# Patient Record
Sex: Female | Born: 1950
Health system: Southern US, Community
[De-identification: ages and names within clinical notes are randomized; demographics above are authoritative.]

## PROBLEM LIST (undated history)

## (undated) DIAGNOSIS — I5042 Chronic combined systolic (congestive) and diastolic (congestive) heart failure: Secondary | ICD-10-CM

## (undated) DIAGNOSIS — E785 Hyperlipidemia, unspecified: Secondary | ICD-10-CM

## (undated) DIAGNOSIS — I454 Nonspecific intraventricular block: Secondary | ICD-10-CM

## (undated) DIAGNOSIS — Z951 Presence of aortocoronary bypass graft: Secondary | ICD-10-CM

## (undated) DIAGNOSIS — F329 Major depressive disorder, single episode, unspecified: Secondary | ICD-10-CM

## (undated) DIAGNOSIS — Z95810 Presence of automatic (implantable) cardiac defibrillator: Secondary | ICD-10-CM

## (undated) DIAGNOSIS — M549 Dorsalgia, unspecified: Secondary | ICD-10-CM

## (undated) DIAGNOSIS — I10 Essential (primary) hypertension: Secondary | ICD-10-CM

## (undated) DIAGNOSIS — I255 Ischemic cardiomyopathy: Secondary | ICD-10-CM

## (undated) DIAGNOSIS — Z9981 Dependence on supplemental oxygen: Secondary | ICD-10-CM

## (undated) DIAGNOSIS — I739 Peripheral vascular disease, unspecified: Secondary | ICD-10-CM

## (undated) DIAGNOSIS — I208 Other forms of angina pectoris: Secondary | ICD-10-CM

## (undated) DIAGNOSIS — G629 Polyneuropathy, unspecified: Secondary | ICD-10-CM

## (undated) DIAGNOSIS — R7989 Other specified abnormal findings of blood chemistry: Secondary | ICD-10-CM

## (undated) DIAGNOSIS — G4730 Sleep apnea, unspecified: Secondary | ICD-10-CM

## (undated) DIAGNOSIS — I509 Heart failure, unspecified: Secondary | ICD-10-CM

## (undated) DIAGNOSIS — G4700 Insomnia, unspecified: Secondary | ICD-10-CM

## (undated) DIAGNOSIS — M1990 Unspecified osteoarthritis, unspecified site: Secondary | ICD-10-CM

## (undated) DIAGNOSIS — R945 Abnormal results of liver function studies: Secondary | ICD-10-CM

## (undated) DIAGNOSIS — M25562 Pain in left knee: Secondary | ICD-10-CM

## (undated) DIAGNOSIS — F419 Anxiety disorder, unspecified: Secondary | ICD-10-CM

## (undated) DIAGNOSIS — F32A Depression, unspecified: Secondary | ICD-10-CM

## (undated) DIAGNOSIS — I209 Angina pectoris, unspecified: Secondary | ICD-10-CM

## (undated) DIAGNOSIS — B369 Superficial mycosis, unspecified: Secondary | ICD-10-CM

## (undated) DIAGNOSIS — I472 Ventricular tachycardia: Secondary | ICD-10-CM

## (undated) DIAGNOSIS — M2550 Pain in unspecified joint: Secondary | ICD-10-CM

## (undated) DIAGNOSIS — I82409 Acute embolism and thrombosis of unspecified deep veins of unspecified lower extremity: Secondary | ICD-10-CM

## (undated) DIAGNOSIS — R0602 Shortness of breath: Secondary | ICD-10-CM

## (undated) DIAGNOSIS — B359 Dermatophytosis, unspecified: Secondary | ICD-10-CM

## (undated) DIAGNOSIS — E669 Obesity, unspecified: Secondary | ICD-10-CM

## (undated) DIAGNOSIS — H409 Unspecified glaucoma: Secondary | ICD-10-CM

## (undated) DIAGNOSIS — R600 Localized edema: Secondary | ICD-10-CM

## (undated) DIAGNOSIS — I2510 Atherosclerotic heart disease of native coronary artery without angina pectoris: Secondary | ICD-10-CM

## (undated) HISTORY — DX: Chronic combined systolic (congestive) and diastolic (congestive) heart failure: I50.42

## (undated) HISTORY — DX: Pain in left knee: M25.562

## (undated) HISTORY — DX: Localized edema: R60.0

## (undated) HISTORY — DX: Dorsalgia, unspecified: M54.9

## (undated) HISTORY — DX: Superficial mycosis, unspecified: B36.9

## (undated) HISTORY — DX: Heart failure, unspecified: I50.9

## (undated) HISTORY — DX: Obesity, unspecified: E66.9

## (undated) HISTORY — DX: Abnormal results of liver function studies: R94.5

## (undated) HISTORY — DX: Other specified abnormal findings of blood chemistry: R79.89

## (undated) HISTORY — DX: Depression, unspecified: F32.A

## (undated) HISTORY — DX: Peripheral vascular disease, unspecified: I73.9

## (undated) HISTORY — DX: Angina pectoris, unspecified: I20.9

## (undated) HISTORY — DX: Unspecified glaucoma: H40.9

## (undated) HISTORY — DX: Polyneuropathy, unspecified: G62.9

## (undated) HISTORY — DX: Ischemic cardiomyopathy: I25.5

## (undated) HISTORY — DX: Essential (primary) hypertension: I10

## (undated) HISTORY — DX: Insomnia, unspecified: G47.00

## (undated) HISTORY — DX: Anxiety disorder, unspecified: F41.9

## (undated) HISTORY — DX: Presence of aortocoronary bypass graft: Z95.1

## (undated) HISTORY — DX: Sleep apnea, unspecified: G47.30

## (undated) HISTORY — DX: Major depressive disorder, single episode, unspecified: F32.9

## (undated) HISTORY — DX: Dermatophytosis, unspecified: B35.9

## (undated) HISTORY — DX: Pain in unspecified joint: M25.50

## (undated) HISTORY — DX: Ventricular tachycardia: I47.2

## (undated) HISTORY — PX: CARDIAC CATHETERIZATION: SHX172

## (undated) HISTORY — DX: Nonspecific intraventricular block: I45.4

## (undated) HISTORY — DX: Atherosclerotic heart disease of native coronary artery without angina pectoris: I25.10

## (undated) HISTORY — DX: Other forms of angina pectoris: I20.8

## (undated) HISTORY — DX: Morbid (severe) obesity due to excess calories: E66.01

## (undated) HISTORY — DX: Hyperlipidemia, unspecified: E78.5

---
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
## 1997-04-20 ENCOUNTER — Ambulatory Visit (HOSPITAL_COMMUNITY): Admission: RE | Admit: 1997-04-20 | Discharge: 1997-04-20 | Payer: Self-pay | Admitting: Cardiology

## 1998-10-24 ENCOUNTER — Other Ambulatory Visit: Admission: RE | Admit: 1998-10-24 | Discharge: 1998-10-24 | Payer: Self-pay | Admitting: Gynecology

## 1998-10-24 ENCOUNTER — Encounter (INDEPENDENT_AMBULATORY_CARE_PROVIDER_SITE_OTHER): Payer: Self-pay | Admitting: Specialist

## 1999-04-19 ENCOUNTER — Ambulatory Visit (HOSPITAL_COMMUNITY): Admission: RE | Admit: 1999-04-19 | Discharge: 1999-04-19 | Payer: Self-pay | Admitting: Cardiology

## 1999-05-23 ENCOUNTER — Encounter (HOSPITAL_COMMUNITY): Admission: RE | Admit: 1999-05-23 | Discharge: 1999-06-04 | Payer: Self-pay | Admitting: Cardiology

## 1999-06-05 ENCOUNTER — Encounter (HOSPITAL_COMMUNITY): Admission: RE | Admit: 1999-06-05 | Discharge: 1999-07-06 | Payer: Self-pay | Admitting: Cardiology

## 2001-02-03 ENCOUNTER — Other Ambulatory Visit: Admission: RE | Admit: 2001-02-03 | Discharge: 2001-02-03 | Payer: Self-pay | Admitting: Gynecology

## 2001-10-18 ENCOUNTER — Emergency Department (HOSPITAL_COMMUNITY): Admission: EM | Admit: 2001-10-18 | Discharge: 2001-10-18 | Payer: Self-pay | Admitting: *Deleted

## 2001-10-18 ENCOUNTER — Encounter: Payer: Self-pay | Admitting: *Deleted

## 2002-02-06 ENCOUNTER — Emergency Department (HOSPITAL_COMMUNITY): Admission: EM | Admit: 2002-02-06 | Discharge: 2002-02-06 | Payer: Self-pay

## 2003-11-29 ENCOUNTER — Emergency Department (HOSPITAL_COMMUNITY): Admission: EM | Admit: 2003-11-29 | Discharge: 2003-11-30 | Payer: Self-pay | Admitting: Emergency Medicine

## 2004-02-08 ENCOUNTER — Emergency Department (HOSPITAL_COMMUNITY): Admission: EM | Admit: 2004-02-08 | Discharge: 2004-02-08 | Payer: Self-pay | Admitting: Family Medicine

## 2005-08-20 ENCOUNTER — Emergency Department (HOSPITAL_COMMUNITY): Admission: EM | Admit: 2005-08-20 | Discharge: 2005-08-20 | Payer: Self-pay | Admitting: Family Medicine

## 2005-10-03 ENCOUNTER — Inpatient Hospital Stay (HOSPITAL_COMMUNITY): Admission: EM | Admit: 2005-10-03 | Discharge: 2005-10-11 | Payer: Self-pay | Admitting: Emergency Medicine

## 2005-10-03 ENCOUNTER — Encounter (INDEPENDENT_AMBULATORY_CARE_PROVIDER_SITE_OTHER): Payer: Self-pay | Admitting: Cardiology

## 2005-10-04 ENCOUNTER — Encounter (INDEPENDENT_AMBULATORY_CARE_PROVIDER_SITE_OTHER): Payer: Self-pay | Admitting: *Deleted

## 2005-10-05 DIAGNOSIS — Z951 Presence of aortocoronary bypass graft: Secondary | ICD-10-CM | POA: Insufficient documentation

## 2005-10-05 HISTORY — DX: Presence of aortocoronary bypass graft: Z95.1

## 2005-10-05 HISTORY — PX: CORONARY ARTERY BYPASS GRAFT: SHX141

## 2005-10-25 ENCOUNTER — Encounter (HOSPITAL_COMMUNITY): Admission: RE | Admit: 2005-10-25 | Discharge: 2006-01-23 | Payer: Self-pay | Admitting: Cardiology

## 2006-01-24 ENCOUNTER — Encounter (HOSPITAL_COMMUNITY): Admission: RE | Admit: 2006-01-24 | Discharge: 2006-04-23 | Payer: Self-pay | Admitting: Cardiology

## 2006-04-09 HISTORY — PX: CARDIAC DEFIBRILLATOR PLACEMENT: SHX171

## 2006-04-26 ENCOUNTER — Encounter: Admission: RE | Admit: 2006-04-26 | Discharge: 2006-04-26 | Payer: Self-pay | Admitting: Cardiology

## 2006-05-01 ENCOUNTER — Inpatient Hospital Stay (HOSPITAL_COMMUNITY): Admission: RE | Admit: 2006-05-01 | Discharge: 2006-05-02 | Payer: Self-pay | Admitting: Cardiology

## 2008-02-03 ENCOUNTER — Emergency Department (HOSPITAL_COMMUNITY): Admission: EM | Admit: 2008-02-03 | Discharge: 2008-02-03 | Payer: Self-pay | Admitting: Emergency Medicine

## 2008-07-08 ENCOUNTER — Emergency Department (HOSPITAL_COMMUNITY): Admission: EM | Admit: 2008-07-08 | Discharge: 2008-07-08 | Payer: Self-pay | Admitting: Emergency Medicine

## 2009-03-07 ENCOUNTER — Encounter: Admission: RE | Admit: 2009-03-07 | Discharge: 2009-03-07 | Payer: Self-pay | Admitting: Internal Medicine

## 2009-03-24 ENCOUNTER — Encounter: Admission: RE | Admit: 2009-03-24 | Discharge: 2009-03-24 | Payer: Self-pay | Admitting: Internal Medicine

## 2009-03-24 ENCOUNTER — Other Ambulatory Visit: Admission: RE | Admit: 2009-03-24 | Discharge: 2009-03-24 | Payer: Self-pay | Admitting: Interventional Radiology

## 2009-10-24 ENCOUNTER — Emergency Department (HOSPITAL_COMMUNITY): Admission: EM | Admit: 2009-10-24 | Discharge: 2009-10-24 | Payer: Self-pay | Admitting: Emergency Medicine

## 2009-12-18 ENCOUNTER — Inpatient Hospital Stay (HOSPITAL_COMMUNITY)
Admission: EM | Admit: 2009-12-18 | Discharge: 2009-12-22 | Payer: Self-pay | Source: Home / Self Care | Attending: Internal Medicine | Admitting: Internal Medicine

## 2009-12-19 ENCOUNTER — Encounter (INDEPENDENT_AMBULATORY_CARE_PROVIDER_SITE_OTHER): Payer: Self-pay | Admitting: Internal Medicine

## 2009-12-20 ENCOUNTER — Ambulatory Visit (HOSPITAL_COMMUNITY)
Admission: RE | Admit: 2009-12-20 | Discharge: 2009-12-20 | Payer: Self-pay | Source: Home / Self Care | Attending: Cardiology | Admitting: Cardiology

## 2010-01-15 ENCOUNTER — Emergency Department (HOSPITAL_COMMUNITY)
Admission: EM | Admit: 2010-01-15 | Discharge: 2010-01-15 | Payer: Self-pay | Source: Home / Self Care | Admitting: Family Medicine

## 2010-01-24 ENCOUNTER — Ambulatory Visit (HOSPITAL_BASED_OUTPATIENT_CLINIC_OR_DEPARTMENT_OTHER)
Admission: RE | Admit: 2010-01-24 | Discharge: 2010-01-24 | Payer: Self-pay | Source: Home / Self Care | Attending: Internal Medicine | Admitting: Internal Medicine

## 2010-02-17 ENCOUNTER — Other Ambulatory Visit: Payer: Self-pay | Admitting: Internal Medicine

## 2010-02-17 DIAGNOSIS — E049 Nontoxic goiter, unspecified: Secondary | ICD-10-CM

## 2010-02-24 ENCOUNTER — Other Ambulatory Visit: Payer: Self-pay

## 2010-02-24 ENCOUNTER — Ambulatory Visit
Admission: RE | Admit: 2010-02-24 | Discharge: 2010-02-24 | Disposition: A | Discharge status: Home / Self Care | Payer: Medicaid Other | Source: Ambulatory Visit | Attending: Internal Medicine | Admitting: Internal Medicine

## 2010-02-24 DIAGNOSIS — E049 Nontoxic goiter, unspecified: Secondary | ICD-10-CM

## 2010-03-20 LAB — BASIC METABOLIC PANEL
BUN: 21 mg/dL (ref 6–23)
BUN: 11 mg/dL (ref 6–23)
BUN: 15 mg/dL (ref 6–23)
BUN: 9 mg/dL (ref 6–23)
CO2: 28 mEq/L (ref 19–32)
CO2: 24 mEq/L (ref 19–32)
CO2: 28 mEq/L (ref 19–32)
CO2: 28 mEq/L (ref 19–32)
Calcium: 9.5 mg/dL (ref 8.4–10.5)
Calcium: 9 mg/dL (ref 8.4–10.5)
Calcium: 9.1 mg/dL (ref 8.4–10.5)
Calcium: 9.1 mg/dL (ref 8.4–10.5)
Calcium: 9.3 mg/dL (ref 8.4–10.5)
Chloride: 104 mEq/L (ref 96–112)
Creatinine, Ser: 1.12 mg/dL (ref 0.4–1.2)
Creatinine, Ser: 0.82 mg/dL (ref 0.4–1.2)
GFR calc Af Amer: >60 mL/min (ref >60)
GFR calc non Af Amer: 50 mL/min — ABNORMAL LOW (ref >60)
GFR calc non Af Amer: 57 mL/min — ABNORMAL LOW (ref >60)
GFR calc non Af Amer: >60 mL/min (ref >60)
GFR calc non Af Amer: >60 mL/min (ref >60)
Glucose, Bld: 108 mg/dL — ABNORMAL HIGH (ref 70–99)
Glucose, Bld: 112 mg/dL — ABNORMAL HIGH (ref 70–99)
Glucose, Bld: 173 mg/dL — ABNORMAL HIGH (ref 70–99)
Glucose, Bld: 232 mg/dL — ABNORMAL HIGH (ref 70–99)
Potassium: 3.7 mEq/L (ref 3.5–5.1)
Potassium: 4.4 mEq/L (ref 3.5–5.1)
Sodium: 133 mEq/L — ABNORMAL LOW (ref 135–145)
Sodium: 139 mEq/L (ref 135–145)

## 2010-03-20 LAB — POCT CARDIAC MARKERS
CKMB, poc: 1.5 ng/mL (ref 1.0–8.0)
Myoglobin, poc: 83.7 ng/mL (ref 12–200)
Troponin i, poc: <0.05 ng/mL (ref 0.00–0.09)

## 2010-03-20 LAB — GLUCOSE, CAPILLARY
Glucose-Capillary: 129 mg/dL — ABNORMAL HIGH (ref 70–99)
Glucose-Capillary: 164 mg/dL — ABNORMAL HIGH (ref 70–99)
Glucose-Capillary: 180 mg/dL — ABNORMAL HIGH (ref 70–99)
Glucose-Capillary: 208 mg/dL — ABNORMAL HIGH (ref 70–99)
Glucose-Capillary: 248 mg/dL — ABNORMAL HIGH (ref 70–99)
Glucose-Capillary: 250 mg/dL — ABNORMAL HIGH (ref 70–99)
Glucose-Capillary: 305 mg/dL — ABNORMAL HIGH (ref 70–99)
Glucose-Capillary: 91 mg/dL (ref 70–99)

## 2010-03-20 LAB — CARDIAC PANEL(CRET KIN+CKTOT+MB+TROPI)
CK, MB: 1.6 ng/mL (ref 0.3–4.0)
Relative Index: 1.5 (ref 0.0–2.5)
Total CK: 108 U/L (ref 7–177)
Troponin I: 0.02 ng/mL (ref 0.00–0.06)

## 2010-03-20 LAB — URINALYSIS, ROUTINE W REFLEX MICROSCOPIC
Hgb urine dipstick: NEGATIVE
Protein, ur: NEGATIVE mg/dL
Protein, ur: NEGATIVE mg/dL
Specific Gravity, Urine: 1.016 (ref 1.005–1.030)
Urobilinogen, UA: 0.2 mg/dL (ref 0.0–1.0)
Urobilinogen, UA: 0.2 mg/dL (ref 0.0–1.0)

## 2010-03-20 LAB — TROPONIN I: Troponin I: 0.03 ng/mL (ref 0.00–0.06)

## 2010-03-20 LAB — URINE CULTURE: Special Requests: NEGATIVE

## 2010-03-20 LAB — DIFFERENTIAL
Basophils Relative: 0 % (ref 0–1)
Eosinophils Absolute: 0.1 10*3/uL (ref 0.0–0.7)
Eosinophils Relative: 2 % (ref 0–5)
Lymphs Abs: 3 10*3/uL (ref 0.7–4.0)
Monocytes Relative: 8 % (ref 3–12)
Neutro Abs: 3.9 10*3/uL (ref 1.7–7.7)
Neutrophils Relative %: 51 % (ref 43–77)
Neutrophils Relative %: 51 % (ref 43–77)

## 2010-03-20 LAB — URINE MICROSCOPIC-ADD ON

## 2010-03-20 LAB — LIPID PANEL
HDL: 28 mg/dL — ABNORMAL LOW (ref >39)
Total CHOL/HDL Ratio: 4.6 RATIO
VLDL: 19 mg/dL (ref 0–40)

## 2010-03-20 LAB — CBC
HCT: 39 % (ref 36.0–46.0)
HCT: 40.2 % (ref 36.0–46.0)
Hemoglobin: 13.4 g/dL (ref 12.0–15.0)
MCH: 30.6 pg (ref 26.0–34.0)
MCH: 30.9 pg (ref 26.0–34.0)
MCH: 31 pg (ref 26.0–34.0)
MCHC: 32.8 g/dL (ref 30.0–36.0)
MCHC: 33.1 g/dL (ref 30.0–36.0)
MCHC: 33.3 g/dL (ref 30.0–36.0)
MCV: 93.1 fL (ref 78.0–100.0)
Platelets: 181 10*3/uL (ref 150–400)
Platelets: 211 10*3/uL (ref 150–400)
RBC: 4.38 MIL/uL (ref 3.87–5.11)
RDW: 14.2 % (ref 11.5–15.5)
RDW: 14.2 % (ref 11.5–15.5)
WBC: 6.9 10*3/uL (ref 4.0–10.5)

## 2010-03-20 LAB — BRAIN NATRIURETIC PEPTIDE
Pro B Natriuretic peptide (BNP): 266 pg/mL — ABNORMAL HIGH (ref 0.0–100.0)
Pro B Natriuretic peptide (BNP): <30 pg/mL (ref 0.0–100.0)

## 2010-03-20 LAB — CK TOTAL AND CKMB (NOT AT ARMC): Relative Index: 1.7 (ref 0.0–2.5)

## 2010-03-20 LAB — MAGNESIUM: Magnesium: 2.2 mg/dL (ref 1.5–2.5)

## 2010-03-20 LAB — HEMOGLOBIN A1C: Mean Plasma Glucose: 206 mg/dL — ABNORMAL HIGH (ref <117)

## 2010-03-20 LAB — PROTIME-INR
INR: 1.09 (ref 0.00–1.49)
Prothrombin Time: 14.3 seconds (ref 11.6–15.2)

## 2010-03-22 LAB — POCT CARDIAC MARKERS
CKMB, poc: <1 ng/mL — ABNORMAL LOW (ref 1.0–8.0)
Myoglobin, poc: 88.5 ng/mL (ref 12–200)

## 2010-03-22 LAB — POCT I-STAT, CHEM 8
Creatinine, Ser: 0.8 mg/dL (ref 0.4–1.2)
HCT: 45 % (ref 36.0–46.0)
Hemoglobin: 15.3 g/dL — ABNORMAL HIGH (ref 12.0–15.0)
Potassium: 3.5 mEq/L (ref 3.5–5.1)
Sodium: 141 mEq/L (ref 135–145)
TCO2: 26 mmol/L (ref 0–100)

## 2010-03-22 LAB — GLUCOSE, CAPILLARY: Glucose-Capillary: 208 mg/dL — ABNORMAL HIGH (ref 70–99)

## 2010-03-22 LAB — BRAIN NATRIURETIC PEPTIDE: Pro B Natriuretic peptide (BNP): 49 pg/mL (ref 0.0–100.0)

## 2010-03-24 ENCOUNTER — Encounter (INDEPENDENT_AMBULATORY_CARE_PROVIDER_SITE_OTHER): Payer: Self-pay | Admitting: *Deleted

## 2010-03-28 NOTE — Letter (Signed)
Summary: Appointment - Reminder 2  [employer], Main Office  [address]   [place], [place] [address]   Phone: [phone]  Fax: [phone]     March 24, 2010 MRN: [id number]   [name] [address] [place], [place]  [address]   Dear [name],  Our records indicate that it is time to schedule a follow-up appointment.  Dr.Allred recommended that you follow up with [name] in May. It is very important that we reach you to schedule this appointment. We look forward to participating in your health care needs. Please contact us at the number listed above at your earliest convenience to schedule your appointment.  If you are unable to make an appointment at this time, give [name] a call so we can update our records.     Sincerely,   Glass blower/designer

## 2010-04-24 LAB — CBC
MCHC: 32.8 g/dL (ref 30.0–36.0)
MCV: 90.8 fL (ref 78.0–100.0)
RBC: 4.63 MIL/uL (ref 3.87–5.11)
RDW: 13.8 % (ref 11.5–15.5)

## 2010-04-24 LAB — DIFFERENTIAL
Eosinophils Relative: 2 % (ref 0–5)
Lymphocytes Relative: 19 % (ref 12–46)
Lymphs Abs: 1.1 10*3/uL (ref 0.7–4.0)
Monocytes Relative: 8 % (ref 3–12)
Neutrophils Relative %: 71 % (ref 43–77)

## 2010-04-24 LAB — COMPREHENSIVE METABOLIC PANEL
AST: 20 U/L (ref 0–37)
CO2: 27 mEq/L (ref 19–32)
Calcium: 9 mg/dL (ref 8.4–10.5)
Creatinine, Ser: 1.13 mg/dL (ref 0.4–1.2)
GFR calc Af Amer: >60 mL/min (ref >60)
GFR calc non Af Amer: 50 mL/min — ABNORMAL LOW (ref >60)
Total Protein: 7.5 g/dL (ref 6.0–8.3)

## 2010-04-24 LAB — URINALYSIS, ROUTINE W REFLEX MICROSCOPIC
Ketones, ur: 15 mg/dL — AB
Nitrite: NEGATIVE
Protein, ur: 30 mg/dL — AB

## 2010-04-24 LAB — POCT CARDIAC MARKERS
Myoglobin, poc: 187 ng/mL (ref 12–200)
Troponin i, poc: <0.05 ng/mL (ref 0.00–0.09)

## 2010-05-26 NOTE — Cardiovascular Report (Signed)
[facility]  Patient:    [name], [name]                         MRN: [id number] Proc. Date: 04/30/99 Attending:  Aram Candela. Aleen Campi, M.D. CC:         Aram Candela. Tysinger, M.D. (office)             Cath. Lab. @ [facility]                        Cardiac Catheterization  REFERRING PHYSICIAN:  Dr. Aggie Cosier.  SURGEON:  Dr. Charolette Child.  PROCEDURES: 1. Left heart catheterization. 2. Coronary cineangiography. 3. Left ventricular cineangiography. 4. Abdominal aortic angiogram. 5. Perclose of the right femoral artery.  INDICATIONS FOR PROCEDURES:  This 60 year old diabetic female has a history of single-vessel coronary artery disease documented with prior cardiac catheterization in 1995 and 1999.  At both occasions, she had diffuse severe disease of her mid- and distal LAD and was continued on medical therapy.  Her last catheterization in 1999 showed a 50% lesion in her distal right coronary artery.  She now returns after continuation of her angina and a stress test showed evidence for a new area of reversible ischemia in her inferior myocardium.  She also has a history of hypertension.  DESCRIPTION OF THE PROCEDURE:  After signing an informed consent, the patient was premedicated with 50 mg of Benadryl intravenously and brought to the cardiac catheterization lab.  Her right groin was prepped and draped in a sterile fashion and anesthetized locally with 1% lidocaine.  A #6 French introducer sheath was inserted percutaneously into the right femoral artery. A #6 [place] #4 Judkins coronary catheters were used to make injections in to the coronary arteries.  A #6 French pigtail catheter was used to measure pressures in the left ventricle and aorta and to make midstream injections in to the left ventricle and abdominal aorta.  The patient tolerated the procedure well and no complications were noted.  At the end of the procedure, the catheter and  sheath were removed from the right femoral artery and hemostasis was easily obtained with a Perclose closure system.  MEDICATIONS GIVEN:  None.  HEMODYNAMIC DATA:  Left ventricular pressure 164/0-18, aortic pressure 100/64-88 with a mean of 118.  Left ventricular ejection fraction was estimated at between 50 and 60%.  CINE FINDINGS:  CINEANGIOGRAPHY:  LEFT CORONARY ARTERY:  The ostium and left main appear normal.  LEFT ANTERIOR DESCENDING:  There is diffuse disease throughout the mid- and distal LAD extending almost to the apex.  There is a focal 80% stenosis in the middle segment after the second anterolateral branch.  This is followed by a segmental severe 90-95% stenosis throughout the remainder of the middle segment and with several areas of 80-90% in the distal segment.  There appears to be a short area of relative disease-free LAD in the mid- to distal segment. There also appears to be collateral flow from a diagonal to an apical region.  CIRCUMFLEX CORONARY ARTERY:  The circumflex appears normal.  The first obtuse marginal branch has mild lesion of approximately 20%.  RIGHT CORONARY ARTERY:  The ostium appears normal.  There is minor irregularities in the middle segment.  The distal segment between the acute angle and posterior descending has a focal 50-60% stenosis.  The distal segment near the crux between the first and second anterolateral branches  has a focal 80-90% stenosis.  LEFT VENTRICULAR CINEANGIOGRAPHY:  The left ventricular chamber size and contractility appear normal.  The ejection fraction was estimated at between 50 and 60%.  The mitral and aortic valves appear normal.  ABDOMINAL AORTIC ANGIOGRAM:  The abdominal and aorta and renal arteries appear normal.  FINAL DIAGNOSES: 1. Two-vessel coronary artery disease with severe and critical diffuse    stenosis of the mid- and distal left anterior descending and severe    stenosis, 80%, of the distal right  coronary artery. 2. Good left ventricular function. 3. Normal abdominal aorta and renal arteries. 4. Successful Perclose of the right femoral artery.  DISPOSITION:  Will ask CVTS to review and await their recommendation concerning possible coronary artery bypass graft surgery.  Otherwise will continue medical treatment at this time. DD:  04/19/99 TD:  04/20/99 Job: [id number]

## 2010-05-26 NOTE — Discharge Summary (Signed)
[name], [name] NO.:  [id number]   MEDICAL RECORD NO.:  [id number]          PATIENT TYPE:  INP   LOCATION:  4710                         FACILITY:  [facility]   PHYSICIAN:  Francisca December, M.D.  DATE OF BIRTH:  22-Mar-1950   DATE OF ADMISSION:  05/01/2006  DATE OF DISCHARGE:  05/02/2006                               DISCHARGE SUMMARY   DISCHARGE DIAGNOSES:  1. Ischemic cardiomyopathy, MADIT-2 criteria, status post AICD      implantation on May 01, 2006.  2. Known coronary artery disease with history of coronary artery      bypass grafting in 2007 with the following grafts:  LIMA to the      second diagonal, saphenous vein graft to the RCA, saphenous vein      graft to the right coronary, sequential saphenous vein graft to OM1      and OM2.  3. History of anterior wall myocardial infarction.  4. Hypertension.  5. Diabetes mellitus.  6. Hyperlipidemia.  7. Long-term medication use.   Ms. Wolfer is a well-known patient to our practice with significant  coronary artery disease.  She has undergone bypass surgery and indeed  has an ischemic cardiomyopathy.  She was seen in consultation by Dr.  Amil Amen who found that she would need an AICD under the MADIT-2  criteria.   The patient was admitted on April 30, 2006 and underwent dual chamber  ICD implantation, [employer]tronic type, tolerating it well.  The patient  remained in the hospital overnight.  The ICD was evaluated and was  normal.  The patient had an x-ray done that showed no pneumothorax and  was discharged to home in stable condition.   DISCHARGE MEDICATIONS:  1. Enteric coated aspirin 325 mg a day.  2. Norvasc 5 mg a day.  3. Carvedilol 25 mg b.i.d.  4. Vytorin 10/40 one daily.  5. Fish oil daily.  6. Iron daily.  7. Narcan p.r.n.  8. Lasix 40 mg a day.  9. Benazepril 40 mg a day.  10.Zofran 4 mg every 6 hours p.r.n. nausea.   Activity care and wound care sheets were given to the patient.  She  will  remain on a low-sodium, heart-healthy diet and follow up with Dr.  Clydene Laming on May 10, 2006 at 10:30 a.m.  The patient is discharged to  home in stable but improved condition.      Guy Franco, P.A.      Francisca December, M.D.  Electronically Signed    LB/MEDQ  D:  06/24/2006  T:  06/24/2006  Job:  [id number]   cc:   Francisca December, M.D.  Georgann Housekeeper, MD

## 2010-05-26 NOTE — Op Note (Signed)
NAMETAMURA, LASKY NO.:  [id number]   MEDICAL RECORD NO.:  [id number]          PATIENT TYPE:  INP   LOCATION:  2313                         FACILITY:  [facility]   PHYSICIAN:  Evelene Croon, M.D.     DATE OF BIRTH:  12-17-1950   DATE OF PROCEDURE:  10/05/2005  DATE OF DISCHARGE:                                 OPERATIVE REPORT   PREOPERATIVE DIAGNOSIS:  Severe three-vessel coronary artery disease, status  post acute anterior myocardial infarction.   POSTOPERATIVE DIAGNOSIS:  Severe three-vessel coronary artery disease,  status post acute anterior myocardial infarction.   OPERATIVE PROCEDURE:  Median sternotomy, extracorporeal circulation,  coronary bypass graft surgery x5, using a left internal mammary artery graft  to the second diagonal branch of the LAD, a saphenous vein graft to the  distal right coronary artery, a saphenous vein graft to the intermediate  coronary artery, and a sequential saphenous vein graft to the first and  second obtuse marginal branches of the left circumflex coronary artery.  Endoscopic vein harvesting from both legs.   SURGEON:  Evelene Croon, M.D.   ASSISTANT:  Salvatore Decent. Cornelius Moras, M.D.   SECOND ASSISTANT:  Jerold Coombe, P.A.-C.   ANESTHESIA:  General endotracheal.   CLINICAL HISTORY:  This patient is a 60 year old woman with diabetes and  hypertension that are poorly controlled, who has a history of coronary  artery disease, status post multiple prior catheterizations which have shown  severe LAD and right coronary disease.  She has been treated medically.  She  was admitted after developing acute onset of chest pain and dyspnea  awakening her about 3:00 in the morning.  The chest pain continued about 4  hours before she came to the emergency room, where the pain was relieved  with sublingual nitroglycerin.  She ruled in for acute anterior MI, with CPK  of 213, with MB of 16.9 and troponin of 1.63.  The glucose on  presentation  was 397.  She had new Q waves on her EKG anteriorly compared to 2005.  She  underwent cardiac catheterization which showed severe diffuse three-vessel  coronary artery disease.  The LAD was severely diseased throughout.  There  was a small first diagonal branch and then a larger branching second  diagonal which had an 80% ostial stenosis.  Both sub-branches appeared to  communicate with each other.  This was a main supply to the anterolateral  wall.  The distal LAD had a small third diagonal and then was completely  occluded, with faint filling of the apical portion by collaterals.  The  entire LAD did not appear graftable.  There was also a moderate-sized  intermediate vessel that had about 95% proximal stenosis.  There were two  other marginal branches that had significant stenoses.  The right coronary  artery was occluded, with filling of the distal vessel by collaterals from  the left.  Left ventricular ejection fraction was about 30%.  End-diastolic  pressure was elevated to about 38.  After review of the angiogram and  examination of the patient, it was felt that coronary  artery bypass graft  surgery was the best treatment to prevent further ischemia and infarction.  I discussed the operative procedure with the patient and her daughter,  including alternatives, benefits, and risks, including bleeding, blood  transfusion, infection, stroke, myocardial infarction, graft failure, organ  failure, and death.  I also discussed the increased risks due to her  hemoglobin A1c, which was over 14.  I discussed the importance of cardiac  risk factor reduction, including good control of her diabetes and  hypertension, as well as control of hyperlipidemia and weight loss.  She  understood all of this and agreed to proceed with surgery.   OPERATIVE PROCEDURE:  The patient was taken to the operating room and placed  on the table in the supine position.  After induction of general   endotracheal anesthesia, a Foley catheter was placed in the bladder in  sterile technique.  Then, the chest, abdomen, and both lower extremities  were prepped and draped in the usual sterile manner.  Transesophageal  echocardiogram was performed by anesthesiology.  This showed trace mitral  regurgitation.  There was severe left ventricular dysfunction, with global  hypokinesis.  There was left ventricular dilatation.  The ejection fraction  was estimated at about 25%.  The patient had moderate pulmonary hypertension  prior to anesthesia, which gradually improved after being put to sleep.  The  aortic valve appeared to have no stenosis or regurgitation.  The right heart  was functioning normally, with no tricuspid regurgitation.   Then, the chest was opened through a median sternotomy incision in the  pericardium at the midline.  Examination of the heart showed a large heart  that filled the pericardial cavity.  The right ventricle appeared to  contract well.  The left ventricle was not visible.  The ascending aorta had  no palpable plaques in it.   Then, the left internal mammary artery was harvested from the chest wall as  a pedicle graft.  This was a medium-caliber vessel, with excellent blood  flow through it.  At the same time, a segment of greater saphenous vein was  harvested from the right leg using endoscopic vein harvest technique.  Part  of this vein was suitable, but most of it was thick-walled and would not  dilate, and it was not felt to be suitable.  Therefore, another section of  saphenous vein was harvested from the left leg using endoscopic vein harvest  technique, and this vein was of better quality and good size.   Then, the patient was heparinized. When an adequate activated clotting time  was achieved, the distal ascending aorta was cannulated using a 20-French  aortic cannula for arterial inflow.  Venous outflow was achieved using a two- stage venous cannula at the  right atrial appendage.  An antegrade  cardioplegia and vent cannula was inserted in the aortic root.  A retrograde  cardioplegia cannula was inserted through the right atrium into the coronary  sinus.   The patient was placed on cardiopulmonary bypass, and the distal coronaries  were identified.  The LAD was severely and diffusely diseased and not  graftable.  The first diagonal was a small, non-graftable vessel.  The  second diagonal had two sub-branches.  The more lateral branch was actually  a little larger.  There was mild distal disease in both sub-branches.  The  intermediate vessel was heavily diseased proximally, and a moderate-size  vessel distally where it was graftable before it came intramyocardial.  The  first  and second marginal were moderate-size graftable vessels.  The right  coronary was diffusely diseased.  There was a small posterior descending,  and then a larger posterolateral branch which was suitable for grafting.  The posterior descending itself was severely and diffusely diseased.   Then, the aorta was cross-clamped, and 500 cc of cold blood antegrade  cardioplegia was administered in the aortic root, with quick arrest of the  heart.  This was followed by 300 cc of cold blood retrograde cardioplegia.  Additional doses of retrograde cardioplegia were given at about 20-minute  intervals to maintain myocardial temperature at around 10 degrees  centigrade.  Systemic hypothermia to 28 degrees centigrade and topical  hypothermic iced saline was used.  A temperature probe was placed in the  septum and an insulating pad in the pericardium.   The first distal anastomosis was performed to the posterolateral branch of  the right coronary artery.  The internal diameter of this vessel was about  1.6 mm.  the conduit used was a segment of greater saphenous vein.  The  anastomosis was performed in an end-to-side manner using a continuous 7-0  Prolene suture.  Flow was noted  through the graft and was excellent.   The second distal anastomosis was performed to the inner intermediate  coronary.  The internal diameter was about 1.5 mm.  The conduit used was a  second segment of greater saphenous vein.  The anastomosis was performed in  an end-to-side manner using continuous 7-0 Prolene suture.  Flow was noted  through this graft and was excellent.   A third distal anastomosis was performed to the first marginal branch.  The  internal diameter was 1.5 mm.  The conduit used was a third segment of  greater saphenous vein.  The anastomosis was performed in a sequential side-  to-side manner continuous 7-0 Prolene suture.  Flow was noted through the  graft and was excellent.   The fourth distal anastomosis was performed to the second marginal branch.  The internal diameter was also 1.5 mm.  The conduit used was the same  segment of greater saphenous vein, and the anastomosis was performed in a sequential end-to-side manner continuous 7-0 Prolene suture.  Flow was noted  through the graft and was excellent.  Then, another dose of cardioplegia was  given down vein grafts and in a retrograde manner.   The fifth distal anastomosis was performed to the second diagonal branch.  The internal diameter was 1.75 mm.  The conduit used was the left internal  mammary graft, and this was brought through an opening in the left  pericardium anterior to the phrenic nerve.  It was anastomosed to the LAD in  an end-to-side manner using continuous 8-0 Prolene suture.  The pedicle was  sutured to the epicardium with 6-0 Prolene sutures.  The patient was  rewarmed to 37 degrees centigrade.  With the cross-clamp in place, the three  proximal vein graft anastomoses were performed to the aortic root in an end-  to-side manner using continuous 6-0 Prolene suture.  Then, the clamp was  removed from the mammary pedicle.  The cross-clamp was removed at a time of  101 minutes.  There was  spontaneous return of ventricular fibrillation.  The  patient was defibrillated into sinus rhythm.   The proximal and distal anastomoses appeared hemostatic and __________  satisfactory.  Graft markers were placed around the proximal anastomoses.  Two temporary right ventricular and atrial pacing wires were placed and  brought out through the skin.   When the patient had rewarmed to 37 degrees centigrade, she was weaned from  cardiopulmonary bypass on low-dose dopamine and milrinone.  Total bypass  time was 131 minutes.  A transesophageal echocardiogram showed the left  ventricular function slightly improved.  There was trace mitral  regurgitation.  Cardiac output was 5 liters per minute.  Protamine was  given, and the venous and aortic cannulas were removed without difficulty.  Hemostasis was achieved.  Three chest tubes were placed, with a tube in the  post pericardium, one in the left pleural space, and one in the anterior  mediastinum.  The sternum was then closed with double #6 stainless steel  wires.  Fascia was closed with continuous #1 Vicryl suture.  Subcutaneous  tissue was closed with continuous 2-0 Vicryl, and the skin with a 3-0 Vicryl  subcuticular closure.  The lower extremity vein harvest sites were closed in  layers in a similar manner.  The sponge, needle, and instrument counts were  correct according to the scrub nurse.  Dry sterile dressings were applied  over the incisions around the chest tubes which were hooked to Pleur-Evac  suction.  The patient remained hemodynamically stable and was transported to  the SICU in guarded but stable condition.      Evelene Croon, M.D.  Electronically Signed     BB/MEDQ  D:  10/05/2005  T:  10/08/2005  Job:  [id number]   cc:   Francisca December, M.D.  CVTS Office  Cardiac Catheterization Lab

## 2010-05-26 NOTE — Op Note (Signed)
NAMEKAITRIN, Jamie NO.:  [id number]   MEDICAL RECORD NO.:  [id number]          PATIENT TYPE:  INP   LOCATION:  2313                         FACILITY:  [facility]   PHYSICIAN:  Burna Forts, M.D.DATE OF BIRTH:  [birth year]   DATE OF PROCEDURE:  10/05/2005  DATE OF DISCHARGE:                                 OPERATIVE REPORT   PROCEDURE:  Intraoperative transesophageal echocardiography.   INDICATIONS FOR PROCEDURE:  [name] is a 60 year old female who presents  today for coronary artery bypass grafting.  She is known to have  significantly diminished cardiac function and we have been asked to place a  TEE probe for evaluation of cardiac structures and function.  On arrival to  the OR and after induction of general anesthesia, the TEE probe was  lubricated and protected and passed oropharyngeally into stomach, and  prepared for imaging of the cardiac structures.   PRE CARDIOPULMONARY BYPASS TEE EXAMINATION:  Left ventricle:  This is a  remarkably diminished left ventricular chamber seen in a short-axis view.  There is a dilated left ventricular end-diastolic volume area noted.  Estimated ejection fraction is only about 28% to 30%.  There are hypokinetic  areas and nearly akinetic areas anteriorly, hypokinetic areas intraseptally,  inferior and septally.  Long-axis views from the short-axis area also  reveals significantly diminished to nearly akinetic anterior wall, severely  hypokinetic posterior wall, and hypokinetic apical area of this heart.  Papillary muscles are well outlined.  There are no other masses noted  within.   Mitral valve:  This is thickened mitral valve apparatus, degenerative in  appearance slightly.  The leaflets are, both the anterior and posterior,  pretty coapt appropriately during systolic ejection and move appropriately.  Pulsed wave Doppler reveals severe E to A reversal, indicating nearly  restrictive end-flow and diminished LV  end-diastolic function.  Doppler  examination across this valve reveals trace mitral regurgitant flow seen in  several views.   Left atrium:  Left atrial chamber is somewhat enlarged.  There is a  significantly bulging aspect of the interatrial septum bulging left to  right.  Some slight smoke appears in the left atrial chamber itself  and  the left atrial appendage is interrogated and is clear of any masses or  structures.   Aortic valve:  Three-cusp, normal aortic valve.   Right ventricle:  Tricuspid valve and right atrium were essentially within  normal limits, with just a mild amount of tricuspid regurgitant flow noted.   Patient is placed on cardiopulmonary bypass.  Hypothermia was begun.  Coronary artery bypass grafting is carried out by Evelene Croon.  The patient  is re-warmed.  Inotropes are begun and patient separated from  cardiopulmonary bypass with the initial attempt.   POST CARDIOPULMONARY TEE EXAMINATION:  Left ventricle:  The left ventricular  chamber is seen in both short-axis and long-axis views.  It is perhaps  mildly improved over the pre bypass period.  There still remains significant  cardiac dysfunction in this heart.  However, some areas in the anterior and  anterolateral wall appear slightly  more contractile and the septal wall  which was nearly akinetic before also appears more contractile in this post  bypass.   Mitral valve:  The area of the mitral valve is seen.  There may have been a  trace bit more mitral regurgitant flow, but minimally so.   The other structures that were interrogated remained as previously  described, and except for the left ventricular dysfunction noted pre and  somewhat post bypass, the rest of the cardiac examination was as previously  described, and patient returned to the cardiac intensive care unit in stable  condition.           ______________________________  Burna Forts, M.D.     JTM/MEDQ  D:  10/05/2005  T:   10/05/2005  Job:  [id number]

## 2010-05-26 NOTE — Consult Note (Signed)
[name], EBLE NO.:  [id number]   MEDICAL RECORD NO.:  [id number]          PATIENT TYPE:  INP   LOCATION:  3308                         FACILITY:  [facility]   PHYSICIAN:  Francisca December, M.D.  DATE OF BIRTH:  Aug 19, 1950   DATE OF CONSULTATION:  10/03/2005  DATE OF DISCHARGE:                                   CONSULTATION   REASON FOR CONSULTATION:  Chest pain.   HISTORY OF PRESENT ILLNESS:  [name] is a 60 year old woman with known  ASCVD.  A cardiac catheterization in 2001 showed severe diffuse LAD and  distal RCA disease.  She was medically managed, although a CVTS consult was  obtained.  She apparently declined surgery.  She was doing well until  approximately 0300 when she woke with severe dyspnea and substernal chest  pain that was sharp, aching and radiated to both armpits.  No associated  nausea, diaphoresis or vomiting.  Denied any palpitation, dizziness or  syncope.  She took Tums without any relief.  The chest pain continued and  then finally she came to the emergency room arriving here at 0330.  In the  emergency room, her pain was relieved with sublingual and IV nitroglycerin  as well as oxygen.  She drove herself to emergency room.   At the time of my evaluation at 1100 in the morning, she is painfree.   PAST MEDICAL HISTORY:  1. Diabetes mellitus.  2. Postmenopausal state.  3. Atherosclerotic cardiovascular disease as noted above.   SOCIAL HISTORY:  Accompanied in the emergency room this afternoon by her  daughters.  No tobacco.  No ethanol use.   CURRENT MEDICATIONS:  1. Insulin regular 5 units twice daily with meals.  2. Lantus 10 units a.m. and p.m. subcu.  3. Aspirin 325 mg p.o. daily.  4. Iron 325 mg p.o. daily.  5. Recently stopped Avandia/.   DRUG ALLERGIES:  None known.   FAMILY HISTORY:  Both parents are deceased.  There health history is  unknown.   REVIEW OF SYSTEMS:  Negative except as mentioned above.   PHYSICAL  EXAMINATION:  The blood pressure is 168/98, pulse is 100 and  regular, respiratory rate is 20, temperature 97.9.  In general, this is a  mildly obese 60 year old African American woman in no distress.  HEENT is  unremarkable.  The head is atraumatic and normocephalic.  Pupils are equal  and react to light accommodation.  Extraocular movements are intact.  Sclerae are anicteric.  Oral mucosa is pink and moist.  Tongue is not  coated.  The neck is supple without thyromegaly or masses.  The carotid  upstrokes are normal.  There is no bruit.  There is no JVD.  Her chest is  clear with adequate excursion.  Normal breath sounds are heard bilaterally.  The heart has regular rhythm.  Normal S1-S2.  No murmur, click or rub.  No  gallop.  The abdomen is soft, obese, nontender.  No hepatosplenomegaly.  Bowel sounds present throughout.  Genitalia and rectal not performed.  Extremities show full range of motion.  No  edema and distal pulses are  intact.  Neurologically, cranial nerves II-XII are intact.  Motor and  sensory are grossly intact.  Gait not tested.  Skin is warm and dry.   ACCESSORY CLINICAL DATA:  Admission hemogram is normal.  Serum electrolytes  are normal.  Glucose is 397 and she has received insulin.  Her initial CK-  MB, troponin and myoglobin are all normal.  Subsequent CK rose to 213 with a  MB of 19.9 and troponin rose to 1.63 and these were obtained at 0836.   Electrocardiogram initially showed decreased R-wave voltage in V2 but no  acute change of a diagnostic nature.  There were nonspecific ST  abnormalities.  This was at 0402.  At 1036, her electrocardiogram shows a Q-  wave in V2 and perhaps 1-mm ST-segment elevation in that lead.  Other leads  show no Q-waves or ST elevation changes.   Chest x-ray shows cardiomegaly, no acute this disorder.   ASSESSMENT:  1. Septal myocardial infarction now eight hours post initiation of chest      pain.  Currently chest painfree on IV  nitroglycerin and a single dose      of Lovenox.  2. Known atherosclerotic cardiovascular disease with severe diffuse left      anterior descending artery disease.  I have reviewed the angiograms      from 2001 and there is significant stenosis in the mid-portion just at      the first diagonal branch and then diffuse disease which extends from      the mid-portion into the distal portion.  There is a collateral from      the diagonal to the distal left anterior descending artery.  There was      no significant left circumflex disease.  There was a significant      obstruction in the 80% range in the posterolateral branch which was      large.  3. Diabetes mellitus insulin requiring with significantly elevated blood      sugar today.  4. Postmenopausal state.  5. Hypertension.   PLAN:  1. Since the patient is chest painfree and now eight hours after the      initiation of her chest discomfort and given the severe disease in the      LAD which would not be very amenable to percutaneous intervention, I am      electing to proceed with medical therapy at this point which should      include Lovenox and IV nitroglycerin per your plan.  I will add IV      Integrilin.  2. Beta-blocker began with a total of 15 mg IV over approximately one and      a half hours and then 25 mg p.o. twice daily.  3. Will need to begin a ACE inhibitor.  4. Will plan for cardiac catheterization, coronary angiography tomorrow.      Goals, risks and alternatives were discussed.  The patient states her      understanding and wishes to proceed.  5. Diabetic control will be important and will be per your plan.   Thank you very much for allowing me to assist in the care of [facility].  It has been a pleasure to do so.  I will discuss her further care with you.      Francisca December, M.D.  Electronically Signed    JHE/MEDQ  D:  10/03/2005  T:  10/05/2005  Job:  [id number]  cc:   Georgann Housekeeper, MD

## 2010-05-26 NOTE — Discharge Summary (Signed)
NAMEDESIREY, KEAHEY NO.:  [id number]   MEDICAL RECORD NO.:  [id number]          PATIENT TYPE:  INP   LOCATION:  2007                         FACILITY:  [facility]   PHYSICIAN:  Evelene Croon, M.D.     DATE OF BIRTH:  02-May-1950   DATE OF ADMISSION:  10/03/2005  DATE OF DISCHARGE:                                 DISCHARGE SUMMARY   ADMIT DIAGNOSIS:  Chest pain.   PAST MEDICAL HISTORY/DISCHARGE DIAGNOSES:  1. Pulmonary edema.  2. Malignant hypertension.  3. Diabetes mellitus.  4. Coronary artery disease, status post acute anterior myocardial      infarction, status post coronary artery bypass grafting x5.   ALLERGIES:  NO KNOWN DRUG ALLERGIES.   BRIEF HISTORY:  The patient is a 60 year old female with a history of  uncontrolled diabetes and hypertension, as well as coronary artery disease  status post multiple prior cardiac caths, which have shown severe LAD and  right coronary disease.  She had been treated medically.  The patient  presented to the emergency room on October 03, 2005 with complaints of  acute onset of chest pain and dyspnea.  In the emergency room, the pain was  relieved with sublingual nitroglycerin and she was ruled in for an acute  anterior myocardial infarction.  She was stabilized and set up for elective  cardiac cath.   HOSPITAL COURSE:  The patient was admitted via the emergency room and ruled  in for an acute internal myocardial infarction as previously stated.  She  was stabilized and taken for cardiac cath on October 04, 2005, which  revealed severe 3-vessel coronary artery disease.  Secondary to these  findings Dr. Laneta Simmers, of the CVTS service, was consulted and it was his  opinion that the patient should proceed with coronary artery bypass  grafting.  Left ventricular ejection fraction was approximately 30%.   The patient was taken to the OR on October 05, 2005 for coronary artery  bypass grafting x5.  Endoscopic vessel  harvesting was performed on the  bilateral lower extremities.  The internal mammary artery was grafted to the  second diagonal branch to the LAD, saphenous vein was grafted to the distal  right coronary, saphenous vein was grafted to the intermediate, and a  sequential saphenous vein was grafted to the first and second obtuse  marginal branches.  The patient tolerated the procedure well and was  hemodynamically stable immediately postoperatively.  The patient was  transferred from the OR to the [facility] in stable condition.  The patient was  extubated without complications and woke up from anesthesia neurologically  intact.   The patient's postoperative course has progressed as expected.  On  postoperative day 1, she was afebrile with stable vital signs and  fluctuating between normal sinus and sinus tachycardic rhythm.  Her chest x-  ray was stable.  All invasive lines and chest tubes were discontinued on a  routine manner and she tolerated this well.  All drips were weaned  according.  The patient began cardiac rehab in early postoperative course  and has continued to increase  her tolerance to a satisfactory level at this  time.   The patient's diabetes mellitus has been monitored and controlled throughout  the postoperative course by the internal medicine team.  This was poorly  controlled preoperatively, but has been well maintained throughout the  postoperative course.  She will be discharged home on continued insulin  therapy and will be followed by her primary care physician as an outpatient.   The patient has been volume overloaded postoperatively and has been diuresed  accordingly.  Of note, on the postoperative day 2, she was noted to be  somewhat __________ and lethargic with morphine and oxycodone.  These were  subsequently discontinued and she was given Tylenol with Ultram with  adequate pain control.   On postoperative day 5, the patient is afebrile with stable vital signs  and  maintaining a normal sinus rhythm.  Her diabetes mellitus is well controlled  and her bowel function has returned.  She is ambulating well.   On physical exam, cardiac is regular rate and rhythm.  The lungs revealed  decreased breath sounds in the bases.  The abdomen is benign.  There is no  significant edema present in the bilateral lower extremities and the  incisions are clean, dry and intact.  The patient is stable condition at  this time and as long as she continues to progress in the current manner,  she will be ready for discharge home within the next 1 to 2 days pending  morning round reevaluation.   LABS:  BMP on October 09, 2005:  Sodium 139, potassium 3.9, BUN 12,  creatinine 0.7, glucose 110.  CBC on October 08, 2005:  White count 12.5,  hemoglobin 11.2, hematocrit 32.9, platelets 123.   CONDITION ON DISCHARGE:  Improved.   INSTRUCTIONS:  1. Medications:      a.     Aspirin 325 mg daily.      b.     Lopressor 25 mg b.i.d.      c.     Lisinopril 10 mg b.i.d.      d.     Zocor 20 mg q.h.s.      e.     Lasix 80 mg daily x5 days.      f.     K-Dur 40 mEq  daily x5 days.      g.     NovoLog insulin 70/30 20 units b.i.d.      h.     Ultram 50 mg one to two q.4 to 6 hours p.r.n. pain.  2. The patient received specific written instructions regarding activity,      diet and wound care.   FOLLOWUP APPOINTMENT:  1. The patient will be instructed to contact Dr. Amil Amen' office for an      appointment 2 weeks after discharge, at which time a PA and lateral      chest x-ray will be taken.  2. Dr. Laneta Simmers on November 13, 2005 at 1 p.m.  3. The patient will be instructed to contact Dr. Venita Sheffield office an      appointment in 1 to 2 weeks after discharge for diabetes mellitus      followup      Pecola Leisure, [place]      Evelene Croon, M.D.  Electronically Signed    AY/MEDQ  D:  10/10/2005  T:  10/10/2005  Job:  [id number]   cc:   Francisca December, M.D.

## 2010-05-26 NOTE — Op Note (Signed)
[name], [name] NO.:  [id number]   MEDICAL RECORD NO.:  [id number]          PATIENT TYPE:  INP   LOCATION:  2807                         FACILITY:  [facility]   PHYSICIAN:  Francisca December, M.D.  DATE OF BIRTH:  05/15/1950   DATE OF PROCEDURE:  05/01/2006  DATE OF DISCHARGE:                               OPERATIVE REPORT   PROCEDURES PERFORMED:  1. Left subclavian venogram.  2. Insertion of dual-chamber implantable cardiac defibrillator.  3. Left subclavian venogram.   INDICATIONS:  The patient is a 60 year old woman who is now 6 months SP  acute anterior wall myocardial infarction, treated with urgent coronary  bypass surgery.  She had and ejection fraction of approximately 30% at  the time.  Despite adequate medical therapy and revascularization, her  LVEF has not improved.  It is now 28%.  She is brought to the  catheterization laboratory for insertion of an implantable cardiac  defibrillator under the MADIT II criteria.  This is for prevention of  sudden death.   PROCEDURE NOTE:  The patient was brought to the cardiac catheterization  laboratory in fasting state.  The left prepectoral region was prepped  and draped in the usual sterile fashion.  Local anesthesia was obtained  with infiltration of 1% lidocaine with epinephrine throughout the left  prepectoral region.  A 7-8 cm incision was made in the deltopectoral  groove and this was carried down by sharp dissection and electrocautery  to the prepectoral fascia.  There, a plane was lifted and a pocket  formed inferiorly and medially, using blunt dissection and  electrocautery.  The pocket was then packed with 1% kanamycin soaked  gauze.   Two separate left subclavian punctures were performed with a  micropuncture needle set.  Two 0.038-inch tight J guidewires were  placed.  Over the initial guidewire, a 9-French tear-away sheath and  dilator were advanced.  The dilator and wire were removed and the  ventricular lead was advanced to the level of the right atrium.  The  sheath was torn away.  Using standard technique and fluoroscopic  landmarks, the lead was manipulated into the right ventricular apex.  There, excellent pacing parameters were obtained, as noted below.  It  was tested for diaphragmatic pacing at 10 volts and none was found.  The  lead was then sutured into place using three separate 0 silk ligatures.   Over the remaining guidewire, a 7-French tear-away sheath and dilator  were advanced.  The dilator and wire were removed and the atrial lead  was advanced to the level of the right atrium.  The sheath was torn  away.  Again, using standard technique and fluoroscopic landmarks, the  lead was manipulated onto the lateral wall of the right atrium.  This  was an active fixation device and the screw was advanced as appropriate,  as was the right ventricular pace shock lead.  The lead was tested for  diaphragmatic pacing at 10 volts.  Adequate pacing parameters were  obtained, as will be noted below.  The lead was then sutured into place,  using three separate 0 silk ligatures.  The kanamycin soaked gauze was  removed from the pocket.  The pocket was copiously irrigated using 1%  kanamycin solution.   The leads were then attached to the pace shock generator, carefully  identifying each by its serial number and placing each into the  appropriate receptacle.  This was done under the supervision of the  [employer] representative.  Each lead was tightened into place and tested  for security.  The leads were wound beneath the pacing generator and the  generator was placed in the pocket.  An 0 silk anchoring suture was  applied.   We then began adequate moderate sedation for defibrillation threshold  testing.  The patient received a total of 12 mg of midazolam, 100 mcg of  fentanyl, 2 mg of hydromorphone.  After establishing adequate moderate  sedation, the patient had ventricular  fibrillation induced by the shock  on T technique.  The arrhythmia was promptly detected.  The device  charged over 7 seconds and delivered a 20-joule shock at 38 ohm  impedance.  There was prompt return of sinus rhythm without dropouts.   The wound was then closed using 2-0 Vicryl in a running fashion.  Two  layers were applied.  The skin was approximated using 4-0 Vicryl in a  running subcuticular fashion.  Steri-Strips and sterile dressing were  applied.  The patient was  transported to the recovery area in stable  condition.   EQUIPMENT DATA:  The pace shock generator is a [employer] Virtuoso, model  number D [id number], serial number [id number].  The ventricular lead is a  Child psychotherapist, model number [id number], serial number  [id number].  The atrial lead is a [employer] model number [id number], serial  number [id number].   PACING DATA:  The atrial lead detected a 2.3 millivolt P-wave.  The  pacing threshold was 0.5 volts at 0.5 millisecond pulse width.  The  impedance was 515 ohms, resulting in a current at capture threshold of  2.4 MA.  The ventricular lead detected at 14.9 millivolt R wave.  The  pacing threshold was 0.7 volts at 0.5 milliseconds pulse width.  The  impedance was 825 ohms, resulting in a current at capture threshold of  1.1 MA.      Francisca December, M.D.  Electronically Signed     JHE/MEDQ  D:  05/01/2006  T:  05/01/2006  Job:  [id number]

## 2010-05-26 NOTE — Cardiovascular Report (Signed)
NAMEREYNALDA, CANNY NO.:  [id number]   MEDICAL RECORD NO.:  [id number]          PATIENT TYPE:  INP   LOCATION:  2313                         FACILITY:  [facility]   PHYSICIAN:  Francisca December, M.D.  DATE OF BIRTH:  05-Feb-1950   DATE OF PROCEDURE:  10/04/2005  DATE OF DISCHARGE:                              CARDIAC CATHETERIZATION   PROCEDURES PERFORMED:  1. Left heart catheterization.  2. Coronary angiography.  3. Left ventriculogram.   INDICATIONS:  [name] is a 60 year old woman with known ASCVD, two-  vessel dating to 2001.  She had severe diffuse disease in the LAD not felt  to be treatable by catheter-based methods.  She has been on medical therapy.  She presented yesterday with prolonged chest pain and ruled in for an  anterior wall myocardial infarction.  Peak CK of 281, MB of 16, troponin of  5.  When she initially presented,  she was mild to moderately short of  breath as well and a chest x-ray showed pulmonary edema, predominantly  resolved after 80 mg of furosemide administered in the emergency room.   She was brought to catheterization laboratory at this time to identify the  extent of disease and provide for further therapeutic options.   PROCEDURE NOTE:  The patient was brought to cardiac catheterization  laboratory in a fasting state.  The right groin was prepped and draped in  the usual sterile fashion.  Local anesthesia was obtained with infiltration  of 1% lidocaine.  A 6-French catheter sheath was inserted percutaneously  into the right femoral artery, utilizing an anterior approach over a guiding  J-wire.  Left heart catheterization and coronary angiography then proceeded  in the standard fashion using a 6-French #4 left and right Judkins catheter  and a 110-cm pigtail catheter.  A 30 degree RAO cine left ventriculogram was  performed utilizing a power injector, 39 mL were injected at 13 mL per  second.  At the completion of the  procedure, a right femoral arteriogram in  the 45 degree RAO angulation via the catheter sheath by hand injection  verified adequate anatomy for placement of the percutaneous closure device,  AngioSeal.  This was subsequently deployed with good hemostasis and intact  distal pulse.   HEMODYNAMIC RESULTS:  1. Systemic arterial pressure was 134/86 with a mean of 106-mmHg.  2. There was no systolic gradient across the aortic valve.  3. The left ventricular end-diastolic pressure was 38-mmHg pre-      ventriculogram and unchanged post ventriculogram.   ANGIOGRAPHY:  The left ventriculogram demonstrated mild chamber dilatation  and global hypokinesis and there was clear akinesis of the anterolateral  wall.  There was hypokinesis of the entire inferior wall and the apical  anterior wall.  The apex itself was focally akinetic.  The basal anterior  wall moved adequately.  There is 2+ mitral regurgitation.  There is a  trileaflet aortic valve that opens normally during systole.   1. There is a right dominant coronary system present.  2. The main left coronary has a 10% distal narrowing.  3.  The left anterior descending artery and its branches are highly      diseased; the vessel has a normal caliber until it reaches the first      diagonal branch which is relatively small.  This diagonal branch is a      95% stenosis at its origin.  The second diagonal branch is very large      and has a 70-80% stenosis at its origin.  It subdivides into lateral      and medial sub-branches.  There is a 50% stenosis in the proximal      portion of this large diagonal.  The distal portion of the large      diagonal medial sub-branch gives a large collateral to the distal LAD.      The ongoing anterior descending artery, after the origin of the third      septal perforator and the second diagonal branch, is subtotally      occluded and diffusely diseased throughout its entire length, down to      and including  the apical segments.  This vessel would not be a      candidate for bypass.  4. The left circumflex coronary artery and its branches are highly      diseased; the vessel has a large ramus intermedius that is a subtotal      stenosis at its proximal segment.  The ongoing circumflex has a 50%      stenosis prior the origin of a moderate-sized first marginal branch and      then a 70% stenosis prior to the origin of a moderate-sized obtuse      marginal branch.  The ongoing circumflex and the AV groove does not      give any other additional substantial branches.  5. The right coronary artery and its branches are highly diseased; there      is a proximal 30% narrowing and then diffuse disease into the mid      portion, where just before a right ventricular branch there is a 70%      stenosis at approximately 15-mm after the right ventricular branch.      There is a subtotal stenosis in the right coronary with trivial      antegrade flow.  The acute marginal branch has a 70-80% stenosis in its      origin and it does provide collateral flow to the distal PDA.  The PDA      is not seen on right coronary injections.  It is seen on collateral      flow from the left during left coronary injections.  It is under      filled.  6. Collateral vessels are seen as mentioned from the distal diagonal      branch to the distal LAD and from the septal perforator arcade the      distal left circumflex into the distal right coronary segment.   FINAL IMPRESSION:  1. Atherosclerotic coronary vascular disease, severe three-vessel.  2. Moderate to severe reduction in left ventricular systolic function.  3. Severe elevation of left ventricular end-diastolic pressure concordant      with her presentation of pulmonary edema.   PLAN/RECOMMENDATIONS:  I will administer IV furosemide 80 mg at this time,  discontinue Lovenox and Integrilin, and obtain a cardiovascular and thoracic surgery consult.  Also, will need  continued medical therapy in the form of  beta blocker and ACE inhibitor.  Fortunately, the patient does  not display  any evidence of significant renal insufficiency.      Francisca December, M.D.  Electronically Signed     JHE/MEDQ  D:  10/04/2005  T:  10/06/2005  Job:  [id number]   cc:   [facility]  Georgann Housekeeper, MD

## 2010-05-26 NOTE — H&P (Signed)
[name], [name]                ACCOUNT NO.:  [id number]   MEDICAL RECORD NO.:  [id number]          PATIENT TYPE:  INP   LOCATION:  3308                         FACILITY:  [facility]   PHYSICIAN:  [name], M.D.DATE OF BIRTH:  November 07, 1950   DATE OF ADMISSION:  10/03/2005  DATE OF DISCHARGE:                                HISTORY & PHYSICAL   PRIMARY CARE PHYSICIAN:  Dr. Georgann Tran.   CHIEF COMPLAINT:  Shortness of breath.   HISTORY OF PRESENT ILLNESS:  The patient is a 60 year old black female with  past medical history significant for hypertension, diabetes mellitus and two-  vessel coronary artery disease, who presents with complaints of acute onset  of shortness of breath x2-3 hours.  She denies cough, fevers, leg swelling,  PND, orthopnea and also denies chest pain.  She states that she has been out  of her blood pressure medications for 2-3 months.  The patient also denies  nausea and vomiting, dysuria, diarrhea, melena and no hematochezia.   The patient was seen in the ER and her initial blood pressure was found to  be markedly elevated at 225/143 and her initial chest x-ray showed  cardiomegaly with asymmetric edema, probable CHF.  In the ER, she was  started on IV nitroglycerin as well as IV Lasix with symptomatic improvement  and a followup chest x-ray showed improvement of airspace disease consistent  with edema.  The patient is admitted for further evaluation and management.   PAST MEDICAL HISTORY:  As stated above.   MEDICATIONS:  1. Lantus 10 units b.i.d.  2. Regular insulin as directed.  3. She states that she has been out of her blood pressure pills for about      2-3 months.   ALLERGIES:  No known drug allergies.   SOCIAL HISTORY:  She denies alcohol.  She also denies tobacco.   FAMILY HISTORY:  Her mom is deceased; she had diabetes mellitus.   REVIEW OF SYSTEMS:  As per HPI, other review of systems negative.   PHYSICAL EXAM:  GENERAL:  The  patient is an obese, older black female.  She  is alert and oriented, in no apparent distress with nasal cannula oxygen.  VITAL SIGNS:  Her blood pressure is 191/95, initially 225/143.  Her pulse is  110, initially 117, and her O2 SAT is 94%, initially 88%.  Her respiratory  rate is 28.  HEENT:  PERRL, EOMI, sclerae anicteric.  Moist mucous membranes, no oral  exudates.  NECK:  Supple.  No adenopathy and no JVD, no thyromegaly.  LUNGS:  She has crackles in the lower lobes bilaterally.  Respirations  nonlabored.  CARDIOVASCULAR:  Tachycardic, regular rhythm, , normal S1 and S2, no S3  appreciated.  ABDOMEN:  Soft, bowel sounds present, nontender and non-distended.  No  organomegaly and no masses palpable.  EXTREMITIES:  No cyanosis and no edema.  NEUROLOGIC:  She is alert and oriented x3.  Cranial nerves II-XII are  grossly intact.  Nonfocal exam.   LABORATORY DATA:  Her pH is 7.40 with a PCO2 of 38.6, PO2 of  82 and O2 SAT  of 96%.  The white cell count is 6.7 with a hemoglobin of 16.5, hematocrit  of 49.4%, platelet count of 212,000, neutrophil count of 50%.  Point-of-care  markers negative x1.  Her sodium is 136, potassium 3.6, chloride of 101, CO2  23, glucose of 397 with a BUN of 8, creatinine of 0.8, calcium 9.6.  Brain  natriuretic peptide  185.   RADIOLOGIC FINDINGS:  Initial chest x-ray:  Probable CHF with asymmetric  edema.  Followup x-ray:  Significant improvement in bilateral airspace  disease compatible with improving edema.  Minimal residual interstitial  edema.   ASSESSMENT AND PLAN:  1. Pulmonary edema, likely flash pulmonary edema, secondary to malignant      hypertension, but as discussed above, patient with history of two-      vessel coronary artery disease and diabetes.  We will obtain serial      cardiac enzymes to rule out myocardial infarction, obtain a 2-D      echocardiogram, also obtain a TSH.  We will continue nitroglycerin drip      and intravenous  Lasix, strict intakes and outputs, daily weights,      follow.  2. Malignant hypertension, as above.  We will also add beta blockers and      follow.  3. History of coronary artery disease, two-vessel disease per April 2001      cardiac catheterization.  We will add beta blockers, aspirin, follow      and consult Cardiology pending above studies.  4. Diabetes mellitus, uncontrolled.  Accu-Chek monitoring, sliding-scale      coverage, continue Lantus.      [name], M.D.  Electronically Signed     ACV/MEDQ  D:  10/04/2005  T:  10/04/2005  Job:  [id number]   cc:   Jamie Housekeeper, MD  Fax: [phone]

## 2010-06-14 ENCOUNTER — Encounter: Payer: Self-pay | Admitting: Internal Medicine

## 2010-06-15 ENCOUNTER — Ambulatory Visit (INDEPENDENT_AMBULATORY_CARE_PROVIDER_SITE_OTHER): Payer: Medicaid Other | Admitting: Internal Medicine

## 2010-06-15 ENCOUNTER — Encounter: Payer: Self-pay | Admitting: Internal Medicine

## 2010-06-15 DIAGNOSIS — I5022 Chronic systolic (congestive) heart failure: Secondary | ICD-10-CM

## 2010-06-15 DIAGNOSIS — I255 Ischemic cardiomyopathy: Secondary | ICD-10-CM

## 2010-06-15 DIAGNOSIS — I10 Essential (primary) hypertension: Secondary | ICD-10-CM

## 2010-06-15 DIAGNOSIS — I2510 Atherosclerotic heart disease of native coronary artery without angina pectoris: Secondary | ICD-10-CM

## 2010-06-15 DIAGNOSIS — I428 Other cardiomyopathies: Secondary | ICD-10-CM

## 2010-06-15 DIAGNOSIS — I509 Heart failure, unspecified: Secondary | ICD-10-CM

## 2010-06-15 DIAGNOSIS — I2589 Other forms of chronic ischemic heart disease: Secondary | ICD-10-CM

## 2010-06-15 NOTE — Patient Instructions (Signed)
Your physician recommends that you schedule a follow-up appointment in: 3 months with device clinic and 12 months with Dr Johney Frame  Your physician recommends that you continue on your current medications as directed. Please refer to the Current Medication list given to you today.

## 2010-06-18 ENCOUNTER — Encounter: Payer: Self-pay | Admitting: Internal Medicine

## 2010-06-18 DIAGNOSIS — I10 Essential (primary) hypertension: Secondary | ICD-10-CM | POA: Insufficient documentation

## 2010-06-18 DIAGNOSIS — I255 Ischemic cardiomyopathy: Secondary | ICD-10-CM | POA: Insufficient documentation

## 2010-06-18 DIAGNOSIS — I2510 Atherosclerotic heart disease of native coronary artery without angina pectoris: Secondary | ICD-10-CM | POA: Insufficient documentation

## 2010-06-18 HISTORY — DX: Atherosclerotic heart disease of native coronary artery without angina pectoris: I25.10

## 2010-06-18 NOTE — Progress Notes (Signed)
[name] is a pleasant 60 y.o. F patient with a h/o CAD s/p CABG 2007, Ischemic CM (EF 30-35%) and chronic NYHA Class III CHF sp ICD implantation (MDT) by Dr Amil Amen for primary prevention of sudde death who presents today to establish care in the Electrophysiology device clinic.   The patient reports doing reasonably well since having a defibrillator implanted and remains reasonably active despite her age.  She reports that she is primarily limited by DJD.  She uses O2 at night due to OSA but does not use CPAP.  She reports dypsnea with moderate activity.  Today, she  denies symptoms of palpitations, chest pain, orthopnea, PND, lower extremity edema, dizziness, presyncope, syncope, or neurologic sequela.  The patientis tolerating medications without difficulties and is otherwise without complaint today.   Past Medical History  Diagnosis Date  . Coronary artery disease     s/p CABG 2007  . Sleep apnea     uses O2 at night  . Ischemic cardiomyopathy     EF 30-35%, s/p ICD 4/08 by Dr Amil Amen  . Diabetes mellitus     type II  . Hypertension   . Obesity     Past Surgical History  Procedure Date  . Cardiac catheterization   . Cardiac defibrillator placement 4/08    by Dr Amil Amen (MDT)  . Coronary artery bypass graft 2007    History   Social History  . Marital Status: Married    Spouse Name: N/A    Number of Children: 3  . Years of Education: N/A   Occupational History  . HAIRDRESSER    Social History Main Topics  . Smoking status: Never Smoker   . Smokeless tobacco: Not on file  . Alcohol Use: No  . Drug Use: No  . Sexually Active: Not on file   Other Topics Concern  . Not on file   Social History Narrative   Disabled Producer, television/film/video.  Currently taking sociology classes at [employer].    Family History  Problem Relation Age of Onset  . Diabetes Mother 60    died - HTN  . Other      No early family hx of CAD    Allergies no known allergies  Current Outpatient  Prescriptions  Medication Sig Dispense Refill  . aspirin 325 MG tablet Take 325 mg by mouth daily.        Marland Kitchen atorvastatin (LIPITOR) 20 MG tablet Take 20 mg by mouth daily.        . carvedilol (COREG) 12.5 MG tablet Take 12.5 mg by mouth 2 (two) times daily with a meal.        . Cyanocobalamin (VITAMIN B-12 IJ) Inject as directed every 30 (thirty) days.        Marland Kitchen ezetimibe (ZETIA) 10 MG tablet Take 10 mg by mouth daily.        . furosemide (LASIX) 80 MG tablet Take 80 mg by mouth 2 (two) times daily.        . insulin glargine (LANTUS) 100 UNIT/ML injection Inject into the skin at bedtime.        . insulin lispro (HUMALOG) 100 UNIT/ML injection Inject into the skin as directed.        . isosorbide mononitrate (IMDUR) 60 MG 24 hr tablet Take 60 mg by mouth daily.        Marland Kitchen lisinopril (PRINIVIL,ZESTRIL) 5 MG tablet Take 5 mg by mouth daily.        . potassium chloride  SA (K-DUR,KLOR-CON) 20 MEQ tablet Take 20 mEq by mouth daily.        Marland Kitchen spironolactone (ALDACTONE) 25 MG tablet Take 25 mg by mouth daily.          ROS- all systems are reviewed and negative except as per HPI  Physical Exam: Filed Vitals:   06/15/10 1227  BP: 142/64  Pulse: 64  Height: 5\' 6"  (1.676 m)  Weight: 258 lb (117.028 kg)    GEN- The patient is overweight appearing, alert and oriented x 3 today.   Head- normocephalic, atraumatic Eyes-  Sclera clear, conjunctiva pink Ears- hearing intact Oropharynx- clear Neck- supple, no JVP Lymph- no cervical lymphadenopathy Lungs- Clear to ausculation bilaterally, normal work of breathing Chest- ICD pocket is well healed Heart- Regular rate and rhythm, no murmurs, rubs or gallops,   GI- soft, NT, ND, + BS Extremities- no clubbing, cyanosis, 1+ edema MS- no significant deformity or atrophy Skin- no rash or lesion Psych- euthymic mood, full affect Neuro- strength and sensation are intact  ICD interrogation- reviewed in detail today,  See [facility] report  Assessment and  Plan:

## 2010-06-18 NOTE — Assessment & Plan Note (Signed)
Salt restriction  

## 2010-06-18 NOTE — Assessment & Plan Note (Signed)
As above.

## 2010-06-18 NOTE — Assessment & Plan Note (Signed)
No symptoms of ischemic No changes to medicine today

## 2010-06-18 NOTE — Assessment & Plan Note (Signed)
[name] has an ICD previously implanted for primary prevention of sudden death.  She has stable NYHA Class III CHF.  She appears euvolemic today. No medicine changes. Normal ICD function See Arita Miss Art report NID increased today to prevent inappropriate ICD shocks.

## 2010-09-18 ENCOUNTER — Encounter: Payer: Medicaid Other | Admitting: *Deleted

## 2010-09-21 ENCOUNTER — Ambulatory Visit (INDEPENDENT_AMBULATORY_CARE_PROVIDER_SITE_OTHER): Payer: Medicaid Other | Admitting: *Deleted

## 2010-09-21 ENCOUNTER — Encounter: Payer: Self-pay | Admitting: Internal Medicine

## 2010-09-21 DIAGNOSIS — I509 Heart failure, unspecified: Secondary | ICD-10-CM

## 2010-09-21 DIAGNOSIS — I255 Ischemic cardiomyopathy: Secondary | ICD-10-CM

## 2010-09-21 DIAGNOSIS — I5022 Chronic systolic (congestive) heart failure: Secondary | ICD-10-CM

## 2010-09-21 DIAGNOSIS — I2589 Other forms of chronic ischemic heart disease: Secondary | ICD-10-CM

## 2010-09-21 LAB — ICD DEVICE OBSERVATION
AL AMPLITUDE: 1.5 mv
AL IMPEDENCE ICD: 608 Ohm
ATRIAL PACING ICD: 0 pct
BATTERY VOLTAGE: 3.04 V
RV LEAD AMPLITUDE: 11.7 mv
RV LEAD IMPEDENCE ICD: 440 Ohm
RV LEAD THRESHOLD: 1 V
TOT-0006: 20080423000000
TZAT-0001ATACH: 1
TZAT-0001ATACH: 2
TZAT-0001SLOWVT: 2
TZAT-0002ATACH: NEGATIVE
TZAT-0002ATACH: NEGATIVE
TZAT-0012ATACH: 150 ms
TZAT-0012ATACH: 150 ms
TZAT-0012FASTVT: 200 ms
TZAT-0018ATACH: NEGATIVE
TZAT-0018ATACH: NEGATIVE
TZAT-0019ATACH: 6 V
TZAT-0019ATACH: 6 V
TZAT-0019SLOWVT: 8 V
TZAT-0019SLOWVT: 8 V
TZAT-0020FASTVT: 1.5 ms
TZAT-0020SLOWVT: 1.5 ms
TZAT-0020SLOWVT: 1.5 ms
TZON-0003ATACH: 350 ms
TZON-0004VSLOWVT: 20
TZST-0001ATACH: 4
TZST-0001ATACH: 5
TZST-0001FASTVT: 4
TZST-0001FASTVT: 5
TZST-0001SLOWVT: 5
TZST-0001SLOWVT: 6
TZST-0002ATACH: NEGATIVE
TZST-0002FASTVT: NEGATIVE
TZST-0002FASTVT: NEGATIVE
TZST-0003SLOWVT: 35 J
VF: 0

## 2010-09-21 NOTE — Progress Notes (Signed)
icd check in clinic  

## 2010-10-03 ENCOUNTER — Encounter: Payer: Self-pay | Admitting: *Deleted

## 2010-12-21 ENCOUNTER — Ambulatory Visit (INDEPENDENT_AMBULATORY_CARE_PROVIDER_SITE_OTHER): Payer: Medicaid Other | Admitting: *Deleted

## 2010-12-21 ENCOUNTER — Encounter: Payer: Self-pay | Admitting: Internal Medicine

## 2010-12-21 DIAGNOSIS — I5022 Chronic systolic (congestive) heart failure: Secondary | ICD-10-CM

## 2010-12-21 DIAGNOSIS — I255 Ischemic cardiomyopathy: Secondary | ICD-10-CM

## 2010-12-21 DIAGNOSIS — I2589 Other forms of chronic ischemic heart disease: Secondary | ICD-10-CM

## 2010-12-21 DIAGNOSIS — I509 Heart failure, unspecified: Secondary | ICD-10-CM

## 2010-12-21 LAB — ICD DEVICE OBSERVATION
AL THRESHOLD: 0.5 V
ATRIAL PACING ICD: 0.01 pct
CHARGE TIME: 9.289 s
DEV-0020ICD: NEGATIVE
PACEART VT: 0
TOT-0001: 1
TOT-0002: 0
TZAT-0001ATACH: 1
TZAT-0001SLOWVT: 1
TZAT-0002ATACH: NEGATIVE
TZAT-0002ATACH: NEGATIVE
TZAT-0002ATACH: NEGATIVE
TZAT-0002FASTVT: NEGATIVE
TZAT-0005SLOWVT: 88 pct
TZAT-0005SLOWVT: 91 pct
TZAT-0011SLOWVT: 10 ms
TZAT-0011SLOWVT: 10 ms
TZAT-0012ATACH: 150 ms
TZAT-0012ATACH: 150 ms
TZAT-0012SLOWVT: 200 ms
TZAT-0013SLOWVT: 1
TZAT-0013SLOWVT: 1
TZAT-0018ATACH: NEGATIVE
TZAT-0018ATACH: NEGATIVE
TZAT-0018SLOWVT: NEGATIVE
TZAT-0018SLOWVT: NEGATIVE
TZAT-0019ATACH: 6 V
TZAT-0019ATACH: 6 V
TZAT-0019FASTVT: 8 V
TZAT-0020ATACH: 1.5 ms
TZON-0004SLOWVT: 20
TZON-0005SLOWVT: 12
TZST-0001ATACH: 4
TZST-0001ATACH: 5
TZST-0001FASTVT: 3
TZST-0001SLOWVT: 4
TZST-0001SLOWVT: 6
TZST-0002ATACH: NEGATIVE
TZST-0002ATACH: NEGATIVE
TZST-0002FASTVT: NEGATIVE
TZST-0002FASTVT: NEGATIVE
TZST-0002FASTVT: NEGATIVE
TZST-0003SLOWVT: 35 J
VENTRICULAR PACING ICD: 0 pct

## 2010-12-21 NOTE — Progress Notes (Signed)
ICD check with ICM 

## 2011-02-19 ENCOUNTER — Other Ambulatory Visit: Payer: Self-pay | Admitting: Internal Medicine

## 2011-02-19 DIAGNOSIS — E042 Nontoxic multinodular goiter: Secondary | ICD-10-CM

## 2011-02-23 ENCOUNTER — Other Ambulatory Visit: Payer: Self-pay

## 2011-03-15 ENCOUNTER — Ambulatory Visit
Admission: RE | Admit: 2011-03-15 | Discharge: 2011-03-15 | Disposition: A | Discharge status: Home / Self Care | Payer: Medicaid Other | Source: Ambulatory Visit | Attending: Internal Medicine | Admitting: Internal Medicine

## 2011-03-15 DIAGNOSIS — E042 Nontoxic multinodular goiter: Secondary | ICD-10-CM

## 2011-03-23 ENCOUNTER — Emergency Department (HOSPITAL_COMMUNITY): Payer: Medicaid Other

## 2011-03-23 ENCOUNTER — Inpatient Hospital Stay (HOSPITAL_COMMUNITY)
Admission: EM | Admit: 2011-03-23 | Discharge: 2011-03-26 | DRG: 293 | Disposition: A | Discharge status: Home / Self Care | Payer: Medicaid Other | Attending: Cardiology | Admitting: Cardiology

## 2011-03-23 ENCOUNTER — Other Ambulatory Visit: Payer: Self-pay

## 2011-03-23 ENCOUNTER — Encounter (HOSPITAL_COMMUNITY): Payer: Self-pay | Admitting: Emergency Medicine

## 2011-03-23 DIAGNOSIS — I5022 Chronic systolic (congestive) heart failure: Secondary | ICD-10-CM

## 2011-03-23 DIAGNOSIS — R0602 Shortness of breath: Secondary | ICD-10-CM | POA: Diagnosis present

## 2011-03-23 DIAGNOSIS — I209 Angina pectoris, unspecified: Secondary | ICD-10-CM | POA: Diagnosis present

## 2011-03-23 DIAGNOSIS — Z95810 Presence of automatic (implantable) cardiac defibrillator: Secondary | ICD-10-CM

## 2011-03-23 DIAGNOSIS — I509 Heart failure, unspecified: Secondary | ICD-10-CM | POA: Diagnosis present

## 2011-03-23 DIAGNOSIS — IMO0001 Reserved for inherently not codable concepts without codable children: Secondary | ICD-10-CM | POA: Diagnosis present

## 2011-03-23 DIAGNOSIS — I2510 Atherosclerotic heart disease of native coronary artery without angina pectoris: Secondary | ICD-10-CM | POA: Diagnosis present

## 2011-03-23 DIAGNOSIS — Z794 Long term (current) use of insulin: Secondary | ICD-10-CM

## 2011-03-23 DIAGNOSIS — I2589 Other forms of chronic ischemic heart disease: Secondary | ICD-10-CM | POA: Diagnosis present

## 2011-03-23 DIAGNOSIS — R079 Chest pain, unspecified: Secondary | ICD-10-CM | POA: Diagnosis present

## 2011-03-23 DIAGNOSIS — I200 Unstable angina: Secondary | ICD-10-CM

## 2011-03-23 DIAGNOSIS — G4730 Sleep apnea, unspecified: Secondary | ICD-10-CM | POA: Diagnosis present

## 2011-03-23 DIAGNOSIS — I10 Essential (primary) hypertension: Secondary | ICD-10-CM | POA: Diagnosis present

## 2011-03-23 DIAGNOSIS — E669 Obesity, unspecified: Secondary | ICD-10-CM | POA: Diagnosis present

## 2011-03-23 DIAGNOSIS — Z951 Presence of aortocoronary bypass graft: Secondary | ICD-10-CM

## 2011-03-23 DIAGNOSIS — I5023 Acute on chronic systolic (congestive) heart failure: Principal | ICD-10-CM | POA: Diagnosis present

## 2011-03-23 LAB — BASIC METABOLIC PANEL
Calcium: 9.6 mg/dL (ref 8.4–10.5)
Creatinine, Ser: 0.86 mg/dL (ref 0.50–1.10)
GFR calc non Af Amer: 71 mL/min — ABNORMAL LOW (ref >90)
Sodium: 139 mEq/L (ref 135–145)

## 2011-03-23 LAB — CBC
MCH: 29.6 pg (ref 26.0–34.0)
MCHC: 32.6 g/dL (ref 30.0–36.0)
MCV: 90.8 fL (ref 78.0–100.0)
Platelets: 195 10*3/uL (ref 150–400)

## 2011-03-23 LAB — TROPONIN I: Troponin I: <0.3 ng/mL (ref <0.30)

## 2011-03-23 LAB — PRO B NATRIURETIC PEPTIDE: Pro B Natriuretic peptide (BNP): 803.9 pg/mL — ABNORMAL HIGH (ref 0–125)

## 2011-03-23 MED ORDER — FUROSEMIDE 10 MG/ML IJ SOLN
40.0000 mg | Freq: Once | INTRAMUSCULAR | Status: AC
  Administered 2011-03-23: 40 mg via INTRAVENOUS
  Filled 2011-03-23: qty 4

## 2011-03-23 NOTE — ED Notes (Signed)
Patient c/o intermittent mid-sternal, non-radiating, chest pain/pressure x 1 week accompanied with minimal nausea; DOE/ShOB. Patient reports pain worsens with activity.

## 2011-03-23 NOTE — ED Provider Notes (Signed)
History     CSN: [id number]  Arrival date & time 03/23/11  2044   First MD Initiated Contact with Patient 03/23/11 2132      Chief Complaint  Patient presents with  . Shortness of Breath     HPI  History provided by the patient. Patient is a 61 year old female with history of hypertension, diabetes, CAD status post CABG in 2008 and ICD placement who presents with complaints of intermittent chest pain and pressure for the past one week. Pain is associated with symptoms of shortness of breath and is made worse with exertion. Patient reports taking 3 nitroglycerin intermittently over the course of the week for symptoms with quick improvement. Symptoms have also been improved with rest.  Patient currently denies chest pain while resting at this time.  Patient denies having any significant increased swelling in extremities. Patient denies having any associated fever, chills, sweats, nausea, vomiting, cough. Symptoms are described as moderate to severe. She denies any other aggravating or alleviating factors. Patient had last catheterization one year ago.    Past Medical History  Diagnosis Date  . Coronary artery disease     s/p CABG 2007  . Sleep apnea     uses O2 at night  . Ischemic cardiomyopathy     EF 30-35%, s/p ICD 4/08 by Dr Amil Amen  . Diabetes mellitus     type II  . Hypertension   . Obesity     Past Surgical History  Procedure Date  . Cardiac catheterization   . Cardiac defibrillator placement 4/08    by Dr Amil Amen (MDT)  . Coronary artery bypass graft 2007    Family History  Problem Relation Age of Onset  . Diabetes Mother 33    died - HTN  . Other      No early family hx of CAD    History  Substance Use Topics  . Smoking status: Never Smoker   . Smokeless tobacco: Not on file  . Alcohol Use: No    OB History    Grav Para Term Preterm Abortions TAB SAB Ect Mult Living                  Review of Systems  Constitutional: Negative for fever and  chills.  Respiratory: Positive for shortness of breath. Negative for cough.   Cardiovascular: Positive for chest pain. Negative for palpitations.  Gastrointestinal: Negative for abdominal pain.  All other systems reviewed and are negative.    Allergies  Review of patient's allergies indicates no known allergies.  Home Medications   Current Outpatient Rx  Name Route Sig Dispense Refill  . ASPIRIN 325 MG PO TABS Oral Take 325 mg by mouth daily.      . ATORVASTATIN CALCIUM 20 MG PO TABS Oral Take 80 mg by mouth daily.     Marland Kitchen CARVEDILOL 12.5 MG PO TABS Oral Take 25 mg by mouth 2 (two) times daily with a meal.     . EZETIMIBE 10 MG PO TABS Oral Take 10 mg by mouth daily.      . FUROSEMIDE 80 MG PO TABS Oral Take 80 mg by mouth 2 (two) times daily.      Marland Kitchen HYDROCORTISONE 1 % EX CREA Topical Apply 1 application topically 2 (two) times daily as needed. For rash    . INSULIN GLARGINE 100 UNIT/ML [place] SOLN Subcutaneous Inject 100 Units into the skin at bedtime.     . INSULIN LISPRO (HUMAN) 100 UNIT/ML [place] SOLN Subcutaneous  Inject 40 Units into the skin 3 (three) times daily after meals.     . ISOSORBIDE PO Oral Take 1 tablet by mouth daily.    Marland Kitchen LISINOPRIL 5 MG PO TABS Oral Take 20 mg by mouth daily.     Marland Kitchen POTASSIUM CHLORIDE CRYS ER 20 MEQ PO TBCR Oral Take 20 mEq by mouth daily.      Marland Kitchen SPIRONOLACTONE 25 MG PO TABS Oral Take 25 mg by mouth daily.        BP 165/90  Pulse 85  Resp 21  SpO2 95%  Physical Exam  Nursing note and vitals reviewed. Constitutional: She is oriented to person, place, and time. She appears well-developed and well-nourished. No distress.  HENT:  Head: Normocephalic.  Neck: Normal range of motion. Neck supple.  Cardiovascular: Normal rate and regular rhythm.   Pulmonary/Chest: Effort normal and breath sounds normal. No respiratory distress. She has no wheezes. She has no rales.       Sternal scar consistent with surgery. Left ICD in place under skin.  Abdominal: Soft.  She exhibits no distension. There is no tenderness. There is no rebound and no guarding.       Obese  Musculoskeletal:       Very mild lower extremity edema.  Neurological: She is alert and oriented to person, place, and time.  Skin: Skin is warm and dry. No rash noted.  Psychiatric: She has a normal mood and affect. Her behavior is normal.    ED Course  Procedures  Results for orders placed during the hospital encounter of 03/23/11  CBC      Component Value Range   WBC 6.8  4.0 - 10.5 (K/uL)   RBC 4.77  3.87 - 5.11 (MIL/uL)   Hemoglobin 14.1  12.0 - 15.0 (g/dL)   HCT 16.1  09.6 - 04.5 (%)   MCV 90.8  78.0 - 100.0 (fL)   MCH 29.6  26.0 - 34.0 (pg)   MCHC 32.6  30.0 - 36.0 (g/dL)   RDW 40.9  81.1 - 91.4 (%)   Platelets 195  150 - 400 (K/uL)  BASIC METABOLIC PANEL      Component Value Range   Sodium 139  135 - 145 (mEq/L)   Potassium 4.9  3.5 - 5.1 (mEq/L)   Chloride 101  96 - 112 (mEq/L)   CO2 30  19 - 32 (mEq/L)   Glucose, Bld 302 (*) 70 - 99 (mg/dL)   BUN 10  6 - 23 (mg/dL)   Creatinine, Ser 7.82  0.50 - 1.10 (mg/dL)   Calcium 9.6  8.4 - 95.6 (mg/dL)   GFR calc non Af Amer 71 (*) >90 (mL/min)   GFR calc Af Amer 83 (*) >90 (mL/min)  PRO B NATRIURETIC PEPTIDE      Component Value Range   Pro B Natriuretic peptide (BNP) 803.9 (*) 0 - 125 (pg/mL)  TROPONIN I      Component Value Range   Troponin I <0.30  <0.30 (ng/mL)  PROTIME-INR      Component Value Range   Prothrombin Time 13.8  11.6 - 15.2 (seconds)   INR 1.04  0.00 - 1.49   CARDIAC PANEL(CRET KIN+CKTOT+MB+TROPI)      Component Value Range   Total CK 85  7 - 177 (U/L)   CK, MB 2.8  0.3 - 4.0 (ng/mL)   Troponin I <0.30  <0.30 (ng/mL)   Relative Index RELATIVE INDEX IS INVALID  0.0 - 2.5   CARDIAC PANEL(CRET  KIN+CKTOT+MB+TROPI)      Component Value Range   Total CK 87  7 - 177 (U/L)   CK, MB 3.2  0.3 - 4.0 (ng/mL)   Troponin I <0.30  <0.30 (ng/mL)   Relative Index RELATIVE INDEX IS INVALID  0.0 - 2.5   CARDIAC  PANEL(CRET KIN+CKTOT+MB+TROPI)      Component Value Range   Total CK 72  7 - 177 (U/L)   CK, MB 2.5  0.3 - 4.0 (ng/mL)   Troponin I <0.30  <0.30 (ng/mL)   Relative Index RELATIVE INDEX IS INVALID  0.0 - 2.5   TSH      Component Value Range   TSH 1.944  0.350 - 4.500 (uIU/mL)  HEMOGLOBIN A1C      Component Value Range   Hemoglobin A1C 9.5 (*) <5.7 (%)   Mean Plasma Glucose 226 (*) <117 (mg/dL)  LIPID PANEL      Component Value Range   Cholesterol 192  0 - 200 (mg/dL)   Triglycerides 92  <161 (mg/dL)   HDL 34 (*) >09 (mg/dL)   Total CHOL/HDL Ratio 5.6     VLDL 18  0 - 40 (mg/dL)   LDL Cholesterol 604 (*) 0 - 99 (mg/dL)  CBC      Component Value Range   WBC 6.2  4.0 - 10.5 (K/uL)   RBC 4.55  3.87 - 5.11 (MIL/uL)   Hemoglobin 13.5  12.0 - 15.0 (g/dL)   HCT 54.0  98.1 - 19.1 (%)   MCV 90.8  78.0 - 100.0 (fL)   MCH 29.7  26.0 - 34.0 (pg)   MCHC 32.7  30.0 - 36.0 (g/dL)   RDW 47.8  29.5 - 62.1 (%)   Platelets 193  150 - 400 (K/uL)  BASIC METABOLIC PANEL      Component Value Range   Sodium 141  135 - 145 (mEq/L)   Potassium 3.7  3.5 - 5.1 (mEq/L)   Chloride 103  96 - 112 (mEq/L)   CO2 32  19 - 32 (mEq/L)   Glucose, Bld 217 (*) 70 - 99 (mg/dL)   BUN 10  6 - 23 (mg/dL)   Creatinine, Ser 3.08  0.50 - 1.10 (mg/dL)   Calcium 9.3  8.4 - 65.7 (mg/dL)   GFR calc non Af Amer 90 (*) >90 (mL/min)   GFR calc Af Amer >90  >90 (mL/min)  GLUCOSE, CAPILLARY      Component Value Range   Glucose-Capillary 263 (*) 70 - 99 (mg/dL)  BASIC METABOLIC PANEL      Component Value Range   Sodium 137  135 - 145 (mEq/L)   Potassium 4.3  3.5 - 5.1 (mEq/L)   Chloride 99  96 - 112 (mEq/L)   CO2 32  19 - 32 (mEq/L)   Glucose, Bld 193 (*) 70 - 99 (mg/dL)   BUN 19  6 - 23 (mg/dL)   Creatinine, Ser 8.46  0.50 - 1.10 (mg/dL)   Calcium 9.1  8.4 - 96.2 (mg/dL)   GFR calc non Af Amer 62 (*) >90 (mL/min)   GFR calc Af Amer 72 (*) >90 (mL/min)  GLUCOSE, CAPILLARY      Component Value Range    Glucose-Capillary 258 (*) 70 - 99 (mg/dL)  GLUCOSE, CAPILLARY      Component Value Range   Glucose-Capillary 195 (*) 70 - 99 (mg/dL)  GLUCOSE, CAPILLARY      Component Value Range   Glucose-Capillary 124 (*) 70 - 99 (mg/dL)  GLUCOSE,  CAPILLARY      Component Value Range   Glucose-Capillary 178 (*) 70 - 99 (mg/dL)  GLUCOSE, CAPILLARY      Component Value Range   Glucose-Capillary 104 (*) 70 - 99 (mg/dL)      Dg Chest Port 1 View  03/23/2011  *RADIOLOGY REPORT*  Clinical Data: Weakness and increasing shortness of breath today.  PORTABLE CHEST - 1 VIEW  Comparison: Chest radiograph 12/14/2011and 12/18/2009  Findings: Cardiac silhouette is enlarged and similar to prior portable chest radiograph. Pulmonary vascularity is increased. There is bilateral interstitial prominence.  No definite airspace disease.  Slight atelectasis at both lung bases.  No visible pleural effusion.  There are changes of median sternotomy for CABG.  Left subclavian AICD appears stable on this single view.  IMPRESSION:  1.  Cardiomegaly with pulmonary vascular congestion and interstitial pulmonary edema. 2.  AICD and prior CABG.  Original Report Authenticated By: Britta Mccreedy, M.D.     1. Unstable angina   2. Acute on chronic systolic heart failure       MDM  9:35 PM patient seen and evaluated. Patient in no acute distress.  Patient sees Dr. Anne Fu with [facility]ang cards  10:00 PM Patient discussed with attending physician.  Plan to consult cards.  Spoke with cardiology  They will see pt and admit.     Date: 03/23/2011  Rate: 86  Rhythm: normal sinus rhythm  QRS Axis: left  Intervals: normal  ST/T Wave abnormalities: normal  Conduction Disutrbances:left bundle branch block  Narrative Interpretation:   Old EKG Reviewed: unchanged from 12/18/2009    Angus Seller, PA 03/25/11 1555

## 2011-03-23 NOTE — ED Notes (Signed)
Per pt: intermittent chest pain this week, some immediately before arrival with feeling of shortness of breath. Pt has past hx of heart attacks and is having intermittent chest discomfort at this time. Pt A&O, on 2L O2 at home at night, and is currently feeling shortness of breath.

## 2011-03-23 NOTE — ED Notes (Signed)
Portable CXR to bedside at this time.

## 2011-03-23 NOTE — ED Notes (Signed)
Pt. On monitor. 

## 2011-03-24 ENCOUNTER — Other Ambulatory Visit: Payer: Self-pay

## 2011-03-24 DIAGNOSIS — I5023 Acute on chronic systolic (congestive) heart failure: Principal | ICD-10-CM

## 2011-03-24 LAB — CBC
HCT: 41.3 % (ref 36.0–46.0)
Hemoglobin: 13.5 g/dL (ref 12.0–15.0)
MCV: 90.8 fL (ref 78.0–100.0)
RBC: 4.55 MIL/uL (ref 3.87–5.11)
RDW: 13.9 % (ref 11.5–15.5)
WBC: 6.2 10*3/uL (ref 4.0–10.5)

## 2011-03-24 LAB — BASIC METABOLIC PANEL
BUN: 10 mg/dL (ref 6–23)
CO2: 32 mEq/L (ref 19–32)
Chloride: 103 mEq/L (ref 96–112)
Creatinine, Ser: 0.75 mg/dL (ref 0.50–1.10)
Glucose, Bld: 217 mg/dL — ABNORMAL HIGH (ref 70–99)

## 2011-03-24 LAB — LIPID PANEL: HDL: 34 mg/dL — ABNORMAL LOW (ref >39)

## 2011-03-24 LAB — CARDIAC PANEL(CRET KIN+CKTOT+MB+TROPI)
Relative Index: INVALID (ref 0.0–2.5)
Troponin I: <0.3 ng/mL (ref <0.30)
Troponin I: <0.3 ng/mL (ref <0.30)
Troponin I: <0.3 ng/mL (ref <0.30)

## 2011-03-24 LAB — GLUCOSE, CAPILLARY
Glucose-Capillary: 263 mg/dL — ABNORMAL HIGH (ref 70–99)
Glucose-Capillary: 258 mg/dL — ABNORMAL HIGH (ref 70–99)

## 2011-03-24 LAB — HEMOGLOBIN A1C
Hgb A1c MFr Bld: 9.5 % — ABNORMAL HIGH (ref <5.7)
Mean Plasma Glucose: 226 mg/dL — ABNORMAL HIGH (ref <117)

## 2011-03-24 MED ORDER — ONDANSETRON HCL 4 MG/2ML IJ SOLN: 4.0000 mg | Freq: Four times a day (QID) | INTRAMUSCULAR | Status: DC | PRN

## 2011-03-24 MED ORDER — POTASSIUM CHLORIDE CRYS ER 20 MEQ PO TBCR
20.0000 meq | EXTENDED_RELEASE_TABLET | Freq: Every day | ORAL | Status: DC
  Administered 2011-03-24 – 2011-03-26 (×3): 20 meq via ORAL
  Filled 2011-03-24 (×4): qty 1

## 2011-03-24 MED ORDER — LISINOPRIL 40 MG PO TABS
40.0000 mg | ORAL_TABLET | Freq: Every day | ORAL | Status: DC
  Administered 2011-03-24 – 2011-03-26 (×3): 40 mg via ORAL
  Filled 2011-03-24 (×4): qty 1

## 2011-03-24 MED ORDER — NITROGLYCERIN 0.4 MG SL SUBL: 0.4000 mg | SUBLINGUAL_TABLET | SUBLINGUAL | Status: DC | PRN

## 2011-03-24 MED ORDER — ASPIRIN 81 MG PO CHEW
324.0000 mg | CHEWABLE_TABLET | ORAL | Status: AC
  Administered 2011-03-24: 324 mg via ORAL
  Filled 2011-03-24: qty 4

## 2011-03-24 MED ORDER — INSULIN GLARGINE 100 UNIT/ML ~~LOC~~ SOLN
100.0000 [IU] | Freq: Every day | SUBCUTANEOUS | Status: DC
  Administered 2011-03-24: 100 [IU] via SUBCUTANEOUS

## 2011-03-24 MED ORDER — ASPIRIN 325 MG PO TABS
325.0000 mg | ORAL_TABLET | Freq: Every day | ORAL | Status: DC
  Administered 2011-03-25 – 2011-03-26 (×2): 325 mg via ORAL
  Filled 2011-03-24 (×3): qty 1

## 2011-03-24 MED ORDER — ISOSORBIDE MONONITRATE ER 60 MG PO TB24
60.0000 mg | ORAL_TABLET | Freq: Every day | ORAL | Status: DC
  Administered 2011-03-24: 60 mg via ORAL
  Filled 2011-03-24 (×2): qty 1

## 2011-03-24 MED ORDER — CARVEDILOL 25 MG PO TABS
25.0000 mg | ORAL_TABLET | Freq: Two times a day (BID) | ORAL | Status: DC
  Administered 2011-03-24 – 2011-03-26 (×6): 25 mg via ORAL
  Filled 2011-03-24 (×8): qty 1

## 2011-03-24 MED ORDER — FUROSEMIDE 10 MG/ML IJ SOLN
80.0000 mg | Freq: Two times a day (BID) | INTRAMUSCULAR | Status: DC
  Administered 2011-03-24 (×2): 80 mg via INTRAVENOUS
  Filled 2011-03-24 (×4): qty 8

## 2011-03-24 MED ORDER — ENOXAPARIN SODIUM 40 MG/0.4ML ~~LOC~~ SOLN
40.0000 mg | SUBCUTANEOUS | Status: DC
  Administered 2011-03-24 – 2011-03-26 (×3): 40 mg via SUBCUTANEOUS
  Filled 2011-03-24 (×5): qty 0.4

## 2011-03-24 MED ORDER — INSULIN GLARGINE 100 UNIT/ML ~~LOC~~ SOLN: 100.0000 [IU] | Freq: Every day | SUBCUTANEOUS | Status: DC

## 2011-03-24 MED ORDER — ROSUVASTATIN CALCIUM 20 MG PO TABS
20.0000 mg | ORAL_TABLET | Freq: Every day | ORAL | Status: DC
  Administered 2011-03-24 – 2011-03-26 (×3): 20 mg via ORAL
  Filled 2011-03-24 (×5): qty 1

## 2011-03-24 MED ORDER — INSULIN ASPART 100 UNIT/ML ~~LOC~~ SOLN
40.0000 [IU] | Freq: Three times a day (TID) | SUBCUTANEOUS | Status: DC
  Administered 2011-03-24 – 2011-03-25 (×3): 40 [IU] via SUBCUTANEOUS

## 2011-03-24 MED ORDER — EZETIMIBE 10 MG PO TABS
10.0000 mg | ORAL_TABLET | Freq: Every day | ORAL | Status: DC
  Administered 2011-03-24 – 2011-03-26 (×3): 10 mg via ORAL
  Filled 2011-03-24 (×4): qty 1

## 2011-03-24 MED ORDER — INSULIN ASPART 100 UNIT/ML ~~LOC~~ SOLN
0.0000 [IU] | Freq: Three times a day (TID) | SUBCUTANEOUS | Status: DC
  Administered 2011-03-24: 5 [IU] via SUBCUTANEOUS
  Administered 2011-03-25 – 2011-03-26 (×3): 2 [IU] via SUBCUTANEOUS
  Administered 2011-03-26: 3 [IU] via SUBCUTANEOUS

## 2011-03-24 MED ORDER — ACETAMINOPHEN 325 MG PO TABS: 650.0000 mg | ORAL_TABLET | ORAL | Status: DC | PRN

## 2011-03-24 MED ORDER — LISINOPRIL 20 MG PO TABS
20.0000 mg | ORAL_TABLET | Freq: Every day | ORAL | Status: DC
  Filled 2011-03-24: qty 1

## 2011-03-24 MED ORDER — ASPIRIN 300 MG RE SUPP
300.0000 mg | RECTAL | Status: AC
  Filled 2011-03-24: qty 1

## 2011-03-24 MED ORDER — SPIRONOLACTONE 25 MG PO TABS
25.0000 mg | ORAL_TABLET | Freq: Every day | ORAL | Status: DC
  Administered 2011-03-24 – 2011-03-26 (×3): 25 mg via ORAL
  Filled 2011-03-24 (×4): qty 1

## 2011-03-24 NOTE — ED Notes (Signed)
1st attempt made to call report to 4W. Patsy Lager, RN unavailable at this time. Plan to call back in 10-15 minutes.

## 2011-03-24 NOTE — H&P (Signed)
[name] is an 61 y.o. female.    Chief Complaint: Exertional chest pain and shortness of breath for 1 month  HPI: 61 y/o female with a PMH of IDDM, CAD s/p CABG x 5 in 2007, ischemic cardiomyopathy with NYHA class III, LVEF 30-35 by echo from 12/19/2009 presenting for evaluation of 1 month history of exertional chest pain and dyspnea on exertion.  Her chest pain occurs intermittently.  It is described as a sharp pain, 8/10 in severity, it lasts for a few minutes before spontaneous relief.  She has noted that she can only sleep with several pillows before she feels comfortable breathing. She states that she has been using home oxygen since 2011. She does have sleep apnea, but has not gotten her CPAP machine at this point in time.  In ED, her first set of cardiac markers are negative, and her ECG shows sinus rhythm with LBBB which is old when compared to prior ECG from 12/18/2009. Clinically, she has JVD and appears to be short of breath during history taking.  Past Medical History  Diagnosis Date  . Coronary artery disease     s/p CABG 2007  . Sleep apnea     uses O2 at night  . Ischemic cardiomyopathy     EF 30-35%, s/p ICD 4/08 by Dr Amil Amen  . Diabetes mellitus     type II  . Hypertension   . Obesity     Past Surgical History  Procedure Date  . Cardiac catheterization   . Cardiac defibrillator placement 4/08    by Dr Amil Amen (MDT)  . Coronary artery bypass graft 2007    Family History  Problem Relation Age of Onset  . Diabetes Mother 53    died - HTN  . Other      No early family hx of CAD   Social History:  reports that she has never smoked. She does not have any smokeless tobacco history on file. She reports that she does not drink alcohol or use illicit drugs.  Allergies: No Known Allergies  Medications Prior to Admission  Medication Dose Route Frequency Provider Last Rate Last Dose  . furosemide (LASIX) injection 40 mg  40 mg Intravenous Once Angus Seller, PA    40 mg at 03/23/11 2318   Medications Prior to Admission  Medication Sig Dispense Refill  . aspirin 325 MG tablet Take 325 mg by mouth daily.        Marland Kitchen atorvastatin (LIPITOR) 20 MG tablet Take 80 mg by mouth daily.       . carvedilol (COREG) 12.5 MG tablet Take 25 mg by mouth 2 (two) times daily with a meal.       . ezetimibe (ZETIA) 10 MG tablet Take 10 mg by mouth daily.        . furosemide (LASIX) 80 MG tablet Take 80 mg by mouth 2 (two) times daily.        . insulin glargine (LANTUS) 100 UNIT/ML injection Inject 100 Units into the skin at bedtime.       . insulin lispro (HUMALOG) 100 UNIT/ML injection Inject 40 Units into the skin 3 (three) times daily after meals.       Marland Kitchen lisinopril (PRINIVIL,ZESTRIL) 5 MG tablet Take 20 mg by mouth daily.       . potassium chloride SA (K-DUR,KLOR-CON) 20 MEQ tablet Take 20 mEq by mouth daily.        Marland Kitchen spironolactone (ALDACTONE) 25 MG tablet Take 25 mg  by mouth daily.          Results for orders placed during the hospital encounter of 03/23/11 (from the past 48 hour(s))  CBC     Status: Normal   Collection Time   03/23/11  9:42 PM      Component Value Range Comment   WBC 6.8  4.0 - 10.5 (K/uL)    RBC 4.77  3.87 - 5.11 (MIL/uL)    Hemoglobin 14.1  12.0 - 15.0 (g/dL)    HCT 87.5  64.3 - 32.9 (%)    MCV 90.8  78.0 - 100.0 (fL)    MCH 29.6  26.0 - 34.0 (pg)    MCHC 32.6  30.0 - 36.0 (g/dL)    RDW 51.8  84.1 - 66.0 (%)    Platelets 195  150 - 400 (K/uL)   BASIC METABOLIC PANEL     Status: Abnormal   Collection Time   03/23/11  9:42 PM      Component Value Range Comment   Sodium 139  135 - 145 (mEq/L)    Potassium 4.9  3.5 - 5.1 (mEq/L)    Chloride 101  96 - 112 (mEq/L)    CO2 30  19 - 32 (mEq/L)    Glucose, Bld 302 (*) 70 - 99 (mg/dL)    BUN 10  6 - 23 (mg/dL)    Creatinine, Ser 6.30  0.50 - 1.10 (mg/dL)    Calcium 9.6  8.4 - 10.5 (mg/dL)    GFR calc non Af Amer 71 (*) >90 (mL/min)    GFR calc Af Amer 83 (*) >90 (mL/min)   PRO B NATRIURETIC  PEPTIDE     Status: Abnormal   Collection Time   03/23/11  9:42 PM      Component Value Range Comment   Pro B Natriuretic peptide (BNP) 803.9 (*) 0 - 125 (pg/mL)   TROPONIN I     Status: Normal   Collection Time   03/23/11  9:42 PM      Component Value Range Comment   Troponin I <0.30  <0.30 (ng/mL)   PROTIME-INR     Status: Normal   Collection Time   03/23/11  9:42 PM      Component Value Range Comment   Prothrombin Time 13.8  11.6 - 15.2 (seconds)    INR 1.04  0.00 - 1.49     Dg Chest Port 1 View  03/23/2011  *RADIOLOGY REPORT*  Clinical Data: Weakness and increasing shortness of breath today.  PORTABLE CHEST - 1 VIEW  Comparison: Chest radiograph 12/14/2011and 12/18/2009  Findings: Cardiac silhouette is enlarged and similar to prior portable chest radiograph. Pulmonary vascularity is increased. There is bilateral interstitial prominence.  No definite airspace disease.  Slight atelectasis at both lung bases.  No visible pleural effusion.  There are changes of median sternotomy for CABG.  Left subclavian AICD appears stable on this single view.  IMPRESSION:  1.  Cardiomegaly with pulmonary vascular congestion and interstitial pulmonary edema. 2.  AICD and prior CABG.  Original Report Authenticated By: Britta Mccreedy, M.D.    Review of Systems  Constitutional: Negative for fever, chills, weight loss, malaise/fatigue and diaphoresis.  HENT: Negative for hearing loss, ear pain, nosebleeds, congestion, neck pain, tinnitus and ear discharge.   Eyes: Negative for blurred vision, double vision, photophobia, pain, discharge and redness.  Respiratory: Positive for shortness of breath. Negative for cough, hemoptysis, sputum production, wheezing and stridor.   Cardiovascular: Positive for chest pain. Negative for  palpitations, orthopnea, claudication, leg swelling and PND.  Gastrointestinal: Negative for heartburn, nausea, vomiting, abdominal pain, diarrhea, constipation and blood in stool.    Genitourinary: Negative for dysuria, urgency, frequency and hematuria.  Musculoskeletal: Negative for myalgias and back pain.  Skin: Positive for rash.  Neurological: Negative for dizziness, tingling, tremors, weakness and headaches.  Psychiatric/Behavioral: Negative for depression and hallucinations.    Blood pressure 158/81, pulse 101, temperature 98.6 F (37 C), temperature source Oral, resp. rate 22, SpO2 98.00%. Physical Exam  Constitutional: She is oriented to person, place, and time. She appears well-developed and well-nourished. She appears distressed.  HENT:  Head: Normocephalic and atraumatic.  Eyes: EOM are normal. Pupils are equal, round, and reactive to light. Right eye exhibits no discharge. Left eye exhibits no discharge.  Neck: Normal range of motion. JVD present. No tracheal deviation present. No thyromegaly present.  Cardiovascular: Normal rate, regular rhythm and normal heart sounds.  Exam reveals no gallop and no friction rub.   No murmur heard. Respiratory: No stridor. No respiratory distress. She has no wheezes. She has no rales. She exhibits no tenderness.  GI: Soft. Bowel sounds are normal. She exhibits distension. There is no tenderness. There is no rebound.  Musculoskeletal: She exhibits no edema.  Neurological: She is alert and oriented to person, place, and time. No cranial nerve deficit. Coordination normal.  Skin: Skin is dry. Rash (macular rash in chest wall and underneath her breath bilaterally (intertriginous areas)) noted. She is not diaphoretic.  Psychiatric: She has a normal mood and affect.    ECG: sinus rhythm with LBBB (old when compared to prior ECG from 12/18/2009).  Assessment/Plan   1. Acute on chronic CHF exacerbation 2. CAD s/p CABG x 5 in 2007 3. IDDM 4. Sleep apna (needs CPAP) 5. Obesity 6. Intertriginous rash  Will admit to the hospital for intravenous diuresis.  I will re-start her CHF medication and obtain a 2-D echocardiogram to  re-evaluate her left and right ventricular systolic function.  I will obtain serial cardiac markers to determine if she rules in for myocardial infarction.  I will not start heparin because I think her symptoms are mostly related to CHF exacerbation.  She will need to be evaluated for a CPAP since she has sleep apnea.  If she rules in for MI, will start the patient on heparin and begin ischemia workup.  She may benefit from dermatology consult to evaluate her intertriginous rash.   Syrah Daughtrey E 03/24/2011, 1:37 AM

## 2011-03-24 NOTE — ED Notes (Signed)
Cardiology MD at bedside.

## 2011-03-24 NOTE — Progress Notes (Signed)
  Echocardiogram 2D Echocardiogram has been performed.  Jorje Guild [facility] 03/24/2011, 8:05 AM

## 2011-03-24 NOTE — Progress Notes (Signed)
@   Subjective:  Denies CP; dyspnea improving   Objective:  Filed Vitals:   03/24/11 0313 03/24/11 0359 03/24/11 0415 03/24/11 0523  BP: 145/73 159/89 159/89 164/97  Pulse: 86 86 86 86  Temp: 98.6 F (37 C) 98.9 F (37.2 C) 98.9 F (37.2 C) 98.2 F (36.8 C)  TempSrc: Oral Oral Oral Oral  Resp: 22 20 20 20   Height:  5\' 4"  (1.626 m)    Weight:  266 lb 15.6 oz (121.1 kg)  266 lb 15.6 oz (121.1 kg)  SpO2: 100% 96% 96% 96%    Intake/Output from previous day:  Intake/Output Summary (Last 24 hours) at 03/24/11 0829 Last data filed at 03/24/11 0200  Gross per 24 hour  Intake      0 ml  Output    275 ml  Net   -275 ml    Physical Exam: Physical exam: Well-developed obese in no acute distress.  Skin is warm and dry.  HEENT is normal.  Neck is supple. Chest with minimal decreased BS bases Cardiovascular exam is regular rate and rhythm.  Abdominal exam nontender or distended. No masses palpated. Extremities show trace edema. neuro grossly intact    Lab Results: Basic Metabolic Panel:  Basename 03/24/11 0528 03/23/11 2142  NA 141 139  K 3.7 4.9  CL 103 101  CO2 32 30  GLUCOSE 217* 302*  BUN 10 10  CREATININE 0.75 0.86  CALCIUM 9.3 9.6  MG -- --  PHOS -- --   CBC:  Basename 03/24/11 0528 03/23/11 2142  WBC 6.2 6.8  NEUTROABS -- --  HGB 13.5 14.1  HCT 41.3 43.3  MCV 90.8 90.8  PLT 193 195   Cardiac Enzymes:  Basename 03/24/11 0528 03/23/11 2142  CKTOTAL 85 --  CKMB 2.8 --  CKMBINDEX -- --  TROPONINI <0.30 <0.30     Assessment/Plan:  1) Acute on chronic systolic CHF - continue diuresis and follow renal function; check echocardiogram 2) CP - symptoms concerning for angina; may need cath; enzymes neg; will discuss with Dr Anne Fu 3) ICM- continue beta blocker and change lisinopril to 40 mg po daily 4) HTN - BP elevated; increase lisinopril 5) DM 6) OSA  Olga Millers 03/24/2011, 8:29 AM

## 2011-03-25 ENCOUNTER — Other Ambulatory Visit: Payer: Self-pay

## 2011-03-25 LAB — GLUCOSE, CAPILLARY: Glucose-Capillary: 204 mg/dL — ABNORMAL HIGH (ref 70–99)

## 2011-03-25 LAB — BASIC METABOLIC PANEL
Calcium: 9.1 mg/dL (ref 8.4–10.5)
Chloride: 99 mEq/L (ref 96–112)
Creatinine, Ser: 0.97 mg/dL (ref 0.50–1.10)
GFR calc Af Amer: 72 mL/min — ABNORMAL LOW (ref >90)
GFR calc non Af Amer: 62 mL/min — ABNORMAL LOW (ref >90)

## 2011-03-25 MED ORDER — INSULIN ASPART 100 UNIT/ML ~~LOC~~ SOLN: 20.0000 [IU] | Freq: Once | SUBCUTANEOUS | Status: DC

## 2011-03-25 MED ORDER — INSULIN GLARGINE 100 UNIT/ML ~~LOC~~ SOLN
50.0000 [IU] | Freq: Once | SUBCUTANEOUS | Status: AC
  Administered 2011-03-25: 50 [IU] via SUBCUTANEOUS

## 2011-03-25 MED ORDER — FUROSEMIDE 80 MG PO TABS
80.0000 mg | ORAL_TABLET | Freq: Two times a day (BID) | ORAL | Status: DC
  Administered 2011-03-25 – 2011-03-26 (×3): 80 mg via ORAL
  Filled 2011-03-25 (×6): qty 1

## 2011-03-25 NOTE — Progress Notes (Signed)
@   Subjective:  Denies CP; dyspnea resolved   Objective:  Filed Vitals:   03/24/11 0523 03/24/11 1401 03/24/11 2143 03/25/11 0517  BP: 164/97 96/59 94/59  101/62  Pulse: 86 64 63 64  Temp: 98.2 F (36.8 C) 97.7 F (36.5 C) 98.4 F (36.9 C) 98.4 F (36.9 C)  TempSrc: Oral Oral Oral Oral  Resp: 20 18 18 18   Height:      Weight: 266 lb 15.6 oz (121.1 kg)   272 lb 11.3 oz (123.7 kg)  SpO2: 96% 94% 96% 97%    Intake/Output from previous day:  Intake/Output Summary (Last 24 hours) at 03/25/11 0709 Last data filed at 03/24/11 1739  Gross per 24 hour  Intake    376 ml  Output    600 ml  Net   -224 ml    Physical Exam: Physical exam: Well-developed obese in no acute distress.  Skin is warm and dry.  HEENT is normal.  Neck is supple. Chest CTA Cardiovascular exam is regular rate and rhythm.  Abdominal exam nontender or distended. No masses palpated. Extremities show no edema. neuro grossly intact    Lab Results: Basic Metabolic Panel:  Basename 03/24/11 0528 03/23/11 2142  NA 141 139  K 3.7 4.9  CL 103 101  CO2 32 30  GLUCOSE 217* 302*  BUN 10 10  CREATININE 0.75 0.86  CALCIUM 9.3 9.6  MG -- --  PHOS -- --   CBC:  Basename 03/24/11 0528 03/23/11 2142  WBC 6.2 6.8  NEUTROABS -- --  HGB 13.5 14.1  HCT 41.3 43.3  MCV 90.8 90.8  PLT 193 195   Cardiac Enzymes:  Basename 03/24/11 1605 03/24/11 1025 03/24/11 0528  CKTOTAL 72 87 85  CKMB 2.5 3.2 2.8  CKMBINDEX -- -- --  TROPONINI <0.30 <0.30 <0.30     Assessment/Plan:  1) Acute on chronic systolic CHF - Symptoms improved; change lasix to 80 mg po BID; await AM BMET; echo shows EF 25-30; continue ACEI and beta blocker 2) CP - symptoms on admission concerning for angina; may need cath (or at least functional study); enzymes neg; will discuss with Dr Anne Fu; Keep NPO after MN. 3) ICM- continue beta blocker and change lisinopril to 40 mg po daily 4) HTN - BP improved and now borderline; DC imdur 5) DM 6)  OSA  Olga Millers 03/25/2011, 7:09 AM

## 2011-03-25 NOTE — Progress Notes (Signed)
Spoke with Dr. Donnie Aho regarding patient's cbg's and after meal coverage. CBG was 103 before dinner, patient had scheduled novolog 40 units after dinner. New order for one time dose of Novolog 20 units after dinner and not to give 40 units of novolog. Will continue to monitor patient.

## 2011-03-25 NOTE — ED Provider Notes (Signed)
Medical screening examination/treatment/procedure(s) were performed by non-physician practitioner and as supervising physician I was immediately available for consultation/collaboration.   Cydnee Fuquay A. Kerstie Agent, MD 03/25/11 2349 

## 2011-03-26 LAB — GLUCOSE, CAPILLARY
Glucose-Capillary: 202 mg/dL — ABNORMAL HIGH (ref 70–99)
Glucose-Capillary: 157 mg/dL — ABNORMAL HIGH (ref 70–99)

## 2011-03-26 LAB — BASIC METABOLIC PANEL
BUN: 18 mg/dL (ref 6–23)
CO2: 32 mEq/L (ref 19–32)
Chloride: 97 mEq/L (ref 96–112)
Creatinine, Ser: 0.89 mg/dL (ref 0.50–1.10)
Glucose, Bld: 195 mg/dL — ABNORMAL HIGH (ref 70–99)

## 2011-03-26 MED ORDER — LIVING WELL WITH DIABETES BOOK: 1.0000 | Freq: Once | Status: DC

## 2011-03-26 MED ORDER — NITROGLYCERIN 0.4 MG SL SUBL: 0.4000 mg | SUBLINGUAL_TABLET | SUBLINGUAL | Status: DC | PRN

## 2011-03-26 MED ORDER — LIVING WELL WITH DIABETES BOOK
Freq: Once | Status: DC
  Filled 2011-03-26 (×2): qty 1

## 2011-03-26 NOTE — Progress Notes (Signed)
61 year old BF admitted with unstable angina.  Hx of IDDM - sees Dr. Sharl Ma.  On Lantus 100 units QHS and Humalog 40 units tidwc.  Pt states she occasionally has hypoglycemia during the night.  Lantus was increased from 80 units to 100 units at last office visit.  Has appt. With Dr. Sharl Ma in the next week.  May need to decrease lantus to 90 units QHS.  Pt checks blood sugars twice a day and when she feel "funny." Treats low blood sugar with juice.  Interested in attending OP Diabetes Ed class.  She went years ago and would like to go back.  Orders received for same.  Answered questions and will order Living Well with Diabetes book from pharmacy.  CBGs today - 202, 157.  Thank you.

## 2011-03-26 NOTE — Discharge Summary (Signed)
Patient ID: [name] MRN: [id number] DOB/AGE: 1950/07/01 61 y.o.  Admit date: 03/23/2011 Discharge date: 03/26/2011  Primary Discharge Diagnosis: Acute on chronic systolic heart failure  Secondary Discharge Diagnosis: Angina, coronary artery disease, morbid obesity, diabetes, hypertension  Significant Diagnostic Studies:  Echocardiogram: - Left ventricle: The cavity size was mildly dilated. Systolic function was severely reduced. The estimated ejection fraction was in the range of 25% to 30%. Akinesis of the anteroseptal, anterior, and apical myocardium. - Left atrium: The atrium was mildly to moderately dilated. - Pulmonary arteries: Systolic pressure was mildly increased. PA peak pressure: 32mm Hg (S).  Clinical Data: Nontoxic multinodular goiter. Thyroid nodules, post  FNA biopsy of two dominant lesions.  THYROID ULTRASOUND  Technique: Ultrasound examination of the thyroid gland and adjacent  soft tissues was performed.  Comparison: 02/24/2010 and earlier studies  Findings:  Right thyroid lobe: 11 x 13 x 44 mm, inhomogeneous  Left thyroid lobe: 25 x 31 x 45 mm  Isthmus: 2.7 mm in thickness  Focal nodules: 7 x 15 x 16 mm complex mostly solid, left isthmus  (previously 5 x 13 x 15)  9 x 12 x 13 mm complex mostly cystic, medial left (previously 12 x  17 x 19)  21 x 29 x 40 mm solid, mid-left (previously 24 x 29 x 39)  There are additional smaller nodules bilaterally, all less than 8  mm maximal diameter.  Lymphadenopathy: None visualized.  IMPRESSION:  1. No significant increase in size of bilateral thyroid nodules.  Correlate with previous biopsy results.  CXR: 1. Cardiomegaly with pulmonary vascular congestion and  interstitial pulmonary edema.  2. AICD and prior CABG.   Telemetry-bundle branch block, sinus rhythm, no other abnormalities  Hospital Course: On ADMIT HPI: 61 y/o female with a PMH of IDDM, CAD s/p CABG x 5 in 2007, ischemic cardiomyopathy with  NYHA class III, LVEF 30-35 by echo from 12/19/2009 presenting for evaluation of 1 month history of exertional chest pain and dyspnea on exertion. Her chest pain occurs intermittently. It is described as a sharp pain, 8/10 in severity, it lasts for a few minutes before spontaneous relief. She has noted that she can only sleep with several pillows before she feels comfortable breathing. She states that she has been using home oxygen since 2011. She does have sleep apnea, but has not gotten her CPAP machine at this point in time. In ED, her first set of cardiac markers are negative, and her ECG shows sinus rhythm with LBBB which is old when compared to prior ECG from 12/18/2009. Clinically, she has JVD and appears to be short of breath during history taking.  She was placed on IV diuresis. He did well. Currently she is on by mouth Lasix at her home dose of 80 mg twice a day and feels back at her baseline. She is very eager to go home. She has not had any return of her chest discomfort. Her isosorbide was discontinued over the weekend however I will reinitiate on discharge. I do believe that this may be helpful in the setting of her angina/underlying CAD.  In regards to her diabetes, her hemoglobin A1c was 9.6-uncontrolled diabetes. She is also morbidly obese. She has sleep apnea. On CPAP.  Lisinopril was increased to 40 mg a day during hospitalization.  Cardiac markers were all negative. No evidence of myocardial infarction. Because of her symptoms previous to admission of possible angina, I am setting her up for a 2 day pharmacologic stress test. If  her symptoms become more worrisome until then, we will proceed with cardiac catheterization.  Her isosorbide was stopped over the weekend because of borderline low blood pressures. This has now increased. Restart.  Both she and her daughter are comfortable with her being discharge. I have establish close followup in less than 7 days.   Discharge Exam: Blood  pressure 154/79, pulse 74, temperature 99.3 F (37.4 C), temperature source Oral, resp. rate 18, height 5\' 4"  (1.626 m), weight 121.5 kg (267 lb 13.7 oz), SpO2 98.00%.    General: Alert and oriented x3 in no acute distress, morbidly obese, laying comfortably in bed, sleeping when I arrived in the room but easily arousable. Daughter present. Cardiovascular: Regular rate and rhythm no gallop, no appreciable murmurs. Lungs: Clear to auscultation bilaterally Abdomen: Morbidly obese soft, nontender Extremities: Chronic edema bilaterally, trace to 1+ edema bilaterally.  Labs:   Lab Results  Component Value Date   WBC 6.2 03/24/2011   HGB 13.5 03/24/2011   HCT 41.3 03/24/2011   MCV 90.8 03/24/2011   PLT 193 03/24/2011    Lab 03/26/11 0440  NA 136  K 4.3  CL 97  CO2 32  BUN 18  CREATININE 0.89  CALCIUM 9.5  PROT --  BILITOT --  ALKPHOS --  ALT --  AST --  GLUCOSE 195*   Lab Results  Component Value Date   CKTOTAL 72 03/24/2011   CKMB 2.5 03/24/2011   TROPONINI <0.30 03/24/2011    Lab Results  Component Value Date   CHOL 192 03/24/2011   CHOL  Value: 130        ATP III CLASSIFICATION:  <200     mg/dL   Desirable  191-478  mg/dL   Borderline High  >=295    mg/dL   High        62/13/0865   Lab Results  Component Value Date   HDL 34* 03/24/2011   HDL 28* 12/19/2009   Lab Results  Component Value Date   LDLCALC 140* 03/24/2011   LDLCALC  Value: 83        Total Cholesterol/HDL:CHD Risk Coronary Heart Disease Risk Table                     Men   Women  1/2 Average Risk   3.4   3.3  Average Risk       5.0   4.4  2 X Average Risk   9.6   7.1  3 X Average Risk  23.4   11.0        Use the calculated Patient Ratio above and the CHD Risk Table to determine the patient's CHD Risk.        ATP III CLASSIFICATION (LDL):  <100     mg/dL   Optimal  784-696  mg/dL   Near or Above                    Optimal  130-159  mg/dL   Borderline  295-284  mg/dL   High  >132     mg/dL   Very High 44/01/270    Lab Results  Component Value Date   TRIG 92 03/24/2011   TRIG 97 12/19/2009   Lab Results  Component Value Date   CHOLHDL 5.6 03/24/2011   CHOLHDL 4.6 12/19/2009   No results found for this basename: LDLDIRECT     Her LDL is elevated, her goal is less than 70.  She may need  further titration of her medications.  I also want her to followup with her primary physician. FOLLOW UP PLANS AND APPOINTMENTS Discharge Orders    Future Orders Please Complete By Expires   Diet - low sodium heart healthy      Increase activity slowly        Medication List  As of 03/26/2011 12:35 PM   TAKE these medications         aspirin 325 MG tablet   Take 325 mg by mouth daily.      atorvastatin 20 MG tablet   Commonly known as: LIPITOR   Take 80 mg by mouth daily.      carvedilol 12.5 MG tablet   Commonly known as: COREG   Take 25 mg by mouth 2 (two) times daily with a meal.      ezetimibe 10 MG tablet   Commonly known as: ZETIA   Take 10 mg by mouth daily.      furosemide 80 MG tablet   Commonly known as: LASIX   Take 80 mg by mouth 2 (two) times daily.      HUMALOG 100 UNIT/ML injection   Generic drug: insulin lispro   Inject 40 Units into the skin 3 (three) times daily after meals.      hydrocortisone cream 1 %   Apply 1 application topically 2 (two) times daily as needed. For rash      isosorbide mononitrate 60 MG 24 hr tablet   Commonly known as: IMDUR   Take 60 mg by mouth daily.      LANTUS 100 UNIT/ML injection   Generic drug: insulin glargine   Inject 100 Units into the skin at bedtime.      lisinopril 5 MG tablet   Commonly known as: PRINIVIL,ZESTRIL   Take 20 mg by mouth daily.      nitroGLYCERIN 0.4 MG SL tablet   Commonly known as: NITROSTAT   Place 1 tablet (0.4 mg total) under the tongue every 5 (five) minutes x 3 doses as needed for chest pain.      potassium chloride SA 20 MEQ tablet   Commonly known as: K-DUR,KLOR-CON   Take 20 mEq by mouth daily.       spironolactone 25 MG tablet   Commonly known as: ALDACTONE   Take 25 mg by mouth daily.           Follow-up Information    Follow up with FERGUSON,CYNTHIA A, NP on 04/03/2011. (9:00am)    Contact information:   [facility] [address], [address] [place] [address] [phone]         She will also have a nuclear stress test set up. My office will be contacting her. Please contact us if your shortness of breath worsens.  Diabetes needs tighter control. Continue to strive for weight loss.  BRING ALL MEDICATIONS WITH YOU TO FOLLOW UP APPOINTMENTS  Time spent with patient to include physician time: 40 minutes spent with patient, daughter, Comptroller, education, examination, review of medical records, dictation.   SignedDonato Schultz 03/26/2011, 12:35 PM

## 2011-03-28 LAB — GLUCOSE, CAPILLARY: Glucose-Capillary: 155 mg/dL — ABNORMAL HIGH (ref 70–99)

## 2011-06-13 ENCOUNTER — Ambulatory Visit (INDEPENDENT_AMBULATORY_CARE_PROVIDER_SITE_OTHER): Payer: Medicaid Other | Admitting: *Deleted

## 2011-06-13 ENCOUNTER — Encounter: Payer: Self-pay | Admitting: Internal Medicine

## 2011-06-13 DIAGNOSIS — I255 Ischemic cardiomyopathy: Secondary | ICD-10-CM

## 2011-06-13 DIAGNOSIS — I509 Heart failure, unspecified: Secondary | ICD-10-CM

## 2011-06-13 DIAGNOSIS — I2589 Other forms of chronic ischemic heart disease: Secondary | ICD-10-CM

## 2011-06-13 DIAGNOSIS — I5022 Chronic systolic (congestive) heart failure: Secondary | ICD-10-CM

## 2011-06-13 LAB — ICD DEVICE OBSERVATION
AL IMPEDENCE ICD: 632 Ohm
AL THRESHOLD: 0.5 V
BATTERY VOLTAGE: 2.99 V
CHARGE TIME: 9.419 s
DEV-0020ICD: NEGATIVE
PACEART VT: 0
TOT-0001: 1
TOT-0002: 0
TOT-0006: 20080423000000
TZAT-0001ATACH: 1
TZAT-0001ATACH: 3
TZAT-0001FASTVT: 1
TZAT-0001SLOWVT: 1
TZAT-0001SLOWVT: 2
TZAT-0002ATACH: NEGATIVE
TZAT-0002ATACH: NEGATIVE
TZAT-0002FASTVT: NEGATIVE
TZAT-0005SLOWVT: 88 pct
TZAT-0005SLOWVT: 91 pct
TZAT-0012ATACH: 150 ms
TZAT-0013SLOWVT: 1
TZAT-0013SLOWVT: 1
TZAT-0018ATACH: NEGATIVE
TZAT-0018ATACH: NEGATIVE
TZAT-0018SLOWVT: NEGATIVE
TZAT-0018SLOWVT: NEGATIVE
TZAT-0019ATACH: 6 V
TZAT-0019ATACH: 6 V
TZAT-0019FASTVT: 8 V
TZAT-0019SLOWVT: 8 V
TZAT-0019SLOWVT: 8 V
TZAT-0020ATACH: 1.5 ms
TZON-0004SLOWVT: 20
TZON-0005SLOWVT: 12
TZST-0001ATACH: 5
TZST-0001ATACH: 6
TZST-0001FASTVT: 3
TZST-0001FASTVT: 4
TZST-0001FASTVT: 5
TZST-0001SLOWVT: 3
TZST-0001SLOWVT: 4
TZST-0001SLOWVT: 6
TZST-0002ATACH: NEGATIVE
TZST-0002ATACH: NEGATIVE
TZST-0002ATACH: NEGATIVE
TZST-0002FASTVT: NEGATIVE
TZST-0002FASTVT: NEGATIVE
TZST-0002FASTVT: NEGATIVE
TZST-0002FASTVT: NEGATIVE
TZST-0003SLOWVT: 35 J
VENTRICULAR PACING ICD: 0 pct

## 2011-06-13 NOTE — Progress Notes (Signed)
ICD check with ICM 

## 2011-09-28 ENCOUNTER — Encounter: Payer: Medicaid Other | Admitting: Internal Medicine

## 2011-10-09 ENCOUNTER — Encounter: Payer: Medicaid Other | Admitting: Cardiology

## 2011-10-10 ENCOUNTER — Ambulatory Visit (INDEPENDENT_AMBULATORY_CARE_PROVIDER_SITE_OTHER): Payer: Medicaid Other | Admitting: Internal Medicine

## 2011-10-10 ENCOUNTER — Encounter: Payer: Self-pay | Admitting: Internal Medicine

## 2011-10-10 ENCOUNTER — Encounter: Payer: Medicaid Other | Admitting: Cardiology

## 2011-10-10 VITALS — BP 114/80 | HR 76 | Ht 65.0 in | Wt 265.4 lb

## 2011-10-10 DIAGNOSIS — I10 Essential (primary) hypertension: Secondary | ICD-10-CM

## 2011-10-10 DIAGNOSIS — I255 Ischemic cardiomyopathy: Secondary | ICD-10-CM

## 2011-10-10 DIAGNOSIS — I2510 Atherosclerotic heart disease of native coronary artery without angina pectoris: Secondary | ICD-10-CM

## 2011-10-10 DIAGNOSIS — I2589 Other forms of chronic ischemic heart disease: Secondary | ICD-10-CM

## 2011-10-10 DIAGNOSIS — I5022 Chronic systolic (congestive) heart failure: Secondary | ICD-10-CM

## 2011-10-10 DIAGNOSIS — I509 Heart failure, unspecified: Secondary | ICD-10-CM

## 2011-10-10 LAB — ICD DEVICE OBSERVATION
AL IMPEDENCE ICD: 600 Ohm
AL THRESHOLD: 1 V
DEV-0020ICD: NEGATIVE
RV LEAD IMPEDENCE ICD: 472 Ohm
TZAT-0001ATACH: 1
TZAT-0002ATACH: NEGATIVE
TZAT-0002ATACH: NEGATIVE
TZAT-0002ATACH: NEGATIVE
TZAT-0002FASTVT: NEGATIVE
TZAT-0005SLOWVT: 88 pct
TZAT-0005SLOWVT: 91 pct
TZAT-0011SLOWVT: 10 ms
TZAT-0011SLOWVT: 10 ms
TZAT-0012ATACH: 150 ms
TZAT-0012SLOWVT: 200 ms
TZAT-0013SLOWVT: 1
TZAT-0013SLOWVT: 1
TZAT-0018ATACH: NEGATIVE
TZAT-0018ATACH: NEGATIVE
TZAT-0018ATACH: NEGATIVE
TZAT-0018SLOWVT: NEGATIVE
TZAT-0018SLOWVT: NEGATIVE
TZAT-0019ATACH: 6 V
TZAT-0019ATACH: 6 V
TZAT-0019FASTVT: 8 V
TZAT-0020ATACH: 1.5 ms
TZAT-0020FASTVT: 1.5 ms
TZON-0003ATACH: 350 ms
TZON-0003SLOWVT: 350 ms
TZON-0004SLOWVT: 20
TZON-0005SLOWVT: 12
TZST-0001ATACH: 4
TZST-0001ATACH: 5
TZST-0001FASTVT: 3
TZST-0001SLOWVT: 4
TZST-0001SLOWVT: 6
TZST-0002ATACH: NEGATIVE
TZST-0002FASTVT: NEGATIVE
TZST-0002FASTVT: NEGATIVE
TZST-0002FASTVT: NEGATIVE
TZST-0003SLOWVT: 35 J
TZST-0003SLOWVT: 35 J
VENTRICULAR PACING ICD: 0.1 pct

## 2011-10-10 NOTE — Patient Instructions (Addendum)
Your physician recommends that you schedule a follow-up appointment in: 3 months with device clinic.  Your physician wants you to follow-up in: 1 year with Dr Allred.  You will receive a reminder letter in the mail two months in advance. If you don't receive a letter, please call our office to schedule the follow-up appointment.   

## 2011-10-10 NOTE — Progress Notes (Signed)
Primary Cardiologist:  Dr Johnsie Kindred is a 61 y.o. female who presents today for routine electrophysiology followup.  Since last being seen in our clinic, the patient reports doing reasonably well.  She reports stable dyspnea with moderate activity.  Today, she denies symptoms of palpitations, chest pain, shortness of breath at rest,  lower extremity edema, dizziness, presyncope, syncope, or ICD shocks.  The patient is otherwise without complaint today.   Past Medical History  Diagnosis Date  . Coronary artery disease     s/p CABG 2007  . Sleep apnea     uses O2 at night  . Ischemic cardiomyopathy     EF 30-35%, s/p ICD 4/08 by Dr Amil Amen  . Diabetes mellitus     type II  . Hypertension   . Obesity    Past Surgical History  Procedure Date  . Cardiac catheterization   . Cardiac defibrillator placement 4/08    by Dr Amil Amen (MDT)  . Coronary artery bypass graft 2007    Current Outpatient Prescriptions  Medication Sig Dispense Refill  . aspirin 325 MG tablet Take 325 mg by mouth daily.        Marland Kitchen atorvastatin (LIPITOR) 20 MG tablet Take 80 mg by mouth daily.       . carvedilol (COREG) 12.5 MG tablet Take 25 mg by mouth 2 (two) times daily with a meal.       . furosemide (LASIX) 80 MG tablet Take 80 mg by mouth 2 (two) times daily.        . hydrocortisone cream 1 % Apply 1 application topically 2 (two) times daily as needed. For rash      . insulin glargine (LANTUS) 100 UNIT/ML injection Inject 90 Units into the skin at bedtime.       . insulin lispro (HUMALOG) 100 UNIT/ML injection Inject 54 Units into the skin 3 (three) times daily after meals.       . isosorbide mononitrate (IMDUR) 60 MG 24 hr tablet Take 60 mg by mouth daily.      Marland Kitchen lisinopril (PRINIVIL,ZESTRIL) 5 MG tablet Take 20 mg by mouth daily.       Marland Kitchen living well with diabetes book MISC 1 each by Does not apply route once.  1 each  0  . nitroGLYCERIN (NITROSTAT) 0.4 MG SL tablet Place 1 tablet (0.4 mg total) under  the tongue every 5 (five) minutes x 3 doses as needed for chest pain.  30 tablet  12  . potassium chloride SA (K-DUR,KLOR-CON) 20 MEQ tablet Take 20 mEq by mouth daily.        Marland Kitchen spironolactone (ALDACTONE) 25 MG tablet Take 25 mg by mouth daily.          Physical Exam: Filed Vitals:   10/10/11 1351  BP: 114/80  Pulse: 76  Height: 5\' 5"  (1.651 m)  Weight: 265 lb 6.4 oz (120.385 kg)    GEN- The patient is well appearing, alert and oriented x 3 today.   Head- normocephalic, atraumatic Eyes-  Sclera clear, conjunctiva pink Ears- hearing intact Oropharynx- clear Lungs- Clear to ausculation bilaterally, normal work of breathing Chest- ICD pocket is well healed Heart- Regular rate and rhythm, no murmurs, rubs or gallops, PMI not laterally displaced GI- soft, NT, ND, + BS Extremities- no clubbing, cyanosis, trace edema  ICD interrogation- reviewed in detail today,  See PACEART report  Assessment and Plan:  Chronic systolic congestive heart failure  Ms Philipps has an ICD previously  implanted for primary prevention of sudden death. She has stable NYHA Class III CHF. She appears euvolemic today.  Optivol is not elevated. No medicine changes.  Normal ICD function  See [facility] report   CAD (coronary artery disease)  No symptoms of ischemic  No changes to medicine today  Ischemic cardiomyopathy   As above  Hypertension  Salt restriction

## 2011-10-11 ENCOUNTER — Encounter: Payer: Medicaid Other | Admitting: Internal Medicine

## 2011-10-24 ENCOUNTER — Encounter: Payer: Self-pay | Admitting: Internal Medicine

## 2011-10-25 ENCOUNTER — Emergency Department (HOSPITAL_COMMUNITY)
Admission: EM | Admit: 2011-10-25 | Discharge: 2011-10-25 | Disposition: A | Discharge status: Home / Self Care | Payer: Medicaid Other | Attending: Emergency Medicine | Admitting: Emergency Medicine

## 2011-10-25 ENCOUNTER — Emergency Department (HOSPITAL_COMMUNITY): Payer: Medicaid Other

## 2011-10-25 ENCOUNTER — Encounter (HOSPITAL_COMMUNITY): Payer: Self-pay

## 2011-10-25 DIAGNOSIS — Z7982 Long term (current) use of aspirin: Secondary | ICD-10-CM | POA: Insufficient documentation

## 2011-10-25 DIAGNOSIS — K5289 Other specified noninfective gastroenteritis and colitis: Secondary | ICD-10-CM | POA: Insufficient documentation

## 2011-10-25 DIAGNOSIS — E119 Type 2 diabetes mellitus without complications: Secondary | ICD-10-CM | POA: Insufficient documentation

## 2011-10-25 DIAGNOSIS — K529 Noninfective gastroenteritis and colitis, unspecified: Secondary | ICD-10-CM

## 2011-10-25 DIAGNOSIS — I10 Essential (primary) hypertension: Secondary | ICD-10-CM | POA: Insufficient documentation

## 2011-10-25 DIAGNOSIS — I2589 Other forms of chronic ischemic heart disease: Secondary | ICD-10-CM | POA: Insufficient documentation

## 2011-10-25 DIAGNOSIS — Z951 Presence of aortocoronary bypass graft: Secondary | ICD-10-CM | POA: Insufficient documentation

## 2011-10-25 DIAGNOSIS — Z79899 Other long term (current) drug therapy: Secondary | ICD-10-CM | POA: Insufficient documentation

## 2011-10-25 DIAGNOSIS — E669 Obesity, unspecified: Secondary | ICD-10-CM | POA: Insufficient documentation

## 2011-10-25 DIAGNOSIS — G4730 Sleep apnea, unspecified: Secondary | ICD-10-CM | POA: Insufficient documentation

## 2011-10-25 DIAGNOSIS — R42 Dizziness and giddiness: Secondary | ICD-10-CM | POA: Insufficient documentation

## 2011-10-25 DIAGNOSIS — I2510 Atherosclerotic heart disease of native coronary artery without angina pectoris: Secondary | ICD-10-CM | POA: Insufficient documentation

## 2011-10-25 LAB — BASIC METABOLIC PANEL
BUN: 16 mg/dL (ref 6–23)
CO2: 31 mEq/L (ref 19–32)
Calcium: 10.1 mg/dL (ref 8.4–10.5)
Chloride: 100 mEq/L (ref 96–112)
Creatinine, Ser: 0.96 mg/dL (ref 0.50–1.10)
GFR calc Af Amer: 72 mL/min — ABNORMAL LOW (ref >90)

## 2011-10-25 LAB — CBC WITH DIFFERENTIAL/PLATELET
Basophils Absolute: 0 10*3/uL (ref 0.0–0.1)
Basophils Relative: 0 % (ref 0–1)
Eosinophils Relative: 1 % (ref 0–5)
HCT: 40.4 % (ref 36.0–46.0)
Hemoglobin: 13.3 g/dL (ref 12.0–15.0)
MCH: 30.4 pg (ref 26.0–34.0)
MCHC: 32.9 g/dL (ref 30.0–36.0)
MCV: 92.4 fL (ref 78.0–100.0)
Monocytes Absolute: 0.6 10*3/uL (ref 0.1–1.0)
Monocytes Relative: 9 % (ref 3–12)
RDW: 14.2 % (ref 11.5–15.5)

## 2011-10-25 LAB — GLUCOSE, CAPILLARY: Glucose-Capillary: 83 mg/dL (ref 70–99)

## 2011-10-25 LAB — TROPONIN I: Troponin I: <0.3 ng/mL (ref <0.30)

## 2011-10-25 LAB — PRO B NATRIURETIC PEPTIDE: Pro B Natriuretic peptide (BNP): 374.4 pg/mL — ABNORMAL HIGH (ref 0–125)

## 2011-10-25 MED ORDER — DIPHENOXYLATE-ATROPINE 2.5-0.025 MG PO TABS: 1.0000 | ORAL_TABLET | Freq: Four times a day (QID) | ORAL | Status: DC | PRN

## 2011-10-25 MED ORDER — METOCLOPRAMIDE HCL 5 MG/ML IJ SOLN
10.0000 mg | Freq: Once | INTRAMUSCULAR | Status: AC
  Administered 2011-10-25: 10 mg via INTRAVENOUS
  Filled 2011-10-25: qty 2

## 2011-10-25 MED ORDER — SODIUM CHLORIDE 0.9 % IV SOLN
Freq: Once | INTRAVENOUS | Status: AC
  Administered 2011-10-25: 07:00:00 via INTRAVENOUS

## 2011-10-25 MED ORDER — METOCLOPRAMIDE HCL 10 MG PO TABS: 10.0000 mg | ORAL_TABLET | Freq: Four times a day (QID) | ORAL | Status: DC

## 2011-10-25 NOTE — ED Notes (Signed)
Pt taken back to Room.  RN notified of concerns regarding pt behavior.  Pt weak and not responding well to questioning.

## 2011-10-25 NOTE — ED Provider Notes (Signed)
History     CSN: [id number]  Arrival date & time 10/25/11  0537   None     Chief Complaint  Patient presents with  . Diarrhea  . Weakness    (Consider location/radiation/quality/duration/timing/severity/associated sxs/prior treatment) Patient is a 61 y.o. female presenting with diarrhea and weakness. The history is provided by the patient and a relative. No language interpreter was used.  Diarrhea The primary symptoms include fatigue, nausea, vomiting and diarrhea. Primary symptoms do not include fever. The illness began today. The onset was sudden. The problem has not changed since onset. The illness does not include chills, bloating, constipation, back pain or itching. Associated medical issues do not include liver disease, gastric bypass, bowel resection, irritable bowel syndrome or diverticulitis.  Weakness The primary symptoms include nausea and vomiting. Primary symptoms do not include headaches or fever.  Additional symptoms include weakness.   61 year old female coming in with incontinence of stool in her sleep.  When she woke up she vomited x1. Patient says she is a little more short of breath than normal. She is on 2 L nasal cannula with sleep only. She has an extensive cardiac history. Patient denies any antibiotics in the last 3 months,abdomional pain, dysuria or fever. Patient was dizzy at the house but feels better now.  Patient is a Consulting civil engineer at [employer].  pmh listed below.    Past Medical History  Diagnosis Date  . Coronary artery disease     s/p CABG 2007  . Sleep apnea     uses O2 at night  . Ischemic cardiomyopathy     EF 30-35%, s/p ICD 4/08 by Dr Amil Amen  . Diabetes mellitus     type II  . Hypertension   . Obesity     Past Surgical History  Procedure Date  . Cardiac catheterization   . Cardiac defibrillator placement 4/08    by Dr Amil Amen (MDT)  . Coronary artery bypass graft 2007    Family History  Problem Relation Age of Onset  . Diabetes  Mother 47    died - HTN  . Other      No early family hx of CAD    History  Substance Use Topics  . Smoking status: Never Smoker   . Smokeless tobacco: Not on file  . Alcohol Use: No    OB History    Grav Para Term Preterm Abortions TAB SAB Ect Mult Living                  Review of Systems  Constitutional: Positive for fatigue. Negative for fever and chills.  HENT: Negative.   Eyes: Negative.   Respiratory: Positive for shortness of breath. Negative for cough.   Cardiovascular: Negative.  Negative for chest pain.  Gastrointestinal: Positive for nausea, vomiting and diarrhea. Negative for constipation and bloating.  Musculoskeletal: Negative for back pain.  Skin: Negative for itching.  Neurological: Positive for weakness and light-headedness. Negative for syncope, facial asymmetry, speech difficulty, numbness and headaches.  Psychiatric/Behavioral: Negative.   All other systems reviewed and are negative.    Allergies  Review of patient's allergies indicates no known allergies.  Home Medications   Current Outpatient Rx  Name Route Sig Dispense Refill  . ASPIRIN 325 MG PO TABS Oral Take 325 mg by mouth daily.      . ATORVASTATIN CALCIUM 20 MG PO TABS Oral Take 80 mg by mouth daily.     Marland Kitchen CARVEDILOL 12.5 MG PO TABS Oral  Take 25 mg by mouth 2 (two) times daily with a meal.     . FUROSEMIDE 80 MG PO TABS Oral Take 80 mg by mouth 2 (two) times daily.      Marland Kitchen HYDROCORTISONE 1 % EX CREA Topical Apply 1 application topically 2 (two) times daily as needed. For rash    . INSULIN GLARGINE 100 UNIT/ML [place] SOLN Subcutaneous Inject 90 Units into the skin at bedtime.     . INSULIN LISPRO (HUMAN) 100 UNIT/ML [place] SOLN Subcutaneous Inject 54 Units into the skin 3 (three) times daily after meals.     . ISOSORBIDE MONONITRATE ER 60 MG PO TB24 Oral Take 60 mg by mouth daily.    Marland Kitchen LISINOPRIL 5 MG PO TABS Oral Take 20 mg by mouth daily.     Marland Kitchen LIVING WELL WITH DIABETES BOOK Does not apply 1  each by Does not apply route once. 1 each 0  . NITROGLYCERIN 0.4 MG SL SUBL Sublingual Place 1 tablet (0.4 mg total) under the tongue every 5 (five) minutes x 3 doses as needed for chest pain. 30 tablet 12  . POTASSIUM CHLORIDE CRYS ER 20 MEQ PO TBCR Oral Take 20 mEq by mouth daily.      Marland Kitchen SPIRONOLACTONE 25 MG PO TABS Oral Take 25 mg by mouth daily.        BP 114/56  Pulse 63  Temp 98.9 F (37.2 C) (Oral)  Resp 24  SpO2 97%  Physical Exam  Nursing note and vitals reviewed. Constitutional: She is oriented to person, place, and time. She appears well-developed and well-nourished.  HENT:  Head: Normocephalic and atraumatic.  Eyes: Conjunctivae normal and EOM are normal. Pupils are equal, round, and reactive to light.  Neck: Normal range of motion. Neck supple.  Cardiovascular: Normal rate.   Pulmonary/Chest: Effort normal. She has decreased breath sounds in the left upper field and the left middle field.  Abdominal: Soft. She exhibits no distension. There is no tenderness.  Musculoskeletal: Normal range of motion. She exhibits no edema and no tenderness.  Neurological: She is alert and oriented to person, place, and time. She has normal reflexes.  Skin: Skin is warm and dry.  Psychiatric: She has a normal mood and affect.    ED Course  Procedures (including critical care time)   Labs Reviewed  GLUCOSE, CAPILLARY  CBC WITH DIFFERENTIAL  BASIC METABOLIC PANEL  TROPONIN I  PRO B NATRIURETIC PEPTIDE   No results found.   No diagnosis found.    MDM  61yo morbildy obese female with gastroenteritis today. Better after reglan and 150cc IV fluids in the ER today.  Rx for reglan and imodium.  Patient will follow up with pcp tomorrow as needed.  No fever, or dysuria. Tolerating po's prior to discharge.  Ambulating with no problems as well.  Work note provided.  Patient and family understand to return if worsening symptoms or high fever.     Labs Reviewed  BASIC METABOLIC PANEL -  Abnormal; Notable for the following:    Glucose, Bld 65 (*)     GFR calc non Af Amer 63 (*)     GFR calc Af Amer 72 (*)     All other components within normal limits  PRO B NATRIURETIC PEPTIDE - Abnormal; Notable for the following:    Pro B Natriuretic peptide (BNP) 374.4 (*)     All other components within normal limits  GLUCOSE, CAPILLARY - Abnormal; Notable for the following:  Glucose-Capillary 110 (*)     All other components within normal limits  CBC WITH DIFFERENTIAL  GLUCOSE, CAPILLARY  TROPONIN I     Date: 10/25/2011  Rate: 64  Rhythm: normal sinus rhythm  QRS Axis: normal  Intervals: normal  ST/T Wave abnormalities: normal  Conduction Disutrbances:left bundle branch block  Narrative Interpretation:   Old EKG Reviewed: unchanged        Remi Haggard, NP 10/25/11 [phone]

## 2011-10-25 NOTE — ED Notes (Signed)
Per pt, felt good yesterday. Woke up this am at around 3am with diarrhea x 2.Vomited x 1.  Nausea.  No abdominal pain.  Normal bm yesterday.  Pt is weak and light headed.

## 2011-10-26 NOTE — ED Provider Notes (Signed)
Medical screening examination/treatment/procedure(s) were performed by non-physician practitioner and as supervising physician I was immediately available for consultation/collaboration.   Jasmynn Pfalzgraf R Neriyah Cercone, MD 10/26/11 0445 

## 2011-12-27 ENCOUNTER — Emergency Department (HOSPITAL_COMMUNITY): Payer: Medicaid Other

## 2011-12-27 ENCOUNTER — Emergency Department (HOSPITAL_COMMUNITY)
Admission: EM | Admit: 2011-12-27 | Discharge: 2011-12-28 | Disposition: A | Discharge status: Home / Self Care | Payer: Medicaid Other | Attending: Emergency Medicine | Admitting: Emergency Medicine

## 2011-12-27 ENCOUNTER — Encounter (HOSPITAL_COMMUNITY): Payer: Self-pay | Admitting: *Deleted

## 2011-12-27 DIAGNOSIS — Z8679 Personal history of other diseases of the circulatory system: Secondary | ICD-10-CM | POA: Insufficient documentation

## 2011-12-27 DIAGNOSIS — I509 Heart failure, unspecified: Secondary | ICD-10-CM | POA: Insufficient documentation

## 2011-12-27 DIAGNOSIS — Z9981 Dependence on supplemental oxygen: Secondary | ICD-10-CM | POA: Insufficient documentation

## 2011-12-27 DIAGNOSIS — Z794 Long term (current) use of insulin: Secondary | ICD-10-CM | POA: Insufficient documentation

## 2011-12-27 DIAGNOSIS — Z79899 Other long term (current) drug therapy: Secondary | ICD-10-CM | POA: Insufficient documentation

## 2011-12-27 DIAGNOSIS — R55 Syncope and collapse: Secondary | ICD-10-CM | POA: Insufficient documentation

## 2011-12-27 DIAGNOSIS — I110 Hypertensive heart disease with heart failure: Secondary | ICD-10-CM | POA: Insufficient documentation

## 2011-12-27 DIAGNOSIS — Z7982 Long term (current) use of aspirin: Secondary | ICD-10-CM | POA: Insufficient documentation

## 2011-12-27 DIAGNOSIS — E119 Type 2 diabetes mellitus without complications: Secondary | ICD-10-CM | POA: Insufficient documentation

## 2011-12-27 DIAGNOSIS — I2510 Atherosclerotic heart disease of native coronary artery without angina pectoris: Secondary | ICD-10-CM | POA: Insufficient documentation

## 2011-12-27 HISTORY — DX: Dependence on supplemental oxygen: Z99.81

## 2011-12-27 LAB — CBC WITH DIFFERENTIAL/PLATELET
Basophils Absolute: 0 10*3/uL (ref 0.0–0.1)
Basophils Relative: 0 % (ref 0–1)
Eosinophils Absolute: 0.1 10*3/uL (ref 0.0–0.7)
Eosinophils Relative: 1 % (ref 0–5)
HCT: 43.6 % (ref 36.0–46.0)
Hemoglobin: 14.3 g/dL (ref 12.0–15.0)
Lymphocytes Relative: 20 % (ref 12–46)
Lymphs Abs: 1.5 10*3/uL (ref 0.7–4.0)
MCH: 30 pg (ref 26.0–34.0)
MCHC: 32.8 g/dL (ref 30.0–36.0)
MCV: 91.6 fL (ref 78.0–100.0)
Monocytes Absolute: 0.6 10*3/uL (ref 0.1–1.0)
Monocytes Relative: 8 % (ref 3–12)
Neutro Abs: 5.4 10*3/uL (ref 1.7–7.7)
Neutrophils Relative %: 70 % (ref 43–77)
Platelets: 189 10*3/uL (ref 150–400)
RBC: 4.76 MIL/uL (ref 3.87–5.11)
RDW: 13.8 % (ref 11.5–15.5)
WBC: 7.6 10*3/uL (ref 4.0–10.5)

## 2011-12-27 LAB — COMPREHENSIVE METABOLIC PANEL
ALT: 15 U/L (ref 0–35)
AST: 18 U/L (ref 0–37)
Albumin: 3.4 g/dL — ABNORMAL LOW (ref 3.5–5.2)
Alkaline Phosphatase: 98 U/L (ref 39–117)
BUN: 15 mg/dL (ref 6–23)
CO2: 30 mEq/L (ref 19–32)
Calcium: 9.6 mg/dL (ref 8.4–10.5)
Chloride: 100 mEq/L (ref 96–112)
Creatinine, Ser: 1.28 mg/dL — ABNORMAL HIGH (ref 0.50–1.10)
GFR calc Af Amer: 51 mL/min — ABNORMAL LOW (ref >90)
GFR calc non Af Amer: 44 mL/min — ABNORMAL LOW (ref >90)
Glucose, Bld: 284 mg/dL — ABNORMAL HIGH (ref 70–99)
Potassium: 4.4 mEq/L (ref 3.5–5.1)
Sodium: 139 mEq/L (ref 135–145)
Total Bilirubin: 0.4 mg/dL (ref 0.3–1.2)
Total Protein: 7.5 g/dL (ref 6.0–8.3)

## 2011-12-27 MED ORDER — SODIUM CHLORIDE 0.9 % IV BOLUS (SEPSIS)
500.0000 mL | INTRAVENOUS | Status: AC
  Administered 2011-12-28: 500 mL via INTRAVENOUS

## 2011-12-27 NOTE — ED Notes (Signed)
Pt stated that she didn't feel comfortable standing, stated she felt very weak.

## 2011-12-27 NOTE — ED Notes (Signed)
Patient still has not urinated. Discussed with her if she is unable to urinate that we will do an I/O cath. Patient stated that she will not consent to I/O. She said she will urinate at 2300 because she always uses the bathroom at that time. MD informed.

## 2011-12-27 NOTE — ED Notes (Signed)
Patient was on the toilet at a salon when she became nauseated and had a near syncopal episode while still on the toilet. Patient states she never passed out.

## 2011-12-28 LAB — URINE MICROSCOPIC-ADD ON

## 2011-12-28 LAB — URINALYSIS, ROUTINE W REFLEX MICROSCOPIC
Glucose, UA: NEGATIVE mg/dL
Hgb urine dipstick: NEGATIVE
Ketones, ur: 15 mg/dL — AB
Nitrite: NEGATIVE
Protein, ur: 30 mg/dL — AB
Specific Gravity, Urine: 1.026 (ref 1.005–1.030)
Urobilinogen, UA: 1 mg/dL (ref 0.0–1.0)
pH: 5 (ref 5.0–8.0)

## 2011-12-28 LAB — GLUCOSE, CAPILLARY

## 2011-12-28 NOTE — ED Notes (Signed)
Advised patient we need a urine sample. 

## 2011-12-28 NOTE — ED Provider Notes (Signed)
History     CSN: [id number]  Arrival date & time 12/27/11  1801   First MD Initiated Contact with Patient 12/27/11 1811      Chief Complaint  Patient presents with  . Near Syncope    (Consider location/radiation/quality/duration/timing/severity/associated sxs/prior treatment) HPI Patient presents emergency department with a two-day history of intermittent lightheadedness, and dizziness.  Patient, states that she thought she may have close to passing out on a few occasions.  Patient denies chest pain, shortness of breath,weakness, headache, blurred vision, abdominal pain, cough, fever, or syncope.  Patient, states that she did not take anything prior to arrival, for her symptoms.  Patient, states that standing seems to make her symptoms worse.  Patient said she notes 4 episodes of vomiting. Past Medical History  Diagnosis Date  . Coronary artery disease     s/p CABG 2007  . Sleep apnea     uses O2 at night  . Ischemic cardiomyopathy     EF 30-35%, s/p ICD 4/08 by Dr Amil Amen  . Diabetes mellitus     type II  . Hypertension   . Obesity   . CHF (congestive heart failure)   . Oxygen dependent 2 L    Past Surgical History  Procedure Date  . Cardiac catheterization   . Cardiac defibrillator placement 4/08    by Dr Amil Amen (MDT)  . Coronary artery bypass graft 2007    Family History  Problem Relation Age of Onset  . Diabetes Mother 28    died - HTN  . Other      No early family hx of CAD    History  Substance Use Topics  . Smoking status: Never Smoker   . Smokeless tobacco: Not on file  . Alcohol Use: No    OB History    Grav Para Term Preterm Abortions TAB SAB Ect Mult Living                  Review of Systems All other systems negative except as documented in the HPI. All pertinent positives and negatives as reviewed in the HPI.  Allergies  Metformin and related  Home Medications   Current Outpatient Rx  Name  Route  Sig  Dispense  Refill  . ASPIRIN  325 MG PO TABS   Oral   Take 325 mg by mouth daily.           . ATORVASTATIN CALCIUM 20 MG PO TABS   Oral   Take 80 mg by mouth daily.          Marland Kitchen CARVEDILOL 12.5 MG PO TABS   Oral   Take 25 mg by mouth 2 (two) times daily with a meal.          . DIPHENOXYLATE-ATROPINE 2.5-0.025 MG PO TABS   Oral   Take 1 tablet by mouth 4 (four) times daily as needed for diarrhea or loose stools.   30 tablet   0   . FERROUS SULFATE 325 (65 FE) MG PO TABS   Oral   Take 325 mg by mouth daily with breakfast.         . FUROSEMIDE 80 MG PO TABS   Oral   Take 80 mg by mouth 2 (two) times daily.           Marland Kitchen HYDROCORTISONE 1 % EX CREA   Topical   Apply 1 application topically 2 (two) times daily as needed. For rash         .  INSULIN GLARGINE 100 UNIT/ML [place] SOLN   Subcutaneous   Inject 90 Units into the skin at bedtime.          . INSULIN LISPRO (HUMAN) 100 UNIT/ML [place] SOLN   Subcutaneous   Inject 54 Units into the skin 3 (three) times daily after meals.          . ISOSORBIDE MONONITRATE ER 60 MG PO TB24   Oral   Take 60 mg by mouth daily.         Marland Kitchen LISINOPRIL 5 MG PO TABS   Oral   Take 20 mg by mouth daily.          Marland Kitchen METOCLOPRAMIDE HCL 10 MG PO TABS   Oral   Take 1 tablet (10 mg total) by mouth every 6 (six) hours.   30 tablet   0   . NITROGLYCERIN 0.4 MG SL SUBL   Sublingual   Place 1 tablet (0.4 mg total) under the tongue every 5 (five) minutes x 3 doses as needed for chest pain.   30 tablet   12   . POTASSIUM CHLORIDE CRYS ER 20 MEQ PO TBCR   Oral   Take 20 mEq by mouth daily.           Marland Kitchen SPIRONOLACTONE 25 MG PO TABS   Oral   Take 25 mg by mouth daily.           Marland Kitchen LIVING WELL WITH DIABETES BOOK   Does not apply   1 each by Does not apply route once.   1 each   0     BP 105/53  Pulse 62  Temp 97.9 F (36.6 C) (Oral)  Resp 24  SpO2 97%  Physical Exam  Constitutional: She is oriented to person, place, and time. She appears  well-developed and well-nourished. No distress.  HENT:  Head: Normocephalic and atraumatic.  Mouth/Throat: Oropharynx is clear and moist.  Eyes: EOM are normal. Pupils are equal, round, and reactive to light.  Neck: Normal range of motion. Neck supple.  Cardiovascular: Normal rate, regular rhythm and normal heart sounds.   Pulmonary/Chest: Effort normal and breath sounds normal.  Abdominal: Soft. Bowel sounds are normal. She exhibits no distension. There is no tenderness.  Neurological: She is alert and oriented to person, place, and time. She has normal strength. No sensory deficit. She displays a negative Romberg sign. Coordination and gait normal. GCS eye subscore is 4. GCS verbal subscore is 5. GCS motor subscore is 6.    ED Course  Procedures (including critical care time)  Labs Reviewed  GLUCOSE, CAPILLARY - Abnormal; Notable for the following:    Glucose-Capillary 273 (*)     All other components within normal limits  COMPREHENSIVE METABOLIC PANEL - Abnormal; Notable for the following:    Glucose, Bld 284 (*)     Creatinine, Ser 1.28 (*)     Albumin 3.4 (*)     GFR calc non Af Amer 44 (*)     GFR calc Af Amer 51 (*)     All other components within normal limits  CBC WITH DIFFERENTIAL  URINALYSIS, ROUTINE W REFLEX MICROSCOPIC   Dg Chest 2 View  12/27/2011  *RADIOLOGY REPORT*  Clinical Data: Cough, chest pain  CHEST - 2 VIEW  Comparison: 10/25/2011  Findings: Cardiomegaly with pulmonary vascular congestion.  No frank interstitial edema.  No pleural effusion or pneumothorax.  Postsurgical changes related to prior CABG.  Left subclavian ICD.  Mild degenerative changes  of the visualized thoracolumbar spine.  IMPRESSION: Cardiomegaly with pulmonary vascular congestion.  No frank interstitial edema.   Original Report Authenticated By: Charline Bills, M.D.       Patient has been unable to produce a urine for [name] and that has been a significant delay in her care she refuses in and  out catheterization.  Feel like the patient's elevated blood sugar, along with some dehydration has been contributing to her feelings of lightheadedness Patient, is awaiting urinalysis at this time.  Patient was undergoing to home with followup with her primary care Dr. MDM  MDM Reviewed: nursing note, vitals and previous chart Reviewed previous: labs Interpretation: labs, ECG and x-ray     Date: 12/28/2011  Rate: 64  Rhythm: normal sinus rhythm  QRS Axis: normal  Intervals: normal  ST/T Wave abnormalities: normal  Conduction Disutrbances:left bundle branch block  Narrative Interpretation:   Old EKG Reviewed: unchanged             Carlyle Dolly, PA-C 12/28/11 0058  Carlyle Dolly, PA-C 12/28/11 0230

## 2012-01-05 NOTE — ED Provider Notes (Signed)
History/physical exam/procedure(s) were performed by non-physician practitioner and as supervising physician I was immediately available for consultation/collaboration. I have reviewed all notes and am in agreement with care and plan.   Hilario Quarry, MD 01/05/12 [phone]

## 2012-01-15 ENCOUNTER — Encounter: Payer: Self-pay | Admitting: Cardiology

## 2012-01-15 ENCOUNTER — Encounter: Payer: Self-pay | Admitting: Internal Medicine

## 2012-01-15 ENCOUNTER — Ambulatory Visit (INDEPENDENT_AMBULATORY_CARE_PROVIDER_SITE_OTHER): Payer: Medicaid Other | Admitting: Cardiology

## 2012-01-15 VITALS — BP 124/72 | HR 56 | Ht 65.0 in | Wt 257.0 lb

## 2012-01-15 DIAGNOSIS — I509 Heart failure, unspecified: Secondary | ICD-10-CM

## 2012-01-15 DIAGNOSIS — I2589 Other forms of chronic ischemic heart disease: Secondary | ICD-10-CM

## 2012-01-15 DIAGNOSIS — I5022 Chronic systolic (congestive) heart failure: Secondary | ICD-10-CM

## 2012-01-15 DIAGNOSIS — Z95810 Presence of automatic (implantable) cardiac defibrillator: Secondary | ICD-10-CM

## 2012-01-15 DIAGNOSIS — I255 Ischemic cardiomyopathy: Secondary | ICD-10-CM

## 2012-01-15 LAB — ICD DEVICE OBSERVATION
AL AMPLITUDE: 1.5 mv
ATRIAL PACING ICD: 0.09 pct
BAMS-0001: 170 {beats}/min
DEV-0020ICD: NEGATIVE
PACEART VT: 0
RV LEAD AMPLITUDE: 11.4 mv
RV LEAD IMPEDENCE ICD: 456 Ohm
RV LEAD THRESHOLD: 1 V
TZAT-0001ATACH: 2
TZAT-0001ATACH: 3
TZAT-0001FASTVT: 1
TZAT-0001SLOWVT: 1
TZAT-0001SLOWVT: 2
TZAT-0004SLOWVT: 8
TZAT-0004SLOWVT: 8
TZAT-0012ATACH: 150 ms
TZAT-0012ATACH: 150 ms
TZAT-0012FASTVT: 200 ms
TZAT-0012SLOWVT: 200 ms
TZAT-0018FASTVT: NEGATIVE
TZAT-0019ATACH: 6 V
TZAT-0019SLOWVT: 8 V
TZAT-0019SLOWVT: 8 V
TZAT-0020ATACH: 1.5 ms
TZAT-0020ATACH: 1.5 ms
TZAT-0020SLOWVT: 1.5 ms
TZAT-0020SLOWVT: 1.5 ms
TZON-0003SLOWVT: 350 ms
TZON-0003VSLOWVT: 400 ms
TZON-0004VSLOWVT: 20
TZST-0001ATACH: 6
TZST-0001FASTVT: 2
TZST-0001FASTVT: 4
TZST-0001FASTVT: 5
TZST-0001FASTVT: 6
TZST-0001SLOWVT: 3
TZST-0001SLOWVT: 5
TZST-0002ATACH: NEGATIVE
TZST-0002ATACH: NEGATIVE
TZST-0002FASTVT: NEGATIVE
TZST-0002FASTVT: NEGATIVE
TZST-0003SLOWVT: 35 J
TZST-0003SLOWVT: 35 J

## 2012-01-15 NOTE — Patient Instructions (Signed)
Your physician recommends that you schedule a follow-up appointment in: 3 months with [facility]     

## 2012-01-15 NOTE — Progress Notes (Signed)
ELECTROPHYSIOLOGY OFFICE NOTE  Patient ID: IVAH Tran MRN: [id number], DOB/AGE: 62-Jul-1952   Date of Visit: 01/15/2012  Primary Physician: Talmage Coin, MD Primary Cardiologist: Donato Schultz, MD Primary EP: Hillis Range, MD Reason for Visit: EP/device follow-up  History of Present Illness  [name] is a 62 year old woman with an ischemic CM, EF 30-35% s/p dual chamber ICD implant in 2008 for primary prevention who presents today for routine electrophysiology followup. Since last being seen in our clinic, she reports she is stable from a cardiac standpoint. She reports she has had a recent cold but feels her sinus congestion and cough are improving. She denies fever. Today, she denies symptoms of palpitations, chest pain, orthopnea, PND, lower extremity swelling, dizziness, near syncope or syncope. She has chronic DOE but reports this has been stable. However, her daughter expresses concern that she may have asthma as she has noticed intermittent wheezing in addition to her mother's cold symptoms. [name] has used her daughter's inhaler at times which helps. She has been evaluated by her PCP in the last 1-2 weeks for these symptoms. Her daughter reports they will schedule follow-up to discuss with her PCP further. She is otherwise without complaint today.   Past Medical History Past Medical History  Diagnosis Date  . Coronary artery disease     s/p CABG 2007  . Sleep apnea     uses O2 at night  . Ischemic cardiomyopathy     EF 30-35%, s/p ICD 4/08 by Dr Amil Amen  . Diabetes mellitus     type II  . Hypertension   . Obesity   . CHF (congestive heart failure)   . Oxygen dependent 2 L    Past Surgial History Past Surgical History  Procedure Date  . Cardiac catheterization   . Cardiac defibrillator placement 4/08    by Dr Amil Amen (MDT)  . Coronary artery bypass graft 2007     Allergies/Intolerances Allergies  Allergen Reactions  . Metformin And Related Other (See Comments)      Leg pain    Current Home Medications Current Outpatient Prescriptions  Medication Sig Dispense Refill  . aspirin 325 MG tablet Take 325 mg by mouth daily.        Marland Kitchen atorvastatin (LIPITOR) 20 MG tablet Take 80 mg by mouth daily.       . carvedilol (COREG) 12.5 MG tablet Take 25 mg by mouth 2 (two) times daily with a meal.       . diphenoxylate-atropine (LOMOTIL) 2.5-0.025 MG per tablet Take 1 tablet by mouth 4 (four) times daily as needed for diarrhea or loose stools.  30 tablet  0  . ferrous sulfate 325 (65 FE) MG tablet Take 325 mg by mouth daily with breakfast.      . furosemide (LASIX) 80 MG tablet Take 80 mg by mouth 2 (two) times daily.        . hydrocortisone cream 1 % Apply 1 application topically 2 (two) times daily as needed. For rash      . insulin glargine (LANTUS) 100 UNIT/ML injection Inject 90 Units into the skin at bedtime.       . insulin lispro (HUMALOG) 100 UNIT/ML injection Inject 54 Units into the skin 3 (three) times daily after meals.       . isosorbide mononitrate (IMDUR) 60 MG 24 hr tablet Take 60 mg by mouth daily.      Marland Kitchen lisinopril (PRINIVIL,ZESTRIL) 5 MG tablet Take 20 mg by mouth daily.       Marland Kitchen  living well with diabetes book MISC 1 each by Does not apply route once.  1 each  0  . nitroGLYCERIN (NITROSTAT) 0.4 MG SL tablet Place 1 tablet (0.4 mg total) under the tongue every 5 (five) minutes x 3 doses as needed for chest pain.  30 tablet  12  . potassium chloride SA (K-DUR,KLOR-CON) 20 MEQ tablet Take 20 mEq by mouth daily.        Marland Kitchen spironolactone (ALDACTONE) 25 MG tablet Take 25 mg by mouth daily.          Social History Social History  . Marital Status: Married   Social History Main Topics  . Smoking status: Never Smoker   . Smokeless tobacco: Not on file  . Alcohol Use: No  . Drug Use: No   Social History Narrative   Disabled Producer, television/film/video.  Currently taking sociology classes at [employer].    Review of Systems General: No chills, fever, night sweats or  weight changes Cardiovascular: No chest pain, edema, worsening DOE, orthopnea, palpitations, paroxysmal nocturnal dyspnea Dermatological: No rash, lesions or masses Respiratory: Recent cough and nasal congestion Urologic: No hematuria, dysuria Abdominal: No nausea, vomiting, diarrhea, bright red blood per rectum, melena, or hematemesis Neurologic: No visual changes, weakness, changes in mental status All other systems reviewed and are otherwise negative except as noted above.  Physical Exam Blood pressure 124/72, pulse 56, height 5\' 5"  (1.651 m), weight 257 lb (116.574 kg).  General: Well developed, well appearing 62 year old female in no acute distress. HEENT: Normocephalic, atraumatic. EOMs intact. Sclera nonicteric. Oropharynx clear.  Neck: Supple. No JVD. Lungs: Respirations regular and unlabored, CTA bilaterally. No wheezes, rales or rhonchi. Heart: Soft/distant heart tones. RRR. S1, S2 present. No murmurs, rub, S3 or S4. Abdomen: Soft, non-distended.  Extremities: No clubbing, cyanosis or edema. DP/PT/Radials 2+ and equal bilaterally. Psych: Normal affect. Neuro: Alert and oriented X 3. Moves all extremities spontaneously.   Diagnostics Device interrogation performed by me in clinic shows normal dual chamber ICD function with stable lead measurements/parameters; 1 NSVT episode - 6 beats in duration otherwise no VT/VF episodes; histograms appropriate; no programming changes made; see PaceArt report  Assessment and Plan 1. Ischemic CM, EF 30-35%, s/p ICD implant Normal device function No programming changes made Return for follow-up in device clinic in 3 months 2. Chronic systolic HF Stable; euvolemic by exam OptiVol is not elevated Continue medical therapy  3. CAD Stable without anginal symptoms Continue medical therapy and routine follow-up with Dr. Anne Fu   Signed, Toribio Seiber, PA-C 01/15/2012, 12:26 PM

## 2012-01-30 ENCOUNTER — Inpatient Hospital Stay (HOSPITAL_COMMUNITY)
Admission: EM | Admit: 2012-01-30 | Discharge: 2012-02-01 | DRG: 683 | Disposition: A | Discharge status: Home / Self Care | Payer: Medicaid Other | Attending: Internal Medicine | Admitting: Internal Medicine

## 2012-01-30 ENCOUNTER — Encounter (HOSPITAL_COMMUNITY): Payer: Self-pay | Admitting: Emergency Medicine

## 2012-01-30 ENCOUNTER — Emergency Department (HOSPITAL_COMMUNITY): Payer: Medicaid Other

## 2012-01-30 DIAGNOSIS — E860 Dehydration: Secondary | ICD-10-CM | POA: Diagnosis present

## 2012-01-30 DIAGNOSIS — Z9981 Dependence on supplemental oxygen: Secondary | ICD-10-CM

## 2012-01-30 DIAGNOSIS — E669 Obesity, unspecified: Secondary | ICD-10-CM | POA: Diagnosis present

## 2012-01-30 DIAGNOSIS — I2589 Other forms of chronic ischemic heart disease: Secondary | ICD-10-CM | POA: Diagnosis present

## 2012-01-30 DIAGNOSIS — N179 Acute kidney failure, unspecified: Secondary | ICD-10-CM

## 2012-01-30 DIAGNOSIS — I509 Heart failure, unspecified: Secondary | ICD-10-CM | POA: Diagnosis present

## 2012-01-30 DIAGNOSIS — I9589 Other hypotension: Secondary | ICD-10-CM | POA: Diagnosis present

## 2012-01-30 DIAGNOSIS — Z6841 Body Mass Index (BMI) 40.0 and over, adult: Secondary | ICD-10-CM

## 2012-01-30 DIAGNOSIS — I959 Hypotension, unspecified: Secondary | ICD-10-CM | POA: Diagnosis present

## 2012-01-30 DIAGNOSIS — Z79899 Other long term (current) drug therapy: Secondary | ICD-10-CM

## 2012-01-30 DIAGNOSIS — Z951 Presence of aortocoronary bypass graft: Secondary | ICD-10-CM

## 2012-01-30 DIAGNOSIS — I10 Essential (primary) hypertension: Secondary | ICD-10-CM | POA: Diagnosis present

## 2012-01-30 DIAGNOSIS — I5022 Chronic systolic (congestive) heart failure: Secondary | ICD-10-CM | POA: Diagnosis present

## 2012-01-30 DIAGNOSIS — I2510 Atherosclerotic heart disease of native coronary artery without angina pectoris: Secondary | ICD-10-CM | POA: Diagnosis present

## 2012-01-30 DIAGNOSIS — E785 Hyperlipidemia, unspecified: Secondary | ICD-10-CM | POA: Diagnosis present

## 2012-01-30 DIAGNOSIS — Z888 Allergy status to other drugs, medicaments and biological substances status: Secondary | ICD-10-CM

## 2012-01-30 DIAGNOSIS — E119 Type 2 diabetes mellitus without complications: Secondary | ICD-10-CM | POA: Diagnosis present

## 2012-01-30 DIAGNOSIS — Z95810 Presence of automatic (implantable) cardiac defibrillator: Secondary | ICD-10-CM

## 2012-01-30 DIAGNOSIS — R531 Weakness: Secondary | ICD-10-CM | POA: Diagnosis present

## 2012-01-30 DIAGNOSIS — Z23 Encounter for immunization: Secondary | ICD-10-CM

## 2012-01-30 DIAGNOSIS — G4733 Obstructive sleep apnea (adult) (pediatric): Secondary | ICD-10-CM | POA: Diagnosis present

## 2012-01-30 LAB — COMPREHENSIVE METABOLIC PANEL
ALT: 15 U/L (ref 0–35)
Alkaline Phosphatase: 95 U/L (ref 39–117)
CO2: 27 mEq/L (ref 19–32)
GFR calc Af Amer: 36 mL/min — ABNORMAL LOW (ref >90)
Glucose, Bld: 77 mg/dL (ref 70–99)
Potassium: 4.3 mEq/L (ref 3.5–5.1)
Sodium: 138 mEq/L (ref 135–145)
Total Protein: 7.5 g/dL (ref 6.0–8.3)

## 2012-01-30 LAB — CBC WITH DIFFERENTIAL/PLATELET
Eosinophils Absolute: 0.2 10*3/uL (ref 0.0–0.7)
Lymphocytes Relative: 41 % (ref 12–46)
Lymphs Abs: 2.6 10*3/uL (ref 0.7–4.0)
Neutro Abs: 3 10*3/uL (ref 1.7–7.7)
Neutrophils Relative %: 47 % (ref 43–77)
Platelets: 187 10*3/uL (ref 150–400)
RBC: 4.78 MIL/uL (ref 3.87–5.11)
WBC: 6.3 10*3/uL (ref 4.0–10.5)

## 2012-01-30 LAB — POCT I-STAT TROPONIN I: Troponin i, poc: 0.02 ng/mL (ref 0.00–0.08)

## 2012-01-30 NOTE — ED Notes (Signed)
Patient reports hyperglycemia and intermittent light-headedness since this morning.  Patient reports last CBG reading 551 around 2230.  Reports taking diabetic medication as prescribed.  CBG reading in triage 78.

## 2012-01-30 NOTE — ED Provider Notes (Signed)
62 year old female who has a history of diabetes, hypertension, congestive heart failure, last echocardiogram showed an ejection fraction of 25-30% who presents with a complaint of lightheadedness and hyperglycemia. The patient states that she woke up lightheaded this morning feeling like she was going to pass out. Feeling like she was going to fall over when she stood up. This has been persistent throughout the day, seems to get worse when she stands, is not associated with increased shortness of breath, chest pain, swelling in the legs, dysuria or diarrhea. She admits to having hyperglycemia on her home glucose monitor as high as 500 just prior to arrival however on arrival her hospital measured blood glucose is less than 100.  Physical exam shows a lady who is mildly tachypneic, morbidly obese with a generally soft abdomen with no guarding or peritoneal signs, clear lung sounds without any rales, mild tachypnea, distant heart sounds with strong palpable pulses at the radial arteries and no peripheral edema. Neurologically the patient has been able to move all extremities x4 with normal strength, normal coordination, no pronator drift, clear speech, normal pupillary exam, cranial nerves III through XII intact grossly. The patient is alert and oriented.  Assessment the patient is generally lightheaded but has a vague history per her report. Would need to evaluate for cardiac etiology, diabetic obligations, infectious etiology, congestive heart failure. EKG, labs, chest x-ray.  ED ECG REPORT  I personally interpreted this EKG   Date: 01/31/2012   Rate: 62  Rhythm: normal sinus rhythm  QRS Axis: normal  Intervals: normal  ST/T Wave abnormalities: nonspecific ST/T changes  Conduction Disutrbances:nonspecific intraventricular conduction delay  Narrative Interpretation:   Old EKG Reviewed: unchanged and similar to 12/27/11  The patient became hypotensive with standing, has had an increase in her BUN and  her creatinine consistent with acute renal failure, dehydration. Urinalysis does not show any signs of infection. IV fluids have been given, there has been a mild to moderate increase in her blood pressure and her symptoms. She has been given D50 half amp and has done well. I discussed her care with the hospitalist who will admit for further evaluation and gentle hydration.   Medical screening examination/treatment/procedure(s) were conducted as a shared visit with non-physician practitioner(s) and myself.  I personally evaluated the patient during the encounter    Vida Roller, MD 01/31/12 [phone]

## 2012-01-30 NOTE — ED Provider Notes (Signed)
History     CSN: [id number]  Arrival date & time 01/30/12  2246   First MD Initiated Contact with Patient 01/30/12 2303      Chief Complaint  Patient presents with  . Hyperglycemia  . Weakness    (Consider location/radiation/quality/duration/timing/severity/associated sxs/prior treatment) Patient is a 62 y.o. female presenting with weakness. The history is provided by the patient. No language interpreter was used.  Weakness The primary symptoms include visual change. Primary symptoms do not include headaches, syncope, loss of consciousness, dizziness, speech change, fever, nausea or vomiting. The symptoms began 12 to 24 hours ago. Episode duration: since this morning. Progression since onset: gradually worsening.  Additional symptoms include weakness. Additional symptoms do not include pain. Medical issues also include diabetes and hypertension.    Past Medical History  Diagnosis Date  . Coronary artery disease     s/p CABG 2007  . Sleep apnea     uses O2 at night  . Ischemic cardiomyopathy     EF 30-35%, s/p ICD 4/08 by Dr Amil Amen  . Diabetes mellitus     type II  . Hypertension   . Obesity   . CHF (congestive heart failure)   . Oxygen dependent 2 L    Past Surgical History  Procedure Date  . Cardiac catheterization   . Cardiac defibrillator placement 4/08    by Dr Amil Amen (MDT)  . Coronary artery bypass graft 2007    Family History  Problem Relation Age of Onset  . Diabetes Mother 52    died - HTN  . Other      No early family hx of CAD    History  Substance Use Topics  . Smoking status: Never Smoker   . Smokeless tobacco: Not on file  . Alcohol Use: No    OB History    Grav Para Term Preterm Abortions TAB SAB Ect Mult Living                  Review of Systems  Constitutional: Negative for fever, chills and diaphoresis.  Eyes: Positive for visual disturbance.       Patient admits to an isolated 5 min episode of "spots in her vision" this morning   Respiratory: Negative for chest tightness and shortness of breath.   Cardiovascular: Negative for chest pain, leg swelling and syncope.  Gastrointestinal: Negative for nausea, vomiting and abdominal pain.  Genitourinary: Negative for urgency and frequency.  Neurological: Positive for weakness and light-headedness. Negative for dizziness, speech change, loss of consciousness, syncope and headaches.    Allergies  Metformin and related  Home Medications   Current Outpatient Rx  Name  Route  Sig  Dispense  Refill  . ASPIRIN 325 MG PO TABS   Oral   Take 325 mg by mouth daily.           . ATORVASTATIN CALCIUM 20 MG PO TABS   Oral   Take 80 mg by mouth daily.          Marland Kitchen CARVEDILOL 12.5 MG PO TABS   Oral   Take 25 mg by mouth 2 (two) times daily with a meal.          . FERROUS SULFATE 325 (65 FE) MG PO TABS   Oral   Take 325 mg by mouth daily with breakfast.         . FUROSEMIDE 80 MG PO TABS   Oral   Take 80 mg by mouth 2 (two) times daily.           Marland Kitchen  INSULIN GLARGINE 100 UNIT/ML [place] SOLN   Subcutaneous   Inject 90 Units into the skin at bedtime.          . INSULIN LISPRO (HUMAN) 100 UNIT/ML  SOLN   Subcutaneous   Inject 54 Units into the skin 3 (three) times daily after meals.          . ISOSORBIDE MONONITRATE ER 60 MG PO TB24   Oral   Take 60 mg by mouth daily.         Marland Kitchen LISINOPRIL 5 MG PO TABS   Oral   Take 20 mg by mouth daily.          Marland Kitchen POTASSIUM CHLORIDE CRYS ER 20 MEQ PO TBCR   Oral   Take 20 mEq by mouth daily.           Marland Kitchen SPIRONOLACTONE 25 MG PO TABS   Oral   Take 25 mg by mouth daily.           Marland Kitchen NITROGLYCERIN 0.4 MG SL SUBL   Sublingual   Place 1 tablet (0.4 mg total) under the tongue every 5 (five) minutes x 3 doses as needed for chest pain.   30 tablet   12     BP 102/47  Pulse 56  Temp 97.4 F (36.3 C) (Oral)  Resp 14  SpO2 97%  Physical Exam  Constitutional:       Morbidly obese  HENT:  Head: Normocephalic  and atraumatic.  Eyes: Conjunctivae normal are normal. Pupils are equal, round, and reactive to light.  Cardiovascular: Normal rate and regular rhythm.        Very distant heart sounds; palpable radial pulses b/l  Pulmonary/Chest: Breath sounds normal.       Increased work of breathing  Abdominal: Bowel sounds are normal. She exhibits no distension. There is no tenderness. There is no rebound and no guarding.  Musculoskeletal: Normal range of motion. She exhibits no edema.  Neurological: She is alert.  Skin: Skin is warm and dry.  Psychiatric: She has a normal mood and affect. Her behavior is normal.    ED Course  Procedures (including critical care time)  Labs Reviewed  COMPREHENSIVE METABOLIC PANEL - Abnormal; Notable for the following:    Creatinine, Ser 1.71 (*)     Albumin 3.2 (*)     GFR calc non Af Amer 31 (*)     GFR calc Af Amer 36 (*)     All other components within normal limits  URINALYSIS, ROUTINE W REFLEX MICROSCOPIC - Abnormal; Notable for the following:    Color, Urine AMBER (*)  BIOCHEMICALS MAY BE AFFECTED BY COLOR   APPearance CLOUDY (*)     Bilirubin Urine MODERATE (*)     Ketones, ur 15 (*)     Protein, ur 30 (*)     Leukocytes, UA TRACE (*)     All other components within normal limits  PRO B NATRIURETIC PEPTIDE - Abnormal; Notable for the following:    Pro B Natriuretic peptide (BNP) 893.8 (*)     All other components within normal limits  URINE MICROSCOPIC-ADD ON - Abnormal; Notable for the following:    Squamous Epithelial / LPF FEW (*)     Bacteria, UA FEW (*)     Casts HYALINE CASTS (*)     Crystals CA OXALATE CRYSTALS (*)     All other components within normal limits  CBC WITH DIFFERENTIAL  GLUCOSE, CAPILLARY  GLUCOSE, CAPILLARY  POCT  I-STAT TROPONIN I   Dg Chest 2 View  01/31/2012  *RADIOLOGY REPORT*  Clinical Data: Shortness of breath and weakness.  CHEST - 2 VIEW  Comparison: 12/27/2011  Findings: Two views of the chest demonstrates a left  cardiac ICD. Again noted is enlargement of the cardiac silhouette and status post median sternotomy. Prominent interstitial lung markings are concerning for mild edema.  No definite pleural effusions.  IMPRESSION: Cardiomegaly and concern for mild edema.   Original Report Authenticated By: Richarda Overlie, M.D.      1. Renal failure, acute   2. Dehydration      Date: 01/31/2012  Rate: 62  Rhythm: normal sinus rhythm  QRS Axis: normal  Intervals: normal  ST/T Wave abnormalities: Nonspecific ST; T wave inversions seen in I and aVL as in previous EKG  Conduction Disutrbances:left bundle branch block  Narrative Interpretation:   Old EKG Reviewed: unchanged    MDM  Patient present c/o lightheadedness and generalized weakness since this morning that has been gradually worsening. Patient admits to taking blood sugar multiple times today; patient took CBG level at home which read in the 500's which prompted her visit to the ED. CBG on arrival was ~80. Patient denies fever, N/V/D, chest pain, SOB, urinary frequency or urgency, and lower extremity edema. She admits to being fully compliant with her diabetes medication. Patient is visibly tachypneic on exam. Overall physical exam benign.  Patient does have hx of CAD, HTN, and ischemic cardiomyopathy. Since glucose levels are stable in ED and symptoms persist, can not rule out cardiac etiology. Will work up to see if lightheadedness and weakness is cardiac in origin. Will also continue to monitor glucose levels.  Patient found to have profound orthostatic hypotension. Administering fluids as well as dextrose bolus in an effort to combat her hypotension.  Patient's BP is stabilizing, although she remains hypotensive. Dr. Hyacinth Meeker has consulted with internist who suspects that her hypotension could be due to acute renal failure along with dehydration. Patient's glucose levels remain stable. In light of profound hypotension on initial presentation, will admit  patient to telemetry with diagnosis of ARF and dehydration for further work up.         Antony Madura, PA-C 01/31/12 [phone]

## 2012-01-31 ENCOUNTER — Encounter (HOSPITAL_COMMUNITY): Payer: Self-pay | Admitting: Internal Medicine

## 2012-01-31 DIAGNOSIS — I509 Heart failure, unspecified: Secondary | ICD-10-CM

## 2012-01-31 DIAGNOSIS — R5383 Other fatigue: Secondary | ICD-10-CM

## 2012-01-31 DIAGNOSIS — I959 Hypotension, unspecified: Secondary | ICD-10-CM | POA: Diagnosis present

## 2012-01-31 DIAGNOSIS — N179 Acute kidney failure, unspecified: Principal | ICD-10-CM

## 2012-01-31 DIAGNOSIS — R531 Weakness: Secondary | ICD-10-CM | POA: Diagnosis present

## 2012-01-31 DIAGNOSIS — I5022 Chronic systolic (congestive) heart failure: Secondary | ICD-10-CM

## 2012-01-31 LAB — URINALYSIS, ROUTINE W REFLEX MICROSCOPIC
Glucose, UA: NEGATIVE mg/dL
Hgb urine dipstick: NEGATIVE
Specific Gravity, Urine: 1.022 (ref 1.005–1.030)
pH: 5 (ref 5.0–8.0)

## 2012-01-31 LAB — CBC
MCV: 91.2 fL (ref 78.0–100.0)
Platelets: 169 10*3/uL (ref 150–400)
RDW: 14.2 % (ref 11.5–15.5)
WBC: 8.5 10*3/uL (ref 4.0–10.5)

## 2012-01-31 LAB — BASIC METABOLIC PANEL
CO2: 26 mEq/L (ref 19–32)
Calcium: 9.4 mg/dL (ref 8.4–10.5)
Creatinine, Ser: 1.68 mg/dL — ABNORMAL HIGH (ref 0.50–1.10)

## 2012-01-31 LAB — PRO B NATRIURETIC PEPTIDE: Pro B Natriuretic peptide (BNP): 893.8 pg/mL — ABNORMAL HIGH (ref 0–125)

## 2012-01-31 LAB — URINE MICROSCOPIC-ADD ON

## 2012-01-31 MED ORDER — ENOXAPARIN SODIUM 40 MG/0.4ML ~~LOC~~ SOLN
40.0000 mg | SUBCUTANEOUS | Status: DC
  Administered 2012-01-31 – 2012-02-01 (×2): 40 mg via SUBCUTANEOUS
  Filled 2012-01-31 (×2): qty 0.4

## 2012-01-31 MED ORDER — INSULIN ASPART 100 UNIT/ML ~~LOC~~ SOLN: 0.0000 [IU] | Freq: Three times a day (TID) | SUBCUTANEOUS | Status: DC

## 2012-01-31 MED ORDER — ONDANSETRON HCL 4 MG/2ML IJ SOLN: 4.0000 mg | Freq: Four times a day (QID) | INTRAMUSCULAR | Status: DC | PRN

## 2012-01-31 MED ORDER — ACETAMINOPHEN 650 MG RE SUPP: 650.0000 mg | Freq: Four times a day (QID) | RECTAL | Status: DC | PRN

## 2012-01-31 MED ORDER — DEXTROSE 50 % IV SOLN
0.5000 | Freq: Once | INTRAVENOUS | Status: AC
  Administered 2012-01-31: 25 mL via INTRAVENOUS
  Filled 2012-01-31: qty 50

## 2012-01-31 MED ORDER — HYDROCORTISONE 1 % EX CREA
1.0000 "application " | TOPICAL_CREAM | Freq: Two times a day (BID) | CUTANEOUS | Status: DC
  Administered 2012-02-01: 1 via TOPICAL
  Filled 2012-01-31 (×2): qty 28

## 2012-01-31 MED ORDER — FERROUS SULFATE 325 (65 FE) MG PO TABS
325.0000 mg | ORAL_TABLET | Freq: Every day | ORAL | Status: DC
  Administered 2012-01-31 – 2012-02-01 (×2): 325 mg via ORAL
  Filled 2012-01-31 (×3): qty 1

## 2012-01-31 MED ORDER — INSULIN ASPART 100 UNIT/ML ~~LOC~~ SOLN
0.0000 [IU] | Freq: Three times a day (TID) | SUBCUTANEOUS | Status: DC
  Administered 2012-01-31 (×2): 2 [IU] via SUBCUTANEOUS
  Administered 2012-01-31: 3 [IU] via SUBCUTANEOUS
  Administered 2012-02-01: 2 [IU] via SUBCUTANEOUS
  Administered 2012-02-01: 1 [IU] via SUBCUTANEOUS
  Administered 2012-02-01: 2 [IU] via SUBCUTANEOUS

## 2012-01-31 MED ORDER — SODIUM CHLORIDE 0.9 % IJ SOLN
3.0000 mL | Freq: Two times a day (BID) | INTRAMUSCULAR | Status: DC
  Administered 2012-01-31 – 2012-02-01 (×2): 3 mL via INTRAVENOUS

## 2012-01-31 MED ORDER — ONDANSETRON HCL 4 MG/2ML IJ SOLN
4.0000 mg | Freq: Once | INTRAMUSCULAR | Status: AC
  Administered 2012-01-31: 4 mg via INTRAVENOUS

## 2012-01-31 MED ORDER — INFLUENZA VIRUS VACC SPLIT PF IM SUSP
0.5000 mL | INTRAMUSCULAR | Status: AC
  Administered 2012-02-01: 0.5 mL via INTRAMUSCULAR
  Filled 2012-01-31: qty 0.5

## 2012-01-31 MED ORDER — ASPIRIN 325 MG PO TABS
325.0000 mg | ORAL_TABLET | Freq: Every day | ORAL | Status: DC
  Administered 2012-01-31 – 2012-02-01 (×2): 325 mg via ORAL
  Filled 2012-01-31 (×2): qty 1

## 2012-01-31 MED ORDER — SODIUM CHLORIDE 0.9 % IV BOLUS (SEPSIS)
1000.0000 mL | Freq: Once | INTRAVENOUS | Status: AC
  Administered 2012-01-31: 1000 mL via INTRAVENOUS

## 2012-01-31 MED ORDER — ATORVASTATIN CALCIUM 80 MG PO TABS
80.0000 mg | ORAL_TABLET | Freq: Every day | ORAL | Status: DC
  Administered 2012-01-31: 80 mg via ORAL
  Filled 2012-01-31 (×2): qty 1

## 2012-01-31 MED ORDER — SODIUM CHLORIDE 0.9 % IV SOLN
INTRAVENOUS | Status: AC
  Administered 2012-01-31 (×2): via INTRAVENOUS

## 2012-01-31 MED ORDER — NITROGLYCERIN 0.4 MG SL SUBL: 0.4000 mg | SUBLINGUAL_TABLET | SUBLINGUAL | Status: DC | PRN

## 2012-01-31 MED ORDER — ONDANSETRON HCL 4 MG/2ML IJ SOLN
INTRAMUSCULAR | Status: AC
  Administered 2012-01-31: 4 mg via INTRAVENOUS
  Filled 2012-01-31: qty 2

## 2012-01-31 MED ORDER — ONDANSETRON HCL 4 MG PO TABS: 4.0000 mg | ORAL_TABLET | Freq: Four times a day (QID) | ORAL | Status: DC | PRN

## 2012-01-31 MED ORDER — ACETAMINOPHEN 325 MG PO TABS: 650.0000 mg | ORAL_TABLET | Freq: Four times a day (QID) | ORAL | Status: DC | PRN

## 2012-01-31 NOTE — H&P (Signed)
[name] is an 62 y.o. female.          Patient was seen and examined on January 31, 2012. PCP - [facility]. Cardiologist - [facility]. Chief Complaint: Weakness. HPI: 62 year-old female with history of CAD status post CABG, OSA, ischemic cardiomyopathy last EF measured was 30-35%, diabetes mellitus type 2 presented with complaints of feeling weak since yesterday morning. Denies any chest pain shortness of breath nausea vomiting diarrhea abdominal pain fever chills. Patient feels weak to walk and feels dizzy in doing so. Patient thought her blood sugars were running high but in the ER was found to be hyperglycemic. In the ER in addition patient was found to be hypotensive and was given 1 L fluid bolus when her blood pressure improved from 70 systolics to high 90s. Otherwise patient's chest x-ray EKG(did show mild bradycardia) cardiac enzymes and labs were unremarkable except for creatinine being increased from baseline 1.2-1.7. Patient has been admitted for further management.  Past Medical History  Diagnosis Date  . Coronary artery disease     s/p CABG 2007  . Sleep apnea     uses O2 at night  . Ischemic cardiomyopathy     EF 30-35%, s/p ICD 4/08 by Dr Amil Amen  . Diabetes mellitus     type II  . Hypertension   . Obesity   . CHF (congestive heart failure)   . Oxygen dependent 2 L    Past Surgical History  Procedure Date  . Cardiac catheterization   . Cardiac defibrillator placement 4/08    by Dr Amil Amen (MDT)  . Coronary artery bypass graft 2007    Family History  Problem Relation Age of Onset  . Diabetes Mother 31    died - HTN  . Other      No early family hx of CAD   Social History:  reports that she has never smoked. She does not have any smokeless tobacco history on file. She reports that she does not drink alcohol or use illicit drugs.  Allergies:  Allergies  Allergen Reactions  . Metformin And Related Other (See Comments)    Leg pain      (Not in a hospital admission)  Results for orders placed during the hospital encounter of 01/30/12 (from the past 48 hour(s))  GLUCOSE, CAPILLARY     Status: Normal   Collection Time   01/30/12 10:51 PM      Component Value Range Comment   Glucose-Capillary 78  70 - 99 mg/dL   GLUCOSE, CAPILLARY     Status: Normal   Collection Time   01/30/12 10:58 PM      Component Value Range Comment   Glucose-Capillary 84  70 - 99 mg/dL   CBC WITH DIFFERENTIAL     Status: Normal   Collection Time   01/30/12 11:00 PM      Component Value Range Comment   WBC 6.3  4.0 - 10.5 K/uL    RBC 4.78  3.87 - 5.11 MIL/uL    Hemoglobin 14.7  12.0 - 15.0 g/dL    HCT 40.9  81.1 - 91.4 %    MCV 90.2  78.0 - 100.0 fL    MCH 30.8  26.0 - 34.0 pg    MCHC 34.1  30.0 - 36.0 g/dL    RDW 78.2  95.6 - 21.3 %    Platelets 187  150 - 400 K/uL    Neutrophils Relative 47  43 - 77 %  Neutro Abs 3.0  1.7 - 7.7 K/uL    Lymphocytes Relative 41  12 - 46 %    Lymphs Abs 2.6  0.7 - 4.0 K/uL    Monocytes Relative 9  3 - 12 %    Monocytes Absolute 0.6  0.1 - 1.0 K/uL    Eosinophils Relative 3  0 - 5 %    Eosinophils Absolute 0.2  0.0 - 0.7 K/uL    Basophils Relative 0  0 - 1 %    Basophils Absolute 0.0  0.0 - 0.1 K/uL   COMPREHENSIVE METABOLIC PANEL     Status: Abnormal   Collection Time   01/30/12 11:00 PM      Component Value Range Comment   Sodium 138  135 - 145 mEq/L    Potassium 4.3  3.5 - 5.1 mEq/L    Chloride 101  96 - 112 mEq/L    CO2 27  19 - 32 mEq/L    Glucose, Bld 77  70 - 99 mg/dL    BUN 21  6 - 23 mg/dL    Creatinine, Ser 1.61 (*) 0.50 - 1.10 mg/dL    Calcium 09.6  8.4 - 10.5 mg/dL    Total Protein 7.5  6.0 - 8.3 g/dL    Albumin 3.2 (*) 3.5 - 5.2 g/dL    AST 15  0 - 37 U/L    ALT 15  0 - 35 U/L    Alkaline Phosphatase 95  39 - 117 U/L    Total Bilirubin 0.4  0.3 - 1.2 mg/dL    GFR calc non Af Amer 31 (*) >90 mL/min    GFR calc Af Amer 36 (*) >90 mL/min   PRO B NATRIURETIC PEPTIDE     Status:  Abnormal   Collection Time   01/30/12 11:36 PM      Component Value Range Comment   Pro B Natriuretic peptide (BNP) 893.8 (*) 0 - 125 pg/mL   POCT I-STAT TROPONIN I     Status: Normal   Collection Time   01/30/12 11:43 PM      Component Value Range Comment   Troponin i, poc 0.02  0.00 - 0.08 ng/mL    Comment 3            URINALYSIS, ROUTINE W REFLEX MICROSCOPIC     Status: Abnormal   Collection Time   01/31/12  1:40 AM      Component Value Range Comment   Color, Urine AMBER (*) YELLOW BIOCHEMICALS MAY BE AFFECTED BY COLOR   APPearance CLOUDY (*) CLEAR    Specific Gravity, Urine 1.022  1.005 - 1.030    pH 5.0  5.0 - 8.0    Glucose, UA NEGATIVE  NEGATIVE mg/dL    Hgb urine dipstick NEGATIVE  NEGATIVE    Bilirubin Urine MODERATE (*) NEGATIVE    Ketones, ur 15 (*) NEGATIVE mg/dL    Protein, ur 30 (*) NEGATIVE mg/dL    Urobilinogen, UA 1.0  0.0 - 1.0 mg/dL    Nitrite NEGATIVE  NEGATIVE    Leukocytes, UA TRACE (*) NEGATIVE   URINE MICROSCOPIC-ADD ON     Status: Abnormal   Collection Time   01/31/12  1:40 AM      Component Value Range Comment   Squamous Epithelial / LPF FEW (*) RARE    WBC, UA 0-2  <3 WBC/hpf    RBC / HPF 0-2  <3 RBC/hpf    Bacteria, UA FEW (*) RARE  Casts HYALINE CASTS (*) NEGATIVE    Crystals CA OXALATE CRYSTALS (*) NEGATIVE    Urine-Other MUCOUS PRESENT      Dg Chest 2 View  01/31/2012  *RADIOLOGY REPORT*  Clinical Data: Shortness of breath and weakness.  CHEST - 2 VIEW  Comparison: 12/27/2011  Findings: Two views of the chest demonstrates a left cardiac ICD. Again noted is enlargement of the cardiac silhouette and status post median sternotomy. Prominent interstitial lung markings are concerning for mild edema.  No definite pleural effusions.  IMPRESSION: Cardiomegaly and concern for mild edema.   Original Report Authenticated By: Richarda Overlie, M.D.     Review of Systems  Constitutional: Positive for malaise/fatigue.  HENT: Negative.   Eyes: Negative.     Respiratory: Negative.   Cardiovascular: Negative.   Gastrointestinal: Negative.   Genitourinary: Negative.   Musculoskeletal: Negative.   Skin: Negative.   Neurological: Positive for dizziness and weakness.  Endo/Heme/Allergies: Negative.   Psychiatric/Behavioral: Negative.     Blood pressure 102/47, pulse 56, temperature 97.4 F (36.3 C), temperature source Oral, resp. rate 14, SpO2 97.00%. Physical Exam  Constitutional: She is oriented to person, place, and time. She appears well-developed and well-nourished. No distress.  HENT:  Head: Normocephalic and atraumatic.  Right Ear: External ear normal.  Left Ear: External ear normal.  Nose: Nose normal.  Mouth/Throat: Oropharynx is clear and moist. No oropharyngeal exudate.  Eyes: Conjunctivae normal are normal. Pupils are equal, round, and reactive to light. Right eye exhibits no discharge. Left eye exhibits no discharge. No scleral icterus.  Neck: Normal range of motion. Neck supple.  Cardiovascular: Regular rhythm.        Mild bradycardia.  Respiratory: Effort normal and breath sounds normal. No respiratory distress. She has no wheezes. She has no rales.  GI: Soft. Bowel sounds are normal. She exhibits no distension. There is no tenderness. There is no rebound.  Musculoskeletal: She exhibits no edema and no tenderness.  Neurological: She is alert and oriented to person, place, and time.  Skin: Skin is warm and dry. She is not diaphoretic.  Psychiatric: Her behavior is normal.     Assessment/Plan #1. Weakness and hypotension - probably from dehydration. Continue with hydration. Patient is to received a second liter now. I have written for one only.which we will stop since patient has history of ischemic cardiomyopathy and cautioned not to overload. Hold antihypertensives. #2. Acute renal failure - probably from dehydration and hypotension. Hold antihypertensives continue to hydrate as suggested #1. Closely follow intake output  and metabolic panel. #3. Diabetes mellitus2 - present mildly hypoglycemic we'll hold off insulin now. Closely follow CBGs with sliding-scale coverage. #4. History of ischemic cardiomyopathy last EF measured was 35-40% - see #1. #5. Hyperlipidemia - continue present medications.  CODE STATUS - full code.   KAKRAKANDY,ARSHAD N. 01/31/2012, 4:12 AM

## 2012-01-31 NOTE — ED Notes (Signed)
I completed an In and Out on the patient.

## 2012-01-31 NOTE — Progress Notes (Signed)
[facility] PROGRESS NOTE  [name] OZH:[id number] DOB: 04-11-1950 DOA: 01/30/2012 PCP: Talmage Coin, MD  Assessment/Plan: Principal Problem:  *Weakness Active Problems:  Chronic systolic congestive heart failure  CAD (coronary artery disease)  Hypotension     #1. Weakness and hypotension - probably from dehydration. Patient has received liter and a half overnight currently at 100 cc per hour. Decreased to [facility] given history of ischemic cardiomyopathy and cautioned not to overload. Hold antihypertensives.  #2. Acute renal failure - probably from dehydration and hypotension. Hold antihypertensives continue to hydrate as suggested #1. Closely follow intake output and metabolic panel.  #3. Diabetes mellitus2 - we'll start the patient on sliding scale insulin. Closely follow CBGs with sliding-scale coverage.  #4. History of ischemic cardiomyopathy last EF measured was 35-40% - see #1.  #5. Hyperlipidemia - continue present medications   Code Status: full Family Communication: family updated about patient's clinical progress Disposition Plan:  PT OT eval ordered   Brief narrative: 62 year-old female with history of CAD status post CABG, OSA, ischemic cardiomyopathy last EF measured was 30-35%, diabetes mellitus type 2 presented with complaints of feeling weak since yesterday morning. Denies any chest pain shortness of breath nausea vomiting diarrhea abdominal pain fever chills. Patient feels weak to walk and feels dizzy in doing so. Patient thought her blood sugars were running high but in the ER was found to be hyperglycemic. In the ER in addition patient was found to be hypotensive and was given 1 L fluid bolus when her blood pressure improved from 70 systolics to high 90s. Otherwise patient's chest x-ray EKG(did show mild bradycardia) cardiac enzymes and labs were unremarkable except for creatinine being increased from baseline 1.2-1.7. Patient has been admitted for further  management   Consultants:  none  Procedures: none  Antibiotics: none  HPI/Subjective: *Hemodynamically stable overnight   Objective: Filed Vitals:   01/31/12 0449 01/31/12 0450 01/31/12 0451 01/31/12 0915  BP: 108/68 110/62 131/77 104/54  Pulse: 60 60 62 58  Temp: 97.5 F (36.4 C)   97.4 F (36.3 C)  TempSrc: Oral   Oral  Resp: 18 17 17 16   Weight: 119.8 kg (264 lb 1.8 oz)     SpO2: 92% 97% 96% 99%    Intake/Output Summary (Last 24 hours) at 01/31/12 1247 Last data filed at 01/31/12 0600  Gross per 24 hour  Intake   1005 ml  Output      0 ml  Net   1005 ml    Exam:  HENT:  Head: Atraumatic.  Nose: Nose normal.  Mouth/Throat: Oropharynx is clear and moist.  Eyes: Conjunctivae are normal. Pupils are equal, round, and reactive to light. No scleral icterus.  Neck: Neck supple. No tracheal deviation present.  Cardiovascular: Normal rate, regular rhythm, normal heart sounds and intact distal pulses.  Pulmonary/Chest: Effort normal and breath sounds normal. No respiratory distress.  Abdominal: Soft. Normal appearance and bowel sounds are normal. She exhibits no distension. There is no tenderness.  Musculoskeletal: She exhibits no edema and no tenderness.  Neurological: She is alert. No cranial nerve deficit.    Data Reviewed: Basic Metabolic Panel:  Lab 01/31/12 6295 01/30/12 2300  NA 139 138  K 5.1 4.3  CL 102 101  CO2 26 27  GLUCOSE 180* 77  BUN 23 21  CREATININE 1.68* 1.71*  CALCIUM 9.4 10.0  MG -- --  PHOS -- --    Liver Function Tests:  Lab 01/30/12 2300  AST 15  ALT  15  ALKPHOS 95  BILITOT 0.4  PROT 7.5  ALBUMIN 3.2*   No results found for this basename: LIPASE:5,AMYLASE:5 in the last 168 hours No results found for this basename: AMMONIA:5 in the last 168 hours  CBC:  Lab 01/31/12 0620 01/30/12 2300  WBC 8.5 6.3  NEUTROABS -- 3.0  HGB 13.0 14.7  HCT 39.5 43.1  MCV 91.2 90.2  PLT 169 187    Cardiac Enzymes: No results found  for this basename: CKTOTAL:5,CKMB:5,CKMBINDEX:5,TROPONINI:5 in the last 168 hours BNP (last 3 results)  Basename 01/30/12 2336 10/25/11 0651 03/23/11 2142  PROBNP 893.8* 374.4* 803.9*     CBG:  Lab 01/31/12 1138 01/31/12 0754 01/31/12 0440 01/30/12 2258 01/30/12 2251  GLUCAP 186* 157* 135* 84 78    No results found for this or any previous visit (from the past 240 hour(s)).   Studies: Dg Chest 2 View  01/31/2012  *RADIOLOGY REPORT*  Clinical Data: Shortness of breath and weakness.  CHEST - 2 VIEW  Comparison: 12/27/2011  Findings: Two views of the chest demonstrates a left cardiac ICD. Again noted is enlargement of the cardiac silhouette and status post median sternotomy. Prominent interstitial lung markings are concerning for mild edema.  No definite pleural effusions.  IMPRESSION: Cardiomegaly and concern for mild edema.   Original Report Authenticated By: Richarda Overlie, M.D.     Scheduled Meds:   . aspirin  325 mg Oral Daily  . atorvastatin  80 mg Oral q1800  . enoxaparin (LOVENOX) injection  40 mg Subcutaneous Q24H  . ferrous sulfate  325 mg Oral Q breakfast  . influenza  inactive virus vaccine  0.5 mL Intramuscular Tomorrow-1000  . insulin aspart  0-9 Units Subcutaneous TID WC  . sodium chloride  3 mL Intravenous Q12H   Continuous Infusions:   . sodium chloride 100 mL/hr at 01/31/12 1030    Principal Problem:  *Weakness Active Problems:  Chronic systolic congestive heart failure  CAD (coronary artery disease)  Hypotension    Time spent: 40 minutes   [facility]  [facility] Pager [phone]. If 8PM-8AM, please contact night-coverage at [email or web address], password [facility] 01/31/2012, 12:47 PM  LOS: 1 day

## 2012-01-31 NOTE — Progress Notes (Signed)
Pt complaining of itching between breasts. States that she has a rash from sweating, picked scab off. Pt has a small red, open area of skin between breasts. States she uses hydrocortisone cream to tx this at home. MD notified, will continue to monitor. Non medicated powder placed under breasts and skin folds at this time.

## 2012-02-01 LAB — GLUCOSE, CAPILLARY
Glucose-Capillary: 141 mg/dL — ABNORMAL HIGH (ref 70–99)
Glucose-Capillary: 196 mg/dL — ABNORMAL HIGH (ref 70–99)

## 2012-02-01 LAB — BASIC METABOLIC PANEL
BUN: 19 mg/dL (ref 6–23)
CO2: 32 mEq/L (ref 19–32)
Chloride: 103 mEq/L (ref 96–112)
Glucose, Bld: 162 mg/dL — ABNORMAL HIGH (ref 70–99)
Potassium: 4.5 mEq/L (ref 3.5–5.1)

## 2012-02-01 MED ORDER — LISINOPRIL 5 MG PO TABS: 10.0000 mg | ORAL_TABLET | Freq: Every day | ORAL | Status: DC

## 2012-02-01 MED ORDER — FUROSEMIDE 80 MG PO TABS: 40.0000 mg | ORAL_TABLET | Freq: Two times a day (BID) | ORAL | Status: DC

## 2012-02-01 NOTE — Progress Notes (Signed)
Pt escorted via wheelchair to exit of facility. Son here to take her home. [place], Rosanna Randy

## 2012-02-01 NOTE — Progress Notes (Signed)
Occupational Therapy Treatment Patient Details Name: JALEI SHIBLEY MRN: [id number] DOB: 11-May-1950 Today's Date: 02/01/2012 Time: 4098-1191 OT Time Calculation (min): 10 min  OT Assessment / Plan / Recommendation Comments on Treatment Session This 62 yo making progress, AE will make ADLs less taxing on her.    Follow Up Recommendations  No OT follow up    Barriers to Discharge  None    Equipment Recommendations  Tub/shower seat (with a back)       Frequency Min 2X/week   Plan Discharge plan remains appropriate    Precautions / Restrictions Precautions Precautions: Fall Precaution Comments: 2L O2 at home at night Restrictions Weight Bearing Restrictions: No       ADL  Eating/Feeding: Simulated;Independent Lower Body Dressing: Performed;Set up (with AE) Where Assessed - Lower Body Dressing: Unsupported sitting ADL Comments: Issued pt AE (reacher, sock aide, and long handled shoe horn). Gave pt handout on energy conservation and went over some of the most common strategies---pt verbalized understanding      OT Goals ADL Goals Pt Will Perform Lower Body Dressing: with modified independence;Unsupported;with adaptive equipment;Sit to stand from bed;Sit to stand from chair ADL Goal: Lower Body Dressing - Progress: Progressing toward goals Miscellaneous OT Goals Miscellaneous OT Goal #1: Pt will be able to verbalize understanding of energy conservation stratgies from handout OT Goal: Miscellaneous Goal #1 - Progress: Met  Visit Information  Last OT Received On: 02/01/12 Assistance Needed: +1           Cognition  Overall Cognitive Status: Appears within functional limits for tasks assessed/performed Arousal/Alertness: Awake/alert Orientation Level: Appears intact for tasks assessed Behavior During Session: [facility] for tasks performed             End of Session OT - End of Session Equipment Utilized During Treatment:  (AE) Activity Tolerance: Patient tolerated  treatment well Patient left: in chair;with call bell/phone within reach       Evette Georges [phone] 02/01/2012, 1:25 PM

## 2012-02-01 NOTE — Evaluation (Signed)
Occupational Therapy Evaluation Patient Details Name: [name] MRN: [id number] DOB: [birth year] Today's Date: 02/01/2012 Time: 1000-1026 OT Time Calculation (min): 26 min  OT Assessment / Plan / Recommendation Clinical Impression  This 62 yo female admitted with weakness presents to acute OT with problems below. Will benefit from acute OT without need for follow up.    OT Assessment  Patient needs continued OT Services    Follow Up Recommendations  No OT follow up    Barriers to Discharge None    Equipment Recommendations  Tub/shower seat (with a back)       Frequency  Min 2X/week    Precautions / Restrictions Precautions Precautions: Fall Precaution Comments: 2L O2 at home Restrictions Weight Bearing Restrictions: No   Pertinent Vitals/Pain Wears O2 night. During our session today she dropped to 92 on RA    ADL  Eating/Feeding: Simulated;Independent Where Assessed - Eating/Feeding: Edge of bed Grooming: Performed;Wash/dry hands;Supervision/safety Where Assessed - Grooming: Unsupported standing Upper Body Bathing: Simulated;Supervision/safety;Set up Where Assessed - Upper Body Bathing: Unsupported sitting Lower Body Bathing: Simulated;Supervision/safety;Set up Where Assessed - Lower Body Bathing: Unsupported sit to stand Upper Body Dressing: Simulated;Set up Where Assessed - Upper Body Dressing: Unsupported sitting Lower Body Dressing: Simulated;Supervision/safety;Set up Where Assessed - Lower Body Dressing: Unsupported sit to stand Toilet Transfer: Performed;Min guard Toilet Transfer Method: Sit to Barista: Comfort height toilet;Grab bars Toileting - Clothing Manipulation and Hygiene: Performed;Supervision/safety Where Assessed - Engineer, mining and Hygiene: Sit to stand from 3-in-1 or toilet Equipment Used: Gait belt Transfers/Ambulation Related to ADLs: Supervision for sit to stand and stand to sit, Min guard A for  ambulation ADL Comments: Educated pt on purse lipped breathing    OT Diagnosis: Generalized weakness  OT Problem List: Decreased strength;Decreased activity tolerance;Impaired balance (sitting and/or standing);Decreased knowledge of use of DME or AE;Cardiopulmonary status limiting activity OT Treatment Interventions: Self-care/ADL training;DME and/or AE instruction;Patient/family education;Balance training;Energy conservation   OT Goals Acute Rehab OT Goals OT Goal Formulation: With patient Time For Goal Achievement: 02/15/12 Potential to Achieve Goals: Good ADL Goals Pt Will Perform Grooming: Independently;Standing at sink;Unsupported ADL Goal: Grooming - Progress: Goal set today Pt Will Perform Lower Body Bathing: with modified independence;Unsupported;with adaptive equipment;Sit to stand from chair;Sit to stand from bed ADL Goal: Lower Body Bathing - Progress: Goal set today Pt Will Perform Lower Body Dressing: with modified independence;Unsupported;with adaptive equipment;Sit to stand from bed;Sit to stand from chair ADL Goal: Lower Body Dressing - Progress: Goal set today Miscellaneous OT Goals Miscellaneous OT Goal #1: Pt will be able to verbalize understanding of energy conservation stratgies from handout OT Goal: Miscellaneous Goal #1 - Progress: Goal set today  Visit Information  Last OT Received On: 02/01/12 Assistance Needed: +1 PT/OT Co-Evaluation/Treatment: Yes    Subjective Data  Patient Stated Goal: Be normal   Prior Functioning     Home Living Lives With: Son Available Help at Discharge: Family;Available PRN/intermittently Type of Home: House Home Access: Stairs to enter [employer] of Steps: 2 Entrance Stairs-Rails: None Home Layout: One level Bathroom Shower/Tub: Engineer, manufacturing systems: Standard Bathroom Accessibility: Yes How Accessible: Accessible via walker Home Adaptive Equipment: None Prior Function Level of Independence:  Independent Able to Take Stairs?: Yes Driving: Yes Vocation: On disability Communication Communication: No difficulties Dominant Hand: Right         Vision/Perception  Wears bifocals   Cognition  Overall Cognitive Status: Appears within functional limits for tasks assessed/performed Arousal/Alertness: Awake/alert Orientation  Level: Appears intact for tasks assessed Behavior During Session: [facility] for tasks performed    Extremity/Trunk Assessment Right Upper Extremity Assessment RUE ROM/Strength/Tone: [facility] for tasks assessed Left Upper Extremity Assessment LUE ROM/Strength/Tone: WFL for tasks assessed Right Lower Extremity Assessment RLE ROM/Strength/Tone: Within functional levels RLE Sensation: WFL - Light Touch Left Lower Extremity Assessment LLE ROM/Strength/Tone: Within functional levels LLE Sensation: WFL - Light Touch Trunk Assessment Trunk Assessment: Normal     Mobility Bed Mobility Bed Mobility: Supine to Sit;Sitting - Scoot to Edge of Bed Supine to Sit: 6: Modified independent (Device/Increase time);With rails;HOB elevated Sitting - Scoot to Edge of Bed: 6: Modified independent (Device/Increase time) Details for Bed Mobility Assistance: managed well with use of rail Transfers Sit to Stand: 5: Supervision;From bed;From chair/3-in-1 Stand to Sit: 5: Supervision;To chair/3-in-1;To toilet Details for Transfer Assistance: needed a little extra time to rise to stand due to body habitus, safe technique              End of Session OT - End of Session Equipment Utilized During Treatment: Gait belt Activity Tolerance: Patient tolerated treatment well Patient left: in chair;with call bell/phone within reach       Evette Georges [phone] 02/01/2012, 10:46 AM

## 2012-02-01 NOTE — Evaluation (Signed)
Physical Therapy Evaluation Patient Details Name: [name] MRN: [id number] DOB: [birth year] Today's Date: 02/01/2012 Time: 1000-1031 PT Time Calculation (min): 31 min  PT Assessment / Plan / Recommendation Clinical Impression  Pt. was admitted with weakness and dizziness and has h/o CAD s/p CABG, OSA, ischemic cardiomyopathy EF 30-35%, and diabetes.  She presents to PT at a lower level of  functional mobility and gait than her usual baseline level.  She will benefit from PT on acute to improve her functional level for return to her home setting where she is alone during the day.    PT Assessment  Patient needs continued PT services    Follow Up Recommendations  Home health PT;Supervision - Intermittent    Does the patient have the potential to tolerate intense rehabilitation      Barriers to Discharge None      Equipment Recommendations  None recommended by PT;Other (comment) (depends on her progress)    Recommendations for Other Services     Frequency Min 3X/week    Precautions / Restrictions Precautions Precautions: Fall Precaution Comments: 2L O2 at home Restrictions Weight Bearing Restrictions: No   Pertinent Vitals/Pain O2 sats stable with ambulation on room air (at lowest point 92%, HR 88 at max).  Pt. Was returned to 2L O2 at end of session, but it appears that she can return to her use of O2 at night only once she Dc's home.      Mobility  Bed Mobility Bed Mobility: Supine to Sit;Sitting - Scoot to Edge of Bed Supine to Sit: 6: Modified independent (Device/Increase time);With rails;HOB elevated Sitting - Scoot to Edge of Bed: 6: Modified independent (Device/Increase time) Details for Bed Mobility Assistance: managed well with use of rail Transfers Transfers: Sit to Stand;Stand to Sit Sit to Stand: 5: Supervision;From bed;From chair/3-in-1 Stand to Sit: 5: Supervision;To chair/3-in-1;To toilet Details for Transfer Assistance: needed a little extra time to  rise to stand due to body habitus, safe technique Ambulation/Gait Ambulation/Gait Assistance: 4: Min guard Ambulation Distance (Feet): 100 Feet Assistive device: None Ambulation/Gait Assistance Details: Pt. needed increased time to ambulate and required one brief standing rest break along the way.  She did not have any overt LOB but had increased lateral shifting likely due to body habitus.  Min guard assis for safety Gait Pattern: Step-through pattern Gait velocity: slowed Stairs: No Wheelchair Mobility Wheelchair Mobility: No    Shoulder Instructions     Exercises     PT Diagnosis: Difficulty walking;Generalized weakness  PT Problem List:   PT Treatment Interventions: DME instruction;Gait training;Functional mobility training;Therapeutic activities;Therapeutic exercise;Patient/family education   PT Goals Acute Rehab PT Goals PT Goal Formulation: With patient Time For Goal Achievement: 02/08/12 Potential to Achieve Goals: Good Pt will go Supine/Side to Sit: Independently;with HOB 0 degrees PT Goal: Supine/Side to Sit - Progress: Goal set today Pt will go Sit to Supine/Side: Independently;with HOB 0 degrees PT Goal: Sit to Supine/Side - Progress: Goal set today Pt will go Sit to Stand: Independently PT Goal: Sit to Stand - Progress: Goal set today Pt will go Stand to Sit: Independently PT Goal: Stand to Sit - Progress: Goal set today Pt will Transfer Bed to Chair/Chair to Bed: Independently PT Transfer Goal: Bed to Chair/Chair to Bed - Progress: Goal set today Pt will Ambulate: >150 feet;with least restrictive assistive device PT Goal: Ambulate - Progress: Goal set today  Visit Information  Last PT Received On: 02/01/12 Assistance Needed: +1    Subjective Data  Subjective: "I feel like about 25% of myself" Patient Stated Goal: return to independent level   Prior Functioning  Home Living Lives With: Son Available Help at Discharge: Family;Available  PRN/intermittently Type of Home: House Home Access: Stairs to enter [employer] of Steps: 2 Entrance Stairs-Rails: None Home Layout: One level Bathroom Shower/Tub: Engineer, manufacturing systems: Standard Bathroom Accessibility: Yes How Accessible: Accessible via walker Home Adaptive Equipment: None Prior Function Level of Independence: Independent Able to Take Stairs?: Yes Driving: Yes Vocation: On disability Communication Communication: No difficulties Dominant Hand: Right    Cognition  Overall Cognitive Status: Appears within functional limits for tasks assessed/performed Arousal/Alertness: Awake/alert Orientation Level: Appears intact for tasks assessed Behavior During Session: [facility] for tasks performed    Extremity/Trunk Assessment Right Upper Extremity Assessment RUE ROM/Strength/Tone: [facility] for tasks assessed Left Upper Extremity Assessment LUE ROM/Strength/Tone: WFL for tasks assessed Right Lower Extremity Assessment RLE ROM/Strength/Tone: Within functional levels RLE Sensation: WFL - Light Touch Left Lower Extremity Assessment LLE ROM/Strength/Tone: Within functional levels LLE Sensation: WFL - Light Touch Trunk Assessment Trunk Assessment: Normal   Balance    End of Session PT - End of Session Equipment Utilized During Treatment: Gait belt;Oxygen Activity Tolerance: Patient tolerated treatment well Patient left: in chair;with call bell/phone within reach Nurse Communication: Mobility status  GP     Ferman Hamming 02/01/2012, 10:48 AM Weldon Picking PT Acute Rehab Services [phone] Bee[place] [phone]

## 2012-02-01 NOTE — Progress Notes (Signed)
AVS reviewed with pt; teach back method used. Pt verbalized understanding of AVS and questions were answered. RX's given. IV and tele removed. Pt remains stable. Pt awaiting ride home. [place], Rosanna Randy

## 2012-02-01 NOTE — Care Management Note (Signed)
   CARE MANAGEMENT NOTE 02/01/2012  Patient:  ARNOLD, DEPINTO   Account Number:  [id number]  Date Initiated:  02/01/2012  Documentation initiated by:  Johny Shock  Subjective/Objective Assessment:   Order for HHPT     Action/Plan:   Met with pt who is insured by [facility] [place] [facility]. HHPT would not be covered however will arrange for [facility] to visit for safety evaluation and medication management.   Anticipated DC Date:  02/01/2012   Anticipated DC Plan:  HOME W HOME HEALTH SERVICES         [facility] Choice  HOME HEALTH   Choice offered to / List presented to:  C-1 Patient        HH arranged  HH-1 RN      [facility] agency  [facility]   Status of service:  Completed, signed off Medicare Important Message given?   (If response is "NO", the following Medicare IM given date fields will be blank) Date Medicare IM given:   Date Additional Medicare IM given:    Discharge Disposition:  HOME W HOME HEALTH SERVICES  Per UR Regulation:    If discussed at Long Length of Stay Meetings, dates discussed:    Comments:

## 2012-02-01 NOTE — Discharge Summary (Addendum)
Physician Discharge Summary  [name] MRN: [id number] DOB/AGE: 62-18-1952 62 y.o.  PCP: Talmage Coin, MD   Admit date: 01/30/2012 Discharge date: 02/01/2012  Discharge Diagnoses:     *Weakness Active Problems:  Chronic systolic congestive heart failure  CAD (coronary artery disease)  Hypotension     Medication List     As of 02/01/2012 12:14 PM    TAKE these medications         aspirin 325 MG tablet   Take 325 mg by mouth daily.      atorvastatin 20 MG tablet   Commonly known as: LIPITOR   Take 80 mg by mouth daily.      carvedilol 12.5 MG tablet   Commonly known as: COREG   Take 25 mg by mouth 2 (two) times daily with a meal.      ferrous sulfate 325 (65 FE) MG tablet   Take 325 mg by mouth daily with breakfast.      furosemide 80 MG tablet   Commonly known as: LASIX   Take 0.5 tablets (40 mg total) by mouth 2 (two) times daily.      HUMALOG 100 UNIT/ML injection   Generic drug: insulin lispro   Inject 54 Units into the skin 3 (three) times daily after meals.      isosorbide mononitrate 60 MG 24 hr tablet   Commonly known as: IMDUR   Take 60 mg by mouth daily.      LANTUS 100 UNIT/ML injection   Generic drug: insulin glargine   Inject 90 Units into the skin at bedtime.      lisinopril 5 MG tablet   Commonly known as: PRINIVIL,ZESTRIL   Take 2 tablets (10 mg total) by mouth daily.      nitroGLYCERIN 0.4 MG SL tablet   Commonly known as: NITROSTAT   Place 1 tablet (0.4 mg total) under the tongue every 5 (five) minutes x 3 doses as needed for chest pain.      potassium chloride SA 20 MEQ tablet   Commonly known as: K-DUR,KLOR-CON   Take 20 mEq by mouth daily.      spironolactone 25 MG tablet   Commonly known as: ALDACTONE   Take 25 mg by mouth daily.        Discharge Condition: Stable  Disposition: 01-Home or Self Care   Consults: None*   Significant Diagnostic Studies: Dg Chest 2 View  01/31/2012  *RADIOLOGY REPORT*   Clinical Data: Shortness of breath and weakness.  CHEST - 2 VIEW  Comparison: 12/27/2011  Findings: Two views of the chest demonstrates a left cardiac ICD. Again noted is enlargement of the cardiac silhouette and status post median sternotomy. Prominent interstitial lung markings are concerning for mild edema.  No definite pleural effusions.  IMPRESSION: Cardiomegaly and concern for mild edema.   Original Report Authenticated By: Richarda Overlie, M.D.        Microbiology: No results found for this or any previous visit (from the past 240 hour(s)).   Labs: Results for orders placed during the hospital encounter of 01/30/12 (from the past 48 hour(s))  GLUCOSE, CAPILLARY     Status: Normal   Collection Time   01/30/12 10:51 PM      Component Value Range Comment   Glucose-Capillary 78  70 - 99 mg/dL   GLUCOSE, CAPILLARY     Status: Normal   Collection Time   01/30/12 10:58 PM      Component Value Range Comment  Glucose-Capillary 84  70 - 99 mg/dL   CBC WITH DIFFERENTIAL     Status: Normal   Collection Time   01/30/12 11:00 PM      Component Value Range Comment   WBC 6.3  4.0 - 10.5 K/uL    RBC 4.78  3.87 - 5.11 MIL/uL    Hemoglobin 14.7  12.0 - 15.0 g/dL    HCT 40.9  81.1 - 91.4 %    MCV 90.2  78.0 - 100.0 fL    MCH 30.8  26.0 - 34.0 pg    MCHC 34.1  30.0 - 36.0 g/dL    RDW 78.2  95.6 - 21.3 %    Platelets 187  150 - 400 K/uL    Neutrophils Relative 47  43 - 77 %    Neutro Abs 3.0  1.7 - 7.7 K/uL    Lymphocytes Relative 41  12 - 46 %    Lymphs Abs 2.6  0.7 - 4.0 K/uL    Monocytes Relative 9  3 - 12 %    Monocytes Absolute 0.6  0.1 - 1.0 K/uL    Eosinophils Relative 3  0 - 5 %    Eosinophils Absolute 0.2  0.0 - 0.7 K/uL    Basophils Relative 0  0 - 1 %    Basophils Absolute 0.0  0.0 - 0.1 K/uL   COMPREHENSIVE METABOLIC PANEL     Status: Abnormal   Collection Time   01/30/12 11:00 PM      Component Value Range Comment   Sodium 138  135 - 145 mEq/L    Potassium 4.3  3.5 - 5.1 mEq/L      Chloride 101  96 - 112 mEq/L    CO2 27  19 - 32 mEq/L    Glucose, Bld 77  70 - 99 mg/dL    BUN 21  6 - 23 mg/dL    Creatinine, Ser 0.86 (*) 0.50 - 1.10 mg/dL    Calcium 57.8  8.4 - 10.5 mg/dL    Total Protein 7.5  6.0 - 8.3 g/dL    Albumin 3.2 (*) 3.5 - 5.2 g/dL    AST 15  0 - 37 U/L    ALT 15  0 - 35 U/L    Alkaline Phosphatase 95  39 - 117 U/L    Total Bilirubin 0.4  0.3 - 1.2 mg/dL    GFR calc non Af Amer 31 (*) >90 mL/min    GFR calc Af Amer 36 (*) >90 mL/min   PRO B NATRIURETIC PEPTIDE     Status: Abnormal   Collection Time   01/30/12 11:36 PM      Component Value Range Comment   Pro B Natriuretic peptide (BNP) 893.8 (*) 0 - 125 pg/mL   POCT I-STAT TROPONIN I     Status: Normal   Collection Time   01/30/12 11:43 PM      Component Value Range Comment   Troponin i, poc 0.02  0.00 - 0.08 ng/mL    Comment 3            URINALYSIS, ROUTINE W REFLEX MICROSCOPIC     Status: Abnormal   Collection Time   01/31/12  1:40 AM      Component Value Range Comment   Color, Urine AMBER (*) YELLOW BIOCHEMICALS MAY BE AFFECTED BY COLOR   APPearance CLOUDY (*) CLEAR    Specific Gravity, Urine 1.022  1.005 - 1.030    pH 5.0  5.0 -  8.0    Glucose, UA NEGATIVE  NEGATIVE mg/dL    Hgb urine dipstick NEGATIVE  NEGATIVE    Bilirubin Urine MODERATE (*) NEGATIVE    Ketones, ur 15 (*) NEGATIVE mg/dL    Protein, ur 30 (*) NEGATIVE mg/dL    Urobilinogen, UA 1.0  0.0 - 1.0 mg/dL    Nitrite NEGATIVE  NEGATIVE    Leukocytes, UA TRACE (*) NEGATIVE   URINE MICROSCOPIC-ADD ON     Status: Abnormal   Collection Time   01/31/12  1:40 AM      Component Value Range Comment   Squamous Epithelial / LPF FEW (*) RARE    WBC, UA 0-2  <3 WBC/hpf    RBC / HPF 0-2  <3 RBC/hpf    Bacteria, UA FEW (*) RARE    Casts HYALINE CASTS (*) NEGATIVE    Crystals CA OXALATE CRYSTALS (*) NEGATIVE    Urine-Other MUCOUS PRESENT     GLUCOSE, CAPILLARY     Status: Abnormal   Collection Time   01/31/12  4:40 AM      Component  Value Range Comment   Glucose-Capillary 135 (*) 70 - 99 mg/dL   BASIC METABOLIC PANEL     Status: Abnormal   Collection Time   01/31/12  6:20 AM      Component Value Range Comment   Sodium 139  135 - 145 mEq/L    Potassium 5.1  3.5 - 5.1 mEq/L    Chloride 102  96 - 112 mEq/L    CO2 26  19 - 32 mEq/L    Glucose, Bld 180 (*) 70 - 99 mg/dL    BUN 23  6 - 23 mg/dL    Creatinine, Ser 1.30 (*) 0.50 - 1.10 mg/dL    Calcium 9.4  8.4 - 86.5 mg/dL    GFR calc non Af Amer 32 (*) >90 mL/min    GFR calc Af Amer 37 (*) >90 mL/min   CBC     Status: Normal   Collection Time   01/31/12  6:20 AM      Component Value Range Comment   WBC 8.5  4.0 - 10.5 K/uL    RBC 4.33  3.87 - 5.11 MIL/uL    Hemoglobin 13.0  12.0 - 15.0 g/dL    HCT 78.4  69.6 - 29.5 %    MCV 91.2  78.0 - 100.0 fL    MCH 30.0  26.0 - 34.0 pg    MCHC 32.9  30.0 - 36.0 g/dL    RDW 28.4  13.2 - 44.0 %    Platelets 169  150 - 400 K/uL   GLUCOSE, CAPILLARY     Status: Abnormal   Collection Time   01/31/12  7:54 AM      Component Value Range Comment   Glucose-Capillary 157 (*) 70 - 99 mg/dL   GLUCOSE, CAPILLARY     Status: Abnormal   Collection Time   01/31/12 11:38 AM      Component Value Range Comment   Glucose-Capillary 186 (*) 70 - 99 mg/dL   HEMOGLOBIN N0U     Status: Abnormal   Collection Time   01/31/12  4:58 PM      Component Value Range Comment   Hemoglobin A1C 9.5 (*) <5.7 %    Mean Plasma Glucose 226 (*) <117 mg/dL   GLUCOSE, CAPILLARY     Status: Abnormal   Collection Time   01/31/12  5:09 PM      Component  Value Range Comment   Glucose-Capillary 236 (*) 70 - 99 mg/dL   GLUCOSE, CAPILLARY     Status: Abnormal   Collection Time   01/31/12  9:06 PM      Component Value Range Comment   Glucose-Capillary 293 (*) 70 - 99 mg/dL   BASIC METABOLIC PANEL     Status: Abnormal   Collection Time   02/01/12  5:00 AM      Component Value Range Comment   Sodium 139  135 - 145 mEq/L    Potassium 4.5  3.5 - 5.1 mEq/L     Chloride 103  96 - 112 mEq/L    CO2 32  19 - 32 mEq/L    Glucose, Bld 162 (*) 70 - 99 mg/dL    BUN 19  6 - 23 mg/dL    Creatinine, Ser 9.14  0.50 - 1.10 mg/dL    Calcium 9.5  8.4 - 78.2 mg/dL    GFR calc non Af Amer 63 (*) >90 mL/min    GFR calc Af Amer 72 (*) >90 mL/min   GLUCOSE, CAPILLARY     Status: Abnormal   Collection Time   02/01/12  7:31 AM      Component Value Range Comment   Glucose-Capillary 141 (*) 70 - 99 mg/dL   GLUCOSE, CAPILLARY     Status: Abnormal   Collection Time   02/01/12 11:44 AM      Component Value Range Comment   Glucose-Capillary 196 (*) 70 - 99 mg/dL      HPI   62 year-old female with history of CAD status post CABG, OSA, ischemic cardiomyopathy last EF measured was 30-35%, diabetes mellitus type 2 presented with complaints of feeling weak since yesterday morning. Denies any chest pain shortness of breath nausea vomiting diarrhea abdominal pain fever chills. Patient feels weak to walk and feels dizzy in doing so. Patient thought her blood sugars were running high but in the ER was found to be hyperglycemic. In the ER in addition patient was found to be hypotensive and was given 1 L fluid bolus when her blood pressure improved from 70 systolics to high 90s. Otherwise patient's chest x-ray EKG(did show mild bradycardia) cardiac enzymes and labs were unremarkable except for creatinine being increased from baseline 1.2-1.7. Patient has been admitted for further management.  HOSPITAL COURSE:   1. Weakness and hypotension - probably from dehydration. Patient has received liter and a half overnight currently at creatinine has come down from 1.68-0.96. Patient's Lasix and lisinopril dose has been decreased #2. Acute renal failure - probably from dehydration and hypotension. We have decreased the dose of lisinopril and Lasix to half. Will need repeat BMP in a week #3. Diabetes mellitus2 - has been stable. Continue Lantus and Humalog at home.   #4. History of ischemic  cardiomyopathy last EF measured was 35-40% - without exacerbation #5. Hyperlipidemia - continue present medications    Discharge Exam:  Blood pressure 142/67, pulse 65, temperature 98 F (36.7 C), temperature source Oral, resp. rate 18, height 5\' 5"  (1.651 m), weight 121.9 kg (268 lb 11.9 oz), SpO2 99.00%.  Head: Normocephalic and atraumatic.  Right Ear: External ear normal.  Left Ear: External ear normal.  Nose: Nose normal.  Mouth/Throat: Oropharynx is clear and moist. No oropharyngeal exudate.  Eyes: Conjunctivae normal are normal. Pupils are equal, round, and reactive to light. Right eye exhibits no discharge. Left eye exhibits no discharge. No scleral icterus.  Neck: Normal range of motion. Neck  supple.  Cardiovascular: Regular rhythm.  Mild bradycardia.  Respiratory: Effort normal and breath sounds normal. No respiratory distress. She has no wheezes. She has no rales.  GI: Soft. Bowel sounds are normal. She exhibits no distension. There is no tenderness. There is no rebound.  Musculoskeletal: She exhibits no edema and no tenderness.  Neurological: She is alert and oriented to person, place, and time.  Skin: Skin is warm and dry. She is not diaphoretic.  Psychiatric: Her behavior is normal.         Discharge Orders    Future Appointments: Provider: Department: Dept Phone: Center:   04/14/2012 11:30 AM [facility] [employer] [facility] [place]) [phone] [id number]        Signed: Richarda Overlie 02/01/2012, 12:14 PM

## 2012-02-08 NOTE — ED Provider Notes (Signed)
Medical screening examination/treatment/procedure(s) were performed by non-physician practitioner and as supervising physician I was immediately available for consultation/collaboration.    Corney Knighton L Aalaya Yadao, MD 02/08/12 0005 

## 2012-02-21 ENCOUNTER — Encounter: Payer: Medicaid Other | Admitting: Internal Medicine

## 2012-02-25 ENCOUNTER — Other Ambulatory Visit: Payer: Self-pay | Admitting: *Deleted

## 2012-02-25 DIAGNOSIS — Z1231 Encounter for screening mammogram for malignant neoplasm of breast: Secondary | ICD-10-CM

## 2012-03-10 ENCOUNTER — Encounter: Payer: Self-pay | Admitting: Internal Medicine

## 2012-03-10 ENCOUNTER — Ambulatory Visit (INDEPENDENT_AMBULATORY_CARE_PROVIDER_SITE_OTHER): Payer: Medicaid Other | Admitting: Internal Medicine

## 2012-03-10 VITALS — BP 118/80 | Ht 65.0 in | Wt 266.1 lb

## 2012-03-10 DIAGNOSIS — N179 Acute kidney failure, unspecified: Secondary | ICD-10-CM

## 2012-03-10 DIAGNOSIS — I5022 Chronic systolic (congestive) heart failure: Secondary | ICD-10-CM

## 2012-03-10 DIAGNOSIS — I2589 Other forms of chronic ischemic heart disease: Secondary | ICD-10-CM

## 2012-03-10 DIAGNOSIS — I5023 Acute on chronic systolic (congestive) heart failure: Secondary | ICD-10-CM

## 2012-03-10 DIAGNOSIS — I10 Essential (primary) hypertension: Secondary | ICD-10-CM

## 2012-03-10 DIAGNOSIS — I255 Ischemic cardiomyopathy: Secondary | ICD-10-CM

## 2012-03-10 DIAGNOSIS — I509 Heart failure, unspecified: Secondary | ICD-10-CM

## 2012-03-10 DIAGNOSIS — N189 Chronic kidney disease, unspecified: Secondary | ICD-10-CM | POA: Insufficient documentation

## 2012-03-10 LAB — ICD DEVICE OBSERVATION
AL IMPEDENCE ICD: 584 Ohm
BAMS-0001: 170 {beats}/min
BATTERY VOLTAGE: 2.92 V
RV LEAD AMPLITUDE: 9.7 mv
RV LEAD IMPEDENCE ICD: 416 Ohm
TZAT-0001ATACH: 1
TZAT-0001ATACH: 2
TZAT-0001ATACH: 3
TZAT-0001FASTVT: 1
TZAT-0004SLOWVT: 8
TZAT-0004SLOWVT: 8
TZAT-0005SLOWVT: 88 pct
TZAT-0005SLOWVT: 91 pct
TZAT-0011SLOWVT: 10 ms
TZAT-0011SLOWVT: 10 ms
TZAT-0012ATACH: 150 ms
TZAT-0012ATACH: 150 ms
TZAT-0012ATACH: 150 ms
TZAT-0012SLOWVT: 200 ms
TZAT-0012SLOWVT: 200 ms
TZAT-0013SLOWVT: 1
TZAT-0013SLOWVT: 1
TZAT-0018ATACH: NEGATIVE
TZAT-0018ATACH: NEGATIVE
TZAT-0018FASTVT: NEGATIVE
TZAT-0019ATACH: 6 V
TZAT-0020ATACH: 1.5 ms
TZON-0003ATACH: 350 ms
TZON-0003SLOWVT: 350 ms
TZON-0004SLOWVT: 20
TZON-0004VSLOWVT: 20
TZST-0001ATACH: 4
TZST-0001ATACH: 6
TZST-0001FASTVT: 2
TZST-0001FASTVT: 4
TZST-0001FASTVT: 6
TZST-0001SLOWVT: 6
TZST-0002ATACH: NEGATIVE
TZST-0002FASTVT: NEGATIVE
TZST-0002FASTVT: NEGATIVE
TZST-0003SLOWVT: 35 J
TZST-0003SLOWVT: 35 J

## 2012-03-10 MED ORDER — FUROSEMIDE 40 MG PO TABS: ORAL_TABLET | ORAL | Status: DC

## 2012-03-10 NOTE — Progress Notes (Signed)
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 female who presents today for electrophysiology followup.  She was recently hospitalized for renal failure.  Her lasix was decreased.  Since that time, her weight has increased 9 lbs.  She reports worsening edema and SOB.   Today, she denies symptoms of palpitations, chest pain,  dizziness, presyncope, syncope, or ICD shocks.  The patient is otherwise without complaint today.   Past Medical History  Diagnosis Date  . Coronary artery disease     s/p CABG 2007  . Sleep apnea     uses O2 at night  . Ischemic cardiomyopathy     EF 30-35%, s/p ICD 4/08 by Dr Amil Amen  . Diabetes mellitus     type II  . Hypertension   . Obesity   . CHF (congestive heart failure)   . Oxygen dependent 2 L   Past Surgical History  Procedure Laterality Date  . Cardiac catheterization    . Cardiac defibrillator placement  4/08    by Dr Amil Amen ([facility])  . Coronary artery bypass graft  2007    Current Outpatient Prescriptions  Medication Sig Dispense Refill  . aspirin 325 MG tablet Take 325 mg by mouth daily.        Marland Kitchen atorvastatin (LIPITOR) 20 MG tablet Take 80 mg by mouth daily.       . carvedilol (COREG) 12.5 MG tablet Take 25 mg by mouth 2 (two) times daily with a meal.       . ferrous sulfate 325 (65 FE) MG tablet Take 325 mg by mouth daily with breakfast.      . furosemide (LASIX) 40 MG tablet Take 2 tablets in the morning and 1 in the evening  90 tablet  3  . insulin glargine (LANTUS) 100 UNIT/ML injection Inject 90 Units into the skin at bedtime.       . insulin lispro (HUMALOG) 100 UNIT/ML injection Inject 54 Units into the skin 3 (three) times daily after meals.       . isosorbide mononitrate (IMDUR) 60 MG 24 hr tablet Take 60 mg by mouth daily.      Marland Kitchen lisinopril (PRINIVIL,ZESTRIL) 5 MG tablet Take 2 tablets (10 mg total) by mouth daily.  30 tablet  0  . nitroGLYCERIN (NITROSTAT) 0.4 MG SL tablet Place 1  tablet (0.4 mg total) under the tongue every 5 (five) minutes x 3 doses as needed for chest pain.  30 tablet  12  . potassium chloride SA (K-DUR,KLOR-CON) 20 MEQ tablet Take 20 mEq by mouth daily.        Marland Kitchen spironolactone (ALDACTONE) 25 MG tablet Take 25 mg by mouth daily.         No current facility-administered medications for this visit.    Physical Exam: Filed Vitals:   03/10/12 1440  BP: 118/80  Height: 5\' 5"  (1.651 m)  Weight: 266 lb 1.9 oz (120.711 kg)    GEN- The patient is chronically ill and obese appearing, alert and oriented x 3 today.   Head- normocephalic, atraumatic Eyes-  Sclera clear, conjunctiva pink Ears- hearing intact Oropharynx- clear Lungs- Clear to ausculation bilaterally, normal work of breathing Chest- ICD pocket is well healed Heart- Regular rate and rhythm, no murmurs, rubs or gallops, PMI not laterally displaced GI- soft, NT, ND, + BS Extremities- no clubbing, cyanosis, +2 edema Neuro- strength/ sensation are intact  ICD interrogation- reviewed in detail today,  See PACE[facility]  report  Assessment and Plan:  1.  Acute on Chronic systolic dysfunction volume overloaded today Increase lasix to 80mg  BID x 3 days, then 80mg  qam and 40mg  qpm bmet today 2 gram sodium restriction Follow-up with Dr Anne Fu in 1 week  Normal ICD function.  Optivol is very elevated and consistent with acute on chronic systolic CHF See Pace Art report No changes today  2. Acute renal failure Recently hospitalized with renal failure Will obtain a bmet Cautious with lasix Sodium restriction advised  3. CAD/ ischemic CM No ischemic symptoms Continue medical therapy  Return to the device clinic in 3 months for device follow-up

## 2012-03-10 NOTE — Patient Instructions (Addendum)
Your physician recommends that you schedule a follow-up appointment in: 3 months with device clinic.    Dr Anne Fu will call you to come in and see him  Will need to come back for lab work this week   Your physician has recommended you make the following change in your medication:  1) Take Furosemide 80mg  in the morning and 40mg  at night

## 2012-03-17 ENCOUNTER — Other Ambulatory Visit: Payer: Medicaid Other

## 2012-03-20 ENCOUNTER — Ambulatory Visit
Admission: RE | Admit: 2012-03-20 | Discharge: 2012-03-20 | Disposition: A | Discharge status: Home / Self Care | Payer: Medicaid Other | Source: Ambulatory Visit | Attending: *Deleted | Admitting: *Deleted

## 2012-04-14 ENCOUNTER — Encounter: Payer: Self-pay | Admitting: Internal Medicine

## 2012-04-14 ENCOUNTER — Ambulatory Visit (INDEPENDENT_AMBULATORY_CARE_PROVIDER_SITE_OTHER): Payer: Medicaid Other | Admitting: *Deleted

## 2012-04-14 ENCOUNTER — Other Ambulatory Visit: Payer: Self-pay | Admitting: Internal Medicine

## 2012-04-14 DIAGNOSIS — I2589 Other forms of chronic ischemic heart disease: Secondary | ICD-10-CM

## 2012-04-14 DIAGNOSIS — I255 Ischemic cardiomyopathy: Secondary | ICD-10-CM

## 2012-04-14 DIAGNOSIS — I5023 Acute on chronic systolic (congestive) heart failure: Secondary | ICD-10-CM

## 2012-04-14 LAB — ICD DEVICE OBSERVATION
ATRIAL PACING ICD: 0 pct
BAMS-0001: 170 {beats}/min
CHARGE TIME: 9.669 s
DEV-0020ICD: NEGATIVE
FVT: 0
PACEART VT: 0
RV LEAD AMPLITUDE: 10.7 mv
TOT-0001: 1
TOT-0002: 0
TZAT-0001ATACH: 1
TZAT-0001ATACH: 2
TZAT-0001ATACH: 3
TZAT-0001FASTVT: 1
TZAT-0002ATACH: NEGATIVE
TZAT-0002FASTVT: NEGATIVE
TZAT-0004SLOWVT: 8
TZAT-0004SLOWVT: 8
TZAT-0005SLOWVT: 88 pct
TZAT-0005SLOWVT: 91 pct
TZAT-0011SLOWVT: 10 ms
TZAT-0012ATACH: 150 ms
TZAT-0012ATACH: 150 ms
TZAT-0012SLOWVT: 200 ms
TZAT-0012SLOWVT: 200 ms
TZAT-0013SLOWVT: 1
TZAT-0013SLOWVT: 1
TZAT-0018ATACH: NEGATIVE
TZAT-0018ATACH: NEGATIVE
TZAT-0018FASTVT: NEGATIVE
TZAT-0018SLOWVT: NEGATIVE
TZAT-0018SLOWVT: NEGATIVE
TZAT-0019ATACH: 6 V
TZAT-0019ATACH: 6 V
TZAT-0019FASTVT: 8 V
TZON-0003ATACH: 350 ms
TZON-0003SLOWVT: 350 ms
TZON-0004SLOWVT: 32
TZON-0004VSLOWVT: 20
TZON-0005SLOWVT: 12
TZST-0001ATACH: 4
TZST-0001FASTVT: 2
TZST-0001FASTVT: 3
TZST-0001FASTVT: 4
TZST-0001FASTVT: 6
TZST-0001SLOWVT: 4
TZST-0001SLOWVT: 6
TZST-0002ATACH: NEGATIVE
TZST-0002FASTVT: NEGATIVE
TZST-0002FASTVT: NEGATIVE
TZST-0002FASTVT: NEGATIVE
TZST-0003SLOWVT: 35 J
TZST-0003SLOWVT: 35 J
VENTRICULAR PACING ICD: 0 pct

## 2012-04-14 NOTE — Progress Notes (Signed)
ICD check with ICM 

## 2012-04-23 ENCOUNTER — Encounter (HOSPITAL_COMMUNITY): Payer: Self-pay

## 2012-04-23 ENCOUNTER — Emergency Department (HOSPITAL_COMMUNITY): Payer: Medicaid Other

## 2012-04-23 ENCOUNTER — Inpatient Hospital Stay (HOSPITAL_COMMUNITY)
Admission: EM | Admit: 2012-04-23 | Discharge: 2012-04-25 | DRG: 871 | Disposition: A | Discharge status: Home Health | Payer: Medicaid Other | Attending: Family Medicine | Admitting: Family Medicine

## 2012-04-23 DIAGNOSIS — I959 Hypotension, unspecified: Secondary | ICD-10-CM | POA: Diagnosis present

## 2012-04-23 DIAGNOSIS — E119 Type 2 diabetes mellitus without complications: Secondary | ICD-10-CM

## 2012-04-23 DIAGNOSIS — I255 Ischemic cardiomyopathy: Secondary | ICD-10-CM | POA: Diagnosis present

## 2012-04-23 DIAGNOSIS — I6509 Occlusion and stenosis of unspecified vertebral artery: Secondary | ICD-10-CM | POA: Diagnosis present

## 2012-04-23 DIAGNOSIS — IMO0001 Reserved for inherently not codable concepts without codable children: Secondary | ICD-10-CM | POA: Diagnosis present

## 2012-04-23 DIAGNOSIS — G4733 Obstructive sleep apnea (adult) (pediatric): Secondary | ICD-10-CM | POA: Diagnosis present

## 2012-04-23 DIAGNOSIS — G459 Transient cerebral ischemic attack, unspecified: Secondary | ICD-10-CM | POA: Diagnosis present

## 2012-04-23 DIAGNOSIS — I2510 Atherosclerotic heart disease of native coronary artery without angina pectoris: Secondary | ICD-10-CM | POA: Diagnosis present

## 2012-04-23 DIAGNOSIS — I6529 Occlusion and stenosis of unspecified carotid artery: Secondary | ICD-10-CM | POA: Diagnosis present

## 2012-04-23 DIAGNOSIS — I10 Essential (primary) hypertension: Secondary | ICD-10-CM | POA: Diagnosis present

## 2012-04-23 DIAGNOSIS — I5022 Chronic systolic (congestive) heart failure: Secondary | ICD-10-CM | POA: Diagnosis present

## 2012-04-23 DIAGNOSIS — R109 Unspecified abdominal pain: Secondary | ICD-10-CM | POA: Diagnosis present

## 2012-04-23 DIAGNOSIS — E860 Dehydration: Secondary | ICD-10-CM | POA: Diagnosis present

## 2012-04-23 DIAGNOSIS — R29898 Other symptoms and signs involving the musculoskeletal system: Secondary | ICD-10-CM | POA: Diagnosis present

## 2012-04-23 DIAGNOSIS — R0602 Shortness of breath: Secondary | ICD-10-CM | POA: Diagnosis present

## 2012-04-23 DIAGNOSIS — R4182 Altered mental status, unspecified: Secondary | ICD-10-CM

## 2012-04-23 DIAGNOSIS — G92 Toxic encephalopathy: Secondary | ICD-10-CM | POA: Diagnosis present

## 2012-04-23 DIAGNOSIS — N179 Acute kidney failure, unspecified: Secondary | ICD-10-CM

## 2012-04-23 DIAGNOSIS — I2589 Other forms of chronic ischemic heart disease: Secondary | ICD-10-CM | POA: Diagnosis present

## 2012-04-23 DIAGNOSIS — I6350 Cerebral infarction due to unspecified occlusion or stenosis of unspecified cerebral artery: Secondary | ICD-10-CM | POA: Diagnosis present

## 2012-04-23 DIAGNOSIS — E785 Hyperlipidemia, unspecified: Secondary | ICD-10-CM | POA: Diagnosis present

## 2012-04-23 DIAGNOSIS — I672 Cerebral atherosclerosis: Secondary | ICD-10-CM | POA: Diagnosis present

## 2012-04-23 DIAGNOSIS — G9340 Encephalopathy, unspecified: Secondary | ICD-10-CM | POA: Diagnosis present

## 2012-04-23 DIAGNOSIS — N390 Urinary tract infection, site not specified: Secondary | ICD-10-CM | POA: Diagnosis present

## 2012-04-23 DIAGNOSIS — Z951 Presence of aortocoronary bypass graft: Secondary | ICD-10-CM

## 2012-04-23 DIAGNOSIS — I639 Cerebral infarction, unspecified: Secondary | ICD-10-CM

## 2012-04-23 DIAGNOSIS — I5023 Acute on chronic systolic (congestive) heart failure: Secondary | ICD-10-CM

## 2012-04-23 DIAGNOSIS — Z9981 Dependence on supplemental oxygen: Secondary | ICD-10-CM

## 2012-04-23 DIAGNOSIS — I509 Heart failure, unspecified: Secondary | ICD-10-CM | POA: Diagnosis present

## 2012-04-23 DIAGNOSIS — A419 Sepsis, unspecified organism: Principal | ICD-10-CM | POA: Diagnosis present

## 2012-04-23 DIAGNOSIS — G929 Unspecified toxic encephalopathy: Secondary | ICD-10-CM | POA: Diagnosis present

## 2012-04-23 DIAGNOSIS — R6510 Systemic inflammatory response syndrome (SIRS) of non-infectious origin without acute organ dysfunction: Secondary | ICD-10-CM | POA: Diagnosis present

## 2012-04-23 DIAGNOSIS — Z794 Long term (current) use of insulin: Secondary | ICD-10-CM

## 2012-04-23 DIAGNOSIS — N189 Chronic kidney disease, unspecified: Secondary | ICD-10-CM | POA: Diagnosis present

## 2012-04-23 DIAGNOSIS — Z95810 Presence of automatic (implantable) cardiac defibrillator: Secondary | ICD-10-CM

## 2012-04-23 DIAGNOSIS — I209 Angina pectoris, unspecified: Secondary | ICD-10-CM | POA: Diagnosis present

## 2012-04-23 DIAGNOSIS — Z6841 Body Mass Index (BMI) 40.0 and over, adult: Secondary | ICD-10-CM

## 2012-04-23 HISTORY — DX: Reserved for inherently not codable concepts without codable children: IMO0001

## 2012-04-23 LAB — URINALYSIS, ROUTINE W REFLEX MICROSCOPIC
Glucose, UA: NEGATIVE mg/dL
pH: 6 (ref 5.0–8.0)

## 2012-04-23 LAB — CK TOTAL AND CKMB (NOT AT ARMC): CK, MB: 1.6 ng/mL (ref 0.3–4.0)

## 2012-04-23 LAB — POCT I-STAT TROPONIN I: Troponin i, poc: 0 ng/mL (ref 0.00–0.08)

## 2012-04-23 LAB — COMPREHENSIVE METABOLIC PANEL
AST: 15 U/L (ref 0–37)
Albumin: 3.4 g/dL — ABNORMAL LOW (ref 3.5–5.2)
BUN: 19 mg/dL (ref 6–23)
Creatinine, Ser: 1.47 mg/dL — ABNORMAL HIGH (ref 0.50–1.10)
Potassium: 4.9 mEq/L (ref 3.5–5.1)
Total Protein: 7.8 g/dL (ref 6.0–8.3)

## 2012-04-23 LAB — CBC WITH DIFFERENTIAL/PLATELET
Basophils Absolute: 0 10*3/uL (ref 0.0–0.1)
Basophils Relative: 1 % (ref 0–1)
Eosinophils Absolute: 0.1 10*3/uL (ref 0.0–0.7)
Hemoglobin: 13.7 g/dL (ref 12.0–15.0)
MCH: 30.2 pg (ref 26.0–34.0)
MCHC: 34.1 g/dL (ref 30.0–36.0)
Monocytes Absolute: 0.5 10*3/uL (ref 0.1–1.0)
Monocytes Relative: 9 % (ref 3–12)
Neutrophils Relative %: 51 % (ref 43–77)
RDW: 14.1 % (ref 11.5–15.5)

## 2012-04-23 LAB — GLUCOSE, CAPILLARY: Glucose-Capillary: 252 mg/dL — ABNORMAL HIGH (ref 70–99)

## 2012-04-23 LAB — CREATININE, SERUM
Creatinine, Ser: 1.48 mg/dL — ABNORMAL HIGH (ref 0.50–1.10)
GFR calc Af Amer: 43 mL/min — ABNORMAL LOW (ref >90)
GFR calc non Af Amer: 37 mL/min — ABNORMAL LOW (ref >90)

## 2012-04-23 LAB — POCT I-STAT 3, ART BLOOD GAS (G3+)
Acid-Base Excess: 2 mmol/L (ref 0.0–2.0)
pCO2 arterial: 44 mmHg (ref 35.0–45.0)
pO2, Arterial: 62 mmHg — ABNORMAL LOW (ref 80.0–100.0)

## 2012-04-23 LAB — HEPATIC FUNCTION PANEL
Albumin: 3 g/dL — ABNORMAL LOW (ref 3.5–5.2)
Bilirubin, Direct: 0.1 mg/dL (ref 0.0–0.3)
Total Bilirubin: 0.6 mg/dL (ref 0.3–1.2)

## 2012-04-23 LAB — PROTIME-INR: INR: 1.05 (ref 0.00–1.49)

## 2012-04-23 LAB — APTT: aPTT: 27 seconds (ref 24–37)

## 2012-04-23 LAB — CBC
Hemoglobin: 12.2 g/dL (ref 12.0–15.0)
MCHC: 33.5 g/dL (ref 30.0–36.0)
RDW: 14.4 % (ref 11.5–15.5)
WBC: 7.7 10*3/uL (ref 4.0–10.5)

## 2012-04-23 LAB — MAGNESIUM: Magnesium: 1.9 mg/dL (ref 1.5–2.5)

## 2012-04-23 LAB — MRSA PCR SCREENING: MRSA by PCR: NEGATIVE

## 2012-04-23 LAB — PHOSPHORUS: Phosphorus: 3.7 mg/dL (ref 2.3–4.6)

## 2012-04-23 LAB — URINE MICROSCOPIC-ADD ON

## 2012-04-23 MED ORDER — VANCOMYCIN HCL 10 G IV SOLR
2000.0000 mg | Freq: Once | INTRAVENOUS | Status: AC
  Administered 2012-04-23: 2000 mg via INTRAVENOUS
  Filled 2012-04-23: qty 2000

## 2012-04-23 MED ORDER — ONDANSETRON HCL 4 MG PO TABS: 4.0000 mg | ORAL_TABLET | Freq: Four times a day (QID) | ORAL | Status: DC | PRN

## 2012-04-23 MED ORDER — ACETAMINOPHEN 325 MG PO TABS: 650.0000 mg | ORAL_TABLET | Freq: Four times a day (QID) | ORAL | Status: DC | PRN

## 2012-04-23 MED ORDER — PIPERACILLIN-TAZOBACTAM 3.375 G IVPB
3.3750 g | Freq: Three times a day (TID) | INTRAVENOUS | Status: DC
  Administered 2012-04-23 – 2012-04-24 (×2): 3.375 g via INTRAVENOUS
  Filled 2012-04-23 (×5): qty 50

## 2012-04-23 MED ORDER — INSULIN ASPART 100 UNIT/ML ~~LOC~~ SOLN
0.0000 [IU] | Freq: Three times a day (TID) | SUBCUTANEOUS | Status: DC
  Administered 2012-04-24: 3 [IU] via SUBCUTANEOUS
  Administered 2012-04-24: 5 [IU] via SUBCUTANEOUS
  Administered 2012-04-24 – 2012-04-25 (×2): 3 [IU] via SUBCUTANEOUS
  Administered 2012-04-25: 8 [IU] via SUBCUTANEOUS

## 2012-04-23 MED ORDER — ONDANSETRON HCL 4 MG/2ML IJ SOLN: 4.0000 mg | Freq: Four times a day (QID) | INTRAMUSCULAR | Status: DC | PRN

## 2012-04-23 MED ORDER — SODIUM CHLORIDE 0.9 % IV BOLUS (SEPSIS)
1250.0000 mL | Freq: Once | INTRAVENOUS | Status: AC
  Administered 2012-04-23: 1250 mL via INTRAVENOUS

## 2012-04-23 MED ORDER — ASPIRIN 325 MG PO TABS
325.0000 mg | ORAL_TABLET | Freq: Every day | ORAL | Status: DC
  Administered 2012-04-23 – 2012-04-25 (×3): 325 mg via ORAL
  Filled 2012-04-23 (×3): qty 1

## 2012-04-23 MED ORDER — ACETAMINOPHEN 650 MG RE SUPP: 650.0000 mg | Freq: Four times a day (QID) | RECTAL | Status: DC | PRN

## 2012-04-23 MED ORDER — INSULIN ASPART 100 UNIT/ML ~~LOC~~ SOLN
4.0000 [IU] | Freq: Three times a day (TID) | SUBCUTANEOUS | Status: DC
  Administered 2012-04-24 – 2012-04-25 (×5): 4 [IU] via SUBCUTANEOUS

## 2012-04-23 MED ORDER — INSULIN GLARGINE 100 UNIT/ML ~~LOC~~ SOLN
60.0000 [IU] | Freq: Every day | SUBCUTANEOUS | Status: DC
  Administered 2012-04-23 – 2012-04-24 (×2): 60 [IU] via SUBCUTANEOUS
  Filled 2012-04-23 (×4): qty 0.6

## 2012-04-23 MED ORDER — FERROUS SULFATE 325 (65 FE) MG PO TABS
325.0000 mg | ORAL_TABLET | Freq: Every day | ORAL | Status: DC
  Administered 2012-04-24 – 2012-04-25 (×2): 325 mg via ORAL
  Filled 2012-04-23 (×3): qty 1

## 2012-04-23 MED ORDER — HEPARIN SODIUM (PORCINE) 5000 UNIT/ML IJ SOLN
5000.0000 [IU] | Freq: Three times a day (TID) | INTRAMUSCULAR | Status: DC
  Administered 2012-04-23 – 2012-04-25 (×6): 5000 [IU] via SUBCUTANEOUS
  Filled 2012-04-23 (×8): qty 1

## 2012-04-23 MED ORDER — SODIUM CHLORIDE 0.9 % IV SOLN
INTRAVENOUS | Status: AC
  Administered 2012-04-24: 03:00:00 via INTRAVENOUS

## 2012-04-23 MED ORDER — ATORVASTATIN CALCIUM 80 MG PO TABS
80.0000 mg | ORAL_TABLET | Freq: Every day | ORAL | Status: DC
  Administered 2012-04-24: 80 mg via ORAL
  Filled 2012-04-23 (×2): qty 1

## 2012-04-23 MED ORDER — PIPERACILLIN-TAZOBACTAM 3.375 G IVPB 30 MIN
3.3750 g | Freq: Once | INTRAVENOUS | Status: AC
  Administered 2012-04-23: 3.375 g via INTRAVENOUS
  Filled 2012-04-23: qty 50

## 2012-04-23 MED ORDER — VANCOMYCIN HCL IN DEXTROSE 1-5 GM/200ML-% IV SOLN
1000.0000 mg | Freq: Two times a day (BID) | INTRAVENOUS | Status: DC
  Filled 2012-04-23 (×2): qty 200

## 2012-04-23 NOTE — ED Notes (Signed)
Patient transported to CT 

## 2012-04-23 NOTE — ED Notes (Signed)
Neurology in room at this time.

## 2012-04-23 NOTE — Consult Note (Signed)
Referring Physician: Powers    Chief Complaint: Stroke  HPI:                                                                                                                                         [name] is an 62 y.o. female with multiple stroke risk factors. Patient was picked up by her daughter for her cardiology office visit.  When they arrived at the cardiology office the nurse could not find a pulse. After a little while a systolic BP was noted to be 70. He cardiologist cut some of her medications in half and they left the office. After leaving the office she was noted to be altered and not moving her left arm and leg well. After arriving in the ER and blood pressure was elevated from 68/38 to asystolically in the 90's her symptoms resolved. She is now back to her baseline.   Date last known well: 4.16.14 Time last known well: 0915 tPA Given: No: out of window NIHSS: 0  Past Medical History  Diagnosis Date  . Coronary artery disease     s/p CABG 2007  . Sleep apnea     uses O2 at night  . Ischemic cardiomyopathy     EF 30-35%, s/p ICD 4/08 by Dr Amil Amen  . Diabetes mellitus     type II  . Hypertension   . Obesity   . CHF (congestive heart failure)   . Oxygen dependent 2 L    Past Surgical History  Procedure Laterality Date  . Cardiac catheterization    . Cardiac defibrillator placement  4/08    by Dr Amil Amen ([facility]T)  . Coronary artery bypass graft  2007  . Implantable cardioverter defibrillator implant      Family History  Problem Relation Age of Onset  . Diabetes Mother 37    died - HTN  . Other      No early family hx of CAD   Social History:  reports that she has never smoked. She does not have any smokeless tobacco history on file. She reports that she does not drink alcohol or use illicit drugs.  Allergies:  Allergies  Allergen Reactions  . Metformin And Related Other (See Comments)    Leg pain    Medications:  Current Facility-Administered Medications  Medication Dose Route Frequency Provider Last Rate Last Dose  . piperacillin-tazobactam (ZOSYN) IVPB 3.375 g  3.375 g Intravenous Q8H Marwan T Powers, MD      . vancomycin (VANCOCIN) 2,000 mg in sodium chloride 0.9 % 500 mL IVPB  2,000 mg Intravenous Once Tobin Chad, MD 250 mL/hr at 04/23/12 1455 2,000 mg at 04/23/12 1455  . vancomycin (VANCOCIN) IVPB 1000 mg/200 mL premix  1,000 mg Intravenous Q12H Tobin Chad, MD       Current Outpatient Prescriptions  Medication Sig Dispense Refill  . aspirin 325 MG tablet Take 325 mg by mouth daily.        Marland Kitchen atorvastatin (LIPITOR) 20 MG tablet Take 80 mg by mouth daily.       . carvedilol (COREG) 12.5 MG tablet Take 25 mg by mouth 2 (two) times daily with a meal.       . ferrous sulfate 325 (65 FE) MG tablet Take 325 mg by mouth daily with breakfast.      . furosemide (LASIX) 40 MG tablet Take 80 mg by mouth 2 (two) times daily.       . insulin glargine (LANTUS) 100 UNIT/ML injection Inject 90 Units into the skin at bedtime.       . insulin lispro (HUMALOG) 100 UNIT/ML injection Inject 60 Units into the skin 3 (three) times daily after meals.       . isosorbide mononitrate (IMDUR) 60 MG 24 hr tablet Take 60 mg by mouth daily.      Marland Kitchen lisinopril (PRINIVIL,ZESTRIL) 5 MG tablet Take 2 tablets (10 mg total) by mouth daily.  30 tablet  0  . spironolactone (ALDACTONE) 25 MG tablet Take 25 mg by mouth daily.           ROS:                                                                                                                                       History obtained from the patient  General ROS: negative for - chills, fatigue, fever, night sweats, weight gain or weight loss Psychological ROS: negative for - behavioral disorder, hallucinations, memory difficulties, mood swings or suicidal ideation Ophthalmic ROS: negative  for - blurry vision, double vision, eye pain or loss of vision ENT ROS: negative for - epistaxis, nasal discharge, oral lesions, sore throat, tinnitus or vertigo Allergy and Immunology ROS: negative for - hives or itchy/watery eyes Hematological and Lymphatic ROS: negative for - bleeding problems, bruising or swollen lymph nodes Endocrine ROS: negative for - galactorrhea, hair pattern changes, polydipsia/polyuria or temperature intolerance Respiratory ROS: negative for - cough, hemoptysis, shortness of breath or wheezing Cardiovascular ROS: negative for - chest pain, dyspnea on exertion, edema or irregular heartbeat Gastrointestinal ROS: negative for - abdominal pain, diarrhea, hematemesis, nausea/vomiting or stool incontinence Genito-Urinary ROS: negative for - dysuria, hematuria, incontinence or urinary frequency/urgency  Musculoskeletal ROS: negative for - joint swelling or muscular weakness Neurological ROS: as noted in HPI Dermatological ROS: negative for rash and skin lesion changes  Neurologic Examination:                                                                                                      Blood pressure 88/48, pulse 57, temperature 97.9 F (36.6 C), temperature source Oral, resp. rate 24, SpO2 99.00%.  Mental Status: Alert, oriented, thought content appropriate.  Speech fluent without evidence of aphasia.  Able to follow 3 step commands without difficulty. Cranial Nerves: II: Discs flat bilaterally; Visual fields grossly normal, pupils equal, round, reactive to light and accommodation III,IV, VI: ptosis not present, extra-ocular motions intact bilaterally V,VII: smile symmetric, facial light touch sensation normal bilaterally VIII: hearing normal bilaterally IX,X: gag reflex present XI: bilateral shoulder shrug XII: midline tongue extension Motor: Right : Upper extremity   5/5    Left:     Upper extremity   5/5  Lower extremity   5/5     Lower extremity   5/5 Tone  and bulk:normal tone throughout; no atrophy noted Sensory: Pinprick and light touch intact throughout, bilaterally with decreased temperature and vibration from ankle to foot.  Deep Tendon Reflexes: 2+ and symmetric throughout UE with 1+ KJ and no AJ Plantars: Right: downgoing   Left: downgoing Cerebellar: normal finger-to-nose,  normal heel-to-shin test CV: pulses palpable throughout    Results for orders placed during the hospital encounter of 04/23/12 (from the past 48 hour(s))  GLUCOSE, CAPILLARY     Status: Abnormal   Collection Time    04/23/12 12:30 PM      Result Value Range   Glucose-Capillary 252 (*) 70 - 99 mg/dL   Comment 1 Documented in Chart     Comment 2 Notify RN    CBC WITH DIFFERENTIAL     Status: None   Collection Time    04/23/12 12:33 PM      Result Value Range   WBC 6.3  4.0 - 10.5 K/uL   RBC 4.53  3.87 - 5.11 MIL/uL   Hemoglobin 13.7  12.0 - 15.0 g/dL   HCT 96.0  45.4 - 09.8 %   MCV 88.7  78.0 - 100.0 fL   MCH 30.2  26.0 - 34.0 pg   MCHC 34.1  30.0 - 36.0 g/dL   RDW 11.9  14.7 - 82.9 %   Platelets 176  150 - 400 K/uL   Neutrophils Relative 51  43 - 77 %   Neutro Abs 3.2  1.7 - 7.7 K/uL   Lymphocytes Relative 38  12 - 46 %   Lymphs Abs 2.4  0.7 - 4.0 K/uL   Monocytes Relative 9  3 - 12 %   Monocytes Absolute 0.5  0.1 - 1.0 K/uL   Eosinophils Relative 2  0 - 5 %   Eosinophils Absolute 0.1  0.0 - 0.7 K/uL   Basophils Relative 1  0 - 1 %   Basophils Absolute 0.0  0.0 - 0.1 K/uL  COMPREHENSIVE  METABOLIC PANEL     Status: Abnormal   Collection Time    04/23/12 12:33 PM      Result Value Range   Sodium 136  135 - 145 mEq/L   Potassium 4.9  3.5 - 5.1 mEq/L   Chloride 99  96 - 112 mEq/L   CO2 28  19 - 32 mEq/L   Glucose, Bld 263 (*) 70 - 99 mg/dL   BUN 19  6 - 23 mg/dL   Creatinine, Ser 6.04 (*) 0.50 - 1.10 mg/dL   Calcium 9.9  8.4 - 54.0 mg/dL   Total Protein 7.8  6.0 - 8.3 g/dL   Albumin 3.4 (*) 3.5 - 5.2 g/dL   AST 15  0 - 37 U/L   ALT 15  0 -  35 U/L   Alkaline Phosphatase 94  39 - 117 U/L   Total Bilirubin 0.6  0.3 - 1.2 mg/dL   GFR calc non Af Amer 37 (*) >90 mL/min   GFR calc Af Amer 43 (*) >90 mL/min   Comment:            The eGFR has been calculated     using the CKD EPI equation.     This calculation has not been     validated in all clinical     situations.     eGFR's persistently     <90 mL/min signify     possible Chronic Kidney Disease.  PROTIME-INR     Status: None   Collection Time    04/23/12 12:33 PM      Result Value Range   Prothrombin Time 13.6  11.6 - 15.2 seconds   INR 1.05  0.00 - 1.49  APTT     Status: None   Collection Time    04/23/12 12:33 PM      Result Value Range   aPTT 27  24 - 37 seconds  POCT I-STAT 3, BLOOD GAS (G3+)     Status: Abnormal   Collection Time    04/23/12 12:51 PM      Result Value Range   pH, Arterial 7.405  7.350 - 7.450   pCO2 arterial 44.0  35.0 - 45.0 mmHg   pO2, Arterial 62.0 (*) 80.0 - 100.0 mmHg   Bicarbonate 27.6 (*) 20.0 - 24.0 mEq/L   TCO2 29  0 - 100 mmol/L   O2 Saturation 91.0     Acid-Base Excess 2.0  0.0 - 2.0 mmol/L   Collection site RADIAL, ALLEN'S TEST ACCEPTABLE     Drawn by Operator     Sample type ARTERIAL    POCT I-STAT TROPONIN I     Status: None   Collection Time    04/23/12 12:53 PM      Result Value Range   Troponin i, poc 0.00  0.00 - 0.08 ng/mL   Comment 3            Comment: Due to the release kinetics of cTnI,     a negative result within the first hours     of the onset of symptoms does not rule out     myocardial infarction with certainty.     If myocardial infarction is still suspected,     repeat the test at appropriate intervals.  LACTIC ACID, PLASMA     Status: Abnormal   Collection Time    04/23/12  1:37 PM      Result Value Range   Lactic Acid, Venous 2.3 (*) 0.5 -  2.2 mmol/L  CK TOTAL AND CKMB     Status: None   Collection Time    04/23/12  1:37 PM      Result Value Range   Total CK 59  7 - 177 U/L   CK, MB 1.6   0.3 - 4.0 ng/mL   Relative Index RELATIVE INDEX IS INVALID  0.0 - 2.5   Comment: WHEN CK < 100 U/L             URINALYSIS, ROUTINE W REFLEX MICROSCOPIC     Status: Abnormal   Collection Time    04/23/12  1:55 PM      Result Value Range   Color, Urine YELLOW  YELLOW   APPearance CLOUDY (*) CLEAR   Specific Gravity, Urine 1.020  1.005 - 1.030   pH 6.0  5.0 - 8.0   Glucose, UA NEGATIVE  NEGATIVE mg/dL   Hgb urine dipstick NEGATIVE  NEGATIVE   Bilirubin Urine NEGATIVE  NEGATIVE   Ketones, ur NEGATIVE  NEGATIVE mg/dL   Protein, ur NEGATIVE  NEGATIVE mg/dL   Urobilinogen, UA 0.2  0.0 - 1.0 mg/dL   Nitrite NEGATIVE  NEGATIVE   Leukocytes, UA MODERATE (*) NEGATIVE  URINE MICROSCOPIC-ADD ON     Status: Abnormal   Collection Time    04/23/12  1:55 PM      Result Value Range   Squamous Epithelial / LPF FEW (*) RARE   WBC, UA 3-6  <3 WBC/hpf   Bacteria, UA FEW (*) RARE   Ct Head Wo Contrast  04/23/2012  *RADIOLOGY REPORT*  Clinical Data: Altered mental status.  CT HEAD WITHOUT CONTRAST  Technique:  Contiguous axial images were obtained from the base of the skull through the vertex without contrast.  Comparison: None.  Findings: There is no evidence for acute hemorrhage, hydrocephalus, mass lesion, or abnormal extra-axial fluid collection.  No definite CT evidence for acute infarction.  Lacunar infarcts are seen in the basal ganglia bilaterally.  Hypo attenuation in the deep white matter of the right frontal lobe is nonspecific. Diffuse loss of parenchymal volume is consistent with atrophy.  The visualized paranasal sinuses and mastoid air cells are clear.  IMPRESSION: No acute intracranial abnormality.  Bilateral lacunar infarctions within the basal ganglia.  Hypo attenuation in the deep right frontal white matter is probably related to remote ischemia or asymmetric chronic small vessel ischemic disease.  Acute to subacute ischemia cannot be completely excluded.  MRI could be performed to further  evaluate as clinically warranted.   Original Report Authenticated By: Kennith Center, M.D.    Dg Chest Port 1 View  04/23/2012  *RADIOLOGY REPORT*  Clinical Data: Altered mental status  PORTABLE CHEST - 1 VIEW  Comparison: 01/30/2012  Findings: Cardiomegaly again noted.  Status post CABG.  Dual lead cardiac pacemaker is unchanged in position.  Mild interstitial prominence bilaterally without convincing pulmonary edema.  Stable linear atelectasis or scarring in the lingula and right lower lobe. No segmental infiltrate or pleural effusion.  IMPRESSION: Cardiomegaly.  Status post CABG.  Mild interstitial prominence bilaterally without pulmonary edema.  No segmental infiltrate.   Original Report Authenticated By: Natasha Mead, M.D.     Assessment and plan discussed with with attending physician and they are in agreement.    Felicie Morn PA-C [facility] [phone]  04/23/2012, 3:28 PM  I have seen and evaluated the patient. I have reviewed the above note and made appropriate changes.     Assessment: 62 y.o. female  with multiple stroke risk factors who suffered a prolonged period of hypotension while in the Cardiology office and subsequently had associated left arm and leg weakness. CT head showed hypodensity in the watershed distribution on the right.  Patient likely has suffered right frontal infarct as result of hypoperfusion in the past. Given patients  CT findings and worsening deficits with hypotension, I am concerned for a stenosis causing her to be more affected on the left side than right. Would obtain CT angiogram of head and neck when renal function allowed.  Stroke Risk Factors - carotid stenosis, diabetes mellitus, hyperlipidemia, hypertension and smoking  Plan: 1. HgbA1c, fasting lipid panel 2. CT angiogram head and neck when creatinine allows.  3. PT consult, OT consult, Speech consult 4. Echocardiogram 5. Carotid dopplers 6. Prophylactic therapy-Antiplatelet med: Aspirin -  dose 325 mg daily 7. Risk factor modification 8. Telemetry monitoring 9. Frequent neuro checks  Ritta Slot, MD [facility] [phone]  If 7pm- 7am, please page neurology on call at [phone].

## 2012-04-23 NOTE — Progress Notes (Signed)
ANTIBIOTIC CONSULT NOTE - INITIAL  Pharmacy Consult for Vancomycin, Zosyn Indication: sepsis  Allergies  Allergen Reactions  . Metformin And Related Other (See Comments)    Leg pain   Weight = 121 kg   Medical History: Past Medical History  Diagnosis Date  . Coronary artery disease     s/p CABG 2007  . Sleep apnea     uses O2 at night  . Ischemic cardiomyopathy     EF 30-35%, s/p ICD 4/08 by Dr Amil Amen  . Diabetes mellitus     type II  . Hypertension   . Obesity   . CHF (congestive heart failure)   . Oxygen dependent 2 L   Assessment: 62 year old admitted with low BP and AMS.  Starting broad spectrum antibiotics for possible sepsis.  Baseline labs still pending  Goal of Therapy:  Vancomycin trough level 15-20 mcg/ml Appropriate Zosyn dosing  Plan:  1) Zosyn 3.375 grams iv Q 8 hours (4 hr infusion) 2) Vancomycin 2000 mg iv x 1 dose now, then 1 Gram iv Q 12 hours 3) Follow up renal function, cultures, progress  Thank you. Okey Regal, PharmD [phone]  Elwin Sleight 04/23/2012,12:48 PM

## 2012-04-23 NOTE — ED Notes (Signed)
[facility] at the bedside.

## 2012-04-23 NOTE — ED Notes (Signed)
RN on 3300 will return phone call

## 2012-04-23 NOTE — ED Provider Notes (Signed)
History     CSN: [id number]  Arrival date & time 04/23/12  1213   First MD Initiated Contact with Patient 04/23/12 1218      Chief Complaint  Patient presents with  . Hypotension  . Altered Mental Status    (Consider location/radiation/quality/duration/timing/severity/associated sxs/prior treatment) HPI Comments: Ms. Brammer had a routine visit with her PMD today but was noted to be altered on arrival to the office.  She has been intermittently somnolent.  She is noted to stare blankly at times but shortly after she will be able to provide confused or disorganized answers to questions.  She denies trauma, pain, or shortness of breath.  Her daughter states she was nauseated yesterday and may have vomited several times.  Patient is a 62 y.o. female presenting with altered mental status. The history is provided by the patient. The history is limited by the condition of the patient (pt is altered and provides confused responses to all questions).  Altered Mental Status This is a new problem. The current episode started 1 to 2 hours ago. The problem occurs constantly. The problem has been gradually worsening. Pertinent negatives include no chest pain, no abdominal pain, no headaches and no shortness of breath. Nothing aggravates the symptoms. Nothing relieves the symptoms. She has tried nothing for the symptoms.    Past Medical History  Diagnosis Date  . Coronary artery disease     s/p CABG 2007  . Sleep apnea     uses O2 at night  . Ischemic cardiomyopathy     EF 30-35%, s/p ICD 4/08 by Dr Amil Amen  . Diabetes mellitus     type II  . Hypertension   . Obesity   . CHF (congestive heart failure)   . Oxygen dependent 2 L    Past Surgical History  Procedure Laterality Date  . Cardiac catheterization    . Cardiac defibrillator placement  4/08    by Dr Amil Amen (MDT)  . Coronary artery bypass graft  2007  . Implantable cardioverter defibrillator implant      Family History  Problem  Relation Age of Onset  . Diabetes Mother 43    died - HTN  . Other      No early family hx of CAD    History  Substance Use Topics  . Smoking status: Never Smoker   . Smokeless tobacco: Not on file  . Alcohol Use: No    OB History   Grav Para Term Preterm Abortions TAB SAB Ect Mult Living                  Review of Systems  Unable to perform ROS: Mental status change  Respiratory: Negative for shortness of breath.   Cardiovascular: Negative for chest pain.  Gastrointestinal: Negative for abdominal pain.  Neurological: Negative for headaches.  Psychiatric/Behavioral: Positive for altered mental status.    Allergies  Metformin and related  Home Medications   Current Outpatient Rx  Name  Route  Sig  Dispense  Refill  . aspirin 325 MG tablet   Oral   Take 325 mg by mouth daily.           Marland Kitchen atorvastatin (LIPITOR) 20 MG tablet   Oral   Take 80 mg by mouth daily.          . carvedilol (COREG) 12.5 MG tablet   Oral   Take 25 mg by mouth 2 (two) times daily with a meal.          .  ferrous sulfate 325 (65 FE) MG tablet   Oral   Take 325 mg by mouth daily with breakfast.         . furosemide (LASIX) 40 MG tablet      80 mg 2 (two) times daily.         . insulin glargine (LANTUS) 100 UNIT/ML injection   Subcutaneous   Inject 90 Units into the skin at bedtime.          . insulin lispro (HUMALOG) 100 UNIT/ML injection   Subcutaneous   Inject 60 Units into the skin 3 (three) times daily after meals.          . isosorbide mononitrate (IMDUR) 60 MG 24 hr tablet   Oral   Take 60 mg by mouth daily.         Marland Kitchen lisinopril (PRINIVIL,ZESTRIL) 5 MG tablet   Oral   Take 2 tablets (10 mg total) by mouth daily.   30 tablet   0   . potassium chloride SA (K-DUR,KLOR-CON) 20 MEQ tablet   Oral   Take 20 mEq by mouth daily.           Marland Kitchen spironolactone (ALDACTONE) 25 MG tablet   Oral   Take 25 mg by mouth daily.             BP 68/38  Temp(Src)  98.2 F (36.8 C) (Oral)  Resp 26  SpO2 94%  Physical Exam  Nursing note and vitals reviewed. Constitutional: She appears well-nourished. She appears lethargic. She appears toxic. She appears ill. No distress. She is not intubated.  Pt is morbidly obese.  HENT:  Head: Normocephalic and atraumatic. Head is without raccoon's eyes, without Battle's sign, without contusion, without laceration, without right periorbital erythema and without left periorbital erythema. No trismus in the jaw.  Right Ear: Hearing, tympanic membrane, external ear and ear canal normal. No mastoid tenderness. No hemotympanum.  Left Ear: Hearing, tympanic membrane, external ear and ear canal normal. No mastoid tenderness. No hemotympanum.  Nose: Nose normal. No mucosal edema, rhinorrhea or sinus tenderness. No epistaxis.  No foreign bodies. Right sinus exhibits no maxillary sinus tenderness and no frontal sinus tenderness. Left sinus exhibits no maxillary sinus tenderness and no frontal sinus tenderness.  Mouth/Throat: Uvula is midline. Mucous membranes are not pale, dry and not cyanotic. No oropharyngeal exudate, posterior oropharyngeal edema or posterior oropharyngeal erythema.  Eyes: Conjunctivae, EOM and lids are normal. Pupils are equal, round, and reactive to light. Right eye exhibits no discharge and no exudate. Left eye exhibits no discharge and no exudate. Right conjunctiva is not injected. Right conjunctiva has no hemorrhage. Left conjunctiva is not injected. Left conjunctiva has no hemorrhage. No scleral icterus. Right eye exhibits normal extraocular motion and no nystagmus. Left eye exhibits normal extraocular motion and no nystagmus. Right pupil is round and reactive. Left pupil is round and reactive. Pupils are equal.  Neck: Trachea normal, normal range of motion, full passive range of motion without pain and phonation normal. Neck supple. Normal carotid pulses and no JVD present. No tracheal tenderness, no spinous  process tenderness and no muscular tenderness present. Carotid bruit is not present. No rigidity. No tracheal deviation, no edema, no erythema and normal range of motion present.  Cardiovascular: Regular rhythm, intact distal pulses and normal pulses.  Bradycardia present.  PMI is not displaced.  Exam reveals distant heart sounds. Exam reveals no decreased pulses.   Murmur heard. Pulmonary/Chest: Effort normal. No accessory muscle usage or  stridor. No apnea, not tachypneic and not bradypneic. She is not intubated. No respiratory distress. She has decreased breath sounds (diffusely). She has no wheezes. She has no rhonchi. She has no rales. She exhibits no tenderness, no bony tenderness, no laceration, no crepitus and no retraction.  Abdominal: Soft. Normal appearance and bowel sounds are normal. She exhibits no mass. There is no tenderness. There is no rigidity, no rebound, no guarding, no CVA tenderness, no tenderness at McBurney's point and negative Murphy's sign. No hernia.  Obese, protuberant.   Musculoskeletal: Normal range of motion. She exhibits no tenderness. Edema: trace.  Neurological: She has normal strength. She appears lethargic. She displays no atrophy and no tremor. No cranial nerve deficit. She exhibits normal muscle tone. She displays no seizure activity. GCS eye subscore is 4. GCS verbal subscore is 4. GCS motor subscore is 5.  Skin: She is not diaphoretic.    ED Course  Procedures (including critical care time)  Labs Reviewed  GLUCOSE, CAPILLARY - Abnormal; Notable for the following:    Glucose-Capillary 252 (*)    All other components within normal limits  URINE CULTURE  CULTURE, BLOOD (ROUTINE X 2)  CULTURE, BLOOD (ROUTINE X 2)  BLOOD GAS, ARTERIAL  CBC WITH DIFFERENTIAL  COMPREHENSIVE METABOLIC PANEL  URINALYSIS, ROUTINE W REFLEX MICROSCOPIC  LACTIC ACID, PLASMA  CK TOTAL AND CKMB  PROTIME-INR  APTT   No results found.   No diagnosis found.   Date: 04/23/2012  @ 1225  Rate: 58 bpm  Rhythm: sinus  QRS Axis: left  Intervals: widened qrs  ST/T Wave abnormalities: nonspecific ST changes  Conduction Disutrbances:left bundle branch block  Narrative Interpretation:   Old EKG Reviewed: LBBB not acute      MDM  Pt presents for evaluation of altered mental status and hypotension.  She is currently altered, note sinus bradycardia with hypotension, nl respiratory rate and effort with depressed O2 sat, NAD.  She denies having any pain.  She has known cardiovascular disease and DM.  Will initiate a work-up including sepsis protocol labs, ABG, coags, cardiac biomarkers, CXR, and CT of the head.  Will bolus IVF and administer broad abx while awaiting initial results.  Although she has a hx of cardiomyopathy and CHF, she is demonstrating evidence of poor cerebral perfusion so IVF bolus has been ordered.  Will continue to follow closely.    1540.  Mental status has improved.  She is now alert and oriented.  Blood pressure has improved however she still has borderline hypotension.  Abx have been completed.  The CBC, CMP, and ABG are relatively unremarkable.  She has no significant anion gap.  Renal insufficiency is not increased above baseline.  She has no leukocytosis or anemia.  Lactic acid is mildly elevated at 2.3.  She has no clinical evidence of peritonitis.  CXR demonstrates no infiltrate, effusion, ptx, or edema.  TheCT scan demonstrates possible subacute lacunar infarctions.  Bedside evaluation performed with the intensivist provider.  Pt will be admitted to a step-down bed by the Team 6 hospitalist.  CRITICAL CARE Performed by: Dana Allan T   Total critical care time: 45  Critical care time was exclusive of separately billable procedures and treating other patients.  Critical care was necessary to treat or prevent imminent or life-threatening deterioration.  Critical care was time spent personally by me on the following activities: development of  treatment plan with patient and/or surrogate as well as nursing, discussions with consultants, evaluation of patient's response to  treatment, examination of patient, obtaining history from patient or surrogate, ordering and performing treatments and interventions, ordering and review of laboratory studies, ordering and review of radiographic studies, pulse oximetry and re-evaluation of patient's condition.       Tobin Chad, MD 04/23/12 [id number]

## 2012-04-23 NOTE — ED Notes (Signed)
Daughter states they were at her doctor earlier today for a check up and her BP was low 72 systolic and she has been having problems where she will respond and then at times she won't but her eyes would stay open. Pt states she checked her blood sugar this morning and it was okay. Denies any problems yesterday.

## 2012-04-23 NOTE — H&P (Signed)
[facility] History and Physical  KENOSHA DOSTER ZOX:[id number] DOB: [birth year] DOA: 04/23/2012  Referring physician: Dr. Lorenso Courier PCP: Egbert Garibaldi, NP   Chief Complaint: AMS, hypotension  HPI: [name] is a 62 y.o. female with pmh as mentioned below who came to ED complaining of AMS/spacing out and hypotension. Patient yesterday with some intermittent abd discomfort and N/V; none today. Patient denies CP, SOB, fever, nausea, abdominal pain or any complaints today. Earlier she was altered, unable to communicate or answer questions appropriately. Symptoms improved/resolved at the moment BP improved. On Arrival to ED she was hypothermic, hypotensive and with elevated lactic acid and UA suggesting UTI. [facility] called to admit patient for further evaluation and treatment.   Review of Systems:  Negative except as mentioned on HPI.   Past Medical History  Diagnosis Date  . Coronary artery disease     s/p CABG 2007  . Sleep apnea     uses O2 at night  . Ischemic cardiomyopathy     EF 30-35%, s/p ICD 4/08 by Dr Amil Amen  . Diabetes mellitus     type II  . Hypertension   . Obesity   . CHF (congestive heart failure)   . Oxygen dependent 2 L   Past Surgical History  Procedure Laterality Date  . Cardiac catheterization    . Cardiac defibrillator placement  4/08    by Dr Amil Amen (MDT)  . Coronary artery bypass graft  2007  . Implantable cardioverter defibrillator implant     Social History:  reports that she has never smoked. She does not have any smokeless tobacco history on file. She reports that she does not drink alcohol or use illicit drugs. Lives with daughter at home.  Allergies  Allergen Reactions  . Metformin And Related Other (See Comments)    Leg pain    Family History  Problem Relation Age of Onset  . Diabetes Mother 22    died - HTN  . Other      No early family hx of CAD     Prior to Admission medications   Medication Sig Start Date End Date  Taking? Authorizing Provider  aspirin 325 MG tablet Take 325 mg by mouth daily.     Yes Historical Provider, MD  atorvastatin (LIPITOR) 20 MG tablet Take 80 mg by mouth daily.    Yes Historical Provider, MD  carvedilol (COREG) 12.5 MG tablet Take 25 mg by mouth 2 (two) times daily with a meal.    Yes Historical Provider, MD  ferrous sulfate 325 (65 FE) MG tablet Take 325 mg by mouth daily with breakfast.   Yes Historical Provider, MD  furosemide (LASIX) 40 MG tablet Take 80 mg by mouth 2 (two) times daily.  03/10/12  Yes Hillis Range, MD  insulin glargine (LANTUS) 100 UNIT/ML injection Inject 90 Units into the skin at bedtime.    Yes Historical Provider, MD  insulin lispro (HUMALOG) 100 UNIT/ML injection Inject 60 Units into the skin 3 (three) times daily after meals.    Yes Historical Provider, MD  isosorbide mononitrate (IMDUR) 60 MG 24 hr tablet Take 60 mg by mouth daily.   Yes Historical Provider, MD  lisinopril (PRINIVIL,ZESTRIL) 5 MG tablet Take 2 tablets (10 mg total) by mouth daily. 02/01/12  Yes Richarda Overlie, MD  spironolactone (ALDACTONE) 25 MG tablet Take 25 mg by mouth daily.     Yes Historical Provider, MD   Physical Exam: Filed Vitals:   04/23/12 1615 04/23/12 1630  04/23/12 1645 04/23/12 1649  BP: 94/43 90/49 94/82    Pulse: 55 54 52   Temp: 97.9 F (36.6 C) 97.7 F (36.5 C) 97.7 F (36.5 C) 97.7 F (36.5 C)  TempSrc:      Resp: 25 27 16    SpO2: 99% 100% 99%      General:  AAOX3, cooperative to examination, afebrile and in no acute distress  Eyes: PERRL, EOMI, no icterus, no nystagmus  ENT: no erythema, no exudates, mild dryness on her MM, no teeth, denture in place. No ears or nostrils drainage  Neck: supple, no thyromegaly, no bruits  Cardiovascular: bradycardia, no rubs or gallops, positye SEM  Respiratory: no wheezing, no rhonchi, no crackles; good air movement.  Abdomen: soft, NT, ND, positive BS  Skin: no rash or petechiae  Musculoskeletal: no joint  swelling or erythema; no edema or cyanosis  Psychiatric: appropriate mood, no hallucinations  Neurologic: AA)X3, CN intact, no focal motro or sensory deficit; MS 4/5 bilaterally and simetrically; normal finger to nose.  Labs on Admission:  Basic Metabolic Panel:  Recent Labs Lab 04/23/12 1233  NA 136  K 4.9  CL 99  CO2 28  GLUCOSE 263*  BUN 19  CREATININE 1.47*  CALCIUM 9.9   Liver Function Tests:  Recent Labs Lab 04/23/12 1233  AST 15  ALT 15  ALKPHOS 94  BILITOT 0.6  PROT 7.8  ALBUMIN 3.4*   CBC:  Recent Labs Lab 04/23/12 1233  WBC 6.3  NEUTROABS 3.2  HGB 13.7  HCT 40.2  MCV 88.7  PLT 176   Cardiac Enzymes:  Recent Labs Lab 04/23/12 1337  CKTOTAL 59  CKMB 1.6    BNP (last 3 results)  Recent Labs  10/25/11 0651 01/30/12 2336  PROBNP 374.4* 893.8*   CBG:  Recent Labs Lab 04/23/12 1230  GLUCAP 252*    Radiological Exams on Admission: Ct Head Wo Contrast  04/23/2012  *RADIOLOGY REPORT*  Clinical Data: Altered mental status.  CT HEAD WITHOUT CONTRAST  Technique:  Contiguous axial images were obtained from the base of the skull through the vertex without contrast.  Comparison: None.  Findings: There is no evidence for acute hemorrhage, hydrocephalus, mass lesion, or abnormal extra-axial fluid collection.  No definite CT evidence for acute infarction.  Lacunar infarcts are seen in the basal ganglia bilaterally.  Hypo attenuation in the deep white matter of the right frontal lobe is nonspecific. Diffuse loss of parenchymal volume is consistent with atrophy.  The visualized paranasal sinuses and mastoid air cells are clear.  IMPRESSION: No acute intracranial abnormality.  Bilateral lacunar infarctions within the basal ganglia.  Hypo attenuation in the deep right frontal white matter is probably related to remote ischemia or asymmetric chronic small vessel ischemic disease.  Acute to subacute ischemia cannot be completely excluded.  MRI could be  performed to further evaluate as clinically warranted.   Original Report Authenticated By: Kennith Center, M.D.    Dg Chest Port 1 View  04/23/2012  *RADIOLOGY REPORT*  Clinical Data: Altered mental status  PORTABLE CHEST - 1 VIEW  Comparison: 01/30/2012  Findings: Cardiomegaly again noted.  Status post CABG.  Dual lead cardiac pacemaker is unchanged in position.  Mild interstitial prominence bilaterally without convincing pulmonary edema.  Stable linear atelectasis or scarring in the lingula and right lower lobe. No segmental infiltrate or pleural effusion.  IMPRESSION: Cardiomegaly.  Status post CABG.  Mild interstitial prominence bilaterally without pulmonary edema.  No segmental infiltrate.   Original Report Authenticated  By: Natasha Mead, M.D.     EKG: Rate: 58 bpm  Rhythm: sinus  QRS Axis: left  Intervals: widened qrs  ST/T Wave abnormalities: nonspecific ST changes  Conduction Disutrbances:left bundle branch block  Narrative Interpretation:  Old EKG Reviewed: LBBB not acute   Assessment/Plan 1-SIRS (systemic inflammatory response syndrome): most likely secondary to UTI as suggested by UA. -will admit to stepdown -start empirically tx with zosyn and follow urine and blood cx's -patient has not been in the hospital recently and is not complaining of cough; also CXR is not demonstrating frank infiltrates. Will hold on vac or ant coverage for lungs infection. -IVF resuscitation -follow lactic acid in am  2-Ischemic cardiomyopathy: with EF25-30%; will be careful with IVF's. -follow close I's and O's -cycle cardiac enxymes (as patient complaining of abdominal pain and vomiting and she is diabetic) -check 2-D echo -continue asa and statins -once BP stable will resume b-blocker, nitrates and aldactone and ACE/ARB (which are also on hold now due to renal failure).  3-CAD (coronary artery disease): no CP, but patient reprots some SOB and abdominal pain. ?? Angina equivalent in patient with  multiple risk factor. -cycle CE'z, check 2-D echo -continue ASA; once BP stable resume b-blocker -continue statins  4-HTN with Hypotension: due to dehydration and continue use of antihypertensive drugs. -as mentioned above will hydrate, slowly restart meds when BP stable.  5-Acute renal failure:most likely due to continue use on nephrotoxic agents in presence of dehydration; also decrease perfusion with hypotension and presumed UTI. -empiric abx's -follow urine cx -hold nephrotoxic agents and antihypertensive drugs for now -IVF's  6-UTI (lower urinary tract infection): follow cx's and continue zosyn empirically  7-Toxic/metabolic Encephalopathy: due to UTI and hypoperfusion with hypotension. Also TIA due to hypotension is in the differential. -CT showing old strokes -will treat infection as mentioned above -continue ASA and statins -IVF's -no MRI due to pacemaker implantation -CT-angio head an neck after renal function improved?? -neurology has been consulted and will follow rec's.  8-HLD (hyperlipidemia): continue statins. Will check lipid panel  9-Diabetes: continue lantus and SSI; low carb diet. Will check A1C.  DVT: heparin.   Neurology: Dr. Petra Kuba  Code Status: Full Family Communication: daughter at bedside Disposition Plan: Admit to inpatient, stepdown and LOS >2 midnights  Time spent: >30 minutes  Nycole Kawahara [facility] Pager [phone]  If 7PM-7AM, please contact night-coverage [email or web address] Password [facility] 04/23/2012, 4:55 PM

## 2012-04-23 NOTE — ED Notes (Signed)
Portable chest xray being completed.  

## 2012-04-23 NOTE — Progress Notes (Signed)
Pt admitted from ED, VSS, oriented to unit and routines, pain management and safety discussed, family at bedside, both verbalize understanding

## 2012-04-24 ENCOUNTER — Inpatient Hospital Stay (HOSPITAL_COMMUNITY): Payer: Medicaid Other

## 2012-04-24 ENCOUNTER — Encounter (HOSPITAL_COMMUNITY): Payer: Self-pay | Admitting: Radiology

## 2012-04-24 DIAGNOSIS — N390 Urinary tract infection, site not specified: Secondary | ICD-10-CM

## 2012-04-24 DIAGNOSIS — I6350 Cerebral infarction due to unspecified occlusion or stenosis of unspecified cerebral artery: Secondary | ICD-10-CM

## 2012-04-24 LAB — CBC
HCT: 36.9 % (ref 36.0–46.0)
Hemoglobin: 12.3 g/dL (ref 12.0–15.0)
RBC: 4.13 MIL/uL (ref 3.87–5.11)
RDW: 14.5 % (ref 11.5–15.5)
WBC: 7.6 10*3/uL (ref 4.0–10.5)

## 2012-04-24 LAB — GLUCOSE, CAPILLARY
Glucose-Capillary: 164 mg/dL — ABNORMAL HIGH (ref 70–99)
Glucose-Capillary: 236 mg/dL — ABNORMAL HIGH (ref 70–99)
Glucose-Capillary: 245 mg/dL — ABNORMAL HIGH (ref 70–99)

## 2012-04-24 LAB — BASIC METABOLIC PANEL
CO2: 30 mEq/L (ref 19–32)
Chloride: 103 mEq/L (ref 96–112)
GFR calc Af Amer: 55 mL/min — ABNORMAL LOW (ref >90)
Potassium: 4.6 mEq/L (ref 3.5–5.1)

## 2012-04-24 LAB — HEMOGLOBIN A1C: Mean Plasma Glucose: 189 mg/dL — ABNORMAL HIGH (ref <117)

## 2012-04-24 LAB — URINE CULTURE: Colony Count: NO GROWTH

## 2012-04-24 LAB — TSH: TSH: 1.07 u[IU]/mL (ref 0.350–4.500)

## 2012-04-24 MED ORDER — IOHEXOL 350 MG/ML SOLN
100.0000 mL | Freq: Once | INTRAVENOUS | Status: AC | PRN
  Administered 2012-04-24: 50 mL via INTRAVENOUS

## 2012-04-24 MED ORDER — DEXTROSE 5 % IV SOLN
1.0000 g | INTRAVENOUS | Status: DC
  Administered 2012-04-24 – 2012-04-25 (×2): 1 g via INTRAVENOUS
  Filled 2012-04-24 (×2): qty 10

## 2012-04-24 NOTE — Progress Notes (Signed)
Report called to Okey Regal, receiving RN on  6 north. VSS. Transferred to 6 north bed 16. Called patient's family member with new room number.  [facility]

## 2012-04-24 NOTE — Progress Notes (Signed)
Stroke Team Progress Note  HISTORY [name] is an 62 y.o. female with multiple stroke risk factors. Patient was picked up by her daughter for her cardiology office visit 04/23/2012. When they arrived at the cardiology office the nurse could not find a pulse. After a little while a systolic BP was noted to be 70. He cardiologist cut some of her medications in half and they left the office. After leaving the office she was noted to be altered and not moving her left arm and leg well. After arriving in the ER and blood pressure was elevated from 68/38 to asystolically in the 90's her symptoms resolved. She is now back to her baseline. Patient was not a TPA candidate secondary to delay in arrival, NIHSS 0. She was admitted to the neuro ICU for further evaluation and treatment.  SUBJECTIVE No family is at the bedside.  Overall she feels her condition is completely resolved.   OBJECTIVE Most recent Vital Signs: Filed Vitals:   04/23/12 2000 04/24/12 0033 04/24/12 0443 04/24/12 0750  BP: 91/42 110/40 109/42   Pulse: 56 56 62   Temp: 98.5 F (36.9 C) 97.6 F (36.4 C) 98 F (36.7 C) 97.9 F (36.6 C)  TempSrc: Oral Oral Oral Oral  Resp: 30 28 27    Height:      Weight:   123 kg (271 lb 2.7 oz)   SpO2: 96% 99% 97%    CBG (last 3)   Recent Labs  04/23/12 1230 04/23/12 2155  GLUCAP 252* 301*    IV Fluid Intake:     MEDICATIONS  . aspirin  325 mg Oral Daily  . atorvastatin  80 mg Oral Daily  . ferrous sulfate  325 mg Oral Q breakfast  . heparin  5,000 Units Subcutaneous Q8H  . insulin aspart  0-15 Units Subcutaneous TID WC  . insulin aspart  4 Units Subcutaneous TID WC  . insulin glargine  60 Units Subcutaneous QHS  . piperacillin-tazobactam (ZOSYN)  IV  3.375 g Intravenous Q8H   PRN:  acetaminophen, acetaminophen, ondansetron (ZOFRAN) IV, ondansetron  Diet:  Cardiac thin liquids Activity:  Up with assistance DVT Prophylaxis:  Heparin 5000 units sq tid  CLINICALLY SIGNIFICANT  STUDIES Basic Metabolic Panel:  Recent Labs Lab 04/23/12 1233 04/23/12 1924 04/24/12 0700  NA 136  --  139  K 4.9  --  4.6  CL 99  --  103  CO2 28  --  30  GLUCOSE 263*  --  167*  BUN 19  --  22  CREATININE 1.47* 1.48* 1.20*  CALCIUM 9.9  --  9.2  MG  --  1.9  --   PHOS  --  3.7  --    Liver Function Tests:  Recent Labs Lab 04/23/12 1233 04/23/12 1924  AST 15 14  ALT 15 14  ALKPHOS 94 81  BILITOT 0.6 0.6  PROT 7.8 6.9  ALBUMIN 3.4* 3.0*   CBC:  Recent Labs Lab 04/23/12 1233 04/23/12 1924 04/24/12 0700  WBC 6.3 7.7 7.6  NEUTROABS 3.2  --   --   HGB 13.7 12.2 12.3  HCT 40.2 36.4 36.9  MCV 88.7 89.4 89.3  PLT 176 157 151   Coagulation:  Recent Labs Lab 04/23/12 1233  LABPROT 13.6  INR 1.05   Cardiac Enzymes:  Recent Labs Lab 04/23/12 1337 04/23/12 1924 04/24/12 0100 04/24/12 0700  CKTOTAL 59  --   --   --   CKMB 1.6  --   --   --  TROPONINI  --  <0.30 <0.30 <0.30   Urinalysis:  Recent Labs Lab 04/23/12 1355  COLORURINE YELLOW  LABSPEC 1.020  PHURINE 6.0  GLUCOSEU NEGATIVE  HGBUR NEGATIVE  BILIRUBINUR NEGATIVE  KETONESUR NEGATIVE  PROTEINUR NEGATIVE  UROBILINOGEN 0.2  NITRITE NEGATIVE  LEUKOCYTESUR MODERATE*   Lipid Panel    Component Value Date/Time   CHOL 192 03/24/2011 0528   TRIG 92 03/24/2011 0528   HDL 34* 03/24/2011 0528   CHOLHDL 5.6 03/24/2011 0528   VLDL 18 03/24/2011 0528   LDLCALC 140* 03/24/2011 0528   HgbA1C  Lab Results  Component Value Date   HGBA1C 8.2* 04/23/2012    Urine Drug Screen:   No results found for this basename: labopia, cocainscrnur, labbenz, amphetmu, thcu, labbarb    Alcohol Level: No results found for this basename: ETH,  in the last 168 hours  CT of the brain  04/23/2012   No acute intracranial abnormality.  Bilateral lacunar infarctions within the basal ganglia.  Hypo attenuation in the deep right frontal white matter is probably related to remote ischemia or asymmetric chronic small vessel  ischemic disease.  Acute to subacute ischemia cannot be completely excluded.   CT angio of the head    MRI/A of the brain  pacer  2D Echocardiogram    Carotid Doppler    CXR  04/24/2012  Mild CHF.    04/23/2012  Cardiomegaly.  Status post CABG.  Mild interstitial prominence bilaterally without pulmonary edema.  No segmental infiltrate.    EKG  normal sinus rhythm.   Therapy Recommendations   Physical Exam  General: The patient is alert and cooperative at the time of the examination. The patient is markedly obese.  Skin: No significant peripheral edema is noted.   Neurologic Exam  Cranial nerves: Facial symmetry is present. Speech is normal, no aphasia or dysarthria is noted. Extraocular movements are full. Visual fields are full.  Motor: The patient has good strength in all 4 extremities.  Coordination: The patient has good finger-nose-finger and heel-to-shin bilaterally.  Gait and station: The gait was not tested. No drift is seen.  Reflexes: Deep tendon reflexes are symmetric.    ASSESSMENT [name] is a 62 y.o. female presenting with left arm and leg with decrease movement in setting of hypotension. Initial imaging shows a possible old stroke. Repeat imaging pending. Suspect right brain watershed infarct due to hypotension. Concern for intra/extracranial atherosclerosis.  On aspirin 325 mg orally every day prior to admission. Now on aspirin 325 mg orally every day for secondary stroke prevention. Patient with no resultant neuro symptoms. Work up underway.  SIRS, thought to be secondary to UTI based on UA results, on empiric vanc and zosyn Hx hypertension, though hypotensive prior to admission. Medication adjustment under way Acute renal failure, Cr 1.2 (improved from 1.47)  Diabetes, HgbA1c 8.2, goal < 7.0 Morbid obesity, Body mass index is 45.12 kg/(m^2). Hyperlipidemia, LDL not ordered, on statin PTA, on statin now, goal LDL < 100 Ischemic cardiomyopathy  w/ EF 30-35% CAD, s/p CABG 2007 OSA ICD 2007  Hospital day # 1  TREATMENT/PLAN  Continue aspirin 325 mg orally every day for secondary stroke prevention.  Complete full stroke workup  Check lipids  CT angio of head  Ok to be OOB from neuro standpoint, therapy evals  Annie Main, MSN, RN, ANVP-BC, ANP-BC, GNP-BC [facility] Pager: [phone] 04/24/2012 8:29 AM  I have personally obtained a history, examined the patient, evaluated imaging results, and  formulated the assessment and plan of care. I agree with the above. Lesly Dukes

## 2012-04-24 NOTE — Progress Notes (Signed)
  Echocardiogram 2D Echocardiogram has been performed.  [name] 04/24/2012, 11:00 AM 

## 2012-04-24 NOTE — Progress Notes (Signed)
[facility]             Progress Note   Subjective: No complaints. Feels well.  Objective: Vital signs in last 24 hours: Temp:  [97.6 F (36.4 C)-98.5 F (36.9 C)] 97.9 F (36.6 C) (04/17 1152) Pulse Rate:  [51-65] 65 (04/17 0750) Resp:  [16-30] 26 (04/17 0750) BP: (85-132)/(40-82) 132/55 mmHg (04/17 1152) SpO2:  [96 %-100 %] 98 % (04/17 0750) Weight:  [123 kg (271 lb 2.7 oz)-123.1 kg (271 lb 6.2 oz)] 123 kg (271 lb 2.7 oz) (04/17 0443) Weight change:  Last BM Date: 04/23/12  Intake/Output from previous day: 04/16 0701 - 04/17 0700 In: 1750 [P.O.:600; I.V.:1050; IV Piggyback:100] Out: 876 [Urine:875; Stool:1] Total I/O In: -  Out: 651 [Urine:650; Stool:1]   Physical Exam: General: Alert, awake, oriented x3, in no acute distress. HEENT: No bruits, no goiter. Heart: Regular rate and rhythm, without murmurs, rubs, gallops. Lungs: Clear to auscultation bilaterally. Abdomen: Soft, nontender, nondistended, positive bowel sounds. Extremities: No clubbing cyanosis or edema with positive pedal pulses. Neuro: Grossly intact, nonfocal.    Lab Results: Basic Metabolic Panel:  Recent Labs  16/10/96 1233 04/23/12 1924 04/24/12 0700  NA 136  --  139  K 4.9  --  4.6  CL 99  --  103  CO2 28  --  30  GLUCOSE 263*  --  167*  BUN 19  --  22  CREATININE 1.47* 1.48* 1.20*  CALCIUM 9.9  --  9.2  MG  --  1.9  --   PHOS  --  3.7  --    Liver Function Tests:  Recent Labs  04/23/12 1233 04/23/12 1924  AST 15 14  ALT 15 14  ALKPHOS 94 81  BILITOT 0.6 0.6  PROT 7.8 6.9  ALBUMIN 3.4* 3.0*   CBC:  Recent Labs  04/23/12 1233 04/23/12 1924 04/24/12 0700  WBC 6.3 7.7 7.6  NEUTROABS 3.2  --   --   HGB 13.7 12.2 12.3  HCT 40.2 36.4 36.9  MCV 88.7 89.4 89.3  PLT 176 157 151   Cardiac Enzymes:  Recent Labs  04/23/12 1337 04/23/12 1924 04/24/12 0100 04/24/12 0700  CKTOTAL 59  --   --   --   CKMB 1.6  --   --   --   TROPONINI  --  <0.30 <0.30  <0.30   CBG:  Recent Labs  04/23/12 1230 04/23/12 2155 04/24/12 0747 04/24/12 1148  GLUCAP 252* 301* 164* 236*   Hemoglobin A1C:  Recent Labs  04/23/12 1924  HGBA1C 8.2*   Thyroid Function Tests:  Recent Labs  04/23/12 1924  TSH 1.070   Coagulation:  Recent Labs  04/23/12 1233  LABPROT 13.6  INR 1.05   Urinalysis:  Recent Labs  04/23/12 1355  COLORURINE YELLOW  LABSPEC 1.020  PHURINE 6.0  GLUCOSEU NEGATIVE  HGBUR NEGATIVE  BILIRUBINUR NEGATIVE  KETONESUR NEGATIVE  PROTEINUR NEGATIVE  UROBILINOGEN 0.2  NITRITE NEGATIVE  LEUKOCYTESUR MODERATE*    Recent Results (from the past 240 hour(s))  CULTURE, BLOOD (ROUTINE X 2)     Status: None   Collection Time    04/23/12  1:40 PM      Result Value Range Status   Specimen Description BLOOD ARM LEFT   Final   Special Requests BOTTLES DRAWN AEROBIC AND ANAEROBIC 10CC   Final   Culture  Setup Time 04/23/2012 23:01   Final   Culture     Final   Value:  BLOOD CULTURE RECEIVED NO GROWTH TO DATE CULTURE WILL BE HELD FOR 5 DAYS BEFORE ISSUING A FINAL NEGATIVE REPORT   Report Status PENDING   Incomplete  CULTURE, BLOOD (ROUTINE X 2)     Status: None   Collection Time    04/23/12  1:45 PM      Result Value Range Status   Specimen Description BLOOD HAND LEFT   Final   Special Requests BOTTLES DRAWN AEROBIC AND ANAEROBIC 10CC   Final   Culture  Setup Time 04/23/2012 23:01   Final   Culture     Final   Value:        BLOOD CULTURE RECEIVED NO GROWTH TO DATE CULTURE WILL BE HELD FOR 5 DAYS BEFORE ISSUING A FINAL NEGATIVE REPORT   Report Status PENDING   Incomplete  URINE CULTURE     Status: None   Collection Time    04/23/12  1:55 PM      Result Value Range Status   Specimen Description URINE, CATHETERIZED   Final   Special Requests NONE   Final   Culture  Setup Time 04/23/2012 14:45   Final   Colony Count NO GROWTH   Final   Culture NO GROWTH   Final   Report Status 04/24/2012 FINAL   Final  MRSA PCR  SCREENING     Status: None   Collection Time    04/23/12  5:31 PM      Result Value Range Status   MRSA by PCR NEGATIVE  NEGATIVE Final   Comment:            The GeneXpert MRSA Assay (FDA     approved for NASAL specimens     only), is one component of a     comprehensive MRSA colonization     surveillance program. It is not     intended to diagnose MRSA     infection nor to guide or     monitor treatment for     MRSA infections.    Studies/Results: Ct Head Wo Contrast  04/23/2012  *RADIOLOGY REPORT*  Clinical Data: Altered mental status.  CT HEAD WITHOUT CONTRAST  Technique:  Contiguous axial images were obtained from the base of the skull through the vertex without contrast.  Comparison: None.  Findings: There is no evidence for acute hemorrhage, hydrocephalus, mass lesion, or abnormal extra-axial fluid collection.  No definite CT evidence for acute infarction.  Lacunar infarcts are seen in the basal ganglia bilaterally.  Hypo attenuation in the deep white matter of the right frontal lobe is nonspecific. Diffuse loss of parenchymal volume is consistent with atrophy.  The visualized paranasal sinuses and mastoid air cells are clear.  IMPRESSION: No acute intracranial abnormality.  Bilateral lacunar infarctions within the basal ganglia.  Hypo attenuation in the deep right frontal white matter is probably related to remote ischemia or asymmetric chronic small vessel ischemic disease.  Acute to subacute ischemia cannot be completely excluded.  MRI could be performed to further evaluate as clinically warranted.   Original Report Authenticated By: Kennith Center, M.D.    Dg Chest Port 1 View  04/24/2012  *RADIOLOGY REPORT*  Clinical Data: Shortness of breath  PORTABLE CHEST - 1 VIEW  Comparison: Portable exam 0606 hours compared to 04/23/2012  Findings: Left subclavian AICD leads project over right atrium and right ventricle. Enlargement of cardiac silhouette post CABG. Pulmonary vascular congestion.  Perihilar infiltrates likely pulmonary edema and CHF. No gross pleural effusion or pneumothorax.  IMPRESSION:  Mild CHF.   Original Report Authenticated By: Ulyses Southward, M.D.    Dg Chest Port 1 View  04/23/2012  *RADIOLOGY REPORT*  Clinical Data: Altered mental status  PORTABLE CHEST - 1 VIEW  Comparison: 01/30/2012  Findings: Cardiomegaly again noted.  Status post CABG.  Dual lead cardiac pacemaker is unchanged in position.  Mild interstitial prominence bilaterally without convincing pulmonary edema.  Stable linear atelectasis or scarring in the lingula and right lower lobe. No segmental infiltrate or pleural effusion.  IMPRESSION: Cardiomegaly.  Status post CABG.  Mild interstitial prominence bilaterally without pulmonary edema.  No segmental infiltrate.   Original Report Authenticated By: Natasha Mead, M.D.     Medications: Scheduled Meds: . aspirin  325 mg Oral Daily  . atorvastatin  80 mg Oral Daily  . cefTRIAXone (ROCEPHIN)  IV  1 g Intravenous Q24H  . ferrous sulfate  325 mg Oral Q breakfast  . heparin  5,000 Units Subcutaneous Q8H  . insulin aspart  0-15 Units Subcutaneous TID WC  . insulin aspart  4 Units Subcutaneous TID WC  . insulin glargine  60 Units Subcutaneous QHS   Continuous Infusions:  PRN Meds:.acetaminophen, acetaminophen, ondansetron (ZOFRAN) IV, ondansetron  Assessment/Plan:  Principal Problem:   SIRS (systemic inflammatory response syndrome) Active Problems:   Ischemic cardiomyopathy   CAD (coronary artery disease)   Hypotension   Acute renal failure   UTI (lower urinary tract infection)   Encephalopathy   HLD (hyperlipidemia)   Diabetes   SIRS -Parameters improving. -Probably related to UTI.  Urinary tract infection -We'll narrow antibiotic from Zosyn to Rocephin pending culture data, she does not have any high risk factors for resistant bacteria.  Chronic systolic congestive heart failure -Known ejection fraction of 25-30%. -Appears compensated at this  time.  Hypotension -Related to systemic inflammatory response syndrome. -Improved. -The blood pressure remained stable in the morning, slowly start to add her home blood pressure medications.  Acute renal failure -Improving.  Disposition -We'll transfer to med surge bed today. -Anticipate discharge home in the next 24-48 hours.   Time spent coordinating care: 35 minutes.   LOS: 1 day   [facility] Pager: [phone] 04/24/2012, 2:27 PM

## 2012-04-24 NOTE — Evaluation (Signed)
Physical Therapy Evaluation Patient Details Name: [name] MRN: [id number] DOB: [birth year] Today's Date: 04/24/2012 Time: 4098-1191 PT Time Calculation (min): 22 min  PT Assessment / Plan / Recommendation Clinical Impression  Patient is a 62 y/o female admitted with SIRS and metabolic encephalopathy due to UTI with h/o ischemic cardiomyopathy and ARF.  She presents with decreased mobility due to generalized weakness and decreased balance and limited activity tolerance and will benefit from skilled PT in the acute setting to maximize independence and allow return home with initial 24 hour assist and HHPT.    PT Assessment  Patient needs continued PT services    Follow Up Recommendations  Home health PT;Supervision/Assistance - 24 hour          Equipment Recommendations  Rolling walker with 5" wheels       Frequency Min 3X/week    Precautions / Restrictions Precautions Precautions: Fall Precaution Comments: O2 dep   Pertinent Vitals/Pain Denies pain      Mobility  Bed Mobility Bed Mobility: Supine to Sit;Sitting - Scoot to Edge of Bed Supine to Sit: 4: Min assist;HOB elevated Sitting - Scoot to [place] of Bed: 4: Min guard Details for Bed Mobility Assistance: assist to lift trunk Transfers Transfers: Sit to Stand;Stand to Sit Sit to Stand: 4: Min guard;From bed;With upper extremity assist Stand to Sit: 4: Min guard;To chair/3-in-1;With armrests;With upper extremity assist Details for Transfer Assistance: cues for hand placement Ambulation/Gait Ambulation/Gait Assistance: 4: Min guard;5: Supervision Ambulation Distance (Feet): 90 Feet Assistive device: Rolling walker Ambulation/Gait Assistance Details: ambulated with O2, slow with turns Gait Pattern: Step-through pattern;Decreased stride length;Wide base of support        PT Diagnosis: Abnormality of gait  PT Problem List: Decreased strength;Decreased activity tolerance;Decreased balance;Decreased  mobility;Decreased knowledge of use of DME PT Treatment Interventions: DME instruction;Gait training;Stair training;Balance training;Functional mobility training;Patient/family education;Therapeutic activities;Therapeutic exercise   PT Goals Acute Rehab PT Goals PT Goal Formulation: With patient/family Time For Goal Achievement: 05/02/12 Potential to Achieve Goals: Good Pt will go Supine/Side to Sit: with supervision PT Goal: Supine/Side to Sit - Progress: Goal set today Pt will go Sit to Supine/Side: with supervision PT Goal: Sit to Supine/Side - Progress: Goal set today Pt will go Sit to Stand: with modified independence PT Goal: Sit to Stand - Progress: Goal set today Pt will go Stand to Sit: with modified independence PT Goal: Stand to Sit - Progress: Goal set today Pt will Stand: with modified independence;with unilateral upper extremity support;3 - 5 min PT Goal: Stand - Progress: Goal set today Pt will Ambulate: 51 - 150 feet;with least restrictive assistive device;with modified independence PT Goal: Ambulate - Progress: Goal set today Pt will Go Up / Down Stairs: 1-2 stairs;with min assist PT Goal: Up/Down Stairs - Progress: Goal set today  Visit Information  Last PT Received On: 04/24/12 Assistance Needed: +1    Subjective Data  Subjective: I don't do much housework. Patient Stated Goal: To return home and be independent as possible   Prior Functioning  Home Living Lives With: Son Available Help at Discharge: Family;Available PRN/intermittently Type of Home: House Home Access: Stairs to enter [employer] of Steps: 2 Entrance Stairs-Rails: None Home Layout: One level Bathroom Shower/Tub: Midwife with back Prior Function Level of Independence: Needs assistance Needs Assistance: Light Housekeeping Light Housekeeping: Maximal Able to Take Stairs?: Yes Driving: Yes Comments:  daughter in room and states can have mom stay with  her few days after released from hospital Communication Communication: No difficulties Dominant Hand: Right    Cognition  Cognition Arousal/Alertness: Awake/alert Behavior During Therapy: WFL for tasks assessed/performed Overall Cognitive Status: Within Functional Limits for tasks assessed    Extremity/Trunk Assessment Right Lower Extremity Assessment RLE ROM/Strength/Tone: WFL for tasks assessed RLE Sensation: Deficits RLE Sensation Deficits: reports tingling in feet Left Lower Extremity Assessment LLE ROM/Strength/Tone: WFL for tasks assessed LLE Sensation: Deficits LLE Sensation Deficits: reports tingling in feet   Balance Balance Balance Assessed: Yes Dynamic Sitting Balance Dynamic Sitting - Balance Support: Bilateral upper extremity supported Dynamic Sitting - Level of Assistance: 5: Stand by assistance Dynamic Sitting - Comments: scooting out to edge of bed  End of Session PT - End of Session Equipment Utilized During Treatment: Gait belt;Oxygen Activity Tolerance: Patient tolerated treatment well Patient left: in chair;with call bell/phone within reach;with family/visitor present  GP     [facility] 04/24/2012, 5:09 PM Sheran Lawless, PT [phone] 04/24/2012

## 2012-04-24 NOTE — Progress Notes (Signed)
Utilization review completed.  

## 2012-04-25 ENCOUNTER — Inpatient Hospital Stay (HOSPITAL_COMMUNITY): Payer: Medicaid Other

## 2012-04-25 LAB — BASIC METABOLIC PANEL
CO2: 31 mEq/L (ref 19–32)
Calcium: 9.7 mg/dL (ref 8.4–10.5)
Creatinine, Ser: 0.87 mg/dL (ref 0.50–1.10)
GFR calc non Af Amer: 70 mL/min — ABNORMAL LOW (ref >90)
Glucose, Bld: 204 mg/dL — ABNORMAL HIGH (ref 70–99)

## 2012-04-25 LAB — CBC
MCH: 30.3 pg (ref 26.0–34.0)
MCV: 89.6 fL (ref 78.0–100.0)
Platelets: 144 10*3/uL — ABNORMAL LOW (ref 150–400)
RDW: 14.3 % (ref 11.5–15.5)

## 2012-04-25 LAB — LIPID PANEL
Total CHOL/HDL Ratio: 4.2 RATIO
VLDL: 23 mg/dL (ref 0–40)

## 2012-04-25 MED ORDER — IOHEXOL 350 MG/ML SOLN
50.0000 mL | Freq: Once | INTRAVENOUS | Status: AC | PRN
  Administered 2012-04-25: 50 mL via INTRAVENOUS

## 2012-04-25 MED ORDER — CARVEDILOL 12.5 MG PO TABS
12.5000 mg | ORAL_TABLET | Freq: Once | ORAL | Status: AC
  Administered 2012-04-25: 12.5 mg via ORAL
  Filled 2012-04-25: qty 1

## 2012-04-25 NOTE — Progress Notes (Signed)
Inpatient Diabetes Program Recommendations  AACE/ADA: New Consensus Statement on Inpatient Glycemic Control (2013)  Target Ranges:  Prepandial:   less than 140 mg/dL      Peak postprandial:   less than 180 mg/dL (1-2 hours)      Critically ill patients:  140 - 180 mg/dL   Results for [name], [name] (MRN [id number]) as of 04/25/2012 09:42  Ref. Range 04/24/2012 07:47 04/24/2012 11:48 04/24/2012 17:01 04/24/2012 21:01 04/25/2012 07:42  Glucose-Capillary Latest Range: 70-99 mg/dL 865 (H) 784 (H) 696 (H) 245 (H) 180 (H)    Inpatient Diabetes Program Recommendations Insulin - Basal: Please consider increasing Lantus to 65 units QHS.  Note: Blood glucose over the past 24 hours has ranged from 164-245 mg/dl.  Please consider increasing Lantus to 65 units QHS to improve glycemic control.  Will continue to follow.  Thanks, Orlando Penner, RN, BSN, CCRN Diabetes Coordinator Inpatient Diabetes Program [phone]

## 2012-04-25 NOTE — Progress Notes (Signed)
[facility]  Patient Status: Active (receiving services up to time of hospitalization)  [facility] is providing the following services: RN  If patient discharges after hours, please call [phone].   [name] 04/25/2012, 4:16 PM

## 2012-04-25 NOTE — Care Management Note (Signed)
  Page 1 of 1   04/25/2012     3:09:18 PM   CARE MANAGEMENT NOTE 04/25/2012  Patient:  [name], [name]   Account Number:  [id number]  Date Initiated:  04/24/2012  Documentation initiated by:  Donn Pierini  Subjective/Objective Assessment:   Pt admitted with SIRS     Action/Plan:   PTA pt lived at home with family- has home 02 and hx of HH with [facility]   Anticipated DC Date:  04/25/2012   Anticipated DC Plan:  HOME W HOME HEALTH SERVICES      DC Planning Services  CM consult      Choice offered to / List presented to:          [facility] arranged  HH-1 RN  HH-2 PT      [facility] agency  [facility]   Status of service:  In process, will continue to follow Medicare Important Message given?   (If response is "NO", the following Medicare IM given date fields will be blank) Date Medicare IM given:   Date Additional Medicare IM given:    Discharge Disposition:    Per UR Regulation:  Reviewed for med. necessity/level of care/duration of stay  If discussed at Long Length of Stay Meetings, dates discussed:    Comments:

## 2012-04-25 NOTE — Progress Notes (Signed)
Discharge instructions reviewed. Medications reviewed. No Rx's given, some home medications continued others discontinued. Pt reports understanding. Pt asked appropriate questions which were answered. Pt ready for discharge.

## 2012-04-25 NOTE — Discharge Summary (Signed)
Physician Discharge Summary  [name] ZOX:[id number] DOB: 30-Jun-1950 DOA: 04/23/2012  PCP: Egbert Garibaldi, NP  Admit date: 04/23/2012 Discharge date: 04/25/2012  Time spent: 35 minutes  Recommendations for Outpatient Follow-up:  1. Recommend outpatient carotid Doppler and 46 months 2. Recommend careful reimplementation of blood pressure medication 3. Recommend A1c in about 3 months 4. Recommend weight management  5. Physical therapy has recommended outpatient home health PT  Discharge Diagnoses:  Principal Problem:   SIRS (systemic inflammatory response syndrome) Active Problems:   Ischemic cardiomyopathy   CAD (coronary artery disease)   Hypotension   Acute renal failure   UTI (lower urinary tract infection)   Encephalopathy   HLD (hyperlipidemia)   Diabetes   Discharge Condition: good  Diet recommendation: heart healthy and diabetic  Filed Weights   04/23/12 1730 04/24/12 0443 04/25/12 0900  Weight: 123.1 kg (271 lb 6.2 oz) 123 kg (271 lb 2.7 oz) 122.925 kg (271 lb)    History of present illness:  62 year old his African American female admitted 04/23/12 with strokelike symptoms. At her cardiologist's office  was t significantly hypotensive and then she was noted not to have a pulse. medication cut in half and she was noted to have altered mental state not moving her left arm or leg well. She was thought that she had some she was admitted to step for work program. Please see below  Hospital Course:  1-SIRS (systemic inflammatory response syndrome): most likely secondary to UTI as suggested by UA.  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 As not thought to be amenable to surgery. She  will need to continue aspirin 325 mg for secondary stroke prevention. She also had 68% stenosis on CTA with posterior circulation stenosis as well  3-Ischemic cardiomyopathy: with EF25-30   4-CAD (coronary artery disease): no CP, but patient reprots some SOB and abdominal pain. ?? Angina equivalent in patient with multiple risk factor.  -cycle CE'z is negative,  2-D echo 4/17 showed reduced EF 25-30% grade 2 diastolic  -continue ASA -continue statins   4-HTN with Hypotension: due to dehydration and continue use of antihypertensive drugs.  -She was volume repleted and her medications were adjusted-she'll be discharged home on the on Coreg 3.5 mg twice a day her Aldactone, lisinopril, Lasix,  have beenheld    5-Acute renal failure:most likely due to continue use on nephrotoxic agents in presence of dehydration; also decrease perfusion with hypotension and presumed UTI.   6-UTI (lower urinary tract infection): follow cx's and continue zosyn empirically   7-Toxic/metabolic Encephalopathy: due to UTI and hypoperfusion with hypotension. Also TIA due to hypotension is in the differential.  Neurology saw patient and determined patient had no further intervention   8-HLD (hyperlipidemia): continue statins. Will check lipid panel   9-Diabetes: A1c 8.2 continue lantus 90 units at home and scheduled and 60 units 3 times a day. She's aware control prescription and SSI; low carb diet. Will check A1C   Procedures:  Echocardiogram 4/17  Consultations:  neurogy  Discharge Exam: Filed Vitals:   04/24/12 2102 04/25/12 0544 04/25/12 0900 04/25/12 1015  BP: 134/59 153/74 145/72   Pulse: 68 74 70   Temp: 98.4 F (36.9 C) 98.1 F (36.7 C) 98.4 F (  36.9 C)   TempSrc: Oral Oral Oral   Resp: 18 18 22    Height:      Weight:   122.925 kg (271 lb)   SpO2: 98% 98% 100% 96%   Well nio acute issues.  Sitting in chair  General: alert pleasant Cardiovascular: s1 s2 no m/r/g Respiratory: clear    Discharge Instructions  Discharge Orders   Future Appointments Provider Department Dept Phone   06/16/2012 11:30 AM [facility] Main Office [place]) [phone]   Future Orders Complete By Expires     Diet - low sodium heart healthy  As directed     Increase activity slowly  As directed         Medication List    STOP taking these medications       furosemide 40 MG tablet  Commonly known as:  LASIX     lisinopril 5 MG tablet  Commonly known as:  PRINIVIL,ZESTRIL     spironolactone 25 MG tablet  Commonly known as:  ALDACTONE      TAKE these medications       aspirin 325 MG tablet  Take 325 mg by mouth daily.     atorvastatin 20 MG tablet  Commonly known as:  LIPITOR  Take 80 mg by mouth daily.     carvedilol 12.5 MG tablet  Commonly known as:  COREG  Take 25 mg by mouth 2 (two) times daily with a meal.     ferrous sulfate 325 (65 FE) MG tablet  Take 325 mg by mouth daily with breakfast.     HUMALOG 100 UNIT/ML injection  Generic drug:  insulin lispro  Inject 60 Units into the skin 3 (three) times daily after meals.     isosorbide mononitrate 60 MG 24 hr tablet  Commonly known as:  IMDUR  Take 60 mg by mouth daily.     LANTUS 100 UNIT/ML injection  Generic drug:  insulin glargine  Inject 90 Units into the skin at bedtime.           Follow-up Information   Follow up with Millsaps, Joelene Millin, NP.   Contact information:   [facility] [address] [place] [address] [phone]       Follow up with Lesly Dukes, MD. Call in 6 weeks.   Contact information:   [address] [place] [address] [phone]        The results of significant diagnostics from this hospitalization (including imaging, microbiology, ancillary and laboratory) are listed below for reference.    Significant Diagnostic Studies: Ct Angio Head W/cm &/or Wo Cm  04/25/2012  *RADIOLOGY REPORT*   Clinical Data:  62 year old female with hypotensive episode.  Left side weakness and altered mental status.  Blood pressure now improved.  CT ANGIOGRAPHY HEAD  Technique:  Multidetector CT imaging of the head was performed using the standard protocol during bolus administration of intravenous contrast.  Multiplanar CT image reconstructions including MIPs were obtained to evaluate the vascular anatomy.  Contrast: 50mL OMNIPAQUE IOHEXOL 350 MG/ML SOLN  Comparison:  Neck CTA 04/24/2012. Head CT without contrast 04/23/2012.  Findings:  No ventriculomegaly. No midline shift, mass effect, or evidence of mass lesion.  No acute intracranial hemorrhage identified.  Chronic bilateral basal ganglia lacunar infarcts. Patchy white matter hypodensity in the right frontal lobe is stable. Stable gray-white matter differentiation throughout the brain.  No evidence of cortically based acute infarction identified.  No abnormal enhancement identified.  Visualized paranasal sinuses and mastoids are clear.  No acute osseous abnormality identified.  Visualized orbits and scalp soft tissues are within normal limits.  Vascular Findings: Major intracranial venous structures are enhancing.  Codominant distal vertebral arteries at the C1 level.  Severe distal left vertebral artery atherosclerosis with high-grade stenosis re-identified.  The left V4 segment remains patent, may be partially reconstituted distally.  Left PICA origin is patent.  Moderate atherosclerosis and stenosis of the distal right vertebral artery.  Patent right PICA.  Vertebrobasilar junction is patent.  The basilar is diminutive.  No focal hemodynamically significant basilar stenosis is identified. AICA and SCA origins are patent.  PCA origins are patent. Posterior communicating arteries are diminutive or absent. Bilateral PCA branches are irregular but remain patent.  Stable distal cervical ICA.  Heavily calcified cavernous ICA segments.  High-grade left ICA anterior genu  stenosis.  Left supraclinoid ICA remains patent.  Moderate to severe right ICA proximal supraclinoid stenosis.  Ophthalmic artery origins are within normal limits.  Carotid termini are patent.  MCA and ACA origins are patent. Mildly dominant left ACA A1 segment.  Anterior communicating artery within normal limits.  Visualized ACA branches are within normal limits.  MCA M1 segments are patent with irregularity but no hemodynamically significant stenosis.  Bilateral MCA bifurcations are patent. Bilateral MCA branches are mildly irregular.  No major branch occlusion is identified.   Review of the MIP images confirms the above findings.  IMPRESSION: 1.  High-grade distal left vertebral artery and bilateral ICA siphon (distal cavernous/supraclinoid level) atherosclerotic stenoses. 2.  No circle Willis vessel occlusion.  Fairly widespread intracranial atherosclerosis elsewhere but no other hemodynamically significant intracranial artery stenosis identified. 3.  Stable CT appearance of the brain.  No evidence of acute or subacute cortically based infarct.   Original Report Authenticated By: Erskine Speed, M.D.    Ct Head Wo Contrast  04/23/2012  *RADIOLOGY REPORT*  Clinical Data: Altered mental status.  CT HEAD WITHOUT CONTRAST  Technique:  Contiguous axial images were obtained from the base of the skull through the vertex without contrast.  Comparison: None.  Findings: There is no evidence for acute hemorrhage, hydrocephalus, mass lesion, or abnormal extra-axial fluid collection.  No definite CT evidence for acute infarction.  Lacunar infarcts are seen in the basal ganglia bilaterally.  Hypo attenuation in the deep white matter of the right frontal lobe is nonspecific. Diffuse loss of parenchymal volume is consistent with atrophy.  The visualized paranasal sinuses and mastoid air cells are clear.  IMPRESSION: No acute intracranial abnormality.  Bilateral lacunar infarctions within the basal ganglia.  Hypo attenuation in  the deep right frontal white matter is probably related to remote ischemia or asymmetric chronic small vessel ischemic disease.  Acute to subacute ischemia cannot be completely excluded.  MRI could be performed to further evaluate as clinically warranted.   Original Report Authenticated By: Kennith Center, M.D.    Ct Angio Neck W/cm &/or Wo/cm  04/24/2012  *RADIOLOGY REPORT*  Clinical Data:  Diabetic hypertensive patient not moving the left arm and leg well.  Defibrillator.  CT ANGIOGRAPHY NECK  Technique:  Multidetector CT imaging of the neck was performed using the standard protocol during bolus administration of intravenous contrast.  Multiplanar CT image reconstructions including MIPs were obtained to evaluate the vascular anatomy. Carotid stenosis measurements (when applicable) are obtained utilizing NASCET criteria, using the distal internal carotid diameter as the denominator.  Contrast: 50mL OMNIPAQUE IOHEXOL 350 MG/ML SOLN  Comparison:  04/23/2012  CT.  Findings:  Large complex left thyroid lesion.  This can be assessed with thyroid ultrasound.  Exophthalmos.  Common origin innominate artery and left common carotid artery.  Plaque at the right carotid bifurcation extending into the proximal right internal carotid artery. Long segment ( spanning over 3 cm) narrowing of proximal right internal carotid artery with maximal 62 % diameter stenosis.  Right internal carotid artery cavernous segment calcification mild to moderate narrowing.  Ectatic left common carotid artery.  Calcification left carotid bifurcation without hemodynamically significant stenosis.  Left internal carotid artery cavernous segment plaque with mild to moderate narrowing.  Evaluation of the proximal right vertebral artery is limited by streak artifact from the venous contrast. Vertical cervical segment right vertebral artery mild narrowing.  Mild to moderate narrowing of the intracranial aspect of the right vertebral artery with tandem  stenoses.  Mild narrowing left subclavian artery.  Mild narrowing proximal left vertebral artery.  Slight irregularity along the vertical segment of the left vertebral artery.  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  Moderate narrowing and irregularity of the basilar artery.  Limited evaluation of intracranial structures without abnormal enhancement or large infarct.  Hemispheres not evaluated present exam.   Review of the MIP images confirms the above findings.  IMPRESSION: Plaque at the right carotid bifurcation extending into the proximal right internal carotid artery. Long segment (spanning over 3 cm) narrowing of proximal right internal carotid artery with maximal 62 % diameter stenosis.  Right internal carotid artery cavernous segment calcification with mild to moderate narrowing.  Calcification left carotid bifurcation without hemodynamically significant stenosis.  Left internal carotid artery cavernous segment plaque with mild to moderate narrowing.  Mild narrowing proximal left vertebral artery.  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  Evaluation of the proximal right vertebral artery is limited by streak artifact.  Mild to moderate narrowing of the intracranial aspect of the right vertebral artery with tandem stenoses.  Moderate narrowing and irregularity of the basilar artery.   Original Report Authenticated By: Lacy Duverney, M.D.    Dg Chest Port 1 View  04/24/2012  *RADIOLOGY REPORT*  Clinical Data: Shortness of breath  PORTABLE CHEST - 1 VIEW  Comparison: Portable exam 0606 hours compared to 04/23/2012  Findings: Left subclavian AICD leads project over right atrium and right ventricle. Enlargement of cardiac silhouette post CABG. Pulmonary vascular congestion. Perihilar infiltrates likely pulmonary edema and CHF. No gross pleural effusion or pneumothorax.  IMPRESSION: Mild CHF.    Original Report Authenticated By: Ulyses Southward, M.D.    Dg Chest Port 1 View  04/23/2012  *RADIOLOGY REPORT*  Clinical Data: Altered mental status  PORTABLE CHEST - 1 VIEW  Comparison: 01/30/2012  Findings: Cardiomegaly again noted.  Status post CABG.  Dual lead cardiac pacemaker is unchanged in position.  Mild interstitial prominence bilaterally without convincing pulmonary edema.  Stable linear atelectasis or scarring in the lingula and right lower lobe. No segmental infiltrate or pleural effusion.  IMPRESSION: Cardiomegaly.  Status post CABG.  Mild interstitial prominence bilaterally without pulmonary edema.  No segmental infiltrate.   Original Report Authenticated By: Natasha Mead, M.D.     Microbiology: Recent Results (from the past 240 hour(s))  CULTURE, BLOOD (ROUTINE X 2)     Status: None   Collection Time    04/23/12  1:40 PM      Result Value Range Status   Specimen Description BLOOD ARM LEFT  Final   Special Requests BOTTLES DRAWN AEROBIC AND ANAEROBIC 10CC   Final   Culture  Setup Time 04/23/2012 23:01   Final   Culture     Final   Value:        BLOOD CULTURE RECEIVED NO GROWTH TO DATE CULTURE WILL BE HELD FOR 5 DAYS BEFORE ISSUING A FINAL NEGATIVE REPORT   Report Status PENDING   Incomplete  CULTURE, BLOOD (ROUTINE X 2)     Status: None   Collection Time    04/23/12  1:45 PM      Result Value Range Status   Specimen Description BLOOD HAND LEFT   Final   Special Requests BOTTLES DRAWN AEROBIC AND ANAEROBIC 10CC   Final   Culture  Setup Time 04/23/2012 23:01   Final   Culture     Final   Value:        BLOOD CULTURE RECEIVED NO GROWTH TO DATE CULTURE WILL BE HELD FOR 5 DAYS BEFORE ISSUING A FINAL NEGATIVE REPORT   Report Status PENDING   Incomplete  URINE CULTURE     Status: None   Collection Time    04/23/12  1:55 PM      Result Value Range Status   Specimen Description URINE, CATHETERIZED   Final   Special Requests NONE   Final   Culture  Setup Time 04/23/2012 14:45   Final    Colony Count NO GROWTH   Final   Culture NO GROWTH   Final   Report Status 04/24/2012 FINAL   Final  MRSA PCR SCREENING     Status: None   Collection Time    04/23/12  5:31 PM      Result Value Range Status   MRSA by PCR NEGATIVE  NEGATIVE Final   Comment:            The GeneXpert MRSA Assay (FDA     approved for NASAL specimens     only), is one component of a     comprehensive MRSA colonization     surveillance program. It is not     intended to diagnose MRSA     infection nor to guide or     monitor treatment for     MRSA infections.     Labs: Basic Metabolic Panel:  Recent Labs Lab 04/23/12 1233 04/23/12 1924 04/24/12 0700 04/25/12 0520  NA 136  --  139 139  K 4.9  --  4.6 4.2  CL 99  --  103 103  CO2 28  --  30 31  GLUCOSE 263*  --  167* 204*  BUN 19  --  22 13  CREATININE 1.47* 1.48* 1.20* 0.87  CALCIUM 9.9  --  9.2 9.7  MG  --  1.9  --   --   PHOS  --  3.7  --   --    Liver Function Tests:  Recent Labs Lab 04/23/12 1233 04/23/12 1924  AST 15 14  ALT 15 14  ALKPHOS 94 81  BILITOT 0.6 0.6  PROT 7.8 6.9  ALBUMIN 3.4* 3.0*   No results found for this basename: LIPASE, AMYLASE,  in the last 168 hours No results found for this basename: AMMONIA,  in the last 168 hours CBC:  Recent Labs Lab 04/23/12 1233 04/23/12 1924 04/24/12 0700 04/25/12 0520  WBC 6.3 7.7 7.6 6.8  NEUTROABS 3.2  --   --   --   HGB 13.7 12.2 12.3 12.5  HCT  40.2 36.4 36.9 36.9  MCV 88.7 89.4 89.3 89.6  PLT 176 157 151 144*   Cardiac Enzymes:  Recent Labs Lab 04/23/12 1337 04/23/12 1924 04/24/12 0100 04/24/12 0700  CKTOTAL 59  --   --   --   CKMB 1.6  --   --   --   TROPONINI  --  <0.30 <0.30 <0.30   BNP: BNP (last 3 results)  Recent Labs  10/25/11 0651 01/30/12 2336  PROBNP 374.4* 893.8*   CBG:  Recent Labs Lab 04/24/12 1148 04/24/12 1701 04/24/12 2101 04/25/12 0742 04/25/12 1148  GLUCAP 236* 180* 245* 180* 260*       Signed:  Rhetta Mura  [facility] 04/25/2012, 1:13 PM

## 2012-04-25 NOTE — Progress Notes (Signed)
Stroke Team Progress Note  HISTORY [name] is an 62 y.o. female with multiple stroke risk factors. Patient was picked up by her daughter for her cardiology office visit 04/23/2012. When they arrived at the cardiology office the nurse could not find a pulse. After a little while a systolic BP was noted to be 70. He cardiologist cut some of her medications in half and they left the office. After leaving the office she was noted to be altered and not moving her left arm and leg well. After arriving in the ER and blood pressure was elevated from 68/38 to asystolically in the 90's her symptoms resolved. She is now back to her baseline. Patient was not a TPA candidate secondary to delay in arrival, NIHSS 0. She was admitted to the neuro ICU for further evaluation and treatment.  SUBJECTIVE Family is at the bedside.  Overall she feels her condition is completely resolved.   OBJECTIVE Most recent Vital Signs: Filed Vitals:   04/24/12 1845 04/24/12 2102 04/25/12 0544 04/25/12 0900  BP: 135/63 134/59 153/74 145/72  Pulse: 69 68 74 70  Temp: 98.2 F (36.8 C) 98.4 F (36.9 C) 98.1 F (36.7 C) 98.4 F (36.9 C)  TempSrc: Oral Oral Oral Oral  Resp: 22 18 18 26   Height:      Weight:    271 lb (122.925 kg)  SpO2: 99% 98% 98% 100%   CBG (last 3)   Recent Labs  04/24/12 1701 04/24/12 2101 04/25/12 0742  GLUCAP 180* 245* 180*    IV Fluid Intake:     MEDICATIONS  . aspirin  325 mg Oral Daily  . atorvastatin  80 mg Oral Daily  . cefTRIAXone (ROCEPHIN)  IV  1 g Intravenous Q24H  . ferrous sulfate  325 mg Oral Q breakfast  . heparin  5,000 Units Subcutaneous Q8H  . insulin aspart  0-15 Units Subcutaneous TID WC  . insulin aspart  4 Units Subcutaneous TID WC  . insulin glargine  60 Units Subcutaneous QHS   PRN:  acetaminophen, acetaminophen, ondansetron (ZOFRAN) IV, ondansetron  Diet:  Cardiac thin liquids Activity:  Up with assistance DVT Prophylaxis:  Heparin 5000 units sq  tid  CLINICALLY SIGNIFICANT STUDIES Basic Metabolic Panel:   Recent Labs Lab 04/23/12 1233 04/23/12 1924 04/24/12 0700 04/25/12 0520  NA 136  --  139 139  K 4.9  --  4.6 4.2  CL 99  --  103 103  CO2 28  --  30 31  GLUCOSE 263*  --  167* 204*  BUN 19  --  22 13  CREATININE 1.47* 1.48* 1.20* 0.87  CALCIUM 9.9  --  9.2 9.7  MG  --  1.9  --   --   PHOS  --  3.7  --   --    Liver Function Tests:   Recent Labs Lab 04/23/12 1233 04/23/12 1924  AST 15 14  ALT 15 14  ALKPHOS 94 81  BILITOT 0.6 0.6  PROT 7.8 6.9  ALBUMIN 3.4* 3.0*   CBC:  Recent Labs Lab 04/23/12 1233  04/24/12 0700 04/25/12 0520  WBC 6.3  < > 7.6 6.8  NEUTROABS 3.2  --   --   --   HGB 13.7  < > 12.3 12.5  HCT 40.2  < > 36.9 36.9  MCV 88.7  < > 89.3 89.6  PLT 176  < > 151 144*  < > = values in this interval not displayed. Coagulation:  Recent Labs Lab 04/23/12 1233  LABPROT 13.6  INR 1.05   Cardiac Enzymes:   Recent Labs Lab 04/23/12 1337 04/23/12 1924 04/24/12 0100 04/24/12 0700  CKTOTAL 59  --   --   --   CKMB 1.6  --   --   --   TROPONINI  --  <0.30 <0.30 <0.30   Urinalysis:   Recent Labs Lab 04/23/12 1355  COLORURINE YELLOW  LABSPEC 1.020  PHURINE 6.0  GLUCOSEU NEGATIVE  HGBUR NEGATIVE  BILIRUBINUR NEGATIVE  KETONESUR NEGATIVE  PROTEINUR NEGATIVE  UROBILINOGEN 0.2  NITRITE NEGATIVE  LEUKOCYTESUR MODERATE*   Lipid Panel    Component Value Date/Time   CHOL 138 04/25/2012 0520   TRIG 113 04/25/2012 0520   HDL 33* 04/25/2012 0520   CHOLHDL 4.2 04/25/2012 0520   VLDL 23 04/25/2012 0520   LDLCALC 82 04/25/2012 0520   HgbA1C  Lab Results  Component Value Date   HGBA1C 8.2* 04/23/2012    Urine Drug Screen:   No results found for this basename: labopia,  cocainscrnur,  labbenz,  amphetmu,  thcu,  labbarb    Alcohol Level: No results found for this basename: ETH,  in the last 168 hours  CT of the brain  04/23/2012   No acute intracranial abnormality.  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  Hypo attenuation in the deep right frontal white matter is probably related to remote ischemia or asymmetric chronic small vessel ischemic disease.  Acute to subacute ischemia cannot be completely excluded.   CT angio of the head   IMPRESSION: Plaque at the right carotid bifurcation extending into the proximal right internal carotid artery. Long segment (spanning over 3 cm) narrowing of proximal right internal carotid artery with maximal 62 % diameter stenosis.  Right internal carotid artery cavernous segment calcification with mild to moderate narrowing.  Calcification left carotid bifurcation without hemodynamically significant stenosis.  Left internal carotid artery cavernous segment plaque with mild to moderate narrowing.  Mild narrowing proximal left vertebral artery. 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  Evaluation of the proximal right vertebral artery is limited by streak artifact. Mild to moderate narrowing of the intracranial aspect of the right vertebral artery with tandem stenoses.  Moderate narrowing and irregularity of the basilar artery.  MRI/A of the brain  pacer  2D Echocardiogram    Carotid Doppler    CXR  04/24/2012  Mild CHF.    04/23/2012  Cardiomegaly.  Status post CABG.  Mild interstitial prominence bilaterally without pulmonary edema.  No segmental infiltrate.    EKG  normal sinus rhythm.   Therapy Recommendations   Physical Exam  General: The patient is alert and cooperative at the time of the examination. The patient is markedly obese.  Skin: No significant peripheral edema is noted.   Neurologic Exam  Cranial nerves: Facial symmetry is present. Speech is normal, no aphasia or dysarthria is noted. Extraocular movements are full. Visual fields are full.  Motor: The patient has good strength in all 4 extremities.  Coordination: The  patient has good finger-nose-finger and heel-to-shin bilaterally.  Gait and station: The gait was not tested. No drift is seen.  Reflexes: Deep tendon reflexes are symmetric.    ASSESSMENT [name] is a 62 y.o. female presenting with left arm and leg with decrease movement in setting of hypotension. Initial imaging shows a possible old stroke. Repeat imaging pending. Suspect right brain watershed infarct due to hypotension.  Concern for intra/extracranial atherosclerosis.  On aspirin 325 mg orally every day prior to admission. Now on aspirin 325 mg orally every day for secondary stroke prevention. Patient with no resultant neuro symptoms. Work up underway.  SIRS, thought to be secondary to UTI based on UA results, on empiric vanc and zosyn Hx hypertension, though hypotensive prior to admission. Medication adjustment under way Acute renal failure, Cr 1.2 (improved from 1.47)  Diabetes, HgbA1c 8.2, goal < 7.0 Morbid obesity, Body mass index is 45.1 kg/(m^2). 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 Either of these issues may have been symptomatic with the hypotensive episode.   TREATMENT/PLAN  Continue aspirin 325 mg orally every day for secondary stroke prevention.  Check lipids LDL 82  Ok to be OOB from neuro standpoint, therapy evals  No surgical lesions found, the carotid doppler should be followed over time, recheck in 6 months  Stroke team to sign off.   04/25/2012 10:05 AM  I have personally obtained a history, examined the patient, evaluated imaging results, and formulated the assessment and plan of care. I agree with the above. Lesly Dukes

## 2012-04-25 NOTE — Progress Notes (Signed)
Physical Therapy Treatment Patient Details Name: [name] MRN: [id number] DOB: [birth year] Today's Date: 04/25/2012 Time: 7829-5621 PT Time Calculation (min): 33 min  PT Assessment / Plan / Recommendation Comments on Treatment Session  Patient is a 62 y/o female admitted with SIRS and metabolic encephalopathy due to UTI with h/o ischemic cardiomyopathy and ARF. Progressing nicely with ambulation today using RW. She will need one for d/c. She is planning to stay with daughter who can provide 24 hour assist right now. She is very motivated to return to living independently.     Follow Up Recommendations  Home health PT;Supervision/Assistance - 24 hour     Does the patient have the potential to tolerate intense rehabilitation     Barriers to Discharge        Equipment Recommendations  Rolling walker with 5" wheels    Recommendations for Other Services    Frequency Min 3X/week   Plan Discharge plan remains appropriate;Frequency remains appropriate    Precautions / Restrictions Precautions Precautions: Fall Precaution Comments: O2 dep Restrictions Weight Bearing Restrictions: No   Pertinent Vitals/Pain Denies pain, SpO2 remained at or near 96% on 2 liters during ambulation    Mobility  Transfers Transfers: Sit to Stand;Stand to Sit Sit to Stand: 4: Min guard;With upper extremity assist Stand to Sit: 4: Min guard;With upper extremity assist Details for Transfer Assistance: cues for safe hand placement Ambulation/Gait Ambulation/Gait Assistance: 5: Supervision Ambulation Distance (Feet): 190 Feet Ambulation/Gait Assistance Details: 90 + 100 ft, 1 seated rest after 90 ft Gait Pattern: Step-through pattern;Decreased stride length;Wide base of support Gait velocity: decreased Stairs: Yes Stairs Assistance: 4: Min assist Stairs Assistance Details (indicate cue type and reason): simulated 1 step with weight shift to the right and lifting LLE up to take a step, needing minA for  stability and RUE HHA, this was performed x2 (reports daughter's house has 1 step and no railings) Stair Management Technique: No rails;Forwards    Exercises General Exercises - Lower Extremity Long Arc Quad: AROM;Both;10 reps;Seated Toe Raises: AROM;Both;10 reps;Seated Heel Raises: AROM;Both;10 reps;Seated    PT Goals Acute Rehab PT Goals PT Goal: Sit to Stand - Progress: Progressing toward goal PT Goal: Stand to Sit - Progress: Progressing toward goal PT Goal: Stand - Progress: Progressing toward goal PT Goal: Ambulate - Progress: Progressing toward goal PT Goal: Up/Down Stairs - Progress: Progressing toward goal  Visit Information  Last PT Received On: 04/25/12 Assistance Needed: +1    Subjective Data  Subjective: I guess I'll be staying with my daughter.  Patient Stated Goal: home   Cognition  Cognition Arousal/Alertness: Awake/alert Behavior During Therapy: WFL for tasks assessed/performed Overall Cognitive Status: Within Functional Limits for tasks assessed    Balance     End of Session PT - End of Session Equipment Utilized During Treatment: Gait belt;Oxygen Activity Tolerance: Patient tolerated treatment well Patient left: in chair;with call bell/phone within reach Nurse Communication: Mobility status   GP     [facility] HELEN 04/25/2012, 11:03 AM

## 2012-04-29 LAB — CULTURE, BLOOD (ROUTINE X 2): Culture: NO GROWTH

## 2012-05-30 ENCOUNTER — Emergency Department (HOSPITAL_COMMUNITY): Payer: Medicaid Other

## 2012-05-30 ENCOUNTER — Encounter (HOSPITAL_COMMUNITY): Payer: Self-pay | Admitting: Emergency Medicine

## 2012-05-30 ENCOUNTER — Emergency Department (HOSPITAL_COMMUNITY)
Admission: EM | Admit: 2012-05-30 | Discharge: 2012-05-30 | Disposition: A | Discharge status: Home / Self Care | Payer: Medicaid Other | Attending: Emergency Medicine | Admitting: Emergency Medicine

## 2012-05-30 DIAGNOSIS — Z9989 Dependence on other enabling machines and devices: Secondary | ICD-10-CM | POA: Insufficient documentation

## 2012-05-30 DIAGNOSIS — I2510 Atherosclerotic heart disease of native coronary artery without angina pectoris: Secondary | ICD-10-CM | POA: Insufficient documentation

## 2012-05-30 DIAGNOSIS — I509 Heart failure, unspecified: Secondary | ICD-10-CM | POA: Insufficient documentation

## 2012-05-30 DIAGNOSIS — Z8679 Personal history of other diseases of the circulatory system: Secondary | ICD-10-CM | POA: Insufficient documentation

## 2012-05-30 DIAGNOSIS — E119 Type 2 diabetes mellitus without complications: Secondary | ICD-10-CM | POA: Insufficient documentation

## 2012-05-30 DIAGNOSIS — I10 Essential (primary) hypertension: Secondary | ICD-10-CM | POA: Insufficient documentation

## 2012-05-30 DIAGNOSIS — Z794 Long term (current) use of insulin: Secondary | ICD-10-CM | POA: Insufficient documentation

## 2012-05-30 DIAGNOSIS — E669 Obesity, unspecified: Secondary | ICD-10-CM | POA: Insufficient documentation

## 2012-05-30 DIAGNOSIS — Z951 Presence of aortocoronary bypass graft: Secondary | ICD-10-CM | POA: Insufficient documentation

## 2012-05-30 DIAGNOSIS — G4700 Insomnia, unspecified: Secondary | ICD-10-CM | POA: Insufficient documentation

## 2012-05-30 DIAGNOSIS — Z7982 Long term (current) use of aspirin: Secondary | ICD-10-CM | POA: Insufficient documentation

## 2012-05-30 DIAGNOSIS — Z79899 Other long term (current) drug therapy: Secondary | ICD-10-CM | POA: Insufficient documentation

## 2012-05-30 DIAGNOSIS — R42 Dizziness and giddiness: Secondary | ICD-10-CM

## 2012-05-30 LAB — PRO B NATRIURETIC PEPTIDE: Pro B Natriuretic peptide (BNP): 472.3 pg/mL — ABNORMAL HIGH (ref 0–125)

## 2012-05-30 LAB — BASIC METABOLIC PANEL
CO2: 27 mEq/L (ref 19–32)
Glucose, Bld: 238 mg/dL — ABNORMAL HIGH (ref 70–99)
Potassium: 4.8 mEq/L (ref 3.5–5.1)
Sodium: 137 mEq/L (ref 135–145)

## 2012-05-30 LAB — POCT I-STAT TROPONIN I: Troponin i, poc: 0.01 ng/mL (ref 0.00–0.08)

## 2012-05-30 LAB — CBC
Hemoglobin: 12.8 g/dL (ref 12.0–15.0)
RBC: 4.28 MIL/uL (ref 3.87–5.11)

## 2012-05-30 MED ORDER — CARVEDILOL 25 MG PO TABS: 12.5000 mg | ORAL_TABLET | Freq: Two times a day (BID) | ORAL | Status: DC

## 2012-05-30 NOTE — ED Notes (Addendum)
Pt c/o dizziness, States she feels like her head is swimming. Denies cp/sob.no loc. Pt has hx of same, in no signs of distress

## 2012-05-30 NOTE — ED Provider Notes (Signed)
History     CSN: [id number]  Arrival date & time 05/30/12  1036   First MD Initiated Contact with Patient 05/30/12 1102      Chief Complaint  Patient presents with  . Dizziness    (Consider location/radiation/quality/duration/timing/severity/associated sxs/prior treatment) HPI  Patient presents with concern of lightheadedness. She woke with this sensation, and it is worse with upright positioning and walking. There are no other new focal complaints, such as new dyspnea, new chest pain, new syncope, nausea, vomiting, diarrhea. No confusion, no disorientation. The patient is here with family members state that the patient is speaking clearly, and is in her typical appearance. Since onset no clear alleviating factors beyond some improvement with rest.   Past Medical History  Diagnosis Date  . Coronary artery disease     s/p CABG 2007  . Sleep apnea     uses O2 at night  . Ischemic cardiomyopathy     EF 30-35%, s/p ICD 4/08 by Dr Amil Amen  . Diabetes mellitus     type II  . Hypertension   . Obesity   . CHF (congestive heart failure)   . Oxygen dependent 2 L    Past Surgical History  Procedure Laterality Date  . Cardiac catheterization    . Cardiac defibrillator placement  4/08    by Dr Amil Amen ([facility])  . Coronary artery bypass graft  2007  . Implantable cardioverter defibrillator implant      Family History  Problem Relation Age of Onset  . Diabetes Mother 22    died - HTN  . Other      No early family hx of CAD    History  Substance Use Topics  . Smoking status: Never Smoker   . Smokeless tobacco: Not on file  . Alcohol Use: No    OB History   Grav Para Term Preterm Abortions TAB SAB Ect Mult Living                  Review of Systems  Constitutional:       Per HPI, otherwise negative  HENT:       Per HPI, otherwise negative  Respiratory:       Per HPI, otherwise negative  Cardiovascular:       Per HPI, otherwise negative  Gastrointestinal:  Negative for vomiting.  Endocrine:       Negative aside from HPI  Genitourinary:       Neg aside from HPI   Musculoskeletal:       Per HPI, otherwise negative  Skin: Negative.   Neurological: Positive for light-headedness. Negative for dizziness, seizures, syncope, facial asymmetry, speech difficulty, numbness and headaches.    Allergies  Metformin and related  Home Medications   Current Outpatient Rx  Name  Route  Sig  Dispense  Refill  . aspirin 325 MG tablet   Oral   Take 325 mg by mouth daily.           Marland Kitchen atorvastatin (LIPITOR) 20 MG tablet   Oral   Take 80 mg by mouth daily.          . carvedilol (COREG) 25 MG tablet   Oral   Take 25 mg by mouth 2 (two) times daily with a meal.         . Colesevelam HCl (WELCHOL) 3.75 G PACK   Oral   Take 1 packet by mouth daily. Mix packet with water or juice         .  ferrous sulfate 325 (65 FE) MG tablet   Oral   Take 325 mg by mouth daily with breakfast.         . insulin glargine (LANTUS) 100 UNIT/ML injection   Subcutaneous   Inject 90 Units into the skin at bedtime.          . insulin lispro (HUMALOG) 100 UNIT/ML injection   Subcutaneous   Inject 60 Units into the skin 3 (three) times daily after meals.          . isosorbide mononitrate (IMDUR) 60 MG 24 hr tablet   Oral   Take 60 mg by mouth daily.         Marland Kitchen ketoconazole (NIZORAL) 2 % cream   Topical   Apply 1 application topically daily as needed (for dryness under breast).         Marland Kitchen OVER THE COUNTER MEDICATION   Topical   Apply 1 application topically daily as needed (hydrocortisone cream:apply to breast area for redness and itching).           BP 100/49  Pulse 60  Temp(Src) 97.5 F (36.4 C) (Oral)  Resp 14  SpO2 94%  Physical Exam  Nursing note and vitals reviewed. Constitutional: She is oriented to person, place, and time. She appears well-developed and well-nourished. No distress.  HENT:  Head: Normocephalic and atraumatic.   Eyes: Conjunctivae and EOM are normal.  Cardiovascular: Normal rate and regular rhythm.   Pulmonary/Chest: Effort normal and breath sounds normal. No stridor. No respiratory distress.  Abdominal: She exhibits no distension.  Musculoskeletal: She exhibits no edema.  Neurological: She is alert and oriented to person, place, and time. She displays no atrophy and no tremor. No cranial nerve deficit or sensory deficit. She exhibits normal muscle tone. She displays no seizure activity. Coordination normal.  Skin: Skin is warm and dry.  Psychiatric: She has a normal mood and affect.    ED Course  Procedures (including critical care time)  Labs Reviewed  BASIC METABOLIC PANEL - Abnormal; Notable for the following:    Glucose, Bld 238 (*)    GFR calc non Af Amer 57 (*)    GFR calc Af Amer 66 (*)    All other components within normal limits  GLUCOSE, CAPILLARY - Abnormal; Notable for the following:    Glucose-Capillary 203 (*)    All other components within normal limits  CBC  PRO B NATRIURETIC PEPTIDE   No results found.   No diagnosis found.  O2- 99%ra, normal  Cardiac: 60 sr, normal   Date: 05/30/2012  Rate: 61  Rhythm: normal sinus rhythm  QRS Axis: left  Intervals: QT prolonged  ST/T Wave abnormalities: nonspecific T wave changes  Conduction Disutrbances:nonspecific intraventricular conduction delay  Narrative Interpretation:   Old EKG Reviewed: unchanged ABNORMAL   Update: On repeat exam the patient appears calm, vital signs remained consistent, with borderline hypotension.  Heart rate is in the 60s. No new complaints.  Update: Labs are largely unremarkable for her. MDM  This patient presents with concern of mild lightheadedness, no syncope, no neurologic deficits, no chest pain, dyspnea.  She has multiple comorbidities, but they seem well managed with her medication.  She has required recent titration of her antihypertensive 2/2 somewhat similar events, and this  seems most likely given this absence of focal abnormalities on exam or via lab eval.  We discussed return precautions at length, and the patient has a previously scheduled cardiology follow up appointment in 4  days.  She was discharged in stable condition.    Gerhard Munch, MD 05/30/12 [phone]

## 2012-05-30 NOTE — ED Notes (Signed)
Patient transported to X-ray 

## 2012-05-30 NOTE — ED Notes (Signed)
Pt given spirte zero and ice chips

## 2012-06-16 ENCOUNTER — Ambulatory Visit (INDEPENDENT_AMBULATORY_CARE_PROVIDER_SITE_OTHER): Payer: Medicaid Other | Admitting: *Deleted

## 2012-06-16 DIAGNOSIS — I5023 Acute on chronic systolic (congestive) heart failure: Secondary | ICD-10-CM

## 2012-06-16 DIAGNOSIS — I2589 Other forms of chronic ischemic heart disease: Secondary | ICD-10-CM

## 2012-06-16 DIAGNOSIS — I255 Ischemic cardiomyopathy: Secondary | ICD-10-CM

## 2012-06-16 LAB — ICD DEVICE OBSERVATION
AL AMPLITUDE: 1.1 mv
BAMS-0001: 170 {beats}/min
BATTERY VOLTAGE: 2.83 V
FVT: 0
RV LEAD AMPLITUDE: 9 mv
RV LEAD IMPEDENCE ICD: 440 Ohm
RV LEAD THRESHOLD: 0.5 V
TOT-0001: 1
TZAT-0001ATACH: 2
TZAT-0001ATACH: 3
TZAT-0001FASTVT: 1
TZAT-0001SLOWVT: 1
TZAT-0001SLOWVT: 2
TZAT-0004SLOWVT: 8
TZAT-0004SLOWVT: 8
TZAT-0012ATACH: 150 ms
TZAT-0012ATACH: 150 ms
TZAT-0012ATACH: 150 ms
TZAT-0012FASTVT: 200 ms
TZAT-0012SLOWVT: 200 ms
TZAT-0018ATACH: NEGATIVE
TZAT-0018FASTVT: NEGATIVE
TZAT-0019ATACH: 6 V
TZAT-0020ATACH: 1.5 ms
TZAT-0020ATACH: 1.5 ms
TZAT-0020SLOWVT: 1.5 ms
TZAT-0020SLOWVT: 1.5 ms
TZON-0003ATACH: 350 ms
TZON-0003SLOWVT: 350 ms
TZON-0003VSLOWVT: 400 ms
TZON-0004VSLOWVT: 20
TZST-0001ATACH: 4
TZST-0001ATACH: 6
TZST-0001FASTVT: 2
TZST-0001FASTVT: 4
TZST-0001FASTVT: 6
TZST-0001SLOWVT: 3
TZST-0001SLOWVT: 5
TZST-0002ATACH: NEGATIVE
TZST-0002ATACH: NEGATIVE
TZST-0002FASTVT: NEGATIVE
TZST-0003SLOWVT: 35 J
VF: 0

## 2012-06-16 NOTE — Progress Notes (Signed)
ICD check with ICM in office. 

## 2012-07-10 ENCOUNTER — Encounter: Payer: Self-pay | Admitting: Internal Medicine

## 2012-09-07 ENCOUNTER — Emergency Department (HOSPITAL_COMMUNITY): Payer: Medicaid Other

## 2012-09-07 ENCOUNTER — Encounter (HOSPITAL_COMMUNITY): Payer: Self-pay | Admitting: Emergency Medicine

## 2012-09-07 ENCOUNTER — Inpatient Hospital Stay (HOSPITAL_COMMUNITY)
Admission: EM | Admit: 2012-09-07 | Discharge: 2012-09-11 | DRG: 292 | Disposition: A | Discharge status: Home / Self Care | Payer: Medicaid Other | Attending: Cardiology | Admitting: Cardiology

## 2012-09-07 DIAGNOSIS — J4489 Other specified chronic obstructive pulmonary disease: Secondary | ICD-10-CM | POA: Diagnosis present

## 2012-09-07 DIAGNOSIS — I10 Essential (primary) hypertension: Secondary | ICD-10-CM | POA: Diagnosis present

## 2012-09-07 DIAGNOSIS — I255 Ischemic cardiomyopathy: Secondary | ICD-10-CM | POA: Diagnosis present

## 2012-09-07 DIAGNOSIS — I2510 Atherosclerotic heart disease of native coronary artery without angina pectoris: Secondary | ICD-10-CM

## 2012-09-07 DIAGNOSIS — E162 Hypoglycemia, unspecified: Secondary | ICD-10-CM | POA: Diagnosis present

## 2012-09-07 DIAGNOSIS — Z888 Allergy status to other drugs, medicaments and biological substances status: Secondary | ICD-10-CM

## 2012-09-07 DIAGNOSIS — E785 Hyperlipidemia, unspecified: Secondary | ICD-10-CM | POA: Diagnosis present

## 2012-09-07 DIAGNOSIS — J449 Chronic obstructive pulmonary disease, unspecified: Secondary | ICD-10-CM | POA: Diagnosis present

## 2012-09-07 DIAGNOSIS — Z6841 Body Mass Index (BMI) 40.0 and over, adult: Secondary | ICD-10-CM

## 2012-09-07 DIAGNOSIS — G4730 Sleep apnea, unspecified: Secondary | ICD-10-CM | POA: Diagnosis present

## 2012-09-07 DIAGNOSIS — I509 Heart failure, unspecified: Secondary | ICD-10-CM | POA: Diagnosis present

## 2012-09-07 DIAGNOSIS — I959 Hypotension, unspecified: Secondary | ICD-10-CM

## 2012-09-07 DIAGNOSIS — Z794 Long term (current) use of insulin: Secondary | ICD-10-CM

## 2012-09-07 DIAGNOSIS — E119 Type 2 diabetes mellitus without complications: Secondary | ICD-10-CM

## 2012-09-07 DIAGNOSIS — R0601 Orthopnea: Secondary | ICD-10-CM | POA: Diagnosis not present

## 2012-09-07 DIAGNOSIS — I447 Left bundle-branch block, unspecified: Secondary | ICD-10-CM | POA: Diagnosis present

## 2012-09-07 DIAGNOSIS — Z951 Presence of aortocoronary bypass graft: Secondary | ICD-10-CM

## 2012-09-07 DIAGNOSIS — Z9981 Dependence on supplemental oxygen: Secondary | ICD-10-CM

## 2012-09-07 DIAGNOSIS — IMO0001 Reserved for inherently not codable concepts without codable children: Secondary | ICD-10-CM | POA: Diagnosis present

## 2012-09-07 DIAGNOSIS — I5023 Acute on chronic systolic (congestive) heart failure: Secondary | ICD-10-CM

## 2012-09-07 DIAGNOSIS — Z7982 Long term (current) use of aspirin: Secondary | ICD-10-CM

## 2012-09-07 DIAGNOSIS — E1169 Type 2 diabetes mellitus with other specified complication: Secondary | ICD-10-CM | POA: Diagnosis present

## 2012-09-07 DIAGNOSIS — I2589 Other forms of chronic ischemic heart disease: Secondary | ICD-10-CM | POA: Diagnosis present

## 2012-09-07 DIAGNOSIS — Z95810 Presence of automatic (implantable) cardiac defibrillator: Secondary | ICD-10-CM

## 2012-09-07 DIAGNOSIS — IMO0002 Reserved for concepts with insufficient information to code with codable children: Secondary | ICD-10-CM

## 2012-09-07 DIAGNOSIS — Z833 Family history of diabetes mellitus: Secondary | ICD-10-CM

## 2012-09-07 DIAGNOSIS — I422 Other hypertrophic cardiomyopathy: Secondary | ICD-10-CM | POA: Diagnosis present

## 2012-09-07 LAB — POCT I-STAT TROPONIN I: Troponin i, poc: 0.02 ng/mL (ref 0.00–0.08)

## 2012-09-07 LAB — CBC
Hemoglobin: 14.1 g/dL (ref 12.0–15.0)
MCH: 30.6 pg (ref 26.0–34.0)
Platelets: 176 10*3/uL (ref 150–400)
RBC: 4.61 MIL/uL (ref 3.87–5.11)
WBC: 5.9 10*3/uL (ref 4.0–10.5)

## 2012-09-07 LAB — BASIC METABOLIC PANEL
BUN: 9 mg/dL (ref 6–23)
Chloride: 101 mEq/L (ref 96–112)
GFR calc Af Amer: >90 mL/min (ref >90)
GFR calc non Af Amer: >90 mL/min (ref >90)
Potassium: 4.8 mEq/L (ref 3.5–5.1)
Sodium: 140 mEq/L (ref 135–145)

## 2012-09-07 LAB — PRO B NATRIURETIC PEPTIDE: Pro B Natriuretic peptide (BNP): 1025 pg/mL — ABNORMAL HIGH (ref 0–125)

## 2012-09-07 MED ORDER — FUROSEMIDE 10 MG/ML IJ SOLN
80.0000 mg | INTRAMUSCULAR | Status: AC
  Administered 2012-09-07: 80 mg via INTRAVENOUS
  Filled 2012-09-07: qty 8

## 2012-09-07 NOTE — ED Provider Notes (Signed)
CSN: [id number]     Arrival date & time 09/07/12  2106 History   First MD Initiated Contact with Patient 09/07/12 2122     Chief Complaint  Patient presents with  . Shortness of Breath    The history is provided by the patient.   [name] is a 62 year old female with a PMH of CAD s/p CABG 2007, ischemic cardiomyopathy (EF 25-30% 04/2012), ICD 2008, DM, HTN, CHF, and Oxygen dependent (home O2 2L) who presents to the ED for SOB.  Patient states that she developed worsening SOB sometime this afternoon, which has been getting progressively worse throughout the week.  At home she tried her albuterol inhaler with no relief.  She also increased her home O2 from 2 L to 3 L, which did provide her some relief. She also describes chest pain, which is described as a pressure and tightness on the mid-sternal region without radiation of her chest.  Other associated symptoms include diaphoresis.  She denies any recent fevers, chills, rhinorrhea, congestion, cough, abdominal pain, nausea, emesis, headache, dizziness, lightheadedness, or leg edema.  She denies any history of tobacco abuse.  She states that she was on lasix a few months ago but was taken off of it because her blood pressure was dropping too low.  Cardiologist ist Dr. Anne Fu.     Past Medical History  Diagnosis Date  . Coronary artery disease     s/p CABG 2007  . Sleep apnea     uses O2 at night  . Ischemic cardiomyopathy     EF 30-35%, s/p ICD 4/08 by Dr Amil Amen  . Diabetes mellitus     type II  . Hypertension   . Obesity   . CHF (congestive heart failure)   . Oxygen dependent 2 L   Past Surgical History  Procedure Laterality Date  . Cardiac catheterization    . Cardiac defibrillator placement  4/08    by Dr Amil Amen (MDT)  . Coronary artery bypass graft  2007  . Implantable cardioverter defibrillator implant     Family History  Problem Relation Age of Onset  . Diabetes Mother 71    died - HTN  . Other      No early family  hx of CAD   History  Substance Use Topics  . Smoking status: Never Smoker   . Smokeless tobacco: Not on file  . Alcohol Use: No   OB History   Grav Para Term Preterm Abortions TAB SAB Ect Mult Living                 Review of Systems  Constitutional: Positive for diaphoresis. Negative for fever, chills, activity change, appetite change and fatigue.  HENT: Negative for congestion, sore throat, rhinorrhea, neck pain and neck stiffness.   Eyes: Negative for visual disturbance.  Respiratory: Positive for chest tightness and shortness of breath. Negative for cough, wheezing and stridor.   Cardiovascular: Positive for chest pain. Negative for palpitations and leg swelling.  Gastrointestinal: Negative for nausea, vomiting, abdominal pain, diarrhea and constipation.  Musculoskeletal: Negative for back pain.  Skin: Negative for wound.    Allergies  Metformin and related  Home Medications   Current Outpatient Rx  Name  Route  Sig  Dispense  Refill  . aspirin 325 MG tablet   Oral   Take 325 mg by mouth daily.           Marland Kitchen atorvastatin (LIPITOR) 20 MG tablet   Oral  Take 80 mg by mouth daily.          . carvedilol (COREG) 25 MG tablet   Oral   Take 0.5 tablets (12.5 mg total) by mouth 2 (two) times daily with a meal.   30 tablet   0   . Colesevelam HCl (WELCHOL) 3.75 G PACK   Oral   Take 1 packet by mouth daily. Mix packet with water or juice         . ferrous sulfate 325 (65 FE) MG tablet   Oral   Take 325 mg by mouth daily with breakfast.         . insulin glargine (LANTUS) 100 UNIT/ML injection   Subcutaneous   Inject 90 Units into the skin at bedtime.          . insulin lispro (HUMALOG) 100 UNIT/ML injection   Subcutaneous   Inject 60 Units into the skin 3 (three) times daily after meals.          . isosorbide mononitrate (IMDUR) 60 MG 24 hr tablet   Oral   Take 60 mg by mouth daily.         Marland Kitchen ketoconazole (NIZORAL) 2 % cream   Topical   Apply  1 application topically daily as needed (for dryness under breast).         Marland Kitchen OVER THE COUNTER MEDICATION   Topical   Apply 1 application topically daily as needed (hydrocortisone cream:apply to breast area for redness and itching).          BP 183/95  Temp(Src) 98 F (36.7 C) (Oral)  Filed Vitals:   09/07/12 2130 09/07/12 2145 09/07/12 2200 09/07/12 2230  BP: 168/84 165/82 166/69 158/83  Pulse: 71 67 67 69  Temp:      TempSrc:      Resp: 33 32 35 29  SpO2: 99% 100% 98% 96%    Physical Exam  Nursing note and vitals reviewed. Constitutional: She is oriented to person, place, and time. She appears well-developed and well-nourished. No distress.  HENT:  Head: Normocephalic and atraumatic.  Right Ear: External ear normal.  Left Ear: External ear normal.  Nose: Nose normal.  Mouth/Throat: Oropharynx is clear and moist. No oropharyngeal exudate.  Eyes: Conjunctivae are normal. Pupils are equal, round, and reactive to light. Right eye exhibits no discharge. Left eye exhibits no discharge.  Neck: Normal range of motion. Neck supple.  Cardiovascular: Normal rate, regular rhythm, normal heart sounds and intact distal pulses.  Exam reveals no gallop and no friction rub.   No murmur heard. Pulmonary/Chest: Effort normal and breath sounds normal. No respiratory distress. She has no wheezes. She has no rales. She exhibits no tenderness.  Decreased breath sounds throughout  Abdominal: Soft. Bowel sounds are normal. She exhibits no distension. There is no tenderness. There is no rebound.  Musculoskeletal: Normal range of motion. She exhibits edema. She exhibits no tenderness.  Trace pitting leg edema bilaterally  Neurological: She is alert and oriented to person, place, and time.  Skin: Skin is warm and dry. She is not diaphoretic.    ED Course  Procedures (including critical care time) Labs Review Labs Reviewed  CBC  BASIC METABOLIC PANEL  PRO B NATRIURETIC PEPTIDE   Imaging  Review No results found.   Results for orders placed during the hospital encounter of 09/07/12  CBC      Result Value Range   WBC 5.9  4.0 - 10.5 K/uL   RBC 4.61  3.87 - 5.11 MIL/uL   Hemoglobin 14.1  12.0 - 15.0 g/dL   HCT 16.1  09.6 - 04.5 %   MCV 92.0  78.0 - 100.0 fL   MCH 30.6  26.0 - 34.0 pg   MCHC 33.3  30.0 - 36.0 g/dL   RDW 40.9  81.1 - 91.4 %   Platelets 176  150 - 400 K/uL  BASIC METABOLIC PANEL      Result Value Range   Sodium 140  135 - 145 mEq/L   Potassium 4.8  3.5 - 5.1 mEq/L   Chloride 101  96 - 112 mEq/L   CO2 30  19 - 32 mEq/L   Glucose, Bld 238 (*) 70 - 99 mg/dL   BUN 9  6 - 23 mg/dL   Creatinine, Ser 7.82  0.50 - 1.10 mg/dL   Calcium 9.3  8.4 - 95.6 mg/dL   GFR calc non Af Amer >90  >90 mL/min   GFR calc Af Amer >90  >90 mL/min  PRO B NATRIURETIC PEPTIDE      Result Value Range   Pro B Natriuretic peptide (BNP) 1025.0 (*) 0 - 125 pg/mL  POCT I-STAT TROPONIN I      Result Value Range   Troponin i, poc 0.02  0.00 - 0.08 ng/mL   Comment 3              Date: 09/07/2012  Rate: 78  Rhythm: normal sinus rhythm  QRS Axis: normal  Intervals: PR prolonged (240)   ST/T Wave abnormalities: LBBB - indeterminate    Conduction Disutrbances:left bundle branch block  Narrative Interpretation:   Old EKG Reviewed: 05/30/12 - NSR    DG Chest Port 1 View (Final result)  Result time: 09/07/12 22:20:42    Final result by Rad Results In Interface (09/07/12 22:20:42)    Narrative:   *RADIOLOGY REPORT*  Clinical Data: Chest pain, shortness of breath  PORTABLE CHEST - 1 VIEW  Comparison: Prior radiograph from 05/30/2012  Findings: Left-sided dual lead pacemaker / AICD is stable in position. Median sternotomy wires remain intact. Cardiomegaly is grossly stable.  The lungs remain hypoinflated. There is diffuse pulmonary vascular congestion with interstitial edema. Small right pleural effusion is present. No definite focal airspace consolidation  identified. There is no pneumothorax.  No acute osseous abnormality.  IMPRESSION: Cardiomegaly with diffuse pulmonary edema.   Original Report Authenticated By: Rise Mu, M.D.         MDM   1. CHF (congestive heart failure)      Renu L Storr is a 62 year old female with a PMH of CAD s/p CABG 2007, ischemic cardiomyopathy (EF 30-35% 2008), ICD 2008, DM, HTN, CHF, and Oxygen dependent who presents to the ED for SOB.  CBC, BMP, BNP, EKG, chest x-ray, and troponin ordered to further evaluate.  80 mg IV lasix ordered   04/2012 (ECHO)  Study Conclusions  - Procedure narrative: Transthoracic echocardiography. Image quality was suboptimal. The study was technically difficult, as a result of poor acoustic windows, poor sound wave transmission, restricted patient mobility, and body habitus. - Left ventricle: Wall thickness was increased in a pattern of moderate LVH. Systolic function was severely reduced. The estimated ejection fraction was in the range of 25% to 30%. Diffuse hypokinesis. Probable akinesis of the basal-midanteroseptal myocardium. Features are consistent with a pseudonormal left ventricular filling pattern, with concomitant abnormal relaxation and increased filling pressure (grade 2 diastolic dysfunction). Doppler parameters are consistent with high ventricular filling pressure. -  Left atrium: The atrium was moderately dilated. - Right ventricle: Possible pacer wire or catheter noted in right ventricle. Transthoracic echocardiography. M-mode, complete 2D, spectral Doppler, and color Doppler. Height: Height: 165.1cm. Height: 65in. Weight: Weight: 122.9kg. Weight: 270.4lb. Body mass index: BMI: 45.1kg/m^2. Body surface area: BSA: 2.1m^2. Blood pressure: 109/42. Patient status: Inpatient. Location: ICU/CCU   Rechecks  10:58 PM = Patient states that she is feeling much better.  Sitting on the commode due to increased frequency of urination from  lasix. States chest pain/pressure has resolved.   Consults  10:53 PM = Spoke with cardiologist Dr. Katha Cabal who is coming to the ED to evaluate the patient    Patient will be admitted as an inpatient for further evaluation and management of her shortness of breath and chest pain. Etiology of symptoms are likely due to a CHF exacerbation.  Patient's BNP was elevated at 1025. Patient's chest x-ray was also suggestive of congestive heart failure with diffuse pulmonary vascular congestion and interstitial edema. She was given 80 mg IV Lasix in the emergency department. Her symptoms improved throughout her emergency department visit.  Her troponin was negative and her EKG showed a left bundle branch block.  Patient was in agreement with admission and plan.    Final impressions: 1. CHF exacerbation  2. Chest pain       Greer Ee Britten Parady PA-C    Jillyn Ledger, PA-C 09/07/12 [phone]

## 2012-09-07 NOTE — ED Notes (Signed)
Pt. reports progressing SOB with chest tightness onset today , pt.stated history of CAD and CHF .

## 2012-09-07 NOTE — H&P (Signed)
[name] is an 62 y.o. female.    PCP: Marva Panda, NP  Primary Cardiologist: Dr. Donato Schultz  Chief Complaint: Chest pain and shortness of breath  HPI: 62 y/o female with a PMH s/p CADG x 5 10/02/2005 with LIMA-->D2, SVG-->RCA, SVG-->intermediate artery with sequential-->OM1 and sequential-->OM2, ICM (LVEF 25-30% by TTE 04/2012) presenting for evaluation of chest pain and shortness of breath.  She is normally on chronic 2 L oxygen continuously at home and is normally only able to walk around the house before she gets shortness of breath.  She reports eating a large can of Chicken Noodle soup about 4 pm on the day of presentation.  About 3 hours after eating the large can of Chicken Noodle soup, she began to experience increasing shortness of breath to the point where she had to increase her oxygen level from 2 L to 3 L to feel comfortable breathing.  In addition, she started to experience chest discomfort which she described as tightness 4/10 in severity associated with diaphoresis and shortness of breath.  She denies nausea/vomiting, radiation, dizziness or syncope. She has baseline 2 pillow orthopnea which has not changed.  On presentation, her EKG obtained 09/07/2012 at 21:18 showed sinus rhythm (heart rate 72 bpm) with LBBB.  Her chest X-ray is consistent with pulmonary edema.  Her first set of cardiac marker is unremarkable.  She has been given Lasix 80 mg i.v.  Although she does feel better, she is not quite back to her normal baseline yet.  She is hemodynamically stable.  She denies weight changes.  Last self-reported weight last week is 268 pounds. She is yet to be weighed today.   Past Medical History  Diagnosis Date  . Coronary artery disease     s/p CABG 2007  . Sleep apnea     uses O2 at night  . Ischemic cardiomyopathy     EF 30-35%, s/p ICD 4/08 by Dr Amil Amen  . Diabetes mellitus     type II  . Hypertension   . Obesity   . CHF (congestive heart failure)   . Oxygen  dependent 2 L    Past Surgical History  Procedure Laterality Date  . Cardiac catheterization    . Cardiac defibrillator placement  4/08    by Dr Amil Amen (MDT)  . Coronary artery bypass graft  2007  . Implantable cardioverter defibrillator implant      Family History  Problem Relation Age of Onset  . Diabetes Mother 26    died - HTN  . Other      No early family hx of CAD   Social History:  reports that she has never smoked. She does not have any smokeless tobacco history on file. She reports that she does not drink alcohol or use illicit drugs.  Allergies:  Allergies  Allergen Reactions  . Metformin And Related Other (See Comments)    Leg pain and itching      (Not in a hospital admission)  Results for orders placed during the hospital encounter of 09/07/12 (from the past 48 hour(s))  PRO B NATRIURETIC PEPTIDE     Status: Abnormal   Collection Time    09/07/12  9:10 PM      Result Value Range   Pro B Natriuretic peptide (BNP) 1025.0 (*) 0 - 125 pg/mL  CBC     Status: None   Collection Time    09/07/12  9:26 PM      Result Value Range  WBC 5.9  4.0 - 10.5 K/uL   RBC 4.61  3.87 - 5.11 MIL/uL   Hemoglobin 14.1  12.0 - 15.0 g/dL   HCT 40.9  81.1 - 91.4 %   MCV 92.0  78.0 - 100.0 fL   MCH 30.6  26.0 - 34.0 pg   MCHC 33.3  30.0 - 36.0 g/dL   RDW 78.2  95.6 - 21.3 %   Platelets 176  150 - 400 K/uL  BASIC METABOLIC PANEL     Status: Abnormal   Collection Time    09/07/12  9:26 PM      Result Value Range   Sodium 140  135 - 145 mEq/L   Potassium 4.8  3.5 - 5.1 mEq/L   Comment: HEMOLYSIS AT THIS LEVEL MAY AFFECT RESULT   Chloride 101  96 - 112 mEq/L   CO2 30  19 - 32 mEq/L   Glucose, Bld 238 (*) 70 - 99 mg/dL   BUN 9  6 - 23 mg/dL   Creatinine, Ser 0.86  0.50 - 1.10 mg/dL   Calcium 9.3  8.4 - 57.8 mg/dL   GFR calc non Af Amer >90  >90 mL/min   GFR calc Af Amer >90  >90 mL/min   Comment: (NOTE)     The eGFR has been calculated using the CKD EPI equation.      This calculation has not been validated in all clinical situations.     eGFR's persistently <90 mL/min signify possible Chronic Kidney     Disease.  POCT I-STAT TROPONIN I     Status: None   Collection Time    09/07/12  9:37 PM      Result Value Range   Troponin i, poc 0.02  0.00 - 0.08 ng/mL   Comment 3            Comment: Due to the release kinetics of cTnI,     a negative result within the first hours     of the onset of symptoms does not rule out     myocardial infarction with certainty.     If myocardial infarction is still suspected,     repeat the test at appropriate intervals.   Dg Chest Port 1 View  09/07/2012   *RADIOLOGY REPORT*  Clinical Data: Chest pain, shortness of breath  PORTABLE CHEST - 1 VIEW  Comparison: Prior radiograph from 05/30/2012  Findings: Left-sided dual lead pacemaker / AICD is stable in position.  Median sternotomy wires remain intact.  Cardiomegaly is grossly stable.  The lungs remain hypoinflated.  There is diffuse pulmonary vascular congestion with interstitial edema.  Small right pleural effusion is present.  No definite focal airspace consolidation identified. There is no pneumothorax.  No acute osseous abnormality.  IMPRESSION: Cardiomegaly with diffuse pulmonary edema.   Original Report Authenticated By: Rise Mu, M.D.    Review of Systems  Constitutional: Positive for diaphoresis. Negative for fever, chills, weight loss and malaise/fatigue.  HENT: Negative for hearing loss, ear pain, nosebleeds, congestion, sore throat, neck pain, tinnitus and ear discharge.   Eyes: Negative for blurred vision, double vision, photophobia, pain and discharge.  Respiratory: Positive for shortness of breath. Negative for cough, hemoptysis, sputum production, wheezing and stridor.   Cardiovascular: Positive for chest pain, orthopnea and leg swelling. Negative for palpitations, claudication and PND.  Gastrointestinal: Negative for heartburn, nausea, vomiting,  abdominal pain, diarrhea, constipation, blood in stool and melena.  Genitourinary: Negative for dysuria, urgency, frequency and hematuria.  Musculoskeletal:  Negative for myalgias, back pain, joint pain and falls.  Skin: Negative for itching and rash.  Neurological: Negative for dizziness, tingling, tremors, sensory change, speech change, focal weakness, seizures, weakness and headaches.  Psychiatric/Behavioral: Negative for depression and hallucinations.    Blood pressure 158/83, pulse 69, temperature 98 F (36.7 C), temperature source Oral, resp. rate 29, SpO2 96.00%. Physical Exam  Constitutional: She is oriented to person, place, and time. She appears well-developed and well-nourished. No distress.  HENT:  Head: Normocephalic and atraumatic.  Eyes: EOM are normal. Pupils are equal, round, and reactive to light. Right eye exhibits no discharge. Left eye exhibits no discharge. No scleral icterus.  Neck: Normal range of motion. Neck supple. JVD present. No tracheal deviation present. No thyromegaly present.  Cardiovascular: Normal rate and regular rhythm.  Exam reveals gallop. Exam reveals no friction rub.   No murmur heard. S3 present  Respiratory: Effort normal and breath sounds normal. No stridor. No respiratory distress. She has no wheezes. She has no rales. She exhibits no tenderness.  GI: Soft. Bowel sounds are normal. She exhibits no distension. There is no tenderness. There is no rebound and no guarding.  Musculoskeletal: Normal range of motion. She exhibits edema. She exhibits no tenderness.  Neurological: She is alert and oriented to person, place, and time.  Skin: No rash noted. She is not diaphoretic. No erythema.  Psychiatric: She has a normal mood and affect.     Assessment/Plan  1.  Acute on Chronic Systolic Heart Failure, LVEF 25-30% (TTE 04/2012), NYHA class 3-4: etiology is probably secondary to dietary indiscretion from eating larger sodium load.  She has received Lasix  80 mg i.v and currently feels better although not quite to her baseline at this time.  We will admit to cardiology service for intravenous diuresis with Lasix 80 mg i.v bid with potassium supplementation.  We will obtain serial cardiac markers to rule out MI, and consider obtaining TTE when she is well-diuresis. We will re-start her home beta-blockers and ACE-I.  2. IDDM: We will restart the patient on her home insulin regimen.  3. CAD s/p CABG x 5 in 2007.  Patient is on Aspirin, beta-blockers, and statin which will be continued  4.  COPD: patient has no active wheezing, but will re-start home nebulizer treatment .    Oseas Detty E 09/07/2012, 11:51 PM

## 2012-09-07 NOTE — ED Provider Notes (Signed)
Pt is a 62 y/o female with hx of CHF - severe by history, HCM with low EF - def and has had worsening sob this week, severe orthopnea today.  On exam she has very decreased air movement, speaks in shortened sentences, distant heart sounds but no tachycardia.  She is on chronic O2 at home and is not hypoxic at this time.  No peripheral edema.  ECG with LBBB, labs, CXR, admit for likley CHF - no signs of acute ischemia on ECG.  Medical screening examination/treatment/procedure(s) were conducted as a shared visit with non-physician practitioner(s) and myself.  I personally evaluated the patient during the encounter.  Clinical Impression: CHF      Vida Roller, MD 09/08/12 1724

## 2012-09-08 LAB — MRSA PCR SCREENING: MRSA by PCR: NEGATIVE

## 2012-09-08 LAB — GLUCOSE, CAPILLARY
Glucose-Capillary: 134 mg/dL — ABNORMAL HIGH (ref 70–99)
Glucose-Capillary: 261 mg/dL — ABNORMAL HIGH (ref 70–99)
Glucose-Capillary: 40 mg/dL — CL (ref 70–99)
Glucose-Capillary: 53 mg/dL — ABNORMAL LOW (ref 70–99)
Glucose-Capillary: 89 mg/dL (ref 70–99)

## 2012-09-08 LAB — BASIC METABOLIC PANEL
CO2: 37 mEq/L — ABNORMAL HIGH (ref 19–32)
Chloride: 100 mEq/L (ref 96–112)
Glucose, Bld: 181 mg/dL — ABNORMAL HIGH (ref 70–99)
Sodium: 143 mEq/L (ref 135–145)

## 2012-09-08 MED ORDER — INSULIN ASPART 100 UNIT/ML ~~LOC~~ SOLN
60.0000 [IU] | Freq: Three times a day (TID) | SUBCUTANEOUS | Status: DC
  Administered 2012-09-08: 40 [IU] via SUBCUTANEOUS
  Administered 2012-09-08: 60 [IU] via SUBCUTANEOUS
  Administered 2012-09-09: 30 [IU] via SUBCUTANEOUS
  Administered 2012-09-09: 50 [IU] via SUBCUTANEOUS

## 2012-09-08 MED ORDER — SODIUM CHLORIDE 0.9 % IJ SOLN
3.0000 mL | Freq: Two times a day (BID) | INTRAMUSCULAR | Status: DC
  Administered 2012-09-08 – 2012-09-10 (×6): 3 mL via INTRAVENOUS

## 2012-09-08 MED ORDER — ONDANSETRON HCL 4 MG/2ML IJ SOLN
4.0000 mg | Freq: Four times a day (QID) | INTRAMUSCULAR | Status: DC | PRN
  Filled 2012-09-08: qty 2

## 2012-09-08 MED ORDER — CARVEDILOL 25 MG PO TABS
25.0000 mg | ORAL_TABLET | Freq: Two times a day (BID) | ORAL | Status: DC
  Administered 2012-09-08 – 2012-09-11 (×7): 25 mg via ORAL
  Filled 2012-09-08 (×9): qty 1

## 2012-09-08 MED ORDER — INSULIN GLARGINE 100 UNIT/ML ~~LOC~~ SOLN
90.0000 [IU] | Freq: Every day | SUBCUTANEOUS | Status: DC
  Administered 2012-09-08 – 2012-09-10 (×3): 90 [IU] via SUBCUTANEOUS
  Filled 2012-09-08 (×5): qty 0.9

## 2012-09-08 MED ORDER — ACETAMINOPHEN 325 MG PO TABS
650.0000 mg | ORAL_TABLET | ORAL | Status: DC | PRN
  Filled 2012-09-08: qty 2

## 2012-09-08 MED ORDER — ENOXAPARIN SODIUM 60 MG/0.6ML ~~LOC~~ SOLN
60.0000 mg | SUBCUTANEOUS | Status: DC
  Administered 2012-09-08 – 2012-09-10 (×3): 60 mg via SUBCUTANEOUS
  Filled 2012-09-08 (×4): qty 0.6

## 2012-09-08 MED ORDER — FERROUS SULFATE 325 (65 FE) MG PO TABS
325.0000 mg | ORAL_TABLET | Freq: Every day | ORAL | Status: DC
  Administered 2012-09-08 – 2012-09-11 (×4): 325 mg via ORAL
  Filled 2012-09-08 (×5): qty 1

## 2012-09-08 MED ORDER — ASPIRIN EC 81 MG PO TBEC
81.0000 mg | DELAYED_RELEASE_TABLET | Freq: Every day | ORAL | Status: DC
  Administered 2012-09-08 – 2012-09-11 (×4): 81 mg via ORAL
  Filled 2012-09-08 (×4): qty 1

## 2012-09-08 MED ORDER — FUROSEMIDE 10 MG/ML IJ SOLN
80.0000 mg | Freq: Two times a day (BID) | INTRAMUSCULAR | Status: DC
  Administered 2012-09-08 – 2012-09-10 (×6): 80 mg via INTRAVENOUS
  Filled 2012-09-08 (×9): qty 8

## 2012-09-08 MED ORDER — ATORVASTATIN CALCIUM 20 MG PO TABS
20.0000 mg | ORAL_TABLET | Freq: Every day | ORAL | Status: DC
  Administered 2012-09-08 – 2012-09-11 (×4): 20 mg via ORAL
  Filled 2012-09-08 (×4): qty 1

## 2012-09-08 MED ORDER — ALBUTEROL SULFATE HFA 108 (90 BASE) MCG/ACT IN AERS: 2.0000 | INHALATION_SPRAY | Freq: Four times a day (QID) | RESPIRATORY_TRACT | Status: DC | PRN

## 2012-09-08 MED ORDER — SODIUM CHLORIDE 0.9 % IJ SOLN: 3.0000 mL | INTRAMUSCULAR | Status: DC | PRN

## 2012-09-08 MED ORDER — SODIUM CHLORIDE 0.9 % IV SOLN: 250.0000 mL | INTRAVENOUS | Status: DC | PRN

## 2012-09-08 MED ORDER — LISINOPRIL 10 MG PO TABS
10.0000 mg | ORAL_TABLET | Freq: Every day | ORAL | Status: DC
  Administered 2012-09-08 – 2012-09-11 (×5): 10 mg via ORAL
  Filled 2012-09-08 (×5): qty 1

## 2012-09-08 MED ORDER — DEXTROSE 50 % IV SOLN
INTRAVENOUS | Status: AC
  Administered 2012-09-08: 25 mL
  Filled 2012-09-08: qty 50

## 2012-09-08 MED ORDER — DEXTROSE 50 % IV SOLN: 25.0000 mL | Freq: Once | INTRAVENOUS | Status: AC

## 2012-09-08 MED ORDER — POTASSIUM CHLORIDE CRYS ER 20 MEQ PO TBCR
20.0000 meq | EXTENDED_RELEASE_TABLET | Freq: Two times a day (BID) | ORAL | Status: DC
  Administered 2012-09-08 – 2012-09-11 (×7): 20 meq via ORAL
  Filled 2012-09-08 (×9): qty 1

## 2012-09-08 NOTE — Progress Notes (Addendum)
CBG 42. Checked CBG per patient request, stating dizziness. Gave orange juice. Hypoglycemic Event  CBG: 42   Treatment: orange juice, graham crackers and PB   Symptoms: Shaky  Follow-up CBG: Time:1550 CBG Result: 56  Possible Reasons for Event: Medication regimen: Pt. taking home dose of novolog at 60 units, modified diet while in hospital setting.   Comments/MD notified: Paged Dr. Katrinka Blazing. Current CBG 53 (third check).     Ozella Rocks P  Remember to initiate Hypoglycemia Order Set & complete

## 2012-09-08 NOTE — Progress Notes (Addendum)
Physician notification: Card Master re CBG 134 this AM. Scheduled 60 units novolog, pt. Requesting 40 units due to lower CBG.  Awaiting return response.   1610 Paged [place] at [phone]. Awaiting return response.  Not with [facility].  0940 Attempted to call [facility] office to page Dr. Anne Fu about patient insulin.  Office paging Dr. Katrinka Blazing. Awaiting return response.   1008 No return response. Gave 40 units insulin as patient refused full dose of 60 units novolog. Will continue to monitor.   1035 Dr. Katrinka Blazing returned call. OK to give 40 units insulin.

## 2012-09-08 NOTE — Progress Notes (Signed)
Paged and spoke with Dr. Katha Cabal of [facility] regarding pt's HTN (SBP 170s-180s). AM regular Coreg and Lisinopril ordered for now. Will administer and reassess BP 1 hour later.

## 2012-09-08 NOTE — Progress Notes (Signed)
Patient Name: [name] Date of Encounter: 09/08/2012    SUBJECTIVE: Feels better. Breathing improved. Gradual increasing shortness of breath with accelerated symptomatology after eating a large can of chicken noodles soup on the day of admission. There was also associated chest discomfort. She has a history of coronary artery disease with prior bypass grafting, ischemic cardiomyopathy with low EF, and AICD. No pain at this time. Respiratory status is back to baseline. She is on chronic O2 at home.  TELEMETRY:  Paced rhythm at 63 beats per minute: Filed Vitals:   09/08/12 0446 09/08/12 0700 09/08/12 0745 09/08/12 1141  BP:   126/54 144/61  Pulse:      Temp:  98 F (36.7 C)  97.8 F (36.6 C)  TempSrc:  Oral  Oral  Resp:      Height:      Weight: 121.3 kg (267 lb 6.7 oz)     SpO2:        Intake/Output Summary (Last 24 hours) at 09/08/12 1319 Last data filed at 09/08/12 1235  Gross per 24 hour  Intake    480 ml  Output   2025 ml  Net  -1545 ml    LABS: Basic Metabolic Panel:  Recent Labs  25/95/63 2126 09/08/12 0525  NA 140 143  K 4.8 3.9  CL 101 100  CO2 30 37*  GLUCOSE 238* 181*  BUN 9 9  CREATININE 0.66 0.73  CALCIUM 9.3 9.3   CBC:  Recent Labs  09/07/12 2126  WBC 5.9  HGB 14.1  HCT 42.4  MCV 92.0  PLT 176   BNP (last 3 results)  Recent Labs  01/30/12 2336 05/30/12 1239 09/07/12 2110  PROBNP 893.8* 472.3* 1025.0*    ECHOCARDIOGRAM:  04/24/12 ------------------------------------------------------------ LV EF: 25% - 30%  ------------------------------------------------------------ Indications: CHF - 428.0.  ------------------------------------------------------------ History: PMH: Coronary artery disease. Hypertrophic cardiomyopathy. Risk factors: Hypotension. Acute renal failure. UTI. Encephalopathy. Weakness. Hypertension. Diabetes mellitus. Dyslipidemia.  ------------------------------------------------------------ Study  Conclusions  - Procedure narrative: Transthoracic echocardiography. Image quality was suboptimal. The study was technically difficult, as a result of poor acoustic windows, poor sound wave transmission, restricted patient mobility, and body habitus. - Left ventricle: Wall thickness was increased in a pattern of moderate LVH. Systolic function was severely reduced. The estimated ejection fraction was in the range of 25% to 30%. Diffuse hypokinesis. Probable akinesis of the basal-midanteroseptal myocardium. Features are consistent with a pseudonormal left ventricular filling pattern, with concomitant abnormal relaxation and increased filling pressure (grade 2 diastolic dysfunction). Doppler parameters are consistent with high ventricular filling pressure. - Left atrium: The atrium was moderately dilated. - Right ventricle: Possible pacer wire or catheter noted in right ventricle.  Radiology/Studies:   admitting chest x-ray consistent with pulmonary edema   Physical Exam: Blood pressure 144/61, pulse 63, temperature 97.8 F (36.6 C), temperature source Oral, resp. rate 22, height 5\' 4"  (1.626 m), weight 121.3 kg (267 lb 6.7 oz), SpO2 97.00%. Weight change:    Morbidly obese.  Unable to assess neck veins.  Decreased breath sounds at both bases. No rales or wheezing heard  Cardiac exam reveals distant sounds. No obvious murmur or gallop.  Trace ankle edema   ASSESSMENT:  1. Acute on chronic systolic heart failure ,  2. Coronary atherosclerotic heart disease without enzyme evidence of infarction.    Plan:  1. Continue IV diuresis.  2. Discussed sodium restriction  Selinda Eon 09/08/2012, 1:19 PM

## 2012-09-08 NOTE — ED Provider Notes (Signed)
Medical screening examination/treatment/procedure(s) were conducted as a shared visit with non-physician practitioner(s) and myself.  I personally evaluated the patient during the encounter  Please see my separate respective documentation pertaining to this patient encounter   Alexys Lobello D Adonis Yim, MD 09/08/12 1724 

## 2012-09-09 DIAGNOSIS — E162 Hypoglycemia, unspecified: Secondary | ICD-10-CM | POA: Diagnosis present

## 2012-09-09 HISTORY — DX: Hypoglycemia, unspecified: E16.2

## 2012-09-09 LAB — GLUCOSE, CAPILLARY
Glucose-Capillary: 173 mg/dL — ABNORMAL HIGH (ref 70–99)
Glucose-Capillary: 244 mg/dL — ABNORMAL HIGH (ref 70–99)

## 2012-09-09 LAB — BASIC METABOLIC PANEL
CO2: 37 mEq/L — ABNORMAL HIGH (ref 19–32)
Calcium: 9.6 mg/dL (ref 8.4–10.5)
Chloride: 97 mEq/L (ref 96–112)
Glucose, Bld: 210 mg/dL — ABNORMAL HIGH (ref 70–99)
Potassium: 4.1 mEq/L (ref 3.5–5.1)
Sodium: 139 mEq/L (ref 135–145)

## 2012-09-09 NOTE — Progress Notes (Signed)
  Echocardiogram 2D Echocardiogram has been performed.  Arvil Chaco 09/09/2012, 1:15 PM

## 2012-09-09 NOTE — Progress Notes (Signed)
Utilization review completed.  

## 2012-09-09 NOTE — Care Management Note (Signed)
    Page 1 of 1   09/09/2012     11:25:27 AM   CARE MANAGEMENT NOTE 09/09/2012  Patient:  [name], [name]   Account Number:  [id number]  Date Initiated:  09/09/2012  Documentation initiated by:  Donn Pierini  Subjective/Objective Assessment:   Pt admitted with CHF     Action/Plan:   PTA pt lived at home with son- has home 02 with [facility]   Anticipated DC Date:  09/11/2012   Anticipated DC Plan:  HOME/SELF CARE      DC Planning Services  CM consult      Choice offered to / List presented to:             Status of service:  In process, will continue to follow Medicare Important Message given?   (If response is "NO", the following Medicare IM given date fields will be blank) Date Medicare IM given:   Date Additional Medicare IM given:    Discharge Disposition:    Per UR Regulation:  Reviewed for med. necessity/level of care/duration of stay  If discussed at Long Length of Stay Meetings, dates discussed:    Comments:  09/09/12- 1115- Donn Pierini RN, BSN [phone] Referral for HH-HF screen received- in to speak with pt and family at bedside- per conversation pt states that she receives home 02 through [facility] and uses 2-3 L baseline. She also has a tub bench at home- she reports that she has had HH-RN in past for medication management and HF education- she states that she does not feel like she needs it again at this time. Family interested in [facility] services- explained to them process through DSS and PCP. NCM to cont. to follow - at this time pt declines any HH services.

## 2012-09-09 NOTE — Progress Notes (Signed)
Inpatient Diabetes Program Recommendations  AACE/ADA: New Consensus Statement on Inpatient Glycemic Control (2013)  Target Ranges:  Prepandial:   less than 140 mg/dL      Peak postprandial:   less than 180 mg/dL (1-2 hours)      Critically ill patients:  140 - 180 mg/dL   Reason for Visit: Results for [name], [name] (MRN [id number]) as of 09/09/2012 11:20  Ref. Range 09/08/2012 16:26 09/08/2012 16:48 09/08/2012 18:13 09/08/2012 21:46 09/09/2012 07:29  Glucose-Capillary Latest Range: 70-99 mg/dL 53 (L) 40 (LL) 89 409 (H) 167 (H)   Note hypoglycemic event on 09/08/12.  Patient is on very large doses of Novolog with meals- 60 units tid with meals.  Consider decreasing Meal time Novolog to 25 units tid with meals and add Novolog moderate correction tid with meals.

## 2012-09-09 NOTE — Progress Notes (Signed)
Subjective:  Breathing is better but not perfect she states. Had heart failure exacerbation after eating salty soup.  Echocardiogram currently being done in room. Ejection fraction appears 25-30%  Objective:  Vital Signs in the last 24 hours: Temp:  [97.8 F (36.6 C)-99.1 F (37.3 C)] 98 F (36.7 C) (09/02 0700) Pulse Rate:  [64-66] 66 (09/02 0447) Resp:  [28] 28 (09/01 2021) BP: (105-144)/(45-75) 118/53 mmHg (09/02 0728) SpO2:  [97 %] 97 % (09/01 2021) Weight:  [120.5 kg (265 lb 10.5 oz)] 120.5 kg (265 lb 10.5 oz) (09/02 0447)  Intake/Output from previous day: 09/01 0701 - 09/02 0700 In: 240 [P.O.:240] Out: 2100 [Urine:2100]   Physical Exam: General: Well developed, well nourished, in no acute distress. Overweight Head:  Normocephalic and atraumatic. Lungs: Clear to auscultation and percussion. Distant breath sounds Heart: Normal S1 and S2.  No murmur, rubs or gallops.  Abdomen: soft, non-tender, positive bowel sounds. Obese Extremities: No clubbing or cyanosis. Trace edema. Neurologic: Alert and oriented x 3.    Lab Results:  Recent Labs  09/07/12 2126  WBC 5.9  HGB 14.1  PLT 176    Recent Labs  09/08/12 0525 09/09/12 0433  NA 143 139  K 3.9 4.1  CL 100 97  CO2 37* 37*  GLUCOSE 181* 210*  BUN 9 15  CREATININE 0.73 0.78    Imaging: Dg Chest Port 1 View  09/07/2012   *RADIOLOGY REPORT*  Clinical Data: Chest pain, shortness of breath  PORTABLE CHEST - 1 VIEW  Comparison: Prior radiograph from 05/30/2012  Findings: Left-sided dual lead pacemaker / AICD is stable in position.  Median sternotomy wires remain intact.  Cardiomegaly is grossly stable.  The lungs remain hypoinflated.  There is diffuse pulmonary vascular congestion with interstitial edema.  Small right pleural effusion is present.  No definite focal airspace consolidation identified. There is no pneumothorax.  No acute osseous abnormality.  IMPRESSION: Cardiomegaly with diffuse pulmonary edema.    Original Report Authenticated By: Rise Mu, M.D.   Personally viewed.   Telemetry: No adverse arrhythmias Personally viewed.   EKG:  No ST segment changes  Cardiac Studies:  Echo from 4/14 EF 25%. Bedside echo today await formal read, appears to be 25% once again.  Assessment/Plan:   62 year old with the following issues:  1. Acute on chronic systolic heart failure  -Appears improved this morning. BNP slightly elevated to 1025 from prior admission for 172.  - Continue with IV Lasix today. BUN up from 9-15.  - Continue with lisinopril 10 mg, carvedilol 25 mg twice a day.  2. Ischemic cardiomyopathy  - ICD in place  - Beta blocker and ACE inhibitor in place  - IV furosemide.  - Hopeful transition to by mouth tomorrow and potential discharge.  3. Diabetes  - Hypoglycemia noted. Decreased ins.  4. Obesity   - Encourage weight loss  5. Coronary artery disease  - Bypass 2007.  We will transfer to telemetry.    [name] 09/09/2012, 10:39 AM

## 2012-09-09 NOTE — Progress Notes (Addendum)
Paged physician on call through office. Dr. Mayford Knife re: FYI pt. Refusal of ordered dose insulin, pt. Only wanting to take 30 units novolog due to hypoglyemic event yesterday.  Awaiting return response.   4540: spoke with Dr. Mayford Knife in person. OK with patient request of insulin dosage.

## 2012-09-09 NOTE — Progress Notes (Signed)
Pt. Transferred by wheelchair by NT and 2 liters oxygen. Family member at bedside. All belongings sent with patient. Monitored in route, will be on tele. Vital signs stable.

## 2012-09-10 LAB — BASIC METABOLIC PANEL
BUN: 17 mg/dL (ref 6–23)
CO2: 37 mEq/L — ABNORMAL HIGH (ref 19–32)
Chloride: 100 mEq/L (ref 96–112)
GFR calc non Af Amer: 69 mL/min — ABNORMAL LOW (ref >90)
Glucose, Bld: 195 mg/dL — ABNORMAL HIGH (ref 70–99)
Potassium: 4.7 mEq/L (ref 3.5–5.1)
Sodium: 144 mEq/L (ref 135–145)

## 2012-09-10 LAB — GLUCOSE, CAPILLARY: Glucose-Capillary: 343 mg/dL — ABNORMAL HIGH (ref 70–99)

## 2012-09-10 MED ORDER — INSULIN ASPART 100 UNIT/ML ~~LOC~~ SOLN
0.0000 [IU] | Freq: Three times a day (TID) | SUBCUTANEOUS | Status: DC
  Administered 2012-09-10: 11 [IU] via SUBCUTANEOUS
  Administered 2012-09-10: 2 [IU] via SUBCUTANEOUS
  Administered 2012-09-10: 3 [IU] via SUBCUTANEOUS

## 2012-09-10 MED ORDER — INSULIN ASPART 100 UNIT/ML ~~LOC~~ SOLN
25.0000 [IU] | Freq: Three times a day (TID) | SUBCUTANEOUS | Status: DC
  Administered 2012-09-10 (×3): 25 [IU] via SUBCUTANEOUS

## 2012-09-10 NOTE — Progress Notes (Signed)
Subjective:  Breathing better.   Objective:  Vital Signs in the last 24 hours: Temp:  [98 F (36.7 C)-98.4 F (36.9 C)] 98 F (36.7 C) (09/03 0501) Pulse Rate:  [60-64] 64 (09/03 0501) Resp:  [18] 18 (09/03 0501) BP: (123-135)/(54-69) 123/56 mmHg (09/03 0501) SpO2:  [96 %-98 %] 98 % (09/03 0501) Weight:  [119.9 kg (264 lb 5.3 oz)] 119.9 kg (264 lb 5.3 oz) (09/03 0501)  Intake/Output from previous day: 09/02 0701 - 09/03 0700 In: 3 [I.V.:3] Out: 200 [Urine:200]   Physical Exam: General: Well developed, well nourished, in no acute distress. Head:  Normocephalic and atraumatic. Lungs: Clear to auscultation and percussion.Distant BS Heart: Normal S1 and S2.  No murmur, rubs or gallops.  Abdomen: soft, non-tender, positive bowel sounds. Extremities: No clubbing or cyanosis. No edema. Neurologic: Alert and oriented x 3.    Lab Results:  Recent Labs  09/07/12 2126  WBC 5.9  HGB 14.1  PLT 176    Recent Labs  09/09/12 0433 09/10/12 0540  NA 139 144  K 4.1 4.7  CL 97 100  CO2 37* 37*  GLUCOSE 210* 195*  BUN 15 17  CREATININE 0.78 0.88   .   Telemetry: no adverse rhythm Personally viewed.    Cardiac Studies:  EF 25% no changes from prior  Assessment/Plan:  Active Problems:   Ischemic cardiomyopathy   CAD (coronary artery disease)   Hypertension   Acute on chronic systolic heart failure   HLD (hyperlipidemia)   Diabetes   Hypoglycemia   - Continue IV lasix today  - hopeful dc tomorrow  - breathing improved  - decreased novolog  - no angina  - Creat stable  - ICD stable    [name] 09/10/2012, 8:20 AM

## 2012-09-10 NOTE — Progress Notes (Addendum)
Giving IV lasix and IV infiltrated. Pt received 20mg  prior to infiltrate. Zanetta Dehaan RN IV team restarted IV, Jacklyn Shell RN gave the remaining 60mg  lasix. Emelda Brothers RN

## 2012-09-11 LAB — GLUCOSE, CAPILLARY: Glucose-Capillary: 104 mg/dL — ABNORMAL HIGH (ref 70–99)

## 2012-09-11 MED ORDER — LISINOPRIL 10 MG PO TABS: 10.0000 mg | ORAL_TABLET | Freq: Every day | ORAL | Status: DC

## 2012-09-11 MED ORDER — ASPIRIN 81 MG PO TBEC: 81.0000 mg | DELAYED_RELEASE_TABLET | Freq: Every day | ORAL | Status: DC

## 2012-09-11 MED ORDER — FUROSEMIDE 80 MG PO TABS: 80.0000 mg | ORAL_TABLET | Freq: Two times a day (BID) | ORAL | Status: DC

## 2012-09-11 MED ORDER — FUROSEMIDE 80 MG PO TABS
80.0000 mg | ORAL_TABLET | Freq: Two times a day (BID) | ORAL | Status: DC
  Administered 2012-09-11: 80 mg via ORAL
  Filled 2012-09-11 (×3): qty 1

## 2012-09-11 MED ORDER — POTASSIUM CHLORIDE CRYS ER 20 MEQ PO TBCR: 20.0000 meq | EXTENDED_RELEASE_TABLET | Freq: Two times a day (BID) | ORAL | Status: DC

## 2012-09-11 NOTE — Discharge Summary (Signed)
Patient ID: [name] MRN: [id number] DOB/AGE: February 15, 1950 62 y.o.  Admit date: 09/07/2012 Discharge date: 09/11/2012  Primary Discharge Diagnosis: Acute on chronic systolic heart failure Secondary Discharge Diagnosis: Ischemic cardiomyopathy, morbid obesity, sleep apnea, ICD, hypertension  Significant Diagnostic Studies: Echocardiogram-ejection fraction remains 25-30%. No change.  Consults: None  Hospital Course: History of present illness: 09/07/12- 62 y/o female with a PMH s/p CABG x 5 10/02/2005 with LIMA-->D2, SVG-->RCA, SVG-->intermediate artery with sequential-->OM1 and sequential-->OM2, ICM (LVEF 25-30% by TTE 04/2012) presenting for evaluation of chest pain and shortness of breath. She is normally on chronic 2 L oxygen continuously at home and is normally only able to walk around the house before she gets shortness of breath. She reports eating a large can of Chicken Noodle soup about 4 pm on the day of presentation. About 3 hours after eating the large can of Chicken Noodle soup, she began to experience increasing shortness of breath to the point where she had to increase her oxygen level from 2 L to 3 L to feel comfortable breathing. In addition, she started to experience chest discomfort which she described as tightness 4/10 in severity associated with diaphoresis and shortness of breath. She denies nausea/vomiting, radiation, dizziness or syncope. She has baseline 2 pillow orthopnea which has not changed. On presentation, her EKG obtained 09/07/2012 at 21:18 showed sinus rhythm (heart rate 72 bpm) with LBBB. Her chest X-ray is consistent with pulmonary edema. Her first set of cardiac marker is unremarkable. She has been given Lasix 80 mg i.v. Although she does feel better, she is not quite back to her normal baseline yet. She is hemodynamically stable. She denies weight changes. Last self-reported weight last week is 268 pounds. She is yet to be weighed today.  During hospitalization, she  was diuresed with 80 mg of Lasix IV twice a day. Overall successful. Decrease shortness of breath. Able to ambulate without difficulty.  She had an episode of hypoglycemia which was corrected after decreasing her NovoLog from 60 down to 25 units with meals. She may need to adjust this back at home as well.  Net urine output approximately 2.5 L. Weight approximately 121 kg. Creatinine 0.88 BNP on admission 1025, troponin normal.  Discharge Exam: Blood pressure 115/50, pulse 64, temperature 98.4 F (36.9 C), temperature source Axillary, resp. rate 20, height 5\' 4"  (1.626 m), weight 121.383 kg (267 lb 9.6 oz), SpO2 96.00%.    General: Alert and oriented x3 in no acute distress Cardio vascular: Regular rate and rhythm no appreciable JVD Lungs: Clear to auscultation, distant breath sounds, no wheezes Abdomen: Obese, positive bowel sounds Extremities: No edema present Neurologic: Moves all extremities. Labs:   Lab Results  Component Value Date   WBC 5.9 09/07/2012   HGB 14.1 09/07/2012   HCT 42.4 09/07/2012   MCV 92.0 09/07/2012   PLT 176 09/07/2012    Recent Labs Lab 09/10/12 0540  NA 144  K 4.7  CL 100  CO2 37*  BUN 17  CREATININE 0.88  CALCIUM 9.9  GLUCOSE 195*   Lab Results  Component Value Date   CKTOTAL 59 04/23/2012   CKMB 1.6 04/23/2012   TROPONINI <0.30 04/24/2012    Lab Results  Component Value Date   CHOL 138 04/25/2012   CHOL 192 03/24/2011   CHOL  Value: 130        ATP III CLASSIFICATION:  <200     mg/dL   Desirable  846-962  mg/dL   Borderline High  >=952  mg/dL   High        16/10/9602   Lab Results  Component Value Date   HDL 33* 04/25/2012   HDL 34* 03/24/2011   HDL 28* 12/19/2009   Lab Results  Component Value Date   LDLCALC 82 04/25/2012   LDLCALC 140* 03/24/2011   LDLCALC  Value: 83        Total Cholesterol/HDL:CHD Risk Coronary Heart Disease Risk Table                     Men   Women  1/2 Average Risk   3.4   3.3  Average Risk       5.0   4.4  2 X  Average Risk   9.6   7.1  3 X Average Risk  23.4   11.0        Use the calculated Patient Ratio above and the CHD Risk Table to determine the patient's CHD Risk.        ATP III CLASSIFICATION (LDL):  <100     mg/dL   Optimal  540-981  mg/dL   Near or Above                    Optimal  130-159  mg/dL   Borderline  191-478  mg/dL   High  >295     mg/dL   Very High 62/13/0865   Lab Results  Component Value Date   TRIG 113 04/25/2012   TRIG 92 03/24/2011   TRIG 97 12/19/2009   Lab Results  Component Value Date   CHOLHDL 4.2 04/25/2012   CHOLHDL 5.6 03/24/2011   CHOLHDL 4.6 12/19/2009   No results found for this basename: LDLDIRECT       FOLLOW UP PLANS AND APPOINTMENTS Discharge Orders   Future Appointments Provider Department Dept Phone   09/24/2012 10:45 AM Hillis Range, MD [facility] Main [facility] [place]) [phone]   Future Orders Complete By Expires   Diet - low sodium heart healthy  As directed    Increase activity slowly  As directed        Medication List    STOP taking these medications       aspirin 325 MG tablet  Replaced by:  aspirin 81 MG EC tablet      TAKE these medications       albuterol 108 (90 BASE) MCG/ACT inhaler  Commonly known as:  PROVENTIL HFA;VENTOLIN HFA  Inhale 2 puffs into the lungs every 6 (six) hours as needed for wheezing.     aspirin 81 MG EC tablet  Take 1 tablet (81 mg total) by mouth daily.     atorvastatin 20 MG tablet  Commonly known as:  LIPITOR  Take 20 mg by mouth daily.     carvedilol 25 MG tablet  Commonly known as:  COREG  Take 0.5 tablets (12.5 mg total) by mouth 2 (two) times daily with a meal.     ferrous sulfate 325 (65 FE) MG tablet  Take 325 mg by mouth daily with breakfast.     furosemide 80 MG tablet  Commonly known as:  LASIX  Take 1 tablet (80 mg total) by mouth 2 (two) times daily.     HUMALOG 100 UNIT/ML injection  Generic drug:  insulin lispro  Inject 60 Units into the skin 3 (three) times daily after  meals.     LANTUS 100 UNIT/ML injection  Generic drug:  insulin glargine  Inject 90  Units into the skin at bedtime.     lisinopril 10 MG tablet  Commonly known as:  PRINIVIL,ZESTRIL  Take 1 tablet (10 mg total) by mouth daily.     OVER THE COUNTER MEDICATION  Apply 1 application topically daily as needed (hydrocortisone cream:apply to breast area for redness and itching).     potassium chloride SA 20 MEQ tablet  Commonly known as:  K-DUR,KLOR-CON  Take 1 tablet (20 mEq total) by mouth 2 (two) times daily.     WELCHOL 3.75 G Pack  Generic drug:  Colesevelam HCl  Take 1 packet by mouth daily. Mix packet with water or juice           Follow-up Information   Follow up with Donato Schultz, MD On 09/23/2012. (11am)    Specialty:  Cardiology   Contact information:   [address] [place] [address] [phone]       BRING ALL MEDICATIONS WITH YOU TO FOLLOW UP APPOINTMENTS  Time spent with patient to include physician time: 35 minutes spent with patient, instructions, clear instructions to avoid salt. Prior medical noncompliance. Review of medical records, appointment.  At clinic followup, we'll check basic metabolic profile. May need to adjust Lasix. Also, she may need to adjust her insulin and regimen. Primary care physician.  SignedDonato Schultz 09/11/2012, 8:59 AM

## 2012-09-24 ENCOUNTER — Encounter: Payer: Self-pay | Admitting: *Deleted

## 2012-09-24 ENCOUNTER — Encounter: Payer: Self-pay | Admitting: Internal Medicine

## 2012-09-24 ENCOUNTER — Ambulatory Visit (INDEPENDENT_AMBULATORY_CARE_PROVIDER_SITE_OTHER): Payer: Medicaid Other | Admitting: Internal Medicine

## 2012-09-24 ENCOUNTER — Telehealth: Payer: Self-pay | Admitting: Internal Medicine

## 2012-09-24 ENCOUNTER — Encounter (HOSPITAL_COMMUNITY): Payer: Self-pay | Admitting: Pharmacy Technician

## 2012-09-24 VITALS — BP 135/71 | HR 64 | Ht 64.0 in | Wt 267.8 lb

## 2012-09-24 DIAGNOSIS — I255 Ischemic cardiomyopathy: Secondary | ICD-10-CM

## 2012-09-24 DIAGNOSIS — I447 Left bundle-branch block, unspecified: Secondary | ICD-10-CM

## 2012-09-24 DIAGNOSIS — I519 Heart disease, unspecified: Secondary | ICD-10-CM

## 2012-09-24 DIAGNOSIS — I2589 Other forms of chronic ischemic heart disease: Secondary | ICD-10-CM

## 2012-09-24 DIAGNOSIS — I5023 Acute on chronic systolic (congestive) heart failure: Secondary | ICD-10-CM

## 2012-09-24 DIAGNOSIS — I2510 Atherosclerotic heart disease of native coronary artery without angina pectoris: Secondary | ICD-10-CM

## 2012-09-24 HISTORY — DX: Heart disease, unspecified: I51.9

## 2012-09-24 HISTORY — DX: Left bundle-branch block, unspecified: I44.7

## 2012-09-24 LAB — ICD DEVICE OBSERVATION
AL IMPEDENCE ICD: 584 Ohm
CHARGE TIME: 10.069 s
PACEART VT: 0
RV LEAD AMPLITUDE: 9.0374 mv
RV LEAD IMPEDENCE ICD: 416 Ohm
TOT-0001: 1
TOT-0002: 0
TZAT-0001ATACH: 1
TZAT-0001ATACH: 2
TZAT-0001ATACH: 3
TZAT-0001FASTVT: 1
TZAT-0001SLOWVT: 1
TZAT-0001SLOWVT: 2
TZAT-0002ATACH: NEGATIVE
TZAT-0002FASTVT: NEGATIVE
TZAT-0012ATACH: 150 ms
TZAT-0012ATACH: 150 ms
TZAT-0013SLOWVT: 1
TZAT-0013SLOWVT: 1
TZAT-0018ATACH: NEGATIVE
TZAT-0018ATACH: NEGATIVE
TZAT-0018ATACH: NEGATIVE
TZAT-0018FASTVT: NEGATIVE
TZAT-0018SLOWVT: NEGATIVE
TZAT-0018SLOWVT: NEGATIVE
TZAT-0019ATACH: 6 V
TZAT-0019SLOWVT: 8 V
TZAT-0020ATACH: 1.5 ms
TZAT-0020SLOWVT: 1.5 ms
TZAT-0020SLOWVT: 1.5 ms
TZON-0004SLOWVT: 32
TZON-0004VSLOWVT: 20
TZON-0005SLOWVT: 12
TZST-0001ATACH: 4
TZST-0001ATACH: 5
TZST-0001ATACH: 6
TZST-0001FASTVT: 3
TZST-0001FASTVT: 4
TZST-0001FASTVT: 6
TZST-0001SLOWVT: 3
TZST-0001SLOWVT: 5
TZST-0001SLOWVT: 6
TZST-0002ATACH: NEGATIVE
TZST-0002ATACH: NEGATIVE
TZST-0002ATACH: NEGATIVE
TZST-0002FASTVT: NEGATIVE
TZST-0002FASTVT: NEGATIVE
TZST-0003SLOWVT: 35 J
TZST-0003SLOWVT: 35 J

## 2012-09-24 LAB — CBC WITH DIFFERENTIAL/PLATELET
Basophils Absolute: 0 10*3/uL (ref 0.0–0.1)
HCT: 39.8 % (ref 36.0–46.0)
Hemoglobin: 13 g/dL (ref 12.0–15.0)
Lymphs Abs: 1.9 10*3/uL (ref 0.7–4.0)
MCHC: 32.7 g/dL (ref 30.0–36.0)
Monocytes Relative: 9.4 % (ref 3.0–12.0)
Neutro Abs: 3.5 10*3/uL (ref 1.4–7.7)
RDW: 14.8 % — ABNORMAL HIGH (ref 11.5–14.6)

## 2012-09-24 LAB — BASIC METABOLIC PANEL
CO2: 35 mEq/L — ABNORMAL HIGH (ref 19–32)
Chloride: 96 mEq/L (ref 96–112)
GFR: 94.67 mL/min (ref >60.00)
Glucose, Bld: 180 mg/dL — ABNORMAL HIGH (ref 70–99)
Potassium: 4.7 mEq/L (ref 3.5–5.1)
Sodium: 140 mEq/L (ref 135–145)

## 2012-09-24 MED ORDER — GENTAMICIN SULFATE 40 MG/ML IJ SOLN
80.0000 mg | INTRAMUSCULAR | Status: DC
  Filled 2012-09-24: qty 2

## 2012-09-24 MED ORDER — DEXTROSE 5 % IV SOLN
3.0000 g | INTRAVENOUS | Status: DC
  Filled 2012-09-24: qty 3000

## 2012-09-24 NOTE — Progress Notes (Signed)
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 female who presents today for electrophysiology followup.  She was recently hospitalized for congestive heart failure.   She reports chronic SOB.   Today, she denies symptoms of palpitations, chest pain,  dizziness, presyncope, syncope, or ICD shocks.  The patient is otherwise without complaint today.   Past Medical History  Diagnosis Date  . Coronary artery disease     s/p CABG 2007  . Sleep apnea     uses O2 at night  . Ischemic cardiomyopathy     EF 30-35%, s/p ICD 4/08 by Dr Amil Amen  . Diabetes mellitus     type II  . Hypertension   . Obesity   . CHF (congestive heart failure)   . Oxygen dependent 2 L   Past Surgical History  Procedure Laterality Date  . Cardiac catheterization    . Cardiac defibrillator placement  4/08    by Dr Amil Amen ([facility])  . Coronary artery bypass graft  2007  . Implantable cardioverter defibrillator implant      Current Outpatient Prescriptions  Medication Sig Dispense Refill  . albuterol (PROVENTIL HFA;VENTOLIN HFA) 108 (90 BASE) MCG/ACT inhaler Inhale 2 puffs into the lungs every 6 (six) hours as needed for wheezing.      Marland Kitchen aspirin EC 81 MG EC tablet Take 1 tablet (81 mg total) by mouth daily.  30 tablet  12  . atorvastatin (LIPITOR) 20 MG tablet Take 20 mg by mouth daily.       . carvedilol (COREG) 25 MG tablet Take 0.5 tablets (12.5 mg total) by mouth 2 (two) times daily with a meal.  30 tablet  0  . Colesevelam HCl [facility]) 3.75 G PACK Take 1 packet by mouth daily. Mix packet with water or juice      . ferrous sulfate 325 (65 FE) MG tablet Take 325 mg by mouth daily with breakfast.      . furosemide (LASIX) 80 MG tablet Take 1 tablet (80 mg total) by mouth 2 (two) times daily.  60 tablet  12  . insulin glargine (LANTUS) 100 UNIT/ML injection Inject 90 Units into the skin at bedtime.       . insulin lispro (HUMALOG) 100 UNIT/ML injection Inject 60 Units  into the skin 3 (three) times daily after meals.       Marland Kitchen lisinopril (PRINIVIL,ZESTRIL) 10 MG tablet Take 1 tablet (10 mg total) by mouth daily.  30 tablet  12  . OVER THE COUNTER MEDICATION Apply 1 application topically daily as needed (hydrocortisone cream:apply to breast area for redness and itching).      . potassium chloride SA (K-DUR,KLOR-CON) 20 MEQ tablet Take 1 tablet (20 mEq total) by mouth 2 (two) times daily.  60 tablet  12   No current facility-administered medications for this visit.    Physical Exam: Filed Vitals:   09/24/12 1058  BP: 135/71  Pulse: 64  Height: 5\' 4"  (1.626 m)  Weight: 267 lb 12.8 oz (121.473 kg)    GEN- The patient is chronically ill and obese appearing, alert and oriented x 3 today.   Head- normocephalic, atraumatic Eyes-  Sclera clear, conjunctiva pink Ears- hearing intact Oropharynx- clear Lungs- Clear to ausculation bilaterally, normal work of breathing Chest- ICD pocket is well healed Heart- Regular rate and rhythm, no murmurs, rubs or gallops, PMI not laterally displaced GI- soft, NT, ND, + BS Extremities- no clubbing, cyanosis, +2 edema  Neuro- strength/ sensation are intact  ICD interrogation- reviewed in detail today,  See PACEART report ekg today reveals sinus rhythm 63 bpm, PR 190, LBBB (QRS )  Assessment and Plan:  1.  Chronic systolic dysfunction The patient has chronic systolic dysfunction, NYHA Class III CHF, and LBBB.  I had a long discussion with the patient today about potential upgrade of her device to a Biventricular ICD.  Risks, benefits, alternatives to BiV ICD upgrade were discussed in detail with the patient today. The patient  understands that the risks include but are not limited to bleeding, infection, pneumothorax, perforation, tamponade, vascular damage, renal failure, MI, stroke, death, inappropriate shocks, and lead dislodgement and wishes to proceed.  We will therefore schedule device upgrade at the next  available time. Normal ICD function.  See Pace Art report No changes today  2 CAD/ ischemic CM No ischemic symptoms Continue medical therapy  3. LBBB As above

## 2012-09-24 NOTE — Patient Instructions (Addendum)
Will upgrade your device on 10/07/12  See instruction sheet

## 2012-09-24 NOTE — Telephone Encounter (Signed)
Spoke with patient and we are going to reschedule to 10/09/12  5:30am/7:30 case  Patient aware of time.

## 2012-09-24 NOTE — Telephone Encounter (Signed)
Patient called back and wants to go tomorrow.  I have arranged for her to do so.  She will arrive at 11am

## 2012-09-24 NOTE — Telephone Encounter (Signed)
Patient wants to r/s her procedures for 10-07-12

## 2012-09-25 ENCOUNTER — Encounter (HOSPITAL_COMMUNITY)
Admission: RE | Disposition: A | Discharge status: Home / Self Care | Payer: Medicaid Other | Source: Ambulatory Visit | Attending: Internal Medicine

## 2012-09-25 ENCOUNTER — Ambulatory Visit (HOSPITAL_COMMUNITY)
Admission: RE | Admit: 2012-09-25 | Discharge: 2012-09-26 | Disposition: A | Discharge status: Home / Self Care | Payer: Medicaid Other | Source: Ambulatory Visit | Attending: Internal Medicine | Admitting: Internal Medicine

## 2012-09-25 DIAGNOSIS — I10 Essential (primary) hypertension: Secondary | ICD-10-CM | POA: Insufficient documentation

## 2012-09-25 DIAGNOSIS — I255 Ischemic cardiomyopathy: Secondary | ICD-10-CM

## 2012-09-25 DIAGNOSIS — I447 Left bundle-branch block, unspecified: Secondary | ICD-10-CM | POA: Diagnosis present

## 2012-09-25 DIAGNOSIS — I2589 Other forms of chronic ischemic heart disease: Secondary | ICD-10-CM | POA: Insufficient documentation

## 2012-09-25 DIAGNOSIS — I5023 Acute on chronic systolic (congestive) heart failure: Secondary | ICD-10-CM

## 2012-09-25 DIAGNOSIS — E119 Type 2 diabetes mellitus without complications: Secondary | ICD-10-CM | POA: Insufficient documentation

## 2012-09-25 DIAGNOSIS — I5022 Chronic systolic (congestive) heart failure: Secondary | ICD-10-CM | POA: Insufficient documentation

## 2012-09-25 DIAGNOSIS — E669 Obesity, unspecified: Secondary | ICD-10-CM | POA: Insufficient documentation

## 2012-09-25 DIAGNOSIS — Z951 Presence of aortocoronary bypass graft: Secondary | ICD-10-CM | POA: Insufficient documentation

## 2012-09-25 DIAGNOSIS — I509 Heart failure, unspecified: Secondary | ICD-10-CM | POA: Insufficient documentation

## 2012-09-25 DIAGNOSIS — G4730 Sleep apnea, unspecified: Secondary | ICD-10-CM | POA: Insufficient documentation

## 2012-09-25 DIAGNOSIS — I2510 Atherosclerotic heart disease of native coronary artery without angina pectoris: Secondary | ICD-10-CM | POA: Diagnosis present

## 2012-09-25 DIAGNOSIS — I519 Heart disease, unspecified: Secondary | ICD-10-CM | POA: Diagnosis present

## 2012-09-25 DIAGNOSIS — Z79899 Other long term (current) drug therapy: Secondary | ICD-10-CM | POA: Insufficient documentation

## 2012-09-25 HISTORY — PX: BI-VENTRICULAR IMPLANTABLE CARDIOVERTER DEFIBRILLATOR UPGRADE: SHX5461

## 2012-09-25 HISTORY — PX: IMPLANTABLE CARDIOVERTER DEFIBRILLATOR IMPLANT: SHX5860

## 2012-09-25 LAB — GLUCOSE, CAPILLARY: Glucose-Capillary: 238 mg/dL — ABNORMAL HIGH (ref 70–99)

## 2012-09-25 SURGERY — BI-VENTRICULAR IMPLANTABLE CARDIOVERTER DEFIBRILLATOR UPGRADE
Anesthesia: LOCAL

## 2012-09-25 MED ORDER — LISINOPRIL 10 MG PO TABS
10.0000 mg | ORAL_TABLET | Freq: Every day | ORAL | Status: DC
  Administered 2012-09-25 – 2012-09-26 (×2): 10 mg via ORAL
  Filled 2012-09-25 (×2): qty 1

## 2012-09-25 MED ORDER — HYDROCODONE-ACETAMINOPHEN 5-325 MG PO TABS
1.0000 | ORAL_TABLET | ORAL | Status: DC | PRN
  Administered 2012-09-26: 1 via ORAL
  Filled 2012-09-25: qty 1

## 2012-09-25 MED ORDER — SODIUM CHLORIDE 0.9 % IJ SOLN: 3.0000 mL | Freq: Two times a day (BID) | INTRAMUSCULAR | Status: DC

## 2012-09-25 MED ORDER — FENTANYL CITRATE 0.05 MG/ML IJ SOLN
INTRAMUSCULAR | Status: AC
  Filled 2012-09-25: qty 2

## 2012-09-25 MED ORDER — CEFAZOLIN SODIUM 1-5 GM-% IV SOLN
1.0000 g | Freq: Four times a day (QID) | INTRAVENOUS | Status: AC
  Administered 2012-09-25 – 2012-09-26 (×3): 1 g via INTRAVENOUS
  Filled 2012-09-25 (×3): qty 50

## 2012-09-25 MED ORDER — HEPARIN (PORCINE) IN NACL 2-0.9 UNIT/ML-% IJ SOLN
INTRAMUSCULAR | Status: AC
  Filled 2012-09-25: qty 500

## 2012-09-25 MED ORDER — MUPIROCIN 2 % EX OINT
TOPICAL_OINTMENT | CUTANEOUS | Status: AC
  Filled 2012-09-25: qty 22

## 2012-09-25 MED ORDER — ALBUTEROL SULFATE HFA 108 (90 BASE) MCG/ACT IN AERS
2.0000 | INHALATION_SPRAY | Freq: Four times a day (QID) | RESPIRATORY_TRACT | Status: DC | PRN
  Filled 2012-09-25: qty 6.7

## 2012-09-25 MED ORDER — FERROUS SULFATE 325 (65 FE) MG PO TABS
325.0000 mg | ORAL_TABLET | Freq: Every day | ORAL | Status: DC
  Administered 2012-09-26: 325 mg via ORAL
  Filled 2012-09-25 (×2): qty 1

## 2012-09-25 MED ORDER — SODIUM CHLORIDE 0.9 % IV SOLN
INTRAVENOUS | Status: DC
  Administered 2012-09-25: 12:00:00 via INTRAVENOUS

## 2012-09-25 MED ORDER — ONDANSETRON HCL 4 MG/2ML IJ SOLN: 4.0000 mg | Freq: Four times a day (QID) | INTRAMUSCULAR | Status: DC | PRN

## 2012-09-25 MED ORDER — ACETAMINOPHEN 325 MG PO TABS
325.0000 mg | ORAL_TABLET | ORAL | Status: DC | PRN
  Administered 2012-09-26 (×2): 650 mg via ORAL
  Filled 2012-09-25 (×2): qty 2

## 2012-09-25 MED ORDER — POTASSIUM CHLORIDE CRYS ER 20 MEQ PO TBCR
20.0000 meq | EXTENDED_RELEASE_TABLET | Freq: Every day | ORAL | Status: DC
  Administered 2012-09-25: 20 meq via ORAL
  Filled 2012-09-25 (×2): qty 1

## 2012-09-25 MED ORDER — CHLORHEXIDINE GLUCONATE 4 % EX LIQD
60.0000 mL | Freq: Once | CUTANEOUS | Status: DC
  Filled 2012-09-25: qty 60

## 2012-09-25 MED ORDER — SODIUM CHLORIDE 0.9 % IV SOLN: 250.0000 mL | INTRAVENOUS | Status: DC | PRN

## 2012-09-25 MED ORDER — MIDAZOLAM HCL 5 MG/5ML IJ SOLN
INTRAMUSCULAR | Status: AC
  Filled 2012-09-25: qty 5

## 2012-09-25 MED ORDER — LIDOCAINE HCL (PF) 1 % IJ SOLN
INTRAMUSCULAR | Status: AC
  Filled 2012-09-25: qty 60

## 2012-09-25 MED ORDER — MUPIROCIN 2 % EX OINT
TOPICAL_OINTMENT | Freq: Once | CUTANEOUS | Status: DC
  Filled 2012-09-25: qty 22

## 2012-09-25 MED ORDER — INSULIN ASPART 100 UNIT/ML ~~LOC~~ SOLN
2.0000 [IU] | Freq: Three times a day (TID) | SUBCUTANEOUS | Status: DC
  Administered 2012-09-25 – 2012-09-26 (×2): 2 [IU] via SUBCUTANEOUS

## 2012-09-25 MED ORDER — INSULIN ASPART 100 UNIT/ML ~~LOC~~ SOLN: 2.0000 [IU] | Freq: Three times a day (TID) | SUBCUTANEOUS | Status: DC

## 2012-09-25 MED ORDER — ASPIRIN EC 81 MG PO TBEC
81.0000 mg | DELAYED_RELEASE_TABLET | Freq: Every day | ORAL | Status: DC
  Administered 2012-09-25 – 2012-09-26 (×2): 81 mg via ORAL
  Filled 2012-09-25 (×2): qty 1

## 2012-09-25 MED ORDER — ATORVASTATIN CALCIUM 80 MG PO TABS
80.0000 mg | ORAL_TABLET | Freq: Every day | ORAL | Status: DC
  Filled 2012-09-25: qty 1

## 2012-09-25 MED ORDER — INSULIN GLARGINE 100 UNIT/ML ~~LOC~~ SOLN
90.0000 [IU] | Freq: Every day | SUBCUTANEOUS | Status: DC
  Administered 2012-09-25: 21:00:00 90 [IU] via SUBCUTANEOUS
  Filled 2012-09-25 (×2): qty 0.9

## 2012-09-25 MED ORDER — FUROSEMIDE 80 MG PO TABS
80.0000 mg | ORAL_TABLET | Freq: Two times a day (BID) | ORAL | Status: DC
  Administered 2012-09-25 – 2012-09-26 (×2): 80 mg via ORAL
  Filled 2012-09-25 (×4): qty 1

## 2012-09-25 MED ORDER — CARVEDILOL 12.5 MG PO TABS
12.5000 mg | ORAL_TABLET | Freq: Two times a day (BID) | ORAL | Status: DC
  Administered 2012-09-26: 09:00:00 12.5 mg via ORAL
  Filled 2012-09-25 (×2): qty 1

## 2012-09-25 MED ORDER — SODIUM CHLORIDE 0.9 % IJ SOLN: 3.0000 mL | INTRAMUSCULAR | Status: DC | PRN

## 2012-09-25 NOTE — Interval H&P Note (Signed)
History and Physical Interval Note:  ICD Criteria  Current LVEF (within 6 months):25%  NYHA Functional Classification: Class III  Heart Failure History:  Yes, Duration of heart failure since onset is > 9 months  Non-Ischemic Dilated Cardiomyopathy History:  No.  Atrial Fibrillation/Atrial Flutter:  No.  Ventricular Tachycardia History:  No.  Cardiac Arrest History:  No  History of Syndromes with Risk of Sudden Death:  No.  Previous ICD:  Yes, ICD Type:  Dual, Reason for ICD:  Primary prevention.  30%  Electrophysiology Study: No.  Anticoagulation Therapy:  Patient is NOT on anticoagulation therapy.   Beta Blocker Therapy:  Yes.   Ace Inhibitor/ARB Therapy:  Yes.    09/25/2012 1:08 PM  [name]  has presented today for surgery, with the diagnosis of hf  The various methods of treatment have been discussed with the patient and family. After consideration of risks, benefits and other options for treatment, the patient has consented to  Procedure(s): BI-VENTRICULAR IMPLANTABLE CARDIOVERTER DEFIBRILLATOR UPGRADE (N/A) as a surgical intervention .  The patient's history has been reviewed, patient examined, no change in status, stable for surgery.  I have reviewed the patient's chart and labs.  Questions were answered to the patient's satisfaction.     Hillis Range

## 2012-09-25 NOTE — Progress Notes (Signed)
Chaplain responded to a request to visit the patient and/or patient's family. Chaplain consulted with nursing in the Cath Lab. The patient and the patient's family have no pastoral needs at this time per nursing. Chaplain asked nursing to follow if pastoral services was needed or requested later.   09/25/12 1600  Clinical Encounter Type  Visited With Patient not available;Health care provider  Visit Type Initial  Referral From Chaplain

## 2012-09-25 NOTE — H&P (View-Only) (Signed)
PCP: Millsaps, KIMBERLY M, NP Primary Cardiologist:  Dr Skains  [name] is a 62 y.o. female who presents today for electrophysiology followup.  She was recently hospitalized for congestive heart failure.   She reports chronic SOB.   Today, she denies symptoms of palpitations, chest pain,  dizziness, presyncope, syncope, or ICD shocks.  The patient is otherwise without complaint today.   Past Medical History  Diagnosis Date  . Coronary artery disease     s/p CABG 2007  . Sleep apnea     uses O2 at night  . Ischemic cardiomyopathy     EF 30-35%, s/p ICD 4/08 by Dr Edmunds  . Diabetes mellitus     type II  . Hypertension   . Obesity   . CHF (congestive heart failure)   . Oxygen dependent 2 L   Past Surgical History  Procedure Laterality Date  . Cardiac catheterization    . Cardiac defibrillator placement  4/08    by Dr Edmunds ([facility])  . Coronary artery bypass graft  2007  . Implantable cardioverter defibrillator implant      Current Outpatient Prescriptions  Medication Sig Dispense Refill  . albuterol (PROVENTIL HFA;VENTOLIN HFA) 108 (90 BASE) MCG/ACT inhaler Inhale 2 puffs into the lungs every 6 (six) hours as needed for wheezing.      . aspirin EC 81 MG EC tablet Take 1 tablet (81 mg total) by mouth daily.  30 tablet  12  . atorvastatin (LIPITOR) 20 MG tablet Take 20 mg by mouth daily.       . carvedilol (COREG) 25 MG tablet Take 0.5 tablets (12.5 mg total) by mouth 2 (two) times daily with a meal.  30 tablet  0  . Colesevelam HCl (WELCHOL) 3.75 G PACK Take 1 packet by mouth daily. Mix packet with water or juice      . ferrous sulfate 325 (65 FE) MG tablet Take 325 mg by mouth daily with breakfast.      . furosemide (LASIX) 80 MG tablet Take 1 tablet (80 mg total) by mouth 2 (two) times daily.  60 tablet  12  . insulin glargine (LANTUS) 100 UNIT/ML injection Inject 90 Units into the skin at bedtime.       . insulin lispro (HUMALOG) 100 UNIT/ML injection Inject 60 Units  into the skin 3 (three) times daily after meals.       . lisinopril (PRINIVIL,ZESTRIL) 10 MG tablet Take 1 tablet (10 mg total) by mouth daily.  30 tablet  12  . OVER THE COUNTER MEDICATION Apply 1 application topically daily as needed (hydrocortisone cream:apply to breast area for redness and itching).      . potassium chloride SA (K-DUR,KLOR-CON) 20 MEQ tablet Take 1 tablet (20 mEq total) by mouth 2 (two) times daily.  60 tablet  12   No current facility-administered medications for this visit.    Physical Exam: Filed Vitals:   09/24/12 1058  BP: 135/71  Pulse: 64  Height: 5' 4" (1.626 m)  Weight: 267 lb 12.8 oz (121.473 kg)    GEN- The patient is chronically ill and obese appearing, alert and oriented x 3 today.   Head- normocephalic, atraumatic Eyes-  Sclera clear, conjunctiva pink Ears- hearing intact Oropharynx- clear Lungs- Clear to ausculation bilaterally, normal work of breathing Chest- ICD pocket is well healed Heart- Regular rate and rhythm, no murmurs, rubs or gallops, PMI not laterally displaced GI- soft, NT, ND, + BS Extremities- no clubbing, cyanosis, +2 edema   Neuro- strength/ sensation are intact  ICD interrogation- reviewed in detail today,  See PACEART report ekg today reveals sinus rhythm 63 bpm, PR 190, LBBB (QRS 1444msec)  Assessment and Plan:  1.  Chronic systolic dysfunction The patient has chronic systolic dysfunction, NYHA Class III CHF, and LBBB.  I had a long discussion with the patient today about potential upgrade of her device to a Biventricular ICD.  Risks, benefits, alternatives to BiV ICD upgrade were discussed in detail with the patient today. The patient  understands that the risks include but are not limited to bleeding, infection, pneumothorax, perforation, tamponade, vascular damage, renal failure, MI, stroke, death, inappropriate shocks, and lead dislodgement and wishes to proceed.  We will therefore schedule device upgrade at the next  available time. Normal ICD function.  See Pace Art report No changes today  2 CAD/ ischemic CM No ischemic symptoms Continue medical therapy  3. LBBB As above  

## 2012-09-25 NOTE — Brief Op Note (Signed)
Attempted biventricular upgrade of her ICD was unsuccessful today See dictation for full report

## 2012-09-26 ENCOUNTER — Ambulatory Visit (HOSPITAL_COMMUNITY): Payer: Medicaid Other

## 2012-09-26 ENCOUNTER — Encounter (HOSPITAL_COMMUNITY): Payer: Self-pay | Admitting: General Practice

## 2012-09-26 DIAGNOSIS — I5023 Acute on chronic systolic (congestive) heart failure: Secondary | ICD-10-CM

## 2012-09-26 LAB — BASIC METABOLIC PANEL
BUN: 14 mg/dL (ref 6–23)
Chloride: 98 mEq/L (ref 96–112)
Creatinine, Ser: 0.76 mg/dL (ref 0.50–1.10)
GFR calc Af Amer: >90 mL/min (ref >90)
Glucose, Bld: 185 mg/dL — ABNORMAL HIGH (ref 70–99)

## 2012-09-26 MED ORDER — SPIRONOLACTONE 25 MG PO TABS
25.0000 mg | ORAL_TABLET | Freq: Every day | ORAL | Status: DC
  Administered 2012-09-26: 25 mg via ORAL
  Filled 2012-09-26: qty 1

## 2012-09-26 MED ORDER — SPIRONOLACTONE 25 MG PO TABS: 25.0000 mg | ORAL_TABLET | Freq: Every day | ORAL | Status: DC

## 2012-09-26 NOTE — Op Note (Signed)
NAMEKAILAH, [name] NO.:  [id number]  MEDICAL RECORD NO.:  [id number]  LOCATION:  6C05C                        FACILITY:  [facility]  PHYSICIAN:  Hillis Range, MD       DATE OF BIRTH:  08-Mar-1950  DATE OF PROCEDURE:  09/25/2012 DATE OF DISCHARGE:                              OPERATIVE REPORT   SURGEON:  Hillis Range, MD  PREPROCEDURE DIAGNOSES: 1. Chronic systolic dysfunction. 2. Ischemic cardiomyopathy. 3. Coronary artery disease. 4. Left bundle-branch block. 5. New York Heart Association class III congestive heart failure.  POSTPROCEDURE DIAGNOSES: 1. Chronic systolic dysfunction. 2. Ischemic cardiomyopathy. 3. Coronary artery disease. 4. Left bundle-branch block. 5. New York Heart Association class III congestive heart failure.  PROCEDURES: 1. Attempted biventricular implantable cardioverter-defibrillator     upgrade. 2. Generator replacement with skin pocket revision. 3. Left upper extremity venography.  INTRODUCTION:  [name] is a pleasant 62 year old female with a history of an ischemic cardiomyopathy (ejection fraction 25%), New York Heart Association class III congestive heart failure, and left bundle- branch block.  She presents today for biventricular ICD upgrade.  DESCRIPTION OF PROCEDURE:  Informed written consent was obtained and the patient was brought to the electrophysiology lab in the fasting state. She was adequately sedated with intravenous Versed and fentanyl as outlined in the nursing report.  The patient's ICD was interrogated and confirmed to have less than 12 months battery remaining with a voltage of 2.78 V.  Tachy therapies were programed off.  The patient's left chest was prepped and draped in usual sterile fashion by the EP lab staff.  The skin overlying her existing defibrillator was infiltrated with lidocaine for local analgesia.  A 4-cm incision was made over the pocket.  The pocket was exposed using a combination of  sharp and blunt dissection.  Electrocautery was required to assure hemostasis.  The patient's existing Medtronic ICD was removed from the pocket.  A single #2 silk suture was identified and removed.  There was no other foreign matter or debris within the pocket.  The atrial and ventricular leads were carefully freed within the pocket.  The atrial lead was confirmed to be a Medtronic model [id number] (serial [id number]) lead implanted on May 01, 2006.  The right ventricular defibrillator lead was confirmed to be a Medtronic model [id number] (serial [id number]) lead also implanted on May 01, 2006.  Both leads were examined and their integrity was confirmed to be intact.  Atrial lead P-waves measured 1.5 mV with impedance of 475 ohms and a threshold of 0.7 V at 0.5 milliseconds. Right ventricular lead R-waves measured 17 mV with impedance of 400 ohms and a threshold of 0.5 V at 0.5 milliseconds.  The shock impedance was 56 ohms proximally and 57 ohms distally with a combined impedance of 36 ohms.  A venogram of the left upper extremity was performed, which revealed a large and patent left axillary and subclavian veins.  The left axillary vein was therefore cannulated.  Through the left axillary vein, a Medtronic MB2 guide was advanced into the low right atrium.  A 6- French curved hexapolar Damato catheter was introduced through the MB2 guide and advanced into the coronary  sinus.  A selective balloon angiogram was performed by hand-injection of nonionic contrast.  This demonstrated 3 separate branches to the coronary sinus.  There is a distal lateral branch.  This appeared to be rather standard.  There was a more proximal very tortuous middle branch which had a corkscrew ostium.  There was a very small proximal branch of the coronary sinus. The selective balloon was removed.  The distal lateral branch of the coronary sinus was accessed using a whisper CSJ wire.  A [facility] [place], [place]  [address] lead was advanced over the whisper CSJ wire into this distal branch.  The lead was easily advanced into the lateral branch.  Mapping was extensively performed within this branch along the lateral wall of the left ventricle.  Unfortunately, there was not a single threshold, but low 5 V with multiple vectors attempted. Therefore, this vein was felt to not be suitable for pacing due to very poor pacing thresholds.  I then cannulated the middle branch with the wire.  Unfortunately, due to the corkscrew ostium, the Quartet lead could not be advanced distally into this branch.  Finally, the proximal coronary sinus branch was engaged with both whisper and Mailman wires. Unfortunately, the Quartet lead could not be advanced into this branch because it was too small and would not accommodate the lead.  After 3 hours of attempting to obtain adequate left ventricular lead placement, it was felt that the procedure should be discontinued.  The MB2 guide was therefore removed and the Quartet lead was also removed.  Gentle manual pressure was held and a single silk stitch was placed to assure hemostasis.  As the existing device has less than 12 months of estimated longevity, I elected to replace the device.  I elected to replace it with a [facility], model [id number] (serial [id number]) defibrillator.  This is a biventricular defibrillator which will allow [name] to either come back and attempt the procedure again in the future or to consider epicardial lead access placement.  The existing Medtronic [id number] and [id number] leads were therefore attached to this device and the LV port was plugged.  The pocket was irrigated with copious gentamicin solution.  The defibrillator was then placed into the pocket. The pocket was then closed in 2 layers with 2-0 Vicryl suture for the subcutaneous and subcuticular layers.  Steri-Strips and a sterile dressing were then applied.  There were no early  apparent complications.  CONCLUSIONS: 1. Biventricular upgrade attempted with a [employer]     Assura device placed.  Unfortunately, adequate left ventricular     lead placement could not be obtained today. 2. No early apparent complications. 3. DFT testing was not performed today.     Hillis Range, MD     JA/MEDQ  D:  09/25/2012  T:  09/26/2012  Job:  [id number]  cc:   Jake Bathe, MD Fax: [phone]

## 2012-09-26 NOTE — Discharge Summary (Signed)
ELECTROPHYSIOLOGY PROCEDURE DISCHARGE SUMMARY    Patient ID: [name],  MRN: [id number], DOB/AGE: [birth year] 62 y.o.  Admit date: 09/25/2012 Discharge date: 09/26/2012  Primary Care Physician: Marva Panda, NP Primary Cardiologist: Donato Schultz, MD Electrophysiologist: Hillis Range, MD  Primary Discharge Diagnosis:  Ischemic cardiomyopathy and congestive heart failure status post defibrillator generator change this admission with attempted CRT upgrade  Secondary Discharge Diagnosis:  1.  CAD- s/p CABG 2007 2.  Sleep apnea 3.  Type II diabetes 4.  Hypertension 5.  Obesity  Procedures This Admission:  1.  Defibrillator generator change with attempted placement of LV lead on 09-25-2012 by Dr Johney Frame.  The patient received a STJ Unify Assura CRTD with the LV port capped.  Due to poor venous targets, a LV lead was not able to be placed. There were no early apparent complications.   Brief HPI: [name] is a 62 year old female with a history of an ischemic cardiomyopathy (ejection fraction 25%), New York Heart Association class III congestive heart failure, and left bundle- branch block. She has had recurrent heart failure admissions and was considered appropriate for CRT upgrade.  Risks, benefits, and alternatives were reviewed with the patient who wished to proceed.   Hospital Course:  The patient was admitted and underwent attempted upgrade of her previously implanted ICD to a CRTD with details as outlined above.  Due to poor venous targets, a LV lead was not able to be placed.  She underwent generator change with a STJ Unify Assura CRTD with the LV port capped.  She was monitored on telemetry overnight which demonstrated sinus rhythm.  Left chest was without hematoma or ecchymosis.  Wound care, arm mobility, and restrictions were reviewed with the patient.  Dr Johney Frame examined the patient and considered them stable for discharge to home.    Physical Exam: Filed Vitals:   09/25/12 2200 09/26/12 0000 09/26/12 0400 09/26/12 0751  BP: 129/53 111/52 131/54 132/51  Pulse: 67 70 68 65  Temp:  97.5 F (36.4 C) 98.6 F (37 C) 98 F (36.7 C)  TempSrc:  Oral Oral Oral  Resp:  22 20 20   Height:      Weight:  269 lb 10 oz (122.3 kg)    SpO2: 99% 98% 93% 96%    GEN- The patient is overweight appearing, alert and oriented x 3 today.  Wearing O2 Head- normocephalic, atraumatic Eyes-  Sclera clear, conjunctiva pink Ears- hearing intact Oropharynx- clear Neck- supple  Lungs- Clear to ausculation bilaterally, normal work of breathing Chest- ICD pocket is without hematoma Heart- Regular rate and rhythm  GI- soft, NT, ND, + BS Extremities- no clubbing, cyanosis, or edema   Labs:   Lab Results  Component Value Date   WBC 6.2 09/24/2012   HGB 13.0 09/24/2012   HCT 39.8 09/24/2012   MCV 89.9 09/24/2012   PLT 192.0 09/24/2012     Recent Labs Lab 09/26/12 0615  NA 138  K 4.8  CL 98  CO2 30  BUN 14  CREATININE 0.76  CALCIUM 9.2  GLUCOSE 185*     Discharge Medications:    Medication List    ASK your doctor about these medications       albuterol 108 (90 BASE) MCG/ACT inhaler  Commonly known as:  PROVENTIL HFA;VENTOLIN HFA  Inhale 2 puffs into the lungs every 6 (six) hours as needed for wheezing.     aspirin 81 MG EC tablet  Take 1 tablet (81 mg total) by  mouth daily.     atorvastatin 20 MG tablet  Commonly known as:  LIPITOR  Take 80 mg by mouth daily.     carvedilol 25 MG tablet  Commonly known as:  COREG  Take 0.5 tablets (12.5 mg total) by mouth 2 (two) times daily with a meal.     ferrous sulfate 325 (65 FE) MG tablet  Take 325 mg by mouth daily with breakfast.     furosemide 80 MG tablet  Commonly known as:  LASIX  Take 1 tablet (80 mg total) by mouth 2 (two) times daily.     HUMALOG 100 UNIT/ML injection  Generic drug:  insulin lispro  Inject 60 Units into the skin 3 (three) times daily before meals.     LANTUS 100 UNIT/ML  injection  Generic drug:  insulin glargine  Inject 90 Units into the skin at bedtime.     lisinopril 10 MG tablet  Commonly known as:  PRINIVIL,ZESTRIL  Take 1 tablet (10 mg total) by mouth daily.     OVER THE COUNTER MEDICATION  - Apply 1 application topically daily as needed (apply to breast area for redness and itching). Medication:Hydrocortisone Cream  - apply to breast area for redness and itching     potassium chloride SA 20 MEQ tablet  Commonly known as:  K-DUR,KLOR-CON  Take 20 mEq by mouth daily.     WELCHOL 3.75 G Pack  Generic drug:  Colesevelam HCl  Take 1 packet by mouth daily. Mix packet with water or juice        Disposition:   Future Appointments Provider Department Dept Phone   10/09/2012 2:00 PM [facility] [employer] Main Office [place]) [phone]   12/24/2012 10:45 AM Donato Schultz, MD [facility] [phone]   01/07/2013 10:15 AM Hillis Range, MD [facility] Main Office [place]) [phone]     Follow-up Information   Follow up with [facility] On 10/09/2012. (At 2:00 PM for wound check)    Specialty:  Electrophysiology   Contact information:   [address], [address] [place] [address] [phone]      Follow up with Hillis Range, MD On 01/07/2013. (At 10:15 AM)    Specialty:  Cardiology   Contact information:   [address] [place] [address] [phone]       Duration of Discharge Encounter: Greater than 30 minutes including physician time.  Signed, Hillis Range MD

## 2012-09-26 NOTE — Progress Notes (Signed)
Subjective:  Laying comfortably.   Objective:  Vital Signs in the last 24 hours: Temp:  [97.5 F (36.4 C)-98.6 F (37 C)] 98 F (36.7 C) (09/19 0751) Pulse Rate:  [60-73] 65 (09/19 0751) Resp:  [18-22] 20 (09/19 0751) BP: (111-149)/(40-76) 132/51 mmHg (09/19 0751) SpO2:  [93 %-100 %] 96 % (09/19 0751) Weight:  [121.11 kg (267 lb)-122.3 kg (269 lb 10 oz)] 122.3 kg (269 lb 10 oz) (09/19 0000)  Intake/Output from previous day: 09/18 0701 - 09/19 0700 In: 700 [P.O.:600; IV Piggyback:100] Out: 350 [Urine:350]   Physical Exam: General: Well developed, well nourished, in no acute distress. Head:  Normocephalic and atraumatic. Lungs: Clear to auscultation and percussion. Heart: Normal S1 and S2.  No murmur, rubs or gallops.  Abdomen: soft, non-tender, positive bowel sounds. Obese Extremities: No clubbing or cyanosis. No edema. Neurologic: Alert and oriented x 3.    Lab Results:  Recent Labs  09/24/12 1155  WBC 6.2  HGB 13.0  PLT 192.0    Recent Labs  09/24/12 1155 09/26/12 0615  NA 140 138  K 4.7 4.8  CL 96 98  CO2 35* 30  GLUCOSE 180* 185*  BUN 11 14  CREATININE 0.8 0.76   Assessment/Plan:  Active Problems:   CAD (coronary artery disease)   LBBB (left bundle branch block)   Chronic systolic dysfunction of left ventricle  1) Chronic systolic heart failure  - Reviewed medications  - Will add spironolactone 25mg  PO QD  - Stop KCL supplement (K 4.7, 4.8 on 20 KCL QD with lasix 80 bid)  - Continue coreg 12.5 BID, lisinopril 10 QD  Will f/u 10/29.   2) LBBB  - unable to place Bi-V lead  3) CAD  - no angina  [name] 09/26/2012, 8:11 AM

## 2012-10-09 ENCOUNTER — Ambulatory Visit (INDEPENDENT_AMBULATORY_CARE_PROVIDER_SITE_OTHER): Payer: Medicaid Other | Admitting: *Deleted

## 2012-10-09 DIAGNOSIS — I255 Ischemic cardiomyopathy: Secondary | ICD-10-CM

## 2012-10-09 DIAGNOSIS — I519 Heart disease, unspecified: Secondary | ICD-10-CM

## 2012-10-09 DIAGNOSIS — I2589 Other forms of chronic ischemic heart disease: Secondary | ICD-10-CM

## 2012-10-09 LAB — ICD DEVICE OBSERVATION
AL IMPEDENCE ICD: 437.5 Ohm
ATRIAL PACING ICD: 0.03 pct
BAMS-0001: 150 {beats}/min
BAMS-0003: 60 {beats}/min
DEV-0020ICD: NEGATIVE
TOT-0007: 0
TOT-0008: 0
TZAT-0001FASTVT: 1
TZAT-0001SLOWVT: 1
TZAT-0004FASTVT: 8
TZAT-0004SLOWVT: 8
TZAT-0012FASTVT: 200 ms
TZAT-0018FASTVT: NEGATIVE
TZAT-0019FASTVT: 7.5 V
TZON-0003FASTVT: 300 ms
TZON-0003SLOWVT: 350 ms
TZON-0004FASTVT: 30
TZON-0010SLOWVT: 40 ms
TZST-0001FASTVT: 3
TZST-0001FASTVT: 4
TZST-0001SLOWVT: 2
TZST-0001SLOWVT: 3
TZST-0003FASTVT: 36 J
TZST-0003FASTVT: 40 J
TZST-0003SLOWVT: 36 J
VENTRICULAR PACING ICD: 0 pct

## 2012-10-09 NOTE — Progress Notes (Signed)

## 2012-10-30 ENCOUNTER — Encounter: Payer: Self-pay | Admitting: Internal Medicine

## 2012-10-30 ENCOUNTER — Other Ambulatory Visit: Payer: Self-pay | Admitting: Pharmacist

## 2012-11-06 ENCOUNTER — Ambulatory Visit (INDEPENDENT_AMBULATORY_CARE_PROVIDER_SITE_OTHER): Payer: Medicaid Other | Admitting: Cardiology

## 2012-11-06 ENCOUNTER — Encounter: Payer: Self-pay | Admitting: Cardiology

## 2012-11-06 VITALS — BP 134/76 | HR 68 | Ht 64.0 in | Wt 263.0 lb

## 2012-11-06 DIAGNOSIS — I208 Other forms of angina pectoris: Secondary | ICD-10-CM

## 2012-11-06 DIAGNOSIS — I2510 Atherosclerotic heart disease of native coronary artery without angina pectoris: Secondary | ICD-10-CM

## 2012-11-06 DIAGNOSIS — I519 Heart disease, unspecified: Secondary | ICD-10-CM

## 2012-11-06 DIAGNOSIS — I255 Ischemic cardiomyopathy: Secondary | ICD-10-CM

## 2012-11-06 DIAGNOSIS — I447 Left bundle-branch block, unspecified: Secondary | ICD-10-CM

## 2012-11-06 DIAGNOSIS — I2089 Other forms of angina pectoris: Secondary | ICD-10-CM

## 2012-11-06 DIAGNOSIS — E785 Hyperlipidemia, unspecified: Secondary | ICD-10-CM

## 2012-11-06 DIAGNOSIS — I2589 Other forms of chronic ischemic heart disease: Secondary | ICD-10-CM

## 2012-11-06 DIAGNOSIS — I10 Essential (primary) hypertension: Secondary | ICD-10-CM

## 2012-11-06 DIAGNOSIS — E119 Type 2 diabetes mellitus without complications: Secondary | ICD-10-CM

## 2012-11-06 HISTORY — DX: Morbid (severe) obesity due to excess calories: E66.01

## 2012-11-06 HISTORY — DX: Other forms of angina pectoris: I20.89

## 2012-11-06 HISTORY — DX: Other forms of angina pectoris: I20.8

## 2012-11-06 MED ORDER — ISOSORBIDE MONONITRATE ER 30 MG PO TB24: 30.0000 mg | ORAL_TABLET | Freq: Every day | ORAL | Status: DC

## 2012-11-06 NOTE — Patient Instructions (Addendum)
Your physician has recommended you make the following change in your medication:   1. Start Isosorbide 30 mg once daily  Your physician recommends that you schedule a follow-up appointment in: 1 month with Dr. Anne Fu

## 2012-11-06 NOTE — Progress Notes (Signed)
[address], [address] [place], [place]  [address] Phone: [phone] Fax:  [phone]  Date:  11/06/2012   ID:  [name], DOB 1950-05-05, MRN [id number]  PCP:  Egbert Garibaldi, NP   History of Present Illness: [name] is a 62 y.o. female schematic cardiomyopathy EF 25-30%, ICD, CABG 2007, cardiac catheterization December of 2011 with widely patent grafts, stress test 2013 large defect prior infarct pattern, EF 31%.  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  11/06/12-does complain about angina with exertion. Overall though she is feeling quite well. She still wishes she could breathe better.   Wt Readings from Last 3 Encounters:  11/06/12 263 lb (119.296 kg)  09/26/12 269 lb 10 oz (122.3 kg)  09/26/12 269 lb 10 oz (122.3 kg)     Past Medical History  Diagnosis Date  . Coronary artery disease     s/p CABG 2007  . Sleep apnea     uses O2 at night  . Ischemic cardiomyopathy     EF 30-35%, s/p ICD 4/08 by Dr Amil Amen  . Diabetes mellitus     type II  . Hypertension   . Obesity   . CHF (congestive heart failure)   . Oxygen dependent 2 L    Past Surgical History  Procedure Laterality Date  . Cardiac catheterization    . Cardiac defibrillator placement  4/08    by Dr Amil Amen (MDT)  . Coronary artery bypass graft  2007  . Implantable cardioverter defibrillator implant      Current Outpatient Prescriptions  Medication Sig Dispense Refill  . albuterol (PROVENTIL HFA;VENTOLIN HFA) 108 (90 BASE) MCG/ACT inhaler Inhale 2 puffs into the lungs every 6 (six) hours as needed for wheezing.      Marland Kitchen aspirin EC 81 MG EC tablet Take 1 tablet (81 mg total) by mouth daily.  30 tablet  12  . atorvastatin (LIPITOR) 20 MG tablet Take 80 mg by mouth daily.       . carvedilol (COREG) 25 MG tablet Take 0.5 tablets (12.5 mg total) by mouth 2 (two) times daily with a meal.  30 tablet  0  . Colesevelam HCl [facility]) 3.75 G PACK Take 1 packet by mouth daily.  Mix packet with water or juice      . ferrous sulfate 325 (65 FE) MG tablet Take 325 mg by mouth daily with breakfast.      . furosemide (LASIX) 80 MG tablet Take 1 tablet (80 mg total) by mouth 2 (two) times daily.  60 tablet  12  . insulin glargine (LANTUS) 100 UNIT/ML injection Inject 90 Units into the skin at bedtime.       . insulin lispro (HUMALOG) 100 UNIT/ML injection Inject 60 Units into the skin 3 (three) times daily before meals.       Marland Kitchen lisinopril (PRINIVIL,ZESTRIL) 10 MG tablet Take 1 tablet (10 mg total) by mouth daily.  30 tablet  12  . OVER THE COUNTER MEDICATION Apply 1 application topically daily as needed (apply to breast area for redness and itching). Medication:Hydrocortisone Cream apply to breast area for redness and itching      . spironolactone (ALDACTONE) 25 MG tablet Take 1 tablet (25 mg total) by mouth daily.  30 tablet  12   No current facility-administered medications for this visit.    Allergies:    Allergies  Allergen Reactions  . Metformin And Related Itching and Swelling  Leg pain & swelling in legs    Social History:  The patient  reports that she has never smoked. She does not have any smokeless tobacco history on file. She reports that she does not drink alcohol or use illicit drugs.   ROS:  Please see the history of present illness.   Denies any syncope, bleeding, orthopnea, PND  PHYSICAL EXAM: VS:  BP 134/76  Pulse 68  Ht 5\' 4"  (1.626 m)  Wt 263 lb (119.296 kg)  BMI 45.12 kg/m2 Well nourished, well developed, in no acute distress HEENT: normal Neck: no JVD Cardiac:  normal S1, S2; RRR; no murmur Lungs:  clear to auscultation bilaterally, no wheezing, rhonchi or rales Abd: soft, nontender, no hepatomegalyObese Ext: no edema Skin: warm and dry Neuro: no focal abnormalities noted  EKG: 11/06/12: Left bundle branch block, 68, sinus rhythm, no change from prior Cardiac angiogram-2011-widely patent grafts Nuclear stress test-2013-no  ischemia, old infarct, EF 31%  ASSESSMENT AND PLAN:  1. Chronic systolic heart failure-fairly well compensated. NYHA class 2-3. Continue to advocate dietary compliance, low-salt diet, fluid restrictions. 2. Left bundle branch block-unable to place biventricular lead due to vein sclerosis. 3. Morbid obesity-encourage weight loss. Low carbohydrate diet. 4. Angina-chest pain with exertion. Prior catheterization reassuring. Nuclear stress test reassuring. I will have isosorbide 30 mg added. 5. Hypertension-currently well controlled 6. Diabetes-continue to work hard with your primary physician. 7. We will see her back in one month  Signed, Donato Schultz, MD [facility]  11/06/2012 10:47 AM

## 2012-12-01 ENCOUNTER — Other Ambulatory Visit: Payer: Self-pay | Admitting: *Deleted

## 2012-12-01 DIAGNOSIS — E7800 Pure hypercholesterolemia, unspecified: Secondary | ICD-10-CM

## 2012-12-08 ENCOUNTER — Ambulatory Visit (INDEPENDENT_AMBULATORY_CARE_PROVIDER_SITE_OTHER): Payer: Medicaid Other | Admitting: Cardiology

## 2012-12-08 ENCOUNTER — Encounter: Payer: Self-pay | Admitting: Cardiology

## 2012-12-08 VITALS — BP 108/66 | HR 71 | Ht 66.0 in | Wt 265.0 lb

## 2012-12-08 DIAGNOSIS — I447 Left bundle-branch block, unspecified: Secondary | ICD-10-CM

## 2012-12-08 DIAGNOSIS — I208 Other forms of angina pectoris: Secondary | ICD-10-CM

## 2012-12-08 DIAGNOSIS — I2510 Atherosclerotic heart disease of native coronary artery without angina pectoris: Secondary | ICD-10-CM

## 2012-12-08 DIAGNOSIS — I2589 Other forms of chronic ischemic heart disease: Secondary | ICD-10-CM

## 2012-12-08 DIAGNOSIS — I519 Heart disease, unspecified: Secondary | ICD-10-CM

## 2012-12-08 DIAGNOSIS — I255 Ischemic cardiomyopathy: Secondary | ICD-10-CM

## 2012-12-08 NOTE — Patient Instructions (Addendum)
Your physician recommends that you schedule a follow-up appointment in: 3 MONTHS WITH  DR Anne Fu Your physician recommends that you continue on your current medications as directed. Please refer to the Current Medication list given to you today.

## 2012-12-08 NOTE — Progress Notes (Signed)
[address] [place], [place]  [address] Phone: [phone] Fax:  [phone]  Date:  12/08/2012   ID:  [name], DOB Sep 07, 1950, MRN [id number]  PCP:  Egbert Garibaldi, NP   History of Present Illness: [name] is a 62 y.o. female schematic cardiomyopathy EF 25-30%, ICD, CABG 2007, cardiac catheterization December of 2011 with widely patent grafts, stress test 2013 large defect prior infarct pattern, EF 31%.  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  11/06/12-does complain about angina with exertion. Overall though she is feeling quite well. She still wishes she could breathe better.  12/08/12 - Imdur has helped she says. No CP. No SOB. She still feels somewhat exhausted after prolonged exertional activity. Overall improved however.  Wt Readings from Last 3 Encounters:  12/08/12 265 lb (120.203 kg)  11/06/12 263 lb (119.296 kg)  09/26/12 269 lb 10 oz (122.3 kg)     Past Medical History  Diagnosis Date  . Coronary artery disease     s/p CABG 2007  . Sleep apnea     uses O2 at night  . Ischemic cardiomyopathy     EF 30-35%, s/p ICD 4/08 by Dr Amil Amen  . Diabetes mellitus     type II  . Hypertension   . Obesity   . CHF (congestive heart failure)   . Oxygen dependent 2 L  . Morbid obesity 11/06/2012  . Angina decubitus 11/06/2012    Past Surgical History  Procedure Laterality Date  . Cardiac catheterization    . Cardiac defibrillator placement  4/08    by Dr Amil Amen (MDT)  . Coronary artery bypass graft  2007  . Implantable cardioverter defibrillator implant      Current Outpatient Prescriptions  Medication Sig Dispense Refill  . albuterol (PROVENTIL HFA;VENTOLIN HFA) 108 (90 BASE) MCG/ACT inhaler Inhale 2 puffs into the lungs every 6 (six) hours as needed for wheezing.      Marland Kitchen aspirin EC 81 MG EC tablet Take 1 tablet (81 mg total) by mouth daily.  30 tablet  12  . atorvastatin (LIPITOR) 20 MG tablet Take 80 mg by mouth  daily.       . carvedilol (COREG) 25 MG tablet Take 0.5 tablets (12.5 mg total) by mouth 2 (two) times daily with a meal.  30 tablet  0  . Colesevelam HCl [facility]) 3.75 G PACK Take 1 packet by mouth daily. Mix packet with water or juice      . ferrous sulfate 325 (65 FE) MG tablet Take 325 mg by mouth daily with breakfast.      . furosemide (LASIX) 80 MG tablet Take 1 tablet (80 mg total) by mouth 2 (two) times daily.  60 tablet  12  . insulin lispro protamine-lispro (HUMALOG MIX 50/50) (50-50) 100 UNIT/ML SUSP injection INJECT 60 UNITS INTO THE SKIN IN THE MORNING AND AFTERNOON AFTER MEAL, THEN 80 UNITS AT NIGHT      . isosorbide mononitrate (IMDUR) 30 MG 24 hr tablet Take 1 tablet (30 mg total) by mouth daily.  30 tablet  6  . lisinopril (PRINIVIL,ZESTRIL) 10 MG tablet Take 1 tablet (10 mg total) by mouth daily.  30 tablet  12  . OVER THE COUNTER MEDICATION Apply 1 application topically daily as needed (apply to breast area for redness and itching). Medication:Hydrocortisone Cream apply to breast area for redness and itching      . spironolactone (ALDACTONE) 25 MG tablet Take  1 tablet (25 mg total) by mouth daily.  30 tablet  12   No current facility-administered medications for this visit.    Allergies:    Allergies  Allergen Reactions  . Metformin And Related Itching and Swelling    Leg pain & swelling in legs    Social History:  The patient  reports that she has never smoked. She does not have any smokeless tobacco history on file. She reports that she does not drink alcohol or use illicit drugs.   ROS:  Please see the history of present illness.   Denies any syncope, bleeding, orthopnea, PND  PHYSICAL EXAM: VS:  BP 108/66  Pulse 71  Ht 5\' 6"  (1.676 m)  Wt 265 lb (120.203 kg)  BMI 42.79 kg/m2  SpO2 93% Well nourished, well developed, in no acute distress HEENT: normal Neck: no JVD Cardiac:  normal S1, S2; RRR; no murmur Lungs:  clear to auscultation bilaterally, no  wheezing, rhonchi or rales Abd: soft, nontender, no hepatomegalyObese Ext: no edema Skin: warm and dry Neuro: no focal abnormalities noted  EKG: 11/06/12: Left bundle branch block, 68, sinus rhythm, no change from prior Cardiac angiogram-2011-widely patent grafts Nuclear stress test-2013-no ischemia, old infarct, EF 31%  ASSESSMENT AND PLAN:  1. Chronic systolic heart failure-fairly well compensated. NYHA class 2-3. Continue to advocate dietary compliance, low-salt diet, fluid restrictions. 2. Left bundle branch block-unable to place biventricular lead due to vein sclerosis. 3. Morbid obesity-encourage weight loss. Low carbohydrate diet. 4. Angina-chest pain with exertion has improved greatly with addition of isosorbide 30 mg.. Prior catheterization reassuring. Nuclear stress test reassuring. 5. Hypertension-currently well controlled 6. Diabetes-continue to work hard with your primary physician. 7. We will see her back in three months  Signed, Donato Schultz, MD [facility]  12/08/2012 2:18 PM

## 2012-12-22 ENCOUNTER — Other Ambulatory Visit (INDEPENDENT_AMBULATORY_CARE_PROVIDER_SITE_OTHER): Payer: Medicaid Other

## 2012-12-22 DIAGNOSIS — I509 Heart failure, unspecified: Secondary | ICD-10-CM

## 2012-12-22 DIAGNOSIS — I2589 Other forms of chronic ischemic heart disease: Secondary | ICD-10-CM

## 2012-12-22 DIAGNOSIS — I255 Ischemic cardiomyopathy: Secondary | ICD-10-CM

## 2012-12-22 DIAGNOSIS — E7800 Pure hypercholesterolemia, unspecified: Secondary | ICD-10-CM

## 2012-12-22 DIAGNOSIS — I5022 Chronic systolic (congestive) heart failure: Secondary | ICD-10-CM

## 2012-12-22 LAB — ALT: ALT: 20 U/L (ref 0–35)

## 2012-12-22 LAB — BASIC METABOLIC PANEL
Chloride: 102 mEq/L (ref 96–112)
GFR: 74.64 mL/min (ref >60.00)
Potassium: 4.2 mEq/L (ref 3.5–5.1)

## 2012-12-23 LAB — NMR LIPOPROFILE WITH LIPIDS
HDL Size: 8.9 nm — ABNORMAL LOW (ref >=9.2)
HDL-C: 33 mg/dL — ABNORMAL LOW (ref >=40)
LDL Particle Number: 567 nmol/L (ref <1000)
LDL Size: 20.2 nm — ABNORMAL LOW (ref >20.5)
Large HDL-P: 3.6 umol/L — ABNORMAL LOW (ref >=4.8)
Large VLDL-P: 2.6 nmol/L (ref <=2.7)
Triglycerides: 85 mg/dL (ref <150)
VLDL Size: 52.6 nm — ABNORMAL HIGH (ref <=46.6)

## 2012-12-24 ENCOUNTER — Ambulatory Visit: Payer: Medicaid Other | Admitting: Cardiology

## 2012-12-26 ENCOUNTER — Encounter: Payer: Self-pay | Admitting: Pharmacist

## 2013-01-07 ENCOUNTER — Ambulatory Visit (INDEPENDENT_AMBULATORY_CARE_PROVIDER_SITE_OTHER): Payer: Medicaid Other | Admitting: Internal Medicine

## 2013-01-07 ENCOUNTER — Encounter: Payer: Self-pay | Admitting: Internal Medicine

## 2013-01-07 VITALS — BP 133/70 | HR 68 | Ht 66.0 in | Wt 270.0 lb

## 2013-01-07 DIAGNOSIS — I447 Left bundle-branch block, unspecified: Secondary | ICD-10-CM

## 2013-01-07 DIAGNOSIS — I255 Ischemic cardiomyopathy: Secondary | ICD-10-CM

## 2013-01-07 DIAGNOSIS — I2589 Other forms of chronic ischemic heart disease: Secondary | ICD-10-CM

## 2013-01-07 DIAGNOSIS — I519 Heart disease, unspecified: Secondary | ICD-10-CM

## 2013-01-07 LAB — MDC_IDC_ENUM_SESS_TYPE_INCLINIC
Battery Remaining Longevity: 104.4 mo
Brady Statistic RA Percent Paced: 0.07 %
Date Time Interrogation Session: 20141231141647
Implantable Pulse Generator Serial Number: 7070007
Lead Channel Impedance Value: 425 Ohm
Lead Channel Impedance Value: 437.5 Ohm
Lead Channel Pacing Threshold Pulse Width: 0.5 ms
Lead Channel Sensing Intrinsic Amplitude: 1.5 mV
Lead Channel Sensing Intrinsic Amplitude: 12 mV
Lead Channel Setting Pacing Amplitude: 2.5 V
Lead Channel Setting Pacing Pulse Width: 0.5 ms
Lead Channel Setting Sensing Sensitivity: 0.5 mV
Zone Setting Detection Interval: 300 ms

## 2013-01-07 NOTE — Progress Notes (Signed)
PCP: Egbert Garibaldi, NP Primary Cardiologist:  [name] is a 62 y.o. female who presents today for electrophysiology followup.  Since her device replacement 9/14, she has done well.  Her SOB is stable.  She has made little effort at weight reduction.  Today, she denies symptoms of palpitations, chest pain,  dizziness, presyncope, syncope, or ICD shocks.  The patient is otherwise without complaint today.   Past Medical History  Diagnosis Date  . Coronary artery disease     s/p CABG 2007  . Sleep apnea     uses O2 at night  . Ischemic cardiomyopathy     EF 30-35%, s/p ICD 4/08 by Jamie Amil Amen  . Diabetes mellitus     type II  . Hypertension   . Obesity   . CHF (congestive heart failure)   . Oxygen dependent 2 L  . Morbid obesity 11/06/2012  . Angina decubitus 11/06/2012   Past Surgical History  Procedure Laterality Date  . Cardiac catheterization    . Cardiac defibrillator placement  4/08    by Jamie Amil Amen (MDT)  . Coronary artery bypass graft  2007  . Implantable cardioverter defibrillator implant  09/25/12    attempt of upgrade to CRT-D unsuccessful due to CS anatomy, SJM Unify Asaura device placed with LV port capped by Jamie Johney Frame    Current Outpatient Prescriptions  Medication Sig Dispense Refill  . albuterol (PROVENTIL HFA;VENTOLIN HFA) 108 (90 BASE) MCG/ACT inhaler Inhale 2 puffs into the lungs every 6 (six) hours as needed for wheezing.      Marland Kitchen aspirin EC 81 MG EC tablet Take 1 tablet (81 mg total) by mouth daily.  30 tablet  12  . atorvastatin (LIPITOR) 20 MG tablet Take 80 mg by mouth daily.       . carvedilol (COREG) 25 MG tablet Take 0.5 tablets (12.5 mg total) by mouth 2 (two) times daily with a meal.  30 tablet  0  . Colesevelam HCl [facility]) 3.75 G PACK Take 1 packet by mouth daily. Mix packet with water or juice      . ferrous sulfate 325 (65 FE) MG tablet Take 325 mg by mouth daily with breakfast.      . furosemide (LASIX) 80 MG tablet Take 1  tablet (80 mg total) by mouth 2 (two) times daily.  60 tablet  12  . insulin lispro protamine-lispro (HUMALOG MIX 50/50) (50-50) 100 UNIT/ML SUSP injection INJECT 60 UNITS INTO THE SKIN IN THE MORNING AND AFTERNOON AFTER MEAL, THEN 80 UNITS AT NIGHT      . isosorbide mononitrate (IMDUR) 30 MG 24 hr tablet Take 1 tablet (30 mg total) by mouth daily.  30 tablet  6  . lisinopril (PRINIVIL,ZESTRIL) 10 MG tablet Take 1 tablet (10 mg total) by mouth daily.  30 tablet  12  . OVER THE COUNTER MEDICATION Apply 1 application topically daily as needed. Medication:Hydrocortisone Cream apply to breast area for redness and itching      . spironolactone (ALDACTONE) 25 MG tablet Take 1 tablet (25 mg total) by mouth daily.  30 tablet  12  . STUDY MEDICATION Bococizumab (PCSK-9 inhibitor) lipid lowering study with [employer]       No current facility-administered medications for this visit.    Physical Exam: Filed Vitals:   01/07/13 1006  BP: 133/70  Pulse: 68  Height: 5\' 6"  (1.676 m)  Weight: 270 lb (122.471 kg)    GEN- The patient is chronically ill  and obese appearing, alert and oriented x 3 today.   Head- normocephalic, atraumatic Eyes-  Sclera clear, conjunctiva pink Ears- hearing intact Oropharynx- clear Lungs- Clear to ausculation bilaterally, normal work of breathing Chest- ICD pocket is well healed Heart- Regular rate and rhythm, no murmurs, rubs or gallops, PMI not laterally displaced GI- soft, NT, ND, + BS Extremities- no clubbing, cyanosis, +2 edema Neuro- strength/ sensation are intact  ICD interrogation- reviewed in detail today,  See PACEART report  Assessment and Plan:  1.  Chronic systolic dysfunction The patient has chronic systolic dysfunction, NYHA Class III CHF, and LBBB.  LV lead was unsuccessful due to scaring along the lateral was of the LV and few venous targets.  She has a BiV Device with the LV port plugged.  Given her obesity, she is a poor candidate for epicardial LV  lead placement, though this is an option that we have if needed down the road Normal ICD function.  See [name] Art report [name] to follow coreview in the ICM device clinic No changes today  2 CAD/ ischemic CM No ischemic symptoms Continue medical therapy  3. LBBB As above  Merlin Return to see me in 9 months

## 2013-01-07 NOTE — Patient Instructions (Signed)
Your physician wants you to follow-up in: 09/2013 with Dr Johney Frame Bonita Quin will receive a reminder letter in the mail two months in advance. If you don't receive a letter, please call our office to schedule the follow-up appointment.  Remote monitoring is used to monitor your Pacemaker or ICD from home. This monitoring reduces the number of office visits required to check your device to one time per year. It allows [name] to keep an eye on the functioning of your device to ensure it is working properly. You are scheduled for a device check from home on 04/10/13. You may send your transmission at any time that day. If you have a wireless device, the transmission will be sent automatically. After your physician reviews your transmission, you will receive a postcard with your next transmission date.  Sherri Rad, RN will call you in regards to your monthly check on your device

## 2013-01-09 ENCOUNTER — Telehealth: Payer: Self-pay | Admitting: Cardiology

## 2013-01-09 NOTE — Telephone Encounter (Signed)
New message     Pt says transmitter is not working

## 2013-01-09 NOTE — Telephone Encounter (Signed)
Pt had transmitter for previous [employer] device--does not have monitor for SJM device but does have SJM cell adapter. I will order her a new Merlin. She knows it will take up to 2 weeks to receive.

## 2013-01-19 ENCOUNTER — Telehealth: Payer: Self-pay | Admitting: Cardiology

## 2013-01-19 NOTE — Telephone Encounter (Signed)
New message        Pt would like to know if her transmit was received.

## 2013-01-21 NOTE — Telephone Encounter (Signed)
Follow up    Pt returned office call about her device please give her a call back.

## 2013-01-21 NOTE — Telephone Encounter (Signed)
LMOVM to call back 

## 2013-01-22 NOTE — Telephone Encounter (Signed)
Gave pt instructions to transmit w/ her Merlin + cell adapter. Transmission successfully received.

## 2013-02-09 ENCOUNTER — Ambulatory Visit (INDEPENDENT_AMBULATORY_CARE_PROVIDER_SITE_OTHER): Payer: Medicaid Other | Admitting: *Deleted

## 2013-02-09 DIAGNOSIS — I519 Heart disease, unspecified: Secondary | ICD-10-CM

## 2013-02-09 DIAGNOSIS — Z95810 Presence of automatic (implantable) cardiac defibrillator: Secondary | ICD-10-CM

## 2013-02-12 DIAGNOSIS — I519 Heart disease, unspecified: Secondary | ICD-10-CM

## 2013-02-12 DIAGNOSIS — Z95810 Presence of automatic (implantable) cardiac defibrillator: Secondary | ICD-10-CM

## 2013-02-12 NOTE — Progress Notes (Signed)
EPIC Encounter for ICM Monitoring  Patient Name: [name] is a 63 y.o. female Date: 02/12/2013 Primary Care Physican: Imelda Pillow, NP Primary Cardiologist: Marlou Porch Electrophysiologist: Allred Dry Weight: 265 lbs      In the past month, have you:  1. Gained more than 2 pounds in a day or more than 5 pounds in a week? No. The patient has not been weighing daily, but has been encouraged to do so.  2. Had changes in your medications (with verification of current medications)? no  3. Had more shortness of breath than is usual for you? no  4. Limited your activity because of shortness of breath? no  5. Not been able to sleep because of shortness of breath? no  6. Had increased swelling in your feet or ankles? no  7. Had symptoms of dehydration (dizziness, dry mouth, increased thirst, decreased urine output) no  8. Had changes in sodium restriction? no  9. Been compliant with medication? Yes   ICM trend:   Follow-up plan: ICM clinic phone appointment: 03/16/13  Copy of note sent to patient's primary care physician, primary cardiologist, and device following physician.  Alvis Lemmings, RN, BSN 02/12/2013 12:18 PM

## 2013-02-17 ENCOUNTER — Other Ambulatory Visit: Payer: Self-pay

## 2013-02-17 DIAGNOSIS — Z1231 Encounter for screening mammogram for malignant neoplasm of breast: Secondary | ICD-10-CM

## 2013-03-10 ENCOUNTER — Encounter: Payer: Self-pay | Admitting: Cardiology

## 2013-03-10 ENCOUNTER — Ambulatory Visit (INDEPENDENT_AMBULATORY_CARE_PROVIDER_SITE_OTHER): Payer: Medicaid Other | Admitting: Cardiology

## 2013-03-10 VITALS — Ht 66.0 in | Wt 273.0 lb

## 2013-03-10 DIAGNOSIS — I447 Left bundle-branch block, unspecified: Secondary | ICD-10-CM

## 2013-03-10 DIAGNOSIS — E119 Type 2 diabetes mellitus without complications: Secondary | ICD-10-CM

## 2013-03-10 DIAGNOSIS — I2510 Atherosclerotic heart disease of native coronary artery without angina pectoris: Secondary | ICD-10-CM

## 2013-03-10 DIAGNOSIS — I519 Heart disease, unspecified: Secondary | ICD-10-CM

## 2013-03-10 NOTE — Progress Notes (Signed)
[place]. [address] [place], [place]  [address] Phone: [phone] Fax:  [phone]  Date:  03/10/2013   ID:  [name], DOB 1950/11/03, MRN [id number]  PCP:  Imelda Pillow, NP   History of Present Illness: [name] is a 63 y.o. female schematic cardiomyopathy EF 25-30%, ICD, CABG 2007, cardiac catheterization December of 2011 with widely patent grafts, stress test 2013 large defect prior infarct pattern, EF 31%.  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  11/06/12-does complain about angina with exertion. Overall though she is feeling quite well. She still wishes she could breathe better.  12/08/12 - Imdur has helped she says. No CP. No SOB. She still feels somewhat exhausted after prolonged exertional activity. Overall improved however.  03/10/13-reviewed Dr. Rayann Heman. The lead was unsuccessful due to scarring along the lateral wall of the left ventricle and a few venous targets. Given her obesity, she is a poor candidate for epicardial LV lead placement though this is an option down the Road.  Wt Readings from Last 3 Encounters:  03/10/13 273 lb (123.832 kg)  01/07/13 270 lb (122.471 kg)  12/08/12 265 lb (120.203 kg)     Past Medical History  Diagnosis Date  . Coronary artery disease     s/p CABG 2007  . Sleep apnea     uses O2 at night  . Ischemic cardiomyopathy     EF 30-35%, s/p ICD 4/08 by Dr Leonia Reeves  . Diabetes mellitus     type II  . Hypertension   . Obesity   . CHF (congestive heart failure)   . Oxygen dependent 2 L  . Morbid obesity 11/06/2012  . Angina decubitus 11/06/2012    Past Surgical History  Procedure Laterality Date  . Cardiac catheterization    . Cardiac defibrillator placement  4/08    by Dr Leonia Reeves (MDT)  . Coronary artery bypass graft  2007  . Implantable cardioverter defibrillator implant  09/25/12    attempt of upgrade to CRT-D unsuccessful due to CS anatomy, SJM Unify Asaura device placed with LV port  capped by Dr Rayann Heman    Current Outpatient Prescriptions  Medication Sig Dispense Refill  . aspirin EC 81 MG EC tablet Take 1 tablet (81 mg total) by mouth daily.  30 tablet  12  . atorvastatin (LIPITOR) 10 MG tablet Take 10 mg by mouth daily.      . furosemide (LASIX) 80 MG tablet Take 1 tablet (80 mg total) by mouth 2 (two) times daily.  60 tablet  12  . insulin lispro protamine-lispro (HUMALOG MIX 50/50) (50-50) 100 UNIT/ML SUSP injection INJECT 60 UNITS INTO THE SKIN IN THE MORNING AND AFTERNOON AFTER MEAL, THEN 80 UNITS AT NIGHT      . Iron-Vitamin C (IRON 100/C PO) Take by mouth.      Marland Kitchen lisinopril (PRINIVIL,ZESTRIL) 10 MG tablet Take 1 tablet (10 mg total) by mouth daily.  30 tablet  12  . spironolactone (ALDACTONE) 25 MG tablet Take 25 mg by mouth daily.       No current facility-administered medications for this visit.    Allergies:    Allergies  Allergen Reactions  . Metformin And Related Itching, Swelling and Other (See Comments)    Leg pain & swelling in legs    Social History:  The patient  reports that she has never smoked. She does not have any smokeless tobacco history on file. She reports that she  does not drink alcohol or use illicit drugs.   ROS:  Please see the history of present illness.   Denies any syncope, bleeding, orthopnea, PND  PHYSICAL EXAM: VS:  Ht 5\' 6"  (1.676 m)  Wt 273 lb (123.832 kg)  BMI 44.08 kg/m2 Well nourished, well developed, in no acute distress HEENT: normal Neck: no JVD Cardiac:  normal S1, S2; RRR; no murmur Lungs:  clear to auscultation bilaterally, no wheezing, rhonchi or rales Abd: soft, nontender, no hepatomegalyObese Ext: no edema Skin: warm and dry Neuro: no focal abnormalities noted  EKG: 11/06/12: Left bundle branch block, 68, sinus rhythm, no change from prior Cardiac angiogram-2011-widely patent grafts Nuclear stress test-2013-no ischemia, old infarct, EF 31%  ASSESSMENT AND PLAN:  1. Chronic systolic heart  failure-fairly well compensated. NYHA class 2-3. Continue to advocate dietary compliance, low-salt diet, fluid restrictions. She has gained some weight however. I would like for her to take Lasix 80 mg twice a day for the next 3 days then cut back to once a day. I will check a basic metabolic profile. 2. Left bundle branch block-unable to place biventricular lead due to vein sclerosis. 3. Morbid obesity-encourage weight loss. Low carbohydrate diet. 4. Angina-chest pain with exertion has improved greatly with addition of isosorbide 30 mg.. Prior catheterization reassuring. Nuclear stress test reassuring. 5. Hypertension-currently well controlled 6. Diabetes-continue to work hard with your primary physician. 7. We will see her back in three months  Signed, Candee Furbish, MD [facility]  03/10/2013 3:55 PM

## 2013-03-10 NOTE — Patient Instructions (Addendum)
Your physician has recommended you make the following change in your medication:   1. Increase Lasix to 80 mg twice daily for three days the resume to 80 mg once daily.  Your physician recommends that you have labs today: BMET  Your physician recommends that you schedule a follow-up appointment in: 3 months with Dr. Marlou Porch.

## 2013-03-11 ENCOUNTER — Telehealth: Payer: Self-pay | Admitting: Cardiology

## 2013-03-11 LAB — BASIC METABOLIC PANEL
BUN: 11 mg/dL (ref 6–23)
CHLORIDE: 101 meq/L (ref 96–112)
CO2: 34 meq/L — AB (ref 19–32)
Calcium: 9.6 mg/dL (ref 8.4–10.5)
Creatinine, Ser: 0.8 mg/dL (ref 0.4–1.2)
GFR: 88.06 mL/min (ref >60.00)
Glucose, Bld: 144 mg/dL — ABNORMAL HIGH (ref 70–99)
POTASSIUM: 3.8 meq/L (ref 3.5–5.1)
SODIUM: 141 meq/L (ref 135–145)

## 2013-03-11 NOTE — Progress Notes (Signed)
Quick Note:  Preliminary report reviewed by triage nurse and sent to MD desk. ______ 

## 2013-03-11 NOTE — Telephone Encounter (Signed)
New message     Patient has a breathing machine from [facility] . please has some questions.

## 2013-03-11 NOTE — Telephone Encounter (Signed)
New message          Pt needs to be retested for oxygen. [name] Patient will fax over papers with medicaid guidelines. Please call Charlene with [facility]

## 2013-03-16 ENCOUNTER — Telehealth: Payer: Self-pay | Admitting: Cardiology

## 2013-03-16 ENCOUNTER — Ambulatory Visit (INDEPENDENT_AMBULATORY_CARE_PROVIDER_SITE_OTHER): Payer: Medicaid Other | Admitting: *Deleted

## 2013-03-16 DIAGNOSIS — Z95810 Presence of automatic (implantable) cardiac defibrillator: Secondary | ICD-10-CM

## 2013-03-16 DIAGNOSIS — I519 Heart disease, unspecified: Secondary | ICD-10-CM

## 2013-03-16 NOTE — Telephone Encounter (Signed)
Oxygen Re certification faxed to PCP.

## 2013-03-16 NOTE — Progress Notes (Addendum)
EPIC Encounter for ICM Monitoring  Patient Name: [name] is a 63 y.o. female Date: 03/16/2013 Primary Care Physican: Imelda Pillow, NP Primary Cardiologist: Marlou Porch Electrophysiologist: Allred Dry Weight: 268 lbs       In the past month, have you:  1. Gained more than 2 pounds in a day or more than 5 pounds in a week? Unsure. The patient has not been checking her weights at home. She does have a scale. We discussed the importance of daily weights and how to do this. She has been encouraged to start this and record the readings. Patient states she will start tomorrow.   2. Had changes in your medications (with verification of current medications)? No. The patient did take her lasix 80 mg BID x 3 days last week as instructed by Dr. Marlou Porch. She did notice that her urination may have increased somewhat during that time.  3. Had more shortness of breath than is usual for you? Yes. The patient states this has been going on for a little while now. She is audibly SOB just talking on the phone with me today. She has not taken her lasix today though because her bathroom is being worked on. However, the patient states she has been getting SOB with talking.   4. Limited your activity because of shortness of breath? no  5. Not been able to sleep because of shortness of breath? Yes. The patient does feel this is related to her breathing. She only sleeps on one pillow at night currently.  6. Had increased swelling in your feet or ankles? no  7. Had symptoms of dehydration (dizziness, dry mouth, increased thirst, decreased urine output) no  8. Had changes in sodium restriction? no  9. Been compliant with medication? Yes   ICM trend:   Follow-up plan: ICM clinic phone appointment: 04/16/13 ( full transmission). I advised the patient I would forward her information to Dr. Marlou Porch. We will call back if he makes any recommendations for medication changes due to her continued SOB. She  Voices  understanding.   Copy of note sent to patient's primary care physician, primary cardiologist, and device following physician.  Alvis Lemmings, RN, BSN 03/16/2013 3:20 PM  ** 03/19/13- I spoke with the patient this morning about Dr. Marlou Porch recommendations to take her lasix 80 mg BID for the next three days. I have encouraged her again today to please weigh herself daily. She has also been encouraged to call the office if she notices that her weights are going up and her SOB is getting worse. She verbalizes understanding. **

## 2013-03-16 NOTE — Progress Notes (Signed)
Please have her take Lasix 80 mg twice a day for a another 3 days then go back to normal dosing.

## 2013-03-16 NOTE — Telephone Encounter (Signed)
Spoke with patient advised that we haven't received a letter from [facility] regarding testing of oxygen. Referred patient to PCP to have PCP monitor Oxygen.

## 2013-03-19 NOTE — Addendum Note (Signed)
Addended by: Alvis Lemmings C on: 03/19/2013 09:23 AM   Modules accepted: Orders

## 2013-03-23 ENCOUNTER — Ambulatory Visit
Admission: RE | Admit: 2013-03-23 | Discharge: 2013-03-23 | Disposition: A | Discharge status: Home / Self Care | Payer: Medicaid Other | Source: Ambulatory Visit

## 2013-03-23 DIAGNOSIS — Z1231 Encounter for screening mammogram for malignant neoplasm of breast: Secondary | ICD-10-CM

## 2013-04-06 ENCOUNTER — Telehealth: Payer: Self-pay | Admitting: *Deleted

## 2013-04-06 MED ORDER — ATORVASTATIN CALCIUM 80 MG PO TABS: 80.0000 mg | ORAL_TABLET | Freq: Every day | ORAL | Status: DC

## 2013-04-06 NOTE — Telephone Encounter (Signed)
Called pt & verified Atorvastatin dose. She states her bottle reads Atorvastatin 80mg  daily Corrected in chart & refill sent in [place]

## 2013-04-16 ENCOUNTER — Encounter: Payer: Self-pay | Admitting: Internal Medicine

## 2013-04-16 ENCOUNTER — Encounter: Payer: Self-pay | Admitting: *Deleted

## 2013-04-16 ENCOUNTER — Ambulatory Visit (INDEPENDENT_AMBULATORY_CARE_PROVIDER_SITE_OTHER): Payer: Medicaid Other | Admitting: *Deleted

## 2013-04-16 DIAGNOSIS — I519 Heart disease, unspecified: Secondary | ICD-10-CM

## 2013-04-16 DIAGNOSIS — I2589 Other forms of chronic ischemic heart disease: Secondary | ICD-10-CM

## 2013-04-16 DIAGNOSIS — Z95810 Presence of automatic (implantable) cardiac defibrillator: Secondary | ICD-10-CM

## 2013-04-16 DIAGNOSIS — I255 Ischemic cardiomyopathy: Secondary | ICD-10-CM

## 2013-04-16 LAB — MDC_IDC_ENUM_SESS_TYPE_REMOTE
Brady Statistic RV Percent Paced: 1 % — CL
HighPow Impedance: 34 Ohm
Lead Channel Impedance Value: 430 Ohm
Lead Channel Sensing Intrinsic Amplitude: 12 mV
Lead Channel Setting Pacing Amplitude: 2.5 V
Lead Channel Setting Pacing Pulse Width: 0.5 ms
MDC IDC MSMT LEADCHNL RA SENSING INTR AMPL: 1.7 mV
MDC IDC MSMT LEADCHNL RV IMPEDANCE VALUE: 390 Ohm
MDC IDC PG SERIAL: 7070007
MDC IDC SET LEADCHNL RA PACING AMPLITUDE: 2.5 V
MDC IDC SET LEADCHNL RV SENSING SENSITIVITY: 0.5 mV
MDC IDC SET ZONE DETECTION INTERVAL: 300 ms
MDC IDC SET ZONE DETECTION INTERVAL: 350 ms
MDC IDC STAT BRADY RA PERCENT PACED: 1 % — AB
Zone Setting Detection Interval: 250 ms

## 2013-04-16 NOTE — Progress Notes (Signed)
EPIC Encounter for ICM Monitoring  Patient Name: [name] is a 63 y.o. female Date: 04/16/2013 Primary Care Physican: Imelda Pillow, NP Primary Cardiologist: Marlou Porch Electrophysiologist: Allred Dry Weight: 268 lbs       In the past month, have you:  1. Gained more than 2 pounds in a day or more than 5 pounds in a week? Yes. The patient weighed 270 lbs today. She does not weight daily, even though this has been encouraged multiple times.   2. Had changes in your medications (with verification of current medications)? No. (She has been out of her spironolactone for several days, but just needs to go to the pharmacy and pick this up).  3. Had more shortness of breath than is usual for you? No (She is usually SOB).  4. Limited your activity because of shortness of breath? no  5. Not been able to sleep because of shortness of breath? no  6. Had increased swelling in your feet or ankles? Yes. The patient has noticed swelling to her feet over the last week.   7. Had symptoms of dehydration (dizziness, dry mouth, increased thirst, decreased urine output) no  8. Had changes in sodium restriction? no  9. Been compliant with medication? Yes   ICM trend:   Follow-up plan: ICM clinic phone appointment: 05/18/13. Since the patient's weight is up slightly and she is having swelling that correlates with elevated corvue trends, I have advised her to take lasix 80 mg BID x 3 days, then resume her lasix at 80 mg daily. On 03/10/13 the patient's BUN/ Creatinine were 11/0.8. I have advised the patient to please call back should her weights continue to rise or her swelling or SOB worsen. She voices understanding.   Copy of note sent to patient's primary care physician, primary cardiologist, and device following physician.  Emily Filbert, RN, BSN 04/16/2013 3:41 PM

## 2013-04-21 ENCOUNTER — Other Ambulatory Visit: Payer: Self-pay | Admitting: Internal Medicine

## 2013-05-01 ENCOUNTER — Encounter: Payer: Self-pay | Admitting: *Deleted

## 2013-05-14 ENCOUNTER — Ambulatory Visit (HOSPITAL_BASED_OUTPATIENT_CLINIC_OR_DEPARTMENT_OTHER): Payer: Medicaid Other | Attending: Physician Assistant | Admitting: Radiology

## 2013-05-14 VITALS — Ht 64.0 in | Wt 267.0 lb

## 2013-05-14 DIAGNOSIS — G4700 Insomnia, unspecified: Secondary | ICD-10-CM | POA: Diagnosis present

## 2013-05-14 DIAGNOSIS — G4733 Obstructive sleep apnea (adult) (pediatric): Secondary | ICD-10-CM | POA: Diagnosis not present

## 2013-05-16 DIAGNOSIS — G4700 Insomnia, unspecified: Secondary | ICD-10-CM

## 2013-05-16 DIAGNOSIS — G4730 Sleep apnea, unspecified: Secondary | ICD-10-CM

## 2013-05-16 DIAGNOSIS — G4733 Obstructive sleep apnea (adult) (pediatric): Secondary | ICD-10-CM

## 2013-05-16 NOTE — Sleep Study (Signed)
   NAME: [name] DATE OF BIRTH:  [birth year] MEDICAL RECORD NUMBER [id number]  LOCATION: [facility]  PHYSICIAN: Jerry Haugen D Kaarin Pardy  DATE OF STUDY: 05/14/2013  SLEEP STUDY TYPE: Nocturnal Polysomnogram               REFERRING PHYSICIAN: [place], O'Laf, PA-C  INDICATION FOR STUDY: Insomnia with sleep apnea  EPWORTH SLEEPINESS SCORE:   8/24 HEIGHT: 5\' 4"  (162.6 cm)  WEIGHT: 267 lb (121.11 kg)    Body mass index is 45.81 kg/(m^2).  NECK SIZE: 16 in.  MEDICATIONS: Charted for review  SLEEP ARCHITECTURE: Total sleep time 114.5 minutes with sleep efficiency 30.1%. Stage I was 12.7%, stage II 69%, stage III absent, REM 18.3% of total sleep time. Sleep latency 15 minutes, REM latency 64.5 minutes, awake after sleep onset 250.5 minutes, arousal index 35.1, bedtime medication: None. She fell asleep initially around 11 PM but woke by 12:30 and was awake again until 4 AM, similar to what she describes as her home pattern.  RESPIRATORY DATA: Apnea hypopnea index (AHI) 37.7 per hour. 72 total events scored including 7 obstructive apneas and 65 hypopneas. All events were while nonsupine. REM AHI of 5.7 per hour. She had insufficient early sleep and events to meet protocol. requirements for split CPAP titration.  OXYGEN DATA:  mild to moderate snoring with oxygen desaturation to a nadir of 81% and mean oxygen saturation through the study of 89.9% on room air.   CARDIAC DATA:  sinus rhythm with PVCs and a 6 beat run of V. tach  MOVEMENT/PARASOMNIA: No significant movement disturbance, bathroom x2  IMPRESSION/ RECOMMENDATION:   1) Severe obstructive sleep apnea/hypopnea syndrome, AHI 37.7 per hour nonsupine events. REM AHI of 5.7 per hour. Mild to moderate snoring with oxygen desaturation to a nadir of 81% and mean oxygen saturation through the study of 89.9% on room air.  2) Significant difficulty maintaining sleep. Without bedtime medication, she was awake from  approximately 12:30 until 4 AM. This matches the pattern she described on her intake information. It will likely help to address sleep habits and probably to manage insomnia as a separate sleep disorder. 3) Her apnea events developed late, when sleep was resumed after 4 AM. Split protocol CPAP titration could not be applied. It may be helpful to have her return for a dedicated CPAP titration study, bringing a sleep medication. She needs to avoid daytime naps and late-night stimulants.  Aldona Bar and M.D. [place], [employer] [facility]  ELECTRONICALLY SIGNED ON:  05/16/2013, 1:23 PM [place] PH: [phone]   FX: [phone] [place]

## 2013-05-18 ENCOUNTER — Ambulatory Visit (INDEPENDENT_AMBULATORY_CARE_PROVIDER_SITE_OTHER): Payer: Medicaid Other | Admitting: *Deleted

## 2013-05-18 DIAGNOSIS — I519 Heart disease, unspecified: Secondary | ICD-10-CM

## 2013-05-18 DIAGNOSIS — Z95810 Presence of automatic (implantable) cardiac defibrillator: Secondary | ICD-10-CM

## 2013-05-21 ENCOUNTER — Encounter: Payer: Self-pay | Admitting: *Deleted

## 2013-05-21 NOTE — Progress Notes (Signed)
EPIC Encounter for ICM Monitoring  Patient Name: [name] is a 63 y.o. female Date: 05/21/2013 Primary Care Physican: Imelda Pillow, NP Primary Cardiologist: Marlou Porch Electrophysiologist: Allred Dry Weight: 268 lbs      In the past month, have you:  1. Gained more than 2 pounds in a day or more than 5 pounds in a week? no  2. Had changes in your medications (with verification of current medications)? no  3. Had more shortness of breath than is usual for you? no  4. Limited your activity because of shortness of breath? no  5. Not been able to sleep because of shortness of breath? no  6. Had increased swelling in your feet or ankles? no  7. Had symptoms of dehydration (dizziness, dry mouth, increased thirst, decreased urine output) no  8. Had changes in sodium restriction? no  9. Been compliant with medication? Yes  ** The patient has a sleep study done last week that was ordered by her PCP. Results are pending. **  ICM trend:   Follow-up plan: ICM clinic phone appointment: 06/22/13  Copy of note sent to patient's primary care physician, primary cardiologist, and device following physician.  Emily Filbert, RN, BSN 05/21/2013 3:03 PM

## 2013-06-10 ENCOUNTER — Encounter: Payer: Self-pay | Admitting: Cardiology

## 2013-06-10 ENCOUNTER — Ambulatory Visit (INDEPENDENT_AMBULATORY_CARE_PROVIDER_SITE_OTHER): Payer: Medicaid Other | Admitting: Cardiology

## 2013-06-10 VITALS — BP 126/80 | HR 63 | Ht 64.0 in | Wt 268.0 lb

## 2013-06-10 DIAGNOSIS — I2510 Atherosclerotic heart disease of native coronary artery without angina pectoris: Secondary | ICD-10-CM

## 2013-06-10 DIAGNOSIS — I2589 Other forms of chronic ischemic heart disease: Secondary | ICD-10-CM

## 2013-06-10 DIAGNOSIS — I255 Ischemic cardiomyopathy: Secondary | ICD-10-CM

## 2013-06-10 DIAGNOSIS — I519 Heart disease, unspecified: Secondary | ICD-10-CM

## 2013-06-10 DIAGNOSIS — I10 Essential (primary) hypertension: Secondary | ICD-10-CM

## 2013-06-10 DIAGNOSIS — I447 Left bundle-branch block, unspecified: Secondary | ICD-10-CM

## 2013-06-10 NOTE — Progress Notes (Signed)
[facility]. [address], [place]  [address] Phone: [phone] Fax:  [phone]  Date:  06/10/2013   ID:  [name], DOB February 16, 1950, MRN [id number]  PCP:  Imelda Pillow, NP   History of Present Illness: [name] is a 63 y.o. female schematic cardiomyopathy EF 25-30%, ICD, CABG 2007, cardiac catheterization December of 2011 with widely patent grafts, stress test 2013 large defect prior infarct pattern, EF 31%.  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  11/06/12-does complain about angina with exertion. Overall though she is feeling quite well. She still wishes she could breathe better.  12/08/12 - Imdur has helped she says. No CP. No SOB. She still feels somewhat exhausted after prolonged exertional activity. Overall improved however.  03/10/13-reviewed Dr. Rayann Heman. The lead was unsuccessful due to scarring along the lateral wall of the left ventricle and a few venous targets. Given her obesity, she is a poor candidate for epicardial LV lead placement though this is an option down the Road.  06/10/13-overall doing quite well currently. At one point between her visits, she did have to increase her Lasix but now she is back on steady state and doing well. She does have mild shortness of breath with moderate activity. No change. No orthopnea.  Wt Readings from Last 3 Encounters:  06/10/13 268 lb (121.564 kg)  05/14/13 267 lb (121.11 kg)  03/10/13 273 lb (123.832 kg)     Past Medical History  Diagnosis Date  . Coronary artery disease     s/p CABG 2007  . Sleep apnea     uses O2 at night  . Ischemic cardiomyopathy     EF 30-35%, s/p ICD 4/08 by Dr Leonia Reeves  . Diabetes mellitus     type II  . Hypertension   . Obesity   . CHF (congestive heart failure)   . Oxygen dependent 2 L  . Morbid obesity 11/06/2012  . Angina decubitus 11/06/2012    Past Surgical History  Procedure Laterality Date  . Cardiac catheterization    . Cardiac defibrillator  placement  4/08    by Dr Leonia Reeves ([facility])  . Coronary artery bypass graft  2007  . Implantable cardioverter defibrillator implant  09/25/12    attempt of upgrade to CRT-D unsuccessful due to CS anatomy, SJM Unify Asaura device placed with LV port capped by Dr Rayann Heman    Current Outpatient Prescriptions  Medication Sig Dispense Refill  . aspirin EC 81 MG EC tablet Take 1 tablet (81 mg total) by mouth daily.  30 tablet  12  . atorvastatin (LIPITOR) 80 MG tablet Take 1 tablet (80 mg total) by mouth daily.  30 tablet  6  . carvedilol (COREG) 25 MG tablet Take 25 mg by mouth 2 (two) times daily with a meal.      . furosemide (LASIX) 80 MG tablet Take 80 mg by mouth daily.      . insulin lispro protamine-lispro (HUMALOG MIX 50/50) (50-50) 100 UNIT/ML SUSP injection INJECT 60 UNITS INTO THE SKIN IN THE MORNING AND AFTERNOON AFTER MEAL, THEN 80 UNITS AT NIGHT      . Iron-Vitamin C (IRON 100/C PO) Take by mouth.      . isosorbide dinitrate (ISORDIL) 5 MG tablet Take 5 mg by mouth 3 (three) times daily.      Marland Kitchen lisinopril (PRINIVIL,ZESTRIL) 10 MG tablet Take 1 tablet (10 mg total) by mouth daily.  30 tablet  12  .  spironolactone (ALDACTONE) 25 MG tablet Take 25 mg by mouth daily.       No current facility-administered medications for this visit.    Allergies:    Allergies  Allergen Reactions  . Metformin And Related Itching, Swelling and Other (See Comments)    Leg pain & swelling in legs    Social History:  The patient  reports that she has never smoked. She does not have any smokeless tobacco history on file. She reports that she does not drink alcohol or use illicit drugs.   ROS:  Please see the history of present illness.   Denies any syncope, bleeding, orthopnea, PND  PHYSICAL EXAM: VS:  BP 126/80  Pulse 63  Ht 5\' 4"  (1.626 m)  Wt 268 lb (121.564 kg)  BMI 45.98 kg/m2 Well nourished, well developed, in no acute distress HEENT: normal Neck: no JVD Cardiac:  normal S1, S2; RRR; no  murmur Lungs:  clear to auscultation bilaterally, no wheezing, rhonchi or rales Abd: soft, nontender, no hepatomegalyObese Ext: no edema Skin: warm and dry Neuro: no focal abnormalities noted  EKG: 11/06/12: Left bundle branch block, 68, sinus rhythm, no change from prior 06/10/13-sinus rhythm, 63, left bundle branch block Cardiac angiogram-2011-widely patent grafts Nuclear stress test-2013-no ischemia, old infarct, EF 31%  ASSESSMENT AND PLAN:  1. Chronic systolic heart failure-fairly well compensated. NYHA class 2-3. Continue to advocate dietary compliance, low-salt diet, fluid restrictions.  2. Left bundle branch block-unable to place biventricular lead due to vein sclerosis. 3. Morbid obesity-encourage weight loss. Low carbohydrate diet. 4. Angina-chest pain with exertion has improved greatly with addition of isosorbide 30 mg.. Prior catheterization reassuring. Nuclear stress test reassuring. 5. Hypertension-currently well controlled 6. Diabetes-continue to work hard with your primary physician. 7. We will see her back in four months  Signed, Candee Furbish, MD [facility]  06/10/2013 3:52 PM

## 2013-06-10 NOTE — Patient Instructions (Addendum)
Your physician wants you to follow-up in: 4 MONTHS DR. Dawna Part will receive a reminder letter in the mail two months in advance. If you don't receive a letter, please call our office to schedule the follow-up appointment.  Your physician recommends that you continue on your current medications as directed. Please refer to the Current Medication list given to you today.

## 2013-06-22 ENCOUNTER — Ambulatory Visit (INDEPENDENT_AMBULATORY_CARE_PROVIDER_SITE_OTHER): Payer: Medicaid Other | Admitting: *Deleted

## 2013-06-22 ENCOUNTER — Encounter: Payer: Self-pay | Admitting: *Deleted

## 2013-06-22 DIAGNOSIS — Z95810 Presence of automatic (implantable) cardiac defibrillator: Secondary | ICD-10-CM

## 2013-06-22 DIAGNOSIS — I519 Heart disease, unspecified: Secondary | ICD-10-CM

## 2013-06-22 NOTE — Progress Notes (Signed)
EPIC Encounter for ICM Monitoring  Patient Name: [name] is a 63 y.o. female Date: 06/22/2013 Primary Care Physican: Imelda Pillow, NP Primary Cardiologist: Marlou Porch Electrophysiologist: Allred Dry Weight: 268 lbs       In the past month, have you:  1. Gained more than 2 pounds in a day or more than 5 pounds in a week? Uncertain. Scale is broken currently. She was at her baseline weight on 06/10/13 when she saw Dr. Marlou Porch.  2. Had changes in your medications (with verification of current medications)? no  3. Had more shortness of breath than is usual for you? no  4. Limited your activity because of shortness of breath? no  5. Not been able to sleep because of shortness of breath? no  6. Had increased swelling in your feet or ankles? No. She still does notice a small amount of swelling at times.  7. Had symptoms of dehydration (dizziness, dry mouth, increased thirst, decreased urine output) no  8. Had changes in sodium restriction? no  9. Been compliant with medication? Yes   ICM trend:   Follow-up plan: ICM clinic phone appointment: 07/23/13  Copy of note sent to patient's primary care physician, primary cardiologist, and device following physician.  Alvis Lemmings, RN, BSN 06/22/2013 12:28 PM

## 2013-06-24 ENCOUNTER — Encounter: Payer: Self-pay | Admitting: Pharmacist

## 2013-07-23 ENCOUNTER — Ambulatory Visit (INDEPENDENT_AMBULATORY_CARE_PROVIDER_SITE_OTHER): Payer: Medicaid Other | Admitting: *Deleted

## 2013-07-23 ENCOUNTER — Encounter: Payer: Self-pay | Admitting: *Deleted

## 2013-07-23 DIAGNOSIS — I2589 Other forms of chronic ischemic heart disease: Secondary | ICD-10-CM

## 2013-07-23 DIAGNOSIS — Z95810 Presence of automatic (implantable) cardiac defibrillator: Secondary | ICD-10-CM

## 2013-07-23 DIAGNOSIS — I255 Ischemic cardiomyopathy: Secondary | ICD-10-CM

## 2013-07-23 DIAGNOSIS — I519 Heart disease, unspecified: Secondary | ICD-10-CM

## 2013-07-23 NOTE — Progress Notes (Signed)
Remote ICD transmission.   

## 2013-07-23 NOTE — Progress Notes (Signed)
EPIC Encounter for ICM Monitoring  Patient Name: [name] is a 63 y.o. female Date: 07/23/2013 Primary Care Physican: Imelda Pillow, NP Primary Cardiologist: Marlou Porch Electrophysiologist: Allred Dry Weight: 268 lbs       In the past month, have you:  1. Gained more than 2 pounds in a day or more than 5 pounds in a week? Uncertain. Her scale has not been working at home.  2. Had changes in your medications (with verification of current medications)? no  3. Had more shortness of breath than is usual for you? no  4. Limited your activity because of shortness of breath? no  5. Not been able to sleep because of shortness of breath? no  6. Had increased swelling in your feet or ankles? Yes. The patient's Corvue readings are up from 7/3-7/15. The patient states she has been traveling.  7. Had symptoms of dehydration (dizziness, dry mouth, increased thirst, decreased urine output) no  8. Had changes in sodium restriction? no  9. Been compliant with medication? Yes   ICM trend:   Follow-up plan: ICM clinic phone appointment: 08/24/13. The patient's corvue is trending back to baseline, but the patient is still having swelling in her lower extremities. She currently takes lasix 80 mg daily. I have advised her to take and extra 40 mg of lasix in the afternoon for the next 3 days. Previous labwork shows the patient to have a normal renal function (most recent was 03/10/13- BUN/ creatinine- 11/0.8). I have also encouraged her to get up and move around as much as possible, even if she cannot be up long, she needs to get up often. She voices understanding. She will call back if her symptoms persist or worsen.  Copy of note sent to patient's primary care physician, primary cardiologist, and device following physician.  Alvis Lemmings, RN, BSN 07/23/2013 10:41 AM

## 2013-07-31 LAB — MDC_IDC_ENUM_SESS_TYPE_REMOTE
Brady Statistic RA Percent Paced: 1 % — CL
HighPow Impedance: 41 Ohm
Implantable Pulse Generator Serial Number: 7070007
Lead Channel Impedance Value: 510 Ohm
Lead Channel Impedance Value: 510 Ohm
Lead Channel Sensing Intrinsic Amplitude: 12 mV
Lead Channel Setting Pacing Amplitude: 2.5 V
Lead Channel Setting Pacing Amplitude: 2.5 V
Lead Channel Setting Pacing Pulse Width: 0.5 ms
MDC IDC MSMT BATTERY REMAINING PERCENTAGE: 86 %
MDC IDC MSMT LEADCHNL RA SENSING INTR AMPL: 1.9 mV
MDC IDC SET LEADCHNL RV SENSING SENSITIVITY: 0.5 mV
MDC IDC SET ZONE DETECTION INTERVAL: 250 ms
MDC IDC SET ZONE DETECTION INTERVAL: 300 ms
MDC IDC SET ZONE DETECTION INTERVAL: 350 ms
MDC IDC STAT BRADY RV PERCENT PACED: 1 % — AB

## 2013-08-13 ENCOUNTER — Encounter: Payer: Self-pay | Admitting: Cardiology

## 2013-08-21 ENCOUNTER — Encounter: Payer: Self-pay | Admitting: Internal Medicine

## 2013-08-24 ENCOUNTER — Encounter: Payer: Self-pay | Admitting: *Deleted

## 2013-08-24 ENCOUNTER — Ambulatory Visit (INDEPENDENT_AMBULATORY_CARE_PROVIDER_SITE_OTHER): Payer: Medicaid Other | Admitting: *Deleted

## 2013-08-24 DIAGNOSIS — Z95810 Presence of automatic (implantable) cardiac defibrillator: Secondary | ICD-10-CM

## 2013-08-24 DIAGNOSIS — I519 Heart disease, unspecified: Secondary | ICD-10-CM

## 2013-08-24 NOTE — Progress Notes (Signed)
EPIC Encounter for ICM Monitoring  Patient Name: [name] is a 63 y.o. female Date: 08/24/2013 Primary Care Physican: Imelda Pillow, NP Primary Cardiologist: Marlou Porch Electrophysiologist: Allred Dry Weight: 268 lbs      In the past month, have you:  1. Gained more than 2 pounds in a day or more than 5 pounds in a week? Uncertain. Her scale is broken. Corvue readings were slightly elevated from ~7/28-8/6. They have returned to normal. The patient does feel like her rings are tighter than usual and her breathing is a little more labored.  2. Had changes in your medications (with verification of current medications)? We did have the patient take an additional 40 mg of lasix in the afternoon x 3 days last month.  This did help her some.  3. Had more shortness of breath than is usual for you? Yes  4. Limited your activity because of shortness of breath? Yes  5. Not been able to sleep because of shortness of breath? no  6. Had increased swelling in your feet or ankles? no  7. Had symptoms of dehydration (dizziness, dry mouth, increased thirst, decreased urine output) no  8. Had changes in sodium restriction? no  9. Been compliant with medication? Yes   ICM trend:   Follow-up plan: ICM clinic phone appointment: 09/24/13. The patient's corvue readings have returned to normal. Due to her swelling in her hands and slightly more labored breathing, I have instructed her to take and additional lasix 40 mg in the afternoon x 3 days. I have advised her to call if this does not help her. I will call her back in 1 week to check on her.  Copy of note sent to patient's primary care physician, primary cardiologist, and device following physician.  Alvis Lemmings, RN, BSN 08/24/2013 2:45 PM

## 2013-08-31 ENCOUNTER — Telehealth: Payer: Self-pay | Admitting: *Deleted

## 2013-08-31 NOTE — Telephone Encounter (Signed)
1 week follow up call- ICM transmission done on 8/17. The patient was complaining of swelling to her hands and had recent elevation of her corvue readings. She took an additional 40 mg of lasix x 3 days. She states this did help and she is feeling better right now. I have advised her to call back should her swelling resume. She voices understanding.

## 2013-09-24 ENCOUNTER — Ambulatory Visit (INDEPENDENT_AMBULATORY_CARE_PROVIDER_SITE_OTHER): Payer: Medicaid Other | Admitting: *Deleted

## 2013-09-24 DIAGNOSIS — Z95810 Presence of automatic (implantable) cardiac defibrillator: Secondary | ICD-10-CM

## 2013-09-24 DIAGNOSIS — I519 Heart disease, unspecified: Secondary | ICD-10-CM

## 2013-09-24 NOTE — Progress Notes (Signed)
EPIC Encounter for ICM Monitoring  Patient Name: [name] is a 63 y.o. female Date: 09/24/2013 Primary Care Physican: Imelda Pillow, NP Primary Cardiologist: Marlou Porch Electrophysiologist: Allred Dry Weight: 268 lbs       In the past month, have you:  1. Gained more than 2 pounds in a day or more than 5 pounds in a week? no  2. Had changes in your medications (with verification of current medications)? Yes. The patient is off of lipitor due to a rash. The rash started to resolve once she stopped lipitor. She is on Welchol once daily but she is not sure of the dose.  3. Had more shortness of breath than is usual for you? no  4. Limited your activity because of shortness of breath? no  5. Not been able to sleep because of shortness of breath? no  6. Had increased swelling in your feet or ankles? no  7. Had symptoms of dehydration (dizziness, dry mouth, increased thirst, decreased urine output) no  8. Had changes in sodium restriction? no  9. Been compliant with medication? Yes   ICM trend:   Follow-up plan: ICM clinic phone appointment: 10/26/13- full transmission  Copy of note sent to patient's primary care physician, primary cardiologist, and device following physician.  Alvis Lemmings, RN, BSN 09/24/2013 4:28 PM

## 2013-09-28 ENCOUNTER — Encounter: Payer: Self-pay | Admitting: *Deleted

## 2013-10-08 ENCOUNTER — Telehealth: Payer: Self-pay | Admitting: Cardiology

## 2013-10-08 NOTE — Telephone Encounter (Signed)
Spoke with patient who states she developed a rash back in July, on her abdomen, groin and legs. She attributed it to the Lipitor 80 mg, which she then stopped. The rash is obviously gone, but she does not want to resume Lipitor. Is taking Welchol only. Has appointment with you in November.

## 2013-10-08 NOTE — Telephone Encounter (Signed)
New problem:   Pt has questions about her Cholesterol medication she stopped taking it, caused rash.  Stopped June 2015 to see if that was it.

## 2013-10-11 NOTE — Telephone Encounter (Signed)
Will discuss in clinic. I would like for her to be on statin if possible.

## 2013-10-12 NOTE — Telephone Encounter (Signed)
Left message for pt that Dr Marlou Porch will discuss at office visit in November.  Requested she call back if questions or concerns.

## 2013-10-15 ENCOUNTER — Ambulatory Visit: Payer: Medicaid Other | Admitting: Cardiology

## 2013-10-26 ENCOUNTER — Telehealth: Payer: Self-pay | Admitting: Cardiology

## 2013-10-26 ENCOUNTER — Ambulatory Visit (INDEPENDENT_AMBULATORY_CARE_PROVIDER_SITE_OTHER): Payer: Medicaid Other | Admitting: *Deleted

## 2013-10-26 ENCOUNTER — Encounter: Payer: Self-pay | Admitting: Internal Medicine

## 2013-10-26 DIAGNOSIS — Z95810 Presence of automatic (implantable) cardiac defibrillator: Secondary | ICD-10-CM

## 2013-10-26 DIAGNOSIS — I255 Ischemic cardiomyopathy: Secondary | ICD-10-CM

## 2013-10-26 DIAGNOSIS — I519 Heart disease, unspecified: Secondary | ICD-10-CM

## 2013-10-26 LAB — MDC_IDC_ENUM_SESS_TYPE_REMOTE
Battery Remaining Longevity: 88 mo
Battery Remaining Percentage: 84 %
Brady Statistic AP VS Percent: 1 %
Brady Statistic AS VP Percent: 1 %
Brady Statistic AS VS Percent: 99 %
Brady Statistic RA Percent Paced: 1 %
Date Time Interrogation Session: 20151019163534
HighPow Impedance: 37 Ohm
HighPow Impedance: 37 Ohm
Lead Channel Impedance Value: 480 Ohm
Lead Channel Pacing Threshold Amplitude: 0.75 V
Lead Channel Pacing Threshold Pulse Width: 0.5 ms
Lead Channel Sensing Intrinsic Amplitude: 12 mV
Lead Channel Sensing Intrinsic Amplitude: 2.5 mV
Lead Channel Setting Pacing Amplitude: 2.5 V
Lead Channel Setting Pacing Amplitude: 2.5 V
Lead Channel Setting Pacing Pulse Width: 0.5 ms
Lead Channel Setting Sensing Sensitivity: 0.5 mV
MDC IDC MSMT BATTERY VOLTAGE: 3.02 V
MDC IDC MSMT LEADCHNL RV IMPEDANCE VALUE: 480 Ohm
MDC IDC MSMT LEADCHNL RV PACING THRESHOLD AMPLITUDE: 0.5 V
MDC IDC MSMT LEADCHNL RV PACING THRESHOLD PULSEWIDTH: 0.5 ms
MDC IDC PG SERIAL: 7070007
MDC IDC SET ZONE DETECTION INTERVAL: 250 ms
MDC IDC SET ZONE DETECTION INTERVAL: 300 ms
MDC IDC SET ZONE DETECTION INTERVAL: 350 ms
MDC IDC STAT BRADY AP VP PERCENT: 1 %
MDC IDC STAT BRADY RV PERCENT PACED: 1 %

## 2013-10-26 NOTE — Progress Notes (Signed)
Remote ICD transmission.   

## 2013-10-26 NOTE — Telephone Encounter (Signed)
Spoke with pt and reminded pt of remote transmission that is due today. Pt verbalized understanding.   

## 2013-10-27 NOTE — Addendum Note (Signed)
Addended by: Alvis Lemmings C on: 10/27/2013 12:16 PM   Modules accepted: Level of Service

## 2013-10-27 NOTE — Progress Notes (Signed)
EPIC Encounter for ICM Monitoring  Patient Name: [name] is a 63 y.o. female Date: 10/27/2013 Primary Care Physican: Imelda Pillow, NP Primary Cardiologist: Marlou Porch Electrophysiologist: Allred Dry Weight: 268 lbs       In the past month, have you:  1. Gained more than 2 pounds in a day or more than 5 pounds in a week? Uncertain. The patient does not currently have a working scale at home. She has been encourage to get one and states that she will. She reports that the last time she was weighed was at her PCP's office about a month ago and she was 269 lbs.  2. Had changes in your medications (with verification of current medications)? no  3. Had more shortness of breath than is usual for you? Somewhat. She has had a productive cough that developed about a month ago. She states she has been on inhalers and nebulizers for this. They have helped her symptoms somewhat, but she still has the cough.   4. Limited your activity because of shortness of breath? no  5. Not been able to sleep because of shortness of breath? no  6. Had increased swelling in your feet or ankles? Yes. She has noticed swelling to her feet over the "last few days."   7. Had symptoms of dehydration (dizziness, dry mouth, increased thirst, decreased urine output) no. Her urine output may be down a little per her report, but she is still going fairly regularly. She does not notice that her urine is dark or concentrated in appearance.   8. Had changes in sodium restriction? No. She states she had a pack of "popcorn chips" once this month.  9. Been compliant with medication? Yes   ICM trend:   Follow-up plan: ICM clinic phone appointment: 11/03/13. The patient's corvue readings have been up since ~ 10/1- present. She reports no real change in her diet. Symptoms are fairly well controlled. She takes lasix 80 mg daily (she usually takes this at 4 pm), and spironolactone 25 mg daily. She has not taken her dose of  lasix today. I have advised her that starting tomorrow morning, she needs to take lasix 80 mg BID x 3 days, then return to her present dosing of 80 mg once daily. I will recheck her Corvue reading in 1 week to follow up. She is agreeable. I have advised her to call prior to next Tuesday with any worsening symptoms. She voices understanding.   Copy of note sent to patient's primary care physician, primary cardiologist, and device following physician.  Alvis Lemmings, RN, BSN 10/27/2013 11:54 AM

## 2013-11-03 ENCOUNTER — Telehealth: Payer: Self-pay | Admitting: *Deleted

## 2013-11-03 ENCOUNTER — Encounter: Payer: Self-pay | Admitting: *Deleted

## 2013-11-03 ENCOUNTER — Ambulatory Visit (INDEPENDENT_AMBULATORY_CARE_PROVIDER_SITE_OTHER): Payer: Medicaid Other | Admitting: *Deleted

## 2013-11-03 ENCOUNTER — Encounter: Payer: Self-pay | Admitting: Cardiology

## 2013-11-03 DIAGNOSIS — I519 Heart disease, unspecified: Secondary | ICD-10-CM

## 2013-11-03 DIAGNOSIS — Z95810 Presence of automatic (implantable) cardiac defibrillator: Secondary | ICD-10-CM

## 2013-11-03 NOTE — Telephone Encounter (Signed)
ICM transmission received. I left a message for the patient to call. Alvis Lemmings, RN, BSN  I spoke with the patient. Alvis Lemmings, RN, BSN

## 2013-11-03 NOTE — Progress Notes (Signed)
EPIC Encounter for ICM Monitoring  Patient Name: [name] is a 63 y.o. female Date: 11/03/2013 Primary Care Physican: Imelda Pillow, NP Primary Cardiologist: Marlou Porch Electrophysiologist: Allred Dry Weight: 268 lbs       In the past month, have you:  1. Gained more than 2 pounds in a day or more than 5 pounds in a week? Uncertain- no working scale.  2. Had changes in your medications (with verification of current medications)? Yes. The patient's impedence was below baseline on her transmission last week starting ~ 10/1. She was instructed to take lasix 80 mg BID x 3 days then resume current dosing on her lasix of 80 mg daily. Her impedence has returned to baseline.  3. Had more shortness of breath than is usual for you? No. She has had a persistent cough that is somewhat better this week.  4. Limited your activity because of shortness of breath? No.   5. Not been able to sleep because of shortness of breath? no  6. Had increased swelling in your feet or ankles? no  7. Had symptoms of dehydration (dizziness, dry mouth, increased thirst, decreased urine output) no  8. Had changes in sodium restriction? no  9. Been compliant with medication? Yes   ICM trend:   Follow-up plan: ICM clinic phone appointment: 12/07/13  Copy of note sent to patient's primary care physician, primary cardiologist, and device following physician.  Alvis Lemmings, RN, BSN 11/03/2013 2:17 PM

## 2013-11-05 ENCOUNTER — Encounter: Payer: Self-pay | Admitting: Cardiology

## 2013-11-19 ENCOUNTER — Ambulatory Visit (INDEPENDENT_AMBULATORY_CARE_PROVIDER_SITE_OTHER): Payer: Medicaid Other | Admitting: Cardiology

## 2013-11-19 ENCOUNTER — Encounter: Payer: Self-pay | Admitting: Cardiology

## 2013-11-19 ENCOUNTER — Telehealth: Payer: Self-pay | Admitting: Cardiology

## 2013-11-19 ENCOUNTER — Ambulatory Visit (INDEPENDENT_AMBULATORY_CARE_PROVIDER_SITE_OTHER): Payer: Medicaid Other

## 2013-11-19 VITALS — BP 132/84 | HR 64 | Ht 64.0 in | Wt 263.0 lb

## 2013-11-19 DIAGNOSIS — E785 Hyperlipidemia, unspecified: Secondary | ICD-10-CM

## 2013-11-19 DIAGNOSIS — I255 Ischemic cardiomyopathy: Secondary | ICD-10-CM

## 2013-11-19 DIAGNOSIS — I519 Heart disease, unspecified: Secondary | ICD-10-CM

## 2013-11-19 DIAGNOSIS — I2583 Coronary atherosclerosis due to lipid rich plaque: Secondary | ICD-10-CM

## 2013-11-19 DIAGNOSIS — Z23 Encounter for immunization: Secondary | ICD-10-CM

## 2013-11-19 DIAGNOSIS — I10 Essential (primary) hypertension: Secondary | ICD-10-CM

## 2013-11-19 DIAGNOSIS — I2510 Atherosclerotic heart disease of native coronary artery without angina pectoris: Secondary | ICD-10-CM

## 2013-11-19 NOTE — Telephone Encounter (Signed)
Pharmacy in computer systerm

## 2013-11-19 NOTE — Patient Instructions (Signed)
Your physician recommends that you schedule a follow-up appointment in: 4 months with Dr Marlou Porch

## 2013-11-19 NOTE — Telephone Encounter (Signed)
New problem   Pt pharmacy is [facility] phone [phone].

## 2013-11-19 NOTE — Progress Notes (Signed)
[facility] [address], [place]  [address] Phone: [phone] Fax:  [phone]  Date:  11/19/2013   ID:  [name], DOB 03-19-1950, MRN [id number]  PCP:  Imelda Pillow, NP   History of Present Illness: [name] is a 63 y.o. female with cardiomyopathy EF 25-30%, ICD, CABG 2007, cardiac catheterization December of 2011 with widely patent grafts, stress test 2013 large defect prior infarct pattern, EF 31%.  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  11/06/12-does complain about angina with exertion. Overall though she is feeling quite well. She still wishes she could breathe better.  12/08/12 - Imdur has helped she says. No CP. No SOB. She still feels somewhat exhausted after prolonged exertional activity. Overall improved however.  03/10/13-reviewed Dr. Rayann Heman. The lead was unsuccessful due to scarring along the lateral wall of the left ventricle and a few venous targets. Given her obesity, she is a poor candidate for epicardial LV lead placement though this is an option down the Road.  06/10/13-overall doing quite well currently. At one point between her visits, she did have to increase her Lasix but now she is back on steady state and doing well. She does have mild shortness of breath with moderate activity. No change. No orthopnea.  11/19/13 - Doing well. No SOB. Drivers lis pass. No syncope, no angina.   Wt Readings from Last 3 Encounters:  11/19/13 263 lb (119.296 kg)  06/10/13 268 lb (121.564 kg)  05/14/13 267 lb (121.11 kg)     Past Medical History  Diagnosis Date  . Coronary artery disease     s/p CABG 2007  . Sleep apnea     uses O2 at night  . Ischemic cardiomyopathy     EF 30-35%, s/p ICD 4/08 by Dr Leonia Reeves  . Diabetes mellitus     type II  . Hypertension   . Obesity   . CHF (congestive heart failure)   . Oxygen dependent 2 L  . Morbid obesity 11/06/2012  . Angina decubitus 11/06/2012    Past Surgical History  Procedure  Laterality Date  . Cardiac catheterization    . Cardiac defibrillator placement  4/08    by Dr Leonia Reeves (MDT)  . Coronary artery bypass graft  2007  . Implantable cardioverter defibrillator implant  09/25/12    attempt of upgrade to CRT-D unsuccessful due to CS anatomy, SJM Unify Asaura device placed with LV port capped by Dr Rayann Heman    Current Outpatient Prescriptions  Medication Sig Dispense Refill  . albuterol (PROVENTIL HFA;VENTOLIN HFA) 108 (90 BASE) MCG/ACT inhaler Inhale 2 puffs into the lungs every 6 (six) hours as needed for wheezing or shortness of breath.    Marland Kitchen aspirin EC 81 MG EC tablet Take 1 tablet (81 mg total) by mouth daily. 30 tablet 12  . carvedilol (COREG) 25 MG tablet Take 25 mg by mouth 2 (two) times daily with a meal.    . furosemide (LASIX) 80 MG tablet Take 80 mg by mouth daily.    . insulin lispro protamine-lispro (HUMALOG MIX 50/50) (50-50) 100 UNIT/ML SUSP injection Inject 100 Units into the skin. 100 units in the morning, 85 units at lunch, 85 units at night    . Iron-Vitamin C (IRON 100/C PO) Take by mouth.    . isosorbide dinitrate (ISORDIL) 5 MG tablet Take 5 mg by mouth 3 (three) times daily.    Marland Kitchen lisinopril (PRINIVIL,ZESTRIL) 10 MG tablet Take 1  tablet (10 mg total) by mouth daily. 30 tablet 12  . spironolactone (ALDACTONE) 25 MG tablet Take 25 mg by mouth daily.    . STUDY MEDICATION Bococizumab or placebo SQ q 2 weeks (SPIRE-LDL with [employer]) lipid lowering study     No current facility-administered medications for this visit.    Allergies:    Allergies  Allergen Reactions  . Metformin And Related Itching, Swelling and Other (See Comments)    Leg pain & swelling in legs    Social History:  The patient  reports that she has never smoked. She does not have any smokeless tobacco history on file. She reports that she does not drink alcohol or use illicit drugs.   ROS:  Please see the history of present illness.   Denies any syncope, bleeding,  orthopnea, PND  PHYSICAL EXAM: VS:  BP 132/84 mmHg  Pulse 64  Ht 5\' 4"  (1.626 m)  Wt 263 lb (119.296 kg)  BMI 45.12 kg/m2 Well nourished, well developed, in no acute distress HEENT: normal Neck: no JVD Cardiac:  normal S1, S2; RRR; no murmur Lungs:  clear to auscultation bilaterally, no wheezing, rhonchi or rales Abd: soft, nontender, no hepatomegalyObese Ext: no edema Skin: warm and dry Neuro: no focal abnormalities noted  EKG: 11/06/12: Left bundle branch block, 68, sinus rhythm, no change from prior 06/10/13-sinus rhythm, 63, left bundle branch block Cardiac angiogram-2011-widely patent grafts Nuclear stress test-2013-no ischemia, old infarct, EF 31%  ASSESSMENT AND PLAN:  1. Chronic systolic heart failure-fairly well compensated. NYHA class 2-3. Continue to advocate dietary compliance, low-salt diet, fluid restrictions.  2. Left bundle branch block-unable to place biventricular lead due to vein sclerosis. 3. Morbid obesity-encourage weight loss. Low carbohydrate diet.Discussed at length today. 4. Angina-chest pain with exertion has improved greatly with addition of isosorbide. Prior catheterization reassuring. Nuclear stress test reassuring. 5. Hypertension-currently well controlled 6. Diabetes-continue to work hard with your primary physician. 7. We will see her back in four months.  Signed, Candee Furbish, MD [facility]  11/19/2013 8:44 AM

## 2013-11-26 ENCOUNTER — Emergency Department (HOSPITAL_COMMUNITY)
Admission: EM | Admit: 2013-11-26 | Discharge: 2013-11-26 | Disposition: A | Discharge status: Home / Self Care | Payer: Medicaid Other | Attending: Emergency Medicine | Admitting: Emergency Medicine

## 2013-11-26 ENCOUNTER — Encounter (HOSPITAL_COMMUNITY): Payer: Self-pay | Admitting: Emergency Medicine

## 2013-11-26 ENCOUNTER — Emergency Department (HOSPITAL_COMMUNITY): Payer: Medicaid Other

## 2013-11-26 DIAGNOSIS — Z79899 Other long term (current) drug therapy: Secondary | ICD-10-CM | POA: Insufficient documentation

## 2013-11-26 DIAGNOSIS — R059 Cough, unspecified: Secondary | ICD-10-CM

## 2013-11-26 DIAGNOSIS — I509 Heart failure, unspecified: Secondary | ICD-10-CM | POA: Insufficient documentation

## 2013-11-26 DIAGNOSIS — I10 Essential (primary) hypertension: Secondary | ICD-10-CM | POA: Diagnosis not present

## 2013-11-26 DIAGNOSIS — I2510 Atherosclerotic heart disease of native coronary artery without angina pectoris: Secondary | ICD-10-CM | POA: Insufficient documentation

## 2013-11-26 DIAGNOSIS — Z7982 Long term (current) use of aspirin: Secondary | ICD-10-CM | POA: Diagnosis not present

## 2013-11-26 DIAGNOSIS — J209 Acute bronchitis, unspecified: Secondary | ICD-10-CM | POA: Diagnosis not present

## 2013-11-26 DIAGNOSIS — E119 Type 2 diabetes mellitus without complications: Secondary | ICD-10-CM | POA: Diagnosis not present

## 2013-11-26 DIAGNOSIS — Z95810 Presence of automatic (implantable) cardiac defibrillator: Secondary | ICD-10-CM | POA: Insufficient documentation

## 2013-11-26 DIAGNOSIS — E669 Obesity, unspecified: Secondary | ICD-10-CM | POA: Insufficient documentation

## 2013-11-26 DIAGNOSIS — R05 Cough: Secondary | ICD-10-CM | POA: Diagnosis present

## 2013-11-26 DIAGNOSIS — Z951 Presence of aortocoronary bypass graft: Secondary | ICD-10-CM | POA: Diagnosis not present

## 2013-11-26 DIAGNOSIS — R062 Wheezing: Secondary | ICD-10-CM

## 2013-11-26 LAB — I-STAT CHEM 8, ED
BUN: 18 mg/dL (ref 6–23)
CREATININE: 1.3 mg/dL — AB (ref 0.50–1.10)
Calcium, Ion: 1.18 mmol/L (ref 1.13–1.30)
Chloride: 99 mEq/L (ref 96–112)
GLUCOSE: 218 mg/dL — AB (ref 70–99)
HEMATOCRIT: 45 % (ref 36.0–46.0)
Hemoglobin: 15.3 g/dL — ABNORMAL HIGH (ref 12.0–15.0)
POTASSIUM: 5.1 meq/L (ref 3.7–5.3)
SODIUM: 137 meq/L (ref 137–147)
TCO2: 29 mmol/L (ref 0–100)

## 2013-11-26 LAB — CBC WITH DIFFERENTIAL/PLATELET
BASOS PCT: 1 % (ref 0–1)
Basophils Absolute: 0.1 10*3/uL (ref 0.0–0.1)
EOS ABS: 0.4 10*3/uL (ref 0.0–0.7)
EOS PCT: 5 % (ref 0–5)
HCT: 41.2 % (ref 36.0–46.0)
Hemoglobin: 13.2 g/dL (ref 12.0–15.0)
Lymphocytes Relative: 37 % (ref 12–46)
Lymphs Abs: 2.9 10*3/uL (ref 0.7–4.0)
MCH: 28.9 pg (ref 26.0–34.0)
MCHC: 32 g/dL (ref 30.0–36.0)
MCV: 90.4 fL (ref 78.0–100.0)
Monocytes Absolute: 0.6 10*3/uL (ref 0.1–1.0)
Monocytes Relative: 8 % (ref 3–12)
Neutro Abs: 3.7 10*3/uL (ref 1.7–7.7)
Neutrophils Relative %: 49 % (ref 43–77)
Platelets: 170 10*3/uL (ref 150–400)
RBC: 4.56 MIL/uL (ref 3.87–5.11)
RDW: 15 % (ref 11.5–15.5)
WBC: 7.7 10*3/uL (ref 4.0–10.5)

## 2013-11-26 LAB — BASIC METABOLIC PANEL
Anion gap: 13 (ref 5–15)
BUN: 17 mg/dL (ref 6–23)
CO2: 27 mEq/L (ref 19–32)
CREATININE: 1.18 mg/dL — AB (ref 0.50–1.10)
Calcium: 9.6 mg/dL (ref 8.4–10.5)
Chloride: 98 mEq/L (ref 96–112)
GFR, EST AFRICAN AMERICAN: 56 mL/min — AB (ref >90)
GFR, EST NON AFRICAN AMERICAN: 48 mL/min — AB (ref >90)
GLUCOSE: 213 mg/dL — AB (ref 70–99)
Potassium: 5.5 mEq/L — ABNORMAL HIGH (ref 3.7–5.3)
Sodium: 138 mEq/L (ref 137–147)

## 2013-11-26 LAB — PRO B NATRIURETIC PEPTIDE: Pro B Natriuretic peptide (BNP): 969.8 pg/mL — ABNORMAL HIGH (ref 0–125)

## 2013-11-26 MED ORDER — ALBUTEROL SULFATE (2.5 MG/3ML) 0.083% IN NEBU
5.0000 mg | INHALATION_SOLUTION | Freq: Once | RESPIRATORY_TRACT | Status: AC
  Administered 2013-11-26: 5 mg via RESPIRATORY_TRACT
  Filled 2013-11-26: qty 6

## 2013-11-26 MED ORDER — IPRATROPIUM BROMIDE 0.02 % IN SOLN
0.5000 mg | Freq: Once | RESPIRATORY_TRACT | Status: AC
  Administered 2013-11-26: 0.5 mg via RESPIRATORY_TRACT
  Filled 2013-11-26: qty 2.5

## 2013-11-26 MED ORDER — ALBUTEROL SULFATE HFA 108 (90 BASE) MCG/ACT IN AERS
2.0000 | INHALATION_SPRAY | RESPIRATORY_TRACT | Status: DC | PRN
  Filled 2013-11-26: qty 6.7

## 2013-11-26 MED ORDER — AZITHROMYCIN 250 MG PO TABS: ORAL_TABLET | ORAL | Status: DC

## 2013-11-26 MED ORDER — AEROCHAMBER PLUS W/MASK MISC: 1.0000 | Freq: Once | Status: DC

## 2013-11-26 NOTE — ED Notes (Signed)
Patient transported to X-ray 

## 2013-11-26 NOTE — ED Notes (Signed)
Patient is alert and orientedx4.  Patient was explained discharge instructions and they understood them with no questions.  The patient's son, Beila Purdie is taking the patient home.

## 2013-11-26 NOTE — ED Notes (Signed)
Dr. Milagros Evener at the bedside.

## 2013-11-26 NOTE — Discharge Instructions (Signed)
Acute Bronchitis  Use your inhaler 2 puffs every 4 hours as needed for cough or shortness of breath. Return or see your tremor care physician if needed more than every 4 hours. Take the antibiotic as prescribed starting today Bronchitis is when the airways that extend from the windpipe into the lungs get red, puffy, and painful (inflamed). Bronchitis often causes thick spit (mucus) to develop. This leads to a cough. A cough is the most common symptom of bronchitis. In acute bronchitis, the condition usually begins suddenly and goes away over time (usually in 2 weeks). Smoking, allergies, and asthma can make bronchitis worse. Repeated episodes of bronchitis may cause more lung problems. HOME CARE  Rest.  Drink enough fluids to keep your pee (urine) clear or pale yellow (unless you need to limit fluids as told by your doctor).  Only take over-the-counter or prescription medicines as told by your doctor.  Avoid smoking and secondhand smoke. These can make bronchitis worse. If you are a smoker, think about using nicotine gum or skin patches. Quitting smoking will help your lungs heal faster.  Reduce the chance of getting bronchitis again by:  Washing your hands often.  Avoiding people with cold symptoms.  Trying not to touch your hands to your mouth, nose, or eyes.  Follow up with your doctor as told. GET HELP IF: Your symptoms do not improve after 1 week of treatment. Symptoms include:  Cough.  Fever.  Coughing up thick spit.  Body aches.  Chest congestion.  Chills.  Shortness of breath.  Sore throat. GET HELP RIGHT AWAY IF:   You have an increased fever.  You have chills.  You have severe shortness of breath.  You have bloody thick spit (sputum).  You throw up (vomit) often.  You lose too much body fluid (dehydration).  You have a severe headache.  You faint. MAKE SURE YOU:   Understand these instructions.  Will watch your condition.  Will get help right  away if you are not doing well or get worse. Document Released: 06/13/2007 Document Revised: 08/27/2012 Document Reviewed: 06/17/2012 [facility] Patient Information 2015 [place], [place]. This information is not intended to replace advice given to you by your health care provider. Make sure you discuss any questions you have with your health care provider.

## 2013-11-26 NOTE — ED Provider Notes (Signed)
CSN: [id number]     Arrival date & time 11/26/13  0111 History   None    Chief Complaint  Patient presents with  . Cough  . Wheezing     (Consider location/radiation/quality/duration/timing/severity/associated sxs/prior Treatment) HPI Complains of cough productive of yellow sputum and shortness of breath for one month. She presents today she became mildly lightheaded tonight lightheadedness lasted for 30 minutes, resolve spontaneously. Her shortness of breath is mild at present. She denies other symptoms. She is treated herself withalbuterol with partial relief. She denies fever , denies orthopnea. Sleeps on one pillowdenies other associated symptoms.  Past Medical History  Diagnosis Date  . Coronary artery disease     s/p CABG 2007  . Sleep apnea     uses O2 at night  . Ischemic cardiomyopathy     EF 30-35%, s/p ICD 4/08 by Dr Leonia Reeves  . Diabetes mellitus     type II  . Hypertension   . Obesity   . CHF (congestive heart failure)   . Oxygen dependent 2 L  . Morbid obesity 11/06/2012  . Angina decubitus 11/06/2012   Past Surgical History  Procedure Laterality Date  . Cardiac catheterization    . Cardiac defibrillator placement  4/08    by Dr Leonia Reeves ([facility]T)  . Coronary artery bypass graft  2007  . Implantable cardioverter defibrillator implant  09/25/12    attempt of upgrade to CRT-D unsuccessful due to CS anatomy, SJM Unify Asaura device placed with LV port capped by Dr Rayann Heman   Family History  Problem Relation Age of Onset  . Diabetes Mother 60    died - HTN  . Other      No early family hx of CAD   History  Substance Use Topics  . Smoking status: Never Smoker   . Smokeless tobacco: Not on file  . Alcohol Use: No   OB History    No data available     Review of Systems  Constitutional: Negative.   HENT: Negative.   Respiratory: Positive for cough and shortness of breath.   Cardiovascular: Negative.   Gastrointestinal: Negative.   Musculoskeletal: Negative.    Skin: Negative.   Neurological: Positive for light-headedness.  Psychiatric/Behavioral: Negative.   All other systems reviewed and are negative.     Allergies  Metformin and related  Home Medications   Prior to Admission medications   Medication Sig Start Date End Date Taking? Authorizing Provider  albuterol (PROVENTIL HFA;VENTOLIN HFA) 108 (90 BASE) MCG/ACT inhaler Inhale 2 puffs into the lungs every 6 (six) hours as needed for wheezing or shortness of breath.    Historical Provider, MD  aspirin EC 81 MG EC tablet Take 1 tablet (81 mg total) by mouth daily. 09/11/12   Candee Furbish, MD  carvedilol (COREG) 25 MG tablet Take 25 mg by mouth 2 (two) times daily with a meal.    Historical Provider, MD  furosemide (LASIX) 80 MG tablet Take 80 mg by mouth daily. 09/11/12   Candee Furbish, MD  insulin lispro protamine-lispro (HUMALOG MIX 50/50) (50-50) 100 UNIT/ML SUSP injection Inject 100 Units into the skin. 100 units in the morning, 85 units at lunch, 85 units at night    Historical Provider, MD  Iron-Vitamin C (IRON 100/C PO) Take by mouth.    Historical Provider, MD  isosorbide dinitrate (ISORDIL) 5 MG tablet Take 5 mg by mouth 3 (three) times daily.    Historical Provider, MD  lisinopril (PRINIVIL,ZESTRIL) 10 MG tablet Take 1  tablet (10 mg total) by mouth daily. 09/11/12   Candee Furbish, MD  spironolactone (ALDACTONE) 25 MG tablet Take 25 mg by mouth daily.    Historical Provider, MD  STUDY MEDICATION Bococizumab or placebo SQ q 2 weeks (SPIRE-LDL with [employer]) lipid lowering study    Historical Provider, MD   BP 113/68 mmHg  Pulse 73  Temp(Src) 97.5 F (36.4 C) (Oral)  Resp 26  SpO2 93% Physical Exam  Constitutional: She appears well-developed and well-nourished.  HENT:  Head: Normocephalic and atraumatic.  Eyes: Conjunctivae are normal. Pupils are equal, round, and reactive to light.  Neck: Neck supple. No tracheal deviation present. No thyromegaly present.  Cardiovascular: Normal rate  and regular rhythm.   No murmur heard. Pulmonary/Chest: Effort normal. She has no wheezes.  Mild end expiratory wheezes . speaks in paragraphs  Abdominal: Soft. Bowel sounds are normal. She exhibits no distension. There is no tenderness.  Musculoskeletal: Normal range of motion. She exhibits no edema or tenderness.  Neurological: She is alert. Coordination normal.  Skin: Skin is warm and dry. No rash noted.  Psychiatric: She has a normal mood and affect.  Nursing note and vitals reviewed.   ED Course  Procedures (including critical care time) Labs Review Labs Reviewed  BASIC METABOLIC PANEL  CBC WITH DIFFERENTIAL  PRO B NATRIURETIC PEPTIDE  I-STAT CHEM 8, ED    Imaging Review Dg Chest 2 View  11/26/2013   CLINICAL DATA:  Cough for 1 week  EXAM: CHEST  2 VIEW  COMPARISON:  09/26/2012  FINDINGS: Defibrillator is again noted. Cardiomegaly is again seen. Postoperative changes are stable. Linear scarring is noted in the right mid lung stable from the previous exam. No focal infiltrate or sizable effusion is seen. Increased peribronchial markings are noted which may be related to bronchitis. No other focal abnormality is seen.  IMPRESSION: Increased central peribronchial markings.   Electronically Signed   By: Inez Catalina M.D.   On: 11/26/2013 01:57     EKG Interpretation None      Date: 11/26/2013  Rate: 80  Rhythm: normal sinus rhythm  QRS Axis: left  Intervals: normal  ST/T Wave abnormalities: nonspecific ST/T changes  Conduction Disutrbances:left bundle branch block  Narrative Interpretation:   Old EKG Reviewed: no significant change from 09/26/2012 interpreted by me  Chest x-ray viewed by me Patient's breathing is at baseline after one nebulized treatment. She states she is breathing normally. Results for orders placed or performed during the hospital encounter of 41/28/78  Basic metabolic panel  Result Value Ref Range   Sodium 138 137 - 147 mEq/L   Potassium 5.5 (H)  3.7 - 5.3 mEq/L   Chloride 98 96 - 112 mEq/L   CO2 27 19 - 32 mEq/L   Glucose, Bld 213 (H) 70 - 99 mg/dL   BUN 17 6 - 23 mg/dL   Creatinine, Ser 1.18 (H) 0.50 - 1.10 mg/dL   Calcium 9.6 8.4 - 10.5 mg/dL   GFR calc non Af Amer 48 (L) >90 mL/min   GFR calc Af Amer 56 (L) >90 mL/min   Anion gap 13 5 - 15  CBC with Differential  Result Value Ref Range   WBC 7.7 4.0 - 10.5 K/uL   RBC 4.56 3.87 - 5.11 MIL/uL   Hemoglobin 13.2 12.0 - 15.0 g/dL   HCT 41.2 36.0 - 46.0 %   MCV 90.4 78.0 - 100.0 fL   MCH 28.9 26.0 - 34.0 pg   MCHC 32.0 30.0 -  36.0 g/dL   RDW 15.0 11.5 - 15.5 %   Platelets 170 150 - 400 K/uL   Neutrophils Relative % 49 43 - 77 %   Neutro Abs 3.7 1.7 - 7.7 K/uL   Lymphocytes Relative 37 12 - 46 %   Lymphs Abs 2.9 0.7 - 4.0 K/uL   Monocytes Relative 8 3 - 12 %   Monocytes Absolute 0.6 0.1 - 1.0 K/uL   Eosinophils Relative 5 0 - 5 %   Eosinophils Absolute 0.4 0.0 - 0.7 K/uL   Basophils Relative 1 0 - 1 %   Basophils Absolute 0.1 0.0 - 0.1 K/uL  Pro b natriuretic peptide (BNP)  Result Value Ref Range   Pro B Natriuretic peptide (BNP) 969.8 (H) 0 - 125 pg/mL  I-stat chem 8, ed  Result Value Ref Range   Sodium 137 137 - 147 mEq/L   Potassium 5.1 3.7 - 5.3 mEq/L   Chloride 99 96 - 112 mEq/L   BUN 18 6 - 23 mg/dL   Creatinine, Ser 1.30 (H) 0.50 - 1.10 mg/dL   Glucose, Bld 218 (H) 70 - 99 mg/dL   Calcium, Ion 1.18 1.13 - 1.30 mmol/L   TCO2 29 0 - 100 mmol/L   Hemoglobin 15.3 (H) 12.0 - 15.0 g/dL   HCT 45.0 36.0 - 46.0 %   Dg Chest 2 View  11/26/2013   CLINICAL DATA:  Cough for 1 week  EXAM: CHEST  2 VIEW  COMPARISON:  09/26/2012  FINDINGS: Defibrillator is again noted. Cardiomegaly is again seen. Postoperative changes are stable. Linear scarring is noted in the right mid lung stable from the previous exam. No focal infiltrate or sizable effusion is seen. Increased peribronchial markings are noted which may be related to bronchitis. No other focal abnormality is seen.   IMPRESSION: Increased central peribronchial markings.   Electronically Signed   By: Inez Catalina M.D.   On: 11/26/2013 01:57    MDM  Patient's family member says she does not use her albuterol inhaler properly. I've asked respiratory therapy to teach patient to use albuterol HFA with spacer. She'll also be prescribed azithromycin. Her BNP is about at baseline.don't think she is in acute congestive heart failure. She lies flat with out difficulty. Final diagnoses:  Cough  Wheezing   Symptoms most consistent with bronchitis Diagnoses #1 bronchitis #2 hyperglycemia    Orlie Dakin, MD 11/26/13 [phone]

## 2013-11-26 NOTE — ED Notes (Signed)
Pt. reports productive cough and wheezing  unrelieved by rescue MDI , denies fever or chills.

## 2013-11-26 NOTE — ED Notes (Signed)
Family at bedside. 

## 2013-11-26 NOTE — ED Notes (Signed)
Contacted Respiratory and advised them MD wants her to teach the patient how to use the spacer with the inhaler.

## 2013-12-07 ENCOUNTER — Ambulatory Visit (INDEPENDENT_AMBULATORY_CARE_PROVIDER_SITE_OTHER): Payer: Medicaid Other | Admitting: *Deleted

## 2013-12-07 DIAGNOSIS — I519 Heart disease, unspecified: Secondary | ICD-10-CM

## 2013-12-07 DIAGNOSIS — Z95810 Presence of automatic (implantable) cardiac defibrillator: Secondary | ICD-10-CM

## 2013-12-10 ENCOUNTER — Encounter: Payer: Self-pay | Admitting: *Deleted

## 2013-12-10 NOTE — Progress Notes (Signed)
EPIC Encounter for ICM Monitoring  Patient Name: [name] is a 63 y.o. female Date: 12/10/2013 Primary Care Physican: Imelda Pillow, NP Primary Cardiologist: Marlou Porch Electrophysiologist: Allred Dry Weight: 268 lbs       In the past month, have you:  1. Gained more than 2 pounds in a day or more than 5 pounds in a week? no  2. Had changes in your medications (with verification of current medications)? no  3. Had more shortness of breath than is usual for you? no  4. Limited your activity because of shortness of breath? no  5. Not been able to sleep because of shortness of breath? no  6. Had increased swelling in your feet or ankles? no  7. Had symptoms of dehydration (dizziness, dry mouth, increased thirst, decreased urine output) no  8. Had changes in sodium restriction? no  9. Been compliant with medication? Yes   ICM trend:   Follow-up plan: ICM clinic phone appointment: 02/01/14. The patient has a follow up appointment on 12/30/13 with Dr. Rayann Heman.  Copy of note sent to patient's primary care physician, primary cardiologist, and device following physician.  Alvis Lemmings, RN, BSN 12/10/2013 1:31 PM

## 2013-12-17 ENCOUNTER — Encounter (HOSPITAL_COMMUNITY): Payer: Self-pay | Admitting: Internal Medicine

## 2013-12-30 ENCOUNTER — Encounter: Payer: Self-pay | Admitting: Internal Medicine

## 2013-12-30 ENCOUNTER — Ambulatory Visit (INDEPENDENT_AMBULATORY_CARE_PROVIDER_SITE_OTHER): Payer: Medicaid Other | Admitting: Internal Medicine

## 2013-12-30 VITALS — BP 110/64 | HR 69 | Ht 65.0 in | Wt 262.8 lb

## 2013-12-30 DIAGNOSIS — I519 Heart disease, unspecified: Secondary | ICD-10-CM

## 2013-12-30 DIAGNOSIS — I255 Ischemic cardiomyopathy: Secondary | ICD-10-CM

## 2013-12-30 DIAGNOSIS — I447 Left bundle-branch block, unspecified: Secondary | ICD-10-CM

## 2013-12-30 LAB — MDC_IDC_ENUM_SESS_TYPE_INCLINIC
Battery Remaining Longevity: 93.6 mo
HighPow Impedance: 39.5927
HighPow Impedance: 40 Ohm
Implantable Pulse Generator Serial Number: 7070007
Lead Channel Impedance Value: 475 Ohm
Lead Channel Pacing Threshold Amplitude: 0.5 V
Lead Channel Pacing Threshold Pulse Width: 0.5 ms
Lead Channel Pacing Threshold Pulse Width: 0.5 ms
Lead Channel Sensing Intrinsic Amplitude: 2.1 mV
Lead Channel Setting Pacing Amplitude: 2 V
Lead Channel Setting Pacing Pulse Width: 0.5 ms
Lead Channel Setting Sensing Sensitivity: 0.5 mV
MDC IDC MSMT LEADCHNL RA PACING THRESHOLD AMPLITUDE: 0.75 V
MDC IDC MSMT LEADCHNL RV IMPEDANCE VALUE: 487.5 Ohm
MDC IDC MSMT LEADCHNL RV SENSING INTR AMPL: 12 mV
MDC IDC SESS DTM: 20151223150540
MDC IDC SET LEADCHNL RV PACING AMPLITUDE: 2.5 V
MDC IDC SET ZONE DETECTION INTERVAL: 300 ms
MDC IDC STAT BRADY RA PERCENT PACED: 0.17 %
MDC IDC STAT BRADY RV PERCENT PACED: 0 %
Zone Setting Detection Interval: 250 ms
Zone Setting Detection Interval: 350 ms

## 2013-12-30 NOTE — Patient Instructions (Addendum)
Your physician wants you to follow-up in: 12 Months with Dr. Rayann Heman. You will receive a reminder letter in the mail two months in advance. If you don't receive a letter, please call our office to schedule the follow-up appointment.  Remote monitoring is used to monitor your Pacemaker or ICD from home. This monitoring reduces the number of office visits required to check your device to one time per year. It allows [name] to keep an eye on the functioning of your device to ensure it is working properly. You are scheduled for a device check from home on 03/31/2014. You may send your transmission at any time that day. If you have a wireless device, the transmission will be sent automatically. After your physician reviews your transmission, you will receive a postcard with your next transmission date.   Alvis Lemmings, RN will contact you regarding you on 02/01/2014.

## 2013-12-31 NOTE — Progress Notes (Signed)
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 female who presents today for electrophysiology followup.  Since her last visit, she has done well.  Her SOB is stable.  She has made little effort at weight reduction.  Today, she denies symptoms of palpitations, chest pain,  dizziness, presyncope, syncope, or ICD shocks.  The patient is otherwise without complaint today.   Past Medical History  Diagnosis Date  . Coronary artery disease     s/p CABG 2007  . Sleep apnea     uses O2 at night  . Ischemic cardiomyopathy     EF 30-35%, s/p ICD 4/08 by Dr Leonia Reeves  . Diabetes mellitus     type II  . Hypertension   . Obesity   . CHF (congestive heart failure)   . Oxygen dependent 2 L  . Morbid obesity 11/06/2012  . Angina decubitus 11/06/2012   Past Surgical History  Procedure Laterality Date  . Cardiac catheterization    . Cardiac defibrillator placement  4/08    by Dr Leonia Reeves ([facility])  . Coronary artery bypass graft  2007  . Implantable cardioverter defibrillator implant  09/25/12    attempt of upgrade to CRT-D unsuccessful due to CS anatomy, SJM Unify Asaura device placed with LV port capped by Dr Rayann Heman  . Bi-ventricular implantable cardioverter defibrillator upgrade N/A 09/25/2012    Procedure: BI-VENTRICULAR IMPLANTABLE CARDIOVERTER DEFIBRILLATOR UPGRADE;  Surgeon: Coralyn Mark, MD;  Location: [facility] CATH LAB;  Service: Cardiovascular;  Laterality: N/A;    Current Outpatient Prescriptions  Medication Sig Dispense Refill  . albuterol (PROVENTIL HFA;VENTOLIN HFA) 108 (90 BASE) MCG/ACT inhaler Inhale 2 puffs into the lungs every 6 (six) hours as needed for wheezing or shortness of breath.    Marland Kitchen aspirin EC 81 MG EC tablet Take 1 tablet (81 mg total) by mouth daily. 30 tablet 12  . Atorvastatin Calcium (LIPITOR PO) Take 80 mg by mouth daily.     . carvedilol (COREG) 25 MG tablet Take 25 mg by mouth 2 (two) times daily with a meal.    . furosemide  (LASIX) 80 MG tablet Take 80 mg by mouth daily.    . insulin lispro (HUMALOG) 100 UNIT/ML injection Inject 85-100 Units into the skin 3 (three) times daily before meals. Use 100 mg in the morning and 85 units with lunch and supper    . Iron-Vitamin C (IRON 100/C PO) Take by mouth.    . isosorbide dinitrate (ISORDIL) 5 MG tablet Take 5 mg by mouth daily.     Marland Kitchen lisinopril (PRINIVIL,ZESTRIL) 10 MG tablet Take 1 tablet (10 mg total) by mouth daily. 30 tablet 12  . spironolactone (ALDACTONE) 25 MG tablet Take 25 mg by mouth daily.     No current facility-administered medications for this visit.    Physical Exam: Filed Vitals:   12/30/13 1413  BP: 110/64  Pulse: 69  Height: 5\' 5"  (1.651 m)  Weight: 262 lb 12.8 oz (119.205 kg)    GEN- The patient is chronically ill and obese appearing, alert and oriented x 3 today.   Head- normocephalic, atraumatic Eyes-  Sclera clear, conjunctiva pink Ears- hearing intact Oropharynx- clear Lungs- Clear to ausculation bilaterally, normal work of breathing Chest- ICD pocket is well healed Heart- Regular rate and rhythm, no murmurs, rubs or gallops, PMI not laterally displaced GI- soft, NT, ND, + BS Extremities- no clubbing, cyanosis, +2 edema Neuro- strength/ sensation are intact  ICD  interrogation- reviewed in detail today,  See PACEART report  Assessment and Plan:  1.  Chronic systolic dysfunction The patient has chronic systolic dysfunction, NYHA Class III CHF, and LBBB.  LV lead was unsuccessful due to scaring along the lateral was of the LV and few venous targets.  She has a BiV Device with the LV port plugged.   Today, I again discussed epicardial (surgical) lead placement.  SHe is not interested at this time. Normal ICD function.  See Claudia Desanctis Art report Alvis Lemmings to follow coreview in the ICM device clinic No changes today  2 CAD/ ischemic CM No ischemic symptoms Continue medical therapy  3. LBBB As above  4. morbid obesity She  would do much better with aggressive lifestyle modification/ weight reduction Unfortunately, she is not interested at this time  Merlin Return to see me in 12 months

## 2014-01-13 ENCOUNTER — Telehealth: Payer: Self-pay | Admitting: Internal Medicine

## 2014-01-13 NOTE — Telephone Encounter (Signed)
Spoke with patient and let her know we are looking in Feb to schedule but I need to coordinate with [facility].  I have left a message with Levonne Spiller, RN at the office to call me back to schedule

## 2014-01-13 NOTE — Telephone Encounter (Signed)
New message      Pt is ready to proceed with procedure Dr Rayann Heman want her to have.  Please call

## 2014-01-14 NOTE — Telephone Encounter (Signed)
Dr Roxy Manns wants to see the patient in consultation first.  Thurmond Butts is going to set this up and we will coordinate the lead placement

## 2014-02-01 ENCOUNTER — Ambulatory Visit (INDEPENDENT_AMBULATORY_CARE_PROVIDER_SITE_OTHER): Payer: Medicaid Other | Admitting: *Deleted

## 2014-02-01 DIAGNOSIS — Z95810 Presence of automatic (implantable) cardiac defibrillator: Secondary | ICD-10-CM | POA: Diagnosis not present

## 2014-02-01 DIAGNOSIS — I519 Heart disease, unspecified: Secondary | ICD-10-CM | POA: Diagnosis not present

## 2014-02-02 ENCOUNTER — Encounter: Payer: Self-pay | Admitting: *Deleted

## 2014-02-02 NOTE — Progress Notes (Signed)
EPIC Encounter for ICM Monitoring  Patient Name: [name] is a 64 y.o. female Date: 02/02/2014 Primary Care Physican: Imelda Pillow, NP Primary Cardiologist: Marlou Porch Electrophysiologist: Allred Dry Weight: 262 lbs       In the past month, have you:  1. Gained more than 2 pounds in a day or more than 5 pounds in a week? Uncertain. She does not weigh at home, but went to Urgent Care about the middle of January with a bad cough. At that time her weight was 267 lbs. She felt like she may have gained a few pounds prior to that. She feels that her weights have improved.   2. Had changes in your medications (with verification of current medications)? Yes. The patient has finished a course of antibiotics for a cough.  3. Had more shortness of breath than is usual for you? no  4. Limited your activity because of shortness of breath? no  5. Not been able to sleep because of shortness of breath? no  6. Had increased swelling in your feet or ankles? no  7. Had symptoms of dehydration (dizziness, dry mouth, increased thirst, decreased urine output) no  8. Had changes in sodium restriction? no  9. Been compliant with medication? Yes   ICM trend:   Follow-up plan: ICM clinic phone appointment : 03/08/14. Corvue readings elevated from ~ 1/8-1/16. This is most likely correlating with her respiratory issues she had earlier this month. No changes made today. The patient is pending an appointment with Dr. Roxy Manns for discussion of possible epicardial lead placement. I advised the patient I would follow up and see what the plan is after 02/08/14 and will change the date of next transmission if needed. She voices understanding.   Copy of note sent to patient's primary care physician, primary cardiologist, and device following physician.  Alvis Lemmings, RN, BSN 02/02/2014 3:25 PM

## 2014-02-08 ENCOUNTER — Institutional Professional Consult (permissible substitution) (INDEPENDENT_AMBULATORY_CARE_PROVIDER_SITE_OTHER): Payer: Medicaid Other | Admitting: Thoracic Surgery (Cardiothoracic Vascular Surgery)

## 2014-02-08 ENCOUNTER — Encounter: Payer: Self-pay | Admitting: Thoracic Surgery (Cardiothoracic Vascular Surgery)

## 2014-02-08 VITALS — BP 97/58 | HR 67 | Resp 20 | Ht 63.0 in | Wt 267.0 lb

## 2014-02-08 DIAGNOSIS — I519 Heart disease, unspecified: Secondary | ICD-10-CM

## 2014-02-08 DIAGNOSIS — I209 Angina pectoris, unspecified: Secondary | ICD-10-CM

## 2014-02-08 DIAGNOSIS — I5042 Chronic combined systolic (congestive) and diastolic (congestive) heart failure: Secondary | ICD-10-CM

## 2014-02-08 DIAGNOSIS — Z951 Presence of aortocoronary bypass graft: Secondary | ICD-10-CM

## 2014-02-08 NOTE — Progress Notes (Addendum)
[address]       [place] [address]             [phone]     CARDIOTHORACIC SURGERY CONSULTATION REPORT  Referring Provider is Thompson Grayer, MD  Primary Cardiologist is Candee Furbish, MD PCP is Imelda Pillow, NP  Chief Complaint  Patient presents with  . Shortness of Breath    surgical eval for possible LV Epicardial Lead Placement    HPI:  Patient is a 64 year old morbidly obese African-American female with long-standing history of coronary artery disease status post coronary artery bypass grafting 5 by Dr. Cyndia Bent in 2007, ischemic cardiomyopathy with chronic combined systolic and diastolic congestive heart failure, chronic LBBB, hypertension, type 2 diabetes mellitus, and hyperlipidemia who has been referred for possible left ventricular epicardial pacemaker lead placement to facilitate conversion of her pacing defibrillator to biventricular pacing for cardiac resynchronization therapy. The patient's cardiac history dates back to 2007 when she presented with an acute non-ST segment elevation myocardial infarction complicated by class IV congestive heart failure. She was found have severe three-vessel coronary artery disease with severe left ventricular systolic dysfunction. She underwent coronary artery bypass grafting 5 by Dr. Cyndia Bent. Her postoperative recovery was relatively uncomplicated, but left ventricular systolic function did not improve significantly on late follow-up. She ultimately underwent placement of a defibrillator for primary prevention. Her defibrillator has never fired.  She has had chronic symptoms of congestive heart failure ever since.  For the last several years she has been followed by both Dr. Marlou Porch and Dr. Rayann Heman.  Her most recent cardiac catheterization was performed in December 2011 at which time all of her bypass grafts remained widely patent. Stress test performed in 2013 reportedly demonstrated large inferior wall defect  consistent with remote myocardial infarction. Ejection fraction was calculated 31%. Most recent transthoracic echocardiogram was performed 09/09/2012. Left ventricular ejection fraction was estimated 25-30% at that time. There was remarkable of akinesis of the anteroseptal myocardium. No other significant abnormalities were noted.  She subsequently underwent attempted biventricular ICD upgrade to facilitate cardiac resynchronization therapy by Dr. Rayann Heman on 09/25/2012. However, attempts to place a left ventricular lead through the coronary sinus were unsuccessful.  The patient has now been referred to consider surgical placement of left ventricular epicardial pacemaker lead.  The patient is married but separated from her husband. She lives locally in [place] with her adult son. She has been morbidly obese for most of her adult life. Her physical activity is limited primarily because of dyspnea. She describes chronic symptoms of exertional shortness of breath with mild activity. Symptoms may have been slowly getting worse over the last few years. For the most part she denies symptoms resting shortness of breath, although she states that occasionally she wakes up at night short of breath and has to sit up. She has chronic bilateral lower extremity edema that is reported to be mild. She takes a diuretic on a daily basis but does not monitor her weight or adjust her dose. The patient also experiences intermittent episodes of substernal chest tightness radiating across her chest that are consistent with angina pectoris. Symptoms for the most part occur with activity but occasionally occur at rest. Symptoms are usually transient and resolved within a few minutes. The patient denies any prolonged episodes of substernal chest tightness or chest pressure.  Past Medical History  Diagnosis Date  . Coronary artery disease     s/p CABG 2007  . Sleep apnea  uses O2 at night  . Ischemic cardiomyopathy     EF  30-35%, s/p ICD 4/08 by Dr Leonia Reeves  . Diabetes mellitus     type II  . Hypertension   . Obesity   . CHF (congestive heart failure)   . Oxygen dependent 2 L  . Morbid obesity 11/06/2012  . Angina decubitus 11/06/2012  . 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 Chronic combined systolic and diastolic CHF, NYHA class 3   . Angina pectoris     Past Surgical History  Procedure Laterality Date  . Cardiac catheterization    . Cardiac defibrillator placement  4/08    by Dr Leonia Reeves ([facility])  . Coronary artery bypass graft  10/05/2005    by Dr Cyndia Bent  . Implantable cardioverter defibrillator implant  09/25/12    attempt of upgrade to CRT-D unsuccessful due to CS anatomy, SJM Unify Asaura device placed with LV port capped by Dr Rayann Heman  . Bi-ventricular implantable cardioverter defibrillator upgrade N/A 09/25/2012    Procedure: BI-VENTRICULAR IMPLANTABLE CARDIOVERTER DEFIBRILLATOR UPGRADE;  Surgeon: Coralyn Mark, MD;  Location: [facility] CATH LAB;  Service: Cardiovascular;  Laterality: N/A;    Family History  Problem Relation Age of Onset  . Diabetes Mother 71    died - HTN  . Other      No early family hx of CAD    History   Social History  . Marital Status: Married    Spouse Name: N/A    Number of Children: 3  . Years of Education: N/A   Occupational History  . HAIRDRESSER    Social History Main Topics  . Smoking status: Never Smoker   . Smokeless tobacco: Not on file  . Alcohol Use: No  . Drug Use: No  . Sexual Activity: Not on file   Other Topics Concern  . Not on file   Social History Narrative   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    Current Outpatient Prescriptions  Medication Sig Dispense Refill  . albuterol (PROVENTIL HFA;VENTOLIN HFA) 108 (90 BASE) MCG/ACT inhaler Inhale 2 puffs into the lungs every 6 (six) hours as needed for wheezing or shortness  of breath.    Marland Kitchen aspirin EC 81 MG EC tablet Take 1 tablet (81 mg total) by mouth daily. 30 tablet 12  . Atorvastatin Calcium (LIPITOR PO) Take 80 mg by mouth daily.     . carvedilol (COREG) 25 MG tablet Take 25 mg by mouth 2 (two) times daily with a meal.    . furosemide (LASIX) 80 MG tablet Take 80 mg by mouth daily.    . insulin lispro (HUMALOG) 100 UNIT/ML injection Inject 85-100 Units into the skin 3 (three) times daily before meals. Use 100 mg in the morning and 85 units with lunch and supper    . Iron-Vitamin C (IRON 100/C PO) Take by mouth.    . isosorbide dinitrate (ISORDIL) 5 MG tablet Take 5 mg by mouth daily.     Marland Kitchen lisinopril (PRINIVIL,ZESTRIL) 10 MG tablet Take 1 tablet (10 mg total) by mouth daily. 30 tablet 12  . spironolactone (ALDACTONE) 25 MG tablet Take 25 mg by mouth daily.     No current facility-administered medications for this visit.    Allergies  Allergen Reactions  . Metformin And Related Itching, Swelling and Other (See Comments)  Leg pain & swelling in legs      Review of Systems:   General:  normal appetite, decreased energy, + weight gain, no weight loss, no fever  Cardiac:  + chest pain with exertion, + chest pain at rest, + SOB with exertion, occasional resting SOB, + PND, + orthopnea, no palpitations, no arrhythmia, no atrial fibrillation, + LE edema, no dizzy spells, no syncope  Respiratory:  + shortness of breath, no home oxygen, no productive cough, no dry cough, no bronchitis, no wheezing, no hemoptysis, no asthma, no pain with inspiration or cough, + sleep apnea, no CPAP at night  GI:   no difficulty swallowing, no reflux, no frequent heartburn, + hiatal hernia, no abdominal pain, no constipation, no diarrhea, no hematochezia, no hematemesis, no melena  GU:   no dysuria,  + frequency, no urinary tract infection, no hematuria, no kidney stones, no kidney disease  Vascular:  + pain suggestive of claudication, + pain in feet, no leg cramps, no varicose  veins, no DVT, no non-healing foot ulcer  Neuro:   no stroke, no TIA's, no seizures, no headaches, no temporary blindness one eye,  no slurred speech, no peripheral neuropathy, no chronic pain, no instability of gait, no memory/cognitive dysfunction  Musculoskeletal: no arthritis, no joint swelling, no myalgias, mild difficulty walking, decreased mobility   Skin:   + rash, no itching, no skin infections, no pressure sores or ulcerations  Psych:   + anxiety, no depression, no nervousness, no unusual recent stress  Eyes:   + blurry vision, no floaters, no recent vision changes, + wears glasses or contacts  ENT:   no hearing loss, no loose or painful teeth, + dentures, last saw dentist several years ago  Hematologic:  no easy bruising, no abnormal bleeding, no clotting disorder, no frequent epistaxis  Endocrine:  + diabetes, does not check CBG's at home     Physical Exam:   BP 97/58 mmHg  Pulse 67  Resp 20  Ht 5\' 3"  (1.6 m)  Wt 267 lb (121.11 kg)  BMI 47.31 kg/m2  SpO2 98%  General:  Morbidly obese,  well-appearing  HEENT:  Unremarkable   Neck:   no JVD, no bruits, no adenopathy   Chest:   clear to auscultation, symmetrical breath sounds, no wheezes, no rhonchi, extremely large breasts  CV:   RRR, no murmur   Abdomen:  Extremely obese, soft, non-tender, no masses   Extremities:  warm, well-perfused, pulses not palpable, + mild bilateral LE edema  Rectal/GU  Deferred  Neuro:   Grossly non-focal and symmetrical throughout  Skin:   Clean and dry, no rashes, no breakdown   Diagnostic Tests:  Transthoracic Echocardiography  Patient:  [name], [name] MR #:    [id number] Study Date: 09/09/2012 Gender:   F Age:    46 Height:   162.6cm Weight:   73.5kg BSA:    1.73m^2 Pt. Status: Room:    [place]  [facility] ORDERING   Maryruth Eve SONOGRAPHER Donata Clay cc:  ------------------------------------------------------------ LV EF: 25% -  30%  ------------------------------------------------------------ History:  PMH: Acute Heart Failure  ------------------------------------------------------------ Study Conclusions  - Left ventricle: The cavity size was mildly dilated. Systolic function was severely reduced. The estimated ejection fraction was in the range of 25% to 30%. There is akinesis of the anteroseptal myocardium. Doppler parameters are consistent with abnormal left ventricular relaxation (grade 1 diastolic dysfunction). - Right ventricle: The cavity size was mildly dilated. Wall thickness was  normal. Impressions:  - Compared to the prior study, there has been no significant interval change. Transthoracic echocardiography. M-mode, complete 2D, spectral Doppler, and color Doppler. Height: Height: 162.6cm. Height: 64in. Weight: Weight: 73.5kg. Weight: 161.7lb. Body mass index: BMI: 27.8kg/m^2. Body surface area:  BSA: 1.53m^2. Patient status: Inpatient.  ------------------------------------------------------------  ------------------------------------------------------------ Left ventricle: The cavity size was mildly dilated. Systolic function was severely reduced. The estimated ejection fraction was in the range of 25% to 30%. Regional wall motion abnormalities:  There is akinesis of the anteroseptal myocardium. Doppler parameters are consistent with abnormal left ventricular relaxation (grade 1 diastolic dysfunction).  ------------------------------------------------------------ Aortic valve: Poorly visualized. Mildly calcified leaflets. Cusp separation was normal. Doppler: Transvalvular velocity was within the normal range. There was no stenosis. No regurgitation.  ------------------------------------------------------------ Aorta: The aorta was normal, not dilated, and  non-diseased.  ------------------------------------------------------------ Mitral valve:  Structurally normal valve.  Leaflet separation was normal. Doppler: Transvalvular velocity was within the normal range. There was no evidence for stenosis. Trivial regurgitation.  Peak gradient: 27mm Hg (D).  ------------------------------------------------------------ Right ventricle: The cavity size was mildly dilated. Wall thickness was normal. Pacer wire or catheter noted in right ventricle. Systolic function was normal.  ------------------------------------------------------------ Pulmonic valve:  Structurally normal valve.  Cusp separation was normal. Doppler: Transvalvular velocity was within the normal range. No regurgitation.  ------------------------------------------------------------ Tricuspid valve:  Structurally normal valve.  Leaflet separation was normal. Doppler: Transvalvular velocity was within the normal range. Trivial regurgitation.  ------------------------------------------------------------ Pulmonary artery:  The main pulmonary artery was normal-sized.  ------------------------------------------------------------ Right atrium: The atrium was normal in size.  ------------------------------------------------------------ Pericardium: The pericardium was normal in appearance. There was no pericardial effusion.  ------------------------------------------------------------ Systemic veins: Inferior vena cava: The vessel was normal in size; the respirophasic diameter changes were in the normal range (= 50%); findings are consistent with normal central venous pressure.  ------------------------------------------------------------ Post procedure conclusions Ascending Aorta:  - The aorta was normal, not dilated, and non-diseased.  ------------------------------------------------------------  2D measurements    Normal Doppler measurements   Normal Left ventricle         Mitral valve LVID ED,  60.3 mm   43-52  Peak E vel  93. cm/s ------ chord,                     3 PLAX              Peak A vel   79 cm/s ------ LVID ES,  44.1 mm   23-38  Deceleration 246 ms  150-23 chord,             time          0 PLAX              Peak      3 mm  ------ FS, chord,  27 %   >29   gradient, D    Hg PLAX              Peak E/A   1.2   ------ LVPW, ED  10.8 mm   ------ ratio IVS/LVPW  1.02    <1.3 ratio, ED Ventricular septum IVS, ED   11 mm   ------ Aorta Root diam,  33 mm   ------ ED Left atrium AP dim    41 mm   ------ AP dim   2.29 cm/m^2 <2.2 index  ------------------------------------------------------------ Prepared and Electronically Authenticated by  Candee Furbish 2014-09-02T12:34:55.750   CHEST 2 VIEW  COMPARISON: 09/26/2012  FINDINGS:  Defibrillator is again noted. Cardiomegaly is again seen. Postoperative changes are stable. Linear scarring is noted in the right mid lung stable from the previous exam. No focal infiltrate or sizable effusion is seen. Increased peribronchial markings are noted which may be related to bronchitis. No other focal abnormality is seen.  IMPRESSION: Increased central peribronchial markings.   Electronically Signed  By: Inez Catalina M.D.  On: 11/26/2013 01:57   Impression:  I have personally reviewed the patient's most recent transthoracic echocardiogram, diagnostic cardiac catheterization, and chest radiograph.  The patient has severe three-vessel coronary artery disease with ischemic cardiomyopathy and severe left ventricular dysfunction. She underwent coronary artery bypass grafting 5 by Dr. Cyndia Bent in 2007 including LIMA to D2, SVG to ramus intermediate, sequential SVG to OM1-OM2 and SVG to RCA.  Her most recent diagnostic  cardiac catheterization was performed in 2011, at which time she was noted to have very diffuse distal vessel disease but all of her bypass grafts remained widely patent.  She has chronic combined systolic and diastolic congestive heart failure, New York Heart Association functional class III. She also reports symptoms consistent with angina pectoris with mild activity and occasionally at rest. She has chronic left bundle branch block but has maintained sinus rhythm. Under the circumstances she might experience some degree of symptomatic improvement with cardiac resynchronization therapy.  However, a previous attempt at left ventricular lead placement through the coronary sinus was unsuccessful.  Unfortunately, risks associated with redo left thoracotomy for left ventricular epicardial lead placement would be very high because of the patient's underlying cardiac disease, previous cardiac surgery with patent grafts (including LIMA and 3 saphenous grafts to the LCx territory), and extreme morbid obesity. I do not feel that the potential benefits of cardiac resynchronization therapy justify the risks associated with surgery in this particular case.   Plan:  I discussed matters at length with the patient and her daughter in the office today. If anything were to be attempted I would favor a second attempt at transvenous placement of left ventricular lead placement through the coronary sinus. I cannot recommend surgery at this time. All of their questions have been addressed. In the future they will call and return to see [name] as needed.   I spent in excess of 45 minutes during the conduct of this office consultation and >50% of this time involved direct face-to-face encounter with the patient for counseling and/or coordination of their care.   Valentina Gu. Roxy Manns, MD 02/08/2014 10:31 AM

## 2014-02-12 ENCOUNTER — Telehealth: Payer: Self-pay | Admitting: Internal Medicine

## 2014-02-12 NOTE — Telephone Encounter (Signed)
02-12-14 called pt to set up consult to discuss lead replacement, pt doesn't want to make this appt at this time/mt

## 2014-02-23 ENCOUNTER — Other Ambulatory Visit: Payer: Self-pay

## 2014-02-23 DIAGNOSIS — Z1231 Encounter for screening mammogram for malignant neoplasm of breast: Secondary | ICD-10-CM

## 2014-03-08 ENCOUNTER — Ambulatory Visit (INDEPENDENT_AMBULATORY_CARE_PROVIDER_SITE_OTHER): Payer: Medicaid Other | Admitting: *Deleted

## 2014-03-08 ENCOUNTER — Encounter: Payer: Self-pay | Admitting: *Deleted

## 2014-03-08 DIAGNOSIS — Z95810 Presence of automatic (implantable) cardiac defibrillator: Secondary | ICD-10-CM

## 2014-03-08 DIAGNOSIS — I5042 Chronic combined systolic (congestive) and diastolic (congestive) heart failure: Secondary | ICD-10-CM

## 2014-03-08 NOTE — Progress Notes (Signed)
EPIC Encounter for ICM Monitoring  Patient Name: [name] is a 64 y.o. female Date: 03/08/2014 Primary Care Physican: Imelda Pillow, NP Primary Cardiologist: Marlou Porch Electrophysiologist: Allred Dry Weight: 262 lbs       In the past month, have you:  1. Gained more than 2 pounds in a day or more than 5 pounds in a week? Uncertain. She does not have a scale at home, but the last time she weighed, she was 264 lbs.  2. Had changes in your medications (with verification of current medications)? no  3. Had more shortness of breath than is usual for you? no  4. Limited your activity because of shortness of breath? no  5. Not been able to sleep because of shortness of breath? no  6. Had increased swelling in your feet or ankles? Minimal.  7. Had symptoms of dehydration (dizziness, dry mouth, increased thirst, decreased urine output) no  8. Had changes in sodium restriction? no  9. Been compliant with medication? Yes   ICM trend:   Follow-up plan: ICM clinic phone appointment: 04/08/14.  Corvue readings were elevated from ~1/27-2/3. The patient states she is doing well today. No change in her breathing. She did have a consult with Dr. Roxy Manns on 02/08/14 to discuss possible epicardial lead placement. She was not felt to be a candidate for this at this time. No changes made today.   Copy of note sent to patient's primary care physician, primary cardiologist, and device following physician.  Alvis Lemmings, RN, BSN 03/08/2014 12:34 PM

## 2014-03-12 ENCOUNTER — Ambulatory Visit (INDEPENDENT_AMBULATORY_CARE_PROVIDER_SITE_OTHER): Payer: Medicaid Other | Admitting: Cardiology

## 2014-03-12 ENCOUNTER — Encounter: Payer: Self-pay | Admitting: Cardiology

## 2014-03-12 VITALS — BP 114/78 | HR 76 | Ht 63.0 in | Wt 268.0 lb

## 2014-03-12 DIAGNOSIS — Z79899 Other long term (current) drug therapy: Secondary | ICD-10-CM

## 2014-03-12 DIAGNOSIS — Z951 Presence of aortocoronary bypass graft: Secondary | ICD-10-CM

## 2014-03-12 DIAGNOSIS — I255 Ischemic cardiomyopathy: Secondary | ICD-10-CM

## 2014-03-12 DIAGNOSIS — R609 Edema, unspecified: Secondary | ICD-10-CM

## 2014-03-12 DIAGNOSIS — I447 Left bundle-branch block, unspecified: Secondary | ICD-10-CM

## 2014-03-12 DIAGNOSIS — I5042 Chronic combined systolic (congestive) and diastolic (congestive) heart failure: Secondary | ICD-10-CM

## 2014-03-12 DIAGNOSIS — I519 Heart disease, unspecified: Secondary | ICD-10-CM

## 2014-03-12 DIAGNOSIS — I10 Essential (primary) hypertension: Secondary | ICD-10-CM

## 2014-03-12 LAB — BASIC METABOLIC PANEL
BUN: 15 mg/dL (ref 6–23)
CO2: 32 mEq/L (ref 19–32)
Calcium: 9.6 mg/dL (ref 8.4–10.5)
Chloride: 101 mEq/L (ref 96–112)
Creatinine, Ser: 1.03 mg/dL (ref 0.40–1.20)
GFR: 69.38 mL/min (ref >60.00)
GLUCOSE: 172 mg/dL — AB (ref 70–99)
POTASSIUM: 4.6 meq/L (ref 3.5–5.1)
Sodium: 137 mEq/L (ref 135–145)

## 2014-03-12 NOTE — Progress Notes (Signed)
[place]. [address], [address], [place]  [address] Phone: [phone] Fax:  [phone]  Date:  03/12/2014   ID:  [name], DOB 1950/08/18, MRN [id number]  PCP:  Imelda Pillow, NP   History of Present Illness: [name] is a 64 y.o. female with cardiomyopathy EF 25-30%, ICD, CABG 2007, cardiac catheterization December of 2011 with widely patent grafts, stress test 2013 large defect prior infarct pattern, EF 31%.  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 She also saw Dr. Roxy Manns, not a candidate for epicardial lead.  Slightly increased fluid, increase Lasix. Weight is up slightly. No change in shortness of breath. No chest pain.  Wt Readings from Last 3 Encounters:  03/12/14 268 lb (121.564 kg)  02/08/14 267 lb (121.11 kg)  12/30/13 262 lb 12.8 oz (119.205 kg)     Past Medical History  Diagnosis Date  . Coronary artery disease     s/p CABG 2007  . Sleep apnea     uses O2 at night  . Ischemic cardiomyopathy     EF 30-35%, s/p ICD 4/08 by Dr Leonia Reeves  . Diabetes mellitus     type II  . Hypertension   . Obesity   . CHF (congestive heart failure)   . Oxygen dependent 2 L  . Morbid obesity 11/06/2012  . Angina decubitus 11/06/2012  . 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 Chronic combined systolic and diastolic CHF, NYHA class 3   . Angina pectoris     Past Surgical History  Procedure Laterality Date  . Cardiac catheterization    . Cardiac defibrillator placement  4/08    by Dr Leonia Reeves (MDT)  . Coronary artery bypass graft  10/05/2005    by Dr Cyndia Bent  . Implantable cardioverter defibrillator implant  09/25/12    attempt of upgrade to CRT-D unsuccessful due to CS anatomy, SJM Unify Asaura device placed with LV port capped by Dr Rayann Heman  . Bi-ventricular implantable cardioverter defibrillator upgrade N/A 09/25/2012    Procedure: BI-VENTRICULAR  IMPLANTABLE CARDIOVERTER DEFIBRILLATOR UPGRADE;  Surgeon: Coralyn , MD;  Location: [facility] CATH LAB;  Service: Cardiovascular;  Laterality: N/A;    Current Outpatient Prescriptions  Medication Sig Dispense Refill  . albuterol (PROVENTIL HFA;VENTOLIN HFA) 108 (90 BASE) MCG/ACT inhaler Inhale 2 puffs into the lungs every 6 (six) hours as needed for wheezing or shortness of breath.    Marland Kitchen aspirin EC 81 MG EC tablet Take 1 tablet (81 mg total) by mouth daily. 30 tablet 12  . Atorvastatin Calcium (LIPITOR PO) Take 80 mg by mouth daily.     . carvedilol (COREG) 25 MG tablet Take 25 mg by mouth 2 (two) times daily with a meal.    . furosemide (LASIX) 80 MG tablet Take 80 mg by mouth daily.    . insulin lispro (HUMALOG) 100 UNIT/ML injection Inject 85-100 Units into the skin 3 (three) times daily before meals. Use 100 mg in the morning and 85 units with lunch and supper    . Iron-Vitamin C (IRON 100/C PO) Take by mouth.    . isosorbide dinitrate (ISORDIL) 5 MG tablet Take 5 mg by mouth daily.     Marland Kitchen latanoprost (XALATAN) 0.005 % ophthalmic solution Place 1 drop into both eyes at bedtime.    Marland Kitchen lisinopril (PRINIVIL,ZESTRIL) 10 MG  tablet Take 1 tablet (10 mg total) by mouth daily. 30 tablet 12  . potassium phosphate, monobasic, (K-PHOS ORIGINAL) 500 MG tablet Take 500 mg by mouth 4 (four) times daily -  with meals and at bedtime.    Marland Kitchen spironolactone (ALDACTONE) 25 MG tablet Take 25 mg by mouth daily.     No current facility-administered medications for this visit.    Allergies:    Allergies  Allergen Reactions  . Metformin And Related Itching, Swelling and Other (See Comments)    Leg pain & swelling in legs    Social History:  The patient  reports that she has never smoked. She does not have any smokeless tobacco history on file. She reports that she does not drink alcohol or use illicit drugs.   ROS:  Please see the history of present illness.   Denies any syncope, bleeding, orthopnea,  PND  PHYSICAL EXAM: VS:  BP 114/78 mmHg  Pulse 76  Ht 5\' 3"  (1.6 m)  Wt 268 lb (121.564 kg)  BMI 47.49 kg/m2 Well nourished, well developed, in no acute distress HEENT: normal Neck: no JVD Cardiac:  normal S1, S2; RRR; no murmur Lungs:  clear to auscultation bilaterally, no wheezing, rhonchi or rales Abd: soft, nontender, no hepatomegalyObese Ext: no edema Skin: warm and dry Neuro: no focal abnormalities noted  EKG: 11/06/12: Left bundle branch block, 68, sinus rhythm, no change from prior 06/10/13-sinus rhythm, 63, left bundle branch block Cardiac angiogram-2011-widely patent grafts Nuclear stress test-2013-no ischemia, old infarct, EF 31%  ASSESSMENT AND PLAN:  1. Chronic systolic heart failure-fairly well compensated. NYHA class 2-3. Continue to advocate dietary compliance, low-salt diet, fluid restrictions. I will give her Lasix 80 mg twice a day for the next 2 days. Go back to 80 mg once a day after that. 2. Left bundle branch block-unable to place biventricular lead due to vein sclerosis. Not a candidate for epicardial lead. 3. Morbid obesity-encourage weight loss. Low carbohydrate diet.Discussed at length today. 4. Angina-chest pain with exertion has improved greatly with addition of isosorbide. Prior catheterization reassuring. Nuclear stress test reassuring. 5. Hypertension-currently well controlled check a basic metabolic profile, on spironolactone. Prior creatinine 1.3. Prior potassium 5.1. 6. Diabetes-continue to work hard with your primary physician. 7. We will see her back in four months.   Signed, Candee Furbish, MD [facility]  03/12/2014 9:40 AM

## 2014-03-12 NOTE — Patient Instructions (Addendum)
Please take Lasix (Furosemide) 80 mg twice a day for 2 days, then resume 80 mg once a day. Continue all other medications as listed.  Please have blood work today (BMP)  Follow up in 4 months with Dr. Marlou Porch.  You will receive a letter in the mail 2 months before you are due.  Please call us when you receive this letter to schedule your follow up appointment.  Thank you for choosing [facility]!!

## 2014-03-19 ENCOUNTER — Ambulatory Visit: Payer: Medicaid Other | Admitting: Cardiology

## 2014-03-25 ENCOUNTER — Ambulatory Visit
Admission: RE | Admit: 2014-03-25 | Discharge: 2014-03-25 | Disposition: A | Discharge status: Home / Self Care | Payer: Medicaid Other | Source: Ambulatory Visit

## 2014-03-25 DIAGNOSIS — Z1231 Encounter for screening mammogram for malignant neoplasm of breast: Secondary | ICD-10-CM

## 2014-03-26 ENCOUNTER — Other Ambulatory Visit: Payer: Self-pay | Admitting: *Deleted

## 2014-03-26 DIAGNOSIS — R928 Other abnormal and inconclusive findings on diagnostic imaging of breast: Secondary | ICD-10-CM

## 2014-03-31 ENCOUNTER — Ambulatory Visit
Admission: RE | Admit: 2014-03-31 | Discharge: 2014-03-31 | Disposition: A | Discharge status: Home / Self Care | Payer: Medicaid Other | Source: Ambulatory Visit | Attending: *Deleted | Admitting: *Deleted

## 2014-03-31 ENCOUNTER — Other Ambulatory Visit: Payer: Self-pay | Admitting: *Deleted

## 2014-03-31 DIAGNOSIS — R928 Other abnormal and inconclusive findings on diagnostic imaging of breast: Secondary | ICD-10-CM

## 2014-04-05 ENCOUNTER — Ambulatory Visit
Admission: RE | Admit: 2014-04-05 | Discharge: 2014-04-05 | Disposition: A | Discharge status: Home / Self Care | Payer: Medicaid Other | Source: Ambulatory Visit | Attending: *Deleted | Admitting: *Deleted

## 2014-04-05 ENCOUNTER — Other Ambulatory Visit: Payer: Self-pay | Admitting: *Deleted

## 2014-04-05 DIAGNOSIS — R928 Other abnormal and inconclusive findings on diagnostic imaging of breast: Secondary | ICD-10-CM

## 2014-04-05 HISTORY — PX: BREAST BIOPSY: SHX20

## 2014-04-08 ENCOUNTER — Encounter: Payer: Self-pay | Admitting: Internal Medicine

## 2014-04-08 ENCOUNTER — Ambulatory Visit (INDEPENDENT_AMBULATORY_CARE_PROVIDER_SITE_OTHER): Payer: Medicaid Other | Admitting: *Deleted

## 2014-04-08 DIAGNOSIS — I255 Ischemic cardiomyopathy: Secondary | ICD-10-CM

## 2014-04-08 DIAGNOSIS — Z95810 Presence of automatic (implantable) cardiac defibrillator: Secondary | ICD-10-CM

## 2014-04-08 DIAGNOSIS — I5042 Chronic combined systolic (congestive) and diastolic (congestive) heart failure: Secondary | ICD-10-CM | POA: Diagnosis not present

## 2014-04-08 LAB — MDC_IDC_ENUM_SESS_TYPE_REMOTE
Brady Statistic RA Percent Paced: 1 % — CL
Brady Statistic RV Percent Paced: 1 % — CL
HIGH POWER IMPEDANCE MEASURED VALUE: 38 Ohm
Implantable Pulse Generator Serial Number: 7070007
Lead Channel Impedance Value: 440 Ohm
Lead Channel Sensing Intrinsic Amplitude: 2.1 mV
MDC IDC MSMT LEADCHNL RA IMPEDANCE VALUE: 480 Ohm
MDC IDC MSMT LEADCHNL RV SENSING INTR AMPL: 12 mV — AB

## 2014-04-08 NOTE — Progress Notes (Signed)
Remote ICD transmission.   

## 2014-04-09 ENCOUNTER — Encounter: Payer: Self-pay | Admitting: *Deleted

## 2014-04-09 NOTE — Progress Notes (Signed)
EPIC Encounter for ICM Monitoring  Patient Name: [name] is a 64 y.o. female Date: 04/09/2014 Primary Care Physican: Imelda Pillow, NP Primary Cardiologist: Marlou Porch Electrophysiologist: Allred Dry Weight: 267 lbs       In the past month, have you:  1. Gained more than 2 pounds in a day or more than 5 pounds in a week? no  2. Had changes in your medications (with verification of current medications)? Yes. The patient saw Dr. Marlou Porch on 3/4 and was instructed to increase lasix to 80 mg BID x 2 days then resume her normal dosing. She did have to do this again sometime within the last week.   3. Had more shortness of breath than is usual for you? no  4. Limited your activity because of shortness of breath? no  5. Not been able to sleep because of shortness of breath? no  6. Had increased swelling in your feet or ankles? Yes. She took an extra lasix 80 mg BID x 2 days then resumed normal dosing.   7. Had symptoms of dehydration (dizziness, dry mouth, increased thirst, decreased urine output) no  8. Had changes in sodium restriction? no  9. Been compliant with medication? Yes   ICM trend:   Follow-up plan: ICM clinic phone appointment: 05/13/14. The patient's corvue readings were elevated from ~ 3/18-3/28. She did have some lower extremity swelling during this time and increased her lasix to 80 mg BID x 2 days then resumed normal dosing. No further change made today.   Copy of note sent to patient's primary care physician, primary cardiologist, and device following physician.  Alvis Lemmings, RN, BSN 04/09/2014 11:34 AM

## 2014-04-09 NOTE — Addendum Note (Signed)
Addended by: Alvis Lemmings C on: 04/09/2014 11:39 AM   Modules accepted: Level of Service

## 2014-04-15 ENCOUNTER — Encounter: Payer: Self-pay | Admitting: Cardiology

## 2014-05-11 ENCOUNTER — Telehealth: Payer: Self-pay | Admitting: *Deleted

## 2014-05-11 ENCOUNTER — Other Ambulatory Visit: Payer: Self-pay | Admitting: *Deleted

## 2014-05-11 MED ORDER — ISOSORBIDE DINITRATE 5 MG PO TABS: 5.0000 mg | ORAL_TABLET | Freq: Every day | ORAL | Status: DC

## 2014-05-11 MED ORDER — LISINOPRIL 10 MG PO TABS: 10.0000 mg | ORAL_TABLET | Freq: Every day | ORAL | Status: DC

## 2014-05-11 MED ORDER — CARVEDILOL 25 MG PO TABS: 25.0000 mg | ORAL_TABLET | Freq: Two times a day (BID) | ORAL | Status: DC

## 2014-05-11 MED ORDER — SPIRONOLACTONE 25 MG PO TABS: 25.0000 mg | ORAL_TABLET | Freq: Every day | ORAL | Status: DC

## 2014-05-11 NOTE — Telephone Encounter (Signed)
rx called into pharmacy

## 2014-05-11 NOTE — Telephone Encounter (Signed)
Patient called and requested refills on carvedilol, lisinopril, isosorbide, and spironolactone. It does not look like Dr Marlou Porch has ever filled these for the patient. Ok to refill? Please advise. Thanks, MI

## 2014-05-11 NOTE — Telephone Encounter (Signed)
Pt was just seen by Dr Marlou Porch.  OK to refill.  You may have to call them in because I have not been able to E-scribe in EPIC.

## 2014-05-12 ENCOUNTER — Other Ambulatory Visit: Payer: Self-pay | Admitting: *Deleted

## 2014-05-12 MED ORDER — FUROSEMIDE 80 MG PO TABS: 80.0000 mg | ORAL_TABLET | Freq: Every day | ORAL | Status: DC

## 2014-05-13 ENCOUNTER — Ambulatory Visit (INDEPENDENT_AMBULATORY_CARE_PROVIDER_SITE_OTHER): Payer: Medicaid Other | Admitting: *Deleted

## 2014-05-13 ENCOUNTER — Encounter: Payer: Self-pay | Admitting: *Deleted

## 2014-05-13 DIAGNOSIS — Z95810 Presence of automatic (implantable) cardiac defibrillator: Secondary | ICD-10-CM | POA: Diagnosis not present

## 2014-05-13 DIAGNOSIS — I5042 Chronic combined systolic (congestive) and diastolic (congestive) heart failure: Secondary | ICD-10-CM

## 2014-05-13 NOTE — Progress Notes (Signed)
EPIC Encounter for ICM Monitoring  Patient Name: [name] is a 64 y.o. female Date: 05/13/2014 Primary Care Physican: Imelda Pillow, NP Primary Cardiologist: Marlou Porch Electrophysiologist: Allred Dry Weight: 267 lbs       In the past month, have you:  1. Gained more than 2 pounds in a day or more than 5 pounds in a week? No. Most recent weight was 264 lbs.  2. Had changes in your medications (with verification of current medications)? no  3. Had more shortness of breath than is usual for you? No. This is stable. She does use an inhaler and this helps.  4. Limited your activity because of shortness of breath? no  5. Not been able to sleep because of shortness of breath? no  6. Had increased swelling in your feet or ankles? no  7. Had symptoms of dehydration (dizziness, dry mouth, increased thirst, decreased urine output) no  8. Had changes in sodium restriction? no  9. Been compliant with medication? Yes   ICM trend:   Follow-up plan: ICM clinic phone appointment: 06/15/14. The patient's impedence was below baseline from ~ 4/3-4/11. She has done well since that time. She has not required any extra lasix this month. No changes made today.   Copy of note sent to patient's primary care physician, primary cardiologist, and device following physician.  Alvis Lemmings, RN, BSN 05/13/2014 1:09 PM

## 2014-05-25 ENCOUNTER — Inpatient Hospital Stay (HOSPITAL_COMMUNITY)
Admission: EM | Admit: 2014-05-25 | Discharge: 2014-05-30 | DRG: 313 | Disposition: A | Discharge status: Home / Self Care | Payer: Medicaid Other | Attending: Internal Medicine | Admitting: Internal Medicine

## 2014-05-25 ENCOUNTER — Emergency Department (HOSPITAL_COMMUNITY)
Admission: EM | Admit: 2014-05-25 | Discharge: 2014-05-25 | Discharge status: Absent without leave | Payer: Medicaid Other | Source: Home / Self Care

## 2014-05-25 ENCOUNTER — Emergency Department (HOSPITAL_COMMUNITY): Payer: Medicaid Other

## 2014-05-25 ENCOUNTER — Encounter (HOSPITAL_COMMUNITY): Payer: Self-pay | Admitting: Emergency Medicine

## 2014-05-25 DIAGNOSIS — N644 Mastodynia: Secondary | ICD-10-CM | POA: Diagnosis present

## 2014-05-25 DIAGNOSIS — Z95810 Presence of automatic (implantable) cardiac defibrillator: Secondary | ICD-10-CM

## 2014-05-25 DIAGNOSIS — Z888 Allergy status to other drugs, medicaments and biological substances status: Secondary | ICD-10-CM

## 2014-05-25 DIAGNOSIS — I2510 Atherosclerotic heart disease of native coronary artery without angina pectoris: Secondary | ICD-10-CM | POA: Diagnosis present

## 2014-05-25 DIAGNOSIS — R062 Wheezing: Secondary | ICD-10-CM | POA: Diagnosis present

## 2014-05-25 DIAGNOSIS — I10 Essential (primary) hypertension: Secondary | ICD-10-CM | POA: Diagnosis present

## 2014-05-25 DIAGNOSIS — E11649 Type 2 diabetes mellitus with hypoglycemia without coma: Secondary | ICD-10-CM | POA: Diagnosis present

## 2014-05-25 DIAGNOSIS — R0602 Shortness of breath: Secondary | ICD-10-CM | POA: Insufficient documentation

## 2014-05-25 DIAGNOSIS — Z79899 Other long term (current) drug therapy: Secondary | ICD-10-CM

## 2014-05-25 DIAGNOSIS — R109 Unspecified abdominal pain: Secondary | ICD-10-CM | POA: Diagnosis present

## 2014-05-25 DIAGNOSIS — Z951 Presence of aortocoronary bypass graft: Secondary | ICD-10-CM

## 2014-05-25 DIAGNOSIS — E041 Nontoxic single thyroid nodule: Secondary | ICD-10-CM | POA: Insufficient documentation

## 2014-05-25 DIAGNOSIS — I255 Ischemic cardiomyopathy: Secondary | ICD-10-CM | POA: Diagnosis present

## 2014-05-25 DIAGNOSIS — E662 Morbid (severe) obesity with alveolar hypoventilation: Secondary | ICD-10-CM | POA: Diagnosis present

## 2014-05-25 DIAGNOSIS — N200 Calculus of kidney: Secondary | ICD-10-CM | POA: Diagnosis present

## 2014-05-25 DIAGNOSIS — Z794 Long term (current) use of insulin: Secondary | ICD-10-CM

## 2014-05-25 DIAGNOSIS — R079 Chest pain, unspecified: Principal | ICD-10-CM | POA: Diagnosis present

## 2014-05-25 DIAGNOSIS — I252 Old myocardial infarction: Secondary | ICD-10-CM

## 2014-05-25 DIAGNOSIS — J9620 Acute and chronic respiratory failure, unspecified whether with hypoxia or hypercapnia: Secondary | ICD-10-CM | POA: Diagnosis present

## 2014-05-25 DIAGNOSIS — I5042 Chronic combined systolic (congestive) and diastolic (congestive) heart failure: Secondary | ICD-10-CM | POA: Diagnosis present

## 2014-05-25 DIAGNOSIS — M549 Dorsalgia, unspecified: Secondary | ICD-10-CM

## 2014-05-25 DIAGNOSIS — R10A1 Flank pain, right side: Secondary | ICD-10-CM | POA: Diagnosis present

## 2014-05-25 DIAGNOSIS — Z7982 Long term (current) use of aspirin: Secondary | ICD-10-CM

## 2014-05-25 DIAGNOSIS — G4733 Obstructive sleep apnea (adult) (pediatric): Secondary | ICD-10-CM | POA: Diagnosis present

## 2014-05-25 DIAGNOSIS — Z6841 Body Mass Index (BMI) 40.0 and over, adult: Secondary | ICD-10-CM

## 2014-05-25 DIAGNOSIS — Z9981 Dependence on supplemental oxygen: Secondary | ICD-10-CM

## 2014-05-25 DIAGNOSIS — I447 Left bundle-branch block, unspecified: Secondary | ICD-10-CM | POA: Diagnosis present

## 2014-05-25 DIAGNOSIS — J189 Pneumonia, unspecified organism: Secondary | ICD-10-CM | POA: Insufficient documentation

## 2014-05-25 DIAGNOSIS — I25118 Atherosclerotic heart disease of native coronary artery with other forms of angina pectoris: Secondary | ICD-10-CM | POA: Diagnosis present

## 2014-05-25 DIAGNOSIS — E1165 Type 2 diabetes mellitus with hyperglycemia: Secondary | ICD-10-CM | POA: Diagnosis present

## 2014-05-25 LAB — BASIC METABOLIC PANEL
ANION GAP: 12 (ref 5–15)
BUN: 10 mg/dL (ref 6–20)
CALCIUM: 9.5 mg/dL (ref 8.9–10.3)
CO2: 28 mmol/L (ref 22–32)
Chloride: 96 mmol/L — ABNORMAL LOW (ref 101–111)
Creatinine, Ser: 0.83 mg/dL (ref 0.44–1.00)
GFR calc Af Amer: >60 mL/min (ref >60)
GFR calc non Af Amer: >60 mL/min (ref >60)
GLUCOSE: 215 mg/dL — AB (ref 65–99)
POTASSIUM: 3.7 mmol/L (ref 3.5–5.1)
Sodium: 136 mmol/L (ref 135–145)

## 2014-05-25 LAB — URINALYSIS, ROUTINE W REFLEX MICROSCOPIC
Bilirubin Urine: NEGATIVE
Glucose, UA: NEGATIVE mg/dL
Hgb urine dipstick: NEGATIVE
KETONES UR: NEGATIVE mg/dL
NITRITE: NEGATIVE
PH: 5 (ref 5.0–8.0)
Protein, ur: NEGATIVE mg/dL
Specific Gravity, Urine: 1.011 (ref 1.005–1.030)
Urobilinogen, UA: 1 mg/dL (ref 0.0–1.0)

## 2014-05-25 LAB — I-STAT TROPONIN, ED: TROPONIN I, POC: 0.01 ng/mL (ref 0.00–0.08)

## 2014-05-25 LAB — CBC
HCT: 45.4 % (ref 36.0–46.0)
HEMOGLOBIN: 15.1 g/dL — AB (ref 12.0–15.0)
MCH: 30.8 pg (ref 26.0–34.0)
MCHC: 33.3 g/dL (ref 30.0–36.0)
MCV: 92.7 fL (ref 78.0–100.0)
Platelets: 169 10*3/uL (ref 150–400)
RBC: 4.9 MIL/uL (ref 3.87–5.11)
RDW: 14.4 % (ref 11.5–15.5)
WBC: 6.7 10*3/uL (ref 4.0–10.5)

## 2014-05-25 LAB — URINE MICROSCOPIC-ADD ON

## 2014-05-25 LAB — BRAIN NATRIURETIC PEPTIDE: B Natriuretic Peptide: 362.5 pg/mL — ABNORMAL HIGH (ref 0.0–100.0)

## 2014-05-25 MED ORDER — ONDANSETRON HCL 4 MG/2ML IJ SOLN
4.0000 mg | Freq: Once | INTRAMUSCULAR | Status: AC
  Administered 2014-05-25: 4 mg via INTRAVENOUS
  Filled 2014-05-25: qty 2

## 2014-05-25 MED ORDER — MORPHINE SULFATE 4 MG/ML IJ SOLN
4.0000 mg | Freq: Once | INTRAMUSCULAR | Status: AC
Start: 2014-05-25 — End: 2014-05-25
  Administered 2014-05-25: 4 mg via INTRAVENOUS
  Filled 2014-05-25: qty 1

## 2014-05-25 NOTE — ED Provider Notes (Signed)
  Face-to-face evaluation   History: She presents from her PCP office where she was evaluated for right-sided back pain, and left-sided chest pain below her breast. The chest pain has resolved but the back pain has persisted. She has been wheezing some, and had shortness of breath with exertional dyspnea for several months. Her PCP put her on albuterol.  Physical exam: Obese, alert, cooperative. She appears comfortable. Chest nontender to palpation. Heart regular rate and rhythm, no murmur. Lungs decreased air movement bilaterally with scattered wheezes. Bilateral lower extremity edema, 1-2+ left greater than right.  Assessment - nonspecific chest pain and shortness of breath. History ischemic cardiomyopathy, and  congestive heart failure. She will need admission for serial troponins and consideration for stress test after consultation with cardiology.    Medical screening examination/treatment/procedure(s) were conducted as a shared visit with non-physician practitioner(s) and myself.  I personally evaluated the patient during the encounter  Daleen Bo, MD 05/27/14 [phone]

## 2014-05-25 NOTE — ED Provider Notes (Signed)
CSN: [id number]     Arrival date & time 05/25/14  1950 History   First MD Initiated Contact with Patient 05/25/14 2006     Chief Complaint  Patient presents with  . Shortness of Breath  . Chest Pain     (Consider location/radiation/quality/duration/timing/severity/associated sxs/prior Treatment) HPI Comments: Patient is a 64 year old female with a past medical history of CAD, diabetes, previous CABG, CHF, and obesity who presents with left chest pain that started yesterday. Symptoms started gradually and remained constant since the onset. The pain is sharp and severe and does not radiate. She reports associated SOB that is worse with exertion. No alleviating factors. Patient was sent to the ED via EMS from [facility]. She was given aspirin and nitro with minimal relief. No other associated symptoms.    Past Medical History  Diagnosis Date  . Coronary artery disease     s/p CABG 2007  . Sleep apnea     uses O2 at night  . Ischemic cardiomyopathy     EF 30-35%, s/p ICD 4/08 by Dr Leonia Reeves  . Diabetes mellitus     type II  . Hypertension   . Obesity   . CHF (congestive heart failure)   . Oxygen dependent 2 L  . Morbid obesity 11/06/2012  . Angina decubitus 11/06/2012  . 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 Chronic combined systolic and diastolic CHF, NYHA class 3   . Angina pectoris    Past Surgical History  Procedure Laterality Date  . Cardiac catheterization    . Cardiac defibrillator placement  4/08    by Dr Leonia Reeves (MDT)  . Coronary artery bypass graft  10/05/2005    by Dr Cyndia Bent  . Implantable cardioverter defibrillator implant  09/25/12    attempt of upgrade to CRT-D unsuccessful due to CS anatomy, SJM Unify Asaura device placed with LV port capped by Dr Rayann Heman  . Bi-ventricular implantable cardioverter defibrillator upgrade N/A 09/25/2012    Procedure:  BI-VENTRICULAR IMPLANTABLE CARDIOVERTER DEFIBRILLATOR UPGRADE;  Surgeon: Coralyn Mark, MD;  Location[facility] C[facility] LAB;  Service: Cardiovascular;  Laterality: N/A;   Family History  Problem Relation Age of Onset  . Diabetes Mother 73    died - HTN  . Other      No early family hx of CAD   History  Substance Use Topics  . Smoking status: Never Smoker   . Smokeless tobacco: Not on file  . Alcohol Use: No   OB History    No data available     Review of Systems  Constitutional: Negative for fever, chills and fatigue.  HENT: Negative for trouble swallowing.   Eyes: Negative for visual disturbance.  Respiratory: Negative for shortness of breath.   Cardiovascular: Positive for chest pain. Negative for palpitations.  Gastrointestinal: Negative for nausea, vomiting, abdominal pain and diarrhea.  Genitourinary: Positive for flank pain. Negative for dysuria and difficulty urinating.  Musculoskeletal: Positive for back pain. Negative for arthralgias and neck pain.  Skin: Negative for color change.  Neurological: Negative for dizziness and weakness.  Psychiatric/Behavioral: Negative for dysphoric mood.      Allergies  Metformin and related  Home Medications   Prior to Admission medications   Medication Sig Start Date End Date Taking? Authorizing Provider  albuterol (PROVENTIL HFA;VENTOLIN HFA) 108 (90 BASE) MCG/ACT inhaler Inhale 2 puffs  into the lungs every 6 (six) hours as needed for wheezing or shortness of breath.    Historical Provider, MD  aspirin EC 81 MG EC tablet Take 1 tablet (81 mg total) by mouth daily. 09/11/12   Jerline Pain, MD  Atorvastatin Calcium (LIPITOR PO) Take 80 mg by mouth daily.     Historical Provider, MD  carvedilol (COREG) 25 MG tablet Take 1 tablet (25 mg total) by mouth 2 (two) times daily with a meal. 05/11/14   Jerline Pain, MD  furosemide (LASIX) 80 MG tablet Take 1 tablet (80 mg total) by mouth daily. 05/12/14   Jerline Pain, MD  insulin lispro (HUMALOG)  100 UNIT/ML injection Inject 85-100 Units into the skin 3 (three) times daily before meals. Use 100 mg in the morning and 85 units with lunch and supper    Historical Provider, MD  Iron-Vitamin C (IRON 100/C PO) Take by mouth.    Historical Provider, MD  isosorbide dinitrate (ISORDIL) 5 MG tablet Take 1 tablet (5 mg total) by mouth daily. 05/11/14   Jerline Pain, MD  latanoprost (XALATAN) 0.005 % ophthalmic solution Place 1 drop into both eyes at bedtime.    Historical Provider, MD  lisinopril (PRINIVIL,ZESTRIL) 10 MG tablet Take 1 tablet (10 mg total) by mouth daily. 05/11/14   Jerline Pain, MD  potassium phosphate, monobasic, (K-PHOS ORIGINAL) 500 MG tablet Take 500 mg by mouth 4 (four) times daily -  with meals and at bedtime.    Historical Provider, MD  spironolactone (ALDACTONE) 25 MG tablet Take 1 tablet (25 mg total) by mouth daily. 05/11/14   Jerline Pain, MD   BP 180/88 mmHg  Pulse 78  Temp(Src) 98.5 F (36.9 C)  Resp 22  Ht 5\' 3"  (1.6 m)  Wt 265 lb (120.203 kg)  BMI 46.95 kg/m2  SpO2 97% Physical Exam  Constitutional: She is oriented to person, place, and time. She appears well-developed and well-nourished. No distress.  obese  HENT:  Head: Normocephalic and atraumatic.  Eyes: Conjunctivae and EOM are normal.  Neck: Normal range of motion.  Cardiovascular: Normal rate and regular rhythm.  Exam reveals no gallop and no friction rub.   No murmur heard. Pulmonary/Chest: Effort normal and breath sounds normal. She has no wheezes. She has no rales. She exhibits no tenderness.  Abdominal: Soft. She exhibits no distension. There is no tenderness. There is no rebound.  Musculoskeletal: Normal range of motion.  Neurological: She is alert and oriented to person, place, and time. Coordination normal.  Speech is goal-oriented. Moves limbs without ataxia.   Skin: Skin is warm and dry.  Psychiatric: She has a normal mood and affect. Her behavior is normal.  Nursing note and vitals  reviewed.   ED Course  Procedures (including critical care time) Labs Review Labs Reviewed  CBC - Abnormal; Notable for the following:    Hemoglobin 15.1 (*)    All other components within normal limits  BASIC METABOLIC PANEL - Abnormal; Notable for the following:    Chloride 96 (*)    Glucose, Bld 215 (*)    All other components within normal limits  BRAIN NATRIURETIC PEPTIDE - Abnormal; Notable for the following:    B Natriuretic Peptide 362.5 (*)    All other components within normal limits  URINALYSIS, ROUTINE W REFLEX MICROSCOPIC - Abnormal; Notable for the following:    APPearance CLOUDY (*)    Leukocytes, UA TRACE (*)    All other components  within normal limits  URINE MICROSCOPIC-ADD ON - Abnormal; Notable for the following:    Squamous Epithelial / LPF FEW (*)    Bacteria, UA FEW (*)    Casts HYALINE CASTS (*)    All other components within normal limits  Randolm Idol, ED    Imaging Review Dg Chest 2 View  05/25/2014   ADDENDUM REPORT: 05/25/2014 23:19  ADDENDUM: This addendum is for clarification of the impression.  1. Low lung volumes with increased linear opacities at the right lung base, may reflect atelectasis and hypoventilatory change. Minimal pneumonia could have a similar appearance. The linear opacity in the left mid lung zone has the appearance atelectasis or scarring. 2. Grossly stable cardiomegaly.   Electronically Signed   By: Jeb Levering M.D.   On: 05/25/2014 23:19   05/25/2014   CLINICAL DATA:  Shortness of breath, posterior right and left lower chest pain. Symptoms for 1 week.  EXAM: CHEST  2 VIEW  COMPARISON:  11/26/2013  FINDINGS: Patient is post median sternotomy. Dual lead left-sided pacemaker remains in place. Lower lung volumes from prior exam leading to accentuation of cardiac size and crowding of bronchovascular structures. Cardiomegaly is grossly stable allowing for differences in technique. There are increasing linear opacities at the right  lung base. Linear opacity in the left midlung zone, atelectasis or scarring. Mild central vascular congestion without gross pulmonary edema. No pleural effusion or pneumothorax. Degenerative change in the spine without acute osseous abnormality.  IMPRESSION: 1. Low lung volumes with increased linear opacities at the right lung base, may reflect atelectasis and hypoventilatory change. Minimal pneumonia could have a similar appearance. 2. Grossly stable cardiomegaly.  Mild vascular congestion.  Electronically Signed: By: Jeb Levering M.D. On: 05/25/2014 22:59     EKG Interpretation   Date/Time:  Tuesday May 25 2014 20:03:11 EDT Ventricular Rate:  76 PR Interval:  181 QRS Duration: 165 QT Interval:  470 QTC Calculation: 528 R Axis:   119 Text Interpretation:  Sinus rhythm IVCD, consider atypical LBBB since last  tracing no significant change Confirmed by Eulis Foster  MD, ELLIOTT [id number]) on  05/25/2014 8:14:55 PM      MDM   Final diagnoses:  SOB (shortness of breath)  Chest pain  CAP (community acquired pneumonia)    9:07 PM Labs and chest xray pending. Vitals stable and patient afebrile.   11:45 PM Labs unremarkable for acute changes. Patient's chest xray shows atelectasis vs pneumonia. Patient has no elevated WBC or fever. I'm concerned about a possible PE. She is not tachycardic but takes a beta blocker. Patient wears supplemental oxygen at home.   Alvina Chou, PA-C 05/26/14 [place], MD 05/27/14 [phone]

## 2014-05-25 NOTE — ED Notes (Signed)
Per EMS: pt from [facility] for eval of right sided back pain and left sided breast pain x1 day. Pt also reports increased sob with exertion and movement. Pt given 324 of aspirin and 0.4 nitro with minimal relief. EKG unremarkable and LBBB noted. Pt has pursed lip breathing upon arrival which is pt baseline, pt arrives on 2L oxygen due to sob, ems noted pt 94% on RA. Pt in nad at this time.

## 2014-05-25 NOTE — ED Notes (Signed)
Patient transported to X-ray 

## 2014-05-26 ENCOUNTER — Encounter (HOSPITAL_COMMUNITY): Payer: Self-pay | Admitting: Internal Medicine

## 2014-05-26 ENCOUNTER — Other Ambulatory Visit (HOSPITAL_COMMUNITY): Payer: Self-pay

## 2014-05-26 ENCOUNTER — Inpatient Hospital Stay (HOSPITAL_COMMUNITY): Payer: Medicaid Other

## 2014-05-26 ENCOUNTER — Emergency Department (HOSPITAL_COMMUNITY): Payer: Medicaid Other

## 2014-05-26 DIAGNOSIS — E1165 Type 2 diabetes mellitus with hyperglycemia: Secondary | ICD-10-CM | POA: Diagnosis present

## 2014-05-26 DIAGNOSIS — Z794 Long term (current) use of insulin: Secondary | ICD-10-CM | POA: Diagnosis not present

## 2014-05-26 DIAGNOSIS — I255 Ischemic cardiomyopathy: Secondary | ICD-10-CM

## 2014-05-26 DIAGNOSIS — R109 Unspecified abdominal pain: Secondary | ICD-10-CM | POA: Diagnosis not present

## 2014-05-26 DIAGNOSIS — I2510 Atherosclerotic heart disease of native coronary artery without angina pectoris: Secondary | ICD-10-CM | POA: Diagnosis present

## 2014-05-26 DIAGNOSIS — I25118 Atherosclerotic heart disease of native coronary artery with other forms of angina pectoris: Secondary | ICD-10-CM | POA: Diagnosis present

## 2014-05-26 DIAGNOSIS — I5042 Chronic combined systolic (congestive) and diastolic (congestive) heart failure: Secondary | ICD-10-CM | POA: Diagnosis present

## 2014-05-26 DIAGNOSIS — G4733 Obstructive sleep apnea (adult) (pediatric): Secondary | ICD-10-CM | POA: Diagnosis present

## 2014-05-26 DIAGNOSIS — E662 Morbid (severe) obesity with alveolar hypoventilation: Secondary | ICD-10-CM | POA: Diagnosis present

## 2014-05-26 DIAGNOSIS — E118 Type 2 diabetes mellitus with unspecified complications: Secondary | ICD-10-CM | POA: Diagnosis not present

## 2014-05-26 DIAGNOSIS — I252 Old myocardial infarction: Secondary | ICD-10-CM | POA: Diagnosis not present

## 2014-05-26 DIAGNOSIS — R079 Chest pain, unspecified: Secondary | ICD-10-CM | POA: Diagnosis present

## 2014-05-26 DIAGNOSIS — I10 Essential (primary) hypertension: Secondary | ICD-10-CM | POA: Diagnosis not present

## 2014-05-26 DIAGNOSIS — N644 Mastodynia: Secondary | ICD-10-CM | POA: Diagnosis present

## 2014-05-26 DIAGNOSIS — Z7982 Long term (current) use of aspirin: Secondary | ICD-10-CM | POA: Diagnosis not present

## 2014-05-26 DIAGNOSIS — Z6841 Body Mass Index (BMI) 40.0 and over, adult: Secondary | ICD-10-CM | POA: Diagnosis not present

## 2014-05-26 DIAGNOSIS — R062 Wheezing: Secondary | ICD-10-CM | POA: Diagnosis present

## 2014-05-26 DIAGNOSIS — Z95810 Presence of automatic (implantable) cardiac defibrillator: Secondary | ICD-10-CM | POA: Diagnosis not present

## 2014-05-26 DIAGNOSIS — N200 Calculus of kidney: Secondary | ICD-10-CM | POA: Diagnosis present

## 2014-05-26 DIAGNOSIS — E041 Nontoxic single thyroid nodule: Secondary | ICD-10-CM | POA: Diagnosis present

## 2014-05-26 DIAGNOSIS — Z888 Allergy status to other drugs, medicaments and biological substances status: Secondary | ICD-10-CM | POA: Diagnosis not present

## 2014-05-26 DIAGNOSIS — E11649 Type 2 diabetes mellitus with hypoglycemia without coma: Secondary | ICD-10-CM | POA: Diagnosis present

## 2014-05-26 DIAGNOSIS — J189 Pneumonia, unspecified organism: Secondary | ICD-10-CM | POA: Diagnosis not present

## 2014-05-26 DIAGNOSIS — R0782 Intercostal pain: Secondary | ICD-10-CM | POA: Diagnosis not present

## 2014-05-26 DIAGNOSIS — Z951 Presence of aortocoronary bypass graft: Secondary | ICD-10-CM | POA: Diagnosis not present

## 2014-05-26 DIAGNOSIS — R0602 Shortness of breath: Secondary | ICD-10-CM | POA: Diagnosis present

## 2014-05-26 DIAGNOSIS — Z79899 Other long term (current) drug therapy: Secondary | ICD-10-CM | POA: Diagnosis not present

## 2014-05-26 DIAGNOSIS — Z9981 Dependence on supplemental oxygen: Secondary | ICD-10-CM | POA: Diagnosis not present

## 2014-05-26 DIAGNOSIS — I447 Left bundle-branch block, unspecified: Secondary | ICD-10-CM | POA: Diagnosis present

## 2014-05-26 DIAGNOSIS — J9620 Acute and chronic respiratory failure, unspecified whether with hypoxia or hypercapnia: Secondary | ICD-10-CM | POA: Diagnosis present

## 2014-05-26 DIAGNOSIS — R072 Precordial pain: Secondary | ICD-10-CM | POA: Diagnosis not present

## 2014-05-26 LAB — COMPREHENSIVE METABOLIC PANEL
ALK PHOS: 103 U/L (ref 38–126)
ALT: 17 U/L (ref 14–54)
AST: 18 U/L (ref 15–41)
Albumin: 3.4 g/dL — ABNORMAL LOW (ref 3.5–5.0)
Anion gap: 9 (ref 5–15)
BILIRUBIN TOTAL: 0.9 mg/dL (ref 0.3–1.2)
BUN: 11 mg/dL (ref 6–20)
CHLORIDE: 96 mmol/L — AB (ref 101–111)
CO2: 33 mmol/L — ABNORMAL HIGH (ref 22–32)
Calcium: 9.2 mg/dL (ref 8.9–10.3)
Creatinine, Ser: 0.89 mg/dL (ref 0.44–1.00)
Glucose, Bld: 207 mg/dL — ABNORMAL HIGH (ref 65–99)
POTASSIUM: 4 mmol/L (ref 3.5–5.1)
Sodium: 138 mmol/L (ref 135–145)
TOTAL PROTEIN: 7.4 g/dL (ref 6.5–8.1)

## 2014-05-26 LAB — GLUCOSE, CAPILLARY
GLUCOSE-CAPILLARY: 257 mg/dL — AB (ref 65–99)
GLUCOSE-CAPILLARY: 272 mg/dL — AB (ref 65–99)
Glucose-Capillary: 257 mg/dL — ABNORMAL HIGH (ref 65–99)

## 2014-05-26 LAB — I-STAT TROPONIN, ED: Troponin i, poc: 0.01 ng/mL (ref 0.00–0.08)

## 2014-05-26 LAB — CBC WITH DIFFERENTIAL/PLATELET
Basophils Absolute: 0 10*3/uL (ref 0.0–0.1)
Basophils Relative: 1 % (ref 0–1)
Eosinophils Absolute: 0.2 10*3/uL (ref 0.0–0.7)
Eosinophils Relative: 4 % (ref 0–5)
HEMATOCRIT: 43.2 % (ref 36.0–46.0)
HEMOGLOBIN: 14.1 g/dL (ref 12.0–15.0)
LYMPHS ABS: 1.9 10*3/uL (ref 0.7–4.0)
Lymphocytes Relative: 32 % (ref 12–46)
MCH: 30.7 pg (ref 26.0–34.0)
MCHC: 32.6 g/dL (ref 30.0–36.0)
MCV: 93.9 fL (ref 78.0–100.0)
MONO ABS: 0.7 10*3/uL (ref 0.1–1.0)
MONOS PCT: 12 % (ref 3–12)
NEUTROS ABS: 3.1 10*3/uL (ref 1.7–7.7)
Neutrophils Relative %: 51 % (ref 43–77)
Platelets: 177 10*3/uL (ref 150–400)
RBC: 4.6 MIL/uL (ref 3.87–5.11)
RDW: 14.7 % (ref 11.5–15.5)
WBC: 5.9 10*3/uL (ref 4.0–10.5)

## 2014-05-26 LAB — CBG MONITORING, ED: GLUCOSE-CAPILLARY: 210 mg/dL — AB (ref 65–99)

## 2014-05-26 LAB — TROPONIN I: Troponin I: <0.03 ng/mL (ref <0.031)

## 2014-05-26 LAB — MRSA PCR SCREENING: MRSA BY PCR: NEGATIVE

## 2014-05-26 LAB — PROCALCITONIN: Procalcitonin: <0.1 ng/mL

## 2014-05-26 MED ORDER — ASPIRIN EC 325 MG PO TBEC
325.0000 mg | DELAYED_RELEASE_TABLET | Freq: Every day | ORAL | Status: DC
  Administered 2014-05-26 – 2014-05-30 (×5): 325 mg via ORAL
  Filled 2014-05-26 (×5): qty 1

## 2014-05-26 MED ORDER — ACETAMINOPHEN 325 MG PO TABS: 650.0000 mg | ORAL_TABLET | ORAL | Status: DC | PRN

## 2014-05-26 MED ORDER — POTASSIUM PHOSPHATE MONOBASIC 500 MG PO TABS
500.0000 mg | ORAL_TABLET | Freq: Every day | ORAL | Status: DC
  Filled 2014-05-26: qty 1

## 2014-05-26 MED ORDER — DEXTROSE 5 % IV SOLN
1.0000 g | Freq: Every day | INTRAVENOUS | Status: DC
  Administered 2014-05-26 – 2014-05-27 (×2): 1 g via INTRAVENOUS
  Filled 2014-05-26 (×4): qty 10

## 2014-05-26 MED ORDER — HYDROMORPHONE HCL 1 MG/ML IJ SOLN
1.0000 mg | Freq: Once | INTRAMUSCULAR | Status: AC
  Administered 2014-05-26: 1 mg via INTRAVENOUS
  Filled 2014-05-26: qty 1

## 2014-05-26 MED ORDER — NITROGLYCERIN IN D5W 200-5 MCG/ML-% IV SOLN
2.0000 ug/min | INTRAVENOUS | Status: DC
  Administered 2014-05-26: 2 ug/min via INTRAVENOUS
  Filled 2014-05-26: qty 250

## 2014-05-26 MED ORDER — DIPHENHYDRAMINE HCL 50 MG/ML IJ SOLN
25.0000 mg | Freq: Once | INTRAMUSCULAR | Status: AC
  Administered 2014-05-26: 25 mg via INTRAVENOUS
  Filled 2014-05-26: qty 1

## 2014-05-26 MED ORDER — LATANOPROST 0.005 % OP SOLN
1.0000 [drp] | Freq: Every day | OPHTHALMIC | Status: DC
  Administered 2014-05-26 – 2014-05-29 (×4): 1 [drp] via OPHTHALMIC
  Filled 2014-05-26: qty 2.5

## 2014-05-26 MED ORDER — ENOXAPARIN SODIUM 40 MG/0.4ML ~~LOC~~ SOLN
40.0000 mg | Freq: Every day | SUBCUTANEOUS | Status: DC
  Administered 2014-05-26 – 2014-05-30 (×5): 40 mg via SUBCUTANEOUS
  Filled 2014-05-26 (×5): qty 0.4

## 2014-05-26 MED ORDER — LISINOPRIL 10 MG PO TABS
10.0000 mg | ORAL_TABLET | Freq: Every day | ORAL | Status: DC
  Administered 2014-05-27 – 2014-05-30 (×4): 10 mg via ORAL
  Filled 2014-05-26 (×5): qty 1

## 2014-05-26 MED ORDER — FUROSEMIDE 10 MG/ML IJ SOLN
40.0000 mg | Freq: Two times a day (BID) | INTRAMUSCULAR | Status: DC
  Administered 2014-05-26 – 2014-05-27 (×4): 40 mg via INTRAVENOUS
  Filled 2014-05-26 (×6): qty 4

## 2014-05-26 MED ORDER — DEXTROSE 5 % IV SOLN
500.0000 mg | Freq: Every day | INTRAVENOUS | Status: DC
  Administered 2014-05-26: 500 mg via INTRAVENOUS
  Filled 2014-05-26 (×3): qty 500

## 2014-05-26 MED ORDER — INSULIN GLARGINE 100 UNIT/ML ~~LOC~~ SOLN
10.0000 [IU] | Freq: Every day | SUBCUTANEOUS | Status: DC
  Administered 2014-05-26: 10 [IU] via SUBCUTANEOUS
  Filled 2014-05-26 (×2): qty 0.1

## 2014-05-26 MED ORDER — ISOSORBIDE DINITRATE 5 MG PO TABS
5.0000 mg | ORAL_TABLET | Freq: Every day | ORAL | Status: DC
  Administered 2014-05-26: 5 mg via ORAL
  Filled 2014-05-26: qty 1

## 2014-05-26 MED ORDER — ALBUTEROL SULFATE HFA 108 (90 BASE) MCG/ACT IN AERS: 2.0000 | INHALATION_SPRAY | Freq: Four times a day (QID) | RESPIRATORY_TRACT | Status: DC | PRN

## 2014-05-26 MED ORDER — AMITRIPTYLINE HCL 10 MG PO TABS
10.0000 mg | ORAL_TABLET | Freq: Every day | ORAL | Status: DC
  Administered 2014-05-26 – 2014-05-29 (×4): 10 mg via ORAL
  Filled 2014-05-26 (×5): qty 1

## 2014-05-26 MED ORDER — IOHEXOL 350 MG/ML SOLN
100.0000 mL | Freq: Once | INTRAVENOUS | Status: AC | PRN
  Administered 2014-05-26: 100 mL via INTRAVENOUS

## 2014-05-26 MED ORDER — SPIRONOLACTONE 25 MG PO TABS
25.0000 mg | ORAL_TABLET | Freq: Every day | ORAL | Status: DC
  Administered 2014-05-26 – 2014-05-30 (×5): 25 mg via ORAL
  Filled 2014-05-26 (×5): qty 1

## 2014-05-26 MED ORDER — INSULIN ASPART 100 UNIT/ML ~~LOC~~ SOLN
0.0000 [IU] | Freq: Three times a day (TID) | SUBCUTANEOUS | Status: DC
  Administered 2014-05-26 – 2014-05-27 (×3): 5 [IU] via SUBCUTANEOUS
  Administered 2014-05-27: 2 [IU] via SUBCUTANEOUS
  Administered 2014-05-27: 5 [IU] via SUBCUTANEOUS
  Filled 2014-05-26: qty 1

## 2014-05-26 MED ORDER — NITROGLYCERIN 0.4 MG SL SUBL
SUBLINGUAL_TABLET | SUBLINGUAL | Status: AC
  Filled 2014-05-26: qty 1

## 2014-05-26 MED ORDER — NITROGLYCERIN 0.4 MG SL SUBL
0.4000 mg | SUBLINGUAL_TABLET | SUBLINGUAL | Status: DC | PRN
  Administered 2014-05-26: 0.4 mg via SUBLINGUAL
  Filled 2014-05-26: qty 1

## 2014-05-26 MED ORDER — LEVOFLOXACIN IN D5W 750 MG/150ML IV SOLN
750.0000 mg | Freq: Once | INTRAVENOUS | Status: AC
  Administered 2014-05-26: 750 mg via INTRAVENOUS
  Filled 2014-05-26: qty 150

## 2014-05-26 MED ORDER — CARVEDILOL 25 MG PO TABS
25.0000 mg | ORAL_TABLET | Freq: Two times a day (BID) | ORAL | Status: DC
  Administered 2014-05-26 – 2014-05-30 (×9): 25 mg via ORAL
  Filled 2014-05-26 (×10): qty 1

## 2014-05-26 MED ORDER — FUROSEMIDE 20 MG PO TABS
80.0000 mg | ORAL_TABLET | Freq: Every day | ORAL | Status: DC
  Administered 2014-05-26: 80 mg via ORAL
  Filled 2014-05-26: qty 4

## 2014-05-26 MED ORDER — MORPHINE SULFATE 2 MG/ML IJ SOLN
2.0000 mg | INTRAMUSCULAR | Status: DC | PRN
  Administered 2014-05-26: 2 mg via INTRAVENOUS
  Filled 2014-05-26: qty 1

## 2014-05-26 MED ORDER — CETYLPYRIDINIUM CHLORIDE 0.05 % MT LIQD
7.0000 mL | Freq: Two times a day (BID) | OROMUCOSAL | Status: DC
  Administered 2014-05-26 – 2014-05-30 (×7): 7 mL via OROMUCOSAL

## 2014-05-26 MED ORDER — ATORVASTATIN CALCIUM 80 MG PO TABS
80.0000 mg | ORAL_TABLET | Freq: Every day | ORAL | Status: DC
  Administered 2014-05-27 – 2014-05-29 (×3): 80 mg via ORAL
  Filled 2014-05-26 (×4): qty 1

## 2014-05-26 MED ORDER — K PHOS MONO-SOD PHOS DI & MONO 155-852-130 MG PO TABS
500.0000 mg | ORAL_TABLET | Freq: Every day | ORAL | Status: DC
  Administered 2014-05-26 – 2014-05-29 (×4): 500 mg via ORAL
  Filled 2014-05-26 (×5): qty 2

## 2014-05-26 MED ORDER — ONDANSETRON HCL 4 MG/2ML IJ SOLN: 4.0000 mg | Freq: Four times a day (QID) | INTRAMUSCULAR | Status: DC | PRN

## 2014-05-26 NOTE — Consult Note (Signed)
CONSULT NOTE  Date: 05/26/2014               Patient Name:  [name] MRN: [id number]  DOB: 1950-11-12 Age / Sex: 64 y.o., female        PCP: Imelda Pillow Primary Cardiologist: Marlou Porch            Referring Physician: Daleen Bo              Reason for Consult: Flank pain, back pain            History of Present Illness: Patient is a 64 y.o. female with a PMHx of CAD / CABG 0240 , chronic systolic CHF - EF 97-35%,  HTN, morbid obesity, DM2, OSA , who was admitted to [facility] on 05/25/2014 for evaluation of chest pain and right sided flank pain.    CT angiogram is negative for PE, shows pulmonary infiltrates,  Productive cough, vomitted x 3 yesterday , denies any fever or chills ,  *.  3-5 days of constant back pain and pain under left breast. NTG by ems seemed to help Not similar to CP in 2007.  Does  Not have cp routinely. Does not exercise - has shortness of breath   Getting up and Moving around seems to make this pain worse.  Not tender to palpitation Associated with shortness of breath .  Still having left breast pain.   Non smoker      Medications: Outpatient medications:  (Not in a hospital admission)  Current medications: Current Facility-Administered Medications  Medication Dose Route Frequency Provider Last Rate Last Dose  . acetaminophen (TYLENOL) tablet 650 mg  650 mg Oral Q4H PRN Rise Patience, MD      . albuterol (PROVENTIL HFA;VENTOLIN HFA) 108 (90 BASE) MCG/ACT inhaler 2 puff  2 puff Inhalation Q6H PRN Rise Patience, MD      . amitriptyline (ELAVIL) tablet 10 mg  10 mg Oral QHS Rise Patience, MD      . aspirin EC tablet 325 mg  325 mg Oral Daily Rise Patience, MD      . atorvastatin (LIPITOR) tablet 80 mg  80 mg Oral q1800 Rise Patience, MD      . azithromycin (ZITHROMAX) 500 mg in dextrose 5 % 250 mL IVPB  500 mg Intravenous QHS Kinnie Feil, MD      . carvedilol (COREG) tablet 25 mg  25 mg Oral BID WC  Rise Patience, MD   25 mg at 05/26/14 3299  . cefTRIAXone (ROCEPHIN) 1 g in dextrose 5 % 50 mL IVPB  1 g Intravenous Daily Kinnie Feil, MD      . enoxaparin (LOVENOX) injection 40 mg  40 mg Subcutaneous Daily Rise Patience, MD      . furosemide (LASIX) tablet 80 mg  80 mg Oral Daily Rise Patience, MD      . insulin aspart (novoLOG) injection 0-9 Units  0-9 Units Subcutaneous TID WC Rise Patience, MD   0 Units at 05/26/14 0825  . isosorbide dinitrate (ISORDIL) tablet 5 mg  5 mg Oral Daily Rise Patience, MD      . latanoprost (XALATAN) 0.005 % ophthalmic solution 1 drop  1 drop Both Eyes QHS Rise Patience, MD      . lisinopril (PRINIVIL,ZESTRIL) tablet 10 mg  10 mg Oral Daily Rise Patience, MD      . morphine 2 MG/ML injection  2 mg  2 mg Intravenous Q2H PRN Rise Patience, MD      . ondansetron [facility]) injection 4 mg  4 mg Intravenous Q6H PRN Rise Patience, MD      . potassium phosphate (monobasic) (K-PHOS ORIGINAL) tablet 500 mg  500 mg Oral QHS Rise Patience, MD      . spironolactone (ALDACTONE) tablet 25 mg  25 mg Oral Daily Rise Patience, MD       Current Outpatient Prescriptions  Medication Sig Dispense Refill  . albuterol (PROVENTIL HFA;VENTOLIN HFA) 108 (90 BASE) MCG/ACT inhaler Inhale 2 puffs into the lungs every 6 (six) hours as needed for wheezing or shortness of breath.    Marland Kitchen amitriptyline (ELAVIL) 10 MG tablet Take 10 mg by mouth at bedtime.    Marland Kitchen aspirin EC 81 MG EC tablet Take 1 tablet (81 mg total) by mouth daily. 30 tablet 12  . Atorvastatin Calcium (LIPITOR PO) Take 80 mg by mouth 2 (two) times a week.     . carvedilol (COREG) 25 MG tablet Take 1 tablet (25 mg total) by mouth 2 (two) times daily with a meal. 60 tablet 3  . furosemide (LASIX) 80 MG tablet Take 1 tablet (80 mg total) by mouth daily. 30 tablet 3  . insulin lispro (HUMALOG) 100 UNIT/ML injection Inject 85-100 Units into the skin 2 (two) times daily.  Use 50 units in the morning and 85 units at lunch    . Iron-Vitamin C (IRON 100/C PO) Take 1 tablet by mouth every other day.     . isosorbide dinitrate (ISORDIL) 5 MG tablet Take 1 tablet (5 mg total) by mouth daily. 30 tablet 3  . latanoprost (XALATAN) 0.005 % ophthalmic solution Place 1 drop into both eyes at bedtime.    Marland Kitchen lisinopril (PRINIVIL,ZESTRIL) 10 MG tablet Take 1 tablet (10 mg total) by mouth daily. 30 tablet 3  . potassium phosphate, monobasic, (K-PHOS ORIGINAL) 500 MG tablet Take 500 mg by mouth at bedtime.     Marland Kitchen spironolactone (ALDACTONE) 25 MG tablet Take 1 tablet (25 mg total) by mouth daily. 30 tablet 3     Allergies  Allergen Reactions  . Metformin And Related Itching, Swelling and Other (See Comments)    Leg pain & swelling in legs  . Levofloxacin Itching     Past Medical History  Diagnosis Date  . Coronary artery disease     s/p CABG 2007  . Sleep apnea     uses O2 at night  . Ischemic cardiomyopathy     EF 30-35%, s/p ICD 4/08 by Dr Leonia Reeves  . Diabetes mellitus     type II  . Hypertension   . Obesity   . CHF (congestive heart failure)   . Oxygen dependent 2 L  . Morbid obesity 11/06/2012  . Angina decubitus 11/06/2012  . 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 Chronic combined systolic and diastolic CHF, NYHA class 3   . Angina pectoris     Past Surgical History  Procedure Laterality Date  . Cardiac catheterization    . Cardiac defibrillator placement  4/08    by Dr Leonia Reeves ([facility])  . Coronary artery bypass graft  10/05/2005    by Dr Cyndia Bent  . Implantable cardioverter defibrillator implant  09/25/12    attempt of upgrade to CRT-D unsuccessful due to CS  anatomy, SJM Unify Asaura device placed with LV port capped by Dr Rayann Heman  . Bi-ventricular implantable cardioverter defibrillator upgrade N/A 09/25/2012    Procedure: BI-VENTRICULAR IMPLANTABLE CARDIOVERTER  DEFIBRILLATOR UPGRADE;  Surgeon: Coralyn Mark, MD;  Location: [facility] CATH LAB;  Service: Cardiovascular;  Laterality: N/A;    Family History  Problem Relation Age of Onset  . Diabetes Mother 5    died - HTN  . Other      No early family hx of CAD    Social History:  reports that she has never smoked. She does not have any smokeless tobacco history on file. She reports that she does not drink alcohol or use illicit drugs.   Review of Systems: Constitutional:  denies fever, chills, diaphoresis, appetite change and fatigue.  HEENT: denies photophobia, eye pain, redness, hearing loss, ear pain, congestion, sore throat, rhinorrhea, sneezing, neck pain, neck stiffness and tinnitus.  Respiratory: denies SOB, DOE, cough, chest tightness, and wheezing.  Cardiovascular: admits to chest pain for the past 3 days    Gastrointestinal: denies nausea, vomiting, abdominal pain, diarrhea, constipation, blood in stool.  Genitourinary: denies dysuria, urgency, frequency, hematuria, flank pain and difficulty urinating.  Musculoskeletal: denies  myalgias, back pain, joint swelling, arthralgias and gait problem.   Skin: denies pallor, rash and wound.  Neurological: denies dizziness, seizures, syncope, weakness, light-headedness, numbness and headaches.   Hematological: denies adenopathy, easy bruising, personal or family bleeding history.  Psychiatric/ Behavioral: denies suicidal ideation, mood changes, confusion, nervousness, sleep disturbance and agitation.    Physical Exam: BP 148/99 mmHg  Pulse 74  Temp(Src) 98.5 F (36.9 C)  Resp 20  Ht 5\' 3"  (1.6 m)  Wt 120.203 kg (265 lb)  BMI 46.95 kg/m2  SpO2 95%  Wt Readings from Last 3 Encounters:  05/25/14 120.203 kg (265 lb)  03/12/14 121.564 kg (268 lb)  02/08/14 121.11 kg (267 lb)    General: Vital signs reviewed and noted. Morbidly obese female,   Head: Normocephalic, atraumatic, sclera anicteric,   Neck: Supple. Negative for carotid bruits.  No JVD   Lungs:  Bilateral tight wheezes,   Bilateral rales.   Heart: RRR with S1 S2. No murmurs, rubs, or gallops   Abdomen/ GI :  Soft, non-tender, non-distended with normoactive bowel sounds. No hepatomegaly. No rebound/guarding. No obvious abdominal masses   MSK: Strength and the appear normal for age.   Extremities: No clubbing or cyanosis. No edema.  Distal pedal pulses are 2+ and equal   Neurologic:  CN are grossly intact,  No obvious motor or sensory defect.  Alert and oriented X 3. Moves all extremities spontaneously.  Psych: Responds to questions appropriately with a normal affect.     Lab results: Basic Metabolic Panel:  Recent Labs Lab 05/25/14 2056 05/26/14 0723  NA 136 138  K 3.7 4.0  CL 96* 96*  CO2 28 33*  GLUCOSE 215* 207*  BUN 10 11  CREATININE 0.83 0.89  CALCIUM 9.5 9.2    Liver Function Tests:  Recent Labs Lab 05/26/14 0723  AST 18  ALT 17  ALKPHOS 103  BILITOT 0.9  PROT 7.4  ALBUMIN 3.4*   No results for input(s): LIPASE, AMYLASE in the last 168 hours. No results for input(s): AMMONIA in the last 168 hours.  CBC:  Recent Labs Lab 05/25/14 2056 05/26/14 0723  WBC 6.7 5.9  NEUTROABS  --  3.1  HGB 15.1* 14.1  HCT 45.4 43.2  MCV 92.7 93.9  PLT 169 177  Cardiac Enzymes:  Recent Labs Lab 05/26/14 0723  TROPONINI <0.03    BNP: Invalid input(s): POCBNP  CBG:  Recent Labs Lab 05/26/14 0715  GLUCAP 210*    Coagulation Studies: No results for input(s): LABPROT, INR in the last 72 hours.   Other results:  Personal review of EKG shows :   NSR , LBBB  , no changes from previous tracing     Imaging: Dg Chest 2 View  05/25/2014   ADDENDUM REPORT: 05/25/2014 23:19  ADDENDUM: This addendum is for clarification of the impression.  1. Low lung volumes with increased linear opacities at the right lung base, may reflect atelectasis and hypoventilatory change. Minimal pneumonia could have a similar appearance. The linear opacity  in the left mid lung zone has the appearance atelectasis or scarring. 2. Grossly stable cardiomegaly.   Electronically Signed   By: Jeb Levering M.D.   On: 05/25/2014 23:19   05/25/2014   CLINICAL DATA:  Shortness of breath, posterior right and left lower chest pain. Symptoms for 1 week.  EXAM: CHEST  2 VIEW  COMPARISON:  11/26/2013  FINDINGS: Patient is post median sternotomy. Dual lead left-sided pacemaker remains in place. Lower lung volumes from prior exam leading to accentuation of cardiac size and crowding of bronchovascular structures. Cardiomegaly is grossly stable allowing for differences in technique. There are increasing linear opacities at the right lung base. Linear opacity in the left midlung zone, atelectasis or scarring. Mild central vascular congestion without gross pulmonary edema. No pleural effusion or pneumothorax. Degenerative change in the spine without acute osseous abnormality.  IMPRESSION: 1. Low lung volumes with increased linear opacities at the right lung base, may reflect atelectasis and hypoventilatory change. Minimal pneumonia could have a similar appearance. 2. Grossly stable cardiomegaly.  Mild vascular congestion.  Electronically Signed: By: Jeb Levering M.D. On: 05/25/2014 22:59   Ct Angio Chest Pe W/cm &/or Wo Cm  05/26/2014   CLINICAL DATA:  Acute onset of left-sided chest pain and worsening shortness of breath. Initial encounter.  EXAM: CT ANGIOGRAPHY CHEST WITH CONTRAST  TECHNIQUE: Multidetector CT imaging of the chest was performed using the standard protocol during bolus administration of intravenous contrast. Multiplanar CT image reconstructions and MIPs were obtained to evaluate the vascular anatomy.  CONTRAST:  161mL OMNIPAQUE IOHEXOL 350 MG/ML SOLN  COMPARISON:  Chest radiograph performed 05/25/2014  FINDINGS: There is no evidence of significant pulmonary embolus.  Mild bilateral patchy airspace opacities may reflect atelectasis or possibly mild infection.  There is no evidence of pleural effusion or pneumothorax. No masses are identified; no abnormal focal contrast enhancement is seen.  The patient is status post median sternotomy. The patient's pacemaker/AICD is noted at the left chest wall, with associated leads. Diffuse coronary artery calcifications are seen. Scattered calcification is noted along the thoracic aorta. No pericardial effusion is identified. No axillary lymphadenopathy is seen. There is suggestion of a peripherally calcified 2.5 cm hypodensity at the left thyroid lobe.  The visualized portions of the liver and spleen are unremarkable.  No acute osseous abnormalities are seen. Vacuum phenomenon is noted along the lower thoracic spine.  Review of the MIP images confirms the above findings.  IMPRESSION: 1. No evidence of significant pulmonary embolus. 2. Mild patchy bilateral airspace opacities may reflect atelectasis or possibly mild infection. 3. Diffuse coronary artery calcifications seen. 4. Suggestion of peripherally calcified 2.5 cm hypodense nodule at the left thyroid lobe. Consider further evaluation with thyroid ultrasound. If patient is clinically hyperthyroid, consider  nuclear medicine thyroid uptake and scan.   Electronically Signed   By: Garald Balding M.D.   On: 05/26/2014 01:37   US Abdomen Complete  05/26/2014   CLINICAL DATA:  Initial evaluation for acute right flank discomfort.  EXAM: ULTRASOUND ABDOMEN COMPLETE  COMPARISON:  Prior CT from 11/30/2003  FINDINGS: Gallbladder: 8.5 mm echogenic non shadowing focus within the gallbladder lumen may reflect stones and/or sludge. No gallbladder wall thickening. No free pericholecystic fluid. No sonographic Murphy sign elicited on exam.  Common bile duct: Diameter: 3.4 mm  Liver: No focal lesion identified. Diffusely increased echogenicity, suggestive of steatosis.  IVC: No abnormality visualized.  Pancreas: Visualized portion unremarkable.  Spleen: Size and appearance within normal limits.   Right Kidney: Length: 10.1 cm. Echogenicity within normal limits. 7.9 mm echogenic focus within the lower pole noted, which may reflect a small stone.  Left Kidney: Length: 11.7 cm. Echogenicity within normal limits. Lower pole not well visualized due to overlying bowel gas. No mass or hydronephrosis visualized.  Abdominal aorta: No aneurysm visualized.  Other findings: None.  IMPRESSION: 1. 7.9 mm echogenic focus within the lower pole the right kidney, suspicious for a possible right renal calculus. No hydronephrosis. 2. Stones and/or sludge within the gallbladder lumen without sonographic evidence for acute cholecystitis or biliary dilatation. 3. Hepatic steatosis.   Electronically Signed   By: Jeannine Boga M.D.   On: 05/26/2014 04:02      Assessment & Plan: 1. Chest pain :  Pain is somewhat atypical.    Troponins are negative despite having this discomfort for the past 3-5 days.   Pains are not similar to her previous episodes of angina but did respond to SL NTG.  Agree with a stress test but she needs to be treated for her pneumonia before the stress test. .   Will see if IV ntg helps   Hold ISDN for now   2, chronic systolic CHF:    Will give her some IV lasix,   Will interrogate her ICD to evaluate her volume status.  She has a LBBB  But was not able to be upgraded to an Bi-V pacer due to coronary sinus anatomy.   3. Pneumonia :   She is having lots of wheezing.  CXR suggestive of pneumonia vs. CHF.  Plans per int. Medicine team .   4. DM - plans per int. Med.   5. Morbid obesity   6. Essential HTN :  Cont. Meds.    Thayer Headings, Brooke Bonito., MD, [facility] 05/26/2014, 8:51 AM Office - [phone] Pager [phone]

## 2014-05-26 NOTE — Progress Notes (Signed)
[facility] PROGRESS NOTE  [name] GGY:[id number] DOB: 1950/01/14 DOA: 05/25/2014 PCP: Imelda Pillow, NP  Assessment/Plan: 64 y/o female with PMH of CAD h/o CABG, CHF s/p ICD, HTN, OSA, DM presented with L sided chest, associated with SOB, DOE. She also reported R sided flank pain  -admitted with PNA, CHF, Nephrolithiasis   1. Possible BL pneumonia. CT chest: Mild patchy bilateral airspace opacities. +productive cough. Started on IV atx.  -afebrile, no significant leukocytosis. No significant hypoxia. Will cont atx, deescalate in 24-48 hrs  2. CAD h/o CABG.  -Chest pains, atypical. Trop neg. Chest tnderness on palpation. Cont home regimen.  3. CHF mild volume overloaded. Systolic HF. LVEF 03-50%. s/p ICD -started IV lasix, cont spironolactone, ACE, BB 4. IDDM. No recent ha1c. Patient reports taking novolog [phone] with each meal. But reports hypoglycemia at home   -Check a1c.start lantus 10U+ISS. Adjust as needed  5. HTN. Cont diuretics, BB. ACE. Adjust meds as needed  6. Calcified 2.5 cm hypodense nodule at the left thyroid lobe.  -will obtain thyroid Ultrasound 7. R flank pain. Nephrolithiasis. [name]: 7.9 mm echogenic focus within the lower pole the right kidney, suspicious for a possible right renal calculus -UA: no significant RBC, no Hg. Will cont pain control. Needs urology eval outpatient    Code Status: full Family Communication: d/w patient  (indicate person spoken with, relationship, and if by phone, the number) Disposition Plan: home pend clinical improvement    Consultants:  Cardiology   Procedures:  None   Antibiotics:  Levofloxacin 5/18  Ceftriaxone/aziothromycin 5/18>>>   (indicate start date, and stop date if known)  HPI/Subjective: alert  Objective: Filed Vitals:   05/26/14 1415  BP: 121/66  Pulse: 62  Temp:   Resp: 21    Intake/Output Summary (Last 24 hours) at 05/26/14 1432 Last data filed at 05/26/14 1400  Gross per 24  hour  Intake  71.36 ml  Output    300 ml  Net -228.64 ml   Filed Weights   05/25/14 2018  Weight: 120.203 kg (265 lb)    Exam:   General:  No distress, no nausea  Cardiovascular: s1,s2 rrr  Respiratory: BL: LL crackles   Abdomen: soft, nt,nd   Musculoskeletal: mild leg edema   Data Reviewed: Basic Metabolic Panel:  Recent Labs Lab 05/25/14 2056 05/26/14 0723  NA 136 138  K 3.7 4.0  CL 96* 96*  CO2 28 33*  GLUCOSE 215* 207*  BUN 10 11  CREATININE 0.83 0.89  CALCIUM 9.5 9.2   Liver Function Tests:  Recent Labs Lab 05/26/14 0723  AST 18  ALT 17  ALKPHOS 103  BILITOT 0.9  PROT 7.4  ALBUMIN 3.4*   No results for input(s): LIPASE, AMYLASE in the last 168 hours. No results for input(s): AMMONIA in the last 168 hours. CBC:  Recent Labs Lab 05/25/14 2056 05/26/14 0723  WBC 6.7 5.9  NEUTROABS  --  3.1  HGB 15.1* 14.1  HCT 45.4 43.2  MCV 92.7 93.9  PLT 169 177   Cardiac Enzymes:  Recent Labs Lab 05/26/14 0723  TROPONINI <0.03   BNP (last 3 results)  Recent Labs  05/25/14 2056  BNP 362.5*    ProBNP (last 3 results)  Recent Labs  11/26/13 0441  PROBNP 969.8*    CBG:  Recent Labs Lab 05/26/14 0715  GLUCAP 210*    No results found for this or any previous visit (from the past 240 hour(s)).   Studies: Dg Chest  2 View  05/25/2014   ADDENDUM REPORT: 05/25/2014 23:19  ADDENDUM: This addendum is for clarification of the impression.  1. Low lung volumes with increased linear opacities at the right lung base, may reflect atelectasis and hypoventilatory change. Minimal pneumonia could have a similar appearance. The linear opacity in the left mid lung zone has the appearance atelectasis or scarring. 2. Grossly stable cardiomegaly.   Electronically Signed   By: Jeb Levering M.D.   On: 05/25/2014 23:19   05/25/2014   CLINICAL DATA:  Shortness of breath, posterior right and left lower chest pain. Symptoms for 1 week.  EXAM: CHEST  2 VIEW   COMPARISON:  11/26/2013  FINDINGS: Patient is post median sternotomy. Dual lead left-sided pacemaker remains in place. Lower lung volumes from prior exam leading to accentuation of cardiac size and crowding of bronchovascular structures. Cardiomegaly is grossly stable allowing for differences in technique. There are increasing linear opacities at the right lung base. Linear opacity in the left midlung zone, atelectasis or scarring. Mild central vascular congestion without gross pulmonary edema. No pleural effusion or pneumothorax. Degenerative change in the spine without acute osseous abnormality.  IMPRESSION: 1. Low lung volumes with increased linear opacities at the right lung base, may reflect atelectasis and hypoventilatory change. Minimal pneumonia could have a similar appearance. 2. Grossly stable cardiomegaly.  Mild vascular congestion.  Electronically Signed: By: Jeb Levering M.D. On: 05/25/2014 22:59   Ct Angio Chest Pe W/cm &/or Wo Cm  05/26/2014   CLINICAL DATA:  Acute onset of left-sided chest pain and worsening shortness of breath. Initial encounter.  EXAM: CT ANGIOGRAPHY CHEST WITH CONTRAST  TECHNIQUE: Multidetector CT imaging of the chest was performed using the standard protocol during bolus administration of intravenous contrast. Multiplanar CT image reconstructions and MIPs were obtained to evaluate the vascular anatomy.  CONTRAST:  151mL OMNIPAQUE IOHEXOL 350 MG/ML SOLN  COMPARISON:  Chest radiograph performed 05/25/2014  FINDINGS: There is no evidence of significant pulmonary embolus.  Mild bilateral patchy airspace opacities may reflect atelectasis or possibly mild infection. There is no evidence of pleural effusion or pneumothorax. No masses are identified; no abnormal focal contrast enhancement is seen.  The patient is status post median sternotomy. The patient's pacemaker/AICD is noted at the left chest wall, with associated leads. Diffuse coronary artery calcifications are seen.  Scattered calcification is noted along the thoracic aorta. No pericardial effusion is identified. No axillary lymphadenopathy is seen. There is suggestion of a peripherally calcified 2.5 cm hypodensity at the left thyroid lobe.  The visualized portions of the liver and spleen are unremarkable.  No acute osseous abnormalities are seen. Vacuum phenomenon is noted along the lower thoracic spine.  Review of the MIP images confirms the above findings.  IMPRESSION: 1. No evidence of significant pulmonary embolus. 2. Mild patchy bilateral airspace opacities may reflect atelectasis or possibly mild infection. 3. Diffuse coronary artery calcifications seen. 4. Suggestion of peripherally calcified 2.5 cm hypodense nodule at the left thyroid lobe. Consider further evaluation with thyroid ultrasound. If patient is clinically hyperthyroid, consider nuclear medicine thyroid uptake and scan.   Electronically Signed   By: Garald Balding M.D.   On: 05/26/2014 01:37   US Abdomen Complete  05/26/2014   CLINICAL DATA:  Initial evaluation for acute right flank discomfort.  EXAM: ULTRASOUND ABDOMEN COMPLETE  COMPARISON:  Prior CT from 11/30/2003  FINDINGS: Gallbladder: 8.5 mm echogenic non shadowing focus within the gallbladder lumen may reflect stones and/or sludge. No gallbladder wall thickening.  No free pericholecystic fluid. No sonographic Murphy sign elicited on exam.  Common bile duct: Diameter: 3.4 mm  Liver: No focal lesion identified. Diffusely increased echogenicity, suggestive of steatosis.  IVC: No abnormality visualized.  Pancreas: Visualized portion unremarkable.  Spleen: Size and appearance within normal limits.  Right Kidney: Length: 10.1 cm. Echogenicity within normal limits. 7.9 mm echogenic focus within the lower pole noted, which may reflect a small stone.  Left Kidney: Length: 11.7 cm. Echogenicity within normal limits. Lower pole not well visualized due to overlying bowel gas. No mass or hydronephrosis  visualized.  Abdominal aorta: No aneurysm visualized.  Other findings: None.  IMPRESSION: 1. 7.9 mm echogenic focus within the lower pole the right kidney, suspicious for a possible right renal calculus. No hydronephrosis. 2. Stones and/or sludge within the gallbladder lumen without sonographic evidence for acute cholecystitis or biliary dilatation. 3. Hepatic steatosis.   Electronically Signed   By: Jeannine Boga M.D.   On: 05/26/2014 04:02    Scheduled Meds: . amitriptyline  10 mg Oral QHS  . aspirin EC  325 mg Oral Daily  . atorvastatin  80 mg Oral q1800  . azithromycin  500 mg Intravenous QHS  . carvedilol  25 mg Oral BID WC  . cefTRIAXone (ROCEPHIN)  IV  1 g Intravenous Daily  . enoxaparin (LOVENOX) injection  40 mg Subcutaneous Daily  . furosemide  40 mg Intravenous Q12H  . insulin aspart  0-9 Units Subcutaneous TID WC  . latanoprost  1 drop Both Eyes QHS  . lisinopril  10 mg Oral Daily  . nitroGLYCERIN      . potassium phosphate (monobasic)  500 mg Oral QHS  . spironolactone  25 mg Oral Daily   Continuous Infusions: . nitroGLYCERIN 30 mcg/min (05/26/14 1400)    Active Problems:   Ischemic cardiomyopathy   Hypertension   S/P CABG x 5   Chest pain   Right flank pain    Time spent: >35 minutes     Kinnie Feil  [facility] Pager [phone]. If 7PM-7AM, please contact night-coverage at [email or web address], password [email or web address] 05/26/2014, 2:32 PM  LOS: 0 days

## 2014-05-26 NOTE — Progress Notes (Signed)
Pt refusing lipitor. States that lipitor is the cause of skin patches and patches clear up when she doesn't take it. Educated on why she is taking medicine and continues to refuse.

## 2014-05-26 NOTE — ED Notes (Signed)
Patient voided in bedside commode, effort of standing and pivoting caused patient increased ShOB & worsened CP. Pt tachypneic- 30, & SpO2 88%/2L- Pt returned to sitting on side of bed and O2 increased to 4L for 1 min. Paitient respirations decreased to 24 and SpO2 99%/2L.  Patient still endorses chest pain 10/10 nitro increased to 54mcg/min/kg

## 2014-05-26 NOTE — ED Notes (Signed)
AICD being interrogated

## 2014-05-26 NOTE — H&P (Signed)
[facility] History and Physical  [name] DPO:[id number] DOB: 24-Aug-1950 DOA: 05/25/2014  Referring physician: Ms.Kaitlyn. PCP: Imelda Pillow, NP  Specialists: Dr.Skains.  Chief Complaint: Chest pain and right flank pain.  HPI: [name] is a 64 y.o. female with history of CAD status post CABG last cardiac cath in 2011 and stress test in 2013, cardiomyopathy status post AICD placement last EF measured was 30%, OSA, diabetes mellitus type 2, morbid obesity presents to the ER because of chest pain and right flank pain. Patient states she has been having right flank pain over the last 4 days which has been constant and increases on movement. Denies any trauma or fall. Since yesterday patient started developing left-sided chest pain which increases on morning and on deep palpation. CT angiogram of the chest shows bilateral infiltrates. Patient has been having some productive cough to the point she had thrown up 3 times yesterday. Denies any fever or chills. Patient was started on empiric antibiotics and given the patient's cardiac history has been admitted for further management of her chest pain. Patient's right flank is tender on palpation and sonogram is pending. UA is unremarkable. Patient is afebrile.   Review of Systems: As presented in the history of presenting illness, rest negative.  Past Medical History  Diagnosis Date  . Coronary artery disease     s/p CABG 2007  . Sleep apnea     uses O2 at night  . Ischemic cardiomyopathy     EF 30-35%, s/p ICD 4/08 by Dr Leonia Reeves  . Diabetes mellitus     type II  . Hypertension   . Obesity   . CHF (congestive heart failure)   . Oxygen dependent 2 L  . Morbid obesity 11/06/2012  . Angina decubitus 11/06/2012  . 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 Chronic combined systolic and diastolic CHF, NYHA class 3   . Angina pectoris     Past Surgical History  Procedure Laterality Date  . Cardiac catheterization    . Cardiac defibrillator placement  4/08    by Dr Leonia Reeves ([facility])  . Coronary artery bypass graft  10/05/2005    by Dr Cyndia Bent  . Implantable cardioverter defibrillator implant  09/25/12    attempt of upgrade to CRT-D unsuccessful due to CS anatomy, SJM Unify Asaura device placed with LV port capped by Dr Rayann Heman  . Bi-ventricular implantable cardioverter defibrillator upgrade N/A 09/25/2012    Procedure: BI-VENTRICULAR IMPLANTABLE CARDIOVERTER DEFIBRILLATOR UPGRADE;  Surgeon: Coralyn Mark, MD;  Location: [facility] CATH LAB;  Service: Cardiovascular;  Laterality: N/A;   Social History:  reports that she has never smoked. She does not have any smokeless tobacco history on file. She reports that she does not drink alcohol or use illicit drugs. Where does patient live home. Can patient participate in ADLs? Yes.  Allergies  Allergen Reactions  . Metformin And Related Itching, Swelling and Other (See Comments)    Leg pain & swelling in legs    Family History:  Family History  Problem Relation Age of Onset  . Diabetes Mother 45    died - HTN  . Other      No early family hx of CAD      Prior to Admission medications   Medication Sig Start Date End Date Taking? Authorizing Provider  albuterol (PROVENTIL HFA;VENTOLIN HFA) 108 (90  BASE) MCG/ACT inhaler Inhale 2 puffs into the lungs every 6 (six) hours as needed for wheezing or shortness of breath.   Yes Historical Provider, MD  amitriptyline (ELAVIL) 10 MG tablet Take 10 mg by mouth at bedtime.   Yes Historical Provider, MD  aspirin EC 81 MG EC tablet Take 1 tablet (81 mg total) by mouth daily. 09/11/12  Yes Jerline Pain, MD  Atorvastatin Calcium (LIPITOR PO) Take 80 mg by mouth 2 (two) times a week.    Yes Historical Provider, MD  carvedilol (COREG) 25 MG tablet Take 1 tablet (25 mg total) by mouth 2 (two) times daily with a meal. 05/11/14  Yes Jerline Pain, MD   furosemide (LASIX) 80 MG tablet Take 1 tablet (80 mg total) by mouth daily. 05/12/14  Yes Jerline Pain, MD  insulin lispro (HUMALOG) 100 UNIT/ML injection Inject 85-100 Units into the skin 2 (two) times daily. Use 50 units in the morning and 85 units at lunch   Yes Historical Provider, MD  Iron-Vitamin C (IRON 100/C PO) Take 1 tablet by mouth every other day.    Yes Historical Provider, MD  isosorbide dinitrate (ISORDIL) 5 MG tablet Take 1 tablet (5 mg total) by mouth daily. 05/11/14  Yes Jerline Pain, MD  latanoprost (XALATAN) 0.005 % ophthalmic solution Place 1 drop into both eyes at bedtime.   Yes Historical Provider, MD  lisinopril (PRINIVIL,ZESTRIL) 10 MG tablet Take 1 tablet (10 mg total) by mouth daily. 05/11/14  Yes Jerline Pain, MD  potassium phosphate, monobasic, (K-PHOS ORIGINAL) 500 MG tablet Take 500 mg by mouth at bedtime.    Yes Historical Provider, MD  spironolactone (ALDACTONE) 25 MG tablet Take 1 tablet (25 mg total) by mouth daily. 05/11/14  Yes Jerline Pain, MD    Physical Exam: Filed Vitals:   05/26/14 0158 05/26/14 0200 05/26/14 0215 05/26/14 0230  BP: 151/64 144/69 128/56 131/66  Pulse: 74 77 72 76  Temp:      Resp: 27 26 21 22   Height:      Weight:      SpO2: 97% 98% 95% 95%     General:  Obese not in distress.  Eyes: Anicteric no pallor.  ENT: No discharge from the ears eyes nose or mouth.  Neck: No mass felt.  Cardiovascular: S1-S2 heard.  Respiratory: No rhonchi or crepitations.  Abdomen: Mild right flank tenderness. No guarding or rigidity.  Skin: Rash on the abdomen. Chronic.  Musculoskeletal: No edema.  Psychiatric: Appears normal.  Neurologic: Alert awake oriented to time place and person. Moves all extremities.  Labs on Admission:  Basic Metabolic Panel:  Recent Labs Lab 05/25/14 2056  NA 136  K 3.7  CL 96*  CO2 28  GLUCOSE 215*  BUN 10  CREATININE 0.83  CALCIUM 9.5   Liver Function Tests: No results for input(s): AST, ALT,  ALKPHOS, BILITOT, PROT, ALBUMIN in the last 168 hours. No results for input(s): LIPASE, AMYLASE in the last 168 hours. No results for input(s): AMMONIA in the last 168 hours. CBC:  Recent Labs Lab 05/25/14 2056  WBC 6.7  HGB 15.1*  HCT 45.4  MCV 92.7  PLT 169   Cardiac Enzymes: No results for input(s): CKTOTAL, CKMB, CKMBINDEX, TROPONINI in the last 168 hours.  BNP (last 3 results)  Recent Labs  05/25/14 2056  BNP 362.5*    ProBNP (last 3 results)  Recent Labs  11/26/13 0441  PROBNP 969.8*    CBG: No  results for input(s): GLUCAP in the last 168 hours.  Radiological Exams on Admission: Dg Chest 2 View  05/25/2014   ADDENDUM REPORT: 05/25/2014 23:19  ADDENDUM: This addendum is for clarification of the impression.  1. Low lung volumes with increased linear opacities at the right lung base, may reflect atelectasis and hypoventilatory change. Minimal pneumonia could have a similar appearance. The linear opacity in the left mid lung zone has the appearance atelectasis or scarring. 2. Grossly stable cardiomegaly.   Electronically Signed   By: Jeb Levering M.D.   On: 05/25/2014 23:19   05/25/2014   CLINICAL DATA:  Shortness of breath, posterior right and left lower chest pain. Symptoms for 1 week.  EXAM: CHEST  2 VIEW  COMPARISON:  11/26/2013  FINDINGS: Patient is post median sternotomy. Dual lead left-sided pacemaker remains in place. Lower lung volumes from prior exam leading to accentuation of cardiac size and crowding of bronchovascular structures. Cardiomegaly is grossly stable allowing for differences in technique. There are increasing linear opacities at the right lung base. Linear opacity in the left midlung zone, atelectasis or scarring. Mild central vascular congestion without gross pulmonary edema. No pleural effusion or pneumothorax. Degenerative change in the spine without acute osseous abnormality.  IMPRESSION: 1. Low lung volumes with increased linear opacities at  the right lung base, may reflect atelectasis and hypoventilatory change. Minimal pneumonia could have a similar appearance. 2. Grossly stable cardiomegaly.  Mild vascular congestion.  Electronically Signed: By: Jeb Levering M.D. On: 05/25/2014 22:59   Ct Angio Chest Pe W/cm &/or Wo Cm  05/26/2014   CLINICAL DATA:  Acute onset of left-sided chest pain and worsening shortness of breath. Initial encounter.  EXAM: CT ANGIOGRAPHY CHEST WITH CONTRAST  TECHNIQUE: Multidetector CT imaging of the chest was performed using the standard protocol during bolus administration of intravenous contrast. Multiplanar CT image reconstructions and MIPs were obtained to evaluate the vascular anatomy.  CONTRAST:  145mL OMNIPAQUE IOHEXOL 350 MG/ML SOLN  COMPARISON:  Chest radiograph performed 05/25/2014  FINDINGS: There is no evidence of significant pulmonary embolus.  Mild bilateral patchy airspace opacities may reflect atelectasis or possibly mild infection. There is no evidence of pleural effusion or pneumothorax. No masses are identified; no abnormal focal contrast enhancement is seen.  The patient is status post median sternotomy. The patient's pacemaker/AICD is noted at the left chest wall, with associated leads. Diffuse coronary artery calcifications are seen. Scattered calcification is noted along the thoracic aorta. No pericardial effusion is identified. No axillary lymphadenopathy is seen. There is suggestion of a peripherally calcified 2.5 cm hypodensity at the left thyroid lobe.  The visualized portions of the liver and spleen are unremarkable.  No acute osseous abnormalities are seen. Vacuum phenomenon is noted along the lower thoracic spine.  Review of the MIP images confirms the above findings.  IMPRESSION: 1. No evidence of significant pulmonary embolus. 2. Mild patchy bilateral airspace opacities may reflect atelectasis or possibly mild infection. 3. Diffuse coronary artery calcifications seen. 4. Suggestion of  peripherally calcified 2.5 cm hypodense nodule at the left thyroid lobe. Consider further evaluation with thyroid ultrasound. If patient is clinically hyperthyroid, consider nuclear medicine thyroid uptake and scan.   Electronically Signed   By: Garald Balding M.D.   On: 05/26/2014 01:37    EKG: Independently reviewed. Sinus rhythm with LBBB. Patient has history of LBBB.  Assessment/Plan Active Problems:   Ischemic cardiomyopathy   Hypertension   S/P CABG x 5   Chest pain  Right flank pain   1. Chest pain - appears atypical which is reproducible. But given history of CAD we will cycle cardiac markers keep nothing by mouth in a.m. in anticipation of possible procedures. Continue patient's aspirin Imdur statins and beta blockers. 2. Right flank pain - I have ordered a sonogram of the abdomen to check for any intra-abdominal cause for the pain. UA is unremarkable and patient is afebrile. 3. Diabetes mellitus type 2 uncontrolled - we'll need to consultations home dose of insulin. Patient has been flatus and skin coverage. Closely follow CBGs. 4. Ischemic cardiomyopathy with last EF measured was 25-30% status post AICD placement appears compensated - continue diuretics with close follow-up of intake output and daily weights and metabolic panel. 5. Possible pneumonia - patient has been placed on Levaquin. Check pro-calcitonin levels if elevated continue antibiotics. On the hand patient could have aspirated from vomiting. 6. OSA patient uses oxygen at bedtime.   DVT Prophylaxis Lovenox.  Code Status: Full code.  Family Communication: Patient's daughter at the bedside.  Disposition Plan: Admit for observation.    KAKRAKANDY,ARSHAD N. [facility] Pager [phone].  If 7PM-7AM, please contact night-coverage [email or web address] Password [email or web address] 05/26/2014, 3:03 AM

## 2014-05-26 NOTE — ED Notes (Signed)
Notified CT that pt ready with 20G PIV.

## 2014-05-26 NOTE — ED Notes (Signed)
Attempted report 

## 2014-05-27 DIAGNOSIS — E118 Type 2 diabetes mellitus with unspecified complications: Secondary | ICD-10-CM

## 2014-05-27 DIAGNOSIS — J189 Pneumonia, unspecified organism: Secondary | ICD-10-CM | POA: Insufficient documentation

## 2014-05-27 LAB — BASIC METABOLIC PANEL
Anion gap: 10 (ref 5–15)
BUN: 18 mg/dL (ref 6–20)
CO2: 31 mmol/L (ref 22–32)
Calcium: 9 mg/dL (ref 8.9–10.3)
Chloride: 93 mmol/L — ABNORMAL LOW (ref 101–111)
Creatinine, Ser: 1.12 mg/dL — ABNORMAL HIGH (ref 0.44–1.00)
GFR calc Af Amer: 59 mL/min — ABNORMAL LOW (ref >60)
GFR, EST NON AFRICAN AMERICAN: 51 mL/min — AB (ref >60)
Glucose, Bld: 203 mg/dL — ABNORMAL HIGH (ref 65–99)
POTASSIUM: 4.1 mmol/L (ref 3.5–5.1)
SODIUM: 134 mmol/L — AB (ref 135–145)

## 2014-05-27 LAB — HEMOGLOBIN A1C
Hgb A1c MFr Bld: 8.6 % — ABNORMAL HIGH (ref 4.8–5.6)
Mean Plasma Glucose: 200 mg/dL

## 2014-05-27 LAB — GLUCOSE, CAPILLARY
GLUCOSE-CAPILLARY: 186 mg/dL — AB (ref 65–99)
GLUCOSE-CAPILLARY: 266 mg/dL — AB (ref 65–99)
GLUCOSE-CAPILLARY: 185 mg/dL — AB (ref 65–99)

## 2014-05-27 LAB — TSH: TSH: 1.071 u[IU]/mL (ref 0.350–4.500)

## 2014-05-27 MED ORDER — DOXYCYCLINE HYCLATE 100 MG IV SOLR
100.0000 mg | Freq: Two times a day (BID) | INTRAVENOUS | Status: DC
  Administered 2014-05-27: 100 mg via INTRAVENOUS
  Filled 2014-05-27 (×2): qty 100

## 2014-05-27 MED ORDER — ISOSORBIDE MONONITRATE 15 MG HALF TABLET
15.0000 mg | ORAL_TABLET | Freq: Every day | ORAL | Status: DC
  Administered 2014-05-27: 15 mg via ORAL
  Filled 2014-05-27 (×3): qty 1

## 2014-05-27 MED ORDER — INSULIN GLARGINE 100 UNIT/ML ~~LOC~~ SOLN
20.0000 [IU] | Freq: Every day | SUBCUTANEOUS | Status: DC
  Administered 2014-05-27: 20 [IU] via SUBCUTANEOUS
  Filled 2014-05-27: qty 0.2

## 2014-05-27 NOTE — Progress Notes (Signed)
Subjective: Complaints of chest pain, over the past three days, intermiitent, improved last night, now present, off nitro drip. Says nitroglycerin helped. Rates pain as a 7 out of 10. She denies radiation to jaw of arm, says its just painful and underneath her left breast and not similar to prior pain that required CABG in 2007. She has been complaint with all her meds she says, including Aspirin, Coreg, Spironolactone and Statin. Her flank pain has improved.  Objective: Vital signs in last 24 hours: Filed Vitals:   05/26/14 2337 05/27/14 0000 05/27/14 0300 05/27/14 0400  BP: 112/67  128/57 143/63  Pulse: 59     Temp: 98.1 F (36.7 C)  98.4 F (36.9 C)   TempSrc: Oral  Oral   Resp: 22 19 18 21   Height:      Weight:   266 lb 6.4 oz (120.838 kg)   SpO2: 96%  97%    Weight change: 1 lb 6.4 oz (0.635 kg)  Intake/Output Summary (Last 24 hours) at 05/27/14 3546 Last data filed at 05/26/14 2330  Gross per 24 hour  Intake 342.36 ml  Output    600 ml  Net -257.64 ml   General appearance: alert, cooperative, appears stated age and no distress Lungs: wheezes Diffuse expiratory wheezes heard bilat. Heart: regular rate and rhythm, S1, S2 normal, no murmur, click, rub or gallop Abdomen: soft, non-tender; bowel sounds normal; no masses,  no organomegaly Extremities: extremities normal, atraumatic, no cyanosis or edema Pulses: 2+ and symmetric Lab Results: Basic Metabolic Panel:  Recent Labs Lab 05/26/14 0723 05/27/14 0313  NA 138 134*  K 4.0 4.1  CL 96* 93*  CO2 33* 31  GLUCOSE 207* 203*  BUN 11 18  CREATININE 0.89 1.12*  CALCIUM 9.2 9.0   Liver Function Tests:  Recent Labs Lab 05/26/14 0723  AST 18  ALT 17  ALKPHOS 103  BILITOT 0.9  PROT 7.4  ALBUMIN 3.4*     CBC:  Recent Labs Lab 05/25/14 2056 05/26/14 0723  WBC 6.7 5.9  NEUTROABS  --  3.1  HGB 15.1* 14.1  HCT 45.4 43.2  MCV 92.7 93.9  PLT 169 177   Cardiac Enzymes:  Recent Labs Lab 05/26/14 0723  05/26/14 1500 05/26/14 1825  TROPONINI <0.03 <0.03 <0.03   BNP: No results for input(s): PROBNP in the last 168 hours. D-Dimer: No results for input(s): DDIMER in the last 168 hours. CBG:  Recent Labs Lab 05/26/14 0715 05/26/14 1256 05/26/14 1815 05/26/14 2124  GLUCAP 210* 257* 272* 257*   Urinalysis:  Recent Labs Lab 05/25/14 2027  COLORURINE YELLOW  LABSPEC 1.011  PHURINE 5.0  GLUCOSEU NEGATIVE  HGBUR NEGATIVE  BILIRUBINUR NEGATIVE  KETONESUR NEGATIVE  PROTEINUR NEGATIVE  UROBILINOGEN 1.0  NITRITE NEGATIVE  LEUKOCYTESUR TRACE*   Micro Results: Recent Results (from the past 240 hour(s))  MRSA PCR Screening     Status: None   Collection Time: 05/26/14 12:54 PM  Result Value Ref Range Status   MRSA by PCR NEGATIVE NEGATIVE Final    Comment:        The GeneXpert MRSA Assay (FDA approved for NASAL specimens only), is one component of a comprehensive MRSA colonization surveillance program. It is not intended to diagnose MRSA infection nor to guide or monitor treatment for MRSA infections.    Studies/Results: Dg Chest 2 View  05/25/2014   ADDENDUM REPORT: 05/25/2014 23:19  ADDENDUM: This addendum is for clarification of the impression.  1. Low lung volumes with increased  linear opacities at the right lung base, may reflect atelectasis and hypoventilatory change. Minimal pneumonia could have a similar appearance. The linear opacity in the left mid lung zone has the appearance atelectasis or scarring. 2. Grossly stable cardiomegaly.   Electronically Signed   By: Jeb Levering M.D.   On: 05/25/2014 23:19   05/25/2014   CLINICAL DATA:  Shortness of breath, posterior right and left lower chest pain. Symptoms for 1 week.  EXAM: CHEST  2 VIEW  COMPARISON:  11/26/2013  FINDINGS: Patient is post median sternotomy. Dual lead left-sided pacemaker remains in place. Lower lung volumes from prior exam leading to accentuation of cardiac size and crowding of bronchovascular  structures. Cardiomegaly is grossly stable allowing for differences in technique. There are increasing linear opacities at the right lung base. Linear opacity in the left midlung zone, atelectasis or scarring. Mild central vascular congestion without gross pulmonary edema. No pleural effusion or pneumothorax. Degenerative change in the spine without acute osseous abnormality.  IMPRESSION: 1. Low lung volumes with increased linear opacities at the right lung base, may reflect atelectasis and hypoventilatory change. Minimal pneumonia could have a similar appearance. 2. Grossly stable cardiomegaly.  Mild vascular congestion.  Electronically Signed: By: Jeb Levering M.D. On: 05/25/2014 22:59   Ct Angio Chest Pe W/cm &/or Wo Cm  05/26/2014   CLINICAL DATA:  Acute onset of left-sided chest pain and worsening shortness of breath. Initial encounter.  EXAM: CT ANGIOGRAPHY CHEST WITH CONTRAST  TECHNIQUE: Multidetector CT imaging of the chest was performed using the standard protocol during bolus administration of intravenous contrast. Multiplanar CT image reconstructions and MIPs were obtained to evaluate the vascular anatomy.  CONTRAST:  120mL OMNIPAQUE IOHEXOL 350 MG/ML SOLN  COMPARISON:  Chest radiograph performed 05/25/2014  FINDINGS: There is no evidence of significant pulmonary embolus.  Mild bilateral patchy airspace opacities may reflect atelectasis or possibly mild infection. There is no evidence of pleural effusion or pneumothorax. No masses are identified; no abnormal focal contrast enhancement is seen.  The patient is status post median sternotomy. The patient's pacemaker/AICD is noted at the left chest wall, with associated leads. Diffuse coronary artery calcifications are seen. Scattered calcification is noted along the thoracic aorta. No pericardial effusion is identified. No axillary lymphadenopathy is seen. There is suggestion of a peripherally calcified 2.5 cm hypodensity at the left thyroid lobe.   The visualized portions of the liver and spleen are unremarkable.  No acute osseous abnormalities are seen. Vacuum phenomenon is noted along the lower thoracic spine.  Review of the MIP images confirms the above findings.  IMPRESSION: 1. No evidence of significant pulmonary embolus. 2. Mild patchy bilateral airspace opacities may reflect atelectasis or possibly mild infection. 3. Diffuse coronary artery calcifications seen. 4. Suggestion of peripherally calcified 2.5 cm hypodense nodule at the left thyroid lobe. Consider further evaluation with thyroid ultrasound. If patient is clinically hyperthyroid, consider nuclear medicine thyroid uptake and scan.   Electronically Signed   By: Garald Balding M.D.   On: 05/26/2014 01:37   US Abdomen Complete  05/26/2014   CLINICAL DATA:  Initial evaluation for acute right flank discomfort.  EXAM: ULTRASOUND ABDOMEN COMPLETE  COMPARISON:  Prior CT from 11/30/2003  FINDINGS: Gallbladder: 8.5 mm echogenic non shadowing focus within the gallbladder lumen may reflect stones and/or sludge. No gallbladder wall thickening. No free pericholecystic fluid. No sonographic Murphy sign elicited on exam.  Common bile duct: Diameter: 3.4 mm  Liver: No focal lesion identified. Diffusely increased echogenicity,  suggestive of steatosis.  IVC: No abnormality visualized.  Pancreas: Visualized portion unremarkable.  Spleen: Size and appearance within normal limits.  Right Kidney: Length: 10.1 cm. Echogenicity within normal limits. 7.9 mm echogenic focus within the lower pole noted, which may reflect a small stone.  Left Kidney: Length: 11.7 cm. Echogenicity within normal limits. Lower pole not well visualized due to overlying bowel gas. No mass or hydronephrosis visualized.  Abdominal aorta: No aneurysm visualized.  Other findings: None.  IMPRESSION: 1. 7.9 mm echogenic focus within the lower pole the right kidney, suspicious for a possible right renal calculus. No hydronephrosis. 2. Stones and/or  sludge within the gallbladder lumen without sonographic evidence for acute cholecystitis or biliary dilatation. 3. Hepatic steatosis.   Electronically Signed   By: Jeannine Boga M.D.   On: 05/26/2014 04:02   Ct Renal Stone Study  05/26/2014   CLINICAL DATA:  RIGHT flank and back pain, history coronary artery disease post CABG, CHF, diabetes mellitus, ischemic cardiomyopathy  EXAM: CT ABDOMEN AND PELVIS WITHOUT CONTRAST  TECHNIQUE: Multidetector CT imaging of the abdomen and pelvis was performed following the standard protocol without IV contrast. Sagittal and coronal MPR images reconstructed from axial data set. Oral contrast not administered.  COMPARISON:  11/30/2003  FINDINGS: Mild bibasilar atelectasis.  Pacemaker leads RIGHT atrium and RIGHT ventricle.  Enlargement of heart.  Extensive atherosclerotic calcifications without aneurysm.  Excreted contrast material from preceding CT chest of same date opacifies renal collecting systems, ureters and bladder, all limiting assessment for small calculi.  No gross hydronephrosis or ureteral dilatation seen.  Vicarious excretion of contrast material into gallbladder.  Within limits of technique, no focal abnormalities of the liver, spleen, pancreas, kidneys, or adrenal glands.  Stomach and bowel loops grossly normal.  Normal appendix.  Unremarkable bladder, ureters, uterus and LEFT adnexa with small cysts in RIGHT ovary.  No mass, adenopathy, free air, free fluid, or hernia.  Foci subcutaneous gas anterior abdominal wall and LEFT flank question sites of medication injection  Bones demineralized.  IMPRESSION: Scattered atherosclerotic calcification.  No acute intra-abdominal or intrapelvic abnormalities identified.   Electronically Signed   By: Lavonia Dana M.D.   On: 05/26/2014 17:50   Medications: I have reviewed the patient's current medications. Scheduled Meds: . amitriptyline  10 mg Oral QHS  . antiseptic oral rinse  7 mL Mouth Rinse BID  . aspirin EC   325 mg Oral Daily  . atorvastatin  80 mg Oral q1800  . azithromycin  500 mg Intravenous QHS  . carvedilol  25 mg Oral BID WC  . cefTRIAXone (ROCEPHIN)  IV  1 g Intravenous Daily  . enoxaparin (LOVENOX) injection  40 mg Subcutaneous Daily  . furosemide  40 mg Intravenous Q12H  . insulin aspart  0-9 Units Subcutaneous TID WC  . insulin glargine  10 Units Subcutaneous QHS  . latanoprost  1 drop Both Eyes QHS  . lisinopril  10 mg Oral Daily  . phosphorus  500 mg Oral QHS  . spironolactone  25 mg Oral Daily   Continuous Infusions: . nitroGLYCERIN Stopped (05/26/14 1800)   PRN Meds:.acetaminophen, morphine injection, nitroGLYCERIN, ondansetron (ZOFRAN) IV Assessment/Plan: Active Problems:   Ischemic cardiomyopathy   Hypertension   S/P CABG x 5   Chest pain   Right flank pain  1. Chest pain :Persistent Chest pain, not consistent with cardiac Etiology.Troponins are negative despite having this discomfort for the past 3-5 days.But response to NTG. EKG with new T wave inversions in inferior  lateral leads, with prlonged QTc. - Pt actively wheezing, so no stress test till acute issues resolve - Planned Lexiscan Myoview. - Hold Qtc prolonging meds- Like Quinolones and Macrolides- Azithro - Cont BB-Coreg, consider switch to Metop considering bronspasms - Cont Lisinopril Daily - Consider low dose Imdur- for chest pain and CMP- 30mg  daily  2, Ischemic Cardiomyopathy- chronic systolic ZOX:WRUEAV Euvolemic. Will interrogate her ICD to evaluate her volume status. EF- 25-30%- 2014. She has a LBBB But was not able to be upgraded to an Bi-V pacer due to coronary sinus anatomy.  - Consider switch to home Po diuretics. - Cont BB - Can Increase ACE dose, Bp can tolerate- to 20mg  daily  3. Pneumonia : She is having lots of wheezing. CXR suggestive of pneumonia vs. CHF. Plans per int. Medicine team .  - Consider ceftriazone and Doxy instead of Azithro with prolonged QTc.  4. DM - plans  per int. Med.   5. Morbid obesity and OSA likely OHS too.  6. Essential HTN : Cont. Meds.     LOS: 1 day   Bethena Roys, MD 05/27/2014, 7:27 AM

## 2014-05-27 NOTE — Progress Notes (Addendum)
Patient ID: [name], female   DOB: [birth year], 64 y.o.   MRN: [id number] Pt tent scheduled for US guided left thyroid nodule biopsy on 5/20. Imaging was reviewed by Dr. Vernard Gambles. 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 Pt received lovenox injection today therefore will plan case for 5/20 (will hold lovenox until after bx). Details/risks of procedure, including but not limited to internal bleeding, infection, injury to adjacent structures were d/w pt with her consent.

## 2014-05-27 NOTE — Care Management Note (Signed)
Case Management Note  Patient Details  Name: ADELEINE PASK MRN: [id number] Date of Birth: [birth year]  Subjective/Objective:     Adm w ch pain, pneumonia               Action/Plan: lives w husband  Expected Discharge Date:                  Expected Discharge Plan:  [place]  In-House Referral:     Discharge planning Services     Post Acute Care Choice:    Choice offered to:     DME Arranged:    DME Agency:     HH Arranged:    [place] Agency:     Status of Service:     Medicare Important Message Given:    Date Medicare IM Given:    Medicare IM give by:    Date Additional Medicare IM Given:    Additional Medicare Important Message give by:     If discussed at [place] of Stay Meetings, dates discussed:    Additional Comments: ur review done  Lacretia Leigh, RN 05/27/2014, 8:47 AM

## 2014-05-27 NOTE — Progress Notes (Signed)
Patient Name: [name] Date of Encounter: 05/27/2014  Active Problems:   Ischemic cardiomyopathy   Hypertension   S/P CABG x 5   Chest pain   Right flank pain   Length of Stay: 1  SUBJECTIVE  The patient complains of ongoing chest pain.   CURRENT MEDS . amitriptyline  10 mg Oral QHS  . antiseptic oral rinse  7 mL Mouth Rinse BID  . aspirin EC  325 mg Oral Daily  . atorvastatin  80 mg Oral q1800  . carvedilol  25 mg Oral BID WC  . cefTRIAXone (ROCEPHIN)  IV  1 g Intravenous Daily  . doxycycline (VIBRAMYCIN) IV  100 mg Intravenous Q12H  . enoxaparin (LOVENOX) injection  40 mg Subcutaneous Daily  . furosemide  40 mg Intravenous Q12H  . insulin aspart  0-9 Units Subcutaneous TID WC  . insulin glargine  20 Units Subcutaneous QHS  . latanoprost  1 drop Both Eyes QHS  . lisinopril  10 mg Oral Daily  . phosphorus  500 mg Oral QHS  . spironolactone  25 mg Oral Daily   . nitroGLYCERIN Stopped (05/26/14 1800)   OBJECTIVE  Filed Vitals:   05/27/14 0300 05/27/14 0400 05/27/14 0817 05/27/14 1240  BP: 128/57 143/63 155/70 107/56  Pulse:   66 75  Temp: 98.4 F (36.9 C)  99.4 F (37.4 C) 98.1 F (36.7 C)  TempSrc: Oral  Oral Oral  Resp: 18 21 16 16   Height:      Weight: 266 lb 6.4 oz (120.838 kg)     SpO2: 97%  99% 99%    Intake/Output Summary (Last 24 hours) at 05/27/14 1442 Last data filed at 05/27/14 1000  Gross per 24 hour  Intake    321 ml  Output    725 ml  Net   -404 ml   Filed Weights   05/25/14 2018 05/27/14 0300  Weight: 265 lb (120.203 kg) 266 lb 6.4 oz (120.838 kg)    PHYSICAL EXAM  General: Pleasant, NAD. Neuro: Alert and oriented X 3. Moves all extremities spontaneously. Psych: Normal affect. HEENT:  Normal  Neck: Supple without bruits or JVD. Lungs:  Resp regular and unlabored, CTA. Heart: RRR no s3, s4, or murmurs. Abdomen: Soft, non-tender, non-distended, BS + x 4.  Extremities: No clubbing, cyanosis or edema. DP/PT/Radials 2+ and  equal bilaterally.  Accessory Clinical Findings  CBC  Recent Labs  05/25/14 2056 05/26/14 0723  WBC 6.7 5.9  NEUTROABS  --  3.1  HGB 15.1* 14.1  HCT 45.4 43.2  MCV 92.7 93.9  PLT 169 952   Basic Metabolic Panel  Recent Labs  05/26/14 0723 05/27/14 0313  NA 138 134*  K 4.0 4.1  CL 96* 93*  CO2 33* 31  GLUCOSE 207* 203*  BUN 11 18  CREATININE 0.89 1.12*  CALCIUM 9.2 9.0   Liver Function Tests  Recent Labs  05/26/14 0723  AST 18  ALT 17  ALKPHOS 103  BILITOT 0.9  PROT 7.4  ALBUMIN 3.4*   No results for input(s): LIPASE, AMYLASE in the last 72 hours. Cardiac Enzymes  Recent Labs  05/26/14 0723 05/26/14 1500 05/26/14 1825  TROPONINI <0.03 <0.03 <0.03    Recent Labs  05/27/14 1058  TSH 1.071    Radiology/Studies  Ct Angio Chest Pe W/cm &/or Wo Cm  05/26/2014   CLINICAL DATA:  Acute onset of left-sided chest pain and worsening shortness of breath. IMPRESSION: 1. No evidence of significant pulmonary  embolus. 2. Mild patchy bilateral airspace opacities may reflect atelectasis or possibly mild infection. 3. Diffuse coronary artery calcifications seen. 4. Suggestion of peripherally calcified 2.5 cm hypodense nodule at the left thyroid lobe. Consider further evaluation with thyroid ultrasound. If patient is clinically hyperthyroid, consider nuclear medicine thyroid uptake and scan.     US Soft Tissue Head/neck  05/27/2014   CLINICAL DATA:  Thyroid mass.  EXAM: THYROID ULTRASOUND  TECHNIQUE: Ultrasound examination of the thyroid gland and adjacent soft tissues was performed.  COMPARISON:  CT 04/24/2012.  FINDINGS: Right thyroid lobe  Measurements: 4.9 x 1.6 x 1.4 cm. Three tiny sub cm cysts noted in the right thyroid lobe.  Left thyroid lobe  Measurements: 4.5 x 2.5 x 3.0 cm. 3.7 x 2.8 x 2.7 cm solid mass with calcifications in the left thyroid lobe. Thyroid malignancy cannot be excluded.  Isthmus  Thickness: 0.6 cm.  No nodules visualized.  Lymphadenopathy  None  visualized.  IMPRESSION: 3.7 cm solid mass with calcifications in the left thyroid lobe. Findings meet consensus criteria for biopsy. Ultrasound-guided fine needle aspiration should be considered, as per the consensus statement: Management of Thyroid Nodules Detected at [name]: Society of Radiologists in [place]. Radiology 2005; [id number].   Electronically Signed   By: [place]   On: 05/27/2014 07:25    TELE: SR     ASSESSMENT AND PLAN  1. Chest pain : Pain is somewhat atypical. Troponins are negative despite having this discomfort for the past 3-5 days. Pains are not similar to her previous episodes of angina but did respond to SL NTG.  We will start low dose po imdur. Agree with a stress test but she needs to be treated for her pneumonia before the stress test. .   2. Chronic systolic CHF: Will give her IV lasix for 1 more day,She has a LBBB But was not able to be upgraded to an Bi-V pacer due to coronary sinus anatomy.   3. Pneumonia : She is having lots of wheezing. CXR suggestive of pneumonia vs. CHF. Plans per int. Medicine team .   4. DM - plans per int. Med.   5. Morbid obesity   6. Essential HTN : Cont. Meds.    Signed, Dorothy Spark MD, [facility] 05/27/2014

## 2014-05-27 NOTE — Progress Notes (Signed)
[facility] PROGRESS NOTE  [name] GGY:[id number] DOB: 02-17-1950 DOA: 05/25/2014 PCP: Imelda Pillow, NP  Assessment/Plan: 64 y/o female with PMH of CAD h/o CABG, CHF s/p ICD, HTN, OSA, DM presented with L sided chest, associated with SOB, DOE. She also reported R sided flank pain  -admitted with PNA, CHF, chest pains  1. Possible BL pneumonia. CT chest: Mild patchy bilateral airspace opacities. +productive cough. Started on IV atx.  -afebrile, no significant leukocytosis. No significant hypoxia. Will cont atx, deescalate in 24-48 hrs on 5/20 2. CAD h/o CABG.  -Chest pains, atypical. Trop neg. On IV NTG->need to change to PO. Pt still reports mild L sided chest pain. Defer to cardiology further evaluation  3. CHF mild volume overloaded. Systolic HF. LVEF 03-50%. s/p ICD -Pt is still with mild fluid overload on exam. on IV lasix 40 BID per cardiology, cont spironolactone, ACE, BB 4. IDDM. No recent ha1c. Patient reports taking novolog 100-85-85 with each meal PRN?. reports hypoglycemia at home   -Start lantus increased to 20U+ISS. Adjust as needed, pend ha1c  5. HTN. Cont diuretics, BB. ACE. Adjust meds as needed  6. Calcified 2.5 cm hypodense nodule at the left thyroid lobe. Thyroid Ultrasound: 3.7 cm solid mass with calcifications in the left thyroid lobe -will ask for US biopsy. Check TSH  7. R flank pain. Nephrolithiasis. [name]: 7.9 mm echogenic focus within the lower pole the right kidney, suspicious for a possible right renal calculus -But UA: no significant RBC, no Hg. CT renal stone: neg    Deconditioning: will obtain PT/OT eval  -TF SDU to Tele 5/19 if okay with cardiology    Code Status: full Family Communication: d/w patient  (indicate person spoken with, relationship, and if by phone, the number) Disposition Plan: home pend clinical improvement. PT   Consultants:  Cardiology   Procedures:  None   Antibiotics:  Levofloxacin  5/18  Ceftriaxone/aziothromycin 5/18>>>   (indicate start date, and stop date if known)  HPI/Subjective: alert  Objective: Filed Vitals:   05/27/14 0817  BP: 155/70  Pulse: 66  Temp: 99.4 F (37.4 C)  Resp: 16    Intake/Output Summary (Last 24 hours) at 05/27/14 0836 Last data filed at 05/26/14 2330  Gross per 24 hour  Intake 342.36 ml  Output    600 ml  Net -257.64 ml   Filed Weights   05/25/14 2018 05/27/14 0300  Weight: 120.203 kg (265 lb) 120.838 kg (266 lb 6.4 oz)    Exam:   General:  No distress, no nausea  Cardiovascular: s1,s2 rrr  Respiratory: BL: LL crackles   Abdomen: soft, nt,nd   Musculoskeletal: mild leg edema   Data Reviewed: Basic Metabolic Panel:  Recent Labs Lab 05/25/14 2056 05/26/14 0723 05/27/14 0313  NA 136 138 134*  K 3.7 4.0 4.1  CL 96* 96* 93*  CO2 28 33* 31  GLUCOSE 215* 207* 203*  BUN 10 11 18   CREATININE 0.83 0.89 1.12*  CALCIUM 9.5 9.2 9.0   Liver Function Tests:  Recent Labs Lab 05/26/14 0723  AST 18  ALT 17  ALKPHOS 103  BILITOT 0.9  PROT 7.4  ALBUMIN 3.4*   No results for input(s): LIPASE, AMYLASE in the last 168 hours. No results for input(s): AMMONIA in the last 168 hours. CBC:  Recent Labs Lab 05/25/14 2056 05/26/14 0723  WBC 6.7 5.9  NEUTROABS  --  3.1  HGB 15.1* 14.1  HCT 45.4 43.2  MCV 92.7 93.9  PLT  169 177   Cardiac Enzymes:  Recent Labs Lab 05/26/14 0723 05/26/14 1500 05/26/14 1825  TROPONINI <0.03 <0.03 <0.03   BNP (last 3 results)  Recent Labs  05/25/14 2056  BNP 362.5*    ProBNP (last 3 results)  Recent Labs  11/26/13 0441  PROBNP 969.8*    CBG:  Recent Labs Lab 05/26/14 0715 05/26/14 1256 05/26/14 1815 05/26/14 2124  GLUCAP 210* 257* 272* 257*    Recent Results (from the past 240 hour(s))  MRSA PCR Screening     Status: None   Collection Time: 05/26/14 12:54 PM  Result Value Ref Range Status   MRSA by PCR NEGATIVE NEGATIVE Final    Comment:         The GeneXpert MRSA Assay (FDA approved for NASAL specimens only), is one component of a comprehensive MRSA colonization surveillance program. It is not intended to diagnose MRSA infection nor to guide or monitor treatment for MRSA infections.      Studies: Dg Chest 2 View  05/25/2014   ADDENDUM REPORT: 05/25/2014 23:19  ADDENDUM: This addendum is for clarification of the impression.  1. Low lung volumes with increased linear opacities at the right lung base, may reflect atelectasis and hypoventilatory change. Minimal pneumonia could have a similar appearance. The linear opacity in the left mid lung zone has the appearance atelectasis or scarring. 2. Grossly stable cardiomegaly.   Electronically Signed   By: Jeb Levering M.D.   On: 05/25/2014 23:19   05/25/2014   CLINICAL DATA:  Shortness of breath, posterior right and left lower chest pain. Symptoms for 1 week.  EXAM: CHEST  2 VIEW  COMPARISON:  11/26/2013  FINDINGS: Patient is post median sternotomy. Dual lead left-sided pacemaker remains in place. Lower lung volumes from prior exam leading to accentuation of cardiac size and crowding of bronchovascular structures. Cardiomegaly is grossly stable allowing for differences in technique. There are increasing linear opacities at the right lung base. Linear opacity in the left midlung zone, atelectasis or scarring. Mild central vascular congestion without gross pulmonary edema. No pleural effusion or pneumothorax. Degenerative change in the spine without acute osseous abnormality.  IMPRESSION: 1. Low lung volumes with increased linear opacities at the right lung base, may reflect atelectasis and hypoventilatory change. Minimal pneumonia could have a similar appearance. 2. Grossly stable cardiomegaly.  Mild vascular congestion.  Electronically Signed: By: Jeb Levering M.D. On: 05/25/2014 22:59   Ct Angio Chest Pe W/cm &/or Wo Cm  05/26/2014   CLINICAL DATA:  Acute onset of left-sided chest  pain and worsening shortness of breath. Initial encounter.  EXAM: CT ANGIOGRAPHY CHEST WITH CONTRAST  TECHNIQUE: Multidetector CT imaging of the chest was performed using the standard protocol during bolus administration of intravenous contrast. Multiplanar CT image reconstructions and MIPs were obtained to evaluate the vascular anatomy.  CONTRAST:  117mL OMNIPAQUE IOHEXOL 350 MG/ML SOLN  COMPARISON:  Chest radiograph performed 05/25/2014  FINDINGS: There is no evidence of significant pulmonary embolus.  Mild bilateral patchy airspace opacities may reflect atelectasis or possibly mild infection. There is no evidence of pleural effusion or pneumothorax. No masses are identified; no abnormal focal contrast enhancement is seen.  The patient is status post median sternotomy. The patient's pacemaker/AICD is noted at the left chest wall, with associated leads. Diffuse coronary artery calcifications are seen. Scattered calcification is noted along the thoracic aorta. No pericardial effusion is identified. No axillary lymphadenopathy is seen. There is suggestion of a peripherally calcified 2.5 cm hypodensity  at the left thyroid lobe.  The visualized portions of the liver and spleen are unremarkable.  No acute osseous abnormalities are seen. Vacuum phenomenon is noted along the lower thoracic spine.  Review of the MIP images confirms the above findings.  IMPRESSION: 1. No evidence of significant pulmonary embolus. 2. Mild patchy bilateral airspace opacities may reflect atelectasis or possibly mild infection. 3. Diffuse coronary artery calcifications seen. 4. Suggestion of peripherally calcified 2.5 cm hypodense nodule at the left thyroid lobe. Consider further evaluation with thyroid ultrasound. If patient is clinically hyperthyroid, consider nuclear medicine thyroid uptake and scan.   Electronically Signed   By: Garald Balding M.D.   On: 05/26/2014 01:37   US Soft Tissue Head/neck  05/27/2014   CLINICAL DATA:  Thyroid  mass.  EXAM: THYROID ULTRASOUND  TECHNIQUE: Ultrasound examination of the thyroid gland and adjacent soft tissues was performed.  COMPARISON:  CT 04/24/2012.  FINDINGS: Right thyroid lobe  Measurements: 4.9 x 1.6 x 1.4 cm. Three tiny sub cm cysts noted in the right thyroid lobe.  Left thyroid lobe  Measurements: 4.5 x 2.5 x 3.0 cm. 3.7 x 2.8 x 2.7 cm solid mass with calcifications in the left thyroid lobe. Thyroid malignancy cannot be excluded.  Isthmus  Thickness: 0.6 cm.  No nodules visualized.  Lymphadenopathy  None visualized.  IMPRESSION: 3.7 cm solid mass with calcifications in the left thyroid lobe. Findings meet consensus criteria for biopsy. Ultrasound-guided fine needle aspiration should be considered, as per the consensus statement: Management of Thyroid Nodules Detected at [name]: Society of Radiologists in [place]. Radiology 2005; [id number].   Electronically Signed   By: Marcello Moores  Register   On: 05/27/2014 07:25   US Abdomen Complete  05/26/2014   CLINICAL DATA:  Initial evaluation for acute right flank discomfort.  EXAM: ULTRASOUND ABDOMEN COMPLETE  COMPARISON:  Prior CT from 11/30/2003  FINDINGS: Gallbladder: 8.5 mm echogenic non shadowing focus within the gallbladder lumen may reflect stones and/or sludge. No gallbladder wall thickening. No free pericholecystic fluid. No sonographic Murphy sign elicited on exam.  Common bile duct: Diameter: 3.4 mm  Liver: No focal lesion identified. Diffusely increased echogenicity, suggestive of steatosis.  IVC: No abnormality visualized.  Pancreas: Visualized portion unremarkable.  Spleen: Size and appearance within normal limits.  Right Kidney: Length: 10.1 cm. Echogenicity within normal limits. 7.9 mm echogenic focus within the lower pole noted, which may reflect a small stone.  Left Kidney: Length: 11.7 cm. Echogenicity within normal limits. Lower pole not well visualized due to overlying bowel gas. No mass or hydronephrosis  visualized.  Abdominal aorta: No aneurysm visualized.  Other findings: None.  IMPRESSION: 1. 7.9 mm echogenic focus within the lower pole the right kidney, suspicious for a possible right renal calculus. No hydronephrosis. 2. Stones and/or sludge within the gallbladder lumen without sonographic evidence for acute cholecystitis or biliary dilatation. 3. Hepatic steatosis.   Electronically Signed   By: Jeannine Boga M.D.   On: 05/26/2014 04:02   Ct Renal Stone Study  05/26/2014   CLINICAL DATA:  RIGHT flank and back pain, history coronary artery disease post CABG, CHF, diabetes mellitus, ischemic cardiomyopathy  EXAM: CT ABDOMEN AND PELVIS WITHOUT CONTRAST  TECHNIQUE: Multidetector CT imaging of the abdomen and pelvis was performed following the standard protocol without IV contrast. Sagittal and coronal MPR images reconstructed from axial data set. Oral contrast not administered.  COMPARISON:  11/30/2003  FINDINGS: Mild bibasilar atelectasis.  Pacemaker leads RIGHT atrium and RIGHT ventricle.  Enlargement of heart.  Extensive atherosclerotic calcifications without aneurysm.  Excreted contrast material from preceding CT chest of same date opacifies renal collecting systems, ureters and bladder, all limiting assessment for small calculi.  No gross hydronephrosis or ureteral dilatation seen.  Vicarious excretion of contrast material into gallbladder.  Within limits of technique, no focal abnormalities of the liver, spleen, pancreas, kidneys, or adrenal glands.  Stomach and bowel loops grossly normal.  Normal appendix.  Unremarkable bladder, ureters, uterus and LEFT adnexa with small cysts in RIGHT ovary.  No mass, adenopathy, free air, free fluid, or hernia.  Foci subcutaneous gas anterior abdominal wall and LEFT flank question sites of medication injection  Bones demineralized.  IMPRESSION: Scattered atherosclerotic calcification.  No acute intra-abdominal or intrapelvic abnormalities identified.    Electronically Signed   By: Lavonia Dana M.D.   On: 05/26/2014 17:50    Scheduled Meds: . amitriptyline  10 mg Oral QHS  . antiseptic oral rinse  7 mL Mouth Rinse BID  . aspirin EC  325 mg Oral Daily  . atorvastatin  80 mg Oral q1800  . azithromycin  500 mg Intravenous QHS  . carvedilol  25 mg Oral BID WC  . cefTRIAXone (ROCEPHIN)  IV  1 g Intravenous Daily  . enoxaparin (LOVENOX) injection  40 mg Subcutaneous Daily  . furosemide  40 mg Intravenous Q12H  . insulin aspart  0-9 Units Subcutaneous TID WC  . insulin glargine  10 Units Subcutaneous QHS  . latanoprost  1 drop Both Eyes QHS  . lisinopril  10 mg Oral Daily  . phosphorus  500 mg Oral QHS  . spironolactone  25 mg Oral Daily   Continuous Infusions: . nitroGLYCERIN Stopped (05/26/14 1800)    Active Problems:   Ischemic cardiomyopathy   Hypertension   S/P CABG x 5   Chest pain   Right flank pain    Time spent: >35 minutes     Kinnie Feil  [facility] Pager [phone]. If 7PM-7AM, please contact night-coverage at [email or web address], password [facility] 05/27/2014, 8:36 AM  LOS: 1 day

## 2014-05-28 ENCOUNTER — Inpatient Hospital Stay (HOSPITAL_COMMUNITY): Payer: Medicaid Other

## 2014-05-28 DIAGNOSIS — R0782 Intercostal pain: Secondary | ICD-10-CM

## 2014-05-28 DIAGNOSIS — E041 Nontoxic single thyroid nodule: Secondary | ICD-10-CM | POA: Insufficient documentation

## 2014-05-28 DIAGNOSIS — R0602 Shortness of breath: Secondary | ICD-10-CM | POA: Insufficient documentation

## 2014-05-28 LAB — BASIC METABOLIC PANEL
ANION GAP: 10 (ref 5–15)
BUN: 18 mg/dL (ref 6–20)
CALCIUM: 9.2 mg/dL (ref 8.9–10.3)
CHLORIDE: 94 mmol/L — AB (ref 101–111)
CO2: 32 mmol/L (ref 22–32)
Creatinine, Ser: 1.14 mg/dL — ABNORMAL HIGH (ref 0.44–1.00)
GFR calc non Af Amer: 50 mL/min — ABNORMAL LOW (ref >60)
GFR, EST AFRICAN AMERICAN: 58 mL/min — AB (ref >60)
Glucose, Bld: 191 mg/dL — ABNORMAL HIGH (ref 65–99)
Potassium: 4 mmol/L (ref 3.5–5.1)
SODIUM: 136 mmol/L (ref 135–145)

## 2014-05-28 LAB — GLUCOSE, CAPILLARY
GLUCOSE-CAPILLARY: 173 mg/dL — AB (ref 65–99)
GLUCOSE-CAPILLARY: 273 mg/dL — AB (ref 65–99)
Glucose-Capillary: 254 mg/dL — ABNORMAL HIGH (ref 65–99)
Glucose-Capillary: 240 mg/dL — ABNORMAL HIGH (ref 65–99)

## 2014-05-28 LAB — PROCALCITONIN: Procalcitonin: <0.1 ng/mL

## 2014-05-28 MED ORDER — DOXYCYCLINE HYCLATE 100 MG PO TABS
100.0000 mg | ORAL_TABLET | Freq: Once | ORAL | Status: AC
  Administered 2014-05-28: 100 mg via ORAL
  Filled 2014-05-28: qty 1

## 2014-05-28 MED ORDER — INSULIN ASPART 100 UNIT/ML ~~LOC~~ SOLN
0.0000 [IU] | Freq: Three times a day (TID) | SUBCUTANEOUS | Status: DC
  Administered 2014-05-28 (×2): 5 [IU] via SUBCUTANEOUS
  Administered 2014-05-28: 3 [IU] via SUBCUTANEOUS
  Administered 2014-05-29: 8 [IU] via SUBCUTANEOUS
  Administered 2014-05-29: 3 [IU] via SUBCUTANEOUS
  Administered 2014-05-30: 5 [IU] via SUBCUTANEOUS

## 2014-05-28 MED ORDER — INSULIN GLARGINE 100 UNIT/ML ~~LOC~~ SOLN
30.0000 [IU] | Freq: Every day | SUBCUTANEOUS | Status: DC
  Administered 2014-05-29: 30 [IU] via SUBCUTANEOUS
  Filled 2014-05-28 (×3): qty 0.3

## 2014-05-28 MED ORDER — BUDESONIDE 0.25 MG/2ML IN SUSP
0.2500 mg | Freq: Two times a day (BID) | RESPIRATORY_TRACT | Status: DC
  Administered 2014-05-28 – 2014-05-29 (×3): 0.25 mg via RESPIRATORY_TRACT
  Filled 2014-05-28 (×5): qty 2

## 2014-05-28 MED ORDER — LIDOCAINE HCL (PF) 1 % IJ SOLN
INTRAMUSCULAR | Status: AC
  Filled 2014-05-28: qty 10

## 2014-05-28 MED ORDER — DOXYCYCLINE HYCLATE 100 MG PO TABS
100.0000 mg | ORAL_TABLET | Freq: Two times a day (BID) | ORAL | Status: DC
  Administered 2014-05-28 – 2014-05-30 (×5): 100 mg via ORAL
  Filled 2014-05-28 (×6): qty 1

## 2014-05-28 MED ORDER — FUROSEMIDE 80 MG PO TABS
80.0000 mg | ORAL_TABLET | Freq: Two times a day (BID) | ORAL | Status: DC
  Administered 2014-05-28 – 2014-05-29 (×4): 80 mg via ORAL
  Filled 2014-05-28 (×5): qty 1

## 2014-05-28 MED ORDER — ISOSORBIDE MONONITRATE ER 30 MG PO TB24
30.0000 mg | ORAL_TABLET | Freq: Every day | ORAL | Status: DC
  Administered 2014-05-28 – 2014-05-30 (×3): 30 mg via ORAL
  Filled 2014-05-28 (×3): qty 1

## 2014-05-28 NOTE — Progress Notes (Signed)
Patient Name: [name] Date of Encounter: 05/28/2014  Active Problems:   Ischemic cardiomyopathy   Hypertension   S/P CABG x 5   Chest pain   Right flank pain   CAP (community acquired pneumonia)   Length of Stay: 2  SUBJECTIVE  The patient complains of ongoing left breast pain, improved SOB.    CURRENT MEDS . amitriptyline  10 mg Oral QHS  . antiseptic oral rinse  7 mL Mouth Rinse BID  . aspirin EC  325 mg Oral Daily  . atorvastatin  80 mg Oral q1800  . budesonide (PULMICORT) nebulizer solution  0.25 mg Nebulization BID  . carvedilol  25 mg Oral BID WC  . doxycycline  100 mg Oral Q12H  . enoxaparin (LOVENOX) injection  40 mg Subcutaneous Daily  . furosemide  40 mg Intravenous Q12H  . insulin aspart  0-15 Units Subcutaneous TID WC  . insulin glargine  30 Units Subcutaneous QHS  . isosorbide mononitrate  15 mg Oral Daily  . latanoprost  1 drop Both Eyes QHS  . lisinopril  10 mg Oral Daily  . phosphorus  500 mg Oral QHS  . spironolactone  25 mg Oral Daily   . nitroGLYCERIN Stopped (05/26/14 1800)   OBJECTIVE  Filed Vitals:   05/28/14 0336 05/28/14 0400 05/28/14 0500 05/28/14 0743  BP: 123/49   159/65  Pulse:  56 62 67  Temp: 97.8 F (36.6 C)   98.7 F (37.1 C)  TempSrc: Oral   Oral  Resp:  21 20 25   Height:      Weight: 267 lb 4.8 oz (121.246 kg)  267 lb 8 oz (121.337 kg)   SpO2: 95% 96% 95% 95%    Intake/Output Summary (Last 24 hours) at 05/28/14 0752 Last data filed at 05/28/14 0400  Gross per 24 hour  Intake    300 ml  Output   1225 ml  Net   -925 ml   Filed Weights   05/27/14 0300 05/28/14 0336 05/28/14 0500  Weight: 266 lb 6.4 oz (120.838 kg) 267 lb 4.8 oz (121.246 kg) 267 lb 8 oz (121.337 kg)    PHYSICAL EXAM  General: Pleasant, NAD. Neuro: Alert and oriented X 3. Moves all extremities spontaneously. Psych: Normal affect. HEENT:  Normal  Neck: Supple without bruits or JVD. Lungs:  Resp regular and unlabored, CTA. Heart: RRR no  s3, s4, or murmurs. Abdomen: Soft, non-tender, non-distended, BS + x 4.  Extremities: No clubbing, cyanosis or edema. DP/PT/Radials 2+ and equal bilaterally.  Accessory Clinical Findings  CBC  Recent Labs  05/25/14 2056 05/26/14 0723  WBC 6.7 5.9  NEUTROABS  --  3.1  HGB 15.1* 14.1  HCT 45.4 43.2  MCV 92.7 93.9  PLT 169 544   Basic Metabolic Panel  Recent Labs  05/27/14 0313 05/28/14 0300  NA 134* 136  K 4.1 4.0  CL 93* 94*  CO2 31 32  GLUCOSE 203* 191*  BUN 18 18  CREATININE 1.12* 1.14*  CALCIUM 9.0 9.2   Liver Function Tests  Recent Labs  05/26/14 0723  AST 18  ALT 17  ALKPHOS 103  BILITOT 0.9  PROT 7.4  ALBUMIN 3.4*    Recent Labs  05/26/14 0723 05/26/14 1500 05/26/14 1825  TROPONINI <0.03 <0.03 <0.03    Recent Labs  05/27/14 1058  TSH 1.071    Radiology/Studies  Ct Angio Chest Pe W/cm &/or Wo Cm  05/26/2014   CLINICAL DATA:  Acute onset of  left-sided chest pain and worsening shortness of breath. IMPRESSION: 1. No evidence of significant pulmonary embolus. 2. Mild patchy bilateral airspace opacities may reflect atelectasis or possibly mild infection. 3. Diffuse coronary artery calcifications seen. 4. Suggestion of peripherally calcified 2.5 cm hypodense nodule at the left thyroid lobe. Consider further evaluation with thyroid ultrasound. If patient is clinically hyperthyroid, consider nuclear medicine thyroid uptake and scan.     US Soft Tissue Head/neck  05/27/2014   CLINICAL DATA:  Thyroid mass.  EXAM: THYROID ULTRASOUND  TECHNIQUE: Ultrasound examination of the thyroid gland and adjacent soft tissues was performed.  COMPARISON:  CT 04/24/2012.  FINDINGS: Right thyroid lobe  Measurements: 4.9 x 1.6 x 1.4 cm. Three tiny sub cm cysts noted in the right thyroid lobe.  Left thyroid lobe  Measurements: 4.5 x 2.5 x 3.0 cm. 3.7 x 2.8 x 2.7 cm solid mass with calcifications in the left thyroid lobe. Thyroid malignancy cannot be excluded.  Isthmus   Thickness: 0.6 cm.  No nodules visualized.  Lymphadenopathy  None visualized.  IMPRESSION: 3.7 cm solid mass with calcifications in the left thyroid lobe. Findings meet consensus criteria for biopsy. Ultrasound-guided fine needle aspiration should be considered, as per the consensus statement: Management of Thyroid Nodules Detected at [name]: Society of Radiologists in [place]. Radiology 2005; [id number].   Electronically Signed   By: [place]   On: 05/27/2014 07:25   TELE: SR    ASSESSMENT AND PLAN  1. Chest pain : atypical. Troponins are negative despite having this discomfort for the past 3-5 days. Pains are not similar to her previous episodes of angina but did respond to SL NTG. The pain is muskuloskeletal.  Agree with a stress test but she needs to be treated for her pneumonia before the stress test. .   2. Chronic systolic CHF:We will continue iv lasix for 1 moreday.  Chronic LBBB. But was not able to be upgraded to an Bi-V pacer due to coronary sinus anatomy.   3. Pneumonia : She is having lots of wheezing. CXR suggestive of pneumonia vs. CHF. Plans per int. Medicine team .   4. DM - plans per int. Med.   5. Morbid obesity   6. Essential HTN : Cont. Meds.    Signed, Dorothy Spark MD, [facility] 05/28/2014

## 2014-05-28 NOTE — Evaluation (Signed)
Physical Therapy Evaluation Patient Details Name: [name] MRN: [id number] DOB: [birth year] Today's Date: 05/28/2014   History of Present Illness  [name] is a 64 y.o. female with history of CAD status post CABG last cardiac cath in 2011 and stress test in 2013, cardiomyopathy status post AICD placement last EF measured was 30%, OSA, diabetes mellitus type 2, morbid obesity presents to the ER because of chest pain and right flank pain.  Clinical Impression  Pt admitted with above diagnosis. Pt currently with functional limitations due to the deficits listed below (see PT Problem List). Pt ambulated 150' with hand held assist of 1. HR 66, SaO2 95% on 2L O2 [place] with walking. It's expected pt will be able to return home without f/u PT nor DME needs.  Pt will benefit from skilled PT to increase their independence and safety with mobility to allow discharge to the venue listed below.       Follow Up Recommendations No PT follow up    Equipment Recommendations  None recommended by PT    Recommendations for Other Services       Precautions / Restrictions Precautions Precautions: Fall Precaution Comments: pt reports one fall 1 year ago Restrictions Weight Bearing Restrictions: No      Mobility  Bed Mobility Overal bed mobility: Needs Assistance Bed Mobility: Supine to Sit     Supine to sit: Mod assist     General bed mobility comments: assist to raise trunk  Transfers Overall transfer level: Needs assistance Equipment used: 1 person hand held assist Transfers: Sit to/from Stand Sit to Stand: Min assist         General transfer comment: min A to power up  Ambulation/Gait Ambulation/Gait assistance: Min assist Ambulation Distance (Feet): 150 Feet Assistive device: 1 person hand held assist Gait Pattern/deviations: Step-through pattern;Decreased stride length   Gait velocity interpretation: Below normal speed for age/gender General Gait Details: steady with  HHA of 1, SaO2 95% on 2L O2 walking, HR 66  Stairs            Wheelchair Mobility    Modified Rankin (Stroke Patients Only)       Balance Overall balance assessment: Needs assistance   Sitting balance-Leahy Scale: Good       Standing balance-Leahy Scale: Fair Standing balance comment: HHA of 1 for dynamic standing balance                             Pertinent Vitals/Pain Pain Assessment: No/denies pain    Home Living Family/patient expects to be discharged to:: Private residence Living Arrangements: Children Available Help at Discharge: Family;Available PRN/intermittently   Home Access: Stairs to enter   Entrance Stairs-Number of Steps: 3 Home Layout: One level Home Equipment: Clinical cytogeneticist - 2 wheels;Other (comment) (home O2, 2L)      Prior Function Level of Independence: Independent               Hand Dominance        Extremity/Trunk Assessment   Upper Extremity Assessment: Overall WFL for tasks assessed           Lower Extremity Assessment: Overall WFL for tasks assessed      Cervical / Trunk Assessment: Normal  Communication   Communication: No difficulties  Cognition Arousal/Alertness: Lethargic Behavior During Therapy: WFL for tasks assessed/performed Overall Cognitive Status: Within Functional Limits for tasks assessed  General Comments      Exercises        Assessment/Plan    PT Assessment Patient needs continued PT services  PT Diagnosis Generalized weakness   PT Problem List Decreased balance;Decreased activity tolerance;Decreased mobility  PT Treatment Interventions Gait training;Functional mobility training;Therapeutic activities;Patient/family education   PT Goals (Current goals can be found in the Care Plan section) Acute Rehab PT Goals Patient Stated Goal: return home PT Goal Formulation: With patient Time For Goal Achievement: 06/11/14 Potential to Achieve Goals:  Good    Frequency Min 3X/week   Barriers to discharge        Co-evaluation               End of Session Equipment Utilized During Treatment: Gait belt;Oxygen Activity Tolerance: Patient tolerated treatment well Patient left: in chair;with call bell/phone within reach Nurse Communication: Mobility status         Time: 1134-1150 PT Time Calculation (min) (ACUTE ONLY): 16 min   Charges:   PT Evaluation $Initial PT Evaluation Tier I: 1 Procedure     PT G CodesPhilomena Doheny 05/28/2014, 12:47 PM [phone]

## 2014-05-28 NOTE — Progress Notes (Signed)
Iv team was unable to establish iv access using ultrasound. MD informed and order given for picc line which will be inserted in the morning. Will continue to monitor.

## 2014-05-28 NOTE — Procedures (Signed)
Successful (L)thyroid nodule FNA x 4 No complications.  Ascencion Dike PA-C Interventional Radiology 05/28/2014 9:45 AM

## 2014-05-28 NOTE — Progress Notes (Signed)
PT Cancellation Note  Patient Details Name: [name] MRN: [id number] DOB: 1950/05/30   Cancelled Treatment:    Reason Eval/Treat Not Completed: Patient at procedure or test/unavailable (pt is off floor for an ultrasound. Will follow. )   Philomena Doheny 05/28/2014, 9:22 AM [phone]

## 2014-05-28 NOTE — Progress Notes (Signed)
[facility] PROGRESS NOTE  SAPPHIRE TYGART HAL:[id number] DOB: 1950-09-16 DOA: 05/25/2014 PCP: Imelda Pillow, NP  Assessment/Plan: 1. SOB Presumed to be due to BL pneumonia. Seen on CT chest: Mild patchy bilateral airspace opacities. +productive cough.  -will switch to po doxycycline -afebrile, no significant leukocytosis. No significant hypoxia.  -will start pulmicort (as patient having some wheezing) -start flutter valve  2. CP: with hx of CAD and h/o CABG.  -Chest pains, atypical; but with some typical features and risk factors.  -Trop neg X3.  -Defer to cardiology for further evaluation; plan is for myoview stress test on 5/21 3. Acute on chronic CHF mild volume overloaded. Systolic HF. LVEF 73-53%. s/p ICD -Pt fluid overload on exam difficult to assess; but less SOB and with decrease in weight. -will switch lasix to PO -cont spironolactone, ACE, BB 4. IDDM. No recent ha1c. Patient reports taking novolog 100-85-85 with each meal PRN?. reports hypoglycemia at home   -lantus increased to 30U; continue SSI. Adjust as needed -A1C 8.6 5. HTN. Cont diuretics, BB and ACE. BP stable and well controlled 6. Calcified 2.5 cm hypodense nodule at the left thyroid lobe. Thyroid Ultrasound: 3.7 cm solid mass with calcifications in the left thyroid lobe -s/p thyroid biopsy; will follow results -TSH WNL 7. R flank pain. Nephrolithiasis. [name]: 7.9 mm echogenic focus within the lower pole the right kidney, suspicious for a possible right renal calculus -But UA: no significant RBC, no Hg. CT renal stone: neg -will monitor and continue PRN analgesics and supportive care    Deconditioning: will obtain PT/OT eval  -TF to Tele 5/20; myoview in am (5/21)    Code Status: full Family Communication: d/w patient  (indicate person spoken with, relationship, and if by phone, the number) Disposition Plan: home pend clinical improvement. PT  Consultants:  Cardiology   Procedures:  None    Antibiotics:  Levofloxacin 5/18  Ceftriaxone/aziothromycin 5/18>>>   (indicate start date, and stop date if known)  HPI/Subjective: Alert, awake, oriented X3; no CP no fever and reporting improvement in her breathing   Objective: Filed Vitals:   05/28/14 1242  BP:   Pulse: 66  Temp:   Resp:     Intake/Output Summary (Last 24 hours) at 05/28/14 1300 Last data filed at 05/28/14 0900  Gross per 24 hour  Intake    610 ml  Output   1200 ml  Net   -590 ml   Filed Weights   05/27/14 0300 05/28/14 0336 05/28/14 0500  Weight: 120.838 kg (266 lb 6.4 oz) 121.246 kg (267 lb 4.8 oz) 121.337 kg (267 lb 8 oz)    Exam:   General:  No distress, no nausea; reports improvement in her breathing and no CP  Cardiovascular: s1,s2 rrr, no rubs or gallops  Respiratory: decrease breath sounds at bases, no Franck crackles; scattered wheezing  Abdomen: soft, nt,nd   Musculoskeletal: trace LE edema   Data Reviewed: Basic Metabolic Panel:  Recent Labs Lab 05/25/14 2056 05/26/14 0723 05/27/14 0313 05/28/14 0300  NA 136 138 134* 136  K 3.7 4.0 4.1 4.0  CL 96* 96* 93* 94*  CO2 28 33* 31 32  GLUCOSE 215* 207* 203* 191*  BUN 10 11 18 18   CREATININE 0.83 0.89 1.12* 1.14*  CALCIUM 9.5 9.2 9.0 9.2   Liver Function Tests:  Recent Labs Lab 05/26/14 0723  AST 18  ALT 17  ALKPHOS 103  BILITOT 0.9  PROT 7.4  ALBUMIN 3.4*   CBC:  Recent  Labs Lab 05/25/14 2056 05/26/14 0723  WBC 6.7 5.9  NEUTROABS  --  3.1  HGB 15.1* 14.1  HCT 45.4 43.2  MCV 92.7 93.9  PLT 169 177   Cardiac Enzymes:  Recent Labs Lab 05/26/14 0723 05/26/14 1500 05/26/14 1825  TROPONINI <0.03 <0.03 <0.03   BNP (last 3 results)  Recent Labs  05/25/14 2056  BNP 362.5*    ProBNP (last 3 results)  Recent Labs  11/26/13 0441  PROBNP 969.8*    CBG:  Recent Labs Lab 05/26/14 1815 05/26/14 2124 05/27/14 0814 05/27/14 1647 05/27/14 2135  GLUCAP 272* 257* 186* 266* 185*     Recent Results (from the past 240 hour(s))  MRSA PCR Screening     Status: None   Collection Time: 05/26/14 12:54 PM  Result Value Ref Range Status   MRSA by PCR NEGATIVE NEGATIVE Final    Comment:        The GeneXpert MRSA Assay (FDA approved for NASAL specimens only), is one component of a comprehensive MRSA colonization surveillance program. It is not intended to diagnose MRSA infection nor to guide or monitor treatment for MRSA infections.      Studies: US Soft Tissue Head/neck  05/27/2014   CLINICAL DATA:  Thyroid mass.  EXAM: THYROID ULTRASOUND  TECHNIQUE: Ultrasound examination of the thyroid gland and adjacent soft tissues was performed.  COMPARISON:  CT 04/24/2012.  FINDINGS: Right thyroid lobe  Measurements: 4.9 x 1.6 x 1.4 cm. Three tiny sub cm cysts noted in the right thyroid lobe.  Left thyroid lobe  Measurements: 4.5 x 2.5 x 3.0 cm. 3.7 x 2.8 x 2.7 cm solid mass with calcifications in the left thyroid lobe. Thyroid malignancy cannot be excluded.  Isthmus  Thickness: 0.6 cm.  No nodules visualized.  Lymphadenopathy  None visualized.  IMPRESSION: 3.7 cm solid mass with calcifications in the left thyroid lobe. Findings meet consensus criteria for biopsy. Ultrasound-guided fine needle aspiration should be considered, as per the consensus statement: Management of Thyroid Nodules Detected at [name]: Society of Radiologists in [place]. Radiology 2005; [id number].   Electronically Signed   By: Marcello Moores  Register   On: 05/27/2014 07:25   Ct Renal Stone Study  05/26/2014   CLINICAL DATA:  RIGHT flank and back pain, history coronary artery disease post CABG, CHF, diabetes mellitus, ischemic cardiomyopathy  EXAM: CT ABDOMEN AND PELVIS WITHOUT CONTRAST  TECHNIQUE: Multidetector CT imaging of the abdomen and pelvis was performed following the standard protocol without IV contrast. Sagittal and coronal MPR images reconstructed from axial data set. Oral  contrast not administered.  COMPARISON:  11/30/2003  FINDINGS: Mild bibasilar atelectasis.  Pacemaker leads RIGHT atrium and RIGHT ventricle.  Enlargement of heart.  Extensive atherosclerotic calcifications without aneurysm.  Excreted contrast material from preceding CT chest of same date opacifies renal collecting systems, ureters and bladder, all limiting assessment for small calculi.  No gross hydronephrosis or ureteral dilatation seen.  Vicarious excretion of contrast material into gallbladder.  Within limits of technique, no focal abnormalities of the liver, spleen, pancreas, kidneys, or adrenal glands.  Stomach and bowel loops grossly normal.  Normal appendix.  Unremarkable bladder, ureters, uterus and LEFT adnexa with small cysts in RIGHT ovary.  No mass, adenopathy, free air, free fluid, or hernia.  Foci subcutaneous gas anterior abdominal wall and LEFT flank question sites of medication injection  Bones demineralized.  IMPRESSION: Scattered atherosclerotic calcification.  No acute intra-abdominal or intrapelvic abnormalities identified.   Electronically Signed  By: Lavonia Dana M.D.   On: 05/26/2014 17:50    Scheduled Meds: . amitriptyline  10 mg Oral QHS  . antiseptic oral rinse  7 mL Mouth Rinse BID  . aspirin EC  325 mg Oral Daily  . atorvastatin  80 mg Oral q1800  . budesonide (PULMICORT) nebulizer solution  0.25 mg Nebulization BID  . carvedilol  25 mg Oral BID WC  . doxycycline  100 mg Oral Q12H  . enoxaparin (LOVENOX) injection  40 mg Subcutaneous Daily  . furosemide  80 mg Oral BID  . insulin aspart  0-15 Units Subcutaneous TID WC  . insulin glargine  30 Units Subcutaneous QHS  . isosorbide mononitrate  30 mg Oral Daily  . latanoprost  1 drop Both Eyes QHS  . lidocaine (PF)      . lisinopril  10 mg Oral Daily  . phosphorus  500 mg Oral QHS  . spironolactone  25 mg Oral Daily   Continuous Infusions: . nitroGLYCERIN Stopped (05/26/14 1800)    Active Problems:   Ischemic  cardiomyopathy   Hypertension   S/P CABG x 5   Chest pain   Right flank pain   CAP (community acquired pneumonia)    Time spent: 30 minutes     Barton Dubois  [facility] Pager [phone]. If 7PM-7AM, please contact night-coverage at [email or web address], password [facility] 05/28/2014, 1:00 PM  LOS: 2 days

## 2014-05-28 NOTE — Progress Notes (Signed)
Inpatient Diabetes Program Recommendations  AACE/ADA: New Consensus Statement on Inpatient Glycemic Control (2013)  Target Ranges:  Prepandial:   less than 140 mg/dL      Peak postprandial:   less than 180 mg/dL (1-2 hours)      Critically ill patients:  140 - 180 mg/dL   Inpatient Diabetes Program Recommendations Insulin - Meal Coverage: add Novolog 4 units TID for elevated post prandials  Thank you  Raoul Pitch BSN, RN,CDE Inpatient Diabetes Coordinator [phone] (team pager)

## 2014-05-29 ENCOUNTER — Inpatient Hospital Stay (HOSPITAL_COMMUNITY): Payer: Medicaid Other

## 2014-05-29 DIAGNOSIS — E1169 Type 2 diabetes mellitus with other specified complication: Secondary | ICD-10-CM

## 2014-05-29 DIAGNOSIS — R079 Chest pain, unspecified: Secondary | ICD-10-CM

## 2014-05-29 LAB — GLUCOSE, CAPILLARY
GLUCOSE-CAPILLARY: 273 mg/dL — AB (ref 65–99)
Glucose-Capillary: 258 mg/dL — ABNORMAL HIGH (ref 65–99)
Glucose-Capillary: 223 mg/dL — ABNORMAL HIGH (ref 65–99)
Glucose-Capillary: 198 mg/dL — ABNORMAL HIGH (ref 65–99)
Glucose-Capillary: 240 mg/dL — ABNORMAL HIGH (ref 65–99)

## 2014-05-29 MED ORDER — METHOCARBAMOL 500 MG PO TABS: 500.0000 mg | ORAL_TABLET | Freq: Three times a day (TID) | ORAL | Status: DC | PRN

## 2014-05-29 MED ORDER — TECHNETIUM TC 99M SESTAMIBI - CARDIOLITE
30.0000 | Freq: Once | INTRAVENOUS | Status: AC | PRN
  Administered 2014-05-30: 10:00:00 30 via INTRAVENOUS

## 2014-05-29 MED ORDER — REGADENOSON 0.4 MG/5ML IV SOLN
INTRAVENOUS | Status: AC
  Filled 2014-05-29: qty 5

## 2014-05-29 MED ORDER — TECHNETIUM TC 99M SESTAMIBI GENERIC - CARDIOLITE: 30.0000 | Freq: Once | INTRAVENOUS | Status: AC | PRN

## 2014-05-29 NOTE — Progress Notes (Signed)
[facility] PROGRESS NOTE  BRONNIE VASSEUR LEX:[id number] DOB: 08-08-1950 DOA: 05/25/2014 PCP: Imelda Pillow, NP  Assessment/Plan: 1. Acute on chronic resp failure due to BL pneumonia. Seen on CT chest: Mild patchy bilateral airspace opacities. +productive cough.  -will switch to po doxycycline -afebrile, no significant leukocytosis. No significant hypoxia.  -will continue pulmicort (as patient having some wheezing) -continue flutter valve -continue oxygen supplementation (chronically on 2L)  2. CP: with hx of CAD and h/o CABG.  -Chest pains, atypical; but with some typical features and risk factors.  -Trop neg X3.  -following cardiology rec's plan is for myoview stress test today (5/21); is a two days test -will follow results -reports no chest pain today  3. Acute on chronic CHF mild volume overloaded. Systolic HF. LVEF 44-96%. s/p ICD -Pt fluid overload on exam difficult to assess; breathing continue improving and with decrease weight. -will continue current dose of lasix PO -cont spironolactone, ACE, BB  4. IDDM. No recent ha1c. Patient reports taking novolog 100-85-85 with each meal PRN?. reports hypoglycemia at home   -lantus increased to 30 U; continue SSI. Adjust as needed -A1C 8.6 -advise to follow low carbohydrates diet; but to avoid skipping meals  5. HTN. Cont diuretics, BB and ACE. BP stable and well controlled  6. Calcified 2.5 cm hypodense nodule at the left thyroid lobe. Thyroid Ultrasound: 3.7 cm solid mass with calcifications in the left thyroid lobe -s/p thyroid biopsy; will follow results -TSH WNL  7. R flank pain. Nephrolithiasis. [name]: 7.9 mm echogenic focus within the lower pole the right kidney, suspicious for a possible right renal calculus -But UA: no significant RBC, no Hg. CT renal stone: neg -will monitor and continue PRN analgesics and supportive care   -will add robaxin for potential muscle spasm  Deconditioning: will obtain PT/OT eval   -myoview today and tomorrow -will follow results, if neg home on 5/22    Code Status: full Family Communication: d/w patient  (indicate person spoken with, relationship, and if by phone, the number) Disposition Plan: home pend clinical improvement. PT  Consultants:  Cardiology   Procedures:  None   Antibiotics:  Levofloxacin 5/18  Ceftriaxone/aziothromycin 5/18>>>   (indicate start date, and stop date if known)  HPI/Subjective: No fever; reports breathing is improving; no CP today and just mild to moderate flank pain. denies dysuria  Objective: Filed Vitals:   05/29/14 0500  BP: 132/58  Pulse: 62  Temp: 98.3 F (36.8 C)  Resp:     Intake/Output Summary (Last 24 hours) at 05/29/14 1218 Last data filed at 05/28/14 1638  Gross per 24 hour  Intake    240 ml  Output    500 ml  Net   -260 ml   Filed Weights   05/27/14 0300 05/28/14 0336 05/28/14 0500  Weight: 120.838 kg (266 lb 6.4 oz) 121.246 kg (267 lb 4.8 oz) 121.337 kg (267 lb 8 oz)    Exam:   General:  No fever; reports breathing is improving; no CP and just mild to moderate flank pain. denies dysuria  Cardiovascular: s1,s2 rrr, no rubs or gallops  Respiratory: improved air movement, no significant wheezing, no Franck crackles; scattered rhonchi  Abdomen: soft, nt,nd   Musculoskeletal: trace LE edema   Data Reviewed: Basic Metabolic Panel:  Recent Labs Lab 05/25/14 2056 05/26/14 0723 05/27/14 0313 05/28/14 0300  NA 136 138 134* 136  K 3.7 4.0 4.1 4.0  CL 96* 96* 93* 94*  CO2 28 33* 31  32  GLUCOSE 215* 207* 203* 191*  BUN 10 11 18 18   CREATININE 0.83 0.89 1.12* 1.14*  CALCIUM 9.5 9.2 9.0 9.2   Liver Function Tests:  Recent Labs Lab 05/26/14 0723  AST 18  ALT 17  ALKPHOS 103  BILITOT 0.9  PROT 7.4  ALBUMIN 3.4*   CBC:  Recent Labs Lab 05/25/14 2056 05/26/14 0723  WBC 6.7 5.9  NEUTROABS  --  3.1  HGB 15.1* 14.1  HCT 45.4 43.2  MCV 92.7 93.9  PLT 169 177   Cardiac  Enzymes:  Recent Labs Lab 05/26/14 0723 05/26/14 1500 05/26/14 1825  TROPONINI <0.03 <0.03 <0.03   BNP (last 3 results)  Recent Labs  05/25/14 2056  BNP 362.5*    ProBNP (last 3 results)  Recent Labs  11/26/13 0441  PROBNP 969.8*    CBG:  Recent Labs Lab 05/28/14 1120 05/28/14 1634 05/28/14 2114 05/29/14 0740 05/29/14 1112  GLUCAP 254* 240* 273* 223* 198*    Recent Results (from the past 240 hour(s))  MRSA PCR Screening     Status: None   Collection Time: 05/26/14 12:54 PM  Result Value Ref Range Status   MRSA by PCR NEGATIVE NEGATIVE Final    Comment:        The GeneXpert MRSA Assay (FDA approved for NASAL specimens only), is one component of a comprehensive MRSA colonization surveillance program. It is not intended to diagnose MRSA infection nor to guide or monitor treatment for MRSA infections.      Studies: US Thyroid Biopsy  05/28/2014   CLINICAL DATA:  Left thyroid nodule. Request for ultrasound-guided fine-needle aspiration.  EXAM: ULTRASOUND GUIDED NEEDLE ASPIRATE BIOPSY OF THE LEFT THYROID GLAND  COMPARISON:  Thyroid ultrasound performed 05/27/2014  PROCEDURE: Thyroid biopsy was thoroughly discussed with the patient and questions were answered. The benefits, risks, alternatives, and complications were also discussed. The patient understands and wishes to proceed with the procedure. Written consent was obtained.  Ultrasound was performed to localize and mark an adequate site for the biopsy. Left thyroid nodule measures approximately 3.7 x 2.8 x 2.7 cm. Nodule as solid-appearing with calcifications. the patient was then prepped and draped in a normal sterile fashion. Local anesthesia was provided with 1% lidocaine. Using direct ultrasound guidance, 4 passes were made using needles into the nodule within the left lobe of the thyroid. Ultrasound was used to confirm needle placements on all occasions. Specimens were sent to Pathology for analysis.   COMPLICATIONS: None.  FINDINGS: Solid left thyroid nodule as described above  IMPRESSION: Ultrasound guided needle aspirate biopsy performed of the left thyroid nodule.  Read by: Ascencion Dike PA-C   Electronically Signed   By: Marybelle Killings M.D.   On: 05/28/2014 11:21    Scheduled Meds: . amitriptyline  10 mg Oral QHS  . antiseptic oral rinse  7 mL Mouth Rinse BID  . aspirin EC  325 mg Oral Daily  . atorvastatin  80 mg Oral q1800  . budesonide (PULMICORT) nebulizer solution  0.25 mg Nebulization BID  . carvedilol  25 mg Oral BID WC  . doxycycline  100 mg Oral Q12H  . enoxaparin (LOVENOX) injection  40 mg Subcutaneous Daily  . furosemide  80 mg Oral BID  . insulin aspart  0-15 Units Subcutaneous TID WC  . insulin glargine  30 Units Subcutaneous QHS  . isosorbide mononitrate  30 mg Oral Daily  . latanoprost  1 drop Both Eyes QHS  . lisinopril  10 mg Oral  Daily  . phosphorus  500 mg Oral QHS  . spironolactone  25 mg Oral Daily   Continuous Infusions: . nitroGLYCERIN Stopped (05/26/14 1800)    Active Problems:   Ischemic cardiomyopathy   Hypertension   S/P CABG x 5   Chest pain   Right flank pain   CAP (community acquired pneumonia)   SOB (shortness of breath)   Thyroid nodule    Time spent: 30 minutes     Barton Dubois  [facility] Pager [phone]. If 7PM-7AM, please contact night-coverage at [email or web address], password [facility] 05/29/2014, 12:18 PM  LOS: 3 days

## 2014-05-29 NOTE — Progress Notes (Signed)
Today is day 1 of 2 of stress test. Will get resting images only today. Jalaysia Lobb PA-C

## 2014-05-30 ENCOUNTER — Inpatient Hospital Stay (HOSPITAL_COMMUNITY): Payer: Medicaid Other

## 2014-05-30 DIAGNOSIS — R079 Chest pain, unspecified: Secondary | ICD-10-CM | POA: Insufficient documentation

## 2014-05-30 DIAGNOSIS — Z951 Presence of aortocoronary bypass graft: Secondary | ICD-10-CM

## 2014-05-30 DIAGNOSIS — R072 Precordial pain: Secondary | ICD-10-CM

## 2014-05-30 DIAGNOSIS — E785 Hyperlipidemia, unspecified: Secondary | ICD-10-CM

## 2014-05-30 LAB — GLUCOSE, CAPILLARY
GLUCOSE-CAPILLARY: 234 mg/dL — AB (ref 65–99)
Glucose-Capillary: 187 mg/dL — ABNORMAL HIGH (ref 65–99)

## 2014-05-30 LAB — NM MYOCAR MULTI W/SPECT W/WALL MOTION / EF
CHL CUP NUCLEAR SDS: 2
LV dias vol: 268 mL
LV sys vol: 214 mL
Nuc Stress EF: 20 %
RATE: 0.34
SRS: 14
SSS: 16
TID: 1.19

## 2014-05-30 LAB — BASIC METABOLIC PANEL
Anion gap: 12 (ref 5–15)
BUN: 16 mg/dL (ref 6–20)
CO2: 31 mmol/L (ref 22–32)
Calcium: 9.6 mg/dL (ref 8.9–10.3)
Chloride: 93 mmol/L — ABNORMAL LOW (ref 101–111)
Creatinine, Ser: 0.92 mg/dL (ref 0.44–1.00)
GFR calc Af Amer: >60 mL/min (ref >60)
Glucose, Bld: 201 mg/dL — ABNORMAL HIGH (ref 65–99)
POTASSIUM: 4 mmol/L (ref 3.5–5.1)
SODIUM: 136 mmol/L (ref 135–145)

## 2014-05-30 LAB — PROCALCITONIN: Procalcitonin: <0.1 ng/mL

## 2014-05-30 MED ORDER — REGADENOSON 0.4 MG/5ML IV SOLN
INTRAVENOUS | Status: AC
  Administered 2014-05-30: 0.4 mg via INTRAVENOUS
  Filled 2014-05-30: qty 5

## 2014-05-30 MED ORDER — ATORVASTATIN CALCIUM 80 MG PO TABS: 80.0000 mg | ORAL_TABLET | Freq: Every day | ORAL | Status: DC

## 2014-05-30 MED ORDER — REGADENOSON 0.4 MG/5ML IV SOLN
0.4000 mg | Freq: Once | INTRAVENOUS | Status: AC
Start: 2014-05-30 — End: 2014-05-30
  Administered 2014-05-30: 0.4 mg via INTRAVENOUS
  Filled 2014-05-30: qty 5

## 2014-05-30 MED ORDER — ISOSORBIDE DINITRATE 30 MG PO TABS: 30.0000 mg | ORAL_TABLET | Freq: Every day | ORAL | Status: DC

## 2014-05-30 MED ORDER — FUROSEMIDE 80 MG PO TABS: 80.0000 mg | ORAL_TABLET | Freq: Every day | ORAL | Status: DC

## 2014-05-30 MED ORDER — DOXYCYCLINE HYCLATE 100 MG PO TABS: 100.0000 mg | ORAL_TABLET | Freq: Two times a day (BID) | ORAL | Status: DC

## 2014-05-30 MED ORDER — FUROSEMIDE 40 MG PO TABS: 60.0000 mg | ORAL_TABLET | Freq: Two times a day (BID) | ORAL | Status: DC

## 2014-05-30 MED ORDER — TECHNETIUM TC 99M SESTAMIBI GENERIC - CARDIOLITE: 30.0000 | Freq: Once | INTRAVENOUS | Status: DC | PRN

## 2014-05-30 MED ORDER — METHOCARBAMOL 500 MG PO TABS: 500.0000 mg | ORAL_TABLET | Freq: Three times a day (TID) | ORAL | Status: DC | PRN

## 2014-05-30 NOTE — Progress Notes (Signed)
Nuc reviewed by Dr. Stanford Breed. Scar noted, EF <30%, all consistent with prior knowledge of patient's history. No evidence of reversible ischemia. Continue medical therapy per Dr. Stanford Breed.  Keep f/u as planned in 6 weeks with Dr. Marlou Porch. Primary team and patient made aware. Dayna Dunn PA-C

## 2014-05-30 NOTE — Progress Notes (Signed)
Day 2 of 2 nuc completed. Await images. [place] [facility]ology reads on the weekends. Zaylin Runco PA-C

## 2014-05-30 NOTE — Progress Notes (Signed)
Pts assessment unchanged from this am. D/c'd via wheelchair to private vehicle with daughter. I reviewed d/c instructions and pt understood all instructions

## 2014-05-30 NOTE — Progress Notes (Signed)
Patient Name: [name] Date of Encounter: 05/30/2014  Active Problems:   Ischemic cardiomyopathy   Hypertension   S/P CABG x 5   Chest pain   Right flank pain   CAP (community acquired pneumonia)   SOB (shortness of breath)   Thyroid nodule   Length of Stay: 4  SUBJECTIVE  Patient denies dyspnea or chest pain  CURRENT MEDS . amitriptyline  10 mg Oral QHS  . antiseptic oral rinse  7 mL Mouth Rinse BID  . aspirin EC  325 mg Oral Daily  . atorvastatin  80 mg Oral q1800  . budesonide (PULMICORT) nebulizer solution  0.25 mg Nebulization BID  . carvedilol  25 mg Oral BID WC  . doxycycline  100 mg Oral Q12H  . enoxaparin (LOVENOX) injection  40 mg Subcutaneous Daily  . furosemide  80 mg Oral BID  . insulin aspart  0-15 Units Subcutaneous TID WC  . insulin glargine  30 Units Subcutaneous QHS  . isosorbide mononitrate  30 mg Oral Daily  . latanoprost  1 drop Both Eyes QHS  . lisinopril  10 mg Oral Daily  . phosphorus  500 mg Oral QHS  . spironolactone  25 mg Oral Daily   . nitroGLYCERIN Stopped (05/26/14 1800)   OBJECTIVE  Filed Vitals:   05/29/14 1326 05/29/14 2039 05/29/14 2057 05/30/14 0540  BP: 112/46  112/49 133/53  Pulse: 64 60 60 59  Temp: 98.7 F (37.1 C)  98.7 F (37.1 C) 98.4 F (36.9 C)  TempSrc: Oral  Oral Oral  Resp: 21 18 20 20   Height:      Weight:    250 lb 6.4 oz (113.581 kg)  SpO2: 99%  93% 95%    Intake/Output Summary (Last 24 hours) at 05/30/14 0755 Last data filed at 05/29/14 1328  Gross per 24 hour  Intake    240 ml  Output      0 ml  Net    240 ml   Filed Weights   05/28/14 0336 05/28/14 0500 05/30/14 0540  Weight: 267 lb 4.8 oz (121.246 kg) 267 lb 8 oz (121.337 kg) 250 lb 6.4 oz (113.581 kg)    PHYSICAL EXAM  General: Pleasant, obese, NAD. Neuro: Alert and oriented X 3. Moves all extremities spontaneously. HEENT:  Normal  Neck: Supple Lungs:  Mild basilar crackles Heart: RRR Abdomen: Soft, non-tender,  non-distended Extremities: No edema.   Accessory Clinical Findings  Basic Metabolic Panel  Recent Labs  05/28/14 0300 05/30/14 0448  NA 136 136  K 4.0 4.0  CL 94* 93*  CO2 32 31  GLUCOSE 191* 201*  BUN 18 16  CREATININE 1.14* 0.92  CALCIUM 9.2 9.6    Recent Labs  05/27/14 1058  TSH 1.071    Radiology/Studies  Ct Angio Chest Pe W/cm &/or Wo Cm  05/26/2014   CLINICAL DATA:  Acute onset of left-sided chest pain and worsening shortness of breath. IMPRESSION: 1. No evidence of significant pulmonary embolus. 2. Mild patchy bilateral airspace opacities may reflect atelectasis or possibly mild infection. 3. Diffuse coronary artery calcifications seen. 4. Suggestion of peripherally calcified 2.5 cm hypodense nodule at the left thyroid lobe. Consider further evaluation with thyroid ultrasound. If patient is clinically hyperthyroid, consider nuclear medicine thyroid uptake and scan.     US Soft Tissue Head/neck  05/27/2014   CLINICAL DATA:  Thyroid mass.  EXAM: THYROID ULTRASOUND  TECHNIQUE: Ultrasound examination of the thyroid gland and adjacent soft tissues was performed.  COMPARISON:  CT 04/24/2012.  FINDINGS: Right thyroid lobe  Measurements: 4.9 x 1.6 x 1.4 cm. Three tiny sub cm cysts noted in the right thyroid lobe.  Left thyroid lobe  Measurements: 4.5 x 2.5 x 3.0 cm. 3.7 x 2.8 x 2.7 cm solid mass with calcifications in the left thyroid lobe. Thyroid malignancy cannot be excluded.  Isthmus  Thickness: 0.6 cm.  No nodules visualized.  Lymphadenopathy  None visualized.  IMPRESSION: 3.7 cm solid mass with calcifications in the left thyroid lobe. Findings meet consensus criteria for biopsy. Ultrasound-guided fine needle aspiration should be considered, as per the consensus statement: Management of Thyroid Nodules Detected at [name]: Society of Radiologists in [place]. Radiology 2005; [id number].   Electronically Signed   By: [place]   On: 05/27/2014  07:25      ASSESSMENT AND PLAN  1. Chest pain : atypical. Troponins are negative despite having this discomfort for 3-5 days.For day 2 of stress test today. If negative patient can be discharged and FU with Dr Marlou Porch.  2. Chronic systolic FMM:CRFVOHKG ACEI and coreg; change lasix to 80 mg po dialy. Chronic LBBB. But was not able to be upgraded to an Bi-V pacer due to coronary sinus anatomy.   3. Pneumonia : Management per primary care.  4. DM - plans per int. Med.   5. Morbid obesity   6. Essential HTN : Cont. Meds.    Signed, Kirk Ruths MD, [facility] 05/30/2014

## 2014-05-30 NOTE — Discharge Summary (Signed)
Physician Discharge Summary  [name] MAU:[id number] DOB: May 06, 1950 DOA: 05/25/2014  PCP: Imelda Pillow, NP  Admit date: 05/25/2014 Discharge date: 05/30/2014  Time spent: >30 minutes  Recommendations for Outpatient Follow-up:  1. Reassess BP and adjust medications as needed 2. Repeat BMET to follow electrolytes and renal function 3. Please follow closely patient diabetes, A1C 8.6 and patient using just SSI (will benefit of long acting insulin into his regimen)  Discharge Diagnoses:  Active Problems:   Ischemic cardiomyopathy   Hypertension   S/P CABG x 5   Chest pain   Right flank pain   CAP (community acquired pneumonia)   SOB (shortness of breath)   Thyroid nodule   Discharge Condition: stable and improved. Will follow with cardiology on 7/11 and with PCP in 2 weeks.  Diet recommendation: heart healthy and low carbohydrates diet  Filed Weights   05/28/14 0336 05/28/14 0500 05/30/14 0540  Weight: 121.246 kg (267 lb 4.8 oz) 121.337 kg (267 lb 8 oz) 113.581 kg (250 lb 6.4 oz)    History of present illness:  64 y.o. female with history of CAD status post CABG last cardiac cath in 2011 and stress test in 2013, cardiomyopathy status post AICD placement last EF measured was 30%, OSA, diabetes mellitus type 2, morbid obesity; who presented to the ER because of chest pain and right flank pain. Patient states she has been having right flank pain over the last 4 days which has been constant and increases on movement. Denies any trauma or fall. Since yesterday patient started developing left-sided chest pain which increases on morning and on deep palpation. CT angiogram of the chest shows bilateral infiltrates. Patient has been having some productive cough to the point she had thrown up 3 times yesterday, due to post tussive emesis. Denies any fever or chills. Patient was started on empiric antibiotics and given the patient's cardiac history has been admitted for further  management of her chest pain.   Hospital Course: 1. Acute on chronic resp failure due to BL pneumonia. Seen on CT chest: Mild patchy bilateral airspace opacities. +productive cough.  -will discharge on PO doxycycline to complete antibiotic therapy -afebrile, no significant leukocytosis. No significant hypoxia.  -will continue PRN albuterol -continue flutter valve -continue oxygen supplementation (chronically on 2L) -follow up with PCP in 2 weeks  2. CP: with hx of CAD and h/o CABG.  -Chest pains, atypical; but with some typical features and risk factors.  -Trop neg X3.  -following cardiology rec's plan is for medical therapy -had Myoview stress test on (5/21): and was unchanged from prior study -reports no chest pain today -will follow with Dr. Marlou Porch as instructed -patient imdur and lipitor adjusted  3. Acute on chronic CHF mild volume overloaded. Systolic HF. LVEF 56-38%. s/p ICD -Pt fluid overload on exam difficult to assess; but breathing continue improving and her weight is down. -no JVD on exam -will discharge on lasix 60mg  BID -cont spironolactone, ACE, BB  4. IDDM. Uncontrolled.  -will continue home hypoglycemic regimen and have patient follow with PCP for further medication adjustments -A1C 8.6 -advise to follow low carbohydrates diet; and to avoid skipping meals  5. HTN. Cont diuretics, BB and ACE. BP stable and well controlled  6. Calcified 2.5 cm hypodense nodule at the left thyroid lobe. Thyroid Ultrasound: 3.7 cm solid mass with calcifications in the left thyroid lobe -s/p thyroid biopsy; will follow results -TSH WNL  7. R flank pain. Nephrolithiasis. [name]: 7.9 mm echogenic focus  within the lower pole the right kidney, suspicious for a possible right renal calculus -But UA: no significant RBC, no Hg. CT renal stone: neg -will monitor and continue PRN analgesics and supportive care  -will add robaxin for potential muscle spasm  8. Deconditioning: no needs  per PT/OT evaluation -will discharge home.   Procedures:  Myoview:  1. Findings consistent with prior myocardial infarction. 2. Scar noted, EF <30%, all consistent with prior knowledge of patient's history.  3. No evidence of reversible ischemia.   Consultations:  Cardiology   Discharge Exam: Filed Vitals:   05/30/14 1349  BP: 106/42  Pulse: 62  Temp: 98.6 F (37 C)  Resp: 22    General: No fever; reports breathing much improved/back to her baseline; no CP and just mild to moderate flank pain. Denies dysuria, nausea, vomiting and abd pain.  Cardiovascular: s1,s2 rrr, no rubs or gallops  Respiratory: improved air movement, no significant wheezing, no Franck crackles; scattered rhonchi  Abdomen: soft, nt,nd   Musculoskeletal: trace LE edema  Discharge Instructions   Discharge Instructions    Diet - low sodium heart healthy    Complete by:  As directed      Discharge instructions    Complete by:  As directed   Arrange follow up with PCP in 2 weeks Take medications as prescribed Follow a low sodium diet Check weight on daily basis Noticed that your imdur, lipitor and lasix dose has been adjusted          Current Discharge Medication List    START taking these medications   Details  doxycycline (VIBRA-TABS) 100 MG tablet Take 1 tablet (100 mg total) by mouth every 12 (twelve) hours. Qty: 6 tablet, Refills: 0    methocarbamol (ROBAXIN) 500 MG tablet Take 1 tablet (500 mg total) by mouth every 8 (eight) hours as needed for muscle spasms. Qty: 45 tablet, Refills: 0      CONTINUE these medications which have CHANGED   Details  atorvastatin (LIPITOR) 80 MG tablet Take 1 tablet (80 mg total) by mouth daily at 6 PM. Qty: 30 tablet, Refills: 1    furosemide (LASIX) 40 MG tablet Take 1.5 tablets (60 mg total) by mouth 2 (two) times daily. Qty: 90 tablet, Refills: 1    isosorbide dinitrate (ISORDIL) 30 MG tablet Take 1 tablet (30 mg total) by mouth  daily. Qty: 30 tablet, Refills: 1      CONTINUE these medications which have NOT CHANGED   Details  albuterol (PROVENTIL HFA;VENTOLIN HFA) 108 (90 BASE) MCG/ACT inhaler Inhale 2 puffs into the lungs every 6 (six) hours as needed for wheezing or shortness of breath.    amitriptyline (ELAVIL) 10 MG tablet Take 10 mg by mouth at bedtime.    aspirin EC 81 MG EC tablet Take 1 tablet (81 mg total) by mouth daily. Qty: 30 tablet, Refills: 12    carvedilol (COREG) 25 MG tablet Take 1 tablet (25 mg total) by mouth 2 (two) times daily with a meal. Qty: 60 tablet, Refills: 3    insulin lispro (HUMALOG) 100 UNIT/ML injection Inject 85-100 Units into the skin 2 (two) times daily. Use 50 units in the morning and 85 units at lunch    Iron-Vitamin C (IRON 100/C PO) Take 1 tablet by mouth every other day.     latanoprost (XALATAN) 0.005 % ophthalmic solution Place 1 drop into both eyes at bedtime.    lisinopril (PRINIVIL,ZESTRIL) 10 MG tablet Take 1 tablet (10 mg total)  by mouth daily. Qty: 30 tablet, Refills: 3    potassium phosphate, monobasic, (K-PHOS ORIGINAL) 500 MG tablet Take 500 mg by mouth at bedtime.     spironolactone (ALDACTONE) 25 MG tablet Take 1 tablet (25 mg total) by mouth daily. Qty: 30 tablet, Refills: 3       Allergies  Allergen Reactions  . Metformin And Related Itching, Swelling and Other (See Comments)    Leg pain & swelling in legs  . Levofloxacin Itching   Follow-up Information    Follow up with Candee Furbish, MD.   Specialty:  Cardiology   Why:  07/19/14 at 10:45am   Contact information:   [address] [place] [address] [phone]       Follow up with Imelda Pillow, NP. Schedule an appointment as soon as possible for a visit in 2 weeks.   Contact information:   [facility] [place] [address] [phone]       The results of significant diagnostics from this hospitalization (including  imaging, microbiology, ancillary and laboratory) are listed below for reference.    Significant Diagnostic Studies: Dg Chest 2 View  05/25/2014   ADDENDUM REPORT: 05/25/2014 23:19  ADDENDUM: This addendum is for clarification of the impression.  1. Low lung volumes with increased linear opacities at the right lung base, may reflect atelectasis and hypoventilatory change. Minimal pneumonia could have a similar appearance. The linear opacity in the left mid lung zone has the appearance atelectasis or scarring. 2. Grossly stable cardiomegaly.   Electronically Signed   By: Jeb Levering M.D.   On: 05/25/2014 23:19   05/25/2014   CLINICAL DATA:  Shortness of breath, posterior right and left lower chest pain. Symptoms for 1 week.  EXAM: CHEST  2 VIEW  COMPARISON:  11/26/2013  FINDINGS: Patient is post median sternotomy. Dual lead left-sided pacemaker remains in place. Lower lung volumes from prior exam leading to accentuation of cardiac size and crowding of bronchovascular structures. Cardiomegaly is grossly stable allowing for differences in technique. There are increasing linear opacities at the right lung base. Linear opacity in the left midlung zone, atelectasis or scarring. Mild central vascular congestion without gross pulmonary edema. No pleural effusion or pneumothorax. Degenerative change in the spine without acute osseous abnormality.  IMPRESSION: 1. Low lung volumes with increased linear opacities at the right lung base, may reflect atelectasis and hypoventilatory change. Minimal pneumonia could have a similar appearance. 2. Grossly stable cardiomegaly.  Mild vascular congestion.  Electronically Signed: By: Jeb Levering M.D. On: 05/25/2014 22:59   Ct Angio Chest Pe W/cm &/or Wo Cm  05/26/2014   CLINICAL DATA:  Acute onset of left-sided chest pain and worsening shortness of breath. Initial encounter.  EXAM: CT ANGIOGRAPHY CHEST WITH CONTRAST  TECHNIQUE: Multidetector CT imaging of the chest was  performed using the standard protocol during bolus administration of intravenous contrast. Multiplanar CT image reconstructions and MIPs were obtained to evaluate the vascular anatomy.  CONTRAST:  122mL OMNIPAQUE IOHEXOL 350 MG/ML SOLN  COMPARISON:  Chest radiograph performed 05/25/2014  FINDINGS: There is no evidence of significant pulmonary embolus.  Mild bilateral patchy airspace opacities may reflect atelectasis or possibly mild infection. There is no evidence of pleural effusion or pneumothorax. No masses are identified; no abnormal focal contrast enhancement is seen.  The patient is status post median sternotomy. The patient's pacemaker/AICD is noted at the left chest wall, with associated leads. Diffuse coronary artery calcifications  are seen. Scattered calcification is noted along the thoracic aorta. No pericardial effusion is identified. No axillary lymphadenopathy is seen. There is suggestion of a peripherally calcified 2.5 cm hypodensity at the left thyroid lobe.  The visualized portions of the liver and spleen are unremarkable.  No acute osseous abnormalities are seen. Vacuum phenomenon is noted along the lower thoracic spine.  Review of the MIP images confirms the above findings.  IMPRESSION: 1. No evidence of significant pulmonary embolus. 2. Mild patchy bilateral airspace opacities may reflect atelectasis or possibly mild infection. 3. Diffuse coronary artery calcifications seen. 4. Suggestion of peripherally calcified 2.5 cm hypodense nodule at the left thyroid lobe. Consider further evaluation with thyroid ultrasound. If patient is clinically hyperthyroid, consider nuclear medicine thyroid uptake and scan.   Electronically Signed   By: Garald Balding M.D.   On: 05/26/2014 01:37   US Soft Tissue Head/neck  05/27/2014   CLINICAL DATA:  Thyroid mass.  EXAM: THYROID ULTRASOUND  TECHNIQUE: Ultrasound examination of the thyroid gland and adjacent soft tissues was performed.  COMPARISON:  CT 04/24/2012.   FINDINGS: Right thyroid lobe  Measurements: 4.9 x 1.6 x 1.4 cm. Three tiny sub cm cysts noted in the right thyroid lobe.  Left thyroid lobe  Measurements: 4.5 x 2.5 x 3.0 cm. 3.7 x 2.8 x 2.7 cm solid mass with calcifications in the left thyroid lobe. Thyroid malignancy cannot be excluded.  Isthmus  Thickness: 0.6 cm.  No nodules visualized.  Lymphadenopathy  None visualized.  IMPRESSION: 3.7 cm solid mass with calcifications in the left thyroid lobe. Findings meet consensus criteria for biopsy. Ultrasound-guided fine needle aspiration should be considered, as per the consensus statement: Management of Thyroid Nodules Detected at [name]: Society of Radiologists in [place]. Radiology 2005; [id number].   Electronically Signed   By: Marcello Moores  Register   On: 05/27/2014 07:25   US Abdomen Complete  05/26/2014   CLINICAL DATA:  Initial evaluation for acute right flank discomfort.  EXAM: ULTRASOUND ABDOMEN COMPLETE  COMPARISON:  Prior CT from 11/30/2003  FINDINGS: Gallbladder: 8.5 mm echogenic non shadowing focus within the gallbladder lumen may reflect stones and/or sludge. No gallbladder wall thickening. No free pericholecystic fluid. No sonographic Murphy sign elicited on exam.  Common bile duct: Diameter: 3.4 mm  Liver: No focal lesion identified. Diffusely increased echogenicity, suggestive of steatosis.  IVC: No abnormality visualized.  Pancreas: Visualized portion unremarkable.  Spleen: Size and appearance within normal limits.  Right Kidney: Length: 10.1 cm. Echogenicity within normal limits. 7.9 mm echogenic focus within the lower pole noted, which may reflect a small stone.  Left Kidney: Length: 11.7 cm. Echogenicity within normal limits. Lower pole not well visualized due to overlying bowel gas. No mass or hydronephrosis visualized.  Abdominal aorta: No aneurysm visualized.  Other findings: None.  IMPRESSION: 1. 7.9 mm echogenic focus within the lower pole the right kidney,  suspicious for a possible right renal calculus. No hydronephrosis. 2. Stones and/or sludge within the gallbladder lumen without sonographic evidence for acute cholecystitis or biliary dilatation. 3. Hepatic steatosis.   Electronically Signed   By: Jeannine Boga M.D.   On: 05/26/2014 04:02   Nm Myocar Multi W/spect W/wall Motion / Ef  05/30/2014    Findings consistent with prior myocardial infarction.  This is a high risk study.  The left ventricular ejection fraction is severely decreased (<30%).  Defect 1: There is a large defect of severe severity present in the mid  inferoseptal and apex  location.    US Thyroid Biopsy  05/28/2014   CLINICAL DATA:  Left thyroid nodule. Request for ultrasound-guided fine-needle aspiration.  EXAM: ULTRASOUND GUIDED NEEDLE ASPIRATE BIOPSY OF THE LEFT THYROID GLAND  COMPARISON:  Thyroid ultrasound performed 05/27/2014  PROCEDURE: Thyroid biopsy was thoroughly discussed with the patient and questions were answered. The benefits, risks, alternatives, and complications were also discussed. The patient understands and wishes to proceed with the procedure. Written consent was obtained.  Ultrasound was performed to localize and mark an adequate site for the biopsy. Left thyroid nodule measures approximately 3.7 x 2.8 x 2.7 cm. Nodule as solid-appearing with calcifications. the patient was then prepped and draped in a normal sterile fashion. Local anesthesia was provided with 1% lidocaine. Using direct ultrasound guidance, 4 passes were made using needles into the nodule within the left lobe of the thyroid. Ultrasound was used to confirm needle placements on all occasions. Specimens were sent to Pathology for analysis.  COMPLICATIONS: None.  FINDINGS: Solid left thyroid nodule as described above  IMPRESSION: Ultrasound guided needle aspirate biopsy performed of the left thyroid nodule.  Read by: Ascencion Dike PA-C   Electronically Signed   By: Marybelle Killings M.D.   On:  05/28/2014 11:21   Ct Renal Stone Study  05/26/2014   CLINICAL DATA:  RIGHT flank and back pain, history coronary artery disease post CABG, CHF, diabetes mellitus, ischemic cardiomyopathy  EXAM: CT ABDOMEN AND PELVIS WITHOUT CONTRAST  TECHNIQUE: Multidetector CT imaging of the abdomen and pelvis was performed following the standard protocol without IV contrast. Sagittal and coronal MPR images reconstructed from axial data set. Oral contrast not administered.  COMPARISON:  11/30/2003  FINDINGS: Mild bibasilar atelectasis.  Pacemaker leads RIGHT atrium and RIGHT ventricle.  Enlargement of heart.  Extensive atherosclerotic calcifications without aneurysm.  Excreted contrast material from preceding CT chest of same date opacifies renal collecting systems, ureters and bladder, all limiting assessment for small calculi.  No gross hydronephrosis or ureteral dilatation seen.  Vicarious excretion of contrast material into gallbladder.  Within limits of technique, no focal abnormalities of the liver, spleen, pancreas, kidneys, or adrenal glands.  Stomach and bowel loops grossly normal.  Normal appendix.  Unremarkable bladder, ureters, uterus and LEFT adnexa with small cysts in RIGHT ovary.  No mass, adenopathy, free air, free fluid, or hernia.  Foci subcutaneous gas anterior abdominal wall and LEFT flank question sites of medication injection  Bones demineralized.  IMPRESSION: Scattered atherosclerotic calcification.  No acute intra-abdominal or intrapelvic abnormalities identified.   Electronically Signed   By: Lavonia Dana M.D.   On: 05/26/2014 17:50    Microbiology: Recent Results (from the past 240 hour(s))  MRSA PCR Screening     Status: None   Collection Time: 05/26/14 12:54 PM  Result Value Ref Range Status   MRSA by PCR NEGATIVE NEGATIVE Final    Comment:        The GeneXpert MRSA Assay (FDA approved for NASAL specimens only), is one component of a comprehensive MRSA colonization surveillance program.  It is not intended to diagnose MRSA infection nor to guide or monitor treatment for MRSA infections.      Labs: Basic Metabolic Panel:  Recent Labs Lab 05/25/14 2056 05/26/14 0723 05/27/14 0313 05/28/14 0300 05/30/14 0448  NA 136 138 134* 136 136  K 3.7 4.0 4.1 4.0 4.0  CL 96* 96* 93* 94* 93*  CO2 28 33* 31 32 31  GLUCOSE 215* 207* 203* 191* 201*  BUN 10 11  18 18 16   CREATININE 0.83 0.89 1.12* 1.14* 0.92  CALCIUM 9.5 9.2 9.0 9.2 9.6   Liver Function Tests:  Recent Labs Lab 05/26/14 0723  AST 18  ALT 17  ALKPHOS 103  BILITOT 0.9  PROT 7.4  ALBUMIN 3.4*   CBC:  Recent Labs Lab 05/25/14 2056 05/26/14 0723  WBC 6.7 5.9  NEUTROABS  --  3.1  HGB 15.1* 14.1  HCT 45.4 43.2  MCV 92.7 93.9  PLT 169 177   Cardiac Enzymes:  Recent Labs Lab 05/26/14 0723 05/26/14 1500 05/26/14 1825  TROPONINI <0.03 <0.03 <0.03   BNP: BNP (last 3 results)  Recent Labs  05/25/14 2056  BNP 362.5*    ProBNP (last 3 results)  Recent Labs  11/26/13 0441  PROBNP 969.8*    CBG:  Recent Labs Lab 05/29/14 1112 05/29/14 1623 05/29/14 2114 05/30/14 0718 05/30/14 1120  GLUCAP 198* 273* 240* 187* 234*    Signed:  Barton Dubois  [facility] 05/30/2014, 3:22 PM

## 2014-06-04 ENCOUNTER — Encounter (HOSPITAL_COMMUNITY): Payer: Self-pay

## 2014-06-04 ENCOUNTER — Emergency Department (HOSPITAL_COMMUNITY): Payer: Medicaid Other

## 2014-06-04 ENCOUNTER — Emergency Department (HOSPITAL_COMMUNITY)
Admission: EM | Admit: 2014-06-04 | Discharge: 2014-06-04 | Disposition: A | Discharge status: Home / Self Care | Payer: Medicaid Other | Attending: Emergency Medicine | Admitting: Emergency Medicine

## 2014-06-04 DIAGNOSIS — Z79899 Other long term (current) drug therapy: Secondary | ICD-10-CM | POA: Diagnosis not present

## 2014-06-04 DIAGNOSIS — Z95810 Presence of automatic (implantable) cardiac defibrillator: Secondary | ICD-10-CM | POA: Insufficient documentation

## 2014-06-04 DIAGNOSIS — E119 Type 2 diabetes mellitus without complications: Secondary | ICD-10-CM | POA: Diagnosis not present

## 2014-06-04 DIAGNOSIS — M7989 Other specified soft tissue disorders: Secondary | ICD-10-CM | POA: Diagnosis not present

## 2014-06-04 DIAGNOSIS — Z951 Presence of aortocoronary bypass graft: Secondary | ICD-10-CM | POA: Diagnosis not present

## 2014-06-04 DIAGNOSIS — G4730 Sleep apnea, unspecified: Secondary | ICD-10-CM | POA: Diagnosis not present

## 2014-06-04 DIAGNOSIS — Z794 Long term (current) use of insulin: Secondary | ICD-10-CM | POA: Insufficient documentation

## 2014-06-04 DIAGNOSIS — I10 Essential (primary) hypertension: Secondary | ICD-10-CM | POA: Diagnosis not present

## 2014-06-04 DIAGNOSIS — R5381 Other malaise: Secondary | ICD-10-CM | POA: Insufficient documentation

## 2014-06-04 DIAGNOSIS — Z9981 Dependence on supplemental oxygen: Secondary | ICD-10-CM | POA: Diagnosis not present

## 2014-06-04 DIAGNOSIS — I5042 Chronic combined systolic (congestive) and diastolic (congestive) heart failure: Secondary | ICD-10-CM | POA: Diagnosis not present

## 2014-06-04 DIAGNOSIS — I25119 Atherosclerotic heart disease of native coronary artery with unspecified angina pectoris: Secondary | ICD-10-CM | POA: Insufficient documentation

## 2014-06-04 DIAGNOSIS — R0600 Dyspnea, unspecified: Secondary | ICD-10-CM | POA: Insufficient documentation

## 2014-06-04 DIAGNOSIS — M545 Low back pain, unspecified: Secondary | ICD-10-CM

## 2014-06-04 DIAGNOSIS — R0609 Other forms of dyspnea: Secondary | ICD-10-CM

## 2014-06-04 DIAGNOSIS — Z9889 Other specified postprocedural states: Secondary | ICD-10-CM | POA: Insufficient documentation

## 2014-06-04 DIAGNOSIS — Z7982 Long term (current) use of aspirin: Secondary | ICD-10-CM | POA: Insufficient documentation

## 2014-06-04 LAB — CBC
HCT: 41.3 % (ref 36.0–46.0)
HEMOGLOBIN: 13.5 g/dL (ref 12.0–15.0)
MCH: 30.3 pg (ref 26.0–34.0)
MCHC: 32.7 g/dL (ref 30.0–36.0)
MCV: 92.6 fL (ref 78.0–100.0)
PLATELETS: 196 10*3/uL (ref 150–400)
RBC: 4.46 MIL/uL (ref 3.87–5.11)
RDW: 14.6 % (ref 11.5–15.5)
WBC: 7 10*3/uL (ref 4.0–10.5)

## 2014-06-04 LAB — BRAIN NATRIURETIC PEPTIDE: B NATRIURETIC PEPTIDE 5: 141.6 pg/mL — AB (ref 0.0–100.0)

## 2014-06-04 LAB — BASIC METABOLIC PANEL
ANION GAP: 9 (ref 5–15)
BUN: 17 mg/dL (ref 6–20)
CALCIUM: 9 mg/dL (ref 8.9–10.3)
CHLORIDE: 95 mmol/L — AB (ref 101–111)
CO2: 28 mmol/L (ref 22–32)
Creatinine, Ser: 0.99 mg/dL (ref 0.44–1.00)
GFR calc Af Amer: >60 mL/min (ref >60)
GFR calc non Af Amer: 59 mL/min — ABNORMAL LOW (ref >60)
GLUCOSE: 262 mg/dL — AB (ref 65–99)
Potassium: 4 mmol/L (ref 3.5–5.1)
SODIUM: 132 mmol/L — AB (ref 135–145)

## 2014-06-04 LAB — I-STAT TROPONIN, ED: Troponin i, poc: 0.03 ng/mL (ref 0.00–0.08)

## 2014-06-04 MED ORDER — FENTANYL CITRATE (PF) 100 MCG/2ML IJ SOLN
50.0000 ug | Freq: Once | INTRAMUSCULAR | Status: AC
  Administered 2014-06-04: 50 ug via INTRAVENOUS
  Filled 2014-06-04: qty 2

## 2014-06-04 MED ORDER — HYDROCODONE-ACETAMINOPHEN 5-325 MG PO TABS
1.0000 | ORAL_TABLET | Freq: Four times a day (QID) | ORAL | Status: DC | PRN
Start: 2014-06-04 — End: 2015-09-30

## 2014-06-04 MED ORDER — FUROSEMIDE 10 MG/ML IJ SOLN
80.0000 mg | Freq: Once | INTRAMUSCULAR | Status: AC
  Administered 2014-06-04: 80 mg via INTRAVENOUS
  Filled 2014-06-04: qty 8

## 2014-06-04 NOTE — ED Notes (Signed)
Pt complains of back pain and shortness of breath, pt recently admitted for chf and pneumonia.

## 2014-06-04 NOTE — ED Notes (Signed)
Assisted pt to bedside commode.

## 2014-06-04 NOTE — Progress Notes (Signed)
LCSW was notified patient is in need of home health services. Patient does not qualify for [facility] at this time due to payor only being Medicaid. Patient will also not qualify for PT services at home due to [facility] being payor. She will qualify for RN and SW.  Patient was referred to [facility] as they are the ones who manage her O2 tank. Patient will need tank to transport home and daughter in room to provide transportation. Patient not agreeable for placement. She will return home with her son.  RN called to have [facility] order for Home health and face to face.  Information given to daughter about private duty sitters.  Lane Hacker, MSW Clinical Social Work: Emergency Room [phone]

## 2014-06-04 NOTE — ED Notes (Signed)
Pt ambulated down the hall and back and o2 sat remained above 90% on RA. HR steady at 78bpm.

## 2014-06-04 NOTE — Discharge Instructions (Signed)
Call and make an appointment to follow-up with your primary physician. You may need to have home health or physical therapy arranged as an outpatient. Take pain medication as prescribed. Return immediately for worsening shortness of breath, chest pain, fever or for any concerns.  Shortness of Breath Shortness of breath means you have trouble breathing. It could also mean that you have a medical problem. You should get immediate medical care for shortness of breath. CAUSES  Not enough oxygen in the air such as with high altitudes or a smoke-filled room. Certain lung diseases, infections, or problems. Heart disease or conditions, such as angina or heart failure. Low red blood cells (anemia). Poor physical fitness, which can cause shortness of breath when you exercise. Chest or back injuries or stiffness. Being overweight. Smoking. Anxiety, which can make you feel like you are not getting enough air. DIAGNOSIS  Serious medical problems can often be found during your physical exam. Tests may also be done to determine why you are having shortness of breath. Tests may include: Chest X-rays. Lung function tests. Blood tests. An electrocardiogram (ECG). An ambulatory electrocardiogram. An ambulatory ECG records your heartbeat patterns over a 24-hour period. Exercise testing. A transthoracic echocardiogram (TTE). During echocardiography, sound waves are used to evaluate how blood flows through your heart. A transesophageal echocardiogram (TEE). Imaging scans. Your health care provider may not be able to find a cause for your shortness of breath after your exam. In this case, it is important to have a follow-up exam with your health care provider as directed.  TREATMENT  Treatment for shortness of breath depends on the cause of your symptoms and can vary greatly. HOME CARE INSTRUCTIONS  Do not smoke. Smoking is a common cause of shortness of breath. If you smoke, ask for help to quit. Avoid being  around chemicals or things that may bother your breathing, such as paint fumes and dust. Rest as needed. Slowly resume your usual activities. If medicines were prescribed, take them as directed for the full length of time directed. This includes oxygen and any inhaled medicines. Keep all follow-up appointments as directed by your health care provider. SEEK MEDICAL CARE IF:  Your condition does not improve in the time expected. You have a hard time doing your normal activities even with rest. You have any new symptoms. SEEK IMMEDIATE MEDICAL CARE IF:  Your shortness of breath gets worse. You feel light-headed, faint, or develop a cough not controlled with medicines. You start coughing up blood. You have pain with breathing. You have chest pain or pain in your arms, shoulders, or abdomen. You have a fever. You are unable to walk up stairs or exercise the way you normally do. MAKE SURE YOU: Understand these instructions. Will watch your condition. Will get help right away if you are not doing well or get worse. Document Released: 09/19/2000 Document Revised: 12/30/2012 Document Reviewed: 03/12/2011 [facility] Patient Information 2015 [place], [place]. This information is not intended to replace advice given to you by your health care provider. Make sure you discuss any questions you have with your health care provider. Back Pain, Adult Low back pain is very common. About 1 in 5 people have back pain.The cause of low back pain is rarely dangerous. The pain often gets better over time.About half of people with a sudden onset of back pain feel better in just 2 weeks. About 8 in 10 people feel better by 6 weeks.  CAUSES Some common causes of back pain include:  Strain  of the muscles or ligaments supporting the spine.  Wear and tear (degeneration) of the spinal discs.  Arthritis.  Direct injury to the back. DIAGNOSIS Most of the time, the direct cause of low back pain is not known.However,  back pain can be treated effectively even when the exact cause of the pain is unknown.Answering your caregiver's questions about your overall health and symptoms is one of the most accurate ways to make sure the cause of your pain is not dangerous. If your caregiver needs more information, he or she may order lab work or imaging tests (X-rays or MRIs).However, even if imaging tests show changes in your back, this usually does not require surgery. HOME CARE INSTRUCTIONS For many people, back pain returns.Since low back pain is rarely dangerous, it is often a condition that people can learn to [facility] their own.   Remain active. It is stressful on the back to sit or stand in one place. Do not sit, drive, or stand in one place for more than 30 minutes at a time. Take short walks on level surfaces as soon as pain allows.Try to increase the length of time you walk each day.  Do not stay in bed.Resting more than 1 or 2 days can delay your recovery.  Do not avoid exercise or work.Your body is made to move.It is not dangerous to be active, even though your back may hurt.Your back will likely heal faster if you return to being active before your pain is gone.  Pay attention to your body when you bend and lift. Many people have less discomfortwhen lifting if they bend their knees, keep the load close to their bodies,and avoid twisting. Often, the most comfortable positions are those that put less stress on your recovering back.  Find a comfortable position to sleep. Use a firm mattress and lie on your side with your knees slightly bent. If you lie on your back, put a pillow under your knees.  Only take over-the-counter or prescription medicines as directed by your caregiver. Over-the-counter medicines to reduce pain and inflammation are often the most helpful.Your caregiver may prescribe muscle relaxant drugs.These medicines help dull your pain so you can more quickly return to your normal  activities and healthy exercise.  Put ice on the injured area.  Put ice in a plastic bag.  Place a towel between your skin and the bag.  Leave the ice on for 15-20 minutes, 03-04 times a day for the first 2 to 3 days. After that, ice and heat may be alternated to reduce pain and spasms.  Ask your caregiver about trying back exercises and gentle massage. This may be of some benefit.  Avoid feeling anxious or stressed.Stress increases muscle tension and can worsen back pain.It is important to recognize when you are anxious or stressed and learn ways to manage it.Exercise is a great option. SEEK MEDICAL CARE IF:  You have pain that is not relieved with rest or medicine.  You have pain that does not improve in 1 week.  You have new symptoms.  You are generally not feeling well. SEEK IMMEDIATE MEDICAL CARE IF:   You have pain that radiates from your back into your legs.  You develop new bowel or bladder control problems.  You have unusual weakness or numbness in your arms or legs.  You develop nausea or vomiting.  You develop abdominal pain.  You feel faint. Document Released: 12/25/2004 Document Revised: 06/26/2011 Document Reviewed: 04/28/2013 [facility] Patient Information 2015 [place], [place].  This information is not intended to replace advice given to you by your health care provider. Make sure you discuss any questions you have with your health care provider. ° °

## 2014-06-04 NOTE — ED Notes (Signed)
Family requesting discharge at this time. Home oxygen tank set-up.

## 2014-06-04 NOTE — ED Provider Notes (Signed)
CSN: [id number]     Arrival date & time 06/04/14  0320 History   None    This chart was scribed for Julianne Rice, MD by Forrestine Him, ED Scribe. This patient was seen in room A02C/A02C and the patient's care was started 4:00 AM.   Chief Complaint  Patient presents with  . Back Pain  . Shortness of Breath   The history is provided by the patient. No language interpreter was used.    HPI Comments: MORISSA OBEIRNE is a 64 y.o. female with a PMHx of CAD, DM, HTN, and CHF who presents to the Emergency Department complaining of progressively worsening lower back pain x few weeks. No recent injury or trauma. She has tried prescribed muscle relaxant without any improvement for symptoms. No fever or chills. No lower extremity weakness or numbness. No incontinence. Pt also reports shortness of breath and bilateral leg swelling that has worsened since recent hospitalization. SOB is exacerbated with exertion/when laying flat and mildly alleviated when sitting up. Ms. Montas is on 2 liters of Oxygen at home. No recent fever, chills, or chest pain. Currently pt takes 80 mg of Lasix daily. Pt with known allergies to Metformin and Levofloxacin.  Past Medical History  Diagnosis Date  . Coronary artery disease     s/p CABG 2007  . Sleep apnea     uses O2 at night  . Ischemic cardiomyopathy     EF 30-35%, s/p ICD 4/08 by Dr Leonia Reeves  . Diabetes mellitus     type II  . Hypertension   . Obesity   . CHF (congestive heart failure)   . Oxygen dependent 2 L  . Morbid obesity 11/06/2012  . Angina decubitus 11/06/2012  . 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 Chronic combined systolic and diastolic CHF, NYHA class 3   . Angina pectoris    Past Surgical History  Procedure Laterality Date  . Cardiac catheterization    . Cardiac defibrillator placement  4/08    by Dr Leonia Reeves (MDT)  . Coronary artery bypass graft   10/05/2005    by Dr Cyndia Bent  . Implantable cardioverter defibrillator implant  09/25/12    attempt of upgrade to CRT-D unsuccessful due to CS anatomy, SJM Unify Asaura device placed with LV port capped by Dr Rayann Heman  . Bi-ventricular implantable cardioverter defibrillator upgrade N/A 09/25/2012    Procedure: BI-VENTRICULAR IMPLANTABLE CARDIOVERTER DEFIBRILLATOR UPGRADE;  Surgeon: Coralyn Mark, MD;  Location: [facility] CATH LAB;  Service: Cardiovascular;  Laterality: N/A;   Family History  Problem Relation Age of Onset  . Diabetes Mother 52    died - HTN  . Other      No early family hx of CAD   History  Substance Use Topics  . Smoking status: Never Smoker   . Smokeless tobacco: Not on file  . Alcohol Use: No   OB History    No data available     Review of Systems  Constitutional: Negative for fever and chills.  Respiratory: Positive for shortness of breath. Negative for cough.   Cardiovascular: Positive for leg swelling. Negative for chest pain.  Gastrointestinal: Negative for nausea, vomiting and abdominal pain.  Genitourinary: Negative for dysuria, flank pain and difficulty urinating.  Musculoskeletal: Positive for myalgias and back pain. Negative for neck pain and neck stiffness.  Skin: Negative for rash and  wound.  Neurological: Negative for dizziness, weakness, light-headedness, numbness and headaches.  Psychiatric/Behavioral: Negative for confusion.  All other systems reviewed and are negative.     Allergies  Metformin and related and Levofloxacin  Home Medications   Prior to Admission medications   Medication Sig Start Date End Date Taking? Authorizing Provider  albuterol (PROVENTIL HFA;VENTOLIN HFA) 108 (90 BASE) MCG/ACT inhaler Inhale 2 puffs into the lungs every 6 (six) hours as needed for wheezing or shortness of breath.   Yes Historical Provider, MD  aspirin EC 81 MG EC tablet Take 1 tablet (81 mg total) by mouth daily. 09/11/12  Yes Jerline Pain, MD  atorvastatin  (LIPITOR) 80 MG tablet Take 1 tablet (80 mg total) by mouth daily at 6 PM. 05/30/14  Yes Barton Dubois, MD  carvedilol (COREG) 25 MG tablet Take 1 tablet (25 mg total) by mouth 2 (two) times daily with a meal. 05/11/14  Yes Jerline Pain, MD  doxycycline (VIBRA-TABS) 100 MG tablet Take 1 tablet (100 mg total) by mouth every 12 (twelve) hours. 05/30/14  Yes Barton Dubois, MD  furosemide (LASIX) 80 MG tablet Take 80 mg by mouth 2 (two) times daily.   Yes Historical Provider, MD  insulin lispro (HUMALOG) 100 UNIT/ML injection Inject 85-100 Units into the skin 2 (two) times daily. Use 50 units in the morning and 85 units at lunch   Yes Historical Provider, MD  Iron-Vitamin C (IRON 100/C PO) Take 1 tablet by mouth every other day.    Yes Historical Provider, MD  isosorbide dinitrate (ISORDIL) 30 MG tablet Take 1 tablet (30 mg total) by mouth daily. 05/30/14  Yes Barton Dubois, MD  latanoprost (XALATAN) 0.005 % ophthalmic solution Place 1 drop into both eyes at bedtime.   Yes Historical Provider, MD  lisinopril (PRINIVIL,ZESTRIL) 10 MG tablet Take 1 tablet (10 mg total) by mouth daily. 05/11/14  Yes Jerline Pain, MD  methocarbamol (ROBAXIN) 500 MG tablet Take 1 tablet (500 mg total) by mouth every 8 (eight) hours as needed for muscle spasms. 05/30/14  Yes Barton Dubois, MD  potassium chloride SA (K-DUR,KLOR-CON) 20 MEQ tablet Take 20 mEq by mouth 2 (two) times daily.   Yes Historical Provider, MD  spironolactone (ALDACTONE) 25 MG tablet Take 1 tablet (25 mg total) by mouth daily. 05/11/14  Yes Jerline Pain, MD  furosemide (LASIX) 40 MG tablet Take 1.5 tablets (60 mg total) by mouth 2 (two) times daily. 05/30/14   Barton Dubois, MD  HYDROcodone-acetaminophen (NORCO) 5-325 MG per tablet Take 1 tablet by mouth every 6 (six) hours as needed for severe pain. 06/04/14   Julianne Rice, MD  potassium phosphate, monobasic, (K-PHOS ORIGINAL) 500 MG tablet Take 500 mg by mouth at bedtime.     Historical Provider, MD   Triage  Vitals: BP 135/54 mmHg  Pulse 76  Temp(Src) 98.5 F (36.9 C) (Oral)  Resp 20  SpO2 95%   Physical Exam  Constitutional: She is oriented to person, place, and time. She appears well-developed and well-nourished. No distress.  Obese  HENT:  Head: Normocephalic and atraumatic.  Mouth/Throat: Oropharynx is clear and moist.  Eyes: EOM are normal. Pupils are equal, round, and reactive to light.  Neck: Normal range of motion. Neck supple. JVD present.  Cardiovascular: Normal rate and regular rhythm.   Pulmonary/Chest: No respiratory distress. She has no wheezes. She has no rales.  Increased respiratory effort. Decreased breath sounds bilateral bases.  Abdominal: Soft. Bowel sounds are normal. She  exhibits no distension and no mass. There is no tenderness. There is no rebound and no guarding.  Musculoskeletal: Normal range of motion. She exhibits tenderness. She exhibits no edema.  Patient with midline lumbar tenderness to palpation. 2+ bilateral lower extremity edema. No calf tenderness.  Neurological: She is alert and oriented to person, place, and time.  5/5 motor in all extremities. Sensation is intact. No saddle anesthesia  Skin: Skin is warm and dry. No rash noted. No erythema.  Psychiatric: She has a normal mood and affect. Her behavior is normal.  Nursing note and vitals reviewed.   ED Course  Procedures (including critical care time)  DIAGNOSTIC STUDIES: Oxygen Saturation is 91% on RA, low by my interpretation.    COORDINATION OF CARE: 4:04 AM- Will give Lasix and Sublimaze. Will order CXR, BNP, i-stat troponin, CBC, and BMP. Discussed treatment plan with pt at bedside and pt agreed to plan.     Labs Review Labs Reviewed  BASIC METABOLIC PANEL - Abnormal; Notable for the following:    Sodium 132 (*)    Chloride 95 (*)    Glucose, Bld 262 (*)    GFR calc non Af Amer 59 (*)    All other components within normal limits  BRAIN NATRIURETIC PEPTIDE - Abnormal; Notable for the  following:    B Natriuretic Peptide 141.6 (*)    All other components within normal limits  CBC  I-STAT TROPOININ, ED    Imaging Review Dg Chest Port 1 View  06/04/2014   CLINICAL DATA:  Back pain and shortness of breath  EXAM: PORTABLE CHEST - 1 VIEW  COMPARISON:  05/25/2014  FINDINGS: Chronic marked cardiomegaly. Dual-chamber ICD/pacer leads from the left have a stable appearance. The patient is status post CABG. There is chronic interstitial coarsening, bronchitic based on chest CT 05/26/2014. No evidence of superimposed pneumonia or edema, as permitted by portable under penetrated technique.  IMPRESSION: Cardiomegaly and chronic bronchitic changes.  No acute findings.   Electronically Signed   By: Monte Fantasia M.D.   On: 06/04/2014 04:33     EKG Interpretation   Date/Time:  Friday Jun 04 2014 03:37:56 EDT Ventricular Rate:  74 PR Interval:  196 QRS Duration: 165 QT Interval:  469 QTC Calculation: 520 R Axis:   -50 Text Interpretation:  Sinus rhythm Left bundle branch block Confirmed by  Lorrie Strauch  MD, Witt Plitt ([id number]) on 06/04/2014 4:45:40 AM      MDM   Final diagnoses:  Midline low back pain without sciatica  Dyspnea on exertion  Physical deconditioning      I personally performed the services described in this documentation, which was scribed in my presence. The recorded information has been reviewed and is accurate.  Patient states she's feels better after pain medication. She has urinated several times after IV Lasix. She ambulates without desaturation.  Patient appears at her baseline. She has physical deconditioning. Concern by family there is not enough help at home. We'll place social work consult for evaluation in the emergency department, however believe the patient is safe to be discharged home.  Julianne Rice, MD 06/05/14 [phone]

## 2014-07-08 ENCOUNTER — Telehealth: Payer: Self-pay | Admitting: Cardiology

## 2014-07-08 ENCOUNTER — Ambulatory Visit (INDEPENDENT_AMBULATORY_CARE_PROVIDER_SITE_OTHER): Payer: Medicaid Other | Admitting: *Deleted

## 2014-07-08 ENCOUNTER — Encounter: Payer: Self-pay | Admitting: Internal Medicine

## 2014-07-08 DIAGNOSIS — I255 Ischemic cardiomyopathy: Secondary | ICD-10-CM

## 2014-07-08 NOTE — Telephone Encounter (Signed)
Spoke with pt and reminded pt of remote transmission that is due today. Pt verbalized understanding.   

## 2014-07-14 NOTE — Progress Notes (Signed)
Remote ICD transmission.   

## 2014-07-18 LAB — CUP PACEART REMOTE DEVICE CHECK
Battery Remaining Percentage: 76 %
Date Time Interrogation Session: 20160710134224
HighPow Impedance: 38 Ohm
Lead Channel Impedance Value: 490 Ohm
Lead Channel Setting Pacing Amplitude: 2.5 V
MDC IDC MSMT LEADCHNL RA SENSING INTR AMPL: 2.3 mV
MDC IDC MSMT LEADCHNL RV IMPEDANCE VALUE: 480 Ohm
MDC IDC MSMT LEADCHNL RV SENSING INTR AMPL: 12 mV
MDC IDC PG SERIAL: 7070007
MDC IDC SET LEADCHNL RA PACING AMPLITUDE: 2 V
MDC IDC SET LEADCHNL RV PACING PULSEWIDTH: 0.5 ms
MDC IDC SET LEADCHNL RV SENSING SENSITIVITY: 0.5 mV
MDC IDC SET ZONE DETECTION INTERVAL: 350 ms
MDC IDC STAT BRADY RA PERCENT PACED: 1 % — AB
MDC IDC STAT BRADY RV PERCENT PACED: 1 % — AB
Zone Setting Detection Interval: 250 ms
Zone Setting Detection Interval: 300 ms

## 2014-07-19 ENCOUNTER — Telehealth: Payer: Self-pay | Admitting: *Deleted

## 2014-07-19 ENCOUNTER — Ambulatory Visit (INDEPENDENT_AMBULATORY_CARE_PROVIDER_SITE_OTHER): Payer: Medicaid Other | Admitting: Cardiology

## 2014-07-19 ENCOUNTER — Encounter: Payer: Self-pay | Admitting: Cardiology

## 2014-07-19 VITALS — BP 138/90 | HR 91 | Ht 63.0 in | Wt 249.8 lb

## 2014-07-19 DIAGNOSIS — I447 Left bundle-branch block, unspecified: Secondary | ICD-10-CM | POA: Diagnosis not present

## 2014-07-19 DIAGNOSIS — Z951 Presence of aortocoronary bypass graft: Secondary | ICD-10-CM

## 2014-07-19 DIAGNOSIS — I519 Heart disease, unspecified: Secondary | ICD-10-CM

## 2014-07-19 DIAGNOSIS — I255 Ischemic cardiomyopathy: Secondary | ICD-10-CM | POA: Diagnosis not present

## 2014-07-19 DIAGNOSIS — E785 Hyperlipidemia, unspecified: Secondary | ICD-10-CM

## 2014-07-19 NOTE — Telephone Encounter (Signed)
Spoke to patient about episode of polymorphic VT from 06/28/14 @ 0330 lasting for 14 sec @ 260 bpm. Patient denied any sx's such as CP, dizziness, or syncope. Informed patient of 6 months driving restriction since ICD shock was aborted when patient spontaneously converted during charge. Patient voiced understanding of all information given. Report placed in red folder. Will contact patient w/ any further recommendations. Note forwarded to Dr.Skains and Dr.Allred.

## 2014-07-19 NOTE — Patient Instructions (Signed)
Medication Instructions:  Please take Furosemide 80 mg for 3 days then decrease to 60 mg a day. Continue all other medications as listed.  Follow-Up: Follow up in 4 months with Dr. Marlou Porch.  You will receive a letter in the mail 2 months before you are due.  Please call us when you receive this letter to schedule your follow up appointment.  Thank you for choosing [facility]!!

## 2014-07-19 NOTE — Progress Notes (Signed)
[place]. [address], [address] [place], [place]  [address] Phone: [phone] Fax:  [phone]  Date:  07/19/2014   ID:  [name], DOB August 28, 1950, MRN [id number]  PCP:  Imelda Pillow, NP   History of Present Illness: [name] is a 64 y.o. female with cardiomyopathy EF 25-30%, ICD, CABG 2007, cardiac catheterization December of 2011 with widely patent grafts, stress test 2013 large defect prior infarct pattern, EF 31%.  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 She also saw Dr. Roxy Manns, not a candidate for epicardial lead. No change in shortness of breath. No chest pain.  Overall I'm pleased with her progress.  Wt Readings from Last 3 Encounters:  07/19/14 249 lb 12.8 oz (113.309 kg)  05/30/14 250 lb 6.4 oz (113.581 kg)  03/12/14 268 lb (121.564 kg)     Past Medical History  Diagnosis Date  . Coronary artery disease     s/p CABG 2007  . Sleep apnea     uses O2 at night  . Ischemic cardiomyopathy     EF 30-35%, s/p ICD 4/08 by Dr Leonia Reeves  . Diabetes mellitus     type II  . Hypertension   . Obesity   . CHF (congestive heart failure)   . Oxygen dependent 2 L  . Morbid obesity 11/06/2012  . Angina decubitus 11/06/2012  . 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 Chronic combined systolic and diastolic CHF, NYHA class 3   . Angina pectoris     Past Surgical History  Procedure Laterality Date  . Cardiac catheterization    . Cardiac defibrillator placement  4/08    by Dr Leonia Reeves ([facility])  . Coronary artery bypass graft  10/05/2005    by Dr Cyndia Bent  . Implantable cardioverter defibrillator implant  09/25/12    attempt of upgrade to CRT-D unsuccessful due to CS anatomy, SJM Unify Asaura device placed with LV port capped by Dr Rayann Heman  . Bi-ventricular implantable cardioverter defibrillator upgrade N/A 09/25/2012    Procedure: BI-VENTRICULAR IMPLANTABLE CARDIOVERTER  DEFIBRILLATOR UPGRADE;  Surgeon: Coralyn Mark, MD;  Location: [facility] CATH LAB;  Service: Cardiovascular;  Laterality: N/A;    Current Outpatient Prescriptions  Medication Sig Dispense Refill  . albuterol (PROVENTIL HFA;VENTOLIN HFA) 108 (90 BASE) MCG/ACT inhaler Inhale 2 puffs into the lungs every 6 (six) hours as needed for wheezing or shortness of breath.    Marland Kitchen aspirin EC 81 MG EC tablet Take 1 tablet (81 mg total) by mouth daily. 30 tablet 12  . atorvastatin (LIPITOR) 80 MG tablet Take 1 tablet (80 mg total) by mouth daily at 6 PM. 30 tablet 1  . carvedilol (COREG) 25 MG tablet Take 1 tablet (25 mg total) by mouth 2 (two) times daily with a meal. 60 tablet 3  . doxycycline (VIBRA-TABS) 100 MG tablet Take 1 tablet (100 mg total) by mouth every 12 (twelve) hours. 6 tablet 0  . furosemide (LASIX) 40 MG tablet Take 1.5 tablets (60 mg total) by mouth 2 (two) times daily. 90 tablet 1  . HYDROcodone-acetaminophen (NORCO) 5-325 MG per tablet Take 1 tablet by mouth every 6 (six) hours as needed for severe pain. 20 tablet 0  . insulin lispro (HUMALOG) 100 UNIT/ML injection Inject 85-100 Units into the skin 2 (two) times daily. Use 50 units in the morning  and 85 units at lunch    . Iron-Vitamin C (IRON 100/C PO) Take 1 tablet by mouth every other day.     . isosorbide dinitrate (ISORDIL) 30 MG tablet Take 1 tablet (30 mg total) by mouth daily. 30 tablet 1  . latanoprost (XALATAN) 0.005 % ophthalmic solution Place 1 drop into both eyes at bedtime.    Marland Kitchen lisinopril (PRINIVIL,ZESTRIL) 10 MG tablet Take 1 tablet (10 mg total) by mouth daily. 30 tablet 3  . methocarbamol (ROBAXIN) 500 MG tablet Take 1 tablet (500 mg total) by mouth every 8 (eight) hours as needed for muscle spasms. 45 tablet 0  . potassium chloride SA (K-DUR,KLOR-CON) 20 MEQ tablet Take 20 mEq by mouth 2 (two) times daily.    . potassium phosphate, monobasic, (K-PHOS ORIGINAL) 500 MG tablet Take 500 mg by mouth at bedtime.     Marland Kitchen spironolactone  (ALDACTONE) 25 MG tablet Take 1 tablet (25 mg total) by mouth daily. 30 tablet 3   No current facility-administered medications for this visit.    Allergies:    Allergies  Allergen Reactions  . Metformin And Related Itching, Swelling and Other (See Comments)    Leg pain & swelling in legs  . Levofloxacin Itching    Social History:  The patient  reports that she has never smoked. She does not have any smokeless tobacco history on file. She reports that she does not drink alcohol or use illicit drugs.   ROS:  Please see the history of present illness.   Denies any syncope, bleeding, orthopnea, PND  PHYSICAL EXAM: VS:  BP 138/90 mmHg  Pulse 91  Ht 5\' 3"  (1.6 m)  Wt 249 lb 12.8 oz (113.309 kg)  BMI 44.26 kg/m2  SpO2 94% Well nourished, well developed, in no acute distress HEENT: normal Neck: no JVD Cardiac:  normal S1, S2; RRR; no murmur Lungs:  clear to auscultation bilaterally, no wheezing, rhonchi or rales Abd: soft, nontender, no hepatomegalyObese Ext: no edema Skin: warm and dry Neuro: no focal abnormalities noted  EKG: 11/06/12: Left bundle branch block, 68, sinus rhythm, no change from prior 06/10/13-sinus rhythm, 63, left bundle branch block Cardiac angiogram-2011-widely patent grafts Nuclear stress test-2013-no ischemia, old infarct, EF 31%  ASSESSMENT AND PLAN:  1. Chronic systolic heart failure-fairly well compensated. NYHA class 2-3. Has continued shortness of breath. Good job on weight loss over the last year. Continue to advocate dietary compliance, low-salt diet, fluid restrictions. I will give her Lasix 80 mg twice a day for the next 3 days. Go back to 60 mg BID after that. 2. Left bundle branch block-unable to place biventricular lead due to vein sclerosis. Not a candidate for epicardial lead. 3. Morbid obesity-encourage weight loss. Low carbohydrate diet.Discussed at length today. 4. Angina-chest pain with exertion has improved greatly with addition of  isosorbide. Prior catheterization reassuring. Nuclear stress test reassuring. 5. Hypertension-currently well controlled check a basic metabolic profile, on spironolactone. Prior creatinine 1.3. Prior potassium 5.1. 6. Diabetes-continue to work hard with your primary physician. 7. We will see her back in four months.   Signed, Candee Furbish, MD [facility]  07/19/2014 11:02 AM

## 2014-07-20 ENCOUNTER — Encounter: Payer: Self-pay | Admitting: *Deleted

## 2014-07-22 NOTE — Telephone Encounter (Signed)
VT noted.  Jamie Furbish, MD

## 2014-08-12 ENCOUNTER — Telehealth: Payer: Self-pay | Admitting: Cardiology

## 2014-08-12 ENCOUNTER — Ambulatory Visit (INDEPENDENT_AMBULATORY_CARE_PROVIDER_SITE_OTHER): Payer: Medicaid Other | Admitting: *Deleted

## 2014-08-12 DIAGNOSIS — Z95810 Presence of automatic (implantable) cardiac defibrillator: Secondary | ICD-10-CM | POA: Diagnosis not present

## 2014-08-12 DIAGNOSIS — I5042 Chronic combined systolic (congestive) and diastolic (congestive) heart failure: Secondary | ICD-10-CM | POA: Diagnosis not present

## 2014-08-12 NOTE — Telephone Encounter (Signed)
Spoke with pt and reminded pt of remote transmission that is due today. Pt verbalized understanding.   

## 2014-08-16 NOTE — Progress Notes (Signed)
EPIC Encounter for ICM Monitoring  Patient Name: [name] is a 64 y.o. female Date: 08/16/2014 Primary Care Physican: Imelda Pillow, NP Primary Cardiologist: Marlou Porch Electrophysiologist: Allred Dry Weight: 267 lbs       In the past month, have you:  1. Gained more than 2 pounds in a day or more than 5 pounds in a week? Uncertain, patient does not weigh regularly.   2. Had changes in your medications (with verification of current medications)? Patient saw Dr Marlou Porch 07/19/2014 and increased Lasix to 80mg  bid x 3 days.  She resumed normal dosage of 60mg  bid.  3. Had more shortness of breath than is usual for you? no  4. Limited your activity because of shortness of breath? no  5. Not been able to sleep because of shortness of breath? no  6. Had increased swelling in your feet or ankles? no  7. Had symptoms of dehydration (dizziness, dry mouth, increased thirst, decreased urine output) no  8. Had changes in sodium restriction? no  9. Been compliant with medication? Yes   ICM trend:    Follow-up plan: ICM clinic phone appointment in 09/20/2014.  Corvue trends slightly elevated from 07/26/2014 to 08/04/2014.  Patient denies symptoms during this time.  No changes made today.   Copy of note sent to patient's primary care physician, primary cardiologist, and device following physician.  [name], RN, BSN 08/16/2014 4:34 PM

## 2014-09-20 ENCOUNTER — Ambulatory Visit (INDEPENDENT_AMBULATORY_CARE_PROVIDER_SITE_OTHER): Payer: Medicaid Other

## 2014-09-20 DIAGNOSIS — Z95810 Presence of automatic (implantable) cardiac defibrillator: Secondary | ICD-10-CM | POA: Diagnosis not present

## 2014-09-20 DIAGNOSIS — I5042 Chronic combined systolic (congestive) and diastolic (congestive) heart failure: Secondary | ICD-10-CM

## 2014-09-22 NOTE — Progress Notes (Signed)
EPIC Encounter for ICM Monitoring  Patient Name: [name] is a 63 y.o. female Date: 09/22/2014 Primary Care Physican: Imelda Pillow, NP Primary Cardiologist: Marlou Porch Electrophysiologist: Allred Dry Weight: 249 lbs (does not weigh at home)       In the past month, have you:  1. Gained more than 2 pounds in a day or more than 5 pounds in a week? no  2. Had changes in your medications (with verification of current medications)? no  3. Had more shortness of breath than is usual for you? no  4. Limited your activity because of shortness of breath? no  5. Not been able to sleep because of shortness of breath? no  6. Had increased swelling in your feet or ankles? no  7. Had symptoms of dehydration (dizziness, dry mouth, increased thirst, decreased urine output) no  8. Had changes in sodium restriction? no  9. Been compliant with medication? Yes   ICM trend:   Follow-up plan: ICM clinic phone appointment 10/25/2014.  CorVue transmission revealed below baseline 08/20/2014 to 08/26/2014 and 09/15/2014 to 09/17/2014.  Patient denied any HF symptoms during this time.  She reported no changes.  Unable to weigh at home due to no scale.  Encouraged her to obtain scale if possible for daily weights and fluid monitoring.  She restrictions her salt intake.   Copy of note sent to patient's primary care physician, primary cardiologist, and device following physician.  [name], RN, CCM 09/22/2014 8:51 AM

## 2014-10-14 ENCOUNTER — Ambulatory Visit (INDEPENDENT_AMBULATORY_CARE_PROVIDER_SITE_OTHER): Payer: Medicaid Other | Admitting: *Deleted

## 2014-10-14 ENCOUNTER — Encounter: Payer: Self-pay | Admitting: Internal Medicine

## 2014-10-14 DIAGNOSIS — I255 Ischemic cardiomyopathy: Secondary | ICD-10-CM | POA: Diagnosis not present

## 2014-10-14 NOTE — Progress Notes (Signed)
Remote ICD transmission.   

## 2014-11-01 LAB — CUP PACEART REMOTE DEVICE CHECK
Battery Remaining Percentage: 74 %
Brady Statistic RA Percent Paced: 1 % — CL
Date Time Interrogation Session: 20161024121253
HighPow Impedance: 37 Ohm
Implantable Lead Implant Date: 20080423
Implantable Lead Implant Date: 20140918
Implantable Lead Location: 753859
Implantable Lead Model: 5076
Lead Channel Impedance Value: 440 Ohm
Lead Channel Impedance Value: 480 Ohm
Lead Channel Sensing Intrinsic Amplitude: 12 mV
Lead Channel Setting Pacing Amplitude: 2 V
Lead Channel Setting Pacing Amplitude: 2.5 V
Lead Channel Setting Sensing Sensitivity: 0.5 mV
MDC IDC LEAD IMPLANT DT: 20080423
MDC IDC LEAD LOCATION: 753858
MDC IDC LEAD LOCATION: 753860
MDC IDC MSMT BATTERY REMAINING LONGEVITY: 72 mo
MDC IDC MSMT LEADCHNL RA SENSING INTR AMPL: 1.9 mV
MDC IDC SET LEADCHNL RV PACING PULSEWIDTH: 0.5 ms
MDC IDC STAT BRADY RV PERCENT PACED: 1 % — AB
Pulse Gen Serial Number: 7070007

## 2014-11-12 ENCOUNTER — Ambulatory Visit (INDEPENDENT_AMBULATORY_CARE_PROVIDER_SITE_OTHER): Payer: Medicaid Other | Admitting: *Deleted

## 2014-11-12 DIAGNOSIS — Z95810 Presence of automatic (implantable) cardiac defibrillator: Secondary | ICD-10-CM | POA: Diagnosis not present

## 2014-11-12 DIAGNOSIS — I5042 Chronic combined systolic (congestive) and diastolic (congestive) heart failure: Secondary | ICD-10-CM

## 2014-11-12 NOTE — Progress Notes (Signed)
EPIC Encounter for ICM Monitoring  Patient Name: [name] is a 64 y.o. female Date: 11/12/2014 Primary Care Physican: Imelda Pillow, NP Primary Cardiologist: Marlou Porch Electrophysiologist: Allred Dry Weight: 248 lb       In the past month, have you:  1. Gained more than 2 pounds in a day or more than 5 pounds in a week? no  2. Had changes in your medications (with verification of current medications)? no  3. Had more shortness of breath than is usual for you? no  4. Limited your activity because of shortness of breath? no  5. Not been able to sleep because of shortness of breath? no  6. Had increased swelling in your feet or ankles? no  7. Had symptoms of dehydration (dizziness, dry mouth, increased thirst, decreased urine output) no  8. Had changes in sodium restriction? no  9. Been compliant with medication? Yes   ICM trend: 11/12/2014    Follow-up plan: ICM clinic phone appointment on 12/13/2014.  Corvue impedance below baseline ~10/23/2014 to 11/01/2014 suggesting fluid retention and above the baseline 11/06/2014 to 11/10/2014 suggesting dryness.  Reviewed what may cause fluid retention such as eating in restaurants or home foods high in sodium, increase in fluid intake missing meds and she denied any of these.  She stated she has a fluid restriction and follows it.  Advised her continue to follow low sodium foods and fluid restrictions.  She denied any symptoms of dehydration.  He stated she is feeling fine at this time.  No changes today.    Copy of note sent to patient's primary care physician, primary cardiologist, and device following physician.  [name], RN, CCM 11/12/2014 1:44 PM

## 2014-11-16 ENCOUNTER — Ambulatory Visit (INDEPENDENT_AMBULATORY_CARE_PROVIDER_SITE_OTHER): Payer: Medicaid Other | Admitting: Cardiology

## 2014-11-16 ENCOUNTER — Encounter: Payer: Self-pay | Admitting: Cardiology

## 2014-11-16 VITALS — BP 118/62 | HR 60 | Ht 63.0 in | Wt 248.0 lb

## 2014-11-16 DIAGNOSIS — I10 Essential (primary) hypertension: Secondary | ICD-10-CM | POA: Diagnosis not present

## 2014-11-16 DIAGNOSIS — I447 Left bundle-branch block, unspecified: Secondary | ICD-10-CM | POA: Diagnosis not present

## 2014-11-16 DIAGNOSIS — Z79899 Other long term (current) drug therapy: Secondary | ICD-10-CM | POA: Diagnosis not present

## 2014-11-16 DIAGNOSIS — Z951 Presence of aortocoronary bypass graft: Secondary | ICD-10-CM

## 2014-11-16 DIAGNOSIS — I519 Heart disease, unspecified: Secondary | ICD-10-CM

## 2014-11-16 LAB — BASIC METABOLIC PANEL
BUN: 19 mg/dL (ref 7–25)
CHLORIDE: 102 mmol/L (ref 98–110)
CO2: 24 mmol/L (ref 20–31)
Calcium: 9.7 mg/dL (ref 8.6–10.4)
Creat: 1.23 mg/dL — ABNORMAL HIGH (ref 0.50–0.99)
Glucose, Bld: 198 mg/dL — ABNORMAL HIGH (ref 65–99)
Potassium: 4.1 mmol/L (ref 3.5–5.3)
Sodium: 138 mmol/L (ref 135–146)

## 2014-11-16 NOTE — Progress Notes (Signed)
[place]. [address], [address] [place], [place]  [address] Phone: [phone] Fax:  [phone]  Date:  11/16/2014   ID:  [name], DOB Dec 30, 1950, MRN [id number]  PCP:  Imelda Pillow, NP   History of Present Illness: [name] is a 64 y.o. female with cardiomyopathy EF 25-30%, ICD, CABG 2007, cardiac catheterization December of 2011 with widely patent grafts, stress test 2013 large defect prior infarct pattern, EF 31%.  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 She also saw Dr. Roxy Manns, not a candidate for epicardial lead. No change in shortness of breath. No chest pain.  Overall I'm pleased with her progress. She is maintaining her weight. No change in shortness of breath, no chest pain. She is not taking a statin, retry but she developed a rash on her stomach and back. Her LDL has been 40 in the past.  Wt Readings from Last 3 Encounters:  11/16/14 248 lb (112.492 kg)  07/19/14 249 lb 12.8 oz (113.309 kg)  05/30/14 250 lb 6.4 oz (113.581 kg)     Past Medical History  Diagnosis Date  . Coronary artery disease     s/p CABG 2007  . Sleep apnea     uses O2 at night  . Ischemic cardiomyopathy     EF 30-35%, s/p ICD 4/08 by Dr Leonia Reeves  . Diabetes mellitus     type II  . Hypertension   . Obesity   . CHF (congestive heart failure) ([place])   . Oxygen dependent 2 L  . Morbid obesity ([place]) 11/06/2012  . Angina decubitus ([place]) 11/06/2012  . 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 Chronic combined systolic and diastolic CHF, NYHA class 3 ([place])   . Angina pectoris [facility])     Past Surgical History  Procedure Laterality Date  . Cardiac catheterization    . Cardiac defibrillator placement  4/08    by Dr Leonia Reeves ([facility])  . Coronary artery bypass graft  10/05/2005    by Dr Cyndia Bent  . Implantable cardioverter defibrillator implant  09/25/12    attempt of upgrade to CRT-D  unsuccessful due to CS anatomy, SJM Unify Asaura device placed with LV port capped by Dr Rayann Heman  . Bi-ventricular implantable cardioverter defibrillator upgrade N/A 09/25/2012    Procedure: BI-VENTRICULAR IMPLANTABLE CARDIOVERTER DEFIBRILLATOR UPGRADE;  Surgeon: Coralyn , MD;  Location: [facility] CATH LAB;  Service: Cardiovascular;  Laterality: N/A;    Current Outpatient Prescriptions  Medication Sig Dispense Refill  . albuterol (PROVENTIL HFA;VENTOLIN HFA) 108 (90 BASE) MCG/ACT inhaler Inhale 2 puffs into the lungs every 6 (six) hours as needed for wheezing or shortness of breath.    Marland Kitchen aspirin EC 81 MG EC tablet Take 1 tablet (81 mg total) by mouth daily. 30 tablet 12  . carvedilol (COREG) 25 MG tablet Take 1 tablet (25 mg total) by mouth 2 (two) times daily with a meal. 60 tablet 3  . furosemide (LASIX) 40 MG tablet Take 1.5 tablets (60 mg total) by mouth 2 (two) times daily. 90 tablet 1  . HYDROcodone-acetaminophen (NORCO) 5-325 MG per tablet Take 1 tablet by mouth every 6 (six) hours as needed for severe pain. 20 tablet 0  . insulin lispro (HUMALOG) 100 UNIT/ML injection Inject 85-100 Units into the skin 2 (two) times daily. Use 50 units in the morning  and 85 units at lunch    . Iron-Vitamin C (IRON 100/C PO) Take 1 tablet by mouth every other day.     . isosorbide dinitrate (ISORDIL) 30 MG tablet Take 1 tablet (30 mg total) by mouth daily. 30 tablet 1  . latanoprost (XALATAN) 0.005 % ophthalmic solution Place 1 drop into both eyes at bedtime.    Marland Kitchen lisinopril (PRINIVIL,ZESTRIL) 10 MG tablet Take 20 mg by mouth daily.    . methocarbamol (ROBAXIN) 500 MG tablet Take 1 tablet (500 mg total) by mouth every 8 (eight) hours as needed for muscle spasms. 45 tablet 0  . potassium phosphate, monobasic, (K-PHOS ORIGINAL) 500 MG tablet Take 500 mg by mouth at bedtime.     Marland Kitchen spironolactone (ALDACTONE) 25 MG tablet Take 1 tablet (25 mg total) by mouth daily. 30 tablet 3   No current facility-administered  medications for this visit.    Allergies:    Allergies  Allergen Reactions  . Metformin And Related Itching, Swelling and Other (See Comments)    Leg pain & swelling in legs  . Levofloxacin Itching    Social History:  The patient  reports that she has never smoked. She does not have any smokeless tobacco history on file. She reports that she does not drink alcohol or use illicit drugs.   ROS:  Please see the history of present illness.   Denies any syncope, bleeding, orthopnea, PND  PHYSICAL EXAM: VS:  BP 118/62 mmHg  Pulse 60  Ht 5\' 3"  (1.6 m)  Wt 248 lb (112.492 kg)  BMI 43.94 kg/m2 Well nourished, well developed, in no acute distress HEENT: normal Neck: no JVD Cardiac:  normal S1, S2; RRR; no murmur Lungs:  clear to auscultation bilaterally, no wheezing, rhonchi or rales Abd: soft, nontender, no hepatomegalyObese Ext: no edema Skin: warm and dry Neuro: no focal abnormalities noted  EKG: 11/06/12: Left bundle branch block, 68, sinus rhythm, no change from prior 06/10/13-sinus rhythm, 63, left bundle branch block  Cardiac angiogram-2011-widely patent grafts  Nuclear stress test-2013-no ischemia, old infarct, EF 31%  ASSESSMENT AND PLAN:  1. Chronic systolic heart failure-fairly well compensated. NYHA class 2-3. Has continued shortness of breath, but no significant change. Continue to advocate dietary compliance, low-salt diet, fluid restrictions. Continue with currently 6 dose 2. Left bundle branch block-unable to place biventricular lead due to vein sclerosis. Not a candidate for epicardial lead. ICD functioning well. 3. Not on statin because of "breaking out" rash. Stomach and back.  4. Morbid obesity-encourage weight loss. Low carbohydrate diet.Discussed at length today. 5. Angina-chest pain with exertion has improved greatly with addition of isosorbide. Prior catheterization reassuring. Nuclear stress test reassuring. 6. Hypertension-currently well controlled check a  basic metabolic profile, on spironolactone. Prior creatinine 0.9 Prior potassium 4.0 7. Diabetes-continue to work hard with your primary physician. 8. We will see her back in four months, [name].   Signed, Candee Furbish, MD [facility]  11/16/2014 8:51 AM

## 2014-11-16 NOTE — Patient Instructions (Signed)
Medication Instructions:  The current medical regimen is effective;  continue present plan and medications.  Labwork: Please have blood work today (BMP)  Follow-Up: Follow up in 4 months with [name], PA.  If you need a refill on your cardiac medications before your next appointment, please call your pharmacy.

## 2014-11-18 ENCOUNTER — Encounter: Payer: Self-pay | Admitting: *Deleted

## 2014-12-13 ENCOUNTER — Ambulatory Visit (INDEPENDENT_AMBULATORY_CARE_PROVIDER_SITE_OTHER): Payer: Medicaid Other

## 2014-12-13 DIAGNOSIS — I5042 Chronic combined systolic (congestive) and diastolic (congestive) heart failure: Secondary | ICD-10-CM

## 2014-12-13 DIAGNOSIS — Z95810 Presence of automatic (implantable) cardiac defibrillator: Secondary | ICD-10-CM

## 2014-12-13 NOTE — Progress Notes (Signed)
EPIC Encounter for ICM Monitoring  Patient Name: [name] is a 64 y.o. female Date: 12/13/2014 Primary Care Physican: Imelda Pillow, NP Primary Cardiologist: Marlou Porch Electrophysiologist: Allred Dry Weight: No home scale       In the past month, have you:  1. Gained more than 2 pounds in a day or more than 5 pounds in a week? Not known  2. Had changes in your medications (with verification of current medications)? no  3. Had more shortness of breath than is usual for you? no  4. Limited your activity because of shortness of breath? no  5. Not been able to sleep because of shortness of breath? no  6. Had increased swelling in your feet or ankles? no  7. Had symptoms of dehydration (dizziness, dry mouth, increased thirst, decreased urine output) no  8. Had changes in sodium restriction? no  9. Been compliant with medication? Yes   ICM trend: 12/13/2014   Follow-up plan: ICM clinic phone appointment on 12/22/2014. Corvue daily impedance below baseline ~12/02/2014 to 12/07/2014 and 12/08/2014 to 12/13/2014 suggesting fluid retention.  She reported swelling of legs and feet.  No SOB and does not have a scale to weigh.  Lab results on 11/16/2014 - Creatinine 1.23 and BUN 19.     Reviewed Corvue with Dr Rayann Heman and order to increase lasix to 80 mg twice a day x 3 days, repeat transmission in a week.   Patient notified of orders to take Lasix 80 mg twice a day x 3 days and then resume previous dosage of 120 mg daily.  Advised to call if she has any problems such as weakness, nausea, vomiting, muscle cramps and change in heartbeat. She verbalized understanding.   Copy of note sent to patient's primary care physician, primary cardiologist, and device following physician.  [name], RN, CCM 12/13/2014 1:59 PM

## 2014-12-22 ENCOUNTER — Ambulatory Visit (INDEPENDENT_AMBULATORY_CARE_PROVIDER_SITE_OTHER): Payer: Medicaid Other

## 2014-12-22 DIAGNOSIS — I5042 Chronic combined systolic (congestive) and diastolic (congestive) heart failure: Secondary | ICD-10-CM

## 2014-12-22 DIAGNOSIS — Z95810 Presence of automatic (implantable) cardiac defibrillator: Secondary | ICD-10-CM

## 2014-12-23 NOTE — Progress Notes (Signed)
EPIC Encounter for ICM Monitoring  Patient Name: [name] is a 64 y.o. female Date: 12/23/2014 Primary Care Physican: Imelda Pillow, NP Primary Cardiologist: Marlou Porch Electrophysiologist: Allred Dry Weight: No home scale       In the past month, have you:  1. Gained more than 2 pounds in a day or more than 5 pounds in a week? Unknown  2. Had changes in your medications (with verification of current medications)? no  3. Had more shortness of breath than is usual for you? no  4. Limited your activity because of shortness of breath? no  5. Not been able to sleep because of shortness of breath? no  6. Had increased swelling in your feet or ankles? no  7. Had symptoms of dehydration (dizziness, dry mouth, increased thirst, decreased urine output) no  8. Had changes in sodium restriction? no  9. Been compliant with medication? Yes   ICM trend: 12/22/2014   Follow-up plan: ICM clinic phone appointment on 02/21/2015, appointment with Dr Rayann Heman 01/17/2015.  Corvue daily impedance trending along baseline after taking extra Lasix as ordered on 12/13/2014.  She stated the leg and feet swelling have resolved.  She denied any HF symptoms today and stated she is feeling better.  Encouraged to call should she experience any HF symptoms.   No changes today.  Copy of note sent to patient's primary care physician, primary cardiologist, and device following physician.  [name], RN, CCM 12/23/2014 11:55 AM

## 2015-01-16 DIAGNOSIS — I472 Ventricular tachycardia, unspecified: Secondary | ICD-10-CM

## 2015-01-16 HISTORY — DX: Ventricular tachycardia, unspecified: I47.20

## 2015-01-16 HISTORY — DX: Ventricular tachycardia: I47.2

## 2015-01-17 ENCOUNTER — Encounter: Payer: Self-pay | Admitting: Internal Medicine

## 2015-01-17 ENCOUNTER — Ambulatory Visit (INDEPENDENT_AMBULATORY_CARE_PROVIDER_SITE_OTHER): Payer: Medicaid Other | Admitting: Internal Medicine

## 2015-01-17 VITALS — BP 136/82 | HR 83 | Ht 63.0 in | Wt 253.6 lb

## 2015-01-17 DIAGNOSIS — I472 Ventricular tachycardia, unspecified: Secondary | ICD-10-CM

## 2015-01-17 DIAGNOSIS — I519 Heart disease, unspecified: Secondary | ICD-10-CM

## 2015-01-17 DIAGNOSIS — I255 Ischemic cardiomyopathy: Secondary | ICD-10-CM

## 2015-01-17 DIAGNOSIS — I447 Left bundle-branch block, unspecified: Secondary | ICD-10-CM

## 2015-01-17 NOTE — Progress Notes (Signed)
PCP: Imelda Pillow, NP Primary Cardiologist:  Dr Nolen Mu is a 65 y.o. female who presents today for electrophysiology followup.  Since her last visit, she has done well.  Her SOB is stable.   She has stable angina followed by Dr Marlou Porch.She had sustained VT yesterday (CL 340 msec) successfully treated with ATP.  She was asymptomatic with this.  Today, she denies symptoms of palpitations, chest pain,  dizziness, presyncope, syncope, or ICD shocks.  The patient is otherwise without complaint today.   Past Medical History  Diagnosis Date  . Coronary artery disease     s/p CABG 2007  . Sleep apnea     uses O2 at night  . Ischemic cardiomyopathy     EF 30-35%, s/p ICD 4/08 by Dr Leonia Reeves  . Diabetes mellitus     type II  . Hypertension   . Obesity   . CHF (congestive heart failure) ([place])   . Oxygen dependent 2 L  . Morbid obesity ([place]) 11/06/2012  . Angina decubitus ([place]) 11/06/2012  . 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 Chronic combined systolic and diastolic CHF, NYHA class 3 ([place])   . Angina pectoris [facility])    Past Surgical History  Procedure Laterality Date  . Cardiac catheterization    . Cardiac defibrillator placement  4/08    by Dr Leonia Reeves ([facility])  . Coronary artery bypass graft  10/05/2005    by Dr Cyndia Bent  . Implantable cardioverter defibrillator implant  09/25/12    attempt of upgrade to CRT-D unsuccessful due to CS anatomy, SJM Unify Asaura device placed with LV port capped by Dr Rayann Heman  . Bi-ventricular implantable cardioverter defibrillator upgrade N/A 09/25/2012    Procedure: BI-VENTRICULAR IMPLANTABLE CARDIOVERTER DEFIBRILLATOR UPGRADE;  Surgeon: Coralyn Mark, MD;  Location: [facility] CATH LAB;  Service: Cardiovascular;  Laterality: N/A;    Current Outpatient Prescriptions  Medication Sig Dispense Refill  . albuterol (PROVENTIL HFA;VENTOLIN HFA) 108 (90 BASE)  MCG/ACT inhaler Inhale 2 puffs into the lungs every 6 (six) hours as needed for wheezing or shortness of breath.    Marland Kitchen aspirin EC 81 MG EC tablet Take 1 tablet (81 mg total) by mouth daily. 30 tablet 12  . carvedilol (COREG) 25 MG tablet Take 1 tablet (25 mg total) by mouth 2 (two) times daily with a meal. 60 tablet 3  . furosemide (LASIX) 40 MG tablet Take 1.5 tablets (60 mg total) by mouth 2 (two) times daily. 90 tablet 1  . GLIPIZIDE PO Take 1 tablet by mouth daily.    Marland Kitchen HYDROcodone-acetaminophen (NORCO) 5-325 MG per tablet Take 1 tablet by mouth every 6 (six) hours as needed for severe pain. 20 tablet 0  . insulin lispro (HUMALOG) 100 UNIT/ML injection Inject 85-100 Units into the skin 2 (two) times daily. Use 50 units in the morning and 85 units at lunch    . Iron-Vitamin C (IRON 100/C PO) Take 1 tablet by mouth every other day.     . isosorbide dinitrate (ISORDIL) 30 MG tablet Take 1 tablet (30 mg total) by mouth daily. 30 tablet 1  . latanoprost (XALATAN) 0.005 % ophthalmic solution Place 1 drop into both eyes at bedtime.    Marland Kitchen lisinopril (PRINIVIL,ZESTRIL) 10 MG tablet Take 20 mg by mouth daily.    . methocarbamol (ROBAXIN) 500 MG tablet Take 1  tablet (500 mg total) by mouth every 8 (eight) hours as needed for muscle spasms. 45 tablet 0  . potassium phosphate, monobasic, (K-PHOS ORIGINAL) 500 MG tablet Take 500 mg by mouth at bedtime.     Marland Kitchen spironolactone (ALDACTONE) 25 MG tablet Take 1 tablet (25 mg total) by mouth daily. 30 tablet 3   No current facility-administered medications for this visit.    Physical Exam: Filed Vitals:   01/17/15 1531  BP: 136/82  Pulse: 83  Height: 5\' 3"  (1.6 m)  Weight: 253 lb 9.6 oz (115.032 kg)    GEN- The patient is chronically ill and obese appearing, alert and oriented x 3 today.   Head- normocephalic, atraumatic Eyes-  Sclera clear, conjunctiva pink Ears- hearing intact Oropharynx- clear Lungs- Clear to ausculation bilaterally, normal work of  breathing Chest- ICD pocket is well healed Heart- Regular rate and rhythm, no murmurs, rubs or gallops, PMI not laterally displaced GI- soft, NT, ND, + BS Extremities- no clubbing, cyanosis, +1 edema Neuro- strength/ sensation are intact  ICD interrogation- reviewed in detail today,  See PACEART report  Assessment and Plan:  1.  Chronic systolic dysfunction The patient has chronic systolic dysfunction, NYHA Class III CHF, and LBBB.  LV lead was unsuccessful due to scaring along the lateral was of the LV and few venous targets.  She has a BiV Device with the LV port plugged.   She has discussed with CV surgery and is not interested in epicardial LV lead Normal ICD function.  See Claudia Desanctis Art report Sharman Cheek to follow coreview in the ICM device clinic No changes today  2 CAD/ ischemic CM No ischemic symptoms Continue medical therapy  3. LBBB As above  4. morbid obesity Weight reduction encouraged Body mass index is 44.93 kg/(m^2).   5. VT Continue current medicines I have discussed SJM Fortify Assura advisary with the patient today. She understands that recommendation from SJM is to not replace the device at this time. The patient is not device dependant.  The patient has had appropriate device therapy in the past or implanted for secondary prevention.  Vibratory alert demonstrated today.  She is actively remotely monitored and understands the importance of compliance today.   We have decided together to monitor conservatively rather than pursue gen change at this time   Merlin Return to see EP NP in 12 months  Thompson Grayer MD, [facility] 01/17/2015 4:04 PM

## 2015-01-17 NOTE — Patient Instructions (Signed)
Medication Instructions:  Your physician recommends that you continue on your current medications as directed. Please refer to the Current Medication list given to you today.   Labwork: None ordered   Testing/Procedures: None ordered   Follow-Up: Remote monitoring is used to monitor your  ICD from home. This monitoring reduces the number of office visits required to check your device to one time per year. It allows [name] to keep an eye on the functioning of your device to ensure it is working properly. You are scheduled for a device check from home on 04/18/15. You may send your transmission at any time that day. If you have a wireless device, the transmission will be sent automatically. After your physician reviews your transmission, you will receive a postcard with your next transmission date.   Your physician wants you to follow-up in: 12 months with Chanetta Marshall, NP You will receive a reminder letter in the mail two months in advance. If you don't receive a letter, please call our office to schedule the follow-up appointment.   Any Other Special Instructions Will Be Listed Below (If Applicable).     If you need a refill on your cardiac medications before your next appointment, please call your pharmacy.

## 2015-02-21 ENCOUNTER — Telehealth: Payer: Self-pay | Admitting: Cardiology

## 2015-02-21 ENCOUNTER — Ambulatory Visit (INDEPENDENT_AMBULATORY_CARE_PROVIDER_SITE_OTHER): Payer: Medicare HMO

## 2015-02-21 DIAGNOSIS — I5042 Chronic combined systolic (congestive) and diastolic (congestive) heart failure: Secondary | ICD-10-CM | POA: Diagnosis not present

## 2015-02-21 DIAGNOSIS — Z95810 Presence of automatic (implantable) cardiac defibrillator: Secondary | ICD-10-CM

## 2015-02-21 NOTE — Progress Notes (Signed)
EPIC Encounter for ICM Monitoring  Patient Name: [name] is a 65 y.o. female Date: 02/21/2015 Primary Care Physican: Imelda Pillow, NP Primary Cardiologist: Marlou Porch  Electrophysiologist: Allred Dry Weight: No home scale       In the past month, have you:  1. Gained more than 2 pounds in a day or more than 5 pounds in a week? no  2. Had changes in your medications (with verification of current medications)? Yes, Furosemide increased to 80 mg tid by PCP office in response to her swollen legs.  She continues that dosage until she returns to see the PCP this week.    3. Had more shortness of breath than is usual for you? no  4. Limited your activity because of shortness of breath? no  5. Not been able to sleep because of shortness of breath? no  6. Had increased swelling in your feet or ankles? Yes, legs swollen starting around 02/07/2015 but has resolved since PCP increased Lasix.   7. Had symptoms of dehydration (dizziness, dry mouth, increased thirst, decreased urine output) no  8. Had changes in sodium restriction? no  9. Been compliant with medication? Yes   ICM trend: 3 month view for 02/21/2015   ICM trend: 1 year view for 02/21/2015   Follow-up plan: ICM clinic phone appointment 03/28/2015 and has appointment with [name], PA on 03/17/2015.   [place] thoracic impedance below reference line 02/06/2015 to 02/11/2015 suggesting fluid retention.  She reported leg swelling during that time and visited her PCP for other reasons but he addressed the leg swelling.  PCP increased Furosemide and she has return appointment this week.  Thoracic impedance above reference line starting 02/12/2015 which correlates to the temporary increase of Furosemide.  Recommended she drink enough fluids, up to 64 oz day, to stay hydrated.   Encouraged her to call if fluid symptoms return in future.  No changes today.    Copy of note sent to patient's primary care physician, primary cardiologist,  and device following physician.  [name], RN, CCM 02/21/2015 11:14 AM

## 2015-02-21 NOTE — Telephone Encounter (Signed)
Spoke with pt and reminded pt of remote transmission that is due today. Pt verbalized understanding.   

## 2015-03-03 ENCOUNTER — Other Ambulatory Visit: Payer: Self-pay

## 2015-03-03 DIAGNOSIS — Z1231 Encounter for screening mammogram for malignant neoplasm of breast: Secondary | ICD-10-CM

## 2015-03-11 ENCOUNTER — Observation Stay (HOSPITAL_COMMUNITY)
Admission: EM | Admit: 2015-03-11 | Discharge: 2015-03-13 | Disposition: A | Discharge status: Home / Self Care | Payer: Medicare HMO | Attending: Internal Medicine | Admitting: Internal Medicine

## 2015-03-11 ENCOUNTER — Encounter (HOSPITAL_COMMUNITY): Payer: Self-pay | Admitting: Emergency Medicine

## 2015-03-11 DIAGNOSIS — Z794 Long term (current) use of insulin: Secondary | ICD-10-CM | POA: Insufficient documentation

## 2015-03-11 DIAGNOSIS — W19XXXA Unspecified fall, initial encounter: Secondary | ICD-10-CM | POA: Insufficient documentation

## 2015-03-11 DIAGNOSIS — Y92000 Kitchen of unspecified non-institutional (private) residence as  the place of occurrence of the external cause: Secondary | ICD-10-CM | POA: Insufficient documentation

## 2015-03-11 DIAGNOSIS — R55 Syncope and collapse: Secondary | ICD-10-CM

## 2015-03-11 DIAGNOSIS — Z791 Long term (current) use of non-steroidal anti-inflammatories (NSAID): Secondary | ICD-10-CM | POA: Diagnosis not present

## 2015-03-11 DIAGNOSIS — Z951 Presence of aortocoronary bypass graft: Secondary | ICD-10-CM | POA: Diagnosis not present

## 2015-03-11 DIAGNOSIS — E162 Hypoglycemia, unspecified: Secondary | ICD-10-CM | POA: Diagnosis not present

## 2015-03-11 DIAGNOSIS — Z6841 Body Mass Index (BMI) 40.0 and over, adult: Secondary | ICD-10-CM | POA: Diagnosis not present

## 2015-03-11 DIAGNOSIS — Z7982 Long term (current) use of aspirin: Secondary | ICD-10-CM | POA: Insufficient documentation

## 2015-03-11 DIAGNOSIS — Z9981 Dependence on supplemental oxygen: Secondary | ICD-10-CM | POA: Diagnosis not present

## 2015-03-11 DIAGNOSIS — I255 Ischemic cardiomyopathy: Secondary | ICD-10-CM | POA: Diagnosis not present

## 2015-03-11 DIAGNOSIS — I2510 Atherosclerotic heart disease of native coronary artery without angina pectoris: Secondary | ICD-10-CM | POA: Diagnosis not present

## 2015-03-11 DIAGNOSIS — I959 Hypotension, unspecified: Secondary | ICD-10-CM

## 2015-03-11 DIAGNOSIS — I5042 Chronic combined systolic (congestive) and diastolic (congestive) heart failure: Secondary | ICD-10-CM | POA: Insufficient documentation

## 2015-03-11 DIAGNOSIS — Z79899 Other long term (current) drug therapy: Secondary | ICD-10-CM | POA: Diagnosis not present

## 2015-03-11 DIAGNOSIS — Z95810 Presence of automatic (implantable) cardiac defibrillator: Secondary | ICD-10-CM | POA: Diagnosis not present

## 2015-03-11 DIAGNOSIS — G4730 Sleep apnea, unspecified: Secondary | ICD-10-CM | POA: Diagnosis not present

## 2015-03-11 DIAGNOSIS — E11649 Type 2 diabetes mellitus with hypoglycemia without coma: Secondary | ICD-10-CM | POA: Diagnosis not present

## 2015-03-11 DIAGNOSIS — N179 Acute kidney failure, unspecified: Secondary | ICD-10-CM

## 2015-03-11 DIAGNOSIS — I110 Hypertensive heart disease with heart failure: Secondary | ICD-10-CM | POA: Insufficient documentation

## 2015-03-11 DIAGNOSIS — I447 Left bundle-branch block, unspecified: Secondary | ICD-10-CM | POA: Diagnosis not present

## 2015-03-11 LAB — CBC
HEMATOCRIT: 43.1 % (ref 36.0–46.0)
HEMOGLOBIN: 14.7 g/dL (ref 12.0–15.0)
MCH: 32.4 pg (ref 26.0–34.0)
MCHC: 34.1 g/dL (ref 30.0–36.0)
MCV: 94.9 fL (ref 78.0–100.0)
Platelets: 217 10*3/uL (ref 150–400)
RBC: 4.54 MIL/uL (ref 3.87–5.11)
RDW: 14.5 % (ref 11.5–15.5)
WBC: 7.4 10*3/uL (ref 4.0–10.5)

## 2015-03-11 LAB — BASIC METABOLIC PANEL WITH GFR
Anion gap: 11 (ref 5–15)
BUN: 33 mg/dL — ABNORMAL HIGH (ref 6–20)
CO2: 28 mmol/L (ref 22–32)
Calcium: 10.7 mg/dL — ABNORMAL HIGH (ref 8.9–10.3)
Chloride: 99 mmol/L — ABNORMAL LOW (ref 101–111)
Creatinine, Ser: 1.84 mg/dL — ABNORMAL HIGH (ref 0.44–1.00)
GFR calc Af Amer: 32 mL/min — ABNORMAL LOW
GFR calc non Af Amer: 28 mL/min — ABNORMAL LOW
Glucose, Bld: 101 mg/dL — ABNORMAL HIGH (ref 65–99)
Potassium: 5 mmol/L (ref 3.5–5.1)
Sodium: 138 mmol/L (ref 135–145)

## 2015-03-11 LAB — I-STAT TROPONIN, ED: Troponin i, poc: 0.01 ng/mL (ref 0.00–0.08)

## 2015-03-11 LAB — CBG MONITORING, ED
GLUCOSE-CAPILLARY: 84 mg/dL (ref 65–99)
GLUCOSE-CAPILLARY: 151 mg/dL — AB (ref 65–99)

## 2015-03-11 MED ORDER — SODIUM CHLORIDE 0.9 % IV BOLUS (SEPSIS)
500.0000 mL | Freq: Once | INTRAVENOUS | Status: AC
  Administered 2015-03-11: 500 mL via INTRAVENOUS

## 2015-03-11 NOTE — ED Notes (Signed)
Admitting MD

## 2015-03-11 NOTE — H&P (Signed)
[facility] History and Physical  [name] [id number] DOB: Nov 12, 1950 DOA: 03/11/2015  PCP: Imelda Pillow, NP  Specialists: Dr. Marlou Porch, Cardiology  Chief Complaint: Found down after having hypoglycemic episode  HPI: [name] is a 65 y.o. woman with a history of CAD S/P CABG, ischemic cardiomyopathy S/P ICD placement, DM with hypoglycemic awareness, HTN, and chronic O2 dependence 2L [place] at baseline who presents to the ED accompanied by two of her children after being found down in her home.  The patient recalls feeling light-headed, which she attributed to having a low blood sugar.  She treated her blood sugar by eating a piece of banana and a cookie.  She recalls being in her kitchen with a cookie in her hand and ultimately waking up on the floor of her kitchen.   She was not disoriented, but she didn't remember falling.  Her family estimates that she may have down over an hour.  No bowel or bladder incontinence noted.  She adamantly denies chest pain, pressure, or tightness.  No palpitations or defibrillator shocks.  Denies preceding headache.  No head pain or headache since the fall.  No blurred or double vision.  No fever.  No congestion.  She had an episode of vomiting one time today.  No diarrhea.  No abdominal pain.  No history of syncope.    Her family reports that her oral intake has been poor.  There have also been recent adjustments to her diuretics because there was concern that she was "too dry".  She has intermittent ankle edema.  She does not recall the last time she had an echo.  The last one in this system was in 2014.  ED evaluation concerning for AKI, relative hypotension (responsive to IV fluids).  Device interrogation is pending.  Hospitalist asked to admit to telemetry for observation.  Review of Systems: 12 systems reviewed and negative except as stated in HPI.  Past Medical History  Diagnosis Date  . Coronary artery disease     s/p CABG 2007  .  Sleep apnea     uses O2 at night  . Ischemic cardiomyopathy     EF 30-35%, s/p ICD 4/08 by Dr Leonia Reeves  . Diabetes mellitus     type II  . Hypertension   . Obesity   . CHF (congestive heart failure) ([place])   . Oxygen dependent 2 L  . Morbid obesity ([place]) 11/06/2012  . Angina decubitus ([employer]) 11/06/2012  . 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 Chronic combined systolic and diastolic CHF, NYHA class 3 ([place])   . Angina pectoris ([place])   . Ventricular tachycardia ([place]) 01/16/15    sustained VT terminated with ATP, CL 340 msec   Past Surgical History  Procedure Laterality Date  . Cardiac catheterization    . Cardiac defibrillator placement  4/08    by Dr Leonia Reeves ([facility])  . Coronary artery bypass graft  10/05/2005    by Dr Cyndia Bent  . Implantable cardioverter defibrillator implant  09/25/12    attempt of upgrade to CRT-D unsuccessful due to CS anatomy, SJM Unify Asaura device placed with LV port capped by Dr Rayann Heman  . Bi-ventricular implantable cardioverter defibrillator upgrade N/A 09/25/2012    Procedure: BI-VENTRICULAR IMPLANTABLE CARDIOVERTER DEFIBRILLATOR UPGRADE;  Surgeon: Coralyn Mark, MD;  Location: [facility] CATH LAB;  Service: Cardiovascular;  Laterality:  N/A;   Social History:  Social History   Social History Narrative   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  No tobacco, EtOH, or illicit drug use.  She is separated.  She has three living children; one child is deceased.  Allergies  Allergen Reactions  . Metformin And Related Itching, Swelling and Other (See Comments)    Leg pain & swelling in legs  . Atorvastatin Itching and Rash  . Levofloxacin Itching    Family History  Problem Relation Age of Onset  . Diabetes Mother 71    died - HTN  . Other      No early family hx of CAD   Prior to Admission medications   Medication Sig Start Date End Date Taking?  Authorizing Provider  albuterol (PROVENTIL HFA;VENTOLIN HFA) 108 (90 BASE) MCG/ACT inhaler Inhale 2 puffs into the lungs every 6 (six) hours as needed for wheezing or shortness of breath.   Yes Historical Provider, MD  amitriptyline (ELAVIL) 10 MG tablet Take 10 mg by mouth at bedtime as needed for sleep.  02/10/15  Yes Historical Provider, MD  aspirin EC 81 MG EC tablet Take 1 tablet (81 mg total) by mouth daily. 09/11/12  Yes Jerline Pain, MD  carvedilol (COREG) 25 MG tablet Take 1 tablet (25 mg total) by mouth 2 (two) times daily with a meal. 05/11/14  Yes Jerline Pain, MD  Choline Fenofibrate (FENOFIBRIC ACID) 135 MG CPDR Take 135 mg by mouth daily. 02/09/15  Yes Historical Provider, MD  furosemide (LASIX) 40 MG tablet Take 1.5 tablets (60 mg total) by mouth 2 (two) times daily. 05/30/14  Yes Barton Dubois, MD  glipiZIDE (GLUCOTROL) 5 MG tablet Take 2.5 mg by mouth daily before breakfast.   Yes Historical Provider, MD  HYDROcodone-acetaminophen (NORCO) 5-325 MG per tablet Take 1 tablet by mouth every 6 (six) hours as needed for severe pain. 06/04/14  Yes Julianne Rice, MD  insulin lispro (HUMALOG) 100 UNIT/ML injection Inject 50-100 Units into the skin 3 (three) times daily with meals. Use 100units in am, 50 units for lunch and 50 units in pm   Yes Historical Provider, MD  Iron-Vitamin C (IRON 100/C PO) Take 1 tablet by mouth every other day.    Yes Historical Provider, MD  isosorbide dinitrate (ISORDIL) 30 MG tablet Take 1 tablet (30 mg total) by mouth daily. 05/30/14  Yes Barton Dubois, MD  latanoprost (XALATAN) 0.005 % ophthalmic solution Place 1 drop into both eyes at bedtime.   Yes Historical Provider, MD  lisinopril (PRINIVIL,ZESTRIL) 10 MG tablet Take 20 mg by mouth daily.   Yes Historical Provider, MD  meloxicam (MOBIC) 7.5 MG tablet Take 7.5 mg by mouth daily. 02/10/15  Yes Historical Provider, MD  methocarbamol (ROBAXIN) 500 MG tablet Take 1 tablet (500 mg total) by mouth every 8 (eight) hours as  needed for muscle spasms. 05/30/14  Yes Barton Dubois, MD  potassium phosphate, monobasic, (K-PHOS ORIGINAL) 500 MG tablet Take 500 mg by mouth daily as needed (takes when needed for spasms).    Yes Historical Provider, MD  spironolactone (ALDACTONE) 25 MG tablet Take 1 tablet (25 mg total) by mouth daily. 05/11/14  Yes Jerline Pain, MD  fluconazole (DIFLUCAN) 150 MG tablet Take 150 mg by mouth once a week. Reported on 03/11/2015 03/01/15   Historical Provider, MD  metroNIDAZOLE (FLAGYL) 500 MG tablet Take 500 mg by mouth 2 (two) times daily. Reported on 03/11/2015 03/01/15   Historical  Provider, MD   Physical Exam: Filed Vitals:   03/11/15 2042 03/11/15 2130 03/11/15 2133 03/11/15 2242  BP: 101/46 102/61  98/62  Pulse: 67 61    Temp:      TempSrc:      Resp: 16 24    SpO2: 98% 92% 90%     General:  Awake and alert.  Oriented to person, place, time and situation.  NAD.  Eyes: PERRL bilaterally, conjunctiva are pink.  EOMI.  ENT: Moist mucous membranes.  No nasal drainage.  Neck: Supple.    Cardiovascular: NR/RR.  No LE edema.  Respiratory: CTA bilaterally.  Abdomen: Soft/NT/ND.  Bowel sounds are present.  No guarding.  Skin: Very dry.  Musculoskeletal: Moves all four extremities spontaneously.  Psychiatric: Normal affect.  Neurologic: No focal deficits.   Labs on Admission:  Basic Metabolic Panel:  Recent Labs Lab 03/11/15 2045  NA 138  K 5.0  CL 99*  CO2 28  GLUCOSE 101*  BUN 33*  CREATININE 1.84*  CALCIUM 10.7*   CBC:  Recent Labs Lab 03/11/15 2045  WBC 7.4  HGB 14.7  HCT 43.1  MCV 94.9  PLT 217   Cardiac Enzymes: Troponin negative.  BNP (last 3 results)  Recent Labs  05/25/14 2056 06/04/14 0427  BNP 362.5* 141.6*    CBG:  Recent Labs Lab 03/11/15 2011  GLUCAP 84    EKG: Independently reviewed. LBBB block is not new.  Assessment/Plan Principal Problem:   Syncope Active Problems:   Ischemic cardiomyopathy   Hypoglycemia   LBBB  (left bundle branch block)   AKI (acute kidney injury) (HCC)   Hypotension   Syncope and collapse  Admit to telemetry  Syncope and collapse with AKI and relative hypotension --ICD interrogation pending --Check orthostatics upon arrival to the floor --Agree with cautious IV fluid resuscitation.  A total of 1L given in the ED.  Will continue NS at 50cc/hr for an additional 10 hours for a total of 1500cc. --Holding all anti-hypertensives and diuretics until blood pressure recovers. --Does not appear septic but will check U/A and chest xray to look for occult infection --Serial troponin --Will check a CPK level to make sure rhabdo is not contributing --Complete echo in the morning --Repeat BMP in the morning.  DM with hypoglycemia --Hold glipizide for now --Low dose SSI coverage AC/HS  Code Status: FULL Family Communication: Two of her children at bedside at time of admission Disposition Plan: Discharge depends on response to initial therapy and results of cardiac tests.  Time spent: 50 minutes  [employer] [facility]  03/11/2015, 11:29 PM

## 2015-03-11 NOTE — ED Notes (Signed)
Per tech from [facility], no abnormal activity noted to defibrillator

## 2015-03-11 NOTE — ED Notes (Signed)
Pt's daughter found pt lying in the floor this afternoon. Pt denies pain, c/o nausea and emesis x 1, denies chills or fever. Pt reports feeling dizzy and lightheaded. Pt is diabetic and reports fluctuating CBGs at home

## 2015-03-11 NOTE — ED Provider Notes (Signed)
CSN: [id number]     Arrival date & time 03/11/15  2006 History   First MD Initiated Contact with Patient 03/11/15 2110     Chief Complaint  Patient presents with  . Hypoglycemia  . Fall     (Consider location/radiation/quality/duration/timing/severity/associated sxs/prior Treatment) HPI Comments: Patient with history of diabetes reports occasional episodes of hypoglycemia that she is able to recognize and treat.  During today's episode, patient also had syncope resulting in a fall in the kitchen. She does not recall the fall.  No apparent injury from the fall--no head trauma, neck or new back pain, no hip or extremity pain. She denies recent illness or decreased po intake. No chest pain, shortness of breath, nausea, or vomiting.  Patient is a 65 y.o. female presenting with hypoglycemia and fall. The history is provided by the patient. No language interpreter was used.  Hypoglycemia Severity:  Mild Onset quality:  Sudden Timing:  Sporadic Chronicity:  Recurrent Diabetic status:  Controlled with oral medications and controlled with insulin Current diabetic therapy:  Glipizide, humalog Context: not decreased oral intake, not diet changes, not recent illness and not treatment noncompliance   Associated symptoms: no shortness of breath and no vomiting   Fall Associated symptoms include fatigue. Pertinent negatives include no abdominal pain, coughing, nausea, neck pain or vomiting.    Past Medical History  Diagnosis Date  . Coronary artery disease     s/p CABG 2007  . Sleep apnea     uses O2 at night  . Ischemic cardiomyopathy     EF 30-35%, s/p ICD 4/08 by Dr Leonia Reeves  . Diabetes mellitus     type II  . Hypertension   . Obesity   . CHF (congestive heart failure) ([place])   . Oxygen dependent 2 L  . Morbid obesity ([place]) 11/06/2012  . Angina decubitus ([place]) 11/06/2012  . 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    LIMA to D2, SVG to ramus intermediate, sequential SVG to OM1-OM2, SVG to RCA with EVH via  both legs   . Chronic combined systolic and diastolic CHF, NYHA class 3 ([place])   . Angina pectoris ([place])   . Ventricular tachycardia ([place]) 01/16/15    sustained VT terminated with ATP, CL 340 msec   Past Surgical History  Procedure Laterality Date  . Cardiac catheterization    . Cardiac defibrillator placement  4/08    by Dr Leonia Reeves ([facility])  . Coronary artery bypass graft  10/05/2005    by Dr Cyndia Bent  . Implantable cardioverter defibrillator implant  09/25/12    attempt of upgrade to CRT-D unsuccessful due to CS anatomy, SJM Unify [place] device placed with LV port capped by Dr Rayann Heman  . Bi-ventricular implantable cardioverter defibrillator upgrade N/A 09/25/2012    Procedure: BI-VENTRICULAR IMPLANTABLE CARDIOVERTER DEFIBRILLATOR UPGRADE;  Surgeon: Coralyn Mark, MD;  Location: [facility] CATH LAB;  Service: Cardiovascular;  Laterality: N/A;   Family History  Problem Relation Age of Onset  . Diabetes Mother 65    died - HTN  . Other      No early family hx of CAD   Social History  Substance Use Topics  . Smoking status: Never Smoker   . Smokeless tobacco: None  . Alcohol Use: No   OB History    No data available     Review of Systems  Constitutional: Positive for fatigue.  Respiratory: Negative for cough and shortness of breath.   Gastrointestinal: Negative for nausea, vomiting and abdominal pain.  Genitourinary:  Negative for dysuria.  Musculoskeletal: Positive for back pain. Negative for neck pain.  All other systems reviewed and are negative.     Allergies  Metformin and related; Atorvastatin; and Levofloxacin  Home Medications   Prior to Admission medications   Medication Sig Start Date End Date Taking? Authorizing Provider  albuterol (PROVENTIL HFA;VENTOLIN HFA) 108 (90 BASE) MCG/ACT inhaler Inhale 2 puffs into the lungs every 6 (six) hours as needed for wheezing or shortness of breath.   Yes Historical Provider, MD  amitriptyline (ELAVIL) 10 MG tablet Take 10 mg by mouth at  bedtime as needed. 02/10/15  Yes Historical Provider, MD  aspirin EC 81 MG EC tablet Take 1 tablet (81 mg total) by mouth daily. 09/11/12  Yes Jerline Pain, MD  carvedilol (COREG) 25 MG tablet Take 1 tablet (25 mg total) by mouth 2 (two) times daily with a meal. 05/11/14  Yes Jerline Pain, MD  Choline Fenofibrate (FENOFIBRIC ACID) 135 MG CPDR Take 135 mg by mouth daily. 02/09/15  Yes Historical Provider, MD  furosemide (LASIX) 40 MG tablet Take 1.5 tablets (60 mg total) by mouth 2 (two) times daily. 05/30/14  Yes Barton Dubois, MD  glipiZIDE (GLUCOTROL) 5 MG tablet Take 2.5 mg by mouth daily before breakfast.   Yes Historical Provider, MD  GLIPIZIDE PO Take 1 tablet by mouth daily.   Yes Historical Provider, MD  HYDROcodone-acetaminophen (NORCO) 5-325 MG per tablet Take 1 tablet by mouth every 6 (six) hours as needed for severe pain. 06/04/14  Yes Julianne Rice, MD  insulin lispro (HUMALOG) 100 UNIT/ML injection Inject 50-100 Units into the skin 3 (three) times daily with meals. Use 100units in am, 50 units for lunch and 50 untis in pm   Yes Historical Provider, MD  Iron-Vitamin C (IRON 100/C PO) Take 1 tablet by mouth every other day.    Yes Historical Provider, MD  isosorbide dinitrate (ISORDIL) 30 MG tablet Take 1 tablet (30 mg total) by mouth daily. 05/30/14  Yes Barton Dubois, MD  latanoprost (XALATAN) 0.005 % ophthalmic solution Place 1 drop into both eyes at bedtime.   Yes Historical Provider, MD  lisinopril (PRINIVIL,ZESTRIL) 10 MG tablet Take 20 mg by mouth daily.   Yes Historical Provider, MD  meloxicam (MOBIC) 7.5 MG tablet Take 7.5 mg by mouth daily. 02/10/15  Yes Historical Provider, MD  methocarbamol (ROBAXIN) 500 MG tablet Take 1 tablet (500 mg total) by mouth every 8 (eight) hours as needed for muscle spasms. 05/30/14  Yes Barton Dubois, MD  potassium phosphate, monobasic, (K-PHOS ORIGINAL) 500 MG tablet Take 500 mg by mouth daily as needed (takes when needed for spasms).    Yes Historical  Provider, MD  spironolactone (ALDACTONE) 25 MG tablet Take 1 tablet (25 mg total) by mouth daily. 05/11/14  Yes Jerline Pain, MD  fluconazole (DIFLUCAN) 150 MG tablet Take 150 mg by mouth once a week. 03/01/15   Historical Provider, MD  metroNIDAZOLE (FLAGYL) 500 MG tablet Take 500 mg by mouth 2 (two) times daily. 03/01/15   Historical Provider, MD   BP 101/46 mmHg  Pulse 67  Temp(Src) 97.7 F (36.5 C) (Oral)  Resp 16  SpO2 98% Physical Exam  Constitutional: She is oriented to person, place, and time. She appears well-developed and well-nourished.  HENT:  Head: Normocephalic and atraumatic.  Eyes: Pupils are equal, round, and reactive to light.  Neck: Normal range of motion. Neck supple.  Cardiovascular: Normal rate, regular rhythm and intact distal pulses.  Pulmonary/Chest: Effort normal and breath sounds normal.  Abdominal: Soft. Bowel sounds are normal. She exhibits no distension. There is no tenderness.  Musculoskeletal: Normal range of motion. She exhibits no edema.  Neurological: She is alert and oriented to person, place, and time. She has normal strength. No cranial nerve deficit or sensory deficit.  Skin: Skin is warm and dry.  Psychiatric: She has a normal mood and affect.  Nursing note and vitals reviewed.   ED Course  Procedures (including critical care time) Labs Review Labs Reviewed  BASIC METABOLIC PANEL - Abnormal; Notable for the following:    Chloride 99 (*)    Glucose, Bld 101 (*)    BUN 33 (*)    Creatinine, Ser 1.84 (*)    Calcium 10.7 (*)    GFR calc non Af Amer 28 (*)    GFR calc Af Amer 32 (*)    All other components within normal limits  CBC  URINALYSIS, ROUTINE W REFLEX MICROSCOPIC (NOT AT [facility])  CBG MONITORING, ED  CBG MONITORING, ED  I-STAT TROPOININ, ED    Imaging Review No results found. I have personally reviewed and evaluated these images and lab results as part of my medical decision-making.   EKG Interpretation   Date/Time:  Friday  March 11 2015 20:32:35 EST Ventricular Rate:  66 PR Interval:  199 QRS Duration: 165 QT Interval:  471 QTC Calculation: 493 R Axis:   -45 Text Interpretation:  Sinus rhythm Left bundle branch block agree. no  change from old Confirmed by Johnney Killian, MD, Jeannie Done [phone]) on 03/11/2015  10:14:11 PM     Patient discussed with Dr. Johnney Killian. Syncope with no prodromal symptoms.  Mild renal insufficiency. Given IV fluids in ED. Will request admission.   ICD interrogated. No abnormal activity MDM   Final diagnoses:  None    Syncope. Renal insufficiency. Admitted to hospitalist.    Etta Quill, NP 03/12/15 0010  Charlesetta Shanks, MD 03/20/15 2244

## 2015-03-11 NOTE — ED Notes (Signed)
Per patients family member, pt was found at home on the floor. Pt states "Im a diabetic and my blood sugar got low. I remember sitting up and then all of a sudden i was out." Pt was down between 1730 and 1900. Non witnessed fall. Pt AAOX4, denies pain, CBG was 141 at 7pm. CBG 84 now. Pt hypotensive, BP 74/47.

## 2015-03-11 NOTE — ED Notes (Signed)
Pt has sleep apnea. Sleeps with O2.

## 2015-03-12 ENCOUNTER — Encounter (HOSPITAL_COMMUNITY): Payer: Self-pay | Admitting: General Practice

## 2015-03-12 ENCOUNTER — Observation Stay (HOSPITAL_COMMUNITY): Payer: Medicare HMO

## 2015-03-12 DIAGNOSIS — R55 Syncope and collapse: Secondary | ICD-10-CM | POA: Diagnosis not present

## 2015-03-12 DIAGNOSIS — I447 Left bundle-branch block, unspecified: Secondary | ICD-10-CM

## 2015-03-12 DIAGNOSIS — E162 Hypoglycemia, unspecified: Secondary | ICD-10-CM | POA: Diagnosis not present

## 2015-03-12 DIAGNOSIS — E11649 Type 2 diabetes mellitus with hypoglycemia without coma: Secondary | ICD-10-CM | POA: Diagnosis not present

## 2015-03-12 DIAGNOSIS — I255 Ischemic cardiomyopathy: Secondary | ICD-10-CM | POA: Diagnosis not present

## 2015-03-12 DIAGNOSIS — N179 Acute kidney failure, unspecified: Secondary | ICD-10-CM | POA: Diagnosis not present

## 2015-03-12 LAB — URINALYSIS, ROUTINE W REFLEX MICROSCOPIC
BILIRUBIN URINE: NEGATIVE
Glucose, UA: 250 mg/dL — AB
HGB URINE DIPSTICK: NEGATIVE
Ketones, ur: NEGATIVE mg/dL
NITRITE: NEGATIVE
PH: 6 (ref 5.0–8.0)
Protein, ur: NEGATIVE mg/dL
SPECIFIC GRAVITY, URINE: 1.013 (ref 1.005–1.030)

## 2015-03-12 LAB — URINE MICROSCOPIC-ADD ON

## 2015-03-12 LAB — BASIC METABOLIC PANEL
Anion gap: 13 (ref 5–15)
BUN: 38 mg/dL — ABNORMAL HIGH (ref 6–20)
CHLORIDE: 100 mmol/L — AB (ref 101–111)
CO2: 24 mmol/L (ref 22–32)
CREATININE: 2.05 mg/dL — AB (ref 0.44–1.00)
Calcium: 9.8 mg/dL (ref 8.9–10.3)
GFR calc non Af Amer: 24 mL/min — ABNORMAL LOW (ref >60)
GFR, EST AFRICAN AMERICAN: 28 mL/min — AB (ref >60)
Glucose, Bld: 214 mg/dL — ABNORMAL HIGH (ref 65–99)
POTASSIUM: 4.9 mmol/L (ref 3.5–5.1)
SODIUM: 137 mmol/L (ref 135–145)

## 2015-03-12 LAB — GLUCOSE, CAPILLARY
GLUCOSE-CAPILLARY: 108 mg/dL — AB (ref 65–99)
GLUCOSE-CAPILLARY: 272 mg/dL — AB (ref 65–99)
GLUCOSE-CAPILLARY: 309 mg/dL — AB (ref 65–99)
GLUCOSE-CAPILLARY: 297 mg/dL — AB (ref 65–99)

## 2015-03-12 LAB — TSH: TSH: 1.786 u[IU]/mL (ref 0.350–4.500)

## 2015-03-12 LAB — CK TOTAL AND CKMB (NOT AT ARMC)
CK TOTAL: 71 U/L (ref 38–234)
CK, MB: 1 ng/mL (ref 0.5–5.0)
Relative Index: INVALID (ref 0.0–2.5)

## 2015-03-12 LAB — TROPONIN I
Troponin I: 0.03 ng/mL (ref <0.031)
Troponin I: <0.03 ng/mL (ref <0.031)

## 2015-03-12 MED ORDER — ACETAMINOPHEN 650 MG RE SUPP: 650.0000 mg | Freq: Four times a day (QID) | RECTAL | Status: DC | PRN

## 2015-03-12 MED ORDER — METHOCARBAMOL 500 MG PO TABS: 500.0000 mg | ORAL_TABLET | Freq: Three times a day (TID) | ORAL | Status: DC | PRN

## 2015-03-12 MED ORDER — SODIUM CHLORIDE 0.9% FLUSH
3.0000 mL | Freq: Two times a day (BID) | INTRAVENOUS | Status: DC
  Administered 2015-03-12 – 2015-03-13 (×3): 3 mL via INTRAVENOUS

## 2015-03-12 MED ORDER — LATANOPROST 0.005 % OP SOLN
1.0000 [drp] | Freq: Every day | OPHTHALMIC | Status: DC
  Administered 2015-03-12: 1 [drp] via OPHTHALMIC
  Filled 2015-03-12 (×2): qty 2.5

## 2015-03-12 MED ORDER — ASPIRIN EC 81 MG PO TBEC
81.0000 mg | DELAYED_RELEASE_TABLET | Freq: Every day | ORAL | Status: DC
  Administered 2015-03-12 – 2015-03-13 (×2): 81 mg via ORAL
  Filled 2015-03-12 (×2): qty 1

## 2015-03-12 MED ORDER — SODIUM CHLORIDE 0.9 % IV SOLN
INTRAVENOUS | Status: AC
Start: 2015-03-12 — End: 2015-03-12

## 2015-03-12 MED ORDER — ACETAMINOPHEN 325 MG PO TABS: 650.0000 mg | ORAL_TABLET | Freq: Four times a day (QID) | ORAL | Status: DC | PRN

## 2015-03-12 MED ORDER — INSULIN ASPART 100 UNIT/ML ~~LOC~~ SOLN
0.0000 [IU] | Freq: Three times a day (TID) | SUBCUTANEOUS | Status: DC
  Administered 2015-03-12: 7 [IU] via SUBCUTANEOUS
  Administered 2015-03-12: 5 [IU] via SUBCUTANEOUS
  Administered 2015-03-13 (×2): 3 [IU] via SUBCUTANEOUS

## 2015-03-12 MED ORDER — AMITRIPTYLINE HCL 10 MG PO TABS: 10.0000 mg | ORAL_TABLET | Freq: Every evening | ORAL | Status: DC | PRN

## 2015-03-12 MED ORDER — HYDROCODONE-ACETAMINOPHEN 5-325 MG PO TABS: 1.0000 | ORAL_TABLET | Freq: Four times a day (QID) | ORAL | Status: DC | PRN

## 2015-03-12 MED ORDER — ALBUTEROL SULFATE (2.5 MG/3ML) 0.083% IN NEBU: 2.5000 mg | INHALATION_SOLUTION | Freq: Four times a day (QID) | RESPIRATORY_TRACT | Status: DC | PRN

## 2015-03-12 MED ORDER — SODIUM CHLORIDE 0.9 % IV SOLN
INTRAVENOUS | Status: DC
  Administered 2015-03-12: 17:00:00 via INTRAVENOUS

## 2015-03-12 NOTE — Progress Notes (Signed)
1643 Received a call to Dr. Wyline Copas to  Restart iv R/t Bun / Creatinne results today Order done as instructed

## 2015-03-12 NOTE — ED Notes (Signed)
Attempted report x1. 

## 2015-03-12 NOTE — Progress Notes (Signed)
[facility] PROGRESS NOTE  [name] [id number] DOB: 1951-01-03 DOA: 03/11/2015 PCP: Imelda Pillow, NP  HPI/Brief narrative 65 y.o. woman with a history of CAD S/P CABG, ischemic cardiomyopathy S/P ICD placement, DM with hypoglycemic awareness, HTN, and chronic O2 dependence 2L [place] at baseline who presents to the ED accompanied by two of her children after being found down in her home. The patient recalls feeling light-headed, which she attributed to having a low blood sugar.   Assessment/Plan: Syncope and collapse with AKI and relative hypotension --ICD interrogation pending --Orthostatic vitals pending --Continued on cautious IV fluid resuscitation. A total of 1L given in the ED. Will continue NS at 50cc/hr for an additional 10 hours for a total of 1500cc. --Holding all anti-hypertensives and diuretics until blood pressure recovers. --Serial troponin thus far neg --Complete echo ordered, pending --Follow renal panel.  DM with hypoglycemia --Holding glipizide for now --Low dose SSI coverage AC/HS while admitted  ARF --Cr up to 2.05 this AM --Continue gentle IVF hydration --Cont to follow renal function closely  Code Status: Full Family Communication: Pt in room Disposition Plan: Possible in 24-48hrs   Consultants:    Procedures:    Antibiotics: Anti-infectives    None      HPI/Subjective: Eager to go home  Objective: Filed Vitals:   03/12/15 1600 03/12/15 1605 03/12/15 1610 03/12/15 1620  BP: 117/39 132/56 132/55 124/66  Pulse: 56 57 61 61  Temp:      TempSrc:      Resp: 18 18 18 18   Height:      Weight:      SpO2: 100%       Intake/Output Summary (Last 24 hours) at 03/12/15 1626 Last data filed at 03/12/15 0956  Gross per 24 hour  Intake    740 ml  Output    100 ml  Net    640 ml   Filed Weights   03/12/15 0124  Weight: 113.944 kg (251 lb 3.2 oz)    Exam:   General:  Awake, in nad  Cardiovascular: regular, s1,  s2  Respiratory: normal resp effort, no wheezing  Abdomen: soft,nondistended  Musculoskeletal: perfused, no clubbing   Data Reviewed: Basic Metabolic Panel:  Recent Labs Lab 03/11/15 2045 03/12/15 0908  NA 138 137  K 5.0 4.9  CL 99* 100*  CO2 28 24  GLUCOSE 101* 214*  BUN 33* 38*  CREATININE 1.84* 2.05*  CALCIUM 10.7* 9.8   Liver Function Tests: No results for input(s): AST, ALT, ALKPHOS, BILITOT, PROT, ALBUMIN in the last 168 hours. No results for input(s): LIPASE, AMYLASE in the last 168 hours. No results for input(s): AMMONIA in the last 168 hours. CBC:  Recent Labs Lab 03/11/15 2045  WBC 7.4  HGB 14.7  HCT 43.1  MCV 94.9  PLT 217   Cardiac Enzymes:  Recent Labs Lab 03/12/15 0217 03/12/15 0908  CKTOTAL 71  --   CKMB 1.0  --   TROPONINI <0.03 0.03   BNP (last 3 results)  Recent Labs  05/25/14 2056 06/04/14 0427  BNP 362.5* 141.6*    ProBNP (last 3 results) No results for input(s): PROBNP in the last 8760 hours.  CBG:  Recent Labs Lab 03/11/15 2011 03/11/15 2351 03/12/15 0132 03/12/15 1336  GLUCAP 84 151* 108* 272*    No results found for this or any previous visit (from the past 240 hour(s)).   Studies: Portable Chest 1 View  03/12/2015  CLINICAL DATA:  Syncope and collapse  EXAM: PORTABLE CHEST 1 VIEW COMPARISON:  Jun 04, 2014 FINDINGS: Cardiomegaly remains. No pneumothorax. A left AICD device is again seen. No acute abnormalities otherwise noted. IMPRESSION: No active disease. Electronically Signed   By: Dorise Bullion III M.D   On: 03/12/2015 09:37    Scheduled Meds: . aspirin EC  81 mg Oral Daily  . insulin aspart  0-9 Units Subcutaneous TID WC  . latanoprost  1 drop Both Eyes QHS  . sodium chloride flush  3 mL Intravenous Q12H   Continuous Infusions: . sodium chloride      Principal Problem:   Syncope Active Problems:   Ischemic cardiomyopathy   Hypoglycemia   LBBB (left bundle branch block)   AKI (acute kidney  injury) ([place])   Hypotension   Syncope and collapse   CHIU, STEPHEN K  [facility] Pager [phone]. If 7PM-7AM, please contact night-coverage at [email or web address], password [facility] 03/12/2015, 4:26 PM

## 2015-03-13 ENCOUNTER — Observation Stay (HOSPITAL_BASED_OUTPATIENT_CLINIC_OR_DEPARTMENT_OTHER): Payer: Medicare HMO

## 2015-03-13 ENCOUNTER — Encounter (HOSPITAL_COMMUNITY): Payer: Self-pay | Admitting: *Deleted

## 2015-03-13 DIAGNOSIS — N179 Acute kidney failure, unspecified: Secondary | ICD-10-CM | POA: Diagnosis not present

## 2015-03-13 DIAGNOSIS — I255 Ischemic cardiomyopathy: Secondary | ICD-10-CM | POA: Diagnosis not present

## 2015-03-13 DIAGNOSIS — E11649 Type 2 diabetes mellitus with hypoglycemia without coma: Secondary | ICD-10-CM | POA: Diagnosis not present

## 2015-03-13 DIAGNOSIS — R55 Syncope and collapse: Secondary | ICD-10-CM

## 2015-03-13 LAB — CBC
HCT: 39.1 % (ref 36.0–46.0)
Hemoglobin: 12.8 g/dL (ref 12.0–15.0)
MCH: 30.8 pg (ref 26.0–34.0)
MCHC: 32.7 g/dL (ref 30.0–36.0)
MCV: 94.2 fL (ref 78.0–100.0)
PLATELETS: 173 10*3/uL (ref 150–400)
RBC: 4.15 MIL/uL (ref 3.87–5.11)
RDW: 14.2 % (ref 11.5–15.5)
WBC: 5.7 10*3/uL (ref 4.0–10.5)

## 2015-03-13 LAB — BASIC METABOLIC PANEL
Anion gap: 10 (ref 5–15)
BUN: 24 mg/dL — AB (ref 6–20)
CO2: 28 mmol/L (ref 22–32)
CREATININE: 1.44 mg/dL — AB (ref 0.44–1.00)
Calcium: 9.8 mg/dL (ref 8.9–10.3)
Chloride: 100 mmol/L — ABNORMAL LOW (ref 101–111)
GFR calc Af Amer: 43 mL/min — ABNORMAL LOW (ref >60)
GFR, EST NON AFRICAN AMERICAN: 37 mL/min — AB (ref >60)
GLUCOSE: 261 mg/dL — AB (ref 65–99)
POTASSIUM: 4.9 mmol/L (ref 3.5–5.1)
SODIUM: 138 mmol/L (ref 135–145)

## 2015-03-13 LAB — GLUCOSE, CAPILLARY
GLUCOSE-CAPILLARY: 221 mg/dL — AB (ref 65–99)
Glucose-Capillary: 243 mg/dL — ABNORMAL HIGH (ref 65–99)

## 2015-03-13 MED ORDER — PERFLUTREN LIPID MICROSPHERE
1.0000 mL | INTRAVENOUS | Status: AC | PRN
  Administered 2015-03-13: 2 mL via INTRAVENOUS
  Filled 2015-03-13: qty 10

## 2015-03-13 MED ORDER — PERFLUTREN LIPID MICROSPHERE
INTRAVENOUS | Status: AC
  Administered 2015-03-13: 10:00:00
  Filled 2015-03-13: qty 10

## 2015-03-13 NOTE — Progress Notes (Signed)
Pt fluids stopped due to loss of IV access and machine continuously beeping , new IV access by IV team and fluids restarted.

## 2015-03-13 NOTE — Progress Notes (Signed)
Discharged instructions given to pt  .Verbalixed understanding

## 2015-03-13 NOTE — Discharge Summary (Signed)
Physician Discharge Summary  [name] [id number] DOB: 03-03-1950 DOA: 03/11/2015  PCP: Imelda Pillow, NP  Admit date: 03/11/2015 Discharge date: 03/13/2015  Time spent: 20 minutes  Recommendations for Outpatient Follow-up:  1. Follow up with PCP in 2-3 weeks 2. Follow up with Cardiology as scheduled   Discharge Diagnoses:  Principal Problem:   Syncope Active Problems:   Ischemic cardiomyopathy   Hypoglycemia   LBBB (left bundle branch block)   AKI (acute kidney injury) ([place])   Hypotension   Syncope and collapse   Discharge Condition: Improved  Diet recommendation: Heart healthy, diabetic  Filed Weights   03/12/15 0124 03/13/15 0436  Weight: 113.944 kg (251 lb 3.2 oz) 115.758 kg (255 lb 3.2 oz)    History of present illness:  Please review dictated H and P from 3/3 for details. Briefly, 65 y.o. woman with a history of CAD S/P CABG, ischemic cardiomyopathy S/P ICD placement, DM with hypoglycemic awareness, HTN, and chronic O2 dependence 2L [place] at baseline who presents to the ED accompanied by two of her children after being found down in her home. The patient recalls feeling light-headed, which she attributed to having a low blood sugar.   Hospital Course:  Syncope and collapse with AKI and relative hypotension --Of note, patient reported having very limited PO intake prior to admission secondary to fasting for church --Continued on cautious IV fluid resuscitation --Heldl anti-hypertensives and diuretics given soft bp --Serial troponin neg --Complete echo done, notable findings of EF 40-45% (was 25-30% in 2014) --Please note, recommended to resume lasix only on discharge and to hold coreg and spironolactone given soft BP  DM with hypoglycemia --Held glipizide while admitted --Low dose SSI coverage AC/HS while admitted  ARF --Renal function improved with IVF hydration  Discharge Exam: Filed Vitals:   03/12/15 1936 03/13/15 0436 03/13/15 0940 03/13/15 0944   BP: 161/53 147/58 111/60 116/38  Pulse: 61 64 64   Temp: 98.6 F (37 C) 98.7 F (37.1 C)    TempSrc: Oral Oral    Resp: 18 18    Height:      Weight:  115.758 kg (255 lb 3.2 oz)    SpO2: 100% 100% 100% 100%    General: Awake, in nad Cardiovascular: regular, s1, s2 Respiratory: normal resp effort, no wheezing  Discharge Instructions     Medication List    STOP taking these medications        carvedilol 25 MG tablet  Commonly known as:  COREG     metroNIDAZOLE 500 MG tablet  Commonly known as:  FLAGYL     spironolactone 25 MG tablet  Commonly known as:  ALDACTONE      TAKE these medications        albuterol 108 (90 Base) MCG/ACT inhaler  Commonly known as:  PROVENTIL HFA;VENTOLIN HFA  Inhale 2 puffs into the lungs every 6 (six) hours as needed for wheezing or shortness of breath.     amitriptyline 10 MG tablet  Commonly known as:  ELAVIL  Take 10 mg by mouth at bedtime as needed for sleep.     aspirin 81 MG EC tablet  Take 1 tablet (81 mg total) by mouth daily.     Fenofibric Acid 135 MG Cpdr  Take 135 mg by mouth daily.     fluconazole 150 MG tablet  Commonly known as:  DIFLUCAN  Take 150 mg by mouth once a week. Reported on 03/11/2015     furosemide 40 MG tablet  Commonly known as:  LASIX  Take 1.5 tablets (60 mg total) by mouth 2 (two) times daily.     glipiZIDE 5 MG tablet  Commonly known as:  GLUCOTROL  Take 2.5 mg by mouth daily before breakfast.     HYDROcodone-acetaminophen 5-325 MG tablet  Commonly known as:  NORCO  Take 1 tablet by mouth every 6 (six) hours as needed for severe pain.     insulin lispro 100 UNIT/ML injection  Commonly known as:  HUMALOG  Inject 50-100 Units into the skin 3 (three) times daily with meals. Use 100units in am, 50 units for lunch and 50 units in pm     IRON 100/C PO  Take 1 tablet by mouth every other day.     isosorbide dinitrate 30 MG tablet  Commonly known as:  ISORDIL  Take 1 tablet (30 mg total) by  mouth daily.     latanoprost 0.005 % ophthalmic solution  Commonly known as:  XALATAN  Place 1 drop into both eyes at bedtime.     lisinopril 10 MG tablet  Commonly known as:  PRINIVIL,ZESTRIL  Take 20 mg by mouth daily.     meloxicam 7.5 MG tablet  Commonly known as:  MOBIC  Take 7.5 mg by mouth daily.     methocarbamol 500 MG tablet  Commonly known as:  ROBAXIN  Take 1 tablet (500 mg total) by mouth every 8 (eight) hours as needed for muscle spasms.     potassium phosphate (monobasic) 500 MG tablet  Commonly known as:  K-PHOS ORIGINAL  Take 500 mg by mouth daily as needed (takes when needed for spasms).       Allergies  Allergen Reactions  . Metformin And Related Itching, Swelling and Other (See Comments)    Leg pain & swelling in legs  . Atorvastatin Itching and Rash  . Levofloxacin Itching   Follow-up Information    Follow up with Millsaps, Luane School, NP. Schedule an appointment as soon as possible for a visit in 2 weeks.   Why:  Hospital follow up   Contact information:   [facility] [place] [address] [phone]       Schedule an appointment as soon as possible for a visit with Candee Furbish, MD.   Specialty:  Cardiology   Why:  Hospital follow up   Contact information:   [address] [place] [address] [phone]        The results of significant diagnostics from this hospitalization (including imaging, microbiology, ancillary and laboratory) are listed below for reference.    Significant Diagnostic Studies: Portable Chest 1 View  03/12/2015  CLINICAL DATA:  Syncope and collapse EXAM: PORTABLE CHEST 1 VIEW COMPARISON:  Jun 04, 2014 FINDINGS: Cardiomegaly remains. No pneumothorax. A left AICD device is again seen. No acute abnormalities otherwise noted. IMPRESSION: No active disease. Electronically Signed   By: Dorise Bullion III M.D   On: 03/12/2015 09:37    Microbiology: No results found  for this or any previous visit (from the past 240 hour(s)).   Labs: Basic Metabolic Panel:  Recent Labs Lab 03/11/15 2045 03/12/15 0908 03/13/15 0412  NA 138 137 138  K 5.0 4.9 4.9  CL 99* 100* 100*  CO2 28 24 28   GLUCOSE 101* 214* 261*  BUN 33* 38* 24*  CREATININE 1.84* 2.05* 1.44*  CALCIUM 10.7* 9.8 9.8   Liver Function Tests: No results for input(s): AST, ALT, ALKPHOS,  BILITOT, PROT, ALBUMIN in the last 168 hours. No results for input(s): LIPASE, AMYLASE in the last 168 hours. No results for input(s): AMMONIA in the last 168 hours. CBC:  Recent Labs Lab 03/11/15 2045 03/13/15 0412  WBC 7.4 5.7  HGB 14.7 12.8  HCT 43.1 39.1  MCV 94.9 94.2  PLT 217 173   Cardiac Enzymes:  Recent Labs Lab 03/12/15 0217 03/12/15 0908 03/12/15 1603  CKTOTAL 71  --   --   CKMB 1.0  --   --   TROPONINI <0.03 0.03 <0.03   BNP: BNP (last 3 results)  Recent Labs  05/25/14 2056 06/04/14 0427  BNP 362.5* 141.6*    ProBNP (last 3 results) No results for input(s): PROBNP in the last 8760 hours.  CBG:  Recent Labs Lab 03/12/15 0132 03/12/15 1336 03/12/15 1637 03/12/15 2045 03/13/15 0607  GLUCAP 108* 272* 309* 297* 243*    Signed:  CHIU, STEPHEN K  [facility] 03/13/2015, 12:23 PM

## 2015-03-13 NOTE — Progress Notes (Signed)
  Echocardiogram 2D Echocardiogram has been performed.  Jennette Dubin 03/13/2015, 9:57 AM

## 2015-03-14 LAB — HEMOGLOBIN A1C
HEMOGLOBIN A1C: 8.7 % — AB (ref 4.8–5.6)
MEAN PLASMA GLUCOSE: 203 mg/dL

## 2015-03-17 ENCOUNTER — Ambulatory Visit: Payer: Medicaid Other | Admitting: Physician Assistant

## 2015-03-20 NOTE — Progress Notes (Signed)
Cardiology Office Note:    Date:  03/21/2015   ID:  [name], DOB Dec 31, 1950, MRN [id number]  PCP:  Imelda Pillow, NP  Cardiologist:  Dr. Candee Furbish   Electrophysiologist:  Dr. Thompson Grayer   Chief Complaint  Patient presents with  . Hospitalization Follow-up    ECG - Admx with Syncope  . Congestive Heart Failure    Follow up    History of Present Illness:     [name] is a 65 y.o. female with a hx of CAD s/p CABG [id number], ICM, systolic HF, VTach, s/p ICD (CRT was unsuccessful due to vein slcerosis), OSA, DM2, HTN. She is followed in the [facility].  Last seen by Dr. Candee Furbish in 11/16  Admitted 3/3-3/5 with syncope.  She was found down by her children.  Notes indicate she recalled feeling light-headed. Creatinine was elevated and she reported poor PO intake.  She was given IVFs.  Echo demonstrated improved LVF with EF 40-45%.  BP was soft and medications were held.  Notes indicate Lasix was resumed at DC.  Coreg and Spironolactone were held at DC.  Creatinine 2.05 >> 1.44 at DC.  EDP notes from presentation indicate that ICD was interrogated and no abnormal activity was demonstrated.    Returns for FU.  She has been doing well since discharge from the hospital. She denies chest pain or syncope. She has chronic dyspnea with exertion. She is NYHA 3. She denies any changes. Sleeps on 2 pillows chronically. Denies PND or LE edema. Denies any ICD therapies. She denies coughing, wheezing, bleeding, fevers, chills.   Past Medical History  Diagnosis Date  . Coronary artery disease     s/p CABG 2007  . Sleep apnea     uses O2 at night  . Ischemic cardiomyopathy     EF 30-35%, s/p ICD 4/08 by Dr Leonia Reeves  . Diabetes mellitus     type II  . Hypertension   . Obesity   . CHF (congestive heart failure) ([place])   . Oxygen dependent 2 L  . Morbid obesity ([place]) 11/06/2012  . Angina decubitus ([place]) 11/06/2012  . 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    LIMA to D2, SVG to ramus  intermediate, sequential SVG to OM1-OM2, SVG to RCA with EVH via both legs   . Chronic combined systolic and diastolic CHF, NYHA class 3 ([place])   . Angina pectoris ([place])   . Ventricular tachycardia ([place]) 01/16/15    sustained VT terminated with ATP, CL 340 msec    Past Surgical History  Procedure Laterality Date  . Cardiac catheterization    . Cardiac defibrillator placement  4/08    by Dr Leonia Reeves ([facility])  . Coronary artery bypass graft  10/05/2005    by Dr Cyndia Bent  . Implantable cardioverter defibrillator implant  09/25/12    attempt of upgrade to CRT-D unsuccessful due to CS anatomy, SJM Unify [place] device placed with LV port capped by Dr Rayann Heman  . Bi-ventricular implantable cardioverter defibrillator upgrade N/A 09/25/2012    Procedure: BI-VENTRICULAR IMPLANTABLE CARDIOVERTER DEFIBRILLATOR UPGRADE;  Surgeon: Coralyn Mark, MD;  Location: [facility] CATH LAB;  Service: Cardiovascular;  Laterality: N/A;    Current Medications: Outpatient Prescriptions Prior to Visit  Medication Sig Dispense Refill  . albuterol (PROVENTIL HFA;VENTOLIN HFA) 108 (90 BASE) MCG/ACT inhaler Inhale 2 puffs into the lungs every 6 (six) hours as needed for wheezing or shortness of breath.    Marland Kitchen amitriptyline (ELAVIL) 10 MG tablet  Take 10 mg by mouth at bedtime as needed for sleep.     Marland Kitchen aspirin EC 81 MG EC tablet Take 1 tablet (81 mg total) by mouth daily. 30 tablet 12  . Choline Fenofibrate (FENOFIBRIC ACID) 135 MG CPDR Take 135 mg by mouth daily.    . furosemide (LASIX) 40 MG tablet Take 1.5 tablets (60 mg total) by mouth 2 (two) times daily. 90 tablet 1  . glipiZIDE (GLUCOTROL) 5 MG tablet Take 5 mg by mouth daily before breakfast.     . HYDROcodone-acetaminophen (NORCO) 5-325 MG per tablet Take 1 tablet by mouth every 6 (six) hours as needed for severe pain. 20 tablet 0  . insulin lispro (HUMALOG) 100 UNIT/ML injection Inject 50-100 Units into the skin 3 (three) times daily with meals. Use 100units in am, 50 units for  lunch and 50 units in pm    . Iron-Vitamin C (IRON 100/C PO) Take 1 tablet by mouth every other day.     . isosorbide dinitrate (ISORDIL) 30 MG tablet Take 1 tablet (30 mg total) by mouth daily. 30 tablet 1  . latanoprost (XALATAN) 0.005 % ophthalmic solution Place 1 drop into both eyes at bedtime.    Marland Kitchen lisinopril (PRINIVIL,ZESTRIL) 10 MG tablet Take 20 mg by mouth daily.    . meloxicam (MOBIC) 7.5 MG tablet Take 7.5 mg by mouth daily.    . methocarbamol (ROBAXIN) 500 MG tablet Take 1 tablet (500 mg total) by mouth every 8 (eight) hours as needed for muscle spasms. 45 tablet 0  . potassium phosphate, monobasic, (K-PHOS ORIGINAL) 500 MG tablet Take 500 mg by mouth daily as needed (takes when needed for spasms).     . fluconazole (DIFLUCAN) 150 MG tablet Take 150 mg by mouth once a week. Reported on 03/11/2015     No facility-administered medications prior to visit.     Allergies:   Metformin and related; Atorvastatin; and Levofloxacin   Social History   Social History  . Marital Status: Married    Spouse Name: N/A  . Number of Children: 3  . Years of Education: N/A   Occupational History  . HAIRDRESSER    Social History Main Topics  . Smoking status: Never Smoker   . Smokeless tobacco: None  . Alcohol Use: No  . Drug Use: No  . Sexual Activity: Not Currently   Other Topics Concern  . None   Social History Narrative   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     Family History:  The patient's family history includes Diabetes (age of onset: 24) in her mother.   ROS:   Please see the history of present illness.    Review of Systems  Cardiovascular: Positive for dyspnea on exertion and syncope.  Musculoskeletal: Positive for back pain.  All other systems reviewed and are negative.   Physical Exam:    VS:  BP 150/88 mmHg  Pulse 74  Ht 5\' 3"  (1.6 m)  Wt 257 lb 1.9 oz (116.629 kg)  BMI 45.56 kg/m2   GEN: Well nourished, well developed, in no  acute distress HEENT: normal Neck: no JVD, no masses Cardiac: Normal S1/S2, RRR; no murmurs, rubs, or gallops, trace bilateral LE edema  Respiratory:  clear to auscultation bilaterally; no wheezing, rhonchi or rales GI: soft, nontender, nondistended MS: no deformity or atrophy Skin: warm and dry Neuro: No focal deficits  Psych: Alert and oriented x 3, normal affect  Wt Readings from Last 3  Encounters:  03/21/15 257 lb 1.9 oz (116.629 kg)  03/13/15 255 lb 3.2 oz (115.758 kg)  01/17/15 253 lb 9.6 oz (115.032 kg)      Studies/Labs Reviewed:     EKG:  EKG is  ordered today.  The ekg ordered today demonstrates NSR, HR 78, IVCD, no change from prior tracing  Recent Labs: 05/26/2014: ALT 17 06/04/2014: B Natriuretic Peptide 141.6* 03/12/2015: TSH 1.786 03/13/2015: BUN 24*; Creatinine, Ser 1.44*; Hemoglobin 12.8; Platelets 173; Potassium 4.9; Sodium 138   Recent Lipid Panel    Component Value Date/Time   CHOL 90 12/22/2012 1042   CHOL 138 04/25/2012 0520   TRIG 85 12/22/2012 1042   TRIG 113 04/25/2012 0520   HDL 33* 12/22/2012 1042   HDL 33* 04/25/2012 0520   CHOLHDL 4.2 04/25/2012 0520   VLDL 23 04/25/2012 0520   LDLCALC 40 12/22/2012 1042   LDLCALC 82 04/25/2012 0520    Additional studies/ records that were reviewed today include:   Echo 03/13/15 Mild to mod LVH, EF 40-45%, no RWMA, Gr 1 DD  Myoview 5/16  Findings consistent with prior myocardial infarction.  This is a high risk study.  The left ventricular ejection fraction is severely decreased (<30%).  Defect 1: There is a large defect of severe severity present in the mid inferoseptal and apex location.  [place] 12/11 EF 30-35% L-D2 patent S-RI patent S-OM1/OM2 patent S-PDA patent  ASSESSMENT:     1. Syncope, unspecified syncope type   2. Chronic systolic CHF (congestive heart failure) ([place])   3. Ischemic cardiomyopathy   4. Coronary artery disease involving native coronary artery of native heart without  angina pectoris   5. Essential hypertension   6. ICD (implantable cardioverter-defibrillator) in place     PLAN:     In order of problems listed above:  1. Syncope - She was treated for dehydration in the hospital. Notes indicate ICD was interrogated without abnormal findings. Her echo showed improved LVF.  She had AKI upon admit which improved with IVFs.  It appears that dehydration was the most likely cause.  No further cardiac workup needed at this time.   2. Chronic systolic CHF - NYHA 3.  Volume appears stable.  She is off Fish farm manager.  BP is higher now.  Continue Lisinopril, Isordil, current dose of Lasix.  -  BMET, BNP today  -  Resume Coreg 12.5 mg bid  -  If SCr and K+ ok, consider adding Spiro 12.5 QD back to regimen.    3. Ischemic CM - EF improved to 40-45% by recent Echo.   4. CAD - s/p CABG.  LHC in 2/011 with patent grafts. Myoview in 2016 with inf-septal and apical scar.  Med Rx continued.  No angina.  Continue ASA.  Statin intol.   5. HTN - BP elevated.  Resume Coreg as noted.   6. S/p ICD - FU with EP as planned.    Medication Adjustments/Labs and Tests Ordered: Current medicines are reviewed at length with the patient today.  Concerns regarding medicines are outlined above.  Medication changes, Labs and Tests ordered today are outlined in the Patient Instructions noted below. Patient Instructions  Medication Instructions:  1. RESUME CARVEDILOL 12.5 MG TWICE DAILY (THIS WILL 1/2 TAB TWICE DAILY OF THE 25 MG TABLET)   Labwork: 1. TODAY BMET, BNP  Testing/Procedures: NONE  Follow-Up: 04/21/15 @ 11:30 WITH Samyukta Cura, PAC  Any Other Special Instructions Will Be Listed Below (If Applicable).  If you  need a refill on your cardiac medications before your next appointment, please call your pharmacy.    Signed, [name], PA-C  03/21/2015 2:52 PM    [facility] [address], [place], [place]  [address] Phone: [phone];  Fax: [phone]

## 2015-03-21 ENCOUNTER — Ambulatory Visit (INDEPENDENT_AMBULATORY_CARE_PROVIDER_SITE_OTHER): Payer: Medicare HMO | Admitting: Physician Assistant

## 2015-03-21 ENCOUNTER — Encounter: Payer: Self-pay | Admitting: Physician Assistant

## 2015-03-21 VITALS — BP 150/88 | HR 74 | Ht 63.0 in | Wt 257.1 lb

## 2015-03-21 DIAGNOSIS — I5022 Chronic systolic (congestive) heart failure: Secondary | ICD-10-CM

## 2015-03-21 DIAGNOSIS — I255 Ischemic cardiomyopathy: Secondary | ICD-10-CM | POA: Diagnosis not present

## 2015-03-21 DIAGNOSIS — I2510 Atherosclerotic heart disease of native coronary artery without angina pectoris: Secondary | ICD-10-CM

## 2015-03-21 DIAGNOSIS — R55 Syncope and collapse: Secondary | ICD-10-CM

## 2015-03-21 DIAGNOSIS — Z95810 Presence of automatic (implantable) cardiac defibrillator: Secondary | ICD-10-CM

## 2015-03-21 DIAGNOSIS — I10 Essential (primary) hypertension: Secondary | ICD-10-CM

## 2015-03-21 LAB — BASIC METABOLIC PANEL
BUN: 19 mg/dL (ref 7–25)
CALCIUM: 9.5 mg/dL (ref 8.6–10.4)
CHLORIDE: 102 mmol/L (ref 98–110)
CO2: 27 mmol/L (ref 20–31)
Creat: 0.89 mg/dL (ref 0.50–0.99)
GLUCOSE: 254 mg/dL — AB (ref 65–99)
POTASSIUM: 4.2 mmol/L (ref 3.5–5.3)
SODIUM: 138 mmol/L (ref 135–146)

## 2015-03-21 NOTE — Patient Instructions (Addendum)
Medication Instructions:  1. RESUME CARVEDILOL 12.5 MG TWICE DAILY (THIS WILL 1/2 TAB TWICE DAILY OF THE 25 MG TABLET)   Labwork: 1. TODAY BMET, BNP  Testing/Procedures: NONE  Follow-Up: 04/21/15 @ 11:30 WITH SCOTT WEAVER, PAC  Any Other Special Instructions Will Be Listed Below (If Applicable).  If you need a refill on your cardiac medications before your next appointment, please call your pharmacy.

## 2015-03-22 LAB — BRAIN NATRIURETIC PEPTIDE: Brain Natriuretic Peptide: 73.5 pg/mL (ref <100)

## 2015-03-24 ENCOUNTER — Telehealth: Payer: Self-pay | Admitting: *Deleted

## 2015-03-24 DIAGNOSIS — I519 Heart disease, unspecified: Secondary | ICD-10-CM

## 2015-03-24 DIAGNOSIS — I5042 Chronic combined systolic (congestive) and diastolic (congestive) heart failure: Secondary | ICD-10-CM

## 2015-03-24 DIAGNOSIS — N179 Acute kidney failure, unspecified: Secondary | ICD-10-CM

## 2015-03-24 MED ORDER — SPIRONOLACTONE 25 MG PO TABS: 12.5000 mg | ORAL_TABLET | Freq: Every day | ORAL | Status: DC

## 2015-03-24 NOTE — Telephone Encounter (Signed)
Pt notified to resume Spironolactone 12.5 mg daily; new Rx sent in. BMET 3/27, 4/3. Pt said takes a few days for Pharm to mail Rx. I advised pt to start on 03/28/15. Pt agreeable to plan of care.

## 2015-03-28 ENCOUNTER — Telehealth: Payer: Self-pay

## 2015-03-28 ENCOUNTER — Ambulatory Visit (INDEPENDENT_AMBULATORY_CARE_PROVIDER_SITE_OTHER): Payer: Medicare HMO

## 2015-03-28 ENCOUNTER — Telehealth: Payer: Self-pay | Admitting: Internal Medicine

## 2015-03-28 DIAGNOSIS — I5042 Chronic combined systolic (congestive) and diastolic (congestive) heart failure: Secondary | ICD-10-CM | POA: Diagnosis not present

## 2015-03-28 DIAGNOSIS — Z95810 Presence of automatic (implantable) cardiac defibrillator: Secondary | ICD-10-CM | POA: Diagnosis not present

## 2015-03-28 NOTE — Progress Notes (Signed)
EPIC Encounter for ICM Monitoring  Patient Name: [name] is a 65 y.o. female Date: 03/28/2015 Primary Care Physican: Imelda Pillow, NP Primary Cardiologist: Marlou Porch Electrophysiologist: Allred Dry Weight: unknown   In the past month, have you:  1. Gained more than 2 pounds in a day or more than 5 pounds in a week? N/A  2. Had changes in your medications (with verification of current medications)? N/A  3. Had more shortness of breath than is usual for you? N/A  4. Limited your activity because of shortness of breath? N/A  5. Not been able to sleep because of shortness of breath? N/A  6. Had increased swelling in your feet or ankles? N/A  7. Had symptoms of dehydration (dizziness, dry mouth, increased thirst, decreased urine output) N/A  8. Had changes in sodium restriction? N/A  9. Been compliant with medication? N/A   ICM trend: 3 month view for 03/28/2015   ICM trend: 1 year view for 03/28/2015   Direct Trend Viewer 03/28/2015   Follow-up plan: ICM clinic phone appointment on 04/20/2015.  Attempted call to patient x 3 and unable to reach.  Transmission reviewed. Thoracic impedance below reference line from 03/18/2015 to 03/26/2015 suggesting fluid accumulation. Thoracic impedance returned to reference line 03/26/2015.   Repeat transmission on 04/20/2015 - day before office visit with [name], [place], RN, CCM 03/28/2015 3:05 PM

## 2015-03-28 NOTE — Telephone Encounter (Signed)
Spoke with patient and reminded her to send ICM remote transmission today.

## 2015-03-28 NOTE — Telephone Encounter (Signed)
New Mess   Pt returning rn phone call. Please call back and discuss.

## 2015-03-29 ENCOUNTER — Telehealth: Payer: Self-pay | Admitting: Internal Medicine

## 2015-03-29 ENCOUNTER — Ambulatory Visit
Admission: RE | Admit: 2015-03-29 | Discharge: 2015-03-29 | Disposition: A | Discharge status: Home / Self Care | Payer: Medicare HMO | Source: Ambulatory Visit

## 2015-03-29 DIAGNOSIS — Z1231 Encounter for screening mammogram for malignant neoplasm of breast: Secondary | ICD-10-CM

## 2015-03-29 NOTE — Telephone Encounter (Signed)
Attempted call back and left message for return call.  Call back number provided.

## 2015-03-29 NOTE — Telephone Encounter (Signed)
Attempted ICM call to patient and unable to reach.

## 2015-03-29 NOTE — Telephone Encounter (Signed)
Follow Up     4. Are you calling to see if we received your device transmission? YES   Pt is returning the call

## 2015-03-29 NOTE — Telephone Encounter (Signed)
F/u   Pt returning [place] phone call. Please call back and discuss.

## 2015-03-29 NOTE — Telephone Encounter (Signed)
Laurie aware.

## 2015-03-30 ENCOUNTER — Other Ambulatory Visit: Payer: Self-pay | Admitting: *Deleted

## 2015-03-30 DIAGNOSIS — R928 Other abnormal and inconclusive findings on diagnostic imaging of breast: Secondary | ICD-10-CM

## 2015-04-04 ENCOUNTER — Other Ambulatory Visit (INDEPENDENT_AMBULATORY_CARE_PROVIDER_SITE_OTHER): Payer: Medicare HMO | Admitting: *Deleted

## 2015-04-04 DIAGNOSIS — N179 Acute kidney failure, unspecified: Secondary | ICD-10-CM

## 2015-04-04 DIAGNOSIS — I5042 Chronic combined systolic (congestive) and diastolic (congestive) heart failure: Secondary | ICD-10-CM

## 2015-04-04 DIAGNOSIS — I519 Heart disease, unspecified: Secondary | ICD-10-CM

## 2015-04-04 LAB — BASIC METABOLIC PANEL
BUN: 15 mg/dL (ref 7–25)
CALCIUM: 10.1 mg/dL (ref 8.6–10.4)
CO2: 29 mmol/L (ref 20–31)
CREATININE: 1.13 mg/dL — AB (ref 0.50–0.99)
Chloride: 101 mmol/L (ref 98–110)
GLUCOSE: 219 mg/dL — AB (ref 65–99)
Potassium: 4.3 mmol/L (ref 3.5–5.3)
SODIUM: 138 mmol/L (ref 135–146)

## 2015-04-05 ENCOUNTER — Telehealth: Payer: Self-pay | Admitting: *Deleted

## 2015-04-05 MED ORDER — FUROSEMIDE 40 MG PO TABS: 60.0000 mg | ORAL_TABLET | ORAL | Status: DC

## 2015-04-05 NOTE — Telephone Encounter (Signed)
Pt notified of lab results and to decrease lasix to 60 mg in the AM and 40 mg PM, BMET 4/3. Pt agreeable to plan of care and all instructions.

## 2015-04-06 ENCOUNTER — Ambulatory Visit
Admission: RE | Admit: 2015-04-06 | Discharge: 2015-04-06 | Disposition: A | Discharge status: Home / Self Care | Payer: Medicare HMO | Source: Ambulatory Visit | Attending: *Deleted | Admitting: *Deleted

## 2015-04-06 DIAGNOSIS — R928 Other abnormal and inconclusive findings on diagnostic imaging of breast: Secondary | ICD-10-CM

## 2015-04-11 ENCOUNTER — Other Ambulatory Visit (INDEPENDENT_AMBULATORY_CARE_PROVIDER_SITE_OTHER): Payer: Medicare HMO | Admitting: *Deleted

## 2015-04-11 DIAGNOSIS — I519 Heart disease, unspecified: Secondary | ICD-10-CM

## 2015-04-11 DIAGNOSIS — N179 Acute kidney failure, unspecified: Secondary | ICD-10-CM

## 2015-04-11 DIAGNOSIS — I5042 Chronic combined systolic (congestive) and diastolic (congestive) heart failure: Secondary | ICD-10-CM

## 2015-04-11 LAB — BASIC METABOLIC PANEL
BUN: 20 mg/dL (ref 7–25)
CALCIUM: 9 mg/dL (ref 8.6–10.4)
CO2: 23 mmol/L (ref 20–31)
CREATININE: 1.12 mg/dL — AB (ref 0.50–0.99)
Chloride: 103 mmol/L (ref 98–110)
Glucose, Bld: 212 mg/dL — ABNORMAL HIGH (ref 65–99)
Potassium: 4.3 mmol/L (ref 3.5–5.3)
Sodium: 136 mmol/L (ref 135–146)

## 2015-04-12 ENCOUNTER — Other Ambulatory Visit: Payer: Self-pay | Admitting: *Deleted

## 2015-04-12 ENCOUNTER — Telehealth: Payer: Self-pay | Admitting: *Deleted

## 2015-04-12 DIAGNOSIS — R921 Mammographic calcification found on diagnostic imaging of breast: Secondary | ICD-10-CM

## 2015-04-12 NOTE — Telephone Encounter (Signed)
No answer. Will try again later to go over lab results.

## 2015-04-12 NOTE — Telephone Encounter (Signed)
Follow up    Patient returning call back to nurse - lab results

## 2015-04-12 NOTE — Telephone Encounter (Signed)
Pt notified of lab results by phone with verbal understanding.  

## 2015-04-20 ENCOUNTER — Telehealth: Payer: Self-pay | Admitting: Cardiology

## 2015-04-20 ENCOUNTER — Ambulatory Visit (INDEPENDENT_AMBULATORY_CARE_PROVIDER_SITE_OTHER): Payer: Medicare HMO

## 2015-04-20 DIAGNOSIS — Z95810 Presence of automatic (implantable) cardiac defibrillator: Secondary | ICD-10-CM

## 2015-04-20 DIAGNOSIS — I5042 Chronic combined systolic (congestive) and diastolic (congestive) heart failure: Secondary | ICD-10-CM

## 2015-04-20 NOTE — Telephone Encounter (Signed)
Spoke with pt and reminded pt of remote transmission that is due today. Pt verbalized understanding.   

## 2015-04-20 NOTE — Progress Notes (Signed)
EPIC Encounter for ICM Monitoring  Patient Name: [name] is a 65 y.o. female Date: 04/20/2015 Primary Care Physican: Imelda Pillow, NP Primary Cardiologist: Marlou Porch  Electrophysiologist: Allred Dry Weight: No scales    In the past month, have you:  1. Gained more than 2 pounds in a day or more than 5 pounds in a week? Unknown  2. Had changes in your medications (with verification of current medications)? Decrease in Furosemide 4/3  3. Had more shortness of breath than is usual for you? no  4. Limited your activity because of shortness of breath? no  5. Not been able to sleep because of shortness of breath? no  6. Had increased swelling in your feet or ankles? no  7. Had symptoms of dehydration (dizziness, dry mouth, increased thirst, decreased urine output) no  8. Had changes in sodium restriction? no  9. Been compliant with medication? Yes   ICM trend: 3 month view for 04/20/2015   ICM trend: 1 year view for 04/20/2015   Follow-up plan: ICM clinic phone appointment on 05/23/2015.  Thoracic impedance above reference line from 04/14/2015 to 04/20/2015 suggesting dryness.  Patient denied fluid or dehydration symptoms.  Lasix was decreased to 60 mg in am and 40 mg pm on 04/11/2015 after lab results were reviewed.   Encouraged to call for any fluid symptoms.  No changes today.    Patient has appointment with [name], PA on 04/21/2015 at 11:30AM.    Copy of note sent to patient's primary care physician, primary cardiologist, PA and device following physician.  [name], RN, CCM 04/20/2015 2:08 PM

## 2015-04-20 NOTE — Progress Notes (Signed)
Cardiology Office Note:    Date:  04/21/2015   ID:  [name], DOB 26-Oct-1950, MRN [id number]  PCP:  Jamie Pillow, NP  Cardiologist:  Dr. Candee Tran   Electrophysiologist:  Dr. Thompson Tran   Chief Complaint  Patient presents with  . Congestive Heart Failure    follow up    History of Present Illness:     [name] is a 65 y.o. female with a hx of CAD s/p CABG [id number], ICM, systolic HF, VTach, s/p ICD (CRT was unsuccessful due to vein slcerosis), OSA, DM2, HTN. She is followed in the [facility].  Last seen by Dr. Candee Tran in 11/16  Admitted 3/17 with syncope.  She was found down by her children.  Notes indicate she recalled feeling light-headed. Creatinine was elevated and she reported poor PO intake.  She was given IVFs.  Echo demonstrated improved LVF with EF 40-45%.  BP was soft and medications were held.  Notes indicate Lasix was resumed at DC.  Coreg and Spironolactone were held at DC.  Creatinine 2.05 >> 1.44 at DC.  EDP notes from presentation indicate that ICD was interrogated and no abnormal activity was demonstrated.  Overall, it was felt her picture was c/w dehydration.    I saw her 03/21/15.  I placed her back on Coreg and Spiro.   FU Creatinine was high and I reduced her lasix.  Recent ICM device monitoring demonstrates her thoracic impedance is increased suggesting dryness.  Returns for FU.  Overall, stable.  Breathing is stable.  Denies chest pain, syncope, PND.  Sleeps on 2 pillows chronically.  No bleeding issues.   Past Medical History  Diagnosis Date  . Coronary artery disease     s/p CABG 2007  . Sleep apnea     uses O2 at night  . Ischemic cardiomyopathy     EF 30-35%, s/p ICD 4/08 by Dr [name]  . Diabetes mellitus     type II  . Hypertension   . Obesity   . CHF (congestive heart failure) ([place])   . Oxygen dependent 2 L  . Morbid obesity ([place]) 11/06/2012  . Angina decubitus ([place]) 11/06/2012  . 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    LIMA to D2, SVG to  ramus intermediate, sequential SVG to OM1-OM2, SVG to RCA with EVH via both legs   . Chronic combined systolic and diastolic CHF, NYHA class 3 ([place])   . Angina pectoris ([place])   . Ventricular tachycardia ([place]) 01/16/15    sustained VT terminated with ATP, CL 340 msec    Past Surgical History  Procedure Laterality Date  . Cardiac catheterization    . Cardiac defibrillator placement  4/08    by Dr [name] (MDT)  . Coronary artery bypass graft  10/05/2005    by Dr [name]  . Implantable cardioverter defibrillator implant  09/25/12    attempt of upgrade to CRT-D unsuccessful due to CS anatomy, SJM Unify [place] device placed with LV port capped by Dr [name]  . Bi-ventricular implantable cardioverter defibrillator upgrade N/A 09/25/2012    Procedure: BI-VENTRICULAR IMPLANTABLE CARDIOVERTER DEFIBRILLATOR UPGRADE;  Surgeon: Jamie Mark, MD;  Location: [facility] CATH LAB;  Service: Cardiovascular;  Laterality: N/A;    Current Medications: Outpatient Prescriptions Prior to Visit  Medication Sig Dispense Refill  . albuterol (PROVENTIL HFA;VENTOLIN HFA) 108 (90 BASE) MCG/ACT inhaler Inhale 2 puffs into the lungs every 6 (six) hours as needed for wheezing or shortness of breath.    Marland Kitchen  aspirin EC 81 MG EC tablet Take 1 tablet (81 mg total) by mouth daily. 30 tablet 12  . Choline Fenofibrate (FENOFIBRIC ACID) 135 MG CPDR Take 135 mg by mouth daily.    Marland Kitchen glipiZIDE (GLUCOTROL) 5 MG tablet Take 5 mg by mouth daily before breakfast.     . HYDROcodone-acetaminophen (NORCO) 5-325 MG per tablet Take 1 tablet by mouth every 6 (six) hours as needed for severe pain. 20 tablet 0  . insulin lispro (HUMALOG) 100 UNIT/ML injection Inject 50-100 Units into the skin 3 (three) times daily with meals. Use 100units in am, 50 units for lunch and 50 units in pm    . Iron-Vitamin C (IRON 100/C PO) Take 1 tablet by mouth every other day.     . isosorbide dinitrate (ISORDIL) 30 MG tablet Take 1 tablet (30 mg total) by mouth daily. 30  tablet 1  . latanoprost (XALATAN) 0.005 % ophthalmic solution Place 1 drop into both eyes at bedtime.    Marland Kitchen lisinopril (PRINIVIL,ZESTRIL) 10 MG tablet Take 10 mg by mouth daily.     . meloxicam (MOBIC) 7.5 MG tablet Take 7.5 mg by mouth daily.    . methocarbamol (ROBAXIN) 500 MG tablet Take 1 tablet (500 mg total) by mouth every 8 (eight) hours as needed for muscle spasms. 45 tablet 0  . potassium phosphate, monobasic, (K-PHOS ORIGINAL) 500 MG tablet Take 500 mg by mouth daily as needed (takes when needed for spasms).     Marland Kitchen spironolactone (ALDACTONE) 25 MG tablet Take 0.5 tablets (12.5 mg total) by mouth daily. 30 tablet 11  . amitriptyline (ELAVIL) 10 MG tablet Take 10 mg by mouth at bedtime as needed for sleep. Reported on 04/21/2015    . furosemide (LASIX) 40 MG tablet Take 1.5 tablets (60 mg total) by mouth as directed. 2 tabs AM and 1 tab PM     No facility-administered medications prior to visit.     Allergies:   Metformin and related; Atorvastatin; and Levofloxacin   Social History   Social History  . Marital Status: Married    Spouse Name: N/A  . Number of Children: 3  . Years of Education: N/A   Occupational History  . HAIRDRESSER    Social History Main Topics  . Smoking status: Never Smoker   . Smokeless tobacco: None  . Alcohol Use: No  . Drug Use: No  . Sexual Activity: Not Currently   Other Topics Concern  . None   Social History Narrative   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     Family History:  The patient's family history includes Diabetes (age of onset: 76) in her mother.   ROS:   Please see the history of present illness.    ROSAll other systems reviewed and are negative.   Physical Exam:    VS:  BP 132/80 mmHg  Pulse 60  Ht 5\' 3"  (1.6 m)  Wt 253 lb 12.8 oz (115.123 kg)  BMI 44.97 kg/m2   GEN: Well nourished, well developed, in no acute distress HEENT: normal Neck: no JVD, no masses Cardiac: Normal S1/S2, RRR; no  murmurs, rubs, or gallops, no LE edema  Respiratory:  clear to auscultation bilaterally; no wheezing, rhonchi or rales GI: soft, nontender, nondistended MS: no deformity or atrophy Skin: warm and dry Neuro: No focal deficits  Psych: Alert and oriented x 3, normal affect  Wt Readings from Last 3 Encounters:  04/21/15 253 lb 12.8 oz (115.123 kg)  03/21/15 257 lb 1.9 oz (116.629 kg)  03/13/15 255 lb 3.2 oz (115.758 kg)      Studies/Labs Reviewed:     EKG:  EKG is  ordered today.  The ekg ordered today demonstrates NSR, HR 60, LBBB, no change  Recent Labs: 05/26/2014: ALT 17 06/04/2014: B Natriuretic Peptide 141.6* 03/12/2015: TSH 1.786 03/13/2015: Hemoglobin 12.8; Platelets 173 04/11/2015: BUN 20; Creat 1.12*; Potassium 4.3; Sodium 136   Recent Lipid Panel    Component Value Date/Time   CHOL 90 12/22/2012 1042   CHOL 138 04/25/2012 0520   TRIG 85 12/22/2012 1042   TRIG 113 04/25/2012 0520   HDL 33* 12/22/2012 1042   HDL 33* 04/25/2012 0520   CHOLHDL 4.2 04/25/2012 0520   VLDL 23 04/25/2012 0520   LDLCALC 40 12/22/2012 1042   LDLCALC 82 04/25/2012 0520    Additional studies/ records that were reviewed today include:   Echo 03/13/15 Mild to mod LVH, EF 40-45%, no RWMA, Gr 1 DD  Myoview 5/16  Findings consistent with prior myocardial infarction.  This is a high risk study.  The left ventricular ejection fraction is severely decreased (<30%).  Defect 1: There is a large defect of severe severity present in the mid inferoseptal and apex location.  [place] 12/11 EF 30-35% L-D2 patent S-RI patent S-OM1/OM2 patent S-PDA patent  ASSESSMENT:     1. Chronic systolic CHF (congestive heart failure) ([place])   2. Ischemic cardiomyopathy   3. Coronary artery disease involving native coronary artery of native heart without angina pectoris   4. Essential hypertension   5. ICD (implantable cardioverter-defibrillator) in place     PLAN:     In order of problems listed above:  1.  Chronic systolic CHF - NYHA 3.  Volume appears stable.  She reduced Lasix to 40 bid on her own.  Volume status has remained stable.   -  Continue Coreg, Lisinopril, Spiro, nitrates.  -  EF > 40% so no indication for Entresto.  -  Thoracic impedance suggests dryness.  Will repeat BMET again in 1 week.  If BUN/SCr higher, will reduce Lasix further.   2. Ischemic CM - EF improved to 40-45% by recent Echo.   3. CAD - s/p CABG.  LHC in 2/011 with patent grafts. Myoview in 2016 with inf-septal and apical scar.  Med Rx continued.  No angina.  Continue ASA.  Statin intol.   4. HTN - BP controlled.   5. S/p ICD - FU with EP as planned.    Medication Adjustments/Labs and Tests Ordered: Current medicines are reviewed at length with the patient today.  Concerns regarding medicines are outlined above.  Medication changes, Labs and Tests ordered today are outlined in the Patient Instructions noted below. Patient Instructions  Medication Instructions:  Your physician recommends that you continue on your current medications as directed. Please refer to the Current Medication list given to you today. Labwork: 1 WEEK FOR BMET Testing/Procedures: NONE Follow-Up: DR. Marlou Porch 4 MONTHS; WE WILL SEND A REMINDER LETTER A COUPLE OF MONTHS EARLY TO CALL AND MAKE AN APPT Any Other Special Instructions Will Be Listed Below (If Applicable). If you need a refill on your cardiac medications before your next appointment, please call your pharmacy.   Signed, [name], PA-C  04/21/2015 1:28 PM    [facility] [address], [place], [place]  [address] Phone: [phone]; Fax: [phone]

## 2015-04-21 ENCOUNTER — Encounter: Payer: Self-pay | Admitting: Physician Assistant

## 2015-04-21 ENCOUNTER — Ambulatory Visit (INDEPENDENT_AMBULATORY_CARE_PROVIDER_SITE_OTHER): Payer: Medicare HMO | Admitting: Physician Assistant

## 2015-04-21 VITALS — BP 132/80 | HR 60 | Ht 63.0 in | Wt 253.8 lb

## 2015-04-21 DIAGNOSIS — I255 Ischemic cardiomyopathy: Secondary | ICD-10-CM | POA: Diagnosis not present

## 2015-04-21 DIAGNOSIS — I10 Essential (primary) hypertension: Secondary | ICD-10-CM

## 2015-04-21 DIAGNOSIS — I5022 Chronic systolic (congestive) heart failure: Secondary | ICD-10-CM

## 2015-04-21 DIAGNOSIS — Z95810 Presence of automatic (implantable) cardiac defibrillator: Secondary | ICD-10-CM

## 2015-04-21 DIAGNOSIS — I2510 Atherosclerotic heart disease of native coronary artery without angina pectoris: Secondary | ICD-10-CM | POA: Diagnosis not present

## 2015-04-21 NOTE — Patient Instructions (Addendum)
Medication Instructions:  Your physician recommends that you continue on your current medications as directed. Please refer to the Current Medication list given to you today. Labwork: 1 WEEK FOR BMET Testing/Procedures: NONE Follow-Up: DR. Marlou Porch 4 MONTHS; WE WILL SEND A REMINDER LETTER A COUPLE OF MONTHS EARLY TO CALL AND MAKE AN APPT Any Other Special Instructions Will Be Listed Below (If Applicable). If you need a refill on your cardiac medications before your next appointment, please call your pharmacy.

## 2015-04-21 NOTE — Progress Notes (Signed)
Thank you [name], PA-C   04/21/2015 5:03 PM

## 2015-04-29 ENCOUNTER — Other Ambulatory Visit (INDEPENDENT_AMBULATORY_CARE_PROVIDER_SITE_OTHER): Payer: Medicare HMO | Admitting: *Deleted

## 2015-04-29 ENCOUNTER — Telehealth: Payer: Self-pay | Admitting: *Deleted

## 2015-04-29 DIAGNOSIS — I5022 Chronic systolic (congestive) heart failure: Secondary | ICD-10-CM | POA: Diagnosis not present

## 2015-04-29 DIAGNOSIS — I5042 Chronic combined systolic (congestive) and diastolic (congestive) heart failure: Secondary | ICD-10-CM

## 2015-04-29 LAB — BASIC METABOLIC PANEL
BUN: 21 mg/dL (ref 7–25)
CO2: 26 mmol/L (ref 20–31)
Calcium: 9.4 mg/dL (ref 8.6–10.4)
Chloride: 94 mmol/L — ABNORMAL LOW (ref 98–110)
Creat: 1.11 mg/dL — ABNORMAL HIGH (ref 0.50–0.99)
Glucose, Bld: 231 mg/dL — ABNORMAL HIGH (ref 65–99)
POTASSIUM: 4.4 mmol/L (ref 3.5–5.3)
SODIUM: 128 mmol/L — AB (ref 135–146)

## 2015-04-29 NOTE — Telephone Encounter (Signed)
Pt notified of lab results per DOD Dr. Meda Coffee who advised to decrease lasix from 40 BID to 40 mg daily with BMET 5/5. Pt agreeble to plan of care.

## 2015-05-02 ENCOUNTER — Telehealth: Payer: Self-pay

## 2015-05-02 ENCOUNTER — Ambulatory Visit (INDEPENDENT_AMBULATORY_CARE_PROVIDER_SITE_OTHER): Payer: Medicare HMO

## 2015-05-02 ENCOUNTER — Telehealth: Payer: Self-pay | Admitting: *Deleted

## 2015-05-02 DIAGNOSIS — I5022 Chronic systolic (congestive) heart failure: Secondary | ICD-10-CM

## 2015-05-02 DIAGNOSIS — Z95810 Presence of automatic (implantable) cardiac defibrillator: Secondary | ICD-10-CM

## 2015-05-02 NOTE — Telephone Encounter (Signed)
No answer

## 2015-05-02 NOTE — Telephone Encounter (Signed)
Pt notified of lab results. I asked pt how her breathing was, pt states ok when sitting though sob when walking. I asked how her weight was. Pt states up and down, today was 259. Henderson Cloud, RN from our device area will call to set up remote check.

## 2015-05-02 NOTE — Progress Notes (Signed)
As noted. Have her take Lasix 80 mg in A and 40 mg in P x 3 days. Then decrease Lasix to 40 mg bid L. Short, RN will repeat ICM device check later this week. Repeat BMET later this week as noted. Please make sure patient gets FU with me in 1 week. [name], PA-C   05/02/2015 5:37 PM

## 2015-05-02 NOTE — Telephone Encounter (Signed)
Call to patient and advised [name], PA wanted to check a remote transmission to see if she has fluid since the Sodium level is lower than normal.  She stated she will send a transmission now.

## 2015-05-02 NOTE — Progress Notes (Signed)
EPIC Encounter for ICM Monitoring  Patient Name: [name] is a 65 y.o. female Date: 05/02/2015 Primary Care Physican: Imelda Pillow, NP Primary Cardiologist: Marlou Porch Electrophysiologist: Allred Dry Weight: No scales    In the past month, have you:  1. Gained more than 2 pounds in a day or more than 5 pounds in a week? unknown  2. Had changes in your medications (with verification of current medications)? Yes, Furosemide was decreased on 04/05/2015 to 60 mg am and 40 mg pm.   At office visit with [name] on 04/21/2015 patient had decreased Furosemide on her own to 40 mg bid.   04/29/2015 lab results were discussed with DOD and Furosemide was decreased to 40 mg daily.     3. Had more shortness of breath than is usual for you? no  4. Limited your activity because of shortness of breath? no  5. Not been able to sleep because of shortness of breath? no  6. Had increased swelling in your feet or ankles? no  7. Had symptoms of dehydration (dizziness, dry mouth, increased thirst, decreased urine output) no  8. Had changes in sodium restriction? no  9. Been compliant with medication? Yes  ICM trend: 3 month view for 05/02/2015  ICM trend: 1 year view for 05/02/2015  Follow-up plan: ICM clinic phone appointment 05/05/2015.  Received request from [name], [place] to recheck fluid levels.  Furosemide was decreased on 04/29/2015.    FLUID LEVELS: Since last ICM transmission on 04/20/2015, Corvue thoracic impedance decreased 04/26/2015 to 05/02/2015 suggesting fluid accumulation.     SYMPTOMS: None but has SOB with activities but is no worse than usual.    EDUCATION: Limit sodium intake to < 2000 mg and fluid intake to 64 oz daily.           Reviewed today's transmission with [name], PA in office and advised thoracic impedance suggests fluid accumulation.  He ordered Furosemide 80 mg in am and 40 mg pm x 3 days, BMET on 05/05/2015 and then        Resume Furosemide 40 mg daily  until lab results are obtained.  Will recheck fluid levels on 05/05/2015.  Make an appointment with [name] in a week.    Call to patient and advised of [name], PA, orders and she verbalized understanding.   Advised will send copy of note to Dr. Marlou Porch and Dr. Rayann Heman,   [name], RN, CCM 05/02/2015 4:05 PM

## 2015-05-05 ENCOUNTER — Other Ambulatory Visit: Payer: Medicare HMO

## 2015-05-05 ENCOUNTER — Ambulatory Visit (INDEPENDENT_AMBULATORY_CARE_PROVIDER_SITE_OTHER): Payer: Medicare HMO

## 2015-05-05 ENCOUNTER — Telehealth: Payer: Self-pay

## 2015-05-05 DIAGNOSIS — Z95810 Presence of automatic (implantable) cardiac defibrillator: Secondary | ICD-10-CM | POA: Diagnosis not present

## 2015-05-05 DIAGNOSIS — I5022 Chronic systolic (congestive) heart failure: Secondary | ICD-10-CM

## 2015-05-05 NOTE — Telephone Encounter (Signed)
Remote ICM transmission received.  Attempted patient call and left message for return call.   

## 2015-05-05 NOTE — Progress Notes (Signed)
EPIC Encounter for ICM Monitoring  Patient Name: [name] is a 65 y.o. female Date: 05/05/2015 Primary Care Physican: Imelda Pillow, NP Primary Cardiologist: Marlou Porch Electrophysiologist: Allred Dry Weight: No scales       In the past month, have you:  1. Gained more than 2 pounds in a day or more than 5 pounds in a week? no  2. Had changes in your medications (with verification of current medications)? no  3. Had more shortness of breath than is usual for you? no  4. Limited your activity because of shortness of breath? no  5. Not been able to sleep because of shortness of breath? no  6. Had increased swelling in your feet or ankles? no  7. Had symptoms of dehydration (dizziness, dry mouth, increased thirst, decreased urine output) no  8. Had changes in sodium restriction? no  9. Been compliant with medication? Yes  ICM trend: 3 month view for 05/05/2015  ICM trend: 1 year view for 05/05/2015  Follow-up plan: ICM clinic phone appointment 05/18/2015, day before office visit with [name], PA on 05/19/2015.    FLUID LEVELS: Since last ICM transmission on 05/02/2015, Corvue thoracic impedance returned to baseline 05/05/2015 which correlates with increased in Furosemide dosage.  Patient is due to have BMET drawn 05/06/2015.  She reported she will be at the lab at 8:30am.      SYMPTOMS: None. Encouraged to call for any fluid symptoms, dizziness, lightheadedness or any new symptoms.    Reviewed today's transmission in the office with [name], PA.Marland Kitchen He ordered to take Furosemide 40 bid.  Patient having BMET 05/06/2015. Advised patient to take Furosemide 40 bid as ordered by [name], PA.    [name], RN, CCM 05/05/2015 11:39 AM

## 2015-05-06 ENCOUNTER — Emergency Department (HOSPITAL_COMMUNITY): Payer: Medicare HMO

## 2015-05-06 ENCOUNTER — Other Ambulatory Visit (INDEPENDENT_AMBULATORY_CARE_PROVIDER_SITE_OTHER): Payer: Medicare HMO | Admitting: *Deleted

## 2015-05-06 ENCOUNTER — Encounter (HOSPITAL_COMMUNITY): Payer: Self-pay | Admitting: *Deleted

## 2015-05-06 ENCOUNTER — Emergency Department (HOSPITAL_COMMUNITY)
Admission: EM | Admit: 2015-05-06 | Discharge: 2015-05-06 | Disposition: A | Discharge status: Home / Self Care | Payer: Medicare HMO | Attending: Emergency Medicine | Admitting: Emergency Medicine

## 2015-05-06 DIAGNOSIS — G4730 Sleep apnea, unspecified: Secondary | ICD-10-CM | POA: Diagnosis not present

## 2015-05-06 DIAGNOSIS — Z7984 Long term (current) use of oral hypoglycemic drugs: Secondary | ICD-10-CM | POA: Diagnosis not present

## 2015-05-06 DIAGNOSIS — Z95810 Presence of automatic (implantable) cardiac defibrillator: Secondary | ICD-10-CM | POA: Diagnosis not present

## 2015-05-06 DIAGNOSIS — Z9981 Dependence on supplemental oxygen: Secondary | ICD-10-CM | POA: Diagnosis not present

## 2015-05-06 DIAGNOSIS — I5042 Chronic combined systolic (congestive) and diastolic (congestive) heart failure: Secondary | ICD-10-CM | POA: Insufficient documentation

## 2015-05-06 DIAGNOSIS — I5022 Chronic systolic (congestive) heart failure: Secondary | ICD-10-CM | POA: Diagnosis not present

## 2015-05-06 DIAGNOSIS — J159 Unspecified bacterial pneumonia: Secondary | ICD-10-CM | POA: Diagnosis not present

## 2015-05-06 DIAGNOSIS — Z9889 Other specified postprocedural states: Secondary | ICD-10-CM | POA: Diagnosis not present

## 2015-05-06 DIAGNOSIS — I25119 Atherosclerotic heart disease of native coronary artery with unspecified angina pectoris: Secondary | ICD-10-CM | POA: Diagnosis not present

## 2015-05-06 DIAGNOSIS — Z794 Long term (current) use of insulin: Secondary | ICD-10-CM | POA: Insufficient documentation

## 2015-05-06 DIAGNOSIS — R55 Syncope and collapse: Secondary | ICD-10-CM | POA: Diagnosis not present

## 2015-05-06 DIAGNOSIS — Z951 Presence of aortocoronary bypass graft: Secondary | ICD-10-CM | POA: Insufficient documentation

## 2015-05-06 DIAGNOSIS — Z7982 Long term (current) use of aspirin: Secondary | ICD-10-CM | POA: Diagnosis not present

## 2015-05-06 DIAGNOSIS — R42 Dizziness and giddiness: Secondary | ICD-10-CM | POA: Diagnosis present

## 2015-05-06 DIAGNOSIS — Z79899 Other long term (current) drug therapy: Secondary | ICD-10-CM | POA: Insufficient documentation

## 2015-05-06 DIAGNOSIS — J189 Pneumonia, unspecified organism: Secondary | ICD-10-CM

## 2015-05-06 DIAGNOSIS — Z791 Long term (current) use of non-steroidal anti-inflammatories (NSAID): Secondary | ICD-10-CM | POA: Insufficient documentation

## 2015-05-06 DIAGNOSIS — I10 Essential (primary) hypertension: Secondary | ICD-10-CM | POA: Diagnosis not present

## 2015-05-06 DIAGNOSIS — E119 Type 2 diabetes mellitus without complications: Secondary | ICD-10-CM | POA: Diagnosis not present

## 2015-05-06 LAB — URINALYSIS, ROUTINE W REFLEX MICROSCOPIC
Bilirubin Urine: NEGATIVE
GLUCOSE, UA: 500 mg/dL — AB
KETONES UR: 15 mg/dL — AB
LEUKOCYTES UA: NEGATIVE
Nitrite: NEGATIVE
PH: 5 (ref 5.0–8.0)
Protein, ur: NEGATIVE mg/dL
Specific Gravity, Urine: 1.024 (ref 1.005–1.030)

## 2015-05-06 LAB — COMPREHENSIVE METABOLIC PANEL
ALK PHOS: 55 U/L (ref 38–126)
ALT: 24 U/L (ref 14–54)
ANION GAP: 13 (ref 5–15)
AST: 25 U/L (ref 15–41)
Albumin: 3.7 g/dL (ref 3.5–5.0)
BILIRUBIN TOTAL: 0.7 mg/dL (ref 0.3–1.2)
BUN: 15 mg/dL (ref 6–20)
CALCIUM: 10 mg/dL (ref 8.9–10.3)
CO2: 22 mmol/L (ref 22–32)
CREATININE: 1.27 mg/dL — AB (ref 0.44–1.00)
Chloride: 102 mmol/L (ref 101–111)
GFR, EST AFRICAN AMERICAN: 50 mL/min — AB (ref >60)
GFR, EST NON AFRICAN AMERICAN: 43 mL/min — AB (ref >60)
Glucose, Bld: 324 mg/dL — ABNORMAL HIGH (ref 65–99)
Potassium: 4.4 mmol/L (ref 3.5–5.1)
SODIUM: 137 mmol/L (ref 135–145)
TOTAL PROTEIN: 7.7 g/dL (ref 6.5–8.1)

## 2015-05-06 LAB — CBC
HEMATOCRIT: 39.9 % (ref 36.0–46.0)
Hemoglobin: 13.2 g/dL (ref 12.0–15.0)
MCH: 31.1 pg (ref 26.0–34.0)
MCHC: 33.1 g/dL (ref 30.0–36.0)
MCV: 94.1 fL (ref 78.0–100.0)
Platelets: 192 10*3/uL (ref 150–400)
RBC: 4.24 MIL/uL (ref 3.87–5.11)
RDW: 13.7 % (ref 11.5–15.5)
WBC: 11.9 10*3/uL — AB (ref 4.0–10.5)

## 2015-05-06 LAB — BASIC METABOLIC PANEL
BUN: 18 mg/dL (ref 7–25)
CALCIUM: 9.8 mg/dL (ref 8.6–10.4)
CO2: 25 mmol/L (ref 20–31)
Chloride: 103 mmol/L (ref 98–110)
Creat: 1.1 mg/dL — ABNORMAL HIGH (ref 0.50–0.99)
GLUCOSE: 211 mg/dL — AB (ref 65–99)
Potassium: 4.5 mmol/L (ref 3.5–5.3)
SODIUM: 137 mmol/L (ref 135–146)

## 2015-05-06 LAB — URINE MICROSCOPIC-ADD ON: WBC, UA: NONE SEEN WBC/hpf (ref 0–5)

## 2015-05-06 LAB — CBG MONITORING, ED
GLUCOSE-CAPILLARY: 319 mg/dL — AB (ref 65–99)
Glucose-Capillary: 324 mg/dL — ABNORMAL HIGH (ref 65–99)

## 2015-05-06 LAB — I-STAT CG4 LACTIC ACID, ED
LACTIC ACID, VENOUS: 2.1 mmol/L — AB (ref 0.5–2.0)
Lactic Acid, Venous: 2.5 mmol/L (ref 0.5–2.0)

## 2015-05-06 LAB — I-STAT TROPONIN, ED: TROPONIN I, POC: 0.02 ng/mL (ref 0.00–0.08)

## 2015-05-06 MED ORDER — AZITHROMYCIN 250 MG PO TABS: 250.0000 mg | ORAL_TABLET | Freq: Every day | ORAL | Status: DC

## 2015-05-06 MED ORDER — AZITHROMYCIN 250 MG PO TABS
500.0000 mg | ORAL_TABLET | Freq: Once | ORAL | Status: AC
  Administered 2015-05-06: 500 mg via ORAL
  Filled 2015-05-06: qty 2

## 2015-05-06 MED ORDER — ACETAMINOPHEN 325 MG PO TABS
ORAL_TABLET | ORAL | Status: AC
  Filled 2015-05-06: qty 2

## 2015-05-06 MED ORDER — SODIUM CHLORIDE 0.9 % IV BOLUS (SEPSIS)
1000.0000 mL | Freq: Once | INTRAVENOUS | Status: AC
  Administered 2015-05-06: 1000 mL via INTRAVENOUS

## 2015-05-06 MED ORDER — ACETAMINOPHEN 325 MG PO TABS
650.0000 mg | ORAL_TABLET | Freq: Once | ORAL | Status: AC | PRN
  Administered 2015-05-06: 650 mg via ORAL

## 2015-05-06 NOTE — Discharge Instructions (Signed)

## 2015-05-06 NOTE — ED Provider Notes (Signed)
CSN: [id number]     Arrival date & time 05/06/15  1816 History   First MD Initiated Contact with Patient 05/06/15 1932     Chief Complaint  Patient presents with  . Near Syncope   PT FELT LIGHTHEADED THIS AFTERNOON AND FELT LIKE SHE WAS GOING TO PASS OUT.  SHE SAID THAT SHE FEELS TIRED.  SHE DID HAVE A FEVER UPON ARRIVAL HERE AND WAS GIVEN TYLENOL.  PT C/O COLD SX.    (Consider location/radiation/quality/duration/timing/severity/associated sxs/prior Treatment) The history is provided by the patient.    Past Medical History  Diagnosis Date  . Coronary artery disease     s/p CABG 2007  . Sleep apnea     uses O2 at night  . Ischemic cardiomyopathy     EF 30-35%, s/p ICD 4/08 by Dr Leonia Reeves  . Diabetes mellitus     type II  . Hypertension   . Obesity   . CHF (congestive heart failure) ([place])   . Oxygen dependent 2 L  . Morbid obesity ([place]) 11/06/2012  . Angina decubitus ([place]) 11/06/2012  . 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 Chronic combined systolic and diastolic CHF, NYHA class 3 ([place])   . Angina pectoris ([place])   . Ventricular tachycardia ([place]) 01/16/15    sustained VT terminated with ATP, CL 340 msec   Past Surgical History  Procedure Laterality Date  . Cardiac catheterization    . Cardiac defibrillator placement  4/08    by Dr Leonia Reeves (MDT)  . Coronary artery bypass graft  10/05/2005    by Dr Cyndia Bent  . Implantable cardioverter defibrillator implant  09/25/12    attempt of upgrade to CRT-D unsuccessful due to CS anatomy, SJM Unify Asaura device placed with LV port capped by Dr Rayann Heman  . Bi-ventricular implantable cardioverter defibrillator upgrade N/A 09/25/2012    Procedure: BI-VENTRICULAR IMPLANTABLE CARDIOVERTER DEFIBRILLATOR UPGRADE;  Surgeon: Coralyn Mark, MD;  Location: [facility] CATH LAB;  Service: Cardiovascular;  Laterality: N/A;   Family History  Problem Relation Age of Onset   . Diabetes Mother 4    died - HTN  . Other      No early family hx of CAD   Social History  Substance Use Topics  . Smoking status: Never Smoker   . Smokeless tobacco: None  . Alcohol Use: No   OB History    No data available     Review of Systems  Constitutional: Positive for fever and fatigue.  All other systems reviewed and are negative.     Allergies  Metformin and related; Atorvastatin; and Levofloxacin  Home Medications   Prior to Admission medications   Medication Sig Start Date End Date Taking? Authorizing Provider  albuterol (PROVENTIL HFA;VENTOLIN HFA) 108 (90 BASE) MCG/ACT inhaler Inhale 2 puffs into the lungs every 6 (six) hours as needed for wheezing or shortness of breath.   Yes Historical Provider, MD  aspirin EC 81 MG EC tablet Take 1 tablet (81 mg total) by mouth daily. 09/11/12  Yes Jerline Pain, MD  carvedilol (COREG) 12.5 MG tablet Take 12.5 mg by mouth 2 (two) times daily with a meal.  03/30/15  Yes Historical Provider, MD  Choline Fenofibrate (FENOFIBRIC ACID) 135 MG CPDR Take 135 mg by mouth daily. 02/09/15  Yes Historical Provider, MD  furosemide (LASIX) 40 MG tablet Take 40 mg by  mouth 2 (two) times daily.    Yes Historical Provider, MD  glipiZIDE (GLUCOTROL) 5 MG tablet Take 5 mg by mouth daily before breakfast.    Yes Historical Provider, MD  HYDROcodone-acetaminophen (NORCO) 5-325 MG per tablet Take 1 tablet by mouth every 6 (six) hours as needed for severe pain. 06/04/14  Yes Julianne Rice, MD  insulin lispro (HUMALOG) 100 UNIT/ML injection Inject 50-100 Units into the skin 3 (three) times daily with meals. Use 100units in am, 50 units for lunch and 50 units in pm   Yes Historical Provider, MD  IRON PO Take 1 tablet by mouth daily.   Yes Historical Provider, MD  isosorbide dinitrate (ISORDIL) 30 MG tablet Take 1 tablet (30 mg total) by mouth daily. 05/30/14  Yes Barton Dubois, MD  ketoconazole (NIZORAL) 2 % cream Apply 1 application topically 2 (two)  times daily.  03/30/15  Yes Historical Provider, MD  latanoprost (XALATAN) 0.005 % ophthalmic solution Place 1 drop into both eyes at bedtime.   Yes Historical Provider, MD  lisinopril (PRINIVIL,ZESTRIL) 10 MG tablet Take 10 mg by mouth daily.    Yes Historical Provider, MD  meloxicam (MOBIC) 7.5 MG tablet Take 7.5 mg by mouth daily. 02/10/15  Yes Historical Provider, MD  methocarbamol (ROBAXIN) 500 MG tablet Take 1 tablet (500 mg total) by mouth every 8 (eight) hours as needed for muscle spasms. 05/30/14  Yes Barton Dubois, MD  potassium phosphate, monobasic, (K-PHOS ORIGINAL) 500 MG tablet Take 500 mg by mouth daily as needed (takes when needed for spasms).    Yes Historical Provider, MD  spironolactone (ALDACTONE) 25 MG tablet Take 0.5 tablets (12.5 mg total) by mouth daily. 03/28/15  Yes Scott T Kathlen Mody, PA-C  azithromycin (ZITHROMAX Z-PAK) 250 MG tablet Take 1 tablet (250 mg total) by mouth daily. 05/06/15   Isla Pence, MD   BP 133/73 mmHg  Pulse 76  Temp(Src) 98.5 F (36.9 C) (Oral)  Resp 22  SpO2 99% Physical Exam  Constitutional: She is oriented to person, place, and time. She appears well-developed and well-nourished.  HENT:  Head: Normocephalic and atraumatic.  Right Ear: External ear normal.  Left Ear: External ear normal.  Nose: Nose normal.  Mouth/Throat: Oropharynx is clear and moist.  Eyes: Conjunctivae and EOM are normal. Pupils are equal, round, and reactive to light.  Neck: Normal range of motion. Neck supple.  Cardiovascular: Normal rate, regular rhythm, normal heart sounds and intact distal pulses.   Pulmonary/Chest: Effort normal and breath sounds normal.  Abdominal: Soft. Bowel sounds are normal.  Musculoskeletal: Normal range of motion.  Neurological: She is alert and oriented to person, place, and time.  Skin: Skin is warm and dry.  Psychiatric: She has a normal mood and affect. Her behavior is normal. Judgment and thought content normal.  Nursing note and vitals  reviewed.   ED Course  Procedures (including critical care time) Labs Review Labs Reviewed  CBC - Abnormal; Notable for the following:    WBC 11.9 (*)    All other components within normal limits  URINALYSIS, ROUTINE W REFLEX MICROSCOPIC (NOT AT [facility]) - Abnormal; Notable for the following:    APPearance CLOUDY (*)    Glucose, UA 500 (*)    Hgb urine dipstick SMALL (*)    Ketones, ur 15 (*)    All other components within normal limits  COMPREHENSIVE METABOLIC PANEL - Abnormal; Notable for the following:    Glucose, Bld 324 (*)    Creatinine, Ser 1.27 (*)  GFR calc non Af Amer 43 (*)    GFR calc Af Amer 50 (*)    All other components within normal limits  URINE MICROSCOPIC-ADD ON - Abnormal; Notable for the following:    Squamous Epithelial / LPF 0-5 (*)    Bacteria, UA RARE (*)    Casts HYALINE CASTS (*)    All other components within normal limits  CBG MONITORING, ED - Abnormal; Notable for the following:    Glucose-Capillary 324 (*)    All other components within normal limits  I-STAT CG4 LACTIC ACID, ED - Abnormal; Notable for the following:    Lactic Acid, Venous 2.50 (*)    All other components within normal limits  CBG MONITORING, ED - Abnormal; Notable for the following:    Glucose-Capillary 319 (*)    All other components within normal limits  I-STAT CG4 LACTIC ACID, ED - Abnormal; Notable for the following:    Lactic Acid, Venous 2.10 (*)    All other components within normal limits  CULTURE, BLOOD (ROUTINE X 2)  CULTURE, BLOOD (ROUTINE X 2)  URINE CULTURE  I-STAT TROPOININ, ED    Imaging Review Dg Chest 2 View  05/06/2015  CLINICAL DATA:  Cough, shortness of breath and fever for 1 day. EXAM: CHEST  2 VIEW COMPARISON:  03/12/2015 FINDINGS: Dual lead left-sided pacemaker remains in place. Patient is post median sternotomy. Cardiomegaly is unchanged. Lung volumes are low. Ill-defined opacity in the right mid and lwoer lung zone. No pleural effusion or  pneumothorax. No evidence of acute osseous abnormality. IMPRESSION: Ill-defined opacity in the right mid and lower lung zone, atelectasis versus early pneumonia. Cardiomegaly is stable. Low lung volumes and body habitus limit assessment. Electronically Signed   By: Jeb Levering M.D.   On: 05/06/2015 19:31   I have personally reviewed and evaluated these images and lab results as part of my medical decision-making.   EKG Interpretation None      MDM   PT IS FEELING MUCH BETTER. HER LACTIC ACID IS DECREASING.  SHE IS EATING AND LOOKING COMFORTABLE.  SHE IS NOT HYPOXIC OR HYPOTENSIVE, SO NO NEED FOR ADMISSION. Final diagnoses:  Community acquired pneumonia      Isla Pence, MD 05/06/15 2259

## 2015-05-06 NOTE — ED Notes (Signed)
Pt CBG is 319 nurse notified

## 2015-05-06 NOTE — ED Notes (Signed)
Pt reports onset this afternoon of feeling lightheaded and had near syncopal episode. Feeling very fatigued but denies any pain. Reports sob. Grips are equal and no neuro deficits noted at triage. Report sob, spo2 96%. ekg done.

## 2015-05-06 NOTE — ED Notes (Signed)
Pt CBG, 324. Nurse was notified. 

## 2015-05-06 NOTE — ED Notes (Signed)
Pt. Verbalizes understanding of d/c instructions, prescriptions, and follow-up care.  Pt. displays no s/s of distress at this time. Pt. Verbalizes no questions or concerns at this time. VS stable. Pt. out of the unit by wheelchair with daughter at the side.

## 2015-05-06 NOTE — ED Notes (Signed)
Pt. States "Having a cold coming on" that has caused her to be weak and light-headed. Pt. States that she fell earlier today with no injury associated with fall. Pt. Denies LOC.

## 2015-05-08 LAB — URINE CULTURE
CULTURE: NO GROWTH
Special Requests: NORMAL

## 2015-05-11 LAB — CULTURE, BLOOD (ROUTINE X 2)
CULTURE: NO GROWTH
Culture: NO GROWTH

## 2015-05-13 ENCOUNTER — Other Ambulatory Visit: Payer: Medicare HMO

## 2015-05-18 ENCOUNTER — Ambulatory Visit (INDEPENDENT_AMBULATORY_CARE_PROVIDER_SITE_OTHER): Payer: Medicare HMO

## 2015-05-18 DIAGNOSIS — Z95810 Presence of automatic (implantable) cardiac defibrillator: Secondary | ICD-10-CM

## 2015-05-18 DIAGNOSIS — I5022 Chronic systolic (congestive) heart failure: Secondary | ICD-10-CM

## 2015-05-18 NOTE — Progress Notes (Signed)
EPIC Encounter for ICM Monitoring  Patient Name: [name] is a 65 y.o. female Date: 05/18/2015 Primary Care Physican: Imelda Pillow, NP Primary Cardiologist: Marlou Porch Electrophysiologist: Allred Dry Weight: Dose not weigh     In the past month, have you:  1. Gained more than 2 pounds in a day or more than 5 pounds in a week? unknown  2. Had changes in your medications (with verification of current medications)? no  3. Had more shortness of breath than is usual for you? no  4. Limited your activity because of shortness of breath? no  5. Not been able to sleep because of shortness of breath? no  6. Had increased swelling in your feet, ankles, legs or stomach area? no  7. Had symptoms of dehydration (dizziness, dry mouth, increased thirst, decreased urine output) no  8. Had changes in sodium restriction? no  9. Been compliant with medication? Yes  ICM trend: 3 month view for 05/18/2015   ICM trend: 1 year view for 05/28/2015   Follow-up plan: ICM clinic phone appointment 06/09/2015.    FLUID LEVELS:  Since last ICM transmission 05/05/2015, [place] thoracic impedance decreased 05/06/2015 to 05/11/2015 suggesting fluid accumulation and correlates with ED visit for pneumonia.  Impedance returned to baseline 05/15/2015 suggesting fluids have stabilized.   SYMPTOMS:   None today.  She is feeling much better and pneumonia has resolved. She has an appointment with [name], PA on 05/19/2015.   Encouraged to call for any fluid symptoms.  No changes today.    Patient had a question about when she could resume driving.  Per Dr Jackalyn Lombard note 01/17/2015, patient had sustained VT on 01/16/2015 that was successfully treated with ATP and she was hospitalized for syncope episode on 03/11/2015.  Advised would answer her question at the office appointment on 05/19/2015.  Per DMV, she cannot drive for 6 months after the syncope episode on 03/11/2015 which would be 09/11/2015.    Advised will send to  PA, [name], primary cardiologist and device following physician for review. Patient has appointment with [name], PA on 05/19/2015.      [name], RN, CCM 05/18/2015 12:25 PM

## 2015-05-19 ENCOUNTER — Encounter: Payer: Self-pay | Admitting: Physician Assistant

## 2015-05-19 ENCOUNTER — Ambulatory Visit (INDEPENDENT_AMBULATORY_CARE_PROVIDER_SITE_OTHER): Payer: Medicare HMO | Admitting: Physician Assistant

## 2015-05-19 VITALS — BP 110/62 | HR 82 | Ht 63.0 in | Wt 256.4 lb

## 2015-05-19 DIAGNOSIS — I255 Ischemic cardiomyopathy: Secondary | ICD-10-CM

## 2015-05-19 DIAGNOSIS — I5022 Chronic systolic (congestive) heart failure: Secondary | ICD-10-CM | POA: Diagnosis not present

## 2015-05-19 DIAGNOSIS — I2510 Atherosclerotic heart disease of native coronary artery without angina pectoris: Secondary | ICD-10-CM | POA: Diagnosis not present

## 2015-05-19 DIAGNOSIS — I10 Essential (primary) hypertension: Secondary | ICD-10-CM

## 2015-05-19 DIAGNOSIS — Z95810 Presence of automatic (implantable) cardiac defibrillator: Secondary | ICD-10-CM

## 2015-05-19 MED ORDER — NITROGLYCERIN 0.4 MG SL SUBL: 0.4000 mg | SUBLINGUAL_TABLET | SUBLINGUAL | Status: DC | PRN

## 2015-05-19 NOTE — Patient Instructions (Addendum)
Medication Instructions:  Your physician recommends that you continue on your current medications as directed. Please refer to the Current Medication list given to you today. Your Nitro-glycerin has been refilled today Labwork: Bmet today Testing/Procedures: None ordered Follow-Up: Follow up as planned in July 2017 with Dr.Skains Any Other Special Instructions Will Be Listed Below (If Applicable). If you need a refill on your cardiac medications before your next appointment, please call your pharmacy.

## 2015-05-19 NOTE — Progress Notes (Signed)
Thank you [name], PA-C   05/19/2015 5:29 PM

## 2015-05-19 NOTE — Progress Notes (Signed)
Cardiology Office Note:    Date:  05/19/2015   ID:  [name], DOB 10/26/1950, MRN [id number]  PCP:  Jamie Pillow, NP  Cardiologist: Dr. Candee Tran  Electrophysiologist: Dr. Thompson Tran   Referring Tran: Jamie Beals, NP   Chief Complaint  Patient presents with  . Congestive Heart Failure    Follow up    History of Present Illness:     [name] is a 65 y.o. female with a hx of CAD s/p CABG [id number], ICM, systolic HF, VTach, s/p ICD (CRT was unsuccessful due to vein slcerosis), OSA, DM2, HTN. She is followed in the [facility]. Last seen by Dr. Candee Tran in 11/16  Admitted 3/17 with syncope. She was found down by her children. Notes indicate she recalled feeling light-headed. Creatinine was elevated and she reported poor PO intake. She was given IVFs. Echo demonstrated improved LVF with EF 40-45%. BP was soft and medications were held. Notes indicate Lasix was resumed at DC. Coreg and Spironolactone were held at DC. Creatinine 2.05 >> 1.44 at DC. EDP notes from presentation indicate that ICD was interrogated and no abnormal activity was demonstrated. Overall, it was felt her picture was c/w dehydration.   I saw her last in 4/17. Follow-up ICM monitoring demonstrated volume excess and we adjusted her Lasix. She then presented to the emergency room 05/06/15 with community-acquired pneumonia. She was sent home on Zithromax. She returns for follow-up. Of note, last ICM monitoring 05/18/15 demonstrated thoracic impedance back to baseline demonstrating normal fluid status.    Here for follow-up. Overall, she is doing well. She has chronic dyspnea with exertion. She is NYHA 2b-3. She denies chest discomfort. She sleeps on 2 pillows. Denies PND. Denies significant edema. She has a chronic cough with clear to yellowish sputum production. This is unchanged. She denies hemoptysis, fevers.   Past Medical History  Diagnosis Date  . Coronary artery disease     s/p  CABG 2007  . Sleep apnea     uses O2 at night  . Ischemic cardiomyopathy     EF 30-35%, s/p ICD 4/08 by Dr [name]  . Diabetes mellitus     type II  . Hypertension   . Obesity   . CHF (congestive heart failure) ([place])   . Oxygen dependent 2 L  . Morbid obesity ([place]) 11/06/2012  . Angina decubitus ([place]) 11/06/2012  . 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 Chronic combined systolic and diastolic CHF, NYHA class 3 ([place])   . Angina pectoris ([place])   . Ventricular tachycardia ([place]) 01/16/15    sustained VT terminated with ATP, CL 340 msec    Past Surgical History  Procedure Laterality Date  . Cardiac catheterization    . Cardiac defibrillator placement  4/08    by Dr [name] (MDT)  . Coronary artery bypass graft  10/05/2005    by Dr [name]  . Implantable cardioverter defibrillator implant  09/25/12    attempt of upgrade to CRT-D unsuccessful due to CS anatomy, SJM Unify Asaura device placed with LV port capped by Dr [name]  . Bi-ventricular implantable cardioverter defibrillator upgrade N/A 09/25/2012    Procedure: BI-VENTRICULAR IMPLANTABLE CARDIOVERTER DEFIBRILLATOR UPGRADE;  Surgeon: Jamie Mark, Tran;  Location: [facility] CATH LAB;  Service: Cardiovascular;  Laterality: N/A;    Current Medications: Outpatient Prescriptions Prior to Visit  Medication Sig Dispense Refill  . albuterol (PROVENTIL HFA;VENTOLIN HFA) 108 (90 BASE) MCG/ACT inhaler Inhale 2 puffs into the lungs every 6 (six) hours as needed for wheezing or shortness of breath.    Jamie Kitchen aspirin EC 81 MG EC tablet Take 1 tablet (81 mg total) by mouth daily. 30 tablet 12  . azithromycin (ZITHROMAX Z-PAK) 250 MG tablet Take 1 tablet (250 mg total) by mouth daily. 4 tablet 0  . carvedilol (COREG) 12.5 MG tablet Take 12.5 mg by mouth 2 (two) times daily with a meal.     . Choline Fenofibrate (FENOFIBRIC ACID) 135 MG CPDR Take 135 mg by mouth daily.      . furosemide (LASIX) 40 MG tablet Take 40 mg by mouth 2 (two) times daily.     Jamie Kitchen glipiZIDE (GLUCOTROL) 5 MG tablet Take 5 mg by mouth daily before breakfast.     . HYDROcodone-acetaminophen (NORCO) 5-325 MG per tablet Take 1 tablet by mouth every 6 (six) hours as needed for severe pain. 20 tablet 0  . insulin lispro (HUMALOG) 100 UNIT/ML injection Inject 50-100 Units into the skin 3 (three) times daily with meals. Use 100units in am, 50 units for lunch and 50 units in pm    . IRON PO Take 1 tablet by mouth daily.    . isosorbide dinitrate (ISORDIL) 30 MG tablet Take 1 tablet (30 mg total) by mouth daily. 30 tablet 1  . ketoconazole (NIZORAL) 2 % cream Apply 1 application topically 2 (two) times daily.     Jamie Kitchen latanoprost (XALATAN) 0.005 % ophthalmic solution Place 1 drop into both eyes at bedtime.    Jamie Kitchen lisinopril (PRINIVIL,ZESTRIL) 10 MG tablet Take 10 mg by mouth daily.     . meloxicam (MOBIC) 7.5 MG tablet Take 7.5 mg by mouth daily.    . methocarbamol (ROBAXIN) 500 MG tablet Take 1 tablet (500 mg total) by mouth every 8 (eight) hours as needed for muscle spasms. 45 tablet 0  . potassium phosphate, monobasic, (K-PHOS ORIGINAL) 500 MG tablet Take 500 mg by mouth daily as needed (takes when needed for spasms).     Jamie Kitchen spironolactone (ALDACTONE) 25 MG tablet Take 0.5 tablets (12.5 mg total) by mouth daily. 30 tablet 11   No facility-administered medications prior to visit.      Allergies:   Metformin and related; Atorvastatin; and Levofloxacin   Social History   Social History  . Marital Status: Married    Spouse Name: N/A  . Number of Children: 3  . Years of Education: N/A   Occupational History  . HAIRDRESSER    Social History Main Topics  . Smoking status: Never Smoker   . Smokeless tobacco: None  . Alcohol Use: No  . Drug Use: No  . Sexual Activity: Not Currently   Other Topics Concern  . None   Social History Narrative   Disabled Emergency planning/management officer.  Currently taking sociology  classes at [employer].     Family History:  The patient's family history includes Diabetes (age of onset: 87) in her mother.   ROS:   Please see the history of present illness.    Review of Systems  Respiratory: Positive for cough.   Musculoskeletal: Positive for back pain.  Psychiatric/Behavioral: The patient is nervous/anxious.    All other systems reviewed and are negative.   Physical Exam:    VS:  BP 110/62 mmHg  Pulse 82  Ht 5\' 3"  (1.6 m)  Wt 256 lb 6.4 oz (116.302 kg)  BMI 45.43 kg/m2  SpO2 94%   GEN: Well nourished, well developed, in no acute distress HEENT: normal Neck: no JVD at 90, no masses Cardiac: Normal S1/S2, RRR; no murmurs, rubs, or gallops, no edema;     Respiratory:  clear to auscultation bilaterally; no wheezing, rhonchi or rales GI: soft, nontender, nondistended MS: no deformity or atrophy Skin: warm and dry Neuro: No focal deficits  Psych: Alert and oriented x 3, normal affect  Wt Readings from Last 3 Encounters:  05/19/15 256 lb 6.4 oz (116.302 kg)  04/21/15 253 lb 12.8 oz (115.123 kg)  03/21/15 257 lb 1.9 oz (116.629 kg)      Studies/Labs Reviewed:     EKG:  EKG is  ordered today.  The ekg ordered today demonstrates NSR, HR 83, LBBB  Recent Labs: 06/04/2014: B Natriuretic Peptide 141.6* 03/12/2015: TSH 1.786 05/06/2015: ALT 24; BUN 15; Creatinine, Ser 1.27*; Hemoglobin 13.2; Platelets 192; Potassium 4.4; Sodium 137   Recent Lipid Panel    Component Value Date/Time   CHOL 90 12/22/2012 1042   CHOL 138 04/25/2012 0520   TRIG 85 12/22/2012 1042   TRIG 113 04/25/2012 0520   HDL 33* 12/22/2012 1042   HDL 33* 04/25/2012 0520   CHOLHDL 4.2 04/25/2012 0520   VLDL 23 04/25/2012 0520   LDLCALC 40 12/22/2012 1042   LDLCALC 82 04/25/2012 0520    Additional studies/ records that were reviewed today include:   Echo 03/13/15 Mild to mod LVH, EF 40-45%, no RWMA, Gr 1 DD  Myoview 5/16  Findings consistent with prior myocardial infarction.  This is  a high risk study.  The left ventricular ejection fraction is severely decreased (<30%).  Defect 1: There is a large defect of severe severity present in the mid inferoseptal and apex location.  [place] 12/11 EF 30-35% L-D2 patent S-RI patent S-OM1/OM2 patent S-PDA patent   ASSESSMENT:     1. Chronic systolic CHF (congestive heart failure) ([place])   2. Ischemic cardiomyopathy   3. Coronary artery disease involving native coronary artery of native heart without angina pectoris   4. Essential hypertension   5. ICD (implantable cardioverter-defibrillator), dual, in situ     PLAN:     In order of problems listed above:  1. Chronic systolic CHF - NYHA 2b-3. Volume appears stable. Recent ICM monitoring with baseline fluid status.  Continue current dose of Lasix and Coreg, Lisinopril, Spiro, nitrates. - EF > 40% so no indication for Entresto. - HR elevated today. No indication for Ivabridine with EF > 35%.  -  BMET today.    2. Ischemic CM - EF improved to 40-45% by recent Echo.   3. CAD - s/p CABG. LHC in 2/011 with patent grafts. Myoview in 2016 with inf-septal and apical scar. Med Rx continued. No angina. Continue ASA. She is statin intol.   4. HTN - BP controlled.   5. S/p ICD - FU with EP as planned.    Medication Adjustments/Labs and Tests Ordered: Current medicines are reviewed at length with the patient today.  Concerns regarding medicines are outlined above.  Medication changes, Labs and Tests ordered today are outlined in the Patient Instructions noted below. Patient Instructions  Medication Instructions:  Your physician recommends that you continue on your current medications as directed. Please refer to the Current Medication list given to you today. Your Nitro-glycerin has been refilled today Labwork: Bmet today Testing/Procedures: None ordered Follow-Up: Follow up as planned in July 2017 with Dr.Skains Any Other Special  Instructions Will Be Listed Below (If Applicable). If you need a refill on your cardiac medications before your next appointment, please call your pharmacy.    Signed, [name], PA-C  05/19/2015 3:03 PM    [facility] [place], [place],   [address] Phone: [phone]; Fax: [phone]

## 2015-05-20 ENCOUNTER — Telehealth: Payer: Self-pay | Admitting: *Deleted

## 2015-05-20 LAB — BASIC METABOLIC PANEL
BUN: 16 mg/dL (ref 7–25)
CO2: 25 mmol/L (ref 20–31)
Calcium: 9.2 mg/dL (ref 8.6–10.4)
Chloride: 101 mmol/L (ref 98–110)
Creat: 1.11 mg/dL — ABNORMAL HIGH (ref 0.50–0.99)
GLUCOSE: 291 mg/dL — AB (ref 65–99)
POTASSIUM: 4.1 mmol/L (ref 3.5–5.3)
SODIUM: 138 mmol/L (ref 135–146)

## 2015-05-20 NOTE — Telephone Encounter (Signed)
Pt has been notified of lab results by phone with verbal understanding. 

## 2015-05-27 ENCOUNTER — Telehealth: Payer: Self-pay | Admitting: Physician Assistant

## 2015-05-27 NOTE — Telephone Encounter (Signed)
Please tell patient I reviewed with Dr. Thompson Grayer. She can drive 6 mos after her last treated episode (ventricular arrhythmia). If nothing has occurred since January, she can resume driving in July [id number]. Have her get a device check in June to see if there have been any episodes since last episode recorded. If no episodes recorded, she can resume driving in July. [name], PA-C   05/27/2015 2:05 PM

## 2015-05-30 ENCOUNTER — Telehealth: Payer: Self-pay | Admitting: Internal Medicine

## 2015-05-30 NOTE — Telephone Encounter (Signed)
Liliane Shi, PA-C at 05/27/2015 2:03 PM     Status: Signed       Expand All Collapse All   Please tell patient I reviewed with Dr. Thompson Grayer. She can drive 6 mos after her last treated episode (ventricular arrhythmia). If nothing has occurred since January, she can resume driving in July [id number]. Have her get a device check in June to see if there have been any episodes since last episode recorded. If no episodes recorded, she can resume driving in July. [name], PA-C  05/27/2015 2:05 PM

## 2015-05-30 NOTE — Telephone Encounter (Signed)
New problem   Pt want to know when is she able to drive.please advise

## 2015-05-31 NOTE — Telephone Encounter (Signed)
Spoke with patient and let her know recommendations.  Will have Melissa call and schedule her for divice clinic per Allred in June to assess for ventricular arrhythmias

## 2015-06-02 ENCOUNTER — Other Ambulatory Visit: Payer: Self-pay | Admitting: Cardiology

## 2015-06-09 ENCOUNTER — Ambulatory Visit (INDEPENDENT_AMBULATORY_CARE_PROVIDER_SITE_OTHER): Payer: Medicare HMO

## 2015-06-09 ENCOUNTER — Telehealth: Payer: Self-pay | Admitting: Cardiology

## 2015-06-09 DIAGNOSIS — I5022 Chronic systolic (congestive) heart failure: Secondary | ICD-10-CM | POA: Diagnosis not present

## 2015-06-09 DIAGNOSIS — Z95810 Presence of automatic (implantable) cardiac defibrillator: Secondary | ICD-10-CM | POA: Diagnosis not present

## 2015-06-09 NOTE — Telephone Encounter (Signed)
Spoke with pt and reminded pt of remote transmission that is due today. Pt verbalized understanding.   

## 2015-06-10 NOTE — Progress Notes (Signed)
EPIC Encounter for ICM Monitoring  Patient Name: [name] is a 65 y.o. female Date: 06/10/2015 Primary Care Physican: Imelda Pillow, NP Primary Cardiologist: Marlou Porch Electrophysiologist: Allred Dry Weight: unknown      In the past month, have you:  1. Gained more than 2 pounds in a day or more than 5 pounds in a week? unknown  2. Had changes in your medications (with verification of current medications)? no  3. Had more shortness of breath than is usual for you? no  4. Limited your activity because of shortness of breath? no  5. Not been able to sleep because of shortness of breath? no  6. Had increased swelling in your feet, ankles, legs or stomach area? no  7. Had symptoms of dehydration (dizziness, dry mouth, increased thirst, decreased urine output) no  8. Had changes in sodium restriction? no  9. Been compliant with medication? Yes  ICM trend: 3 month view for 06/09/2015   ICM trend: 1 year view for 06/09/2015   Follow-up plan: ICM clinic phone appointment 07/18/2015.  Office appointment with Dr Rayann Heman 06/13/2015 and Dr Marlou Porch 07/19/2015.     FLUID LEVELS:  [place] thoracic impedance increased above reference line 05/28/2015 to 06/09/2015 suggesting dryness.    SYMPTOMS:  None.  Denied any symptoms such as weight gain of 3 pounds overnight or 5 pounds within a week, SOB and/or lower extremity swelling. Encouraged to call for any fluid symptoms.   EDUCATION: Encouraged her to drink enough fluids, up 64 oz daily.     RECOMMENDATIONS: No changes today.    Advised will send to PCP, Dr. Marlou Porch and Dr. Rayann Heman for review since patient has office visits 06/13/2015 and 07/19/2015. Marland Kitchen    [name], RN, CCM 06/10/2015 1:05 PM

## 2015-06-13 NOTE — Telephone Encounter (Signed)
Unable to clear to drive at this time ([id number]) due to last treated ventricular arrhythmia being 01/16/15. I will call [name] to schedule a remote transmission on 07/16/15 and if no arrhythmias have occurred/been treated since 01/16/15 she will be cleared to drive at that time.

## 2015-06-21 ENCOUNTER — Telehealth: Payer: Self-pay | Admitting: Internal Medicine

## 2015-06-21 MED ORDER — FUROSEMIDE 40 MG PO TABS: 40.0000 mg | ORAL_TABLET | Freq: Two times a day (BID) | ORAL | Status: DC

## 2015-06-21 NOTE — Telephone Encounter (Signed)
New message      Pt c/o swelling: STAT is pt has developed SOB within 24 hours  1. How long have you been experiencing swelling?  About 1 week 2. Where is the swelling located?  Rt leg 3.  Are you currently taking a "fluid pill"? yes 4.  Are you currently SOB?  no  5.  Have you traveled recently?

## 2015-06-21 NOTE — Telephone Encounter (Signed)
Spoke with pt who reports she has had swelling in her right foot and leg for several days.  She denies any other s/s - no SOB or cough, no wt gain.  She states she has been keeping her leg elevated as much as possible but during the day her swelling returns.  It almost completely resolves overnight.  She has not been weighing herself daily but feels it is stable. Usually around 158 lbs.  She increased her Furosemide from 40 mg BID as ordered to 80 MG TID and has done that for 2 days but doesn't see any lasting improvement.  Advised pt I will review with Dr Marlou Porch however for now she should return to 40 mg BID as ordered.  She should continue to elevate it as much as possible during the day and should obtain knee high compression hose to wear daily.  Advised I will call her back with any new orders.

## 2015-06-22 NOTE — Telephone Encounter (Signed)
I would be fine with her trying Lasix 80 mg 3 times a day for 3 days straight. Once again counsel on fluids and salt. After these 3 days, go back to 40 mg twice a day.  Candee Furbish, MD

## 2015-06-23 MED ORDER — FUROSEMIDE 80 MG PO TABS: 80.0000 mg | ORAL_TABLET | Freq: Two times a day (BID) | ORAL | Status: DC

## 2015-06-23 NOTE — Telephone Encounter (Signed)
Reviewed again with Dr Marlou Porch who gave orders for pt to take 80 mg twice a day.  New RX sent into pharmacy.  She will f/u as scheduled but call back before if further needs.

## 2015-06-24 ENCOUNTER — Telehealth: Payer: Self-pay | Admitting: Cardiology

## 2015-06-24 NOTE — Telephone Encounter (Signed)
[name] is calling to get clarification on the furosemide they a 40mg  and 80mg  and is wanting to know the correct mg's. Please call   Thanks

## 2015-06-24 NOTE — Telephone Encounter (Signed)
Reviewed with pharmacy Furosemide is to be 80 mg twice a day.

## 2015-07-15 ENCOUNTER — Ambulatory Visit (INDEPENDENT_AMBULATORY_CARE_PROVIDER_SITE_OTHER): Payer: Medicare HMO

## 2015-07-15 ENCOUNTER — Telehealth: Payer: Self-pay | Admitting: Cardiology

## 2015-07-15 ENCOUNTER — Telehealth: Payer: Self-pay | Admitting: *Deleted

## 2015-07-15 DIAGNOSIS — Z95810 Presence of automatic (implantable) cardiac defibrillator: Secondary | ICD-10-CM | POA: Diagnosis not present

## 2015-07-15 DIAGNOSIS — I5022 Chronic systolic (congestive) heart failure: Secondary | ICD-10-CM

## 2015-07-15 NOTE — Telephone Encounter (Signed)
Informed patient that she has not had anymore treated ventricular arrhythmias since her episode in January, so at this point she is able to drive. Patient voiced understanding.

## 2015-07-15 NOTE — Telephone Encounter (Signed)
Spoke with pt and reminded pt of remote transmission that is due today. Pt verbalized understanding.   

## 2015-07-15 NOTE — Progress Notes (Signed)
EPIC Encounter for ICM Monitoring  Patient Name: [name] is a 65 y.o. female Date: 07/15/2015 Primary Care Physican: Imelda Pillow, NP Primary Cardiologist: Marlou Porch Electrophysiologist: Allred Dry Weight: unknown       Heart Failure questions reviewed, pt asymptomatic.   Thoracic impedence has been along reference line since 07/10/2015.  She had leg swelling on 06/21/2015 and impedance was below reference line 06/10/2015 to 06/19/2015, 07/06/2015 to 07/10/2015. Low sodium diet education provided Recommendations: None.  Office appointment with Dr Marlou Porch on 07/19/2015.    ICM trend: 07/15/2015      Follow-up plan: ICM clinic phone appointment on 08/15/2015.  Copy of ICM check sent to primary cardiologist and device physician.   [name], RN 07/15/2015 1:34 PM

## 2015-07-19 ENCOUNTER — Encounter: Payer: Medicare HMO | Admitting: Cardiology

## 2015-07-19 NOTE — Progress Notes (Signed)
[place]. [address], [address], [place]  [address] Phone: [phone] Fax:  [phone]  Date:  07/19/2015   ID:  [name], DOB [birth year], MRN [id number]  PCP:  Jamie Pillow, NP   History of Present Illness: [name] is a 65 y.o. female with cardiomyopathy EF 25-30%, ICD, CABG 2007, cardiac catheterization December of 2011 with widely patent grafts, stress test 2013 large defect prior infarct pattern, EF 40-45%, prior 30% LHC 12/2009  L-D2 patent S-RI patent S-OM1/OM2 patent S-PDA patent.  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 She also saw Dr. Roxy Tran, not a candidate for epicardial lead.  Admitted 03/2015 with syncope, found down by children. Creat 2 to 1.44 at DC. ICD normal interrogation. She was dehydrated.   No change in shortness of breath. No chest pain.  Overall I'm pleased with her progress. She is maintaining her weight. No change in shortness of breath, no chest pain. She is not taking a statin, retry but she developed a rash on her stomach and back. Her LDL has been 40 in the past.  Wt Readings from Last 3 Encounters:  05/19/15 256 lb 6.4 oz (116.302 kg)  04/21/15 253 lb 12.8 oz (115.123 kg)  03/21/15 257 lb 1.9 oz (116.629 kg)     Past Medical History  Diagnosis Date  . Coronary artery disease     s/p CABG 2007  . Sleep apnea     uses O2 at night  . Ischemic cardiomyopathy     EF 30-35%, s/p ICD 4/08 by Dr [name]  . Diabetes mellitus     type II  . Hypertension   . Obesity   . CHF (congestive heart failure) ([place])   . Oxygen dependent 2 L  . Morbid obesity ([place]) 11/06/2012  . Angina decubitus ([place]) 11/06/2012  . 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 Chronic combined systolic and diastolic CHF, NYHA class 3 ([place])   . Angina pectoris ([place])   . Ventricular tachycardia ([place]) 01/16/15    sustained VT terminated with ATP, CL 340 msec     Past Surgical History  Procedure Laterality Date  . Cardiac catheterization    . Cardiac defibrillator placement  4/08    by Dr [name] (MDT)  . Coronary artery bypass graft  10/05/2005    by Dr [name]  . Implantable cardioverter defibrillator implant  09/25/12    attempt of upgrade to CRT-D unsuccessful due to CS anatomy, SJM Unify Asaura device placed with LV port capped by Dr [name]  . Bi-ventricular implantable cardioverter defibrillator upgrade N/A 09/25/2012    Procedure: BI-VENTRICULAR IMPLANTABLE CARDIOVERTER DEFIBRILLATOR UPGRADE;  Surgeon: Jamie Mark, MD;  Location: [facility] CATH LAB;  Service: Cardiovascular;  Laterality: N/A;    Current Outpatient Prescriptions  Medication Sig Dispense Refill  . albuterol (PROVENTIL HFA;VENTOLIN HFA) 108 (90 BASE) MCG/ACT inhaler Inhale 2 puffs into the lungs every 6 (six) hours as needed for wheezing or shortness of breath.    Marland Kitchen aspirin EC 81 MG EC tablet Take 1 tablet (81 mg total) by mouth daily. 30 tablet 12  . azithromycin (ZITHROMAX Z-PAK) 250 MG tablet Take 1 tablet (250 mg total) by mouth daily. 4 tablet 0  . carvedilol (COREG) 12.5 MG tablet Take 12.5 mg by mouth 2 (two) times daily with a meal.     .  Choline Fenofibrate (FENOFIBRIC ACID) 135 MG CPDR Take 135 mg by mouth daily.    . fluconazole (DIFLUCAN) 150 MG tablet Take 150 mg by mouth daily.     . furosemide (LASIX) 80 MG tablet Take 1 tablet (80 mg total) by mouth 2 (two) times daily. 60 tablet 6  . glipiZIDE (GLUCOTROL) 5 MG tablet Take 5 mg by mouth daily before breakfast.     . HYDROcodone-acetaminophen (NORCO) 5-325 MG per tablet Take 1 tablet by mouth every 6 (six) hours as needed for severe pain. 20 tablet 0  . insulin lispro (HUMALOG) 100 UNIT/ML injection Inject 50-100 Units into the skin 3 (three) times daily with meals. Use 100units in am, 50 units for lunch and 50 units in pm    . IRON PO Take 1 tablet by mouth daily.    . isosorbide dinitrate (ISORDIL) 30 MG tablet  Take 1 tablet (30 mg total) by mouth daily. 30 tablet 1  . ketoconazole (NIZORAL) 2 % cream Apply 1 application topically 2 (two) times daily.     Marland Kitchen latanoprost (XALATAN) 0.005 % ophthalmic solution Place 1 drop into both eyes at bedtime.    Marland Kitchen lisinopril (PRINIVIL,ZESTRIL) 10 MG tablet Take 10 mg by mouth daily.     . meloxicam (MOBIC) 7.5 MG tablet Take 7.5 mg by mouth daily.    . methocarbamol (ROBAXIN) 500 MG tablet Take 1 tablet (500 mg total) by mouth every 8 (eight) hours as needed for muscle spasms. 45 tablet 0  . nitroGLYCERIN (NITROSTAT) 0.4 MG SL tablet Place 1 tablet (0.4 mg total) under the tongue every 5 (five) minutes as needed. 25 tablet 3  . potassium phosphate, monobasic, (K-PHOS ORIGINAL) 500 MG tablet Take 500 mg by mouth daily as needed (takes when needed for spasms).     Marland Kitchen spironolactone (ALDACTONE) 25 MG tablet Take 0.5 tablets (12.5 mg total) by mouth daily. 30 tablet 11   No current facility-administered medications for this visit.    Allergies:    Allergies  Allergen Reactions  . Metformin And Related Itching, Swelling and Other (See Comments)    Leg pain & swelling in legs  . Atorvastatin Itching and Rash  . Levofloxacin Itching    Social History:  The patient  reports that she has never smoked. She does not have any smokeless tobacco history on file. She reports that she does not drink alcohol or use illicit drugs.   ROS:  Please see the history of present illness.   Denies any syncope, bleeding, orthopnea, PND  PHYSICAL EXAM: VS:  There were no vitals taken for this visit. Well nourished, well developed, in no acute distress HEENT: normal Neck: no JVD Cardiac:  normal S1, S2; RRR; no murmur Lungs:  clear to auscultation bilaterally, no wheezing, rhonchi or rales Abd: soft, nontender, no hepatomegalyObese Ext: no edema Skin: warm and dry Neuro: no focal abnormalities noted  EKG: 11/06/12: Left bundle branch block, 68, sinus rhythm, no change from  prior 06/10/13-sinus rhythm, 63, left bundle branch block  Cardiac angiogram-2011-widely patent grafts LHC 12/11 EF 30-35% L-D2 patent S-RI patent S-OM1/OM2 patent S-PDA patent  Nuclear stress test-2013-no ischemia, old infarct, EF 31%  Echo 03/13/15 Mild to mod LVH, EF 40-45%, no RWMA, Gr 1 DD  Myoview 5/16  Findings consistent with prior myocardial infarction.  This is a high risk study.  The left ventricular ejection fraction is severely decreased (<30%).  Defect 1: There is a large defect of severe severity present in the  mid inferoseptal and apex location.  ASSESSMENT AND PLAN:  1. Chronic systolic heart failure-fairly well compensated. NYHA class 2-3. Has continued shortness of breath, but no significant change. Continue to advocate dietary compliance, low-salt diet, fluid restrictions. Continue with currently 6 dose 2. Left bundle branch block-unable to place biventricular lead due to vein sclerosis. Not a candidate for epicardial lead. ICD functioning well. 3. Not on statin because of "breaking out" rash. Stomach and back.  4. Morbid obesity-encourage weight loss. Low carbohydrate diet.Discussed at length today. 5. Angina-chest pain with exertion has improved greatly with addition of isosorbide. Prior catheterization reassuring. Nuclear stress test reassuring. 6. Hypertension-currently well controlled check a basic metabolic profile, on spironolactone. Prior creatinine 0.9 Prior potassium 4.0 7. Diabetes-continue to work hard with your primary physician. 8. We will see her back in four months, [name].   Signed, Candee Furbish, MD [facility]  07/19/2015 7:14 AM    This encounter was created in error - please disregard.

## 2015-07-20 ENCOUNTER — Encounter (HOSPITAL_COMMUNITY): Payer: Self-pay

## 2015-07-20 ENCOUNTER — Emergency Department (HOSPITAL_COMMUNITY)
Admission: EM | Admit: 2015-07-20 | Discharge: 2015-07-20 | Disposition: A | Discharge status: Home / Self Care | Payer: Medicare HMO | Attending: Emergency Medicine | Admitting: Emergency Medicine

## 2015-07-20 DIAGNOSIS — M25462 Effusion, left knee: Secondary | ICD-10-CM | POA: Insufficient documentation

## 2015-07-20 DIAGNOSIS — I5042 Chronic combined systolic (congestive) and diastolic (congestive) heart failure: Secondary | ICD-10-CM | POA: Insufficient documentation

## 2015-07-20 DIAGNOSIS — M25562 Pain in left knee: Secondary | ICD-10-CM | POA: Diagnosis not present

## 2015-07-20 DIAGNOSIS — I2510 Atherosclerotic heart disease of native coronary artery without angina pectoris: Secondary | ICD-10-CM | POA: Diagnosis not present

## 2015-07-20 DIAGNOSIS — E119 Type 2 diabetes mellitus without complications: Secondary | ICD-10-CM | POA: Diagnosis not present

## 2015-07-20 DIAGNOSIS — Z794 Long term (current) use of insulin: Secondary | ICD-10-CM | POA: Diagnosis not present

## 2015-07-20 DIAGNOSIS — Z95810 Presence of automatic (implantable) cardiac defibrillator: Secondary | ICD-10-CM | POA: Insufficient documentation

## 2015-07-20 DIAGNOSIS — Z7984 Long term (current) use of oral hypoglycemic drugs: Secondary | ICD-10-CM | POA: Insufficient documentation

## 2015-07-20 DIAGNOSIS — Z951 Presence of aortocoronary bypass graft: Secondary | ICD-10-CM | POA: Diagnosis not present

## 2015-07-20 DIAGNOSIS — I110 Hypertensive heart disease with heart failure: Secondary | ICD-10-CM | POA: Diagnosis not present

## 2015-07-20 DIAGNOSIS — Z7982 Long term (current) use of aspirin: Secondary | ICD-10-CM | POA: Diagnosis not present

## 2015-07-20 NOTE — ED Provider Notes (Signed)
CSN: [id number]     Arrival date & time 07/20/15  1527 History  By signing my name below, I, [name], attest that this documentation has been prepared under the direction and in the presence of Shary Decamp PA-C.  Electronically Signed: Meriel Tran, ED Scribe. 07/20/2015. 4:26 PM     Chief Complaint  Patient presents with  . Knee Pain   The history is provided by the patient. No language interpreter was used.   HPI Comments: [name] is a 65 y.o. female with a PMHx of CAD, HTN, DM, CHF, who presents to the Emergency Department complaining of gradually worsening 9/10, left medial knee pain onset two days ago. Pt reports she has had pain in both of her knees for one month. Pt states she was seen at [facility] two days ago and was told she had arthritis in both knees; pt was given a Cortisone injection to her left knee. Pt states her pain is exacerbated by bearing weight on the extremity and reports difficulty ambulating. Pt reports she has had no relief since the Cortisone injection as well as no relief from taking a previously prescribed pain medication that she was unable to recall the name of. Pt denies any recent fall or injury after the appointment. Pt denies any application of heat but states she has been keeping the leg elevated. Pt states that her PCP is [facility] but has no scheduled appointment. Pt denies fever.  Past Medical History  Diagnosis Date  . Coronary artery disease     s/p CABG 2007  . Sleep apnea     uses O2 at night  . Ischemic cardiomyopathy     EF 30-35%, s/p ICD 4/08 by Dr Leonia Reeves  . Diabetes mellitus     type II  . Hypertension   . Obesity   . CHF (congestive heart failure) ([place])   . Oxygen dependent 2 L  . Morbid obesity ([place]) 11/06/2012  . Angina decubitus ([place]) 11/06/2012  . 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 Chronic combined systolic  and diastolic CHF, NYHA class 3 ([place])   . Angina pectoris ([place])   . Ventricular tachycardia ([place]) 01/16/15    sustained VT terminated with ATP, CL 340 msec   Past Surgical History  Procedure Laterality Date  . Cardiac catheterization    . Cardiac defibrillator placement  4/08    by Dr Leonia Reeves (MDT)  . Coronary artery bypass graft  10/05/2005    by Dr Cyndia Bent  . Implantable cardioverter defibrillator implant  09/25/12    attempt of upgrade to CRT-D unsuccessful due to CS anatomy, SJM Unify Asaura device placed with LV port capped by Dr Rayann Heman  . Bi-ventricular implantable cardioverter defibrillator upgrade N/A 09/25/2012    Procedure: BI-VENTRICULAR IMPLANTABLE CARDIOVERTER DEFIBRILLATOR UPGRADE;  Surgeon: Coralyn Mark, MD;  Location: [facility] CATH LAB;  Service: Cardiovascular;  Laterality: N/A;   Family History  Problem Relation Age of Onset  . Diabetes Mother 20    died - HTN  . Other      No early family hx of CAD   Social History  Substance Use Topics  . Smoking status: Never Smoker   . Smokeless tobacco: None  . Alcohol Use: No   OB History    No data available     Review of Systems  Constitutional: Negative for fever.  Musculoskeletal: Positive for arthralgias (bilateral knees, left worse than right).  All other systems reviewed and are negative.  Allergies  Metformin and related; Atorvastatin; and Levofloxacin  Home Medications   Prior to Admission medications   Medication Sig Start Date End Date Taking? Authorizing Provider  albuterol (PROVENTIL HFA;VENTOLIN HFA) 108 (90 BASE) MCG/ACT inhaler Inhale 2 puffs into the lungs every 6 (six) hours as needed for wheezing or shortness of breath.    Historical Provider, MD  aspirin EC 81 MG EC tablet Take 1 tablet (81 mg total) by mouth daily. 09/11/12   Jerline Pain, MD  azithromycin (ZITHROMAX Z-PAK) 250 MG tablet Take 1 tablet (250 mg total) by mouth daily. 05/06/15   Isla Pence, MD  carvedilol (COREG) 12.5 MG tablet Take  12.5 mg by mouth 2 (two) times daily with a meal.  03/30/15   Historical Provider, MD  Choline Fenofibrate (FENOFIBRIC ACID) 135 MG CPDR Take 135 mg by mouth daily. 02/09/15   Historical Provider, MD  fluconazole (DIFLUCAN) 150 MG tablet Take 150 mg by mouth daily.  05/15/15   Historical Provider, MD  furosemide (LASIX) 80 MG tablet Take 1 tablet (80 mg total) by mouth 2 (two) times daily. 06/23/15   Jerline Pain, MD  glipiZIDE (GLUCOTROL) 5 MG tablet Take 5 mg by mouth daily before breakfast.     Historical Provider, MD  HYDROcodone-acetaminophen (NORCO) 5-325 MG per tablet Take 1 tablet by mouth every 6 (six) hours as needed for severe pain. 06/04/14   Julianne Rice, MD  insulin lispro (HUMALOG) 100 UNIT/ML injection Inject 50-100 Units into the skin 3 (three) times daily with meals. Use 100units in am, 50 units for lunch and 50 units in pm    Historical Provider, MD  IRON PO Take 1 tablet by mouth daily.    Historical Provider, MD  isosorbide dinitrate (ISORDIL) 30 MG tablet Take 1 tablet (30 mg total) by mouth daily. 05/30/14   Barton Dubois, MD  ketoconazole (NIZORAL) 2 % cream Apply 1 application topically 2 (two) times daily.  03/30/15   Historical Provider, MD  latanoprost (XALATAN) 0.005 % ophthalmic solution Place 1 drop into both eyes at bedtime.    Historical Provider, MD  lisinopril (PRINIVIL,ZESTRIL) 10 MG tablet Take 10 mg by mouth daily.     Historical Provider, MD  meloxicam (MOBIC) 7.5 MG tablet Take 7.5 mg by mouth daily. 02/10/15   Historical Provider, MD  methocarbamol (ROBAXIN) 500 MG tablet Take 1 tablet (500 mg total) by mouth every 8 (eight) hours as needed for muscle spasms. 05/30/14   Barton Dubois, MD  nitroGLYCERIN (NITROSTAT) 0.4 MG SL tablet Place 1 tablet (0.4 mg total) under the tongue every 5 (five) minutes as needed. 05/19/15   Liliane Shi, PA-C  potassium phosphate, monobasic, (K-PHOS ORIGINAL) 500 MG tablet Take 500 mg by mouth daily as needed (takes when needed for  spasms).     Historical Provider, MD  spironolactone (ALDACTONE) 25 MG tablet Take 0.5 tablets (12.5 mg total) by mouth daily. 03/28/15   Scott T Weaver, PA-C   BP 123/66 mmHg  Pulse 75  Temp(Src) 98.6 F (37 C) (Oral)  Resp 18  SpO2 97%   Physical Exam  Constitutional: She is oriented to person, place, and time. She appears well-developed and well-nourished. No distress.  HENT:  Head: Normocephalic and atraumatic.  Eyes: EOM are normal.  Neck: Normal range of motion.  Cardiovascular: Normal rate and regular rhythm.  Pulmonary/Chest: Effort normal.  Abdominal: Soft.  Musculoskeletal: Normal range of motion. She exhibits edema and tenderness.  Mild swelling to left knee no erythema, no signs of infection. Compartments soft. TTP on medial aspect but has full range of motion. Neurovascularly intact.   Neurological: She is alert and oriented to person, place, and time.  Skin: Skin is warm and dry. She is not diaphoretic.  Psychiatric: She has a normal mood and affect. Her behavior is normal. Judgment and thought content normal.  Nursing note and vitals reviewed.  ED Course  Procedures  DIAGNOSTIC STUDIES: Oxygen Saturation is 97% on RA, normal by my interpretation.  COORDINATION OF CARE: 4:22 PM-Will order follow up with ortho doc. Apply ice. Discussed treatment plan with pt at bedside and pt agreed to plan.   Labs Review Labs Reviewed - No data to display  Imaging Review No results found. I have personally reviewed and evaluated these images and lab results as part of my medical decision-making.   EKG Interpretation None      MDM  I have reviewed the relevant previous healthcare records. I obtained HPI from historian.  ED Course:  Assessment: Pt is a 65yF who presents with Left knee pain x 1 month. Seen by ortho on Monday and received Cortisone injection. Here due to continued pain. No recent trauma/fall. On exam, pt in NAD. Nontoxic/nonseptic appearing. VSS. Afebrile.  Left knee with no evidence of cellulitis. Mild swelling. No erythema. Non infectious looking. Full ROM without difficulty. Counseled on ice and elevation. Limited weight bearing at home. Plan is to DC home with follow up to PCP for pain management. At time of discharge, Patient is in no acute distress. Vital Signs are stable. Patient is able to ambulate. Patient able to tolerate PO.    Disposition/Plan:  DC Home Additional Verbal discharge instructions given and discussed with patient.  Pt Instructed to f/u with PCP in the next week for evaluation and treatment of symptoms. Return precautions given Pt acknowledges and agrees with plan  Supervising Physician Leo Grosser, MD   Final diagnoses:  Left knee pain   I personally performed the services described in this documentation, which was scribed in my presence. The recorded information has been reviewed and is accurate.   Shary Decamp, PA-C 07/20/15 1638  Leo Grosser, MD 07/21/15 [phone]

## 2015-07-20 NOTE — Discharge Instructions (Signed)
Please read and follow all provided instructions.  Your diagnoses today include:  1. Left knee pain    Tests performed today include:  Vital signs. See below for your results today.   Medications prescribed:   Take as prescribed   Home care instructions:  Follow any educational materials contained in this packet.  Follow-up instructions: Please follow-up with your primary care provider for further evaluation of symptoms and treatment   Return instructions:   Please return to the Emergency Department if you do not get better, if you get worse, or new symptoms OR  - Fever (temperature greater than 101.48F)  - Bleeding that does not stop with holding pressure to the area    -Severe pain (please note that you may be more sore the day after your accident)  - Chest Pain  - Difficulty breathing  - Severe nausea or vomiting  - Inability to tolerate food and liquids  - Passing out  - Skin becoming red around your wounds  - Change in mental status (confusion or lethargy)  - New numbness or weakness     Please return if you have any other emergent concerns.  Additional Information:  Your vital signs today were: BP 123/66 mmHg   Pulse 75   Temp(Src) 98.6 F (37 C) (Oral)   Resp 18   SpO2 97% If your blood pressure (BP) was elevated above 135/85 this visit, please have this repeated by your doctor within one month. ---------------

## 2015-07-20 NOTE — ED Notes (Signed)
Patient able to ambulate independently  

## 2015-07-20 NOTE — ED Notes (Signed)
Patient here with ongoing left knee for several days. Seen at ortho Monday and they told her arthritis and she received cortisone shot, patient here for ongoing pain

## 2015-08-01 ENCOUNTER — Encounter: Payer: Self-pay | Admitting: Cardiology

## 2015-08-15 ENCOUNTER — Ambulatory Visit (INDEPENDENT_AMBULATORY_CARE_PROVIDER_SITE_OTHER): Payer: Medicare HMO

## 2015-08-15 ENCOUNTER — Telehealth: Payer: Self-pay | Admitting: Cardiology

## 2015-08-15 DIAGNOSIS — Z95810 Presence of automatic (implantable) cardiac defibrillator: Secondary | ICD-10-CM | POA: Diagnosis not present

## 2015-08-15 DIAGNOSIS — I5022 Chronic systolic (congestive) heart failure: Secondary | ICD-10-CM

## 2015-08-15 DIAGNOSIS — I5042 Chronic combined systolic (congestive) and diastolic (congestive) heart failure: Secondary | ICD-10-CM | POA: Diagnosis not present

## 2015-08-15 NOTE — Telephone Encounter (Signed)
LMOVM reminding pt to send remote transmission.   

## 2015-08-16 NOTE — Progress Notes (Signed)
EPIC Encounter for ICM Monitoring  Patient Name: [name] is a 65 y.o. female Date: 08/16/2015 Primary Care Physican: Imelda Pillow, NP Primary Cardiologist: Marlou Porch Electrophysiologist: Allred Dry Weight: unknown       Heart Failure questions reviewed, pt asymptomatic   Thoracic impedance normal.  Recommendations: No changes.  Low sodium diet education provided   ICM trend: 08/15/2015     Follow-up plan: ICM clinic phone appointment on 09/16/2015.  Copy of ICM check sent to device physician.   [name], RN 08/16/2015 8:49 AM

## 2015-08-19 ENCOUNTER — Ambulatory Visit (INDEPENDENT_AMBULATORY_CARE_PROVIDER_SITE_OTHER): Payer: Medicare HMO | Admitting: Cardiology

## 2015-08-19 ENCOUNTER — Encounter (INDEPENDENT_AMBULATORY_CARE_PROVIDER_SITE_OTHER): Payer: Self-pay

## 2015-08-19 VITALS — BP 110/90 | HR 58 | Ht 63.0 in | Wt 253.8 lb

## 2015-08-19 DIAGNOSIS — I5022 Chronic systolic (congestive) heart failure: Secondary | ICD-10-CM

## 2015-08-19 DIAGNOSIS — Z95810 Presence of automatic (implantable) cardiac defibrillator: Secondary | ICD-10-CM

## 2015-08-19 DIAGNOSIS — I10 Essential (primary) hypertension: Secondary | ICD-10-CM | POA: Diagnosis not present

## 2015-08-19 DIAGNOSIS — I255 Ischemic cardiomyopathy: Secondary | ICD-10-CM | POA: Diagnosis not present

## 2015-08-19 DIAGNOSIS — I447 Left bundle-branch block, unspecified: Secondary | ICD-10-CM

## 2015-08-19 MED ORDER — FUROSEMIDE 80 MG PO TABS
80.0000 mg | ORAL_TABLET | Freq: Two times a day (BID) | ORAL | 3 refills | Status: DC
Start: 2015-08-19 — End: 2016-05-17

## 2015-08-19 MED ORDER — ISOSORBIDE MONONITRATE ER 30 MG PO TB24: 30.0000 mg | ORAL_TABLET | Freq: Every day | ORAL | 3 refills | Status: DC

## 2015-08-19 NOTE — Progress Notes (Signed)
[name]. [address], [address] [place], [place]  [address] Phone: [phone] Fax:  [phone]  Date:  08/19/2015   ID:  [name], DOB [birth year], MRN [id number]  PCP:  Imelda Pillow, NP   History of Present Illness: [name] is a 65 y.o. female with cardiomyopathy EF 25-30%, ICD, CABG 2007, cardiac catheterization December of 2011 with widely patent grafts, stress test 2013 large defect prior infarct pattern, EF 31%.  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 She also saw Dr. Roxy Manns, not a candidate for epicardial lead. No change in shortness of breath. No chest pain.  Overall I'm pleased with her progress. She is maintaining her weight. No change in shortness of breath, no chest pain. She is not taking a statin, retry but she developed a rash on her stomach and back. Her LDL has been 40 in the past.  Wt Readings from Last 3 Encounters:  08/19/15 253 lb 12.8 oz (115.1 kg)  05/19/15 256 lb 6.4 oz (116.3 kg)  04/21/15 253 lb 12.8 oz (115.1 kg)     Past Medical History:  Diagnosis Date  . Angina decubitus ([place]) 11/06/2012  . Angina pectoris ([place])   . CHF (congestive heart failure) ([place])   . Chronic combined systolic and diastolic CHF, NYHA class 3 ([place])   . Coronary artery disease    s/p CABG 2007  . Diabetes mellitus    type II  . Hypertension   . Ischemic cardiomyopathy    EF 30-35%, s/p ICD 4/08 by Dr Leonia Reeves  . Morbid obesity ([place]) 11/06/2012  . Obesity   . Oxygen dependent 2 L  . 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 Sleep apnea    uses O2 at night  . Ventricular tachycardia ([place]) 01/16/15   sustained VT terminated with ATP, CL 340 msec    Past Surgical History:  Procedure Laterality Date  . BI-VENTRICULAR IMPLANTABLE CARDIOVERTER DEFIBRILLATOR UPGRADE N/A 09/25/2012   Procedure: BI-VENTRICULAR IMPLANTABLE CARDIOVERTER DEFIBRILLATOR UPGRADE;  Surgeon:  Coralyn Mark, MD;  Location: [facility] CATH LAB;  Service: Cardiovascular;  Laterality: N/A;  . CARDIAC CATHETERIZATION    . CARDIAC DEFIBRILLATOR PLACEMENT  4/08   by Dr Leonia Reeves (MDT)  . CORONARY ARTERY BYPASS GRAFT  10/05/2005   by Dr Cyndia Bent  . IMPLANTABLE CARDIOVERTER DEFIBRILLATOR IMPLANT  09/25/12   attempt of upgrade to CRT-D unsuccessful due to CS anatomy, SJM Unify Asaura device placed with LV port capped by Dr Rayann Heman    Current Outpatient Prescriptions  Medication Sig Dispense Refill  . albuterol (PROVENTIL HFA;VENTOLIN HFA) 108 (90 BASE) MCG/ACT inhaler Inhale 2 puffs into the lungs every 6 (six) hours as needed for wheezing or shortness of breath.    Marland Kitchen aspirin EC 81 MG EC tablet Take 1 tablet (81 mg total) by mouth daily. 30 tablet 12  . azithromycin (ZITHROMAX Z-PAK) 250 MG tablet Take 1 tablet (250 mg total) by mouth daily. 4 tablet 0  . carvedilol (COREG) 12.5 MG tablet Take 12.5 mg by mouth 2 (two) times daily with a meal.     . Choline Fenofibrate (FENOFIBRIC ACID) 135 MG CPDR Take 135 mg by mouth daily.    . fluconazole (DIFLUCAN) 150 MG tablet Take 150 mg by mouth daily.     . furosemide (LASIX) 80 MG tablet Take 1 tablet (80 mg  total) by mouth 2 (two) times daily. 180 tablet 3  . glipiZIDE (GLUCOTROL) 5 MG tablet Take 5 mg by mouth daily before breakfast.     . HYDROcodone-acetaminophen (NORCO) 5-325 MG per tablet Take 1 tablet by mouth every 6 (six) hours as needed for severe pain. 20 tablet 0  . insulin lispro (HUMALOG) 100 UNIT/ML injection Inject 50-100 Units into the skin 3 (three) times daily with meals. Use 100units in am, 50 units for lunch and 50 units in pm    . IRON PO Take 1 tablet by mouth daily.    Marland Kitchen ketoconazole (NIZORAL) 2 % cream Apply 1 application topically 2 (two) times daily.     Marland Kitchen latanoprost (XALATAN) 0.005 % ophthalmic solution Place 1 drop into both eyes at bedtime.    Marland Kitchen lisinopril (PRINIVIL,ZESTRIL) 10 MG tablet Take 10 mg by mouth daily.     .  meloxicam (MOBIC) 7.5 MG tablet Take 7.5 mg by mouth daily.    . methocarbamol (ROBAXIN) 500 MG tablet Take 1 tablet (500 mg total) by mouth every 8 (eight) hours as needed for muscle spasms. 45 tablet 0  . nitroGLYCERIN (NITROSTAT) 0.4 MG SL tablet Place 1 tablet (0.4 mg total) under the tongue every 5 (five) minutes as needed. 25 tablet 3  . potassium phosphate, monobasic, (K-PHOS ORIGINAL) 500 MG tablet Take 500 mg by mouth daily as needed (takes when needed for spasms).     Marland Kitchen spironolactone (ALDACTONE) 25 MG tablet Take 0.5 tablets (12.5 mg total) by mouth daily. 30 tablet 11  . isosorbide mononitrate (IMDUR) 30 MG 24 hr tablet Take 1 tablet (30 mg total) by mouth daily. 90 tablet 3   No current facility-administered medications for this visit.     Allergies:    Allergies  Allergen Reactions  . Metformin And Related Itching, Swelling and Other (See Comments)    Leg pain & swelling in legs  . Atorvastatin Itching and Rash  . Levofloxacin Itching    Social History:  The patient  reports that she has never smoked. She has never used smokeless tobacco. She reports that she does not drink alcohol or use drugs.   ROS:  Please see the history of present illness.   Denies any syncope, bleeding, orthopnea, PND  PHYSICAL EXAM: VS:  BP 110/90   Pulse (!) 58   Ht 5\' 3"  (1.6 m)   Wt 253 lb 12.8 oz (115.1 kg)   BMI 44.96 kg/m  Well nourished, well developed, in no acute distress HEENT: normal Neck: no JVD Cardiac:  normal S1, S2; RRR; no murmur Lungs:  clear to auscultation bilaterally, no wheezing, rhonchi or rales Abd: soft, nontender, no hepatomegalyObese Ext: no edema Skin: warm and dry Neuro: no focal abnormalities noted  EKG: 11/06/12: Left bundle branch block, 68, sinus rhythm, no change from prior 06/10/13-sinus rhythm, 63, left bundle branch block  Cardiac angiogram-2011-widely patent grafts  Nuclear stress test-2013-no ischemia, old infarct, EF 31%  ASSESSMENT AND  PLAN:  1. Chronic systolic heart failure-fairly well compensated. NYHA class 2-3. Has continued shortness of breath, but no significant change. Continue to advocate dietary compliance, low-salt diet, fluid restrictions. Continue with currently 6 dose 2. Left bundle branch block-unable to place biventricular lead due to vein sclerosis. Not a candidate for epicardial lead. ICD functioning well. 3. Not on statin because of "breaking out" rash. Stomach and back.  4. Morbid obesity-encourage weight loss. Low carbohydrate diet.Discussed at length today. 5. Angina-chest pain with exertion has improved  greatly with addition of isosorbide. Prior catheterization reassuring. Nuclear stress test reassuring. 6. Hypertension-currently well controlled check a basic metabolic profile, on spironolactone. Prior creatinine 0.9 Prior potassium 4.0. Furosemide 80 mg twice a day. Stable. Weight has been stable. 7. Diabetes-continue to work hard with your primary physician. 8. We will see her back in four months, [name].   Signed, Candee Furbish, MD [facility]  08/19/2015 2:53 PM

## 2015-08-19 NOTE — Patient Instructions (Signed)
Medication Instructions:  Change Isosorbide from dinitrate to mononitrate Continue all other medications as listed.  Follow-Up: Follow up in 4 months with [name], PA  If you need a refill on your cardiac medications before your next appointment, please call your pharmacy.  Thank you for choosing [facility]!!

## 2015-09-09 DIAGNOSIS — I82409 Acute embolism and thrombosis of unspecified deep veins of unspecified lower extremity: Secondary | ICD-10-CM

## 2015-09-09 HISTORY — DX: Acute embolism and thrombosis of unspecified deep veins of unspecified lower extremity: I82.409

## 2015-09-16 ENCOUNTER — Telehealth: Payer: Self-pay | Admitting: Cardiology

## 2015-09-16 ENCOUNTER — Ambulatory Visit (INDEPENDENT_AMBULATORY_CARE_PROVIDER_SITE_OTHER): Payer: Medicare HMO

## 2015-09-16 DIAGNOSIS — I5022 Chronic systolic (congestive) heart failure: Secondary | ICD-10-CM

## 2015-09-16 DIAGNOSIS — Z95810 Presence of automatic (implantable) cardiac defibrillator: Secondary | ICD-10-CM | POA: Diagnosis not present

## 2015-09-16 NOTE — Progress Notes (Signed)
EPIC Encounter for ICM Monitoring  Patient Name: [name] is a 65 y.o. female Date: 09/16/2015 Primary Care Physican: Imelda Pillow, NP Primary Cardiologist: Marlou Porch Electrophysiologist: Allred Dry Weight: unknown       Heart Failure questions reviewed, pt symptomatic with leg swelling  Thoracic impedance abnormal suggesting fluid accumulation.  LABS: 05/19/2015 Creatinine 1.11, BUN 16, Potassium 4.1, Sodium 138 05/06/2015 Creatinine 1.27, BUN 15, Potassium 4.4, Sodium 137 04/29/2015 Creatinine 1.11, BUN 21, Potassium 4.4, Sodium 128 04/11/2015 Creatinine 1.12, BUN 20, Potassium 4.3, Sodium 136 04/04/2015 Creatinine 1.13, BUN 15, Potassium, 4.3, Sodium 138  Recommendations:  Reviewed with Jamie Standard, PA in office.  Increase Furosemide to 120 mg in am and 80 pm x 3 days and after 3rd day return to 80 mg bid.  BMET on 09/19/2015.    Follow-up plan: ICM clinic phone appointment on 09/22/2015.  Copy of ICM check sent to primary cardiologist and device physician.   ICM trend: 09/16/2015       [name], RN 09/16/2015 11:06 AM

## 2015-09-16 NOTE — Telephone Encounter (Signed)
Spoke with pt and reminded pt of remote transmission that is due today. Pt verbalized understanding.   

## 2015-09-19 ENCOUNTER — Other Ambulatory Visit: Payer: Medicare HMO

## 2015-09-20 ENCOUNTER — Ambulatory Visit
Admission: RE | Admit: 2015-09-20 | Discharge: 2015-09-20 | Disposition: A | Discharge status: Home / Self Care | Payer: Medicare HMO | Source: Ambulatory Visit | Attending: *Deleted | Admitting: *Deleted

## 2015-09-20 ENCOUNTER — Other Ambulatory Visit (INDEPENDENT_AMBULATORY_CARE_PROVIDER_SITE_OTHER): Payer: Medicare HMO

## 2015-09-20 DIAGNOSIS — R921 Mammographic calcification found on diagnostic imaging of breast: Secondary | ICD-10-CM

## 2015-09-20 DIAGNOSIS — I5022 Chronic systolic (congestive) heart failure: Secondary | ICD-10-CM | POA: Diagnosis not present

## 2015-09-20 LAB — BASIC METABOLIC PANEL
BUN: 13 mg/dL (ref 7–25)
CO2: 29 mmol/L (ref 20–31)
Calcium: 9.6 mg/dL (ref 8.6–10.4)
Chloride: 95 mmol/L — ABNORMAL LOW (ref 98–110)
Creat: 0.88 mg/dL (ref 0.50–0.99)
Glucose, Bld: 232 mg/dL — ABNORMAL HIGH (ref 65–99)
Potassium: 3.9 mmol/L (ref 3.5–5.3)
Sodium: 136 mmol/L (ref 135–146)

## 2015-09-22 ENCOUNTER — Ambulatory Visit (INDEPENDENT_AMBULATORY_CARE_PROVIDER_SITE_OTHER): Payer: Medicare HMO

## 2015-09-22 ENCOUNTER — Telehealth: Payer: Self-pay

## 2015-09-22 DIAGNOSIS — Z95810 Presence of automatic (implantable) cardiac defibrillator: Secondary | ICD-10-CM

## 2015-09-22 DIAGNOSIS — I5022 Chronic systolic (congestive) heart failure: Secondary | ICD-10-CM

## 2015-09-22 NOTE — Telephone Encounter (Signed)
Call to patient and provided lab results.  Requested she send ICM remote transmission today to recheck fluid levels.

## 2015-09-22 NOTE — Progress Notes (Signed)
EPIC Encounter for ICM Monitoring  Patient Name: [name] is a 65 y.o. female Date: 09/22/2015 Primary Care Physican: Imelda Pillow, NP Primary Cardiologist: Marlou Porch Electrophysiologist: Allred Dry Weight:  unknown       Heart Failure questions reviewed, pt reported even after taking extra Furosemide x 3 days, patient continues to have swelling in legs from thigh to feet.  She stated she thought it was related to arthritis of the knees has follow up with orthopedic doctors but no change.  She is calling PCP today regarding her legs and she needs assistance in the home.   Thoracic impedance normal since taking increased Furosemide x 3 days on 09/16/2015.  Recommendations: No changes.      Follow-up plan: ICM clinic phone appointment on 10/18/2015.  Copy of ICM check sent to primary cardiologist and device physician.   ICM trend: 09/22/2015       [name], RN 09/22/2015 9:59 AM

## 2015-09-30 ENCOUNTER — Telehealth: Payer: Self-pay | Admitting: Cardiology

## 2015-09-30 ENCOUNTER — Encounter (HOSPITAL_COMMUNITY): Payer: Self-pay | Admitting: *Deleted

## 2015-09-30 ENCOUNTER — Inpatient Hospital Stay (HOSPITAL_COMMUNITY)
Admission: EM | Admit: 2015-09-30 | Discharge: 2015-10-07 | DRG: 252 | Disposition: A | Discharge status: Home Health | Payer: Medicare HMO | Attending: Oncology | Admitting: Oncology

## 2015-09-30 DIAGNOSIS — G4733 Obstructive sleep apnea (adult) (pediatric): Secondary | ICD-10-CM | POA: Diagnosis present

## 2015-09-30 DIAGNOSIS — K5900 Constipation, unspecified: Secondary | ICD-10-CM | POA: Diagnosis not present

## 2015-09-30 DIAGNOSIS — I82403 Acute embolism and thrombosis of unspecified deep veins of lower extremity, bilateral: Secondary | ICD-10-CM

## 2015-09-30 DIAGNOSIS — I82413 Acute embolism and thrombosis of femoral vein, bilateral: Principal | ICD-10-CM | POA: Diagnosis present

## 2015-09-30 DIAGNOSIS — I10 Essential (primary) hypertension: Secondary | ICD-10-CM | POA: Diagnosis present

## 2015-09-30 DIAGNOSIS — Z7984 Long term (current) use of oral hypoglycemic drugs: Secondary | ICD-10-CM

## 2015-09-30 DIAGNOSIS — Z79899 Other long term (current) drug therapy: Secondary | ICD-10-CM

## 2015-09-30 DIAGNOSIS — Z95810 Presence of automatic (implantable) cardiac defibrillator: Secondary | ICD-10-CM

## 2015-09-30 DIAGNOSIS — I5042 Chronic combined systolic (congestive) and diastolic (congestive) heart failure: Secondary | ICD-10-CM | POA: Diagnosis present

## 2015-09-30 DIAGNOSIS — N189 Chronic kidney disease, unspecified: Secondary | ICD-10-CM | POA: Diagnosis present

## 2015-09-30 DIAGNOSIS — I130 Hypertensive heart and chronic kidney disease with heart failure and stage 1 through stage 4 chronic kidney disease, or unspecified chronic kidney disease: Secondary | ICD-10-CM | POA: Diagnosis present

## 2015-09-30 DIAGNOSIS — IMO0001 Reserved for inherently not codable concepts without codable children: Secondary | ICD-10-CM

## 2015-09-30 DIAGNOSIS — Z794 Long term (current) use of insulin: Secondary | ICD-10-CM

## 2015-09-30 DIAGNOSIS — D649 Anemia, unspecified: Secondary | ICD-10-CM | POA: Diagnosis not present

## 2015-09-30 DIAGNOSIS — Z9981 Dependence on supplemental oxygen: Secondary | ICD-10-CM

## 2015-09-30 DIAGNOSIS — M79604 Pain in right leg: Secondary | ICD-10-CM

## 2015-09-30 DIAGNOSIS — I82409 Acute embolism and thrombosis of unspecified deep veins of unspecified lower extremity: Secondary | ICD-10-CM | POA: Diagnosis present

## 2015-09-30 DIAGNOSIS — M79605 Pain in left leg: Secondary | ICD-10-CM | POA: Diagnosis not present

## 2015-09-30 DIAGNOSIS — Z6841 Body Mass Index (BMI) 40.0 and over, adult: Secondary | ICD-10-CM

## 2015-09-30 DIAGNOSIS — E119 Type 2 diabetes mellitus without complications: Secondary | ICD-10-CM

## 2015-09-30 DIAGNOSIS — J9601 Acute respiratory failure with hypoxia: Secondary | ICD-10-CM | POA: Diagnosis not present

## 2015-09-30 DIAGNOSIS — I2510 Atherosclerotic heart disease of native coronary artery without angina pectoris: Secondary | ICD-10-CM | POA: Diagnosis present

## 2015-09-30 DIAGNOSIS — I82443 Acute embolism and thrombosis of tibial vein, bilateral: Secondary | ICD-10-CM | POA: Diagnosis present

## 2015-09-30 DIAGNOSIS — E1122 Type 2 diabetes mellitus with diabetic chronic kidney disease: Secondary | ICD-10-CM | POA: Diagnosis present

## 2015-09-30 DIAGNOSIS — I82433 Acute embolism and thrombosis of popliteal vein, bilateral: Secondary | ICD-10-CM | POA: Diagnosis present

## 2015-09-30 DIAGNOSIS — E1165 Type 2 diabetes mellitus with hyperglycemia: Secondary | ICD-10-CM | POA: Diagnosis present

## 2015-09-30 DIAGNOSIS — I82422 Acute embolism and thrombosis of left iliac vein: Secondary | ICD-10-CM | POA: Diagnosis present

## 2015-09-30 DIAGNOSIS — I82402 Acute embolism and thrombosis of unspecified deep veins of left lower extremity: Secondary | ICD-10-CM

## 2015-09-30 DIAGNOSIS — Z951 Presence of aortocoronary bypass graft: Secondary | ICD-10-CM

## 2015-09-30 DIAGNOSIS — I255 Ischemic cardiomyopathy: Secondary | ICD-10-CM | POA: Diagnosis present

## 2015-09-30 DIAGNOSIS — I447 Left bundle-branch block, unspecified: Secondary | ICD-10-CM | POA: Diagnosis present

## 2015-09-30 DIAGNOSIS — E785 Hyperlipidemia, unspecified: Secondary | ICD-10-CM | POA: Diagnosis present

## 2015-09-30 DIAGNOSIS — Z7982 Long term (current) use of aspirin: Secondary | ICD-10-CM

## 2015-09-30 HISTORY — DX: Presence of automatic (implantable) cardiac defibrillator: Z95.810

## 2015-09-30 HISTORY — DX: Acute embolism and thrombosis of unspecified deep veins of unspecified lower extremity: I82.409

## 2015-09-30 LAB — COMPREHENSIVE METABOLIC PANEL
ALBUMIN: 3.7 g/dL (ref 3.5–5.0)
ALT: 15 U/L (ref 14–54)
AST: 19 U/L (ref 15–41)
Alkaline Phosphatase: 78 U/L (ref 38–126)
Anion gap: 9 (ref 5–15)
BUN: 14 mg/dL (ref 6–20)
CHLORIDE: 103 mmol/L (ref 101–111)
CO2: 26 mmol/L (ref 22–32)
CREATININE: 0.98 mg/dL (ref 0.44–1.00)
Calcium: 10 mg/dL (ref 8.9–10.3)
GFR calc Af Amer: >60 mL/min (ref >60)
GFR, EST NON AFRICAN AMERICAN: 59 mL/min — AB (ref >60)
GLUCOSE: 289 mg/dL — AB (ref 65–99)
Potassium: 4 mmol/L (ref 3.5–5.1)
Sodium: 138 mmol/L (ref 135–145)
Total Bilirubin: 0.6 mg/dL (ref 0.3–1.2)
Total Protein: 8.5 g/dL — ABNORMAL HIGH (ref 6.5–8.1)

## 2015-09-30 LAB — GLUCOSE, CAPILLARY: GLUCOSE-CAPILLARY: 293 mg/dL — AB (ref 65–99)

## 2015-09-30 LAB — CBC
HEMATOCRIT: 39.6 % (ref 36.0–46.0)
Hemoglobin: 12.9 g/dL (ref 12.0–15.0)
MCH: 30.3 pg (ref 26.0–34.0)
MCHC: 32.6 g/dL (ref 30.0–36.0)
MCV: 93 fL (ref 78.0–100.0)
PLATELETS: 221 10*3/uL (ref 150–400)
RBC: 4.26 MIL/uL (ref 3.87–5.11)
RDW: 15.3 % (ref 11.5–15.5)
WBC: 7.7 10*3/uL (ref 4.0–10.5)

## 2015-09-30 LAB — PROTIME-INR
INR: 1.1
Prothrombin Time: 14.2 seconds (ref 11.4–15.2)

## 2015-09-30 MED ORDER — FENOFIBRIC ACID 135 MG PO CPDR: 135.0000 mg | DELAYED_RELEASE_CAPSULE | Freq: Every day | ORAL | Status: DC

## 2015-09-30 MED ORDER — HEPARIN BOLUS VIA INFUSION
5000.0000 [IU] | Freq: Once | INTRAVENOUS | Status: AC
  Administered 2015-09-30: 5000 [IU] via INTRAVENOUS
  Filled 2015-09-30: qty 5000

## 2015-09-30 MED ORDER — LATANOPROST 0.005 % OP SOLN
1.0000 [drp] | Freq: Every day | OPHTHALMIC | Status: DC
  Administered 2015-09-30 – 2015-10-06 (×7): 1 [drp] via OPHTHALMIC
  Filled 2015-09-30 (×2): qty 2.5

## 2015-09-30 MED ORDER — CARVEDILOL 12.5 MG PO TABS
12.5000 mg | ORAL_TABLET | Freq: Two times a day (BID) | ORAL | Status: DC
  Administered 2015-10-01 – 2015-10-07 (×10): 12.5 mg via ORAL
  Filled 2015-09-30 (×14): qty 1

## 2015-09-30 MED ORDER — POTASSIUM CHLORIDE CRYS ER 20 MEQ PO TBCR
20.0000 meq | EXTENDED_RELEASE_TABLET | Freq: Once | ORAL | Status: AC
  Administered 2015-09-30: 20 meq via ORAL
  Filled 2015-09-30: qty 1

## 2015-09-30 MED ORDER — SPIRONOLACTONE 12.5 MG HALF TABLET
12.5000 mg | ORAL_TABLET | Freq: Every day | ORAL | Status: DC
  Administered 2015-09-30 – 2015-10-07 (×8): 12.5 mg via ORAL
  Filled 2015-09-30 (×8): qty 1

## 2015-09-30 MED ORDER — ALBUTEROL SULFATE (2.5 MG/3ML) 0.083% IN NEBU: 2.5000 mg | INHALATION_SOLUTION | Freq: Four times a day (QID) | RESPIRATORY_TRACT | Status: DC | PRN

## 2015-09-30 MED ORDER — LISINOPRIL 10 MG PO TABS
10.0000 mg | ORAL_TABLET | Freq: Every day | ORAL | Status: DC
  Administered 2015-09-30 – 2015-10-07 (×8): 10 mg via ORAL
  Filled 2015-09-30 (×9): qty 1

## 2015-09-30 MED ORDER — FUROSEMIDE 80 MG PO TABS
80.0000 mg | ORAL_TABLET | Freq: Two times a day (BID) | ORAL | Status: DC
Start: 2015-09-30 — End: 2015-10-07
  Administered 2015-09-30 – 2015-10-07 (×14): 80 mg via ORAL
  Filled 2015-09-30 (×16): qty 1

## 2015-09-30 MED ORDER — INSULIN ASPART 100 UNIT/ML ~~LOC~~ SOLN
0.0000 [IU] | Freq: Three times a day (TID) | SUBCUTANEOUS | Status: DC
  Administered 2015-10-01: 2 [IU] via SUBCUTANEOUS
  Administered 2015-10-01 – 2015-10-02 (×3): 3 [IU] via SUBCUTANEOUS
  Administered 2015-10-02 – 2015-10-04 (×5): 2 [IU] via SUBCUTANEOUS
  Administered 2015-10-04 (×2): 3 [IU] via SUBCUTANEOUS

## 2015-09-30 MED ORDER — ASPIRIN EC 81 MG PO TBEC
81.0000 mg | DELAYED_RELEASE_TABLET | Freq: Every day | ORAL | Status: DC
  Administered 2015-09-30 – 2015-10-07 (×7): 81 mg via ORAL
  Filled 2015-09-30 (×7): qty 1

## 2015-09-30 MED ORDER — ISOSORBIDE MONONITRATE ER 30 MG PO TB24
30.0000 mg | ORAL_TABLET | Freq: Every day | ORAL | Status: DC
  Administered 2015-09-30 – 2015-10-07 (×8): 30 mg via ORAL
  Filled 2015-09-30 (×8): qty 1

## 2015-09-30 MED ORDER — ALBUTEROL SULFATE HFA 108 (90 BASE) MCG/ACT IN AERS: 2.0000 | INHALATION_SPRAY | Freq: Four times a day (QID) | RESPIRATORY_TRACT | Status: DC | PRN

## 2015-09-30 MED ORDER — METHOCARBAMOL 500 MG PO TABS
500.0000 mg | ORAL_TABLET | Freq: Three times a day (TID) | ORAL | Status: DC | PRN
  Administered 2015-10-01 – 2015-10-03 (×6): 500 mg via ORAL
  Filled 2015-09-30 (×7): qty 1

## 2015-09-30 MED ORDER — ACETAMINOPHEN 650 MG RE SUPP: 650.0000 mg | Freq: Four times a day (QID) | RECTAL | Status: DC | PRN

## 2015-09-30 MED ORDER — ACETAMINOPHEN 325 MG PO TABS
650.0000 mg | ORAL_TABLET | Freq: Four times a day (QID) | ORAL | Status: DC | PRN
  Administered 2015-10-01 – 2015-10-02 (×6): 650 mg via ORAL
  Filled 2015-09-30 (×6): qty 2

## 2015-09-30 MED ORDER — HEPARIN (PORCINE) IN NACL 100-0.45 UNIT/ML-% IJ SOLN
1300.0000 [IU]/h | INTRAMUSCULAR | Status: DC
  Administered 2015-09-30: 1300 [IU]/h via INTRAVENOUS
  Filled 2015-09-30 (×3): qty 250

## 2015-09-30 MED ORDER — FENOFIBRATE 160 MG PO TABS
160.0000 mg | ORAL_TABLET | Freq: Every day | ORAL | Status: DC
  Administered 2015-10-01 – 2015-10-07 (×6): 160 mg via ORAL
  Filled 2015-09-30 (×7): qty 1

## 2015-09-30 MED ORDER — ASPIRIN 81 MG PO TBEC: 81.0000 mg | DELAYED_RELEASE_TABLET | Freq: Every day | ORAL | Status: DC

## 2015-09-30 MED ORDER — INSULIN ASPART 100 UNIT/ML ~~LOC~~ SOLN
6.0000 [IU] | Freq: Three times a day (TID) | SUBCUTANEOUS | Status: DC
  Administered 2015-10-01: 6 [IU] via SUBCUTANEOUS

## 2015-09-30 MED ORDER — INSULIN ASPART 100 UNIT/ML ~~LOC~~ SOLN
0.0000 [IU] | Freq: Every day | SUBCUTANEOUS | Status: DC
Start: 2015-09-30 — End: 2015-10-05
  Administered 2015-09-30: 3 [IU] via SUBCUTANEOUS
  Administered 2015-10-04: 2 [IU] via SUBCUTANEOUS

## 2015-09-30 NOTE — ED Provider Notes (Signed)
[facility] DEPT Provider Note   CSN: [id number] Arrival date & time: 09/30/15  [id number]     History   Chief Complaint Chief Complaint  Patient presents with  . DVT    HPI Lisvet L Marois is a 65 y.o. female.  HPI Nicol L Osias is a 65 y.o. female with PMH significant for CHF, CAD, ischemic cardiomyopathy, oxygen dependent who presents with gradual onset, constant, worsening left leg pain x 3 months.  Associated symptoms include swelling.  No fever, cough, chest pain, shortness of breath.  She is having difficulty ambulating due to pain.  Nothing makes it better.  PCP scheduled LLE doppler and it did show thrombus in femoral, profunda, popliteal, and greater saphenous veins.   Past Medical History:  Diagnosis Date  . Angina decubitus ([place]) 11/06/2012  . Angina pectoris ([place])   . CHF (congestive heart failure) ([place])   . Chronic combined systolic and diastolic CHF, NYHA class 3 ([place])   . Coronary artery disease    s/p CABG 2007  . Diabetes mellitus    type II  . Hypertension   . Ischemic cardiomyopathy    EF 30-35%, s/p ICD 4/08 by Dr Leonia Reeves  . Morbid obesity ([place]) 11/06/2012  . Obesity   . Oxygen dependent 2 L  . 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 Sleep apnea    uses O2 at night  . Ventricular tachycardia ([place]) 01/16/15   sustained VT terminated with ATP, CL 340 msec    Patient Active Problem List   Diagnosis Date Noted  . DVT (deep venous thrombosis), left 09/30/2015  . Syncope 03/11/2015  . AKI (acute kidney injury) ([place]) 03/11/2015  . Hypotension 03/11/2015  . Syncope and collapse 03/11/2015  . Ventricular tachycardia ([place]) 01/17/2015  . Pain in the chest   . SOB (shortness of breath)   . Thyroid nodule   . CAP (community acquired pneumonia)   . Chest pain 05/26/2014  . Right flank pain 05/26/2014  . Right flank discomfort   . Chronic combined systolic and  diastolic CHF, NYHA class 3 ([place])   . Angina pectoris ([place])   . Morbid obesity ([place]) 11/06/2012  . Angina decubitus ([place]) 11/06/2012  . LBBB (left bundle branch block) 09/24/2012  . Chronic systolic dysfunction of left ventricle 09/24/2012  . Hypoglycemia 09/09/2012  . HLD (hyperlipidemia) 04/23/2012  . Diabetes ([place]) 04/23/2012  . Acute renal failure ([place]) 03/10/2012  . Ischemic cardiomyopathy 06/18/2010  . CAD (coronary artery disease) 06/18/2010  . Hypertension 06/18/2010  . 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    Past Surgical History:  Procedure Laterality Date  . BI-VENTRICULAR IMPLANTABLE CARDIOVERTER DEFIBRILLATOR UPGRADE N/A 09/25/2012   Procedure: BI-VENTRICULAR IMPLANTABLE CARDIOVERTER DEFIBRILLATOR UPGRADE;  Surgeon: Coralyn Mark, MD;  Location: [facility] CATH LAB;  Service: Cardiovascular;  Laterality: N/A;  . CARDIAC CATHETERIZATION    . CARDIAC DEFIBRILLATOR PLACEMENT  4/08   by Dr Leonia Reeves (MDT)  . CORONARY ARTERY BYPASS GRAFT  10/05/2005   by Dr Cyndia Bent  . IMPLANTABLE CARDIOVERTER DEFIBRILLATOR IMPLANT  09/25/12   attempt of upgrade to CRT-D unsuccessful due to CS anatomy, SJM Unify Asaura device placed with LV port capped by Dr Rayann Heman    OB History    No data available       Home Medications    Prior to Admission medications   Medication Sig Start Date  End Date Taking? Authorizing Provider  albuterol (PROVENTIL HFA;VENTOLIN HFA) 108 (90 BASE) MCG/ACT inhaler Inhale 2 puffs into the lungs every 6 (six) hours as needed for wheezing or shortness of breath.   Yes Historical Provider, MD  albuterol (PROVENTIL) (2.5 MG/3ML) 0.083% nebulizer solution Take 2.5 mg by nebulization every 6 (six) hours as needed for wheezing or shortness of breath.  09/30/15  Yes Historical Provider, MD  aspirin EC 81 MG EC tablet Take 1 tablet (81 mg total) by mouth daily. 09/11/12  Yes Jerline Pain, MD  carvedilol (COREG) 12.5 MG tablet Take 12.5 mg by mouth 2 (two) times daily with a meal.  03/30/15  Yes  Historical Provider, MD  Choline Fenofibrate (FENOFIBRIC ACID) 135 MG CPDR Take 135 mg by mouth daily. 02/09/15  Yes Historical Provider, MD  fluconazole (DIFLUCAN) 150 MG tablet Take 150 mg by mouth daily.  05/15/15  Yes Historical Provider, MD  furosemide (LASIX) 80 MG tablet Take 1 tablet (80 mg total) by mouth 2 (two) times daily. 08/19/15  Yes Jerline Pain, MD  glipiZIDE (GLUCOTROL) 5 MG tablet Take 5 mg by mouth daily before breakfast.    Yes Historical Provider, MD  IRON PO Take 1 tablet by mouth daily.   Yes Historical Provider, MD  isosorbide mononitrate (IMDUR) 30 MG 24 hr tablet Take 1 tablet (30 mg total) by mouth daily. 08/19/15 11/17/15 Yes Jerline Pain, MD  ketoconazole (NIZORAL) 2 % cream Apply 1 application topically 2 (two) times daily.  03/30/15  Yes Historical Provider, MD  latanoprost (XALATAN) 0.005 % ophthalmic solution Place 1 drop into both eyes at bedtime.   Yes Historical Provider, MD  lisinopril (PRINIVIL,ZESTRIL) 10 MG tablet Take 10 mg by mouth daily.    Yes Historical Provider, MD  meloxicam (MOBIC) 7.5 MG tablet Take 7.5 mg by mouth daily. 02/10/15  Yes Historical Provider, MD  methocarbamol (ROBAXIN) 500 MG tablet Take 1 tablet (500 mg total) by mouth every 8 (eight) hours as needed for muscle spasms. 05/30/14  Yes Barton Dubois, MD  nitroGLYCERIN (NITROSTAT) 0.4 MG SL tablet Place 1 tablet (0.4 mg total) under the tongue every 5 (five) minutes as needed. Patient taking differently: Place 0.4 mg under the tongue every 5 (five) minutes as needed for chest pain.  05/19/15  Yes Scott T Weaver, PA-C  NOVOLOG FLEXPEN 100 UNIT/ML FlexPen Inject 30-50 Units into the skin See admin instructions. 50 units every morning, 30 units at lunch and 50 units at suppre 09/12/15  Yes Historical Provider, MD  potassium chloride SA (K-DUR,KLOR-CON) 20 MEQ tablet Take 20 mEq by mouth. 09/12/15  Yes Historical Provider, MD  potassium phosphate, monobasic, (K-PHOS ORIGINAL) 500 MG tablet Take 500 mg by  mouth daily as needed (takes when needed for spasms).    Yes Historical Provider, MD  spironolactone (ALDACTONE) 25 MG tablet Take 0.5 tablets (12.5 mg total) by mouth daily. 03/28/15  Yes Liliane Shi, PA-C    Family History Family History  Problem Relation Age of Onset  . Diabetes Mother 73    died - HTN  . Other      No early family hx of CAD    Social History Social History  Substance Use Topics  . Smoking status: Never Smoker  . Smokeless tobacco: Never Used  . Alcohol use No     Allergies   Metformin and related; Atorvastatin; and Levofloxacin   Review of Systems Review of Systems All other systems negative unless otherwise stated  in HPI   Physical Exam Updated Vital Signs BP 146/65   Pulse 71   Temp 98.7 F (37.1 C) (Oral)   Resp 17   Ht 5\' 4"  (1.626 m)   Wt 112 kg   SpO2 100%   BMI 42.40 kg/m   Physical Exam  Constitutional: She is oriented to person, place, and time. She appears well-developed and well-nourished.  Non-toxic appearance. She does not have a sickly appearance. She does not appear ill.  HENT:  Head: Normocephalic and atraumatic.  Mouth/Throat: Oropharynx is clear and moist.  Eyes: Conjunctivae are normal. Pupils are equal, round, and reactive to light.  Neck: Normal range of motion. Neck supple.  Cardiovascular: Normal rate and regular rhythm.   Pulses:      Dorsalis pedis pulses are 2+ on the right side, and 2+ on the left side.  Unilateral left edema with color change.  Pulmonary/Chest: Effort normal and breath sounds normal. No accessory muscle usage or stridor. No respiratory distress. She has no wheezes. She has no rhonchi. She has no rales.  Abdominal: Soft. Bowel sounds are normal. She exhibits no distension. There is no tenderness.  Musculoskeletal: Normal range of motion.  Lymphadenopathy:    She has no cervical adenopathy.  Neurological: She is alert and oriented to person, place, and time.  Speech clear without dysarthria.   Skin: Skin is warm and dry.  Psychiatric: She has a normal mood and affect. Her behavior is normal.    ED Treatments / Results  Labs (all labs ordered are listed, but only abnormal results are displayed) Labs Reviewed  COMPREHENSIVE METABOLIC PANEL - Abnormal; Notable for the following:       Result Value   Glucose, Bld 289 (*)    Total Protein 8.5 (*)    GFR calc non Af Amer 59 (*)    All other components within normal limits  CBC  PROTIME-INR  HEPARIN LEVEL (UNFRACTIONATED)  CBC  BASIC METABOLIC PANEL  HEMOGLOBIN A1C    EKG  EKG Interpretation None       Radiology No results found.  Procedures Procedures (including critical care time)  Medications Ordered in ED Medications  heparin ADULT infusion 100 units/mL (25000 units/269mL sodium chloride 0.45%) (1,300 Units/hr Intravenous Transfusing/Transfer 09/30/15 1942)  albuterol (PROVENTIL) (2.5 MG/3ML) 0.083% nebulizer solution 2.5 mg (not administered)  potassium chloride SA (K-DUR,KLOR-CON) CR tablet 20 mEq (not administered)  furosemide (LASIX) tablet 80 mg (not administered)  isosorbide mononitrate (IMDUR) 24 hr tablet 30 mg (not administered)  carvedilol (COREG) tablet 12.5 mg (not administered)  spironolactone (ALDACTONE) tablet 12.5 mg (not administered)  Fenofibric Acid CPDR 135 mg (not administered)  lisinopril (PRINIVIL,ZESTRIL) tablet 10 mg (not administered)  methocarbamol (ROBAXIN) tablet 500 mg (not administered)  latanoprost (XALATAN) 0.005 % ophthalmic solution 1 drop (not administered)  aspirin EC tablet 81 mg (not administered)  acetaminophen (TYLENOL) tablet 650 mg (not administered)    Or  acetaminophen (TYLENOL) suppository 650 mg (not administered)  heparin bolus via infusion 5,000 Units (5,000 Units Intravenous Bolus from Bag 09/30/15 1939)     Initial Impression / Assessment and Plan / ED Course  I have reviewed the triage vital signs and the nursing notes.  Pertinent labs & imaging  results that were available during my care of the patient were reviewed by me and considered in my medical decision making (see chart for details).  Clinical Course   Patient presents with 3 months of left leg pain and swelling along with difficulty  ambulating. History of CHF, CAD, ischemic cardiomyopathy, and oxygen dependence. Left lower extremity Doppler performed at about earlier today. This was significant for multiple thrombi in the femoral, profundus, popliteal, and greater saphenous veins. On exam, she does have unilateral lower extremity edema with color changes, dusky. Good pulses. She has no chest pain or shortness of breath to suggest PE. Labs without acute abnormalities. Patient started on heparin. Given multiple DVTs and comorbidities, patient will benefit from admission. Appreciate IMTS.  Case has been discussed with and seen by Dr. Laverta Baltimore who agrees with the above plan for admission.   Final Clinical Impressions(s) / ED Diagnoses   Final diagnoses:  DVT (deep venous thrombosis), left    New Prescriptions New Prescriptions   No medications on file     Gloriann Loan, PA-C 09/30/15 1942    Margette Fast, MD 10/02/15 [phone]

## 2015-09-30 NOTE — ED Notes (Signed)
Pt brought in by daughter, sent from Urgent care for multiple confirmed DVTs. Pts daughter reports the doctor at urgent care told her the patient would be admitted and for the nurses here to call Dr. Lavonda Jumbo who is on call for cardiology. This RN called Dr. Lavonda Jumbo and was told patient is not a direct admit and would need to be checked into the ER to be seen by EDP. This process was explained to patients daughter. She was not very happy that patient would not get to go directly upstairs but verbalized willingness to go through the process and stated "my mother will be admitted, not being admitted is not an option."

## 2015-09-30 NOTE — Telephone Encounter (Signed)
Spoke with Maudie Mercury from [facility] who is reporting pt has multiple DVT's in her left leg.  She is asking if the pt should be admitted or not.  Advised she should speak with hospital DOD - Dr Johnsie Cancel.  Maudie Mercury will contact him to discuss.

## 2015-09-30 NOTE — Progress Notes (Signed)
ANTICOAGULATION CONSULT NOTE - Initial Consult  Jamie Consult for heparin  Indication: DVT  Allergies  Allergen Reactions  . Metformin And Related Itching, Swelling and Other (See Comments)    Leg pain & swelling in legs  . Atorvastatin Itching and Rash  . Levofloxacin Itching   Patient Measurements: Height: 5\' 4"  (162.6 cm) Weight: 247 lb (112 kg) IBW/kg (Calculated) : 54.7 Heparin Dosing Weight: 81 kg   Vital Signs: Temp: 98.7 F (37.1 C) (09/22 1641) Temp Source: Oral (09/22 1641) Pulse Rate: 75 (09/22 1641)  Labs:  Recent Labs  09/30/15 1659  HGB 12.9  HCT 39.6  PLT 221  CREATININE 0.98    Estimated Creatinine Clearance: 70.1 mL/min (by C-G formula based on SCr of 0.98 mg/dL).   Medical History: Past Medical History:  Diagnosis Date  . Angina decubitus ([place]) 11/06/2012  . Angina pectoris ([place])   . CHF (congestive heart failure) ([place])   . Chronic combined systolic and diastolic CHF, NYHA class 3 ([place])   . Coronary artery disease    s/p CABG 2007  . Diabetes mellitus    type II  . Hypertension   . Ischemic cardiomyopathy    EF 30-35%, s/p ICD 4/08 by Dr Leonia Reeves  . Morbid obesity ([place]) 11/06/2012  . Obesity   . Oxygen dependent 2 L  . 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 Sleep apnea    uses O2 at night  . Ventricular tachycardia ([place]) 01/16/15   sustained VT terminated with ATP, CL 340 msec   Assessment: 65 yo female admitted from urgent care with reported multiple DVTs. No oral anticoagulation prior to admission. CBC stable and platelets normal. No overt signs and symptoms of bleeding.   Goal of Therapy:  Heparin level 0.3-0.7 units/ml Monitor platelets by anticoagulation protocol: Yes   Plan:  Give 5000 units bolus x 1 Start heparin infusion at 1300 units/hr Check anti-Xa level in 6 hours and daily while on heparin Continue to monitor H&H and  platelets  [name], PharmD [name]  [name] 09/30/15 6:45 PM

## 2015-09-30 NOTE — ED Triage Notes (Signed)
thge pt was ent here from [facility] urgent care for a dvt.  She has an [name] today.  She has had lt leg pain for 3 months.

## 2015-10-01 ENCOUNTER — Encounter (HOSPITAL_COMMUNITY): Payer: Self-pay | Admitting: Internal Medicine

## 2015-10-01 DIAGNOSIS — I110 Hypertensive heart disease with heart failure: Secondary | ICD-10-CM | POA: Diagnosis not present

## 2015-10-01 DIAGNOSIS — M79605 Pain in left leg: Secondary | ICD-10-CM

## 2015-10-01 DIAGNOSIS — I82402 Acute embolism and thrombosis of unspecified deep veins of left lower extremity: Secondary | ICD-10-CM | POA: Diagnosis not present

## 2015-10-01 DIAGNOSIS — Z7982 Long term (current) use of aspirin: Secondary | ICD-10-CM

## 2015-10-01 DIAGNOSIS — Z95810 Presence of automatic (implantable) cardiac defibrillator: Secondary | ICD-10-CM

## 2015-10-01 DIAGNOSIS — Z794 Long term (current) use of insulin: Secondary | ICD-10-CM

## 2015-10-01 DIAGNOSIS — I255 Ischemic cardiomyopathy: Secondary | ICD-10-CM

## 2015-10-01 DIAGNOSIS — Z951 Presence of aortocoronary bypass graft: Secondary | ICD-10-CM

## 2015-10-01 DIAGNOSIS — I2510 Atherosclerotic heart disease of native coronary artery without angina pectoris: Secondary | ICD-10-CM | POA: Diagnosis not present

## 2015-10-01 DIAGNOSIS — I5042 Chronic combined systolic (congestive) and diastolic (congestive) heart failure: Secondary | ICD-10-CM | POA: Diagnosis not present

## 2015-10-01 DIAGNOSIS — Z79899 Other long term (current) drug therapy: Secondary | ICD-10-CM

## 2015-10-01 DIAGNOSIS — E1165 Type 2 diabetes mellitus with hyperglycemia: Secondary | ICD-10-CM

## 2015-10-01 LAB — TROPONIN I: Troponin I: <0.03 ng/mL (ref <0.03)

## 2015-10-01 LAB — CBC
HEMATOCRIT: 34.6 % — AB (ref 36.0–46.0)
HEMOGLOBIN: 11.4 g/dL — AB (ref 12.0–15.0)
MCH: 30.6 pg (ref 26.0–34.0)
MCHC: 32.9 g/dL (ref 30.0–36.0)
MCV: 92.8 fL (ref 78.0–100.0)
Platelets: 208 10*3/uL (ref 150–400)
RBC: 3.73 MIL/uL — ABNORMAL LOW (ref 3.87–5.11)
RDW: 15.3 % (ref 11.5–15.5)
WBC: 7.1 10*3/uL (ref 4.0–10.5)

## 2015-10-01 LAB — BASIC METABOLIC PANEL
ANION GAP: 7 (ref 5–15)
BUN: 13 mg/dL (ref 6–20)
CHLORIDE: 104 mmol/L (ref 101–111)
CO2: 26 mmol/L (ref 22–32)
Calcium: 9.3 mg/dL (ref 8.9–10.3)
Creatinine, Ser: 1.07 mg/dL — ABNORMAL HIGH (ref 0.44–1.00)
GFR calc non Af Amer: 53 mL/min — ABNORMAL LOW (ref >60)
GLUCOSE: 265 mg/dL — AB (ref 65–99)
POTASSIUM: 3.5 mmol/L (ref 3.5–5.1)
Sodium: 137 mmol/L (ref 135–145)

## 2015-10-01 LAB — GLUCOSE, CAPILLARY
GLUCOSE-CAPILLARY: 223 mg/dL — AB (ref 65–99)
GLUCOSE-CAPILLARY: 242 mg/dL — AB (ref 65–99)
GLUCOSE-CAPILLARY: 164 mg/dL — AB (ref 65–99)
Glucose-Capillary: 141 mg/dL — ABNORMAL HIGH (ref 65–99)

## 2015-10-01 LAB — RAPID URINE DRUG SCREEN, HOSP PERFORMED
AMPHETAMINES: NOT DETECTED
BENZODIAZEPINES: NOT DETECTED
Barbiturates: NOT DETECTED
COCAINE: NOT DETECTED
OPIATES: NOT DETECTED
Tetrahydrocannabinol: NOT DETECTED

## 2015-10-01 LAB — HEPARIN LEVEL (UNFRACTIONATED)
HEPARIN UNFRACTIONATED: 0.51 [IU]/mL (ref 0.30–0.70)
HEPARIN UNFRACTIONATED: 0.48 [IU]/mL (ref 0.30–0.70)

## 2015-10-01 LAB — HIV ANTIBODY (ROUTINE TESTING W REFLEX): HIV SCREEN 4TH GENERATION: NONREACTIVE

## 2015-10-01 MED ORDER — RIVAROXABAN 15 MG PO TABS
15.0000 mg | ORAL_TABLET | Freq: Two times a day (BID) | ORAL | Status: DC
  Administered 2015-10-01 – 2015-10-02 (×3): 15 mg via ORAL
  Filled 2015-10-01 (×3): qty 1

## 2015-10-01 MED ORDER — INSULIN ASPART 100 UNIT/ML ~~LOC~~ SOLN
10.0000 [IU] | Freq: Three times a day (TID) | SUBCUTANEOUS | Status: DC
  Administered 2015-10-01 – 2015-10-02 (×4): 10 [IU] via SUBCUTANEOUS

## 2015-10-01 MED ORDER — OXYCODONE HCL 5 MG PO TABS
5.0000 mg | ORAL_TABLET | Freq: Once | ORAL | Status: AC
  Administered 2015-10-01: 5 mg via ORAL
  Filled 2015-10-01: qty 1

## 2015-10-01 MED ORDER — RIVAROXABAN (XARELTO) EDUCATION KIT FOR DVT/PE PATIENTS
PACK | Freq: Once | Status: AC
  Administered 2015-10-01: 13:00:00
  Filled 2015-10-01: qty 1

## 2015-10-01 MED ORDER — INSULIN GLARGINE 100 UNIT/ML ~~LOC~~ SOLN
20.0000 [IU] | Freq: Every day | SUBCUTANEOUS | Status: DC
  Administered 2015-10-01 – 2015-10-02 (×2): 20 [IU] via SUBCUTANEOUS
  Filled 2015-10-01 (×3): qty 0.2

## 2015-10-01 MED ORDER — OXYCODONE HCL 5 MG PO TABS
5.0000 mg | ORAL_TABLET | Freq: Four times a day (QID) | ORAL | Status: DC | PRN
  Administered 2015-10-01 – 2015-10-04 (×10): 5 mg via ORAL
  Filled 2015-10-01 (×10): qty 1

## 2015-10-01 MED ORDER — INSULIN GLARGINE 100 UNIT/ML ~~LOC~~ SOLN
10.0000 [IU] | Freq: Every day | SUBCUTANEOUS | Status: DC
  Filled 2015-10-01: qty 0.1

## 2015-10-01 MED ORDER — ALUM & MAG HYDROXIDE-SIMETH 200-200-20 MG/5ML PO SUSP
30.0000 mL | ORAL | Status: DC | PRN
  Administered 2015-10-01: 30 mL via ORAL
  Filled 2015-10-01 (×2): qty 30

## 2015-10-01 NOTE — Progress Notes (Signed)
   Subjective: Her left leg continues to be extremely painful, though she is hesitant to express her discomfort.  No change is baseline dyspnea, no chest pain.  Objective:  Vital signs in last 24 hours: Vitals:   09/30/15 1930 09/30/15 2016 09/30/15 2142 10/01/15 0504  BP: 146/65 (!) 150/66 (!) 156/64 (!) 165/84  Pulse: 71 74 79 84  Resp:  16 17 16   Temp:  98.7 F (37.1 C) 98.5 F (36.9 C) 98.1 F (36.7 C)  TempSrc:  Oral Oral Oral  SpO2: 100% 99% 97% 97%  Weight:  245 lb 6.4 oz (111.3 kg)    Height:  5\' 4"  (1.626 m)     Physical Exam  Constitutional: She is oriented to person, place, and time. She appears well-developed and well-nourished. No distress.  Cardiovascular: Normal rate and regular rhythm.   Pulmonary/Chest: Effort normal and breath sounds normal.  Musculoskeletal:  Grossly edematous L leg with 2+ pitting edema to above the knee.  Tender to palpation R leg with trace edema  Neurological: She is alert and oriented to person, place, and time.  Skin: Skin is warm and dry.  Hyperpigmentation of LLE  Psychiatric: She has a normal mood and affect. Her behavior is normal.   CBC Latest Ref Rng & Units 10/01/2015 09/30/2015 05/06/2015  WBC 4.0 - 10.5 K/uL 7.1 7.7 11.9(H)  Hemoglobin 12.0 - 15.0 g/dL 11.4(L) 12.9 13.2  Hematocrit 36.0 - 46.0 % 34.6(L) 39.6 39.9  Platelets 150 - 400 K/uL 208 221 192   BMP Latest Ref Rng & Units 10/01/2015 09/30/2015 09/20/2015  Glucose 65 - 99 mg/dL 265(H) 289(H) 232(H)  BUN 6 - 20 mg/dL 13 14 13   Creatinine 0.44 - 1.00 mg/dL 1.07(H) 0.98 0.88  Sodium 135 - 145 mmol/L 137 138 136  Potassium 3.5 - 5.1 mmol/L 3.5 4.0 3.9  Chloride 101 - 111 mmol/L 104 103 95(L)  CO2 22 - 32 mmol/L 26 26 29   Calcium 8.9 - 10.3 mg/dL 9.3 10.0 9.6     Assessment/Plan:  Principal Problem:   DVT (deep venous thrombosis), left Active Problems:   Ischemic cardiomyopathy   CAD (coronary artery disease)   Hypertension   HLD (hyperlipidemia)   Diabetes  (HCC)   S/P CABG x 5   Chronic combined systolic and diastolic CHF, NYHA class 3 (HCC)  #DVT Proximal DVT of left leg with pain that significantly affects ambulation.  Likely unprovoked, chronic with clot extension over past several months.  Started on therapeutic heparin.  No change in dyspnea or O2 requirement to suggest PE. -Start rivaroxaban -D/c heparin -VIR consult, will likely see her in clinic  #DM Taking glipizide 5mg  daily and Novolog 50U/30U/50U with meals at home.  CBGs 200s-300s here.  Would benefit from switching to basal/bolus insulin regimen.  Will start lantus/novolog and assess her insulin requirements. -Lantus 20U QHS -Novolog 10U TID with SS correction -Diabetes education for pen insulin  #CHF #HTN Stable, euvolemic. -Continue home carvedilol, lasix, imdur, spironolactone, lisinopril  #Deconditioning Has been sedentary due to pain and swelling.  May need rehab. -PT/OT  Dispo: Anticipated discharge in approximately 2 day(s).   Minus Liberty, MD 10/01/2015, 11:55 AM Pager: [phone]

## 2015-10-01 NOTE — Progress Notes (Signed)
ANTICOAGULATION CONSULT NOTE - Follow-up Consult  Pharmacy Consult for heparin  Indication: DVT  Allergies  Allergen Reactions  . Metformin And Related Itching, Swelling and Other (See Comments)    Leg pain & swelling in legs  . Atorvastatin Itching and Rash  . Levofloxacin Itching   Patient Measurements: Height: 5\' 4"  (162.6 cm) Weight: 245 lb 6.4 oz (111.3 kg) IBW/kg (Calculated) : 54.7 Heparin Dosing Weight: 81 kg   Vital Signs: Temp: 98.5 F (36.9 C) (09/22 2142) Temp Source: Oral (09/22 2142) BP: 156/64 (09/22 2142) Pulse Rate: 79 (09/22 2142)  Labs:  Recent Labs  09/30/15 1659 09/30/15 1912 10/01/15 0100  HGB 12.9  --  11.4*  HCT 39.6  --  34.6*  PLT 221  --  208  LABPROT  --  14.2  --   INR  --  1.10  --   HEPARINUNFRC  --   --  0.51  CREATININE 0.98  --  1.07*    Estimated Creatinine Clearance: 64 mL/min (by C-G formula based on SCr of 1.07 mg/dL (H)).   Assessment: 65 yo female on heparin for multiple DVTs. Heparin level 0.51 (therapeutic) on 1300 units/hr. Hgb down a bit. No bleeding noted.  Goal of Therapy:  Heparin level 0.3-0.7 units/ml Monitor platelets by anticoagulation protocol: Yes   Plan:  Continue heparin at 1300 units/hr Will f/u 6hr confirmatory heparin level F/u plans for oral [facility]  Sherlon Handing, PharmD, BCPS Clinical pharmacist, pager [phone] 10/01/15 1:56 AM

## 2015-10-01 NOTE — Progress Notes (Signed)
   10/01/15 1834  Vitals  Temp 99 F (37.2 C)  Temp Source Oral  BP (!) 102/43  BP Location Left Arm  BP Method Automatic  Patient Position (if appropriate) Lying  Pulse Rate (!) 58  Pulse Rate Source Dinamap  Resp 20  Oxygen Therapy  SpO2 98 %  O2 Device Nasal Cannula  O2 Flow Rate (L/min) 2 L/min  pt c/o centralized chest discomfort scoring pain 6/10 with no reported difficulty or shortness of breath. MD paged and waiting for response

## 2015-10-01 NOTE — Consult Note (Signed)
Chief Complaint: Patient was seen in consultation today for possible left lower extremity venography with endovascular intervention/thrombolytic therapy Chief Complaint  Patient presents with  . DVT    Referring Physician(s): Jenetta Downer 'Sullivan,M  Supervising Physician: Arne Cleveland  Patient Status: Inpatient  History of Present Illness: [name] is a 65 y.o. female with history of obesity, CHF/ICM on home oxygen therapy, coronary artery disease with prior CABG/ICD, diabetes, hypertension, obstructive sleep apnea, hyperlipidemia who was recently admitted to the hospital with three-month history of left leg pain and swelling. Initially patient felt symptoms were related to arthritis. She was seen by her primary care physician yesterday and subsequently ordered a left lower extremity venous Doppler study which revealed thrombus in the femoral, profunda, popliteal and greater saphenous veins. She was then referred to the hospital for admission. She has no prior history of blood clots, malignancy, or recent surgery. She has a very sedentary lifestyle secondary to her cardiac status. She is currently on IV heparin therapy and plans are for transition to xarelto. Request now received for consideration of left lower extremity venography with possible endovascular/lytic therapy.  Past Medical History:  Diagnosis Date  . Angina decubitus ([place]) 11/06/2012  . Angina pectoris ([place])   . CHF (congestive heart failure) ([place])   . Chronic combined systolic and diastolic CHF, NYHA class 3 ([place])   . Coronary artery disease    s/p CABG 2007  . Diabetes mellitus    type II  . Hypertension   . Ischemic cardiomyopathy    EF 30-35%, s/p ICD 4/08 by Dr Leonia Reeves  . Morbid obesity ([place]) 11/06/2012  . Obesity   . Oxygen dependent 2 L  . 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 Sleep apnea    uses O2 at night  .  Ventricular tachycardia ([place]) 01/16/15   sustained VT terminated with ATP, CL 340 msec    Past Surgical History:  Procedure Laterality Date  . BI-VENTRICULAR IMPLANTABLE CARDIOVERTER DEFIBRILLATOR UPGRADE N/A 09/25/2012   Procedure: BI-VENTRICULAR IMPLANTABLE CARDIOVERTER DEFIBRILLATOR UPGRADE;  Surgeon: Coralyn Mark, MD;  Location: [facility] CATH LAB;  Service: Cardiovascular;  Laterality: N/A;  . CARDIAC CATHETERIZATION    . CARDIAC DEFIBRILLATOR PLACEMENT  4/08   by Dr Leonia Reeves (MDT)  . CORONARY ARTERY BYPASS GRAFT  10/05/2005   by Dr Cyndia Bent  . IMPLANTABLE CARDIOVERTER DEFIBRILLATOR IMPLANT  09/25/12   attempt of upgrade to CRT-D unsuccessful due to CS anatomy, SJM Unify [place]aura device placed with LV port capped by Dr Rayann Heman    Allergies: Metformin and related; Atorvastatin; and Levofloxacin  Medications: Prior to Admission medications   Medication Sig Start Date End Date Taking? Authorizing Provider  albuterol (PROVENTIL HFA;VENTOLIN HFA) 108 (90 BASE) MCG/ACT inhaler Inhale 2 puffs into the lungs every 6 (six) hours as needed for wheezing or shortness of breath.   Yes Historical Provider, MD  albuterol (PROVENTIL) (2.5 MG/3ML) 0.083% nebulizer solution Take 2.5 mg by nebulization every 6 (six) hours as needed for wheezing or shortness of breath.  09/30/15  Yes Historical Provider, MD  aspirin EC 81 MG EC tablet Take 1 tablet (81 mg total) by mouth daily. 09/11/12  Yes Jerline Pain, MD  carvedilol (COREG) 12.5 MG tablet Take 12.5 mg by mouth 2 (two) times daily with a meal.  03/30/15  Yes Historical Provider, MD  Choline Fenofibrate (FENOFIBRIC ACID)  135 MG CPDR Take 135 mg by mouth daily. 02/09/15  Yes Historical Provider, MD  fluconazole (DIFLUCAN) 150 MG tablet Take 150 mg by mouth daily.  05/15/15  Yes Historical Provider, MD  furosemide (LASIX) 80 MG tablet Take 1 tablet (80 mg total) by mouth 2 (two) times daily. 08/19/15  Yes Jerline Pain, MD  glipiZIDE (GLUCOTROL) 5 MG tablet Take 5 mg by  mouth daily before breakfast.    Yes Historical Provider, MD  IRON PO Take 1 tablet by mouth daily.   Yes Historical Provider, MD  isosorbide mononitrate (IMDUR) 30 MG 24 hr tablet Take 1 tablet (30 mg total) by mouth daily. 08/19/15 11/17/15 Yes Jerline Pain, MD  ketoconazole (NIZORAL) 2 % cream Apply 1 application topically 2 (two) times daily.  03/30/15  Yes Historical Provider, MD  latanoprost (XALATAN) 0.005 % ophthalmic solution Place 1 drop into both eyes at bedtime.   Yes Historical Provider, MD  lisinopril (PRINIVIL,ZESTRIL) 10 MG tablet Take 10 mg by mouth daily.    Yes Historical Provider, MD  meloxicam (MOBIC) 7.5 MG tablet Take 7.5 mg by mouth daily. 02/10/15  Yes Historical Provider, MD  methocarbamol (ROBAXIN) 500 MG tablet Take 1 tablet (500 mg total) by mouth every 8 (eight) hours as needed for muscle spasms. 05/30/14  Yes Barton Dubois, MD  nitroGLYCERIN (NITROSTAT) 0.4 MG SL tablet Place 1 tablet (0.4 mg total) under the tongue every 5 (five) minutes as needed. Patient taking differently: Place 0.4 mg under the tongue every 5 (five) minutes as needed for chest pain.  05/19/15  Yes Scott T Weaver, PA-C  NOVOLOG FLEXPEN 100 UNIT/ML FlexPen Inject 30-50 Units into the skin See admin instructions. 50 units every morning, 30 units at lunch and 50 units at suppre 09/12/15  Yes Historical Provider, MD  potassium chloride SA (K-DUR,KLOR-CON) 20 MEQ tablet Take 20 mEq by mouth. 09/12/15  Yes Historical Provider, MD  potassium phosphate, monobasic, (K-PHOS ORIGINAL) 500 MG tablet Take 500 mg by mouth daily as needed (takes when needed for spasms).    Yes Historical Provider, MD  spironolactone (ALDACTONE) 25 MG tablet Take 0.5 tablets (12.5 mg total) by mouth daily. 03/28/15  Yes Liliane Shi, PA-C     Family History  Problem Relation Age of Onset  . Diabetes Mother 81    died - HTN  . Other      No early family hx of CAD    Social History   Social History  . Marital status: Married     Spouse name: N/A  . Number of children: 3  . Years of education: N/A   Occupational History  . HAIRDRESSER Unemployed   Social History Main Topics  . Smoking status: Never Smoker  . Smokeless tobacco: Never Used  . Alcohol use No  . Drug use: No  . Sexual activity: Not Currently   Other Topics Concern  . None   Social History Narrative   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      Review of Systems A she currently denies headache, fever, chest pain, abdominal/back pain, nausea, vomiting or abnormal bleeding. She does have some dyspnea with exertion and above-mentioned left lower extremity pain and swelling but no paresthesias.  Vital Signs: BP (!) 165/84 (BP Location: Right Arm)   Pulse 84   Temp 98.1 F (36.7 C) (Oral)   Resp 16   Ht 5\' 4"  (1.626 m)   Wt 245 lb 6.4 oz (111.3  kg)   SpO2 97%   BMI 42.12 kg/m   Physical Exam patient awake, alert. Chest clear to auscultation bilaterally anteriorly. Left chest wall ICD; abdomen obese, soft, positive bowel sounds, nontender. Right lower extremity with trace edema; extensive 2-3+ edema of left lower extremity from thigh to foot with some mild hyperpigmentation; no skin breakdown ;intact sensory and motor function.  Mallampati Score:     Imaging: Mm Diag Breast Tomo Uni Right  Result Date: 09/20/2015 CLINICAL DATA:  Six month followup of probably benign calcifications outer right breast. EXAM: 2D DIGITAL DIAGNOSTIC UNILATERAL RIGHT MAMMOGRAM WITH CAD AND ADJUNCT TOMO COMPARISON:  04/06/2015 and 03/29/2015 ACR Breast Density Category a: The breast tissue is almost entirely fatty. FINDINGS: A group of coarse calcifications in the outer third of the right breast posteriorly are stable. They span approximately 6 mm x 2 mm x 2 mm. No new or suspicious microcalcifications are identified in the right breast. There is no mass or distortion. Atherosclerotic vascular calcifications are noted. Mammographic  images were processed with CAD. IMPRESSION: Stable probably benign calcifications in the outer right breast. RECOMMENDATION: Bilateral diagnostic mammogram is recommended in March 2018. I have discussed the findings and recommendations with the patient. Results were also provided in writing at the conclusion of the visit. If applicable, a reminder letter will be sent to the patient regarding the next appointment. BI-RADS CATEGORY  3: Probably benign. Electronically Signed   By: Curlene Dolphin M.D.   On: 09/20/2015 11:15    Labs:  CBC:  Recent Labs  03/13/15 0412 05/06/15 1843 09/30/15 1659 10/01/15 0100  WBC 5.7 11.9* 7.7 7.1  HGB 12.8 13.2 12.9 11.4*  HCT 39.1 39.9 39.6 34.6*  PLT 173 192 221 208    COAGS:  Recent Labs  09/30/15 1912  INR 1.10    BMP:  Recent Labs  03/13/15 0412  05/06/15 1843 05/19/15 1503 09/20/15 1153 09/30/15 1659 10/01/15 0100  NA 138  < > 137 138 136 138 137  K 4.9  < > 4.4 4.1 3.9 4.0 3.5  CL 100*  < > 102 101 95* 103 104  CO2 28  < > 22 25 29 26 26   GLUCOSE 261*  < > 324* 291* 232* 289* 265*  BUN 24*  < > 15 16 13 14 13   CALCIUM 9.8  < > 10.0 9.2 9.6 10.0 9.3  CREATININE 1.44*  < > 1.27* 1.11* 0.88 0.98 1.07*  GFRNONAA 37*  --  43*  --   --  59* 53*  GFRAA 43*  --  50*  --   --  >60 >60  < > = values in this interval not displayed.  LIVER FUNCTION TESTS:  Recent Labs  05/06/15 1843 09/30/15 1659  BILITOT 0.7 0.6  AST 25 19  ALT 24 15  ALKPHOS 55 78  PROT 7.7 8.5*  ALBUMIN 3.7 3.7    TUMOR MARKERS: No results for input(s): AFPTM, CEA, CA199, CHROMGRNA in the last 8760 hours.  Assessment and Plan: 65 y.o. female with history of obesity, CHF/ICM on home oxygen therapy, coronary artery disease with prior CABG/ICD, diabetes, hypertension, obstructive sleep apnea, hyperlipidemia who was recently admitted to the hospital with three-month history of left leg pain and swelling. Initially patient felt symptoms were related to  arthritis. She was seen by her primary care physician yesterday and subsequently ordered a left lower extremity venous Doppler which revealed thrombus in the femoral, profunda, popliteal and greater saphenous veins. She was then  referred to the hospital for admission. Patient has no prior history of blood clots, malignancy, or recent surgery. She has a very sedentary lifestyle secondary to her cardiac status. She is currently on IV heparin therapy and plans are for transition to xarelto. Request now received for consideration of left lower extremity venography with possible endovascular/thrombolytic therapy. Case and prior Doppler report have been reviewed by Dr. Vernard Gambles. Details/risks of venous thrombolytic therapy, including but not limited to, internal bleeding, infection, contrast nephropathy, inability to completely lyse clot burden and need for ICU monitoring were discussed with patient and family. Options for initiation of lytic therapy while in-house versus referral to IR clinic for further evaluation were offered to patient. With apparent chronicity of clot success rate for complete lysis of clot burden would be much less than with acute change. Patient will discuss issue with family further and let us know of her decision. In the interim follow-up lower extremity venous Doppler study would be advised while in house.    Thank you for this interesting consult.  I greatly enjoyed meeting Lorree L Gaultney and look forward to participating in their care.  A copy of this report was sent to the requesting provider on this date.  Electronically Signed: D. Rowe Robert 10/01/2015, 1:48 PM   I spent a total of 40 minutes    in face to face in clinical consultation, greater than 50% of which was counseling/coordinating care for possible left lower extremity venography with endovascular/thrombolytic therapy

## 2015-10-01 NOTE — Plan of Care (Signed)
Problem: Education: Goal: Knowledge of [facility] Education information/materials will improve Outcome: Progressing Commenced xarelto education with pt, education kit given to pt

## 2015-10-01 NOTE — H&P (Signed)
Date: 10/01/2015               Patient Name:  [name] MRN: [id number]  DOB: [birth year] Age / Sex: 66 y.o., female   PCP: Everardo Beals, NP         Medical Service: Internal Medicine Teaching Service         Attending Physician: Dr. Carlyle Basques, MD    First Contact: Dr. Inda Castle Pager: [phone]  Second Contact: Dr. Quay Burow Pager: [phone]       After Hours (After 5p/  First Contact Pager: [phone]  weekends / holidays): Second Contact Pager: [phone]   Chief Complaint: Leg pain and swelling  History of Present Illness: [name] is a 65yo female with past medical history of CHF related to ischemic cardiomyopathy on intermittent O2 of 2L at home, CAD s/p CABGx5 and ICD placement, T2DM, HTN, OSA, and HLD. She has been having increasing left leg pain and swelling for 3 months, which was initially thought to be due to arthritis in knee and then CHF. She was re-evaluated on day of admission by her PCP who obtained a venous doppler of LLE which showed thrombus in the femoral, profunda, popliteal and greater saphenous veins. She was then told to go to the ED for further evaluation and management. Patient has no personal or family history of blood clots. She states that she has been pretty limited in her activity due to shortness of breath and more recently limited by pain so her lifestyle is sedentary. She remains at home mostly due to embarrassment of wearing oxygen in public. Her O2 requirement at home include about 3 hours during the day and 4-5 hours during the night as she sleeps in her recliner. She endorses orthopnea, shortness of breath on exertion as discussed, but no shortness of breath at rest. She denies chest pain, palpitations, headaches, vision changes, weakness, paraesthesias, abdominal pain, N/V/D/C, or urinary symptoms. She denies hematuria, hematochezia, or melena.   In the ED, labs were unremarkable except for hyperglycemia at 289, INR 1.10, PT 14.2 and her VS were  stable. She was placed on IV heparin.  Meds:  Current Meds  Medication Sig  . albuterol (PROVENTIL HFA;VENTOLIN HFA) 108 (90 BASE) MCG/ACT inhaler Inhale 2 puffs into the lungs every 6 (six) hours as needed for wheezing or shortness of breath.  Marland Kitchen albuterol (PROVENTIL) (2.5 MG/3ML) 0.083% nebulizer solution Take 2.5 mg by nebulization every 6 (six) hours as needed for wheezing or shortness of breath.   Marland Kitchen aspirin EC 81 MG EC tablet Take 1 tablet (81 mg total) by mouth daily.  . carvedilol (COREG) 12.5 MG tablet Take 12.5 mg by mouth 2 (two) times daily with a meal.   . Choline Fenofibrate (FENOFIBRIC ACID) 135 MG CPDR Take 135 mg by mouth daily.  . fluconazole (DIFLUCAN) 150 MG tablet Take 150 mg by mouth daily.   . furosemide (LASIX) 80 MG tablet Take 1 tablet (80 mg total) by mouth 2 (two) times daily.  Marland Kitchen glipiZIDE (GLUCOTROL) 5 MG tablet Take 5 mg by mouth daily before breakfast.   . IRON PO Take 1 tablet by mouth daily.  . isosorbide mononitrate (IMDUR) 30 MG 24 hr tablet Take 1 tablet (30 mg total) by mouth daily.  Marland Kitchen ketoconazole (NIZORAL) 2 % cream Apply 1 application topically 2 (two) times daily.   Marland Kitchen latanoprost (XALATAN) 0.005 % ophthalmic solution Place 1 drop into both eyes at bedtime.  Marland Kitchen lisinopril (PRINIVIL,ZESTRIL) 10 MG  tablet Take 10 mg by mouth daily.   . meloxicam (MOBIC) 7.5 MG tablet Take 7.5 mg by mouth daily.  . methocarbamol (ROBAXIN) 500 MG tablet Take 1 tablet (500 mg total) by mouth every 8 (eight) hours as needed for muscle spasms.  . nitroGLYCERIN (NITROSTAT) 0.4 MG SL tablet Place 1 tablet (0.4 mg total) under the tongue every 5 (five) minutes as needed. (Patient taking differently: Place 0.4 mg under the tongue every 5 (five) minutes as needed for chest pain. )  . NOVOLOG FLEXPEN 100 UNIT/ML FlexPen Inject 30-50 Units into the skin See admin instructions. 50 units every morning, 30 units at lunch and 50 units at suppre  . potassium chloride SA (K-DUR,KLOR-CON) 20 MEQ  tablet Take 20 mEq by mouth.  . potassium phosphate, monobasic, (K-PHOS ORIGINAL) 500 MG tablet Take 500 mg by mouth daily as needed (takes when needed for spasms).   Marland Kitchen spironolactone (ALDACTONE) 25 MG tablet Take 0.5 tablets (12.5 mg total) by mouth daily.     Allergies: Allergies as of 09/30/2015 - Review Complete 09/30/2015  Allergen Reaction Noted  . Metformin and related Itching, Swelling, and Other (See Comments) 12/27/2011  . Atorvastatin Itching and Rash 03/11/2015  . Levofloxacin Itching 05/26/2014   Past Medical History:  Diagnosis Date  . Angina decubitus ([place]) 11/06/2012  . Angina pectoris ([place])   . CHF (congestive heart failure) ([place])   . Chronic combined systolic and diastolic CHF, NYHA class 3 ([place])   . Coronary artery disease    s/p CABG 2007  . Diabetes mellitus    type II  . Hypertension   . Ischemic cardiomyopathy    EF 30-35%, s/p ICD 4/08 by Dr Leonia Reeves  . Morbid obesity ([place]) 11/06/2012  . Obesity   . Oxygen dependent 2 L  . 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 Sleep apnea    uses O2 at night  . Ventricular tachycardia ([place]) 01/16/15   sustained VT terminated with ATP, CL 340 msec    Family History: Mother with DM and HTN  Social History: Patient denies current or past tobacco, alcohol or drug uses  Review of Systems: A complete ROS was negative except as per HPI.   Physical Exam: Blood pressure (!) 156/64, pulse 79, temperature 98.5 F (36.9 C), temperature source Oral, resp. rate 17, height 5\' 4"  (1.626 m), weight 111.3 kg (245 lb 6.4 oz), SpO2 97 %.  Constitutional: NAD, sitting up in bed comfortably, eating dinner CV: distant heart sounds, RRR, mild systolic ejection murmur, no rubs or gallops appreciated Resp: CTAB, no increased work of breathing, no wheezing or crackles appreciated Abd: obese, +BS, nondistended, nontender Ext: 2+ pulses in all 4  extremities; LLE swollen in entirety with increased warmth compared to RLE. Tender to palpation in medial-posterior L thigh.  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 Her activity level is limited by dyspnea on exertion. Her risk factors include obesity, CHF, diabetes, and prolonged sitting. There is no family history of blood clots so evaluation into inherited thrombotic disorders is likely not warranted at this point.  --IV heparin --plan to transition to oral anticoagulant with discussion with patient on desired and affordable options  CHF: Patient with history of ischemic cardiomyopathy, last ECHO in 03/2015 showing EF 40-45%. She is s/p CABGx5 and ICD placement. She is managed medically with spironolactone 12.5 daily, lisinopril 10mg  daily, Imdur 30mg  daily, and lasix 80mg  BID. On exam, she appears euvolemic. --Continue home regimen --monitor need for adjusting regimen --Provide O2 supplementation as needed for patient comfort and maintaining O2 sats --daily weights  CAD s/p CABGx5: Patient supposed to be on atorvastatin but stopped taking after lipid panel this year was wnl. She is also on asa 81mg  daily, and nitro sublingual 0.4mg  PRN. --Continue atorvastatin --Stop ASA 81mg   HTN: Patient on medication regimen of spironolactone 12.5 daily, lisinopril 10mg  daily, and Imdur 30mg  daily.  --continue current regimen --monitor need for adjusting regimen  Type 2 DM: Patient with history of  Type 2 DM managed with glipizide 5mg  daily and Novolog 50U with breakfast, 30U with lunch, and 50U with dinner. She states her sugars are usually around 200-250.  --hold glipizide --Novolog 6U with meals; SSI with meals and qhs --Check HgbA1C  Dispo: Admit patient to Observation with expected length of stay less than 2 midnights.  Signed: Alphonzo Grieve, MD 10/01/2015, 1:02 AM  Pager: [phone]

## 2015-10-01 NOTE — Discharge Instructions (Addendum)
You were admitted to the hospital for a blood clot in your leg (deep vein thrombosis), which was causing your leg to be swollen and painful.  You were treated with blood thinners and also a treatment through the veins to dissolve the clot.  You will need to continue on blood thinners for at least the next 6 months to prevent another clot from forming in your legs.  You will need to given yourself injections of Lovenox/Enoxaparin, which is a blood thinner, twice daily for the next 6 weeks (until 11/18/2015).  Other changes have been made to your medicines, so please review your discharge medication list and take them as prescribed.  Please wear your compression stockings all the time except when bathing for next 6 months.  Please follow-up with your PCP and Dr Pascal Lux, the Interventional Radiologist who dissolved the clot.  If you have chest pain, shortness of breath, or bleeding that will not stop, come the the ED.  Enoxaparin injection What is this medicine? ENOXAPARIN (ee nox a PA rin) is used after knee, hip, or abdominal surgeries to prevent blood clotting. It is also used to treat existing blood clots in the lungs or in the veins. This medicine may be used for other purposes; ask your health care provider or pharmacist if you have questions. What should I tell my health care provider before I take this medicine? They need to know if you have any of these conditions: -bleeding disorders, hemorrhage, or hemophilia -infection of the heart or heart valves -kidney or liver disease -previous stroke -prosthetic heart valve -recent surgery or delivery of a baby -ulcer in the stomach or intestine, diverticulitis, or other bowel disease -an unusual or allergic reaction to enoxaparin, heparin, pork or pork products, other medicines, foods, dyes, or preservatives -pregnant or trying to get pregnant -breast-feeding How should I use this medicine? This medicine is for injection under the skin. It is  usually given by a health-care professional. You or a family member may be trained on how to give the injections. If you are to give yourself injections, make sure you understand how to use the syringe, measure the dose if necessary, and give the injection. To avoid bruising, do not rub the site where this medicine has been injected. Do not take your medicine more often than directed. Do not stop taking except on the advice of your doctor or health care professional. Make sure you receive a puncture-resistant container to dispose of the needles and syringes once you have finished with them. Do not reuse these items. Return the container to your doctor or health care professional for proper disposal. Talk to your pediatrician regarding the use of this medicine in children. Special care may be needed. Overdosage: If you think you have taken too much of this medicine contact a poison control center or emergency room at once. NOTE: This medicine is only for you. Do not share this medicine with others. What if I miss a dose? If you miss a dose, take it as soon as you can. If it is almost time for your next dose, take only that dose. Do not take double or extra doses. What may interact with this medicine? -aspirin and aspirin-like medicines -certain medicines that treat or prevent blood clots -dipyridamole -NSAIDs, medicines for pain and inflammation, like ibuprofen or naproxen This list may not describe all possible interactions. Give your health care provider a list of all the medicines, herbs, non-prescription drugs, or dietary supplements you use. Also tell  them if you smoke, drink alcohol, or use illegal drugs. Some items may interact with your medicine. What should I watch for while using this medicine? Visit your doctor or health care professional for regular checks on your progress. Your condition will be monitored carefully while you are receiving this medicine. Notify your doctor or health care  professional and seek emergency treatment if you develop breathing problems; changes in vision; chest pain; severe, sudden headache; pain, swelling, warmth in the leg; trouble speaking; sudden numbness or weakness of the face, arm, or leg. These can be signs that your condition has gotten worse. If you are going to have surgery, tell your doctor or health care professional that you are taking this medicine. Do not stop taking this medicine without first talking to your doctor. Be sure to refill your prescription before you run out of medicine. Avoid sports and activities that might cause injury while you are using this medicine. Severe falls or injuries can cause unseen bleeding. Be careful when using sharp tools or knives. Consider using an Copy. Take special care brushing or flossing your teeth. Report any injuries, bruising, or red spots on the skin to your doctor or health care professional. What side effects may I notice from receiving this medicine? Side effects that you should report to your doctor or health care professional as soon as possible: -allergic reactions like skin rash, itching or hives, swelling of the face, lips, or tongue -feeling faint or lightheaded, falls -signs and symptoms of bleeding such as bloody or black, tarry stools; red or dark-brown urine; spitting up blood or brown material that looks like coffee grounds; red spots on the skin; unusual bruising or bleeding from the eye, gums, or nose Side effects that usually do not require medical attention (report to your doctor or health care professional if they continue or are bothersome): -pain, redness, or irritation at site where injected This list may not describe all possible side effects. Call your doctor for medical advice about side effects. You may report side effects to FDA at 1-800-FDA-1088. Where should I keep my medicine? Keep out of the reach of children. Store at room temperature between 15 and 30  degrees C (59 and 86 degrees F). Do not freeze. If your injections have been specially prepared, you may need to store them in the refrigerator. Ask your pharmacist. Throw away any unused medicine after the expiration date. NOTE: This sheet is a summary. It may not cover all possible information. If you have questions about this medicine, talk to your doctor, pharmacist, or health care provider.    2016, Elsevier/Gold Standard. (2013-04-28 16:06:21)    Deep Vein Thrombosis A deep vein thrombosis (DVT) is a blood clot (thrombus) that usually occurs in a deep, larger vein of the lower leg or the pelvis, or in an upper extremity such as the arm. These are dangerous and can lead to serious and even life-threatening complications if the clot travels to the lungs. A DVT can damage the valves in your leg veins so that instead of flowing upward, the blood pools in the lower leg. This is called post-thrombotic syndrome, and it can result in pain, swelling, discoloration, and sores on the leg. CAUSES A DVT is caused by the formation of a blood clot in your leg, pelvis, or arm. Usually, several things contribute to the formation of blood clots. A clot may develop when:  Your blood flow slows down.  Your vein becomes damaged in some way.  You have a condition that makes your blood clot more easily. RISK FACTORS A DVT is more likely to develop in:  People who are older, especially over 22 years of age.  People who are overweight (obese).  People who sit or lie still for a long time, such as during long-distance travel (over 4 hours), bed rest, hospitalization, or during recovery from certain medical conditions like a stroke.  People who do not engage in much physical activity (sedentary lifestyle).  People who have chronic breathing disorders.  People who have a personal or family history of blood clots or blood clotting disease.  People who have peripheral vascular disease (PVD), diabetes, or  some types of cancer.  People who have heart disease, especially if the person had a recent heart attack or has congestive heart failure.  People who have neurological diseases that affect the legs (leg paresis).  People who have had a traumatic injury, such as breaking a hip or leg.  People who have recently had major or lengthy surgery, especially on the hip, knee, or abdomen.  People who have had a central line placed inside a large vein.  People who take medicines that contain the hormone estrogen. These include birth control pills and hormone replacement therapy.  Pregnancy or during childbirth or the postpartum period.  Long plane flights (over 8 hours). SIGNS AND SYMPTOMS Symptoms of a DVT can include:   Swelling of your leg or arm, especially if one side is much worse.  Warmth and redness of your leg or arm, especially if one side is much worse.  Pain in your arm or leg. If the clot is in your leg, symptoms may be more noticeable or worse when you stand or walk.  A feeling of pins and needles, if the clot is in the arm. The symptoms of a DVT that has traveled to the lungs (pulmonary embolism, PE) usually start suddenly and include:  Shortness of breath while active or at rest.  Coughing or coughing up blood or blood-tinged mucus.  Chest pain that is often worse with deep breaths.  Rapid or irregular heartbeat.  Feeling light-headed or dizzy.  Fainting.  Feeling anxious.  Sweating. There may also be pain and swelling in a leg if that is where the blood clot started. These symptoms may represent a serious problem that is an emergency. Do not wait to see if the symptoms will go away. Get medical help right away. Call your local emergency services (911 in the [place] Do not drive yourself to the hospital. DIAGNOSIS Your health care provider will take a medical history and perform a physical exam. You may also have other tests, including:  Blood tests to assess the  clotting properties of your blood.  Imaging tests, such as CT, ultrasound, MRI, X-ray, and other tests to see if you have clots anywhere in your body. TREATMENT After a DVT is identified, it can be treated. The type of treatment that you receive depends on many factors, such as the cause of your DVT, your risk for bleeding or developing more clots, and other medical conditions that you have. Sometimes, a combination of treatments is necessary. Treatment options may be combined and include:  Monitoring the blood clot with ultrasound.  Taking medicines by mouth, such as newer blood thinners (anticoagulants), thrombolytics, or warfarin.  Taking anticoagulant medicine by injection or through an IV tube.  Wearing compression stockings or using different types ofdevices.  Surgery (rare) to remove the blood clot or  to place a filter in your abdomen to stop the blood clot from traveling to your lungs. Treatments for a DVT are often divided into immediate treatment and long-term treatment (up to 3 months after DVT). You can work with your health care provider to choose the treatment program that is best for you. HOME CARE INSTRUCTIONS If you are taking a newer oral anticoagulant:  Take the medicine every single day at the same time each day.  Understand what foods and drugs interact with this medicine.  Understand that there are no regular blood tests required when using this medicine.  Understand the side effects of this medicine, including excessive bruising or bleeding. Ask your health care provider or pharmacist about other possible side effects. If you are taking warfarin:  Understand how to take warfarin and know which foods can affect how warfarin works in Veterinary surgeon.  Understand that it is dangerous to take too much or too little warfarin. Too much warfarin increases the risk of bleeding. Too little warfarin continues to allow the risk for blood clots.  Follow your PT and INR blood  testing schedule. The PT and INR results allow your health care provider to adjust your dose of warfarin. It is very important that you have your PT and INR tested as often as told by your health care provider.  Avoid major changes in your diet, or tell your health care provider before you change your diet. Arrange a visit with a registered dietitian to answer your questions. Many foods, especially foods that are high in vitamin K, can interfere with warfarin and affect the PT and INR results. Eat a consistent amount of foods that are high in vitamin K, such as:  Spinach, kale, broccoli, cabbage, collard greens, turnip greens, Brussels sprouts, peas, cauliflower, seaweed, and parsley.  Beef liver and pork liver.  Green tea.  Soybean oil.  Tell your health care provider about any and all medicines, vitamins, and supplements that you take, including aspirin and other over-the-counter anti-inflammatory medicines. Be especially cautious with aspirin and anti-inflammatory medicines. Do not take those before you ask your health care provider if it is safe to do so. This is important because many medicines can interfere with warfarin and affect the PT and INR results.  Do not start or stop taking any over-the-counter or prescription medicine unless your health care provider or pharmacist tells you to do so. If you take warfarin, you will also need to do these things:  Hold pressure over cuts for longer than usual.  Tell your dentist and other health care providers that you are taking warfarin before you have any procedures in which bleeding may occur.  Avoid alcohol or drink very small amounts. Tell your health care provider if you change your alcohol intake.  Do not use tobacco products, including cigarettes, chewing tobacco, and e-cigarettes. If you need help quitting, ask your health care provider.  Avoid contact sports. General Instructions  Take over-the-counter and prescription medicines  only as told by your health care provider. Anticoagulant medicines can have side effects, including easy bruising and difficulty stopping bleeding. If you are prescribed an anticoagulant, you will also need to do these things:  Hold pressure over cuts for longer than usual.  Tell your dentist and other health care providers that you are taking anticoagulants before you have any procedures in which bleeding may occur.  Avoid contact sports.  Wear a medical alert bracelet or carry a medical alert card that says you have  had a PE.  Ask your health care provider how soon you can go back to your normal activities. Stay active to prevent new blood clots from forming.  Make sure to exercise while traveling or when you have been sitting or standing for a long period of time. It is very important to exercise. Exercise your legs by walking or by tightening and relaxing your leg muscles often. Take frequent walks.  Wear compression stockings as told by your health care provider to help prevent more blood clots from forming.  Do not use tobacco products, including cigarettes, chewing tobacco, and e-cigarettes. If you need help quitting, ask your health care provider.  Keep all follow-up appointments with your health care provider. This is important. PREVENTION Take these actions to decrease your risk of developing another DVT:  Exercise regularly. For at least 30 minutes every day, engage in:  Activity that involves moving your arms and legs.  Activity that encourages good blood flow through your body by increasing your heart rate.  Exercise your arms and legs every hour during long-distance travel (over 4 hours). Drink plenty of water and avoid drinking alcohol while traveling.  Avoid sitting or lying in bed for long periods of time without moving your legs.  Maintain a weight that is appropriate for your height. Ask your health care provider what weight is healthy for you.  If you are a woman  who is over 2 years of age, avoid unnecessary use of medicines that contain estrogen. These include birth control pills.  Do not smoke, especially if you take estrogen medicines. If you need help quitting, ask your health care provider. If you are hospitalized, prevention measures may include:  Early walking after surgery, as soon as your health care provider says that it is safe.  Receiving anticoagulants to prevent blood clots.If you cannot take anticoagulants, other options may be available, such as wearing compression stockings or using different types of devices. SEEK IMMEDIATE MEDICAL CARE IF:  You have new or increased pain, swelling, or redness in an arm or leg.  You have numbness or tingling in an arm or leg.  You have shortness of breath while active or at rest.  You have chest pain.  You have a rapid or irregular heartbeat.  You feel light-headed or dizzy.  You cough up blood.  You notice blood in your vomit, bowel movement, or urine. These symptoms may represent a serious problem that is an emergency. Do not wait to see if the symptoms will go away. Get medical help right away. Call your local emergency services (911 in the [place] Do not drive yourself to the hospital.   This information is not intended to replace advice given to you by your health care provider. Make sure you discuss any questions you have with your health care provider.   Document Released: 12/25/2004 Document Revised: 09/15/2014 Document Reviewed: 04/21/2014 Elsevier Interactive Patient Education [employer].

## 2015-10-02 ENCOUNTER — Observation Stay (HOSPITAL_COMMUNITY): Payer: Medicare HMO

## 2015-10-02 DIAGNOSIS — N189 Chronic kidney disease, unspecified: Secondary | ICD-10-CM | POA: Diagnosis present

## 2015-10-02 DIAGNOSIS — J441 Chronic obstructive pulmonary disease with (acute) exacerbation: Secondary | ICD-10-CM | POA: Diagnosis not present

## 2015-10-02 DIAGNOSIS — D649 Anemia, unspecified: Secondary | ICD-10-CM | POA: Diagnosis not present

## 2015-10-02 DIAGNOSIS — M79609 Pain in unspecified limb: Secondary | ICD-10-CM | POA: Diagnosis not present

## 2015-10-02 DIAGNOSIS — E785 Hyperlipidemia, unspecified: Secondary | ICD-10-CM | POA: Diagnosis present

## 2015-10-02 DIAGNOSIS — I82413 Acute embolism and thrombosis of femoral vein, bilateral: Secondary | ICD-10-CM | POA: Diagnosis present

## 2015-10-02 DIAGNOSIS — R0902 Hypoxemia: Secondary | ICD-10-CM | POA: Diagnosis not present

## 2015-10-02 DIAGNOSIS — I82422 Acute embolism and thrombosis of left iliac vein: Secondary | ICD-10-CM | POA: Diagnosis present

## 2015-10-02 DIAGNOSIS — Z9981 Dependence on supplemental oxygen: Secondary | ICD-10-CM | POA: Diagnosis not present

## 2015-10-02 DIAGNOSIS — M79605 Pain in left leg: Secondary | ICD-10-CM | POA: Diagnosis present

## 2015-10-02 DIAGNOSIS — I82443 Acute embolism and thrombosis of tibial vein, bilateral: Secondary | ICD-10-CM | POA: Diagnosis present

## 2015-10-02 DIAGNOSIS — R609 Edema, unspecified: Secondary | ICD-10-CM | POA: Diagnosis not present

## 2015-10-02 DIAGNOSIS — I447 Left bundle-branch block, unspecified: Secondary | ICD-10-CM | POA: Diagnosis present

## 2015-10-02 DIAGNOSIS — I2510 Atherosclerotic heart disease of native coronary artery without angina pectoris: Secondary | ICD-10-CM | POA: Diagnosis present

## 2015-10-02 DIAGNOSIS — E1122 Type 2 diabetes mellitus with diabetic chronic kidney disease: Secondary | ICD-10-CM | POA: Diagnosis present

## 2015-10-02 DIAGNOSIS — Z794 Long term (current) use of insulin: Secondary | ICD-10-CM | POA: Diagnosis not present

## 2015-10-02 DIAGNOSIS — I130 Hypertensive heart and chronic kidney disease with heart failure and stage 1 through stage 4 chronic kidney disease, or unspecified chronic kidney disease: Secondary | ICD-10-CM | POA: Diagnosis present

## 2015-10-02 DIAGNOSIS — Z7984 Long term (current) use of oral hypoglycemic drugs: Secondary | ICD-10-CM | POA: Diagnosis not present

## 2015-10-02 DIAGNOSIS — Z79899 Other long term (current) drug therapy: Secondary | ICD-10-CM | POA: Diagnosis not present

## 2015-10-02 DIAGNOSIS — J9601 Acute respiratory failure with hypoxia: Secondary | ICD-10-CM | POA: Diagnosis not present

## 2015-10-02 DIAGNOSIS — I5042 Chronic combined systolic (congestive) and diastolic (congestive) heart failure: Secondary | ICD-10-CM | POA: Diagnosis present

## 2015-10-02 DIAGNOSIS — I82402 Acute embolism and thrombosis of unspecified deep veins of left lower extremity: Secondary | ICD-10-CM | POA: Diagnosis not present

## 2015-10-02 DIAGNOSIS — I10 Essential (primary) hypertension: Secondary | ICD-10-CM | POA: Diagnosis not present

## 2015-10-02 DIAGNOSIS — Z951 Presence of aortocoronary bypass graft: Secondary | ICD-10-CM | POA: Diagnosis not present

## 2015-10-02 DIAGNOSIS — I82433 Acute embolism and thrombosis of popliteal vein, bilateral: Secondary | ICD-10-CM | POA: Diagnosis present

## 2015-10-02 DIAGNOSIS — Z7982 Long term (current) use of aspirin: Secondary | ICD-10-CM | POA: Diagnosis not present

## 2015-10-02 DIAGNOSIS — Z6841 Body Mass Index (BMI) 40.0 and over, adult: Secondary | ICD-10-CM | POA: Diagnosis not present

## 2015-10-02 DIAGNOSIS — I82493 Acute embolism and thrombosis of other specified deep vein of lower extremity, bilateral: Secondary | ICD-10-CM | POA: Diagnosis not present

## 2015-10-02 DIAGNOSIS — E1165 Type 2 diabetes mellitus with hyperglycemia: Secondary | ICD-10-CM | POA: Diagnosis present

## 2015-10-02 DIAGNOSIS — G4733 Obstructive sleep apnea (adult) (pediatric): Secondary | ICD-10-CM | POA: Diagnosis present

## 2015-10-02 DIAGNOSIS — I255 Ischemic cardiomyopathy: Secondary | ICD-10-CM | POA: Diagnosis present

## 2015-10-02 LAB — HEMOGLOBIN A1C
Hgb A1c MFr Bld: 8.3 % — ABNORMAL HIGH (ref 4.8–5.6)
MEAN PLASMA GLUCOSE: 192 mg/dL

## 2015-10-02 LAB — CBC
HCT: 33.4 % — ABNORMAL LOW (ref 36.0–46.0)
Hemoglobin: 10.4 g/dL — ABNORMAL LOW (ref 12.0–15.0)
MCH: 29.2 pg (ref 26.0–34.0)
MCHC: 31.1 g/dL (ref 30.0–36.0)
MCV: 93.8 fL (ref 78.0–100.0)
PLATELETS: 209 10*3/uL (ref 150–400)
RBC: 3.56 MIL/uL — ABNORMAL LOW (ref 3.87–5.11)
RDW: 15.5 % (ref 11.5–15.5)
WBC: 5.6 10*3/uL (ref 4.0–10.5)

## 2015-10-02 LAB — BASIC METABOLIC PANEL
Anion gap: 9 (ref 5–15)
BUN: 15 mg/dL (ref 6–20)
CO2: 26 mmol/L (ref 22–32)
CREATININE: 1.27 mg/dL — AB (ref 0.44–1.00)
Calcium: 9.2 mg/dL (ref 8.9–10.3)
Chloride: 102 mmol/L (ref 101–111)
GFR calc Af Amer: 50 mL/min — ABNORMAL LOW (ref >60)
GFR, EST NON AFRICAN AMERICAN: 43 mL/min — AB (ref >60)
GLUCOSE: 177 mg/dL — AB (ref 65–99)
POTASSIUM: 3.9 mmol/L (ref 3.5–5.1)
SODIUM: 137 mmol/L (ref 135–145)

## 2015-10-02 LAB — HEPARIN LEVEL (UNFRACTIONATED): Heparin Unfractionated: >2.2 IU/mL — ABNORMAL HIGH (ref 0.30–0.70)

## 2015-10-02 LAB — GLUCOSE, CAPILLARY
GLUCOSE-CAPILLARY: 180 mg/dL — AB (ref 65–99)
Glucose-Capillary: 203 mg/dL — ABNORMAL HIGH (ref 65–99)
Glucose-Capillary: 180 mg/dL — ABNORMAL HIGH (ref 65–99)
Glucose-Capillary: 171 mg/dL — ABNORMAL HIGH (ref 65–99)

## 2015-10-02 MED ORDER — HEPARIN (PORCINE) IN NACL 100-0.45 UNIT/ML-% IJ SOLN
1150.0000 [IU]/h | INTRAMUSCULAR | Status: DC
  Administered 2015-10-02: 1250 [IU]/h via INTRAVENOUS
  Administered 2015-10-03: 1150 [IU]/h via INTRAVENOUS
  Filled 2015-10-02 (×3): qty 250

## 2015-10-02 MED ORDER — LIVING WELL WITH DIABETES BOOK
Freq: Once | Status: AC
  Administered 2015-10-02: 09:00:00
  Filled 2015-10-02: qty 1

## 2015-10-02 MED ORDER — HYDROMORPHONE HCL 1 MG/ML IJ SOLN
0.5000 mg | Freq: Once | INTRAMUSCULAR | Status: AC
  Administered 2015-10-02: 0.5 mg via INTRAVENOUS
  Filled 2015-10-02: qty 1

## 2015-10-02 MED ORDER — POLYETHYLENE GLYCOL 3350 17 G PO PACK
17.0000 g | PACK | Freq: Every day | ORAL | Status: DC | PRN
  Filled 2015-10-02: qty 1

## 2015-10-02 MED ORDER — INSULIN STARTER KIT- PEN NEEDLES (ENGLISH)
1.0000 | Freq: Once | Status: AC
  Administered 2015-10-02: 1
  Filled 2015-10-02: qty 1

## 2015-10-02 MED ORDER — INSULIN ASPART 100 UNIT/ML ~~LOC~~ SOLN
12.0000 [IU] | Freq: Three times a day (TID) | SUBCUTANEOUS | Status: DC
  Administered 2015-10-02: 12 [IU] via SUBCUTANEOUS

## 2015-10-02 NOTE — Progress Notes (Signed)
Inpatient Diabetes Program Recommendations  AACE/ADA: New Consensus Statement on Inpatient Glycemic Control (2015)  Target Ranges:  Prepandial:   less than 140 mg/dL      Peak postprandial:   less than 180 mg/dL (1-2 hours)      Critically ill patients:  140 - 180 mg/dL    Review of Glycemic Control  Diabetes history: DM2 Outpatient Diabetes medications: Novolog insulin 50 units ac breakfast, 30 units ac lunch, & 50 units ac supper Current orders for Inpatient glycemic control: Lantus 20 units + Novolog 10 units tid meal coverage+ Novolog correction 0-9 units tid + 0-5 units hs  Inpatient Diabetes Program Recommendations:   Received consult regarding insulin teaching. Nurses, please use insulin pen station on units to teach and  review with pen. Will order insulin starter kit and videos for education along with Living Well With Diabetes Book. Please order A1c to determine prehospital glycemic control.  Thank you, Nani Gasser. Dathan Attia, RN, MSN, CDE Inpatient Glycemic Control Team Team Pager [phone] (8am-5pm) 10/02/2015 8:28 AM

## 2015-10-02 NOTE — Progress Notes (Signed)
Subjective: Patient was seen and examined this morning. Patient continues to have left lower extremity pain and swelling. She denies pain or swelling in her right leg. She had an episode of chest pain due to indigestion last night that was not associated with shortness of breath and was relieved with Maalox. Troponin and EKG were unchanged.   Objective: Vital signs in last 24 hours: Vitals:   10/01/15 1834 10/01/15 1945 10/01/15 2238 10/02/15 0641  BP: (!) 102/43 (!) 112/48 (!) 101/44 (!) 127/56  Pulse: (!) 58 (!) 56 (!) 55 61  Resp: 20 18 17 17   Temp: 99 F (37.2 C)  98.6 F (37 C) 98.2 F (36.8 C)  TempSrc: Oral  Oral   SpO2: 98% 99% 99% 98%  Weight:    251 lb 4.8 oz (114 kg)  Height:       Physical Exam General: Vital signs reviewed.  Patient is obese, in no acute distress and cooperative with exam.  Cardiovascular: Bradycardic, regular rhythm Pulmonary/Chest: Clear to auscultation bilaterally, no wheezes, rales, or rhonchi. Abdominal: Soft, non-tender, non-distended, BS + Extremities: 2-3+ pitting edema of LLE with increased warmth and tenderness along deep venous system, Trace pitting edema of right lower extremity without increased warmth or tenderness.  Psychiatric: Normal mood and affect. speech and behavior is normal. Cognition and memory are normal.   Assessment/Plan: Principal Problem:   DVT (deep venous thrombosis), left Active Problems:   Ischemic cardiomyopathy   CAD (coronary artery disease)   Hypertension   HLD (hyperlipidemia)   Insulin dependent diabetes mellitus (HCC)   S/P CABG x 5   Chronic combined systolic and diastolic CHF, NYHA class 3 (HCC)   Left leg pain  Ms. Placzek is a 65yo female with past medical history of CHF related to ischemic cardiomyopathy on intermittent O2 of 2L at home, CAD s/p CABGx5 and ICD placement, T2DM, HTN, OSA, and HLD. She has been having increasing left leg pain and swelling for 3 months.  Chronic LLE DVT and Acute RLE  DVT: Repeat bilateral lower extremity duplex revealed significant, acute, occlusive DVT noted in the left common femoral, femoral, profunda, popliteal, peroneal, and posterior tibial veins, as well as at the saphenofemoral junction. There is also propagation of DVT noted to the right common femoral, femoral, popliteal, and posterior tibial veins. Patient has been without signs of PE. Vital signs are stable. She was on heparin gtt initially and then transitioned to Xarelto yesterday prior to the repeat studies. Patient was seen by IR yesterday and patient was prevented with the options of IR thrombolysis. Given these new findings, IR re-discussed options with patient and family and plans are tentatively for LE venography with poss lytic therapy/endovascular intervention tomorrow, 9/25. If intervention is planned, patient will be managed in the ICU. -IR following, appreciate recommendations -Heparin gtt -Stop Xarelto -NPO midnight  -CBC/CMET tomorrow am  -PT/OT for possible SNF placement  -Oxycodone 5 mg Q6H prn pain  T2DM: Patient is on glipizide 5 mg daily and Novolog 50U/30U/50U with meals at home. We have added long acting insulin to her regimen with controlled of her CBGs in the 140-200s. -CBG ACHS -Novolog 12 units TID WC -Lantus 20 units QHS -SSI-S -A1c pending  HTN: Normotensive. Continue home medications. -Coreg 12.5 mg BiD -Lasix 80 mg BID -Imdur 30 mg QD -Lisinopril 10 mg daily -Sprinolactone 12.5 mg QD  DVT/PE ppx: Heparin gtt CODE: FULL FEN: CM, NPO midnight  Dispo: Anticipated discharge in approximately 3 day(s).   LOS:  0 days   Martyn Malay, DO PGY-3 Internal Medicine Resident Pager # [phone] 10/02/2015 1:37 PM

## 2015-10-02 NOTE — Progress Notes (Signed)
VASCULAR LAB PRELIMINARY  PRELIMINARY  PRELIMINARY  PRELIMINARY  Bilateral lower extremity venous duplex completed.    Preliminary report:  There is significant, acute, occlusive DVT noted in the left common femoral, femoral, profunda, popliteal, peroneal, and posterior tibial veins, as well as at the saphenofemoral junction.  There is also propagation of DVT noted to the right common femoral, femoral, popliteal, and posterior tibial veins.   Called report to [place], Therapist, sports.  Dyke Weible, RVT 10/02/2015, 10:48 AM

## 2015-10-02 NOTE — Progress Notes (Signed)
[facility] called me back, will get pt down for study.

## 2015-10-02 NOTE — Progress Notes (Signed)
S/W Dr. Quay Burow, [name] for PT to work with pt to evaluate for SNF.

## 2015-10-02 NOTE — Progress Notes (Signed)
ANTICOAGULATION CONSULT NOTE - Initial Consult  Pharmacy Consult for Heparin Indication: DVT  Allergies  Allergen Reactions  . Metformin And Related Itching, Swelling and Other (See Comments)    Leg pain & swelling in legs  . Atorvastatin Itching and Rash  . Levofloxacin Itching    Patient Measurements: Height: 5\' 4"  (162.6 cm) Weight: 251 lb 4.8 oz (114 kg) IBW/kg (Calculated) : 54.7 Heparin Dosing Weight: 82  Vital Signs: Temp: 98.2 F (36.8 C) (09/24 0641) BP: 127/56 (09/24 0641) Pulse Rate: 61 (09/24 0641)  Labs:  Recent Labs  09/30/15 1659 09/30/15 1912 10/01/15 0100 10/01/15 0811 10/01/15 2000 10/02/15 0212  HGB 12.9  --  11.4*  --   --  10.4*  HCT 39.6  --  34.6*  --   --  33.4*  PLT 221  --  208  --   --  209  LABPROT  --  14.2  --   --   --   --   INR  --  1.10  --   --   --   --   HEPARINUNFRC  --   --  0.51 0.48  --  >2.20*  CREATININE 0.98  --  1.07*  --   --  1.27*  TROPONINI  --   --   --   --  <0.03  --     Estimated Creatinine Clearance: 54.7 mL/min (by C-G formula based on SCr of 1.27 mg/dL (H)).   Medical History: Past Medical History:  Diagnosis Date  . Angina decubitus ([place]) 11/06/2012  . Angina pectoris ([place])   . CHF (congestive heart failure) ([place])   . Chronic combined systolic and diastolic CHF, NYHA class 3 ([place])   . Coronary artery disease    s/p CABG 2007  . Diabetes mellitus    type II  . Hypertension   . Ischemic cardiomyopathy    EF 30-35%, s/p ICD 4/08 by Dr Leonia Reeves  . Morbid obesity ([place]) 11/06/2012  . Obesity   . Oxygen dependent 2 L  . 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 Sleep apnea    uses O2 at night  . Ventricular tachycardia ([place]) 01/16/15   sustained VT terminated with ATP, CL 340 msec    Medications:  Scheduled:  . aspirin EC  81 mg Oral Daily  . carvedilol  12.5 mg Oral BID WC  . fenofibrate  160 mg Oral Daily   . furosemide  80 mg Oral BID  . insulin aspart  0-5 Units Subcutaneous QHS  . insulin aspart  0-9 Units Subcutaneous TID WC  . insulin aspart  10 Units Subcutaneous TID WC  . insulin glargine  20 Units Subcutaneous QHS  . isosorbide mononitrate  30 mg Oral Daily  . latanoprost  1 drop Both Eyes QHS  . lisinopril  10 mg Oral Daily  . spironolactone  12.5 mg Oral Daily    Assessment: 65yo female with extensive DVT on acute dosing of Xarelto, with last dose at 0830 today.  Now to change to Heparin with plans for lytic therapy to start 9/25.  Heparin level this AM > 2.2, reflecting Xarelto effect.  Hg and pltc stable, no bleeding noted.  Will manage heparin via PTTs until heparin levels correlate.  Goal of Therapy:  Heparin level 0.3-0.7 units/ml aPTT 66-102 seconds Monitor platelets by anticoagulation protocol: Yes  Plan:  Heparin 1250 units/hr, no bolus Check PTT 6hr after initiated Daily HL, PTT, CBC Watch for s/s of bleeding F/U plans for lytic therapy  Gracy Bruins, [facility]

## 2015-10-02 NOTE — Progress Notes (Signed)
Paged on call vascular person to call me concerning order.

## 2015-10-02 NOTE — Progress Notes (Signed)
Talked to Bethena Roys, Diabetes Educator, on the phone about recommendations.

## 2015-10-02 NOTE — Progress Notes (Signed)
Referring Physician(s): [place]:[id number]  Supervising Physician: Arne Cleveland  Patient Status:  Inpatient  Chief Complaint:  Left leg swelling/pain  Subjective:  Pt currently stable; denies CP, HA, abd/back pain, worsening dyspnea/cough, N/V or abnormal bleeding; cont to have LLE pain/swelling; denies RLE pain or swelling  Allergies: Metformin and related; Atorvastatin; and Levofloxacin  Medications: Prior to Admission medications   Medication Sig Start Date End Date Taking? Authorizing Provider  albuterol (PROVENTIL HFA;VENTOLIN HFA) 108 (90 BASE) MCG/ACT inhaler Inhale 2 puffs into the lungs every 6 (six) hours as needed for wheezing or shortness of breath.   Yes Historical Provider, MD  albuterol (PROVENTIL) (2.5 MG/3ML) 0.083% nebulizer solution Take 2.5 mg by nebulization every 6 (six) hours as needed for wheezing or shortness of breath.  09/30/15  Yes Historical Provider, MD  aspirin EC 81 MG EC tablet Take 1 tablet (81 mg total) by mouth daily. 09/11/12  Yes Jerline Pain, MD  carvedilol (COREG) 12.5 MG tablet Take 12.5 mg by mouth 2 (two) times daily with a meal.  03/30/15  Yes Historical Provider, MD  Choline Fenofibrate (FENOFIBRIC ACID) 135 MG CPDR Take 135 mg by mouth daily. 02/09/15  Yes Historical Provider, MD  fluconazole (DIFLUCAN) 150 MG tablet Take 150 mg by mouth daily.  05/15/15  Yes Historical Provider, MD  furosemide (LASIX) 80 MG tablet Take 1 tablet (80 mg total) by mouth 2 (two) times daily. 08/19/15  Yes Jerline Pain, MD  glipiZIDE (GLUCOTROL) 5 MG tablet Take 5 mg by mouth daily before breakfast.    Yes Historical Provider, MD  IRON PO Take 1 tablet by mouth daily.   Yes Historical Provider, MD  isosorbide mononitrate (IMDUR) 30 MG 24 hr tablet Take 1 tablet (30 mg total) by mouth daily. 08/19/15 11/17/15 Yes Jerline Pain, MD  ketoconazole (NIZORAL) 2 % cream Apply 1 application topically 2 (two) times daily.  03/30/15  Yes Historical Provider, MD  latanoprost  (XALATAN) 0.005 % ophthalmic solution Place 1 drop into both eyes at bedtime.   Yes Historical Provider, MD  lisinopril (PRINIVIL,ZESTRIL) 10 MG tablet Take 10 mg by mouth daily.    Yes Historical Provider, MD  meloxicam (MOBIC) 7.5 MG tablet Take 7.5 mg by mouth daily. 02/10/15  Yes Historical Provider, MD  methocarbamol (ROBAXIN) 500 MG tablet Take 1 tablet (500 mg total) by mouth every 8 (eight) hours as needed for muscle spasms. 05/30/14  Yes Barton Dubois, MD  nitroGLYCERIN (NITROSTAT) 0.4 MG SL tablet Place 1 tablet (0.4 mg total) under the tongue every 5 (five) minutes as needed. Patient taking differently: Place 0.4 mg under the tongue every 5 (five) minutes as needed for chest pain.  05/19/15  Yes Scott T Weaver, PA-C  NOVOLOG FLEXPEN 100 UNIT/ML FlexPen Inject 30-50 Units into the skin See admin instructions. 50 units every morning, 30 units at lunch and 50 units at suppre 09/12/15  Yes Historical Provider, MD  potassium chloride SA (K-DUR,KLOR-CON) 20 MEQ tablet Take 20 mEq by mouth. 09/12/15  Yes Historical Provider, MD  potassium phosphate, monobasic, (K-PHOS ORIGINAL) 500 MG tablet Take 500 mg by mouth daily as needed (takes when needed for spasms).    Yes Historical Provider, MD  spironolactone (ALDACTONE) 25 MG tablet Take 0.5 tablets (12.5 mg total) by mouth daily. 03/28/15  Yes Liliane Shi, PA-C     Vital Signs: BP (!) 127/56   Pulse 61   Temp 98.2 F (36.8 C)   Resp 17  Ht 5\' 4"  (1.626 m)   Wt 251 lb 4.8 oz (114 kg)   SpO2 98%   BMI 43.14 kg/m   Physical Exam awake/alert; persistent sig LLE edema, no sig RLE edema; exam otherwise unchanged from yesterday  Imaging: No results found.  Labs:  CBC:  Recent Labs  05/06/15 1843 09/30/15 1659 10/01/15 0100 10/02/15 0212  WBC 11.9* 7.7 7.1 5.6  HGB 13.2 12.9 11.4* 10.4*  HCT 39.9 39.6 34.6* 33.4*  PLT 192 221 208 209    COAGS:  Recent Labs  09/30/15 1912  INR 1.10    BMP:  Recent Labs  05/06/15 1843   09/20/15 1153 09/30/15 1659 10/01/15 0100 10/02/15 0212  NA 137  < > 136 138 137 137  K 4.4  < > 3.9 4.0 3.5 3.9  CL 102  < > 95* 103 104 102  CO2 22  < > 29 26 26 26   GLUCOSE 324*  < > 232* 289* 265* 177*  BUN 15  < > 13 14 13 15   CALCIUM 10.0  < > 9.6 10.0 9.3 9.2  CREATININE 1.27*  < > 0.88 0.98 1.07* 1.27*  GFRNONAA 43*  --   --  59* 53* 43*  GFRAA 50*  --   --  >60 >60 50*  < > = values in this interval not displayed.  LIVER FUNCTION TESTS:  Recent Labs  05/06/15 1843 09/30/15 1659  BILITOT 0.7 0.6  AST 25 19  ALT 24 15  ALKPHOS 29 78  PROT 7.7 8.5*  ALBUMIN 3.7 3.7    Assessment and Plan: 65 y.o. female with history of obesity, CHF/ICM on home oxygen therapy, coronary artery disease with prior CABG/ICD, diabetes, hypertension, obstructive sleep apnea, hyperlipidemia who was recently admitted to the hospital with three-month history of left leg pain and swelling. Initially patient felt symptoms were related to arthritis. She was seen by her primary care physician yesterday and subsequently ordered a left lower extremity venous Doppler which revealed thrombus in the femoral, profunda, popliteal and greater saphenous veins. She was then referred to the hospital for admission. Patient has no prior history of blood clots, malignancy, or recent surgery. She has a very sedentary lifestyle secondary to her cardiac status. She was initially  on IV heparin therapy with transition to xarelto but now plans are to resume IV heparin . Request now received for consideration of  lower extremity venography with possible endovascular/thrombolytic therapy. Follow up bilat LE venous doppler study done today reveals significant, acute, occlusive DVT noted in the left common femoral, femoral, profunda, popliteal, peroneal, and posterior tibial veins, as well as at the saphenofemoral junction. There was also propagation of DVT noted to the right common femoral, femoral, popliteal, and posterior tibial  veins. Above findings d/w Dr. Con Memos team and pt. Details/risks of LE venography with poss lytic therapy/endovascular intervention, including but not limited to, internal bleeding, infection, inability to completely lyse clot, contrast nephropathy, PE, need for ICU monitoring d/w pt /family extensively with their understanding and consent.  Tent plans are for LE/caval venography with  initiation of lytic therapy to symptomatic LLE on 9/25. Further plans/intervention will depend upon venogram findings.    Electronically Signed: D. Rowe Robert 10/02/2015, 11:44 AM   I spent a total of 30 minutes at the the patient's bedside AND on the patient's hospital floor or unit, greater than 50% of which was counseling/coordinating care for lower extremity venogram with possible thrombolysis/endovascular intervention    Patient ID:  [name], female   DOB: 1950-01-15, 65 y.o.   MRN: [id number]

## 2015-10-02 NOTE — Evaluation (Signed)
Physical Therapy Evaluation Patient Details Name: [name] MRN: [id number] DOB: 10/23/1950 Today's Date: 10/02/2015   History of Present Illness  65 y.o. female with history of obesity, CHF/ICM on home oxygen therapy, coronary artery disease with prior CABG/ICD, diabetes, hypertension, obstructive sleep apnea, hyperlipidemia who was recently admitted to the hospital with three-month history of left leg pain and swelling. Initially patient felt symptoms were related to arthritis. She was seen by her primary care physician yesterday and subsequently ordered a left lower extremity venous Doppler which revealed thrombus in the femoral, profunda, popliteal and greater saphenous veins. For possible venogram and lytic therapy 9/25  Clinical Impression   Pt admitted with above diagnosis. Pt currently with functional limitations due to the deficits listed below (see PT Problem List). Overall moving well; will likely be able to dc home with prn assist and HHPT follow up; will also assess pt for mobility post venogram planned for tomorrow;  Pt will benefit from skilled PT to increase their independence and safety with mobility to allow discharge to the venue listed below.       Follow Up Recommendations Home health PT;Supervision - Intermittent (Can pt havea personal care attemdant through her insurance?)    Equipment Recommendations  3in1 (PT)    Recommendations for Other Services OT consult     Precautions / Restrictions Precautions Precautions: Fall Precaution Comments: Fall risk greatly reduced with use of RW      Mobility  Bed Mobility Overal bed mobility: Needs Assistance Bed Mobility: Supine to Sit     Supine to sit: Supervision     General bed mobility comments: Used rails  Transfers Overall transfer level: Needs assistance Equipment used: 1 person hand held assist;Rolling walker (2 wheeled) Transfers: Sit to/from [facility] Sit to Stand: Min guard Stand  pivot transfers: Min guard       General transfer comment: Close guard for safety, but pt not needing physical assist; cues for hand palcement and safety  Ambulation/Gait Ambulation/Gait assistance: Min guard Ambulation Distance (Feet): 10 Feet Assistive device: Rolling walker (2 wheeled) Gait Pattern/deviations: Step-through pattern;Decreased step length - right;Decreased step length - left;Decreased stride length     General Gait Details: Cues to self-monitor for activity tolerance  Stairs            Wheelchair Mobility    Modified Rankin (Stroke Patients Only)       Balance                                             Pertinent Vitals/Pain Pain Assessment: 0-10 Pain Score: 8  Pain Location: LLE Pain Descriptors / Indicators: Aching Pain Intervention(s): Premedicated before session    Home Living Family/patient expects to be discharged to:: Private residence Living Arrangements: Children Available Help at Discharge: Family;Available PRN/intermittently Type of Home: House Home Access: Stairs to enter Entrance Stairs-Rails: None Entrance Stairs-Number of Steps: 3 Home Layout: One level Home Equipment: Walker - 2 wheels;Walker - 4 wheels;Shower seat;Other (comment) (Home O2)      Prior Function Level of Independence: Needs assistance   Gait / Transfers Assistance Needed: with Rollator RW; relatively sedentary  ADL's / Homemaking Assistance Needed: Son helps; has a meals on wheels type service        Hand Dominance        Extremity/Trunk Assessment   Upper Extremity Assessment: Overall [facility]  for tasks assessed           Lower Extremity Assessment: Generalized weakness (noted incr edema LLE compared to R)         Communication   Communication: No difficulties  Cognition Arousal/Alertness: Awake/alert Behavior During Therapy: WFL for tasks assessed/performed Overall Cognitive Status: Within Functional Limits for tasks  assessed                      General Comments General comments (skin integrity, edema, etc.): Session conducted on 2 L O2; sats remained near 100%    Exercises     Assessment/Plan    PT Assessment Patient needs continued PT services  PT Problem List Decreased activity tolerance;Decreased strength;Decreased balance;Decreased mobility;Decreased knowledge of use of DME;Decreased knowledge of precautions;Cardiopulmonary status limiting activity;Pain;Obesity          PT Treatment Interventions DME instruction;Gait training;Stair training;Functional mobility training;Therapeutic activities;Therapeutic exercise;Patient/family education    PT Goals (Current goals can be found in the Care Plan section)  Acute Rehab PT Goals Patient Stated Goal: did not state PT Goal Formulation: With patient Time For Goal Achievement: 10/16/15 Potential to Achieve Goals: Good    Frequency Min 3X/week   Barriers to discharge Decreased caregiver support Pt will be home alone for a few hours at a time; will need to be modified independent to go home    Co-evaluation               End of Session Equipment Utilized During Treatment: Oxygen Activity Tolerance: Patient tolerated treatment well Patient left: in chair;with call bell/phone within reach;with family/visitor present Nurse Communication: Mobility status         Time: 1725-1745 PT Time Calculation (min) (ACUTE ONLY): 20 min   Charges:   PT Evaluation $PT Eval Moderate Complexity: 1 Procedure     PT G CodesQuin Hoop 10/02/2015, 6:24 PM  Roney Marion, PT  Acute Rehabilitation Services Pager [phone] Office [phone]

## 2015-10-02 NOTE — Progress Notes (Signed)
[name], [facility], that pt has clotting right and left (per called report from Vasc lab).

## 2015-10-02 NOTE — Progress Notes (Signed)
Called pharmacy regarding heparin protocol.

## 2015-10-03 ENCOUNTER — Encounter (HOSPITAL_COMMUNITY): Payer: Self-pay | Admitting: General Practice

## 2015-10-03 ENCOUNTER — Inpatient Hospital Stay (HOSPITAL_COMMUNITY): Payer: Medicare HMO

## 2015-10-03 DIAGNOSIS — I82443 Acute embolism and thrombosis of tibial vein, bilateral: Secondary | ICD-10-CM

## 2015-10-03 DIAGNOSIS — I82433 Acute embolism and thrombosis of popliteal vein, bilateral: Secondary | ICD-10-CM

## 2015-10-03 DIAGNOSIS — I82493 Acute embolism and thrombosis of other specified deep vein of lower extremity, bilateral: Secondary | ICD-10-CM

## 2015-10-03 DIAGNOSIS — R5381 Other malaise: Secondary | ICD-10-CM

## 2015-10-03 DIAGNOSIS — Z950 Presence of cardiac pacemaker: Secondary | ICD-10-CM

## 2015-10-03 DIAGNOSIS — I82413 Acute embolism and thrombosis of femoral vein, bilateral: Principal | ICD-10-CM

## 2015-10-03 DIAGNOSIS — Z6841 Body Mass Index (BMI) 40.0 and over, adult: Secondary | ICD-10-CM

## 2015-10-03 HISTORY — PX: IR GENERIC HISTORICAL: IMG1180011

## 2015-10-03 LAB — CBC
HCT: 34.9 % — ABNORMAL LOW (ref 36.0–46.0)
HEMATOCRIT: 37.1 % (ref 36.0–46.0)
Hemoglobin: 11.9 g/dL — ABNORMAL LOW (ref 12.0–15.0)
Hemoglobin: 11.1 g/dL — ABNORMAL LOW (ref 12.0–15.0)
MCH: 29.9 pg (ref 26.0–34.0)
MCH: 29.5 pg (ref 26.0–34.0)
MCHC: 32.1 g/dL (ref 30.0–36.0)
MCHC: 31.8 g/dL (ref 30.0–36.0)
MCV: 93.2 fL (ref 78.0–100.0)
MCV: 92.8 fL (ref 78.0–100.0)
PLATELETS: 232 10*3/uL (ref 150–400)
PLATELETS: 229 10*3/uL (ref 150–400)
RBC: 3.98 MIL/uL (ref 3.87–5.11)
RBC: 3.76 MIL/uL — ABNORMAL LOW (ref 3.87–5.11)
RDW: 15.2 % (ref 11.5–15.5)
RDW: 15.2 % (ref 11.5–15.5)
WBC: 6.3 10*3/uL (ref 4.0–10.5)
WBC: 5.6 10*3/uL (ref 4.0–10.5)

## 2015-10-03 LAB — COMPREHENSIVE METABOLIC PANEL
ALBUMIN: 3.4 g/dL — AB (ref 3.5–5.0)
ALT: 16 U/L (ref 14–54)
AST: 20 U/L (ref 15–41)
Alkaline Phosphatase: 62 U/L (ref 38–126)
Anion gap: 8 (ref 5–15)
BUN: 15 mg/dL (ref 6–20)
CHLORIDE: 99 mmol/L — AB (ref 101–111)
CO2: 26 mmol/L (ref 22–32)
CREATININE: 1.16 mg/dL — AB (ref 0.44–1.00)
Calcium: 9.7 mg/dL (ref 8.9–10.3)
GFR calc Af Amer: 56 mL/min — ABNORMAL LOW (ref >60)
GFR, EST NON AFRICAN AMERICAN: 48 mL/min — AB (ref >60)
GLUCOSE: 205 mg/dL — AB (ref 65–99)
POTASSIUM: 4.4 mmol/L (ref 3.5–5.1)
Sodium: 133 mmol/L — ABNORMAL LOW (ref 135–145)
Total Bilirubin: 0.5 mg/dL (ref 0.3–1.2)
Total Protein: 7.8 g/dL (ref 6.5–8.1)

## 2015-10-03 LAB — HEPARIN LEVEL (UNFRACTIONATED)

## 2015-10-03 LAB — GLUCOSE, CAPILLARY
GLUCOSE-CAPILLARY: 132 mg/dL — AB (ref 65–99)
GLUCOSE-CAPILLARY: 177 mg/dL — AB (ref 65–99)
Glucose-Capillary: 205 mg/dL — ABNORMAL HIGH (ref 65–99)
Glucose-Capillary: 153 mg/dL — ABNORMAL HIGH (ref 65–99)

## 2015-10-03 LAB — PROTIME-INR
INR: 1.86
PROTHROMBIN TIME: 21.7 s — AB (ref 11.4–15.2)

## 2015-10-03 LAB — APTT
APTT: 88 s — AB (ref 24–36)
APTT: 45 s — AB (ref 24–36)
aPTT: 103 seconds — ABNORMAL HIGH (ref 24–36)
aPTT: >200 seconds (ref 24–36)

## 2015-10-03 LAB — TYPE AND SCREEN
ABO/RH(D): A POS
Antibody Screen: NEGATIVE

## 2015-10-03 LAB — MRSA PCR SCREENING: MRSA by PCR: NEGATIVE

## 2015-10-03 LAB — FIBRINOGEN
FIBRINOGEN: 445 mg/dL (ref 210–475)
Fibrinogen: 447 mg/dL (ref 210–475)

## 2015-10-03 LAB — HEMOGLOBIN A1C
HEMOGLOBIN A1C: 7.8 % — AB (ref 4.8–5.6)
Mean Plasma Glucose: 177 mg/dL

## 2015-10-03 MED ORDER — SODIUM CHLORIDE 0.9% FLUSH
3.0000 mL | Freq: Two times a day (BID) | INTRAVENOUS | Status: DC
  Administered 2015-10-03 – 2015-10-06 (×6): 3 mL via INTRAVENOUS

## 2015-10-03 MED ORDER — HYDRALAZINE HCL 20 MG/ML IJ SOLN: 5.0000 mg | INTRAMUSCULAR | Status: DC | PRN

## 2015-10-03 MED ORDER — IOPAMIDOL (ISOVUE-300) INJECTION 61%
INTRAVENOUS | Status: AC
  Filled 2015-10-03: qty 50

## 2015-10-03 MED ORDER — LIDOCAINE HCL (PF) 1 % IJ SOLN
INTRAMUSCULAR | Status: AC | PRN
  Administered 2015-10-03: 12 mL
  Administered 2015-10-04: 5 mL

## 2015-10-03 MED ORDER — MIDAZOLAM HCL 2 MG/2ML IJ SOLN
INTRAMUSCULAR | Status: AC | PRN
  Administered 2015-10-03: 1 mg via INTRAVENOUS

## 2015-10-03 MED ORDER — ONDANSETRON HCL 4 MG/2ML IJ SOLN: 4.0000 mg | Freq: Four times a day (QID) | INTRAMUSCULAR | Status: DC | PRN

## 2015-10-03 MED ORDER — MIDAZOLAM HCL 2 MG/2ML IJ SOLN
1.0000 mg | INTRAMUSCULAR | Status: DC | PRN
  Administered 2015-10-03: 1 mg via INTRAVENOUS
  Filled 2015-10-03: qty 2

## 2015-10-03 MED ORDER — MIDAZOLAM HCL 2 MG/2ML IJ SOLN
INTRAMUSCULAR | Status: AC
  Filled 2015-10-03: qty 4

## 2015-10-03 MED ORDER — INSULIN GLARGINE 100 UNIT/ML ~~LOC~~ SOLN
25.0000 [IU] | Freq: Every day | SUBCUTANEOUS | Status: DC
  Administered 2015-10-03 – 2015-10-04 (×2): 25 [IU] via SUBCUTANEOUS
  Filled 2015-10-03 (×5): qty 0.25

## 2015-10-03 MED ORDER — IOPAMIDOL (ISOVUE-300) INJECTION 61%
INTRAVENOUS | Status: AC
  Administered 2015-10-03: 55 mL
  Filled 2015-10-03: qty 100

## 2015-10-03 MED ORDER — HEPARIN (PORCINE) IN NACL 100-0.45 UNIT/ML-% IJ SOLN
1000.0000 [IU]/h | INTRAMUSCULAR | Status: DC
  Administered 2015-10-04 – 2015-10-05 (×2): 1000 [IU]/h via INTRAVENOUS
  Filled 2015-10-03 (×6): qty 250

## 2015-10-03 MED ORDER — SODIUM CHLORIDE 0.9% FLUSH: 3.0000 mL | INTRAVENOUS | Status: DC | PRN

## 2015-10-03 MED ORDER — FENTANYL CITRATE (PF) 100 MCG/2ML IJ SOLN
INTRAMUSCULAR | Status: AC
  Filled 2015-10-03: qty 4

## 2015-10-03 MED ORDER — SODIUM CHLORIDE 0.9 % IV SOLN: 250.0000 mL | INTRAVENOUS | Status: DC | PRN

## 2015-10-03 MED ORDER — FENTANYL CITRATE (PF) 100 MCG/2ML IJ SOLN
INTRAMUSCULAR | Status: AC | PRN
  Administered 2015-10-03: 25 ug via INTRAVENOUS
  Administered 2015-10-03: 50 ug via INTRAVENOUS

## 2015-10-03 MED ORDER — LABETALOL HCL 5 MG/ML IV SOLN
10.0000 mg | INTRAVENOUS | Status: DC | PRN
Start: 2015-10-03 — End: 2015-10-06

## 2015-10-03 MED ORDER — MORPHINE SULFATE (PF) 4 MG/ML IV SOLN
5.0000 mg | INTRAVENOUS | Status: DC | PRN
  Administered 2015-10-03: 5 mg via INTRAVENOUS
  Filled 2015-10-03: qty 2

## 2015-10-03 MED ORDER — LIDOCAINE HCL 1 % IJ SOLN
INTRAMUSCULAR | Status: AC
  Filled 2015-10-03: qty 20

## 2015-10-03 MED ORDER — SODIUM CHLORIDE 0.9 % IV SOLN
1.0000 mg/h | INTRAVENOUS | Status: DC
  Administered 2015-10-04: 1 mg/h via INTRAVENOUS
  Filled 2015-10-03 (×10): qty 10

## 2015-10-03 NOTE — Progress Notes (Signed)
OT Cancellation Note  Patient Details Name: [name] MRN: [id number] DOB: [birth year]   Cancelled Treatment:    Reason Eval/Treat Not Completed: Patient at procedure or test/ unavailable. Pt having blood work done at this time, will check back later  Britt Bottom 10/03/2015, 10:46 AM

## 2015-10-03 NOTE — Progress Notes (Signed)
Physical Therapy Treatment Patient Details Name: [name] MRN: [id number] DOB: Nov 27, 1950 Today's Date: 10/03/2015    History of Present Illness 65 y.o. female with history of obesity, CHF/ICM on home oxygen therapy, coronary artery disease with prior CABG/ICD, diabetes, hypertension, obstructive sleep apnea, hyperlipidemia who was recently admitted to the hospital with three-month history of left leg pain and swelling. Initially patient felt symptoms were related to arthritis. She was seen by her primary care physician yesterday and subsequently ordered a left lower extremity venous Doppler which revealed thrombus in the femoral, profunda, popliteal and greater saphenous veins. For possible venogram and lytic therapy 9/25    PT Comments    Pt performed increased gait and requiring decreased assist for functional mobility.  Pt educated to start walking into the bathroom to increase activity through out the day.  Pt for procedure this pm.    Follow Up Recommendations        Equipment Recommendations  3in1 (PT)    Recommendations for Other Services       Precautions / Restrictions Precautions Precautions: Fall Precaution Comments: Fall risk greatly reduced with use of RW Restrictions Weight Bearing Restrictions: No    Mobility  Bed Mobility Overal bed mobility: Needs Assistance Bed Mobility: Supine to Sit     Supine to sit: Supervision     General bed mobility comments: Used rails  Transfers Overall transfer level: Needs assistance Equipment used: Rolling walker (2 wheeled) Transfers: Sit to/from Stand   Stand pivot transfers: Supervision       General transfer comment: Cues for hand placement  Ambulation/Gait Ambulation/Gait assistance: Min guard Ambulation Distance (Feet): 60 Feet Assistive device: Rolling walker (2 wheeled) Gait Pattern/deviations: Step-to pattern;Step-through pattern;Antalgic;Decreased stance time - left   Gait velocity interpretation:  Below normal speed for age/gender General Gait Details: Cues for gait symmetry and upper trunk control.  Pt required cues for pacing/activity tolerance.     Stairs            Wheelchair Mobility    Modified Rankin (Stroke Patients Only)       Balance                                    Cognition Arousal/Alertness: Awake/alert Behavior During Therapy: WFL for tasks assessed/performed Overall Cognitive Status: Within Functional Limits for tasks assessed                      Exercises      General Comments        Pertinent Vitals/Pain Pain Assessment: 0-10 Pain Score: 7  Pain Location: LLE particularly L knee Pain Descriptors / Indicators: Aching;Grimacing;Guarding Pain Intervention(s): Limited activity within patient's tolerance;Repositioned    Home Living Family/patient expects to be discharged to:: Private residence Living Arrangements: Children Available Help at Discharge: Family;Available PRN/intermittently Type of Home: House Home Access: Stairs to enter Entrance Stairs-Rails: None Home Layout: One level Home Equipment: Environmental consultant - 2 wheels;Walker - 4 wheels;Shower seat      Prior Function Level of Independence: Needs assistance  Gait / Transfers Assistance Needed: with Rollator RW; relatively sedentary ADL's / Homemaking Assistance Needed: Son helps; has a meals on wheels type service     PT Goals (current goals can now be found in the care plan section) Acute Rehab PT Goals Patient Stated Goal: did not state Potential to Achieve Goals: Good Progress towards PT goals: Progressing toward  goals    Frequency    Min 3X/week      PT Plan Current plan remains appropriate    Co-evaluation              End of Session Equipment Utilized During Treatment: Gait belt Activity Tolerance: Patient tolerated treatment well Patient left: in bed;with call bell/phone within reach (refused to sit in recliner chair.  )     Time:  [id number] PT Time Calculation (min) (ACUTE ONLY): 20 min  Charges:  $Therapeutic Activity: 8-22 mins                    G Codes:      Cristela Blue 10/16/2015, 1:55 PM  Governor Rooks, PTA pager [phone]

## 2015-10-03 NOTE — Sedation Documentation (Signed)
Patient is resting comfortably. 

## 2015-10-03 NOTE — Procedures (Signed)
Interventional Radiology Procedure Note  Procedure: LLE venogram and initiation of thrombolysis.  Vascular Access:  1.) Right ATV - 89F sheath 2.) Popliteal vein - 89F sheath  Complications: None  Estimated Blood Loss: None  Recommendations:  - Lyse overnight at 1 mg/hr tPA - Clears only - Labs Q6 hrs per protocol - Follow fibrinogen - Recheck tomorrow afternoon  Signed,  Criselda Peaches, MD

## 2015-10-03 NOTE — Sedation Documentation (Signed)
Patient is resting. Complains of L leg pain

## 2015-10-03 NOTE — Progress Notes (Signed)
Inpatient Diabetes Program Recommendations  AACE/ADA: New Consensus Statement on Inpatient Glycemic Control (2015)  Target Ranges:  Prepandial:   less than 140 mg/dL      Peak postprandial:   less than 180 mg/dL (1-2 hours)      Critically ill patients:  140 - 180 mg/dL   Lab Results  Component Value Date   GLUCAP 205 (H) 10/03/2015   HGBA1C 8.3 (H) 10/01/2015    Review of Glycemic Control  Diabetes history: DM 2 Outpatient Diabetes medications: Glipizide 5 mg Daily, Novolog 50 units Breakfast, 30 units Lunch, 50 units Supper Current orders for Inpatient glycemic control: Lantus 20 units QHS, Novolog Sensitive  TID + HS scale + Novolog 12 units TID meal coverage  Inpatient Diabetes Program Recommendations: Insulin - Basal: Fasting glucose readings over the past 2 days have been over 200 mg/dl. Please consider increasing basal insulin to Lantus 25 units.  Thanks,  Tama Headings RN, MSN, [facility] Inpatient Diabetes Coordinator Team Pager [phone] (8a-5p)

## 2015-10-03 NOTE — Progress Notes (Signed)
[place] for Heparin Indication: DVT  Allergies  Allergen Reactions  . Metformin And Related Itching, Swelling and Other (See Comments)    Leg pain & swelling in legs  . Atorvastatin Itching and Rash  . Levofloxacin Itching    Patient Measurements: Height: 5\' 4"  (162.6 cm) Weight: 251 lb 5.2 oz (114 kg) IBW/kg (Calculated) : 54.7 Heparin Dosing Weight: 82  Vital Signs: Temp: 98.3 F (36.8 C) (09/25 1940) Temp Source: Oral (09/25 1940) BP: 128/46 (09/25 2000) Pulse Rate: 63 (09/25 2000)  Labs:  Recent Labs  10/01/15 0100  10/01/15 2000 10/02/15 0212  10/03/15 0329 10/03/15 1046 10/03/15 1827 10/03/15 1851 10/03/15 1941 10/03/15 2040  HGB 11.4*  --   --  10.4*  --  11.9*  --  11.1*  --   --   --   HCT 34.6*  --   --  33.4*  --  37.1  --  34.9*  --   --   --   PLT 208  --   --  209  --  232  --  229  --   --   --   APTT  --   --   --   --   < > 88* 103*  --  >200*  --  45*  LABPROT  --   --   --   --   --  21.7*  --   --   --   --   --   INR  --   --   --   --   --  1.86  --   --   --   --   --   HEPARINUNFRC 0.51  < >  --  >2.20*  --  >2.20*  --   --   --  >2.20*  --   CREATININE 1.07*  --   --  1.27*  --  1.16*  --   --   --   --   --   TROPONINI  --   --  <0.03  --   --   --   --   --   --   --   --   < > = values in this interval not displayed.  Estimated Creatinine Clearance: 59.8 mL/min (by C-G formula based on SCr of 1.16 mg/dL (H)).   Assessment: 65yo female with extensive DVT on acute treatment dosing of Xarelto over the weekend with last dose 9/24 0830. Now transitioned to heparin gtt and started Ekos this evening. Xarelto affecting heparin level so using PTTs to monitor heparin. Noted heparin rate has been decreased to 800 units/hr per Dr. Laurence Ferrari, and requested to keep anti-Xa level ~ 0.2-0.5    A previous aPTT drawn at 1851 > 200, per RN the blood sample was drawn in the line with hepairn infusion. A repeat  level was dawn after holding heparin for 5 minutes then flush the line, resulted as 45, slightly subtherapeutic. No bleeding noted per RN   Goal of Therapy:  aPTT 66-102 seconds Monitor platelets by anticoagulation protocol: Yes    Plan:  -Increase heparin rate slightly to 950 units/hr -Recheck 6 hr aPTT at 0400   Maryanna Shape, PharmD, BCPS  Clinical Pharmacist  Pager: [phone]   10/03/2015 9:20 PM

## 2015-10-03 NOTE — Care Management Note (Addendum)
Case Management Note  Patient Details  Name: [name] MRN: [id number] Date of Birth: [birth year]  Subjective/Objective:                    Action/Plan:  Patient is active with [facility] and wants to remain with same agency . Referral given to Tim with [facility].  Patient has home oxygen through [place] . Asked patient if she has a portable tank for day of discharge , patient stated yes but " I don't need it , I stay close to here " . Offered NCM can arrange a portable tank to be delivered to hospital room for transportation home. Patient declined stating she won't need it.  Instructed patient if she changed her mind to ask nurse. Will check back with patient.    Patient states she is active with a home health agency but is not sure of name. Her PCP Everardo Beals arranged same. Called Dr Benna Dunks office and left message , awaiting call back.   PT recommending 3 in 1 , patient aware but does not want 3 in 1 at this time. Expected Discharge Date:                  Expected Discharge Plan:  [place]  In-House Referral:     Discharge planning Services  CM Consult  Post Acute Care Choice:  Home Health, Durable Medical Equipment Choice offered to:  Patient  DME Arranged:    DME Agency:     HH Arranged:  RN, PT, OT, Nurse's Aide [place] Agency:     Status of Service:  In process, will continue to follow  If discussed at Long Length of Stay Meetings, dates discussed:    Additional Comments:  Marilu Favre, RN 10/03/2015, 11:15 AM

## 2015-10-03 NOTE — Progress Notes (Signed)
[facility] for Heparin Indication: DVT  Allergies  Allergen Reactions  . Metformin And Related Itching, Swelling and Other (See Comments)    Leg pain & swelling in legs  . Atorvastatin Itching and Rash  . Levofloxacin Itching    Patient Measurements: Height: 5\' 4"  (162.6 cm) Weight: 251 lb 5.2 oz (114 kg) IBW/kg (Calculated) : 54.7 Heparin Dosing Weight: 82  Vital Signs: Temp: 98.3 F (36.8 C) (09/25 0327) Temp Source: Oral (09/25 0327) BP: 121/48 (09/25 0327) Pulse Rate: 63 (09/25 0327)  Labs:  Recent Labs  09/30/15 1912  10/01/15 0100 10/01/15 0811 10/01/15 2000 10/02/15 0212 10/03/15 0329 10/03/15 1046  HGB  --   --  11.4*  --   --  10.4* 11.9*  --   HCT  --   --  34.6*  --   --  33.4* 37.1  --   PLT  --   --  208  --   --  209 232  --   APTT  --   --   --   --   --   --  88* 103*  LABPROT 14.2  --   --   --   --   --  21.7*  --   INR 1.10  --   --   --   --   --  1.86  --   HEPARINUNFRC  --   < > 0.51 0.48  --  >2.20* >2.20*  --   CREATININE  --   --  1.07*  --   --  1.27* 1.16*  --   TROPONINI  --   --   --   --  <0.03  --   --   --   < > = values in this interval not displayed.  Estimated Creatinine Clearance: 59.8 mL/min (by C-G formula based on SCr of 1.16 mg/dL (H)).   Assessment: 65yo female with extensive DVT on acute treatment dosing of Xarelto prior to admission with last dose 9/24 0830. Now transitioned to heparin gtt. Xarelto affecting heparin level so using PTTs to monitor heparin. aptt is slightly supratherapeutic. Pt is expected to undergo vengoraphy this afternoon and possible catheter-directed thrombolysis. CBC stable.   Goal of Therapy:  Heparin level 0.3-0.7 units/ml aPTT 66-102 seconds Monitor platelets by anticoagulation protocol: Yes    Plan:  -Reduce heparin gtt to 1150 units/hr -Daily HL, CBC -Check confirmatory level    Hughes Better, PharmD, BCPS Clinical Pharmacist 10/03/2015 1:16  PM

## 2015-10-03 NOTE — Sedation Documentation (Signed)
Vital signs stable. 

## 2015-10-03 NOTE — Sedation Documentation (Signed)
Family at bedside. 

## 2015-10-03 NOTE — Progress Notes (Signed)
Subjective: Her left leg continues to be extremely painful, though she is hesitant to express her discomfort.  No dyspnea or no chest pain.  Understanding plan for venogram and possible intervention today.  Objective:  Vital signs in last 24 hours: Vitals:   10/02/15 0641 10/02/15 1400 10/02/15 2105 10/03/15 0327  BP: (!) 127/56 (!) 126/59 139/64 (!) 121/48  Pulse: 61 (!) 55 (!) 59 63  Resp: 17 18 18 20   Temp: 98.2 F (36.8 C) 98.4 F (36.9 C) 98.3 F (36.8 C) 98.3 F (36.8 C)  TempSrc:  Oral Oral Oral  SpO2: 98% 100% 100% 98%  Weight: 251 lb 4.8 oz (114 kg)   251 lb 5.2 oz (114 kg)  Height:       Physical Exam  Constitutional: She is oriented to person, place, and time. She appears well-developed and well-nourished. No distress.  Cardiovascular: Normal rate and regular rhythm.   Pulmonary/Chest: Effort normal and breath sounds normal.  Musculoskeletal:  Grossly edematous L leg with 2+ pitting edema to above the knee.  Tender to palpation R leg with trace edema  Neurological: She is alert and oriented to person, place, and time.  Skin: Skin is warm and dry.  Hyperpigmentation of LLE  Psychiatric: She has a normal mood and affect. Her behavior is normal.   CBC Latest Ref Rng & Units 10/03/2015 10/02/2015 10/01/2015  WBC 4.0 - 10.5 K/uL 6.3 5.6 7.1  Hemoglobin 12.0 - 15.0 g/dL 11.9(L) 10.4(L) 11.4(L)  Hematocrit 36.0 - 46.0 % 37.1 33.4(L) 34.6(L)  Platelets 150 - 400 K/uL 232 209 208   BMP Latest Ref Rng & Units 10/03/2015 10/02/2015 10/01/2015  Glucose 65 - 99 mg/dL 205(H) 177(H) 265(H)  BUN 6 - 20 mg/dL 15 15 13   Creatinine 0.44 - 1.00 mg/dL 1.16(H) 1.27(H) 1.07(H)  Sodium 135 - 145 mmol/L 133(L) 137 137  Potassium 3.5 - 5.1 mmol/L 4.4 3.9 3.5  Chloride 101 - 111 mmol/L 99(L) 102 104  CO2 22 - 32 mmol/L 26 26 26   Calcium 8.9 - 10.3 mg/dL 9.7 9.2 9.3   US DVT Bilateral LE (10/02/2015): - Findings consistent with acute deep vein thrombosis involving the   common femoral,  femoral, popliteal, and posterior tibial veins of   the right lower extremity. - Findings consistent with acute deep vein thrombosis involving the   common femoral, femoral, profunda, popliteal, posterior tibial,   and peroneal veins and at the saphenofemoral junction of the left   lower extremity.  Assessment/Plan:  Principal Problem:   DVT (deep venous thrombosis), left Active Problems:   Ischemic cardiomyopathy   CAD (coronary artery disease)   Hypertension   HLD (hyperlipidemia)   Insulin dependent diabetes mellitus (HCC)   S/P CABG x 5   Chronic combined systolic and diastolic CHF, NYHA class 3 (HCC)   Left leg pain  #DVT Proximal DVT of left leg with pain that significantly affects ambulation.  Likely unprovoked, chronic with clot extension over past several months.  No signs or symptoms orf PE.  Started on therapeutic heparin, switched to DOAC, then returned to heparin in expectation of VIR intervention.Marland Kitchen  No change in dyspnea or O2 requirement to suggest PE. -Held heparin -Venography today by VIR -F/u VIR findings, interventions, and recommendations  #DM Taking glipizide 5mg  daily and Novolog 50U/30U/50U with meals at home.  CBGs 200s-300s here.  Would benefit from switching to basal/bolus insulin regimen.  Will start lantus/novolog and assess her insulin requirements. -Lantus 20U QHS -Novolog 10U TID  with SS correction -Diabetes education for pen insulin  #CHF #HTN Stable, euvolemic. -Continue home carvedilol, lasix, imdur, spironolactone, lisinopril  #Deconditioning Has been sedentary due to pain and swelling.  May need rehab. -PT/OT  Dispo: Anticipated discharge in approximately 2 day(s).   Minus Liberty, MD 10/03/2015, 6:55 AM Pager: [phone]

## 2015-10-03 NOTE — Evaluation (Signed)
Occupational Therapy Evaluation Patient Details Name: [name] MRN: [id number] DOB: [birth year] Today's Date: 10/03/2015    History of Present Illness 65 y.o. female with history of obesity, CHF/ICM on home oxygen therapy, coronary artery disease with prior CABG/ICD, diabetes, hypertension, obstructive sleep apnea, hyperlipidemia who was recently admitted to the hospital with three-month history of left leg pain and swelling. Initially patient felt symptoms were related to arthritis. She was seen by her primary care physician yesterday and subsequently ordered a left lower extremity venous Doppler which revealed thrombus in the femoral, profunda, popliteal and greater saphenous veins. For possible venogram and lytic therapy 9/25   Clinical Impression   Pt with decline in function and safety with ADLs and ADL mobility with decreased endurance and balance. Pt is limited by pain in LEs. Pt would benefit from acute OT services to address impairments to increase level of function and safety    Follow Up Recommendations  Supervision - Intermittent    Equipment Recommendations  Tub/shower bench    Recommendations for Other Services       Precautions / Restrictions Precautions Precautions: Fall Precaution Comments: Fall risk greatly reduced with use of RW Restrictions Weight Bearing Restrictions: No      Mobility Bed Mobility Overal bed mobility: Needs Assistance Bed Mobility: Supine to Sit;Sit to Supine     Supine to sit: Supervision Sit to supine: Supervision   General bed mobility comments: Used rails  Transfers Overall transfer level: Needs assistance Equipment used: Rolling walker (2 wheeled) Transfers: Sit to/from [employer] Sit to Stand: Min guard Stand pivot transfers: Min guard       General transfer comment: Cues for hand placement    Balance Overall balance assessment: No apparent balance deficits (not formally assessed)                                           ADL Overall ADL's : Needs assistance/impaired     Grooming: Wash/dry hands;Wash/dry face;Sitting;Set up   Upper Body Bathing: Set up;Sitting   Lower Body Bathing: Minimal assistance   Upper Body Dressing : Set up;Sitting   Lower Body Dressing: Minimal assistance       Toileting- Clothing Manipulation and Hygiene: Min guard;Cueing for safety       Functional mobility during ADLs: Min guard;Cueing for safety General ADL Comments: pt guarded due to LE pain     Vision  wears glasses, no change from baseline              Pertinent Vitals/Pain Pain Assessment: 0-10 Pain Score: 8  Pain Location: LEs Pain Descriptors / Indicators: Aching;Grimacing;Guarding Pain Intervention(s): Limited activity within patient's tolerance;Monitored during session;Repositioned     Hand Dominance Right   Extremity/Trunk Assessment Upper Extremity Assessment Upper Extremity Assessment: Overall WFL for tasks assessed   Lower Extremity Assessment Lower Extremity Assessment: Defer to PT evaluation   Cervical / Trunk Assessment Cervical / Trunk Assessment: Normal   Communication Communication Communication: No difficulties   Cognition Arousal/Alertness: Awake/alert Behavior During Therapy: WFL for tasks assessed/performed Overall Cognitive Status: Within Functional Limits for tasks assessed                     General Comments   pt pleasant and cooperative                 Home Living Family/patient expects  to be discharged to:: Private residence Living Arrangements: Children Available Help at Discharge: Family;Available PRN/intermittently Type of Home: House Home Access: Stairs to enter [employer] of Steps: 3 Entrance Stairs-Rails: None Home Layout: One level     Bathroom Shower/Tub: Teacher, early years/pre: Standard     Home Equipment: Environmental consultant - 2 wheels;Walker - 4 wheels;Shower seat           Prior Functioning/Environment Level of Independence: Needs assistance  Gait / Transfers Assistance Needed: with Rollator RW; relatively sedentary ADL's / Homemaking Assistance Needed: Son helps; has a meals on wheels type service            OT Problem List: Decreased activity tolerance;Decreased knowledge of use of DME or AE;Decreased safety awareness;Pain   OT Treatment/Interventions: Self-care/ADL training;DME and/or AE instruction;Therapeutic activities;Patient/family education    OT Goals(Current goals can be found in the care plan section) Acute Rehab OT Goals Patient Stated Goal: go home OT Goal Formulation: With patient/family Time For Goal Achievement: 10/10/15 Potential to Achieve Goals: Good ADL Goals Pt Will Perform Grooming: with set-up;with supervision;standing Pt Will Perform Upper Body Bathing: with set-up;with modified independence Pt Will Perform Lower Body Bathing: with min guard assist;with supervision;with set-up Pt Will Perform Upper Body Dressing: with set-up;with modified independence Pt Will Perform Lower Body Dressing: with min guard assist;with supervision;with set-up Pt Will Transfer to Toilet: with supervision;with modified independence;regular height toilet;ambulating Pt Will Perform Toileting - Clothing Manipulation and hygiene: with supervision;sitting/lateral leans;sit to/from stand Pt Will Perform Tub/Shower Transfer: tub bench;rolling walker;ambulating;with supervision;with modified independence  OT Frequency: Min 2X/week   Barriers to D/C:    no barriers                     End of Session Equipment Utilized During Treatment: Other (comment);Rolling walker [facility])  Activity Tolerance: Patient limited by pain Patient left: in bed;with call bell/phone within reach;with family/visitor present   Time: [id number] OT Time Calculation (min): 24 min Charges:  OT General Charges $OT Visit: 1 Procedure OT Evaluation $OT Eval Moderate  Complexity: 1 Procedure OT Treatments $Therapeutic Activity: 8-22 mins G-Codes:    Britt Bottom 10/03/2015, 2:32 PM

## 2015-10-03 NOTE — Progress Notes (Signed)
ANTICOAGULATION CONSULT NOTE - Follow-up Consult  Pharmacy Consult for Heparin Indication: DVT  Allergies  Allergen Reactions  . Metformin And Related Itching, Swelling and Other (See Comments)    Leg pain & swelling in legs  . Atorvastatin Itching and Rash  . Levofloxacin Itching    Patient Measurements: Height: 5\' 4"  (162.6 cm) Weight: 251 lb 4.8 oz (114 kg) IBW/kg (Calculated) : 54.7 Heparin Dosing Weight: 82  Vital Signs: Temp: 98.3 F (36.8 C) (09/25 0327) Temp Source: Oral (09/25 0327) BP: 121/48 (09/25 0327) Pulse Rate: 63 (09/25 0327)  Labs:  Recent Labs  09/30/15 1659 09/30/15 1912 10/01/15 0100 10/01/15 0811 10/01/15 2000 10/02/15 0212 10/03/15 0329  HGB 12.9  --  11.4*  --   --  10.4* 11.9*  HCT 39.6  --  34.6*  --   --  33.4* 37.1  PLT 221  --  208  --   --  209 232  APTT  --   --   --   --   --   --  88*  LABPROT  --  14.2  --   --   --   --  21.7*  INR  --  1.10  --   --   --   --  1.86  HEPARINUNFRC  --   --  0.51 0.48  --  >2.20*  --   CREATININE 0.98  --  1.07*  --   --  1.27*  --   TROPONINI  --   --   --   --  <0.03  --   --     Estimated Creatinine Clearance: 54.7 mL/min (by C-G formula based on SCr of 1.27 mg/dL (H)).   Assessment: 65yo female with extensive DVT on acute dosing of Xarelto (last dose 9/24 0830). Now on heparin with plans for lytic therapy to start 9/25. Xarelto affecting heparin level so using PTTs to monitor heparin. PTT 88 sec (therapeutic) on 1250 units/hr. CBC stable.  Goal of Therapy:  Heparin level 0.3-0.7 units/ml aPTT 66-102 seconds Monitor platelets by anticoagulation protocol: Yes   Plan:  Continue heparin 1250 units/hr Check PTT 6hr to confirm therapeutic F/U plans for lytic therapy  Sherlon Handing, PharmD, BCPS Clinical pharmacist, pager [phone] 10/03/2015 3:56 AM

## 2015-10-03 NOTE — Progress Notes (Signed)
Medicine attending: I examined this patient this morning together with resident physician Dr. Minus Liberty and I concur with his management plan which we discussed together. 250 pound woman, BMI 42, hypertensive, insulin-dependent diabetic, coronary artery disease status post bypass surgery, ischemic cardiomyopathy with estimated ejection fraction 40-45 percent and grade 1 diastolic dysfunction,, status post pacemaker implant,  admitted on September 22 with a three-month history of progressive left lower extremity pain and swelling. No chest pain. Initial venous Doppler study done as an outpatient showed left leg proximal DVT. Follow-up Venous Doppler study done here September 24 in addition to confirming extensive DVT involving the common femoral, femoral profunda, popliteal, posterior tibial and peroneal veins on the left also shows acute DVT involving the right common femoral, femoral, popliteal, and posterior tibial veins. No history of prior hysterectomy but no known uterine fibroids. She denies any current vaginal bleeding. A right breast biopsy done March 2016 showed a benign fibroadenoma. Recent mammograms done 09/20/2015 show benign calcifications in the right breast. No suspicious masses. Fine-needle aspirate of a thyroid nodule on 05/28/2014 also benign. Most recent chest x-ray done 05/06/2015 shows presence of a biventricular pacemaker, cardiomegaly, ill-defined opacity right mid and lower lung atelectasis versus early pneumonia. No gross mass or adenopathy. Her baseline hemoglobin was 13 g before she started on anticoagulation with MCV 94 recorded on April 28. Chemistry profile shows mild renal dysfunction with creatinine 1.1. Liver functions are normal. In view of the extensive bilateral nature of the blood clots, she was evaluated by our interventional radiology team. Venography and possible thrombolytic therapy planned for today. She is currently on unfractionated heparin pending this  evaluation. Estimated creatinine clearance in the 50-60 mL/m range so she would be a candidate for a dose adjusted NOAC. Risk factors for her blood clot appeared to be her age, sex, and obesity.

## 2015-10-03 NOTE — Progress Notes (Signed)
eLink Physician-Brief Progress Note Patient Name: [name] DOB: 1950-07-25 MRN: [id number]   Date of Service  10/03/2015  HPI/Events of Note  35 with LE DVT, undergoing directed lysis. To ICU for monitoring,. No Interventions at this time.   eICU Interventions       Intervention Category Evaluation Type: New Patient Evaluation  Saeed Toren S. 10/03/2015, 7:14 PM

## 2015-10-03 NOTE — Progress Notes (Signed)
Inpatient Diabetes Program Recommendations  AACE/ADA: New Consensus Statement on Inpatient Glycemic Control (2015)  Target Ranges:  Prepandial:   less than 140 mg/dL      Peak postprandial:   less than 180 mg/dL (1-2 hours)      Critically ill patients:  140 - 180 mg/dL   Lab Results  Component Value Date   GLUCAP 153 (H) 10/03/2015   HGBA1C 7.8 (H) 10/02/2015   Spoke with patient about her A1c level and the MD's plan for d/cing on insulin. Patient currently uses the vial and syringe method. Educated patient and spouse on insulin pen use at home. Reviewed contents of insulin flexpen starter kit. Reviewed all steps if insulin pen including attachment of needle, 2-unit air shot, dialing up dose, giving injection, removing needle, disposal of sharps, storage of unused insulin, disposal of insulin etc. Patient able to provide successful return demonstration. Also reviewed troubleshooting with insulin pen. MD to give patient Rxs for insulin pens and insulin pen needles.  Insulin pen needle order # [id number]  Thanks,  Tama Headings RN, MSN, [facility] Inpatient Diabetes Coordinator Team Pager [phone] (8a-5p)

## 2015-10-04 ENCOUNTER — Inpatient Hospital Stay (HOSPITAL_COMMUNITY): Payer: Medicare HMO

## 2015-10-04 ENCOUNTER — Encounter (HOSPITAL_COMMUNITY): Payer: Self-pay | Admitting: Interventional Radiology

## 2015-10-04 DIAGNOSIS — I82402 Acute embolism and thrombosis of unspecified deep veins of left lower extremity: Secondary | ICD-10-CM

## 2015-10-04 DIAGNOSIS — R0902 Hypoxemia: Secondary | ICD-10-CM

## 2015-10-04 DIAGNOSIS — I10 Essential (primary) hypertension: Secondary | ICD-10-CM

## 2015-10-04 DIAGNOSIS — J441 Chronic obstructive pulmonary disease with (acute) exacerbation: Secondary | ICD-10-CM

## 2015-10-04 HISTORY — PX: IR GENERIC HISTORICAL: IMG1180011

## 2015-10-04 LAB — CBC
HCT: 33.6 % — ABNORMAL LOW (ref 36.0–46.0)
HEMATOCRIT: 35.3 % — AB (ref 36.0–46.0)
HEMOGLOBIN: 11.3 g/dL — AB (ref 12.0–15.0)
Hemoglobin: 10.8 g/dL — ABNORMAL LOW (ref 12.0–15.0)
MCH: 29.6 pg (ref 26.0–34.0)
MCH: 29.7 pg (ref 26.0–34.0)
MCHC: 32 g/dL (ref 30.0–36.0)
MCHC: 32.1 g/dL (ref 30.0–36.0)
MCV: 92.1 fL (ref 78.0–100.0)
MCV: 92.7 fL (ref 78.0–100.0)
PLATELETS: 189 10*3/uL (ref 150–400)
Platelets: 217 10*3/uL (ref 150–400)
RBC: 3.65 MIL/uL — ABNORMAL LOW (ref 3.87–5.11)
RBC: 3.81 MIL/uL — AB (ref 3.87–5.11)
RDW: 15.2 % (ref 11.5–15.5)
RDW: 15.3 % (ref 11.5–15.5)
WBC: 5.7 10*3/uL (ref 4.0–10.5)
WBC: 5.7 10*3/uL (ref 4.0–10.5)

## 2015-10-04 LAB — BASIC METABOLIC PANEL
Anion gap: 8 (ref 5–15)
BUN: 12 mg/dL (ref 6–20)
CALCIUM: 9.4 mg/dL (ref 8.9–10.3)
CO2: 30 mmol/L (ref 22–32)
CREATININE: 0.91 mg/dL (ref 0.44–1.00)
Chloride: 99 mmol/L — ABNORMAL LOW (ref 101–111)
GFR calc Af Amer: >60 mL/min (ref >60)
GLUCOSE: 157 mg/dL — AB (ref 65–99)
Potassium: 4 mmol/L (ref 3.5–5.1)
SODIUM: 137 mmol/L (ref 135–145)

## 2015-10-04 LAB — GLUCOSE, CAPILLARY
GLUCOSE-CAPILLARY: 240 mg/dL — AB (ref 65–99)
Glucose-Capillary: 156 mg/dL — ABNORMAL HIGH (ref 65–99)
Glucose-Capillary: 232 mg/dL — ABNORMAL HIGH (ref 65–99)
Glucose-Capillary: 259 mg/dL — ABNORMAL HIGH (ref 65–99)

## 2015-10-04 LAB — FIBRINOGEN
FIBRINOGEN: 437 mg/dL (ref 210–475)
FIBRINOGEN: 412 mg/dL (ref 210–475)
FIBRINOGEN: 357 mg/dL (ref 210–475)

## 2015-10-04 LAB — HEPARIN LEVEL (UNFRACTIONATED)
HEPARIN UNFRACTIONATED: 0.45 [IU]/mL (ref 0.30–0.70)
HEPARIN UNFRACTIONATED: 0.62 [IU]/mL (ref 0.30–0.70)
Heparin Unfractionated: 0.55 IU/mL (ref 0.30–0.70)

## 2015-10-04 LAB — APTT: APTT: 36 s (ref 24–36)

## 2015-10-04 LAB — TROPONIN I
Troponin I: <0.03 ng/mL (ref <0.03)
Troponin I: <0.03 ng/mL (ref <0.03)

## 2015-10-04 MED ORDER — IOPAMIDOL (ISOVUE-300) INJECTION 61%
INTRAVENOUS | Status: AC
Start: 2015-10-04 — End: 2015-10-04
  Administered 2015-10-04: 30 mL
  Filled 2015-10-04: qty 100

## 2015-10-04 MED ORDER — PANTOPRAZOLE SODIUM 40 MG PO TBEC
40.0000 mg | DELAYED_RELEASE_TABLET | Freq: Every day | ORAL | Status: DC
  Administered 2015-10-04: 40 mg via ORAL
  Filled 2015-10-04: qty 1

## 2015-10-04 MED ORDER — FENTANYL CITRATE (PF) 100 MCG/2ML IJ SOLN
INTRAMUSCULAR | Status: AC | PRN
  Administered 2015-10-04 (×2): 50 ug via INTRAVENOUS

## 2015-10-04 MED ORDER — LIDOCAINE HCL 1 % IJ SOLN
INTRAMUSCULAR | Status: AC
  Filled 2015-10-04: qty 20

## 2015-10-04 MED ORDER — FENTANYL CITRATE (PF) 100 MCG/2ML IJ SOLN
INTRAMUSCULAR | Status: AC
  Filled 2015-10-04: qty 4

## 2015-10-04 MED ORDER — IOPAMIDOL (ISOVUE-300) INJECTION 61%
INTRAVENOUS | Status: AC
  Administered 2015-10-04: 100 mL
  Filled 2015-10-04: qty 100

## 2015-10-04 NOTE — Consult Note (Signed)
PULMONARY / CRITICAL CARE MEDICINE   Name: [name] MRN: [id number] DOB: Aug 20, 1950    ADMISSION DATE:  09/30/2015 CONSULTATION DATE:  9/26  REFERRING MD:  Graciella Freer  CHIEF COMPLAINT: left leg pain + SOB x 3 months  HISTORY OF PRESENT ILLNESS:   65 yo MO(256 lbs) never smoker, 3/28 breast bx with non malignant HYALINIZED FIBROADENOMA, IDDM > 5years, CAD/CHF with G1DD ef 45%, who noted 3 months of left leg pain and SOB. She was seen by PCP and given steroid injection to left knee. PCP preformed LEDS and LLE dvt was noted. She was admitted 9/22 and treated with heparin and Xarelto. Her left lower ext was increasing in size and pain and the decision was made to start catheter directed lysis of left lower ext via tibial/popliteal veins at 1745. She was moved to ICU and PCCM asked to assist in her care.  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  PAST SURGICAL HISTORY: She  has a past surgical history that includes Cardiac catheterization; Cardiac defibrillator placement (4/08); Coronary artery bypass graft (10/05/2005); Implantable cardioverter defibrillator implant (09/25/12); bi-ventricular implantable cardioverter defibrillator upgrade (N/A, 09/25/2012); ir generic historical (10/03/2015); ir generic historical (10/03/2015); ir generic historical (10/03/2015); ir generic historical (10/03/2015); and ir generic historical (10/03/2015).  Allergies  Allergen Reactions  . Metformin And Related Itching,  Swelling and Other (See Comments)    Leg pain & swelling in legs  . Atorvastatin Itching and Rash  . Levofloxacin Itching    No current facility-administered medications on file prior to encounter.    Current Outpatient Prescriptions on File Prior to Encounter  Medication Sig  . albuterol (PROVENTIL HFA;VENTOLIN HFA) 108 (90 BASE) MCG/ACT inhaler Inhale 2 puffs into the lungs every 6 (six) hours as needed for wheezing or shortness of breath.  Marland Kitchen aspirin EC 81 MG EC tablet Take 1 tablet (81 mg total) by mouth daily.  . carvedilol (COREG) 12.5 MG tablet Take 12.5 mg by mouth 2 (two) times daily with a meal.   . Choline Fenofibrate (FENOFIBRIC ACID) 135 MG CPDR Take 135 mg by mouth daily.  . fluconazole (DIFLUCAN) 150 MG tablet Take 150 mg by mouth daily.   . furosemide (LASIX) 80 MG tablet Take 1 tablet (80 mg total) by mouth 2 (two) times daily.  Marland Kitchen glipiZIDE (GLUCOTROL) 5 MG tablet Take 5 mg by mouth daily before breakfast.   . IRON PO Take 1 tablet by mouth daily.  . isosorbide mononitrate (IMDUR) 30 MG 24 hr tablet Take 1 tablet (30 mg total) by mouth daily.  Marland Kitchen ketoconazole (NIZORAL) 2 % cream Apply 1 application topically 2 (two) times daily.   Marland Kitchen latanoprost (XALATAN) 0.005 % ophthalmic solution Place 1 drop into both eyes at bedtime.  Marland Kitchen lisinopril (PRINIVIL,ZESTRIL) 10 MG tablet Take 10 mg by mouth daily.   . meloxicam (MOBIC) 7.5 MG tablet Take 7.5 mg by mouth daily.  . methocarbamol (ROBAXIN) 500 MG tablet Take 1 tablet (500 mg total) by mouth every 8 (eight) hours as needed for muscle spasms.  . nitroGLYCERIN (NITROSTAT) 0.4 MG SL tablet Place 1 tablet (  0.4 mg total) under the tongue every 5 (five) minutes as needed. (Patient taking differently: Place 0.4 mg under the tongue every 5 (five) minutes as needed for chest pain. )  . potassium phosphate, monobasic, (K-PHOS ORIGINAL) 500 MG tablet Take 500 mg by mouth daily as needed (takes when needed for spasms).   Marland Kitchen spironolactone  (ALDACTONE) 25 MG tablet Take 0.5 tablets (12.5 mg total) by mouth daily.    FAMILY HISTORY:  Her @FAMSTP (<SUBSCRIPT> error)@  SOCIAL HISTORY: She  reports that she has never smoked. She has never used smokeless tobacco. She reports that she does not drink alcohol or use drugs.  REVIEW OF SYSTEMS:   10 point review of system taken, please see HPI for positives and negatives.   SUBJECTIVE:  MOF  VITAL SIGNS: BP (!) 129/49   Pulse 62   Temp 99 F (37.2 C) (Oral)   Resp 20   Ht 5\' 4"  (1.626 m)   Wt 256 lb 9.9 oz (116.4 kg)   SpO2 100%   BMI 44.05 kg/m   HEMODYNAMICS:    VENTILATOR SETTINGS:    INTAKE / OUTPUT: I/O last 3 completed shifts: In: 930.5 [I.V.:930.5] Out: 1675 [Urine:1675]  PHYSICAL EXAMINATION: General:  MOAAFNAD Neuro: Intact HEENT:  No neck Cardiovascular:  HSR RRR Lungs:  CTA, decreased in bases Abdomen:  Obese +bs Musculoskeletal: Left le >right edema, LLE tibial cath noted Skin:  Warm and dry  LABS:  BMET  Recent Labs Lab 10/02/15 0212 10/03/15 0329 10/04/15 0500  NA 137 133* 137  K 3.9 4.4 4.0  CL 102 99* 99*  CO2 26 26 30   BUN 15 15 12   CREATININE 1.27* 1.16* 0.91  GLUCOSE 177* 205* 157*    Electrolytes  Recent Labs Lab 10/02/15 0212 10/03/15 0329 10/04/15 0500  CALCIUM 9.2 9.7 9.4    CBC  Recent Labs Lab 10/03/15 0329 10/03/15 1827 10/04/15 0030  WBC 6.3 5.6 5.7  HGB 11.9* 11.1* 11.3*  HCT 37.1 34.9* 35.3*  PLT 232 229 217    Coag's  Recent Labs Lab 09/30/15 1912 10/03/15 0329  10/03/15 1851 10/03/15 2040 10/04/15 0500  APTT  --  88*  < > >200* 45* 36  INR 1.10 1.86  --   --   --   --   < > = values in this interval not displayed.  Sepsis Markers No results for input(s): LATICACIDVEN, PROCALCITON, O2SATVEN in the last 168 hours.  ABG No results for input(s): PHART, PCO2ART, PO2ART in the last 168 hours.  Liver Enzymes  Recent Labs Lab 09/30/15 1659 10/03/15 0329  AST 19 20  ALT 15 16   ALKPHOS 78 62  BILITOT 0.6 0.5  ALBUMIN 3.7 3.4*    Cardiac Enzymes  Recent Labs Lab 10/01/15 2000  TROPONINI <0.03    Glucose  Recent Labs Lab 10/02/15 1659 10/02/15 2201 10/03/15 0834 10/03/15 1259 10/03/15 1821 10/03/15 2230  GLUCAP 180* 171* 205* 153* 132* 177*    Imaging Ir Veno/ext/uni Left  Result Date: 10/03/2015 INDICATION: 65 year old female with a 3 month history of left lower extremity pain and swelling. She was recently diagnosed with extensive bilateral lower extremity DVT. However, only the left lower extremity is symptomatic. She has not experienced relief despite several days of heparinization. She presents for catheter directed thrombolysis. EXAM: IR INFUSION THROMBOL VENOUS INITIAL (MS); IR ULTRASOUND GUIDANCE VASC ACCESS LEFT; LEFT EXTREMITY VENOGRAPHY; INFERIOR VENA CAVOGRAM 1. Ultrasound-guided access anterior tibial vein 2. Left lower extremity venogram 3. Ultrasound-guided access  popliteal vein 4. Inferior vena cavagram 5. Initiation of venous thrombolysis COMPARISON:  None. MEDICATIONS: None. ANESTHESIA/SEDATION: Versed 1.5 mg IV; Fentanyl 75 mcg IV Moderate Sedation Time:  42 minutes The patient was continuously monitored during the procedure by the interventional radiology nurse under my direct supervision. FLUOROSCOPY TIME:  Fluoroscopy Time: 8 minutes 42 seconds (269 mGy). COMPLICATIONS: None immediate. TECHNIQUE: Informed written consent was obtained from the patient after a thorough discussion of the procedural risks, benefits and alternatives. All questions were addressed. Maximal Sterile Barrier Technique was utilized including caps, mask, sterile gowns, sterile gloves, sterile drape, hand hygiene and skin antiseptic. A timeout was performed prior to the initiation of the procedure. Ultrasound was used to interrogate the popliteal fossa. The popliteal vein appears completely thrombosed by ultrasound and is noncompressible. Ultrasound was used to  interrogate the posterior tibial veins. The posterior tibial veins could not be identified and may be chronically occluded. Ultrasound was used to interrogate the anterior tibial vein. At least 1 of the paired anterior tibial veins is widely patent. Local anesthesia was attained by infiltration with 1% lidocaine. Under real-time sonographic guidance, the anterior tibial vein was punctured with a 21 gauge micropuncture needle. Using standard technique, the needle was exchanged over a micro wire for a 5 [place] transitional micro sheath which was then exchanged over a short Amplatz wire for a short 6 [place] vascular sheath. Venography was performed through the 6 French sheath. The anterior tibial vein is patent. There is either a patent channel through the popliteal vein, or a venous collateral running parallel with the popliteal vein which is patent. A wire was advanced through the popliteal vein. Attention was again directed to the popliteal fossa. Local anesthesia was attained by infiltration with 1% lidocaine. Under real-time sonographic guidance, the popliteal vein was punctured with a 21 gauge micropuncture needle. Using standard technique, the needle was exchanged over a micro wire for a 5 [place] transitional micro sheath. The nerve she was performed. There appears to be a patent channel in the popliteal vein with chronic thrombus. Additional venography was performed of the right lower extremity. There are numerous collaterals throughout the right lower extremity. 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 Using a Glidewire and angled catheter the catheter was successfully advanced to the level of the lesser trochanter. Additional venography was performed. There is acute appearing thrombus in the femoral vein in the proximal thigh and common femoral vein at the femoral head. Thrombus extends into the profunda femoral vein. The catheter was successfully advanced into the iliac  vein. 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 With some difficulty, the wire was eventually advanced beyond this occlusion and into the inferior vena cava. An inferior vena cavagram was performed. The inferior vena cava is patent. Inflow from the left iliac vein is not visualized. The catheter was brought back across the stenosis and venography performed confirming iliac vein compression with likely some chronic thrombus. A Rosen wire was advanced into the inferior vena cava. The 5 French angled catheter was removed. A 135 cm total length, 50 cm infusion length ultrasound assisted infusion catheter was advanced over the Rosen wire and positioned from the region of iliac vein compression into the femoral vein in the distal thigh. Thrombolysis was then initiated with tPA at 1 milligram/hour. FINDINGS: 1. 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 Acute thrombus within the femoral vein in the proximal thigh and the common femoral vein 4. 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 Lysis check tomorrow. Patient will likely require stent placement for iliac vein compression. Signed, Criselda Peaches, MD [facility] Radiology Electronically Signed   By: Jacqulynn Cadet M.D.   On: 10/03/2015 18:08   Ir Mariana Arn Ivc  Result Date: 10/03/2015 INDICATION: 65 year old female with a 3 month history of left lower extremity pain and swelling. She was recently  diagnosed with extensive bilateral lower extremity DVT. However, only the left lower extremity is symptomatic. She has not experienced relief despite several days of heparinization. She presents for catheter directed thrombolysis. EXAM: IR INFUSION THROMBOL VENOUS INITIAL (MS); IR ULTRASOUND GUIDANCE VASC ACCESS LEFT; LEFT EXTREMITY VENOGRAPHY; INFERIOR VENA CAVOGRAM 1. Ultrasound-guided access anterior tibial vein 2. Left lower extremity venogram 3. Ultrasound-guided access popliteal vein 4. Inferior vena cavagram 5. Initiation of venous thrombolysis COMPARISON:  None. MEDICATIONS: None. ANESTHESIA/SEDATION: Versed 1.5 mg IV; Fentanyl 75 mcg IV Moderate Sedation Time:  42 minutes The patient was continuously monitored during the procedure by the interventional radiology nurse under my direct supervision. FLUOROSCOPY TIME:  Fluoroscopy Time: 8 minutes 42 seconds (269 mGy). COMPLICATIONS: None immediate. TECHNIQUE: Informed written consent was obtained from the patient after a thorough discussion of the procedural risks, benefits and alternatives. All questions were addressed. Maximal Sterile Barrier Technique was utilized including caps, mask, sterile gowns, sterile gloves, sterile drape, hand hygiene and skin antiseptic. A timeout was performed prior to the initiation of the procedure. Ultrasound was used to interrogate the popliteal fossa. The popliteal vein appears completely thrombosed by ultrasound and is noncompressible. Ultrasound was used to interrogate the posterior tibial veins. The posterior tibial veins could not be identified and may be chronically occluded. Ultrasound was used to interrogate the anterior tibial vein. At least 1 of the paired anterior tibial veins is widely patent. Local anesthesia was attained by infiltration with 1% lidocaine. Under real-time sonographic guidance, the anterior tibial vein was punctured with a 21 gauge micropuncture needle. Using standard technique, the needle was  exchanged over a micro wire for a 5 [place] transitional micro sheath which was then exchanged over a short Amplatz wire for a short 6 [place] vascular sheath. Venography was performed through the 6 French sheath. The anterior tibial vein is patent. There is either a patent channel through the popliteal vein, or a venous collateral running parallel with the popliteal vein which is patent. A wire was advanced through the popliteal vein. Attention was again directed to the popliteal fossa. Local anesthesia was attained by infiltration with 1% lidocaine. Under real-time sonographic guidance, the popliteal vein was punctured with a 21 gauge micropuncture needle. Using standard technique, the needle was exchanged over a micro wire for a 5 [place] transitional micro sheath. The nerve she was performed. There appears to be a patent channel in the popliteal vein with chronic thrombus. Additional venography was performed of the right lower extremity. There are numerous collaterals throughout the right lower extremity. 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 Using a Glidewire and  angled catheter the catheter was successfully advanced to the level of the lesser trochanter. Additional venography was performed. There is acute appearing thrombus in the femoral vein in the proximal thigh and common femoral vein at the femoral head. Thrombus extends into the profunda femoral vein. The catheter was successfully advanced into the iliac vein. 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 With some difficulty, the wire was eventually advanced beyond this occlusion and into the inferior vena cava. An inferior vena cavagram was performed. The inferior vena cava is patent. Inflow from the left iliac vein is not visualized. The catheter was  brought back across the stenosis and venography performed confirming iliac vein compression with likely some chronic thrombus. A Rosen wire was advanced into the inferior vena cava. The 5 French angled catheter was removed. A 135 cm total length, 50 cm infusion length ultrasound assisted infusion catheter was advanced over the Rosen wire and positioned from the region of iliac vein compression into the femoral vein in the distal thigh. Thrombolysis was then initiated with tPA at 1 milligram/hour. FINDINGS: 1. 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 Acute thrombus within the femoral vein in the proximal thigh and the common femoral vein 4. 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 Lysis check tomorrow. Patient will likely require stent placement for iliac vein compression. Signed, Criselda Peaches, MD [facility] Radiology Electronically Signed   By: Jacqulynn Cadet M.D.   On: 10/03/2015 18:08   Ir US Guide Vasc Access Left  Result Date: 10/03/2015 INDICATION: 65 year old female with a 3 month history of left lower extremity pain and swelling. She was recently diagnosed with extensive bilateral lower extremity DVT. However, only the left lower extremity is symptomatic. She has not experienced relief despite several days of heparinization. She presents for catheter directed thrombolysis. EXAM: IR INFUSION THROMBOL VENOUS INITIAL (MS); IR ULTRASOUND GUIDANCE VASC ACCESS LEFT; LEFT EXTREMITY VENOGRAPHY; INFERIOR VENA CAVOGRAM 1. Ultrasound-guided access anterior tibial vein 2. Left lower extremity venogram 3.  Ultrasound-guided access popliteal vein 4. Inferior vena cavagram 5. Initiation of venous thrombolysis COMPARISON:  None. MEDICATIONS: None. ANESTHESIA/SEDATION: Versed 1.5 mg IV; Fentanyl 75 mcg IV Moderate Sedation Time:  42 minutes The patient was continuously monitored during the procedure by the interventional radiology nurse under my direct supervision. FLUOROSCOPY TIME:  Fluoroscopy Time: 8 minutes 42 seconds (269 mGy). COMPLICATIONS: None immediate. TECHNIQUE: Informed written consent was obtained from the patient after a thorough discussion of the procedural risks, benefits and alternatives. All questions were addressed. Maximal Sterile Barrier Technique was utilized including caps, mask, sterile gowns, sterile gloves, sterile drape, hand hygiene and skin antiseptic. A timeout was performed prior to the initiation of the procedure. Ultrasound was used to interrogate the popliteal fossa. The popliteal vein appears completely thrombosed by ultrasound and is noncompressible. Ultrasound was used to interrogate the posterior tibial veins. The posterior tibial veins could not be identified and may be chronically occluded. Ultrasound was used to interrogate the anterior tibial vein. At least 1 of the paired anterior tibial  veins is widely patent. Local anesthesia was attained by infiltration with 1% lidocaine. Under real-time sonographic guidance, the anterior tibial vein was punctured with a 21 gauge micropuncture needle. Using standard technique, the needle was exchanged over a micro wire for a 5 [place] transitional micro sheath which was then exchanged over a short Amplatz wire for a short 6 [place] vascular sheath. Venography was performed through the 6 French sheath. The anterior tibial vein is patent. There is either a patent channel through the popliteal vein, or a venous collateral running parallel with the popliteal vein which is patent. A wire was advanced through the popliteal vein. Attention was again  directed to the popliteal fossa. Local anesthesia was attained by infiltration with 1% lidocaine. Under real-time sonographic guidance, the popliteal vein was punctured with a 21 gauge micropuncture needle. Using standard technique, the needle was exchanged over a micro wire for a 5 [place] transitional micro sheath. The nerve she was performed. There appears to be a patent channel in the popliteal vein with chronic thrombus. Additional venography was performed of the right lower extremity. There are numerous collaterals throughout the right lower extremity. 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 Using a Glidewire and angled catheter the catheter was successfully advanced to the level of the lesser trochanter. Additional venography was performed. There is acute appearing thrombus in the femoral vein in the proximal thigh and common femoral vein at the femoral head. Thrombus extends into the profunda femoral vein. The catheter was successfully advanced into the iliac vein. 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 With some difficulty, the wire was eventually advanced beyond this occlusion and into the inferior vena cava. An inferior vena cavagram was performed. The inferior vena cava is patent. Inflow from the left iliac vein is not visualized. The catheter was brought back across the stenosis and venography performed confirming iliac vein compression with likely some chronic thrombus. A Rosen wire was advanced into the inferior vena cava. The 5 French angled catheter was removed. A 135 cm total length, 50 cm infusion length ultrasound assisted infusion catheter was advanced over the Rosen wire and positioned from the region of iliac vein compression into the femoral vein in the distal thigh. Thrombolysis was then  initiated with tPA at 1 milligram/hour. FINDINGS: 1. 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 Acute thrombus within the femoral vein in the proximal thigh and the common femoral vein 4. 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 Lysis check tomorrow. Patient will likely require stent placement for iliac vein compression. Signed, Criselda Peaches, MD [facility] Radiology Electronically Signed   By: Jacqulynn Cadet M.D.   On: 10/03/2015 18:08   Ir US Guide Vasc Access Left  Result Date: 10/03/2015 INDICATION: 65 year old female with a 3 month history of left lower extremity pain and swelling. She was recently diagnosed with extensive bilateral lower extremity DVT. However, only the left lower extremity is symptomatic. She has not experienced relief despite several days of heparinization. She  presents for catheter directed thrombolysis. EXAM: IR INFUSION THROMBOL VENOUS INITIAL (MS); IR ULTRASOUND GUIDANCE VASC ACCESS LEFT; LEFT EXTREMITY VENOGRAPHY; INFERIOR VENA CAVOGRAM 1. Ultrasound-guided access anterior tibial vein 2. Left lower extremity venogram 3. Ultrasound-guided access popliteal vein 4. Inferior vena cavagram 5. Initiation of venous thrombolysis COMPARISON:  None. MEDICATIONS: None. ANESTHESIA/SEDATION: Versed 1.5 mg IV; Fentanyl 75 mcg IV Moderate Sedation Time:  42 minutes The patient was continuously monitored during the procedure by the interventional radiology nurse under my direct supervision. FLUOROSCOPY TIME:  Fluoroscopy Time: 8 minutes 42 seconds (269 mGy). COMPLICATIONS: None immediate.  TECHNIQUE: Informed written consent was obtained from the patient after a thorough discussion of the procedural risks, benefits and alternatives. All questions were addressed. Maximal Sterile Barrier Technique was utilized including caps, mask, sterile gowns, sterile gloves, sterile drape, hand hygiene and skin antiseptic. A timeout was performed prior to the initiation of the procedure. Ultrasound was used to interrogate the popliteal fossa. The popliteal vein appears completely thrombosed by ultrasound and is noncompressible. Ultrasound was used to interrogate the posterior tibial veins. The posterior tibial veins could not be identified and may be chronically occluded. Ultrasound was used to interrogate the anterior tibial vein. At least 1 of the paired anterior tibial veins is widely patent. Local anesthesia was attained by infiltration with 1% lidocaine. Under real-time sonographic guidance, the anterior tibial vein was punctured with a 21 gauge micropuncture needle. Using standard technique, the needle was exchanged over a micro wire for a 5 [place] transitional micro sheath which was then exchanged over a short Amplatz wire for a short 6 [place] vascular sheath. Venography was performed through the 6 French sheath. The anterior tibial vein is patent. There is either a patent channel through the popliteal vein, or a venous collateral running parallel with the popliteal vein which is patent. A wire was advanced through the popliteal vein. Attention was again directed to the popliteal fossa. Local anesthesia was attained by infiltration with 1% lidocaine. Under real-time sonographic guidance, the popliteal vein was punctured with a 21 gauge micropuncture needle. Using standard technique, the needle was exchanged over a micro wire for a 5 [place] transitional micro sheath. The nerve she was performed. There appears to be a patent channel in the popliteal vein with chronic thrombus. Additional venography was  performed of the right lower extremity. There are numerous collaterals throughout the right lower extremity. 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 Using a Glidewire and angled catheter the catheter was successfully advanced to the level of the lesser trochanter. Additional venography was performed. There is acute appearing thrombus in the femoral vein in the proximal thigh and common femoral vein at the femoral head. Thrombus extends into the profunda femoral vein. The catheter was successfully advanced into the iliac vein. 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 With some difficulty, the wire was eventually advanced beyond this occlusion and into the inferior vena cava. An inferior vena cavagram was performed. The inferior vena cava is patent. Inflow from the left iliac vein is not visualized. The catheter was brought back across the stenosis and venography performed confirming iliac vein compression with likely some chronic thrombus. A Rosen wire was advanced into the inferior vena cava. The 5 French angled catheter was removed. A 135 cm total length, 50 cm infusion length ultrasound assisted infusion catheter  was advanced over the [facility] wire and positioned from the region of iliac vein compression into the femoral vein in the distal thigh. Thrombolysis was then initiated with tPA at 1 milligram/hour. FINDINGS: 1. 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 Acute thrombus within the femoral vein in the proximal thigh and the common femoral vein 4. 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 Lysis check tomorrow. Patient will likely require stent placement for iliac vein compression. Signed, Criselda Peaches, MD [facility] Radiology Electronically Signed   By: Jacqulynn Cadet M.D.   On: 10/03/2015 18:08   Ir Infusion Thrombol Venous Initial (ms)  Result Date: 10/03/2015 INDICATION: 65 year old female with a 3 month history of left lower extremity pain and swelling. She was recently diagnosed with extensive bilateral lower extremity DVT. However, only the left lower extremity is symptomatic. She has not experienced relief despite several days of heparinization. She presents for catheter directed thrombolysis. EXAM: IR INFUSION THROMBOL VENOUS INITIAL (MS); IR ULTRASOUND GUIDANCE VASC ACCESS LEFT; LEFT EXTREMITY VENOGRAPHY; INFERIOR VENA CAVOGRAM 1. Ultrasound-guided access anterior tibial vein 2. Left lower extremity venogram 3. Ultrasound-guided access popliteal vein 4. Inferior vena cavagram 5. Initiation of venous thrombolysis COMPARISON:  None. MEDICATIONS: None. ANESTHESIA/SEDATION: Versed 1.5 mg IV; Fentanyl 75 mcg IV Moderate Sedation Time:  42 minutes The patient was continuously monitored during the procedure by the interventional radiology nurse under my direct supervision. FLUOROSCOPY TIME:  Fluoroscopy Time: 8 minutes 42 seconds (269 mGy). COMPLICATIONS: None immediate. TECHNIQUE: Informed written consent was obtained from the patient after a thorough discussion of the procedural risks, benefits and alternatives. All questions were addressed. Maximal Sterile Barrier Technique was utilized including caps, mask, sterile gowns, sterile gloves, sterile drape, hand hygiene and skin antiseptic. A timeout was performed prior to the initiation of the procedure. Ultrasound was used to interrogate the popliteal  fossa. The popliteal vein appears completely thrombosed by ultrasound and is noncompressible. Ultrasound was used to interrogate the posterior tibial veins. The posterior tibial veins could not be identified and may be chronically occluded. Ultrasound was used to interrogate the anterior tibial vein. At least 1 of the paired anterior tibial veins is widely patent. Local anesthesia was attained by infiltration with 1% lidocaine. Under real-time sonographic guidance, the anterior tibial vein was punctured with a 21 gauge micropuncture needle. Using standard technique, the needle was exchanged over a micro wire for a 5 [place] transitional micro sheath which was then exchanged over a short Amplatz wire for a short 6 [place] vascular sheath. Venography was performed through the 6 French sheath. The anterior tibial vein is patent. There is either a patent channel through the popliteal vein, or a venous collateral running parallel with the popliteal vein which is patent. A wire was advanced through the popliteal vein. Attention was again directed to the popliteal fossa. Local anesthesia was attained by infiltration with 1% lidocaine. Under real-time sonographic guidance, the popliteal vein was punctured with a 21 gauge micropuncture needle. Using standard technique, the needle was exchanged over a micro wire for a 5 [place] transitional micro sheath. The nerve she was performed. There appears to be a patent channel in the  popliteal vein with chronic thrombus. Additional venography was performed of the right lower extremity. There are numerous collaterals throughout the right lower extremity. 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 Using a Glidewire and angled catheter the catheter was successfully advanced to the level of the lesser trochanter. Additional venography was performed. There is acute appearing thrombus in the femoral vein in the proximal thigh and common femoral vein at the  femoral head. Thrombus extends into the profunda femoral vein. The catheter was successfully advanced into the iliac vein. 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 With some difficulty, the wire was eventually advanced beyond this occlusion and into the inferior vena cava. An inferior vena cavagram was performed. The inferior vena cava is patent. Inflow from the left iliac vein is not visualized. The catheter was brought back across the stenosis and venography performed confirming iliac vein compression with likely some chronic thrombus. A Rosen wire was advanced into the inferior vena cava. The 5 French angled catheter was removed. A 135 cm total length, 50 cm infusion length ultrasound assisted infusion catheter was advanced over the Rosen wire and positioned from the region of iliac vein compression into the femoral vein in the distal thigh. Thrombolysis was then initiated with tPA at 1 milligram/hour. FINDINGS: 1. 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 Acute thrombus within the femoral vein in the proximal thigh and the common femoral vein 4. 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 Lysis check tomorrow. Patient will likely require stent placement for iliac vein compression. Signed, Criselda Peaches, MD [facility] Radiology Electronically Signed   By: Jacqulynn Cadet M.D.   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 She was seen by PCP and given steroid injection to left knee. PCP preformed LEDS and LLE dvt was noted. She was admitted 9/22 and treated with heparin and Xarelto. Her left lower ext was increasing in size and pain and the decision was made to start catheter directed lysis of left lower ext via tibial/popliteal veins at 1745. She was moved to ICU and PCCM asked to assist in her care.  ASSESSMENT / PLAN:  PULMONARY A: MO non smoker P:   Follow sats and pulmonary function Note bilateral LE DVT. NO CT/or VQ chest noted.   CARDIOVASCULAR A:  CHF with G1DD EF 45% CAD post CABG Hyperlipidemia  LBBB Extensive left> rt lower ext DVT with EKOS started 9/25 P:  Careful with diuretics LBBB may mask acute cardiac event, Trop 1 negative 9/23. Repeat trop, she is already on TPA and heparin  RENAL Lab Results  Component Value Date   CREATININE 0.91 10/04/2015   CREATININE 1.16 (H) 10/03/2015  CREATININE 1.27 (H) 10/02/2015   CREATININE 0.88 09/20/2015   CREATININE 1.11 (H) 05/19/2015   CREATININE 1.10 (H) 05/06/2015    Recent Labs Lab 10/02/15 0212 10/03/15 0329 10/04/15 0500  K 3.9 4.4 4.0     A:   Renal Insuff and on diuretics On aldactone P:   Follow creatine Monitor K+ for hyperkalemia  GASTROINTESTINAL A:   Gi protection P:   Oral PPI will be started 9/26  HEMATOLOGIC A:   Anticogulation P:  Follow CBC/INR/Fibrogen   INFECTIOUS A:   No overt infections P:   Left lower ext with poor blood supply and now with catheter in place. Monitor for infection  ENDOCRINE A:   IDDM  H 1c 7.8 P:   SSI  NEUROLOGIC A:   Cognitive functions intact.  Chronic left lower  ext pain(see cards for DVT info) P:   RASS goal: 1 Pain meds as needed   FAMILY  - Updates:   - Inter-disciplinary family meet or Palliative Care meeting due by:  10/3    Richardson Landry Minor ACNP Maryanna Shape PCCM Pager [phone] till 3 pm If no answer page [phone] 10/04/2015, 8:07 AM  Attending Note:  65 year old female with PMH of extensive cardiac disease who presents to the hospital with lower ext DVT that appeared chronic originally but then decision was made to proceed with EKOS by IR.  Patient was transferred to the ICU for EKOS.  PCCM took over care.  Hx of COPD as well.  On exam, lungs with bibasilar crackles and L>R pedal edema.  I reviewed lower ext doppler myself, lack of compression noted.  Discussed with PCCM-NP.  Will keep patient in the ICU for observation with EKOS given high bleeding risk so needs ICU monitoring.  DVT:  - EKOS  - Heparin  - Will need chronic anti-coagulation.  COPD:  - BD as ordered.  - IS per RT protocol.  - No indication for steroids at this time.  Hypoxemia:  - Titrate O2 for sat of 88-92%.  - Will need an ambulatory desaturation study prior to discharge to determine level of home O2.  DM:  - Lantus.  - ISS  - CBG  HTN:  - Lisinopril  - Carvedilol.  Patient seen and examined, agree with above note.  I dictated the care and orders written for this patient under my direction.  Rush Farmer, MD [phone]

## 2015-10-04 NOTE — Progress Notes (Signed)
Upon assessment, RN noticed MASD underneath left breast that was beginning to cause skin break down and some bleeding.  RN placed pillow case underneath breasts and put in for a WOC consult for further follow up and treatment.

## 2015-10-04 NOTE — Procedures (Signed)
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 No immediate post procedural complications. Access: L popliteal vein and L DP vein -> hemostasis achieved with manual compression. Keep L leg flat for 2 hrs.  Ronny Bacon, MD Pager #: [phone]

## 2015-10-04 NOTE — Consult Note (Addendum)
WOC assistance requested for use of Interdry. Skin folds beneath breasts are red, macerated, and moist with partial thickness skin loss in fissure areas, which have small amt bleeding.  Appearance is consistent with intertrigo.  Interdry silver-impregnated fabric ordered for use by bedside nurses and instructions provided.  This product should remain in place for 5 days for optimal plan of care to provide antimicrobial benefits and wick moisture away from skin.  Please re-consult if further assistance is needed.  Thank-you,  Julien Girt MSN, [place], [place], [place], [place]

## 2015-10-04 NOTE — Progress Notes (Signed)
ANTICOAGULATION CONSULT NOTE - Follow Up Consult  Pharmacy Consult for heparin Indication: DVT  Allergies  Allergen Reactions  . Metformin And Related Itching, Swelling and Other (See Comments)    Leg pain & swelling in legs  . Atorvastatin Itching and Rash  . Levofloxacin Itching    Patient Measurements: Height: 5\' 4"  (162.6 cm) Weight: 256 lb 9.9 oz (116.4 kg) IBW/kg (Calculated) : 54.7 Heparin Dosing Weight: 81.3 kg  Vital Signs: Temp: 98 F (36.7 C) (09/26 1945) Temp Source: Oral (09/26 1945) BP: 145/68 (09/26 2000) Pulse Rate: 59 (09/26 2000)  Labs:  Recent Labs  10/02/15 0212 10/03/15 0329  10/03/15 1827 10/03/15 1851  10/03/15 2040 10/04/15 0030 10/04/15 0500 10/04/15 0818 10/04/15 0830 10/04/15 1200 10/04/15 1753 10/04/15 1930  HGB 10.4* 11.9*  --  11.1*  --   --   --  11.3*  --   --   --  10.8*  --   --   HCT 33.4* 37.1  --  34.9*  --   --   --  35.3*  --   --   --  33.6*  --   --   PLT 209 232  --  229  --   --   --  217  --   --   --  189  --   --   APTT  --  88*  < >  --  >200*  --  45*  --  36  --   --   --   --   --   LABPROT  --  21.7*  --   --   --   --   --   --   --   --   --   --   --   --   INR  --  1.86  --   --   --   --   --   --   --   --   --   --   --   --   HEPARINUNFRC >2.20* >2.20*  --   --   --   < >  --   --   --  0.55  --  0.45  --  0.62  CREATININE 1.27* 1.16*  --   --   --   --   --   --  0.91  --   --   --   --   --   TROPONINI  --   --   --   --   --   --   --   --   --   --  <0.03  --  <0.03  --   < > = values in this interval not displayed.  Estimated Creatinine Clearance: 77.3 mL/min (by C-G formula based on SCr of 0.91 mg/dL).  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 She is now s/p EKOS yesterday, still on heparin. Goal heparin level is slightly lower per MD post TPA. Heparin level and aPTT now correlate.   HL therapeutic this am (0.45> has drifted up this evening 0.62). Hgb and pltc stable.    Goal of Therapy:  Heparin level 0.2-0.5 Monitor platelets by anticoagulation protocol: Yes   Plan:  Decrease heparin at 1000 units/hr Daily CBC and HL F/u conversion back to [place].D. CPP, BCPS Clinical Pharmacist [phone] 10/04/2015 8:43 PM

## 2015-10-04 NOTE — Progress Notes (Signed)
ANTICOAGULATION CONSULT NOTE - Follow Up Consult  Pharmacy Consult for heparin Indication: DVT  Allergies  Allergen Reactions  . Metformin And Related Itching, Swelling and Other (See Comments)    Leg pain & swelling in legs  . Atorvastatin Itching and Rash  . Levofloxacin Itching    Patient Measurements: Height: 5\' 4"  (162.6 cm) Weight: 256 lb 9.9 oz (116.4 kg) IBW/kg (Calculated) : 54.7 Heparin Dosing Weight: 81.3 kg  Vital Signs: Temp: 98.8 F (37.1 C) (09/26 1145) Temp Source: Oral (09/26 1145) BP: 109/75 (09/26 1200) Pulse Rate: 64 (09/26 1200)  Labs:  Recent Labs  10/01/15 2000  10/02/15 0212 10/03/15 0329  10/03/15 1827 10/03/15 1851 10/03/15 1941 10/03/15 2040 10/04/15 0030 10/04/15 0500 10/04/15 0818 10/04/15 0830 10/04/15 1200  HGB  --   < > 10.4* 11.9*  --  11.1*  --   --   --  11.3*  --   --   --  10.8*  HCT  --   < > 33.4* 37.1  --  34.9*  --   --   --  35.3*  --   --   --  33.6*  PLT  --   < > 209 232  --  229  --   --   --  217  --   --   --  189  APTT  --   --   --  88*  < >  --  >200*  --  45*  --  36  --   --   --   LABPROT  --   --   --  21.7*  --   --   --   --   --   --   --   --   --   --   INR  --   --   --  1.86  --   --   --   --   --   --   --   --   --   --   HEPARINUNFRC  --   < > >2.20* >2.20*  --   --   --  >2.20*  --   --   --  0.55  --  0.45  CREATININE  --   --  1.27* 1.16*  --   --   --   --   --   --  0.91  --   --   --   TROPONINI <0.03  --   --   --   --   --   --   --   --   --   --   --  <0.03  --   < > = values in this interval not displayed.  Estimated Creatinine Clearance: 77.3 mL/min (by C-G formula based on SCr of 0.91 mg/dL).  Assessment: [name] is a 65 yo female with extensive DVT on xarelto PTA. She is now s/p EKOS yesterday, still on heparin. Goal heparin level is slightly lower per MD. Heparin level and aPTT now correlate, and HL therapeutic (0.45). Hgb and pltc stable. Continue current rate.   Goal of Therapy:   Heparin level 0.2-0.5 Monitor platelets by anticoagulation protocol: Yes   Plan:  Continue heparin at 1100 units/hr Daily CBC and HL F/u conversion back to [place], BS, PharmD Clinical Pharmacy Resident [phone] (Pager) 10/04/2015 12:58 PM

## 2015-10-04 NOTE — Sedation Documentation (Signed)
Pt. on clear liquid diet. Has NOT been NPO. Fentanyl only for pain medication during procedure.

## 2015-10-04 NOTE — Progress Notes (Signed)
Notified IR to relate message to MD Watts in regards to patient having increased pain and now having to doppler left popliteal and posterior tibialis. Patient is scheduled to be taking back to IR within the next hour. Will continue to monitor and assess.

## 2015-10-04 NOTE — Progress Notes (Signed)
ANTICOAGULATION CONSULT NOTE - Follow Up Consult  Pharmacy Consult for heparin Indication: DVT  Labs:  Recent Labs  10/01/15 2000  10/02/15 0212 10/03/15 0329  10/03/15 1827 10/03/15 1851 10/03/15 1941 10/03/15 2040 10/04/15 0030 10/04/15 0500  HGB  --   < > 10.4* 11.9*  --  11.1*  --   --   --  11.3*  --   HCT  --   < > 33.4* 37.1  --  34.9*  --   --   --  35.3*  --   PLT  --   < > 209 232  --  229  --   --   --  217  --   APTT  --   --   --  88*  < >  --  >200*  --  45*  --  36  LABPROT  --   --   --  21.7*  --   --   --   --   --   --   --   INR  --   --   --  1.86  --   --   --   --   --   --   --   HEPARINUNFRC  --   --  >2.20* >2.20*  --   --   --  >2.20*  --   --   --   CREATININE  --   --  1.27* 1.16*  --   --   --   --   --   --  0.91  TROPONINI <0.03  --   --   --   --   --   --   --   --   --   --   < > = values in this interval not displayed.   Assessment: 65yo female now w/ lower PTT after rate increase; of note RNs haven't been able to pull back blood from hand IV site so have been having to pause heparin and flush prior to drawing labs from line where heparin is running, which may be causing inaccurate labs; on this draw heparin was held ~58min which shouldn't cause this much of a drop in PTT.  Goal of Therapy:  aPTT 60-84 seconds   Plan:  Will increase heparin gtt to 1100 units/hr and check PTT in 6hr.  Wynona Neat, PharmD, BCPS  10/04/2015,6:33 AM

## 2015-10-05 ENCOUNTER — Encounter (HOSPITAL_COMMUNITY): Payer: Self-pay

## 2015-10-05 ENCOUNTER — Inpatient Hospital Stay (HOSPITAL_COMMUNITY): Payer: Medicare HMO

## 2015-10-05 DIAGNOSIS — J9601 Acute respiratory failure with hypoxia: Secondary | ICD-10-CM

## 2015-10-05 DIAGNOSIS — I5042 Chronic combined systolic (congestive) and diastolic (congestive) heart failure: Secondary | ICD-10-CM

## 2015-10-05 DIAGNOSIS — M79605 Pain in left leg: Secondary | ICD-10-CM

## 2015-10-05 LAB — CBC
HCT: 36.9 % (ref 36.0–46.0)
Hemoglobin: 11.9 g/dL — ABNORMAL LOW (ref 12.0–15.0)
MCH: 29.6 pg (ref 26.0–34.0)
MCHC: 32.2 g/dL (ref 30.0–36.0)
MCV: 91.8 fL (ref 78.0–100.0)
PLATELETS: 180 10*3/uL (ref 150–400)
RBC: 4.02 MIL/uL (ref 3.87–5.11)
RDW: 15.3 % (ref 11.5–15.5)
WBC: 7.8 10*3/uL (ref 4.0–10.5)

## 2015-10-05 LAB — GLUCOSE, CAPILLARY
GLUCOSE-CAPILLARY: 229 mg/dL — AB (ref 65–99)
GLUCOSE-CAPILLARY: 241 mg/dL — AB (ref 65–99)
Glucose-Capillary: 182 mg/dL — ABNORMAL HIGH (ref 65–99)
Glucose-Capillary: 195 mg/dL — ABNORMAL HIGH (ref 65–99)
Glucose-Capillary: 244 mg/dL — ABNORMAL HIGH (ref 65–99)

## 2015-10-05 LAB — HEPARIN LEVEL (UNFRACTIONATED)
HEPARIN UNFRACTIONATED: 0.5 [IU]/mL (ref 0.30–0.70)
HEPARIN UNFRACTIONATED: 0.45 [IU]/mL (ref 0.30–0.70)

## 2015-10-05 MED ORDER — INSULIN ASPART 100 UNIT/ML ~~LOC~~ SOLN
0.0000 [IU] | Freq: Every day | SUBCUTANEOUS | Status: DC
  Administered 2015-10-05 – 2015-10-06 (×2): 2 [IU] via SUBCUTANEOUS

## 2015-10-05 MED ORDER — INSULIN NPH (HUMAN) (ISOPHANE) 100 UNIT/ML ~~LOC~~ SUSP
5.0000 [IU] | Freq: Once | SUBCUTANEOUS | Status: AC
  Administered 2015-10-05: 5 [IU] via SUBCUTANEOUS
  Filled 2015-10-05: qty 10

## 2015-10-05 MED ORDER — INSULIN ASPART 100 UNIT/ML ~~LOC~~ SOLN: 0.0000 [IU] | Freq: Three times a day (TID) | SUBCUTANEOUS | Status: DC

## 2015-10-05 MED ORDER — INSULIN GLARGINE 100 UNIT/ML ~~LOC~~ SOLN
40.0000 [IU] | Freq: Every day | SUBCUTANEOUS | Status: DC
  Administered 2015-10-05 – 2015-10-06 (×2): 40 [IU] via SUBCUTANEOUS
  Filled 2015-10-05 (×3): qty 0.4

## 2015-10-05 MED ORDER — INSULIN ASPART 100 UNIT/ML ~~LOC~~ SOLN
0.0000 [IU] | Freq: Three times a day (TID) | SUBCUTANEOUS | Status: DC
  Administered 2015-10-05: 3 [IU] via SUBCUTANEOUS
  Administered 2015-10-05 (×2): 5 [IU] via SUBCUTANEOUS
  Administered 2015-10-06: 2 [IU] via SUBCUTANEOUS
  Administered 2015-10-06 – 2015-10-07 (×3): 5 [IU] via SUBCUTANEOUS
  Administered 2015-10-07: 2 [IU] via SUBCUTANEOUS
  Administered 2015-10-07: 8 [IU] via SUBCUTANEOUS

## 2015-10-05 NOTE — Progress Notes (Signed)
ANTICOAGULATION CONSULT NOTE - Follow Up Consult  Pharmacy Consult for heparin Indication: DVT  Allergies  Allergen Reactions  . Metformin And Related Itching, Swelling and Other (See Comments)    Leg pain & swelling in legs  . Atorvastatin Itching and Rash  . Levofloxacin Itching    Patient Measurements: Height: 5\' 4"  (162.6 cm) Weight: 253 lb 8.5 oz (115 kg) IBW/kg (Calculated) : 54.7 Heparin Dosing Weight: 81.3 kg  Vital Signs: Temp: 99 F (37.2 C) (09/27 0721) Temp Source: Oral (09/27 0721) BP: 130/55 (09/27 0700) Pulse Rate: 56 (09/27 0700)  Labs:  Recent Labs  10/03/15 0329  10/03/15 1851  10/03/15 2040 10/04/15 0030 10/04/15 0500  10/04/15 0830 10/04/15 1200 10/04/15 1753 10/04/15 1930 10/05/15 0627  HGB 11.9*  < >  --   --   --  11.3*  --   --   --  10.8*  --   --  11.9*  HCT 37.1  < >  --   --   --  35.3*  --   --   --  33.6*  --   --  36.9  PLT 232  < >  --   --   --  217  --   --   --  189  --   --  180  APTT 88*  < > >200*  --  45*  --  36  --   --   --   --   --   --   LABPROT 21.7*  --   --   --   --   --   --   --   --   --   --   --   --   INR 1.86  --   --   --   --   --   --   --   --   --   --   --   --   HEPARINUNFRC >2.20*  --   --   < >  --   --   --   < >  --  0.45  --  0.62 0.50  CREATININE 1.16*  --   --   --   --   --  0.91  --   --   --   --   --   --   TROPONINI  --   --   --   --   --   --   --   --  <0.03  --  <0.03  --   --   < > = values in this interval not displayed.  Estimated Creatinine Clearance: 76.7 mL/min (by C-G formula based on SCr of 0.91 mg/dL).  Assessment: [name] is a 65 yo female with extensive DVT on xarelto PTA. She is now s/p EKOS yesterday, still on heparin. Goal heparin level is slightly lower per MD post TPA. Heparin level and aPTT now correlate.   Heparin level was high last evening and dose was ordered to be decreased. AM RN discovered this change was never made on pump. AM level of 0.5 was on 1100 units/hr  (since goal is 0.2 - 0.5; told RN to change as ordered   Goal of Therapy:  Heparin level 0.2-0.5 Monitor platelets by anticoagulation protocol: Yes   Plan:  Continue heparin at 1000 units/hr Repeat HL at 1200 Daily CBC and HL F/u conversion back to [employer], PharmD, BCPS, BCCCP Clinical Pharmacist  Pager [phone] 10/05/2015 7:56 AM

## 2015-10-05 NOTE — Progress Notes (Signed)
Notified Pharmacist Ronalee Belts in regards to heparin level not changed from 1100 units/hr to 1000 units per hour previous shift. Changed per order. Patient having no signs of bleeding, heparin level did decrease from 0.62 to .50 at [place] and [place] respectfully. Will continue to monitor and assess.

## 2015-10-05 NOTE — Progress Notes (Signed)
ANTICOAGULATION CONSULT NOTE - Follow Up Consult  Pharmacy Consult for heparin Indication: DVT  Allergies  Allergen Reactions  . Metformin And Related Itching, Swelling and Other (See Comments)    Leg pain & swelling in legs  . Atorvastatin Itching and Rash  . Levofloxacin Itching    Patient Measurements: Height: 5\' 4"  (162.6 cm) Weight: 253 lb 8.5 oz (115 kg) IBW/kg (Calculated) : 54.7 Heparin Dosing Weight: 81.3 kg  Vital Signs: Temp: 98.6 F (37 C) (09/27 1116) Temp Source: Oral (09/27 1116) BP: 116/59 (09/27 1100) Pulse Rate: 60 (09/27 1100)  Labs:  Recent Labs  10/03/15 0329  10/03/15 1851  10/03/15 2040 10/04/15 0030 10/04/15 0500  10/04/15 0830 10/04/15 1200 10/04/15 1753 10/04/15 1930 10/05/15 0627 10/05/15 1134  HGB 11.9*  < >  --   --   --  11.3*  --   --   --  10.8*  --   --  11.9*  --   HCT 37.1  < >  --   --   --  35.3*  --   --   --  33.6*  --   --  36.9  --   PLT 232  < >  --   --   --  217  --   --   --  189  --   --  180  --   APTT 88*  < > >200*  --  45*  --  36  --   --   --   --   --   --   --   LABPROT 21.7*  --   --   --   --   --   --   --   --   --   --   --   --   --   INR 1.86  --   --   --   --   --   --   --   --   --   --   --   --   --   HEPARINUNFRC >2.20*  --   --   < >  --   --   --   < >  --  0.45  --  0.62 0.50 0.45  CREATININE 1.16*  --   --   --   --   --  0.91  --   --   --   --   --   --   --   TROPONINI  --   --   --   --   --   --   --   --  <0.03  --  <0.03  --   --   --   < > = values in this interval not displayed.  Estimated Creatinine Clearance: 76.7 mL/min (by C-G formula based on SCr of 0.91 mg/dL).  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 She is now s/p EKOS, still on heparin. Goal heparin level is 0.2-0.5 post tPA, per MD. Heparin level and aPTT now correlate. Heparin rate was ordered to be decreased last night, however change was never made on the pump. AM level of 0.5 was on 1100  units/hr (since goal is 0.2 - 0.5). RN changed to appropriate rate at 0745 this am and level is now therapeutic at 0.45.   Goal of Therapy:  Heparin level 0.2-0.5 Monitor platelets by anticoagulation protocol: Yes   Plan:  Continue heparin at 1000 units/hr Daily CBC and HL F/u conversion back to [place], BS, PharmD Clinical Pharmacy Resident [phone] (Pager) 10/05/2015 12:44 PM

## 2015-10-05 NOTE — Progress Notes (Signed)
Physical Therapy Treatment Patient Details Name: [name] MRN: [id number] DOB: [birth year] Today's Date: 10/05/2015    History of Present Illness 65 y.o. female with history of obesity, CHF/ICM on home oxygen therapy, coronary artery disease with prior CABG/ICD, diabetes, hypertension, obstructive sleep apnea, hyperlipidemia who was recently admitted to the hospital with three-month history of left leg pain and swelling. Initially patient felt symptoms were related to arthritis. She was seen by her primary care physician yesterday and subsequently ordered a left lower extremity venous Doppler which revealed thrombus in the femoral, profunda, popliteal and greater saphenous veins. For possible venogram and lytic therapy 9/25  Now s/p S/p DVT lysis of LLE with angioplasty and stenting of the L common/external iliac vein. on 9/25    PT Comments    Patient just now allowed EOB activity since stents and lytic therapy.  RN stated to hold off on OOB until tomorrow.  Feel continued skilled PT in the acute setting indicated prior to d/c home with HHPT.   Follow Up Recommendations  Home health PT;Supervision - Intermittent     Equipment Recommendations  3in1 (PT)    Recommendations for Other Services       Precautions / Restrictions Precautions Precautions: Fall Restrictions Weight Bearing Restrictions: Yes LLE Weight Bearing: Non weight bearing (per RN until cleared by MD after stent placement)    Mobility  Bed Mobility Overal bed mobility: Needs Assistance Bed Mobility: Supine to Sit     Supine to sit: Min assist Sit to supine: Min assist   General bed mobility comments: use of rails, A for LLE  Transfers                 General transfer comment: up to EOB only per RN  Ambulation/Gait                 Stairs            Wheelchair Mobility    Modified Rankin (Stroke Patients Only)       Balance Overall balance assessment: Needs  assistance Sitting-balance support: Feet unsupported;No upper extremity supported Sitting balance-Leahy Scale: Good Sitting balance - Comments: sat EOB x 10 min for UE/LE therex                            Cognition Arousal/Alertness: Awake/alert Behavior During Therapy: Flat affect Overall Cognitive Status: Within Functional Limits for tasks assessed                      Exercises General Exercises - Upper Extremity Shoulder Flexion: AROM;10 reps;Both;Seated Elbow Flexion: Strengthening;Both;10 reps;Seated;Bar weights/barbell Bar Weights/Barbell (Elbow Flexion): Other (comment) (soda can and water bottle) General Exercises - Lower Extremity Ankle Circles/Pumps: AROM;Both;10 reps;Supine Long Arc Quad: AROM;Both;10 reps;Seated Other Exercises Other Exercises: ceervical AROM 3 reps rotation and flex/ext Other Exercises: shoulder rolls x 10    General Comments        Pertinent Vitals/Pain Pain Assessment: Faces Faces Pain Scale: Hurts little more Pain Location: L LE with dependency Pain Descriptors / Indicators: Heaviness Pain Intervention(s): Monitored during session;Repositioned;Limited activity within patient's tolerance    Home Living                      Prior Function            PT Goals (current goals can now be found in the care plan section) Progress towards PT goals: Not  progressing toward goals - comment (due to activity restrictions)    Frequency    Min 3X/week      PT Plan Current plan remains appropriate    Co-evaluation             End of Session   Activity Tolerance: Patient tolerated treatment well Patient left: in bed;with call bell/phone within reach     Time: 1258-1317 PT Time Calculation (min) (ACUTE ONLY): 19 min  Charges:  $Therapeutic Exercise: 8-22 mins                    G Codes:      [name] 2015/10/13, 2:03 PM  [name], [name] 10-13-15

## 2015-10-05 NOTE — Progress Notes (Signed)
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 She was seen by PCP and given steroid injection to left knee. PCP preformed LEDS and LLE dvt was noted. She was admitted 9/22 and treated with heparin and Xarelto. Her left lower ext was increasing in size and pain and the decision was made to start catheter directed lysis of left lower ext via tibial/popliteal veins at 1745. She was moved to ICU and PCCM asked to assist in her care.  SUBJECTIVE:  No acute events overnight. Patient transitioned off catheter lytics yesterday in PM. Patient denies any chest pain or pressure. She denies any dyspnea or coughing. She is hungry and wishes to eat. She reports she has no pain in her left lower extremity except with lifting it off the bed.   REVIEW OF SYSTEMS:  No near syncope or syncope. No palpitations. No fever or chills. No nausea, emesis, or abdominal pain.  VITAL SIGNS: BP 110/84   Pulse 61   Temp 99 F (37.2 C) (Oral)   Resp (!) 22   Ht 5\' 4"  (1.626 m)   Wt 253 lb 8.5 oz (115 kg)   SpO2 100%   BMI 43.52 kg/m   HEMODYNAMICS:    VENTILATOR SETTINGS:    INTAKE / OUTPUT: I/O last 3 completed shifts: In: 1882.1 [P.O.:360; I.V.:1522.1] Out: -   PHYSICAL EXAMINATION: General:  Awake. TV on. No distress. Nurse at bedside. Neuro: Moving all 4 extremities equally. CN grossly in tact. Following commands.  HEENT:  MMM. No scleral icterus. No scleral injection.  Cardiovascular:  Regular rate & rhythm. Edema in extremities noted. Extremities warm.  Lungs:  Clear bilaterally to auscultation. Diminished breath sounds  bilateral lung bases. Normal work of breathing on oxygen.  Abdomen:  Soft. Protuberant. Nontender. Normal bowel sounds. Skin:  Warm and dry. No rash on exposed skin. Dressing in place in LLE is clean & dry.   LABS:  BMET  Recent Labs Lab 10/02/15 0212 10/03/15 0329 10/04/15 0500  NA 137 133* 137  K 3.9 4.4 4.0  CL 102 99* 99*  CO2 26 26 30   BUN 15 15 12   CREATININE 1.27* 1.16* 0.91  GLUCOSE 177* 205* 157*    Electrolytes  Recent Labs Lab 10/02/15 0212 10/03/15 0329 10/04/15 0500  CALCIUM 9.2 9.7 9.4    CBC  Recent Labs Lab 10/04/15 0030 10/04/15 1200 10/05/15 0627  WBC 5.7 5.7 7.8  HGB 11.3* 10.8* 11.9*  HCT 35.3* 33.6* 36.9  PLT 217 189 180    Coag's  Recent Labs Lab 09/30/15 1912 10/03/15 0329  10/03/15 1851 10/03/15 2040 10/04/15 0500  APTT  --  88*  < > >200* 45* 36  INR 1.10 1.86  --   --   --   --   < > = values in this interval not displayed.  Sepsis Markers No results for input(s): LATICACIDVEN, PROCALCITON, O2SATVEN in the last 168 hours.  ABG No results for input(s): PHART, PCO2ART, PO2ART in the last 168 hours.  Liver Enzymes  Recent Labs Lab 09/30/15 1659 10/03/15 0329  AST  19 20  ALT 15 16  ALKPHOS 78 62  BILITOT 0.6 0.5  ALBUMIN 3.7 3.4*    Cardiac Enzymes  Recent Labs Lab 10/01/15 2000 10/04/15 0830 10/04/15 1753  TROPONINI <0.03 <0.03 <0.03    Glucose  Recent Labs Lab 10/03/15 1821 10/03/15 2230 10/04/15 0817 10/04/15 1144 10/04/15 1659 10/04/15 2200  GLUCAP 132* 177* 156* 240* 232* 259*    Imaging Ir Thrombect Veno Mech Mod Sed  Result Date: 10/04/2015 INDICATION: 65 year old female with 3 months history of left lower extremity pain and swelling in, recently diagnosed with extensive bilateral lower extremity DVT, however only her left lower extremity is symptomatic. Patient failed conservative management with hyper does a shin and as such weekend catheter directed thrombolysis on 10/03/2015.  Patient presents today for repeat lower extremity venogram and potential intervention EXAM: 1. IR THROMBO FOLLOW-UP EVAL VENOUS SUBSEQUENT DAY 2. LEFT LOWER EXTREMITY VENOUS ANGIOPLASTY 3. FLUOROSCOPIC GUIDED DEPLOYMENT OF A VENOUS STENT. COMPARISON:  Initiation of catheter directed thrombolysis - 10/03/2015 MEDICATIONS: None CONTRAST:  130 cc Isovue 300 ANESTHESIA/SEDATION: None FLUOROSCOPY TIME:  11 minutes 24 seconds (123XX123 mGy) COMPLICATIONS: None immediate. TECHNIQUE: Informed written consent was obtained from the patient after a discussion of the risks, benefits and alternatives to treatment. Questions regarding the procedure were encouraged and answered. A timeout was performed prior to the initiation of the procedure. The patient was placed prone on the fluoroscopy table and the external portion of the left popliteal vein approach vascular sheath as well as the surrounding skin were prepped and draped in the usual sterile fashion, and a sterile drape was applied covering the operative field. Maximum barrier sterile technique with sterile gowns and gloves were used for the procedure. Initial left lower extremity venogram was performed through the side arm of the vascular sheath. The infusion wire was removed and the infusion catheter was a cannulated with an exchange length Rosen wire which was advanced to the level of the cranial aspect of the IVC. A vertebral catheter was advanced into the caudal aspect of the IVC and pullback venograms were performed through the level of the left femoral head. Next, the 6 French popliteal vein approach vascular sheath was exchanged for a 10 [place] vascular sheath which was advanced to the level of the mid femoral aspect of the left superficial femoral vein. Mechanical thrombectomy was performed with the use of an Angiojet device from the level of the left common and external iliac veins extending to the left common femoral vein. Prolonged balloon angioplasty was performed at  multiple stations beginning at the level of the mid left superficial femoral vein extending to the left common iliac veins confluence with the IVC. Post angioplasty venograms were performed. Next, an 16 x 90 mm Wall stent was deployed extending from the caudal aspect of the IVC to the mid aspect of the left external iliac vein. The stent was angioplastied at multiple stations with a 14 mm x 4 cm Atlas angioplasty balloon. Post stent deployment venogram images were performed through the vascular sheath. Mechanical thrombectomy was again performed with the use of an Angiojet device at location of a minimal amount of residual nonocclusive thrombus at the confluence of the left deep and superficial femoral veins. Prolonged balloon angioplasty was again performed at multiple stations within 8 mm x 8 cm Mustang Balloon extending from the level of the mid femoral vein to the peripheral aspect of the venous stent. Completion left lower extremity central and pelvic venogram images were obtained. Images were  reviewed and the procedure was terminated. All wires, catheters and sheaths were removed from the patient. Hemostasis was achieved at both the left popliteal and calf venous access site with manual compression. Dressings were placed. The patient tolerated the procedure well without immediate postprocedural complication. FINDINGS: Initial left lower extremity venogram demonstrates wide patency of the cranial aspect of the left popliteal vein as well as the distal aspect of the left superficial femoral vein. 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 Pelvic venogram demonstrates improved patency of both the left common and external iliac veins with a minimal amount of residual nonocclusive thrombus within the peripheral aspect of the left external iliac vein.  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 Successful prolonged balloon angioplasty of the mid / proximal aspects of the left femoral vein as well as the left common femoral vein with post angioplasty venogram images demonstrating restored antegrade flow from the level of the mid left femoral vein to the IVC. Severe narrowing of the origin of the left common iliac vein was successfully treated with deployment of 16 mm x 9 cm Wall stent which was angioplastied to 14 mm diameter. Completion images demonstrate restored patency and antegrade flow from the level of the left mid femoral vein to the IVC. IMPRESSION: 1. 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 2. 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 PLAN: - Bed rest for 2 hours with left lower extremity maintain flat. Then may ambulate as tolerated. - Would continue IV heparin until patient is therapeutically anticoagulated with Lovenox. - Would recommend Lovenox anti coagulation for at least the next 4-6 weeks prior to converting to an oral anticoagulant. - Would recommend bilateral thigh-high compression stockings can at least 6 months of anti coagulation. - The patient will be given an appointment to be evaluated at the interventional radiology clinic [phone]) in approximately 4 weeks at which time a bilateral lower extremity venous Doppler ultrasound will be obtained. Electronically Signed   By: Sandi Mariscal M.D.   On: 10/04/2015 17:37   Ir [employer] Stent 1st Art Not Therese Sarah Car Vert Car  Result Date: 10/04/2015 INDICATION: 65 year old female with 3 months history of left lower extremity pain and swelling in, recently diagnosed with  extensive bilateral lower extremity DVT, however only her left lower extremity is symptomatic. Patient failed conservative management with hyper does a shin and as such weekend catheter directed thrombolysis on 10/03/2015. Patient presents today for repeat lower extremity venogram and potential intervention EXAM: 1. IR THROMBO FOLLOW-UP EVAL VENOUS SUBSEQUENT DAY 2. LEFT LOWER EXTREMITY VENOUS ANGIOPLASTY 3. FLUOROSCOPIC GUIDED DEPLOYMENT OF A VENOUS STENT. COMPARISON:  Initiation of catheter directed thrombolysis - 10/03/2015 MEDICATIONS: None CONTRAST:  130 cc Isovue 300 ANESTHESIA/SEDATION: None FLUOROSCOPY TIME:  11 minutes 24 seconds (123XX123 mGy) COMPLICATIONS: None immediate. TECHNIQUE: Informed written consent was obtained from the patient after a discussion of the risks, benefits and alternatives to treatment. Questions regarding the procedure were encouraged and answered. A timeout was performed prior to the initiation of the procedure. The patient was placed  prone on the fluoroscopy table and the external portion of the left popliteal vein approach vascular sheath as well as the surrounding skin were prepped and draped in the usual sterile fashion, and a sterile drape was applied covering the operative field. Maximum barrier sterile technique with sterile gowns and gloves were used for the procedure. Initial left lower extremity venogram was performed through the side arm of the vascular sheath. The infusion wire was removed and the infusion catheter was a cannulated with an exchange length Rosen wire which was advanced to the level of the cranial aspect of the IVC. A vertebral catheter was advanced into the caudal aspect of the IVC and pullback venograms were performed through the level of the left femoral head. Next, the 6 French popliteal vein approach vascular sheath was exchanged for a 10 [place] vascular sheath which was advanced to the level of the mid femoral aspect of the left superficial femoral  vein. Mechanical thrombectomy was performed with the use of an Angiojet device from the level of the left common and external iliac veins extending to the left common femoral vein. Prolonged balloon angioplasty was performed at multiple stations beginning at the level of the mid left superficial femoral vein extending to the left common iliac veins confluence with the IVC. Post angioplasty venograms were performed. Next, an 16 x 90 mm Wall stent was deployed extending from the caudal aspect of the IVC to the mid aspect of the left external iliac vein. The stent was angioplastied at multiple stations with a 14 mm x 4 cm Atlas angioplasty balloon. Post stent deployment venogram images were performed through the vascular sheath. Mechanical thrombectomy was again performed with the use of an Angiojet device at location of a minimal amount of residual nonocclusive thrombus at the confluence of the left deep and superficial femoral veins. Prolonged balloon angioplasty was again performed at multiple stations within 8 mm x 8 cm Mustang Balloon extending from the level of the mid femoral vein to the peripheral aspect of the venous stent. Completion left lower extremity central and pelvic venogram images were obtained. Images were reviewed and the procedure was terminated. All wires, catheters and sheaths were removed from the patient. Hemostasis was achieved at both the left popliteal and calf venous access site with manual compression. Dressings were placed. The patient tolerated the procedure well without immediate postprocedural complication. FINDINGS: Initial left lower extremity venogram demonstrates wide patency of the cranial aspect of the left popliteal vein as well as the distal aspect of the left superficial femoral vein. 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 Pelvic venogram demonstrates improved patency of both the left common and external iliac veins with a minimal amount of residual nonocclusive thrombus within the peripheral aspect of the left external iliac vein. 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 Successful prolonged balloon angioplasty of the mid / proximal aspects of the left femoral vein as well as the left common femoral vein with post angioplasty venogram images demonstrating restored antegrade flow from the level of the mid left femoral vein to the IVC. Severe narrowing of the origin of the left common iliac vein was successfully treated with deployment of 16 mm x 9 cm Wall stent which was angioplastied to 14 mm diameter. Completion images  demonstrate restored patency and antegrade flow from the level of the left mid femoral vein to the IVC. IMPRESSION: 1. 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 2. 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 PLAN: - Bed rest for 2 hours with left lower extremity maintain flat. Then may ambulate as tolerated. - Would continue IV heparin until patient is therapeutically anticoagulated with Lovenox. - Would recommend Lovenox anti coagulation for at least the next 4-6 weeks prior to converting to an oral anticoagulant. - Would recommend bilateral thigh-high compression stockings can at least 6 months of anti coagulation. - The patient will be given an appointment to be evaluated at the interventional radiology clinic [phone]) in approximately 4 weeks at which time a bilateral lower extremity venous Doppler ultrasound will be obtained. Electronically Signed   By: Sandi Mariscal M.D.    On: 10/04/2015 17:37   Ir Jacolyn Reedy F/u Eval Art/ven Alwyn Ren Day (ms)  Result Date: 10/04/2015 INDICATION: 65 year old female with 3 months history of left lower extremity pain and swelling in, recently diagnosed with extensive bilateral lower extremity DVT, however only her left lower extremity is symptomatic. Patient failed conservative management with hyper does a shin and as such weekend catheter directed thrombolysis on 10/03/2015. Patient presents today for repeat lower extremity venogram and potential intervention EXAM: 1. IR THROMBO FOLLOW-UP EVAL VENOUS SUBSEQUENT DAY 2. LEFT LOWER EXTREMITY VENOUS ANGIOPLASTY 3. FLUOROSCOPIC GUIDED DEPLOYMENT OF A VENOUS STENT. COMPARISON:  Initiation of catheter directed thrombolysis - 10/03/2015 MEDICATIONS: None CONTRAST:  130 cc Isovue 300 ANESTHESIA/SEDATION: None FLUOROSCOPY TIME:  11 minutes 24 seconds (123XX123 mGy) COMPLICATIONS: None immediate. TECHNIQUE: Informed written consent was obtained from the patient after a discussion of the risks, benefits and alternatives to treatment. Questions regarding the procedure were encouraged and answered. A timeout was performed prior to the initiation of the procedure. The patient was placed prone on the fluoroscopy table and the external portion of the left popliteal vein approach vascular sheath as well as the surrounding skin were prepped and draped in the usual sterile fashion, and a sterile drape was applied covering the operative field. Maximum barrier sterile technique with sterile gowns and gloves were used for the procedure. Initial left lower extremity venogram was performed through the side arm of the vascular sheath. The infusion wire was removed and the infusion catheter was a cannulated with an exchange length Rosen wire which was advanced to the level of the cranial aspect of the IVC. A vertebral catheter was advanced into the caudal aspect of the IVC and pullback venograms were performed through the level of the  left femoral head. Next, the 6 French popliteal vein approach vascular sheath was exchanged for a 10 [place] vascular sheath which was advanced to the level of the mid femoral aspect of the left superficial femoral vein. Mechanical thrombectomy was performed with the use of an Angiojet device from the level of the left common and external iliac veins extending to the left common femoral vein. Prolonged balloon angioplasty was performed at multiple stations beginning at the level of the mid left superficial femoral vein extending to the left common iliac veins confluence with the IVC. Post angioplasty venograms were performed. Next, an 16 x 90 mm Wall stent was deployed extending from the caudal aspect of  the IVC to the mid aspect of the left external iliac vein. The stent was angioplastied at multiple stations with a 14 mm x 4 cm Atlas angioplasty balloon. Post stent deployment venogram images were performed through the vascular sheath. Mechanical thrombectomy was again performed with the use of an Angiojet device at location of a minimal amount of residual nonocclusive thrombus at the confluence of the left deep and superficial femoral veins. Prolonged balloon angioplasty was again performed at multiple stations within 8 mm x 8 cm Mustang Balloon extending from the level of the mid femoral vein to the peripheral aspect of the venous stent. Completion left lower extremity central and pelvic venogram images were obtained. Images were reviewed and the procedure was terminated. All wires, catheters and sheaths were removed from the patient. Hemostasis was achieved at both the left popliteal and calf venous access site with manual compression. Dressings were placed. The patient tolerated the procedure well without immediate postprocedural complication. FINDINGS: Initial left lower extremity venogram demonstrates wide patency of the cranial aspect of the left popliteal vein as well as the distal aspect of the left  superficial femoral vein. 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 Pelvic venogram demonstrates improved patency of both the left common and external iliac veins with a minimal amount of residual nonocclusive thrombus within the peripheral aspect of the left external iliac vein. 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 Successful prolonged balloon angioplasty of the mid / proximal aspects of the left femoral vein as well as the left common femoral vein with post angioplasty venogram images demonstrating restored antegrade flow from the level of the mid left femoral vein to the IVC. Severe narrowing of the origin of the left common iliac vein was successfully treated with deployment of 16 mm x 9 cm Wall stent which was angioplastied to 14 mm diameter. Completion images demonstrate restored patency and antegrade flow from the level of the left mid femoral vein to the IVC. IMPRESSION: 1. 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 2. 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 PLAN: - Bed rest for 2 hours with left lower extremity maintain flat. Then may ambulate as tolerated. - Would continue IV heparin until patient is therapeutically anticoagulated with Lovenox. - Would recommend Lovenox anti coagulation for at least the next 4-6 weeks prior to converting to an oral anticoagulant. - Would recommend bilateral thigh-high compression stockings can at least 6 months of anti coagulation. - The patient will be given an appointment to  be evaluated at the interventional radiology clinic [phone]) in approximately 4 weeks at which time a bilateral lower extremity venous Doppler ultrasound will be obtained. Electronically Signed   By: Sandi Mariscal M.D.   On: 10/04/2015 17:37     STUDIES:  Venous Duplex Bilat LE 10-03-2022:  Acute DVT R femoral, popliteal & posterior tibial veins. Acute DVT L femoral, profunda, popliteal, posterior tibial & peroneal veins.   MICROBIOLOGY: MRSA PCR 9/25:  Negative   ANTIBIOTICS: None.  SIGNIFICANT EVENTS: 9/22 - Admit 9/25 - Transferred to ICU  on lytic therapy for extremity 9/26 - Completed extremity lytic therapy & stent placed  LINES/TUBES: L Leg Sheat 9/25 - 9/26 PIV x2  ASSESSMENT / PLAN:  PULMONARY A: Acute Hypoxic Respiratory Failure - Mild.  H/O OSA - Uses oxygen at night but not CPAP.  P:   Weaning FiO2 for Sat >90% Holding off on CTA of the chest Checking CXR PA/LAT   CARDIOVASCULAR A:  Extensive Bilateral LE DVT - S/P lytic therapy LLE finishing 9/26 w/ stent placement. H/O Systolic CHF - [id number] EF [id number] H/O CAD S/P CABG H/O Hyperlipidemia  H/O LBBB  P:  Continuous telemetry monitoring Vitals per unit protocol Heparin gtt per pharmacy protocol Continue Lasix bid, Imdur, Aldacone, Coreg, & Lisinopril.  RENAL A:   CKD  P:   Trending UOP Monitoring renal function & electrolytes daily Replacing electrolytes as indicated  GASTROINTESTINAL A:   No acute issues.  P:   Clear Liquid Diet D/C Protonix  HEMATOLOGIC A:   Extensive Bilat LE DVT Anemia - Mild. No signs of active bleeding. Hgb stable.  P:  Trending cell counts w/ CBC daily Heparin gtt per pharmacy protocol  INFECTIOUS A:   No evidence of infection.  P:   Monitor for signs/symptoms of infection.  ENDOCRINE A:   H/O DM Type 2 -  A1c 7.8  P:   Increase Lantus to 40u Martin Continue Accu-checks qAC & HS Increase to Moderate SSI per algorithm  NEUROLOGIC A:   Chronic LLE Pain  P:    Tylenol PRN Oxycodone PRN Holding home Robaxin & Mobic  FAMILY  - Updates: Patient updated at bedside 9/27 by Dr. Ashok Cordia.   - Inter-disciplinary family meet or Palliative Care meeting due by:  10/3  TODAY'S SUMMARY:  65 y.o. female with morbid obesity (256 lbs) never smoker, 3/28 breast bx with non malignant HYALINIZED FIBROADENOMA, IDDM > 5years, CAD/CHF with G1DD ef 45%, who noted 3 months of left leg pain and SOB. She was seen by PCP and given steroid injection to left knee. PCP preformed LEDS and LLE dvt was noted. She was admitted 9/22 and treated with heparin and Xarelto. Her left lower ext was increasing in size and pain and the decision was made to start catheter directed lysis of left lower ext via tibial/popliteal veins at 1745. Has completed lytic therapy. Continuing patient on systemic anticoagulation with heparin drip. Monitoring renal function with home diuretic regimen. Increasing basal insulin for better control of her DM. Checking CXR PA/LAT given mild hypoxia. No other symptoms at this time to suggest PE but would have low threshold for CTA of the chest. Transferring to Telemetry.   IMTS to assume care & PCCM off as of 9/28.   Sonia Baller Ashok Cordia, M.D. [facility] Pager:  [phone] After 3pm or if no response, call [phone] 10/05/2015, 7:52 AM

## 2015-10-05 NOTE — Progress Notes (Signed)
CSW consult for SNF placement acknowledged re: "SNF Placement". PT recommending home health PT; Supervision- Intermittent with personal care attendant if covered through Patient's insurance. RN Case Manager aware and Patient already established with [facility] and wants to remain with same agency. CSW signing off. Please contact if new need(s) arise.          Emiliano Dyer, LCSW [facility] ED/93M Clinical Social Worker [phone]

## 2015-10-05 NOTE — Progress Notes (Signed)
Notified by Dr. Ashok Cordia that patient is ready to be transferred out of ICU. IMTS will resume care on 9/28 at [facility].

## 2015-10-05 NOTE — Progress Notes (Signed)
Patient ID: [name], female   DOB: [birth year], 65 y.o.   MRN: [id number]    Referring Physician(s): Dr. Carlyle Basques  Supervising Physician: Sandi Mariscal  Patient Status: inpt  Chief Complaint: LLE DVT  Subjective: Patient feeling well this morning.  No real pain in her LLE.  Allergies: Metformin and related; Atorvastatin; and Levofloxacin  Medications: Prior to Admission medications   Medication Sig Start Date End Date Taking? Authorizing Provider  albuterol (PROVENTIL HFA;VENTOLIN HFA) 108 (90 BASE) MCG/ACT inhaler Inhale 2 puffs into the lungs every 6 (six) hours as needed for wheezing or shortness of breath.   Yes Historical Provider, MD  albuterol (PROVENTIL) (2.5 MG/3ML) 0.083% nebulizer solution Take 2.5 mg by nebulization every 6 (six) hours as needed for wheezing or shortness of breath.  09/30/15  Yes Historical Provider, MD  aspirin EC 81 MG EC tablet Take 1 tablet (81 mg total) by mouth daily. 09/11/12  Yes Jerline Pain, MD  carvedilol (COREG) 12.5 MG tablet Take 12.5 mg by mouth 2 (two) times daily with a meal.  03/30/15  Yes Historical Provider, MD  Choline Fenofibrate (FENOFIBRIC ACID) 135 MG CPDR Take 135 mg by mouth daily. 02/09/15  Yes Historical Provider, MD  fluconazole (DIFLUCAN) 150 MG tablet Take 150 mg by mouth daily.  05/15/15  Yes Historical Provider, MD  furosemide (LASIX) 80 MG tablet Take 1 tablet (80 mg total) by mouth 2 (two) times daily. 08/19/15  Yes Jerline Pain, MD  glipiZIDE (GLUCOTROL) 5 MG tablet Take 5 mg by mouth daily before breakfast.    Yes Historical Provider, MD  IRON PO Take 1 tablet by mouth daily.   Yes Historical Provider, MD  isosorbide mononitrate (IMDUR) 30 MG 24 hr tablet Take 1 tablet (30 mg total) by mouth daily. 08/19/15 11/17/15 Yes Jerline Pain, MD  ketoconazole (NIZORAL) 2 % cream Apply 1 application topically 2 (two) times daily.  03/30/15  Yes Historical Provider, MD  latanoprost (XALATAN) 0.005 % ophthalmic solution Place 1  drop into both eyes at bedtime.   Yes Historical Provider, MD  lisinopril (PRINIVIL,ZESTRIL) 10 MG tablet Take 10 mg by mouth daily.    Yes Historical Provider, MD  meloxicam (MOBIC) 7.5 MG tablet Take 7.5 mg by mouth daily. 02/10/15  Yes Historical Provider, MD  methocarbamol (ROBAXIN) 500 MG tablet Take 1 tablet (500 mg total) by mouth every 8 (eight) hours as needed for muscle spasms. 05/30/14  Yes Barton Dubois, MD  nitroGLYCERIN (NITROSTAT) 0.4 MG SL tablet Place 1 tablet (0.4 mg total) under the tongue every 5 (five) minutes as needed. Patient taking differently: Place 0.4 mg under the tongue every 5 (five) minutes as needed for chest pain.  05/19/15  Yes Scott T Weaver, PA-C  NOVOLOG FLEXPEN 100 UNIT/ML FlexPen Inject 30-50 Units into the skin See admin instructions. 50 units every morning, 30 units at lunch and 50 units at suppre 09/12/15  Yes Historical Provider, MD  potassium chloride SA (K-DUR,KLOR-CON) 20 MEQ tablet Take 20 mEq by mouth. 09/12/15  Yes Historical Provider, MD  potassium phosphate, monobasic, (K-PHOS ORIGINAL) 500 MG tablet Take 500 mg by mouth daily as needed (takes when needed for spasms).    Yes Historical Provider, MD  spironolactone (ALDACTONE) 25 MG tablet Take 0.5 tablets (12.5 mg total) by mouth daily. 03/28/15  Yes Liliane Shi, PA-C    Vital Signs: BP (!) 117/52   Pulse (!) 59   Temp 99 F (37.2 C) (Oral)  Resp (!) 23   Ht 5\' 4"  (1.626 m)   Wt 253 lb 8.5 oz (115 kg)   SpO2 99%   BMI 43.52 kg/m   Physical Exam: Ext: LLE with mild edema around her ankle, but otherwise normal.  Palpable pedal pulse.  Popliteal site is c/d/i as well as L DP site.  Imaging: Ir Veno/ext/uni Left  Result Date: 10/03/2015 INDICATION: 65 year old female with a 3 month history of left lower extremity pain and swelling. She was recently diagnosed with extensive bilateral lower extremity DVT. However, only the left lower extremity is symptomatic. She has not experienced relief despite  several days of heparinization. She presents for catheter directed thrombolysis. EXAM: IR INFUSION THROMBOL VENOUS INITIAL (MS); IR ULTRASOUND GUIDANCE VASC ACCESS LEFT; LEFT EXTREMITY VENOGRAPHY; INFERIOR VENA CAVOGRAM 1. Ultrasound-guided access anterior tibial vein 2. Left lower extremity venogram 3. Ultrasound-guided access popliteal vein 4. Inferior vena cavagram 5. Initiation of venous thrombolysis COMPARISON:  None. MEDICATIONS: None. ANESTHESIA/SEDATION: Versed 1.5 mg IV; Fentanyl 75 mcg IV Moderate Sedation Time:  42 minutes The patient was continuously monitored during the procedure by the interventional radiology nurse under my direct supervision. FLUOROSCOPY TIME:  Fluoroscopy Time: 8 minutes 42 seconds (269 mGy). COMPLICATIONS: None immediate. TECHNIQUE: Informed written consent was obtained from the patient after a thorough discussion of the procedural risks, benefits and alternatives. All questions were addressed. Maximal Sterile Barrier Technique was utilized including caps, mask, sterile gowns, sterile gloves, sterile drape, hand hygiene and skin antiseptic. A timeout was performed prior to the initiation of the procedure. Ultrasound was used to interrogate the popliteal fossa. The popliteal vein appears completely thrombosed by ultrasound and is noncompressible. Ultrasound was used to interrogate the posterior tibial veins. The posterior tibial veins could not be identified and may be chronically occluded. Ultrasound was used to interrogate the anterior tibial vein. At least 1 of the paired anterior tibial veins is widely patent. Local anesthesia was attained by infiltration with 1% lidocaine. Under real-time sonographic guidance, the anterior tibial vein was punctured with a 21 gauge micropuncture needle. Using standard technique, the needle was exchanged over a micro wire for a 5 [place] transitional micro sheath which was then exchanged over a short Amplatz wire for a short 6 [place] vascular  sheath. Venography was performed through the 6 French sheath. The anterior tibial vein is patent. There is either a patent channel through the popliteal vein, or a venous collateral running parallel with the popliteal vein which is patent. A wire was advanced through the popliteal vein. Attention was again directed to the popliteal fossa. Local anesthesia was attained by infiltration with 1% lidocaine. Under real-time sonographic guidance, the popliteal vein was punctured with a 21 gauge micropuncture needle. Using standard technique, the needle was exchanged over a micro wire for a 5 [place] transitional micro sheath. The nerve she was performed. There appears to be a patent channel in the popliteal vein with chronic thrombus. Additional venography was performed of the right lower extremity. There are numerous collaterals throughout the right lower extremity. 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 Using a Glidewire and angled catheter the catheter was successfully advanced to the level of the lesser trochanter. Additional venography was performed. There is acute appearing thrombus in the femoral vein in the proximal thigh and common femoral vein at the femoral head. Thrombus extends into the profunda femoral vein. The catheter was successfully advanced into the iliac vein. 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 With some difficulty, the wire was eventually advanced beyond this occlusion and into the inferior vena cava. An inferior vena cavagram was performed. The inferior vena cava is patent. Inflow from the left iliac vein is not visualized. The catheter was brought back across the stenosis and venography performed confirming iliac vein compression with likely some chronic thrombus. A Rosen wire was advanced  into the inferior vena cava. The 5 French angled catheter was removed. A 135 cm total length, 50 cm infusion length ultrasound assisted infusion catheter was advanced over the Rosen wire and positioned from the region of iliac vein compression into the femoral vein in the distal thigh. Thrombolysis was then initiated with tPA at 1 milligram/hour. FINDINGS: 1. 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 Acute thrombus within the femoral vein in the proximal thigh and the common femoral vein 4. 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 Lysis check tomorrow. Patient will likely require stent placement for iliac vein compression. Signed, Criselda Peaches, MD [facility] Radiology Electronically Signed   By: Jacqulynn Cadet M.D.   On: 10/03/2015 18:08   Ir Mariana Arn Ivc  Result Date: 10/03/2015 INDICATION: 65 year old female with a 3 month history of left lower extremity pain and swelling. She was recently diagnosed with extensive bilateral lower extremity DVT. However, only the left lower extremity is symptomatic. She has not experienced relief despite several days of heparinization. She presents for catheter directed thrombolysis. EXAM: IR INFUSION THROMBOL VENOUS INITIAL (MS); IR ULTRASOUND GUIDANCE VASC ACCESS LEFT; LEFT EXTREMITY VENOGRAPHY; INFERIOR VENA CAVOGRAM 1. Ultrasound-guided access anterior tibial vein 2. Left lower extremity venogram 3. Ultrasound-guided access popliteal vein 4. Inferior vena cavagram 5. Initiation of venous thrombolysis COMPARISON:  None. MEDICATIONS: None. ANESTHESIA/SEDATION:  Versed 1.5 mg IV; Fentanyl 75 mcg IV Moderate Sedation Time:  42 minutes The patient was continuously monitored during the procedure by the interventional radiology nurse under my direct supervision. FLUOROSCOPY TIME:  Fluoroscopy Time: 8 minutes 42 seconds (269 mGy). COMPLICATIONS: None immediate. TECHNIQUE: Informed written consent was obtained from the patient after a thorough discussion of the procedural risks, benefits and alternatives. All questions were addressed. Maximal Sterile Barrier Technique was utilized including caps, mask, sterile gowns, sterile gloves, sterile drape, hand hygiene and skin antiseptic. A timeout was performed prior to the initiation of the procedure. Ultrasound was used to interrogate the popliteal fossa. The popliteal vein appears completely thrombosed by ultrasound and is noncompressible. Ultrasound was used to interrogate the posterior tibial veins. The posterior tibial veins could not be identified and may be chronically occluded. Ultrasound was used to interrogate the anterior tibial vein. At least 1 of the paired anterior tibial veins is widely patent. Local anesthesia was attained by infiltration with 1% lidocaine. Under real-time sonographic guidance, the anterior tibial vein was punctured with a 21 gauge micropuncture needle. Using standard technique, the needle was exchanged over a micro wire for a 5 [place] transitional micro sheath which was then exchanged over a short Amplatz wire for a short 6 [place] vascular sheath. Venography  was performed through the 6 [place] sheath. The anterior tibial vein is patent. There is either a patent channel through the popliteal vein, or a venous collateral running parallel with the popliteal vein which is patent. A wire was advanced through the popliteal vein. Attention was again directed to the popliteal fossa. Local anesthesia was attained by infiltration with 1% lidocaine. Under real-time sonographic guidance, the popliteal vein was  punctured with a 21 gauge micropuncture needle. Using standard technique, the needle was exchanged over a micro wire for a 5 [place] transitional micro sheath. The nerve she was performed. There appears to be a patent channel in the popliteal vein with chronic thrombus. Additional venography was performed of the right lower extremity. There are numerous collaterals throughout the right lower extremity. 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 Using a Glidewire and angled catheter the catheter was successfully advanced to the level of the lesser trochanter. Additional venography was performed. There is acute appearing thrombus in the femoral vein in the proximal thigh and common femoral vein at the femoral head. Thrombus extends into the profunda femoral vein. The catheter was successfully advanced into the iliac vein. 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 With some difficulty, the wire was eventually advanced beyond this occlusion and into the inferior vena cava. An inferior vena cavagram was performed. The inferior vena cava is patent. Inflow from the left iliac vein is not visualized. The catheter was brought back across the stenosis and venography performed confirming iliac vein compression with likely some chronic thrombus. A Rosen wire was advanced into the inferior vena cava. The 5 French angled catheter was removed. A 135 cm total length, 50 cm infusion length ultrasound assisted infusion catheter was advanced over the Rosen wire and positioned from the region of iliac vein compression into the femoral vein in the distal thigh. Thrombolysis was then initiated with tPA at 1 milligram/hour. FINDINGS: 1. 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 Acute thrombus within the femoral vein in the proximal thigh and the common femoral vein 4. 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 Lysis check tomorrow. Patient will likely require stent placement for iliac vein compression. Signed, Criselda Peaches, MD [facility] Radiology Electronically Signed   By: Jacqulynn Cadet M.D.   On: 10/03/2015 18:08   Ir Thrombect Veno Mech Mod Sed  Result Date: 10/04/2015 INDICATION: 65 year old female with 3 months history of left lower extremity pain and swelling in, recently diagnosed with extensive bilateral lower extremity DVT, however only her left lower extremity is symptomatic. Patient failed conservative management with hyper does a shin and as such weekend catheter directed thrombolysis on 10/03/2015. Patient presents today for repeat lower extremity venogram and potential intervention EXAM: 1. IR THROMBO FOLLOW-UP EVAL VENOUS SUBSEQUENT DAY 2. LEFT LOWER EXTREMITY VENOUS ANGIOPLASTY 3. FLUOROSCOPIC GUIDED DEPLOYMENT OF A VENOUS STENT. COMPARISON:  Initiation of catheter directed thrombolysis - 10/03/2015 MEDICATIONS: None CONTRAST:  130 cc Isovue 300 ANESTHESIA/SEDATION: None FLUOROSCOPY TIME:  11 minutes 24  seconds (123XX123 mGy) COMPLICATIONS: None immediate. TECHNIQUE: Informed written consent was obtained from the patient after a discussion of the risks, benefits and alternatives to treatment. Questions regarding the procedure were encouraged and answered. A timeout was performed prior to the initiation of the procedure. The patient was placed prone on the fluoroscopy table and the external portion of the left popliteal vein  approach vascular sheath as well as the surrounding skin were prepped and draped in the usual sterile fashion, and a sterile drape was applied covering the operative field. Maximum barrier sterile technique with sterile gowns and gloves were used for the procedure. Initial left lower extremity venogram was performed through the side arm of the vascular sheath. The infusion wire was removed and the infusion catheter was a cannulated with an exchange length Rosen wire which was advanced to the level of the cranial aspect of the IVC. A vertebral catheter was advanced into the caudal aspect of the IVC and pullback venograms were performed through the level of the left femoral head. Next, the 6 French popliteal vein approach vascular sheath was exchanged for a 10 [place] vascular sheath which was advanced to the level of the mid femoral aspect of the left superficial femoral vein. Mechanical thrombectomy was performed with the use of an Angiojet device from the level of the left common and external iliac veins extending to the left common femoral vein. Prolonged balloon angioplasty was performed at multiple stations beginning at the level of the mid left superficial femoral vein extending to the left common iliac veins confluence with the IVC. Post angioplasty venograms were performed. Next, an 16 x 90 mm Wall stent was deployed extending from the caudal aspect of the IVC to the mid aspect of the left external iliac vein. The stent was angioplastied at multiple stations with a 14 mm x 4 cm Atlas angioplasty balloon. Post stent deployment venogram images were performed through the vascular sheath. Mechanical thrombectomy was again performed with the use of an Angiojet device at location of a minimal amount of residual nonocclusive thrombus at the confluence of the left deep and superficial femoral veins. Prolonged balloon angioplasty was again performed at multiple stations within 8 mm x 8 cm Mustang Balloon extending from  the level of the mid femoral vein to the peripheral aspect of the venous stent. Completion left lower extremity central and pelvic venogram images were obtained. Images were reviewed and the procedure was terminated. All wires, catheters and sheaths were removed from the patient. Hemostasis was achieved at both the left popliteal and calf venous access site with manual compression. Dressings were placed. The patient tolerated the procedure well without immediate postprocedural complication. FINDINGS: Initial left lower extremity venogram demonstrates wide patency of the cranial aspect of the left popliteal vein as well as the distal aspect of the left superficial femoral vein. 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 Pelvic venogram demonstrates improved patency of both the left common and external iliac veins with a minimal amount of residual nonocclusive thrombus within the peripheral aspect of the left external iliac vein. 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 Successful prolonged balloon angioplasty of the mid / proximal aspects of the left femoral vein as well as the left common femoral vein with post angioplasty venogram images  demonstrating restored antegrade flow from the level of the mid left femoral vein to the IVC. Severe narrowing of the origin of the left common iliac vein was successfully treated with deployment of 16 mm x 9 cm Wall stent which was angioplastied to 14 mm diameter. Completion images demonstrate restored patency and antegrade flow from the level of the left mid femoral vein to the IVC. IMPRESSION: 1. Technically successful catheter directed thrombolysis for suspected acute on chronic left lower extremity DVT with restored antegrade flow  from the level of the mid aspect of the left femoral vein to the IVC. 2. 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 PLAN: - Bed rest for 2 hours with left lower extremity maintain flat. Then may ambulate as tolerated. - Would continue IV heparin until patient is therapeutically anticoagulated with Lovenox. - Would recommend Lovenox anti coagulation for at least the next 4-6 weeks prior to converting to an oral anticoagulant. - Would recommend bilateral thigh-high compression stockings can at least 6 months of anti coagulation. - The patient will be given an appointment to be evaluated at the interventional radiology clinic [phone]) in approximately 4 weeks at which time a bilateral lower extremity venous Doppler ultrasound will be obtained. Electronically Signed   By: Sandi Mariscal M.D.   On: 10/04/2015 17:37   Ir US Guide Vasc Access Left  Result Date: 10/03/2015 INDICATION: 65 year old female with a 3 month history of left lower extremity pain and swelling. She was recently diagnosed with extensive bilateral lower extremity DVT. However, only the left lower extremity is symptomatic. She has not experienced relief despite several days of heparinization. She presents for catheter directed thrombolysis. EXAM: IR INFUSION THROMBOL VENOUS INITIAL (MS); IR ULTRASOUND GUIDANCE VASC ACCESS LEFT; LEFT EXTREMITY VENOGRAPHY; INFERIOR VENA CAVOGRAM 1. Ultrasound-guided access anterior tibial vein 2. Left lower extremity venogram 3. Ultrasound-guided access popliteal vein 4. Inferior vena cavagram 5. Initiation of venous thrombolysis COMPARISON:  None. MEDICATIONS: None. ANESTHESIA/SEDATION: Versed 1.5 mg IV; Fentanyl 75 mcg IV Moderate Sedation Time:  42 minutes The patient was continuously monitored during the procedure by the interventional radiology nurse under my direct supervision. FLUOROSCOPY TIME:  Fluoroscopy Time: 8 minutes 42  seconds (269 mGy). COMPLICATIONS: None immediate. TECHNIQUE: Informed written consent was obtained from the patient after a thorough discussion of the procedural risks, benefits and alternatives. All questions were addressed. Maximal Sterile Barrier Technique was utilized including caps, mask, sterile gowns, sterile gloves, sterile drape, hand hygiene and skin antiseptic. A timeout was performed prior to the initiation of the procedure. Ultrasound was used to interrogate the popliteal fossa. The popliteal vein appears completely thrombosed by ultrasound and is noncompressible. Ultrasound was used to interrogate the posterior tibial veins. The posterior tibial veins could not be identified and may be chronically occluded. Ultrasound was used to interrogate the anterior tibial vein. At least 1 of the paired anterior tibial veins is widely patent. Local anesthesia was attained by infiltration with 1% lidocaine. Under real-time sonographic guidance, the anterior tibial vein was punctured with a 21 gauge micropuncture needle. Using standard technique, the needle was exchanged over a micro wire for a 5 [place] transitional micro sheath which was then exchanged over a short Amplatz wire for a short 6 [place] vascular sheath. Venography was performed through the 6 French sheath. The anterior tibial vein is patent. There is either a patent channel through the popliteal vein, or a venous collateral running parallel with  the popliteal vein which is patent. A wire was advanced through the popliteal vein. Attention was again directed to the popliteal fossa. Local anesthesia was attained by infiltration with 1% lidocaine. Under real-time sonographic guidance, the popliteal vein was punctured with a 21 gauge micropuncture needle. Using standard technique, the needle was exchanged over a micro wire for a 5 [place] transitional micro sheath. The nerve she was performed. There appears to be a patent channel in the popliteal vein with  chronic thrombus. Additional venography was performed of the right lower extremity. There are numerous collaterals throughout the right lower extremity. 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 Using a Glidewire and angled catheter the catheter was successfully advanced to the level of the lesser trochanter. Additional venography was performed. There is acute appearing thrombus in the femoral vein in the proximal thigh and common femoral vein at the femoral head. Thrombus extends into the profunda femoral vein. The catheter was successfully advanced into the iliac vein. 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 With some difficulty, the wire was eventually advanced beyond this occlusion and into the inferior vena cava. An inferior vena cavagram was performed. The inferior vena cava is patent. Inflow from the left iliac vein is not visualized. The catheter was brought back across the stenosis and venography performed confirming iliac vein compression with likely some chronic thrombus. A Rosen wire was advanced into the inferior vena cava. The 5 French angled catheter was removed. A 135 cm total length, 50 cm infusion length ultrasound assisted infusion catheter was advanced over the Rosen wire and positioned from the region of iliac vein compression into the femoral vein in the distal thigh. Thrombolysis was then initiated with tPA at 1 milligram/hour. FINDINGS: 1. 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 Acute thrombus within the femoral vein in the proximal thigh and the common femoral vein 4. 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 Lysis check tomorrow. Patient will likely require stent placement for iliac vein compression. Signed, Criselda Peaches, MD [facility] Radiology Electronically Signed   By: Jacqulynn Cadet M.D.   On: 10/03/2015 18:08   Ir US Guide Vasc Access Left  Result Date: 10/03/2015 INDICATION: 65 year old female with a 3 month history of left lower extremity pain and swelling. She was recently diagnosed with extensive bilateral lower extremity DVT. However, only the left lower extremity is symptomatic. She has not experienced relief despite several days of heparinization. She presents for catheter directed thrombolysis. EXAM: IR INFUSION THROMBOL VENOUS INITIAL (MS); IR ULTRASOUND GUIDANCE VASC ACCESS LEFT; LEFT EXTREMITY VENOGRAPHY; INFERIOR VENA CAVOGRAM 1. Ultrasound-guided access anterior tibial vein 2. Left lower extremity venogram 3. Ultrasound-guided access popliteal vein 4. Inferior vena cavagram 5. Initiation of venous thrombolysis COMPARISON:  None. MEDICATIONS: None. ANESTHESIA/SEDATION: Versed 1.5 mg IV; Fentanyl 75 mcg IV Moderate Sedation Time:  42 minutes The patient was continuously monitored during the procedure by the interventional radiology nurse under my direct supervision. FLUOROSCOPY TIME:  Fluoroscopy Time: 8 minutes 42  seconds (269 mGy). COMPLICATIONS: None immediate. TECHNIQUE: Informed written consent was obtained from the patient after a thorough discussion of the procedural risks, benefits and alternatives. All questions were addressed. Maximal Sterile Barrier Technique was utilized including caps, mask, sterile gowns, sterile gloves, sterile drape, hand hygiene and skin antiseptic. A timeout was performed prior to the initiation of the procedure. Ultrasound was  used to interrogate the popliteal fossa. The popliteal vein appears completely thrombosed by ultrasound and is noncompressible. Ultrasound was used to interrogate the posterior tibial veins. The posterior tibial veins could not be identified and may be chronically occluded. Ultrasound was used to interrogate the anterior tibial vein. At least 1 of the paired anterior tibial veins is widely patent. Local anesthesia was attained by infiltration with 1% lidocaine. Under real-time sonographic guidance, the anterior tibial vein was punctured with a 21 gauge micropuncture needle. Using standard technique, the needle was exchanged over a micro wire for a 5 [place] transitional micro sheath which was then exchanged over a short Amplatz wire for a short 6 [place] vascular sheath. Venography was performed through the 6 French sheath. The anterior tibial vein is patent. There is either a patent channel through the popliteal vein, or a venous collateral running parallel with the popliteal vein which is patent. A wire was advanced through the popliteal vein. Attention was again directed to the popliteal fossa. Local anesthesia was attained by infiltration with 1% lidocaine. Under real-time sonographic guidance, the popliteal vein was punctured with a 21 gauge micropuncture needle. Using standard technique, the needle was exchanged over a micro wire for a 5 [place] transitional micro sheath. The nerve she was performed. There appears to be a patent channel in the popliteal vein with chronic thrombus. Additional venography was performed of the right lower extremity. There are numerous collaterals throughout the right lower extremity. 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 Using a Glidewire and angled catheter the catheter was successfully advanced to the level of the lesser trochanter. Additional venography was performed. There is acute appearing thrombus in the femoral vein in the proximal  thigh and common femoral vein at the femoral head. Thrombus extends into the profunda femoral vein. The catheter was successfully advanced into the iliac vein. 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 With some difficulty, the wire was eventually advanced beyond this occlusion and into the inferior vena cava. An inferior vena cavagram was performed. The inferior vena cava is patent. Inflow from the left iliac vein is not visualized. The catheter was brought back across the stenosis and venography performed confirming iliac vein compression with likely some chronic thrombus. A Rosen wire was advanced into the inferior vena cava. The 5 French angled catheter was removed. A 135 cm total length, 50 cm infusion length ultrasound assisted infusion catheter was advanced over the Rosen wire and positioned from the region of iliac vein compression into the femoral vein in the distal thigh. Thrombolysis was then initiated with tPA at 1 milligram/hour. FINDINGS: 1. 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 Acute thrombus within the femoral vein in the proximal thigh and the common femoral vein 4. 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 Lysis check tomorrow. Patient will likely require stent placement for iliac vein compression. Signed, Criselda Peaches, MD [facility] Radiology Electronically Signed   By: Jacqulynn Cadet M.D.   On: 10/03/2015 18:08   Ir  [employer] Stent 1st Art Not Therese Sarah Car Vert Car  Result Date: 10/04/2015 INDICATION: 65 year old female with 3 months history of left lower extremity pain and swelling in, recently diagnosed with extensive bilateral lower extremity DVT, however only her left lower extremity is symptomatic. Patient failed conservative management with hyper does a shin and as such weekend catheter directed thrombolysis on 10/03/2015. Patient presents today for repeat lower extremity venogram and potential intervention EXAM: 1. IR THROMBO FOLLOW-UP EVAL VENOUS SUBSEQUENT DAY 2. LEFT LOWER EXTREMITY VENOUS ANGIOPLASTY 3. FLUOROSCOPIC GUIDED DEPLOYMENT OF A VENOUS STENT. COMPARISON:  Initiation of catheter directed thrombolysis - 10/03/2015 MEDICATIONS: None CONTRAST:  130 cc Isovue 300 ANESTHESIA/SEDATION: None FLUOROSCOPY TIME:  11 minutes 24 seconds (123XX123 mGy) COMPLICATIONS: None immediate. TECHNIQUE: Informed written consent was obtained from the patient after a discussion of the risks, benefits and alternatives to treatment. Questions regarding the procedure were encouraged and answered. A timeout was performed prior to the initiation of the procedure. The patient was placed prone on the fluoroscopy table and the external portion of the left popliteal vein approach vascular sheath as well as the surrounding skin were prepped and draped in the usual sterile fashion, and a sterile drape was applied covering the operative field. Maximum barrier sterile technique with sterile gowns and gloves were used for the procedure. Initial left lower extremity venogram was performed through the side arm of the vascular sheath. The infusion wire was removed and the infusion catheter was a cannulated with an exchange length Rosen wire which was advanced to the level of the cranial aspect of the IVC. A vertebral catheter was advanced into the caudal aspect of the IVC and pullback venograms were performed through the level of the left femoral  head. Next, the 6 French popliteal vein approach vascular sheath was exchanged for a 10 [place] vascular sheath which was advanced to the level of the mid femoral aspect of the left superficial femoral vein. Mechanical thrombectomy was performed with the use of an Angiojet device from the level of the left common and external iliac veins extending to the left common femoral vein. Prolonged balloon angioplasty was performed at multiple stations beginning at the level of the mid left superficial femoral vein extending to the left common iliac veins confluence with the IVC. Post angioplasty venograms were performed. Next, an 16 x 90 mm Wall stent was deployed extending from the caudal aspect of the IVC to the mid aspect of the left external iliac vein. The stent was angioplastied at multiple stations with a 14 mm x 4 cm Atlas angioplasty balloon. Post stent deployment venogram images were performed through the vascular sheath. Mechanical thrombectomy was again performed with the use of an Angiojet device at location of a minimal amount of residual nonocclusive thrombus at the confluence of the left deep and superficial femoral veins. Prolonged balloon angioplasty was again performed at multiple stations within 8 mm x 8 cm Mustang Balloon extending from the level of the mid femoral vein to the peripheral aspect of the venous stent. Completion left lower extremity central and pelvic venogram images were obtained. Images  were reviewed and the procedure was terminated. All wires, catheters and sheaths were removed from the patient. Hemostasis was achieved at both the left popliteal and calf venous access site with manual compression. Dressings were placed. The patient tolerated the procedure well without immediate postprocedural complication. FINDINGS: Initial left lower extremity venogram demonstrates wide patency of the cranial aspect of the left popliteal vein as well as the distal aspect of the left superficial femoral  vein. 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 Pelvic venogram demonstrates improved patency of both the left common and external iliac veins with a minimal amount of residual nonocclusive thrombus within the peripheral aspect of the left external iliac vein. 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 Successful prolonged balloon angioplasty of the mid / proximal aspects of the left femoral vein as well as the left common femoral vein with post angioplasty venogram images demonstrating restored antegrade flow from the level of the mid left femoral vein to the IVC. Severe narrowing of the origin of the left common iliac vein was successfully treated with deployment of 16 mm x 9 cm Wall stent which was angioplastied to 14 mm diameter. Completion images demonstrate restored patency and antegrade flow from the level of the left mid femoral vein to the IVC. IMPRESSION: 1. 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 2. 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 PLAN: - Bed rest for 2 hours with left lower extremity maintain flat. Then may ambulate as tolerated. - Would continue IV heparin until patient is therapeutically anticoagulated with Lovenox. - Would recommend Lovenox anti coagulation for at least the next 4-6 weeks prior to converting to an oral anticoagulant. - Would recommend bilateral thigh-high compression stockings can at least 6 months of anti coagulation. - The patient will be given an appointment to be evaluated at the  interventional radiology clinic [phone]) in approximately 4 weeks at which time a bilateral lower extremity venous Doppler ultrasound will be obtained. Electronically Signed   By: Sandi Mariscal M.D.   On: 10/04/2015 17:37   Ir Infusion Thrombol Venous Initial (ms)  Result Date: 10/03/2015 INDICATION: 65 year old female with a 3 month history of left lower extremity pain and swelling. She was recently diagnosed with extensive bilateral lower extremity DVT. However, only the left lower extremity is symptomatic. She has not experienced relief despite several days of heparinization. She presents for catheter directed thrombolysis. EXAM: IR INFUSION THROMBOL VENOUS INITIAL (MS); IR ULTRASOUND GUIDANCE VASC ACCESS LEFT; LEFT EXTREMITY VENOGRAPHY; INFERIOR VENA CAVOGRAM 1. Ultrasound-guided access anterior tibial vein 2. Left lower extremity venogram 3. Ultrasound-guided access popliteal vein 4. Inferior vena cavagram 5. Initiation of venous thrombolysis COMPARISON:  None. MEDICATIONS: None. ANESTHESIA/SEDATION: Versed 1.5 mg IV; Fentanyl 75 mcg IV Moderate Sedation Time:  42 minutes The patient was continuously monitored during the procedure by the interventional radiology nurse under my direct supervision. FLUOROSCOPY TIME:  Fluoroscopy Time: 8 minutes 42 seconds (269 mGy). COMPLICATIONS: None immediate. TECHNIQUE: Informed written consent was obtained from the patient after a thorough discussion of the procedural risks, benefits  and alternatives. All questions were addressed. Maximal Sterile Barrier Technique was utilized including caps, mask, sterile gowns, sterile gloves, sterile drape, hand hygiene and skin antiseptic. A timeout was performed prior to the initiation of the procedure. Ultrasound was used to interrogate the popliteal fossa. The popliteal vein appears completely thrombosed by ultrasound and is noncompressible. Ultrasound was used to interrogate the posterior tibial veins. The posterior tibial veins  could not be identified and may be chronically occluded. Ultrasound was used to interrogate the anterior tibial vein. At least 1 of the paired anterior tibial veins is widely patent. Local anesthesia was attained by infiltration with 1% lidocaine. Under real-time sonographic guidance, the anterior tibial vein was punctured with a 21 gauge micropuncture needle. Using standard technique, the needle was exchanged over a micro wire for a 5 [place] transitional micro sheath which was then exchanged over a short Amplatz wire for a short 6 [place] vascular sheath. Venography was performed through the 6 French sheath. The anterior tibial vein is patent. There is either a patent channel through the popliteal vein, or a venous collateral running parallel with the popliteal vein which is patent. A wire was advanced through the popliteal vein. Attention was again directed to the popliteal fossa. Local anesthesia was attained by infiltration with 1% lidocaine. Under real-time sonographic guidance, the popliteal vein was punctured with a 21 gauge micropuncture needle. Using standard technique, the needle was exchanged over a micro wire for a 5 [place] transitional micro sheath. The nerve she was performed. There appears to be a patent channel in the popliteal vein with chronic thrombus. Additional venography was performed of the right lower extremity. There are numerous collaterals throughout the right lower extremity. 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 Using a Glidewire and angled catheter the catheter was successfully advanced to the level of the lesser trochanter. Additional venography was performed. There is acute appearing thrombus in the femoral vein in the proximal thigh and common femoral vein at the femoral head. Thrombus extends into the profunda femoral vein. The catheter was successfully advanced into the iliac vein. 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 With some difficulty, the wire was eventually advanced beyond this occlusion and into the inferior vena cava. An inferior vena cavagram was performed. The inferior vena cava is patent. Inflow from the left iliac vein is not visualized. The catheter was brought back across the stenosis and venography performed confirming iliac vein compression with likely some chronic thrombus. A Rosen wire was advanced into the inferior vena cava. The 5 French angled catheter was removed. A 135 cm total length, 50 cm infusion length ultrasound assisted infusion catheter was advanced over the Rosen wire and positioned from the region of iliac vein compression into the femoral vein in the distal thigh. Thrombolysis was then initiated with tPA at 1 milligram/hour. FINDINGS: 1. 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 Acute thrombus within the femoral vein in the proximal thigh and the common femoral vein 4. 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  Lysis check tomorrow. Patient will likely require stent placement for iliac vein compression. Signed, Criselda Peaches, MD [facility] Radiology Electronically Signed   By: Jacqulynn Cadet M.D.   On: 10/03/2015 18:08   Ir Jacolyn Reedy F/u Eval Art/ven Final Day (ms)  Result Date: 10/04/2015 INDICATION: 65 year old female with 3 months history of left lower extremity pain and swelling in, recently diagnosed with extensive bilateral lower extremity  DVT, however only her left lower extremity is symptomatic. Patient failed conservative management with hyper does a shin and as such weekend catheter directed thrombolysis on 10/03/2015. Patient presents today for repeat lower extremity venogram and potential intervention EXAM: 1. IR THROMBO FOLLOW-UP EVAL VENOUS SUBSEQUENT DAY 2. LEFT LOWER EXTREMITY VENOUS ANGIOPLASTY 3. FLUOROSCOPIC GUIDED DEPLOYMENT OF A VENOUS STENT. COMPARISON:  Initiation of catheter directed thrombolysis - 10/03/2015 MEDICATIONS: None CONTRAST:  130 cc Isovue 300 ANESTHESIA/SEDATION: None FLUOROSCOPY TIME:  11 minutes 24 seconds (123XX123 mGy) COMPLICATIONS: None immediate. TECHNIQUE: Informed written consent was obtained from the patient after a discussion of the risks, benefits and alternatives to treatment. Questions regarding the procedure were encouraged and answered. A timeout was performed prior to the initiation of the procedure. The patient was placed prone on the fluoroscopy table and the external portion of the left popliteal vein approach vascular sheath as well as the surrounding skin were prepped and draped in the usual sterile fashion, and a sterile drape was applied covering the operative field. Maximum barrier sterile technique with sterile gowns and gloves were used for the procedure. Initial left lower extremity venogram was performed through the side arm of the vascular sheath. The infusion wire was removed and the infusion catheter was a cannulated with an exchange length Rosen wire which was advanced to the level of the cranial aspect of the IVC. A vertebral catheter was advanced into the caudal aspect of the IVC and pullback venograms were performed through the level of the left femoral head. Next, the 6 French popliteal vein approach vascular sheath was exchanged for a 10 [place] vascular sheath which was advanced to the level of the mid femoral aspect of the left superficial femoral vein. Mechanical thrombectomy was  performed with the use of an Angiojet device from the level of the left common and external iliac veins extending to the left common femoral vein. Prolonged balloon angioplasty was performed at multiple stations beginning at the level of the mid left superficial femoral vein extending to the left common iliac veins confluence with the IVC. Post angioplasty venograms were performed. Next, an 16 x 90 mm Wall stent was deployed extending from the caudal aspect of the IVC to the mid aspect of the left external iliac vein. The stent was angioplastied at multiple stations with a 14 mm x 4 cm Atlas angioplasty balloon. Post stent deployment venogram images were performed through the vascular sheath. Mechanical thrombectomy was again performed with the use of an Angiojet device at location of a minimal amount of residual nonocclusive thrombus at the confluence of the left deep and superficial femoral veins. Prolonged balloon angioplasty was again performed at multiple stations within 8 mm x 8 cm Mustang Balloon extending from the level of the mid femoral vein to the peripheral aspect of the venous stent. Completion left lower extremity central and pelvic venogram images were obtained. Images were reviewed and the procedure was terminated. All wires, catheters and sheaths were removed from the patient. Hemostasis was achieved at both the left popliteal and calf venous  access site with manual compression. Dressings were placed. The patient tolerated the procedure well without immediate postprocedural complication. FINDINGS: Initial left lower extremity venogram demonstrates wide patency of the cranial aspect of the left popliteal vein as well as the distal aspect of the left superficial femoral vein. 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 Pelvic venogram  demonstrates improved patency of both the left common and external iliac veins with a minimal amount of residual nonocclusive thrombus within the peripheral aspect of the left external iliac vein. 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 Successful prolonged balloon angioplasty of the mid / proximal aspects of the left femoral vein as well as the left common femoral vein with post angioplasty venogram images demonstrating restored antegrade flow from the level of the mid left femoral vein to the IVC. Severe narrowing of the origin of the left common iliac vein was successfully treated with deployment of 16 mm x 9 cm Wall stent which was angioplastied to 14 mm diameter. Completion images demonstrate restored patency and antegrade flow from the level of the left mid femoral vein to the IVC. IMPRESSION: 1. 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 2. 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 PLAN: - Bed rest for 2 hours with left lower extremity maintain flat. Then may ambulate as tolerated. - Would continue IV heparin until patient is therapeutically anticoagulated with Lovenox. - Would recommend Lovenox anti coagulation for at least the next 4-6 weeks prior to converting to an oral anticoagulant. - Would recommend bilateral thigh-high compression stockings can at least 6 months of anti coagulation. - The patient will be given an appointment to be evaluated at the interventional radiology clinic [phone]) in approximately 4 weeks at which time a bilateral lower extremity venous Doppler ultrasound will be obtained. Electronically Signed   By: Sandi Mariscal M.D.   On: 10/04/2015 17:37     Labs:  CBC:  Recent Labs  10/03/15 1827 10/04/15 0030 10/04/15 1200 10/05/15 0627  WBC 5.6 5.7 5.7 7.8  HGB 11.1* 11.3* 10.8* 11.9*  HCT 34.9* 35.3* 33.6* 36.9  PLT 229 217 189 180    COAGS:  Recent Labs  09/30/15 1912  10/03/15 0329 10/03/15 1046 10/03/15 1851 10/03/15 2040 10/04/15 0500  INR 1.10  --  1.86  --   --   --   --   APTT  --   < > 88* 103* >200* 45* 36  < > = values in this interval not displayed.  BMP:  Recent Labs  10/01/15 0100 10/02/15 0212 10/03/15 0329 10/04/15 0500  NA 137 137 133* 137  K 3.5 3.9 4.4 4.0  CL 104 102 99* 99*  CO2 26 26 26 30   GLUCOSE 265* 177* 205* 157*  BUN 13 15 15 12   CALCIUM 9.3 9.2 9.7 9.4  CREATININE 1.07* 1.27* 1.16* 0.91  GFRNONAA 53* 43* 48* >60  GFRAA >60 50* 56* >60    LIVER FUNCTION TESTS:  Recent Labs  05/06/15 1843 09/30/15 1659 10/03/15 0329  BILITOT 0.7 0.6 0.5  AST  25 19 20   ALT 24 15 16   ALKPHOS 55 78 62  PROT 7.7 8.5* 7.8  ALBUMIN 3.7 3.7 3.4*    Assessment and Plan: 1. S/p DVT lysis of LLE with angioplasty and stenting of the L common/external iliac vein. -per Dr. Pascal Lux, recommend Lovenox for the next 4-6 weeks prior to switching to an oral anticoagulant. -bilateral thigh-high compression stockings for at least 6 months -she will follow up with Dr. Pascal Lux in the clinic in 4 weeks with BLE venous doppler US. -please call with further questions.  Electronically Signed: Henreitta Cea 10/05/2015, 10:06 AM   I spent a total of 15 Minutes at the the patient's bedside AND on the patient's hospital floor or unit, greater than 50% of which was counseling/coordinating care for LLE DVT

## 2015-10-05 NOTE — Care Management Important Message (Signed)
Important Message  Patient Details  Name: [name] MRN: [id number] Date of Birth: 11-07-1950   Medicare Important Message Given:  Yes    Nathen May 10/05/2015, 10:33 AM

## 2015-10-06 DIAGNOSIS — Z95828 Presence of other vascular implants and grafts: Secondary | ICD-10-CM

## 2015-10-06 LAB — GLUCOSE, CAPILLARY
GLUCOSE-CAPILLARY: 134 mg/dL — AB (ref 65–99)
GLUCOSE-CAPILLARY: 224 mg/dL — AB (ref 65–99)
GLUCOSE-CAPILLARY: 231 mg/dL — AB (ref 65–99)
Glucose-Capillary: 225 mg/dL — ABNORMAL HIGH (ref 65–99)

## 2015-10-06 LAB — CBC
HEMATOCRIT: 34.9 % — AB (ref 36.0–46.0)
Hemoglobin: 11.2 g/dL — ABNORMAL LOW (ref 12.0–15.0)
MCH: 29.7 pg (ref 26.0–34.0)
MCHC: 32.1 g/dL (ref 30.0–36.0)
MCV: 92.6 fL (ref 78.0–100.0)
PLATELETS: 197 10*3/uL (ref 150–400)
RBC: 3.77 MIL/uL — ABNORMAL LOW (ref 3.87–5.11)
RDW: 15.5 % (ref 11.5–15.5)
WBC: 7.4 10*3/uL (ref 4.0–10.5)

## 2015-10-06 LAB — RENAL FUNCTION PANEL
ALBUMIN: 3.2 g/dL — AB (ref 3.5–5.0)
Anion gap: 6 (ref 5–15)
BUN: 16 mg/dL (ref 6–20)
CHLORIDE: 98 mmol/L — AB (ref 101–111)
CO2: 31 mmol/L (ref 22–32)
CREATININE: 1.08 mg/dL — AB (ref 0.44–1.00)
Calcium: 9.5 mg/dL (ref 8.9–10.3)
GFR calc Af Amer: >60 mL/min (ref >60)
GFR calc non Af Amer: 53 mL/min — ABNORMAL LOW (ref >60)
Glucose, Bld: 159 mg/dL — ABNORMAL HIGH (ref 65–99)
POTASSIUM: 3.9 mmol/L (ref 3.5–5.1)
Phosphorus: 3.2 mg/dL (ref 2.5–4.6)
Sodium: 135 mmol/L (ref 135–145)

## 2015-10-06 LAB — HEPARIN LEVEL (UNFRACTIONATED): Heparin Unfractionated: 0.3 IU/mL (ref 0.30–0.70)

## 2015-10-06 LAB — MAGNESIUM: MAGNESIUM: 2.1 mg/dL (ref 1.7–2.4)

## 2015-10-06 MED ORDER — ENOXAPARIN SODIUM 120 MG/0.8ML ~~LOC~~ SOLN
1.0000 mg/kg | Freq: Two times a day (BID) | SUBCUTANEOUS | Status: DC
  Administered 2015-10-06 – 2015-10-07 (×3): 115 mg via SUBCUTANEOUS
  Filled 2015-10-06 (×4): qty 0.76

## 2015-10-06 MED ORDER — POLYETHYLENE GLYCOL 3350 17 G PO PACK: 17.0000 g | PACK | Freq: Every day | ORAL | Status: DC | PRN

## 2015-10-06 MED ORDER — ATORVASTATIN CALCIUM 40 MG PO TABS
40.0000 mg | ORAL_TABLET | Freq: Every day | ORAL | Status: DC
  Administered 2015-10-06: 40 mg via ORAL
  Filled 2015-10-06 (×2): qty 1

## 2015-10-06 NOTE — Progress Notes (Signed)
ANTICOAGULATION CONSULT NOTE - Follow Up Consult  Pharmacy Consult for lovenox Indication: DVT  Allergies  Allergen Reactions  . Metformin And Related Itching, Swelling and Other (See Comments)    Leg pain & swelling in legs  . Atorvastatin Itching and Rash  . Levofloxacin Itching    Patient Measurements: Height: 5\' 4"  (162.6 cm) Weight: 250 lb (113.4 kg) IBW/kg (Calculated) : 54.7  Vital Signs: Temp: 98.6 F (37 C) (09/28 0725) Temp Source: Oral (09/28 0725) BP: 112/52 (09/28 0800) Pulse Rate: 58 (09/28 0800)  Labs:  Recent Labs  10/03/15 1851  10/03/15 2040  10/04/15 0500  10/04/15 0830 10/04/15 1200 10/04/15 1753  10/05/15 0627 10/05/15 1134 10/06/15 0217  HGB  --   --   --   < >  --   --   --  10.8*  --   --  11.9*  --  11.2*  HCT  --   --   --   < >  --   --   --  33.6*  --   --  36.9  --  34.9*  PLT  --   --   --   < >  --   --   --  189  --   --  180  --  197  APTT >200*  --  45*  --  36  --   --   --   --   --   --   --   --   HEPARINUNFRC  --   < >  --   --   --   < >  --  0.45  --   < > 0.50 0.45 0.30  CREATININE  --   --   --   --  0.91  --   --   --   --   --   --   --  1.08*  TROPONINI  --   --   --   --   --   --  <0.03  --  <0.03  --   --   --   --   < > = values in this interval not displayed.  Estimated Creatinine Clearance: 64.1 mL/min (by C-G formula based on SCr of 1.08 mg/dL (H)).  Assessment: VW is a 65 yo female with extensive DVT on xarelto PTA who was transitioned to heparin at admission. She is s/p DVT lysis with angioplasty and stenting. Cardiology recommends lovenox for the next 4-6 weeks before switching to oral anticoagulation. Pharmacy consulted to transition to lovenox. Hgb and pltc stable. No bleeding noted.   Goal of Therapy:  Monitor platelets by anticoagulation protocol: Yes   Plan:  Lovenox 115mg  every 12 hours Wait one hour after heparin gtt is off to start lovenox. Monitor CBC q72h  Dierdre Harness, BS,  PharmD Clinical Pharmacy Resident [phone] (Pager) 10/06/2015 8:26 AM

## 2015-10-06 NOTE — Progress Notes (Signed)
Occupational Therapy Treatment Patient Details Name: BULAR HIRAOKA MRN: [id number] DOB: Oct 22, 1950 Today's Date: 10/06/2015    History of present illness 65 y.o. female with history of obesity, CHF/ICM on home oxygen therapy, CAD, CABG/ICD, diabetes, hypertension, obstructive sleep apnea, hyperlipidemia. Pt with LLE pain, Doppler which revealed thrombus in the femoral, profunda, popliteal and greater saphenous veins. S/p DVT lysis of LLE with angioplasty and stenting of the L common/external iliac vein. on 9/25   OT comments  Pt making good progress with adls. Pt donned and doffed ted hose (required for 8 weeks) at EOB with use of step stool and VCs only. Pt min guard for most adl mobility.  Will continue to follow.   Follow Up Recommendations  Supervision - Intermittent    Equipment Recommendations  Tub/shower bench    Recommendations for Other Services      Precautions / Restrictions Precautions Precautions: Fall Restrictions Weight Bearing Restrictions: No       Mobility Bed Mobility Overal bed mobility: Needs Assistance Bed Mobility: Supine to Sit     Supine to sit: Min assist Sit to supine: Supervision   General bed mobility comments: Pt required a hand to get fully into sitting.  Transfers Overall transfer level: Needs assistance Equipment used: 1 person hand held assist Transfers: Sit to/from [facility] Sit to Stand: Min guard Stand pivot transfers: Min guard       General transfer comment: cues for hand placement and safety    Balance Overall balance assessment: Needs assistance Sitting-balance support: Feet supported Sitting balance-Leahy Scale: Good     Standing balance support: Bilateral upper extremity supported;During functional activity Standing balance-Leahy Scale: Fair Standing balance comment: Pt could let go at sink for short amounts of time.                   ADL Overall ADL's : Needs  assistance/impaired Eating/Feeding: Independent;Sitting   Grooming: Wash/dry hands;Wash/dry face;Oral care;Supervision/safety;Standing Grooming Details (indicate cue type and reason): Pt took a few steps to sink with HHA and min guard.             Lower Body Dressing: Minimal assistance Lower Body Dressing Details (indicate cue type and reason): Pt instructed on how to donn long ted hose which she is required to wear for 8 weeks.  Pt was able to donn and doff ted hose with VCs only by propping legs up on the bed and with the use of a small step stool. Pt states she does not know if someone will be around at all times to help her so she was relieved to find out she could do it; although time consuming. Toilet Transfer: Designer, fashion/clothing and Hygiene: Engineer, site Details (indicate cue type and reason): Spoke at length about showering. Pt has been sponge bathing b/c of fear of stepping over the side of the tub.  Rec tub bench to pt for safety and she agreed this would take the fear out of stepping over the side of the tub. Functional mobility during ADLs: Min guard General ADL Comments: Pain better in LEs and pt doing better with LE adls.        Vision                     Perception     Praxis      Cognition   Behavior During Therapy: Flat affect Overall Cognitive Status: Within Functional Limits  for tasks assessed                       Extremity/Trunk Assessment               Exercises General Exercises - Lower Extremity Long Arc Quad: AROM;Both;Seated;15 reps Hip ABduction/ADduction: AROM;Both;Seated;15 reps Hip Flexion/Marching: AROM;15 reps;Both;Seated Toe Raises: AROM;15 reps;Both;Seated   Shoulder Instructions       General Comments      Pertinent Vitals/ Pain       Pain Assessment: 0-10 Pain Score: 0-No pain Pain Location: left knee Pain Descriptors / Indicators: Aching Pain  Intervention(s): Limited activity within patient's tolerance;Repositioned;Monitored during session  Home Living                                          Prior Functioning/Environment              Frequency  Min 2X/week        Progress Toward Goals  OT Goals(current goals can now be found in the care plan section)  Progress towards OT goals: Progressing toward goals  Acute Rehab OT Goals Patient Stated Goal: go home OT Goal Formulation: With patient/family Time For Goal Achievement: 10/10/15 Potential to Achieve Goals: Good ADL Goals Pt Will Perform Grooming: with set-up;with supervision;standing Pt Will Perform Upper Body Bathing: with set-up;with modified independence Pt Will Perform Lower Body Bathing: with min guard assist;with supervision;with set-up Pt Will Perform Upper Body Dressing: with set-up;with modified independence Pt Will Perform Lower Body Dressing: with min guard assist;with supervision;with set-up Pt Will Transfer to Toilet: with supervision;with modified independence;regular height toilet;ambulating Pt Will Perform Toileting - Clothing Manipulation and hygiene: with supervision;sitting/lateral leans;sit to/from stand Pt Will Perform Tub/Shower Transfer: tub bench;rolling walker;ambulating;with supervision;with modified independence  Plan Discharge plan remains appropriate    Co-evaluation                 End of Session     Activity Tolerance Patient tolerated treatment well   Patient Left in bed;with call bell/phone within reach;with family/visitor present   Nurse Communication Mobility status        Time: [id number] OT Time Calculation (min): 25 min  Charges: OT Treatments $Self Care/Home Management : 23-37 mins  Glenford Peers 10/06/2015, 1:00 PM  [phone]

## 2015-10-06 NOTE — Progress Notes (Signed)
Physical Therapy Treatment Patient Details Name: [name] MRN: [id number] DOB: 1950-11-12 Today's Date: 10/06/2015    History of Present Illness 65 y.o. female with history of obesity, CHF/ICM on home oxygen therapy, CAD, CABG/ICD, diabetes, hypertension, obstructive sleep apnea, hyperlipidemia. Pt with LLE pain, Doppler which revealed thrombus in the femoral, profunda, popliteal and greater saphenous veins. S/p DVT lysis of LLE with angioplasty and stenting of the L common/external iliac vein. on 9/25    PT Comments    Pt with increased ability with transfers, gait and HEP. Pt educated for transfers and mobility. Pt reports she performs limited ambulation at home and quite frequently not even 100'. Will continue to follow to progress mobility and independence.   Sat 97% on 2L HR 74  Follow Up Recommendations  Home health PT;Supervision - Intermittent     Equipment Recommendations       Recommendations for Other Services       Precautions / Restrictions Precautions Precautions: Fall Restrictions Weight Bearing Restrictions: No    Mobility  Bed Mobility Overal bed mobility: Needs Assistance Bed Mobility: Supine to Sit     Supine to sit: Min assist     General bed mobility comments: use of rails, assist to fully elevate trunk with HOB 20degrees  Transfers Overall transfer level: Needs assistance   Transfers: Sit to/from Stand Sit to Stand: Min guard         General transfer comment: cues for hand placement and safety  Ambulation/Gait Ambulation/Gait assistance: Min assist Ambulation Distance (Feet): 65 Feet Assistive device: Rolling walker (2 wheeled) Gait Pattern/deviations: Step-through pattern;Decreased stride length;Decreased stance time - left   Gait velocity interpretation: Below normal speed for age/gender General Gait Details: cues for posture, position in RW and self-regulation   Stairs            Wheelchair Mobility    Modified  [place] (Stroke Patients Only)       Balance                                    Cognition Arousal/Alertness: Awake/alert Behavior During Therapy: Flat affect Overall Cognitive Status: Within Functional Limits for tasks assessed                      Exercises General Exercises - Lower Extremity Long Arc Quad: AROM;Both;Seated;15 reps Hip ABduction/ADduction: AROM;Both;Seated;15 reps Hip Flexion/Marching: AROM;15 reps;Both;Seated Toe Raises: AROM;15 reps;Both;Seated    General Comments        Pertinent Vitals/Pain Pain Score: 5  Pain Location: left knee Pain Descriptors / Indicators: Aching Pain Intervention(s): Limited activity within patient's tolerance;Repositioned;Monitored during session    Home Living                      Prior Function            PT Goals (current goals can now be found in the care plan section) Progress towards PT goals: Progressing toward goals    Frequency           PT Plan Current plan remains appropriate    Co-evaluation             End of Session Equipment Utilized During Treatment: Gait belt;Oxygen Activity Tolerance: Patient tolerated treatment well Patient left: in chair;with call bell/phone within reach;with chair alarm set     Time: [id number] PT Time Calculation (min) (ACUTE ONLY): 25  min  Charges:  $Gait Training: 8-22 mins $Therapeutic Exercise: 8-22 mins                    G Codes:      Melford Aase Oct 17, 2015, 9:23 AM Elwyn Reach, [place]

## 2015-10-06 NOTE — Progress Notes (Signed)
ICU Transfer Note   Subjective:  [name] is a 65yo woman with PMHx of systolic CHF (EF [id number]), CAD s/p CABG x 5 vessels, ischemic cardiomyopathy s/p ICD placement, type 2 DM, and HTN who presented on 9/22 with a 3 month history of increasing left leg pain and swelling. She was started on a heparin gtt. Venous doppler showed acute DVT involving the common femoral, femoral, popliteal, and posterior tibial veins of the RIGHT lower extremity and acute DVT involving the common femoral, femoral, profunda, popliteal, posterior tibial, and peroneal veins of the LEFT lower extremity. Interventional radiology was consulted on 9/23 for consideration of thrombolysis. IR performed LLE venogram and initiated thrombolysis on 9/25. She was transferred to the ICU for further monitoring. 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   Today, she reports the pain and swelling in her left leg are improving. When we discussed that she would have to be on Lovenox injections for several weeks she seemed uncomfortable with this idea, but states she will ask her son if he would be able to help her. She denies any chest pain and does not feel short of breath on 2 L oxygen (her baseline). Reports some constipation but otherwise is feeling well.   Objective:  Vital signs in last 24 hours: Vitals:   10/06/15 0320 10/06/15 0400 10/06/15 0500 10/06/15 0600  BP:  (!) 114/44 (!) 113/92 (!) 123/50  Pulse:  (!) 55 60 (!) 52  Resp:  (!) 21 15 20   Temp: 98.2 F (36.8 C)     TempSrc: Oral     SpO2:  100% 99% 100%  Weight:   250 lb (113.4 kg)   Height:       General: alert, resting in bed, NAD HEENT: [place]/AT, EOMI, sclera anicteric, mucus membranes moist CV: RRR, no m/g/r Pulm: CTA bilaterally, diminished breath sounds at bases, breathing comfortably on 2 L oxygen via [place] Abd:  BS+, soft, obese, non-tender Ext: warm. Left lower extremity is much more swollen compared to right. No tenderness to palpation. Moves all extremities.  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 She tolerated the procedure well with no complications. IR is recommending 4-6 weeks of Lovenox before switching to DOAC. She is currently on heparin gtt still so will have her start Lovenox today.  - Lovenox per pharmacy for DVT tx - Stop heparin gtt - Bilateral thigh-high compression stockings for at least 6 months - f/u with Dr. Pascal Lux in 4 weeks with BLE venous dopplers  OSA: Sleep study in Jamie 2015. Does not use CPAP machine at home but is on 2 L oxygen. Likely will need repeat sleep study as outpatient so she can get CPAP titration study and CPAP machine at home.  - Continue oxygen as needed for now - Ok to keep sats in low 90s  Hx CAD s/p CABG: CABG was in 2007. No anginal symptoms currently. Appears she was on Atorvastatin previously but stopped when her lipid panel was normal. Has Atorvastatin listed as allergy but only had associated itching. Her ASCVD risk score is 12.5% indicating she needs high intensity statin therapy. - Continue home ASA, Coreg, Lisinopril - Restart Atorvastatin. If she does not tolerate then can  consider Crestor  Chronic Systolic CHF/Ischemic CMO s/p ICD: Last echo in March this year showed EF 40-45%, no regional wall motion abnormalities, grade 1 diastolic dysfunction. CXR yesterday showed some vascular congestion. Her weight is 5 lbs up from admission but overall trending down. UOP not being recorded accurately. She is at her baseline oxygen needs.  - Continue home Coreg, Lasix, Lisinopril, Spironolactone   Type 2 DM: Last A1c 7.8. Blood sugars have been in the 150-250 range. Her Lantus was  increased from 25 to 40 units yesterday and blood sugar much improved this morning to 134. Will continue current regimen as she likely needed increased basal insulin. - Continue Lantus 40 units QHS - Continue moderate ISS - Continue CBGs with meals and bedtime   HTN: BP stable in 0000000 systolic.  - Continue home Coreg, Lasix, Lisinopril, Spironolactone   Chronic LLE Pain: She reports she is on Oxycodone for her left leg pain. We discussed that she Jamie no longer need this medication since her pain has improved after the thrombolysis and stenting. She did not require any Oxycodone yesterday.  - Continue Oxycodone 5 mg Q6H PRN - Can discuss cutting back on this with patient further  Constipation: According to patient her last BM was 5-6 days ago. Likely related to her decreased activity. - Start Miralax - PT to work with her    Diet: Carb modified  DVT PPx: Heparin gtt, will be switching to Lovenox  Dispo: Anticipated discharge in approximately 1-2 day(s).   Juliet Rude, MD 10/06/2015, 7:05 AM Pager: [phone]

## 2015-10-06 NOTE — Progress Notes (Signed)
Medicine attending: Patient transfer back to Gen. medical service. I personally examined this patient today and reviewed pertinent clinical, laboratory, and x-ray data, and I concur with the detailed evaluation and management plan as recorded by resident physician Dr. Albin Felling. 65 year old woman admitted on September 22 with a three-month history of progressive left leg pain and swelling. She was found to have extensive bilateral proximal DVTs.  Risk factors for clotting include her weight, obesity sleep apnea syndrome, age, and female sex. 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 Vascular stent was placed on the 26th. Recommendation is full dose low molecular weight heparin for 4-6 weeks then transition to an oral anticoagulant. In review of the literature, there is no specific recommendation as to the total duration of anticoagulation but most experts are recommending 6 months. The patient has had significant relief of the pain in her leg that she was experiencing prior to the thrombolysis.  Current exam: Blood pressure (!) 114/52, pulse (!) 57, temperature 98.8 F (37.1 C), temperature source Oral, resp. rate 20, height 5\' 4"  (1.626 m), weight 250 lb (113.4 kg), SpO2 100 %. She is alert awake and oriented. Lungs are clear and resonant to percussion throughout. Regular cardiac rhythm with distant cardiac sounds. No obvious murmur or gallop. No JVD. Abdomen is soft and obese. Nontender. Extremities with some persistent asymmetric edema left greater than right. Calf and thigh no longer tender on palpation.  Hemoglobin stable at 11 g. Platelets 197,000 Renal function stable with BUN 16, creatinine 1.1.  Plan as outlined above.

## 2015-10-06 NOTE — Progress Notes (Signed)
Met with pt at bedside. She plans to return home with the support of her son who lives with her. She is currently active with [facility] [place]. [place] agency is aware that she is inpt. She has a RW. Will continue to f/u to assist with the d/c plan. Pt needs an order to resume HHPT and for a 3-in-1 BSC if she agrees.

## 2015-10-07 LAB — CBC
HEMATOCRIT: 34.3 % — AB (ref 36.0–46.0)
Hemoglobin: 10.8 g/dL — ABNORMAL LOW (ref 12.0–15.0)
MCH: 29.1 pg (ref 26.0–34.0)
MCHC: 31.5 g/dL (ref 30.0–36.0)
MCV: 92.5 fL (ref 78.0–100.0)
PLATELETS: 197 10*3/uL (ref 150–400)
RBC: 3.71 MIL/uL — ABNORMAL LOW (ref 3.87–5.11)
RDW: 15.7 % — AB (ref 11.5–15.5)
WBC: 5.6 10*3/uL (ref 4.0–10.5)

## 2015-10-07 LAB — BASIC METABOLIC PANEL
Anion gap: 7 (ref 5–15)
BUN: 17 mg/dL (ref 6–20)
CHLORIDE: 98 mmol/L — AB (ref 101–111)
CO2: 30 mmol/L (ref 22–32)
CREATININE: 1.2 mg/dL — AB (ref 0.44–1.00)
Calcium: 9.7 mg/dL (ref 8.9–10.3)
GFR calc Af Amer: 54 mL/min — ABNORMAL LOW (ref >60)
GFR calc non Af Amer: 46 mL/min — ABNORMAL LOW (ref >60)
Glucose, Bld: 178 mg/dL — ABNORMAL HIGH (ref 65–99)
POTASSIUM: 3.7 mmol/L (ref 3.5–5.1)
SODIUM: 135 mmol/L (ref 135–145)

## 2015-10-07 LAB — GLUCOSE, CAPILLARY
GLUCOSE-CAPILLARY: 203 mg/dL — AB (ref 65–99)
Glucose-Capillary: 148 mg/dL — ABNORMAL HIGH (ref 65–99)
Glucose-Capillary: 262 mg/dL — ABNORMAL HIGH (ref 65–99)

## 2015-10-07 MED ORDER — ENOXAPARIN SODIUM 120 MG/0.8ML ~~LOC~~ SOLN: 120.0000 mg | Freq: Two times a day (BID) | SUBCUTANEOUS | 1 refills | Status: DC

## 2015-10-07 MED ORDER — ROSUVASTATIN CALCIUM 20 MG PO TABS: 20.0000 mg | ORAL_TABLET | Freq: Every day | ORAL | 2 refills | Status: AC

## 2015-10-07 MED ORDER — ATORVASTATIN CALCIUM 40 MG PO TABS: 40.0000 mg | ORAL_TABLET | Freq: Every day | ORAL | 2 refills | Status: DC

## 2015-10-07 MED ORDER — ROSUVASTATIN CALCIUM 20 MG PO TABS
20.0000 mg | ORAL_TABLET | Freq: Every day | ORAL | Status: DC
  Administered 2015-10-07: 20 mg via ORAL
  Filled 2015-10-07: qty 1

## 2015-10-07 MED FILL — ENOXAPARIN 120 MG/0.8 ML SY: 120 | 22 days supply | Qty: 38 | Fill #0

## 2015-10-07 NOTE — Progress Notes (Signed)
Patient and Daughter of patient [name]) went over discharge plan and summary together with myself in full extent. I educated patient on signs/symptoms of when to contact PCP in regards to recent diagnoses. Also, educated on when patient should follow up with PCP and when next appointment will take place. Educated patient on new medications (Enoxaparin and crestor). Patient and daughter understood how to administer and take new administrations and side effects of especially with signs of bleeding with Enoxaparin.

## 2015-10-07 NOTE — Discharge Summary (Signed)
Name: [name] MRN: [id number] DOB: January 11, 1950 65 y.o. PCP: Everardo Beals, NP  Date of Admission: 09/30/2015  5:50 PM Date of Discharge: 10/07/2015 Attending Physician: Annia Belt, MD  Discharge Diagnosis: 1. 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)  Principal Problem:   DVT (deep venous thrombosis) (HCC) Active Problems:   Ischemic cardiomyopathy   CAD (coronary artery disease)   Hypertension   HLD (hyperlipidemia)   Insulin dependent diabetes mellitus (HCC)   S/P CABG x 5   Chronic combined systolic and diastolic CHF, NYHA class 3 (HCC)   Left leg pain   Discharge Medications:   Medication List    STOP taking these medications   Fenofibric Acid 135 MG Cpdr     TAKE these medications   albuterol 108 (90 Base) MCG/ACT inhaler Commonly known as:  PROVENTIL HFA;VENTOLIN HFA Inhale 2 puffs into the lungs every 6 (six) hours as needed for wheezing or shortness of breath.   albuterol (2.5 MG/3ML) 0.083% nebulizer solution Commonly known as:  PROVENTIL Take 2.5 mg by nebulization every 6 (six) hours as needed for wheezing or shortness of breath.   aspirin 81 MG EC tablet Take 1 tablet (81 mg total) by mouth daily.   carvedilol 12.5 MG tablet Commonly known as:  COREG Take 12.5 mg by mouth 2 (two) times daily with a meal.   enoxaparin 120 MG/0.8ML injection Commonly known as:  LOVENOX Inject 0.8 mLs (120 mg total) into the skin every 12 (twelve) hours.   fluconazole 150 MG tablet Commonly known as:  DIFLUCAN Take 150 mg by mouth daily.   furosemide 80 MG tablet Commonly known as:  LASIX Take 1 tablet (80 mg total) by mouth 2 (two) times daily.   glipiZIDE 5 MG tablet Commonly known as:  GLUCOTROL Take 5 mg by mouth daily before breakfast.   IRON PO Take 1 tablet by mouth daily.   isosorbide mononitrate 30 MG 24 hr tablet Commonly known as:  IMDUR Take 1 tablet (30 mg total) by mouth daily.   ketoconazole 2 % cream Commonly known as:   NIZORAL Apply 1 application topically 2 (two) times daily.   latanoprost 0.005 % ophthalmic solution Commonly known as:  XALATAN Place 1 drop into both eyes at bedtime.   lisinopril 10 MG tablet Commonly known as:  PRINIVIL,ZESTRIL Take 10 mg by mouth daily.   meloxicam 7.5 MG tablet Commonly known as:  MOBIC Take 7.5 mg by mouth daily.   methocarbamol 500 MG tablet Commonly known as:  ROBAXIN Take 1 tablet (500 mg total) by mouth every 8 (eight) hours as needed for muscle spasms.   nitroGLYCERIN 0.4 MG SL tablet Commonly known as:  NITROSTAT Place 1 tablet (0.4 mg total) under the tongue every 5 (five) minutes as needed. What changed:  reasons to take this   NOVOLOG FLEXPEN 100 UNIT/ML FlexPen Generic drug:  insulin aspart Inject 30-50 Units into the skin See admin instructions. 50 units every morning, 30 units at lunch and 50 units at suppre   potassium chloride SA 20 MEQ tablet Commonly known as:  K-DUR,KLOR-CON Take 20 mEq by mouth.   potassium phosphate (monobasic) 500 MG tablet Commonly known as:  K-PHOS ORIGINAL Take 500 mg by mouth daily as needed (takes when needed for spasms).   rosuvastatin 20 MG tablet Commonly known as:  CRESTOR Take 1 tablet (20 mg total) by mouth daily at 6 PM.   spironolactone 25 MG tablet Commonly known as:  ALDACTONE Take 0.5 tablets (  12.5 mg total) by mouth daily.       Disposition and follow-up:   [name] was discharged from [facility] in Good condition.  At the hospital follow up visit please address:  1.  DM2.  Please adjust insulin regimen.  She will require a basal insulin in addition to her short acting.  2.  Anticoagulation.  Prescribed lovenox for 6 weeks.  Will need DOAC afterwards for further anticoagulation for 6+ months.  3.  CAD.  Starting high-intensity statin therapy with rosuvastatin and discontinued fenofibrate.  Assess tolerability of statin.  2.  Labs / imaging needed at time of  follow-up: none  3.  Pending labs/ test needing follow-up: none  Follow-up Appointments: Follow-up Information    Sandi Mariscal, MD. Schedule an appointment as soon as possible for a visit in 4 week(s).   Specialty:  Interventional Radiology Why:  Please call ASAP to make appointment Contact information: [address] [place] [address] [phone]        Imelda Pillow, NP. Go in 7 day(s).   Why:  at 11am Contact information: [facility] [place] [id number] [phone]           Hospital Course by problem list: Principal Problem:   DVT (deep venous thrombosis) ([place]) Active Problems:   Ischemic cardiomyopathy   CAD (coronary artery disease)   Hypertension   HLD (hyperlipidemia)   Insulin dependent diabetes mellitus (HCC)   S/P CABG x 5   Chronic combined systolic and diastolic CHF, NYHA class 3 (HCC)   Left leg pain   1. DVT She presented with a 3 month history of increasing left leg swelling and pain which recently had prevented her from walking.  She presented after her PCP ordered a vascular ultrasound which showed extensive DVT of the left leg.  No dyspnea, chest pain, or hypoxia to suggest PE.  She was started on therapeutic heparin and Interventional Radiology was consulted due to her severe symptoms and large clot burden.  Since her prior ultrasound was not available for review, repeat bilateral vascular ultrasound was performed to assist in interventional planning.  This study revealed extensive clot in her left leg involving common femoral, femoral, profunda, popliteal, posterior tibial, and peroneal veins on the left and common femoral, femoral, popliteal, and posterior tibial veins on the right.  IR performed venography which confirmed extensive lower extremitis thrombus and revealed left iliac vein compression consistent with [name].  The IVC was patent.  Thrombolysis was then performed  with tPA through indwelling venous catheters 9/25-9/26 while she was monitored in the ICU, and a stent placed in the left common/external iliac vein at the conclusion of lysis.  There were no procedural complications or bleeding.  She was then transferred back the care of Internal Medicine Teaching Service on 9/28 and began anticoagulation with lovenox.  She was fit with compression stockings which she will wear for the next 6 months.  She will be anticoagulated with lovenox for the next 6 weeks, and was taught to give injections.  She will follow-up with PCP next week and IR in 4 weeks, and will need to start a DOAC to complete a minimum of 6 months anticoagulation at the conclusion of her 6 weeks of lovenox.  2. CAD Discharged on high-intensity rosuvastatin since she reports previous itching with atorvastatin.  Discontinued fenofibrate.  3. CHF Continued home carvedilol, furosemide, lisinopril, and spironolactone.  4. DM2 Held home glipizide.  Inpatient insulin regimen consisted of 40 U Lantus daily and sliding scale insulin at mealtimes and bedtime (receiving on average 5U), for total daily insulin requirment of ~60U with CBGs 150-200s.   Discharge Vitals:   BP (!) 102/57 (BP Location: Right Arm)   Pulse (!) 54   Temp 98.6 F (37 C) (Oral)   Resp 19   Ht 5\' 4"  (1.626 m)   Wt 249 lb 9 oz (113.2 kg)   SpO2 99%   BMI 42.84 kg/m   Pertinent Labs, Studies, and Procedures:   CBC Latest Ref Rng & Units 10/07/2015 10/06/2015 10/05/2015  WBC 4.0 - 10.5 K/uL 5.6 7.4 7.8  Hemoglobin 12.0 - 15.0 g/dL 10.8(L) 11.2(L) 11.9(L)  Hematocrit 36.0 - 46.0 % 34.3(L) 34.9(L) 36.9  Platelets 150 - 400 K/uL 197 197 180   BMP Latest Ref Rng & Units 10/07/2015 10/06/2015 10/04/2015  Glucose 65 - 99 mg/dL 178(H) 159(H) 157(H)  BUN 6 - 20 mg/dL 17 16 12   Creatinine 0.44 - 1.00 mg/dL 1.20(H) 1.08(H) 0.91  Sodium 135 - 145 mmol/L 135 135 137  Potassium 3.5 - 5.1 mmol/L 3.7 3.9 4.0  Chloride 101 - 111 mmol/L  98(L) 98(L) 99(L)  CO2 22 - 32 mmol/L 30 31 30   Calcium 8.9 - 10.3 mg/dL 9.7 9.5 9.4   Vascular Ultrasound Bilateral Lower Extremities (10/02/15): - Findings consistent with acute deep vein thrombosis involving the   common femoral, femoral, popliteal, and posterior tibial veins of   the right lower extremity. - Findings consistent with acute deep vein thrombosis involving the   common femoral, femoral, profunda, popliteal, posterior tibial,   and peroneal veins and at the saphenofemoral junction of the left   lower extremity.  IR Venography (10/03/2015): FINDINGS: 1. 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 Acute thrombus within the femoral vein in the proximal thigh and the common femoral vein 4. 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  Lysis check tomorrow. Patient will likely require stent placement for iliac vein compression.  IR Thrombolysis Final Day (10/04/2015): IMPRESSION: 1. 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 2. 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  PLAN: - Bed rest for 2 hours with left lower extremity maintain flat. Then may ambulate as tolerated.  - Would continue IV heparin until patient is therapeutically anticoagulated with Lovenox.  - Would recommend Lovenox anti coagulation for at least the next 4-6 weeks prior to converting to an oral  anticoagulant.  - Would recommend bilateral thigh-high compression stockings can at least 6 months of anti coagulation.  - The patient will be given an appointment to be evaluated at the interventional radiology clinic [phone]) in approximately 4 weeks at which time a bilateral lower extremity venous Doppler ultrasound will be obtained.  Discharge Instructions: Discharge Instructions    Diet - low sodium heart healthy    Complete by:  As directed    Increase activity slowly  Complete by:  As directed       You were admitted to the hospital for a blood clot in your leg (deep vein thrombosis), which was causing your leg to be swollen and painful.  You were treated with blood thinners and also a treatment through the veins to dissolve the clot.  You will need to continue on blood thinners for at least the next 6 months to prevent another clot from forming in your legs.  You will need to given yourself injections of Lovenox/Enoxaparin, which is a blood thinner, twice daily for the next 6 weeks (until 11/18/2015).  Other changes have been made to your medicines, so please review your discharge medication list and take them as prescribed.  Please wear your compression stockings all the time except when bathing for next 6 months.  Please follow-up with your PCP and Dr Pascal Lux, the Interventional Radiologist who dissolved the clot.  If you have chest pain, shortness of breath, or bleeding that will not stop, come the the ED.  Signed: Minus Liberty, MD 10/07/2015, 1:53 PM   Pager: [phone]

## 2015-10-07 NOTE — Care Management Note (Addendum)
Case Management Note Original Note written by Magdalen Spatz 10/03/15  Patient Details  Name: [name] MRN: [id number] Date of Birth: 1950/06/28  Subjective/Objective:                    Action/Plan:  Patient is active with [facility] and wants to remain with same agency . [facility] with [facility].  Patient has home oxygen through [place] . Asked patient if she has a portable tank for day of discharge , patient stated yes but " I don't need it , I stay close to here " . Offered NCM can arrange a portable tank to be delivered to hospital room for transportation home. Patient declined stating she won't need it.  Instructed patient if she changed her mind to ask nurse. Will check back with patient.    Patient states she is active with a home health agency but is not sure of name. Her PCP Everardo Beals arranged same. Called Dr Benna Dunks office and left message , awaiting call back.   PT recommending 3 in 1 , patient aware but does not want 3 in 1 at this time. Expected Discharge Date:                  Expected Discharge Plan:  [place]  In-House Referral:     Discharge planning Services  CM Consult  Post Acute Care Choice:  Home Health, Durable Medical Equipment Choice offered to:  Patient  DME Arranged:  3-N-1 DME Agency:  [place] Arranged:  PT, OT, Nurse's Aide, RN [facility] Agency:     Status of Service:  In process, will continue to follow  If discussed at Long Length of Stay Meetings, dates discussed:    Additional Comments: 10/07/2015 [name], RN, BSN [phone] Benefit Check results;  LOVENOX 120 MG SYRINGES BID  * NOT COVER *   ENOXAPARIN 120 MG SYRINGES BID  COVER- YES  CO-PAY- ZERO DOLLARS  TIER- 4 DRUG  PRIOR APPROVAL - NO  PHARMACY : [facility]   MAIL-ORDER FOR 76 DAY SUPPLY ZERO DOLLARS   CM went with pts daughter to outpt pharmacy and medication was filled.  Per  pharmacy prior approval required - pharmacy working on prior approval. Financial controller paged attending and provided prior auth information, CM also paged attending directly.  CM also provided prior auth info to daughter [name] and informed her that she needs to provide info to primary doctor if unable to get another fill.  Pharmacy will order the remainder of the monthly requirement and inform pt when medication is available for pick up  Benefit check information shared directly with pt.  [facility] [address] does not have inventory for fill.  Cm contacted outpt [facility] and was told that the dose is too high for filling with current insurance parameters - pharmacy requested that prescription be faxed directly to them so they can contact insurance company and request waver.  [facility] outpt pharmacy has agreed to provide the pt enough syringes to cover the weekend and order the rest to be available to pt on Monday at no cost to the pt  Pt states that her son will be with her post discharge.  Pt wears home oxygen supplied by [facility] (2 liters) - pt is now on 2L.  Pt does not have portable tank and refused for CM to provide tank for transport home, pt stated "  I don't stay far from here, I don't normally take it with me when I go out cause I wont be out that long".  CM contacted [facility] and verified referral has been given for the above [facility] services.  Pt will accept 3:1, pt will purchase tub bench separately.  CM offered choice for DME - pt chose [facility] - agency contacted and referral accepted.  CM submitted benefit check for Lovenox.  Daughter will pick pt up from home - bedside nurse planning on teaching both pt and daughter about administration for Lovenox  Maryclare Labrador, RN 10/07/2015, 8:55 AM

## 2015-10-07 NOTE — Care Management Important Message (Signed)
Important Message  Patient Details  Name: [name] MRN: [id number] Date of Birth: 08/10/1950   Medicare Important Message Given:  Yes    Keaunna Skipper 10/07/2015, 11:03 AM

## 2015-10-07 NOTE — Progress Notes (Signed)
Enoxaparin 120mg /0.80ml prescription filled by outpatient pharmacy. This was witnessed with [employer] Education officer, museum. Verified that dose was correct upon discharge.

## 2015-10-07 NOTE — Progress Notes (Signed)
Subjective:  [name] is feeling well, with improving left leg swelling and minimal pain.  She walked short distances with PT yesterday without trouble.  Objective:  Vital signs in last 24 hours: Vitals:   10/07/15 0500 10/07/15 0715 10/07/15 0800 10/07/15 1127  BP:   (!) 119/54   Pulse:   (!) 52   Resp:   14   Temp:  98.7 F (37.1 C)  98.6 F (37 C)  TempSrc:  Oral  Oral  SpO2:   100%   Weight: 249 lb 9 oz (113.2 kg)     Height:       Physical Exam  Constitutional: She is oriented to person, place, and time. She appears well-developed and well-nourished. No distress.  Cardiovascular:  Distant heart sounds, normal S1S2  Pulmonary/Chest: Effort normal and breath sounds normal.  Musculoskeletal:  Bilateral thigh high compression stockings in place L > R LE edema, soft, non tender  Neurological: She is alert and oriented to person, place, and time.  Skin: Skin is warm and dry.  Psychiatric: Her behavior is normal.   CBC Latest Ref Rng & Units 10/07/2015 10/06/2015 10/05/2015  WBC 4.0 - 10.5 K/uL 5.6 7.4 7.8  Hemoglobin 12.0 - 15.0 g/dL 10.8(L) 11.2(L) 11.9(L)  Hematocrit 36.0 - 46.0 % 34.3(L) 34.9(L) 36.9  Platelets 150 - 400 K/uL 197 197 180   BMP Latest Ref Rng & Units 10/07/2015 10/06/2015 10/04/2015  Glucose 65 - 99 mg/dL 178(H) 159(H) 157(H)  BUN 6 - 20 mg/dL 17 16 12   Creatinine 0.44 - 1.00 mg/dL 1.20(H) 1.08(H) 0.91  Sodium 135 - 145 mmol/L 135 135 137  Potassium 3.5 - 5.1 mmol/L 3.7 3.9 4.0  Chloride 101 - 111 mmol/L 98(L) 98(L) 99(L)  CO2 22 - 32 mmol/L 30 31 30   Calcium 8.9 - 10.3 mg/dL 9.7 9.5 9.4     Assessment/Plan:  Extensive Bilateral LE DVT ([name] on left): s/p thrombolysis of LLE with angioplasty and stenting of the left common/external iliac vein.  - Lovenox per pharmacy for DVT tx - Stop heparin gtt - Bilateral thigh-high compression stockings for at least 6 months - f/u with Dr. Pascal Tran in 4 weeks with BLE venous dopplers  OSA:  Sleep study in May 2015. Does not use CPAP machine at home but is on 2 L oxygen. Likely will need repeat sleep study as outpatient so she can get CPAP titration study and CPAP machine at home.  - Continue oxygen as needed for now - Ok to keep sats in low 90s  Hx CAD s/p CABG: CABG was in 2007. No anginal symptoms currently. Appears she was on Atorvastatin previously but stopped when her lipid panel was normal. Has Atorvastatin listed as allergy but only had associated itching. Her ASCVD risk score is 12.5% indicating she needs high intensity statin therapy. - Continue home ASA, Coreg, Lisinopril - Atorvastatin 40 mg  Chronic Systolic CHF/Ischemic CMO s/p ICD: Last echo in March this year showed EF 40-45%, no regional wall motion abnormalities, grade 1 diastolic dysfunction. CXR yesterday showed some vascular congestion. Her weight is 5 lbs up from admission but overall trending down. UOP not being recorded accurately. She is at her baseline oxygen needs.  - Continue home Coreg, Lasix, Lisinopril, Spironolactone   Type 2 DM: Last A1c 7.8. Blood sugars have been in the 150-250 range. Her Lantus was increased from 25 to 40 units yesterday and blood sugar much improved this morning to 134. Will continue current regimen as  she likely needed increased basal insulin. - Continue Lantus 40 units QHS - Continue moderate ISS - Continue CBGs with meals and bedtime   HTN: BP stable in 0000000 systolic.  - Continue home Coreg, Lasix, Lisinopril, Spironolactone   Dispo: Anticipated discharge in approximately 1 day(s).  Home with home health.  Minus Liberty, MD 10/07/2015, 11:32 AM Pager: [phone]

## 2015-10-10 ENCOUNTER — Other Ambulatory Visit (HOSPITAL_COMMUNITY): Payer: Self-pay | Admitting: General Surgery

## 2015-10-10 ENCOUNTER — Other Ambulatory Visit (HOSPITAL_COMMUNITY): Payer: Self-pay | Admitting: Interventional Radiology

## 2015-10-10 DIAGNOSIS — I82402 Acute embolism and thrombosis of unspecified deep veins of left lower extremity: Secondary | ICD-10-CM

## 2015-10-11 NOTE — Care Management (Signed)
10/11/2015 CM contacted pharmacy to ensure that prior auth had been completed - per pharmacy this is still incomplete.  Pharmacy has agreed to provide pt today with the remainder of the months fill of enoxaparin.  Pharmacy will continue to work with IM towards completion of prior auth.  CM called pt and daughter [name], left voicemails informing that the remainder of the months dose is now ready at pharmacy - encouraged pt and daughter to follow up with PCP ASAP to ensure prior Josem Kaufmann is complete before this months inventory of medication is depleted.  10/10/15 CM contacted pharmacy to ensure that the remainder of pts prescription can be filled.  CM informed that prior auth hasn't yet been obtained.  Pharmacy contacted IM clinic and was told that clinic would complete the prior auth.

## 2015-10-17 ENCOUNTER — Telehealth: Payer: Self-pay | Admitting: Pharmacist

## 2015-10-17 NOTE — Progress Notes (Signed)
Contacted patient for help with access to enoxaparin (6-week duration of therapy), currently on week 2 of therapy. Patient states she has 4-week supply of enoxaparin. Advised patient to contact me if further access help needed. Patient verbalized understanding.

## 2015-10-18 ENCOUNTER — Ambulatory Visit (INDEPENDENT_AMBULATORY_CARE_PROVIDER_SITE_OTHER): Payer: Medicare HMO

## 2015-10-18 ENCOUNTER — Telehealth: Payer: Self-pay | Admitting: Cardiology

## 2015-10-18 DIAGNOSIS — Z95810 Presence of automatic (implantable) cardiac defibrillator: Secondary | ICD-10-CM | POA: Diagnosis not present

## 2015-10-18 DIAGNOSIS — I5042 Chronic combined systolic (congestive) and diastolic (congestive) heart failure: Secondary | ICD-10-CM

## 2015-10-18 DIAGNOSIS — I5022 Chronic systolic (congestive) heart failure: Secondary | ICD-10-CM

## 2015-10-18 NOTE — Telephone Encounter (Signed)
Spoke with pt and reminded pt of remote transmission that is due today. Pt verbalized understanding.   

## 2015-10-19 ENCOUNTER — Ambulatory Visit: Payer: Medicare HMO | Admitting: Diagnostic Neuroimaging

## 2015-10-19 NOTE — Progress Notes (Signed)
EPIC Encounter for ICM Monitoring  Patient Name: [name] is a 65 y.o. female Date: 10/19/2015 Primary Care Physican: Imelda Pillow, NP Primary Cardiologist: Marlou Porch Electrophysiologist: Allred Dry Weight:  unknown       Heart Failure questions reviewed, pt asymptomatic.  Patient was hospitalized 09/30/2015 to 10/07/2015 for DVT.   Thoracic impedance abnormal suggesting fluid accumulation.  Labs: 10/07/2015 Creatinine 1.20, BUN 17, Potassium 3.7, Sodium 135 10/06/2015 Creatinine 1.08, BUN 16, Potassium 3.9, Sodium 135 10/04/2015 Creatinine 0.91, BUN 12, Potassium 4.0, Sodium 137 10/03/2015 Creatinine 1.16, BUN 15, Potassium 4.4, Sodium 133 10/02/2015 Creatinine 1.27, BUN 15, Potassium 3.9, Sodium 137 10/01/2015 Creatinine 1.07, BUN 13, Potassium 3.5, Sodium 137 09/30/2015 Creatinine 0.98, BUN 13, Potassium 4.0, Sodium 138 09/20/2015 Creatinine 0.88, BUN 13, Potassium 3.9, Sodium 136 05/19/2015 Creatinine 1.11, BUN 16, Potassium 4.1, Sodium 138   Recommendations: Increase Furosemide 80 mg to 1 1/2 tablets am and 1 tablet pm x 2 days and then return to prescribed dosage of 1 tablet bid.  Advised to limit salt intake to 2000 mg daily.     Patient has not been taking Potassium 20 mEq as prescribed.  Advised to take Potassium 20 mEq 1 tablet as prescribed.    Follow-up plan: ICM clinic phone appointment on 10/24/2015.  Copy of ICM check sent to primary cardiologist and device physician.   ICM trend: 10/18/2015       [name], RN 10/19/2015 12:25 PM

## 2015-10-24 ENCOUNTER — Telehealth: Payer: Self-pay

## 2015-10-24 ENCOUNTER — Ambulatory Visit (INDEPENDENT_AMBULATORY_CARE_PROVIDER_SITE_OTHER): Payer: Medicare HMO

## 2015-10-24 DIAGNOSIS — I5022 Chronic systolic (congestive) heart failure: Secondary | ICD-10-CM

## 2015-10-24 DIAGNOSIS — Z95810 Presence of automatic (implantable) cardiac defibrillator: Secondary | ICD-10-CM

## 2015-10-24 NOTE — Telephone Encounter (Signed)
ICM call to patient and advised to send scheduled ICM remote transmission today and she agreed.

## 2015-10-24 NOTE — Progress Notes (Signed)
EPIC Encounter for ICM Monitoring  Patient Name: [name] is a 65 y.o. female Date: 10/24/2015 Primary Care Physican: Imelda Pillow, NP Primary Cardiologist:[place] Electrophysiologist: Allred Dry Weight:     unknown                                                   Heart Failure questions reviewed, pt asymptomatic.  Patient was hospitalized 09/30/2015 to 10/07/2015 for DVT.   Thoracic impedance returned to normal after taking increase in Furosemide x 2 days.   Labs: 10/07/2015 Creatinine 1.20, BUN 17, Potassium 3.7, Sodium 135 10/06/2015 Creatinine 1.08, BUN 16, Potassium 3.9, Sodium 135 10/04/2015 Creatinine 0.91, BUN 12, Potassium 4.0, Sodium 137 10/03/2015 Creatinine 1.16, BUN 15, Potassium 4.4, Sodium 133 10/02/2015 Creatinine 1.27, BUN 15, Potassium 3.9, Sodium 137 10/01/2015 Creatinine 1.07, BUN 13, Potassium 3.5, Sodium 137 09/30/2015 Creatinine 0.98, BUN 13, Potassium 4.0, Sodium 138 09/20/2015 Creatinine 0.88, BUN 13, Potassium 3.9, Sodium 136 05/19/2015 Creatinine 1.11, BUN 16, Potassium 4.1, Sodium 138  Recommendations: No changes.  Advised to limit salt intake to 2000 mg daily.  Encouraged to call for fluid symptoms.    Follow-up plan: ICM clinic phone appointment on 11/24/2015.  Copy of ICM check sent to primary cardiologist and device physician.   ICM trend: 10/24/2015       [name], RN 10/24/2015 12:44 PM

## 2015-11-02 ENCOUNTER — Ambulatory Visit
Admission: RE | Admit: 2015-11-02 | Discharge: 2015-11-02 | Disposition: A | Discharge status: Home / Self Care | Payer: Medicare HMO | Source: Ambulatory Visit | Attending: Interventional Radiology | Admitting: Interventional Radiology

## 2015-11-02 ENCOUNTER — Ambulatory Visit
Admission: RE | Admit: 2015-11-02 | Discharge: 2015-11-02 | Disposition: A | Discharge status: Home / Self Care | Payer: Medicare HMO | Source: Ambulatory Visit | Attending: General Surgery | Admitting: General Surgery

## 2015-11-02 DIAGNOSIS — I82402 Acute embolism and thrombosis of unspecified deep veins of left lower extremity: Secondary | ICD-10-CM

## 2015-11-02 HISTORY — PX: IR GENERIC HISTORICAL: IMG1180011

## 2015-11-02 NOTE — Progress Notes (Signed)
Patient ID: [name], female   DOB: [birth year], 65 y.o.   MRN: [id number]        Chief Complaint: Left lower extremity DVT, post catheter directed thrombolysis  Referring Physician(s): Granfrotuna (heme-onc)   History of Present Illness: [name] is a 65 y.o. female with past medical history significant for CAD (post CABG), CHF, obesity, oxygen dependent (uses O2 at night) diabetes and hypertension who underwent tentatively successful left lower extremity catheter directed thrombolysis and venous stent placement with procedure completed on 10/04/2015. The patient returns today for post procedural evaluation and management. She is accompanied by her daughter though serves as her own historian.  In brief, prior to admission in late September, the patient reported a 3 month history of left lower extremity pain and edema.  Venous doppler ultrasound confirmed the presence of occlusive extensive left lower extremity DVT. Patient was initially treated conservatively, however ultimately pursued catheter directed thrombolysis.  Patient states that her left lower extremity pain and edema is improved following the procedure though she continues to experience persistent asymmetric left lower extremity swelling and left knee pain.   She is otherwise without complaint. No chest pain or shortness of breath.   The patient is tolerating her Lovenox injections without incident. No bloody or melanotic stools.  Past Medical History:  Diagnosis Date  . AICD (automatic cardioverter/defibrillator) present   . Angina decubitus ([place]) 11/06/2012  . Angina pectoris ([place])   . CHF (congestive heart failure) ([place])   . Chronic combined systolic and diastolic CHF, NYHA class 3 ([place])   . Coronary artery disease    s/p CABG 2007  . Diabetes mellitus    type II  . DVT (deep venous thrombosis) ([place]) 09/2015   bilateral  . Hypertension   . Ischemic cardiomyopathy    EF 30-35%, s/p ICD 4/08 by Dr Leonia Reeves  .  Morbid obesity ([place]) 11/06/2012  . Obesity   . Oxygen dependent 2 L  . 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 Sleep apnea    uses O2 at night  . Ventricular tachycardia ([place]) 01/16/15   sustained VT terminated with ATP, CL 340 msec    Past Surgical History:  Procedure Laterality Date  . BI-VENTRICULAR IMPLANTABLE CARDIOVERTER DEFIBRILLATOR UPGRADE N/A 09/25/2012   Procedure: BI-VENTRICULAR IMPLANTABLE CARDIOVERTER DEFIBRILLATOR UPGRADE;  Surgeon: Coralyn Mark, MD;  Location[facility] CATH LAB;  Service: Cardiovascular;  Laterality: N/A;  . CARDIAC CATHETERIZATION    . CARDIAC DEFIBRILLATOR PLACEMENT  4/08   by Dr Leonia Reeves ([facility]T)  . CORONARY ARTERY BYPASS GRAFT  10/05/2005   by Dr Cyndia Bent  . IMPLANTABLE CARDIOVERTER DEFIBRILLATOR IMPLANT  09/25/12   attempt of upgrade to CRT-D unsuccessful due to CS anatomy, SJM Unify Asaura device placed with LV port capped by Dr Rayann Heman  . IR GENERIC HISTORICAL  10/03/2015   IR US GUIDE VASC ACCESS LEFT 10/03/2015 Jacqulynn Cadet, MD [facility]  . IR GENERIC HISTORICAL  10/03/2015   IR VENO/EXT/UNI LEFT 10/03/2015 Jacqulynn Cadet, MD [facility]  . IR GENERIC HISTORICAL  10/03/2015   IR VENOCAVAGRAM IVC 10/03/2015 Jacqulynn Cadet, MD [facility]  . IR GENERIC HISTORICAL  10/03/2015   IR INFUSION THROMBOL VENOUS INITIAL (MS) 10/03/2015 Jacqulynn Cadet, MD [facility]  . IR GENERIC HISTORICAL  10/03/2015   IR US GUIDE VASC ACCESS LEFT 10/03/2015 Jacqulynn Cadet, MD [facility]  .  IR GENERIC HISTORICAL  10/04/2015   IR THROMBECT VENO MECH MOD SED 10/04/2015 Sandi Mariscal, MD [facility]  . IR GENERIC HISTORICAL  10/04/2015   IR TRANSCATH PLC STENT 1ST ART NOT LE CV CAR VERT CAR 10/04/2015 Sandi Mariscal, MD [facility]  . IR GENERIC HISTORICAL  10/04/2015   IR THROMB F/U EVAL ART/VEN FINAL DAY (MS) 10/04/2015 Sandi Mariscal, MD [facility]    Allergies: Metformin and  related; Atorvastatin; and Levofloxacin  Medications: Prior to Admission medications   Medication Sig Start Date End Date Taking? Authorizing Provider  albuterol (PROVENTIL HFA;VENTOLIN HFA) 108 (90 BASE) MCG/ACT inhaler Inhale 2 puffs into the lungs every 6 (six) hours as needed for wheezing or shortness of breath.   Yes Historical Provider, MD  albuterol (PROVENTIL) (2.5 MG/3ML) 0.083% nebulizer solution Take 2.5 mg by nebulization every 6 (six) hours as needed for wheezing or shortness of breath.  09/30/15  Yes Historical Provider, MD  carvedilol (COREG) 12.5 MG tablet Take 12.5 mg by mouth 2 (two) times daily with a meal.  03/30/15  Yes Historical Provider, MD  enoxaparin (LOVENOX) 120 MG/0.8ML injection Inject 0.8 mLs (120 mg total) into the skin every 12 (twelve) hours. 10/07/15  Yes Minus Liberty, MD  fluconazole (DIFLUCAN) 150 MG tablet Take 150 mg by mouth daily.  05/15/15  Yes Historical Provider, MD  furosemide (LASIX) 80 MG tablet Take 1 tablet (80 mg total) by mouth 2 (two) times daily. 08/19/15  Yes Jerline Pain, MD  glipiZIDE (GLUCOTROL) 5 MG tablet Take 5 mg by mouth daily before breakfast.    Yes Historical Provider, MD  IRON PO Take 1 tablet by mouth daily.   Yes Historical Provider, MD  isosorbide mononitrate (IMDUR) 30 MG 24 hr tablet Take 1 tablet (30 mg total) by mouth daily. 08/19/15 11/17/15 Yes Jerline Pain, MD  ketoconazole (NIZORAL) 2 % cream Apply 1 application topically 2 (two) times daily.  03/30/15  Yes Historical Provider, MD  latanoprost (XALATAN) 0.005 % ophthalmic solution Place 1 drop into both eyes at bedtime.   Yes Historical Provider, MD  lisinopril (PRINIVIL,ZESTRIL) 10 MG tablet Take 10 mg by mouth daily.    Yes Historical Provider, MD  meloxicam (MOBIC) 7.5 MG tablet Take 7.5 mg by mouth daily. 02/10/15  Yes Historical Provider, MD  methocarbamol (ROBAXIN) 500 MG tablet Take 1 tablet (500 mg total) by mouth every 8 (eight) hours as needed for muscle spasms.  05/30/14  Yes Barton Dubois, MD  nitroGLYCERIN (NITROSTAT) 0.4 MG SL tablet Place 1 tablet (0.4 mg total) under the tongue every 5 (five) minutes as needed. Patient taking differently: Place 0.4 mg under the tongue every 5 (five) minutes as needed for chest pain.  05/19/15  Yes Scott T Weaver, PA-C  NOVOLOG FLEXPEN 100 UNIT/ML FlexPen Inject 30-50 Units into the skin See admin instructions. 50 units every morning, 30 units at lunch and 50 units at suppre 09/12/15  Yes Historical Provider, MD  potassium chloride SA (K-DUR,KLOR-CON) 20 MEQ tablet Take 20 mEq by mouth. 09/12/15  Yes Historical Provider, MD  rosuvastatin (CRESTOR) 20 MG tablet Take 1 tablet (20 mg total) by mouth daily at 6 PM. 10/07/15  Yes Minus Liberty, MD  spironolactone (ALDACTONE) 25 MG tablet Take 0.5 tablets (12.5 mg total) by mouth daily. 03/28/15  Yes Liliane Shi, PA-C  aspirin EC 81 MG EC tablet Take 1 tablet (81 mg total) by mouth daily. Patient not taking: Reported on 11/02/2015 09/11/12   Elta Guadeloupe  Lurline Del, MD  potassium phosphate, monobasic, (K-PHOS ORIGINAL) 500 MG tablet Take 500 mg by mouth daily as needed (takes when needed for spasms).     Historical Provider, MD     Family History  Problem Relation Age of Onset  . Diabetes Mother 61    died - HTN  . Other      No early family hx of CAD    Social History   Social History  . Marital status: Married    Spouse name: N/A  . Number of children: 3  . Years of education: N/A   Occupational History  . HAIRDRESSER Unemployed   Social History Main Topics  . Smoking status: Never Smoker  . Smokeless tobacco: Never Used  . Alcohol use No  . Drug use: No  . Sexual activity: Not Currently   Other Topics Concern  . Not on file   Social History Narrative   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    ECOG Status: 2 - Symptomatic, <50% confined to bed  Review of Systems: A 12 point ROS discussed and pertinent positives are indicated  in the HPI above.  All other systems are negative.  Review of Systems  Constitutional: Negative for activity change and appetite change.  Respiratory: Negative for shortness of breath.   Cardiovascular: Negative for chest pain.  Gastrointestinal: Negative for blood in stool.  Musculoskeletal: Negative for back pain.       Patient admits to asymmetric left lower extremity swelling and left knee pain.    Vital Signs: BP (!) 131/55 (BP Location: Right Arm, Patient Position: Sitting, Cuff Size: Large)   Pulse (!) 56   Temp 97.9 F (36.6 C) (Oral)   Resp 15   Ht 5\' 4"  (1.626 m)   Wt 245 lb (111.1 kg)   SpO2 98%   BMI 42.05 kg/m   Physical Exam  Constitutional: She appears well-developed and well-nourished.  HENT:  Head: Atraumatic.  Musculoskeletal:       Left knee: Tenderness found.  Asymmetric left lower extremity edema. Left lower extremity is estimated to be at least 1/3 greater than the contralateral right lower extremity.  Nursing note and vitals reviewed.   Imaging:  Completion images from left lower extremity thrombolysis, angioplasty and stent placement performed 10/04/2015 were reviewed in detail with the patient.  Dg Chest 2 View  Result Date: 10/05/2015 CLINICAL DATA:  Hypoxia with respiratory failure EXAM: CHEST  2 VIEW COMPARISON:  May 06, 2015 FINDINGS: There is no edema or consolidation. There is cardiomegaly with pulmonary venous hypertension. Pacemaker leads are attached to the right atrium and right ventricle. There is atherosclerotic calcification in the aorta. Patient is status post coronary artery bypass grafting. No adenopathy. No bone lesions. IMPRESSION: Pulmonary vascular congestion. No frank edema or consolidation. Pacemaker lead tips attached to right atrium and right ventricle. There is aortic atherosclerosis. Electronically Signed   By: Lowella Grip III M.D.   On: 10/05/2015 12:39   Ir Veno/ext/uni Left  Result Date: 10/03/2015 INDICATION:  65 year old female with a 3 month history of left lower extremity pain and swelling. She was recently diagnosed with extensive bilateral lower extremity DVT. However, only the left lower extremity is symptomatic. She has not experienced relief despite several days of heparinization. She presents for catheter directed thrombolysis. EXAM: IR INFUSION THROMBOL VENOUS INITIAL (MS); IR ULTRASOUND GUIDANCE VASC ACCESS LEFT; LEFT EXTREMITY VENOGRAPHY; INFERIOR VENA CAVOGRAM 1. Ultrasound-guided access anterior tibial vein 2. Left lower  extremity venogram 3. Ultrasound-guided access popliteal vein 4. Inferior vena cavagram 5. Initiation of venous thrombolysis COMPARISON:  None. MEDICATIONS: None. ANESTHESIA/SEDATION: Versed 1.5 mg IV; Fentanyl 75 mcg IV Moderate Sedation Time:  42 minutes The patient was continuously monitored during the procedure by the interventional radiology nurse under my direct supervision. FLUOROSCOPY TIME:  Fluoroscopy Time: 8 minutes 42 seconds (269 mGy). COMPLICATIONS: None immediate. TECHNIQUE: Informed written consent was obtained from the patient after a thorough discussion of the procedural risks, benefits and alternatives. All questions were addressed. Maximal Sterile Barrier Technique was utilized including caps, mask, sterile gowns, sterile gloves, sterile drape, hand hygiene and skin antiseptic. A timeout was performed prior to the initiation of the procedure. Ultrasound was used to interrogate the popliteal fossa. The popliteal vein appears completely thrombosed by ultrasound and is noncompressible. Ultrasound was used to interrogate the posterior tibial veins. The posterior tibial veins could not be identified and may be chronically occluded. Ultrasound was used to interrogate the anterior tibial vein. At least 1 of the paired anterior tibial veins is widely patent. Local anesthesia was attained by infiltration with 1% lidocaine. Under real-time sonographic guidance, the anterior tibial  vein was punctured with a 21 gauge micropuncture needle. Using standard technique, the needle was exchanged over a micro wire for a 5 [place] transitional micro sheath which was then exchanged over a short Amplatz wire for a short 6 [place] vascular sheath. Venography was performed through the 6 French sheath. The anterior tibial vein is patent. There is either a patent channel through the popliteal vein, or a venous collateral running parallel with the popliteal vein which is patent. A wire was advanced through the popliteal vein. Attention was again directed to the popliteal fossa. Local anesthesia was attained by infiltration with 1% lidocaine. Under real-time sonographic guidance, the popliteal vein was punctured with a 21 gauge micropuncture needle. Using standard technique, the needle was exchanged over a micro wire for a 5 [place] transitional micro sheath. The nerve she was performed. There appears to be a patent channel in the popliteal vein with chronic thrombus. Additional venography was performed of the right lower extremity. There are numerous collaterals throughout the right lower extremity. 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 Using a Glidewire and angled catheter the catheter was successfully advanced to the level of the lesser trochanter. Additional venography was performed. There is acute appearing thrombus in the femoral vein in the proximal thigh and common femoral vein at the femoral head. Thrombus extends into the profunda femoral vein. The catheter was successfully advanced into the iliac vein. 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 With some difficulty, the wire was eventually advanced beyond this occlusion and into the inferior vena cava. An inferior vena cavagram was performed. The  inferior vena cava is patent. Inflow from the left iliac vein is not visualized. The catheter was brought back across the stenosis and venography performed confirming iliac vein compression with likely some chronic thrombus. A Rosen wire was advanced into the inferior vena cava. The 5 French angled catheter was removed. A 135 cm total length, 50 cm infusion length ultrasound assisted infusion catheter was advanced over the Rosen wire and positioned from the region of iliac vein compression into the femoral vein in the distal thigh. Thrombolysis was then initiated with tPA at 1 milligram/hour. FINDINGS:  1. 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 Acute thrombus within the femoral vein in the proximal thigh and the common femoral vein 4. 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 Lysis check tomorrow. Patient will likely require stent placement for iliac vein compression. Signed, Criselda Peaches, MD [facility] Electronically Signed   By: Jacqulynn Cadet M.D.   On: 10/03/2015 18:08   Ir Mariana Arn Ivc  Result Date: 10/03/2015 INDICATION: 65 year old female with a 3 month history of left lower extremity pain and swelling. She was recently diagnosed with extensive bilateral lower extremity DVT. However, only the left lower extremity is symptomatic. She has not experienced relief despite several days of heparinization. She presents for catheter directed thrombolysis. EXAM: IR INFUSION THROMBOL VENOUS INITIAL (MS); IR ULTRASOUND GUIDANCE VASC ACCESS LEFT; LEFT EXTREMITY VENOGRAPHY; INFERIOR VENA  CAVOGRAM 1. Ultrasound-guided access anterior tibial vein 2. Left lower extremity venogram 3. Ultrasound-guided access popliteal vein 4. Inferior vena cavagram 5. Initiation of venous thrombolysis COMPARISON:  None. MEDICATIONS: None. ANESTHESIA/SEDATION: Versed 1.5 mg IV; Fentanyl 75 mcg IV Moderate Sedation Time:  42 minutes The patient was continuously monitored during the procedure by the interventional radiology nurse under my direct supervision. FLUOROSCOPY TIME:  Fluoroscopy Time: 8 minutes 42 seconds (269 mGy). COMPLICATIONS: None immediate. TECHNIQUE: Informed written consent was obtained from the patient after a thorough discussion of the procedural risks, benefits and alternatives. All questions were addressed. Maximal Sterile Barrier Technique was utilized including caps, mask, sterile gowns, sterile gloves, sterile drape, hand hygiene and skin antiseptic. A timeout was performed prior to the initiation of the procedure. Ultrasound was used to interrogate the popliteal fossa. The popliteal vein appears completely thrombosed by ultrasound and is noncompressible. Ultrasound was used to interrogate the posterior tibial veins. The posterior tibial veins could not be identified and may be chronically occluded. Ultrasound was used to interrogate the anterior tibial vein. At least 1 of the paired anterior tibial veins is widely patent. Local anesthesia was attained by infiltration with 1% lidocaine. Under real-time sonographic guidance, the anterior tibial vein was punctured with a 21 gauge micropuncture needle. Using standard technique, the needle was exchanged over a micro wire for a 5 [place] transitional micro sheath which was then exchanged over a short Amplatz wire for a short 6 [place] vascular sheath. Venography was performed through the 6 French sheath. The anterior tibial vein is patent. There is either a patent channel through the popliteal vein, or a venous collateral running parallel with the  popliteal vein which is patent. A wire was advanced through the popliteal vein. Attention was again directed to the popliteal fossa. Local anesthesia was attained by infiltration with 1% lidocaine. Under real-time sonographic guidance, the popliteal vein was punctured with a 21 gauge micropuncture needle. Using standard technique, the needle was exchanged over a micro wire for a 5 [place] transitional micro sheath. The nerve she was performed. There appears to be a patent channel in the popliteal vein with chronic thrombus. Additional venography was performed of the right lower extremity. There are numerous collaterals throughout the right lower extremity. There is chronic occlusion of the femoral vein in the proximal and mid  thigh. Using a Glidewire and angled catheter the catheter was successfully advanced to the level of the lesser trochanter. Additional venography was performed. There is acute appearing thrombus in the femoral vein in the proximal thigh and common femoral vein at the femoral head. Thrombus extends into the profunda femoral vein. The catheter was successfully advanced into the iliac vein. 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 With some difficulty, the wire was eventually advanced beyond this occlusion and into the inferior vena cava. An inferior vena cavagram was performed. The inferior vena cava is patent. Inflow from the left iliac vein is not visualized. The catheter was brought back across the stenosis and venography performed confirming iliac vein compression with likely some chronic thrombus. A Rosen wire was advanced into the inferior vena cava. The 5 French angled catheter was removed. A 135 cm total length, 50 cm infusion length ultrasound assisted infusion catheter was advanced over the Rosen wire and positioned from the  region of iliac vein compression into the femoral vein in the distal thigh. Thrombolysis was then initiated with tPA at 1 milligram/hour. FINDINGS: 1. 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 Acute thrombus within the femoral vein in the proximal thigh and the common femoral vein 4. 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 Lysis check tomorrow. Patient will likely require stent placement for iliac vein compression. Signed, Criselda Peaches, MD [facility] Electronically Signed   By: Jacqulynn Cadet M.D.   On: 10/03/2015 18:08   Ir Thrombect Veno Mech Mod Sed  Result Date: 10/04/2015 INDICATION: 65 year old female with 3 months history of left lower extremity pain and swelling in, recently diagnosed with extensive bilateral lower extremity DVT, however only her left lower extremity is symptomatic. Patient failed conservative management with hyper does a shin and as such weekend catheter directed thrombolysis on 10/03/2015. Patient presents today for repeat lower extremity venogram and potential intervention EXAM: 1. IR THROMBO FOLLOW-UP EVAL VENOUS SUBSEQUENT DAY 2. LEFT LOWER EXTREMITY VENOUS ANGIOPLASTY 3. FLUOROSCOPIC GUIDED DEPLOYMENT OF A VENOUS STENT. COMPARISON:  Initiation of catheter directed thrombolysis - 10/03/2015 MEDICATIONS: None CONTRAST:  130 cc Isovue 300 ANESTHESIA/SEDATION: None FLUOROSCOPY TIME:  11 minutes 24 seconds (123XX123 mGy) COMPLICATIONS: None immediate. TECHNIQUE: Informed written consent was obtained from the patient after a discussion of the risks, benefits and  alternatives to treatment. Questions regarding the procedure were encouraged and answered. A timeout was performed prior to the initiation of the procedure. The patient was placed prone on the fluoroscopy table and the external portion of the left popliteal vein approach vascular sheath as well as the surrounding skin were prepped and draped in the usual sterile fashion, and a sterile drape was applied covering the operative field. Maximum barrier sterile technique with sterile gowns and gloves were used for the procedure. Initial left lower extremity venogram was performed through the side arm of the vascular sheath. The infusion wire was removed and the infusion catheter was a cannulated with an exchange length Rosen wire which was advanced to the level  of the cranial aspect of the IVC. A vertebral catheter was advanced into the caudal aspect of the IVC and pullback venograms were performed through the level of the left femoral head. Next, the 6 French popliteal vein approach vascular sheath was exchanged for a 10 [place] vascular sheath which was advanced to the level of the mid femoral aspect of the left superficial femoral vein. Mechanical thrombectomy was performed with the use of an Angiojet device from the level of the left common and external iliac veins extending to the left common femoral vein. Prolonged balloon angioplasty was performed at multiple stations beginning at the level of the mid left superficial femoral vein extending to the left common iliac veins confluence with the IVC. Post angioplasty venograms were performed. Next, an 16 x 90 mm Wall stent was deployed extending from the caudal aspect of the IVC to the mid aspect of the left external iliac vein. The stent was angioplastied at multiple stations with a 14 mm x 4 cm Atlas angioplasty balloon. Post stent deployment venogram images were performed through the vascular sheath. Mechanical thrombectomy was again performed with the use of an  Angiojet device at location of a minimal amount of residual nonocclusive thrombus at the confluence of the left deep and superficial femoral veins. Prolonged balloon angioplasty was again performed at multiple stations within 8 mm x 8 cm Mustang Balloon extending from the level of the mid femoral vein to the peripheral aspect of the venous stent. Completion left lower extremity central and pelvic venogram images were obtained. Images were reviewed and the procedure was terminated. All wires, catheters and sheaths were removed from the patient. Hemostasis was achieved at both the left popliteal and calf venous access site with manual compression. Dressings were placed. The patient tolerated the procedure well without immediate postprocedural complication. FINDINGS: Initial left lower extremity venogram demonstrates wide patency of the cranial aspect of the left popliteal vein as well as the distal aspect of the left superficial femoral vein. 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 Pelvic venogram demonstrates improved patency of both the left common and external iliac veins with a minimal amount of residual nonocclusive thrombus within the peripheral aspect of the left external iliac vein. 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 Successful prolonged balloon angioplasty of the mid / proximal aspects of the left femoral vein as well as the left common femoral vein with post angioplasty venogram images demonstrating restored antegrade flow from the level of the mid left femoral vein to the IVC. Severe narrowing of the origin of the left common iliac vein was successfully treated with deployment of 16 mm x 9 cm Wall stent which was angioplastied to 14 mm diameter.  Completion images demonstrate restored patency and antegrade flow from the level of the left mid femoral vein to the IVC. IMPRESSION: 1. 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 2. 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 PLAN: - Bed rest for 2 hours with left lower extremity maintain flat. Then may ambulate as tolerated. - Would continue IV  heparin until patient is therapeutically anticoagulated with Lovenox. - Would recommend Lovenox anti coagulation for at least the next 4-6 weeks prior to converting to an oral anticoagulant. - Would recommend bilateral thigh-high compression stockings can at least 6 months of anti coagulation. - The patient will be given an appointment to be evaluated at the interventional radiology clinic [phone]) in approximately 4 weeks at which time a bilateral lower extremity venous Doppler ultrasound will be obtained. Electronically Signed   By: Sandi Mariscal M.D.   On: 10/04/2015 17:37   Ir US Guide Vasc Access Left  Result Date: 10/03/2015 INDICATION: 65 year old female with a 3 month history of left lower extremity pain and swelling. She was recently diagnosed with extensive bilateral lower extremity DVT. However, only the left lower extremity is symptomatic. She has not experienced relief despite several days of heparinization. She presents for catheter directed thrombolysis. EXAM: IR INFUSION THROMBOL VENOUS INITIAL (MS); IR ULTRASOUND GUIDANCE VASC ACCESS LEFT; LEFT EXTREMITY VENOGRAPHY; INFERIOR VENA CAVOGRAM 1. Ultrasound-guided access anterior tibial vein 2. Left lower extremity venogram 3. Ultrasound-guided access popliteal vein 4. Inferior vena cavagram 5. Initiation of venous thrombolysis COMPARISON:  None. MEDICATIONS: None.  ANESTHESIA/SEDATION: Versed 1.5 mg IV; Fentanyl 75 mcg IV Moderate Sedation Time:  42 minutes The patient was continuously monitored during the procedure by the interventional radiology nurse under my direct supervision. FLUOROSCOPY TIME:  Fluoroscopy Time: 8 minutes 42 seconds (269 mGy). COMPLICATIONS: None immediate. TECHNIQUE: Informed written consent was obtained from the patient after a thorough discussion of the procedural risks, benefits and alternatives. All questions were addressed. Maximal Sterile Barrier Technique was utilized including caps, mask, sterile gowns, sterile gloves, sterile drape, hand hygiene and skin antiseptic. A timeout was performed prior to the initiation of the procedure. Ultrasound was used to interrogate the popliteal fossa. The popliteal vein appears completely thrombosed by ultrasound and is noncompressible. Ultrasound was used to interrogate the posterior tibial veins. The posterior tibial veins could not be identified and may be chronically occluded. Ultrasound was used to interrogate the anterior tibial vein. At least 1 of the paired anterior tibial veins is widely patent. Local anesthesia was attained by infiltration with 1% lidocaine. Under real-time sonographic guidance, the anterior tibial vein was punctured with a 21 gauge micropuncture needle. Using standard technique, the needle was exchanged over a micro wire for a 5 [place] transitional micro sheath which was then exchanged over a short Amplatz wire for a short 6 [place] vascular sheath. Venography was performed through the 6 French sheath. The anterior tibial vein is patent. There is either a patent channel through the popliteal vein, or a venous collateral running parallel with the popliteal vein which is patent. A wire was advanced through the popliteal vein. Attention was again directed to the popliteal fossa. Local anesthesia was attained by infiltration with 1% lidocaine. Under real-time sonographic guidance, the  popliteal vein was punctured with a 21 gauge micropuncture needle. Using standard technique, the needle was exchanged over a micro wire for a 5 [place] transitional micro sheath. The nerve she was performed. There appears to be a patent channel in the popliteal vein with chronic thrombus. Additional venography was performed of the right lower extremity. There are numerous collaterals throughout the right lower extremity. 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 Using a Glidewire and angled catheter the catheter was successfully advanced to the level of the lesser trochanter. Additional venography was performed. There is acute appearing thrombus in the  femoral vein in the proximal thigh and common femoral vein at the femoral head. Thrombus extends into the profunda femoral vein. The catheter was successfully advanced into the iliac vein. 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 With some difficulty, the wire was eventually advanced beyond this occlusion and into the inferior vena cava. An inferior vena cavagram was performed. The inferior vena cava is patent. Inflow from the left iliac vein is not visualized. The catheter was brought back across the stenosis and venography performed confirming iliac vein compression with likely some chronic thrombus. A Rosen wire was advanced into the inferior vena cava. The 5 French angled catheter was removed. A 135 cm total length, 50 cm infusion length ultrasound assisted infusion catheter was advanced over the Rosen wire and positioned from the region of iliac vein compression into the femoral vein in the distal thigh. Thrombolysis was then initiated with tPA at 1 milligram/hour. FINDINGS: 1. 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 Acute thrombus within the femoral vein in the proximal thigh and the common femoral vein 4. 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 Lysis check tomorrow. Patient will likely require stent placement for iliac vein compression. Signed, Criselda Peaches, MD [facility] Radiology Electronically Signed   By: Jacqulynn Cadet M.D.   On: 10/03/2015 18:08   Ir US Guide Vasc Access Left  Result Date: 10/03/2015 INDICATION: 65 year old female with a 3 month history of left lower extremity pain and swelling. She was recently diagnosed with extensive bilateral lower extremity DVT. However, only the left lower extremity is symptomatic. She has not experienced relief despite several days of heparinization. She presents for catheter directed thrombolysis. EXAM: IR INFUSION THROMBOL VENOUS INITIAL (MS); IR ULTRASOUND GUIDANCE VASC ACCESS LEFT; LEFT EXTREMITY VENOGRAPHY; INFERIOR VENA CAVOGRAM 1. Ultrasound-guided access anterior tibial vein 2. Left lower extremity venogram 3. Ultrasound-guided access popliteal vein 4. Inferior vena cavagram 5. Initiation of venous thrombolysis COMPARISON:  None. MEDICATIONS: None. ANESTHESIA/SEDATION: Versed 1.5 mg IV; Fentanyl 75 mcg IV Moderate Sedation Time:  42 minutes The patient was continuously monitored during the procedure by the interventional radiology nurse under my direct supervision. FLUOROSCOPY TIME:  Fluoroscopy Time: 8 minutes 42 seconds (269 mGy). COMPLICATIONS: None immediate. TECHNIQUE: Informed written consent was obtained from the patient after a thorough discussion of the procedural risks, benefits and  alternatives. All questions were addressed. Maximal Sterile Barrier Technique was utilized including caps, mask, sterile gowns, sterile gloves, sterile drape, hand hygiene and skin antiseptic. A timeout was performed prior to the initiation of the procedure. Ultrasound was used to interrogate the popliteal fossa. The popliteal vein appears completely thrombosed by ultrasound and is noncompressible. Ultrasound was used to interrogate the posterior tibial veins. The posterior tibial veins could not be identified and may be chronically occluded. Ultrasound was used to interrogate the anterior tibial vein. At least 1 of the paired anterior tibial veins is widely patent. Local anesthesia was attained by infiltration with 1% lidocaine. Under real-time sonographic guidance, the anterior tibial vein was punctured with a  21 gauge micropuncture needle. Using standard technique, the needle was exchanged over a micro wire for a 5 [place] transitional micro sheath which was then exchanged over a short Amplatz wire for a short 6 [place] vascular sheath. Venography was performed through the 6 French sheath. The anterior tibial vein is patent. There is either a patent channel through the popliteal vein, or a venous collateral running parallel with the popliteal vein which is patent. A wire was advanced through the popliteal vein. Attention was again directed to the popliteal fossa. Local anesthesia was attained by infiltration with 1% lidocaine. Under real-time sonographic guidance, the popliteal vein was punctured with a 21 gauge micropuncture needle. Using standard technique, the needle was exchanged over a micro wire for a 5 [place] transitional micro sheath. The nerve she was performed. There appears to be a patent channel in the popliteal vein with chronic thrombus. Additional venography was performed of the right lower extremity. There are numerous collaterals throughout the right lower extremity. There is chronic occlusion of  the femoral vein in the proximal and mid thigh. Using a Glidewire and angled catheter the catheter was successfully advanced to the level of the lesser trochanter. Additional venography was performed. There is acute appearing thrombus in the femoral vein in the proximal thigh and common femoral vein at the femoral head. Thrombus extends into the profunda femoral vein. The catheter was successfully advanced into the iliac vein. 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 With some difficulty, the wire was eventually advanced beyond this occlusion and into the inferior vena cava. An inferior vena cavagram was performed. The inferior vena cava is patent. Inflow from the left iliac vein is not visualized. The catheter was brought back across the stenosis and venography performed confirming iliac vein compression with likely some chronic thrombus. A Rosen wire was advanced into the inferior vena cava. The 5 French angled catheter was removed. A 135 cm total length, 50 cm infusion length ultrasound assisted infusion catheter was advanced over the Rosen wire and positioned from the region of iliac vein compression into the femoral vein in the distal thigh. Thrombolysis was then initiated with tPA at 1 milligram/hour. FINDINGS: 1. 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 Acute thrombus within the femoral vein in the proximal thigh and the common femoral vein 4. 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 Lysis  check tomorrow. Patient will likely require stent placement for iliac vein compression. Signed, Criselda Peaches, MD [facility] Radiology Electronically Signed   By: Jacqulynn Cadet M.D.   On: 10/03/2015 18:08   Ir [employer] Stent 1st Art Not Therese Sarah Car Vert Car  Result Date: 10/04/2015 INDICATION: 65 year old female with 3 months history of left lower extremity pain and swelling in, recently diagnosed with extensive bilateral lower extremity DVT, however only her left lower extremity is symptomatic. Patient failed conservative management with hyper does a shin and as such weekend catheter directed thrombolysis on 10/03/2015. Patient presents today for repeat lower extremity venogram and potential intervention EXAM: 1. IR  THROMBO FOLLOW-UP EVAL VENOUS SUBSEQUENT DAY 2. LEFT LOWER EXTREMITY VENOUS ANGIOPLASTY 3. FLUOROSCOPIC GUIDED DEPLOYMENT OF A VENOUS STENT. COMPARISON:  Initiation of catheter directed thrombolysis - 10/03/2015 MEDICATIONS: None CONTRAST:  130 cc Isovue 300 ANESTHESIA/SEDATION: None FLUOROSCOPY TIME:  11 minutes 24 seconds (123XX123 mGy) COMPLICATIONS: None immediate. TECHNIQUE: Informed written consent was obtained from the patient after a discussion of the risks, benefits and alternatives to treatment. Questions regarding the procedure were encouraged and answered. A timeout was performed prior to the initiation of the procedure. The patient was placed prone on the fluoroscopy table and the external portion of the left popliteal vein approach vascular sheath as well as the surrounding skin were prepped and draped in the usual sterile fashion, and a sterile drape was applied covering the operative field. Maximum barrier sterile technique with sterile gowns and gloves were used for the procedure. Initial left lower extremity venogram was performed through the side arm of the vascular sheath. The infusion wire was removed and the infusion  catheter was a cannulated with an exchange length Rosen wire which was advanced to the level of the cranial aspect of the IVC. A vertebral catheter was advanced into the caudal aspect of the IVC and pullback venograms were performed through the level of the left femoral head. Next, the 6 French popliteal vein approach vascular sheath was exchanged for a 10 [place] vascular sheath which was advanced to the level of the mid femoral aspect of the left superficial femoral vein. Mechanical thrombectomy was performed with the use of an Angiojet device from the level of the left common and external iliac veins extending to the left common femoral vein. Prolonged balloon angioplasty was performed at multiple stations beginning at the level of the mid left superficial femoral vein extending to the left common iliac veins confluence with the IVC. Post angioplasty venograms were performed. Next, an 16 x 90 mm Wall stent was deployed extending from the caudal aspect of the IVC to the mid aspect of the left external iliac vein. The stent was angioplastied at multiple stations with a 14 mm x 4 cm Atlas angioplasty balloon. Post stent deployment venogram images were performed through the vascular sheath. Mechanical thrombectomy was again performed with the use of an Angiojet device at location of a minimal amount of residual nonocclusive thrombus at the confluence of the left deep and superficial femoral veins. Prolonged balloon angioplasty was again performed at multiple stations within 8 mm x 8 cm Mustang Balloon extending from the level of the mid femoral vein to the peripheral aspect of the venous stent. Completion left lower extremity central and pelvic venogram images were obtained. Images were reviewed and the procedure was terminated. All wires, catheters and sheaths were removed from the patient. Hemostasis was achieved at both the left popliteal and calf venous access site with manual compression. Dressings were placed.  The patient tolerated the procedure well without immediate postprocedural complication. FINDINGS: Initial left lower extremity venogram demonstrates wide patency of the cranial aspect of the left popliteal vein as well as the distal aspect of the left superficial femoral vein. 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 Pelvic venogram demonstrates improved patency of both the left common and external iliac veins with a minimal amount of residual nonocclusive thrombus within the peripheral aspect of the left external iliac vein. 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 Successful prolonged balloon angioplasty of the mid / proximal aspects of the left femoral vein as well as the left common femoral vein with post angioplasty venogram images demonstrating restored antegrade flow from the level of the mid left femoral vein to the IVC. Severe narrowing of the origin of the left common iliac vein was successfully treated with deployment of 16 mm x 9 cm Wall stent which was angioplastied to 14 mm diameter. Completion images demonstrate restored patency and antegrade flow from the level of the left mid femoral vein to the IVC. IMPRESSION: 1. 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 2. 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 PLAN: - Bed rest for 2 hours with left lower extremity maintain flat. Then may ambulate as tolerated. - Would continue IV heparin until patient is therapeutically anticoagulated with Lovenox. - Would recommend  Lovenox anti coagulation for at least the next 4-6 weeks prior to converting to an oral anticoagulant. - Would recommend bilateral thigh-high compression stockings can at least 6 months of anti coagulation. - The patient will be given an appointment to be evaluated at the interventional radiology clinic [phone]) in approximately 4 weeks at which time a bilateral lower extremity venous Doppler ultrasound will be obtained. Electronically Signed   By: Sandi Mariscal M.D.   On: 10/04/2015 17:37   Ir Infusion Thrombol Venous Initial (ms)  Result Date: 10/03/2015 INDICATION: 65 year old female with a 3 month history of left lower extremity pain and swelling. She was recently diagnosed with extensive bilateral lower extremity DVT. However, only the left lower extremity is symptomatic. She has not experienced relief despite several days of heparinization. She presents for catheter directed thrombolysis. EXAM: IR INFUSION THROMBOL VENOUS INITIAL (MS); IR ULTRASOUND GUIDANCE VASC ACCESS LEFT; LEFT EXTREMITY VENOGRAPHY; INFERIOR VENA CAVOGRAM 1. Ultrasound-guided access anterior tibial vein 2. Left lower extremity venogram 3. Ultrasound-guided access popliteal vein 4. Inferior vena cavagram 5. Initiation of venous thrombolysis COMPARISON:  None. MEDICATIONS: None. ANESTHESIA/SEDATION: Versed 1.5 mg IV; Fentanyl 75 mcg IV Moderate Sedation Time:  42 minutes The patient was continuously monitored during the procedure by the interventional radiology nurse under my direct supervision. FLUOROSCOPY TIME:  Fluoroscopy Time: 8 minutes 42 seconds (269 mGy). COMPLICATIONS: None immediate. TECHNIQUE: Informed written consent was obtained from the patient after a thorough discussion of the procedural risks, benefits and alternatives. All questions were addressed. Maximal Sterile Barrier Technique was utilized including caps, mask, sterile gowns, sterile gloves, sterile drape, hand hygiene and skin antiseptic. A timeout was performed  prior to the initiation of the procedure. Ultrasound was used to interrogate the popliteal fossa. The popliteal vein appears completely thrombosed by ultrasound and is noncompressible. Ultrasound was used to interrogate the posterior tibial veins. The posterior tibial veins could not be identified and may be chronically occluded. Ultrasound was used to interrogate the anterior tibial vein. At least 1 of the paired anterior tibial veins is widely patent. Local anesthesia was attained by infiltration with 1% lidocaine. Under real-time sonographic guidance, the anterior tibial vein was punctured with a 21 gauge micropuncture needle. Using standard technique, the needle was exchanged over a micro wire for a 5 [place] transitional micro sheath which was  then exchanged over a short Amplatz wire for a short 6 [place] vascular sheath. Venography was performed through the 6 French sheath. The anterior tibial vein is patent. There is either a patent channel through the popliteal vein, or a venous collateral running parallel with the popliteal vein which is patent. A wire was advanced through the popliteal vein. Attention was again directed to the popliteal fossa. Local anesthesia was attained by infiltration with 1% lidocaine. Under real-time sonographic guidance, the popliteal vein was punctured with a 21 gauge micropuncture needle. Using standard technique, the needle was exchanged over a micro wire for a 5 [place] transitional micro sheath. The nerve she was performed. There appears to be a patent channel in the popliteal vein with chronic thrombus. Additional venography was performed of the right lower extremity. There are numerous collaterals throughout the right lower extremity. 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 Using a Glidewire and angled catheter the catheter was successfully advanced to the level of the lesser trochanter. Additional venography was performed. There is acute  appearing thrombus in the femoral vein in the proximal thigh and common femoral vein at the femoral head. Thrombus extends into the profunda femoral vein. The catheter was successfully advanced into the iliac vein. 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 With some difficulty, the wire was eventually advanced beyond this occlusion and into the inferior vena cava. An inferior vena cavagram was performed. The inferior vena cava is patent. Inflow from the left iliac vein is not visualized. The catheter was brought back across the stenosis and venography performed confirming iliac vein compression with likely some chronic thrombus. A Rosen wire was advanced into the inferior vena cava. The 5 French angled catheter was removed. A 135 cm total length, 50 cm infusion length ultrasound assisted infusion catheter was advanced over the Rosen wire and positioned from the region of iliac vein compression into the femoral vein in the distal thigh. Thrombolysis was then initiated with tPA at 1 milligram/hour. FINDINGS: 1. 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 Acute thrombus within the femoral vein in the proximal thigh and the common femoral vein 4. 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 Lysis check tomorrow. Patient will likely require stent placement for iliac vein compression. Signed, Criselda Peaches, MD [facility] Radiology Electronically Signed   By:  Jacqulynn Cadet M.D.   On: 10/03/2015 18:08   Ir Jacolyn Reedy F/u Eval Art/ven Final Day (ms)  Result Date: 10/04/2015 INDICATION: 65 year old female with 3 months history of left lower extremity pain and swelling in, recently diagnosed with extensive bilateral lower extremity DVT, however only her left lower extremity is symptomatic. Patient failed conservative management with hyper does a shin and as such weekend catheter directed thrombolysis on 10/03/2015. Patient presents today for repeat lower extremity venogram and potential intervention EXAM: 1. IR THROMBO FOLLOW-UP EVAL VENOUS SUBSEQUENT DAY 2. LEFT LOWER EXTREMITY VENOUS ANGIOPLASTY 3. FLUOROSCOPIC GUIDED DEPLOYMENT OF A VENOUS STENT. COMPARISON:  Initiation of catheter directed thrombolysis -  10/03/2015 MEDICATIONS: None CONTRAST:  130 cc Isovue 300 ANESTHESIA/SEDATION: None FLUOROSCOPY TIME:  11 minutes 24 seconds (123XX123 mGy) COMPLICATIONS: None immediate. TECHNIQUE: Informed written consent was obtained from the patient after a discussion of the risks, benefits and alternatives to treatment. Questions regarding the procedure were encouraged and answered. A timeout was performed prior to the initiation of the procedure. The patient was placed prone on the fluoroscopy table and the external portion of the left popliteal vein approach vascular sheath as well as the surrounding skin were prepped and draped in the usual sterile fashion, and a sterile drape was applied covering the operative field. Maximum barrier sterile technique with sterile gowns and gloves were used for the procedure. Initial left lower extremity venogram was performed through the side arm of the vascular sheath. The infusion wire was removed and the infusion catheter was a cannulated with an exchange length Rosen wire which was advanced to the level of the cranial aspect of the IVC. A vertebral catheter was advanced into the caudal aspect of the IVC and pullback venograms were performed  through the level of the left femoral head. Next, the 6 French popliteal vein approach vascular sheath was exchanged for a 10 [place] vascular sheath which was advanced to the level of the mid femoral aspect of the left superficial femoral vein. Mechanical thrombectomy was performed with the use of an Angiojet device from the level of the left common and external iliac veins extending to the left common femoral vein. Prolonged balloon angioplasty was performed at multiple stations beginning at the level of the mid left superficial femoral vein extending to the left common iliac veins confluence with the IVC. Post angioplasty venograms were performed. Next, an 16 x 90 mm Wall stent was deployed extending from the caudal aspect of the IVC to the mid aspect of the left external iliac vein. The stent was angioplastied at multiple stations with a 14 mm x 4 cm Atlas angioplasty balloon. Post stent deployment venogram images were performed through the vascular sheath. Mechanical thrombectomy was again performed with the use of an Angiojet device at location of a minimal amount of residual nonocclusive thrombus at the confluence of the left deep and superficial femoral veins. Prolonged balloon angioplasty was again performed at multiple stations within 8 mm x 8 cm Mustang Balloon extending from the level of the mid femoral vein to the peripheral aspect of the venous stent. Completion left lower extremity central and pelvic venogram images were obtained. Images were reviewed and the procedure was terminated. All wires, catheters and sheaths were removed from the patient. Hemostasis was achieved at both the left popliteal and calf venous access site with manual compression. Dressings were placed. The patient tolerated the procedure well without immediate postprocedural complication. FINDINGS: Initial left lower extremity venogram demonstrates wide patency of the cranial aspect of the left popliteal vein as well as the distal  aspect of the left superficial femoral vein. 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 Pelvic venogram demonstrates improved patency of both the left common and external iliac veins with a minimal amount of residual nonocclusive thrombus within the peripheral aspect of the left external iliac vein. 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 Successful prolonged balloon angioplasty of the mid / proximal aspects of  the left femoral vein as well as the left common femoral vein with post angioplasty venogram images demonstrating restored antegrade flow from the level of the mid left femoral vein to the IVC. Severe narrowing of the origin of the left common iliac vein was successfully treated with deployment of 16 mm x 9 cm Wall stent which was angioplastied to 14 mm diameter. Completion images demonstrate restored patency and antegrade flow from the level of the left mid femoral vein to the IVC. IMPRESSION: 1. 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 2. 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 PLAN: - Bed rest for 2 hours with left lower extremity maintain flat. Then may ambulate as tolerated. - Would continue IV heparin until patient is therapeutically anticoagulated with Lovenox. - Would recommend Lovenox anti coagulation for at least the next 4-6 weeks prior to converting to an oral anticoagulant. - Would recommend bilateral thigh-high compression stockings can at least 6 months of anti coagulation. - The patient will be given  an appointment to be evaluated at the interventional radiology clinic [phone]) in approximately 4 weeks at which time a bilateral lower extremity venous Doppler ultrasound will be obtained. Electronically Signed   By: Sandi Mariscal M.D.   On: 10/04/2015 17:37    Labs:  CBC:  Recent Labs  10/04/15 1200 10/05/15 0627 10/06/15 0217 10/07/15 0715  WBC 5.7 7.8 7.4 5.6  HGB 10.8* 11.9* 11.2* 10.8*  HCT 33.6* 36.9 34.9* 34.3*  PLT 189 180 197 197    COAGS:  Recent Labs  09/30/15 1912  10/03/15 0329 10/03/15 1046 10/03/15 1851 10/03/15 2040 10/04/15 0500  INR 1.10  --  1.86  --   --   --   --   APTT  --   < > 88* 103* >200* 45* 36  < > = values in this interval not displayed.  BMP:  Recent Labs  10/03/15 0329 10/04/15 0500 10/06/15 0217 10/07/15 0134  NA 133* 137 135 135  K 4.4 4.0 3.9 3.7  CL 99* 99* 98* 98*  CO2 26 30 31 30   GLUCOSE 205* 157* 159* 178*  BUN 15 12 16 17   CALCIUM 9.7 9.4 9.5 9.7  CREATININE 1.16* 0.91 1.08* 1.20*  GFRNONAA 48* >60 53* 46*  GFRAA 56* >60 >60 54*    LIVER FUNCTION TESTS:  Recent Labs  05/06/15 1843 09/30/15 1659 10/03/15 0329 10/06/15 0217  BILITOT 0.7 0.6 0.5  --   AST 25 19 20   --   ALT 24 15 16   --   ALKPHOS 55 78 62  --   PROT 7.7 8.5* 7.8  --   ALBUMIN 3.7 3.7 3.4* 3.2*    TUMOR MARKERS: No results for input(s): AFPTM, CEA, CA199, CHROMGRNA in the last 8760 hours.  Assessment and Plan:  [name] is a 65 y.o. 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   The patient is subjectively improved following catheter directed thrombolysis and stent placement however continues to experience asymmetric left lower extremity edema and left knee pain/tightness.    The patient has been  compliant  with her Lovenox injections. She is to transition to Xarelto next week and has filled a prescription for this medication.  Bilateral lower extremity venous ultrasound performed today demonstrates extensive chronic DVT throughout the left lower extremity as well as presumably chronic DVT within the right superficial femoral and popliteal veins (note, the patient remains asymptomatic in regards to her right lower extremity).  Selected images from left lower extremity thrombolysis, angioplasty and stent placement performed 10/04/2015 were reviewed in detail with the patient and the patient's daughter.  I explained that there is a great likelihood that she will experience chronic left lower extremity edema and have encouraged her to main compliance with thigh-high compression garments. In addition, I have prescribed the patient moderate strength panty hose style compression stockings.   Additionally, I have encouraged the patient to increase her activity level (especially walking) as much as possible and to avoid prolonged episodes of sitting.  The patient will be continued on Xarelto for 6 months.  At that time, the patient will be seen in follow-up consultation at the interventional radiology clinic at that time with repeat bilateral lower extremity venous doppler ultrasound.   Given the patient's multiple medical comorbidities as well as sedentary lifestyle, if the patient tolerates oral anticoagulation, consideration for chronic/indefinite anticoagulation may be considered at that time.  The patient and the patient's daughter demonstrated excellent understanding of this discussion.  The patient was encouraged to call the interventional radiology clinic with any interval questions or concerns.  A copy of this report was sent to the requesting provider on this date.  Electronically Signed: Sandi Mariscal 11/02/2015, 12:48 PM   I spent a total of 15 Minutes in face to face in clinical  consultation, greater than 50% of which was counseling/coordinating care for left dorsum DVT, post catheter directed thrombolysis

## 2015-11-04 ENCOUNTER — Encounter: Payer: Self-pay | Admitting: Diagnostic Neuroimaging

## 2015-11-04 ENCOUNTER — Ambulatory Visit (INDEPENDENT_AMBULATORY_CARE_PROVIDER_SITE_OTHER): Payer: Medicare HMO | Admitting: Diagnostic Neuroimaging

## 2015-11-04 VITALS — BP 116/63 | HR 64

## 2015-11-04 DIAGNOSIS — R413 Other amnesia: Secondary | ICD-10-CM | POA: Diagnosis not present

## 2015-11-04 DIAGNOSIS — G3184 Mild cognitive impairment, so stated: Secondary | ICD-10-CM | POA: Diagnosis not present

## 2015-11-04 MED FILL — ENOXAPARIN 120 MG/0.8 ML SY: 120 | 12 days supply | Qty: 22 | Fill #1

## 2015-11-04 NOTE — Patient Instructions (Signed)
Thank you for coming to see [name] at [facility]. I hope we have been able to provide you high quality care today.  You may receive a patient satisfaction survey over the next few weeks. We would appreciate your feedback and comments so that we may continue to improve ourselves and the health of our patients.  - I will check CT head   - increase safety and supervision at home  - work with your family on medication organization   ~~~~~~~~~~~~~~~~~~~~~~~~~~~~~~~~~~~~~~~~~~~~~~~~~~~~~~~~~~~~~~~~~  DR. PENUMALLI'S GUIDE TO HAPPY AND HEALTHY LIVING These are some of my general health and wellness recommendations. Some of them may apply to you better than others. Please use common sense as you try these suggestions and feel free to ask me any questions.   ACTIVITY/FITNESS Mental, social, emotional and physical stimulation are very important for brain and body health. Try learning a new activity (arts, music, language, sports, games).  Keep moving your body to the best of your abilities.   NUTRITION Eat more plants: colorful vegetables, nuts, seeds and berries.  Eat less sugar, salt, preservatives and processed foods.  Avoid toxins such as cigarettes and alcohol.  Drink water when you are thirsty. Warm water with a slice of lemon is an excellent morning drink to start the day.  Consider these websites for more information [employer] (https://www.henry-hernandez.biz/) [employer] (WindowBlog.ch)   RELAXATION Consider practicing mindfulness meditation or other relaxation techniques such as deep breathing, prayer, yoga, tai chi, massage. See website mindful.org or the apps Headspace or Calm to help get started.   SLEEP Try to get at least 7-8+ hours sleep per day. Regular exercise and reduced caffeine will help you sleep better. Practice good sleep hygeine techniques. See website sleep.org for more  information.   PLANNING Prepare estate planning, living will, healthcare POA documents. Sometimes this is best planned with the help of an attorney. Theconversationproject.org and agingwithdignity.org are excellent resources.

## 2015-11-04 NOTE — Progress Notes (Signed)
[facility]  PATIENT: [name] DOB: 1950-04-15  REFERRING CLINICIAN: Everardo Beals HISTORY FROM: patient and daughter  REASON FOR VISIT: new consult    HISTORICAL  CHIEF COMPLAINT:  Chief Complaint  Patient presents with  . Memory Loss    rm 7, dgtr- [name] , family has noticed memory issues for a few months"    HISTORY OF PRESENT ILLNESS:   65 year old right-handed female here for evaluation of memory loss. Patient denies any significant memory loss problems and seems annoyed that her daughter and family have suggested that she has a problem. According to the daughter patient has had at least 2-3 months of short-term memory problems, mixing up her medications, forgetting about conversations, confabulating phone calls that did not happen, having difficulty handling money and other short-term issues.  Patient has had decline in her activities of daily living over the past 1 year due to her other general medical issues including hypertension, diabetes, heart disease, hyperkalemia, atrial fibrillation, arthritis and general decline in physical abilities.  Patient lives with her son. Her son has also noticed these memory problems according to patient's daughter.  Patient states that she does not do to much these days. She wakes up in the morning, can fix herself something small to eat and mainly "sits around".   REVIEW OF SYSTEMS: Full 14 system review of systems performed and negative with exception of: Swelling in legs joint pain joint swelling cramps aching muscles.   ALLERGIES: Allergies  Allergen Reactions  . Metformin And Related Itching, Swelling and Other (See Comments)    Leg pain & swelling in legs  . Atorvastatin Itching and Rash  . Levofloxacin Itching    HOME MEDICATIONS: Outpatient Medications Prior to Visit  Medication Sig Dispense Refill  . albuterol (PROVENTIL HFA;VENTOLIN HFA) 108 (90 BASE) MCG/ACT inhaler Inhale 2 puffs into  the lungs every 6 (six) hours as needed for wheezing or shortness of breath.    Marland Kitchen albuterol (PROVENTIL) (2.5 MG/3ML) 0.083% nebulizer solution Take 2.5 mg by nebulization every 6 (six) hours as needed for wheezing or shortness of breath.     Marland Kitchen aspirin EC 81 MG EC tablet Take 1 tablet (81 mg total) by mouth daily. 30 tablet 12  . carvedilol (COREG) 12.5 MG tablet Take 12.5 mg by mouth 2 (two) times daily with a meal.     . enoxaparin (LOVENOX) 120 MG/0.8ML injection Inject 0.8 mLs (120 mg total) into the skin every 12 (twelve) hours. 42 Syringe 1  . fluconazole (DIFLUCAN) 150 MG tablet Take 150 mg by mouth daily.     . furosemide (LASIX) 80 MG tablet Take 1 tablet (80 mg total) by mouth 2 (two) times daily. 180 tablet 3  . glipiZIDE (GLUCOTROL) 5 MG tablet Take 5 mg by mouth daily before breakfast.     . IRON PO Take 1 tablet by mouth daily.    . isosorbide mononitrate (IMDUR) 30 MG 24 hr tablet Take 1 tablet (30 mg total) by mouth daily. 90 tablet 3  . ketoconazole (NIZORAL) 2 % cream Apply 1 application topically 2 (two) times daily.     Marland Kitchen latanoprost (XALATAN) 0.005 % ophthalmic solution Place 1 drop into both eyes at bedtime.    Marland Kitchen lisinopril (PRINIVIL,ZESTRIL) 10 MG tablet Take 10 mg by mouth daily.     . meloxicam (MOBIC) 7.5 MG tablet Take 7.5 mg by mouth daily.    . methocarbamol (ROBAXIN) 500 MG tablet Take 1 tablet (500 mg total) by  mouth every 8 (eight) hours as needed for muscle spasms. 45 tablet 0  . nitroGLYCERIN (NITROSTAT) 0.4 MG SL tablet Place 1 tablet (0.4 mg total) under the tongue every 5 (five) minutes as needed. (Patient taking differently: Place 0.4 mg under the tongue every 5 (five) minutes as needed for chest pain. ) 25 tablet 3  . NOVOLOG FLEXPEN 100 UNIT/ML FlexPen Inject 30-50 Units into the skin See admin instructions. 50 units every morning, 30 units at lunch and 50 units at suppre    . potassium chloride SA (K-DUR,KLOR-CON) 20 MEQ tablet Take 20 mEq by mouth.    .  potassium phosphate, monobasic, (K-PHOS ORIGINAL) 500 MG tablet Take 500 mg by mouth daily as needed (takes when needed for spasms).     . rosuvastatin (CRESTOR) 20 MG tablet Take 1 tablet (20 mg total) by mouth daily at 6 PM. 30 tablet 2  . spironolactone (ALDACTONE) 25 MG tablet Take 0.5 tablets (12.5 mg total) by mouth daily. 30 tablet 11   No facility-administered medications prior to visit.     PAST MEDICAL HISTORY: Past Medical History:  Diagnosis Date  . AICD (automatic cardioverter/defibrillator) present   . Angina decubitus ([place]) 11/06/2012  . Angina pectoris ([place])   . CHF (congestive heart failure) ([place])   . Chronic combined systolic and diastolic CHF, NYHA class 3 ([place])   . Coronary artery disease    s/p CABG 2007  . Diabetes mellitus    type II  . DVT (deep venous thrombosis) ([place]) 09/2015   bilateral  . Hypertension   . Ischemic cardiomyopathy    EF 30-35%, s/p ICD 4/08 by Dr Leonia Reeves  . Morbid obesity ([place]) 11/06/2012  . Obesity   . Oxygen dependent 2 L  . 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 Sleep apnea    uses O2 at night  . Ventricular tachycardia ([place]) 01/16/15   sustained VT terminated with ATP, CL 340 msec    PAST SURGICAL HISTORY: Past Surgical History:  Procedure Laterality Date  . BI-VENTRICULAR IMPLANTABLE CARDIOVERTER DEFIBRILLATOR UPGRADE N/A 09/25/2012   Procedure: BI-VENTRICULAR IMPLANTABLE CARDIOVERTER DEFIBRILLATOR UPGRADE;  Surgeon: Coralyn Mark, MD;  Location: [facility] CATH LAB;  Service: Cardiovascular;  Laterality: N/A;  . CARDIAC CATHETERIZATION    . CARDIAC DEFIBRILLATOR PLACEMENT  4/08   by Dr Leonia Reeves ([facility])  . CORONARY ARTERY BYPASS GRAFT  10/05/2005   by Dr Cyndia Bent  . IMPLANTABLE CARDIOVERTER DEFIBRILLATOR IMPLANT  09/25/12   attempt of upgrade to CRT-D unsuccessful due to CS anatomy, SJM Unify Asaura device placed with LV port capped by Dr Rayann Heman  .  IR GENERIC HISTORICAL  10/03/2015   IR US GUIDE VASC ACCESS LEFT 10/03/2015 Jacqulynn Cadet, MD [facility]  . IR GENERIC HISTORICAL  10/03/2015   IR VENO/EXT/UNI LEFT 10/03/2015 Jacqulynn Cadet, MD [facility]  . IR GENERIC HISTORICAL  10/03/2015   IR VENOCAVAGRAM IVC 10/03/2015 Jacqulynn Cadet, MD [facility]  . IR GENERIC HISTORICAL  10/03/2015   IR INFUSION THROMBOL VENOUS INITIAL (MS) 10/03/2015 Jacqulynn Cadet, MD [facility]  . IR GENERIC HISTORICAL  10/03/2015   IR US GUIDE VASC ACCESS LEFT 10/03/2015 Jacqulynn Cadet, MD [facility]  . IR GENERIC HISTORICAL  10/04/2015   IR THROMBECT VENO MECH MOD SED 10/04/2015 Sandi Mariscal, MD [facility]  . IR GENERIC HISTORICAL  10/04/2015   IR TRANSCATH PLC  STENT 1ST ART NOT LE CV CAR VERT CAR 10/04/2015 Sandi Mariscal, MD [facility]  . IR GENERIC HISTORICAL  10/04/2015   IR THROMB F/U EVAL ART/VEN FINAL DAY (MS) 10/04/2015 Sandi Mariscal, MD [facility]    FAMILY HISTORY: Family History  Problem Relation Age of Onset  . Diabetes Mother 34    died - HTN  . Stroke Mother   . Other      No early family hx of CAD    SOCIAL HISTORY:  Social History   Social History  . Marital status: Legally Separated    Spouse name: N/A  . Number of children: 3  . Years of education: 62   Occupational History  . HAIRDRESSER Unemployed   Social History Main Topics  . Smoking status: Never Smoker  . Smokeless tobacco: Never Used  . Alcohol use No  . Drug use: No  . Sexual activity: Not Currently   Other Topics Concern  . Not on file   Social History Narrative   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   11/04/15 Lives with son    caffeine - coffee, 1-2 cups daily     PHYSICAL EXAM  GENERAL EXAM/CONSTITUTIONAL: Vitals:  Vitals:   11/04/15 1118  BP: 116/63  Pulse: 64     There is no height or weight on file to calculate BMI.  Vision Screening Comments: 11/04/15 could not stand up for exam  Patient is  in no distress; well developed, nourished and groomed; neck is supple  CARDIOVASCULAR:  Examination of carotid arteries is normal; no carotid bruits  Regular rate and rhythm, no murmurs  Examination of peripheral vascular system by observation and palpation is normal  EYES:  Ophthalmoscopic exam of optic discs and posterior segments is normal; no papilledema or hemorrhages  MUSCULOSKELETAL:  Gait, strength, tone, movements noted in Neurologic exam below  NEUROLOGIC: MENTAL STATUS:  MMSE - Mini Mental State Exam 11/04/2015  Orientation to time 5  Orientation to Place 5  Registration 3  Attention/ Calculation 5  Recall 1  Language- name 2 objects 2  Language- repeat 0  Language- follow 3 step command 3  Language- read & follow direction 1  Write a sentence 1  Copy design 1  Total score 27    awake, alert, oriented to person, place and time  recent and remote memory intact  normal attention and concentration  language fluent, comprehension intact, naming intact,   fund of knowledge appropriate  FLAT AFFECT  CRANIAL NERVE:   2nd - no papilledema on fundoscopic exam  2nd, 3rd, 4th, 6th - pupils equal and reactive to light, visual fields full to confrontation, extraocular muscles intact, no nystagmus  5th - facial sensation symmetric  7th - facial strength symmetric  8th - hearing intact  9th - palate elevates symmetrically, uvula midline  11th - shoulder shrug symmetric  12th - tongue protrusion midline  MOTOR:   normal bulk and tone, full strength in the BUE, BLE  EXCEPT SLIGHTLY DECREASED IN LEFT LEG DUE TO KNEE PAIN  SENSORY:   normal and symmetric to light touch, temperature, vibration; EXCEPT ABSENT VIBRATION AT TOES AND ANKLES  COORDINATION:   finger-nose-finger, fine finger movements normal  REFLEXES:   deep tendon reflexes TRACE and symmetric; ABSENT AT ANKLES  GAIT/STATION:   IN WHEEL CHAIR; NEEDS ASSISTANCE TO STAND; UNSTEADY  GAIT    DIAGNOSTIC DATA (LABS, IMAGING, TESTING) - I reviewed patient records, labs, notes, testing and imaging  myself where available.  Lab Results  Component Value Date   WBC 5.6 10/07/2015   HGB 10.8 (L) 10/07/2015   HCT 34.3 (L) 10/07/2015   MCV 92.5 10/07/2015   PLT 197 10/07/2015      Component Value Date/Time   NA 135 10/07/2015 0134   K 3.7 10/07/2015 0134   CL 98 (L) 10/07/2015 0134   CO2 30 10/07/2015 0134   GLUCOSE 178 (H) 10/07/2015 0134   BUN 17 10/07/2015 0134   CREATININE 1.20 (H) 10/07/2015 0134   CREATININE 0.88 09/20/2015 1153   CALCIUM 9.7 10/07/2015 0134   PROT 7.8 10/03/2015 0329   ALBUMIN 3.2 (L) 10/06/2015 0217   AST 20 10/03/2015 0329   ALT 16 10/03/2015 0329   ALKPHOS 62 10/03/2015 0329   BILITOT 0.5 10/03/2015 0329   GFRNONAA 46 (L) 10/07/2015 0134   GFRAA 54 (L) 10/07/2015 0134   Lab Results  Component Value Date   CHOL 90 12/22/2012   HDL 33 (L) 12/22/2012   LDLCALC 40 12/22/2012   TRIG 85 12/22/2012   CHOLHDL 4.2 04/25/2012   Lab Results  Component Value Date   HGBA1C 7.8 (H) 10/02/2015   No results found for: [id number] Lab Results  Component Value Date   TSH 1.786 03/12/2015    04/25/12 CTA head  1.  High-grade distal left vertebral artery and bilateral ICA siphon (distal cavernous/supraclinoid level) atherosclerotic stenoses. 2.  No circle Willis vessel occlusion.  Fairly widespread intracranial atherosclerosis elsewhere but no other hemodynamically significant intracranial artery stenosis identified. 3.  Stable CT appearance of the brain.  No evidence of acute or subacute cortically based infarct.     ASSESSMENT AND PLAN  65 y.o. year old female here with 2-3 months of intermittent and progressive short-term memory loss problems according to patient's family. Neurologic examination and MMSE testing today are unremarkable. Could represent onset of mild cognitive impairment, depression or other underlying neurodegenerative  process. Will check CT scan of the head to rule out structural etiologies. Patient has defibrillator and cannot have MRI.   Dx:  1. Memory loss   2. MCI (mild cognitive impairment)      PLAN: - CT head - monitor symptoms - may consider memantine, donepezil, clinical research study options at next visit - increase safety and supervision at home  Orders Placed This Encounter  Procedures  . CT HEAD WO CONTRAST    Return in about 2 months (around 01/04/2016).    Penni Bombard, MD [id number], [id number] PM Certified in Neurology, Neurophysiology and Neuroimaging  [facility] [address] [place],  [place] [address] [phone]

## 2015-11-17 ENCOUNTER — Telehealth: Payer: Self-pay | Admitting: Cardiology

## 2015-11-17 NOTE — Telephone Encounter (Signed)
Spoke w/ pt and requested that she send a manual transmission b/c her home monitor has not updated in at least 7 days.   

## 2015-11-21 ENCOUNTER — Ambulatory Visit
Admission: RE | Admit: 2015-11-21 | Discharge: 2015-11-21 | Disposition: A | Discharge status: Home / Self Care | Payer: Medicare HMO | Source: Ambulatory Visit | Attending: Diagnostic Neuroimaging | Admitting: Diagnostic Neuroimaging

## 2015-11-21 DIAGNOSIS — R413 Other amnesia: Secondary | ICD-10-CM

## 2015-11-21 DIAGNOSIS — G3184 Mild cognitive impairment, so stated: Secondary | ICD-10-CM

## 2015-11-22 ENCOUNTER — Telehealth: Payer: Self-pay | Admitting: Internal Medicine

## 2015-11-22 NOTE — Telephone Encounter (Signed)
Did not need this encounter °

## 2015-11-24 ENCOUNTER — Telehealth: Payer: Self-pay | Admitting: Cardiology

## 2015-11-24 ENCOUNTER — Ambulatory Visit (INDEPENDENT_AMBULATORY_CARE_PROVIDER_SITE_OTHER): Payer: Medicare HMO

## 2015-11-24 DIAGNOSIS — I5022 Chronic systolic (congestive) heart failure: Secondary | ICD-10-CM

## 2015-11-24 DIAGNOSIS — Z95810 Presence of automatic (implantable) cardiac defibrillator: Secondary | ICD-10-CM

## 2015-11-24 NOTE — Telephone Encounter (Signed)
LMOVM reminding pt to send remote transmission.   

## 2015-11-25 NOTE — Progress Notes (Signed)
EPIC Encounter for ICM Monitoring  Patient Name: [name] is a 65 y.o. female Date: 11/25/2015 Primary Care Physican: Imelda Pillow, NP Primary Cardiologist:Skains Electrophysiologist: Allred Dry Weight:unknown      Heart Failure questions reviewed, pt symptomatic with leg/ankle swelling.  Thoracic impedance abnormal suggesting fluid accumulation since 11/14/2015.  Labs: 10/07/2015 Creatinine 1.20, BUN 17, Potassium 3.7, Sodium 135 10/06/2015 Creatinine 1.08, BUN 16, Potassium 3.9, Sodium 135 10/04/2015 Creatinine 0.91, BUN 12, Potassium 4.0, Sodium 137 10/03/2015 Creatinine 1.16, BUN 15, Potassium 4.4, Sodium 133 10/02/2015 Creatinine 1.27, BUN 15, Potassium 3.9, Sodium 137 10/01/2015 Creatinine 1.07, BUN 13, Potassium 3.5, Sodium 137 09/30/2015 Creatinine 0.98, BUN 13, Potassium 4.0, Sodium 138 09/20/2015 Creatinine 0.88, BUN 13, Potassium 3.9, Sodium 136 05/19/2015 Creatinine 1.11, BUN 16, Potassium 4.1, Sodium 138  Recommendations:  Increase Furosemide to 120 mg in am and 80 mg in pm x 3 days and returned to prescribed dosage of 80 mg bid.  Increase Potassium 20 mEq to 1 tablet bid x 3 days and then return to prescribed dosage of 1 tablet daily.  She verbalized understanding and repeated back instructions.      Follow-up plan: ICM clinic phone appointment on 11/29/2015 to recheck fluid levels.  Copy of ICM check sent to primary cardiologist and device physician.   ICM trend: 11/24/2015       [name], RN 11/25/2015 10:09 AM

## 2015-11-28 ENCOUNTER — Telehealth: Payer: Self-pay | Admitting: *Deleted

## 2015-11-28 NOTE — Telephone Encounter (Signed)
Per Dr Leta Baptist, spoke with patient and informed her that her CT head showed stable imaging results. There are no acute or major findings.  Dr Leta Baptist will continue her current plan.  She verbalized understanding, appreciation.

## 2015-11-29 ENCOUNTER — Encounter: Payer: Self-pay | Admitting: Interventional Radiology

## 2015-11-29 ENCOUNTER — Telehealth: Payer: Self-pay | Admitting: Cardiology

## 2015-11-29 ENCOUNTER — Ambulatory Visit (INDEPENDENT_AMBULATORY_CARE_PROVIDER_SITE_OTHER): Payer: Medicare HMO

## 2015-11-29 DIAGNOSIS — I5022 Chronic systolic (congestive) heart failure: Secondary | ICD-10-CM

## 2015-11-29 DIAGNOSIS — Z95810 Presence of automatic (implantable) cardiac defibrillator: Secondary | ICD-10-CM

## 2015-11-29 NOTE — Telephone Encounter (Signed)
Spoke with pt and reminded pt of remote transmission that is due today. Pt verbalized understanding.   

## 2015-12-08 ENCOUNTER — Telehealth: Payer: Self-pay | Admitting: Cardiology

## 2015-12-08 NOTE — Telephone Encounter (Signed)
Spoke w/ pt and requested that she send a manual transmission b/c her home monitor has not updated in at least 7 days.   

## 2015-12-12 NOTE — Progress Notes (Signed)
No ICM remote transmission received for 11/29/2015 and next scheduled for 12/16/2015.

## 2015-12-14 ENCOUNTER — Telehealth: Payer: Self-pay | Admitting: *Deleted

## 2015-12-14 NOTE — Telephone Encounter (Signed)
Spoke with patient and informed her that her FU needs to be rescheduled due to provider being out of the office. Rescheduled her for 01/25/16. She verbalized understanding.

## 2015-12-16 ENCOUNTER — Ambulatory Visit (INDEPENDENT_AMBULATORY_CARE_PROVIDER_SITE_OTHER): Payer: Medicare HMO

## 2015-12-16 ENCOUNTER — Telehealth: Payer: Self-pay

## 2015-12-16 DIAGNOSIS — Z95810 Presence of automatic (implantable) cardiac defibrillator: Secondary | ICD-10-CM | POA: Diagnosis not present

## 2015-12-16 DIAGNOSIS — I5022 Chronic systolic (congestive) heart failure: Secondary | ICD-10-CM

## 2015-12-16 NOTE — Telephone Encounter (Signed)
Spoke with patient and requested to send ICM remote transmission today by 3 pm to check fluid levels before her office appointment on 12/11 with [name], PA.

## 2015-12-16 NOTE — Progress Notes (Signed)
EPIC Encounter for ICM Monitoring  Patient Name: [name] is a 65 y.o. female Date: 12/16/2015 Primary Care Physican: Imelda Pillow, NP Primary Cardiologist:Skains/Weaver PA Electrophysiologist: Allred Dry Weight:unknown                                              Patient returned call and denied any fluid symptoms.  She stated she does have one leg that stays swollen  Thoracic impedance normal   Labs: 10/07/2015 Creatinine 1.20, BUN 17, Potassium 3.7, Sodium 135 10/06/2015 Creatinine 1.08, BUN 16, Potassium 3.9, Sodium 135 10/04/2015 Creatinine 0.91, BUN 12, Potassium 4.0, Sodium 137 10/03/2015 Creatinine 1.16, BUN 15, Potassium 4.4, Sodium 133 10/02/2015 Creatinine 1.27, BUN 15, Potassium 3.9, Sodium 137 10/01/2015 Creatinine 1.07, BUN 13, Potassium 3.5, Sodium 137 09/30/2015 Creatinine 0.98, BUN 13, Potassium 4.0, Sodium 138 09/20/2015 Creatinine 0.88, BUN 13, Potassium 3.9, Sodium 136 05/19/2015 Creatinine 1.11, BUN 16, Potassium 4.1, Sodium 138  Recommendations: No changes.  Encouraged to call for fluid symptoms and provided direct ICM number.   Follow-up plan: ICM clinic phone appointment on 01/16/2016.  Office appointment with [name], PA on 12/19/2015.   Copy of ICM check sent to primary cardiologist and device physician.   ICM trend: 12/15/2016       Jamie Billings, RN 12/16/2015 12:13 PM

## 2015-12-16 NOTE — Telephone Encounter (Signed)
Remote ICM transmission received.  Attempted patient call and left detailed message regarding transmission and next ICM scheduled for 01/16/2016.  Advised to return call for any fluid symptoms or questions.

## 2015-12-19 ENCOUNTER — Encounter: Payer: Self-pay | Admitting: Physician Assistant

## 2015-12-19 ENCOUNTER — Ambulatory Visit (INDEPENDENT_AMBULATORY_CARE_PROVIDER_SITE_OTHER): Payer: Medicare HMO | Admitting: Physician Assistant

## 2015-12-19 VITALS — BP 110/58 | HR 72 | Ht 63.0 in | Wt 258.0 lb

## 2015-12-19 DIAGNOSIS — I87002 Postthrombotic syndrome without complications of left lower extremity: Secondary | ICD-10-CM | POA: Diagnosis not present

## 2015-12-19 DIAGNOSIS — I2510 Atherosclerotic heart disease of native coronary artery without angina pectoris: Secondary | ICD-10-CM | POA: Diagnosis not present

## 2015-12-19 DIAGNOSIS — I10 Essential (primary) hypertension: Secondary | ICD-10-CM | POA: Diagnosis not present

## 2015-12-19 DIAGNOSIS — I519 Heart disease, unspecified: Secondary | ICD-10-CM | POA: Diagnosis not present

## 2015-12-19 DIAGNOSIS — Z95810 Presence of automatic (implantable) cardiac defibrillator: Secondary | ICD-10-CM

## 2015-12-19 NOTE — Progress Notes (Addendum)
Cardiology Office Note:    Date:  12/19/2015   ID:  [name], DOB 17-Mar-1950, MRN [id number]  PCP:  Imelda Pillow, NP  Cardiologist:  Dr. Candee Furbish   Electrophysiologist:  Dr. Thompson Grayer   Referring MD: Everardo Beals, NP   Chief Complaint  Patient presents with  . Follow-up    CHF, CAD    History of Present Illness:    [name] Felt is a 65 y.o. female with a hx of CAD s/p CABG [id number], ICM, systolic HF, VTach, s/p ICD (CRT was unsuccessful due to vein slcerosis), OSA, DM2, HTN. She is followed in the [facility].  Admitted 3/17 with syncope. She was found down by her children. Notes indicate she recalled feeling light-headed. Creatinine was elevated and she reported poor PO intake. She was given IVFs. Echo demonstrated improved LVF with EF 40-45%. BP was soft and medications were held. Notes indicate Lasix was resumed at DC. Coreg and Spironolactone were held at DC. Creatinine 2.05 >> 1.44 at DC. EDP notes from presentation indicate that ICD was interrogated and no abnormal activity was demonstrated. Overall, it was felt her picture was c/w dehydration.   Admitted in 9/17 with extensive bilateral LE DVT.  She underwent thrombolysis with intravascular tPA and stenting to the L common/exernal iliac vein.  She was DC on Lovenox for 6 weeks with plans to transition to [place].    Last ICM transmission 12/16/15.  Thoracic impedance was normal. ICM trend: 12/15/2016    She returns for Cardiology follow up.  She is here with her son, [name].  She denies chest pain, significant shortness of breath, orthopnea, PND.  She notes continued LE edema.  The L leg is worse.  She has compression stockings.  She has a wound over her L ankle that has been oozing.  She had her PCP put a dressing on this 2 weeks ago.  She has not changed it since that time.  She denies fevers or chills.      Prior CV studies that were reviewed today include:    Echo 03/13/15 Mild to mod LVH,  EF 40-45%, no RWMA, Gr 1 DD  Myoview 5/16 Findings consistent with prior myocardial infarction. This is a high risk study. The left ventricular ejection fraction is severely decreased (<30%). Defect 1: There is a large defect of severe severity present in the mid inferoseptal and apex location.  [place] 12/11 EF 30-35% L-D2 patent S-RI patent S-OM1/OM2 patent S-PDA patent  Past Medical History:  Diagnosis Date  . AICD (automatic cardioverter/defibrillator) present   . Angina decubitus ([place]) 11/06/2012  . Angina pectoris ([place])   . CHF (congestive heart failure) ([place])   . Chronic combined systolic and diastolic CHF, NYHA class 3 ([place])   . Coronary artery disease    s/p CABG 2007  . Diabetes mellitus    type II  . DVT (deep venous thrombosis) ([place]) 09/2015   bilateral  . Hypertension   . Ischemic cardiomyopathy    EF 30-35%, s/p ICD 4/08 by Dr Leonia Reeves  . Morbid obesity ([place]) 11/06/2012  . Obesity   . Oxygen dependent 2 L  . 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 Sleep apnea    uses O2 at night  . Ventricular tachycardia ([place]) 01/16/15   sustained VT terminated with ATP, CL 340 msec    Past  Surgical History:  Procedure Laterality Date  . BI-VENTRICULAR IMPLANTABLE CARDIOVERTER DEFIBRILLATOR UPGRADE N/A 09/25/2012   Procedure: BI-VENTRICULAR IMPLANTABLE CARDIOVERTER DEFIBRILLATOR UPGRADE;  Surgeon: Coralyn Mark, MD;  Location: [facility] CATH LAB;  Service: Cardiovascular;  Laterality: N/A;  . CARDIAC CATHETERIZATION    . CARDIAC DEFIBRILLATOR PLACEMENT  4/08   by Dr Leonia Reeves (MDT)  . CORONARY ARTERY BYPASS GRAFT  10/05/2005   by Dr Cyndia Bent  . IMPLANTABLE CARDIOVERTER DEFIBRILLATOR IMPLANT  09/25/12   attempt of upgrade to CRT-D unsuccessful due to CS anatomy, SJM Unify Asaura device placed with LV port capped by Dr Rayann Heman  . IR GENERIC HISTORICAL  10/03/2015   IR US GUIDE VASC ACCESS LEFT  10/03/2015 Jacqulynn Cadet, MD [facility]  . IR GENERIC HISTORICAL  10/03/2015   IR VENO/EXT/UNI LEFT 10/03/2015 Jacqulynn Cadet, MD [facility]  . IR GENERIC HISTORICAL  10/03/2015   IR VENOCAVAGRAM IVC 10/03/2015 Jacqulynn Cadet, MD [facility]  . IR GENERIC HISTORICAL  10/03/2015   IR INFUSION THROMBOL VENOUS INITIAL (MS) 10/03/2015 Jacqulynn Cadet, MD [facility]  . IR GENERIC HISTORICAL  10/03/2015   IR US GUIDE VASC ACCESS LEFT 10/03/2015 Jacqulynn Cadet, MD [facility]  . IR GENERIC HISTORICAL  10/04/2015   IR THROMBECT VENO MECH MOD SED 10/04/2015 Sandi Mariscal, MD [facility]  . IR GENERIC HISTORICAL  10/04/2015   IR TRANSCATH PLC STENT 1ST ART NOT LE CV CAR VERT CAR 10/04/2015 Sandi Mariscal, MD [facility]  . IR GENERIC HISTORICAL  10/04/2015   IR THROMB F/U EVAL ART/VEN FINAL DAY (MS) 10/04/2015 Sandi Mariscal, MD [facility]  . IR GENERIC HISTORICAL  11/02/2015   IR RADIOLOGIST EVAL & MGMT 11/02/2015 Sandi Mariscal, MD [facility]    Current Medications: Current Meds  Medication Sig  . albuterol (PROVENTIL HFA;VENTOLIN HFA) 108 (90 BASE) MCG/ACT inhaler Inhale 2 puffs into the lungs every 6 (six) hours as needed for wheezing or shortness of breath.  Marland Kitchen albuterol (PROVENTIL) (2.5 MG/3ML) 0.083% nebulizer solution Take 2.5 mg by nebulization every 6 (six) hours as needed for wheezing or shortness of breath.   Marland Kitchen aspirin EC 81 MG EC tablet Take 1 tablet (81 mg total) by mouth daily.  . carvedilol (COREG) 12.5 MG tablet Take 12.5 mg by mouth 2 (two) times daily with a meal.   . Choline Fenofibrate (FENOFIBRIC ACID) 135 MG CPDR 135 mg.  . fluconazole (DIFLUCAN) 150 MG tablet Take 150 mg by mouth daily.   . furosemide (LASIX) 80 MG tablet Take 1 tablet (80 mg total) by mouth 2 (two) times daily.  Marland Kitchen glipiZIDE (GLUCOTROL) 5 MG tablet Take 5 mg by mouth daily before breakfast.   . hydrOXYzine (VISTARIL) 50 MG capsule as needed.  . IRON PO Take 1 tablet by mouth daily.  . isosorbide  mononitrate (IMDUR) 30 MG 24 hr tablet Take 30 mg by mouth daily.  Marland Kitchen ketoconazole (NIZORAL) 2 % cream Apply 1 application topically 2 (two) times daily.   Marland Kitchen latanoprost (XALATAN) 0.005 % ophthalmic solution Place 1 drop into both eyes at bedtime.  Marland Kitchen LEVEMIR FLEXTOUCH 100 UNIT/ML Pen   . lisinopril (PRINIVIL,ZESTRIL) 10 MG tablet Take 10 mg by mouth daily.   . methocarbamol (ROBAXIN) 500 MG tablet Take 1 tablet (500 mg total) by mouth every 8 (eight) hours as needed for muscle spasms.  . nitroGLYCERIN (NITROSTAT) 0.4 MG SL tablet Place 0.4 mg under the tongue every 5 (five) minutes as needed for chest pain.  Marland Kitchen NOVOLOG FLEXPEN  100 UNIT/ML FlexPen Inject 30-50 Units into the skin See admin instructions. 50 units every morning, 30 units at lunch and 50 units at suppre  . potassium chloride SA (K-DUR,KLOR-CON) 20 MEQ tablet Take 20 mEq by mouth.  . potassium phosphate, monobasic, (K-PHOS ORIGINAL) 500 MG tablet Take 500 mg by mouth daily as needed (takes when needed for spasms).   . rosuvastatin (CRESTOR) 20 MG tablet Take 1 tablet (20 mg total) by mouth daily at 6 PM.  . spironolactone (ALDACTONE) 25 MG tablet Take 0.5 tablets (12.5 mg total) by mouth daily.  . traMADol (ULTRAM) 50 MG tablet Take 50 mg by mouth 2 (two) times daily.  Alveda Reasons 20 MG TABS tablet Take 20 mg by mouth daily.     Allergies:   Metformin and related; Atorvastatin; and Levofloxacin   Social History   Social History  . Marital status: Legally Separated    Spouse name: N/A  . Number of children: 3  . Years of education: 68   Occupational History  . HAIRDRESSER Unemployed   Social History Main Topics  . Smoking status: Never Smoker  . Smokeless tobacco: Never Used  . Alcohol use No  . Drug use: No  . Sexual activity: Not Currently   Other Topics Concern  . None   Social History Narrative   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   11/04/15 Lives with son    caffeine - coffee, 1-2  cups daily     Family History:  The patient's family history includes Diabetes (age of onset: 74) in her mother; Stroke in her mother.   ROS:   Please see the history of present illness.    Review of Systems  Cardiovascular: Positive for leg swelling.  Musculoskeletal: Positive for back pain, joint pain and joint swelling.  Neurological: Positive for loss of balance.  Psychiatric/Behavioral: Positive for depression.   All other systems reviewed and are negative.   EKGs/Labs/Other Test Reviewed:    EKG:  EKG is  ordered today.  The ekg ordered today demonstrates NSR, HR 72, LBBB  Recent Labs: 03/12/2015: TSH 1.786 03/21/2015: Brain Natriuretic Peptide 73.5 10/03/2015: ALT 16 10/06/2015: Magnesium 2.1 10/07/2015: BUN 17; Creatinine, Ser 1.20; Hemoglobin 10.8; Platelets 197; Potassium 3.7; Sodium 135   Recent Lipid Panel    Component Value Date/Time   CHOL 90 12/22/2012 1042   TRIG 85 12/22/2012 1042   HDL 33 (L) 12/22/2012 1042   CHOLHDL 4.2 04/25/2012 0520   VLDL 23 04/25/2012 0520   LDLCALC 40 12/22/2012 1042     Physical Exam:    VS:  BP (!) 110/58   Pulse 72   Ht 5\' 3"  (1.6 m)   Wt 258 lb (117 kg)   BMI 45.70 kg/m     Wt Readings from Last 3 Encounters:  12/19/15 258 lb (117 kg)  11/02/15 245 lb (111.1 kg)  10/07/15 249 lb 9 oz (113.2 kg)     Physical Exam  Constitutional: She is oriented to person, place, and time. She appears well-developed and well-nourished. No distress.  HENT:  Head: Normocephalic and atraumatic.  Eyes: No scleral icterus.  Neck:  I cannot appreciate JVD at 90 degrees  Cardiovascular: Normal rate, regular rhythm and normal heart sounds.   No murmur heard. Pulmonary/Chest: Effort normal. She has no wheezes. She has no rales.  Abdominal: Soft. There is no tenderness.  Musculoskeletal: She exhibits edema.  1-2+ bilateral LE edema (L > R)  Neurological: She  is alert and oriented to person, place, and time.  Skin:  Compression stocking  on L pulled off.  There are several abrasion like lesions surrounding the ankle. The skin is macerated and fragile. There is a foul odor. New dressing with several 4 x 4 gauze applied loosely.    ASSESSMENT:    1. Chronic systolic dysfunction of left ventricle   2. Post-thrombotic syndrome of left lower extremity   3. Coronary artery disease involving native coronary artery of native heart without angina pectoris   4. Essential hypertension   5. ICD (implantable cardioverter-defibrillator) in place    PLAN:    In order of problems listed above:  1. Chronic systolic CHF - Ischemic CM.  NYHA 2b-3. Volume appears stable. Recent ICM monitoring with normal thoracic impedance. Continue Coreg, Lisinopril, Spiro, nitrates. - EF > 40% so no indication for Entresto. - HR elevated today. No indication for Ivabridine with EF > 35%.  2. Post-thrombotic syndrome - She is s/p thrombolysis with tPA and venous stenting 2/2 extensive bilateral LE DVT in 9/17.  She has significant edema in her L leg with wound formation around her ankle.  This is mainly due to poor hygiene.  She is unable to change the compression stocking herself.  She would benefit from being followed in the [facility].  Hopefully, her legs can be wrapped to better help with her edema. I removed the old dressing today and did put a loose dressing on her L ankle.  I asked her to get a new pair of compression stockings and to launder the current pair she is wearing.  I have asked her to keep her wound clean and dry and keep her legs elevated.  -  Refer to Wound Clinic.  3. CAD - s/p CABG. LHC in 2/011 with patent grafts. Myoview in 2016 with inf-septal and apical scar. Med Rx continued. Continue ASA, beta-blocker, statin.  4. HTN - BP is controlled.    5. S/p ICD - FU with EP as planned.   Total time spent with patient today 45 minutes. This includes reviewing records, evaluating the patient and  coordinating care. Face-to-face time >50%.   Medication Adjustments/Labs and Tests Ordered: Current medicines are reviewed at length with the patient today.  Concerns regarding medicines are outlined above.  Medication changes, Labs and Tests ordered today are outlined in the Patient Instructions noted below. Patient Instructions  Medication Instructions:  No changes.  See your medication list.  Labwork: None today.  Testing/Procedures: None   Follow-Up: Dr. Candee Furbish in 4 months.  Follow up with Primary Care Physician in the next few days for your wound on left leg  Any Other Special Instructions Will Be Listed Below (If Applicable). Try to keep the wound on your left ankle clean and dry. Try to get a new pair of compression stockings and clean the ones you are currently using. I have submitted a referral to the Wound Clinic.  If you need a refill on your cardiac medications before your next appointment, please call your pharmacy.   Signed, [name], PA-C  12/19/2015 3:00 PM    [facility] [place], [place], [place]  [address] Phone: [phone]; Fax: [phone]

## 2015-12-19 NOTE — Patient Instructions (Addendum)
Medication Instructions:  No changes.  See your medication list.  Labwork: None today.  Testing/Procedures: None   Follow-Up: Dr. Candee Furbish in 4 months.  Follow up with Primary Care Physician in the next few days for your wound on left leg  Any Other Special Instructions Will Be Listed Below (If Applicable). Try to keep the wound on your left ankle clean and dry. Try to get a new pair of compression stockings and clean the ones you are currently using. I have submitted a referral to the Wound Clinic.  If you need a refill on your cardiac medications before your next appointment, please call your pharmacy.

## 2015-12-27 ENCOUNTER — Ambulatory Visit (HOSPITAL_BASED_OUTPATIENT_CLINIC_OR_DEPARTMENT_OTHER): Payer: Medicare HMO

## 2015-12-27 ENCOUNTER — Encounter (HOSPITAL_BASED_OUTPATIENT_CLINIC_OR_DEPARTMENT_OTHER): Payer: Medicare HMO | Attending: Surgery

## 2015-12-27 DIAGNOSIS — I255 Ischemic cardiomyopathy: Secondary | ICD-10-CM | POA: Diagnosis not present

## 2015-12-27 DIAGNOSIS — Z7901 Long term (current) use of anticoagulants: Secondary | ICD-10-CM | POA: Diagnosis not present

## 2015-12-27 DIAGNOSIS — E11622 Type 2 diabetes mellitus with other skin ulcer: Secondary | ICD-10-CM | POA: Insufficient documentation

## 2015-12-27 DIAGNOSIS — I110 Hypertensive heart disease with heart failure: Secondary | ICD-10-CM | POA: Diagnosis not present

## 2015-12-27 DIAGNOSIS — Z95820 Peripheral vascular angioplasty status with implants and grafts: Secondary | ICD-10-CM | POA: Insufficient documentation

## 2015-12-27 DIAGNOSIS — L97521 Non-pressure chronic ulcer of other part of left foot limited to breakdown of skin: Secondary | ICD-10-CM | POA: Insufficient documentation

## 2015-12-27 DIAGNOSIS — Z951 Presence of aortocoronary bypass graft: Secondary | ICD-10-CM | POA: Insufficient documentation

## 2015-12-27 DIAGNOSIS — L97322 Non-pressure chronic ulcer of left ankle with fat layer exposed: Secondary | ICD-10-CM | POA: Diagnosis not present

## 2015-12-27 DIAGNOSIS — G4733 Obstructive sleep apnea (adult) (pediatric): Secondary | ICD-10-CM | POA: Diagnosis not present

## 2015-12-27 DIAGNOSIS — I5022 Chronic systolic (congestive) heart failure: Secondary | ICD-10-CM | POA: Diagnosis not present

## 2015-12-27 DIAGNOSIS — Z95810 Presence of automatic (implantable) cardiac defibrillator: Secondary | ICD-10-CM | POA: Diagnosis not present

## 2015-12-27 DIAGNOSIS — Z794 Long term (current) use of insulin: Secondary | ICD-10-CM | POA: Insufficient documentation

## 2015-12-27 DIAGNOSIS — Z7982 Long term (current) use of aspirin: Secondary | ICD-10-CM | POA: Diagnosis not present

## 2015-12-27 DIAGNOSIS — Z79899 Other long term (current) drug therapy: Secondary | ICD-10-CM | POA: Insufficient documentation

## 2015-12-27 DIAGNOSIS — I2510 Atherosclerotic heart disease of native coronary artery without angina pectoris: Secondary | ICD-10-CM | POA: Insufficient documentation

## 2015-12-27 DIAGNOSIS — I87032 Postthrombotic syndrome with ulcer and inflammation of left lower extremity: Secondary | ICD-10-CM | POA: Diagnosis not present

## 2015-12-27 DIAGNOSIS — L97811 Non-pressure chronic ulcer of other part of right lower leg limited to breakdown of skin: Secondary | ICD-10-CM | POA: Insufficient documentation

## 2015-12-28 ENCOUNTER — Telehealth (HOSPITAL_BASED_OUTPATIENT_CLINIC_OR_DEPARTMENT_OTHER): Payer: Self-pay | Admitting: *Deleted

## 2016-01-03 ENCOUNTER — Telehealth: Payer: Self-pay | Admitting: Cardiology

## 2016-01-03 DIAGNOSIS — E11622 Type 2 diabetes mellitus with other skin ulcer: Secondary | ICD-10-CM | POA: Diagnosis not present

## 2016-01-03 NOTE — Telephone Encounter (Signed)
Spoke w/ pt and requested that she send a manual transmission b/c her home monitor has not updated in at least 7 days.   

## 2016-01-06 ENCOUNTER — Encounter (HOSPITAL_BASED_OUTPATIENT_CLINIC_OR_DEPARTMENT_OTHER): Payer: Medicare HMO

## 2016-01-10 ENCOUNTER — Encounter (HOSPITAL_BASED_OUTPATIENT_CLINIC_OR_DEPARTMENT_OTHER): Payer: Medicare HMO | Attending: Surgery

## 2016-01-10 DIAGNOSIS — E11621 Type 2 diabetes mellitus with foot ulcer: Secondary | ICD-10-CM | POA: Insufficient documentation

## 2016-01-10 DIAGNOSIS — Z951 Presence of aortocoronary bypass graft: Secondary | ICD-10-CM | POA: Diagnosis not present

## 2016-01-10 DIAGNOSIS — L97821 Non-pressure chronic ulcer of other part of left lower leg limited to breakdown of skin: Secondary | ICD-10-CM | POA: Diagnosis not present

## 2016-01-10 DIAGNOSIS — Z95820 Peripheral vascular angioplasty status with implants and grafts: Secondary | ICD-10-CM | POA: Insufficient documentation

## 2016-01-10 DIAGNOSIS — L97221 Non-pressure chronic ulcer of left calf limited to breakdown of skin: Secondary | ICD-10-CM | POA: Diagnosis not present

## 2016-01-10 DIAGNOSIS — I10 Essential (primary) hypertension: Secondary | ICD-10-CM | POA: Diagnosis not present

## 2016-01-10 DIAGNOSIS — L97521 Non-pressure chronic ulcer of other part of left foot limited to breakdown of skin: Secondary | ICD-10-CM | POA: Insufficient documentation

## 2016-01-10 DIAGNOSIS — G4733 Obstructive sleep apnea (adult) (pediatric): Secondary | ICD-10-CM | POA: Diagnosis not present

## 2016-01-10 DIAGNOSIS — Z95810 Presence of automatic (implantable) cardiac defibrillator: Secondary | ICD-10-CM | POA: Insufficient documentation

## 2016-01-10 DIAGNOSIS — I2510 Atherosclerotic heart disease of native coronary artery without angina pectoris: Secondary | ICD-10-CM | POA: Insufficient documentation

## 2016-01-10 DIAGNOSIS — Z86718 Personal history of other venous thrombosis and embolism: Secondary | ICD-10-CM | POA: Insufficient documentation

## 2016-01-10 DIAGNOSIS — E11622 Type 2 diabetes mellitus with other skin ulcer: Secondary | ICD-10-CM | POA: Diagnosis present

## 2016-01-16 ENCOUNTER — Ambulatory Visit (INDEPENDENT_AMBULATORY_CARE_PROVIDER_SITE_OTHER): Payer: Medicare HMO

## 2016-01-16 ENCOUNTER — Telehealth: Payer: Self-pay | Admitting: Cardiology

## 2016-01-16 DIAGNOSIS — Z95810 Presence of automatic (implantable) cardiac defibrillator: Secondary | ICD-10-CM

## 2016-01-16 DIAGNOSIS — I5042 Chronic combined systolic (congestive) and diastolic (congestive) heart failure: Secondary | ICD-10-CM

## 2016-01-16 DIAGNOSIS — I5022 Chronic systolic (congestive) heart failure: Secondary | ICD-10-CM

## 2016-01-16 NOTE — Progress Notes (Signed)
EPIC Encounter for ICM Monitoring  Patient Name: [name] is a 66 y.o. female Date: 01/16/2016 Primary Care Physican: Imelda Pillow, NP Primary Cardiologist:Skains/Weaver PA Electrophysiologist: Allred Dry Weight:unknown       Heart Failure questions reviewed, pt symptomatic with swollen legs.  She has not been following low salt diet and eating foods such as cheetos.  She has cut back on fluid pill so she does not have to be on her feet as much because her legs hurt and it is difficult to walk.  In the last week, some days she has been taking Furosemide 80 mg 1 tablet and other days she has been taking 1/2 tablet (40 mg total) a day.         Thoracic impedance abnormal suggesting fluid accumulation since 12/18/2015 to 01/01/16, 01/04/16 to 01/07/16, and 01/15/16 to today, starting to trend back toward baseline.   Reference line and impedance have been progressively trending down since 11/15/2015.    Labs: 10/07/2015 Creatinine 1.20, BUN 17, Potassium 3.7, Sodium 135 10/06/2015 Creatinine 1.08, BUN 16, Potassium 3.9, Sodium 135 10/04/2015 Creatinine 0.91, BUN 12, Potassium 4.0, Sodium 137 10/03/2015 Creatinine 1.16, BUN 15, Potassium 4.4, Sodium 133 10/02/2015 Creatinine 1.27, BUN 15, Potassium 3.9, Sodium 137 10/01/2015 Creatinine 1.07, BUN 13, Potassium 3.5, Sodium 137 09/30/2015 Creatinine 0.98, BUN 13, Potassium 4.0, Sodium 138 09/20/2015 Creatinine 0.88, BUN 13, Potassium 3.9, Sodium 136 05/19/2015 Creatinine 1.11, BUN 16, Potassium 4.1, Sodium 138  Recommendations:  Discussed food choices and explained to limit salt intake to 2000 mg daily and to read food labels to help identify foods high in salt.      Follow-up plan: ICM clinic phone appointment on 01/24/2016 to recheck fluid levels.  Copy of ICM check sent to [name], [place], Dr Marlou Porch and Dr Rayann Heman for review and recommendations.    3 month ICM trend : 01/16/2016   1 Year ICM trend:      [name],  RN 01/16/2016 3:38 PM

## 2016-01-16 NOTE — Telephone Encounter (Signed)
LMOVM reminding pt to send remote transmission.   

## 2016-01-16 NOTE — Progress Notes (Signed)
Her chart lists her Lasix at 80 mg bid.  It looks like this was the recommended dose at DC from the hospital in 9/17 and at her last visit with me. I would recommend that she resume Lasix 80 mg bid. FU with ICM clinic as planned. Get follow up BMET 1 week. [name], PA-C   01/16/2016 4:10 PM

## 2016-01-16 NOTE — Progress Notes (Signed)
Call to patient.  Explained [name], [place] advised she should be on her prescribed dosage of Furosemide which is 80 mg 1 tablet bid and have a BMET drawn in a week.  She stated she would resume the dosage as prescribed and will have labs drawn on 01/24/2016.

## 2016-01-17 DIAGNOSIS — E11622 Type 2 diabetes mellitus with other skin ulcer: Secondary | ICD-10-CM | POA: Diagnosis not present

## 2016-01-20 ENCOUNTER — Ambulatory Visit: Payer: Medicare HMO | Admitting: Diagnostic Neuroimaging

## 2016-01-24 ENCOUNTER — Other Ambulatory Visit: Payer: Medicare HMO | Admitting: *Deleted

## 2016-01-24 ENCOUNTER — Telehealth: Payer: Self-pay | Admitting: *Deleted

## 2016-01-24 ENCOUNTER — Ambulatory Visit (INDEPENDENT_AMBULATORY_CARE_PROVIDER_SITE_OTHER): Payer: Medicare HMO

## 2016-01-24 ENCOUNTER — Telehealth: Payer: Self-pay | Admitting: Cardiology

## 2016-01-24 DIAGNOSIS — Z95810 Presence of automatic (implantable) cardiac defibrillator: Secondary | ICD-10-CM

## 2016-01-24 DIAGNOSIS — I5022 Chronic systolic (congestive) heart failure: Secondary | ICD-10-CM

## 2016-01-24 DIAGNOSIS — E11622 Type 2 diabetes mellitus with other skin ulcer: Secondary | ICD-10-CM | POA: Diagnosis not present

## 2016-01-24 NOTE — Progress Notes (Signed)
Great! Thanks! [name], PA-C   01/24/2016 5:15 PM

## 2016-01-24 NOTE — Progress Notes (Signed)
EPIC Encounter for ICM Monitoring  Patient Name: [name] is a 66 y.o. female Date: 01/24/2016 Primary Care Physican: Imelda Pillow, NP Primary Cardiologist:Skains/Weaver PA Electrophysiologist: Allred Dry Weight:unknown   Heart Failure questions reviewed, pt reported leg swelling has improved and the ulcers on her feet are improving as well.    Thoracic impedance returned to normal after patient resumed her normal prescribed Furosemide dosage of 80 mg bid and confirmed she is continuing that dose.  Labs:  BMET drawn today 01/24/2016  10/07/2015 Creatinine 1.20, BUN 17, Potassium 3.7, Sodium 135 10/06/2015 Creatinine 1.08, BUN 16, Potassium 3.9, Sodium 135 10/04/2015 Creatinine 0.91, BUN 12, Potassium 4.0, Sodium 137 10/03/2015 Creatinine 1.16, BUN 15, Potassium 4.4, Sodium 133 10/02/2015 Creatinine 1.27, BUN 15, Potassium 3.9, Sodium 137 10/01/2015 Creatinine 1.07, BUN 13, Potassium 3.5, Sodium 137 09/30/2015 Creatinine 0.98, BUN 13, Potassium 4.0, Sodium 138 09/20/2015 Creatinine 0.88, BUN 13, Potassium 3.9, Sodium 136 05/19/2015 Creatinine 1.11, BUN 16, Potassium 4.1, Sodium 138  Recommendations:    Confirmed she had labs drawn today.  No changes. Discussed limiting dietary salt intake to 2000 mg/day and fluid intake to < 2 liters per day. Encouraged to call for fluid symptoms.  Follow-up plan: ICM clinic phone appointment on 02/16/2016.  Copy of ICM check sent to PA, primary cardiologist and device physician.   3 month ICM trend: 01/24/2016   1 Year ICM trend:      [name], RN 01/24/2016 2:02 PM

## 2016-01-24 NOTE — Telephone Encounter (Signed)
Spoke with patient and rescheduled her follow up tomorrow due to impending bad weather. Explained to her our office will not open until noon. Advised she will get a reminder call prior to her follow up. She verbalized understanding.

## 2016-01-24 NOTE — Telephone Encounter (Signed)
LMOVM reminding pt to send remote transmission.   

## 2016-01-25 ENCOUNTER — Ambulatory Visit: Payer: Self-pay | Admitting: Diagnostic Neuroimaging

## 2016-01-25 LAB — BASIC METABOLIC PANEL
BUN/Creatinine Ratio: 11 — ABNORMAL LOW (ref 12–28)
BUN: 15 mg/dL (ref 8–27)
CO2: 28 mmol/L (ref 18–29)
Calcium: 10 mg/dL (ref 8.7–10.3)
Chloride: 96 mmol/L (ref 96–106)
Creatinine, Ser: 1.35 mg/dL — ABNORMAL HIGH (ref 0.57–1.00)
GFR calc Af Amer: 48 mL/min/{1.73_m2} — ABNORMAL LOW (ref >59)
GFR calc non Af Amer: 41 mL/min/{1.73_m2} — ABNORMAL LOW (ref >59)
GLUCOSE: 153 mg/dL — AB (ref 65–99)
POTASSIUM: 4.3 mmol/L (ref 3.5–5.2)
SODIUM: 140 mmol/L (ref 134–144)

## 2016-01-27 ENCOUNTER — Other Ambulatory Visit: Payer: Self-pay | Admitting: *Deleted

## 2016-01-27 DIAGNOSIS — R748 Abnormal levels of other serum enzymes: Secondary | ICD-10-CM

## 2016-01-31 ENCOUNTER — Other Ambulatory Visit: Payer: Medicare HMO | Admitting: *Deleted

## 2016-01-31 DIAGNOSIS — E11622 Type 2 diabetes mellitus with other skin ulcer: Secondary | ICD-10-CM | POA: Diagnosis not present

## 2016-01-31 DIAGNOSIS — R748 Abnormal levels of other serum enzymes: Secondary | ICD-10-CM

## 2016-02-01 LAB — BASIC METABOLIC PANEL
BUN/Creatinine Ratio: 16 (ref 12–28)
BUN: 17 mg/dL (ref 8–27)
CHLORIDE: 101 mmol/L (ref 96–106)
CO2: 23 mmol/L (ref 18–29)
CREATININE: 1.09 mg/dL — AB (ref 0.57–1.00)
Calcium: 9.8 mg/dL (ref 8.7–10.3)
GFR calc Af Amer: 62 mL/min/{1.73_m2} (ref >59)
GFR calc non Af Amer: 53 mL/min/{1.73_m2} — ABNORMAL LOW (ref >59)
GLUCOSE: 187 mg/dL — AB (ref 65–99)
Potassium: 4.7 mmol/L (ref 3.5–5.2)
SODIUM: 138 mmol/L (ref 134–144)

## 2016-02-02 ENCOUNTER — Telehealth: Payer: Self-pay | Admitting: *Deleted

## 2016-02-02 NOTE — Telephone Encounter (Signed)
Pt notified of lab results by phone with verbal understanding.  

## 2016-02-02 NOTE — Telephone Encounter (Signed)
DPR ok to lmom. Lab work ok, kidney function stable/improved. Continue on current Tx plan. If any questions feel free to call [phone].

## 2016-02-02 NOTE — Telephone Encounter (Signed)
F/u message ° °Pt returning RN call. Please call back to discuss  °

## 2016-02-07 ENCOUNTER — Telehealth: Payer: Self-pay | Admitting: Cardiology

## 2016-02-07 NOTE — Telephone Encounter (Signed)
Spoke w/ pt and requested that she send a manual transmission b/c her home monitor has not updated in at least 7 days.   

## 2016-02-13 ENCOUNTER — Other Ambulatory Visit: Payer: Self-pay | Admitting: *Deleted

## 2016-02-13 DIAGNOSIS — R921 Mammographic calcification found on diagnostic imaging of breast: Secondary | ICD-10-CM

## 2016-02-16 ENCOUNTER — Telehealth: Payer: Self-pay | Admitting: Cardiology

## 2016-02-16 ENCOUNTER — Ambulatory Visit (INDEPENDENT_AMBULATORY_CARE_PROVIDER_SITE_OTHER): Payer: Medicare HMO

## 2016-02-16 DIAGNOSIS — I5022 Chronic systolic (congestive) heart failure: Secondary | ICD-10-CM | POA: Diagnosis not present

## 2016-02-16 DIAGNOSIS — Z95810 Presence of automatic (implantable) cardiac defibrillator: Secondary | ICD-10-CM

## 2016-02-16 NOTE — Progress Notes (Signed)
EPIC Encounter for ICM Monitoring  Patient Name: [name] is a 66 y.o. female Date: 02/16/2016 Primary Care Physican: Imelda Pillow, NP Primary Cardiologist:Skains/Weaver PA Electrophysiologist: Allred Dry Weight:unknown       Heart Failure questions reviewed, pt asymptomatic.  She stated her legs look good today and very little swelling.   Thoracic impedance improving and normal. Reference line improved since last ICM check on 01/24/2016.  Labs:  BMET drawn today 01/24/2016  01/31/2016 Creatinine 1.09, BUN 17, Potassium 4.7, Sodium 138, EGFR 53-62 01/24/2016 Creatinine 1.35, BUN 15, Potassium 4.3, Sodium 140, EGFR 41-48 10/07/2015 Creatinine 1.20, BUN 17, Potassium 3.7, Sodium 135 10/06/2015 Creatinine 1.08, BUN 16, Potassium 3.9, Sodium 135 10/04/2015 Creatinine 0.91, BUN 12, Potassium 4.0, Sodium 137 10/03/2015 Creatinine 1.16, BUN 15, Potassium 4.4, Sodium 133 10/02/2015 Creatinine 1.27, BUN 15, Potassium 3.9, Sodium 137 10/01/2015 Creatinine 1.07, BUN 13, Potassium 3.5, Sodium 137 09/30/2015 Creatinine 0.98, BUN 13, Potassium 4.0, Sodium 138 09/20/2015 Creatinine 0.88, BUN 13, Potassium 3.9, Sodium 136 05/19/2015 Creatinine 1.11, BUN 16, Potassium 4.1, Sodium 138  Recommendations: No changes. Reminded to limit dietary salt intake to 2000 mg/day and fluid intake to < 2 liters/day. Encouraged to call for fluid symptoms.  Follow-up plan: ICM clinic phone appointment on 03/22/2016.  Copy of ICM check sent to primary cardiologist and device physician.   3 month ICM trend: 02/16/2016   1 Year ICM trend:      [name], RN 02/16/2016 5:40 PM

## 2016-02-16 NOTE — Telephone Encounter (Signed)
Spoke with pt and reminded pt of remote transmission that is due today. Pt verbalized understanding.   

## 2016-02-19 NOTE — Progress Notes (Signed)
Great!.   Continue with current treatment plan. [name], PA-C   02/19/2016 10:07 PM

## 2016-03-22 ENCOUNTER — Ambulatory Visit (INDEPENDENT_AMBULATORY_CARE_PROVIDER_SITE_OTHER): Payer: Medicare HMO

## 2016-03-22 ENCOUNTER — Telehealth: Payer: Self-pay | Admitting: Cardiology

## 2016-03-22 DIAGNOSIS — Z95810 Presence of automatic (implantable) cardiac defibrillator: Secondary | ICD-10-CM | POA: Diagnosis not present

## 2016-03-22 DIAGNOSIS — I5022 Chronic systolic (congestive) heart failure: Secondary | ICD-10-CM | POA: Diagnosis not present

## 2016-03-22 NOTE — Telephone Encounter (Signed)
Spoke with pt and reminded pt of remote transmission that is due today. Pt verbalized understanding.   

## 2016-03-23 NOTE — Progress Notes (Signed)
EPIC Encounter for ICM Monitoring  Patient Name: [name] is a 66 y.o. female Date: 03/23/2016 Primary Care Physican: Imelda Pillow, NP Primary Cardiologist:Skains/Weaver PA Electrophysiologist: Allred Dry Weight:unknown      Heart Failure questions reviewed, pt reported legs are chronically swollen but no worse than a month ago.  She denied any breathing difficulties.    Thoracic impedance abnormal suggesting fluid accumulation x 17 days since 03/08/2016 but is starting to trend back toward baseline 3/15.  Prescribed and confirmed dosage: Furosemide 80 mg 1 tablet twice a day.  Potassium 20 mEq 1 tablet daily.   She has missed total of 3 dosages in the last 2 weeks.  Labs:  01/31/2016 Creatinine 1.09, BUN 17, Potassium 4.7, Sodium 138, EGFR 53-62 01/24/2016 Creatinine 1.35, BUN 15, Potassium 4.3, Sodium 140, EGFR 41-48 10/07/2015 Creatinine 1.20, BUN 17, Potassium 3.7, Sodium 135 10/06/2015 Creatinine 1.08, BUN 16, Potassium 3.9, Sodium 135 10/04/2015 Creatinine 0.91, BUN 12, Potassium 4.0, Sodium 137 10/03/2015 Creatinine 1.16, BUN 15, Potassium 4.4, Sodium 133 10/02/2015 Creatinine 1.27, BUN 15, Potassium 3.9, Sodium 137 10/01/2015 Creatinine 1.07, BUN 13, Potassium 3.5, Sodium 137 09/30/2015 Creatinine 0.98, BUN 13, Potassium 4.0, Sodium 138 09/20/2015 Creatinine 0.88, BUN 13, Potassium 3.9, Sodium 136 05/19/2015 Creatinine 1.11, BUN 16, Potassium 4.1, Sodium 138  Recommendations: Advised would send copy of ICM to [name], PA, Dr Marlou Porch and Dr Rayann Heman for review and if any recommendations will call her back.   Follow-up plan: ICM clinic phone appointment on 04/10/2016 to recheck fluid levels.   3 month ICM trend: 03/22/2016   1 Year ICM trend:      [name], RN 03/23/2016 1:05 PM

## 2016-03-25 NOTE — Progress Notes (Signed)
Increase Lasix to 120 bid x 1 day. Increase K+ to 20 bid x 1 day Then resume usual dose of Lasix and K+ Recheck ICM in 1 week. [name], PA-C   03/25/2016 9:11 PM

## 2016-03-26 NOTE — Progress Notes (Addendum)
Call to patient.  Advised [name], [place] recommeneded to increase Lasix to 120 bid x 1 day. Increase K+ to 20 bid x 1 day and then resume usual dose of Lasix and K+.  ICM recheck 04/03/2016.  She verbalized understanding

## 2016-03-28 ENCOUNTER — Ambulatory Visit: Payer: Self-pay | Admitting: Diagnostic Neuroimaging

## 2016-03-29 ENCOUNTER — Encounter: Payer: Self-pay | Admitting: Diagnostic Neuroimaging

## 2016-04-02 ENCOUNTER — Ambulatory Visit
Admission: RE | Admit: 2016-04-02 | Discharge: 2016-04-02 | Disposition: A | Discharge status: Home / Self Care | Payer: Medicare HMO | Source: Ambulatory Visit | Attending: *Deleted | Admitting: *Deleted

## 2016-04-02 DIAGNOSIS — R921 Mammographic calcification found on diagnostic imaging of breast: Secondary | ICD-10-CM

## 2016-04-03 ENCOUNTER — Encounter (HOSPITAL_BASED_OUTPATIENT_CLINIC_OR_DEPARTMENT_OTHER): Payer: Medicare HMO

## 2016-04-03 ENCOUNTER — Telehealth: Payer: Self-pay | Admitting: Cardiology

## 2016-04-03 ENCOUNTER — Ambulatory Visit (INDEPENDENT_AMBULATORY_CARE_PROVIDER_SITE_OTHER): Payer: Medicare HMO

## 2016-04-03 DIAGNOSIS — Z95810 Presence of automatic (implantable) cardiac defibrillator: Secondary | ICD-10-CM

## 2016-04-03 DIAGNOSIS — I5022 Chronic systolic (congestive) heart failure: Secondary | ICD-10-CM

## 2016-04-03 NOTE — Telephone Encounter (Signed)
Spoke with pt and reminded pt of remote transmission that is due today. Pt verbalized understanding.   

## 2016-04-03 NOTE — Progress Notes (Signed)
EPIC Encounter for ICM Monitoring  Patient Name: [name] is a 66 y.o. female Date: 04/03/2016 Primary Care Physican: Imelda Pillow, NP Primary Cardiologist:Skains/Weaver PA Electrophysiologist: Allred Dry Weight:unknown            Heart Failure questions reviewed, pt continues to have chronic leg swelling.   Thoracic impedance returned to normal after extra dose of Furosemide as ordered.   Prescribed and confirmed dosage: Furosemide 80 mg 1 tablet twice a day.  Potassium 20 mEq 1 tablet daily.     Labs:  01/31/2016 Creatinine 1.09, BUN 17, Potassium 4.7, Sodium 138, EGFR 53-62 01/24/2016 Creatinine 1.35, BUN 15, Potassium 4.3, Sodium 140, EGFR 41-48 10/07/2015 Creatinine 1.20, BUN 17, Potassium 3.7, Sodium 135 10/06/2015 Creatinine 1.08, BUN 16, Potassium 3.9, Sodium 135 10/04/2015 Creatinine 0.91, BUN 12, Potassium 4.0, Sodium 137 10/03/2015 Creatinine 1.16, BUN 15, Potassium 4.4, Sodium 133 10/02/2015 Creatinine 1.27, BUN 15, Potassium 3.9, Sodium 137 10/01/2015 Creatinine 1.07, BUN 13, Potassium 3.5, Sodium 137 09/30/2015 Creatinine 0.98, BUN 13, Potassium 4.0, Sodium 138 09/20/2015 Creatinine 0.88, BUN 13, Potassium 3.9, Sodium 136 05/19/2015 Creatinine 1.11, BUN 16, Potassium 4.1, Sodium 138  Recommendations: No changes. Reminded to limit dietary salt intake to 2000 mg/day and fluid intake to < 2 liters/day. Encouraged to call for fluid symptoms.  Follow-up plan: ICM clinic phone appointment on 04/30/2016.  Copy of ICM check sent to primary cardiologist and device physician.   3 month ICM trend: 04/03/2016   1 Year ICM trend:      [name], RN 04/03/2016 4:03 PM

## 2016-04-03 NOTE — Progress Notes (Signed)
Agree. [name], PA-C   04/03/2016 5:34 PM

## 2016-04-11 ENCOUNTER — Other Ambulatory Visit (HOSPITAL_COMMUNITY): Payer: Self-pay | Admitting: Interventional Radiology

## 2016-04-11 DIAGNOSIS — Z86718 Personal history of other venous thrombosis and embolism: Secondary | ICD-10-CM

## 2016-04-17 ENCOUNTER — Encounter (HOSPITAL_BASED_OUTPATIENT_CLINIC_OR_DEPARTMENT_OTHER): Payer: Medicare HMO | Attending: Surgery

## 2016-04-17 DIAGNOSIS — Z7982 Long term (current) use of aspirin: Secondary | ICD-10-CM | POA: Diagnosis not present

## 2016-04-17 DIAGNOSIS — I2510 Atherosclerotic heart disease of native coronary artery without angina pectoris: Secondary | ICD-10-CM | POA: Insufficient documentation

## 2016-04-17 DIAGNOSIS — Z951 Presence of aortocoronary bypass graft: Secondary | ICD-10-CM | POA: Insufficient documentation

## 2016-04-17 DIAGNOSIS — I110 Hypertensive heart disease with heart failure: Secondary | ICD-10-CM | POA: Diagnosis not present

## 2016-04-17 DIAGNOSIS — E119 Type 2 diabetes mellitus without complications: Secondary | ICD-10-CM | POA: Diagnosis not present

## 2016-04-17 DIAGNOSIS — I890 Lymphedema, not elsewhere classified: Secondary | ICD-10-CM | POA: Insufficient documentation

## 2016-04-17 DIAGNOSIS — Z7901 Long term (current) use of anticoagulants: Secondary | ICD-10-CM | POA: Diagnosis not present

## 2016-04-17 DIAGNOSIS — Z86718 Personal history of other venous thrombosis and embolism: Secondary | ICD-10-CM | POA: Insufficient documentation

## 2016-04-17 DIAGNOSIS — Z79899 Other long term (current) drug therapy: Secondary | ICD-10-CM | POA: Insufficient documentation

## 2016-04-17 DIAGNOSIS — Z95820 Peripheral vascular angioplasty status with implants and grafts: Secondary | ICD-10-CM | POA: Diagnosis not present

## 2016-04-17 DIAGNOSIS — I5022 Chronic systolic (congestive) heart failure: Secondary | ICD-10-CM | POA: Diagnosis not present

## 2016-04-17 DIAGNOSIS — Z794 Long term (current) use of insulin: Secondary | ICD-10-CM | POA: Insufficient documentation

## 2016-04-18 ENCOUNTER — Telehealth: Payer: Self-pay | Admitting: Cardiology

## 2016-04-18 NOTE — Telephone Encounter (Signed)
Spoke w/ pt and requested that she send a manual transmission b/c her home monitor has not updated in at least 7 days.   

## 2016-04-23 ENCOUNTER — Other Ambulatory Visit: Payer: Self-pay | Admitting: *Deleted

## 2016-04-23 DIAGNOSIS — M8580 Other specified disorders of bone density and structure, unspecified site: Secondary | ICD-10-CM

## 2016-04-23 DIAGNOSIS — R5381 Other malaise: Secondary | ICD-10-CM

## 2016-04-26 ENCOUNTER — Ambulatory Visit
Admission: RE | Admit: 2016-04-26 | Discharge: 2016-04-26 | Disposition: A | Discharge status: Home / Self Care | Payer: Medicare HMO | Source: Ambulatory Visit | Attending: Interventional Radiology | Admitting: Interventional Radiology

## 2016-04-26 DIAGNOSIS — Z86718 Personal history of other venous thrombosis and embolism: Secondary | ICD-10-CM

## 2016-04-26 HISTORY — PX: IR RADIOLOGIST EVAL & MGMT: IMG5224

## 2016-04-26 NOTE — Progress Notes (Signed)
Patient ID: [name], female   DOB: [birth year], 66 y.o.   MRN: [id number]        Chief Complaint: Lower extremity DVT  Referring Physician(s): [place]  History of Present Illness: [name] is a 66 y.o. female with past medical history significant for CAD, CHF, obesity, option dependence, diabetes and hypertension who underwent a technically successful left lower extremity catheter direct thrombolysis and venous stent placement, completed on 10/04/2015. 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 As such, patient has been maintained on anticoagulation (Xarelto) since November of last year.  Patient returns to the interventional radiology clinic today in her wheelchair accompanied by her daughter.  Patient states that she is tolerating the anticoagulation without incident. No excessive bleeding or bruising. No hematuria or bloody or melanotic stools.  Patient reports no significant change in her chronic bilateral lower extremity pain and edema. No chest pain or shortness of breath.  Past Medical History:  Diagnosis Date  . AICD (automatic cardioverter/defibrillator) present   . Angina decubitus ([place]) 11/06/2012  . Angina pectoris ([place])   . CHF (congestive heart failure) ([place])   . Chronic combined systolic and diastolic CHF, NYHA class 3 ([place])   . Coronary artery disease    s/p CABG 2007  . Diabetes mellitus    type II  . DVT (deep venous thrombosis) ([place]) 09/2015   bilateral  . Hypertension   . Ischemic cardiomyopathy    EF 30-35%, s/p ICD 4/08 by Dr Leonia Reeves  . Morbid obesity ([place]) 11/06/2012  . Obesity   . Oxygen dependent 2 L  . 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   LIMA to D2, SVG to ramus intermediate, sequential SVG to OM1-OM2, SVG to RCA with EVH via both  legs   . Sleep apnea    uses O2 at night  . Ventricular tachycardia ([place]) 01/16/15   sustained VT terminated with ATP, CL 340 msec    Past Surgical History:  Procedure Laterality Date  . BI-VENTRICULAR IMPLANTABLE CARDIOVERTER DEFIBRILLATOR UPGRADE N/A 09/25/2012   Procedure: BI-VENTRICULAR IMPLANTABLE CARDIOVERTER DEFIBRILLATOR UPGRADE;  Surgeon: Coralyn Mark, MD;  Location: [facility] CATH LAB;  Service: Cardiovascular;  Laterality: N/A;  . BREAST BIOPSY Right 04/05/2014  . CARDIAC CATHETERIZATION    . CARDIAC DEFIBRILLATOR PLACEMENT  4/08   by Dr Leonia Reeves ([facility])  . CORONARY ARTERY BYPASS GRAFT  10/05/2005   by Dr Cyndia Bent  . IMPLANTABLE CARDIOVERTER DEFIBRILLATOR IMPLANT  09/25/12   attempt of upgrade to CRT-D unsuccessful due to CS anatomy, SJM Unify [place] device placed with LV port capped by Dr Rayann Heman  . IR GENERIC HISTORICAL  10/03/2015   IR US GUIDE VASC ACCESS LEFT 10/03/2015 Jacqulynn Cadet, MD [facility]  . IR GENERIC HISTORICAL  10/03/2015   IR VENO/EXT/UNI LEFT 10/03/2015 Jacqulynn Cadet, MD [facility]  . IR GENERIC HISTORICAL  10/03/2015   IR VENOCAVAGRAM IVC 10/03/2015 Jacqulynn Cadet, MD [facility]  . IR GENERIC HISTORICAL  10/03/2015   IR INFUSION THROMBOL VENOUS INITIAL (MS) 10/03/2015 Jacqulynn Cadet, MD [facility]  . IR GENERIC HISTORICAL  10/03/2015   IR US GUIDE VASC ACCESS LEFT 10/03/2015 Jacqulynn Cadet, MD [facility]  . IR GENERIC HISTORICAL  10/04/2015   IR THROMBECT VENO MECH MOD SED 10/04/2015 Sandi Mariscal, MD [facility]  . IR GENERIC HISTORICAL  10/04/2015   IR  TRANSCATH PLC STENT 1ST ART NOT LE CV CAR VERT CAR 10/04/2015 Sandi Mariscal, MD [facility]  . IR GENERIC HISTORICAL  10/04/2015   IR THROMB F/U EVAL ART/VEN FINAL DAY (MS) 10/04/2015 Sandi Mariscal, MD [facility]  . IR GENERIC HISTORICAL  11/02/2015   IR RADIOLOGIST EVAL & MGMT 11/02/2015 Sandi Mariscal, MD [facility]    Allergies: Metformin and related; Atorvastatin; and  Levofloxacin  Medications: Prior to Admission medications   Medication Sig Start Date End Date Taking? Authorizing Provider  albuterol (PROVENTIL HFA;VENTOLIN HFA) 108 (90 BASE) MCG/ACT inhaler Inhale 2 puffs into the lungs every 6 (six) hours as needed for wheezing or shortness of breath.   Yes Historical Provider, MD  albuterol (PROVENTIL) (2.5 MG/3ML) 0.083% nebulizer solution Take 2.5 mg by nebulization every 6 (six) hours as needed for wheezing or shortness of breath.  09/30/15  Yes Historical Provider, MD  carvedilol (COREG) 12.5 MG tablet Take 12.5 mg by mouth 2 (two) times daily with a meal.  03/30/15  Yes Historical Provider, MD  fluconazole (DIFLUCAN) 150 MG tablet Take 150 mg by mouth daily.  05/15/15  Yes Historical Provider, MD  furosemide (LASIX) 80 MG tablet Take 1 tablet (80 mg total) by mouth 2 (two) times daily. 08/19/15  Yes Jerline Pain, MD  glipiZIDE (GLUCOTROL) 5 MG tablet Take 5 mg by mouth daily before breakfast.    Yes Historical Provider, MD  hydrOXYzine (VISTARIL) 50 MG capsule as needed. 10/27/15  Yes Historical Provider, MD  IRON PO Take 1 tablet by mouth daily.   Yes Historical Provider, MD  isosorbide mononitrate (IMDUR) 30 MG 24 hr tablet Take 30 mg by mouth daily.   Yes Historical Provider, MD  latanoprost (XALATAN) 0.005 % ophthalmic solution Place 1 drop into both eyes at bedtime.   Yes Historical Provider, MD  LEVEMIR FLEXTOUCH 100 UNIT/ML Pen  10/19/15  Yes Historical Provider, MD  lisinopril (PRINIVIL,ZESTRIL) 10 MG tablet Take 10 mg by mouth daily.    Yes Historical Provider, MD  methocarbamol (ROBAXIN) 500 MG tablet Take 1 tablet (500 mg total) by mouth every 8 (eight) hours as needed for muscle spasms. 05/30/14  Yes Barton Dubois, MD  nitroGLYCERIN (NITROSTAT) 0.4 MG SL tablet Place 0.4 mg under the tongue every 5 (five) minutes as needed for chest pain.   Yes Historical Provider, MD  NOVOLOG FLEXPEN 100 UNIT/ML FlexPen Inject 30-50 Units into the skin See admin  instructions. 50 units every morning, 30 units at lunch and 50 units at suppre 09/12/15  Yes Historical Provider, MD  potassium chloride SA (K-DUR,KLOR-CON) 20 MEQ tablet Take 20 mEq by mouth. 09/12/15  Yes Historical Provider, MD  potassium phosphate, monobasic, (K-PHOS ORIGINAL) 500 MG tablet Take 500 mg by mouth daily as needed (takes when needed for spasms).    Yes Historical Provider, MD  rosuvastatin (CRESTOR) 20 MG tablet Take 1 tablet (20 mg total) by mouth daily at 6 PM. 10/07/15  Yes Minus Liberty, MD  spironolactone (ALDACTONE) 25 MG tablet Take 0.5 tablets (12.5 mg total) by mouth daily. 03/28/15  Yes Scott T Kathlen Mody, PA-C  traMADol (ULTRAM) 50 MG tablet Take 50 mg by mouth 2 (two) times daily. 11/28/15  Yes Historical Provider, MD  XARELTO 20 MG TABS tablet Take 20 mg by mouth daily. 12/14/15  Yes Historical Provider, MD  aspirin EC 81 MG EC tablet Take 1 tablet (81 mg total) by mouth daily. Patient not taking: Reported on 04/26/2016 09/11/12   Thana Farr  Marlou Porch, MD  Choline Fenofibrate (FENOFIBRIC ACID) 135 MG CPDR 135 mg. 09/12/15   Historical Provider, MD  ketoconazole (NIZORAL) 2 % cream Apply 1 application topically 2 (two) times daily.  03/30/15   Historical Provider, MD     Family History  Problem Relation Age of Onset  . Diabetes Mother 57    died - HTN  . Stroke Mother   . Other      No early family hx of CAD    Social History   Social History  . Marital status: Legally Separated    Spouse name: N/A  . Number of children: 3  . Years of education: 78   Occupational History  . HAIRDRESSER Unemployed   Social History Main Topics  . Smoking status: Never Smoker  . Smokeless tobacco: Never Used  . Alcohol use No  . Drug use: No  . Sexual activity: Not Currently   Other Topics Concern  . Not on file   Social History Narrative   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   11/04/15 Lives with son    caffeine - coffee, 1-2 cups daily    ECOG  Status: 2 - Symptomatic, <50% confined to bed  Review of Systems: A 12 point ROS discussed and pertinent positives are indicated in the HPI above.  All other systems are negative.  Review of Systems  Vital Signs: BP (!) 145/71 (BP Location: Right Arm, Patient Position: Sitting, Cuff Size: Large)   Pulse 70   Temp 98.1 F (36.7 C) (Oral)   Resp 17   Ht 5\' 4"  (1.626 m)   Wt 254 lb (115.2 kg)   SpO2 98%   BMI 43.60 kg/m   Physical Exam  Mallampati Score:     Imaging: US Venous Img Lower Bilateral  Result Date: 04/26/2016 CLINICAL DATA:  History of bilateral lower extremity DVT, post left-sided lower extremity catheter directed thrombolysis and venous stent placement (procedure completed on 10/04/2015). EXAM: BILATERAL LOWER EXTREMITY VENOUS DOPPLER ULTRASOUND TECHNIQUE: Gray-scale sonography with graded compression, as well as color Doppler and duplex ultrasound were performed to evaluate the lower extremity deep venous systems from the level of the common femoral vein and including the common femoral, femoral, profunda femoral, popliteal and calf veins including the posterior tibial, peroneal and gastrocnemius veins when visible. The superficial great saphenous vein was also interrogated. Spectral Doppler was utilized to evaluate flow at rest and with distal augmentation maneuvers in the common femoral, femoral and popliteal veins. COMPARISON:  Bilateral lower extremity venous Doppler ultrasound - 11/02/2015 FINDINGS: RIGHT LOWER EXTREMITY Common Femoral Vein: No evidence of acute or chronic thrombus. Normal compressibility, respiratory phasicity and response to augmentation. Saphenofemoral Junction: No evidence of thrombus. Normal compressibility and flow on color Doppler imaging. Profunda Femoral Vein: No evidence of acute or chronic thrombus. Normal compressibility and flow on color Doppler imaging. Grossly unchanged mixed echogenic chronic DVT/wall thickening throughout the interrogated  course of the right femoral vein with associated hypertrophied adjacent venous collaterals. Popliteal Vein: Suboptimally visualized. Calf Veins: Suboptimally visualized. Superficial Great Saphenous Vein: No evidence of thrombus. Normal compressibility and flow on color Doppler imaging. Other Findings: There is a moderate to large amount of subcutaneous edema at the level of the right calf. LEFT LOWER EXTREMITY There is grossly unchanged mixed echogenic nonocclusive DVT throughout the left common and deep femoral veins extending to the imaged portions of the atretic appearing popliteal vein, similar to the 10/2015 examination. Calf veins are not well  visualized. Other Findings: Moderate amount of subcutaneous edema is noted at the level of the calf. IMPRESSION: Grossly exchanged extensive bilateral lower extremity DVT without definitive superimposed acute on chronic process. Electronically Signed   By: Sandi Mariscal M.D.   On: 04/26/2016 15:33   Mm Diag Breast Tomo Bilateral  Result Date: 04/02/2016 CLINICAL DATA:  One year follow-up of probably benign calcifications in the outer right breast. EXAM: 2D DIGITAL DIAGNOSTIC BILATERAL MAMMOGRAM WITH CAD AND ADJUNCT TOMO COMPARISON:  Previous exam(s). ACR Breast Density Category a: The breast tissue is almost entirely fatty. FINDINGS: Coarse calcifications in the outer third of the right breast, posterior third, are stable. They span 6 mm, unchanged. No new or suspicious microcalcifications are identified in either breast. Heavy vascular calcifications are noted bilaterally. No mass or architectural distortion is seen within either breast. Mammographic images were processed with CAD. IMPRESSION: Stable benign-appearing calcifications in the outer right breast. No evidence of malignancy in either breast. RECOMMENDATION: Screening mammogram in one year.(Code:SM-B-01Y) I have discussed the findings and recommendations with the patient. Results were also provided in writing  at the conclusion of the visit. If applicable, a reminder letter will be sent to the patient regarding the next appointment. BI-RADS CATEGORY  2: Benign. Electronically Signed   By: Curlene Dolphin M.D.   On: 04/02/2016 15:20    Labs:  CBC:  Recent Labs  10/04/15 1200 10/05/15 0627 10/06/15 0217 10/07/15 0715  WBC 5.7 7.8 7.4 5.6  HGB 10.8* 11.9* 11.2* 10.8*  HCT 33.6* 36.9 34.9* 34.3*  PLT 189 180 197 197    COAGS:  Recent Labs  09/30/15 1912  10/03/15 0329 10/03/15 1046 10/03/15 1851 10/03/15 2040 10/04/15 0500  INR 1.10  --  1.86  --   --   --   --   APTT  --   < > 88* 103* >200* 45* 36  < > = values in this interval not displayed.  BMP:  Recent Labs  10/06/15 0217 10/07/15 0134 01/24/16 1124 01/31/16 1232  NA 135 135 140 138  K 3.9 3.7 4.3 4.7  CL 98* 98* 96 101  CO2 31 30 28 23   GLUCOSE 159* 178* 153* 187*  BUN 16 17 15 17   CALCIUM 9.5 9.7 10.0 9.8  CREATININE 1.08* 1.20* 1.35* 1.09*  GFRNONAA 53* 46* 41* 53*  GFRAA >60 54* 48* 62    LIVER FUNCTION TESTS:  Recent Labs  05/06/15 1843 09/30/15 1659 10/03/15 0329 10/06/15 0217  BILITOT 0.7 0.6 0.5  --   AST 25 19 20   --   ALT 24 15 16   --   ALKPHOS 55 78 62  --   PROT 7.7 8.5* 7.8  --   ALBUMIN 3.7 3.7 3.4* 3.2*    TUMOR MARKERS: No results for input(s): AFPTM, CEA, CA199, CHROMGRNA in the last 8760 hours.  Assessment and Plan:  MEKIA DIPINTO is a 66 y.o. female with past medical history significant for CAD, CHF, obesity, option dependence, diabetes and hypertension who underwent a technically successful left lower extremity catheter direct thrombolysis and venous stent placement, completed on 10/04/2015.   Patient has been maintained on anticoagulation (Xarelto) since November of last year.  Fortunately, the patient states that she is tolerating the anticoagulation without incident.   Bilateral lower extremity venous doppler ultrasound performed today chemistries grossly unchanged  extensive chronic bilateral lower extremity DVT.  As such given the patient's multiple medical comorbidities, sedentary lifestyle and extensive chronic bilateral lower extremity DVT  seen on today's venous Doppler ultrasound, I would recommend the patient continues on anticoagulation indefinitely.  I have again encouraged the patient to increase her activity level (especially walking) as much as possible and to avoid prolonged episodes of sitting.  I also encouraged her to maintain compliance with compression hoses and have given her a prescription for bilateral thigh-high compression hoses.  The patient and the patient's daughter demonstrate excellent understanding of this discussion.   They were encouraged to call the interventional radiology clinic with any future questions or concerns but may otherwise follow up on a when necessary basis.  A copy of this report was sent to the requesting provider on this date.  Electronically Signed: Sandi Mariscal 04/26/2016, 5:19 PM   I spent a total of 15 Minutes in face to face in clinical consultation, greater than 50% of which was counseling/coordinating care for lower extremity DVT.

## 2016-04-30 ENCOUNTER — Ambulatory Visit (INDEPENDENT_AMBULATORY_CARE_PROVIDER_SITE_OTHER): Payer: Medicare HMO | Admitting: *Deleted

## 2016-04-30 ENCOUNTER — Telehealth: Payer: Self-pay | Admitting: Cardiology

## 2016-04-30 DIAGNOSIS — I5022 Chronic systolic (congestive) heart failure: Secondary | ICD-10-CM

## 2016-04-30 DIAGNOSIS — I509 Heart failure, unspecified: Secondary | ICD-10-CM | POA: Diagnosis not present

## 2016-04-30 DIAGNOSIS — Z95810 Presence of automatic (implantable) cardiac defibrillator: Secondary | ICD-10-CM | POA: Diagnosis not present

## 2016-04-30 NOTE — Progress Notes (Signed)
EPIC Encounter for ICM Monitoring  Patient Name: [name] is a 66 y.o. female Date: 04/30/2016 Primary Care Physican: Millsaps, KIMBERLY M, NP Primary Cardiologist:Skains/Weaver PA Electrophysiologist: Allred Dry Weight:unknown       Heart Failure questions reviewed, pt has chronic leg swelling but no worse.   Thoracic impedance is normal but was abnormal suggesting fluid accumulation 04/13/2016 to 04/20/2016.  Prescribed and confirmed dosage: Furosemide 80 mg 1 tablet twice a day. Potassium 20 mEq 1 tablet daily.   Labs:  01/31/2016 Creatinine 1.09, BUN 17, Potassium 4.7, Sodium 138, EGFR 53-62 01/24/2016 Creatinine 1.35, BUN 15, Potassium 4.3, Sodium 140, EGFR 41-48 10/07/2015 Creatinine 1.20, BUN 17, Potassium 3.7, Sodium 135 10/06/2015 Creatinine 1.08, BUN 16, Potassium 3.9, Sodium 135 10/04/2015 Creatinine 0.91, BUN 12, Potassium 4.0, Sodium 137 10/03/2015 Creatinine 1.16, BUN 15, Potassium 4.4, Sodium 133 10/02/2015 Creatinine 1.27, BUN 15, Potassium 3.9, Sodium 137 10/01/2015 Creatinine 1.07, BUN 13, Potassium 3.5, Sodium 137 09/30/2015 Creatinine 0.98, BUN 13, Potassium 4.0, Sodium 138 09/20/2015 Creatinine 0.88, BUN 13, Potassium 3.9, Sodium 136 05/19/2015 Creatinine 1.11, BUN 16, Potassium 4.1, Sodium 138  Recommendations: No changes. Encouraged to call for fluid symptoms.  Follow-up plan: ICM clinic phone appointment on 05/31/2016.     Copy of ICM check sent to device physician.   3 month ICM trend: 04/30/2016   1 Year ICM trend:      Laurie S Short, RN 04/30/2016 4:55 PM    

## 2016-04-30 NOTE — Telephone Encounter (Signed)
Spoke with pt and reminded pt of remote transmission that is due today. Pt verbalized understanding.   

## 2016-04-30 NOTE — Progress Notes (Signed)
Agree. [name], PA-C    04/30/2016 5:26 PM

## 2016-05-07 ENCOUNTER — Encounter: Payer: Self-pay | Admitting: Cardiology

## 2016-05-08 DIAGNOSIS — I454 Nonspecific intraventricular block: Secondary | ICD-10-CM

## 2016-05-08 HISTORY — DX: Nonspecific intraventricular block: I45.4

## 2016-05-08 LAB — CUP PACEART REMOTE DEVICE CHECK
Battery Remaining Longevity: 56 mo
HIGH POWER IMPEDANCE MEASURED VALUE: 35 Ohm
Implantable Lead Implant Date: 20080423
Implantable Lead Implant Date: 20140918
Implantable Lead Location: 753858
Implantable Lead Location: 753859
Implantable Lead Location: 753860
Implantable Lead Model: 6947
Implantable Pulse Generator Implant Date: 20140918
Lead Channel Impedance Value: 400 Ohm
Lead Channel Sensing Intrinsic Amplitude: 1.4 mV
Lead Channel Sensing Intrinsic Amplitude: 12 mV
Lead Channel Setting Pacing Amplitude: 2.5 V
Lead Channel Setting Sensing Sensitivity: 0.5 mV
MDC IDC LEAD IMPLANT DT: 20080423
MDC IDC MSMT BATTERY REMAINING PERCENTAGE: 60 %
MDC IDC MSMT LEADCHNL RA IMPEDANCE VALUE: 390 Ohm
MDC IDC PG SERIAL: 7070007
MDC IDC SESS DTM: 20180501102837
MDC IDC SET LEADCHNL RA PACING AMPLITUDE: 2 V
MDC IDC SET LEADCHNL RV PACING PULSEWIDTH: 0.5 ms
MDC IDC STAT BRADY RA PERCENT PACED: 1 % — AB
MDC IDC STAT BRADY RV PERCENT PACED: 1 % — AB

## 2016-05-17 ENCOUNTER — Other Ambulatory Visit: Payer: Self-pay | Admitting: Cardiology

## 2016-05-18 ENCOUNTER — Telehealth: Payer: Self-pay | Admitting: Cardiology

## 2016-05-18 NOTE — Telephone Encounter (Signed)
Spoke w/ pt and requested that she send a manual transmission b/c her home monitor has not updated in at least 7 days.   

## 2016-05-31 ENCOUNTER — Ambulatory Visit (INDEPENDENT_AMBULATORY_CARE_PROVIDER_SITE_OTHER): Payer: Medicare HMO

## 2016-05-31 ENCOUNTER — Telehealth: Payer: Self-pay | Admitting: Cardiology

## 2016-05-31 DIAGNOSIS — Z95810 Presence of automatic (implantable) cardiac defibrillator: Secondary | ICD-10-CM | POA: Diagnosis not present

## 2016-05-31 DIAGNOSIS — I5022 Chronic systolic (congestive) heart failure: Secondary | ICD-10-CM | POA: Diagnosis not present

## 2016-05-31 NOTE — Telephone Encounter (Signed)
I called pt to confirm her remote transmission that is scheduled for today w/ ICM Clinic Nurse. Pt then informed me that she went to her PCP and they informed her that she had pulled a muscle in her chest. Her PCP told her to call [facility] and inform her Cardiologist. I informed pt that I would send her primary and EP cardiologist a note. Pt verbalized understanding.

## 2016-05-31 NOTE — Telephone Encounter (Signed)
Thank you for the update Candee Furbish, MD

## 2016-06-01 NOTE — Progress Notes (Signed)
EPIC Encounter for ICM Monitoring  Patient Name: [name] is a 66 y.o. female Date: 06/01/2016 Primary Care Physican: Everardo Beals, NP Primary Cardiologist:Skains/Weaver PA Electrophysiologist: Allred Dry Weight:unknown           Heart Failure questions reviewed, pt asymptomatic    Thoracic impedance normal but was abnormal suggesting fluid accumulation from 05/17/2016 to 05/24/2016 and 04/30/2016 to 05/09/2016.  Increase in days that impedance is below baseline suggesting episodes of fluid accumulation.  Prescribed dosage: Furosemide 80 mg 1 tablet twice a day. Potassium 20 mEq 1 tablet daily.   Labs:  01/31/2016 Creatinine 1.09, BUN 17, Potassium 4.7, Sodium 138, EGFR 53-62 01/24/2016 Creatinine 1.35, BUN 15, Potassium 4.3, Sodium 140, EGFR 41-48 10/07/2015 Creatinine 1.20, BUN 17, Potassium 3.7, Sodium 135 10/06/2015 Creatinine 1.08, BUN 16, Potassium 3.9, Sodium 135 10/04/2015 Creatinine 0.91, BUN 12, Potassium 4.0, Sodium 137 10/03/2015 Creatinine 1.16, BUN 15, Potassium 4.4, Sodium 133 10/02/2015 Creatinine 1.27, BUN 15, Potassium 3.9, Sodium 137 10/01/2015 Creatinine 1.07, BUN 13, Potassium 3.5, Sodium 137 09/30/2015 Creatinine 0.98, BUN 13, Potassium 4.0, Sodium 138 09/20/2015 Creatinine 0.88, BUN 13, Potassium 3.9, Sodium 136 05/19/2015 Creatinine 1.11, BUN 16, Potassium 4.1, Sodium 138  Recommendations: Advised to limit salt intake to 2000 mg/day and fluid intake to < 2 liters/day.  Unsure if she is following low salt diet.  Encouraged to call for fluid symptoms or use local ER for any urgent symptoms.  Follow-up plan: ICM clinic phone appointment on 07/02/2016.    Copy of ICM check sent to primary cardiologist and device physician.   3 month ICM trend: 05/31/2016   1 Year ICM trend:      [name], RN 06/01/2016 7:47 AM

## 2016-06-22 ENCOUNTER — Encounter: Payer: Self-pay | Admitting: Interventional Radiology

## 2016-07-02 ENCOUNTER — Telehealth: Payer: Self-pay

## 2016-07-02 ENCOUNTER — Ambulatory Visit (INDEPENDENT_AMBULATORY_CARE_PROVIDER_SITE_OTHER): Payer: Medicare HMO

## 2016-07-02 ENCOUNTER — Telehealth: Payer: Self-pay | Admitting: Cardiology

## 2016-07-02 DIAGNOSIS — I5042 Chronic combined systolic (congestive) and diastolic (congestive) heart failure: Secondary | ICD-10-CM

## 2016-07-02 DIAGNOSIS — Z95810 Presence of automatic (implantable) cardiac defibrillator: Secondary | ICD-10-CM | POA: Diagnosis not present

## 2016-07-02 DIAGNOSIS — I5022 Chronic systolic (congestive) heart failure: Secondary | ICD-10-CM

## 2016-07-02 NOTE — Progress Notes (Signed)
EPIC Encounter for ICM Monitoring  Patient Name: [name] is a 66 y.o. female Date: 07/02/2016 Primary Care Physican: Everardo Beals, NP Primary Cardiologist:Skains/Weaver PA Electrophysiologist: Allred Dry Weight:unknown  Attempted call to patient and unable to reach.  Left detailed message regarding transmission.  Transmission reviewed.    Thoracic impedance normal but was abnormal suggesting fluid accumulation from 06/22/2016 to 06/28/2016.  Prescribed dosage: Furosemide 80 mg 1 tablet twice a day. Potassium 20 mEq 1 tablet daily.   Labs:  01/31/2016 Creatinine 1.09, BUN 17, Potassium 4.7, Sodium 138, EGFR 53-62 01/24/2016 Creatinine 1.35, BUN 15, Potassium 4.3, Sodium 140, EGFR 41-48 10/07/2015 Creatinine 1.20, BUN 17, Potassium 3.7, Sodium 135 10/06/2015 Creatinine 1.08, BUN 16, Potassium 3.9, Sodium 135 10/04/2015 Creatinine 0.91, BUN 12, Potassium 4.0, Sodium 137 10/03/2015 Creatinine 1.16, BUN 15, Potassium 4.4, Sodium 133 10/02/2015 Creatinine 1.27, BUN 15, Potassium 3.9, Sodium 137 10/01/2015 Creatinine 1.07, BUN 13, Potassium 3.5, Sodium 137 09/30/2015 Creatinine 0.98, BUN 13, Potassium 4.0, Sodium 138 09/20/2015 Creatinine 0.88, BUN 13, Potassium 3.9, Sodium 136 05/19/2015 Creatinine 1.11, BUN 16, Potassium 4.1, Sodium 138  Recommendations: Left voice mail with ICM number and encouraged to call for fluid symptoms.  Follow-up plan: ICM clinic phone appointment on 08/06/2016.   Copy of ICM check sent to device physician.   3 month ICM trend: 07/02/2016   1 Year ICM trend:      [name], RN 07/02/2016 1:19 PM

## 2016-07-02 NOTE — Telephone Encounter (Signed)
Remote ICM transmission received.  Attempted patient call and left detailed message regarding transmission and next ICM scheduled for 08/06/2016.  Advised to return call for any fluid symptoms or questions.

## 2016-07-02 NOTE — Telephone Encounter (Signed)
Spoke with pt and reminded pt of remote transmission that is due today. Pt verbalized understanding.   

## 2016-07-12 ENCOUNTER — Telehealth: Payer: Self-pay | Admitting: Cardiology

## 2016-07-12 NOTE — Telephone Encounter (Signed)
Spoke w/ pt and requested that she send a manual transmission b/c her home monitor has not updated in at least 7 days.   

## 2016-07-17 ENCOUNTER — Encounter: Payer: Self-pay | Admitting: Physician Assistant

## 2016-07-25 ENCOUNTER — Telehealth: Payer: Self-pay | Admitting: Cardiology

## 2016-07-25 ENCOUNTER — Telehealth: Payer: Self-pay | Admitting: Internal Medicine

## 2016-07-25 NOTE — Telephone Encounter (Signed)
New message ° ° ° °Pt is returning call to Barbara. °

## 2016-07-25 NOTE — Telephone Encounter (Signed)
Informed patient that remote was received. Patient verbalized understanding. 

## 2016-07-25 NOTE — Telephone Encounter (Signed)
Spoke w/ pt and informed her I was calling to request a manual transmission. Patient was already sending transmission.

## 2016-07-25 NOTE — Telephone Encounter (Signed)
LMOVM requesting that pt send manual transmission b/c home monitor has not updated in at least 7 days.    

## 2016-07-25 NOTE — Telephone Encounter (Signed)
Folow Up:   She wants to know if you received her transmission?

## 2016-07-26 ENCOUNTER — Encounter: Payer: Self-pay | Admitting: Gastroenterology

## 2016-08-06 ENCOUNTER — Telehealth: Payer: Self-pay | Admitting: Cardiology

## 2016-08-06 ENCOUNTER — Ambulatory Visit (INDEPENDENT_AMBULATORY_CARE_PROVIDER_SITE_OTHER): Payer: Medicare HMO | Admitting: *Deleted

## 2016-08-06 DIAGNOSIS — I255 Ischemic cardiomyopathy: Secondary | ICD-10-CM | POA: Diagnosis not present

## 2016-08-06 DIAGNOSIS — I5022 Chronic systolic (congestive) heart failure: Secondary | ICD-10-CM

## 2016-08-06 DIAGNOSIS — Z95810 Presence of automatic (implantable) cardiac defibrillator: Secondary | ICD-10-CM

## 2016-08-06 NOTE — Telephone Encounter (Signed)
Spoke with pt and reminded pt of remote transmission that is due today. Pt verbalized understanding.   

## 2016-08-07 NOTE — Progress Notes (Signed)
Remote ICD transmission.   

## 2016-08-07 NOTE — Progress Notes (Signed)
EPIC Encounter for ICM Monitoring  Patient Name: [name] is a 66 y.o. female Date: 08/07/2016 Primary Care Physican: Millsaps, Kimberly, NP Primary Cardiologist:Skains/Weaver PA Electrophysiologist: Allred Dry Weight:unknown       Heart Failure questions reviewed, pt asymptomatic    Thoracic impedance normal.  Prescribed dosage: Furosemide 80 mg 1 tablet twice a day. Potassium 20 mEq 1 tablet daily.   Labs:  01/31/2016 Creatinine 1.09, BUN 17, Potassium 4.7, Sodium 138, EGFR 53-62 01/24/2016 Creatinine 1.35, BUN 15, Potassium 4.3, Sodium 140, EGFR 41-48 10/07/2015 Creatinine 1.20, BUN 17, Potassium 3.7, Sodium 135 10/06/2015 Creatinine 1.08, BUN 16, Potassium 3.9, Sodium 135 10/04/2015 Creatinine 0.91, BUN 12, Potassium 4.0, Sodium 137 10/03/2015 Creatinine 1.16, BUN 15, Potassium 4.4, Sodium 133 10/02/2015 Creatinine 1.27, BUN 15, Potassium 3.9, Sodium 137 10/01/2015 Creatinine 1.07, BUN 13, Potassium 3.5, Sodium 137 09/30/2015 Creatinine 0.98, BUN 13, Potassium 4.0, Sodium 138 09/20/2015 Creatinine 0.88, BUN 13, Potassium 3.9, Sodium 136 05/19/2015 Creatinine 1.11, BUN 16, Potassium 4.1, Sodium 138  Recommendations: No changes.   Encouraged to call for fluid symptoms.  Follow-up plan: ICM clinic phone appointment on 09/07/2016.    Copy of ICM check sent to device physician.   3 month ICM trend: 08/07/2016   1 Year ICM trend:       S , RN 08/07/2016 3:27 PM    

## 2016-08-10 ENCOUNTER — Encounter: Payer: Self-pay | Admitting: Cardiology

## 2016-09-07 ENCOUNTER — Ambulatory Visit (INDEPENDENT_AMBULATORY_CARE_PROVIDER_SITE_OTHER): Payer: Medicare HMO

## 2016-09-07 DIAGNOSIS — I5022 Chronic systolic (congestive) heart failure: Secondary | ICD-10-CM

## 2016-09-07 DIAGNOSIS — Z95810 Presence of automatic (implantable) cardiac defibrillator: Secondary | ICD-10-CM

## 2016-09-07 LAB — CUP PACEART REMOTE DEVICE CHECK
Battery Remaining Longevity: 54 mo
Brady Statistic RV Percent Paced: 1 % — CL
Date Time Interrogation Session: 20180831124055
HIGH POWER IMPEDANCE MEASURED VALUE: 34 Ohm
Implantable Lead Implant Date: 20080423
Implantable Lead Implant Date: 20140918
Implantable Lead Location: 753859
Implantable Lead Model: 6947
Lead Channel Impedance Value: 340 Ohm
Lead Channel Impedance Value: 390 Ohm
Lead Channel Sensing Intrinsic Amplitude: 1.1 mV
Lead Channel Sensing Intrinsic Amplitude: 12 mV
Lead Channel Setting Pacing Amplitude: 2.5 V
Lead Channel Setting Pacing Pulse Width: 0.5 ms
MDC IDC LEAD IMPLANT DT: 20080423
MDC IDC LEAD LOCATION: 753858
MDC IDC LEAD LOCATION: 753860
MDC IDC MSMT BATTERY REMAINING PERCENTAGE: 58 %
MDC IDC PG IMPLANT DT: 20140918
MDC IDC SET LEADCHNL RA PACING AMPLITUDE: 2 V
MDC IDC SET LEADCHNL RV SENSING SENSITIVITY: 0.5 mV
MDC IDC STAT BRADY RA PERCENT PACED: 1 % — AB
Pulse Gen Serial Number: 7070007

## 2016-09-07 NOTE — Progress Notes (Signed)
EPIC Encounter for ICM Monitoring  Patient Name: [name] is a 66 y.o. female Date: 09/07/2016 Primary Care Physican: Everardo Beals, NP Primary Cardiologist:Skains/Weaver PA Electrophysiologist: Allred Dry Weight:248 lbs       Heart Failure questions reviewed, pt has chronic ankle swelling but it better than it has been   Thoracic impedance normal.  Prescribed dosage: Furosemide 80 mg 1 tablet twice a day. Potassium 20 mEq 1 tablet daily.   Labs:  01/31/2016 Creatinine 1.09, BUN 17, Potassium 4.7, Sodium 138, EGFR 53-62 01/24/2016 Creatinine 1.35, BUN 15, Potassium 4.3, Sodium 140, EGFR 41-48 10/07/2015 Creatinine 1.20, BUN 17, Potassium 3.7, Sodium 135 10/06/2015 Creatinine 1.08, BUN 16, Potassium 3.9, Sodium 135 10/04/2015 Creatinine 0.91, BUN 12, Potassium 4.0, Sodium 137 10/03/2015 Creatinine 1.16, BUN 15, Potassium 4.4, Sodium 133 10/02/2015 Creatinine 1.27, BUN 15, Potassium 3.9, Sodium 137 10/01/2015 Creatinine 1.07, BUN 13, Potassium 3.5, Sodium 137 09/30/2015 Creatinine 0.98, BUN 13, Potassium 4.0, Sodium 138 09/20/2015 Creatinine 0.88, BUN 13, Potassium 3.9, Sodium 136 05/19/2015 Creatinine 1.11, BUN 16, Potassium 4.1, Sodium 138  Recommendations: No changes.   Encouraged to call for fluid symptoms.  Follow-up plan: ICM clinic phone appointment on 10/15/2016.    Copy of ICM check sent to Dr. Rayann Heman.   3 month ICM trend: 09/07/2016   1 Year ICM trend:      [name], RN 09/07/2016 12:08 PM

## 2016-09-13 ENCOUNTER — Ambulatory Visit (INDEPENDENT_AMBULATORY_CARE_PROVIDER_SITE_OTHER): Payer: Medicare HMO | Admitting: Gastroenterology

## 2016-09-13 ENCOUNTER — Telehealth: Payer: Self-pay

## 2016-09-13 ENCOUNTER — Encounter: Payer: Self-pay | Admitting: Gastroenterology

## 2016-09-13 ENCOUNTER — Encounter (INDEPENDENT_AMBULATORY_CARE_PROVIDER_SITE_OTHER): Payer: Self-pay

## 2016-09-13 VITALS — BP 130/72 | HR 72 | Ht 64.0 in | Wt 256.0 lb

## 2016-09-13 DIAGNOSIS — R195 Other fecal abnormalities: Secondary | ICD-10-CM | POA: Diagnosis not present

## 2016-09-13 DIAGNOSIS — I5042 Chronic combined systolic (congestive) and diastolic (congestive) heart failure: Secondary | ICD-10-CM | POA: Diagnosis not present

## 2016-09-13 DIAGNOSIS — Z794 Long term (current) use of insulin: Secondary | ICD-10-CM | POA: Diagnosis not present

## 2016-09-13 DIAGNOSIS — Z7901 Long term (current) use of anticoagulants: Secondary | ICD-10-CM | POA: Diagnosis not present

## 2016-09-13 DIAGNOSIS — Z951 Presence of aortocoronary bypass graft: Secondary | ICD-10-CM

## 2016-09-13 DIAGNOSIS — IMO0001 Reserved for inherently not codable concepts without codable children: Secondary | ICD-10-CM

## 2016-09-13 DIAGNOSIS — E119 Type 2 diabetes mellitus without complications: Secondary | ICD-10-CM

## 2016-09-13 MED ORDER — PEG-KCL-NACL-NASULF-NA ASC-C 100 G PO SOLR: 1.0000 | Freq: Once | ORAL | 0 refills | Status: AC

## 2016-09-13 NOTE — Progress Notes (Addendum)
[name] Consult Note:  History: [name] 09/13/2016  Referring physician: Everardo Beals, NP  Reason for consult/chief complaint: Blood In Stools (Pt has not seen. States that PCP ordered Cologard and it came back positive)   Subjective  HPI:  This is a 66 year old woman referred by Dr. Benna Tran through [place] long, [place] at [facility] urgent care for heme positive stool. The patient is a very limited historian. She recalls doing some kind of the stool kit at home and was told it was "positive". The office note we have available from May 23 indicates heme positive stool. The patient is seen no visible rectal bleeding, denies abdominal pain or altered bowel habits. She denies dysphagia, odynophagia, nausea, vomiting, early satiety or weight loss. She believes she had a colonoscopy by the [facility] about 10 years ago. That report is not currently available. I reviewed her last cardiology office note from December 2017. She has a history of coronary artery bypass, ICD placement, combined systolic and diastolic heart failure with a most recent left ventricular ejection fraction of 40-45% in March 2017. [name] had an extensive left lower extremity DVT in late September 2017. It sounds like she required interventional radiologic procedure and thereafter has been on oral anticoagulation managed by her primary care physician.  ROS:  Review of Systems   Past Medical History: Past Medical History:  Diagnosis Date  . AICD (automatic cardioverter/defibrillator) present   . Angina decubitus ([name]) 11/06/2012  . Angina pectoris ([name])   . Anxiety   . Bundle branch block 05/2016  . CHF (congestive heart failure) ([place])   . Chronic combined systolic and diastolic CHF, NYHA class 3 ([name])   . Coronary artery disease    s/p CABG 2007  . Depression   . Dermal mycosis   . Diabetes mellitus    type II  . DVT (deep venous thrombosis) ([name]) 09/2015   bilateral  .  Hyperlipidemia   . Hypertension   . Insomnia   . Ischemic cardiomyopathy    EF 30-35%, s/p ICD 4/08 by Dr Leonia Reeves  . Morbid obesity ([name]) 11/06/2012  . Obesity   . Oxygen dependent 2 L  . Peripheral neuropathy   . 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 Sleep apnea    uses O2 at night  . Tinea   . Ventricular tachycardia ([name]) 01/16/15   sustained VT terminated with ATP, CL 340 msec     Past Surgical History: Past Surgical History:  Procedure Laterality Date  . BI-VENTRICULAR IMPLANTABLE CARDIOVERTER DEFIBRILLATOR UPGRADE N/A 09/25/2012   Procedure: BI-VENTRICULAR IMPLANTABLE CARDIOVERTER DEFIBRILLATOR UPGRADE;  Surgeon: Coralyn Mark, MD;  Location: [facility] CATH LAB;  Service: Cardiovascular;  Laterality: N/A;  . BREAST BIOPSY Right 04/05/2014  . CARDIAC CATHETERIZATION    . CARDIAC DEFIBRILLATOR PLACEMENT  4/08   by Dr Leonia Reeves (MDT)  . CORONARY ARTERY BYPASS GRAFT  10/05/2005   by Dr Cyndia Bent  . IMPLANTABLE CARDIOVERTER DEFIBRILLATOR IMPLANT  09/25/12   attempt of upgrade to CRT-D unsuccessful due to CS anatomy, SJM Unify Asaura device placed with LV port capped by Dr Rayann Heman  . IR GENERIC HISTORICAL  10/03/2015   IR US GUIDE VASC ACCESS LEFT 10/03/2015 Jacqulynn Cadet, MD [facility]  . IR GENERIC HISTORICAL  10/03/2015   IR VENO/EXT/UNI LEFT 10/03/2015 Jacqulynn Cadet, MD [facility]  . IR  GENERIC HISTORICAL  10/03/2015   IR VENOCAVAGRAM IVC 10/03/2015 Jacqulynn Cadet, MD [facility]  . IR GENERIC HISTORICAL  10/03/2015   IR INFUSION THROMBOL VENOUS INITIAL (MS) 10/03/2015 Jacqulynn Cadet, MD [facility]  . IR GENERIC HISTORICAL  10/03/2015   IR US GUIDE VASC ACCESS LEFT 10/03/2015 Jacqulynn Cadet, MD [facility]  . IR GENERIC HISTORICAL  10/04/2015   IR THROMBECT VENO MECH MOD SED 10/04/2015 Sandi Mariscal, MD [facility]  . IR GENERIC HISTORICAL  10/04/2015   IR TRANSCATH PLC STENT 1ST ART  NOT LE CV CAR VERT CAR 10/04/2015 Sandi Mariscal, MD [facility]  . IR GENERIC HISTORICAL  10/04/2015   IR THROMB F/U EVAL ART/VEN FINAL DAY (MS) 10/04/2015 Sandi Mariscal, MD [facility]  . IR GENERIC HISTORICAL  11/02/2015   IR RADIOLOGIST EVAL & MGMT 11/02/2015 Sandi Mariscal, MD [facility]  . IR RADIOLOGIST EVAL & MGMT  04/26/2016     Family History: Family History  Problem Relation Age of Onset  . Diabetes Mother 27       died - HTN  . Stroke Mother   . Other Unknown        No early family hx of CAD    Social History: Social History   Social History  . Marital status: Legally Separated    Spouse name: N/A  . Number of children: 3  . Years of education: 45   Occupational History  . HAIRDRESSER Unemployed   Social History Main Topics  . Smoking status: Never Smoker  . Smokeless tobacco: Never Used  . Alcohol use No  . Drug use: No  . Sexual activity: Not Currently   Other Topics Concern  . None   Social History Narrative   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   11/04/15 Lives with son    caffeine - coffee, 1-2 cups daily    Allergies: Allergies  Allergen Reactions  . Metformin And Related Itching, Swelling and Other (See Comments)    Leg pain & swelling in legs  . Atorvastatin Itching and Rash  . Levofloxacin Itching    Outpatient Meds: Current Outpatient Prescriptions  Medication Sig Dispense Refill  . albuterol (PROVENTIL HFA;VENTOLIN HFA) 108 (90 BASE) MCG/ACT inhaler Inhale 2 puffs into the lungs every 6 (six) hours as needed for wheezing or shortness of breath.    Marland Kitchen albuterol (PROVENTIL) (2.5 MG/3ML) 0.083% nebulizer solution Take 2.5 mg by nebulization every 6 (six) hours as needed for wheezing or shortness of breath.     Marland Kitchen aspirin EC 81 MG EC tablet Take 1 tablet (81 mg total) by mouth daily. 30 tablet 12  . carvedilol (COREG) 12.5 MG tablet Take 12.5 mg by mouth 2 (two) times daily with a meal.     . Choline  Fenofibrate (FENOFIBRIC ACID) 135 MG CPDR 135 mg.    . furosemide (LASIX) 80 MG tablet TAKE 1 TABLET TWICE DAILY 180 tablet 1  . glipiZIDE (GLUCOTROL) 5 MG tablet Take 5 mg by mouth daily before breakfast.     . hydrOXYzine (VISTARIL) 50 MG capsule as needed.    . IRON PO Take 1 tablet by mouth daily.    . isosorbide mononitrate (IMDUR) 30 MG 24 hr tablet TAKE 1 TABLET EVERY DAY 90 tablet 1  . ketoconazole (NIZORAL) 2 % cream Apply 1 application topically 2 (two) times daily.     Marland Kitchen latanoprost (XALATAN) 0.005 % ophthalmic solution Place 1 drop into both eyes at  bedtime.    Marland Kitchen LEVEMIR FLEXTOUCH 100 UNIT/ML Pen     . lisinopril (PRINIVIL,ZESTRIL) 10 MG tablet Take 10 mg by mouth daily.     . methocarbamol (ROBAXIN) 500 MG tablet Take 1 tablet (500 mg total) by mouth every 8 (eight) hours as needed for muscle spasms. 45 tablet 0  . nitroGLYCERIN (NITROSTAT) 0.4 MG SL tablet Place 0.4 mg under the tongue every 5 (five) minutes as needed for chest pain.    Marland Kitchen NOVOLOG FLEXPEN 100 UNIT/ML FlexPen Inject 30-50 Units into the skin See admin instructions. 50 units every morning, 30 units at lunch and 50 units at suppre    . potassium chloride SA (K-DUR,KLOR-CON) 20 MEQ tablet Take 20 mEq by mouth.    . potassium phosphate, monobasic, (K-PHOS ORIGINAL) 500 MG tablet Take 500 mg by mouth daily as needed (takes when needed for spasms).     . rosuvastatin (CRESTOR) 20 MG tablet Take 1 tablet (20 mg total) by mouth daily at 6 PM. 30 tablet 2  . spironolactone (ALDACTONE) 25 MG tablet Take 0.5 tablets (12.5 mg total) by mouth daily. 30 tablet 11  . traMADol (ULTRAM) 50 MG tablet Take 50 mg by mouth 2 (two) times daily.    Alveda Reasons 20 MG TABS tablet Take 20 mg by mouth daily.    . peg 3350 powder (MOVIPREP) 100 g SOLR Take 1 kit (200 g total) by mouth once. 1 kit 0   No current facility-administered medications for this visit.        ___________________________________________________________________ Objective   Exam:  BP 130/72   Pulse 72   Ht 5' 4"  (1.626 m)   Wt 256 lb (116.1 kg)   BMI 43.94 kg/m    General: this is a(n) Chronically ill-appearing, morbidly obese woman. Her son is present for the entire exam. She has decreased mobility and require significant assistance on the exam table.   Eyes: sclera anicteric, no redness  ENT: oral mucosa moist without lesions, no cervical or supraclavicular lymphadenopathy, good dentition  CV: RRR without murmur, S1/S2, no JVD, 3+ peripheral edema bilaterally.  ICD left upper chest wall, sternotomy scar  Resp: clear to auscultation bilaterally, normal RR and effort noted  GI: soft, no tenderness, with active bowel sounds. No guarding or palpable organomegaly noted. Orderly obese  Skin; warm and dry, no rash or jaundice noted  Neuro: awake, alert and oriented x 3. Normal gross motor function and fluent speech  Labs:  No recent labs   Assessment: Encounter Diagnoses  Name Primary?  . Heme positive stool Yes  . Chronic combined systolic and diastolic CHF, NYHA class 3 ([place])   . Current use of long term anticoagulation   . Insulin dependent diabetes mellitus ([place])   . Morbid obesity ([place])   . S/P CABG x 5     Heme positive stool, many years since last colonoscopy. Complicated cardiac history and prior DVT on long-term anticoagulant. She needs a colonoscopy to rule out neoplasia, she must be off herOAC 2 days prior, so we will coordinate with primary care. Due to her medical complexity, her procedure will be done in the outpatient hospital endoscopy lab. She understands that she is at increased risk for cardiopulmonary complications related to sedation and post-polypectomy bleeding. She and her son are agreeable after a thorough discussion of the procedure and risks.  The benefits and risks of the planned procedure were described in detail with the  patient or (when appropriate) their health care proxy.  Risks were outlined as including, but not limited to, bleeding, infection, perforation, adverse medication reaction leading to cardiac or pulmonary decompensation, or pancreatitis (if ERCP).  The limitation of incomplete mucosal visualization was also discussed.  No guarantees or warranties were given.  Check CBC today  Thank you for the courtesy of this consult.  Please call me with any questions or concerns.  Nelida Meuse III  CC: Jamie Beals, NP    Addendum:   Colonoscopy report rec'd from 08/02/08 by Dr. Howell Rucks. Normal colonoscopy.  - HD

## 2016-09-13 NOTE — Patient Instructions (Signed)
If you are age 66 or older, your body mass index should be between 23-30. Your Body mass index is 43.94 kg/m. If this is out of the aforementioned range listed, please consider follow up with your Primary Care Provider.  If you are age 75 or younger, your body mass index should be between 19-25. Your Body mass index is 43.94 kg/m. If this is out of the aformentioned range listed, please consider follow up with your Primary Care Provider.   You have been scheduled for a colonoscopy. Please follow written instructions given to you at your visit today.  Please pick up your prep supplies at the pharmacy within the next 1-3 days. If you use inhalers (even only as needed), please bring them with you on the day of your procedure.  Your physician has requested that you go to the basement for the following lab work before leaving today: CBC  Thank you for choosing [facility]  Dr Wilfrid Lund III

## 2016-09-13 NOTE — Telephone Encounter (Signed)
RE: ZYAIR RHEIN DOB: Jun 07, 1950 MRN: [id number]   Cyril Mourning,    We have scheduled the above patient for an endoscopic procedure (colonoscopy) . Our records show that she is on anticoagulation therapy.   Please advise as to how long the patient may come off her therapy of Xarelto prior to the procedure, which is scheduled for 10-12-2016.  Sincerely,  Elias Else

## 2016-10-02 ENCOUNTER — Other Ambulatory Visit: Payer: Self-pay | Admitting: Physician Assistant

## 2016-10-03 NOTE — Telephone Encounter (Signed)
Please call office and schedule appointment for future refills

## 2016-10-08 ENCOUNTER — Telehealth: Payer: Self-pay | Admitting: Gastroenterology

## 2016-10-08 NOTE — Telephone Encounter (Signed)
Routed to [employer].

## 2016-10-09 NOTE — Telephone Encounter (Signed)
Pt has been notified and aware.  

## 2016-10-09 NOTE — Telephone Encounter (Signed)
See documentation from [facility] the pharmacist. OK to hold Xarelto 2 days prior. Pt notified and aware

## 2016-10-09 NOTE — Telephone Encounter (Signed)
rerouted communication to coumadin pharmacist Marcene Brawn. Pt aware I will inform her of final result today.

## 2016-10-09 NOTE — Telephone Encounter (Signed)
Patient with diagnosis of DVT on Xarelto for anticoagulation.    Procedure: colonoscopy Date of procedure: 10/12/16  CrCl 43.8 (with IBW calculation) Platelet count 197  Per office protocol, patient can hold Xarelto for 2 days prior to procedure.    Patient should restart Xarelto on the evening of procedure or day after, at discretion of procedure MD

## 2016-10-10 ENCOUNTER — Encounter (HOSPITAL_COMMUNITY): Payer: Self-pay | Admitting: *Deleted

## 2016-10-12 ENCOUNTER — Ambulatory Visit (HOSPITAL_COMMUNITY)
Admission: RE | Admit: 2016-10-12 | Discharge: 2016-10-12 | Disposition: A | Discharge status: Home / Self Care | Payer: Medicare HMO | Source: Ambulatory Visit | Attending: Gastroenterology | Admitting: Gastroenterology

## 2016-10-12 ENCOUNTER — Encounter (HOSPITAL_COMMUNITY)
Admission: RE | Disposition: A | Discharge status: Home / Self Care | Payer: Self-pay | Source: Ambulatory Visit | Attending: Gastroenterology

## 2016-10-12 ENCOUNTER — Encounter (HOSPITAL_COMMUNITY): Payer: Self-pay | Admitting: Certified Registered Nurse Anesthetist

## 2016-10-12 ENCOUNTER — Ambulatory Visit (HOSPITAL_COMMUNITY): Payer: Medicare HMO | Admitting: Certified Registered Nurse Anesthetist

## 2016-10-12 DIAGNOSIS — Z888 Allergy status to other drugs, medicaments and biological substances status: Secondary | ICD-10-CM | POA: Insufficient documentation

## 2016-10-12 DIAGNOSIS — F329 Major depressive disorder, single episode, unspecified: Secondary | ICD-10-CM | POA: Diagnosis not present

## 2016-10-12 DIAGNOSIS — E1140 Type 2 diabetes mellitus with diabetic neuropathy, unspecified: Secondary | ICD-10-CM | POA: Insufficient documentation

## 2016-10-12 DIAGNOSIS — M1990 Unspecified osteoarthritis, unspecified site: Secondary | ICD-10-CM | POA: Insufficient documentation

## 2016-10-12 DIAGNOSIS — Z7901 Long term (current) use of anticoagulants: Secondary | ICD-10-CM

## 2016-10-12 DIAGNOSIS — E785 Hyperlipidemia, unspecified: Secondary | ICD-10-CM | POA: Diagnosis not present

## 2016-10-12 DIAGNOSIS — I2510 Atherosclerotic heart disease of native coronary artery without angina pectoris: Secondary | ICD-10-CM | POA: Diagnosis not present

## 2016-10-12 DIAGNOSIS — Z951 Presence of aortocoronary bypass graft: Secondary | ICD-10-CM | POA: Insufficient documentation

## 2016-10-12 DIAGNOSIS — Z9981 Dependence on supplemental oxygen: Secondary | ICD-10-CM | POA: Insufficient documentation

## 2016-10-12 DIAGNOSIS — F419 Anxiety disorder, unspecified: Secondary | ICD-10-CM | POA: Insufficient documentation

## 2016-10-12 DIAGNOSIS — Z881 Allergy status to other antibiotic agents status: Secondary | ICD-10-CM | POA: Insufficient documentation

## 2016-10-12 DIAGNOSIS — G4700 Insomnia, unspecified: Secondary | ICD-10-CM | POA: Diagnosis not present

## 2016-10-12 DIAGNOSIS — Q438 Other specified congenital malformations of intestine: Secondary | ICD-10-CM | POA: Diagnosis not present

## 2016-10-12 DIAGNOSIS — G4730 Sleep apnea, unspecified: Secondary | ICD-10-CM | POA: Insufficient documentation

## 2016-10-12 DIAGNOSIS — D123 Benign neoplasm of transverse colon: Secondary | ICD-10-CM | POA: Diagnosis not present

## 2016-10-12 DIAGNOSIS — IMO0001 Reserved for inherently not codable concepts without codable children: Secondary | ICD-10-CM

## 2016-10-12 DIAGNOSIS — Z95810 Presence of automatic (implantable) cardiac defibrillator: Secondary | ICD-10-CM | POA: Insufficient documentation

## 2016-10-12 DIAGNOSIS — Z7984 Long term (current) use of oral hypoglycemic drugs: Secondary | ICD-10-CM | POA: Diagnosis not present

## 2016-10-12 DIAGNOSIS — I110 Hypertensive heart disease with heart failure: Secondary | ICD-10-CM | POA: Diagnosis not present

## 2016-10-12 DIAGNOSIS — I5042 Chronic combined systolic (congestive) and diastolic (congestive) heart failure: Secondary | ICD-10-CM

## 2016-10-12 DIAGNOSIS — K6289 Other specified diseases of anus and rectum: Secondary | ICD-10-CM | POA: Diagnosis not present

## 2016-10-12 DIAGNOSIS — R195 Other fecal abnormalities: Secondary | ICD-10-CM

## 2016-10-12 DIAGNOSIS — E119 Type 2 diabetes mellitus without complications: Secondary | ICD-10-CM

## 2016-10-12 DIAGNOSIS — I255 Ischemic cardiomyopathy: Secondary | ICD-10-CM | POA: Diagnosis not present

## 2016-10-12 DIAGNOSIS — Z86718 Personal history of other venous thrombosis and embolism: Secondary | ICD-10-CM | POA: Diagnosis not present

## 2016-10-12 DIAGNOSIS — Z794 Long term (current) use of insulin: Secondary | ICD-10-CM

## 2016-10-12 DIAGNOSIS — Z6841 Body Mass Index (BMI) 40.0 and over, adult: Secondary | ICD-10-CM | POA: Diagnosis not present

## 2016-10-12 HISTORY — DX: Unspecified osteoarthritis, unspecified site: M19.90

## 2016-10-12 HISTORY — PX: COLONOSCOPY WITH PROPOFOL: SHX5780

## 2016-10-12 LAB — GLUCOSE, CAPILLARY
Glucose-Capillary: 164 mg/dL — ABNORMAL HIGH (ref 65–99)
Glucose-Capillary: 161 mg/dL — ABNORMAL HIGH (ref 65–99)

## 2016-10-12 SURGERY — COLONOSCOPY WITH PROPOFOL
Anesthesia: Monitor Anesthesia Care

## 2016-10-12 MED ORDER — LACTATED RINGERS IV SOLN
INTRAVENOUS | Status: DC
  Administered 2016-10-12: 11:00:00 via INTRAVENOUS

## 2016-10-12 MED ORDER — PROPOFOL 10 MG/ML IV BOLUS
INTRAVENOUS | Status: AC
  Filled 2016-10-12: qty 60

## 2016-10-12 MED ORDER — PROPOFOL 500 MG/50ML IV EMUL
INTRAVENOUS | Status: DC | PRN
  Administered 2016-10-12: 125 ug/kg/min via INTRAVENOUS

## 2016-10-12 MED ORDER — SODIUM CHLORIDE 0.9 % IV SOLN: INTRAVENOUS | Status: DC

## 2016-10-12 MED ORDER — PROPOFOL 10 MG/ML IV BOLUS
INTRAVENOUS | Status: DC | PRN
  Administered 2016-10-12: 50 mg via INTRAVENOUS

## 2016-10-12 MED ORDER — LIDOCAINE 2% (20 MG/ML) 5 ML SYRINGE
INTRAMUSCULAR | Status: DC | PRN
  Administered 2016-10-12: 100 mg via INTRAVENOUS

## 2016-10-12 MED ORDER — LIDOCAINE 2% (20 MG/ML) 5 ML SYRINGE
INTRAMUSCULAR | Status: AC
  Filled 2016-10-12: qty 5

## 2016-10-12 SURGICAL SUPPLY — 22 items

## 2016-10-12 NOTE — H&P (Signed)
History:  This patient presents for endoscopic testing for heme positive stool.  [name] Referring physician: Everardo Beals, NP  Past Medical History: Past Medical History:  Diagnosis Date  . AICD (automatic cardioverter/defibrillator) present   . Angina decubitus ([place]) 11/06/2012  . Angina pectoris ([place])   . Anxiety   . Arthritis   . Bundle branch block 05/2016  . CHF (congestive heart failure) ([place])   . Chronic combined systolic and diastolic CHF, NYHA class 3 ([place])   . Coronary artery disease    s/p CABG 2007  . Depression   . Dermal mycosis   . Diabetes mellitus    type II  . DVT (deep venous thrombosis) ([place]) 09/2015   bilateral  . Hyperlipidemia   . Hypertension   . Insomnia   . Ischemic cardiomyopathy    EF 30-35%, s/p ICD 4/08 by Dr Leonia Reeves  . Morbid obesity ([place]) 11/06/2012  . Obesity   . Oxygen dependent 2 L  . Peripheral neuropathy   . 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 Sleep apnea    uses O2 at night  . Tinea   . Ventricular tachycardia ([place]) 01/16/15   sustained VT terminated with ATP, CL 340 msec     Past Surgical History: Past Surgical History:  Procedure Laterality Date  . BI-VENTRICULAR IMPLANTABLE CARDIOVERTER DEFIBRILLATOR UPGRADE N/A 09/25/2012   Procedure: BI-VENTRICULAR IMPLANTABLE CARDIOVERTER DEFIBRILLATOR UPGRADE;  Surgeon: Coralyn Mark, MD;  Location: [facility] CATH LAB;  Service: Cardiovascular;  Laterality: N/A;  . BREAST BIOPSY Right 04/05/2014  . CARDIAC CATHETERIZATION    . CARDIAC DEFIBRILLATOR PLACEMENT  4/08   by Dr Leonia Reeves (MDT)  . CORONARY ARTERY BYPASS GRAFT  10/05/2005   by Dr Cyndia Bent  . IMPLANTABLE CARDIOVERTER DEFIBRILLATOR IMPLANT  09/25/12   attempt of upgrade to CRT-D unsuccessful due to CS anatomy, SJM Unify Asaura device placed with LV port capped by Dr Rayann Heman  . IR GENERIC HISTORICAL  10/03/2015   IR US GUIDE VASC ACCESS  LEFT 10/03/2015 Jacqulynn Cadet, MD [facility]  . IR GENERIC HISTORICAL  10/03/2015   IR VENO/EXT/UNI LEFT 10/03/2015 Jacqulynn Cadet, MD [facility]  . IR GENERIC HISTORICAL  10/03/2015   IR VENOCAVAGRAM IVC 10/03/2015 Jacqulynn Cadet, MD [facility]  . IR GENERIC HISTORICAL  10/03/2015   IR INFUSION THROMBOL VENOUS INITIAL (MS) 10/03/2015 Jacqulynn Cadet, MD [facility]  . IR GENERIC HISTORICAL  10/03/2015   IR US GUIDE VASC ACCESS LEFT 10/03/2015 Jacqulynn Cadet, MD [facility]  . IR GENERIC HISTORICAL  10/04/2015   IR THROMBECT VENO MECH MOD SED 10/04/2015 Sandi Mariscal, MD [facility]  . IR GENERIC HISTORICAL  10/04/2015   IR TRANSCATH PLC STENT 1ST ART NOT LE CV CAR VERT CAR 10/04/2015 Sandi Mariscal, MD [facility]  . IR GENERIC HISTORICAL  10/04/2015   IR THROMB F/U EVAL ART/VEN FINAL DAY (MS) 10/04/2015 Sandi Mariscal, MD [facility]  . IR GENERIC HISTORICAL  11/02/2015   IR RADIOLOGIST EVAL & MGMT 11/02/2015 Sandi Mariscal, MD [facility]  . IR RADIOLOGIST EVAL & MGMT  04/26/2016    Allergies: Allergies  Allergen Reactions  . Metformin And Related Itching, Swelling and Other (See Comments)    Leg pain & swelling in legs  . Atorvastatin Itching and Rash  . Levofloxacin Itching    Outpatient Meds: Current Facility-Administered Medications  Medication  Dose Route Frequency Provider Last Rate Last Dose  . 0.9 %  sodium chloride infusion   Intravenous Continuous Danis, Estill Cotta III, MD          ___________________________________________________________________ Objective   Exam:  BP (!) 153/59   Pulse 60   Temp 98.4 F (36.9 C) (Oral)   Resp (!) 22   Ht 5\' 4"  (1.626 m)   Wt 256 lb (116.1 kg)   SpO2 98%   BMI 43.94 kg/m    CV: RRR without murmur, S1/S2, no JVD, no peripheral edema  Resp: clear to auscultation bilaterally, normal RR and effort noted  GI: morbidly obese, soft, no tenderness, with active bowel sounds. No guarding or palpable organomegaly  noted.  Neuro: awake, alert and oriented x 3. Normal gross motor function and fluent speech   Assessment:  Heme positive stool  Plan:  colonoscopy   [name]

## 2016-10-12 NOTE — Anesthesia Postprocedure Evaluation (Signed)
Anesthesia Post Note  Patient: [name]  Procedure(s) Performed: COLONOSCOPY WITH PROPOFOL (N/A )     Patient location during evaluation: Endoscopy Anesthesia Type: MAC Level of consciousness: awake and alert, patient cooperative and oriented Pain management: pain level controlled Vital Signs Assessment: post-procedure vital signs reviewed and stable Respiratory status: spontaneous breathing, nonlabored ventilation and respiratory function stable Cardiovascular status: blood pressure returned to baseline and stable Postop Assessment: no apparent nausea or vomiting Anesthetic complications: no    Last Vitals:  Vitals:   10/12/16 1150 10/12/16 1200  BP: 133/61 (!) 151/62  Pulse: 66 66  Resp: (!) 27 (!) 33  Temp:    SpO2: 94% 97%    Last Pain:  Vitals:   10/12/16 1146  TempSrc: Oral                 Jovin Fester,E. Vincenzo Stave

## 2016-10-12 NOTE — Discharge Instructions (Signed)
Colonoscopy, Adult A colonoscopy is an exam to look at the entire large intestine. During the exam, a lubricated, bendable tube is inserted into the anus and then passed into the rectum, colon, and other parts of the large intestine. A colonoscopy is often done as a part of normal colorectal screening or in response to certain symptoms, such as anemia, persistent diarrhea, abdominal pain, and blood in the stool. The exam can help screen for and diagnose medical problems, including:  Tumors.  Polyps.  Inflammation.  Areas of bleeding.  Tell a health care provider about:  Any allergies you have.  All medicines you are taking, including vitamins, herbs, eye drops, creams, and over-the-counter medicines.  Any problems you or family members have had with anesthetic medicines.  Any blood disorders you have.  Any surgeries you have had.  Any medical conditions you have.  Any problems you have had passing stool. What are the risks? Generally, this is a safe procedure. However, problems may occur, including:  Bleeding.  A tear in the intestine.  A reaction to medicines given during the exam.  Infection (rare).  What happens before the procedure? Eating and drinking restrictions Follow instructions from your health care provider about eating and drinking, which may include:  A few days before the procedure - follow a low-fiber diet. Avoid nuts, seeds, dried fruit, raw fruits, and vegetables.  1-3 days before the procedure - follow a clear liquid diet. Drink only clear liquids, such as clear broth or bouillon, black coffee or tea, clear juice, clear soft drinks or sports drinks, gelatin dessert, and popsicles. Avoid any liquids that contain red or purple dye.  On the day of the procedure - do not eat or drink anything during the 2 hours before the procedure, or within the time period that your health care provider recommends.  Bowel prep If you were prescribed an oral bowel prep  to clean out your colon:  Take it as told by your health care provider. Starting the day before your procedure, you will need to drink a large amount of medicated liquid. The liquid will cause you to have multiple loose stools until your stool is almost clear or light green.  If your skin or anus gets irritated from diarrhea, you may use these to relieve the irritation: ? Medicated wipes, such as adult wet wipes with aloe and vitamin E. ? A skin soothing-product like petroleum jelly.  If you vomit while drinking the bowel prep, take a break for up to 60 minutes and then begin the bowel prep again. If vomiting continues and you cannot take the bowel prep without vomiting, call your health care provider.  General instructions  Ask your health care provider about changing or stopping your regular medicines. This is especially important if you are taking diabetes medicines or blood thinners.  Plan to have someone take you home from the hospital or clinic. What happens during the procedure?  An IV tube may be inserted into one of your veins.  You will be given medicine to help you relax (sedative).  To reduce your risk of infection: ? Your health care team will wash or sanitize their hands. ? Your anal area will be washed with soap.  You will be asked to lie on your side with your knees bent.  Your health care provider will lubricate a long, thin, flexible tube. The tube will have a camera and a light on the end.  The tube will be inserted into your  anus.  The tube will be gently eased through your rectum and colon.  Air will be delivered into your colon to keep it open. You may feel some pressure or cramping.  The camera will be used to take images during the procedure.  A small tissue sample may be removed from your body to be examined under a microscope (biopsy). If any potential problems are found, the tissue will be sent to a lab for testing.  If small polyps are found, your  health care provider may remove them and have them checked for cancer cells.  The tube that was inserted into your anus will be slowly removed. The procedure may vary among health care providers and hospitals. What happens after the procedure?  Your blood pressure, heart rate, breathing rate, and blood oxygen level will be monitored until the medicines you were given have worn off.  Do not drive for 24 hours after the exam.  You may have a small amount of blood in your stool.  You may pass gas and have mild abdominal cramping or bloating due to the air that was used to inflate your colon during the exam.  It is up to you to get the results of your procedure. Ask your health care provider, or the department performing the procedure, when your results will be ready. This information is not intended to replace advice given to you by your health care provider. Make sure you discuss any questions you have with your health care provider. Document Released: 12/23/1999 Document Revised: 10/26/2015 Document Reviewed: 03/08/2015 Elsevier Interactive Patient Education  2018 [employer].   Colon Polyps Polyps are tissue growths inside the body. Polyps can grow in many places, including the large intestine (colon). A polyp may be a round bump or a mushroom-shaped growth. You could have one polyp or several. Most colon polyps are noncancerous (benign). However, some colon polyps can become cancerous over time. What are the causes? The exact cause of colon polyps is not known. What increases the risk? This condition is more likely to develop in people who:  Have a family history of colon cancer or colon polyps.  Are older than 7 or older than 45 if they are African American.  Have inflammatory bowel disease, such as ulcerative colitis or Crohn disease.  Are overweight.  Smoke cigarettes.  Do not get enough exercise.  Drink too much alcohol.  Eat a diet that is: ? High in fat and red  meat. ? Low in fiber.  Had childhood cancer that was treated with abdominal radiation.  What are the signs or symptoms? Most polyps do not cause symptoms. If you have symptoms, they may include:  Blood coming from your rectum when having a bowel movement.  Blood in your stool.The stool may look dark red or black.  A change in bowel habits, such as constipation or diarrhea.  How is this diagnosed? This condition is diagnosed with a colonoscopy. This is a procedure that uses a lighted, flexible scope to look at the inside of your colon. How is this treated? Treatment for this condition involves removing any polyps that are found. Those polyps will then be tested for cancer. If cancer is found, your health care provider will talk to you about options for colon cancer treatment. Follow these instructions at home: Diet  Eat plenty of fiber, such as fruits, vegetables, and whole grains.  Eat foods that are high in calcium and vitamin D, such as milk, cheese, yogurt, eggs, liver,  fish, and broccoli.  Limit foods high in fat, red meats, and processed meats, such as hot dogs, sausage, bacon, and lunch meats.  Maintain a healthy weight, or lose weight if recommended by your health care provider. General instructions  Do not smoke cigarettes.  Do not drink alcohol excessively.  Keep all follow-up visits as told by your health care provider. This is important. This includes keeping regularly scheduled colonoscopies. Talk to your health care provider about when you need a colonoscopy.  Exercise every day or as told by your health care provider. Contact a health care provider if:  You have new or worsening bleeding during a bowel movement.  You have new or increased blood in your stool.  You have a change in bowel habits.  You unexpectedly lose weight. This information is not intended to replace advice given to you by your health care provider. Make sure you discuss any questions you  have with your health care provider. Document Released: 09/21/2003 Document Revised: 06/02/2015 Document Reviewed: 11/15/2014 Elsevier Interactive Patient Education  [employer].

## 2016-10-12 NOTE — Interval H&P Note (Signed)
History and Physical Interval Note:  10/12/2016 10:41 AM  [name]  has presented today for surgery, with the diagnosis of Heme positive stool   The various methods of treatment have been discussed with the patient and family. After consideration of risks, benefits and other options for treatment, the patient has consented to  Procedure(s): COLONOSCOPY WITH PROPOFOL (N/A) as a surgical intervention .  The patient's history has been reviewed, patient examined, no change in status, stable for surgery.  I have reviewed the patient's chart and labs.  Questions were answered to the patient's satisfaction.     Nelida Meuse III

## 2016-10-12 NOTE — Transfer of Care (Signed)
Immediate Anesthesia Transfer of Care Note  Patient: [name]  Procedure(s) Performed: COLONOSCOPY WITH PROPOFOL (N/A )  Patient Location: PACU and Endoscopy Unit  Anesthesia Type:MAC  Level of Consciousness: awake and alert   Airway & Oxygen Therapy: Patient Spontanous Breathing  Post-op Assessment: Report given to RN and Post -op Vital signs reviewed and stable  Post vital signs: Reviewed and stable  Last Vitals:  Vitals:   10/12/16 1028  BP: (!) 153/59  Pulse: 60  Resp: (!) 22  Temp: 36.9 C  SpO2: 98%    Last Pain:  Vitals:   10/12/16 1028  TempSrc: Oral         Complications: No apparent anesthesia complications

## 2016-10-12 NOTE — Op Note (Signed)
[facility] Patient Name: [name] Procedure Date: 10/12/2016 MRN: [id number] Attending MD: Estill Cotta. Loletha Carrow , MD Date of Birth: 03-15-1950 CSN: [id number] Age: 66 Admit Type: Outpatient Procedure:                Colonoscopy Indications:              Heme positive stool Providers:                Mallie Mussel L. Loletha Carrow, MD, Laverta Baltimore RN, RN,                            Alan Mulder, Technician Referring MD:              Medicines:                Monitored Anesthesia Care Complications:            No immediate complications. Estimated Blood Loss:     Estimated blood loss was minimal. Procedure:                Pre-Anesthesia Assessment:                           - Prior to the procedure, a History and Physical                            was performed, and patient medications and                            allergies were reviewed. The patient's tolerance of                            previous anesthesia was also reviewed. The risks                            and benefits of the procedure and the sedation                            options and risks were discussed with the patient.                            All questions were answered, and informed consent                            was obtained. Prior Anticoagulants: The patient has                            taken Xarelto (rivaroxaban), last dose was 2 days                            prior to procedure. ASA Grade Assessment: III - A                            patient with severe systemic disease. After  reviewing the risks and benefits, the patient was                            deemed in satisfactory condition to undergo the                            procedure.                           After obtaining informed consent, the colonoscope                            was passed under direct vision. Throughout the                            procedure, the patient's blood pressure, pulse, and                         oxygen saturations were monitored continuously. The                            EC-[id number] ([id number]) scope was introduced through                            the anus and advanced to the the cecum, identified                            by appendiceal orifice and ileocecal valve. The                            colonoscopy was performed with difficulty due to                            significant looping and the patient's body habitus.                            Successful completion of the procedure was aided by                            using manual pressure. The patient tolerated the                            procedure well. The quality of the bowel                            preparation was excellent. The ileocecal valve,                            appendiceal orifice, and rectum were photographed.                            The quality of the bowel preparation was evaluated  using the BBPS [facility] Bowel Preparation Scale)                            with scores of: Right Colon = 3, Transverse Colon =                            3 and Left Colon = 3 (entire mucosa seen well with                            no residual staining, small fragments of stool or                            opaque liquid). The total BBPS score equals 9. The                            bowel preparation used was SUPREP. Scope In: 11:18:05 AM Scope Out: 11:37:22 AM Scope Withdrawal Time: 0 hours 9 minutes 5 seconds  Total Procedure Duration: 0 hours 19 minutes 17 seconds  Findings:      The digital rectal exam findings include decreased sphincter tone.      The colon (entire examined portion) was moderately redundant.      A 2 mm polyp was found in the distal transverse colon. The polyp was       sessile. The polyp was removed with a cold biopsy forceps. Resection and       retrieval were complete.      Retroflexion in the rectum was not performed due to anatomy.       The exam was otherwise without abnormality. Impression:               - Decreased sphincter tone found on digital rectal                            exam.                           - Redundant colon.                           - One 2 mm polyp in the distal transverse colon,                            removed with a cold biopsy forceps. Resected and                            retrieved.                           - The examination was otherwise normal. Moderate Sedation:      MAC sedation used Recommendation:           - Patient has a contact number available for                            emergencies. The signs and symptoms of potential  delayed complications were discussed with the                            patient. Return to normal activities tomorrow.                            Written discharge instructions were provided to the                            patient.                           - Resume previous diet.                           - Continue present medications.                           - Resume Xarelto (rivaroxaban) at prior dose today.                           - Await pathology results.                           - Repeat colonoscopy is recommended for                            surveillance. The colonoscopy date will be                            determined after pathology results from today's                            exam become available for review. Procedure Code(s):        --- Professional ---                           [phone], Colonoscopy, flexible; with biopsy, single                            or multiple Diagnosis Code(s):        --- Professional ---                           K62.89, Other specified diseases of anus and rectum                           D12.3, Benign neoplasm of transverse colon (hepatic                            flexure or splenic flexure)                           R19.5, Other fecal abnormalities                            Q43.8, Other specified congenital malformations  of                            intestine CPT copyright 2016 American Medical Association. All rights reserved. The codes documented in this report are preliminary and upon coder review may  be revised to meet current compliance requirements. Shanel Prazak L. Loletha Carrow, MD 10/12/2016 11:45:20 AM This report has been signed electronically. Number of Addenda: 0

## 2016-10-12 NOTE — Anesthesia Preprocedure Evaluation (Addendum)
Anesthesia Evaluation  Patient identified by MRN, date of birth, ID band Patient awake    Reviewed: Allergy & Precautions, NPO status , Patient's Chart, lab work & pertinent test results, reviewed documented beta blocker date and time   History of Anesthesia Complications Negative for: history of anesthetic complications  Airway Mallampati: II  TM Distance: >3 FB Neck ROM: Full    Dental  (+) Upper Dentures, Edentulous Lower   Pulmonary sleep apnea and Oxygen sleep apnea , COPD,  oxygen dependent,    breath sounds clear to auscultation       Cardiovascular hypertension, Pt. on medications and Pt. on home beta blockers (-) angina+ CABG and + DVT  + dysrhythmias Ventricular Tachycardia + Cardiac Defibrillator  Rhythm:Regular Rate:Normal  '17 ECHO: EF 40-45%, valves OK   Neuro/Psych    GI/Hepatic negative GI ROS, Neg liver ROS,   Endo/Other  diabetes, Oral Hypoglycemic Agents, Insulin DependentMorbid obesity  Renal/GU Renal InsufficiencyRenal disease     Musculoskeletal   Abdominal (+) + obese,   Peds  Hematology xarelto   Anesthesia Other Findings   Reproductive/Obstetrics                            Anesthesia Physical Anesthesia Plan  ASA: III  Anesthesia Plan: MAC   Post-op Pain Management:    Induction:   PONV Risk Score and Plan: 2 and Treatment may vary due to age or medical condition  Airway Management Planned: Natural Airway and Nasal Cannula  Additional Equipment:   Intra-op Plan:   Post-operative Plan:   Informed Consent: I have reviewed the patients History and Physical, chart, labs and discussed the procedure including the risks, benefits and alternatives for the proposed anesthesia with the patient or authorized representative who has indicated his/her understanding and acceptance.   Dental advisory given  Plan Discussed with: CRNA and Surgeon  Anesthesia Plan  Comments: (Plan routine monitors, MAC)        Anesthesia Quick Evaluation

## 2016-10-15 ENCOUNTER — Encounter (HOSPITAL_COMMUNITY): Payer: Self-pay | Admitting: Gastroenterology

## 2016-10-15 ENCOUNTER — Ambulatory Visit (INDEPENDENT_AMBULATORY_CARE_PROVIDER_SITE_OTHER): Payer: Medicare HMO

## 2016-10-15 DIAGNOSIS — I5022 Chronic systolic (congestive) heart failure: Secondary | ICD-10-CM | POA: Diagnosis not present

## 2016-10-15 DIAGNOSIS — Z95810 Presence of automatic (implantable) cardiac defibrillator: Secondary | ICD-10-CM

## 2016-10-15 NOTE — Progress Notes (Signed)
EPIC Encounter for ICM Monitoring  Patient Name: [name] is a 66 y.o. female Date: 10/15/2016 Primary Care Physican: Everardo Beals, NP Primary Cardiologist:Skains/Weaver PA Electrophysiologist: Allred Dry Weight:247 lbs       Heart Failure questions reviewed, pt asymptomatic.   Thoracic impedance normal.  Prescribed dosage: Furosemide 80 mg 1 tablet twice a day. Potassium 20 mEq 1 tablet daily.   Labs:  01/31/2016 Creatinine 1.09, BUN 17, Potassium 4.7, Sodium 138, EGFR 53-62 01/24/2016 Creatinine 1.35, BUN 15, Potassium 4.3, Sodium 140, EGFR 41-48 10/07/2015 Creatinine 1.20, BUN 17, Potassium 3.7, Sodium 135 10/06/2015 Creatinine 1.08, BUN 16, Potassium 3.9, Sodium 135 10/04/2015 Creatinine 0.91, BUN 12, Potassium 4.0, Sodium 137 10/03/2015 Creatinine 1.16, BUN 15, Potassium 4.4, Sodium 133 10/02/2015 Creatinine 1.27, BUN 15, Potassium 3.9, Sodium 137 10/01/2015 Creatinine 1.07, BUN 13, Potassium 3.5, Sodium 137 09/30/2015 Creatinine 0.98, BUN 13, Potassium 4.0, Sodium 138 09/20/2015 Creatinine 0.88, BUN 13, Potassium 3.9, Sodium 136 05/19/2015 Creatinine 1.11, BUN 16, Potassium 4.1, Sodium 138  Recommendations: No changes.   Encouraged to call for fluid symptoms.  Follow-up plan: ICM clinic phone appointment on 11/15/2016.  Advised her to make appointments with Dr Marlou Porch (last visit with [name] 12/19/2015)) and Dr Rayann Heman (last visit 01/17/2015)  Copy of ICM check sent to Dr. Rayann Heman.   3 month ICM trend: 10/15/2016   1 Year ICM trend:      [name], RN 10/15/2016 8:12 AM

## 2016-10-16 ENCOUNTER — Encounter: Payer: Self-pay | Admitting: Gastroenterology

## 2016-11-15 ENCOUNTER — Telehealth: Payer: Self-pay | Admitting: Cardiology

## 2016-11-15 ENCOUNTER — Ambulatory Visit (INDEPENDENT_AMBULATORY_CARE_PROVIDER_SITE_OTHER): Payer: Medicare HMO | Admitting: *Deleted

## 2016-11-15 DIAGNOSIS — Z95810 Presence of automatic (implantable) cardiac defibrillator: Secondary | ICD-10-CM | POA: Diagnosis not present

## 2016-11-15 DIAGNOSIS — I255 Ischemic cardiomyopathy: Secondary | ICD-10-CM

## 2016-11-15 DIAGNOSIS — I5022 Chronic systolic (congestive) heart failure: Secondary | ICD-10-CM | POA: Diagnosis not present

## 2016-11-15 NOTE — Telephone Encounter (Signed)
Spoke with pt and reminded pt of remote transmission that is due today. Pt verbalized understanding.   

## 2016-11-16 ENCOUNTER — Encounter: Payer: Self-pay | Admitting: Cardiology

## 2016-11-16 ENCOUNTER — Telehealth: Payer: Self-pay

## 2016-11-16 NOTE — Progress Notes (Signed)
EPIC Encounter for ICM Monitoring  Patient Name: [name] is a 66 y.o. female Date: 11/16/2016 Primary Care Physican: Everardo Beals, NP Primary Cardiologist:Skains/Weaver PA Electrophysiologist: Allred Dry Weight: previous weight 247 lbs        Attempted call to patient and unable to reach.  Left detailed message regarding transmission.  Transmission reviewed.    [place]: Thoracic impedance normal.  Prescribed dosage: Furosemide 80 mg 1 tablet twice a day. Potassium 20 mEq 1 tablet daily.   Labs:  01/31/2016 Creatinine 1.09, BUN 17, Potassium 4.7, Sodium 138, EGFR 53-62 01/24/2016 Creatinine 1.35, BUN 15, Potassium 4.3, Sodium 140, EGFR 41-48 10/07/2015 Creatinine 1.20, BUN 17, Potassium 3.7, Sodium 135 10/06/2015 Creatinine 1.08, BUN 16, Potassium 3.9, Sodium 135 10/04/2015 Creatinine 0.91, BUN 12, Potassium 4.0, Sodium 137 10/03/2015 Creatinine 1.16, BUN 15, Potassium 4.4, Sodium 133 10/02/2015 Creatinine 1.27, BUN 15, Potassium 3.9, Sodium 137 10/01/2015 Creatinine 1.07, BUN 13, Potassium 3.5, Sodium 137 09/30/2015 Creatinine 0.98, BUN 13, Potassium 4.0, Sodium 138 09/20/2015 Creatinine 0.88, BUN 13, Potassium 3.9, Sodium 136 05/19/2015 Creatinine 1.11, BUN 16, Potassium 4.1, Sodium 138  Recommendations: Left voice mail with ICM number and encouraged to call if experiencing any fluid symptoms.  Follow-up plan: ICM clinic phone appointment on 12/18/2016.  Office appointment scheduled 01/18/2017 with Dr. Rayann Heman and 01/29/2016 with Dr Marlou Porch.  Copy of ICM check sent to Dr. Rayann Heman.   3 month ICM trend: 11/15/2016    1 Year ICM trend:       [name], RN 11/16/2016 10:43 AM

## 2016-11-16 NOTE — Progress Notes (Signed)
Remote ICD transmission.   

## 2016-11-16 NOTE — Telephone Encounter (Signed)
Remote ICM transmission received.  Attempted call to patient and left detailed message per DPR regarding transmission and next ICM scheduled for 12/18/2016.  Advised to return call for any fluid symptoms or questions.    

## 2016-12-03 LAB — CUP PACEART REMOTE DEVICE CHECK
Date Time Interrogation Session: 20181126195027
HighPow Impedance: 38 Ohm
Implantable Lead Implant Date: 20140918
Implantable Lead Location: 753859
Implantable Lead Location: 753860
Implantable Lead Model: 6947
Implantable Pulse Generator Implant Date: 20140918
Lead Channel Impedance Value: 380 Ohm
MDC IDC LEAD IMPLANT DT: 20080423
MDC IDC LEAD IMPLANT DT: 20080423
MDC IDC LEAD LOCATION: 753858
MDC IDC MSMT LEADCHNL RA SENSING INTR AMPL: 1.7 mV
MDC IDC MSMT LEADCHNL RV IMPEDANCE VALUE: 440 Ohm
MDC IDC MSMT LEADCHNL RV SENSING INTR AMPL: 12 mV — AB
Pulse Gen Serial Number: 7070007

## 2016-12-05 ENCOUNTER — Other Ambulatory Visit: Payer: Self-pay | Admitting: Physician Assistant

## 2016-12-18 ENCOUNTER — Ambulatory Visit (INDEPENDENT_AMBULATORY_CARE_PROVIDER_SITE_OTHER): Payer: Medicare HMO

## 2016-12-18 DIAGNOSIS — Z95810 Presence of automatic (implantable) cardiac defibrillator: Secondary | ICD-10-CM

## 2016-12-18 DIAGNOSIS — I5022 Chronic systolic (congestive) heart failure: Secondary | ICD-10-CM | POA: Diagnosis not present

## 2016-12-24 NOTE — Progress Notes (Signed)
EPIC Encounter for ICM Monitoring  Patient Name: [name] is a 66 y.o. female Date: 12/24/2016 Primary Care Physican: Everardo Beals, NP Primary Cardiologist:Skains/Weaver PA Electrophysiologist: Allred Dry Weight: previous weight 247lbs       Transmission received.   Thoracic impedance normal.  Prescribed dosage: Furosemide 80 mg 1 tablet twice a day. Potassium 20 mEq 1 tablet daily.   Labs:  01/31/2016 Creatinine 1.09, BUN 17, Potassium 4.7, Sodium 138, EGFR 53-62 01/24/2016 Creatinine 1.35, BUN 15, Potassium 4.3, Sodium 140, EGFR 41-48 10/07/2015 Creatinine 1.20, BUN 17, Potassium 3.7, Sodium 135 10/06/2015 Creatinine 1.08, BUN 16, Potassium 3.9, Sodium 135 10/04/2015 Creatinine 0.91, BUN 12, Potassium 4.0, Sodium 137 10/03/2015 Creatinine 1.16, BUN 15, Potassium 4.4, Sodium 133 10/02/2015 Creatinine 1.27, BUN 15, Potassium 3.9, Sodium 137 10/01/2015 Creatinine 1.07, BUN 13, Potassium 3.5, Sodium 137 09/30/2015 Creatinine 0.98, BUN 13, Potassium 4.0, Sodium 138 09/20/2015 Creatinine 0.88, BUN 13, Potassium 3.9, Sodium 136 05/19/2015 Creatinine 1.11, BUN 16, Potassium 4.1, Sodium 138  Recommendations: None.  Follow-up plan: ICM clinic phone appointment on 01/18/2017.    Copy of ICM check sent to Dr. Rayann Heman.   3 month ICM trend: 10/20/2016    1 Year ICM trend:       [name], RN 12/24/2016 11:05 AM

## 2017-01-02 DIAGNOSIS — F329 Major depressive disorder, single episode, unspecified: Secondary | ICD-10-CM | POA: Insufficient documentation

## 2017-01-02 DIAGNOSIS — G4700 Insomnia, unspecified: Secondary | ICD-10-CM | POA: Insufficient documentation

## 2017-01-02 DIAGNOSIS — G4730 Sleep apnea, unspecified: Secondary | ICD-10-CM | POA: Insufficient documentation

## 2017-01-02 DIAGNOSIS — M1990 Unspecified osteoarthritis, unspecified site: Secondary | ICD-10-CM | POA: Insufficient documentation

## 2017-01-02 DIAGNOSIS — B369 Superficial mycosis, unspecified: Secondary | ICD-10-CM | POA: Insufficient documentation

## 2017-01-02 DIAGNOSIS — E785 Hyperlipidemia, unspecified: Secondary | ICD-10-CM | POA: Insufficient documentation

## 2017-01-02 DIAGNOSIS — F419 Anxiety disorder, unspecified: Secondary | ICD-10-CM | POA: Insufficient documentation

## 2017-01-02 DIAGNOSIS — E669 Obesity, unspecified: Secondary | ICD-10-CM | POA: Insufficient documentation

## 2017-01-02 DIAGNOSIS — I509 Heart failure, unspecified: Secondary | ICD-10-CM | POA: Insufficient documentation

## 2017-01-02 DIAGNOSIS — G629 Polyneuropathy, unspecified: Secondary | ICD-10-CM | POA: Insufficient documentation

## 2017-01-02 DIAGNOSIS — Z9981 Dependence on supplemental oxygen: Secondary | ICD-10-CM | POA: Insufficient documentation

## 2017-01-02 DIAGNOSIS — B359 Dermatophytosis, unspecified: Secondary | ICD-10-CM | POA: Insufficient documentation

## 2017-01-02 DIAGNOSIS — Z95810 Presence of automatic (implantable) cardiac defibrillator: Secondary | ICD-10-CM | POA: Insufficient documentation

## 2017-01-02 DIAGNOSIS — F32A Depression, unspecified: Secondary | ICD-10-CM | POA: Insufficient documentation

## 2017-01-02 DIAGNOSIS — I2510 Atherosclerotic heart disease of native coronary artery without angina pectoris: Secondary | ICD-10-CM | POA: Insufficient documentation

## 2017-01-18 ENCOUNTER — Ambulatory Visit (INDEPENDENT_AMBULATORY_CARE_PROVIDER_SITE_OTHER): Payer: Medicare HMO | Admitting: Internal Medicine

## 2017-01-18 ENCOUNTER — Encounter: Payer: Self-pay | Admitting: Internal Medicine

## 2017-01-18 VITALS — BP 134/72 | HR 56 | Ht 64.0 in | Wt 256.0 lb

## 2017-01-18 DIAGNOSIS — I255 Ischemic cardiomyopathy: Secondary | ICD-10-CM

## 2017-01-18 DIAGNOSIS — I519 Heart disease, unspecified: Secondary | ICD-10-CM | POA: Diagnosis not present

## 2017-01-18 DIAGNOSIS — Z95810 Presence of automatic (implantable) cardiac defibrillator: Secondary | ICD-10-CM | POA: Diagnosis not present

## 2017-01-18 NOTE — Patient Instructions (Signed)
Medication Instructions:  Your physician recommends that you continue on your current medications as directed. Please refer to the Current Medication list given to you today.   Labwork: Your physician recommends that you return for lab work today: BMP/BNP   Testing/Procedures: None ordered   Follow-Up: Remote monitoring is used to monitor your  ICD from home. This monitoring reduces the number of office visits required to check your device to one time per year. It allows [name] to keep an eye on the functioning of your device to ensure it is working properly. You are scheduled for a device check from home on 02/09/17. You may send your transmission at any time that day. If you have a wireless device, the transmission will be sent automatically. After your physician reviews your transmission, you will receive a postcard with your next transmission date.  Your physician wants you to follow-up in: 12 months with Chanetta Marshall, NP You will receive a reminder letter in the mail two months in advance. If you don't receive a letter, please call our office to schedule the follow-up appointment.     Any Other Special Instructions Will Be Listed Below (If Applicable).     If you need a refill on your cardiac medications before your next appointment, please call your pharmacy.

## 2017-01-18 NOTE — Progress Notes (Signed)
PCP: Everardo Beals, NP Primary Cardiologist:  Dr Marlou Porch Primary EP: Dr Rayann Heman  [name] is a 67 y.o. female who presents today for routine electrophysiology followup.  Since last being seen in our clinic, the patient reports doing reasonably well.  She has chronic leg pain and is not very active. She feels that SOB is controlled.  + stable edema. Today, she denies symptoms of palpitations, chest pain, dizziness, presyncope, syncope, or ICD shocks.  The patient is otherwise without complaint today.   Past Medical History:  Diagnosis Date  . AICD (automatic cardioverter/defibrillator) present   . Angina decubitus ([place]) 11/06/2012  . Angina pectoris ([place])   . Anxiety   . Arthritis   . Bundle branch block 05/2016  . CHF (congestive heart failure) ([place])   . Chronic combined systolic and diastolic CHF, NYHA class 3 ([place])   . Coronary artery disease    s/p CABG 2007  . Depression   . Dermal mycosis   . Diabetes mellitus    type II  . DVT (deep venous thrombosis) ([place]) 09/2015   bilateral  . Hyperlipidemia   . Hypertension   . Insomnia   . Ischemic cardiomyopathy    EF 30-35%, s/p ICD 4/08 by Dr Leonia Reeves  . Morbid obesity ([place]) 11/06/2012  . Obesity   . Oxygen dependent 2 L  . Peripheral neuropathy   . 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 Sleep apnea    uses O2 at night  . Tinea   . Ventricular tachycardia ([place]) 01/16/15   sustained VT terminated with ATP, CL 340 msec   Past Surgical History:  Procedure Laterality Date  . BI-VENTRICULAR IMPLANTABLE CARDIOVERTER DEFIBRILLATOR UPGRADE N/A 09/25/2012   Procedure: BI-VENTRICULAR IMPLANTABLE CARDIOVERTER DEFIBRILLATOR UPGRADE;  Surgeon: Coralyn Mark, MD;  Location: [facility] CATH LAB;  Service: Cardiovascular;  Laterality: N/A;  . BREAST BIOPSY Right 04/05/2014  . CARDIAC CATHETERIZATION    . CARDIAC DEFIBRILLATOR PLACEMENT  4/08   by  Dr Leonia Reeves (MDT)  . COLONOSCOPY WITH PROPOFOL N/A 10/12/2016   Procedure: COLONOSCOPY WITH PROPOFOL;  Surgeon: Doran Stabler, MD;  Location: [facility];  Service: Gastroenterology;  Laterality: N/A;  . CORONARY ARTERY BYPASS GRAFT  10/05/2005   by Dr Cyndia Bent  . IMPLANTABLE CARDIOVERTER DEFIBRILLATOR IMPLANT  09/25/12   attempt of upgrade to CRT-D unsuccessful due to CS anatomy, SJM Unify Asaura device placed with LV port capped by Dr Rayann Heman  . IR GENERIC HISTORICAL  10/03/2015   IR US GUIDE VASC ACCESS LEFT 10/03/2015 Jacqulynn Cadet, MD [facility]  . IR GENERIC HISTORICAL  10/03/2015   IR VENO/EXT/UNI LEFT 10/03/2015 Jacqulynn Cadet, MD [facility]  . IR GENERIC HISTORICAL  10/03/2015   IR VENOCAVAGRAM IVC 10/03/2015 Jacqulynn Cadet, MD [facility]  . IR GENERIC HISTORICAL  10/03/2015   IR INFUSION THROMBOL VENOUS INITIAL (MS) 10/03/2015 Jacqulynn Cadet, MD [facility]  . IR GENERIC HISTORICAL  10/03/2015   IR US GUIDE VASC ACCESS LEFT 10/03/2015 Jacqulynn Cadet, MD [facility]  . IR GENERIC HISTORICAL  10/04/2015   IR THROMBECT VENO MECH MOD SED 10/04/2015 Sandi Mariscal, MD [facility]  . IR GENERIC HISTORICAL  10/04/2015   IR TRANSCATH PLC STENT 1ST ART NOT LE CV CAR VERT CAR 10/04/2015 Sandi Mariscal, MD [facility]  . IR GENERIC HISTORICAL  10/04/2015   IR THROMB F/U  EVAL ART/VEN FINAL DAY (MS) 10/04/2015 Sandi Mariscal, MD [facility]  . IR GENERIC HISTORICAL  11/02/2015   IR RADIOLOGIST EVAL & MGMT 11/02/2015 Sandi Mariscal, MD [facility]  . IR RADIOLOGIST EVAL & MGMT  04/26/2016    ROS- all systems are reviewed and negative except as per HPI above  Current Outpatient Medications  Medication Sig Dispense Refill  . albuterol (PROVENTIL HFA;VENTOLIN HFA) 108 (90 BASE) MCG/ACT inhaler Inhale 2 puffs into the lungs every 6 (six) hours as needed for wheezing or shortness of breath.    Marland Kitchen albuterol (PROVENTIL) (2.5 MG/3ML) 0.083% nebulizer solution Take 2.5 mg by nebulization every  6 (six) hours as needed for wheezing or shortness of breath.     Marland Kitchen amitriptyline (ELAVIL) 10 MG tablet Take 10 mg by mouth daily.    . Capsaicin (MUSCLE RELIEF EX) Apply 1 application topically 3 (three) times daily as needed. For pain    . carvedilol (COREG) 12.5 MG tablet Take 12.5 mg by mouth 2 (two) times daily with a meal.     . ferrous sulfate 300 (60 Fe) MG/5ML syrup Take 300 mg by mouth daily with lunch.    . furosemide (LASIX) 80 MG tablet TAKE 1 TABLET TWICE DAILY 180 tablet 1  . glipiZIDE (GLUCOTROL) 5 MG tablet Take 5 mg by mouth daily before breakfast.     . ibuprofen (ADVIL,MOTRIN) 200 MG tablet Take 200 mg by mouth every 8 (eight) hours as needed (for pain).    . isosorbide mononitrate (IMDUR) 30 MG 24 hr tablet Take 1 tablet (30 mg total) by mouth daily. 90 tablet 0  . ketoconazole (NIZORAL) 2 % cream Apply 1 application topically 2 (two) times daily as needed (for skin irritation).     Marland Kitchen latanoprost (XALATAN) 0.005 % ophthalmic solution Place 1 drop into both eyes at bedtime.    Marland Kitchen LEVEMIR FLEXTOUCH 100 UNIT/ML Pen Inject 15-20 Units into the skin at bedtime. Patient states she takes every other day in the pm.  Patient takes 15-20 units if glucose is greater than 140 per patient    . lisinopril (PRINIVIL,ZESTRIL) 10 MG tablet Take 10 mg by mouth daily.     . methocarbamol (ROBAXIN) 500 MG tablet Take 1 tablet (500 mg total) by mouth every 8 (eight) hours as needed for muscle spasms. 45 tablet 0  . nitroGLYCERIN (NITROSTAT) 0.4 MG SL tablet Place 0.4 mg under the tongue every 5 (five) minutes as needed for chest pain.    Marland Kitchen NOVOLOG FLEXPEN 100 UNIT/ML FlexPen Inject 30-50 Units into the skin See admin instructions. 50 units every morning, 25 units at lunch and 50 units at supper *Hold for low blood sugar*    . Potassium Chloride ER 20 MEQ TBCR Take 20 mEq by mouth daily.    . rosuvastatin (CRESTOR) 20 MG tablet Take 1 tablet (20 mg total) by mouth daily at 6 PM. (Patient taking  differently: Take 20 mg by mouth daily. ) 30 tablet 2  . spironolactone (ALDACTONE) 25 MG tablet Take 0.5 tablets (12.5 mg total) by mouth daily. (Patient taking differently: Take 25 mg by mouth daily. ) 30 tablet 11  . traMADol (ULTRAM) 50 MG tablet Take 50 mg by mouth 2 (two) times daily.    Alveda Reasons 20 MG TABS tablet Take 20 mg by mouth daily.      No current facility-administered medications for this visit.     Physical Exam: Vitals:   01/18/17 1211  BP: 134/72  Pulse: (!) 56  SpO2: 98%  Weight: 256 lb (116.1 kg)  Height: 5\' 4"  (1.626 m)    GEN- The patient is obese appearing, alert and oriented x 3 today.   Head- normocephalic, atraumatic Eyes-  Sclera clear, conjunctiva pink Ears- hearing intact Oropharynx- clear Lungs- Clear to ausculation bilaterally, normal work of breathing Chest- ICD pocket is well healed Heart- Regular rate and rhythm, no murmurs, rubs or gallops, PMI not laterally displaced GI- soft, NT, ND, + BS Extremities- no clubbing, cyanosis, + dependant edema, SCDs in place  ICD interrogation- reviewed in detail today,  See PACEART report  ekg tracing ordered today is personally reviewed and shows sinus bradycardia 56 bpm, LBBB  Assessment and Plan:  1.  Chronic systolic dysfunction/ ischemic CM/ LBBB euvolemic today Stable on an appropriate medical regimen Normal ICD function See Pace Art report No changes today She has a BiV Device with the LV port plugged.   She has discussed with CV surgery and is not interested in epicardial LV lead previously. Bmet, bnp today Followed in ICM device clinic by Sharman Cheek  2. Morbid obesity Body mass index is 43.94 kg/m. Weight reduction encouraged Body mass index is 44.93 kg/(m^2).    Overdue to see Dr Mliss Fritz Return to see EP NP in a year  Thompson Grayer MD, [facility] 01/18/2017 12:48 PM

## 2017-01-19 LAB — BASIC METABOLIC PANEL
BUN/Creatinine Ratio: 7 — ABNORMAL LOW (ref 12–28)
BUN: 6 mg/dL — AB (ref 8–27)
CALCIUM: 9.6 mg/dL (ref 8.7–10.3)
CHLORIDE: 99 mmol/L (ref 96–106)
CO2: 25 mmol/L (ref 20–29)
Creatinine, Ser: 0.91 mg/dL (ref 0.57–1.00)
GFR calc non Af Amer: 66 mL/min/{1.73_m2} (ref >59)
GFR, EST AFRICAN AMERICAN: 76 mL/min/{1.73_m2} (ref >59)
Glucose: 254 mg/dL — ABNORMAL HIGH (ref 65–99)
POTASSIUM: 4.6 mmol/L (ref 3.5–5.2)
Sodium: 140 mmol/L (ref 134–144)

## 2017-01-19 LAB — PRO B NATRIURETIC PEPTIDE: NT-PRO BNP: 3174 pg/mL — AB (ref 0–301)

## 2017-01-31 ENCOUNTER — Ambulatory Visit (INDEPENDENT_AMBULATORY_CARE_PROVIDER_SITE_OTHER): Payer: Medicare HMO | Admitting: Cardiology

## 2017-01-31 ENCOUNTER — Encounter: Payer: Self-pay | Admitting: Cardiology

## 2017-01-31 VITALS — BP 124/78 | HR 91 | Ht 64.0 in | Wt 248.8 lb

## 2017-01-31 DIAGNOSIS — I255 Ischemic cardiomyopathy: Secondary | ICD-10-CM | POA: Diagnosis not present

## 2017-01-31 DIAGNOSIS — I519 Heart disease, unspecified: Secondary | ICD-10-CM

## 2017-01-31 DIAGNOSIS — I2510 Atherosclerotic heart disease of native coronary artery without angina pectoris: Secondary | ICD-10-CM | POA: Diagnosis not present

## 2017-01-31 DIAGNOSIS — E119 Type 2 diabetes mellitus without complications: Secondary | ICD-10-CM

## 2017-01-31 DIAGNOSIS — I10 Essential (primary) hypertension: Secondary | ICD-10-CM

## 2017-01-31 DIAGNOSIS — Z95810 Presence of automatic (implantable) cardiac defibrillator: Secondary | ICD-10-CM | POA: Diagnosis not present

## 2017-01-31 NOTE — Progress Notes (Signed)
Cardiology Office Note:    Date:  01/31/2017   ID:  [name], DOB 02-Dec-1950, MRN [id number]  PCP:  Everardo Beals, NP  Cardiologist:  No primary care provider on file.   Referring MD: Everardo Beals, NP     History of Present Illness:    [name] is a 67 y.o. female with coronary disease status post CABG 3295, systolic heart failure with excellent improvement of EF 40-45%, last check with ICD in place, morbid obesity followed as well by Dr. Rayann Heman here for follow-up.  Continue to use wheelchair, not very active.  Leg pain.  Shortness of breath has been fairly well controlled.  Edema no significant changes. Her ICD is functioning well.  She is not interested in epicardial LV lead.  Her LV port is plugged.  She really has no new complaints.  She has had DVT in the past, chronic-continuing with Xarelto.  No bleeding episodes.  Past Medical History:  Diagnosis Date  . AICD (automatic cardioverter/defibrillator) present   . Angina decubitus ([place]) 11/06/2012  . Angina pectoris ([place])   . Anxiety   . Arthritis   . Bundle branch block 05/2016  . CHF (congestive heart failure) ([place])   . Chronic combined systolic and diastolic CHF, NYHA class 3 ([place])   . Coronary artery disease    s/p CABG 2007  . Depression   . Dermal mycosis   . Diabetes mellitus    type II  . DVT (deep venous thrombosis) ([place]) 09/2015   bilateral  . Hyperlipidemia   . Hypertension   . Insomnia   . Ischemic cardiomyopathy    EF 30-35%, s/p ICD 4/08 by Dr Leonia Reeves  . Morbid obesity ([place]) 11/06/2012  . Obesity   . Oxygen dependent 2 L  . Peripheral neuropathy   . 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 Sleep apnea    uses O2 at night  . Tinea   . Ventricular tachycardia ([place]) 01/16/15   sustained VT terminated with ATP, CL 340 msec    Past Surgical History:  Procedure Laterality Date  .  BI-VENTRICULAR IMPLANTABLE CARDIOVERTER DEFIBRILLATOR UPGRADE N/A 09/25/2012   Procedure: BI-VENTRICULAR IMPLANTABLE CARDIOVERTER DEFIBRILLATOR UPGRADE;  Surgeon: Coralyn Mark, MD;  Location: [facility] CATH LAB;  Service: Cardiovascular;  Laterality: N/A;  . BREAST BIOPSY Right 04/05/2014  . CARDIAC CATHETERIZATION    . CARDIAC DEFIBRILLATOR PLACEMENT  4/08   by Dr Leonia Reeves (MDT)  . COLONOSCOPY WITH PROPOFOL N/A 10/12/2016   Procedure: COLONOSCOPY WITH PROPOFOL;  Surgeon: Doran Stabler, MD;  Location: [facility];  Service: Gastroenterology;  Laterality: N/A;  . CORONARY ARTERY BYPASS GRAFT  10/05/2005   by Dr Cyndia Bent  . IMPLANTABLE CARDIOVERTER DEFIBRILLATOR IMPLANT  09/25/12   attempt of upgrade to CRT-D unsuccessful due to CS anatomy, SJM Unify Asaura device placed with LV port capped by Dr Rayann Heman  . IR GENERIC HISTORICAL  10/03/2015   IR US GUIDE VASC ACCESS LEFT 10/03/2015 Jacqulynn Cadet, MD [facility]  . IR GENERIC HISTORICAL  10/03/2015   IR VENO/EXT/UNI LEFT 10/03/2015 Jacqulynn Cadet, MD [facility]  . IR GENERIC HISTORICAL  10/03/2015   IR VENOCAVAGRAM IVC 10/03/2015 Jacqulynn Cadet, MD [facility]  . IR GENERIC HISTORICAL  10/03/2015   IR INFUSION THROMBOL VENOUS INITIAL (MS) 10/03/2015 Jacqulynn Cadet, MD [facility]  . IR GENERIC HISTORICAL  10/03/2015  IR US GUIDE VASC ACCESS LEFT 10/03/2015 Jacqulynn Cadet, MD [facility]  . IR GENERIC HISTORICAL  10/04/2015   IR THROMBECT VENO MECH MOD SED 10/04/2015 Sandi Mariscal, MD [facility]  . IR GENERIC HISTORICAL  10/04/2015   IR TRANSCATH PLC STENT 1ST ART NOT LE CV CAR VERT CAR 10/04/2015 Sandi Mariscal, MD [facility]  . IR GENERIC HISTORICAL  10/04/2015   IR THROMB F/U EVAL ART/VEN FINAL DAY (MS) 10/04/2015 Sandi Mariscal, MD [facility]  . IR GENERIC HISTORICAL  11/02/2015   IR RADIOLOGIST EVAL & MGMT 11/02/2015 Sandi Mariscal, MD [facility]  . IR RADIOLOGIST EVAL & MGMT  04/26/2016    Current Medications: Current Meds    Medication Sig  . albuterol (PROVENTIL HFA;VENTOLIN HFA) 108 (90 BASE) MCG/ACT inhaler Inhale 2 puffs into the lungs every 6 (six) hours as needed for wheezing or shortness of breath.  Marland Kitchen albuterol (PROVENTIL) (2.5 MG/3ML) 0.083% nebulizer solution Take 2.5 mg by nebulization every 6 (six) hours as needed for wheezing or shortness of breath.   Marland Kitchen amitriptyline (ELAVIL) 10 MG tablet Take 10 mg by mouth daily.  . Capsaicin (MUSCLE RELIEF EX) Apply 1 application topically 3 (three) times daily as needed. For pain  . carvedilol (COREG) 12.5 MG tablet Take 12.5 mg by mouth 2 (two) times daily with a meal.   . ferrous sulfate 300 (60 Fe) MG/5ML syrup Take 300 mg by mouth daily with lunch.  . furosemide (LASIX) 80 MG tablet TAKE 1 TABLET TWICE DAILY  . glipiZIDE (GLUCOTROL) 5 MG tablet Take 5 mg by mouth daily before breakfast.   . ibuprofen (ADVIL,MOTRIN) 200 MG tablet Take 200 mg by mouth every 8 (eight) hours as needed (for pain).  . isosorbide mononitrate (IMDUR) 30 MG 24 hr tablet Take 1 tablet (30 mg total) by mouth daily.  Marland Kitchen ketoconazole (NIZORAL) 2 % cream Apply 1 application topically 2 (two) times daily as needed (for skin irritation).   Marland Kitchen latanoprost (XALATAN) 0.005 % ophthalmic solution Place 1 drop into both eyes at bedtime.  Marland Kitchen LEVEMIR FLEXTOUCH 100 UNIT/ML Pen Inject 15-20 Units into the skin at bedtime. Patient states she takes every other day in the pm.  Patient takes 15-20 units if glucose is greater than 140 per patient  . lisinopril (PRINIVIL,ZESTRIL) 10 MG tablet Take 10 mg by mouth daily.   . methocarbamol (ROBAXIN) 500 MG tablet Take 1 tablet (500 mg total) by mouth every 8 (eight) hours as needed for muscle spasms.  . nitroGLYCERIN (NITROSTAT) 0.4 MG SL tablet Place 0.4 mg under the tongue every 5 (five) minutes as needed for chest pain.  Marland Kitchen NOVOLOG FLEXPEN 100 UNIT/ML FlexPen Inject 30-50 Units into the skin See admin instructions. 50 units every morning, 25 units at lunch and 50  units at supper *Hold for low blood sugar*  . Potassium Chloride ER 20 MEQ TBCR Take 20 mEq by mouth daily.  . rosuvastatin (CRESTOR) 20 MG tablet Take 1 tablet (20 mg total) by mouth daily at 6 PM. (Patient taking differently: Take 20 mg by mouth daily. )  . spironolactone (ALDACTONE) 25 MG tablet Take 25 mg by mouth daily.  . traMADol (ULTRAM) 50 MG tablet Take 50 mg by mouth 2 (two) times daily.  Alveda Reasons 20 MG TABS tablet Take 20 mg by mouth daily.      Allergies:   Lipitor  [atorvastatin calcium]; Metformin and related; Atorvastatin; and Levofloxacin   Social History   Socioeconomic History  .  Marital status: Legally Separated    Spouse name: None  . Number of children: 3  . Years of education: 88  . Highest education level: None  Social Needs  . Financial resource strain: None  . Food insecurity - worry: None  . Food insecurity - inability: None  . Transportation needs - medical: None  . Transportation needs - non-medical: None  Occupational History  . Occupation: Geophysical data processor: UNEMPLOYED  Tobacco Use  . Smoking status: Never Smoker  . Smokeless tobacco: Never Used  Substance and Sexual Activity  . Alcohol use: No  . Drug use: No  . Sexual activity: Not Currently  Other Topics Concern  . None  Social History Narrative   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   11/04/15 Lives with son    caffeine - coffee, 1-2 cups daily     Family History: The patient's family history includes Diabetes (age of onset: 37) in her mother; Other in her unknown relative; Stroke in her mother.  ROS:   Please see the history of present illness.     All other systems reviewed and are negative.  EKGs/Labs/Other Studies Reviewed:    The following studies were reviewed today: Hospital records, lab work, echocardiogram personally reviewed  EKG:  No change  Recent Labs: 01/18/2017: BUN 6; Creatinine, Ser 0.91; NT-Pro BNP 3,174; Potassium 4.6;  Sodium 140  Recent Lipid Panel    Component Value Date/Time   CHOL 90 12/22/2012 1042   TRIG 85 12/22/2012 1042   HDL 33 (L) 12/22/2012 1042   CHOLHDL 4.2 04/25/2012 0520   VLDL 23 04/25/2012 0520   LDLCALC 40 12/22/2012 1042    Physical Exam:    VS:  BP 124/78   Pulse 91   Ht 5\' 4"  (1.626 m)   Wt 248 lb 12.8 oz (112.9 kg)   SpO2 96%   BMI 42.71 kg/m     Wt Readings from Last 3 Encounters:  01/31/17 248 lb 12.8 oz (112.9 kg)  01/18/17 256 lb (116.1 kg)  10/12/16 256 lb (116.1 kg)     GEN: Obese in wheel chair Well nourished, well developed in no acute distress HEENT: Normal NECK: No JVD; No carotid bruits LYMPHATICS: No lymphadenopathy CARDIAC: RRR, no murmurs, rubs, gallops RESPIRATORY:  Clear to auscultation without rales, wheezing or rhonchi  ABDOMEN: Soft, non-tender, non-distended MUSCULOSKELETAL: Chronic lower extremity bilateral edema; No deformity  SKIN: Warm and dry NEUROLOGIC:  Alert and oriented x 3 PSYCHIATRIC:  Normal affect   ASSESSMENT:    1. Chronic systolic dysfunction of left ventricle   2. Ischemic cardiomyopathy   3. ICD (implantable cardioverter-defibrillator) in place   4. Coronary artery disease involving native coronary artery of native heart without angina pectoris   5. Diabetes mellitus with coincident hypertension ([place])   6. Morbid obesity ([place])    PLAN:    In order of problems listed above:  Chronic systolic heart failure -EF 40%-continuing with carvedilol 12.5 mg twice a day, Lasix 80 mg once a day, isosorbide 30 mg a day, spironolactone 12.5 mg a day, lisinopril 10 mg a day.  No changes made in medication.  Blood pressure is excellent.  Currently stable NYHA class II symptoms.  Coronary artery disease status post CABG -Subsequent cardiac catheterization showed patent grafts in 2011.  No angina.  Chronic DVT - Continuing with Xarelto 20 mg a day.  No bleeding.  Diabetes with hypertension - Per Dr. Pablo Lawrence  ICD -Per  Dr.  Rayann Heman.  LV lead nonfunctional.  Does not wish to go forward with epicardial lead placement.  6 month with [name], PA, one year with me  Medication Adjustments/Labs and Tests Ordered: Current medicines are reviewed at length with the patient today.  Concerns regarding medicines are outlined above.  No orders of the defined types were placed in this encounter.  No orders of the defined types were placed in this encounter.   Signed, Candee Furbish, MD  01/31/2017 11:20 AM    [place]

## 2017-01-31 NOTE — Patient Instructions (Signed)
Medication Instructions:  The current medical regimen is effective;  continue present plan and medications.  Follow-Up: Follow up in 6 months  with Scott Weaver, PA.  You will receive a letter in the mail 2 months before you are due.  Please call us when you receive this letter to schedule your follow up appointment.  Follow up in 1 year with Dr. Skains.  You will receive a letter in the mail 2 months before you are due.  Please call us when you receive this letter to schedule your follow up appointment.  If you need a refill on your cardiac medications before your next appointment, please call your pharmacy.  Thank you for choosing [facility]!!     

## 2017-02-06 ENCOUNTER — Other Ambulatory Visit: Payer: Self-pay | Admitting: Physician Assistant

## 2017-02-19 ENCOUNTER — Telehealth: Payer: Self-pay | Admitting: Cardiology

## 2017-02-19 ENCOUNTER — Ambulatory Visit (INDEPENDENT_AMBULATORY_CARE_PROVIDER_SITE_OTHER): Payer: Medicare HMO | Admitting: *Deleted

## 2017-02-19 DIAGNOSIS — Z95810 Presence of automatic (implantable) cardiac defibrillator: Secondary | ICD-10-CM

## 2017-02-19 DIAGNOSIS — I5022 Chronic systolic (congestive) heart failure: Secondary | ICD-10-CM | POA: Diagnosis not present

## 2017-02-19 DIAGNOSIS — I255 Ischemic cardiomyopathy: Secondary | ICD-10-CM

## 2017-02-19 NOTE — Telephone Encounter (Signed)
Spoke with pt and reminded pt of remote transmission that is due today. Pt verbalized understanding.   

## 2017-02-19 NOTE — Progress Notes (Signed)
ICD remote transmission received 

## 2017-02-19 NOTE — Progress Notes (Signed)
EPIC Encounter for ICM Monitoring  Patient Name: [name] is a 67 y.o. female Date: 02/19/2017 Primary Care Physican: Everardo Beals, NP Primary Cardiologist:Skains/Weaver PA Electrophysiologist: Allred Dry Weight: 247lbs           Heart Failure questions reviewed, pt asymptomatic.   Thoracic impedance normal.  Prescribed dosage: Furosemide 80 mg 1 tablet twice a day. Potassium 20 mEq 1 tablet daily.   Labs:  01/31/2016 Creatinine 1.09, BUN 17, Potassium 4.7, Sodium 138, EGFR 53-62 01/24/2016 Creatinine 1.35, BUN 15, Potassium 4.3, Sodium 140, EGFR 41-48 10/07/2015 Creatinine 1.20, BUN 17, Potassium 3.7, Sodium 135 10/06/2015 Creatinine 1.08, BUN 16, Potassium 3.9, Sodium 135 10/04/2015 Creatinine 0.91, BUN 12, Potassium 4.0, Sodium 137 10/03/2015 Creatinine 1.16, BUN 15, Potassium 4.4, Sodium 133 10/02/2015 Creatinine 1.27, BUN 15, Potassium 3.9, Sodium 137 10/01/2015 Creatinine 1.07, BUN 13, Potassium 3.5, Sodium 137 09/30/2015 Creatinine 0.98, BUN 13, Potassium 4.0, Sodium 138 09/20/2015 Creatinine 0.88, BUN 13, Potassium 3.9, Sodium 136 05/19/2015 Creatinine 1.11, BUN 16, Potassium 4.1, Sodium 138  Recommendations: No changes.    Encouraged to call for fluid symptoms.  Follow-up plan: ICM clinic phone appointment on 03/22/2017.    Copy of ICM check sent to Dr. Rayann Heman.   3 month ICM trend: 02/19/2017    1 Year ICM trend:       [name], RN 02/19/2017 5:02 PM

## 2017-02-20 ENCOUNTER — Encounter: Payer: Self-pay | Admitting: Cardiology

## 2017-02-22 LAB — CUP PACEART INCLINIC DEVICE CHECK
Battery Remaining Longevity: 62 mo
Brady Statistic RA Percent Paced: 0.39 %
HIGH POWER IMPEDANCE MEASURED VALUE: 39 Ohm
Implantable Lead Implant Date: 20080423
Implantable Lead Implant Date: 20080423
Implantable Lead Implant Date: 20140918
Implantable Lead Location: 753858
Implantable Lead Location: 753860
Implantable Lead Model: 6947
Lead Channel Impedance Value: 390 Ohm
Lead Channel Impedance Value: 440 Ohm
Lead Channel Pacing Threshold Amplitude: 0.5 V
Lead Channel Pacing Threshold Amplitude: 0.5 V
Lead Channel Pacing Threshold Pulse Width: 0.5 ms
Lead Channel Pacing Threshold Pulse Width: 0.5 ms
Lead Channel Sensing Intrinsic Amplitude: 12 mV
Lead Channel Setting Pacing Amplitude: 2 V
Lead Channel Setting Sensing Sensitivity: 0.5 mV
MDC IDC LEAD LOCATION: 753859
MDC IDC MSMT LEADCHNL RA SENSING INTR AMPL: 1.7 mV
MDC IDC PG IMPLANT DT: 20140918
MDC IDC PG SERIAL: 7070007
MDC IDC SESS DTM: 20190111183528
MDC IDC SET LEADCHNL RV PACING AMPLITUDE: 2.5 V
MDC IDC SET LEADCHNL RV PACING PULSEWIDTH: 0.5 ms
MDC IDC STAT BRADY RV PERCENT PACED: 0 %

## 2017-02-25 ENCOUNTER — Other Ambulatory Visit: Payer: Self-pay | Admitting: Physician Assistant

## 2017-02-26 ENCOUNTER — Other Ambulatory Visit: Payer: Self-pay | Admitting: *Deleted

## 2017-02-26 DIAGNOSIS — Z139 Encounter for screening, unspecified: Secondary | ICD-10-CM

## 2017-03-01 LAB — CUP PACEART REMOTE DEVICE CHECK
Date Time Interrogation Session: 20190222201210
Implantable Lead Implant Date: 20080423
Implantable Lead Implant Date: 20080423
Implantable Lead Location: 753858
Implantable Lead Location: 753859
Implantable Lead Location: 753860
Implantable Lead Model: 6947
Implantable Pulse Generator Implant Date: 20140918
MDC IDC LEAD IMPLANT DT: 20140918
MDC IDC PG SERIAL: 7070007

## 2017-03-22 ENCOUNTER — Ambulatory Visit (INDEPENDENT_AMBULATORY_CARE_PROVIDER_SITE_OTHER): Payer: Medicare HMO

## 2017-03-22 DIAGNOSIS — I5022 Chronic systolic (congestive) heart failure: Secondary | ICD-10-CM | POA: Diagnosis not present

## 2017-03-22 DIAGNOSIS — Z95810 Presence of automatic (implantable) cardiac defibrillator: Secondary | ICD-10-CM | POA: Diagnosis not present

## 2017-03-22 NOTE — Progress Notes (Signed)
EPIC Encounter for ICM Monitoring  Patient Name: [name] is a 67 y.o. female Date: 03/22/2017 Primary Care Physican: Everardo Beals, NP Primary Cardiologist:Skains/Weaver PA Electrophysiologist: Allred Dry Weight:248lbs       Heart Failure questions reviewed, pt chronic swelling of one leg.   Thoracic impedance normal.  Prescribed dosage: Furosemide 80 mg 1 tablet twice a day. Potassium 20 mEq 1 tablet daily.   Labs:  01/31/2016 Creatinine 1.09, BUN 17, Potassium 4.7, Sodium 138, EGFR 53-62 01/24/2016 Creatinine 1.35, BUN 15, Potassium 4.3, Sodium 140, EGFR 41-48 10/07/2015 Creatinine 1.20, BUN 17, Potassium 3.7, Sodium 135 10/06/2015 Creatinine 1.08, BUN 16, Potassium 3.9, Sodium 135 10/04/2015 Creatinine 0.91, BUN 12, Potassium 4.0, Sodium 137 10/03/2015 Creatinine 1.16, BUN 15, Potassium 4.4, Sodium 133 10/02/2015 Creatinine 1.27, BUN 15, Potassium 3.9, Sodium 137 10/01/2015 Creatinine 1.07, BUN 13, Potassium 3.5, Sodium 137 09/30/2015 Creatinine 0.98, BUN 13, Potassium 4.0, Sodium 138 09/20/2015 Creatinine 0.88, BUN 13, Potassium 3.9, Sodium 136 05/19/2015 Creatinine 1.11, BUN 16, Potassium 4.1, Sodium 138  Recommendations: No changes.   Encouraged to call for fluid symptoms.  Follow-up plan: ICM clinic phone appointment on 04/22/2017.   Copy of ICM check sent to Dr. Rayann Heman.   3 month ICM trend: 03/22/2017    1 Year ICM trend:       [name], RN 03/22/2017 9:20 AM

## 2017-03-25 IMAGING — DX DG CHEST 1V PORT
1 series · 1 of 1 positions shown · non-contrast
Comparison: June 04, 2014

CLINICAL DATA: Syncope and collapse

EXAM:
PORTABLE CHEST 1 VIEW

[chest ap]
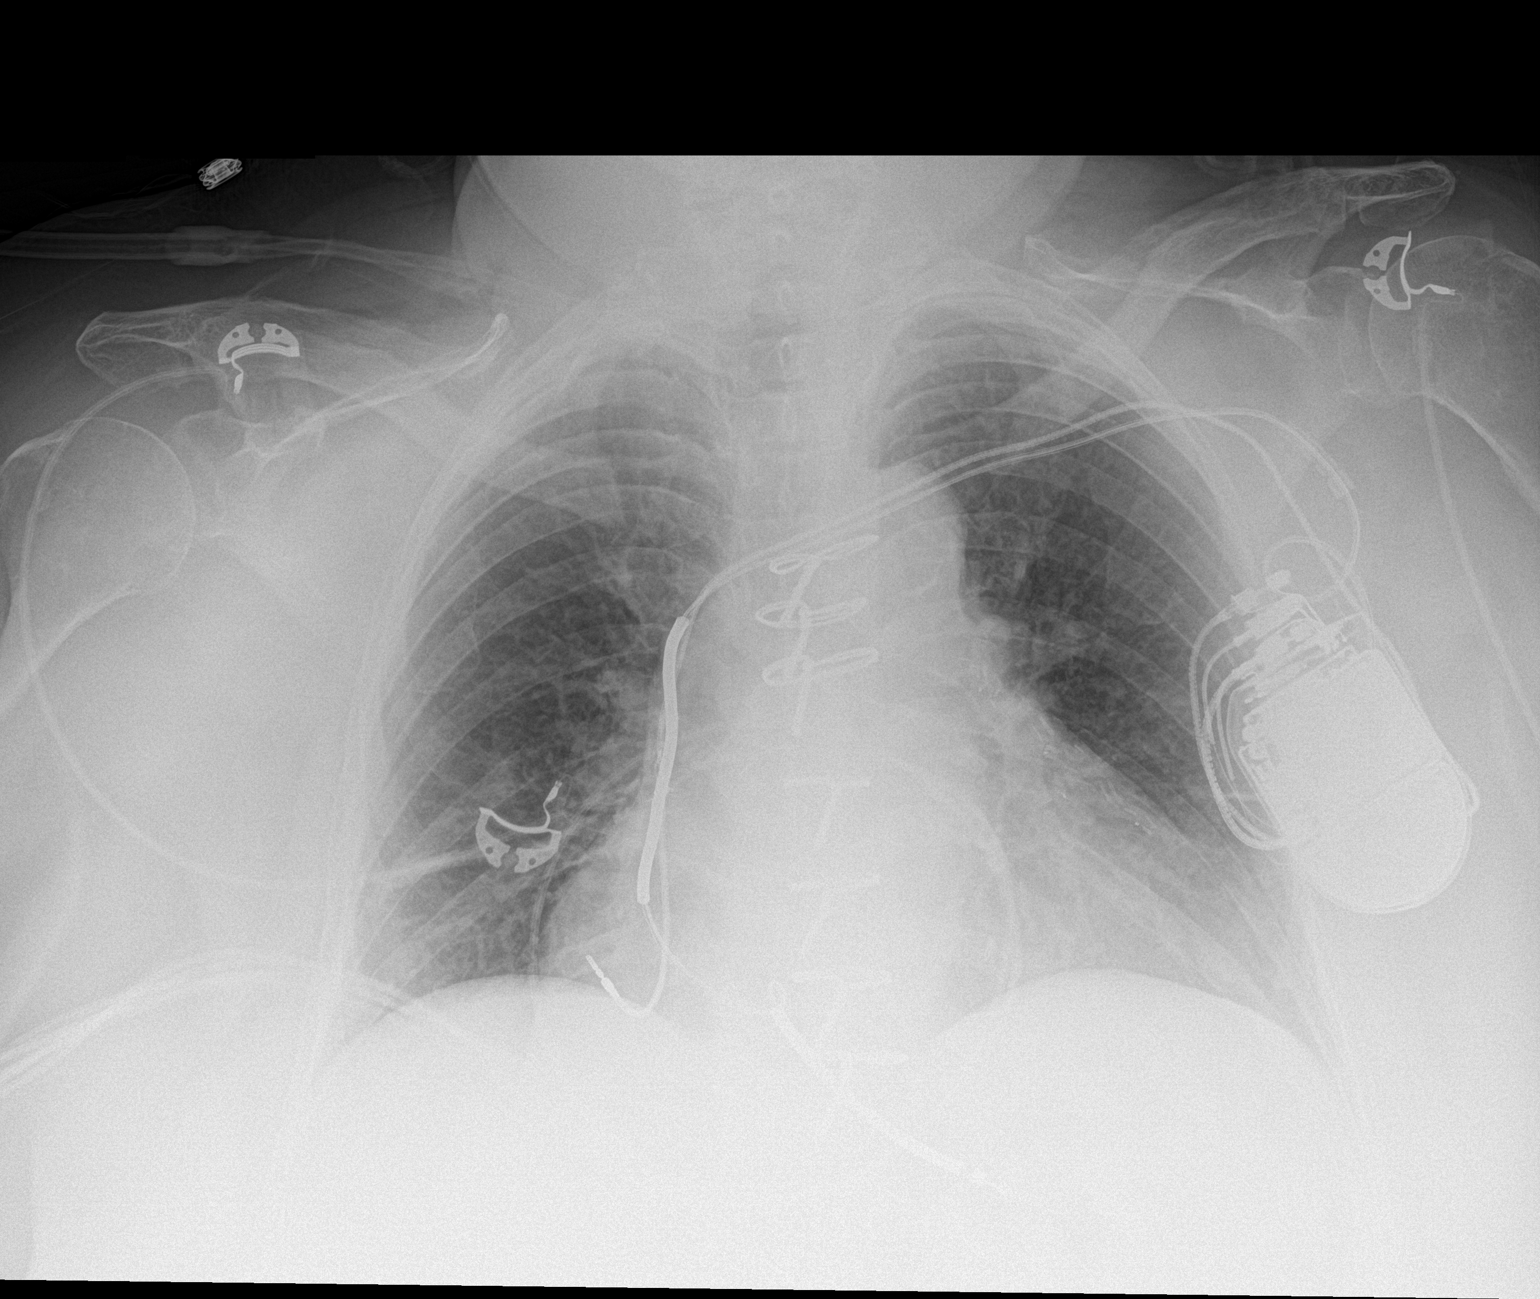

[1 of 1 positions shown; findings below may reference images not displayed]

FINDINGS: Cardiomegaly remains. No pneumothorax. A left AICD device is again
seen. No acute abnormalities otherwise noted.
IMPRESSION: No active disease.

## 2017-04-03 ENCOUNTER — Ambulatory Visit
Admission: RE | Admit: 2017-04-03 | Discharge: 2017-04-03 | Disposition: A | Discharge status: Home / Self Care | Payer: Medicare HMO | Source: Ambulatory Visit | Attending: *Deleted | Admitting: *Deleted

## 2017-04-03 DIAGNOSIS — Z139 Encounter for screening, unspecified: Secondary | ICD-10-CM

## 2017-04-22 ENCOUNTER — Ambulatory Visit (INDEPENDENT_AMBULATORY_CARE_PROVIDER_SITE_OTHER): Payer: Medicare HMO

## 2017-04-22 ENCOUNTER — Telehealth: Payer: Self-pay | Admitting: Cardiology

## 2017-04-22 DIAGNOSIS — Z95810 Presence of automatic (implantable) cardiac defibrillator: Secondary | ICD-10-CM

## 2017-04-22 DIAGNOSIS — I5022 Chronic systolic (congestive) heart failure: Secondary | ICD-10-CM | POA: Diagnosis not present

## 2017-04-22 NOTE — Telephone Encounter (Signed)
LMOVM reminding pt to send remote transmission.   

## 2017-04-23 NOTE — Progress Notes (Signed)
EPIC Encounter for ICM Monitoring  Patient Name: [name] is a 67 y.o. female Date: 04/23/2017 Primary Care Physican: Everardo Beals, NP Primary Cardiologist:Skains/Weaver PA Electrophysiologist: Allred Dry Weight:248 lbs          Heart Failure questions reviewed, pt asymptomatic.   Thoracic impedance normal but was abnormal suggesting fluid accumulation from 04/14/2017 to 04/20/2017.  Prescribed dosage: Furosemide 80 mg 1 tablet twice a day. Potassium 20 mEq 1 tablet daily.   Labs:  01/18/2017 Creatinine 0.91, BUN 6,   Potassium 4.6, Sodium 140, EGFR 66-76 01/31/2016 Creatinine 1.09, BUN 17, Potassium 4.7, Sodium 138, EGFR 53-62 01/24/2016 Creatinine 1.35, BUN 15, Potassium 4.3, Sodium 140, EGFR 41-48 10/07/2015 Creatinine 1.20, BUN 17, Potassium 3.7, Sodium 135 10/06/2015 Creatinine 1.08, BUN 16, Potassium 3.9, Sodium 135 10/04/2015 Creatinine 0.91, BUN 12, Potassium 4.0, Sodium 137 10/03/2015 Creatinine 1.16, BUN 15, Potassium 4.4, Sodium 133 10/02/2015 Creatinine 1.27, BUN 15, Potassium 3.9, Sodium 137 10/01/2015 Creatinine 1.07, BUN 13, Potassium 3.5, Sodium 137 09/30/2015 Creatinine 0.98, BUN 13, Potassium 4.0, Sodium 138 09/20/2015 Creatinine 0.88, BUN 13, Potassium 3.9, Sodium 136 05/19/2015 Creatinine 1.11, BUN 16, Potassium 4.1, Sodium 138   Recommendations: No changes.  Reinforced fluid restriction to < 2 L daily and sodium restriction to less than 2000 mg daily.  Encouraged to call for fluid symptoms.  Follow-up plan: ICM clinic phone appointment on 05/27/2017.    Copy of ICM check sent to Dr. Rayann Heman.   3 month ICM trend: 04/22/2017    1 Year ICM trend:       [name], RN 04/23/2017 1:20 PM

## 2017-05-27 ENCOUNTER — Telehealth: Payer: Self-pay | Admitting: Cardiology

## 2017-05-27 ENCOUNTER — Ambulatory Visit (INDEPENDENT_AMBULATORY_CARE_PROVIDER_SITE_OTHER): Payer: Medicare HMO | Admitting: *Deleted

## 2017-05-27 DIAGNOSIS — I255 Ischemic cardiomyopathy: Secondary | ICD-10-CM | POA: Diagnosis not present

## 2017-05-27 DIAGNOSIS — I5022 Chronic systolic (congestive) heart failure: Secondary | ICD-10-CM

## 2017-05-27 DIAGNOSIS — Z95810 Presence of automatic (implantable) cardiac defibrillator: Secondary | ICD-10-CM | POA: Diagnosis not present

## 2017-05-27 NOTE — Telephone Encounter (Signed)
Spoke with pt and reminded pt of remote transmission that is due today. Pt verbalized understanding.   

## 2017-05-28 ENCOUNTER — Encounter: Payer: Self-pay | Admitting: Cardiology

## 2017-05-28 LAB — CUP PACEART REMOTE DEVICE CHECK
Date Time Interrogation Session: 20190521155737
Implantable Lead Implant Date: 20080423
Implantable Lead Location: 753858
Implantable Lead Location: 753859
Implantable Lead Location: 753860
Implantable Lead Model: 6947
Implantable Pulse Generator Implant Date: 20140918
MDC IDC LEAD IMPLANT DT: 20080423
MDC IDC LEAD IMPLANT DT: 20140918
Pulse Gen Serial Number: 7070007

## 2017-05-28 NOTE — Progress Notes (Signed)
Call to patient. Advised [name], [place] ordered to increase Lasix to 120 mg in AM and 80 mg in PM x 3 days. Then resume Lasix 80 mg Twice daily.  She verbalized understanding.

## 2017-05-28 NOTE — Progress Notes (Signed)
EPIC Encounter for ICM Monitoring  Patient Name: [name] is a 66 y.o. female Date: 05/28/2017 Primary Care Physican: Everardo Beals, NP Primary Cardiologist:Skains/Weaver PA Electrophysiologist: Allred Dry Weight:249 lbs      Heart Failure questions reviewed, pt asymptomatic.   Thoracic impedance abnormal suggesting fluid accumulation since 05/25/2017.  Prescribed dosage:  Furosemide 80 mg 1 tablet twice a day. Potassium 20 mEq 1 tablet daily.   Labs:  01/18/2017 Creatinine 0.91, BUN 6,   Potassium 4.6, Sodium 140, EGFR 66-76 01/31/2016 Creatinine 1.09, BUN 17, Potassium 4.7, Sodium 138, EGFR 53-62 01/24/2016 Creatinine 1.35, BUN 15, Potassium 4.3, Sodium 140, EGFR 41-48 10/07/2015 Creatinine 1.20, BUN 17, Potassium 3.7, Sodium 135 10/06/2015 Creatinine 1.08, BUN 16, Potassium 3.9, Sodium 135 10/04/2015 Creatinine 0.91, BUN 12, Potassium 4.0, Sodium 137 10/03/2015 Creatinine 1.16, BUN 15, Potassium 4.4, Sodium 133 10/02/2015 Creatinine 1.27, BUN 15, Potassium 3.9, Sodium 137 10/01/2015 Creatinine 1.07, BUN 13, Potassium 3.5, Sodium 137 09/30/2015 Creatinine 0.98, BUN 13, Potassium 4.0, Sodium 138 09/20/2015 Creatinine 0.88, BUN 13, Potassium 3.9, Sodium 136 05/19/2015 Creatinine 1.11, BUN 16, Potassium 4.1, Sodium 138  Recommendations:   Reinforced fluid restriction to < 2 L daily and sodium restriction to less than 2000 mg daily. Advised will call back if any recommendations are made.   Follow-up plan: ICM clinic phone appointment on 06/06/2017 to recheck fluid levels.    Copy of ICM check sent to Dr. Marlou Porch, [name], PA and Dr. Rayann Heman.   3 month ICM trend: 05/27/2017    1 Year ICM trend:       [name], RN 05/28/2017 3:09 PM

## 2017-05-28 NOTE — Progress Notes (Signed)
Attempted call to patient to advise of [name], PA recommendations.  Left message for return call.

## 2017-05-28 NOTE — Progress Notes (Signed)
Remote ICD transmission.   

## 2017-05-28 NOTE — Progress Notes (Signed)
Increase Lasix to 120 mg in AM and 80 mg in PM x 3 days. Then resume Lasix 80 mg Twice daily  ICM follow up as planned 06/06/17. [name], PA-C    05/28/2017 4:53 PM

## 2017-06-13 NOTE — Progress Notes (Signed)
No ICM remote transmission received for 06/06/2017 and next ICM transmission scheduled for 06/27/2017.

## 2017-06-27 ENCOUNTER — Telehealth: Payer: Self-pay | Admitting: Cardiology

## 2017-06-27 ENCOUNTER — Ambulatory Visit (INDEPENDENT_AMBULATORY_CARE_PROVIDER_SITE_OTHER): Payer: Medicare HMO

## 2017-06-27 DIAGNOSIS — I5022 Chronic systolic (congestive) heart failure: Secondary | ICD-10-CM

## 2017-06-27 DIAGNOSIS — Z95810 Presence of automatic (implantable) cardiac defibrillator: Secondary | ICD-10-CM | POA: Diagnosis not present

## 2017-06-27 NOTE — Telephone Encounter (Signed)
Spoke with pt and reminded pt of remote transmission that is due today. Pt verbalized understanding.   

## 2017-06-28 NOTE — Progress Notes (Signed)
EPIC Encounter for ICM Monitoring  Patient Name: KENNIYAH SASAKI is a 67 y.o. female Date: 06/28/2017 Primary Care Physican: Everardo Beals, NP Primary Cardiologist:Skains/Weaver PA Electrophysiologist: Allred Dry Weight: 250 lbs  Heart Failure questions reviewed, pt asymptomatic.   Thoracic impedance normal.  Prescribed dosage: Furosemide 80 mg 1 tablet twice a day. Potassium 20 mEq 1 tablet daily.   Labs:  01/18/2017 Creatinine 0.91, BUN 6, Potassium 4.6, Sodium 140, EGFR 66-76 01/31/2016 Creatinine 1.09, BUN 17, Potassium 4.7, Sodium 138, EGFR 53-62 01/24/2016 Creatinine 1.35, BUN 15, Potassium 4.3, Sodium 140, EGFR 41-48  Recommendations: No changes.   Encouraged to call for fluid symptoms.  Follow-up plan: ICM clinic phone appointment on 07/29/2017.  Office appointment scheduled 07/30/2017 with [name], PA.   Copy of ICM check sent to Dr. Rayann Heman.   3 month ICM trend: 06/27/2017   1 Year ICM trend:       [name], RN 06/28/2017 8:41 AM

## 2017-07-15 ENCOUNTER — Encounter: Payer: Self-pay | Admitting: Physician Assistant

## 2017-07-26 ENCOUNTER — Telehealth: Payer: Self-pay

## 2017-07-26 NOTE — Telephone Encounter (Signed)
Spoke with pt and reminded pt of remote transmission that is due today. Pt verbalized understanding.   

## 2017-07-29 ENCOUNTER — Telehealth: Payer: Self-pay

## 2017-07-29 ENCOUNTER — Ambulatory Visit (INDEPENDENT_AMBULATORY_CARE_PROVIDER_SITE_OTHER): Payer: Medicare HMO

## 2017-07-29 DIAGNOSIS — Z95810 Presence of automatic (implantable) cardiac defibrillator: Secondary | ICD-10-CM | POA: Diagnosis not present

## 2017-07-29 DIAGNOSIS — I5022 Chronic systolic (congestive) heart failure: Secondary | ICD-10-CM

## 2017-07-29 NOTE — Telephone Encounter (Signed)
Spoke with pt and reminded pt of remote transmission that is due today. Pt verbalized understanding.   

## 2017-07-29 NOTE — Progress Notes (Signed)
[address] [place], [place]    07/29/2017 5:41 PM

## 2017-07-29 NOTE — Progress Notes (Signed)
EPIC Encounter for ICM Monitoring  Patient Name: [name] is a 67 y.o. female Date: 07/29/2017 Primary Care Physican: Everardo Beals, NP Primary Cardiologist:Skains/Weaver PA Electrophysiologist: Allred Dry Weight: 250lbs      Heart Failure questions reviewed, pt asymptomatic.   Thoracic impedance normal but was abnormal suggesting fluid accumulation from 06/27/2017 - 07/07/2017 and 07/19/2017 and 07/25/2017.  Prescribed dosage: Furosemide 80 mg 1 tablet twice a day. Potassium 20 mEq 1 tablet daily.   Labs:  01/18/2017 Creatinine 0.91, BUN 6, Potassium 4.6, Sodium 140, EGFR 66-76 01/31/2016 Creatinine 1.09, BUN 17, Potassium 4.7, Sodium 138, EGFR 53-62 01/24/2016 Creatinine 1.35, BUN 15, Potassium 4.3, Sodium 140, EGFR 41-48  Recommendations: No changes.    Encouraged to call for fluid symptoms.  Follow-up plan: ICM clinic phone appointment on 08/29/2017.   Office appointment scheduled 07/30/2017 with [name], PA.    Copy of ICM check sent to Dr. Rayann Heman and [name], PA (patient has office appt 7/23).   3 month ICM trend: 07/29/2017    1 Year ICM trend:       [name], RN 07/29/2017 1:37 PM

## 2017-07-30 ENCOUNTER — Ambulatory Visit (INDEPENDENT_AMBULATORY_CARE_PROVIDER_SITE_OTHER): Payer: Medicare HMO | Admitting: Physician Assistant

## 2017-07-30 ENCOUNTER — Ambulatory Visit: Payer: Medicare HMO | Admitting: Physician Assistant

## 2017-07-30 ENCOUNTER — Encounter: Payer: Self-pay | Admitting: Physician Assistant

## 2017-07-30 VITALS — BP 136/62 | HR 64 | Ht 64.0 in | Wt 259.1 lb

## 2017-07-30 DIAGNOSIS — I10 Essential (primary) hypertension: Secondary | ICD-10-CM

## 2017-07-30 DIAGNOSIS — Z95810 Presence of automatic (implantable) cardiac defibrillator: Secondary | ICD-10-CM

## 2017-07-30 DIAGNOSIS — I5042 Chronic combined systolic (congestive) and diastolic (congestive) heart failure: Secondary | ICD-10-CM

## 2017-07-30 DIAGNOSIS — E785 Hyperlipidemia, unspecified: Secondary | ICD-10-CM

## 2017-07-30 DIAGNOSIS — I2510 Atherosclerotic heart disease of native coronary artery without angina pectoris: Secondary | ICD-10-CM | POA: Diagnosis not present

## 2017-07-30 DIAGNOSIS — Z86718 Personal history of other venous thrombosis and embolism: Secondary | ICD-10-CM | POA: Diagnosis not present

## 2017-07-30 NOTE — Patient Instructions (Signed)
Medication Instructions:  1. Your physician recommends that you continue on your current medications as directed. Please refer to the Current Medication list given to you today.   Labwork: FASTING LAB WORK TO BE DONE; BMET, LIPIDS AND LIVER PANEL  Testing/Procedures: NONE ORDERED TODAY  Follow-Up: DR. Marlou Porch IN 6 MONTHS   Any Other Special Instructions Will Be Listed Below (If Applicable).     If you need a refill on your cardiac medications before your next appointment, please call your pharmacy.

## 2017-07-30 NOTE — Progress Notes (Signed)
Cardiology Office Note:    Date:  07/30/2017   ID:  [name], DOB August 18, 1950, MRN [id number]  PCP:  Everardo Beals, NP  Cardiologist:  Candee Furbish, MD  Electrophysiologist:  Thompson Grayer, MD    Referring MD: Everardo Beals, NP   Chief Complaint  Patient presents with  . Follow-up    CHF, CAD    History of Present Illness:    [name] is a 67 y.o. female with CAD s/p CABG 3419, ICM, systolic HF, VTach, s/p ICD (CRT was unsuccessful due to vein slcerosis), OSA, DM2, HTN. She is followed in the [facility]. Admitted in 9/17 with extensive bilateral LE DVT.  She underwent thrombolysis with intravascular tPA and stenting to the L common/exernal iliac vein.    She is on Rivaroxaban for anticoagulation.  She was last seen by Dr. Marlou Porch in January 2019.  Interrogation of her device yesterday demonstrated an episode of fluid accumulation in June as well as mid July.  Her thoracic impedance was back to normal yesterday.    [name] returns for follow-up.  She is here alone.  She denies significant changes in her shortness of breath.  She wears oxygen as needed.  She denies chest discomfort, PND.  She has chronic lower extremity swelling that is controlled with lower extremity compression stockings.  She denies syncope.  She denies any bleeding issues.  Prior CV studies:   The following studies were reviewed today:  Echo 03/13/15 Mild to mod LVH, EF 40-45%, no RWMA, Gr 1 DD   Myoview 5/16 Findings consistent with prior myocardial infarction. This is a high risk study. The left ventricular ejection fraction is severely decreased (<30%). Defect 1: There is a large defect of severe severity present in the mid inferoseptal and apex location.   [place] 12/11 EF 30-35% L-D2 patent S-RI patent S-OM1/OM2 patent S-PDA patent  Past Medical History:  Diagnosis Date  . AICD (automatic cardioverter/defibrillator) present   . Angina decubitus ([place]) 11/06/2012  . Angina pectoris  ([place])   . Anxiety   . Arthritis   . Bundle branch block 05/2016  . CHF (congestive heart failure) ([place])   . Chronic combined systolic and diastolic CHF, NYHA class 3 ([place])   . Coronary artery disease    s/p CABG 2007  . Depression   . Dermal mycosis   . Diabetes mellitus    type II  . DVT (deep venous thrombosis) ([place]) 09/2015   bilateral  . Hyperlipidemia   . Hypertension   . Insomnia   . Ischemic cardiomyopathy    EF 30-35%, s/p ICD 4/08 by Dr Leonia Reeves  . Morbid obesity ([place]) 11/06/2012  . Obesity   . Oxygen dependent 2 L  . Peripheral neuropathy   . 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 Sleep apnea    uses O2 at night  . Tinea   . Ventricular tachycardia ([place]) 01/16/15   sustained VT terminated with ATP, CL 340 msec   Surgical Hx: The patient  has a past surgical history that includes Cardiac catheterization; Cardiac defibrillator placement (4/08); Coronary artery bypass graft (10/05/2005); Implantable cardioverter defibrillator implant (09/25/12); bi-ventricular implantable cardioverter defibrillator upgrade (N/A, 09/25/2012); ir generic historical (10/03/2015); ir generic historical (10/03/2015); ir generic historical (10/03/2015); ir generic historical (10/03/2015); ir generic historical (10/03/2015); ir generic historical (10/04/2015); ir generic historical (10/04/2015); ir generic historical (  10/04/2015); ir generic historical (11/02/2015); Breast biopsy (Right, 04/05/2014); IR Radiologist Eval & Mgmt (04/26/2016); and Colonoscopy with propofol (N/A, 10/12/2016).   Current Medications: Current Meds  Medication Sig  . albuterol (PROVENTIL HFA;VENTOLIN HFA) 108 (90 BASE) MCG/ACT inhaler Inhale 2 puffs into the lungs every 6 (six) hours as needed for wheezing or shortness of breath.  [place] Kitchen albuterol (PROVENTIL) (2.5 MG/3ML) 0.083% nebulizer solution Take 2.5 mg by nebulization every 6 (six) hours as  needed for wheezing or shortness of breath.   [place] Kitchen amitriptyline (ELAVIL) 10 MG tablet Take 10 mg by mouth daily.  . carvedilol (COREG) 12.5 MG tablet Take 12.5 mg by mouth 2 (two) times daily with a meal.   . ferrous sulfate 300 (60 Fe) MG/5ML syrup Take 300 mg by mouth daily with lunch.  . furosemide (LASIX) 80 MG tablet TAKE 1 TABLET TWICE DAILY  . glipiZIDE (GLUCOTROL) 5 MG tablet Take 5 mg by mouth daily before breakfast.   . ibuprofen (ADVIL,MOTRIN) 200 MG tablet Take 200 mg by mouth every 8 (eight) hours as needed (for pain).  . isosorbide mononitrate (IMDUR) 30 MG 24 hr tablet TAKE 1 TABLET EVERY DAY  . ketoconazole (NIZORAL) 2 % cream Apply 1 application topically 2 (two) times daily as needed (for skin irritation).   [place] Kitchen latanoprost (XALATAN) 0.005 % ophthalmic solution Place 1 drop into both eyes at bedtime.  Mar[place] Kitchen LEVEMIR FLEXTOUCH 100 UNIT/ML Pen Inject 15-20 Units into the skin at bedtime. Patient states she takes every other day in the pm.  Patient takes 15-20 units if glucose is greater than 140 per patient  . lisinopril (PRINIVIL,ZESTRIL) 10 MG tablet Take 10 mg by mouth daily.   . methocarbamol (ROBAXIN) 500 MG tablet Take 1 tablet (500 mg total) by mouth every 8 (eight) hours as needed for muscle spasms.  . nitroGLYCERIN (NITROSTAT) 0.4 MG SL tablet Place 0.4 mg under the tongue every 5 (five) minutes as needed for chest pain.  [place] Kitchen NOVOLOG FLEXPEN 100 UNIT/ML FlexPen Inject 30-50 Units into the skin See admin instructions. 50 units every morning, 25 units at lunch and 50 units at supper *Hold for low blood sugar*  . Potassium Chloride ER 20 MEQ TBCR Take 20 mEq by mouth daily.  . rosuvastatin (CRESTOR) 20 MG tablet Take 1 tablet (20 mg total) by mouth daily at 6 PM. (Patient taking differently: Take 20 mg by mouth daily. )  . spironolactone (ALDACTONE) 25 MG tablet Take 25 mg by mouth daily.  . traMADol (ULTRAM) 50 MG tablet Take 50 mg by mouth 2 (two) times daily.  Alveda Reasons 20 MG TABS  tablet Take 20 mg by mouth daily.      Allergies:   Lipitor  [atorvastatin calcium]; Metformin and related; Atorvastatin; and Levofloxacin   Social History   Tobacco Use  . Smoking status: Never Smoker  . Smokeless tobacco: Never Used  Substance Use Topics  . Alcohol use: No  . Drug use: No     Family Hx: The patient's family history includes Diabetes (age of onset: 72) in her mother; Other in her unknown relative; Stroke in her mother.  ROS:   Please see the history of present illness.    ROS All other systems reviewed and are negative.   EKGs/Labs/Other Test Reviewed:    EKG:  EKG is not ordered today.    Recent Labs: 01/18/2017: BUN 6; Creatinine, Ser 0.91; NT-Pro BNP 3,174; Potassium 4.6; Sodium 140   Recent Lipid Panel Lab Results  Component  Value Date/Time   CHOL 90 12/22/2012 10:42 AM   TRIG 85 12/22/2012 10:42 AM   HDL 33 (L) 12/22/2012 10:42 AM   CHOLHDL 4.2 04/25/2012 05:20 AM   LDLCALC 40 12/22/2012 10:42 AM    Physical Exam:    VS:  BP 136/62   Pulse 64   Ht 5\' 4"  (1.626 m)   Wt 259 lb 1.9 oz (117.5 kg)   SpO2 96%   BMI 44.48 kg/m     Wt Readings from Last 3 Encounters:  07/30/17 259 lb 1.9 oz (117.5 kg)  01/31/17 248 lb 12.8 oz (112.9 kg)  01/18/17 256 lb (116.1 kg)     Physical Exam  Constitutional: She is oriented to person, place, and time. She appears well-developed and well-nourished. No distress.  HENT:  Head: Normocephalic and atraumatic.  Neck: Neck supple.  Cardiovascular: Normal rate, regular rhythm, S1 normal, S2 normal and normal heart sounds.  No murmur heard. Pulmonary/Chest: Effort normal. She has no rales.  Abdominal: Soft.  Musculoskeletal: She exhibits edema (trace-1+ bilat LE edema; compression stockings in place).  Neurological: She is alert and oriented to person, place, and time.  Skin: Skin is warm and dry.  Psychiatric: She has a normal mood and affect.    ASSESSMENT & PLAN:    Chronic combined systolic and  diastolic heart failure (HCC) EF 40-45 by echocardiogram in 2017.  She is NYHA 3a.  She recently had noted a change in thoracic impedance indicating volume excess.  However, most recent thoracic impedance was back to normal.  Her volume appears normal today.  I have encouraged her to continue to weigh daily and take extra Lasix 40 mg if her weight increases 3 pounds in 1 day or 5 pounds 1 week.  Continue current dose of carvedilol, isosorbide, lisinopril, spironolactone.  Arrange follow-up BMET.  Coronary artery disease involving native coronary artery of native heart without angina pectoris  Prior history of CABG.  Nuclear stress test in 2016 with inferoseptal and apical scar.  She denies anginal symptoms.  She is not on aspirin as she is on long-term anticoagulation with Rivaroxaban.  Continue beta-blocker, nitrates, statin.  Essential hypertension  The patient's blood pressure is controlled on her current regimen.  Continue current therapy.   History of DVT (deep vein thrombosis) She is on long-term anticoagulation with Rivaroxaban.  This is managed by her primary care provider.  ICD (implantable cardioverter-defibrillator) in place Continue follow-up with EP as planned.  Hyperlipidemia, unspecified hyperlipidemia type Arrange follow-up fasting lipids and LFTs.   Dispo:  Return in about 6 months (around 01/30/2018) for Routine Follow Up with Dr. Marlou Porch.   Medication Adjustments/Labs and Tests Ordered: Current medicines are reviewed at length with the patient today.  Concerns regarding medicines are outlined above.  Tests Ordered: Orders Placed This Encounter  Procedures  . Lipid Profile  . Hepatic function panel  . Basic metabolic panel   Medication Changes: No orders of the defined types were placed in this encounter.   Signed, [name], PA-C  07/30/2017 1:07 PM    [facility] [address], [place], [place]  [address] Phone: [phone]; Fax:  [phone]

## 2017-08-05 ENCOUNTER — Other Ambulatory Visit: Payer: Medicare HMO | Admitting: *Deleted

## 2017-08-05 DIAGNOSIS — I2510 Atherosclerotic heart disease of native coronary artery without angina pectoris: Secondary | ICD-10-CM

## 2017-08-05 DIAGNOSIS — I10 Essential (primary) hypertension: Secondary | ICD-10-CM

## 2017-08-05 LAB — LIPID PANEL
Chol/HDL Ratio: 3.6 ratio (ref 0.0–4.4)
Cholesterol, Total: 116 mg/dL (ref 100–199)
HDL: 32 mg/dL — AB (ref >39)
LDL Calculated: 65 mg/dL (ref 0–99)
TRIGLYCERIDES: 94 mg/dL (ref 0–149)
VLDL CHOLESTEROL CAL: 19 mg/dL (ref 5–40)

## 2017-08-05 LAB — HEPATIC FUNCTION PANEL
ALBUMIN: 4.1 g/dL (ref 3.6–4.8)
ALT: 21 IU/L (ref 0–32)
AST: 19 IU/L (ref 0–40)
Alkaline Phosphatase: 91 IU/L (ref 39–117)
BILIRUBIN TOTAL: 0.7 mg/dL (ref 0.0–1.2)
BILIRUBIN, DIRECT: 0.19 mg/dL (ref 0.00–0.40)
Total Protein: 7.5 g/dL (ref 6.0–8.5)

## 2017-08-05 LAB — BASIC METABOLIC PANEL
BUN/Creatinine Ratio: 20 (ref 12–28)
BUN: 22 mg/dL (ref 8–27)
CALCIUM: 9.6 mg/dL (ref 8.7–10.3)
CHLORIDE: 102 mmol/L (ref 96–106)
CO2: 23 mmol/L (ref 20–29)
Creatinine, Ser: 1.12 mg/dL — ABNORMAL HIGH (ref 0.57–1.00)
GFR calc non Af Amer: 51 mL/min/{1.73_m2} — ABNORMAL LOW (ref >59)
GFR, EST AFRICAN AMERICAN: 59 mL/min/{1.73_m2} — AB (ref >59)
Glucose: 152 mg/dL — ABNORMAL HIGH (ref 65–99)
Potassium: 4.2 mmol/L (ref 3.5–5.2)
Sodium: 140 mmol/L (ref 134–144)

## 2017-08-08 ENCOUNTER — Telehealth: Payer: Self-pay | Admitting: Physician Assistant

## 2017-08-08 NOTE — Telephone Encounter (Signed)
I returned pt's call who asked if she could have a copy of her recent lab results mailed to her. I have verified her address by phone and will place lab results in the mail to the pt.

## 2017-08-08 NOTE — Telephone Encounter (Signed)
Follow Up:    Returning your call from 08-06-17, concerning her lab results.

## 2017-08-29 ENCOUNTER — Telehealth: Payer: Self-pay

## 2017-08-29 ENCOUNTER — Ambulatory Visit (INDEPENDENT_AMBULATORY_CARE_PROVIDER_SITE_OTHER): Payer: Medicare HMO

## 2017-08-29 ENCOUNTER — Ambulatory Visit (INDEPENDENT_AMBULATORY_CARE_PROVIDER_SITE_OTHER): Payer: Medicare HMO | Admitting: *Deleted

## 2017-08-29 DIAGNOSIS — Z95810 Presence of automatic (implantable) cardiac defibrillator: Secondary | ICD-10-CM | POA: Diagnosis not present

## 2017-08-29 DIAGNOSIS — I255 Ischemic cardiomyopathy: Secondary | ICD-10-CM | POA: Diagnosis not present

## 2017-08-29 DIAGNOSIS — I5042 Chronic combined systolic (congestive) and diastolic (congestive) heart failure: Secondary | ICD-10-CM | POA: Diagnosis not present

## 2017-08-29 NOTE — Telephone Encounter (Signed)
Spoke with pt and reminded pt of remote transmission that is due today. Pt verbalized understanding.   

## 2017-08-30 ENCOUNTER — Encounter: Payer: Self-pay | Admitting: Cardiology

## 2017-08-30 NOTE — Progress Notes (Signed)
EPIC Encounter for ICM Monitoring  Patient Name: [name] is a 67 y.o. female Date: 08/30/2017 Primary Care Physican: Everardo Beals, NP Primary Cardiologist:Skains/Weaver PA Electrophysiologist: Allred Dry Weight: 254lbs       Heart Failure questions reviewed, pt weight increased by couple of pounds.   Thoracic impedance starting downward trend on 08/28/2017 suggesting fluid accumulation.  Prescribed dosage: Furosemide 80 mg 1 tablet twice a day. Potassium 20 mEq 1 tablet daily. Per 07/30/17 office note from [place], [place], she can take extra Lasix 40 mg if her weight increases 3 pounds in 1 day or 5 pounds 1 week.   Labs:  08/05/2017 Creatinine 1.12, BUN 22, Potassium 4.2, Sodium 140, EGFR 51-59 01/18/2017 Creatinine 0.91, BUN 6, Potassium 4.6, Sodium 140, EGFR 66-76 01/31/2016 Creatinine 1.09, BUN 17, Potassium 4.7, Sodium 138, EGFR 53-62 01/24/2016 Creatinine 1.35, BUN 15, Potassium 4.3, Sodium 140, EGFR 41-48  Recommendations: Advised to take extra Lasix 40 mg x 1 day.  Follow-up plan: ICM clinic phone appointment on 09/02/2017 (manual send) to recheck fluid levels.       Copy of ICM check sent to Dr. Rayann Heman.   3 month ICM trend: 08/29/2017    1 Year ICM trend:       [name], RN 08/30/2017 12:22 PM

## 2017-08-30 NOTE — Progress Notes (Signed)
Remote ICD transmission.   

## 2017-09-04 NOTE — Progress Notes (Signed)
No ICM remote transmission received for 09/02/2017 and next ICM transmission scheduled for 10/03/2017.    

## 2017-10-03 ENCOUNTER — Telehealth: Payer: Self-pay

## 2017-10-03 ENCOUNTER — Ambulatory Visit (INDEPENDENT_AMBULATORY_CARE_PROVIDER_SITE_OTHER): Payer: Medicare HMO

## 2017-10-03 DIAGNOSIS — Z95810 Presence of automatic (implantable) cardiac defibrillator: Secondary | ICD-10-CM | POA: Diagnosis not present

## 2017-10-03 DIAGNOSIS — I5042 Chronic combined systolic (congestive) and diastolic (congestive) heart failure: Secondary | ICD-10-CM

## 2017-10-03 NOTE — Telephone Encounter (Signed)
Spoke with pt and reminded pt of remote transmission that is due today. Pt verbalized understanding.   

## 2017-10-04 LAB — CUP PACEART REMOTE DEVICE CHECK
Battery Remaining Percentage: 48 %
Date Time Interrogation Session: 20190927183851
HIGH POWER IMPEDANCE MEASURED VALUE: 37 Ohm
Implantable Lead Implant Date: 20140918
Implantable Lead Location: 753859
Implantable Lead Model: 6947
Implantable Pulse Generator Implant Date: 20140918
Lead Channel Impedance Value: 390 Ohm
Lead Channel Impedance Value: 480 Ohm
Lead Channel Sensing Intrinsic Amplitude: 1.5 mV
Lead Channel Setting Pacing Amplitude: 2.5 V
MDC IDC LEAD IMPLANT DT: 20080423
MDC IDC LEAD IMPLANT DT: 20080423
MDC IDC LEAD LOCATION: 753858
MDC IDC LEAD LOCATION: 753860
MDC IDC MSMT BATTERY REMAINING LONGEVITY: 45 mo
MDC IDC MSMT LEADCHNL RV SENSING INTR AMPL: 12 mV — AB
MDC IDC SET LEADCHNL RA PACING AMPLITUDE: 2 V
MDC IDC SET LEADCHNL RV PACING PULSEWIDTH: 0.5 ms
MDC IDC SET LEADCHNL RV SENSING SENSITIVITY: 0.5 mV
MDC IDC STAT BRADY RA PERCENT PACED: 1 % — AB
MDC IDC STAT BRADY RV PERCENT PACED: 1 % — AB
Pulse Gen Serial Number: 7070007

## 2017-10-04 NOTE — Progress Notes (Signed)
EPIC Encounter for ICM Monitoring  Patient Name: [name] is a 67 y.o. female Date: 10/04/2017 Primary Care Physican: [place], Kimberly, NP Primary Cardiologist:Skains/Weaver PA Electrophysiologist: Allred Dry Weight: 254lbs          Heart Failure questions reviewed, pt asymptomatic.   Thoracic impedance normal but was abnormal suggesting fluid accumulation from 08/28/2017 - 09/16/2017.  Prescribed: Furosemide 80 mg 1 tablet twice a day. Potassium 20 mEq 1 tablet daily. Per 07/30/17 office note from Scott Weaver, [place], she can take extra Lasix 40 mg if her weight increases 3 pounds in 1 day or 5 pounds 1 week.   Labs:  08/05/2017 Creatinine 1.12, BUN 22, Potassium 4.2, Sodium 140, EGFR 51-59 01/18/2017 Creatinine 0.91, BUN 6, Potassium 4.6, Sodium 140, EGFR 66-76 01/31/2016 Creatinine 1.09, BUN 17, Potassium 4.7, Sodium 138, EGFR 53-62 01/24/2016 Creatinine 1.35, BUN 15, Potassium 4.3, Sodium 140, EGFR 41-48  Recommendations: No changes.    Encouraged to call for fluid symptoms.  Follow-up plan: ICM clinic phone appointment on 11/04/2017.       Copy of ICM check sent to Dr. Allred.   3 month ICM trend: 10/03/2017    1 Year ICM trend:        S , RN 10/04/2017 12:14 PM   

## 2017-10-16 IMAGING — US IR US GUIDE VASC ACCESS LEFT
1 series · 4 of 4 positions shown · non-contrast
Comparison: None.

INDICATION: 65-year-old female with a 3 month history of left lower extremity
pain and swelling. She was recently diagnosed with extensive
bilateral lower extremity DVT. However, only the left lower
extremity is symptomatic. She has not experienced relief despite
several days of heparinization. She presents for catheter directed
thrombolysis.
TECHNIQUE: Informed written consent was obtained from the patient after a
thorough discussion of the procedural risks, benefits and
alternatives. All questions were addressed. Maximal Sterile Barrier
Technique was utilized including caps, mask, sterile gowns, sterile
gloves, sterile drape, hand hygiene and skin antiseptic. A timeout
was performed prior to the initiation of the procedure.

[Series 1: ir (id) (id)/(id)/(id) ir · 4 of 4 slices shown]
[im 1/4]
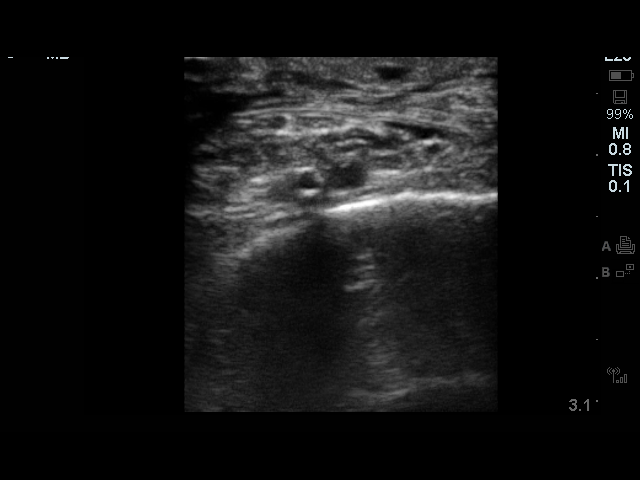
[im 2/4]
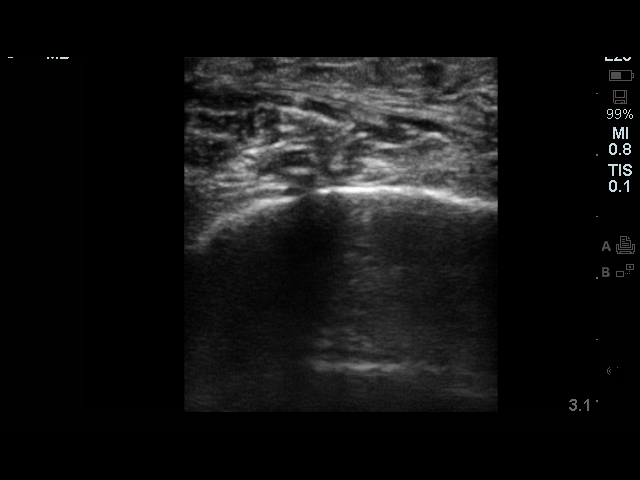
[im 3/4]
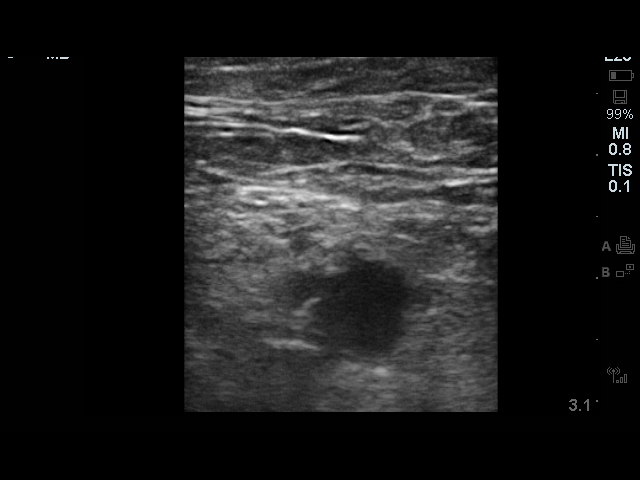
[im 4/4]
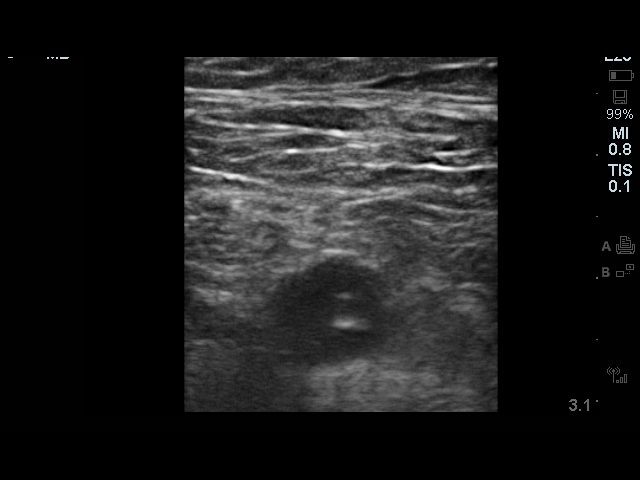

[4 of 4 positions shown; findings below may reference images not displayed]

EXAM:
IR INFUSION THROMBOL VENOUS INITIAL (MS); IR ULTRASOUND GUIDANCE
VASC ACCESS LEFT; LEFT EXTREMITY VENOGRAPHY; INFERIOR VENA CAVOGRAM

1. Ultrasound-guided access anterior tibial vein
2. Left lower extremity venogram
3. Ultrasound-guided access popliteal vein
4. Inferior vena cavagram
5. Initiation of venous thrombolysis
MEDICATIONS:
None.

ANESTHESIA/SEDATION:
Versed 1.5 mg IV; Fentanyl 75 mcg IV

Moderate Sedation Time:  42 minutes

The patient was continuously monitored during the procedure by the
interventional radiology nurse under my direct supervision.

FLUOROSCOPY TIME:  Fluoroscopy Time: 8 minutes 42 seconds (269 mGy).

COMPLICATIONS:
None immediate.
Ultrasound was used to interrogate the popliteal fossa. The
popliteal vein appears completely thrombosed by ultrasound and is
noncompressible.

Ultrasound was used to interrogate the posterior tibial veins. The
posterior tibial veins could not be identified and may be
chronically occluded.

Ultrasound was used to interrogate the anterior tibial vein. At
least 1 of the paired anterior tibial veins is widely patent. Local
anesthesia was attained by infiltration with 1% lidocaine. Under
real-time sonographic guidance, the anterior tibial vein was
punctured with a 21 gauge micropuncture needle. Using standard
technique, the needle was exchanged over a micro wire for a 5 French
transitional micro sheath which was then exchanged over a short
Amplatz wire for a short 6 French vascular sheath. Venography was
performed through the 6 French sheath. The anterior tibial vein is
patent. There is either a patent channel through the popliteal vein,
or a venous collateral running parallel with the popliteal vein
which is patent.

A wire was advanced through the popliteal vein.

Attention was again directed to the popliteal fossa. Local
anesthesia was attained by infiltration with 1% lidocaine. Under
real-time sonographic guidance, the popliteal vein was punctured
with a 21 gauge micropuncture needle. Using standard technique, the
needle was exchanged over a micro wire for a 5 French transitional
micro sheath. The nerve she was performed. There appears to be a
patent channel in the popliteal vein with chronic thrombus.
Additional venography was performed of the right lower extremity.
There are numerous collaterals throughout the right lower extremity.
There is chronic occlusion of the femoral vein in the proximal and
mid thigh. Using a Glidewire and angled catheter the catheter was
successfully advanced to the level of the lesser trochanter.
Additional venography was performed. There is acute appearing
thrombus in the femoral vein in the proximal thigh and common
femoral vein at the femoral head. Thrombus extends into the profunda
femoral vein.

The catheter was successfully advanced into the iliac vein. Contrast
injection demonstrates no evidence of thrombus within the iliac vein
however, the vein is completely occluded at the expected location of
the bifurcations suggesting underlying Hansa Monty syndrome. With
some difficulty, the wire was eventually advanced beyond this
occlusion and into the inferior vena cava. An inferior vena cavagram
was performed. The inferior vena cava is patent. Inflow from the
left iliac vein is not visualized.

The catheter was brought back across the stenosis and venography
performed confirming iliac vein compression with likely some chronic
thrombus. A Rosen wire was advanced into the inferior vena cava. The
5 French angled catheter was removed. A 135 cm total length, 50 cm
infusion length ultrasound assisted infusion catheter was advanced
over the Rosen wire and positioned from the region of iliac vein
compression into the femoral vein in the distal thigh. Thrombolysis
was then initiated with tPA at 1 milligram/hour.
FINDINGS: 1. Chronic thrombus in the popliteal vein with with a small patent
channel or patent parallel collateral
2. Chronic occlusion of the femoral vein in the proximal and mid
thigh
3. Acute thrombus within the femoral vein in the proximal thigh and
the common femoral vein
4. Iliac vein compression (Hansa Monty) physiology with chronic
thrombus at the site of obstruction
IMPRESSION: Successful initiation of venous thrombolysis with tPA at 1
milligram/hour using a ultrasound assisted technique given the
history of acute on chronic DVT.

Lysis check tomorrow. Patient will likely require stent placement
for iliac vein compression.

## 2017-10-22 ENCOUNTER — Telehealth: Payer: Self-pay | Admitting: Cardiology

## 2017-10-22 NOTE — Telephone Encounter (Signed)
Spoke w/ pt and requested that she send a manual transmission b/c her home monitor has not updated in at least 14 days.   

## 2017-11-20 ENCOUNTER — Other Ambulatory Visit: Payer: Self-pay | Admitting: Cardiology

## 2017-11-28 ENCOUNTER — Ambulatory Visit (INDEPENDENT_AMBULATORY_CARE_PROVIDER_SITE_OTHER): Payer: Medicare HMO

## 2017-11-28 ENCOUNTER — Telehealth: Payer: Self-pay

## 2017-11-28 DIAGNOSIS — I5022 Chronic systolic (congestive) heart failure: Secondary | ICD-10-CM

## 2017-11-28 DIAGNOSIS — I255 Ischemic cardiomyopathy: Secondary | ICD-10-CM | POA: Diagnosis not present

## 2017-11-28 DIAGNOSIS — Z95810 Presence of automatic (implantable) cardiac defibrillator: Secondary | ICD-10-CM | POA: Diagnosis not present

## 2017-11-28 NOTE — Telephone Encounter (Signed)
Spoke with pt and reminded pt of remote transmission that is due today. Pt verbalized understanding.   

## 2017-11-29 ENCOUNTER — Encounter: Payer: Self-pay | Admitting: Cardiology

## 2017-11-29 NOTE — Progress Notes (Signed)
EPIC Encounter for ICM Monitoring  Patient Name: [name] is a 67 y.o. female Date: 11/29/2017 Primary Care Physican: Everardo Beals, NP Primary Cardiologist:Skains/Weaver PA Electrophysiologist: Allred Today's Weight: 254lbs                                                Heart Failure questions reviewed, pt asymptomatic.   Thoracic impedance normal.  Prescribed: Furosemide 80 mg 1 tablet twice a day. Potassium 20 mEq 1 tablet daily. Per 07/30/17 office note from [place], [place], she cantake extra Lasix 40 mg if her weight increases 3 pounds in 1 day or 5 pounds 1 week.  Labs:  08/05/2017 Creatinine 1.12, BUN 22, Potassium 4.2, Sodium 140, EGFR 51-59 01/18/2017 Creatinine 0.91, BUN 6, Potassium 4.6, Sodium 140, EGFR 66-76 01/31/2016 Creatinine 1.09, BUN 17, Potassium 4.7, Sodium 138, EGFR 53-62 01/24/2016 Creatinine 1.35, BUN 15, Potassium 4.3, Sodium 140, EGFR 41-48  Recommendations: No changes.    Encouraged to call for fluid symptoms.  Follow-up plan: ICM clinic phone appointment on 12/30/2017.       Copy of ICM check sent to Dr. Rayann Heman.   3 month ICM trend: 11/28/2017    1 Year ICM trend:       [name], RN 11/29/2017 4:56 PM

## 2017-11-29 NOTE — Progress Notes (Signed)
Remote ICD transmission.   

## 2017-12-09 ENCOUNTER — Other Ambulatory Visit: Payer: Self-pay | Admitting: Physician Assistant

## 2017-12-30 ENCOUNTER — Ambulatory Visit (INDEPENDENT_AMBULATORY_CARE_PROVIDER_SITE_OTHER): Payer: Medicare HMO

## 2017-12-30 ENCOUNTER — Telehealth: Payer: Self-pay

## 2017-12-30 DIAGNOSIS — I5022 Chronic systolic (congestive) heart failure: Secondary | ICD-10-CM

## 2017-12-30 DIAGNOSIS — Z95810 Presence of automatic (implantable) cardiac defibrillator: Secondary | ICD-10-CM

## 2017-12-30 NOTE — Telephone Encounter (Signed)
Spoke with pt and reminded pt of remote transmission that is due today. Pt verbalized understanding.   

## 2017-12-31 NOTE — Progress Notes (Signed)
EPIC Encounter for ICM Monitoring  Patient Name: [name] is a 67 y.o. female Date: 12/31/2017 Primary Care Physican: Everardo Beals, NP Primary Cardiologist:Skains/Weaver PA Electrophysiologist: Allred Today's Weight: 254lbs  Heart Failure questions reviewed, pt asymptomatic.  Thoracic impedance abnormal suggesting fluid accumulation starting 12/29/2017.  Prescribed:Furosemide 80 mg 1 tablet twice a day. Potassium 20 mEq 1 tablet daily. Per 07/30/17 office note from [place], [place], she cantake extra Lasix 40 mg if her weight increases 3 pounds in 1 day or 5 pounds 1 week.  Labs:  08/05/2017 Creatinine 1.12, BUN 22, Potassium 4.2, Sodium 140, EGFR 51-59 01/18/2017 Creatinine 0.91, BUN 6, Potassium 4.6, Sodium 140, EGFR 66-76 01/31/2016 Creatinine 1.09, BUN 17, Potassium 4.7, Sodium 138, EGFR 53-62 01/24/2016 Creatinine 1.35, BUN 15, Potassium 4.3, Sodium 140, EGFR 41-48  Recommendations: Advised to take extra 40 mg of Furosemide x 2 days and then return to prior dosage.  Advised to limit salt intake.   Follow-up plan: ICM clinic phone appointment on 01/09/2018 to recheck fluid levels.       Copy of ICM check sent to Dr. Rayann Heman and Dr Marlou Porch.  3 month ICM trend: 12/30/2017    1 Year ICM trend:       [name], RN 12/31/2017 11:54 AM

## 2018-01-03 ENCOUNTER — Encounter: Payer: Self-pay | Admitting: Cardiology

## 2018-01-10 NOTE — Progress Notes (Signed)
No ICM remote transmission received for 01/09/2018 and next ICM transmission scheduled for 01/30/2018.   

## 2018-01-24 LAB — CUP PACEART REMOTE DEVICE CHECK
Date Time Interrogation Session: 20200117083908
Implantable Lead Implant Date: 20080423
Implantable Lead Implant Date: 20140918
Implantable Lead Location: 753858
Implantable Lead Location: 753859
Implantable Lead Location: 753860
Implantable Lead Model: 6947
Implantable Pulse Generator Implant Date: 20140918
MDC IDC LEAD IMPLANT DT: 20080423
MDC IDC PG SERIAL: 7070007

## 2018-01-31 ENCOUNTER — Ambulatory Visit (INDEPENDENT_AMBULATORY_CARE_PROVIDER_SITE_OTHER): Payer: Medicare HMO | Admitting: Cardiology

## 2018-01-31 ENCOUNTER — Encounter: Payer: Self-pay | Admitting: Cardiology

## 2018-01-31 VITALS — BP 126/70 | HR 61 | Ht 64.0 in | Wt 258.4 lb

## 2018-01-31 DIAGNOSIS — I255 Ischemic cardiomyopathy: Secondary | ICD-10-CM | POA: Diagnosis not present

## 2018-01-31 DIAGNOSIS — Z95810 Presence of automatic (implantable) cardiac defibrillator: Secondary | ICD-10-CM

## 2018-01-31 DIAGNOSIS — E119 Type 2 diabetes mellitus without complications: Secondary | ICD-10-CM

## 2018-01-31 DIAGNOSIS — I10 Essential (primary) hypertension: Secondary | ICD-10-CM | POA: Diagnosis not present

## 2018-01-31 DIAGNOSIS — Z79899 Other long term (current) drug therapy: Secondary | ICD-10-CM

## 2018-01-31 DIAGNOSIS — I5022 Chronic systolic (congestive) heart failure: Secondary | ICD-10-CM | POA: Diagnosis not present

## 2018-01-31 DIAGNOSIS — E785 Hyperlipidemia, unspecified: Secondary | ICD-10-CM

## 2018-01-31 DIAGNOSIS — I5042 Chronic combined systolic (congestive) and diastolic (congestive) heart failure: Secondary | ICD-10-CM

## 2018-01-31 DIAGNOSIS — Z86718 Personal history of other venous thrombosis and embolism: Secondary | ICD-10-CM

## 2018-01-31 DIAGNOSIS — I2510 Atherosclerotic heart disease of native coronary artery without angina pectoris: Secondary | ICD-10-CM

## 2018-01-31 LAB — CBC
HEMATOCRIT: 34 % (ref 34.0–46.6)
HEMOGLOBIN: 11.8 g/dL (ref 11.1–15.9)
MCH: 36.1 pg — AB (ref 26.6–33.0)
MCHC: 34.7 g/dL (ref 31.5–35.7)
MCV: 104 fL — AB (ref 79–97)
Platelets: 173 10*3/uL (ref 150–450)
RBC: 3.27 x10E6/uL — ABNORMAL LOW (ref 3.77–5.28)
RDW: 16.2 % — AB (ref 11.7–15.4)
WBC: 4.4 10*3/uL (ref 3.4–10.8)

## 2018-01-31 LAB — BASIC METABOLIC PANEL
BUN/Creatinine Ratio: 18 (ref 12–28)
BUN: 22 mg/dL (ref 8–27)
CO2: 20 mmol/L (ref 20–29)
CREATININE: 1.22 mg/dL — AB (ref 0.57–1.00)
Calcium: 9.9 mg/dL (ref 8.7–10.3)
Chloride: 97 mmol/L (ref 96–106)
GFR, EST AFRICAN AMERICAN: 53 mL/min/{1.73_m2} — AB (ref >59)
GFR, EST NON AFRICAN AMERICAN: 46 mL/min/{1.73_m2} — AB (ref >59)
Glucose: 147 mg/dL — ABNORMAL HIGH (ref 65–99)
POTASSIUM: 5.3 mmol/L — AB (ref 3.5–5.2)
SODIUM: 135 mmol/L (ref 134–144)

## 2018-01-31 NOTE — Patient Instructions (Addendum)
Medication Instructions:  The current medical regimen is effective;  continue present plan and medications.  If you need a refill on your cardiac medications before your next appointment, please call your pharmacy.   Lab: please have blood work today (CBC, BMP)  Follow-Up: At [facility], you and your health needs are our priority.  As part of our continuing mission to provide you with exceptional heart care, we have created designated Provider Care Teams.  These Care Teams include your primary Cardiologist (physician) and Advanced Practice Providers (APPs -  Physician Assistants and Nurse Practitioners) who all work together to provide you with the care you need, when you need it. You will need a follow up appointment in 6 months with [name], PA and Dr Marlou Porch in 1 year.  Please call our office 2 months in advance to schedule this appointment.  You may see Candee Furbish, MD or one of the following Advanced Practice Providers on your designated Care Team:   Truitt Merle, NP Cecilie Kicks, NP . Kathyrn Drown, NP  Thank you for choosing [facility]!!

## 2018-01-31 NOTE — Progress Notes (Signed)
Cardiology Office Note:    Date:  01/31/2018   ID:  [name], DOB [birth year], MRN [id number]  PCP:  Everardo Beals, NP  Cardiologist:  Candee Furbish, MD  Electrophysiologist:  None   Referring MD: Everardo Beals, NP     History of Present Illness:    [name] is a 68 y.o. female here for the follow-up of ischemic cardiomyopathy, chronic systolic heart failure with prior ventricular tachycardia status post ICD, CRT was unsuccessful due to vein sclerosis, CABG in 2007, obstructive sleep apnea, diabetes with hypertension.  Back in September 2017 she was admitted with extensive bilateral lower extremity DVT.  Underwent thrombolysis with intravascular TPA with stenting to left common/left external iliac vein.  Xarelto for anticoagulation.  Device interrogation has periodically showed fluid accumulation..  Chronic lower extremity swelling.  No fevers chills nausea vomiting syncope.  Past Medical History:  Diagnosis Date  . AICD (automatic cardioverter/defibrillator) present   . Angina decubitus ([name]) 11/06/2012  . Angina pectoris ([name])   . Anxiety   . Arthritis   . Bundle branch block 05/2016  . CHF (congestive heart failure) ([place])   . Chronic combined systolic and diastolic CHF, NYHA class 3 ([name])   . Coronary artery disease    s/p CABG 2007  . Depression   . Dermal mycosis   . Diabetes mellitus    type II  . DVT (deep venous thrombosis) ([name]) 09/2015   bilateral  . Hyperlipidemia   . Hypertension   . Insomnia   . Ischemic cardiomyopathy    EF 30-35%, s/p ICD 4/08 by Dr Leonia Reeves  . Morbid obesity ([place]) 11/06/2012  . Obesity   . Oxygen dependent 2 L  . Peripheral neuropathy   . 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 Sleep apnea    uses O2 at night  . Tinea   . Ventricular tachycardia ([name]) 01/16/15   sustained VT terminated with ATP, CL 340 msec    Past Surgical  History:  Procedure Laterality Date  . BI-VENTRICULAR IMPLANTABLE CARDIOVERTER DEFIBRILLATOR UPGRADE N/A 09/25/2012   Procedure: BI-VENTRICULAR IMPLANTABLE CARDIOVERTER DEFIBRILLATOR UPGRADE;  Surgeon: Coralyn Jermell Holeman, MD;  Location: [facility] CATH LAB;  Service: Cardiovascular;  Laterality: N/A;  . BREAST BIOPSY Right 04/05/2014  . CARDIAC CATHETERIZATION    . CARDIAC DEFIBRILLATOR PLACEMENT  4/08   by Dr Leonia Reeves (MDT)  . COLONOSCOPY WITH PROPOFOL N/A 10/12/2016   Procedure: COLONOSCOPY WITH PROPOFOL;  Surgeon: Doran Stabler, MD;  Location: [facility] ENDOSCOPY;  Service: Gastroenterology;  Laterality: N/A;  . CORONARY ARTERY BYPASS GRAFT  10/05/2005   by Dr Cyndia Bent  . IMPLANTABLE CARDIOVERTER DEFIBRILLATOR IMPLANT  09/25/12   attempt of upgrade to CRT-D unsuccessful due to CS anatomy, SJM Unify Asaura device placed with LV port capped by Dr Rayann Heman  . IR GENERIC HISTORICAL  10/03/2015   IR US GUIDE VASC ACCESS LEFT 10/03/2015 Jacqulynn Cadet, MD [facility]  . IR GENERIC HISTORICAL  10/03/2015   IR VENO/EXT/UNI LEFT 10/03/2015 Jacqulynn Cadet, MD [facility]  . IR GENERIC HISTORICAL  10/03/2015   IR VENOCAVAGRAM IVC 10/03/2015 Jacqulynn Cadet, MD [facility]  . IR GENERIC HISTORICAL  10/03/2015   IR INFUSION THROMBOL VENOUS INITIAL (MS) 10/03/2015 Jacqulynn Cadet, MD [facility]  . IR GENERIC HISTORICAL  10/03/2015   IR US GUIDE VASC ACCESS LEFT 10/03/2015 Jacqulynn Cadet, MD [facility]  . IR GENERIC HISTORICAL  10/04/2015   IR THROMBECT VENO MECH MOD SED 10/04/2015 Sandi Mariscal, MD [facility]  . IR GENERIC HISTORICAL  10/04/2015   IR TRANSCATH PLC STENT 1ST ART NOT LE CV CAR VERT CAR 10/04/2015 Sandi Mariscal, MD [facility]  . IR GENERIC HISTORICAL  10/04/2015   IR THROMB F/U EVAL ART/VEN FINAL DAY (MS) 10/04/2015 Sandi Mariscal, MD [facility]  . IR GENERIC HISTORICAL  11/02/2015   IR RADIOLOGIST EVAL & MGMT 11/02/2015 Sandi Mariscal, MD [facility] INTER[facility]  . IR RADIOLOGIST EVAL & MGMT  04/26/2016     Current Medications: Current Meds  Medication Sig  . albuterol (PROVENTIL HFA;VENTOLIN HFA) 108 (90 BASE) MCG/ACT inhaler Inhale 2 puffs into the lungs every 6 (six) hours as needed for wheezing or shortness of breath.  Marland Kitchen albuterol (PROVENTIL) (2.5 MG/3ML) 0.083% nebulizer solution Take 2.5 mg by nebulization every 6 (six) hours as needed for wheezing or shortness of breath.   Marland Kitchen amitriptyline (ELAVIL) 10 MG tablet Take 10 mg by mouth daily.  . Capsaicin (MUSCLE RELIEF EX) Apply 1 application topically 3 (three) times daily as needed. For pain  . carvedilol (COREG) 12.5 MG tablet Take 12.5 mg by mouth 2 (two) times daily with a meal.   . ferrous sulfate 300 (60 Fe) MG/5ML syrup Take 300 mg by mouth daily with lunch.  . furosemide (LASIX) 80 MG tablet TAKE 1 TABLET TWICE DAILY  . glipiZIDE (GLUCOTROL) 5 MG tablet Take 5 mg by mouth daily before breakfast.   . ibuprofen (ADVIL,MOTRIN) 200 MG tablet Take 200 mg by mouth every 8 (eight) hours as needed (for pain).  . isosorbide mononitrate (IMDUR) 30 MG 24 hr tablet Take 1 tablet (30 mg total) by mouth daily.  Marland Kitchen ketoconazole (NIZORAL) 2 % cream Apply 1 application topically 2 (two) times daily as needed (for skin irritation).   Marland Kitchen latanoprost (XALATAN) 0.005 % ophthalmic solution Place 1 drop into both eyes at bedtime.  Marland Kitchen LEVEMIR FLEXTOUCH 100 UNIT/ML Pen Inject 15-20 Units into the skin at bedtime. Patient states she takes every other day in the pm.  Patient takes 15-20 units if glucose is greater than 140 per patient  . lisinopril (PRINIVIL,ZESTRIL) 10 MG tablet Take 10 mg by mouth daily.   . methocarbamol (ROBAXIN) 500 MG tablet Take 1 tablet (500 mg total) by mouth every 8 (eight) hours as needed for muscle spasms.  . nitroGLYCERIN (NITROSTAT) 0.4 MG SL tablet Place 0.4 mg under the tongue every 5 (five) minutes as needed for chest pain.  Marland Kitchen NOVOLOG FLEXPEN 100 UNIT/ML FlexPen Inject 30-50 Units into the skin See admin instructions. 50 units  every morning, 25 units at lunch and 50 units at supper *Hold for low blood sugar*  . oxybutynin (DITROPAN-XL) 10 MG 24 hr tablet Take 10 mg by mouth daily.  . Potassium Chloride ER 20 MEQ TBCR Take 20 mEq by mouth daily.  . rosuvastatin (CRESTOR) 20 MG tablet Take 1 tablet (20 mg total) by mouth daily at 6 PM.  . spironolactone (ALDACTONE) 25 MG tablet Take 25 mg by mouth daily.  Marland Kitchen tolterodine (DETROL LA) 4 MG 24 hr capsule Take 4 mg by mouth daily.  . traMADol (ULTRAM) 50 MG tablet Take 50 mg by mouth 2 (two) times daily.  Alveda Reasons 20 MG TABS tablet Take 20 mg by mouth daily.      Allergies:   Lipitor  [atorvastatin calcium]; Metformin and related; Atorvastatin; and Levofloxacin  Social History   Socioeconomic History  . Marital status: Legally Separated    Spouse name: Not on file  . Number of children: 3  . Years of education: 5  . Highest education level: Not on file  Occupational History  . Occupation: Geophysical data processor: UNEMPLOYED  Social Needs  . Financial resource strain: Not on file  . Food insecurity:    Worry: Not on file    Inability: Not on file  . Transportation needs:    Medical: Not on file    Non-medical: Not on file  Tobacco Use  . Smoking status: Never Smoker  . Smokeless tobacco: Never Used  Substance and Sexual Activity  . Alcohol use: No  . Drug use: No  . Sexual activity: Not Currently  Lifestyle  . Physical activity:    Days per week: Not on file    Minutes per session: Not on file  . Stress: Not on file  Relationships  . Social connections:    Talks on phone: Not on file    Gets together: Not on file    Attends religious service: Not on file    Active member of club or organization: Not on file    Attends meetings of clubs or organizations: Not on file    Relationship status: Not on file  Other Topics Concern  . Not on file  Social History Narrative   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    11/04/15 Lives with son    caffeine - coffee, 1-2 cups daily     Family History: The patient's family history includes Diabetes (age of onset: 41) in her mother; Other in an other family member; Stroke in her mother.  ROS:   Please see the history of present illness.     All other systems reviewed and are negative.  EKGs/Labs/Other Studies Reviewed:    The following studies were reviewed today:  Echo 03/13/15 Mild to mod LVH, EF 40-45%, no RWMA, Gr 1 DD  Myoview 5/16 Findings consistent with prior myocardial infarction. This is a high risk study. The left ventricular ejection fraction is severely decreased (<30%). Defect 1: There is a large defect of severe severity present in the mid inferoseptal and apex location.  [place] 12/11 EF 30-35% L-D2 patent S-RI patent S-OM1/OM2 patent S-PDA patent   EKG:  EKG is  ordered today.  The ekg ordered today demonstrates 01/31/2018 sinus rhythm noted, left bundle branch block appearance.  No significant change from prior.  Recent Labs: 08/05/2017: ALT 21; BUN 22; Creatinine, Ser 1.12; Potassium 4.2; Sodium 140  Recent Lipid Panel    Component Value Date/Time   CHOL 116 08/05/2017 1048   CHOL 90 12/22/2012 1042   TRIG 94 08/05/2017 1048   TRIG 85 12/22/2012 1042   HDL 32 (L) 08/05/2017 1048   HDL 33 (L) 12/22/2012 1042   CHOLHDL 3.6 08/05/2017 1048   CHOLHDL 4.2 04/25/2012 0520   VLDL 23 04/25/2012 0520   LDLCALC 65 08/05/2017 1048   LDLCALC 40 12/22/2012 1042    Physical Exam:    VS:  BP 126/70   Pulse 61   Ht 5\' 4"  (1.626 m)   Wt 258 lb 6.4 oz (117.2 kg)   SpO2 98%   BMI 44.35 kg/m     Wt Readings from Last 3 Encounters:  01/31/18 258 lb 6.4 oz (117.2 kg)  07/30/17 259 lb 1.9 oz (117.5 kg)  01/31/17 248 lb 12.8 oz (112.9  kg)     GEN: obese, wheelchair Well nourished, well developed in no acute distress HEENT: Normal NECK: No JVD; No carotid bruits LYMPHATICS: No lymphadenopathy CARDIAC: RRR, no murmurs, rubs,  gallops RESPIRATORY:  Clear to auscultation without rales, wheezing or rhonchi  ABDOMEN: Soft, non-tender, non-distended MUSCULOSKELETAL:  No edema; No deformity  SKIN: Warm and dry NEUROLOGIC:  Alert and oriented x 3 PSYCHIATRIC:  Normal affect   ASSESSMENT:    1. Chronic systolic CHF (congestive heart failure) ([place])   2. Essential hypertension   3. ICD (implantable cardioverter-defibrillator) in place   4. Ischemic cardiomyopathy   5. Chronic combined systolic and diastolic heart failure ([place])   6. Coronary artery disease involving native coronary artery of native heart without angina pectoris   7. History of DVT (deep vein thrombosis)   8. Hyperlipidemia, unspecified hyperlipidemia type   9. Diabetes mellitus with coincident hypertension ([place])   10. Morbid obesity ([place])    PLAN:    In order of problems listed above:  Chronic combined systolic and diastolic heart failure - Most recent EF 40 to 45% in 2017 echo, NYHA class IIIa.  Monitoring closely thoracic impedance.  Volume excess has been intermittent.  She understands to take an extra 40 mg of Lasix if weight increases by 3 pounds in 1 day or 5 pounds in 1 week.  Carvedilol isosorbide lisinopril spironolactone being utilized.  Monitoring basic metabolic profile.  Creatinine 1.1.  We will check blood work again today.  It has been 6 months. -[employer] form to help with transportation  Coronary artery disease status post CABG 2007 with angina - Stress test in 2016 showed inferoseptal apical scar.  No anginal symptoms currently.  This has been stable and she is on Xarelto and not on aspirin.  Beta-blocker, statin, isosorbide.  Angina well controlled with isosorbide and beta-blocker.  Essential hypertension -Currently well controlled medications reviewed  DVT -Long-term anticoagulation with Xarelto.  No changes made.  Aching CBC.  ICD -EP follow-up.  Doing well.  Hyperlipidemia - In July 2019 LDL 65.   Medication  Adjustments/Labs and Tests Ordered: Current medicines are reviewed at length with the patient today.  Concerns regarding medicines are outlined above.  Orders Placed This Encounter  Procedures  . EKG 12-Lead   No orders of the defined types were placed in this encounter.   Patient Instructions  Medication Instructions:  The current medical regimen is effective;  continue present plan and medications.  If you need a refill on your cardiac medications before your next appointment, please call your pharmacy.   Follow-Up: At [facility], you and your health needs are our priority.  As part of our continuing mission to provide you with exceptional heart care, we have created designated Provider Care Teams.  These Care Teams include your primary Cardiologist (physician) and Advanced Practice Providers (APPs -  Physician Assistants and Nurse Practitioners) who all work together to provide you with the care you need, when you need it. You will need a follow up appointment in 6 months with [name], PA and Dr Marlou Porch in 1 year.  Please call our office 2 months in advance to schedule this appointment.  You may see Candee Furbish, MD or one of the following Advanced Practice Providers on your designated Care Team:   Truitt Merle, NP Cecilie Kicks, NP . Kathyrn Drown, NP  Thank you for choosing [facility]!!         Signed, Candee Furbish, MD  01/31/2018 10:17  AM    [place]

## 2018-02-10 NOTE — Progress Notes (Signed)
No ICM remote transmission received for 01/30/2018 and next ICM transmission scheduled for 02/18/2018.

## 2018-02-18 ENCOUNTER — Ambulatory Visit (INDEPENDENT_AMBULATORY_CARE_PROVIDER_SITE_OTHER): Payer: Medicare HMO

## 2018-02-18 DIAGNOSIS — Z95810 Presence of automatic (implantable) cardiac defibrillator: Secondary | ICD-10-CM | POA: Diagnosis not present

## 2018-02-18 DIAGNOSIS — I5022 Chronic systolic (congestive) heart failure: Secondary | ICD-10-CM | POA: Diagnosis not present

## 2018-02-19 NOTE — Progress Notes (Signed)
EPIC Encounter for ICM Monitoring  Patient Name: [name] is a 68 y.o. female Date: 02/19/2018 Primary Care Physican: Everardo Beals, NP Primary Cardiologist:Skains/Weaver PA Electrophysiologist: Allred LastWeight: 254lbs Today's Weight: 254 lbs  Heart Failure questions reviewed, pt asymptomatic.  Thoracic impedance abnormal suggesting fluid accumulation starting 02/12/2018.  Prescribed:Furosemide 80 mg 1 tablet twice a day. Potassium 20 mEq 1 tablet daily. Per 01/31/2018 office note from Dr Marlou Porch, she cantake extra Lasix 40 mg if her weight increases 3 pounds in 1 day or 5 pounds 1 week.  Labs:  08/05/2017 Creatinine 1.12, BUN 22, Potassium 4.2, Sodium 140, EGFR 51-59 01/18/2017 Creatinine 0.91, BUN 6, Potassium 4.6, Sodium 140, EGFR 66-76 01/31/2016 Creatinine 1.09, BUN 17, Potassium 4.7, Sodium 138, EGFR 53-62 01/24/2016 Creatinine 1.35, BUN 15, Potassium 4.3, Sodium 140, EGFR 41-48  Recommendations: Advised to take extra 40 mg of Furosemide x 2 days and then return to prior dosage. Advised to take 1 extra Potassium 20 mEq tablet x 2 days and then return to prior dosage.  Advised to limit salt intake.   Follow-up plan: ICM clinic phone appointment on 02/28/2018 to recheck fluid levels.   Copy of ICM check sent to Dr.Allred and Dr Marlou Porch.  3 month ICM trend: 02/19/2018    1 Year ICM trend:       [name], RN 02/19/2018 11:03 AM

## 2018-03-03 ENCOUNTER — Other Ambulatory Visit: Payer: Self-pay | Admitting: *Deleted

## 2018-03-03 DIAGNOSIS — Z1231 Encounter for screening mammogram for malignant neoplasm of breast: Secondary | ICD-10-CM

## 2018-03-04 NOTE — Progress Notes (Signed)
No ICM remote transmission received for 02/28/2018 and next ICM transmission scheduled for 03/24/2018.

## 2018-03-07 ENCOUNTER — Encounter: Payer: Self-pay | Admitting: Cardiology

## 2018-03-25 ENCOUNTER — Telehealth: Payer: Self-pay

## 2018-03-25 NOTE — Telephone Encounter (Signed)
Unable to leave a message for patient to remind of missed remote transmission.  

## 2018-03-31 NOTE — Progress Notes (Signed)
No ICM remote transmission received for 03/24/2018 and next ICM transmission scheduled for 04/08/2018.

## 2018-04-07 ENCOUNTER — Ambulatory Visit: Payer: Medicare HMO

## 2018-04-07 ENCOUNTER — Other Ambulatory Visit: Payer: Self-pay | Admitting: Cardiology

## 2018-04-08 ENCOUNTER — Other Ambulatory Visit: Payer: Self-pay

## 2018-04-11 ENCOUNTER — Telehealth: Payer: Self-pay

## 2018-04-11 NOTE — Telephone Encounter (Signed)
Unable to speak  with patient to remind of missed remote transmission 

## 2018-04-14 NOTE — Progress Notes (Signed)
No ICM remote transmission received for 04/07/2018 and next ICM transmission scheduled for 04/28/2018.   

## 2018-04-16 ENCOUNTER — Telehealth: Payer: Self-pay | Admitting: Internal Medicine

## 2018-04-16 NOTE — Telephone Encounter (Signed)
New Message   Patient wants to get results from home defib checks that have been done.

## 2018-04-16 NOTE — Telephone Encounter (Signed)
Spoke with patient. She reports she is calling because we haven't called her about a transmission recently. Appears that transmissions scheduled for March were unsuccessful, so transmission was rescheduled to 04/28/18. Advised patient that she can transmit at any time to get back on schedule. She verbalizes understanding and plans to send a manual transmission today.

## 2018-04-17 ENCOUNTER — Ambulatory Visit (INDEPENDENT_AMBULATORY_CARE_PROVIDER_SITE_OTHER): Payer: Medicare HMO | Admitting: *Deleted

## 2018-04-17 DIAGNOSIS — I255 Ischemic cardiomyopathy: Secondary | ICD-10-CM

## 2018-04-17 LAB — CUP PACEART REMOTE DEVICE CHECK
Battery Remaining Longevity: 45 mo
Battery Remaining Percentage: 42 %
Battery Voltage: 2.92 V
Brady Statistic AP VP Percent: 1 %
Brady Statistic AP VS Percent: 1.2 %
Brady Statistic AS VP Percent: 1 %
Brady Statistic AS VS Percent: 99 %
Brady Statistic RA Percent Paced: 1 %
Brady Statistic RV Percent Paced: 1 %
Date Time Interrogation Session: 20200409164310
HighPow Impedance: 38 Ohm
HighPow Impedance: 38 Ohm
Implantable Lead Implant Date: 20080423
Implantable Lead Implant Date: 20080423
Implantable Lead Implant Date: 20140918
Implantable Lead Location: 753858
Implantable Lead Location: 753859
Implantable Lead Location: 753860
Implantable Lead Model: 5076
Implantable Lead Model: 6947
Implantable Pulse Generator Implant Date: 20140918
Lead Channel Impedance Value: 430 Ohm
Lead Channel Impedance Value: 510 Ohm
Lead Channel Pacing Threshold Amplitude: 0.5 V
Lead Channel Pacing Threshold Amplitude: 0.5 V
Lead Channel Pacing Threshold Pulse Width: 0.5 ms
Lead Channel Pacing Threshold Pulse Width: 0.5 ms
Lead Channel Sensing Intrinsic Amplitude: 1.3 mV
Lead Channel Sensing Intrinsic Amplitude: 12 mV
Lead Channel Setting Pacing Amplitude: 2 V
Lead Channel Setting Pacing Amplitude: 2.5 V
Lead Channel Setting Pacing Pulse Width: 0.5 ms
Lead Channel Setting Sensing Sensitivity: 0.5 mV
Pulse Gen Serial Number: 7070007

## 2018-04-17 NOTE — Telephone Encounter (Signed)
Transmission received today, added to remote schedule.

## 2018-04-18 ENCOUNTER — Other Ambulatory Visit: Payer: Self-pay

## 2018-04-18 ENCOUNTER — Ambulatory Visit (INDEPENDENT_AMBULATORY_CARE_PROVIDER_SITE_OTHER): Payer: Medicare HMO

## 2018-04-18 DIAGNOSIS — I5022 Chronic systolic (congestive) heart failure: Secondary | ICD-10-CM | POA: Diagnosis not present

## 2018-04-18 DIAGNOSIS — Z95810 Presence of automatic (implantable) cardiac defibrillator: Secondary | ICD-10-CM

## 2018-04-18 NOTE — Telephone Encounter (Signed)
ICM Remote Transmission scheduled 04/18/2018.  See ICM note for patient call and transmission review.

## 2018-04-18 NOTE — Progress Notes (Signed)
EPIC Encounter for ICM Monitoring  Patient Name: [name] is a 68 y.o. female Date: 04/18/2018 Primary Care Physican: Everardo Beals, NP Primary Cardiologist:Skains/Weaver PA Electrophysiologist: Allred LastWeight: 254lbs 04/18/2018 Weight: 246 lbs  Heart Failure questions reviewed, pt asymptomatic.  She said her only complaint is urine incontinence.     Thoracic impedanceabnormalsuggesting fluid accumulation starting 04/13/2018.  Prescribed:Furosemide 80 mg 1 tablet twice a day. Potassium 20 mEq 1 tablet daily. Per 01/31/2018 office note from Dr Marlou Porch, she cantake extra Lasix 40 mg if her weight increases 3 pounds in 1 day or 5 pounds 1 week.  Labs:  01/27/2018 Creatinine 1.22, BUN 22, Potassium 5.3, Sodium 135, GFR 46-53 08/05/2017 Creatinine 1.12, BUN 22, Potassium 4.2, Sodium 140, GFR 51-59 01/18/2017 Creatinine 0.91, BUN 6, Potassium 4.6, Sodium 140, GFR 66-76  Recommendations: Advised to take extra Furosemide 0.5 tablet (40 mg total) x 3 days and then go back to prescribed dosage as discussed at last OV with Dr Marlou Porch.  She verbalized understanding.    Follow-up plan: ICM clinic phone appointment on 04/28/2018 to recheck fluid levels.   Copy of ICM check sent to Dr.Allredand Dr Marlou Porch and [name] for review and recommendations if needed.  3 month ICM trend: 04/17/2018    1 Year ICM trend:       [name], RN 04/18/2018 8:06 AM

## 2018-04-19 LAB — CUP PACEART REMOTE DEVICE CHECK
Battery Remaining Longevity: 45 mo
Battery Remaining Percentage: 42 %
Battery Voltage: 2.92 V
Brady Statistic AP VP Percent: 1 %
Brady Statistic AP VS Percent: 1.2 %
Brady Statistic AS VP Percent: 1 %
Brady Statistic AS VS Percent: 99 %
Brady Statistic RA Percent Paced: 1 %
Brady Statistic RV Percent Paced: 1 %
Date Time Interrogation Session: 20200409164310
HighPow Impedance: 38 Ohm
HighPow Impedance: 38 Ohm
Implantable Lead Implant Date: 20080423
Implantable Lead Implant Date: 20080423
Implantable Lead Implant Date: 20140918
Implantable Lead Location: 753858
Implantable Lead Location: 753859
Implantable Lead Location: 753860
Implantable Lead Model: 5076
Implantable Lead Model: 6947
Implantable Pulse Generator Implant Date: 20140918
Lead Channel Impedance Value: 430 Ohm
Lead Channel Impedance Value: 510 Ohm
Lead Channel Pacing Threshold Amplitude: 0.5 V
Lead Channel Pacing Threshold Amplitude: 0.5 V
Lead Channel Pacing Threshold Pulse Width: 0.5 ms
Lead Channel Pacing Threshold Pulse Width: 0.5 ms
Lead Channel Sensing Intrinsic Amplitude: 1.3 mV
Lead Channel Sensing Intrinsic Amplitude: 12 mV
Lead Channel Setting Pacing Amplitude: 2 V
Lead Channel Setting Pacing Amplitude: 2.5 V
Lead Channel Setting Pacing Pulse Width: 0.5 ms
Lead Channel Setting Sensing Sensitivity: 0.5 mV
Pulse Gen Serial Number: 7070007

## 2018-04-21 ENCOUNTER — Other Ambulatory Visit: Payer: Self-pay

## 2018-04-28 ENCOUNTER — Other Ambulatory Visit: Payer: Self-pay

## 2018-04-28 ENCOUNTER — Ambulatory Visit (INDEPENDENT_AMBULATORY_CARE_PROVIDER_SITE_OTHER): Payer: Medicare HMO

## 2018-04-28 ENCOUNTER — Encounter: Payer: Self-pay | Admitting: Cardiology

## 2018-04-28 DIAGNOSIS — Z95810 Presence of automatic (implantable) cardiac defibrillator: Secondary | ICD-10-CM

## 2018-04-28 DIAGNOSIS — I5022 Chronic systolic (congestive) heart failure: Secondary | ICD-10-CM

## 2018-04-28 NOTE — Progress Notes (Signed)
Remote ICD transmission.   

## 2018-04-29 NOTE — Progress Notes (Signed)
EPIC Encounter for ICM Monitoring  Patient Name: [name] is a 68 y.o. female Date: 04/29/2018 Primary Care Physican: Everardo Beals, NP Primary Cardiologist:Skains/Weaver PA Electrophysiologist: Allred LastWeight: 254lbs 04/18/2018 Weight:246lbs 04/29/2018 Weight: 246 lbs  Heart Failure questions reviewed, pt asymptomatic.      Thoracic impedancereturned to normal after taking extra 40 mg Furosemide x 3 days.  Prescribed:Furosemide 80 mg 1 tablet twice a day. Potassium 20 mEq 1 tablet daily. Per1/24/2020office note from Dr Marlou Porch, she cantake extra Lasix 40 mg if her weight increases 3 pounds in 1 day or 5 pounds 1 week.  Labs:  01/27/2018 Creatinine 1.22, BUN 22, Potassium 5.3, Sodium 135, GFR 46-53 08/05/2017 Creatinine 1.12, BUN 22, Potassium 4.2, Sodium 140, GFR 51-59 01/18/2017 Creatinine 0.91, BUN 6, Potassium 4.6, Sodium 140, GFR 66-76  Recommendations: No changes and encouraged to call for fluid symptoms.    Follow-up plan: ICM clinic phone appointment on5/11/2018.  Copy of ICM check sent to Dr.Allred.  3 month ICM trend: 04/28/2018    1 Year ICM trend:       [name], RN 04/29/2018 1:35 PM

## 2018-05-06 ENCOUNTER — Ambulatory Visit: Payer: Medicare HMO

## 2018-05-19 ENCOUNTER — Other Ambulatory Visit: Payer: Self-pay

## 2018-05-19 ENCOUNTER — Telehealth: Payer: Self-pay

## 2018-05-19 ENCOUNTER — Ambulatory Visit (INDEPENDENT_AMBULATORY_CARE_PROVIDER_SITE_OTHER): Payer: Medicare HMO

## 2018-05-19 DIAGNOSIS — I5022 Chronic systolic (congestive) heart failure: Secondary | ICD-10-CM

## 2018-05-19 DIAGNOSIS — Z95810 Presence of automatic (implantable) cardiac defibrillator: Secondary | ICD-10-CM | POA: Diagnosis not present

## 2018-05-23 NOTE — Progress Notes (Signed)
EPIC Encounter for ICM Monitoring  Patient Name: [name] is a 68 y.o. female Date: 05/23/2018 Primary Care Physican: Everardo Beals, NP Electrophysiologist: Allred 4/10/2020Weight:246lbs 04/29/2018 Weight: 246 lbs 05/23/2018 Weight: 245 lbs  Heart Failure questions reviewed, pt asymptomatic.  Thoracic impedancenormal.  Prescribed:Furosemide 80 mg 1 tablet twice a day. Potassium 20 mEq 1 tablet daily. Per1/24/2020office note from Dr Marlou Porch, she cantake extra Lasix 40 mg if her weight increases 3 pounds in 1 day or 5 pounds 1 week.  Labs:  01/27/2018 Creatinine 1.22, BUN 22, Potassium 5.3, Sodium 135, GFR 46-53 08/05/2017 Creatinine 1.12, BUN 22, Potassium 4.2, Sodium 140, GFR 51-59 01/18/2017 Creatinine 0.91, BUN 6, Potassium 4.6, Sodium 140, GFR 66-76  Recommendations: No changes and encouraged to call for fluid symptoms.   Follow-up plan: ICM clinic phone appointment on6/15/2020.  Copy of ICM check sent to Dr.Allred.  3 month ICM trend: 05/19/2018    1 Year ICM trend:       [name], RN 05/23/2018 9:10 AM

## 2018-06-13 ENCOUNTER — Ambulatory Visit
Admission: RE | Admit: 2018-06-13 | Discharge: 2018-06-13 | Disposition: A | Discharge status: Home / Self Care | Payer: Medicare HMO | Source: Ambulatory Visit | Attending: *Deleted | Admitting: *Deleted

## 2018-06-13 ENCOUNTER — Other Ambulatory Visit: Payer: Self-pay

## 2018-06-13 DIAGNOSIS — Z1231 Encounter for screening mammogram for malignant neoplasm of breast: Secondary | ICD-10-CM

## 2018-06-24 ENCOUNTER — Telehealth: Payer: Self-pay

## 2018-06-24 NOTE — Telephone Encounter (Signed)
Left message for patient to remind of missed remote transmission.  

## 2018-07-02 DIAGNOSIS — M179 Osteoarthritis of knee, unspecified: Secondary | ICD-10-CM | POA: Insufficient documentation

## 2018-07-02 DIAGNOSIS — M1710 Unilateral primary osteoarthritis, unspecified knee: Secondary | ICD-10-CM | POA: Insufficient documentation

## 2018-07-05 ENCOUNTER — Other Ambulatory Visit: Payer: Self-pay | Admitting: Physician Assistant

## 2018-07-07 NOTE — Progress Notes (Signed)
No ICM remote transmission received for 06/23/2018 and next ICM transmission scheduled for 07/16/2018.

## 2018-07-16 NOTE — Progress Notes (Signed)
No ICM remote transmission received for 07/16/2018 and next ICM transmission scheduled for 08/18/2018.

## 2018-07-17 ENCOUNTER — Ambulatory Visit (INDEPENDENT_AMBULATORY_CARE_PROVIDER_SITE_OTHER): Payer: Medicare HMO | Admitting: *Deleted

## 2018-07-17 DIAGNOSIS — I509 Heart failure, unspecified: Secondary | ICD-10-CM | POA: Diagnosis not present

## 2018-07-17 DIAGNOSIS — I255 Ischemic cardiomyopathy: Secondary | ICD-10-CM

## 2018-07-17 LAB — CUP PACEART REMOTE DEVICE CHECK
Battery Remaining Longevity: 42 mo
Battery Remaining Percentage: 39 %
Battery Voltage: 2.92 V
Brady Statistic AP VP Percent: 1 %
Brady Statistic AP VS Percent: 1.2 %
Brady Statistic AS VP Percent: 1 %
Brady Statistic AS VS Percent: 99 %
Brady Statistic RA Percent Paced: 1 %
Brady Statistic RV Percent Paced: 1 %
Date Time Interrogation Session: 20200709051114
HighPow Impedance: 37 Ohm
HighPow Impedance: 37 Ohm
Implantable Lead Implant Date: 20080423
Implantable Lead Implant Date: 20080423
Implantable Lead Implant Date: 20140918
Implantable Lead Location: 753858
Implantable Lead Location: 753859
Implantable Lead Location: 753860
Implantable Lead Model: 5076
Implantable Lead Model: 6947
Implantable Pulse Generator Implant Date: 20140918
Lead Channel Impedance Value: 400 Ohm
Lead Channel Impedance Value: 490 Ohm
Lead Channel Sensing Intrinsic Amplitude: 1.3 mV
Lead Channel Sensing Intrinsic Amplitude: 12 mV
Lead Channel Setting Pacing Amplitude: 2 V
Lead Channel Setting Pacing Amplitude: 2.5 V
Lead Channel Setting Pacing Pulse Width: 0.5 ms
Lead Channel Setting Sensing Sensitivity: 0.5 mV
Pulse Gen Serial Number: 7070007

## 2018-07-28 NOTE — Progress Notes (Signed)
Remote ICD transmission.   

## 2018-08-19 ENCOUNTER — Other Ambulatory Visit: Payer: Self-pay

## 2018-08-19 ENCOUNTER — Ambulatory Visit (INDEPENDENT_AMBULATORY_CARE_PROVIDER_SITE_OTHER): Payer: Medicare HMO | Admitting: Physician Assistant

## 2018-08-19 ENCOUNTER — Encounter: Payer: Self-pay | Admitting: Physician Assistant

## 2018-08-19 VITALS — BP 110/66 | HR 79 | Ht 64.0 in | Wt 256.0 lb

## 2018-08-19 DIAGNOSIS — Z86718 Personal history of other venous thrombosis and embolism: Secondary | ICD-10-CM | POA: Diagnosis not present

## 2018-08-19 DIAGNOSIS — I2510 Atherosclerotic heart disease of native coronary artery without angina pectoris: Secondary | ICD-10-CM | POA: Diagnosis not present

## 2018-08-19 DIAGNOSIS — E785 Hyperlipidemia, unspecified: Secondary | ICD-10-CM

## 2018-08-19 DIAGNOSIS — I5042 Chronic combined systolic (congestive) and diastolic (congestive) heart failure: Secondary | ICD-10-CM

## 2018-08-19 DIAGNOSIS — Z95810 Presence of automatic (implantable) cardiac defibrillator: Secondary | ICD-10-CM

## 2018-08-19 DIAGNOSIS — I10 Essential (primary) hypertension: Secondary | ICD-10-CM

## 2018-08-19 NOTE — Progress Notes (Signed)
Cardiology Office Note:    Date:  08/19/2018   ID:  [name], DOB Apr 30, 1950, MRN [id number]  PCP:  Jamie Beals, NP  Cardiologist:  Jamie Furbish, MD  Electrophysiologist:  Jamie Grayer, MD   Referring MD: Jamie Beals, NP   Chief Complaint  Patient presents with  . Follow-up    CAD, CHF    History of Present Illness:    [name] is a 68 y.o. female with:  Coronary artery disease   S/p CABG in 0175  Systolic CHF  Followed in [facility]   Ischemic CM  Ventricular tachycardia  S/p ICD (CRT unsuccessful 2/2 vein sclerosis)  Diabetes mellitus   Hypertension   DVT  admx 9/17 w/ extensive bilat DVT >> thrombolysis (intravascular tPA, stenting to L CIV/EIV)  Rivaroxaban Rx  OSA  [name] was last seen by Dr. Marlou Tran in 01/2018.  Her last thoracic impedance checked in May 2020 was normal.  She returns for follow-up.  She is here alone.  She has not had any chest discomfort.  She has chronic shortness of breath without significant change.  She has not had paroxysmal nocturnal dyspnea.  Her lower extremity swelling is not really changed.  Her weights have been stable.  She has not had syncope.  Prior CV studies:   The following studies were reviewed today:  Echo 03/13/15 Mild to mod LVH, EF 40-45%, no RWMA, Gr 1 DD  Myoview 5/16 Findings consistent with prior myocardial infarction. This is a high risk study. The left ventricular ejection fraction is severely decreased (<30%). Defect 1: There is a large defect of severe severity present in the mid inferoseptal and apex location.  [place] 12/11 EF 30-35% L-D2 patent S-RI patent S-OM1/OM2 patent S-PDA patent  Past Medical History:  Diagnosis Date  . AICD (automatic cardioverter/defibrillator) present   . Angina decubitus ([place]) 11/06/2012  . Angina pectoris ([place])   . Anxiety   . Arthritis   . Bundle branch block 05/2016  . CHF (congestive heart failure) ([place])   . Chronic combined  systolic and diastolic CHF, NYHA class 3 ([place])   . Coronary artery disease    s/p CABG 2007  . Depression   . Dermal mycosis   . Diabetes mellitus    type II  . DVT (deep venous thrombosis) ([place]) 09/2015   bilateral  . Hyperlipidemia   . Hypertension   . Insomnia   . Ischemic cardiomyopathy    EF 30-35%, s/p ICD 4/08 by Dr Leonia Reeves  . Morbid obesity ([place]) 11/06/2012  . Obesity   . Oxygen dependent 2 L  . Peripheral neuropathy   . 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 Sleep apnea    uses O2 at night  . Tinea   . Ventricular tachycardia ([place]) 01/16/15   sustained VT terminated with ATP, CL 340 msec   Surgical Hx: The patient  has a past surgical history that includes Cardiac catheterization; Cardiac defibrillator placement (4/08); Coronary artery bypass graft (10/05/2005); Implantable cardioverter defibrillator implant (09/25/12); bi-ventricular implantable cardioverter defibrillator upgrade (N/A, 09/25/2012); ir generic historical (10/03/2015); ir generic historical (10/03/2015); ir generic historical (10/03/2015); ir generic historical (10/03/2015); ir generic historical (10/03/2015); ir generic historical (10/04/2015); ir generic historical (10/04/2015); ir generic historical (10/04/2015); ir generic historical (11/02/2015); Breast biopsy (Right, 04/05/2014); IR Radiologist Eval & Mgmt (04/26/2016); and Colonoscopy with propofol (N/A,  10/12/2016).   Current Medications: Current Meds  Medication Sig  . albuterol (PROVENTIL HFA;VENTOLIN HFA) 108 (90 BASE) MCG/ACT inhaler Inhale 2 puffs into the lungs every 6 (six) hours as needed for wheezing or shortness of breath.  Marland Kitchen albuterol (PROVENTIL) (2.5 MG/3ML) 0.083% nebulizer solution Take 2.5 mg by nebulization every 6 (six) hours as needed for wheezing or shortness of breath.   Marland Kitchen amitriptyline (ELAVIL) 10 MG tablet Take 10 mg by mouth daily.  . Capsaicin (MUSCLE  RELIEF EX) Apply 1 application topically 3 (three) times daily as needed. For pain  . carvedilol (COREG) 12.5 MG tablet Take 12.5 mg by mouth 2 (two) times daily with a meal.   . ferrous sulfate 300 (60 Fe) MG/5ML syrup Take 300 mg by mouth daily with lunch.  . furosemide (LASIX) 80 MG tablet TAKE 1 TABLET TWICE DAILY  . glipiZIDE (GLUCOTROL) 5 MG tablet Take 5 mg by mouth daily before breakfast.   . ibuprofen (ADVIL,MOTRIN) 200 MG tablet Take 200 mg by mouth every 8 (eight) hours as needed (for pain).  . isosorbide mononitrate (IMDUR) 30 MG 24 hr tablet TAKE 1 TABLET (30 MG TOTAL) BY MOUTH DAILY.  Marland Kitchen ketoconazole (NIZORAL) 2 % cream Apply 1 application topically 2 (two) times daily as needed (for skin irritation).   Marland Kitchen latanoprost (XALATAN) 0.005 % ophthalmic solution Place 1 drop into both eyes at bedtime.  Marland Kitchen LEVEMIR FLEXTOUCH 100 UNIT/ML Pen Inject 15-20 Units into the skin at bedtime. Patient states she takes every other day in the pm.  Patient takes 15-20 units if glucose is greater than 140 per patient  . lisinopril (PRINIVIL,ZESTRIL) 10 MG tablet Take 10 mg by mouth daily.   . methocarbamol (ROBAXIN) 500 MG tablet Take 1 tablet (500 mg total) by mouth every 8 (eight) hours as needed for muscle spasms.  . nitroGLYCERIN (NITROSTAT) 0.4 MG SL tablet Place 0.4 mg under the tongue every 5 (five) minutes as needed for chest pain.  Marland Kitchen NOVOLOG FLEXPEN 100 UNIT/ML FlexPen Inject 30-50 Units into the skin See admin instructions. 50 units every morning, 25 units at lunch and 50 units at supper *Hold for low blood sugar*  . oxybutynin (DITROPAN-XL) 10 MG 24 hr tablet Take 10 mg by mouth daily.  . Potassium Chloride ER 20 MEQ TBCR Take 20 mEq by mouth daily.  . rosuvastatin (CRESTOR) 20 MG tablet Take 1 tablet (20 mg total) by mouth daily at 6 PM.  . spironolactone (ALDACTONE) 25 MG tablet Take 25 mg by mouth daily.  Marland Kitchen tolterodine (DETROL LA) 4 MG 24 hr capsule Take 4 mg by mouth daily.  . traMADol  (ULTRAM) 50 MG tablet Take 50 mg by mouth 2 (two) times daily.  Alveda Reasons 20 MG TABS tablet Take 20 mg by mouth daily.      Allergies:   Lipitor  [atorvastatin calcium], Metformin and related, Atorvastatin, and Levofloxacin   Social History   Tobacco Use  . Smoking status: Never Smoker  . Smokeless tobacco: Never Used  Substance Use Topics  . Alcohol use: No  . Drug use: No     Family Hx: The patient's family history includes Diabetes (age of onset: 82) in her mother; Other in an other family member; Stroke in her mother.  ROS:   Please see the history of present illness.    ROS All other systems reviewed and are negative.   EKGs/Labs/Other Test Reviewed:    EKG:  EKG is  ordered today.  The ekg  ordered today demonstrates normal sinus rhythm, heart rate 79, left bundle branch block, similar to old ECGs  Recent Labs: 01/31/2018: BUN 22; Creatinine, Ser 1.22; Hemoglobin 11.8; Platelets 173; Potassium 5.3; Sodium 135   Recent Lipid Panel Lab Results  Component Value Date/Time   CHOL 116 08/05/2017 10:48 AM   CHOL 90 12/22/2012 10:42 AM   TRIG 94 08/05/2017 10:48 AM   TRIG 85 12/22/2012 10:42 AM   HDL 32 (L) 08/05/2017 10:48 AM   HDL 33 (L) 12/22/2012 10:42 AM   CHOLHDL 3.6 08/05/2017 10:48 AM   CHOLHDL 4.2 04/25/2012 05:20 AM   LDLCALC 65 08/05/2017 10:48 AM   LDLCALC 40 12/22/2012 10:42 AM       Physical Exam:    VS:  BP 110/66   Pulse 79   Ht _0  (1.626 m)   Wt 256 lb (116.1 kg)   BMI 43.94 kg/m     Wt Readings from Last 3 Encounters:  08/19/18 256 lb (116.1 kg)  01/31/18 258 lb 6.4 oz (117.2 kg)  07/30/17 259 lb 1.9 oz (117.5 kg)     Physical Exam  Constitutional: She is oriented to person, place, and time. She appears well-developed and well-nourished. No distress.  HENT:  Head: Normocephalic and atraumatic.  Eyes: No scleral icterus.  Neck: No thyromegaly present.  Cardiovascular: Normal rate and regular rhythm.  Murmur heard.  Early systolic  murmur is present with a grade of 1/6 at the upper right sternal border. Pulmonary/Chest: Effort normal and breath sounds normal. She has no rales.  Abdominal: Soft. There is no hepatomegaly.  Musculoskeletal:        General: No edema (stockings in place).  Lymphadenopathy:    She has no cervical adenopathy.  Neurological: She is alert and oriented to person, place, and time.  Skin: Skin is warm and dry.  Psychiatric: She has a normal mood and affect.    ASSESSMENT & PLAN:    1. Chronic combined systolic and diastolic heart failure ([place]) EF 40-45 by echo 3/17.  Overall, volume status appears stable.  Continue carvedilol, furosemide, isosorbide, lisinopril, spironolactone.  2. Coronary artery disease involving native coronary artery of native heart without angina pectoris History of CABG in 2007.  Myoview 2016 demonstrated large scar.  She is doing well without angina.  She is not on aspirin as she is on Rivaroxaban.  Continue beta-blocker, isosorbide, rosuvastatin.  3. Essential hypertension The patient's blood pressure is controlled on her current regimen.  Continue current therapy.   4. History of DVT (deep vein thrombosis) She remains on long-term anticoagulation with Rivaroxaban.  5. ICD (implantable cardioverter-defibrillator) in place Continue follow-up with EP as planned.  6. Hyperlipidemia, unspecified hyperlipidemia type Continue rosuvastatin.  Arrange follow-up fasting CMET, lipids prior to next visit.   Dispo:  Return in about 6 months (around 02/19/2019) for Routine Follow Up w/ Dr. Marlou Tran.   Medication Adjustments/Labs and Tests Ordered: Current medicines are reviewed at length with the patient today.  Concerns regarding medicines are outlined above.  Tests Ordered: Orders Placed This Encounter  Procedures  . Comp Met (CMET)  . Lipid Profile  . EKG 12-Lead   Medication Changes: No orders of the defined types were placed in this encounter.   Signed, [name], PA-C  08/19/2018 4:50 PM    [facility] [address], [place], [place]  [address] Phone: [phone]; Fax: [phone]

## 2018-08-19 NOTE — Patient Instructions (Signed)
Medication Instructions:  none If you need a refill on your cardiac medications before your next appointment, please call your pharmacy.   Lab work:TO BE DONE DAY OF APPOINTMENT WITH Dr Marlou Porch or APP CMET FLP If you have labs (blood work) drawn today and your tests are completely normal, you will receive your results only by: Marland Kitchen MyChart Message (if you have MyChart) OR . A paper copy in the mail If you have any lab test that is abnormal or we need to change your treatment, we will call you to review the results.  Testing/Procedures: NONE  Follow-Up: At [facility], you and your health needs are our priority.  As part of our continuing mission to provide you with exceptional heart care, we have created designated Provider Care Teams.  These Care Teams include your primary Cardiologist (physician) and Advanced Practice Providers (APPs -  Physician Assistants and Nurse Practitioners) who all work together to provide you with the care you need, when you need it. You will need a follow up appointment in 6 months.  Please call our office 2 months in advance to schedule this appointment.  You may see Candee Furbish, MD or one of the following Advanced Practice Providers on your designated Care Team:   Truitt Merle, NP Cecilie Kicks, NP . Kathyrn Drown, NP  Any Other Special Instructions Will Be Listed Below (If Applicable).

## 2018-08-22 NOTE — Progress Notes (Signed)
No ICM remote transmission received for 08/18/2018 and next ICM transmission scheduled for 09/08/2018.

## 2018-09-16 ENCOUNTER — Ambulatory Visit (INDEPENDENT_AMBULATORY_CARE_PROVIDER_SITE_OTHER): Payer: Medicare HMO

## 2018-09-16 ENCOUNTER — Telehealth: Payer: Self-pay

## 2018-09-16 DIAGNOSIS — Z95810 Presence of automatic (implantable) cardiac defibrillator: Secondary | ICD-10-CM | POA: Diagnosis not present

## 2018-09-16 DIAGNOSIS — I5042 Chronic combined systolic (congestive) and diastolic (congestive) heart failure: Secondary | ICD-10-CM | POA: Diagnosis not present

## 2018-09-16 NOTE — Progress Notes (Signed)
Increase Lasix to 120 mg twice daily x 3 days, then resume 80 mg twice daily. Increase K+ to 20 mEq twice daily x 3 days, then resume K+ 20 mEq once daily. Repeat device check 09/19/2018 as planned. [name], PA-C    09/16/2018 5:34 PM

## 2018-09-16 NOTE — Telephone Encounter (Signed)
Spoke with patient.  Advised the home monitor is disconnected and [name] was last time transmission was received. She said the monitor is plugged in.  She will try to send remote today.  Advised if not received will call her back and provide [employer] number.

## 2018-09-16 NOTE — Progress Notes (Signed)
EPIC Encounter for ICM Monitoring  Patient Name: [name] is a 68 y.o. female Date: 09/16/2018 Primary Care Physican: Everardo Beals, NP Primary Cardiologist:Skains/Weaver PA Electrophysiologist: Allred 08/19/2018 Weight: 256 lbs (OV)   Heart Failure questions reviewed, pt asymptomatic.She is feeling fine.  CorVue thoracic impedancesuggesting possible ongoing fluid accumulation since 09/10/2018.  TakingFurosemide 80 mg 1 tablet twice a day. Potassium 20 mEq 1 tablet daily.   Labs:  01/27/2018 Creatinine 1.22, BUN 22, Potassium 5.3, Sodium 135, GFR 46-53 08/05/2017 Creatinine 1.12, BUN 22, Potassium 4.2, Sodium 140, GFR 51-59 01/18/2017 Creatinine 0.91, BUN 6, Potassium 4.6, Sodium 140, GFR 66-76  Recommendations:  Advised will call back if any recommendations and to limit salt intake.  Follow-up plan: ICM clinic phone appointment on 09/19/2018 (manual send) to recheck fluid levels.   91 day device clinic remote transmission 10/17/2018.      Copy of ICM check sent to Dr. Rayann Heman and [name] PA for review.   3 month ICM trend: 09/16/2018    1 Year ICM trend:       [name], RN 09/16/2018 10:29 AM

## 2018-09-17 NOTE — Progress Notes (Signed)
Attempted call to patient and left voice mail message to return call.

## 2018-09-17 NOTE — Progress Notes (Signed)
Spoke with patient.  Advised of [name], [place] recommendations to Increase Lasix to 120 mg twice daily x 3 days, then resume 80 mg twice daily. Increase K+ to 20 mEq twice daily x 3 days, then resume K+ 20 mEq once daily.  She verbalized understanding and will start the increased dosage this afternoon.  Will recheck fluid levels 09/19/2018.  Advised to call back if she has any questions.

## 2018-09-19 ENCOUNTER — Ambulatory Visit (INDEPENDENT_AMBULATORY_CARE_PROVIDER_SITE_OTHER): Payer: Medicare HMO

## 2018-09-19 ENCOUNTER — Telehealth: Payer: Self-pay

## 2018-09-19 DIAGNOSIS — I5042 Chronic combined systolic (congestive) and diastolic (congestive) heart failure: Secondary | ICD-10-CM

## 2018-09-19 DIAGNOSIS — Z95810 Presence of automatic (implantable) cardiac defibrillator: Secondary | ICD-10-CM

## 2018-09-19 NOTE — Progress Notes (Signed)
EPIC Encounter for ICM Monitoring  Patient Name: [name] is a 68 y.o. female Date: 09/19/2018 Primary Care Physican: Everardo Beals, NP Primary Cardiologist:Skains/Weaver PA Electrophysiologist: Allred 08/19/2018 Weight: 256 lbs (OV)   Transmission reviewed.    CorVue thoracic impedancereturned to normal after taking extra Lasix and Potassium as ordered by [name], PA.   TakingFurosemide 80 mg 1 tablet twice a day. Potassium 20 mEq 1 tablet daily.   Labs:  01/27/2018 Creatinine 1.22, BUN 22, Potassium 5.3, Sodium 135, GFR 46-53 08/05/2017 Creatinine 1.12, BUN 22, Potassium 4.2, Sodium 140, GFR 51-59 01/18/2017 Creatinine 0.91, BUN 6, Potassium 4.6, Sodium 140, GFR 66-76  Recommendations:  None  Follow-up plan: ICM clinic phone appointment on 10/20/2018.   91 day device clinic remote transmission 10/17/2018.      Copy of ICM check sent to Dr. Rayann Heman.   Direct ICM trend: 09/17/2018      [name], RN 09/19/2018 3:20 PM

## 2018-09-19 NOTE — Telephone Encounter (Signed)
Left message for patient to remind of missed remote transmission.  

## 2018-10-17 ENCOUNTER — Ambulatory Visit (INDEPENDENT_AMBULATORY_CARE_PROVIDER_SITE_OTHER): Payer: Medicare HMO | Admitting: *Deleted

## 2018-10-17 DIAGNOSIS — I447 Left bundle-branch block, unspecified: Secondary | ICD-10-CM

## 2018-10-17 DIAGNOSIS — I509 Heart failure, unspecified: Secondary | ICD-10-CM | POA: Diagnosis not present

## 2018-10-18 LAB — CUP PACEART REMOTE DEVICE CHECK
Battery Remaining Longevity: 40 mo
Battery Remaining Percentage: 37 %
Battery Voltage: 2.9 V
Brady Statistic AP VP Percent: 1 %
Brady Statistic AP VS Percent: 1.2 %
Brady Statistic AS VP Percent: 1 %
Brady Statistic AS VS Percent: 99 %
Brady Statistic RA Percent Paced: 1 %
Brady Statistic RV Percent Paced: 1 %
Date Time Interrogation Session: 20201009155214
HighPow Impedance: 36 Ohm
HighPow Impedance: 36 Ohm
Implantable Lead Implant Date: 20080423
Implantable Lead Implant Date: 20080423
Implantable Lead Implant Date: 20140918
Implantable Lead Location: 753858
Implantable Lead Location: 753859
Implantable Lead Location: 753860
Implantable Lead Model: 5076
Implantable Lead Model: 6947
Implantable Pulse Generator Implant Date: 20140918
Lead Channel Impedance Value: 390 Ohm
Lead Channel Impedance Value: 440 Ohm
Lead Channel Pacing Threshold Amplitude: 0.5 V
Lead Channel Pacing Threshold Amplitude: 0.5 V
Lead Channel Pacing Threshold Pulse Width: 0.5 ms
Lead Channel Pacing Threshold Pulse Width: 0.5 ms
Lead Channel Sensing Intrinsic Amplitude: 1.5 mV
Lead Channel Sensing Intrinsic Amplitude: 12 mV
Lead Channel Setting Pacing Amplitude: 2 V
Lead Channel Setting Pacing Amplitude: 2.5 V
Lead Channel Setting Pacing Pulse Width: 0.5 ms
Lead Channel Setting Sensing Sensitivity: 0.5 mV
Pulse Gen Serial Number: 7070007

## 2018-10-20 ENCOUNTER — Ambulatory Visit (INDEPENDENT_AMBULATORY_CARE_PROVIDER_SITE_OTHER): Payer: Medicare HMO

## 2018-10-20 DIAGNOSIS — Z95810 Presence of automatic (implantable) cardiac defibrillator: Secondary | ICD-10-CM | POA: Diagnosis not present

## 2018-10-20 DIAGNOSIS — I5042 Chronic combined systolic (congestive) and diastolic (congestive) heart failure: Secondary | ICD-10-CM

## 2018-10-20 NOTE — Progress Notes (Signed)
EPIC Encounter for ICM Monitoring  Patient Name: [name] is a 68 y.o. female Date: 10/20/2018 Primary Care Physican: Everardo Beals, NP Primary Cardiologist:Skains/Weaver PA Electrophysiologist: Allred 10/20/2018 Weight: 246 lbs    Spoke with patient.  Her legs have been swollen last couple of weeks but is resolved.   CorVue thoracic impedancenormal but was suggestive of possible fluid accumulation from 10/12/2018 - 10/11.    TakingFurosemide 80 mg 1 tablet twice a day. Potassium 20 mEq 1 tablet daily.   Labs:  01/27/2018 Creatinine 1.22, BUN 22, Potassium 5.3, Sodium 135, GFR 46-53 08/05/2017 Creatinine 1.12, BUN 22, Potassium 4.2, Sodium 140, GFR 51-59 01/18/2017 Creatinine 0.91, BUN 6, Potassium 4.6, Sodium 140, GFR 66-76  Recommendations:  No changes and encouraged to call if experiencing any fluid symptoms.  Follow-up plan: ICM clinic phone appointment on 11/24/2018.   91 day device clinic remote transmission 01/16/2019.    Copy of ICM check sent to Dr. Rayann Heman.   3 month ICM trend: 10/20/2018    1 Year ICM trend:       [name], RN 10/20/2018 1:52 PM

## 2018-10-29 NOTE — Progress Notes (Signed)
Remote ICD transmission.   

## 2018-11-03 ENCOUNTER — Other Ambulatory Visit: Payer: Self-pay

## 2018-11-03 ENCOUNTER — Ambulatory Visit (INDEPENDENT_AMBULATORY_CARE_PROVIDER_SITE_OTHER): Payer: Medicare HMO | Admitting: Bariatrics

## 2018-11-03 ENCOUNTER — Encounter (INDEPENDENT_AMBULATORY_CARE_PROVIDER_SITE_OTHER): Payer: Self-pay | Admitting: Bariatrics

## 2018-11-03 ENCOUNTER — Encounter: Payer: Self-pay | Admitting: Bariatrics

## 2018-11-03 VITALS — BP 113/60 | HR 64 | Temp 98.6°F | Ht 63.0 in | Wt 235.0 lb

## 2018-11-03 DIAGNOSIS — E7849 Other hyperlipidemia: Secondary | ICD-10-CM

## 2018-11-03 DIAGNOSIS — Z1331 Encounter for screening for depression: Secondary | ICD-10-CM

## 2018-11-03 DIAGNOSIS — R0602 Shortness of breath: Secondary | ICD-10-CM

## 2018-11-03 DIAGNOSIS — I10 Essential (primary) hypertension: Secondary | ICD-10-CM | POA: Diagnosis not present

## 2018-11-03 DIAGNOSIS — Z6841 Body Mass Index (BMI) 40.0 and over, adult: Secondary | ICD-10-CM

## 2018-11-03 DIAGNOSIS — E559 Vitamin D deficiency, unspecified: Secondary | ICD-10-CM

## 2018-11-03 DIAGNOSIS — Z0289 Encounter for other administrative examinations: Secondary | ICD-10-CM

## 2018-11-03 DIAGNOSIS — E66813 Obesity, class 3: Secondary | ICD-10-CM

## 2018-11-03 DIAGNOSIS — R5383 Other fatigue: Secondary | ICD-10-CM

## 2018-11-03 DIAGNOSIS — I255 Ischemic cardiomyopathy: Secondary | ICD-10-CM

## 2018-11-03 DIAGNOSIS — E538 Deficiency of other specified B group vitamins: Secondary | ICD-10-CM

## 2018-11-03 DIAGNOSIS — E119 Type 2 diabetes mellitus without complications: Secondary | ICD-10-CM

## 2018-11-03 NOTE — Progress Notes (Signed)
.  Office: [phone]  /  Fax: [phone]   HPI:   Chief Complaint: OBESITY  [name] (MR# [id number]) is a 68 y.o. female who presents on 11/03/2018 for obesity evaluation and treatment. Current BMI is Body mass index is 41.63 kg/m.Marland Kitchen [name] has struggled with obesity for years and has been unsuccessful in either losing weight or maintaining long term weight loss. [name] attended our information session and states she is currently in the action stage of change and ready to dedicate time achieving and maintaining a healthier weight.   [name] sometimes has problems with portion control. She does crave sweets.  [name] states her desired weight loss is 55 lbs she started gaining weight after the birth of her 4th child her heaviest weight ever was 264 lbs. she sometimes is a picky eater and doesn't like to eat healthier foods  she has significant food cravings issues  she skips dinner frequently she is frequently drinking liquids with calories she frequently makes poor food choices she sometimes eats larger portions than normal  she struggles with emotional eating    Fatigue [name] feels her energy is lower than it should be. This has worsened with weight gain and has not worsened recently. Holiday denies daytime somnolence and  admits to waking up still tired. Patient generally gets 5 or 6 hours of sleep per night, and states they generally do not sleep well most nights. Apneic episodes are not present. Epworth Sleepiness Score is 4.  Dyspnea on exertion [name] notes increasing shortness of breath with exercising and seems to be worsening over time with weight gain. She notes getting out of breath sooner with activity than she used to. This has not gotten worse recently. [name] denies orthopnea.  Ischemic Cardiomyopathy/CAD Lilith has a diagnosis of ischemic cardiomyopathy/CAD. She has a longstanding history of left bundle branch block and has a defibrillator. Her EKG today showed left  bundle branch block.   Hypertension Lashawnta L Guenette is a 68 y.o. female with hypertension.  Quanita L Pitre denies chest pain or shortness of breath on exertion. She is working weight loss to help control her blood pressure with the goal of decreasing her risk of heart attack and stroke. [name]'s blood pressure is well controlled.  Diabetes II Brithany has a diagnosis of diabetes type II and is taking glipizide and NovoLog 25 in the a.m. and 25 in the p.m. [name] reports lows of 35 at night. Last A1c was 7.8 on 10/02/2015. She has been working on intensive lifestyle modifications including diet, exercise, and weight loss to help control her blood glucose levels.  Hyperlipidemia with Diabetes Mellitus II [name] has hyperlipidemia with diabetes II and has been trying to improve her cholesterol levels with intensive lifestyle modification including a low saturated fat diet, exercise and weight loss. She is taking Crestor and denies any chest pain, claudication or myalgias.  Vitamin D deficiency [name] has a diagnosis of Vitamin D deficiency. She is currently not taking Vit D and denies nausea, vomiting or muscle weakness.  Vitamin B12 deficiency [name] has a diagnosis of B12 insufficiency and reports paresthesias in her feet. She states she took injections in the past for several years.  Depression Screen [name]'s Food and Mood (modified PHQ-9) score was 14. Depression screen [facility] 2/9 11/03/2018  Decreased Interest 3  Down, Depressed, Hopeless 1  PHQ - 2 Score 4  Altered sleeping 0  Tired, decreased energy 2  Change in appetite 1  Feeling bad or failure about yourself  2  Trouble concentrating 3  Moving slowly or fidgety/restless 2  Suicidal thoughts 0  PHQ-9 Score 14  Difficult doing work/chores Not difficult at all  Some recent data might be hidden    ASSESSMENT AND PLAN:  Other fatigue - Plan: EKG 12-Lead  Shortness of breath on exertion  Essential hypertension  Vitamin D deficiency -  Plan: VITAMIN D 25 Hydroxy (Vit-D Deficiency, Fractures)  B12 nutritional deficiency - Plan: Vitamin B12  Type 2 diabetes mellitus without complication, without long-term current use of insulin (HCC) - Plan: Hemoglobin A1c, Comprehensive metabolic panel  Ischemic cardiomyopathy  Other hyperlipidemia - Plan: Lipid Panel With LDL/HDL Ratio  Depression screening  Class 3 severe obesity with serious comorbidity and body mass index (BMI) of 40.0 to 44.9 in adult, unspecified obesity type (HCC)  PLAN:  Fatigue Kindal was informed that her fatigue may be related to obesity, depression or many other causes. Labs will be ordered, and in the meanwhile [name] has agreed to work on diet, exercise and weight loss to help with fatigue. Proper sleep hygiene was discussed including the need for 7-8 hours of quality sleep each night. A sleep study was not ordered based on symptoms and Epworth score. Laycie will gradually increase activity/exercise. IC test performed today.  Dyspnea on exertion Nyeemah's shortness of breath appears to be obesity related and exercise induced. She has agreed to work on weight loss and gradually increase exercise to treat her exercise induced shortness of breath. If Wanna follows our instructions and loses weight without improvement of her shortness of breath, we will plan to refer to pulmonology. We will monitor this condition regularly. Jerri agrees to this plan.  Ischemic Cardiomyopathy/CAD [name] will continue her medications and follow-up as directed. She sees her cardiologist every 6 months.   Hypertension We discussed sodium restriction, working on healthy weight loss, and a regular exercise program as the means to achieve improved blood pressure control. [name] agreed with this plan and agreed to follow up as directed. We will continue to monitor her blood pressure as well as her progress with the above lifestyle modifications. She will continue her medications as prescribed  and will watch for signs of hypotension as she continues her lifestyle modifications.  Diabetes II [name] has been given extensive diabetes education by myself today including ideal fasting and post-prandial blood glucose readings, individual ideal HgA1c goals  and hypoglycemia prevention. We discussed the importance of good blood sugar control to decrease the likelihood of diabetic complications such as nephropathy, neuropathy, limb loss, blindness, coronary artery disease, and death. We discussed the importance of intensive lifestyle modification including diet, exercise and weight loss as the first line treatment for diabetes. [name] will have A1c checked today. She will lower NovoLog to 15 units in the a.m. and 15 units in the p.m. and will continue Levemir 15 units as directed.  Hyperlipidemia with Diabetes Mellitus II [name] was informed of the American Heart Association Guidelines emphasizing intensive lifestyle modifications as the first line treatment for hyperlipidemia. We discussed many lifestyle modifications today in depth, and [name] will continue to work on decreasing saturated fats such as fatty red meat, butter and many fried foods. [name] will continue her medications. She will also increase vegetables and lean protein in her diet and continue to work on exercise and weight loss efforts.  Vitamin D Deficiency [name] was informed that low Vitamin D levels contributes to fatigue and are associated with obesity, breast, and colon cancer. She will have routine testing  of Vitamin D today and follow-up with our clinic in 2 weeks.  Vitamin B12 deficiency [name] will have B12 level checked today and will follow-up as directed to review lab results.  Depression Screen [name] had a moderately positive depression screening. Depression is commonly associated with obesity and often results in emotional eating behaviors. We will monitor this closely and work on CBT to help improve the non-hunger eating  patterns. Referral to Psychology may be required if no improvement is seen as she continues in our clinic.  Obesity [name] is currently in the action stage of change and her goal is to continue with weight loss efforts. She has agreed to follow the Category 2 plan with additional Category 1 and Category 2 options. [name] will stop all sugary drinks, work on meal planning and intentional eating. [name] has been instructed to work up to a goal of 150 minutes of combined cardio and strengthening exercise per week for weight loss and overall health benefits. We discussed the following Behavioral Modification Strategies today: increasing lean protein intake, decreasing simple carbohydrates, increasing vegetables, increase H20 intake, decrease eating out, no skipping meals, work on meal planning and easy cooking plans, keeping healthy foods in the home, and planning for success.  [name] has agreed to follow-up with our clinic in 2 weeks. She was informed of the importance of frequent follow-up visits to maximize her success with intensive lifestyle modifications for her multiple health conditions. She was informed we would discuss her lab results at her next visit unless there is a critical issue that needs to be addressed sooner. Jalayah agreed to keep her next visit at the agreed upon time to discuss these results.  ALLERGIES: Allergies  Allergen Reactions  . Lipitor  [Atorvastatin Calcium] Rash  . Metformin And Related Itching, Swelling and Other (See Comments)    Leg pain & swelling in legs  . Atorvastatin Itching and Rash  . Levofloxacin Itching    MEDICATIONS: Current Outpatient Medications on File Prior to Visit  Medication Sig Dispense Refill  . albuterol (PROVENTIL HFA;VENTOLIN HFA) 108 (90 BASE) MCG/ACT inhaler Inhale 2 puffs into the lungs every 6 (six) hours as needed for wheezing or shortness of breath.    Marland Kitchen albuterol (PROVENTIL) (2.5 MG/3ML) 0.083% nebulizer solution Take 2.5 mg by  nebulization every 6 (six) hours as needed for wheezing or shortness of breath.     Marland Kitchen amitriptyline (ELAVIL) 10 MG tablet Take 10 mg by mouth daily.    . Capsaicin (MUSCLE RELIEF EX) Apply 1 application topically 3 (three) times daily as needed. For pain    . carvedilol (COREG) 12.5 MG tablet Take 12.5 mg by mouth 2 (two) times daily with a meal.     . ferrous sulfate 300 (60 Fe) MG/5ML syrup Take 300 mg by mouth daily with lunch.    . furosemide (LASIX) 80 MG tablet TAKE 1 TABLET TWICE DAILY 180 tablet 1  . glipiZIDE (GLUCOTROL) 5 MG tablet Take 5 mg by mouth daily before breakfast.     . ibuprofen (ADVIL,MOTRIN) 200 MG tablet Take 200 mg by mouth every 8 (eight) hours as needed (for pain).    . isosorbide mononitrate (IMDUR) 30 MG 24 hr tablet TAKE 1 TABLET (30 MG TOTAL) BY MOUTH DAILY. 90 tablet 2  . ketoconazole (NIZORAL) 2 % cream Apply 1 application topically 2 (two) times daily as needed (for skin irritation).     Marland Kitchen latanoprost (XALATAN) 0.005 % ophthalmic solution Place 1 drop into both eyes at  bedtime.    Marland Kitchen LEVEMIR FLEXTOUCH 100 UNIT/ML Pen Inject 15-20 Units into the skin at bedtime. Patient states she takes every other day in the pm.  Patient takes 15-20 units if glucose is greater than 140 per patient    . lisinopril (PRINIVIL,ZESTRIL) 10 MG tablet Take 10 mg by mouth daily.     . methocarbamol (ROBAXIN) 500 MG tablet Take 1 tablet (500 mg total) by mouth every 8 (eight) hours as needed for muscle spasms. 45 tablet 0  . nitroGLYCERIN (NITROSTAT) 0.4 MG SL tablet Place 0.4 mg under the tongue every 5 (five) minutes as needed for chest pain.    Marland Kitchen NOVOLOG FLEXPEN 100 UNIT/ML FlexPen Inject 30-50 Units into the skin See admin instructions. 50 units every morning, 25 units at lunch and 50 units at supper *Hold for low blood sugar*    . oxybutynin (DITROPAN-XL) 10 MG 24 hr tablet Take 10 mg by mouth daily.    . Potassium Chloride ER 20 MEQ TBCR Take 20 mEq by mouth daily.    . rosuvastatin  (CRESTOR) 20 MG tablet Take 1 tablet (20 mg total) by mouth daily at 6 PM. 30 tablet 2  . spironolactone (ALDACTONE) 25 MG tablet Take 25 mg by mouth daily.    Marland Kitchen tolterodine (DETROL LA) 4 MG 24 hr capsule Take 4 mg by mouth daily.    . traMADol (ULTRAM) 50 MG tablet Take 50 mg by mouth 2 (two) times daily.    Alveda Reasons 20 MG TABS tablet Take 20 mg by mouth daily.      No current facility-administered medications on file prior to visit.     PAST MEDICAL HISTORY: Past Medical History:  Diagnosis Date  . AICD (automatic cardioverter/defibrillator) present   . Angina decubitus ([place]) 11/06/2012  . Angina pectoris ([place])   . Anxiety   . Arthritis   . Back pain   . Bundle branch block 05/2016  . CHF (congestive heart failure) ([place])   . Chronic combined systolic and diastolic CHF, NYHA class 3 ([place])   . Coronary artery disease    s/p CABG 2007  . Depression   . Dermal mycosis   . Diabetes mellitus    type II  . DVT (deep venous thrombosis) ([place]) 09/2015   bilateral  . Edema, lower extremity   . Glaucoma   . Hyperlipidemia   . Hypertension   . Insomnia   . Ischemic cardiomyopathy    EF 30-35%, s/p ICD 4/08 by Dr Leonia Reeves  . Joint pain   . Morbid obesity ([place]) 11/06/2012  . Obesity   . Oxygen dependent 2 L  . Pain in left knee   . Peripheral neuropathy   . 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 Sleep apnea    uses O2 at night  . SOB (shortness of breath)   . Tinea   . Ventricular tachycardia ([place]) 01/16/15   sustained VT terminated with ATP, CL 340 msec    PAST SURGICAL HISTORY: Past Surgical History:  Procedure Laterality Date  . BI-VENTRICULAR IMPLANTABLE CARDIOVERTER DEFIBRILLATOR UPGRADE N/A 09/25/2012   Procedure: BI-VENTRICULAR IMPLANTABLE CARDIOVERTER DEFIBRILLATOR UPGRADE;  Surgeon: Coralyn Mark, MD;  Location: [facility] CATH LAB;  Service: Cardiovascular;  Laterality: N/A;  . BREAST  BIOPSY Right 04/05/2014  . CARDIAC CATHETERIZATION    . CARDIAC DEFIBRILLATOR PLACEMENT  4/08   by  Dr Leonia Reeves (MDT)  . COLONOSCOPY WITH PROPOFOL N/A 10/12/2016   Procedure: COLONOSCOPY WITH PROPOFOL;  Surgeon: Doran Stabler, MD;  Location: [facility] ENDOSCOPY;  Service: Gastroenterology;  Laterality: N/A;  . CORONARY ARTERY BYPASS GRAFT  10/05/2005   by Dr Cyndia Bent  . IMPLANTABLE CARDIOVERTER DEFIBRILLATOR IMPLANT  09/25/12   attempt of upgrade to CRT-D unsuccessful due to CS anatomy, SJM Unify Asaura device placed with LV port capped by Dr Rayann Heman  . IR GENERIC HISTORICAL  10/03/2015   IR US GUIDE VASC ACCESS LEFT 10/03/2015 Jacqulynn Cadet, MD [facility]  . IR GENERIC HISTORICAL  10/03/2015   IR VENO/EXT/UNI LEFT 10/03/2015 Jacqulynn Cadet, MD [facility]  . IR GENERIC HISTORICAL  10/03/2015   IR VENOCAVAGRAM IVC 10/03/2015 Jacqulynn Cadet, MD [facility]  . IR GENERIC HISTORICAL  10/03/2015   IR INFUSION THROMBOL VENOUS INITIAL (MS) 10/03/2015 Jacqulynn Cadet, MD [facility]  . IR GENERIC HISTORICAL  10/03/2015   IR US GUIDE VASC ACCESS LEFT 10/03/2015 Jacqulynn Cadet, MD [facility]  . IR GENERIC HISTORICAL  10/04/2015   IR THROMBECT VENO MECH MOD SED 10/04/2015 Sandi Mariscal, MD [facility]  . IR GENERIC HISTORICAL  10/04/2015   IR TRANSCATH PLC STENT 1ST ART NOT LE CV CAR VERT CAR 10/04/2015 Sandi Mariscal, MD [facility]  . IR GENERIC HISTORICAL  10/04/2015   IR THROMB F/U EVAL ART/VEN FINAL DAY (MS) 10/04/2015 Sandi Mariscal, MD [facility]  . IR GENERIC HISTORICAL  11/02/2015   IR RADIOLOGIST EVAL & MGMT 11/02/2015 Sandi Mariscal, MD [facility]  . IR RADIOLOGIST EVAL & MGMT  04/26/2016    SOCIAL HISTORY: Social History   Tobacco Use  . Smoking status: Never Smoker  . Smokeless tobacco: Never Used  Substance Use Topics  . Alcohol use: No  . Drug use: No    FAMILY HISTORY: Family History  Problem Relation Age of Onset  . Diabetes Mother 41       died - HTN  . Stroke Mother    . High blood pressure Mother   . Sudden death Mother   . Obesity Mother   . Other Other        No early family hx of CAD   ROS: Review of Systems  Constitutional: Positive for malaise/fatigue.  HENT:       Positive for dentures.  Eyes:       Positive for vision changes. Positive for wearing glasses or contacts.  Respiratory: Positive for shortness of breath (with activity) and wheezing.   Cardiovascular: Negative for chest pain, orthopnea and claudication.       Positive for ischemic cardiomyopathy/CAD. Positive for left bundle branch block. Positive for chest tightness.  Gastrointestinal: Negative for nausea and vomiting.  Genitourinary:       Positive for frequent urination.  Musculoskeletal: Positive for back pain and joint pain. Negative for myalgias.       Negative for muscle weakness. Positive for calf/leg pain with walking. Positive for leg cramping.  Skin: Positive for itching.       Positive for hair or nail changes.  Psychiatric/Behavioral: The patient has insomnia.    PHYSICAL EXAM: Blood pressure 113/60, pulse 64, temperature 98.6 F (37 C), height 5\' 3"  (1.6 m), weight 235 lb (106.6 kg), SpO2 100 %. Body mass index is 41.63 kg/m. Physical Exam Vitals signs reviewed.  Constitutional:      Appearance: Normal appearance. She is well-developed. She is obese.  HENT:     Head:  Normocephalic and atraumatic.     Nose: Nose normal.  Eyes:     General: No scleral icterus. Neck:     Musculoskeletal: Normal range of motion.  Cardiovascular:     Rate and Rhythm: Normal rate and regular rhythm.  Pulmonary:     Effort: Pulmonary effort is normal. No respiratory distress.  Abdominal:     Palpations: Abdomen is soft.     Tenderness: There is no abdominal tenderness.  Musculoskeletal: Normal range of motion.     Comments: Range of motion normal in all four extremities.  Skin:    General: Skin is warm and dry.  Neurological:     Mental Status: She is alert and  oriented to person, place, and time.     Coordination: Coordination normal.     Comments: Using walker for ambulation.  Psychiatric:        Mood and Affect: Mood and affect normal.        Behavior: Behavior normal.   RECENT LABS AND TESTS: BMET    Component Value Date/Time   NA 135 01/31/2018 1029   K 5.3 (H) 01/31/2018 1029   CL 97 01/31/2018 1029   CO2 20 01/31/2018 1029   GLUCOSE 147 (H) 01/31/2018 1029   GLUCOSE 178 (H) 10/07/2015 0134   BUN 22 01/31/2018 1029   CREATININE 1.22 (H) 01/31/2018 1029   CREATININE 0.88 09/20/2015 1153   CALCIUM 9.9 01/31/2018 1029   GFRNONAA 46 (L) 01/31/2018 1029   GFRAA 53 (L) 01/31/2018 1029   Lab Results  Component Value Date   HGBA1C 7.8 (H) 10/02/2015   No results found for: INSULIN CBC    Component Value Date/Time   WBC 4.4 01/31/2018 1029   WBC 5.6 10/07/2015 0715   RBC 3.27 (L) 01/31/2018 1029   RBC 3.71 (L) 10/07/2015 0715   HGB 11.8 01/31/2018 1029   HCT 34.0 01/31/2018 1029   PLT 173 01/31/2018 1029   MCV 104 (H) 01/31/2018 1029   MCH 36.1 (H) 01/31/2018 1029   MCH 29.1 10/07/2015 0715   MCHC 34.7 01/31/2018 1029   MCHC 31.5 10/07/2015 0715   RDW 16.2 (H) 01/31/2018 1029   LYMPHSABS 1.9 05/26/2014 0723   MONOABS 0.7 05/26/2014 0723   EOSABS 0.2 05/26/2014 0723   BASOSABS 0.0 05/26/2014 0723   Iron/TIBC/Ferritin/ %Sat No results found for: IRON, TIBC, FERRITIN, IRONPCTSAT Lipid Panel     Component Value Date/Time   CHOL 116 08/05/2017 1048   CHOL 90 12/22/2012 1042   TRIG 94 08/05/2017 1048   TRIG 85 12/22/2012 1042   HDL 32 (L) 08/05/2017 1048   HDL 33 (L) 12/22/2012 1042   CHOLHDL 3.6 08/05/2017 1048   CHOLHDL 4.2 04/25/2012 0520   VLDL 23 04/25/2012 0520   LDLCALC 65 08/05/2017 1048   LDLCALC 40 12/22/2012 1042   Hepatic Function Panel     Component Value Date/Time   PROT 7.5 08/05/2017 1048   ALBUMIN 4.1 08/05/2017 1048   AST 19 08/05/2017 1048   ALT 21 08/05/2017 1048   ALKPHOS 91 08/05/2017  1048   BILITOT 0.7 08/05/2017 1048   BILIDIR 0.19 08/05/2017 1048   IBILI 0.5 04/23/2012 1924      Component Value Date/Time   TSH 1.786 03/12/2015 0217   TSH 1.071 05/27/2014 1058   No results found for: Vitamin D, 25-Hydroxy  ECG  shows sinus rhythm with a rate of 62 BPM. Left bundle branch block. Abnormal.   INDIRECT CALORIMETER done today shows a VO2  of 209 and a REE of 1456. Her calculated basal metabolic rate is [id number] thus her basal metabolic rate is worse than expected.  OBESITY BEHAVIORAL INTERVENTION VISIT  Today's visit was #1  Starting weight: 235 lbs Starting date: 11/03/2018 Today's weight: 235 lbs  Today's date: 11/03/2018 Total lbs lost to date: 0 At least 15 minutes were spent on discussing the following behavioral intervention visit.    11/03/2018  Height 5\' 3"  (1.6 m)  Weight 235 lb (106.6 kg)  BMI (Calculated) 41.64  BLOOD PRESSURE - SYSTOLIC [id number]  BLOOD PRESSURE - DIASTOLIC 60  Waist Measurement  57 inches   Body Fat % 56 %  RMR 1456   ASK: We discussed the diagnosis of obesity with Laneka L Damman today and [place] agreed to give [name] permission to discuss obesity behavioral modification therapy today.  ASSESS: Licet has the diagnosis of obesity and her BMI today is 41.7. Josy is in the action stage of change.   ADVISE: Maggy was educated on the multiple health risks of obesity as well as the benefit of weight loss to improve her health. She was advised of the need for long term treatment and the importance of lifestyle modifications to improve her current health and to decrease her risk of future health problems.  AGREE: Multiple dietary modification options and treatment options were discussed and  Fariha agreed to follow the recommendations documented in the above note.  ARRANGE: Almedia was educated on the importance of frequent visits to treat obesity as outlined per CMS and USPSTF guidelines and agreed to schedule her next follow up appointment  today.   Migdalia Dk, am acting as Location manager for [employer], DO   I have reviewed the above documentation for accuracy and completeness, and I agree with the above. -Jearld Lesch, DO

## 2018-11-04 LAB — COMPREHENSIVE METABOLIC PANEL
ALT: 16 IU/L (ref 0–32)
AST: 20 IU/L (ref 0–40)
Albumin/Globulin Ratio: 1.2 (ref 1.2–2.2)
Albumin: 4.2 g/dL (ref 3.8–4.8)
Alkaline Phosphatase: 88 IU/L (ref 39–117)
BUN/Creatinine Ratio: 18 (ref 12–28)
BUN: 32 mg/dL — ABNORMAL HIGH (ref 8–27)
Bilirubin Total: 0.5 mg/dL (ref 0.0–1.2)
CO2: 23 mmol/L (ref 20–29)
Calcium: 9.7 mg/dL (ref 8.7–10.3)
Chloride: 97 mmol/L (ref 96–106)
Creatinine, Ser: 1.79 mg/dL — ABNORMAL HIGH (ref 0.57–1.00)
GFR calc Af Amer: 33 mL/min/{1.73_m2} — ABNORMAL LOW (ref >59)
GFR calc non Af Amer: 29 mL/min/{1.73_m2} — ABNORMAL LOW (ref >59)
Globulin, Total: 3.4 g/dL (ref 1.5–4.5)
Glucose: 44 mg/dL — ABNORMAL LOW (ref 65–99)
Potassium: 4.7 mmol/L (ref 3.5–5.2)
Sodium: 135 mmol/L (ref 134–144)
Total Protein: 7.6 g/dL (ref 6.0–8.5)

## 2018-11-04 LAB — LIPID PANEL WITH LDL/HDL RATIO
Cholesterol, Total: 111 mg/dL (ref 100–199)
HDL: 30 mg/dL — ABNORMAL LOW (ref >39)
LDL Chol Calc (NIH): 64 mg/dL (ref 0–99)
LDL/HDL Ratio: 2.1 ratio (ref 0.0–3.2)
Triglycerides: 87 mg/dL (ref 0–149)
VLDL Cholesterol Cal: 17 mg/dL (ref 5–40)

## 2018-11-04 LAB — HEMOGLOBIN A1C
Est. average glucose Bld gHb Est-mCnc: 189 mg/dL
Hgb A1c MFr Bld: 8.2 % — ABNORMAL HIGH (ref 4.8–5.6)

## 2018-11-04 LAB — VITAMIN B12: Vitamin B-12: 102 pg/mL — ABNORMAL LOW (ref 232–1245)

## 2018-11-04 LAB — VITAMIN D 25 HYDROXY (VIT D DEFICIENCY, FRACTURES): Vit D, 25-Hydroxy: 18.3 ng/mL — ABNORMAL LOW (ref 30.0–100.0)

## 2018-11-05 ENCOUNTER — Encounter (INDEPENDENT_AMBULATORY_CARE_PROVIDER_SITE_OTHER): Payer: Self-pay | Admitting: Bariatrics

## 2018-11-05 DIAGNOSIS — E538 Deficiency of other specified B group vitamins: Secondary | ICD-10-CM

## 2018-11-05 DIAGNOSIS — E559 Vitamin D deficiency, unspecified: Secondary | ICD-10-CM

## 2018-11-05 HISTORY — DX: Deficiency of other specified B group vitamins: E53.8

## 2018-11-05 HISTORY — DX: Vitamin D deficiency, unspecified: E55.9

## 2018-11-05 NOTE — Progress Notes (Signed)
Patient was informed and  she repeated instructions back to me.

## 2018-11-17 ENCOUNTER — Ambulatory Visit (INDEPENDENT_AMBULATORY_CARE_PROVIDER_SITE_OTHER): Payer: Medicare HMO | Admitting: Bariatrics

## 2018-11-17 ENCOUNTER — Other Ambulatory Visit: Payer: Self-pay

## 2018-11-17 VITALS — BP 125/63 | HR 81 | Temp 98.8°F | Ht 63.0 in | Wt 245.0 lb

## 2018-11-17 DIAGNOSIS — N1832 Chronic kidney disease, stage 3b: Secondary | ICD-10-CM | POA: Diagnosis not present

## 2018-11-17 DIAGNOSIS — E1121 Type 2 diabetes mellitus with diabetic nephropathy: Secondary | ICD-10-CM

## 2018-11-17 DIAGNOSIS — E559 Vitamin D deficiency, unspecified: Secondary | ICD-10-CM

## 2018-11-17 DIAGNOSIS — E538 Deficiency of other specified B group vitamins: Secondary | ICD-10-CM | POA: Diagnosis not present

## 2018-11-17 DIAGNOSIS — Z6841 Body Mass Index (BMI) 40.0 and over, adult: Secondary | ICD-10-CM

## 2018-11-17 DIAGNOSIS — Z794 Long term (current) use of insulin: Secondary | ICD-10-CM

## 2018-11-17 MED ORDER — VITAMIN D (ERGOCALCIFEROL) 1.25 MG (50000 UNIT) PO CAPS: 50000.0000 [IU] | ORAL_CAPSULE | ORAL | 0 refills | Status: DC

## 2018-11-17 NOTE — Progress Notes (Signed)
Office: [phone]  /  Fax: [phone]   HPI:   Chief Complaint: OBESITY [name] is here to discuss her progress with her obesity treatment plan. She is on the Category 2 plan with additional Category 1 and Category 2 options and is following her eating plan approximately 80 % of the time. She states she is exercising 0 minutes 0 times per week. [name] is up 10 lbs and denies extra fluid. She notes shortness of breath but no more than normal. She is taking Lasix.  Her weight is 245 lb (111.1 kg) today and has gained 10 lbs since her last visit. She has lost 0 lbs since starting treatment with [name].  Diabetes II [name] has a diagnosis of diabetes type II. [name] states her fasting BGs varies widely between 116 and 200's. Her 2 hour post prandials range in 200's, and her GFR is 33.  She states her lowest BGs range in 90's. She denies hypoglycemia. Last A1c was 8.2. She has been working on intensive lifestyle modifications including diet, exercise, and weight loss to help control her blood glucose levels.  Vitamin D Deficiency [name] has a diagnosis of vitamin D deficiency. She is not currently taking Vit D. Last Vit D level was 18.2. She denies nausea, vomiting or muscle weakness.  Vitamin B12 Deficiency [name] has a diagnosis of B12 insufficiency. A phone call was made to [facility] to start B12. Last B12 level was 102.   Chronic Kidney Disease, Stage 3B [name] has a diagnosis of chronic kidney disease, stage 3b. She occasionally take ibuprofen, and she take Tramadol.  ASSESSMENT AND PLAN:  Type 2 diabetes mellitus with stage 3b chronic kidney disease, with long-term current use of insulin ([place]) - Plan: Brain natriuretic peptide  Vitamin D deficiency - Plan: Vitamin D, Ergocalciferol, (DRISDOL) 1.25 MG (50000 UT) CAPS capsule  B12 nutritional deficiency  Stage 3b chronic kidney disease - Plan: Brain natriuretic peptide  Class 3 severe obesity with serious comorbidity and body mass index (BMI)  of 40.0 to 44.9 in adult, unspecified obesity type ([name])  PLAN:  Diabetes II [name] has been given extensive diabetes education by myself today including ideal fasting and post-prandial blood glucose readings, individual ideal Hgb A1c goals and hypoglycemia prevention. We discussed the importance of good blood sugar control to decrease the likelihood of diabetic complications such as nephropathy, neuropathy, limb loss, blindness, coronary artery disease, and death. We discussed the importance of intensive lifestyle modification including diet, exercise and weight loss as the first line treatment for diabetes. [name] agrees to continue her diabetes medications and she is to get glucose pills. [name] agrees to follow up with our clinic in 2 to 3 weeks.  Vitamin D Deficiency [name] was informed that low vitamin D levels contributes to fatigue and are associated with obesity, breast, and colon cancer. [name] agrees to start prescription Vit D 50,000 IU every week #4 with no refills. She will follow up for routine testing of vitamin D, at least 2-3 times per year. She was informed of the risk of over-replacement of vitamin D and agrees to not increase her dose unless she discusses this with [name] first. [name] agrees to follow up with our clinic in 2 to 3 weeks.  Vitamin B12 Deficiency [name] will work on increasing B12 rich foods in her diet. She has started B12 OTC at 1,000 mcg daily, and she agrees to continue. We will recheck labs in about 6 weeks. [name] agrees to follow up with our clinic in  2 to 3 weeks.  Chronic Kidney Disease, Stage 3B [name] is to discontinue ibuprofen, and increase water to about 64 oz daily but to to exceed over. We will recheck in the future.   Obesity [name] is currently in the action stage of change. As such, her goal is to continue with weight loss efforts She has agreed to follow the Category 2 plan [name] has been instructed to work up to a goal of 150 minutes of combined cardio  and strengthening exercise per week for weight loss and overall health benefits. We discussed the following Behavioral Modification Strategies today: increasing lean protein intake, decreasing simple carbohydrates, increasing vegetables, decrease eating out, increase H20 intake, no skipping meals, work on meal planning and easy cooking plans, and keeping healthy foods in the home We will check BNP. [name] is to try to get "Meals on Wheels" (no white, no potatoes, no extra bread, or dessert).  [name] has agreed to follow up with our clinic in 2 to 3 weeks. She was informed of the importance of frequent follow up visits to maximize her success with intensive lifestyle modifications for her multiple health conditions.  ALLERGIES: Allergies  Allergen Reactions  . Lipitor  [Atorvastatin Calcium] Rash  . Metformin And Related Itching, Swelling and Other (See Comments)    Leg pain & swelling in legs  . Atorvastatin Itching and Rash  . Levofloxacin Itching    MEDICATIONS: Current Outpatient Medications on File Prior to Visit  Medication Sig Dispense Refill  . albuterol (PROVENTIL HFA;VENTOLIN HFA) 108 (90 BASE) MCG/ACT inhaler Inhale 2 puffs into the lungs every 6 (six) hours as needed for wheezing or shortness of breath.    Marland Kitchen albuterol (PROVENTIL) (2.5 MG/3ML) 0.083% nebulizer solution Take 2.5 mg by nebulization every 6 (six) hours as needed for wheezing or shortness of breath.     Marland Kitchen amitriptyline (ELAVIL) 10 MG tablet Take 10 mg by mouth daily.    . Capsaicin (MUSCLE RELIEF EX) Apply 1 application topically 3 (three) times daily as needed. For pain    . carvedilol (COREG) 12.5 MG tablet Take 12.5 mg by mouth 2 (two) times daily with a meal.     . ferrous sulfate 300 (60 Fe) MG/5ML syrup Take 300 mg by mouth daily with lunch.    . furosemide (LASIX) 80 MG tablet TAKE 1 TABLET TWICE DAILY 180 tablet 1  . glipiZIDE (GLUCOTROL) 5 MG tablet Take 5 mg by mouth daily before breakfast.     . ibuprofen  (ADVIL,MOTRIN) 200 MG tablet Take 200 mg by mouth every 8 (eight) hours as needed (for pain).    . isosorbide mononitrate (IMDUR) 30 MG 24 hr tablet TAKE 1 TABLET (30 MG TOTAL) BY MOUTH DAILY. 90 tablet 2  . ketoconazole (NIZORAL) 2 % cream Apply 1 application topically 2 (two) times daily as needed (for skin irritation).     Marland Kitchen latanoprost (XALATAN) 0.005 % ophthalmic solution Place 1 drop into both eyes at bedtime.    Marland Kitchen LEVEMIR FLEXTOUCH 100 UNIT/ML Pen Inject 15-20 Units into the skin at bedtime. Patient states she takes every other day in the pm.  Patient takes 15-20 units if glucose is greater than 140 per patient    . lisinopril (PRINIVIL,ZESTRIL) 10 MG tablet Take 10 mg by mouth daily.     . methocarbamol (ROBAXIN) 500 MG tablet Take 1 tablet (500 mg total) by mouth every 8 (eight) hours as needed for muscle spasms. 45 tablet 0  . nitroGLYCERIN (NITROSTAT) 0.4  MG SL tablet Place 0.4 mg under the tongue every 5 (five) minutes as needed for chest pain.    Marland Kitchen NOVOLOG FLEXPEN 100 UNIT/ML FlexPen Inject 30-50 Units into the skin See admin instructions. 50 units every morning, 25 units at lunch and 50 units at supper *Hold for low blood sugar*    . oxybutynin (DITROPAN-XL) 10 MG 24 hr tablet Take 10 mg by mouth daily.    . Potassium Chloride ER 20 MEQ TBCR Take 20 mEq by mouth daily.    . rosuvastatin (CRESTOR) 20 MG tablet Take 1 tablet (20 mg total) by mouth daily at 6 PM. 30 tablet 2  . spironolactone (ALDACTONE) 25 MG tablet Take 25 mg by mouth daily.    Marland Kitchen tolterodine (DETROL LA) 4 MG 24 hr capsule Take 4 mg by mouth daily.    . traMADol (ULTRAM) 50 MG tablet Take 50 mg by mouth 2 (two) times daily.    [name] Reasons 20 MG TABS tablet Take 20 mg by mouth daily.      No current facility-administered medications on file prior to visit.     PAST MEDICAL HISTORY: Past Medical History:  Diagnosis Date  . AICD (automatic cardioverter/defibrillator) present   . Angina decubitus ([place]) 11/06/2012  .  Angina pectoris ([place])   . Anxiety   . Arthritis   . Back pain   . Bundle branch block 05/2016  . CHF (congestive heart failure) ([place])   . Chronic combined systolic and diastolic CHF, NYHA class 3 ([place])   . Coronary artery disease    s/p CABG 2007  . Depression   . Dermal mycosis   . Diabetes mellitus    type II  . DVT (deep venous thrombosis) ([place]) 09/2015   bilateral  . Edema, lower extremity   . Glaucoma   . Hyperlipidemia   . Hypertension   . Insomnia   . Ischemic cardiomyopathy    EF 30-35%, s/p ICD 4/08 by Dr Leonia Reeves  . Joint pain   . Morbid obesity ([place]) 11/06/2012  . Obesity   . Oxygen dependent 2 L  . Pain in left knee   . Peripheral neuropathy   . 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 Sleep apnea    uses O2 at night  . SOB (shortness of breath)   . Tinea   . Ventricular tachycardia ([place]) 01/16/15   sustained VT terminated with ATP, CL 340 msec    PAST SURGICAL HISTORY: Past Surgical History:  Procedure Laterality Date  . BI-VENTRICULAR IMPLANTABLE CARDIOVERTER DEFIBRILLATOR UPGRADE N/A 09/25/2012   Procedure: BI-VENTRICULAR IMPLANTABLE CARDIOVERTER DEFIBRILLATOR UPGRADE;  Surgeon: Coralyn Mark, MD;  Location: [facility] CATH LAB;  Service: Cardiovascular;  Laterality: N/A;  . BREAST BIOPSY Right 04/05/2014  . CARDIAC CATHETERIZATION    . CARDIAC DEFIBRILLATOR PLACEMENT  4/08   by Dr Leonia Reeves (MDT)  . COLONOSCOPY WITH PROPOFOL N/A 10/12/2016   Procedure: COLONOSCOPY WITH PROPOFOL;  Surgeon: Doran Stabler, MD;  Location: [facility] ENDOSCOPY;  Service: Gastroenterology;  Laterality: N/A;  . CORONARY ARTERY BYPASS GRAFT  10/05/2005   by Dr Cyndia Bent  . IMPLANTABLE CARDIOVERTER DEFIBRILLATOR IMPLANT  09/25/12   attempt of upgrade to CRT-D unsuccessful due to CS anatomy, SJM Unify Asaura device placed with LV port capped by Dr Rayann Heman  . IR GENERIC HISTORICAL  10/03/2015   IR US GUIDE VASC  ACCESS LEFT 10/03/2015 Jacqulynn Cadet, MD [facility]  . IR GENERIC HISTORICAL  10/03/2015   IR VENO/EXT/UNI LEFT 10/03/2015 Jacqulynn Cadet, MD [facility]  . IR GENERIC HISTORICAL  10/03/2015   IR VENOCAVAGRAM IVC 10/03/2015 Jacqulynn Cadet, MD [facility]  . IR GENERIC HISTORICAL  10/03/2015   IR INFUSION THROMBOL VENOUS INITIAL (MS) 10/03/2015 Jacqulynn Cadet, MD [facility]  . IR GENERIC HISTORICAL  10/03/2015   IR US GUIDE VASC ACCESS LEFT 10/03/2015 Jacqulynn Cadet, MD [facility]  . IR GENERIC HISTORICAL  10/04/2015   IR THROMBECT VENO MECH MOD SED 10/04/2015 Sandi Mariscal, MD [facility]  . IR GENERIC HISTORICAL  10/04/2015   IR TRANSCATH PLC STENT 1ST ART NOT LE CV CAR VERT CAR 10/04/2015 Sandi Mariscal, MD [facility]  . IR GENERIC HISTORICAL  10/04/2015   IR THROMB F/U EVAL ART/VEN FINAL DAY (MS) 10/04/2015 Sandi Mariscal, MD [facility]  . IR GENERIC HISTORICAL  11/02/2015   IR RADIOLOGIST EVAL & MGMT 11/02/2015 Sandi Mariscal, MD [facility]  . IR RADIOLOGIST EVAL & MGMT  04/26/2016    SOCIAL HISTORY: Social History   Tobacco Use  . Smoking status: Never Smoker  . Smokeless tobacco: Never Used  Substance Use Topics  . Alcohol use: No  . Drug use: No    FAMILY HISTORY: Family History  Problem Relation Age of Onset  . Diabetes Mother 63       died - HTN  . Stroke Mother   . High blood pressure Mother   . Sudden death Mother   . Obesity Mother   . Other Other        No early family hx of CAD    ROS: Review of Systems  Constitutional: Negative for weight loss.  Gastrointestinal: Negative for nausea and vomiting.  Musculoskeletal:       Negative muscle weakness  Endo/Heme/Allergies:       Negative hypoglycemia    PHYSICAL EXAM: Blood pressure 125/63, pulse 81, temperature 98.8 F (37.1 C), height 5\' 3"  (1.6 m), weight 245 lb (111.1 kg), SpO2 98 %. Body mass index is 43.4 kg/m. Physical Exam Vitals signs reviewed.  Constitutional:      Appearance:  Normal appearance. She is obese.  Cardiovascular:     Rate and Rhythm: Normal rate.     Pulses: Normal pulses.  Pulmonary:     Effort: Pulmonary effort is normal.     Breath sounds: Normal breath sounds.  Musculoskeletal: Normal range of motion.  Skin:    General: Skin is warm and dry.  Neurological:     Mental Status: She is alert and oriented to person, place, and time.  Psychiatric:        Mood and Affect: Mood normal.        Behavior: Behavior normal.     RECENT LABS AND TESTS: BMET    Component Value Date/Time   NA 135 11/03/2018 1205   K 4.7 11/03/2018 1205   CL 97 11/03/2018 1205   CO2 23 11/03/2018 1205   GLUCOSE 44 (L) 11/03/2018 1205   GLUCOSE 178 (H) 10/07/2015 0134   BUN 32 (H) 11/03/2018 1205   CREATININE 1.79 (H) 11/03/2018 1205   CREATININE 0.88 09/20/2015 1153   CALCIUM 9.7 11/03/2018 1205   GFRNONAA 29 (L) 11/03/2018 1205   GFRAA 33 (L) 11/03/2018 1205   Lab Results  Component Value Date   HGBA1C 8.2 (H) 11/03/2018   HGBA1C 7.8 (H) 10/02/2015   HGBA1C 8.3 (H)  10/01/2015   HGBA1C 8.7 (H) 03/12/2015   HGBA1C 8.6 (H) 05/26/2014   No results found for: INSULIN CBC    Component Value Date/Time   WBC 4.4 01/31/2018 1029   WBC 5.6 10/07/2015 0715   RBC 3.27 (L) 01/31/2018 1029   RBC 3.71 (L) 10/07/2015 0715   HGB 11.8 01/31/2018 1029   HCT 34.0 01/31/2018 1029   PLT 173 01/31/2018 1029   MCV 104 (H) 01/31/2018 1029   MCH 36.1 (H) 01/31/2018 1029   MCH 29.1 10/07/2015 0715   MCHC 34.7 01/31/2018 1029   MCHC 31.5 10/07/2015 0715   RDW 16.2 (H) 01/31/2018 1029   LYMPHSABS 1.9 05/26/2014 0723   MONOABS 0.7 05/26/2014 0723   EOSABS 0.2 05/26/2014 0723   BASOSABS 0.0 05/26/2014 0723   Iron/TIBC/Ferritin/ %Sat No results found for: IRON, TIBC, FERRITIN, IRONPCTSAT Lipid Panel     Component Value Date/Time   CHOL 111 11/03/2018 1205   CHOL 90 12/22/2012 1042   TRIG 87 11/03/2018 1205   TRIG 85 12/22/2012 1042   HDL 30 (L) 11/03/2018 1205    HDL 33 (L) 12/22/2012 1042   CHOLHDL 3.6 08/05/2017 1048   CHOLHDL 4.2 04/25/2012 0520   VLDL 23 04/25/2012 0520   LDLCALC 64 11/03/2018 1205   LDLCALC 40 12/22/2012 1042   Hepatic Function Panel     Component Value Date/Time   PROT 7.6 11/03/2018 1205   ALBUMIN 4.2 11/03/2018 1205   AST 20 11/03/2018 1205   ALT 16 11/03/2018 1205   ALKPHOS 88 11/03/2018 1205   BILITOT 0.5 11/03/2018 1205   BILIDIR 0.19 08/05/2017 1048   IBILI 0.5 04/23/2012 1924      Component Value Date/Time   TSH 1.786 03/12/2015 0217   TSH 1.071 05/27/2014 1058   TSH 1.070 04/23/2012 1924   TSH 1.944 03/24/2011 0528      OBESITY BEHAVIORAL INTERVENTION VISIT  Today's visit was # 2   Starting weight: 235 lbs Starting date: 11/03/2018 Today's weight : 245 lbs Today's date: 11/17/2018 Total lbs lost to date: 0 At least 15 minutes were spent on discussing the following behavioral intervention visit.   ASK: We discussed the diagnosis of obesity with [name] today and [name] agreed to give [name] permission to discuss obesity behavioral modification therapy today.  ASSESS: [name] has the diagnosis of obesity and her BMI today is 43.41 [name] is in the action stage of change   ADVISE: [name] was educated on the multiple health risks of obesity as well as the benefit of weight loss to improve her health. She was advised of the need for long term treatment and the importance of lifestyle modifications to improve her current health and to decrease her risk of future health problems.  AGREE: Multiple dietary modification options and treatment options were discussed and  Jobeth agreed to follow the recommendations documented in the above note.  ARRANGE: Kryslyn was educated on the importance of frequent visits to treat obesity as outlined per CMS and USPSTF guidelines and agreed to schedule her next follow up appointment today.  Wilhemena Durie, am acting as transcriptionist for [employer], DO  I have  reviewed the above documentation for accuracy and completeness, and I agree with the above. -Jearld Lesch, DO

## 2018-11-18 LAB — BRAIN NATRIURETIC PEPTIDE: BNP: 30.6 pg/mL (ref 0.0–100.0)

## 2018-11-24 ENCOUNTER — Ambulatory Visit (INDEPENDENT_AMBULATORY_CARE_PROVIDER_SITE_OTHER): Payer: Medicare HMO

## 2018-11-24 DIAGNOSIS — I5042 Chronic combined systolic (congestive) and diastolic (congestive) heart failure: Secondary | ICD-10-CM | POA: Diagnosis not present

## 2018-11-24 DIAGNOSIS — Z95810 Presence of automatic (implantable) cardiac defibrillator: Secondary | ICD-10-CM

## 2018-11-26 NOTE — Progress Notes (Signed)
EPIC Encounter for ICM Monitoring  Patient Name: [name] is a 68 y.o. female Date: 11/26/2018 Primary Care Physican: Everardo Beals, NP Primary Care Physican: Everardo Beals, NP Primary Cardiologist:Skains/Weaver PA Electrophysiologist: Allred 10/20/2018 Weight: 246 lbs    Transmission reviewed  CorVue thoracic impedancenormal.   TakingFurosemide 80 mg 1 tablet twice a day. Potassium 20 mEq 1 tablet daily.   Labs:  01/27/2018 Creatinine 1.22, BUN 22, Potassium 5.3, Sodium 135, GFR 46-53  Recommendations: None.  Follow-up plan: ICM clinic phone appointment on 01/05/2019.   91 day device clinic remote transmission 01/16/2019.    Copy of ICM check sent to Dr. Carolann Littler.   3 month ICM trend: 11/24/2018    1 Year ICM trend:       [name], RN 11/26/2018 4:13 PM

## 2018-12-09 ENCOUNTER — Ambulatory Visit (INDEPENDENT_AMBULATORY_CARE_PROVIDER_SITE_OTHER): Payer: Medicare HMO | Admitting: Bariatrics

## 2018-12-09 ENCOUNTER — Other Ambulatory Visit: Payer: Self-pay

## 2018-12-09 ENCOUNTER — Encounter (INDEPENDENT_AMBULATORY_CARE_PROVIDER_SITE_OTHER): Payer: Self-pay | Admitting: Bariatrics

## 2018-12-09 VITALS — BP 101/53 | HR 67 | Temp 98.2°F | Ht 63.0 in | Wt 234.0 lb

## 2018-12-09 DIAGNOSIS — Z6841 Body Mass Index (BMI) 40.0 and over, adult: Secondary | ICD-10-CM

## 2018-12-09 DIAGNOSIS — N1832 Chronic kidney disease, stage 3b: Secondary | ICD-10-CM | POA: Diagnosis not present

## 2018-12-09 DIAGNOSIS — Z794 Long term (current) use of insulin: Secondary | ICD-10-CM

## 2018-12-09 DIAGNOSIS — E1121 Type 2 diabetes mellitus with diabetic nephropathy: Secondary | ICD-10-CM | POA: Diagnosis not present

## 2018-12-09 DIAGNOSIS — E7849 Other hyperlipidemia: Secondary | ICD-10-CM

## 2018-12-09 NOTE — Progress Notes (Signed)
Office: [phone]  /  Fax: [phone]   HPI:   Chief Complaint: OBESITY [name] is here to discuss her progress with her obesity treatment plan. She is on the Category 2 plan and is following her eating plan approximately 100% of the time. She states she is exercising 0 minutes 0 times per week. [name] is down 11 lbs. She is going by the plan and reports getting in more water.  Her weight is 234 lb (106.1 kg) today and has had a weight loss of 11 pounds over a period of 3 weeks since her last visit. She has lost 1 lb since starting treatment with [name].  Diabetes II with Stage 3-B CKD [name] has a diagnosis of diabetes type II and is taking Glipizide, Levemir, and NovoLog. [name] states fasting blood sugars are in the 180's with an occasional low blood sugar at 51. She reports 2-hour postprandials are in the 200's. Last A1c was 8.2 on 11/03/2018. She has been working on intensive lifestyle modifications including diet, exercise, and weight loss to help control her blood glucose levels.  Hyperlipidemia [name] has hyperlipidemia and has been trying to improve her cholesterol levels with intensive lifestyle modification including a low saturated fat diet, exercise and weight loss. She is taking Crestor and denies any chest pain, claudication or myalgias.  Chronic Kidney Disease, Stage 3-B [name] has a diagnosis of CKD, stage 3-B. She has been told to stop Ibuprofen.  ASSESSMENT AND PLAN:  Type 2 diabetes mellitus with stage 3b chronic kidney disease, with long-term current use of insulin (HCC)  Other hyperlipidemia  Stage 3b chronic kidney disease  Class 3 severe obesity with serious comorbidity and body mass index (BMI) of 40.0 to 44.9 in adult, unspecified obesity type ([place])  PLAN:  Diabetes II with Stage 3-B CKD [name] has been given diabetes education by myself including ideal fasting and post-prandial blood glucose readings, individual ideal HgA1c goals  and hypoglycemia prevention.  We discussed the importance of good blood sugar control to decrease the likelihood of diabetic complications such as nephropathy, neuropathy, limb loss, blindness, coronary artery disease, and death. We discussed the importance of intensive lifestyle modification including diet, exercise and weight loss as the first line treatment for diabetes. [name] will go by the sliding scale. She will continue checking fasting blood sugars and 2-hour postprandials.  Hyperlipidemia [name] was informed of the American Heart Association Guidelines emphasizing intensive lifestyle modifications as the first line treatment for hyperlipidemia. We discussed many lifestyle modifications today in depth, and [name] will continue to work on decreasing saturated fats such as fatty red meat, butter and many fried foods. [name] will continue her medications. She will also increase vegetables and lean protein in her diet and continue to work on exercise and weight loss efforts.  Chronic Kidney Disease, Stage 3-B [name] will not take Ibuprofen. She reports getting adequate water intake.  Obesity [name] is currently in the action stage of change. As such, her goal is to continue with weight loss efforts. She has agreed to follow the Category 2 plan. [name] will work on meal planning, increasing her water intake, and continue decreasing her carbohydrates. [name] has been instructed to work up to a goal of 150 minutes of combined cardio and strengthening exercise per week for weight loss and overall health benefits. We discussed the following Behavioral Modification Strategies today: increasing lean protein intake, decreasing simple carbohydrates, increasing vegetables, increase H20 intake, decrease liquid calories, decrease eating out, no skipping meals, work on meal  planning and easy cooking plans, keeping healthy foods in the home, better snacking choices, and emotional eating strategies.  [name] has agreed to follow-up with our clinic  in 2-3 weeks. She was informed of the importance of frequent follow-up visits to maximize her success with intensive lifestyle modifications for her multiple health conditions.  ALLERGIES: Allergies  Allergen Reactions  . Lipitor  [Atorvastatin Calcium] Rash  . Metformin And Related Itching, Swelling and Other (See Comments)    Leg pain & swelling in legs  . Atorvastatin Itching and Rash  . Levofloxacin Itching    MEDICATIONS: Current Outpatient Medications on File Prior to Visit  Medication Sig Dispense Refill  . albuterol (PROVENTIL HFA;VENTOLIN HFA) 108 (90 BASE) MCG/ACT inhaler Inhale 2 puffs into the lungs every 6 (six) hours as needed for wheezing or shortness of breath.    Marland Kitchen albuterol (PROVENTIL) (2.5 MG/3ML) 0.083% nebulizer solution Take 2.5 mg by nebulization every 6 (six) hours as needed for wheezing or shortness of breath.     Marland Kitchen amitriptyline (ELAVIL) 10 MG tablet Take 10 mg by mouth daily.    . Capsaicin (MUSCLE RELIEF EX) Apply 1 application topically 3 (three) times daily as needed. For pain    . carvedilol (COREG) 12.5 MG tablet Take 12.5 mg by mouth 2 (two) times daily with a meal.     . ferrous sulfate 300 (60 Fe) MG/5ML syrup Take 300 mg by mouth daily with lunch.    . furosemide (LASIX) 80 MG tablet TAKE 1 TABLET TWICE DAILY 180 tablet 1  . glipiZIDE (GLUCOTROL) 5 MG tablet Take 5 mg by mouth daily before breakfast.     . ibuprofen (ADVIL,MOTRIN) 200 MG tablet Take 200 mg by mouth every 8 (eight) hours as needed (for pain).    . isosorbide mononitrate (IMDUR) 30 MG 24 hr tablet TAKE 1 TABLET (30 MG TOTAL) BY MOUTH DAILY. 90 tablet 2  . ketoconazole (NIZORAL) 2 % cream Apply 1 application topically 2 (two) times daily as needed (for skin irritation).     Marland Kitchen latanoprost (XALATAN) 0.005 % ophthalmic solution Place 1 drop into both eyes at bedtime.    Marland Kitchen LEVEMIR FLEXTOUCH 100 UNIT/ML Pen Inject 15-20 Units into the skin at bedtime. Patient states she takes every other day in  the pm.  Patient takes 15-20 units if glucose is greater than 140 per patient    . lisinopril (PRINIVIL,ZESTRIL) 10 MG tablet Take 10 mg by mouth daily.     . methocarbamol (ROBAXIN) 500 MG tablet Take 1 tablet (500 mg total) by mouth every 8 (eight) hours as needed for muscle spasms. 45 tablet 0  . nitroGLYCERIN (NITROSTAT) 0.4 MG SL tablet Place 0.4 mg under the tongue every 5 (five) minutes as needed for chest pain.    Marland Kitchen NOVOLOG FLEXPEN 100 UNIT/ML FlexPen Inject 30-50 Units into the skin See admin instructions. 50 units every morning, 25 units at lunch and 50 units at supper *Hold for low blood sugar*    . oxybutynin (DITROPAN-XL) 10 MG 24 hr tablet Take 10 mg by mouth daily.    . Potassium Chloride ER 20 MEQ TBCR Take 20 mEq by mouth daily.    . rosuvastatin (CRESTOR) 20 MG tablet Take 1 tablet (20 mg total) by mouth daily at 6 PM. 30 tablet 2  . spironolactone (ALDACTONE) 25 MG tablet Take 25 mg by mouth daily.    Marland Kitchen tolterodine (DETROL LA) 4 MG 24 hr capsule Take 4 mg by mouth daily.    Marland Kitchen  traMADol (ULTRAM) 50 MG tablet Take 50 mg by mouth 2 (two) times daily.    . Vitamin D, Ergocalciferol, (DRISDOL) 1.25 MG (50000 UT) CAPS capsule Take 1 capsule (50,000 Units total) by mouth every 7 (seven) days. 4 capsule 0  . XARELTO 20 MG TABS tablet Take 20 mg by mouth daily.      No current facility-administered medications on file prior to visit.     PAST MEDICAL HISTORY: Past Medical History:  Diagnosis Date  . AICD (automatic cardioverter/defibrillator) present   . Angina decubitus ([place]) 11/06/2012  . Angina pectoris ([place])   . Anxiety   . Arthritis   . Back pain   . Bundle branch block 05/2016  . CHF (congestive heart failure) ([place])   . Chronic combined systolic and diastolic CHF, NYHA class 3 ([place])   . Coronary artery disease    s/p CABG 2007  . Depression   . Dermal mycosis   . Diabetes mellitus    type II  . DVT (deep venous thrombosis) ([place]) 09/2015   bilateral  . Edema, lower  extremity   . Glaucoma   . Hyperlipidemia   . Hypertension   . Insomnia   . Ischemic cardiomyopathy    EF 30-35%, s/p ICD 4/08 by Dr Leonia Reeves  . Joint pain   . Morbid obesity ([place]) 11/06/2012  . Obesity   . Oxygen dependent 2 L  . Pain in left knee   . Peripheral neuropathy   . 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 Sleep apnea    uses O2 at night  . SOB (shortness of breath)   . Tinea   . Ventricular tachycardia ([place]) 01/16/15   sustained VT terminated with ATP, CL 340 msec    PAST SURGICAL HISTORY: Past Surgical History:  Procedure Laterality Date  . BI-VENTRICULAR IMPLANTABLE CARDIOVERTER DEFIBRILLATOR UPGRADE N/A 09/25/2012   Procedure: BI-VENTRICULAR IMPLANTABLE CARDIOVERTER DEFIBRILLATOR UPGRADE;  Surgeon: Coralyn Mark, MD;  Location: [facility] CATH LAB;  Service: Cardiovascular;  Laterality: N/A;  . BREAST BIOPSY Right 04/05/2014  . CARDIAC CATHETERIZATION    . CARDIAC DEFIBRILLATOR PLACEMENT  4/08   by Dr Leonia Reeves (MDT)  . COLONOSCOPY WITH PROPOFOL N/A 10/12/2016   Procedure: COLONOSCOPY WITH PROPOFOL;  Surgeon: Doran Stabler, MD;  Location: [facility];  Service: Gastroenterology;  Laterality: N/A;  . CORONARY ARTERY BYPASS GRAFT  10/05/2005   by Dr Cyndia Bent  . IMPLANTABLE CARDIOVERTER DEFIBRILLATOR IMPLANT  09/25/12   attempt of upgrade to CRT-D unsuccessful due to CS anatomy, SJM Unify Asaura device placed with LV port capped by Dr Rayann Heman  . IR GENERIC HISTORICAL  10/03/2015   IR US GUIDE VASC ACCESS LEFT 10/03/2015 Jacqulynn Cadet, MD [facility]  . IR GENERIC HISTORICAL  10/03/2015   IR VENO/EXT/UNI LEFT 10/03/2015 Jacqulynn Cadet, MD [facility]  . IR GENERIC HISTORICAL  10/03/2015   IR VENOCAVAGRAM IVC 10/03/2015 Jacqulynn Cadet, MD [facility]  . IR GENERIC HISTORICAL  10/03/2015   IR INFUSION THROMBOL VENOUS INITIAL (MS) 10/03/2015 Jacqulynn Cadet, MD [facility]  . IR  GENERIC HISTORICAL  10/03/2015   IR US GUIDE VASC ACCESS LEFT 10/03/2015 Jacqulynn Cadet, MD [facility]  . IR GENERIC HISTORICAL  10/04/2015   IR THROMBECT VENO MECH MOD SED 10/04/2015 Sandi Mariscal, MD [facility]  . IR GENERIC HISTORICAL  10/04/2015   IR TRANSCATH PLC  STENT 1ST ART NOT LE CV CAR VERT CAR 10/04/2015 Sandi Mariscal, MD [facility]  . IR GENERIC HISTORICAL  10/04/2015   IR THROMB F/U EVAL ART/VEN FINAL DAY (MS) 10/04/2015 Sandi Mariscal, MD [facility]  . IR GENERIC HISTORICAL  11/02/2015   IR RADIOLOGIST EVAL & MGMT 11/02/2015 Sandi Mariscal, MD [facility]  . IR RADIOLOGIST EVAL & MGMT  04/26/2016    SOCIAL HISTORY: Social History   Tobacco Use  . Smoking status: Never Smoker  . Smokeless tobacco: Never Used  Substance Use Topics  . Alcohol use: No  . Drug use: No    FAMILY HISTORY: Family History  Problem Relation Age of Onset  . Diabetes Mother 52       died - HTN  . Stroke Mother   . High blood pressure Mother   . Sudden death Mother   . Obesity Mother   . Other Other        No early family hx of CAD   ROS: Review of Systems  Cardiovascular: Negative for chest pain and claudication.  Musculoskeletal: Negative for myalgias.  Endo/Heme/Allergies:       Positive for occasional hypoglycemia.   PHYSICAL EXAM: Blood pressure (!) 101/53, pulse 67, temperature 98.2 F (36.8 C), height 5\' 3"  (1.6 m), weight 234 lb (106.1 kg), SpO2 99 %. Body mass index is 41.45 kg/m. Physical Exam Vitals signs reviewed.  Constitutional:      Appearance: Normal appearance. She is obese.  Cardiovascular:     Rate and Rhythm: Normal rate.     Pulses: Normal pulses.  Pulmonary:     Effort: Pulmonary effort is normal.     Breath sounds: Normal breath sounds.  Musculoskeletal: Normal range of motion.  Skin:    General: Skin is warm and dry.  Neurological:     Mental Status: She is alert and oriented to person, place, and time.     Gait: Gait abnormal (antalgic gait).      Comments: Uses a walker for ambulation  Psychiatric:        Behavior: Behavior normal.   RECENT LABS AND TESTS: BMET    Component Value Date/Time   NA 135 11/03/2018 1205   K 4.7 11/03/2018 1205   CL 97 11/03/2018 1205   CO2 23 11/03/2018 1205   GLUCOSE 44 (L) 11/03/2018 1205   GLUCOSE 178 (H) 10/07/2015 0134   BUN 32 (H) 11/03/2018 1205   CREATININE 1.79 (H) 11/03/2018 1205   CREATININE 0.88 09/20/2015 1153   CALCIUM 9.7 11/03/2018 1205   GFRNONAA 29 (L) 11/03/2018 1205   GFRAA 33 (L) 11/03/2018 1205   Lab Results  Component Value Date   HGBA1C 8.2 (H) 11/03/2018   HGBA1C 7.8 (H) 10/02/2015   HGBA1C 8.3 (H) 10/01/2015   HGBA1C 8.7 (H) 03/12/2015   HGBA1C 8.6 (H) 05/26/2014   No results found for: INSULIN CBC    Component Value Date/Time   WBC 4.4 01/31/2018 1029   WBC 5.6 10/07/2015 0715   RBC 3.27 (L) 01/31/2018 1029   RBC 3.71 (L) 10/07/2015 0715   HGB 11.8 01/31/2018 1029   HCT 34.0 01/31/2018 1029   PLT 173 01/31/2018 1029   MCV 104 (H) 01/31/2018 1029   MCH 36.1 (H) 01/31/2018 1029   MCH 29.1 10/07/2015 0715   MCHC 34.7 01/31/2018 1029   MCHC 31.5 10/07/2015 0715   RDW 16.2 (H) 01/31/2018 1029   LYMPHSABS 1.9 05/26/2014 0723   MONOABS 0.7 05/26/2014 [id number]  EOSABS 0.2 05/26/2014 0723   BASOSABS 0.0 05/26/2014 0723   Iron/TIBC/Ferritin/ %Sat No results found for: IRON, TIBC, FERRITIN, IRONPCTSAT Lipid Panel     Component Value Date/Time   CHOL 111 11/03/2018 1205   CHOL 90 12/22/2012 1042   TRIG 87 11/03/2018 1205   TRIG 85 12/22/2012 1042   HDL 30 (L) 11/03/2018 1205   HDL 33 (L) 12/22/2012 1042   CHOLHDL 3.6 08/05/2017 1048   CHOLHDL 4.2 04/25/2012 0520   VLDL 23 04/25/2012 0520   LDLCALC 64 11/03/2018 1205   LDLCALC 40 12/22/2012 1042   Hepatic Function Panel     Component Value Date/Time   PROT 7.6 11/03/2018 1205   ALBUMIN 4.2 11/03/2018 1205   AST 20 11/03/2018 1205   ALT 16 11/03/2018 1205   ALKPHOS 88 11/03/2018 1205   BILITOT  0.5 11/03/2018 1205   BILIDIR 0.19 08/05/2017 1048   IBILI 0.5 04/23/2012 1924      Component Value Date/Time   TSH 1.786 03/12/2015 0217   TSH 1.071 05/27/2014 1058   TSH 1.070 04/23/2012 1924   TSH 1.944 03/24/2011 0528   Results for SHELI, DINGESS (MRN [id number]) as of 12/09/2018 14:11  Ref. Range 11/03/2018 12:05  Vitamin D, 25-Hydroxy Latest Ref Range: 30.0 - 100.0 ng/mL 18.3 (L)   OBESITY BEHAVIORAL INTERVENTION VISIT  Today's visit was #3  Starting weight: 235 lbs Starting date: 11/03/2018 Today's weight: 234 lbs  Today's date: 12/09/2018 Total lbs lost to date: 1  At least 15 minutes were spent on discussing the following behavioral intervention visit.    12/09/2018  Height 5\' 3"  (1.6 m)  Weight 234 lb (106.1 kg)  BMI (Calculated) 41.46  BLOOD PRESSURE - SYSTOLIC [id number]  BLOOD PRESSURE - DIASTOLIC 53   Body Fat % 99991111 %   ASK: We discussed the diagnosis of obesity with Signa L Klonowski today and Vivica agreed to give [name] permission to discuss obesity behavioral modification therapy today.  ASSESS: Ashwika has the diagnosis of obesity and her BMI today is 41.5. Al is in the action stage of change.   ADVISE: Labrenda was educated on the multiple health risks of obesity as well as the benefit of weight loss to improve her health. She was advised of the need for long term treatment and the importance of lifestyle modifications to improve her current health and to decrease her risk of future health problems.  AGREE: Multiple dietary modification options and treatment options were discussed and  Saanvika agreed to follow the recommendations documented in the above note.  ARRANGE: Marnette was educated on the importance of frequent visits to treat obesity as outlined per CMS and USPSTF guidelines and agreed to schedule her next follow up appointment today.  Migdalia Dk, am acting as Location manager for [employer], DO   I have reviewed the above documentation for accuracy and  completeness, and I agree with the above. -Jearld Lesch, DO

## 2018-12-10 ENCOUNTER — Encounter (INDEPENDENT_AMBULATORY_CARE_PROVIDER_SITE_OTHER): Payer: Self-pay | Admitting: Bariatrics

## 2018-12-30 ENCOUNTER — Other Ambulatory Visit: Payer: Self-pay

## 2018-12-30 ENCOUNTER — Ambulatory Visit (INDEPENDENT_AMBULATORY_CARE_PROVIDER_SITE_OTHER): Payer: Medicare HMO | Admitting: Bariatrics

## 2018-12-30 ENCOUNTER — Encounter (INDEPENDENT_AMBULATORY_CARE_PROVIDER_SITE_OTHER): Payer: Self-pay | Admitting: Bariatrics

## 2018-12-30 VITALS — BP 121/78 | HR 76 | Temp 98.5°F | Ht 63.0 in | Wt 229.0 lb

## 2018-12-30 DIAGNOSIS — Z6841 Body Mass Index (BMI) 40.0 and over, adult: Secondary | ICD-10-CM | POA: Diagnosis not present

## 2018-12-30 DIAGNOSIS — E559 Vitamin D deficiency, unspecified: Secondary | ICD-10-CM

## 2018-12-30 DIAGNOSIS — E119 Type 2 diabetes mellitus without complications: Secondary | ICD-10-CM

## 2018-12-30 MED ORDER — VITAMIN D (ERGOCALCIFEROL) 1.25 MG (50000 UNIT) PO CAPS: 50000.0000 [IU] | ORAL_CAPSULE | ORAL | 0 refills | Status: AC

## 2018-12-30 NOTE — Progress Notes (Signed)
Office: [phone]  /  Fax: [phone]   HPI:  Chief Complaint: OBESITY [name] is here to discuss her progress with her obesity treatment plan. She is on the Category 2 plan and states she is following her eating plan approximately 100% of the time. She states she is exercising 0 minutes 0 times per week.  [name] is down 5 lbs. She has been following the plan and drinking adequate water.  Today's visit was #4 Starting weight: 235 lbs Starting date: 11/03/2018 Today's weight: 229 lbs Today's date: 12/30/2018 Total lbs lost to date: 6 Total lbs lost since last in-office visit: 5  Type II Diabetes Mellitus [name] has a diagnosis of type II diabetes mellitus and is taking glipizide, Levemir, NovoLog, and Ozempic with 1 low of 75. She states fasting blood sugars range between 130 and 160's with 2-hour postprandials around 200. Last A1c 8.2 on 11/03/2018.  Vitamin D deficiency [name] has a diagnosis of Vitamin D deficiency. Last Vitamin D 18.3 on 11/03/2018. No nausea, vomiting, or muscle weakness.  ASSESSMENT AND PLAN:  Type 2 diabetes mellitus without complication, without long-term current use of insulin (HCC)  Vitamin D deficiency - Plan: Vitamin D, Ergocalciferol, (DRISDOL) 1.25 MG (50000 UT) CAPS capsule  Class 3 severe obesity with serious comorbidity and body mass index (BMI) of 40.0 to 44.9 in adult, unspecified obesity type (HCC)  PLAN:  Diabetes II [name] has been given diabetes education by myself today. Good blood sugar control is important to decrease the likelihood of diabetic complications such as nephropathy, neuropathy, limb loss, blindness, coronary artery disease, and death. Intensive lifestyle modification including diet, exercise and weight loss were discussed as the first line treatment for diabetes. [name] will get some glucose tablets. She will slowly decrease NovoLog and glipizide if low blood sugar.  Vitamin D Deficiency [name] was informed that low Vitamin  D levels contributes to fatigue and are associated with obesity, breast, and colon cancer. She will start prescription Vit D @ 50,000 IU every week #5 with 0 refills and will follow-up for routine testing of Vitamin D, at least 2-3 times per year. She was informed of the risk of over-replacement of Vitamin D and agrees to not increase her dose unless she discusses this with [name] first. [name] agrees to follow-up with our clinic in 2-3 weeks.  Obesity [name] is currently in the action stage of change. As such, her goal is to continue with weight loss efforts. She has agreed to follow the Category 2 plan. [name] will work on meal planning and intentional eating. [name] has been instructed to work up to a goal of 150 minutes of combined cardio and strengthening exercise per week for weight loss and overall health benefits. We discussed the following Behavioral Modification Strategies today: increasing lean protein intake, decreasing simple carbohydrates, increasing vegetables, increase H20 intake, decrease eating out, no skipping meals, work on meal planning and easy cooking plans, and keeping healthy foods in the home.  [name] has agreed to follow-up with our clinic in 2-3 weeks. She was informed of the importance of frequent follow-up visits to maximize her success with intensive lifestyle modifications for her multiple health conditions.  ALLERGIES: Allergies  Allergen Reactions  . Lipitor  [Atorvastatin Calcium] Rash  . Metformin And Related Itching, Swelling and Other (See Comments)    Leg pain & swelling in legs  . Atorvastatin Itching and Rash  . Levofloxacin Itching    MEDICATIONS: Current Outpatient Medications on File Prior to Visit  Medication  Sig Dispense Refill  . albuterol (PROVENTIL HFA;VENTOLIN HFA) 108 (90 BASE) MCG/ACT inhaler Inhale 2 puffs into the lungs every 6 (six) hours as needed for wheezing or shortness of breath.    Marland Kitchen albuterol (PROVENTIL) (2.5 MG/3ML) 0.083% nebulizer  solution Take 2.5 mg by nebulization every 6 (six) hours as needed for wheezing or shortness of breath.     Marland Kitchen amitriptyline (ELAVIL) 10 MG tablet Take 10 mg by mouth daily.    . Capsaicin (MUSCLE RELIEF EX) Apply 1 application topically 3 (three) times daily as needed. For pain    . carvedilol (COREG) 12.5 MG tablet Take 12.5 mg by mouth 2 (two) times daily with a meal.     . ferrous sulfate 300 (60 Fe) MG/5ML syrup Take 300 mg by mouth daily with lunch.    . furosemide (LASIX) 80 MG tablet TAKE 1 TABLET TWICE DAILY 180 tablet 1  . glipiZIDE (GLUCOTROL) 5 MG tablet Take 5 mg by mouth daily before breakfast.     . ibuprofen (ADVIL,MOTRIN) 200 MG tablet Take 200 mg by mouth every 8 (eight) hours as needed (for pain).    . isosorbide mononitrate (IMDUR) 30 MG 24 hr tablet TAKE 1 TABLET (30 MG TOTAL) BY MOUTH DAILY. 90 tablet 2  . ketoconazole (NIZORAL) 2 % cream Apply 1 application topically 2 (two) times daily as needed (for skin irritation).     Marland Kitchen latanoprost (XALATAN) 0.005 % ophthalmic solution Place 1 drop into both eyes at bedtime.    Marland Kitchen LEVEMIR FLEXTOUCH 100 UNIT/ML Pen Inject 15-20 Units into the skin at bedtime. Patient states she takes every other day in the pm.  Patient takes 15-20 units if glucose is greater than 140 per patient    . lisinopril (PRINIVIL,ZESTRIL) 10 MG tablet Take 10 mg by mouth daily.     . methocarbamol (ROBAXIN) 500 MG tablet Take 1 tablet (500 mg total) by mouth every 8 (eight) hours as needed for muscle spasms. 45 tablet 0  . nitroGLYCERIN (NITROSTAT) 0.4 MG SL tablet Place 0.4 mg under the tongue every 5 (five) minutes as needed for chest pain.    Marland Kitchen NOVOLOG FLEXPEN 100 UNIT/ML FlexPen Inject 30-50 Units into the skin See admin instructions. 50 units every morning, 25 units at lunch and 50 units at supper *Hold for low blood sugar*    . oxybutynin (DITROPAN-XL) 10 MG 24 hr tablet Take 10 mg by mouth daily.    . Potassium Chloride ER 20 MEQ TBCR Take 20 mEq by mouth  daily.    . rosuvastatin (CRESTOR) 20 MG tablet Take 1 tablet (20 mg total) by mouth daily at 6 PM. 30 tablet 2  . Semaglutide (OZEMPIC, 0.25 OR 0.5 MG/DOSE, [place]) Inject into the skin.    Marland Kitchen spironolactone (ALDACTONE) 25 MG tablet Take 25 mg by mouth daily.    Marland Kitchen tolterodine (DETROL LA) 4 MG 24 hr capsule Take 4 mg by mouth daily.    . traMADol (ULTRAM) 50 MG tablet Take 50 mg by mouth 2 (two) times daily.    Alveda Reasons 20 MG TABS tablet Take 20 mg by mouth daily.      No current facility-administered medications on file prior to visit.    PAST MEDICAL HISTORY: Past Medical History:  Diagnosis Date  . AICD (automatic cardioverter/defibrillator) present   . Angina decubitus ([place]) 11/06/2012  . Angina pectoris ([place])   . Anxiety   . Arthritis   . Back pain   . Bundle branch block  05/2016  . CHF (congestive heart failure) ([place])   . Chronic combined systolic and diastolic CHF, NYHA class 3 ([place])   . Coronary artery disease    s/p CABG 2007  . Depression   . Dermal mycosis   . Diabetes mellitus    type II  . DVT (deep venous thrombosis) ([place]) 09/2015   bilateral  . Edema, lower extremity   . Glaucoma   . Hyperlipidemia   . Hypertension   . Insomnia   . Ischemic cardiomyopathy    EF 30-35%, s/p ICD 4/08 by Dr Leonia Reeves  . Joint pain   . Morbid obesity ([place]) 11/06/2012  . Obesity   . Oxygen dependent 2 L  . Pain in left knee   . Peripheral neuropathy   . 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 Sleep apnea    uses O2 at night  . SOB (shortness of breath)   . Tinea   . Ventricular tachycardia ([place]) 01/16/15   sustained VT terminated with ATP, CL 340 msec    PAST SURGICAL HISTORY: Past Surgical History:  Procedure Laterality Date  . BI-VENTRICULAR IMPLANTABLE CARDIOVERTER DEFIBRILLATOR UPGRADE N/A 09/25/2012   Procedure: BI-VENTRICULAR IMPLANTABLE CARDIOVERTER DEFIBRILLATOR UPGRADE;   Surgeon: Coralyn Mark, MD;  Location: [facility] CATH LAB;  Service: Cardiovascular;  Laterality: N/A;  . BREAST BIOPSY Right 04/05/2014  . CARDIAC CATHETERIZATION    . CARDIAC DEFIBRILLATOR PLACEMENT  4/08   by Dr Leonia Reeves ([facility]T)  . COLONOSCOPY WITH PROPOFOL N/A 10/12/2016   Procedure: COLONOSCOPY WITH PROPOFOL;  Surgeon: Doran Stabler, MD;  Location: [facility] ENDOSCOPY;  Service: Gastroenterology;  Laterality: N/A;  . CORONARY ARTERY BYPASS GRAFT  10/05/2005   by Dr Cyndia Bent  . IMPLANTABLE CARDIOVERTER DEFIBRILLATOR IMPLANT  09/25/12   attempt of upgrade to CRT-D unsuccessful due to CS anatomy, SJM Unify [place] device placed with LV port capped by Dr Rayann Heman  . IR GENERIC HISTORICAL  10/03/2015   IR US GUIDE VASC ACCESS LEFT 10/03/2015 Jacqulynn Cadet, MD [facility]  . IR GENERIC HISTORICAL  10/03/2015   IR VENO/EXT/UNI LEFT 10/03/2015 Jacqulynn Cadet, MD [facility]  . IR GENERIC HISTORICAL  10/03/2015   IR VENOCAVAGRAM IVC 10/03/2015 Jacqulynn Cadet, MD [facility]  . IR GENERIC HISTORICAL  10/03/2015   IR INFUSION THROMBOL VENOUS INITIAL (MS) 10/03/2015 Jacqulynn Cadet, MD [facility]  . IR GENERIC HISTORICAL  10/03/2015   IR US GUIDE VASC ACCESS LEFT 10/03/2015 Jacqulynn Cadet, MD [facility]  . IR GENERIC HISTORICAL  10/04/2015   IR THROMBECT VENO MECH MOD SED 10/04/2015 Sandi Mariscal, MD [facility]  . IR GENERIC HISTORICAL  10/04/2015   IR TRANSCATH PLC STENT 1ST ART NOT LE CV CAR VERT CAR 10/04/2015 Sandi Mariscal, MD [facility]  . IR GENERIC HISTORICAL  10/04/2015   IR THROMB F/U EVAL ART/VEN FINAL DAY (MS) 10/04/2015 Sandi Mariscal, MD [facility]  . IR GENERIC HISTORICAL  11/02/2015   IR RADIOLOGIST EVAL & MGMT 11/02/2015 Sandi Mariscal, MD [facility]  . IR RADIOLOGIST EVAL & MGMT  04/26/2016    SOCIAL HISTORY: Social History   Tobacco Use  . Smoking status: Never Smoker  . Smokeless tobacco: Never Used  Substance Use Topics  . Alcohol use: No  . Drug use: No    FAMILY  HISTORY: Family History  Problem Relation Age of Onset  .  Diabetes Mother 71       died - HTN  . Stroke Mother   . High blood pressure Mother   . Sudden death Mother   . Obesity Mother   . Other Other        No early family hx of CAD   ROS: Review of Systems  Constitutional: Positive for weight loss.  Gastrointestinal: Negative for nausea and vomiting.  Musculoskeletal:       Negative for muscle weakness.   PHYSICAL EXAM: Blood pressure 121/78, pulse 76, temperature 98.5 F (36.9 C), height 5\' 3"  (1.6 m), weight 229 lb (103.9 kg), SpO2 98 %. Body mass index is 40.57 kg/m. Physical Exam Vitals reviewed.  Constitutional:      Appearance: Normal appearance. She is obese.  Cardiovascular:     Rate and Rhythm: Normal rate.     Pulses: Normal pulses.  Pulmonary:     Effort: Pulmonary effort is normal.     Breath sounds: Normal breath sounds.  Musculoskeletal:        General: Normal range of motion.  Skin:    General: Skin is warm and dry.  Neurological:     Mental Status: She is alert and oriented to person, place, and time.     Comments: Uses a walker for ambulation.  Psychiatric:        Behavior: Behavior normal.   RECENT LABS AND TESTS: BMET    Component Value Date/Time   NA 135 11/03/2018 1205   K 4.7 11/03/2018 1205   CL 97 11/03/2018 1205   CO2 23 11/03/2018 1205   GLUCOSE 44 (L) 11/03/2018 1205   GLUCOSE 178 (H) 10/07/2015 0134   BUN 32 (H) 11/03/2018 1205   CREATININE 1.79 (H) 11/03/2018 1205   CREATININE 0.88 09/20/2015 1153   CALCIUM 9.7 11/03/2018 1205   GFRNONAA 29 (L) 11/03/2018 1205   GFRAA 33 (L) 11/03/2018 1205   Lab Results  Component Value Date   HGBA1C 8.2 (H) 11/03/2018   HGBA1C 7.8 (H) 10/02/2015   HGBA1C 8.3 (H) 10/01/2015   HGBA1C 8.7 (H) 03/12/2015   HGBA1C 8.6 (H) 05/26/2014   No results found for: INSULIN CBC    Component Value Date/Time   WBC 4.4 01/31/2018 1029   WBC 5.6 10/07/2015 0715   RBC 3.27 (L) 01/31/2018 1029    RBC 3.71 (L) 10/07/2015 0715   HGB 11.8 01/31/2018 1029   HCT 34.0 01/31/2018 1029   PLT 173 01/31/2018 1029   MCV 104 (H) 01/31/2018 1029   MCH 36.1 (H) 01/31/2018 1029   MCH 29.1 10/07/2015 0715   MCHC 34.7 01/31/2018 1029   MCHC 31.5 10/07/2015 0715   RDW 16.2 (H) 01/31/2018 1029   LYMPHSABS 1.9 05/26/2014 0723   MONOABS 0.7 05/26/2014 0723   EOSABS 0.2 05/26/2014 0723   BASOSABS 0.0 05/26/2014 0723   Iron/TIBC/Ferritin/ %Sat No results found for: IRON, TIBC, FERRITIN, IRONPCTSAT Lipid Panel     Component Value Date/Time   CHOL 111 11/03/2018 1205   CHOL 90 12/22/2012 1042   TRIG 87 11/03/2018 1205   TRIG 85 12/22/2012 1042   HDL 30 (L) 11/03/2018 1205   HDL 33 (L) 12/22/2012 1042   CHOLHDL 3.6 08/05/2017 1048   CHOLHDL 4.2 04/25/2012 0520   VLDL 23 04/25/2012 0520   LDLCALC 64 11/03/2018 1205   LDLCALC 40 12/22/2012 1042   Hepatic Function Panel     Component Value Date/Time   PROT 7.6 11/03/2018 1205   ALBUMIN 4.2 11/03/2018 1205  AST 20 11/03/2018 1205   ALT 16 11/03/2018 1205   ALKPHOS 88 11/03/2018 1205   BILITOT 0.5 11/03/2018 1205   BILIDIR 0.19 08/05/2017 1048   IBILI 0.5 04/23/2012 1924      Component Value Date/Time   TSH 1.786 03/12/2015 0217   TSH 1.071 05/27/2014 1058   TSH 1.070 04/23/2012 1924   TSH 1.944 03/24/2011 0528    OBESITY BEHAVIORAL INTERVENTION VISIT DOCUMENTATION FOR INSURANCE (~15 minutes)  ASK: We discussed the diagnosis of obesity with [name] today and [name] agreed to give [name] permission to discuss obesity behavioral modification therapy today.  ASSESS: [name] has the diagnosis of obesity and her BMI today is 40.7. [name] is in the action stage of change.   ADVISE: [name] was educated on the multiple health risks of obesity as well as the benefit of weight loss to improve her health. She was advised of the need for long term treatment and the importance of lifestyle modifications to improve her current health and to  decrease her risk of future health problems.  AGREE: Multiple dietary modification options and treatment options were discussed and  [name] agreed to follow the recommendations documented in the above note.  ARRANGE: [name] was educated on the importance of frequent visits to treat obesity as outlined per CMS and USPSTF guidelines and agreed to schedule her next follow up appointment today.  Migdalia Dk, am acting as Location manager for [employer], DO  I have reviewed the above documentation for accuracy and completeness, and I agree with the above. -Jearld Lesch, DO

## 2018-12-31 ENCOUNTER — Encounter (INDEPENDENT_AMBULATORY_CARE_PROVIDER_SITE_OTHER): Payer: Self-pay | Admitting: Bariatrics

## 2018-12-31 ENCOUNTER — Other Ambulatory Visit: Payer: Self-pay | Admitting: Physician Assistant

## 2018-12-31 ENCOUNTER — Other Ambulatory Visit: Payer: Self-pay | Admitting: Cardiology

## 2019-01-05 ENCOUNTER — Ambulatory Visit (INDEPENDENT_AMBULATORY_CARE_PROVIDER_SITE_OTHER): Payer: Medicare HMO

## 2019-01-05 DIAGNOSIS — I5042 Chronic combined systolic (congestive) and diastolic (congestive) heart failure: Secondary | ICD-10-CM

## 2019-01-05 DIAGNOSIS — Z95810 Presence of automatic (implantable) cardiac defibrillator: Secondary | ICD-10-CM | POA: Diagnosis not present

## 2019-01-07 NOTE — Progress Notes (Signed)
EPIC Encounter for ICM Monitoring  Patient Name: [name] is a 68 y.o. female Date: 01/07/2019 Primary Care Physican: Everardo Beals, NP Primary Cardiologist:Skains/Weaver PA Electrophysiologist: Allred 01/07/2019 Weight: 239lbs    Spoke with patient.  She has joined a weight program and losing weight.  She denies any fluid symptoms.   CorVue thoracic impedancenormal.  TakingFurosemide 80 mg 1 tablet twice a day. Potassium 20 mEq 1 tablet daily.   Labs:  01/27/2018 Creatinine 1.22, BUN 22, Potassium 5.3, Sodium 135, GFR 46-53  Recommendations:  No changes and encouraged to call if experiencing any fluid symptoms.  Follow-up plan: ICM clinic phone appointment on 02/09/2019.   91 day device clinic remote transmission 01/16/2019.  Office visit with Dr Marlou Porch on 02/16/2019.  Copy of ICM check sent to Dr. Rayann Heman.   3 month ICM trend: 01/07/2019    1 Year ICM trend:       [name], RN 01/07/2019 9:40 AM

## 2019-01-12 ENCOUNTER — Inpatient Hospital Stay (HOSPITAL_COMMUNITY)
Admission: EM | Admit: 2019-01-12 | Discharge: 2019-01-21 | DRG: 438 | Disposition: A | Discharge status: Home Health | Payer: Medicare HMO | Attending: Internal Medicine | Admitting: Internal Medicine

## 2019-01-12 ENCOUNTER — Emergency Department (HOSPITAL_COMMUNITY): Payer: Medicare HMO

## 2019-01-12 ENCOUNTER — Other Ambulatory Visit: Payer: Self-pay

## 2019-01-12 ENCOUNTER — Encounter (HOSPITAL_COMMUNITY): Payer: Self-pay | Admitting: Emergency Medicine

## 2019-01-12 DIAGNOSIS — Z79899 Other long term (current) drug therapy: Secondary | ICD-10-CM

## 2019-01-12 DIAGNOSIS — Z951 Presence of aortocoronary bypass graft: Secondary | ICD-10-CM

## 2019-01-12 DIAGNOSIS — E1022 Type 1 diabetes mellitus with diabetic chronic kidney disease: Secondary | ICD-10-CM | POA: Diagnosis present

## 2019-01-12 DIAGNOSIS — I5042 Chronic combined systolic (congestive) and diastolic (congestive) heart failure: Secondary | ICD-10-CM | POA: Diagnosis present

## 2019-01-12 DIAGNOSIS — E1110 Type 2 diabetes mellitus with ketoacidosis without coma: Secondary | ICD-10-CM | POA: Diagnosis present

## 2019-01-12 DIAGNOSIS — Z794 Long term (current) use of insulin: Secondary | ICD-10-CM

## 2019-01-12 DIAGNOSIS — R7401 Elevation of levels of liver transaminase levels: Secondary | ICD-10-CM

## 2019-01-12 DIAGNOSIS — G4733 Obstructive sleep apnea (adult) (pediatric): Secondary | ICD-10-CM | POA: Diagnosis present

## 2019-01-12 DIAGNOSIS — Z7901 Long term (current) use of anticoagulants: Secondary | ICD-10-CM | POA: Diagnosis not present

## 2019-01-12 DIAGNOSIS — N179 Acute kidney failure, unspecified: Secondary | ICD-10-CM | POA: Diagnosis present

## 2019-01-12 DIAGNOSIS — I248 Other forms of acute ischemic heart disease: Secondary | ICD-10-CM | POA: Diagnosis present

## 2019-01-12 DIAGNOSIS — E785 Hyperlipidemia, unspecified: Secondary | ICD-10-CM | POA: Diagnosis present

## 2019-01-12 DIAGNOSIS — Z9981 Dependence on supplemental oxygen: Secondary | ICD-10-CM

## 2019-01-12 DIAGNOSIS — K859 Acute pancreatitis without necrosis or infection, unspecified: Secondary | ICD-10-CM | POA: Diagnosis present

## 2019-01-12 DIAGNOSIS — F419 Anxiety disorder, unspecified: Secondary | ICD-10-CM | POA: Diagnosis present

## 2019-01-12 DIAGNOSIS — Z20822 Contact with and (suspected) exposure to covid-19: Secondary | ICD-10-CM | POA: Diagnosis present

## 2019-01-12 DIAGNOSIS — Z8249 Family history of ischemic heart disease and other diseases of the circulatory system: Secondary | ICD-10-CM

## 2019-01-12 DIAGNOSIS — E1042 Type 1 diabetes mellitus with diabetic polyneuropathy: Secondary | ICD-10-CM | POA: Diagnosis present

## 2019-01-12 DIAGNOSIS — B964 Proteus (mirabilis) (morganii) as the cause of diseases classified elsewhere: Secondary | ICD-10-CM | POA: Diagnosis present

## 2019-01-12 DIAGNOSIS — H409 Unspecified glaucoma: Secondary | ICD-10-CM | POA: Diagnosis present

## 2019-01-12 DIAGNOSIS — K851 Biliary acute pancreatitis without necrosis or infection: Secondary | ICD-10-CM | POA: Diagnosis present

## 2019-01-12 DIAGNOSIS — G8929 Other chronic pain: Secondary | ICD-10-CM | POA: Diagnosis present

## 2019-01-12 DIAGNOSIS — K8010 Calculus of gallbladder with chronic cholecystitis without obstruction: Secondary | ICD-10-CM | POA: Diagnosis present

## 2019-01-12 DIAGNOSIS — Z95810 Presence of automatic (implantable) cardiac defibrillator: Secondary | ICD-10-CM | POA: Diagnosis not present

## 2019-01-12 DIAGNOSIS — Z86718 Personal history of other venous thrombosis and embolism: Secondary | ICD-10-CM

## 2019-01-12 DIAGNOSIS — F329 Major depressive disorder, single episode, unspecified: Secondary | ICD-10-CM | POA: Diagnosis present

## 2019-01-12 DIAGNOSIS — R945 Abnormal results of liver function studies: Secondary | ICD-10-CM | POA: Diagnosis not present

## 2019-01-12 DIAGNOSIS — I82402 Acute embolism and thrombosis of unspecified deep veins of left lower extremity: Secondary | ICD-10-CM | POA: Diagnosis not present

## 2019-01-12 DIAGNOSIS — I361 Nonrheumatic tricuspid (valve) insufficiency: Secondary | ICD-10-CM | POA: Diagnosis not present

## 2019-01-12 DIAGNOSIS — I472 Ventricular tachycardia: Secondary | ICD-10-CM | POA: Diagnosis not present

## 2019-01-12 DIAGNOSIS — R7989 Other specified abnormal findings of blood chemistry: Secondary | ICD-10-CM | POA: Diagnosis not present

## 2019-01-12 DIAGNOSIS — N390 Urinary tract infection, site not specified: Secondary | ICD-10-CM | POA: Diagnosis present

## 2019-01-12 DIAGNOSIS — I130 Hypertensive heart and chronic kidney disease with heart failure and stage 1 through stage 4 chronic kidney disease, or unspecified chronic kidney disease: Secondary | ICD-10-CM | POA: Diagnosis present

## 2019-01-12 DIAGNOSIS — I82409 Acute embolism and thrombosis of unspecified deep veins of unspecified lower extremity: Secondary | ICD-10-CM | POA: Diagnosis present

## 2019-01-12 DIAGNOSIS — E860 Dehydration: Secondary | ICD-10-CM | POA: Diagnosis present

## 2019-01-12 DIAGNOSIS — E1010 Type 1 diabetes mellitus with ketoacidosis without coma: Secondary | ICD-10-CM | POA: Diagnosis present

## 2019-01-12 DIAGNOSIS — E1310 Other specified diabetes mellitus with ketoacidosis without coma: Secondary | ICD-10-CM | POA: Diagnosis not present

## 2019-01-12 DIAGNOSIS — I255 Ischemic cardiomyopathy: Secondary | ICD-10-CM | POA: Diagnosis present

## 2019-01-12 DIAGNOSIS — Z6841 Body Mass Index (BMI) 40.0 and over, adult: Secondary | ICD-10-CM | POA: Diagnosis not present

## 2019-01-12 DIAGNOSIS — R112 Nausea with vomiting, unspecified: Secondary | ICD-10-CM | POA: Diagnosis present

## 2019-01-12 DIAGNOSIS — I2510 Atherosclerotic heart disease of native coronary artery without angina pectoris: Secondary | ICD-10-CM | POA: Diagnosis present

## 2019-01-12 DIAGNOSIS — N1831 Chronic kidney disease, stage 3a: Secondary | ICD-10-CM | POA: Diagnosis present

## 2019-01-12 DIAGNOSIS — I2582 Chronic total occlusion of coronary artery: Secondary | ICD-10-CM | POA: Diagnosis present

## 2019-01-12 DIAGNOSIS — I4891 Unspecified atrial fibrillation: Secondary | ICD-10-CM | POA: Diagnosis present

## 2019-01-12 DIAGNOSIS — I2489 Other forms of acute ischemic heart disease: Secondary | ICD-10-CM

## 2019-01-12 HISTORY — DX: Type 2 diabetes mellitus with ketoacidosis without coma: E11.10

## 2019-01-12 LAB — CBC WITH DIFFERENTIAL/PLATELET
Abs Immature Granulocytes: 0 10*3/uL (ref 0.00–0.07)
Band Neutrophils: 13 %
Basophils Absolute: 0 10*3/uL (ref 0.0–0.1)
Basophils Relative: 0 %
Eosinophils Absolute: 0 10*3/uL (ref 0.0–0.5)
Eosinophils Relative: 0 %
HCT: 45.2 % (ref 36.0–46.0)
Hemoglobin: 14.6 g/dL (ref 12.0–15.0)
Lymphocytes Relative: 5 %
Lymphs Abs: 0.7 10*3/uL (ref 0.7–4.0)
MCH: 32.7 pg (ref 26.0–34.0)
MCHC: 32.3 g/dL (ref 30.0–36.0)
MCV: 101.3 fL — ABNORMAL HIGH (ref 80.0–100.0)
Monocytes Absolute: 0.4 10*3/uL (ref 0.1–1.0)
Monocytes Relative: 3 %
Neutro Abs: 12.3 10*3/uL — ABNORMAL HIGH (ref 1.7–7.7)
Neutrophils Relative %: 79 %
Platelets: 164 10*3/uL (ref 150–400)
RBC: 4.46 MIL/uL (ref 3.87–5.11)
RDW: 13.5 % (ref 11.5–15.5)
WBC: 13.4 10*3/uL — ABNORMAL HIGH (ref 4.0–10.5)
nRBC: 0 % (ref 0.0–0.2)

## 2019-01-12 LAB — TROPONIN I (HIGH SENSITIVITY): Troponin I (High Sensitivity): 86 ng/L — ABNORMAL HIGH (ref <18)

## 2019-01-12 LAB — COMPREHENSIVE METABOLIC PANEL
ALT: 589 U/L — ABNORMAL HIGH (ref 0–44)
AST: 819 U/L — ABNORMAL HIGH (ref 15–41)
Albumin: 3.5 g/dL (ref 3.5–5.0)
Alkaline Phosphatase: 170 U/L — ABNORMAL HIGH (ref 38–126)
Anion gap: 16 — ABNORMAL HIGH (ref 5–15)
BUN: 33 mg/dL — ABNORMAL HIGH (ref 8–23)
CO2: 19 mmol/L — ABNORMAL LOW (ref 22–32)
Calcium: 9.3 mg/dL (ref 8.9–10.3)
Chloride: 100 mmol/L (ref 98–111)
Creatinine, Ser: 1.7 mg/dL — ABNORMAL HIGH (ref 0.44–1.00)
GFR calc Af Amer: 35 mL/min — ABNORMAL LOW (ref >60)
GFR calc non Af Amer: 30 mL/min — ABNORMAL LOW (ref >60)
Glucose, Bld: 490 mg/dL — ABNORMAL HIGH (ref 70–99)
Potassium: 5.5 mmol/L — ABNORMAL HIGH (ref 3.5–5.1)
Sodium: 135 mmol/L (ref 135–145)
Total Bilirubin: 2.1 mg/dL — ABNORMAL HIGH (ref 0.3–1.2)
Total Protein: 7.8 g/dL (ref 6.5–8.1)

## 2019-01-12 LAB — URINALYSIS, ROUTINE W REFLEX MICROSCOPIC
Bilirubin Urine: NEGATIVE
Glucose, UA: >=500 mg/dL — AB
Hgb urine dipstick: NEGATIVE
Ketones, ur: 5 mg/dL — AB
Nitrite: NEGATIVE
Protein, ur: 100 mg/dL — AB
Specific Gravity, Urine: 1.017 (ref 1.005–1.030)
pH: 8 (ref 5.0–8.0)

## 2019-01-12 LAB — BLOOD GAS, VENOUS
Acid-base deficit: 6.8 mmol/L — ABNORMAL HIGH (ref 0.0–2.0)
Bicarbonate: 19.9 mmol/L — ABNORMAL LOW (ref 20.0–28.0)
O2 Saturation: 73.6 %
Patient temperature: 98.6
pCO2, Ven: 45.6 mmHg (ref 44.0–60.0)
pH, Ven: 7.262 (ref 7.250–7.430)
pO2, Ven: 46.2 mmHg — ABNORMAL HIGH (ref 32.0–45.0)

## 2019-01-12 LAB — CBG MONITORING, ED
Glucose-Capillary: 468 mg/dL — ABNORMAL HIGH (ref 70–99)
Glucose-Capillary: 489 mg/dL — ABNORMAL HIGH (ref 70–99)
Glucose-Capillary: 392 mg/dL — ABNORMAL HIGH (ref 70–99)

## 2019-01-12 LAB — LACTIC ACID, PLASMA: Lactic Acid, Venous: 6.1 mmol/L (ref 0.5–1.9)

## 2019-01-12 LAB — BRAIN NATRIURETIC PEPTIDE: B Natriuretic Peptide: 88.3 pg/mL (ref 0.0–100.0)

## 2019-01-12 LAB — LIPASE, BLOOD: Lipase: 4497 U/L — ABNORMAL HIGH (ref 11–51)

## 2019-01-12 LAB — POC SARS CORONAVIRUS 2 AG -  ED: SARS Coronavirus 2 Ag: NEGATIVE

## 2019-01-12 MED ORDER — SODIUM CHLORIDE 0.9 % IV BOLUS
500.0000 mL | Freq: Once | INTRAVENOUS | Status: AC
  Administered 2019-01-12: 500 mL via INTRAVENOUS

## 2019-01-12 MED ORDER — ONDANSETRON HCL 4 MG/2ML IJ SOLN
4.0000 mg | Freq: Once | INTRAMUSCULAR | Status: AC
  Administered 2019-01-12: 18:00:00 4 mg via INTRAVENOUS
  Filled 2019-01-12: qty 2

## 2019-01-12 MED ORDER — METRONIDAZOLE IN NACL 5-0.79 MG/ML-% IV SOLN
500.0000 mg | Freq: Three times a day (TID) | INTRAVENOUS | Status: DC
  Administered 2019-01-13 (×3): 500 mg via INTRAVENOUS
  Filled 2019-01-12 (×3): qty 100

## 2019-01-12 MED ORDER — SODIUM CHLORIDE 0.9 % IV SOLN: 1.0000 g | Freq: Once | INTRAVENOUS | Status: DC

## 2019-01-12 MED ORDER — SODIUM CHLORIDE 0.9 % IV BOLUS
500.0000 mL | Freq: Once | INTRAVENOUS | Status: AC
  Administered 2019-01-12: 18:00:00 500 mL via INTRAVENOUS

## 2019-01-12 MED ORDER — SODIUM CHLORIDE 0.9 % IV BOLUS
1000.0000 mL | Freq: Once | INTRAVENOUS | Status: AC
  Administered 2019-01-12: 1000 mL via INTRAVENOUS

## 2019-01-12 MED ORDER — SODIUM CHLORIDE 0.9 % IV SOLN: INTRAVENOUS | Status: DC

## 2019-01-12 MED ORDER — DEXTROSE 50 % IV SOLN: 0.0000 mL | INTRAVENOUS | Status: DC | PRN

## 2019-01-12 MED ORDER — SODIUM CHLORIDE 0.9 % IV SOLN
2.0000 g | Freq: Once | INTRAVENOUS | Status: AC
  Administered 2019-01-12: 2 g via INTRAVENOUS
  Filled 2019-01-12: qty 20

## 2019-01-12 MED ORDER — DEXTROSE-NACL 5-0.45 % IV SOLN: INTRAVENOUS | Status: DC

## 2019-01-12 MED ORDER — LATANOPROST 0.005 % OP SOLN
1.0000 [drp] | Freq: Every day | OPHTHALMIC | Status: DC
  Administered 2019-01-13 – 2019-01-20 (×9): 1 [drp] via OPHTHALMIC
  Filled 2019-01-12 (×3): qty 2.5

## 2019-01-12 MED ORDER — INSULIN REGULAR(HUMAN) IN NACL 100-0.9 UT/100ML-% IV SOLN
INTRAVENOUS | Status: DC
  Administered 2019-01-12: 13 [IU]/h via INTRAVENOUS
  Administered 2019-01-13: 2.2 [IU]/h via INTRAVENOUS
  Administered 2019-01-13: 1.3 [IU]/h via INTRAVENOUS
  Filled 2019-01-12 (×2): qty 100

## 2019-01-12 MED ORDER — INSULIN REGULAR(HUMAN) IN NACL 100-0.9 UT/100ML-% IV SOLN
INTRAVENOUS | Status: DC
  Administered 2019-01-12: 13 [IU]/h via INTRAVENOUS
  Filled 2019-01-12: qty 100

## 2019-01-12 MED ORDER — SODIUM CHLORIDE 0.9 % IV SOLN: 2.0000 g | INTRAVENOUS | Status: DC

## 2019-01-12 MED ORDER — ALBUTEROL SULFATE HFA 108 (90 BASE) MCG/ACT IN AERS: 2.0000 | INHALATION_SPRAY | Freq: Four times a day (QID) | RESPIRATORY_TRACT | Status: DC | PRN

## 2019-01-12 NOTE — ED Notes (Signed)
Patient transported to CT 

## 2019-01-12 NOTE — H&P (Signed)
History and Physical    [name] [id number] DOB: Jul 27, 1950 DOA: 01/12/2019  PCP: Everardo Beals, NP  Patient coming from: Home.  Chief Complaint: Abdominal pain.  HPI: [name] is a 69 y.o. female with history of CAD status post CABG, chronic systolic heart failure status post AICD with history of V. tach, diabetes mellitus type 2, chronic kidney disease stage III, history of DVT on anticoagulation presents to the ER because of worsening abdominal pain.  Patient states she has been having generalized abdominal pain mostly in the epigastric area with nausea vomiting over the last 3 weeks.  Was able to take her medications.  Pain acutely worsened over the last 24 to 48 hours.  Denies any blood in the vomitus.  Denies fever chills chest pain or shortness of breath.  ED Course: In the ER patient was afebrile.  Creatinine was 1.7 potassium was 5.5 lipase was 4400 AST 819 ALT 589 total bilirubin 2.1 high-sensitivity troponin was 86 EKG shows sinus rhythm with nonspecific ST-T changes WBC count was 13.4 lactic acid of 6.1 which improved to 0.9.  CT of the abdomen pelvis shows features concerning for acute pancreatitis the differentials include stomach or colon pathologies.  Since patient also had elevated blood sugar with elevated anion gap patient was started on insulin drip for possible DKA.  Empiric antibiotics were started IV fluids were started admitted for acute pancreatitis with gallstones.  Review of Systems: As per HPI, rest all negative.   Past Medical History:  Diagnosis Date  . AICD (automatic cardioverter/defibrillator) present   . Angina decubitus ([place]) 11/06/2012  . Angina pectoris ([place])   . Anxiety   . Arthritis   . Back pain   . Bundle branch block 05/2016  . CHF (congestive heart failure) ([place])   . Chronic combined systolic and diastolic CHF, NYHA class 3 ([employer])   . Coronary artery disease    s/p CABG 2007  . Depression   . Dermal mycosis   . Diabetes  mellitus    type II  . DVT (deep venous thrombosis) ([place]) 09/2015   bilateral  . Edema, lower extremity   . Glaucoma   . Hyperlipidemia   . Hypertension   . Insomnia   . Ischemic cardiomyopathy    EF 30-35%, s/p ICD 4/08 by Dr Leonia Reeves  . Joint pain   . Morbid obesity ([place]) 11/06/2012  . Obesity   . Oxygen dependent 2 L  . Pain in left knee   . Peripheral neuropathy   . 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 Sleep apnea    uses O2 at night  . SOB (shortness of breath)   . Tinea   . Ventricular tachycardia ([place]) 01/16/15   sustained VT terminated with ATP, CL 340 msec    Past Surgical History:  Procedure Laterality Date  . BI-VENTRICULAR IMPLANTABLE CARDIOVERTER DEFIBRILLATOR UPGRADE N/A 09/25/2012   Procedure: BI-VENTRICULAR IMPLANTABLE CARDIOVERTER DEFIBRILLATOR UPGRADE;  Surgeon: Coralyn Mark, MD;  Location: [facility] CATH LAB;  Service: Cardiovascular;  Laterality: N/A;  . BREAST BIOPSY Right 04/05/2014  . CARDIAC CATHETERIZATION    . CARDIAC DEFIBRILLATOR PLACEMENT  4/08   by Dr Leonia Reeves (MDT)  . COLONOSCOPY WITH PROPOFOL N/A 10/12/2016   Procedure: COLONOSCOPY WITH PROPOFOL;  Surgeon: Doran Stabler, MD;  Location: [facility] ENDOSCOPY;  Service: Gastroenterology;  Laterality: N/A;  .  CORONARY ARTERY BYPASS GRAFT  10/05/2005   by Dr Cyndia Bent  . IMPLANTABLE CARDIOVERTER DEFIBRILLATOR IMPLANT  09/25/12   attempt of upgrade to CRT-D unsuccessful due to CS anatomy, SJM Unify Asaura device placed with LV port capped by Dr Rayann Heman  . IR GENERIC HISTORICAL  10/03/2015   IR US GUIDE VASC ACCESS LEFT 10/03/2015 Jacqulynn Cadet, MD [facility]  . IR GENERIC HISTORICAL  10/03/2015   IR VENO/EXT/UNI LEFT 10/03/2015 Jacqulynn Cadet, MD [facility]  . IR GENERIC HISTORICAL  10/03/2015   IR VENOCAVAGRAM IVC 10/03/2015 Jacqulynn Cadet, MD [facility]  . IR GENERIC HISTORICAL  10/03/2015   IR INFUSION  THROMBOL VENOUS INITIAL (MS) 10/03/2015 Jacqulynn Cadet, MD [facility]  . IR GENERIC HISTORICAL  10/03/2015   IR US GUIDE VASC ACCESS LEFT 10/03/2015 Jacqulynn Cadet, MD [facility]  . IR GENERIC HISTORICAL  10/04/2015   IR THROMBECT VENO MECH MOD SED 10/04/2015 Sandi Mariscal, MD [facility]  . IR GENERIC HISTORICAL  10/04/2015   IR TRANSCATH PLC STENT 1ST ART NOT LE CV CAR VERT CAR 10/04/2015 Sandi Mariscal, MD [facility]  . IR GENERIC HISTORICAL  10/04/2015   IR THROMB F/U EVAL ART/VEN FINAL DAY (MS) 10/04/2015 Sandi Mariscal, MD [facility]  . IR GENERIC HISTORICAL  11/02/2015   IR RADIOLOGIST EVAL & MGMT 11/02/2015 Sandi Mariscal, MD [facility]  . IR RADIOLOGIST EVAL & MGMT  04/26/2016     reports that she has never smoked. She has never used smokeless tobacco. She reports that she does not drink alcohol or use drugs.  Allergies  Allergen Reactions  . Lipitor  [Atorvastatin Calcium] Rash  . Metformin And Related Itching, Swelling and Other (See Comments)    Leg pain & swelling in legs  . Atorvastatin Itching and Rash  . Levofloxacin Itching    Family History  Problem Relation Age of Onset  . Diabetes Mother 70       died - HTN  . Stroke Mother   . High blood pressure Mother   . Sudden death Mother   . Obesity Mother   . Other Other        No early family hx of CAD    Prior to Admission medications   Medication Sig Start Date End Date Taking? Authorizing Provider  albuterol (PROVENTIL HFA;VENTOLIN HFA) 108 (90 BASE) MCG/ACT inhaler Inhale 2 puffs into the lungs every 6 (six) hours as needed for wheezing or shortness of breath.   Yes [provider]  albuterol (PROVENTIL) (2.5 MG/3ML) 0.083% nebulizer solution Take 2.5 mg by nebulization every 6 (six) hours as needed for wheezing or shortness of breath.  09/30/15  Yes [provider]  amitriptyline (ELAVIL) 10 MG tablet Take 10 mg by mouth daily. 07/25/16  Yes [provider]  Capsaicin (MUSCLE RELIEF  EX) Apply 1 application topically 3 (three) times daily as needed. For pain 08/09/16  Yes [provider]  carvedilol (COREG) 12.5 MG tablet Take 12.5 mg by mouth 2 (two) times daily with a meal.  03/30/15  Yes [provider]  ferrous sulfate 300 (60 Fe) MG/5ML syrup Take 300 mg by mouth daily with lunch.   Yes [provider]  furosemide (LASIX) 80 MG tablet TAKE 1 TABLET TWICE DAILY Patient taking differently: Take 80 mg by mouth 2 (two) times daily.  12/31/18  Yes Weaver, Scott T, PA-C  glipiZIDE (GLUCOTROL) 5 MG tablet Take 5 mg by mouth daily before breakfast.    Yes  [provider]  isosorbide mononitrate (IMDUR) 30 MG 24 hr tablet TAKE 1 TABLET (30 MG TOTAL) BY MOUTH DAILY. 12/31/18  Yes Weaver, Scott T, PA-C  ketoconazole (NIZORAL) 2 % cream Apply 1 application topically 2 (two) times daily as needed (for skin irritation).  03/30/15  Yes [provider]  latanoprost (XALATAN) 0.005 % ophthalmic solution Place 1 drop into both eyes at bedtime.   Yes [provider]  LEVEMIR FLEXTOUCH 100 UNIT/ML Pen Inject 15-20 Units into the skin at bedtime. Patient states she takes every other day in the pm.  Patient takes 15-20 units if glucose is greater than 140 per patient 10/19/15  Yes [provider]  lisinopril (PRINIVIL,ZESTRIL) 10 MG tablet Take 10 mg by mouth daily.    Yes [provider]  methocarbamol (ROBAXIN) 500 MG tablet Take 1 tablet (500 mg total) by mouth every 8 (eight) hours as needed for muscle spasms. 05/30/14  Yes Barton Dubois, MD  nitroGLYCERIN (NITROSTAT) 0.4 MG SL tablet Place 0.4 mg under the tongue every 5 (five) minutes as needed for chest pain.   Yes [provider]  NOVOLOG FLEXPEN 100 UNIT/ML FlexPen Inject 30-50 Units into the skin See admin instructions. 50 units every morning, 25 units at lunch and 50 units at supper *Hold for low blood sugar* 09/12/15  Yes [provider]  oxybutynin  (DITROPAN-XL) 10 MG 24 hr tablet Take 10 mg by mouth daily. 01/12/18  Yes [provider]  Potassium Chloride ER 20 MEQ TBCR Take 20 mEq by mouth daily. 07/04/16  Yes [provider]  rosuvastatin (CRESTOR) 20 MG tablet Take 1 tablet (20 mg total) by mouth daily at 6 PM. 10/07/15  Yes Minus Liberty, MD  Semaglutide (OZEMPIC, 0.25 OR 0.5 MG/DOSE, [place]) Inject 0.5 mg into the skin once a week.    Yes [provider]  spironolactone (ALDACTONE) 25 MG tablet Take 25 mg by mouth daily.   Yes [provider]  tolterodine (DETROL LA) 4 MG 24 hr capsule Take 4 mg by mouth daily. 01/12/18  Yes [provider]  traMADol (ULTRAM) 50 MG tablet Take 50 mg by mouth 2 (two) times daily. 11/28/15  Yes [provider]  Vitamin D, Ergocalciferol, (DRISDOL) 1.25 MG (50000 UT) CAPS capsule Take 1 capsule (50,000 Units total) by mouth every 7 (seven) days. 12/30/18  Yes Jearld Lesch A, DO  XARELTO 20 MG TABS tablet Take 20 mg by mouth daily.  12/14/15  Yes [provider]    Physical Exam: Constitutional: Moderately built and nourished. Vitals:   01/12/19 2200 01/12/19 2230 01/12/19 2300 01/12/19 2330  BP: (!) 160/80 (!) 157/72 (!) 156/69 (!) 162/85  Pulse: 99 100 99 (!) 101  Resp: (!) 35 (!) 36 (!) 26 (!) 35  Temp:      TempSrc:      SpO2: 98% 93% 93% 94%  Weight:      Height:       Eyes: Anicteric no pallor. ENMT: No discharge from the ears eyes nose or mouth. Neck: No mass felt.  No neck rigidity. Respiratory: No rhonchi or crepitations. Cardiovascular: S1-S2 heard. Abdomen: Soft nontender bowel sounds present. Musculoskeletal: No edema.  No joint effusion. Skin: No rash. Neurologic: Alert awake oriented to time place and person.  Moves all extremities. Psychiatric: Appears normal per normal affect.   Labs on Admission: I have personally reviewed following labs and imaging studies  CBC: Recent Labs  Lab 01/12/19 1857  WBC 13.4*  NEUTROABS 12.3*  HGB 14.6  HCT 45.2  MCV 101.3*  PLT 123456   Basic Metabolic Panel: Recent Labs  Lab 01/12/19 1857  NA 135  K 5.5*  CL 100  CO2 19*  GLUCOSE 490*  BUN 33*  CREATININE 1.70*  CALCIUM 9.3   GFR: Estimated Creatinine Clearance: 36.5 mL/min (A) (by C-G formula based on SCr of 1.7 mg/dL (H)). Liver Function Tests: Recent Labs  Lab 01/12/19 1857  AST 819*  ALT 589*  ALKPHOS 170*  BILITOT 2.1*  PROT 7.8  ALBUMIN 3.5   Recent Labs  Lab 01/12/19 1857  LIPASE 4,497*   No results for input(s): AMMONIA in the last 168 hours. Coagulation Profile: No results for input(s): INR, PROTIME in the last 168 hours. Cardiac Enzymes: No results for input(s): CKTOTAL, CKMB, CKMBINDEX, TROPONINI in the last 168 hours. BNP (last 3 results) No results for input(s): PROBNP in the last 8760 hours. HbA1C: No results for input(s): HGBA1C in the last 72 hours. CBG: Recent Labs  Lab 01/12/19 1738 01/12/19 2017 01/12/19 2301  GLUCAP 468* 489* 392*   Lipid Profile: No results for input(s): CHOL, HDL, LDLCALC, TRIG, CHOLHDL, LDLDIRECT in the last 72 hours. Thyroid Function Tests: No results for input(s): TSH, T4TOTAL, FREET4, T3FREE, THYROIDAB in the last 72 hours. Anemia Panel: No results for input(s): VITAMINB12, FOLATE, FERRITIN, TIBC, IRON, RETICCTPCT in the last 72 hours. Urine analysis:    Component Value Date/Time   COLORURINE YELLOW 01/12/2019 2045   APPEARANCEUR HAZY (A) 01/12/2019 2045   LABSPEC 1.017 01/12/2019 2045   PHURINE 8.0 01/12/2019 2045   GLUCOSEU >=500 (A) 01/12/2019 2045   HGBUR NEGATIVE 01/12/2019 2045   BILIRUBINUR NEGATIVE 01/12/2019 2045   KETONESUR 5 (A) 01/12/2019 2045   PROTEINUR 100 (A) 01/12/2019 2045   UROBILINOGEN 1.0 05/25/2014 2027   NITRITE NEGATIVE 01/12/2019 2045   LEUKOCYTESUR LARGE (A) 01/12/2019 2045   Sepsis Labs: @LABRCNTIP (procalcitonin:4,lacticidven:4) )No results found for this or any previous visit (from the past  240 hour(s)).   Radiological Exams on Admission: CT ABDOMEN PELVIS WO CONTRAST  Result Date: 01/12/2019 CLINICAL DATA:  Nausea and vomiting. EXAM: CT ABDOMEN AND PELVIS WITHOUT CONTRAST TECHNIQUE: Multidetector CT imaging of the abdomen and pelvis was performed following the standard protocol without IV contrast. COMPARISON:  May 26, 2014 FINDINGS: Lower chest: Multiple sternal wires are noted. Mild areas of atelectasis and/or infiltrate are seen within the bilateral lung bases. Hepatobiliary: No focal liver abnormality is seen. Numerous subcentimeter gallstones are seen within a mildly distended gallbladder. Pancreas: There is a moderate to marked amount of mesenteric inflammatory fat stranding within the upper abdomen. This involves the peripancreatic region and extends superiorly along the perigastric region. Inferior extension is seen surrounding the descending colon. Spleen: Normal in size without focal abnormality. Adrenals/Urinary Tract: Adrenal glands are unremarkable. Kidneys are normal in size, without focal lesion or hydronephrosis. A 1.1 cm cluster of subcentimeter non-obstructing renal stones are seen within the mid right kidney. Bladder is unremarkable. Stomach/Bowel: There is a small hiatal hernia. Appendix appears normal. No evidence of bowel wall thickening or distention. Vascular/Lymphatic: There is marked severity aortic atherosclerosis. A stent is seen within the left common iliac vein. No enlarged abdominal or pelvic lymph nodes. Reproductive: The uterus is unremarkable. A 4.9 cm x 3.8 cm cystic appearing areas seen within the right adnexa. Other: A 3.9 cm x 2.9 cm fat containing periumbilical hernia is seen. Musculoskeletal: No acute or significant osseous findings. IMPRESSION: 1. Moderate to marked severity  inflammatory process within the upper abdomen. This may be consistent acute pancreatitis, however, given the pattern of distribution of inflammatory fat stranding, associated  inflammatory processes involving the stomach and/or descending colon cannot be excluded. 2. Cholelithiasis. 3. Right adnexal cyst, likely ovarian in origin. Electronically Signed   By: Virgina Norfolk M.D.   On: 01/12/2019 21:19   DG Chest Portable 1 View  Result Date: 01/12/2019 CLINICAL DATA:  Shortness of breath. Persistent vomiting. EXAM: PORTABLE CHEST 1 VIEW COMPARISON:  10/05/2015 FINDINGS: Heart size and pulmonary vascularity are within normal limits considering the AP portable technique. Aortic atherosclerosis. AICD. New slight bibasilar atelectasis. No consolidative infiltrates or effusions. No acute bone abnormality. CABG. IMPRESSION: New slight bibasilar atelectasis. Aortic Atherosclerosis (ICD10-I70.0). Electronically Signed   By: Lorriane Shire M.D.   On: 01/12/2019 18:34    EKG: Independently reviewed.  Normal sinus rhythm.  Assessment/Plan Principal Problem:   Acute pancreatitis Active Problems:   S/P CABG x 5   Chronic combined systolic and diastolic heart failure (H[facility])   AKI (acute kidney injury) ([place])   DVT (deep venous thrombosis) ([facility])   DKA (diabetic ketoacidoses) ([place])    1. Acute pancreatitis likely related to gallstones.  We will keep patient n.p.o. empiric antibiotics consult GI in the morning.  Follow LFTs.  Since patient has AICD will not be able to do MRI.  Follow intake output metabolic panel.  Patient is receiving IV fluids now that patient does have history of systolic heart failure for which closely observe respiratory status.  In the CT scan other differentials include stomach and colon pathologies.  Will need GI input.  But given the elevated LFTs and lipase likely pancreatitis. 2. Possible DKA on IV insulin infusion follow metabolic panel closely.  Patient is on DKA protocol. 3. Acute on chronic kidney disease stage III likely from vomiting and poor oral intake.  Follow metabolic panel after hydration. 4. Possible UTI on empiric antibiotics. 5. History of  CAD denies any chest pain.  Will check cardiac markers. 6. History of DVT we will keep patient on IV heparin until patient can take orally. 7. History of chronic combined systolic and diastolic CHF presently receiving fluids.  Closely follow respiratory status. 8. Lactic acidosis likely from dehydration from receiving fluids follow lactate levels. 9. Markedly elevated LFTs due to possible gallstone pancreatitis.  Follow LFTs.  Given the acute pancreatitis with other comorbidities will need close monitoring for further deterioration and will need inpatient status.  COVID-19 test is pending.   DVT prophylaxis: IV heparin. Code Status: Full code. Family Communication: Discussed with patient. Disposition Plan: Home. Consults called: None. Admission status: Inpatient.   Rise Patience MD [facility] Pager [phone].  If 7PM-7AM, please contact night-coverage [email or web address] Password [facility]  01/12/2019, 11:38 PM

## 2019-01-12 NOTE — ED Triage Notes (Signed)
Arrives via EMS from home, C/C vomiting since 3 am yesterday, assumed it was food poisoning bc she had leftovers from New Years. Has not vomited in a few hours, but feeling unwell, CBG 549. Last took Novalog and glipizide yesterday, was not able to take it today.

## 2019-01-12 NOTE — ED Notes (Signed)
CBG 489, Leafy Kindle, RN notified

## 2019-01-12 NOTE — ED Notes (Signed)
Phlebotomy called to stick for blood, unable to get second IV, attempted x2 and EMS IV will not draw back.

## 2019-01-12 NOTE — ED Notes (Signed)
Date and time results received: 01/12/19 7:49 PM (use smartphrase ".now" to insert current time)  Test: Lactic Acid Critical Value: 6.1  Name of Provider Notified: Rancour, MD

## 2019-01-12 NOTE — ED Provider Notes (Signed)
[facility] DEPT Provider Note   CSN: [id number] Arrival date & time: 01/12/19  1722     History Chief Complaint  Patient presents with  . Nausea  . Hyperglycemia    [name] is a 69 y.o. female.  Patient with multiple medical problems including CAD, ischemic cardiomyopathy with EF of 35% AICD in place, diabetes, DVT on Xarelto, morbid obesity, hypertension hyperlipidemia presenting with nausea and vomiting.  States she been vomiting nonstop since about 3 AM.  She estimates she has vomited about 6 or 7 times that has been nonbloody and nonbilious.  She is concerned for food poisoning as she had but had some leftover food.  Did have one loose stool.  But no significant diarrhea.  No fever.  No other sick contacts.  States her CBG was 549.  Did not take her insulin today.  Complains of diffuse abdominal pain that started before the vomiting.  Denies any chest pain, cough or shortness of breath.  No leg pain or leg swelling.  The history is provided by the patient and the EMS personnel.  Hyperglycemia Associated symptoms: abdominal pain, fatigue, nausea, shortness of breath, vomiting and weakness   Associated symptoms: no chest pain, no dizziness, no dysuria and no fever        Past Medical History:  Diagnosis Date  . AICD (automatic cardioverter/defibrillator) present   . Angina decubitus ([place]) 11/06/2012  . Angina pectoris ([place])   . Anxiety   . Arthritis   . Back pain   . Bundle branch block 05/2016  . CHF (congestive heart failure) ([place])   . Chronic combined systolic and diastolic CHF, NYHA class 3 ([place])   . Coronary artery disease    s/p CABG 2007  . Depression   . Dermal mycosis   . Diabetes mellitus    type II  . DVT (deep venous thrombosis) ([place]) 09/2015   bilateral  . Edema, lower extremity   . Glaucoma   . Hyperlipidemia   . Hypertension   . Insomnia   . Ischemic cardiomyopathy    EF 30-35%, s/p ICD 4/08 by Dr Leonia Reeves  .  Joint pain   . Morbid obesity ([place]) 11/06/2012  . Obesity   . Oxygen dependent 2 L  . Pain in left knee   . Peripheral neuropathy   . 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 Sleep apnea    uses O2 at night  . SOB (shortness of breath)   . Tinea   . Ventricular tachycardia ([place]) 01/16/15   sustained VT terminated with ATP, CL 340 msec    Patient Active Problem List   Diagnosis Date Noted  . Vitamin D deficiency 11/05/2018  . Vitamin B 12 deficiency 11/05/2018  . Tinea   . Sleep apnea   . Peripheral neuropathy   . Oxygen dependent   . Obesity   . Insomnia   . Hyperlipidemia   . Dermal mycosis   . Depression   . Coronary artery disease   . CHF (congestive heart failure) ([place])   . Arthritis   . Anxiety   . AICD (automatic cardioverter/defibrillator) present   . Heme positive stool   . Benign neoplasm of transverse colon   . Bundle branch block 05/08/2016  . Left leg pain   . DVT (deep venous thrombosis) ([place]) 09/30/2015  . Syncope 03/11/2015  .  AKI (acute kidney injury) ([place]) 03/11/2015  . Hypotension 03/11/2015  . Syncope and collapse 03/11/2015  . Ventricular tachycardia ([place]) 01/17/2015  . Pain in the chest   . SOB (shortness of breath)   . Thyroid nodule   . CAP (community acquired pneumonia)   . Chest pain 05/26/2014  . Right flank pain 05/26/2014  . Right flank discomfort   . Chronic combined systolic and diastolic heart failure ([place])   . Angina pectoris ([place])   . Morbid obesity ([place]) 11/06/2012  . Angina decubitus ([place]) 11/06/2012  . LBBB (left bundle branch block) 09/24/2012  . Chronic systolic dysfunction of left ventricle 09/24/2012  . Hypoglycemia 09/09/2012  . HLD (hyperlipidemia) 04/23/2012  . Insulin dependent diabetes mellitus ([place]) 04/23/2012  . Acute renal failure ([place]) 03/10/2012  . Ischemic cardiomyopathy 06/18/2010  . CAD (coronary artery disease)  06/18/2010  . Hypertension 06/18/2010  . 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    Past Surgical History:  Procedure Laterality Date  . BI-VENTRICULAR IMPLANTABLE CARDIOVERTER DEFIBRILLATOR UPGRADE N/A 09/25/2012   Procedure: BI-VENTRICULAR IMPLANTABLE CARDIOVERTER DEFIBRILLATOR UPGRADE;  Surgeon: Coralyn Mark, MD;  Location: [facility] CATH LAB;  Service: Cardiovascular;  Laterality: N/A;  . BREAST BIOPSY Right 04/05/2014  . CARDIAC CATHETERIZATION    . CARDIAC DEFIBRILLATOR PLACEMENT  4/08   by Dr Leonia Reeves (MDT)  . COLONOSCOPY WITH PROPOFOL N/A 10/12/2016   Procedure: COLONOSCOPY WITH PROPOFOL;  Surgeon: Doran Stabler, MD;  Location: [facility] ENDOSCOPY;  Service: Gastroenterology;  Laterality: N/A;  . CORONARY ARTERY BYPASS GRAFT  10/05/2005   by Dr Cyndia Bent  . IMPLANTABLE CARDIOVERTER DEFIBRILLATOR IMPLANT  09/25/12   attempt of upgrade to CRT-D unsuccessful due to CS anatomy, SJM Unify Asaura device placed with LV port capped by Dr Rayann Heman  . IR GENERIC HISTORICAL  10/03/2015   IR US GUIDE VASC ACCESS LEFT 10/03/2015 Jacqulynn Cadet, MD [facility]  . IR GENERIC HISTORICAL  10/03/2015   IR VENO/EXT/UNI LEFT 10/03/2015 Jacqulynn Cadet, MD [facility]  . IR GENERIC HISTORICAL  10/03/2015   IR VENOCAVAGRAM IVC 10/03/2015 Jacqulynn Cadet, MD [facility]  . IR GENERIC HISTORICAL  10/03/2015   IR INFUSION THROMBOL VENOUS INITIAL (MS) 10/03/2015 Jacqulynn Cadet, MD [facility]  . IR GENERIC HISTORICAL  10/03/2015   IR US GUIDE VASC ACCESS LEFT 10/03/2015 Jacqulynn Cadet, MD [facility]  . IR GENERIC HISTORICAL  10/04/2015   IR THROMBECT VENO MECH MOD SED 10/04/2015 Sandi Mariscal, MD [facility]  . IR GENERIC HISTORICAL  10/04/2015   IR TRANSCATH PLC STENT 1ST ART NOT LE CV CAR VERT CAR 10/04/2015 Sandi Mariscal, MD [facility]  . IR GENERIC HISTORICAL  10/04/2015   IR THROMB F/U EVAL ART/VEN FINAL DAY (MS) 10/04/2015 Sandi Mariscal, MD [facility]  . IR GENERIC HISTORICAL  11/02/2015   IR RADIOLOGIST EVAL & MGMT  11/02/2015 Sandi Mariscal, MD [facility]  . IR RADIOLOGIST EVAL & MGMT  04/26/2016     OB History    Gravida  4   Para  4   Term      Preterm      AB      Living        SAB      TAB      Ectopic      Multiple      Live Births              Family History  Problem Relation Age of Onset  . Diabetes Mother  44       died - HTN  . Stroke Mother   . High blood pressure Mother   . Sudden death Mother   . Obesity Mother   . Other Other        No early family hx of CAD    Social History   Tobacco Use  . Smoking status: Never Smoker  . Smokeless tobacco: Never Used  Substance Use Topics  . Alcohol use: No  . Drug use: No    Home Medications Prior to Admission medications   Medication Sig Start Date End Date Taking? Authorizing Provider  albuterol (PROVENTIL HFA;VENTOLIN HFA) 108 (90 BASE) MCG/ACT inhaler Inhale 2 puffs into the lungs every 6 (six) hours as needed for wheezing or shortness of breath.    [provider]  albuterol (PROVENTIL) (2.5 MG/3ML) 0.083% nebulizer solution Take 2.5 mg by nebulization every 6 (six) hours as needed for wheezing or shortness of breath.  09/30/15   [provider]  amitriptyline (ELAVIL) 10 MG tablet Take 10 mg by mouth daily. 07/25/16   [provider]  Capsaicin (MUSCLE RELIEF EX) Apply 1 application topically 3 (three) times daily as needed. For pain 08/09/16   [provider]  carvedilol (COREG) 12.5 MG tablet Take 12.5 mg by mouth 2 (two) times daily with a meal.  03/30/15   [provider]  ferrous sulfate 300 (60 Fe) MG/5ML syrup Take 300 mg by mouth daily with lunch.    [provider]  furosemide (LASIX) 80 MG tablet TAKE 1 TABLET TWICE DAILY 12/31/18   [name] T, PA-C  glipiZIDE (GLUCOTROL) 5 MG tablet Take 5 mg by mouth daily before breakfast.     [provider]  ibuprofen (ADVIL,MOTRIN) 200 MG tablet Take 200 mg by mouth every 8 (eight) hours as  needed (for pain).    [provider]  isosorbide mononitrate (IMDUR) 30 MG 24 hr tablet TAKE 1 TABLET (30 MG TOTAL) BY MOUTH DAILY. 12/31/18   [name] T, PA-C  ketoconazole (NIZORAL) 2 % cream Apply 1 application topically 2 (two) times daily as needed (for skin irritation).  03/30/15   [provider]  latanoprost (XALATAN) 0.005 % ophthalmic solution Place 1 drop into both eyes at bedtime.    [provider]  LEVEMIR FLEXTOUCH 100 UNIT/ML Pen Inject 15-20 Units into the skin at bedtime. Patient states she takes every other day in the pm.  Patient takes 15-20 units if glucose is greater than 140 per patient 10/19/15   [provider]  lisinopril (PRINIVIL,ZESTRIL) 10 MG tablet Take 10 mg by mouth daily.     [provider]  methocarbamol (ROBAXIN) 500 MG tablet Take 1 tablet (500 mg total) by mouth every 8 (eight) hours as needed for muscle spasms. 05/30/14   Barton Dubois, MD  nitroGLYCERIN (NITROSTAT) 0.4 MG SL tablet Place 0.4 mg under the tongue every 5 (five) minutes as needed for chest pain.    [provider]  NOVOLOG FLEXPEN 100 UNIT/ML FlexPen Inject 30-50 Units into the skin See admin instructions. 50 units every morning, 25 units at lunch and 50 units at supper *Hold for low blood sugar* 09/12/15   [provider]  oxybutynin (DITROPAN-XL) 10 MG 24 hr tablet Take 10 mg by mouth daily. 01/12/18   [provider]  Potassium Chloride ER 20 MEQ TBCR Take 20 mEq by mouth daily. 07/04/16   [provider]  rosuvastatin (CRESTOR) 20 MG tablet Take  1 tablet (20 mg total) by mouth daily at 6 PM. 10/07/15   Minus Liberty, MD  Semaglutide (OZEMPIC, 0.25 OR 0.5 MG/DOSE, [place]) Inject into the skin.    [provider]  spironolactone (ALDACTONE) 25 MG tablet Take 25 mg by mouth daily.    [provider]  tolterodine (DETROL LA) 4 MG 24 hr capsule Take 4 mg by mouth daily. 01/12/18   [provider]  traMADol (ULTRAM) 50 MG tablet Take 50 mg by mouth 2 (two) times daily. 11/28/15   [provider]  Vitamin D, Ergocalciferol, (DRISDOL) 1.25 MG (50000 UT) CAPS capsule Take 1 capsule (50,000 Units total) by mouth every 7 (seven) days. 12/30/18   Jearld Lesch A, DO  XARELTO 20 MG TABS tablet Take 20 mg by mouth daily.  12/14/15   [provider]    Allergies    Lipitor  [atorvastatin calcium], Metformin and related, Atorvastatin, and Levofloxacin  Review of Systems   Review of Systems  Constitutional: Positive for activity change, appetite change and fatigue. Negative for fever.  HENT: Negative for congestion and rhinorrhea.   Eyes: Negative for visual disturbance.  Respiratory: Positive for shortness of breath.   Cardiovascular: Negative for chest pain.  Gastrointestinal: Positive for abdominal pain, nausea and vomiting.  Genitourinary: Negative for dysuria and hematuria.  Skin: Negative for rash.  Neurological: Positive for weakness. Negative for dizziness and headaches.   all other systems are negative except as noted in the HPI and PMH.    Physical Exam Updated Vital Signs BP (!) 159/85   Pulse 99   Temp (!) 97.4 F (36.3 C) (Oral)   Resp 18   Ht 5\' 3"  (1.6 m)   Wt 103.9 kg   SpO2 96%   BMI 40.57 kg/m   Physical Exam Vitals and nursing note reviewed.  Constitutional:      General: She is not in acute distress.    Appearance: She is well-developed. She is obese. She is ill-appearing.     Comments: Tachypneic to the 30s  HENT:     Head: Normocephalic and atraumatic.     Mouth/Throat:     Pharynx: No oropharyngeal exudate.  Eyes:     Conjunctiva/sclera: Conjunctivae normal.     Pupils: Pupils are equal, round, and reactive to light.  Neck:     Comments: No meningismus. Cardiovascular:     Rate and Rhythm: Normal rate and regular rhythm.     Heart sounds: Normal heart sounds. No murmur.  Pulmonary:     Effort: Pulmonary effort is normal. No  respiratory distress.     Breath sounds: Normal breath sounds.     Comments: Diminished breath sounds on anterior exam Abdominal:     Palpations: Abdomen is soft.     Tenderness: There is abdominal tenderness. There is no guarding or rebound.     Comments: Obese abdomen, soft, diffusely tender, no guarding or rebound  Musculoskeletal:        General: No tenderness. Normal range of motion.     Cervical back: Normal range of motion and neck supple.  Skin:    General: Skin is warm.  Neurological:     Mental Status: She is alert and oriented to person, place, and time.     Cranial Nerves: No cranial nerve deficit.     Motor: No abnormal muscle tone.     Coordination: Coordination normal.     Comments:  5/5 strength throughout. CN 2-12 intact.Equal grip strength.  Psychiatric:        Behavior: Behavior normal.     ED Results / Procedures / Treatments   Labs (all labs ordered are listed, but only abnormal results are displayed) Labs Reviewed  CBC WITH DIFFERENTIAL/PLATELET - Abnormal; Notable for the following components:      Result Value   WBC 13.4 (*)    MCV 101.3 (*)    Neutro Abs 12.3 (*)    All other components within normal limits  COMPREHENSIVE METABOLIC PANEL - Abnormal; Notable for the following components:   Potassium 5.5 (*)    CO2 19 (*)    Glucose, Bld 490 (*)    BUN 33 (*)    Creatinine, Ser 1.70 (*)    AST 819 (*)    ALT 589 (*)    Alkaline Phosphatase 170 (*)    Total Bilirubin 2.1 (*)    GFR calc non Af Amer 30 (*)    GFR calc Af Amer 35 (*)    Anion gap 16 (*)    All other components within normal limits  LACTIC ACID, PLASMA - Abnormal; Notable for the following components:   Lactic Acid, Venous 6.1 (*)    All other components within normal limits  BLOOD GAS, VENOUS - Abnormal; Notable for the following components:   pO2, Ven 46.2 (*)    Bicarbonate 19.9 (*)    Acid-base deficit 6.8 (*)    All other components within normal limits  LIPASE, BLOOD -  Abnormal; Notable for the following components:   Lipase 4,497 (*)    All other components within normal limits  URINALYSIS, ROUTINE W REFLEX MICROSCOPIC - Abnormal; Notable for the following components:   APPearance HAZY (*)    Glucose, UA >=500 (*)    Ketones, ur 5 (*)    Protein, ur 100 (*)    Leukocytes,Ua LARGE (*)    Bacteria, UA RARE (*)    All other components within normal limits  CBG MONITORING, ED - Abnormal; Notable for the following components:   Glucose-Capillary 468 (*)    All other components within normal limits  CBG MONITORING, ED - Abnormal; Notable for the following components:   Glucose-Capillary 489 (*)    All other components within normal limits  TROPONIN I (HIGH SENSITIVITY) - Abnormal; Notable for the following components:   Troponin I (High Sensitivity) 86 (*)    All other components within normal limits  SARS CORONAVIRUS 2 (TAT 6-24 HRS)  URINE CULTURE  BRAIN NATRIURETIC PEPTIDE  BETA-HYDROXYBUTYRIC ACID  BETA-HYDROXYBUTYRIC ACID  POC SARS CORONAVIRUS 2 AG -  ED  TROPONIN I (HIGH SENSITIVITY)    EKG EKG Interpretation  Date/Time:  Monday January 12 2019 17:42:49 EST Ventricular Rate:  99 PR Interval:    QRS Duration: 153 QT Interval:  464 QTC Calculation: 596 R Axis:   -9 Text Interpretation: Sinus rhythm RAE, consider biatrial enlargement Nonspecific intraventricular conduction delay Baseline wander in lead(s) V5 No significant change was found Confirmed by Ezequiel Essex [phone]) on 01/12/2019 5:54:45 PM   Radiology CT ABDOMEN PELVIS WO CONTRAST  Result Date: 01/12/2019 CLINICAL DATA:  Nausea and vomiting. EXAM: CT ABDOMEN AND PELVIS WITHOUT CONTRAST TECHNIQUE: Multidetector CT imaging of the abdomen and pelvis was performed following the standard protocol without IV contrast. COMPARISON:  May 26, 2014 FINDINGS: Lower chest: Multiple sternal wires are noted. Mild areas of atelectasis and/or infiltrate are seen within the bilateral lung bases.  Hepatobiliary: No focal liver abnormality is seen. Numerous subcentimeter gallstones  are seen within a mildly distended gallbladder. Pancreas: There is a moderate to marked amount of mesenteric inflammatory fat stranding within the upper abdomen. This involves the peripancreatic region and extends superiorly along the perigastric region. Inferior extension is seen surrounding the descending colon. Spleen: Normal in size without focal abnormality. Adrenals/Urinary Tract: Adrenal glands are unremarkable. Kidneys are normal in size, without focal lesion or hydronephrosis. A 1.1 cm cluster of subcentimeter non-obstructing renal stones are seen within the mid right kidney. Bladder is unremarkable. Stomach/Bowel: There is a small hiatal hernia. Appendix appears normal. No evidence of bowel wall thickening or distention. Vascular/Lymphatic: There is marked severity aortic atherosclerosis. A stent is seen within the left common iliac vein. No enlarged abdominal or pelvic lymph nodes. Reproductive: The uterus is unremarkable. A 4.9 cm x 3.8 cm cystic appearing areas seen within the right adnexa. Other: A 3.9 cm x 2.9 cm fat containing periumbilical hernia is seen. Musculoskeletal: No acute or significant osseous findings. IMPRESSION: 1. Moderate to marked severity inflammatory process within the upper abdomen. This may be consistent acute pancreatitis, however, given the pattern of distribution of inflammatory fat stranding, associated inflammatory processes involving the stomach and/or descending colon cannot be excluded. 2. Cholelithiasis. 3. Right adnexal cyst, likely ovarian in origin. Electronically Signed   By: Virgina Norfolk M.D.   On: 01/12/2019 21:19   DG Chest Portable 1 View  Result Date: 01/12/2019 CLINICAL DATA:  Shortness of breath. Persistent vomiting. EXAM: PORTABLE CHEST 1 VIEW COMPARISON:  10/05/2015 FINDINGS: Heart size and pulmonary vascularity are within normal limits considering the AP portable  technique. Aortic atherosclerosis. AICD. New slight bibasilar atelectasis. No consolidative infiltrates or effusions. No acute bone abnormality. CABG. IMPRESSION: New slight bibasilar atelectasis. Aortic Atherosclerosis (ICD10-I70.0). Electronically Signed   By: Lorriane Shire M.D.   On: 01/12/2019 18:34    Procedures .Critical Care Performed by: Ezequiel Essex, MD Authorized by: Ezequiel Essex, MD   Critical care provider statement:    Critical care time (minutes):  45   Critical care was necessary to treat or prevent imminent or life-threatening deterioration of the following conditions:  Endocrine crisis and dehydration   Critical care was time spent personally by me on the following activities:  Discussions with consultants, evaluation of patient's response to treatment, examination of patient, ordering and performing treatments and interventions, ordering and review of laboratory studies, ordering and review of radiographic studies, pulse oximetry, re-evaluation of patient's condition, obtaining history from patient or surrogate and review of old charts   (including critical care time)  Medications Ordered in ED Medications  sodium chloride 0.9 % bolus 500 mL (has no administration in time range)  ondansetron (ZOFRAN) injection 4 mg (has no administration in time range)    ED Course  I have reviewed the triage vital signs and the nursing notes.  Pertinent labs & imaging results that were available during my care of the patient were reviewed by me and considered in my medical decision making (see chart for details).    MDM Rules/Calculators/A&P                      Medically complex patient here with nausea and vomiting since 3 AM.  Associated with hyperglycemia.  On exam she is tachypneic but not hypoxic.  Diffuse abdominal tenderness without guarding or rebound.  We will hydrate gently, assess for DKA with labs, chest x-ray  Labs concerning for DKA.  Lactate is 6, bicarb  is 19, anion gap is  16.  Transaminitis noted.  Patient started on IV insulin and IV fluids.  Also obtain abdominal imaging.  She will need admission.  Labs show transaminitis and lipase greater than 4000.  CT scan confirms pancreatitis without evidence of abscess.  Does have cholelithiasis without evidence of cholecystitis.  Judicious fluid resuscitation given patient's poor ejection fraction.  She is given IV fluids to treat both her hyperglycemia as well as her pancreatitis.  Insulin infusion for DKA. Rocephin for possible UTI. SUspect lactate elevation due to DKA and dehydration rather than sepsis.   She is tachypneic with stable on room air with stable blood pressure mental status. Admission to stepdown unit discussed with Dr. Hal Hope   Final Clinical Impression(s) / ED Diagnoses Final diagnoses:  Diabetic ketoacidosis without coma associated with type 2 diabetes mellitus ([place])  Transaminitis  Acute pancreatitis, unspecified complication status, unspecified pancreatitis type    Rx / DC Orders ED Discharge Orders    None       Ezequiel Essex, MD 01/12/19 2244

## 2019-01-12 NOTE — ED Notes (Signed)
CBG 392. Checked at 2300

## 2019-01-13 ENCOUNTER — Inpatient Hospital Stay (HOSPITAL_COMMUNITY): Payer: Medicare HMO

## 2019-01-13 DIAGNOSIS — E1310 Other specified diabetes mellitus with ketoacidosis without coma: Secondary | ICD-10-CM

## 2019-01-13 DIAGNOSIS — I361 Nonrheumatic tricuspid (valve) insufficiency: Secondary | ICD-10-CM

## 2019-01-13 DIAGNOSIS — K851 Biliary acute pancreatitis without necrosis or infection: Principal | ICD-10-CM

## 2019-01-13 DIAGNOSIS — I82402 Acute embolism and thrombosis of unspecified deep veins of left lower extremity: Secondary | ICD-10-CM

## 2019-01-13 LAB — LACTIC ACID, PLASMA: Lactic Acid, Venous: 2.9 mmol/L (ref 0.5–1.9)

## 2019-01-13 LAB — GLUCOSE, CAPILLARY
Glucose-Capillary: 359 mg/dL — ABNORMAL HIGH (ref 70–99)
Glucose-Capillary: 374 mg/dL — ABNORMAL HIGH (ref 70–99)
Glucose-Capillary: 337 mg/dL — ABNORMAL HIGH (ref 70–99)
Glucose-Capillary: 268 mg/dL — ABNORMAL HIGH (ref 70–99)
Glucose-Capillary: 254 mg/dL — ABNORMAL HIGH (ref 70–99)
Glucose-Capillary: 261 mg/dL — ABNORMAL HIGH (ref 70–99)

## 2019-01-13 LAB — BASIC METABOLIC PANEL
Anion gap: 12 (ref 5–15)
Anion gap: 14 (ref 5–15)
Anion gap: 13 (ref 5–15)
Anion gap: 14 (ref 5–15)
BUN: 30 mg/dL — ABNORMAL HIGH (ref 8–23)
BUN: 31 mg/dL — ABNORMAL HIGH (ref 8–23)
BUN: 31 mg/dL — ABNORMAL HIGH (ref 8–23)
BUN: 32 mg/dL — ABNORMAL HIGH (ref 8–23)
CO2: 19 mmol/L — ABNORMAL LOW (ref 22–32)
CO2: 17 mmol/L — ABNORMAL LOW (ref 22–32)
CO2: 18 mmol/L — ABNORMAL LOW (ref 22–32)
CO2: 18 mmol/L — ABNORMAL LOW (ref 22–32)
Calcium: 8.9 mg/dL (ref 8.9–10.3)
Calcium: 8.8 mg/dL — ABNORMAL LOW (ref 8.9–10.3)
Calcium: 8.8 mg/dL — ABNORMAL LOW (ref 8.9–10.3)
Calcium: 8.6 mg/dL — ABNORMAL LOW (ref 8.9–10.3)
Chloride: 107 mmol/L (ref 98–111)
Chloride: 106 mmol/L (ref 98–111)
Chloride: 107 mmol/L (ref 98–111)
Chloride: 106 mmol/L (ref 98–111)
Creatinine, Ser: 1.54 mg/dL — ABNORMAL HIGH (ref 0.44–1.00)
Creatinine, Ser: 1.54 mg/dL — ABNORMAL HIGH (ref 0.44–1.00)
Creatinine, Ser: 1.5 mg/dL — ABNORMAL HIGH (ref 0.44–1.00)
Creatinine, Ser: 1.68 mg/dL — ABNORMAL HIGH (ref 0.44–1.00)
GFR calc Af Amer: 40 mL/min — ABNORMAL LOW (ref >60)
GFR calc Af Amer: 40 mL/min — ABNORMAL LOW (ref >60)
GFR calc Af Amer: 41 mL/min — ABNORMAL LOW (ref >60)
GFR calc Af Amer: 36 mL/min — ABNORMAL LOW (ref >60)
GFR calc non Af Amer: 34 mL/min — ABNORMAL LOW (ref >60)
GFR calc non Af Amer: 34 mL/min — ABNORMAL LOW (ref >60)
GFR calc non Af Amer: 35 mL/min — ABNORMAL LOW (ref >60)
GFR calc non Af Amer: 31 mL/min — ABNORMAL LOW (ref >60)
Glucose, Bld: 262 mg/dL — ABNORMAL HIGH (ref 70–99)
Glucose, Bld: 294 mg/dL — ABNORMAL HIGH (ref 70–99)
Glucose, Bld: 331 mg/dL — ABNORMAL HIGH (ref 70–99)
Glucose, Bld: 340 mg/dL — ABNORMAL HIGH (ref 70–99)
Potassium: 4.5 mmol/L (ref 3.5–5.1)
Potassium: 5.2 mmol/L — ABNORMAL HIGH (ref 3.5–5.1)
Potassium: 5.4 mmol/L — ABNORMAL HIGH (ref 3.5–5.1)
Potassium: 5 mmol/L (ref 3.5–5.1)
Sodium: 138 mmol/L (ref 135–145)
Sodium: 137 mmol/L (ref 135–145)
Sodium: 138 mmol/L (ref 135–145)
Sodium: 138 mmol/L (ref 135–145)

## 2019-01-13 LAB — ECHOCARDIOGRAM COMPLETE
Height: 63 in
Weight: 3664.01 oz

## 2019-01-13 LAB — BETA-HYDROXYBUTYRIC ACID
Beta-Hydroxybutyric Acid: 0.07 mmol/L (ref 0.05–0.27)
Beta-Hydroxybutyric Acid: 0.93 mmol/L — ABNORMAL HIGH (ref 0.05–0.27)
Beta-Hydroxybutyric Acid: 1.44 mmol/L — ABNORMAL HIGH (ref 0.05–0.27)
Beta-Hydroxybutyric Acid: 1.67 mmol/L — ABNORMAL HIGH (ref 0.05–0.27)

## 2019-01-13 LAB — TROPONIN I (HIGH SENSITIVITY)
Troponin I (High Sensitivity): 688 ng/L (ref <18)
Troponin I (High Sensitivity): 843 ng/L (ref <18)
Troponin I (High Sensitivity): 1023 ng/L (ref <18)
Troponin I (High Sensitivity): 1023 ng/L (ref <18)

## 2019-01-13 LAB — CBC
HCT: 46.9 % — ABNORMAL HIGH (ref 36.0–46.0)
Hemoglobin: 15.2 g/dL — ABNORMAL HIGH (ref 12.0–15.0)
MCH: 32.3 pg (ref 26.0–34.0)
MCHC: 32.4 g/dL (ref 30.0–36.0)
MCV: 99.8 fL (ref 80.0–100.0)
Platelets: 162 10*3/uL (ref 150–400)
RBC: 4.7 MIL/uL (ref 3.87–5.11)
RDW: 13.5 % (ref 11.5–15.5)
WBC: 13 10*3/uL — ABNORMAL HIGH (ref 4.0–10.5)
nRBC: 0 % (ref 0.0–0.2)

## 2019-01-13 LAB — HEPATIC FUNCTION PANEL
ALT: 439 U/L — ABNORMAL HIGH (ref 0–44)
AST: 358 U/L — ABNORMAL HIGH (ref 15–41)
Albumin: 3.1 g/dL — ABNORMAL LOW (ref 3.5–5.0)
Alkaline Phosphatase: 156 U/L — ABNORMAL HIGH (ref 38–126)
Bilirubin, Direct: 1.9 mg/dL — ABNORMAL HIGH (ref 0.0–0.2)
Indirect Bilirubin: 1.1 mg/dL — ABNORMAL HIGH (ref 0.3–0.9)
Total Bilirubin: 3 mg/dL — ABNORMAL HIGH (ref 0.3–1.2)
Total Protein: 7.3 g/dL (ref 6.5–8.1)

## 2019-01-13 LAB — CBG MONITORING, ED
Glucose-Capillary: 233 mg/dL — ABNORMAL HIGH (ref 70–99)
Glucose-Capillary: 241 mg/dL — ABNORMAL HIGH (ref 70–99)
Glucose-Capillary: 300 mg/dL — ABNORMAL HIGH (ref 70–99)
Glucose-Capillary: 282 mg/dL — ABNORMAL HIGH (ref 70–99)
Glucose-Capillary: 321 mg/dL — ABNORMAL HIGH (ref 70–99)

## 2019-01-13 LAB — SARS CORONAVIRUS 2 (TAT 6-24 HRS): SARS Coronavirus 2: NEGATIVE

## 2019-01-13 LAB — PROTIME-INR
INR: 1.7 — ABNORMAL HIGH (ref 0.8–1.2)
Prothrombin Time: 19.7 seconds — ABNORMAL HIGH (ref 11.4–15.2)

## 2019-01-13 LAB — APTT
aPTT: 165 seconds (ref 24–36)
aPTT: 131 seconds — ABNORMAL HIGH (ref 24–36)
aPTT: 100 seconds — ABNORMAL HIGH (ref 24–36)

## 2019-01-13 LAB — HIV ANTIBODY (ROUTINE TESTING W REFLEX): HIV Screen 4th Generation wRfx: NONREACTIVE

## 2019-01-13 LAB — MRSA PCR SCREENING: MRSA by PCR: NEGATIVE

## 2019-01-13 LAB — HEPARIN LEVEL (UNFRACTIONATED): Heparin Unfractionated: 1.09 IU/mL — ABNORMAL HIGH (ref 0.30–0.70)

## 2019-01-13 MED ORDER — HEPARIN BOLUS VIA INFUSION
4000.0000 [IU] | Freq: Once | INTRAVENOUS | Status: AC
  Administered 2019-01-13: 4000 [IU] via INTRAVENOUS
  Filled 2019-01-13: qty 4000

## 2019-01-13 MED ORDER — ORAL CARE MOUTH RINSE
15.0000 mL | Freq: Two times a day (BID) | OROMUCOSAL | Status: DC
  Administered 2019-01-13 – 2019-01-21 (×15): 15 mL via OROMUCOSAL

## 2019-01-13 MED ORDER — ONDANSETRON HCL 4 MG/2ML IJ SOLN: 4.0000 mg | Freq: Four times a day (QID) | INTRAMUSCULAR | Status: DC | PRN

## 2019-01-13 MED ORDER — METOPROLOL TARTRATE 5 MG/5ML IV SOLN
5.0000 mg | Freq: Three times a day (TID) | INTRAVENOUS | Status: DC
  Administered 2019-01-13 – 2019-01-15 (×6): 5 mg via INTRAVENOUS
  Filled 2019-01-13 (×6): qty 5

## 2019-01-13 MED ORDER — CHLORHEXIDINE GLUCONATE CLOTH 2 % EX PADS
6.0000 | MEDICATED_PAD | Freq: Every day | CUTANEOUS | Status: DC
  Administered 2019-01-13 – 2019-01-20 (×8): 6 via TOPICAL

## 2019-01-13 MED ORDER — HEPARIN (PORCINE) 25000 UT/250ML-% IV SOLN
1100.0000 [IU]/h | INTRAVENOUS | Status: DC
  Administered 2019-01-13: 1100 [IU]/h via INTRAVENOUS
  Filled 2019-01-13: qty 250

## 2019-01-13 MED ORDER — PERFLUTREN LIPID MICROSPHERE
1.0000 mL | INTRAVENOUS | Status: AC | PRN
  Administered 2019-01-13: 2 mL via INTRAVENOUS
  Filled 2019-01-13: qty 10

## 2019-01-13 MED ORDER — HEPARIN (PORCINE) 25000 UT/250ML-% IV SOLN
1050.0000 [IU]/h | INTRAVENOUS | Status: DC
  Administered 2019-01-14 – 2019-01-15 (×2): 1050 [IU]/h via INTRAVENOUS
  Filled 2019-01-13 (×2): qty 250

## 2019-01-13 MED ORDER — MORPHINE SULFATE (PF) 2 MG/ML IV SOLN
2.0000 mg | INTRAVENOUS | Status: DC | PRN
  Administered 2019-01-13 – 2019-01-19 (×22): 2 mg via INTRAVENOUS
  Filled 2019-01-13 (×22): qty 1

## 2019-01-13 NOTE — ED Notes (Signed)
Date and time results received: 01/13/19 9:55 AM  (use smartphrase ".now" to insert current time)  Test: Trop Critical Value: 843  Name of Provider Notified: Adhikari  Orders Received? Or Actions Taken?: Orders Received - See Orders for details

## 2019-01-13 NOTE — ED Notes (Signed)
Patient reports she wears oxygen at home. In the notes, patient wears oxygen at home. However, her oxygen saturation is 92-93%, mild labored breathing, and pt agrees she is mildly short of breath. Applied oxygen 2L via [place]. Oxygen saturation has increased to 95-96%. NP with [facility] is aware since she was at bedside.

## 2019-01-13 NOTE — Progress Notes (Signed)
Echocardiogram 2D Echocardiogram has been performed.  [place] Deputy Danahi Reddish 01/13/2019, 1:45 PM

## 2019-01-13 NOTE — Progress Notes (Signed)
[place] for IV heparin Indication: Hx DVT  Allergies  Allergen Reactions  . Lipitor  [Atorvastatin Calcium] Rash  . Metformin And Related Itching, Swelling and Other (See Comments)    Leg pain & swelling in legs  . Atorvastatin Itching and Rash  . Levofloxacin Itching    Patient Measurements: Height: 5\' 3"  (160 cm) Weight: 229 lb (103.9 kg) IBW/kg (Calculated) : 52.4 Heparin Dosing Weight: 77 kg  Vital Signs: Temp Source: Oral (01/05 1244) BP: 154/85 (01/05 1244) Pulse Rate: 118 (01/05 1244)  Labs: Recent Labs    01/12/19 1857 01/13/19 0639 01/13/19 0651 01/13/19 0849 01/13/19 1044 01/13/19 1356  HGB 14.6  --   --  15.2*  --   --   HCT 45.2  --   --  46.9*  --   --   PLT 164  --   --  162  --   --   APTT  --   --  165* 131*  --  100*  LABPROT  --   --  19.7*  --   --   --   INR  --   --  1.7*  --   --   --   HEPARINUNFRC  --  1.09*  --   --   --   --   CREATININE 1.70*  --  1.54* 1.50* 1.68*  --   TROPONINIHS 86*  --  688* 843* 1,023* 1,023*    Estimated Creatinine Clearance: 36.9 mL/min (A) (by C-G formula based on SCr of 1.68 mg/dL (H)).   Medical History: Past Medical History:  Diagnosis Date  . AICD (automatic cardioverter/defibrillator) present   . Angina decubitus ([place]) 11/06/2012  . Angina pectoris ([place])   . Anxiety   . Arthritis   . Back pain   . Bundle branch block 05/2016  . CHF (congestive heart failure) ([place])   . Chronic combined systolic and diastolic CHF, NYHA class 3 ([place])   . Coronary artery disease    s/p CABG 2007  . Depression   . Dermal mycosis   . Diabetes mellitus    type II  . DVT (deep venous thrombosis) ([place]) 09/2015   bilateral  . Edema, lower extremity   . Glaucoma   . Hyperlipidemia   . Hypertension   . Insomnia   . Ischemic cardiomyopathy    EF 30-35%, s/p ICD 4/08 by Dr Leonia Reeves  . Joint pain   . Morbid obesity ([place]) 11/06/2012  . Obesity   . Oxygen dependent 2 L  . Pain  in left knee   . Peripheral neuropathy   . 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 Sleep apnea    uses O2 at night  . SOB (shortness of breath)   . Tinea   . Ventricular tachycardia ([place]) 01/16/15   sustained VT terminated with ATP, CL 340 msec    Medications:  PTA Xarelto 20mg  daily - Last dose 01/11/19 @ 22:00  Scheduled:  . latanoprost  1 drop Both Eyes QHS  . metoprolol tartrate  5 mg Intravenous Q8H   Infusions:  . sodium chloride 75 mL/hr at 01/13/19 0912  . dextrose 5 % and 0.45% NaCl Stopped (01/13/19 0912)  . heparin    . insulin 1.3 Units/hr (01/13/19 1400)    Assessment: 56 yoF with extensive cardiac history including DVT  on Xarelto, CAD s/p CABG 2007, HFrEF with AICD, HTN, HLD, DM, presents with n/v d/t pancreatitis. Pharmacy consulted to dose IV heparin while Xarelto on hold   Prior anticoagulation: Xarelto 20 mg daily, last dose 01/11/19  Significant events:  Today, 01/13/2019:  CBC: WNL  HS troponin elevated at 3x ULN  SCr slightly above baseline  No bleeding or infusion issues noted  13:56 aPTT = 100 sec (at upper end of therapeutic range)  Goal of Therapy: Heparin level 0.3-0.7 units/ml APTT level 66-102 sec Monitor platelets by anticoagulation protocol: Yes  Plan:  Reduce Heparin slightly to 1050 units/hr IV infusion  Recheck heparin level 8 hrs after rate reduction  Daily CBC and heparin level; aPTT as needed while heparin level elevated from recent DOAC  Monitor for signs of bleeding or thrombosis  Leone Haven, PharmD 01/13/2019, 4:20 PM

## 2019-01-13 NOTE — Consult Note (Signed)
Cardiology Consultation:   Patient ID: [name] MRN: [id number]; DOB: September 15, 1950  Admit date: 01/12/2019 Date of Consult: 01/13/2019  Primary Care Provider: Everardo Beals, NP Primary Cardiologist: Candee Furbish, MD  Primary Electrophysiologist:  Thompson Grayer, MD    Patient Profile:   [name] is a 69 y.o. female with a hx of CAD s/p CABG [id number], chronic systolic CHF/ischemic cardiomyopathy, history of ventricular tachycardia s/p ICD, diabetes mellitus, hypertension, history of DVT 09/2015 now on anticoagulation, OSA who is being seen today for the evaluation of elevated troponin at the request of Dr. Tawanna Solo.  History of Present Illness:   [name] has an extensive cardiac history as above.  She is followed in our office by Dr. Marlou Porch and was last seen on 08/19/2018 by [name], PA.  Cath in 2011 showed patent grafts.  She has ischemic cardiomyopathy with chronic combined systolic and diastolic heart failure.  Her last echocardiogram in 2017 showed EF 40-45% with grade 1 diastolic dysfunction.  She had a Myoview in 2016 that showed large defect of severe severity but reversible defect.  She is on guideline directed medical therapy for heart failure and CAD.  Today the patient tells me that until 2 days ago she feels like she was at her baseline.  She was compliant with all of her cardiac medications.  She has chronic two-pillow orthopnea which has not been worse.  She denies any lower extremity edema.  She has had no chest pain/pressure or discomfort.  She has her baseline level of mild chronic DOE with no recent worsening.  She has had no palpitations or lightheadedness.  2 days ago she developed abdominal pain, nausea and vomiting.  She continues to have had no chest pain or discomfort or difficulty breathing.  Heart Pathway Score:     Past Medical History:  Diagnosis Date  . AICD (automatic cardioverter/defibrillator) present   . Angina decubitus ([place]) 11/06/2012  .  Angina pectoris ([place])   . Anxiety   . Arthritis   . Back pain   . Bundle branch block 05/2016  . CHF (congestive heart failure) ([place])   . Chronic combined systolic and diastolic CHF, NYHA class 3 ([place])   . Coronary artery disease    s/p CABG 2007  . Depression   . Dermal mycosis   . Diabetes mellitus    type II  . DVT (deep venous thrombosis) ([place]) 09/2015   bilateral  . Edema, lower extremity   . Glaucoma   . Hyperlipidemia   . Hypertension   . Insomnia   . Ischemic cardiomyopathy    EF 30-35%, s/p ICD 4/08 by Dr Leonia Reeves  . Joint pain   . Morbid obesity ([place]) 11/06/2012  . Obesity   . Oxygen dependent 2 L  . Pain in left knee   . Peripheral neuropathy   . 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 Sleep apnea    uses O2 at night  . SOB (shortness of breath)   . Tinea   . Ventricular tachycardia ([place]) 01/16/15   sustained VT terminated with ATP, CL 340 msec    Past Surgical History:  Procedure Laterality Date  . BI-VENTRICULAR IMPLANTABLE CARDIOVERTER DEFIBRILLATOR UPGRADE N/A 09/25/2012   Procedure: BI-VENTRICULAR IMPLANTABLE CARDIOVERTER DEFIBRILLATOR UPGRADE;  Surgeon: Coralyn Mark, MD;  Location: [facility] CATH LAB;  Service: Cardiovascular;  Laterality: N/A;  .  BREAST BIOPSY Right 04/05/2014  . CARDIAC CATHETERIZATION    . CARDIAC DEFIBRILLATOR PLACEMENT  4/08   by Dr Leonia Reeves (MDT)  . COLONOSCOPY WITH PROPOFOL N/A 10/12/2016   Procedure: COLONOSCOPY WITH PROPOFOL;  Surgeon: Doran Stabler, MD;  Location: [facility] ENDOSCOPY;  Service: Gastroenterology;  Laterality: N/A;  . CORONARY ARTERY BYPASS GRAFT  10/05/2005   by Dr Cyndia Bent  . IMPLANTABLE CARDIOVERTER DEFIBRILLATOR IMPLANT  09/25/12   attempt of upgrade to CRT-D unsuccessful due to CS anatomy, SJM Unify [place] device placed with LV port capped by Dr Rayann Heman  . IR GENERIC HISTORICAL  10/03/2015   IR US GUIDE VASC ACCESS LEFT 10/03/2015  Jacqulynn Cadet, MD [facility]  . IR GENERIC HISTORICAL  10/03/2015   IR VENO/EXT/UNI LEFT 10/03/2015 Jacqulynn Cadet, MD [facility]  . IR GENERIC HISTORICAL  10/03/2015   IR VENOCAVAGRAM IVC 10/03/2015 Jacqulynn Cadet, MD [facility]  . IR GENERIC HISTORICAL  10/03/2015   IR INFUSION THROMBOL VENOUS INITIAL (MS) 10/03/2015 Jacqulynn Cadet, MD [facility]  . IR GENERIC HISTORICAL  10/03/2015   IR US GUIDE VASC ACCESS LEFT 10/03/2015 Jacqulynn Cadet, MD [facility]  . IR GENERIC HISTORICAL  10/04/2015   IR THROMBECT VENO MECH MOD SED 10/04/2015 Sandi Mariscal, MD [facility]  . IR GENERIC HISTORICAL  10/04/2015   IR TRANSCATH PLC STENT 1ST ART NOT LE CV CAR VERT CAR 10/04/2015 Sandi Mariscal, MD [facility]  . IR GENERIC HISTORICAL  10/04/2015   IR THROMB F/U EVAL ART/VEN FINAL DAY (MS) 10/04/2015 Sandi Mariscal, MD [facility]  . IR GENERIC HISTORICAL  11/02/2015   IR RADIOLOGIST EVAL & MGMT 11/02/2015 Sandi Mariscal, MD [facility]  . IR RADIOLOGIST EVAL & MGMT  04/26/2016     Home Medications:  Prior to Admission medications   Medication Sig Start Date End Date Taking? Authorizing Provider  albuterol (PROVENTIL HFA;VENTOLIN HFA) 108 (90 BASE) MCG/ACT inhaler Inhale 2 puffs into the lungs every 6 (six) hours as needed for wheezing or shortness of breath.   Yes [provider]  albuterol (PROVENTIL) (2.5 MG/3ML) 0.083% nebulizer solution Take 2.5 mg by nebulization every 6 (six) hours as needed for wheezing or shortness of breath.  09/30/15  Yes [provider]  amitriptyline (ELAVIL) 10 MG tablet Take 10 mg by mouth daily. 07/25/16  Yes [provider]  Capsaicin (MUSCLE RELIEF EX) Apply 1 application topically 3 (three) times daily as needed. For pain 08/09/16  Yes [provider]  carvedilol (COREG) 12.5 MG tablet Take 12.5 mg by mouth 2 (two) times daily with a meal.  03/30/15  Yes [provider]  ferrous sulfate 300 (60 Fe) MG/5ML syrup Take 300  mg by mouth daily with lunch.   Yes [provider]  furosemide (LASIX) 80 MG tablet TAKE 1 TABLET TWICE DAILY Patient taking differently: Take 80 mg by mouth 2 (two) times daily.  12/31/18  Yes Weaver, Scott T, PA-C  glipiZIDE (GLUCOTROL) 5 MG tablet Take 5 mg by mouth daily before breakfast.    Yes [provider]  isosorbide mononitrate (IMDUR) 30 MG 24 hr tablet TAKE 1 TABLET (30 MG TOTAL) BY MOUTH DAILY. 12/31/18  Yes Weaver, Scott T, PA-C  ketoconazole (NIZORAL) 2 % cream Apply 1 application topically 2 (two) times daily as needed (for skin irritation).  03/30/15  Yes [provider]  latanoprost (XALATAN) 0.005 % ophthalmic solution Place 1 drop into both eyes at bedtime.   Yes [provider]  LEVEMIR FLEXTOUCH 100 UNIT/ML Pen Inject 15-20 Units into the skin at bedtime. Patient states she takes every other day in the pm.  Patient takes 15-20 units if glucose is greater than 140 per patient 10/19/15  Yes [provider]  lisinopril (PRINIVIL,ZESTRIL) 10 MG tablet Take 10 mg by mouth daily.    Yes [provider]  methocarbamol (ROBAXIN) 500 MG tablet Take 1 tablet (500 mg total) by mouth every 8 (eight) hours as needed for muscle spasms. 05/30/14  Yes Barton Dubois, MD  nitroGLYCERIN (NITROSTAT) 0.4 MG SL tablet Place 0.4 mg under the tongue every 5 (five) minutes as needed for chest pain.   Yes [provider]  NOVOLOG FLEXPEN 100 UNIT/ML FlexPen Inject 30-50 Units into the skin See admin instructions. 50 units every morning, 25 units at lunch and 50 units at supper *Hold for low blood sugar* 09/12/15  Yes [provider]  oxybutynin (DITROPAN-XL) 10 MG 24 hr tablet Take 10 mg by mouth daily. 01/12/18  Yes [provider]  Potassium Chloride ER 20 MEQ TBCR Take 20 mEq by mouth daily. 07/04/16  Yes [provider]  rosuvastatin (CRESTOR) 20 MG tablet Take 1 tablet (20 mg total) by mouth daily at 6 PM. 10/07/15   Yes Minus Liberty, MD  Semaglutide (OZEMPIC, 0.25 OR 0.5 MG/DOSE, [place]) Inject 0.5 mg into the skin once a week.    Yes [provider]  spironolactone (ALDACTONE) 25 MG tablet Take 25 mg by mouth daily.   Yes [provider]  tolterodine (DETROL LA) 4 MG 24 hr capsule Take 4 mg by mouth daily. 01/12/18  Yes [provider]  traMADol (ULTRAM) 50 MG tablet Take 50 mg by mouth 2 (two) times daily. 11/28/15  Yes [provider]  Vitamin D, Ergocalciferol, (DRISDOL) 1.25 MG (50000 UT) CAPS capsule Take 1 capsule (50,000 Units total) by mouth every 7 (seven) days. 12/30/18  Yes Jearld Lesch A, DO  XARELTO 20 MG TABS tablet Take 20 mg by mouth daily.  12/14/15  Yes [provider]    Inpatient Medications: Scheduled Meds: . latanoprost  1 drop Both Eyes QHS   Continuous Infusions: . sodium chloride 75 mL/hr at 01/13/19 0912  . cefTRIAXone (ROCEPHIN)  IV    . dextrose 5 % and 0.45% NaCl Stopped (01/13/19 0912)  . heparin 1,100 Units/hr (01/13/19 0539)  . insulin Stopped (01/13/19 0409)  . metronidazole 500 mg (01/13/19 0911)   PRN Meds: albuterol, dextrose, morphine injection, ondansetron (ZOFRAN) IV  Allergies:    Allergies  Allergen Reactions  . Lipitor  [Atorvastatin Calcium] Rash  . Metformin And Related Itching, Swelling and Other (See Comments)    Leg pain & swelling in legs  . Atorvastatin Itching and Rash  . Levofloxacin Itching    Social History:   Social History   Socioeconomic History  . Marital status: Legally Separated    Spouse name: Not on file  . Number of children: 3  . Years of education: 33  . Highest education level: Not on file  Occupational History  . Occupation: retired    Fish farm manager: UNEMPLOYED  Tobacco Use  . Smoking status: Never Smoker  . Smokeless tobacco: Never Used  Substance and Sexual Activity  . Alcohol use: No  . Drug use: No  . Sexual activity: Not Currently  Other Topics Concern  . Not on  file  Social History Narrative   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  11/04/15 Lives with son    caffeine - coffee, 1-2 cups daily   Social Determinants of Health   Financial Resource Strain:   . Difficulty of Paying Living Expenses: Not on file  Food Insecurity:   . Worried About Charity fundraiser in the Last Year: Not on file  . Ran Out of Food in the Last Year: Not on file  Transportation Needs:   . Lack of Transportation (Medical): Not on file  . Lack of Transportation (Non-Medical): Not on file  Physical Activity:   . Days of Exercise per Week: Not on file  . Minutes of Exercise per Session: Not on file  Stress:   . Feeling of Stress : Not on file  Social Connections:   . Frequency of Communication with Friends and Family: Not on file  . Frequency of Social Gatherings with Friends and Family: Not on file  . Attends Religious Services: Not on file  . Active Member of Clubs or Organizations: Not on file  . Attends Archivist Meetings: Not on file  . Marital Status: Not on file  Intimate Partner Violence:   . Fear of Current or Ex-Partner: Not on file  . Emotionally Abused: Not on file  . Physically Abused: Not on file  . Sexually Abused: Not on file    Family History:    Family History  Problem Relation Age of Onset  . Diabetes Mother 44       died - HTN  . Stroke Mother   . High blood pressure Mother   . Sudden death Mother   . Obesity Mother   . Other Other        No early family hx of CAD     ROS:  Please see the history of present illness.   All other ROS reviewed and negative.     Physical Exam/Data:   Vitals:   01/13/19 0630 01/13/19 0700 01/13/19 0800 01/13/19 0830  BP: 136/80 (!) 141/86 (!) 136/46 (!) 150/91  Pulse: (!) 109 (!) 110 (!) 113 (!) 113  Resp: (!) 37 (!) 34 (!) 37 (!) 37  Temp:      TempSrc:      SpO2: (!) 89% 90% (!) 89% 92%  Weight:      Height:        Intake/Output Summary (Last 24  hours) at 01/13/2019 0942 Last data filed at 01/13/2019 0409 Gross per 24 hour  Intake 2700 ml  Output --  Net 2700 ml   Last 3 Weights 01/12/2019 12/30/2018 12/09/2018  Weight (lbs) 229 lb 229 lb 234 lb  Weight (kg) 103.874 kg 103.874 kg 106.142 kg     Body mass index is 40.57 kg/m.  General: Obese female, in no acute distress HEENT: normal Lymph: no adenopathy Neck: no JVD Endocrine:  No thryomegaly Vascular: No carotid bruits; FA pulses 2+ bilaterally without bruits  Cardiac:  normal S1, S2; regular rhythm; no murmur, mildly tachycardic Lungs:  clear to auscultation bilaterally, no wheezing, rhonchi or rales  Abd: soft, nontender, no hepatomegaly  Ext: no edema Musculoskeletal:  No deformities, BUE and BLE strength normal and equal Skin: warm and dry  Neuro:  CNs 2-12 intact, no focal abnormalities noted Psych:  Normal affect   EKG:  The EKG was personally reviewed and demonstrates: NSR with LBBB, 99 bpm, no significant changes from prior EKGs Telemetry:  Telemetry was personally reviewed and demonstrates: Sinus rhythm 100s-110s  Relevant CV Studies:  Echocardiogram 03/13/2015 Study Conclusions -  Left ventricle: The cavity size was normal. Wall thickness was   increased increased in a pattern of mild to moderate LVH.   Systolic function was mildly to moderately reduced. The estimated   ejection fraction was in the range of 40% to 45%. Wall motion was   normal; there were no regional wall motion abnormalities. Doppler   parameters are consistent with abnormal left ventricular   relaxation (grade 1 diastolic dysfunction). - Aortic valve: Valve area (VTI): 2.32 cm^2. Valve area (Vmax):   2.39 cm^2. - Technically difficult study. Echocontrast was used to enhance   visualization.  Myoview 5/16 Findings consistent with prior myocardial infarction. This is a high risk study. The left ventricular ejection fraction is severely decreased (<30%). Defect 1: There is a large defect of  severe severity present in the mid inferoseptal and apex location.  [place] 12/11 EF 30-35% L-D2 patent S-RI patent S-OM1/OM2 patent S-PDA patent  Laboratory Data:  High Sensitivity Troponin:   Recent Labs  Lab 01/12/19 1857 01/13/19 0651  TROPONINIHS 86* 688*     Chemistry Recent Labs  Lab 01/13/19 0341 01/13/19 0651 01/13/19 0849  NA 138 137 138  K 4.5 5.2* 5.4*  CL 107 106 107  CO2 19* 17* 18*  GLUCOSE 262* 294* 331*  BUN 30* 31* 31*  CREATININE 1.54* 1.54* 1.50*  CALCIUM 8.9 8.8* 8.8*  GFRNONAA 34* 34* 35*  GFRAA 40* 40* 41*  ANIONGAP 12 14 13     Recent Labs  Lab 01/12/19 1857 01/13/19 0651  PROT 7.8 7.3  ALBUMIN 3.5 3.1*  AST 819* 358*  ALT 589* 439*  ALKPHOS 170* 156*  BILITOT 2.1* 3.0*   Hematology Recent Labs  Lab 01/12/19 1857 01/13/19 0849  WBC 13.4* 13.0*  RBC 4.46 4.70  HGB 14.6 15.2*  HCT 45.2 46.9*  MCV 101.3* 99.8  MCH 32.7 32.3  MCHC 32.3 32.4  RDW 13.5 13.5  PLT 164 162   BNP Recent Labs  Lab 01/12/19 1857  BNP 88.3    DDimer No results for input(s): DDIMER in the last 168 hours.   Radiology/Studies:  CT ABDOMEN PELVIS WO CONTRAST  Result Date: 01/12/2019 CLINICAL DATA:  Nausea and vomiting. EXAM: CT ABDOMEN AND PELVIS WITHOUT CONTRAST TECHNIQUE: Multidetector CT imaging of the abdomen and pelvis was performed following the standard protocol without IV contrast. COMPARISON:  May 26, 2014 FINDINGS: Lower chest: Multiple sternal wires are noted. Mild areas of atelectasis and/or infiltrate are seen within the bilateral lung bases. Hepatobiliary: No focal liver abnormality is seen. Numerous subcentimeter gallstones are seen within a mildly distended gallbladder. Pancreas: There is a moderate to marked amount of mesenteric inflammatory fat stranding within the upper abdomen. This involves the peripancreatic region and extends superiorly along the perigastric region. Inferior extension is seen surrounding the descending colon. Spleen:  Normal in size without focal abnormality. Adrenals/Urinary Tract: Adrenal glands are unremarkable. Kidneys are normal in size, without focal lesion or hydronephrosis. A 1.1 cm cluster of subcentimeter non-obstructing renal stones are seen within the mid right kidney. Bladder is unremarkable. Stomach/Bowel: There is a small hiatal hernia. Appendix appears normal. No evidence of bowel wall thickening or distention. Vascular/Lymphatic: There is marked severity aortic atherosclerosis. A stent is seen within the left common iliac vein. No enlarged abdominal or pelvic lymph nodes. Reproductive: The uterus is unremarkable. A 4.9 cm x 3.8 cm cystic appearing areas seen within the right adnexa. Other: A 3.9 cm x 2.9 cm fat containing periumbilical hernia is seen. Musculoskeletal: No  acute or significant osseous findings. IMPRESSION: 1. Moderate to marked severity inflammatory process within the upper abdomen. This may be consistent acute pancreatitis, however, given the pattern of distribution of inflammatory fat stranding, associated inflammatory processes involving the stomach and/or descending colon cannot be excluded. 2. Cholelithiasis. 3. Right adnexal cyst, likely ovarian in origin. Electronically Signed   By: Virgina Norfolk M.D.   On: 01/12/2019 21:19   DG Chest Portable 1 View  Result Date: 01/12/2019 CLINICAL DATA:  Shortness of breath. Persistent vomiting. EXAM: PORTABLE CHEST 1 VIEW COMPARISON:  10/05/2015 FINDINGS: Heart size and pulmonary vascularity are within normal limits considering the AP portable technique. Aortic atherosclerosis. AICD. New slight bibasilar atelectasis. No consolidative infiltrates or effusions. No acute bone abnormality. CABG. IMPRESSION: New slight bibasilar atelectasis. Aortic Atherosclerosis (ICD10-I70.0). Electronically Signed   By: Lorriane Shire M.D.   On: 01/12/2019 18:34       TIMI Risk Score for Unstable Angina or Non-ST Elevation MI:   The patient's TIMI risk score is   , which indicates a  % risk of all cause mortality, new or recurrent myocardial infarction or need for urgent revascularization in the next 14 days.   Assessment and Plan:   Elevated troponin -Patient presented with abdominal pain and was found to have elevated LFTs and lipase, suspecting pancreatitis.  Also has significantly elevated blood glucose. -Troponin was initially 86 and rose to 688. Will check another troponin for trending.  -Patient with known left bundle branch block on EKG, no acute changes noted currently. -She denies any chest discomfort or dyspnea. -Troponin elevation may be related to demand ischemia in the setting of acute abdominal illness. -We will check echocardiogram for any changes in LV function and wall motion  Chronic systolic heart failure/ischemic cardiomyopathy -EF 40-45% by last echo in 03/2015. -Home management includes carvedilol 12.5 mg BID, furosemide 80 mg BID, isosorbide 30 mg, lisinopril 10 mg and spironolactone 25 mg. All meds currently on hold due to pancreatitis. -BNP is in normal range at 88.3. -We will recheck echocardiogram for LV function. -Pt received 2L IV fluids for acute pancreatitis and possible DKA as well as AKI. HF meds are on hold with NPO status. Will need to watch fluid status closely.  -Currently patient appears euvolemic. -Currently HR is in the 100's-110's. She will need resumption of her BB as soon as able. Will consider IV options.  -Resume heart failure meds as soon as pt tolerates.   Acute on CKD stage III -Serum creatinine was 1.22 in January, 1.79 in October.  Creatinine on presentation was 1.70 and has trended down to 1.50 this morning. -Pt with lactic acidosis likely related to dehydration.  Patient has received 2 L of IV fluids. -Continue to monitor renal function.  CAD s/p CABG 2007 -Myoview in 2016 demonstrated large scar. -Not on aspirin due to need for anticoagulation. -Continues on beta-blocker, isosorbide,  statin.  Diabetes Mellitus with possible DKA -A1c 8.2 in 10/2018. -Presented with elevated blood glucose.  She was started on insulin drip per primary team, which has now been discontinued.   Acute pancreatitis -per primary team. CT of the abdomen pelvis shows features concerning for acute pancreatitis the differentials include stomach or colon pathologies. Elevated lipase and LFTs.   Essential hypertension -Home meds currently on hold due to acute pancreatitis.  -BP is mild-moderately elevated 130's-150's.  History of DVT -On long-term anticoagulation, now on hold. IV heparin infusing.   Presence of ICD -Followed by Dr. Rayann Heman  Hyperlipidemia -Managed  on rosuvastatin 20 mg.  -Lipid panel in 10/2018 showed LDL 64. At goal.      For questions or updates, please contact [place] Please consult [email or web address] for contact info under     Signed, Daune Perch, NP  01/13/2019 9:42 AM

## 2019-01-13 NOTE — ED Notes (Signed)
Date and time results received: 01/13/19 11:48 AM  (use smartphrase ".now" to insert current time)  Test: Troponin Critical Value: 1023  Name of Provider Notified: Adhikari  Orders Received? Or Actions Taken?: Orders Received - See Orders for details

## 2019-01-13 NOTE — Progress Notes (Addendum)
PROGRESS NOTE    [name]  [id number] DOB: Oct 18, 1950 DOA: 01/12/2019 PCP: Everardo Beals, NP   Brief Narrative:  Patient is a 69 year old female with history of coronary disease status post CABG, chronic systolic heart failure status post AICD, history of V. tach, diabetes type 2, chronic kidney disease stage III, history of DVT on anticoagulation who presents to the emergency department with complaints of worsening abdominal pain.  She was complaining  of generalized abdominal pain mostly in the epigastric area with nausea and vomiting since last 3 weeks.  She was not able to tolerate food or medications by mouth.  Pain worsened over 24 to 48 hours.  On presentation she was afebrile.  She was found to have elevated liver enzymes, elevated lipase.  Elevated troponin.  EKG showed nonspecific ST changes.  Denies any chest pain.  CT abdomen/pelvis was concerning for acute pancreatitis.  Had elevated anion gap, low CO2 concerning for DKA.  Patient was admitted for the management of pancreatitis, possible NSTEMI, DKA, UTI.  Cardiology and GI following.  Currently on heparin drip.  Assessment & Plan:   Principal Problem:   Acute pancreatitis Active Problems:   S/P CABG x 5   Chronic combined systolic and diastolic heart failure (HCC)   AKI (acute kidney injury) ([place])   DVT (deep venous thrombosis) (HCC)   DKA (diabetic ketoacidoses) (HCC)   Acute pancreatitis: Most likely gallstone pancreatitis.  Presented with epigastric abdominal pain.  Pain is better this morning.  Currently on clear liquid diet.  GI following.  CT abdomen/pelvis showed features of pancreatitis,gallstones.  Elevated liver enzymes.  Elevated lipase.  Not a candidate for ERCP as per GI.  Will involve general surgery at some point for possible cholecystectomy. Continue to monitor liver enzymes, lipase.  Elevated troponin: Significant elevated troponin.  Denies any  chest pain.  Nonspecific changes in the EKG.   Echocardiogram pending.  Cardiology closely following.  Currently on heparin drip. She has history of coronary artery disease and is status post CABG.  Suspected DKA: Low CO2, anion gap.  History of diabetes type 2.  On insulin at home.  Started  on insulin drip.  Monitor blood sugars.  Diabetic coordinator following.  Chronic combined  CHF: Currently euvolemic.  Has AICD.  Pacemaker being interrogated today.  Echocardiogram in 2017 had shown 40 to 45% ejection fraction, grade 1 diastolic dysfunction.  Cardiology following.  She is on carvedilol, Lasix, Imdur, lisinopril, statin, spironolactone  Acute on CKD stage III: Secondary to vomiting, poor oral intake.  Continue to monitor.  Continue IV fluids.Monitor kidney function  Suspected UTI:  UA was suggestive of UTI.  Urine culture sent.  Denies dysuria.  Will discontinue antibiotic.  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  On Xarelto at home.  On heparin drip currently.  Lactic acidosis: Secondary to dehydration.  Lactate level improving.  Low suspicion for infectious etiology.  AKI on CKD stage IIIa: Continue IV fluids as per DKA protocol.  Check CMP tomorrow          DVT prophylaxis: IV heparin Code Status: Full code Family Communication: Called and discussed with daughter on phone on 01/13/19 Disposition Plan: Likely home after clinically stability   Consultants: GI, cardiology  Procedures: None  Antimicrobials:  Anti-infectives (From admission, onward)   Start     Dose/Rate Route Frequency Ordered Stop   01/13/19 2200  cefTRIAXone (ROCEPHIN) 2 g in sodium chloride 0.9 % 100 mL IVPB  2 g 200 mL/hr over 30 Minutes Intravenous Every 24 hours 01/12/19 2338     01/12/19 2345  metroNIDAZOLE (FLAGYL) IVPB 500 mg     500 mg 100 mL/hr over 60 Minutes Intravenous Every 8 hours 01/12/19 2338     01/12/19 2300  cefTRIAXone (ROCEPHIN) 2 g in sodium chloride 0.9 % 100 mL IVPB     2 g 200 mL/hr over 30  Minutes Intravenous  Once 01/12/19 2247 01/13/19 0023   01/12/19 2245  cefTRIAXone (ROCEPHIN) 1 g in sodium chloride 0.9 % 100 mL IVPB  Status:  Discontinued     1 g 200 mL/hr over 30 Minutes Intravenous  Once 01/12/19 2242 01/12/19 2247      Subjective:  Patient seen and examined the bedside this morning.  Hemodynamically stable.  Abdominal pain is better.  She feels better.  Denies any severe abdominal pain, denies any chest pain.  Respiratory status stable.  Objective: Vitals:   01/13/19 0700 01/13/19 0800 01/13/19 0830 01/13/19 1244  BP: (!) 141/86 (!) 136/46 (!) 150/91 (!) 154/85  Pulse: (!) 110 (!) 113 (!) 113 (!) 118  Resp: (!) 34 (!) 37 (!) 37 17  Temp:      TempSrc:    Oral  SpO2: 90% (!) 89% 92% 93%  Weight:      Height:        Intake/Output Summary (Last 24 hours) at 01/13/2019 1350 Last data filed at 01/13/2019 0409 Gross per 24 hour  Intake 2700 ml  Output --  Net 2700 ml   Filed Weights   01/12/19 1740  Weight: 103.9 kg    Examination:  General exam: Not in distress, morbidly obese HEENT:PERRL,Oral mucosa moist, Ear/Nose normal on gross exam Respiratory system: Bilateral equal air entry, normal vesicular breath sounds, no wheezes or crackles  Cardiovascular system: Sinus tachycardia, RRR. No JVD, murmurs, rubs, gallops or clicks. No pedal edema. Gastrointestinal system: Abdomen is nondistended, soft and has mild tenderness in the epigastric region. No organomegaly or masses felt. Normal bowel sounds heard. Central nervous system: Alert and oriented. No focal neurological deficits. Extremities: No edema, no clubbing ,no cyanosis, distal peripheral pulses palpable. Skin: No rashes, lesions or ulcers,no icterus ,no pallor .     Data Reviewed: I have personally reviewed following labs and imaging studies  CBC: Recent Labs  Lab 01/12/19 1857 01/13/19 0849  WBC 13.4* 13.0*  NEUTROABS 12.3*  --   HGB 14.6 15.2*  HCT 45.2 46.9*  MCV 101.3* 99.8  PLT 164  0000000   Basic Metabolic Panel: Recent Labs  Lab 01/12/19 1857 01/13/19 0341 01/13/19 0651 01/13/19 0849 01/13/19 1044  NA 135 138 137 138 138  K 5.5* 4.5 5.2* 5.4* 5.0  CL 100 107 106 107 106  CO2 19* 19* 17* 18* 18*  GLUCOSE 490* 262* 294* 331* 340*  BUN 33* 30* 31* 31* 32*  CREATININE 1.70* 1.54* 1.54* 1.50* 1.68*  CALCIUM 9.3 8.9 8.8* 8.8* 8.6*   GFR: Estimated Creatinine Clearance: 36.9 mL/min (A) (by C-G formula based on SCr of 1.68 mg/dL (H)). Liver Function Tests: Recent Labs  Lab 01/12/19 1857 01/13/19 0651  AST 819* 358*  ALT 589* 439*  ALKPHOS 170* 156*  BILITOT 2.1* 3.0*  PROT 7.8 7.3  ALBUMIN 3.5 3.1*   Recent Labs  Lab 01/12/19 1857  LIPASE 4,497*   No results for input(s): AMMONIA in the last 168 hours. Coagulation Profile: Recent Labs  Lab 01/13/19 0651  INR 1.7*   Cardiac Enzymes:  No results for input(s): CKTOTAL, CKMB, CKMBINDEX, TROPONINI in the last 168 hours. BNP (last 3 results) No results for input(s): PROBNP in the last 8760 hours. HbA1C: No results for input(s): HGBA1C in the last 72 hours. CBG: Recent Labs  Lab 01/12/19 2017 01/12/19 2301 01/13/19 0401 01/13/19 0554 01/13/19 0841  GLUCAP 489* 392* 233* 241* 300*   Lipid Profile: No results for input(s): CHOL, HDL, LDLCALC, TRIG, CHOLHDL, LDLDIRECT in the last 72 hours. Thyroid Function Tests: No results for input(s): TSH, T4TOTAL, FREET4, T3FREE, THYROIDAB in the last 72 hours. Anemia Panel: No results for input(s): VITAMINB12, FOLATE, FERRITIN, TIBC, IRON, RETICCTPCT in the last 72 hours. Sepsis Labs: Recent Labs  Lab 01/12/19 1857 01/13/19 0341  LATICACIDVEN 6.1* 2.9*    Recent Results (from the past 240 hour(s))  SARS CORONAVIRUS 2 (TAT 6-24 HRS) Nasopharyngeal Nasopharyngeal Swab     Status: None   Collection Time: 01/12/19  8:45 PM   Specimen: Nasopharyngeal Swab  Result Value Ref Range Status   SARS Coronavirus 2 NEGATIVE NEGATIVE Final    Comment:  (NOTE) SARS-CoV-2 target nucleic acids are NOT DETECTED. The SARS-CoV-2 RNA is generally detectable in upper and lower respiratory specimens during the acute phase of infection. Negative results do not preclude SARS-CoV-2 infection, do not rule out co-infections with other pathogens, and should not be used as the sole basis for treatment or other patient management decisions. Negative results must be combined with clinical observations, patient history, and epidemiological information. The expected result is Negative. Fact Sheet for Patients: SugarRoll.be Fact Sheet for Healthcare Providers: https://www.woods-mathews.com/ This test is not yet approved or cleared by the [place] FDA and  has been authorized for detection and/or diagnosis of SARS-CoV-2 by FDA under an Emergency Use Authorization (EUA). This EUA will remain  in effect (meaning this test can be used) for the duration of the COVID-19 declaration under Section 56 4(b)(1) of the Act, 21 U.S.C. section 360bbb-3(b)(1), unless the authorization is terminated or revoked sooner. Performed at [facility], [place] [address], [place], [place] [address]          Radiology Studies: CT ABDOMEN PELVIS WO CONTRAST  Result Date: 01/12/2019 CLINICAL DATA:  Nausea and vomiting. EXAM: CT ABDOMEN AND PELVIS WITHOUT CONTRAST TECHNIQUE: Multidetector CT imaging of the abdomen and pelvis was performed following the standard protocol without IV contrast. COMPARISON:  May 26, 2014 FINDINGS: Lower chest: Multiple sternal wires are noted. Mild areas of atelectasis and/or infiltrate are seen within the bilateral lung bases. Hepatobiliary: No focal liver abnormality is seen. Numerous subcentimeter gallstones are seen within a mildly distended gallbladder. Pancreas: There is a moderate to marked amount of mesenteric inflammatory fat stranding within the upper abdomen. This involves the peripancreatic  region and extends superiorly along the perigastric region. Inferior extension is seen surrounding the descending colon. Spleen: Normal in size without focal abnormality. Adrenals/Urinary Tract: Adrenal glands are unremarkable. Kidneys are normal in size, without focal lesion or hydronephrosis. A 1.1 cm cluster of subcentimeter non-obstructing renal stones are seen within the mid right kidney. Bladder is unremarkable. Stomach/Bowel: There is a small hiatal hernia. Appendix appears normal. No evidence of bowel wall thickening or distention. Vascular/Lymphatic: There is marked severity aortic atherosclerosis. A stent is seen within the left common iliac vein. No enlarged abdominal or pelvic lymph nodes. Reproductive: The uterus is unremarkable. A 4.9 cm x 3.8 cm cystic appearing areas seen within the right adnexa. Other: A 3.9 cm x 2.9 cm fat containing periumbilical  hernia is seen. Musculoskeletal: No acute or significant osseous findings. IMPRESSION: 1. Moderate to marked severity inflammatory process within the upper abdomen. This may be consistent acute pancreatitis, however, given the pattern of distribution of inflammatory fat stranding, associated inflammatory processes involving the stomach and/or descending colon cannot be excluded. 2. Cholelithiasis. 3. Right adnexal cyst, likely ovarian in origin. Electronically Signed   By: Virgina Norfolk M.D.   On: 01/12/2019 21:19   DG Chest Portable 1 View  Result Date: 01/12/2019 CLINICAL DATA:  Shortness of breath. Persistent vomiting. EXAM: PORTABLE CHEST 1 VIEW COMPARISON:  10/05/2015 FINDINGS: Heart size and pulmonary vascularity are within normal limits considering the AP portable technique. Aortic atherosclerosis. AICD. New slight bibasilar atelectasis. No consolidative infiltrates or effusions. No acute bone abnormality. CABG. IMPRESSION: New slight bibasilar atelectasis. Aortic Atherosclerosis (ICD10-I70.0). Electronically Signed   By: Lorriane Shire M.D.    On: 01/12/2019 18:34        Scheduled Meds: . latanoprost  1 drop Both Eyes QHS  . metoprolol tartrate  5 mg Intravenous Q8H   Continuous Infusions: . sodium chloride 75 mL/hr at 01/13/19 0912  . cefTRIAXone (ROCEPHIN)  IV    . dextrose 5 % and 0.45% NaCl Stopped (01/13/19 0912)  . heparin 1,100 Units/hr (01/13/19 0539)  . insulin Stopped (01/13/19 0409)  . metronidazole 500 mg (01/13/19 1336)     LOS: 1 day    Time spent:35 mins, More than 50% of that time was spent in counseling and/or coordination of care.      Shelly Coss, MD [facility] Pager [phone]  If 7PM-7AM, please contact night-coverage [email or web address] Password [email or web address] 01/13/2019, 1:50 PM

## 2019-01-13 NOTE — Progress Notes (Addendum)
Inpatient Diabetes Program Recommendations  AACE/ADA: New Consensus Statement on Inpatient Glycemic Control (2015)  Target Ranges:  Prepandial:   less than 140 mg/dL      Peak postprandial:   less than 180 mg/dL (1-2 hours)      Critically ill patients:  140 - 180 mg/dL   Results for [name], [name] (MRN [id number]) as of 01/13/2019 07:36  Ref. Range 01/12/2019 17:38 01/12/2019 20:17 01/12/2019 23:01 01/13/2019 04:01 01/13/2019 05:54  Glucose-Capillary Latest Ref Range: 70 - 99 mg/dL 468 (H) 489 (H)  IV Insulin Drip started at 8:35pm 392 (H)  IV Insulin Drip 233 (H)  IV Insulin Drip Stopped 241 (H)   Results for [name], [name] (MRN [id number]) as of 01/13/2019 07:36  Ref. Range 01/13/2019 03:41  Beta-Hydroxybutyric Acid Latest Ref Range: 0.05 - 0.27 mmol/L 0.07   Admit with: Acute pancreatitis likely related to gallstones/ Possible DKA   History: DM, CHF, CKD3  Home DM Meds: Glipizide 5 mg Daily       Levemir 15-20 units Every Other Day       Novolog 50 units AM/ 25 units Lunch/ 50 units Dinner       Ozempic 0.5 mg Qweek  Current Orders: IV Insulin Drip    MD- Note 6:51am BMET shows the following: Glucose 294 mg/dl Anion Gap 14 CO2 level 17  Not sure why IV Insulin Drip was stopped around 4am??  Consider restarting the IV Insulin Drip per the Endotool this AM  OR  Please start Levemir and Novolog for this patient if IV Insulin Drip not restarted 1. Levemir 15 units Daily (0.15 units/kg)--Please start asap 2. Novolog Moderate Correction Scale/ SSI (0-15 units) Q4 hours    --Will follow patient during hospitalization--  Wyn Quaker RN, MSN, CDE Diabetes Coordinator Inpatient Glycemic Control Team Team Pager: [phone] (8a-5p)

## 2019-01-13 NOTE — ED Notes (Signed)
Date and time results received: 01/13/19 7:45 AM  (use smartphrase ".now" to insert current time)  Test: Troponin Critical Value: 688  Name of Provider Notified: [email or web address]  Orders Received? Or Actions Taken?:awaiting

## 2019-01-13 NOTE — Consult Note (Addendum)
Referring Provider:  [facility]         Primary Care Physician:  Everardo Beals, NP Primary Gastroenterologist:   Wilfrid Lund, MD           We were asked to see this patient for:    Pancreatitis.            ASSESSMENT /  PLAN    69 yo female with pmh as below and including but not limited to sleep apnea / glaucoma / DM2 / HTN / hx of DVT / CAD remote CABG /  / remote CABG / combined systolic and diastolic heart failure / AICD / BBB / arthritis / hx of colon polyps ( TA) in 2018 for 5 year recall in Oct 2023.  1. Acute pancreatitis ( ? With SIRS). She started Ozempic 2-3 months ago which has been associated with pancreatitis but overall picture most compatible with biliary origin given cholelithiasis and abnormal liver tests. Has AKI, leukocytosis, increased respirations, tachycardic ( takes beta blocker at home). Non-contrast CT scan with moderate to severe inflammation in upper abdomen. No mention of acute fluid collections / necrosis. No biliary duct dilation. Not MRCP candidate due to AICD.   --sips of clears --trend liver tests --Continue IV fluids. Mild increase in HCT overnight 45 >> 47. BUN up but stable overnight at 31. She received total of 2.7 liters fluid yesterday. Currently getting 75 ml hour now (has hx of heart failure). BMET and CBC tomorrow. Monitor respiratory status with aggressive IVF. 02 sats are 87-92%. New slight bibasilar atelectasis on yesterday's CXR  2. ? DKA, on IV insulin per DKA protocol  3. AKI on CKD Cr 1.68, baseline 1.1 to 1.2. Hopefully fluid resuscitation will help  4. Hx of DVT, on chronic Xarelto. Xarelto on hold. Started on IV heparin  5. Elevated troponin / extensive cardiac history. Cardiology has evaluated, no acute EKG changes. Elevation could be secondary to demand ischemia. Trending troponin and obtaining echocardiogram.   HPI:    Chief Complaint:  Abdominal pain   [name] is a 69 y.o. female with multiple medical  problems presented to ED yesterday with 3 days history of N/V and diffuse, constant non-radiating upper abdominal pain. No diarrhea or bowel changes. Denies fevers. Never had this type of pain before. Took pepto bismul but it didn't help. Finally called EMS. ED labs- Lipase ~ 5K. Liver tests elevated. CT scan wo contrast c/w acute pancreatitis  She is comfortable now after Morphine. Patient non-drinker.She has chronic SOB, says breathing is at baseline right now. Gets SOB with any exertion she says. No cough.    Past Medical History:  Diagnosis Date  . AICD (automatic cardioverter/defibrillator) present   . Anxiety   . Arthritis   . Bundle branch block 05/2016  . CHF (congestive heart failure) ([place])   . Chronic combined systolic and diastolic CHF, NYHA class 3 ([place])   . Coronary artery disease    s/p CABG 2007  . Depression   . Dermal mycosis   . Diabetes mellitus    type II  . DVT (deep venous thrombosis) ([place]) 09/2015   bilateral  . Glaucoma   . Hyperlipidemia   . Hypertension   . Insomnia   . Ischemic cardiomyopathy    EF 30-35%, s/p ICD 4/08 by Dr Leonia Reeves  . Joint pain   . Morbid obesity ([place]) 11/06/2012  . Pain in left knee   . Peripheral neuropathy   . S/P CABG x  5 10/05/2005   LIMA to D2, SVG to ramus intermediate, sequential SVG to OM1-OM2, SVG to RCA with EVH via both legs   . Sleep apnea    uses O2 at night  . Tinea   . Ventricular tachycardia ([place]) 01/16/15   sustained VT terminated with ATP, CL 340 msec    Past Surgical History:  Procedure Laterality Date  . BI-VENTRICULAR IMPLANTABLE CARDIOVERTER DEFIBRILLATOR UPGRADE N/A 09/25/2012   Procedure: BI-VENTRICULAR IMPLANTABLE CARDIOVERTER DEFIBRILLATOR UPGRADE;  Surgeon: Coralyn Mark, MD;  Location: [facility] CATH LAB;  Service: Cardiovascular;  Laterality: N/A;  . BREAST BIOPSY Right 04/05/2014  . CARDIAC CATHETERIZATION    . CARDIAC DEFIBRILLATOR PLACEMENT  4/08   by Dr Leonia Reeves (MDT)  . COLONOSCOPY WITH PROPOFOL N/A  10/12/2016   Procedure: COLONOSCOPY WITH PROPOFOL;  Surgeon: Doran Stabler, MD;  Location: [facility] ENDOSCOPY;  Service: Gastroenterology;  Laterality: N/A;  . CORONARY ARTERY BYPASS GRAFT  10/05/2005   by Dr Cyndia Bent  . IMPLANTABLE CARDIOVERTER DEFIBRILLATOR IMPLANT  09/25/12   attempt of upgrade to CRT-D unsuccessful due to CS anatomy, SJM Unify Asaura device placed with LV port capped by Dr Rayann Heman  . IR GENERIC HISTORICAL  10/03/2015   IR US GUIDE VASC ACCESS LEFT 10/03/2015 Jacqulynn Cadet, MD [facility]  . IR GENERIC HISTORICAL  10/03/2015   IR VENO/EXT/UNI LEFT 10/03/2015 Jacqulynn Cadet, MD [facility]  . IR GENERIC HISTORICAL  10/03/2015   IR VENOCAVAGRAM IVC 10/03/2015 Jacqulynn Cadet, MD [facility]  . IR GENERIC HISTORICAL  10/03/2015   IR INFUSION THROMBOL VENOUS INITIAL (MS) 10/03/2015 Jacqulynn Cadet, MD [facility]  . IR GENERIC HISTORICAL  10/03/2015   IR US GUIDE VASC ACCESS LEFT 10/03/2015 Jacqulynn Cadet, MD [facility]  . IR GENERIC HISTORICAL  10/04/2015   IR THROMBECT VENO MECH MOD SED 10/04/2015 Sandi Mariscal, MD [facility]  . IR GENERIC HISTORICAL  10/04/2015   IR TRANSCATH PLC STENT 1ST ART NOT LE CV CAR VERT CAR 10/04/2015 Sandi Mariscal, MD [facility]  . IR GENERIC HISTORICAL  10/04/2015   IR THROMB F/U EVAL ART/VEN FINAL DAY (MS) 10/04/2015 Sandi Mariscal, MD [facility]  . IR GENERIC HISTORICAL  11/02/2015   IR RADIOLOGIST EVAL & MGMT 11/02/2015 Sandi Mariscal, MD [facility]  . IR RADIOLOGIST EVAL & MGMT  04/26/2016    Prior to Admission medications   Medication Sig Start Date End Date Taking? Authorizing Provider  albuterol (PROVENTIL HFA;VENTOLIN HFA) 108 (90 BASE) MCG/ACT inhaler Inhale 2 puffs into the lungs every 6 (six) hours as needed for wheezing or shortness of breath.   Yes [provider]  albuterol (PROVENTIL) (2.5 MG/3ML) 0.083% nebulizer solution Take 2.5 mg by nebulization every 6 (six) hours as needed for wheezing or shortness of breath.   09/30/15  Yes [provider]  amitriptyline (ELAVIL) 10 MG tablet Take 10 mg by mouth daily. 07/25/16  Yes [provider]  Capsaicin (MUSCLE RELIEF EX) Apply 1 application topically 3 (three) times daily as needed. For pain 08/09/16  Yes [provider]  carvedilol (COREG) 12.5 MG tablet Take 12.5 mg by mouth 2 (two) times daily with a meal.  03/30/15  Yes [provider]  ferrous sulfate 300 (60 Fe) MG/5ML syrup Take 300 mg by mouth daily with lunch.   Yes [provider]  furosemide (LASIX) 80 MG tablet TAKE 1 TABLET TWICE DAILY Patient taking differently: Take 80 mg by mouth 2 (two) times daily.  12/31/18  Yes  Weaver, Scott T, PA-C  glipiZIDE (GLUCOTROL) 5 MG tablet Take 5 mg by mouth daily before breakfast.    Yes [provider]  isosorbide mononitrate (IMDUR) 30 MG 24 hr tablet TAKE 1 TABLET (30 MG TOTAL) BY MOUTH DAILY. 12/31/18  Yes Weaver, Scott T, PA-C  ketoconazole (NIZORAL) 2 % cream Apply 1 application topically 2 (two) times daily as needed (for skin irritation).  03/30/15  Yes [provider]  latanoprost (XALATAN) 0.005 % ophthalmic solution Place 1 drop into both eyes at bedtime.   Yes [provider]  LEVEMIR FLEXTOUCH 100 UNIT/ML Pen Inject 15-20 Units into the skin at bedtime. Patient states she takes every other day in the pm.  Patient takes 15-20 units if glucose is greater than 140 per patient 10/19/15  Yes [provider]  lisinopril (PRINIVIL,ZESTRIL) 10 MG tablet Take 10 mg by mouth daily.    Yes [provider]  methocarbamol (ROBAXIN) 500 MG tablet Take 1 tablet (500 mg total) by mouth every 8 (eight) hours as needed for muscle spasms. 05/30/14  Yes Barton Dubois, MD  nitroGLYCERIN (NITROSTAT) 0.4 MG SL tablet Place 0.4 mg under the tongue every 5 (five) minutes as needed for chest pain.   Yes [provider]  NOVOLOG FLEXPEN 100 UNIT/ML FlexPen Inject 30-50 Units into the skin  See admin instructions. 50 units every morning, 25 units at lunch and 50 units at supper *Hold for low blood sugar* 09/12/15  Yes [provider]  oxybutynin (DITROPAN-XL) 10 MG 24 hr tablet Take 10 mg by mouth daily. 01/12/18  Yes [provider]  Potassium Chloride ER 20 MEQ TBCR Take 20 mEq by mouth daily. 07/04/16  Yes [provider]  rosuvastatin (CRESTOR) 20 MG tablet Take 1 tablet (20 mg total) by mouth daily at 6 PM. 10/07/15  Yes Minus Liberty, MD  Semaglutide (OZEMPIC, 0.25 OR 0.5 MG/DOSE, [place]) Inject 0.5 mg into the skin once a week.    Yes [provider]  spironolactone (ALDACTONE) 25 MG tablet Take 25 mg by mouth daily.   Yes [provider]  tolterodine (DETROL LA) 4 MG 24 hr capsule Take 4 mg by mouth daily. 01/12/18  Yes [provider]  traMADol (ULTRAM) 50 MG tablet Take 50 mg by mouth 2 (two) times daily. 11/28/15  Yes [provider]  Vitamin D, Ergocalciferol, (DRISDOL) 1.25 MG (50000 UT) CAPS capsule Take 1 capsule (50,000 Units total) by mouth every 7 (seven) days. 12/30/18  Yes Jearld Lesch A, DO  XARELTO 20 MG TABS tablet Take 20 mg by mouth daily.  12/14/15  Yes [provider]    Current Facility-Administered Medications  Medication Dose Route Frequency Provider Last Rate Last Admin  . 0.9 %  sodium chloride infusion   Intravenous Continuous Rise Patience, MD 75 mL/hr at 01/13/19 0912 Restarted at 01/13/19 0912  . albuterol (VENTOLIN HFA) 108 (90 Base) MCG/ACT inhaler 2 puff  2 puff Inhalation Q6H PRN Rise Patience, MD      . cefTRIAXone (ROCEPHIN) 2 g in sodium chloride 0.9 % 100 mL IVPB  2 g Intravenous Q24H Rise Patience, MD      . dextrose 5 %-0.45 % sodium chloride infusion   Intravenous Continuous Rise Patience, MD   Stopped at 01/13/19 [phone]  . dextrose 50 % solution 0-50 mL  0-50 mL Intravenous PRN Rise Patience, MD      . heparin ADULT infusion 100 units/mL  (25000 units/275mL  sodium chloride 0.45%)  1,100 Units/hr Intravenous Continuous Polly Cobia, RPH 11 mL/hr at 01/13/19 0539 1,100 Units/hr at 01/13/19 0539  . insulin regular, human (MYXREDLIN) 100 units/ 100 mL infusion   Intravenous Continuous Rise Patience, MD   Stopped at 01/13/19 [phone]  . latanoprost (XALATAN) 0.005 % ophthalmic solution 1 drop  1 drop Both Eyes QHS Rise Patience, MD   1 drop at 01/13/19 0542  . metroNIDAZOLE (FLAGYL) IVPB 500 mg  500 mg Intravenous Q8H Rise Patience, MD 100 mL/hr at 01/13/19 0911 500 mg at 01/13/19 0911  . morphine 2 MG/ML injection 2 mg  2 mg Intravenous Q3H PRN Rise Patience, MD   2 mg at 01/13/19 0540  . ondansetron (ZOFRAN) injection 4 mg  4 mg Intravenous Q6H PRN Rise Patience, MD       Current Outpatient Medications  Medication Sig Dispense Refill  . albuterol (PROVENTIL HFA;VENTOLIN HFA) 108 (90 BASE) MCG/ACT inhaler Inhale 2 puffs into the lungs every 6 (six) hours as needed for wheezing or shortness of breath.    Marland Kitchen albuterol (PROVENTIL) (2.5 MG/3ML) 0.083% nebulizer solution Take 2.5 mg by nebulization every 6 (six) hours as needed for wheezing or shortness of breath.     Marland Kitchen amitriptyline (ELAVIL) 10 MG tablet Take 10 mg by mouth daily.    . Capsaicin (MUSCLE RELIEF EX) Apply 1 application topically 3 (three) times daily as needed. For pain    . carvedilol (COREG) 12.5 MG tablet Take 12.5 mg by mouth 2 (two) times daily with a meal.     . ferrous sulfate 300 (60 Fe) MG/5ML syrup Take 300 mg by mouth daily with lunch.    . furosemide (LASIX) 80 MG tablet TAKE 1 TABLET TWICE DAILY (Patient taking differently: Take 80 mg by mouth 2 (two) times daily. ) 180 tablet 1  . glipiZIDE (GLUCOTROL) 5 MG tablet Take 5 mg by mouth daily before breakfast.     . isosorbide mononitrate (IMDUR) 30 MG 24 hr tablet TAKE 1 TABLET (30 MG TOTAL) BY MOUTH DAILY. 90 tablet 2  . ketoconazole (NIZORAL) 2 % cream Apply 1 application  topically 2 (two) times daily as needed (for skin irritation).     Marland Kitchen latanoprost (XALATAN) 0.005 % ophthalmic solution Place 1 drop into both eyes at bedtime.    Marland Kitchen LEVEMIR FLEXTOUCH 100 UNIT/ML Pen Inject 15-20 Units into the skin at bedtime. Patient states she takes every other day in the pm.  Patient takes 15-20 units if glucose is greater than 140 per patient    . lisinopril (PRINIVIL,ZESTRIL) 10 MG tablet Take 10 mg by mouth daily.     . methocarbamol (ROBAXIN) 500 MG tablet Take 1 tablet (500 mg total) by mouth every 8 (eight) hours as needed for muscle spasms. 45 tablet 0  . nitroGLYCERIN (NITROSTAT) 0.4 MG SL tablet Place 0.4 mg under the tongue every 5 (five) minutes as needed for chest pain.    Marland Kitchen NOVOLOG FLEXPEN 100 UNIT/ML FlexPen Inject 30-50 Units into the skin See admin instructions. 50 units every morning, 25 units at lunch and 50 units at supper *Hold for low blood sugar*    . oxybutynin (DITROPAN-XL) 10 MG 24 hr tablet Take 10 mg by mouth daily.    . Potassium Chloride ER 20 MEQ TBCR Take 20 mEq by mouth daily.    . rosuvastatin (CRESTOR) 20 MG tablet Take 1 tablet (20 mg total) by mouth daily at 6 PM. 30 tablet  2  . Semaglutide (OZEMPIC, 0.25 OR 0.5 MG/DOSE, ) Inject 0.5 mg into the skin once a week.     . spironolactone (ALDACTONE) 25 MG tablet Take 25 mg by mouth daily.    Marland Kitchen tolterodine (DETROL LA) 4 MG 24 hr capsule Take 4 mg by mouth daily.    . traMADol (ULTRAM) 50 MG tablet Take 50 mg by mouth 2 (two) times daily.    . Vitamin D, Ergocalciferol, (DRISDOL) 1.25 MG (50000 UT) CAPS capsule Take 1 capsule (50,000 Units total) by mouth every 7 (seven) days. 4 capsule 0  . XARELTO 20 MG TABS tablet Take 20 mg by mouth daily.       Allergies as of 01/12/2019 - Review Complete 01/12/2019  Allergen Reaction Noted  . Lipitor  [atorvastatin calcium] Rash   . Metformin and related Itching, Swelling, and Other (See Comments) 12/27/2011  . Atorvastatin Itching and Rash 03/11/2015   . Levofloxacin Itching 05/26/2014    Family History  Problem Relation Age of Onset  . Diabetes Mother 69       died - HTN  . Stroke Mother   . High blood pressure Mother   . Sudden death Mother   . Obesity Mother   . Other Other        No early family hx of CAD    Social History   Socioeconomic History  . Marital status: Legally Separated    Spouse name: Not on file  . Number of children: 3  . Years of education: 64  . Highest education level: Not on file  Occupational History  . Occupation: retired    Fish farm manager: UNEMPLOYED  Tobacco Use  . Smoking status: Never Smoker  . Smokeless tobacco: Never Used  Substance and Sexual Activity  . Alcohol use: No  . Drug use: No  . Sexual activity: Not Currently  Other Topics Concern  . Not on file  Social History Narrative   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   11/04/15 Lives with son    caffeine - coffee, 1-2 cups daily   Social Determinants of Health   Financial Resource Strain:   . Difficulty of Paying Living Expenses: Not on file  Food Insecurity:   . Worried About Charity fundraiser in the Last Year: Not on file  . Ran Out of Food in the Last Year: Not on file  Transportation Needs:   . Lack of Transportation (Medical): Not on file  . Lack of Transportation (Non-Medical): Not on file  Physical Activity:   . Days of Exercise per Week: Not on file  . Minutes of Exercise per Session: Not on file  Stress:   . Feeling of Stress : Not on file  Social Connections:   . Frequency of Communication with Friends and Family: Not on file  . Frequency of Social Gatherings with Friends and Family: Not on file  . Attends Religious Services: Not on file  . Active Member of Clubs or Organizations: Not on file  . Attends Archivist Meetings: Not on file  . Marital Status: Not on file  Intimate Partner Violence:   . Fear of Current or Ex-Partner: Not on file  . Emotionally Abused: Not on  file  . Physically Abused: Not on file  . Sexually Abused: Not on file    Review of Systems: All systems reviewed and negative except where noted in HPI.  Physical Exam: Vital signs in last 24 hours: Temp:  [  97.4 F (36.3 C)] 97.4 F (36.3 C) (01/04 1739) Pulse Rate:  [96-113] 113 (01/05 0830) Resp:  [18-40] 37 (01/05 0830) BP: (136-163)/(46-95) 150/91 (01/05 0830) SpO2:  [86 %-98 %] 92 % (01/05 0830) Weight:  [103.9 kg] 103.9 kg (01/04 1740)   General:   Alert in NAD Psych:  Pleasant, cooperative. Normal mood and affect. Eyes:  Pupils equal, sclera clear, no icterus.   Conjunctiva pink. Ears:  Normal auditory acuity. Nose:  No deformity, discharge,  or lesions. Neck:  Supple; no masses Lungs:  Decreased breath sounds bilaterally . No wheezing. Breathing somewhat labored. Heart:  Sinus tachycardia, 2+ BLE  edema Abdomen:  Soft, non-distended, nontender, BS active, no palp mass   Rectal:  Deferred  Msk:  Symmetrical without gross deformities. . Neurologic:  Alert and  oriented x4;  grossly normal neurologically. Skin:  Intact without significant lesions or rashes.   Intake/Output from previous day: 01/04 0701 - 01/05 0700 In: 2700 [I.V.:500; IV Piggyback:2200] Out: -  Intake/Output this shift: No intake/output data recorded.  Lab Results: Recent Labs    01/12/19 1857 01/13/19 0849  WBC 13.4* 13.0*  HGB 14.6 15.2*  HCT 45.2 46.9*  PLT 164 162   BMET Recent Labs    01/13/19 0341 01/13/19 0651 01/13/19 0849  NA 138 137 138  K 4.5 5.2* 5.4*  CL 107 106 107  CO2 19* 17* 18*  GLUCOSE 262* 294* 331*  BUN 30* 31* 31*  CREATININE 1.54* 1.54* 1.50*  CALCIUM 8.9 8.8* 8.8*   LFT Recent Labs    01/13/19 0651  PROT 7.3  ALBUMIN 3.1*  AST 358*  ALT 439*  ALKPHOS 156*  BILITOT 3.0*  BILIDIR 1.9*  IBILI 1.1*   PT/INR Recent Labs    01/13/19 0651  LABPROT 19.7*  INR 1.7*   Hepatitis Panel No results for input(s): HEPBSAG, HCVAB, HEPAIGM, HEPBIGM in  the last 72 hours.   . CBC Latest Ref Rng & Units 01/13/2019 01/12/2019 01/31/2018  WBC 4.0 - 10.5 K/uL 13.0(H) 13.4(H) 4.4  Hemoglobin 12.0 - 15.0 g/dL 15.2(H) 14.6 11.8  Hematocrit 36.0 - 46.0 % 46.9(H) 45.2 34.0  Platelets 150 - 400 K/uL 162 164 173    . CMP Latest Ref Rng & Units 01/13/2019 01/13/2019 01/13/2019  Glucose 70 - 99 mg/dL 331(H) 294(H) 262(H)  BUN 8 - 23 mg/dL 31(H) 31(H) 30(H)  Creatinine 0.44 - 1.00 mg/dL 1.50(H) 1.54(H) 1.54(H)  Sodium 135 - 145 mmol/L 138 137 138  Potassium 3.5 - 5.1 mmol/L 5.4(H) 5.2(H) 4.5  Chloride 98 - 111 mmol/L 107 106 107  CO2 22 - 32 mmol/L 18(L) 17(L) 19(L)  Calcium 8.9 - 10.3 mg/dL 8.8(L) 8.8(L) 8.9  Total Protein 6.5 - 8.1 g/dL - 7.3 -  Total Bilirubin 0.3 - 1.2 mg/dL - 3.0(H) -  Alkaline Phos 38 - 126 U/L - 156(H) -  AST 15 - 41 U/L - 358(H) -  ALT 0 - 44 U/L - 439(H) -   Studies/Results: CT ABDOMEN PELVIS WO CONTRAST  Result Date: 01/12/2019 CLINICAL DATA:  Nausea and vomiting. EXAM: CT ABDOMEN AND PELVIS WITHOUT CONTRAST TECHNIQUE: Multidetector CT imaging of the abdomen and pelvis was performed following the standard protocol without IV contrast. COMPARISON:  May 26, 2014 FINDINGS: Lower chest: Multiple sternal wires are noted. Mild areas of atelectasis and/or infiltrate are seen within the bilateral lung bases. Hepatobiliary: No focal liver abnormality is seen. Numerous subcentimeter gallstones are seen within a mildly distended gallbladder. Pancreas: There is a  moderate to marked amount of mesenteric inflammatory fat stranding within the upper abdomen. This involves the peripancreatic region and extends superiorly along the perigastric region. Inferior extension is seen surrounding the descending colon. Spleen: Normal in size without focal abnormality. Adrenals/Urinary Tract: Adrenal glands are unremarkable. Kidneys are normal in size, without focal lesion or hydronephrosis. A 1.1 cm cluster of subcentimeter non-obstructing renal stones are  seen within the mid right kidney. Bladder is unremarkable. Stomach/Bowel: There is a small hiatal hernia. Appendix appears normal. No evidence of bowel wall thickening or distention. Vascular/Lymphatic: There is marked severity aortic atherosclerosis. A stent is seen within the left common iliac vein. No enlarged abdominal or pelvic lymph nodes. Reproductive: The uterus is unremarkable. A 4.9 cm x 3.8 cm cystic appearing areas seen within the right adnexa. Other: A 3.9 cm x 2.9 cm fat containing periumbilical hernia is seen. Musculoskeletal: No acute or significant osseous findings. IMPRESSION: 1. Moderate to marked severity inflammatory process within the upper abdomen. This may be consistent acute pancreatitis, however, given the pattern of distribution of inflammatory fat stranding, associated inflammatory processes involving the stomach and/or descending colon cannot be excluded. 2. Cholelithiasis. 3. Right adnexal cyst, likely ovarian in origin. Electronically Signed   By: Virgina Norfolk M.D.   On: 01/12/2019 21:19   DG Chest Portable 1 View  Result Date: 01/12/2019 CLINICAL DATA:  Shortness of breath. Persistent vomiting. EXAM: PORTABLE CHEST 1 VIEW COMPARISON:  10/05/2015 FINDINGS: Heart size and pulmonary vascularity are within normal limits considering the AP portable technique. Aortic atherosclerosis. AICD. New slight bibasilar atelectasis. No consolidative infiltrates or effusions. No acute bone abnormality. CABG. IMPRESSION: New slight bibasilar atelectasis. Aortic Atherosclerosis (ICD10-I70.0). Electronically Signed   By: Lorriane Shire M.D.   On: 01/12/2019 18:34    Principal Problem:   Acute pancreatitis Active Problems:   S/P CABG x 5   Chronic combined systolic and diastolic heart failure (HCC)   AKI (acute kidney injury) ([place])   DVT (deep venous thrombosis) ([place])   DKA (diabetic ketoacidoses) ([place])    Tye Savoy, NP-C @  01/13/2019, 10:46 AM

## 2019-01-13 NOTE — Progress Notes (Signed)
   ED is going to interrogate PPM and call me when results available.   Daune Perch, AGNP-C [facility] 01/13/2019  12:52 PM

## 2019-01-13 NOTE — Progress Notes (Signed)
[place] for IV heparin Indication: Hx DVT  Allergies  Allergen Reactions  . Lipitor  [Atorvastatin Calcium] Rash  . Metformin And Related Itching, Swelling and Other (See Comments)    Leg pain & swelling in legs  . Atorvastatin Itching and Rash  . Levofloxacin Itching    Patient Measurements: Height: 5\' 3"  (160 cm) Weight: 229 lb (103.9 kg) IBW/kg (Calculated) : 52.4 Heparin Dosing Weight: 77 kg  Vital Signs: Temp: 97.4 F (36.3 C) (01/04 1739) Temp Source: Oral (01/04 1739) BP: 136/73 (01/05 0430) Pulse Rate: 102 (01/05 0430)  Labs: Recent Labs    01/12/19 1857 01/13/19 0341  HGB 14.6  --   HCT 45.2  --   PLT 164  --   CREATININE 1.70* 1.54*  TROPONINIHS 86*  --     Estimated Creatinine Clearance: 40.3 mL/min (A) (by C-G formula based on SCr of 1.54 mg/dL (H)).   Medical History: Past Medical History:  Diagnosis Date  . AICD (automatic cardioverter/defibrillator) present   . Angina decubitus ([place]) 11/06/2012  . Angina pectoris ([place])   . Anxiety   . Arthritis   . Back pain   . Bundle branch block 05/2016  . CHF (congestive heart failure) ([place])   . Chronic combined systolic and diastolic CHF, NYHA class 3 ([place])   . Coronary artery disease    s/p CABG 2007  . Depression   . Dermal mycosis   . Diabetes mellitus    type II  . DVT (deep venous thrombosis) ([place]) 09/2015   bilateral  . Edema, lower extremity   . Glaucoma   . Hyperlipidemia   . Hypertension   . Insomnia   . Ischemic cardiomyopathy    EF 30-35%, s/p ICD 4/08 by Dr Leonia Reeves  . Joint pain   . Morbid obesity ([place]) 11/06/2012  . Obesity   . Oxygen dependent 2 L  . Pain in left knee   . Peripheral neuropathy   . 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 Sleep apnea    uses O2 at night  . SOB (shortness of breath)   . Tinea   . Ventricular tachycardia ([place])  01/16/15   sustained VT terminated with ATP, CL 340 msec    Medications:  (Not in a hospital admission)  Scheduled:  . heparin  4,000 Units Intravenous Once  . latanoprost  1 drop Both Eyes QHS   Infusions:  . sodium chloride Stopped (01/13/19 0409)  . cefTRIAXone (ROCEPHIN)  IV    . dextrose 5 % and 0.45% NaCl 75 mL/hr at 01/13/19 0416  . heparin    . insulin Stopped (01/13/19 0409)  . metronidazole Stopped (01/13/19 0129)    Assessment: 71 yoF with extensive cardiac history including DVT on Xarelto, CAD s/p CABG 2007, HFrEF with AICD, HTN, HLD, DM, presents with n/v d/t pancreatitis. Pharmacy consulted to dose IV heparin while Xarelto on hold   Baseline INR, aPTT: not drawn prior to starting heparin; disregard  Prior anticoagulation: Xarelto 20 mg daily, last dose 01/11/19  Significant events:  Today, 01/13/2019:  CBC: WNL  HS troponin elevated at 3x ULN  SCr slightly above baseline  No bleeding or infusion issues per nursing  Goal of Therapy: Heparin level 0.3-0.7 units/ml Monitor platelets by anticoagulation protocol: Yes  Plan:  Heparin 4000 units IV bolus x 1  Heparin  1100 units/hr IV infusion  Check heparin level 8 hrs after start  Daily CBC and heparin level; aPTT as needed while heparin level elevated from recent DOAC  Monitor for signs of bleeding or thrombosis  Reuel Boom, PharmD, BCPS [phone] 01/13/2019, 5:08 AM

## 2019-01-14 DIAGNOSIS — K859 Acute pancreatitis without necrosis or infection, unspecified: Secondary | ICD-10-CM

## 2019-01-14 DIAGNOSIS — R7989 Other specified abnormal findings of blood chemistry: Secondary | ICD-10-CM

## 2019-01-14 DIAGNOSIS — I5042 Chronic combined systolic (congestive) and diastolic (congestive) heart failure: Secondary | ICD-10-CM

## 2019-01-14 DIAGNOSIS — Z951 Presence of aortocoronary bypass graft: Secondary | ICD-10-CM

## 2019-01-14 DIAGNOSIS — N179 Acute kidney failure, unspecified: Secondary | ICD-10-CM

## 2019-01-14 DIAGNOSIS — R945 Abnormal results of liver function studies: Secondary | ICD-10-CM

## 2019-01-14 LAB — CBC WITH DIFFERENTIAL/PLATELET
Abs Immature Granulocytes: 0.15 10*3/uL — ABNORMAL HIGH (ref 0.00–0.07)
Basophils Absolute: 0.1 10*3/uL (ref 0.0–0.1)
Basophils Relative: 0 %
Eosinophils Absolute: 0 10*3/uL (ref 0.0–0.5)
Eosinophils Relative: 0 %
HCT: 43.2 % (ref 36.0–46.0)
Hemoglobin: 13.7 g/dL (ref 12.0–15.0)
Immature Granulocytes: 1 %
Lymphocytes Relative: 9 %
Lymphs Abs: 1.3 10*3/uL (ref 0.7–4.0)
MCH: 32.2 pg (ref 26.0–34.0)
MCHC: 31.7 g/dL (ref 30.0–36.0)
MCV: 101.6 fL — ABNORMAL HIGH (ref 80.0–100.0)
Monocytes Absolute: 1.1 10*3/uL — ABNORMAL HIGH (ref 0.1–1.0)
Monocytes Relative: 8 %
Neutro Abs: 11.6 10*3/uL — ABNORMAL HIGH (ref 1.7–7.7)
Neutrophils Relative %: 82 %
Platelets: 146 10*3/uL — ABNORMAL LOW (ref 150–400)
RBC: 4.25 MIL/uL (ref 3.87–5.11)
RDW: 13.6 % (ref 11.5–15.5)
WBC: 14.2 10*3/uL — ABNORMAL HIGH (ref 4.0–10.5)
nRBC: 0 % (ref 0.0–0.2)

## 2019-01-14 LAB — GLUCOSE, CAPILLARY
Glucose-Capillary: 191 mg/dL — ABNORMAL HIGH (ref 70–99)
Glucose-Capillary: 197 mg/dL — ABNORMAL HIGH (ref 70–99)
Glucose-Capillary: 215 mg/dL — ABNORMAL HIGH (ref 70–99)
Glucose-Capillary: 219 mg/dL — ABNORMAL HIGH (ref 70–99)
Glucose-Capillary: 224 mg/dL — ABNORMAL HIGH (ref 70–99)
Glucose-Capillary: 228 mg/dL — ABNORMAL HIGH (ref 70–99)
Glucose-Capillary: 233 mg/dL — ABNORMAL HIGH (ref 70–99)
Glucose-Capillary: 233 mg/dL — ABNORMAL HIGH (ref 70–99)
Glucose-Capillary: 238 mg/dL — ABNORMAL HIGH (ref 70–99)
Glucose-Capillary: 254 mg/dL — ABNORMAL HIGH (ref 70–99)
Glucose-Capillary: 276 mg/dL — ABNORMAL HIGH (ref 70–99)
Glucose-Capillary: 294 mg/dL — ABNORMAL HIGH (ref 70–99)
Glucose-Capillary: 392 mg/dL — ABNORMAL HIGH (ref 70–99)

## 2019-01-14 LAB — COMPREHENSIVE METABOLIC PANEL
ALT: 265 U/L — ABNORMAL HIGH (ref 0–44)
AST: 153 U/L — ABNORMAL HIGH (ref 15–41)
Albumin: 3 g/dL — ABNORMAL LOW (ref 3.5–5.0)
Alkaline Phosphatase: 131 U/L — ABNORMAL HIGH (ref 38–126)
Anion gap: 11 (ref 5–15)
BUN: 39 mg/dL — ABNORMAL HIGH (ref 8–23)
CO2: 19 mmol/L — ABNORMAL LOW (ref 22–32)
Calcium: 8.2 mg/dL — ABNORMAL LOW (ref 8.9–10.3)
Chloride: 107 mmol/L (ref 98–111)
Creatinine, Ser: 1.83 mg/dL — ABNORMAL HIGH (ref 0.44–1.00)
GFR calc Af Amer: 32 mL/min — ABNORMAL LOW (ref >60)
GFR calc non Af Amer: 28 mL/min — ABNORMAL LOW (ref >60)
Glucose, Bld: 259 mg/dL — ABNORMAL HIGH (ref 70–99)
Potassium: 4.7 mmol/L (ref 3.5–5.1)
Sodium: 137 mmol/L (ref 135–145)
Total Bilirubin: 2.5 mg/dL — ABNORMAL HIGH (ref 0.3–1.2)
Total Protein: 6.8 g/dL (ref 6.5–8.1)

## 2019-01-14 LAB — HEPARIN LEVEL (UNFRACTIONATED)
Heparin Unfractionated: 0.65 IU/mL (ref 0.30–0.70)
Heparin Unfractionated: 0.47 IU/mL (ref 0.30–0.70)

## 2019-01-14 LAB — HEMOGLOBIN A1C
Hgb A1c MFr Bld: 8.3 % — ABNORMAL HIGH (ref 4.8–5.6)
Mean Plasma Glucose: 192 mg/dL

## 2019-01-14 LAB — LACTIC ACID, PLASMA: Lactic Acid, Venous: 2.5 mmol/L (ref 0.5–1.9)

## 2019-01-14 LAB — APTT: aPTT: 118 seconds — ABNORMAL HIGH (ref 24–36)

## 2019-01-14 MED ORDER — SODIUM CHLORIDE 0.9 % IV BOLUS
500.0000 mL | Freq: Once | INTRAVENOUS | Status: AC
  Administered 2019-01-14: 500 mL via INTRAVENOUS

## 2019-01-14 MED ORDER — TRAMADOL HCL 50 MG PO TABS
50.0000 mg | ORAL_TABLET | Freq: Four times a day (QID) | ORAL | Status: DC | PRN
  Administered 2019-01-14 – 2019-01-15 (×3): 50 mg via ORAL
  Filled 2019-01-14 (×3): qty 1

## 2019-01-14 MED ORDER — SODIUM CHLORIDE 0.9 % IV SOLN
1.0000 g | INTRAVENOUS | Status: DC
  Filled 2019-01-14 (×2): qty 10

## 2019-01-14 MED ORDER — INSULIN GLARGINE 100 UNIT/ML ~~LOC~~ SOLN
20.0000 [IU] | Freq: Every day | SUBCUTANEOUS | Status: DC
  Administered 2019-01-14 – 2019-01-15 (×2): 20 [IU] via SUBCUTANEOUS
  Filled 2019-01-14 (×2): qty 0.2

## 2019-01-14 MED ORDER — INSULIN ASPART 100 UNIT/ML ~~LOC~~ SOLN
0.0000 [IU] | Freq: Three times a day (TID) | SUBCUTANEOUS | Status: DC
  Administered 2019-01-14: 3 [IU] via SUBCUTANEOUS
  Administered 2019-01-14: 5 [IU] via SUBCUTANEOUS
  Administered 2019-01-14: 3 [IU] via SUBCUTANEOUS
  Administered 2019-01-15 (×3): 5 [IU] via SUBCUTANEOUS
  Administered 2019-01-16: 2 [IU] via SUBCUTANEOUS
  Administered 2019-01-16 (×2): 3 [IU] via SUBCUTANEOUS
  Administered 2019-01-17: 1 [IU] via SUBCUTANEOUS
  Administered 2019-01-17 – 2019-01-18 (×2): 2 [IU] via SUBCUTANEOUS
  Administered 2019-01-18 – 2019-01-19 (×2): 1 [IU] via SUBCUTANEOUS
  Administered 2019-01-20: 3 [IU] via SUBCUTANEOUS
  Administered 2019-01-20: 2 [IU] via SUBCUTANEOUS
  Administered 2019-01-20: 3 [IU] via SUBCUTANEOUS

## 2019-01-14 MED ORDER — INSULIN ASPART 100 UNIT/ML ~~LOC~~ SOLN
0.0000 [IU] | Freq: Every day | SUBCUTANEOUS | Status: DC
  Administered 2019-01-14: 4 [IU] via SUBCUTANEOUS
  Administered 2019-01-15 – 2019-01-17 (×2): 2 [IU] via SUBCUTANEOUS

## 2019-01-14 NOTE — Progress Notes (Signed)
[place] for IV heparin Indication: Hx DVT  Allergies  Allergen Reactions  . Lipitor  [Atorvastatin Calcium] Rash  . Metformin And Related Itching, Swelling and Other (See Comments)    Leg pain & swelling in legs  . Atorvastatin Itching and Rash  . Levofloxacin Itching    Patient Measurements: Height: 5\' 3"  (160 cm) Weight: 229 lb (103.9 kg) IBW/kg (Calculated) : 52.4 Heparin Dosing Weight: 77 kg  Vital Signs: Temp: 98.1 F (36.7 C) (01/06 0800) Temp Source: Oral (01/06 0800) BP: 171/84 (01/06 0400) Pulse Rate: 102 (01/06 0400)  Labs: Recent Labs    01/12/19 1857 01/12/19 1857 01/13/19 0639 01/13/19 0651 01/13/19 0849 01/13/19 1044 01/13/19 1356 01/13/19 2341 01/14/19 0140 01/14/19 0352  HGB 14.6  --   --   --  15.2*  --   --   --  13.7  --   HCT 45.2  --   --   --  46.9*  --   --   --  43.2  --   PLT 164  --   --   --  162  --   --   --  146*  --   APTT  --    < >  --  165* 131*  --  100* 118*  --   --   LABPROT  --   --   --  19.7*  --   --   --   --   --   --   INR  --   --   --  1.7*  --   --   --   --   --   --   HEPARINUNFRC  --   --  1.09*  --   --   --   --  0.65  --  0.47  CREATININE 1.70*   < >  --  1.54* 1.50* 1.68*  --   --  1.83*  --   TROPONINIHS 86*   < >  --  688* 843* 1,023* 1,023*  --   --   --    < > = values in this interval not displayed.    Estimated Creatinine Clearance: 33.9 mL/min (A) (by C-G formula based on SCr of 1.83 mg/dL (H)).  Medications:  PTA Xarelto 20mg  daily - Last dose 01/11/19 @ 22:00  Scheduled:  . Chlorhexidine Gluconate Cloth  6 each Topical Daily  . insulin aspart  0-5 Units Subcutaneous QHS  . insulin aspart  0-9 Units Subcutaneous TID WC  . insulin glargine  20 Units Subcutaneous Daily  . latanoprost  1 drop Both Eyes QHS  . mouth rinse  15 mL Mouth Rinse BID  . metoprolol tartrate  5 mg Intravenous Q8H   Infusions:  . sodium chloride Stopped (01/14/19 0027)  . heparin  1,050 Units/hr (01/14/19 0600)    Assessment: 49 yoF with extensive cardiac history including DVT on Xarelto, CAD s/p CABG 2007, HFrEF with AICD, HTN, HLD, DM, presents with n/v d/t pancreatitis. Pharmacy consulted to dose IV heparin while Xarelto on hold   Prior anticoagulation: Xarelto 20 mg daily, last dose 01/11/19  Significant events:  Today, 01/14/2019:  Heparin level now therapeutic on current IV heparin rate of 1050 units/hr.   CBC ok  No bleeding or infusion issues noted  Goal of Therapy: Heparin level 0.3-0.7 units/ml Monitor platelets by anticoagulation protocol: Yes  Plan:  Continue heparin at 1050 units/hr IV infusion  Daily CBC  and heparin level  Monitor for signs of bleeding or thrombosis  F/u for ability to transition back to [place], PharmD, BCPS Pager [phone] 01/14/2019 9:29 AM

## 2019-01-14 NOTE — Progress Notes (Signed)
CRITICAL VALUE ALERT  Critical Value:  Lactic Acid 2.5  Date & Time Notied:  01/14/2019 [id number]  Provider Notified: On call [facility] provider. Baltazar Najjar  Orders Received/Actions taken: Awaiting further orders from provider. Will continue to monitor the patient

## 2019-01-14 NOTE — Evaluation (Signed)
Occupational Therapy Evaluation Patient Details Name: [name] MRN: [id number] DOB: [birth year] Today'Tran Date: 01/14/2019    History of Present Illness 69year old female admitted for acute pancreatitis.  PMH:  CHF/ICM, home 02, CAD, CABG, ICD, DM, HTN, OSA, and DVT   Clinical Impression   Pt was admitted for the above. At baseline, pt is able to toilet herself. She has a PCA who assists daily for 2 1/2 hours. She lives with her son, who works.  Will follow in acute setting focusing on toileting.      Follow Up Recommendations  Home health OT;SNF(depending upon progress; must be mod I with toileting)    Equipment Recommendations  (TBA ? wide 3:;1)    Recommendations for Other Services       Precautions / Restrictions Precautions Precautions: Fall Restrictions Weight Bearing Restrictions: No      Mobility Bed Mobility               General bed mobility comments: min A to get to EOB with HOB raised and use of rails  Transfers Overall transfer level: Needs assistance Equipment used: Rolling walker (2 wheeled) Transfers: Sit to/from [facility] Sit to Stand: Min assist Stand pivot transfers: Min assist       General transfer comment: min a to power up and steadying assistance.  Cues to turn completely for safety    Balance                                           ADL either performed or assessed with clinical judgement   ADL Overall ADL'Tran : Needs assistance/impaired Eating/Feeding: Independent   Grooming: Set up                   Toilet Transfer: Minimal assistance;Stand-pivot;BSC;RW   Toileting- Clothing Manipulation and Hygiene: Total assistance;Sit to/from stand         General ADL Comments: pt has assistance for adls at baseline.  Performed SPT to use BSC and another up to chair.  RN asked me to place heat packs at end of session, which I did     Vision         Perception     Praxis       Pertinent Vitals/Pain Pain Assessment: Faces Faces Pain Scale: Hurts even more Pain Location: back Pain Descriptors / Indicators: Aching Pain Intervention(Tran): Limited activity within patient'Tran tolerance;Monitored during session;Premedicated before session;Heat applied     Hand Dominance     Extremity/Trunk Assessment Upper Extremity Assessment Upper Extremity Assessment: Generalized weakness           Communication Communication Communication: No difficulties(didn't speak much)   Cognition Arousal/Alertness: Awake/alert Behavior During Therapy: Flat affect Overall Cognitive Status: No family/caregiver present to determine baseline cognitive functioning                                 General Comments: appears wfls; pt states she has a RW, records show rollator   General Comments  VSS    Exercises     Shoulder Instructions      Home Living Family/patient expects to be discharged to:: Private residence Living Arrangements: Children Available Help at Discharge: Family;Personal care attendant Type of Home: House Home Access: Stairs to enter [employer] of Steps: 3 Entrance Stairs-Rails: None  Home Layout: One level     Bathroom Shower/Tub: Teacher, early years/pre: Standard     Home Equipment: Walker - 2 wheels;Grab bars - toilet;Shower seat          Prior Functioning/Environment Level of Independence: Needs assistance        Comments: gets to bathroom on her own and manages toileting.  PCA 7 days a week for adls        OT Problem List: Decreased strength;Decreased activity tolerance;Impaired balance (sitting and/or standing);Decreased safety awareness;Pain      OT Treatment/Interventions: Self-care/ADL training;DME and/or AE instruction;Energy conservation;Therapeutic activities;Patient/family education;Balance training    OT Goals(Current goals can be found in the care plan section) Acute Rehab OT Goals Patient  Stated Goal: none stated; agreeable to OOB OT Goal Formulation: With patient/family Time For Goal Achievement: 01/28/19 Potential to Achieve Goals: Good ADL Goals Pt Will Transfer to Toilet: with supervision;ambulating;stand pivot transfer;bedside commode Pt Will Perform Toileting - Clothing Manipulation and hygiene: with supervision;sit to/from stand Additional ADL Goal #1: pt will perform bed mobility at supervision level in preparation for toileting  OT Frequency: Min 2X/week   Barriers to D/C:            Co-evaluation              AM-PAC OT "6 Clicks" Daily Activity     Outcome Measure Help from another person eating meals?: None Help from another person taking care of personal grooming?: A Little Help from another person toileting, which includes using toliet, bedpan, or urinal?: Total Help from another person bathing (including washing, rinsing, drying)?: A Lot Help from another person to put on and taking off regular upper body clothing?: A Lot Help from another person to put on and taking off regular lower body clothing?: Total 6 Click Score: 13   End of Session    Activity Tolerance: Patient tolerated treatment well Patient left: in chair;with call bell/phone within reach;with chair alarm set  OT Visit Diagnosis: Unsteadiness on feet (R26.81);Muscle weakness (generalized) (M62.81)                Time: [id number] OT Time Calculation (min): 35 min Charges:  OT General Charges $OT Visit: 1 Visit OT Evaluation $OT Eval Low Complexity: 1 Low OT Treatments $Self Care/Home Management : 8-22 mins  [name], OTR/L Acute Rehabilitation Services 01/14/2019  [name] 01/14/2019, 4:01 PM

## 2019-01-14 NOTE — Progress Notes (Addendum)
Progress Note    ASSESSMENT AND PLAN:    1. Acute pancreatitis, probably biliary in nature given cholelithiasis and abnormal liver tests. No bile duct dilation on CT scan. Not MRCP candidate. Liver tests improving, may have passed a stone. Should have cholecystectomy at some point. Pain better, got Morphine around 330a and 730a. Some nausea without vomiting. WBC elevated but has UTI also --Between fluid resuscitation and medication drips she got ~ 3 L yesterday. IVF now at 75 ml / hr. Original plan was to possibly increase IV fluid rate if CXR was okay as patient was tachypneic. However, CXR didn't get done. HCT better today 47 >> 43 but renal function got a little worse overnight. Continue current IV rate.  -She remains a little tachycardic despite BB, not hypotensive. No Afib / flutter or VT with interrogation of ICD.    -Home diuretics on hold --Taking sips of clears.  --am CMET, CBC --Continue supportive care.   2. UTI, GNR. Culture / sensitivities still pending. Got a dose of Rocephin yesterday but that was discontinued. Currently not on antibiotics   3. AKI on CKD. Cr 1.83, up from baseline of ~ 1.2  4. Elevated troponin, no chest pain. Cardiology following and feels likely demand ischemia but may get outpatient ischemic evaluation after resolution of acute issues.   5. Hx of DVT, home Xarelto on hold. On IV heparin    SUBJECTIVE    Pain is controlled. Some nausea, no vomiting. Tolerating sips   OBJECTIVE:     Vital signs in last 24 hours: Temp:  [97.7 F (36.5 C)-99.2 F (37.3 C)] 98.1 F (36.7 C) (01/06 0800) Pulse Rate:  [94-118] 102 (01/06 0400) Resp:  [17-36] 26 (01/06 0400) BP: (144-171)/(66-89) 171/84 (01/06 0400) SpO2:  [93 %-97 %] 97 % (01/06 0400) Last BM Date: 01/12/19 General:   Alert, in NAD Heart:  Sinus tachycardia, 2+ BLE edema   Pulm: Normal respiratory effort on 02  Abdomen:  Soft, nondistended, mild diffuse upper abdominal tenderness,  hypoactive bowel sounds.          Neurologic:  Alert and  oriented x4;  grossly normal neurologically. Psych:  Pleasant, cooperative.  Normal mood and affect.   Intake/Output from previous day: 01/05 0701 - 01/06 0700 In: 3050.1 [P.O.:240; I.V.:2311.7; IV Piggyback:498.4] Out: 300 [Urine:300] Intake/Output this shift: No intake/output data recorded.  Lab Results: Recent Labs    01/12/19 1857 01/13/19 0849 01/14/19 0140  WBC 13.4* 13.0* 14.2*  HGB 14.6 15.2* 13.7  HCT 45.2 46.9* 43.2  PLT 164 162 146*   BMET Recent Labs    01/13/19 0849 01/13/19 1044 01/14/19 0140  NA 138 138 137  K 5.4* 5.0 4.7  CL 107 106 107  CO2 18* 18* 19*  GLUCOSE 331* 340* 259*  BUN 31* 32* 39*  CREATININE 1.50* 1.68* 1.83*  CALCIUM 8.8* 8.6* 8.2*   LFT Recent Labs    01/13/19 0651 01/14/19 0140  PROT 7.3 6.8  ALBUMIN 3.1* 3.0*  AST 358* 153*  ALT 439* 265*  ALKPHOS 156* 131*  BILITOT 3.0* 2.5*  BILIDIR 1.9*  --   IBILI 1.1*  --    PT/INR Recent Labs    01/13/19 0651  LABPROT 19.7*  INR 1.7*   Hepatitis Panel No results for input(s): HEPBSAG, HCVAB, HEPAIGM, HEPBIGM in the last 72 hours.  CT ABDOMEN PELVIS WO CONTRAST  Result Date: 01/12/2019 CLINICAL DATA:  Nausea and vomiting. EXAM: CT ABDOMEN AND PELVIS WITHOUT CONTRAST  TECHNIQUE: Multidetector CT imaging of the abdomen and pelvis was performed following the standard protocol without IV contrast. COMPARISON:  May 26, 2014 FINDINGS: Lower chest: Multiple sternal wires are noted. Mild areas of atelectasis and/or infiltrate are seen within the bilateral lung bases. Hepatobiliary: No focal liver abnormality is seen. Numerous subcentimeter gallstones are seen within a mildly distended gallbladder. Pancreas: There is a moderate to marked amount of mesenteric inflammatory fat stranding within the upper abdomen. This involves the peripancreatic region and extends superiorly along the perigastric region. Inferior extension is seen  surrounding the descending colon. Spleen: Normal in size without focal abnormality. Adrenals/Urinary Tract: Adrenal glands are unremarkable. Kidneys are normal in size, without focal lesion or hydronephrosis. A 1.1 cm cluster of subcentimeter non-obstructing renal stones are seen within the mid right kidney. Bladder is unremarkable. Stomach/Bowel: There is a small hiatal hernia. Appendix appears normal. No evidence of bowel wall thickening or distention. Vascular/Lymphatic: There is marked severity aortic atherosclerosis. A stent is seen within the left common iliac vein. No enlarged abdominal or pelvic lymph nodes. Reproductive: The uterus is unremarkable. A 4.9 cm x 3.8 cm cystic appearing areas seen within the right adnexa. Other: A 3.9 cm x 2.9 cm fat containing periumbilical hernia is seen. Musculoskeletal: No acute or significant osseous findings. IMPRESSION: 1. Moderate to marked severity inflammatory process within the upper abdomen. This may be consistent acute pancreatitis, however, given the pattern of distribution of inflammatory fat stranding, associated inflammatory processes involving the stomach and/or descending colon cannot be excluded. 2. Cholelithiasis. 3. Right adnexal cyst, likely ovarian in origin. Electronically Signed   By: Virgina Norfolk M.D.   On: 01/12/2019 21:19   DG Chest Portable 1 View  Result Date: 01/12/2019 CLINICAL DATA:  Shortness of breath. Persistent vomiting. EXAM: PORTABLE CHEST 1 VIEW COMPARISON:  10/05/2015 FINDINGS: Heart size and pulmonary vascularity are within normal limits considering the AP portable technique. Aortic atherosclerosis. AICD. New slight bibasilar atelectasis. No consolidative infiltrates or effusions. No acute bone abnormality. CABG. IMPRESSION: New slight bibasilar atelectasis. Aortic Atherosclerosis (ICD10-I70.0). Electronically Signed   By: Lorriane Shire M.D.   On: 01/12/2019 18:34   ECHOCARDIOGRAM COMPLETE  Result Date: 01/13/2019    ECHOCARDIOGRAM REPORT   Patient Name:   [name] Date of Exam: 01/13/2019 Medical Rec #:  [id number]      Height:       63.0 in Accession #:    [id number]     Weight:       229.0 lb Date of Birth:  1950-11-04      BSA:          2.05 m Patient Age:    69 years       BP:           151/96 mmHg Patient Gender: F              HR:           116 bpm. Exam Location:  Inpatient Procedure: 2D Echo, Color Doppler, Cardiac Doppler and Intracardiac            Opacification Agent Indications:    Elevated Troponin  History:        Patient has prior history of Echocardiogram examinations, most                 recent 03/13/2015. CHF, Defibrillator and Prior CABG,                 Arrythmias:LBBB; Risk Factors:Dyslipidemia, Diabetes,  Hypertension and Sleep Apnea.  Sonographer:    Raquel Sarna Senior RDCS Referring Phys: [id number] Daune Perch  Sonographer Comments: Technically difficult study due to poor echo windows, patient is morbidly obese, no apical window and no subcostal window. Image acquisition challenging due to patient body habitus. IMPRESSIONS  1. Suboptimal image quality for diagnosis of biventricular function and wall motion.  2. Left ventricular ejection fraction, by visual estimation, is 50%. The left ventricle has limited views for evaluation of function. Possible apical wall motion abnormality. There is mildly increased left ventricular hypertrophy.  3. Global right ventricle was not well visualized.The right ventricular size is not well visualized. Right vetricular wall thickness was not assessed.  4. Left atrial size was not well visualized.  5. Right atrial size was not well visualized.  6. Mild mitral annular calcification.  7. The mitral valve is abnormal. No evidence of mitral valve regurgitation. No evidence of mitral stenosis.  8. The tricuspid valve is not well visualized.  9. The aortic valve was not well visualized. Aortic valve regurgitation is not visualized. No evidence of aortic valve  sclerosis or stenosis. FINDINGS  Left Ventricle: Left ventricular ejection fraction, by visual estimation, is 50%. The left ventricle has limited views for evaluation of function. The left ventricle is not well visualized. There is mildly increased left ventricular hypertrophy. Left ventricular diastolic parameters are indeterminate. Normal left atrial pressure. Right Ventricle: The right ventricular size is not well visualized. Right vetricular wall thickness was not assessed. Global RV systolic function is was not well visualized. Left Atrium: Left atrial size was not well visualized. Right Atrium: Right atrial size was not well visualized Pericardium: There is no evidence of pericardial effusion. Mitral Valve: The mitral valve is abnormal. Mild mitral annular calcification. No evidence of mitral valve regurgitation. No evidence of mitral valve stenosis by observation. Tricuspid Valve: The tricuspid valve is not well visualized. Tricuspid valve regurgitation is not demonstrated. Aortic Valve: The aortic valve was not well visualized. Aortic valve regurgitation is not visualized. The aortic valve is structurally normal, with no evidence of sclerosis or stenosis. Pulmonic Valve: The pulmonic valve was not well visualized. Pulmonic valve regurgitation is mild. Pulmonic regurgitation is mild. Aorta: The ascending aorta was not well visualized, the aortic root is normal in size and structure and the aortic arch was not well visualized. Venous: The inferior vena cava was not well visualized. IAS/Shunts: The interatrial septum was not assessed.  LEFT VENTRICLE PLAX 2D LVIDd:         4.90 cm LVIDs:         4.00 cm LV PW:         1.30 cm LV IVS:        1.30 cm LVOT diam:     2.00 cm LV SV:         43 ml LV SV Index:   19.43 LVOT Area:     3.14 cm  LEFT ATRIUM           Index LA diam:      3.70 cm 1.81 cm/m LA Vol (A4C): 58.5 ml 28.56 ml/m  AORTIC VALVE LVOT Vmax:   69.00 cm/s LVOT Vmean:  51.400 cm/s LVOT VTI:    0.095  m  AORTA Ao Root diam: 3.00 cm MITRAL VALVE MV Area (PHT): 6.83 cm              SHUNTS MV PHT:        32.19 msec  Systemic VTI:  0.10 m MV Decel Time: 111 msec              Systemic Diam: 2.00 cm MV E velocity: 127.00 cm/s 103 cm/s MV A velocity: 42.20 cm/s  70.3 cm/s MV E/A ratio:  3.01        1.5  Cherlynn Kaiser MD Electronically signed by Cherlynn Kaiser MD Signature Date/Time: 01/13/2019/8:56:38 PM    Final      Principal Problem:   Acute pancreatitis Active Problems:   S/P CABG x 5   Chronic combined systolic and diastolic heart failure (HCC)   AKI (acute kidney injury) ([place])   DVT (deep venous thrombosis) ([place])   DKA (diabetic ketoacidoses) ([place])     LOS: 2 days   [name] ,NP 01/14/2019, 9:11 AM    Attending physician's note   I have taken an interval history, reviewed the chart and examined the patient. I agree with the Advanced Practitioner's note, impression and recommendations.   Clinically improving, does not complain of any abdominal pain, mild tenderness on deep palpation of mid abdomen. Continue IV fluids Continue liquid diet as tolerated LFT continue to improve.  Cannot undergo MRCP due to MRI noncompatible ICD.  No plan for diagnostic ERCP at this point Acute on chronic kidney disease: Continue supportive care per hospitalist team  Currently heparin GTT, Xarelto on hold.  History of A. fib, CHF and DVT  GI will continue to follow  K. Denzil Magnuson , MD [phone]

## 2019-01-14 NOTE — Progress Notes (Addendum)
Progress Note  Patient Name: [name] Date of Encounter: 01/14/2019  Primary Cardiologist: Candee Furbish, MD   Subjective   No acute overnight events. Patient feeling a little better today. Biggest complaint this morning is chronic back pain. No chest pain or shortness of breath. Abdominal pain as improved. Tolerating liquids right now.  Inpatient Medications    Scheduled Meds: . Chlorhexidine Gluconate Cloth  6 each Topical Daily  . insulin aspart  0-5 Units Subcutaneous QHS  . insulin aspart  0-9 Units Subcutaneous TID WC  . insulin glargine  20 Units Subcutaneous Daily  . latanoprost  1 drop Both Eyes QHS  . mouth rinse  15 mL Mouth Rinse BID  . metoprolol tartrate  5 mg Intravenous Q8H   Continuous Infusions: . sodium chloride Stopped (01/14/19 0027)  . heparin 1,050 Units/hr (01/14/19 0600)   PRN Meds: albuterol, dextrose, morphine injection, ondansetron (ZOFRAN) IV   Vital Signs    Vitals:   01/13/19 2000 01/13/19 2227 01/14/19 0000 01/14/19 0400  BP: (!) 146/72  (!) 153/66 (!) 171/84  Pulse: (!) 106 99 (!) 102 (!) 102  Resp: (!) 35 (!) 29 (!) 31 (!) 26  Temp: 98.2 F (36.8 C)  98.3 F (36.8 C) 97.7 F (36.5 C)  TempSrc: Oral  Oral Oral  SpO2: 96% 96% 97% 97%  Weight:      Height:        Intake/Output Summary (Last 24 hours) at 01/14/2019 0830 Last data filed at 01/14/2019 0600 Gross per 24 hour  Intake 3050.06 ml  Output 300 ml  Net 2750.06 ml   Last 3 Weights 01/12/2019 12/30/2018 12/09/2018  Weight (lbs) 229 lb 229 lb 234 lb  Weight (kg) 103.874 kg 103.874 kg 106.142 kg      Telemetry    Normal sinus rhythm with frequent PVCs/PACs. Rate in the 80's to low 100's. - Personally Reviewed  ECG    No new tracing today. - Personally Reviewed  Physical Exam   GEN: Obese African-American female in no acute distress.   Neck: Supple. No significant JVD appreciated. Cardiac: RRR. No murmurs, rubs, or gallops.  Respiratory: Possible mild faint  crackles but lungs mostly clear. GI: Soft, non-distended, but mildly tender to palpation of upper abdomen. Bowel sounds present. MS: No to trace lower extremity edema; No deformity. Skin: Warm and dry. Neuro:  Nonfocal  Psych: Normal affect   Labs    High Sensitivity Troponin:   Recent Labs  Lab 01/12/19 1857 01/13/19 0651 01/13/19 0849 01/13/19 1044 01/13/19 1356  TROPONINIHS 86* 688* 843* 1,023* 1,023*      Chemistry Recent Labs  Lab 01/12/19 1857 01/13/19 0651 01/13/19 0849 01/13/19 1044 01/14/19 0140  NA 135 137 138 138 137  K 5.5* 5.2* 5.4* 5.0 4.7  CL 100 106 107 106 107  CO2 19* 17* 18* 18* 19*  GLUCOSE 490* 294* 331* 340* 259*  BUN 33* 31* 31* 32* 39*  CREATININE 1.70* 1.54* 1.50* 1.68* 1.83*  CALCIUM 9.3 8.8* 8.8* 8.6* 8.2*  PROT 7.8 7.3  --   --  6.8  ALBUMIN 3.5 3.1*  --   --  3.0*  AST 819* 358*  --   --  153*  ALT 589* 439*  --   --  265*  ALKPHOS 170* 156*  --   --  131*  BILITOT 2.1* 3.0*  --   --  2.5*  GFRNONAA 30* 34* 35* 31* 28*  GFRAA 35* 40* 41* 36* 32*  ANIONGAP 16* 14 13 14 11      Hematology Recent Labs  Lab 01/12/19 1857 01/13/19 0849 01/14/19 0140  WBC 13.4* 13.0* 14.2*  RBC 4.46 4.70 4.25  HGB 14.6 15.2* 13.7  HCT 45.2 46.9* 43.2  MCV 101.3* 99.8 101.6*  MCH 32.7 32.3 32.2  MCHC 32.3 32.4 31.7  RDW 13.5 13.5 13.6  PLT 164 162 146*    BNP Recent Labs  Lab 01/12/19 1857  BNP 88.3     DDimer No results for input(s): DDIMER in the last 168 hours.   Radiology    CT ABDOMEN PELVIS WO CONTRAST  Result Date: 01/12/2019 CLINICAL DATA:  Nausea and vomiting. EXAM: CT ABDOMEN AND PELVIS WITHOUT CONTRAST TECHNIQUE: Multidetector CT imaging of the abdomen and pelvis was performed following the standard protocol without IV contrast. COMPARISON:  May 26, 2014 FINDINGS: Lower chest: Multiple sternal wires are noted. Mild areas of atelectasis and/or infiltrate are seen within the bilateral lung bases. Hepatobiliary: No focal liver  abnormality is seen. Numerous subcentimeter gallstones are seen within a mildly distended gallbladder. Pancreas: There is a moderate to marked amount of mesenteric inflammatory fat stranding within the upper abdomen. This involves the peripancreatic region and extends superiorly along the perigastric region. Inferior extension is seen surrounding the descending colon. Spleen: Normal in size without focal abnormality. Adrenals/Urinary Tract: Adrenal glands are unremarkable. Kidneys are normal in size, without focal lesion or hydronephrosis. A 1.1 cm cluster of subcentimeter non-obstructing renal stones are seen within the mid right kidney. Bladder is unremarkable. Stomach/Bowel: There is a small hiatal hernia. Appendix appears normal. No evidence of bowel wall thickening or distention. Vascular/Lymphatic: There is marked severity aortic atherosclerosis. A stent is seen within the left common iliac vein. No enlarged abdominal or pelvic lymph nodes. Reproductive: The uterus is unremarkable. A 4.9 cm x 3.8 cm cystic appearing areas seen within the right adnexa. Other: A 3.9 cm x 2.9 cm fat containing periumbilical hernia is seen. Musculoskeletal: No acute or significant osseous findings. IMPRESSION: 1. Moderate to marked severity inflammatory process within the upper abdomen. This may be consistent acute pancreatitis, however, given the pattern of distribution of inflammatory fat stranding, associated inflammatory processes involving the stomach and/or descending colon cannot be excluded. 2. Cholelithiasis. 3. Right adnexal cyst, likely ovarian in origin. Electronically Signed   By: Virgina Norfolk M.D.   On: 01/12/2019 21:19   DG Chest Portable 1 View  Result Date: 01/12/2019 CLINICAL DATA:  Shortness of breath. Persistent vomiting. EXAM: PORTABLE CHEST 1 VIEW COMPARISON:  10/05/2015 FINDINGS: Heart size and pulmonary vascularity are within normal limits considering the AP portable technique. Aortic  atherosclerosis. AICD. New slight bibasilar atelectasis. No consolidative infiltrates or effusions. No acute bone abnormality. CABG. IMPRESSION: New slight bibasilar atelectasis. Aortic Atherosclerosis (ICD10-I70.0). Electronically Signed   By: Lorriane Shire M.D.   On: 01/12/2019 18:34   ECHOCARDIOGRAM COMPLETE  Result Date: 01/13/2019   ECHOCARDIOGRAM REPORT   Patient Name:   [name] Date of Exam: 01/13/2019 Medical Rec #:  [id number]      Height:       63.0 in Accession #:    [id number]     Weight:       229.0 lb Date of Birth:  27-Jan-1950      BSA:          2.05 m Patient Age:    4 years       BP:           151/96  mmHg Patient Gender: F              HR:           116 bpm. Exam Location:  Inpatient Procedure: 2D Echo, Color Doppler, Cardiac Doppler and Intracardiac            Opacification Agent Indications:    Elevated Troponin  History:        Patient has prior history of Echocardiogram examinations, most                 recent 03/13/2015. CHF, Defibrillator and Prior CABG,                 Arrythmias:LBBB; Risk Factors:Dyslipidemia, Diabetes,                 Hypertension and Sleep Apnea.  Sonographer:    Raquel Sarna Senior RDCS Referring Phys: [id number] Daune Perch  Sonographer Comments: Technically difficult study due to poor echo windows, patient is morbidly obese, no apical window and no subcostal window. Image acquisition challenging due to patient body habitus. IMPRESSIONS  1. Suboptimal image quality for diagnosis of biventricular function and wall motion.  2. Left ventricular ejection fraction, by visual estimation, is 50%. The left ventricle has limited views for evaluation of function. Possible apical wall motion abnormality. There is mildly increased left ventricular hypertrophy.  3. Global right ventricle was not well visualized.The right ventricular size is not well visualized. Right vetricular wall thickness was not assessed.  4. Left atrial size was not well visualized.  5. Right atrial size was  not well visualized.  6. Mild mitral annular calcification.  7. The mitral valve is abnormal. No evidence of mitral valve regurgitation. No evidence of mitral stenosis.  8. The tricuspid valve is not well visualized.  9. The aortic valve was not well visualized. Aortic valve regurgitation is not visualized. No evidence of aortic valve sclerosis or stenosis. FINDINGS  Left Ventricle: Left ventricular ejection fraction, by visual estimation, is 50%. The left ventricle has limited views for evaluation of function. The left ventricle is not well visualized. There is mildly increased left ventricular hypertrophy. Left ventricular diastolic parameters are indeterminate. Normal left atrial pressure. Right Ventricle: The right ventricular size is not well visualized. Right vetricular wall thickness was not assessed. Global RV systolic function is was not well visualized. Left Atrium: Left atrial size was not well visualized. Right Atrium: Right atrial size was not well visualized Pericardium: There is no evidence of pericardial effusion. Mitral Valve: The mitral valve is abnormal. Mild mitral annular calcification. No evidence of mitral valve regurgitation. No evidence of mitral valve stenosis by observation. Tricuspid Valve: The tricuspid valve is not well visualized. Tricuspid valve regurgitation is not demonstrated. Aortic Valve: The aortic valve was not well visualized. Aortic valve regurgitation is not visualized. The aortic valve is structurally normal, with no evidence of sclerosis or stenosis. Pulmonic Valve: The pulmonic valve was not well visualized. Pulmonic valve regurgitation is mild. Pulmonic regurgitation is mild. Aorta: The ascending aorta was not well visualized, the aortic root is normal in size and structure and the aortic arch was not well visualized. Venous: The inferior vena cava was not well visualized. IAS/Shunts: The interatrial septum was not assessed.  LEFT VENTRICLE PLAX 2D LVIDd:         4.90 cm  LVIDs:         4.00 cm LV PW:         1.30 cm LV IVS:  1.30 cm LVOT diam:     2.00 cm LV SV:         43 ml LV SV Index:   19.43 LVOT Area:     3.14 cm  LEFT ATRIUM           Index LA diam:      3.70 cm 1.81 cm/m LA Vol (A4C): 58.5 ml 28.56 ml/m  AORTIC VALVE LVOT Vmax:   69.00 cm/s LVOT Vmean:  51.400 cm/s LVOT VTI:    0.095 m  AORTA Ao Root diam: 3.00 cm MITRAL VALVE MV Area (PHT): 6.83 cm              SHUNTS MV PHT:        32.19 msec            Systemic VTI:  0.10 m MV Decel Time: 111 msec              Systemic Diam: 2.00 cm MV E velocity: 127.00 cm/s 103 cm/s MV A velocity: 42.20 cm/s  70.3 cm/s MV E/A ratio:  3.01        1.5  Cherlynn Kaiser MD Electronically signed by Cherlynn Kaiser MD Signature Date/Time: 01/13/2019/8:56:38 PM    Final     Cardiac Studies   Echocardiogram 01/13/2019: Impressions:  1. Suboptimal image quality for diagnosis of biventricular function and wall motion.  2. Left ventricular ejection fraction, by visual estimation, is 50%. The left ventricle has limited views for evaluation of function. Possible apical wall motion abnormality. There is mildly increased left ventricular hypertrophy.  3. Global right ventricle was not well visualized.The right ventricular size is not well visualized. Right vetricular wall thickness was not assessed.  4. Left atrial size was not well visualized.  5. Right atrial size was not well visualized.  6. Mild mitral annular calcification.  7. The mitral valve is abnormal. No evidence of mitral valve regurgitation. No evidence of mitral stenosis.  8. The tricuspid valve is not well visualized.  9. The aortic valve was not well visualized. Aortic valve regurgitation is not visualized. No evidence of aortic valve sclerosis or stenosis.  Patient Profile   Ms. Lasko is a 68 y.o. female with a history of CAD s/p CABG [id number], chronic systolic CHF/ischemic cardiomyopathy, ventricular tachycardia s/p ICD, diabetes mellitus, hypertension, history  of DVT 09/2015 now on anticoagulation, OSA who is being seen today for the evaluation of elevated troponin at the request of Dr. Tawanna Solo.   Assessment & Plan    Elevated Troponin with Known CAD s/p CABG in 2007 - Patient presented with abdominal pain due to acute pancreatitis and found to have elevated troponin.  - High-sensitivity troponin elevated at 688 >> 843 >> 1,023 >> 1,023. - Echo shows LVEF of 50% with possible apical wall motion abnormality. - No chest pain.  - Likely demand ischemia and can plan for outpatient ischemia evaluation once her acute issues have resolved.  - Continue IV Heparin for DVT prophylaxis for now.   Sinus Rhythm vs. Atrial Flutter - EKG and telemetry are reading as sinus rhythm but there was initially some concern for atrial flutter. However, [facility] rep interrogated device this morning which showed sinus rhythm with no signs of atrial fibrillation/flutter or VT.  - Rates in the 80's to low 100's on telemetry. - Continue IV Lopressor 5mg  every 8 hours until able to resume PO medications.  Chronic Combine CHF  - Echo as above. - BNP normal at 88 on admission. -  Home Lasix and Spironolactone currently on hold as she is receiving IV fluids given acute pancreatitis. Currently on maintenance fluids at 75 mL/hr. - No significant signs of volume overload on exam but device interrogation showed drop in impedence signifying increase in volume. Will need to monitor volume status closely with this. May need to consider decreasing rate of maintenance fluids. - Home Coreg and Imdur on hold while NPO. Restart when able.  - Continue to monitor daily weights, strict I/O's, and renal function.   History of VT s/p ICD  - Not an active issue.  - Device interrogation showed no VT.  - Will give IV metoprolol given that she is NPO.   Hypertension  - BP 171/84. - May need to increase IV Lopressor to every 6 hours or add IV Hydralazine. - Can gradually resume home  medications (Coreg,, Lisinopril, Spironolactone, Imdur) when able to take PO medications again.  Hyperlipidemia  - Restart home Crestor when able to take oral medications.    Prior DVT  - Xarelto on hold. Continue Heparin.   Acute Pancreatitis  - WBC 14.2 today, up from 13.0 yesterday. - Lactic acid 2.5. - Management per primary team.   For questions or updates, please contact [facility] Please consult [email or web address] for contact info under        Signed, Darreld Mclean, PA-C  01/14/2019, 8:30 AM

## 2019-01-14 NOTE — Plan of Care (Signed)

## 2019-01-14 NOTE — Progress Notes (Signed)
PROGRESS NOTE    [name]  [id number] DOB: 1950/04/24 DOA: 01/12/2019 PCP: Everardo Beals, NP   Brief Narrative:  Patient is a 69 year old female with history of coronary disease status post CABG, chronic systolic heart failure status post AICD, history of V. tach, diabetes type 2, chronic kidney disease stage III, history of DVT on anticoagulation who presents to the emergency department with complaints of worsening abdominal pain.  She was complaining  of generalized abdominal pain mostly in the epigastric area with nausea and vomiting since last 3 weeks.  She was not able to tolerate food or medications by mouth.  Pain worsened over 24 to 48 hours.  On presentation she was afebrile.  She was found to have elevated liver enzymes, elevated lipase.  Elevated troponin.  EKG showed nonspecific ST changes.  Denies any chest pain.  CT abdomen/pelvis was concerning for acute pancreatitis.  Had elevated anion gap, low CO2 concerning for DKA.  Patient was admitted for the management of pancreatitis, possible NSTEMI, DKA, UTI.  Cardiology and GI following.  Currently on heparin drip.  01/14/19: Patients feel better today.  Her abdomen pain has improved.  We stopped insulin drip t and started on Lantus and sliding scale.  Her troponin is elevated most likely due to supply demand ischemia.  Echocardiogram showed ejection fraction of 50%, possible apical wall motion abnormality.  She does not complain of any chest pain today.  Heparin drip to be continued. Will request PT/OT.  Assessment & Plan:   Principal Problem:   Acute pancreatitis Active Problems:   S/P CABG x 5   Chronic combined systolic and diastolic heart failure (HCC)   AKI (acute kidney injury) ([place])   DVT (deep venous thrombosis) (HCC)   DKA (diabetic ketoacidoses) (HCC)   Acute pancreatitis: Most likely gallstone pancreatitis.  Presented with epigastric abdominal pain.  Pain is better this morning.  Currently on clear liquid  diet.  GI following.  CT abdomen/pelvis showed features of pancreatitis,gallstones.  Elevated liver enzymes.  Elevated lipase.  Not a candidate for ERCP as per GI.  Will involve general surgery at some point for possible cholecystectomy. Continue to monitor liver enzymes, lipase.  Elevated troponin: Significant elevated troponin.  Denies any  chest pain.  Nonspecific changes in the EKG.  Echocardiogram  showed ejection fraction of 50%, possible apical wall motion abnormality.  Cardiology closely following.  Currently on heparin drip. She has history of coronary artery disease and is status post CABG.  Suspected DKA: Low CO2, elevated anion gap on presentation .  History of diabetes type 2.  On insulin at home.  Off insulin drip now.  Started on Lantus and sliding scale.  Continue monitoring sugars  Chronic combined  CHF: Currently euvolemic or dehydrated .  Has AICD.  Pacemaker interrogated without finding of VT.Marland Kitchen  Echocardiogram as above . Cardiology following.  She is on carvedilol, Lasix, Imdur, lisinopril, statin, spironolactone  Acute on CKD stage III: Secondary to vomiting, poor oral intake.  Continue to monitor.  Continue IV fluids.Monitor kidney function  Suspected UTI:  UA was suggestive of UTI.  Urine culture showing proteus .  Denies dysuria.  Will restart ceftraixone.  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  On Xarelto at home.  On heparin drip currently.  Lactic acidosis: Secondary to dehydration.  Lactate level improving.  Low suspicion for infectious etiology.  AKI on CKD stage IIIa: Continue gentle IV fluids.  Check CMP tomorrow  DVT prophylaxis: IV heparin Code Status: Full code Family Communication: Called and discussed with daughter on phone on 01/13/19 Disposition Plan: Likely home after clinically stability.Needs PT assessment    Consultants: GI, cardiology  Procedures: None  Antimicrobials:  Anti-infectives (From admission,  onward)   Start     Dose/Rate Route Frequency Ordered Stop   01/13/19 2200  cefTRIAXone (ROCEPHIN) 2 g in sodium chloride 0.9 % 100 mL IVPB  Status:  Discontinued     2 g 200 mL/hr over 30 Minutes Intravenous Every 24 hours 01/12/19 2338 01/13/19 1400   01/12/19 2345  metroNIDAZOLE (FLAGYL) IVPB 500 mg  Status:  Discontinued     500 mg 100 mL/hr over 60 Minutes Intravenous Every 8 hours 01/12/19 2338 01/13/19 1400   01/12/19 2300  cefTRIAXone (ROCEPHIN) 2 g in sodium chloride 0.9 % 100 mL IVPB     2 g 200 mL/hr over 30 Minutes Intravenous  Once 01/12/19 2247 01/13/19 0023   01/12/19 2245  cefTRIAXone (ROCEPHIN) 1 g in sodium chloride 0.9 % 100 mL IVPB  Status:  Discontinued     1 g 200 mL/hr over 30 Minutes Intravenous  Once 01/12/19 2242 01/12/19 2247      Subjective:  Patient seen and examined the bedside this morning.  Hemodynamically stable.  Abdominal pain is better.  She feels better.  Denies any severe abdominal pain, denies any chest pain.  Respiratory status stable.  Objective: Vitals:   01/14/19 0700 01/14/19 0800 01/14/19 0900 01/14/19 1000  BP:  (!) 150/89  123/84  Pulse: 88 94 97 (!) 102  Resp: (!) 32 (!) 30 (!) 25 (!) 29  Temp:  98.1 F (36.7 C)    TempSrc:  Oral    SpO2: 97% 97% 96% 98%  Weight:      Height:        Intake/Output Summary (Last 24 hours) at 01/14/2019 1200 Last data filed at 01/14/2019 1000 Gross per 24 hour  Intake 3379.07 ml  Output 300 ml  Net 3079.07 ml   Filed Weights   01/12/19 1740  Weight: 103.9 kg    Examination:  General exam: Not in distress, morbidly obese HEENT:PERRL,Oral mucosa moist, Ear/Nose normal on gross exam Respiratory system: Bilateral equal air entry, normal vesicular breath sounds, no wheezes or crackles  Cardiovascular system: Sinus tachycardia, RRR. No JVD, murmurs, rubs, gallops or clicks. No pedal edema. Gastrointestinal system: Abdomen is nondistended, soft and has mild tenderness in the epigastric region. No  organomegaly or masses felt. Normal bowel sounds heard. Central nervous system: Alert and oriented. No focal neurological deficits. Extremities: No edema, no clubbing ,no cyanosis, distal peripheral pulses palpable. Skin: No rashes, lesions or ulcers,no icterus ,no pallor .     Data Reviewed: I have personally reviewed following labs and imaging studies  CBC: Recent Labs  Lab 01/12/19 1857 01/13/19 0849 01/14/19 0140  WBC 13.4* 13.0* 14.2*  NEUTROABS 12.3*  --  11.6*  HGB 14.6 15.2* 13.7  HCT 45.2 46.9* 43.2  MCV 101.3* 99.8 101.6*  PLT 164 162 [id number]*   Basic Metabolic Panel: Recent Labs  Lab 01/13/19 0341 01/13/19 0651 01/13/19 0849 01/13/19 1044 01/14/19 0140  NA 138 137 138 138 137  K 4.5 5.2* 5.4* 5.0 4.7  CL 107 106 107 106 107  CO2 19* 17* 18* 18* 19*  GLUCOSE 262* 294* 331* 340* 259*  BUN 30* 31* 31* 32* 39*  CREATININE 1.54* 1.54* 1.50* 1.68* 1.83*  CALCIUM 8.9 8.8* 8.8* 8.6* 8.2*  GFR: Estimated Creatinine Clearance: 33.9 mL/min (A) (by C-G formula based on SCr of 1.83 mg/dL (H)). Liver Function Tests: Recent Labs  Lab 01/12/19 1857 01/13/19 0651 01/14/19 0140  AST 819* 358* 153*  ALT 589* 439* 265*  ALKPHOS 170* 156* 131*  BILITOT 2.1* 3.0* 2.5*  PROT 7.8 7.3 6.8  ALBUMIN 3.5 3.1* 3.0*   Recent Labs  Lab 01/12/19 1857  LIPASE 4,497*   No results for input(s): AMMONIA in the last 168 hours. Coagulation Profile: Recent Labs  Lab 01/13/19 0651  INR 1.7*   Cardiac Enzymes: No results for input(s): CKTOTAL, CKMB, CKMBINDEX, TROPONINI in the last 168 hours. BNP (last 3 results) No results for input(s): PROBNP in the last 8760 hours. HbA1C: Recent Labs    01/13/19 0341  HGBA1C 8.3*   CBG: Recent Labs  Lab 01/14/19 0522 01/14/19 0618 01/14/19 0726 01/14/19 0854 01/14/19 0921  GLUCAP 224* 233* 191* 219* 233*   Lipid Profile: No results for input(s): CHOL, HDL, LDLCALC, TRIG, CHOLHDL, LDLDIRECT in the last 72 hours. Thyroid  Function Tests: No results for input(s): TSH, T4TOTAL, FREET4, T3FREE, THYROIDAB in the last 72 hours. Anemia Panel: No results for input(s): VITAMINB12, FOLATE, FERRITIN, TIBC, IRON, RETICCTPCT in the last 72 hours. Sepsis Labs: Recent Labs  Lab 01/12/19 1857 01/13/19 0341 01/14/19 0140  LATICACIDVEN 6.1* 2.9* 2.5*    Recent Results (from the past 240 hour(s))  SARS CORONAVIRUS 2 (TAT 6-24 HRS) Nasopharyngeal Nasopharyngeal Swab     Status: None   Collection Time: 01/12/19  8:45 PM   Specimen: Nasopharyngeal Swab  Result Value Ref Range Status   SARS Coronavirus 2 NEGATIVE NEGATIVE Final    Comment: (NOTE) SARS-CoV-2 target nucleic acids are NOT DETECTED. The SARS-CoV-2 RNA is generally detectable in upper and lower respiratory specimens during the acute phase of infection. Negative results do not preclude SARS-CoV-2 infection, do not rule out co-infections with other pathogens, and should not be used as the sole basis for treatment or other patient management decisions. Negative results must be combined with clinical observations, patient history, and epidemiological information. The expected result is Negative. Fact Sheet for Patients: SugarRoll.be Fact Sheet for Healthcare Providers: https://www.woods-mathews.com/ This test is not yet approved or cleared by the [place] FDA and  has been authorized for detection and/or diagnosis of SARS-CoV-2 by FDA under an Emergency Use Authorization (EUA). This EUA will remain  in effect (meaning this test can be used) for the duration of the COVID-19 declaration under Section 56 4(b)(1) of the Act, 21 U.S.C. section 360bbb-3(b)(1), unless the authorization is terminated or revoked sooner. Performed at [facility], [place] [address], [place], [place] [address]   Urine culture     Status: Abnormal (Preliminary result)   Collection Time: 01/12/19 10:43 PM   Specimen: Urine, Clean Catch   Result Value Ref Range Status   Specimen Description   Final    URINE, CLEAN CATCH Performed at [facility], [place] [address], [place], [place] [address]    Special Requests   Final    NONE Performed at [facility], [place] [address] [place], [place] [address]    Culture (A)  Final    >=100,000 COLONIES/mL PROTEUS MIRABILIS SUSCEPTIBILITIES TO FOLLOW Performed at [facility], [place] [address], [place], [place] [address]    Report Status PENDING  Incomplete  MRSA PCR Screening     Status: None   Collection Time: 01/13/19  5:43 PM   Specimen:  Nasopharyngeal  Result Value Ref Range Status   MRSA by PCR NEGATIVE NEGATIVE Final    Comment:        The GeneXpert MRSA Assay (FDA approved for NASAL specimens only), is one component of a comprehensive MRSA colonization surveillance program. It is not intended to diagnose MRSA infection nor to guide or monitor treatment for MRSA infections. Performed at [facility], [place] [address], [place], [place] [address]          Radiology Studies: CT ABDOMEN PELVIS WO CONTRAST  Result Date: 01/12/2019 CLINICAL DATA:  Nausea and vomiting. EXAM: CT ABDOMEN AND PELVIS WITHOUT CONTRAST TECHNIQUE: Multidetector CT imaging of the abdomen and pelvis was performed following the standard protocol without IV contrast. COMPARISON:  May 26, 2014 FINDINGS: Lower chest: Multiple sternal wires are noted. Mild areas of atelectasis and/or infiltrate are seen within the bilateral lung bases. Hepatobiliary: No focal liver abnormality is seen. Numerous subcentimeter gallstones are seen within a mildly distended gallbladder. Pancreas: There is a moderate to marked amount of mesenteric inflammatory fat stranding within the upper abdomen. This involves the peripancreatic region and extends superiorly along the perigastric region. Inferior extension is seen surrounding the descending colon. Spleen: Normal in  size without focal abnormality. Adrenals/Urinary Tract: Adrenal glands are unremarkable. Kidneys are normal in size, without focal lesion or hydronephrosis. A 1.1 cm cluster of subcentimeter non-obstructing renal stones are seen within the mid right kidney. Bladder is unremarkable. Stomach/Bowel: There is a small hiatal hernia. Appendix appears normal. No evidence of bowel wall thickening or distention. Vascular/Lymphatic: There is marked severity aortic atherosclerosis. A stent is seen within the left common iliac vein. No enlarged abdominal or pelvic lymph nodes. Reproductive: The uterus is unremarkable. A 4.9 cm x 3.8 cm cystic appearing areas seen within the right adnexa. Other: A 3.9 cm x 2.9 cm fat containing periumbilical hernia is seen. Musculoskeletal: No acute or significant osseous findings. IMPRESSION: 1. Moderate to marked severity inflammatory process within the upper abdomen. This may be consistent acute pancreatitis, however, given the pattern of distribution of inflammatory fat stranding, associated inflammatory processes involving the stomach and/or descending colon cannot be excluded. 2. Cholelithiasis. 3. Right adnexal cyst, likely ovarian in origin. Electronically Signed   By: Virgina Norfolk M.D.   On: 01/12/2019 21:19   DG Chest Portable 1 View  Result Date: 01/12/2019 CLINICAL DATA:  Shortness of breath. Persistent vomiting. EXAM: PORTABLE CHEST 1 VIEW COMPARISON:  10/05/2015 FINDINGS: Heart size and pulmonary vascularity are within normal limits considering the AP portable technique. Aortic atherosclerosis. AICD. New slight bibasilar atelectasis. No consolidative infiltrates or effusions. No acute bone abnormality. CABG. IMPRESSION: New slight bibasilar atelectasis. Aortic Atherosclerosis (ICD10-I70.0). Electronically Signed   By: Lorriane Shire M.D.   On: 01/12/2019 18:34   ECHOCARDIOGRAM COMPLETE  Result Date: 01/13/2019   ECHOCARDIOGRAM REPORT   Patient Name:   KASSY PONTECORVO Date  of Exam: 01/13/2019 Medical Rec #:  [id number]      Height:       63.0 in Accession #:    [id number]     Weight:       229.0 lb Date of Birth:  [birth year]      BSA:          2.05 m Patient Age:    80 years       BP:           151/96 mmHg Patient Gender: F  HR:           116 bpm. Exam Location:  Inpatient Procedure: 2D Echo, Color Doppler, Cardiac Doppler and Intracardiac            Opacification Agent Indications:    Elevated Troponin  History:        Patient has prior history of Echocardiogram examinations, most                 recent 03/13/2015. CHF, Defibrillator and Prior CABG,                 Arrythmias:LBBB; Risk Factors:Dyslipidemia, Diabetes,                 Hypertension and Sleep Apnea.  Sonographer:    Raquel Sarna Senior RDCS Referring Phys: [id number] Daune Perch  Sonographer Comments: Technically difficult study due to poor echo windows, patient is morbidly obese, no apical window and no subcostal window. Image acquisition challenging due to patient body habitus. IMPRESSIONS  1. Suboptimal image quality for diagnosis of biventricular function and wall motion.  2. Left ventricular ejection fraction, by visual estimation, is 50%. The left ventricle has limited views for evaluation of function. Possible apical wall motion abnormality. There is mildly increased left ventricular hypertrophy.  3. Global right ventricle was not well visualized.The right ventricular size is not well visualized. Right vetricular wall thickness was not assessed.  4. Left atrial size was not well visualized.  5. Right atrial size was not well visualized.  6. Mild mitral annular calcification.  7. The mitral valve is abnormal. No evidence of mitral valve regurgitation. No evidence of mitral stenosis.  8. The tricuspid valve is not well visualized.  9. The aortic valve was not well visualized. Aortic valve regurgitation is not visualized. No evidence of aortic valve sclerosis or stenosis. FINDINGS  Left Ventricle: Left ventricular  ejection fraction, by visual estimation, is 50%. The left ventricle has limited views for evaluation of function. The left ventricle is not well visualized. There is mildly increased left ventricular hypertrophy. Left ventricular diastolic parameters are indeterminate. Normal left atrial pressure. Right Ventricle: The right ventricular size is not well visualized. Right vetricular wall thickness was not assessed. Global RV systolic function is was not well visualized. Left Atrium: Left atrial size was not well visualized. Right Atrium: Right atrial size was not well visualized Pericardium: There is no evidence of pericardial effusion. Mitral Valve: The mitral valve is abnormal. Mild mitral annular calcification. No evidence of mitral valve regurgitation. No evidence of mitral valve stenosis by observation. Tricuspid Valve: The tricuspid valve is not well visualized. Tricuspid valve regurgitation is not demonstrated. Aortic Valve: The aortic valve was not well visualized. Aortic valve regurgitation is not visualized. The aortic valve is structurally normal, with no evidence of sclerosis or stenosis. Pulmonic Valve: The pulmonic valve was not well visualized. Pulmonic valve regurgitation is mild. Pulmonic regurgitation is mild. Aorta: The ascending aorta was not well visualized, the aortic root is normal in size and structure and the aortic arch was not well visualized. Venous: The inferior vena cava was not well visualized. IAS/Shunts: The interatrial septum was not assessed.  LEFT VENTRICLE PLAX 2D LVIDd:         4.90 cm LVIDs:         4.00 cm LV PW:         1.30 cm LV IVS:        1.30 cm LVOT diam:     2.00 cm LV SV:  43 ml LV SV Index:   19.43 LVOT Area:     3.14 cm  LEFT ATRIUM           Index LA diam:      3.70 cm 1.81 cm/m LA Vol (A4C): 58.5 ml 28.56 ml/m  AORTIC VALVE LVOT Vmax:   69.00 cm/s LVOT Vmean:  51.400 cm/s LVOT VTI:    0.095 m  AORTA Ao Root diam: 3.00 cm MITRAL VALVE MV Area (PHT): 6.83  cm              SHUNTS MV PHT:        32.19 msec            Systemic VTI:  0.10 m MV Decel Time: 111 msec              Systemic Diam: 2.00 cm MV E velocity: 127.00 cm/s 103 cm/s MV A velocity: 42.20 cm/s  70.3 cm/s MV E/A ratio:  3.01        1.5  Cherlynn Kaiser MD Electronically signed by Cherlynn Kaiser MD Signature Date/Time: 01/13/2019/8:56:38 PM    Final         Scheduled Meds: . Chlorhexidine Gluconate Cloth  6 each Topical Daily  . insulin aspart  0-5 Units Subcutaneous QHS  . insulin aspart  0-9 Units Subcutaneous TID WC  . insulin glargine  20 Units Subcutaneous Daily  . latanoprost  1 drop Both Eyes QHS  . mouth rinse  15 mL Mouth Rinse BID  . metoprolol tartrate  5 mg Intravenous Q8H   Continuous Infusions: . sodium chloride Stopped (01/14/19 0027)  . heparin 1,050 Units/hr (01/14/19 1000)     LOS: 2 days    Time spent:35 mins, More than 50% of that time was spent in counseling and/or coordination of care.      Shelly Coss, MD [facility] Pager [phone]  If 7PM-7AM, please contact night-coverage [email or web address] Password [email or web address] 01/14/2019, 12:00 PM

## 2019-01-14 NOTE — Progress Notes (Addendum)
[place] for IV heparin Indication: Hx DVT  Allergies  Allergen Reactions  . Lipitor  [Atorvastatin Calcium] Rash  . Metformin And Related Itching, Swelling and Other (See Comments)    Leg pain & swelling in legs  . Atorvastatin Itching and Rash  . Levofloxacin Itching    Patient Measurements: Height: 5\' 3"  (160 cm) Weight: 229 lb (103.9 kg) IBW/kg (Calculated) : 52.4 Heparin Dosing Weight: 77 kg  Vital Signs: Temp: 98.3 F (36.8 C) (01/06 0000) Temp Source: Oral (01/06 0000) BP: 153/66 (01/06 0000) Pulse Rate: 102 (01/06 0000)  Labs: Recent Labs    01/12/19 1857 01/13/19 0341 01/13/19 0639 01/13/19 0651 01/13/19 0849 01/13/19 1044 01/13/19 1356 01/13/19 2341  HGB 14.6  --   --   --  15.2*  --   --   --   HCT 45.2  --   --   --  46.9*  --   --   --   PLT 164  --   --   --  162  --   --   --   APTT  --    < >  --  165* 131*  --  100* 118*  LABPROT  --   --   --  19.7*  --   --   --   --   INR  --   --   --  1.7*  --   --   --   --   HEPARINUNFRC  --   --  1.09*  --   --   --   --   --   CREATININE 1.70*  --   --  1.54* 1.50* 1.68*  --   --   TROPONINIHS 86*  --   --  688* 843* 1,023* 1,023*  --    < > = values in this interval not displayed.    Estimated Creatinine Clearance: 36.9 mL/min (A) (by C-G formula based on SCr of 1.68 mg/dL (H)).  Medications:  PTA Xarelto 20mg  daily - Last dose 01/11/19 @ 22:00  Scheduled:  . Chlorhexidine Gluconate Cloth  6 each Topical Daily  . latanoprost  1 drop Both Eyes QHS  . mouth rinse  15 mL Mouth Rinse BID  . metoprolol tartrate  5 mg Intravenous Q8H   Infusions:  . sodium chloride Stopped (01/14/19 0027)  . dextrose 5 % and 0.45% NaCl 75 mL/hr at 01/14/19 0030  . heparin 1,050 Units/hr (01/14/19 0038)  . insulin 4 mL/hr at 01/14/19 0030    Assessment: 62 yoF with extensive cardiac history including DVT on Xarelto, CAD s/p CABG 2007, HFrEF with AICD, HTN, HLD, DM, presents  with n/v d/t pancreatitis. Pharmacy consulted to dose IV heparin while Xarelto on hold   Prior anticoagulation: Xarelto 20 mg daily, last dose 01/11/19  Significant events:  Today, 01/14/2019:  CBC: WNL  HS troponin continues to rise, but Cards attributing to demand ischemia  APTT increasing, but heparin level now < 0.7 on 1050 units/hr  SCr slightly above baseline but stable  No bleeding or infusion issues noted  Goal of Therapy: Heparin level 0.3-0.7 units/ml Monitor platelets by anticoagulation protocol: Yes  Plan:  Continue heparin at 1050 units/hr IV infusion  Daily CBC and heparin level  Monitor for signs of bleeding or thrombosis  F/u for ability to transition back to [employer], PharmD, BCPS [phone] 01/14/2019, 12:50 AM

## 2019-01-15 LAB — LIPASE, BLOOD: Lipase: 359 U/L — ABNORMAL HIGH (ref 11–51)

## 2019-01-15 LAB — GLUCOSE, CAPILLARY
Glucose-Capillary: 298 mg/dL — ABNORMAL HIGH (ref 70–99)
Glucose-Capillary: 284 mg/dL — ABNORMAL HIGH (ref 70–99)
Glucose-Capillary: 299 mg/dL — ABNORMAL HIGH (ref 70–99)
Glucose-Capillary: 234 mg/dL — ABNORMAL HIGH (ref 70–99)

## 2019-01-15 LAB — URINE CULTURE: Culture: >=100000 — AB

## 2019-01-15 LAB — CBC
HCT: 39.3 % (ref 36.0–46.0)
Hemoglobin: 12.3 g/dL (ref 12.0–15.0)
MCH: 31.8 pg (ref 26.0–34.0)
MCHC: 31.3 g/dL (ref 30.0–36.0)
MCV: 101.6 fL — ABNORMAL HIGH (ref 80.0–100.0)
Platelets: 120 10*3/uL — ABNORMAL LOW (ref 150–400)
RBC: 3.87 MIL/uL (ref 3.87–5.11)
RDW: 13.5 % (ref 11.5–15.5)
WBC: 11.7 10*3/uL — ABNORMAL HIGH (ref 4.0–10.5)
nRBC: 0 % (ref 0.0–0.2)

## 2019-01-15 LAB — HEPARIN LEVEL (UNFRACTIONATED)
Heparin Unfractionated: 0.23 IU/mL — ABNORMAL LOW (ref 0.30–0.70)
Heparin Unfractionated: 0.43 IU/mL (ref 0.30–0.70)
Heparin Unfractionated: 0.4 IU/mL (ref 0.30–0.70)

## 2019-01-15 LAB — COMPREHENSIVE METABOLIC PANEL
ALT: 167 U/L — ABNORMAL HIGH (ref 0–44)
AST: 71 U/L — ABNORMAL HIGH (ref 15–41)
Albumin: 2.7 g/dL — ABNORMAL LOW (ref 3.5–5.0)
Alkaline Phosphatase: 109 U/L (ref 38–126)
Anion gap: 10 (ref 5–15)
BUN: 33 mg/dL — ABNORMAL HIGH (ref 8–23)
CO2: 20 mmol/L — ABNORMAL LOW (ref 22–32)
Calcium: 8.1 mg/dL — ABNORMAL LOW (ref 8.9–10.3)
Chloride: 104 mmol/L (ref 98–111)
Creatinine, Ser: 1.58 mg/dL — ABNORMAL HIGH (ref 0.44–1.00)
GFR calc Af Amer: 39 mL/min — ABNORMAL LOW (ref >60)
GFR calc non Af Amer: 33 mL/min — ABNORMAL LOW (ref >60)
Glucose, Bld: 313 mg/dL — ABNORMAL HIGH (ref 70–99)
Potassium: 4.5 mmol/L (ref 3.5–5.1)
Sodium: 134 mmol/L — ABNORMAL LOW (ref 135–145)
Total Bilirubin: 1.6 mg/dL — ABNORMAL HIGH (ref 0.3–1.2)
Total Protein: 6.4 g/dL — ABNORMAL LOW (ref 6.5–8.1)

## 2019-01-15 LAB — LACTIC ACID, PLASMA: Lactic Acid, Venous: 1.9 mmol/L (ref 0.5–1.9)

## 2019-01-15 MED ORDER — INSULIN GLARGINE 100 UNIT/ML ~~LOC~~ SOLN
35.0000 [IU] | Freq: Every day | SUBCUTANEOUS | Status: DC
  Administered 2019-01-16 – 2019-01-21 (×6): 35 [IU] via SUBCUTANEOUS
  Filled 2019-01-15 (×7): qty 0.35

## 2019-01-15 MED ORDER — INSULIN GLARGINE 100 UNIT/ML ~~LOC~~ SOLN
15.0000 [IU] | Freq: Once | SUBCUTANEOUS | Status: AC
  Administered 2019-01-15: 15 [IU] via SUBCUTANEOUS
  Filled 2019-01-15: qty 0.15

## 2019-01-15 MED ORDER — CARVEDILOL 12.5 MG PO TABS: 12.5000 mg | ORAL_TABLET | Freq: Two times a day (BID) | ORAL | Status: DC

## 2019-01-15 MED ORDER — CARVEDILOL 12.5 MG PO TABS
12.5000 mg | ORAL_TABLET | Freq: Two times a day (BID) | ORAL | Status: DC
  Administered 2019-01-15 – 2019-01-18 (×5): 12.5 mg via ORAL
  Filled 2019-01-15 (×6): qty 1

## 2019-01-15 MED ORDER — IPRATROPIUM-ALBUTEROL 0.5-2.5 (3) MG/3ML IN SOLN: 3.0000 mL | Freq: Four times a day (QID) | RESPIRATORY_TRACT | Status: DC | PRN

## 2019-01-15 MED ORDER — HEPARIN (PORCINE) 25000 UT/250ML-% IV SOLN
1700.0000 [IU]/h | INTRAVENOUS | Status: DC
  Administered 2019-01-15 – 2019-01-16 (×3): 1300 [IU]/h via INTRAVENOUS
  Administered 2019-01-17 – 2019-01-18 (×3): 1700 [IU]/h via INTRAVENOUS
  Filled 2019-01-15 (×5): qty 250

## 2019-01-15 MED ORDER — ISOSORBIDE MONONITRATE ER 30 MG PO TB24
30.0000 mg | ORAL_TABLET | Freq: Every day | ORAL | Status: DC
  Administered 2019-01-15 – 2019-01-21 (×7): 30 mg via ORAL
  Filled 2019-01-15 (×7): qty 1

## 2019-01-15 MED ORDER — HYDROCODONE-ACETAMINOPHEN 7.5-325 MG PO TABS
1.0000 | ORAL_TABLET | Freq: Four times a day (QID) | ORAL | Status: DC | PRN
  Administered 2019-01-15 – 2019-01-21 (×14): 1 via ORAL
  Filled 2019-01-15 (×15): qty 1

## 2019-01-15 MED ORDER — INSULIN ASPART 100 UNIT/ML ~~LOC~~ SOLN
5.0000 [IU] | Freq: Three times a day (TID) | SUBCUTANEOUS | Status: DC
  Administered 2019-01-15 – 2019-01-21 (×11): 5 [IU] via SUBCUTANEOUS

## 2019-01-15 MED ORDER — CEPHALEXIN 500 MG PO CAPS
500.0000 mg | ORAL_CAPSULE | Freq: Two times a day (BID) | ORAL | Status: AC
  Administered 2019-01-15 – 2019-01-17 (×6): 500 mg via ORAL
  Filled 2019-01-15 (×6): qty 1

## 2019-01-15 NOTE — Progress Notes (Signed)
Progress Note  Patient Name: [name] Date of Encounter: 01/15/2019  Primary Cardiologist: Candee Furbish, MD   Subjective   No acute overnight events. Abdominal pain improved some from yesterday and she does look like she is feeling better today. No chest pain or shortness of breath. She states she feels like her breathing is at baseline. No palpitations.  Inpatient Medications    Scheduled Meds: . Chlorhexidine Gluconate Cloth  6 each Topical Daily  . insulin aspart  0-5 Units Subcutaneous QHS  . insulin aspart  0-9 Units Subcutaneous TID WC  . insulin glargine  20 Units Subcutaneous Daily  . latanoprost  1 drop Both Eyes QHS  . mouth rinse  15 mL Mouth Rinse BID  . metoprolol tartrate  5 mg Intravenous Q8H   Continuous Infusions: . sodium chloride Stopped (01/14/19 0027)  . cefTRIAXone (ROCEPHIN)  IV    . heparin 1,050 Units/hr (01/15/19 0034)   PRN Meds: albuterol, dextrose, morphine injection, ondansetron (ZOFRAN) IV, traMADol   Vital Signs    Vitals:   01/14/19 2200 01/14/19 2355 01/15/19 0000 01/15/19 0400  BP: (!) 163/86  (!) 153/75 (!) 149/59  Pulse: 96  86 92  Resp: (!) 25  (!) 22 (!) 24  Temp: 98.7 F (37.1 C) 99.3 F (37.4 C)  98.8 F (37.1 C)  TempSrc: Oral Axillary  Axillary  SpO2: 95%  95% 96%  Weight:      Height:        Intake/Output Summary (Last 24 hours) at 01/15/2019 0750 Last data filed at 01/15/2019 0400 Gross per 24 hour  Intake 997.53 ml  Output 675 ml  Net 322.53 ml   Last 3 Weights 01/12/2019 12/30/2018 12/09/2018  Weight (lbs) 229 lb 229 lb 234 lb  Weight (kg) 103.874 kg 103.874 kg 106.142 kg      Telemetry    Normal sinus rhythm with multiple PACs/PVCs and one 4 beat run of non-sustained VT. Rates in the 80's to low 100's. - Personally Reviewed  ECG    No new tracing today. - Personally Reviewed  Physical Exam   GEN: No acute distress.   Neck: Supple. No JVD Cardiac: RRR. No murmurs, rubs, or gallops. Radial pulses 2+  and equal bilaterally. Respiratory: Expiratory wheezes/rhonchi noted bilaterally but no significant crackles.  GI: Soft, non-tender, non-distended. Bowel sounds present. MS: No edema. No deformity. Skin: Warm and dry. Neuro:  Nonfocal  Psych: Normal affect   Labs    High Sensitivity Troponin:   Recent Labs  Lab 01/12/19 1857 01/13/19 0651 01/13/19 0849 01/13/19 1044 01/13/19 1356  TROPONINIHS 86* 688* 843* 1,023* 1,023*      Chemistry Recent Labs  Lab 01/13/19 0651 01/13/19 1044 01/14/19 0140 01/15/19 0204  NA 137 138 137 134*  K 5.2* 5.0 4.7 4.5  CL 106 106 107 104  CO2 17* 18* 19* 20*  GLUCOSE 294* 340* 259* 313*  BUN 31* 32* 39* 33*  CREATININE 1.54* 1.68* 1.83* 1.58*  CALCIUM 8.8* 8.6* 8.2* 8.1*  PROT 7.3  --  6.8 6.4*  ALBUMIN 3.1*  --  3.0* 2.7*  AST 358*  --  153* 71*  ALT 439*  --  265* 167*  ALKPHOS 156*  --  131* 109  BILITOT 3.0*  --  2.5* 1.6*  GFRNONAA 34* 31* 28* 33*  GFRAA 40* 36* 32* 39*  ANIONGAP 14 14 11 10      Hematology Recent Labs  Lab 01/13/19 [id number] 01/14/19 0140 01/15/19 [id number]  WBC 13.0* 14.2* 11.7*  RBC 4.70 4.25 3.87  HGB 15.2* 13.7 12.3  HCT 46.9* 43.2 39.3  MCV 99.8 101.6* 101.6*  MCH 32.3 32.2 31.8  MCHC 32.4 31.7 31.3  RDW 13.5 13.6 13.5  PLT 162 146* 120*    BNP Recent Labs  Lab 01/12/19 1857  BNP 88.3     DDimer No results for input(s): DDIMER in the last 168 hours.   Radiology    ECHOCARDIOGRAM COMPLETE  Result Date: 01/13/2019   ECHOCARDIOGRAM REPORT   Patient Name:   [name] Date of Exam: 01/13/2019 Medical Rec #:  [id number]      Height:       63.0 in Accession #:    [id number]     Weight:       229.0 lb Date of Birth:  [birth year]      BSA:          2.05 m Patient Age:    69 years       BP:           151/96 mmHg Patient Gender: F              HR:           116 bpm. Exam Location:  Inpatient Procedure: 2D Echo, Color Doppler, Cardiac Doppler and Intracardiac            Opacification Agent Indications:     Elevated Troponin  History:        Patient has prior history of Echocardiogram examinations, most                 recent 03/13/2015. CHF, Defibrillator and Prior CABG,                 Arrythmias:LBBB; Risk Factors:Dyslipidemia, Diabetes,                 Hypertension and Sleep Apnea.  Sonographer:    Raquel Sarna Senior RDCS Referring Phys: [id number] Daune Perch  Sonographer Comments: Technically difficult study due to poor echo windows, patient is morbidly obese, no apical window and no subcostal window. Image acquisition challenging due to patient body habitus. IMPRESSIONS  1. Suboptimal image quality for diagnosis of biventricular function and wall motion.  2. Left ventricular ejection fraction, by visual estimation, is 50%. The left ventricle has limited views for evaluation of function. Possible apical wall motion abnormality. There is mildly increased left ventricular hypertrophy.  3. Global right ventricle was not well visualized.The right ventricular size is not well visualized. Right vetricular wall thickness was not assessed.  4. Left atrial size was not well visualized.  5. Right atrial size was not well visualized.  6. Mild mitral annular calcification.  7. The mitral valve is abnormal. No evidence of mitral valve regurgitation. No evidence of mitral stenosis.  8. The tricuspid valve is not well visualized.  9. The aortic valve was not well visualized. Aortic valve regurgitation is not visualized. No evidence of aortic valve sclerosis or stenosis. FINDINGS  Left Ventricle: Left ventricular ejection fraction, by visual estimation, is 50%. The left ventricle has limited views for evaluation of function. The left ventricle is not well visualized. There is mildly increased left ventricular hypertrophy. Left ventricular diastolic parameters are indeterminate. Normal left atrial pressure. Right Ventricle: The right ventricular size is not well visualized. Right vetricular wall thickness was not assessed. Global RV  systolic function is was not well visualized. Left Atrium: Left atrial size was not well visualized. Right  Atrium: Right atrial size was not well visualized Pericardium: There is no evidence of pericardial effusion. Mitral Valve: The mitral valve is abnormal. Mild mitral annular calcification. No evidence of mitral valve regurgitation. No evidence of mitral valve stenosis by observation. Tricuspid Valve: The tricuspid valve is not well visualized. Tricuspid valve regurgitation is not demonstrated. Aortic Valve: The aortic valve was not well visualized. Aortic valve regurgitation is not visualized. The aortic valve is structurally normal, with no evidence of sclerosis or stenosis. Pulmonic Valve: The pulmonic valve was not well visualized. Pulmonic valve regurgitation is mild. Pulmonic regurgitation is mild. Aorta: The ascending aorta was not well visualized, the aortic root is normal in size and structure and the aortic arch was not well visualized. Venous: The inferior vena cava was not well visualized. IAS/Shunts: The interatrial septum was not assessed.  LEFT VENTRICLE PLAX 2D LVIDd:         4.90 cm LVIDs:         4.00 cm LV PW:         1.30 cm LV IVS:        1.30 cm LVOT diam:     2.00 cm LV SV:         43 ml LV SV Index:   19.43 LVOT Area:     3.14 cm  LEFT ATRIUM           Index LA diam:      3.70 cm 1.81 cm/m LA Vol (A4C): 58.5 ml 28.56 ml/m  AORTIC VALVE LVOT Vmax:   69.00 cm/s LVOT Vmean:  51.400 cm/s LVOT VTI:    0.095 m  AORTA Ao Root diam: 3.00 cm MITRAL VALVE MV Area (PHT): 6.83 cm              SHUNTS MV PHT:        32.19 msec            Systemic VTI:  0.10 m MV Decel Time: 111 msec              Systemic Diam: 2.00 cm MV E velocity: 127.00 cm/s 103 cm/s MV A velocity: 42.20 cm/s  70.3 cm/s MV E/A ratio:  3.01        1.5  Cherlynn Kaiser MD Electronically signed by Cherlynn Kaiser MD Signature Date/Time: 01/13/2019/8:56:38 PM    Final     Cardiac Studies   Echocardiogram 01/13/2019: Impressions:   1. Suboptimal image quality for diagnosis of biventricular function and wall motion.  2. Left ventricular ejection fraction, by visual estimation, is 50%. The left ventricle has limited views for evaluation of function. Possible apical wall motion abnormality. There is mildly increased left ventricular hypertrophy.  3. Global right ventricle was not well visualized.The right ventricular size is not well visualized. Right vetricular wall thickness was not assessed.  4. Left atrial size was not well visualized.  5. Right atrial size was not well visualized.  6. Mild mitral annular calcification.  7. The mitral valve is abnormal. No evidence of mitral valve regurgitation. No evidence of mitral stenosis.  8. The tricuspid valve is not well visualized.  9. The aortic valve was not well visualized. Aortic valve regurgitation is not visualized. No evidence of aortic valve sclerosis or stenosis.  Patient Profile   Ms. Rosenthal is a 69 y.o. female with a history of CAD s/p CABG [id number], chronic systolic CHF/ischemic cardiomyopathy, ventricular tachycardia s/p ICD, diabetes mellitus, hypertension, history of DVT 09/2015 now on anticoagulation, OSA who is being  seen today for the evaluation of elevated troponin at the request of Dr. Tawanna Solo.   Assessment & Plan    Elevated Troponin with Known CAD s/p CABG in 2007 - Patient presented with abdominal pain due to acute pancreatitis and found to have elevated troponin.  - High-sensitivity troponin elevated at 688 >> 843 >> 1,023 >> 1,023. - Echo shows LVEF of 50% with possible apical wall motion abnormality. - No chest pain.  - Likely demand ischemia and can plan for outpatient ischemia evaluation once her acute issues have resolved.  - Continue IV Heparin for DVT prophylaxis for now.   Chronic Combine CHF  - Echo as above. - BNP normal at 88 on admission.  - Home Lasix and Spironolactone have been on hold because she was receiving  IV fluids given acute  pancreatitis. No longer on fluids.  - Net negative 5.8 L since admission but does not appear significantly volume overloaded on exam. Will hold off on any diuresis at this time and discuss with MD. - Spoke with Dr. Tawanna Solo who feels like it is OK to restart some of home PO medications. Therefore, will stop IV Metoprolol and restart home Coreg 12.5mg  twice daily. Will start home Imdur 30mg  daily. - Continue to monitor daily weights, strict I/O's, and renal function.   History of VT s/p ICD  - Not an active issue.  - Device interrogation showed no VT.  - Will give IV metoprolol given that she is NPO.   Hypertension  - BP 171/84.  - Currently on IV Lopressor every 8 hours but will stop and resume home Coreg 12.5mg  twice daily and Imdur 30mg  daily. - Will continue to hold off on Lisinopril and Spironolactone for now. Creatinine 1.58 today but this is improved from yesterday.  Hyperlipidemia  - Restart home Crestor when able to take PO if  liver enzymes have improved.   Prior DVT  - Xarelto on hold. Continue Heparin.   Acute Pancreatitis  - WBC 11.7 today, improved from 14.2 yesterday. - Lactic acid improved and now 1.9. - Lipase and LFTs improving but still elevated.  - Management per primary team.  For questions or updates, please contact [place] Please consult [email or web address] for contact info under        Signed, Darreld Mclean, PA-C  01/15/2019, 7:50 AM

## 2019-01-15 NOTE — Progress Notes (Signed)
Brief pharmacy anti-coagulation note:  For full details see note from Arlyn Dunning, PharmD from earlier today  Assessment and Plan 1953 HL=0.40 therapeutic  No bleeding or line issues per RN Continue heparin drip at 1300 units/hr Daily heparin level & CBC   [name], PharmD, BCPS 01/15/2019, 8:52 PM

## 2019-01-15 NOTE — Progress Notes (Addendum)
PROGRESS NOTE    [name]  [id number] DOB: 05-Jul-1950 DOA: 01/12/2019 PCP: Everardo Beals, NP   Brief Narrative:  Patient is a 69 year old female with history of coronary artery disease status post CABG, chronic systolic heart failure status post AICD, history of V. tach, diabetes type 2, chronic kidney disease stage III, history of DVT on anticoagulation who presents to the emergency department with complaints of worsening abdominal pain.  She was complaining  of generalized abdominal pain mostly in the epigastric area with nausea and vomiting since last 3 weeks.  She was not able to tolerate food or medications .  Pain worsened over 24 to 48 hours.  On presentation she was afebrile.  She was found to have elevated liver enzymes, elevated lipase.  Elevated troponin.  EKG showed nonspecific ST changes.  Denied any chest pain.  CT abdomen/pelvis was concerning for acute pancreatitis. She  Had elevated anion gap, low CO2 concerning for DKA.  Patient was admitted for the management of pancreatitis, possible NSTEMI, DKA, UTI.  Cardiology and GI following.  Currently on heparin drip.  01/15/19: Patient feels better today.  She has minimal abdominal discomfort.  There was no significant tenderness on palpation.  She is tolerating diet.  Advanced the diet to soft.  Her blood sugars were high so insulin adjusted.  We will continue heparin drip for now and restart some of her home medicines. I have requested general surgery consultation today for plan for cholecystectomy. She complains of back pain.  Assessment & Plan:   Principal Problem:   Acute pancreatitis Active Problems:   S/P CABG x 5   Chronic combined systolic and diastolic heart failure (HCC)   AKI (acute kidney injury) (HCC)   DVT (deep venous thrombosis) (HCC)   DKA (diabetic ketoacidoses) (HCC)   Abnormal LFTs   Acute pancreatitis: Most likely gallstone pancreatitis.  Presented with epigastric abdominal pain.  Pain is better  this morning.  Diet advanced to soft GI following.  CT abdomen/pelvis showed features of pancreatitis,gallstones.  Elevated liver enzymes, lipase and these are all improving .  Not a candidate for ERCP as per GI.  I have requested for general surgery consultation for possible cholecystectomy.  Elevated troponin: Significant elevated troponin on presentation.  Denies any  chest pain.  Nonspecific changes in the EKG.  Echocardiogram  showed ejection fraction of 50%, possible apical wall motion abnormality.  Cardiology closely following.  This is most likely secondary to demand ischemia.  No plan for intervention.  She has history of coronary artery disease and is status post CABG.  Suspected DKA: Low CO2, elevated anion gap on presentation .  History of diabetes type 2.  On insulin at home.  Off insulin drip now.  Started on Lantus and sliding scale.  Continue monitoring sugars.  Insulin dose adjusted today.  Diabetic oriented following.  Chronic combined  CHF: Currently euvolemic .  Has AICD.  Pacemaker interrogated without finding of VT.  Echocardiogram as above . Cardiology following.  She is on carvedilol, Lasix, Imdur, lisinopril, statin, spironolactone .  Lasix and spironolactone  on hold because she is on gentle IV fluids for pancreatitis.  Acute on CKD stage IIIa: Secondary to vomiting, poor oral intake.  Continue to monitor.  Continue IV fluids.Monitor kidney function.  Baseline creatinine around 1.2.  Suspected UTI:  UA was suggestive of UTI.  Urine culture showed proteus .  Denies dysuria.  Abx changed to oral.  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  On Xarelto at home.  On heparin drip currently because she might need cholecystectomy in the near future during this hospitalization.  Lactic acidosis: Secondary to dehydration.  Lactate level improved.  Low suspicion for infectious etiology.  Debility/deconditioning: PT evaluated her and recommended home health.            DVT prophylaxis: IV heparin Code Status: Full code Family Communication: Called and discussed with daughter on phone on 01/13/19 Called son today and gave update today Disposition Plan: Likely home after clinically stability.might need cholecystectomy.  PT recommended home health.  Consultants: GI, cardiology  Procedures: None  Antimicrobials:  Anti-infectives (From admission, onward)   Start     Dose/Rate Route Frequency Ordered Stop   01/14/19 1400  cefTRIAXone (ROCEPHIN) 1 g in sodium chloride 0.9 % 100 mL IVPB     1 g 200 mL/hr over 30 Minutes Intravenous Every 24 hours 01/14/19 1206     01/13/19 2200  cefTRIAXone (ROCEPHIN) 2 g in sodium chloride 0.9 % 100 mL IVPB  Status:  Discontinued     2 g 200 mL/hr over 30 Minutes Intravenous Every 24 hours 01/12/19 2338 01/13/19 1400   01/12/19 2345  metroNIDAZOLE (FLAGYL) IVPB 500 mg  Status:  Discontinued     500 mg 100 mL/hr over 60 Minutes Intravenous Every 8 hours 01/12/19 2338 01/13/19 1400   01/12/19 2300  cefTRIAXone (ROCEPHIN) 2 g in sodium chloride 0.9 % 100 mL IVPB     2 g 200 mL/hr over 30 Minutes Intravenous  Once 01/12/19 2247 01/13/19 0023   01/12/19 2245  cefTRIAXone (ROCEPHIN) 1 g in sodium chloride 0.9 % 100 mL IVPB  Status:  Discontinued     1 g 200 mL/hr over 30 Minutes Intravenous  Once 01/12/19 2242 01/12/19 2247      Subjective:  P Objective: Vitals:   01/15/19 0800 01/15/19 0900 01/15/19 1000 01/15/19 1200  BP: (!) 158/70     Pulse: 82 87 97   Resp: (!) 26 (!) 24 (!) 23   Temp: 98.2 F (36.8 C)   98.1 F (36.7 C)  TempSrc: Oral   Oral  SpO2: 95% 95% 96%   Weight:      Height:        Intake/Output Summary (Last 24 hours) at 01/15/2019 1343 Last data filed at 01/15/2019 1000 Gross per 24 hour  Intake 703.51 ml  Output 675 ml  Net 28.51 ml   Filed Weights   01/12/19 1740  Weight: 103.9 kg    Examination:  General exam: Not in distress, morbidly obese HEENT:PERRL,Oral mucosa moist,  Ear/Nose normal on gross exam Respiratory system: Bilateral equal air entry, normal vesicular breath sounds, no wheezes or crackles  Cardiovascular system: Sinus tachycardia, RRR. No JVD, murmurs, rubs, gallops or clicks. No pedal edema. Gastrointestinal system: Abdomen is nondistended, soft and has very  mild tenderness on the epigastric region. No organomegaly or masses felt. Normal bowel sounds heard. Central nervous system: Alert and oriented. No focal neurological deficits. Extremities: No edema, no clubbing ,no cyanosis, distal peripheral pulses palpable. Skin: No rashes, lesions or ulcers,no icterus ,no pallor .     Data Reviewed: I have personally reviewed following labs and imaging studies  CBC: Recent Labs  Lab 01/12/19 1857 01/13/19 0849 01/14/19 0140 01/15/19 0204  WBC 13.4* 13.0* 14.2* 11.7*  NEUTROABS 12.3*  --  11.6*  --   HGB 14.6 15.2* 13.7 12.3  HCT 45.2 46.9* 43.2 39.3  MCV 101.3* 99.8  101.6* 101.6*  PLT 164 162 146* [id number]*   Basic Metabolic Panel: Recent Labs  Lab 01/13/19 0651 01/13/19 0849 01/13/19 1044 01/14/19 0140 01/15/19 0204  NA 137 138 138 137 134*  K 5.2* 5.4* 5.0 4.7 4.5  CL 106 107 106 107 104  CO2 17* 18* 18* 19* 20*  GLUCOSE 294* 331* 340* 259* 313*  BUN 31* 31* 32* 39* 33*  CREATININE 1.54* 1.50* 1.68* 1.83* 1.58*  CALCIUM 8.8* 8.8* 8.6* 8.2* 8.1*   GFR: Estimated Creatinine Clearance: 39.3 mL/min (A) (by C-G formula based on SCr of 1.58 mg/dL (H)). Liver Function Tests: Recent Labs  Lab 01/12/19 1857 01/13/19 0651 01/14/19 0140 01/15/19 0204  AST 819* 358* 153* 71*  ALT 589* 439* 265* 167*  ALKPHOS 170* 156* 131* 109  BILITOT 2.1* 3.0* 2.5* 1.6*  PROT 7.8 7.3 6.8 6.4*  ALBUMIN 3.5 3.1* 3.0* 2.7*   Recent Labs  Lab 01/12/19 1857 01/15/19 0204  LIPASE 4,497* 359*   No results for input(s): AMMONIA in the last 168 hours. Coagulation Profile: Recent Labs  Lab 01/13/19 0651  INR 1.7*   Cardiac Enzymes: No results  for input(s): CKTOTAL, CKMB, CKMBINDEX, TROPONINI in the last 168 hours. BNP (last 3 results) No results for input(s): PROBNP in the last 8760 hours. HbA1C: Recent Labs    01/13/19 0341  HGBA1C 8.3*   CBG: Recent Labs  Lab 01/14/19 1138 01/14/19 1631 01/14/19 2135 01/15/19 0742 01/15/19 1153  GLUCAP 254* 294* 392* 298* 284*   Lipid Profile: No results for input(s): CHOL, HDL, LDLCALC, TRIG, CHOLHDL, LDLDIRECT in the last 72 hours. Thyroid Function Tests: No results for input(s): TSH, T4TOTAL, FREET4, T3FREE, THYROIDAB in the last 72 hours. Anemia Panel: No results for input(s): VITAMINB12, FOLATE, FERRITIN, TIBC, IRON, RETICCTPCT in the last 72 hours. Sepsis Labs: Recent Labs  Lab 01/12/19 1857 01/13/19 0341 01/14/19 0140 01/15/19 0204  LATICACIDVEN 6.1* 2.9* 2.5* 1.9    Recent Results (from the past 240 hour(s))  SARS CORONAVIRUS 2 (TAT 6-24 HRS) Nasopharyngeal Nasopharyngeal Swab     Status: None   Collection Time: 01/12/19  8:45 PM   Specimen: Nasopharyngeal Swab  Result Value Ref Range Status   SARS Coronavirus 2 NEGATIVE NEGATIVE Final    Comment: (NOTE) SARS-CoV-2 target nucleic acids are NOT DETECTED. The SARS-CoV-2 RNA is generally detectable in upper and lower respiratory specimens during the acute phase of infection. Negative results do not preclude SARS-CoV-2 infection, do not rule out co-infections with other pathogens, and should not be used as the sole basis for treatment or other patient management decisions. Negative results must be combined with clinical observations, patient history, and epidemiological information. The expected result is Negative. Fact Sheet for Patients: SugarRoll.be Fact Sheet for Healthcare Providers: https://www.woods-mathews.com/ This test is not yet approved or cleared by the [place] FDA and  has been authorized for detection and/or diagnosis of SARS-CoV-2 by FDA under an  Emergency Use Authorization (EUA). This EUA will remain  in effect (meaning this test can be used) for the duration of the COVID-19 declaration under Section 56 4(b)(1) of the Act, 21 U.S.C. section 360bbb-3(b)(1), unless the authorization is terminated or revoked sooner. Performed at [facility], [place] [address], [place], [place] [address]   Urine culture     Status: Abnormal   Collection Time: 01/12/19 10:43 PM   Specimen: Urine, Clean Catch  Result Value Ref Range Status   Specimen Description   Final    URINE, CLEAN CATCH  Performed at [facility], [place] [address], [place], [place] [address]    Special Requests   Final    NONE Performed at [facility], [place] [address], [place], [place] [address]    Culture >=100,000 COLONIES/mL PROTEUS MIRABILIS (A)  Final   Report Status 01/15/2019 FINAL  Final   Organism ID, Bacteria PROTEUS MIRABILIS (A)  Final      Susceptibility   Proteus mirabilis - MIC*    AMPICILLIN <=2 SENSITIVE Sensitive     CEFAZOLIN <=4 SENSITIVE Sensitive     CEFTRIAXONE <=0.25 SENSITIVE Sensitive     CIPROFLOXACIN <=0.25 SENSITIVE Sensitive     GENTAMICIN <=1 SENSITIVE Sensitive     IMIPENEM 2 SENSITIVE Sensitive     NITROFURANTOIN 128 RESISTANT Resistant     TRIMETH/SULFA <=20 SENSITIVE Sensitive     AMPICILLIN/SULBACTAM <=2 SENSITIVE Sensitive     PIP/TAZO <=4 SENSITIVE Sensitive     * >=100,000 COLONIES/mL PROTEUS MIRABILIS  MRSA PCR Screening     Status: None   Collection Time: 01/13/19  5:43 PM   Specimen: Nasopharyngeal  Result Value Ref Range Status   MRSA by PCR NEGATIVE NEGATIVE Final    Comment:        The GeneXpert MRSA Assay (FDA approved for NASAL specimens only), is one component of a comprehensive MRSA colonization surveillance program. It is not intended to diagnose MRSA infection nor to guide or monitor treatment for MRSA infections. Performed at [facility], [place]  [address] [place], [place] [address]          Radiology Studies: ECHOCARDIOGRAM COMPLETE  Result Date: 01/13/2019   ECHOCARDIOGRAM REPORT   Patient Name:   SHELBY NILA Date of Exam: 01/13/2019 Medical Rec #:  [id number]      Height:       63.0 in Accession #:    [id number]     Weight:       229.0 lb Date of Birth:  [birth year]      BSA:          2.05 m Patient Age:    28 years       BP:           151/96 mmHg Patient Gender: F              HR:           116 bpm. Exam Location:  Inpatient Procedure: 2D Echo, Color Doppler, Cardiac Doppler and Intracardiac            Opacification Agent Indications:    Elevated Troponin  History:        Patient has prior history of Echocardiogram examinations, most                 recent 03/13/2015. CHF, Defibrillator and Prior CABG,                 Arrythmias:LBBB; Risk Factors:Dyslipidemia, Diabetes,                 Hypertension and Sleep Apnea.  Sonographer:    Raquel Sarna Senior RDCS Referring Phys: [id number] Daune Perch  Sonographer Comments: Technically difficult study due to poor echo windows, patient is morbidly obese, no apical window and no subcostal window. Image acquisition challenging due to patient body habitus. IMPRESSIONS  1. Suboptimal image quality for diagnosis of biventricular function and wall motion.  2. Left ventricular ejection fraction, by visual estimation, is 50%. The left ventricle has limited views  for evaluation of function. Possible apical wall motion abnormality. There is mildly increased left ventricular hypertrophy.  3. Global right ventricle was not well visualized.The right ventricular size is not well visualized. Right vetricular wall thickness was not assessed.  4. Left atrial size was not well visualized.  5. Right atrial size was not well visualized.  6. Mild mitral annular calcification.  7. The mitral valve is abnormal. No evidence of mitral valve regurgitation. No evidence of mitral stenosis.  8. The tricuspid valve is not well visualized.   9. The aortic valve was not well visualized. Aortic valve regurgitation is not visualized. No evidence of aortic valve sclerosis or stenosis. FINDINGS  Left Ventricle: Left ventricular ejection fraction, by visual estimation, is 50%. The left ventricle has limited views for evaluation of function. The left ventricle is not well visualized. There is mildly increased left ventricular hypertrophy. Left ventricular diastolic parameters are indeterminate. Normal left atrial pressure. Right Ventricle: The right ventricular size is not well visualized. Right vetricular wall thickness was not assessed. Global RV systolic function is was not well visualized. Left Atrium: Left atrial size was not well visualized. Right Atrium: Right atrial size was not well visualized Pericardium: There is no evidence of pericardial effusion. Mitral Valve: The mitral valve is abnormal. Mild mitral annular calcification. No evidence of mitral valve regurgitation. No evidence of mitral valve stenosis by observation. Tricuspid Valve: The tricuspid valve is not well visualized. Tricuspid valve regurgitation is not demonstrated. Aortic Valve: The aortic valve was not well visualized. Aortic valve regurgitation is not visualized. The aortic valve is structurally normal, with no evidence of sclerosis or stenosis. Pulmonic Valve: The pulmonic valve was not well visualized. Pulmonic valve regurgitation is mild. Pulmonic regurgitation is mild. Aorta: The ascending aorta was not well visualized, the aortic root is normal in size and structure and the aortic arch was not well visualized. Venous: The inferior vena cava was not well visualized. IAS/Shunts: The interatrial septum was not assessed.  LEFT VENTRICLE PLAX 2D LVIDd:         4.90 cm LVIDs:         4.00 cm LV PW:         1.30 cm LV IVS:        1.30 cm LVOT diam:     2.00 cm LV SV:         43 ml LV SV Index:   19.43 LVOT Area:     3.14 cm  LEFT ATRIUM           Index LA diam:      3.70 cm 1.81  cm/m LA Vol (A4C): 58.5 ml 28.56 ml/m  AORTIC VALVE LVOT Vmax:   69.00 cm/s LVOT Vmean:  51.400 cm/s LVOT VTI:    0.095 m  AORTA Ao Root diam: 3.00 cm MITRAL VALVE MV Area (PHT): 6.83 cm              SHUNTS MV PHT:        32.19 msec            Systemic VTI:  0.10 m MV Decel Time: 111 msec              Systemic Diam: 2.00 cm MV E velocity: 127.00 cm/s 103 cm/s MV A velocity: 42.20 cm/s  70.3 cm/s MV E/A ratio:  3.01        1.5  Cherlynn Kaiser MD Electronically signed by Cherlynn Kaiser MD Signature Date/Time: 01/13/2019/8:56:38 PM  Final         Scheduled Meds: . carvedilol  12.5 mg Oral BID WC  . Chlorhexidine Gluconate Cloth  6 each Topical Daily  . insulin aspart  0-5 Units Subcutaneous QHS  . insulin aspart  0-9 Units Subcutaneous TID WC  . insulin aspart  5 Units Subcutaneous TID WC  . [START ON 01/16/2019] insulin glargine  35 Units Subcutaneous Daily  . isosorbide mononitrate  30 mg Oral Daily  . latanoprost  1 drop Both Eyes QHS  . mouth rinse  15 mL Mouth Rinse BID   Continuous Infusions: . sodium chloride Stopped (01/14/19 0027)  . cefTRIAXone (ROCEPHIN)  IV    . heparin 1,300 Units/hr (01/15/19 1000)     LOS: 3 days    Time spent:35 mins, More than 50% of that time was spent in counseling and/or coordination of care.      Shelly Coss, MD [facility] Pager [phone]  If 7PM-7AM, please contact night-coverage [email or web address] Password [facility] 01/15/2019, 1:43 PM

## 2019-01-15 NOTE — Progress Notes (Addendum)
Progress Note    ASSESSMENT AND PLAN:   .1. Acute pancreatitis,, probably biliary in nature given cholelithiasis and abnormal liver tests. No bile duct dilation on CT scan. Not MRCP candidate.  --Got fairly aggressive fluid resuscitation (hx of heart failure limited replacement to some degree).  Continue IVF at 75 ml / hr until taking more PO. --Liver tests continue to improve, may have passed a stone. Should have cholecystectomy at some point.  --Pain better with Morphine, taking it anywhere from 3-5 doses daily at this point.  --Tolerating clears. Will advance to soft --WBC improving.  --Home diuretics on hold --Continue supportive care.   2. UTI, Proteus MIrabilis.  Currently on Rocephin   3. AKI on CKD. Cr improving 1.83 >>>> 1.58. Close to baseline now.   4. Elevated troponin, no chest pain. Cardiology following and feels likely demand ischemia but may get outpatient ischemic evaluation after resolution of acute issues.   5. Hx of DVT, home Xarelto on hold. On IV heparin until taking PO.    SUBJECTIVE    No complaints. Up in chair.  The abdominal pain is generalized, controlled with analgesics   OBJECTIVE:     Vital signs in last 24 hours: Temp:  [98.1 F (36.7 C)-99.3 F (37.4 C)] 98.2 F (36.8 C) (01/07 0800) Pulse Rate:  [81-140] 87 (01/07 0900) Resp:  [22-32] 24 (01/07 0900) BP: (123-163)/(59-95) 158/70 (01/07 0800) SpO2:  [95 %-98 %] 95 % (01/07 0900) Last BM Date: 01/12/19 General:   Alert,  NAD EENT:  Normal hearing, non icteric sclera   Heart:  Regular rate and rhythm;  No lower extremity edema   Pulm: Normal respiratory effort   Abdomen:  Soft, nondistended, mild diffuse upper tenderness with deep palpation, hypoactive bowel sounds.          Neurologic:  Alert and  oriented x4;  grossly normal neurologically. Psych:  Pleasant, cooperative.  Normal mood and affect.   Intake/Output from previous day: 01/06 0701 - 01/07 0700 In: 997.5  [P.O.:480; I.V.:517.5] Out: 675 [Urine:675] Intake/Output this shift: Total I/O In: 41.6 [I.V.:41.6] Out: -   Lab Results: Recent Labs    01/13/19 0849 01/14/19 0140 01/15/19 0204  WBC 13.0* 14.2* 11.7*  HGB 15.2* 13.7 12.3  HCT 46.9* 43.2 39.3  PLT 162 146* 120*   BMET Recent Labs    01/13/19 1044 01/14/19 0140 01/15/19 0204  NA 138 137 134*  K 5.0 4.7 4.5  CL 106 107 104  CO2 18* 19* 20*  GLUCOSE 340* 259* 313*  BUN 32* 39* 33*  CREATININE 1.68* 1.83* 1.58*  CALCIUM 8.6* 8.2* 8.1*   LFT Recent Labs    01/13/19 0651 01/15/19 0204  PROT 7.3 6.4*  ALBUMIN 3.1* 2.7*  AST 358* 71*  ALT 439* 167*  ALKPHOS 156* 109  BILITOT 3.0* 1.6*  BILIDIR 1.9*  --   IBILI 1.1*  --    PT/INR Recent Labs    01/13/19 0651  LABPROT 19.7*  INR 1.7*   Hepatitis Panel No results for input(s): HEPBSAG, HCVAB, HEPAIGM, HEPBIGM in the last 72 hours.  ECHOCARDIOGRAM COMPLETE  Result Date: 01/13/2019   ECHOCARDIOGRAM REPORT   Patient Name:   [name] Date of Exam: 01/13/2019 Medical Rec #:  [id number]      Height:       63.0 in Accession #:    [id number]     Weight:       229.0 lb Date of Birth:  1950/04/22      BSA:          2.05 m Patient Age:    69 years       BP:           151/96 mmHg Patient Gender: F              HR:           116 bpm. Exam Location:  Inpatient Procedure: 2D Echo, Color Doppler, Cardiac Doppler and Intracardiac            Opacification Agent Indications:    Elevated Troponin  History:        Patient has prior history of Echocardiogram examinations, most                 recent 03/13/2015. CHF, Defibrillator and Prior CABG,                 Arrythmias:LBBB; Risk Factors:Dyslipidemia, Diabetes,                 Hypertension and Sleep Apnea.  Sonographer:    Raquel Sarna Senior RDCS Referring Phys: [id number] Daune Perch  Sonographer Comments: Technically difficult study due to poor echo windows, patient is morbidly obese, no apical window and no subcostal window. Image  acquisition challenging due to patient body habitus. IMPRESSIONS  1. Suboptimal image quality for diagnosis of biventricular function and wall motion.  2. Left ventricular ejection fraction, by visual estimation, is 50%. The left ventricle has limited views for evaluation of function. Possible apical wall motion abnormality. There is mildly increased left ventricular hypertrophy.  3. Global right ventricle was not well visualized.The right ventricular size is not well visualized. Right vetricular wall thickness was not assessed.  4. Left atrial size was not well visualized.  5. Right atrial size was not well visualized.  6. Mild mitral annular calcification.  7. The mitral valve is abnormal. No evidence of mitral valve regurgitation. No evidence of mitral stenosis.  8. The tricuspid valve is not well visualized.  9. The aortic valve was not well visualized. Aortic valve regurgitation is not visualized. No evidence of aortic valve sclerosis or stenosis. FINDINGS  Left Ventricle: Left ventricular ejection fraction, by visual estimation, is 50%. The left ventricle has limited views for evaluation of function. The left ventricle is not well visualized. There is mildly increased left ventricular hypertrophy. Left ventricular diastolic parameters are indeterminate. Normal left atrial pressure. Right Ventricle: The right ventricular size is not well visualized. Right vetricular wall thickness was not assessed. Global RV systolic function is was not well visualized. Left Atrium: Left atrial size was not well visualized. Right Atrium: Right atrial size was not well visualized Pericardium: There is no evidence of pericardial effusion. Mitral Valve: The mitral valve is abnormal. Mild mitral annular calcification. No evidence of mitral valve regurgitation. No evidence of mitral valve stenosis by observation. Tricuspid Valve: The tricuspid valve is not well visualized. Tricuspid valve regurgitation is not demonstrated. Aortic  Valve: The aortic valve was not well visualized. Aortic valve regurgitation is not visualized. The aortic valve is structurally normal, with no evidence of sclerosis or stenosis. Pulmonic Valve: The pulmonic valve was not well visualized. Pulmonic valve regurgitation is mild. Pulmonic regurgitation is mild. Aorta: The ascending aorta was not well visualized, the aortic root is normal in size and structure and the aortic arch was not well visualized. Venous: The inferior vena cava was not well visualized. IAS/Shunts: The interatrial septum was not assessed.  LEFT VENTRICLE PLAX 2D LVIDd:         4.90 cm LVIDs:         4.00 cm LV PW:         1.30 cm LV IVS:        1.30 cm LVOT diam:     2.00 cm LV SV:         43 ml LV SV Index:   19.43 LVOT Area:     3.14 cm  LEFT ATRIUM           Index LA diam:      3.70 cm 1.81 cm/m LA Vol (A4C): 58.5 ml 28.56 ml/m  AORTIC VALVE LVOT Vmax:   69.00 cm/s LVOT Vmean:  51.400 cm/s LVOT VTI:    0.095 m  AORTA Ao Root diam: 3.00 cm MITRAL VALVE MV Area (PHT): 6.83 cm              SHUNTS MV PHT:        32.19 msec            Systemic VTI:  0.10 m MV Decel Time: 111 msec              Systemic Diam: 2.00 cm MV E velocity: 127.00 cm/s 103 cm/s MV A velocity: 42.20 cm/s  70.3 cm/s MV E/A ratio:  3.01        1.5  Cherlynn Kaiser MD Electronically signed by Cherlynn Kaiser MD Signature Date/Time: 01/13/2019/8:56:38 PM    Final       Principal Problem:   Acute pancreatitis Active Problems:   S/P CABG x 5   Chronic combined systolic and diastolic heart failure (HCC)   AKI (acute kidney injury) ([place])   DVT (deep venous thrombosis) (HCC)   DKA (diabetic ketoacidoses) (HCC)   Abnormal LFTs     LOS: 3 days   Tye Savoy ,NP 01/15/2019, 9:27 AM   I have discussed the case with the PA, and that is the plan I formulated. I personally interviewed and examined the patient.  CC: Biliary pancreatitis  Patient symptoms of abdominal pain and nausea are slowly decreasing, she was  able to tolerate small amount of soft food today.  She is not having bowel movements or passing gas, but abdomen is soft with good bowel sounds.  She has copious urine output and renal function slowly improving.  Liver labs, which were markedly elevated upon admission, are slowly improving and imply that she most likely passed a bile duct stone.  Unfortunately, she cannot have an MRCP because of her AICD.  She has multiple medical comorbidities and thus is certainly at increased risk for anesthesia and complications of surgery.  Nevertheless, she will need eventual cholecystectomy with IOC after full recovery from the pancreatitis.  I would continue plan as currently outlined.  Continue current IV fluid rate until her oral intake is consistently improved. We will follow labs daily and follow her closely with you.  Dr. Henrene Pastor will be on service tomorrow.  Total time 25 minutes  Nelida Meuse III Office: [phone]

## 2019-01-15 NOTE — Progress Notes (Addendum)
Inpatient Diabetes Program Recommendations  AACE/ADA: New Consensus Statement on Inpatient Glycemic Control (2015)  Target Ranges:  Prepandial:   less than 140 mg/dL      Peak postprandial:   less than 180 mg/dL (1-2 hours)      Critically ill patients:  140 - 180 mg/dL    Results for [name], [name] (MRN [id number]) as of 01/16/2019 10:48  Ref. Range 01/14/2019 07:26 01/14/2019 08:54 01/14/2019 09:21 01/14/2019 11:38 01/14/2019 16:31 01/14/2019 21:35 01/15/2019 07:42  Glucose-Capillary Latest Ref Range: 70 - 99 mg/dL 191 (H) 219 (H) 233 (H) 254 (H) 294 (H) 392 (H) 298 (H)    Review of Glycemic Control Diabetes history: DM 2 Outpatient Diabetes medications: Glipizide 5 mg QD + Ozempic 0.5 mg Qweek + Levemir 15-20 units Every Other Day + Novolog 50 AM/ 25 Lunch/ 50 Dinner  Current orders for Inpatient glycemic control:  Lantus 35 units (started 1/7) Novolog 0-9 units + hs   Inpatient Diabetes Program Recommendations:    noted Lantus increased today to 35 units.    Consider Novolog 4 units tid meal coverage if eating >50% of meals as postprandial trends increased into the 300's yesterday.  Thanks,  Tama Headings RN, MSN, BC-ADM Inpatient Diabetes Coordinator Team Pager [phone] (8a-5p)

## 2019-01-15 NOTE — Progress Notes (Signed)
[facility] for IV heparin Indication: Hx DVT  Allergies  Allergen Reactions  . Lipitor  [Atorvastatin Calcium] Rash  . Metformin And Related Itching, Swelling and Other (See Comments)    Leg pain & swelling in legs  . Atorvastatin Itching and Rash  . Levofloxacin Itching    Patient Measurements: Height: 5\' 3"  (160 cm) Weight: 229 lb (103.9 kg) IBW/kg (Calculated) : 52.4 Heparin Dosing Weight: 77 kg  Vital Signs: Temp: 98.8 F (37.1 C) (01/07 0400) Temp Source: Axillary (01/07 0400) BP: 149/59 (01/07 0400) Pulse Rate: 92 (01/07 0400)  Labs: Recent Labs    01/12/19 1857 01/13/19 0639 01/13/19 0651 01/13/19 0849 01/13/19 1044 01/13/19 1356 01/13/19 2341 01/14/19 0140 01/14/19 0352 01/15/19 0204  HGB  --    < >  --  15.2*  --   --   --  13.7  --  12.3  HCT  --   --   --  46.9*  --   --   --  43.2  --  39.3  PLT  --   --   --  162  --   --   --  146*  --  120*  APTT   < >  --  165* 131*  --  100* 118*  --   --   --   LABPROT  --   --  19.7*  --   --   --   --   --   --   --   INR  --   --  1.7*  --   --   --   --   --   --   --   HEPARINUNFRC   < >  --   --   --   --   --  0.65  --  0.47 0.23*  CREATININE   < >  --  1.54* 1.50* 1.68*  --   --  1.83*  --  1.58*  TROPONINIHS   < >  --  688* 843* 1,023* 1,023*  --   --   --   --    < > = values in this interval not displayed.    Estimated Creatinine Clearance: 39.3 mL/min (A) (by C-G formula based on SCr of 1.58 mg/dL (H)).  Medications:  PTA Xarelto 20mg  daily - Last dose 01/11/19 @ 22:00  Scheduled:  . Chlorhexidine Gluconate Cloth  6 each Topical Daily  . insulin aspart  0-5 Units Subcutaneous QHS  . insulin aspart  0-9 Units Subcutaneous TID WC  . insulin glargine  20 Units Subcutaneous Daily  . latanoprost  1 drop Both Eyes QHS  . mouth rinse  15 mL Mouth Rinse BID  . metoprolol tartrate  5 mg Intravenous Q8H   Infusions:  . sodium chloride Stopped (01/14/19 0027)  .  cefTRIAXone (ROCEPHIN)  IV    . heparin 1,050 Units/hr (01/15/19 0034)    Assessment: 17 yoF with extensive cardiac history including DVT on Xarelto, CAD s/p CABG 2007, HFrEF with AICD, HTN, HLD, DM, presents with n/v d/t pancreatitis. Pharmacy consulted to dose IV heparin while Xarelto on hold   Prior anticoagulation: Xarelto 20 mg daily, last dose 01/11/19  Significant events:  Today, 01/15/2019:  Heparin level SUBtherapeutic this AM on current IV heparin rate of 1050 units/hr despite being therapeutic the 2 previous lab checks  Hgb continues to be stable but plts trending down - now 120k (was 164k on admission) -  monitor closely  No bleeding or infusion issues noted  Goal of Therapy: Heparin level 0.3-0.7 units/ml Monitor platelets by anticoagulation protocol: Yes  Plan:  Increase IV  heparin from 1050 units/hr to 1300 units/hr  Check another heparin level 6 hours after rate change  Daily CBC and heparin level  Monitor for signs of bleeding or thrombosis  F/u for ability to transition back to [place], PharmD, BCPS Pager [phone] 01/15/2019 7:29 AM

## 2019-01-15 NOTE — Consult Note (Signed)
[facility] Consult Note  [name] [birth year]  [id number].    Requesting MD: Shelly Coss Chief Complaint: Abdominal pain, nausea vomiting CBG 549 Reason for Consult:   HPI:  Patient is a 69 year old female who presented with nausea, abdominal pain, and a glucose of 549.  She has a history of coronary disease/CABG x5, ischemic cardiomyopathy with EF 35%, V. tach, AICD.  Type 2 diabetes, DVT on Xarelto, CKD, morbid obesity, hypertension, and hyperlipidemia.  She been vomiting nonstop for 12 hours prior to admission.  She thought it was food poisoning.  She had not taken her insulin.  Work-up shows she is afebrile, mildly hypertensive.  Sats are good on room air.  Admission labs shows a pH 7.26, PCO2 45.6, PO2 46.2 bicarb 19.9.  Potassium 5.5, CO2 of 19, glucose 490, creatinine 1.7, alk phos 170, lipase 4497.  AST 819, ALT 589, total bilirubin of 2.1.  She was nitrate negative on her urinalysis with 21-50 white cells per high-powered field.  Urine culture showed a Proteus mirabilis UTI.  Chest x-ray showed new slight bibasilar atelectasis but was otherwise unremarkable.  CT of the abdomen pelvis without contrast showed multiple sternal wires, numerous subcentimeter gallstones were seen within a mildly distended gallbladder.  The pancreas is moderate to marked amount of mesenteric inflammatory stranding within the upper abdomen.  This involves peripancreatic region and extends superiorly along the perigastric region.  Inferior extension is seen surrounding the descending colon.  Consistent with moderate to marked acute pancreatitis however given the pattern of distribution inflammatory fat stranding there was some concern in the stomach and descending colon were also involved.  She was admitted to the hospitalist service on 01/12/2019, with acute pancreatitis, DKA, AKI with UTI.  History of combined systolic/diastolic congestive heart failure, lactic acidosis from dehydration and  markedly elevated LFTs probably secondary to gallstone pancreatitis troponins were elevated and this was thought to be more most likely from demand ischemia.  And she is being followed by cardiology.  She was seen by GI and diagnosed with pancreatitis most likely secondary to her gallstones.  With the recommendation for surgery evaluation as her metabolic issues improved.  We are asked to see. Currently sodium is 134, CO2 20, glucose 313, creatinine 1.58, lipase 359, AST 71 ALT 167 total bilirubin 1.6.  She is on IV heparin.  ROS: Review of Systems  Constitutional: Negative.   HENT: Negative.   Eyes: Negative.   Respiratory: Positive for shortness of breath (Chronic dyspnea on exertion for many years.).   Cardiovascular: Positive for leg swelling.  Gastrointestinal: Positive for abdominal pain, nausea and vomiting. Negative for constipation and diarrhea.  Genitourinary:       Proteus UTI on admission  Musculoskeletal: Negative.   Skin: Negative.   Neurological: Negative.   Endo/Heme/Allergies: Bruises/bleeds easily.  Psychiatric/Behavioral: Negative.     Family History  Problem Relation Age of Onset  . Diabetes Mother 81       died - HTN  . Stroke Mother   . High blood pressure Mother   . Sudden death Mother   . Obesity Mother   . Other Other        No early family hx of CAD    Past Medical History:  Diagnosis Date  . AICD (automatic cardioverter/defibrillator) present   . Angina decubitus ([place]) 11/06/2012  . Angina pectoris ([place])   . Anxiety   . Arthritis   . Back pain   . Bundle branch block 05/2016  .  CHF (congestive heart failure) ([place])   . Chronic combined systolic and diastolic CHF, NYHA class 3 ([place])   . Coronary artery disease    s/p CABG 2007  . Depression   . Dermal mycosis   . Diabetes mellitus    type II  . DVT (deep venous thrombosis) ([place]) 09/2015   bilateral  . Edema, lower extremity   . Glaucoma   . Hyperlipidemia   . Hypertension   . Insomnia    . Ischemic cardiomyopathy    EF 30-35%, s/p ICD 4/08 by Dr Leonia Reeves  . Joint pain   . Morbid obesity ([place]) 11/06/2012  . Obesity   . Oxygen dependent 2 L  . Pain in left knee   . Peripheral neuropathy   . 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 Sleep apnea    uses O2 at night  . SOB (shortness of breath)   . Tinea   . Ventricular tachycardia ([place]) 01/16/15   sustained VT terminated with ATP, CL 340 msec    Past Surgical History:  Procedure Laterality Date  . BI-VENTRICULAR IMPLANTABLE CARDIOVERTER DEFIBRILLATOR UPGRADE N/A 09/25/2012   Procedure: BI-VENTRICULAR IMPLANTABLE CARDIOVERTER DEFIBRILLATOR UPGRADE;  Surgeon: Coralyn Mark, MD;  Location: [facility] CATH LAB;  Service: Cardiovascular;  Laterality: N/A;  . BREAST BIOPSY Right 04/05/2014  . CARDIAC CATHETERIZATION    . CARDIAC DEFIBRILLATOR PLACEMENT  4/08   by Dr Leonia Reeves (MDT)  . COLONOSCOPY WITH PROPOFOL N/A 10/12/2016   Procedure: COLONOSCOPY WITH PROPOFOL;  Surgeon: Doran Stabler, MD;  Location: [facility] ENDOSCOPY;  Service: Gastroenterology;  Laterality: N/A;  . CORONARY ARTERY BYPASS GRAFT  10/05/2005   by Dr Cyndia Bent  . IMPLANTABLE CARDIOVERTER DEFIBRILLATOR IMPLANT  09/25/12   attempt of upgrade to CRT-D unsuccessful due to CS anatomy, SJM Unify [place]aura device placed with LV port capped by Dr Rayann Heman  . IR GENERIC HISTORICAL  10/03/2015   IR US GUIDE VASC ACCESS LEFT 10/03/2015 Jacqulynn Cadet, MD [facility]  . IR GENERIC HISTORICAL  10/03/2015   IR VENO/EXT/UNI LEFT 10/03/2015 Jacqulynn Cadet, MD [facility]  . IR GENERIC HISTORICAL  10/03/2015   IR VENOCAVAGRAM IVC 10/03/2015 Jacqulynn Cadet, MD [facility]  . IR GENERIC HISTORICAL  10/03/2015   IR INFUSION THROMBOL VENOUS INITIAL (MS) 10/03/2015 Jacqulynn Cadet, MD [facility]  . IR GENERIC HISTORICAL  10/03/2015   IR US GUIDE VASC ACCESS LEFT 10/03/2015 Jacqulynn Cadet, MD  [facility]  . IR GENERIC HISTORICAL  10/04/2015   IR THROMBECT VENO MECH MOD SED 10/04/2015 Sandi Mariscal, MD [facility]  . IR GENERIC HISTORICAL  10/04/2015   IR TRANSCATH PLC STENT 1ST ART NOT LE CV CAR VERT CAR 10/04/2015 Sandi Mariscal, MD [facility]  . IR GENERIC HISTORICAL  10/04/2015   IR THROMB F/U EVAL ART/VEN FINAL DAY (MS) 10/04/2015 Sandi Mariscal, MD [facility]  . IR GENERIC HISTORICAL  11/02/2015   IR RADIOLOGIST EVAL & MGMT 11/02/2015 Sandi Mariscal, MD [facility]  . IR RADIOLOGIST EVAL & MGMT  04/26/2016    Social History:  reports that she has never smoked. She has never used smokeless tobacco. She reports that she does not drink alcohol or use drugs.  Allergies:  Allergies  Allergen Reactions  . Lipitor  [Atorvastatin Calcium] Rash  . Metformin And Related Itching, Swelling and Other (See Comments)    Leg  pain & swelling in legs  . Atorvastatin Itching and Rash  . Levofloxacin Itching    Medications Prior to Admission  Medication Sig Dispense Refill  . albuterol (PROVENTIL HFA;VENTOLIN HFA) 108 (90 BASE) MCG/ACT inhaler Inhale 2 puffs into the lungs every 6 (six) hours as needed for wheezing or shortness of breath.    Marland Kitchen albuterol (PROVENTIL) (2.5 MG/3ML) 0.083% nebulizer solution Take 2.5 mg by nebulization every 6 (six) hours as needed for wheezing or shortness of breath.     Marland Kitchen amitriptyline (ELAVIL) 10 MG tablet Take 10 mg by mouth daily.    . Capsaicin (MUSCLE RELIEF EX) Apply 1 application topically 3 (three) times daily as needed. For pain    . carvedilol (COREG) 12.5 MG tablet Take 12.5 mg by mouth 2 (two) times daily with a meal.     . ferrous sulfate 300 (60 Fe) MG/5ML syrup Take 300 mg by mouth daily with lunch.    . furosemide (LASIX) 80 MG tablet TAKE 1 TABLET TWICE DAILY (Patient taking differently: Take 80 mg by mouth 2 (two) times daily. ) 180 tablet 1  . glipiZIDE (GLUCOTROL) 5 MG tablet Take 5 mg by mouth daily before breakfast.     . isosorbide  mononitrate (IMDUR) 30 MG 24 hr tablet TAKE 1 TABLET (30 MG TOTAL) BY MOUTH DAILY. 90 tablet 2  . ketoconazole (NIZORAL) 2 % cream Apply 1 application topically 2 (two) times daily as needed (for skin irritation).     Marland Kitchen latanoprost (XALATAN) 0.005 % ophthalmic solution Place 1 drop into both eyes at bedtime.    Marland Kitchen LEVEMIR FLEXTOUCH 100 UNIT/ML Pen Inject 15-20 Units into the skin at bedtime. Patient states she takes every other day in the pm.  Patient takes 15-20 units if glucose is greater than 140 per patient    . lisinopril (PRINIVIL,ZESTRIL) 10 MG tablet Take 10 mg by mouth daily.     . methocarbamol (ROBAXIN) 500 MG tablet Take 1 tablet (500 mg total) by mouth every 8 (eight) hours as needed for muscle spasms. 45 tablet 0  . nitroGLYCERIN (NITROSTAT) 0.4 MG SL tablet Place 0.4 mg under the tongue every 5 (five) minutes as needed for chest pain.    Marland Kitchen NOVOLOG FLEXPEN 100 UNIT/ML FlexPen Inject 30-50 Units into the skin See admin instructions. 50 units every morning, 25 units at lunch and 50 units at supper *Hold for low blood sugar*    . oxybutynin (DITROPAN-XL) 10 MG 24 hr tablet Take 10 mg by mouth daily.    . Potassium Chloride ER 20 MEQ TBCR Take 20 mEq by mouth daily.    . rosuvastatin (CRESTOR) 20 MG tablet Take 1 tablet (20 mg total) by mouth daily at 6 PM. 30 tablet 2  . Semaglutide (OZEMPIC, 0.25 OR 0.5 MG/DOSE, [place]) Inject 0.5 mg into the skin once a week.     . spironolactone (ALDACTONE) 25 MG tablet Take 25 mg by mouth daily.    Marland Kitchen tolterodine (DETROL LA) 4 MG 24 hr capsule Take 4 mg by mouth daily.    . traMADol (ULTRAM) 50 MG tablet Take 50 mg by mouth 2 (two) times daily.    . Vitamin D, Ergocalciferol, (DRISDOL) 1.25 MG (50000 UT) CAPS capsule Take 1 capsule (50,000 Units total) by mouth every 7 (seven) days. 4 capsule 0  . XARELTO 20 MG TABS tablet Take 20 mg by mouth daily.       Blood pressure 132/70, pulse 95, temperature 98.1 F (36.7 C),  temperature source Oral, resp. rate  20, height 5' 3"  (1.6 m), weight 103.9 kg, SpO2 97 %. Physical Exam: Physical Exam Constitutional:      General: She is not in acute distress.    Appearance: She is obese. She is not ill-appearing, toxic-appearing or diaphoretic.     Comments: This is a chronically ill female in no acute distress.  She has ongoing abdominal pain but is eating a regular diet.  She describes the abdominal pain as diffuse mid abdominal pain.  HENT:     Head: Normocephalic and atraumatic.     Mouth/Throat:     Pharynx: Oropharynx is clear.  Eyes:     General: No scleral icterus.    Conjunctiva/sclera: Conjunctivae normal.     Comments: Pupils are equal  Neck:     Vascular: No carotid bruit.  Cardiovascular:     Rate and Rhythm: Normal rate and regular rhythm.     Heart sounds: Normal heart sounds.  Pulmonary:     Effort: Pulmonary effort is normal.     Breath sounds: Normal breath sounds.     Comments: Chest is clear anterior to auscultation. Abdominal:     General: There is no distension.     Palpations: Abdomen is soft.     Tenderness: There is abdominal tenderness (She describes diffuse abdominal tenderness over the mid abdomen.).  Musculoskeletal:     Cervical back: Normal range of motion and neck supple. No rigidity or tenderness.  Lymphadenopathy:     Cervical: No cervical adenopathy.  Skin:    General: Skin is warm and dry.     Capillary Refill: Capillary refill takes 2 to 3 seconds.  Neurological:     General: No focal deficit present.     Mental Status: She is alert and oriented to person, place, and time.     Cranial Nerves: No cranial nerve deficit.  Psychiatric:        Mood and Affect: Mood normal.        Behavior: Behavior normal.        Thought Content: Thought content normal.        Judgment: Judgment normal.     Results for orders placed or performed during the hospital encounter of 01/12/19 (from the past 48 hour(s))  CBG monitoring, ED     Status: Abnormal   Collection  Time: 01/13/19  4:45 PM  Result Value Ref Range   Glucose-Capillary 321 (H) 70 - 99 mg/dL  MRSA PCR Screening     Status: None   Collection Time: 01/13/19  5:43 PM   Specimen: Nasopharyngeal  Result Value Ref Range   MRSA by PCR NEGATIVE NEGATIVE    Comment:        The GeneXpert MRSA Assay (FDA approved for NASAL specimens only), is one component of a comprehensive MRSA colonization surveillance program. It is not intended to diagnose MRSA infection nor to guide or monitor treatment for MRSA infections. Performed at [facility], [place] [address] [place], [place] [address]   Glucose, capillary     Status: Abnormal   Collection Time: 01/13/19  5:48 PM  Result Value Ref Range   Glucose-Capillary 359 (H) 70 - 99 mg/dL   Comment 1 Notify RN    Comment 2 Document in Chart   Glucose, capillary     Status: Abnormal   Collection Time: 01/13/19  6:50 PM  Result Value Ref Range   Glucose-Capillary 374 (H) 70 - 99 mg/dL   Comment  1 Notify RN    Comment 2 Document in Chart   Glucose, capillary     Status: Abnormal   Collection Time: 01/13/19  7:52 PM  Result Value Ref Range   Glucose-Capillary 337 (H) 70 - 99 mg/dL  Glucose, capillary     Status: Abnormal   Collection Time: 01/13/19  8:57 PM  Result Value Ref Range   Glucose-Capillary 268 (H) 70 - 99 mg/dL  Glucose, capillary     Status: Abnormal   Collection Time: 01/13/19  9:58 PM  Result Value Ref Range   Glucose-Capillary 254 (H) 70 - 99 mg/dL  Glucose, capillary     Status: Abnormal   Collection Time: 01/13/19 11:05 PM  Result Value Ref Range   Glucose-Capillary 261 (H) 70 - 99 mg/dL  APTT     Status: Abnormal   Collection Time: 01/13/19 11:41 PM  Result Value Ref Range   aPTT 118 (H) 24 - 36 seconds    Comment:        IF BASELINE aPTT IS ELEVATED, SUGGEST PATIENT RISK ASSESSMENT BE USED TO DETERMINE APPROPRIATE ANTICOAGULANT THERAPY. Performed at [facility], [place] [address], [place], [place] [address]   Heparin level (unfractionated)     Status: None   Collection Time: 01/13/19 11:41 PM  Result Value Ref Range   Heparin Unfractionated 0.65 0.30 - 0.70 IU/mL    Comment: (NOTE) If heparin results are below expected values, and patient dosage has  been confirmed, suggest follow up testing of antithrombin III levels. Performed at [facility], [place] [address] [place], [place] [address]   Glucose, capillary     Status: Abnormal   Collection Time: 01/14/19 12:25 AM  Result Value Ref Range   Glucose-Capillary 228 (H) 70 - 99 mg/dL  Glucose, capillary     Status: Abnormal   Collection Time: 01/14/19  1:24 AM  Result Value Ref Range   Glucose-Capillary 276 (H) 70 - 99 mg/dL   Comment 1 Notify RN    Comment 2 Document in Chart   Comprehensive metabolic panel     Status: Abnormal   Collection Time: 01/14/19  1:40 AM  Result Value Ref Range   Sodium 137 135 - 145 mmol/L   Potassium 4.7 3.5 - 5.1 mmol/L   Chloride 107 98 - 111 mmol/L   CO2 19 (L) 22 - 32 mmol/L   Glucose, Bld 259 (H) 70 - 99 mg/dL   BUN 39 (H) 8 - 23 mg/dL   Creatinine, Ser 1.83 (H) 0.44 - 1.00 mg/dL   Calcium 8.2 (L) 8.9 - 10.3 mg/dL   Total Protein 6.8 6.5 - 8.1 g/dL   Albumin 3.0 (L) 3.5 - 5.0 g/dL   AST 153 (H) 15 - 41 U/L   ALT 265 (H) 0 - 44 U/L   Alkaline Phosphatase 131 (H) 38 - 126 U/L   Total Bilirubin 2.5 (H) 0.3 - 1.2 mg/dL   GFR calc non Af Amer 28 (L) >60 mL/min   GFR calc Af Amer 32 (L) >60 mL/min   Anion gap 11 5 - 15    Comment: Performed at [facility], [place] [address], [place], [place] [address]  CBC with Differential/Platelet     Status: Abnormal   Collection Time: 01/14/19  1:40 AM  Result Value Ref Range   WBC 14.2 (H) 4.0 - 10.5 K/uL    Comment: WHITE COUNT CONFIRMED ON SMEAR   RBC 4.25 3.87 - 5.11 MIL/uL  Hemoglobin 13.7 12.0 - 15.0 g/dL   HCT 43.2 36.0 - 46.0 %   MCV 101.6 (H) 80.0 - 100.0 fL   MCH 32.2 26.0 - 34.0 pg    MCHC 31.7 30.0 - 36.0 g/dL   RDW 13.6 11.5 - 15.5 %   Platelets 146 (L) 150 - 400 K/uL   nRBC 0.0 0.0 - 0.2 %   Neutrophils Relative % 82 %   Neutro Abs 11.6 (H) 1.7 - 7.7 K/uL   Lymphocytes Relative 9 %   Lymphs Abs 1.3 0.7 - 4.0 K/uL   Monocytes Relative 8 %   Monocytes Absolute 1.1 (H) 0.1 - 1.0 K/uL   Eosinophils Relative 0 %   Eosinophils Absolute 0.0 0.0 - 0.5 K/uL   Basophils Relative 0 %   Basophils Absolute 0.1 0.0 - 0.1 K/uL   WBC Morphology DOHLE BODIES    Immature Granulocytes 1 %   Abs Immature Granulocytes 0.15 (H) 0.00 - 0.07 K/uL   Polychromasia PRESENT     Comment: Performed at [facility], [place] [address] [place], [place] [address]  Lactic acid, plasma     Status: Abnormal   Collection Time: 01/14/19  1:40 AM  Result Value Ref Range   Lactic Acid, Venous 2.5 (HH) 0.5 - 1.9 mmol/L    Comment: CRITICAL VALUE NOTED.  VALUE IS CONSISTENT WITH PREVIOUSLY REPORTED AND CALLED VALUE. Performed at [facility], [place] [address], [place], [place] [address]   Glucose, capillary     Status: Abnormal   Collection Time: 01/14/19  2:22 AM  Result Value Ref Range   Glucose-Capillary 238 (H) 70 - 99 mg/dL   Comment 1 Notify RN    Comment 2 Document in Chart   Glucose, capillary     Status: Abnormal   Collection Time: 01/14/19  3:17 AM  Result Value Ref Range   Glucose-Capillary 215 (H) 70 - 99 mg/dL  Heparin level (unfractionated)     Status: None   Collection Time: 01/14/19  3:52 AM  Result Value Ref Range   Heparin Unfractionated 0.47 0.30 - 0.70 IU/mL    Comment: (NOTE) If heparin results are below expected values, and patient dosage has  been confirmed, suggest follow up testing of antithrombin III levels. Performed at [facility], [place] [address] [place], [place] [address]   Glucose, capillary     Status: Abnormal   Collection Time: 01/14/19  4:21 AM  Result Value Ref Range   Glucose-Capillary 197 (H)  70 - 99 mg/dL   Comment 1 Notify RN    Comment 2 Document in Chart   Glucose, capillary     Status: Abnormal   Collection Time: 01/14/19  5:22 AM  Result Value Ref Range   Glucose-Capillary 224 (H) 70 - 99 mg/dL   Comment 1 Notify RN    Comment 2 Document in Chart   Glucose, capillary     Status: Abnormal   Collection Time: 01/14/19  6:18 AM  Result Value Ref Range   Glucose-Capillary 233 (H) 70 - 99 mg/dL  Glucose, capillary     Status: Abnormal   Collection Time: 01/14/19  7:26 AM  Result Value Ref Range   Glucose-Capillary 191 (H) 70 - 99 mg/dL  Glucose, capillary     Status: Abnormal   Collection Time: 01/14/19  8:54 AM  Result Value Ref Range   Glucose-Capillary 219 (H) 70 - 99 mg/dL  Glucose, capillary     Status: Abnormal  Collection Time: 01/14/19  9:21 AM  Result Value Ref Range   Glucose-Capillary 233 (H) 70 - 99 mg/dL  Glucose, capillary     Status: Abnormal   Collection Time: 01/14/19 11:38 AM  Result Value Ref Range   Glucose-Capillary 254 (H) 70 - 99 mg/dL  Glucose, capillary     Status: Abnormal   Collection Time: 01/14/19  4:31 PM  Result Value Ref Range   Glucose-Capillary 294 (H) 70 - 99 mg/dL  Glucose, capillary     Status: Abnormal   Collection Time: 01/14/19  9:35 PM  Result Value Ref Range   Glucose-Capillary 392 (H) 70 - 99 mg/dL  Heparin level (unfractionated)     Status: Abnormal   Collection Time: 01/15/19  2:04 AM  Result Value Ref Range   Heparin Unfractionated 0.23 (L) 0.30 - 0.70 IU/mL    Comment: (NOTE) If heparin results are below expected values, and patient dosage has  been confirmed, suggest follow up testing of antithrombin III levels. Performed at [facility], [place] [address] [place], [place] [address]   CBC Once     Status: Abnormal   Collection Time: 01/15/19  2:04 AM  Result Value Ref Range   WBC 11.7 (H) 4.0 - 10.5 K/uL   RBC 3.87 3.87 - 5.11 MIL/uL   Hemoglobin 12.3 12.0 - 15.0 g/dL   HCT 39.3 36.0 -  46.0 %   MCV 101.6 (H) 80.0 - 100.0 fL   MCH 31.8 26.0 - 34.0 pg   MCHC 31.3 30.0 - 36.0 g/dL   RDW 13.5 11.5 - 15.5 %   Platelets 120 (L) 150 - 400 K/uL   nRBC 0.0 0.0 - 0.2 %    Comment: Performed at [facility], [place] [address] [place], [place] [address]  Comprehensive metabolic panel Once     Status: Abnormal   Collection Time: 01/15/19  2:04 AM  Result Value Ref Range   Sodium 134 (L) 135 - 145 mmol/L   Potassium 4.5 3.5 - 5.1 mmol/L   Chloride 104 98 - 111 mmol/L   CO2 20 (L) 22 - 32 mmol/L   Glucose, Bld 313 (H) 70 - 99 mg/dL   BUN 33 (H) 8 - 23 mg/dL   Creatinine, Ser 1.58 (H) 0.44 - 1.00 mg/dL   Calcium 8.1 (L) 8.9 - 10.3 mg/dL   Total Protein 6.4 (L) 6.5 - 8.1 g/dL   Albumin 2.7 (L) 3.5 - 5.0 g/dL   AST 71 (H) 15 - 41 U/L   ALT 167 (H) 0 - 44 U/L   Alkaline Phosphatase 109 38 - 126 U/L   Total Bilirubin 1.6 (H) 0.3 - 1.2 mg/dL   GFR calc non Af Amer 33 (L) >60 mL/min   GFR calc Af Amer 39 (L) >60 mL/min   Anion gap 10 5 - 15    Comment: Performed at [facility], [place] [address] [place], [place] [address]  Lipase, blood     Status: Abnormal   Collection Time: 01/15/19  2:04 AM  Result Value Ref Range   Lipase 359 (H) 11 - 51 U/L    Comment: Performed at [facility], [place] [address] [place], [place] [address]  Lactic acid, plasma     Status: None   Collection Time: 01/15/19  2:04 AM  Result Value Ref Range   Lactic Acid, Venous 1.9 0.5 - 1.9 mmol/L    Comment: Performed at [facility], [place]  [address], [place], [place] [address]  Glucose, capillary     Status: Abnormal   Collection Time: 01/15/19  7:42 AM  Result Value Ref Range   Glucose-Capillary 298 (H) 70 - 99 mg/dL  Glucose, capillary     Status: Abnormal   Collection Time: 01/15/19 11:53 AM  Result Value Ref Range   Glucose-Capillary 284 (H) 70 - 99 mg/dL  Heparin level (unfractionated)     Status: None   Collection Time:  01/15/19  1:52 PM  Result Value Ref Range   Heparin Unfractionated 0.43 0.30 - 0.70 IU/mL    Comment: (NOTE) If heparin results are below expected values, and patient dosage has  been confirmed, suggest follow up testing of antithrombin III levels. Performed at [facility], [place] [address] [place], [place] [address]    No results found. . sodium chloride 75 mL/hr at 01/15/19 1508  . heparin 1,300 Units/hr (01/15/19 1400)   Anti-infectives (From admission, onward)   Start     Dose/Rate Route Frequency Ordered Stop   01/15/19 1400  cephALEXin (KEFLEX) capsule 500 mg     500 mg Oral Every 12 hours 01/15/19 1357 01/18/19 0959   01/14/19 1400  cefTRIAXone (ROCEPHIN) 1 g in sodium chloride 0.9 % 100 mL IVPB  Status:  Discontinued     1 g 200 mL/hr over 30 Minutes Intravenous Every 24 hours 01/14/19 1206 01/15/19 1357   01/13/19 2200  cefTRIAXone (ROCEPHIN) 2 g in sodium chloride 0.9 % 100 mL IVPB  Status:  Discontinued     2 g 200 mL/hr over 30 Minutes Intravenous Every 24 hours 01/12/19 2338 01/13/19 1400   01/12/19 2345  metroNIDAZOLE (FLAGYL) IVPB 500 mg  Status:  Discontinued     500 mg 100 mL/hr over 60 Minutes Intravenous Every 8 hours 01/12/19 2338 01/13/19 1400   01/12/19 2300  cefTRIAXone (ROCEPHIN) 2 g in sodium chloride 0.9 % 100 mL IVPB     2 g 200 mL/hr over 30 Minutes Intravenous  Once 01/12/19 2247 01/13/19 0023   01/12/19 2245  cefTRIAXone (ROCEPHIN) 1 g in sodium chloride 0.9 % 100 mL IVPB  Status:  Discontinued     1 g 200 mL/hr over 30 Minutes Intravenous  Once 01/12/19 2242 01/12/19 2247       Assessment/Plan Coronary artery disease with elevated troponin's/demand ischemia? Hx CABG Ischemic cardiomyopathy with a EF 35% Hx V. tach with AICD DKA/type 2 diabetes DVT on chronic anticoagulation Hypertension Hyperlipidemia BMI of 40.57  Gallstone pancreatitis  FEN: IV fluids/soft diet ordered. ID: Rocephin 1/4-1/7; Keflex 1/7 >> DVT:  Heparin drip Follow-up: TBD  Plan: Patient with pancreatitis and gallstones.  She has been seen by GI and they have advanced her diet.  She is still taking morphine 5 times yesterday, 4 times today, along with some hydrocodone and tramadol.   She is afebrile and her vital signs are stable.  Her glucose is 313-284 range today.  Creatinine 1.58, lipase 359, AST ALT are improving.  WBC is 11.7 H/H 12.3/39.3, platelets 120,000.  She also continues to complain of diffuse mid abdominal discomfort, and discomfort with any palpation.  Cardiology is seeing and believes her elevated troponins are secondary to demand ischemia.  They are tentatively planning outpatient ischemic evaluation, but this needs to be completed prior to any surgery.  I will order a abdominal ultrasound for tomorrow.  We would recommend she go back on clear liquids until her pancreatitis is completely resolved.    She is  chronically sedentary from her heart disease, and a poor surgical candidate. Once she is medically stable will consider for laparoscopic cholecystectomy. We will follow with you.    Earnstine Regal [facility] 01/15/2019, 3:41 PM Please see Amion for pager number during day hours 7:00am-4:30pm

## 2019-01-15 NOTE — Evaluation (Signed)
Physical Therapy Evaluation Patient Details Name: [name] MRN: [id number] DOB: 05-22-1950 Today's Date: 01/15/2019   History of Present Illness  69year old female admitted for acute pancreatitis.  PMH:  CHF/ICM, home 02, CAD, CABG, ICD, DM, HTN, OSA, and DVT  Clinical Impression  Pt admitted with above diagnosis.  Pt currently with functional limitations due to the deficits listed below (see PT Problem List). Pt will benefit from skilled PT to increase their independence and safety with mobility to allow discharge to the venue listed below.   Pt slow to mobilize however able to ambulate short distance and remained up in recliner end of session.  Recommend initial supervision for safety upon d/c and HHPT.     Follow Up Recommendations Home health PT;Supervision/Assistance - 24 hour    Equipment Recommendations  None recommended by PT    Recommendations for Other Services       Precautions / Restrictions Precautions Precautions: Fall Precaution Comments: chronic 2L O2 [place]      Mobility  Bed Mobility Overal bed mobility: Needs Assistance Bed Mobility: Supine to Sit     Supine to sit: Min assist     General bed mobility comments: slight assist for trunk upright, increased time and effort  Transfers Overall transfer level: Needs assistance Equipment used: Rolling walker (2 wheeled) Transfers: Sit to/from Stand Sit to Stand: Min assist         General transfer comment: slight assist to steady with rise, improved to min/guard from [facility] (used upon return to room)  Ambulation/Gait Ambulation/Gait assistance: Min guard Gait Distance (Feet): 60 Feet Assistive device: Rolling walker (2 wheeled) Gait Pattern/deviations: Step-through pattern;Decreased stride length     General Gait Details: pt remained on 3L O2 [place] and SPO2 89-95% during session session, HR 106-116, pt slow but steady with RW, distance to tolerance  Stairs            Wheelchair Mobility     Modified Rankin (Stroke Patients Only)       Balance Overall balance assessment: Needs assistance         Standing balance support: Bilateral upper extremity supported Standing balance-Leahy Scale: Poor                               Pertinent Vitals/Pain Pain Assessment: Faces Faces Pain Scale: Hurts a little bit Pain Location: back Pain Descriptors / Indicators: Aching Pain Intervention(s): Repositioned;Monitored during session    Home Living Family/patient expects to be discharged to:: Private residence Living Arrangements: Children Available Help at Discharge: Family;Personal care attendant Type of Home: House Home Access: Stairs to enter Entrance Stairs-Rails: None Entrance Stairs-Number of Steps: 3 Home Layout: One level Home Equipment: Grab bars - toilet;Shower seat;Walker - 4 wheels      Prior Function Level of Independence: Needs assistance   Gait / Transfers Assistance Needed: uses rollator     Comments: gets to bathroom on her own and manages toileting.  PCA 7 days a week for adls     Hand Dominance        Extremity/Trunk Assessment        Lower Extremity Assessment Lower Extremity Assessment: Generalized weakness       Communication   Communication: No difficulties  Cognition Arousal/Alertness: Awake/alert Behavior During Therapy: Flat affect Overall Cognitive Status: Within Functional Limits for tasks assessed  General Comments      Exercises     Assessment/Plan    PT Assessment Patient needs continued PT services  PT Problem List Decreased strength;Decreased mobility;Decreased activity tolerance;Decreased balance;Cardiopulmonary status limiting activity;Decreased knowledge of use of DME       PT Treatment Interventions DME instruction;Therapeutic activities;Gait training;Therapeutic exercise;Functional mobility training;Patient/family education;Balance  training    PT Goals (Current goals can be found in the Care Plan section)  Acute Rehab PT Goals PT Goal Formulation: With patient Time For Goal Achievement: 01/29/19 Potential to Achieve Goals: Fair    Frequency Min 3X/week   Barriers to discharge        Co-evaluation               AM-PAC PT "6 Clicks" Mobility  Outcome Measure Help needed turning from your back to your side while in a flat bed without using bedrails?: A Little Help needed moving from lying on your back to sitting on the side of a flat bed without using bedrails?: A Little Help needed moving to and from a bed to a chair (including a wheelchair)?: A Little Help needed standing up from a chair using your arms (e.g., wheelchair or bedside chair)?: A Little Help needed to walk in hospital room?: A Little Help needed climbing 3-5 steps with a railing? : A Lot 6 Click Score: 17    End of Session Equipment Utilized During Treatment: Gait belt Activity Tolerance: Patient tolerated treatment well Patient left: with call bell/phone within reach;in chair Nurse Communication: Mobility status PT Visit Diagnosis: Other abnormalities of gait and mobility (R26.89)    [id number] PT Time Calculation (min) (ACUTE ONLY): 24 min   Charges:   PT Evaluation $PT Eval Low Complexity: 1 Low          Kati PT, DPT Acute Rehabilitation Services Office: [phone]  Trena Platt 01/15/2019, 12:51 PM

## 2019-01-15 NOTE — Progress Notes (Signed)
Brief pharmacy anti-coagulation note:  For full details see note from Arlyn Dunning, PharmD from earlier today  Assessment and Plan 1352 HL=0.43 therapeutic No bleeding or line issues per RN Continue heparin drip at 1300 units/hr Check confirmatory level in 6 hours  Dolly Rias RPh 01/15/2019, 3:30 PM

## 2019-01-16 ENCOUNTER — Inpatient Hospital Stay (HOSPITAL_COMMUNITY): Payer: Medicare HMO

## 2019-01-16 DIAGNOSIS — I248 Other forms of acute ischemic heart disease: Secondary | ICD-10-CM

## 2019-01-16 LAB — COMPREHENSIVE METABOLIC PANEL
ALT: 114 U/L — ABNORMAL HIGH (ref 0–44)
AST: 34 U/L (ref 15–41)
Albumin: 2.4 g/dL — ABNORMAL LOW (ref 3.5–5.0)
Alkaline Phosphatase: 95 U/L (ref 38–126)
Anion gap: 7 (ref 5–15)
BUN: 30 mg/dL — ABNORMAL HIGH (ref 8–23)
CO2: 23 mmol/L (ref 22–32)
Calcium: 8.4 mg/dL — ABNORMAL LOW (ref 8.9–10.3)
Chloride: 109 mmol/L (ref 98–111)
Creatinine, Ser: 1.43 mg/dL — ABNORMAL HIGH (ref 0.44–1.00)
GFR calc Af Amer: 44 mL/min — ABNORMAL LOW (ref >60)
GFR calc non Af Amer: 38 mL/min — ABNORMAL LOW (ref >60)
Glucose, Bld: 211 mg/dL — ABNORMAL HIGH (ref 70–99)
Potassium: 4.3 mmol/L (ref 3.5–5.1)
Sodium: 139 mmol/L (ref 135–145)
Total Bilirubin: 1 mg/dL (ref 0.3–1.2)
Total Protein: 6.3 g/dL — ABNORMAL LOW (ref 6.5–8.1)

## 2019-01-16 LAB — CBC
HCT: 35 % — ABNORMAL LOW (ref 36.0–46.0)
Hemoglobin: 11.3 g/dL — ABNORMAL LOW (ref 12.0–15.0)
MCH: 32.3 pg (ref 26.0–34.0)
MCHC: 32.3 g/dL (ref 30.0–36.0)
MCV: 100 fL (ref 80.0–100.0)
Platelets: 117 10*3/uL — ABNORMAL LOW (ref 150–400)
RBC: 3.5 MIL/uL — ABNORMAL LOW (ref 3.87–5.11)
RDW: 13.6 % (ref 11.5–15.5)
WBC: 12.5 10*3/uL — ABNORMAL HIGH (ref 4.0–10.5)
nRBC: 0 % (ref 0.0–0.2)

## 2019-01-16 LAB — GLUCOSE, CAPILLARY
Glucose-Capillary: 212 mg/dL — ABNORMAL HIGH (ref 70–99)
Glucose-Capillary: 212 mg/dL — ABNORMAL HIGH (ref 70–99)
Glucose-Capillary: 171 mg/dL — ABNORMAL HIGH (ref 70–99)
Glucose-Capillary: 195 mg/dL — ABNORMAL HIGH (ref 70–99)

## 2019-01-16 LAB — HEPARIN LEVEL (UNFRACTIONATED): Heparin Unfractionated: 0.37 IU/mL (ref 0.30–0.70)

## 2019-01-16 MED ORDER — METHOCARBAMOL 500 MG PO TABS
500.0000 mg | ORAL_TABLET | Freq: Four times a day (QID) | ORAL | Status: AC | PRN
  Administered 2019-01-16 – 2019-01-17 (×2): 500 mg via ORAL
  Filled 2019-01-16 (×2): qty 1

## 2019-01-16 NOTE — Progress Notes (Signed)
Progress Note  Patient Name: [name] Date of Encounter: 01/16/2019  Primary Cardiologist: Jamie Furbish, MD   Subjective   Feeling poorly.  Increased abdominal pain today.  Diet was regressed to liquids.    Inpatient Medications    Scheduled Meds: . carvedilol  12.5 mg Oral BID WC  . cephALEXin  500 mg Oral Q12H  . Chlorhexidine Gluconate Cloth  6 each Topical Daily  . insulin aspart  0-5 Units Subcutaneous QHS  . insulin aspart  0-9 Units Subcutaneous TID WC  . insulin aspart  5 Units Subcutaneous TID WC  . insulin glargine  35 Units Subcutaneous Daily  . isosorbide mononitrate  30 mg Oral Daily  . latanoprost  1 drop Both Eyes QHS  . mouth rinse  15 mL Mouth Rinse BID   Continuous Infusions: . sodium chloride 75 mL/hr at 01/16/19 0810  . heparin 1,300 Units/hr (01/15/19 2310)   PRN Meds: dextrose, HYDROcodone-acetaminophen, ipratropium-albuterol, morphine injection, ondansetron (ZOFRAN) IV   Vital Signs    Vitals:   01/16/19 1000 01/16/19 1100 01/16/19 1200 01/16/19 1220  BP: 135/71  (!) 152/57   Pulse: 64 65 60 65  Resp: (!) 22 18 (!) 22 (!) 22  Temp:   98.3 F (36.8 C)   TempSrc:   Axillary   SpO2: 96% 96% 97% 97%  Weight:      Height:        Intake/Output Summary (Last 24 hours) at 01/16/2019 1301 Last data filed at 01/16/2019 1200 Gross per 24 hour  Intake 1803.71 ml  Output 500 ml  Net 1303.71 ml   Last 3 Weights 01/12/2019 12/30/2018 12/09/2018  Weight (lbs) 229 lb 229 lb 234 lb  Weight (kg) 103.874 kg 103.874 kg 106.142 kg      Telemetry    Sinus rhythm.  Rare PVCs.  - Personally Reviewed  ECG    n/a - Personally Reviewed  Physical Exam   VS:  BP (!) 152/57   Pulse 65   Temp 98.3 F (36.8 C) (Axillary)   Resp (!) 22   Ht 5\' 3"  (1.6 m)   Wt 103.9 kg   SpO2 97%   BMI 40.57 kg/m  , BMI Body mass index is 40.57 kg/m. GENERAL:  Ill-appearing HEENT: Pupils equal round and reactive, fundi not visualized, oral mucosa  unremarkable NECK:  No jugular venous distention, waveform within normal limits, carotid upstroke brisk and symmetric, no bruits LUNGS:  Clear to auscultation bilaterally HEART:  RRR.  PMI not displaced or sustained,S1 and S2 within normal limits, no S3, no S4, no clicks, no rubs, no murmurs ABD:  Obese.  +Epigastric TTP. EXT:  2 plus pulses throughout, no edema, no cyanosis no clubbing SKIN:  No rashes no nodules NEURO:  Cranial nerves II through XII grossly intact, motor grossly intact throughout PSYCH:  Cognitively intact, oriented to person place and time   Labs    High Sensitivity Troponin:   Recent Labs  Lab 01/12/19 1857 01/13/19 0651 01/13/19 0849 01/13/19 1044 01/13/19 1356  TROPONINIHS 86* 688* 843* 1,023* 1,023*      Chemistry Recent Labs  Lab 01/14/19 0140 01/15/19 0204 01/16/19 0214  NA 137 134* 139  K 4.7 4.5 4.3  CL 107 104 109  CO2 19* 20* 23  GLUCOSE 259* 313* 211*  BUN 39* 33* 30*  CREATININE 1.83* 1.58* 1.43*  CALCIUM 8.2* 8.1* 8.4*  PROT 6.8 6.4* 6.3*  ALBUMIN 3.0* 2.7* 2.4*  AST 153* 71* 34  ALT 265* 167* 114*  ALKPHOS 131* 109 95  BILITOT 2.5* 1.6* 1.0  GFRNONAA 28* 33* 38*  GFRAA 32* 39* 44*  ANIONGAP 11 10 7      Hematology Recent Labs  Lab 01/14/19 0140 01/15/19 0204 01/16/19 0214  WBC 14.2* 11.7* 12.5*  RBC 4.25 3.87 3.50*  HGB 13.7 12.3 11.3*  HCT 43.2 39.3 35.0*  MCV 101.6* 101.6* 100.0  MCH 32.2 31.8 32.3  MCHC 31.7 31.3 32.3  RDW 13.6 13.5 13.6  PLT 146* 120* 117*    BNP Recent Labs  Lab 01/12/19 1857  BNP 88.3     DDimer No results for input(s): DDIMER in Jamie last 168 hours.   Radiology    No results found.  Cardiac Studies   Echo 01/13/19: IMPRESSIONS   1. Suboptimal image quality for diagnosis of biventricular function and wall motion.  2. Left ventricular ejection fraction, by visual estimation, is 50%. Jamie left ventricle has limited views for evaluation of function. Possible apical wall motion  abnormality. There is mildly increased left ventricular hypertrophy.  3. Global right ventricle was not well visualized.Jamie right ventricular size is not well visualized. Right vetricular wall thickness was not assessed.  4. Left atrial size was not well visualized.  5. Right atrial size was not well visualized.  6. Mild mitral annular calcification.  7. Jamie mitral valve is abnormal. No evidence of mitral valve regurgitation. No evidence of mitral stenosis.  8. Jamie tricuspid valve is not well visualized.  9. Jamie aortic valve was not well visualized. Aortic valve regurgitation is not visualized. No evidence of aortic valve sclerosis or stenosis.  Patient Profile     [name] is a 7F with CAD s/p CABG in [id number], chronic systolic and diastolic heart failure LVEF 40 to 45%, VT status post ICD, diabetes, hypertension, OSA, morbid obesity, and OSA admitted with gallstone pancreatitis.  Cardiology consulted for elevated troponin.  Assessment & Plan    # Elevated troponin: Troponin elevated from 688-->843--1023.  She has no chest pain.  This is likely demand ischemia.  Echo revealed LVEF 50%, up from 40-45% previously.  Given that surgical options are being considered, we will get a [name].  If possible this will be added on today.  Otherwise, it will be tomorrow.   # Chronic systolic and diastolic heart failure: Hold home lasix and spironolactone as she is receiving IV fluids.  Recommend being judicious with volume resuscitation.  Continue carvedilol and Imdur.  Consider an SGLT2 inhibitor as an outpatient.   # VT: S/p ICD.  Will give IV metoprolol given that she is NPO.  Device interrogation as above.  Not an active issue.  # Hypertension: Home lisinopril and spironolactone on hold.  # Hyperlipidemia: Continue rosuvastatin when taking oral medication.   # Prior DVT:  Xarelto on hold.  Continue heparin.       For questions or updates, please contact [place] Please  consult [email or web address] for contact info under        Signed, Jamie Latch, MD  01/16/2019, 1:01 PM

## 2019-01-16 NOTE — Progress Notes (Signed)
[place] for IV heparin Indication: Hx DVT  Allergies  Allergen Reactions  . Lipitor  [Atorvastatin Calcium] Rash  . Metformin And Related Itching, Swelling and Other (See Comments)    Leg pain & swelling in legs  . Atorvastatin Itching and Rash  . Levofloxacin Itching    Patient Measurements: Height: 5\' 3"  (160 cm) Weight: 229 lb (103.9 kg) IBW/kg (Calculated) : 52.4 Heparin Dosing Weight: 77 kg  Vital Signs: Temp: 98.2 F (36.8 C) (01/08 0400) Temp Source: Oral (01/08 0400) BP: 134/62 (01/08 0600) Pulse Rate: 65 (01/08 0600)  Labs: Recent Labs    01/13/19 0849 01/13/19 0849 01/13/19 1044 01/13/19 1356 01/13/19 2341 01/14/19 0140 01/15/19 0204 01/15/19 1352 01/15/19 1953 01/16/19 0214  HGB 15.2*  --   --   --   --  13.7 12.3  --   --  11.3*  HCT 46.9*  --   --   --   --  43.2 39.3  --   --  35.0*  PLT 162  --   --   --   --  146* 120*  --   --  117*  APTT 131*  --   --  100* 118*  --   --   --   --   --   HEPARINUNFRC  --    < >  --   --  0.65  --  0.23* 0.43 0.40 0.37  CREATININE 1.50*   < > 1.68*  --   --  1.83* 1.58*  --   --  1.43*  TROPONINIHS 843*  --  1,023* 1,023*  --   --   --   --   --   --    < > = values in this interval not displayed.    Estimated Creatinine Clearance: 43.4 mL/min (A) (by C-G formula based on SCr of 1.43 mg/dL (H)).  Medications:  PTA Xarelto 20mg  daily - Last dose 01/11/19 @ 22:00  Scheduled:  . carvedilol  12.5 mg Oral BID WC  . cephALEXin  500 mg Oral Q12H  . Chlorhexidine Gluconate Cloth  6 each Topical Daily  . insulin aspart  0-5 Units Subcutaneous QHS  . insulin aspart  0-9 Units Subcutaneous TID WC  . insulin aspart  5 Units Subcutaneous TID WC  . insulin glargine  35 Units Subcutaneous Daily  . isosorbide mononitrate  30 mg Oral Daily  . latanoprost  1 drop Both Eyes QHS  . mouth rinse  15 mL Mouth Rinse BID   Infusions:  . sodium chloride 75 mL/hr at 01/15/19 2200  .  heparin 1,300 Units/hr (01/15/19 2310)    Assessment: 82 yoF with extensive cardiac history including DVT on Xarelto, CAD s/p CABG 2007, HFrEF with AICD, HTN, HLD, DM, presents with n/v d/t pancreatitis. Pharmacy consulted to dose IV heparin while Xarelto on hold   Prior anticoagulation: Xarelto 20 mg daily, last dose 01/11/19  Significant events:  Today, 01/16/2019:  HL 0.37 therapeutic  Hgb and plts trending down - monitor closely  No bleeding or infusion issues noted  Goal of Therapy: Heparin level 0.3-0.7 units/ml Monitor platelets by anticoagulation protocol: Yes  Plan:  Continue heparin drip at 1300 units/hr  Daily CBC and heparin level  Monitor for signs of bleeding or thrombosis  F/u for ability to transition back to [place] 01/16/2019, 7:29 AM

## 2019-01-16 NOTE — Progress Notes (Signed)
Inpatient Diabetes Program Recommendations  AACE/ADA: New Consensus Statement on Inpatient Glycemic Control (2015)  Target Ranges:  Prepandial:   less than 140 mg/dL      Peak postprandial:   less than 180 mg/dL (1-2 hours)      Critically ill patients:  140 - 180 mg/dL    Results for SENIYAH, SCHRUPP (MRN [id number]) as of 01/16/2019 10:48  Ref. Range 01/15/2019 07:42 01/15/2019 11:53 01/15/2019 16:16 01/15/2019 21:29 01/16/2019 07:25  Glucose-Capillary Latest Ref Range: 70 - 99 mg/dL 298 (H) 284 (H) 299 (H) 234 (H) 212 (H)     Review of Glycemic Control Diabetes history: DM 2 Outpatient Diabetes medications: Glipizide 5 mg QD + Ozempic 0.5 mg Qweek + Levemir 15-20 units Every Other Day + Novolog 50 AM/ 25 Lunch/ 50 Dinner  Current orders for Inpatient glycemic control:  Lantus 35 units (started 1/7) Novolog 0-9 units + hs Novolog 5 units tid meal coverage  Inpatient Diabetes Program Recommendations:    Glucose trends better, however fasting glucose still 212.  Consider increasing Lantus to 40 units.  Thanks,  Tama Headings RN, MSN, BC-ADM Inpatient Diabetes Coordinator Team Pager [phone] (8a-5p)

## 2019-01-16 NOTE — Progress Notes (Signed)
    CC: Abdominal pain, nausea and vomiting, CBG 549  Subjective: Patient is in bed trying to get some response from her insurance company. She complains of some mid epigastric pain, but it seems better than yesterday.  She is still on a soft diet.  Still taking hydrocodone x 2 yesterday, morphine x 5, and tramadol x2 yesterday. Objective: Vital signs in last 24 hours: Temp:  [97.9 F (36.6 C)-98.7 F (37.1 C)] 97.9 F (36.6 C) (01/08 0853) Pulse Rate:  [65-97] 70 (01/08 0809) Resp:  [17-32] 17 (01/08 0600) BP: (127-172)/(59-82) 138/63 (01/08 0809) SpO2:  [94 %-98 %] 98 % (01/08 0600) Last BM Date: 01/12/19 P.o. not recorded 662 IV recorded Urine 500 recorded Afebrile vital signs are stable. Creatinine 1.43, glucose 211 WBC 12.5; lipase is pending LFTs continue to improve. H/H 11.3/35 Platelets 117,000 Abdominal ultrasound is pending. intake/Output from previous day: 01/07 0701 - 01/08 0700 In: 662.8 [I.V.:662.8] Out: 500 [Urine:500] Intake/Output this shift: No intake/output data recorded.  General appearance: alert, cooperative and no distress Resp: clear to auscultation bilaterally and Sats are stable on nasal cannula. GI: Soft, less tender than yesterday.  Still complaining of some mid epigastric abdominal pain.  Lab Results:  Recent Labs    01/15/19 0204 01/16/19 0214  WBC 11.7* 12.5*  HGB 12.3 11.3*  HCT 39.3 35.0*  PLT 120* 117*    BMET Recent Labs    01/15/19 0204 01/16/19 0214  NA 134* 139  K 4.5 4.3  CL 104 109  CO2 20* 23  GLUCOSE 313* 211*  BUN 33* 30*  CREATININE 1.58* 1.43*  CALCIUM 8.1* 8.4*   PT/INR No results for input(s): LABPROT, INR in the last 72 hours.  Recent Labs  Lab 01/12/19 1857 01/13/19 0651 01/14/19 0140 01/15/19 0204 01/16/19 0214  AST 819* 358* 153* 71* 34  ALT 589* 439* 265* 167* 114*  ALKPHOS 170* 156* 131* 109 95  BILITOT 2.1* 3.0* 2.5* 1.6* 1.0  PROT 7.8 7.3 6.8 6.4* 6.3*  ALBUMIN 3.5 3.1* 3.0* 2.7*  2.4*     Lipase     Component Value Date/Time   LIPASE 359 (H) 01/15/2019 0204     Medications: . carvedilol  12.5 mg Oral BID WC  . cephALEXin  500 mg Oral Q12H  . Chlorhexidine Gluconate Cloth  6 each Topical Daily  . insulin aspart  0-5 Units Subcutaneous QHS  . insulin aspart  0-9 Units Subcutaneous TID WC  . insulin aspart  5 Units Subcutaneous TID WC  . insulin glargine  35 Units Subcutaneous Daily  . isosorbide mononitrate  30 mg Oral Daily  . latanoprost  1 drop Both Eyes QHS  . mouth rinse  15 mL Mouth Rinse BID   . sodium chloride 75 mL/hr at 01/16/19 0810  . heparin 1,300 Units/hr (01/15/19 2310)    Assessment/Plan Coronary artery disease with elevated troponin's/demand ischemia? Hx CABG Ischemic cardiomyopathy with a EF 35% Hx V. tach with AICD DKA/type 2 diabetes DVT on chronic anticoagulation Hypertension Hyperlipidemia BMI of 40.57  Gallstone pancreatitis  FEN: IV fluids/soft diet ordered. ID: Rocephin 1/4-1/7; Keflex 1/7 >> DVT: Heparin drip Follow-up: TBD   Plan: Lipase and abdominal ultrasound are pending.  She will need cardiology/medical clearance for anesthesia/surgical procedure.       LOS: 4 days    Caspian Deleonardis 01/16/2019 Please see Amion

## 2019-01-16 NOTE — Progress Notes (Signed)
Progress Note    ASSESSMENT AND PLAN:   1. Acute pancreatitis, probably biliary in nature given cholelithiasis and abnormal liver tests. No bile duct dilationon CT scan. Not MRCP candidate. May have passed a stone.  --Got fairly aggressive fluid resuscitation (hx of heart failure limited replacement to some degree).  Now at 75 ml / hr . Should be able to discontinue soon. Net fluid balance in ~ 6,000 ml.  --Liver tests continue to improve. CCS saw, plans for cholecystectomy when medically stable.   --Pain better with Morphine, taking it anywhere from 4-5 doses daily at this point. Also, Hydrocodone  Q 6 hours prn was added yesterday.  --On soft diet. Says pain has not increased going from liquids to soft. Though abdomen not significantly tender on exam she did have pain meds around 5 am. Will keep on soft diet for now but low threshold for reducing to clears if worsening pain.  --WBC improving.  --Home diuretics on hold --Continue supportive care.  2. UTI, Proteus MIrabilis.  Currently on Keflex   3. AKI on CKD. Cr improving, down to 1.43 (at baseline now).   4. Elevated troponin, no chest pain. Cardiology following and feels likely demand ischemia but may get outpatient ischemic evaluation after resolution of acute issues.  5. Hx of DVT, home Xarelto on hold. Still on IV heparin.       SUBJECTIVE   Says abdominal pain is about the same as yesterday. NPO this am for [name] but apparently tolerated soft diet last night. She "spits" in emesis bag, denies nausea / vomiting.    OBJECTIVE:     Vital signs in last 24 hours: Temp:  [97.9 F (36.6 C)-98.7 F (37.1 C)] 97.9 F (36.6 C) (01/08 0853) Pulse Rate:  [64-97] 65 (01/08 1100) Resp:  [17-32] 18 (01/08 1100) BP: (127-172)/(59-82) 135/71 (01/08 1000) SpO2:  [94 %-98 %] 96 % (01/08 1100) Last BM Date: 01/12/19 General:   Slight drowsiness this am. PT in room.  Heart:  Regular rate and rhythm   Pulm: Normal  respiratory effort   Abdomen:  Soft, nondistended, mild diffuse upper abdominal tenderess to deeper palpation.  Normal bowel sounds.          Neurologic: Slight drowsiness Psych: cooperative.     Intake/Output from previous day: 01/07 0701 - 01/08 0700 In: 662.8 [I.V.:662.8] Out: 500 [Urine:500] Intake/Output this shift: No intake/output data recorded.  Lab Results: Recent Labs    01/14/19 0140 01/15/19 0204 01/16/19 0214  WBC 14.2* 11.7* 12.5*  HGB 13.7 12.3 11.3*  HCT 43.2 39.3 35.0*  PLT 146* 120* 117*   BMET Recent Labs    01/14/19 0140 01/15/19 0204 01/16/19 0214  NA 137 134* 139  K 4.7 4.5 4.3  CL 107 104 109  CO2 19* 20* 23  GLUCOSE 259* 313* 211*  BUN 39* 33* 30*  CREATININE 1.83* 1.58* 1.43*  CALCIUM 8.2* 8.1* 8.4*   LFT Recent Labs    01/16/19 0214  PROT 6.3*  ALBUMIN 2.4*  AST 34  ALT 114*  ALKPHOS 95  BILITOT 1.0     Principal Problem:   Acute pancreatitis Active Problems:   S/P CABG x 5   Chronic combined systolic and diastolic heart failure (HCC)   AKI (acute kidney injury) (HCC)   DVT (deep venous thrombosis) (HCC)   DKA (diabetic ketoacidoses) (HCC)   Abnormal LFTs     LOS: 4 days   Tye Savoy ,NP 01/16/2019, 11:57 AM

## 2019-01-16 NOTE — Progress Notes (Signed)
Physical Therapy Treatment Patient Details Name: [name] MRN: [id number] DOB: 08-Jul-1950 Today's Date: 01/16/2019    History of Present Illness 69year old female admitted for acute pancreatitis.  PMH:  CHF/ICM, home 02, CAD, CABG, ICD, DM, HTN, OSA, and DVT    PT Comments    Pt seen Step Down ICU.  Assisted OOB to [facility] + 2 assist for safety.  General Gait Details: decreased amb distance due not feeling as well today.  Remanied on 3 lts to avg sats 94%. Assisted back to bed     Follow Up Recommendations  Home health PT;Supervision/Assistance - 24 hour     Equipment Recommendations  None recommended by PT    Recommendations for Other Services       Precautions / Restrictions Precautions Precautions: Fall Precaution Comments: chronic 2L O2 [place] Restrictions Weight Bearing Restrictions: No    Mobility  Bed Mobility Overal bed mobility: Needs Assistance Bed Mobility: Supine to Sit;Sit to Supine     Supine to sit: Mod assist Sit to supine: Mod assist   General bed mobility comments: increased assist this session due to not feeling well and increased pain  Transfers Overall transfer level: Needs assistance Equipment used: Rolling walker (2 wheeled) Transfers: Sit to/from [facility] Sit to Stand: Min assist Stand pivot transfers: Min assist       General transfer comment: assisted from elevated bed to [facility] with 25% VC's on proper hand placement and turn completion.  Ambulation/Gait Ambulation/Gait assistance: Min assist;+2 safety/equipment Gait Distance (Feet): 18 Feet Assistive device: Rolling walker (2 wheeled) Gait Pattern/deviations: Step-through pattern;Decreased stride length Gait velocity: decreased   General Gait Details: decreased amb distance due not feeling as well today.  Remanied on 3 lts to avg sats 94%.   Stairs             Wheelchair Mobility    Modified Rankin (Stroke Patients Only)       Balance                                             Cognition Arousal/Alertness: Awake/alert Behavior During Therapy: Flat affect                                   General Comments: Pt is not feeling as well today.  Issues with increased pain and poor sleep last nigh.      Exercises      General Comments        Pertinent Vitals/Pain Pain Assessment: Faces Faces Pain Scale: Hurts little more Pain Location: upper ABD Pain Descriptors / Indicators: Aching;Discomfort;Grimacing Pain Intervention(s): Monitored during session;Repositioned    Home Living                      Prior Function            PT Goals (current goals can now be found in the care plan section) Progress towards PT goals: Progressing toward goals    Frequency    Min 3X/week      PT Plan Current plan remains appropriate    Co-evaluation              AM-PAC PT "6 Clicks" Mobility   Outcome Measure  Help needed turning from your back to your side  while in a flat bed without using bedrails?: A Little Help needed moving from lying on your back to sitting on the side of a flat bed without using bedrails?: A Little Help needed moving to and from a bed to a chair (including a wheelchair)?: A Little Help needed standing up from a chair using your arms (e.g., wheelchair or bedside chair)?: A Little Help needed to walk in hospital room?: A Little Help needed climbing 3-5 steps with a railing? : A Lot 6 Click Score: 17    End of Session Equipment Utilized During Treatment: Gait belt Activity Tolerance: Treatment limited secondary to medical complications (Comment) Patient left: in bed;with call bell/phone within reach Nurse Communication: Mobility status PT Visit Diagnosis: Other abnormalities of gait and mobility (R26.89)     Time: 1000-1025 PT Time Calculation (min) (ACUTE ONLY): 25 min  Charges:  $Gait Training: 8-22 mins $Therapeutic Activity: 8-22 mins                      Rica Koyanagi  PTA Acute  Rehabilitation Services Pager      [phone] Office      [phone]

## 2019-01-16 NOTE — Progress Notes (Signed)
PROGRESS NOTE    [name]  [id number] DOB: [birth year] DOA: 01/12/2019   PCP: Everardo Beals, NP   Brief Narrative:   Patient is a 69 year old female with history of coronary artery disease status post CABG, chronic systolic heart failure status post AICD, history of V. tach, diabetes type 2, chronic kidney disease stage III, history of DVT on anticoagulation who presents to the emergency department with complaints of worsening abdominal pain.  She was complaining  of generalized abdominal pain mostly in the epigastric area with nausea and vomiting since last 3 weeks.  She was not able to tolerate food or medications .  Pain worsened over 24 to 48 hours.  On presentation she was afebrile.  She was found to have elevated liver enzymes, elevated lipase.  Elevated troponin.  EKG showed nonspecific ST changes.  Denied any chest pain.  CT abdomen/pelvis was concerning for acute pancreatitis. She  Had elevated anion gap, low CO2 concerning for DKA.   Patient was admitted for the management of pancreatitis, possible NSTEMI, DKA, UTI.  Cardiology and GI following.  Currently on heparin drip.  Cardiology suggests this is most likely secondary to demand ischemia,  there is no plan for intervention.  DKA resolved, anion gap closed.  She started back on Lantus and sliding scale.  CT abdomen pelvis consistent with gallstones pancreatitis, general surgery consulted for possible cholecystectomy. General surgery requires medical and cardiology clearance before proceeding with cholecystectomy once acute pancreatitis resolves.   Assessment & Plan:   Principal Problem:   Acute pancreatitis Active Problems:   S/P CABG x 5   Chronic combined systolic and diastolic heart failure (HCC)   AKI (acute kidney injury) (HCC)   DVT (deep venous thrombosis) (HCC)   DKA (diabetic ketoacidoses) (HCC)   Abnormal LFTs  Acute pancreatitis: Most likely gallstone pancreatitis.  Presented with epigastric abdominal  pain.  Pain is better this morning.  Diet advanced to soft diet,  GI following.  CT abdomen/pelvis showed features of pancreatitis,gallstones.  Elevated liver enzymes, lipase and these are all improving .  Not a candidate for ERCP as per GI.  General surgery consultation for possible cholecystectomy , requires medical and cardiology clearance before proceeding with cholecystectomy, once pancreatitis has resolved.  Elevated troponin: Significant elevated troponin on presentation.  Denies any  chest pain.  Nonspecific changes in the EKG.  Echocardiogram  showed ejection fraction of 50%, possible apical wall motion abnormality.  Cardiology closely following.  This is most likely secondary to demand ischemia.  No plan for intervention.  She has history of coronary artery disease and is status post CABG.  Suspected DKA: Low CO2, elevated anion gap on presentation .  History of diabetes type 2.  On insulin at home.  Anionic gap closed,  Off insulin drip now.  Started on Lantus and sliding scale.  Continue monitoring sugars.  Insulin dose adjusted today.  Diabetic oriented following.  Chronic combined  CHF: Currently euvolemic .  Has AICD.  Pacemaker interrogated without finding of VT.  Echocardiogram as above . Cardiology following.  She is on carvedilol, Lasix, Imdur, lisinopril, statin, spironolactone .  Lasix and spironolactone  on hold because she is on gentle IV fluids for pancreatitis.  Acute on CKD stage IIIa: Secondary to vomiting, poor oral intake.  Continue to monitor.  Continue IV fluids. Monitor kidney function.  Baseline creatinine around 1.2.  Suspected UTI:  UA was suggestive of UTI.  Urine culture showed proteus .  Denies dysuria.  Abx  changed to oral keflex.  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  On Xarelto at home.  On heparin drip currently because she might need cholecystectomy in the near future during this hospitalization.  Lactic acidosis: Secondary to  dehydration.  Lactate level improved.  Low suspicion for infectious etiology.  Debility/deconditioning: PT evaluated her and recommended home health.   DVT prophylaxis: IV heparin Code Status: Full code Family Communication: Discussed with patient at bed side.  Called son today and gave update today Disposition Plan: Likely home after clinically stability. She might need cholecystectomy.  PT recommended home health.  Consultants: GI, cardiology  Procedures: None.  Antimicrobials:  Anti-infectives (From admission, onward)   Start     Dose/Rate Route Frequency Ordered Stop   01/15/19 1400  cephALEXin (KEFLEX) capsule 500 mg     500 mg Oral Every 12 hours 01/15/19 1357 01/18/19 0959   01/14/19 1400  cefTRIAXone (ROCEPHIN) 1 g in sodium chloride 0.9 % 100 mL IVPB  Status:  Discontinued     1 g 200 mL/hr over 30 Minutes Intravenous Every 24 hours 01/14/19 1206 01/15/19 1357   01/13/19 2200  cefTRIAXone (ROCEPHIN) 2 g in sodium chloride 0.9 % 100 mL IVPB  Status:  Discontinued     2 g 200 mL/hr over 30 Minutes Intravenous Every 24 hours 01/12/19 2338 01/13/19 1400   01/12/19 2345  metroNIDAZOLE (FLAGYL) IVPB 500 mg  Status:  Discontinued     500 mg 100 mL/hr over 60 Minutes Intravenous Every 8 hours 01/12/19 2338 01/13/19 1400   01/12/19 2300  cefTRIAXone (ROCEPHIN) 2 g in sodium chloride 0.9 % 100 mL IVPB     2 g 200 mL/hr over 30 Minutes Intravenous  Once 01/12/19 2247 01/13/19 0023   01/12/19 2245  cefTRIAXone (ROCEPHIN) 1 g in sodium chloride 0.9 % 100 mL IVPB  Status:  Discontinued     1 g 200 mL/hr over 30 Minutes Intravenous  Once 01/12/19 2242 01/12/19 2247      Subjective: Patient was seen and examined at bedside, she is complaining about feeling tired and is still having mild abdominal pain.  She is being kept NPO for ultrasound.  He denies any nausea, vomiting, diarrhea.  There are no overnight events.  Objective: Vitals:   01/16/19 1000 01/16/19 1100 01/16/19  1200 01/16/19 1220  BP: 135/71  (!) 152/57   Pulse: 64 65 60 65  Resp: (!) 22 18 (!) 22 (!) 22  Temp:   98.3 F (36.8 C)   TempSrc:   Axillary   SpO2: 96% 96% 97% 97%  Weight:      Height:        Intake/Output Summary (Last 24 hours) at 01/16/2019 1241 Last data filed at 01/16/2019 0600 Gross per 24 hour  Intake 580.72 ml  Output 500 ml  Net 80.72 ml   Filed Weights   01/12/19 1740  Weight: 103.9 kg    Examination:  General exam: Not in distress, morbidly obese. Respiratory system: Bilateral equal air entry, clear to auscultation. Respiratory effort normal. Cardiovascular system: S1 & S2 heard, RRR. No JVD, murmurs, rubs, gallops or clicks. No pedal edema. Gastrointestinal system: Abdomen is nondistended, soft and mildly tender in the epigastric region.. No organomegaly or masses felt. Normal bowel sounds heard. Central nervous system: Alert and oriented. No focal neurological deficits. Extremities: No edema, no clubbing, no cyanosis Skin: No rashes, lesions or ulcers Psychiatry: Judgement and insight appear normal. Mood & affect appropriate.  Data Reviewed: I have personally reviewed following labs and imaging studies  CBC: Recent Labs  Lab 01/12/19 1857 01/13/19 0849 01/14/19 0140 01/15/19 0204 01/16/19 0214  WBC 13.4* 13.0* 14.2* 11.7* 12.5*  NEUTROABS 12.3*  --  11.6*  --   --   HGB 14.6 15.2* 13.7 12.3 11.3*  HCT 45.2 46.9* 43.2 39.3 35.0*  MCV 101.3* 99.8 101.6* 101.6* 100.0  PLT 164 162 146* 120* 123XX123*   Basic Metabolic Panel: Recent Labs  Lab 01/13/19 0849 01/13/19 1044 01/14/19 0140 01/15/19 0204 01/16/19 0214  NA 138 138 137 134* 139  K 5.4* 5.0 4.7 4.5 4.3  CL 107 106 107 104 109  CO2 18* 18* 19* 20* 23  GLUCOSE 331* 340* 259* 313* 211*  BUN 31* 32* 39* 33* 30*  CREATININE 1.50* 1.68* 1.83* 1.58* 1.43*  CALCIUM 8.8* 8.6* 8.2* 8.1* 8.4*   GFR: Estimated Creatinine Clearance: 43.4 mL/min (A) (by C-G formula based on SCr of 1.43 mg/dL  (H)). Liver Function Tests: Recent Labs  Lab 01/12/19 1857 01/13/19 0651 01/14/19 0140 01/15/19 0204 01/16/19 0214  AST 819* 358* 153* 71* 34  ALT 589* 439* 265* 167* 114*  ALKPHOS 170* 156* 131* 109 95  BILITOT 2.1* 3.0* 2.5* 1.6* 1.0  PROT 7.8 7.3 6.8 6.4* 6.3*  ALBUMIN 3.5 3.1* 3.0* 2.7* 2.4*   Recent Labs  Lab 01/12/19 1857 01/15/19 0204  LIPASE 4,497* 359*   No results for input(s): AMMONIA in the last 168 hours. Coagulation Profile: Recent Labs  Lab 01/13/19 0651  INR 1.7*   Cardiac Enzymes: No results for input(s): CKTOTAL, CKMB, CKMBINDEX, TROPONINI in the last 168 hours. BNP (last 3 results) No results for input(s): PROBNP in the last 8760 hours. HbA1C: No results for input(s): HGBA1C in the last 72 hours. CBG: Recent Labs  Lab 01/15/19 1153 01/15/19 1616 01/15/19 2129 01/16/19 0725 01/16/19 1154  GLUCAP 284* 299* 234* 212* 212*   Lipid Profile: No results for input(s): CHOL, HDL, LDLCALC, TRIG, CHOLHDL, LDLDIRECT in the last 72 hours. Thyroid Function Tests: No results for input(s): TSH, T4TOTAL, FREET4, T3FREE, THYROIDAB in the last 72 hours. Anemia Panel: No results for input(s): VITAMINB12, FOLATE, FERRITIN, TIBC, IRON, RETICCTPCT in the last 72 hours. Sepsis Labs: Recent Labs  Lab 01/12/19 1857 01/13/19 0341 01/14/19 0140 01/15/19 0204  LATICACIDVEN 6.1* 2.9* 2.5* 1.9    Recent Results (from the past 240 hour(s))  SARS CORONAVIRUS 2 (TAT 6-24 HRS) Nasopharyngeal Nasopharyngeal Swab     Status: None   Collection Time: 01/12/19  8:45 PM   Specimen: Nasopharyngeal Swab  Result Value Ref Range Status   SARS Coronavirus 2 NEGATIVE NEGATIVE Final    Comment: (NOTE) SARS-CoV-2 target nucleic acids are NOT DETECTED. The SARS-CoV-2 RNA is generally detectable in upper and lower respiratory specimens during the acute phase of infection. Negative results do not preclude SARS-CoV-2 infection, do not rule out co-infections with other pathogens,  and should not be used as the sole basis for treatment or other patient management decisions. Negative results must be combined with clinical observations, patient history, and epidemiological information. The expected result is Negative. Fact Sheet for Patients: SugarRoll.be Fact Sheet for Healthcare Providers: https://www.woods-mathews.com/ This test is not yet approved or cleared by the [place] FDA and  has been authorized for detection and/or diagnosis of SARS-CoV-2 by FDA under an Emergency Use Authorization (EUA). This EUA will remain  in effect (meaning this test can be used) for the duration of the COVID-19 declaration under  Section 56 4(b)(1) of the Act, 21 U.S.C. section 360bbb-3(b)(1), unless the authorization is terminated or revoked sooner. Performed at [facility], [place] [address] [place], [place] [address]   Urine culture     Status: Abnormal   Collection Time: 01/12/19 10:43 PM   Specimen: Urine, Clean Catch  Result Value Ref Range Status   Specimen Description   Final    URINE, CLEAN CATCH Performed at [facility], [place] [address] [place], [place] [address]    Special Requests   Final    NONE Performed at [facility], [place] [address], [place], [place] [address]    Culture >=100,000 COLONIES/mL PROTEUS MIRABILIS (A)  Final   Report Status 01/15/2019 FINAL  Final   Organism ID, Bacteria PROTEUS MIRABILIS (A)  Final      Susceptibility   Proteus mirabilis - MIC*    AMPICILLIN <=2 SENSITIVE Sensitive     CEFAZOLIN <=4 SENSITIVE Sensitive     CEFTRIAXONE <=0.25 SENSITIVE Sensitive     CIPROFLOXACIN <=0.25 SENSITIVE Sensitive     GENTAMICIN <=1 SENSITIVE Sensitive     IMIPENEM 2 SENSITIVE Sensitive     NITROFURANTOIN 128 RESISTANT Resistant     TRIMETH/SULFA <=20 SENSITIVE Sensitive     AMPICILLIN/SULBACTAM <=2 SENSITIVE Sensitive     PIP/TAZO <=4 SENSITIVE Sensitive      * >=100,000 COLONIES/mL PROTEUS MIRABILIS  MRSA PCR Screening     Status: None   Collection Time: 01/13/19  5:43 PM   Specimen: Nasopharyngeal  Result Value Ref Range Status   MRSA by PCR NEGATIVE NEGATIVE Final    Comment:        The GeneXpert MRSA Assay (FDA approved for NASAL specimens only), is one component of a comprehensive MRSA colonization surveillance program. It is not intended to diagnose MRSA infection nor to guide or monitor treatment for MRSA infections. Performed at [facility], [place] [address] [place], [place] [address]     Radiology Studies: No results found.   Scheduled Meds: . carvedilol  12.5 mg Oral BID WC  . cephALEXin  500 mg Oral Q12H  . Chlorhexidine Gluconate Cloth  6 each Topical Daily  . insulin aspart  0-5 Units Subcutaneous QHS  . insulin aspart  0-9 Units Subcutaneous TID WC  . insulin aspart  5 Units Subcutaneous TID WC  . insulin glargine  35 Units Subcutaneous Daily  . isosorbide mononitrate  30 mg Oral Daily  . latanoprost  1 drop Both Eyes QHS  . mouth rinse  15 mL Mouth Rinse BID   Continuous Infusions: . sodium chloride 75 mL/hr at 01/16/19 0810  . heparin 1,300 Units/hr (01/15/19 2310)     LOS: 4 days    Time spent: 35 mins.    Shawna Clamp, MD [facility]   If 7PM-7AM, please contact night-coverage

## 2019-01-16 NOTE — Progress Notes (Signed)
OT Cancellation Note  Patient Details Name: SCOTLYNN SORG MRN: [id number] DOB: 19-Dec-1950   Cancelled Treatment:    Reason Eval/Treat Not Completed: Other (comment).  Per nursing, pt fatiqued after PT She has some tests scheduled.  Will check on her another day.  Solomia Harrell 01/16/2019, 12:47 PM  Karsten Ro, OTR/L Acute Rehabilitation Services 01/16/2019

## 2019-01-17 ENCOUNTER — Encounter (HOSPITAL_COMMUNITY): Payer: Self-pay

## 2019-01-17 ENCOUNTER — Ambulatory Visit (HOSPITAL_COMMUNITY)
Admit: 2019-01-17 | Discharge: 2019-01-17 | Disposition: A | Discharge status: Home / Self Care | Payer: Medicare HMO | Attending: Student | Admitting: Student

## 2019-01-17 DIAGNOSIS — R7989 Other specified abnormal findings of blood chemistry: Secondary | ICD-10-CM

## 2019-01-17 DIAGNOSIS — I5042 Chronic combined systolic (congestive) and diastolic (congestive) heart failure: Secondary | ICD-10-CM

## 2019-01-17 LAB — HEPARIN LEVEL (UNFRACTIONATED)
Heparin Unfractionated: 0.15 IU/mL — ABNORMAL LOW (ref 0.30–0.70)
Heparin Unfractionated: 0.37 IU/mL (ref 0.30–0.70)

## 2019-01-17 LAB — NM MYOCAR MULTI W/SPECT W/WALL MOTION / EF
MPHR: 152 {beats}/min
Peak HR: 93 {beats}/min
Percent HR: 61 %
Rest HR: 86 {beats}/min

## 2019-01-17 LAB — CBC
HCT: 33.6 % — ABNORMAL LOW (ref 36.0–46.0)
Hemoglobin: 10.5 g/dL — ABNORMAL LOW (ref 12.0–15.0)
MCH: 31.8 pg (ref 26.0–34.0)
MCHC: 31.3 g/dL (ref 30.0–36.0)
MCV: 101.8 fL — ABNORMAL HIGH (ref 80.0–100.0)
Platelets: 129 10*3/uL — ABNORMAL LOW (ref 150–400)
RBC: 3.3 MIL/uL — ABNORMAL LOW (ref 3.87–5.11)
RDW: 13.7 % (ref 11.5–15.5)
WBC: 11.1 10*3/uL — ABNORMAL HIGH (ref 4.0–10.5)
nRBC: 0 % (ref 0.0–0.2)

## 2019-01-17 LAB — COMPREHENSIVE METABOLIC PANEL
ALT: 80 U/L — ABNORMAL HIGH (ref 0–44)
AST: 29 U/L (ref 15–41)
Albumin: 2.4 g/dL — ABNORMAL LOW (ref 3.5–5.0)
Alkaline Phosphatase: 123 U/L (ref 38–126)
Anion gap: 7 (ref 5–15)
BUN: 26 mg/dL — ABNORMAL HIGH (ref 8–23)
CO2: 24 mmol/L (ref 22–32)
Calcium: 8.6 mg/dL — ABNORMAL LOW (ref 8.9–10.3)
Chloride: 110 mmol/L (ref 98–111)
Creatinine, Ser: 1.35 mg/dL — ABNORMAL HIGH (ref 0.44–1.00)
GFR calc Af Amer: 47 mL/min — ABNORMAL LOW (ref >60)
GFR calc non Af Amer: 40 mL/min — ABNORMAL LOW (ref >60)
Glucose, Bld: 188 mg/dL — ABNORMAL HIGH (ref 70–99)
Potassium: 4.2 mmol/L (ref 3.5–5.1)
Sodium: 141 mmol/L (ref 135–145)
Total Bilirubin: 0.9 mg/dL (ref 0.3–1.2)
Total Protein: 6.1 g/dL — ABNORMAL LOW (ref 6.5–8.1)

## 2019-01-17 LAB — GLUCOSE, CAPILLARY
Glucose-Capillary: 122 mg/dL — ABNORMAL HIGH (ref 70–99)
Glucose-Capillary: 124 mg/dL — ABNORMAL HIGH (ref 70–99)
Glucose-Capillary: 138 mg/dL — ABNORMAL HIGH (ref 70–99)
Glucose-Capillary: 170 mg/dL — ABNORMAL HIGH (ref 70–99)
Glucose-Capillary: 214 mg/dL — ABNORMAL HIGH (ref 70–99)

## 2019-01-17 LAB — LIPASE, BLOOD: Lipase: 42 U/L (ref 11–51)

## 2019-01-17 LAB — PHOSPHORUS: Phosphorus: 1.7 mg/dL — ABNORMAL LOW (ref 2.5–4.6)

## 2019-01-17 LAB — MAGNESIUM: Magnesium: 2.4 mg/dL (ref 1.7–2.4)

## 2019-01-17 MED ORDER — K PHOS MONO-SOD PHOS DI & MONO 155-852-130 MG PO TABS
250.0000 mg | ORAL_TABLET | Freq: Three times a day (TID) | ORAL | Status: DC
  Administered 2019-01-17 (×2): 250 mg via ORAL
  Filled 2019-01-17 (×4): qty 1

## 2019-01-17 MED ORDER — REGADENOSON 0.4 MG/5ML IV SOLN
INTRAVENOUS | Status: AC
  Filled 2019-01-17: qty 5

## 2019-01-17 MED ORDER — TECHNETIUM TC 99M TETROFOSMIN IV KIT
32.5000 | PACK | Freq: Once | INTRAVENOUS | Status: AC | PRN
  Administered 2019-01-17: 32.5 via INTRAVENOUS

## 2019-01-17 MED ORDER — REGADENOSON 0.4 MG/5ML IV SOLN
0.4000 mg | Freq: Once | INTRAVENOUS | Status: AC
  Administered 2019-01-17: 0.4 mg via INTRAVENOUS
  Filled 2019-01-17: qty 5

## 2019-01-17 MED ORDER — TECHNETIUM TC 99M TETROFOSMIN IV KIT
10.2300 | PACK | Freq: Once | INTRAVENOUS | Status: AC | PRN
  Administered 2019-01-17: 10.23 via INTRAVENOUS

## 2019-01-17 NOTE — Progress Notes (Signed)
   Benay L Banker presented for a nuclear stress test today.  No immediate complications.  Stress imaging is pending at this time.  Preliminary EKG findings may be listed in the chart, but the stress test result will not be finalized until perfusion imaging is complete.  Tami Lin Laury Huizar, PA-C 01/17/2019, 12:03 PM

## 2019-01-17 NOTE — Progress Notes (Signed)
Assessment & Plan: Biliary pancreatitis, cholelithiasis, chronic cholecystitis  Lipase, LFT's normalizing  OK to allow diet today and monitor clinical symptoms  Mild epigastric pain, tenderness persists  IV Rocephin Coronary artery disease  elevatedtroponin's/demand ischemia?  Hx CABG  Ischemic cardiomyopathy with a EF 35%  Hx V. tach with AICD DKA/type 2 diabetes DVT on chronic anticoagulation  IV heparin Hypertension Hyperlipidemia Morbid obesity, BMI of 40.57  Patient will need cholecystectomy once medical stable and cardiology evaluation completed.  Planning on early next week if clinically ready.        Armandina Gemma, MD       [facility]       Office: [phone]   Chief Complaint: Biliary pancreatitis  Subjective: Patient in bed.  "[name]"  Wants to eat.  Objective: Vital signs in last 24 hours: Temp:  [97.7 F (36.5 C)-98.3 F (36.8 C)] 98.3 F (36.8 C) (01/09 0000) Pulse Rate:  [60-91] 91 (01/09 0600) Resp:  [15-27] 27 (01/09 0600) BP: (110-182)/(26-89) 182/89 (01/09 0600) SpO2:  [95 %-100 %] 97 % (01/09 0600) Last BM Date: 01/12/19  Intake/Output from previous day: 01/08 0701 - 01/09 0700 In: 2319 [P.O.:480; I.V.:1839] Out: 1100 [Urine:1100] Intake/Output this shift: No intake/output data recorded.  Physical Exam: HEENT - sclerae clear, mucous membranes moist Neck - soft Abdomen - soft, obese; BS present; mild tenderness in epigastrium and RUQ, no mass   Lab Results:  Recent Labs    01/16/19 0214 01/17/19 0132  WBC 12.5* 11.1*  HGB 11.3* 10.5*  HCT 35.0* 33.6*  PLT 117* 129*   BMET Recent Labs    01/16/19 0214 01/17/19 0132  NA 139 141  K 4.3 4.2  CL 109 110  CO2 23 24  GLUCOSE 211* 188*  BUN 30* 26*  CREATININE 1.43* 1.35*  CALCIUM 8.4* 8.6*   PT/INR No results for input(s): LABPROT, INR in the last 72 hours. Comprehensive Metabolic Panel:    Component Value Date/Time   NA 141 01/17/2019  0132   NA 139 01/16/2019 0214   NA 135 11/03/2018 1205   NA 135 01/31/2018 1029   K 4.2 01/17/2019 0132   K 4.3 01/16/2019 0214   CL 110 01/17/2019 0132   CL 109 01/16/2019 0214   CO2 24 01/17/2019 0132   CO2 23 01/16/2019 0214   BUN 26 (H) 01/17/2019 0132   BUN 30 (H) 01/16/2019 0214   BUN 32 (H) 11/03/2018 1205   BUN 22 01/31/2018 1029   CREATININE 1.35 (H) 01/17/2019 0132   CREATININE 1.43 (H) 01/16/2019 0214   CREATININE 0.88 09/20/2015 1153   CREATININE 1.11 (H) 05/19/2015 1503   GLUCOSE 188 (H) 01/17/2019 0132   GLUCOSE 211 (H) 01/16/2019 0214   CALCIUM 8.6 (L) 01/17/2019 0132   CALCIUM 8.4 (L) 01/16/2019 0214   AST 29 01/17/2019 0132   AST 34 01/16/2019 0214   ALT 80 (H) 01/17/2019 0132   ALT 114 (H) 01/16/2019 0214   ALKPHOS 123 01/17/2019 0132   ALKPHOS 95 01/16/2019 0214   BILITOT 0.9 01/17/2019 0132   BILITOT 1.0 01/16/2019 0214   BILITOT 0.5 11/03/2018 1205   BILITOT 0.7 08/05/2017 1048   PROT 6.1 (L) 01/17/2019 0132   PROT 6.3 (L) 01/16/2019 0214   PROT 7.6 11/03/2018 1205   PROT 7.5 08/05/2017 1048   ALBUMIN 2.4 (L) 01/17/2019 0132   ALBUMIN 2.4 (L) 01/16/2019 0214   ALBUMIN 4.2 11/03/2018 1205   ALBUMIN 4.1 08/05/2017 1048  Studies/Results: US Abdomen Limited RUQ  Result Date: 01/16/2019 CLINICAL DATA:  Gallstone pancreatitis. EXAM: ULTRASOUND ABDOMEN LIMITED RIGHT UPPER QUADRANT COMPARISON:  CT abdomen/pelvis 01/12/2019 FINDINGS: The examination is somewhat limited due to patient body habitus. Gallbladder: There are echogenic foci within the body and neck of the gallbladder consistent with gallstones demonstrated on CT abdomen/pelvis 01/12/2019. No gallbladder wall thickening. No sonographic Percell Miller sign is elicited by the scanning technologist. Common bile duct: Diameter: The common duct is mildly dilated measuring up to 8 mm in diameter. Liver: No focal lesion identified. Mildly increased hepatic parenchymal echogenicity. Interrogated portal vein is  patent on color Doppler imaging with normal direction of blood flow towards the liver. Other: The pancreas is not visualized. IMPRESSION: Cholecystolithiasis without sonographic evidence of acute cholecystitis. Mildly dilated common duct measuring up to 8 mm in diameter. Mildly increased hepatic parenchymal echogenicity. This is a nonspecific finding, which may be seen in the setting of hepatic steatosis or other chronic hepatic parenchymal disease. Electronically Signed   By: Kellie Simmering DO   On: 01/16/2019 13:27      Armandina Gemma 01/17/2019  Patient ID: [name], female   DOB: 10-Mar-1950, 69 y.o.   MRN: [id number]

## 2019-01-17 NOTE — Progress Notes (Signed)
[place] for IV heparin Indication: Hx DVT  Allergies  Allergen Reactions  . Lipitor  [Atorvastatin Calcium] Rash  . Metformin And Related Itching, Swelling and Other (See Comments)    Leg pain & swelling in legs  . Atorvastatin Itching and Rash  . Levofloxacin Itching    Patient Measurements: Height: 5\' 3"  (160 cm) Weight: 229 lb (103.9 kg) IBW/kg (Calculated) : 52.4 Heparin Dosing Weight: 77 kg  Vital Signs: Temp: 98.3 F (36.8 C) (01/09 0000) Temp Source: Oral (01/09 0000) BP: 147/52 (01/08 2200) Pulse Rate: 61 (01/08 2200)  Labs: Recent Labs    01/15/19 0204 01/15/19 1953 01/16/19 0214 01/17/19 0132  HGB 12.3  --  11.3* 10.5*  HCT 39.3  --  35.0* 33.6*  PLT 120*  --  117* 129*  HEPARINUNFRC 0.23* 0.40 0.37 0.15*  CREATININE 1.58*  --  1.43* 1.35*    Estimated Creatinine Clearance: 46 mL/min (A) (by C-G formula based on SCr of 1.35 mg/dL (H)).  Medications:  PTA Xarelto 20mg  daily - Last dose 01/11/19 @ 22:00  Scheduled:  . carvedilol  12.5 mg Oral BID WC  . cephALEXin  500 mg Oral Q12H  . Chlorhexidine Gluconate Cloth  6 each Topical Daily  . insulin aspart  0-5 Units Subcutaneous QHS  . insulin aspart  0-9 Units Subcutaneous TID WC  . insulin aspart  5 Units Subcutaneous TID WC  . insulin glargine  35 Units Subcutaneous Daily  . isosorbide mononitrate  30 mg Oral Daily  . latanoprost  1 drop Both Eyes QHS  . mouth rinse  15 mL Mouth Rinse BID   Infusions:  . sodium chloride 75 mL/hr at 01/16/19 2157  . heparin 1,300 Units/hr (01/16/19 2156)    Assessment: 74 yoF with extensive cardiac history including DVT on Xarelto, CAD s/p CABG 2007, HFrEF with AICD, HTN, HLD, DM, presents with n/v d/t pancreatitis. Pharmacy consulted to dose IV heparin while Xarelto on hold   Prior anticoagulation: Xarelto 20 mg daily, last dose 01/11/19  Significant events:  Today, 01/17/2019:  Daily HL SUBtherapeutic this AM on 1300  units/hr, had been consistently therapeutic prior; no infusion issues per RN and running at correct rate  Hgb and plts trending down - monitor closely  No bleeding or infusion issues noted  Goal of Therapy: Heparin level 0.3-0.7 units/ml Monitor platelets by anticoagulation protocol: Yes  Plan:  Increase heparin drip to 1700 units/hr, no bolus  Daily CBC and heparin level  Monitor for signs of bleeding or thrombosis  F/u for ability to transition back to [place], PharmD, [place] 01/17/2019, 3:58 AM

## 2019-01-17 NOTE — Progress Notes (Signed)
PROGRESS NOTE    [name]  [id number] DOB: 10-Aug-1950 DOA: 01/12/2019   PCP: Everardo Beals, NP   Brief Narrative:   Patient is a 69 year old female with history of coronary artery disease status post CABG, chronic systolic heart failure status post AICD, history of V. tach, diabetes type 2, chronic kidney disease stage III, history of DVT on anticoagulation who presents to the emergency department with complaints of worsening abdominal pain.  She was complaining  of generalized abdominal pain mostly in the epigastric area with nausea and vomiting since last 3 weeks.  She was not able to tolerate food or medications .  Pain worsened over 24 to 48 hours.  On presentation she was afebrile.  She was found to have elevated liver enzymes, elevated lipase.  Elevated troponin.  EKG showed nonspecific ST changes.  Denied any chest pain.  CT abdomen/pelvis was concerning for acute pancreatitis. She  Had elevated anion gap, low CO2 concerning for DKA.    Patient was admitted for the management of pancreatitis, possible NSTEMI, DKA, UTI.  Cardiology and GI following.  Currently on heparin drip.  Cardiology suggests this is most likely secondary to demand ischemia,  there is no plan for intervention.  DKA resolved, anion gap closed.  She started back on Lantus and sliding scale.  CT abdomen pelvis consistent with gallstones pancreatitis, general surgery consulted for possible cholecystectomy. General surgery requires medical and cardiology clearance before proceeding with cholecystectomy once acute pancreatitis resolves. Patient is scheduled to have Lexiscan stress test today and further plan will be decided afterwards.  Pancreatitis has resolved.   Assessment & Plan:   Principal Problem:   Acute pancreatitis Active Problems:   S/P CABG x 5   Chronic combined systolic and diastolic heart failure (HCC)   AKI (acute kidney injury) (HCC)   DVT (deep venous thrombosis) (HCC)   DKA (diabetic  ketoacidoses) (HCC)   Abnormal LFTs   Demand ischemia (HCC)  Acute pancreatitis - Resolved. Most likely gallstone pancreatitis.  Presented with epigastric abdominal pain.  Pain is better this morning.  Diet advanced to soft diet,  GI following.  CT abdomen/pelvis showed features of pancreatitis, gallstones.  Elevated liver enzymes, lipase and these are all improving .  Not a candidate for ERCP as per GI.  General surgery consultated for possible cholecystectomy , requires medical and cardiology clearance before proceeding with cholecystectomy, once pancreatitis has resolved.  Lipase has normalized, abdominal pain resolved.  Elevated troponin:  Significant elevated troponin on presentation.  Denies any  chest pain.  Nonspecific changes in the EKG.  Echocardiogram  showed ejection fraction of 50%, possible apical wall motion abnormality.  Cardiology closely following.  This is most likely secondary to demand ischemia.  No plan for intervention.  She has history of coronary artery disease and is status post CABG. She is scheduled to have a Lexiscan stress test today requiring cardiac clearance for possible cholecystectomy on Monday.  Suspected DKA- Resolved.  Low CO2, elevated anion gap on presentation .  History of diabetes type 2.  On insulin at home.  Anionic gap closed,  Off insulin drip now.  Started on Lantus and sliding scale.  Continue monitoring sugars.  Insulin dose adjusted today.  Diabetic oriented following.  Chronic combined  CHF: Currently euvolemic .  Has AICD.  Pacemaker interrogated without finding of VT.  Echocardiogram as above . Cardiology following.  She is on carvedilol, Lasix, Imdur, lisinopril, statin, spironolactone .  Lasix and spironolactone  on hold  because she is on gentle IV fluids for pancreatitis.  Acute on CKD stage IIIa: Improving Secondary to vomiting, poor oral intake.  Continue to monitor.  Continue IV fluids. Monitor kidney function.  Baseline creatinine around  1.2.  Suspected UTI:  UA was suggestive of UTI.  Urine culture showed proteus .  Denies dysuria.  Abx changed to oral keflex.  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  On Xarelto at home.  On heparin drip currently because she might need cholecystectomy in the near future during this hospitalization.  Lactic acidosis: Secondary to dehydration.  Lactate level improved.  Low suspicion for infectious etiology.  Debility/deconditioning: PT evaluated her and recommended home health.   DVT prophylaxis: IV heparin Code Status: Full code Family Communication: Discussed with patient at bed side.  Called son today and gave update today. Disposition Plan: Likely home after clinical stability. She might need cholecystectomy.  PT recommended home health.  Consultants: GI, cardiology, surgery  Procedures: None.  Antimicrobials:  Anti-infectives (From admission, onward)   Start     Dose/Rate Route Frequency Ordered Stop   01/15/19 1400  cephALEXin (KEFLEX) capsule 500 mg     500 mg Oral Every 12 hours 01/15/19 1357 01/18/19 0959   01/14/19 1400  cefTRIAXone (ROCEPHIN) 1 g in sodium chloride 0.9 % 100 mL IVPB  Status:  Discontinued     1 g 200 mL/hr over 30 Minutes Intravenous Every 24 hours 01/14/19 1206 01/15/19 1357   01/13/19 2200  cefTRIAXone (ROCEPHIN) 2 g in sodium chloride 0.9 % 100 mL IVPB  Status:  Discontinued     2 g 200 mL/hr over 30 Minutes Intravenous Every 24 hours 01/12/19 2338 01/13/19 1400   01/12/19 2345  metroNIDAZOLE (FLAGYL) IVPB 500 mg  Status:  Discontinued     500 mg 100 mL/hr over 60 Minutes Intravenous Every 8 hours 01/12/19 2338 01/13/19 1400   01/12/19 2300  cefTRIAXone (ROCEPHIN) 2 g in sodium chloride 0.9 % 100 mL IVPB     2 g 200 mL/hr over 30 Minutes Intravenous  Once 01/12/19 2247 01/13/19 0023   01/12/19 2245  cefTRIAXone (ROCEPHIN) 1 g in sodium chloride 0.9 % 100 mL IVPB  Status:  Discontinued     1 g 200 mL/hr over 30  Minutes Intravenous  Once 01/12/19 2242 01/12/19 2247      Subjective: Patient was seen and examined at bedside, She states " feeling blessed", She is being kept NPO for Lexiscan stress test.  She denies any nausea, vomiting, diarrhea.  There are no overnight events.  Objective: Vitals:   01/17/19 0400 01/17/19 0600 01/17/19 0800 01/17/19 0849  BP: (!) 171/86 (!) 182/89 (!) 164/59   Pulse: 64 91 71   Resp: 20 (!) 27 (!) 24   Temp:    98.6 F (37 C)  TempSrc:    Oral  SpO2: 96% 97% 96%   Weight:      Height:        Intake/Output Summary (Last 24 hours) at 01/17/2019 1119 Last data filed at 01/17/2019 0953 Gross per 24 hour  Intake 2887.97 ml  Output 1100 ml  Net 1787.97 ml   Filed Weights   01/12/19 1740  Weight: 103.9 kg    Examination:  General exam: Not in distress, morbidly obese. Respiratory system: Bilateral equal air entry, clear to auscultation. Respiratory effort normal. Cardiovascular system: S1 & S2 heard, RRR. No JVD, murmurs, rubs, gallops or clicks. No pedal edema. Gastrointestinal system: Abdomen  is nondistended, soft and mildly tender in the epigastric region.. No organomegaly or masses felt. Normal bowel sounds heard. Central nervous system: Alert and oriented. No focal neurological deficits. Extremities: No edema, no clubbing, no cyanosis Skin: No rashes, lesions or ulcers Psychiatry: Judgement and insight appear normal. Mood & affect appropriate.    Data Reviewed: I have personally reviewed following labs and imaging studies  CBC: Recent Labs  Lab 01/12/19 1857 01/13/19 0849 01/14/19 0140 01/15/19 0204 01/16/19 0214 01/17/19 0132  WBC 13.4* 13.0* 14.2* 11.7* 12.5* 11.1*  NEUTROABS 12.3*  --  11.6*  --   --   --   HGB 14.6 15.2* 13.7 12.3 11.3* 10.5*  HCT 45.2 46.9* 43.2 39.3 35.0* 33.6*  MCV 101.3* 99.8 101.6* 101.6* 100.0 101.8*  PLT 164 162 146* 120* 117* [id number]*   Basic Metabolic Panel: Recent Labs  Lab 01/13/19 1044 01/14/19 0140  01/15/19 0204 01/16/19 0214 01/17/19 0132  NA 138 137 134* 139 141  K 5.0 4.7 4.5 4.3 4.2  CL 106 107 104 109 110  CO2 18* 19* 20* 23 24  GLUCOSE 340* 259* 313* 211* 188*  BUN 32* 39* 33* 30* 26*  CREATININE 1.68* 1.83* 1.58* 1.43* 1.35*  CALCIUM 8.6* 8.2* 8.1* 8.4* 8.6*  MG  --   --   --   --  2.4  PHOS  --   --   --   --  1.7*   GFR: Estimated Creatinine Clearance: 46 mL/min (A) (by C-G formula based on SCr of 1.35 mg/dL (H)). Liver Function Tests: Recent Labs  Lab 01/13/19 0651 01/14/19 0140 01/15/19 0204 01/16/19 0214 01/17/19 0132  AST 358* 153* 71* 34 29  ALT 439* 265* 167* 114* 80*  ALKPHOS 156* 131* 109 95 123  BILITOT 3.0* 2.5* 1.6* 1.0 0.9  PROT 7.3 6.8 6.4* 6.3* 6.1*  ALBUMIN 3.1* 3.0* 2.7* 2.4* 2.4*   Recent Labs  Lab 01/12/19 1857 01/15/19 0204 01/17/19 0132  LIPASE 4,497* 359* 42   No results for input(s): AMMONIA in the last 168 hours. Coagulation Profile: Recent Labs  Lab 01/13/19 0651  INR 1.7*   Cardiac Enzymes: No results for input(s): CKTOTAL, CKMB, CKMBINDEX, TROPONINI in the last 168 hours. BNP (last 3 results) No results for input(s): PROBNP in the last 8760 hours. HbA1C: No results for input(s): HGBA1C in the last 72 hours. CBG: Recent Labs  Lab 01/16/19 0725 01/16/19 1154 01/16/19 1612 01/16/19 2149 01/17/19 0836  GLUCAP 212* 212* 171* 195* 138*   Lipid Profile: No results for input(s): CHOL, HDL, LDLCALC, TRIG, CHOLHDL, LDLDIRECT in the last 72 hours. Thyroid Function Tests: No results for input(s): TSH, T4TOTAL, FREET4, T3FREE, THYROIDAB in the last 72 hours. Anemia Panel: No results for input(s): VITAMINB12, FOLATE, FERRITIN, TIBC, IRON, RETICCTPCT in the last 72 hours. Sepsis Labs: Recent Labs  Lab 01/12/19 1857 01/13/19 0341 01/14/19 0140 01/15/19 0204  LATICACIDVEN 6.1* 2.9* 2.5* 1.9    Recent Results (from the past 240 hour(s))  SARS CORONAVIRUS 2 (TAT 6-24 HRS) Nasopharyngeal Nasopharyngeal Swab      Status: None   Collection Time: 01/12/19  8:45 PM   Specimen: Nasopharyngeal Swab  Result Value Ref Range Status   SARS Coronavirus 2 NEGATIVE NEGATIVE Final    Comment: (NOTE) SARS-CoV-2 target nucleic acids are NOT DETECTED. The SARS-CoV-2 RNA is generally detectable in upper and lower respiratory specimens during the acute phase of infection. Negative results do not preclude SARS-CoV-2 infection, do not rule out co-infections with other pathogens,  and should not be used as the sole basis for treatment or other patient management decisions. Negative results must be combined with clinical observations, patient history, and epidemiological information. The expected result is Negative. Fact Sheet for Patients: SugarRoll.be Fact Sheet for Healthcare Providers: https://www.woods-mathews.com/ This test is not yet approved or cleared by the [place] FDA and  has been authorized for detection and/or diagnosis of SARS-CoV-2 by FDA under an Emergency Use Authorization (EUA). This EUA will remain  in effect (meaning this test can be used) for the duration of the COVID-19 declaration under Section 56 4(b)(1) of the Act, 21 U.S.C. section 360bbb-3(b)(1), unless the authorization is terminated or revoked sooner. Performed at [facility], [place] [address], [place], [place] [address]   Urine culture     Status: Abnormal   Collection Time: 01/12/19 10:43 PM   Specimen: Urine, Clean Catch  Result Value Ref Range Status   Specimen Description   Final    URINE, CLEAN CATCH Performed at [facility], [place] [address], [place], [place] [address]    Special Requests   Final    NONE Performed at [facility], [place] [address] [place], [place] [address]    Culture >=100,000 COLONIES/mL PROTEUS MIRABILIS (A)  Final   Report Status 01/15/2019 FINAL  Final   Organism ID, Bacteria PROTEUS MIRABILIS (A)  Final       Susceptibility   Proteus mirabilis - MIC*    AMPICILLIN <=2 SENSITIVE Sensitive     CEFAZOLIN <=4 SENSITIVE Sensitive     CEFTRIAXONE <=0.25 SENSITIVE Sensitive     CIPROFLOXACIN <=0.25 SENSITIVE Sensitive     GENTAMICIN <=1 SENSITIVE Sensitive     IMIPENEM 2 SENSITIVE Sensitive     NITROFURANTOIN 128 RESISTANT Resistant     TRIMETH/SULFA <=20 SENSITIVE Sensitive     AMPICILLIN/SULBACTAM <=2 SENSITIVE Sensitive     PIP/TAZO <=4 SENSITIVE Sensitive     * >=100,000 COLONIES/mL PROTEUS MIRABILIS  MRSA PCR Screening     Status: None   Collection Time: 01/13/19  5:43 PM   Specimen: Nasopharyngeal  Result Value Ref Range Status   MRSA by PCR NEGATIVE NEGATIVE Final    Comment:        The GeneXpert MRSA Assay (FDA approved for NASAL specimens only), is one component of a comprehensive MRSA colonization surveillance program. It is not intended to diagnose MRSA infection nor to guide or monitor treatment for MRSA infections. Performed at [facility], [place] [address], [place], [place] [address]     Radiology Studies: US Abdomen Limited RUQ  Result Date: 01/16/2019 CLINICAL DATA:  Gallstone pancreatitis. EXAM: ULTRASOUND ABDOMEN LIMITED RIGHT UPPER QUADRANT COMPARISON:  CT abdomen/pelvis 01/12/2019 FINDINGS: The examination is somewhat limited due to patient body habitus. Gallbladder: There are echogenic foci within the body and neck of the gallbladder consistent with gallstones demonstrated on CT abdomen/pelvis 01/12/2019. No gallbladder wall thickening. No sonographic Percell Miller sign is elicited by the scanning technologist. Common bile duct: Diameter: The common duct is mildly dilated measuring up to 8 mm in diameter. Liver: No focal lesion identified. Mildly increased hepatic parenchymal echogenicity. Interrogated portal vein is patent on color Doppler imaging with normal direction of blood flow towards the liver. Other: The pancreas is not visualized. IMPRESSION:  Cholecystolithiasis without sonographic evidence of acute cholecystitis. Mildly dilated common duct measuring up to 8 mm in diameter. Mildly increased hepatic parenchymal echogenicity. This is a nonspecific finding, which may be seen in the  setting of hepatic steatosis or other chronic hepatic parenchymal disease. Electronically Signed   By: Kellie Simmering DO   On: 01/16/2019 13:27    Scheduled Meds: . carvedilol  12.5 mg Oral BID WC  . cephALEXin  500 mg Oral Q12H  . Chlorhexidine Gluconate Cloth  6 each Topical Daily  . insulin aspart  0-5 Units Subcutaneous QHS  . insulin aspart  0-9 Units Subcutaneous TID WC  . insulin aspart  5 Units Subcutaneous TID WC  . insulin glargine  35 Units Subcutaneous Daily  . isosorbide mononitrate  30 mg Oral Daily  . latanoprost  1 drop Both Eyes QHS  . mouth rinse  15 mL Mouth Rinse BID  . phosphorus  250 mg Oral TID  . regadenoson  0.4 mg Intravenous Once   Continuous Infusions: . sodium chloride 75 mL/hr at 01/17/19 0953  . heparin 1,700 Units/hr (01/17/19 0953)     LOS: 5 days    Time spent: 35 mins.    Shawna Clamp, MD [facility]   If 7PM-7AM, please contact night-coverage

## 2019-01-17 NOTE — Progress Notes (Signed)
Progress Note  Patient Name: [name] Date of Encounter: 01/17/2019  Primary Cardiologist: Jamie Furbish, MD   Subjective  LFTS continue to improve, lipase has normalized.  She reports persistent abdominal pain, denies any chest pain.  Inpatient Medications    Scheduled Meds: . carvedilol  12.5 mg Oral BID WC  . cephALEXin  500 mg Oral Q12H  . Chlorhexidine Gluconate Cloth  6 each Topical Daily  . insulin aspart  0-5 Units Subcutaneous QHS  . insulin aspart  0-9 Units Subcutaneous TID WC  . insulin aspart  5 Units Subcutaneous TID WC  . insulin glargine  35 Units Subcutaneous Daily  . isosorbide mononitrate  30 mg Oral Daily  . latanoprost  1 drop Both Eyes QHS  . mouth rinse  15 mL Mouth Rinse BID  . phosphorus  250 mg Oral TID   Continuous Infusions: . sodium chloride 75 mL/hr at 01/16/19 2157  . heparin 1,700 Units/hr (01/17/19 0424)   PRN Meds: dextrose, HYDROcodone-acetaminophen, ipratropium-albuterol, methocarbamol, morphine injection, ondansetron (ZOFRAN) IV   Vital Signs    Vitals:   01/17/19 0200 01/17/19 0400 01/17/19 0600 01/17/19 0849  BP: (!) 155/56 (!) 171/86 (!) 182/89   Pulse: 68 64 91   Resp: (!) 23 20 (!) 27   Temp:    98.6 F (37 C)  TempSrc:    Oral  SpO2: 96% 96% 97%   Weight:      Height:        Intake/Output Summary (Last 24 hours) at 01/17/2019 0910 Last data filed at 01/17/2019 0655 Gross per 24 hour  Intake 1558.7 ml  Output 1100 ml  Net 458.7 ml   Last 3 Weights 01/12/2019 12/30/2018 12/09/2018  Weight (lbs) 229 lb 229 lb 234 lb  Weight (kg) 103.874 kg 103.874 kg 106.142 kg      Telemetry    Sinus rhythm.  Rare PVCs.  - Personally Reviewed  ECG    n/a - Personally Reviewed  Physical Exam   VS:  BP (!) 182/89   Pulse 91   Temp 98.6 F (37 C) (Oral)   Resp (!) 27   Ht 5\' 3"  (1.6 m)   Wt 103.9 kg   SpO2 97%   BMI 40.57 kg/m  , BMI Body mass index is 40.57 kg/m.  GEN: in no acute distress HEENT: normal Neck: no  JVD Cardiac: RRR; no murmurs Respiratory:  clear to auscultation bilaterally GI: soft, TTP  Jamie: no deformity or atrophy Skin: warm and dry, no rash Neuro:  Alert and Oriented x 3 Psych: normal affect     Labs    High Sensitivity Troponin:   Recent Labs  Lab 01/12/19 1857 01/13/19 0651 01/13/19 0849 01/13/19 1044 01/13/19 1356  TROPONINIHS 86* 688* 843* 1,023* 1,023*      Chemistry Recent Labs  Lab 01/15/19 0204 01/16/19 0214 01/17/19 0132  NA 134* 139 141  K 4.5 4.3 4.2  CL 104 109 110  CO2 20* 23 24  GLUCOSE 313* 211* 188*  BUN 33* 30* 26*  CREATININE 1.58* 1.43* 1.35*  CALCIUM 8.1* 8.4* 8.6*  PROT 6.4* 6.3* 6.1*  ALBUMIN 2.7* 2.4* 2.4*  AST 71* 34 29  ALT 167* 114* 80*  ALKPHOS 109 95 123  BILITOT 1.6* 1.0 0.9  GFRNONAA 33* 38* 40*  GFRAA 39* 44* 47*  ANIONGAP 10 7 7      Hematology Recent Labs  Lab 01/15/19 0204 01/16/19 0214 01/17/19 0132  WBC 11.7* 12.5* 11.1*  RBC 3.87 3.50* 3.30*  HGB 12.3 11.3* 10.5*  HCT 39.3 35.0* 33.6*  MCV 101.6* 100.0 101.8*  MCH 31.8 32.3 31.8  MCHC 31.3 32.3 31.3  RDW 13.5 13.6 13.7  PLT 120* 117* 129*    BNP Recent Labs  Lab 01/12/19 1857  BNP 88.3     DDimer No results for input(s): DDIMER in the last 168 hours.   Radiology    US Abdomen Limited RUQ  Result Date: 01/16/2019 CLINICAL DATA:  Gallstone pancreatitis. EXAM: ULTRASOUND ABDOMEN LIMITED RIGHT UPPER QUADRANT COMPARISON:  CT abdomen/pelvis 01/12/2019 FINDINGS: The examination is somewhat limited due to patient body habitus. Gallbladder: There are echogenic foci within the body and neck of the gallbladder consistent with gallstones demonstrated on CT abdomen/pelvis 01/12/2019. No gallbladder wall thickening. No sonographic [name] sign is elicited by the scanning technologist. Common bile duct: Diameter: The common duct is mildly dilated measuring up to 8 mm in diameter. Liver: No focal lesion identified. Mildly increased hepatic parenchymal  echogenicity. Interrogated portal vein is patent on color Doppler imaging with normal direction of blood flow towards the liver. Other: The pancreas is not visualized. IMPRESSION: Cholecystolithiasis without sonographic evidence of acute cholecystitis. Mildly dilated common duct measuring up to 8 mm in diameter. Mildly increased hepatic parenchymal echogenicity. This is a nonspecific finding, which may be seen in the setting of hepatic steatosis or other chronic hepatic parenchymal disease. Electronically Signed   By: Jamie Simmering DO   On: 01/16/2019 13:27    Cardiac Studies   Echo 01/13/19: IMPRESSIONS   1. Suboptimal image quality for diagnosis of biventricular function and wall motion.  2. Left ventricular ejection fraction, by visual estimation, is 50%. The left ventricle has limited views for evaluation of function. Possible apical wall motion abnormality. There is mildly increased left ventricular hypertrophy.  3. Global right ventricle was not well visualized.The right ventricular size is not well visualized. Right vetricular wall thickness was not assessed.  4. Left atrial size was not well visualized.  5. Right atrial size was not well visualized.  6. Mild mitral annular calcification.  7. The mitral valve is abnormal. No evidence of mitral valve regurgitation. No evidence of mitral stenosis.  8. The tricuspid valve is not well visualized.  9. The aortic valve was not well visualized. Aortic valve regurgitation is not visualized. No evidence of aortic valve sclerosis or stenosis.  Patient Profile     [name] is a 29F with CAD s/p CABG in [id number], chronic systolic and diastolic heart failure LVEF 40 to 45%, VT status post ICD, diabetes, hypertension, OSA, morbid obesity, and OSA admitted with gallstone pancreatitis.  Cardiology consulted for elevated troponin.  Assessment & Plan    # Elevated troponin: Troponin elevated from 688-->843--1023.  She has no chest pain.  This is likely  demand ischemia.  Echo revealed LVEF 50%, up from 40-45% previously.  Given that surgical options are being considered, we will get a Lexiscan Myoview.  -Will follow up Myoview today  # Chronic systolic and diastolic heart failure: Hold home lasix and spironolactone as she is receiving IV fluids.  Recommend being judicious with volume resuscitation.  Continue carvedilol and Imdur.  Consider an SGLT2 inhibitor as an outpatient.   # VT: S/p ICD.  Will give IV metoprolol given that she is NPO.  Device interrogation as above.  Not an active issue.  # Hypertension: Home lisinopril and spironolactone on hold.  Started carvedilol 12.5 mg twice daily  # Hyperlipidemia: Continue rosuvastatin  when taking oral medication.   # Prior DVT:  Xarelto on hold.  Continue heparin.       For questions or updates, please contact [place] Please consult [email or web address] for contact info under        Signed, Donato Heinz, MD  01/17/2019, 9:10 AM

## 2019-01-17 NOTE — Progress Notes (Signed)
Patient in no distress. Flexicon portion of test complete

## 2019-01-17 NOTE — Progress Notes (Signed)
PT Cancellation Note  Patient Details Name: [name] MRN: [id number] DOB: 06-27-1950   Cancelled Treatment:     Pt at [facility] for a stress test.  Will attempt to see another day.   Rica Koyanagi  PTA Acute  Rehabilitation Services Pager      [phone] Office      [phone]

## 2019-01-17 NOTE — Progress Notes (Signed)
[place] for IV heparin Indication: Hx DVT  Allergies  Allergen Reactions  . Lipitor  [Atorvastatin Calcium] Rash  . Metformin And Related Itching, Swelling and Other (See Comments)    Leg pain & swelling in legs  . Atorvastatin Itching and Rash  . Levofloxacin Itching   Patient Measurements: Height: 5\' 3"  (160 cm) Weight: 229 lb (103.9 kg) IBW/kg (Calculated) : 52.4 Heparin Dosing Weight: 77 kg  Vital Signs: Temp: 98.9 F (37.2 C) (01/09 1542) Temp Source: Oral (01/09 1542) BP: 143/80 (01/09 1600) Pulse Rate: 78 (01/09 1600)  Labs: Recent Labs    01/15/19 0204 01/16/19 0214 01/17/19 0132 01/17/19 1328  HGB 12.3 11.3* 10.5*  --   HCT 39.3 35.0* 33.6*  --   PLT 120* 117* 129*  --   HEPARINUNFRC 0.23* 0.37 0.15* 0.37  CREATININE 1.58* 1.43* 1.35*  --     Estimated Creatinine Clearance: 46 mL/min (A) (by C-G formula based on SCr of 1.35 mg/dL (H)).  Medications:  PTA Xarelto 20mg  daily - Last dose 01/11/19 @ 22:00  Scheduled:  . carvedilol  12.5 mg Oral BID WC  . cephALEXin  500 mg Oral Q12H  . Chlorhexidine Gluconate Cloth  6 each Topical Daily  . insulin aspart  0-5 Units Subcutaneous QHS  . insulin aspart  0-9 Units Subcutaneous TID WC  . insulin aspart  5 Units Subcutaneous TID WC  . insulin glargine  35 Units Subcutaneous Daily  . isosorbide mononitrate  30 mg Oral Daily  . latanoprost  1 drop Both Eyes QHS  . mouth rinse  15 mL Mouth Rinse BID  . phosphorus  250 mg Oral TID   Infusions:  . sodium chloride 75 mL/hr at 01/17/19 1138  . heparin 1,700 Units/hr (01/17/19 1339)    Assessment: 48 yoF with extensive cardiac history including DVT on Xarelto, CAD s/p CABG 2007, HFrEF with AICD, HTN, HLD, DM, presents with n/v d/t pancreatitis. Pharmacy consulted to dose IV heparin while Xarelto on hold   Prior anticoagulation: Xarelto 20 mg daily, last dose 01/11/19  Significant events:  Today, 01/17/2019:  0130 Heparin  level low, 0.15 units/ml, increased rate to 1700 units/hr  Hgb and plts trending down - monitor closely  No bleeding or infusion issues noted  Heparin level ordered for 12N, obtained ~ 4pm was in range at 0.37 units/ml  Goal of Therapy: Heparin level 0.3-0.7 units/ml Monitor platelets by anticoagulation protocol: Yes  Plan:  Continue Heparin infusion at 1700 units/hr  Daily CBC and heparin level  Monitor for signs of bleeding or thrombosis  F/u for ability to transition back to Laurita Quint PharmD 01/17/2019, 4:54 PM

## 2019-01-18 ENCOUNTER — Other Ambulatory Visit: Payer: Self-pay

## 2019-01-18 DIAGNOSIS — I472 Ventricular tachycardia: Secondary | ICD-10-CM

## 2019-01-18 LAB — LIPASE, BLOOD: Lipase: 48 U/L (ref 11–51)

## 2019-01-18 LAB — BASIC METABOLIC PANEL
Anion gap: 8 (ref 5–15)
BUN: 17 mg/dL (ref 8–23)
CO2: 23 mmol/L (ref 22–32)
Calcium: 8.3 mg/dL — ABNORMAL LOW (ref 8.9–10.3)
Chloride: 108 mmol/L (ref 98–111)
Creatinine, Ser: 1.03 mg/dL — ABNORMAL HIGH (ref 0.44–1.00)
GFR calc Af Amer: >60 mL/min (ref >60)
GFR calc non Af Amer: 56 mL/min — ABNORMAL LOW (ref >60)
Glucose, Bld: 99 mg/dL (ref 70–99)
Potassium: 3.7 mmol/L (ref 3.5–5.1)
Sodium: 139 mmol/L (ref 135–145)

## 2019-01-18 LAB — CBC
HCT: 33.4 % — ABNORMAL LOW (ref 36.0–46.0)
Hemoglobin: 10.7 g/dL — ABNORMAL LOW (ref 12.0–15.0)
MCH: 32.1 pg (ref 26.0–34.0)
MCHC: 32 g/dL (ref 30.0–36.0)
MCV: 100.3 fL — ABNORMAL HIGH (ref 80.0–100.0)
Platelets: 130 10*3/uL — ABNORMAL LOW (ref 150–400)
RBC: 3.33 MIL/uL — ABNORMAL LOW (ref 3.87–5.11)
RDW: 13.8 % (ref 11.5–15.5)
WBC: 11.2 10*3/uL — ABNORMAL HIGH (ref 4.0–10.5)
nRBC: 0.3 % — ABNORMAL HIGH (ref 0.0–0.2)

## 2019-01-18 LAB — GLUCOSE, CAPILLARY
Glucose-Capillary: 74 mg/dL (ref 70–99)
Glucose-Capillary: 155 mg/dL — ABNORMAL HIGH (ref 70–99)
Glucose-Capillary: 134 mg/dL — ABNORMAL HIGH (ref 70–99)
Glucose-Capillary: 169 mg/dL — ABNORMAL HIGH (ref 70–99)

## 2019-01-18 LAB — HEPARIN LEVEL (UNFRACTIONATED): Heparin Unfractionated: 0.58 IU/mL (ref 0.30–0.70)

## 2019-01-18 LAB — PHOSPHORUS: Phosphorus: 1.6 mg/dL — ABNORMAL LOW (ref 2.5–4.6)

## 2019-01-18 MED ORDER — MAGNESIUM SULFATE IN D5W 1-5 GM/100ML-% IV SOLN
1.0000 g | Freq: Once | INTRAVENOUS | Status: AC
  Administered 2019-01-18: 1 g via INTRAVENOUS
  Filled 2019-01-18: qty 100

## 2019-01-18 MED ORDER — SODIUM CHLORIDE 0.9 % WEIGHT BASED INFUSION
3.0000 mL/kg/h | INTRAVENOUS | Status: DC
  Administered 2019-01-19: 3 mL/kg/h via INTRAVENOUS

## 2019-01-18 MED ORDER — SODIUM CHLORIDE 0.9% FLUSH: 3.0000 mL | INTRAVENOUS | Status: DC | PRN

## 2019-01-18 MED ORDER — SODIUM CHLORIDE 0.9 % WEIGHT BASED INFUSION
1.0000 mL/kg/h | INTRAVENOUS | Status: DC
  Administered 2019-01-19: 1 mL/kg/h via INTRAVENOUS

## 2019-01-18 MED ORDER — POTASSIUM CHLORIDE 20 MEQ PO PACK
40.0000 meq | PACK | Freq: Once | ORAL | Status: AC
  Administered 2019-01-18: 40 meq via ORAL
  Filled 2019-01-18: qty 2

## 2019-01-18 MED ORDER — ASPIRIN 81 MG PO CHEW
81.0000 mg | CHEWABLE_TABLET | ORAL | Status: AC
  Administered 2019-01-19: 06:00:00 81 mg via ORAL
  Filled 2019-01-18: qty 1

## 2019-01-18 MED ORDER — CARVEDILOL 12.5 MG PO TABS
12.5000 mg | ORAL_TABLET | Freq: Once | ORAL | Status: AC
  Administered 2019-01-18: 12.5 mg via ORAL
  Filled 2019-01-18: qty 1

## 2019-01-18 MED ORDER — SODIUM CHLORIDE 0.9% FLUSH
3.0000 mL | Freq: Two times a day (BID) | INTRAVENOUS | Status: DC
  Administered 2019-01-18 – 2019-01-19 (×2): 3 mL via INTRAVENOUS

## 2019-01-18 MED ORDER — GUAIFENESIN-DM 100-10 MG/5ML PO SYRP
5.0000 mL | ORAL_SOLUTION | ORAL | Status: DC | PRN
  Administered 2019-01-18 (×2): 5 mL via ORAL
  Filled 2019-01-18 (×2): qty 10

## 2019-01-18 MED ORDER — K PHOS MONO-SOD PHOS DI & MONO 155-852-130 MG PO TABS
500.0000 mg | ORAL_TABLET | Freq: Three times a day (TID) | ORAL | Status: AC
  Administered 2019-01-18 (×3): 500 mg via ORAL
  Filled 2019-01-18 (×3): qty 2

## 2019-01-18 MED ORDER — SODIUM CHLORIDE 0.9 % IV SOLN: 250.0000 mL | INTRAVENOUS | Status: DC | PRN

## 2019-01-18 MED ORDER — CARVEDILOL 25 MG PO TABS
25.0000 mg | ORAL_TABLET | Freq: Two times a day (BID) | ORAL | Status: DC
  Administered 2019-01-18 – 2019-01-20 (×4): 25 mg via ORAL
  Filled 2019-01-18 (×2): qty 2
  Filled 2019-01-18: qty 1
  Filled 2019-01-18: qty 2
  Filled 2019-01-18 (×3): qty 1
  Filled 2019-01-18: qty 2

## 2019-01-18 NOTE — Progress Notes (Signed)
PROGRESS NOTE    [name]  [id number] DOB: Jan 01, 1951 DOA: 01/12/2019   PCP: Everardo Beals, NP   Brief Narrative:   Patient is a 69 year old female with history of coronary artery disease status post CABG, chronic systolic heart failure status post AICD, history of V. tach, diabetes type 2, chronic kidney disease stage III, history of DVT on anticoagulation who presents to the emergency department with complaints of worsening abdominal pain.  She was complaining  of generalized abdominal pain mostly in the epigastric area with nausea and vomiting since last 3 weeks.  She was not able to tolerate food or medications .  Pain worsened over 24 to 48 hours.  On presentation she was afebrile.  She was found to have elevated liver enzymes, elevated lipase.  Elevated troponin.  EKG showed nonspecific ST changes. Denied any chest pain.  CT abdomen/pelvis was concerning for acute pancreatitis. She had elevated anion gap, low CO2 concerning for DKA.    Patient was admitted for the management of pancreatitis, possible NSTEMI, DKA, UTI.  Cardiology and GI following.  Currently on heparin drip.  Cardiology suggests this is most likely secondary to demand ischemia, initially  there was no plan for intervention.  DKA resolved, anion gap closed.  She started back on Lantus and sliding scale.  CT abdomen pelvis consistent with gallstones pancreatitis, general surgery consulted for possible cholecystectomy. General surgery requires medical and cardiology clearance before proceeding with cholecystectomy once acute pancreatitis resolves. Patient had abnormal Lexiscan stress test yesterday and Polymorphic VT on telemetry, she was shocked while sleeping, Cardiology planning to cardiac cath tomorrow before cardiac clearance. Pancreatitis has resolved.   Assessment & Plan:   Principal Problem:   Acute pancreatitis Active Problems:   S/P CABG x 5   Chronic combined systolic and diastolic heart failure  (HCC)   AKI (acute kidney injury) (HCC)   DVT (deep venous thrombosis) (HCC)   DKA (diabetic ketoacidoses) (HCC)   Abnormal LFTs   Demand ischemia (HCC)  Acute pancreatitis - Resolved. Most likely gallstone pancreatitis.  Presented with epigastric abdominal pain.  Pain is better this morning.  Diet advanced to soft diet, GI following.  CT abdomen/pelvis showed features of pancreatitis, gallstones.  Elevated liver enzymes, lipase and these are all improving .  Not a candidate for ERCP as per GI.  General surgery consultated for possible cholecystectomy , requires medical and cardiology clearance before proceeding with cholecystectomy, once pancreatitis has resolved.  Lipase has normalized, abdominal pain resolved.  Elevated troponin/ Abnormal stress test:  Significantly elevated troponin on presentation.  Denies any  chest pain.  Nonspecific changes in the EKG.  Echocardiogram  showed ejection fraction of 50%, possible apical wall motion abnormality.  Cardiology closely following.  This is most likely secondary to demand ischemia. Initially there was no plan for intervention.  She has history of coronary artery disease and is status post CABG. she had abnormal nuclear stress test pending cardiology clearance for cholecystectomy.  Plan is cardiac cath tomorrow.  Suspected DKA- Resolved. Low CO2, elevated anion gap on presentation .  History of diabetes type 2.  On insulin at home.  Anionic gap closed,  Off insulin drip now.  Started on Lantus and sliding scale.  Continue monitoring sugars.  Insulin dose adjusted today.  Diabetic oriented following.  Chronic combined  CHF: Currently euvolemic .  Has AICD.  Pacemaker interrogated without finding of VT.  Echocardiogram as above . Cardiology following.  She is on carvedilol, Lasix, Imdur, lisinopril,  statin, spironolactone .  Lasix and spironolactone  on hold because she is on gentle IV fluids for pancreatitis.  01/18/19 Polymorphic VT on telemetry, she  was shocked while sleeping, Cardiology planning to cardiac cath tomorrow before cardiac clearance.  Acute on CKD stage IIIa: Improving Secondary to vomiting, poor oral intake.  Continue to monitor.  Continue IV fluids. Monitor kidney function.  Baseline creatinine around 1.2. DC IV fluids.  Suspected UTI:  UA was suggestive of UTI.  Urine culture showed proteus .  Denies dysuria.  Abx changed to oral keflex.  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  On Xarelto at home.  On heparin drip currently because she might need cholecystectomy in the near future during this hospitalization.  Lactic acidosis: Secondary to dehydration.  Lactate level improved.  Low suspicion for infectious etiology.  Debility/deconditioning: PT evaluated her and recommended home health.   DVT prophylaxis: IV heparin Code Status: Full code Family Communication: Discussed with patient at bed side.  Called son today and gave update today. Disposition Plan: Likely home after clinical stability. She might need cholecystectomy.  PT recommended home health.  Consultants: GI, cardiology, surgery  Procedures: None.  Antimicrobials:  Anti-infectives (From admission, onward)   Start     Dose/Rate Route Frequency Ordered Stop   01/15/19 1400  cephALEXin (KEFLEX) capsule 500 mg     500 mg Oral Every 12 hours 01/15/19 1357 01/17/19 2120   01/14/19 1400  cefTRIAXone (ROCEPHIN) 1 g in sodium chloride 0.9 % 100 mL IVPB  Status:  Discontinued     1 g 200 mL/hr over 30 Minutes Intravenous Every 24 hours 01/14/19 1206 01/15/19 1357   01/13/19 2200  cefTRIAXone (ROCEPHIN) 2 g in sodium chloride 0.9 % 100 mL IVPB  Status:  Discontinued     2 g 200 mL/hr over 30 Minutes Intravenous Every 24 hours 01/12/19 2338 01/13/19 1400   01/12/19 2345  metroNIDAZOLE (FLAGYL) IVPB 500 mg  Status:  Discontinued     500 mg 100 mL/hr over 60 Minutes Intravenous Every 8 hours 01/12/19 2338 01/13/19 1400   01/12/19  2300  cefTRIAXone (ROCEPHIN) 2 g in sodium chloride 0.9 % 100 mL IVPB     2 g 200 mL/hr over 30 Minutes Intravenous  Once 01/12/19 2247 01/13/19 0023   01/12/19 2245  cefTRIAXone (ROCEPHIN) 1 g in sodium chloride 0.9 % 100 mL IVPB  Status:  Discontinued     1 g 200 mL/hr over 30 Minutes Intravenous  Once 01/12/19 2242 01/12/19 2247      Subjective: Patient was seen and examined at bedside, She states " feeling blessed", She denies any nausea, vomiting, diarrhea.  She is aware that her stress test is abnormal and requires cardiac catheterization.   Objective: Vitals:   01/18/19 0600 01/18/19 0800 01/18/19 0849 01/18/19 1100  BP: (!) 161/71 (!) 145/69 130/68 (!) 175/80  Pulse: 68 71 70 75  Resp: (!) 22 20 20  (!) 28  Temp:   98.1 F (36.7 C)   TempSrc:   Oral   SpO2: 96% 96% 99% 98%  Weight:      Height:        Intake/Output Summary (Last 24 hours) at 01/18/2019 1133 Last data filed at 01/18/2019 1120 Gross per 24 hour  Intake 1984.5 ml  Output 400 ml  Net 1584.5 ml   Filed Weights   01/12/19 1740  Weight: 103.9 kg    Examination:  General exam: Not in distress, morbidly obese.  Respiratory system: Bilateral equal air entry, clear to auscultation. Respiratory effort normal. Cardiovascular system: S1 & S2 heard, RRR. No JVD, murmurs, rubs, gallops or clicks. No pedal edema. AICD noted. Gastrointestinal system: Abdomen is nondistended, soft and mildly tender in the epigastric region.. No organomegaly or masses felt. Normal bowel sounds heard. Central nervous system: Alert and oriented. No focal neurological deficits. Extremities: No edema, no clubbing, no cyanosis Skin: No rashes, lesions or ulcers Psychiatry: Judgement and insight appear normal. Mood & affect appropriate.    Data Reviewed: I have personally reviewed following labs and imaging studies  CBC: Recent Labs  Lab 01/12/19 1857 01/14/19 0140 01/15/19 0204 01/16/19 0214 01/17/19 0132 01/18/19 0123  WBC  13.4* 14.2* 11.7* 12.5* 11.1* 11.2*  NEUTROABS 12.3* 11.6*  --   --   --   --   HGB 14.6 13.7 12.3 11.3* 10.5* 10.7*  HCT 45.2 43.2 39.3 35.0* 33.6* 33.4*  MCV 101.3* 101.6* 101.6* 100.0 101.8* 100.3*  PLT 164 146* 120* 117* 129* [id number]*   Basic Metabolic Panel: Recent Labs  Lab 01/14/19 0140 01/15/19 0204 01/16/19 0214 01/17/19 0132 01/18/19 0123  NA 137 134* 139 141 139  K 4.7 4.5 4.3 4.2 3.7  CL 107 104 109 110 108  CO2 19* 20* 23 24 23   GLUCOSE 259* 313* 211* 188* 99  BUN 39* 33* 30* 26* 17  CREATININE 1.83* 1.58* 1.43* 1.35* 1.03*  CALCIUM 8.2* 8.1* 8.4* 8.6* 8.3*  MG  --   --   --  2.4  --   PHOS  --   --   --  1.7* 1.6*   GFR: Estimated Creatinine Clearance: 60.2 mL/min (A) (by C-G formula based on SCr of 1.03 mg/dL (H)). Liver Function Tests: Recent Labs  Lab 01/13/19 0651 01/14/19 0140 01/15/19 0204 01/16/19 0214 01/17/19 0132  AST 358* 153* 71* 34 29  ALT 439* 265* 167* 114* 80*  ALKPHOS 156* 131* 109 95 123  BILITOT 3.0* 2.5* 1.6* 1.0 0.9  PROT 7.3 6.8 6.4* 6.3* 6.1*  ALBUMIN 3.1* 3.0* 2.7* 2.4* 2.4*   Recent Labs  Lab 01/12/19 1857 01/15/19 0204 01/17/19 0132 01/18/19 0123  LIPASE 4,497* 359* 42 48   No results for input(s): AMMONIA in the last 168 hours. Coagulation Profile: Recent Labs  Lab 01/13/19 0651  INR 1.7*   Cardiac Enzymes: No results for input(s): CKTOTAL, CKMB, CKMBINDEX, TROPONINI in the last 168 hours. BNP (last 3 results) No results for input(s): PROBNP in the last 8760 hours. HbA1C: No results for input(s): HGBA1C in the last 72 hours. CBG: Recent Labs  Lab 01/17/19 0836 01/17/19 1320 01/17/19 1525 01/17/19 2114 01/17/19 2330  GLUCAP 138* 124* 170* 214* 122*   Lipid Profile: No results for input(s): CHOL, HDL, LDLCALC, TRIG, CHOLHDL, LDLDIRECT in the last 72 hours. Thyroid Function Tests: No results for input(s): TSH, T4TOTAL, FREET4, T3FREE, THYROIDAB in the last 72 hours. Anemia Panel: No results for input(s):  VITAMINB12, FOLATE, FERRITIN, TIBC, IRON, RETICCTPCT in the last 72 hours. Sepsis Labs: Recent Labs  Lab 01/12/19 1857 01/13/19 0341 01/14/19 0140 01/15/19 0204  LATICACIDVEN 6.1* 2.9* 2.5* 1.9    Recent Results (from the past 240 hour(s))  SARS CORONAVIRUS 2 (TAT 6-24 HRS) Nasopharyngeal Nasopharyngeal Swab     Status: None   Collection Time: 01/12/19  8:45 PM   Specimen: Nasopharyngeal Swab  Result Value Ref Range Status   SARS Coronavirus 2 NEGATIVE NEGATIVE Final    Comment: (NOTE) SARS-CoV-2 target nucleic acids are  NOT DETECTED. The SARS-CoV-2 RNA is generally detectable in upper and lower respiratory specimens during the acute phase of infection. Negative results do not preclude SARS-CoV-2 infection, do not rule out co-infections with other pathogens, and should not be used as the sole basis for treatment or other patient management decisions. Negative results must be combined with clinical observations, patient history, and epidemiological information. The expected result is Negative. Fact Sheet for Patients: SugarRoll.be Fact Sheet for Healthcare Providers: https://www.woods-mathews.com/ This test is not yet approved or cleared by the [place] FDA and  has been authorized for detection and/or diagnosis of SARS-CoV-2 by FDA under an Emergency Use Authorization (EUA). This EUA will remain  in effect (meaning this test can be used) for the duration of the COVID-19 declaration under Section 56 4(b)(1) of the Act, 21 U.S.C. section 360bbb-3(b)(1), unless the authorization is terminated or revoked sooner. Performed at [facility], [place] [address], [place], [place] [address]   Urine culture     Status: Abnormal   Collection Time: 01/12/19 10:43 PM   Specimen: Urine, Clean Catch  Result Value Ref Range Status   Specimen Description   Final    URINE, CLEAN CATCH Performed at [facility], [place]  [address] [place], [place] [address]    Special Requests   Final    NONE Performed at [facility], [place] [address] [place], [place] [address]    Culture >=100,000 COLONIES/mL PROTEUS MIRABILIS (A)  Final   Report Status 01/15/2019 FINAL  Final   Organism ID, Bacteria PROTEUS MIRABILIS (A)  Final      Susceptibility   Proteus mirabilis - MIC*    AMPICILLIN <=2 SENSITIVE Sensitive     CEFAZOLIN <=4 SENSITIVE Sensitive     CEFTRIAXONE <=0.25 SENSITIVE Sensitive     CIPROFLOXACIN <=0.25 SENSITIVE Sensitive     GENTAMICIN <=1 SENSITIVE Sensitive     IMIPENEM 2 SENSITIVE Sensitive     NITROFURANTOIN 128 RESISTANT Resistant     TRIMETH/SULFA <=20 SENSITIVE Sensitive     AMPICILLIN/SULBACTAM <=2 SENSITIVE Sensitive     PIP/TAZO <=4 SENSITIVE Sensitive     * >=100,000 COLONIES/mL PROTEUS MIRABILIS  MRSA PCR Screening     Status: None   Collection Time: 01/13/19  5:43 PM   Specimen: Nasopharyngeal  Result Value Ref Range Status   MRSA by PCR NEGATIVE NEGATIVE Final    Comment:        The GeneXpert MRSA Assay (FDA approved for NASAL specimens only), is one component of a comprehensive MRSA colonization surveillance program. It is not intended to diagnose MRSA infection nor to guide or monitor treatment for MRSA infections. Performed at [facility], [place] [address] [place], [place] [address]     Radiology Studies: NM Myocar Multi W/Spect W/Wall Motion / EF  Result Date: 01/17/2019 CLINICAL DATA:  Preoperative study prior to intermediate risk surgery. EXAM: MYOCARDIAL IMAGING WITH SPECT (REST AND PHARMACOLOGIC-STRESS) GATED LEFT VENTRICULAR WALL MOTION STUDY LEFT VENTRICULAR EJECTION FRACTION TECHNIQUE: Standard myocardial SPECT imaging was performed after resting intravenous injection of 10.23 mCi Tc-31m tetrofosmin. Subsequently, intravenous infusion of Lexiscan was performed under the supervision of the Cardiology staff. At peak effect of the  drug, 32.5 mCi Tc-10m tetrofosmin was injected intravenously and standard myocardial SPECT imaging was performed. Quantitative gated imaging was also performed to evaluate left ventricular wall motion, and estimate left ventricular ejection fraction. COMPARISON:  May 30, 2014 FINDINGS: Perfusion: There is a large primarily fixed defect  in the septum, particularly from the mid wall to the apex which is worse when compared to 2016. There may be a small amount of reversibility in the anterior septum. There is a small fixed defect in the anterior wall towards the apex. There may be a small amount of reversibility in the anterior wall which is subtle. There is a large posterolateral wall defect which is stable. There is a large posterior wall defect which is stable. There is some preserved activity in the mid posterior wall. There is a large fixed defect at the apex. Wall Motion: Global hypokinesis. Dyskinesis in the septum. No thickening in the region of fixed defects. Left Ventricular Ejection Fraction: 123456 % End diastolic volume [id number] ml End systolic volume 0000000 ml IMPRESSION: 1. Fixed defects involve the septum, apex, anterior wall towards the apex, posterior wall, and posterolateral wall. The size of the fixed defects is worse in the septum in the interval. There may be a small amount of reversibility in the anterior septum as well as in the anterior wall. The possible mild reversibility a subtle. 2. Global hypokinesis. No thickening in the region of fixed defects. Dyskinesis of the septum. 3. Left ventricular ejection fraction 20% 4. Non invasive risk stratification*: High *2012 Appropriate Use Criteria for Coronary Revascularization Focused Update: J Am Coll Cardiol. [id number]. http://content.airportbarriers.com.aspx?articleid=1201161 Electronically Signed   By: Dorise Bullion III M.D   On: 01/17/2019 14:01   US Abdomen Limited RUQ  Result Date: 01/16/2019 CLINICAL DATA:  Gallstone pancreatitis. EXAM:  ULTRASOUND ABDOMEN LIMITED RIGHT UPPER QUADRANT COMPARISON:  CT abdomen/pelvis 01/12/2019 FINDINGS: The examination is somewhat limited due to patient body habitus. Gallbladder: There are echogenic foci within the body and neck of the gallbladder consistent with gallstones demonstrated on CT abdomen/pelvis 01/12/2019. No gallbladder wall thickening. No sonographic Percell Miller sign is elicited by the scanning technologist. Common bile duct: Diameter: The common duct is mildly dilated measuring up to 8 mm in diameter. Liver: No focal lesion identified. Mildly increased hepatic parenchymal echogenicity. Interrogated portal vein is patent on color Doppler imaging with normal direction of blood flow towards the liver. Other: The pancreas is not visualized. IMPRESSION: Cholecystolithiasis without sonographic evidence of acute cholecystitis. Mildly dilated common duct measuring up to 8 mm in diameter. Mildly increased hepatic parenchymal echogenicity. This is a nonspecific finding, which may be seen in the setting of hepatic steatosis or other chronic hepatic parenchymal disease. Electronically Signed   By: Kellie Simmering DO   On: 01/16/2019 13:27    Scheduled Meds: . carvedilol  25 mg Oral BID WC  . Chlorhexidine Gluconate Cloth  6 each Topical Daily  . insulin aspart  0-5 Units Subcutaneous QHS  . insulin aspart  0-9 Units Subcutaneous TID WC  . insulin aspart  5 Units Subcutaneous TID WC  . insulin glargine  35 Units Subcutaneous Daily  . isosorbide mononitrate  30 mg Oral Daily  . latanoprost  1 drop Both Eyes QHS  . mouth rinse  15 mL Mouth Rinse BID  . phosphorus  500 mg Oral TID   Continuous Infusions: . heparin 1,700 Units/hr (01/18/19 1120)  . magnesium sulfate bolus IVPB 100 mL/hr at 01/18/19 1120     LOS: 6 days    Time spent: 35 mins.    Shawna Clamp, MD [facility]   If 7PM-7AM, please contact night-coverage

## 2019-01-18 NOTE — Progress Notes (Addendum)
Progress Note    ASSESSMENT AND PLAN:   69 yo female with pmh as below including, but not limited to,  sleep apnea / glaucoma / DM2 / HTN / hx of DVT / CAD remote CABG / combined systolic and diastolic heart failure / AICD / BBB / arthritis / hx of colon polyps (TA) in 2018 for 5 year recall in Oct 2023  1. Acute biliary pancreatitis with cholelithiasis / abnormal liver tests. No bile duct dilationon CT scan. Not MRCP candidate.May have passed a stone. --pancreatitis improving. Needing a little less Morphine and Hydrocodone now. Tolerating full liquids to soft diet.  --Liver tests normalizing --Continue supportive care --Surgery following. Plan is for cholecystectomy when Cardiology evaluation complete.  --GI will sign off, call with questions.  2. UTI,Proteus MIrabilis.Currently on Keflex  3. AKI on CKD. Crimproving, down to 1.03 (at baseline).  4. Hx of CAD / elevated troponin, no chest pain. Cardiology has been following and feels likely demand ischemia, stress imaging yesterday, results pending.   5. Hx of DVT, home Xarelto on hold. Still on IV heparin.          SUBJECTIVE    Abdominal pain a lot better than when admitted. Tolerating soft diet she says. Sometimes feels nauseated but doesn't vomit.    OBJECTIVE:     Vital signs in last 24 hours: Temp:  [97.5 F (36.4 C)-99.4 F (37.4 C)] 98.1 F (36.7 C) (01/10 0849) Pulse Rate:  [63-78] 70 (01/10 0849) Resp:  [18-26] 20 (01/10 0849) BP: (118-169)/(54-111) 130/68 (01/10 0849) SpO2:  [94 %-99 %] 99 % (01/10 0849) Last BM Date: 01/12/19 General:   Alert in NAD Pulm: Normal respiratory effort   Abdomen:  Soft, nondistended, epigastric / LUQ discomfort with moderate palpation. A few bowel sounds.          Neurologic:  Alert and  oriented x4;  grossly normal neurologically. Psych:  Pleasant, cooperative.  Normal mood and affect.   Intake/Output from previous day: 01/09 0701 - 01/10 0700 In:  1473.7 [I.V.:1473.7] Out: -  Intake/Output this shift: Total I/O In: 1638.8 [I.V.:1638.8] Out: 200 [Urine:200]  Lab Results: Recent Labs    01/16/19 0214 01/17/19 0132 01/18/19 0123  WBC 12.5* 11.1* 11.2*  HGB 11.3* 10.5* 10.7*  HCT 35.0* 33.6* 33.4*  PLT 117* 129* 130*   BMET Recent Labs    01/16/19 0214 01/17/19 0132 01/18/19 0123  NA 139 141 139  K 4.3 4.2 3.7  CL 109 110 108  CO2 23 24 23   GLUCOSE 211* 188* 99  BUN 30* 26* 17  CREATININE 1.43* 1.35* 1.03*  CALCIUM 8.4* 8.6* 8.3*   LFT Recent Labs    01/17/19 0132  PROT 6.1*  ALBUMIN 2.4*  AST 29  ALT 80*  ALKPHOS 123  BILITOT 0.9   PT/INR No results for input(s): LABPROT, INR in the last 72 hours. Hepatitis Panel No results for input(s): HEPBSAG, HCVAB, HEPAIGM, HEPBIGM in the last 72 hours.  NM Myocar Multi W/Spect W/Wall Motion / EF  Result Date: 01/17/2019 CLINICAL DATA:  Preoperative study prior to intermediate risk surgery. EXAM: MYOCARDIAL IMAGING WITH SPECT (REST AND PHARMACOLOGIC-STRESS) GATED LEFT VENTRICULAR WALL MOTION STUDY LEFT VENTRICULAR EJECTION FRACTION TECHNIQUE: Standard myocardial SPECT imaging was performed after resting intravenous injection of 10.23 mCi Tc-55m tetrofosmin. Subsequently, intravenous infusion of Lexiscan was performed under the supervision of the Cardiology staff. At peak effect of the drug, 32.5 mCi Tc-59m tetrofosmin was injected intravenously and standard myocardial  SPECT imaging was performed. Quantitative gated imaging was also performed to evaluate left ventricular wall motion, and estimate left ventricular ejection fraction. COMPARISON:  May 30, 2014 FINDINGS: Perfusion: There is a large primarily fixed defect in the septum, particularly from the mid wall to the apex which is worse when compared to 2016. There may be a small amount of reversibility in the anterior septum. There is a small fixed defect in the anterior wall towards the apex. There may be a small amount  of reversibility in the anterior wall which is subtle. There is a large posterolateral wall defect which is stable. There is a large posterior wall defect which is stable. There is some preserved activity in the mid posterior wall. There is a large fixed defect at the apex. Wall Motion: Global hypokinesis. Dyskinesis in the septum. No thickening in the region of fixed defects. Left Ventricular Ejection Fraction: 123456 % End diastolic volume Q000111Q ml End systolic volume 0000000 ml IMPRESSION: 1. Fixed defects involve the septum, apex, anterior wall towards the apex, posterior wall, and posterolateral wall. The size of the fixed defects is worse in the septum in the interval. There may be a small amount of reversibility in the anterior septum as well as in the anterior wall. The possible mild reversibility a subtle. 2. Global hypokinesis. No thickening in the region of fixed defects. Dyskinesis of the septum. 3. Left ventricular ejection fraction 20% 4. Non invasive risk stratification*: High *2012 Appropriate Use Criteria for Coronary Revascularization Focused Update: J Am Coll Cardiol. [id number]. http://content.[email or web address].aspx?articleid=1201161 Electronically Signed   By: Dorise Bullion III M.D   On: 01/17/2019 14:01   US Abdomen Limited RUQ  Result Date: 01/16/2019 CLINICAL DATA:  Gallstone pancreatitis. EXAM: ULTRASOUND ABDOMEN LIMITED RIGHT UPPER QUADRANT COMPARISON:  CT abdomen/pelvis 01/12/2019 FINDINGS: The examination is somewhat limited due to patient body habitus. Gallbladder: There are echogenic foci within the body and neck of the gallbladder consistent with gallstones demonstrated on CT abdomen/pelvis 01/12/2019. No gallbladder wall thickening. No sonographic Percell Miller sign is elicited by the scanning technologist. Common bile duct: Diameter: The common duct is mildly dilated measuring up to 8 mm in diameter. Liver: No focal lesion identified. Mildly increased hepatic parenchymal  echogenicity. Interrogated portal vein is patent on color Doppler imaging with normal direction of blood flow towards the liver. Other: The pancreas is not visualized. IMPRESSION: Cholecystolithiasis without sonographic evidence of acute cholecystitis. Mildly dilated common duct measuring up to 8 mm in diameter. Mildly increased hepatic parenchymal echogenicity. This is a nonspecific finding, which may be seen in the setting of hepatic steatosis or other chronic hepatic parenchymal disease. Electronically Signed   By: Kellie Simmering DO   On: 01/16/2019 13:27      Principal Problem:   Acute pancreatitis Active Problems:   S/P CABG x 5   Chronic combined systolic and diastolic heart failure (HCC)   AKI (acute kidney injury) ([place])   DVT (deep venous thrombosis) (HCC)   DKA (diabetic ketoacidoses) ([place])   Abnormal LFTs   Demand ischemia ([place])     LOS: 6 days   Tye Savoy ,NP 01/18/2019, 9:16 AM  GI ATTENDING  Interval history and data reviewed.  Patient seen and examined.  Agree with interval progress note as outlined above.  Patient looking and feeling better.  Anticipate laparoscopic cholecystectomy when clinically appropriate.  Nothing further to add from GI medicine perspective.  Will sign off.  Docia Chuck. Geri Semin[place]., M.D. [facility] Division of Gastroenterology

## 2019-01-18 NOTE — Progress Notes (Signed)
[place] for IV heparin Indication: Hx DVT  Allergies  Allergen Reactions  . Lipitor  [Atorvastatin Calcium] Rash  . Metformin And Related Itching, Swelling and Other (See Comments)    Leg pain & swelling in legs  . Atorvastatin Itching and Rash  . Levofloxacin Itching   Patient Measurements: Height: 5\' 3"  (160 cm) Weight: 229 lb (103.9 kg) IBW/kg (Calculated) : 52.4 Heparin Dosing Weight: 77 kg  Vital Signs: Temp: 98.1 F (36.7 C) (01/10 0849) Temp Source: Oral (01/10 0849) BP: 130/68 (01/10 0849) Pulse Rate: 70 (01/10 0849)  Labs: Recent Labs    01/16/19 0214 01/17/19 0132 01/17/19 1328 01/18/19 0123  HGB 11.3* 10.5*  --  10.7*  HCT 35.0* 33.6*  --  33.4*  PLT 117* 129*  --  130*  HEPARINUNFRC 0.37 0.15* 0.37 0.58  CREATININE 1.43* 1.35*  --  1.03*   Estimated Creatinine Clearance: 60.2 mL/min (A) (by C-G formula based on SCr of 1.03 mg/dL (H)).  Medications:  PTA Xarelto 20mg  daily - Last dose 01/11/19 @ 22:00  Scheduled:  . carvedilol  12.5 mg Oral BID WC  . Chlorhexidine Gluconate Cloth  6 each Topical Daily  . insulin aspart  0-5 Units Subcutaneous QHS  . insulin aspart  0-9 Units Subcutaneous TID WC  . insulin aspart  5 Units Subcutaneous TID WC  . insulin glargine  35 Units Subcutaneous Daily  . isosorbide mononitrate  30 mg Oral Daily  . latanoprost  1 drop Both Eyes QHS  . mouth rinse  15 mL Mouth Rinse BID  . phosphorus  500 mg Oral TID   Infusions:  . sodium chloride 75 mL/hr at 01/18/19 [id number]  . heparin 1,700 Units/hr (01/18/19 [id number])   Assessment: 25 yoF with extensive cardiac history including DVT on Xarelto, CAD s/p CABG 2007, HFrEF with AICD, HTN, HLD, DM, presents with n/v d/t pancreatitis. Pharmacy consulted to dose IV heparin while Xarelto on hold   Prior anticoagulation: Xarelto 20 mg daily, last dose 01/11/19  Significant events:  Today, 01/18/2019:  0123 Heparin level in range 0.58  units/ml  Hgb 14.6 > 10.7, Plt 164 >> 130  Watch Hgb, Plt - consistently decreasing  No bleeding or infusion issues noted  Plan cholecystectomy this admit  Goal of Therapy: Heparin level 0.3-0.7 units/ml Monitor platelets by anticoagulation protocol: Yes  Plan:  Continue Heparin infusion at 1700 units/hr  Daily CBC and heparin level  Monitor for signs of bleeding or thrombosis  F/u for transition back to Xarelto after surgery  Minda Ditto PharmD 01/18/2019, 8:52 AM

## 2019-01-18 NOTE — Progress Notes (Addendum)
Progress Note  Patient Name: [name] Date of Encounter: 01/18/2019  Primary Cardiologist: Candee Furbish, MD   Subjective  Underwent [facility] yesterday, which showed fixed defects in apex, septum, anterior wall, posterior, and posterior wall.  Does appear to be some ischemia in the anterior/anteroseptum.  This  morning on telemetry appears she went tinto polymorphic VT around 7AM.  From tele, looks like ATP was unsuccessful and ultimately was shocked x1, with resolution of VT.  She denies feeling a shock this morning.  Denies any chest pain.  Inpatient Medications    Scheduled Meds: . carvedilol  12.5 mg Oral BID WC  . Chlorhexidine Gluconate Cloth  6 each Topical Daily  . insulin aspart  0-5 Units Subcutaneous QHS  . insulin aspart  0-9 Units Subcutaneous TID WC  . insulin aspart  5 Units Subcutaneous TID WC  . insulin glargine  35 Units Subcutaneous Daily  . isosorbide mononitrate  30 mg Oral Daily  . latanoprost  1 drop Both Eyes QHS  . mouth rinse  15 mL Mouth Rinse BID  . phosphorus  500 mg Oral TID   Continuous Infusions: . sodium chloride 75 mL/hr at 01/18/19 0518  . heparin 1,700 Units/hr (01/18/19 0517)   PRN Meds: dextrose, guaiFENesin-dextromethorphan, HYDROcodone-acetaminophen, ipratropium-albuterol, morphine injection, ondansetron (ZOFRAN) IV   Vital Signs    Vitals:   01/18/19 0014 01/18/19 0200 01/18/19 0400 01/18/19 0600  BP:  127/80 (!) 157/63 (!) 161/71  Pulse:  67 63 68  Resp:  (!) 22 (!) 21 (!) 22  Temp: 99.2 F (37.3 C)  98.3 F (36.8 C)   TempSrc: Axillary  Oral   SpO2:  97% 97% 96%  Weight:      Height:        Intake/Output Summary (Last 24 hours) at 01/18/2019 0818 Last data filed at 01/17/2019 1425 Gross per 24 hour  Intake 1473.72 ml  Output --  Net 1473.72 ml   Last 3 Weights 01/12/2019 12/30/2018 12/09/2018  Weight (lbs) 229 lb 229 lb 234 lb  Weight (kg) 103.874 kg 103.874 kg 106.142 kg      Telemetry    Episode of  polymorphic VT around 7AM.  Looks like ATP was unsuccessful and ultimately was shocked x1, with resolution of VT  - Personally Reviewed  ECG    NSR, LBBB, QTc 632 - Personally Reviewed  Physical Exam   VS:  BP (!) 161/71   Pulse 68   Temp 98.3 F (36.8 C) (Oral)   Resp (!) 22   Ht 5\' 3"  (1.6 m)   Wt 103.9 kg   SpO2 96%   BMI 40.57 kg/m  , BMI Body mass index is 40.57 kg/m.  GEN: in no acute distress HEENT: normal Neck: no JVD appreciated but difficult to assess given body habitus Cardiac: RRR; no murmurs Respiratory:  Scattered rhonchi GI: soft, TTP  MS: no deformity or atrophy Skin: warm and dry, no rash Neuro:  Alert and Oriented x 3 Psych: normal affect    Labs    High Sensitivity Troponin:   Recent Labs  Lab 01/12/19 1857 01/13/19 0651 01/13/19 0849 01/13/19 1044 01/13/19 1356  TROPONINIHS 86* 688* 843* 1,023* 1,023*      Chemistry Recent Labs  Lab 01/15/19 0204 01/16/19 0214 01/17/19 0132 01/18/19 0123  NA 134* 139 141 139  K 4.5 4.3 4.2 3.7  CL 104 109 110 108  CO2 20* 23 24 23   GLUCOSE 313* 211* 188* 99  BUN  33* 30* 26* 17  CREATININE 1.58* 1.43* 1.35* 1.03*  CALCIUM 8.1* 8.4* 8.6* 8.3*  PROT 6.4* 6.3* 6.1*  --   ALBUMIN 2.7* 2.4* 2.4*  --   AST 71* 34 29  --   ALT 167* 114* 80*  --   ALKPHOS 109 95 123  --   BILITOT 1.6* 1.0 0.9  --   GFRNONAA 33* 38* 40* 56*  GFRAA 39* 44* 47* >60  ANIONGAP 10 7 7 8      Hematology Recent Labs  Lab 01/16/19 0214 01/17/19 0132 01/18/19 0123  WBC 12.5* 11.1* 11.2*  RBC 3.50* 3.30* 3.33*  HGB 11.3* 10.5* 10.7*  HCT 35.0* 33.6* 33.4*  MCV 100.0 101.8* 100.3*  MCH 32.3 31.8 32.1  MCHC 32.3 31.3 32.0  RDW 13.6 13.7 13.8  PLT 117* 129* 130*    BNP Recent Labs  Lab 01/12/19 1857  BNP 88.3     DDimer No results for input(s): DDIMER in the last 168 hours.   Radiology    NM Myocar Multi W/Spect W/Wall Motion / EF  Result Date: 01/17/2019 CLINICAL DATA:  Preoperative study prior to  intermediate risk surgery. EXAM: MYOCARDIAL IMAGING WITH SPECT (REST AND PHARMACOLOGIC-STRESS) GATED LEFT VENTRICULAR WALL MOTION STUDY LEFT VENTRICULAR EJECTION FRACTION TECHNIQUE: Standard myocardial SPECT imaging was performed after resting intravenous injection of 10.23 mCi Tc-69m tetrofosmin. Subsequently, intravenous infusion of Lexiscan was performed under the supervision of the Cardiology staff. At peak effect of the drug, 32.5 mCi Tc-26m tetrofosmin was injected intravenously and standard myocardial SPECT imaging was performed. Quantitative gated imaging was also performed to evaluate left ventricular wall motion, and estimate left ventricular ejection fraction. COMPARISON:  May 30, 2014 FINDINGS: Perfusion: There is a large primarily fixed defect in the septum, particularly from the mid wall to the apex which is worse when compared to 2016. There may be a small amount of reversibility in the anterior septum. There is a small fixed defect in the anterior wall towards the apex. There may be a small amount of reversibility in the anterior wall which is subtle. There is a large posterolateral wall defect which is stable. There is a large posterior wall defect which is stable. There is some preserved activity in the mid posterior wall. There is a large fixed defect at the apex. Wall Motion: Global hypokinesis. Dyskinesis in the septum. No thickening in the region of fixed defects. Left Ventricular Ejection Fraction: 123456 % End diastolic volume Q000111Q ml End systolic volume 0000000 ml IMPRESSION: 1. Fixed defects involve the septum, apex, anterior wall towards the apex, posterior wall, and posterolateral wall. The size of the fixed defects is worse in the septum in the interval. There may be a small amount of reversibility in the anterior septum as well as in the anterior wall. The possible mild reversibility a subtle. 2. Global hypokinesis. No thickening in the region of fixed defects. Dyskinesis of the septum. 3. Left  ventricular ejection fraction 20% 4. Non invasive risk stratification*: High *2012 Appropriate Use Criteria for Coronary Revascularization Focused Update: J Am Coll Cardiol. [id number]. http://content.[email or web address].aspx?articleid=[email or web address] Electronically Signed   By: Dorise Bullion III M.D   On: 01/17/2019 14:01   US Abdomen Limited RUQ  Result Date: 01/16/2019 CLINICAL DATA:  Gallstone pancreatitis. EXAM: ULTRASOUND ABDOMEN LIMITED RIGHT UPPER QUADRANT COMPARISON:  CT abdomen/pelvis 01/12/2019 FINDINGS: The examination is somewhat limited due to patient body habitus. Gallbladder: There are echogenic foci within the body and neck of the gallbladder consistent with gallstones demonstrated on CT  abdomen/pelvis 01/12/2019. No gallbladder wall thickening. No sonographic Percell Miller sign is elicited by the scanning technologist. Common bile duct: Diameter: The common duct is mildly dilated measuring up to 8 mm in diameter. Liver: No focal lesion identified. Mildly increased hepatic parenchymal echogenicity. Interrogated portal vein is patent on color Doppler imaging with normal direction of blood flow towards the liver. Other: The pancreas is not visualized. IMPRESSION: Cholecystolithiasis without sonographic evidence of acute cholecystitis. Mildly dilated common duct measuring up to 8 mm in diameter. Mildly increased hepatic parenchymal echogenicity. This is a nonspecific finding, which may be seen in the setting of hepatic steatosis or other chronic hepatic parenchymal disease. Electronically Signed   By: Kellie Simmering DO   On: 01/16/2019 13:27     Cardiac Studies   Echo 01/13/19: IMPRESSIONS   1. Suboptimal image quality for diagnosis of biventricular function and wall motion.  2. Left ventricular ejection fraction, by visual estimation, is 50%. The left ventricle has limited views for evaluation of function. Possible apical wall motion abnormality. There is mildly increased left ventricular  hypertrophy.  3. Global right ventricle was not well visualized.The right ventricular size is not well visualized. Right vetricular wall thickness was not assessed.  4. Left atrial size was not well visualized.  5. Right atrial size was not well visualized.  6. Mild mitral annular calcification.  7. The mitral valve is abnormal. No evidence of mitral valve regurgitation. No evidence of mitral stenosis.  8. The tricuspid valve is not well visualized.  9. The aortic valve was not well visualized. Aortic valve regurgitation is not visualized. No evidence of aortic valve sclerosis or stenosis.  Lexiscan Myoview 01/17/19: 1. Fixed defects involve the septum, apex, anterior wall towards the apex, posterior wall, and posterolateral wall. The size of the fixed defects is worse in the septum in the interval. There may be a small amount of reversibility in the anterior septum as well as in the anterior wall. The possible mild reversibility a subtle. 2. Global hypokinesis. No thickening in the region of fixed defects. Dyskinesis of the septum. 3. Left ventricular ejection fraction 20% 4. Non invasive risk stratification*: High  Patient Profile     [name] is a 55F with CAD s/p CABG in 2007 (LIMA to D2, SVG to ramus intermediate, sequential SVG to OM1-OM2, SVG to RCA ), chronic systolic and diastolic heart failure LVEF 40 to 45%, VT status post ICD, diabetes, hypertension, OSA, morbid obesity, and OSA admitted with gallstone pancreatitis.  Cardiology consulted for elevated troponin.  Assessment & Plan    Elevated troponin: h/o CABG in 2007 (LIMA to D2, SVG to ramus intermediate, sequential SVG to OM1-OM2, SVG to RCA ).  Troponin elevated from 688-->843--1023.  She has no chest pain.  Echo was technically difficult study, but read as LVEF 50%, up from 40-45% previously.    Given that surgical options being considered, Lexiscan Myoview was ordered.  This showed fixed defects in apex, septum, anterior  wall, posterior, and posterior wall.  Does appear to be some ischemia in the anterior/anteroseptum.  EF 20%.  This morning had episode of polymorphic VT  - Myoview results suggest likely multiple grafts are down, but given anterior ischemia concerning for disease in LIMA/LAD.  She denies any chest pain, though reports she had no chest pain prior to her CABG.  Episode of polymorphic VT this morning could represent ischemia -Recommend cardiac catheterization.  Spoke with Dr Dwyane Dee this morning and pancreatitis appears resolved (?passed gallstone).  Cholecystectomy  is planned but given stress test results and polymorphic VT this morning, recommend patient undergo catheterization prior to surgery.  Risks and benefits of cardiac catheterization have been discussed with the patient.  These include bleeding, infection, kidney damage, stroke, heart attack, death.  The patient understands these risks and is willing to proceed.  Polymorphic VT: S/p ICD for VT, appeared to be shocked this morning by device for episode of polymorphic VT.  She did not feel anything.  Could be related to ischemia as above.  Also could be due to QT prolongation, as QT is prolonged on EKG today -Will contact [employer] rep to interrogate device   -Increase carvedilol to 25 mg BID -Will hold on amiodarone given QT prolongation -Maintain K>4, Mag>2.  Will give IV magnesium and PO potassium this morning -Avoid QT prolonging agents  Chronic systolic and diastolic heart failure: have been holding hold home lasix and spironolactone as she was receiving IV fluids for pancreatitis.  TTE showed EF 50% (though poor windows), was 20% on myoview -Given lipase has normalized and patient now taking a diet, would recommend stopping IV fluids. Will check BNP -Continue carvedilol and Imdur.  Consider an SGLT2 inhibitor as an outpatient.   Hypertension: Home lisinopril and spironolactone on hold.  Continued carvedilol 12.5 mg twice daily, will increase  to 25 mg BID  Hyperlipidemia: Continue rosuvastatin once LFTs normalized  Prior DVT:  Xarelto on hold.  Continue heparin.   CRITICAL CARE TIME: I have spent a total of 40 minutes with patient reviewing hospital notes, telemetry, EKGs, labs and examining the patient as well as establishing an assessment and plan that was discussed with the patient.  > 50% of time was spent in direct patient care. The patient is critically ill with life threatening ventricular arrhythmia this morning and requires high complexity decision making for assessment and support, frequent evaluation and titration of therapies, application of advanced monitoring technologies and extensive interpretation of multiple databases.   For questions or updates, please contact [place] Please consult [email or web address] for contact info under        Signed, Donato Heinz, MD  01/18/2019, 8:18 AM

## 2019-01-18 NOTE — Progress Notes (Signed)
Assessment & Plan: Biliary pancreatitis, cholelithiasis, chronic cholecystitis             Lipase, LFT's normalizing             Tolerating full liquid diet             Mild epigastric pain, tenderness now minimal             IV Rocephin Coronary artery disease             elevatedtroponin's/demand ischemia?             Hx CABG             Ischemic cardiomyopathy             Hx VT with AICD  Stress imaging performed yesterday at [facility] - results pending DKA/type 2 diabetes DVT on chronic anticoagulation             IV heparin Hypertension Hyperlipidemia Morbid obesity, BMI of 40.57  Patient will need cholecystectomy once medical stable and cardiology evaluation completed.  Probably early this week - Dr. Kieth Brightly to evaluate and schedule when clinically prepared.        Armandina Gemma, MD       [facility]       Office: [phone]   Chief Complaint: Abdominal pain, biliary pancreatitis  Subjective: Patient in bed in ICU, eating full liquid breakfast.  Intermittent abd pain, back pain.  Objective: Vital signs in last 24 hours: Temp:  [97.5 F (36.4 C)-99.4 F (37.4 C)] 98.3 F (36.8 C) (01/10 0400) Pulse Rate:  [63-78] 68 (01/10 0600) Resp:  [18-26] 22 (01/10 0600) BP: (118-169)/(54-111) 161/71 (01/10 0600) SpO2:  [94 %-97 %] 96 % (01/10 0600) Last BM Date: 01/12/19  Intake/Output from previous day: 01/09 0701 - 01/10 0700 In: 1473.7 [I.V.:1473.7] Out: -  Intake/Output this shift: No intake/output data recorded.  Physical Exam: HEENT - sclerae clear, mucous membranes moist Neck - soft Abdomen - soft, obese; minimal epigastric tenderness this AM; no mass; no guarding Neuro - alert & oriented, no focal deficits  Lab Results:  Recent Labs    01/17/19 0132 01/18/19 0123  WBC 11.1* 11.2*  HGB 10.5* 10.7*  HCT 33.6* 33.4*  PLT 129* 130*   BMET Recent Labs    01/17/19 0132 01/18/19 0123  NA 141 139  K 4.2 3.7  CL 110 108   CO2 24 23  GLUCOSE 188* 99  BUN 26* 17  CREATININE 1.35* 1.03*  CALCIUM 8.6* 8.3*   PT/INR No results for input(s): LABPROT, INR in the last 72 hours. Comprehensive Metabolic Panel:    Component Value Date/Time   NA 139 01/18/2019 0123   NA 141 01/17/2019 0132   NA 135 11/03/2018 1205   NA 135 01/31/2018 1029   K 3.7 01/18/2019 0123   K 4.2 01/17/2019 0132   CL 108 01/18/2019 0123   CL 110 01/17/2019 0132   CO2 23 01/18/2019 0123   CO2 24 01/17/2019 0132   BUN 17 01/18/2019 0123   BUN 26 (H) 01/17/2019 0132   BUN 32 (H) 11/03/2018 1205   BUN 22 01/31/2018 1029   CREATININE 1.03 (H) 01/18/2019 0123   CREATININE 1.35 (H) 01/17/2019 0132   CREATININE 0.88 09/20/2015 1153   CREATININE 1.11 (H) 05/19/2015 1503   GLUCOSE 99 01/18/2019 0123   GLUCOSE 188 (H) 01/17/2019 0132   CALCIUM 8.3 (L) 01/18/2019 0123   CALCIUM 8.6 (L) 01/17/2019  0132   AST 29 01/17/2019 0132   AST 34 01/16/2019 0214   ALT 80 (H) 01/17/2019 0132   ALT 114 (H) 01/16/2019 0214   ALKPHOS 123 01/17/2019 0132   ALKPHOS 95 01/16/2019 0214   BILITOT 0.9 01/17/2019 0132   BILITOT 1.0 01/16/2019 0214   BILITOT 0.5 11/03/2018 1205   BILITOT 0.7 08/05/2017 1048   PROT 6.1 (L) 01/17/2019 0132   PROT 6.3 (L) 01/16/2019 0214   PROT 7.6 11/03/2018 1205   PROT 7.5 08/05/2017 1048   ALBUMIN 2.4 (L) 01/17/2019 0132   ALBUMIN 2.4 (L) 01/16/2019 0214   ALBUMIN 4.2 11/03/2018 1205   ALBUMIN 4.1 08/05/2017 1048    Studies/Results: NM Myocar Multi W/Spect W/Wall Motion / EF  Result Date: 01/17/2019 CLINICAL DATA:  Preoperative study prior to intermediate risk surgery. EXAM: MYOCARDIAL IMAGING WITH SPECT (REST AND PHARMACOLOGIC-STRESS) GATED LEFT VENTRICULAR WALL MOTION STUDY LEFT VENTRICULAR EJECTION FRACTION TECHNIQUE: Standard myocardial SPECT imaging was performed after resting intravenous injection of 10.23 mCi Tc-28m tetrofosmin. Subsequently, intravenous infusion of Lexiscan was performed under the supervision  of the Cardiology staff. At peak effect of the drug, 32.5 mCi Tc-28m tetrofosmin was injected intravenously and standard myocardial SPECT imaging was performed. Quantitative gated imaging was also performed to evaluate left ventricular wall motion, and estimate left ventricular ejection fraction. COMPARISON:  May 30, 2014 FINDINGS: Perfusion: There is a large primarily fixed defect in the septum, particularly from the mid wall to the apex which is worse when compared to 2016. There may be a small amount of reversibility in the anterior septum. There is a small fixed defect in the anterior wall towards the apex. There may be a small amount of reversibility in the anterior wall which is subtle. There is a large posterolateral wall defect which is stable. There is a large posterior wall defect which is stable. There is some preserved activity in the mid posterior wall. There is a large fixed defect at the apex. Wall Motion: Global hypokinesis. Dyskinesis in the septum. No thickening in the region of fixed defects. Left Ventricular Ejection Fraction: 123456 % End diastolic volume [id number] ml End systolic volume 0000000 ml IMPRESSION: 1. Fixed defects involve the septum, apex, anterior wall towards the apex, posterior wall, and posterolateral wall. The size of the fixed defects is worse in the septum in the interval. There may be a small amount of reversibility in the anterior septum as well as in the anterior wall. The possible mild reversibility a subtle. 2. Global hypokinesis. No thickening in the region of fixed defects. Dyskinesis of the septum. 3. Left ventricular ejection fraction 20% 4. Non invasive risk stratification*: High *2012 Appropriate Use Criteria for Coronary Revascularization Focused Update: J Am Coll Cardiol. [id number]. http://content.airportbarriers.com.aspx?articleid=1201161 Electronically Signed   By: Dorise Bullion III M.D   On: 01/17/2019 14:01   US Abdomen Limited RUQ  Result Date:  01/16/2019 CLINICAL DATA:  Gallstone pancreatitis. EXAM: ULTRASOUND ABDOMEN LIMITED RIGHT UPPER QUADRANT COMPARISON:  CT abdomen/pelvis 01/12/2019 FINDINGS: The examination is somewhat limited due to patient body habitus. Gallbladder: There are echogenic foci within the body and neck of the gallbladder consistent with gallstones demonstrated on CT abdomen/pelvis 01/12/2019. No gallbladder wall thickening. No sonographic Percell Miller sign is elicited by the scanning technologist. Common bile duct: Diameter: The common duct is mildly dilated measuring up to 8 mm in diameter. Liver: No focal lesion identified. Mildly increased hepatic parenchymal echogenicity. Interrogated portal vein is patent on color Doppler imaging with normal direction of blood  flow towards the liver. Other: The pancreas is not visualized. IMPRESSION: Cholecystolithiasis without sonographic evidence of acute cholecystitis. Mildly dilated common duct measuring up to 8 mm in diameter. Mildly increased hepatic parenchymal echogenicity. This is a nonspecific finding, which may be seen in the setting of hepatic steatosis or other chronic hepatic parenchymal disease. Electronically Signed   By: Kellie Simmering DO   On: 01/16/2019 13:27      Armandina Gemma 01/18/2019  Patient ID: [name], female   DOB: [birth year], 69 y.o.   MRN: [id number]

## 2019-01-18 NOTE — Progress Notes (Signed)
ICD nterrogated by [facility] rep this afternoon.  Confirmed that patient had episode of polymorphic VT around 7AM this morning, that degenerated into VF and resolved with shock x 1.  Planning for cardiac catheterization tomorrow.

## 2019-01-19 ENCOUNTER — Encounter (HOSPITAL_COMMUNITY)
Admission: EM | Disposition: A | Discharge status: Home Health | Payer: Self-pay | Source: Home / Self Care | Attending: Internal Medicine

## 2019-01-19 DIAGNOSIS — I2510 Atherosclerotic heart disease of native coronary artery without angina pectoris: Secondary | ICD-10-CM | POA: Diagnosis not present

## 2019-01-19 HISTORY — PX: LEFT HEART CATH AND CORS/GRAFTS ANGIOGRAPHY: CATH118250

## 2019-01-19 LAB — CBC
HCT: 32.5 % — ABNORMAL LOW (ref 36.0–46.0)
Hemoglobin: 10.2 g/dL — ABNORMAL LOW (ref 12.0–15.0)
MCH: 31.5 pg (ref 26.0–34.0)
MCHC: 31.4 g/dL (ref 30.0–36.0)
MCV: 100.3 fL — ABNORMAL HIGH (ref 80.0–100.0)
Platelets: 131 10*3/uL — ABNORMAL LOW (ref 150–400)
RBC: 3.24 MIL/uL — ABNORMAL LOW (ref 3.87–5.11)
RDW: 14.2 % (ref 11.5–15.5)
WBC: 10.1 10*3/uL (ref 4.0–10.5)
nRBC: 0.5 % — ABNORMAL HIGH (ref 0.0–0.2)

## 2019-01-19 LAB — BRAIN NATRIURETIC PEPTIDE: B Natriuretic Peptide: 533.7 pg/mL — ABNORMAL HIGH (ref 0.0–100.0)

## 2019-01-19 LAB — BASIC METABOLIC PANEL
Anion gap: 7 (ref 5–15)
BUN: 16 mg/dL (ref 8–23)
CO2: 23 mmol/L (ref 22–32)
Calcium: 8.1 mg/dL — ABNORMAL LOW (ref 8.9–10.3)
Chloride: 108 mmol/L (ref 98–111)
Creatinine, Ser: 1.05 mg/dL — ABNORMAL HIGH (ref 0.44–1.00)
GFR calc Af Amer: >60 mL/min (ref >60)
GFR calc non Af Amer: 55 mL/min — ABNORMAL LOW (ref >60)
Glucose, Bld: 152 mg/dL — ABNORMAL HIGH (ref 70–99)
Potassium: 3.5 mmol/L (ref 3.5–5.1)
Sodium: 138 mmol/L (ref 135–145)

## 2019-01-19 LAB — MAGNESIUM: Magnesium: 2.3 mg/dL (ref 1.7–2.4)

## 2019-01-19 LAB — HEPARIN LEVEL (UNFRACTIONATED): Heparin Unfractionated: 0.41 IU/mL (ref 0.30–0.70)

## 2019-01-19 LAB — PHOSPHORUS: Phosphorus: 2.9 mg/dL (ref 2.5–4.6)

## 2019-01-19 LAB — GLUCOSE, CAPILLARY
Glucose-Capillary: 134 mg/dL — ABNORMAL HIGH (ref 70–99)
Glucose-Capillary: 85 mg/dL (ref 70–99)
Glucose-Capillary: 85 mg/dL (ref 70–99)
Glucose-Capillary: 185 mg/dL — ABNORMAL HIGH (ref 70–99)

## 2019-01-19 SURGERY — LEFT HEART CATH AND CORS/GRAFTS ANGIOGRAPHY
Anesthesia: LOCAL

## 2019-01-19 MED ORDER — HEPARIN SODIUM (PORCINE) 1000 UNIT/ML IJ SOLN
INTRAMUSCULAR | Status: DC | PRN
  Administered 2019-01-19: 5500 [IU] via INTRAVENOUS

## 2019-01-19 MED ORDER — SODIUM CHLORIDE 0.9 % IV SOLN: 250.0000 mL | INTRAVENOUS | Status: DC | PRN

## 2019-01-19 MED ORDER — LABETALOL HCL 5 MG/ML IV SOLN: 10.0000 mg | INTRAVENOUS | Status: AC | PRN

## 2019-01-19 MED ORDER — LIDOCAINE HCL (PF) 1 % IJ SOLN
INTRAMUSCULAR | Status: DC | PRN
  Administered 2019-01-19: 5 mL via INTRADERMAL

## 2019-01-19 MED ORDER — SODIUM CHLORIDE 0.9% FLUSH: 3.0000 mL | INTRAVENOUS | Status: DC | PRN

## 2019-01-19 MED ORDER — SODIUM CHLORIDE 0.9 % IV SOLN: INTRAVENOUS | Status: AC

## 2019-01-19 MED ORDER — POTASSIUM CHLORIDE 10 MEQ/100ML IV SOLN
10.0000 meq | INTRAVENOUS | Status: AC
  Administered 2019-01-19: 10 meq via INTRAVENOUS
  Filled 2019-01-19: qty 100

## 2019-01-19 MED ORDER — OXYCODONE HCL 5 MG PO TABS: 5.0000 mg | ORAL_TABLET | ORAL | Status: DC | PRN

## 2019-01-19 MED ORDER — VERAPAMIL HCL 2.5 MG/ML IV SOLN: INTRAVENOUS | Status: DC | PRN

## 2019-01-19 MED ORDER — ONDANSETRON HCL 4 MG/2ML IJ SOLN: 4.0000 mg | Freq: Four times a day (QID) | INTRAMUSCULAR | Status: DC | PRN

## 2019-01-19 MED ORDER — SODIUM CHLORIDE 0.9% FLUSH
3.0000 mL | Freq: Two times a day (BID) | INTRAVENOUS | Status: DC
  Administered 2019-01-19 – 2019-01-21 (×4): 3 mL via INTRAVENOUS

## 2019-01-19 MED ORDER — MIDAZOLAM HCL 2 MG/2ML IJ SOLN
INTRAMUSCULAR | Status: AC
  Filled 2019-01-19: qty 2

## 2019-01-19 MED ORDER — FENTANYL CITRATE (PF) 100 MCG/2ML IJ SOLN
INTRAMUSCULAR | Status: AC
  Filled 2019-01-19: qty 2

## 2019-01-19 MED ORDER — VERAPAMIL HCL 2.5 MG/ML IV SOLN
INTRAVENOUS | Status: AC
  Filled 2019-01-19: qty 2

## 2019-01-19 MED ORDER — HEPARIN (PORCINE) IN NACL 1000-0.9 UT/500ML-% IV SOLN
INTRAVENOUS | Status: DC | PRN
  Administered 2019-01-19 (×5): 500 mL

## 2019-01-19 MED ORDER — HEPARIN (PORCINE) IN NACL 1000-0.9 UT/500ML-% IV SOLN
INTRAVENOUS | Status: AC
  Filled 2019-01-19: qty 1000

## 2019-01-19 MED ORDER — RIVAROXABAN 20 MG PO TABS
20.0000 mg | ORAL_TABLET | Freq: Every day | ORAL | Status: DC
  Administered 2019-01-20: 20 mg via ORAL
  Filled 2019-01-19: qty 1

## 2019-01-19 MED ORDER — ACETAMINOPHEN 325 MG PO TABS
650.0000 mg | ORAL_TABLET | ORAL | Status: DC | PRN
  Administered 2019-01-19 – 2019-01-20 (×2): 650 mg via ORAL
  Filled 2019-01-19 (×2): qty 2

## 2019-01-19 MED ORDER — MIDAZOLAM HCL 2 MG/2ML IJ SOLN
INTRAMUSCULAR | Status: DC | PRN
  Administered 2019-01-19: 0.5 mg via INTRAVENOUS

## 2019-01-19 MED ORDER — HEPARIN SODIUM (PORCINE) 5000 UNIT/ML IJ SOLN
5000.0000 [IU] | Freq: Three times a day (TID) | INTRAMUSCULAR | Status: AC
  Administered 2019-01-19 – 2019-01-20 (×2): 5000 [IU] via SUBCUTANEOUS
  Filled 2019-01-19 (×2): qty 1

## 2019-01-19 MED ORDER — HEPARIN SODIUM (PORCINE) 1000 UNIT/ML IJ SOLN
INTRAMUSCULAR | Status: AC
  Filled 2019-01-19: qty 1

## 2019-01-19 MED ORDER — FENTANYL CITRATE (PF) 100 MCG/2ML IJ SOLN
INTRAMUSCULAR | Status: DC | PRN
  Administered 2019-01-19: 25 ug via INTRAVENOUS

## 2019-01-19 MED ORDER — HYDRALAZINE HCL 20 MG/ML IJ SOLN: 10.0000 mg | INTRAMUSCULAR | Status: AC | PRN

## 2019-01-19 MED ORDER — LIDOCAINE HCL (PF) 1 % IJ SOLN
INTRAMUSCULAR | Status: AC
  Filled 2019-01-19: qty 30

## 2019-01-19 SURGICAL SUPPLY — 12 items
CATH INFINITI 5FR JL4 (CATHETERS) ×1 IMPLANT
CATH INFINITI 5FR MPB2 (CATHETERS) ×1 IMPLANT
CATH INFINITI JR4 5F (CATHETERS) ×1 IMPLANT
DEVICE RAD COMP TR BAND LRG (VASCULAR PRODUCTS) ×1 IMPLANT
ELECT DEFIB PAD ADLT CADENCE (PAD) ×1 IMPLANT
GLIDESHEATH SLEND A-KIT 6F 22G (SHEATH) ×1 IMPLANT
GUIDEWIRE INQWIRE 1.5J.035X260 (WIRE) IMPLANT
INQWIRE 1.5J .035X260CM (WIRE) ×2
KIT HEART LEFT (KITS) ×2 IMPLANT
PACK CARDIAC CATHETERIZATION (CUSTOM PROCEDURE TRAY) ×2 IMPLANT
TRANSDUCER W/STOPCOCK (MISCELLANEOUS) ×2 IMPLANT
TUBING CIL FLEX 10 FLL-RA (TUBING) ×2 IMPLANT

## 2019-01-19 NOTE — Interval H&P Note (Signed)
Cath Lab Visit (complete for each Cath Lab visit)  Clinical Evaluation Leading to the Procedure:   ACS: Yes.    Non-ACS:    Anginal Classification: CCS Tran  Anti-ischemic medical therapy: Minimal Therapy (1 class of medications)  Non-Invasive Test Results: No non-invasive testing performed  Prior CABG: Previous CABG      History and Physical Interval Note:  01/19/2019 2:09 PM  Coronary bypass graft surgery x5, using a left internal mammary artery graft  to the second diagonal branch of the LAD, a saphenous vein graft to the  distal right coronary artery, a saphenous vein graft to the intermediate  coronary artery, and a sequential saphenous vein graft to the first and  second obtuse marginal branches of the left circumflex coronary artery.  Endoscopic vein harvesting from both legs.  [name]  has presented today for surgery, with the diagnosis of NSTEMI.  The various methods of treatment have been discussed with the patient and family. After consideration of risks, benefits and other options for treatment, the patient has consented to  Procedure(s): RIGHT/LEFT HEART CATH AND CORONARY ANGIOGRAPHY (N/A) as a surgical intervention.  The patient's history has been reviewed, patient examined, no change in status, stable for surgery.  I have reviewed the patient's chart and labs.  Questions were answered to the patient's satisfaction.     [name]

## 2019-01-19 NOTE — Care Plan (Signed)
  Received pt from [facility] via [place]. Pt alert and oriented X4, skin warm and dry.  IVF infusing,Consent signed, Heparin and Potassium infusing.  Pt on monitor.

## 2019-01-19 NOTE — Progress Notes (Signed)
PROGRESS NOTE    [name]  [id number] DOB: [birth year] DOA: 01/12/2019   PCP: Everardo Beals, NP   Brief Narrative:   Patient is a 69 year old female with history of coronary artery disease status post CABG, chronic systolic heart failure status post AICD, history of V. tach, diabetes type 2, chronic kidney disease stage III, history of DVT on anticoagulation who presents to the emergency department with complaints of worsening abdominal pain.  She was complaining  of generalized abdominal pain mostly in the epigastric area with nausea and vomiting since last 3 weeks.  She was not able to tolerate food or medications .  Pain worsened over 24 to 48 hours.  On presentation she was afebrile.  She was found to have elevated liver enzymes, elevated lipase.  Elevated troponin.  EKG showed nonspecific ST changes. Denied any chest pain.  CT abdomen/pelvis was concerning for acute pancreatitis. She had elevated anion gap, low CO2 concerning for DKA.    Patient was admitted for the management of pancreatitis, possible NSTEMI, DKA, UTI.  Cardiology and GI following.  Currently on heparin drip.  Cardiology suggests this is most likely secondary to demand ischemia, initially  there was no plan for intervention.  DKA resolved, anion gap closed.  She started back on Lantus and sliding scale.  CT abdomen pelvis consistent with gallstones pancreatitis, general surgery consulted for possible cholecystectomy. General surgery requires medical and cardiology clearance before proceeding with cholecystectomy once acute pancreatitis resolves. Patient had abnormal Lexiscan stress test yesterday and Polymorphic VT on telemetry, she was shocked while sleeping, Cardiology planning to cardiac cath tomorrow before cardiac clearance. Pancreatitis has resolved.   Assessment & Plan:   Principal Problem:   S/P CABG x 5 Active Problems:   Chronic combined systolic and diastolic heart failure (HCC)   AKI (acute kidney  injury) (HCC)   DVT (deep venous thrombosis) (HCC)   Acute pancreatitis   DKA (diabetic ketoacidoses) (HCC)   Abnormal LFTs   Demand ischemia (HCC)  Acute pancreatitis - Resolved. Most likely gallstone pancreatitis.  Presented with epigastric abdominal pain.  Pain is better this morning.  Diet advanced to soft diet, GI following.  CT abdomen/pelvis showed features of pancreatitis, gallstones.  Elevated liver enzymes, lipase and these are all improving .  Not a candidate for ERCP as per GI.  General surgery consultated for possible cholecystectomy , requires medical and cardiology clearance before proceeding with cholecystectomy, once pancreatitis has resolved.  Lipase has normalized, abdominal pain resolved. GI signed off. Surgery is planning cholecystectomy when cardiology evaluation is complete.  Elevated troponin/ Abnormal stress test:  Chronic systolic CHF NSVT SP ICD shock 01/18/2019  Significantly elevated troponin on presentation.  Denies any  chest pain.  Nonspecific changes in the EKG.  Echocardiogram  showed ejection fraction of 50%, possible apical wall motion abnormality.  Cardiology closely following.  This is most likely secondary to demand ischemia. Initially there was no plan for intervention.  She has history of coronary artery disease and is status post CABG. she had abnormal nuclear stress test cardiology was consulted. Cardiac cath completed.  Management per cardiology.  Suspected DKA- Resolved. Low CO2, elevated anion gap on presentation .  History of diabetes type 2.  On insulin at home.  Anionic gap closed,  Off insulin drip now.  Started on Lantus and sliding scale.  Continue monitoring sugars.  Insulin dose adjusted today.  Diabetic oriented following.  Chronic combined  CHF: Currently euvolemic .  Has AICD.  Pacemaker  interrogated without finding of VT.  Echocardiogram as above . Cardiology following.  She is on carvedilol, Lasix, Imdur, lisinopril, statin,  spironolactone .  Lasix and spironolactone  on hold because she is on gentle IV fluids for pancreatitis.  01/18/19 Polymorphic VT on telemetry, she was shocked while sleeping, underwent cardiac catheterization.  Management per cardiology.  Acute on CKD stage IIIa: Improving Secondary to vomiting, poor oral intake.  Continue to monitor.    Treated IV fluids. Monitor kidney function.  Baseline creatinine around 1.2. DC IV fluids.  Suspected UTI:  UA was suggestive of UTI.  Urine culture showed proteus .  Denies dysuria.  Abx changed to oral keflex.  Completed therapy.  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  On Xarelto at home.  On heparin drip currently because she might need cholecystectomy in the near future during this hospitalization.  Lactic acidosis: Secondary to dehydration.  Lactate level improved.  Low suspicion for infectious etiology.  Debility/deconditioning: PT evaluated her and recommended home health.   DVT prophylaxis: IV heparin Code Status: Full code Family Communication: Discussed with patient at bed side.  Disposition Plan: Likely home after clinical stability. She might need cholecystectomy.  PT recommended home health.  Consultants: GI, cardiology, surgery  Procedures: Cardiac catheterization.  Antimicrobials:  Anti-infectives (From admission, onward)   Start     Dose/Rate Route Frequency Ordered Stop   01/15/19 1400  cephALEXin (KEFLEX) capsule 500 mg     500 mg Oral Every 12 hours 01/15/19 1357 01/17/19 2120   01/14/19 1400  cefTRIAXone (ROCEPHIN) 1 g in sodium chloride 0.9 % 100 mL IVPB  Status:  Discontinued     1 g 200 mL/hr over 30 Minutes Intravenous Every 24 hours 01/14/19 1206 01/15/19 1357   01/13/19 2200  cefTRIAXone (ROCEPHIN) 2 g in sodium chloride 0.9 % 100 mL IVPB  Status:  Discontinued     2 g 200 mL/hr over 30 Minutes Intravenous Every 24 hours 01/12/19 2338 01/13/19 1400   01/12/19 2345  metroNIDAZOLE (FLAGYL)  IVPB 500 mg  Status:  Discontinued     500 mg 100 mL/hr over 60 Minutes Intravenous Every 8 hours 01/12/19 2338 01/13/19 1400   01/12/19 2300  cefTRIAXone (ROCEPHIN) 2 g in sodium chloride 0.9 % 100 mL IVPB     2 g 200 mL/hr over 30 Minutes Intravenous  Once 01/12/19 2247 01/13/19 0023   01/12/19 2245  cefTRIAXone (ROCEPHIN) 1 g in sodium chloride 0.9 % 100 mL IVPB  Status:  Discontinued     1 g 200 mL/hr over 30 Minutes Intravenous  Once 01/12/19 2242 01/12/19 2247      Subjective: No chest pain no nausea no vomiting.  No fever no chills.  No diarrhea no constipation.   Objective: Vitals:   01/19/19 1650 01/19/19 1700 01/19/19 1705 01/19/19 1720  BP: (!) 121/55  (!) 113/37 (!) 94/48  Pulse: 60 62 61 60  Resp: (!) 22 (!) 25 (!) 24 (!) 23  Temp:      TempSrc:      SpO2: 98% 97% 98% 97%  Weight:      Height:        Intake/Output Summary (Last 24 hours) at 01/19/2019 1730 Last data filed at 01/19/2019 1200 Gross per 24 hour  Intake 413.4 ml  Output 500 ml  Net -86.6 ml   Filed Weights   01/12/19 1740 01/18/19 2106  Weight: 103.9 kg 118.7 kg    Examination:  General exam:  Not in distress, morbidly obese. Respiratory system: Bilateral equal air entry, clear to auscultation. Respiratory effort normal. Cardiovascular system: S1 & S2 heard, RRR. No JVD, murmurs, rubs, gallops or clicks. No pedal edema. AICD noted. Gastrointestinal system: Abdomen is nondistended, soft and mildly tender in the epigastric region.. No organomegaly or masses felt. Normal bowel sounds heard. Central nervous system: Alert and oriented. No focal neurological deficits. Extremities: No edema, no clubbing, no cyanosis Skin: No rashes, lesions or ulcers Psychiatry: Judgement and insight appear normal. Mood & affect appropriate.    Data Reviewed: I have personally reviewed following labs and imaging studies  CBC: Recent Labs  Lab 01/12/19 1857 01/14/19 0140 01/15/19 0204 01/16/19 0214  01/17/19 0132 01/18/19 0123 01/19/19 0200  WBC 13.4* 14.2* 11.7* 12.5* 11.1* 11.2* 10.1  NEUTROABS 12.3* 11.6*  --   --   --   --   --   HGB 14.6 13.7 12.3 11.3* 10.5* 10.7* 10.2*  HCT 45.2 43.2 39.3 35.0* 33.6* 33.4* 32.5*  MCV 101.3* 101.6* 101.6* 100.0 101.8* 100.3* 100.3*  PLT 164 146* 120* 117* 129* 130* [id number]*   Basic Metabolic Panel: Recent Labs  Lab 01/15/19 0204 01/16/19 0214 01/17/19 0132 01/18/19 0123 01/19/19 0200  NA 134* 139 141 139 138  K 4.5 4.3 4.2 3.7 3.5  CL 104 109 110 108 108  CO2 20* 23 24 23 23   GLUCOSE 313* 211* 188* 99 152*  BUN 33* 30* 26* 17 16  CREATININE 1.58* 1.43* 1.35* 1.03* 1.05*  CALCIUM 8.1* 8.4* 8.6* 8.3* 8.1*  MG  --   --  2.4  --  2.3  PHOS  --   --  1.7* 1.6* 2.9   GFR: Estimated Creatinine Clearance: 63.9 mL/min (A) (by C-G formula based on SCr of 1.05 mg/dL (H)). Liver Function Tests: Recent Labs  Lab 01/13/19 0651 01/14/19 0140 01/15/19 0204 01/16/19 0214 01/17/19 0132  AST 358* 153* 71* 34 29  ALT 439* 265* 167* 114* 80*  ALKPHOS 156* 131* 109 95 123  BILITOT 3.0* 2.5* 1.6* 1.0 0.9  PROT 7.3 6.8 6.4* 6.3* 6.1*  ALBUMIN 3.1* 3.0* 2.7* 2.4* 2.4*   Recent Labs  Lab 01/12/19 1857 01/15/19 0204 01/17/19 0132 01/18/19 0123  LIPASE 4,497* 359* 42 48   No results for input(s): AMMONIA in the last 168 hours. Coagulation Profile: Recent Labs  Lab 01/13/19 0651  INR 1.7*   Cardiac Enzymes: No results for input(s): CKTOTAL, CKMB, CKMBINDEX, TROPONINI in the last 168 hours. BNP (last 3 results) No results for input(s): PROBNP in the last 8760 hours. HbA1C: No results for input(s): HGBA1C in the last 72 hours. CBG: Recent Labs  Lab 01/18/19 1142 01/18/19 1630 01/18/19 2227 01/19/19 1240 01/19/19 1356  GLUCAP 155* 134* 169* 85 85   Lipid Profile: No results for input(s): CHOL, HDL, LDLCALC, TRIG, CHOLHDL, LDLDIRECT in the last 72 hours. Thyroid Function Tests: No results for input(s): TSH, T4TOTAL, FREET4,  T3FREE, THYROIDAB in the last 72 hours. Anemia Panel: No results for input(s): VITAMINB12, FOLATE, FERRITIN, TIBC, IRON, RETICCTPCT in the last 72 hours. Sepsis Labs: Recent Labs  Lab 01/12/19 1857 01/13/19 0341 01/14/19 0140 01/15/19 0204  LATICACIDVEN 6.1* 2.9* 2.5* 1.9    Recent Results (from the past 240 hour(s))  SARS CORONAVIRUS 2 (TAT 6-24 HRS) Nasopharyngeal Nasopharyngeal Swab     Status: None   Collection Time: 01/12/19  8:45 PM   Specimen: Nasopharyngeal Swab  Result Value Ref Range Status   SARS Coronavirus 2 NEGATIVE NEGATIVE  Final    Comment: (NOTE) SARS-CoV-2 target nucleic acids are NOT DETECTED. The SARS-CoV-2 RNA is generally detectable in upper and lower respiratory specimens during the acute phase of infection. Negative results do not preclude SARS-CoV-2 infection, do not rule out co-infections with other pathogens, and should not be used as the sole basis for treatment or other patient management decisions. Negative results must be combined with clinical observations, patient history, and epidemiological information. The expected result is Negative. Fact Sheet for Patients: SugarRoll.be Fact Sheet for Healthcare Providers: https://www.woods-mathews.com/ This test is not yet approved or cleared by the [place] FDA and  has been authorized for detection and/or diagnosis of SARS-CoV-2 by FDA under an Emergency Use Authorization (EUA). This EUA will remain  in effect (meaning this test can be used) for the duration of the COVID-19 declaration under Section 56 4(b)(1) of the Act, 21 U.S.C. section 360bbb-3(b)(1), unless the authorization is terminated or revoked sooner. Performed at [facility], [place] [address], [place], [place] [address]   Urine culture     Status: Abnormal   Collection Time: 01/12/19 10:43 PM   Specimen: Urine, Clean Catch  Result Value Ref Range Status   Specimen Description   Final     URINE, CLEAN CATCH Performed at [facility], [place] [address] [place], [place] [address]    Special Requests   Final    NONE Performed at [facility], [place] [address] [place], [place] [address]    Culture >=100,000 COLONIES/mL PROTEUS MIRABILIS (A)  Final   Report Status 01/15/2019 FINAL  Final   Organism ID, Bacteria PROTEUS MIRABILIS (A)  Final      Susceptibility   Proteus mirabilis - MIC*    AMPICILLIN <=2 SENSITIVE Sensitive     CEFAZOLIN <=4 SENSITIVE Sensitive     CEFTRIAXONE <=0.25 SENSITIVE Sensitive     CIPROFLOXACIN <=0.25 SENSITIVE Sensitive     GENTAMICIN <=1 SENSITIVE Sensitive     IMIPENEM 2 SENSITIVE Sensitive     NITROFURANTOIN 128 RESISTANT Resistant     TRIMETH/SULFA <=20 SENSITIVE Sensitive     AMPICILLIN/SULBACTAM <=2 SENSITIVE Sensitive     PIP/TAZO <=4 SENSITIVE Sensitive     * >=100,000 COLONIES/mL PROTEUS MIRABILIS  MRSA PCR Screening     Status: None   Collection Time: 01/13/19  5:43 PM   Specimen: Nasopharyngeal  Result Value Ref Range Status   MRSA by PCR NEGATIVE NEGATIVE Final    Comment:        The GeneXpert MRSA Assay (FDA approved for NASAL specimens only), is one component of a comprehensive MRSA colonization surveillance program. It is not intended to diagnose MRSA infection nor to guide or monitor treatment for MRSA infections. Performed at [facility], [place] [address], [place], [place] [address]     Radiology Studies: CARDIAC CATHETERIZATION  Result Date: 01/19/2019  Severe diffuse three-vessel coronary disease with functional total occlusion of the mid LAD, total occlusion of the large first diagonal, total occlusion of the proximal ramus intermedius, total occlusion of the proximal circumflex, and total occlusion of the mid RCA.  All vessels are heavily calcified.  Patent saphenous vein graft to the PDA.  Moderate diffuse disease beyond the graft insertion site  Patent  sequential saphenous vein graft to the first and second obtuse marginal.  Diffuse native vessel disease beyond the bypass graft insertion site.  Patent saphenous vein graft to the ramus intermedius.  Diffuse native vessel disease beyond the graft  insertion site.  Patent left internal mammary graft to the dominant diagonal.  Diffuse native vessel disease beyond the graft insertion site.  Chronic systolic heart failure with EF less than 35% and LVEDP 22 mmHg. RECOMMENDATIONS:  Guideline directed therapy for systolic heart failure.   Scheduled Meds: . [MAR Hold] carvedilol  25 mg Oral BID WC  . [MAR Hold] Chlorhexidine Gluconate Cloth  6 each Topical Daily  . heparin  5,000 Units Subcutaneous Q8H  . [MAR Hold] insulin aspart  0-5 Units Subcutaneous QHS  . [MAR Hold] insulin aspart  0-9 Units Subcutaneous TID WC  . [MAR Hold] insulin aspart  5 Units Subcutaneous TID WC  . [MAR Hold] insulin glargine  35 Units Subcutaneous Daily  . [MAR Hold] isosorbide mononitrate  30 mg Oral Daily  . [MAR Hold] latanoprost  1 drop Both Eyes QHS  . [MAR Hold] mouth rinse  15 mL Mouth Rinse BID  . sodium chloride flush  3 mL Intravenous Q12H   Continuous Infusions: . sodium chloride 50 mL/hr at 01/19/19 1600  . sodium chloride       LOS: 7 days    Time spent: 35 mins.    Berle Mull, MD [facility]   If 7PM-7AM, please contact night-coverage

## 2019-01-19 NOTE — Progress Notes (Signed)
ANTICOAGULATION CONSULT NOTE - Follow Up Consult  Pharmacy Consult for heparin Indication: hx DVT; ACS (PTA xarelto on hold)  Allergies  Allergen Reactions  . Lipitor  [Atorvastatin Calcium] Rash  . Metformin And Related Itching, Swelling and Other (See Comments)    Leg pain & swelling in legs  . Atorvastatin Itching and Rash  . Levofloxacin Itching    Patient Measurements: Height: 5\' 3"  (160 cm) Weight: 261 lb 11 oz (118.7 kg) IBW/kg (Calculated) : 52.4 Heparin Dosing Weight: 77 kg  Vital Signs: Temp: 97.9 F (36.6 C) (01/11 0800) Temp Source: Oral (01/11 0800) BP: 163/61 (01/11 0800) Pulse Rate: 63 (01/11 0800)  Labs: Recent Labs    01/17/19 0132 01/17/19 1328 01/18/19 0123 01/19/19 0200  HGB 10.5*  --  10.7* 10.2*  HCT 33.6*  --  33.4* 32.5*  PLT 129*  --  130* 131*  HEPARINUNFRC 0.15* 0.37 0.58 0.41  CREATININE 1.35*  --  1.03* 1.05*    Estimated Creatinine Clearance: 63.9 mL/min (A) (by C-G formula based on SCr of 1.05 mg/dL (H)).   Medications:  - on xarelto 20 mg daily PTA  Assessment: Patient's a 69 y.o F with hx DVT on xarelto PTA, presented to the ED on 01/12/19 with c/o abdominal pain and n/v.  She was diagnosed with acute pancreatitis and was transitioned to heparin drip on admission. She was also noted to have elevated troponin with abnormal stress test on 1/9.  Plan is to proceed with cardiac cath procedure on 1/11.  Today, 01/19/2019: - heparin level is therapeutic at 0.41 - cbc stable - no bleeding documented - plan for cholecystectomy when patient is more stable  Goal of Therapy:  Heparin level 0.3-0.7 units/ml Monitor platelets by anticoagulation protocol: Yes   Plan:  - continue heparin drip at 1700 units/hr - daily heparin level - monitor for s/s bleeding - please advise when heparin drip can be resumed for pt s/p cath procedure  Enslie Sahota P 01/19/2019,9:39 AM

## 2019-01-19 NOTE — CV Procedure (Signed)
   Left heart cath via left radial using real-time vascular ultrasound for arterial access.  Right heart cath not performed because peripheral IV was not possible.  Did not feel was appropriate to do right heart cath from the femoral even the patient's obesity and risk of bleeding.  Total occlusion of the large diagonal, diffuse disease and ultimate total occlusion of the mid LAD, total occlusion of the proximal ramus branch, total occlusion of the mid circumflex, and total occlusion of the mid RCA.  Left main is patent.  Patent saphenous vein graft to the distal right coronary/PDA.  Distal RCA branches have diffuse disease beyond graft insertion site.  Patent saphenous vein graft to the ramus intermedius.  Ramus has diffuse disease beyond graft insertion site.  Patent sequential saphenous vein graft to obtuse marginal 1 and 2 with both branches having diffuse disease distal to the graft insertion site.  Patent LIMA to the diagonal.  Global hypokinesis difficult to specifically estimate EF but appears to be less than 35%.  LVEDP 22 mmHg.

## 2019-01-19 NOTE — Progress Notes (Signed)
Progress Note  Patient Name: [name] Date of Encounter: 01/19/2019  Primary Cardiologist: Candee Furbish, MD   Subjective   Less abdominal pain going to [facility] for cath this am   Inpatient Medications    Scheduled Meds: . carvedilol  25 mg Oral BID WC  . Chlorhexidine Gluconate Cloth  6 each Topical Daily  . insulin aspart  0-5 Units Subcutaneous QHS  . insulin aspart  0-9 Units Subcutaneous TID WC  . insulin aspart  5 Units Subcutaneous TID WC  . insulin glargine  35 Units Subcutaneous Daily  . isosorbide mononitrate  30 mg Oral Daily  . latanoprost  1 drop Both Eyes QHS  . mouth rinse  15 mL Mouth Rinse BID  . sodium chloride flush  3 mL Intravenous Q12H   Continuous Infusions: . sodium chloride    . sodium chloride 1 mL/kg/hr (01/19/19 0453)  . heparin 1,700 Units/hr (01/19/19 0600)   PRN Meds: sodium chloride, dextrose, guaiFENesin-dextromethorphan, HYDROcodone-acetaminophen, ipratropium-albuterol, morphine injection, ondansetron (ZOFRAN) IV, sodium chloride flush   Vital Signs    Vitals:   01/19/19 0349 01/19/19 0604 01/19/19 0700 01/19/19 0719  BP: (!) 141/69   (!) 162/70  Pulse: 65 70 65 70  Resp: (!) 24 (!) 21 (!) 24 (!) 28  Temp:      TempSrc:      SpO2: 99% 98% 98% 96%  Weight:      Height:        Intake/Output Summary (Last 24 hours) at 01/19/2019 0759 Last data filed at 01/19/2019 0600 Gross per 24 hour  Intake 2221.54 ml  Output 900 ml  Net 1321.54 ml   Last 3 Weights 01/18/2019 01/12/2019 12/30/2018  Weight (lbs) 261 lb 11 oz 229 lb 229 lb  Weight (kg) 118.7 kg 103.874 kg 103.874 kg      Telemetry    NSR 01/19/2019   ECG    SR QT 582 prolonged lateral T wave inversions   Physical Exam   VS:  BP (!) 162/70   Pulse 70   Temp (!) 97 F (36.1 C) (Axillary)   Resp (!) 28   Ht 5\' 3"  (1.6 m)   Wt 118.7 kg   SpO2 96%   BMI 46.36 kg/m  , BMI Body mass index is 46.36 kg/m.  Obese black female  HEENT: normal Neck: no JVD Cardiac:  RRR; no murmurs distant AICD under left clavicle  Respiratory:  clear to auscultation bilaterally GI: soft, TTP  MS: no deformity or atrophy Skin: warm and dry, no rash Neuro:  Alert and Oriented x 3 Psych: normal affect     Labs    High Sensitivity Troponin:   Recent Labs  Lab 01/12/19 1857 01/13/19 0651 01/13/19 0849 01/13/19 1044 01/13/19 1356  TROPONINIHS 86* 688* 843* 1,023* 1,023*      Chemistry Recent Labs  Lab 01/15/19 0204 01/16/19 0214 01/17/19 0132 01/18/19 0123 01/19/19 0200  NA 134* 139 141 139 138  K 4.5 4.3 4.2 3.7 3.5  CL 104 109 110 108 108  CO2 20* 23 24 23 23   GLUCOSE 313* 211* 188* 99 152*  BUN 33* 30* 26* 17 16  CREATININE 1.58* 1.43* 1.35* 1.03* 1.05*  CALCIUM 8.1* 8.4* 8.6* 8.3* 8.1*  PROT 6.4* 6.3* 6.1*  --   --   ALBUMIN 2.7* 2.4* 2.4*  --   --   AST 71* 34 29  --   --   ALT 167* 114* 80*  --   --  ALKPHOS 109 95 123  --   --   BILITOT 1.6* 1.0 0.9  --   --   GFRNONAA 33* 38* 40* 56* 55*  GFRAA 39* 44* 47* >60 >60  ANIONGAP 10 7 7 8 7      Hematology Recent Labs  Lab 01/17/19 0132 01/18/19 0123 01/19/19 0200  WBC 11.1* 11.2* 10.1  RBC 3.30* 3.33* 3.24*  HGB 10.5* 10.7* 10.2*  HCT 33.6* 33.4* 32.5*  MCV 101.8* 100.3* 100.3*  MCH 31.8 32.1 31.5  MCHC 31.3 32.0 31.4  RDW 13.7 13.8 14.2  PLT 129* 130* 131*    BNP Recent Labs  Lab 01/12/19 1857 01/19/19 0200  BNP 88.3 533.7*     DDimer No results for input(s): DDIMER in the last 168 hours.   Radiology    NM Myocar Multi W/Spect W/Wall Motion / EF  Result Date: 01/17/2019 CLINICAL DATA:  Preoperative study prior to intermediate risk surgery. EXAM: MYOCARDIAL IMAGING WITH SPECT (REST AND PHARMACOLOGIC-STRESS) GATED LEFT VENTRICULAR WALL MOTION STUDY LEFT VENTRICULAR EJECTION FRACTION TECHNIQUE: Standard myocardial SPECT imaging was performed after resting intravenous injection of 10.23 mCi Tc-36m tetrofosmin. Subsequently, intravenous infusion of Lexiscan was performed  under the supervision of the Cardiology staff. At peak effect of the drug, 32.5 mCi Tc-55m tetrofosmin was injected intravenously and standard myocardial SPECT imaging was performed. Quantitative gated imaging was also performed to evaluate left ventricular wall motion, and estimate left ventricular ejection fraction. COMPARISON:  May 30, 2014 FINDINGS: Perfusion: There is a large primarily fixed defect in the septum, particularly from the mid wall to the apex which is worse when compared to 2016. There may be a small amount of reversibility in the anterior septum. There is a small fixed defect in the anterior wall towards the apex. There may be a small amount of reversibility in the anterior wall which is subtle. There is a large posterolateral wall defect which is stable. There is a large posterior wall defect which is stable. There is some preserved activity in the mid posterior wall. There is a large fixed defect at the apex. Wall Motion: Global hypokinesis. Dyskinesis in the septum. No thickening in the region of fixed defects. Left Ventricular Ejection Fraction: 123456 % End diastolic volume Q000111Q ml End systolic volume 0000000 ml IMPRESSION: 1. Fixed defects involve the septum, apex, anterior wall towards the apex, posterior wall, and posterolateral wall. The size of the fixed defects is worse in the septum in the interval. There may be a small amount of reversibility in the anterior septum as well as in the anterior wall. The possible mild reversibility a subtle. 2. Global hypokinesis. No thickening in the region of fixed defects. Dyskinesis of the septum. 3. Left ventricular ejection fraction 20% 4. Non invasive risk stratification*: High *2012 Appropriate Use Criteria for Coronary Revascularization Focused Update: J Am Coll Cardiol. [id number]. http://content.[email or web address].aspx?articleid=1201161 Electronically Signed   By: Dorise Bullion III M.D   On: 01/17/2019 14:01    Cardiac Studies    Echo 01/13/19: IMPRESSIONS   1. Suboptimal image quality for diagnosis of biventricular function and wall motion.  2. Left ventricular ejection fraction, by visual estimation, is 50%. The left ventricle has limited views for evaluation of function. Possible apical wall motion abnormality. There is mildly increased left ventricular hypertrophy.  3. Global right ventricle was not well visualized.The right ventricular size is not well visualized. Right vetricular wall thickness was not assessed.  4. Left atrial size was not well visualized.  5. Right atrial size  was not well visualized.  6. Mild mitral annular calcification.  7. The mitral valve is abnormal. No evidence of mitral valve regurgitation. No evidence of mitral stenosis.  8. The tricuspid valve is not well visualized.  9. The aortic valve was not well visualized. Aortic valve regurgitation is not visualized. No evidence of aortic valve sclerosis or stenosis.  Patient Profile     Ms. 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  Cardiology consulted for elevated troponin.  Assessment & Plan    # Elevated troponin: peak 1023 no chest pain EF 50% with no discrete RWMAls previous CABG for cath this am  # Chronic systolic and diastolic heart failure: obese hard to tell volume status Check EDP at cath lasix post cath   # VT:  ICD shock appropriate R/O ischemia as cause of PMVT and long QT  # Hypertension: stable on beta blocker   # Hyperlipidemia: continue crestor   # Prior DVT: xarelto on hold continue heparin   # Pancreatitis:  Abdomin non acute per primary service not a candidate for ERCP per GI no surgery until cardiac issues clarified        For questions or updates, please contact [facility] Please consult [email or web address] for contact info under         Signed, Jenkins Rouge, MD  01/19/2019, 7:59 AM

## 2019-01-19 NOTE — H&P (View-Only) (Signed)
Progress Note  Patient Name: [name] Date of Encounter: 01/19/2019  Primary Cardiologist: Candee Furbish, MD   Subjective   Less abdominal pain going to [facility] for cath this am   Inpatient Medications    Scheduled Meds: . carvedilol  25 mg Oral BID WC  . Chlorhexidine Gluconate Cloth  6 each Topical Daily  . insulin aspart  0-5 Units Subcutaneous QHS  . insulin aspart  0-9 Units Subcutaneous TID WC  . insulin aspart  5 Units Subcutaneous TID WC  . insulin glargine  35 Units Subcutaneous Daily  . isosorbide mononitrate  30 mg Oral Daily  . latanoprost  1 drop Both Eyes QHS  . mouth rinse  15 mL Mouth Rinse BID  . sodium chloride flush  3 mL Intravenous Q12H   Continuous Infusions: . sodium chloride    . sodium chloride 1 mL/kg/hr (01/19/19 0453)  . heparin 1,700 Units/hr (01/19/19 0600)   PRN Meds: sodium chloride, dextrose, guaiFENesin-dextromethorphan, HYDROcodone-acetaminophen, ipratropium-albuterol, morphine injection, ondansetron (ZOFRAN) IV, sodium chloride flush   Vital Signs    Vitals:   01/19/19 0349 01/19/19 0604 01/19/19 0700 01/19/19 0719  BP: (!) 141/69   (!) 162/70  Pulse: 65 70 65 70  Resp: (!) 24 (!) 21 (!) 24 (!) 28  Temp:      TempSrc:      SpO2: 99% 98% 98% 96%  Weight:      Height:        Intake/Output Summary (Last 24 hours) at 01/19/2019 0759 Last data filed at 01/19/2019 0600 Gross per 24 hour  Intake 2221.54 ml  Output 900 ml  Net 1321.54 ml   Last 3 Weights 01/18/2019 01/12/2019 12/30/2018  Weight (lbs) 261 lb 11 oz 229 lb 229 lb  Weight (kg) 118.7 kg 103.874 kg 103.874 kg      Telemetry    NSR 01/19/2019   ECG    SR QT 582 prolonged lateral T wave inversions   Physical Exam   VS:  BP (!) 162/70   Pulse 70   Temp (!) 97 F (36.1 C) (Axillary)   Resp (!) 28   Ht 5\' 3"  (1.6 m)   Wt 118.7 kg   SpO2 96%   BMI 46.36 kg/m  , BMI Body mass index is 46.36 kg/m.  Obese black female  HEENT: normal Neck: no JVD Cardiac:  RRR; no murmurs distant AICD under left clavicle  Respiratory:  clear to auscultation bilaterally GI: soft, TTP  MS: no deformity or atrophy Skin: warm and dry, no rash Neuro:  Alert and Oriented x 3 Psych: normal affect     Labs    High Sensitivity Troponin:   Recent Labs  Lab 01/12/19 1857 01/13/19 0651 01/13/19 0849 01/13/19 1044 01/13/19 1356  TROPONINIHS 86* 688* 843* 1,023* 1,023*      Chemistry Recent Labs  Lab 01/15/19 0204 01/16/19 0214 01/17/19 0132 01/18/19 0123 01/19/19 0200  NA 134* 139 141 139 138  K 4.5 4.3 4.2 3.7 3.5  CL 104 109 110 108 108  CO2 20* 23 24 23 23   GLUCOSE 313* 211* 188* 99 152*  BUN 33* 30* 26* 17 16  CREATININE 1.58* 1.43* 1.35* 1.03* 1.05*  CALCIUM 8.1* 8.4* 8.6* 8.3* 8.1*  PROT 6.4* 6.3* 6.1*  --   --   ALBUMIN 2.7* 2.4* 2.4*  --   --   AST 71* 34 29  --   --   ALT 167* 114* 80*  --   --  ALKPHOS 109 95 123  --   --   BILITOT 1.6* 1.0 0.9  --   --   GFRNONAA 33* 38* 40* 56* 55*  GFRAA 39* 44* 47* >60 >60  ANIONGAP 10 7 7 8 7      Hematology Recent Labs  Lab 01/17/19 0132 01/18/19 0123 01/19/19 0200  WBC 11.1* 11.2* 10.1  RBC 3.30* 3.33* 3.24*  HGB 10.5* 10.7* 10.2*  HCT 33.6* 33.4* 32.5*  MCV 101.8* 100.3* 100.3*  MCH 31.8 32.1 31.5  MCHC 31.3 32.0 31.4  RDW 13.7 13.8 14.2  PLT 129* 130* 131*    BNP Recent Labs  Lab 01/12/19 1857 01/19/19 0200  BNP 88.3 533.7*     DDimer No results for input(s): DDIMER in the last 168 hours.   Radiology    NM Myocar Multi W/Spect W/Wall Motion / EF  Result Date: 01/17/2019 CLINICAL DATA:  Preoperative study prior to intermediate risk surgery. EXAM: MYOCARDIAL IMAGING WITH SPECT (REST AND PHARMACOLOGIC-STRESS) GATED LEFT VENTRICULAR WALL MOTION STUDY LEFT VENTRICULAR EJECTION FRACTION TECHNIQUE: Standard myocardial SPECT imaging was performed after resting intravenous injection of 10.23 mCi Tc-32m tetrofosmin. Subsequently, intravenous infusion of Lexiscan was performed  under the supervision of the Cardiology staff. At peak effect of the drug, 32.5 mCi Tc-95m tetrofosmin was injected intravenously and standard myocardial SPECT imaging was performed. Quantitative gated imaging was also performed to evaluate left ventricular wall motion, and estimate left ventricular ejection fraction. COMPARISON:  May 30, 2014 FINDINGS: Perfusion: There is a large primarily fixed defect in the septum, particularly from the mid wall to the apex which is worse when compared to 2016. There may be a small amount of reversibility in the anterior septum. There is a small fixed defect in the anterior wall towards the apex. There may be a small amount of reversibility in the anterior wall which is subtle. There is a large posterolateral wall defect which is stable. There is a large posterior wall defect which is stable. There is some preserved activity in the mid posterior wall. There is a large fixed defect at the apex. Wall Motion: Global hypokinesis. Dyskinesis in the septum. No thickening in the region of fixed defects. Left Ventricular Ejection Fraction: 123456 % End diastolic volume Q000111Q ml End systolic volume 0000000 ml IMPRESSION: 1. Fixed defects involve the septum, apex, anterior wall towards the apex, posterior wall, and posterolateral wall. The size of the fixed defects is worse in the septum in the interval. There may be a small amount of reversibility in the anterior septum as well as in the anterior wall. The possible mild reversibility a subtle. 2. Global hypokinesis. No thickening in the region of fixed defects. Dyskinesis of the septum. 3. Left ventricular ejection fraction 20% 4. Non invasive risk stratification*: High *2012 Appropriate Use Criteria for Coronary Revascularization Focused Update: J Am Coll Cardiol. [id number]. http://[email or web address].aspx?articleid=1201161 Electronically Signed   By: Dorise Bullion III M.D   On: 01/17/2019 14:01    Cardiac Studies    Echo 01/13/19: IMPRESSIONS   1. Suboptimal image quality for diagnosis of biventricular function and wall motion.  2. Left ventricular ejection fraction, by visual estimation, is 50%. The left ventricle has limited views for evaluation of function. Possible apical wall motion abnormality. There is mildly increased left ventricular hypertrophy.  3. Global right ventricle was not well visualized.The right ventricular size is not well visualized. Right vetricular wall thickness was not assessed.  4. Left atrial size was not well visualized.  5. Right atrial size  was not well visualized.  6. Mild mitral annular calcification.  7. The mitral valve is abnormal. No evidence of mitral valve regurgitation. No evidence of mitral stenosis.  8. The tricuspid valve is not well visualized.  9. The aortic valve was not well visualized. Aortic valve regurgitation is not visualized. No evidence of aortic valve sclerosis or stenosis.  Patient Profile     Ms. Knodle is a 56F with CAD s/p CABG in [id number], chronic systolic and diastolic heart failure LVEF 40 to 45%, VT status post ICD, diabetes, hypertension, OSA, morbid obesity, and OSA admitted with gallstone pancreatitis.  Cardiology consulted for elevated troponin.  Assessment & Plan    # Elevated troponin: peak 1023 no chest pain EF 50% with no discrete RWMAls previous CABG for cath this am  # Chronic systolic and diastolic heart failure: obese hard to tell volume status Check EDP at cath lasix post cath   # VT:  ICD shock appropriate R/O ischemia as cause of PMVT and long QT  # Hypertension: stable on beta blocker   # Hyperlipidemia: continue crestor   # Prior DVT: xarelto on hold continue heparin   # Pancreatitis:  Abdomin non acute per primary service not a candidate for ERCP per GI no surgery until cardiac issues clarified        For questions or updates, please contact [facility] Please consult [email or web address] for contact info under         Signed, Jenkins Rouge, MD  01/19/2019, 7:59 AM

## 2019-01-19 NOTE — Progress Notes (Signed)
PT Cancellation Note  Patient Details Name: [name] MRN: [id number] DOB: 1950/06/16   Cancelled Treatment:    Reason Eval/Treat Not Completed: Patient at procedure or test/unavailable - Pt going to [facility] for heart cath this day, PT to check back as schedule allows.  [place] Pager [phone]  Office [phone]    Roxine Caddy D Elonda Husky 01/19/2019, 8:57 AM

## 2019-01-19 NOTE — Progress Notes (Signed)
TR BAND REMOVAL  LOCATION:    Radial left radial  DEFLATED PER PROTOCOL:   yes  TIME BAND OFF / DRESSING APPLIED:    1730/gauze and tegaderm  SITE UPON ARRIVAL:    Level 0  SITE AFTER BAND REMOVAL:    Level 0  CIRCULATION SENSATION AND MOVEMENT:    Within Normal Limits :  Yes. Instructions reviewed w/patient.   COMMENTS:

## 2019-01-20 LAB — GLUCOSE, CAPILLARY
Glucose-Capillary: 180 mg/dL — ABNORMAL HIGH (ref 70–99)
Glucose-Capillary: 241 mg/dL — ABNORMAL HIGH (ref 70–99)
Glucose-Capillary: 244 mg/dL — ABNORMAL HIGH (ref 70–99)
Glucose-Capillary: 108 mg/dL — ABNORMAL HIGH (ref 70–99)

## 2019-01-20 NOTE — Progress Notes (Signed)
Occupational Therapy Treatment Patient Details Name: IRAM HEIMANN MRN: [id number] DOB: Apr 08, 1950 Today's Date: 01/20/2019    History of present illness 69year old female admitted for acute pancreatitis.  PMH:  CHF/ICM, home 02, CAD, CABG, ICD, DM, HTN, OSA, and DVT   OT comments  Pt used commode with nursing. Agreeable to standing and taking a few steps after she walked around room. Encouraged use of RW as she is much steadier with this.  Pt declined ADLs at this time.  Follow Up Recommendations  Home health OT    Equipment Recommendations    none, pt has a 3:1 commode   Recommendations for Other Services      Precautions / Restrictions Precautions Precautions: Fall Precaution Comments: chronic 2L O2 [place] Restrictions Weight Bearing Restrictions: No       Mobility Bed Mobility               General bed mobility comments: oob  Transfers   Equipment used: Rolling walker (2 wheeled)   Sit to Stand: Supervision         General transfer comment: no cues needed    Balance                                           ADL either performed or assessed with clinical judgement   ADL                                         General ADL Comments: pt had just gotten off BSC with RN. Walked around side of bed to chair with min hand held assist; pt had declined walker. She has a walker at home and was agreeable to standing and taking a few steps with this. She did not need cues and was safet than without.  Declined further ADL activities     Vision       Perception     Praxis      Cognition Arousal/Alertness: Awake/alert Behavior During Therapy: Flat affect Overall Cognitive Status: Within Functional Limits for tasks assessed                                          Exercises     Shoulder Instructions       General Comments vss    Pertinent Vitals/ Pain       Pain Assessment: No/denies pain  Home  Living                                          Prior Functioning/Environment              Frequency  Min 2X/week        Progress Toward Goals  OT Goals(current goals can now be found in the care plan section)  Progress towards OT goals: Progressing toward goals     Plan      Co-evaluation                 AM-PAC OT "6 Clicks" Daily Activity     Outcome Measure   Help from  another person eating meals?: None Help from another person taking care of personal grooming?: A Little Help from another person toileting, which includes using toliet, bedpan, or urinal?: Total Help from another person bathing (including washing, rinsing, drying)?: A Lot Help from another person to put on and taking off regular upper body clothing?: A Lot Help from another person to put on and taking off regular lower body clothing?: Total 6 Click Score: 13    End of Session    OT Visit Diagnosis: Unsteadiness on feet (R26.81);Muscle weakness (generalized) (M62.81)   Activity Tolerance Patient tolerated treatment well   Patient Left in chair;with call bell/phone within reach;with chair alarm set   Nurse Communication          Time: 1000-1022 OT Time Calculation (min): 22 min  Charges: OT General Charges $OT Visit: 1 Visit OT Treatments $Therapeutic Activity: 8-22 mins  Tavie Haseman S, OTR/L Acute Rehabilitation Services 01/20/2019   Ressie Slevin 01/20/2019, 11:41 AM

## 2019-01-20 NOTE — Progress Notes (Signed)
PROGRESS NOTE    [name]  [id number] DOB: [birth year] DOA: 01/12/2019 PCP: Everardo Beals, NP   Brief Narrative:  Patient is a 69 year old female with history of coronary artery disease status post CABG, chronic systolic heart failure status post AICD, history of V. tach, diabetes type 2, chronic kidney disease stage III, history of DVT on anticoagulation who presents to the emergency department with complaints of worsening abdominal pain.  She was complaining  of generalized abdominal pain mostly in the epigastric area with nausea and vomiting since last 3 weeks.  She was not able to tolerate food or medications .  Pain worsened over 24 to 48 hours.  On presentation she was afebrile.  She was found to have elevated liver enzymes, elevated lipase.  Elevated troponin.  EKG showed nonspecific ST changes.  Denied any chest pain.  CT abdomen/pelvis was concerning for acute pancreatitis. She  Had elevated anion gap, low CO2 concerning for DKA.  Patient was admitted for the management of pancreatitis, possible NSTEMI, DKA, UTI.  Cardiology and GI following.   She  had an episode of V. tach on 1/10 that required shock by ICD .She underwent cardiac cath on 1/11 which showed multivessel disease including total occlusion of diagonal, diffuse disease and ultimate total occlusion of the mid LAD, total occlusion of the proximal ramus branch, total occlusion of the mid circumflex, total occlusion of the right RCA. General surgery planning for cholecystectomy as an outpatient with follow-up in 3 months. Plan for transfer to telemetry floor today.  Planning for discharge in 24 to 48 hours after cardiology clearance.  01/20/19: This morning she looks comfortable.  Denies any chest pain, abdominal pain or shortness of breath.  Tolerating diet.  Had a bowel movement day before yesterday.  Assessment & Plan:   Principal Problem:   S/P CABG x 5 Active Problems:   Chronic combined systolic and diastolic  heart failure (HCC)   AKI (acute kidney injury) ([place])   DVT (deep venous thrombosis) (HCC)   Acute pancreatitis   DKA (diabetic ketoacidoses) (HCC)   Abnormal LFTs   Demand ischemia ([place])   Acute pancreatitis: Most likely gallstone pancreatitis.  Presented with epigastric abdominal pain.   CT abdomen/pelvis showed features of pancreatitis,gallstones.  Elevated liver enzymes, lipase and these are all improving .  Not a candidate for ERCP as per GI. cholecystectomy.  Abdominal pain has resolved.  She is tolerating diet.  General surgery planning for outpatient cholecystectomy.  Elevated troponin/multivessel coronary artery disease: Significant elevated troponin on presentation.  Denied any  chest pain.  Nonspecific changes in the EKG.  Echocardiogram  showed ejection fraction of 50%, possible apical wall motion abnormality.   She has history of coronary artery disease and is status post CABG. She  had an episode of V. tach on 1/10 that required shock by ICD .She underwent cardiac cath on 1/11 which showed multivessel disease including total occlusion of diagonal, diffuse disease and ultimate total occlusion of the mid LAD, total occlusion of the proximal ramus branch, total occlusion of the mid circumflex, total occlusion of the right RCA. Further management as per cardiology.  Suspected DKA: Low CO2, elevated anion gap on presentation .  History of diabetes type 2.  On insulin at home.  Off insulin drip now.  Started on Lantus and sliding scale.  Continue monitoring sugars.   Diabetic coordinator was  following.  Chronic combined  CHF: Currently euvolemic .  Has AICD.  Pacemaker interrogated without finding of  VT.  Echocardiogram as above . Cardiology following.  She is on carvedilol, Lasix, Imdur, lisinopril, statin, spironolactone at home .   Acute on CKD stage IIIa: Secondary to vomiting, poor oral intake.Resolved. Continue to monitor. Baseline creatinine around 1.2.  Kidney function at  baseline  Suspected UTI:  UA was suggestive of UTI.  Urine culture showed proteus .  Denies dysuria.  Abx changed to oral.  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  On Xarelto at home.   Lactic acidosis: Secondary to dehydration.  Lactate level normalised.  Low suspicion for infectious etiology.  Debility/deconditioning: PT evaluated her and recommended home health.           DVT prophylaxis: Xarelto Code Status: Full code Family Communication: None present at the bedside Disposition Plan: Home after cardiology clearance.   Consultants: GI, cardiology  Procedures: None  Antimicrobials:  Anti-infectives (From admission, onward)   Start     Dose/Rate Route Frequency Ordered Stop   01/15/19 1400  cephALEXin (KEFLEX) capsule 500 mg     500 mg Oral Every 12 hours 01/15/19 1357 01/17/19 2120   01/14/19 1400  cefTRIAXone (ROCEPHIN) 1 g in sodium chloride 0.9 % 100 mL IVPB  Status:  Discontinued     1 g 200 mL/hr over 30 Minutes Intravenous Every 24 hours 01/14/19 1206 01/15/19 1357   01/13/19 2200  cefTRIAXone (ROCEPHIN) 2 g in sodium chloride 0.9 % 100 mL IVPB  Status:  Discontinued     2 g 200 mL/hr over 30 Minutes Intravenous Every 24 hours 01/12/19 2338 01/13/19 1400   01/12/19 2345  metroNIDAZOLE (FLAGYL) IVPB 500 mg  Status:  Discontinued     500 mg 100 mL/hr over 60 Minutes Intravenous Every 8 hours 01/12/19 2338 01/13/19 1400   01/12/19 2300  cefTRIAXone (ROCEPHIN) 2 g in sodium chloride 0.9 % 100 mL IVPB     2 g 200 mL/hr over 30 Minutes Intravenous  Once 01/12/19 2247 01/13/19 0023   01/12/19 2245  cefTRIAXone (ROCEPHIN) 1 g in sodium chloride 0.9 % 100 mL IVPB  Status:  Discontinued     1 g 200 mL/hr over 30 Minutes Intravenous  Once 01/12/19 2242 01/12/19 2247       P Objective: Vitals:   01/20/19 0354 01/20/19 0400 01/20/19 0600 01/20/19 0833  BP:  (!) 140/52 (!) 109/38 132/66  Pulse:  (!) 57 (!) 53 82   Resp:  17 17   Temp: 97.7 F (36.5 C)     TempSrc: Axillary     SpO2:  95% 99%   Weight:      Height:        Intake/Output Summary (Last 24 hours) at 01/20/2019 0851 Last data filed at 01/20/2019 0650 Gross per 24 hour  Intake 164.1 ml  Output 750 ml  Net -585.9 ml   Filed Weights   01/12/19 1740 01/18/19 2106  Weight: 103.9 kg 118.7 kg    Examination:  General exam: Not in distress, morbidly obese HEENT:PERRL,Oral mucosa moist, Ear/Nose normal on gross exam Respiratory system: Bilateral equal air entry, normal vesicular breath sounds, no wheezes or crackles  Cardiovascular system:  RRR. No JVD, murmurs, rubs, gallops or clicks. No pedal edema. Gastrointestinal system: Abdomen is nondistended, soft and is nontender. No organomegaly or masses felt. Normal bowel sounds heard. Central nervous system: Alert and oriented. No focal neurological deficits. Extremities: No edema, no clubbing ,no cyanosis, distal peripheral pulses palpable. Skin: No rashes, lesions  or ulcers,no icterus ,no pallor .     Data Reviewed: I have personally reviewed following labs and imaging studies  CBC: Recent Labs  Lab 01/14/19 0140 01/15/19 0204 01/16/19 0214 01/17/19 0132 01/18/19 0123 01/19/19 0200  WBC 14.2* 11.7* 12.5* 11.1* 11.2* 10.1  NEUTROABS 11.6*  --   --   --   --   --   HGB 13.7 12.3 11.3* 10.5* 10.7* 10.2*  HCT 43.2 39.3 35.0* 33.6* 33.4* 32.5*  MCV 101.6* 101.6* 100.0 101.8* 100.3* 100.3*  PLT 146* 120* 117* 129* 130* [id number]*   Basic Metabolic Panel: Recent Labs  Lab 01/15/19 0204 01/16/19 0214 01/17/19 0132 01/18/19 0123 01/19/19 0200  NA 134* 139 141 139 138  K 4.5 4.3 4.2 3.7 3.5  CL 104 109 110 108 108  CO2 20* 23 24 23 23   GLUCOSE 313* 211* 188* 99 152*  BUN 33* 30* 26* 17 16  CREATININE 1.58* 1.43* 1.35* 1.03* 1.05*  CALCIUM 8.1* 8.4* 8.6* 8.3* 8.1*  MG  --   --  2.4  --  2.3  PHOS  --   --  1.7* 1.6* 2.9   GFR: Estimated Creatinine Clearance: 63.9 mL/min  (A) (by C-G formula based on SCr of 1.05 mg/dL (H)). Liver Function Tests: Recent Labs  Lab 01/14/19 0140 01/15/19 0204 01/16/19 0214 01/17/19 0132  AST 153* 71* 34 29  ALT 265* 167* 114* 80*  ALKPHOS 131* 109 95 123  BILITOT 2.5* 1.6* 1.0 0.9  PROT 6.8 6.4* 6.3* 6.1*  ALBUMIN 3.0* 2.7* 2.4* 2.4*   Recent Labs  Lab 01/15/19 0204 01/17/19 0132 01/18/19 0123  LIPASE 359* 42 48   No results for input(s): AMMONIA in the last 168 hours. Coagulation Profile: No results for input(s): INR, PROTIME in the last 168 hours. Cardiac Enzymes: No results for input(s): CKTOTAL, CKMB, CKMBINDEX, TROPONINI in the last 168 hours. BNP (last 3 results) No results for input(s): PROBNP in the last 8760 hours. HbA1C: No results for input(s): HGBA1C in the last 72 hours. CBG: Recent Labs  Lab 01/19/19 0747 01/19/19 1240 01/19/19 1356 01/19/19 2123 01/20/19 0752  GLUCAP 134* 85 85 185* 180*   Lipid Profile: No results for input(s): CHOL, HDL, LDLCALC, TRIG, CHOLHDL, LDLDIRECT in the last 72 hours. Thyroid Function Tests: No results for input(s): TSH, T4TOTAL, FREET4, T3FREE, THYROIDAB in the last 72 hours. Anemia Panel: No results for input(s): VITAMINB12, FOLATE, FERRITIN, TIBC, IRON, RETICCTPCT in the last 72 hours. Sepsis Labs: Recent Labs  Lab 01/14/19 0140 01/15/19 0204  LATICACIDVEN 2.5* 1.9    Recent Results (from the past 240 hour(s))  SARS CORONAVIRUS 2 (TAT 6-24 HRS) Nasopharyngeal Nasopharyngeal Swab     Status: None   Collection Time: 01/12/19  8:45 PM   Specimen: Nasopharyngeal Swab  Result Value [place] Status   SARS Coronavirus 2 NEGATIVE NEGATIVE Final    Comment: (NOTE) SARS-CoV-2 target nucleic acids are NOT DETECTED. The SARS-CoV-2 RNA is generally detectable in upper and lower respiratory specimens during the acute phase of infection. Negative results do not preclude SARS-CoV-2 infection, do not rule out co-infections with other pathogens, and should not  be used as the sole basis for treatment or other patient management decisions. Negative results must be combined with clinical observations, patient history, and epidemiological information. The expected result is Negative. Fact Sheet for Patients: SugarRoll.be Fact Sheet for Healthcare Providers: https://www.woods-mathews.com/ This test is not yet approved or cleared by the [place] and  has been authorized  for detection and/or diagnosis of SARS-CoV-2 by FDA under an Emergency Use Authorization (EUA). This EUA will remain  in effect (meaning this test can be used) for the duration of the COVID-19 declaration under Section 56 4(b)(1) of the Act, 21 U.S.C. section 360bbb-3(b)(1), unless the authorization is terminated or revoked sooner. Performed at [facility], [place] [address], [place], [place] [address]   Urine culture     Status: Abnormal   Collection Time: 01/12/19 10:43 PM   Specimen: Urine, Clean Catch  Result Value [place] Status   Specimen Description   Final    URINE, CLEAN CATCH Performed at [facility], [place] [address] [place], [place] [address]    Special Requests   Final    NONE Performed at [facility], [place] [address] [place], [place] [address]    Culture >=100,000 COLONIES/mL PROTEUS MIRABILIS (A)  Final   Report Status 01/15/2019 FINAL  Final   Organism ID, Bacteria PROTEUS MIRABILIS (A)  Final      Susceptibility   Proteus mirabilis - MIC*    AMPICILLIN <=2 SENSITIVE Sensitive     CEFAZOLIN <=4 SENSITIVE Sensitive     CEFTRIAXONE <=0.25 SENSITIVE Sensitive     CIPROFLOXACIN <=0.25 SENSITIVE Sensitive     GENTAMICIN <=1 SENSITIVE Sensitive     IMIPENEM 2 SENSITIVE Sensitive     NITROFURANTOIN 128 RESISTANT Resistant     TRIMETH/SULFA <=20 SENSITIVE Sensitive     AMPICILLIN/SULBACTAM <=2 SENSITIVE Sensitive     PIP/TAZO <=4 SENSITIVE Sensitive     * >=100,000  COLONIES/mL PROTEUS MIRABILIS  MRSA PCR Screening     Status: None   Collection Time: 01/13/19  5:43 PM   Specimen: Nasopharyngeal  Result Value [place] Status   MRSA by PCR NEGATIVE NEGATIVE Final    Comment:        The GeneXpert MRSA Assay (FDA approved for NASAL specimens only), is one component of a comprehensive MRSA colonization surveillance program. It is not intended to diagnose MRSA infection nor to guide or monitor treatment for MRSA infections. Performed at [facility], [place] [address] [place], [place] [address]          Radiology Studies: CARDIAC CATHETERIZATION  Result Date: 01/19/2019  Severe diffuse three-vessel coronary disease with functional total occlusion of the mid LAD, total occlusion of the large first diagonal, total occlusion of the proximal ramus intermedius, total occlusion of the proximal circumflex, and total occlusion of the mid RCA.  All vessels are heavily calcified.  Patent saphenous vein graft to the PDA.  Moderate diffuse disease beyond the graft insertion site  Patent sequential saphenous vein graft to the first and second obtuse marginal.  Diffuse native vessel disease beyond the bypass graft insertion site.  Patent saphenous vein graft to the ramus intermedius.  Diffuse native vessel disease beyond the graft insertion site.  Patent left internal mammary graft to the dominant diagonal.  Diffuse native vessel disease beyond the graft insertion site.  Chronic systolic heart failure with EF less than 35% and LVEDP 22 mmHg. RECOMMENDATIONS:  Guideline directed therapy for systolic heart failure.       Scheduled Meds: . carvedilol  25 mg Oral BID WC  . Chlorhexidine Gluconate Cloth  6 each Topical Daily  . insulin aspart  0-5 Units Subcutaneous QHS  . insulin aspart  0-9 Units Subcutaneous TID WC  . insulin aspart  5 Units Subcutaneous TID WC  . insulin glargine  35 Units Subcutaneous Daily  . isosorbide mononitrate   30 mg Oral Daily  . latanoprost  1 drop Both Eyes QHS  . mouth rinse  15 mL Mouth Rinse BID  . rivaroxaban  20 mg Oral Q supper  . sodium chloride flush  3 mL Intravenous Q12H   Continuous Infusions: . sodium chloride       LOS: 8 days    Time spent:35 mins, More than 50% of that time was spent in counseling and/or coordination of care.      Shelly Coss, MD [facility] Pager [phone]  If 7PM-7AM, please contact night-coverage [email or web address] Password [facility] 01/20/2019, 8:51 AM

## 2019-01-20 NOTE — Final Consult Note (Signed)
Consultant Final Sign-Off Note    Assessment/Final recommendations  [name] is a 69 y.o. female followed by the general surgery team for biliary pancreatitis and cholelithiasis.   Patient had an episode of V-Tach 1/10 that required shock x 1 by ICD. She underwent left heart cath 1/11 that showed "Total occlusion of the large diagonal, diffuse disease and ultimate total occlusion of the mid LAD, total occlusion of the proximal ramus branch, total occlusion of the mid circumflex, and total occlusion of the mid RCA.  Left main is patent. [...] Global hypokinesis difficult to specifically estimate EF but appears to be less than 35%" It appears she has diffuse disease beyond graft site for distal RCA, Ramus, and obtuse marginal 1 and 2 per cardiologies notes.   Patient is currently on Xarelto and a Regular diet. She is tolerating her diet without increased abdominal pain or any n/v. She reports that her epigastric pain is minimal at times and has none currently. Her WBC, Lipase and LFTs have down trended and normalized. Given her Cardiology issues, will have patient follow up in 3 months in clinic to discuss possible elective cholecystectomy after. I have discussed this with her. She states understanding of this.   Wound care (if applicable):    Diet at discharge: Low fat diet   Activity at discharge: per primary team   Follow-up appointment: [name]   Pending results:  Unresulted Labs (From admission, onward)    Start     Ordered   01/20/19 0500  Comprehensive metabolic panel  Tomorrow morning,   R    Question:  Specimen collection method  Answer:  Lab=Lab collect   01/19/19 1755   01/20/19 0500  Magnesium  Tomorrow morning,   R    Question:  Specimen collection method  Answer:  Lab=Lab collect   01/19/19 1755   01/19/19 1709  CBC  (heparin)  Once,   R    Comments: Baseline for heparin therapy IF NOT ALREADY DRAWN.  Notify MD if PLT < 100 K.   Question:  Specimen collection method   Answer:  Lab=Lab collect   01/19/19 1708   01/19/19 1709  Creatinine, serum  (heparin)  Once,   R    Comments: Baseline for heparin therapy IF NOT ALREADY DRAWN.   Question:  Specimen collection method  Answer:  Lab=Lab collect   01/19/19 1708   01/16/19 0500  CBC  Daily,   R    Question:  Specimen collection method  Answer:  Lab=Lab collect   01/15/19 2100           Medication recommendations:   Other recommendations:    Thank you for allowing [name] to participate in the care of your patient!  Please consult [name] again if you have further needs for your patient.  [name] 01/20/2019 7:51 AM    Subjective   Patient reports that she has some sob this am. No abdominal pain, n/v. She is tolerating her regular diet without abdominal pain.   Objective  Vital signs in last 24 hours: Temp:  [97.7 F (36.5 C)-98.5 F (36.9 C)] 97.7 F (36.5 C) (01/12 0354) Pulse Rate:  [0-288] 53 (01/12 0600) Resp:  [0-42] 17 (01/12 0600) BP: (94-163)/(16-76) 109/38 (01/12 0600) SpO2:  [0 %-100 %] 99 % (01/12 0600)  Gen:  Alert, NAD, pleasant Pulm: Tachypnea  Abd: Soft, protuberant, mild epigastric tenderness without r/r/g. No peritonitis. +BS Skin: no rashes noted, warm and dry  Pertinent labs and  Studies: Recent Labs    01/18/19 0123 01/19/19 0200  WBC 11.2* 10.1  HGB 10.7* 10.2*  HCT 33.4* 32.5*   BMET Recent Labs    01/18/19 0123 01/19/19 0200  NA 139 138  K 3.7 3.5  CL 108 108  CO2 23 23  GLUCOSE 99 152*  BUN 17 16  CREATININE 1.03* 1.05*  CALCIUM 8.3* 8.1*   No results for input(s): LABURIN in the last 72 hours. Results for orders placed or performed during the hospital encounter of 01/12/19  SARS CORONAVIRUS 2 (TAT 6-24 HRS) Nasopharyngeal Nasopharyngeal Swab     Status: None   Collection Time: 01/12/19  8:45 PM   Specimen: Nasopharyngeal Swab  Result Value [place] Status   SARS Coronavirus 2 NEGATIVE NEGATIVE Final    Comment: (NOTE) SARS-CoV-2  target nucleic acids are NOT DETECTED. The SARS-CoV-2 RNA is generally detectable in upper and lower respiratory specimens during the acute phase of infection. Negative results do not preclude SARS-CoV-2 infection, do not rule out co-infections with other pathogens, and should not be used as the sole basis for treatment or other patient management decisions. Negative results must be combined with clinical observations, patient history, and epidemiological information. The expected result is Negative. Fact Sheet for Patients: SugarRoll.be Fact Sheet for Healthcare Providers: https://www.woods-mathews.com/ This test is not yet approved or cleared by the [place] FDA and  has been authorized for detection and/or diagnosis of SARS-CoV-2 by FDA under an Emergency Use Authorization (EUA). This EUA will remain  in effect (meaning this test can be used) for the duration of the COVID-19 declaration under Section 56 4(b)(1) of the Act, 21 U.S.C. section 360bbb-3(b)(1), unless the authorization is terminated or revoked sooner. Performed at [facility], [place] [address] [place], [place] [address]   Urine culture     Status: Abnormal   Collection Time: 01/12/19 10:43 PM   Specimen: Urine, Clean Catch  Result Value Ref [place] Status   Specimen Description   Final    URINE, CLEAN CATCH Performed at [facility], [place] [address], [place], [place] [address]    Special Requests   Final    NONE Performed at [facility], [place] [address], [place], [place] [address]    Culture >=100,000 COLONIES/mL PROTEUS MIRABILIS (A)  Final   Report Status 01/15/2019 FINAL  Final   Organism ID, Bacteria PROTEUS MIRABILIS (A)  Final      Susceptibility   Proteus mirabilis - MIC*    AMPICILLIN <=2 SENSITIVE Sensitive     CEFAZOLIN <=4 SENSITIVE Sensitive     CEFTRIAXONE <=0.25 SENSITIVE Sensitive     CIPROFLOXACIN <=0.25  SENSITIVE Sensitive     GENTAMICIN <=1 SENSITIVE Sensitive     IMIPENEM 2 SENSITIVE Sensitive     NITROFURANTOIN 128 RESISTANT Resistant     TRIMETH/SULFA <=20 SENSITIVE Sensitive     AMPICILLIN/SULBACTAM <=2 SENSITIVE Sensitive     PIP/TAZO <=4 SENSITIVE Sensitive     * >=100,000 COLONIES/mL PROTEUS MIRABILIS  MRSA PCR Screening     Status: None   Collection Time: 01/13/19  5:43 PM   Specimen: Nasopharyngeal  Result Value Ref [place] Status   MRSA by PCR NEGATIVE NEGATIVE Final    Comment:        The GeneXpert MRSA Assay (FDA approved for NASAL specimens only), is one component of a comprehensive MRSA colonization surveillance program. It is not intended to diagnose MRSA infection nor to guide or monitor treatment for  MRSA infections. Performed at [facility], [place] [address], [place], [place] [address]     Imaging: CARDIAC CATHETERIZATION  Result Date: 01/19/2019  Severe diffuse three-vessel coronary disease with functional total occlusion of the mid LAD, total occlusion of the large first diagonal, total occlusion of the proximal ramus intermedius, total occlusion of the proximal circumflex, and total occlusion of the mid RCA.  All vessels are heavily calcified.  Patent saphenous vein graft to the PDA.  Moderate diffuse disease beyond the graft insertion site  Patent sequential saphenous vein graft to the first and second obtuse marginal.  Diffuse native vessel disease beyond the bypass graft insertion site.  Patent saphenous vein graft to the ramus intermedius.  Diffuse native vessel disease beyond the graft insertion site.  Patent left internal mammary graft to the dominant diagonal.  Diffuse native vessel disease beyond the graft insertion site.  Chronic systolic heart failure with EF less than 35% and LVEDP 22 mmHg. RECOMMENDATIONS:  Guideline directed therapy for systolic heart failure.

## 2019-01-20 NOTE — Progress Notes (Addendum)
Progress Note  Patient Name: [name] Date of Encounter: 01/20/2019  Primary Cardiologist: Candee Furbish, MD   Subjective   Sitting in chair comfortable Feels like her legs are still swollen   Inpatient Medications    Scheduled Meds: . carvedilol  25 mg Oral BID WC  . Chlorhexidine Gluconate Cloth  6 each Topical Daily  . insulin aspart  0-5 Units Subcutaneous QHS  . insulin aspart  0-9 Units Subcutaneous TID WC  . insulin aspart  5 Units Subcutaneous TID WC  . insulin glargine  35 Units Subcutaneous Daily  . isosorbide mononitrate  30 mg Oral Daily  . latanoprost  1 drop Both Eyes QHS  . mouth rinse  15 mL Mouth Rinse BID  . rivaroxaban  20 mg Oral Q supper  . sodium chloride flush  3 mL Intravenous Q12H   Continuous Infusions: . sodium chloride     PRN Meds: sodium chloride, acetaminophen, dextrose, guaiFENesin-dextromethorphan, HYDROcodone-acetaminophen, ipratropium-albuterol, morphine injection, ondansetron (ZOFRAN) IV, ondansetron (ZOFRAN) IV, oxyCODONE, sodium chloride flush   Vital Signs    Vitals:   01/20/19 0400 01/20/19 0600 01/20/19 0800 01/20/19 0833  BP: (!) 140/52 (!) 109/38  132/66  Pulse: (!) 57 (!) 53  82  Resp: 17 17    Temp:   97.7 F (36.5 C)   TempSrc:   Oral   SpO2: 95% 99%    Weight:      Height:        Intake/Output Summary (Last 24 hours) at 01/20/2019 0955 Last data filed at 01/20/2019 0650 Gross per 24 hour  Intake 164.1 ml  Output 750 ml  Net -585.9 ml   Last 3 Weights 01/18/2019 01/12/2019 12/30/2018  Weight (lbs) 261 lb 11 oz 229 lb 229 lb  Weight (kg) 118.7 kg 103.874 kg 103.874 kg      Telemetry    NSR 01/20/2019   ECG    SR QT 582 prolonged lateral T wave inversions   Physical Exam   VS:  BP 132/66   Pulse 82   Temp 97.7 F (36.5 C) (Oral)   Resp 17   Ht 5\' 3"  (1.6 m)   Wt 118.7 kg   SpO2 99%   BMI 46.36 kg/m  , BMI Body mass index is 46.36 kg/m.  Obese black female  HEENT: normal Neck: no  JVD Cardiac: RRR; no murmurs distant AICD under left clavicle  Respiratory:  clear to auscultation bilaterally GI: soft, TTP  MS: no deformity or atrophy Skin: warm and dry, no rash Neuro:  Alert and Oriented x 3 Psych: normal affect Left radial cath site A     Labs    High Sensitivity Troponin:   Recent Labs  Lab 01/12/19 1857 01/13/19 0651 01/13/19 0849 01/13/19 1044 01/13/19 1356  TROPONINIHS 86* 688* 843* 1,023* 1,023*      Chemistry Recent Labs  Lab 01/15/19 0204 01/16/19 0214 01/17/19 0132 01/18/19 0123 01/19/19 0200  NA 134* 139 141 139 138  K 4.5 4.3 4.2 3.7 3.5  CL 104 109 110 108 108  CO2 20* 23 24 23 23   GLUCOSE 313* 211* 188* 99 152*  BUN 33* 30* 26* 17 16  CREATININE 1.58* 1.43* 1.35* 1.03* 1.05*  CALCIUM 8.1* 8.4* 8.6* 8.3* 8.1*  PROT 6.4* 6.3* 6.1*  --   --   ALBUMIN 2.7* 2.4* 2.4*  --   --   AST 71* 34 29  --   --   ALT 167* 114* 80*  --   --  ALKPHOS 109 95 123  --   --   BILITOT 1.6* 1.0 0.9  --   --   GFRNONAA 33* 38* 40* 56* 55*  GFRAA 39* 44* 47* >60 >60  ANIONGAP 10 7 7 8 7      Hematology Recent Labs  Lab 01/17/19 0132 01/18/19 0123 01/19/19 0200  WBC 11.1* 11.2* 10.1  RBC 3.30* 3.33* 3.24*  HGB 10.5* 10.7* 10.2*  HCT 33.6* 33.4* 32.5*  MCV 101.8* 100.3* 100.3*  MCH 31.8 32.1 31.5  MCHC 31.3 32.0 31.4  RDW 13.7 13.8 14.2  PLT 129* 130* 131*    BNP Recent Labs  Lab 01/19/19 0200  BNP 533.7*     DDimer No results for input(s): DDIMER in the last 168 hours.   Radiology    CARDIAC CATHETERIZATION  Result Date: 01/19/2019  Severe diffuse three-vessel coronary disease with functional total occlusion of the mid LAD, total occlusion of the large first diagonal, total occlusion of the proximal ramus intermedius, total occlusion of the proximal circumflex, and total occlusion of the mid RCA.  All vessels are heavily calcified.  Patent saphenous vein graft to the PDA.  Moderate diffuse disease beyond the graft insertion  site  Patent sequential saphenous vein graft to the first and second obtuse marginal.  Diffuse native vessel disease beyond the bypass graft insertion site.  Patent saphenous vein graft to the ramus intermedius.  Diffuse native vessel disease beyond the graft insertion site.  Patent left internal mammary graft to the dominant diagonal.  Diffuse native vessel disease beyond the graft insertion site.  Chronic systolic heart failure with EF less than 35% and LVEDP 22 mmHg. RECOMMENDATIONS:  Guideline directed therapy for systolic heart failure.   Cardiac Studies   Echo 01/13/19: IMPRESSIONS   1. Suboptimal image quality for diagnosis of biventricular function and wall motion.  2. Left ventricular ejection fraction, by visual estimation, is 50%. The left ventricle has limited views for evaluation of function. Possible apical wall motion abnormality. There is mildly increased left ventricular hypertrophy.  3. Global right ventricle was not well visualized.The right ventricular size is not well visualized. Right vetricular wall thickness was not assessed.  4. Left atrial size was not well visualized.  5. Right atrial size was not well visualized.  6. Mild mitral annular calcification.  7. The mitral valve is abnormal. No evidence of mitral valve regurgitation. No evidence of mitral stenosis.  8. The tricuspid valve is not well visualized.  9. The aortic valve was not well visualized. Aortic valve regurgitation is not visualized. No evidence of aortic valve sclerosis or stenosis.  Patient Profile     [name] is a 73F with CAD s/p CABG in [id number], chronic systolic and diastolic heart failure LVEF 40 to 45%, VT status post ICD, diabetes, hypertension, OSA, morbid obesity, and OSA admitted with gallstone pancreatitis.  Cardiology consulted for elevated troponin.  Assessment & Plan    # Elevated troponin: peak 1023 no chest pain EF 50% with no discrete RWMAls previous CABG. Cath 01/18/18 With patent  grafts no intervention needed   # Chronic systolic and diastolic heart failure: obese hard to tell volume status LE edema is improved with some Tenting in skin EDP reasonable at cath 22 mmHg    # VT:  ICD shock appropriate Grafts patient will repeat ECG in am to assess QT  # Hypertension: stable on beta blocker   # Hyperlipidemia: continue crestor   # Prior DVT: xarelto on hold continue heparin   #  Pancreatitis:  Abdomin non acute per primary service not a candidate for ERCP per GI no surgery Cardiac anatomy and volume currently stable    No further cardiac w/u planned continue medical Rx for CHF     For questions or updates, please contact [place] Please consult [email or web address] for contact info under        Signed, Jenkins Rouge, MD  01/20/2019, 9:55 AM

## 2019-01-20 NOTE — Progress Notes (Signed)
Physical Therapy Treatment Patient Details Name: [name] MRN: [id number] DOB: May 07, 1950 Today's Date: 01/20/2019    History of Present Illness 69year old female admitted for acute pancreatitis.  PMH:  CHF/ICM, home 02, CAD, CABG, ICD, DM, HTN, OSA, and DVT    PT Comments    Pt stated she felt fatigued from already being OOB today, but agreed to gait training and return to bed with PT. Pt ambulated room distance, ~20 ft, with RW and min guard assist. Pt still requiring min-mod assist for bed mobility and transfers, pt limited by weakness. Pt tolerated limited LE exercises at EOB. PT to continue to progress pt mobility as tolerated.   SpO2 80-100%, 3-4LO2 (80% reading post-ambulation, recovered with rest and breathing technique)    Follow Up Recommendations  Home health PT;Supervision/Assistance - 24 hour     Equipment Recommendations  None recommended by PT    Recommendations for Other Services       Precautions / Restrictions Precautions Precautions: Fall Precaution Comments: chronic 2L O2 [place] Restrictions Weight Bearing Restrictions: No    Mobility  Bed Mobility Overal bed mobility: Needs Assistance Bed Mobility: Sit to Supine           General bed mobility comments: OOB on BSC upon PT arrival to room, mod assist for sit to supine for LE lifting, scooting up in bed with assist of chuck pads. PT encouraged pt to participate in scooting up in bed with pushing through LEs  Transfers Overall transfer level: Needs assistance Equipment used: Rolling walker (2 wheeled) Transfers: Sit to/from [facility] Sit to Stand: Min assist;Min guard Stand pivot transfers: Min guard       General transfer comment: Min assist for initial power up, stand pivot x1 from [facility] to recliner to recover after toileting. Sit to stand x2, VC for hand placement when rising/sitting.  Ambulation/Gait Ambulation/Gait assistance: Min guard Gait Distance (Feet): 20  Feet Assistive device: Rolling walker (2 wheeled) Gait Pattern/deviations: Step-through pattern;Decreased stride length Gait velocity: decr   General Gait Details: min guard for safety, verbal cuing for placement in RW and upright posture. Pt with SpO2 reading of 80% after ambulation with DOE 3/4, PT increased O2 to 4L for recovery with encouragement of breathing techique in through nose, out through mouth.   Stairs             Wheelchair Mobility    Modified Rankin (Stroke Patients Only)       Balance Overall balance assessment: Needs assistance Sitting-balance support: No upper extremity supported;Feet supported Sitting balance-Leahy Scale: Fair     Standing balance support: Bilateral upper extremity supported Standing balance-Leahy Scale: Poor Standing balance comment: reliant on external support                            Cognition Arousal/Alertness: Awake/alert Behavior During Therapy: Flat affect Overall Cognitive Status: Within Functional Limits for tasks assessed                                        Exercises General Exercises - Lower Extremity Long Arc Quad: AROM;Both;10 reps;Seated    General Comments        Pertinent Vitals/Pain Pain Assessment: Faces Faces Pain Scale: Hurts a little bit Pain Location: abdomen (reports she needs to have a BM) Pain Descriptors / Indicators: Discomfort;Sore Pain Intervention(s): Limited  activity within patient's tolerance;Monitored during session;Repositioned    Home Living                      Prior Function            PT Goals (current goals can now be found in the care plan section) Acute Rehab PT Goals PT Goal Formulation: With patient Time For Goal Achievement: 01/29/19 Potential to Achieve Goals: Good Progress towards PT goals: Progressing toward goals    Frequency    Min 3X/week      PT Plan Current plan remains appropriate    Co-evaluation               AM-PAC PT "6 Clicks" Mobility   Outcome Measure  Help needed turning from your back to your side while in a flat bed without using bedrails?: A Little Help needed moving from lying on your back to sitting on the side of a flat bed without using bedrails?: A Little Help needed moving to and from a bed to a chair (including a wheelchair)?: A Little Help needed standing up from a chair using your arms (Tran.g., wheelchair or bedside chair)?: A Little Help needed to walk in hospital room?: A Little Help needed climbing 3-5 steps with a railing? : A Lot 6 Click Score: 17    End of Session Equipment Utilized During Treatment: Gait belt Activity Tolerance: Patient limited by fatigue Patient left: in bed;with call bell/phone within reach;with bed alarm set Nurse Communication: Mobility status PT Visit Diagnosis: Other abnormalities of gait and mobility (R26.89)     Time: [id number] PT Time Calculation (min) (ACUTE ONLY): 32 min  Charges:  $Gait Training: 8-22 mins $Therapeutic Activity: 8-22 mins                     [name], PT Acute Rehabilitation Services Pager [phone]  Office [phone]  [name] [name] 01/20/2019, 3:40 PM

## 2019-01-20 NOTE — Discharge Instructions (Signed)
Gallbladder Eating Plan If you have a gallbladder condition, you may have trouble digesting fats. Eating a low-fat diet can help reduce your symptoms, and may be helpful before and after having surgery to remove your gallbladder (cholecystectomy). Your health care provider may recommend that you work with a diet and nutrition specialist (dietitian) to help you reduce the amount of fat in your diet. What are tips for following this plan? General guidelines  Limit your fat intake to less than 30% of your total daily calories. If you eat around 1,800 calories each day, this is less than 60 grams (g) of fat per day.  Fat is an important part of a healthy diet. Eating a low-fat diet can make it hard to maintain a healthy body weight. Ask your dietitian how much fat, calories, and other nutrients you need each day.  Eat small, frequent meals throughout the day instead of three large meals.  Drink at least 8-10 cups of fluid a day. Drink enough fluid to keep your urine clear or pale yellow.  Limit alcohol intake to no more than 1 drink a day for nonpregnant women and 2 drinks a day for men. One drink equals 12 oz of beer, 5 oz of wine, or 1 oz of hard liquor. Reading food labels  Check Nutrition Facts on food labels for the amount of fat per serving. Choose foods with less than 3 grams of fat per serving. Shopping  Choose nonfat and low-fat healthy foods. Look for the words "nonfat," "low fat," or "fat free."  Avoid buying processed or prepackaged foods. Cooking  Cook using low-fat methods, such as baking, broiling, grilling, or boiling.  Cook with small amounts of healthy fats, such as olive oil, grapeseed oil, canola oil, or sunflower oil. What foods are recommended?   All fresh, frozen, or canned fruits and vegetables.  Whole grains.  Low-fat or non-fat (skim) milk and yogurt.  Lean meat, skinless poultry, fish, eggs, and beans.  Low-fat protein supplement powders or  drinks.  Spices and herbs. What foods are not recommended?  High-fat foods. These include baked goods, fast food, fatty cuts of meat, ice cream, french toast, sweet rolls, pizza, cheese bread, foods covered with butter, creamy sauces, or cheese.  Fried foods. These include french fries, tempura, battered fish, breaded chicken, fried breads, and sweets.  Foods with strong odors.  Foods that cause bloating and gas. Summary  A low-fat diet can be helpful if you have a gallbladder condition, or before and after gallbladder surgery.  Limit your fat intake to less than 30% of your total daily calories. This is about 60 g of fat if you eat 1,800 calories each day.  Eat small, frequent meals throughout the day instead of three large meals. This information is not intended to replace advice given to you by your health care provider. Make sure you discuss any questions you have with your health care provider. Document Revised: 04/17/2018 Document Reviewed: 02/02/2016 [employer]vier Patient Education  2020 [employer]  

## 2019-01-21 LAB — BASIC METABOLIC PANEL
Anion gap: 7 (ref 5–15)
BUN: 12 mg/dL (ref 8–23)
CO2: 24 mmol/L (ref 22–32)
Calcium: 8.4 mg/dL — ABNORMAL LOW (ref 8.9–10.3)
Chloride: 105 mmol/L (ref 98–111)
Creatinine, Ser: 1.08 mg/dL — ABNORMAL HIGH (ref 0.44–1.00)
GFR calc Af Amer: >60 mL/min (ref >60)
GFR calc non Af Amer: 53 mL/min — ABNORMAL LOW (ref >60)
Glucose, Bld: 114 mg/dL — ABNORMAL HIGH (ref 70–99)
Potassium: 3.8 mmol/L (ref 3.5–5.1)
Sodium: 136 mmol/L (ref 135–145)

## 2019-01-21 LAB — GLUCOSE, CAPILLARY
Glucose-Capillary: 117 mg/dL — ABNORMAL HIGH (ref 70–99)
Glucose-Capillary: 118 mg/dL — ABNORMAL HIGH (ref 70–99)

## 2019-01-21 MED ORDER — FUROSEMIDE 80 MG PO TABS: 40.0000 mg | ORAL_TABLET | Freq: Every day | ORAL | 1 refills | Status: DC

## 2019-01-21 NOTE — Discharge Summary (Signed)
Physician Discharge Summary  [name] [id number] DOB: [birth year] DOA: 01/12/2019  PCP: Everardo Beals, NP  Admit date: 01/12/2019 Discharge date: 01/21/2019  Admitted From: Home Disposition:  Home  Discharge Condition:Stable CODE STATUS:FULL Diet recommendation: Carbohydrate consistent  Brief/Interim Summary:  Patient is a 69 year old female with history of coronary artery disease status post CABG, chronic systolic heart failure status post AICD, history of V. tach, diabetes type 2, chronic kidney disease stage III, history of DVT on anticoagulation who presents to the emergency department with complaints of worsening abdominal pain.  She was complaining  of generalized abdominal pain mostly in the epigastric area with nausea and vomiting since last 3 weeks.  She was not able to tolerate food or medications .  Pain worsened over 24 to 48 hours.  On presentation she was afebrile.  She was found to have elevated liver enzymes, elevated lipase.  Elevated troponin.  EKG showed nonspecific ST changes.  Denied any chest pain.  CT abdomen/pelvis was concerning for acute pancreatitis. She  Had elevated anion gap, low CO2 concerning for DKA.  Patient was admitted for the management of pancreatitis, possible NSTEMI, DKA, UTI.  Cardiology and GI following.   She  had an episode of V. tach on 1/10 that required shock by ICD .She underwent cardiac cath on 1/11 which showed multivessel disease including total occlusion of diagonal, diffuse disease and ultimate total occlusion of the mid LAD, total occlusion of the proximal ramus branch, total occlusion of the mid circumflex, total occlusion of the right RCA. Her abdominal pain has completely resolved and she is tolerating diet.  General surgery planning for cholecystectomy as an outpatient with follow-up in 3 months.  Cardiology cleared her for discharge.  Following problems were addressed during her hospitalization:  Acute pancreatitis: Most likely  gallstone pancreatitis.  Presented with epigastric abdominal pain.   CT abdomen/pelvis showed features of pancreatitis,gallstones.  Elevated liver enzymes, lipase and these are all improving .  Not a candidate for ERCP as per GI. cholecystectomy.  Abdominal pain has resolved.  She is tolerating diet.  General surgery planning for outpatient cholecystectomy.  Elevated troponin/multivessel coronary artery disease: Significant elevated troponin on presentation.  Denied any  chest pain.  Nonspecific changes in the EKG.  Echocardiogram  showed ejection fraction of 50%, possible apical wall motion abnormality.   She has history of coronary artery disease and is status post CABG. She  had an episode of V. tach on 1/10 that required shock by ICD .She underwent cardiac cath on 1/11 which showed multivessel disease including total occlusion of diagonal, diffuse disease and ultimate total occlusion of the mid LAD, total occlusion of the proximal ramus branch, total occlusion of the mid circumflex, total occlusion of the right RCA. Plan is to continue the medical management.  She will follow-up with cardiology as an outpatient. Check EKG in a week during follow-up with the PCP for QT interval.QT is still prolonged.  Suspected DKA: Low CO2, elevated anion gap on presentation .  History of diabetes type 2.  On insulin at home.  Off insulin drip now.  Started on Lantus and sliding scale.  Continue monitoring sugars.   Diabetic coordinator was  following.  Continue home regimen on discharge.  She follows with her endocrinologist.  Chronic combined  CHF: Currently euvolemic .  Has AICD.  Pacemaker interrogated without finding of VT.  Echocardiogram as above . Cardiology was  following.  She is on carvedilol, Lasix, Imdur, lisinopril, statin, spironolactone at home .  We will decrease the dose of Lasix to 40 mg daily.  Acute on CKD stage IIIa: Secondary to vomiting, poor oral intake.Resolved. Baseline creatinine  around 1.2.  Kidney function at baseline.  Check BMP in a week  Suspected UTI:  UA was suggestive of UTI.  Urine culture showed proteus .  Denies dysuria.  Abx changed to oral.  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  On Xarelto at home.   Lactic acidosis: Secondary to dehydration.  Lactate level normalised.   Debility/deconditioning: PT evaluated her and recommended home health.  Discharge Diagnoses:  Principal Problem:   S/P CABG x 5 Active Problems:   Chronic combined systolic and diastolic heart failure (HCC)   AKI (acute kidney injury) ([place])   DVT (deep venous thrombosis) (HCC)   Acute pancreatitis   DKA (diabetic ketoacidoses) (HCC)   Abnormal LFTs   Demand ischemia  [facility])    Discharge Instructions  Discharge Instructions    Diet Carb Modified   Complete by: As directed    Discharge instructions   Complete by: As directed    1)Please take your medications as instructed. 2)Follow up with your PCP in a week.  Do a CBC, BMP test and an EKG during the follow-up. 3)You will be called from your cardiologist's office next appointment 4)Follow up with general surgery as an outpatient in 3 months.  Name and number of  the provider has been attached.  Call for appointment 5)Follow up with your endocrinologist.   Increase activity slowly   Complete by: As directed      Allergies as of 01/21/2019      Reactions   Lipitor  [atorvastatin Calcium] Rash   Metformin And Related Itching, Swelling, Other (See Comments)   Leg pain & swelling in legs   Atorvastatin Itching, Rash   Levofloxacin Itching      Medication List    TAKE these medications   albuterol 108 (90 Base) MCG/ACT inhaler Commonly known as: VENTOLIN HFA Inhale 2 puffs into the lungs every 6 (six) hours as needed for wheezing or shortness of breath.   albuterol (2.5 MG/3ML) 0.083% nebulizer solution Commonly known as: PROVENTIL Take 2.5 mg by  nebulization every 6 (six) hours as needed for wheezing or shortness of breath.   amitriptyline 10 MG tablet Commonly known as: ELAVIL Take 10 mg by mouth daily.   carvedilol 12.5 MG tablet Commonly known as: COREG Take 12.5 mg by mouth 2 (two) times daily with a meal.   ferrous sulfate 300 (60 Fe) MG/5ML syrup Take 300 mg by mouth daily with lunch.   furosemide 80 MG tablet Commonly known as: LASIX Take 0.5 tablets (40 mg total) by mouth daily. What changed:   how much to take  when to take this   glipiZIDE 5 MG tablet Commonly known as: GLUCOTROL Take 5 mg by mouth daily before breakfast.   isosorbide mononitrate 30 MG 24 hr tablet Commonly known as: IMDUR TAKE 1 TABLET (30 MG TOTAL) BY MOUTH DAILY.   ketoconazole 2 % cream Commonly known as: NIZORAL Apply 1 application topically 2 (two) times daily as needed (for skin irritation).   latanoprost 0.005 % ophthalmic solution Commonly known as: XALATAN Place 1 drop into both eyes at bedtime.   Levemir FlexTouch 100 UNIT/ML Pen Generic drug: Insulin Detemir Inject 15-20 Units into the skin at bedtime. Patient states she takes every other day in the pm.  Patient takes 15-20 units if glucose  is greater than 140 per patient   lisinopril 10 MG tablet Commonly known as: ZESTRIL Take 10 mg by mouth daily.   methocarbamol 500 MG tablet Commonly known as: ROBAXIN Take 1 tablet (500 mg total) by mouth every 8 (eight) hours as needed for muscle spasms.   MUSCLE RELIEF EX Apply 1 application topically 3 (three) times daily as needed. For pain   nitroGLYCERIN 0.4 MG SL tablet Commonly known as: NITROSTAT Place 0.4 mg under the tongue every 5 (five) minutes as needed for chest pain.   NovoLOG FlexPen 100 UNIT/ML FlexPen Generic drug: insulin aspart Inject 30-50 Units into the skin See admin instructions. 50 units every morning, 25 units at lunch and 50 units at supper *Hold for low blood sugar*   oxybutynin 10 MG 24 hr  tablet Commonly known as: DITROPAN-XL Take 10 mg by mouth daily.   OZEMPIC (0.25 OR 0.5 MG/DOSE) Ventana Inject 0.5 mg into the skin once a week.   Potassium Chloride ER 20 MEQ Tbcr Take 20 mEq by mouth daily.   rosuvastatin 20 MG tablet Commonly known as: CRESTOR Take 1 tablet (20 mg total) by mouth daily at 6 PM.   spironolactone 25 MG tablet Commonly known as: ALDACTONE Take 25 mg by mouth daily.   tolterodine 4 MG 24 hr capsule Commonly known as: DETROL LA Take 4 mg by mouth daily.   traMADol 50 MG tablet Commonly known as: ULTRAM Take 50 mg by mouth 2 (two) times daily.   Vitamin D (Ergocalciferol) 1.25 MG (50000 UNIT) Caps capsule Commonly known as: DRISDOL Take 1 capsule (50,000 Units total) by mouth every 7 (seven) days.   Xarelto 20 MG Tabs tablet Generic drug: rivaroxaban Take 20 mg by mouth daily.      Follow-up Information    Armandina Gemma, MD. Schedule an appointment as soon as possible for a visit.   Specialty: General Surgery Why: Please call to make an appointment in ~66months  Contact information: [place] [address] [phone]        Everardo Beals, NP. Schedule an appointment as soon as possible for a visit in 1 week(s).   Contact information: [place] [address] [phone]        Jerline Pain, MD. Schedule an appointment as soon as possible for a visit in 2 week(s).   Specialty: Cardiology Contact information: [id number] [place] [address] [phone]          Allergies  Allergen Reactions  . Lipitor  [Atorvastatin Calcium] Rash  . Metformin And Related Itching, Swelling and Other (See Comments)    Leg pain & swelling in legs  . Atorvastatin Itching and Rash  . Levofloxacin Itching    Consultations:  Cardiology, general surgery, GI   Procedures/Studies: CT ABDOMEN PELVIS WO CONTRAST  Result Date: 01/12/2019 CLINICAL DATA:  Nausea and  vomiting. EXAM: CT ABDOMEN AND PELVIS WITHOUT CONTRAST TECHNIQUE: Multidetector CT imaging of the abdomen and pelvis was performed following the standard protocol without IV contrast. COMPARISON:  May 26, 2014 FINDINGS: Lower chest: Multiple sternal wires are noted. Mild areas of atelectasis and/or infiltrate are seen within the bilateral lung bases. Hepatobiliary: No focal liver abnormality is seen. Numerous subcentimeter gallstones are seen within a mildly distended gallbladder. Pancreas: There is a moderate to marked amount of mesenteric inflammatory fat stranding within the upper abdomen. This involves the peripancreatic region and extends superiorly along the perigastric region. Inferior  extension is seen surrounding the descending colon. Spleen: Normal in size without focal abnormality. Adrenals/Urinary Tract: Adrenal glands are unremarkable. Kidneys are normal in size, without focal lesion or hydronephrosis. A 1.1 cm cluster of subcentimeter non-obstructing renal stones are seen within the mid right kidney. Bladder is unremarkable. Stomach/Bowel: There is a small hiatal hernia. Appendix appears normal. No evidence of bowel wall thickening or distention. Vascular/Lymphatic: There is marked severity aortic atherosclerosis. A stent is seen within the left common iliac vein. No enlarged abdominal or pelvic lymph nodes. Reproductive: The uterus is unremarkable. A 4.9 cm x 3.8 cm cystic appearing areas seen within the right adnexa. Other: A 3.9 cm x 2.9 cm fat containing periumbilical hernia is seen. Musculoskeletal: No acute or significant osseous findings. IMPRESSION: 1. Moderate to marked severity inflammatory process within the upper abdomen. This may be consistent acute pancreatitis, however, given the pattern of distribution of inflammatory fat stranding, associated inflammatory processes involving the stomach and/or descending colon cannot be excluded. 2. Cholelithiasis. 3. Right adnexal cyst, likely ovarian  in origin. Electronically Signed   By: Virgina Norfolk M.D.   On: 01/12/2019 21:19   CARDIAC CATHETERIZATION  Result Date: 01/19/2019  Severe diffuse three-vessel coronary disease with functional total occlusion of the mid LAD, total occlusion of the large first diagonal, total occlusion of the proximal ramus intermedius, total occlusion of the proximal circumflex, and total occlusion of the mid RCA.  All vessels are heavily calcified.  Patent saphenous vein graft to the PDA.  Moderate diffuse disease beyond the graft insertion site  Patent sequential saphenous vein graft to the first and second obtuse marginal.  Diffuse native vessel disease beyond the bypass graft insertion site.  Patent saphenous vein graft to the ramus intermedius.  Diffuse native vessel disease beyond the graft insertion site.  Patent left internal mammary graft to the dominant diagonal.  Diffuse native vessel disease beyond the graft insertion site.  Chronic systolic heart failure with EF less than 35% and LVEDP 22 mmHg. RECOMMENDATIONS:  Guideline directed therapy for systolic heart failure.  NM Myocar Multi W/Spect W/Wall Motion / EF  Result Date: 01/17/2019 CLINICAL DATA:  Preoperative study prior to intermediate risk surgery. EXAM: MYOCARDIAL IMAGING WITH SPECT (REST AND PHARMACOLOGIC-STRESS) GATED LEFT VENTRICULAR WALL MOTION STUDY LEFT VENTRICULAR EJECTION FRACTION TECHNIQUE: Standard myocardial SPECT imaging was performed after resting intravenous injection of 10.23 mCi Tc-24m tetrofosmin. Subsequently, intravenous infusion of Lexiscan was performed under the supervision of the Cardiology staff. At peak effect of the drug, 32.5 mCi Tc-49m tetrofosmin was injected intravenously and standard myocardial SPECT imaging was performed. Quantitative gated imaging was also performed to evaluate left ventricular wall motion, and estimate left ventricular ejection fraction. COMPARISON:  May 30, 2014 FINDINGS: Perfusion: There is a  large primarily fixed defect in the septum, particularly from the mid wall to the apex which is worse when compared to 2016. There may be a small amount of reversibility in the anterior septum. There is a small fixed defect in the anterior wall towards the apex. There may be a small amount of reversibility in the anterior wall which is subtle. There is a large posterolateral wall defect which is stable. There is a large posterior wall defect which is stable. There is some preserved activity in the mid posterior wall. There is a large fixed defect at the apex. Wall Motion: Global hypokinesis. Dyskinesis in the septum. No thickening in the region of fixed defects. Left Ventricular Ejection Fraction: 123456 % End diastolic volume [id number]  ml End systolic volume 0000000 ml IMPRESSION: 1. Fixed defects involve the septum, apex, anterior wall towards the apex, posterior wall, and posterolateral wall. The size of the fixed defects is worse in the septum in the interval. There may be a small amount of reversibility in the anterior septum as well as in the anterior wall. The possible mild reversibility a subtle. 2. Global hypokinesis. No thickening in the region of fixed defects. Dyskinesis of the septum. 3. Left ventricular ejection fraction 20% 4. Non invasive risk stratification*: High *2012 Appropriate Use Criteria for Coronary Revascularization Focused Update: J Am Coll Cardiol. N6492421. http://content.airportbarriers.com.aspx?articleid=1201161 Electronically Signed   By: Dorise Bullion III M.D   On: 01/17/2019 14:01   DG Chest Portable 1 View  Result Date: 01/12/2019 CLINICAL DATA:  Shortness of breath. Persistent vomiting. EXAM: PORTABLE CHEST 1 VIEW COMPARISON:  10/05/2015 FINDINGS: Heart size and pulmonary vascularity are within normal limits considering the AP portable technique. Aortic atherosclerosis. AICD. New slight bibasilar atelectasis. No consolidative infiltrates or effusions. No acute bone  abnormality. CABG. IMPRESSION: New slight bibasilar atelectasis. Aortic Atherosclerosis (ICD10-I70.0). Electronically Signed   By: Lorriane Shire M.D.   On: 01/12/2019 18:34   ECHOCARDIOGRAM COMPLETE  Result Date: 01/13/2019   ECHOCARDIOGRAM REPORT   Patient Name:   KRYSTALEE SHIBUYA Date of Exam: 01/13/2019 Medical Rec #:  [id number]      Height:       63.0 in Accession #:    [id number]     Weight:       229.0 lb Date of Birth:  [birth year]      BSA:          2.05 m Patient Age:    20 years       BP:           151/96 mmHg Patient Gender: F              HR:           116 bpm. Exam Location:  Inpatient Procedure: 2D Echo, Color Doppler, Cardiac Doppler and Intracardiac            Opacification Agent Indications:    Elevated Troponin  History:        Patient has prior history of Echocardiogram examinations, most                 recent 03/13/2015. CHF, Defibrillator and Prior CABG,                 Arrythmias:LBBB; Risk Factors:Dyslipidemia, Diabetes,                 Hypertension and Sleep Apnea.  Sonographer:    Raquel Sarna Senior RDCS Referring Phys: [id number] Daune Perch  Sonographer Comments: Technically difficult study due to poor echo windows, patient is morbidly obese, no apical window and no subcostal window. Image acquisition challenging due to patient body habitus. IMPRESSIONS  1. Suboptimal image quality for diagnosis of biventricular function and wall motion.  2. Left ventricular ejection fraction, by visual estimation, is 50%. The left ventricle has limited views for evaluation of function. Possible apical wall motion abnormality. There is mildly increased left ventricular hypertrophy.  3. Global right ventricle was not well visualized.The right ventricular size is not well visualized. Right vetricular wall thickness was not assessed.  4. Left atrial size was not well visualized.  5. Right atrial size was not well visualized.  6. Mild mitral annular calcification.  7. The mitral valve is abnormal. No evidence  of  mitral valve regurgitation. No evidence of mitral stenosis.  8. The tricuspid valve is not well visualized.  9. The aortic valve was not well visualized. Aortic valve regurgitation is not visualized. No evidence of aortic valve sclerosis or stenosis. FINDINGS  Left Ventricle: Left ventricular ejection fraction, by visual estimation, is 50%. The left ventricle has limited views for evaluation of function. The left ventricle is not well visualized. There is mildly increased left ventricular hypertrophy. Left ventricular diastolic parameters are indeterminate. Normal left atrial pressure. Right Ventricle: The right ventricular size is not well visualized. Right vetricular wall thickness was not assessed. Global RV systolic function is was not well visualized. Left Atrium: Left atrial size was not well visualized. Right Atrium: Right atrial size was not well visualized Pericardium: There is no evidence of pericardial effusion. Mitral Valve: The mitral valve is abnormal. Mild mitral annular calcification. No evidence of mitral valve regurgitation. No evidence of mitral valve stenosis by observation. Tricuspid Valve: The tricuspid valve is not well visualized. Tricuspid valve regurgitation is not demonstrated. Aortic Valve: The aortic valve was not well visualized. Aortic valve regurgitation is not visualized. The aortic valve is structurally normal, with no evidence of sclerosis or stenosis. Pulmonic Valve: The pulmonic valve was not well visualized. Pulmonic valve regurgitation is mild. Pulmonic regurgitation is mild. Aorta: The ascending aorta was not well visualized, the aortic root is normal in size and structure and the aortic arch was not well visualized. Venous: The inferior vena cava was not well visualized. IAS/Shunts: The interatrial septum was not assessed.  LEFT VENTRICLE PLAX 2D LVIDd:         4.90 cm LVIDs:         4.00 cm LV PW:         1.30 cm LV IVS:        1.30 cm LVOT diam:     2.00 cm LV SV:          43 ml LV SV Index:   19.43 LVOT Area:     3.14 cm  LEFT ATRIUM           Index LA diam:      3.70 cm 1.81 cm/m LA Vol (A4C): 58.5 ml 28.56 ml/m  AORTIC VALVE LVOT Vmax:   69.00 cm/s LVOT Vmean:  51.400 cm/s LVOT VTI:    0.095 m  AORTA Ao Root diam: 3.00 cm MITRAL VALVE MV Area (PHT): 6.83 cm              SHUNTS MV PHT:        32.19 msec            Systemic VTI:  0.10 m MV Decel Time: 111 msec              Systemic Diam: 2.00 cm MV E velocity: 127.00 cm/s 103 cm/s MV A velocity: 42.20 cm/s  70.3 cm/s MV E/A ratio:  3.01        1.5  Cherlynn Kaiser MD Electronically signed by Cherlynn Kaiser MD Signature Date/Time: 01/13/2019/8:56:38 PM    Final    US Abdomen Limited RUQ  Result Date: 01/16/2019 CLINICAL DATA:  Gallstone pancreatitis. EXAM: ULTRASOUND ABDOMEN LIMITED RIGHT UPPER QUADRANT COMPARISON:  CT abdomen/pelvis 01/12/2019 FINDINGS: The examination is somewhat limited due to patient body habitus. Gallbladder: There are echogenic foci within the body and neck of the gallbladder consistent with gallstones demonstrated on CT abdomen/pelvis 01/12/2019. No gallbladder wall thickening. No sonographic Percell Miller sign is elicited by  the scanning technologist. Common bile duct: Diameter: The common duct is mildly dilated measuring up to 8 mm in diameter. Liver: No focal lesion identified. Mildly increased hepatic parenchymal echogenicity. Interrogated portal vein is patent on color Doppler imaging with normal direction of blood flow towards the liver. Other: The pancreas is not visualized. IMPRESSION: Cholecystolithiasis without sonographic evidence of acute cholecystitis. Mildly dilated common duct measuring up to 8 mm in diameter. Mildly increased hepatic parenchymal echogenicity. This is a nonspecific finding, which may be seen in the setting of hepatic steatosis or other chronic hepatic parenchymal disease. Electronically Signed   By: Kellie Simmering DO   On: 01/16/2019 13:27      Subjective:  Patient seen and  examined at the bedside this morning.  Hemodynamically stable for discharge today.  I discussed with daughter about the discharge planning.  Discharge Exam: Vitals:   01/21/19 0354 01/21/19 0920  BP: (!) 122/55   Pulse: (!) 54 (!) 58  Resp: 20   Temp: 98.5 F (36.9 C)   SpO2: 100%    Vitals:   01/20/19 1526 01/20/19 2043 01/21/19 0354 01/21/19 0920  BP: (!) 145/63 (!) 144/64 (!) 122/55   Pulse: (!) 57 (!) 57 (!) 54 (!) 58  Resp: (!) 22 20 20    Temp: 98.3 F (36.8 C) 98.5 F (36.9 C) 98.5 F (36.9 C)   TempSrc: Oral Oral Oral   SpO2: 100% 100% 100%   Weight:      Height:        General: Pt is alert, awake, not in acute distress Cardiovascular: RRR, S1/S2 +, no rubs, no gallops Respiratory: CTA bilaterally, no wheezing, no rhonchi Abdominal: Soft, NT, ND, bowel sounds + Extremities: no edema, no cyanosis    The results of significant diagnostics from this hospitalization (including imaging, microbiology, ancillary and laboratory) are listed below for reference.     Microbiology: Recent Results (from the past 240 hour(s))  SARS CORONAVIRUS 2 (TAT 6-24 HRS) Nasopharyngeal Nasopharyngeal Swab     Status: None   Collection Time: 01/12/19  8:45 PM   Specimen: Nasopharyngeal Swab  Result Value Ref Range Status   SARS Coronavirus 2 NEGATIVE NEGATIVE Final    Comment: (NOTE) SARS-CoV-2 target nucleic acids are NOT DETECTED. The SARS-CoV-2 RNA is generally detectable in upper and lower respiratory specimens during the acute phase of infection. Negative results do not preclude SARS-CoV-2 infection, do not rule out co-infections with other pathogens, and should not be used as the sole basis for treatment or other patient management decisions. Negative results must be combined with clinical observations, patient history, and epidemiological information. The expected result is Negative. Fact Sheet for Patients: SugarRoll.be Fact Sheet for  Healthcare Providers: https://www.woods-mathews.com/ This test is not yet approved or cleared by the [place] FDA and  has been authorized for detection and/or diagnosis of SARS-CoV-2 by FDA under an Emergency Use Authorization (EUA). This EUA will remain  in effect (meaning this test can be used) for the duration of the COVID-19 declaration under Section 56 4(b)(1) of the Act, 21 U.S.C. section 360bbb-3(b)(1), unless the authorization is terminated or revoked sooner. Performed at [facility], [place] [address] [place], [place] [address]   Urine culture     Status: Abnormal   Collection Time: 01/12/19 10:43 PM   Specimen: Urine, Clean Catch  Result Value Ref Range Status   Specimen Description   Final    URINE, CLEAN CATCH Performed at [facility], [place]  [address], [place], [place] [address]    Special Requests   Final    NONE Performed at [facility] [facility], [place] [address], [place], [place] [address]    Culture >=100,000 COLONIES/mL PROTEUS MIRABILIS (A)  Final   Report Status 01/15/2019 FINAL  Final   Organism ID, Bacteria PROTEUS MIRABILIS (A)  Final      Susceptibility   Proteus mirabilis - MIC*    AMPICILLIN <=2 SENSITIVE Sensitive     CEFAZOLIN <=4 SENSITIVE Sensitive     CEFTRIAXONE <=0.25 SENSITIVE Sensitive     CIPROFLOXACIN <=0.25 SENSITIVE Sensitive     GENTAMICIN <=1 SENSITIVE Sensitive     IMIPENEM 2 SENSITIVE Sensitive     NITROFURANTOIN 128 RESISTANT Resistant     TRIMETH/SULFA <=20 SENSITIVE Sensitive     AMPICILLIN/SULBACTAM <=2 SENSITIVE Sensitive     PIP/TAZO <=4 SENSITIVE Sensitive     * >=100,000 COLONIES/mL PROTEUS MIRABILIS  MRSA PCR Screening     Status: None   Collection Time: 01/13/19  5:43 PM   Specimen: Nasopharyngeal  Result Value Ref Range Status   MRSA by PCR NEGATIVE NEGATIVE Final    Comment:        The GeneXpert MRSA Assay (FDA approved for NASAL specimens only), is one component of  a comprehensive MRSA colonization surveillance program. It is not intended to diagnose MRSA infection nor to guide or monitor treatment for MRSA infections. Performed at [facility], [place] [address], [place], [place] [address]      Labs: BNP (last 3 results) Recent Labs    11/17/18 1446 01/12/19 1857 01/19/19 0200  BNP 30.6 88.3 [id number]*   Basic Metabolic Panel: Recent Labs  Lab 01/16/19 0214 01/17/19 0132 01/18/19 0123 01/19/19 0200 01/21/19 0534  NA 139 141 139 138 136  K 4.3 4.2 3.7 3.5 3.8  CL 109 110 108 108 105  CO2 23 24 23 23 24   GLUCOSE 211* 188* 99 152* 114*  BUN 30* 26* 17 16 12   CREATININE 1.43* 1.35* 1.03* 1.05* 1.08*  CALCIUM 8.4* 8.6* 8.3* 8.1* 8.4*  MG  --  2.4  --  2.3  --   PHOS  --  1.7* 1.6* 2.9  --    Liver Function Tests: Recent Labs  Lab 01/15/19 0204 01/16/19 0214 01/17/19 0132  AST 71* 34 29  ALT 167* 114* 80*  ALKPHOS 109 95 123  BILITOT 1.6* 1.0 0.9  PROT 6.4* 6.3* 6.1*  ALBUMIN 2.7* 2.4* 2.4*   Recent Labs  Lab 01/15/19 0204 01/17/19 0132 01/18/19 0123  LIPASE 359* 42 48   No results for input(s): AMMONIA in the last 168 hours. CBC: Recent Labs  Lab 01/15/19 0204 01/16/19 0214 01/17/19 0132 01/18/19 0123 01/19/19 0200  WBC 11.7* 12.5* 11.1* 11.2* 10.1  HGB 12.3 11.3* 10.5* 10.7* 10.2*  HCT 39.3 35.0* 33.6* 33.4* 32.5*  MCV 101.6* 100.0 101.8* 100.3* 100.3*  PLT 120* 117* 129* 130* 131*   Cardiac Enzymes: No results for input(s): CKTOTAL, CKMB, CKMBINDEX, TROPONINI in the last 168 hours. BNP: Invalid input(s): POCBNP CBG: Recent Labs  Lab 01/20/19 0752 01/20/19 1347 01/20/19 1621 01/20/19 2325 01/21/19 0746  GLUCAP 180* 241* 244* 108* 117*   D-Dimer No results for input(s): DDIMER in the last 72 hours. Hgb A1c No results for input(s): HGBA1C in the last 72 hours. Lipid Profile No results for input(s): CHOL, HDL, LDLCALC, TRIG, CHOLHDL, LDLDIRECT in the last 72 hours. Thyroid  function studies No results for input(s): TSH,  T4TOTAL, T3FREE, THYROIDAB in the last 72 hours.  Invalid input(s): FREET3 Anemia work up No results for input(s): VITAMINB12, FOLATE, FERRITIN, TIBC, IRON, RETICCTPCT in the last 72 hours. Urinalysis    Component Value Date/Time   COLORURINE YELLOW 01/12/2019 2045   APPEARANCEUR HAZY (A) 01/12/2019 2045   LABSPEC 1.017 01/12/2019 2045   PHURINE 8.0 01/12/2019 2045   GLUCOSEU >=500 (A) 01/12/2019 2045   HGBUR NEGATIVE 01/12/2019 2045   BILIRUBINUR NEGATIVE 01/12/2019 2045   KETONESUR 5 (A) 01/12/2019 2045   PROTEINUR 100 (A) 01/12/2019 2045   UROBILINOGEN 1.0 05/25/2014 2027   NITRITE NEGATIVE 01/12/2019 2045   LEUKOCYTESUR LARGE (A) 01/12/2019 2045   Sepsis Labs Invalid input(s): PROCALCITONIN,  WBC,  LACTICIDVEN Microbiology Recent Results (from the past 240 hour(s))  SARS CORONAVIRUS 2 (TAT 6-24 HRS) Nasopharyngeal Nasopharyngeal Swab     Status: None   Collection Time: 01/12/19  8:45 PM   Specimen: Nasopharyngeal Swab  Result Value Ref Range Status   SARS Coronavirus 2 NEGATIVE NEGATIVE Final    Comment: (NOTE) SARS-CoV-2 target nucleic acids are NOT DETECTED. The SARS-CoV-2 RNA is generally detectable in upper and lower respiratory specimens during the acute phase of infection. Negative results do not preclude SARS-CoV-2 infection, do not rule out co-infections with other pathogens, and should not be used as the sole basis for treatment or other patient management decisions. Negative results must be combined with clinical observations, patient history, and epidemiological information. The expected result is Negative. Fact Sheet for Patients: SugarRoll.be Fact Sheet for Healthcare Providers: https://www.woods-mathews.com/ This test is not yet approved or cleared by the [place] FDA and  has been authorized for detection and/or diagnosis of SARS-CoV-2 by FDA under an Emergency  Use Authorization (EUA). This EUA will remain  in effect (meaning this test can be used) for the duration of the COVID-19 declaration under Section 56 4(b)(1) of the Act, 21 U.S.C. section 360bbb-3(b)(1), unless the authorization is terminated or revoked sooner. Performed at [facility], [place] [address], [place], [place] [address]   Urine culture     Status: Abnormal   Collection Time: 01/12/19 10:43 PM   Specimen: Urine, Clean Catch  Result Value Ref Range Status   Specimen Description   Final    URINE, CLEAN CATCH Performed at [facility], [place] [address] [place], [place] [address]    Special Requests   Final    NONE Performed at [facility], [place] [address] [place],  [address]    Culture >=100,000 COLONIES/mL PROTEUS MIRABILIS (A)  Final   Report Status 01/15/2019 FINAL  Final   Organism ID, Bacteria PROTEUS MIRABILIS (A)  Final      Susceptibility   Proteus mirabilis - MIC*    AMPICILLIN <=2 SENSITIVE Sensitive     CEFAZOLIN <=4 SENSITIVE Sensitive     CEFTRIAXONE <=0.25 SENSITIVE Sensitive     CIPROFLOXACIN <=0.25 SENSITIVE Sensitive     GENTAMICIN <=1 SENSITIVE Sensitive     IMIPENEM 2 SENSITIVE Sensitive     NITROFURANTOIN 128 RESISTANT Resistant     TRIMETH/SULFA <=20 SENSITIVE Sensitive     AMPICILLIN/SULBACTAM <=2 SENSITIVE Sensitive     PIP/TAZO <=4 SENSITIVE Sensitive     * >=100,000 COLONIES/mL PROTEUS MIRABILIS  MRSA PCR Screening     Status: None   Collection Time: 01/13/19  5:43 PM   Specimen: Nasopharyngeal  Result Value Ref Range Status   MRSA by PCR NEGATIVE NEGATIVE Final    Comment:  The GeneXpert MRSA Assay (FDA approved for NASAL specimens only), is one component of a comprehensive MRSA colonization surveillance program. It is not intended to diagnose MRSA infection nor to guide or monitor treatment for MRSA infections. Performed at [facility], [place] [address] [place], [place] [address]     Please note: You were cared for by a hospitalist during your hospital stay. Once you are discharged, your primary care physician will handle any further medical issues. Please note that NO REFILLS for any discharge medications will be authorized once you are discharged, as it is imperative that you return to your primary care physician (or establish a relationship with a primary care physician if you do not have one) for your post hospital discharge needs so that they can reassess your need for medications and monitor your lab values.    Time coordinating discharge: 40 minutes  SIGNED:   Shelly Coss, MD  [facility] 01/21/2019, 11:14 AM Pager [phone]  If 7PM-7AM, please contact night-coverage [email or web address] Password [email or web address]

## 2019-01-21 NOTE — Care Management Important Message (Signed)
Important Message  Patient Details IM Letter given to Dessa Phi RN Case Manager to present to the Patient Name: [name] MRN: [id number] Date of Birth: 1950/05/14   Medicare Important Message Given:  Yes     Kerin Salen 01/21/2019, 12:41 PM

## 2019-01-21 NOTE — TOC Transition Note (Signed)
Transition of Care [facility]) - CM/SW Discharge Note   Patient Details  Name: [name] MRN: [id number] Date of Birth: 02/14/1950  Transition of Care [facility]) CM/SW Contact:  Dessa Phi, RN Phone Number: 01/21/2019, 12:03 PM   Clinical Narrative:  D/c home w/HHC-rep Tommi Rumps able to provide HHPT/OT. Patient has dme-cane,rw,3n1. Already has home 02-[facility]. Has own transport home. No further CM needs.     Final next level of care: [place] Barriers to Discharge: No Barriers Identified   Patient Goals and CMS Choice        Discharge Placement                       Discharge Plan and Services                          HH Arranged: PT, OT [place] Agency: [place] Date [place]: 01/21/19 Time [place]: 1202 Representative spoke with at [place]: [place] Determinants of Health ([place]) Interventions     Readmission Risk Interventions No flowsheet data found.

## 2019-01-21 NOTE — Progress Notes (Signed)
PT Cancellation Note  Patient Details Name: [name] MRN: [id number] DOB: 16-Mar-1950   Cancelled Treatment:    Reason Eval/Treat Not Completed: Patient declined, no reason specified. Pt refused therapy today due to wanting to save energy for getting home and settled later today.   Talbot Grumbling PT, DPT 01/21/19, 3:26 PM [phone]

## 2019-01-21 NOTE — Progress Notes (Signed)
Progress Note  Patient Name: [name] Date of Encounter: 01/21/2019  Primary Cardiologist: Candee Furbish, MD   Subjective   Eating breakfast no cardiac complaints   Inpatient Medications    Scheduled Meds: . carvedilol  25 mg Oral BID WC  . Chlorhexidine Gluconate Cloth  6 each Topical Daily  . insulin aspart  0-5 Units Subcutaneous QHS  . insulin aspart  0-9 Units Subcutaneous TID WC  . insulin aspart  5 Units Subcutaneous TID WC  . insulin glargine  35 Units Subcutaneous Daily  . isosorbide mononitrate  30 mg Oral Daily  . latanoprost  1 drop Both Eyes QHS  . mouth rinse  15 mL Mouth Rinse BID  . rivaroxaban  20 mg Oral Q supper  . sodium chloride flush  3 mL Intravenous Q12H   Continuous Infusions: . sodium chloride     PRN Meds: sodium chloride, acetaminophen, dextrose, guaiFENesin-dextromethorphan, HYDROcodone-acetaminophen, ipratropium-albuterol, morphine injection, ondansetron (ZOFRAN) IV, ondansetron (ZOFRAN) IV, oxyCODONE, sodium chloride flush   Vital Signs    Vitals:   01/20/19 1526 01/20/19 2043 01/21/19 0354 01/21/19 0920  BP: (!) 145/63 (!) 144/64 (!) 122/55   Pulse: (!) 57 (!) 57 (!) 54 (!) 58  Resp: (!) 22 20 20    Temp: 98.3 F (36.8 C) 98.5 F (36.9 C) 98.5 F (36.9 C)   TempSrc: Oral Oral Oral   SpO2: 100% 100% 100%   Weight:      Height:        Intake/Output Summary (Last 24 hours) at 01/21/2019 0931 Last data filed at 01/21/2019 0759 Gross per 24 hour  Intake 120 ml  Output 850 ml  Net -730 ml   Last 3 Weights 01/18/2019 01/12/2019 12/30/2018  Weight (lbs) 261 lb 11 oz 229 lb 229 lb  Weight (kg) 118.7 kg 103.874 kg 103.874 kg      Telemetry    NSR 01/21/2019   ECG    SR QT 582 prolonged lateral T wave inversions   Physical Exam   VS:  BP (!) 122/55 (BP Location: Right Arm)   Pulse (!) 58   Temp 98.5 F (36.9 C) (Oral)   Resp 20   Ht 5\' 3"  (1.6 m)   Wt 118.7 kg   SpO2 100%   BMI 46.36 kg/m  , BMI Body mass index is  46.36 kg/m.  Obese black female  HEENT: normal Neck: no JVD Cardiac: RRR; no murmurs distant AICD under left clavicle  Respiratory:  clear to auscultation bilaterally GI: soft, TTP  MS: no deformity or atrophy Skin: warm and dry, no rash Neuro:  Alert and Oriented x 3 Psych: normal affect Left radial cath site A     Labs    High Sensitivity Troponin:   Recent Labs  Lab 01/12/19 1857 01/13/19 0651 01/13/19 0849 01/13/19 1044 01/13/19 1356  TROPONINIHS 86* 688* 843* 1,023* 1,023*      Chemistry Recent Labs  Lab 01/15/19 0204 01/16/19 0214 01/17/19 0132 01/18/19 0123 01/19/19 0200 01/21/19 0534  NA 134* 139 141 139 138 136  K 4.5 4.3 4.2 3.7 3.5 3.8  CL 104 109 110 108 108 105  CO2 20* 23 24 23 23 24   GLUCOSE 313* 211* 188* 99 152* 114*  BUN 33* 30* 26* 17 16 12   CREATININE 1.58* 1.43* 1.35* 1.03* 1.05* 1.08*  CALCIUM 8.1* 8.4* 8.6* 8.3* 8.1* 8.4*  PROT 6.4* 6.3* 6.1*  --   --   --   ALBUMIN 2.7* 2.4* 2.4*  --   --   --  AST 71* 34 29  --   --   --   ALT 167* 114* 80*  --   --   --   ALKPHOS 109 95 123  --   --   --   BILITOT 1.6* 1.0 0.9  --   --   --   GFRNONAA 33* 38* 40* 56* 55* 53*  GFRAA 39* 44* 47* >60 >60 >60  ANIONGAP 10 7 7 8 7 7      Hematology Recent Labs  Lab 01/17/19 0132 01/18/19 0123 01/19/19 0200  WBC 11.1* 11.2* 10.1  RBC 3.30* 3.33* 3.24*  HGB 10.5* 10.7* 10.2*  HCT 33.6* 33.4* 32.5*  MCV 101.8* 100.3* 100.3*  MCH 31.8 32.1 31.5  MCHC 31.3 32.0 31.4  RDW 13.7 13.8 14.2  PLT 129* 130* 131*    BNP Recent Labs  Lab 01/19/19 0200  BNP 533.7*     DDimer No results for input(s): DDIMER in the last 168 hours.   Radiology    CARDIAC CATHETERIZATION  Result Date: 01/19/2019  Severe diffuse three-vessel coronary disease with functional total occlusion of the mid LAD, total occlusion of the large first diagonal, total occlusion of the proximal ramus intermedius, total occlusion of the proximal circumflex, and total  occlusion of the mid RCA.  All vessels are heavily calcified.  Patent saphenous vein graft to the PDA.  Moderate diffuse disease beyond the graft insertion site  Patent sequential saphenous vein graft to the first and second obtuse marginal.  Diffuse native vessel disease beyond the bypass graft insertion site.  Patent saphenous vein graft to the ramus intermedius.  Diffuse native vessel disease beyond the graft insertion site.  Patent left internal mammary graft to the dominant diagonal.  Diffuse native vessel disease beyond the graft insertion site.  Chronic systolic heart failure with EF less than 35% and LVEDP 22 mmHg. RECOMMENDATIONS:  Guideline directed therapy for systolic heart failure.   Cardiac Studies   Echo 01/13/19: IMPRESSIONS   1. Suboptimal image quality for diagnosis of biventricular function and wall motion.  2. Left ventricular ejection fraction, by visual estimation, is 50%. The left ventricle has limited views for evaluation of function. Possible apical wall motion abnormality. There is mildly increased left ventricular hypertrophy.  3. Global right ventricle was not well visualized.The right ventricular size is not well visualized. Right vetricular wall thickness was not assessed.  4. Left atrial size was not well visualized.  5. Right atrial size was not well visualized.  6. Mild mitral annular calcification.  7. The mitral valve is abnormal. No evidence of mitral valve regurgitation. No evidence of mitral stenosis.  8. The tricuspid valve is not well visualized.  9. The aortic valve was not well visualized. Aortic valve regurgitation is not visualized. No evidence of aortic valve sclerosis or stenosis.  Patient Profile     [name] is a 40F with CAD s/p CABG in [id number], chronic systolic and diastolic heart failure LVEF 40 to 45%, VT status post ICD, diabetes, hypertension, OSA, morbid obesity, and OSA admitted with gallstone pancreatitis.  Cardiology consulted for  elevated troponin.  Assessment & Plan    # Elevated troponin: peak 1023 no chest pain EF 50% with no discrete RWMAls previous CABG. Cath 01/18/18 With patent grafts no intervention needed   # Chronic systolic and diastolic heart failure: obese hard to tell volume status LE edema is improved with some Tenting in skin EDP reasonable at cath 22 mmHg    # VT:  ICD shock appropriate Grafts patient will repeat ECG in am to assess QT  # Hypertension: stable on beta blocker   # Hyperlipidemia: continue crestor   # Prior DVT: xarelto on hold continue heparin   # Pancreatitis:  Abdomin non acute per primary service not a candidate for ERCP per GI no surgery Cardiac anatomy and volume currently stable    No further cardiac w/u planned continue medical Rx for CHF     For questions or updates, please contact [facility] Please consult [email or web address] for contact info under        Signed, Jenkins Rouge, MD  01/21/2019, 9:31 AM

## 2019-01-22 ENCOUNTER — Ambulatory Visit (INDEPENDENT_AMBULATORY_CARE_PROVIDER_SITE_OTHER): Payer: Medicare HMO | Admitting: Bariatrics

## 2019-01-23 ENCOUNTER — Ambulatory Visit (INDEPENDENT_AMBULATORY_CARE_PROVIDER_SITE_OTHER): Payer: Medicare HMO | Admitting: *Deleted

## 2019-01-23 DIAGNOSIS — Z95810 Presence of automatic (implantable) cardiac defibrillator: Secondary | ICD-10-CM | POA: Diagnosis not present

## 2019-01-25 LAB — CUP PACEART REMOTE DEVICE CHECK
Battery Remaining Longevity: 37 mo
Battery Remaining Percentage: 35 %
Battery Voltage: 2.89 V
Brady Statistic AP VP Percent: 1 %
Brady Statistic AP VS Percent: 1.1 %
Brady Statistic AS VP Percent: 1 %
Brady Statistic AS VS Percent: 99 %
Brady Statistic RA Percent Paced: 1 %
Brady Statistic RV Percent Paced: 1 %
Date Time Interrogation Session: 20210116172328
HighPow Impedance: 36 Ohm
HighPow Impedance: 36 Ohm
Implantable Lead Implant Date: 20080423
Implantable Lead Implant Date: 20080423
Implantable Lead Implant Date: 20140918
Implantable Lead Location: 753858
Implantable Lead Location: 753859
Implantable Lead Location: 753860
Implantable Lead Model: 5076
Implantable Lead Model: 6947
Implantable Pulse Generator Implant Date: 20140918
Lead Channel Impedance Value: 400 Ohm
Lead Channel Impedance Value: 440 Ohm
Lead Channel Pacing Threshold Amplitude: 0.5 V
Lead Channel Pacing Threshold Amplitude: 0.75 V
Lead Channel Pacing Threshold Pulse Width: 0.5 ms
Lead Channel Pacing Threshold Pulse Width: 0.5 ms
Lead Channel Sensing Intrinsic Amplitude: 1.8 mV
Lead Channel Sensing Intrinsic Amplitude: 12 mV
Lead Channel Setting Pacing Amplitude: 2 V
Lead Channel Setting Pacing Amplitude: 2.5 V
Lead Channel Setting Pacing Pulse Width: 0.5 ms
Lead Channel Setting Sensing Sensitivity: 0.5 mV
Pulse Gen Serial Number: 7070007

## 2019-02-04 ENCOUNTER — Telehealth: Payer: Self-pay

## 2019-02-04 NOTE — Telephone Encounter (Signed)
Discussed appointment with the nurse Maude Leriche at Dr Marlou Porch office and she reports patient does not need an earlier appointment than 02/16/2019 unless she is having symptoms.  Patient is not having any symptoms at this time. No changes.

## 2019-02-04 NOTE — Telephone Encounter (Signed)
Spoke with patient.  She said she was discharged from hospital after having COVID on 01/21/2019 and supposed to make an appointment with Dr Marlou Porch within 2 weeks.  She already has an office appointment on 02/16/2019 and wants to know if she needs one sooner than 2/8.  Advised to keep appointment for 02/16/2019 and I will call her back if a soon appointment is needed.  She is feeling fine at this time and has not complaints.

## 2019-02-09 DIAGNOSIS — E1165 Type 2 diabetes mellitus with hyperglycemia: Secondary | ICD-10-CM

## 2019-02-09 DIAGNOSIS — IMO0002 Reserved for concepts with insufficient information to code with codable children: Secondary | ICD-10-CM

## 2019-02-09 HISTORY — DX: Reserved for concepts with insufficient information to code with codable children: IMO0002

## 2019-02-09 HISTORY — DX: Type 2 diabetes mellitus with hyperglycemia: E11.65

## 2019-02-10 ENCOUNTER — Telehealth: Payer: Self-pay

## 2019-02-10 NOTE — Telephone Encounter (Signed)
Left message for patient to remind of missed remote transmission.  

## 2019-02-13 NOTE — Progress Notes (Signed)
No ICM remote transmission received for 02/09/2019 and next ICM transmission scheduled for 03/02/2019.

## 2019-02-16 ENCOUNTER — Other Ambulatory Visit: Payer: Self-pay

## 2019-02-16 ENCOUNTER — Ambulatory Visit (INDEPENDENT_AMBULATORY_CARE_PROVIDER_SITE_OTHER): Payer: Medicare HMO | Admitting: Cardiology

## 2019-02-16 ENCOUNTER — Encounter: Payer: Self-pay | Admitting: Cardiology

## 2019-02-16 VITALS — BP 90/50 | HR 104 | Ht 63.0 in | Wt 223.8 lb

## 2019-02-16 DIAGNOSIS — I5042 Chronic combined systolic (congestive) and diastolic (congestive) heart failure: Secondary | ICD-10-CM

## 2019-02-16 DIAGNOSIS — Z95810 Presence of automatic (implantable) cardiac defibrillator: Secondary | ICD-10-CM | POA: Diagnosis not present

## 2019-02-16 DIAGNOSIS — I2510 Atherosclerotic heart disease of native coronary artery without angina pectoris: Secondary | ICD-10-CM | POA: Diagnosis not present

## 2019-02-16 DIAGNOSIS — R5383 Other fatigue: Secondary | ICD-10-CM

## 2019-02-16 DIAGNOSIS — Z79899 Other long term (current) drug therapy: Secondary | ICD-10-CM

## 2019-02-16 DIAGNOSIS — Z86718 Personal history of other venous thrombosis and embolism: Secondary | ICD-10-CM | POA: Diagnosis not present

## 2019-02-16 DIAGNOSIS — I255 Ischemic cardiomyopathy: Secondary | ICD-10-CM

## 2019-02-16 LAB — COMPREHENSIVE METABOLIC PANEL
ALT: 21 IU/L (ref 0–32)
AST: 25 IU/L (ref 0–40)
Albumin/Globulin Ratio: 0.8 — ABNORMAL LOW (ref 1.2–2.2)
Albumin: 3.7 g/dL — ABNORMAL LOW (ref 3.8–4.8)
Alkaline Phosphatase: 105 IU/L (ref 39–117)
BUN/Creatinine Ratio: 15 (ref 12–28)
BUN: 31 mg/dL — ABNORMAL HIGH (ref 8–27)
Bilirubin Total: 0.5 mg/dL (ref 0.0–1.2)
CO2: 25 mmol/L (ref 20–29)
Calcium: 10.3 mg/dL (ref 8.7–10.3)
Chloride: 91 mmol/L — ABNORMAL LOW (ref 96–106)
Creatinine, Ser: 2.05 mg/dL — ABNORMAL HIGH (ref 0.57–1.00)
GFR calc Af Amer: 28 mL/min/{1.73_m2} — ABNORMAL LOW (ref >59)
GFR calc non Af Amer: 24 mL/min/{1.73_m2} — ABNORMAL LOW (ref >59)
Globulin, Total: 4.7 g/dL — ABNORMAL HIGH (ref 1.5–4.5)
Glucose: 327 mg/dL — ABNORMAL HIGH (ref 65–99)
Potassium: 4.3 mmol/L (ref 3.5–5.2)
Sodium: 137 mmol/L (ref 134–144)
Total Protein: 8.4 g/dL (ref 6.0–8.5)

## 2019-02-16 LAB — TSH: TSH: 1.38 u[IU]/mL (ref 0.450–4.500)

## 2019-02-16 LAB — CBC
Hematocrit: 38.1 % (ref 34.0–46.6)
Hemoglobin: 11.8 g/dL (ref 11.1–15.9)
MCH: 28.3 pg (ref 26.6–33.0)
MCHC: 31 g/dL — ABNORMAL LOW (ref 31.5–35.7)
MCV: 91 fL (ref 79–97)
Platelets: 262 10*3/uL (ref 150–450)
RBC: 4.17 x10E6/uL (ref 3.77–5.28)
RDW: 13.6 % (ref 11.7–15.4)
WBC: 8 10*3/uL (ref 3.4–10.8)

## 2019-02-16 NOTE — Progress Notes (Signed)
Cardiology Office Note:    Date:  02/16/2019   ID:  [name], DOB Dec 08, 1950, MRN [id number]  PCP:  Everardo Beals, NP  Cardiologist:  Candee Furbish, MD  Electrophysiologist:  Thompson Grayer, MD   Referring MD: Everardo Beals, NP     History of Present Illness:    [name] is a 69 y.o. female here for the follow-up of ischemic cardiomyopathy, chronic systolic heart failure with prior ventricular tachycardia status post ICD, CRT was unsuccessful due to vein sclerosis, CABG in 2007, obstructive sleep apnea, diabetes with hypertension.  Back in September 2017 she was admitted with extensive bilateral lower extremity DVT.  Underwent thrombolysis with intravascular TPA with stenting to left common/left external iliac vein.  Xarelto for anticoagulation.  Device interrogation has periodically showed fluid accumulation..  Chronic lower extremity swelling.  No fevers chills nausea vomiting syncope.  02/16/2019 office visit-has seen [name] previously.  She underwent left heart catheterization on 01/19/2019:  Severe diffuse three-vessel coronary disease with functional total occlusion of the mid LAD, total occlusion of the large first diagonal, total occlusion of the proximal ramus intermedius, total occlusion of the proximal circumflex, and total occlusion of the mid RCA.  All vessels are heavily calcified.  Patent saphenous vein graft to the PDA.  Moderate diffuse disease beyond the graft insertion site  Patent sequential saphenous vein graft to the first and second obtuse marginal.  Diffuse native vessel disease beyond the bypass graft insertion site.  Patent saphenous vein graft to the ramus intermedius.  Diffuse native vessel disease beyond the graft insertion site.  Patent left internal mammary graft to the dominant diagonal.  Diffuse native vessel disease beyond the graft insertion site.  Chronic systolic heart failure with EF less than 35% and LVEDP 22  mmHg.  RECOMMENDATIONS:   Guideline directed therapy for systolic heart failure.   Diagnostic Dominance: Right  ECHO 01/13/19:   1. Suboptimal image quality for diagnosis of biventricular function and  wall motion.  2. Left ventricular ejection fraction, by visual estimation, is 50%. The  left ventricle has limited views for evaluation of function. Possible  apical wall motion abnormality. There is mildly increased left ventricular  hypertrophy.  3. Global right ventricle was not well visualized.The right ventricular  size is not well visualized. Right vetricular wall thickness was not  assessed.  4. Left atrial size was not well visualized.  5. Right atrial size was not well visualized.  6. Mild mitral annular calcification.  7. The mitral valve is abnormal. No evidence of mitral valve  regurgitation. No evidence of mitral stenosis.  8. The tricuspid valve is not well visualized.  9. The aortic valve was not well visualized. Aortic valve regurgitation  is not visualized. No evidence of aortic valve sclerosis or stenosis  She overall seems quite sluggish, tired.  Blood pressure was low today 90/50.  Daughter states that since she left the hospital she has been sluggish.  No chest pain.  No significant shortness of breath.  She is nauseous.  Does not wish to eat much.  Past Medical History:  Diagnosis Date  . AICD (automatic cardioverter/defibrillator) present   . Angina decubitus ([place]) 11/06/2012  . Angina pectoris ([place])   . Anxiety   . Arthritis   . Back pain   . Bundle branch block 05/2016  . CHF (congestive heart failure) ([place])   . Chronic combined systolic and diastolic CHF, NYHA class 3 ([place])   . Coronary artery disease  s/p CABG 2007  . Depression   . Dermal mycosis   . Diabetes mellitus    type II  . DVT (deep venous thrombosis) ([place]) 09/2015   bilateral  . Edema, lower extremity   . Glaucoma   . Hyperlipidemia   . Hypertension   . Insomnia    . Ischemic cardiomyopathy    EF 30-35%, s/p ICD 4/08 by Dr Leonia Reeves  . Joint pain   . Morbid obesity ([place]) 11/06/2012  . Obesity   . Oxygen dependent 2 L  . Pain in left knee   . Peripheral neuropathy   . 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 Sleep apnea    uses O2 at night  . SOB (shortness of breath)   . Tinea   . Ventricular tachycardia ([place]) 01/16/15   sustained VT terminated with ATP, CL 340 msec    Past Surgical History:  Procedure Laterality Date  . BI-VENTRICULAR IMPLANTABLE CARDIOVERTER DEFIBRILLATOR UPGRADE N/A 09/25/2012   Procedure: BI-VENTRICULAR IMPLANTABLE CARDIOVERTER DEFIBRILLATOR UPGRADE;  Surgeon: Coralyn , MD;  Location[facility] C[facility] LAB;  Service: Cardiovascular;  Laterality: N/A;  . BREAST BIOPSY Right 04/05/2014  . CARDIAC CATHETERIZATION    . CARDIAC DEFIBRILLATOR PLACEMENT  4/08   by Dr Leonia Reeves (MDT)  . COLONOSCOPY WITH PROPOFOL N/A 10/12/2016   Procedure: COLONOSCOPY WITH PROPOFOL;  Surgeon: Doran Stabler, MD;  Location: [facility] ENDOSCOPY;  Service: Gastroenterology;  Laterality: N/A;  . CORONARY ARTERY BYPASS GRAFT  10/05/2005   by Dr Cyndia Bent  . IMPLANTABLE CARDIOVERTER DEFIBRILLATOR IMPLANT  09/25/12   attempt of upgrade to CRT-D unsuccessful due to CS anatomy, SJM Unify Asaura device placed with LV port capped by Dr Rayann Heman  . IR GENERIC HISTORICAL  10/03/2015   IR US GUIDE VASC ACCESS LEFT 10/03/2015 Jacqulynn Cadet, MD [facility]  . IR GENERIC HISTORICAL  10/03/2015   IR VENO/EXT/UNI LEFT 10/03/2015 Jacqulynn Cadet, MD [facility]  . IR GENERIC HISTORICAL  10/03/2015   IR VENOCAVAGRAM IVC 10/03/2015 Jacqulynn Cadet, MD [facility]  . IR GENERIC HISTORICAL  10/03/2015   IR INFUSION THROMBOL VENOUS INITIAL (MS) 10/03/2015 Jacqulynn Cadet, MD [facility]  . IR GENERIC HISTORICAL  10/03/2015   IR US GUIDE VASC ACCESS LEFT 10/03/2015 Jacqulynn Cadet, MD  [facility]  . IR GENERIC HISTORICAL  10/04/2015   IR THROMBECT VENO MECH MOD SED 10/04/2015 Sandi Mariscal, MD [facility]  . IR GENERIC HISTORICAL  10/04/2015   IR TRANSCATH PLC STENT 1ST ART NOT LE CV CAR VERT CAR 10/04/2015 Sandi Mariscal, MD [facility]  . IR GENERIC HISTORICAL  10/04/2015   IR THROMB F/U EVAL ART/VEN FINAL DAY (MS) 10/04/2015 Sandi Mariscal, MD [facility]  . IR GENERIC HISTORICAL  11/02/2015   IR RADIOLOGIST EVAL & MGMT 11/02/2015 Sandi Mariscal, MD [facility]  . IR RADIOLOGIST EVAL & MGMT  04/26/2016  . LEFT HEART CATH AND CORS/GRAFTS ANGIOGRAPHY N/A 01/19/2019   Procedure: LEFT HEART CATH AND CORS/GRAFTS ANGIOGRAPHY;  Surgeon: Belva Crome, MD;  Location: [place] CV LAB;  Service: Cardiovascular;  Laterality: N/A;    Current Medications: Current Meds  Medication Sig  . albuterol (PROVENTIL HFA;VENTOLIN HFA) 108 (90 BASE) MCG/ACT inhaler Inhale 2 puffs into the lungs every 6 (six) hours as needed for wheezing or shortness of breath.  Marland Kitchen albuterol (PROVENTIL) (2.5 MG/3ML) 0.083% nebulizer solution  Take 2.5 mg by nebulization every 6 (six) hours as needed for wheezing or shortness of breath.   Marland Kitchen amitriptyline (ELAVIL) 10 MG tablet Take 10 mg by mouth daily.  . Capsaicin (MUSCLE RELIEF EX) Apply 1 application topically 3 (three) times daily as needed. For pain  . carvedilol (COREG) 12.5 MG tablet Take 12.5 mg by mouth 2 (two) times daily with a meal.   . ferrous sulfate 300 (60 Fe) MG/5ML syrup Take 300 mg by mouth daily with lunch.  . furosemide (LASIX) 80 MG tablet Take 0.5 tablets (40 mg total) by mouth daily.  Marland Kitchen glipiZIDE (GLUCOTROL) 5 MG tablet Take 5 mg by mouth daily before breakfast.   . isosorbide mononitrate (IMDUR) 30 MG 24 hr tablet TAKE 1 TABLET (30 MG TOTAL) BY MOUTH DAILY.  Marland Kitchen ketoconazole (NIZORAL) 2 % cream Apply 1 application topically 2 (two) times daily as needed (for skin irritation).   Marland Kitchen latanoprost (XALATAN) 0.005 % ophthalmic solution Place 1 drop  into both eyes at bedtime.  Marland Kitchen LEVEMIR FLEXTOUCH 100 UNIT/ML Pen Inject 15-20 Units into the skin at bedtime. Patient states she takes every other day in the pm.  Patient takes 15-20 units if glucose is greater than 140 per patient  . lisinopril (PRINIVIL,ZESTRIL) 10 MG tablet Take 10 mg by mouth daily.   . methocarbamol (ROBAXIN) 500 MG tablet Take 1 tablet (500 mg total) by mouth every 8 (eight) hours as needed for muscle spasms.  . nitroGLYCERIN (NITROSTAT) 0.4 MG SL tablet Place 0.4 mg under the tongue every 5 (five) minutes as needed for chest pain.  Marland Kitchen NOVOLOG FLEXPEN 100 UNIT/ML FlexPen Inject 30-50 Units into the skin See admin instructions. 50 units every morning, 25 units at lunch and 50 units at supper *Hold for low blood sugar*  . oxybutynin (DITROPAN-XL) 10 MG 24 hr tablet Take 10 mg by mouth daily.  . Potassium Chloride ER 20 MEQ TBCR Take 20 mEq by mouth daily.  . rosuvastatin (CRESTOR) 20 MG tablet Take 1 tablet (20 mg total) by mouth daily at 6 PM.  . Semaglutide (OZEMPIC, 0.25 OR 0.5 MG/DOSE, [place]) Inject 0.5 mg into the skin once a week.   . spironolactone (ALDACTONE) 25 MG tablet Take 25 mg by mouth daily.  Marland Kitchen tolterodine (DETROL LA) 4 MG 24 hr capsule Take 4 mg by mouth daily.  . traMADol (ULTRAM) 50 MG tablet Take 50 mg by mouth 2 (two) times daily.  . Vitamin D, Ergocalciferol, (DRISDOL) 1.25 MG (50000 UT) CAPS capsule Take 1 capsule (50,000 Units total) by mouth every 7 (seven) days.  Alveda Reasons 20 MG TABS tablet Take 20 mg by mouth daily.      Allergies:   Lipitor  [atorvastatin calcium], Metformin and related, Atorvastatin, and Levofloxacin   Social History   Socioeconomic History  . Marital status: Legally Separated    Spouse name: Not on file  . Number of children: 3  . Years of education: 44  . Highest education level: Not on file  Occupational History  . Occupation: retired    Fish farm manager: UNEMPLOYED  Tobacco Use  . Smoking status: Never Smoker  . Smokeless  tobacco: Never Used  Substance and Sexual Activity  . Alcohol use: No  . Drug use: No  . Sexual activity: Not Currently  Other Topics Concern  . Not on file  Social History Narrative   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   11/04/15 Lives with son  caffeine - coffee, 1-2 cups daily   Social Determinants of Health   Financial Resource Strain:   . Difficulty of Paying Living Expenses: Not on file  Food Insecurity:   . Worried About Charity fundraiser in the Last Year: Not on file  . Ran Out of Food in the Last Year: Not on file  Transportation Needs:   . Lack of Transportation (Medical): Not on file  . Lack of Transportation (Non-Medical): Not on file  Physical Activity:   . Days of Exercise per Week: Not on file  . Minutes of Exercise per Session: Not on file  Stress:   . Feeling of Stress : Not on file  Social Connections:   . Frequency of Communication with Friends and Family: Not on file  . Frequency of Social Gatherings with Friends and Family: Not on file  . Attends Religious Services: Not on file  . Active Member of Clubs or Organizations: Not on file  . Attends Archivist Meetings: Not on file  . Marital Status: Not on file     Family History: The patient's family history includes Diabetes (age of onset: 8) in her mother; High blood pressure in her mother; Obesity in her mother; Other in an other family member; Stroke in her mother; Sudden death in her mother.  ROS:   Please see the history of present illness.     All other systems reviewed and are negative.  EKGs/Labs/Other Studies Reviewed:    The following studies were reviewed today:  Echo 03/13/15 Mild to mod LVH, EF 40-45%, no RWMA, Gr 1 DD  Myoview 5/16 Findings consistent with prior myocardial infarction. This is a high risk study. The left ventricular ejection fraction is severely decreased (<30%). Defect 1: There is a large defect of severe severity  present in the mid inferoseptal and apex location.  [place] 12/11 EF 30-35% L-D2 patent S-RI patent S-OM1/OM2 patent S-PDA patent  LHC 1/21 Same as above   EKG:  01/31/2018 sinus rhythm noted, left bundle branch block appearance.  No significant change from prior.  Recent Labs: 01/17/2019: ALT 80 01/19/2019: B Natriuretic Peptide 533.7; Hemoglobin 10.2; Magnesium 2.3; Platelets 131 01/21/2019: BUN 12; Creatinine, Ser 1.08; Potassium 3.8; Sodium 136  Recent Lipid Panel    Component Value Date/Time   CHOL 111 11/03/2018 1205   CHOL 90 12/22/2012 1042   TRIG 87 11/03/2018 1205   TRIG 85 12/22/2012 1042   HDL 30 (L) 11/03/2018 1205   HDL 33 (L) 12/22/2012 1042   CHOLHDL 3.6 08/05/2017 1048   CHOLHDL 4.2 04/25/2012 0520   VLDL 23 04/25/2012 0520   LDLCALC 64 11/03/2018 1205   LDLCALC 40 12/22/2012 1042    Physical Exam:    VS:  BP (!) 90/50   Pulse (!) 104   Ht 5\' 3"  (1.6 m)   Wt 223 lb 12.8 oz (101.5 kg)   SpO2 98%   BMI 39.64 kg/m     Wt Readings from Last 3 Encounters:  02/16/19 223 lb 12.8 oz (101.5 kg)  01/18/19 261 lb 11 oz (118.7 kg)  12/30/18 229 lb (103.9 kg)     GEN: obese, wheelchair Well nourished, well developed in no acute distress HEENT: Normal NECK: No JVD; No carotid bruits LYMPHATICS: No lymphadenopathy CARDIAC: RRR, no murmurs, rubs, gallops RESPIRATORY:  Clear to auscultation without rales, wheezing or rhonchi  ABDOMEN: Soft, non-tender, non-distended MUSCULOSKELETAL:  No edema; No deformity  SKIN: Warm and dry NEUROLOGIC:  Alert and oriented  x 3 PSYCHIATRIC:  Normal affect   ASSESSMENT:    1. Chronic combined systolic and diastolic heart failure ([place])   2. ICD (implantable cardioverter-defibrillator) in place   3. Coronary artery disease involving native coronary artery of native heart without angina pectoris   4. History of DVT (deep vein thrombosis)   5. Ischemic cardiomyopathy   6. Fatigue, unspecified type   7. Long-term use of  high-risk medication    PLAN:    In order of problems listed above:  Chronic combined systolic and diastolic heart failure -Blood pressure is 90/50 this morning.  Quite low.  We are going to hold her lisinopril isosorbide spironolactone furosemide and potassium.  We will try to continue her carvedilol.  We will check a CBC complete metabolic profile and TSH.  She seems sluggish.  Her daughter states that ever since she left the hospital she has been slow.  I am concerned about her overall decline. -We will have her come back into the clinic on Thursday.  Her daughter may accompany her.  At that point we will decide whether or not we can resume some of her medications.  APP.  Coronary artery disease status post CABG 2007 with angina -Normally taking isosorbide.  We are going to hold because of low blood pressure.  Last catheterization showed medical management.  No new PCI.  See above.  Essential hypertension -Currently hypotensive.  Holding diuretics.  DVT -Xarelto.  Checking CBC today.  ICD -EP follow-up.  Doing well.  Stable.  Hyperlipidemia - On Crestor.   Medication Adjustments/Labs and Tests Ordered: Current medicines are reviewed at length with the patient today.  Concerns regarding medicines are outlined above.  Orders Placed This Encounter  Procedures  . CBC  . Comprehensive metabolic panel  . TSH   No orders of the defined types were placed in this encounter.   Patient Instructions  Medication Instructions:  PLEASE HOLD THE FOLLOWING MEDICATIONS UNTIL YOU HAVE BEEN SEEN IN THE OFFICE ON Thursday: Furosemide Isosorbide Lisinopril Spironolactone And potassium chloride. Continue all other medications as listed.  *If you need a refill on your cardiac medications before your next appointment, please call your pharmacy*  Lab Work: Please have blood work today (CBC, CMP and TSH). If you have labs (blood work) drawn today and your tests are completely normal, you  will receive your results only by: Marland Kitchen MyChart Message (if you have MyChart) OR . A paper copy in the mail If you have any lab test that is abnormal or we need to change your treatment, we will call you to review the results.  Follow-Up: Please return on Thursday for further evaluation per Dr Marlou Porch.  Thank you for choosing [facility]!!         Signed, Candee Furbish, MD  02/16/2019 12:03 PM    [place]

## 2019-02-16 NOTE — Patient Instructions (Signed)
Medication Instructions:  PLEASE HOLD THE FOLLOWING MEDICATIONS UNTIL YOU HAVE BEEN SEEN IN THE OFFICE ON Thursday: Furosemide Isosorbide Lisinopril Spironolactone And potassium chloride. Continue all other medications as listed.  *If you need a refill on your cardiac medications before your next appointment, please call your pharmacy*  Lab Work: Please have blood work today (CBC, CMP and TSH). If you have labs (blood work) drawn today and your tests are completely normal, you will receive your results only by: Marland Kitchen MyChart Message (if you have MyChart) OR . A paper copy in the mail If you have any lab test that is abnormal or we need to change your treatment, we will call you to review the results.  Follow-Up: Please return on Thursday for further evaluation per Dr Marlou Porch.  Thank you for choosing [facility]!!

## 2019-02-17 ENCOUNTER — Telehealth: Payer: Self-pay | Admitting: Cardiology

## 2019-02-17 NOTE — Telephone Encounter (Signed)
Called and spoke with daughter (on [place]) regarding lab results.  Dr Marlou Porch did call the pt last night and review results with her but they still have questions.  Specifically r/t her kidney function and him calling the elevation of her BUN/Crea "dry kidney"  Explained to daughter the labs indicate she is dehydrated and with her holding medications she was instructed to have yesterday and drinking water/sugar-free gatorade or PowerAid this will help rehydrate her.  Daughter states understanding and hanked me for the call.  Pt will f/u as scheduled Thursday.

## 2019-02-17 NOTE — Telephone Encounter (Signed)
Patient's daughter calling for clarification on lab results.

## 2019-02-18 NOTE — Progress Notes (Signed)
Cardiology Office Note:    Date:  02/19/2019   ID:  [name], DOB 1950/03/11, MRN [id number]  PCP:  Everardo Beals, NP  Cardiologist:  Candee Furbish, MD  Referring MD: Everardo Beals, NP   Chief Complaint  Patient presents with  . Follow-up    History of Present Illness:    [name] is a 69 y.o. female with a past medical history significant for ischemic cardiomyopathy, chronic systolic heart failure with prior ventricular tachycardia status post ICD, CRT was unsuccessful due to vein sclerosis, CABG in 2007, obstructive sleep apnea, diabetes with hypertension.  She underwent left heart cath 01/19/2019 that showed severe three-vessel CAD with patent LIMA and SVG X3, EF <35% and LVEDP 22 mmHg.   In 2017 the patient was admitted with extensive bilateral lower extremity DVT.  She underwent thrombolysis with intra vascular TPA with stenting to the left common/left external iliac vein.  She was placed on Xarelto for anticoagulation.  Device interrogation has periodically shown fluid accumulation.  She has chronic lower extremity swelling.  She was recently seen in the office on 02/16/2019 by Dr. Marlou Porch at which time she appeared to be sluggish.  The patient's daughter noted that she had been slow since she left the hospital last month.  There was concern for overall decline.  Her blood pressure was low at 90/50.  Labs indicated a rise in serum creatinine to 2.05 from previous 1.08.  Her medications including lisinopril, isosorbide, spironolactone, furosemide and potassium were put on hold.  Her carvedilol was continued.  She was advised to drink fluids and rehydrate.  She was not anemic and her TSH within normal range.  Her glucose was elevated at 327.  Patient and daughter are here today status post discharge from hospital.  Daughter states mother continues to be significantly weak and has no energy.  Daughter states mother can barely walk and unable to dress herself or perform  simple tasks.  Daughter states she has to dress her mother and sometimes even feed her mother.  Blood pressure is 98/54 today on arrival.  Patient is resting on the seat of her Rollator complaining of being significantly weak.   Cardiac studies   Left heart catheterization on 01/19/2019:  Severe diffuse three-vessel coronary disease with functional total occlusion of the mid LAD, total occlusion of the large first diagonal, total occlusion of the proximal ramus intermedius, total occlusion of the proximal circumflex, and total occlusion of the mid RCA. All vessels are heavily calcified.  Patent saphenous vein graft to the PDA. Moderate diffuse disease beyond the graft insertion site  Patent sequential saphenous vein graft to the first and second obtuse marginal. Diffuse native vessel disease beyond the bypass graft insertion site.  Patent saphenous vein graft to the ramus intermedius. Diffuse native vessel disease beyond the graft insertion site.  Patent left internal mammary graft to the dominant diagonal. Diffuse native vessel disease beyond the graft insertion site.  Chronic systolic heart failure with EF less than 35% and LVEDP 22 mmHg.  RECOMMENDATIONS:   Guideline directed therapy for systolic heart failure.   Diagnostic Dominance: Right  ECHO 01/13/19: 1. Suboptimal image quality for diagnosis of biventricular function and  wall motion.  2. Left ventricular ejection fraction, by visual estimation, is 50%. The  left ventricle has limited views for evaluation of function. Possible  apical wall motion abnormality. There is mildly increased left ventricular  hypertrophy.  3. Global right ventricle was not well visualized.The right ventricular  size is not well visualized. Right vetricular wall thickness was not  assessed.  4. Left atrial size was not well visualized.  5. Right atrial size was not well visualized.  6. Mild mitral annular calcification.  7. The  mitral valve is abnormal. No evidence of mitral valve  regurgitation. No evidence of mitral stenosis.  8. The tricuspid valve is not well visualized.  9. The aortic valve was not well visualized. Aortic valve regurgitation  is not visualized. No evidence of aortic valve sclerosis or stenosis  She overall seems quite sluggish, tired.  Blood pressure was low today 90/50.  Daughter states that since she left the hospital she has been sluggish.  No chest pain.  No significant shortness of breath.  She is nauseous.  Does not wish to eat much.  Echo 03/13/15 Mild to mod LVH, EF 40-45%, no RWMA, Gr 1 DD  Myoview 5/16 Findings consistent with prior myocardial infarction. This is a high risk study. The left ventricular ejection fraction is severely decreased (<30%). Defect 1: There is a large defect of severe severity present in the mid inferoseptal and apex location.  Continental 12/11 EF 30-35% L-D2 patent S-RI patent S-OM1/OM2 patent S-PDA patent   Past Medical History:  Diagnosis Date  . AICD (automatic cardioverter/defibrillator) present   . Angina decubitus ([place]) 11/06/2012  . Angina pectoris ([place])   . Anxiety   . Arthritis   . Back pain   . Bundle branch block 05/2016  . CHF (congestive heart failure) ([place])   . Chronic combined systolic and diastolic CHF, NYHA class 3 ([place])   . Coronary artery disease    s/p CABG 2007  . Depression   . Dermal mycosis   . Diabetes mellitus    type II  . DVT (deep venous thrombosis) ([place]) 09/2015   bilateral  . Edema, lower extremity   . Glaucoma   . Hyperlipidemia   . Hypertension   . Insomnia   . Ischemic cardiomyopathy    EF 30-35%, s/p ICD 4/08 by Dr Leonia Reeves  . Joint pain   . Morbid obesity ([place]) 11/06/2012  . Obesity   . Oxygen dependent 2 L  . Pain in left knee   . Peripheral neuropathy   . 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 Sleep  apnea    uses O2 at night  . SOB (shortness of breath)   . Tinea   . Ventricular tachycardia ([place]) 01/16/15   sustained VT terminated with ATP, CL 340 msec    Past Surgical History:  Procedure Laterality Date  . BI-VENTRICULAR IMPLANTABLE CARDIOVERTER DEFIBRILLATOR UPGRADE N/A 09/25/2012   Procedure: BI-VENTRICULAR IMPLANTABLE CARDIOVERTER DEFIBRILLATOR UPGRADE;  Surgeon: Coralyn Mark, MD;  Location: [facility] CATH LAB;  Service: Cardiovascular;  Laterality: N/A;  . BREAST BIOPSY Right 04/05/2014  . CARDIAC CATHETERIZATION    . CARDIAC DEFIBRILLATOR PLACEMENT  4/08   by Dr Leonia Reeves (MDT)  . COLONOSCOPY WITH PROPOFOL N/A 10/12/2016   Procedure: COLONOSCOPY WITH PROPOFOL;  Surgeon: Doran Stabler, MD;  Location: [facility];  Service: Gastroenterology;  Laterality: N/A;  . CORONARY ARTERY BYPASS GRAFT  10/05/2005   by Dr Cyndia Bent  . IMPLANTABLE CARDIOVERTER DEFIBRILLATOR IMPLANT  09/25/12   attempt of upgrade to CRT-D unsuccessful due to CS anatomy, SJM Unify Asaura device placed with LV port capped by Dr Rayann Heman  . IR GENERIC  HISTORICAL  10/03/2015   IR US GUIDE VASC ACCESS LEFT 10/03/2015 Jacqulynn Cadet, MD [facility]  . IR GENERIC HISTORICAL  10/03/2015   IR VENO/EXT/UNI LEFT 10/03/2015 Jacqulynn Cadet, MD [facility]  . IR GENERIC HISTORICAL  10/03/2015   IR VENOCAVAGRAM IVC 10/03/2015 Jacqulynn Cadet, MD [facility]  . IR GENERIC HISTORICAL  10/03/2015   IR INFUSION THROMBOL VENOUS INITIAL (MS) 10/03/2015 Jacqulynn Cadet, MD [facility]  . IR GENERIC HISTORICAL  10/03/2015   IR US GUIDE VASC ACCESS LEFT 10/03/2015 Jacqulynn Cadet, MD [facility]  . IR GENERIC HISTORICAL  10/04/2015   IR THROMBECT VENO MECH MOD SED 10/04/2015 Sandi Mariscal, MD [facility]  . IR GENERIC HISTORICAL  10/04/2015   IR TRANSCATH PLC STENT 1ST ART NOT LE CV CAR VERT CAR 10/04/2015 Sandi Mariscal, MD [facility]  . IR GENERIC HISTORICAL  10/04/2015   IR THROMB F/U EVAL ART/VEN FINAL DAY (MS) 10/04/2015 Sandi Mariscal, MD [facility]  . IR GENERIC HISTORICAL  11/02/2015   IR RADIOLOGIST EVAL & MGMT 11/02/2015 Sandi Mariscal, MD [facility]  . IR RADIOLOGIST EVAL & MGMT  04/26/2016  . LEFT HEART CATH AND CORS/GRAFTS ANGIOGRAPHY N/A 01/19/2019   Procedure: LEFT HEART CATH AND CORS/GRAFTS ANGIOGRAPHY;  Surgeon: Belva Crome, MD;  Location: [facility];  Service: Cardiovascular;  Laterality: N/A;    Current Medications: Current Meds  Medication Sig  . albuterol (PROVENTIL HFA;VENTOLIN HFA) 108 (90 BASE) MCG/ACT inhaler Inhale 2 puffs into the lungs every 6 (six) hours as needed for wheezing or shortness of breath.  Marland Kitchen albuterol (PROVENTIL) (2.5 MG/3ML) 0.083% nebulizer solution Take 2.5 mg by nebulization every 6 (six) hours as needed for wheezing or shortness of breath.   Marland Kitchen amitriptyline (ELAVIL) 10 MG tablet Take 10 mg by mouth daily.  . Capsaicin (MUSCLE RELIEF EX) Apply 1 application topically 3 (three) times daily as needed. For pain  . carvedilol (COREG) 12.5 MG tablet Take 12.5 mg by mouth 2 (two) times daily with a meal.   . ferrous sulfate 300 (60 Fe) MG/5ML syrup Take 300 mg by mouth daily with lunch.  Marland Kitchen glipiZIDE (GLUCOTROL) 5 MG tablet Take 5 mg by mouth daily before breakfast.   . isosorbide mononitrate (IMDUR) 30 MG 24 hr tablet TAKE 1 TABLET (30 MG TOTAL) BY MOUTH DAILY.  Marland Kitchen ketoconazole (NIZORAL) 2 % cream Apply 1 application topically 2 (two) times daily as needed (for skin irritation).   Marland Kitchen latanoprost (XALATAN) 0.005 % ophthalmic solution Place 1 drop into both eyes at bedtime.  Marland Kitchen LEVEMIR FLEXTOUCH 100 UNIT/ML Pen Inject 15-20 Units into the skin at bedtime. Patient states she takes every other day in the pm.  Patient takes 15-20 units if glucose is greater than 140 per patient  . lisinopril (PRINIVIL,ZESTRIL) 10 MG tablet Take 10 mg by mouth daily.   . methocarbamol (ROBAXIN) 500 MG tablet Take 1 tablet (500 mg total) by mouth every 8 (eight) hours as needed for muscle spasms.    . nitroGLYCERIN (NITROSTAT) 0.4 MG SL tablet Place 0.4 mg under the tongue every 5 (five) minutes as needed for chest pain.  Marland Kitchen NOVOLOG FLEXPEN 100 UNIT/ML FlexPen Inject 30-50 Units into the skin See admin instructions. 50 units every morning, 25 units at lunch and 50 units at supper *Hold for low blood sugar*  . oxybutynin (DITROPAN-XL) 10 MG 24 hr tablet Take 10 mg by mouth daily.  . Potassium Chloride ER 20 MEQ TBCR Take 20  mEq by mouth daily.  . rosuvastatin (CRESTOR) 20 MG tablet Take 1 tablet (20 mg total) by mouth daily at 6 PM.  . Semaglutide (OZEMPIC, 0.25 OR 0.5 MG/DOSE, [place]) Inject 0.5 mg into the skin once a week.   . tolterodine (DETROL LA) 4 MG 24 hr capsule Take 4 mg by mouth daily.  . traMADol (ULTRAM) 50 MG tablet Take 50 mg by mouth 2 (two) times daily.  . Vitamin D, Ergocalciferol, (DRISDOL) 1.25 MG (50000 UT) CAPS capsule Take 1 capsule (50,000 Units total) by mouth every 7 (seven) days.  Alveda Reasons 20 MG TABS tablet Take 20 mg by mouth daily.      Allergies:   Lipitor  [atorvastatin calcium], Metformin and related, Atorvastatin, and Levofloxacin   Social History   Socioeconomic History  . Marital status: Legally Separated    Spouse name: Not on file  . Number of children: 3  . Years of education: 22  . Highest education level: Not on file  Occupational History  . Occupation: retired    Fish farm manager: UNEMPLOYED  Tobacco Use  . Smoking status: Never Smoker  . Smokeless tobacco: Never Used  Substance and Sexual Activity  . Alcohol use: No  . Drug use: No  . Sexual activity: Not Currently  Other Topics Concern  . Not on file  Social History Narrative   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   11/04/15 Lives with son    caffeine - coffee, 1-2 cups daily   Social Determinants of Health   Financial Resource Strain:   . Difficulty of Paying Living Expenses: Not on file  Food Insecurity:   . Worried About Charity fundraiser in the Last  Year: Not on file  . Ran Out of Food in the Last Year: Not on file  Transportation Needs:   . Lack of Transportation (Medical): Not on file  . Lack of Transportation (Non-Medical): Not on file  Physical Activity:   . Days of Exercise per Week: Not on file  . Minutes of Exercise per Session: Not on file  Stress:   . Feeling of Stress : Not on file  Social Connections:   . Frequency of Communication with Friends and Family: Not on file  . Frequency of Social Gatherings with Friends and Family: Not on file  . Attends Religious Services: Not on file  . Active Member of Clubs or Organizations: Not on file  . Attends Archivist Meetings: Not on file  . Marital Status: Not on file     Family History: The patient'sfamily history includes Diabetes (age of onset: 71) in her mother; High blood pressure in her mother; Obesity in her mother; Other in an other family member; Stroke in her mother; Sudden death in her mother. ROS:   Please see the history of present illness.    All other systems reviewed and are negative.   EKG:  No EKG performed today  Recent Labs: 01/19/2019: B Natriuretic Peptide 533.7; Magnesium 2.3 02/16/2019: ALT 21; BUN 31; Creatinine, Ser 2.05; Hemoglobin 11.8; Platelets 262; Potassium 4.3; Sodium 137; TSH 1.380   Recent Lipid Panel    Component Value Date/Time   CHOL 111 11/03/2018 1205   CHOL 90 12/22/2012 1042   TRIG 87 11/03/2018 1205   TRIG 85 12/22/2012 1042   HDL 30 (L) 11/03/2018 1205   HDL 33 (L) 12/22/2012 1042   CHOLHDL 3.6 08/05/2017 1048   CHOLHDL 4.2 04/25/2012 0520   VLDL  23 04/25/2012 0520   LDLCALC 64 11/03/2018 1205   LDLCALC 40 12/22/2012 1042    Physical Exam:    VS:  BP (!) 98/54   Pulse 84   Ht 5\' 3"  (1.6 m)   Wt 224 lb (101.6 kg)   SpO2 98%   BMI 39.68 kg/m     Wt Readings from Last 6 Encounters:  02/19/19 224 lb (101.6 kg)  02/16/19 223 lb 12.8 oz (101.5 kg)  01/18/19 261 lb 11 oz (118.7 kg)  12/30/18 229 lb (103.9  kg)  12/09/18 234 lb (106.1 kg)  11/17/18 245 lb (111.1 kg)     Physical Exam  Constitutional: She is oriented to person, place, and time. She appears well-developed and well-nourished.  HENT:  Head: Normocephalic and atraumatic.  Cardiovascular: Normal rate, regular rhythm, normal heart sounds and intact distal pulses.  Pulmonary/Chest: Effort normal and breath sounds normal.  Musculoskeletal:        General: Edema present. Normal range of motion.     Cervical back: Normal range of motion and neck supple.     Comments: Wearing compression stockings  Neurological: She is alert and oriented to person, place, and time. She has normal reflexes.  Skin: Skin is warm and dry.  Psychiatric: She has a normal mood and affect. Her behavior is normal. Thought content normal.  Vitals reviewed.    ASSESSMENT:    1. AKI (acute kidney injury) ([place])   2. Hypotension, unspecified hypotension type   3. Type 2 diabetes mellitus without complication, with long-term current use of insulin ([place])   4. Generalized weakness    PLAN:    In order of problems listed above:  Chronic combined systolic and diastolic heart failure -Patient with recent functional decline and hypotension noted at office visit on 2/8.  Labs showed increase in serum creatinine to 2.05 from previous 1.08.  She was felt to be dehydrated.  Lisinopril, isosorbide, spironolactone, furosemide were put on hold.  The patient was advised to drink fluids.. -Advised daughter to make sure patient is not taking any diuretics or anti-hypertensive medications. Encouraged fluid intake. Patient appears dehydrated and is hypotensive. Check CMET today to assess renal function, electrolytes, and LFT's due to elevated liver enzymes and pancreatitis during hospital stay. Patient states she feels extremely weak and daughter states she has very little appetite.  CAD s/p CABG 2007 -Cardiac cath on 01/19/2019 showed patent grafts x4.  Medical management  recommended. Denies any progressive anginal symptoms.   Essential hypertension -Patient with recent hypotension and AKI.  Diuretics and antihypertensives were placed on hold. BP today remains low at 98/54. Advised daughter to push fluid intake for now. Hypotension likely result of osmotic diuresis from elevated BS and concomitant use of diuretics as well as UTI in hospital. Daughter states she has foul smelling urine. Advised daughter to contact PCP and  Have them re-check for recurrent UTI as well.  Diabetes Pt. has poorly controlled DM with recent BS 327 during recent check. She is likely having osmotic diuresis from elevated BS combined with recent UTI and concurrent diuretic use. Refer to endocrinology for management of Diabetes.  History of DVT -On Xarelto.  CBC without anemia.  ICD -Followed by EP  Hyperlipidemia -On Crestor  Medication Adjustments/Labs and Tests Ordered: Current medicines are reviewed at length with the patient today.  Concerns regarding medicines are outlined above. Labs and tests ordered and medication changes are outlined in the patient instructions below:  Patient Instructions  Medication Instructions:  Your physician recommends that you continue on your current medications as directed. Please refer to the Current Medication list given to you today.  *If you need a refill on your cardiac medications before your next appointment, please call your pharmacy*  Lab Work: Cmet- Today   If you have labs (blood work) drawn today and your tests are completely normal, you will receive your results only by: Marland Kitchen MyChart Message (if you have MyChart) OR . A paper copy in the mail If you have any lab test that is abnormal or we need to change your treatment, we will call you to review the results.  Testing/Procedures: Your physician has referred you to Endocrinology for "Diabetes"  Follow-Up: You are scheduled to see Candee Furbish, MD on 03/11/2019 @ 3:00 PM  Other  Instructions  Hypotension As your heart beats, it forces blood through your body. This force is called blood pressure. If you have hypotension, you have low blood pressure. When your blood pressure is too low, you may not get enough blood to your brain or other parts of your body. This may cause you to feel weak, light-headed, have a fast heartbeat, or even pass out (faint). Low blood pressure may be harmless, or it may cause serious problems. What are the causes?  Blood loss.  Not enough water in the body (dehydration).  Heart problems.  Hormone problems.  Pregnancy.  A very bad infection.  Not having enough of certain nutrients.  Very bad allergic reactions.  Certain medicines. What increases the risk?  Age. The risk increases as you get older.  Conditions that affect the heart or the brain and spinal cord (central nervous system).  Taking certain medicines.  Being pregnant. What are the signs or symptoms?  Feeling: ? Weak. ? Light-headed. ? Dizzy. ? Tired (fatigued).  Blurred vision.  Fast heartbeat.  Passing out, in very bad cases. How is this treated?  Changing your diet. This may involve eating more salt (sodium) or drinking more water.  Taking medicines to raise your blood pressure.  Changing how much you take (the dosage) of some of your medicines.  Wearing compression stockings. These stockings help to prevent blood clots and reduce swelling in your legs. In some cases, you may need to go to the hospital for:  Fluid replacement. This means you will receive fluids through an IV tube.  Blood replacement. This means you will receive donated blood through an IV tube (transfusion).  Treating an infection or heart problems, if this applies.  Monitoring. You may need to be monitored while medicines that you are taking wear off. Follow these instructions at home: Eating and drinking   Drink enough fluids to keep your pee (urine) pale yellow.  Eat  a healthy diet. Follow instructions from your doctor about what you can eat or drink. A healthy diet includes: ? Fresh fruits and vegetables. ? Whole grains. ? Low-fat (lean) meats. ? Low-fat dairy products.  Eat extra salt only as told. Do not add extra salt to your diet unless your doctor tells you to.  Eat small meals often.  Avoid standing up quickly after you eat. Medicines  Take over-the-counter and prescription medicines only as told by your doctor. ? Follow instructions from your doctor about changing how much you take of your medicines, if this applies. ? Do not stop or change any of your medicines on your own. General instructions   Wear compression stockings as told by your doctor.  Get up slowly from  lying down or sitting.  Avoid hot showers and a lot of heat as told by your doctor.  Return to your normal activities as told by your doctor. Ask what activities are safe for you.  Do not use any products that contain nicotine or tobacco, such as cigarettes, e-cigarettes, and chewing tobacco. If you need help quitting, ask your doctor.  Keep all follow-up visits as told by your doctor. This is important. Contact a doctor if:  You throw up (vomit).  You have watery poop (diarrhea).  You have a fever for more than 2-3 days.  You feel more thirsty than normal.  You feel weak and tired. Get help right away if:  You have chest pain.  You have a fast or uneven heartbeat.  You lose feeling (have numbness) in any part of your body.  You cannot move your arms or your legs.  You have trouble talking.  You get sweaty or feel light-headed.  You pass out.  You have trouble breathing.  You have trouble staying awake.  You feel mixed up (confused). Summary  Hypotension is also called low blood pressure. It is when the force of blood pumping through your arteries is too weak.  Hypotension may be harmless, or it may cause serious problems.  Treatment may  include changing your diet and medicines, and wearing compression stockings.  In very bad cases, you may need to go to the hospital. This information is not intended to replace advice given to you by your health care provider. Make sure you discuss any questions you have with your health care provider. Document Revised: 06/20/2017 Document Reviewed: 06/20/2017 Elsevier Patient Education  [place], Levell July NP 02/19/2019 3:58 PM    [facility]

## 2019-02-19 ENCOUNTER — Other Ambulatory Visit: Payer: Self-pay

## 2019-02-19 ENCOUNTER — Encounter: Payer: Self-pay | Admitting: Family Medicine

## 2019-02-19 ENCOUNTER — Ambulatory Visit (INDEPENDENT_AMBULATORY_CARE_PROVIDER_SITE_OTHER): Payer: Medicare HMO | Admitting: Family Medicine

## 2019-02-19 VITALS — BP 98/54 | HR 84 | Ht 63.0 in | Wt 224.0 lb

## 2019-02-19 DIAGNOSIS — E119 Type 2 diabetes mellitus without complications: Secondary | ICD-10-CM

## 2019-02-19 DIAGNOSIS — I959 Hypotension, unspecified: Secondary | ICD-10-CM

## 2019-02-19 DIAGNOSIS — N179 Acute kidney failure, unspecified: Secondary | ICD-10-CM

## 2019-02-19 DIAGNOSIS — Z794 Long term (current) use of insulin: Secondary | ICD-10-CM

## 2019-02-19 DIAGNOSIS — R531 Weakness: Secondary | ICD-10-CM | POA: Diagnosis not present

## 2019-02-19 NOTE — Patient Instructions (Addendum)
Medication Instructions:  Your physician recommends that you continue on your current medications as directed. Please refer to the Current Medication list given to you today.  *If you need a refill on your cardiac medications before your next appointment, please call your pharmacy*  Lab Work: [facility]- Today   If you have labs (blood work) drawn today and your tests are completely normal, you will receive your results only by: Marland Kitchen MyChart Message (if you have MyChart) OR . A paper copy in the mail If you have any lab test that is abnormal or we need to change your treatment, we will call you to review the results.  Testing/Procedures: Your physician has referred you to Endocrinology for "Diabetes"  Follow-Up: You are scheduled to see Candee Furbish, MD on 03/11/2019 @ 3:00 PM  Other Instructions  Hypotension As your heart beats, it forces blood through your body. This force is called blood pressure. If you have hypotension, you have low blood pressure. When your blood pressure is too low, you may not get enough blood to your brain or other parts of your body. This may cause you to feel weak, light-headed, have a fast heartbeat, or even pass out (faint). Low blood pressure may be harmless, or it may cause serious problems. What are the causes?  Blood loss.  Not enough water in the body (dehydration).  Heart problems.  Hormone problems.  Pregnancy.  A very bad infection.  Not having enough of certain nutrients.  Very bad allergic reactions.  Certain medicines. What increases the risk?  Age. The risk increases as you get older.  Conditions that affect the heart or the brain and spinal cord (central nervous system).  Taking certain medicines.  Being pregnant. What are the signs or symptoms?  Feeling: ? Weak. ? Light-headed. ? Dizzy. ? Tired (fatigued).  Blurred vision.  Fast heartbeat.  Passing out, in very bad cases. How is this treated?  Changing your diet. This  may involve eating more salt (sodium) or drinking more water.  Taking medicines to raise your blood pressure.  Changing how much you take (the dosage) of some of your medicines.  Wearing compression stockings. These stockings help to prevent blood clots and reduce swelling in your legs. In some cases, you may need to go to the hospital for:  Fluid replacement. This means you will receive fluids through an IV tube.  Blood replacement. This means you will receive donated blood through an IV tube (transfusion).  Treating an infection or heart problems, if this applies.  Monitoring. You may need to be monitored while medicines that you are taking wear off. Follow these instructions at home: Eating and drinking   Drink enough fluids to keep your pee (urine) pale yellow.  Eat a healthy diet. Follow instructions from your doctor about what you can eat or drink. A healthy diet includes: ? Fresh fruits and vegetables. ? Whole grains. ? Low-fat (lean) meats. ? Low-fat dairy products.  Eat extra salt only as told. Do not add extra salt to your diet unless your doctor tells you to.  Eat small meals often.  Avoid standing up quickly after you eat. Medicines  Take over-the-counter and prescription medicines only as told by your doctor. ? Follow instructions from your doctor about changing how much you take of your medicines, if this applies. ? Do not stop or change any of your medicines on your own. General instructions   Wear compression stockings as told by your doctor.  Get up  slowly from lying down or sitting.  Avoid hot showers and a lot of heat as told by your doctor.  Return to your normal activities as told by your doctor. Ask what activities are safe for you.  Do not use any products that contain nicotine or tobacco, such as cigarettes, e-cigarettes, and chewing tobacco. If you need help quitting, ask your doctor.  Keep all follow-up visits as told by your doctor. This  is important. Contact a doctor if:  You throw up (vomit).  You have watery poop (diarrhea).  You have a fever for more than 2-3 days.  You feel more thirsty than normal.  You feel weak and tired. Get help right away if:  You have chest pain.  You have a fast or uneven heartbeat.  You lose feeling (have numbness) in any part of your body.  You cannot move your arms or your legs.  You have trouble talking.  You get sweaty or feel light-headed.  You pass out.  You have trouble breathing.  You have trouble staying awake.  You feel mixed up (confused). Summary  Hypotension is also called low blood pressure. It is when the force of blood pumping through your arteries is too weak.  Hypotension may be harmless, or it may cause serious problems.  Treatment may include changing your diet and medicines, and wearing compression stockings.  In very bad cases, you may need to go to the hospital. This information is not intended to replace advice given to you by your health care provider. Make sure you discuss any questions you have with your health care provider. Document Revised: 06/20/2017 Document Reviewed: 06/20/2017 Elsevier Patient Education  [place].

## 2019-02-20 ENCOUNTER — Telehealth: Payer: Self-pay | Admitting: Medical

## 2019-02-20 ENCOUNTER — Encounter (HOSPITAL_COMMUNITY): Payer: Self-pay | Admitting: Emergency Medicine

## 2019-02-20 ENCOUNTER — Inpatient Hospital Stay (HOSPITAL_COMMUNITY)
Admission: EM | Admit: 2019-02-20 | Discharge: 2019-02-22 | DRG: 638 | Disposition: A | Discharge status: Home Health | Payer: Medicare Other | Source: Ambulatory Visit | Attending: Internal Medicine | Admitting: Internal Medicine

## 2019-02-20 ENCOUNTER — Other Ambulatory Visit: Payer: Self-pay

## 2019-02-20 DIAGNOSIS — E1122 Type 2 diabetes mellitus with diabetic chronic kidney disease: Secondary | ICD-10-CM | POA: Diagnosis not present

## 2019-02-20 DIAGNOSIS — I5042 Chronic combined systolic (congestive) and diastolic (congestive) heart failure: Secondary | ICD-10-CM | POA: Diagnosis not present

## 2019-02-20 DIAGNOSIS — Z20822 Contact with and (suspected) exposure to covid-19: Secondary | ICD-10-CM | POA: Diagnosis not present

## 2019-02-20 DIAGNOSIS — F329 Major depressive disorder, single episode, unspecified: Secondary | ICD-10-CM | POA: Diagnosis present

## 2019-02-20 DIAGNOSIS — E1010 Type 1 diabetes mellitus with ketoacidosis without coma: Secondary | ICD-10-CM

## 2019-02-20 DIAGNOSIS — Z833 Family history of diabetes mellitus: Secondary | ICD-10-CM

## 2019-02-20 DIAGNOSIS — Z888 Allergy status to other drugs, medicaments and biological substances status: Secondary | ICD-10-CM

## 2019-02-20 DIAGNOSIS — E1165 Type 2 diabetes mellitus with hyperglycemia: Principal | ICD-10-CM | POA: Diagnosis present

## 2019-02-20 DIAGNOSIS — Z881 Allergy status to other antibiotic agents status: Secondary | ICD-10-CM

## 2019-02-20 DIAGNOSIS — Z95810 Presence of automatic (implantable) cardiac defibrillator: Secondary | ICD-10-CM

## 2019-02-20 DIAGNOSIS — L89312 Pressure ulcer of right buttock, stage 2: Secondary | ICD-10-CM | POA: Diagnosis present

## 2019-02-20 DIAGNOSIS — N1832 Chronic kidney disease, stage 3b: Secondary | ICD-10-CM | POA: Diagnosis present

## 2019-02-20 DIAGNOSIS — Z6841 Body Mass Index (BMI) 40.0 and over, adult: Secondary | ICD-10-CM

## 2019-02-20 DIAGNOSIS — F419 Anxiety disorder, unspecified: Secondary | ICD-10-CM | POA: Diagnosis present

## 2019-02-20 DIAGNOSIS — E1142 Type 2 diabetes mellitus with diabetic polyneuropathy: Secondary | ICD-10-CM | POA: Diagnosis not present

## 2019-02-20 DIAGNOSIS — E559 Vitamin D deficiency, unspecified: Secondary | ICD-10-CM | POA: Diagnosis present

## 2019-02-20 DIAGNOSIS — B964 Proteus (mirabilis) (morganii) as the cause of diseases classified elsewhere: Secondary | ICD-10-CM | POA: Diagnosis present

## 2019-02-20 DIAGNOSIS — I255 Ischemic cardiomyopathy: Secondary | ICD-10-CM | POA: Diagnosis not present

## 2019-02-20 DIAGNOSIS — E785 Hyperlipidemia, unspecified: Secondary | ICD-10-CM | POA: Diagnosis present

## 2019-02-20 DIAGNOSIS — N390 Urinary tract infection, site not specified: Secondary | ICD-10-CM | POA: Diagnosis not present

## 2019-02-20 DIAGNOSIS — E860 Dehydration: Secondary | ICD-10-CM | POA: Diagnosis present

## 2019-02-20 DIAGNOSIS — I2510 Atherosclerotic heart disease of native coronary artery without angina pectoris: Secondary | ICD-10-CM | POA: Diagnosis not present

## 2019-02-20 DIAGNOSIS — L89152 Pressure ulcer of sacral region, stage 2: Secondary | ICD-10-CM | POA: Diagnosis present

## 2019-02-20 DIAGNOSIS — L89302 Pressure ulcer of unspecified buttock, stage 2: Secondary | ICD-10-CM

## 2019-02-20 DIAGNOSIS — G4733 Obstructive sleep apnea (adult) (pediatric): Secondary | ICD-10-CM | POA: Diagnosis present

## 2019-02-20 DIAGNOSIS — I130 Hypertensive heart and chronic kidney disease with heart failure and stage 1 through stage 4 chronic kidney disease, or unspecified chronic kidney disease: Secondary | ICD-10-CM | POA: Diagnosis present

## 2019-02-20 DIAGNOSIS — R627 Adult failure to thrive: Secondary | ICD-10-CM | POA: Diagnosis present

## 2019-02-20 DIAGNOSIS — Z951 Presence of aortocoronary bypass graft: Secondary | ICD-10-CM

## 2019-02-20 DIAGNOSIS — Z86718 Personal history of other venous thrombosis and embolism: Secondary | ICD-10-CM

## 2019-02-20 DIAGNOSIS — E538 Deficiency of other specified B group vitamins: Secondary | ICD-10-CM | POA: Diagnosis present

## 2019-02-20 DIAGNOSIS — I10 Essential (primary) hypertension: Secondary | ICD-10-CM | POA: Diagnosis present

## 2019-02-20 DIAGNOSIS — Z794 Long term (current) use of insulin: Secondary | ICD-10-CM

## 2019-02-20 DIAGNOSIS — Z7902 Long term (current) use of antithrombotics/antiplatelets: Secondary | ICD-10-CM

## 2019-02-20 DIAGNOSIS — Z8249 Family history of ischemic heart disease and other diseases of the circulatory system: Secondary | ICD-10-CM

## 2019-02-20 DIAGNOSIS — Z823 Family history of stroke: Secondary | ICD-10-CM

## 2019-02-20 DIAGNOSIS — Z9981 Dependence on supplemental oxygen: Secondary | ICD-10-CM

## 2019-02-20 DIAGNOSIS — L8990 Pressure ulcer of unspecified site, unspecified stage: Secondary | ICD-10-CM | POA: Diagnosis present

## 2019-02-20 DIAGNOSIS — IMO0002 Reserved for concepts with insufficient information to code with codable children: Secondary | ICD-10-CM | POA: Diagnosis present

## 2019-02-20 DIAGNOSIS — Z79899 Other long term (current) drug therapy: Secondary | ICD-10-CM

## 2019-02-20 DIAGNOSIS — L89322 Pressure ulcer of left buttock, stage 2: Secondary | ICD-10-CM | POA: Diagnosis not present

## 2019-02-20 LAB — CBC WITH DIFFERENTIAL/PLATELET
Abs Immature Granulocytes: 0.02 10*3/uL (ref 0.00–0.07)
Basophils Absolute: 0 10*3/uL (ref 0.0–0.1)
Basophils Relative: 0 %
Eosinophils Absolute: 0.1 10*3/uL (ref 0.0–0.5)
Eosinophils Relative: 1 %
HCT: 34.1 % — ABNORMAL LOW (ref 36.0–46.0)
Hemoglobin: 10.8 g/dL — ABNORMAL LOW (ref 12.0–15.0)
Immature Granulocytes: 0 %
Lymphocytes Relative: 19 %
Lymphs Abs: 1.3 10*3/uL (ref 0.7–4.0)
MCH: 29.9 pg (ref 26.0–34.0)
MCHC: 31.7 g/dL (ref 30.0–36.0)
MCV: 94.5 fL (ref 80.0–100.0)
Monocytes Absolute: 0.4 10*3/uL (ref 0.1–1.0)
Monocytes Relative: 7 %
Neutro Abs: 4.9 10*3/uL (ref 1.7–7.7)
Neutrophils Relative %: 73 %
Platelets: 169 10*3/uL (ref 150–400)
RBC: 3.61 MIL/uL — ABNORMAL LOW (ref 3.87–5.11)
RDW: 15.5 % (ref 11.5–15.5)
WBC: 6.7 10*3/uL (ref 4.0–10.5)
nRBC: 0 % (ref 0.0–0.2)

## 2019-02-20 LAB — CBG MONITORING, ED
Glucose-Capillary: 443 mg/dL — ABNORMAL HIGH (ref 70–99)
Glucose-Capillary: 355 mg/dL — ABNORMAL HIGH (ref 70–99)

## 2019-02-20 LAB — TROPONIN I (HIGH SENSITIVITY)
Troponin I (High Sensitivity): 15 ng/L (ref <18)
Troponin I (High Sensitivity): 16 ng/L (ref <18)

## 2019-02-20 LAB — URINALYSIS, ROUTINE W REFLEX MICROSCOPIC
Bilirubin Urine: NEGATIVE
Glucose, UA: >=500 mg/dL — AB
Hgb urine dipstick: NEGATIVE
Ketones, ur: 20 mg/dL — AB
Nitrite: NEGATIVE
Protein, ur: 100 mg/dL — AB
Specific Gravity, Urine: 1.023 (ref 1.005–1.030)
WBC, UA: >50 WBC/hpf — ABNORMAL HIGH (ref 0–5)
pH: 7 (ref 5.0–8.0)

## 2019-02-20 LAB — COMPREHENSIVE METABOLIC PANEL
ALT: 42 IU/L — ABNORMAL HIGH (ref 0–32)
AST: 47 IU/L — ABNORMAL HIGH (ref 0–40)
Albumin/Globulin Ratio: 0.7 — ABNORMAL LOW (ref 1.2–2.2)
Albumin: 3.2 g/dL — ABNORMAL LOW (ref 3.8–4.8)
Alkaline Phosphatase: 99 IU/L (ref 39–117)
BUN/Creatinine Ratio: 19 (ref 12–28)
BUN: 32 mg/dL — ABNORMAL HIGH (ref 8–27)
Bilirubin Total: 0.4 mg/dL (ref 0.0–1.2)
CO2: 23 mmol/L (ref 20–29)
Calcium: 9.7 mg/dL (ref 8.7–10.3)
Chloride: 89 mmol/L — ABNORMAL LOW (ref 96–106)
Creatinine, Ser: 1.7 mg/dL — ABNORMAL HIGH (ref 0.57–1.00)
GFR calc Af Amer: 35 mL/min/{1.73_m2} — ABNORMAL LOW (ref >59)
GFR calc non Af Amer: 31 mL/min/{1.73_m2} — ABNORMAL LOW (ref >59)
Globulin, Total: 4.4 g/dL (ref 1.5–4.5)
Glucose: 587 mg/dL (ref 65–99)
Potassium: 4.6 mmol/L (ref 3.5–5.2)
Sodium: 130 mmol/L — ABNORMAL LOW (ref 134–144)
Total Protein: 7.6 g/dL (ref 6.0–8.5)

## 2019-02-20 LAB — LACTIC ACID, PLASMA
Lactic Acid, Venous: 2.2 mmol/L (ref 0.5–1.9)
Lactic Acid, Venous: 2.7 mmol/L (ref 0.5–1.9)
Lactic Acid, Venous: 1.7 mmol/L (ref 0.5–1.9)
Lactic Acid, Venous: 2.1 mmol/L (ref 0.5–1.9)

## 2019-02-20 LAB — BASIC METABOLIC PANEL
Anion gap: 16 — ABNORMAL HIGH (ref 5–15)
BUN: 27 mg/dL — ABNORMAL HIGH (ref 8–23)
CO2: 23 mmol/L (ref 22–32)
Calcium: 9.7 mg/dL (ref 8.9–10.3)
Chloride: 91 mmol/L — ABNORMAL LOW (ref 98–111)
Creatinine, Ser: 1.52 mg/dL — ABNORMAL HIGH (ref 0.44–1.00)
GFR calc Af Amer: 40 mL/min — ABNORMAL LOW (ref >60)
GFR calc non Af Amer: 35 mL/min — ABNORMAL LOW (ref >60)
Glucose, Bld: 461 mg/dL — ABNORMAL HIGH (ref 70–99)
Potassium: 4.6 mmol/L (ref 3.5–5.1)
Sodium: 130 mmol/L — ABNORMAL LOW (ref 135–145)

## 2019-02-20 LAB — BETA-HYDROXYBUTYRIC ACID: Beta-Hydroxybutyric Acid: 2.42 mmol/L — ABNORMAL HIGH (ref 0.05–0.27)

## 2019-02-20 LAB — GLUCOSE, CAPILLARY
Glucose-Capillary: 234 mg/dL — ABNORMAL HIGH (ref 70–99)
Glucose-Capillary: 308 mg/dL — ABNORMAL HIGH (ref 70–99)
Glucose-Capillary: 237 mg/dL — ABNORMAL HIGH (ref 70–99)

## 2019-02-20 LAB — SARS CORONAVIRUS 2 (TAT 6-24 HRS): SARS Coronavirus 2: NEGATIVE

## 2019-02-20 MED ORDER — LACTATED RINGERS IV SOLN: INTRAVENOUS | Status: DC

## 2019-02-20 MED ORDER — DEXTROSE 50 % IV SOLN: 0.0000 mL | INTRAVENOUS | Status: DC | PRN

## 2019-02-20 MED ORDER — ONDANSETRON HCL 4 MG/2ML IJ SOLN: 4.0000 mg | Freq: Four times a day (QID) | INTRAMUSCULAR | Status: DC | PRN

## 2019-02-20 MED ORDER — RIVAROXABAN 20 MG PO TABS
20.0000 mg | ORAL_TABLET | Freq: Every day | ORAL | Status: DC
  Administered 2019-02-20 – 2019-02-22 (×3): 20 mg via ORAL
  Filled 2019-02-20 (×3): qty 1

## 2019-02-20 MED ORDER — ALBUTEROL SULFATE (2.5 MG/3ML) 0.083% IN NEBU: 2.5000 mg | INHALATION_SOLUTION | Freq: Four times a day (QID) | RESPIRATORY_TRACT | Status: DC | PRN

## 2019-02-20 MED ORDER — INSULIN DETEMIR 100 UNIT/ML ~~LOC~~ SOLN
15.0000 [IU] | Freq: Two times a day (BID) | SUBCUTANEOUS | Status: DC
  Administered 2019-02-20 – 2019-02-22 (×5): 15 [IU] via SUBCUTANEOUS
  Filled 2019-02-20 (×6): qty 0.15

## 2019-02-20 MED ORDER — LACTATED RINGERS IV BOLUS
1000.0000 mL | Freq: Once | INTRAVENOUS | Status: AC
  Administered 2019-02-20: 1000 mL via INTRAVENOUS

## 2019-02-20 MED ORDER — AMITRIPTYLINE HCL 10 MG PO TABS
10.0000 mg | ORAL_TABLET | Freq: Every day | ORAL | Status: DC
  Administered 2019-02-20 – 2019-02-21 (×2): 10 mg via ORAL
  Filled 2019-02-20 (×3): qty 1

## 2019-02-20 MED ORDER — INSULIN ASPART 100 UNIT/ML ~~LOC~~ SOLN
6.0000 [IU] | Freq: Three times a day (TID) | SUBCUTANEOUS | Status: DC
  Administered 2019-02-20 – 2019-02-22 (×7): 6 [IU] via SUBCUTANEOUS

## 2019-02-20 MED ORDER — ONDANSETRON HCL 4 MG PO TABS: 4.0000 mg | ORAL_TABLET | Freq: Four times a day (QID) | ORAL | Status: DC | PRN

## 2019-02-20 MED ORDER — DEXTROSE-NACL 5-0.45 % IV SOLN: INTRAVENOUS | Status: DC

## 2019-02-20 MED ORDER — SODIUM CHLORIDE 0.9 % IV SOLN: INTRAVENOUS | Status: DC

## 2019-02-20 MED ORDER — CARVEDILOL 6.25 MG PO TABS
6.2500 mg | ORAL_TABLET | Freq: Two times a day (BID) | ORAL | Status: DC
  Administered 2019-02-20 – 2019-02-22 (×5): 6.25 mg via ORAL
  Filled 2019-02-20 (×5): qty 1

## 2019-02-20 MED ORDER — METHOCARBAMOL 500 MG PO TABS
500.0000 mg | ORAL_TABLET | Freq: Two times a day (BID) | ORAL | Status: DC | PRN
  Administered 2019-02-21 (×2): 500 mg via ORAL
  Filled 2019-02-20 (×2): qty 1

## 2019-02-20 MED ORDER — SODIUM CHLORIDE 0.9 % IV SOLN
1.0000 g | Freq: Once | INTRAVENOUS | Status: AC
  Administered 2019-02-20: 14:00:00 1 g via INTRAVENOUS
  Filled 2019-02-20: qty 10

## 2019-02-20 MED ORDER — ACETAMINOPHEN 650 MG RE SUPP: 650.0000 mg | Freq: Four times a day (QID) | RECTAL | Status: DC | PRN

## 2019-02-20 MED ORDER — TRAMADOL HCL 50 MG PO TABS
50.0000 mg | ORAL_TABLET | Freq: Two times a day (BID) | ORAL | Status: DC
  Administered 2019-02-20 – 2019-02-22 (×4): 50 mg via ORAL
  Filled 2019-02-20 (×5): qty 1

## 2019-02-20 MED ORDER — INSULIN ASPART 100 UNIT/ML ~~LOC~~ SOLN
0.0000 [IU] | Freq: Three times a day (TID) | SUBCUTANEOUS | Status: DC
  Administered 2019-02-20: 15 [IU] via SUBCUTANEOUS
  Administered 2019-02-21: 7 [IU] via SUBCUTANEOUS
  Administered 2019-02-21 – 2019-02-22 (×2): 4 [IU] via SUBCUTANEOUS
  Administered 2019-02-22: 3 [IU] via SUBCUTANEOUS
  Administered 2019-02-22: 4 [IU] via SUBCUTANEOUS

## 2019-02-20 MED ORDER — GUAIFENESIN 100 MG/5ML PO SOLN: 5.0000 mL | ORAL | Status: DC | PRN

## 2019-02-20 MED ORDER — ROSUVASTATIN CALCIUM 20 MG PO TABS
20.0000 mg | ORAL_TABLET | Freq: Every day | ORAL | Status: DC
  Administered 2019-02-21 – 2019-02-22 (×2): 20 mg via ORAL
  Filled 2019-02-20 (×2): qty 1

## 2019-02-20 MED ORDER — SODIUM CHLORIDE 0.9% FLUSH
3.0000 mL | Freq: Two times a day (BID) | INTRAVENOUS | Status: DC
  Administered 2019-02-21 – 2019-02-22 (×2): 3 mL via INTRAVENOUS

## 2019-02-20 MED ORDER — INSULIN ASPART 100 UNIT/ML ~~LOC~~ SOLN
0.0000 [IU] | Freq: Every day | SUBCUTANEOUS | Status: DC
  Administered 2019-02-20: 2 [IU] via SUBCUTANEOUS

## 2019-02-20 MED ORDER — LATANOPROST 0.005 % OP SOLN
1.0000 [drp] | Freq: Every day | OPHTHALMIC | Status: DC
  Administered 2019-02-20 – 2019-02-21 (×2): 1 [drp] via OPHTHALMIC
  Filled 2019-02-20: qty 2.5

## 2019-02-20 MED ORDER — ACETAMINOPHEN 325 MG PO TABS
650.0000 mg | ORAL_TABLET | Freq: Four times a day (QID) | ORAL | Status: DC | PRN
  Administered 2019-02-21 (×2): 650 mg via ORAL
  Filled 2019-02-20 (×2): qty 2

## 2019-02-20 MED ORDER — DOCUSATE SODIUM 100 MG PO CAPS
100.0000 mg | ORAL_CAPSULE | Freq: Two times a day (BID) | ORAL | Status: DC
  Administered 2019-02-20 – 2019-02-22 (×5): 100 mg via ORAL
  Filled 2019-02-20 (×5): qty 1

## 2019-02-20 MED ORDER — INSULIN REGULAR(HUMAN) IN NACL 100-0.9 UT/100ML-% IV SOLN
INTRAVENOUS | Status: DC
  Administered 2019-02-20: 11:00:00 15 [IU]/h via INTRAVENOUS
  Filled 2019-02-20: qty 100

## 2019-02-20 NOTE — Progress Notes (Signed)
Notified MD Yates via secure chat that CBG was 234. Also that the pt needed CBG monitoring orders and that there was not any insulin ordered.   Paulla Fore, RN, BSN

## 2019-02-20 NOTE — Plan of Care (Signed)
  Problem: Education: Goal: Knowledge of General Education information will improve Description Including pain rating scale, medication(s)/side effects and non-pharmacologic comfort measures Outcome: Progressing   

## 2019-02-20 NOTE — Telephone Encounter (Signed)
   Notified by [facility] that patient had a critical lab value of BG 587 on labs yesterday. She took her insulin last night but has not taken her morning insulin. She has not checked her blood sugars since her visit yesterday. She has not eaten this morning. She checked her blood sugar while I was on the phone and reports it's 453. She has complaints of weakness and abdominal pain, though no nausea/vomiting. Given severely elevated blood sugars and recent symptoms of weakness and abdominal pain, suggested she present to the ED for evaluation of possible DKA.   Abigail Butts, PA-C 02/20/19; 7:54 AM

## 2019-02-20 NOTE — H&P (Signed)
History and Physical    [name] [id number] DOB: 04/03/1950 DOA: 02/20/2019  PCP: Everardo Beals, NP Consultants:  Marlou Porch - cardiology; Owens Shark - bariatrics Patient coming from:  Home - lives with son; NOK: Son or daughter, [name], [phone]  Chief Complaint: hypergylcemia  HPI: [name] is a 69 y.o. female with medical history significant of  OSA on CPAP; CAD s/p CABG; morbid obesity (BMI 40); HTN; HLD; DM; and chronic combined CHF with AICD in place presenting with hyperglycemia.  She reports that her blood sugar was high and her doctor sent her.  She misses her insulin "it look like a lot."  She feels light in her legs, hard to stand by herself.  No chest or abdominal pain.  No urinary symptoms.  She was last admitted from 1/4-13 with acute pancreatitis, planned for outpatient cholecystectomy; elevated troponin with cath showing multivessel disease, for med management; suspected DKA; and acute on chronic renal dysfunction.  ED Course:  Told to come in by cards.  Recent med changes.  Glucose 580 from labs yesterday.  Did not take insulin last night or today. Feels weak.  CO2 ok, small ketones, increased anion gap.  Started on insulin drip.  "Has a UTI" - added Rocephin.  Review of Systems: As per HPI; otherwise review of systems reviewed and negative.   Ambulatory Status:  Ambulates with a cane or walker  Past Medical History:  Diagnosis Date  . AICD (automatic cardioverter/defibrillator) present   . Anxiety   . Arthritis   . Back pain   . Bundle branch block 05/2016  . Chronic combined systolic and diastolic CHF, NYHA class 3 ([place])   . Depression   . Dermal mycosis   . Diabetes mellitus    type II  . DVT (deep venous thrombosis) ([place]) 09/2015   bilateral  . Edema, lower extremity   . Glaucoma   . Hyperlipidemia   . Hypertension   . Insomnia   . Ischemic cardiomyopathy    EF 30-35%, s/p ICD 4/08 by Dr Leonia Reeves  . Joint pain   . Morbid obesity  ([place]) 11/06/2012  . Obesity   . Oxygen dependent 2 L  . Pain in left knee   . Peripheral neuropathy   . 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 Sleep apnea    uses O2 at night  . Tinea   . Ventricular tachycardia ([place]) 01/16/15   sustained VT terminated with ATP, CL 340 msec    Past Surgical History:  Procedure Laterality Date  . BI-VENTRICULAR IMPLANTABLE CARDIOVERTER DEFIBRILLATOR UPGRADE N/A 09/25/2012   Procedure: BI-VENTRICULAR IMPLANTABLE CARDIOVERTER DEFIBRILLATOR UPGRADE;  Surgeon: Coralyn Mark, MD;  Location: [facility] CATH LAB;  Service: Cardiovascular;  Laterality: N/A;  . BREAST BIOPSY Right 04/05/2014  . CARDIAC CATHETERIZATION    . CARDIAC DEFIBRILLATOR PLACEMENT  4/08   by Dr Leonia Reeves (MDT)  . COLONOSCOPY WITH PROPOFOL N/A 10/12/2016   Procedure: COLONOSCOPY WITH PROPOFOL;  Surgeon: Doran Stabler, MD;  Location: [facility];  Service: Gastroenterology;  Laterality: N/A;  . CORONARY ARTERY BYPASS GRAFT  10/05/2005   by Dr Cyndia Bent  . IMPLANTABLE CARDIOVERTER DEFIBRILLATOR IMPLANT  09/25/12   attempt of upgrade to CRT-D unsuccessful due to CS anatomy, SJM Unify Asaura device placed with LV port capped by Dr Rayann Heman  . IR GENERIC HISTORICAL  10/03/2015  IR US GUIDE VASC ACCESS LEFT 10/03/2015 Jacqulynn Cadet, MD [facility]  . IR GENERIC HISTORICAL  10/03/2015   IR VENO/EXT/UNI LEFT 10/03/2015 Jacqulynn Cadet, MD [facility]  . IR GENERIC HISTORICAL  10/03/2015   IR VENOCAVAGRAM IVC 10/03/2015 Jacqulynn Cadet, MD [facility]  . IR GENERIC HISTORICAL  10/03/2015   IR INFUSION THROMBOL VENOUS INITIAL (MS) 10/03/2015 Jacqulynn Cadet, MD [facility]  . IR GENERIC HISTORICAL  10/03/2015   IR US GUIDE VASC ACCESS LEFT 10/03/2015 Jacqulynn Cadet, MD [facility]  . IR GENERIC HISTORICAL  10/04/2015   IR THROMBECT VENO MECH MOD SED 10/04/2015 Sandi Mariscal, MD [facility]  . IR GENERIC  HISTORICAL  10/04/2015   IR TRANSCATH PLC STENT 1ST ART NOT LE CV CAR VERT CAR 10/04/2015 Sandi Mariscal, MD [facility]  . IR GENERIC HISTORICAL  10/04/2015   IR THROMB F/U EVAL ART/VEN FINAL DAY (MS) 10/04/2015 Sandi Mariscal, MD [facility]  . IR GENERIC HISTORICAL  11/02/2015   IR RADIOLOGIST EVAL & MGMT 11/02/2015 Sandi Mariscal, MD [facility]  . IR RADIOLOGIST EVAL & MGMT  04/26/2016  . LEFT HEART CATH AND CORS/GRAFTS ANGIOGRAPHY N/A 01/19/2019   Procedure: LEFT HEART CATH AND CORS/GRAFTS ANGIOGRAPHY;  Surgeon: Belva Crome, MD;  Location: [facility];  Service: Cardiovascular;  Laterality: N/A;    Social History   Socioeconomic History  . Marital status: Legally Separated    Spouse name: Not on file  . Number of children: 3  . Years of education: 72  . Highest education level: Not on file  Occupational History  . Occupation: retired    Fish farm manager: UNEMPLOYED  Tobacco Use  . Smoking status: Never Smoker  . Smokeless tobacco: Never Used  Substance and Sexual Activity  . Alcohol use: No  . Drug use: No  . Sexual activity: Not Currently  Other Topics Concern  . Not on file  Social History Narrative   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   11/04/15 Lives with son    caffeine - coffee, 1-2 cups daily   Social Determinants of Health   Financial Resource Strain:   . Difficulty of Paying Living Expenses: Not on file  Food Insecurity:   . Worried About Charity fundraiser in the Last Year: Not on file  . Ran Out of Food in the Last Year: Not on file  Transportation Needs:   . Lack of Transportation (Medical): Not on file  . Lack of Transportation (Non-Medical): Not on file  Physical Activity:   . Days of Exercise per Week: Not on file  . Minutes of Exercise per Session: Not on file  Stress:   . Feeling of Stress : Not on file  Social Connections:   . Frequency of Communication with Friends and Family: Not on file  . Frequency of Social  Gatherings with Friends and Family: Not on file  . Attends Religious Services: Not on file  . Active Member of Clubs or Organizations: Not on file  . Attends Archivist Meetings: Not on file  . Marital Status: Not on file  Intimate Partner Violence:   . Fear of Current or Ex-Partner: Not on file  . Emotionally Abused: Not on file  . Physically Abused: Not on file  . Sexually Abused: Not on file    Allergies  Allergen Reactions  . Lipitor  [Atorvastatin Calcium] Rash  . Metformin And Related Itching, Swelling and Other (See Comments)  Leg pain & swelling in legs  . Atorvastatin Itching and Rash  . Levofloxacin Itching    Family History  Problem Relation Age of Onset  . Diabetes Mother 63       died - HTN  . Stroke Mother   . High blood pressure Mother   . Sudden death Mother   . Obesity Mother   . Other Other        No early family hx of CAD    Prior to Admission medications   Medication Sig Start Date End Date Taking? Authorizing Provider  albuterol (PROVENTIL HFA;VENTOLIN HFA) 108 (90 BASE) MCG/ACT inhaler Inhale 2 puffs into the lungs every 6 (six) hours as needed for wheezing or shortness of breath.   Yes [provider]  albuterol (PROVENTIL) (2.5 MG/3ML) 0.083% nebulizer solution Take 2.5 mg by nebulization every 6 (six) hours as needed for wheezing or shortness of breath.  09/30/15  Yes [provider]  amitriptyline (ELAVIL) 10 MG tablet Take 10 mg by mouth daily. 07/25/16  Yes [provider]  Capsaicin (MUSCLE RELIEF EX) Apply 1 application topically daily. For pain 08/09/16  Yes [provider]  carvedilol (COREG) 12.5 MG tablet Take 12.5 mg by mouth daily.  03/30/15  Yes [provider]  ferrous sulfate 325 (65 FE) MG EC tablet Take 325 mg by mouth daily.    Yes [provider]  glipiZIDE (GLUCOTROL) 5 MG tablet Take 5 mg by mouth daily before breakfast.    Yes [provider]  latanoprost  (XALATAN) 0.005 % ophthalmic solution Place 1 drop into both eyes at bedtime.   Yes [provider]  LEVEMIR FLEXTOUCH 100 UNIT/ML Pen Inject 15 Units into the skin 2 (two) times daily.  10/19/15  Yes [provider]  methocarbamol (ROBAXIN) 500 MG tablet Take 1 tablet (500 mg total) by mouth every 8 (eight) hours as needed for muscle spasms. Patient taking differently: Take 500 mg by mouth 2 (two) times daily as needed for muscle spasms.  05/30/14  Yes Barton Dubois, MD  nitroGLYCERIN (NITROSTAT) 0.4 MG SL tablet Place 0.4 mg under the tongue every 5 (five) minutes as needed for chest pain.   Yes [provider]  NOVOLOG FLEXPEN 100 UNIT/ML FlexPen Inject 20 Units into the skin 2 (two) times daily.  09/12/15  Yes [provider]  Potassium Chloride ER 20 MEQ TBCR Take 20 mEq by mouth daily. 07/04/16  Yes [provider]  rosuvastatin (CRESTOR) 20 MG tablet Take 1 tablet (20 mg total) by mouth daily at 6 PM. Patient taking differently: Take 20 mg by mouth daily.  10/07/15  Yes Minus Liberty, MD  Semaglutide (OZEMPIC, 0.25 OR 0.5 MG/DOSE, [place]) Inject 0.5 mg into the skin every Thursday.    Yes [provider]  traMADol (ULTRAM) 50 MG tablet Take 50 mg by mouth 2 (two) times daily. 11/28/15  Yes [provider]  Vitamin D, Ergocalciferol, (DRISDOL) 1.25 MG (50000 UT) CAPS capsule Take 1 capsule (50,000 Units total) by mouth every 7 (seven) days. Patient taking differently: Take 50,000 Units by mouth every Thursday.  12/30/18  Yes Jearld Lesch A, DO  XARELTO 20 MG TABS tablet Take 20 mg by mouth daily.  12/14/15  Yes [provider]  isosorbide mononitrate (IMDUR) 30 MG 24 hr tablet TAKE 1 TABLET (30 MG TOTAL) BY MOUTH DAILY. Patient not taking: Reported on 02/20/2019 12/31/18   Sharmon Revere    Physical Exam: Vitals:  02/20/19 1215 02/20/19 1245 02/20/19 1356 02/20/19 1356  BP: 115/61 (!) 110/55  116/62  Pulse: 84 80   79  Resp: (!) 25 (!) 22  18  Temp:    99.3 F (37.4 C)  TempSrc:    Oral  SpO2: 99% 98%  99%  Weight:   102.3 kg   Height:   5\' 3"  (1.6 m)      . General:  Appears calm and comfortable and is NAD . Eyes:  PERRL, EOMI, normal lids, iris . ENT:  grossly normal hearing, lips & tongue, mmm; edentulous . Neck:  no LAD, masses or thyromegaly . Cardiovascular:  RRR, no m/r/g. No LE edema.  Marland Kitchen Respiratory:   CTA bilaterally with no wheezes/rales/rhonchi.  Normal respiratory effort. . Abdomen:  soft, NT, ND, NABS . Skin:  no rash or induration seen on limited exam . Musculoskeletal:  grossly normal tone BUE/BLE, good ROM, no bony abnormality . Psychiatric:  grossly normal mood and affect, speech fluent and appropriate, AOx3 . Neurologic:  CN 2-12 grossly intact, moves all extremities in coordinated fashion    Radiological Exams on Admission: No results found.  EKG: Independently reviewed.  NSR with rate 81; IVCD; anterior ST depression noted that appears to be new  Labs on Admission: I have personally reviewed the available labs and imaging studies at the time of the admission.  Pertinent labs:   Na++ 130 Glucose 461 BUN 27/Creatinine 1.52/GFR 40 - stable HS troponin 15, 16 Lactate 2.2, 2.7 WBC 6.7 Hgb 10.8 Beta-hydroxybutyrate 2.42 UA: >500 glucose, 20 ketones, large LE, 100 protein, many bacteria, >50 WBC COVID negative   Assessment/Plan Principal Problem:   Uncontrolled diabetes mellitus (HCC) Active Problems:   CAD (coronary artery disease)   Hypertension   HLD (hyperlipidemia)   Morbid obesity (HCC)   Chronic combined systolic and diastolic heart failure (HCC)   Pressure injury of skin    Uncontrolled DM -Patient with long-standing uncontrolled DM - since 2011, she has never had an A1c better than 7.8 (that was in 09/2015) -She has progressive weakness -She reports frequently missing her insulin -Her glucose was 461 on presentation, which was better than 587  from yesterday's labs -She had minimally elevated anion gap and normal CO2 - not in DKA -Her glucose has mostly normalized -Continue basal insulin and add bolus and SSI -Hold Glucotrol ad Ozempic  Elevated lactate -Patient was dehydrated on admission as evidenced by ketonuria -She does not have SIRS criteria suggestive of sepsis -She could have a UTI (was given Rocephin x 1) and has a urine culture pending - but she denies UTI symptoms and her UA is more suggestive of dehydration in the setting of hyperglycemia -Will give a 1L bolus and further trend lactate for now  Chronic combined CHF -01/13/19 echo with EF roughly 50%, poor visualization due to body habitus -Will need to watch volume status, but currently appears to be dry  CAD -Recent cath with multivessel disease -Plan for ongoing medical management  HTN -Continue Coreg  HLD -Continue Crestor  Morbid obesity -BMI 40 -Weight loss should be encouraged -She was recommended to have home health during her last hospitalization due to deconditioning; will request repeat PT/OT consults  Pressure injury of skin -Nursing reports stage 2 buttocks and sacral pressure ulcers -Will request wound care consultation  Stage 3b CKD -Appears to be stable at this time  H/o DVT -Continue Xarelto   Note: This patient has been tested and is negative for the novel  coronavirus COVID-19.  DVT prophylaxis:  Xarelto Code Status:  Full - confirmed with patient Family Communication: None present; I spoke with the patient's daughter by telephone at the time of admission. Disposition Plan: She is anticipated to d/c to home with [facility] services once her hyperglycemia issues have been resolved. Consults called: PT/OT/Diabetes coordinator/wound care Admission status: It is my clinical opinion that referral for OBSERVATION is reasonable and necessary in this patient based on the above information provided. The aforementioned taken together are felt to  place the patient at high risk for further clinical deterioration. However it is anticipated that the patient may be medically stable for discharge from the hospital within 24 to 48 hours.    Karmen Bongo MD [facility]   How to contact the [facility] : [facility] Attending or Consulting provider [place] or covering provider during after hours [place], for this patient?  1. Check the care team in [facility] and look for a) attending/consulting [facility] provider listed and b) the [facility] team listed 2. Log into [email or web address] and use [place]'s universal password to access. If you do not have the password, please contact the hospital operator. 3. Locate the [facility] provider you are looking for under [facility] and page to a number that you can be directly reached. 4. If you still have difficulty reaching the provider, please page the [facility] (Director on Call) for the Hospitalists listed on amion for assistance.   02/20/2019, 5:05 PM

## 2019-02-20 NOTE — ED Triage Notes (Signed)
Patient sent by PCP for hyperglycemia and elevated kidney function. Reports CBG 453 this am. States she did not have her insulin last night.

## 2019-02-20 NOTE — ED Provider Notes (Signed)
[facility] EMERGENCY DEPARTMENT Provider Note   CSN: [id number] Arrival date & time: 02/20/19  [id number]     History No chief complaint on file.   [name] is a 69 y.o. female.  HPI   This patient is a very pleasant 69 year old female, she has a known history of ischemic cardiomyopathy with congestive heart failure, this is combined systolic and diastolic.  She has an implantable defibrillator, underwent heart cath in January 2021 that showed severe three-vessel disease with patent grafts, ejection fraction less than 35%.  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  At a recent visit she was noted to be suffering from some worsening kidney injury as her creatinine had risen up to 2.05 from previously being at 1, she had lisinopril Imdur spironolactone and and furosemide and potassium supplementations that were placed on hold at that time.  Glucose was noted to be elevated.  Blood pressure was low yesterday at 98/54, lab work was drawn, the patient appeared diffusely weak  She was seen in follow-up by the nurse practitioner at the cardiology office yesterday,  The patient was called today and told that her blood sugar was about 580 yesterday.  When she checked it today it was lower as she did take her insulin last night.  She was brought to the hospital today at the advisement of the cardiology team to be evaluated for diabetic ketoacidosis or other complications.  The patient states that she feels okay other than feeling generally weak, having a hard time getting in and out of bed because of weakness, denies focal weakness, denies chest pain or shortness of breath, she does endorse mild abdominal discomfort which comes and goes but no diarrhea rectal bleeding or dysuria.  She reports that she is able to pass urine and sometimes is urinating very frequently.   Her appetite's been okay, does not recall having any food this morning and now states that she actually did not take her insulin last night or this morning.  She had fish last night as well as well as fried okra  Past Medical History:  Diagnosis Date  . AICD (automatic cardioverter/defibrillator) present   . Anxiety   . Arthritis   . Back pain   . Bundle branch block 05/2016  . Chronic combined systolic and diastolic CHF, NYHA class 3 ([place])   . Depression   . Dermal mycosis   . Diabetes mellitus    type II  . DVT (deep venous thrombosis) ([place]) 09/2015   bilateral  . Edema, lower extremity   . Glaucoma   . Hyperlipidemia   . Hypertension   . Insomnia   . Ischemic cardiomyopathy    EF 30-35%, s/p ICD 4/08 by Dr Leonia Reeves  . Joint pain   . Morbid obesity ([place]) 11/06/2012  . Obesity   . Oxygen dependent 2 L  . Pain in left knee   . Peripheral neuropathy   . 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 Sleep apnea    uses O2 at night  . Tinea   . Ventricular tachycardia ([place]) 01/16/15   sustained VT terminated with ATP, CL 340 msec    Patient Active Problem List   Diagnosis Date Noted  . Demand ischemia (  HCC)   . Abnormal LFTs   . Acute pancreatitis 01/12/2019  . DKA (diabetic ketoacidoses) ([place]) 01/12/2019  . Vitamin D deficiency 11/05/2018  . Vitamin B 12 deficiency 11/05/2018  . Tinea   . Sleep apnea   . Peripheral neuropathy   . Oxygen dependent   . Obesity   . Insomnia   . Hyperlipidemia   . Dermal mycosis   . Depression   . Coronary artery disease   . CHF (congestive heart failure) ([place])   . Arthritis   . Anxiety   . AICD (automatic cardioverter/defibrillator) present   . Heme positive stool   . Benign neoplasm of transverse colon   . Bundle branch block 05/08/2016  . Left leg pain   . DVT (deep venous thrombosis) ([place]) 09/30/2015  . Syncope 03/11/2015  . AKI (acute  kidney injury) ([place]) 03/11/2015  . Hypotension 03/11/2015  . Syncope and collapse 03/11/2015  . Ventricular tachycardia ([place]) 01/17/2015  . Pain in the chest   . SOB (shortness of breath)   . Thyroid nodule   . CAP (community acquired pneumonia)   . Chest pain 05/26/2014  . Right flank pain 05/26/2014  . Right flank discomfort   . Chronic combined systolic and diastolic heart failure ([place])   . Angina pectoris ([place])   . Morbid obesity ([place]) 11/06/2012  . Angina decubitus ([place]) 11/06/2012  . LBBB (left bundle branch block) 09/24/2012  . Chronic systolic dysfunction of left ventricle 09/24/2012  . Hypoglycemia 09/09/2012  . HLD (hyperlipidemia) 04/23/2012  . Insulin dependent diabetes mellitus ([place]) 04/23/2012  . Acute renal failure ([place]) 03/10/2012  . Ischemic cardiomyopathy 06/18/2010  . CAD (coronary artery disease) 06/18/2010  . Hypertension 06/18/2010  . 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    Past Surgical History:  Procedure Laterality Date  . BI-VENTRICULAR IMPLANTABLE CARDIOVERTER DEFIBRILLATOR UPGRADE N/A 09/25/2012   Procedure: BI-VENTRICULAR IMPLANTABLE CARDIOVERTER DEFIBRILLATOR UPGRADE;  Surgeon: Coralyn Mark, MD;  Location: [facility] CATH LAB;  Service: Cardiovascular;  Laterality: N/A;  . BREAST BIOPSY Right 04/05/2014  . CARDIAC CATHETERIZATION    . CARDIAC DEFIBRILLATOR PLACEMENT  4/08   by Dr Leonia Reeves (MDT)  . COLONOSCOPY WITH PROPOFOL N/A 10/12/2016   Procedure: COLONOSCOPY WITH PROPOFOL;  Surgeon: Doran Stabler, MD;  Location: [facility];  Service: Gastroenterology;  Laterality: N/A;  . CORONARY ARTERY BYPASS GRAFT  10/05/2005   by Dr Cyndia Bent  . IMPLANTABLE CARDIOVERTER DEFIBRILLATOR IMPLANT  09/25/12   attempt of upgrade to CRT-D unsuccessful due to CS anatomy, SJM Unify Asaura device placed with LV port capped by Dr Rayann Heman  . IR GENERIC HISTORICAL  10/03/2015   IR US GUIDE VASC ACCESS LEFT 10/03/2015 Jacqulynn Cadet, MD [facility]  . IR GENERIC HISTORICAL  10/03/2015     IR VENO/EXT/UNI LEFT 10/03/2015 Jacqulynn Cadet, MD [facility]  . IR GENERIC HISTORICAL  10/03/2015   IR VENOCAVAGRAM IVC 10/03/2015 Jacqulynn Cadet, MD [facility]  . IR GENERIC HISTORICAL  10/03/2015   IR INFUSION THROMBOL VENOUS INITIAL (MS) 10/03/2015 Jacqulynn Cadet, MD [facility]  . IR GENERIC HISTORICAL  10/03/2015   IR US GUIDE VASC ACCESS LEFT 10/03/2015 Jacqulynn Cadet, MD [facility]  . IR GENERIC HISTORICAL  10/04/2015   IR THROMBECT VENO MECH MOD SED 10/04/2015 Sandi Mariscal, MD [facility]  . IR GENERIC HISTORICAL  10/04/2015   IR TRANSCATH PLC STENT 1ST ART NOT LE CV CAR VERT CAR 10/04/2015 Sandi Mariscal, MD [facility]  . IR GENERIC HISTORICAL  10/04/2015  IR THROMB F/U EVAL ART/VEN FINAL DAY (MS) 10/04/2015 Sandi Mariscal, MD [facility]  . IR GENERIC HISTORICAL  11/02/2015   IR RADIOLOGIST EVAL & MGMT 11/02/2015 Sandi Mariscal, MD [facility]  . IR RADIOLOGIST EVAL & MGMT  04/26/2016  . LEFT HEART CATH AND CORS/GRAFTS ANGIOGRAPHY N/A 01/19/2019   Procedure: LEFT HEART CATH AND CORS/GRAFTS ANGIOGRAPHY;  Surgeon: Belva Crome, MD;  Location: [facility] CV LAB;  Service: Cardiovascular;  Laterality: N/A;     OB History    Gravida  4   Para  4   Term      Preterm      AB      Living        SAB      TAB      Ectopic      Multiple      Live Births              Family History  Problem Relation Age of Onset  . Diabetes Mother 72       died - HTN  . Stroke Mother   . High blood pressure Mother   . Sudden death Mother   . Obesity Mother   . Other Other        No early family hx of CAD    Social History   Tobacco Use  . Smoking status: Never Smoker  . Smokeless tobacco: Never Used  Substance Use Topics  . Alcohol use: No  . Drug use: No    Home Medications Prior to Admission medications   Medication Sig Start Date End Date Taking? Authorizing Provider  albuterol (PROVENTIL HFA;VENTOLIN HFA) 108 (90 BASE) MCG/ACT inhaler Inhale 2  puffs into the lungs every 6 (six) hours as needed for wheezing or shortness of breath.   Yes [provider]  albuterol (PROVENTIL) (2.5 MG/3ML) 0.083% nebulizer solution Take 2.5 mg by nebulization every 6 (six) hours as needed for wheezing or shortness of breath.  09/30/15  Yes [provider]  amitriptyline (ELAVIL) 10 MG tablet Take 10 mg by mouth daily. 07/25/16  Yes [provider]  Capsaicin (MUSCLE RELIEF EX) Apply 1 application topically daily. For pain 08/09/16  Yes [provider]  carvedilol (COREG) 12.5 MG tablet Take 12.5 mg by mouth daily.  03/30/15  Yes [provider]  ferrous sulfate 325 (65 FE) MG EC tablet Take 325 mg by mouth daily.    Yes [provider]  glipiZIDE (GLUCOTROL) 5 MG tablet Take 5 mg by mouth daily before breakfast.    Yes [provider]  latanoprost (XALATAN) 0.005 % ophthalmic solution Place 1 drop into both eyes at bedtime.   Yes [provider]  LEVEMIR FLEXTOUCH 100 UNIT/ML Pen Inject 15 Units into the skin 2 (two) times daily.  10/19/15  Yes [provider]  methocarbamol (ROBAXIN) 500 MG tablet Take 1 tablet (500 mg total) by mouth every 8 (eight) hours as needed for muscle spasms. Patient taking differently: Take 500 mg by mouth 2 (two) times daily as needed for muscle spasms.  05/30/14  Yes Barton Dubois, MD  nitroGLYCERIN (NITROSTAT) 0.4 MG SL tablet Place 0.4 mg under the tongue every 5 (five) minutes as needed for chest pain.   Yes [provider]  NOVOLOG FLEXPEN 100 UNIT/ML FlexPen Inject 20 Units into the skin 2 (two) times daily.  09/12/15  Yes [provider]  Potassium Chloride ER 20 MEQ TBCR Take 20 mEq by  mouth daily. 07/04/16  Yes [provider]  rosuvastatin (CRESTOR) 20 MG tablet Take 1 tablet (20 mg total) by mouth daily at 6 PM. Patient taking differently: Take 20 mg by mouth daily.  10/07/15  Yes Minus Liberty, MD  Semaglutide  (OZEMPIC, 0.25 OR 0.5 MG/DOSE, [place]) Inject 0.5 mg into the skin every Thursday.    Yes [provider]  traMADol (ULTRAM) 50 MG tablet Take 50 mg by mouth 2 (two) times daily. 11/28/15  Yes [provider]  Vitamin D, Ergocalciferol, (DRISDOL) 1.25 MG (50000 UT) CAPS capsule Take 1 capsule (50,000 Units total) by mouth every 7 (seven) days. Patient taking differently: Take 50,000 Units by mouth every Thursday.  12/30/18  Yes Jearld Lesch A, DO  XARELTO 20 MG TABS tablet Take 20 mg by mouth daily.  12/14/15  Yes [provider]  isosorbide mononitrate (IMDUR) 30 MG 24 hr tablet TAKE 1 TABLET (30 MG TOTAL) BY MOUTH DAILY. Patient not taking: Reported on 02/20/2019 12/31/18   [name] T, PA-C    Allergies    Lipitor  [atorvastatin calcium], Metformin and related, Atorvastatin, and Levofloxacin  Review of Systems   Review of Systems  All other systems reviewed and are negative.   Physical Exam Updated Vital Signs BP 100/65   Pulse 93   Temp 98.5 F (36.9 C) (Oral)   Resp 18   SpO2 100%   Physical Exam Vitals and nursing note reviewed.  Constitutional:      General: She is not in acute distress.    Appearance: She is well-developed.  HENT:     Head: Normocephalic and atraumatic.     Mouth/Throat:     Pharynx: No oropharyngeal exudate.     Comments: Mucous membranes slightly dehydrated Eyes:     General: No scleral icterus.       Right eye: No discharge.        Left eye: No discharge.     Conjunctiva/sclera: Conjunctivae normal.     Pupils: Pupils are equal, round, and reactive to light.  Neck:     Thyroid: No thyromegaly.     Vascular: No JVD.  Cardiovascular:     Rate and Rhythm: Normal rate and regular rhythm.     Heart sounds: Normal heart sounds. No murmur. No friction rub. No gallop.      Comments: No obvious murmurs, pulses are palpable at the radial arteries, no JVD or significant peripheral edema Pulmonary:     Effort: Pulmonary effort  is normal. No respiratory distress.     Breath sounds: Normal breath sounds. No wheezing or rales.     Comments: Lungs are clear, the patient speaks in full sentences without distress Abdominal:     General: Bowel sounds are normal. There is no distension.     Palpations: Abdomen is soft. There is no mass.     Tenderness: There is abdominal tenderness.     Comments: Mild tenderness in the mid abdomen and epigastrium, no guarding or peritoneal signs, morbidly obese  Musculoskeletal:        General: No tenderness. Normal range of motion.     Cervical back: Normal range of motion and neck supple.  Lymphadenopathy:     Cervical: No cervical adenopathy.  Skin:    General: Skin is warm and dry.     Findings: No erythema or rash.  Neurological:     General: No focal deficit present.     Mental Status: She is alert.  Coordination: Coordination normal.     Comments: Patient is able to move all 4 extremities, she has normal strength, normal mental status, cranial nerves III through XII appear normal.  She is generally weak however  Psychiatric:        Behavior: Behavior normal.     ED Results / Procedures / Treatments   Labs (all labs ordered are listed, but only abnormal results are displayed) Labs Reviewed  BASIC METABOLIC PANEL - Abnormal; Notable for the following components:      Result Value   Sodium 130 (*)    Chloride 91 (*)    Glucose, Bld 461 (*)    BUN 27 (*)    Creatinine, Ser 1.52 (*)    GFR calc non Af Amer 35 (*)    GFR calc Af Amer 40 (*)    Anion gap 16 (*)    All other components within normal limits  CBC WITH DIFFERENTIAL/PLATELET - Abnormal; Notable for the following components:   RBC 3.61 (*)    Hemoglobin 10.8 (*)    HCT 34.1 (*)    All other components within normal limits  URINALYSIS, ROUTINE W REFLEX MICROSCOPIC - Abnormal; Notable for the following components:   APPearance HAZY (*)    Glucose, UA >=500 (*)    Ketones, ur 20 (*)    Protein, ur 100  (*)    Leukocytes,Ua LARGE (*)    WBC, UA >50 (*)    Bacteria, UA MANY (*)    All other components within normal limits  BETA-HYDROXYBUTYRIC ACID - Abnormal; Notable for the following components:   Beta-Hydroxybutyric Acid 2.42 (*)    All other components within normal limits  LACTIC ACID, PLASMA - Abnormal; Notable for the following components:   Lactic Acid, Venous 2.2 (*)    All other components within normal limits  CBG MONITORING, ED - Abnormal; Notable for the following components:   Glucose-Capillary 443 (*)    All other components within normal limits  CBG MONITORING, ED - Abnormal; Notable for the following components:   Glucose-Capillary 355 (*)    All other components within normal limits  URINE CULTURE  LACTIC ACID, PLASMA  TROPONIN I (HIGH SENSITIVITY)    EKG EKG Interpretation  Date/Time:  Friday February 20 2019 10:25:44 EST Ventricular Rate:  81 PR Interval:    QRS Duration: 147 QT Interval:  434 QTC Calculation: 504 R Axis:   -12 Text Interpretation: Sinus rhythm Nonspecific intraventricular conduction delay Consider anterior infarct Abnormal T, consider ischemia, lateral leads similar abnormalities to prior - rate faster than prior Confirmed by Noemi Chapel [phone]) on 02/20/2019 12:22:32 PM   Radiology No results found.  Procedures .Critical Care Performed by: Noemi Chapel, MD Authorized by: Noemi Chapel, MD   Critical care provider statement:    Critical care time (minutes):  35   Critical care time was exclusive of:  Separately billable procedures and treating other patients and teaching time   Critical care was necessary to treat or prevent imminent or life-threatening deterioration of the following conditions:  Endocrine crisis   Critical care was time spent personally by me on the following activities:  Blood draw for specimens, development of treatment plan with patient or surrogate, discussions with consultants, evaluation of patient's  response to treatment, examination of patient, obtaining history from patient or surrogate, ordering and performing treatments and interventions, ordering and review of laboratory studies, ordering and review of radiographic studies, pulse oximetry, re-evaluation of patient's condition and review of  old charts Comments:         (including critical care time)  Medications Ordered in ED Medications  insulin regular, human (MYXREDLIN) 100 units/ 100 mL infusion (15 Units/hr Intravenous New Bag/Given 02/20/19 1052)  0.9 %  sodium chloride infusion ( Intravenous New Bag/Given 02/20/19 1049)  dextrose 5 %-0.45 % sodium chloride infusion (has no administration in time range)  dextrose 50 % solution 0-50 mL (has no administration in time range)  cefTRIAXone (ROCEPHIN) 1 g in sodium chloride 0.9 % 100 mL IVPB (has no administration in time range)    ED Course  I have reviewed the triage vital signs and the nursing notes.  Pertinent labs & imaging results that were available during my care of the patient were reviewed by me and considered in my medical decision making (see chart for details).    MDM Rules/Calculators/A&P                      The patient was sent over to evaluate for DKA or other complications.  Her blood pressure is 100/65 with a heart rate of 93.  She is generally weak.  She had multiple medications stopped this week which could be contributing to this however her hyperglycemia is likely related to lack of taking medications appropriately.  She readily states that she forgets to take her insulin like she did last night.  She is willing to undergo the work-up with urinalysis labs and x-ray.  I have personally viewed and interpreted the labs, the patient does have evidence of urinary tract infection, I have given her antibiotics and discussed with the hospitalist.  She has a glucose of 443 with ketones in the urine and in the serum.  She is currently on an insulin drip, appears  critically ill for mild DKA with urinary tract infection and borderline hypotension, discussed with hospitalist Dr. Lorin Mercy who has been kind enough to admit  [name] was evaluated in Emergency Department on 02/20/2019 for the symptoms described in the history of present illness. She was evaluated in the context of the global COVID-19 pandemic, which necessitated consideration that the patient might be at risk for infection with the SARS-CoV-2 virus that causes COVID-19. Institutional protocols and algorithms that pertain to the evaluation of patients at risk for COVID-19 are in a state of rapid change based on information released by regulatory bodies including the CDC and federal and state organizations. These policies and algorithms were followed during the patient's care in the ED.   Final Clinical Impression(s) / ED Diagnoses Final diagnoses:  Diabetic ketoacidosis without coma associated with type 1 diabetes mellitus ([place])  Urinary tract infection without hematuria, site unspecified      Noemi Chapel, MD 02/20/19 1222

## 2019-02-20 NOTE — Progress Notes (Signed)
Inpatient Diabetes Program Recommendations  AACE/ADA: New Consensus Statement on Inpatient Glycemic Control (2015)  Target Ranges:  Prepandial:   less than 140 mg/dL      Peak postprandial:   less than 180 mg/dL (1-2 hours)      Critically ill patients:  140 - 180 mg/dL   Lab Results  Component Value Date   GLUCAP 234 (H) 02/20/2019   HGBA1C 8.3 (H) 01/13/2019    Review of Glycemic Control Results for CLODA, ZUNINO (MRN [id number]) as of 02/20/2019 14:48  Ref. Range 02/20/2019 10:24 02/20/2019 12:12 02/20/2019 13:55  Glucose-Capillary Latest Ref Range: 70 - 99 mg/dL 443 (H) 355 (H) 234 (H)    Diabetes history: DM2 Outpatient Diabetes medications:  Levemir 15 units BID + Novolog 20 units BID + Glipizide 5 mg QD + Ozempic 0.5 Q Th Current orders for Inpatient glycemic control:  Novolog 0-20 TID + 0-5 QHS + Novolog [facility] TID 6 units + Levemir 15 units BID  Note:  Spoke with patient on the phone.  She states she forgot to take her insulin last night and today it was >500.  She denies difficulties obtaining medications and states she normally takes as prescribed.  She is current with her PCP.  Last saw PCP 2 weeks ago.  Checks her CBG's TID.  Has been drinking Pedialite.  Explained that this has sugar in it.  Reviewed hypoglycemia and treatment.  Encouraged her to call PCP if CBG's are consistently >200 or 100mg /dl.    Thank you, Reche Dixon, RN, BSN Diabetes Coordinator Inpatient Diabetes Program [phone] (team pager from 8a-5p)

## 2019-02-20 NOTE — Progress Notes (Signed)
NEW ADMISSION NOTE New Admission Note:   Arrival Method: stretcher from ED Mental Orientation: axox4 Telemetry:box 5M18  Assessment: Completed Skin: see skin assessment  [place]:[id number] hand  Pain: denies  Tubes: Safety Measures: Safety Fall Prevention Plan has been discussed  Admission: Completed [facility] [place] Orientation: Patient has been orientated to the room, unit and staff.  Family: none at the bedside   Orders have been reviewed and implemented. Will continue to monitor the patient. Call light has been placed within reach and bed alarm has been activated.   Paulla Fore, RN

## 2019-02-21 DIAGNOSIS — N390 Urinary tract infection, site not specified: Secondary | ICD-10-CM | POA: Diagnosis not present

## 2019-02-21 DIAGNOSIS — F419 Anxiety disorder, unspecified: Secondary | ICD-10-CM | POA: Diagnosis not present

## 2019-02-21 DIAGNOSIS — I2510 Atherosclerotic heart disease of native coronary artery without angina pectoris: Secondary | ICD-10-CM | POA: Diagnosis not present

## 2019-02-21 DIAGNOSIS — Z6841 Body Mass Index (BMI) 40.0 and over, adult: Secondary | ICD-10-CM | POA: Diagnosis not present

## 2019-02-21 DIAGNOSIS — E559 Vitamin D deficiency, unspecified: Secondary | ICD-10-CM | POA: Diagnosis present

## 2019-02-21 DIAGNOSIS — E538 Deficiency of other specified B group vitamins: Secondary | ICD-10-CM | POA: Diagnosis present

## 2019-02-21 DIAGNOSIS — E785 Hyperlipidemia, unspecified: Secondary | ICD-10-CM | POA: Diagnosis not present

## 2019-02-21 DIAGNOSIS — Z9981 Dependence on supplemental oxygen: Secondary | ICD-10-CM | POA: Diagnosis not present

## 2019-02-21 DIAGNOSIS — I130 Hypertensive heart and chronic kidney disease with heart failure and stage 1 through stage 4 chronic kidney disease, or unspecified chronic kidney disease: Secondary | ICD-10-CM | POA: Diagnosis not present

## 2019-02-21 DIAGNOSIS — I5042 Chronic combined systolic (congestive) and diastolic (congestive) heart failure: Secondary | ICD-10-CM | POA: Diagnosis present

## 2019-02-21 DIAGNOSIS — L89322 Pressure ulcer of left buttock, stage 2: Secondary | ICD-10-CM | POA: Diagnosis not present

## 2019-02-21 DIAGNOSIS — E1122 Type 2 diabetes mellitus with diabetic chronic kidney disease: Secondary | ICD-10-CM | POA: Diagnosis not present

## 2019-02-21 DIAGNOSIS — L89302 Pressure ulcer of unspecified buttock, stage 2: Secondary | ICD-10-CM | POA: Diagnosis not present

## 2019-02-21 DIAGNOSIS — E1165 Type 2 diabetes mellitus with hyperglycemia: Secondary | ICD-10-CM | POA: Diagnosis present

## 2019-02-21 DIAGNOSIS — E860 Dehydration: Secondary | ICD-10-CM | POA: Diagnosis present

## 2019-02-21 DIAGNOSIS — R627 Adult failure to thrive: Secondary | ICD-10-CM | POA: Diagnosis present

## 2019-02-21 DIAGNOSIS — I255 Ischemic cardiomyopathy: Secondary | ICD-10-CM | POA: Diagnosis present

## 2019-02-21 DIAGNOSIS — B964 Proteus (mirabilis) (morganii) as the cause of diseases classified elsewhere: Secondary | ICD-10-CM | POA: Diagnosis present

## 2019-02-21 DIAGNOSIS — E1142 Type 2 diabetes mellitus with diabetic polyneuropathy: Secondary | ICD-10-CM | POA: Diagnosis present

## 2019-02-21 DIAGNOSIS — Z20822 Contact with and (suspected) exposure to covid-19: Secondary | ICD-10-CM | POA: Diagnosis not present

## 2019-02-21 DIAGNOSIS — N1832 Chronic kidney disease, stage 3b: Secondary | ICD-10-CM | POA: Diagnosis not present

## 2019-02-21 DIAGNOSIS — L89152 Pressure ulcer of sacral region, stage 2: Secondary | ICD-10-CM | POA: Diagnosis present

## 2019-02-21 DIAGNOSIS — G4733 Obstructive sleep apnea (adult) (pediatric): Secondary | ICD-10-CM | POA: Diagnosis present

## 2019-02-21 DIAGNOSIS — L89312 Pressure ulcer of right buttock, stage 2: Secondary | ICD-10-CM | POA: Diagnosis present

## 2019-02-21 LAB — CBC
HCT: 30.6 % — ABNORMAL LOW (ref 36.0–46.0)
Hemoglobin: 9.9 g/dL — ABNORMAL LOW (ref 12.0–15.0)
MCH: 29.5 pg (ref 26.0–34.0)
MCHC: 32.4 g/dL (ref 30.0–36.0)
MCV: 91.1 fL (ref 80.0–100.0)
Platelets: 177 10*3/uL (ref 150–400)
RBC: 3.36 MIL/uL — ABNORMAL LOW (ref 3.87–5.11)
RDW: 15.4 % (ref 11.5–15.5)
WBC: 5.5 10*3/uL (ref 4.0–10.5)
nRBC: 0 % (ref 0.0–0.2)

## 2019-02-21 LAB — BASIC METABOLIC PANEL
Anion gap: 14 (ref 5–15)
BUN: 17 mg/dL (ref 8–23)
CO2: 24 mmol/L (ref 22–32)
Calcium: 9.4 mg/dL (ref 8.9–10.3)
Chloride: 95 mmol/L — ABNORMAL LOW (ref 98–111)
Creatinine, Ser: 1.08 mg/dL — ABNORMAL HIGH (ref 0.44–1.00)
GFR calc Af Amer: >60 mL/min (ref >60)
GFR calc non Af Amer: 53 mL/min — ABNORMAL LOW (ref >60)
Glucose, Bld: 174 mg/dL — ABNORMAL HIGH (ref 70–99)
Potassium: 4 mmol/L (ref 3.5–5.1)
Sodium: 133 mmol/L — ABNORMAL LOW (ref 135–145)

## 2019-02-21 LAB — GLUCOSE, CAPILLARY
Glucose-Capillary: 172 mg/dL — ABNORMAL HIGH (ref 70–99)
Glucose-Capillary: 202 mg/dL — ABNORMAL HIGH (ref 70–99)
Glucose-Capillary: 119 mg/dL — ABNORMAL HIGH (ref 70–99)
Glucose-Capillary: 147 mg/dL — ABNORMAL HIGH (ref 70–99)

## 2019-02-21 MED ORDER — MUSCLE RUB 10-15 % EX CREA
TOPICAL_CREAM | CUTANEOUS | Status: DC | PRN
  Administered 2019-02-21: 1 via TOPICAL
  Filled 2019-02-21: qty 85

## 2019-02-21 NOTE — Evaluation (Signed)
Physical Therapy Evaluation Patient Details Name: [name] MRN: [id number] DOB: 30-Nov-1950 Today's Date: 02/21/2019   History of Present Illness  Patient is a 69 year old female admitted for hyperglycemia. Her blood sugar has decreased since admission  PMH:  CHF/ICM, home 02, CAD, CABG, ICD, DM, HTN, OSA, and DVT  Clinical Impression  Patient required mod a for bed mobility and min a for transfers. At baseline she reports she was able to transfer on her own. She has an aide at home and her daughter. If her daughter is comfortable with transferring her then she may be able to go home. She is a high fall risk at this time. She may benefit most from rehab to improve her strength and decrease her fall risk. Acute therapy will continue to follow her while she is admitted.     Follow Up Recommendations SNF;Home health PT depending on level of care that can be provided.     Equipment Recommendations  None recommended by PT    Recommendations for Other Services Rehab consult     Precautions / Restrictions Precautions Precautions: Fall Restrictions Weight Bearing Restrictions: No      Mobility  Bed Mobility Overal bed mobility: Needs Assistance Bed Mobility: Supine to Sit     Supine to sit: Mod assist     General bed mobility comments: mod a to sit up and mod a to scoot to the edge of the bed.  Transfers Overall transfer level: Needs assistance Equipment used: Rolling walker (2 wheeled) Transfers: Sit to/from Stand Sit to Stand: Min assist         General transfer comment: sit to stand from bed 2x with therapy. On both trial she required min a for strength and balance   Ambulation/Gait Ambulation/Gait assistance: Min guard Gait Distance (Feet): 8 Feet Assistive device: Rolling walker (2 wheeled) Gait Pattern/deviations: Step-through pattern;Decreased stride length;Shuffle Gait velocity: decreased Gait velocity interpretation: <1.31 ft/sec, indicative of household  ambulator General Gait Details: slow gait pattenr. Patient walked around the bed. Cuying to stay int he walker with turns   Financial trader Rankin (Stroke Patients Only)       Balance Overall balance assessment: Needs assistance Sitting-balance support: Bilateral upper extremity supported Sitting balance-Leahy Scale: Fair     Standing balance support: Bilateral upper extremity supported Standing balance-Leahy Scale: Poor Standing balance comment: walker required for stability                              Pertinent Vitals/Pain Pain Assessment: No/denies pain    Home Living Family/patient expects to be discharged to:: Private residence Living Arrangements: Children Available Help at Discharge: Family;Personal care attendant Type of Home: House Home Access: Stairs to enter Entrance Stairs-Rails: None Entrance Stairs-Number of Steps: 3 Home Layout: One level Home Equipment: Grab bars - toilet;Shower seat;Walker - 4 wheels      Prior Function Level of Independence: Needs assistance   Gait / Transfers Assistance Needed: uses rollator  ADL's / Homemaking Assistance Needed: has a home health adie that bathes and help dress her   Comments: gets to bathroom on her own and manages toileting.  PCA 7 days a week for adls     Hand Dominance        Extremity/Trunk Assessment   Upper Extremity Assessment Upper Extremity Assessment: Defer to OT evaluation  Lower Extremity Assessment Lower Extremity Assessment: Generalized weakness       Communication   Communication: No difficulties  Cognition Arousal/Alertness: Lethargic Behavior During Therapy: Flat affect Overall Cognitive Status: Within Functional Limits for tasks assessed                                 General Comments: flat affect but answered all questions and followed all commands       General Comments      Exercises      Assessment/Plan    PT Assessment Patient needs continued PT services  PT Problem List Decreased strength;Decreased activity tolerance;Decreased balance;Decreased mobility;Decreased knowledge of use of DME;Decreased safety awareness       PT Treatment Interventions DME instruction;Gait training;Stair training;Functional mobility training;Therapeutic activities;Therapeutic exercise;Neuromuscular re-education;Patient/family education    PT Goals (Current goals can be found in the Care Plan section)  Acute Rehab PT Goals Patient Stated Goal: to go home  PT Goal Formulation: With patient Time For Goal Achievement: 02/28/19 Potential to Achieve Goals: Good    Frequency Min 3X/week   Barriers to discharge        Co-evaluation               AM-PAC PT "6 Clicks" Mobility  Outcome Measure Help needed turning from your back to your side while in a flat bed without using bedrails?: A Lot Help needed moving from lying on your back to sitting on the side of a flat bed without using bedrails?: A Lot Help needed moving to and from a bed to a chair (including a wheelchair)?: A Lot Help needed standing up from a chair using your arms (e.g., wheelchair or bedside chair)?: A Lot Help needed to walk in hospital room?: A Lot Help needed climbing 3-5 steps with a railing? : Total 6 Click Score: 11    End of Session Equipment Utilized During Treatment: Gait belt Activity Tolerance: Patient limited by fatigue Patient left: in chair;with call bell/phone within reach;with chair alarm set Nurse Communication: Mobility status PT Visit Diagnosis: Unsteadiness on feet (R26.81);Other abnormalities of gait and mobility (R26.89);Muscle weakness (generalized) (M62.81)    Time: [id number] PT Time Calculation (min) (ACUTE ONLY): 26 min   Charges:   PT Evaluation $PT Eval Moderate Complexity: 1 Mod            Carney Living PT DPT  02/21/2019, 10:53 AM

## 2019-02-21 NOTE — Consult Note (Signed)
WOC Nurse Consult Note: Patient receiving care in [facility] 5M16.  Consult completed remotely after review of record, in particular, the flowsheet section for wound characteristics Reason for Consult: "pressure ulcers" Wound type: per RN flowsheet documentation, stage 2 to the sacrum and right buttock Pressure Injury POA: Yes/No/NA Measurement: see flowsheet documentation of wound specifics Wound bed: Drainage (amount, consistency, odor)  Periwound: Dressing procedure/placement/frequency: I have initiated the Skin Care Order Set for foam dressings to the area.  This is an order set that any nurse can initiate for this type of wound. Monitor the wound area(s) for worsening of condition such as: Signs/symptoms of infection,  Increase in size,  Development of or worsening of odor, Development of pain, or increased pain at the affected locations.  Notify the medical team if any of these develop.  Thank you for the consult.  [place] nurse will not follow at this time.  Please re-consult the [place] team if needed.  Val Riles, RN, MSN, CWOCN, CNS-BC, pager [phone]

## 2019-02-21 NOTE — TOC Initial Note (Addendum)
Transition of Care [facility]) - Initial/Assessment Note    Patient Details  Name: [name] MRN: [id number] Date of Birth: 02-Aug-1950  Transition of Care [facility]) CM/SW Contact:    Jamie Sam, LCSW Phone Number: 02/21/2019, 4:03 PM  Clinical Narrative:                  CSW spoke with patient's daughter [name] regarding discharge planning and SNF recommendation. She reports she is able to continue to assist in the care for patient at home and is refusing SNF at this time. She is interested in receiving a hoyer lift and 3in1 for DME needs at home. She reports patient currently has a walker at home. She states patient receives aid services for bathing and meals through [facility]. [name] in agreement with home health PT being set up prior to dc, no preference of agency at this time.   Patient will need DME orders for 3in1 and hoyer lift prior to dc.  Patient will need home health orders for PT and OT prior to dc. RN may also need to be ordered for wound care.  Expected Discharge Plan: [place] Barriers to Discharge: Continued Medical Work up   Patient Goals and CMS Choice Patient states their goals for this hospitalization and ongoing recovery are:: to go home CMS Medicare.gov Compare Post Acute Care list provided to:: Patient Represenative (must comment)(daughter [name]) Choice offered to / list presented to : Adult Children  Expected Discharge Plan and Services Expected Discharge Plan: [place] Choice: [place] arrangements for the past 2 months: Single Family Home                                      Prior Living Arrangements/Services Living arrangements for the past 2 months: Single Family Home Lives with:: Adult Children Patient language and need for interpreter reviewed:: Yes Do you feel safe going back to the place where you live?: Yes      Need for Family Participation in Patient Care: Yes  (Comment) Care giver support system in place?: Yes (comment)   Criminal Activity/Legal Involvement Pertinent to Current Situation/Hospitalization: No - Comment as needed  Activities of Daily Living Home Assistive Devices/Equipment: Cane (specify quad or straight), CBG Meter, Walker (specify type) ADL Screening (condition at time of admission) Patient's cognitive ability adequate to safely complete daily activities?: Yes Is the patient deaf or have difficulty hearing?: No Does the patient have difficulty seeing, even when wearing glasses/contacts?: No Does the patient have difficulty concentrating, remembering, or making decisions?: No Patient able to express need for assistance with ADLs?: Yes Does the patient have difficulty dressing or bathing?: No Independently performs ADLs?: Yes (appropriate for developmental age) Does the patient have difficulty walking or climbing stairs?: Yes Weakness of Legs: Both Weakness of Arms/Hands: None  Permission Sought/Granted Permission sought to share information with : Case Manager, Customer service manager, Family Supports Permission granted to share information with : Yes, Verbal Permission Granted  Share Information with NAME: Tiffanie  Permission granted to share info w AGENCY: home health  Permission granted to share info w Relationship: daughter  Permission granted to share info w Contact Information: [phone]  Emotional Assessment Appearance:: Appears stated age Attitude/Demeanor/Rapport: Unable to Assess Affect (typically observed): Unable to Assess     Psych Involvement: No (comment)  Admission diagnosis:  Uncontrolled diabetes mellitus ([place]) [E11.65] Urinary tract infection without hematuria, site unspecified [N39.0] Diabetic ketoacidosis without coma associated with type 1 diabetes mellitus ([place]) [E10.10] Patient Active Problem List   Diagnosis Date Noted  . Uncontrolled diabetes mellitus ([place]) 02/20/2019  . Pressure  injury of skin 02/20/2019  . Demand ischemia ([place])   . Abnormal LFTs   . Acute pancreatitis 01/12/2019  . DKA (diabetic ketoacidoses) ([place]) 01/12/2019  . Vitamin D deficiency 11/05/2018  . Vitamin B 12 deficiency 11/05/2018  . Tinea   . Sleep apnea   . Peripheral neuropathy   . Oxygen dependent   . Obesity   . Insomnia   . Hyperlipidemia   . Dermal mycosis   . Depression   . Coronary artery disease   . CHF (congestive heart failure) ([place])   . Arthritis   . Anxiety   . AICD (automatic cardioverter/defibrillator) present   . Heme positive stool   . Benign neoplasm of transverse colon   . Bundle branch block 05/08/2016  . Left leg pain   . DVT (deep venous thrombosis) ([place]) 09/30/2015  . Syncope 03/11/2015  . AKI (acute kidney injury) ([place]) 03/11/2015  . Hypotension 03/11/2015  . Syncope and collapse 03/11/2015  . Ventricular tachycardia ([place]) 01/17/2015  . Pain in the chest   . SOB (shortness of breath)   . Thyroid nodule   . CAP (community acquired pneumonia)   . Chest pain 05/26/2014  . Right flank pain 05/26/2014  . Right flank discomfort   . Chronic combined systolic and diastolic heart failure ([place] )   . Angina pectoris ([place])   . Morbid obesity ([place]) 11/06/2012  . Angina decubitus ([place]) 11/06/2012  . LBBB (left bundle branch block) 09/24/2012  . Chronic systolic dysfunction of left ventricle 09/24/2012  . Hypoglycemia 09/09/2012  . HLD (hyperlipidemia) 04/23/2012  . Insulin dependent diabetes mellitus ([place]) 04/23/2012  . Acute renal failure ([place]) 03/10/2012  . Ischemic cardiomyopathy 06/18/2010  . CAD (coronary artery disease) 06/18/2010  . Hypertension 06/18/2010  . 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   PCP:  Jamie Beals, NP Pharmacy:   [facility] [address]), [place] - [address] [place] [address] Phone: [phone] Fax: [facility] [place], [place] [address] Phone: [phone] Fax: [phone]     Social Determinants of Health (SDOH) Interventions    Readmission Risk Interventions No flowsheet data found.

## 2019-02-21 NOTE — Progress Notes (Signed)
Progress Note    [name]  [id number] DOB: 1950-11-22  DOA: 02/20/2019 PCP: Everardo Beals, NP    Brief Narrative:     Medical records reviewed and are as summarized below:  [name] is an 69 y.o. female with medical history significant of  OSA on CPAP; CAD s/p CABG; morbid obesity (BMI 40); HTN; HLD; DM; and chronic combined CHF with AICD in place presenting with hyperglycemia.  She reports that her blood sugar was high and her doctor sent her.  She misses her insulin "it look like a lot."  She feels light in her legs, hard to stand by herself.    Per chart review this apparently has been going on since discharge during last hospitalization in January  Assessment/Plan:   Principal Problem:   Uncontrolled diabetes mellitus ([place]) Active Problems:   CAD (coronary artery disease)   Hypertension   HLD (hyperlipidemia)   Morbid obesity ([place])   Chronic combined systolic and diastolic heart failure (HCC)   Pressure injury of skin   Weakness/FTT -Multifactorial: Obesity, uncontrolled diabetes, heart failure -Has been seen by PT and they recommend skilled nursing facility for short-term rehab.  I suspect this will be good for the patient as she will be getting her insulin on a regular basis there as she does not appear to take at home regularly  Uncontrolled DM type II hyperglycemia -Patient with long-standing uncontrolled DM - since 2011, she has never had an A1c better than 7.8 (that was in 09/2015) -She has progressive weakness -She reports frequently missing her insulin -Her glucose was 461 on presentation, which was better than 587 from yesterday's labs -She had minimally elevated anion gap and normal CO2 - not in DKA -Continue basal insulin and add bolus and SSI -Hold Glucotrol and Ozempic  Elevated lactate -Patient was dehydrated on admission as evidenced by ketonuria -She does not have SIRS criteria suggestive of sepsis -She could have a UTI (was  given Rocephin x 1) and has a urine culture pending - but she denies UTI symptoms and her UA is more suggestive of dehydration in the setting of hyperglycemia  Chronic combined CHF -01/13/19 echo with EF roughly 50%, poor visualization due to body habitus  CAD -Recent cath with multivessel disease -Plan for ongoing medical management  HTN -Continue Coreg  HLD -Continue Crestor  Morbid obesity Estimated body mass index is 40.11 kg/m as calculated from the following:   Height as of this encounter: 5\' 3"  (1.6 m).   Weight as of this encounter: 102.7 kg.  Pressure injury of skin -Nursing reports stage 2 buttocks and sacral pressure ulcers -Will request wound care consultation  Stage 3b CKD -Appears to be at baseline  H/o DVT -Continue Xarelto    Family Communication/Anticipated D/C date and plan/Code Status   DVT prophylaxis: Xarelto Code Status: Full Code.  Family Communication:  Disposition Plan: TOC consulted for skilled nursing facility     Subjective:   Complaining of left side pain  Objective:    Vitals:   02/20/19 1356 02/20/19 2048 02/21/19 0456 02/21/19 0913  BP: 116/62 124/80 115/78 115/72  Pulse: 79 88 85 79  Resp: 18 20 16  (!) 22  Temp: 99.3 F (37.4 C) 99 F (37.2 C) 98.8 F (37.1 C) 98.3 F (36.8 C)  TempSrc: Oral Oral Oral Oral  SpO2: 99% 96% 95% 96%  Weight:  102.7 kg    Height:        Intake/Output Summary (Last 24  hours) at 02/21/2019 1121 Last data filed at 02/21/2019 0900 Gross per 24 hour  Intake 1219.83 ml  Output 800 ml  Net 419.83 ml   Filed Weights   02/20/19 1356 02/20/19 2048  Weight: 102.3 kg 102.7 kg    Exam: In bed, older than stated age Regular rate and rhythm No increased work of breathing Alert and oriented   Data Reviewed:   I have personally reviewed following labs and imaging studies:  Labs: Labs show the following:   Basic Metabolic Panel: Recent Labs  Lab 02/16/19 1122 02/16/19 1122  02/19/19 1549 02/19/19 1549 02/20/19 0917 02/21/19 0447  NA 137  --  130*  --  130* 133*  K 4.3   < > 4.6   < > 4.6 4.0  CL 91*  --  89*  --  91* 95*  CO2 25  --  23  --  23 24  GLUCOSE 327*  --  587*  --  461* 174*  BUN 31*  --  32*  --  27* 17  CREATININE 2.05*  --  1.70*  --  1.52* 1.08*  CALCIUM 10.3  --  9.7  --  9.7 9.4   < > = values in this interval not displayed.   GFR Estimated Creatinine Clearance: 57.1 mL/min (A) (by C-G formula based on SCr of 1.08 mg/dL (H)). Liver Function Tests: Recent Labs  Lab 02/16/19 1122 02/19/19 1549  AST 25 47*  ALT 21 42*  ALKPHOS 105 99  BILITOT 0.5 0.4  PROT 8.4 7.6  ALBUMIN 3.7* 3.2*   No results for input(s): LIPASE, AMYLASE in the last 168 hours. No results for input(s): AMMONIA in the last 168 hours. Coagulation profile No results for input(s): INR, PROTIME in the last 168 hours.  CBC: Recent Labs  Lab 02/16/19 1122 02/20/19 0917 02/21/19 0447  WBC 8.0 6.7 5.5  NEUTROABS  --  4.9  --   HGB 11.8 10.8* 9.9*  HCT 38.1 34.1* 30.6*  MCV 91 94.5 91.1  PLT 262 169 177   Cardiac Enzymes: No results for input(s): CKTOTAL, CKMB, CKMBINDEX, TROPONINI in the last 168 hours. BNP (last 3 results) No results for input(s): PROBNP in the last 8760 hours. CBG: Recent Labs  Lab 02/20/19 1212 02/20/19 1355 02/20/19 1727 02/20/19 2048 02/21/19 0653  GLUCAP 355* 234* 308* 237* 172*   D-Dimer: No results for input(s): DDIMER in the last 72 hours. Hgb A1c: No results for input(s): HGBA1C in the last 72 hours. Lipid Profile: No results for input(s): CHOL, HDL, LDLCALC, TRIG, CHOLHDL, LDLDIRECT in the last 72 hours. Thyroid function studies: No results for input(s): TSH, T4TOTAL, T3FREE, THYROIDAB in the last 72 hours.  Invalid input(s): FREET3 Anemia work up: No results for input(s): VITAMINB12, FOLATE, FERRITIN, TIBC, IRON, RETICCTPCT in the last 72 hours. Sepsis Labs: Recent Labs  Lab 02/16/19 1122 02/20/19 0917  02/20/19 0955 02/20/19 1236 02/20/19 1844 02/20/19 2120 02/21/19 0447  WBC 8.0 6.7  --   --   --   --  5.5  LATICACIDVEN  --   --  2.2* 2.7* 1.7 2.1*  --     Microbiology Recent Results (from the past 240 hour(s))  SARS CORONAVIRUS 2 (TAT 6-24 HRS) Nasopharyngeal Nasopharyngeal Swab     Status: None   Collection Time: 02/20/19 12:41 PM   Specimen: Nasopharyngeal Swab  Result Value Ref Range Status   SARS Coronavirus 2 NEGATIVE NEGATIVE Final    Comment: (NOTE) SARS-CoV-2 target nucleic acids  are NOT DETECTED. The SARS-CoV-2 RNA is generally detectable in upper and lower respiratory specimens during the acute phase of infection. Negative results do not preclude SARS-CoV-2 infection, do not rule out co-infections with other pathogens, and should not be used as the sole basis for treatment or other patient management decisions. Negative results must be combined with clinical observations, patient history, and epidemiological information. The expected result is Negative. Fact Sheet for Patients: SugarRoll.be Fact Sheet for Healthcare Providers: https://www.woods-mathews.com/ This test is not yet approved or cleared by the [place] FDA and  has been authorized for detection and/or diagnosis of SARS-CoV-2 by FDA under an Emergency Use Authorization (EUA). This EUA will remain  in effect (meaning this test can be used) for the duration of the COVID-19 declaration under Section 56 4(b)(1) of the Act, 21 U.S.C. section 360bbb-3(b)(1), unless the authorization is terminated or revoked sooner. Performed at [facility], [place] [address], [place], [place] [address]     Procedures and diagnostic studies:  No results found.  Medications:   . amitriptyline  10 mg Oral QHS  . carvedilol  6.25 mg Oral BID WC  . docusate sodium  100 mg Oral BID  . insulin aspart  0-20 Units Subcutaneous TID WC  . insulin aspart  0-5 Units  Subcutaneous QHS  . insulin aspart  6 Units Subcutaneous TID WC  . insulin detemir  15 Units Subcutaneous BID  . latanoprost  1 drop Both Eyes QHS  . rivaroxaban  20 mg Oral Daily  . rosuvastatin  20 mg Oral Daily  . sodium chloride flush  3 mL Intravenous Q12H  . traMADol  50 mg Oral BID   Continuous Infusions: . lactated ringers 100 mL/hr at 02/21/19 0618     LOS: 0 days   Geradine Girt  [facility]   How to contact the [facility] Attending or Consulting provider [place] or covering provider during after hours [place], for this patient?  1. Check the care team in [facility] and look for a) attending/consulting TRH provider listed and b) the [facility] team listed 2. Log into [email or web address] and use 's universal password to access. If you do not have the password, please contact the hospital operator. 3. Locate the [facility] provider you are looking for under [facility] and page to a number that you can be directly reached. 4. If you still have difficulty reaching the provider, please page the [facility] (Director on Call) for the Hospitalists listed on amion for assistance.  02/21/2019, 11:21 AM

## 2019-02-21 NOTE — Evaluation (Addendum)
Occupational Therapy Evaluation Patient Details Name: [name] MRN: [id number] DOB: Aug 30, 1950 Today's Date: 02/21/2019    History of Present Illness Patient is a 69 year old female admitted for hyperglycemia. Her blood sugar has decreased since admission  PMH:  CHF/ICM, home 02, CAD, CABG, ICD, DM, HTN, OSA, and DVT   Clinical Impression   Patient is a 69 year old female that lives with her children in a single level home with 3 steps to enter. Patient has care attendants 7 days a week to assist with ADLs such as bathing, dressing. Patient can toilet herself. Patient states her care attendants are there approximately 3 hrs out of the day, otherwise someone is home with her. Currently patient require min A for functional transfer with cues for body mechanics. Educate patient on DME for home toilet riser as patient states her toilet is low and is difficult to get up from at times. Recommend continued acute OT services to maximize patient independence with self care.    Follow Up Recommendations  SNF vs Home with family (if they feel they can care for patient at current level);Supervision/Assistance - 24 hour    Equipment Recommendations  Toilet rise with handles       Precautions / Restrictions Precautions Precautions: Fall Restrictions Weight Bearing Restrictions: No      Mobility Bed Mobility Overal bed mobility: Needs Assistance Bed Mobility: Supine to Sit;Sit to Supine     Supine to sit: Min assist;HOB elevated Sit to supine: Mod assist   General bed mobility comments: min A for trunk support to sitting, modA to lift LEs onto bed  Transfers Overall transfer level: Needs assistance Equipment used: Rolling walker (2 wheeled) Transfers: Sit to/from Stand Sit to Stand: Min assist         General transfer comment: min guard from edge of bed, min A from chair at sink side with cues for body mechanics    Balance Overall balance assessment: Needs  assistance Sitting-balance support: Feet supported;No upper extremity supported Sitting balance-Leahy Scale: Good     Standing balance support: Bilateral upper extremity supported;During functional activity Standing balance-Leahy Scale: Poor Standing balance comment: walker required for stability                            ADL either performed or assessed with clinical judgement   ADL Overall ADL's : Needs assistance/impaired Eating/Feeding: Independent   Grooming: Oral care;Wash/dry face;Wash/dry hands;Set up;Sitting Grooming Details (indicate cue type and reason): limited standing tolerance, has to sit in chair to perform grooming/hygiene tasks Upper Body Bathing: Set up;Sitting Upper Body Bathing Details (indicate cue type and reason): wash underarms Lower Body Bathing: Maximal assistance;Sitting/lateral leans   Upper Body Dressing : Set up;Sitting   Lower Body Dressing: Maximal assistance;Sitting/lateral leans;Sit to/from stand   Toilet Transfer: Minimal assistance;Cueing for safety;BSC;RW Toilet Transfer Details (indicate cue type and reason): min A to stand from chair at sink as it does not have arms+ lower surface         Functional mobility during ADLs: Minimal assistance;Rolling walker                    Pertinent Vitals/Pain Pain Assessment: Faces Faces Pain Scale: Hurts little more Pain Location: B knees Pain Descriptors / Indicators: Aching;Grimacing Pain Intervention(s): Limited activity within patient's tolerance     Hand Dominance     Extremity/Trunk Assessment Upper Extremity Assessment Upper Extremity Assessment: Generalized weakness  Lower Extremity Assessment Lower Extremity Assessment: Defer to PT evaluation       Communication Communication Communication: No difficulties   Cognition Arousal/Alertness: Awake/alert Behavior During Therapy: WFL for tasks assessed/performed Overall Cognitive Status: Within Functional Limits  for tasks assessed                                 General Comments: flat affect but answered all questions and followed all commands    General Comments  educate patient on benefits of toilet riser / commode over toilet at home to maximize patient safety and indepedence with toilet transfers, provide visual aids and verbal explaination for set up            [place] expects to be discharged to:: Private residence Living Arrangements: Children Available Help at Discharge: Family;Personal care attendant;Available 24 hours/day Type of Home: House Home Access: Stairs to enter [employer] of Steps: 3 Entrance Stairs-Rails: None Home Layout: One level     Bathroom Shower/Tub: Teacher, early years/pre: Standard Bathroom Accessibility: Yes How Accessible: Accessible via walker Home Equipment: [place] - 4 wheels;Bedside commode          Prior Functioning/Environment Level of Independence: Needs assistance  Gait / Transfers Assistance Needed: uses rollator ADL's / Homemaking Assistance Needed: has a home health aide that bathes and help dress her    Comments: gets to bathroom, on her own and manages toileting.  PCA 7 days a week for adls. patient states she sponge bathes does not use tub/shower        OT Problem List: Decreased activity tolerance;Decreased strength;Impaired balance (sitting and/or standing);Decreased safety awareness;Decreased knowledge of use of DME or AE;Obesity      OT Treatment/Interventions: Self-care/ADL training;Therapeutic exercise;DME and/or AE instruction;Energy conservation;Therapeutic activities;Patient/family education;Balance training    OT Goals(Current goals can be found in the care plan section) Acute Rehab OT Goals Patient Stated Goal: to go home  OT Goal Formulation: With patient Time For Goal Achievement: 03/07/19 Potential to Achieve Goals: Good  OT Frequency: Min 2X/week    AM-PAC  OT "6 Clicks" Daily Activity     Outcome Measure Help from another person eating meals?: None Help from another person taking care of personal grooming?: A Little Help from another person toileting, which includes using toliet, bedpan, or urinal?: A Little Help from another person bathing (including washing, rinsing, drying)?: A Lot Help from another person to put on and taking off regular upper body clothing?: A Little Help from another person to put on and taking off regular lower body clothing?: A Lot 6 Click Score: 17   End of Session Equipment Utilized During Treatment: Rolling walker Nurse Communication: Mobility status  Activity Tolerance: Patient tolerated treatment well Patient left: in bed;with call bell/phone within reach;with bed alarm set  OT Visit Diagnosis: Other abnormalities of gait and mobility (R26.89);Muscle weakness (generalized) (M62.81)                Time: [id number] OT Time Calculation (min): 38 min Charges:  OT General Charges $OT Visit: 1 Visit OT Evaluation $OT Eval Moderate Complexity: 1 Mod OT Treatments $Self Care/Home Management : 23-37 mins  Shon Millet OT OT office: [place] 02/21/2019, 12:37 PM

## 2019-02-21 NOTE — Plan of Care (Signed)

## 2019-02-22 DIAGNOSIS — E1165 Type 2 diabetes mellitus with hyperglycemia: Secondary | ICD-10-CM | POA: Diagnosis not present

## 2019-02-22 LAB — BASIC METABOLIC PANEL
Anion gap: 11 (ref 5–15)
BUN: 12 mg/dL (ref 8–23)
CO2: 22 mmol/L (ref 22–32)
Calcium: 9 mg/dL (ref 8.9–10.3)
Chloride: 98 mmol/L (ref 98–111)
Creatinine, Ser: 1.1 mg/dL — ABNORMAL HIGH (ref 0.44–1.00)
GFR calc Af Amer: 60 mL/min — ABNORMAL LOW (ref >60)
GFR calc non Af Amer: 52 mL/min — ABNORMAL LOW (ref >60)
Glucose, Bld: 217 mg/dL — ABNORMAL HIGH (ref 70–99)
Potassium: 4.4 mmol/L (ref 3.5–5.1)
Sodium: 131 mmol/L — ABNORMAL LOW (ref 135–145)

## 2019-02-22 LAB — CBC
HCT: 31 % — ABNORMAL LOW (ref 36.0–46.0)
Hemoglobin: 9.9 g/dL — ABNORMAL LOW (ref 12.0–15.0)
MCH: 29 pg (ref 26.0–34.0)
MCHC: 31.9 g/dL (ref 30.0–36.0)
MCV: 90.9 fL (ref 80.0–100.0)
Platelets: 169 10*3/uL (ref 150–400)
RBC: 3.41 MIL/uL — ABNORMAL LOW (ref 3.87–5.11)
RDW: 15.6 % — ABNORMAL HIGH (ref 11.5–15.5)
WBC: 5 10*3/uL (ref 4.0–10.5)
nRBC: 0 % (ref 0.0–0.2)

## 2019-02-22 LAB — GLUCOSE, CAPILLARY
Glucose-Capillary: 144 mg/dL — ABNORMAL HIGH (ref 70–99)
Glucose-Capillary: 174 mg/dL — ABNORMAL HIGH (ref 70–99)
Glucose-Capillary: 194 mg/dL — ABNORMAL HIGH (ref 70–99)

## 2019-02-22 LAB — URINE CULTURE: Culture: >=100000 — AB

## 2019-02-22 MED ORDER — INSULIN STARTER KIT- PEN NEEDLES (ENGLISH)
1.0000 | Freq: Once | Status: AC
  Administered 2019-02-22: 1
  Filled 2019-02-22: qty 1

## 2019-02-22 MED ORDER — METHOCARBAMOL 500 MG PO TABS: 500.0000 mg | ORAL_TABLET | Freq: Two times a day (BID) | ORAL | Status: DC | PRN

## 2019-02-22 MED ORDER — CARVEDILOL 6.25 MG PO TABS: 6.2500 mg | ORAL_TABLET | Freq: Two times a day (BID) | ORAL | 0 refills | Status: DC

## 2019-02-22 MED ORDER — CEPHALEXIN 500 MG PO CAPS
500.0000 mg | ORAL_CAPSULE | Freq: Two times a day (BID) | ORAL | Status: DC
  Administered 2019-02-22: 500 mg via ORAL
  Filled 2019-02-22: qty 1

## 2019-02-22 MED ORDER — NOVOLOG FLEXPEN 100 UNIT/ML ~~LOC~~ SOPN: 6.0000 [IU] | PEN_INJECTOR | Freq: Three times a day (TID) | SUBCUTANEOUS | 11 refills | Status: DC

## 2019-02-22 MED ORDER — CEPHALEXIN 500 MG PO CAPS: 500.0000 mg | ORAL_CAPSULE | Freq: Two times a day (BID) | ORAL | 0 refills | Status: DC

## 2019-02-22 NOTE — Discharge Instructions (Signed)
You Tube Video on Using Insulin Pens at home  [email or web address]  Symptoms of Hypoglycemia: Silly, Sweaty, Shaky Check sugar if you have your meter.  If near or less than 70 mg/dl, treat with 1/2 cup juice or soda or take glucose tablets Check sugar 15 minutes after treatment.  If sugar still near or less than 70 mg/al and symptomatic, treat again and may need a snack with some protein (peanut butter with crackers, etc)  Fingerstick glucose (sugar) goals for home: Before meals: 80-130 mg/dl 2-Hours after meals: less than 180 mg/dl Hemoglobin A1c goal: 7% or less     Insulin Injection Instructions, Using Insulin Pens, Adult A subcutaneous injection is a shot of medicine that is injected into the layer of fat and tissue between skin and muscle. People with type 1 diabetes must take insulin because their bodies do not make it. People with type 2 diabetes may need to take insulin. There are many different types of insulin. The type of insulin that you take may determine how many injections you give yourself and when you need to give the injections. Supplies needed:  Soap and water to wash hands.  Your insulin pen.  A new, unused needle.  Alcohol wipes.  A disposal container that is meant for sharp items (sharps container), such as an empty plastic bottle with a cover. How to choose a site for injection  The body absorbs insulin differently, depending on where the insulin is injected (injection site). It is best to inject insulin into the same body area each time (for example, always in the abdomen), but you should use a different spot in that area for each injection. Do not inject the insulin in the same spot each time. There are five main areas that can be used for injecting. These areas include:  Abdomen. This is the preferred area.  Front of thigh.  Upper, outer side of thigh.  Upper, outer side of arm.  Upper, outer part of buttock. How to use an  insulin pen   First, follow the steps for Get ready, then continue with the steps for Inject the insulin. Get ready 1. Wash your hands with soap and water. If soap and water are not available, use hand sanitizer. 2. Before you give yourself an insulin injection, be sure to test your blood sugar level (blood glucose level) and write down that number. Follow any instructions from your health care provider about what to do if your blood glucose level is higher or lower than your normal range. 3. Check the expiration date and the type of insulin that is in the pen. 4. If you are using CLEAR insulin, check to see that it is clear and free of clumps. 5. If you are using CLOUDY insulin, do not shake the pen to get the injection ready. Instead, get it ready in one of these ways: ? Gently roll the pen between your palms several times. ? Tip the pen up and down several times. 6. Remove the cap from the insulin pen. 7. Use an alcohol wipe to clean the rubber tip of the pen. 8. Remove the protective paper tab from the disposable needle. Do not let the needle touch anything. 9. Screw a new, unused needle onto the pen. 10. Remove the outer plastic needle cover. Do not throw away the outer plastic cover yet. ? If the pen uses a special safety needle, leave the inner needle shield in place. ? If the pen does not use a special safety  needle, remove the inner plastic cover from the needle. 11. Follow the manufacturer's instructions to prime the insulin pen with the volume of insulin needed. Hold the pen with the needle pointing up, and push the button on the opposite end of the pen until a drop of insulin appears at the needle tip. If no insulin appears, repeat this step. 12. Turn the button (dial) to the number of units of insulin that you will be injecting. Inject the insulin 1. Use an alcohol wipe to clean the site where you will be injecting the needle. Let the site air-dry. 2. Hold the pen in the palm of  your writing hand like a pencil. 3. If directed by your health care provider, use your other hand to pinch and hold about an inch (2.5 cm) of skin at the injection site. Do not directly touch the cleaned part of the skin. 4. Gently but quickly, use your writing hand to put the needle straight into the skin. The needle should be at a 90-degree angle (perpendicular) to the skin. 5. When the needle is completely inserted into the skin, use your thumb or index finger of your writing hand to push the top button of the pen down all the way to inject the insulin. 6. Let go of the skin that you are pinching. Continue to hold the pen in place with your writing hand. 7. Wait 10 seconds, then pull the needle straight out of the skin. This will allow all of the insulin to go from the pen and needle into your body. 8. Carefully put the larger (outer) plastic cover of the needle back over the needle, then unscrew the capped needle and discard it in a sharps container, such as an empty plastic bottle with a cover. 9. Put the plastic cap back on the insulin pen. How to throw away supplies  Discard all used needles in a puncture-proof sharps disposal container. You can ask your local pharmacy about where you can get this kind of disposal container, or you can use an empty plastic liquid laundry detergent bottle that has a cover.  Follow the disposal regulations for the area where you live. Do not use any needle more than one time.  Throw away empty disposable pens in the regular trash. Questions to ask your health care provider  How often should I be taking insulin?  How often should I check my blood glucose?  What amount of insulin should I be taking at each time?  What are the side effects?  What should I do if my blood glucose is too high?  What should I do if my blood glucose is too low?  What should I do if I forget to take my insulin?  What number should I call if I have questions? Where to find  more information  American Diabetes Association (ADA): www.diabetes.org  American Association of Diabetes Educators (AADE) Patient Resources: https://www.diabeteseducator.org Summary  A subcutaneous injection is a shot of medicine that is injected into the layer of fat and tissue between skin and muscle.  Before you give yourself an insulin injection, be sure to test your blood sugar level (blood glucose level) and write down that number.  Check the expiration date and the type of insulin that is in the pen. The type of insulin that you take may determine how many injections you give yourself and when you need to give the injections.  It is best to inject insulin into the same body area each  time (for example, always in the abdomen), but you should use a different spot in that area for each injection. This information is not intended to replace advice given to you by your health care provider. Make sure you discuss any questions you have with your health care provider. Document Revised: 01/14/2017 Document Reviewed: 01/28/2015 Elsevier Patient Education  2020 [employer].

## 2019-02-22 NOTE — Progress Notes (Signed)
Patient verbalized understanding of the insulin kit and said she already knows how to administer herself insulin, she went over the process.   

## 2019-02-22 NOTE — Progress Notes (Signed)
[name] [place] to be discharged Home per MD order. Discussed prescriptions and follow up appointments with the patient. Prescriptions explained to patient; medication list explained in detail. Patient verbalized understanding.  Insulin kit given to patient.  Skin clean, dry and intact without evidence of skin break down, no evidence of skin tears noted. IV catheter discontinued intact. Site without signs and symptoms of complications. Dressing and pressure applied. Pt denies pain at the site currently. No complaints noted.  Patient free of lines, drains, and wounds.   An After Visit Summary (AVS) was printed and given to the patient. Patient escorted via wheelchair, and discharged home via private auto.  Amaryllis Dyke, RN

## 2019-02-22 NOTE — Progress Notes (Signed)
Tried to reach daughter and son regarding discharge, no one answered and couldn't leave voicemail as their voicemail were full.

## 2019-02-22 NOTE — TOC Transition Note (Addendum)
Transition of Care [facility]) - CM/SW Discharge Note   Patient Details  Name: [name] MRN: [id number] Date of Birth: [birth year]  [facility]) CM/SW Contact:  Carles Collet, RN Phone Number: 02/22/2019, 12:21 PM   Clinical Narrative:    1220- Spoke w patient at bedside. Chart from last month states that she was discharged with 3/1 and [place] services through [place]. Patient confirms this. [place]oyer to be delivered to [place]. This is her daughter [name]'s house. Alvis Lemmings confirmed they will resume HH.  I could not reach daughter, patient states she will and that daughter will provide transportation home.  1600- Continued to try to reach daughter no answer on any number and unable to leave VM either. Patient states that she cannot reach her either. Patient is to go to daughter's house at [place] so it needs to be established that someone is there to let her in.  1620 Received call back from [place] who states that it will be several hours before she can come get her mother at the hospital.     Final next level of care: [place] Barriers to Discharge: No Barriers Identified   Patient Goals and CMS Choice Patient states their goals for this hospitalization and ongoing recovery are:: to go home CMS Medicare.gov Compare Post Acute Care list provided to:: Patient Represenative (must comment)(daughter [name]) Choice offered to / list presented to : Adult Children  Discharge Placement                       Discharge Plan and Services     Post Acute Care Choice: Home Health          DME Arranged: Other see comment(hoyer) DME Agency: [employer] Date DME Agency Contacted: 02/22/19 Time DME Agency Contacted: 1220 Representative spoke with at DME Agency: zack HH Arranged: RN, PT, OT [place] Agency: [place] Date [place]: 02/22/19 Time [place]: 1221 Representative spoke with at [place]:  [place] ([place]) Interventions     Readmission Risk Interventions No flowsheet data found.

## 2019-02-22 NOTE — Discharge Summary (Signed)
Physician Discharge Summary  [name] [id number] DOB: Mar 17, 1950 DOA: 02/20/2019  PCP: Everardo Beals, NP  Admit date: 02/20/2019 Discharge date: 02/22/2019  Admitted From: Home Discharge disposition: Home with 24 7 supervision with family   Recommendations for Outpatient Follow-Up:   1. Home health: PT/OT/RN (RN for help with diabetic management and supervision of wounds) 2. Patient patient's daughter instructed to change her depends more frequently to avoid UTI 3. Patient has not been giving herself insulin, so discussed with daughter need for her to take over this   Discharge Diagnosis:   Principal Problem:   Uncontrolled diabetes mellitus ([place]) Active Problems:   CAD (coronary artery disease)   Hypertension   HLD (hyperlipidemia)   Morbid obesity ([place])   Chronic combined systolic and diastolic heart failure ([place])   Pressure injury of skin    Discharge Condition: Improved.  Diet recommendation: Low sodium, heart healthy.  Carbohydrate-modified.   Wound care: None.  Code status: Full.   History of Present Illness:   [name] is a 69 y.o. female with medical history significant of  OSA on CPAP; CAD s/p CABG; morbid obesity (BMI 40); HTN; HLD; DM; and chronic combined CHF with AICD in place presenting with hyperglycemia.  She reports that her blood sugar was high and her doctor sent her.  She misses her insulin "it look like a lot."  She feels light in her legs, hard to stand by herself.  No chest or abdominal pain.  No urinary symptoms.  She was last admitted from 1/4-13 with acute pancreatitis, planned for outpatient cholecystectomy; elevated troponin with cath showing multivessel disease, for med management; suspected DKA; and acute on chronic renal dysfunction.   Hospital Course by Problem:   Weakness/FTT -Multifactorial: Obesity, uncontrolled diabetes, UTI -Family prefers to take home: Home health ordered, DME equipment also  ordered  Uncontrolled DM type II hyperglycemia -Patient with long-standing uncontrolled DM - since 2011, she has never had an A1c better than 7.8 (that was in 09/2015) -She has progressive weakness -She reports frequently missing her insulin -She had minimally elevated anion gap and normal CO2 - not in DKA -Resume home meds with NovoLog changed to 3 times daily with meals, spoke with daughter who will take over care and giving her mother insulin: Home health RN for education as well  Proteus UTI -Given IV Rocephin and changed to p.o. to finish treatment  Chronic combined CHF -01/13/19 echo with EF roughly 50%, poor visualization due to body habitus  CAD -Recent cath with multivessel disease -Plan for ongoing medical management  HTN -Continue Coreg  HLD -Continue Crestor  Morbid obesity Estimated body mass index is 40.11 kg/m as calculated from the following:   Height as of this encounter: 5\' 3"  (1.6 m).   Weight as of this encounter: 102.7 kg.  Pressure injury of skin -Nursing reports stage 2 buttocks and sacral pressure ulcers-POA -Will request wound care consultation  Stage 3b CKD -Appears to be at baseline  H/o DVT -Continue Xarelto     Medical Consultants:      Discharge Exam:   Vitals:   02/22/19 0516 02/22/19 0838  BP: 138/74 117/61  Pulse: 81 82  Resp: 16 20  Temp: 99.3 F (37.4 C) 98.8 F (37.1 C)  SpO2: 93% 96%   Vitals:   02/21/19 1842 02/21/19 2214 02/22/19 0516 02/22/19 0838  BP: 113/64 118/67 138/74 117/61  Pulse: 84 79 81 82  Resp:  16 16 20  Temp: 98.5 F (36.9 C) 98.9 F (37.2 C) 99.3 F (37.4 C) 98.8 F (37.1 C)  TempSrc: Oral Oral Oral   SpO2: 97% 97% 93% 96%  Weight:      Height:        General exam: Appears calm and comfortable.   The results of significant diagnostics from this hospitalization (including imaging, microbiology, ancillary and laboratory) are listed below for reference.     Procedures and  Diagnostic Studies:   No results found.   Labs:   Basic Metabolic Panel: Recent Labs  Lab 02/16/19 1122 02/16/19 1122 02/19/19 1549 02/19/19 1549 02/20/19 0917 02/20/19 0917 02/21/19 0447 02/22/19 0833  NA 137  --  130*  --  130*  --  133* 131*  K 4.3   < > 4.6   < > 4.6   < > 4.0 4.4  CL 91*  --  89*  --  91*  --  95* 98  CO2 25  --  23  --  23  --  24 22  GLUCOSE 327*  --  587*  --  461*  --  174* 217*  BUN 31*  --  32*  --  27*  --  17 12  CREATININE 2.05*  --  1.70*  --  1.52*  --  1.08* 1.10*  CALCIUM 10.3  --  9.7  --  9.7  --  9.4 9.0   < > = values in this interval not displayed.   GFR Estimated Creatinine Clearance: 56 mL/min (A) (by C-G formula based on SCr of 1.1 mg/dL (H)). Liver Function Tests: Recent Labs  Lab 02/16/19 1122 02/19/19 1549  AST 25 47*  ALT 21 42*  ALKPHOS 105 99  BILITOT 0.5 0.4  PROT 8.4 7.6  ALBUMIN 3.7* 3.2*   No results for input(s): LIPASE, AMYLASE in the last 168 hours. No results for input(s): AMMONIA in the last 168 hours. Coagulation profile No results for input(s): INR, PROTIME in the last 168 hours.  CBC: Recent Labs  Lab 02/16/19 1122 02/20/19 0917 02/21/19 0447 02/22/19 0833  WBC 8.0 6.7 5.5 5.0  NEUTROABS  --  4.9  --   --   HGB 11.8 10.8* 9.9* 9.9*  HCT 38.1 34.1* 30.6* 31.0*  MCV 91 94.5 91.1 90.9  PLT 262 169 177 169   Cardiac Enzymes: No results for input(s): CKTOTAL, CKMB, CKMBINDEX, TROPONINI in the last 168 hours. BNP: Invalid input(s): POCBNP CBG: Recent Labs  Lab 02/21/19 0653 02/21/19 1313 02/21/19 1708 02/21/19 2214 02/22/19 0636  GLUCAP 172* 202* 119* 147* 144*   D-Dimer No results for input(s): DDIMER in the last 72 hours. Hgb A1c No results for input(s): HGBA1C in the last 72 hours. Lipid Profile No results for input(s): CHOL, HDL, LDLCALC, TRIG, CHOLHDL, LDLDIRECT in the last 72 hours. Thyroid function studies No results for input(s): TSH, T4TOTAL, T3FREE, THYROIDAB in the last  72 hours.  Invalid input(s): FREET3 Anemia work up No results for input(s): VITAMINB12, FOLATE, FERRITIN, TIBC, IRON, RETICCTPCT in the last 72 hours. Microbiology Recent Results (from the past 240 hour(s))  Urine culture     Status: Abnormal   Collection Time: 02/20/19  9:18 AM   Specimen: Urine, Random  Result Value Ref Range Status   Specimen Description URINE, RANDOM  Final   Special Requests   Final    NONE Performed at [facility], [address], [place], [place] [address]    Culture >=100,000 COLONIES/mL PROTEUS  MIRABILIS (A)  Final   Report Status 02/22/2019 FINAL  Final   Organism ID, Bacteria PROTEUS MIRABILIS (A)  Final      Susceptibility   Proteus mirabilis - MIC*    AMPICILLIN <=2 SENSITIVE Sensitive     CEFAZOLIN <=4 SENSITIVE Sensitive     CEFTRIAXONE <=0.25 SENSITIVE Sensitive     CIPROFLOXACIN <=0.25 SENSITIVE Sensitive     GENTAMICIN <=1 SENSITIVE Sensitive     IMIPENEM 8 INTERMEDIATE Intermediate     NITROFURANTOIN 128 RESISTANT Resistant     TRIMETH/SULFA <=20 SENSITIVE Sensitive     AMPICILLIN/SULBACTAM <=2 SENSITIVE Sensitive     PIP/TAZO <=4 SENSITIVE Sensitive     * >=100,000 COLONIES/mL PROTEUS MIRABILIS  SARS CORONAVIRUS 2 (TAT 6-24 HRS) Nasopharyngeal Nasopharyngeal Swab     Status: None   Collection Time: 02/20/19 12:41 PM   Specimen: Nasopharyngeal Swab  Result Value Ref Range Status   SARS Coronavirus 2 NEGATIVE NEGATIVE Final    Comment: (NOTE) SARS-CoV-2 target nucleic acids are NOT DETECTED. The SARS-CoV-2 RNA is generally detectable in upper and lower respiratory specimens during the acute phase of infection. Negative results do not preclude SARS-CoV-2 infection, do not rule out co-infections with other pathogens, and should not be used as the sole basis for treatment or other patient management decisions. Negative results must be combined with clinical observations, patient history, and epidemiological information. The  expected result is Negative. Fact Sheet for Patients: SugarRoll.be Fact Sheet for Healthcare Providers: https://www.woods-mathews.com/ This test is not yet approved or cleared by the [place] FDA and  has been authorized for detection and/or diagnosis of SARS-CoV-2 by FDA under an Emergency Use Authorization (EUA). This EUA will remain  in effect (meaning this test can be used) for the duration of the COVID-19 declaration under Section 56 4(b)(1) of the Act, 21 U.S.C. section 360bbb-3(b)(1), unless the authorization is terminated or revoked sooner. Performed at [facility], [place] [address], [place], [place] [address]      Discharge Instructions:   Discharge Instructions    Diet - low sodium heart healthy   Complete by: As directed    Diet Carb Modified   Complete by: As directed    Discharge instructions   Complete by: As directed    Keep a log of your blood sugars and be sure to take your insulin Take the novolog with meals   Increase activity slowly   Complete by: As directed      Allergies as of 02/22/2019      Reactions   Lipitor  [atorvastatin Calcium] Rash   Metformin And Related Itching, Swelling, Other (See Comments)   Leg pain & swelling in legs   Atorvastatin Itching, Rash   Levofloxacin Itching      Medication List    STOP taking these medications   Potassium Chloride ER 20 MEQ Tbcr     TAKE these medications   albuterol 108 (90 Base) MCG/ACT inhaler Commonly known as: VENTOLIN HFA Inhale 2 puffs into the lungs every 6 (six) hours as needed for wheezing or shortness of breath.   albuterol (2.5 MG/3ML) 0.083% nebulizer solution Commonly known as: PROVENTIL Take 2.5 mg by nebulization every 6 (six) hours as needed for wheezing or shortness of breath.   amitriptyline 10 MG tablet Commonly known as: ELAVIL Take 10 mg by mouth daily.   carvedilol 6.25 MG tablet Commonly known as: COREG Take 1  tablet (6.25 mg total) by mouth 2 (two) times daily with a  meal. What changed:   medication strength  how much to take  when to take this   cephALEXin 500 MG capsule Commonly known as: KEFLEX Take 1 capsule (500 mg total) by mouth every 12 (twelve) hours.   ferrous sulfate 325 (65 FE) MG EC tablet Take 325 mg by mouth daily.   glipiZIDE 5 MG tablet Commonly known as: GLUCOTROL Take 5 mg by mouth daily before breakfast.   latanoprost 0.005 % ophthalmic solution Commonly known as: XALATAN Place 1 drop into both eyes at bedtime.   Levemir FlexTouch 100 UNIT/ML Pen Generic drug: Insulin Detemir Inject 15 Units into the skin 2 (two) times daily.   methocarbamol 500 MG tablet Commonly known as: ROBAXIN Take 1 tablet (500 mg total) by mouth 2 (two) times daily as needed for muscle spasms.   MUSCLE RELIEF EX Apply 1 application topically daily. For pain   nitroGLYCERIN 0.4 MG SL tablet Commonly known as: NITROSTAT Place 0.4 mg under the tongue every 5 (five) minutes as needed for chest pain.   NovoLOG FlexPen 100 UNIT/ML FlexPen Generic drug: insulin aspart Inject 6 Units into the skin 3 (three) times daily with meals. What changed:   how much to take  when to take this   OZEMPIC (0.25 OR 0.5 MG/DOSE) [place] Inject 0.5 mg into the skin every Thursday.   rosuvastatin 20 MG tablet Commonly known as: CRESTOR Take 1 tablet (20 mg total) by mouth daily at 6 PM. What changed: when to take this   traMADol 50 MG tablet Commonly known as: ULTRAM Take 50 mg by mouth 2 (two) times daily.   Vitamin D (Ergocalciferol) 1.25 MG (50000 UNIT) Caps capsule Commonly known as: DRISDOL Take 1 capsule (50,000 Units total) by mouth every 7 (seven) days. What changed: when to take this   Xarelto 20 MG Tabs tablet Generic drug: rivaroxaban Take 20 mg by mouth daily.            Durable Medical Equipment  (From admission, onward)         Start     Ordered   02/22/19 1012  For  home use only DME Other see comment  Once    Comments: hoyer  Question:  Length of Need  Answer:  Lifetime   02/22/19 1012   02/22/19 1011  For home use only DME 3 n 1  Once     02/22/19 1012         Follow-up Information    [place], Joelene Millin, NP Follow up in 1 week(s).   Contact information: [place] [address] [phone]        Delrae Rend, MD .   Specialty: Endocrinology Contact information: [address] [place] [address] [phone]        Jerline Pain, MD .   Specialty: Cardiology Contact information: [phone] [place] [address] [phone]            Time coordinating discharge: 35 min  Signed:  Geradine Girt DO  [facility] 02/22/2019, 10:13 AM

## 2019-03-03 ENCOUNTER — Telehealth: Payer: Self-pay

## 2019-03-03 NOTE — Telephone Encounter (Signed)
Left message for patient to remind of missed remote transmission.  

## 2019-03-09 ENCOUNTER — Telehealth: Payer: Self-pay | Admitting: Cardiology

## 2019-03-09 NOTE — Telephone Encounter (Signed)
Called and spoke to patient. She states that she wants her daughter to come up with her at her appointment. She states that she has trouble getting around and that her daughter helps with her care. Made her aware that I will make a note for her daughter to come up.

## 2019-03-09 NOTE — Telephone Encounter (Signed)
Patient called left message with answering service she would like her daughter to come with her to appt on 03/11/19.

## 2019-03-11 ENCOUNTER — Encounter: Payer: Self-pay | Admitting: Cardiology

## 2019-03-11 ENCOUNTER — Ambulatory Visit (INDEPENDENT_AMBULATORY_CARE_PROVIDER_SITE_OTHER): Payer: Medicare HMO | Admitting: Cardiology

## 2019-03-11 ENCOUNTER — Other Ambulatory Visit: Payer: Self-pay

## 2019-03-11 VITALS — BP 124/72 | HR 76 | Ht 63.0 in | Wt 226.0 lb

## 2019-03-11 DIAGNOSIS — Z95810 Presence of automatic (implantable) cardiac defibrillator: Secondary | ICD-10-CM

## 2019-03-11 DIAGNOSIS — I2510 Atherosclerotic heart disease of native coronary artery without angina pectoris: Secondary | ICD-10-CM

## 2019-03-11 DIAGNOSIS — I5042 Chronic combined systolic (congestive) and diastolic (congestive) heart failure: Secondary | ICD-10-CM | POA: Diagnosis not present

## 2019-03-11 DIAGNOSIS — Z86718 Personal history of other venous thrombosis and embolism: Secondary | ICD-10-CM

## 2019-03-11 NOTE — Progress Notes (Signed)
No ICM remote transmission received for 03/02/2019 and next ICM transmission scheduled for 03/30/2019.

## 2019-03-11 NOTE — Patient Instructions (Signed)
Medication Instructions:  The current medical regimen is effective;  continue present plan and medications.  *If you need a refill on your cardiac medications before your next appointment, please call your pharmacy*  Follow-Up: At [facility], you and your health needs are our priority.  As part of our continuing mission to provide you with exceptional heart care, we have created designated Provider Care Teams.  These Care Teams include your primary Cardiologist (physician) and Advanced Practice Providers (APPs -  Physician Assistants and Nurse Practitioners) who all work together to provide you with the care you need, when you need it.  We recommend signing up for the patient portal called "MyChart".  Sign up information is provided on this After Visit Summary.  MyChart is used to connect with patients for Virtual Visits (Telemedicine).  Patients are able to view lab/test results, encounter notes, upcoming appointments, etc.  Non-urgent messages can be sent to your provider as well.   To learn more about what you can do with MyChart, go to [email or web address].    Your next appointment:   3 month(s)  The format for your next appointment:   In Person  Provider:   [name], PA  And Dr Marlou Porch in 6 months  Thank you for choosing [facility]!!

## 2019-03-11 NOTE — Progress Notes (Signed)
Cardiology Office Note:    Date:  03/11/2019   ID:  [name], DOB 03-26-1950, MRN [id number]  PCP:  Everardo Beals, NP  Cardiologist:  Candee Furbish, MD  Electrophysiologist:  Thompson Grayer, MD   Referring MD: Everardo Beals, NP     History of Present Illness:    [name] is a 69 y.o. female with recent diabetic ketoacidosis hospitalized on 212/21 shortly after our office visit here for follow-up.  Had acute kidney injury, glucose of as high as 500.  Was rehydrated.  Improved significantly.  Was also treated for Proteus UTI.    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  Xarelto for anticoagulation given bilateral lower extremity DVT  Cardiac catheterization 01/19/2019: catheterization on 01/19/2019:  Severe diffuse three-vessel coronary disease with functional total occlusion of the mid LAD, total occlusion of the large first diagonal, total occlusion of the proximal ramus intermedius, total occlusion of the proximal circumflex, and total occlusion of the mid RCA. All vessels are heavily calcified.  Patent saphenous vein graft to the PDA. Moderate diffuse disease beyond the graft insertion site  Patent sequential saphenous vein graft to the first and second obtuse marginal. Diffuse native vessel disease beyond the bypass graft insertion site.  Patent saphenous vein graft to the ramus intermedius. Diffuse native vessel disease beyond the graft insertion site.  Patent left internal mammary graft to the dominant diagonal. Diffuse native vessel disease beyond the graft insertion site.  Chronic systolic heart failure with EF less than 35% and LVEDP 22 mmHg.  RECOMMENDATIONS:   Guideline directed therapy for systolic heart failure.    Last EF 50%   Overall been stable, in wheelchair currently.  No breathing issues.  Her blood pressure is much  better.  Son is with her today.  No chest pain, no shortness of breath  Past Medical History:  Diagnosis Date  . AICD (automatic cardioverter/defibrillator) present   . Anxiety   . Arthritis   . Back pain   . Bundle branch block 05/2016  . Chronic combined systolic and diastolic CHF, NYHA class 3 ([place])   . Depression   . Dermal mycosis   . Diabetes mellitus    type II  . DVT (deep venous thrombosis) ([place]) 09/2015   bilateral  . Edema, lower extremity   . Glaucoma   . Hyperlipidemia   . Hypertension   . Insomnia   . Ischemic cardiomyopathy    EF 30-35%, s/p ICD 4/08 by Dr Leonia Reeves  . Joint pain   . Morbid obesity ([place]) 11/06/2012  . Obesity   . Oxygen dependent 2 L  . Pain in left knee   . Peripheral neuropathy   . 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 Sleep apnea    uses O2 at night  . Tinea   . Uncontrolled diabetes mellitus ([place]) 02/2019  . Ventricular tachycardia ([place]) 01/16/15   sustained VT terminated with ATP, CL 340 msec    Past Surgical History:  Procedure Laterality Date  . BI-VENTRICULAR IMPLANTABLE CARDIOVERTER DEFIBRILLATOR UPGRADE N/A 09/25/2012   Procedure: BI-VENTRICULAR IMPLANTABLE CARDIOVERTER DEFIBRILLATOR UPGRADE;  Surgeon: Coralyn Bryanah Sidell, MD;  Location: [facility] CATH LAB;  Service: Cardiovascular;  Laterality: N/A;  . BREAST BIOPSY Right 04/05/2014  . CARDIAC CATHETERIZATION    .  CARDIAC DEFIBRILLATOR PLACEMENT  4/08   by Dr Leonia Reeves (MDT)  . COLONOSCOPY WITH PROPOFOL N/A 10/12/2016   Procedure: COLONOSCOPY WITH PROPOFOL;  Surgeon: Doran Stabler, MD;  Location: [facility] ENDOSCOPY;  Service: Gastroenterology;  Laterality: N/A;  . CORONARY ARTERY BYPASS GRAFT  10/05/2005   by Dr Cyndia Bent  . IMPLANTABLE CARDIOVERTER DEFIBRILLATOR IMPLANT  09/25/12   attempt of upgrade to CRT-D unsuccessful due to CS anatomy, SJM Unify [place]aura device placed with LV port capped by Dr Rayann Heman  .  IR GENERIC HISTORICAL  10/03/2015   IR US GUIDE VASC ACCESS LEFT 10/03/2015 Jacqulynn Cadet, MD [facility]  . IR GENERIC HISTORICAL  10/03/2015   IR VENO/EXT/UNI LEFT 10/03/2015 Jacqulynn Cadet, MD [facility]  . IR GENERIC HISTORICAL  10/03/2015   IR VENOCAVAGRAM IVC 10/03/2015 Jacqulynn Cadet, MD [facility]  . IR GENERIC HISTORICAL  10/03/2015   IR INFUSION THROMBOL VENOUS INITIAL (MS) 10/03/2015 Jacqulynn Cadet, MD [facility]  . IR GENERIC HISTORICAL  10/03/2015   IR US GUIDE VASC ACCESS LEFT 10/03/2015 Jacqulynn Cadet, MD [facility]  . IR GENERIC HISTORICAL  10/04/2015   IR THROMBECT VENO MECH MOD SED 10/04/2015 Sandi Mariscal, MD [facility]  . IR GENERIC HISTORICAL  10/04/2015   IR TRANSCATH PLC STENT 1ST ART NOT LE CV CAR VERT CAR 10/04/2015 Sandi Mariscal, MD [facility]  . IR GENERIC HISTORICAL  10/04/2015   IR THROMB F/U EVAL ART/VEN FINAL DAY (MS) 10/04/2015 Sandi Mariscal, MD [facility]  . IR GENERIC HISTORICAL  11/02/2015   IR RADIOLOGIST EVAL & MGMT 11/02/2015 Sandi Mariscal, MD [facility]  . IR RADIOLOGIST EVAL & MGMT  04/26/2016  . LEFT HEART CATH AND CORS/GRAFTS ANGIOGRAPHY N/A 01/19/2019   Procedure: LEFT HEART CATH AND CORS/GRAFTS ANGIOGRAPHY;  Surgeon: Belva Crome, MD;  Location: [facility];  Service: Cardiovascular;  Laterality: N/A;    Current Medications: Current Meds  Medication Sig  . albuterol (PROVENTIL HFA;VENTOLIN HFA) 108 (90 BASE) MCG/ACT inhaler Inhale 2 puffs into the lungs every 6 (six) hours as needed for wheezing or shortness of breath.  Marland Kitchen albuterol (PROVENTIL) (2.5 MG/3ML) 0.083% nebulizer solution Take 2.5 mg by nebulization every 6 (six) hours as needed for wheezing or shortness of breath.   Marland Kitchen amitriptyline (ELAVIL) 10 MG tablet Take 10 mg by mouth daily.  . Capsaicin (MUSCLE RELIEF EX) Apply 1 application topically daily. For pain  . carvedilol (COREG) 6.25 MG tablet Take 1 tablet (6.25 mg total) by mouth 2 (two) times daily with a meal.   . cephALEXin (KEFLEX) 500 MG capsule Take 1 capsule (500 mg total) by mouth every 12 (twelve) hours.  . ferrous sulfate 325 (65 FE) MG EC tablet Take 325 mg by mouth daily.   Marland Kitchen glipiZIDE (GLUCOTROL) 5 MG tablet Take 5 mg by mouth daily before breakfast.   . latanoprost (XALATAN) 0.005 % ophthalmic solution Place 1 drop into both eyes at bedtime.  Marland Kitchen LEVEMIR FLEXTOUCH 100 UNIT/ML Pen Inject 15 Units into the skin 2 (two) times daily.   . methocarbamol (ROBAXIN) 500 MG tablet Take 1 tablet (500 mg total) by mouth 2 (two) times daily as needed for muscle spasms.  . nitroGLYCERIN (NITROSTAT) 0.4 MG SL tablet Place 0.4 mg under the tongue every 5 (five) minutes as needed for chest pain.  Marland Kitchen NOVOLOG FLEXPEN 100 UNIT/ML FlexPen Inject 6 Units into the skin 3 (three) times daily with meals.  . rosuvastatin (CRESTOR) 20 MG tablet Take 1  tablet (20 mg total) by mouth daily at 6 PM. (Patient taking differently: Take 20 mg by mouth daily. )  . Semaglutide (OZEMPIC, 0.25 OR 0.5 MG/DOSE, ) Inject 0.5 mg into the skin every Thursday.   . traMADol (ULTRAM) 50 MG tablet Take 50 mg by mouth 2 (two) times daily.  . Vitamin D, Ergocalciferol, (DRISDOL) 1.25 MG (50000 UT) CAPS capsule Take 1 capsule (50,000 Units total) by mouth every 7 (seven) days. (Patient taking differently: Take 50,000 Units by mouth every Thursday. )  . XARELTO 20 MG TABS tablet Take 20 mg by mouth daily.      Allergies:   Lipitor  [atorvastatin calcium], Metformin and related, Atorvastatin, and Levofloxacin   Social History   Socioeconomic History  . Marital status: Legally Separated    Spouse name: Not on file  . Number of children: 3  . Years of education: 57  . Highest education level: Not on file  Occupational History  . Occupation: retired    Fish farm manager: UNEMPLOYED  Tobacco Use  . Smoking status: Never Smoker  . Smokeless tobacco: Never Used  Substance and Sexual Activity  . Alcohol use: No  . Drug use: No  . Sexual  activity: Not Currently  Other Topics Concern  . Not on file  Social History Narrative   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   11/04/15 Lives with son    caffeine - coffee, 1-2 cups daily   Social Determinants of Health   Financial Resource Strain:   . Difficulty of Paying Living Expenses: Not on file  Food Insecurity:   . Worried About Charity fundraiser in the Last Year: Not on file  . Ran Out of Food in the Last Year: Not on file  Transportation Needs:   . Lack of Transportation (Medical): Not on file  . Lack of Transportation (Non-Medical): Not on file  Physical Activity:   . Days of Exercise per Week: Not on file  . Minutes of Exercise per Session: Not on file  Stress:   . Feeling of Stress : Not on file  Social Connections:   . Frequency of Communication with Friends and Family: Not on file  . Frequency of Social Gatherings with Friends and Family: Not on file  . Attends Religious Services: Not on file  . Active Member of Clubs or Organizations: Not on file  . Attends Archivist Meetings: Not on file  . Marital Status: Not on file     Family History: The patient's family history includes Diabetes (age of onset: 91) in her mother; High blood pressure in her mother; Obesity in her mother; Other in an other family member; Stroke in her mother; Sudden death in her mother.  ROS:   Please see the history of present illness.     All other systems reviewed and are negative.  EKGs/Labs/Other Studies Reviewed:    The following studies were reviewed today: Echo-50% Cath as above-multivessel CAD medical management  EKG:  EKG is not ordered today.    Recent Labs: 01/19/2019: B Natriuretic Peptide 533.7; Magnesium 2.3 02/16/2019: TSH 1.380 02/19/2019: ALT 42 02/22/2019: BUN 12; Creatinine, Ser 1.10; Hemoglobin 9.9; Platelets 169; Potassium 4.4; Sodium 131  Recent Lipid Panel    Component Value Date/Time   CHOL 111 11/03/2018 1205    CHOL 90 12/22/2012 1042   TRIG 87 11/03/2018 1205   TRIG 85 12/22/2012 1042   HDL 30 (L) 11/03/2018 1205   HDL 33 (  L) 12/22/2012 1042   CHOLHDL 3.6 08/05/2017 1048   CHOLHDL 4.2 04/25/2012 0520   VLDL 23 04/25/2012 0520   LDLCALC 64 11/03/2018 1205   LDLCALC 40 12/22/2012 1042    Physical Exam:    VS:  BP 124/72   Pulse 76   Ht 5\' 3"  (1.6 m)   Wt 226 lb (102.5 kg)   SpO2 95%   BMI 40.03 kg/m     Wt Readings from Last 3 Encounters:  03/11/19 226 lb (102.5 kg)  02/20/19 226 lb 6.6 oz (102.7 kg)  02/19/19 224 lb (101.6 kg)     GEN:  Well nourished, well developed in no acute distress, sitting in wheelchair, overweight HEENT: Normal NECK: No JVD; No carotid bruits LYMPHATICS: No lymphadenopathy CARDIAC: RRR, no murmurs, rubs, gallops RESPIRATORY:  Clear to auscultation without rales, wheezing or rhonchi  ABDOMEN: Soft, non-tender, non-distended MUSCULOSKELETAL: Legs with stockings in place, mild edema chronic; No deformity  SKIN: Warm and dry NEUROLOGIC:  Alert and oriented x 3 PSYCHIATRIC:  Normal affect   ASSESSMENT:    1. Chronic combined systolic and diastolic heart failure ([place])   2. Coronary artery disease involving native coronary artery of native heart without angina pectoris   3. History of DVT (deep vein thrombosis)   4. ICD (implantable cardioverter-defibrillator) in place    PLAN:    In order of problems listed above:  Chronic combined systolic and diastolic heart failure -Blood pressure improved today 124/72.  When I saw her last it was 90/50 but she was in the throes of DKA. -No longer on Lasix. -Acute kidney injury in the hospital setting improved creatinine from 2 down to 1.08 -ICD in place to monitor fluid balance  Prior DVT -Continue with Xarelto  Diabetes difficult to control -On insulin.  Continue close follow-up with Everardo Beals, NP.  Morbid obesity -Continue to encourage weight loss  We will see her back in 3 months, [name], 6 months me   Medication Adjustments/Labs and Tests Ordered: Current medicines are reviewed at length with the patient today.  Concerns regarding medicines are outlined above.  No orders of the defined types were placed in this encounter.  No orders of the defined types were placed in this encounter.   Patient Instructions  Medication Instructions:  The current medical regimen is effective;  continue present plan and medications.  *If you need a refill on your cardiac medications before your next appointment, please call your pharmacy*  Follow-Up: At [facility], you and your health needs are our priority.  As part of our continuing mission to provide you with exceptional heart care, we have created designated Provider Care Teams.  These Care Teams include your primary Cardiologist (physician) and Advanced Practice Providers (APPs -  Physician Assistants and Nurse Practitioners) who all work together to provide you with the care you need, when you need it.  We recommend signing up for the patient portal called "MyChart".  Sign up information is provided on this After Visit Summary.  MyChart is used to connect with patients for Virtual Visits (Telemedicine).  Patients are able to view lab/test results, encounter notes, upcoming appointments, etc.  Non-urgent messages can be sent to your provider as well.   To learn more about what you can do with MyChart, go to [email or web address].    Your next appointment:   3 month(s)  The format for your next appointment:   In Person  Provider:   [name], PA  And Dr  Sulma Ruffino in 6 months  Thank you for choosing [facility]!!        Signed, Candee Furbish, MD  03/11/2019 4:05 PM    [place]

## 2019-03-31 ENCOUNTER — Telehealth: Payer: Self-pay

## 2019-03-31 NOTE — Telephone Encounter (Signed)
Left message for patient to remind of missed remote transmission.  

## 2019-04-16 ENCOUNTER — Other Ambulatory Visit: Payer: Self-pay

## 2019-04-17 NOTE — Progress Notes (Signed)
No ICM remote transmission received for 03/30/2019 and next ICM transmission scheduled for 04/27/2019.

## 2019-04-20 ENCOUNTER — Ambulatory Visit (INDEPENDENT_AMBULATORY_CARE_PROVIDER_SITE_OTHER): Payer: Medicare HMO | Admitting: Internal Medicine

## 2019-04-20 ENCOUNTER — Encounter: Payer: Self-pay | Admitting: Internal Medicine

## 2019-04-20 ENCOUNTER — Other Ambulatory Visit: Payer: Self-pay

## 2019-04-20 VITALS — BP 146/82 | HR 101 | Temp 98.6°F | Ht 63.0 in | Wt 215.2 lb

## 2019-04-20 DIAGNOSIS — E119 Type 2 diabetes mellitus without complications: Secondary | ICD-10-CM | POA: Insufficient documentation

## 2019-04-20 DIAGNOSIS — E1165 Type 2 diabetes mellitus with hyperglycemia: Secondary | ICD-10-CM

## 2019-04-20 DIAGNOSIS — Z794 Long term (current) use of insulin: Secondary | ICD-10-CM | POA: Diagnosis not present

## 2019-04-20 DIAGNOSIS — E785 Hyperlipidemia, unspecified: Secondary | ICD-10-CM | POA: Diagnosis not present

## 2019-04-20 DIAGNOSIS — E1159 Type 2 diabetes mellitus with other circulatory complications: Secondary | ICD-10-CM

## 2019-04-20 HISTORY — DX: Type 2 diabetes mellitus with hyperglycemia: E11.65

## 2019-04-20 LAB — POCT GLYCOSYLATED HEMOGLOBIN (HGB A1C): Hemoglobin A1C: 7.7 % — AB (ref 4.0–5.6)

## 2019-04-20 MED ORDER — NOVOLOG FLEXPEN 100 UNIT/ML ~~LOC~~ SOPN: 10.0000 [IU] | PEN_INJECTOR | Freq: Three times a day (TID) | SUBCUTANEOUS | 3 refills | Status: DC

## 2019-04-20 MED ORDER — FREESTYLE LIBRE 14 DAY SENSOR MISC: 1.0000 | 11 refills | Status: DC

## 2019-04-20 MED ORDER — FREESTYLE LIBRE 14 DAY READER DEVI: 1.0000 | 0 refills | Status: DC

## 2019-04-20 MED ORDER — TRESIBA FLEXTOUCH 100 UNIT/ML ~~LOC~~ SOPN: 26.0000 [IU] | PEN_INJECTOR | Freq: Every day | SUBCUTANEOUS | 3 refills | Status: DC

## 2019-04-20 MED ORDER — INSULIN PEN NEEDLE 32G X 4 MM MISC: 1.0000 | Freq: Four times a day (QID) | 3 refills | Status: DC

## 2019-04-20 NOTE — Patient Instructions (Addendum)
-   STOP Levemir  - Start Tresiba at 26 units ONCE  Daily  - Continue Novolog 10 units with each meal  - Continue Ozempic 0.5 mg once weekly      - Check sugar before each meal    -Choose healthy, lower carb lower calorie snacks: toss salad,  vegetables, cottage cheese, peanut butter, low fat cheese / string cheese, lower sodium deli meat, tuna salad or chicken salad     HOW TO TREAT LOW BLOOD SUGARS (Blood sugar LESS THAN 70 MG/DL)  Please follow the RULE OF 15 for the treatment of hypoglycemia treatment (when your (blood sugars are less than 70 mg/dL)    STEP 1: Take 15 grams of carbohydrates when your blood sugar is low, which includes:   3-4 GLUCOSE TABS  OR  3-4 OZ OF JUICE OR REGULAR SODA OR  ONE TUBE OF GLUCOSE GEL     STEP 2: RECHECK blood sugar in 15 MINUTES STEP 3: If your blood sugar is still low at the 15 minute recheck --> then, go back to STEP 1 and treat AGAIN with another 15 grams of carbohydrates.

## 2019-04-20 NOTE — Progress Notes (Signed)
Name: [name]  MRN/ DOB: [id number], 1950-08-16   Age/ Sex: 69 y.o., female    PCP: Everardo Beals, NP   Reason for Endocrinology Evaluation: Type 2 Diabetes Mellitus     Date of Initial Endocrinology Visit: 04/20/2019     PATIENT IDENTIFIER: [name] is a 70 y.o. female with a past medical history of DM, HTN, Dyslipidemia and CAD. The patient presented for initial endocrinology clinic visit on 04/20/2019 for consultative assistance with her diabetes management.    HPI: Ms. Gainous was     Diagnosed with DM years ago Prior Medications tried/Intolerance: Glipizide , metformin- Intolerant  Currently checking blood sugars 4 x / day,  before and after meals  Hypoglycemia episodes : yes               Symptoms: woozy               Frequency: 2/ month  Hemoglobin A1c has ranged from 7.8% in 2017, peaking at 9.5% in 2014. Patient required assistance for hypoglycemia:  Patient has required hospitalization within the last 1 year from hyper or hypoglycemia: yes 02/2019  In terms of diet, the patient eats 3 meals a day, snacks 1-2x a day. Avoids sugar sweetened beverages.    HOME DIABETES REGIMEN: Levemir 15 units BID  Novolog 10 units TID  Ozempic 0.5 mg weekly ( Thursday)    Statin: yes ACE-I/ARB: no Prior Diabetic Education: long time a ago    GLUCOSE LOG:  24- 495 mg/dL    DIABETIC COMPLICATIONS: Microvascular complications:    Denies: CKD, neuropathy, retinopathy   Last eye exam: Completed 2019  Macrovascular complications:   CAD , CHF  Denies: PVD, CVA   PAST HISTORY: Past Medical History:  Past Medical History:  Diagnosis Date  . AICD (automatic cardioverter/defibrillator) present   . Anxiety   . Arthritis   . Back pain   . Bundle branch block 05/2016  . Chronic combined systolic and diastolic CHF, NYHA class 3 ([place])   . Depression   . Dermal mycosis   . Diabetes mellitus    type II  . DVT (deep venous thrombosis) ([place]) 09/2015    bilateral  . Edema, lower extremity   . Glaucoma   . Hyperlipidemia   . Hypertension   . Insomnia   . Ischemic cardiomyopathy    EF 30-35%, s/p ICD 4/08 by Dr Leonia Reeves  . Joint pain   . Morbid obesity ([place]) 11/06/2012  . Obesity   . Oxygen dependent 2 L  . Pain in left knee   . Peripheral neuropathy   . 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 Sleep apnea    uses O2 at night  . Tinea   . Uncontrolled diabetes mellitus ([place]) 02/2019  . Ventricular tachycardia ([place]) 01/16/15   sustained VT terminated with ATP, CL 340 msec   Past Surgical History:  Past Surgical History:  Procedure Laterality Date  . BI-VENTRICULAR IMPLANTABLE CARDIOVERTER DEFIBRILLATOR UPGRADE N/A 09/25/2012   Procedure: BI-VENTRICULAR IMPLANTABLE CARDIOVERTER DEFIBRILLATOR UPGRADE;  Surgeon: Coralyn Mark, MD;  Location: [facility] CATH LAB;  Service: Cardiovascular;  Laterality: N/A;  . BREAST BIOPSY Right 04/05/2014  . CARDIAC CATHETERIZATION    . CARDIAC DEFIBRILLATOR PLACEMENT  4/08   by Dr Leonia Reeves (MDT)  . COLONOSCOPY WITH PROPOFOL N/A 10/12/2016   Procedure: COLONOSCOPY WITH PROPOFOL;  Surgeon: Doran Stabler, MD;  Location: [facility];  Service: Gastroenterology;  Laterality: N/A;  . CORONARY ARTERY BYPASS GRAFT  10/05/2005   by Dr Cyndia Bent  . IMPLANTABLE CARDIOVERTER DEFIBRILLATOR IMPLANT  09/25/12   attempt of upgrade to CRT-D unsuccessful due to CS anatomy, SJM Unify [place] device placed with LV port capped by Dr Rayann Heman  . IR GENERIC HISTORICAL  10/03/2015   IR US GUIDE VASC ACCESS LEFT 10/03/2015 Jacqulynn Cadet, MD [facility]  . IR GENERIC HISTORICAL  10/03/2015   IR VENO/EXT/UNI LEFT 10/03/2015 Jacqulynn Cadet, MD [facility]  . IR GENERIC HISTORICAL  10/03/2015   IR VENOCAVAGRAM IVC 10/03/2015 Jacqulynn Cadet, MD [facility]  . IR GENERIC HISTORICAL  10/03/2015   IR INFUSION THROMBOL VENOUS INITIAL (MS)  10/03/2015 Jacqulynn Cadet, MD [facility]  . IR GENERIC HISTORICAL  10/03/2015   IR US GUIDE VASC ACCESS LEFT 10/03/2015 Jacqulynn Cadet, MD [facility]  . IR GENERIC HISTORICAL  10/04/2015   IR THROMBECT VENO MECH MOD SED 10/04/2015 Sandi Mariscal, MD [facility]  . IR GENERIC HISTORICAL  10/04/2015   IR TRANSCATH PLC STENT 1ST ART NOT LE CV CAR Tran CAR 10/04/2015 Sandi Mariscal, MD [facility]  . IR GENERIC HISTORICAL  10/04/2015   IR THROMB F/U EVAL ART/VEN FINAL DAY (MS) 10/04/2015 Sandi Mariscal, MD [facility]  . IR GENERIC HISTORICAL  11/02/2015   IR RADIOLOGIST EVAL & MGMT 11/02/2015 Sandi Mariscal, MD [facility]  . IR RADIOLOGIST EVAL & MGMT  04/26/2016  . LEFT HEART CATH AND CORS/GRAFTS ANGIOGRAPHY N/A 01/19/2019   Procedure: LEFT HEART CATH AND CORS/GRAFTS ANGIOGRAPHY;  Surgeon: Belva Crome, MD;  Location: [facility];  Service: Cardiovascular;  Laterality: N/A;      Social History:  reports that she has never smoked. She has never used smokeless tobacco. She reports that she does not drink alcohol or use drugs. Family History:  Family History  Problem Relation Age of Onset  . Diabetes Mother 95       died - HTN  . Stroke Mother   . High blood pressure Mother   . Sudden death Mother   . Obesity Mother   . Other Other        No early family hx of CAD     HOME MEDICATIONS: Allergies as of 04/20/2019      Reactions   Lipitor  [atorvastatin Calcium] Rash   Metformin And Related Itching, Swelling, Other (See Comments)   Leg pain & swelling in legs   Atorvastatin Itching, Rash   Levofloxacin Itching      Medication List       Accurate as of April 20, 2019 11:21 AM. If you have any questions, ask your nurse or doctor.        STOP taking these medications   glipiZIDE 5 MG tablet Commonly known as: GLUCOTROL Stopped by: Dorita Sciara, MD     TAKE these medications   albuterol 108 (90 Base) MCG/ACT inhaler Commonly known as: VENTOLIN HFA Inhale 2  puffs into the lungs every 6 (six) hours as needed for wheezing or shortness of breath.   albuterol (2.5 MG/3ML) 0.083% nebulizer solution Commonly known as: PROVENTIL Take 2.5 mg by nebulization every 6 (six) hours as needed for wheezing or shortness of breath.   amitriptyline 10 MG tablet Commonly known as: ELAVIL Take 10 mg by mouth daily.   carvedilol 6.25 MG tablet Commonly known as: COREG Take 1 tablet (  6.25 mg total) by mouth 2 (two) times daily with a meal.   cephALEXin 500 MG capsule Commonly known as: KEFLEX Take 1 capsule (500 mg total) by mouth every 12 (twelve) hours.   ferrous sulfate 325 (65 FE) MG EC tablet Take 325 mg by mouth daily.   latanoprost 0.005 % ophthalmic solution Commonly known as: XALATAN Place 1 drop into both eyes at bedtime.   Levemir FlexTouch 100 UNIT/ML FlexPen Generic drug: insulin detemir Inject 15 Units into the skin 2 (two) times daily.   methocarbamol 500 MG tablet Commonly known as: ROBAXIN Take 1 tablet (500 mg total) by mouth 2 (two) times daily as needed for muscle spasms.   MUSCLE RELIEF EX Apply 1 application topically daily. For pain   nitroGLYCERIN 0.4 MG SL tablet Commonly known as: NITROSTAT Place 0.4 mg under the tongue every 5 (five) minutes as needed for chest pain.   NovoLOG FlexPen 100 UNIT/ML FlexPen Generic drug: insulin aspart Inject 6 Units into the skin 3 (three) times daily with meals.   OZEMPIC (0.25 OR 0.5 MG/DOSE) [place] Inject 0.5 mg into the skin every Thursday.   rosuvastatin 20 MG tablet Commonly known as: CRESTOR Take 1 tablet (20 mg total) by mouth daily at 6 PM. What changed: when to take this   traMADol 50 MG tablet Commonly known as: ULTRAM Take 50 mg by mouth 2 (two) times daily.   Vitamin D (Ergocalciferol) 1.25 MG (50000 UNIT) Caps capsule Commonly known as: DRISDOL Take 1 capsule (50,000 Units total) by mouth every 7 (seven) days. What changed: when to take this   Xarelto 20 MG Tabs  tablet Generic drug: rivaroxaban Take 20 mg by mouth daily.        ALLERGIES: Allergies  Allergen Reactions  . Lipitor  [Atorvastatin Calcium] Rash  . Metformin And Related Itching, Swelling and Other (See Comments)    Leg pain & swelling in legs  . Atorvastatin Itching and Rash  . Levofloxacin Itching     REVIEW OF SYSTEMS: A comprehensive ROS was conducted with the patient and is negative except as per HPI and below:  Review of Systems  Gastrointestinal: Positive for nausea. Negative for diarrhea and vomiting.  Musculoskeletal: Positive for joint pain.  Neurological: Negative for tingling.      OBJECTIVE:   VITAL SIGNS: BP (!) 146/82 (BP Location: Left Arm, Patient Position: Sitting, Cuff Size: Large)   Pulse (!) 101   Temp 98.6 F (37 C)   Ht 5\' 3"  (1.6 m)   Wt 215 lb 3.2 oz (97.6 kg)   SpO2 98%   BMI 38.12 kg/m    PHYSICAL EXAM:  General: Pt appears well and is in NAD  Lungs: Clear with good BS bilat with no rales, rhonchi, or wheezes  Heart: RRR with normal S1 and S2 and no gallops; no murmurs; no rub  Abdomen: Normoactive bowel sounds, soft, nontender, without masses or organomegaly palpable  Extremities:  Lower extremities - 1+ pretibial edema.   Skin: Normal texture and temperature to palpation.   Neuro: MS is good with appropriate affect, pt is alert and Ox3    DM foot exam: deferred- pt with compression stockings   DATA REVIEWED:  Lab Results  Component Value Date   HGBA1C 7.7 (A) 04/20/2019   HGBA1C 8.3 (H) 01/13/2019   HGBA1C 8.2 (H) 11/03/2018   Lab Results  Component Value Date   LDLCALC 64 11/03/2018   CREATININE 1.10 (H) 02/22/2019   No results found for:  MICRALBCREAT  Lab Results  Component Value Date   CHOL 111 11/03/2018   HDL 30 (L) 11/03/2018   LDLCALC 64 11/03/2018   TRIG 87 11/03/2018   CHOLHDL 3.6 08/05/2017        ASSESSMENT / PLAN / RECOMMENDATIONS:   1) Type 2 Diabetes Mellitus, Poorly controlled, With  Macrovascular complications - Most recent A1c of 7.7%. Goal A1c < 7.0 %.    Plan: GENERAL:  Praised the pt on improved glycemic control  I have discussed with the patient the pathophysiology of diabetes. We went over the natural progression of the disease. We talked about both insulin resistance and insulin deficiency. We stressed the importance of lifestyle changes including diet and exercise. I explained the complications associated with diabetes including retinopathy, nephropathy, neuropathy as well as increased risk of cardiovascular disease. We went over the benefit seen with glycemic control.    I explained to the patient that diabetic patients are at higher than normal risk for amputations.  Pt with fluctuating BG's part of this is insulin to carb mismatch. I have advised her against taking prandial insulin post-prandial and to take it with her meal.   I am going to change Levemir to [place] , to reduce the amount of injections.   I am also going to write a prescription for Freestyle libre to see if her insurance would cover.   I have encouraged her to avoid sugar-sweetened beverages and snacks, we discussed options for low carb snack when needed.   MEDICATIONS:  - STOP Levemir  - Start Tresiba at 26 units ONCE  Daily  - Continue Novolog 10 units with each meal  - Continue Ozempic 0.5 mg once weekly    EDUCATION / INSTRUCTIONS:  BG monitoring instructions: Patient is instructed to check her blood sugars 4 times a day, before meals and bedtime  Call [place] [facility] if: BG persistently < 70 or > 300. . I reviewed the Rule of 15 for the treatment of hypoglycemia in detail with the patient. Literature supplied.   2) Diabetic complications:   Eye: Does not have known diabetic retinopathy.   Neuro/ Feet: Does not have known diabetic peripheral neuropathy.  Renal: Patient does not have known baseline CKD. She is not on an ACEI/ARB at present.  3) Lipids: Patient  is on rosuvastatin. Discussed the cardiovascular benefits    F/U in 3 months    Signed electronically by: Mack Guise, MD  [facility]  [facility] [address] [place], [place] [address] Phone: [phone] FAX: [phone]   CC: Everardo Beals, [place] [address] Phone: [phone]  Fax: [phone]    Return to Endocrinology clinic as below: Future Appointments  Date Time Provider Platter  04/24/2019  7:00 AM [facility] DEVICE REMOTES [facility]  06/29/2019  3:45 PM [name] T, PA-C [facility]  07/24/2019  7:00 AM [facility] DEVICE REMOTES [facility]  10/23/2019  7:00 AM [facility] DEVICE REMOTES [facility]

## 2019-04-24 ENCOUNTER — Ambulatory Visit (INDEPENDENT_AMBULATORY_CARE_PROVIDER_SITE_OTHER): Payer: Medicare HMO | Admitting: *Deleted

## 2019-04-24 DIAGNOSIS — Z95810 Presence of automatic (implantable) cardiac defibrillator: Secondary | ICD-10-CM | POA: Diagnosis not present

## 2019-04-24 LAB — CUP PACEART REMOTE DEVICE CHECK
Battery Remaining Longevity: 36 mo
Battery Remaining Percentage: 34 %
Battery Voltage: 2.89 V
Brady Statistic AP VP Percent: 1 %
Brady Statistic AP VS Percent: 1.1 %
Brady Statistic AS VP Percent: 1 %
Brady Statistic AS VS Percent: 99 %
Brady Statistic RA Percent Paced: 1 %
Brady Statistic RV Percent Paced: 1 %
Date Time Interrogation Session: 20210416114159
HighPow Impedance: 36 Ohm
HighPow Impedance: 36 Ohm
Implantable Lead Implant Date: 20080423
Implantable Lead Implant Date: 20080423
Implantable Lead Implant Date: 20140918
Implantable Lead Location: 753858
Implantable Lead Location: 753859
Implantable Lead Location: 753860
Implantable Lead Model: 5076
Implantable Lead Model: 6947
Implantable Pulse Generator Implant Date: 20140918
Lead Channel Impedance Value: 390 Ohm
Lead Channel Impedance Value: 450 Ohm
Lead Channel Pacing Threshold Amplitude: 0.5 V
Lead Channel Pacing Threshold Amplitude: 0.75 V
Lead Channel Pacing Threshold Pulse Width: 0.5 ms
Lead Channel Pacing Threshold Pulse Width: 0.5 ms
Lead Channel Sensing Intrinsic Amplitude: 1.1 mV
Lead Channel Sensing Intrinsic Amplitude: 12 mV
Lead Channel Setting Pacing Amplitude: 2 V
Lead Channel Setting Pacing Amplitude: 2.5 V
Lead Channel Setting Pacing Pulse Width: 0.5 ms
Lead Channel Setting Sensing Sensitivity: 0.5 mV
Pulse Gen Serial Number: 7070007

## 2019-04-24 NOTE — Progress Notes (Signed)
ICD Remote  

## 2019-04-27 ENCOUNTER — Ambulatory Visit (INDEPENDENT_AMBULATORY_CARE_PROVIDER_SITE_OTHER): Payer: Medicare HMO

## 2019-04-27 DIAGNOSIS — Z95810 Presence of automatic (implantable) cardiac defibrillator: Secondary | ICD-10-CM | POA: Diagnosis not present

## 2019-04-27 DIAGNOSIS — I5042 Chronic combined systolic (congestive) and diastolic (congestive) heart failure: Secondary | ICD-10-CM

## 2019-04-27 NOTE — Progress Notes (Signed)
EPIC Encounter for ICM Monitoring  Patient Name: [name] is a 68 y.o. female Date: 04/27/2019 Primary Care Physican: Everardo Beals, NP Primary Cardiologist:Skains/Weaver PA Electrophysiologist: Allred 04/27/2019 Weight: 220lbs    Spoke with patient and she is asymptomatic for fluid accumulation.  She stayed a couple of months with her daughter after Feb hospitalization but did not take her monitor with her.  Explained the importance of taking the monitor if she will be gone from home more than a week.    CorVue thoracic impedancesuggesting possible fluid accumulation starting 04/22/19.  No longer prescribed diuretic.  Labs:  01/27/2018 Creatinine 1.22, BUN 22, Potassium 5.3, Sodium 135, GFR 46-53  Recommendations: Recommendation to limit salt intake to 2000 mg daily and fluid intake to 64 oz daily.  Encouraged to call if experiencing any fluid symptoms.   Follow-up plan: ICM clinic phone appointment on4/27/2021 to recheck fluid levels. 91 day device clinic remote transmission 07/24/2019.Office visit with [name], PA on 06/29/2019.  Copy of ICM check sent to Dr.Allred and Dr Marlou Porch for review.   3 month ICM trend: 04/24/2019    1 Year ICM trend:       [name], RN 04/27/2019 1:23 PM

## 2019-05-05 ENCOUNTER — Ambulatory Visit (INDEPENDENT_AMBULATORY_CARE_PROVIDER_SITE_OTHER): Payer: Medicare HMO

## 2019-05-05 DIAGNOSIS — I5042 Chronic combined systolic (congestive) and diastolic (congestive) heart failure: Secondary | ICD-10-CM

## 2019-05-05 DIAGNOSIS — Z95810 Presence of automatic (implantable) cardiac defibrillator: Secondary | ICD-10-CM

## 2019-05-08 ENCOUNTER — Other Ambulatory Visit: Payer: Self-pay | Admitting: *Deleted

## 2019-05-08 ENCOUNTER — Telehealth: Payer: Self-pay

## 2019-05-08 DIAGNOSIS — Z1231 Encounter for screening mammogram for malignant neoplasm of breast: Secondary | ICD-10-CM

## 2019-05-08 NOTE — Telephone Encounter (Signed)
PA for [facility] denied/not covered

## 2019-05-08 NOTE — Progress Notes (Signed)
EPIC Encounter for ICM Monitoring  Patient Name: [name] is a 69 y.o. female Date: 05/08/2019 Primary Care Physican: Everardo Beals, NP Primary Cardiologist:Skains/Weaver PA Electrophysiologist: Allred 04/27/2019 Weight: 220lbs  05/08/2019 Weight: 213 lbs  AT/AF Burden: <1% (taking Xarelto)   Spoke with patient and she is asymptomatic for fluid accumulation.   CorVue thoracic impedancereturned to normal since 04/27/19 remote transmission.  No longer prescribed diuretic.  Labs:  01/27/2018 Creatinine 1.22, BUN 22, Potassium 5.3, Sodium 135, GFR 46-53  Recommendations:Recommendation to limit salt intake to 2000 mg daily and fluid intake to 64 oz daily.  Encouraged to call if experiencing any fluid symptoms.   Follow-up plan: ICM clinic phone appointment on5/24/2021. 91 day device clinic remote transmission 07/24/2019.Office visit with [name], PA on 06/29/2019.  Copy of ICM check sent to Dr.Allred.  3 month ICM trend: 05/05/2019    1 Year ICM trend:       [name], RN 05/08/2019 3:29 PM

## 2019-06-01 ENCOUNTER — Ambulatory Visit (INDEPENDENT_AMBULATORY_CARE_PROVIDER_SITE_OTHER): Payer: Medicare HMO

## 2019-06-01 DIAGNOSIS — Z95810 Presence of automatic (implantable) cardiac defibrillator: Secondary | ICD-10-CM | POA: Diagnosis not present

## 2019-06-01 DIAGNOSIS — I5042 Chronic combined systolic (congestive) and diastolic (congestive) heart failure: Secondary | ICD-10-CM

## 2019-06-05 NOTE — Progress Notes (Signed)
EPIC Encounter for ICM Monitoring  Patient Name: [name] is a 69 y.o. female Date: 06/05/2019 Primary Care Physican: Everardo Beals, NP Primary Cardiologist:Skains/Weaver PA Electrophysiologist: Allred 4/19/2021Weight: 220lbs  05/08/2019 Weight: 213 lbs  AT/AF Burden: <1% (taking Xarelto)   Spoke with patientand she is asymptomaticfor fluid accumulation.   CorVue thoracic impedancenormal but was suggesting possible fluid accumulation from 05/19/2019 - 05/30/2019.  No longer prescribed diuretic.  Labs:  01/27/2018 Creatinine 1.22, BUN 22, Potassium 5.3, Sodium 135, GFR 46-53  Recommendations:Recommendation to limit salt intake to 2000 mg daily and fluid intake to 64 oz daily. Encouraged to call if experiencing any fluid symptoms.   Follow-up plan: ICM clinic phone appointment on6/21/2021 to recheck fluid levels the day of OV. 91 day device clinic remote transmission7/16/2021.Office visit withScott Mikle Bosworth 06/29/2019.  Copy of ICM check sent to Dr.Allred.  3 month ICM trend: 06/01/2019    1 Year ICM trend:       [name], RN 06/05/2019 2:33 PM

## 2019-06-15 ENCOUNTER — Other Ambulatory Visit: Payer: Self-pay

## 2019-06-15 ENCOUNTER — Ambulatory Visit
Admission: RE | Admit: 2019-06-15 | Discharge: 2019-06-15 | Disposition: A | Discharge status: Home / Self Care | Payer: Medicare HMO | Source: Ambulatory Visit | Attending: *Deleted | Admitting: *Deleted

## 2019-06-15 DIAGNOSIS — Z1231 Encounter for screening mammogram for malignant neoplasm of breast: Secondary | ICD-10-CM

## 2019-06-28 NOTE — Progress Notes (Signed)
Cardiology Office Note:    Date:  06/29/2019   ID:  [name], DOB [birth year], MRN [id number]  PCP:  Everardo Beals, NP  Cardiologist:  Candee Furbish, MD    Electrophysiologist:  Thompson Grayer, MD   Referring MD: Everardo Beals, NP   Chief Complaint:  No chief complaint on file.    Patient Profile:    [name] is a 69 y.o. female with:   Coronary artery disease  ? S/p CABG in 2007 ? Cath 01/2019: patent bypass grafts   Systolic CHF ? Followed in [facility] clinic  ? Ischemic CM ? Echocardiogram 3/17: EF 40-45 ? Echocardiogram 01/2019: EF 50  Ventricular tachycardia ? S/p ICD (CRT unsuccessful 2/2 vein sclerosis)  Diabetes mellitus   Hypertension   DVT ? admx 9/17 w/ extensive bilat DVT >> thrombolysis (intravascular tPA, stenting to L CIV/EIV) ? Rivaroxaban Rx  OSA  Prior CV studies: Cardiac catheterization Jan 28, 2019 LAD mid diffuse 80; D1 100 RI 100 LCx prox 100; OM1 mod dz; OM2 mod dz RCA prox 100; RPDA mod dz; RPAV mod dz S-OM1/OM2 patent S-RPDA patent  S-RI patent  L-D1 EF 25-35  Myoview 01/17/2019 Fixed defects in septum, apex, ant wall, post wall and post-lat wall Possible small amount of reversibility in ant septum and ant wall (subtle) EF 20  High risk  Echocardiogram 01/13/2019 EF 50, poss apical WMA, mild LVH, mild MAC  Echo 03/13/15 Mild to mod LVH, EF 40-45%, no RWMA, Gr 1 DD  Myoview 5/16 Findings consistent with prior myocardial infarction. This is a high risk study. The left ventricular ejection fraction is severely decreased (<30%). Defect 1: There is a large defect of severe severity present in the mid inferoseptal and apex location.  [place] 12/11 EF 30-35% L-D2 patent S-RI patent S-OM1/OM2 patent S-PDA patent  History of Present Illness:    Ms. Sherr was admitted in 01/2019 with probable gallstone pancreatitis and suspected DKA.  She had elevated Troponin levels and received 1 shock from her ICD for polymorphic VT.   Cardiac catheterization demonstrated stable CAD with patent grafts.  Med Rx was continued.  She was admitted again in 02/2019 with hyperglycemia and DKA.  She was last seen in clinic by Dr. Marlou Porch in 03/2019.  Her device check with the ischemic cardiomyopathy clinic 06/01/2019 demonstrated normal thoracic impedance but with suggestion of possible fluid accumulation 5/11-5/21.  She has another device check scheduled for today.  She returns for follow up.    She is here alone today.  Her device check earlier today demonstrated decreased thoracic impedance suggesting fluid accumulation.  The patient notes a 3 pound weight gain at home and increasing lower extremity swelling.  She has not really had significant shortness of breath.  She has not had orthopnea or PND.  She has not had chest pain, syncope or ICD shock.  Past Medical History:  Diagnosis Date  . Abnormal LFTs   . AICD (automatic cardioverter/defibrillator) present   . Angina decubitus ([place]) 11/06/2012  . Angina pectoris ([place])   . Anxiety   . Arthritis   . Back pain   . Bundle branch block 05/2016  . CAD (coronary artery disease) 06/18/2010  . CHF (congestive heart failure) ([place])   . Chronic combined systolic and diastolic CHF, NYHA class 3 ([place])   . Chronic combined systolic and diastolic heart failure ([place])   . Chronic systolic dysfunction of left ventricle 09/24/2012  . Coronary artery disease    s/p CABG 2007  .  Depression   . Dermal mycosis   . Diabetes mellitus    type II  . DKA (diabetic ketoacidoses) ([place]) 01/12/2019  . DVT (deep venous thrombosis) ([place]) 09/2015   bilateral  . Edema, lower extremity   . Glaucoma   . Hyperlipidemia   . Hypertension   . Hypoglycemia 09/09/2012  . Insomnia   . Insulin dependent diabetes mellitus ([place]) 04/23/2012  . Ischemic cardiomyopathy    EF 30-35%, s/p ICD 4/08 by Dr Leonia Reeves  . Joint pain   . LBBB (left bundle branch block) 09/24/2012  . Morbid obesity ([place]) 11/06/2012  . Obesity   .  Oxygen dependent 2 L  . Pain in left knee   . Peripheral neuropathy   . 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 Sleep apnea    uses O2 at night  . Tinea   . Type 2 diabetes mellitus with hyperglycemia, with long-term current use of insulin ([place]) 04/20/2019  . Uncontrolled diabetes mellitus ([place]) 02/2019  . Ventricular tachycardia ([place]) 01/16/15   sustained VT terminated with ATP, CL 340 msec  . Vitamin B 12 deficiency 11/05/2018  . Vitamin D deficiency 11/05/2018    Current Medications: Current Meds  Medication Sig  . albuterol (PROVENTIL HFA;VENTOLIN HFA) 108 (90 BASE) MCG/ACT inhaler Inhale 2 puffs into the lungs every 6 (six) hours as needed for wheezing or shortness of breath.  Marland Kitchen albuterol (PROVENTIL) (2.5 MG/3ML) 0.083% nebulizer solution Take 2.5 mg by nebulization every 6 (six) hours as needed for wheezing or shortness of breath.   Marland Kitchen amitriptyline (ELAVIL) 10 MG tablet Take 10 mg by mouth daily.  . Capsaicin (MUSCLE RELIEF EX) Apply 1 application topically daily. For pain  . carvedilol (COREG) 6.25 MG tablet Take 1 tablet (6.25 mg total) by mouth 2 (two) times daily with a meal.  . Continuous Blood Gluc Receiver (FREESTYLE LIBRE 14 DAY READER) DEVI 1 Device by Does not apply route as directed.  . Continuous Blood Gluc Sensor (FREESTYLE LIBRE 14 DAY SENSOR) MISC 1 Device by Does not apply route as directed.  . ferrous sulfate 325 (65 FE) MG EC tablet Take 325 mg by mouth daily.   . insulin degludec (TRESIBA FLEXTOUCH) 100 UNIT/ML FlexTouch Pen Inject 0.26 mLs (26 Units total) into the skin daily.  . Insulin Pen Needle 32G X 4 MM MISC 1 Device by Does not apply route in the morning, at noon, in the evening, and at bedtime.  Marland Kitchen latanoprost (XALATAN) 0.005 % ophthalmic solution Place 1 drop into both eyes at bedtime.  . methocarbamol (ROBAXIN) 500 MG tablet Take 1 tablet (500 mg total) by mouth 2  (two) times daily as needed for muscle spasms.  . nitroGLYCERIN (NITROSTAT) 0.4 MG SL tablet Place 0.4 mg under the tongue every 5 (five) minutes as needed for chest pain.  Marland Kitchen NOVOLOG FLEXPEN 100 UNIT/ML FlexPen Inject 10 Units into the skin 3 (three) times daily with meals.  . rosuvastatin (CRESTOR) 20 MG tablet Take 1 tablet (20 mg total) by mouth daily at 6 PM. (Patient taking differently: Take 20 mg by mouth daily. )  . Semaglutide (OZEMPIC, 0.25 OR 0.5 MG/DOSE, [place]) Inject 0.5 mg into the skin every Thursday.   . traMADol (ULTRAM) 50 MG tablet Take 50 mg by mouth 2 (two) times daily.  . Vitamin D, Ergocalciferol, (DRISDOL) 1.25 MG (50000 UT) CAPS capsule  Take 1 capsule (50,000 Units total) by mouth every 7 (seven) days. (Patient taking differently: Take 50,000 Units by mouth every Thursday. )  . XARELTO 20 MG TABS tablet Take 20 mg by mouth daily.      Allergies:   Lipitor  [atorvastatin calcium], Metformin and related, Atorvastatin, and Levofloxacin   Social History   Tobacco Use  . Smoking status: Never Smoker  . Smokeless tobacco: Never Used  Vaping Use  . Vaping Use: Never used  Substance Use Topics  . Alcohol use: No  . Drug use: No     Family Hx: The patient's family history includes Diabetes (age of onset: 57) in her mother; High blood pressure in her mother; Obesity in her mother; Other in an other family member; Stroke in her mother; Sudden death in her mother.  ROS   EKGs/Labs/Other Test Reviewed:    EKG:  EKG is  ordered today.  The ekg ordered today demonstrates normal sinus rhythm, heart rate 76, left bundle branch block, no change from prior tracing  Recent Labs: 01/19/2019: B Natriuretic Peptide 533.7; Magnesium 2.3 02/16/2019: TSH 1.380 02/19/2019: ALT 42 02/22/2019: BUN 12; Creatinine, Ser 1.10; Hemoglobin 9.9; Platelets 169; Potassium 4.4; Sodium 131   Recent Lipid Panel Lab Results  Component Value Date/Time   CHOL 111 11/03/2018 12:05 PM   CHOL 90  12/22/2012 10:42 AM   TRIG 87 11/03/2018 12:05 PM   TRIG 85 12/22/2012 10:42 AM   HDL 30 (L) 11/03/2018 12:05 PM   HDL 33 (L) 12/22/2012 10:42 AM   CHOLHDL 3.6 08/05/2017 10:48 AM   CHOLHDL 4.2 04/25/2012 05:20 AM   LDLCALC 64 11/03/2018 12:05 PM   LDLCALC 40 12/22/2012 10:42 AM    Physical Exam:    VS:  BP 112/84   Pulse 76   Ht _0  (1.6 m)   Wt 222 lb (100.7 kg)   SpO2 99%   BMI 39.33 kg/m     Wt Readings from Last 3 Encounters:  06/29/19 222 lb (100.7 kg)  04/20/19 215 lb 3.2 oz (97.6 kg)  03/11/19 226 lb (102.5 kg)     Constitutional:      Appearance: Healthy appearance. Not in distress.  Pulmonary:     Effort: Pulmonary effort is normal.     Breath sounds: No wheezing. No rales.  Cardiovascular:     Normal rate. Regular rhythm. Normal S1. Normal S2.     Murmurs: There is no murmur.  Edema:    Pretibial: bilateral trace edema of the pretibial area.    Comments: Compression stockings in place Abdominal:     Palpations: Abdomen is soft.  Musculoskeletal:     Cervical back: Neck supple. Skin:    General: Skin is warm and dry.  Neurological:     Mental Status: Alert and oriented to person, place and time.     Cranial Nerves: Cranial nerves are intact.      ASSESSMENT & PLAN:    1. Acute on chronic combined systolic and diastolic CHF (congestive heart failure) (HCC) EF 50 by echocardiogram in January 2021.  She has an ischemic cardiomyopathy.  NYHA II-IIb.  Recent ICM clinic evaluation demonstrates abnormal thoracic impedance suggesting fluid accumulation.  Her weight is gone up 3 pounds at home and she has noted worsening lower extremity swelling.  When she was admitted with acute kidney injury and DKA back in February, her furosemide, spironolactone and lisinopril were all discontinued.  She still has furosemide at home.  I have  recommended that she take 20 mg daily for 3 days and then stop.  She has another ICM clinic follow-up next week.  I will obtain a BMET  in 2 weeks.  If she does not require further furosemide and her creatinine remains stable, I will try to reinitiate ACE inhibitor therapy with lisinopril 2.5 mg daily.  2. Coronary artery disease involving native coronary artery of native heart without angina pectoris History of CABG in 2007.  During her admission in January 2021, cardiac catheterization demonstrated patent bypass grafts.  She is not having anginal symptoms.  She is not on aspirin as she is on Rivaroxaban.  Continue rosuvastatin, carvedilol.  3. ICD (implantable cardioverter-defibrillator) in place Continue follow-up with EP as planned.  4. History of DVT (deep vein thrombosis) She is on long-term anticoagulation with Rivaroxaban.  5. Essential hypertension The patient's blood pressure is controlled on her current regimen.  Continue current therapy.    Dispo:  No follow-ups on file.   Medication Adjustments/Labs and Tests Ordered: Current medicines are reviewed at length with the patient today.  Concerns regarding medicines are outlined above.  Tests Ordered: Orders Placed This Encounter  Procedures  . Basic metabolic panel  . EKG 12-Lead   Medication Changes: No orders of the defined types were placed in this encounter.   Signed, [name], PA-C  06/29/2019 4:58 PM    [facility] [address], [place], [place]  [address] Phone: [phone]; Fax: [phone]

## 2019-06-29 ENCOUNTER — Encounter: Payer: Self-pay | Admitting: Physician Assistant

## 2019-06-29 ENCOUNTER — Ambulatory Visit (INDEPENDENT_AMBULATORY_CARE_PROVIDER_SITE_OTHER): Payer: Medicare HMO | Admitting: Physician Assistant

## 2019-06-29 ENCOUNTER — Ambulatory Visit (INDEPENDENT_AMBULATORY_CARE_PROVIDER_SITE_OTHER): Payer: Medicare HMO

## 2019-06-29 ENCOUNTER — Other Ambulatory Visit: Payer: Self-pay

## 2019-06-29 VITALS — BP 112/84 | HR 76 | Ht 63.0 in | Wt 222.0 lb

## 2019-06-29 DIAGNOSIS — I2510 Atherosclerotic heart disease of native coronary artery without angina pectoris: Secondary | ICD-10-CM

## 2019-06-29 DIAGNOSIS — Z86718 Personal history of other venous thrombosis and embolism: Secondary | ICD-10-CM

## 2019-06-29 DIAGNOSIS — Z95810 Presence of automatic (implantable) cardiac defibrillator: Secondary | ICD-10-CM | POA: Diagnosis not present

## 2019-06-29 DIAGNOSIS — I5043 Acute on chronic combined systolic (congestive) and diastolic (congestive) heart failure: Secondary | ICD-10-CM

## 2019-06-29 DIAGNOSIS — I5042 Chronic combined systolic (congestive) and diastolic (congestive) heart failure: Secondary | ICD-10-CM

## 2019-06-29 DIAGNOSIS — I10 Essential (primary) hypertension: Secondary | ICD-10-CM

## 2019-06-29 NOTE — Progress Notes (Signed)
EPIC Encounter for ICM Monitoring  Patient Name: [name] is a 69 y.o. female Date: 06/29/2019 Primary Care Physican: Everardo Beals, NP Primary Cardiologist:Skains/Weaver PA Electrophysiologist: Allred 06/29/2019 Weight: 220 lbs  AT/AF Burden: <1% (taking Xarelto)      Spoke with patient.  Heart Failure questions reviewed.    Patient is symptomatic with weight gain of 3 lbs within last couple of days, swelling around ankles when she takes of socks but denies any breathing problems.   CorVue thoracic impedance suggesting possible fluid accumulation starting 06/27/19.  Not prescribed a diuretic  Labs:  02/22/2019 Creatinine 1.10, BUN 12, Potassium 4.4, Sodium 131, GFR 52-60 01/27/2018 Creatinine 1.22, BUN 22, Potassium 5.3, Sodium 135, GFR 46-53  Recommendations: Recommendation to limit salt intake to 2000 mg daily and fluid intake to 64 oz daily.  Encouraged to call if experiencing any fluid symptoms.   Next ICM clinic phone appointment due: 07/07/2019 to recheck fluid levels.     Next 91 day remote transmission due: 07/24/2019.    Next office visit due: 06/29/2019 with [name] PA.    Copy of ICM check sent to Dr. Rayann Heman and [name], PA since patient has OV today, 06/29/2019.   3 month ICM trend: 06/29/2019    1 Year ICM trend:     [name], RN 06/29/2019 2:32 PM

## 2019-06-29 NOTE — Patient Instructions (Signed)
Medication Instructions:   Your physician has recommended you make the following change in your medication:   1) Take 20 mg (half tablet) by mouth for 3 days, then stop medication  *If you need a refill on your cardiac medications before your next appointment, please call your pharmacy*  Lab Work:  Your physician recommends that you return for lab work in 2 weeks for a BMET  If you have labs (blood work) drawn today and your tests are completely normal, you will receive your results only by: Marland Kitchen MyChart Message (if you have MyChart) OR . A paper copy in the mail If you have any lab test that is abnormal or we need to change your treatment, we will call you to review the results.  Testing/Procedures:  None ordered today  Follow-Up: At [facility], you and your health needs are our priority.  As part of our continuing mission to provide you with exceptional heart care, we have created designated Provider Care Teams.  These Care Teams include your primary Cardiologist (physician) and Advanced Practice Providers (APPs -  Physician Assistants and Nurse Practitioners) who all work together to provide you with the care you need, when you need it.  We recommend signing up for the patient portal called "MyChart".  Sign up information is provided on this After Visit Summary.  MyChart is used to connect with patients for Virtual Visits (Telemedicine).  Patients are able to view lab/test results, encounter notes, upcoming appointments, etc.  Non-urgent messages can be sent to your provider as well.   To learn more about what you can do with MyChart, go to [email or web address].    Your next appointment:   3 month(s)  The format for your next appointment:   In Person  Provider:   You may see Candee Furbish, MD or [name], PA-C

## 2019-06-29 NOTE — Addendum Note (Signed)
Addended by: [name] on: 06/29/2019 02:43 PM   Modules accepted: Level of Service

## 2019-06-29 NOTE — Progress Notes (Signed)
Thank you. I put her on Lasix x 3 days. Please let me know what her follow up check is next week. [name], PA-C    06/29/2019 5:20 PM

## 2019-07-07 ENCOUNTER — Ambulatory Visit (INDEPENDENT_AMBULATORY_CARE_PROVIDER_SITE_OTHER): Payer: Medicare HMO

## 2019-07-07 DIAGNOSIS — Z95810 Presence of automatic (implantable) cardiac defibrillator: Secondary | ICD-10-CM | POA: Diagnosis not present

## 2019-07-07 DIAGNOSIS — I5042 Chronic combined systolic (congestive) and diastolic (congestive) heart failure: Secondary | ICD-10-CM | POA: Diagnosis not present

## 2019-07-10 NOTE — Progress Notes (Signed)
EPIC Encounter for ICM Monitoring  Patient Name: [name] is a 69 y.o. female Date: 07/10/2019 Primary Care Physican: Everardo Beals, NP Primary Cardiologist:Skains/Weaver PA Electrophysiologist: Allred 06/29/2019 Weight: 220 lbs 07/10/2019 Weight: 217 lbs  AT/AF Burden: <1% (taking Xarelto)                                                          Spoke with patient.  She reported weight returned to baseline and leg swelling has improved since taking 3 day of Lasix.  She is feeling fine at this time.   CorVue thoracic impedance returned to normal after taking Furosemide 20 mg daily x 3 days (then stop) as instructed at 06/29/2019 office visit with [name], PA.  Not prescribed a diuretic  Labs: Scheduled for BMET on 07/17/2019 02/22/2019 Creatinine 1.10, BUN 12, Potassium 4.4, Sodium 131, GFR 52-60 01/27/2018 Creatinine 1.22, BUN 22, Potassium 5.3, Sodium 135, GFR 46-53  Recommendations: No changes and encouraged to call if experiencing any fluid symptoms.  Next ICM clinic phone appointment due: 08/10/2019.     Next 91 day remote transmission due: 07/24/2019.    EP/Cardiology Office Visits: 09/29/2019 with [name], PA.    Copy of ICM check sent to Dr. Rayann Heman and [name], PA.   3 month ICM trend: 07/07/2019    1 Year ICM trend:       [name], RN 07/10/2019 9:33 AM

## 2019-07-13 NOTE — Progress Notes (Signed)
Thank you! [name], PA-C    07/13/2019 10:05 AM

## 2019-07-15 NOTE — Progress Notes (Signed)
Name: [name]  Age/ Sex: 69 y.o., female   MRN/ DOB: [id number], 1951-01-03     PCP: Everardo Beals, NP   Reason for Endocrinology Evaluation: Type 2 Diabetes Mellitus  Initial Endocrine Consultative Visit: 04/20/2019    PATIENT IDENTIFIER: [name] is a 69 y.o. female with a past medical history of DM, HTN, Dyslipidemia and CAD.. The patient has followed with Endocrinology clinic since 04/20/2019 for consultative assistance with management of her diabetes.  DIABETIC HISTORY:  [name] was diagnosed with T2DM many years ago, she is intolerant to Metformin, has been on Glipizide without reported intolerance. Her hemoglobin A1c has ranged from 7.8% in 2017, peaking at 9.5% in 2014.  On her initial visit to our clinic she had an A1c of 7.7%  , she was on MDI regimen and Ozempic. Marland Kitchen We switched levemir to tresiba and continued Ozempic and Novolog.  SUBJECTIVE:   During the last visit (04/20/2019): A1c 7.7%, switched Levemir to [place] and continue novolog and Ozempic   Today (07/15/2019): [name] is here for a follow up on diabetes.  She checks her blood sugar 3 times daily, preprandial. The patient has had hypoglycemic episodes since the last clinic visit, which typically occur 1 x /week - most often occuring during the day. The patient is  symptomatic with these episodes.  Denies nausea and diarrhea     HOME DIABETES REGIMEN:  Tresiba  26 units ONCE  Daily  Novolog 10 units with each meal  Ozempic 0.5 mg once weekly     Statin: Yes ACE-I/ARB: No   GLUCOSE LOG Fasting 106- 143 Lunch 67- 207 Dinner 117- 204    DIABETIC COMPLICATIONS: Microvascular complications:   Neuropathy  Denies: CKD, retinopathy   Last Eye Exam: Completed 2019  Macrovascular complications:   CAD, CHF  Denies: CVA, PVD   HISTORY:  Past Medical History:  Past Medical History:  Diagnosis Date  . Abnormal LFTs   . AICD (automatic cardioverter/defibrillator) present    . Angina decubitus ([place]) 11/06/2012  . Angina pectoris ([place])   . Anxiety   . Arthritis   . Back pain   . Bundle branch block 05/2016  . CAD (coronary artery disease) 06/18/2010  . CHF (congestive heart failure) ([place])   . Chronic combined systolic and diastolic CHF, NYHA class 3 ([place])   . Chronic combined systolic and diastolic heart failure ([place])   . Chronic systolic dysfunction of left ventricle 09/24/2012  . Coronary artery disease    s/p CABG 2007  . Depression   . Dermal mycosis   . Diabetes mellitus    type II  . DKA (diabetic ketoacidoses) ([place]) 01/12/2019  . DVT (deep venous thrombosis) ([place]) 09/2015   bilateral  . Edema, lower extremity   . Glaucoma   . Hyperlipidemia   . Hypertension   . Hypoglycemia 09/09/2012  . Insomnia   . Insulin dependent diabetes mellitus ([place]) 04/23/2012  . Ischemic cardiomyopathy    EF 30-35%, s/p ICD 4/08 by Dr Leonia Reeves  . Joint pain   . LBBB (left bundle branch block) 09/24/2012  . Morbid obesity ([place]) 11/06/2012  . Obesity   . Oxygen dependent 2 L  . Pain in left knee   . Peripheral neuropathy   . 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 Sleep apnea    uses O2 at night  .  Tinea   . Type 2 diabetes mellitus with hyperglycemia, with long-term current use of insulin ([place]) 04/20/2019  . Uncontrolled diabetes mellitus ([place]) 02/2019  . Ventricular tachycardia ([place]) 01/16/15   sustained VT terminated with ATP, CL 340 msec  . Vitamin B 12 deficiency 11/05/2018  . Vitamin D deficiency 11/05/2018    Past Surgical History:  Past Surgical History:  Procedure Laterality Date  . BI-VENTRICULAR IMPLANTABLE CARDIOVERTER DEFIBRILLATOR UPGRADE N/A 09/25/2012   Procedure: BI-VENTRICULAR IMPLANTABLE CARDIOVERTER DEFIBRILLATOR UPGRADE;  Surgeon: Coralyn Mark, MD;  Location: [facility] CATH LAB;  Service: Cardiovascular;  Laterality: N/A;  . BREAST BIOPSY Right 04/05/2014  .  CARDIAC CATHETERIZATION    . CARDIAC DEFIBRILLATOR PLACEMENT  4/08   by Dr Leonia Reeves (MDT)  . COLONOSCOPY WITH PROPOFOL N/A 10/12/2016   Procedure: COLONOSCOPY WITH PROPOFOL;  Surgeon: Doran Stabler, MD;  Location: [facility] ENDOSCOPY;  Service: Gastroenterology;  Laterality: N/A;  . CORONARY ARTERY BYPASS GRAFT  10/05/2005   by Dr Cyndia Bent  . IMPLANTABLE CARDIOVERTER DEFIBRILLATOR IMPLANT  09/25/12   attempt of upgrade to CRT-D unsuccessful due to CS anatomy, SJM Unify [place] device placed with LV port capped by Dr Rayann Heman  . IR GENERIC HISTORICAL  10/03/2015   IR US GUIDE VASC ACCESS LEFT 10/03/2015 Jacqulynn Cadet, MD [facility]  . IR GENERIC HISTORICAL  10/03/2015   IR VENO/EXT/UNI LEFT 10/03/2015 Jacqulynn Cadet, MD [facility]  . IR GENERIC HISTORICAL  10/03/2015   IR VENOCAVAGRAM IVC 10/03/2015 Jacqulynn Cadet, MD [facility]  . IR GENERIC HISTORICAL  10/03/2015   IR INFUSION THROMBOL VENOUS INITIAL (MS) 10/03/2015 Jacqulynn Cadet, MD [facility]  . IR GENERIC HISTORICAL  10/03/2015   IR US GUIDE VASC ACCESS LEFT 10/03/2015 Jacqulynn Cadet, MD [facility]  . IR GENERIC HISTORICAL  10/04/2015   IR THROMBECT VENO MECH MOD SED 10/04/2015 Sandi Mariscal, MD [facility]  . IR GENERIC HISTORICAL  10/04/2015   IR TRANSCATH PLC STENT 1ST ART NOT LE CV CAR VERT CAR 10/04/2015 Sandi Mariscal, MD [facility]  . IR GENERIC HISTORICAL  10/04/2015   IR THROMB F/U EVAL ART/VEN FINAL DAY (MS) 10/04/2015 Sandi Mariscal, MD [facility]  . IR GENERIC HISTORICAL  11/02/2015   IR RADIOLOGIST EVAL & MGMT 11/02/2015 Sandi Mariscal, MD [facility]  . IR RADIOLOGIST EVAL & MGMT  04/26/2016  . LEFT HEART CATH AND CORS/GRAFTS ANGIOGRAPHY N/A 01/19/2019   Procedure: LEFT HEART CATH AND CORS/GRAFTS ANGIOGRAPHY;  Surgeon: Belva Crome, MD;  Location: [facility] CV LAB;  Service: Cardiovascular;  Laterality: N/A;     Social History:  reports that she has never smoked. She has never used smokeless tobacco. She reports  that she does not drink alcohol and does not use drugs. Family History:  Family History  Problem Relation Age of Onset  . Diabetes Mother 52       died - HTN  . Stroke Mother   . High blood pressure Mother   . Sudden death Mother   . Obesity Mother   . Other Other        No early family hx of CAD      HOME MEDICATIONS: Allergies as of 07/16/2019      Reactions   Lipitor  [atorvastatin Calcium] Rash   Metformin And Related Itching, Swelling, Other (See Comments)   Leg pain & swelling in legs   Atorvastatin Itching, Rash   Levofloxacin Itching      Medication List  Accurate as of July 15, 2019  2:02 PM. If you have any questions, ask your nurse or doctor.        albuterol 108 (90 Base) MCG/ACT inhaler Commonly known as: VENTOLIN HFA Inhale 2 puffs into the lungs every 6 (six) hours as needed for wheezing or shortness of breath.   albuterol (2.5 MG/3ML) 0.083% nebulizer solution Commonly known as: PROVENTIL Take 2.5 mg by nebulization every 6 (six) hours as needed for wheezing or shortness of breath.   amitriptyline 10 MG tablet Commonly known as: ELAVIL Take 10 mg by mouth daily.   carvedilol 6.25 MG tablet Commonly known as: COREG Take 1 tablet (6.25 mg total) by mouth 2 (two) times daily with a meal.   ferrous sulfate 325 (65 FE) MG EC tablet Take 325 mg by mouth daily.   FreeStyle Libre 14 Day Reader Kerrin Mo 1 Device by Does not apply route as directed.   FreeStyle Libre 14 Day Sensor Misc 1 Device by Does not apply route as directed.   Insulin Pen Needle 32G X 4 MM Misc 1 Device by Does not apply route in the morning, at noon, in the evening, and at bedtime.   latanoprost 0.005 % ophthalmic solution Commonly known as: XALATAN Place 1 drop into both eyes at bedtime.   methocarbamol 500 MG tablet Commonly known as: ROBAXIN Take 1 tablet (500 mg total) by mouth 2 (two) times daily as needed for muscle spasms.   MUSCLE RELIEF EX Apply 1 application  topically daily. For pain   nitroGLYCERIN 0.4 MG SL tablet Commonly known as: NITROSTAT Place 0.4 mg under the tongue every 5 (five) minutes as needed for chest pain.   NovoLOG FlexPen 100 UNIT/ML FlexPen Generic drug: insulin aspart Inject 10 Units into the skin 3 (three) times daily with meals.   OZEMPIC (0.25 OR 0.5 MG/DOSE) [place] Inject 0.5 mg into the skin every Thursday.   rosuvastatin 20 MG tablet Commonly known as: CRESTOR Take 1 tablet (20 mg total) by mouth daily at 6 PM. What changed: when to take this   traMADol 50 MG tablet Commonly known as: ULTRAM Take 50 mg by mouth 2 (two) times daily.   Tyler Aas FlexTouch 100 UNIT/ML FlexTouch Pen Generic drug: insulin degludec Inject 0.26 mLs (26 Units total) into the skin daily.   Vitamin D (Ergocalciferol) 1.25 MG (50000 UNIT) Caps capsule Commonly known as: DRISDOL Take 1 capsule (50,000 Units total) by mouth every 7 (seven) days. What changed: when to take this   Xarelto 20 MG Tabs tablet Generic drug: rivaroxaban Take 20 mg by mouth daily.        OBJECTIVE:   Vital Signs: There were no vitals taken for this visit.  Wt Readings from Last 3 Encounters:  06/29/19 222 lb (100.7 kg)  04/20/19 215 lb 3.2 oz (97.6 kg)  03/11/19 226 lb (102.5 kg)     Exam: General: Pt appears well and is in NAD  Lungs: Clear with good BS bilat with no rales, rhonchi, or wheezes  Heart: RRR with normal S1 and S2 and no gallops; no murmurs; no rub  Extremities: 1+ pretibial edema.  Neuro: MS is good with appropriate affect, pt is alert and Ox3     DM Foot Exam 07/16/2019    The skin of the feet is intact without sores or ulcerations. The pedal pulses are 1+ on right and 1+ on left. The sensation is decreased to a screening 5.07, 10 gram monofilament bilaterally  DATA REVIEWED:  Lab Results  Component Value Date   HGBA1C 7.7 (A) 04/20/2019   HGBA1C 8.3 (H) 01/13/2019   HGBA1C 8.2 (H) 11/03/2018   Lab Results    Component Value Date   LDLCALC 64 11/03/2018   CREATININE 1.10 (H) 02/22/2019   No results found for: [facility]   Lab Results  Component Value Date   CHOL 111 11/03/2018   HDL 30 (L) 11/03/2018   LDLCALC 64 11/03/2018   TRIG 87 11/03/2018   CHOLHDL 3.6 08/05/2017         ASSESSMENT / PLAN / RECOMMENDATIONS:   1) Type 2 Diabetes Mellitus, Optimally controlled, With Neuropathic and  macrovascular complications - Most recent A1c of 6.2 %. Goal A1c < 7.0 %.    - I have congratulated the pt on improved glycemic control.  - A1c down from 7.7%  - Pt noted with occasional hypoglycemia and hyperglycemia during the day, this is due to insulin -Cab mismatch  - I am going to increase Ozempic and adjust insulin accordingly.  - If her GFR remains stable , I will consider an SGLT-2 inhibitor , as she would be a great candidate for it , given hx of CHF     MEDICATIONS: - Decrease Tresiba at 22 units ONCE  Daily  - Decrease Novolog 6 units with each meal  - Increase Ozempic 1 mg once weekly  EDUCATION / INSTRUCTIONS:  BG monitoring instructions: Patient is instructed to check her blood sugars 3 times a day, before meals  Call [facility] if: BG persistently < 70 . I reviewed the Rule of 15 for the treatment of hypoglycemia in detail with the patient. Literature supplied.   2) Diabetic complications:   Eye: Does not have known diabetic retinopathy.   Neuro/ Feet: Does  have known diabetic peripheral neuropathy based on foot exam from 07/16/2019  Renal: Patient does not have known baseline CKD. She is not on an ACEI/ARB at present.   F/U in 4 months    Signed electronically by: Mack Guise, MD  [facility]  [facility] [address] [place], [place] [address] Phone: [phone] FAX: [phone]   CC: Everardo Beals, [place] [address] Phone: [phone]  Fax:  [phone]  Return to Endocrinology clinic as below: Future Appointments  Date Time Provider [place]  07/16/2019  9:10 AM Lamone Ferrelli, Melanie Crazier, MD [facility] None  07/17/2019 12:00 PM [facility] LAB [facility]  07/24/2019  7:00 AM [facility] DEVICE REMOTES [facility]  08/10/2019 10:45 AM [facility] DEVICE REMOTES [facility]  09/29/2019  1:45 PM [name] T, PA-C [facility]  10/23/2019  7:00 AM [facility] DEVICE REMOTES [facility]

## 2019-07-16 ENCOUNTER — Other Ambulatory Visit: Payer: Self-pay

## 2019-07-16 ENCOUNTER — Ambulatory Visit (INDEPENDENT_AMBULATORY_CARE_PROVIDER_SITE_OTHER): Payer: Medicare HMO | Admitting: Internal Medicine

## 2019-07-16 ENCOUNTER — Encounter: Payer: Self-pay | Admitting: Internal Medicine

## 2019-07-16 VITALS — BP 132/78 | HR 89 | Ht 63.0 in | Wt 218.2 lb

## 2019-07-16 DIAGNOSIS — E1142 Type 2 diabetes mellitus with diabetic polyneuropathy: Secondary | ICD-10-CM | POA: Diagnosis not present

## 2019-07-16 DIAGNOSIS — E1159 Type 2 diabetes mellitus with other circulatory complications: Secondary | ICD-10-CM

## 2019-07-16 DIAGNOSIS — E1165 Type 2 diabetes mellitus with hyperglycemia: Secondary | ICD-10-CM | POA: Diagnosis not present

## 2019-07-16 DIAGNOSIS — Z794 Long term (current) use of insulin: Secondary | ICD-10-CM

## 2019-07-16 LAB — POCT GLYCOSYLATED HEMOGLOBIN (HGB A1C): Hemoglobin A1C: 6.2 % — AB (ref 4.0–5.6)

## 2019-07-16 MED ORDER — NOVOLOG FLEXPEN 100 UNIT/ML ~~LOC~~ SOPN: 6.0000 [IU] | PEN_INJECTOR | Freq: Three times a day (TID) | SUBCUTANEOUS | 3 refills | Status: DC

## 2019-07-16 MED ORDER — TRESIBA FLEXTOUCH 100 UNIT/ML ~~LOC~~ SOPN: 22.0000 [IU] | PEN_INJECTOR | Freq: Every day | SUBCUTANEOUS | 3 refills | Status: DC

## 2019-07-16 MED ORDER — OZEMPIC (1 MG/DOSE) 2 MG/1.5ML ~~LOC~~ SOPN: 1.0000 mg | PEN_INJECTOR | SUBCUTANEOUS | 3 refills | Status: DC

## 2019-07-16 NOTE — Patient Instructions (Addendum)
-   You have done an AMAZING Job, keep up the good work ! - Decrease Tresiba at 22 units ONCE  Daily  - Decrease Novolog 6 units with each meal  - Increase Ozempic 1 mg once weekly       HOW TO TREAT LOW BLOOD SUGARS (Blood sugar LESS THAN 70 MG/DL)  Please follow the RULE OF 15 for the treatment of hypoglycemia treatment (when your (blood sugars are less than 70 mg/dL)    STEP 1: Take 15 grams of carbohydrates when your blood sugar is low, which includes:   3-4 GLUCOSE TABS  OR  3-4 OZ OF JUICE OR REGULAR SODA OR  ONE TUBE OF GLUCOSE GEL     STEP 2: RECHECK blood sugar in 15 MINUTES STEP 3: If your blood sugar is still low at the 15 minute recheck --> then, go back to STEP 1 and treat AGAIN with another 15 grams of carbohydrates.

## 2019-07-17 ENCOUNTER — Other Ambulatory Visit: Payer: Medicare HMO | Admitting: *Deleted

## 2019-07-17 DIAGNOSIS — I5043 Acute on chronic combined systolic (congestive) and diastolic (congestive) heart failure: Secondary | ICD-10-CM

## 2019-07-17 DIAGNOSIS — I2510 Atherosclerotic heart disease of native coronary artery without angina pectoris: Secondary | ICD-10-CM

## 2019-07-17 DIAGNOSIS — I10 Essential (primary) hypertension: Secondary | ICD-10-CM

## 2019-07-17 DIAGNOSIS — Z95810 Presence of automatic (implantable) cardiac defibrillator: Secondary | ICD-10-CM

## 2019-07-17 DIAGNOSIS — Z86718 Personal history of other venous thrombosis and embolism: Secondary | ICD-10-CM

## 2019-07-17 LAB — BASIC METABOLIC PANEL
BUN/Creatinine Ratio: 21 (ref 12–28)
BUN: 19 mg/dL (ref 8–27)
CO2: 25 mmol/L (ref 20–29)
Calcium: 9.9 mg/dL (ref 8.7–10.3)
Chloride: 99 mmol/L (ref 96–106)
Creatinine, Ser: 0.91 mg/dL (ref 0.57–1.00)
GFR calc Af Amer: 74 mL/min/{1.73_m2} (ref >59)
GFR calc non Af Amer: 65 mL/min/{1.73_m2} (ref >59)
Glucose: 128 mg/dL — ABNORMAL HIGH (ref 65–99)
Potassium: 4.3 mmol/L (ref 3.5–5.2)
Sodium: 137 mmol/L (ref 134–144)

## 2019-07-21 ENCOUNTER — Other Ambulatory Visit: Payer: Self-pay

## 2019-07-21 DIAGNOSIS — I5042 Chronic combined systolic (congestive) and diastolic (congestive) heart failure: Secondary | ICD-10-CM

## 2019-07-21 DIAGNOSIS — I10 Essential (primary) hypertension: Secondary | ICD-10-CM

## 2019-07-21 MED ORDER — LISINOPRIL 2.5 MG PO TABS
2.5000 mg | ORAL_TABLET | Freq: Every day | ORAL | 1 refills | Status: DC
Start: 2019-07-21 — End: 2019-11-11

## 2019-07-24 ENCOUNTER — Ambulatory Visit (INDEPENDENT_AMBULATORY_CARE_PROVIDER_SITE_OTHER): Payer: Medicare HMO | Admitting: *Deleted

## 2019-07-24 DIAGNOSIS — I509 Heart failure, unspecified: Secondary | ICD-10-CM | POA: Diagnosis not present

## 2019-07-26 LAB — CUP PACEART REMOTE DEVICE CHECK
Battery Remaining Longevity: 34 mo
Battery Remaining Percentage: 32 %
Battery Voltage: 2.87 V
Brady Statistic AP VP Percent: 1 %
Brady Statistic AP VS Percent: 1 %
Brady Statistic AS VP Percent: 1 %
Brady Statistic AS VS Percent: 99 %
Brady Statistic RA Percent Paced: 1 %
Brady Statistic RV Percent Paced: 1 %
Date Time Interrogation Session: 20210716142530
HighPow Impedance: 34 Ohm
HighPow Impedance: 34 Ohm
Implantable Lead Implant Date: 20080423
Implantable Lead Implant Date: 20080423
Implantable Lead Implant Date: 20140918
Implantable Lead Location: 753858
Implantable Lead Location: 753859
Implantable Lead Location: 753860
Implantable Lead Model: 5076
Implantable Lead Model: 6947
Implantable Pulse Generator Implant Date: 20140918
Lead Channel Impedance Value: 350 Ohm
Lead Channel Impedance Value: 390 Ohm
Lead Channel Pacing Threshold Amplitude: 0.5 V
Lead Channel Pacing Threshold Amplitude: 0.75 V
Lead Channel Pacing Threshold Pulse Width: 0.5 ms
Lead Channel Pacing Threshold Pulse Width: 0.5 ms
Lead Channel Sensing Intrinsic Amplitude: 1 mV
Lead Channel Sensing Intrinsic Amplitude: 12 mV
Lead Channel Setting Pacing Amplitude: 2 V
Lead Channel Setting Pacing Amplitude: 2.5 V
Lead Channel Setting Pacing Pulse Width: 0.5 ms
Lead Channel Setting Sensing Sensitivity: 0.5 mV
Pulse Gen Serial Number: 7070007

## 2019-07-27 NOTE — Progress Notes (Signed)
Remote ICD transmission.   

## 2019-07-31 LAB — HM DIABETES EYE EXAM

## 2019-08-10 ENCOUNTER — Other Ambulatory Visit (INDEPENDENT_AMBULATORY_CARE_PROVIDER_SITE_OTHER): Payer: Self-pay | Admitting: Bariatrics

## 2019-08-10 ENCOUNTER — Ambulatory Visit (INDEPENDENT_AMBULATORY_CARE_PROVIDER_SITE_OTHER): Payer: Medicare HMO

## 2019-08-10 DIAGNOSIS — E559 Vitamin D deficiency, unspecified: Secondary | ICD-10-CM

## 2019-08-10 DIAGNOSIS — Z95810 Presence of automatic (implantable) cardiac defibrillator: Secondary | ICD-10-CM

## 2019-08-10 DIAGNOSIS — I509 Heart failure, unspecified: Secondary | ICD-10-CM

## 2019-08-14 ENCOUNTER — Other Ambulatory Visit: Payer: Self-pay

## 2019-08-14 ENCOUNTER — Telehealth: Payer: Self-pay

## 2019-08-14 ENCOUNTER — Other Ambulatory Visit: Payer: Medicare HMO | Admitting: *Deleted

## 2019-08-14 DIAGNOSIS — I10 Essential (primary) hypertension: Secondary | ICD-10-CM

## 2019-08-14 DIAGNOSIS — I5042 Chronic combined systolic (congestive) and diastolic (congestive) heart failure: Secondary | ICD-10-CM

## 2019-08-14 LAB — BASIC METABOLIC PANEL
BUN/Creatinine Ratio: 19 (ref 12–28)
BUN: 18 mg/dL (ref 8–27)
CO2: 25 mmol/L (ref 20–29)
Calcium: 10.1 mg/dL (ref 8.7–10.3)
Chloride: 101 mmol/L (ref 96–106)
Creatinine, Ser: 0.95 mg/dL (ref 0.57–1.00)
GFR calc Af Amer: 71 mL/min/{1.73_m2} (ref >59)
GFR calc non Af Amer: 61 mL/min/{1.73_m2} (ref >59)
Glucose: 84 mg/dL (ref 65–99)
Potassium: 4.2 mmol/L (ref 3.5–5.2)
Sodium: 140 mmol/L (ref 134–144)

## 2019-08-14 NOTE — Progress Notes (Signed)
EPIC Encounter for ICM Monitoring  Patient Name: [name] is a 69 y.o. female Date: 08/14/2019 Primary Care Physican: Everardo Beals, NP Primary Cardiologist:Skains/Weaver PA Electrophysiologist: Allred 07/10/2019 Weight: 217 lbs  AT/AF Burden: <1% (taking Xarelto)  Attempted call to patient and unable to reach.  Left detailed message per DPR regarding transmission. Transmission reviewed.   CorVue thoracic impedance normal.  Not prescribed a diuretic  Labs:  07/17/2019 Creatinine 0.91, BUN 19, Potassium 4.3, Sodium 137, GFR 65-74 02/22/2019 Creatinine 1.10, BUN 12, Potassium 4.4, Sodium 131, GFR 52-60 01/27/2018 Creatinine 1.22, BUN 22, Potassium 5.3, Sodium 135, GFR 46-53  Recommendations:Left voice mail with ICM number and encouraged to call if experiencing any fluid symptoms.  Next ICM clinic phone appointment due:09/21/2019. Next 91 day remote transmission due: 10/23/2019.   EP/Cardiology Office Visits: 09/29/2019 with [name], PA.    Copy of ICM check sent to Dr. Rayann Heman.   3 month ICM trend: 08/10/2019    1 Year ICM trend:       [name], RN 08/14/2019 2:06 PM

## 2019-08-14 NOTE — Progress Notes (Signed)
Spoke with patient and reports feeling well at this time.  Denies fluid symptoms.  Current weight 222 lbs.  No changes and encouraged to call if experiencing any fluid symptoms.

## 2019-08-14 NOTE — Telephone Encounter (Signed)
Remote ICM transmission received.  Attempted call to patient regarding ICM remote transmission and left detailed message per DPR.  Advised to return call for any fluid symptoms or questions. Next ICM remote transmission scheduled 09/21/2019.     

## 2019-08-24 ENCOUNTER — Telehealth: Payer: Self-pay | Admitting: Cardiology

## 2019-08-24 NOTE — Telephone Encounter (Signed)
Looks like Lisinopril was discontinued due to acute kidney injury and DKA in February.

## 2019-08-24 NOTE — Telephone Encounter (Signed)
Patient returning call.

## 2019-08-24 NOTE — Telephone Encounter (Signed)
LMTCB

## 2019-08-24 NOTE — Telephone Encounter (Signed)
Spoke with patient and she stated that Arbie Cookey, nurse from [facility], NP's office called her Friday and would like to know why [name] has the patient on lisinopril 2.5 mg qd when she was previously taking lisinopril 10 mg qd. She requested that our office call [facility] office at [phone]. Please advise. Thanks, MI

## 2019-08-24 NOTE — Telephone Encounter (Signed)
The patient called in and spoke with Mindy, CMA and requested that our office call [place] office to answer questions that they had about the Lisinopril. Spoke with Marcello Moores at [facility], NP office.Marcello Moores states that there were no questions on their end and the patient must be confused. He states they are not in need of anything from our office at this time. The patient is aware that she should be taking 2.5 mg daily Lisinopril.

## 2019-08-24 NOTE — Telephone Encounter (Signed)
Her BP was on the low side.  It had been stopped due to AKI in the setting of DKA.  I put her back on low dose to make sure she can tolerate it. Thanks! [name], PA-C    08/24/2019 5:15 PM

## 2019-08-24 NOTE — Telephone Encounter (Signed)
Patient is requesting  instructions for how to take lisinopril (ZESTRIL) 2.5 MG tablet medication. Please advise.

## 2019-09-04 ENCOUNTER — Other Ambulatory Visit: Payer: Self-pay

## 2019-09-04 NOTE — Patient Outreach (Signed)
Aging Gracefully Program  09/04/2019  [name] 26-Oct-1950 [id number]  [facility] Evaluation Interviewer made contact with patient. Aging Gracefully survey completed.   Interviewer will send referral to Beth OT and [name] RN for follow up.   [facility] Management Assistant

## 2019-09-21 ENCOUNTER — Ambulatory Visit (INDEPENDENT_AMBULATORY_CARE_PROVIDER_SITE_OTHER): Payer: Medicare HMO

## 2019-09-21 DIAGNOSIS — Z95810 Presence of automatic (implantable) cardiac defibrillator: Secondary | ICD-10-CM | POA: Diagnosis not present

## 2019-09-21 DIAGNOSIS — I509 Heart failure, unspecified: Secondary | ICD-10-CM

## 2019-09-25 NOTE — Progress Notes (Signed)
EPIC Encounter for ICM Monitoring  Patient Name: [name] is a 69 y.o. female Date: 09/25/2019 Primary Care Physican: Everardo Beals, NP Primary Cardiologist:Skains/Weaver PA Electrophysiologist: Allred 09/25/2019 Weight: 220 lbs  AT/AF Burden: <1% (taking Xarelto)  Spoke with patient and reports feeling well at this time.  Denies fluid symptoms.    CorVue thoracic impedancenormal.  Not prescribed a diuretic  Labs:  07/17/2019 Creatinine 0.91, BUN 19, Potassium 4.3, Sodium 137, GFR 65-74 02/22/2019 Creatinine 1.10, BUN 12, Potassium 4.4, Sodium 131, GFR 52-60 01/27/2018 Creatinine 1.22, BUN 22, Potassium 5.3, Sodium 135, GFR 46-53  Recommendations:No changes and encouraged to call if experiencing any fluid symptoms.  Next ICM clinic phone appointment due:10/27/2019. Next 91 day remote transmission due: 10/26/2019.   EP/Cardiology Office Visits:09/29/2019 withScott Tome.   Copy of ICM check sent to Dr.Allred.   3 month ICM trend: 09/22/2019    1 Year ICM trend:       [name], RN 09/25/2019 3:56 PM

## 2019-09-29 ENCOUNTER — Ambulatory Visit: Payer: Medicare HMO | Admitting: Physician Assistant

## 2019-09-29 NOTE — Progress Notes (Deleted)
Cardiology Office Note:    Date:  09/29/2019   ID:  [name], DOB August 24, 1950, MRN [id number]  PCP:  Everardo Beals, NP  [facility] Cardiologist:  Candee Furbish, MD *** [facility] Electrophysiologist:  Thompson Grayer, MD   Referring MD: Everardo Beals, NP   Chief Complaint:  No chief complaint on file.    Patient Profile:    [name] is a 69 y.o. female with:   Coronary artery disease  ? S/p CABG in 2007 ? Cath 01/2019: patent bypass grafts   Systolic CHF ? Followed in [facility] clinic  ? Ischemic CM ? Echocardiogram 3/17: EF 40-45 ? Echocardiogram 01/2019: EF 50  Ventricular tachycardia ? S/p ICD (CRT unsuccessful 2/2 vein sclerosis)  Diabetes mellitus   Hypertension   DVT ? admx 9/17 w/ extensive bilat DVT >> thrombolysis (intravascular tPA, stenting to L CIV/EIV) ? Rivaroxaban Rx  OSA  Prior CV studies: Cardiac catheterization 02-16-2019 LAD mid diffuse 80; D1 100 RI 100 LCx prox 100; OM1 mod dz; OM2 mod dz RCA prox 100; RPDA mod dz; RPAV mod dz S-OM1/OM2 patent S-RPDA patent  S-RI patent  L-D1 EF 25-35  Myoview 01/17/2019 Fixed defects in septum, apex, ant wall, post wall and post-lat wall Possible small amount of reversibility in ant septum and ant wall (subtle) EF 20  High risk  Echocardiogram 01/13/2019 EF 50, poss apical WMA, mild LVH, mild MAC  Echo 03/13/15 Mild to mod LVH, EF 40-45%, no RWMA, Gr 1 DD  Myoview 5/16 Findings consistent with prior myocardial infarction. This is a high risk study. The left ventricular ejection fraction is severely decreased (<30%). Defect 1: There is a large defect of severe severity present in the mid inferoseptal and apex location.  [place] 12/11 EF 30-35% L-D2 patent S-RI patent S-OM1/OM2 patent S-PDA patent  History of Present Illness:    [name] was admitted in 01/2019 with probable gallstone pancreatitis and suspected DKA.  She had elevated Troponin levels and received 1  shock from her ICD for polymorphic VT.  Cardiac catheterization demonstrated stable CAD with patent grafts.  Med Rx was continued.  She was last seen in 6/21.  Her thoracic impedance was down and I adjusted her diuretics.  I also put her back on ACE inhibitor with Lisinopril 2.5 mg.  She returns for follow up.  ***      Past Medical History:  Diagnosis Date  . Abnormal LFTs   . AICD (automatic cardioverter/defibrillator) present   . Angina decubitus ([place]) 11/06/2012  . Angina pectoris ([place])   . Anxiety   . Arthritis   . Back pain   . Bundle branch block 05/2016  . CAD (coronary artery disease) 06/18/2010  . CHF (congestive heart failure) ([place])   . Chronic combined systolic and diastolic CHF, NYHA class 3 ([place])   . Chronic combined systolic and diastolic heart failure ([place])   . Chronic systolic dysfunction of left ventricle 09/24/2012  . Coronary artery disease    s/p CABG 2007  . Depression   . Dermal mycosis   . Diabetes mellitus    type II  . DKA (diabetic ketoacidoses) ([place]) 01/12/2019  . DVT (deep venous thrombosis) ([place]) 09/2015   bilateral  . Edema, lower extremity   . Glaucoma   . Hyperlipidemia   . Hypertension   . Hypoglycemia 09/09/2012  . Insomnia   . Insulin dependent diabetes mellitus ([place]) 04/23/2012  . Ischemic cardiomyopathy    EF 30-35%, s/p ICD 4/08 by  Dr Leonia Reeves  . Joint pain   . LBBB (left bundle branch block) 09/24/2012  . Morbid obesity ([place]) 11/06/2012  . Obesity   . Oxygen dependent 2 L  . Pain in left knee   . Peripheral neuropathy   . 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 Sleep apnea    uses O2 at night  . Tinea   . Type 2 diabetes mellitus with hyperglycemia, with long-term current use of insulin ([place]) 04/20/2019  . Uncontrolled diabetes mellitus ([place]) 02/2019  . Ventricular tachycardia ([place]) 01/16/15   sustained VT terminated with ATP, CL 340 msec  . Vitamin B  12 deficiency 11/05/2018  . Vitamin D deficiency 11/05/2018    Current Medications: No outpatient medications have been marked as taking for the 09/29/19 encounter (Appointment) with [name] T, PA-C.     Allergies:   Lipitor  [atorvastatin calcium], Metformin and related, Atorvastatin, and Levofloxacin   Social History   Tobacco Use  . Smoking status: Never Smoker  . Smokeless tobacco: Never Used  Vaping Use  . Vaping Use: Never used  Substance Use Topics  . Alcohol use: No  . Drug use: No     Family Hx: The patient's family history includes Diabetes (age of onset: 55) in her mother; High blood pressure in her mother; Obesity in her mother; Other in an other family member; Stroke in her mother; Sudden death in her mother.  ROS   EKGs/Labs/Other Test Reviewed:    EKG:  EKG is *** ordered today.  The ekg ordered today demonstrates ***  Recent Labs: 01/19/2019: B Natriuretic Peptide 533.7; Magnesium 2.3 02/16/2019: TSH 1.380 02/19/2019: ALT 42 02/22/2019: Hemoglobin 9.9; Platelets 169 08/14/2019: BUN 18; Creatinine, Ser 0.95; Potassium 4.2; Sodium 140   Recent Lipid Panel Lab Results  Component Value Date/Time   CHOL 111 11/03/2018 12:05 PM   CHOL 90 12/22/2012 10:42 AM   TRIG 87 11/03/2018 12:05 PM   TRIG 85 12/22/2012 10:42 AM   HDL 30 (L) 11/03/2018 12:05 PM   HDL 33 (L) 12/22/2012 10:42 AM   CHOLHDL 3.6 08/05/2017 10:48 AM   CHOLHDL 4.2 04/25/2012 05:20 AM   LDLCALC 64 11/03/2018 12:05 PM   LDLCALC 40 12/22/2012 10:42 AM    Physical Exam:    VS:  There were no vitals taken for this visit.    Wt Readings from Last 3 Encounters:  07/16/19 218 lb 3.2 oz (99 kg)  06/29/19 222 lb (100.7 kg)  04/20/19 215 lb 3.2 oz (97.6 kg)     Physical Exam ***  ASSESSMENT & PLAN:    *** 1. Acute on chronic combined systolic and diastolic CHF (congestive heart failure) (HCC) EF 50 by echocardiogram in January 2021.  She has an ischemic cardiomyopathy.  NYHA II-IIb.   Recent ICM clinic evaluation demonstrates abnormal thoracic impedance suggesting fluid accumulation.  Her weight is gone up 3 pounds at home and she has noted worsening lower extremity swelling.  When she was admitted with acute kidney injury and DKA back in February, her furosemide, spironolactone and lisinopril were all discontinued.  She still has furosemide at home.  I have recommended that she take 20 mg daily for 3 days and then stop.  She has another ICM clinic follow-up next week.  I will obtain a BMET in 2 weeks.  If she does not require further furosemide  and her creatinine remains stable, I will try to reinitiate ACE inhibitor therapy with lisinopril 2.5 mg daily.  2. Coronary artery disease involving native coronary artery of native heart without angina pectoris History of CABG in 2007.  During her admission in January 2021, cardiac catheterization demonstrated patent bypass grafts.  She is not having anginal symptoms.  She is not on aspirin as she is on Rivaroxaban.  Continue rosuvastatin, carvedilol.  3. ICD (implantable cardioverter-defibrillator) in place Continue follow-up with EP as planned.  4. History of DVT (deep vein thrombosis) She is on long-term anticoagulation with Rivaroxaban.  5. Essential hypertension The patient's blood pressure is controlled on her current regimen.  Continue current therapy.     Dispo:  No follow-ups on file.   Medication Adjustments/Labs and Tests Ordered: Current medicines are reviewed at length with the patient today.  Concerns regarding medicines are outlined above.  Tests Ordered: No orders of the defined types were placed in this encounter.  Medication Changes: No orders of the defined types were placed in this encounter.   Signed, [name], PA-C  09/29/2019 8:23 AM    [facility] [address] [place], [place], [place]  [address] Phone: [phone]; Fax: [phone]

## 2019-10-02 ENCOUNTER — Other Ambulatory Visit: Payer: Self-pay | Admitting: Occupational Therapy

## 2019-10-03 ENCOUNTER — Other Ambulatory Visit: Payer: Self-pay

## 2019-10-03 NOTE — Patient Outreach (Signed)
Aging Gracefully Program  OT Initial Visit  10/03/2019  [name] 09-26-1950 [id number]  Visit:  1- Initial Visit  Start Time:  4403 End Time:  4742 Total Minutes:  60  CCAP: Typical Daily Routine: Typical Daily Routine:: Mrs Jaggers gets up usually between 7-8am, goes to bathroom and changes clothes, lets her dog out, sits on couch. Grand daughter comes around 9:00 and helps her with her wash her back and feet as well as gets her breakfast and does any IADLs she needs done around the house. She receives meals on wheels. For supper she may fix herself a sandwich or drinks ensure. What Types Of Care Problems Are You Having Throughout The Day?: Gettiing up from couch or bed sometimes. Washing up currently due to hurt her back recently, picking things up off the floor, putting on socks/ted hose/depends, getting around, getting in and out of the house What [place]?: Granddaughter helps with activies around the house as well as helping me with part of my bath, Meals on wheels What Do You Think Would Make Everyday Life Easier For You?: a ramp/deck on front of house, a walk in shower What Is A Good Day Like?: when I have less pain What Is A Bad Day Like?: when I have more pain Patient Reported Equipment: Patient Reported Equipment Currently Used: Rollator, Single [employer], Civil engineer, contracting Functional Mobility-Walk A Block: Walk A Block: Unable To Do Functional Mobility-Maintain Balance While Showering: Maintaining Balance While Showering: Unable To Do Functional Mobility-Stooping, Crouching, Kneeling To Retreive Item: Stooping, Crouching, or Kneeling To Retrieve Item: Unable To Do Functional Mobility-Bending From Standing Position To Pick Up Clothing Off The Floor: Bending Over From Standing Position To Pick Up Clothing Off The Floor: Unable To Do Functional Mobility-Reaching For Items Above Shoulder Level: Reaching For Items Above Shoulder Level: No  Difficulty Functional Mobility-Climb 1 Flight Of Stairs: Climb 1 Flight Of Stairs: Unable To Do Functional Mobility-Move In And Out Of Chair: Move In and Out Of A Chair: A Little Difficulty (Due to recently hurt her back) Functional Mobility-Move In And Out Of Bed: Move In and Out Of Bed: A Little Difficulty Functional Mobility-Move In And Out Of Bath/Shower: Move In And Out Of A Bath/Shower: Unable To Do Functional Mobility-Get On And Off Toilet: Getting Up From The Floor: Unable To Do Functional Mobility-Into And Out Of Car, Not Including Driving: Into  And Out Of Car, Not Including Driving: A Little Difficulty Functional Mobility-Other Mobility Difficulty:      Activities of Daily Living-Bathing/Showering: ADL-Bathing/Showering: A Little Difficulty Do You:: Use Personal Assistance Importance Of Learning New Strategies: A Little Activities of Daily Living-Personal Hygiene and Grooming: Personal Hygiene and Grooming: No Difficulty Activities of Daily Living-Toilet Hygiene: Toilet Hygiene: No Difficulty Activities of Daily Living-Put On And Take Off Undergarments (Incl. Fasteners): Put On And Take Off Undergarments (Incl. Fasteners): No Difficulty Activities of Daily Living-Put On And Take Off Shirt/Dress/Coat (Incl. Fasteners): Put On And Take Off Shirt/Dress/Coat (Incl. Fasteners): A Little Difficulty Activities of Daily Living-Put On And Take Off Socks And Shoes: Put On And Take Off Socks And  Shoes: Moderate Difficulty Other Comments:: grand-daughter helps as needed Activities of Daily Living-Feed Self: Feed Self: No Difficulty Activities of Daily Living-Rest And Sleep: Rest and Sleep: No Difficulty Activities of Daily Living-Sexual Activity: Sexual  Activity: N/A  Instrumental Activities of Daily Living-Light Homemaking (Laundry, Straightening Up, Vacuuming):  Do Light Homemaking (Laundry, Straightening Up, Vacuuming): Unable To Do Instrumental  Activities of Daily  Living-Making A Bed: Making a Bed: Unable To Do Instrumental Activities of Daily Living-Washing Dishes By Hand While Standing At The Sink: Washing Dishes By Hand While Standing At The Sink: A Little Difficulty Instrumental Activities of Daily Living-Grocery Shopping: Do Grocery Shopping: Unable To Do Instrumental Activities of Daily Living-Use Telephone: Use Telephone: No Difficulty Instrumental Activities of Daily Living-Financial Management: Financial Management: No Difficulty Instrumental Activities of Daily Living-Medications: Take Medications: No Difficulty Instrumental Activities of Daily Living-Health Management And Maintenance: Health Management & Maintenance: Moderate Difficulty Other Comments:: Due to currently hurt her back, prior to this she reports no issues Instrumental Activities of Daily Living-Meal Preparation and Clean-Up: Meal Preparation and Clean-Up: A Little Difficulty Other Comments:: She does very little meal prep.mostly clean up for what she has heated up or made a sandwich Instrumental Activities of Daily Living-Provide Care For Others/Pets: Care For Others/Pets: No Difficulty Instrumental Activities of Daily Living-Take Part In Organized Social Activities: Take Part In Organized Social Activities: N/A Instrumental Activities of Daily Living-Leisure Participation: Leisure Participation: N/A Other Comments:: Reports even prior to [place] she did not go out much  Readiness To Change Score:  Readiness to Change Score: 8.67  Home Environment Assessment: Outside Home Entry:: Steps--no ramp or grab bar Bathroom:: peeling ceiling, boards around tub coming away from wall Hallways:: center of hallway is higher than edges of hallway Smoke/CO2 Detector:: Does not have one Veterinary surgeon:: Does not have one Other Home Environment Concerns:: Ceiling in hallway is cracking; back door there are step as well  Durable Medical Equipment:    Patient  Education: Education Provided: Yes Education Details: Civil engineer, contracting read over it and we will discuss next visit any questions she has Person(s) Educated: Patient  Goals: Goals Addressed            This Visit's Progress   . Patient Stated       She would like to feel more safe with her mobility inside and outside her house. (upright rollator would be beneficial)    . Patient Stated       Pt would like to feel more safe up and down from her toilet (grab bar would help with this)    . Patient Stated       Safety in and out of her house (ramp/deck at front door and ramp v. rail at back door is recommended)    . Patient Stated       Would like to be more independent with her basic ADLs (reacher, sock aid, long sponge, and ted hose donner will help with this, as well as possibly walk in shower stall v. Tub bench)       Post Clinical Reasoning: Clinician View Of Client Situation:: Mrs. Plaskett normally does very well for herself; however she recently hurt her back by picking up something she shouldn't have (too heavy and awkward). She has her grand-daughter who comes in and helps her with mainly IADLs (tasks around the house), but does help her some with bathing and dressing.Mrs. Ebanks wears oxygen when she is sitting still, but takes off when up and about. She has a little dog (minature poodle) named Ivory Broad and there is another samll dog in the house (in a kennel). Mrs. Gruenberg gets around her house with a SPC, rollator, and "furniture/wall" walking. She has a hard time getting in and out of her house due to steps at both doors. She does a sponge bath due to she does not feel  safe getting into and out of her tub. Client View Of His/Her Situation:: Mrs. Ridings reports she normally feels good about her health, but due to recently hurting her back it has made it harder for her to do things for herself that she normally could do on her own.She reports she is looking forward to her back  getting better and to what this program can do for her. Next Visit Plan:: Pt asks that I bring the adaptive equipment for her next time I come

## 2019-10-03 NOTE — Patient Instructions (Signed)
Goals: Goals Addressed            This Visit's Progress   . Patient Stated       She would like to feel more safe with her mobility inside and outside her house. (upright rollator would be beneficial)    . Patient Stated       Pt would like to feel more safe up and down from her toilet (grab bar would help with this)    . Patient Stated       Safety in and out of her house (ramp/deck at front door and ramp v. rail at back door is recommended)    . Patient Stated       Would like to be more independent with her basic ADLs (reacher, sock aid, long sponge, and ted hose donner will help with this, as well as possibly walk in shower stall v. Tub bench)

## 2019-10-08 ENCOUNTER — Other Ambulatory Visit: Payer: Self-pay | Admitting: *Deleted

## 2019-10-08 NOTE — Patient Outreach (Signed)
Aging Gracefully Program  10/08/2019  [name] 1950/12/30 [id number]   Placed call to client to schedule initial Aging Gracefully RN home visit; HIPAA compliant voice mail message left for client, requesting return call back.  Plan:  Will re-attempt Aging Gracefully outreach to schedule RN Visit # 1, again next week if I do not hear back from client first  Oneta Rack, RN, BSN, [employer] Coordinator [facility] Care Management  [phone]

## 2019-10-15 ENCOUNTER — Other Ambulatory Visit: Payer: Self-pay | Admitting: *Deleted

## 2019-10-15 NOTE — Patient Outreach (Signed)
Aging Gracefully Program  10/15/2019  [name] 1950/11/26 [id number]  Placed call to client Link Snuffer to schedule initial Aging Gracefully RN home visit; identified myself and explained purpose of call; requested client provide HIPAA identifiers; person answering phone stated "you should know who you are calling," and promptly hung phone up on me.  Re-attempted call to number provided for client; again identified myself and re-explained purpose of call; explained to person answering phone that providing HIPAA identifiers is necessary for call to be completed and is federal requirements in health care; client finally provided HIPAA identifiers.  explained that each time I contact patient, I will always identify myself first, and that she will need to provide her name and DOB before I will proceed with the call; she verbalizes understanding/ agreement, once this was explained to her.    Visit scheduled with client for Tuesday November 03, 2019, between 2:00-3:00 pm; verified patient's address and explained that I would contact her by phone on day of scheduled visit prior to arriving at her home, to complete coronavirus questionnaire screening tool; confirmed that patient has a mask available at her home for use during scheduled visit.   Provided my direct contact information to patient, should she wish to contact me prior to scheduled home visit.   Plan:   Scheduled RN initial home visit on Tuesday November 03, 2019 between 2-3:00 pm  Oneta Rack, RN, BSN, [facility]  [phone]

## 2019-10-26 ENCOUNTER — Ambulatory Visit (INDEPENDENT_AMBULATORY_CARE_PROVIDER_SITE_OTHER): Payer: Medicare HMO

## 2019-10-26 DIAGNOSIS — I255 Ischemic cardiomyopathy: Secondary | ICD-10-CM

## 2019-10-26 DIAGNOSIS — I5042 Chronic combined systolic (congestive) and diastolic (congestive) heart failure: Secondary | ICD-10-CM

## 2019-10-27 ENCOUNTER — Ambulatory Visit (INDEPENDENT_AMBULATORY_CARE_PROVIDER_SITE_OTHER): Payer: Medicare HMO

## 2019-10-27 DIAGNOSIS — I509 Heart failure, unspecified: Secondary | ICD-10-CM | POA: Diagnosis not present

## 2019-10-27 DIAGNOSIS — Z95810 Presence of automatic (implantable) cardiac defibrillator: Secondary | ICD-10-CM | POA: Diagnosis not present

## 2019-10-27 LAB — CUP PACEART REMOTE DEVICE CHECK
Battery Remaining Longevity: 32 mo
Battery Remaining Percentage: 30 %
Battery Voltage: 2.87 V
Brady Statistic AP VP Percent: 1 %
Brady Statistic AP VS Percent: 1 %
Brady Statistic AS VP Percent: 1 %
Brady Statistic AS VS Percent: 99 %
Brady Statistic RA Percent Paced: 1 %
Brady Statistic RV Percent Paced: 1 %
Date Time Interrogation Session: 20211019015018
HighPow Impedance: 37 Ohm
HighPow Impedance: 37 Ohm
Implantable Lead Implant Date: 20080423
Implantable Lead Implant Date: 20080423
Implantable Lead Implant Date: 20140918
Implantable Lead Location: 753858
Implantable Lead Location: 753859
Implantable Lead Location: 753860
Implantable Lead Model: 5076
Implantable Lead Model: 6947
Implantable Pulse Generator Implant Date: 20140918
Lead Channel Impedance Value: 390 Ohm
Lead Channel Impedance Value: 440 Ohm
Lead Channel Pacing Threshold Amplitude: 0.5 V
Lead Channel Pacing Threshold Amplitude: 0.75 V
Lead Channel Pacing Threshold Pulse Width: 0.5 ms
Lead Channel Pacing Threshold Pulse Width: 0.5 ms
Lead Channel Sensing Intrinsic Amplitude: 1.5 mV
Lead Channel Sensing Intrinsic Amplitude: 12 mV
Lead Channel Setting Pacing Amplitude: 2 V
Lead Channel Setting Pacing Amplitude: 2.5 V
Lead Channel Setting Pacing Pulse Width: 0.5 ms
Lead Channel Setting Sensing Sensitivity: 0.5 mV
Pulse Gen Serial Number: 7070007

## 2019-10-28 NOTE — Progress Notes (Signed)
EPIC Encounter for ICM Monitoring  Patient Name: [name] is a 69 y.o. female Date: 10/28/2019 Primary Care Physican: Everardo Beals, NP Primary Cardiologist:Skains/Weaver PA Electrophysiologist: Allred 09/25/2019 Weight: 220 lbs  AT/AF Burden: <1% (taking Xarelto)  Transmission reviewed.   CorVue thoracic impedancenormal but was suggesting possible fluid accumulation from 10/5-10/14.  Not prescribed a diuretic  Labs: 07/17/2019 Creatinine 0.91, BUN 19, Potassium 4.3, Sodium 137, GFR 65-74 02/22/2019 Creatinine 1.10, BUN 12, Potassium 4.4, Sodium 131, GFR 52-60 01/27/2018 Creatinine 1.22, BUN 22, Potassium 5.3, Sodium 135, GFR 46-53  Recommendations:No changes.  Next ICM clinic phone appointment due:11/30/2019. Next 91 day remote transmission due:01/25/2020.   EP/Cardiology Office Visits:11/11/2019 withScott Bethesda.   Copy of ICM check sent to Dr.Allred.  3 month ICM trend: 10/27/2019    1 Year ICM trend:       [name], RN 10/28/2019 1:46 PM

## 2019-10-30 ENCOUNTER — Encounter: Payer: Self-pay | Admitting: Physician Assistant

## 2019-11-02 NOTE — Progress Notes (Signed)
Remote ICD transmission.   

## 2019-11-03 ENCOUNTER — Encounter: Payer: Self-pay | Admitting: *Deleted

## 2019-11-03 ENCOUNTER — Other Ambulatory Visit: Payer: Self-pay | Admitting: *Deleted

## 2019-11-03 ENCOUNTER — Other Ambulatory Visit: Payer: Self-pay

## 2019-11-03 NOTE — Patient Outreach (Signed)
Aging Psychiatrist Visit # 1  11/03/2019  Bascom Levels [place] Bells 14-Oct-1950 [id number]  Visit:   RN Visit # 1  Start Time:   14:30 End Time:   16:05 Total Minutes:   95  Readiness To Change Score:  Readiness to Change Score: 8.33  Universal RN Interventions: Calendar Distribution: Yes Exercise Review: Yes (modified around patient's baseline abilities) Medications: Yes Medication Changes: No Mood: Yes Pain: Yes PCP Advocacy/Support: Yes Fall Prevention: Yes Incontinence: Yes  Healthcare Provider Communication: Did Higher education careers adviser With [employer] Healthcare Provider?: No  Clinician View of Client Situation: Public affairs consultant Of Client Situation: Moorea has numerous medical issues and challenges and has experienced a recent fall without serious injury; she has chronic knee and back pain which limits her mobility and ability to perform ADL's/ iADL's; additonally she is incontinent of urine, which she has managed for several years.  She has a very supportive and involved family that assist with her care needs as indicated, on a daily basis.  Macarena potentially has numerous environmental fall hazards at her home which could be modified to make her less prone to falls, such as loose rugs and clutter/ objects along floor pathways.  Brittan is motivated to work towards fall prevention as an Barista target area, and is unable to perfrom standing exercises unless she is supervised while performing.   Client's View of His/Her Situation: Client View Of His/Her Situation: "I know I have struggles and challenges but I want to do the things that I can do to keep myself as independent as possible.  I know they can't do anything for my knee and back pain, so it's something I have to learn to live with the best as I can.  I hope that I can keep myself from having any more falls and get back some of my independence; and hopefully, get stronger with the help of all the work they are doing around the  house."  Medication Assessment: Do You Have Any Problems Paying For Medications?: No Where Does Client Store Medications?: Other: ("I keep all of my medicines in this bag" per client report, she does not like using pill box) Can Client Read Pill Bottles?: Yes Does Client Use A Pillbox?: No Does Anyone Assist Client In Taking Medications?: No Do You Take Vitamin D?: Yes Total Number Of Medications That The Client Takes: 19 Does Client Have Any Questions Or Concerns About Medictions?: No Is Client Complaining Of Any Symptoms That Could Be Side Effects To Medications?: No Any Possible Changes In Medication Regimen?: No  OT Update: Will update Aging Gracefully team around successful completion of RN Visit # 1  Session Summary: Pleasant Aging Gracefully initial RN visit.  Freddi has numerous Aging Gracefully target areas to consider, including chronic pain, urinary incontinence, and fall prevention; there are several fall hazards currently present in her home environment.  She is enjoying the newly installed ramp and is looking forward to additional home modifications.  She appears motivated to remain as independent as possible as she ages, and hopes to be able to remain in her home as long as possible.  Oneta Rack, RN, BSN, [employer] [facility] Care Management  [phone]

## 2019-11-04 NOTE — Patient Instructions (Signed)
[name], it was so nice to meet you today!  Please review the action plan below frequently and be thinking of other things you can do to prevent yourself from having a fall-- we will talk about these in future visits  I have scheduled my next RN visit with you for Tuesday December 08, 2019, as a phone call.  I will call you between 10-11:00 am, so be listening out for me!  I look forward to hearing about your progress next time we talk.    CLIENT/RN ACTION PLAN - FALL PREVENTION  Registered Nurse:  [name], RN Date: 11/03/19  [name] Client ID:  dob 02/24/2050   Target Area:  FALL PREVENTION    Why Problem May Occur: -- previous history of falls -- limited mobility -- chronic knee and back pain -- some medications that you currently take -- environmental variables in your home: loose rugs with wrinkles/ creases, etc  Target Goal: to prevent falls in the future   STRATEGIES Coping Strategies Ideas  Change Positions Slowly: lying to sitting, sitting to standing MOVE SLOWLY DO NOT RUSH- for anything USE CANE/ WALKER . Changing positions can make people lightheaded. . When getting up in the morning sit for a few minutes, before standing.  . Stand for a few minutes before walking and hold on to sturdy furniture or countertop.  .   Drink water- don't allow yourself to become dehydrated . Dehydration can make people dizzy. . Ask your healthcare provider how much water you can drink. . Decrease caffeine, caffeine makes you urinate a lot, which can make you dehydrated.   If you fall, tell someone, and tell them what caused you to fall:  -- write down any falls that occur on your calendar: sometimes internal injuries may take awhile to show symptoms, so you don't want to forget the details of the fall . Tell a friend or family member even if you didn't get hurt. . Tell your healthcare provider if you fall.  They can help you figure out why.   Get your vision and hearing  checked -- keep up the good work going to regular eye exams . Poor vision and hearing loss can make people fall. . Glaucoma and diabetes can cause poor vision. .   Other .    Prevention Ideas  Review your Medicines -- NTG can cause your blood pressure to drop: always sit down if you need to use this medication -- Insulin can cause a quick drop in your blood sugar which could lead to falls-- this is especially true with your meal time insulin -- pain medication can make you feel sleepy  . Many medicines can make you dizzy or sleepy and increase your risk of falling. . Your Aging Gracefully Nurse will look at your medications and see if you are taking any that might cause falling.   Activity and Exercise -- stay as active as possible, make time for regular exercises and stretching; try to incorporate into your regular routine -- start gradually and build up over time -- do not over-do on exercise . Aging Gracefully exercises- do the ones that you are able to do while sitting; if you try the standing exercises, make sure your family is with you by your side . Walking (inside or outside) . Dancing . Gardening . Housework:  cooking, Education administrator, Medical sales representative . Exercise while watching TV . Swimming or water aerobics. .   Strengthen Bones- continue sitting in sun regularly --  ask your doctor if you should add extra Calcium to the the Vitamin D you are already taking- calcium helps the vitamin D be absorbed better . Exercise makes your bones stronger. . Vitamin D and Calcium make your bones stronger.  Ask your Healthcare provider if you should be taking them. . Your body makes vitamin D from the sun.  Sit in the sun for 10 minutes every day (without sunscreen). .   Control your urine  -- try the Kegel exercises in the list of exercises that was provided -- talk to your doctor about possible treatments for urinary incontinence/ bladder control . Prevent constipation . Cut back on caffeinated  drinks. . Don't wait to urinate. . If diabetic, control your blood sugar. . Ask your Aging Gracefully Nurse and Healthcare Provider  about urine control. .   Control your blood sugar (if you are diabetic) -- be very careful if you have drops in your blood sugar-- if this starts happening more frequently be sure to tell your doctor . High blood sugar can cause frequent urination, poor vision, and numbness in your feet, which can make you fall.   . Low blood sugar can cause confusion and dizziness. .   Other coping strategies 1. Be aware of weather conditions at all times and modify your activities accordingly 2.  Always wear sturdy shoes with good soles that will not slip 3.  Consider removing the loose rugs in your home and clearing out the pathways in your home so there are no fall hazards to trip you up 4. 5.   PRACTICE It is important to practice the strategies so we can determine if they will be effective in helping to reach the goal.    Follow these specific recommendations:        If strategy does not work the first time, try it again.     We may make some changes over the next few sessions.      Oneta Rack, RN, BSN, [employer] [facility] Care Management  [phone]

## 2019-11-11 ENCOUNTER — Other Ambulatory Visit: Payer: Self-pay

## 2019-11-11 ENCOUNTER — Telehealth (INDEPENDENT_AMBULATORY_CARE_PROVIDER_SITE_OTHER): Payer: Medicare HMO | Admitting: Physician Assistant

## 2019-11-11 ENCOUNTER — Encounter: Payer: Self-pay | Admitting: Physician Assistant

## 2019-11-11 VITALS — BP 127/73 | Ht 63.0 in | Wt 220.0 lb

## 2019-11-11 DIAGNOSIS — Z95810 Presence of automatic (implantable) cardiac defibrillator: Secondary | ICD-10-CM | POA: Diagnosis not present

## 2019-11-11 DIAGNOSIS — I472 Ventricular tachycardia, unspecified: Secondary | ICD-10-CM

## 2019-11-11 DIAGNOSIS — E785 Hyperlipidemia, unspecified: Secondary | ICD-10-CM

## 2019-11-11 DIAGNOSIS — I10 Essential (primary) hypertension: Secondary | ICD-10-CM

## 2019-11-11 DIAGNOSIS — I2510 Atherosclerotic heart disease of native coronary artery without angina pectoris: Secondary | ICD-10-CM

## 2019-11-11 DIAGNOSIS — I5042 Chronic combined systolic (congestive) and diastolic (congestive) heart failure: Secondary | ICD-10-CM | POA: Diagnosis not present

## 2019-11-11 DIAGNOSIS — Z86718 Personal history of other venous thrombosis and embolism: Secondary | ICD-10-CM

## 2019-11-11 MED ORDER — SPIRONOLACTONE 25 MG PO TABS: 12.5000 mg | ORAL_TABLET | Freq: Every day | ORAL | 3 refills | Status: DC

## 2019-11-11 MED ORDER — LISINOPRIL 2.5 MG PO TABS
2.5000 mg | ORAL_TABLET | Freq: Every day | ORAL | 1 refills | Status: DC
Start: 2019-11-11 — End: 2020-03-28

## 2019-11-11 NOTE — Patient Instructions (Addendum)
Medication Instructions:  Your physician has recommended you make the following change in your medication:   1) Start Spironolactone 25 mg, take 0.5 tablet by mouth once a day  *If you need a refill on your cardiac medications before your next appointment, please call your pharmacy*  Lab Work: Your physician recommends that you return for lab work in 2 weeks on 11/25/19  Your physician recommends that you return for lab work in 3 weeks on 12/02/19  If you have labs (blood work) drawn today and your tests are completely normal, you will receive your results only by: Marland Kitchen MyChart Message (if you have MyChart) OR . A paper copy in the mail If you have any lab test that is abnormal or we need to change your treatment, we will call you to review the results.   Testing/Procedures: None ordered today  Follow-Up: At [facility], you and your health needs are our priority.  As part of our continuing mission to provide you with exceptional heart care, we have created designated Provider Care Teams.  These Care Teams include your primary Cardiologist (physician) and Advanced Practice Providers (APPs -  Physician Assistants and Nurse Practitioners) who all work together to provide you with the care you need, when you need it.  Your next appointment:   6 month(s)  The format for your next appointment:   In Person  Provider:   Candee Furbish, MD

## 2019-11-11 NOTE — Progress Notes (Signed)
Virtual Visit via Telephone Note   This visit type was conducted due to national recommendations for restrictions regarding the COVID-19 Pandemic (e.g. social distancing) in an effort to limit this patient's exposure and mitigate transmission in our community.  Due to her co-morbid illnesses, this patient is at least at moderate risk for complications without adequate follow up.  This format is felt to be most appropriate for this patient at this time.  The patient did not have access to video technology/had technical difficulties with video requiring transitioning to audio format only (telephone).  All issues noted in this document were discussed and addressed.  No physical exam could be performed with this format.  Please refer to the patient's chart for her  consent to telehealth for [facility].    Date:  11/11/2019   ID:  [name], DOB 11/23/1950, MRN [id number] The patient was identified using 2 identifiers.  Patient Location: Home Provider Location: Home Office  PCP:  Everardo Beals, NP  Cardiologist:  Candee Furbish, MD   Electrophysiologist:  Thompson Grayer, MD   Evaluation Performed:  Follow-Up Visit  Chief Complaint:  Follow-up (CAD, CHF)    Patient Profile: [name] is a 69 y.o. female with:  Coronary artery disease  ? S/p CABG in 2007 ? Cath 01/2019: patent bypass grafts   Systolic CHF ? Followed in [facility] clinic  ? Ischemic CM ? Echocardiogram 3/17: EF 40-45 ? Echocardiogram 01/2019: EF 50  Ventricular tachycardia ? S/p ICD (CRT unsuccessful 2/2 vein sclerosis) ? Admx 1/21 w/ gallstone pancreatitis >> s/p 1 shock due to polymorphic VT (cath w/ stable anatomy)  Diabetes mellitus   Hypertension   DVT ? admx 9/17 w/ extensive bilat DVT >> thrombolysis (intravascular tPA, stenting to L CIV/EIV) ? Rivaroxaban Rx  OSA  Prior CV Studies: Cardiac catheterization 01-22-2019 LAD mid diffuse 80; D1 100 RI 100 LCx prox 100; OM1 mod dz; OM2 mod  dz RCA prox 100; RPDA mod dz; RPAV mod dz S-OM1/OM2 patent S-RPDA patent  S-RI patent  L-D1 patent EF 25-35  Myoview 01/17/2019 Fixed defects in septum, apex, ant wall, post wall and post-lat wall Possible small amount of reversibility in ant septum and ant wall (subtle) EF 20  High risk  Echocardiogram 01/13/2019 EF 50, poss apical WMA, mild LVH, mild MAC  Echo 03/13/15 Mild to mod LVH, EF 40-45%, no RWMA, Gr 1 DD  Myoview 5/16 Findings consistent with prior myocardial infarction. This is a high risk study. The left ventricular ejection fraction is severely decreased (<30%). Defect 1: There is a large defect of severe severity present in the mid inferoseptal and apex location.  [place] 12/11 EF 30-35% L-D2 patent S-RI patent S-OM1/OM2 patent S-PDA patent   History of Present Illness:   [name] was last seen in clinic in 9/21.  She is seen today for follow up.  Overall, she has been doing well.  She sleeps on 2 pillows chronically.  She does note shortness of breath with activity around her home.  She uses oxygen when she ambulates.  She has not had chest discomfort, syncope.  She has mild lower extremity swelling.  Her weights have been stable.  Past Medical History:  Diagnosis Date  . Abnormal LFTs   . AICD (automatic cardioverter/defibrillator) present   . Angina decubitus ([place]) 11/06/2012  . Angina pectoris ([place])   . Anxiety   . Arthritis   . Back pain   . Bundle branch block 05/2016  . CAD (  coronary artery disease) 06/18/2010  . CHF (congestive heart failure) ([place])   . Chronic combined systolic and diastolic CHF, NYHA class 3 ([place])   . Chronic combined systolic and diastolic heart failure ([place])   . Chronic systolic dysfunction of left ventricle 09/24/2012  . Coronary artery disease    s/p CABG 2007  . Depression   . Dermal mycosis   . Diabetes mellitus    type II  . DKA (diabetic ketoacidoses) 01/12/2019  . DVT (deep venous thrombosis) ([place]) 09/2015    bilateral  . Edema, lower extremity   . Glaucoma   . Hyperlipidemia   . Hypertension   . Hypoglycemia 09/09/2012  . Insomnia   . Insulin dependent diabetes mellitus ([place]) 04/23/2012  . Ischemic cardiomyopathy    EF 30-35%, s/p ICD 4/08 by Dr Leonia Reeves  . Joint pain   . LBBB (left bundle branch block) 09/24/2012  . Morbid obesity ([place]) 11/06/2012  . Obesity   . Oxygen dependent 2 L  . Pain in left knee   . Peripheral neuropathy   . 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 Sleep apnea    uses O2 at night  . Tinea   . Type 2 diabetes mellitus with hyperglycemia, with long-term current use of insulin ([place]) 04/20/2019  . Uncontrolled diabetes mellitus ([place]) 02/2019  . Ventricular tachycardia ([place]) 01/16/15   sustained VT terminated with ATP, CL 340 msec  . Vitamin B 12 deficiency 11/05/2018  . Vitamin D deficiency 11/05/2018   Past Surgical History:  Procedure Laterality Date  . BI-VENTRICULAR IMPLANTABLE CARDIOVERTER DEFIBRILLATOR UPGRADE N/A 09/25/2012   Procedure: BI-VENTRICULAR IMPLANTABLE CARDIOVERTER DEFIBRILLATOR UPGRADE;  Surgeon: Coralyn Mark, MD;  Location: [facility] CATH LAB;  Service: Cardiovascular;  Laterality: N/A;  . BREAST BIOPSY Right 04/05/2014  . CARDIAC CATHETERIZATION    . CARDIAC DEFIBRILLATOR PLACEMENT  4/08   by Dr Leonia Reeves (MDT)  . COLONOSCOPY WITH PROPOFOL N/A 10/12/2016   Procedure: COLONOSCOPY WITH PROPOFOL;  Surgeon: Doran Stabler, MD;  Location: [facility] ENDOSCOPY;  Service: Gastroenterology;  Laterality: N/A;  . CORONARY ARTERY BYPASS GRAFT  10/05/2005   by Dr Cyndia Bent  . IMPLANTABLE CARDIOVERTER DEFIBRILLATOR IMPLANT  09/25/12   attempt of upgrade to CRT-D unsuccessful due to CS anatomy, SJM Unify Asaura device placed with LV port capped by Dr Rayann Heman  . IR GENERIC HISTORICAL  10/03/2015   IR US GUIDE VASC ACCESS LEFT 10/03/2015 Jacqulynn Cadet, MD [facility]  . IR GENERIC  HISTORICAL  10/03/2015   IR VENO/EXT/UNI LEFT 10/03/2015 Jacqulynn Cadet, MD [facility]  . IR GENERIC HISTORICAL  10/03/2015   IR VENOCAVAGRAM IVC 10/03/2015 Jacqulynn Cadet, MD [facility]  . IR GENERIC HISTORICAL  10/03/2015   IR INFUSION THROMBOL VENOUS INITIAL (MS) 10/03/2015 Jacqulynn Cadet, MD [facility]  . IR GENERIC HISTORICAL  10/03/2015   IR US GUIDE VASC ACCESS LEFT 10/03/2015 Jacqulynn Cadet, MD [facility]  . IR GENERIC HISTORICAL  10/04/2015   IR THROMBECT VENO MECH MOD SED 10/04/2015 Sandi Mariscal, MD [facility]  . IR GENERIC HISTORICAL  10/04/2015   IR TRANSCATH PLC STENT 1ST ART NOT LE CV CAR VERT CAR 10/04/2015 Sandi Mariscal, MD [facility]  . IR GENERIC HISTORICAL  10/04/2015   IR THROMB F/U EVAL ART/VEN FINAL DAY (MS) 10/04/2015 Sandi Mariscal, MD [facility]  . IR GENERIC HISTORICAL  11/02/2015  IR RADIOLOGIST EVAL & MGMT 11/02/2015 Sandi Mariscal, MD [facility] INTER[facility]  . IR RADIOLOGIST EVAL & MGMT  04/26/2016  . LEFT HEART CATH AND CORS/GRAFTS ANGIOGRAPHY N/A 01/19/2019   Procedure: LEFT HEART CATH AND CORS/GRAFTS ANGIOGRAPHY;  Surgeon: Belva Crome, MD;  Location: [place] CV LAB;  Service: Cardiovascular;  Laterality: N/A;     Current Meds  Medication Sig  . albuterol (PROVENTIL HFA;VENTOLIN HFA) 108 (90 BASE) MCG/ACT inhaler Inhale 2 puffs into the lungs every 6 (six) hours as needed for wheezing or shortness of breath.  Marland Kitchen albuterol (PROVENTIL) (2.5 MG/3ML) 0.083% nebulizer solution Take 2.5 mg by nebulization every 6 (six) hours as needed for wheezing or shortness of breath.   Marland Kitchen amitriptyline (ELAVIL) 10 MG tablet Take 10 mg by mouth daily.  . Capsaicin (MUSCLE RELIEF EX) Apply 1 application topically daily. For pain  . carvedilol (COREG) 6.25 MG tablet Take 1 tablet (6.25 mg total) by mouth 2 (two) times daily with a meal.  . Continuous Blood Gluc Receiver (FREESTYLE LIBRE 14 DAY READER) DEVI 1 Device by Does not apply route as directed.  . Continuous Blood Gluc  Sensor (FREESTYLE LIBRE 14 DAY SENSOR) MISC 1 Device by Does not apply route as directed.  . ferrous sulfate 325 (65 FE) MG EC tablet Take 325 mg by mouth daily.   . fluconazole (DIFLUCAN) 150 MG tablet Take 150 mg by mouth every 7 (seven) days.  . insulin degludec (TRESIBA FLEXTOUCH) 100 UNIT/ML FlexTouch Pen Inject 0.22 mLs (22 Units total) into the skin daily.  . Insulin Pen Needle 32G X 4 MM MISC 1 Device by Does not apply route in the morning, at noon, in the evening, and at bedtime.  . isosorbide dinitrate (ISORDIL) 30 MG tablet Take 30 mg by mouth every morning.  . latanoprost (XALATAN) 0.005 % ophthalmic solution Place 1 drop into both eyes at bedtime.  Marland Kitchen lisinopril (ZESTRIL) 2.5 MG tablet Take 1 tablet (2.5 mg total) by mouth daily.  . meloxicam (MOBIC) 15 MG tablet Take 15 mg by mouth daily.  . methocarbamol (ROBAXIN) 500 MG tablet Take 1 tablet (500 mg total) by mouth 2 (two) times daily as needed for muscle spasms.  . nitroGLYCERIN (NITROSTAT) 0.4 MG SL tablet Place 0.4 mg under the tongue every 5 (five) minutes as needed for chest pain.  Marland Kitchen NOVOLOG FLEXPEN 100 UNIT/ML FlexPen Inject 6 Units into the skin 3 (three) times daily with meals.  . rosuvastatin (CRESTOR) 20 MG tablet Take 1 tablet (20 mg total) by mouth daily at 6 PM.  . Semaglutide, 1 MG/DOSE, (OZEMPIC, 1 MG/DOSE,) 2 MG/1.5ML SOPN Inject 0.75 mLs (1 mg total) into the skin once a week.  . traMADol (ULTRAM) 50 MG tablet Take 50 mg by mouth 2 (two) times daily.  . Vitamin D, Ergocalciferol, (DRISDOL) 1.25 MG (50000 UT) CAPS capsule Take 1 capsule (50,000 Units total) by mouth every 7 (seven) days.  Alveda Reasons 20 MG TABS tablet Take 20 mg by mouth daily.      Allergies:   Lipitor  [atorvastatin calcium], Metformin and related, Atorvastatin, and Levofloxacin   Social History   Tobacco Use  . Smoking status: Never Smoker  . Smokeless tobacco: Never Used  Vaping Use  . Vaping Use: Never used  Substance Use Topics  .  Alcohol use: No  . Drug use: No     Family Hx: The patient's family history includes Diabetes (age of onset: 57) in her mother; High blood pressure  in her mother; Obesity in her mother; Other in an other family member; Stroke in her mother; Sudden death in her mother.  ROS:   Please see the history of present illness.    Labs/Other Tests and Data Reviewed:    EKG:  No ECG reviewed.   IC[facility] Clinic 10/27/19 CorVue thoracic impedancenormal but was suggesting possible fluid accumulation from 10/5-10/14.  Recent Labs: 01/19/2019: B Natriuretic Peptide 533.7; Magnesium 2.3 02/16/2019: TSH 1.380 02/19/2019: ALT 42 02/22/2019: Hemoglobin 9.9; Platelets 169 08/14/2019: BUN 18; Creatinine, Ser 0.95; Potassium 4.2; Sodium 140   Recent Lipid Panel Lab Results  Component Value Date/Time   CHOL 111 11/03/2018 12:05 PM   CHOL 90 12/22/2012 10:42 AM   TRIG 87 11/03/2018 12:05 PM   TRIG 85 12/22/2012 10:42 AM   HDL 30 (L) 11/03/2018 12:05 PM   HDL 33 (L) 12/22/2012 10:42 AM   CHOLHDL 3.6 08/05/2017 10:48 AM   CHOLHDL 4.2 04/25/2012 05:20 AM   LDLCALC 64 11/03/2018 12:05 PM   LDLCALC 40 12/22/2012 10:42 AM    Wt Readings from Last 3 Encounters:  11/11/19 220 lb (99.8 kg)  07/16/19 218 lb 3.2 oz (99 kg)  06/29/19 222 lb (100.7 kg)     Risk Assessment/Calculations:      Objective:    Vital Signs:  BP 127/73   Ht _0  (1.6 m)   Wt 220 lb (99.8 kg)   BMI 38.97 kg/m    VITAL SIGNS:  reviewed GEN:  no acute distress PSYCH:  normal affect  ASSESSMENT & PLAN:    1. Chronic combined systolic and diastolic heart failure (HCC) EF has improved to 50% by echocardiogram in 01/2019.  She has an ischemic cardiomyopathy.  NYHA IIb-III.  Overall, volume status seems to be stable.  She has close follow-up in the [facility] clinic.  She has stable weights and minimal lower extremity swelling.  She was previously taken off of lisinopril and spironolactone when she was admitted to the hospital with AKI in  the setting of DKA.  I think her blood pressure can certainly tolerate the addition of spironolactone at 12.5 mg daily.  We discussed the importance of this medication.  -Continue current dose of carvedilol, lisinopril, isosorbide  -Start spironolactone 12.5 mg daily  -BMET weekly x2  -Follow-up with Dr. Marlou Porch in 6 months  2. Coronary artery disease involving native coronary artery of native heart without angina pectoris History of CABG in 2007.  Cardiac catheterization January 2021 demonstrated patent bypass grafts.  She is not having anginal symptoms.  She is not on aspirin as she is on Rivaroxaban.  Continue beta-blocker, statin.  3. Ventricular tachycardia ([place]) 4. AICD (automatic cardioverter/defibrillator) present Continue follow-up with EP as planned.  5. Essential hypertension The patient's blood pressure is controlled on her current regimen.  Continue current therapy.   6. Hyperlipidemia, unspecified hyperlipidemia type LDL optimal on most recent lab work.  Continue current Rx.    7. History of DVT (deep vein thrombosis) She seems to be tolerating anticoagulation.  This is managed by primary care.    Time:   Today, I have spent 8 minutes with the patient with telehealth technology discussing the above problems.     Medication Adjustments/Labs and Tests Ordered: Current medicines are reviewed at length with the patient today.  Concerns regarding medicines are outlined above.   Tests Ordered: Orders Placed This Encounter  Procedures  . Basic metabolic panel  . Basic metabolic panel    Medication Changes: Meds  ordered this encounter  Medications  . spironolactone (ALDACTONE) 25 MG tablet    Sig: Take 0.5 tablets (12.5 mg total) by mouth daily.    Dispense:  45 tablet    Refill:  3    Follow Up:  In Person in 6 month(s)  Signed, [name], PA-C  11/11/2019 5:00 PM    [place]

## 2019-11-12 ENCOUNTER — Other Ambulatory Visit: Payer: Self-pay | Admitting: Cardiology

## 2019-11-19 ENCOUNTER — Encounter: Payer: Self-pay | Admitting: Internal Medicine

## 2019-11-19 ENCOUNTER — Ambulatory Visit (INDEPENDENT_AMBULATORY_CARE_PROVIDER_SITE_OTHER): Payer: Medicare HMO | Admitting: Internal Medicine

## 2019-11-19 ENCOUNTER — Other Ambulatory Visit: Payer: Self-pay

## 2019-11-19 VITALS — BP 130/80 | HR 95 | Ht 63.0 in | Wt 223.5 lb

## 2019-11-19 DIAGNOSIS — E1142 Type 2 diabetes mellitus with diabetic polyneuropathy: Secondary | ICD-10-CM | POA: Diagnosis not present

## 2019-11-19 DIAGNOSIS — E1159 Type 2 diabetes mellitus with other circulatory complications: Secondary | ICD-10-CM

## 2019-11-19 DIAGNOSIS — E1165 Type 2 diabetes mellitus with hyperglycemia: Secondary | ICD-10-CM

## 2019-11-19 DIAGNOSIS — Z794 Long term (current) use of insulin: Secondary | ICD-10-CM

## 2019-11-19 LAB — BASIC METABOLIC PANEL
BUN: 18 mg/dL (ref 6–23)
CO2: 29 mEq/L (ref 19–32)
Calcium: 9.8 mg/dL (ref 8.4–10.5)
Chloride: 104 mEq/L (ref 96–112)
Creatinine, Ser: 0.83 mg/dL (ref 0.40–1.20)
GFR: 71.78 mL/min (ref >60.00)
Glucose, Bld: 135 mg/dL — ABNORMAL HIGH (ref 70–99)
Potassium: 4.6 mEq/L (ref 3.5–5.1)
Sodium: 139 mEq/L (ref 135–145)

## 2019-11-19 LAB — POCT GLYCOSYLATED HEMOGLOBIN (HGB A1C): Hemoglobin A1C: 5.8 % — AB (ref 4.0–5.6)

## 2019-11-19 LAB — POCT GLUCOSE (DEVICE FOR HOME USE): Glucose Fasting, POC: 170 mg/dL — AB (ref 70–99)

## 2019-11-19 MED ORDER — DAPAGLIFLOZIN PROPANEDIOL 5 MG PO TABS: 5.0000 mg | ORAL_TABLET | Freq: Every day | ORAL | 6 refills | Status: DC

## 2019-11-19 NOTE — Progress Notes (Signed)
Name: [name]  Age/ Sex: 69 y.o., female   MRN/ DOB: [id number], [birth year]     PCP: Everardo Beals, NP   Reason for Endocrinology Evaluation: Type 2 Diabetes Mellitus  Initial Endocrine Consultative Visit: 04/20/2019    PATIENT IDENTIFIER: [name] is a 69 y.o. female with a past medical history of DM, HTN, Dyslipidemia and CAD.. The patient has followed with Endocrinology clinic since 04/20/2019 for consultative assistance with management of her diabetes.  DIABETIC HISTORY:  [name] was diagnosed with T2DM many years ago, she is intolerant to Metformin, has been on Glipizide without reported intolerance. Her hemoglobin A1c has ranged from 7.8% in 2017, peaking at 9.5% in 2014.  On her initial visit to our clinic she had an A1c of 7.7%  , she was on MDI regimen and Ozempic. Marland Kitchen We switched levemir to tresiba and continued Ozempic and Novolog.  SUBJECTIVE:   During the last visit (07/16/2019): A1c 6.2%, We reduced insulin dose and increased Ozempic   Today (11/19/2019): [name] is here for a follow up on diabetes.  She checks her blood sugar 3 times daily, preprandial. The patient has had hypoglycemic episodes since the last clinic visit, which typically occur 1 x /week - most often occuring during the day. The patient is  symptomatic with these episodes.  Denies nausea and diarrhea     HOME DIABETES REGIMEN:  Tresiba  22 units ONCE  Daily  Novolog 6 units with each meal - takes 6-10 units  Ozempic 1 mg once weekly     Statin: Yes ACE-I/ARB: No      GLUCOSE LOG 66- 316 mg/dL     DIABETIC COMPLICATIONS: Microvascular complications:   Neuropathy  Denies: CKD, retinopathy   Last Eye Exam: Completed 2019  Macrovascular complications:   CAD, CHF  Denies: CVA, PVD   HISTORY:  Past Medical History:  Past Medical History:  Diagnosis Date  . Abnormal LFTs   . AICD (automatic cardioverter/defibrillator) present   . Angina decubitus  ([place]) 11/06/2012  . Angina pectoris ([place])   . Anxiety   . Arthritis   . Back pain   . Bundle branch block 05/2016  . CAD (coronary artery disease) 06/18/2010  . CHF (congestive heart failure) ([place])   . Chronic combined systolic and diastolic CHF, NYHA class 3 ([place])   . Chronic combined systolic and diastolic heart failure ([place])   . Chronic systolic dysfunction of left ventricle 09/24/2012  . Coronary artery disease    s/p CABG 2007  . Depression   . Dermal mycosis   . Diabetes mellitus    type II  . DKA (diabetic ketoacidoses) 01/12/2019  . DVT (deep venous thrombosis) ([place]) 09/2015   bilateral  . Edema, lower extremity   . Glaucoma   . Hyperlipidemia   . Hypertension   . Hypoglycemia 09/09/2012  . Insomnia   . Insulin dependent diabetes mellitus ([place]) 04/23/2012  . Ischemic cardiomyopathy    EF 30-35%, s/p ICD 4/08 by Dr Leonia Reeves  . Joint pain   . LBBB (left bundle branch block) 09/24/2012  . Morbid obesity ([place]) 11/06/2012  . Obesity   . Oxygen dependent 2 L  . Pain in left knee   . Peripheral neuropathy   . 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 Sleep apnea    uses O2 at night  .  Tinea   . Type 2 diabetes mellitus with hyperglycemia, with long-term current use of insulin ([place]) 04/20/2019  . Uncontrolled diabetes mellitus ([place]) 02/2019  . Ventricular tachycardia ([place]) 01/16/15   sustained VT terminated with ATP, CL 340 msec  . Vitamin B 12 deficiency 11/05/2018  . Vitamin D deficiency 11/05/2018   Past Surgical History:  Past Surgical History:  Procedure Laterality Date  . BI-VENTRICULAR IMPLANTABLE CARDIOVERTER DEFIBRILLATOR UPGRADE N/A 09/25/2012   Procedure: BI-VENTRICULAR IMPLANTABLE CARDIOVERTER DEFIBRILLATOR UPGRADE;  Surgeon: Coralyn Mark, MD;  Location[facility] CATH LAB;  Service: Cardiovascular;  Laterality: N/A;  . BREAST BIOPSY Right 04/05/2014  . CARDIAC CATHETERIZATION    .  CARDIAC DEFIBRILLATOR PLACEMENT  4/08   by Dr Leonia Reeves (MDT)  . COLONOSCOPY WITH PROPOFOL N/A 10/12/2016   Procedure: COLONOSCOPY WITH PROPOFOL;  Surgeon: Doran Stabler, MD;  Location: [facility] ENDOSCOPY;  Service: Gastroenterology;  Laterality: N/A;  . CORONARY ARTERY BYPASS GRAFT  10/05/2005   by Dr Cyndia Bent  . IMPLANTABLE CARDIOVERTER DEFIBRILLATOR IMPLANT  09/25/12   attempt of upgrade to CRT-D unsuccessful due to CS anatomy, SJM Unify Asaura device placed with LV port capped by Dr Rayann Heman  . IR GENERIC HISTORICAL  10/03/2015   IR US GUIDE VASC ACCESS LEFT 10/03/2015 Jacqulynn Cadet, MD [facility]  . IR GENERIC HISTORICAL  10/03/2015   IR VENO/EXT/UNI LEFT 10/03/2015 Jacqulynn Cadet, MD [facility]  . IR GENERIC HISTORICAL  10/03/2015   IR VENOCAVAGRAM IVC 10/03/2015 Jacqulynn Cadet, MD [facility]  . IR GENERIC HISTORICAL  10/03/2015   IR INFUSION THROMBOL VENOUS INITIAL (MS) 10/03/2015 Jacqulynn Cadet, MD [facility]  . IR GENERIC HISTORICAL  10/03/2015   IR US GUIDE VASC ACCESS LEFT 10/03/2015 Jacqulynn Cadet, MD [facility]  . IR GENERIC HISTORICAL  10/04/2015   IR THROMBECT VENO MECH MOD SED 10/04/2015 Sandi Mariscal, MD [facility]  . IR GENERIC HISTORICAL  10/04/2015   IR TRANSCATH PLC STENT 1ST ART NOT LE CV CAR VERT CAR 10/04/2015 Sandi Mariscal, MD [facility]  . IR GENERIC HISTORICAL  10/04/2015   IR THROMB F/U EVAL ART/VEN FINAL DAY (MS) 10/04/2015 Sandi Mariscal, MD [facility]  . IR GENERIC HISTORICAL  11/02/2015   IR RADIOLOGIST EVAL & MGMT 11/02/2015 Sandi Mariscal, MD [facility]  . IR RADIOLOGIST EVAL & MGMT  04/26/2016  . LEFT HEART CATH AND CORS/GRAFTS ANGIOGRAPHY N/A 01/19/2019   Procedure: LEFT HEART CATH AND CORS/GRAFTS ANGIOGRAPHY;  Surgeon: Belva Crome, MD;  Location: [place] CV LAB;  Service: Cardiovascular;  Laterality: N/A;    Social History:  reports that she has never smoked. She has never used smokeless tobacco. She reports that she does not drink alcohol  and does not use drugs. Family History:  Family History  Problem Relation Age of Onset  . Diabetes Mother 8       died - HTN  . Stroke Mother   . High blood pressure Mother   . Sudden death Mother   . Obesity Mother   . Other Other        No early family hx of CAD     HOME MEDICATIONS: Allergies as of 11/19/2019      Reactions   Lipitor  [atorvastatin Calcium] Rash   Metformin And Related Itching, Swelling, Other (See Comments)   Leg pain & swelling in legs   Atorvastatin Itching, Rash   Levofloxacin Itching      Medication List       Accurate  as of November 19, 2019 10:39 AM. If you have any questions, ask your nurse or doctor.        albuterol 108 (90 Base) MCG/ACT inhaler Commonly known as: VENTOLIN HFA Inhale 2 puffs into the lungs every 6 (six) hours as needed for wheezing or shortness of breath.   albuterol (2.5 MG/3ML) 0.083% nebulizer solution Commonly known as: PROVENTIL Take 2.5 mg by nebulization every 6 (six) hours as needed for wheezing or shortness of breath.   amitriptyline 10 MG tablet Commonly known as: ELAVIL Take 10 mg by mouth daily.   carvedilol 6.25 MG tablet Commonly known as: COREG Take 1 tablet (6.25 mg total) by mouth 2 (two) times daily with a meal.   ferrous sulfate 325 (65 FE) MG EC tablet Take 325 mg by mouth daily.   fluconazole 150 MG tablet Commonly known as: DIFLUCAN Take 150 mg by mouth every 7 (seven) days.   FreeStyle Libre 14 Day Reader Kerrin Mo 1 Device by Does not apply route as directed.   FreeStyle Libre 14 Day Sensor Misc 1 Device by Does not apply route as directed.   Insulin Pen Needle 32G X 4 MM Misc 1 Device by Does not apply route in the morning, at noon, in the evening, and at bedtime.   isosorbide dinitrate 30 MG tablet Commonly known as: ISORDIL Take 30 mg by mouth every morning.   latanoprost 0.005 % ophthalmic solution Commonly known as: XALATAN Place 1 drop into both eyes at bedtime.    lisinopril 2.5 MG tablet Commonly known as: ZESTRIL Take 1 tablet (2.5 mg total) by mouth daily.   meloxicam 15 MG tablet Commonly known as: MOBIC Take 15 mg by mouth daily.   methocarbamol 500 MG tablet Commonly known as: ROBAXIN Take 1 tablet (500 mg total) by mouth 2 (two) times daily as needed for muscle spasms.   MUSCLE RELIEF EX Apply 1 application topically daily. For pain   nitroGLYCERIN 0.4 MG SL tablet Commonly known as: NITROSTAT Place 0.4 mg under the tongue every 5 (five) minutes as needed for chest pain.   NovoLOG FlexPen 100 UNIT/ML FlexPen Generic drug: insulin aspart Inject 6 Units into the skin 3 (three) times daily with meals.   Ozempic (1 MG/DOSE) 2 MG/1.5ML Sopn Generic drug: Semaglutide (1 MG/DOSE) Inject 0.75 mLs (1 mg total) into the skin once a week.   rosuvastatin 20 MG tablet Commonly known as: CRESTOR Take 1 tablet (20 mg total) by mouth daily at 6 PM.   spironolactone 25 MG tablet Commonly known as: ALDACTONE Take 0.5 tablets (12.5 mg total) by mouth daily.   traMADol 50 MG tablet Commonly known as: ULTRAM Take 50 mg by mouth 2 (two) times daily.   Tyler Aas FlexTouch 100 UNIT/ML FlexTouch Pen Generic drug: insulin degludec Inject 0.22 mLs (22 Units total) into the skin daily.   Vitamin D (Ergocalciferol) 1.25 MG (50000 UNIT) Caps capsule Commonly known as: DRISDOL Take 1 capsule (50,000 Units total) by mouth every 7 (seven) days.   Xarelto 20 MG Tabs tablet Generic drug: rivaroxaban Take 20 mg by mouth daily.        OBJECTIVE:   Vital Signs: BP 130/80   Pulse 95   Ht 5\' 3"  (1.6 m)   Wt 223 lb 8 oz (101.4 kg)   SpO2 99%   BMI 39.59 kg/m   Wt Readings from Last 3 Encounters:  11/19/19 223 lb 8 oz (101.4 kg)  11/11/19 220 lb (99.8 kg)  07/16/19 218 lb  3.2 oz (99 kg)     Exam: General: Pt appears well and is in NAD  Lungs: Clear with good BS bilat with no rales, rhonchi, or wheezes  Heart: RRR with normal S1 and S2  and no gallops; no murmurs; no rub  Extremities: 1+ pretibial edema.  Neuro: MS is good with appropriate affect, pt is alert and Ox3     DM Foot Exam 07/16/2019    The skin of the feet is intact without sores or ulcerations. The pedal pulses are 1+ on right and 1+ on left. The sensation is decreased to a screening 5.07, 10 gram monofilament bilaterally     DATA REVIEWED:  Lab Results  Component Value Date   HGBA1C 5.8 (A) 11/19/2019   HGBA1C 6.2 (A) 07/16/2019   HGBA1C 7.7 (A) 04/20/2019   Lab Results  Component Value Date   LDLCALC 64 11/03/2018   CREATININE 0.95 08/14/2019    Lab Results  Component Value Date   CHOL 111 11/03/2018   HDL 30 (L) 11/03/2018   LDLCALC 64 11/03/2018   TRIG 87 11/03/2018   CHOLHDL 3.6 08/05/2017       Results for BOBETTA, KORF (MRN [id number]) as of 11/20/2019 13:21  Ref. Range 11/19/2019 11:20  Sodium Latest Ref Range: 135 - 145 mEq/L 139  Potassium Latest Ref Range: 3.5 - 5.1 mEq/L 4.6  Chloride Latest Ref Range: 96 - 112 mEq/L 104  CO2 Latest Ref Range: 19 - 32 mEq/L 29  Glucose Latest Ref Range: 70 - 99 mg/dL 135 (H)  BUN Latest Ref Range: 6 - 23 mg/dL 18  Creatinine Latest Ref Range: 0.40 - 1.20 mg/dL 0.83  Calcium Latest Ref Range: 8.4 - 10.5 mg/dL 9.8  GFR Latest Ref Range: >60.00 mL/min 71.78    ASSESSMENT / PLAN / RECOMMENDATIONS:   1) Type 2 Diabetes Mellitus, Optimally controlled, With Neuropathic and  macrovascular complications - Most recent A1c of 5.8 %. Goal A1c < 7.0 %.    - Her A1c if 5.8 % which is discordant with her BG readings, I am going to check Fructosamine on her just to be sure.  - Pt noted with occasional hypoglycemia and hyperglycemia during the day, this is due to insulin -Cab mismatch as well as snacking at times  - Her GFR is normal  ,we discussed adding an SGLT-2 inhibitor , as she is  be a great candidate for it , given hx of CHF . Cautioned against genital  Infections - She will also be provided  with a correctional scale as below  - BMP normal     MEDICATIONS: - Decrease Tresiba  20 units ONCE  Daily  - Continue Novolog 6 units with each meal  - Continue  Ozempic 1 mg once weekly (Thursday) - Start Farxiga 5 mg daily  - Correction factor: Novolog ( BG-130/40)  EDUCATION / INSTRUCTIONS:  BG monitoring instructions: Patient is instructed to check her blood sugars 3 times a day, before meals  Call [place] [facility] if: BG persistently < 70 . I reviewed the Rule of 15 for the treatment of hypoglycemia in detail with the patient. Literature supplied.   2) Diabetic complications:   Eye: Does not have known diabetic retinopathy.   Neuro/ Feet: Does  have known diabetic peripheral neuropathy based on foot exam from 07/16/2019  Renal: Patient does not have known baseline CKD. She is not on an ACEI/ARB at present.   F/U in 3 months    Signed electronically  by: Mack Guise, MD  [facility] Endocrinology  [facility] [address] [place], [place] [address] Phone: [phone] FAX: [phone]   CC: Everardo Beals, NP [place] [address] Phone: [phone]  Fax: [phone]  Return to Endocrinology clinic as below: Future Appointments  Date Time Provider [place]  11/25/2019 10:15 AM [facility]  11/30/2019 10:50 AM [facility] DEVICE REMOTES [facility]  12/02/2019  8:15 AM [facility]  12/08/2019 10:30 AM Knox Royalty, RN [facility] None  01/25/2020  8:40 AM [facility] DEVICE REMOTES [facility]  04/25/2020  9:40 AM [facility] DEVICE REMOTES [facility]  07/25/2020  9:40 AM [facility] DEVICE REMOTES [facility]  10/24/2020  9:40 AM [facility] DEVICE REMOTES [facility]

## 2019-11-19 NOTE — Patient Instructions (Addendum)
-   Decrease Tresiba at 20 units ONCE  Daily  - Continue Novolog 6 units with each meal  - Continue Ozempic 1 mg once weekly  - Start Farxiga 5 mg, 1 tablet with Breakfast   -Novolog correctional insulin: ADD extra units on insulin to your meal-time Novolog dose if your blood sugars are higher than 210. Use the scale below to help guide you:   Blood sugar before meal Number of units to inject  Less than 210 0 unit  211 -  250 2 units  251-  290 3 units  291 -  330 4 units  331 -  370 5 units  371 -  410 6 units          HOW TO TREAT LOW BLOOD SUGARS (Blood sugar LESS THAN 70 MG/DL)  Please follow the RULE OF 15 for the treatment of hypoglycemia treatment (when your (blood sugars are less than 70 mg/dL)    STEP 1: Take 15 grams of carbohydrates when your blood sugar is low, which includes:   3-4 GLUCOSE TABS  OR  3-4 OZ OF JUICE OR REGULAR SODA OR  ONE TUBE OF GLUCOSE GEL     STEP 2: RECHECK blood sugar in 15 MINUTES STEP 3: If your blood sugar is still low at the 15 minute recheck --> then, go back to STEP 1 and treat AGAIN with another 15 grams of carbohydrates.

## 2019-11-23 LAB — FRUCTOSAMINE: Fructosamine: 274 umol/L (ref 205–285)

## 2019-11-25 ENCOUNTER — Other Ambulatory Visit: Payer: Medicare HMO | Admitting: *Deleted

## 2019-11-25 ENCOUNTER — Other Ambulatory Visit: Payer: Self-pay

## 2019-11-25 DIAGNOSIS — I472 Ventricular tachycardia, unspecified: Secondary | ICD-10-CM

## 2019-11-25 DIAGNOSIS — I2510 Atherosclerotic heart disease of native coronary artery without angina pectoris: Secondary | ICD-10-CM

## 2019-11-25 DIAGNOSIS — E785 Hyperlipidemia, unspecified: Secondary | ICD-10-CM

## 2019-11-25 DIAGNOSIS — I5042 Chronic combined systolic (congestive) and diastolic (congestive) heart failure: Secondary | ICD-10-CM

## 2019-11-25 DIAGNOSIS — Z95810 Presence of automatic (implantable) cardiac defibrillator: Secondary | ICD-10-CM

## 2019-11-25 DIAGNOSIS — I10 Essential (primary) hypertension: Secondary | ICD-10-CM

## 2019-11-25 DIAGNOSIS — Z86718 Personal history of other venous thrombosis and embolism: Secondary | ICD-10-CM

## 2019-11-25 LAB — BASIC METABOLIC PANEL
BUN/Creatinine Ratio: 22 (ref 12–28)
BUN: 21 mg/dL (ref 8–27)
CO2: 27 mmol/L (ref 20–29)
Calcium: 9.8 mg/dL (ref 8.7–10.3)
Chloride: 102 mmol/L (ref 96–106)
Creatinine, Ser: 0.95 mg/dL (ref 0.57–1.00)
GFR calc Af Amer: 71 mL/min/{1.73_m2} (ref >59)
GFR calc non Af Amer: 61 mL/min/{1.73_m2} (ref >59)
Glucose: 98 mg/dL (ref 65–99)
Potassium: 5.2 mmol/L (ref 3.5–5.2)
Sodium: 138 mmol/L (ref 134–144)

## 2019-11-30 ENCOUNTER — Ambulatory Visit (INDEPENDENT_AMBULATORY_CARE_PROVIDER_SITE_OTHER): Payer: Medicare HMO

## 2019-11-30 DIAGNOSIS — Z95810 Presence of automatic (implantable) cardiac defibrillator: Secondary | ICD-10-CM

## 2019-11-30 DIAGNOSIS — I5042 Chronic combined systolic (congestive) and diastolic (congestive) heart failure: Secondary | ICD-10-CM

## 2019-12-02 ENCOUNTER — Telehealth: Payer: Self-pay | Admitting: *Deleted

## 2019-12-02 ENCOUNTER — Other Ambulatory Visit: Payer: Self-pay

## 2019-12-02 ENCOUNTER — Other Ambulatory Visit: Payer: Medicare HMO | Admitting: *Deleted

## 2019-12-02 DIAGNOSIS — I2510 Atherosclerotic heart disease of native coronary artery without angina pectoris: Secondary | ICD-10-CM

## 2019-12-02 DIAGNOSIS — I5042 Chronic combined systolic (congestive) and diastolic (congestive) heart failure: Secondary | ICD-10-CM

## 2019-12-02 DIAGNOSIS — I10 Essential (primary) hypertension: Secondary | ICD-10-CM

## 2019-12-02 DIAGNOSIS — Z86718 Personal history of other venous thrombosis and embolism: Secondary | ICD-10-CM

## 2019-12-02 DIAGNOSIS — I472 Ventricular tachycardia, unspecified: Secondary | ICD-10-CM

## 2019-12-02 DIAGNOSIS — E785 Hyperlipidemia, unspecified: Secondary | ICD-10-CM

## 2019-12-02 DIAGNOSIS — Z95810 Presence of automatic (implantable) cardiac defibrillator: Secondary | ICD-10-CM

## 2019-12-02 LAB — BASIC METABOLIC PANEL
BUN/Creatinine Ratio: 23 (ref 12–28)
BUN: 30 mg/dL — ABNORMAL HIGH (ref 8–27)
CO2: 25 mmol/L (ref 20–29)
Calcium: 9.8 mg/dL (ref 8.7–10.3)
Chloride: 102 mmol/L (ref 96–106)
Creatinine, Ser: 1.29 mg/dL — ABNORMAL HIGH (ref 0.57–1.00)
GFR calc Af Amer: 49 mL/min/{1.73_m2} — ABNORMAL LOW (ref >59)
GFR calc non Af Amer: 42 mL/min/{1.73_m2} — ABNORMAL LOW (ref >59)
Glucose: 152 mg/dL — ABNORMAL HIGH (ref 65–99)
Potassium: 4.9 mmol/L (ref 3.5–5.2)
Sodium: 140 mmol/L (ref 134–144)

## 2019-12-02 NOTE — Telephone Encounter (Signed)
-----   Message from Liliane Shi, [place] sent at 12/02/2019  5:04 PM EST ----- Creatinine has increased. K+ normal.  PLAN:  - Stop Spironolactone. - Hold Lisinopril for 3 days, then resume. - BMET in 1 week. [name], PA-C    12/02/2019 5:00 PM

## 2019-12-02 NOTE — Progress Notes (Signed)
EPIC Encounter for ICM Monitoring  Patient Name: [name] is a 69 y.o. female Date: 12/02/2019 Primary Care Physican: Everardo Beals, NP Primary Cardiologist:Skains/Weaver PA Electrophysiologist: Allred 11/11/2019 Weight: 220lbs  AT/AF Burden: <1% (taking Xarelto)  Transmission reviewed.  CorVue thoracic impedancenormal.  Not prescribed a diuretic  Labs:  BMET scheduled for 12/02/2019 11/25/2019 Creatinine 0.95, BUN 21, Potassium 5.2, Sodium 138 11/19/2019 Creatinine 0.83, BUN 18, Potassium 4.6, Sodium 139, GFR 71.78  A complete set of results can be found in Results Review.  Recommendations:No changes.  Next ICM clinic phone appointment due:01/11/2020. Next 91 day remote transmission due:01/25/2020.   EP/Cardiology Office Visits: last visit with Dr Rayann Heman was 01/18/2017 and message sent to EP scheduler to call patient. Due for 6 month follow up with either Margaret Pyle or Dr Marlou Porch 05/10/2020.   Copy of ICM check sent to Dr.Allred.   3 month ICM trend: 11/26/2019      [name], RN 12/02/2019 9:24 AM

## 2019-12-02 NOTE — Telephone Encounter (Signed)
Patient notified. She will come in for BMP on 12/09/19

## 2019-12-08 ENCOUNTER — Encounter: Payer: Self-pay | Admitting: *Deleted

## 2019-12-08 ENCOUNTER — Other Ambulatory Visit: Payer: Self-pay | Admitting: *Deleted

## 2019-12-08 NOTE — Patient Instructions (Signed)
Goals Addressed            This Visit's Progress   . Aging Gracefully RN: Prevent falls   On track    CLIENT/RN [place] PREVENTION  Registered Nurse:  [name], RN Date: 12/08/19  [name] Client ID:  [id number] Feb 14, 2050   Target Area:  FALL PREVENTION    Why Problem May Occur: -- previous history of falls -- limited mobility -- chronic knee and back pain -- some medications that you currently take -- environmental variables in your home: loose rugs with wrinkles/ creases, etc  Target Goal: to prevent falls in the future   STRATEGIES Coping Strategies Ideas  Change Positions Slowly: lying to sitting, sitting to standing MOVE SLOWLY DO NOT RUSH- for anything USE CANE/ WALKER . Changing positions can make people lightheaded. . When getting up in the morning sit for a few minutes, before standing.  . Stand for a few minutes before walking and hold on to sturdy furniture or countertop.  .   Drink water- don't allow yourself to become dehydrated . Dehydration can make people dizzy. . Ask your healthcare provider how much water you can drink. . Decrease caffeine, caffeine makes you urinate a lot, which can make you dehydrated.   If you fall, tell someone, and tell them what caused you to fall:  -- write down any falls that occur on your calendar: sometimes internal injuries may take awhile to show symptoms, so you don't want to forget the details of the fall . Tell a friend or family member even if you didn't get hurt. . Tell your healthcare provider if you fall.  They can help you figure out why.   Get your vision and hearing checked -- keep up the good work going to regular eye exams . Poor vision and hearing loss can make people fall. . Glaucoma and diabetes can cause poor vision. .   Other .    Prevention Ideas  Review your Medicines -- NTG can cause your blood pressure to drop: always sit down if you need to use this medication -- Insulin can  cause a quick drop in your blood sugar which could lead to falls-- this is especially true with your meal time insulin -- pain medication can make you feel sleepy  . Many medicines can make you dizzy or sleepy and increase your risk of falling. . Your Aging Gracefully Nurse will look at your medications and see if you are taking any that might cause falling.   Activity and Exercise -- stay as active as possible, make time for regular exercises and stretching; try to incorporate into your regular routine -- start gradually and build up over time -- do not over-do on exercise . Aging Gracefully exercises- do the ones that you are able to do while sitting; if you try the standing exercises, make sure your family is with you by your side . Walking (inside or outside) . Dancing . Gardening . Housework:  cooking, Education administrator, Medical sales representative . Exercise while watching TV . Swimming or water aerobics. .   Strengthen Bones- continue sitting in sun regularly -- ask your doctor if you should add extra Calcium to the the Vitamin D you are already taking- calcium helps the vitamin D be absorbed better . Exercise makes your bones stronger. . Vitamin D and Calcium make your bones stronger.  Ask your Healthcare provider if you should be taking them. . Your body makes vitamin D from  the sun.  Sit in the sun for 10 minutes every day (without sunscreen). .   Control your urine  -- try the Kegel exercises in the list of exercises that was provided -- talk to your doctor about possible treatments for urinary incontinence/ bladder control . Prevent constipation . Cut back on caffeinated drinks. . Don't wait to urinate. . If diabetic, control your blood sugar. . Ask your Aging Gracefully Nurse and Healthcare Provider  about urine control. .   Control your blood sugar (if you are diabetic) -- be very careful if you have drops in your blood sugar-- if this starts happening more frequently be sure to tell your doctor  . High blood sugar can cause frequent urination, poor vision, and numbness in your feet, which can make you fall.   . Low blood sugar can cause confusion and dizziness. .   Other coping strategies 1. Be aware of weather conditions at all times and modify your activities accordingly 2.  Always wear sturdy shoes with good soles that will not slip 3.  Consider removing the loose rugs in your home and clearing out the pathways in your home so there are no fall hazards to trip you up 4. 5.   PRACTICE It is important to practice the strategies so we can determine if they will be effective in helping to reach the goal.    Follow these specific recommendations:        If strategy does not work the first time, try it again.     We may make some changes over the next few sessions.      Oneta Rack, RN, BSN, [employer] [facility]  [phone]

## 2019-12-08 NOTE — Patient Outreach (Signed)
Aging Psychiatrist Visit # 2- by phone  12/08/2019  [name] 1950-02-05 [id number]  Visit:   Aging Gracefully RN Visit # 2  Start Time:   10:25 am End Time:   10:40 am Total Minutes:   15 minutes  Universal RN Interventions: Calendar Distribution: Yes (Reviewed with client today) Exercise Review: Yes (Reviewed with client today) Medications: Yes (Reviewed with client today) Medication Changes: No (Reviewed with client today) Pain: Yes (Reviewed with client today) Fall Prevention: Yes (Reviewed with client today) Incontinence: Yes (Reviewed with client today)  Healthcare Provider Communication: Did Higher education careers adviser With [place] Provider?: No  Clinician View of Client Situation: Clinician View Of Client Situation: Eriko seems to be making good progress in meeting her previously established Aging Gracefully goals around fall prevention and reports not having any new or recent falls; she continues using her cane and walker and has started occasonally doing some of the Aging Gracefully exercises.  She continues practicing not rushing and taking her time and reports that the newly installed ramp at her home is helping greatly in making getting out easier on her.   Client's View of His/Her Situation: Client View Of His/Her Situation: "I know I have to accept that I have to slow down and take my time and not rush, that is the biggest thing I have learned; I have not had any recent falls and keep using my cane and my walker.  I have been less dizzy lately, and I am glad about that; none of my medicines have changed, I am just taking my time when I have to get up and I have to accept that even though I don't like it, I have to wear the depends, because my doctors have already told me that I can't take the medicine or have any procedure to stop my urine leaking; so I guess that is about the same as when you visited me.  I have started doing some of the exercises you showed  me, but I am not doing them every day.  They seem to help a little bit.  The ramp the [facility] team put off my door is really making a big difference and helping me get in and out.  They have not started any other repairs or home modifications yet."  Medication Assessment: Confirmed client reports no recent changes to medications   OT Update: Will update Aging Gracefully team around successful completion of RN Visit # 2 by phone today  Session Summary: Pleasant/ brief RN Visit # 2 by phone; client reports she is out running errands and does not have much time to talk with me today; she reports that she has continued practicing not rushing and taking her time, but has not consistently initiated any of the strategies we previously discussed at RN Visit # 1; she was encouraged to begin taking steps to perform the Aging Gracefully exercises more consistently, to make environmental changes to her home to prevent falls and to continue consistently using her cane and walker, as she reports doing as baseline.  Client expresses appreciation for newly installed ramp at her home and reports that it is helping her feel more safe when she goes out.  She does not have any new strategies to share with attempts at brainstorming today, and was encouraged to continue thinking of ways to prevent falls.  I made patient aware that Aging Gracefully nurse Estill Bamberg would be contacting her in the future to schedule Aging Gracefully RN  Visit # 3.  Oneta Rack, RN, BSN, [employer] [facility] Care Management  [phone]

## 2019-12-09 ENCOUNTER — Other Ambulatory Visit: Payer: Self-pay

## 2019-12-09 ENCOUNTER — Other Ambulatory Visit: Payer: Self-pay | Admitting: Occupational Therapy

## 2019-12-09 ENCOUNTER — Other Ambulatory Visit: Payer: Medicare HMO

## 2019-12-09 DIAGNOSIS — I5042 Chronic combined systolic (congestive) and diastolic (congestive) heart failure: Secondary | ICD-10-CM

## 2019-12-09 DIAGNOSIS — I2510 Atherosclerotic heart disease of native coronary artery without angina pectoris: Secondary | ICD-10-CM

## 2019-12-09 LAB — BASIC METABOLIC PANEL
BUN/Creatinine Ratio: 19 (ref 12–28)
BUN: 20 mg/dL (ref 8–27)
CO2: 24 mmol/L (ref 20–29)
Calcium: 9.4 mg/dL (ref 8.7–10.3)
Chloride: 103 mmol/L (ref 96–106)
Creatinine, Ser: 1.03 mg/dL — ABNORMAL HIGH (ref 0.57–1.00)
GFR calc Af Amer: 64 mL/min/{1.73_m2} (ref >59)
GFR calc non Af Amer: 56 mL/min/{1.73_m2} — ABNORMAL LOW (ref >59)
Glucose: 95 mg/dL (ref 65–99)
Potassium: 4.2 mmol/L (ref 3.5–5.2)
Sodium: 140 mmol/L (ref 134–144)

## 2019-12-09 NOTE — Patient Instructions (Signed)
Would like to be more independent with her basic ADLs (reacher, sock aid, long sponge, and ted hose donner will help with this, as well as possibly walk in shower stall v. Tub bench) IN PROGRESS  ACTION PLANNING - DRESSING/BATHING Target Problem Area: Difficulty with bathing and dressing  Why Problem May Occur: Difficulty getting to her lower body      Target Goal: Independence with lower body bathing and dressing  STRATEGIES Saving Your Energy: DO: DON'T:  Sit down to do tasks - can sit on toilet Stand too long while dressing  Use appropriate adaptive equipment:  long handled sponge, sock aid, reacher, ted hose donner   Keep all items you'll need within easy reach    Simplifying the way you set up tasks or daily routines: DO: DON'T:  Wear sturdy shoes that grip the floor   Gather all items before getting started   Wear clothing that is easily manipulated Wear loose, flowing clothes   PRACTICE It is important to practice the strategies so we can determine if they will be effective in helping to reach your goal. Follow these specific recommendations: 1.Try and get all lower body clothing on/started with adaptive equipment before standing up  (this save energy)  2. Always sit to do/start lower body dressing and to bath lower legs and feet  If a strategy does not work the first time, try it again and again (and maybe again). We may make some changes over the next few sessions, based on how they work.  Golden Circle, OTR/L        12/09/2019

## 2019-12-09 NOTE — Patient Outreach (Signed)
Aging Gracefully Program  OT Follow-Up Visit  12/09/2019  [name] [birth year] [id number]  Visit:  2- Second Visit  Start Time:  4098 End Time:  1191 Total Minutes:  50   Readiness to Change Score :  Readiness to Change Score: 9  Durable Medical Equipment: Adaptive Equipment: Long Handled Sponge, [place], Sock Aid (tried ted hose donner--too big) Adaptive Equipment Distribution Date: 12/09/19  Patient Education: Education Provided: Yes Education Details: We discussed the handout I had left with her last time--"Safety Checklist". While I was there I assisted her with taping down a couple of rugs on top of her carpet that were a trip hazzard and moving her cell phone cord closer to her to another outlet so that the cord was stretched across her walking path. Person(s) Educated: Patient Comprehension: Verbalized Understanding  Goals:  Goals Addressed            This Visit's Progress   . Patient Stated   On track    Would like to be more independent with her basic ADLs (reacher, sock aid, long sponge, and ted hose donner will help with this, as well as possibly walk in shower stall v. Tub bench) IN PROGRESS  ACTION PLANNING - DRESSING/BATHING Target Problem Area: Difficulty with bathing and dressing  Why Problem May Occur: Difficulty getting to her lower body      Target Goal: Independence with lower body bathing and dressing  STRATEGIES Saving Your Energy: DO: DON'T:  Sit down to do tasks - can sit on toilet Stand too long while dressing  Use appropriate adaptive equipment:  long handled sponge, sock aid, reacher, ted hose donner   Keep all items you'll need within easy reach    Simplifying the way you set up tasks or daily routines: DO: DON'T:  Wear sturdy shoes that grip the floor   Gather all items before getting started   Wear clothing that is easily manipulated Wear loose, flowing clothes   PRACTICE It is important to practice the strategies so we  can determine if they will be effective in helping to reach your goal. Follow these specific recommendations: 1.Try and get all lower body clothing on/started with adaptive equipment before standing up  (this save energy)  2. Always sit to do/start lower body dressing and to bath lower legs and feet  If a strategy does not work the first time, try it again and again (and maybe again). We may make some changes over the next few sessions, based on how they work.  Golden Circle, OTR/L        12/09/2019       Post Clinical Reasoning: Client Action (Goal) Four Interventions: Ms. Jewell was able to use the sock aid to put on her socks with ease, the reacher take her socks off easier, we also discussed using the reacher to put on underwear and pants, she simulated the use of the long handled sponge. The ted hose donner was too big--needs a smaller one, Did Client Try?: Yes Targeted Problem Area Status: A Lot Better Clinician View Of Client Situation:: Ms. Niehaus was happy to give the adaptive equipment a try and was pleasantly surprised with how well the sock aid worked. She said she looked forward to trying the long handeled sponge. The ted sock donner did not work as well as hoped, it was too large. Client View Of His/Her Situation:: Ms. Scahill reports she is much better than hte last time I saw her. Her back is  not hurting her now. However she does have a couple of places on her left lower leg that are now draining a little bit--she reports she has an appointment at the wound center a week from this Friday. She is really happy with the ramp/deck that has been built at her front entrance that allows her to safely get ouf of her house. She is looking foreward to getting a rail at her back steps and a walk in shower in her bathroom. Next Visit Plan:: Shower safety once her walk in shower is complete.

## 2020-01-06 ENCOUNTER — Other Ambulatory Visit: Payer: Self-pay

## 2020-01-06 ENCOUNTER — Encounter: Payer: Self-pay | Admitting: *Deleted

## 2020-01-06 NOTE — Patient Outreach (Signed)
Aging Gracefully Program  01/06/2020  Nyema Hachey [birth year] [id number]    Aging Gracefully:  Placed call to patient to schedule appointment for home visit.  PLAN: appointment on 01/11/2020 at 1pm.  Rowe Pavy, RN, BSN, CEN [facility] Coordinator [phone]

## 2020-01-11 ENCOUNTER — Ambulatory Visit (INDEPENDENT_AMBULATORY_CARE_PROVIDER_SITE_OTHER): Payer: Medicare HMO

## 2020-01-11 ENCOUNTER — Other Ambulatory Visit: Payer: Self-pay

## 2020-01-11 DIAGNOSIS — I5042 Chronic combined systolic (congestive) and diastolic (congestive) heart failure: Secondary | ICD-10-CM

## 2020-01-11 DIAGNOSIS — Z95810 Presence of automatic (implantable) cardiac defibrillator: Secondary | ICD-10-CM

## 2020-01-11 NOTE — Patient Outreach (Signed)
Aging Gracefully Program  01/11/2020  [name] 09-03-1950 [id number]  Care Coordination:  Placed call to patient to reschedule home visit due to snow.  Rescheduled for 01/14/2020 at 12:30pm  Rowe Pavy, RN, BSN, CEN [facility] [employer] [phone]

## 2020-01-13 ENCOUNTER — Ambulatory Visit (INDEPENDENT_AMBULATORY_CARE_PROVIDER_SITE_OTHER): Payer: Medicare HMO | Admitting: Cardiology

## 2020-01-13 ENCOUNTER — Other Ambulatory Visit: Payer: Self-pay

## 2020-01-13 ENCOUNTER — Encounter: Payer: Self-pay | Admitting: Cardiology

## 2020-01-13 VITALS — BP 128/70 | HR 72 | Ht 63.0 in | Wt 230.0 lb

## 2020-01-13 DIAGNOSIS — I472 Ventricular tachycardia, unspecified: Secondary | ICD-10-CM

## 2020-01-13 DIAGNOSIS — Z95810 Presence of automatic (implantable) cardiac defibrillator: Secondary | ICD-10-CM

## 2020-01-13 DIAGNOSIS — I5042 Chronic combined systolic (congestive) and diastolic (congestive) heart failure: Secondary | ICD-10-CM

## 2020-01-13 DIAGNOSIS — I2510 Atherosclerotic heart disease of native coronary artery without angina pectoris: Secondary | ICD-10-CM | POA: Diagnosis not present

## 2020-01-13 DIAGNOSIS — I700 Atherosclerosis of aorta: Secondary | ICD-10-CM | POA: Insufficient documentation

## 2020-01-13 DIAGNOSIS — I10 Essential (primary) hypertension: Secondary | ICD-10-CM

## 2020-01-13 DIAGNOSIS — E119 Type 2 diabetes mellitus without complications: Secondary | ICD-10-CM

## 2020-01-13 MED ORDER — FUROSEMIDE 40 MG PO TABS: 40.0000 mg | ORAL_TABLET | Freq: Every day | ORAL | 1 refills | Status: DC

## 2020-01-13 NOTE — Patient Instructions (Signed)
Medication Instructions:   START TAKING FUROSEMIDE (LASIX) 40 MG BY MOUTH DAILY  *If you need a refill on your cardiac medications before your next appointment, please call your pharmacy*   Lab Work:  IN ONE MONTH HERE IN THE OFFICE TO CHECK--BMET  If you have labs (blood work) drawn today and your tests are completely normal, you will receive your results only by: Marland Kitchen MyChart Message (if you have MyChart) OR . A paper copy in the mail If you have any lab test that is abnormal or we need to change your treatment, we will call you to review the results.   Follow-Up:  3 MONTHS IN THE OFFICE WITH DR. Anne Fu

## 2020-01-13 NOTE — Progress Notes (Signed)
Cardiology Office Note:    Date:  01/13/2020   ID:  [name], DOB 08-15-1950, MRN [id number]  PCP:  Marva Panda, NP  [facility] Cardiologist:  Donato Schultz, MD  [facility] Electrophysiologist:  Hillis Range, MD   Referring MD: Marva Panda, NP    History of Present Illness:    [name] is a 70 y.o. female here for the follow-up of chronic systolic heart failure ischemic cardiomyopathy CABG 2007 obstructive sleep apnea diabetes hypertension post ICD CRT (CRT portion unsuccessful due to vein sclerosis)  Had been on Xarelto for anticoagulation given bilateral lower extremity DVT  2 pillows at night. No change from prior. No CP. Has noted 8 pound weight gain and more LE edema (chonic).    Past Medical History:  Diagnosis Date  . Abnormal LFTs   . AICD (automatic cardioverter/defibrillator) present   . Angina decubitus (HCC) 11/06/2012  . Angina pectoris (HCC)   . Anxiety   . Arthritis   . Back pain   . Bundle branch block 05/2016  . CAD (coronary artery disease) 06/18/2010  . CHF (congestive heart failure) (HCC)   . Chronic combined systolic and diastolic CHF, NYHA class 3 (HCC)   . Chronic combined systolic and diastolic heart failure (HCC)   . Chronic systolic dysfunction of left ventricle 09/24/2012  . Coronary artery disease    s/p CABG 2007  . Depression   . Dermal mycosis   . Diabetes mellitus    type II  . DKA (diabetic ketoacidoses) 01/12/2019  . DVT (deep venous thrombosis) (HCC) 09/2015   bilateral  . Edema, lower extremity   . Glaucoma   . Hyperlipidemia   . Hypertension   . Hypoglycemia 09/09/2012  . Insomnia   . Insulin dependent diabetes mellitus (HCC) 04/23/2012  . Ischemic cardiomyopathy    EF 30-35%, s/p ICD 4/08 by Dr Amil Amen  . Joint pain   . LBBB (left bundle branch block) 09/24/2012  . Morbid obesity (HCC) 11/06/2012  . Obesity   . Oxygen dependent 2 L  . Pain in left knee   . Peripheral neuropathy   . S/P CABG x  5 10/05/2005   LIMA to D2, SVG to ramus intermediate, sequential SVG to OM1-OM2, SVG to RCA with EVH via both legs   . Sleep apnea    uses O2 at night  . Tinea   . Type 2 diabetes mellitus with hyperglycemia, with long-term current use of insulin (HCC) 04/20/2019  . Uncontrolled diabetes mellitus (HCC) 02/2019  . Ventricular tachycardia (HCC) 01/16/15   sustained VT terminated with ATP, CL 340 msec  . Vitamin B 12 deficiency 11/05/2018  . Vitamin D deficiency 11/05/2018    Past Surgical History:  Procedure Laterality Date  . BI-VENTRICULAR IMPLANTABLE CARDIOVERTER DEFIBRILLATOR UPGRADE N/A 09/25/2012   Procedure: BI-VENTRICULAR IMPLANTABLE CARDIOVERTER DEFIBRILLATOR UPGRADE;  Surgeon: Gardiner Rhyme, MD;  Location: [facility] CATH LAB;  Service: Cardiovascular;  Laterality: N/A;  . BREAST BIOPSY Right 04/05/2014  . CARDIAC CATHETERIZATION    . CARDIAC DEFIBRILLATOR PLACEMENT  4/08   by Dr Amil Amen (MDT)  . COLONOSCOPY WITH PROPOFOL N/A 10/12/2016   Procedure: COLONOSCOPY WITH PROPOFOL;  Surgeon: Sherrilyn Rist, MD;  Location: [facility] ENDOSCOPY;  Service: Gastroenterology;  Laterality: N/A;  . CORONARY ARTERY BYPASS GRAFT  10/05/2005   by Dr Laneta Simmers  . IMPLANTABLE CARDIOVERTER DEFIBRILLATOR IMPLANT  09/25/12   attempt of upgrade to CRT-D unsuccessful due to CS anatomy, SJM Unify Asaura device placed with  LV port capped by Dr Rayann Heman  . IR GENERIC HISTORICAL  10/03/2015   IR US GUIDE VASC ACCESS LEFT 10/03/2015 Jacqulynn Cadet, MD [facility]  . IR GENERIC HISTORICAL  10/03/2015   IR VENO/EXT/UNI LEFT 10/03/2015 Jacqulynn Cadet, MD [facility]  . IR GENERIC HISTORICAL  10/03/2015   IR VENOCAVAGRAM IVC 10/03/2015 Jacqulynn Cadet, MD [facility]  . IR GENERIC HISTORICAL  10/03/2015   IR INFUSION THROMBOL VENOUS INITIAL (MS) 10/03/2015 Jacqulynn Cadet, MD [facility]  . IR GENERIC HISTORICAL  10/03/2015   IR US GUIDE VASC ACCESS LEFT 10/03/2015 Jacqulynn Cadet, MD [facility]  . IR GENERIC  HISTORICAL  10/04/2015   IR THROMBECT VENO MECH MOD SED 10/04/2015 Sandi Mariscal, MD [facility]  . IR GENERIC HISTORICAL  10/04/2015   IR TRANSCATH PLC STENT 1ST ART NOT LE CV CAR VERT CAR 10/04/2015 Sandi Mariscal, MD [facility]  . IR GENERIC HISTORICAL  10/04/2015   IR THROMB F/U EVAL ART/VEN FINAL DAY (MS) 10/04/2015 Sandi Mariscal, MD [facility]  . IR GENERIC HISTORICAL  11/02/2015   IR RADIOLOGIST EVAL & MGMT 11/02/2015 Sandi Mariscal, MD [facility]  . IR RADIOLOGIST EVAL & MGMT  04/26/2016  . LEFT HEART CATH AND CORS/GRAFTS ANGIOGRAPHY N/A 01/19/2019   Procedure: LEFT HEART CATH AND CORS/GRAFTS ANGIOGRAPHY;  Surgeon: Belva Crome, MD;  Location: [place] CV LAB;  Service: Cardiovascular;  Laterality: N/A;    Current Medications: Current Meds  Medication Sig  . albuterol (PROVENTIL HFA;VENTOLIN HFA) 108 (90 BASE) MCG/ACT inhaler Inhale 2 puffs into the lungs every 6 (six) hours as needed for wheezing or shortness of breath.  Marland Kitchen albuterol (PROVENTIL) (2.5 MG/3ML) 0.083% nebulizer solution Take 2.5 mg by nebulization every 6 (six) hours as needed for wheezing or shortness of breath.   Marland Kitchen amitriptyline (ELAVIL) 10 MG tablet Take 10 mg by mouth daily.  . Capsaicin (MUSCLE RELIEF EX) Apply 1 application topically daily. For pain  . carvedilol (COREG) 6.25 MG tablet Take 1 tablet (6.25 mg total) by mouth 2 (two) times daily with a meal.  . cyclobenzaprine (FLEXERIL) 5 MG tablet Take 5 mg by mouth 3 (three) times daily as needed for muscle spasms.  . dapagliflozin propanediol (FARXIGA) 5 MG TABS tablet Take 1 tablet (5 mg total) by mouth daily before breakfast.  . ferrous sulfate 325 (65 FE) MG EC tablet Take 325 mg by mouth daily.   . fluconazole (DIFLUCAN) 150 MG tablet Take 150 mg by mouth every 7 (seven) days.  . furosemide (LASIX) 40 MG tablet Take 1 tablet (40 mg total) by mouth daily.  . insulin degludec (TRESIBA FLEXTOUCH) 100 UNIT/ML FlexTouch Pen Inject 0.22 mLs (22 Units total) into  the skin daily.  . Insulin Pen Needle 32G X 4 MM MISC 1 Device by Does not apply route in the morning, at noon, in the evening, and at bedtime.  . isosorbide dinitrate (ISORDIL) 30 MG tablet Take 30 mg by mouth every morning.  . latanoprost (XALATAN) 0.005 % ophthalmic solution Place 1 drop into both eyes at bedtime.  Marland Kitchen lisinopril (ZESTRIL) 2.5 MG tablet Take 1 tablet (2.5 mg total) by mouth daily.  . meloxicam (MOBIC) 15 MG tablet Take 15 mg by mouth daily.  . methocarbamol (ROBAXIN) 500 MG tablet Take 1 tablet (500 mg total) by mouth 2 (two) times daily as needed for muscle spasms.  . nitroGLYCERIN (NITROSTAT) 0.4 MG SL tablet Place 0.4 mg under the tongue every 5 (five) minutes as  needed for chest pain.  Marland Kitchen. NOVOLOG FLEXPEN 100 UNIT/ML FlexPen Inject 6 Units into the skin 3 (three) times daily with meals.  . rosuvastatin (CRESTOR) 20 MG tablet Take 1 tablet (20 mg total) by mouth daily at 6 PM.  . Semaglutide, 1 MG/DOSE, (OZEMPIC, 1 MG/DOSE,) 2 MG/1.5ML SOPN Inject 0.75 mLs (1 mg total) into the skin once a week.  . traMADol (ULTRAM) 50 MG tablet Take 50 mg by mouth 2 (two) times daily.  . Vitamin D, Ergocalciferol, (DRISDOL) 1.25 MG (50000 UT) CAPS capsule Take 1 capsule (50,000 Units total) by mouth every 7 (seven) days.  Carlena Hurl. XARELTO 20 MG TABS tablet Take 20 mg by mouth daily.      Allergies:   Lipitor  [atorvastatin calcium], Metformin and related, Atorvastatin, and Levofloxacin   Social History   Socioeconomic History  . Marital status: Legally Separated    Spouse name: Not on file  . Number of children: 3  . Years of education: 6217  . Highest education level: Not on file  Occupational History  . Occupation: retired    Associate Professormployer: UNEMPLOYED  Tobacco Use  . Smoking status: Never Smoker  . Smokeless tobacco: Never Used  Vaping Use  . Vaping Use: Never used  Substance and Sexual Activity  . Alcohol use: No  . Drug use: No  . Sexual activity: Not Currently  Other Topics Concern   . Not on file  Social History Narrative   Disabled Producer, television/film/videohair dresser.  Currently taking sociology classes at [employer].   11/04/15 Lives with son    caffeine - coffee, 1-2 cups daily   Social Determinants of Health   Financial Resource Strain: Not on file  Food Insecurity: Not on file  Transportation Needs: Not on file  Physical Activity: Not on file  Stress: Not on file  Social Connections: Not on file     Family History: The patient's family history includes Diabetes (age of onset: 2265) in her mother; High blood pressure in her mother; Obesity in her mother; Other in an other family member; Stroke in her mother; Sudden death in her mother.  ROS:   Please see the history of present illness.     All other systems reviewed and are negative.  EKGs/Labs/Other Studies Reviewed:    The following studies were reviewed today:   Cardiac catheterization 01/19/2019: catheterization on 01/19/2019:  Severe diffuse three-vessel coronary disease with functional total occlusion of the mid LAD, total occlusion of the large first diagonal, total occlusion of the proximal ramus intermedius, total occlusion of the proximal circumflex, and total occlusion of the mid RCA. All vessels are heavily calcified.  Patent saphenous vein graft to the PDA. Moderate diffuse disease beyond the graft insertion site  Patent sequential saphenous vein graft to the first and second obtuse marginal. Diffuse native vessel disease beyond the bypass graft insertion site.  Patent saphenous vein graft to the ramus intermedius. Diffuse native vessel disease beyond the graft insertion site.  Patent left internal mammary graft to the dominant diagonal. Diffuse native vessel disease beyond the graft insertion site.  Chronic systolic heart failure with EF less than 35% and LVEDP 22 mmHg.  RECOMMENDATIONS:   Guideline directed therapy for systolic heart failure.   Diagnostic Dominance: Right      Last echo 01/2019  challenging images-EF perhaps 50%.  Cardiac catheterization around that time however EF estimated 35%.   Recent Labs: 01/19/2019: B Natriuretic Peptide 533.7; Magnesium 2.3 02/16/2019: TSH 1.380 02/19/2019: ALT 42 02/22/2019: Hemoglobin  9.9; Platelets 169 12/09/2019: BUN 20; Creatinine, Ser 1.03; Potassium 4.2; Sodium 140  Recent Lipid Panel    Component Value Date/Time   CHOL 111 11/03/2018 1205   CHOL 90 12/22/2012 1042   TRIG 87 11/03/2018 1205   TRIG 85 12/22/2012 1042   HDL 30 (L) 11/03/2018 1205   HDL 33 (L) 12/22/2012 1042   CHOLHDL 3.6 08/05/2017 1048   CHOLHDL 4.2 04/25/2012 0520   VLDL 23 04/25/2012 0520   LDLCALC 64 11/03/2018 1205   LDLCALC 40 12/22/2012 1042      Physical Exam:    VS:  BP 128/70   Pulse 72   Ht 5\' 3"  (1.6 m)   Wt 230 lb (104.3 kg)   SpO2 98%   BMI 40.74 kg/m     Wt Readings from Last 3 Encounters:  01/13/20 230 lb (104.3 kg)  11/19/19 223 lb 8 oz (101.4 kg)  11/11/19 220 lb (99.8 kg)     GEN:  Well nourished, well developed in no acute distress HEENT: Normal NECK: No JVD; No carotid bruits LYMPHATICS: No lymphadenopathy CARDIAC: RRR, no murmurs, rubs, gallops RESPIRATORY:  Clear to auscultation without rales, wheezing or rhonchi  ABDOMEN: Soft, non-tender, non-distended MUSCULOSKELETAL:  No edema; No deformity  SKIN: Warm and dry NEUROLOGIC:  Alert and oriented x 3 PSYCHIATRIC:  Normal affect   ASSESSMENT:    1. Chronic combined systolic and diastolic heart failure ([place])   2. Coronary artery disease involving native coronary artery of native heart without angina pectoris   3. ICD (implantable cardioverter-defibrillator) in place   4. Ventricular tachycardia ([place])   5. AICD (automatic cardioverter/defibrillator) present   6. Essential hypertension   7. Diabetes mellitus with coincident hypertension ([place])   8. Aortic atherosclerosis (HCC)    PLAN:    In order of problems listed above:  Chronic systolic heart failure secondary  to ischemic cardiomyopathy -ICD in place, in November [place] thoracic impedance was normal -Continue with goal-directed medical therapy. -Prior hospitalization with DKA proved challenging due to hypotension and acute kidney injury. -Last directive 12/02/2019 after [name] saw, creatinine had increased, potassium was normal.  Plan was to stop spironolactone hold lisinopril for 3 days then resume and basic metabolic profile in 1 week. -I will go ahead and resume her Lasix 40 mg once a day.  We will check a basic metabolic profile again in 1 month.  She is up about 8 pounds.  Lower extremity edema noted.  Fairly chronic however.  If kidney function/creatinine remains reasonable, I will likely continue with daily Lasix.   ICD -Had 1 discharge/shock in the setting of polymorphic VT when she was admitted for gallstone pancreatitis 1/21  Prior DVT -On Xarelto chronically for prevention.  No evidence of bleeding.  Continue to monitor hemoglobin closely. -Had prior thrombolysis with intravascular TPA stenting to left common iliac vein and external iliac vein -No aspirin secondary to Xarelto  Diabetes with hypertension -Has been difficult to control, on insulin.  Followed closely by PCP.  Morbid obesity -Continue to encourage weight loss, decrease carbohydrates.  Aortic atherosclerosis -Not on aspirin secondary to Xarelto use to reduce bleeding. -Continue with Crestor 20.        Medication Adjustments/Labs and Tests Ordered: Current medicines are reviewed at length with the patient today.  Concerns regarding medicines are outlined above.  Orders Placed This Encounter  Procedures  . Basic metabolic panel   Meds ordered this encounter  Medications  . furosemide (LASIX) 40 MG tablet  Sig: Take 1 tablet (40 mg total) by mouth daily.    Dispense:  90 tablet    Refill:  1    Patient Instructions  Medication Instructions:   START TAKING FUROSEMIDE (LASIX) 40 MG BY MOUTH  DAILY  *If you need a refill on your cardiac medications before your next appointment, please call your pharmacy*   Lab Work:  IN ONE MONTH HERE IN THE OFFICE TO CHECK--BMET  If you have labs (blood work) drawn today and your tests are completely normal, you will receive your results only by: Marland Kitchen MyChart Message (if you have MyChart) OR . A paper copy in the mail If you have any lab test that is abnormal or we need to change your treatment, we will call you to review the results.   Follow-Up:  3 MONTHS IN THE OFFICE WITH DR. Marlou Porch       Signed, Candee Furbish, MD  01/13/2020 3:10 PM    [facility]

## 2020-01-14 ENCOUNTER — Other Ambulatory Visit: Payer: Self-pay

## 2020-01-14 NOTE — Patient Outreach (Signed)
Aging Gracefully Program  RN Visit  01/14/2020  [name] [birth year] [id number]  Visit:   RN home visit #3  Start Time:   1130 End Time:   1215 Total Minutes:   45  Readiness To Change Score:     Universal RN Interventions: Calendar Distribution: Yes Exercise Review: Yes Medications: Yes Medication Changes: Yes Mood: Yes Pain: Yes PCP Advocacy/Support: No Fall Prevention: Yes Incontinence: No Clinician View Of Client Situation: Arrived to home for scheuled home visti. Patient yelled for me to come in . Gismo ( the dog) was barking.  Entered the home and screening completed for COVID and all answers were no.  Patient sitting on couch covered up in a blanket. Carpet noted to be wrinkled and a tripping hazard. Client View Of His/Her Situation: Patient reports to me that she is doing well. Denies any recent falls. Reports ramp is helping her enter and exit her home. Reports she has had some new medications for her back pain.  Med list update. Reports meds are helping but states pain is 10/10.  Reports she is using her walker and cane when ambulating. Reviewed concerns for carpet being buckled.  Healthcare Provider Communication:none    Clinician View of Client Situation: Diplomatic Services operational officer Of Client Situation: Arrived to home for scheuled home visti. Patient yelled for me to come in . Gismo ( the dog) was barking.  Entered the home and screening completed for COVID and all answers were no.  Patient sitting on couch covered up in a blanket. Carpet noted to be wrinkled and a tripping hazard. Client's View of His/Her Situation: Client View Of His/Her Situation: Patient reports to me that she is doing well. Denies any recent falls. Reports ramp is helping her enter and exit her home. Reports she has had some new medications for her back pain.  Med list update. Reports meds are helping but states pain is 10/10.  Reports she is using her walker and cane when ambulating. Reviewed concerns  for carpet being buckled.  Medication Assessment:recent was added mobic, flexeril and baclofen with relief.     OT Update: pending completion of modifications  Session Summary: doing well. No falls. Continues to work on exercise plan. Continues to recognize risk for falls.   Goals Addressed            This Visit's Progress   . Aging Gracefully RN: Prevent falls       CLIENT/RN ACTION PLAN - FALL PREVENTION  Registered Nurse:  Ricke Hey, RN Date: 12/08/19  [name] Client ID:  [id number]   Target Area:  FALL PREVENTION    Why Problem May Occur: -- previous history of falls -- limited mobility -- chronic knee and back pain -- some medications that you currently take -- environmental variables in your home: loose rugs with wrinkles/ creases, etc  Target Goal: to prevent falls in the future   STRATEGIES Coping Strategies Ideas  Change Positions Slowly: lying to sitting, sitting to standing MOVE SLOWLY DO NOT RUSH- for anything USE CANE/ WALKER . Changing positions can make people lightheaded. . When getting up in the morning sit for a few minutes, before standing.  . Stand for a few minutes before walking and hold on to sturdy furniture or countertop.  .   Drink water- don't allow yourself to become dehydrated . Dehydration can make people dizzy. . Ask your healthcare provider how much water you can drink. . Decrease caffeine, caffeine makes you urinate  a lot, which can make you dehydrated.   If you fall, tell someone, and tell them what caused you to fall:  -- write down any falls that occur on your calendar: sometimes internal injuries may take awhile to show symptoms, so you don't want to forget the details of the fall . Tell a friend or family member even if you didn't get hurt. . Tell your healthcare provider if you fall.  They can help you figure out why.   Get your vision and hearing checked -- keep up the good work going to regular eye exams  . Poor vision and hearing loss can make people fall. . Glaucoma and diabetes can cause poor vision. .   Other .    Prevention Ideas  Review your Medicines -- NTG can cause your blood pressure to drop: always sit down if you need to use this medication -- Insulin can cause a quick drop in your blood sugar which could lead to falls-- this is especially true with your meal time insulin -- pain medication can make you feel sleepy  . Many medicines can make you dizzy or sleepy and increase your risk of falling. . Your Aging Gracefully Nurse will look at your medications and see if you are taking any that might cause falling.   Activity and Exercise -- stay as active as possible, make time for regular exercises and stretching; try to incorporate into your regular routine -- start gradually and build up over time -- do not over-do on exercise . Aging Gracefully exercises- do the ones that you are able to do while sitting; if you try the standing exercises, make sure your family is with you by your side . Walking (inside or outside) . Dancing . Gardening . Housework:  cooking, Education officer, environmental, Pharmacologist . Exercise while watching TV . Swimming or water aerobics. .   Strengthen Bones- continue sitting in sun regularly -- ask your doctor if you should add extra Calcium to the the Vitamin D you are already taking- calcium helps the vitamin D be absorbed better . Exercise makes your bones stronger. . Vitamin D and Calcium make your bones stronger.  Ask your Healthcare provider if you should be taking them. . Your body makes vitamin D from the sun.  Sit in the sun for 10 minutes every day (without sunscreen). .   Control your urine  -- try the Kegel exercises in the list of exercises that was provided -- talk to your doctor about possible treatments for urinary incontinence/ bladder control . Prevent constipation . Cut back on caffeinated drinks. . Don't wait to urinate. . If diabetic, control your blood  sugar. . Ask your Aging Gracefully Nurse and Healthcare Provider  about urine control. .   Control your blood sugar (if you are diabetic) -- be very careful if you have drops in your blood sugar-- if this starts happening more frequently be sure to tell your doctor . High blood sugar can cause frequent urination, poor vision, and numbness in your feet, which can make you fall.   . Low blood sugar can cause confusion and dizziness. .   Other coping strategies 1. Be aware of weather conditions at all times and modify your activities accordingly 2.  Always wear sturdy shoes with good soles that will not slip 3.  Consider removing the loose rugs in your home and clearing out the pathways in your home so there are no fall hazards to trip you up 4. 5.  PRACTICE It is important to practice the strategies so we can determine if they will be effective in helping to reach the goal.    Follow these specific recommendations:        If strategy does not work the first time, try it again.     We may make some changes over the next few sessions.      Caryl Pina, RN, BSN, [employer] [facility] Care Management  [phone]    01/14/2020  Assessment:  denies any new falls. Reports doing home exercises 3 times per week.  Reports pain 10/10. On new medications to help with lower back pain. Reports new meds are helping.  Intervention:  reviewed fall precautions, reviewed importance of getting wrinkles out of carpet. Reviewed tripping hazards of throw rungs and animals in the home.   Encouraged patient to continue to do home exercises.   Rowe Pavy, RN, BSN, CEN [facility] [phone]        Next and final home visit planned for 02/04/2020 at 11:30.  Provided my contact phone number as patients new Aging Gracefully case Production designer, theatre/television/film.  Rowe Pavy, RN, BSN, CEN [facility] [employer] [facility] [phone]

## 2020-01-15 ENCOUNTER — Encounter (HOSPITAL_BASED_OUTPATIENT_CLINIC_OR_DEPARTMENT_OTHER): Payer: Medicare HMO | Attending: Internal Medicine | Admitting: Internal Medicine

## 2020-01-15 ENCOUNTER — Other Ambulatory Visit: Payer: Self-pay

## 2020-01-15 DIAGNOSIS — Z881 Allergy status to other antibiotic agents status: Secondary | ICD-10-CM | POA: Diagnosis not present

## 2020-01-15 DIAGNOSIS — E1136 Type 2 diabetes mellitus with diabetic cataract: Secondary | ICD-10-CM | POA: Insufficient documentation

## 2020-01-15 DIAGNOSIS — Z833 Family history of diabetes mellitus: Secondary | ICD-10-CM | POA: Diagnosis not present

## 2020-01-15 DIAGNOSIS — Z86718 Personal history of other venous thrombosis and embolism: Secondary | ICD-10-CM | POA: Diagnosis not present

## 2020-01-15 DIAGNOSIS — I5022 Chronic systolic (congestive) heart failure: Secondary | ICD-10-CM | POA: Insufficient documentation

## 2020-01-15 DIAGNOSIS — E1122 Type 2 diabetes mellitus with diabetic chronic kidney disease: Secondary | ICD-10-CM | POA: Insufficient documentation

## 2020-01-15 DIAGNOSIS — I890 Lymphedema, not elsewhere classified: Secondary | ICD-10-CM | POA: Insufficient documentation

## 2020-01-15 DIAGNOSIS — I87323 Chronic venous hypertension (idiopathic) with inflammation of bilateral lower extremity: Secondary | ICD-10-CM | POA: Insufficient documentation

## 2020-01-15 DIAGNOSIS — Z95828 Presence of other vascular implants and grafts: Secondary | ICD-10-CM | POA: Diagnosis not present

## 2020-01-15 DIAGNOSIS — E1151 Type 2 diabetes mellitus with diabetic peripheral angiopathy without gangrene: Secondary | ICD-10-CM | POA: Diagnosis not present

## 2020-01-15 DIAGNOSIS — Z951 Presence of aortocoronary bypass graft: Secondary | ICD-10-CM | POA: Diagnosis not present

## 2020-01-15 DIAGNOSIS — N189 Chronic kidney disease, unspecified: Secondary | ICD-10-CM | POA: Insufficient documentation

## 2020-01-15 DIAGNOSIS — Z9981 Dependence on supplemental oxygen: Secondary | ICD-10-CM | POA: Insufficient documentation

## 2020-01-15 DIAGNOSIS — I2510 Atherosclerotic heart disease of native coronary artery without angina pectoris: Secondary | ICD-10-CM | POA: Diagnosis not present

## 2020-01-15 DIAGNOSIS — E1110 Type 2 diabetes mellitus with ketoacidosis without coma: Secondary | ICD-10-CM | POA: Insufficient documentation

## 2020-01-15 DIAGNOSIS — Z6839 Body mass index (BMI) 39.0-39.9, adult: Secondary | ICD-10-CM | POA: Insufficient documentation

## 2020-01-15 DIAGNOSIS — Z888 Allergy status to other drugs, medicaments and biological substances status: Secondary | ICD-10-CM | POA: Diagnosis not present

## 2020-01-15 DIAGNOSIS — I130 Hypertensive heart and chronic kidney disease with heart failure and stage 1 through stage 4 chronic kidney disease, or unspecified chronic kidney disease: Secondary | ICD-10-CM | POA: Diagnosis not present

## 2020-01-15 NOTE — Progress Notes (Signed)
[name], [name] ([id number]) Visit Report for 01/15/2020 Abuse/Suicide Risk Screen Details Patient Name: Date of Service: [name] 01/15/2020 2:45 PM Medical Record Number: [id number] Patient Account Number: [id number] Date of Birth/Sex: Treating RN: [birth year] (70 y.o. Tonita Phoenix, Lauren Primary Care Ameya Kutz: Everardo Beals Other Clinician: Referring Salih Williamson: Treating Johneisha Broaden/Extender: Corrin Parker in Treatment: 0 Abuse/Suicide Risk Screen Items Answer ABUSE RISK SCREEN: Has anyone close to you tried to hurt or harm you recentlyo No Do you feel uncomfortable with anyone in your familyo No Has anyone forced you do things that you didnt want to doo No Electronic Signature(s) Signed: 01/15/2020 4:05:28 PM By: Rhae Hammock RN Entered By: Rhae Hammock on 01/15/2020 15:11:30 -------------------------------------------------------------------------------- Activities of Daily Living Details Patient Name: Date of Service: [name] 01/15/2020 2:45 PM Medical Record Number: [id number] Patient Account Number: [id number] Date of Birth/Sex: Treating RN: August 21, 1950 (70 y.o. Tonita Phoenix, Lauren Primary Care Kallen Delatorre: Everardo Beals Other Clinician: Referring Prem Coykendall: Treating Thaddius Manes/Extender: Corrin Parker in Treatment: 0 Activities of Daily Living Items Answer Activities of Daily Living (Please select one for each item) Drive Automobile Not Able T Medications ake Completely Able Use T elephone Completely Able Care for Appearance Need Assistance Use T oilet Completely Able Bath / Shower Completely Able Dress Self Completely Able Feed Self Completely Able Walk Need Assistance Get In / Out Bed Completely Able Housework Need Assistance Prepare Meals Completely [place] Need Assistance Shop for Self Need Assistance Electronic Signature(s) Signed: 01/15/2020 4:05:28 PM By: Rhae Hammock RN Entered By: Rhae Hammock on 01/15/2020 15:12:18 -------------------------------------------------------------------------------- Education Screening Details Patient Name: Date of Service: Jamie Bal L. 01/15/2020 2:45 PM Medical Record Number: [id number] Patient Account Number: [id number] Date of Birth/Sex: Treating RN: 04-17-1950 (70 y.o. Tonita Phoenix, Lauren Primary Care Kissie Ziolkowski: Everardo Beals Other Clinician: Referring Joseph Bias: Treating Teigan Manner/Extender: Corrin Parker in Treatment: 0 Primary Learner Assessed: Patient Learning Preferences/Education Level/Primary Language Learning Preference: Explanation, Demonstration, Communication Board, Printed Material Highest Education Level: High School Preferred Language: English Cognitive Barrier Language Barrier: No Translator Needed: No Memory Deficit: No Emotional Barrier: No Cultural/Religious Beliefs Affecting Medical Care: No Physical Barrier Impaired Vision: No Impaired Hearing: No Decreased Hand dexterity: No Knowledge/Comprehension Knowledge Level: High Comprehension Level: High Ability to understand written instructions: High Ability to understand verbal instructions: High Motivation Anxiety Level: Calm Cooperation: Cooperative Education Importance: Denies Need Interest in Health Problems: Asks Questions Perception: Coherent Willingness to Engage in Self-Management High Activities: Readiness to Engage in Self-Management High Activities: Electronic Signature(s) Signed: 01/15/2020 4:05:28 PM By: Rhae Hammock RN Entered By: Rhae Hammock on 01/15/2020 15:12:40 -------------------------------------------------------------------------------- Fall Risk Assessment Details Patient Name: Date of Service: [name], Jamie Martins L. 01/15/2020 2:45 PM Medical Record Number: [id number] Patient Account Number: [id number] Date of Birth/Sex: Treating RN: 06/07/1950 (69 y.o.  Tonita Phoenix, Lauren Primary Care Valmai Vandenberghe: Everardo Beals Other Clinician: Referring Nicoles Sedlacek: Treating Jaiyana Canale/Extender: Corrin Parker in Treatment: 0 Fall Risk Assessment Items Have you had 2 or more falls in the last 12 monthso 0 No Have you had any fall that resulted in injury in the last 12 monthso 0 No FALLS RISK SCREEN History of falling - immediate or within 3 months 0 No Secondary diagnosis (Do you have 2 or more medical diagnoseso) 0 No Ambulatory aid None/bed rest/wheelchair/nurse 0 No Crutches/cane/walker 0 No Furniture 0 No Intravenous therapy Access/Saline/Heparin Lock 0 No Gait/Transferring Normal/ bed rest/  wheelchair 0 No Weak (short steps with or without shuffle, stooped but able to lift head while walking, may seek 0 No support from furniture) Impaired (short steps with shuffle, may have difficulty arising from chair, head down, impaired 0 No balance) Mental Status Oriented to own ability 0 No Electronic Signature(s) Signed: 01/15/2020 4:05:28 PM By: Rhae Hammock RN Entered By: Rhae Hammock on 01/15/2020 15:12:48 -------------------------------------------------------------------------------- Foot Assessment Details Patient Name: Date of Service: Jamie Bal L. 01/15/2020 2:45 PM Medical Record Number: [id number] Patient Account Number: [id number] Date of Birth/Sex: Treating RN: 18-Sep-1950 (70 y.o. Tonita Phoenix, Lauren Primary Care Gabrelle Roca: Everardo Beals Other Clinician: Referring June Vacha: Treating Levorn Oleski/Extender: Corrin Parker in Treatment: 0 Foot Assessment Items Site Locations + = Sensation present, - = Sensation absent, C = Callus, U = Ulcer R = Redness, W = Warmth, M = Maceration, PU = Pre-ulcerative lesion F = Fissure, S = Swelling, D = Dryness Assessment Right: Left: Other Deformity: No No Prior Foot Ulcer: No No Prior Amputation: No No Charcot Joint: No  No Ambulatory Status: Ambulatory With Help Assistance Device: Walker Gait: Steady Electronic Signature(s) Signed: 01/15/2020 4:05:28 PM By: Rhae Hammock RN Entered By: Rhae Hammock on 01/15/2020 15:16:35 -------------------------------------------------------------------------------- Nutrition Risk Screening Details Patient Name: Date of Service: Jamie Bal L. 01/15/2020 2:45 PM Medical Record Number: [id number] Patient Account Number: [id number] Date of Birth/Sex: Treating RN: 1950/10/13 (70 y.o. Tonita Phoenix, Lauren Primary Care Aurianna Earlywine: Everardo Beals Other Clinician: Referring Najai Waszak: Treating Shelsy Seng/Extender: Corrin Parker in Treatment: 0 Height (in): 64 Weight (lbs): 230 Body Mass Index (BMI): 39.5 Nutrition Risk Screening Items Score Screening NUTRITION RISK SCREEN: I have an illness or condition that made me change the kind and/or amount of food I eat 0 No I eat fewer than two meals per day 0 No I eat few fruits and vegetables, or milk products 0 No I have three or more drinks of beer, liquor or wine almost every day 0 No I have tooth or mouth problems that make it hard for me to eat 0 No I don't always have enough money to buy the food I need 0 No I eat alone most of the time 0 No I take three or more different prescribed or over-the-counter drugs a day 0 No Without wanting to, I have lost or gained 10 pounds in the last six months 0 No I am not always physically able to shop, cook and/or feed myself 0 No Nutrition Protocols Good Risk Protocol 0 No interventions needed Moderate Risk Protocol High Risk Proctocol Risk Level: Good Risk Score: 0 Electronic Signature(s) Signed: 01/15/2020 4:05:28 PM By: Rhae Hammock RN Entered By: Rhae Hammock on 01/15/2020 15:12:54

## 2020-01-15 NOTE — Progress Notes (Signed)
[name], [name] ([id number]) Visit Report for 01/15/2020 Chief Complaint Document Details Patient Name: Date of Service: [name] 01/15/2020 2:45 PM Medical Record Number: [id number] Patient Account Number: [id number] Date of Birth/Sex: Treating RN: 1950-08-31 (70 y.o. [name] Primary Care Provider: Everardo Beals Other Clinician: Referring Provider: Treating Provider/Extender: Corrin Parker in Treatment: 0 Information Obtained from: Patient Chief Complaint Patient presents for treatment of an open ulcer due to venous insufficiency to the left lower extremity which she's had for about a couple of weeks 01/15/2020; patient is here for review of weeping edema from her left leg lateral Electronic Signature(s) Signed: 01/15/2020 4:48:18 PM By: Linton Ham MD Entered By: Linton Ham on 01/15/2020 16:28:43 -------------------------------------------------------------------------------- HPI Details Patient Name: Date of Service: Jamie Bal L. 01/15/2020 2:45 PM Medical Record Number: [id number] Patient Account Number: [id number] Date of Birth/Sex: Treating RN: [birth year] (70 y.o. [name] Primary Care Provider: Everardo Beals Other Clinician: Referring Provider: Treating Provider/Extender: Corrin Parker in Treatment: 0 History of Present Illness Location: ulcerated area of the left lower extremity with weeping Quality: Patient reports experiencing a dull pain to affected area(s). Severity: Patient states wound(s) are getting better Duration: Patient has had the wound for > 2 weeks prior to seeking treatment at the wound center Timing: Pain in wound is Intermittent (comes and goes Context: The wound would happen gradually Modifying Factors: Patient wound(s)/ulcer(s) are improving due [id number] her compression stockings regularly ssociated Signs and Symptoms: Patient reports having increase  swelling. A HPI Description: 70 year old patient was recently discharged in January of this year, was treated with compression stockings and had done very well. Recently she noticed some bruising and ulceration on the left anterior calf and had minimal fluid weeping out of this area. She wanted to come and see as as soon as possible but in the last week she feels that the wounds have gotten better and they are not weeping as much she has been very compliant and has been wearing 30-40 mm compression stockings regularly ===== Old Notes 70 year old patient who was recently seen by her cardiologist and has a history of coronary artery disease, status post CABG in [id number], ICM, systolic heart failure, V. tach, status post ICD, OSA, diabetes mellitus type 2, hypertension. She is not a smoker. 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 She is status post thrombolysis with TPA and venous stenting in February and again in September 2017 for extensive bilateral lower extremity DVT He was found to have significant lymphedema of her left lower extremity with the wound . around the left ankle. She is unable to change her compression stockings herself and hence was sent to the wound clinic. 01/31/2016 -- her son got her Juzo compression stockings from [place] and has placed an order with  for more ======== READMISSION 01/15/2020 This is a now 70 year old patient we had in the clinic in 2017 into 2018. At that point she had a wound on her left anterior lower leg. She was discharged with compression stockings. She has a history of severe chronic venous insufficiency, recurrent DVT and lymphedema. By review of her old records she has had s a history of venous stenting on the left but I could not really determine where the stent was placed. In any case she tells [name] about a month ago she noted weeping fluid come out out of the left lateral lower leg. She stopped using  her compression stockings. She is not complaining of pain nor can I get a history of claudication. She arrives in clinic with no open wound on the left leg although there is considerable swelling which is nonpitting and nontender. 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 We did not do arterial study Electronic Signature(s) Signed: 01/15/2020 4:48:18 PM By: Linton Ham MD Entered By: Linton Ham on 01/15/2020 16:31:44 -------------------------------------------------------------------------------- Physical Exam Details Patient Name: Date of Service: Jamie Bal L. 01/15/2020 2:45 PM Medical Record Number: [id number] Patient Account Number: [id number] Date of Birth/Sex: Treating RN: 20-Jan-1950 (70 y.o. [name] Primary Care Provider: Everardo Beals Other Clinician: Referring Provider: Treating Provider/Extender: Corrin Parker in Treatment: 0 Constitutional Patient is hypertensive.. Pulse regular and within target range for patient.Marland Kitchen Respirations regular, non-labored and within target range.. Temperature is normal and within the target range for the patient.Marland Kitchen Appears in no distress. Respiratory work of breathing is normal. Bilateral breath sounds are clear and equal in all lobes with no wheezes, rales or rhonchi.. Cardiovascular No evidence of CHF. Pulses were palpable at the left femoral and left popliteal. Pedal pulses on the left were not palpable there is a delayed capillary refill time. Notes Wound exam; the patient does not have an open wound she does have skin changes on lower extremity of chronic  venous insufficiency with nonpitting edema quite significant swelling in the left lower leg she has a stocking on the right leg. Her stockings are old and certainly need to be replaced Electronic Signature(s) Signed: 01/15/2020 4:48:18 PM By: Linton Ham MD Entered By: Linton Ham on 01/15/2020 16:33:21 -------------------------------------------------------------------------------- Physician Orders Details Patient Name: Date of Service: Jamie Bal L. 01/15/2020 2:45 PM Medical Record Number: [id number] Patient Account Number: [id number] Date of Birth/Sex: Treating RN: December 02, 1950 (70 y.o. [name] Primary Care Provider: Everardo Beals Other Clinician: Referring Provider: Treating Provider/Extender: Corrin Parker in Treatment: 0 Verbal / Phone Orders: No Diagnosis Coding Discharge From [facility] Discharge from [place] Bathing/ Shower/ Hygiene May shower and wash wound with soap and water. Edema Control - Lymphedema / SCD / Other Bilateral Lower Extremities Elevate legs to the level of the heart or above for 30 minutes daily and/or when sitting, a frequency of: - throughout the day Avoid standing for long periods of time. Exercise regularly Moisturize legs daily. - both legs Compression stocking or Garment 20-30 mm/Hg pressure to: - both legs daily Electronic Signature(s) Signed: 01/15/2020 4:46:42 PM By: Baruch Gouty RN, BSN Signed: 01/15/2020 4:48:18 PM By: Linton Ham MD Entered By: Baruch Gouty on 01/15/2020 15:40:16 -------------------------------------------------------------------------------- Problem List Details Patient Name: Date of Service: Jamie Bal L. 01/15/2020 2:45 PM Medical Record Number: [id number] Patient Account Number: [id number] Date of Birth/Sex: Treating RN: Jan 19, 1950 (70 y.o. [name] Primary Care Provider: Everardo Beals Other Clinician: Referring  Provider: Treating Provider/Extender: Corrin Parker in Treatment: 0 Active Problems ICD-10 Encounter Code Description Active Date MDM Diagnosis I87.323 Chronic venous hypertension (idiopathic) with inflammation of bilateral lower 01/15/2020 No Yes extremity I89.0 Lymphedema, not elsewhere classified 01/15/2020 No Yes E11.51 Type 2 diabetes mellitus with  diabetic peripheral angiopathy without gangrene 01/15/2020 No Yes Inactive Problems Resolved Problems Electronic Signature(s) Signed: 01/15/2020 4:48:18 PM By: Linton Ham MD Entered By: Linton Ham on 01/15/2020 16:27:48 -------------------------------------------------------------------------------- Progress Note Details Patient Name: Date of Service: Jamie Bal L. 01/15/2020 2:45 PM Medical Record Number: [id number] Patient Account Number: [id number] Date of Birth/Sex: Treating RN: [birth year] (70 y.o. Martyn Malay, Vaughan Basta Primary Care Provider: Everardo Beals Other Clinician: Referring Provider: Treating Provider/Extender: Corrin Parker in Treatment: 0 Subjective Chief Complaint Information obtained from Patient Patient presents for treatment of an open ulcer due to venous insufficiency to the left lower extremity which she's had for about a couple of weeks 01/15/2020; patient is here for review of weeping edema from her left leg lateral History of Present Illness (HPI) The following HPI elements were documented for the patient's wound: Location: ulcerated area of the left lower extremity with weeping Quality: Patient reports experiencing a dull pain to affected area(s). Severity: Patient states wound(s) are getting better Duration: Patient has had the wound for > 2 weeks prior to seeking treatment at the wound center Timing: Pain in wound is Intermittent (comes and goes Context: The wound would happen gradually Modifying Factors: Patient wound(s)/ulcer(s) are  improving due [id number] her compression stockings regularly Associated Signs and Symptoms: Patient reports having increase swelling. 70 year old patient was recently discharged in January of this year, was treated with compression stockings and had done very well. Recently she noticed some bruising and ulceration on the left anterior calf and had minimal fluid weeping out of this area. She wanted to come and see as as soon as possible but in the last week she feels that the wounds have gotten better and they are not weeping as much she has been very compliant and has been wearing 30-40 mm compression stockings regularly ===== Old Notes 70 year old patient who was recently seen by her cardiologist and has a history of coronary artery disease, status post CABG in [id number], ICM, systolic heart failure, V. tach, status post ICD, OSA, diabetes mellitus type 2, hypertension. She is not a smoker. 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 She is status post thrombolysis with TPA and venous stenting in February and again in September 2017 for extensive bilateral lower extremity DVT He was found to have significant lymphedema of her left lower extremity with the wound . around the left ankle. She is unable to change her compression stockings herself and hence was sent to the wound clinic. 01/31/2016 -- her son got her Juzo compression stockings from [place] and has placed an order with [place] for more ======== READMISSION 01/15/2020 This is a now 70 year old patient we had in the clinic in 2017 into 2018. At that point she had a wound on her left anterior lower leg. She was discharged with compression stockings. She has a history of severe chronic venous insufficiency, recurrent DVT and lymphedema. By review of her old records she has had s a history of venous stenting on the left but I could not really determine where the stent was placed. In any case she tells [name]  about a month ago she noted weeping fluid come out out of the left lateral lower leg. She stopped using her compression stockings. She is not complaining of pain nor can I get a history of claudication. She arrives in clinic with no open wound on the left leg although there is considerable swelling which is nonpitting and nontender. 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 We did not do arterial study Patient History Information obtained from Patient. Allergies metformin, atorvastatin, Levaquin Family History Diabetes - Mother, Stroke - Mother. Social History Never smoker, Marital Status - Separated, Alcohol Use - Never, Drug Use - No History, Caffeine Use - Daily. Medical History Eyes Patient has history of Cataracts, Glaucoma Hematologic/Lymphatic Patient has history of Anemia, Lymphedema Respiratory Patient has history of Sleep Apnea - O2 at hs Cardiovascular Patient has history of Arrhythmia - V tach , defibcardio present, Congestive Heart Failure - chronic combined, Coronary Artery Disease, Deep Vein Thrombosis - extensive bilateral 09/2015, Hypertension Endocrine Patient has history of Type II Diabetes Integumentary (Skin) Denies history of History of Burn Musculoskeletal Patient has history of Rheumatoid Arthritis Denies history of Gout, Osteoarthritis, Osteomyelitis Hospitalization/Surgery History - bivent cardioverter implant. - cardiac cath. - defib placement. - coro bypass graft. - 10 IR accessed to vascular. Medical A Surgical History Notes nd Constitutional Symptoms (General Health) morbid obesity , Respiratory O2 dependent  , Cardiovascular systolic HTN , DVT treated with intravascular TPA and stenting , ejection fraction less than 30% , angina decubitus , angina pectoris , CABG X5 , Review of Systems (ROS) Constitutional Symptoms (General Health) Denies complaints or symptoms of Fatigue, Fever, Chills, Marked Weight Change. Eyes Denies complaints or symptoms of Dry Eyes, Vision Changes, Glasses / Contacts. Ear/Nose/Mouth/Throat Denies complaints or symptoms of Chronic sinus problems or rhinitis. Respiratory Denies complaints or symptoms of Chronic or frequent coughs, Shortness of Breath. Cardiovascular Denies complaints or symptoms of Chest pain. Gastrointestinal Denies complaints or symptoms of Frequent diarrhea, Nausea, Vomiting. Endocrine Denies complaints or symptoms of Heat/cold intolerance. Genitourinary Denies complaints or symptoms of Frequent urination. Integumentary (Skin) Complains or has symptoms of Wounds. Musculoskeletal Denies complaints or symptoms of Muscle Pain, Muscle Weakness. Neurologic Denies complaints or symptoms of Numbness/parasthesias. Psychiatric Denies complaints or symptoms of Claustrophobia, Suicidal. Objective Constitutional Patient is hypertensive.. Pulse regular and within target range for patient.Marland Kitchen Respirations regular, non-labored and within target range.. Temperature is normal and within the target range for the patient.Marland Kitchen Appears in no distress. Vitals Time Taken: 3:08 PM, Height: 64 in, Source: Stated, Weight: 230 lbs, Source: Stated, BMI: 39.5, Temperature: 99.1 F, Pulse: 81 bpm, Respiratory Rate: 18 breaths/min, Blood Pressure: 153/91 mmHg, Capillary Blood Glucose: 158 mg/dl. Respiratory work of breathing is normal. Bilateral breath sounds are clear and equal in all lobes with no wheezes, rales or rhonchi.. Cardiovascular No evidence of CHF. Pulses were palpable at the left femoral and left popliteal. Pedal pulses on the left were not palpable there is a  delayed capillary refill time. General Notes: Wound exam; the patient does not have an open wound she does have skin changes on lower extremity of chronic venous insufficiency with nonpitting edema quite significant swelling in the left lower leg she has a stocking on the right leg. Her stockings are old and certainly need to be replaced Assessment Active Problems ICD-10 Chronic venous hypertension (idiopathic) with inflammation of bilateral lower extremity Lymphedema, not elsewhere classified Type 2 diabetes mellitus with diabetic peripheral angiopathy without gangrene Plan Discharge From [facility] Services: Discharge from [place] Bathing/ Shower/ Hygiene: May shower and wash wound  with soap and water. Edema Control - Lymphedema / SCD / Other: Elevate legs to the level of the heart or above for 30 minutes daily and/or when sitting, a frequency of: - throughout the day Avoid standing for long periods of time. Exercise regularly Moisturize legs daily. - both legs Compression stocking or Garment 20-30 mm/Hg pressure to: - both legs daily 1. The patient does not have a open wound. We gave her measurements for compression stockings. She apparently has an allotment for purchase of medical items through [facility] and she is going to get her stockings this way rather than how we usually keep supply people stockings. I have no idea what compression her current stockings are but she is going to need at least 20/30 compression stockings 2. I had some thoughts about putting this patient in compression however the issue is her arterial status is probably less than ideal although she does not have any complaints of claudication or open wounds. I find it difficult to believe that she would be a candidate for anything aggressive at this point unless she develops open wounds or more symptoms 3. I have asked her to the patient to return if she has more problems with this leg. Especially if she develops  open wounds. She did not want [name] to order compression stockings from our more standard suppliers. I am not sure what she would be able to get in terms of compression through [facility] I spent 35 minutes in review of this patient's past medical history, face-to-face evaluation and preparation of this record Electronic Signature(s) Signed: 01/15/2020 4:48:18 PM By: Linton Ham MD Entered By: Linton Ham on 01/15/2020 16:35:50 -------------------------------------------------------------------------------- HxROS Details Patient Name: Date of Service: Jamie Bal L. 01/15/2020 2:45 PM Medical Record Number: [id number] Patient Account Number: [id number] Date of Birth/Sex: Treating RN: [birth year] (70 y.o. Tonita Phoenix, Lauren Primary Care Provider: Everardo Beals Other Clinician: Referring Provider: Treating Provider/Extender: Corrin Parker in Treatment: 0 Information Obtained From Patient Constitutional Symptoms (General Health) Complaints and Symptoms: Negative for: Fatigue; Fever; Chills; Marked Weight Change Medical History: Past Medical History Notes: morbid obesity , Eyes Complaints and Symptoms: Negative for: Dry Eyes; Vision Changes; Glasses / Contacts Medical History: Positive for: Cataracts; Glaucoma Ear/Nose/Mouth/Throat Complaints and Symptoms: Negative for: Chronic sinus problems or rhinitis Respiratory Complaints and Symptoms: Negative for: Chronic or frequent coughs; Shortness of Breath Medical History: Positive for: Sleep Apnea - O2 at hs Past Medical History Notes: O2 dependent , Cardiovascular Complaints and Symptoms: Negative for: Chest pain Medical History: Positive for: Arrhythmia - V tach , defibcardio present; Congestive Heart Failure - chronic combined; Coronary Artery Disease; Deep Vein Thrombosis - extensive bilateral 09/2015; Hypertension Past Medical History Notes: systolic HTN , DVT treated with intravascular  TPA and stenting , ejection fraction less than 30% , angina decubitus , angina pectoris , CABG X5 , Gastrointestinal Complaints and Symptoms: Negative for: Frequent diarrhea; Nausea; Vomiting Endocrine Complaints and Symptoms: Negative for: Heat/cold intolerance Medical History: Positive for: Type II Diabetes Time with diabetes: 20 years Treated with: Insulin Blood sugar tested every day: Yes Tested : daily Blood sugar testing results: Breakfast: 190 Genitourinary Complaints and Symptoms: Negative for: Frequent urination Integumentary (Skin) Complaints and Symptoms: Positive for: Wounds Medical History: Negative for: History of Burn Musculoskeletal Complaints and Symptoms: Negative for: Muscle Pain; Muscle Weakness Medical History: Positive for: Rheumatoid Arthritis Negative for: Gout; Osteoarthritis; Osteomyelitis Neurologic Complaints and Symptoms: Negative for: Numbness/parasthesias Psychiatric Complaints and Symptoms: Negative for: Claustrophobia;  Suicidal Hematologic/Lymphatic Medical History: Positive for: Anemia; Lymphedema Immunological Oncologic HBO Extended History Items Eyes: Eyes: Cataracts Glaucoma Immunizations Pneumococcal Vaccine: Received Pneumococcal Vaccination: Yes Immunization Notes: unknown Implantable Devices No devices added Hospitalization / Surgery History Type of Hospitalization/Surgery bivent cardioverter implant cardiac cath defib placement coro bypass graft 10 IR accessed to vascular Family and Social History Diabetes: Yes - Mother; Stroke: Yes - Mother; Never smoker; Marital Status - Separated; Alcohol Use: Never; Drug Use: No History; Caffeine Use: Daily; Financial Concerns: No; Food, Clothing or Shelter Needs: No; Support System Lacking: No; Transportation Concerns: No Electronic Signature(s) Signed: 01/15/2020 4:05:28 PM By: Rhae Hammock RN Signed: 01/15/2020 4:48:18 PM By: Linton Ham MD Entered By: Rhae Hammock on 01/15/2020 15:11:21 -------------------------------------------------------------------------------- SuperBill Details Patient Name: Date of Service: [name] 01/15/2020 Medical Record Number: [id number] Patient Account Number: [id number] Date of Birth/Sex: Treating RN: 13-Nov-1950 (70 y.o. [name] Primary Care Provider: Everardo Beals Other Clinician: Referring Provider: Treating Provider/Extender: Corrin Parker in Treatment: 0 Diagnosis Coding ICD-10 Codes Code Description [phone] Chronic venous hypertension (idiopathic) with inflammation of bilateral lower extremity I89.0 Lymphedema, not elsewhere classified E11.51 Type 2 diabetes mellitus with diabetic peripheral angiopathy without gangrene Facility Procedures CPT4 Code: [id number] Description: [id number] - WOUND CARE VISIT-LEV 3 EST PT Modifier: Quantity: 1 Physician Procedures : CPT4 Code Description Modifier [id number] WC PHYS LEVEL 3 NEW PT ICD-10 Diagnosis Description I87.323 Chronic venous hypertension (idiopathic) with inflammation of bilateral lower extremity I89.0 Lymphedema, not elsewhere classified E11.51 Type 2 diabetes  mellitus with diabetic peripheral angiopathy without gangrene Quantity: 1 Electronic Signature(s) Signed: 01/15/2020 4:48:18 PM By: Linton Ham MD Entered By: Linton Ham on 01/15/2020 16:36:18

## 2020-01-15 NOTE — Progress Notes (Signed)
Jamie, Tran ([id number]) Visit Report for 01/15/2020 Allergy List Details Patient Name: Date of Service: [name] 01/15/2020 2:45 PM Medical Record Number: [id number] Patient Account Number: [id number] Date of Birth/Sex: Treating RN: [birth year] (70 y.o. [name], Lauren Primary Care Shaunae Sieloff: Everardo Beals Other Clinician: Referring Kemal Amores: Treating Sheana Bir/Extender: Corrin Parker in Treatment: 0 Allergies Active Allergies metformin Type: Food atorvastatin Type: Food Levaquin Type: Medication Allergy Notes Electronic Signature(s) Signed: 01/15/2020 4:05:28 PM By: Rhae Hammock RN Entered By: Rhae Hammock on 01/15/2020 15:08:57 -------------------------------------------------------------------------------- Arrival Information Details Patient Name: Date of Service: Jamie Bal L. 01/15/2020 2:45 PM Medical Record Number: [id number] Patient Account Number: [id number] Date of Birth/Sex: Treating RN: [birth year] (70 y.o. [name], Lauren Primary Care Lachrisha Ziebarth: Everardo Beals Other Clinician: Referring Ernst Cumpston: Treating Concettina Leth/Extender: Corrin Parker in Treatment: 0 Visit Information Patient Arrived: Charlyn Minerva Time: 15:03 Accompanied By: self Transfer Assistance: None Patient Identification Verified: Yes Secondary Verification Process Completed: Yes Patient Requires Transmission-Based Precautions: No Patient Has Alerts: No History Since Last Visit Added or deleted any medications: No Any new allergies or adverse reactions: No Had a fall or experienced change in activities of daily living that may affect risk of falls: No Signs or symptoms of abuse/neglect since last visito No Hospitalized since last visit: No Implantable device outside of the clinic excluding cellular tissue based products placed in the center since last visit: No Electronic Signature(s) Signed: 01/15/2020  4:05:28 PM By: Rhae Hammock RN Entered By: Rhae Hammock on 01/15/2020 15:03:20 -------------------------------------------------------------------------------- Clinic Level of Care Assessment Details Patient Name: Date of Service: [name] 01/15/2020 2:45 PM Medical Record Number: [id number] Patient Account Number: [id number] Date of Birth/Sex: Treating RN: [birth year] (70 y.o. [name] Primary Care Donya Tomaro: Everardo Beals Other Clinician: Referring Alaila Pillard: Treating Marlow Hendrie/Extender: Corrin Parker in Treatment: 0 Clinic Level of Care Assessment Items TOOL 2 Quantity Score []  - 0 Use when only an EandM is performed on the INITIAL visit ASSESSMENTS - Nursing Assessment / Reassessment X- 1 20 General Physical Exam (combine w/ comprehensive assessment (listed just below) when performed on new pt. evals) X- 1 25 Comprehensive Assessment (HX, ROS, Risk Assessments, Wounds Hx, etc.) ASSESSMENTS - Wound and Skin A ssessment / Reassessment []  - 0 Simple Wound Assessment / Reassessment - one wound []  - 0 Complex Wound Assessment / Reassessment - multiple wounds X- 1 10 Dermatologic / Skin Assessment (not related to wound area) ASSESSMENTS - Ostomy and/or Continence Assessment and Care []  - 0 Incontinence Assessment and Management []  - 0 Ostomy Care Assessment and Management (repouching, etc.) PROCESS - Coordination of Care X - Simple Patient / Family Education for ongoing care 1 15 []  - 0 Complex (extensive) Patient / Family Education for ongoing care X- 1 10 Staff obtains Programmer, systems, Records, T Results / Process Orders est []  - 0 Staff telephones HHA, Nursing Homes / Clarify orders / etc []  - 0 Routine Transfer to another Facility (non-emergent condition) []  - 0 Routine Hospital Admission (non-emergent condition) X- 1 15 New Admissions / Biomedical engineer / Ordering NPWT Apligraf, etc. , []  - 0 Emergency  Hospital Admission (emergent condition) X- 1 10 Simple Discharge Coordination []  - 0 Complex (extensive) Discharge Coordination PROCESS - Special Needs []  - 0 Pediatric / Minor Patient Management []  - 0 Isolation Patient Management []  - 0 Hearing / Language / Visual special needs []  - 0 Assessment of Community assistance (  transportation, D/C planning, etc.) []  - 0 Additional assistance / Altered mentation []  - 0 Support Surface(s) Assessment (bed, cushion, seat, etc.) INTERVENTIONS - Wound Cleansing / Measurement []  - 0 Wound Imaging (photographs - any number of wounds) []  - 0 Wound Tracing (instead of photographs) []  - 0 Simple Wound Measurement - one wound []  - 0 Complex Wound Measurement - multiple wounds []  - 0 Simple Wound Cleansing - one wound []  - 0 Complex Wound Cleansing - multiple wounds INTERVENTIONS - Wound Dressings []  - 0 Small Wound Dressing one or multiple wounds []  - 0 Medium Wound Dressing one or multiple wounds []  - 0 Large Wound Dressing one or multiple wounds []  - 0 Application of Medications - injection INTERVENTIONS - Miscellaneous []  - 0 External ear exam []  - 0 Specimen Collection (cultures, biopsies, blood, body fluids, etc.) []  - 0 Specimen(s) / Culture(s) sent or taken to Lab for analysis []  - 0 Patient Transfer (multiple staff / Civil Service fast streamer / Similar devices) []  - 0 Simple Staple / Suture removal (25 or less) []  - 0 Complex Staple / Suture removal (26 or more) []  - 0 Hypo / Hyperglycemic Management (close monitor of Blood Glucose) []  - 0 Ankle / Brachial Index (ABI) - do not check if billed separately Has the patient been seen at the hospital within the last three years: Yes Total Score: 105 Level Of Care: New/Established - Level 3 Electronic Signature(s) Signed: 01/15/2020 4:46:42 PM By: Baruch Gouty RN, BSN Entered By: Baruch Gouty on 01/15/2020  15:38:37 -------------------------------------------------------------------------------- Encounter Discharge Information Details Patient Name: Date of Service: Jamie Bal L. 01/15/2020 2:45 PM Medical Record Number: [id number] Patient Account Number: [id number] Date of Birth/Sex: Treating RN: 06-Oct-1950 (70 y.o. [name], [name] Primary Care Harpreet Pompey: Everardo Beals Other Clinician: Referring Christyanna Mckeon: Treating Shea Swalley/Extender: Corrin Parker in Treatment: 0 Encounter Discharge Information Items Discharge Condition: Stable Ambulatory Status: Walker Discharge Destination: Home Transportation: Private Auto Accompanied By: self Schedule Follow-up Appointment: No Clinical Summary of Care: Notes Educated patient on compression stockings, purchasing stockings, lotion, exercise, elevation. Patient in agreement. Electronic Signature(s) Signed: 01/15/2020 4:33:40 PM By: Deon Pilling Entered By: Deon Pilling on 01/15/2020 15:57:47 -------------------------------------------------------------------------------- Lower Extremity Assessment Details Patient Name: Date of Service: [name] 01/15/2020 2:45 PM Medical Record Number: [id number] Patient Account Number: [id number] Date of Birth/Sex: Treating RN: October 10, 1950 (70 y.o. [name], Lauren Primary Care Stpehen Petitjean: Everardo Beals Other Clinician: Referring Gerry Blanchfield: Treating Kristyl Athens/Extender: Corrin Parker in Treatment: 0 Edema Assessment Assessed: Shirlyn Goltz: Yes] Patrice Paradise: Yes] Edema: [Left: Yes] [Right: Yes] Calf Left: Right: Point of Measurement: 38 cm From Medial Instep 46 cm 40 cm Ankle Left: Right: Point of Measurement: 12 cm From Medial Instep 24 cm 27 cm Knee To Floor Left: Right: From Medial Instep 40 cm 38 cm Vascular Assessment Pulses: Dorsalis Pedis Palpable: [Left:Yes] Posterior Tibial Palpable: [Left:Yes] Electronic Signature(s) Signed:  01/15/2020 4:05:28 PM By: Rhae Hammock RN Entered By: Rhae Hammock on 01/15/2020 15:32:36 -------------------------------------------------------------------------------- Multi Wound Chart Details Patient Name: Date of Service: Jamie Bal L. 01/15/2020 2:45 PM Medical Record Number: [id number] Patient Account Number: [id number] Date of Birth/Sex: Treating RN: Jun 04, 1950 (70 y.o. [name] Primary Care Diamone Whistler: Everardo Beals Other Clinician: Referring Creta Dorame: Treating Esthela Brandner/Extender: Corrin Parker in Treatment: 0 Vital Signs Height(in): 64 Capillary Blood Glucose(mg/dl): 158 Weight(lbs): 230 Pulse(bpm): 81 Body Mass Index(BMI): 39 Blood Pressure(mmHg): 153/91 Temperature(F): 99.1 Respiratory Rate(breaths/min): 18 Electronic Signature(s)  Signed: 01/15/2020 4:46:42 PM By: Baruch Gouty RN, BSN Signed: 01/15/2020 4:48:18 PM By: Linton Ham MD -------------------------------------------------------------------------------- Pain Assessment Details Patient Name: Date of Service: [name]. 01/15/2020 2:45 PM Medical Record Number: [id number] Patient Account Number: [id number] Date of Birth/Sex: Treating RN: 01-24-1950 (70 y.o. [name], Lauren Primary Care Essam Lowdermilk: Everardo Beals Other Clinician: Referring Cystal Shannahan: Treating Song Garris/Extender: Corrin Parker in Treatment: 0 Active Problems Location of Pain Severity and Description of Pain Patient Has Paino No Site Locations Rate the pain. Current Pain Level: 0 Pain Management and Medication Current Pain Management: Electronic Signature(s) Signed: 01/15/2020 4:05:28 PM By: Rhae Hammock RN Entered By: Rhae Hammock on 01/15/2020 15:13:14 -------------------------------------------------------------------------------- Patient/Caregiver Education Details Patient Name: Date of Service: [name]  1/7/2022andnbsp2:45 PM Medical Record Number: [id number] Patient Account Number: [id number] Date of Birth/Gender: Treating RN: Aug 14, 1950 (70 y.o. [name] Primary Care Physician: Everardo Beals Other Clinician: Referring Physician: Treating Physician/Extender: Corrin Parker in Treatment: 0 Education Assessment Education Provided To: Patient Education Topics Provided Venous: Handouts: Controlling Swelling with Compression Stockings Methods: Explain/Verbal, Printed Responses: Reinforcements needed, State content correctly Wound/Skin Impairment: Methods: Explain/Verbal Responses: Reinforcements needed, State content correctly Electronic Signature(s) Signed: 01/15/2020 4:46:42 PM By: Baruch Gouty RN, BSN Entered By: Baruch Gouty on 01/15/2020 15:37:50 -------------------------------------------------------------------------------- [place] Details Patient Name: Date of Service: Jamie Bal L. 01/15/2020 2:45 PM Medical Record Number: [id number] Patient Account Number: [id number] Date of Birth/Sex: Treating RN: 10-23-1950 (70 y.o. [name], Lauren Primary Care Merlyn Bollen: Everardo Beals Other Clinician: Referring Pepper Kerrick: Treating Tecla Mailloux/Extender: Corrin Parker in Treatment: 0 Vital Signs Time Taken: 15:08 Temperature (F): 99.1 Height (in): 64 Pulse (bpm): 81 Source: Stated Respiratory Rate (breaths/min): 18 Weight (lbs): 230 Blood Pressure (mmHg): 153/91 Source: Stated Capillary Blood Glucose (mg/dl): 158 Body Mass Index (BMI): 39.5 Reference Range: 80 - 120 mg / dl Electronic Signature(s) Signed: 01/15/2020 4:05:28 PM By: Rhae Hammock RN Entered By: Rhae Hammock on 01/15/2020 15:08:47

## 2020-01-18 ENCOUNTER — Ambulatory Visit (INDEPENDENT_AMBULATORY_CARE_PROVIDER_SITE_OTHER): Payer: Medicare HMO | Admitting: Internal Medicine

## 2020-01-18 ENCOUNTER — Encounter: Payer: Self-pay | Admitting: Internal Medicine

## 2020-01-18 ENCOUNTER — Other Ambulatory Visit: Payer: Self-pay

## 2020-01-18 VITALS — BP 128/70 | HR 69 | Ht 63.0 in | Wt 229.0 lb

## 2020-01-18 DIAGNOSIS — I255 Ischemic cardiomyopathy: Secondary | ICD-10-CM | POA: Diagnosis not present

## 2020-01-18 DIAGNOSIS — I447 Left bundle-branch block, unspecified: Secondary | ICD-10-CM | POA: Diagnosis not present

## 2020-01-18 DIAGNOSIS — I5042 Chronic combined systolic (congestive) and diastolic (congestive) heart failure: Secondary | ICD-10-CM | POA: Diagnosis not present

## 2020-01-18 DIAGNOSIS — I2510 Atherosclerotic heart disease of native coronary artery without angina pectoris: Secondary | ICD-10-CM | POA: Diagnosis not present

## 2020-01-18 DIAGNOSIS — I4901 Ventricular fibrillation: Secondary | ICD-10-CM

## 2020-01-18 LAB — CUP PACEART INCLINIC DEVICE CHECK
Battery Remaining Longevity: 34 mo
Brady Statistic RA Percent Paced: 0.62 %
Brady Statistic RV Percent Paced: 0 %
Date Time Interrogation Session: 20220110101950
HighPow Impedance: 37.5784
Implantable Lead Implant Date: 20080423
Implantable Lead Implant Date: 20080423
Implantable Lead Implant Date: 20140918
Implantable Lead Location: 753858
Implantable Lead Location: 753859
Implantable Lead Location: 753860
Implantable Lead Model: 5076
Implantable Lead Model: 6947
Implantable Pulse Generator Implant Date: 20140918
Lead Channel Impedance Value: 375 Ohm
Lead Channel Impedance Value: 475 Ohm
Lead Channel Pacing Threshold Amplitude: 0.5 V
Lead Channel Pacing Threshold Amplitude: 0.5 V
Lead Channel Pacing Threshold Pulse Width: 0.5 ms
Lead Channel Pacing Threshold Pulse Width: 0.5 ms
Lead Channel Sensing Intrinsic Amplitude: 1.3 mV
Lead Channel Sensing Intrinsic Amplitude: 12 mV
Lead Channel Setting Pacing Amplitude: 2 V
Lead Channel Setting Pacing Amplitude: 2.5 V
Lead Channel Setting Pacing Pulse Width: 0.5 ms
Lead Channel Setting Sensing Sensitivity: 0.5 mV
Pulse Gen Serial Number: 7070007

## 2020-01-18 NOTE — Progress Notes (Signed)
EPIC Encounter for ICM Monitoring  Patient Name: [name] is a 70 y.o. female Date: 01/18/2020 Primary Care Physican: Everardo Beals, NP Primary Cardiologist:Skains/Weaver PA Electrophysiologist: Allred 01/13/2020 Office Weight: 230lbs  AT/AF Burden: <1% (taking Xarelto)  Transmission reviewed.  CorVue thoracic impedancenormal.  Prescribed:  Furosemide 40 mg take 1 tablet daily  Labs:   12/09/2019 Creatinine 1.03, BUN 20, Potassium 4.2, Sodium 140, GFR 56-64 12/02/2019 Creatinine 1.29, BUN 30, Potassium 4.9, Sodium 140, GFR 42-49 11/25/2019 Creatinine 0.95, BUN 21, Potassium 5.2, Sodium 138 11/19/2019 Creatinine 0.83, BUN 18, Potassium 4.6, Sodium 139, GFR 71.78  A complete set of results can be found in Results Review.  Recommendations:No changes.  Next ICM clinic phone appointment due:02/22/2020. Next 91 day remote transmission due:01/25/2020.   EP/Cardiology Office Visits:01/18/2020 with Dr Rayann Heman.  04/12/2020 with Dr Marlou Porch.   Copy of ICM check sent to Dr.Allred.   3 month ICM trend: 01/13/2020.      [name], RN 01/18/2020 9:26 AM

## 2020-01-18 NOTE — Patient Instructions (Addendum)
Medication Instructions:  Your physician recommends that you continue on your current medications as directed. Please refer to the Current Medication list given to you today.  Labwork: None ordered.  Testing/Procedures: None ordered.  Follow-Up: Your physician wants you to follow-up in: one year with Thompson Grayer, MD or one of the following Advanced Practice Providers on your designated Care Team:    Chanetta Marshall, NP  Tommye Standard, PA-C  Legrand Como "Jonni Sanger" [place], [place]  Remote monitoring is used to monitor your ICD from home. This monitoring reduces the number of office visits required to check your device to one time per year. It allows [name] to keep an eye on the functioning of your device to ensure it is working properly. You are scheduled for a device check from home on 01/25/20. You may send your transmission at any time that day. If you have a wireless device, the transmission will be sent automatically. After your physician reviews your transmission, you will receive a postcard with your next transmission date.  Any Other Special Instructions Will Be Listed Below (If Applicable).  If you need a refill on your cardiac medications before your next appointment, please call your pharmacy.

## 2020-01-18 NOTE — Progress Notes (Signed)
PCP: Everardo Beals, NP Primary Cardiologist: Dr Marlou Porch Primary EP: Dr Theressa Stamps [name] is a 70 y.o. female who presents today for routine electrophysiology followup.  Since last being seen in our clinic, the patient reports doing very well.  Today, she denies symptoms of palpitations, chest pain,  lower extremity edema, dizziness, presyncope, syncope, or recent ICD shocks.  SOB is stable. She received appropriate ICD therapy 01/18/19 while in the hospital.  She has not had treated arrhythmias since. The patient is otherwise without complaint today.   Past Medical History:  Diagnosis Date  . Abnormal LFTs   . AICD (automatic cardioverter/defibrillator) present   . Angina decubitus ([place]) 11/06/2012  . Angina pectoris ([place])   . Anxiety   . Arthritis   . Back pain   . Bundle branch block 05/2016  . CAD (coronary artery disease) 06/18/2010  . CHF (congestive heart failure) ([place])   . Chronic combined systolic and diastolic CHF, NYHA class 3 ([place])   . Chronic combined systolic and diastolic heart failure ([place])   . Chronic systolic dysfunction of left ventricle 09/24/2012  . Coronary artery disease    s/p CABG 2007  . Depression   . Dermal mycosis   . Diabetes mellitus    type II  . DKA (diabetic ketoacidoses) 01/12/2019  . DVT (deep venous thrombosis) ([place]) 09/2015   bilateral  . Edema, lower extremity   . Glaucoma   . Hyperlipidemia   . Hypertension   . Hypoglycemia 09/09/2012  . Insomnia   . Insulin dependent diabetes mellitus ([place]) 04/23/2012  . Ischemic cardiomyopathy    EF 30-35%, s/p ICD 4/08 by Dr Leonia Reeves  . Joint pain   . LBBB (left bundle branch block) 09/24/2012  . Morbid obesity ([place]) 11/06/2012  . Obesity   . Oxygen dependent 2 L  . Pain in left knee   . Peripheral neuropathy   . 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 Sleep apnea    uses O2 at night  . Tinea   .  Type 2 diabetes mellitus with hyperglycemia, with long-term current use of insulin ([place]) 04/20/2019  . Uncontrolled diabetes mellitus ([place]) 02/2019  . Ventricular tachycardia ([place]) 01/16/15   sustained VT terminated with ATP, CL 340 msec  . Vitamin B 12 deficiency 11/05/2018  . Vitamin D deficiency 11/05/2018   Past Surgical History:  Procedure Laterality Date  . BI-VENTRICULAR IMPLANTABLE CARDIOVERTER DEFIBRILLATOR UPGRADE N/A 09/25/2012   Procedure: BI-VENTRICULAR IMPLANTABLE CARDIOVERTER DEFIBRILLATOR UPGRADE;  Surgeon: Coralyn Mark, MD;  Location: [facility] CATH LAB;  Service: Cardiovascular;  Laterality: N/A;  . BREAST BIOPSY Right 04/05/2014  . CARDIAC CATHETERIZATION    . CARDIAC DEFIBRILLATOR PLACEMENT  4/08   by Dr Leonia Reeves (MDT)  . COLONOSCOPY WITH PROPOFOL N/A 10/12/2016   Procedure: COLONOSCOPY WITH PROPOFOL;  Surgeon: Doran Stabler, MD;  Location: [facility] ENDOSCOPY;  Service: Gastroenterology;  Laterality: N/A;  . CORONARY ARTERY BYPASS GRAFT  10/05/2005   by Dr Cyndia Bent  . IMPLANTABLE CARDIOVERTER DEFIBRILLATOR IMPLANT  09/25/12   attempt of upgrade to CRT-D unsuccessful due to CS anatomy, SJM Unify [place]aura device placed with LV port capped by Dr Rayann Heman  . IR GENERIC HISTORICAL  10/03/2015   IR US GUIDE VASC ACCESS LEFT 10/03/2015 Jacqulynn Cadet, MD [facility]  . IR GENERIC HISTORICAL  10/03/2015   IR VENO/EXT/UNI LEFT  10/03/2015 Jacqulynn Cadet, MD [facility]  . IR GENERIC HISTORICAL  10/03/2015   IR VENOCAVAGRAM IVC 10/03/2015 Jacqulynn Cadet, MD [facility]  . IR GENERIC HISTORICAL  10/03/2015   IR INFUSION THROMBOL VENOUS INITIAL (MS) 10/03/2015 Jacqulynn Cadet, MD [facility]  . IR GENERIC HISTORICAL  10/03/2015   IR US GUIDE VASC ACCESS LEFT 10/03/2015 Jacqulynn Cadet, MD [facility]  . IR GENERIC HISTORICAL  10/04/2015   IR THROMBECT VENO MECH MOD SED 10/04/2015 Sandi Mariscal, MD [facility]  . IR GENERIC HISTORICAL  10/04/2015   IR TRANSCATH PLC STENT 1ST ART NOT LE CV  CAR VERT CAR 10/04/2015 Sandi Mariscal, MD [facility]  . IR GENERIC HISTORICAL  10/04/2015   IR THROMB F/U EVAL ART/VEN FINAL DAY (MS) 10/04/2015 Sandi Mariscal, MD [facility]  . IR GENERIC HISTORICAL  11/02/2015   IR RADIOLOGIST EVAL & MGMT 11/02/2015 Sandi Mariscal, MD [facility]  . IR RADIOLOGIST EVAL & MGMT  04/26/2016  . LEFT HEART CATH AND CORS/GRAFTS ANGIOGRAPHY N/A 01/19/2019   Procedure: LEFT HEART CATH AND CORS/GRAFTS ANGIOGRAPHY;  Surgeon: Belva Crome, MD;  Location: [place] CV LAB;  Service: Cardiovascular;  Laterality: N/A;    ROS- all systems are reviewed and negative except as per HPI above  Current Outpatient Medications  Medication Sig Dispense Refill  . albuterol (PROVENTIL HFA;VENTOLIN HFA) 108 (90 BASE) MCG/ACT inhaler Inhale 2 puffs into the lungs every 6 (six) hours as needed for wheezing or shortness of breath.    Marland Kitchen albuterol (PROVENTIL) (2.5 MG/3ML) 0.083% nebulizer solution Take 2.5 mg by nebulization every 6 (six) hours as needed for wheezing or shortness of breath.     Marland Kitchen amitriptyline (ELAVIL) 10 MG tablet Take 10 mg by mouth daily.    . Capsaicin (MUSCLE RELIEF EX) Apply 1 application topically daily. For pain    . carvedilol (COREG) 6.25 MG tablet Take 1 tablet (6.25 mg total) by mouth 2 (two) times daily with a meal. 60 tablet 0  . cyclobenzaprine (FLEXERIL) 5 MG tablet Take 5 mg by mouth 3 (three) times daily as needed for muscle spasms.    . dapagliflozin propanediol (FARXIGA) 5 MG TABS tablet Take 1 tablet (5 mg total) by mouth daily before breakfast. 30 tablet 6  . ferrous sulfate 325 (65 FE) MG EC tablet Take 325 mg by mouth daily.     . fluconazole (DIFLUCAN) 150 MG tablet Take 150 mg by mouth every 7 (seven) days.    . furosemide (LASIX) 40 MG tablet Take 1 tablet (40 mg total) by mouth daily. 90 tablet 1  . insulin degludec (TRESIBA FLEXTOUCH) 100 UNIT/ML FlexTouch Pen Inject 0.22 mLs (22 Units total) into the skin daily. 30 mL 3  . Insulin Pen Needle  32G X 4 MM MISC 1 Device by Does not apply route in the morning, at noon, in the evening, and at bedtime. 400 each 3  . isosorbide dinitrate (ISORDIL) 30 MG tablet Take 30 mg by mouth every morning.    . latanoprost (XALATAN) 0.005 % ophthalmic solution Place 1 drop into both eyes at bedtime.    Marland Kitchen lisinopril (ZESTRIL) 2.5 MG tablet Take 1 tablet (2.5 mg total) by mouth daily. 90 tablet 1  . meloxicam (MOBIC) 15 MG tablet Take 15 mg by mouth daily.    . methocarbamol (ROBAXIN) 500 MG tablet Take 1 tablet (500 mg total) by mouth 2 (two) times daily as needed for muscle spasms.    . nitroGLYCERIN (NITROSTAT)  0.4 MG SL tablet Place 0.4 mg under the tongue every 5 (five) minutes as needed for chest pain.    Marland Kitchen NOVOLOG FLEXPEN 100 UNIT/ML FlexPen Inject 6 Units into the skin 3 (three) times daily with meals. 30 mL 3  . rosuvastatin (CRESTOR) 20 MG tablet Take 1 tablet (20 mg total) by mouth daily at 6 PM. 30 tablet 2  . Semaglutide, 1 MG/DOSE, (OZEMPIC, 1 MG/DOSE,) 2 MG/1.5ML SOPN Inject 0.75 mLs (1 mg total) into the skin once a week. 3 pen 3  . traMADol (ULTRAM) 50 MG tablet Take 50 mg by mouth 2 (two) times daily.    . Vitamin D, Ergocalciferol, (DRISDOL) 1.25 MG (50000 UT) CAPS capsule Take 1 capsule (50,000 Units total) by mouth every 7 (seven) days. 4 capsule 0  . XARELTO 20 MG TABS tablet Take 20 mg by mouth daily.      No current facility-administered medications for this visit.    Physical Exam: Vitals:   01/18/20 0949  BP: 128/70  Pulse: 69  SpO2: 99%  Weight: 229 lb (103.9 kg)  Height: 5\' 3"  (1.6 m)    GEN- The patient is overweight appearing, alert and oriented x 3 today.   Head- normocephalic, atraumatic Eyes-  Sclera clear, conjunctiva pink Ears- hearing intact Oropharynx- clear Lungs-   normal work of breathing Chest- ICD pocket is well healed Heart- Regular rate and rhythm  GI- soft  Extremities- no clubbing, cyanosis, or edema  ICD interrogation- reviewed in detail  today,  See PACEART report  ekg tracing ordered today is personally reviewed and shows sinus with LBBB, PVCs  Wt Readings from Last 3 Encounters:  01/18/20 229 lb (103.9 kg)  01/13/20 230 lb (104.3 kg)  11/19/19 223 lb 8 oz (101.4 kg)    Assessment and Plan:  1.  Chronic systolic dysfunction/ ischemic CM/ LBBB euvolemic today No ischemic symptoms Stable on an appropriate medical regimen Normal ICD function See [place] report No changes today she is not device dependant today followed in ICM device clinic She has a BiV Device with the LV port plugged.   She has discussed with CV surgery and is not interested in epicardial LV lead previously. She did have appropriate ICD shock therapy for VF 01/18/19  2. Obesity Lifestyle modification again advised  3. Ventricular fibrillation Normal ICD therapy as above  Risks, benefits and potential toxicities for medications prescribed and/or refilled reviewed with patient today.   Return to see EP PA in a year  Thompson Grayer MD, [facility] 01/18/2020 10:06 AM

## 2020-01-25 ENCOUNTER — Ambulatory Visit (INDEPENDENT_AMBULATORY_CARE_PROVIDER_SITE_OTHER): Payer: Medicare HMO

## 2020-01-25 DIAGNOSIS — I255 Ischemic cardiomyopathy: Secondary | ICD-10-CM | POA: Diagnosis not present

## 2020-01-25 DIAGNOSIS — I5042 Chronic combined systolic (congestive) and diastolic (congestive) heart failure: Secondary | ICD-10-CM

## 2020-01-27 LAB — CUP PACEART REMOTE DEVICE CHECK
Battery Remaining Longevity: 30 mo
Battery Remaining Percentage: 28 %
Battery Voltage: 2.86 V
Brady Statistic AP VP Percent: 1 %
Brady Statistic AP VS Percent: 1 %
Brady Statistic AS VP Percent: 1 %
Brady Statistic AS VS Percent: 99 %
Brady Statistic RA Percent Paced: 1 %
Brady Statistic RV Percent Paced: 1 %
Date Time Interrogation Session: 20220117034334
HighPow Impedance: 42 Ohm
HighPow Impedance: 42 Ohm
Implantable Lead Implant Date: 20080423
Implantable Lead Implant Date: 20080423
Implantable Lead Implant Date: 20140918
Implantable Lead Location: 753858
Implantable Lead Location: 753859
Implantable Lead Location: 753860
Implantable Lead Model: 5076
Implantable Lead Model: 6947
Implantable Pulse Generator Implant Date: 20140918
Lead Channel Impedance Value: 430 Ohm
Lead Channel Impedance Value: 440 Ohm
Lead Channel Pacing Threshold Amplitude: 0.5 V
Lead Channel Pacing Threshold Amplitude: 0.5 V
Lead Channel Pacing Threshold Pulse Width: 0.5 ms
Lead Channel Pacing Threshold Pulse Width: 0.5 ms
Lead Channel Sensing Intrinsic Amplitude: 1.3 mV
Lead Channel Sensing Intrinsic Amplitude: 12 mV
Lead Channel Setting Pacing Amplitude: 2 V
Lead Channel Setting Pacing Amplitude: 2.5 V
Lead Channel Setting Pacing Pulse Width: 0.5 ms
Lead Channel Setting Sensing Sensitivity: 0.5 mV
Pulse Gen Serial Number: 7070007

## 2020-02-08 NOTE — Progress Notes (Signed)
Remote ICD transmission.   

## 2020-02-12 ENCOUNTER — Other Ambulatory Visit: Payer: Self-pay

## 2020-02-12 NOTE — Patient Instructions (Signed)
Goals    . Patient Stated     She would like to feel more safe with her mobility inside and outside her house. (upright rollator would be beneficial)    . Patient Stated     Pt would like to feel more safe up and down from her toilet (grab bar would help with this)    . Patient Stated     Safety in and out of her house (ramp/deck at front door and ramp v. rail at back door is recommended)    . Patient Stated     Would like to be more independent with her basic ADLs (reacher, sock aid, long sponge, and ted hose donner will help with this, as well as possibly walk in shower stall v. Tub bench) IN PROGRESS  ACTION PLANNING - DRESSING/BATHING Target Problem Area: Difficulty with bathing and dressing  Why Problem May Occur: Difficulty getting to her lower body      Target Goal: Independence with lower body bathing and dressing  STRATEGIES Saving Your Energy: DO: DON'T:  Sit down to do tasks - can sit on toilet Stand too long while dressing  Use appropriate adaptive equipment:  long handled sponge, sock aid, reacher, ted hose donner   Keep all items you'll need within easy reach    Simplifying the way you set up tasks or daily routines: DO: DON'T:  Wear sturdy shoes that grip the floor   Gather all items before getting started   Wear clothing that is easily manipulated Wear loose, flowing clothes   PRACTICE It is important to practice the strategies so we can determine if they will be effective in helping to reach your goal. Follow these specific recommendations: 1.Try and get all lower body clothing on/started with adaptive equipment before standing up  (this save energy)  2. Always sit to do/start lower body dressing and to bath lower legs and feet  If a strategy does not work the first time, try it again and again (and maybe again). We may make some changes over the next few sessions, based on how they work.  [place], OTR/L        12/09/2019

## 2020-02-12 NOTE — Patient Outreach (Signed)
Aging Gracefully Program  RN Visit  02/12/2020  [name] January 18, 1950 [id number]  Visit:   RN home visit #4  Start Time:   1100 End Time:   1130 Total Minutes:   30  Readiness To Change Score:     Universal RN Interventions: Calendar Distribution: Yes Exercise Review: Yes Medications: Yes Medication Changes: Yes Mood: Yes Pain: Yes PCP Advocacy/Support: No Fall Prevention: Yes Clinician View Of Client Situation: Covid screening negative.   Arrived to home visit and patient yelled for me to come in. Entered the home. Dogs barking. Home cluttered. observed patient walking from door back to couch and independently putting her oxygen back on. Client View Of His/Her Situation: Patient reports she is doing well. Denies any falls. Reports she is waiting for contracts. Reports she spoke with LIsa at community housing about contracts and the write ups are not completed.  Patient reports she had 2 MD appointments recently that went well. Denies any medications changes.Reports she continues to do her home exercises. Reports she thinks they help her.  reports no falls. Reports sleeping well.  Healthcare Provider Communication: Did Surveyor, mining With [employer] Provider?: No  Clinician View of Client Situation: Clinician View Of Client Situation: Covid screening negative.   Arrived to home visit and patient yelled for me to come in. Entered the home. Dogs barking. Home cluttered. observed patient walking from door back to couch and independently putting her oxygen back on. Client's View of His/Her Situation: Client View Of His/Her Situation: Patient reports she is doing well. Denies any falls. Reports she is waiting for contracts. Reports she spoke with LIsa at community housing about contracts and the write ups are not completed.  Patient reports she had 2 MD appointments recently that went well. Denies any medications changes.Reports she continues to do her home exercises. Reports  she thinks they help her.  reports no falls. Reports sleeping well.  Medication Assessment: no changes in medications    OT Update: Email sent to OT to inform home visits completed. Reviewed process of Aging gracefully moving forward.    Goals Addressed            This Visit's Progress   . COMPLETED: Aging Gracefully RN: Prevent falls   On track    CLIENT/RN ACTION PLAN - FALL PREVENTION  Registered Nurse:  Ricke Hey, RN Date: 12/08/19  [name] Client ID:  [id number] Feb 10, 2050   Target Area:  FALL PREVENTION    Why Problem May Occur: -- previous history of falls -- limited mobility -- chronic knee and back pain -- some medications that you currently take -- environmental variables in your home: loose rugs with wrinkles/ creases, etc  Target Goal: to prevent falls in the future   STRATEGIES Coping Strategies Ideas  Change Positions Slowly: lying to sitting, sitting to standing MOVE SLOWLY DO NOT RUSH- for anything USE CANE/ WALKER . Changing positions can make people lightheaded. . When getting up in the morning sit for a few minutes, before standing.  . Stand for a few minutes before walking and hold on to sturdy furniture or countertop.  .   Drink water- don't allow yourself to become dehydrated . Dehydration can make people dizzy. . Ask your healthcare provider how much water you can drink. . Decrease caffeine, caffeine makes you urinate a lot, which can make you dehydrated.   If you fall, tell someone, and tell them what caused you to fall:  -- write down any  falls that occur on your calendar: sometimes internal injuries may take awhile to show symptoms, so you don't want to forget the details of the fall . Tell a friend or family member even if you didn't get hurt. . Tell your healthcare provider if you fall.  They can help you figure out why.   Get your vision and hearing checked -- keep up the good work going to regular eye exams . Poor vision and  hearing loss can make people fall. . Glaucoma and diabetes can cause poor vision. .   Other .    Prevention Ideas  Review your Medicines -- NTG can cause your blood pressure to drop: always sit down if you need to use this medication -- Insulin can cause a quick drop in your blood sugar which could lead to falls-- this is especially true with your meal time insulin -- pain medication can make you feel sleepy  . Many medicines can make you dizzy or sleepy and increase your risk of falling. . Your Aging Gracefully Nurse will look at your medications and see if you are taking any that might cause falling.   Activity and Exercise -- stay as active as possible, make time for regular exercises and stretching; try to incorporate into your regular routine -- start gradually and build up over time -- do not over-do on exercise . Aging Gracefully exercises- do the ones that you are able to do while sitting; if you try the standing exercises, make sure your family is with you by your side . Walking (inside or outside) . Dancing . Gardening . Housework:  cooking, Education administrator, Medical sales representative . Exercise while watching TV . Swimming or water aerobics. .   Strengthen Bones- continue sitting in sun regularly -- ask your doctor if you should add extra Calcium to the the Vitamin D you are already taking- calcium helps the vitamin D be absorbed better . Exercise makes your bones stronger. . Vitamin D and Calcium make your bones stronger.  Ask your Healthcare provider if you should be taking them. . Your body makes vitamin D from the sun.  Sit in the sun for 10 minutes every day (without sunscreen). .   Control your urine  -- try the Kegel exercises in the list of exercises that was provided -- talk to your doctor about possible treatments for urinary incontinence/ bladder control . Prevent constipation . Cut back on caffeinated drinks. . Don't wait to urinate. . If diabetic, control your blood sugar. . Ask your  Aging Gracefully Nurse and Healthcare Provider  about urine control. .   Control your blood sugar (if you are diabetic) -- be very careful if you have drops in your blood sugar-- if this starts happening more frequently be sure to tell your doctor . High blood sugar can cause frequent urination, poor vision, and numbness in your feet, which can make you fall.   . Low blood sugar can cause confusion and dizziness. .   Other coping strategies 1. Be aware of weather conditions at all times and modify your activities accordingly 2.  Always wear sturdy shoes with good soles that will not slip 3.  Consider removing the loose rugs in your home and clearing out the pathways in your home so there are no fall hazards to trip you up 4. 5.   PRACTICE It is important to practice the strategies so we can determine if they will be effective in helping to reach the goal.  Follow these specific recommendations:        If strategy does not work the first time, try it again.     We may make some changes over the next few sessions.      Oneta Rack, RN, BSN, [facility] Care Management  [phone]    01/14/2020  Assessment:  denies any new falls. Reports doing home exercises 3 times per week.  Reports pain 10/10. On new medications to help with lower back pain. Reports new meds are helping.  Intervention:  reviewed fall precautions, reviewed importance of getting wrinkles out of carpet. Reviewed tripping hazards of throw rungs and animals in the home.   Encouraged patient to continue to do home exercises.   Tomasa Rand, RN, BSN, CEN [place] Coordinator [phone]    02/12/2020  Assessment: no recent falls. Doing home exercises daily.  Continues to have chronic pain.  Interventions:  reviewed fall precautions and tripping hazards. Encouraged patient to continue to do home exercises.  Reviewed process for home modifications and OT. Informed  patient that this is the last home visit. Reviewed OT visits will occur after modifications to home.   Tomasa Rand, RN, BSN, CEN [facility] [employer] [phone]        Session Summary: Completed RN home visit.  Pending contracts. OT updated.  No recent falls. Reviewed fall precautions and tripping hazards again. Encouraged continuation of home exercise plan.  Tomasa Rand, RN, BSN, CEN [facility] [employer] [phone]

## 2020-02-16 ENCOUNTER — Other Ambulatory Visit: Payer: Self-pay

## 2020-02-16 ENCOUNTER — Other Ambulatory Visit: Payer: Medicare HMO

## 2020-02-16 DIAGNOSIS — I472 Ventricular tachycardia, unspecified: Secondary | ICD-10-CM

## 2020-02-16 DIAGNOSIS — I10 Essential (primary) hypertension: Secondary | ICD-10-CM

## 2020-02-16 DIAGNOSIS — E119 Type 2 diabetes mellitus without complications: Secondary | ICD-10-CM

## 2020-02-16 DIAGNOSIS — I2510 Atherosclerotic heart disease of native coronary artery without angina pectoris: Secondary | ICD-10-CM

## 2020-02-16 DIAGNOSIS — Z95810 Presence of automatic (implantable) cardiac defibrillator: Secondary | ICD-10-CM

## 2020-02-16 DIAGNOSIS — I5042 Chronic combined systolic (congestive) and diastolic (congestive) heart failure: Secondary | ICD-10-CM

## 2020-02-16 DIAGNOSIS — I700 Atherosclerosis of aorta: Secondary | ICD-10-CM

## 2020-02-16 LAB — BASIC METABOLIC PANEL
BUN/Creatinine Ratio: 25 (ref 12–28)
BUN: 26 mg/dL (ref 8–27)
CO2: 23 mmol/L (ref 20–29)
Calcium: 9.8 mg/dL (ref 8.7–10.3)
Chloride: 100 mmol/L (ref 96–106)
Creatinine, Ser: 1.04 mg/dL — ABNORMAL HIGH (ref 0.57–1.00)
GFR calc Af Amer: 63 mL/min/{1.73_m2} (ref >59)
GFR calc non Af Amer: 55 mL/min/{1.73_m2} — ABNORMAL LOW (ref >59)
Glucose: 180 mg/dL — ABNORMAL HIGH (ref 65–99)
Potassium: 4.4 mmol/L (ref 3.5–5.2)
Sodium: 137 mmol/L (ref 134–144)

## 2020-02-24 ENCOUNTER — Other Ambulatory Visit: Payer: Self-pay

## 2020-02-25 ENCOUNTER — Encounter: Payer: Self-pay | Admitting: Internal Medicine

## 2020-02-25 ENCOUNTER — Ambulatory Visit (INDEPENDENT_AMBULATORY_CARE_PROVIDER_SITE_OTHER): Payer: Medicare HMO | Admitting: Internal Medicine

## 2020-02-25 VITALS — BP 130/80 | HR 70 | Ht 63.0 in | Wt 227.5 lb

## 2020-02-25 DIAGNOSIS — E1159 Type 2 diabetes mellitus with other circulatory complications: Secondary | ICD-10-CM

## 2020-02-25 DIAGNOSIS — Z794 Long term (current) use of insulin: Secondary | ICD-10-CM | POA: Diagnosis not present

## 2020-02-25 DIAGNOSIS — E1142 Type 2 diabetes mellitus with diabetic polyneuropathy: Secondary | ICD-10-CM

## 2020-02-25 DIAGNOSIS — E1165 Type 2 diabetes mellitus with hyperglycemia: Secondary | ICD-10-CM

## 2020-02-25 LAB — POCT GLYCOSYLATED HEMOGLOBIN (HGB A1C): Hemoglobin A1C: 6.1 % — AB (ref 4.0–5.6)

## 2020-02-25 LAB — POCT GLUCOSE (DEVICE FOR HOME USE): POC Glucose: 88 mg/dl (ref 70–99)

## 2020-02-25 MED ORDER — DEXCOM G6 TRANSMITTER MISC: 1.0000 | 3 refills | Status: DC

## 2020-02-25 MED ORDER — DEXCOM G6 SENSOR MISC: 1.0000 | 3 refills | Status: DC

## 2020-02-25 MED ORDER — TRESIBA FLEXTOUCH 100 UNIT/ML ~~LOC~~ SOPN
20.0000 [IU] | PEN_INJECTOR | Freq: Every day | SUBCUTANEOUS | 3 refills | Status: DC
Start: 2020-02-25 — End: 2020-05-26

## 2020-02-25 MED ORDER — DAPAGLIFLOZIN PROPANEDIOL 10 MG PO TABS: 10.0000 mg | ORAL_TABLET | Freq: Every day | ORAL | 3 refills | Status: DC

## 2020-02-25 NOTE — Progress Notes (Signed)
Name: [name]  Age/ Sex: 70 y.o., female   MRN/ DOB: [id number], 1950/09/05     PCP: Everardo Beals, NP   Reason for Endocrinology Evaluation: Type 2 Diabetes Mellitus  Initial Endocrine Consultative Visit: 04/20/2019    PATIENT IDENTIFIER: [name] is a 70 y.o. female with a past medical history of DM, HTN, Dyslipidemia and CAD.. The patient has followed with Endocrinology clinic since 04/20/2019 for consultative assistance with management of her diabetes.  DIABETIC HISTORY:  [name] was diagnosed with T2DM many years ago, she is intolerant to Metformin, has been on Glipizide without reported intolerance. Her hemoglobin A1c has ranged from 7.8% in 2017, peaking at 9.5% in 2014.  On her initial visit to our clinic she had an A1c of 7.7%  , she was on MDI regimen and Ozempic. Marland Kitchen We switched levemir to tresiba and continued Ozempic and Novolog.    farxiga started 11/2019 SUBJECTIVE:   During the last visit (11/19/2019): A1c 5.8%, We reduced insulin dose and continued Ozempic and started [place]      Today (02/25/2020): [name] is here for a follow up on diabetes.  She checks her blood sugar 3 times daily, preprandial. The patient has had hypoglycemic episodes since the last clinic visit, which typically occur 1 x /month  - most often occuring during the day. The patient is  symptomatic with these episodes.  Denies nausea and diarrhea   Needs new shoes   HOME DIABETES REGIMEN:  Tresiba  20 units ONCE  Daily  Novolog 6 units with each meal  Ozempic 1 mg once weekly  Farxiga 5 mg daily     Statin: Yes ACE-I/ARB: No      GLUCOSE LOG 67- 255 mg/dL     DIABETIC COMPLICATIONS: Microvascular complications:   Neuropathy  Denies: CKD, retinopathy   Last Eye Exam: Completed 2019  Macrovascular complications:   CAD, CHF  Denies: CVA, PVD   HISTORY:  Past Medical History:  Past Medical History:  Diagnosis Date  . Abnormal LFTs   .  AICD (automatic cardioverter/defibrillator) present   . Angina decubitus ([place]) 11/06/2012  . Angina pectoris ([place])   . Anxiety   . Arthritis   . Back pain   . Bundle branch block 05/2016  . CAD (coronary artery disease) 06/18/2010  . CHF (congestive heart failure) ([place])   . Chronic combined systolic and diastolic CHF, NYHA class 3 ([place])   . Chronic combined systolic and diastolic heart failure ([place])   . Chronic systolic dysfunction of left ventricle 09/24/2012  . Coronary artery disease    s/p CABG 2007  . Depression   . Dermal mycosis   . Diabetes mellitus    type II  . DKA (diabetic ketoacidoses) 01/12/2019  . DVT (deep venous thrombosis) ([place]) 09/2015   bilateral  . Edema, lower extremity   . Glaucoma   . Hyperlipidemia   . Hypertension   . Hypoglycemia 09/09/2012  . Insomnia   . Insulin dependent diabetes mellitus ([place]) 04/23/2012  . Ischemic cardiomyopathy    EF 30-35%, s/p ICD 4/08 by Dr Leonia Reeves  . Joint pain   . LBBB (left bundle branch block) 09/24/2012  . Morbid obesity ([place]) 11/06/2012  . Obesity   . Oxygen dependent 2 L  . Pain in left knee   . Peripheral neuropathy   . 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 Sleep apnea    uses O2 at night  . Tinea   . Type 2 diabetes mellitus with hyperglycemia, with long-term current use of insulin ([place]) 04/20/2019  . Uncontrolled diabetes mellitus ([place]) 02/2019  . Ventricular tachycardia ([place]) 01/16/15   sustained VT terminated with ATP, CL 340 msec  . Vitamin B 12 deficiency 11/05/2018  . Vitamin D deficiency 11/05/2018   Past Surgical History:  Past Surgical History:  Procedure Laterality Date  . BI-VENTRICULAR IMPLANTABLE CARDIOVERTER DEFIBRILLATOR UPGRADE N/A 09/25/2012   Procedure: BI-VENTRICULAR IMPLANTABLE CARDIOVERTER DEFIBRILLATOR UPGRADE;  Surgeon: Coralyn Mark, MD;  Location: [facility] CATH LAB;  Service: Cardiovascular;  Laterality:  N/A;  . BREAST BIOPSY Right 04/05/2014  . CARDIAC CATHETERIZATION    . CARDIAC DEFIBRILLATOR PLACEMENT  4/08   by Dr Leonia Reeves (MDT)  . COLONOSCOPY WITH PROPOFOL N/A 10/12/2016   Procedure: COLONOSCOPY WITH PROPOFOL;  Surgeon: Doran Stabler, MD;  Location: [facility] ENDOSCOPY;  Service: Gastroenterology;  Laterality: N/A;  . CORONARY ARTERY BYPASS GRAFT  10/05/2005   by Dr Cyndia Bent  . IMPLANTABLE CARDIOVERTER DEFIBRILLATOR IMPLANT  09/25/12   attempt of upgrade to CRT-D unsuccessful due to CS anatomy, SJM Unify Asaura device placed with LV port capped by Dr Rayann Heman  . IR GENERIC HISTORICAL  10/03/2015   IR US GUIDE VASC ACCESS LEFT 10/03/2015 Jacqulynn Cadet, MD [facility]  . IR GENERIC HISTORICAL  10/03/2015   IR VENO/EXT/UNI LEFT 10/03/2015 Jacqulynn Cadet, MD [facility]  . IR GENERIC HISTORICAL  10/03/2015   IR VENOCAVAGRAM IVC 10/03/2015 Jacqulynn Cadet, MD [facility]  . IR GENERIC HISTORICAL  10/03/2015   IR INFUSION THROMBOL VENOUS INITIAL (MS) 10/03/2015 Jacqulynn Cadet, MD [facility]  . IR GENERIC HISTORICAL  10/03/2015   IR US GUIDE VASC ACCESS LEFT 10/03/2015 Jacqulynn Cadet, MD [facility]  . IR GENERIC HISTORICAL  10/04/2015   IR THROMBECT VENO MECH MOD SED 10/04/2015 Sandi Mariscal, MD [facility]  . IR GENERIC HISTORICAL  10/04/2015   IR TRANSCATH PLC STENT 1ST ART NOT LE CV CAR VERT CAR 10/04/2015 Sandi Mariscal, MD [facility]  . IR GENERIC HISTORICAL  10/04/2015   IR THROMB F/U EVAL ART/VEN FINAL DAY (MS) 10/04/2015 Sandi Mariscal, MD [facility]  . IR GENERIC HISTORICAL  11/02/2015   IR RADIOLOGIST EVAL & MGMT 11/02/2015 Sandi Mariscal, MD [facility]  . IR RADIOLOGIST EVAL & MGMT  04/26/2016  . LEFT HEART CATH AND CORS/GRAFTS ANGIOGRAPHY N/A 01/19/2019   Procedure: LEFT HEART CATH AND CORS/GRAFTS ANGIOGRAPHY;  Surgeon: Belva Crome, MD;  Location: [facility] CV LAB;  Service: Cardiovascular;  Laterality: N/A;    Social History:  reports that she has never smoked. She has  never used smokeless tobacco. She reports that she does not drink alcohol and does not use drugs. Family History:  Family History  Problem Relation Age of Onset  . Diabetes Mother 1       died - HTN  . Stroke Mother   . High blood pressure Mother   . Sudden death Mother   . Obesity Mother   . Other Other        No early family hx of CAD     HOME MEDICATIONS: Allergies as of 02/25/2020      Reactions   Lipitor  [atorvastatin Calcium] Rash   Metformin And Related Itching, Swelling, Other (See Comments)   Leg pain & swelling in legs   Atorvastatin Itching, Rash  Levofloxacin Itching      Medication List       Accurate as of February 25, 2020 10:34 AM. If you have any questions, ask your nurse or doctor.        albuterol 108 (90 Base) MCG/ACT inhaler Commonly known as: VENTOLIN HFA Inhale 2 puffs into the lungs every 6 (six) hours as needed for wheezing or shortness of breath.   albuterol (2.5 MG/3ML) 0.083% nebulizer solution Commonly known as: PROVENTIL Take 2.5 mg by nebulization every 6 (six) hours as needed for wheezing or shortness of breath.   amitriptyline 10 MG tablet Commonly known as: ELAVIL Take 10 mg by mouth daily.   carvedilol 6.25 MG tablet Commonly known as: COREG Take 1 tablet (6.25 mg total) by mouth 2 (two) times daily with a meal.   cyclobenzaprine 5 MG tablet Commonly known as: FLEXERIL Take 5 mg by mouth 3 (three) times daily as needed for muscle spasms.   dapagliflozin propanediol 5 MG Tabs tablet Commonly known as: Farxiga Take 1 tablet (5 mg total) by mouth daily before breakfast.   ferrous sulfate 325 (65 FE) MG EC tablet Take 325 mg by mouth daily.   fluconazole 150 MG tablet Commonly known as: DIFLUCAN Take 150 mg by mouth every 7 (seven) days.   furosemide 40 MG tablet Commonly known as: LASIX Take 1 tablet (40 mg total) by mouth daily.   Insulin Pen Needle 32G X 4 MM Misc 1 Device by Does not apply route in the morning,  at noon, in the evening, and at bedtime.   isosorbide dinitrate 30 MG tablet Commonly known as: ISORDIL Take 30 mg by mouth every morning.   latanoprost 0.005 % ophthalmic solution Commonly known as: XALATAN Place 1 drop into both eyes at bedtime.   lisinopril 2.5 MG tablet Commonly known as: ZESTRIL Take 1 tablet (2.5 mg total) by mouth daily.   meloxicam 15 MG tablet Commonly known as: MOBIC Take 15 mg by mouth daily.   methocarbamol 500 MG tablet Commonly known as: ROBAXIN Take 1 tablet (500 mg total) by mouth 2 (two) times daily as needed for muscle spasms.   MUSCLE RELIEF EX Apply 1 application topically daily. For pain   nitroGLYCERIN 0.4 MG SL tablet Commonly known as: NITROSTAT Place 0.4 mg under the tongue every 5 (five) minutes as needed for chest pain.   NovoLOG FlexPen 100 UNIT/ML FlexPen Generic drug: insulin aspart Inject 6 Units into the skin 3 (three) times daily with meals.   Ozempic (1 MG/DOSE) 2 MG/1.5ML Sopn Generic drug: Semaglutide (1 MG/DOSE) Inject 0.75 mLs (1 mg total) into the skin once a week.   rosuvastatin 20 MG tablet Commonly known as: CRESTOR Take 1 tablet (20 mg total) by mouth daily at 6 PM.   traMADol 50 MG tablet Commonly known as: ULTRAM Take 50 mg by mouth 2 (two) times daily.   Tyler Aas FlexTouch 100 UNIT/ML FlexTouch Pen Generic drug: insulin degludec Inject 0.22 mLs (22 Units total) into the skin daily.   Vitamin D (Ergocalciferol) 1.25 MG (50000 UNIT) Caps capsule Commonly known as: DRISDOL Take 1 capsule (50,000 Units total) by mouth every 7 (seven) days.   Xarelto 20 MG Tabs tablet Generic drug: rivaroxaban Take 20 mg by mouth daily.        OBJECTIVE:   Vital Signs: BP 130/80   Pulse 70   Ht 5\' 3"  (1.6 m)   Wt 227 lb 8 oz (103.2 kg)   SpO2 98%  BMI 40.30 kg/m   Wt Readings from Last 3 Encounters:  02/25/20 227 lb 8 oz (103.2 kg)  01/18/20 229 lb (103.9 kg)  01/13/20 230 lb (104.3 kg)      Exam: General: Pt appears well and is in NAD  Lungs: Clear with good BS bilat with no rales, rhonchi, or wheezes  Heart: RRR with normal S1 and S2 and no gallops; no murmurs; no rub  Extremities: 1+ pretibial edema.  Neuro: MS is good with appropriate affect, pt is alert and Ox3     DM Foot Exam 02/25/2020    The skin of the feet is  without sores or ulcerations, but has left 3rd toe scab as well as scattered ankle scabs The pedal pulses are undetectable  The sensation is decreased to a screening 5.07, 10 gram monofilament bilaterally ( decreased around the toes on the right and decreased mid arch on the left )      DATA REVIEWED:  Lab Results  Component Value Date   HGBA1C 6.1 (A) 02/25/2020   HGBA1C 5.8 (A) 11/19/2019   HGBA1C 6.2 (A) 07/16/2019   Lab Results  Component Value Date   LDLCALC 64 11/03/2018   CREATININE 1.04 (H) 02/16/2020    Lab Results  Component Value Date   CHOL 111 11/03/2018   HDL 30 (L) 11/03/2018   LDLCALC 64 11/03/2018   TRIG 87 11/03/2018   CHOLHDL 3.6 08/05/2017       Results for RYNN, MARKIEWICZ (MRN [id number]) as of 02/25/2020 10:35  Ref. Range 02/16/2020 09:42  Sodium Latest Ref Range: 134 - 144 mmol/L 137  Potassium Latest Ref Range: 3.5 - 5.2 mmol/L 4.4  Chloride Latest Ref Range: 96 - 106 mmol/L 100  CO2 Latest Ref Range: 20 - 29 mmol/L 23  Glucose Latest Ref Range: 65 - 99 mg/dL 180 (H)  BUN Latest Ref Range: 8 - 27 mg/dL 26  Creatinine Latest Ref Range: 0.57 - 1.00 mg/dL 1.04 (H)  Calcium Latest Ref Range: 8.7 - 10.3 mg/dL 9.8  BUN/Creatinine Ratio Latest Ref Range: 12 - 28  25  GFR, Est Non African American Latest Ref Range: >59 mL/min/1.73 55 (L)  GFR, Est African American Latest Ref Range: >59 mL/min/1.73 63   ASSESSMENT / PLAN / RECOMMENDATIONS:   1) Type 2 Diabetes Mellitus, Optimally controlled, With Neuropathic and  macrovascular complications - Most recent A1c of 6.1 %. Goal A1c < 7.0 %.    - A1c continues to be  low despite gradually reducing insulin doses, this has been confirmed with normal fructosamine  In 11/2019 - Will increase Farxiga and stop standing dose of prandial insulin but she will be provided with a correctional scale to be used as needed.  - BMP normal  - A prescription for Dexcom sent to [facility]  -  Aw written prescription was provided for diabetic shoes, will refer to podiatry   MEDICATIONS: - Continue Tresiba at 20 units ONCE  Daily  - Continue Ozempic 1 mg once weekly  - Increase Farxiga 10 mg, 1 tablet with Breakfast  - Correction factor: Novolog ( BG-130/40)  EDUCATION / INSTRUCTIONS:  BG monitoring instructions: Patient is instructed to check her blood sugars 3 times a day, before meals  Call [place] [facility] if: BG persistently < 70 . I reviewed the Rule of 15 for the treatment of hypoglycemia in detail with the patient. Literature supplied.   2) Diabetic complications:   Eye: Does not have known diabetic retinopathy.  Neuro/ Feet: Does  have known diabetic peripheral neuropathy based on foot exam from 07/16/2019  Renal: Patient does not have known baseline CKD. She is not on an ACEI/ARB at present.   F/U in 3 months  I spent 25 minutes preparing to see the patient by review of recent labs, imaging and procedures, obtaining and reviewing separately obtained history, communicating with the patient/family or caregiver, ordering medications, tests or procedures, and documenting clinical information in the EHR including the differential Dx, treatment, and any further evaluation and other management    Signed electronically by: Mack Guise, MD  [facility] Endocrinology  [facility] [address] [place], [place] [address] Phone: [phone] FAX: [phone]   CC: Everardo Beals, NP [place] [address] Phone: [phone]  Fax: [phone]  Return to Endocrinology clinic as below: Future  Appointments  Date Time Provider [place]  04/12/2020  9:20 AM Jerline Pain, MD [facility]  04/25/2020  9:40 AM [facility] DEVICE REMOTES [facility]  07/25/2020  9:40 AM [facility] DEVICE REMOTES [facility]  10/24/2020  9:40 AM [facility] DEVICE REMOTES [facility]

## 2020-02-25 NOTE — Patient Instructions (Addendum)
-   Continue Tresiba at 20 units ONCE  Daily  - Continue Ozempic 1 mg once weekly  - Increase Farxiga 10 mg, 1 tablet with Breakfast  -Novolog correctional insulin: Use the scale below to help guide you:   Blood sugar before meal Number of units to inject  Less than 170 0 unit  171 -  210 2 units  211-  250 3 units  251 -  290 4 units  291 -  330 5 units  331 -  370 6 units      HOW TO TREAT LOW BLOOD SUGARS (Blood sugar LESS THAN 70 MG/DL)  Please follow the RULE OF 15 for the treatment of hypoglycemia treatment (when your (blood sugars are less than 70 mg/dL)    STEP 1: Take 15 grams of carbohydrates when your blood sugar is low, which includes:   3-4 GLUCOSE TABS  OR  3-4 OZ OF JUICE OR REGULAR SODA OR  ONE TUBE OF GLUCOSE GEL     STEP 2: RECHECK blood sugar in 15 MINUTES STEP 3: If your blood sugar is still low at the 15 minute recheck --> then, go back to STEP 1 and treat AGAIN with another 15 grams of carbohydrates.

## 2020-03-01 ENCOUNTER — Encounter (HOSPITAL_COMMUNITY): Payer: Self-pay | Admitting: Emergency Medicine

## 2020-03-01 ENCOUNTER — Other Ambulatory Visit: Payer: Self-pay | Admitting: *Deleted

## 2020-03-01 ENCOUNTER — Emergency Department (HOSPITAL_COMMUNITY)
Admission: EM | Admit: 2020-03-01 | Discharge: 2020-03-01 | Disposition: A | Discharge status: Home / Self Care | Payer: Medicare HMO | Attending: Emergency Medicine | Admitting: Emergency Medicine

## 2020-03-01 ENCOUNTER — Emergency Department (HOSPITAL_BASED_OUTPATIENT_CLINIC_OR_DEPARTMENT_OTHER): Payer: Medicare HMO

## 2020-03-01 ENCOUNTER — Other Ambulatory Visit: Payer: Self-pay

## 2020-03-01 DIAGNOSIS — Z794 Long term (current) use of insulin: Secondary | ICD-10-CM | POA: Insufficient documentation

## 2020-03-01 DIAGNOSIS — Z951 Presence of aortocoronary bypass graft: Secondary | ICD-10-CM | POA: Insufficient documentation

## 2020-03-01 DIAGNOSIS — Z7984 Long term (current) use of oral hypoglycemic drugs: Secondary | ICD-10-CM | POA: Insufficient documentation

## 2020-03-01 DIAGNOSIS — I110 Hypertensive heart disease with heart failure: Secondary | ICD-10-CM | POA: Insufficient documentation

## 2020-03-01 DIAGNOSIS — I82502 Chronic embolism and thrombosis of unspecified deep veins of left lower extremity: Secondary | ICD-10-CM | POA: Diagnosis not present

## 2020-03-01 DIAGNOSIS — I2510 Atherosclerotic heart disease of native coronary artery without angina pectoris: Secondary | ICD-10-CM | POA: Insufficient documentation

## 2020-03-01 DIAGNOSIS — Z85038 Personal history of other malignant neoplasm of large intestine: Secondary | ICD-10-CM | POA: Diagnosis not present

## 2020-03-01 DIAGNOSIS — Z7901 Long term (current) use of anticoagulants: Secondary | ICD-10-CM | POA: Diagnosis not present

## 2020-03-01 DIAGNOSIS — Z79899 Other long term (current) drug therapy: Secondary | ICD-10-CM | POA: Diagnosis not present

## 2020-03-01 DIAGNOSIS — I825Y2 Chronic embolism and thrombosis of unspecified deep veins of left proximal lower extremity: Secondary | ICD-10-CM

## 2020-03-01 DIAGNOSIS — M810 Age-related osteoporosis without current pathological fracture: Secondary | ICD-10-CM

## 2020-03-01 DIAGNOSIS — I5042 Chronic combined systolic (congestive) and diastolic (congestive) heart failure: Secondary | ICD-10-CM | POA: Diagnosis not present

## 2020-03-01 DIAGNOSIS — Z86718 Personal history of other venous thrombosis and embolism: Secondary | ICD-10-CM | POA: Diagnosis not present

## 2020-03-01 DIAGNOSIS — E1142 Type 2 diabetes mellitus with diabetic polyneuropathy: Secondary | ICD-10-CM | POA: Diagnosis not present

## 2020-03-01 DIAGNOSIS — M79605 Pain in left leg: Secondary | ICD-10-CM | POA: Diagnosis present

## 2020-03-01 LAB — CBC
HCT: 37 % (ref 36.0–46.0)
Hemoglobin: 12.2 g/dL (ref 12.0–15.0)
MCH: 34.1 pg — ABNORMAL HIGH (ref 26.0–34.0)
MCHC: 33 g/dL (ref 30.0–36.0)
MCV: 103.4 fL — ABNORMAL HIGH (ref 80.0–100.0)
Platelets: 175 10*3/uL (ref 150–400)
RBC: 3.58 MIL/uL — ABNORMAL LOW (ref 3.87–5.11)
RDW: 15.7 % — ABNORMAL HIGH (ref 11.5–15.5)
WBC: 4.6 10*3/uL (ref 4.0–10.5)
nRBC: 0 % (ref 0.0–0.2)

## 2020-03-01 LAB — BASIC METABOLIC PANEL
Anion gap: 10 (ref 5–15)
BUN: 23 mg/dL (ref 8–23)
CO2: 24 mmol/L (ref 22–32)
Calcium: 9.8 mg/dL (ref 8.9–10.3)
Chloride: 102 mmol/L (ref 98–111)
Creatinine, Ser: 1.59 mg/dL — ABNORMAL HIGH (ref 0.44–1.00)
GFR, Estimated: 35 mL/min — ABNORMAL LOW (ref >60)
Glucose, Bld: 151 mg/dL — ABNORMAL HIGH (ref 70–99)
Potassium: 4.7 mmol/L (ref 3.5–5.1)
Sodium: 136 mmol/L (ref 135–145)

## 2020-03-01 LAB — TROPONIN I (HIGH SENSITIVITY): Troponin I (High Sensitivity): 11 ng/L (ref <18)

## 2020-03-01 MED ORDER — APIXABAN (ELIQUIS) VTE STARTER PACK (10MG AND 5MG): ORAL_TABLET | ORAL | 0 refills | Status: DC

## 2020-03-01 NOTE — ED Notes (Signed)
Pt transported to vascular.  °

## 2020-03-01 NOTE — ED Provider Notes (Signed)
[facility] EMERGENCY DEPARTMENT Provider Note   CSN: [id number] Arrival date & time: 03/01/20  1730     History Chief Complaint  Patient presents with  . DVT    [name] is a 70 y.o. female.  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  Reports symptoms have been worsening over the last several months.  Pain is 8/10, helped by her home tramadol.  Has not had fever. No chest pain or dyspnea. Reports taking her xarelto since her initial diagnosis and reports total adherence without missing doses.   Past Medical History:  Diagnosis Date  . Abnormal LFTs   . AICD (automatic cardioverter/defibrillator) present   . Angina decubitus ([place]) 11/06/2012  . Angina pectoris ([place])   . Anxiety   . Arthritis   . Back pain   . Bundle branch block 05/2016  . CAD (coronary artery disease) 06/18/2010  . CHF (congestive heart failure) ([place])   . Chronic combined systolic and diastolic CHF, NYHA class 3 ([place])   . Chronic combined systolic and diastolic heart failure ([place])   . Chronic systolic dysfunction of left ventricle 09/24/2012  . Coronary artery disease    s/p CABG 2007  . Depression   . Dermal mycosis   . Diabetes mellitus    type II  . DKA (diabetic ketoacidoses) 01/12/2019  . DVT (deep venous thrombosis) ([place]) 09/2015   bilateral  . Edema, lower extremity   . Glaucoma   . Hyperlipidemia   . Hypertension   . Hypoglycemia 09/09/2012  . Insomnia   . Insulin dependent diabetes mellitus ([place]) 04/23/2012  . Ischemic cardiomyopathy    EF 30-35%, s/p ICD 4/08 by Dr  Leonia Reeves  . Joint pain   . LBBB (left bundle branch block) 09/24/2012  . Morbid obesity ([place]) 11/06/2012  . Obesity   . Oxygen dependent 2 L  . Pain in left knee   . Peripheral neuropathy   . 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 Sleep apnea    uses O2 at night  . Tinea   . Type 2 diabetes mellitus with hyperglycemia, with long-term current use of insulin ([place]) 04/20/2019  . Uncontrolled diabetes mellitus ([place]) 02/2019  . Ventricular tachycardia ([place]) 01/16/15   sustained VT terminated with ATP, CL 340 msec  . Vitamin B 12 deficiency 11/05/2018  . Vitamin D deficiency 11/05/2018    Patient Active Problem List   Diagnosis Date Noted  . Aortic atherosclerosis ([place]) 01/13/2020  . Diabetes mellitus with coincident hypertension ([place]) 01/13/2020  . Type 2 diabetes mellitus with diabetic polyneuropathy, with long-term current use of insulin ([place]) 07/16/2019  . Type 2 diabetes mellitus with hyperglycemia, with long-term current use of insulin ([place]) 04/20/2019  . Diabetes mellitus ([place]) 04/20/2019  . Uncontrolled diabetes mellitus ([place]) 02/20/2019  . Pressure injury of skin 02/20/2019  . Demand ischemia ([place])   . Abnormal LFTs   . Acute pancreatitis 01/12/2019  . DKA (diabetic ketoacidoses) 01/12/2019  .  Vitamin D deficiency 11/05/2018  . Vitamin B 12 deficiency 11/05/2018  . Tinea   . Sleep apnea   . Oxygen dependent   . Obesity   . Insomnia   . Hyperlipidemia   . Dermal mycosis   . Depression   . Coronary artery disease   . CHF (congestive heart failure) ([place])   . Arthritis   . Anxiety   . ICD (implantable cardioverter-defibrillator) in place   . Heme positive stool   . Benign neoplasm of transverse colon   . Bundle branch block 05/08/2016  . Left leg pain   . DVT (deep venous thrombosis) ([place]) 09/30/2015  . Syncope 03/11/2015  . AKI (acute kidney injury) ([place]) 03/11/2015  .  Hypotension 03/11/2015  . Syncope and collapse 03/11/2015  . Ventricular tachycardia ([place]) 01/17/2015  . Pain in the chest   . SOB (shortness of breath)   . Thyroid nodule   . CAP (community acquired pneumonia)   . Chest pain 05/26/2014  . Right flank pain 05/26/2014  . Right flank discomfort   . Chronic combined systolic and diastolic heart failure ([place])   . Angina pectoris ([place])   . Morbid obesity ([place]) 11/06/2012  . Angina decubitus ([place]) 11/06/2012  . LBBB (left bundle branch block) 09/24/2012  . Chronic systolic dysfunction of left ventricle 09/24/2012  . Hypoglycemia 09/09/2012  . HLD (hyperlipidemia) 04/23/2012  . Insulin dependent diabetes mellitus ([place]) 04/23/2012  . Acute renal failure ([place]) 03/10/2012  . Ischemic cardiomyopathy 06/18/2010  . CAD (coronary artery disease) 06/18/2010  . Hypertension 06/18/2010  . 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    Past Surgical History:  Procedure Laterality Date  . BI-VENTRICULAR IMPLANTABLE CARDIOVERTER DEFIBRILLATOR UPGRADE N/A 09/25/2012   Procedure: BI-VENTRICULAR IMPLANTABLE CARDIOVERTER DEFIBRILLATOR UPGRADE;  Surgeon: Coralyn Mark, MD;  Location: [facility] CATH LAB;  Service: Cardiovascular;  Laterality: N/A;  . BREAST BIOPSY Right 04/05/2014  . CARDIAC CATHETERIZATION    . CARDIAC DEFIBRILLATOR PLACEMENT  4/08   by Dr Leonia Reeves (MDT)  . COLONOSCOPY WITH PROPOFOL N/A 10/12/2016   Procedure: COLONOSCOPY WITH PROPOFOL;  Surgeon: Doran Stabler, MD;  Location: [facility] ENDOSCOPY;  Service: Gastroenterology;  Laterality: N/A;  . CORONARY ARTERY BYPASS GRAFT  10/05/2005   by Dr Cyndia Bent  . IMPLANTABLE CARDIOVERTER DEFIBRILLATOR IMPLANT  09/25/12   attempt of upgrade to CRT-D unsuccessful due to CS anatomy, SJM Unify Asaura device placed with LV port capped by Dr Rayann Heman  . IR GENERIC HISTORICAL  10/03/2015   IR US GUIDE VASC ACCESS LEFT 10/03/2015 Jacqulynn Cadet, MD [facility]  . IR GENERIC HISTORICAL  10/03/2015   IR VENO/EXT/UNI LEFT 10/03/2015  Jacqulynn Cadet, MD [facility]  . IR GENERIC HISTORICAL  10/03/2015   IR VENOCAVAGRAM IVC 10/03/2015 Jacqulynn Cadet, MD [facility]  . IR GENERIC HISTORICAL  10/03/2015   IR INFUSION THROMBOL VENOUS INITIAL (MS) 10/03/2015 Jacqulynn Cadet, MD [facility]  . IR GENERIC HISTORICAL  10/03/2015   IR US GUIDE VASC ACCESS LEFT 10/03/2015 Jacqulynn Cadet, MD [facility]  . IR GENERIC HISTORICAL  10/04/2015   IR THROMBECT VENO MECH MOD SED 10/04/2015 Sandi Mariscal, MD [facility]  . IR GENERIC HISTORICAL  10/04/2015   IR TRANSCATH PLC STENT 1ST ART NOT LE CV CAR VERT CAR 10/04/2015 Sandi Mariscal, MD [facility]  . IR GENERIC HISTORICAL  10/04/2015   IR THROMB F/U EVAL ART/VEN FINAL DAY (MS) 10/04/2015 Sandi Mariscal, MD [facility]  . IR GENERIC HISTORICAL  11/02/2015   IR  RADIOLOGIST EVAL & MGMT 11/02/2015 Sandi Mariscal, MD [facility]  . IR RADIOLOGIST EVAL & MGMT  04/26/2016  . LEFT HEART CATH AND CORS/GRAFTS ANGIOGRAPHY N/A 01/19/2019   Procedure: LEFT HEART CATH AND CORS/GRAFTS ANGIOGRAPHY;  Surgeon: Belva Crome, MD;  Location: [facility] CV LAB;  Service: Cardiovascular;  Laterality: N/A;     OB History    Gravida  4   Para  4   Term      Preterm      AB      Living        SAB      IAB      Ectopic      Multiple      Live Births              Family History  Problem Relation Age of Onset  . Diabetes Mother 12       died - HTN  . Stroke Mother   . High blood pressure Mother   . Sudden death Mother   . Obesity Mother   . Other Other        No early family hx of CAD    Social History   Tobacco Use  . Smoking status: Never Smoker  . Smokeless tobacco: Never Used  Vaping Use  . Vaping Use: Never used  Substance Use Topics  . Alcohol use: No  . Drug use: No    Home Medications Prior to Admission medications   Medication Sig Start Date End Date Taking? Authorizing Provider  APIXABAN Arne Cleveland) VTE STARTER PACK (10MG  AND 5MG ) Take as directed on  package: start with two-5mg  tablets twice daily for 7 days. On day 8, switch to one-5mg  tablet twice daily. 03/01/20  Yes Gareth Morgan, MD  albuterol (PROVENTIL HFA;VENTOLIN HFA) 108 (90 BASE) MCG/ACT inhaler Inhale 2 puffs into the lungs every 6 (six) hours as needed for wheezing or shortness of breath.    [provider]  albuterol (PROVENTIL) (2.5 MG/3ML) 0.083% nebulizer solution Take 2.5 mg by nebulization every 6 (six) hours as needed for wheezing or shortness of breath.  09/30/15   [provider]  amitriptyline (ELAVIL) 10 MG tablet Take 10 mg by mouth daily. 07/25/16   [provider]  Capsaicin (MUSCLE RELIEF EX) Apply 1 application topically daily. For pain 08/09/16   [provider]  carvedilol (COREG) 6.25 MG tablet Take 1 tablet (6.25 mg total) by mouth 2 (two) times daily with a meal. 02/22/19   Eulogio Bear U, DO  Continuous Blood Gluc Sensor (DEXCOM G6 SENSOR) MISC 1 Device by Does not apply route as directed. 02/25/20   Shamleffer, Melanie Crazier, MD  Continuous Blood Gluc Transmit (DEXCOM G6 TRANSMITTER) MISC 1 Device by Does not apply route as directed. 02/25/20   Shamleffer, Melanie Crazier, MD  cyclobenzaprine (FLEXERIL) 5 MG tablet Take 5 mg by mouth 3 (three) times daily as needed for muscle spasms.    [provider]  dapagliflozin propanediol (FARXIGA) 10 MG TABS tablet Take 1 tablet (10 mg total) by mouth daily. 02/25/20   Shamleffer, Melanie Crazier, MD  ferrous sulfate 325 (65 FE) MG EC tablet Take 325 mg by mouth daily.     [provider]  fluconazole (DIFLUCAN) 150 MG tablet Take 150 mg by mouth every 7 (seven) days.    Millsaps, Joelene Millin, NP  furosemide (LASIX) 40 MG tablet Take 1 tablet (40 mg total) by mouth daily. 01/13/20   Jerline Pain,  MD  insulin degludec (TRESIBA FLEXTOUCH) 100 UNIT/ML FlexTouch Pen Inject 20 Units into the skin daily. 02/25/20   Shamleffer, Melanie Crazier, MD  Insulin Pen Needle 32G X 4 MM MISC  1 Device by Does not apply route in the morning, at noon, in the evening, and at bedtime. 04/20/19   Shamleffer, Melanie Crazier, MD  isosorbide dinitrate (ISORDIL) 30 MG tablet Take 30 mg by mouth every morning.    Millsaps, Kimberly, NP  latanoprost (XALATAN) 0.005 % ophthalmic solution Place 1 drop into both eyes at bedtime.    [provider]  lisinopril (ZESTRIL) 2.5 MG tablet Take 1 tablet (2.5 mg total) by mouth daily. 11/11/19   [name] T, PA-C  methocarbamol (ROBAXIN) 500 MG tablet Take 1 tablet (500 mg total) by mouth 2 (two) times daily as needed for muscle spasms. 02/22/19   Geradine Girt, DO  nitroGLYCERIN (NITROSTAT) 0.4 MG SL tablet Place 0.4 mg under the tongue every 5 (five) minutes as needed for chest pain.    [provider]  NOVOLOG FLEXPEN 100 UNIT/ML FlexPen Inject 6 Units into the skin 3 (three) times daily with meals. 07/16/19   Shamleffer, Melanie Crazier, MD  rosuvastatin (CRESTOR) 20 MG tablet Take 1 tablet (20 mg total) by mouth daily at 6 PM. 10/07/15   Minus Liberty, MD  Semaglutide, 1 MG/DOSE, (OZEMPIC, 1 MG/DOSE,) 2 MG/1.5ML SOPN Inject 0.75 mLs (1 mg total) into the skin once a week. 07/16/19   Shamleffer, Melanie Crazier, MD  traMADol (ULTRAM) 50 MG tablet Take 50 mg by mouth 2 (two) times daily. 11/28/15   [provider]  Vitamin D, Ergocalciferol, (DRISDOL) 1.25 MG (50000 UT) CAPS capsule Take 1 capsule (50,000 Units total) by mouth every 7 (seven) days. 12/30/18   Georgia Lopes, DO    Allergies    Lipitor  [atorvastatin calcium], Metformin and related, Atorvastatin, and Levofloxacin  Review of Systems   Review of Systems  Constitutional: Negative for fever.  Respiratory: Negative for cough and shortness of breath.   Cardiovascular: Positive for leg swelling. Negative for chest pain.  Gastrointestinal: Negative for abdominal pain and vomiting.  Musculoskeletal: Positive for myalgias.    Physical Exam Updated Vital  Signs BP 121/67 (BP Location: Right Arm)   Pulse 65   Temp 98.9 F (37.2 C) (Oral)   Resp 18   SpO2 96%   Physical Exam Vitals and nursing note reviewed.  Constitutional:      General: She is not in acute distress.    Appearance: Normal appearance. She is not ill-appearing, toxic-appearing or diaphoretic.  HENT:     Head: Normocephalic.  Eyes:     Conjunctiva/sclera: Conjunctivae normal.  Cardiovascular:     Rate and Rhythm: Normal rate and regular rhythm.     Pulses: Normal pulses.  Pulmonary:     Effort: Pulmonary effort is normal. No respiratory distress.  Musculoskeletal:        General: No deformity or signs of injury.     Cervical back: No rigidity.  Skin:    General: Skin is warm and dry.     Coloration: Skin is not jaundiced or pale.  Neurological:     General: No focal deficit present.     Mental Status: She is alert and oriented to person, place, and time.     ED Results / Procedures / Treatments   Labs (all labs ordered are listed, but only abnormal results are displayed) Labs Reviewed  BASIC METABOLIC PANEL -  Abnormal; Notable for the following components:      Result Value   Glucose, Bld 151 (*)    Creatinine, Ser 1.59 (*)    GFR, Estimated 35 (*)    All other components within normal limits  CBC - Abnormal; Notable for the following components:   RBC 3.58 (*)    MCV 103.4 (*)    MCH 34.1 (*)    RDW 15.7 (*)    All other components within normal limits  TROPONIN I (HIGH SENSITIVITY)    EKG EKG Interpretation  Date/Time:  Tuesday March 01 2020 17:52:41 EST Ventricular Rate:  72 PR Interval:  180 QRS Duration: 150 QT Interval:  488 QTC Calculation: 534 R Axis:   -37 Text Interpretation: Normal sinus rhythm Left axis deviation Left bundle branch block Abnormal ECG No significant change since last tracing Confirmed by Gareth Morgan [phone]) on 03/01/2020 8:41:37 PM   Radiology VAS [name] LOWER EXTREMITY VENOUS (DVT) (ONLY [facility])  Result  Date: 03/02/2020  Lower Venous DVT Study Indications: DVT and leg pain for 3-4 years. Other Indications: Patient had positive study for DVT at [facility] office earlier                    today. She is also on lifetime blood thinners and has had                    previous DVTs in the past. Limitations: Body habitus and poor ultrasound/tissue interface. Comparison Study: Last exam 04/26/2016 - LLE chronic DVT Performing Technologist: Rogelia Rohrer  Examination Guidelines: A complete evaluation includes B-mode imaging, spectral Doppler, color Doppler, and power Doppler as needed of all accessible portions of each vessel. Bilateral testing is considered an integral part of a complete examination. Limited examinations for reoccurring indications may be performed as noted. The reflux portion of the exam is performed with the patient in reverse Trendelenburg.  +-----+---------------+---------+-----------+----------+--------------+ RIGHTCompressibilityPhasicitySpontaneityPropertiesThrombus Aging +-----+---------------+---------+-----------+----------+--------------+ CFV  Full           Yes      Yes                                 +-----+---------------+---------+-----------+----------+--------------+   +---------+---------------+---------+-----------+----------+-------------------+ LEFT     CompressibilityPhasicitySpontaneityPropertiesThrombus Aging      +---------+---------------+---------+-----------+----------+-------------------+ CFV      Partial        No       Yes                  Chronic             +---------+---------------+---------+-----------+----------+-------------------+ SFJ      Partial                                      Chronic             +---------+---------------+---------+-----------+----------+-------------------+ FV Prox  Partial        Yes      Yes                  Chronic             +---------+---------------+---------+-----------+----------+-------------------+  FV Mid   Partial        Yes      Yes  Chronic             +---------+---------------+---------+-----------+----------+-------------------+ FV DistalPartial        No       Yes                  Chronic - not well                                                        visualized          +---------+---------------+---------+-----------+----------+-------------------+ PFV      Full                                                             +---------+---------------+---------+-----------+----------+-------------------+ POP      Partial        Yes      Yes                  Chronic - not well                                                        visualized          +---------+---------------+---------+-----------+----------+-------------------+ PTV      Full                                                             +---------+---------------+---------+-----------+----------+-------------------+ Soleal                                                Not seen on this                                                          exam                +---------+---------------+---------+-----------+----------+-------------------+     Summary: RIGHT: - No evidence of common femoral vein obstruction.  LEFT: - Findings consistent with chronic deep vein thrombosis involving the left common femoral vein, SF junction, left femoral vein, and left popliteal vein. - No cystic structure found in the popliteal fossa.  *See table(s) above for measurements and observations. Electronically signed by [name] MD on 03/02/2020 at 5:57:49 AM.    Final     Procedures Procedures   Medications Ordered in ED Medications - No data to display  ED Course  I have reviewed the triage vital signs and the nursing notes.  Pertinent labs & imaging results that were  available during my care of the patient were reviewed by me and considered in my medical  decision making (see chart for details).    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  No chest pain or dyspnea to suggest PE.   Did not have access to images or reports when patient presented to ED and DVT study repeated which showed chronic DVT involving the left femoral vein, SF junction, left femoral vein and left popliteal vein.  Discussed hx of DVT on xarelto with Dr. Inda Merlin of Hematology and recommends changing to a different anticoagulant such as eliquis.    Discussed with Dr. Scot Dock of Vascular surgery given history of iliac vein stenting.  As the thrombus appears chronic, he recommends leg elevation, feels it is reasonable for her to change anticoagulation and follow up in the office but does not recommend emergent vascular evaluation or admission.  She has palpable distal pulses, normal cap refill, does not appear consistent with phlegmasia alba/cerulea dolens, has swelling but no signs of compartment syndrome.  Will change anticoagulation from xarelto to eliquis, recommend elevation of leg, continued compression stockings and vascular surgery follow up. Patient discharged in stable condition with understanding of reasons to return.      Final Clinical Impression(s) / ED Diagnoses Final diagnoses:  Chronic deep vein thrombosis (DVT) of proximal vein of left lower extremity ([place])    Rx / DC Orders ED Discharge Orders         Ordered    APIXABAN (ELIQUIS) VTE STARTER PACK (10MG  AND 5MG )        03/01/20 0347           Gareth Morgan, MD 03/02/20 1124

## 2020-03-01 NOTE — CV Procedure (Signed)
LLE venous duplex completed. Preliminary findings given to Deneise Lever, RN and messaged to Dr. Billy Fischer (could not contact by phone)  Results can be found under chart review under CV PROC. 03/01/2020 8:15 PM Etsuko Dierolf RVT, RDMS

## 2020-03-01 NOTE — ED Triage Notes (Signed)
Pt states she had an [name] of L leg today and was told to come to ED for a blood clot.  Reports L leg pain x 3-4 years.

## 2020-03-08 ENCOUNTER — Telehealth: Payer: Self-pay | Admitting: Internal Medicine

## 2020-03-08 NOTE — Telephone Encounter (Signed)
[facility] faxed over a request for pt for a work order and most recent clinical notes for pt to receive a Dexcom G6. They informed that they sent it over on the 24th of February but they are going to resend just in case it got lost. They just ask that we please complete and get back it to them.

## 2020-03-08 NOTE — Telephone Encounter (Signed)
Just we have received the form. Place in [facility] inbox.

## 2020-03-11 ENCOUNTER — Ambulatory Visit (INDEPENDENT_AMBULATORY_CARE_PROVIDER_SITE_OTHER): Payer: Medicare HMO

## 2020-03-11 DIAGNOSIS — I5042 Chronic combined systolic (congestive) and diastolic (congestive) heart failure: Secondary | ICD-10-CM | POA: Diagnosis not present

## 2020-03-11 DIAGNOSIS — Z95810 Presence of automatic (implantable) cardiac defibrillator: Secondary | ICD-10-CM | POA: Diagnosis not present

## 2020-03-11 NOTE — Progress Notes (Signed)
EPIC Encounter for ICM Monitoring  Patient Name: [name] is a 70 y.o. female Date: 03/11/2020 Primary Care Physican: Everardo Beals, NP Primary Cardiologist:Skains/Weaver PA Electrophysiologist: Allred 03/11/2020 Weight: 222lbs  AT/AF Burden: <1% (taking Eliquis)  Spoke with patient and reports feeling well at this time.  Denies fluid symptoms.    CorVue thoracic impedanceclose to normal baseline.  Prescribed:  Furosemide 40 mg take 1 tablet daily  Labs: 03/01/2020 Creatinine 1.59, BUN 23, Potassium 4.7, Sodium 136 02/16/2020 Creatinine 1.04, BUN 26, Potassium 4.4, Sodium 137, GFR 55-63  12/09/2019 Creatinine 1.03, BUN 20, Potassium 4.2, Sodium 140, GFR 56-64 12/02/2019 Creatinine 1.29, BUN 30, Potassium 4.9, Sodium 140, GFR 42-49 11/25/2019 Creatinine0.95, BUN21, Potassium5.2, Sodium138 11/19/2019 Creatinine0.83, BUN18, Potassium4.6, Sodium139, NPY05.11 A complete set of results can be found in Results Review.  Recommendations:No changes and encouraged to call if experiencing any fluid symptoms.  Next ICM clinic phone appointment due:04/11/2020. Next 91 day remote transmission due:04/25/2020.   EP/Cardiology Office Visits:  04/12/2020 with Dr Marlou Porch.   Copy of ICM check sent to Dr.Allred.   3 month ICM trend: 03/10/2020.    1 Year ICM trend:       [name], RN 03/11/2020 8:15 AM

## 2020-03-14 ENCOUNTER — Ambulatory Visit: Payer: Medicare HMO

## 2020-03-14 ENCOUNTER — Other Ambulatory Visit: Payer: Self-pay

## 2020-03-14 ENCOUNTER — Ambulatory Visit: Payer: Medicare HMO | Admitting: Podiatry

## 2020-03-16 ENCOUNTER — Ambulatory Visit (INDEPENDENT_AMBULATORY_CARE_PROVIDER_SITE_OTHER): Payer: Medicare HMO | Admitting: Vascular Surgery

## 2020-03-16 ENCOUNTER — Ambulatory Visit: Payer: Medicare HMO

## 2020-03-16 ENCOUNTER — Encounter: Payer: Self-pay | Admitting: Vascular Surgery

## 2020-03-16 ENCOUNTER — Ambulatory Visit: Payer: Medicare HMO | Admitting: Podiatry

## 2020-03-16 ENCOUNTER — Other Ambulatory Visit: Payer: Self-pay

## 2020-03-16 VITALS — BP 116/73 | HR 65 | Temp 97.5°F | Resp 20 | Ht 63.0 in | Wt 231.0 lb

## 2020-03-16 DIAGNOSIS — I82512 Chronic embolism and thrombosis of left femoral vein: Secondary | ICD-10-CM

## 2020-03-16 NOTE — Progress Notes (Signed)
REASON FOR CONSULT:    Chronic DVT of left lower extremity.  The consult is requested by Dr. Gareth Morgan.   ASSESSMENT & PLAN:   CHRONIC VENOUS INSUFFICIENCY: This patient has CEAP C4 venous disease.  She reportedly had an intervention in the left lower extremity in the past however I am unable to obtain his records.  She has chronic clot in the left femoral vein, common femoral vein, and popliteal vein.  She is on Eliquis.  We have had a long discussion about the importance of leg elevation and the proper positioning for this.  I have encouraged her to continue to wear her compression stockings.  I have encouraged her to avoid prolonged sitting and standing.  We discussed importance of exercise specifically walking and water aerobics.  In addition, we discussed the importance of maintaining a healthy weight as central obesity especially increases lower extremity venous pressure.  Currently I do not see any indication for more aggressive work-up to her chronic DVT of the left lower extremity.  I will be happy to see her back at any time if her symptoms worsen or swelling worsens.  The only other consideration if she fails conservative treatment would be a pneumatic compression device given that she has some underlying lymphedema secondary to her chronic venous insufficiency.  [name], MD Office: [phone]   HPI:   [name] is a pleasant 70 y.o. female, with a history of bilateral lower extremity DVTs.  She tells me that she had an intervention of the left lower extremity 2 to 3 years ago.  I am unable to find records of this although it looks like she may have had an intervention by interventional radiology in the past.  She has been maintained on Eliquis.  She was recently switched from [place].  She is had chronic swelling of both lower extremities but especially on the left side.  He does wear compression stockings.  These are knee-high and I believe they are 15-20.  Her  granddaughter helps put these on for her.  She denies any significant chest pain or shortness of breath.  She denies any previous ulcerations in her lower extremities.  Her past medical history is significant for ischemic cardiomyopathy, coronary artery disease, chronic systolic congestive heart failure, type 2 diabetes, hypertension.  Past Medical History:  Diagnosis Date  . Abnormal LFTs   . AICD (automatic cardioverter/defibrillator) present   . Angina decubitus ([place]) 11/06/2012  . Angina pectoris ([place])   . Anxiety   . Arthritis   . Back pain   . Bundle branch block 05/2016  . CAD (coronary artery disease) 06/18/2010  . CHF (congestive heart failure) ([place])   . Chronic combined systolic and diastolic CHF, NYHA class 3 ([place])   . Chronic combined systolic and diastolic heart failure ([place])   . Chronic systolic dysfunction of left ventricle 09/24/2012  . Coronary artery disease    s/p CABG 2007  . Depression   . Dermal mycosis   . Diabetes mellitus    type II  . DKA (diabetic ketoacidoses) 01/12/2019  . DVT (deep venous thrombosis) ([place]) 09/2015   bilateral  . Edema, lower extremity   . Glaucoma   . Hyperlipidemia   . Hypertension   . Hypoglycemia 09/09/2012  . Insomnia   . Insulin dependent diabetes mellitus ([place]) 04/23/2012  . Ischemic cardiomyopathy    EF 30-35%, s/p ICD 4/08 by Dr Leonia Reeves  . Joint pain   . LBBB (left bundle branch  block) 09/24/2012  . Morbid obesity ([place]) 11/06/2012  . Obesity   . Oxygen dependent 2 L  . Pain in left knee   . Peripheral neuropathy   . 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 Sleep apnea    uses O2 at night  . Tinea   . Type 2 diabetes mellitus with hyperglycemia, with long-term current use of insulin ([place]) 04/20/2019  . Uncontrolled diabetes mellitus ([place]) 02/2019  . Ventricular tachycardia ([place]) 01/16/15   sustained VT terminated with ATP, CL 340 msec   . Vitamin B 12 deficiency 11/05/2018  . Vitamin D deficiency 11/05/2018    Family History  Problem Relation Age of Onset  . Diabetes Mother 53       died - HTN  . Stroke Mother   . High blood pressure Mother   . Sudden death Mother   . Obesity Mother   . Other Other        No early family hx of CAD    SOCIAL HISTORY: Social History   Socioeconomic History  . Marital status: Legally Separated    Spouse name: Not on file  . Number of children: 3  . Years of education: 86  . Highest education level: Not on file  Occupational History  . Occupation: retired    Fish farm manager: UNEMPLOYED  Tobacco Use  . Smoking status: Never Smoker  . Smokeless tobacco: Never Used  Vaping Use  . Vaping Use: Never used  Substance and Sexual Activity  . Alcohol use: No  . Drug use: No  . Sexual activity: Not Currently  Other Topics Concern  . Not on file  Social History Narrative   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   11/04/15 Lives with son    caffeine - coffee, 1-2 cups daily   Social Determinants of Health   Financial Resource Strain: Not on file  Food Insecurity: Not on file  Transportation Needs: Not on file  Physical Activity: Not on file  Stress: Not on file  Social Connections: Not on file  Intimate Partner Violence: Not on file    Allergies  Allergen Reactions  . Lipitor  [Atorvastatin Calcium] Rash  . Metformin And Related Itching, Swelling and Other (See Comments)    Leg pain & swelling in legs  . Atorvastatin Itching and Rash  . Levofloxacin Itching    Current Outpatient Medications  Medication Sig Dispense Refill  . albuterol (PROVENTIL HFA;VENTOLIN HFA) 108 (90 BASE) MCG/ACT inhaler Inhale 2 puffs into the lungs every 6 (six) hours as needed for wheezing or shortness of breath.    Marland Kitchen albuterol (PROVENTIL) (2.5 MG/3ML) 0.083% nebulizer solution Take 2.5 mg by nebulization every 6 (six) hours as needed for wheezing or shortness of  breath.     Marland Kitchen amitriptyline (ELAVIL) 10 MG tablet Take 10 mg by mouth daily.    . APIXABAN (ELIQUIS) VTE STARTER PACK (10MG  AND 5MG ) Take as directed on package: start with two-5mg  tablets twice daily for 7 days. On day 8, switch to one-5mg  tablet twice daily. 1 each 0  . Capsaicin (MUSCLE RELIEF EX) Apply 1 application topically daily. For pain    . carvedilol (COREG) 6.25 MG tablet Take 1 tablet (6.25 mg total) by mouth 2 (two) times daily with a meal. 60 tablet 0  . Continuous Blood Gluc Sensor (DEXCOM G6 SENSOR)  MISC 1 Device by Does not apply route as directed. 9 each 3  . Continuous Blood Gluc Transmit (DEXCOM G6 TRANSMITTER) MISC 1 Device by Does not apply route as directed. 1 each 3  . cyclobenzaprine (FLEXERIL) 5 MG tablet Take 5 mg by mouth 3 (three) times daily as needed for muscle spasms.    . dapagliflozin propanediol (FARXIGA) 10 MG TABS tablet Take 1 tablet (10 mg total) by mouth daily. 90 tablet 3  . ferrous sulfate 325 (65 FE) MG EC tablet Take 325 mg by mouth daily.     . fluconazole (DIFLUCAN) 150 MG tablet Take 150 mg by mouth every 7 (seven) days.    . furosemide (LASIX) 40 MG tablet Take 1 tablet (40 mg total) by mouth daily. 90 tablet 1  . insulin degludec (TRESIBA FLEXTOUCH) 100 UNIT/ML FlexTouch Pen Inject 20 Units into the skin daily. 30 mL 3  . Insulin Pen Needle 32G X 4 MM MISC 1 Device by Does not apply route in the morning, at noon, in the evening, and at bedtime. 400 each 3  . isosorbide dinitrate (ISORDIL) 30 MG tablet Take 30 mg by mouth every morning.    . latanoprost (XALATAN) 0.005 % ophthalmic solution Place 1 drop into both eyes at bedtime.    Marland Kitchen lisinopril (ZESTRIL) 2.5 MG tablet Take 1 tablet (2.5 mg total) by mouth daily. 90 tablet 1  . methocarbamol (ROBAXIN) 500 MG tablet Take 1 tablet (500 mg total) by mouth 2 (two) times daily as needed for muscle spasms.    . nitroGLYCERIN (NITROSTAT) 0.4 MG SL tablet Place 0.4 mg under the tongue every 5 (five) minutes  as needed for chest pain.    Marland Kitchen NOVOLOG FLEXPEN 100 UNIT/ML FlexPen Inject 6 Units into the skin 3 (three) times daily with meals. 30 mL 3  . potassium chloride SA (KLOR-CON) 20 MEQ tablet Take 20 mEq by mouth daily.    . rosuvastatin (CRESTOR) 20 MG tablet Take 1 tablet (20 mg total) by mouth daily at 6 PM. 30 tablet 2  . Semaglutide, 1 MG/DOSE, (OZEMPIC, 1 MG/DOSE,) 2 MG/1.5ML SOPN Inject 0.75 mLs (1 mg total) into the skin once a week. 3 pen 3  . traMADol (ULTRAM) 50 MG tablet Take 50 mg by mouth 2 (two) times daily.    . Vitamin D, Ergocalciferol, (DRISDOL) 1.25 MG (50000 UT) CAPS capsule Take 1 capsule (50,000 Units total) by mouth every 7 (seven) days. 4 capsule 0   No current facility-administered medications for this visit.    REVIEW OF SYSTEMS:  [X]  denotes positive finding, [ ]  denotes negative finding Cardiac  Comments:  Chest pain or chest pressure:    Shortness of breath upon exertion: x   Short of breath when lying flat: x   Irregular heart rhythm:        Vascular    Pain in calf, thigh, or hip brought on by ambulation:    Pain in feet at night that wakes you up from your sleep:     Blood clot in your veins:    Leg swelling:  x       Pulmonary    Oxygen at home:    Productive cough:     Wheezing:         Neurologic    Sudden weakness in arms or legs:     Sudden numbness in arms or legs:     Sudden onset of difficulty speaking or slurred speech:    Temporary  loss of vision in one eye:     Problems with dizziness:         Gastrointestinal    Blood in stool:     Vomited blood:         Genitourinary    Burning when urinating:     Blood in urine:        Psychiatric    Major depression:         Hematologic    Bleeding problems:    Problems with blood clotting too easily:        Skin    Rashes or ulcers:        Constitutional    Fever or chills:     PHYSICAL EXAM:   Vitals:   03/16/20 1050  BP: 116/73  Pulse: 65  Resp: 20  Temp: (!) 97.5 F (36.4  C)  SpO2: 97%  Weight: 104.8 kg  Height: 5\' 3"  (1.6 m)   Body mass index is 40.92 kg/m.  GENERAL: The patient is a well-nourished female, in no acute distress. The vital signs are documented above. CARDIAC: There is a regular rate and rhythm.  VASCULAR: I do not detect carotid bruits. She has a biphasic posterior tibial signal bilaterally.  Cannot obtain dorsalis pedis signals. She has bilateral lower extremity swelling with hyperpigmentation which is more significant on the left side.      PULMONARY: There is good air exchange bilaterally without wheezing or rales. ABDOMEN: Soft and non-tender with normal pitched bowel sounds.  MUSCULOSKELETAL: There are no major deformities or cyanosis. NEUROLOGIC: No focal weakness or paresthesias are detected. SKIN: There are no ulcers or rashes noted. PSYCHIATRIC: The patient has a normal affect.  DATA:    VENOUS DUPLEX: I have reviewed the venous duplex scan that was done on 03/02/2020.  This showed evidence of chronic DVT involving the common femoral vein, femoral vein, and popliteal vein.  There was also chronic clot at the saphenofemoral junction.

## 2020-03-18 ENCOUNTER — Ambulatory Visit (INDEPENDENT_AMBULATORY_CARE_PROVIDER_SITE_OTHER): Payer: Medicare HMO | Admitting: Podiatry

## 2020-03-18 ENCOUNTER — Other Ambulatory Visit: Payer: Self-pay | Admitting: Internal Medicine

## 2020-03-18 ENCOUNTER — Other Ambulatory Visit: Payer: Self-pay

## 2020-03-18 ENCOUNTER — Encounter: Payer: Self-pay | Admitting: Podiatry

## 2020-03-18 DIAGNOSIS — M79674 Pain in right toe(s): Secondary | ICD-10-CM

## 2020-03-18 DIAGNOSIS — M2141 Flat foot [pes planus] (acquired), right foot: Secondary | ICD-10-CM | POA: Diagnosis not present

## 2020-03-18 DIAGNOSIS — J0140 Acute pansinusitis, unspecified: Secondary | ICD-10-CM | POA: Insufficient documentation

## 2020-03-18 DIAGNOSIS — B351 Tinea unguium: Secondary | ICD-10-CM

## 2020-03-18 DIAGNOSIS — E1142 Type 2 diabetes mellitus with diabetic polyneuropathy: Secondary | ICD-10-CM | POA: Insufficient documentation

## 2020-03-18 DIAGNOSIS — E1151 Type 2 diabetes mellitus with diabetic peripheral angiopathy without gangrene: Secondary | ICD-10-CM

## 2020-03-18 DIAGNOSIS — M79675 Pain in left toe(s): Secondary | ICD-10-CM | POA: Diagnosis not present

## 2020-03-18 DIAGNOSIS — E119 Type 2 diabetes mellitus without complications: Secondary | ICD-10-CM | POA: Diagnosis not present

## 2020-03-18 DIAGNOSIS — M2142 Flat foot [pes planus] (acquired), left foot: Secondary | ICD-10-CM

## 2020-03-18 DIAGNOSIS — R8290 Unspecified abnormal findings in urine: Secondary | ICD-10-CM | POA: Insufficient documentation

## 2020-03-18 NOTE — Patient Instructions (Signed)
For normal skin: Moisturize feet once daily; do not apply between toes A.  CeraVe Daily Moisturizing Lotion B.  Vaseline Intensive Care Lotion C.  Lubriderm Lotion D.  [place] Diabetic Foot Lotion E.  Eucerin Intensive Repair Moisturizing Lotion  For extremely dry, cracked feet: moisturize feet once daily; do not apply between toes A. CeraVe Healing Ointment B. Aquaphor Healing Ointment C. Vaseline Petroleum Healing Jelly   If you have problems reaching your feet: apply to feet once daily; do not apply between toes A.  Aquaphor Advanced Therapy Ointment Body Spray B.  Vaseline Intensive Care Spray Lotion Advanced Repair     Diabetes Mellitus and Foot Care Foot care is an important part of your health, especially when you have diabetes. Diabetes may cause you to have problems because of poor blood flow (circulation) to your feet and legs, which can cause your skin to:  Become thinner and drier.  Break more easily.  Heal more slowly.  Peel and crack. You may also have nerve damage (neuropathy) in your legs and feet, causing decreased feeling in them. This means that you may not notice minor injuries to your feet that could lead to more serious problems. Noticing and addressing any potential problems early is the best way to prevent future foot problems. How to care for your feet Foot hygiene  Wash your feet daily with warm water and mild soap. Do not use hot water. Then, pat your feet and the areas between your toes until they are completely dry. Do not soak your feet as this can dry your skin.  Trim your toenails straight across. Do not dig under them or around the cuticle. File the edges of your nails with an emery board or nail file.  Apply a moisturizing lotion or petroleum jelly to the skin on your feet and to dry, brittle toenails. Use lotion that does not contain alcohol and is unscented. Do not apply lotion between your toes.   Shoes and socks  Wear clean socks or  stockings every day. Make sure they are not too tight. Do not wear knee-high stockings since they may decrease blood flow to your legs.  Wear shoes that fit properly and have enough cushioning. Always look in your shoes before you put them on to be sure there are no objects inside.  To break in new shoes, wear them for just a few hours a day. This prevents injuries on your feet. Wounds, scrapes, corns, and calluses  Check your feet daily for blisters, cuts, bruises, sores, and redness. If you cannot see the bottom of your feet, use a mirror or ask someone for help.  Do not cut corns or calluses or try to remove them with medicine.  If you find a minor scrape, cut, or break in the skin on your feet, keep it and the skin around it clean and dry. You may clean these areas with mild soap and water. Do not clean the area with peroxide, alcohol, or iodine.  If you have a wound, scrape, corn, or callus on your foot, look at it several times a day to make sure it is healing and not infected. Check for: ? Redness, swelling, or pain. ? Fluid or blood. ? Warmth. ? Pus or a bad smell.   General tips  Do not cross your legs. This may decrease blood flow to your feet.  Do not use heating pads or hot water bottles on your feet. They may burn your skin. If you  have lost feeling in your feet or legs, you may not know this is happening until it is too late.  Protect your feet from hot and cold by wearing shoes, such as at the beach or on hot pavement.  Schedule a complete foot exam at least once a year (annually) or more often if you have foot problems. Report any cuts, sores, or bruises to your health care provider immediately. Where to find more information  American Diabetes Association: www.diabetes.org  Association of Diabetes Care & Education Specialists: www.diabeteseducator.org Contact a health care provider if:  You have a medical condition that increases your risk of infection and you have  any cuts, sores, or bruises on your feet.  You have an injury that is not healing.  You have redness on your legs or feet.  You feel burning or tingling in your legs or feet.  You have pain or cramps in your legs and feet.  Your legs or feet are numb.  Your feet always feel cold.  You have pain around any toenails. Get help right away if:  You have a wound, scrape, corn, or callus on your foot and: ? You have pain, swelling, or redness that gets worse. ? You have fluid or blood coming from the wound, scrape, corn, or callus. ? Your wound, scrape, corn, or callus feels warm to the touch. ? You have pus or a bad smell coming from the wound, scrape, corn, or callus. ? You have a fever. ? You have a red line going up your leg. Summary  Check your feet every day for blisters, cuts, bruises, sores, and redness.  Apply a moisturizing lotion or petroleum jelly to the skin on your feet and to dry, brittle toenails.  Wear shoes that fit properly and have enough cushioning.  If you have foot problems, report any cuts, sores, or bruises to your health care provider immediately.  Schedule a complete foot exam at least once a year (annually) or more often if you have foot problems. This information is not intended to replace advice given to you by your health care provider. Make sure you discuss any questions you have with your health care provider. Document Revised: 07/16/2019 Document Reviewed: 07/16/2019 Elsevier Patient Education  [place].

## 2020-03-20 ENCOUNTER — Encounter: Payer: Self-pay | Admitting: Podiatry

## 2020-03-20 NOTE — Progress Notes (Signed)
Subjective: [name] presents today referred by Everardo Beals, NP for diabetic foot evaluation.  Patient relates >20 year history of diabetes.  Patient denies history of foot wounds.  Patient denies symptoms of numbness in feet.  Patient has symptoms of tingling in feet.  Patient relates blood glucose was 144 mg/dl this morning.   PCP is Everardo Beals, NP , and last visit was 02/26/2020.  Today, patient c/o of painful, discolored, thick toenails which interfere with daily activities.  Pain is aggravated when wearing enclosed shoe gear.   Past Medical History:  Diagnosis Date  . Abnormal LFTs   . AICD (automatic cardioverter/defibrillator) present   . Angina decubitus ([place]) 11/06/2012  . Angina pectoris ([place])   . Anxiety   . Arthritis   . Back pain   . Bundle branch block 05/2016  . CAD (coronary artery disease) 06/18/2010  . CHF (congestive heart failure) ([place])   . Chronic combined systolic and diastolic CHF, NYHA class 3 ([place])   . Chronic combined systolic and diastolic heart failure ([place])   . Chronic systolic dysfunction of left ventricle 09/24/2012  . Coronary artery disease    s/p CABG 2007  . Depression   . Dermal mycosis   . Diabetes mellitus    type II  . DKA (diabetic ketoacidoses) 01/12/2019  . DVT (deep venous thrombosis) ([place]) 09/2015   bilateral  . Edema, lower extremity   . Glaucoma   . Hyperlipidemia   . Hypertension   . Hypoglycemia 09/09/2012  . Insomnia   . Insulin dependent diabetes mellitus ([place]) 04/23/2012  . Ischemic cardiomyopathy    EF 30-35%, s/p ICD 4/08 by Dr Leonia Reeves  . Joint pain   . LBBB (left bundle branch block) 09/24/2012  . Morbid obesity ([place]) 11/06/2012  . Obesity   . Oxygen dependent 2 L  . Pain in left knee   . Peripheral neuropathy   . 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 Sleep apnea    uses O2 at night  . Tinea   .  Type 2 diabetes mellitus with hyperglycemia, with long-term current use of insulin ([place]) 04/20/2019  . Uncontrolled diabetes mellitus ([place]) 02/2019  . Ventricular tachycardia ([place]) 01/16/15   sustained VT terminated with ATP, CL 340 msec  . Vitamin B 12 deficiency 11/05/2018  . Vitamin D deficiency 11/05/2018    Patient Active Problem List   Diagnosis Date Noted  . Acute pansinusitis 03/18/2020  . Diabetic peripheral neuropathy ([place]) 03/18/2020  . Urine finding 03/18/2020  . Aortic atherosclerosis ([place]) 01/13/2020  . Diabetes mellitus with coincident hypertension ([place]) 01/13/2020  . Type 2 diabetes mellitus with diabetic polyneuropathy, with long-term current use of insulin ([place]) 07/16/2019  . Type 2 diabetes mellitus with hyperglycemia, with long-term current use of insulin ([place]) 04/20/2019  . Diabetes mellitus ([place]) 04/20/2019  . Uncontrolled diabetes mellitus ([place]) 02/20/2019  . Pressure injury of skin 02/20/2019  . Demand ischemia ([place])   . Abnormal LFTs   . Acute pancreatitis 01/12/2019  . DKA (diabetic ketoacidoses) 01/12/2019  . Vitamin D deficiency 11/05/2018  . Vitamin B 12 deficiency 11/05/2018  . Osteoarthritis of knee 07/02/2018  . Tinea   . Sleep apnea   . Oxygen dependent   . Obesity   . Insomnia   . Hyperlipidemia   . Dermal mycosis   . Depression   . Coronary  artery disease   . CHF (congestive heart failure) ([place])   . Arthritis   . Anxiety   . ICD (implantable cardioverter-defibrillator) in place   . Heme positive stool   . Benign neoplasm of transverse colon   . Bundle branch block 05/08/2016  . Left leg pain   . DVT (deep venous thrombosis) ([place]) 09/30/2015  . Syncope 03/11/2015  . AKI (acute kidney injury) ([place]) 03/11/2015  . Hypotension 03/11/2015  . Syncope and collapse 03/11/2015  . Ventricular tachycardia ([place]) 01/17/2015  . Pain in the chest   . SOB (shortness of breath)   . Thyroid nodule   . CAP (community acquired pneumonia)   . Chest pain  05/26/2014  . Right flank pain 05/26/2014  . Right flank discomfort   . Chronic combined systolic and diastolic heart failure ([place])   . Angina pectoris ([place])   . Morbid obesity ([place]) 11/06/2012  . Angina decubitus ([place]) 11/06/2012  . LBBB (left bundle branch block) 09/24/2012  . Chronic systolic dysfunction of left ventricle 09/24/2012  . Hypoglycemia 09/09/2012  . HLD (hyperlipidemia) 04/23/2012  . Insulin dependent diabetes mellitus ([place]) 04/23/2012  . Acute renal failure ([place]) 03/10/2012  . Ischemic cardiomyopathy 06/18/2010  . CAD (coronary artery disease) 06/18/2010  . Hypertension 06/18/2010  . 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    Past Surgical History:  Procedure Laterality Date  . BI-VENTRICULAR IMPLANTABLE CARDIOVERTER DEFIBRILLATOR UPGRADE N/A 09/25/2012   Procedure: BI-VENTRICULAR IMPLANTABLE CARDIOVERTER DEFIBRILLATOR UPGRADE;  Surgeon: Coralyn Mark, MD;  Location: [facility] CATH LAB;  Service: Cardiovascular;  Laterality: N/A;  . BREAST BIOPSY Right 04/05/2014  . CARDIAC CATHETERIZATION    . CARDIAC DEFIBRILLATOR PLACEMENT  4/08   by Dr Leonia Reeves (MDT)  . COLONOSCOPY WITH PROPOFOL N/A 10/12/2016   Procedure: COLONOSCOPY WITH PROPOFOL;  Surgeon: Doran Stabler, MD;  Location: [facility] ENDOSCOPY;  Service: Gastroenterology;  Laterality: N/A;  . CORONARY ARTERY BYPASS GRAFT  10/05/2005   by Dr Cyndia Bent  . IMPLANTABLE CARDIOVERTER DEFIBRILLATOR IMPLANT  09/25/12   attempt of upgrade to CRT-D unsuccessful due to CS anatomy, SJM Unify [place]aura device placed with LV port capped by Dr Rayann Heman  . IR GENERIC HISTORICAL  10/03/2015   IR US GUIDE VASC ACCESS LEFT 10/03/2015 Jacqulynn Cadet, MD [facility]  . IR GENERIC HISTORICAL  10/03/2015   IR VENO/EXT/UNI LEFT 10/03/2015 Jacqulynn Cadet, MD [facility]  . IR GENERIC HISTORICAL  10/03/2015   IR VENOCAVAGRAM IVC 10/03/2015 Jacqulynn Cadet, MD [facility]  . IR GENERIC HISTORICAL  10/03/2015   IR INFUSION THROMBOL VENOUS INITIAL (MS) 10/03/2015 Jacqulynn Cadet, MD [facility]  . IR GENERIC HISTORICAL  10/03/2015   IR US GUIDE VASC ACCESS LEFT 10/03/2015 Jacqulynn Cadet, MD [facility]  . IR GENERIC HISTORICAL  10/04/2015   IR THROMBECT VENO MECH MOD SED 10/04/2015 Sandi Mariscal, MD [facility]  . IR GENERIC HISTORICAL  10/04/2015   IR TRANSCATH PLC STENT 1ST ART NOT LE CV CAR VERT CAR 10/04/2015 Sandi Mariscal, MD [facility]  . IR GENERIC HISTORICAL  10/04/2015   IR THROMB F/U EVAL ART/VEN FINAL DAY (MS) 10/04/2015 Sandi Mariscal, MD [facility]  . IR GENERIC HISTORICAL  11/02/2015   IR RADIOLOGIST EVAL & MGMT 11/02/2015 Sandi Mariscal, MD [facility]  . IR RADIOLOGIST EVAL & MGMT  04/26/2016  . LEFT HEART CATH AND CORS/GRAFTS ANGIOGRAPHY N/A 01/19/2019   Procedure: LEFT HEART CATH AND CORS/GRAFTS ANGIOGRAPHY;  Surgeon: Belva Crome, MD;  Location: [facility]  CV LAB;  Service: Cardiovascular;  Laterality: N/A;    Current Outpatient Medications on File Prior to Visit  Medication Sig Dispense Refill  . albuterol (PROVENTIL HFA;VENTOLIN HFA) 108 (90 BASE) MCG/ACT inhaler Inhale 2 puffs into the lungs every 6 (six) hours as needed for wheezing or shortness of breath.    Marland Kitchen albuterol (PROVENTIL) (2.5 MG/3ML) 0.083% nebulizer solution Take 2.5 mg by nebulization every 6 (six) hours as needed for wheezing or shortness of breath.     Marland Kitchen amitriptyline (ELAVIL) 10 MG tablet Take 10 mg by mouth daily.    . APIXABAN (ELIQUIS) VTE STARTER PACK (10MG  AND 5MG ) Take as directed on package: start with two-5mg  tablets twice daily for 7 days. On day 8, switch to one-5mg  tablet twice daily. 1 each 0  . Capsaicin (MUSCLE RELIEF EX) Apply 1 application topically daily. For pain    . carvedilol (COREG) 6.25 MG tablet Take 1 tablet (6.25 mg total) by mouth 2 (two) times daily with a meal. 60 tablet 0  . Continuous Blood Gluc Sensor (DEXCOM G6 SENSOR) MISC 1 Device by Does not apply route as directed. 9 each 3  . Continuous Blood Gluc Transmit (DEXCOM G6 TRANSMITTER)  MISC 1 Device by Does not apply route as directed. 1 each 3  . cyclobenzaprine (FLEXERIL) 5 MG tablet Take 5 mg by mouth 3 (three) times daily as needed for muscle spasms.    . dapagliflozin propanediol (FARXIGA) 10 MG TABS tablet Take 1 tablet (10 mg total) by mouth daily. 90 tablet 3  . ferrous sulfate 325 (65 FE) MG EC tablet Take 325 mg by mouth daily.     . fluconazole (DIFLUCAN) 150 MG tablet Take 150 mg by mouth every 7 (seven) days.    . furosemide (LASIX) 40 MG tablet Take 1 tablet (40 mg total) by mouth daily. 90 tablet 1  . insulin degludec (TRESIBA FLEXTOUCH) 100 UNIT/ML FlexTouch Pen Inject 20 Units into the skin daily. 30 mL 3  . Insulin Pen Needle 32G X 4 MM MISC 1 Device by Does not apply route in the morning, at noon, in the evening, and at bedtime. 400 each 3  . isosorbide dinitrate (ISORDIL) 30 MG tablet Take 30 mg by mouth every morning.    . latanoprost (XALATAN) 0.005 % ophthalmic solution Place 1 drop into both eyes at bedtime.    Marland Kitchen lisinopril (ZESTRIL) 2.5 MG tablet Take 1 tablet (2.5 mg total) by mouth daily. 90 tablet 1  . methocarbamol (ROBAXIN) 500 MG tablet Take 1 tablet (500 mg total) by mouth 2 (two) times daily as needed for muscle spasms.    . nitroGLYCERIN (NITROSTAT) 0.4 MG SL tablet Place 0.4 mg under the tongue every 5 (five) minutes as needed for chest pain.    Marland Kitchen NOVOLOG FLEXPEN 100 UNIT/ML FlexPen Inject 6 Units into the skin 3 (three) times daily with meals. 30 mL 3  . potassium chloride SA (KLOR-CON) 20 MEQ tablet Take 20 mEq by mouth daily.    . rosuvastatin (CRESTOR) 20 MG tablet Take 1 tablet (20 mg total) by mouth daily at 6 PM. 30 tablet 2  . Semaglutide, 1 MG/DOSE, (OZEMPIC, 1 MG/DOSE,) 2 MG/1.5ML SOPN Inject 0.75 mLs (1 mg total) into the skin once a week. 3 pen 3  . traMADol (ULTRAM) 50 MG tablet Take 50 mg by mouth 2 (two) times daily.    . TRUEplus Lancets 33G MISC Apply 1 each topically 3 (three) times daily.    . Vitamin  D, Ergocalciferol,  (DRISDOL) 1.25 MG (50000 UT) CAPS capsule Take 1 capsule (50,000 Units total) by mouth every 7 (seven) days. 4 capsule 0   No current facility-administered medications on file prior to visit.     Allergies  Allergen Reactions  . Lipitor  [Atorvastatin Calcium] Rash  . Metformin And Related Itching, Swelling and Other (See Comments)    Leg pain & swelling in legs  . Atorvastatin Itching and Rash  . Levofloxacin Itching    Social History   Occupational History  . Occupation: retired    Fish farm manager: UNEMPLOYED  Tobacco Use  . Smoking status: Never Smoker  . Smokeless tobacco: Never Used  Vaping Use  . Vaping Use: Never used  Substance and Sexual Activity  . Alcohol use: No  . Drug use: No  . Sexual activity: Not Currently    Family History  Problem Relation Age of Onset  . Diabetes Mother 67       died - HTN  . Stroke Mother   . High blood pressure Mother   . Sudden death Mother   . Obesity Mother   . Other Other        No early family hx of CAD    Immunization History  Administered Date(s) Administered  . Influenza Inj Mdck Quad Pf 11/16/2016, 12/02/2018  . Influenza Split 02/01/2012  . Influenza, Seasonal, Injecte, Preservative Fre 02/03/2015  . Influenza,inj,Quad PF,6+ Mos 11/19/2013, 02/28/2018  . Influenza,inj,quad, With Preservative 09/30/2015, 10/24/2019  . Influenza-Unspecified 09/30/2015    Objective: There were no vitals filed for this visit.  [name] is a pleasant 70 y.o. female morbidly obese in NAD. AAO X 3.  Vascular Examination: Capillary refill time to digits <4 seconds b/l lower extremities. Nonpalpable DP pulse(s) b/l lower extremities. Nonpalpable PT pulse(s) b/l lower extremities. Pedal hair absent. Lower extremity skin temperature gradient within normal limits. No pain with calf compression b/l. Trace edema noted b/l lower extremities.  Dermatological Examination: Pedal skin with normal turgor, texture and tone bilaterally. No open  wounds bilaterally. No interdigital macerations bilaterally. Toenails 1-5 b/l elongated, discolored, dystrophic, thickened, crumbly with subungual debris and tenderness to dorsal palpation. No hyperkeratotic nor porokeratotic lesions present on today's visit. Pedal skin noted to be dry and flaky b/l lower extremities.  Musculoskeletal Examination: Normal muscle strength 5/5 to all lower extremity muscle groups bilaterally. No pain crepitus or joint limitation noted with ROM b/l. Pes planus deformity noted b/l.   Footwear Assessment: Does the patient wear appropriate shoes? Yes. Does the patient need inserts/orthotics? Yes.  Neurological Examination: Protective sensation decreased with 10 gram monofilament b/l. Vibratory sensation intact b/l.  Lab: Hemoglobin A1C Latest Ref Rng & Units 02/25/2020 11/19/2019 07/16/2019 04/20/2019  HGBA1C 4.0 - 5.6 % 6.1(A) 5.8(A) 6.2(A) 7.7(A)  Some recent data might be hidden   Assessment: 1. Pain due to onychomycosis of toenails of both feet   2. Pes planus of both feet   3. Type II diabetes mellitus with peripheral circulatory disorder (HCC)   4. Encounter for diabetic foot exam ([name])     ADA Risk Categorization: High Risk:  Patient has one or more of the following: Loss of protective sensation Absent pedal pulses Severe Foot deformity History of foot ulcer  Plan: -Examined patient. -Diabetic foot examination performed on today's visit. -Patient to continue soft, supportive shoe gear daily. -Toenails 1-5 b/l were debrided in length and girth with sterile nail nippers and dremel without iatrogenic bleeding.  -Patient to report any pedal  injuries to medical professional immediately. -For dry skin, patient was given written list of OTC moisturizers. Patient/POA instructed to apply to foot/feet once daily avoiding application between toes.  -Patient/POA to call should there be question/concern in the interim.  Return in about 3 months (around  06/18/2020).  Marzetta Board, DPM

## 2020-03-24 ENCOUNTER — Other Ambulatory Visit: Payer: Self-pay

## 2020-03-24 MED ORDER — APIXABAN 5 MG PO TABS: 5.0000 mg | ORAL_TABLET | Freq: Two times a day (BID) | ORAL | 12 refills | Status: DC

## 2020-03-25 ENCOUNTER — Telehealth: Payer: Self-pay | Admitting: Internal Medicine

## 2020-03-25 DIAGNOSIS — E1142 Type 2 diabetes mellitus with diabetic polyneuropathy: Secondary | ICD-10-CM

## 2020-03-25 NOTE — Telephone Encounter (Signed)
Patient called to advise that she received her Dexcom G6 and she would like to get scheduled for the training.  Her number for call back is [phone]

## 2020-03-25 NOTE — Telephone Encounter (Signed)
PLease let the pt know a referral has been sent to [facility] and will be in touch with her.    Thanks

## 2020-03-28 ENCOUNTER — Other Ambulatory Visit: Payer: Self-pay | Admitting: Physician Assistant

## 2020-04-09 ENCOUNTER — Other Ambulatory Visit: Payer: Self-pay

## 2020-04-09 ENCOUNTER — Other Ambulatory Visit: Payer: Medicare HMO | Admitting: Occupational Therapy

## 2020-04-09 NOTE — Patient Outreach (Signed)
Aging Gracefully Program  OT Follow-Up Visit  04/09/2020  Nivea Wojdyla 1950/07/07 [id number]  Visit:  3- Third Visit  Start Time:  9373 End Time:  4287 Total Minutes:  45   Readiness to Change Score :  Readiness to Change Score: 9   Durable Medical Equipment: Adaptive Equipment: Dressing Stick Adaptive Equipment Distribution Date: 04/09/20   Goals:  Goals Addressed            This Visit's Progress   . Patient Stated       Would like to be more independent with her basic ADLs (reacher, sock aid, long sponge, and ted hose donner will help with this, as well as possibly walk in shower stall v. Tub bench) IN PROGRESS  ACTION PLANNING - DRESSING/BATHING Target Problem Area: Difficulty with bathing and dressing  Why Problem May Occur: Difficulty getting to her lower body      Target Goal: Independence with lower body bathing and dressing  STRATEGIES Saving Your Energy: DO: DON'T:  Sit down to do tasks - can sit on toilet Stand too long while dressing  Use appropriate adaptive equipment:  long handled sponge, sock aid, reacher, ted hose donner   Keep all items you'll need within easy reach    Simplifying the way you set up tasks or daily routines: DO: DON'T:  Wear sturdy shoes that grip the floor   Gather all items before getting started   Wear clothing that is easily manipulated Wear loose, flowing clothes   PRACTICE It is important to practice the strategies so we can determine if they will be effective in helping to reach your goal. Follow these specific recommendations: 1.Try and get all lower body clothing on/started with adaptive equipment before standing up  (this saves energy)  2. Always sit to do/start lower body dressing and to bath lower legs and feet  If a strategy does not work the first time, try it again and again (and maybe again). We may make some changes over the next few sessions, based on how they work.  Golden Circle, OTR/L         12/09/2019   ACTION PLANNING Target Problem Area: Unable to don/doff ted hose by herself   Why Problem May Occur: Cannot reach feet easily and ted hose are tight to put on     Target Goal: Independence with doffing and donning ted hose   STRATEGIES Simplifying the way you set up tasks or daily routines: DO: DON'T:  Use dressing stick to take off compression hose as needed   Do use  inside out technique to put on compression hose   Sit while completing tasks above    Practice It is important to practice the strategies so we can determine if they will be effective in helping to reach your goal. Follow these specific recommendations: 1. Keep trying inside out technique for putting on compression hose 2.Keep trying to use dressing stick as needed to take off compression hose  If a strategy does not work the first time, try it again and again (and maybe again). We may make some changes over the next few sessions, based on how they work.   [place] Circle, OTR/L      04/09/2020         Post Clinical Reasoning: Client Action (Goal) Four Interventions: Ms. Ozimek was provided a dressing stick to help take off compression hose--felt this was a more safe option than the reacher due to her fragile skin on her  legs and she has diabetes. She was able to take compression socks off with the dressing stick. Then we worked on getting compression socks back on. She tried a compression sock donner but was unable to achieve the correct angle of donner and leg to make it work correctly, then she tried two different sock aids but they did not work either, so then we tried the 1/2 inside out method and she was able to to them this way. I did put iher left one on for her for now due tp she had so bleeding areas on the left leg that I put bandaids on and wanted to make sure when putting on her left compression sock that she did not shift the bandaids. Did Client Try?: Yes Targeted Problem Area  Status: A Lot Better Clinician View Of Client Situation:: Ms. Dozier is doing well, she reports that she feels well and celebrated her 70th birthday since I last saw her.Her grand-daughter still comes over every 2-3 days to help here with whatever she needs. I encouraged her to keep working on taking off and putting on her right compression hose and let have her grand-daughter assist her with the right one since she has wounds on her left leg (she pulled off some scabs). Client View Of His/Her Situation:: Ms. Spraggins continues to be happy with her ramp and is looking forward to the other home mofifications that are to be completed. Next Visit Plan:: Shower safety once her walk in shower is complete   Golden Circle, OTR/L

## 2020-04-09 NOTE — Patient Instructions (Signed)
Would like to be more independent with her basic ADLs (reacher, sock aid, long sponge, and ted hose donner will help with this, as well as possibly walk in shower stall v. Tub bench) IN PROGRESS  ACTION PLANNING - DRESSING/BATHING Target Problem Area: Difficulty with bathing and dressing  Why Problem May Occur: Difficulty getting to her lower body      Target Goal: Independence with lower body bathing and dressing  STRATEGIES Saving Your Energy: DO: DON'T:  Sit down to do tasks - can sit on toilet Stand too long while dressing  Use appropriate adaptive equipment:  long handled sponge, sock aid, reacher, ted hose donner   Keep all items you'll need within easy reach    Simplifying the way you set up tasks or daily routines: DO: DON'T:  Wear sturdy shoes that grip the floor   Gather all items before getting started   Wear clothing that is easily manipulated Wear loose, flowing clothes   PRACTICE It is important to practice the strategies so we can determine if they will be effective in helping to reach your goal. Follow these specific recommendations: 1.Try and get all lower body clothing on/started with adaptive equipment before standing up  (this saves energy)  2. Always sit to do/start lower body dressing and to bath lower legs and feet  If a strategy does not work the first time, try it again and again (and maybe again). We may make some changes over the next few sessions, based on how they work.  Golden Circle, OTR/L        12/09/2019   ACTION PLANNING Target Problem Area: Unable to don/doff ted hose by herself   Why Problem May Occur: Cannot reach feet easily and ted hose are tight to put on     Target Goal: Independence with doffing and donning ted hose   STRATEGIES Simplifying the way you set up tasks or daily routines: DO: DON'T:  Use dressing stick to take off compression hose as needed   Do use  inside out technique to put on compression hose   Sit  while completing tasks above    Practice It is important to practice the strategies so we can determine if they will be effective in helping to reach your goal. Follow these specific recommendations: 1. Keep trying inside out technique for putting on compression hose 2.Keep trying to use dressing stick as needed to take off compression hose  If a strategy does not work the first time, try it again and again (and maybe again). We may make some changes over the next few sessions, based on how they work.   Golden Circle, OTR/L      04/09/2020

## 2020-04-11 ENCOUNTER — Ambulatory Visit (INDEPENDENT_AMBULATORY_CARE_PROVIDER_SITE_OTHER): Payer: Medicare HMO

## 2020-04-11 ENCOUNTER — Telehealth: Payer: Self-pay | Admitting: Internal Medicine

## 2020-04-11 DIAGNOSIS — I5042 Chronic combined systolic (congestive) and diastolic (congestive) heart failure: Secondary | ICD-10-CM | POA: Diagnosis not present

## 2020-04-11 DIAGNOSIS — Z95810 Presence of automatic (implantable) cardiac defibrillator: Secondary | ICD-10-CM

## 2020-04-11 NOTE — Telephone Encounter (Signed)
Patient states that she is returning a call from Friday, I don't see who called the patient. Please advise.

## 2020-04-11 NOTE — Progress Notes (Signed)
EPIC Encounter for ICM Monitoring  Patient Name: [name] is a 70 y.o. female Date: 04/11/2020 Primary Care Physican: Everardo Beals, NP Primary Cardiologist:Skains/Weaver PA Electrophysiologist: Allred 4/4/2022Weight: 230lbs  AT/AF Burden: <1% (taking Eliquis)  Spoke with patient and reports feeling well at this time.  Denies fluid symptoms.    CorVue thoracic impedanceclose to normal baseline.  Prescribed:  Furosemide 40 mg take 1 tablet daily  Labs: 03/01/2020 Creatinine 1.59, BUN 23, Potassium 4.7, Sodium 136 02/16/2020 Creatinine 1.04, BUN 26, Potassium 4.4, Sodium 137, GFR 55-63  12/09/2019 Creatinine 1.03, BUN 20, Potassium 4.2, Sodium 140, GFR 56-64 12/02/2019 Creatinine 1.29, BUN 30, Potassium 4.9, Sodium 140, GFR 42-49 11/25/2019 Creatinine0.95, BUN21, Potassium5.2, Sodium138 11/19/2019 Creatinine0.83, BUN18, Potassium4.6, Sodium139, ZOX09.60 A complete set of results can be found in Results Review.  Recommendations:No changes and encouraged to call if experiencing any fluid symptoms.  Next ICM clinic phone appointment due:05/09/2020. Next 91 day remote transmission due:04/25/2020.   EP/Cardiology Office Visits: 04/12/2020 with Dr Marlou Porch.   Copy of ICM check sent to Dr.Allred.    3 month ICM trend: 04/11/2020.    1 Year ICM trend:       [name], RN 04/11/2020 3:50 PM

## 2020-04-11 NOTE — Telephone Encounter (Signed)
I am not sure who from our office may have reached out to the pt. I will send to both nurse for Dr. Rayann Heman as well as primary card nurse for Dr. Marlou Porch.

## 2020-04-11 NOTE — Telephone Encounter (Signed)
I haven't called her but it looks like she has an appointment tomorrow with Dr. Marlou Porch so maybe a reminder.

## 2020-04-12 ENCOUNTER — Other Ambulatory Visit: Payer: Self-pay

## 2020-04-12 ENCOUNTER — Encounter: Payer: Self-pay | Admitting: Cardiology

## 2020-04-12 ENCOUNTER — Ambulatory Visit (INDEPENDENT_AMBULATORY_CARE_PROVIDER_SITE_OTHER): Payer: Medicare HMO | Admitting: Cardiology

## 2020-04-12 VITALS — BP 120/70 | HR 56 | Ht 63.0 in | Wt 225.0 lb

## 2020-04-12 DIAGNOSIS — I2510 Atherosclerotic heart disease of native coronary artery without angina pectoris: Secondary | ICD-10-CM

## 2020-04-12 DIAGNOSIS — I519 Heart disease, unspecified: Secondary | ICD-10-CM

## 2020-04-12 LAB — BASIC METABOLIC PANEL
BUN/Creatinine Ratio: 16 (ref 12–28)
BUN: 20 mg/dL (ref 8–27)
CO2: 22 mmol/L (ref 20–29)
Calcium: 9.9 mg/dL (ref 8.7–10.3)
Chloride: 100 mmol/L (ref 96–106)
Creatinine, Ser: 1.29 mg/dL — ABNORMAL HIGH (ref 0.57–1.00)
Glucose: 136 mg/dL — ABNORMAL HIGH (ref 65–99)
Potassium: 5 mmol/L (ref 3.5–5.2)
Sodium: 138 mmol/L (ref 134–144)
eGFR: 45 mL/min/{1.73_m2} — ABNORMAL LOW (ref >59)

## 2020-04-12 NOTE — Patient Instructions (Signed)
Medication Instructions:   Your physician recommends that you continue on your current medications as directed. Please refer to the Current Medication list given to you today.  *If you need a refill on your cardiac medications before your next appointment, please call your pharmacy*   Lab Work:  TODAY--BMET  If you have labs (blood work) drawn today and your tests are completely normal, you will receive your results only by: Marland Kitchen MyChart Message (if you have MyChart) OR . A paper copy in the mail If you have any lab test that is abnormal or we need to change your treatment, we will call you to review the results.   Follow-Up:  4 MONTHS IN THE OFFICE WITH AN EXTENDER

## 2020-04-12 NOTE — Progress Notes (Signed)
Cardiology Office Note:    Date:  04/12/2020   ID:  [name], DOB 19-Jan-1950, MRN [id number]  PCP:  Everardo Beals, NP  [facility] Cardiologist:  Candee Furbish, MD  [facility] Electrophysiologist:  Thompson Grayer, MD   Referring MD: Everardo Beals, NP    History of Present Illness:    [name] is a 70 y.o. female here for the follow-up of chronic systolic heart failure ischemic cardiomyopathy CABG 2007 obstructive sleep apnea diabetes hypertension post ICD CRT (CRT portion unsuccessful due to vein sclerosis)  Had been on Xarelto for anticoagulation given bilateral lower extremity DVT  2 pillows at night. No change from prior. No CP. Has noted 8 pound weight gain and more LE edema (chonic).    Past Medical History:  Diagnosis Date  . Abnormal LFTs   . AICD (automatic cardioverter/defibrillator) present   . Angina decubitus ([place]) 11/06/2012  . Angina pectoris ([place])   . Anxiety   . Arthritis   . Back pain   . Bundle branch block 05/2016  . CAD (coronary artery disease) 06/18/2010  . CHF (congestive heart failure) ([place])   . Chronic combined systolic and diastolic CHF, NYHA class 3 ([place])   . Chronic combined systolic and diastolic heart failure ([place])   . Chronic systolic dysfunction of left ventricle 09/24/2012  . Coronary artery disease    s/p CABG 2007  . Depression   . Dermal mycosis   . Diabetes mellitus    type II  . DKA (diabetic ketoacidoses) 01/12/2019  . DVT (deep venous thrombosis) ([place]) 09/2015   bilateral  . Edema, lower extremity   . Glaucoma   . Hyperlipidemia   . Hypertension   . Hypoglycemia 09/09/2012  . Insomnia   . Insulin dependent diabetes mellitus ([place]) 04/23/2012  . Ischemic cardiomyopathy    EF 30-35%, s/p ICD 4/08 by Dr Leonia Reeves  . Joint pain   . LBBB (left bundle branch block) 09/24/2012  . Morbid obesity ([place]) 11/06/2012  . Obesity   . Oxygen dependent 2 L  . Pain in left knee   . Peripheral neuropathy   . S/P CABG x  5 10/05/2005   LIMA to D2, SVG to ramus intermediate, sequential SVG to OM1-OM2, SVG to RCA with EVH via both legs   . Sleep apnea    uses O2 at night  . Tinea   . Type 2 diabetes mellitus with hyperglycemia, with long-term current use of insulin ([place]) 04/20/2019  . Uncontrolled diabetes mellitus ([place]) 02/2019  . Ventricular tachycardia ([place]) 01/16/15   sustained VT terminated with ATP, CL 340 msec  . Vitamin B 12 deficiency 11/05/2018  . Vitamin D deficiency 11/05/2018    Past Surgical History:  Procedure Laterality Date  . BI-VENTRICULAR IMPLANTABLE CARDIOVERTER DEFIBRILLATOR UPGRADE N/A 09/25/2012   Procedure: BI-VENTRICULAR IMPLANTABLE CARDIOVERTER DEFIBRILLATOR UPGRADE;  Surgeon: Coralyn Danene Montijo, MD;  Location[facility] CATH LAB;  Service: Cardiovascular;  Laterality: N/A;  . BREAST BIOPSY Right 04/05/2014  . CARDIAC CATHETERIZATION    . CARDIAC DEFIBRILLATOR PLACEMENT  4/08   by Dr Leonia Reeves (MDT)  . COLONOSCOPY WITH PROPOFOL N/A 10/12/2016   Procedure: COLONOSCOPY WITH PROPOFOL;  Surgeon: Doran Stabler, MD;  Location: [facility];  Service: Gastroenterology;  Laterality: N/A;  . CORONARY ARTERY BYPASS GRAFT  10/05/2005   by Dr Cyndia Bent  . IMPLANTABLE CARDIOVERTER DEFIBRILLATOR IMPLANT  09/25/12   attempt of upgrade to CRT-D unsuccessful due to CS anatomy, SJM Unify [place] device placed with  LV port capped by Dr Rayann Heman  . IR GENERIC HISTORICAL  10/03/2015   IR US GUIDE VASC ACCESS LEFT 10/03/2015 Jacqulynn Cadet, MD [facility]  . IR GENERIC HISTORICAL  10/03/2015   IR VENO/EXT/UNI LEFT 10/03/2015 Jacqulynn Cadet, MD [facility]  . IR GENERIC HISTORICAL  10/03/2015   IR VENOCAVAGRAM IVC 10/03/2015 Jacqulynn Cadet, MD [facility]  . IR GENERIC HISTORICAL  10/03/2015   IR INFUSION THROMBOL VENOUS INITIAL (MS) 10/03/2015 Jacqulynn Cadet, MD [facility]  . IR GENERIC HISTORICAL  10/03/2015   IR US GUIDE VASC ACCESS LEFT 10/03/2015 Jacqulynn Cadet, MD [facility]  . IR GENERIC  HISTORICAL  10/04/2015   IR THROMBECT VENO MECH MOD SED 10/04/2015 Sandi Mariscal, MD [facility]  . IR GENERIC HISTORICAL  10/04/2015   IR TRANSCATH PLC STENT 1ST ART NOT LE CV CAR VERT CAR 10/04/2015 Sandi Mariscal, MD [facility]  . IR GENERIC HISTORICAL  10/04/2015   IR THROMB F/U EVAL ART/VEN FINAL DAY (MS) 10/04/2015 Sandi Mariscal, MD [facility]  . IR GENERIC HISTORICAL  11/02/2015   IR RADIOLOGIST EVAL & MGMT 11/02/2015 Sandi Mariscal, MD [facility]  . IR RADIOLOGIST EVAL & MGMT  04/26/2016  . LEFT HEART CATH AND CORS/GRAFTS ANGIOGRAPHY N/A 01/19/2019   Procedure: LEFT HEART CATH AND CORS/GRAFTS ANGIOGRAPHY;  Surgeon: Belva Crome, MD;  Location: [place] [facility];  Service: Cardiovascular;  Laterality: N/A;    Current Medications: Current Meds  Medication Sig  . albuterol (PROVENTIL HFA;VENTOLIN HFA) 108 (90 BASE) MCG/ACT inhaler Inhale 2 puffs into the lungs every 6 (six) hours as needed for wheezing or shortness of breath.  Marland Kitchen albuterol (PROVENTIL) (2.5 MG/3ML) 0.083% nebulizer solution Take 2.5 mg by nebulization every 6 (six) hours as needed for wheezing or shortness of breath.   Marland Kitchen amitriptyline (ELAVIL) 10 MG tablet Take 10 mg by mouth daily.  Marland Kitchen apixaban (ELIQUIS) 5 MG TABS tablet Take 1 tablet (5 mg total) by mouth 2 (two) times daily.  . Capsaicin (MUSCLE RELIEF EX) Apply 1 application topically daily. For pain  . carvedilol (COREG) 6.25 MG tablet Take 1 tablet (6.25 mg total) by mouth 2 (two) times daily with a meal.  . Continuous Blood Gluc Sensor (DEXCOM G6 SENSOR) MISC 1 Device by Does not apply route as directed.  . Continuous Blood Gluc Transmit (DEXCOM G6 TRANSMITTER) MISC 1 Device by Does not apply route as directed.  . cyclobenzaprine (FLEXERIL) 5 MG tablet Take 5 mg by mouth 3 (three) times daily as needed for muscle spasms.  . dapagliflozin propanediol (FARXIGA) 10 MG TABS tablet Take 1 tablet (10 mg total) by mouth daily.  . DROPLET PEN NEEDLES 32G X 4 MM MISC USE IN THE  MORNING, AT NOON, IN THE EVENING, AND AT BEDTIME.  . ferrous sulfate 325 (65 FE) MG EC tablet Take 325 mg by mouth daily.   . fluconazole (DIFLUCAN) 150 MG tablet Take 150 mg by mouth every 7 (seven) days.  . furosemide (LASIX) 40 MG tablet Take 1 tablet (40 mg total) by mouth daily.  . insulin degludec (TRESIBA FLEXTOUCH) 100 UNIT/ML FlexTouch Pen Inject 20 Units into the skin daily.  . isosorbide dinitrate (ISORDIL) 30 MG tablet Take 30 mg by mouth every morning.  . latanoprost (XALATAN) 0.005 % ophthalmic solution Place 1 drop into both eyes at bedtime.  Marland Kitchen lisinopril (ZESTRIL) 2.5 MG tablet TAKE 1 TABLET EVERY DAY  . methocarbamol (ROBAXIN) 500 MG tablet Take 1 tablet (500 mg total)  by mouth 2 (two) times daily as needed for muscle spasms.  . nitroGLYCERIN (NITROSTAT) 0.4 MG SL tablet Place 0.4 mg under the tongue every 5 (five) minutes as needed for chest pain.  Marland Kitchen NOVOLOG FLEXPEN 100 UNIT/ML FlexPen Inject 6 Units into the skin 3 (three) times daily with meals.  . potassium chloride SA (KLOR-CON) 20 MEQ tablet Take 20 mEq by mouth daily.  . rosuvastatin (CRESTOR) 20 MG tablet Take 1 tablet (20 mg total) by mouth daily at 6 PM.  . Semaglutide, 1 MG/DOSE, (OZEMPIC, 1 MG/DOSE,) 2 MG/1.5ML SOPN Inject 0.75 mLs (1 mg total) into the skin once a week.  . traMADol (ULTRAM) 50 MG tablet Take 50 mg by mouth 2 (two) times daily.  . TRUEplus Lancets 33G MISC Apply 1 each topically 3 (three) times daily.  . Vitamin D, Ergocalciferol, (DRISDOL) 1.25 MG (50000 UT) CAPS capsule Take 1 capsule (50,000 Units total) by mouth every 7 (seven) days.     Allergies:   Lipitor  [atorvastatin calcium], Metformin and related, Atorvastatin, and Levofloxacin   Social History   Socioeconomic History  . Marital status: Legally Separated    Spouse name: Not on file  . Number of children: 3  . Years of education: 22  . Highest education level: Not on file  Occupational History  . Occupation: retired    Fish farm manager:  UNEMPLOYED  Tobacco Use  . Smoking status: Never Smoker  . Smokeless tobacco: Never Used  Vaping Use  . Vaping Use: Never used  Substance and Sexual Activity  . Alcohol use: No  . Drug use: No  . Sexual activity: Not Currently  Other Topics Concern  . Not on file  Social History Narrative   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   11/04/15 Lives with son    caffeine - coffee, 1-2 cups daily   Social Determinants of Health   Financial Resource Strain: Not on file  Food Insecurity: Not on file  Transportation Needs: Not on file  Physical Activity: Not on file  Stress: Not on file  Social Connections: Not on file     Family History: The patient's family history includes Diabetes (age of onset: 61) in her mother; High blood pressure in her mother; Obesity in her mother; Other in an other family member; Stroke in her mother; Sudden death in her mother.  ROS:   Please see the history of present illness.     All other systems reviewed and are negative.  EKGs/Labs/Other Studies Reviewed:    The following studies were reviewed today:   Cardiac catheterization 01/19/2019: catheterization on 01/19/2019:  Severe diffuse three-vessel coronary disease with functional total occlusion of the mid LAD, total occlusion of the large first diagonal, total occlusion of the proximal ramus intermedius, total occlusion of the proximal circumflex, and total occlusion of the mid RCA. All vessels are heavily calcified.  Patent saphenous vein graft to the PDA. Moderate diffuse disease beyond the graft insertion site  Patent sequential saphenous vein graft to the first and second obtuse marginal. Diffuse native vessel disease beyond the bypass graft insertion site.  Patent saphenous vein graft to the ramus intermedius. Diffuse native vessel disease beyond the graft insertion site.  Patent left internal mammary graft to the dominant diagonal. Diffuse native vessel  disease beyond the graft insertion site.  Chronic systolic heart failure with EF less than 35% and LVEDP 22 mmHg.  RECOMMENDATIONS:   Guideline directed therapy for systolic heart failure.  Diagnostic Dominance: Right      Last echo 01/2019 challenging images-EF perhaps 50%.  Cardiac catheterization around that time however EF estimated 35%.   Recent Labs: 03/01/2020: BUN 23; Creatinine, Ser 1.59; Hemoglobin 12.2; Platelets 175; Potassium 4.7; Sodium 136  Recent Lipid Panel    Component Value Date/Time   CHOL 111 11/03/2018 1205   CHOL 90 12/22/2012 1042   TRIG 87 11/03/2018 1205   TRIG 85 12/22/2012 1042   HDL 30 (L) 11/03/2018 1205   HDL 33 (L) 12/22/2012 1042   CHOLHDL 3.6 08/05/2017 1048   CHOLHDL 4.2 04/25/2012 0520   VLDL 23 04/25/2012 0520   LDLCALC 64 11/03/2018 1205   LDLCALC 40 12/22/2012 1042      Physical Exam:    VS:  BP 120/70 (BP Location: Right Arm, Patient Position: Sitting, Cuff Size: Normal)   Pulse (!) 56   Ht 5\' 3"  (1.6 m)   Wt 225 lb (102.1 kg)   SpO2 94%   BMI 39.86 kg/m     Wt Readings from Last 3 Encounters:  04/12/20 225 lb (102.1 kg)  03/16/20 231 lb (104.8 kg)  02/25/20 227 lb 8 oz (103.2 kg)     GEN:  Well nourished, well developed in no acute distress HEENT: Normal NECK: No JVD; No carotid bruits LYMPHATICS: No lymphadenopathy CARDIAC: RRR, no murmurs, rubs, gallops RESPIRATORY:  Clear to auscultation without rales, wheezing or rhonchi  ABDOMEN: Soft, non-tender, non-distended MUSCULOSKELETAL:  No edema; No deformity  SKIN: Warm and dry NEUROLOGIC:  Alert and oriented x 3 PSYCHIATRIC:  Normal affect   ASSESSMENT:    1. Coronary artery disease involving native coronary artery of native heart without angina pectoris   2. Chronic systolic dysfunction of left ventricle    PLAN:    In order of problems listed above:  Chronic systolic heart failure secondary to ischemic cardiomyopathy -ICD in place, most recent  [place] thoracic impedance was normal -Continue with goal-directed medical therapy. -Prior hospitalization with DKA proved challenging due to hypotension and acute kidney injury. -Previously and 12/02/2019 after [name] saw, creatinine had increased, potassium was normal.  Plan was to stop spironolactone hold lisinopril for 3 days then resume and basic metabolic profile in 1 week. -Continue with Lasix 40 mg once a day.  We will check a basic metabolic profile again.     ICD -Had 1 discharge/shock in the setting of polymorphic VT when she was admitted for gallstone pancreatitis 1/21  Prior DVT -On Eliquis chronically for prevention.  No evidence of bleeding.  Continue to monitor hemoglobin closely. -Had prior thrombolysis with intravascular TPA stenting to left common iliac vein and external iliac vein -No aspirin secondary to Eliquis.  Appreciated her seen Dr. Scot Dock with vascular surgery.  Fairly chronic however.  Conservative measures.  If kidney function/creatinine remains reasonable, I will likely continue with daily Lasix.  Diabetes with hypertension -Has been difficult to control, on insulin.  Followed closely by PCP.  Morbid obesity -Continue to encourage weight loss, decrease carbohydrates.  Aortic atherosclerosis -Not on aspirin secondary to Eliquis use to reduce bleeding. -Continue with Crestor 20.  Checking basic metabolic profile today.  Creatinine has been ranging from 1.29-1.59 31-month follow-up with APP      Medication Adjustments/Labs and Tests Ordered: Current medicines are reviewed at length with the patient today.  Concerns regarding medicines are outlined above.  Orders Placed This Encounter  Procedures  . Basic metabolic panel   No orders of the defined types were  placed in this encounter.   Patient Instructions  Medication Instructions:   Your physician recommends that you continue on your current medications as directed. Please refer to the  Current Medication list given to you today.  *If you need a refill on your cardiac medications before your next appointment, please call your pharmacy*   Lab Work:  TODAY--BMET  If you have labs (blood work) drawn today and your tests are completely normal, you will receive your results only by: Marland Kitchen MyChart Message (if you have MyChart) OR . A paper copy in the mail If you have any lab test that is abnormal or we need to change your treatment, we will call you to review the results.   Follow-Up:  4 MONTHS IN THE OFFICE WITH AN EXTENDER      Signed, Candee Furbish, MD  04/12/2020 10:06 AM    [place]

## 2020-04-13 NOTE — Telephone Encounter (Signed)
Pt was seen in the office by Dr Marlou Porch 04/12/2020.  Any questions were answered at that time.

## 2020-04-15 ENCOUNTER — Other Ambulatory Visit: Payer: Self-pay | Admitting: Internal Medicine

## 2020-04-19 ENCOUNTER — Telehealth: Payer: Self-pay | Admitting: *Deleted

## 2020-04-19 ENCOUNTER — Telehealth: Payer: Self-pay | Admitting: Cardiology

## 2020-04-19 NOTE — Telephone Encounter (Signed)
Attempted to contact pt regarding her recent lab results.  Received VM - LM of stable lab results and no changes needed in her care based on these results.  Requested she c/b if further questions/concerns.

## 2020-04-19 NOTE — Telephone Encounter (Signed)
Pt called back to discuss her lab results from last week.  She reports her phone has been messed up and she has not been able to listen to her messages.  Reviewed lab results 04/12/2020 with pt and advised per Dr Marlou Porch they are stable with no changes needed to her care.  She states understanding and had no further questions at the time of the call.

## 2020-04-19 NOTE — Telephone Encounter (Signed)
Follow Up:     Pt would like her lab results from last week please.

## 2020-04-25 ENCOUNTER — Ambulatory Visit (INDEPENDENT_AMBULATORY_CARE_PROVIDER_SITE_OTHER): Payer: Medicare HMO | Admitting: Podiatry

## 2020-04-25 ENCOUNTER — Other Ambulatory Visit: Payer: Self-pay

## 2020-04-25 ENCOUNTER — Ambulatory Visit (INDEPENDENT_AMBULATORY_CARE_PROVIDER_SITE_OTHER): Payer: Medicare HMO

## 2020-04-25 DIAGNOSIS — M2142 Flat foot [pes planus] (acquired), left foot: Secondary | ICD-10-CM

## 2020-04-25 DIAGNOSIS — E1151 Type 2 diabetes mellitus with diabetic peripheral angiopathy without gangrene: Secondary | ICD-10-CM

## 2020-04-25 DIAGNOSIS — M2141 Flat foot [pes planus] (acquired), right foot: Secondary | ICD-10-CM

## 2020-04-25 DIAGNOSIS — I5042 Chronic combined systolic (congestive) and diastolic (congestive) heart failure: Secondary | ICD-10-CM | POA: Diagnosis not present

## 2020-04-25 NOTE — Progress Notes (Signed)
Patient presented for foam casting for 3 pair custom diabetic shoe inserts. Patient is measured with a [place] Device to be a size 9 wide.  Diabetic shoes are chosen from the safe step catalog. The shoes chosen are [id number]  The patient will be contacted when the shoes and inserts are ready to be picked up.

## 2020-04-27 LAB — CUP PACEART REMOTE DEVICE CHECK
Battery Remaining Longevity: 30 mo
Battery Remaining Percentage: 28 %
Battery Voltage: 2.84 V
Brady Statistic AP VP Percent: 1 %
Brady Statistic AP VS Percent: 1 %
Brady Statistic AS VP Percent: 1 %
Brady Statistic AS VS Percent: 99 %
Brady Statistic RA Percent Paced: 1 %
Brady Statistic RV Percent Paced: 1 %
Date Time Interrogation Session: 20220418100656
HighPow Impedance: 41 Ohm
HighPow Impedance: 42 Ohm
Implantable Lead Implant Date: 20080423
Implantable Lead Implant Date: 20080423
Implantable Lead Implant Date: 20140918
Implantable Lead Location: 753858
Implantable Lead Location: 753859
Implantable Lead Location: 753860
Implantable Lead Model: 5076
Implantable Lead Model: 6947
Implantable Pulse Generator Implant Date: 20140918
Lead Channel Impedance Value: 440 Ohm
Lead Channel Impedance Value: 480 Ohm
Lead Channel Pacing Threshold Amplitude: 0.5 V
Lead Channel Pacing Threshold Amplitude: 0.5 V
Lead Channel Pacing Threshold Pulse Width: 0.5 ms
Lead Channel Pacing Threshold Pulse Width: 0.5 ms
Lead Channel Sensing Intrinsic Amplitude: 1.4 mV
Lead Channel Sensing Intrinsic Amplitude: 12 mV
Lead Channel Setting Pacing Amplitude: 2 V
Lead Channel Setting Pacing Amplitude: 2.5 V
Lead Channel Setting Pacing Pulse Width: 0.5 ms
Lead Channel Setting Sensing Sensitivity: 0.5 mV
Pulse Gen Serial Number: 7070007

## 2020-05-05 ENCOUNTER — Other Ambulatory Visit: Payer: Self-pay

## 2020-05-05 ENCOUNTER — Encounter: Payer: Medicare HMO | Attending: Internal Medicine | Admitting: Dietician

## 2020-05-05 DIAGNOSIS — Z794 Long term (current) use of insulin: Secondary | ICD-10-CM | POA: Diagnosis present

## 2020-05-05 DIAGNOSIS — E1159 Type 2 diabetes mellitus with other circulatory complications: Secondary | ICD-10-CM | POA: Diagnosis not present

## 2020-05-05 NOTE — Progress Notes (Signed)
  Dexcom G6 Personal CGM Training  Start time:1530    End time: 1600 Total time: [place] was educated about the following:  -Getting to know device    (Receive as patient does not know her password to her app store) -Setting up device (high alert  250  , low alert 70  ) -Setting alert profile -Inserting sensor (  Patient completed without issues) -Calibrating- none required for G6 -Ending sensor session -Trouble shooting -Tape guide, clarity information  -Reviewed insulin dosing from dexcom. Avoid high doses of vitamin C and tylenol. Treatment for hyperglycemia and hypoglycemia. Patient was entered into the system.  Share data code provided. [facility] which will expire in 1 month.  Patient has [facility] tech support and my contact information.  Darrol Jump, RD 05/05/2020 4:14 PM.

## 2020-05-10 NOTE — Progress Notes (Signed)
Remote ICD transmission.   

## 2020-05-13 ENCOUNTER — Ambulatory Visit (INDEPENDENT_AMBULATORY_CARE_PROVIDER_SITE_OTHER): Payer: Medicare HMO

## 2020-05-13 DIAGNOSIS — Z95810 Presence of automatic (implantable) cardiac defibrillator: Secondary | ICD-10-CM

## 2020-05-13 DIAGNOSIS — I5042 Chronic combined systolic (congestive) and diastolic (congestive) heart failure: Secondary | ICD-10-CM

## 2020-05-13 NOTE — Progress Notes (Signed)
EPIC Encounter for ICM Monitoring  Patient Name: [name] is a 70 y.o. female Date: 05/13/2020 Primary Care Physican: Everardo Beals, NP Primary Cardiologist:Skains/Weaver PA Electrophysiologist: Allred 4/4/2022Weight: 230lbs  AT/AF Burden: <1% (takingEliquis)  Spoke with patient and reports feeling well at this time. Denies fluid symptoms.   CorVue thoracic impedanceclose tonormalbaseline.  Prescribed:  Furosemide 40 mg take 1 tablet daily  Labs: 04/12/2020 Creatinine 1.29, BUN 20 Potassium 5.0, Sodium 138 03/01/2020 Creatinine1.59, BUN23, Potassium4.7, Sodium136 02/16/2020 Creatinine1.04, BUN26, Potassium4.4, Sodium137, [id number] A complete set of results can be found in Results Review.  Recommendations:No changes and encouraged to call if experiencing any fluid symptoms.  Next ICM clinic phone appointment due:06/20/2020. Next 91 day remote transmission due:07/25/2020.   EP/Cardiology Office Visits: Recall 01/16/2021 with Tommye Standard, PA.  Copy of ICM check sent to Dr.Allred.   3 month ICM trend: 05/12/2020.    1 Year ICM trend:       [name], RN 05/13/2020 3:14 PM

## 2020-05-24 ENCOUNTER — Other Ambulatory Visit: Payer: Self-pay | Admitting: Cardiology

## 2020-05-24 DIAGNOSIS — I472 Ventricular tachycardia, unspecified: Secondary | ICD-10-CM

## 2020-05-24 DIAGNOSIS — E119 Type 2 diabetes mellitus without complications: Secondary | ICD-10-CM

## 2020-05-24 DIAGNOSIS — I2510 Atherosclerotic heart disease of native coronary artery without angina pectoris: Secondary | ICD-10-CM

## 2020-05-24 DIAGNOSIS — I10 Essential (primary) hypertension: Secondary | ICD-10-CM

## 2020-05-24 DIAGNOSIS — I700 Atherosclerosis of aorta: Secondary | ICD-10-CM

## 2020-05-24 DIAGNOSIS — Z95810 Presence of automatic (implantable) cardiac defibrillator: Secondary | ICD-10-CM

## 2020-05-24 DIAGNOSIS — I5042 Chronic combined systolic (congestive) and diastolic (congestive) heart failure: Secondary | ICD-10-CM

## 2020-05-26 ENCOUNTER — Encounter: Payer: Self-pay | Admitting: Internal Medicine

## 2020-05-26 ENCOUNTER — Other Ambulatory Visit: Payer: Self-pay

## 2020-05-26 ENCOUNTER — Ambulatory Visit (INDEPENDENT_AMBULATORY_CARE_PROVIDER_SITE_OTHER): Payer: Medicare HMO | Admitting: Internal Medicine

## 2020-05-26 VITALS — BP 126/82 | HR 62 | Ht 63.0 in | Wt 223.4 lb

## 2020-05-26 DIAGNOSIS — E1159 Type 2 diabetes mellitus with other circulatory complications: Secondary | ICD-10-CM

## 2020-05-26 DIAGNOSIS — E1165 Type 2 diabetes mellitus with hyperglycemia: Secondary | ICD-10-CM

## 2020-05-26 DIAGNOSIS — E1142 Type 2 diabetes mellitus with diabetic polyneuropathy: Secondary | ICD-10-CM

## 2020-05-26 DIAGNOSIS — Z794 Long term (current) use of insulin: Secondary | ICD-10-CM

## 2020-05-26 LAB — POCT GLUCOSE (DEVICE FOR HOME USE): POC Glucose: 113 mg/dl — AB (ref 70–99)

## 2020-05-26 LAB — POCT GLYCOSYLATED HEMOGLOBIN (HGB A1C): Hemoglobin A1C: 7 % — AB (ref 4.0–5.6)

## 2020-05-26 MED ORDER — TRESIBA FLEXTOUCH 100 UNIT/ML ~~LOC~~ SOPN: 24.0000 [IU] | PEN_INJECTOR | Freq: Every day | SUBCUTANEOUS | 3 refills | Status: DC

## 2020-05-26 NOTE — Patient Instructions (Addendum)
-   Increase  Tresiba to 24 units ONCE  Daily  - Continue Ozempic 1 mg once weekly  - Continue Farxiga 10 mg, 1 tablet with Breakfast  -Novolog correctional insulin: Use the scale below to help guide you:   Blood sugar before meal Number of units to inject  Less than 170 0 unit  171 -  210 2 units  211-  250 3 units  251 -  290 4 units  291 -  330 5 units  331 -  370 6 units      HOW TO TREAT LOW BLOOD SUGARS (Blood sugar LESS THAN 70 MG/DL)  Please follow the RULE OF 15 for the treatment of hypoglycemia treatment (when your (blood sugars are less than 70 mg/dL)    STEP 1: Take 15 grams of carbohydrates when your blood sugar is low, which includes:   3-4 GLUCOSE TABS  OR  3-4 OZ OF JUICE OR REGULAR SODA OR  ONE TUBE OF GLUCOSE GEL     STEP 2: RECHECK blood sugar in 15 MINUTES STEP 3: If your blood sugar is still low at the 15 minute recheck --> then, go back to STEP 1 and treat AGAIN with another 15 grams of carbohydrates.

## 2020-05-26 NOTE — Progress Notes (Signed)
Name: [name]  Age/ Sex: 70 y.o., female   MRN/ DOB: [id number], [birth year]     PCP: Everardo Beals, NP   Reason for Endocrinology Evaluation: Type 2 Diabetes Mellitus  Initial Endocrine Consultative Visit: 04/20/2019    PATIENT IDENTIFIER: [name] is a 70 y.o. female with a past medical history of DM, HTN, Dyslipidemia and CAD.. The patient has followed with Endocrinology clinic since 04/20/2019 for consultative assistance with management of her diabetes.  DIABETIC HISTORY:  [name] was diagnosed with T2DM many years ago, she is intolerant to Metformin, has been on Glipizide without reported intolerance. Her hemoglobin A1c has ranged from 7.8% in 2017, peaking at 9.5% in 2014.  On her initial visit to our clinic she had an A1c of 7.7%  , she was on MDI regimen and Ozempic. Marland Kitchen We switched levemir to tresiba and continued Ozempic and Novolog.    farxiga started 11/2019 SUBJECTIVE:   During the last visit (11/19/2019): A1c 5.8%, We reduced insulin dose and continued Ozempic and started [place]      Today (05/27/2020): [name] is here for a follow up on diabetes.  She checks her blood sugar 3 times daily, preprandial. The patient has had hypoglycemic episodes since the last clinic visit, which typically occur 1 x /month  - most often occuring during the day. The patient is  symptomatic with these episodes.  Denies nausea and diarrhea   No recent UTI  Denies nausea or vomiting    HOME DIABETES REGIMEN:  Tresiba  20 units ONCE  Daily  Ozempic 1 mg once weekly  Farxiga 10 mg daily  CF: Novolog (BG-130/40)     Statin: Yes ACE-I/ARB: No      GLUCOSE LOG 67- 255 mg/dL     DIABETIC COMPLICATIONS: Microvascular complications:   Neuropathy  Denies: CKD, retinopathy   Last Eye Exam: Completed 2019  Macrovascular complications:   CAD, CHF  Denies: CVA, PVD   HISTORY:  Past Medical History:  Past Medical History:  Diagnosis Date   . Abnormal LFTs   . AICD (automatic cardioverter/defibrillator) present   . Angina decubitus ([place]) 11/06/2012  . Angina pectoris ([place])   . Anxiety   . Arthritis   . Back pain   . Bundle branch block 05/2016  . CAD (coronary artery disease) 06/18/2010  . CHF (congestive heart failure) ([place])   . Chronic combined systolic and diastolic CHF, NYHA class 3 ([place])   . Chronic combined systolic and diastolic heart failure ([place])   . Chronic systolic dysfunction of left ventricle 09/24/2012  . Coronary artery disease    s/p CABG 2007  . Depression   . Dermal mycosis   . Diabetes mellitus    type II  . DKA (diabetic ketoacidoses) 01/12/2019  . DVT (deep venous thrombosis) ([place]) 09/2015   bilateral  . Edema, lower extremity   . Glaucoma   . Hyperlipidemia   . Hypertension   . Hypoglycemia 09/09/2012  . Insomnia   . Insulin dependent diabetes mellitus ([place]) 04/23/2012  . Ischemic cardiomyopathy    EF 30-35%, s/p ICD 4/08 by Dr Leonia Reeves  . Joint pain   . LBBB (left bundle branch block) 09/24/2012  . Morbid obesity ([place]) 11/06/2012  . Obesity   . Oxygen dependent 2 L  . Pain in left knee   . Peripheral neuropathy   . 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   LIMA to D2, SVG to ramus intermediate, sequential SVG to OM1-OM2, SVG to  RCA with EVH via both legs   . Sleep apnea    uses O2 at night  . Tinea   . Type 2 diabetes mellitus with hyperglycemia, with long-term current use of insulin ([place]) 04/20/2019  . Uncontrolled diabetes mellitus ([place]) 02/2019  . Ventricular tachycardia ([place]) 01/16/15   sustained VT terminated with ATP, CL 340 msec  . Vitamin B 12 deficiency 11/05/2018  . Vitamin D deficiency 11/05/2018   Past Surgical History:  Past Surgical History:  Procedure Laterality Date  . BI-VENTRICULAR IMPLANTABLE CARDIOVERTER DEFIBRILLATOR UPGRADE N/A 09/25/2012   Procedure: BI-VENTRICULAR IMPLANTABLE CARDIOVERTER DEFIBRILLATOR UPGRADE;  Surgeon: Coralyn Mark, MD;  Location: [facility] CATH LAB;  Service:  Cardiovascular;  Laterality: N/A;  . BREAST BIOPSY Right 04/05/2014  . CARDIAC CATHETERIZATION    . CARDIAC DEFIBRILLATOR PLACEMENT  4/08   by Dr Leonia Reeves (MDT)  . COLONOSCOPY WITH PROPOFOL N/A 10/12/2016   Procedure: COLONOSCOPY WITH PROPOFOL;  Surgeon: Doran Stabler, MD;  Location: [facility] ENDOSCOPY;  Service: Gastroenterology;  Laterality: N/A;  . CORONARY ARTERY BYPASS GRAFT  10/05/2005   by Dr Cyndia Bent  . IMPLANTABLE CARDIOVERTER DEFIBRILLATOR IMPLANT  09/25/12   attempt of upgrade to CRT-D unsuccessful due to CS anatomy, SJM Unify Asaura device placed with LV port capped by Dr Rayann Heman  . IR GENERIC HISTORICAL  10/03/2015   IR US GUIDE VASC ACCESS LEFT 10/03/2015 Jacqulynn Cadet, MD [facility]  . IR GENERIC HISTORICAL  10/03/2015   IR VENO/EXT/UNI LEFT 10/03/2015 Jacqulynn Cadet, MD [facility]  . IR GENERIC HISTORICAL  10/03/2015   IR VENOCAVAGRAM IVC 10/03/2015 Jacqulynn Cadet, MD [facility]  . IR GENERIC HISTORICAL  10/03/2015   IR INFUSION THROMBOL VENOUS INITIAL (MS) 10/03/2015 Jacqulynn Cadet, MD [facility]  . IR GENERIC HISTORICAL  10/03/2015   IR US GUIDE VASC ACCESS LEFT 10/03/2015 Jacqulynn Cadet, MD [facility]  . IR GENERIC HISTORICAL  10/04/2015   IR THROMBECT VENO MECH MOD SED 10/04/2015 Sandi Mariscal, MD [facility]  . IR GENERIC HISTORICAL  10/04/2015   IR TRANSCATH PLC STENT 1ST ART NOT LE CV CAR VERT CAR 10/04/2015 Sandi Mariscal, MD [facility]  . IR GENERIC HISTORICAL  10/04/2015   IR THROMB F/U EVAL ART/VEN FINAL DAY (MS) 10/04/2015 Sandi Mariscal, MD [facility]  . IR GENERIC HISTORICAL  11/02/2015   IR RADIOLOGIST EVAL & MGMT 11/02/2015 Sandi Mariscal, MD [facility]  . IR RADIOLOGIST EVAL & MGMT  04/26/2016  . LEFT HEART CATH AND CORS/GRAFTS ANGIOGRAPHY N/A 01/19/2019   Procedure: LEFT HEART CATH AND CORS/GRAFTS ANGIOGRAPHY;  Surgeon: Belva Crome, MD;  Location: [facility] CV LAB;  Service: Cardiovascular;  Laterality: N/A;    Social History:  reports that  she has never smoked. She has never used smokeless tobacco. She reports that she does not drink alcohol and does not use drugs. Family History:  Family History  Problem Relation Age of Onset  . Diabetes Mother 53       died - HTN  . Stroke Mother   . High blood pressure Mother   . Sudden death Mother   . Obesity Mother   . Other Other        No early family hx of CAD     HOME MEDICATIONS: Allergies as of 05/26/2020      Reactions   Lipitor  [atorvastatin Calcium] Rash   Metformin And Related Itching, Swelling, Other (See Comments)   Leg pain & swelling in legs   Atorvastatin  Itching, Rash   Levofloxacin Itching      Medication List       Accurate as of May 26, 2020 11:59 PM. If you have any questions, ask your nurse or doctor.        albuterol 108 (90 Base) MCG/ACT inhaler Commonly known as: VENTOLIN HFA Inhale 2 puffs into the lungs every 6 (six) hours as needed for wheezing or shortness of breath.   albuterol (2.5 MG/3ML) 0.083% nebulizer solution Commonly known as: PROVENTIL Take 2.5 mg by nebulization every 6 (six) hours as needed for wheezing or shortness of breath.   amitriptyline 10 MG tablet Commonly known as: ELAVIL Take 10 mg by mouth daily.   apixaban 5 MG Tabs tablet Commonly known as: ELIQUIS Take 1 tablet (5 mg total) by mouth 2 (two) times daily.   carvedilol 6.25 MG tablet Commonly known as: COREG Take 1 tablet (6.25 mg total) by mouth 2 (two) times daily with a meal.   cyclobenzaprine 5 MG tablet Commonly known as: FLEXERIL Take 5 mg by mouth 3 (three) times daily as needed for muscle spasms.   dapagliflozin propanediol 10 MG Tabs tablet Commonly known as: Farxiga Take 1 tablet (10 mg total) by mouth daily.   Dexcom G6 Sensor Misc 1 Device by Does not apply route as directed.   Dexcom G6 Transmitter Misc 1 Device by Does not apply route as directed.   Droplet Pen Needles 32G X 4 MM Misc Generic drug: Insulin Pen Needle USE IN THE  MORNING, AT NOON, IN THE EVENING, AND AT BEDTIME.   ferrous sulfate 325 (65 FE) MG EC tablet Take 325 mg by mouth daily.   fluconazole 150 MG tablet Commonly known as: DIFLUCAN Take 150 mg by mouth every 7 (seven) days.   furosemide 40 MG tablet Commonly known as: LASIX TAKE 1 TABLET EVERY DAY   isosorbide dinitrate 30 MG tablet Commonly known as: ISORDIL Take 30 mg by mouth every morning.   latanoprost 0.005 % ophthalmic solution Commonly known as: XALATAN Place 1 drop into both eyes at bedtime.   lisinopril 2.5 MG tablet Commonly known as: ZESTRIL TAKE 1 TABLET EVERY DAY   methocarbamol 500 MG tablet Commonly known as: ROBAXIN Take 1 tablet (500 mg total) by mouth 2 (two) times daily as needed for muscle spasms.   MUSCLE RELIEF EX Apply 1 application topically daily. For pain   nitroGLYCERIN 0.4 MG SL tablet Commonly known as: NITROSTAT Place 0.4 mg under the tongue every 5 (five) minutes as needed for chest pain.   NovoLOG FlexPen 100 UNIT/ML FlexPen Generic drug: insulin aspart Inject 6 Units into the skin 3 (three) times daily with meals.   Ozempic (1 MG/DOSE) 4 MG/3ML Sopn Generic drug: Semaglutide (1 MG/DOSE) INJECT 1 MG TOTAL INTO THE SKIN ONCE A WEEK.   potassium chloride SA 20 MEQ tablet Commonly known as: KLOR-CON Take 20 mEq by mouth daily.   rosuvastatin 20 MG tablet Commonly known as: CRESTOR Take 1 tablet (20 mg total) by mouth daily at 6 PM.   traMADol 50 MG tablet Commonly known as: ULTRAM Take 50 mg by mouth 2 (two) times daily.   Tyler Aas FlexTouch 100 UNIT/ML FlexTouch Pen Generic drug: insulin degludec Inject 24 Units into the skin daily. What changed: how much to take Changed by: Dorita Sciara, MD   TRUEplus Lancets 33G Misc Apply 1 each topically 3 (three) times daily.   Vitamin D (Ergocalciferol) 1.25 MG (50000 UNIT) Caps capsule Commonly known as:  DRISDOL Take 1 capsule (50,000 Units total) by mouth every 7 (seven)  days.        OBJECTIVE:   Vital Signs: BP 126/82   Pulse 62   Ht _0  (1.6 m)   Wt 223 lb 6 oz (101.3 kg)   SpO2 99%   BMI 39.57 kg/m   Wt Readings from Last 3 Encounters:  05/26/20 223 lb 6 oz (101.3 kg)  04/12/20 225 lb (102.1 kg)  03/16/20 231 lb (104.8 kg)     Exam: General: Pt appears well and is in NAD  Lungs: Clear with good BS bilat with no rales, rhonchi, or wheezes  Heart: RRR with normal S1 and S2 and no gallops; no murmurs; no rub  Extremities:  Trace pretibial edema.  Neuro: MS is good with appropriate affect, pt is alert and Ox3     DM Foot Exam 02/25/2020    The skin of the feet is  without sores or ulcerations, but has left 3rd toe scab as well as scattered ankle scabs The pedal pulses are undetectable  The sensation is decreased to a screening 5.07, 10 gram monofilament bilaterally ( decreased around the toes on the right and decreased mid arch on the left )      DATA REVIEWED:  Lab Results  Component Value Date   HGBA1C 7.0 (A) 05/26/2020   HGBA1C 6.1 (A) 02/25/2020   HGBA1C 5.8 (A) 11/19/2019   Lab Results  Component Value Date   LDLCALC 64 11/03/2018   CREATININE 1.29 (H) 04/12/2020    Lab Results  Component Value Date   CHOL 111 11/03/2018   HDL 30 (L) 11/03/2018   LDLCALC 64 11/03/2018   TRIG 87 11/03/2018   CHOLHDL 3.6 08/05/2017       Results for SHIAH, BERHOW (MRN [id number]) as of 05/26/2020 10:50  Ref. Range 04/12/2020 96:29  BASIC METABOLIC PANEL Unknown Rpt (A)  Sodium Latest Ref Range: 134 - 144 mmol/L 138  Potassium Latest Ref Range: 3.5 - 5.2 mmol/L 5.0  Chloride Latest Ref Range: 96 - 106 mmol/L 100  CO2 Latest Ref Range: 20 - 29 mmol/L 22  Glucose Latest Ref Range: 65 - 99 mg/dL 136 (H)  BUN Latest Ref Range: 8 - 27 mg/dL 20  Creatinine Latest Ref Range: 0.57 - 1.00 mg/dL 1.29 (H)  Calcium Latest Ref Range: 8.7 - 10.3 mg/dL 9.9  BUN/Creatinine Ratio Latest Ref Range: 12 - 28  16  eGFR Latest Ref Range: >59  mL/min/1.73 45 (L)   ASSESSMENT / PLAN / RECOMMENDATIONS:   1) Type 2 Diabetes Mellitus, Optimally controlled, With Neuropathic and  macrovascular complications - Most recent A1c of 7.0 %. Goal A1c < 7.0 %.    -Her A1c remains at goal -She was able to obtain the Dexcom but today she is having problems with the sensor, I was able to calibrated but the sensor continues not to work.  She was provided with extra sensors today and was instructed to contact the tech-support for Dexcom to help her through this malfunction. -No evidence of hypoglycemia -She has not had the need to use NovoLog as much -We will increase basal insulin as below -She is tolerating the rest without problems  MEDICATIONS: -Increase Tresiba to 24 units ONCE  Daily  - Continue Ozempic 1 mg once weekly  -Continue Farxiga 10 mg, 1 tablet with Breakfast  - Correction factor: Novolog ( BG-130/40)  EDUCATION / INSTRUCTIONS:  BG monitoring instructions: Patient is instructed to check  her blood sugars 3 times a day, before meals  Call [facility] if: BG persistently < 70 . I reviewed the Rule of 15 for the treatment of hypoglycemia in detail with the patient. Literature supplied.   2) Diabetic complications:   Eye: Does not have known diabetic retinopathy.   Neuro/ Feet: Does  have known diabetic peripheral neuropathy based on foot exam from 07/16/2019  Renal: Patient does not have known baseline CKD. She is not on an ACEI/ARB at present.   F/U in 3 months   Signed electronically by: Mack Guise, MD  [facility] Endocrinology  [facility] [address] [place], [place] [address] Phone: [phone] FAX: [phone]   CC: Everardo Beals, [place] [address] Phone: [phone]  Fax: [phone]  Return to Endocrinology clinic as below: Future Appointments  Date Time Provider [place]  06/20/2020  7:15 AM [facility]  DEVICE REMOTES [facility]  07/01/2020  3:00 PM Marzetta Board, DPM [facility]  07/25/2020  9:40 AM [facility] DEVICE REMOTES [facility]  08/08/2020  9:45 AM Tommie Raymond, NP [facility]  08/19/2020 11:00 AM [facility] DX DEXA 1 [facility]  08/31/2020 10:30 AM Shynia Daleo, Melanie Crazier, MD [facility] None  10/24/2020  9:40 AM [facility] DEVICE REMOTES [facility]  01/23/2021  9:40 AM [facility] DEVICE REMOTES [facility]  04/24/2021  9:40 AM [facility] DEVICE REMOTES [facility]  07/24/2021  9:40 AM [facility] DEVICE REMOTES [facility]  10/23/2021  9:40 AM [facility] DEVICE REMOTES [facility]  01/22/2022  9:40 AM [facility] DEVICE REMOTES [facility]

## 2020-05-30 ENCOUNTER — Other Ambulatory Visit: Payer: Self-pay | Admitting: Internal Medicine

## 2020-05-30 ENCOUNTER — Other Ambulatory Visit: Payer: Self-pay | Admitting: *Deleted

## 2020-05-30 DIAGNOSIS — Z1231 Encounter for screening mammogram for malignant neoplasm of breast: Secondary | ICD-10-CM

## 2020-06-20 ENCOUNTER — Ambulatory Visit (INDEPENDENT_AMBULATORY_CARE_PROVIDER_SITE_OTHER): Payer: Medicare HMO

## 2020-06-20 DIAGNOSIS — I5042 Chronic combined systolic (congestive) and diastolic (congestive) heart failure: Secondary | ICD-10-CM | POA: Diagnosis not present

## 2020-06-20 DIAGNOSIS — Z95810 Presence of automatic (implantable) cardiac defibrillator: Secondary | ICD-10-CM | POA: Diagnosis not present

## 2020-06-21 ENCOUNTER — Ambulatory Visit: Payer: Medicare HMO | Admitting: Podiatry

## 2020-06-22 NOTE — Progress Notes (Signed)
EPIC Encounter for ICM Monitoring  Patient Name: [name] is a 70 y.o. female Date: 06/22/2020 Primary Care Physican: Everardo Beals, NP Primary Cardiologist: Skains/Weaver PA Electrophysiologist: Allred 06/22/2020 Weight: 222 lbs   AT/AF Burden: <1% (taking Eliquis)                                                          Spoke with patient and reports feeling well at this time. Heart failure questions reviewed. Pt asymptomatic.    CorVue thoracic impedance suggesting normal fluid levels.   Prescribed: Furosemide 40 mg take 1 tablet daily Potassium 20 mEq take 1 tablet daily   Labs: 04/12/2020 Creatinine 1.29, BUN 20 Potassium 5.0, Sodium 138 03/01/2020 Creatinine 1.59, BUN 23, Potassium 4.7, Sodium 136 02/16/2020 Creatinine 1.04, BUN 26, Potassium 4.4, Sodium 137, GFR 55-63  A complete set of results can be found in Results Review.   Recommendations:  No changes and encouraged to call if experiencing any fluid symptoms.   Next ICM clinic phone appointment due: 07/26/2020.     Next 91 day remote transmission due: 07/25/2020.     EP/Cardiology Office Visits:  08/08/2020 with Kathyrn Drown, NP.  Recall 01/16/2021 with Tommye Standard, PA.   Copy of ICM check sent to Dr. Rayann Heman.   3 month ICM trend: 06/20/2020.    1 Year ICM trend:       [name], RN 06/22/2020 8:41 AM

## 2020-06-28 ENCOUNTER — Telehealth: Payer: Self-pay | Admitting: *Deleted

## 2020-06-28 NOTE — Telephone Encounter (Signed)
Patient is calling to confirm her upcoming appointment,Thur. @3 :00

## 2020-07-01 ENCOUNTER — Other Ambulatory Visit: Payer: Self-pay

## 2020-07-01 ENCOUNTER — Ambulatory Visit (INDEPENDENT_AMBULATORY_CARE_PROVIDER_SITE_OTHER): Payer: Medicare HMO | Admitting: Podiatry

## 2020-07-01 ENCOUNTER — Telehealth: Payer: Self-pay | Admitting: Internal Medicine

## 2020-07-01 ENCOUNTER — Encounter: Payer: Self-pay | Admitting: Podiatry

## 2020-07-01 DIAGNOSIS — E1151 Type 2 diabetes mellitus with diabetic peripheral angiopathy without gangrene: Secondary | ICD-10-CM | POA: Diagnosis not present

## 2020-07-01 DIAGNOSIS — M79674 Pain in right toe(s): Secondary | ICD-10-CM | POA: Diagnosis not present

## 2020-07-01 DIAGNOSIS — B351 Tinea unguium: Secondary | ICD-10-CM | POA: Diagnosis not present

## 2020-07-01 DIAGNOSIS — M79675 Pain in left toe(s): Secondary | ICD-10-CM | POA: Diagnosis not present

## 2020-07-01 NOTE — Telephone Encounter (Signed)
Pt voiced that [facility] is needing paper work regarding the diabetic shoes.

## 2020-07-03 NOTE — Progress Notes (Signed)
  Subjective:  Patient ID: [name], female    DOB: [birth year],  MRN: [id number]  [name] presents to clinic today for at risk foot care. Pt has h/o NIDDM with PAD and painful thick toenails that are difficult to trim. Pain interferes with ambulation. Aggravating factors include wearing enclosed shoe gear. Pain is relieved with periodic professional debridement.  Patient states blood glucose was 64 mg/dl today.  Patient would like to know status of diabetic shoes on today's visit.  PCP is Everardo Beals, NP , and last visit was 02/26/2020.  She sees Dr. Collier Flowers for her diabetes and last visit was 07/01/2020.  Allergies  Allergen Reactions   Lipitor  [Atorvastatin Calcium] Rash   Metformin And Related Itching, Swelling and Other (See Comments)    Leg pain & swelling in legs   Atorvastatin Itching and Rash   Levofloxacin Itching    Review of Systems: Negative except as noted in the HPI. Objective:   Constitutional [name] is a pleasant 70 y.o. African American female, morbidly obese in NAD. AAO x 3.   Vascular Capillary refill time to digits <4 seconds b/l lower extremities. Nonpalpable pedal pulse(s) b/l lower extremities. Pedal hair absent. Lower extremity skin temperature gradient within normal limits. No pain with calf compression b/l. Trace edema noted b/l lower extremities. No cyanosis or clubbing noted.  Neurologic Normal speech. Oriented to person, place, and time. Protective sensation decreased with 10 gram monofilament b/l.  Dermatologic Pedal skin with normal turgor, texture and tone b/l lower extremities No open wounds b/l lower extremities No interdigital macerations b/l lower extremities Toenails 1-5 b/l elongated, discolored, dystrophic, thickened, crumbly with subungual debris and tenderness to dorsal palpation.  Orthopedic: Normal muscle strength 5/5 to all lower extremity muscle groups bilaterally. No pain crepitus or joint limitation  noted with ROM b/l. Pes planus deformity noted b/l.    Radiographs: None Assessment:   1. Pain due to onychomycosis of toenails of both feet   2. Type II diabetes mellitus with peripheral circulatory disorder [facility])    Plan:  Patient was evaluated and treated and all questions answered.  Onychomycosis with pain -Nails palliatively debridement as below -Educated on self-care  Procedure: Nail Debridement Rationale: Pain Type of Debridement: manual, sharp debridement. Instrumentation: Nail nipper, rotary burr. Number of Nails: 10 -Examined patient. -We are still awaiting her diabetic shoe certification form before her shoes before they can be ordered. Patient was informed. -Continue diabetic foot care principles. -Patient to continue soft, supportive shoe gear daily. -Toenails 1-5 b/l were debrided in length and girth with sterile nail nippers and dremel without iatrogenic bleeding.  -Patient to report any pedal injuries to medical professional immediately. -Patient/POA to call should there be question/concern in the interim.  No follow-ups on file.  Marzetta Board, DPM

## 2020-07-04 NOTE — Telephone Encounter (Signed)
Notified faxed the complete paperwork on 05/05/20. Refaxed  paperwork to [facility] [phone].

## 2020-07-25 ENCOUNTER — Ambulatory Visit (INDEPENDENT_AMBULATORY_CARE_PROVIDER_SITE_OTHER): Payer: Medicare HMO

## 2020-07-25 DIAGNOSIS — I255 Ischemic cardiomyopathy: Secondary | ICD-10-CM

## 2020-07-25 DIAGNOSIS — I5042 Chronic combined systolic (congestive) and diastolic (congestive) heart failure: Secondary | ICD-10-CM

## 2020-07-26 ENCOUNTER — Ambulatory Visit (INDEPENDENT_AMBULATORY_CARE_PROVIDER_SITE_OTHER): Payer: Medicare HMO

## 2020-07-26 ENCOUNTER — Ambulatory Visit: Payer: Medicare HMO

## 2020-07-26 DIAGNOSIS — I5042 Chronic combined systolic (congestive) and diastolic (congestive) heart failure: Secondary | ICD-10-CM | POA: Diagnosis not present

## 2020-07-26 DIAGNOSIS — Z95810 Presence of automatic (implantable) cardiac defibrillator: Secondary | ICD-10-CM | POA: Diagnosis not present

## 2020-07-26 LAB — CUP PACEART REMOTE DEVICE CHECK
Battery Remaining Longevity: 28 mo
Battery Remaining Percentage: 27 %
Battery Voltage: 2.83 V
Brady Statistic AP VP Percent: 1 %
Brady Statistic AP VS Percent: 1 %
Brady Statistic AS VP Percent: 1 %
Brady Statistic AS VS Percent: 99 %
Brady Statistic RA Percent Paced: 1 %
Brady Statistic RV Percent Paced: 1 %
Date Time Interrogation Session: 20220719152238
HighPow Impedance: 40 Ohm
HighPow Impedance: 40 Ohm
Implantable Lead Implant Date: 20080423
Implantable Lead Implant Date: 20080423
Implantable Lead Implant Date: 20140918
Implantable Lead Location: 753858
Implantable Lead Location: 753859
Implantable Lead Location: 753860
Implantable Lead Model: 5076
Implantable Lead Model: 6947
Implantable Pulse Generator Implant Date: 20140918
Lead Channel Impedance Value: 350 Ohm
Lead Channel Impedance Value: 450 Ohm
Lead Channel Pacing Threshold Amplitude: 0.5 V
Lead Channel Pacing Threshold Amplitude: 0.5 V
Lead Channel Pacing Threshold Pulse Width: 0.5 ms
Lead Channel Pacing Threshold Pulse Width: 0.5 ms
Lead Channel Sensing Intrinsic Amplitude: 1.2 mV
Lead Channel Sensing Intrinsic Amplitude: 12 mV
Lead Channel Setting Pacing Amplitude: 2 V
Lead Channel Setting Pacing Amplitude: 2.5 V
Lead Channel Setting Pacing Pulse Width: 0.5 ms
Lead Channel Setting Sensing Sensitivity: 0.5 mV
Pulse Gen Serial Number: 7070007

## 2020-07-27 LAB — CUP PACEART REMOTE DEVICE CHECK
Battery Remaining Longevity: 28 mo
Battery Remaining Percentage: 27 %
Battery Voltage: 2.83 V
Brady Statistic AP VP Percent: 1 %
Brady Statistic AP VS Percent: 1 %
Brady Statistic AS VP Percent: 1 %
Brady Statistic AS VS Percent: 99 %
Brady Statistic RA Percent Paced: 1 %
Brady Statistic RV Percent Paced: 1 %
Date Time Interrogation Session: 20220719152238
HighPow Impedance: 40 Ohm
HighPow Impedance: 40 Ohm
Implantable Lead Implant Date: 20080423
Implantable Lead Implant Date: 20080423
Implantable Lead Implant Date: 20140918
Implantable Lead Location: 753858
Implantable Lead Location: 753859
Implantable Lead Location: 753860
Implantable Lead Model: 5076
Implantable Lead Model: 6947
Implantable Pulse Generator Implant Date: 20140918
Lead Channel Impedance Value: 350 Ohm
Lead Channel Impedance Value: 450 Ohm
Lead Channel Pacing Threshold Amplitude: 0.5 V
Lead Channel Pacing Threshold Amplitude: 0.5 V
Lead Channel Pacing Threshold Pulse Width: 0.5 ms
Lead Channel Pacing Threshold Pulse Width: 0.5 ms
Lead Channel Sensing Intrinsic Amplitude: 1.2 mV
Lead Channel Sensing Intrinsic Amplitude: 12 mV
Lead Channel Setting Pacing Amplitude: 2 V
Lead Channel Setting Pacing Amplitude: 2.5 V
Lead Channel Setting Pacing Pulse Width: 0.5 ms
Lead Channel Setting Sensing Sensitivity: 0.5 mV
Pulse Gen Serial Number: 7070007

## 2020-07-29 ENCOUNTER — Ambulatory Visit
Admission: RE | Admit: 2020-07-29 | Discharge: 2020-07-29 | Disposition: A | Discharge status: Home / Self Care | Payer: Medicare HMO | Source: Ambulatory Visit | Attending: *Deleted | Admitting: *Deleted

## 2020-07-29 ENCOUNTER — Other Ambulatory Visit: Payer: Self-pay

## 2020-07-29 DIAGNOSIS — Z1231 Encounter for screening mammogram for malignant neoplasm of breast: Secondary | ICD-10-CM

## 2020-08-01 NOTE — Progress Notes (Signed)
EPIC Encounter for ICM Monitoring  Patient Name: [name] is a 70 y.o. female Date: 08/01/2020 Primary Care Physican: Everardo Beals, NP Primary Cardiologist: Skains/Weaver PA Electrophysiologist: Allred 06/22/2020 Weight: 222 lbs   AT/AF Burden: <1% (taking Eliquis)                                                          Transmission reviewed.    CorVue thoracic impedance suggesting normal fluid levels.   Prescribed: Furosemide 40 mg take 1 tablet daily Potassium 20 mEq take 1 tablet daily   Labs: 04/12/2020 Creatinine 1.29, BUN 20 Potassium 5.0, Sodium 138 03/01/2020 Creatinine 1.59, BUN 23, Potassium 4.7, Sodium 136 02/16/2020 Creatinine 1.04, BUN 26, Potassium 4.4, Sodium 137, GFR 55-63  A complete set of results can be found in Results Review.   Recommendations:  No changes.   Next ICM clinic phone appointment due: 09/05/2020.     Next 91 day remote transmission due: 10/24/2020.     EP/Cardiology Office Visits:  08/08/2020 with Kathyrn Drown, NP.  Recall 01/16/2021 with Tommye Standard, PA.   Copy of ICM check sent to Dr. Rayann Heman.   3 month ICM trend: 07/26/2020.    1 Year ICM trend:       [name], RN 08/01/2020 11:28 AM

## 2020-08-04 NOTE — Progress Notes (Signed)
Cardiology Office Note   Date:  08/08/2020   ID:  [name], DOB May 30, 1950, MRN [id number]  PCP:  Everardo Beals, NP  Cardiologist:  Dr. Marlou Porch, MD   Chief Complaint  Patient presents with   Follow-up   History of Present Illness: [name] is a 70 y.o. female who presents for follow up, seen for Dr. Marlou Porch.   [name] has a hx of chronic systolic heart failure, ICM with an LVEF initially at 40-45% per echo with normalization per last echo 2021 s/p ICD implantation, CABG 2007, OSA, DM2, HTN and bilateral lower extremity DVT on Eliquis.   She was most recently seen in follow up 04/12/20 at which time she was volume up>>she was continued on Lasix 55m QD.   Today she reports she has been doing well from a CV standpoint. She denies chest pain, SOB, LE edema, palpitations, PND or SOB. She weighs herself daily and states that she has been very stable with her weights. She monitors her slat intake well along with her fluid intake. She has no specific concerns.   Past Medical History:  Diagnosis Date   Abnormal LFTs    AICD (automatic cardioverter/defibrillator) present    Angina decubitus ([place] 11/06/2012   [place]ina pectoris [place]    Anxiety    Arthritis    Back pain    Bundle branch block 05/2016   CAD (coronary artery disease) 06/18/2010   CHF (congestive heart failure) (HCC)    Chronic combined systolic and diastolic CHF, NYHA class 3 (HCC)    Chronic combined systolic and diastolic heart failure (HCC)    Chronic systolic dysfunction of left ventricle 09/24/2012   Coronary artery disease    s/p CABG 2007   Depression    Dermal mycosis    Diabetes mellitus    type II   DKA (diabetic ketoacidoses) 01/12/2019   DVT (deep venous thrombosis) ([place] 09/2015   bilateral   Edema, lower extremity    Glaucoma    Hyperlipidemia    Hypertension    Hypoglycemia 09/09/2012   Insomnia    Insulin dependent diabetes mellitus ([facility] 04/23/2012   Ischemic cardiomyopathy     EF 30-35%, s/p ICD 4/08 by Dr ELeonia Reeves  Joint pain    LBBB (left bundle branch block) 09/24/2012   Morbid obesity ([facility] 11/06/2012   Obesity    Oxygen dependent 2 L   Pain in left knee    Peripheral neuropathy    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   LIMA to D2, SVG to ramus intermediate, sequential SVG to OM1-OM2, SVG to RCA with EVH via both legs    Sleep apnea    uses O2 at night   Tinea    Type 2 diabetes mellitus with hyperglycemia, with long-term current use of insulin ([facility] 04/20/2019   Uncontrolled diabetes mellitus ([place] 02/2019   Ventricular tachycardia ([place] 01/16/15   sustained VT terminated with ATP, CL 340 msec   Vitamin B 12 deficiency 11/05/2018   Vitamin D deficiency 11/05/2018    Past Surgical History:  Procedure Laterality Date   BI-VENTRICULAR IMPLANTABLE CARDIOVERTER DEFIBRILLATOR UPGRADE N/A 09/25/2012   Procedure: BI-VENTRICULAR IMPLANTABLE CARDIOVERTER DEFIBRILLATOR UPGRADE;  Surgeon: JCoralyn Mark MD;  [facility];  Service: Cardiovascular;  Laterality: N/A;   BREAST BIOPSY Right 04/05/2014   CARDIAC CATHETERIZATION     CARDIAC DEFIBRILLATOR PLACEMENT  4/08   by Dr ELeonia Reeves(MDT)   COLONOSCOPY WITH PROPOFOL N/A 10/12/2016  Procedure: COLONOSCOPY WITH PROPOFOL;  Surgeon: Doran Stabler, MD;  Location: [facility];  Service: Gastroenterology;  Laterality: N/A;   CORONARY ARTERY BYPASS GRAFT  10/05/2005   by Dr Cyndia Bent   IMPLANTABLE CARDIOVERTER DEFIBRILLATOR IMPLANT  09/25/12   attempt of upgrade to CRT-D unsuccessful due to CS anatomy, SJM Unify [place]aura device placed with LV port capped by Dr Rayann Heman   IR GENERIC HISTORICAL  10/03/2015   IR US GUIDE VASC ACCESS LEFT 10/03/2015 Jacqulynn Cadet, MD [facility]   IR GENERIC HISTORICAL  10/03/2015   IR VENO/EXT/UNI LEFT 10/03/2015 Jacqulynn Cadet, MD [facility]   IR GENERIC HISTORICAL  10/03/2015   IR VENOCAVAGRAM IVC 10/03/2015 Jacqulynn Cadet, MD [facility]   IR GENERIC HISTORICAL  10/03/2015   IR  INFUSION THROMBOL VENOUS INITIAL (MS) 10/03/2015 Jacqulynn Cadet, MD [facility]   IR GENERIC HISTORICAL  10/03/2015   IR US GUIDE VASC ACCESS LEFT 10/03/2015 Jacqulynn Cadet, MD [facility]   IR GENERIC HISTORICAL  10/04/2015   IR THROMBECT VENO MECH MOD SED 10/04/2015 Sandi Mariscal, MD [facility]   IR GENERIC HISTORICAL  10/04/2015   IR TRANSCATH PLC STENT 1ST ART NOT LE CV CAR VERT CAR 10/04/2015 Sandi Mariscal, MD [facility]   IR GENERIC HISTORICAL  10/04/2015   IR THROMB F/U EVAL ART/VEN FINAL DAY (MS) 10/04/2015 Sandi Mariscal, MD [facility]   IR GENERIC HISTORICAL  11/02/2015   IR RADIOLOGIST EVAL & MGMT 11/02/2015 Sandi Mariscal, MD [facility]   IR RADIOLOGIST EVAL & MGMT  04/26/2016   LEFT HEART CATH AND CORS/GRAFTS ANGIOGRAPHY N/A 01/19/2019   Procedure: LEFT HEART CATH AND CORS/GRAFTS ANGIOGRAPHY;  Surgeon: Belva Crome, MD;  Location: [facility];  Service: Cardiovascular;  Laterality: N/A;     Current Outpatient Medications  Medication Sig Dispense Refill   albuterol (PROVENTIL HFA;VENTOLIN HFA) 108 (90 BASE) MCG/ACT inhaler Inhale 2 puffs into the lungs every 6 (six) hours as needed for wheezing or shortness of breath.     albuterol (PROVENTIL) (2.5 MG/3ML) 0.083% nebulizer solution Take 2.5 mg by nebulization every 6 (six) hours as needed for wheezing or shortness of breath.      amitriptyline (ELAVIL) 10 MG tablet Take 10 mg by mouth daily.     AMLODIPINE BENZOATE PO amlodipine     apixaban (ELIQUIS) 5 MG TABS tablet Take 1 tablet (5 mg total) by mouth 2 (two) times daily. 60 tablet 12   Baclofen 5 MG TABS baclofen 5 mg tablet  TAKE 1 TABLET TWICE DAILY AS NEEDED  FOR  MUSCLE  SPASM     Blood Glucose Monitoring Suppl (GNP TRUE METRIX GLUCOSE METER) w/Device KIT True Metrix Glucose Meter kit  USE AS DIRECTED     Capsaicin (MUSCLE RELIEF EX) Apply 1 application topically daily. For pain     carvedilol (COREG) 6.25 MG tablet Take 1 tablet (6.25 mg total) by mouth 2  (two) times daily with a meal. 60 tablet 0   Continuous Blood Gluc Sensor (DEXCOM G6 SENSOR) MISC 1 Device by Does not apply route as directed. 9 each 3   Continuous Blood Gluc Transmit (DEXCOM G6 TRANSMITTER) MISC 1 Device by Does not apply route as directed. 1 each 3   cyclobenzaprine (FLEXERIL) 5 MG tablet Take 5 mg by mouth 3 (three) times daily as needed for muscle spasms.     dapagliflozin propanediol (FARXIGA) 10 MG TABS tablet Take 1 tablet (10 mg total) by mouth daily. 90 tablet  3   DROPLET PEN NEEDLES 32G X 4 MM MISC USE IN THE MORNING, AT NOON, IN THE EVENING, AND AT BEDTIME. 400 each 3   ferrous sulfate 325 (65 FE) MG EC tablet Take 325 mg by mouth daily.      fluconazole (DIFLUCAN) 150 MG tablet Take 150 mg by mouth every 7 (seven) days.     furosemide (LASIX) 40 MG tablet TAKE 1 TABLET EVERY DAY 90 tablet 3   insulin degludec (TRESIBA FLEXTOUCH) 100 UNIT/ML FlexTouch Pen Inject 24 Units into the skin daily. 30 mL 3   isosorbide dinitrate (ISORDIL) 30 MG tablet Take 30 mg by mouth every morning.     latanoprost (XALATAN) 0.005 % ophthalmic solution Place 1 drop into both eyes at bedtime.     lisinopril (ZESTRIL) 2.5 MG tablet TAKE 1 TABLET EVERY DAY 90 tablet 3   methocarbamol (ROBAXIN) 500 MG tablet Take 1 tablet (500 mg total) by mouth 2 (two) times daily as needed for muscle spasms.     nitroGLYCERIN (NITROSTAT) 0.4 MG SL tablet Place 0.4 mg under the tongue every 5 (five) minutes as needed for chest pain.     NOVOLOG FLEXPEN 100 UNIT/ML FlexPen Inject 6 Units into the skin 3 (three) times daily with meals. 30 mL 3   OZEMPIC, 1 MG/DOSE, 4 MG/3ML SOPN INJECT 1 MG TOTAL INTO THE SKIN ONCE A WEEK. 6 mL 1   potassium chloride SA (KLOR-CON) 20 MEQ tablet Take 20 mEq by mouth daily.     traMADol (ULTRAM) 50 MG tablet Take 50 mg by mouth 2 (two) times daily.     TRUEplus Lancets 33G MISC Apply 1 each topically 3 (three) times daily.     Vitamin D, Ergocalciferol, (DRISDOL) 1.25 MG (50000  UT) CAPS capsule Take 1 capsule (50,000 Units total) by mouth every 7 (seven) days. 4 capsule 0   rosuvastatin (CRESTOR) 20 MG tablet Take 1 tablet (20 mg total) by mouth daily at 6 PM. (Patient not taking: Reported on 08/08/2020) 30 tablet 2   sulfamethoxazole-trimethoprim (BACTRIM DS) 800-160 MG tablet sulfamethoxazole 800 mg-trimethoprim 160 mg tablet     No current facility-administered medications for this visit.    Allergies:   Lipitor  [atorvastatin calcium], Metformin and related, Atorvastatin, and Levofloxacin    Social History:  The patient  reports that she has never smoked. She has never used smokeless tobacco. She reports that she does not drink alcohol and does not use drugs.   Family History:  The patient's family history includes Diabetes (age of onset: 23) in her mother; High blood pressure in her mother; Obesity in her mother; Other in an other family member; Stroke in her mother; Sudden death in her mother.    ROS:  Please see the history of present illness.  Otherwise, review of systems are positive for none.   All other systems are reviewed and negative.    PHYSICAL EXAM: VS:  BP 130/80   Pulse 79   Ht _0  (1.6 m)   Wt 220 lb 12.8 oz (100.2 kg)   SpO2 98%   BMI 39.11 kg/m  , BMI Body mass index is 39.11 kg/m.  General: Well developed, well nourished, NAD Neck: Negative for carotid bruits. No JVD Lungs:Clear to ausculation bilaterally.Breathing is unlabored. Cardiovascular: RRR with S1 S2. No murmurs Extremities: Very mild edema. Neuro: Alert and oriented. No focal deficits. No facial asymmetry. MAE spontaneously. Psych: Responds to questions appropriately with normal affect.     EKG:  EKG is not ordered today.   Recent Labs: 03/01/2020: Hemoglobin 12.2; Platelets 175 04/12/2020: BUN 20; Creatinine, Ser 1.29; Potassium 5.0; Sodium 138    Lipid Panel    Component Value Date/Time   CHOL 111 11/03/2018 1205   CHOL 90 12/22/2012 1042   TRIG 87 11/03/2018  1205   TRIG 85 12/22/2012 1042   HDL 30 (L) 11/03/2018 1205   HDL 33 (L) 12/22/2012 1042   CHOLHDL 3.6 08/05/2017 1048   CHOLHDL 4.2 04/25/2012 0520   VLDL 23 04/25/2012 0520   LDLCALC 64 11/03/2018 1205   LDLCALC 40 12/22/2012 1042    Wt Readings from Last 3 Encounters:  08/08/20 220 lb 12.8 oz (100.2 kg)  05/26/20 223 lb 6 oz (101.3 kg)  04/12/20 225 lb (102.1 kg)     Other studies Reviewed: Additional studies/ records that were reviewed today include:. Review of the above records demonstrates:   Cardiac catheterization 01/19/2019:  Severe diffuse three-vessel coronary disease with functional total occlusion of the mid LAD, total occlusion of the large first diagonal, total occlusion of the proximal ramus intermedius, total occlusion of the proximal circumflex, and total occlusion of the mid RCA.  All vessels are heavily calcified. Patent saphenous vein graft to the PDA.  Moderate diffuse disease beyond the graft insertion site Patent sequential saphenous vein graft to the first and second obtuse marginal.  Diffuse native vessel disease beyond the bypass graft insertion site. Patent saphenous vein graft to the ramus intermedius.  Diffuse native vessel disease beyond the graft insertion site. Patent left internal mammary graft to the dominant diagonal.  Diffuse native vessel disease beyond the graft insertion site. Chronic systolic heart failure with EF less than 35% and LVEDP 22 mmHg.   RECOMMENDATIONS:   Guideline directed therapy for systolic heart failure.     Diagnostic Dominance: Right         Last echo 01/2019 challenging images-EF perhaps 50%.  Cardiac catheterization around that time however EF estimated 35%.   LHC 01/19/2019:  Severe diffuse three-vessel coronary disease with functional total occlusion of the mid LAD, total occlusion of the large first diagonal, total occlusion of the proximal ramus intermedius, total occlusion of the proximal circumflex, and  total occlusion of the mid RCA.  All vessels are heavily calcified. Patent saphenous vein graft to the PDA.  Moderate diffuse disease beyond the graft insertion site Patent sequential saphenous vein graft to the first and second obtuse marginal.  Diffuse native vessel disease beyond the bypass graft insertion site. Patent saphenous vein graft to the ramus intermedius.  Diffuse native vessel disease beyond the graft insertion site. Patent left internal mammary graft to the dominant diagonal.  Diffuse native vessel disease beyond the graft insertion site. Chronic systolic heart failure with EF less than 35% and LVEDP 22 mmHg.   RECOMMENDATIONS:   Guideline directed therapy for systolic heart failure.   Stress test 01/17/2019  IMPRESSION: 1. Fixed defects involve the septum, apex, anterior wall towards the apex, posterior wall, and posterolateral wall. The size of the fixed defects is worse in the septum in the interval. There may be a small amount of reversibility in the anterior septum as well as in the anterior wall. The possible mild reversibility a subtle.   2. Global hypokinesis. No thickening in the region of fixed defects. Dyskinesis of the septum.   3. Left ventricular ejection fraction 20%   4. Non invasive risk stratification*: High  ASSESSMENT AND PLAN:  1. Chronic systolic heart failure/  ischemic cardiomyopathy: -Appears euvolemic today -ICD with improved LVEF -Continue with goal-directed medical therapy. -Continued on Lasix 61m QD at last OV>>tolerating well -Check BMET today   2. ICD: -Follows with D.r Allred -Last interrogation with normal function    3. Prior DVT/prior thrombolysis with intravascular TPA stenting to left common iliac vein and external iliac vein: -On chronic anticoagulation with Eliquis -No bleeding in stool or urine -Follows closely with PCP  -Wearing compression stockings>>no overt edema noted  -No ASA secondary to Eliquis    4.DM2: -Follows with PCP -Difficult to control -Last HbA1c, 7.0    5. CAD s/p CABG: -Denies anginal symptoms -Continue with risk reduction Aortic atherosclerosis -Not on aspirin secondary to Eliquis use to reduce bleeding. -Continue statin     Current medicines are reviewed at length with the patient today.  The patient does not have concerns regarding medicines.  The following changes have been made:  no change  Labs/ tests ordered today include: BMET  Orders Placed This Encounter  Procedures   Basic metabolic panel     Disposition:   FU with Dr. SMarlou Porchin 6 months  Signed, JKathyrn Drown NP  08/08/2020 10:02 AM    [address]  [id number]: [phone] Fax: [phone]

## 2020-08-08 ENCOUNTER — Ambulatory Visit (INDEPENDENT_AMBULATORY_CARE_PROVIDER_SITE_OTHER): Payer: Medicare HMO | Admitting: Cardiology

## 2020-08-08 ENCOUNTER — Other Ambulatory Visit: Payer: Self-pay

## 2020-08-08 ENCOUNTER — Encounter: Payer: Self-pay | Admitting: Cardiology

## 2020-08-08 VITALS — BP 130/80 | HR 79 | Ht 63.0 in | Wt 220.8 lb

## 2020-08-08 DIAGNOSIS — I447 Left bundle-branch block, unspecified: Secondary | ICD-10-CM

## 2020-08-08 DIAGNOSIS — Z79899 Other long term (current) drug therapy: Secondary | ICD-10-CM | POA: Diagnosis not present

## 2020-08-08 DIAGNOSIS — I255 Ischemic cardiomyopathy: Secondary | ICD-10-CM

## 2020-08-08 DIAGNOSIS — I5042 Chronic combined systolic (congestive) and diastolic (congestive) heart failure: Secondary | ICD-10-CM | POA: Diagnosis not present

## 2020-08-08 DIAGNOSIS — E119 Type 2 diabetes mellitus without complications: Secondary | ICD-10-CM

## 2020-08-08 DIAGNOSIS — I10 Essential (primary) hypertension: Secondary | ICD-10-CM

## 2020-08-08 DIAGNOSIS — Z95810 Presence of automatic (implantable) cardiac defibrillator: Secondary | ICD-10-CM

## 2020-08-08 LAB — BASIC METABOLIC PANEL
BUN/Creatinine Ratio: 16 (ref 12–28)
BUN: 14 mg/dL (ref 8–27)
CO2: 22 mmol/L (ref 20–29)
Calcium: 9.8 mg/dL (ref 8.7–10.3)
Chloride: 104 mmol/L (ref 96–106)
Creatinine, Ser: 0.87 mg/dL (ref 0.57–1.00)
Glucose: 164 mg/dL — ABNORMAL HIGH (ref 65–99)
Potassium: 4.1 mmol/L (ref 3.5–5.2)
Sodium: 141 mmol/L (ref 134–144)
eGFR: 72 mL/min/{1.73_m2} (ref >59)

## 2020-08-08 NOTE — Patient Instructions (Addendum)
  Medication Instructions:  Your physician recommends that you continue on your current medications as directed. Please refer to the Current Medication list given to you today.   *If you need a refill on your cardiac medications before your next appointment, please call your pharmacy*   Lab Work: TODAY: BMET If you have labs (blood work) drawn today and your tests are completely normal, you will receive your results only by: [place] (if you have MyChart) OR A paper copy in the mail If you have any lab test that is abnormal or we need to change your treatment, we will call you to review the results.   Testing/Procedures: NONE   Follow-Up: At [facility], you and your health needs are our priority.  As part of our continuing mission to provide you with exceptional heart care, we have created designated Provider Care Teams.  These Care Teams include your primary Cardiologist (physician) and Advanced Practice Providers (APPs -  Physician Assistants and Nurse Practitioners) who all work together to provide you with the care you need, when you need it.  We recommend signing up for the patient portal called "MyChart".  Sign up information is provided on this After Visit Summary.  MyChart is used to connect with patients for Virtual Visits (Telemedicine).  Patients are able to view lab/test results, encounter notes, upcoming appointments, etc.  Non-urgent messages can be sent to your provider as well.   To learn more about what you can do with MyChart, go to [email or web address].    Your next appointment:   6 month(s)  The format for your next appointment:   In Person  Provider:   Candee Furbish, MD

## 2020-08-16 ENCOUNTER — Other Ambulatory Visit: Payer: Self-pay | Admitting: *Deleted

## 2020-08-16 DIAGNOSIS — Z13820 Encounter for screening for osteoporosis: Secondary | ICD-10-CM

## 2020-08-16 NOTE — Progress Notes (Signed)
Remote ICD transmission.   

## 2020-08-19 ENCOUNTER — Other Ambulatory Visit: Payer: Self-pay

## 2020-08-19 ENCOUNTER — Ambulatory Visit
Admission: RE | Admit: 2020-08-19 | Discharge: 2020-08-19 | Disposition: A | Discharge status: Home / Self Care | Payer: Medicare HMO | Source: Ambulatory Visit | Attending: *Deleted | Admitting: *Deleted

## 2020-08-19 DIAGNOSIS — Z13820 Encounter for screening for osteoporosis: Secondary | ICD-10-CM

## 2020-08-31 ENCOUNTER — Encounter: Payer: Self-pay | Admitting: Internal Medicine

## 2020-08-31 ENCOUNTER — Ambulatory Visit (INDEPENDENT_AMBULATORY_CARE_PROVIDER_SITE_OTHER): Payer: Medicare HMO | Admitting: Internal Medicine

## 2020-08-31 ENCOUNTER — Other Ambulatory Visit: Payer: Self-pay

## 2020-08-31 VITALS — BP 128/82 | HR 75 | Ht 62.0 in | Wt 219.6 lb

## 2020-08-31 DIAGNOSIS — G63 Polyneuropathy in diseases classified elsewhere: Secondary | ICD-10-CM

## 2020-08-31 DIAGNOSIS — E1142 Type 2 diabetes mellitus with diabetic polyneuropathy: Secondary | ICD-10-CM

## 2020-08-31 DIAGNOSIS — E1159 Type 2 diabetes mellitus with other circulatory complications: Secondary | ICD-10-CM

## 2020-08-31 DIAGNOSIS — Z794 Long term (current) use of insulin: Secondary | ICD-10-CM

## 2020-08-31 DIAGNOSIS — E1165 Type 2 diabetes mellitus with hyperglycemia: Secondary | ICD-10-CM | POA: Diagnosis not present

## 2020-08-31 MED ORDER — NOVOLOG FLEXPEN 100 UNIT/ML ~~LOC~~ SOPN: PEN_INJECTOR | SUBCUTANEOUS | 3 refills | Status: DC

## 2020-08-31 MED ORDER — DAPAGLIFLOZIN PROPANEDIOL 10 MG PO TABS: 10.0000 mg | ORAL_TABLET | Freq: Every day | ORAL | 3 refills | Status: DC

## 2020-08-31 MED ORDER — GABAPENTIN 100 MG PO CAPS: 200.0000 mg | ORAL_CAPSULE | Freq: Every day | ORAL | 3 refills | Status: DC

## 2020-08-31 MED ORDER — OZEMPIC (2 MG/DOSE) 8 MG/3ML ~~LOC~~ SOPN: 2.0000 mg | PEN_INJECTOR | SUBCUTANEOUS | 3 refills | Status: DC

## 2020-08-31 NOTE — Patient Instructions (Addendum)
-   Keep up the Good Work ! - Continue Tresiba 24 units ONCE  Daily  - Increase Ozempic 2 mg once weekly  - Continue Farxiga 10 mg, 1 tablet with Breakfast  -Novolog correctional insulin: Use the scale below to help guide you:   Blood sugar before meal Number of units to inject  Less than 170 0 unit  171 -  210 2 units  211-  250 3 units  251 -  290 4 units  291 -  330 5 units  331 -  370 6 units   Start Gabapentin 100 mg at Bedtime ( for neuropathy) , increase to 2 tablets at bedtime by the seconds week if symptoms persist  HOW TO TREAT LOW BLOOD SUGARS (Blood sugar LESS THAN 70 MG/DL) Please follow the RULE OF 15 for the treatment of hypoglycemia treatment (when your (blood sugars are less than 70 mg/dL)   STEP 1: Take 15 grams of carbohydrates when your blood sugar is low, which includes:  3-4 GLUCOSE TABS  OR 3-4 OZ OF JUICE OR REGULAR SODA OR ONE TUBE OF GLUCOSE GEL    STEP 2: RECHECK blood sugar in 15 MINUTES STEP 3: If your blood sugar is still low at the 15 minute recheck --> then, go back to STEP 1 and treat AGAIN with another 15 grams of carbohydrates.

## 2020-08-31 NOTE — Progress Notes (Signed)
Name: [name]  Age/ Sex: 70 y.o., female   MRN/ DOB: [id number], 10-08-1950     PCP: Everardo Beals, NP   Reason for Endocrinology Evaluation: Type 2 Diabetes Mellitus  Initial Endocrine Consultative Visit: 04/20/2019    PATIENT IDENTIFIER: [name] is a 70 y.o. female with a past medical history of DM, HTN, Dyslipidemia and CAD.. The patient has followed with Endocrinology clinic since 04/20/2019 for consultative assistance with management of her diabetes.  DIABETIC HISTORY:  [name] was diagnosed with T2DM many years ago, she is intolerant to Metformin, has been on Glipizide without reported intolerance. Her hemoglobin A1c has ranged from 7.8% in 2017, peaking at 9.5% in 2014.  On her initial visit to our clinic she had an A1c of 7.7%  , she was on MDI regimen and Ozempic. Marland Kitchen We switched levemir to tresiba and continued Ozempic and Novolog.    farxiga started 11/2019 SUBJECTIVE:   During the last visit (05/26/2020): A1c 7.0 %, We adjusted MDI regimen  and continued Ozempic and [place]      Today (08/31/2020): [name] is here for a follow up on diabetes.  She checks her blood sugar through the CGM The patient has had  hypoglycemic episodes since the last clinic visit, which typically occur 1 x /month  - most often occuring during the day. The patient is  symptomatic with these episodes.  Denies nausea and diarrhea  Has been having tingling of the feet for a while    HOME DIABETES REGIMEN:  Tresiba  24 units ONCE  Daily  Ozempic 1 mg once weekly  Farxiga 10 mg daily  CF: Novolog (BG-130/40)     Statin: Yes ACE-I/ARB: No      CONTINUOUS GLUCOSE MONITORING RECORD INTERPRETATION    Dates of Recording: 8/11-8/24/2022  Sensor description:dexcom  Results statistics:   CGM use % of time 93  Average and SD 148  Time in range   82     %  % Time Above 180 15  % Time above 250 2  % Time Below target <1      Glycemic patterns summary:  Hyperglycemia noted post breakfast, and to less extent post supper, BG's optimal overnight   Hyperglycemic episodes  post prandial   Hypoglycemic episodes occurred post bolus   Overnight periods: optimal        DIABETIC COMPLICATIONS: Microvascular complications:  Neuropathy Denies: CKD, retinopathy  Last Eye Exam: Completed 2019  Macrovascular complications:  CAD, CHF Denies: CVA, PVD   HISTORY:  Past Medical History:  Past Medical History:  Diagnosis Date   Abnormal LFTs    AICD (automatic cardioverter/defibrillator) present    Angina decubitus ([place]) 11/06/2012   Angina pectoris ([place])    Anxiety    Arthritis    Back pain    Bundle branch block 05/2016   CAD (coronary artery disease) 06/18/2010   CHF (congestive heart failure) (HCC)    Chronic combined systolic and diastolic CHF, NYHA class 3 ([place])    Chronic combined systolic and diastolic heart failure ([place])    Chronic systolic dysfunction of left ventricle 09/24/2012   Coronary artery disease    s/p CABG 2007   Depression    Dermal mycosis    Diabetes mellitus    type II   DKA (diabetic ketoacidoses) 01/12/2019   DVT (deep venous thrombosis) ([place]) 09/2015   bilateral   Edema, lower extremity    Glaucoma    Hyperlipidemia  Hypertension    Hypoglycemia 09/09/2012   Insomnia    Insulin dependent diabetes mellitus ([place]) 04/23/2012   Ischemic cardiomyopathy    EF 30-35%, s/p ICD 4/08 by Dr Leonia Reeves   Joint pain    LBBB (left bundle branch block) 09/24/2012   Morbid obesity ([place]) 11/06/2012   Obesity    Oxygen dependent 2 L   Pain in left knee    Peripheral neuropathy    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   LIMA to D2, SVG to ramus intermediate, sequential SVG to OM1-OM2, SVG to RCA with EVH via both legs    Sleep apnea    uses O2 at night   Tinea    Type 2 diabetes mellitus with hyperglycemia, with long-term current use of insulin ([place]) 04/20/2019   Uncontrolled diabetes mellitus ([place]) 02/2019   Ventricular  tachycardia ([place]) 01/16/15   sustained VT terminated with ATP, CL 340 msec   Vitamin B 12 deficiency 11/05/2018   Vitamin D deficiency 11/05/2018   Past Surgical History:  Past Surgical History:  Procedure Laterality Date   BI-VENTRICULAR IMPLANTABLE CARDIOVERTER DEFIBRILLATOR UPGRADE N/A 09/25/2012   Procedure: BI-VENTRICULAR IMPLANTABLE CARDIOVERTER DEFIBRILLATOR UPGRADE;  Surgeon: Coralyn Mark, MD;  Location: [facility] CATH LAB;  Service: Cardiovascular;  Laterality: N/A;   BREAST BIOPSY Right 04/05/2014   CARDIAC CATHETERIZATION     CARDIAC DEFIBRILLATOR PLACEMENT  4/08   by Dr Leonia Reeves (MDT)   COLONOSCOPY WITH PROPOFOL N/A 10/12/2016   Procedure: COLONOSCOPY WITH PROPOFOL;  Surgeon: Doran Stabler, MD;  Location: [facility];  Service: Gastroenterology;  Laterality: N/A;   CORONARY ARTERY BYPASS GRAFT  10/05/2005   by Dr Cyndia Bent   IMPLANTABLE CARDIOVERTER DEFIBRILLATOR IMPLANT  09/25/12   attempt of upgrade to CRT-D unsuccessful due to CS anatomy, SJM Unify Asaura device placed with LV port capped by Dr Rayann Heman   IR GENERIC HISTORICAL  10/03/2015   IR US GUIDE VASC ACCESS LEFT 10/03/2015 Jacqulynn Cadet, MD [facility]   IR GENERIC HISTORICAL  10/03/2015   IR VENO/EXT/UNI LEFT 10/03/2015 Jacqulynn Cadet, MD [facility]   IR GENERIC HISTORICAL  10/03/2015   IR VENOCAVAGRAM IVC 10/03/2015 Jacqulynn Cadet, MD [facility]   IR GENERIC HISTORICAL  10/03/2015   IR INFUSION THROMBOL VENOUS INITIAL (MS) 10/03/2015 Jacqulynn Cadet, MD [facility]   IR GENERIC HISTORICAL  10/03/2015   IR US GUIDE VASC ACCESS LEFT 10/03/2015 Jacqulynn Cadet, MD [facility]   IR GENERIC HISTORICAL  10/04/2015   IR THROMBECT VENO MECH MOD SED 10/04/2015 Sandi Mariscal, MD [facility]   IR GENERIC HISTORICAL  10/04/2015   IR TRANSCATH PLC STENT 1ST ART NOT LE CV CAR VERT CAR 10/04/2015 Sandi Mariscal, MD [facility]   IR GENERIC HISTORICAL  10/04/2015   IR THROMB F/U EVAL ART/VEN FINAL DAY (MS) 10/04/2015 Sandi Mariscal, MD  [facility]   IR GENERIC HISTORICAL  11/02/2015   IR RADIOLOGIST EVAL & MGMT 11/02/2015 Sandi Mariscal, MD [facility]   IR RADIOLOGIST EVAL & MGMT  04/26/2016   LEFT HEART CATH AND CORS/GRAFTS ANGIOGRAPHY N/A 01/19/2019   Procedure: LEFT HEART CATH AND CORS/GRAFTS ANGIOGRAPHY;  Surgeon: Belva Crome, MD;  Location: [facility];  Service: Cardiovascular;  Laterality: N/A;   Social History:  reports that she has never smoked. She has never used smokeless tobacco. She reports that she does not drink alcohol and does not use drugs. Family History:  Family History  Problem Relation Age of Onset   Diabetes  Mother 26       died - HTN   Stroke Mother    High blood pressure Mother    Sudden death Mother    Obesity Mother    Other Other        No early family hx of CAD     HOME MEDICATIONS: Allergies as of 08/31/2020       Reactions   Lipitor  [atorvastatin Calcium] Rash   Metformin And Related Itching, Swelling, Other (See Comments)   Leg pain & swelling in legs   Atorvastatin Itching, Rash   Levofloxacin Itching        Medication List        Accurate as of August 31, 2020  8:22 AM. If you have any questions, ask your nurse or doctor.          albuterol 108 (90 Base) MCG/ACT inhaler Commonly known as: VENTOLIN HFA Inhale 2 puffs into the lungs every 6 (six) hours as needed for wheezing or shortness of breath.   albuterol (2.5 MG/3ML) 0.083% nebulizer solution Commonly known as: PROVENTIL Take 2.5 mg by nebulization every 6 (six) hours as needed for wheezing or shortness of breath.   amitriptyline 10 MG tablet Commonly known as: ELAVIL Take 10 mg by mouth daily.   AMLODIPINE BENZOATE PO amlodipine   apixaban 5 MG Tabs tablet Commonly known as: ELIQUIS Take 1 tablet (5 mg total) by mouth 2 (two) times daily.   Baclofen 5 MG Tabs baclofen 5 mg tablet  TAKE 1 TABLET TWICE DAILY AS NEEDED  FOR  MUSCLE  SPASM   carvedilol 6.25 MG tablet Commonly known  as: COREG Take 1 tablet (6.25 mg total) by mouth 2 (two) times daily with a meal.   cyclobenzaprine 5 MG tablet Commonly known as: FLEXERIL Take 5 mg by mouth 3 (three) times daily as needed for muscle spasms.   dapagliflozin propanediol 10 MG Tabs tablet Commonly known as: Farxiga Take 1 tablet (10 mg total) by mouth daily.   Dexcom G6 Sensor Misc 1 Device by Does not apply route as directed.   Dexcom G6 Transmitter Misc 1 Device by Does not apply route as directed.   Droplet Pen Needles 32G X 4 MM Misc Generic drug: Insulin Pen Needle USE IN THE MORNING, AT NOON, IN THE EVENING, AND AT BEDTIME.   ferrous sulfate 325 (65 FE) MG EC tablet Take 325 mg by mouth daily.   fluconazole 150 MG tablet Commonly known as: DIFLUCAN Take 150 mg by mouth every 7 (seven) days.   furosemide 40 MG tablet Commonly known as: LASIX TAKE 1 TABLET EVERY DAY   GNP True Metrix Glucose Meter w/Device Kit True Metrix Glucose Meter kit  USE AS DIRECTED   isosorbide dinitrate 30 MG tablet Commonly known as: ISORDIL Take 30 mg by mouth every morning.   latanoprost 0.005 % ophthalmic solution Commonly known as: XALATAN Place 1 drop into both eyes at bedtime.   lisinopril 2.5 MG tablet Commonly known as: ZESTRIL TAKE 1 TABLET EVERY DAY   methocarbamol 500 MG tablet Commonly known as: ROBAXIN Take 1 tablet (500 mg total) by mouth 2 (two) times daily as needed for muscle spasms.   MUSCLE RELIEF EX Apply 1 application topically daily. For pain   nitroGLYCERIN 0.4 MG SL tablet Commonly known as: NITROSTAT Place 0.4 mg under the tongue every 5 (five) minutes as needed for chest pain.   NovoLOG FlexPen 100 UNIT/ML FlexPen Generic drug: insulin aspart Inject 6 Units  into the skin 3 (three) times daily with meals.   Ozempic (1 MG/DOSE) 4 MG/3ML Sopn Generic drug: Semaglutide (1 MG/DOSE) INJECT 1 MG TOTAL INTO THE SKIN ONCE A WEEK.   potassium chloride SA 20 MEQ tablet Commonly known as:  KLOR-CON Take 20 mEq by mouth daily.   rosuvastatin 20 MG tablet Commonly known as: CRESTOR Take 1 tablet (20 mg total) by mouth daily at 6 PM.   sulfamethoxazole-trimethoprim 800-160 MG tablet Commonly known as: BACTRIM DS sulfamethoxazole 800 mg-trimethoprim 160 mg tablet   traMADol 50 MG tablet Commonly known as: ULTRAM Take 50 mg by mouth 2 (two) times daily.   Tyler Aas FlexTouch 100 UNIT/ML FlexTouch Pen Generic drug: insulin degludec Inject 24 Units into the skin daily.   TRUEplus Lancets 33G Misc Apply 1 each topically 3 (three) times daily.   Vitamin D (Ergocalciferol) 1.25 MG (50000 UNIT) Caps capsule Commonly known as: DRISDOL Take 1 capsule (50,000 Units total) by mouth every 7 (seven) days.         OBJECTIVE:   Vital Signs: BP 128/82 (BP Location: Left Arm, Patient Position: Sitting, Cuff Size: Large)   Pulse 75   Ht 5' 2"  (1.575 m)   Wt 219 lb 9.6 oz (99.6 kg)   SpO2 98%   BMI 40.17 kg/m    Wt Readings from Last 3 Encounters:  08/08/20 220 lb 12.8 oz (100.2 kg)  05/26/20 223 lb 6 oz (101.3 kg)  04/12/20 225 lb (102.1 kg)     Exam: General: Pt appears well and is in NAD  Lungs: Clear with good BS bilat with no rales, rhonchi, or wheezes  Heart: RRR with normal S1 and S2 and no gallops; no murmurs; no rub  Extremities:  Trace pretibial edema.  Neuro: MS is good with appropriate affect, pt is alert and Ox3     DM Foot Exam 02/25/2020    The skin of the feet is  without sores or ulcerations, but has left 3rd toe scab as well as scattered ankle scabs The pedal pulses are undetectable  The sensation is decreased to a screening 5.07, 10 gram monofilament bilaterally ( decreased around the toes on the right and decreased mid arch on the left )      DATA REVIEWED:  Lab Results  Component Value Date   HGBA1C 7.0 (A) 05/26/2020   HGBA1C 6.1 (A) 02/25/2020   HGBA1C 5.8 (A) 11/19/2019   Lab Results  Component Value Date   LDLCALC 64 11/03/2018    CREATININE 0.87 08/08/2020    Lab Results  Component Value Date   CHOL 111 11/03/2018   HDL 30 (L) 11/03/2018   LDLCALC 64 11/03/2018   TRIG 87 11/03/2018   CHOLHDL 3.6 08/05/2017       Results for MACIE, BAUM (MRN [id number]) as of 05/26/2020 10:50  Ref. Range 04/12/2020 32:99  BASIC METABOLIC PANEL Unknown Rpt (A)  Sodium Latest Ref Range: 134 - 144 mmol/L 138  Potassium Latest Ref Range: 3.5 - 5.2 mmol/L 5.0  Chloride Latest Ref Range: 96 - 106 mmol/L 100  CO2 Latest Ref Range: 20 - 29 mmol/L 22  Glucose Latest Ref Range: 65 - 99 mg/dL 136 (H)  BUN Latest Ref Range: 8 - 27 mg/dL 20  Creatinine Latest Ref Range: 0.57 - 1.00 mg/dL 1.29 (H)  Calcium Latest Ref Range: 8.7 - 10.3 mg/dL 9.9  BUN/Creatinine Ratio Latest Ref Range: 12 - 28  16  eGFR Latest Ref Range: >59 mL/min/1.73 45 (L)  ASSESSMENT / PLAN / RECOMMENDATIONS:   1) Type 2 Diabetes Mellitus, Optimally controlled, With Neuropathic and  macrovascular complications - Most recent A1c of 7.0 %. Goal A1c < 7.0 %.    -Her A1c remains at goal -In review of her CGM download her BG went from 138 mg/DL this a.m. to 251 by drinking just a cup of coffee, I have advised the patient to discuss with daughter regarding the contents of coffee and to make sure she avoids sugar sweetened beverages. -I am going to increase her Ozempic as she is tolerating the 1 mg without any side effects   MEDICATIONS: -Continue Tresiba24 units ONCE  Daily  -Increase Ozempic 2  mg once weekly  -Continue Farxiga 10 mg, 1 tablet with Breakfast  -Correction factor: Novolog ( BG-130/40)  EDUCATION / INSTRUCTIONS: BG monitoring instructions: Patient is instructed to check her blood sugars 3 times a day, before meals Call [facility] if: BG persistently < 70 I reviewed the Rule of 15 for the treatment of hypoglycemia in detail with the patient. Literature supplied.   2) Diabetic complications:  Eye: Does not have known diabetic  retinopathy.  Neuro/ Feet: Does  have known diabetic peripheral neuropathy based on foot exam from 07/16/2019 Renal: Patient does not have known baseline CKD. She is not on an ACEI/ARB at present.  3) Peripheral Neuropathy :  -Her symptoms have been bothersome - Will start Gabapentin, cautioned against neuropathy    Medication Gabapentin 100 mg , 1 tablet QHS, may increase to 2 tabs QHS if symptoms persist     F/U in 3 months   Signed electronically by: Mack Guise, MD  [facility]  [address], [place] [address] Phone: [phone] FAX: [phone]   CC: Everardo Beals, NP [place] [address] Phone: [phone]  Fax: [phone]  Return to Endocrinology clinic as below: Future Appointments  Date Time Provider [place]  08/31/2020 10:30 AM Kadynce Bonds, Melanie Crazier, MD [facility] None  09/02/2020 10:30 AM [facility]  09/05/2020  7:05 AM [facility] DEVICE REMOTES [facility]  10/07/2020  1:45 PM Marzetta Board, DPM [facility]  10/24/2020  9:40 AM [facility] DEVICE REMOTES [facility]  01/23/2021  9:40 AM [facility] DEVICE REMOTES [facility]  02/10/2021  1:20 PM Jerline Pain, MD [facility]  04/24/2021  9:40 AM [facility] DEVICE REMOTES [facility]  07/24/2021  9:40 AM [facility] DEVICE REMOTES [facility]  10/23/2021  9:40 AM [facility] DEVICE REMOTES [facility]  01/22/2022  9:40 AM [facility] DEVICE REMOTES [facility]

## 2020-09-01 DIAGNOSIS — G63 Polyneuropathy in diseases classified elsewhere: Secondary | ICD-10-CM | POA: Insufficient documentation

## 2020-09-02 ENCOUNTER — Other Ambulatory Visit: Payer: Medicare HMO

## 2020-09-02 LAB — POCT GLYCOSYLATED HEMOGLOBIN (HGB A1C): Hemoglobin A1C: 6.8 % — AB (ref 4.0–5.6)

## 2020-09-13 NOTE — Progress Notes (Signed)
No ICM remote transmission received for 09/05/2020 and next ICM transmission scheduled for 09/21/2020.

## 2020-09-19 ENCOUNTER — Other Ambulatory Visit: Payer: Medicare HMO

## 2020-09-19 ENCOUNTER — Telehealth: Payer: Self-pay | Admitting: *Deleted

## 2020-09-19 ENCOUNTER — Other Ambulatory Visit: Payer: Self-pay

## 2020-09-19 DIAGNOSIS — M2142 Flat foot [pes planus] (acquired), left foot: Secondary | ICD-10-CM | POA: Diagnosis not present

## 2020-09-19 DIAGNOSIS — E1151 Type 2 diabetes mellitus with diabetic peripheral angiopathy without gangrene: Secondary | ICD-10-CM | POA: Diagnosis not present

## 2020-09-19 DIAGNOSIS — M2141 Flat foot [pes planus] (acquired), right foot: Secondary | ICD-10-CM | POA: Diagnosis not present

## 2020-09-19 NOTE — Telephone Encounter (Signed)
PUDS-09/19/20

## 2020-09-21 ENCOUNTER — Telehealth: Payer: Self-pay

## 2020-09-21 ENCOUNTER — Ambulatory Visit (INDEPENDENT_AMBULATORY_CARE_PROVIDER_SITE_OTHER): Payer: Medicare HMO

## 2020-09-21 DIAGNOSIS — I5042 Chronic combined systolic (congestive) and diastolic (congestive) heart failure: Secondary | ICD-10-CM

## 2020-09-21 DIAGNOSIS — Z95810 Presence of automatic (implantable) cardiac defibrillator: Secondary | ICD-10-CM | POA: Diagnosis not present

## 2020-09-21 NOTE — Telephone Encounter (Signed)
The patient states she will try to go get her monitor today or tomorrow to do her ICM transmission.

## 2020-09-22 ENCOUNTER — Telehealth: Payer: Self-pay | Admitting: Internal Medicine

## 2020-09-22 NOTE — Telephone Encounter (Signed)
Pt would like to inform Dr. Bonita Quin nurse that she just sent off her heart machine, she recently moved and husband brought it to her today.

## 2020-09-23 NOTE — Telephone Encounter (Signed)
Notified patient. Verbalized understanding.

## 2020-09-23 NOTE — Progress Notes (Signed)
EPIC Encounter for ICM Monitoring  Patient Name: [name] is a 70 y.o. female Date: 09/23/2020 Primary Care Physican: Everardo Beals, NP Primary Cardiologist: Skains/Weaver PA Electrophysiologist: Allred 08/31/2020 Office Weight: 219 lbs   AT/AF Burden: <1% (taking Eliquis)                                                          Transmission reviewed.    CorVue thoracic impedance suggesting normal fluid levels but was suggesting possible fluid accumulation from 9/2-9/10.   Prescribed: Furosemide 40 mg take 1 tablet daily Potassium 20 mEq take 1 tablet daily   Labs: 08/08/2020 Creatinine 0.87, BUN 14, Potassium 4.1, Sodium 141, GFR 72 04/12/2020 Creatinine 1.29, BUN 20 Potassium 5.0, Sodium 138 03/01/2020 Creatinine 1.59, BUN 23, Potassium 4.7, Sodium 136 02/16/2020 Creatinine 1.04, BUN 26, Potassium 4.4, Sodium 137, GFR 55-63  A complete set of results can be found in Results Review.   Recommendations:  No changes.   Next ICM clinic phone appointment due: 10/25/2020.     Next 91 day remote transmission due: 10/24/2020.     EP/Cardiology Office Visits:  02/10/2021.  Recall 01/16/2021 with Tommye Standard, PA.   Copy of ICM check sent to Dr. Rayann Heman.    3 month ICM trend: 09/22/2020.    1 Year ICM trend:       [name], RN 09/23/2020 3:14 PM

## 2020-10-07 ENCOUNTER — Ambulatory Visit (INDEPENDENT_AMBULATORY_CARE_PROVIDER_SITE_OTHER): Payer: Medicare HMO | Admitting: Podiatry

## 2020-10-07 ENCOUNTER — Encounter: Payer: Self-pay | Admitting: Podiatry

## 2020-10-07 ENCOUNTER — Other Ambulatory Visit: Payer: Self-pay

## 2020-10-07 DIAGNOSIS — B351 Tinea unguium: Secondary | ICD-10-CM

## 2020-10-07 DIAGNOSIS — M79674 Pain in right toe(s): Secondary | ICD-10-CM | POA: Diagnosis not present

## 2020-10-07 DIAGNOSIS — E1151 Type 2 diabetes mellitus with diabetic peripheral angiopathy without gangrene: Secondary | ICD-10-CM

## 2020-10-07 DIAGNOSIS — M79675 Pain in left toe(s): Secondary | ICD-10-CM

## 2020-10-07 NOTE — Progress Notes (Signed)
  Subjective:  Patient ID: [name], female    DOB: December 10, 1950,  MRN: [id number]  [name] presents to clinic today for at risk foot care. Pt has h/o NIDDM with PAD and painful thick toenails that are difficult to trim. Pain interferes with ambulation. Aggravating factors include wearing enclosed shoe gear. Pain is relieved with periodic professional debridement.  Patient states blood glucose was 158 mg/dl today.  PCP is Everardo Beals, NP , and last visit was 02/26/2020.   Allergies  Allergen Reactions   Lipitor  [Atorvastatin Calcium] Rash   Metformin And Related Itching, Swelling and Other (See Comments)    Leg pain & swelling in legs   Atorvastatin Itching and Rash   Levofloxacin Itching    Review of Systems: Negative except as noted in the HPI. Objective:   Constitutional Mer[place] Eileene Kisling is a pleasant 70 y.o. African American female, morbidly obese in NAD. AAO x 3.   Vascular Capillary refill time to digits <4 seconds b/l lower extremities. Nonpalpable pedal pulse(s) b/l lower extremities. Pedal hair absent. Lower extremity skin temperature gradient within normal limits. No pain with calf compression b/l. Trace edema noted b/l lower extremities. No cyanosis or clubbing noted.  Neurologic Normal speech. Oriented to person, place, and time. Protective sensation decreased with 10 gram monofilament b/l.  Dermatologic Pedal skin with normal turgor, texture and tone b/l lower extremities No open wounds b/l lower extremities No interdigital macerations b/l lower extremities Toenails 1-5 b/l elongated, discolored, dystrophic, thickened, crumbly with subungual debris and tenderness to dorsal palpation.  Orthopedic: Normal muscle strength 5/5 to all lower extremity muscle groups bilaterally. No pain crepitus or joint limitation noted with ROM b/l. Pes planus deformity noted b/l.    Radiographs: None Assessment:   1. Pain due to onychomycosis of toenails of both feet   2.  Type II diabetes mellitus with peripheral circulatory disorder (HCC)     Plan:  -No new findings. No new orders. -Continue diabetic foot care principles: inspect feet daily, monitor glucose as recommended by PCP and/or Endocrinologist, and follow prescribed diet per PCP, Endocrinologist and/or dietician. -Patient to continue soft, supportive shoe gear daily. -Toenails 1-5 b/l were debrided in length and girth with sterile nail nippers and dremel without iatrogenic bleeding.  -Patient to report any pedal injuries to medical professional immediately. -Patient/POA to call should there be question/concern in the interim.  Return in about 3 months (around 01/06/2021).  Marzetta Board, DPM

## 2020-10-10 ENCOUNTER — Other Ambulatory Visit: Payer: Self-pay | Admitting: Internal Medicine

## 2020-10-24 ENCOUNTER — Ambulatory Visit (INDEPENDENT_AMBULATORY_CARE_PROVIDER_SITE_OTHER): Payer: Medicare HMO

## 2020-10-24 DIAGNOSIS — I5042 Chronic combined systolic (congestive) and diastolic (congestive) heart failure: Secondary | ICD-10-CM

## 2020-10-25 ENCOUNTER — Ambulatory Visit (INDEPENDENT_AMBULATORY_CARE_PROVIDER_SITE_OTHER): Payer: Medicare HMO

## 2020-10-25 DIAGNOSIS — Z95810 Presence of automatic (implantable) cardiac defibrillator: Secondary | ICD-10-CM | POA: Diagnosis not present

## 2020-10-25 DIAGNOSIS — I5042 Chronic combined systolic (congestive) and diastolic (congestive) heart failure: Secondary | ICD-10-CM

## 2020-10-27 LAB — CUP PACEART REMOTE DEVICE CHECK
Battery Remaining Longevity: 26 mo
Battery Remaining Percentage: 25 %
Battery Voltage: 2.81 V
Brady Statistic AP VP Percent: 1 %
Brady Statistic AP VS Percent: 1 %
Brady Statistic AS VP Percent: 1 %
Brady Statistic AS VS Percent: 99 %
Brady Statistic RA Percent Paced: 1 %
Brady Statistic RV Percent Paced: 1 %
Date Time Interrogation Session: 20221017022553
HighPow Impedance: 37 Ohm
HighPow Impedance: 37 Ohm
Implantable Lead Implant Date: 20080423
Implantable Lead Implant Date: 20080423
Implantable Lead Implant Date: 20140918
Implantable Lead Location: 753858
Implantable Lead Location: 753859
Implantable Lead Location: 753860
Implantable Lead Model: 5076
Implantable Lead Model: 6947
Implantable Pulse Generator Implant Date: 20140918
Lead Channel Impedance Value: 350 Ohm
Lead Channel Impedance Value: 440 Ohm
Lead Channel Pacing Threshold Amplitude: 0.5 V
Lead Channel Pacing Threshold Amplitude: 0.5 V
Lead Channel Pacing Threshold Pulse Width: 0.5 ms
Lead Channel Pacing Threshold Pulse Width: 0.5 ms
Lead Channel Sensing Intrinsic Amplitude: 1.3 mV
Lead Channel Sensing Intrinsic Amplitude: 12 mV
Lead Channel Setting Pacing Amplitude: 2 V
Lead Channel Setting Pacing Amplitude: 2.5 V
Lead Channel Setting Pacing Pulse Width: 0.5 ms
Lead Channel Setting Sensing Sensitivity: 0.5 mV
Pulse Gen Serial Number: 7070007

## 2020-10-28 NOTE — Progress Notes (Signed)
EPIC Encounter for ICM Monitoring  Patient Name: [name] is a 70 y.o. female Date: 10/28/2020 Primary Care Physican: Everardo Beals, NP Primary Cardiologist: Skains/Weaver PA Electrophysiologist: Allred 10/21//2022 Weight: 219 lbs   AT/AF Burden: <1% (taking Eliquis)                                                          Transmission reviewed.    CorVue thoracic impedance suggesting normal fluid levels but was suggesting possible fluid accumulation from 9/2-9/10.   Prescribed: Furosemide 40 mg take 1 tablet daily Potassium 20 mEq take 1 tablet daily   Labs: 08/08/2020 Creatinine 0.87, BUN 14, Potassium 4.1, Sodium 141, GFR 72 04/12/2020 Creatinine 1.29, BUN 20 Potassium 5.0, Sodium 138 03/01/2020 Creatinine 1.59, BUN 23, Potassium 4.7, Sodium 136 02/16/2020 Creatinine 1.04, BUN 26, Potassium 4.4, Sodium 137, GFR 55-63  A complete set of results can be found in Results Review.   Recommendations:  No changes.   Next ICM clinic phone appointment due: 12/05/2020.     Next 91 day remote transmission due: 01/23/2021.     EP/Cardiology Office Visits:  02/10/2021 with Dr Marlou Porch.  Recall 01/16/2021 with Tommye Standard, PA.   Copy of ICM check sent to Dr. Rayann Heman.     3 month ICM trend: 10/24/2020.    1 Year ICM trend:       Jamie Billings, RN 10/28/2020 2:59 PM

## 2020-11-01 NOTE — Progress Notes (Signed)
Remote ICD transmission.   

## 2020-11-09 ENCOUNTER — Other Ambulatory Visit: Payer: Self-pay | Admitting: Internal Medicine

## 2020-11-23 ENCOUNTER — Other Ambulatory Visit: Payer: Self-pay | Admitting: Occupational Therapy

## 2020-11-24 ENCOUNTER — Other Ambulatory Visit: Payer: Self-pay

## 2020-11-24 NOTE — Patient Outreach (Signed)
Aging [facility]fully Program  OT Follow-Up Visit  11/24/2020  Bernita Beckstrom September 04, 1950 [id number]  Visit:  4- Fourth Visit  Start Time:  0105 End Time:  0135 Total Minutes:  30  Readiness to Change Score :  Readiness to Change Score: 10   Durable Medical Equipment: Durable Medical Equipment: Other (upright rollator) Durable Medical Equipment Distribution Date: 11/23/20  Patient Education: Education Provided: Yes Education Details: Tips for Aging at [employer]) Educated: Patient Comprehension:  (reported she would read over it and we would discuss it)  Goals:   Goals Addressed             This Visit's Progress    Patient Stated   On track    She would like to feel more safe with her mobility inside and outside her house. (upright rollator would be beneficial)  FUNCTIONAL MOBILITY  Target Problem Area: Ambulation, pain  Why Problem May Occur: Decreased balance, arthritis  Target Goal: Mod I with ambulation with more upright posture   STRATEGIES Saving Your Energy: DO: DON'T:  Take breaks  Don't rush  Sit on your upright rollator seat as needed   Remove tripping hazards   Modifying your home environment and making it safe: DO: DON'T:  Make paths clutter free   Remove or strongly secure throw rugs    Provide adequate lighting Use dim lights or lights that cast a lot of shadows  Simplifying the way you set up tasks or daily routines: DO: DON'T:  Use rollator seat to move items from point A to point B Try to carry items in your hands as you use the upright rollator to walk   Practice It is important to practice the strategies so we can determine if they will be effective in helping to reach your goal. Follow these specific recommendations: 1.Lock brakes before sitting down on upright rollator. Even more so to make it safer push it up against a non-movable surface (ie: wall) before you sit down on it (in addition to having locked the brakes  If a  strategy does not work the first time, try it again and again (and maybe again). We may make some changes over the next few sessions, based on how they work.  Golden Circle, OTR/L      11/23/2020     Patient Stated   On track    Pt would like to feel more safe up and down from her toilet (grab bar would help with this)     COMPLETED: Patient Stated       Safety in and out of her house (ramp/deck at front door and ramp v. rail at back door is recommended)     COMPLETED: Patient Stated       Would like to be more independent with her basic ADLs (reacher, sock aid, long sponge, and ted hose donner will help with this, as well as possibly walk in shower stall v. Tub bench) IN PROGRESS  ACTION PLANNING - DRESSING/BATHING Target Problem Area: Difficulty with bathing and dressing  Why Problem May Occur: Difficulty getting to her lower body      Target Goal: Independence with lower body bathing and dressing  STRATEGIES Saving Your Energy: DO: DON'T:  Sit down to do tasks - can sit on toilet Stand too long while dressing  Use appropriate adaptive equipment:  long handled sponge, sock aid, reacher, ted hose donner   Keep all items you'll need within easy reach   Simplifying the way  you set up tasks or daily routines: DO: DON'T:  Wear sturdy shoes that grip the floor   Gather all items before getting started   Wear clothing that is easily manipulated Wear loose, flowing clothes   PRACTICE It is important to practice the strategies so we can determine if they will be effective in helping to reach your goal. Follow these specific recommendations: 1.Try and get all lower body clothing on/started with adaptive equipment before standing up  (this saves energy)  2. Always sit to do/start lower body dressing and to bath lower legs and feet  If a strategy does not work the first time, try it again and again (and maybe again). We may make some changes over the next few sessions, based on how  they work.  Golden Circle, OTR/L        12/09/2019   ACTION PLANNING Target Problem Area: Unable to don/doff ted hose by herself   Why Problem May Occur: Cannot reach feet easily and ted hose are tight to put on     Target Goal: Independence with doffing and donning ted hose   STRATEGIES Simplifying the way you set up tasks or daily routines: DO: DON'T:  Use dressing stick to take off compression hose as needed   Do use  inside out technique to put on compression hose   Sit while completing tasks above    Practice It is important to practice the strategies so we can determine if they will be effective in helping to reach your goal. Follow these specific recommendations: 1. Keep trying inside out technique for putting on compression hose 2.Keep trying to use dressing stick as needed to take off compression hose  If a strategy does not work the first time, try it again and again (and maybe again). We may make some changes over the next few sessions, based on how they work.   Golden Circle, OTR/L      04/09/2020          Post Clinical Reasoning: Client Action (Goal) One Interventions: Client provided with upright rollator Did Client Try?: Yes Targeted Problem Area Status: A Lot Better Clinician View Of Client Situation:: Ms. Siebers is doing well. She is currenlty living with one of her daughters.She wants to move back into her house but per her family she cannot yet due to disarray after modifications. She hopes to go look at it herself soon. Client View Of His/Her Situation:: She feels she is doing. well. She was happy to get the upright rollator. She really wishes she was back at home. Next Visit Plan:: Check in on her after she gets back in her home to make sure there is nothing else she needs that we did not foresee prior to modifications.   Golden Circle, OTR/L Aging [facility]fully [phone]

## 2020-11-30 ENCOUNTER — Emergency Department (HOSPITAL_COMMUNITY)
Admission: EM | Admit: 2020-11-30 | Discharge: 2020-11-30 | Disposition: A | Discharge status: Home / Self Care | Payer: Medicare HMO | Attending: Emergency Medicine | Admitting: Emergency Medicine

## 2020-11-30 ENCOUNTER — Other Ambulatory Visit: Payer: Self-pay

## 2020-11-30 ENCOUNTER — Emergency Department (HOSPITAL_COMMUNITY): Payer: Medicare HMO

## 2020-11-30 ENCOUNTER — Encounter (HOSPITAL_COMMUNITY): Payer: Self-pay | Admitting: Emergency Medicine

## 2020-11-30 DIAGNOSIS — Z20822 Contact with and (suspected) exposure to covid-19: Secondary | ICD-10-CM | POA: Diagnosis not present

## 2020-11-30 DIAGNOSIS — E1142 Type 2 diabetes mellitus with diabetic polyneuropathy: Secondary | ICD-10-CM | POA: Insufficient documentation

## 2020-11-30 DIAGNOSIS — N83202 Unspecified ovarian cyst, left side: Secondary | ICD-10-CM

## 2020-11-30 DIAGNOSIS — R110 Nausea: Secondary | ICD-10-CM | POA: Diagnosis present

## 2020-11-30 DIAGNOSIS — E1140 Type 2 diabetes mellitus with diabetic neuropathy, unspecified: Secondary | ICD-10-CM | POA: Insufficient documentation

## 2020-11-30 DIAGNOSIS — Z28310 Unvaccinated for covid-19: Secondary | ICD-10-CM | POA: Insufficient documentation

## 2020-11-30 DIAGNOSIS — Z95810 Presence of automatic (implantable) cardiac defibrillator: Secondary | ICD-10-CM | POA: Diagnosis not present

## 2020-11-30 DIAGNOSIS — Z794 Long term (current) use of insulin: Secondary | ICD-10-CM | POA: Diagnosis not present

## 2020-11-30 DIAGNOSIS — Z955 Presence of coronary angioplasty implant and graft: Secondary | ICD-10-CM | POA: Insufficient documentation

## 2020-11-30 DIAGNOSIS — J101 Influenza due to other identified influenza virus with other respiratory manifestations: Secondary | ICD-10-CM | POA: Diagnosis not present

## 2020-11-30 DIAGNOSIS — Z7984 Long term (current) use of oral hypoglycemic drugs: Secondary | ICD-10-CM | POA: Insufficient documentation

## 2020-11-30 DIAGNOSIS — I2510 Atherosclerotic heart disease of native coronary artery without angina pectoris: Secondary | ICD-10-CM | POA: Insufficient documentation

## 2020-11-30 DIAGNOSIS — Z7901 Long term (current) use of anticoagulants: Secondary | ICD-10-CM | POA: Diagnosis not present

## 2020-11-30 DIAGNOSIS — E1110 Type 2 diabetes mellitus with ketoacidosis without coma: Secondary | ICD-10-CM | POA: Diagnosis not present

## 2020-11-30 DIAGNOSIS — I110 Hypertensive heart disease with heart failure: Secondary | ICD-10-CM | POA: Diagnosis not present

## 2020-11-30 DIAGNOSIS — N3001 Acute cystitis with hematuria: Secondary | ICD-10-CM

## 2020-11-30 DIAGNOSIS — R42 Dizziness and giddiness: Secondary | ICD-10-CM | POA: Insufficient documentation

## 2020-11-30 DIAGNOSIS — E538 Deficiency of other specified B group vitamins: Secondary | ICD-10-CM | POA: Insufficient documentation

## 2020-11-30 DIAGNOSIS — Z79899 Other long term (current) drug therapy: Secondary | ICD-10-CM | POA: Insufficient documentation

## 2020-11-30 DIAGNOSIS — I5042 Chronic combined systolic (congestive) and diastolic (congestive) heart failure: Secondary | ICD-10-CM | POA: Insufficient documentation

## 2020-11-30 LAB — COMPREHENSIVE METABOLIC PANEL
ALT: 29 U/L (ref 0–44)
AST: 55 U/L — ABNORMAL HIGH (ref 15–41)
Albumin: 3.6 g/dL (ref 3.5–5.0)
Alkaline Phosphatase: 55 U/L (ref 38–126)
Anion gap: 10 (ref 5–15)
BUN: 21 mg/dL (ref 8–23)
CO2: 22 mmol/L (ref 22–32)
Calcium: 9.4 mg/dL (ref 8.9–10.3)
Chloride: 103 mmol/L (ref 98–111)
Creatinine, Ser: 1.29 mg/dL — ABNORMAL HIGH (ref 0.44–1.00)
GFR, Estimated: 45 mL/min — ABNORMAL LOW (ref >60)
Glucose, Bld: 190 mg/dL — ABNORMAL HIGH (ref 70–99)
Potassium: 3.6 mmol/L (ref 3.5–5.1)
Sodium: 135 mmol/L (ref 135–145)
Total Bilirubin: 1.4 mg/dL — ABNORMAL HIGH (ref 0.3–1.2)
Total Protein: 7.2 g/dL (ref 6.5–8.1)

## 2020-11-30 LAB — RETICULOCYTES
Immature Retic Fract: 28.8 % — ABNORMAL HIGH (ref 2.3–15.9)
RBC.: 2.45 MIL/uL — ABNORMAL LOW (ref 3.87–5.11)
Retic Count, Absolute: 46.3 10*3/uL (ref 19.0–186.0)
Retic Ct Pct: 1.9 % (ref 0.4–3.1)

## 2020-11-30 LAB — CBC WITH DIFFERENTIAL/PLATELET
Abs Immature Granulocytes: 0.02 10*3/uL (ref 0.00–0.07)
Basophils Absolute: 0 10*3/uL (ref 0.0–0.1)
Basophils Relative: 1 %
Eosinophils Absolute: 0 10*3/uL (ref 0.0–0.5)
Eosinophils Relative: 0 %
HCT: 30.1 % — ABNORMAL LOW (ref 36.0–46.0)
Hemoglobin: 10.6 g/dL — ABNORMAL LOW (ref 12.0–15.0)
Immature Granulocytes: 1 %
Lymphocytes Relative: 27 %
Lymphs Abs: 0.5 10*3/uL — ABNORMAL LOW (ref 0.7–4.0)
MCH: 42.9 pg — ABNORMAL HIGH (ref 26.0–34.0)
MCHC: 35.2 g/dL (ref 30.0–36.0)
MCV: 121.9 fL — ABNORMAL HIGH (ref 80.0–100.0)
Monocytes Absolute: 0.1 10*3/uL (ref 0.1–1.0)
Monocytes Relative: 5 %
Neutro Abs: 1.3 10*3/uL — ABNORMAL LOW (ref 1.7–7.7)
Neutrophils Relative %: 66 %
Platelets: 108 10*3/uL — ABNORMAL LOW (ref 150–400)
RBC: 2.47 MIL/uL — ABNORMAL LOW (ref 3.87–5.11)
RDW: 15.1 % (ref 11.5–15.5)
Smear Review: NORMAL
WBC: 2 10*3/uL — ABNORMAL LOW (ref 4.0–10.5)
nRBC: 1.5 % — ABNORMAL HIGH (ref 0.0–0.2)

## 2020-11-30 LAB — URINALYSIS, ROUTINE W REFLEX MICROSCOPIC
Bilirubin Urine: NEGATIVE
Glucose, UA: >=500 mg/dL — AB
Ketones, ur: 5 mg/dL — AB
Nitrite: NEGATIVE
Protein, ur: NEGATIVE mg/dL
Specific Gravity, Urine: 1.002 — ABNORMAL LOW (ref 1.005–1.030)
pH: 6 (ref 5.0–8.0)

## 2020-11-30 LAB — TROPONIN I (HIGH SENSITIVITY)
Troponin I (High Sensitivity): 17 ng/L (ref <18)
Troponin I (High Sensitivity): 15 ng/L (ref <18)

## 2020-11-30 LAB — VITAMIN B12: Vitamin B-12: 110 pg/mL — ABNORMAL LOW (ref 180–914)

## 2020-11-30 LAB — IRON AND TIBC
Iron: 29 ug/dL (ref 28–170)
Saturation Ratios: 11 % (ref 10.4–31.8)
TIBC: 266 ug/dL (ref 250–450)
UIBC: 237 ug/dL

## 2020-11-30 LAB — LIPASE, BLOOD: Lipase: 36 U/L (ref 11–51)

## 2020-11-30 LAB — FERRITIN: Ferritin: 441 ng/mL — ABNORMAL HIGH (ref 11–307)

## 2020-11-30 LAB — RESP PANEL BY RT-PCR (FLU A&B, COVID) ARPGX2
Influenza A by PCR: POSITIVE — AB
Influenza B by PCR: NEGATIVE
SARS Coronavirus 2 by RT PCR: NEGATIVE

## 2020-11-30 LAB — FOLATE: Folate: 20.9 ng/mL (ref >5.9)

## 2020-11-30 MED ORDER — CEPHALEXIN 500 MG PO CAPS: 500.0000 mg | ORAL_CAPSULE | Freq: Four times a day (QID) | ORAL | 0 refills | Status: AC

## 2020-11-30 MED ORDER — CEPHALEXIN 250 MG PO CAPS
500.0000 mg | ORAL_CAPSULE | Freq: Once | ORAL | Status: AC
  Administered 2020-11-30: 500 mg via ORAL
  Filled 2020-11-30: qty 2

## 2020-11-30 MED ORDER — HYDROCODONE-ACETAMINOPHEN 5-325 MG PO TABS
1.0000 | ORAL_TABLET | Freq: Once | ORAL | Status: AC
  Administered 2020-11-30: 1 via ORAL
  Filled 2020-11-30: qty 1

## 2020-11-30 MED ORDER — LACTATED RINGERS IV BOLUS: 1000.0000 mL | Freq: Once | INTRAVENOUS | Status: DC

## 2020-11-30 MED ORDER — LACTATED RINGERS IV BOLUS: 500.0000 mL | Freq: Once | INTRAVENOUS | Status: DC

## 2020-11-30 NOTE — ED Provider Notes (Signed)
I personally evaluated the patient during the encounter and completed a history, physical, procedures, medical decision making to contribute to the overall care of the patient and decision making for the patient briefly, the patient is a 70 y.o. female with diarrhea and cough.  Has not been feeling well for the last 2 days.  Tested positive for influenza A.  Lab work otherwise unremarkable.  She does have likely viral suppression of her white count.  She has mild anemia with an MCV of 120 which is likely secondary to B12 deficiency which she has a history of.  She is no longer taking her B12 shots.  She has no significant abdominal pain on my examination.  CT scan of the abdomen pelvis is overall unremarkable.  No acute findings.  She has been able to drink water while being here.  She is awaiting some IV fluids, urinalysis.  But suspect that she will be discharged with medicine for symptomatic care.  This chart was dictated using voice recognition software.  Despite best efforts to proofread,  errors can occur which can change the documentation meaning.    EKG Interpretation  Date/Time:  Wednesday November 30 2020 13:49:30 EST Ventricular Rate:  82 PR Interval:  188 QRS Duration: 150 QT Interval:  458 QTC Calculation: 535 R Axis:   25 Text Interpretation: Normal sinus rhythm Non-specific intra-ventricular conduction block Abnormal ECG Confirmed by Lennice Sites ([id number]) on 11/30/2020 6:09:56 PM           Lennice Sites, DO 11/30/20 1919

## 2020-11-30 NOTE — ED Notes (Signed)
Got patient cleaned up a new brief placed a eternal cath patient is resting with family at bedside and call bell in reach

## 2020-11-30 NOTE — ED Triage Notes (Signed)
Per [facility] pt coming from home c/o feeling lightheaded and "off" x 2 days. Patient reports mid abdominal pain for a few days.

## 2020-11-30 NOTE — ED Notes (Signed)
Patient transported to CT 

## 2020-11-30 NOTE — ED Provider Notes (Signed)
Emergency Medicine Provider Triage Evaluation Note  [name] , a 70 y.o. female  was evaluated in triage.  Pt complains of NVD, abd pain, lightheadedness.  Review of Systems  Positive: Nvd, lightheadedness, abd pain Negative: fever  Physical Exam  BP (!) 124/58 (BP Location: Right Arm)   Pulse 81   Temp 99.5 F (37.5 C)   Resp 18   SpO2 97%  Gen:   Awake, no distress   Resp:  Normal effort  MSK:   Moves extremities without difficulty  Other:  Abd soft  Medical Decision Making  Medically screening exam initiated at 1:54 PM.  Appropriate orders placed.  [name] was informed that the remainder of the evaluation will be completed by another provider, this initial triage assessment does not replace that evaluation, and the importance of remaining in the ED until their evaluation is complete.     Rodney Booze, PA-C 11/30/20 1354    Lorelle Gibbs, DO 11/30/20 1627

## 2020-11-30 NOTE — ED Notes (Signed)
Got patient into a gown on the monitor cleaned patient up placed a brief patient is resting with call bell in reach

## 2020-11-30 NOTE — Discharge Instructions (Addendum)
I am prescribing you an antibiotic called Keflex for your UTI.  Please take this 4 times a day for the next 7 days.  Do not stop taking this early.  Your B12 levels are low today.  Please follow-up with your regular doctor regarding this.  You will likely need to restart B12 injections.  He also found a cyst on your left ovary.  Please follow-up with your regular doctor and have an ultrasound obtained for this.  If you develop any new or worsening symptoms please come back to the emergency department immediately.

## 2020-11-30 NOTE — ED Provider Notes (Addendum)
[facility] EMERGENCY DEPARTMENT Provider Note   CSN: [id number] Arrival date & time: 11/30/20  1344     History Chief Complaint  [name] presents with   Dizziness    [name] is a 70 y.o. female.  HPI [name] is a 70 year old female with a history of CAD, CHF with an AICD in place, diabetes mellitus, vitamin B12 deficiency, who presents to the emergency department due to lightheadedness, nausea, diarrhea that began this morning.  [name] states that she had decreased appetite last night but otherwise felt normal.  She woke up today with her symptoms.  Denies dizziness but states that when she stands up that she becomes increasingly lightheaded.  Denies syncope or falls.  No fevers, chills, chest pain, shortness of breath, rhinorrhea, sore throat, though [name] does note a mild cough.  [name] also complains of mild pain in the periumbilical region of her abdomen.  No hematochezia.  [name] states she has not been vaccinated for the flu or COVID-19 but denies any known previous COVID-19 infections.    Past Medical History:  Diagnosis Date   Abnormal LFTs    AICD (automatic cardioverter/defibrillator) present    Angina decubitus ([place]) 11/06/2012   Angina pectoris ([place])    Anxiety    Arthritis    Back pain    Bundle branch block 05/2016   CAD (coronary artery disease) 06/18/2010   CHF (congestive heart failure) (HCC)    Chronic combined systolic and diastolic CHF, NYHA class 3 (HCC)    Chronic combined systolic and diastolic heart failure (HCC)    Chronic systolic dysfunction of left ventricle 09/24/2012   Coronary artery disease    s/p CABG 2007   Depression    Dermal mycosis    Diabetes mellitus    type II   DKA (diabetic ketoacidoses) 01/12/2019   DVT (deep venous thrombosis) ([place]) 09/2015   bilateral   Edema, lower extremity    Glaucoma    Hyperlipidemia    Hypertension    Hypoglycemia 09/09/2012   Insomnia    Insulin dependent diabetes  mellitus ([place]) 04/23/2012   Ischemic cardiomyopathy    EF 30-35%, s/p ICD 4/08 by Dr Leonia Reeves   Joint pain    LBBB (left bundle branch block) 09/24/2012   Morbid obesity ([place]) 11/06/2012   Obesity    Oxygen dependent 2 L   Pain in left knee    Peripheral neuropathy    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   LIMA to D2, SVG to ramus intermediate, sequential SVG to OM1-OM2, SVG to RCA with EVH via both legs    Sleep apnea    uses O2 at night   Tinea    Type 2 diabetes mellitus with hyperglycemia, with long-term current use of insulin ([place]) 04/20/2019   Uncontrolled diabetes mellitus 02/2019   Ventricular tachycardia 01/16/15   sustained VT terminated with ATP, CL 340 msec   Vitamin B 12 deficiency 11/05/2018   Vitamin D deficiency 11/05/2018    [name] Active Problem List   Diagnosis Date Noted   Polyneuropathy associated with underlying disease ([place]) 09/01/2020   Acute pansinusitis 03/18/2020   Diabetic peripheral neuropathy ([place]) 03/18/2020   Urine finding 03/18/2020   Aortic atherosclerosis ([place]) 01/13/2020   Diabetes mellitus with coincident hypertension ([place]) 01/13/2020   Type 2 diabetes mellitus with diabetic polyneuropathy, with long-term current use of insulin ([place]) 07/16/2019   Type 2 diabetes mellitus with hyperglycemia, with long-term current use of insulin ([place]) 04/20/2019   Diabetes  mellitus ([place]) 04/20/2019   Uncontrolled diabetes mellitus 02/20/2019   Pressure injury of skin 02/20/2019   Demand ischemia (HCC)    Abnormal LFTs    Acute pancreatitis 01/12/2019   DKA (diabetic ketoacidoses) 01/12/2019   Vitamin D deficiency 11/05/2018   Vitamin B 12 deficiency 11/05/2018   Osteoarthritis of knee 07/02/2018   Tinea    Sleep apnea    Oxygen dependent    Obesity    Insomnia    Hyperlipidemia    Dermal mycosis    Depression    Coronary artery disease    CHF (congestive heart failure) (HCC)    Arthritis    Anxiety    ICD (implantable cardioverter-defibrillator) in place     Heme positive stool    Benign neoplasm of transverse colon    Bundle branch block 05/08/2016   Left leg pain    DVT (deep venous thrombosis) ([place]) 09/30/2015   Syncope 03/11/2015   AKI (acute kidney injury) ([place]) 03/11/2015   Hypotension 03/11/2015   Syncope and collapse 03/11/2015   Ventricular tachycardia 01/17/2015   Pain in the chest    SOB (shortness of breath)    Thyroid nodule    CAP (community acquired pneumonia)    Chest pain 05/26/2014   Right flank pain 05/26/2014   Right flank discomfort    Chronic combined systolic and diastolic heart failure (HCC)    Angina pectoris ([place])    Morbid obesity ([place]) 11/06/2012   Angina decubitus ([place]) 11/06/2012   LBBB (left bundle branch block) 12/45/8099   Chronic systolic dysfunction of left ventricle 09/24/2012   Hypoglycemia 09/09/2012   HLD (hyperlipidemia) 04/23/2012   Insulin dependent diabetes mellitus ([place]) 04/23/2012   Acute renal failure ([place]) 03/10/2012   Ischemic cardiomyopathy 06/18/2010   CAD (coronary artery disease) 06/18/2010   Hypertension 06/18/2010   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    Past Surgical History:  Procedure Laterality Date   BI-VENTRICULAR IMPLANTABLE CARDIOVERTER DEFIBRILLATOR UPGRADE N/A 09/25/2012   Procedure: BI-VENTRICULAR IMPLANTABLE CARDIOVERTER DEFIBRILLATOR UPGRADE;  Surgeon: Coralyn Mark, MD;  Location: [facility] CATH LAB;  Service: Cardiovascular;  Laterality: N/A;   BREAST BIOPSY Right 04/05/2014   CARDIAC CATHETERIZATION     CARDIAC DEFIBRILLATOR PLACEMENT  4/08   by Dr Leonia Reeves (MDT)   COLONOSCOPY WITH PROPOFOL N/A 10/12/2016   Procedure: COLONOSCOPY WITH PROPOFOL;  Surgeon: Doran Stabler, MD;  Location: [facility] ENDOSCOPY;  Service: Gastroenterology;  Laterality: N/A;   CORONARY ARTERY BYPASS GRAFT  10/05/2005   by Dr Cyndia Bent   IMPLANTABLE CARDIOVERTER DEFIBRILLATOR IMPLANT  09/25/12   attempt of upgrade to CRT-D unsuccessful due to CS anatomy, SJM Unify [place] device placed with LV port capped by  Dr Rayann Heman   IR GENERIC HISTORICAL  10/03/2015   IR US GUIDE VASC ACCESS LEFT 10/03/2015 Jacqulynn Cadet, MD [facility]   IR GENERIC HISTORICAL  10/03/2015   IR VENO/EXT/UNI LEFT 10/03/2015 Jacqulynn Cadet, MD [facility]   IR GENERIC HISTORICAL  10/03/2015   IR VENOCAVAGRAM IVC 10/03/2015 Jacqulynn Cadet, MD [facility]   IR GENERIC HISTORICAL  10/03/2015   IR INFUSION THROMBOL VENOUS INITIAL (MS) 10/03/2015 Jacqulynn Cadet, MD [facility]   IR GENERIC HISTORICAL  10/03/2015   IR US GUIDE VASC ACCESS LEFT 10/03/2015 Jacqulynn Cadet, MD [facility]   IR GENERIC HISTORICAL  10/04/2015   IR THROMBECT VENO [facility] MOD SED 10/04/2015 Sandi Mariscal, MD [facility]   IR GENERIC HISTORICAL  10/04/2015   IR TRANSCATH PLC STENT 1ST ART  NOT LE CV CAR VERT CAR 10/04/2015 Sandi Mariscal, MD [facility]   IR GENERIC HISTORICAL  10/04/2015   IR THROMB F/U EVAL ART/VEN FINAL DAY (MS) 10/04/2015 Sandi Mariscal, MD [facility]   IR GENERIC HISTORICAL  11/02/2015   IR RADIOLOGIST EVAL & MGMT 11/02/2015 Sandi Mariscal, MD [facility]   IR RADIOLOGIST EVAL & MGMT  04/26/2016   LEFT HEART CATH AND CORS/GRAFTS ANGIOGRAPHY N/A 01/19/2019   Procedure: LEFT HEART CATH AND CORS/GRAFTS ANGIOGRAPHY;  Surgeon: Belva Crome, MD;  Location: [facility] CV LAB;  Service: Cardiovascular;  Laterality: N/A;     OB History     Gravida  4   Para  4   Term      Preterm      AB      Living         SAB      IAB      Ectopic      Multiple      Live Births              Family History  Problem Relation Age of Onset   Diabetes Mother 18       died - HTN   Stroke Mother    High blood pressure Mother    Sudden death Mother    Obesity Mother    Other Other        No early family hx of CAD    Social History   Tobacco Use   Smoking status: Never   Smokeless tobacco: Never  Vaping Use   Vaping Use: Never used  Substance Use Topics   Alcohol use: No   Drug use: No    Home Medications Prior to  Admission medications   Medication Sig Start Date End Date Taking? Authorizing Provider  cephALEXin (KEFLEX) 500 MG capsule Take 1 capsule (500 mg total) by mouth 4 (four) times daily for 7 days. 11/30/20 12/07/20 Yes Rayna Sexton, PA-C  albuterol (PROVENTIL HFA;VENTOLIN HFA) 108 (90 BASE) MCG/ACT inhaler Inhale 2 puffs into the lungs every 6 (six) hours as needed for wheezing or shortness of breath.    [provider]  albuterol (PROVENTIL) (2.5 MG/3ML) 0.083% nebulizer solution Take 2.5 mg by nebulization every 6 (six) hours as needed for wheezing or shortness of breath.  09/30/15   [provider]  alendronate (FOSAMAX) 10 MG tablet alendronate 10 mg tablet    [provider]  amitriptyline (ELAVIL) 10 MG tablet Take 10 mg by mouth daily. 07/25/16   [provider]  AMLODIPINE BENZOATE PO amlodipine    [provider]  apixaban (ELIQUIS) 5 MG TABS tablet Take 1 tablet (5 mg total) by mouth 2 (two) times daily. 03/24/20   Angelia Mould, MD  Baclofen 5 MG TABS baclofen 5 mg tablet  TAKE 1 TABLET TWICE DAILY AS NEEDED  FOR  MUSCLE  SPASM    [provider]  Blood Glucose Monitoring Suppl (GNP TRUE METRIX GLUCOSE METER) w/Device KIT True Metrix Glucose Meter kit  USE AS DIRECTED    [provider]  Calcium Carb-Cholecalciferol 600-400 MG-UNIT TABS calcium carbonate 600 mg-vitamin D3 10 mcg (400 unit) tablet  Take 1 tablet every day by oral route for 90 days.    [provider]  Capsaicin (MUSCLE RELIEF EX) Apply 1 application topically daily. For pain 08/09/16   [provider]  carvedilol (COREG) 6.25 MG tablet Take 1 tablet (6.25 mg total) by mouth 2 (  two) times daily with a meal. 02/22/19   Eulogio Bear U, DO  Continuous Blood Gluc Sensor (DEXCOM G6 SENSOR) MISC 1 Device by Does not apply route as directed. 02/25/20   Shamleffer, Melanie Crazier, MD  Continuous Blood Gluc Transmit (DEXCOM G6 TRANSMITTER) MISC  1 Device by Does not apply route as directed. 02/25/20   Shamleffer, Melanie Crazier, MD  cyclobenzaprine (FLEXERIL) 5 MG tablet Take 5 mg by mouth 3 (three) times daily as needed for muscle spasms.    [provider]  dapagliflozin propanediol (FARXIGA) 10 MG TABS tablet Take 1 tablet (10 mg total) by mouth daily. 08/31/20   Shamleffer, Melanie Crazier, MD  DROPLET PEN NEEDLES 32G X 4 MM MISC USE IN THE MORNING, AT NOON, IN THE EVENING, AND AT BEDTIME. 03/21/20   Shamleffer, Melanie Crazier, MD  ferrous sulfate 325 (65 FE) MG EC tablet Take 325 mg by mouth daily.     [provider]  fluconazole (DIFLUCAN) 150 MG tablet Take 150 mg by mouth every 7 (seven) days.    Millsaps, Joelene Millin, NP  furosemide (LASIX) 40 MG tablet TAKE 1 TABLET EVERY DAY 05/25/20   Jerline Pain, MD  gabapentin (NEURONTIN) 100 MG capsule Take 2 capsules (200 mg total) by mouth at bedtime. 08/31/20   Shamleffer, Melanie Crazier, MD  insulin degludec (TRESIBA FLEXTOUCH) 100 UNIT/ML FlexTouch Pen Inject 24 Units into the skin daily. 05/26/20   Shamleffer, Melanie Crazier, MD  isosorbide dinitrate (ISORDIL) 30 MG tablet Take 30 mg by mouth every morning.    Millsaps, Kimberly, NP  latanoprost (XALATAN) 0.005 % ophthalmic solution Place 1 drop into both eyes at bedtime.    [provider]  lisinopril (ZESTRIL) 2.5 MG tablet TAKE 1 TABLET EVERY DAY 03/28/20   Jerline Pain, MD  methocarbamol (ROBAXIN) 500 MG tablet Take 1 tablet (500 mg total) by mouth 2 (two) times daily as needed for muscle spasms. 02/22/19   Geradine Girt, DO  nitroGLYCERIN (NITROSTAT) 0.4 MG SL tablet Place 0.4 mg under the tongue every 5 (five) minutes as needed for chest pain.    [provider]  NOVOLOG FLEXPEN 100 UNIT/ML FlexPen Max daily 30 units per scale 08/31/20   Shamleffer, Melanie Crazier, MD  potassium chloride SA (KLOR-CON) 20 MEQ tablet Take 20 mEq by mouth daily. 02/23/20   [provider]  rosuvastatin  (CRESTOR) 20 MG tablet Take 1 tablet (20 mg total) by mouth daily at 6 PM. 10/07/15   Minus Liberty, MD  Semaglutide, 2 MG/DOSE, (OZEMPIC, 2 MG/DOSE,) 8 MG/3ML SOPN Inject 2 mg into the skin once a week. 08/31/20   Shamleffer, Melanie Crazier, MD  sulfamethoxazole-trimethoprim (BACTRIM DS) 800-160 MG tablet Bactrim DS 800 mg-160 mg tablet  Take 1 tablet every 12 hours by oral route as directed for 5 days.    [provider]  traMADol (ULTRAM) 50 MG tablet Take 50 mg by mouth 2 (two) times daily. 11/28/15   [provider]  TRUEplus Lancets 33G MISC Apply 1 each topically 3 (three) times daily. 01/28/20   [provider]  Vitamin D, Ergocalciferol, (DRISDOL) 1.25 MG (50000 UT) CAPS capsule Take 1 capsule (50,000 Units total) by mouth every 7 (seven) days. 12/30/18   Georgia Lopes, DO    Allergies    Lipitor  [atorvastatin calcium], Metformin and related, Atorvastatin, and Levofloxacin  Review of Systems   Review of Systems  All other systems reviewed and are negative. Ten systems reviewed and are negative  for acute change, except as noted in the HPI.   Physical Exam Updated Vital Signs BP (!) 154/90 (BP Location: Right Arm)   Pulse 94   Temp 99.5 F (37.5 C)   Resp 20   Ht _0  (1.575 m)   Wt 99.8 kg   SpO2 100%   BMI 40.24 kg/m   Physical Exam Vitals and nursing note reviewed.  Constitutional:      General: She is not in acute distress.    Appearance: Normal appearance. She is not ill-appearing, toxic-appearing or diaphoretic.  HENT:     Head: Normocephalic and atraumatic.     Right Ear: External ear normal.     Left Ear: External ear normal.     Nose: Nose normal.     Mouth/Throat:     Mouth: Mucous membranes are moist.     Pharynx: Oropharynx is clear. No oropharyngeal exudate or posterior oropharyngeal erythema.  Eyes:     General: No scleral icterus.       Right eye: No discharge.        Left eye: No discharge.     Extraocular  Movements: Extraocular movements intact.     Conjunctiva/sclera: Conjunctivae normal.  Cardiovascular:     Rate and Rhythm: Normal rate and regular rhythm.     Pulses: Normal pulses.     Heart sounds: Normal heart sounds. No murmur heard.   No friction rub. No gallop.  Pulmonary:     Effort: Pulmonary effort is normal. No respiratory distress.     Breath sounds: Normal breath sounds. No stridor. No wheezing, rhonchi or rales.  Abdominal:     General: Abdomen is flat.     Palpations: Abdomen is soft.     Tenderness: There is abdominal tenderness.     Comments: Protuberant abdomen that is soft.  Mild tenderness noted along the periumbilical region.  Musculoskeletal:        General: Normal range of motion.     Cervical back: Normal range of motion and neck supple. No tenderness.  Skin:    General: Skin is warm and dry.  Neurological:     General: No focal deficit present.     Mental Status: She is alert and oriented to person, place, and time.  Psychiatric:        Mood and Affect: Mood normal.        Behavior: Behavior normal.   ED Results / Procedures / Treatments   Labs (all labs ordered are listed, but only abnormal results are displayed) Labs Reviewed  RESP PANEL BY RT-PCR (FLU A&B, COVID) ARPGX2 - Abnormal; Notable for the following components:      Result Value   Influenza A by PCR POSITIVE (*)    All other components within normal limits  CBC WITH DIFFERENTIAL/PLATELET - Abnormal; Notable for the following components:   WBC 2.0 (*)    RBC 2.47 (*)    Hemoglobin 10.6 (*)    HCT 30.1 (*)    MCV 121.9 (*)    MCH 42.9 (*)    Platelets 108 (*)    nRBC 1.5 (*)    Neutro Abs 1.3 (*)    Lymphs Abs 0.5 (*)    All other components within normal limits  COMPREHENSIVE METABOLIC PANEL - Abnormal; Notable for the following components:   Glucose, Bld 190 (*)    Creatinine, Ser 1.29 (*)    AST 55 (*)    Total Bilirubin 1.4 (*)    GFR, Estimated  45 (*)    All other components  within normal limits  URINALYSIS, ROUTINE W REFLEX MICROSCOPIC - Abnormal; Notable for the following components:   APPearance HAZY (*)    Specific Gravity, Urine 1.002 (*)    Glucose, UA >=500 (*)    Hgb urine dipstick MODERATE (*)    Ketones, ur 5 (*)    Leukocytes,Ua LARGE (*)    Bacteria, UA MANY (*)    All other components within normal limits  VITAMIN B12 - Abnormal; Notable for the following components:   Vitamin B-12 110 (*)    All other components within normal limits  FERRITIN - Abnormal; Notable for the following components:   Ferritin 441 (*)    All other components within normal limits  RETICULOCYTES - Abnormal; Notable for the following components:   RBC. 2.45 (*)    Immature Retic Fract 28.8 (*)    All other components within normal limits  URINE CULTURE  LIPASE, BLOOD  FOLATE  IRON AND TIBC  TROPONIN I (HIGH SENSITIVITY)  TROPONIN I (HIGH SENSITIVITY)   EKG EKG Interpretation  Date/Time:  Wednesday November 30 2020 13:49:30 EST Ventricular Rate:  82 PR Interval:  188 QRS Duration: 150 QT Interval:  458 QTC Calculation: 535 R Axis:   25 Text Interpretation: Normal sinus rhythm Non-specific intra-ventricular conduction block Abnormal ECG Confirmed by Lennice Sites ([id number]) on 11/30/2020 6:09:56 PM  Radiology CT ABDOMEN PELVIS WO CONTRAST  Result Date: 11/30/2020 CLINICAL DATA:  Abdominal pain with lightheadedness for 2 days in a 70 year old female. EXAM: CT ABDOMEN AND PELVIS WITHOUT CONTRAST TECHNIQUE: Multidetector CT imaging of the abdomen and pelvis was performed following the standard protocol without IV contrast. COMPARISON:  January 12, 2019. FINDINGS: Lower chest: Basilar scarring and atelectasis without effusion or sign of consolidative changes. Heart size is enlarged with signs of coronary revascularization cardiac pacer defibrillator leads in place, incompletely evaluated. Hepatobiliary: Cholelithiasis without pericholecystic stranding. Smooth hepatic  contours. No visible lesion. No gross biliary duct distension. Pancreas: Normal appearance of the pancreas with respect to the head in the body. In the tail of the pancreas there is a small amount of gas that extends into the splenic hilum. The colon is adjacent to the splenic hilum and could account for gas on some images but on image 25 of series 3 gas extends into the splenic hilum and there is some stranding about and atrophy of the pancreas. Previous imaging showed signs of marked interstitial edematous pancreatitis. Mild stranding about the tail on today's study is much less than on previous imaging. Spleen: Gas at the level of the splenic hilum adjacent to the splenic flexure of the colon but outside of the expected colonic lumen. Adrenals/Urinary Tract: Adrenal glands are normal. Mild perinephric stranding. No hydronephrosis. Substantial stranding about the collapsed urinary bladder. 7 mm calculus in the lower pole the RIGHT kidney. Otherwise negative for urinary tract calculi. Vascular calcifications of the bilateral renal arteries extending to renal parenchyma. Stomach/Bowel: Small hiatal hernia. Normal appendix. Gas extending in the splenic hilum potentially from the splenic flexure of the colon Vascular/Lymphatic: Signs of previous vascular intervention involving both the arterial and venous structures of the pelvis, LEFT common iliac vein and bilateral iliac arteries as before. Generalized atherosclerotic changes. No abdominal adenopathy or pelvic adenopathy. Reproductive: Cystic RIGHT adnexal lesion 5.1 x 4.3 cm with a mean density of 6 Hounsfield units previously 4.9 x 3.7 cm. Noncontrast appearance of reproductive structures is otherwise unremarkable. Other: No ascites. Ventral hernia near  the umbilicus contains fat similar to previous studies. Musculoskeletal: Age indeterminate compression fractures have occurred at the L3 level since prior imaging, potentially chronic but new since January of 2021.  Spinal degenerative changes. IMPRESSION: Findings that are suspicious for fistulization and or sinus tract formation from the splenic flexure of the colon into the splenic hilum, potentially related to prior pancreatitis, also with changes that suggest ongoing/acute pancreatitis. Substantial stranding about the collapsed urinary bladder, correlate with urinalysis to exclude cystitis. 5.1 cm LEFT adnexal simple appearing cyst, not adequately characterized. Recommend prompt follow-up with pelvic sonogram for further evaluation, this has enlarged slightly since the most recent comparison study. Cholelithiasis without evidence of acute cholecystitis. Signs of previous vascular intervention involving both the arterial and venous structures of the pelvis, LEFT common iliac vein and bilateral iliac arteries as before. 7 mm calculus in the lower pole of the RIGHT kidney. Small hiatal hernia. Ventral hernia near the umbilicus contains fat similar to previous studies. Aortic Atherosclerosis (ICD10-I70.0). Electronically Signed   By: Zetta Bills M.D.   On: 11/30/2020 17:46    Procedures Procedures   Medications Ordered in ED Medications  lactated ringers bolus 500 mL (500 mLs Intravenous Not Given 11/30/20 2223)  cephALEXin (KEFLEX) capsule 500 mg (has no administration in time range)  HYDROcodone-acetaminophen (NORCO/VICODIN) 5-325 MG per tablet 1 tablet (1 tablet Oral Given 11/30/20 2222)   ED Course  I have reviewed the triage vital signs and the nursing notes.  Pertinent labs & imaging results that were available during my care of the [name] were reviewed by me and considered in my medical decision making (see chart for details).  Clinical Course as of 11/30/20 2255  Wed Nov 30, 2020  1903 Influenza A By PCR(!): POSITIVE [LJ]  1922 Vitamin B12(!): 110 [LJ]  2203 [name] reassessed.  Currently drinking water in bed.  Denies any nausea or vomiting.  Still complaining of mild abdominal pain so we will  give a dose of Vicodin.  UA pending.  Will reassess and likely discharge. [LJ]  2216 Leukocytes,Ua(!): LARGE [LJ]  2217 Bacteria, UA(!): MANY [LJ]  2217 WBC, UA: 11-20 [LJ]    Clinical Course User Index [LJ] Rayna Sexton, PA-C   MDM Rules/Calculators/A&P                          Pt is a 70 y.o. female who presents to the emergency department with symptoms associated with influenza A.  Labs: CBC with a white count of 2, RBCs of 2.47, hemoglobin of 10.6, hematocrit of 30.1, MCV of 121.9, MCH of one 2.9, platelets 108, neutrophils 1.3, lymphocytes of 0.5. CMP with a glucose of 190, creatinine 1.29, AST 55, total bilirubin 1.4, GFR 45. UA with large leukocytes, many bacteria, 5 ketones, moderate hemoglobin, greater than 500 glucose. Respiratory panel is positive for influenza A. Ferritin elevated at 441. Vitamin B12 low at 110.  Imaging: CT scan of the abdomen/pelvis without contrast showing  IMPRESSION: Findings that are suspicious for fistulization and or sinus tract formation from the splenic flexure of the colon into the splenic hilum, potentially related to prior pancreatitis, also with changes that suggest ongoing/acute pancreatitis. Substantial stranding about the collapsed urinary bladder, correlate with urinalysis to exclude cystitis. 5.1 cm LEFT adnexal simple appearing cyst, not adequately characterized. Recommend prompt follow-up with pelvic sonogram for further evaluation, this has enlarged slightly since the most recent comparison study. Cholelithiasis without evidence of acute cholecystitis. Signs of previous vascular  intervention involving both the arterial and venous structures of the pelvis, LEFT common iliac vein and bilateral iliac arteries as before. 7 mm calculus in the lower pole of the RIGHT kidney. Small hiatal hernia. Ventral hernia near the umbilicus contains fat similar to previous studies. Aortic Atherosclerosis  I, Rayna Sexton, PA-C, personally reviewed and  evaluated these images and lab results as part of my medical decision-making.  [name] found to be positive for influenza A today.  Feel that this is likely the cause of her symptoms today.  [name] currently tolerating p.o. intake without difficulty.  Vital signs appear stable.  Incidentally found to have a significantly elevated MCV of 121.9.  Anemia panel was obtained showing a low B12 at 110.  [name] notes a history of B12 deficiency in the past.  Recommended PCP follow-up and likely B12 supplementation.  [name] also notes a history of recurrent UTIs.  UA appears infectious.  Will discharge on a course of Keflex.  First dose given in the emergency department.  Urine culture obtained.  CT scan obtained of the abdomen/pelvis with findings as noted above.  Recommended [name] follow-up with her PCP regarding her left ovarian cyst.  There was concern of possible ongoing pancreatitis.  Lipase is within normal limits at 36.  Feel the [name] is stable for discharge at this time and she is agreeable.  We discussed return precautions.  Her questions were answered and she was amicable at the time of discharge.  Note: Portions of this report may have been transcribed using voice recognition software. Every effort was made to ensure accuracy; however, inadvertent computerized transcription errors may be present.   Final Clinical Impression(s) / ED Diagnoses Final diagnoses:  B12 deficiency  Influenza A  Acute cystitis with hematuria  Left ovarian cyst   Rx / DC Orders ED Discharge Orders          Ordered    cephALEXin (KEFLEX) 500 MG capsule  4 times daily        11/30/20 2224             Rayna Sexton, PA-C 11/30/20 2254    Rayna Sexton, PA-C 11/30/20 2255    Lennice Sites, DO 11/30/20 2341

## 2020-12-04 LAB — URINE CULTURE: Culture: >=100000 — AB

## 2020-12-09 NOTE — Progress Notes (Signed)
No ICM remote transmission received for 12/05/2020 and next ICM transmission scheduled for 12/26/2020.

## 2020-12-23 ENCOUNTER — Encounter: Payer: Self-pay | Admitting: Internal Medicine

## 2020-12-23 ENCOUNTER — Other Ambulatory Visit: Payer: Self-pay

## 2020-12-23 ENCOUNTER — Ambulatory Visit (INDEPENDENT_AMBULATORY_CARE_PROVIDER_SITE_OTHER): Payer: Medicare HMO | Admitting: Internal Medicine

## 2020-12-23 VITALS — BP 132/82 | HR 77 | Ht 62.0 in | Wt 222.0 lb

## 2020-12-23 DIAGNOSIS — Z794 Long term (current) use of insulin: Secondary | ICD-10-CM | POA: Diagnosis not present

## 2020-12-23 DIAGNOSIS — E1165 Type 2 diabetes mellitus with hyperglycemia: Secondary | ICD-10-CM

## 2020-12-23 DIAGNOSIS — E1142 Type 2 diabetes mellitus with diabetic polyneuropathy: Secondary | ICD-10-CM

## 2020-12-23 DIAGNOSIS — G63 Polyneuropathy in diseases classified elsewhere: Secondary | ICD-10-CM | POA: Diagnosis not present

## 2020-12-23 DIAGNOSIS — E1159 Type 2 diabetes mellitus with other circulatory complications: Secondary | ICD-10-CM | POA: Diagnosis not present

## 2020-12-23 DIAGNOSIS — N9489 Other specified conditions associated with female genital organs and menstrual cycle: Secondary | ICD-10-CM | POA: Diagnosis not present

## 2020-12-23 LAB — POCT GLYCOSYLATED HEMOGLOBIN (HGB A1C): Hemoglobin A1C: 6.3 % — AB (ref 4.0–5.6)

## 2020-12-23 MED ORDER — GABAPENTIN 300 MG PO CAPS: 300.0000 mg | ORAL_CAPSULE | Freq: Every day | ORAL | 3 refills | Status: DC

## 2020-12-23 NOTE — Progress Notes (Signed)
Name: [name]  Age/ Sex: 70 y.o., female   MRN/ DOB: [id number], 12-20-1950     PCP: Everardo Beals, NP   Reason for Endocrinology Evaluation: Type 2 Diabetes Mellitus  Initial Endocrine Consultative Visit: 04/20/2019    PATIENT IDENTIFIER: [name] is a 70 y.o. female with a past medical history of DM, HTN, Dyslipidemia , CAD and hx of pancreatitis.. The patient has followed with Endocrinology clinic since 04/20/2019 for consultative assistance with management of her diabetes.     DIABETIC HISTORY:  [name] was diagnosed with T2DM many years ago, she is intolerant to Metformin, has been on Glipizide without reported intolerance. Her hemoglobin A1c has ranged from 7.8% in 2017, peaking at 9.5% in 2014.  On her initial visit to our clinic she had an A1c of 7.7%  , she was on MDI regimen and Ozempic. Marland Kitchen We switched levemir to tresiba and continued Ozempic and Novolog.    farxiga started 11/2019 Ozempic stopped 12/2020 due to pancreatitis on CT imaging.     SUBJECTIVE:   During the last visit (08/29/2020): A1c 7.0 %, We adjusted MDI regimen  and continued Ozempic and [place]    Today (12/23/2020): Ms. Heenan is here for a follow up on diabetes.  She checks her blood sugar through the CGM The patient has had  hypoglycemic episodes since the last clinic visit, which typically occur 1 x /month  - most often occuring during the day. The patient is  symptomatic with these episodes.   Pt presented to the ED with dizziness 11/30/2020. 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 in to the splenic hilum potentially related to prior pancreatitis also with changes that suggests prior/on going  pancreatitis  as well as urinary bladder stranding and left adnexal cysts 5.1 cm, cholelithiasis present   She tested positive for Influenza on that day as well as UTI    Lipase on 11/30/2020 was normal at 36   She denies nausea,  vomiting or diarrhea  Abdominal pain resolved  Neuropathic symptoms improved     HOME DIABETES REGIMEN:  Tresiba  24 units ONCE  Daily  Ozempic 1 mg once weekly  Farxiga 10 mg daily  CF: Novolog (BG-130/40)     Statin: Yes ACE-I/ARB: No      CONTINUOUS GLUCOSE MONITORING RECORD INTERPRETATION    Dates of Recording: 12/3-12/16/2022  Sensor description:dexcom  Results statistics:   CGM use % of time 93  Average and SD 164/46  Time in range   64   %  % Time Above 180 30  % Time above 250 4  % Time Below target <1      Glycemic patterns summary: Hyperglycemia noted post breakfast,BG within range the rest of the day   Hyperglycemic episodes  post prandial   Hypoglycemic episodes occurred post bolus   Overnight periods: optimal        DIABETIC COMPLICATIONS: Microvascular complications:  Neuropathy Denies: CKD, retinopathy  Last Eye Exam: Completed 2019  Macrovascular complications:  CAD, CHF Denies: CVA, PVD   HISTORY:  Past Medical History:  Past Medical History:  Diagnosis Date   Abnormal LFTs    AICD (automatic cardioverter/defibrillator) present    Angina decubitus ([place]) 11/06/2012   Angina pectoris ([place])    Anxiety    Arthritis    Back pain    Bundle branch block 05/2016   CAD (coronary artery disease) 06/18/2010   CHF (congestive heart failure) ([place])  Chronic combined systolic and diastolic CHF, NYHA class 3 (HCC)    Chronic combined systolic and diastolic heart failure (HCC)    Chronic systolic dysfunction of left ventricle 09/24/2012   Coronary artery disease    s/p CABG 2007   Depression    Dermal mycosis    Diabetes mellitus    type II   DKA (diabetic ketoacidoses) 01/12/2019   DVT (deep venous thrombosis) ([place]) 09/2015   bilateral   Edema, lower extremity    Glaucoma    Hyperlipidemia    Hypertension    Hypoglycemia 09/09/2012   Insomnia    Insulin dependent diabetes mellitus ([place]) 04/23/2012   Ischemic cardiomyopathy     EF 30-35%, s/p ICD 4/08 by Dr Leonia Reeves   Joint pain    LBBB (left bundle branch block) 09/24/2012   Morbid obesity ([place]) 11/06/2012   Obesity    Oxygen dependent 2 L   Pain in left knee    Peripheral neuropathy    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   LIMA to D2, SVG to ramus intermediate, sequential SVG to OM1-OM2, SVG to RCA with EVH via both legs    Sleep apnea    uses O2 at night   Tinea    Type 2 diabetes mellitus with hyperglycemia, with long-term current use of insulin ([place]) 04/20/2019   Uncontrolled diabetes mellitus 02/2019   Ventricular tachycardia 01/16/15   sustained VT terminated with ATP, CL 340 msec   Vitamin B 12 deficiency 11/05/2018   Vitamin D deficiency 11/05/2018   Past Surgical History:  Past Surgical History:  Procedure Laterality Date   BI-VENTRICULAR IMPLANTABLE CARDIOVERTER DEFIBRILLATOR UPGRADE N/A 09/25/2012   Procedure: BI-VENTRICULAR IMPLANTABLE CARDIOVERTER DEFIBRILLATOR UPGRADE;  Surgeon: Coralyn Mark, MD;  Location: [facility] CATH LAB;  Service: Cardiovascular;  Laterality: N/A;   BREAST BIOPSY Right 04/05/2014   CARDIAC CATHETERIZATION     CARDIAC DEFIBRILLATOR PLACEMENT  4/08   by Dr Leonia Reeves (MDT)   COLONOSCOPY WITH PROPOFOL N/A 10/12/2016   Procedure: COLONOSCOPY WITH PROPOFOL;  Surgeon: Doran Stabler, MD;  Location: [facility] ENDOSCOPY;  Service: Gastroenterology;  Laterality: N/A;   CORONARY ARTERY BYPASS GRAFT  10/05/2005   by Dr Cyndia Bent   IMPLANTABLE CARDIOVERTER DEFIBRILLATOR IMPLANT  09/25/12   attempt of upgrade to CRT-D unsuccessful due to CS anatomy, SJM Unify [place]aura device placed with LV port capped by Dr Rayann Heman   IR GENERIC HISTORICAL  10/03/2015   IR US GUIDE VASC ACCESS LEFT 10/03/2015 Jacqulynn Cadet, MD [facility]   IR GENERIC HISTORICAL  10/03/2015   IR VENO/EXT/UNI LEFT 10/03/2015 Jacqulynn Cadet, MD [facility]   IR GENERIC HISTORICAL  10/03/2015   IR VENOCAVAGRAM IVC 10/03/2015 Jacqulynn Cadet, MD [facility]   IR GENERIC HISTORICAL  10/03/2015    IR INFUSION THROMBOL VENOUS INITIAL (MS) 10/03/2015 Jacqulynn Cadet, MD [facility]   IR GENERIC HISTORICAL  10/03/2015   IR US GUIDE VASC ACCESS LEFT 10/03/2015 Jacqulynn Cadet, MD [facility]   IR GENERIC HISTORICAL  10/04/2015   IR THROMBECT VENO MECH MOD SED 10/04/2015 Sandi Mariscal, MD [facility]   IR GENERIC HISTORICAL  10/04/2015   IR TRANSCATH PLC STENT 1ST ART NOT LE CV CAR VERT CAR 10/04/2015 Sandi Mariscal, MD [facility]   IR GENERIC HISTORICAL  10/04/2015   IR THROMB F/U EVAL ART/VEN FINAL DAY (MS) 10/04/2015 Sandi Mariscal, MD [facility]   IR GENERIC HISTORICAL  11/02/2015   IR RADIOLOGIST EVAL & MGMT 11/02/2015 Sandi Mariscal, MD [facility]   IR RADIOLOGIST EVAL & MGMT  04/26/2016   LEFT HEART CATH AND CORS/GRAFTS ANGIOGRAPHY N/A 01/19/2019   Procedure: LEFT HEART CATH AND CORS/GRAFTS ANGIOGRAPHY;  Surgeon: Belva Crome, MD;  Location: [facility] CV LAB;  Service: Cardiovascular;  Laterality: N/A;   Social History:  reports that she has never smoked. She has never used smokeless tobacco. She reports that she does not drink alcohol and does not use drugs. Family History:  Family History  Problem Relation Age of Onset   Diabetes Mother 16       died - HTN   Stroke Mother    High blood pressure Mother    Sudden death Mother    Obesity Mother    Other Other        No early family hx of CAD     HOME MEDICATIONS: Allergies as of 12/23/2020       Reactions   Lipitor  [atorvastatin Calcium] Rash   Metformin And Related Itching, Swelling, Other (See Comments)   Leg pain & swelling in legs   Atorvastatin Itching, Rash   Levofloxacin Itching        Medication List        Accurate as of December 23, 2020  9:53 AM. If you have any questions, ask your nurse or doctor.          STOP taking these medications    GNP True Metrix Glucose Meter w/Device Kit Stopped by: Dorita Sciara, MD   Ozempic (2 MG/DOSE) 8 MG/3ML Sopn Generic drug: Semaglutide (2  MG/DOSE) Stopped by: Dorita Sciara, MD   TRUEplus Lancets 33G Misc Stopped by: Dorita Sciara, MD       TAKE these medications    albuterol (2.5 MG/3ML) 0.083% nebulizer solution Commonly known as: PROVENTIL Take 2.5 mg by nebulization every 6 (six) hours as needed for wheezing or shortness of breath. What changed: Another medication with the same name was removed. Continue taking this medication, and follow the directions you see here. Changed by: Dorita Sciara, MD   Alcohol Wipes 70 % Pads DropSafe Alcohol Prep Pads  USE WHEN CHECKING BLOOD SUGARS   alendronate 10 MG tablet Commonly known as: FOSAMAX alendronate 10 mg tablet   amitriptyline 10 MG tablet Commonly known as: ELAVIL Take 10 mg by mouth daily.   AMLODIPINE BENZOATE PO amlodipine   apixaban 5 MG Tabs tablet Commonly known as: ELIQUIS Take 1 tablet (5 mg total) by mouth 2 (two) times daily.   Baclofen 5 MG Tabs baclofen 5 mg tablet  TAKE 1 TABLET TWICE DAILY AS NEEDED  FOR  MUSCLE  SPASM   Calcium Carb-Cholecalciferol 600-400 MG-UNIT Tabs calcium carbonate 600 mg-vitamin D3 10 mcg (400 unit) tablet  Take 1 tablet every day by oral route for 90 days.   carvedilol 6.25 MG tablet Commonly known as: COREG Take 1 tablet (6.25 mg total) by mouth 2 (two) times daily with a meal.   cyclobenzaprine 5 MG tablet Commonly known as: FLEXERIL Take 5 mg by mouth 3 (three) times daily as needed for muscle spasms.   dapagliflozin propanediol 10 MG Tabs tablet Commonly known as: Farxiga Take 1 tablet (10 mg total) by mouth daily.   Dexcom G6 Sensor Misc 1 Device by Does not apply route as directed.   Dexcom G6 Transmitter Misc 1 Device by Does not apply route as directed.   Droplet Pen Needles 32G X 4 MM Misc Generic drug: Insulin Pen Needle USE  IN THE MORNING, AT NOON, IN THE EVENING, AND AT BEDTIME.   ferrous sulfate 325 (65 FE) MG EC tablet Take 325 mg by mouth daily.   fluconazole  150 MG tablet Commonly known as: DIFLUCAN Take 150 mg by mouth every 7 (seven) days.   furosemide 40 MG tablet Commonly known as: LASIX TAKE 1 TABLET EVERY DAY   gabapentin 100 MG capsule Commonly known as: NEURONTIN Take 2 capsules (200 mg total) by mouth at bedtime.   isosorbide dinitrate 30 MG tablet Commonly known as: ISORDIL Take 30 mg by mouth every morning.   latanoprost 0.005 % ophthalmic solution Commonly known as: XALATAN Place 1 drop into both eyes at bedtime.   lisinopril 2.5 MG tablet Commonly known as: ZESTRIL TAKE 1 TABLET EVERY DAY   meloxicam 15 MG tablet Commonly known as: MOBIC Take 15 mg by mouth daily.   methocarbamol 500 MG tablet Commonly known as: ROBAXIN Take 1 tablet (500 mg total) by mouth 2 (two) times daily as needed for muscle spasms.   MUSCLE RELIEF EX Apply 1 application topically daily. For pain   nitroGLYCERIN 0.4 MG SL tablet Commonly known as: NITROSTAT Place 0.4 mg under the tongue every 5 (five) minutes as needed for chest pain.   NovoLOG FlexPen 100 UNIT/ML FlexPen Generic drug: insulin aspart Max daily 30 units per scale   potassium chloride SA 20 MEQ tablet Commonly known as: KLOR-CON M Take 20 mEq by mouth daily.   rosuvastatin 20 MG tablet Commonly known as: CRESTOR Take 1 tablet (20 mg total) by mouth daily at 6 PM.   sulfamethoxazole-trimethoprim 800-160 MG tablet Commonly known as: BACTRIM DS Bactrim DS 800 mg-160 mg tablet  Take 1 tablet every 12 hours by oral route as directed for 5 days.   traMADol 50 MG tablet Commonly known as: ULTRAM Take 50 mg by mouth 2 (two) times daily.   Tyler Aas FlexTouch 100 UNIT/ML FlexTouch Pen Generic drug: insulin degludec Inject 24 Units into the skin daily.   Vitamin D (Ergocalciferol) 1.25 MG (50000 UNIT) Caps capsule Commonly known as: DRISDOL Take 1 capsule (50,000 Units total) by mouth every 7 (seven) days.         OBJECTIVE:   Vital Signs: BP 132/82 (BP  Location: Left Arm, Patient Position: Sitting)    Pulse 77    Ht _0  (1.575 m)    Wt 222 lb (100.7 kg)    SpO2 99%    BMI 40.60 kg/m    Wt Readings from Last 3 Encounters:  12/23/20 222 lb (100.7 kg)  11/30/20 220 lb (99.8 kg)  08/31/20 219 lb 9.6 oz (99.6 kg)     Exam: General: Pt appears well and is in NAD  Lungs: Clear with good BS bilat with no rales, rhonchi, or wheezes  Heart: RRR with normal S1 and S2 and no gallops; no murmurs; no rub  Abdomen : Soft and non tender   Extremities:  Trace pretibial edema.  Neuro: MS is good with appropriate affect, pt is alert and Ox3     DM Foot Exam 02/25/2020    The skin of the feet is  without sores or ulcerations, but has left 3rd toe scab as well as scattered ankle scabs The pedal pulses are undetectable  The sensation is decreased to a screening 5.07, 10 gram monofilament bilaterally ( decreased around the toes on the right and decreased mid arch on the left )      DATA REVIEWED:  Lab Results  Component Value Date   HGBA1C 6.8 (A) 09/02/2020   HGBA1C 7.0 (A) 05/26/2020   HGBA1C 6.1 (A) 02/25/2020   Lab Results  Component Value Date   LDLCALC 64 11/03/2018   CREATININE 1.29 (H) 11/30/2020    Lab Results  Component Value Date   CHOL 111 11/03/2018   HDL 30 (L) 11/03/2018   LDLCALC 64 11/03/2018   TRIG 87 11/03/2018   CHOLHDL 3.6 08/05/2017         ASSESSMENT / PLAN / RECOMMENDATIONS:   1) Type 2 Diabetes Mellitus, Optimally controlled, With Neuropathic and  macrovascular complications - Most recent A1c of 6.3  %. Goal A1c < 7.0 %.    -Her A1c remains at goal -Inlight of CT imaging with evidence of cholelithiasis and pancreatitis, will STOP Ozempic, other then that, no changes will be made today     MEDICATIONS: - STOP Ozempic  -Continue Tresiba24 units ONCE  Daily  -Continue Farxiga 10 mg, 1 tablet with Breakfast  -Correction factor: Novolog ( BG-130/40)  EDUCATION / INSTRUCTIONS: BG monitoring  instructions: Patient is instructed to check her blood sugars 3 times a day, before meals Call [facility] if: BG persistently < 70 I reviewed the Rule of 15 for the treatment of hypoglycemia in detail with the patient. Literature supplied.   2) Diabetic complications:  Eye: Does not have known diabetic retinopathy.  Neuro/ Feet: Does  have known diabetic peripheral neuropathy based on foot exam from 07/16/2019 Renal: Patient does not have known baseline CKD. She is not on an ACEI/ARB at present.  3) Peripheral Neuropathy :  -Her symptoms have been improving    Medication  - Will switch Gabapentin 200 mg to 300 mg 1 tablet QHS     4) Right adnexal Mass :  - This has been note don CT scan 11/30/2020 with enlargement. Pt is ont aware of this. A referral has been placed with Gynecology     F/U in 4 months   Signed electronically by: Mack Guise, MD  [facility]  [facility] [address] [place], [place] [address] Phone: [phone] FAX: [phone]   CC: Everardo Beals, [place] [address] Phone: [phone]  Fax: [phone]  Return to Endocrinology clinic as below: Future Appointments  Date Time Provider [place]  12/26/2020  7:10 AM [facility] DEVICE REMOTES [facility]  01/13/2021 10:30 AM Marzetta Board, DPM [facility]  01/23/2021  9:40 AM [facility] DEVICE REMOTES [facility]  02/10/2021  1:20 PM Jerline Pain, MD [facility]  04/24/2021  9:40 AM [facility] DEVICE REMOTES [facility]  07/24/2021  9:40 AM [facility] DEVICE REMOTES [facility]  10/23/2021  9:40 AM [facility] DEVICE REMOTES [facility]  01/22/2022  9:40 AM [facility] DEVICE REMOTES [facility]

## 2020-12-23 NOTE — Patient Instructions (Addendum)
-   STOP Ozempic  - Continue Tresiba 24 units ONCE  Daily - Continue Farxiga 10 mg, 1 tablet with Breakfast  -Novolog correctional insulin: Use the scale below to help guide you:   Blood sugar before meal Number of units to inject  Less than 170 0 unit  171 -  210 2 units  211-  250 3 units  251 -  290 4 units  291 -  330 5 units  331 -  370 6 units   Start Gabapentin 100 mg at Bedtime ( for neuropathy) , increase to 2 tablets at bedtime by the seconds week if symptoms persist  HOW TO TREAT LOW BLOOD SUGARS (Blood sugar LESS THAN 70 MG/DL) Please follow the RULE OF 15 for the treatment of hypoglycemia treatment (when your (blood sugars are less than 70 mg/dL)   STEP 1: Take 15 grams of carbohydrates when your blood sugar is low, which includes:  3-4 GLUCOSE TABS  OR 3-4 OZ OF JUICE OR REGULAR SODA OR ONE TUBE OF GLUCOSE GEL    STEP 2: RECHECK blood sugar in 15 MINUTES STEP 3: If your blood sugar is still low at the 15 minute recheck --> then, go back to STEP 1 and treat AGAIN with another 15 grams of carbohydrates.

## 2020-12-28 ENCOUNTER — Other Ambulatory Visit: Payer: Self-pay

## 2020-12-28 NOTE — Patient Outreach (Signed)
Aging Gracefully Program  12/28/2020  [name] November 17, 1950 [id number]  [facility] Evaluation Interviewer made contact with patient regarding Aging Gracefully 5 month survey completion. Patient shared she is still not living in the home as their are still electrical and plumbing issues as well as the repairs have not took place as of yet.  Interviewer will send referral email to [facility] for follow up.  [place] Management Assistant  [phone]

## 2020-12-29 NOTE — Progress Notes (Signed)
No ICM remote transmission received for 12/26/2020 and next ICM transmission scheduled for 02/07/2021.

## 2021-01-05 ENCOUNTER — Other Ambulatory Visit: Payer: Self-pay

## 2021-01-05 ENCOUNTER — Emergency Department (HOSPITAL_COMMUNITY)
Admission: EM | Admit: 2021-01-05 | Discharge: 2021-01-05 | Disposition: A | Discharge status: Home / Self Care | Payer: Medicare HMO | Attending: Emergency Medicine | Admitting: Emergency Medicine

## 2021-01-05 ENCOUNTER — Ambulatory Visit (INDEPENDENT_AMBULATORY_CARE_PROVIDER_SITE_OTHER): Payer: Medicare HMO | Admitting: Obstetrics & Gynecology

## 2021-01-05 ENCOUNTER — Emergency Department (HOSPITAL_COMMUNITY): Payer: Medicare HMO

## 2021-01-05 ENCOUNTER — Encounter: Payer: Self-pay | Admitting: Obstetrics & Gynecology

## 2021-01-05 VITALS — BP 150/90 | Ht 62.0 in | Wt 222.0 lb

## 2021-01-05 DIAGNOSIS — W19XXXA Unspecified fall, initial encounter: Secondary | ICD-10-CM | POA: Diagnosis not present

## 2021-01-05 DIAGNOSIS — Z7901 Long term (current) use of anticoagulants: Secondary | ICD-10-CM | POA: Insufficient documentation

## 2021-01-05 DIAGNOSIS — Z951 Presence of aortocoronary bypass graft: Secondary | ICD-10-CM | POA: Diagnosis not present

## 2021-01-05 DIAGNOSIS — Z79899 Other long term (current) drug therapy: Secondary | ICD-10-CM | POA: Diagnosis not present

## 2021-01-05 DIAGNOSIS — E162 Hypoglycemia, unspecified: Secondary | ICD-10-CM | POA: Diagnosis not present

## 2021-01-05 DIAGNOSIS — I110 Hypertensive heart disease with heart failure: Secondary | ICD-10-CM | POA: Diagnosis not present

## 2021-01-05 DIAGNOSIS — I5042 Chronic combined systolic (congestive) and diastolic (congestive) heart failure: Secondary | ICD-10-CM | POA: Diagnosis not present

## 2021-01-05 DIAGNOSIS — R220 Localized swelling, mass and lump, head: Secondary | ICD-10-CM | POA: Diagnosis not present

## 2021-01-05 DIAGNOSIS — Z23 Encounter for immunization: Secondary | ICD-10-CM | POA: Insufficient documentation

## 2021-01-05 DIAGNOSIS — I2510 Atherosclerotic heart disease of native coronary artery without angina pectoris: Secondary | ICD-10-CM | POA: Diagnosis not present

## 2021-01-05 DIAGNOSIS — Z794 Long term (current) use of insulin: Secondary | ICD-10-CM | POA: Insufficient documentation

## 2021-01-05 DIAGNOSIS — N9489 Other specified conditions associated with female genital organs and menstrual cycle: Secondary | ICD-10-CM | POA: Diagnosis not present

## 2021-01-05 DIAGNOSIS — S0993XA Unspecified injury of face, initial encounter: Secondary | ICD-10-CM | POA: Diagnosis present

## 2021-01-05 DIAGNOSIS — E119 Type 2 diabetes mellitus without complications: Secondary | ICD-10-CM | POA: Insufficient documentation

## 2021-01-05 DIAGNOSIS — S0081XA Abrasion of other part of head, initial encounter: Secondary | ICD-10-CM | POA: Insufficient documentation

## 2021-01-05 DIAGNOSIS — S0990XA Unspecified injury of head, initial encounter: Secondary | ICD-10-CM | POA: Diagnosis present

## 2021-01-05 LAB — CBC
HCT: 32.9 % — ABNORMAL LOW (ref 36.0–46.0)
Hemoglobin: 10.9 g/dL — ABNORMAL LOW (ref 12.0–15.0)
MCH: 40.1 pg — ABNORMAL HIGH (ref 26.0–34.0)
MCHC: 33.1 g/dL (ref 30.0–36.0)
MCV: 121 fL — ABNORMAL HIGH (ref 80.0–100.0)
Platelets: 233 10*3/uL (ref 150–400)
RBC: 2.72 MIL/uL — ABNORMAL LOW (ref 3.87–5.11)
RDW: 15.4 % (ref 11.5–15.5)
WBC: 6.1 10*3/uL (ref 4.0–10.5)
nRBC: 0 % (ref 0.0–0.2)

## 2021-01-05 LAB — BASIC METABOLIC PANEL
Anion gap: 8 (ref 5–15)
BUN: 12 mg/dL (ref 8–23)
CO2: 25 mmol/L (ref 22–32)
Calcium: 9.3 mg/dL (ref 8.9–10.3)
Chloride: 107 mmol/L (ref 98–111)
Creatinine, Ser: 0.75 mg/dL (ref 0.44–1.00)
GFR, Estimated: >60 mL/min (ref >60)
Glucose, Bld: 69 mg/dL — ABNORMAL LOW (ref 70–99)
Potassium: 4 mmol/L (ref 3.5–5.1)
Sodium: 140 mmol/L (ref 135–145)

## 2021-01-05 LAB — URINALYSIS, ROUTINE W REFLEX MICROSCOPIC
Bilirubin Urine: NEGATIVE
Glucose, UA: >=500 mg/dL — AB
Ketones, ur: NEGATIVE mg/dL
Leukocytes,Ua: NEGATIVE
Nitrite: NEGATIVE
Protein, ur: NEGATIVE mg/dL
Specific Gravity, Urine: 1.007 (ref 1.005–1.030)
pH: 6 (ref 5.0–8.0)

## 2021-01-05 LAB — CBG MONITORING, ED
Glucose-Capillary: 74 mg/dL (ref 70–99)
Glucose-Capillary: 42 mg/dL — CL (ref 70–99)
Glucose-Capillary: 141 mg/dL — ABNORMAL HIGH (ref 70–99)
Glucose-Capillary: 71 mg/dL (ref 70–99)
Glucose-Capillary: 175 mg/dL — ABNORMAL HIGH (ref 70–99)

## 2021-01-05 MED ORDER — TETANUS-DIPHTH-ACELL PERTUSSIS 5-2.5-18.5 LF-MCG/0.5 IM SUSY
0.5000 mL | PREFILLED_SYRINGE | Freq: Once | INTRAMUSCULAR | Status: AC
  Administered 2021-01-05: 22:00:00 0.5 mL via INTRAMUSCULAR
  Filled 2021-01-05: qty 0.5

## 2021-01-05 MED ORDER — SODIUM CHLORIDE 0.9% FLUSH: 3.0000 mL | INTRAVENOUS | Status: DC | PRN

## 2021-01-05 MED ORDER — SODIUM CHLORIDE 0.9 % IV SOLN: 250.0000 mL | INTRAVENOUS | Status: DC | PRN

## 2021-01-05 MED ORDER — SODIUM CHLORIDE 0.9% FLUSH: 3.0000 mL | Freq: Two times a day (BID) | INTRAVENOUS | Status: DC

## 2021-01-05 NOTE — ED Notes (Signed)
Patient verbalizes understanding of discharge instructions. Opportunity for questioning and answers were provided. Armband removed by staff, pt discharged from ED ambulatory to wheelchair. Pt placed in lobby via wheel chair until family member gets here.

## 2021-01-05 NOTE — Discharge Instructions (Addendum)
STOP taking FARXIGA. Do NOT take your insulin tonight.  Call your doctor first thing in the morning to establish what other medication changes if any should occur for your low glucose.  It is very important to eat regularly throughout the day to help prevent low blood sugar like you been having this past week.  Do not take insulin if you are not eating.  As far as a head injury perspective, your CT scan was okay.  If, however, you notice a headache, vision changes, vomiting, confusion, numbness or weakness, or any other new/concerning symptoms then return to the ER as you could develop delayed bleeding.

## 2021-01-05 NOTE — ED Triage Notes (Signed)
Pt here via [facility] from PCP for fall. Pt was getting out of car around 1330, foot got caught in running board and pt fell face first into cement. No LOC, no dizziness or lightheadedness. Pt is on eliquis. Pt went to PCP who sent her here, for low blood sugar - per PCP it was 63. CBG w/ EMS was 103. Pt reports blood sugar has been low all week. aox4

## 2021-01-05 NOTE — Progress Notes (Signed)
Orthopedic Tech Progress Note Patient Details:  [name] August 24, 1950 [id number] Level 2 Trauma Patient ID: [name], female   DOB: May 08, 1950, 70 y.o.   MRN: [id number]  [name] 01/05/2021, 5:13 PM

## 2021-01-05 NOTE — ED Provider Notes (Addendum)
[facility] EMERGENCY DEPARTMENT Provider Note   CSN: [id number] Arrival date & time: 01/05/21  1703     History Chief Complaint  Patient presents with   Wanda Plump Tywanna Seifer is a 70 y.o. female.  HPI 70 year old female presents with head injury.  History is via patient and EMS.  The patient tripped getting out of her car at around 1:30 PM going to an appointment.  Struck her head on the ground.  No loss of consciousness and no headache or vomiting.  No dizziness or vision changes.  She is on Eliquis. No current or recent illness besides last month.  However she does endorse that she has been having low blood sugars for the last week.  She states a couple times a day will run low, sometimes as low as 30 or 40.  She has not had any recent change medicines and taking her insulin and other diabetes medicines.  She has not eaten today.  Past Medical History:  Diagnosis Date   Abnormal LFTs    AICD (automatic cardioverter/defibrillator) present    Angina decubitus ([place]) 11/06/2012   Angina pectoris ([place])    Anxiety    Arthritis    Back pain    Bundle branch block 05/2016   CAD (coronary artery disease) 06/18/2010   CHF (congestive heart failure) (HCC)    Chronic combined systolic and diastolic CHF, NYHA class 3 (HCC)    Chronic combined systolic and diastolic heart failure (HCC)    Chronic systolic dysfunction of left ventricle 09/24/2012   Coronary artery disease    s/p CABG 2007   Depression    Dermal mycosis    Diabetes mellitus    type II   DKA (diabetic ketoacidoses) 01/12/2019   DVT (deep venous thrombosis) ([place]) 09/2015   bilateral   Edema, lower extremity    Glaucoma    Hyperlipidemia    Hypertension    Hypoglycemia 09/09/2012   Insomnia    Insulin dependent diabetes mellitus ([place]) 04/23/2012   Ischemic cardiomyopathy    EF 30-35%, s/p ICD 4/08 by Dr Leonia Reeves   Joint pain    LBBB (left bundle branch block) 09/24/2012   Morbid obesity ([place])  11/06/2012   Obesity    Oxygen dependent 2 L   Pain in left knee    Peripheral neuropathy    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   LIMA to D2, SVG to ramus intermediate, sequential SVG to OM1-OM2, SVG to RCA with EVH via both legs    Sleep apnea    uses O2 at night   Tinea    Type 2 diabetes mellitus with hyperglycemia, with long-term current use of insulin ([place]) 04/20/2019   Uncontrolled diabetes mellitus 02/2019   Ventricular tachycardia 01/16/15   sustained VT terminated with ATP, CL 340 msec   Vitamin B 12 deficiency 11/05/2018   Vitamin D deficiency 11/05/2018    Patient Active Problem List   Diagnosis Date Noted   Adnexal mass 12/23/2020   Polyneuropathy associated with underlying disease ([place]) 09/01/2020   Acute pansinusitis 03/18/2020   Diabetic peripheral neuropathy ([place]) 03/18/2020   Urine finding 03/18/2020   Aortic atherosclerosis ([place]) 01/13/2020   Diabetes mellitus with coincident hypertension ([place]) 01/13/2020   Type 2 diabetes mellitus with diabetic polyneuropathy, with long-term current use of insulin ([place]) 07/16/2019   Type 2 diabetes mellitus with hyperglycemia, with long-term current use of insulin ([place]) 04/20/2019   Diabetes mellitus ([place]) 04/20/2019   Uncontrolled diabetes  mellitus 02/20/2019   Pressure injury of skin 02/20/2019   Demand ischemia (HCC)    Abnormal LFTs    Acute pancreatitis 01/12/2019   DKA (diabetic ketoacidoses) 01/12/2019   Vitamin D deficiency 11/05/2018   Vitamin B 12 deficiency 11/05/2018   Osteoarthritis of knee 07/02/2018   Tinea    Sleep apnea    Oxygen dependent    Obesity    Insomnia    Hyperlipidemia    Dermal mycosis    Depression    Coronary artery disease    CHF (congestive heart failure) (HCC)    Arthritis    Anxiety    ICD (implantable cardioverter-defibrillator) in place    Heme positive stool    Benign neoplasm of transverse colon    Bundle branch block 05/08/2016   Left leg pain    DVT (deep venous thrombosis) ([place])  09/30/2015   Syncope 03/11/2015   AKI (acute kidney injury) ([place]) 03/11/2015   Hypotension 03/11/2015   Syncope and collapse 03/11/2015   Ventricular tachycardia 01/17/2015   Pain in the chest    SOB (shortness of breath)    Thyroid nodule    CAP (community acquired pneumonia)    Chest pain 05/26/2014   Right flank pain 05/26/2014   Right flank discomfort    Chronic combined systolic and diastolic heart failure (HCC)    Angina pectoris ([place])    Morbid obesity ([place]) 11/06/2012   Angina decubitus ([place]) 11/06/2012   LBBB (left bundle branch block) 66/44/0347   Chronic systolic dysfunction of left ventricle 09/24/2012   Hypoglycemia 09/09/2012   HLD (hyperlipidemia) 04/23/2012   Insulin dependent diabetes mellitus ([place]) 04/23/2012   Acute renal failure ([place]) 03/10/2012   Ischemic cardiomyopathy 06/18/2010   CAD (coronary artery disease) 06/18/2010   Hypertension 06/18/2010   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    Past Surgical History:  Procedure Laterality Date   BI-VENTRICULAR IMPLANTABLE CARDIOVERTER DEFIBRILLATOR UPGRADE N/A 09/25/2012   Procedure: BI-VENTRICULAR IMPLANTABLE CARDIOVERTER DEFIBRILLATOR UPGRADE;  Surgeon: Coralyn Mark, MD;  Location: [facility] CATH LAB;  Service: Cardiovascular;  Laterality: N/A;   BREAST BIOPSY Right 04/05/2014   CARDIAC CATHETERIZATION     CARDIAC DEFIBRILLATOR PLACEMENT  4/08   by Dr Leonia Reeves (MDT)   COLONOSCOPY WITH PROPOFOL N/A 10/12/2016   Procedure: COLONOSCOPY WITH PROPOFOL;  Surgeon: Doran Stabler, MD;  Location: [facility] ENDOSCOPY;  Service: Gastroenterology;  Laterality: N/A;   CORONARY ARTERY BYPASS GRAFT  10/05/2005   by Dr Cyndia Bent   IMPLANTABLE CARDIOVERTER DEFIBRILLATOR IMPLANT  09/25/12   attempt of upgrade to CRT-D unsuccessful due to CS anatomy, SJM Unify [place] device placed with LV port capped by Dr Rayann Heman   IR GENERIC HISTORICAL  10/03/2015   IR US GUIDE VASC ACCESS LEFT 10/03/2015 Jacqulynn Cadet, MD [facility]   IR GENERIC HISTORICAL   10/03/2015   IR VENO/EXT/UNI LEFT 10/03/2015 Jacqulynn Cadet, MD [facility]   IR GENERIC HISTORICAL  10/03/2015   IR VENOCAVAGRAM IVC 10/03/2015 Jacqulynn Cadet, MD [facility]   IR GENERIC HISTORICAL  10/03/2015   IR INFUSION THROMBOL VENOUS INITIAL (MS) 10/03/2015 Jacqulynn Cadet, MD [facility]   IR GENERIC HISTORICAL  10/03/2015   IR US GUIDE VASC ACCESS LEFT 10/03/2015 Jacqulynn Cadet, MD [facility]   IR GENERIC HISTORICAL  10/04/2015   IR THROMBECT VENO [facility] MOD SED 10/04/2015 Sandi Mariscal, MD [facility]   IR GENERIC HISTORICAL  10/04/2015   IR TRANSCATH PLC STENT 1ST ART NOT LE CV CAR VERT CAR 10/04/2015  Sandi Mariscal, MD [facility]   IR GENERIC HISTORICAL  10/04/2015   IR THROMB F/U EVAL ART/VEN FINAL DAY (MS) 10/04/2015 Sandi Mariscal, MD [facility]   IR GENERIC HISTORICAL  11/02/2015   IR RADIOLOGIST EVAL & MGMT 11/02/2015 Sandi Mariscal, MD [facility]   IR RADIOLOGIST EVAL & MGMT  04/26/2016   LEFT HEART CATH AND CORS/GRAFTS ANGIOGRAPHY N/A 01/19/2019   Procedure: LEFT HEART CATH AND CORS/GRAFTS ANGIOGRAPHY;  Surgeon: Belva Crome, MD;  Location: [facility] LAB;  Service: Cardiovascular;  Laterality: N/A;     OB History     Gravida  4   Para  4   Term      Preterm      AB      Living         SAB      IAB      Ectopic      Multiple      Live Births              Family History  Problem Relation Age of Onset   Diabetes Mother 18       died - HTN   Stroke Mother    High blood pressure Mother    Sudden death Mother    Obesity Mother    Other Other        No early family hx of CAD    Social History   Tobacco Use   Smoking status: Never   Smokeless tobacco: Never  Vaping Use   Vaping Use: Never used  Substance Use Topics   Alcohol use: No   Drug use: No    Home Medications Prior to Admission medications   Medication Sig Start Date End Date Taking? Authorizing Provider  albuterol (PROVENTIL) (2.5 MG/3ML) 0.083% nebulizer  solution Take 2.5 mg by nebulization every 6 (six) hours as needed for wheezing or shortness of breath.  09/30/15  Yes [provider]  amitriptyline (ELAVIL) 10 MG tablet Take 10 mg by mouth daily. 07/25/16  Yes [provider]  apixaban (ELIQUIS) 5 MG TABS tablet Take 1 tablet (5 mg total) by mouth 2 (two) times daily. 03/24/20  Yes Angelia Mould, MD  Baclofen 5 MG TABS Take 5 mg by mouth 2 (two) times daily.   Yes [provider]  Calcium Carb-Cholecalciferol 600-400 MG-UNIT TABS Take 1 tablet by mouth daily.   Yes [provider]  carvedilol (COREG) 6.25 MG tablet Take 1 tablet (6.25 mg total) by mouth 2 (two) times daily with a meal. 02/22/19  Yes Vann, Jessica U, DO  ciprofloxacin (CIPRO) 500 MG tablet Take 500 mg by mouth 2 (two) times daily. Start date : 12/27/20   Yes [provider]  cyanocobalamin (,VITAMIN B-12,) 1000 MCG/ML injection 1,000 mcg every 30 (thirty) days.   Yes [provider]  ferrous sulfate 325 (65 FE) MG EC tablet Take 325 mg by mouth daily.    Yes [provider]  fluconazole (DIFLUCAN) 150 MG tablet Take 150 mg by mouth daily.   Yes Millsaps, Joelene Millin, NP  furosemide (LASIX) 40 MG tablet TAKE 1 TABLET EVERY DAY Patient taking differently: Take 40 mg by mouth daily. 05/25/20  Yes Jerline Pain, MD  gabapentin (NEURONTIN) 300 MG capsule Take 1 capsule (300 mg total) by mouth at bedtime. 12/23/20  Yes Shamleffer, Melanie Crazier, MD  insulin degludec (TRESIBA FLEXTOUCH) 100 UNIT/ML FlexTouch Pen Inject 24 Units into the skin daily. 05/26/20  Yes Shamleffer, Melanie Crazier, MD  isosorbide dinitrate (ISORDIL) 30 MG tablet Take 30 mg by mouth every morning.   Yes Millsaps, Joelene Millin, NP  latanoprost (XALATAN) 0.005 % ophthalmic solution Place 1 drop into both eyes at bedtime.   Yes [provider]  lisinopril (ZESTRIL) 2.5 MG tablet TAKE 1 TABLET EVERY DAY Patient taking differently: Take 2.5 mg by  mouth daily. 03/28/20  Yes Jerline Pain, MD  meloxicam (MOBIC) 15 MG tablet Take 15 mg by mouth daily. 11/26/20  Yes [provider]  methocarbamol (ROBAXIN) 500 MG tablet Take 1 tablet (500 mg total) by mouth 2 (two) times daily as needed for muscle spasms. 02/22/19  Yes Geradine Girt, DO  nitroGLYCERIN (NITROSTAT) 0.4 MG SL tablet Place 0.4 mg under the tongue every 5 (five) minutes as needed for chest pain.   Yes [provider]  NOVOLOG FLEXPEN 100 UNIT/ML FlexPen Max daily 30 units per scale Patient taking differently: 6-12 Units daily as needed for high blood sugar. Max daily 30 units per scale. Sliding scale 08/31/20  Yes Shamleffer, Melanie Crazier, MD  potassium chloride SA (KLOR-CON) 20 MEQ tablet Take 20 mEq by mouth daily as needed (for low pottassium). 02/23/20  Yes [provider]  rosuvastatin (CRESTOR) 20 MG tablet Take 1 tablet (20 mg total) by mouth daily at 6 PM. 10/07/15  Yes Minus Liberty, MD  traMADol (ULTRAM) 50 MG tablet Take 50 mg by mouth daily. 11/28/15  Yes [provider]  Vitamin D, Ergocalciferol, (DRISDOL) 1.25 MG (50000 UT) CAPS capsule Take 1 capsule (50,000 Units total) by mouth every 7 (seven) days. 12/30/18  Yes Jearld Lesch A, DO  Alcohol Swabs (ALCOHOL WIPES) 70 % PADS DropSafe Alcohol Prep Pads  USE WHEN CHECKING BLOOD SUGARS    [provider]  Continuous Blood Gluc Sensor (DEXCOM G6 SENSOR) MISC 1 Device by Does not apply route as directed. 02/25/20   Shamleffer, Melanie Crazier, MD  Continuous Blood Gluc Transmit (DEXCOM G6 TRANSMITTER) MISC 1 Device by Does not apply route as directed. 02/25/20   Shamleffer, Melanie Crazier, MD  DROPLET PEN NEEDLES 32G X 4 MM MISC USE IN THE MORNING, AT NOON, IN THE EVENING, AND AT BEDTIME. 03/21/20   Shamleffer, Melanie Crazier, MD    Allergies    Lipitor  [atorvastatin calcium], Metformin and related, Ozempic (0.25 or 0.5 mg-dose) [semaglutide(0.25 or 0.5mg -dos)],  Atorvastatin, and Levofloxacin  Review of Systems   Review of Systems  HENT:         No facial pain  Respiratory:  Negative for shortness of breath.   Cardiovascular:  Negative for chest pain.  Gastrointestinal:  Negative for vomiting.  Genitourinary:  Negative for dysuria.  Musculoskeletal:  Negative for neck pain.  Skin:  Positive for wound.  Neurological:  Negative for dizziness, weakness, numbness and headaches.  All other systems reviewed and are negative.  Physical Exam Updated Vital Signs BP (!) 146/67    Pulse 76    Temp 99.5 F (37.5 C) (Oral)    Resp (!) 27    Ht 5\' 2"  (1.575 m)    Wt 100.7 kg    SpO2 100%    BMI 40.60 kg/m   Physical Exam Vitals and nursing note reviewed.  Constitutional:      Appearance: She is well-developed.  HENT:     Head: Normocephalic. Abrasion present.      Right Ear: External ear normal.     Left Ear: External ear normal.  Nose: Nose normal.      Comments: No maxillofacial tenderness Eyes:     General:        Right eye: No discharge.        Left eye: No discharge.     Extraocular Movements: Extraocular movements intact.     Pupils: Pupils are equal, round, and reactive to light.  Cardiovascular:     Rate and Rhythm: Normal rate and regular rhythm.     Heart sounds: Normal heart sounds.  Pulmonary:     Effort: Pulmonary effort is normal.     Breath sounds: Normal breath sounds.  Abdominal:     Palpations: Abdomen is soft.     Tenderness: There is no abdominal tenderness.  Musculoskeletal:     Cervical back: No spinous process tenderness.  Skin:    General: Skin is warm and dry.  Neurological:     Mental Status: She is alert.     Comments: CN 3-12 grossly intact. 5/5 strength in all 4 extremities. Grossly normal sensation. Normal finger to nose.   Psychiatric:        Mood and Affect: Mood is not anxious.    ED Results / Procedures / Treatments   Labs (all labs ordered are listed, but only abnormal results are  displayed) Labs Reviewed  BASIC METABOLIC PANEL - Abnormal; Notable for the following components:      Result Value   Glucose, Bld 69 (*)    All other components within normal limits  CBC - Abnormal; Notable for the following components:   RBC 2.72 (*)    Hemoglobin 10.9 (*)    HCT 32.9 (*)    MCV 121.0 (*)    MCH 40.1 (*)    All other components within normal limits  URINALYSIS, ROUTINE W REFLEX MICROSCOPIC - Abnormal; Notable for the following components:   Color, Urine STRAW (*)    Glucose, UA >=500 (*)    Hgb urine dipstick LARGE (*)    Bacteria, UA RARE (*)    All other components within normal limits  CBG MONITORING, ED - Abnormal; Notable for the following components:   Glucose-Capillary 42 (*)    All other components within normal limits  CBG MONITORING, ED - Abnormal; Notable for the following components:   Glucose-Capillary 141 (*)    All other components within normal limits  CBG MONITORING, ED - Abnormal; Notable for the following components:   Glucose-Capillary 175 (*)    All other components within normal limits  CBG MONITORING, ED  CBG MONITORING, ED    EKG EKG Interpretation  Date/Time:  Thursday January 05 2021 17:14:30 EST Ventricular Rate:  70 PR Interval:  183 QRS Duration: 156 QT Interval:  450 QTC Calculation: 486 R Axis:   125 Text Interpretation: Sinus rhythm IVCD, consider atypical RBBB Confirmed by Sherwood Gambler [phone]) on 01/05/2021 8:36:16 PM  Radiology CT Head Wo Contrast  Result Date: 01/05/2021 CLINICAL DATA:  Trauma, fall EXAM: CT HEAD WITHOUT CONTRAST TECHNIQUE: Contiguous axial images were obtained from the base of the skull through the vertex without intravenous contrast. COMPARISON:  11/21/2015 FINDINGS: Brain: There are no signs of bleeding within the cranium. Cortical sulci are prominent. There are old lacunar infarcts in the basal ganglia with no significant interval change. There is decreased density in the subcortical and  periventricular white matter. There is subtle decreased density in the subcortical region in the right frontal lobe, possibly old infarct. Vascular: There are scattered vascular calcifications. Skull: No displaced  fracture is seen in the calvarium. There is soft tissue swelling in the frontal scalp suggesting contusion/hematoma in the scalp. Sinuses/Orbits: There are no air-fluid levels or mucosal thickening in the sinuses. Other: No significant interval changes are noted. IMPRESSION: No acute intracranial findings are seen in noncontrast CT brain. Atrophy. Small-vessel disease. 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 Electronically Signed   By: Elmer Picker M.D.   On: 01/05/2021 17:53    Procedures Procedures   Medications Ordered in ED Medications  sodium chloride flush (NS) 0.9 % injection 3 mL (3 mLs Intravenous Not Given 01/05/21 2129)  sodium chloride flush (NS) 0.9 % injection 3 mL (3 mLs Intravenous Not Given 01/05/21 1714)  0.9 %  sodium chloride infusion (has no administration in time range)  Tdap (BOOSTRIX) injection 0.5 mL (has no administration in time range)    ED Course  I have reviewed the triage vital signs and the nursing notes.  Pertinent labs & imaging results that were available during my care of the patient were reviewed by me and considered in my medical decision making (see chart for details).    MDM Rules/Calculators/A&P                         Patient has 2 main issues.  From a head injury perspective, CT head is unremarkable.  Tetanus immunization will be updated.  From a hypoglycemia perspective, I suspect a lot of this is from taking her [place] and insulin while not eating food.  For now, I discussed do not take insulin tonight and stop the [place] and call PCP in morning.  After eating food here, her hypoglycemia has normalized into normal to high glucose.  She has been observed and has not had a recurrence of hypoglycemia.  No other  obvious infection.  Appears stable for discharge. This was a mechanical fall    Final Clinical Impression(s) / ED Diagnoses Final diagnoses:  Injury of forehead, initial encounter  Hypoglycemia    Rx / DC Orders ED Discharge Orders     None        Sherwood Gambler, MD 01/05/21 2157    Sherwood Gambler, MD 01/05/21 2157

## 2021-01-05 NOTE — Progress Notes (Signed)
HPI: Patient is a 70 y.o. G4P4 who LMP was No LMP recorded. Patient is postmenopausal., presents today for a problem visit.  She complains of recent findings of Left ovarion cyst by CT - Pelvis.  Pt has had symptoms of none, she denies LLQ pain, bloating.  Previous evaluation: She had CT for other reasons with 5 cm cyst noted in right adnexal region.  Appears simple.  Pt reports no prior h/o cysts or Gyn conditions.  Also: Pt noted to have tripped and fallen in parking lot on the way in to appointment today.  She hit front of face and forehead.  Denies headache or dizziness, although does report pain and bleeding from abrasions.  PMHx: She  has a past medical history of Abnormal LFTs, AICD (automatic cardioverter/defibrillator) present, Angina decubitus ([place] ) (11/06/2012), Angina pectoris ([place]), Anxiety, Arthritis, Back pain, Bundle branch block (05/2016), CAD (coronary artery disease) (06/18/2010), CHF (congestive heart failure) ([place]), Chronic combined systolic and diastolic CHF, NYHA class 3 ([place]), Chronic combined systolic and diastolic heart failure ([place]), Chronic systolic dysfunction of left ventricle (09/24/2012), Coronary artery disease, Depression, Dermal mycosis, Diabetes mellitus, DKA (diabetic ketoacidoses) (01/12/2019), DVT (deep venous thrombosis) ([place]) (09/2015), Edema, lower extremity, Glaucoma, Hyperlipidemia, Hypertension, Hypoglycemia (09/09/2012), Insomnia, Insulin dependent diabetes mellitus ([place]) (04/23/2012), Ischemic cardiomyopathy, Joint pain, LBBB (left bundle branch block) (09/24/2012), Morbid obesity ([place]) (11/06/2012), Obesity, Oxygen dependent (2 L), Pain in left knee, Peripheral neuropathy, S/P CABG x 5 (10/05/2005), Sleep apnea, Tinea, Type 2 diabetes mellitus with hyperglycemia, with long-term current use of insulin ([place]) (04/20/2019), Uncontrolled diabetes mellitus (02/2019), Ventricular tachycardia (01/16/15), Vitamin B 12 deficiency (11/05/2018), and Vitamin D deficiency  (11/05/2018). Also,  has a past surgical history that includes Cardiac catheterization; Cardiac defibrillator placement (4/08); Coronary artery bypass graft (10/05/2005); Implantable cardioverter defibrillator implant (09/25/12); bi-ventricular implantable cardioverter defibrillator upgrade (N/A, 09/25/2012); ir generic historical (10/03/2015); ir generic historical (10/03/2015); ir generic historical (10/03/2015); ir generic historical (10/03/2015); ir generic historical (10/03/2015); ir generic historical (10/04/2015); ir generic historical (10/04/2015); ir generic historical (10/04/2015); ir generic historical (11/02/2015); Breast biopsy (Right, 04/05/2014); IR Radiologist Eval & Mgmt (04/26/2016); Colonoscopy with propofol (N/A, 10/12/2016); and LEFT HEART CATH AND CORS/GRAFTS ANGIOGRAPHY (N/A, 01/19/2019)., family history includes Diabetes (age of onset: 41) in her mother; High blood pressure in her mother; Obesity in her mother; Other in an other family member; Stroke in her mother; Sudden death in her mother.,  reports that she has never smoked. She has never used smokeless tobacco. She reports that she does not drink alcohol and does not use drugs.  She has a current medication list which includes the following prescription(s): albuterol, alcohol wipes, alendronate, amitriptyline, amlodipine benzoate, apixaban, baclofen, calcium carb-cholecalciferol, capsaicin, carvedilol, dexcom g6 sensor, dexcom g6 transmitter, cyclobenzaprine, dapagliflozin propanediol, droplet pen needles, ferrous sulfate, fluconazole, furosemide, gabapentin, tresiba flextouch, isosorbide dinitrate, latanoprost, lisinopril, meloxicam, methocarbamol, nitroglycerin, novolog flexpen, potassium chloride sa, rosuvastatin, sulfamethoxazole-trimethoprim, tramadol, and vitamin d (ergocalciferol). Also, is allergic to lipitor  [atorvastatin calcium], metformin and related, ozempic (0.25 or 0.5 mg-dose) [semaglutide(0.25 or 0.5mg -dos)], atorvastatin, and  levofloxacin.  Review of Systems  Constitutional:  Positive for malaise/fatigue. Negative for chills and fever.  HENT:  Negative for congestion, sinus pain and sore throat.   Eyes:  Negative for blurred vision and pain.  Respiratory:  Negative for cough and wheezing.   Cardiovascular:  Negative for chest pain and leg swelling.  Gastrointestinal:  Negative for abdominal pain, constipation, diarrhea, heartburn, nausea and vomiting.  Genitourinary:  Negative for dysuria, frequency, hematuria and  urgency.  Musculoskeletal:  Negative for back pain, joint pain, myalgias and neck pain.  Skin:  Negative for itching and rash.  Neurological:  Negative for dizziness, tremors and weakness.  Endo/Heme/Allergies:  Does not bruise/bleed easily.  Psychiatric/Behavioral:  Negative for depression. The patient is not nervous/anxious and does not have insomnia.    Objective: BP (!) 150/90    Ht 5\' 2"  (1.575 m)    Wt 222 lb (100.7 kg)    BMI 40.60 kg/m  Physical Exam Constitutional:      General: She is not in acute distress.    Appearance: She is well-developed.  Musculoskeletal:        General: Normal range of motion.  Neurological:     Mental Status: She is alert and oriented to person, place, and time.  Skin:    General: Skin is warm and dry.  Vitals reviewed.    ASSESSMENT/PLAN:    Problem List Items Addressed This Visit       Other   Adnexal mass - Primary   Relevant Orders   Ovarian Malignancy Risk-ROMA   US PELVIC COMPLETE WITH TRANSVAGINAL  Discussed risks of ovarian cysts and indeed cancer Follow up after labs and [name] [facility] referral as concerns amount  Barnett Applebaum, MD, [facility], [facility] 01/05/2021  1:58 PM

## 2021-01-06 LAB — PREMENOPAUSAL INTERP: HIGH

## 2021-01-06 LAB — OVARIAN MALIGNANCY RISK-ROMA
Cancer Antigen (CA) 125: 14.6 U/mL (ref 0.0–38.1)
HE4: 101 pmol/L — ABNORMAL HIGH (ref 0.0–96.9)
Postmenopausal ROMA: 2.1
Premenopausal ROMA: 3 — ABNORMAL HIGH

## 2021-01-06 LAB — POSTMENOPAUSAL INTERP: LOW

## 2021-01-13 ENCOUNTER — Ambulatory Visit (INDEPENDENT_AMBULATORY_CARE_PROVIDER_SITE_OTHER): Payer: Medicare HMO | Admitting: Podiatry

## 2021-01-13 ENCOUNTER — Encounter: Payer: Self-pay | Admitting: Podiatry

## 2021-01-13 ENCOUNTER — Other Ambulatory Visit: Payer: Self-pay

## 2021-01-13 DIAGNOSIS — E1151 Type 2 diabetes mellitus with diabetic peripheral angiopathy without gangrene: Secondary | ICD-10-CM

## 2021-01-13 DIAGNOSIS — B351 Tinea unguium: Secondary | ICD-10-CM

## 2021-01-13 DIAGNOSIS — M79675 Pain in left toe(s): Secondary | ICD-10-CM

## 2021-01-13 DIAGNOSIS — M79674 Pain in right toe(s): Secondary | ICD-10-CM | POA: Diagnosis not present

## 2021-01-18 ENCOUNTER — Ambulatory Visit (HOSPITAL_COMMUNITY)
Admission: RE | Admit: 2021-01-18 | Discharge: 2021-01-18 | Disposition: A | Discharge status: Home / Self Care | Payer: Medicare HMO | Source: Ambulatory Visit | Attending: Obstetrics & Gynecology | Admitting: Obstetrics & Gynecology

## 2021-01-18 ENCOUNTER — Other Ambulatory Visit: Payer: Self-pay

## 2021-01-18 DIAGNOSIS — N9489 Other specified conditions associated with female genital organs and menstrual cycle: Secondary | ICD-10-CM | POA: Insufficient documentation

## 2021-01-18 NOTE — Progress Notes (Signed)
°  Subjective:  Patient ID: [name], female    DOB: May 11, 1950,  MRN: [id number]  71 y.o. female presents at risk foot care. Pt has h/o NIDDM with PAD and painful elongated mycotic toenails 1-5 bilaterally which are tender when wearing enclosed shoe gear. Pain is relieved with periodic professional debridement.  Patient states blood glucose was 156 mg/dl today.    PCP is Everardo Beals, NP , and last visit was 12/25/2020.  Allergies  Allergen Reactions   Lipitor  [Atorvastatin Calcium] Rash   Metformin And Related Itching, Swelling and Other (See Comments)    Leg pain & swelling in legs   Ozempic (0.25 Or 0.5 Mg-Dose) [Semaglutide(0.25 Or 0.5mg -Dos)] Other (See Comments)    Pt with hx of pancreatitis    Atorvastatin Itching and Rash   Levofloxacin Itching    Review of Systems: Negative except as noted in the HPI.   Objective:  Vascular Examination: CFT <4 seconds b/l LE. Nonpalpable DP/PT pulses b/l. Pedal hair absent b/l. Skin temperature gradient warm to cool b/l. No pain with calf compression b/l. No cyanosis or clubbing noted b/l LE. Trace edema noted BLE.  Neurological Examination: Protective sensation decreased with 10 gram monofilament b/l.  Dermatological Examination: Pedal skin thin and atrophic b/l LE. No open wounds b/l LE. No interdigital macerations noted b/l LE. Toenails 1-5 b/l elongated, discolored, dystrophic, thickened, crumbly with subungual debris and tenderness to dorsal palpation.  Musculoskeletal Examination: Muscle strength 5/5 to b/l LE. No pain, crepitus or joint limitation noted with ROM bilateral LE. Pes planus deformity noted bilateral LE.  Radiographs: None  Last A1c:   Hemoglobin A1C Latest Ref Rng & Units 12/23/2020 09/02/2020 05/26/2020 02/25/2020  HGBA1C 4.0 - 5.6 % 6.3(A) 6.8(A) 7.0(A) 6.1(A)  Some recent data might be hidden   Assessment:   1. Pain due to onychomycosis of toenails of both feet   2. Type II diabetes mellitus with  peripheral circulatory disorder (HCC)    Plan:  -Examined patient. -Ordered arterial doppler and ABIs for diminished pulses with h/o NIDDM, PAD and hyperlipidemia. -Continue foot and shoe inspections daily. Monitor blood glucose per PCP/Endocrinologist's recommendations. -Mycotic toenails 1-5 bilaterally were debrided in length and girth with sterile nail nippers and dremel without incident. -Patient/POA to call should there be question/concern in the interim.  Return in about 3 months (around 04/13/2021).  Marzetta Board, DPM

## 2021-01-19 ENCOUNTER — Other Ambulatory Visit: Payer: Self-pay | Admitting: Obstetrics & Gynecology

## 2021-01-19 DIAGNOSIS — N9489 Other specified conditions associated with female genital organs and menstrual cycle: Secondary | ICD-10-CM

## 2021-01-19 NOTE — Progress Notes (Signed)
Referral to [facility] clinic for surgery.

## 2021-01-23 ENCOUNTER — Ambulatory Visit (INDEPENDENT_AMBULATORY_CARE_PROVIDER_SITE_OTHER): Payer: Medicare HMO

## 2021-01-23 DIAGNOSIS — I255 Ischemic cardiomyopathy: Secondary | ICD-10-CM | POA: Diagnosis not present

## 2021-01-24 ENCOUNTER — Telehealth: Payer: Self-pay | Admitting: Internal Medicine

## 2021-01-24 LAB — CUP PACEART REMOTE DEVICE CHECK
Battery Remaining Longevity: 24 mo
Battery Remaining Percentage: 23 %
Battery Voltage: 2.8 V
Brady Statistic AP VP Percent: 1 %
Brady Statistic AP VS Percent: 1 %
Brady Statistic AS VP Percent: 1 %
Brady Statistic AS VS Percent: 99 %
Brady Statistic RA Percent Paced: 1 %
Brady Statistic RV Percent Paced: 1 %
Date Time Interrogation Session: 20230116125239
HighPow Impedance: 36 Ohm
HighPow Impedance: 37 Ohm
Implantable Lead Implant Date: 20080423
Implantable Lead Implant Date: 20080423
Implantable Lead Implant Date: 20140918
Implantable Lead Location: 753858
Implantable Lead Location: 753859
Implantable Lead Location: 753860
Implantable Lead Model: 5076
Implantable Lead Model: 6947
Implantable Pulse Generator Implant Date: 20140918
Lead Channel Impedance Value: 390 Ohm
Lead Channel Impedance Value: 440 Ohm
Lead Channel Pacing Threshold Amplitude: 0.5 V
Lead Channel Pacing Threshold Amplitude: 0.5 V
Lead Channel Pacing Threshold Pulse Width: 0.5 ms
Lead Channel Pacing Threshold Pulse Width: 0.5 ms
Lead Channel Sensing Intrinsic Amplitude: 0.9 mV
Lead Channel Sensing Intrinsic Amplitude: 12 mV
Lead Channel Setting Pacing Amplitude: 2 V
Lead Channel Setting Pacing Amplitude: 2.5 V
Lead Channel Setting Pacing Pulse Width: 0.5 ms
Lead Channel Setting Sensing Sensitivity: 0.5 mV
Pulse Gen Serial Number: 7070007

## 2021-01-24 MED ORDER — DEXCOM G6 SENSOR MISC: 1.0000 | 3 refills | Status: DC

## 2021-01-24 NOTE — Telephone Encounter (Signed)
MEDICATION: Continuous Blood Gluc Sensor (DEXCOM G6 SENSOR) MISC  PHARMACY:   [facility] ([place]), [place] - [address] Phone:  [phone]  Fax:  [phone]      HAS THE PATIENT CONTACTED THEIR PHARMACY?  YES  IS THIS A 90 DAY SUPPLY : 30 DAYS  IS PATIENT OUT OF MEDICATION: YES  IF NOT; HOW MUCH IS LEFT:   LAST APPOINTMENT DATE: @12 /16/2022  NEXT APPOINTMENT DATE:@4 /21/2023  DO WE HAVE YOUR PERMISSION TO LEAVE A DETAILED MESSAGE?:  OTHER COMMENTS:    **Let patient know to contact pharmacy at the end of the day to make sure medication is ready. **  ** Please notify patient to allow 48-72 hours to process**  **Encourage patient to contact the pharmacy for refills or they can request refills through [facility]**

## 2021-01-24 NOTE — Telephone Encounter (Signed)
Script sent  

## 2021-01-25 ENCOUNTER — Telehealth: Payer: Self-pay | Admitting: Internal Medicine

## 2021-01-25 MED ORDER — DEXCOM G6 SENSOR MISC: 1.0000 | 3 refills | Status: DC

## 2021-01-25 NOTE — Telephone Encounter (Signed)
Script has been sent to [facility] per patient request

## 2021-01-25 NOTE — Telephone Encounter (Signed)
Vm left for patient to callback. Please advise patient that Dexcom doesn't have a patient assistance program.

## 2021-01-25 NOTE — Telephone Encounter (Signed)
Patient request a call back concerning the Dexcom G6. Her insurance will not provide coverage. I mentioned patient assistance and she would like more information on this. She can be reached at [phone]

## 2021-01-29 NOTE — Progress Notes (Signed)
Cardiology Office Note Date:  01/31/2021  Patient ID:  [name], [name] January 06, 1951, MRN [id number] PCP:  Everardo Beals, NP  Cardiologist:  Dr. Marlou Porch Electrophysiologist: Dr. Rayann Heman    Chief Complaint: annual visit  History of Present Illness: [name] is a 71 y.o. female with history of CAD (CABG 2007), ICM, chronic CHF (systlic), DM (w/hx of DKA), DVT, HTN, HLD, LBBB, obesity, OSA (w/O2 at [facility]), VT, ICD  She comes today to be seen for Dr. Rayann Heman, last sen by him Jan 2022, noted she had not had further ICD therapies since her event a year prior. LV port plugged, the pt not interested in pursuing CTS eval for epicardial LV lead No changes were made, planned for annual EP visits  Most recently she saw Tonny Branch, NP , Aug 2022, she was doing well, felt to be euvolemic, on Eliquis for DVT hx No changes were made.   TTE 01/13/19, difficult study though LVEF 50%  01/05/21: ER visit with a trip/fall  TODAY She is doing very well No CP, palpitations or cardiac awareness. No near syncope or syncope No SOB, denies symptoms of PND or orthopnea, wear O2 at HS for her OSA. No bleeding or signs of bleeding  The fall last month was a trip/fall, she feels like she has good stability, does not fall regularly, say her shoe got stuck in car threshold getting out of a SUV.   Device information [employer] ICD implanted 05/01/2006 >> attempts at CRT upgrade and gen change 09/25/2012  (No suitable veins for LV lead) CRT device, though LV port is plugged + appropriate therapy 01/18/2019, VF   Past Medical History:  Diagnosis Date   Abnormal LFTs    AICD (automatic cardioverter/defibrillator) present    Angina decubitus ([place]) 11/06/2012   Angina pectoris ([place])    Anxiety    Arthritis    Back pain    Bundle branch block 05/2016   CAD (coronary artery disease) 06/18/2010   CHF (congestive heart failure) (HCC)    Chronic combined systolic and diastolic CHF, NYHA class 3 (HCC)     Chronic combined systolic and diastolic heart failure (HCC)    Chronic systolic dysfunction of left ventricle 09/24/2012   Coronary artery disease    s/p CABG 2007   Depression    Dermal mycosis    Diabetes mellitus    type II   DKA (diabetic ketoacidoses) 01/12/2019   DVT (deep venous thrombosis) ([place]) 09/2015   bilateral   Edema, lower extremity    Glaucoma    Hyperlipidemia    Hypertension    Hypoglycemia 09/09/2012   Insomnia    Insulin dependent diabetes mellitus ([place]) 04/23/2012   Ischemic cardiomyopathy    EF 30-35%, s/p ICD 4/08 by Dr Leonia Reeves   Joint pain    LBBB (left bundle branch block) 09/24/2012   Morbid obesity ([place]) 11/06/2012   Obesity    Oxygen dependent 2 L   Pain in left knee    Peripheral neuropathy    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   LIMA to D2, SVG to ramus intermediate, sequential SVG to OM1-OM2, SVG to RCA with EVH via both legs    Sleep apnea    uses O2 at night   Tinea    Type 2 diabetes mellitus with hyperglycemia, with long-term current use of insulin ([place]) 04/20/2019   Uncontrolled diabetes mellitus 02/2019   Ventricular tachycardia 01/16/15   sustained VT terminated with ATP, CL 340 msec  Vitamin B 12 deficiency 11/05/2018   Vitamin D deficiency 11/05/2018    Past Surgical History:  Procedure Laterality Date   BI-VENTRICULAR IMPLANTABLE CARDIOVERTER DEFIBRILLATOR UPGRADE N/A 09/25/2012   Procedure: BI-VENTRICULAR IMPLANTABLE CARDIOVERTER DEFIBRILLATOR UPGRADE;  Surgeon: Coralyn Mark, MD;  Location: [facility] CATH LAB;  Service: Cardiovascular;  Laterality: N/A;   BREAST BIOPSY Right 04/05/2014   CARDIAC CATHETERIZATION     CARDIAC DEFIBRILLATOR PLACEMENT  4/08   by Dr Leonia Reeves (MDT)   COLONOSCOPY WITH PROPOFOL N/A 10/12/2016   Procedure: COLONOSCOPY WITH PROPOFOL;  Surgeon: Doran Stabler, MD;  Location: [facility] ENDOSCOPY;  Service: Gastroenterology;  Laterality: N/A;   CORONARY ARTERY BYPASS GRAFT  10/05/2005   by Dr Cyndia Bent   IMPLANTABLE CARDIOVERTER  DEFIBRILLATOR IMPLANT  09/25/12   attempt of upgrade to CRT-D unsuccessful due to CS anatomy, SJM Unify [place] device placed with LV port capped by Dr Rayann Heman   IR GENERIC HISTORICAL  10/03/2015   IR US GUIDE VASC ACCESS LEFT 10/03/2015 Jacqulynn Cadet, MD [facility]   IR GENERIC HISTORICAL  10/03/2015   IR VENO/EXT/UNI LEFT 10/03/2015 Jacqulynn Cadet, MD [facility]   IR GENERIC HISTORICAL  10/03/2015   IR VENOCAVAGRAM IVC 10/03/2015 Jacqulynn Cadet, MD [facility]   IR GENERIC HISTORICAL  10/03/2015   IR INFUSION THROMBOL VENOUS INITIAL (MS) 10/03/2015 Jacqulynn Cadet, MD [facility]   IR GENERIC HISTORICAL  10/03/2015   IR US GUIDE VASC ACCESS LEFT 10/03/2015 Jacqulynn Cadet, MD [facility]   IR GENERIC HISTORICAL  10/04/2015   IR THROMBECT VENO MECH MOD SED 10/04/2015 Sandi Mariscal, MD [facility]   IR GENERIC HISTORICAL  10/04/2015   IR TRANSCATH PLC STENT 1ST ART NOT LE CV CAR VERT CAR 10/04/2015 Sandi Mariscal, MD [facility]   IR GENERIC HISTORICAL  10/04/2015   IR THROMB F/U EVAL ART/VEN FINAL DAY (MS) 10/04/2015 Sandi Mariscal, MD [facility]   IR GENERIC HISTORICAL  11/02/2015   IR RADIOLOGIST EVAL & MGMT 11/02/2015 Sandi Mariscal, MD [facility]   IR RADIOLOGIST EVAL & MGMT  04/26/2016   LEFT HEART CATH AND CORS/GRAFTS ANGIOGRAPHY N/A 01/19/2019   Procedure: LEFT HEART CATH AND CORS/GRAFTS ANGIOGRAPHY;  Surgeon: Belva Crome, MD;  Location: [facility];  Service: Cardiovascular;  Laterality: N/A;    Current Outpatient Medications  Medication Sig Dispense Refill   albuterol (PROVENTIL) (2.5 MG/3ML) 0.083% nebulizer solution Take 2.5 mg by nebulization every 6 (six) hours as needed for wheezing or shortness of breath.      Alcohol Swabs (ALCOHOL WIPES) 70 % PADS DropSafe Alcohol Prep Pads  USE WHEN CHECKING BLOOD SUGARS     amitriptyline (ELAVIL) 10 MG tablet Take 10 mg by mouth daily.     apixaban (ELIQUIS) 5 MG TABS tablet Take 1 tablet (5 mg total) by mouth 2 (two) times  daily. 60 tablet 12   Baclofen 5 MG TABS Take 5 mg by mouth 2 (two) times daily.     Calcium Carb-Cholecalciferol 600-400 MG-UNIT TABS Take 1 tablet by mouth daily.     carvedilol (COREG) 6.25 MG tablet Take 1 tablet (6.25 mg total) by mouth 2 (two) times daily with a meal. 60 tablet 0   cephALEXin (KEFLEX) 500 MG capsule cephalexin 500 mg capsule  TAKE 1 CAPSULE BY MOUTH 4 TIMES DAILY FOR 7 DAYS     ciprofloxacin (CIPRO) 500 MG tablet Take 500 mg by mouth 2 (two) times daily. Start date : 12/27/20     Continuous  Blood Gluc Sensor (DEXCOM G6 SENSOR) MISC 1 Device by Does not apply route as directed. 9 each 3   Continuous Blood Gluc Transmit (DEXCOM G6 TRANSMITTER) MISC 1 Device by Does not apply route as directed. 1 each 3   cyanocobalamin (,VITAMIN B-12,) 1000 MCG/ML injection 1,000 mcg every 30 (thirty) days.     DROPLET PEN NEEDLES 32G X 4 MM MISC USE IN THE MORNING, AT NOON, IN THE EVENING, AND AT BEDTIME. 400 each 3   ferrous sulfate 325 (65 FE) MG EC tablet Take 325 mg by mouth daily.      fluconazole (DIFLUCAN) 150 MG tablet Take 150 mg by mouth daily.     furosemide (LASIX) 40 MG tablet TAKE 1 TABLET EVERY DAY (Patient taking differently: Take 40 mg by mouth daily.) 90 tablet 3   gabapentin (NEURONTIN) 300 MG capsule Take 1 capsule (300 mg total) by mouth at bedtime. 90 capsule 3   insulin degludec (TRESIBA FLEXTOUCH) 100 UNIT/ML FlexTouch Pen Inject 24 Units into the skin daily. 30 mL 3   isosorbide dinitrate (ISORDIL) 30 MG tablet Take 30 mg by mouth every morning.     latanoprost (XALATAN) 0.005 % ophthalmic solution Place 1 drop into both eyes at bedtime.     lisinopril (ZESTRIL) 2.5 MG tablet TAKE 1 TABLET EVERY DAY (Patient taking differently: Take 2.5 mg by mouth daily.) 90 tablet 3   meloxicam (MOBIC) 15 MG tablet Take 15 mg by mouth daily.     methocarbamol (ROBAXIN) 500 MG tablet Take 1 tablet (500 mg total) by mouth 2 (two) times daily as needed for muscle spasms.      nitroGLYCERIN (NITROSTAT) 0.4 MG SL tablet Place 0.4 mg under the tongue every 5 (five) minutes as needed for chest pain.     NOVOLOG FLEXPEN 100 UNIT/ML FlexPen Max daily 30 units per scale (Patient taking differently: 6-12 Units daily as needed for high blood sugar. Max daily 30 units per scale. Sliding scale) 30 mL 3   potassium chloride SA (KLOR-CON) 20 MEQ tablet Take 20 mEq by mouth daily as needed (for low pottassium).     rosuvastatin (CRESTOR) 20 MG tablet Take 1 tablet (20 mg total) by mouth daily at 6 PM. 30 tablet 2   traMADol (ULTRAM) 50 MG tablet Take 50 mg by mouth daily.     Vitamin D, Ergocalciferol, (DRISDOL) 1.25 MG (50000 UT) CAPS capsule Take 1 capsule (50,000 Units total) by mouth every 7 (seven) days. 4 capsule 0   No current facility-administered medications for this visit.    Allergies:   Lipitor  [atorvastatin calcium], Metformin and related, Ozempic (0.25 or 0.5 mg-dose) [semaglutide(0.25 or 0.5mg -dos)], Atorvastatin, and Levofloxacin   Social History:  The patient  reports that she has never smoked. She has never used smokeless tobacco. She reports that she does not drink alcohol and does not use drugs.   Family History:  The patient's family history includes Diabetes (age of onset: 74) in her mother; High blood pressure in her mother; Obesity in her mother; Other in an other family member; Stroke in her mother; Sudden death in her mother.  ROS:  Please see the history of present illness.    All other systems are reviewed and otherwise negative.   PHYSICAL EXAM:  VS:  BP 140/82    Pulse 65    Ht 5\' 2"  (1.575 m)    Wt 226 lb 3.2 oz (102.6 kg)    SpO2 99%    BMI 41.37  kg/m  BMI: Body mass index is 41.37 kg/m. Well nourished, well developed, in no acute distress HEENT: normocephalic, atraumatic Neck: no JVD, carotid bruits or masses Cardiac:  RRR; no significant murmurs, no rubs, or gallops Lungs:  CTA b/l, no wheezing, rhonchi or rales Abd: soft, nontender MS:  no deformity or atrophy Ext: no edema Skin: warm and dry, no rash Neuro:  No gross deficits appreciated Psych: euthymic mood, full affect  ICD site is stable, no tethering or discomfort   EKG:  not done today  Device interrogation done today and reviewed by myself:  Battery and lead measurements are good AMS episodes (since 2017) are only 26, longest is 10 seconds, all Ar either an At or Aflutter No VT   01/19/2019: LHC Severe diffuse three-vessel coronary disease with functional total occlusion of the mid LAD, total occlusion of the large first diagonal, total occlusion of the proximal ramus intermedius, total occlusion of the proximal circumflex, and total occlusion of the mid RCA.  All vessels are heavily calcified. Patent saphenous vein graft to the PDA.  Moderate diffuse disease beyond the graft insertion site Patent sequential saphenous vein graft to the first and second obtuse marginal.  Diffuse native vessel disease beyond the bypass graft insertion site. Patent saphenous vein graft to the ramus intermedius.  Diffuse native vessel disease beyond the graft insertion site. Patent left internal mammary graft to the dominant diagonal.  Diffuse native vessel disease beyond the graft insertion site. Chronic systolic heart failure with EF less than 35% and LVEDP 22 mmHg.   01/13/2019: TTE IMPRESSIONS   1. Suboptimal image quality for diagnosis of biventricular function and  wall motion.   2. Left ventricular ejection fraction, by visual estimation, is 50%. The  left ventricle has limited views for evaluation of function. Possible  apical wall motion abnormality. There is mildly increased left ventricular  hypertrophy.   3. Global right ventricle was not well visualized.The right ventricular  size is not well visualized. Right vetricular wall thickness was not  assessed.   4. Left atrial size was not well visualized.   5. Right atrial size was not well visualized.   6. Mild mitral  annular calcification.   7. The mitral valve is abnormal. No evidence of mitral valve  regurgitation. No evidence of mitral stenosis.   8. The tricuspid valve is not well visualized.   9. The aortic valve was not well visualized. Aortic valve regurgitation  is not visualized. No evidence of aortic valve sclerosis or stenosis.   Recent Labs: 11/30/2020: ALT 29 01/05/2021: BUN 12; Creatinine, Ser 0.75; Hemoglobin 10.9; Platelets 233; Potassium 4.0; Sodium 140  No results found for requested labs within last 8760 hours.   CrCl cannot be calculated (Patient's most recent lab result is older than the maximum 21 days allowed.).   Wt Readings from Last 3 Encounters:  01/31/21 226 lb 3.2 oz (102.6 kg)  01/05/21 222 lb 0.1 oz (100.7 kg)  01/05/21 222 lb (100.7 kg)     Other studies reviewed: Additional studies/records reviewed today include: summarized above  ASSESSMENT AND PLAN:  ICD Intact function  No programming changes made  CAD No anginal symptoms Sees Dr. Marlou Porch next month  ICM Chronic CHF Improved LVEF by her last echo No symptoms or exam findings of volume OL CorVue is above threshold She gained a few pounds, she says 2/2 visiting family and dietary liberalization She does not feel like she is retaining fluid.   Disposition: F/u with remotes  as usual, EP in  clinic in a year, sooner if needed, will transition to Dr. Quentin Ore for EP management.  Current medicines are reviewed at length with the patient today.  The patient did not have any concerns regarding medicines.  Venetia Night, PA-C 01/31/2021 2:03 PM     [place] [address] [phone] (office)  [phone] (fax)

## 2021-01-31 ENCOUNTER — Encounter: Payer: Self-pay | Admitting: Physician Assistant

## 2021-01-31 ENCOUNTER — Ambulatory Visit (INDEPENDENT_AMBULATORY_CARE_PROVIDER_SITE_OTHER): Payer: Medicare HMO | Admitting: Physician Assistant

## 2021-01-31 ENCOUNTER — Other Ambulatory Visit: Payer: Self-pay

## 2021-01-31 VITALS — BP 140/82 | HR 65 | Ht 62.0 in | Wt 226.2 lb

## 2021-01-31 DIAGNOSIS — I5042 Chronic combined systolic (congestive) and diastolic (congestive) heart failure: Secondary | ICD-10-CM

## 2021-01-31 DIAGNOSIS — I255 Ischemic cardiomyopathy: Secondary | ICD-10-CM | POA: Diagnosis not present

## 2021-01-31 DIAGNOSIS — I519 Heart disease, unspecified: Secondary | ICD-10-CM | POA: Diagnosis not present

## 2021-01-31 DIAGNOSIS — Z95810 Presence of automatic (implantable) cardiac defibrillator: Secondary | ICD-10-CM | POA: Diagnosis not present

## 2021-01-31 LAB — CUP PACEART INCLINIC DEVICE CHECK
Battery Remaining Longevity: 26 mo
Brady Statistic RA Percent Paced: 0.52 %
Brady Statistic RV Percent Paced: 0 %
Date Time Interrogation Session: 20230124141136
HighPow Impedance: 36.3827
Implantable Lead Implant Date: 20080423
Implantable Lead Implant Date: 20080423
Implantable Lead Implant Date: 20140918
Implantable Lead Location: 753858
Implantable Lead Location: 753859
Implantable Lead Location: 753860
Implantable Lead Model: 5076
Implantable Lead Model: 6947
Implantable Pulse Generator Implant Date: 20140918
Lead Channel Impedance Value: 387.5 Ohm
Lead Channel Impedance Value: 475 Ohm
Lead Channel Pacing Threshold Amplitude: 0.5 V
Lead Channel Pacing Threshold Amplitude: 0.5 V
Lead Channel Pacing Threshold Amplitude: 0.75 V
Lead Channel Pacing Threshold Amplitude: 0.75 V
Lead Channel Pacing Threshold Pulse Width: 0.5 ms
Lead Channel Pacing Threshold Pulse Width: 0.5 ms
Lead Channel Pacing Threshold Pulse Width: 0.5 ms
Lead Channel Pacing Threshold Pulse Width: 0.5 ms
Lead Channel Sensing Intrinsic Amplitude: 1.1 mV
Lead Channel Sensing Intrinsic Amplitude: 12 mV
Lead Channel Setting Pacing Amplitude: 2 V
Lead Channel Setting Pacing Amplitude: 2.5 V
Lead Channel Setting Pacing Pulse Width: 0.5 ms
Lead Channel Setting Sensing Sensitivity: 0.5 mV
Pulse Gen Serial Number: 7070007

## 2021-01-31 NOTE — Patient Instructions (Signed)
Medication Instructions:   Your physician recommends that you continue on your current medications as directed. Please refer to the Current Medication list given to you today.  *If you need a refill on your cardiac medications before your next appointment, please call your pharmacy*   Lab Work: [place]       If you have labs (blood work) drawn today and your tests are completely normal, you will receive your results only by: [place] (if you have MyChart) OR A paper copy in the mail If you have any lab test that is abnormal or we need to change your treatment, we will call you to review the results.   Testing/Procedures: NONE ORDERED  TODAY    Follow-Up:a At [facility], you and your health needs are our priority.  As part of our continuing mission to provide you with exceptional heart care, we have created designated Provider Care Teams.  These Care Teams include your primary Cardiologist (physician) and Advanced Practice Providers (APPs -  Physician Assistants and Nurse Practitioners) who all work together to provide you with the care you need, when you need it.  We recommend signing up for the patient portal called "MyChart".  Sign up information is provided on this After Visit Summary.  MyChart is used to connect with patients for Virtual Visits (Telemedicine).  Patients are able to view lab/test results, encounter notes, upcoming appointments, etc.  Non-urgent messages can be sent to your provider as well.   To learn more about what you can do with MyChart, go to [email or web address].    Your next appointment:   1 year(s)  The format for your next appointment:   In Person  Provider:   Allegra Lai, MD   Other Instructions

## 2021-02-01 ENCOUNTER — Ambulatory Visit (HOSPITAL_COMMUNITY)
Admission: RE | Admit: 2021-02-01 | Discharge: 2021-02-01 | Disposition: A | Discharge status: Home / Self Care | Payer: Medicare HMO | Source: Ambulatory Visit | Attending: Podiatry | Admitting: Podiatry

## 2021-02-01 DIAGNOSIS — E1151 Type 2 diabetes mellitus with diabetic peripheral angiopathy without gangrene: Secondary | ICD-10-CM | POA: Insufficient documentation

## 2021-02-01 NOTE — Progress Notes (Signed)
ABI has been completed.   Preliminary results in CV Proc.   [name] 02/01/2021 1:47 PM

## 2021-02-01 NOTE — Progress Notes (Signed)
Remote ICD transmission.   

## 2021-02-03 ENCOUNTER — Other Ambulatory Visit (INDEPENDENT_AMBULATORY_CARE_PROVIDER_SITE_OTHER): Payer: Medicare HMO | Admitting: Podiatry

## 2021-02-03 DIAGNOSIS — E1151 Type 2 diabetes mellitus with diabetic peripheral angiopathy without gangrene: Secondary | ICD-10-CM

## 2021-02-03 DIAGNOSIS — R6889 Other general symptoms and signs: Secondary | ICD-10-CM

## 2021-02-03 NOTE — Progress Notes (Signed)
Ordered Vascular Consultation for abnormal LE ABI/TBI. Patient has multiple risk factors/comorbidities.

## 2021-02-07 ENCOUNTER — Ambulatory Visit (INDEPENDENT_AMBULATORY_CARE_PROVIDER_SITE_OTHER): Payer: Medicare HMO

## 2021-02-07 ENCOUNTER — Telehealth: Payer: Self-pay

## 2021-02-07 DIAGNOSIS — I5042 Chronic combined systolic (congestive) and diastolic (congestive) heart failure: Secondary | ICD-10-CM

## 2021-02-07 DIAGNOSIS — Z95810 Presence of automatic (implantable) cardiac defibrillator: Secondary | ICD-10-CM

## 2021-02-07 NOTE — Telephone Encounter (Signed)
Spoke with patient and advised device monitor is disconnected.  She said she will connect and requested to send remote transmission today and she agreed.

## 2021-02-08 NOTE — Progress Notes (Signed)
EPIC Encounter for ICM Monitoring  Patient Name: [name] is a 71 y.o. female Date: 02/08/2021 Primary Care Physican: Everardo Beals, NP Primary Cardiologist: Skains/Weaver PA Electrophysiologist: Allred 02/08/2021 Weight: 226 lbs   AT/AF Burden: <1% (taking Eliquis)                                                          Spoke with patient and heart failure questions reviewed.  Pt asymptomatic for fluid accumulation.  Reports feeling well at this time and voices no complaints.     CorVue thoracic impedance suggesting normal fluid levels.   Prescribed: Furosemide 40 mg take 1 tablet daily Potassium 20 mEq take 1 tablet daily   Labs: 01/05/2021 Creatinine 0.75, BUN 12, Potassium 4.0, Sodium 140, GFR >60 11/30/2020 Creatinine 1.29, BUN 21, Potassium 3.6, Sodium 135, GFR 45 A complete set of results can be found in Results Review.   Recommendations:  No changes and encouraged to call if experiencing any fluid symptoms.   Next ICM clinic phone appointment due: 03/13/2021.     Next 91 day remote transmission due: 04/24/2021.     EP/Cardiology Office Visits:  02/10/2021 with Dr Marlou Porch.  Recall 01/26/2022 with Dr Quentin Ore.   Copy of ICM check sent to Dr. Rayann Heman.     3 month ICM trend: 02/07/2021.    12-14 Month ICM trend:     [name], RN 02/08/2021 4:17 PM

## 2021-02-09 ENCOUNTER — Telehealth: Payer: Self-pay | Admitting: *Deleted

## 2021-02-09 NOTE — Telephone Encounter (Signed)
-----   Message from Marzetta Board, DPM sent at 02/03/2021 10:57 AM EST ----- Regarding: Referral to Vascular Surgery for Abnormal LE ABIs Please let patient know her vascular testing confirmed poor circulation in her feet and I will be referring her to a Vascular Specialist for a Consultation.   Thanks!

## 2021-02-09 NOTE — Telephone Encounter (Signed)
Returned the call to patient giving results , verbalized understanding and appointment has been scheduled for 02/16/21 vascular and vein.

## 2021-02-09 NOTE — Telephone Encounter (Signed)
Called patient,no answer, left vmessage for call back concerning ABI results.

## 2021-02-10 ENCOUNTER — Other Ambulatory Visit: Payer: Self-pay

## 2021-02-10 ENCOUNTER — Ambulatory Visit (INDEPENDENT_AMBULATORY_CARE_PROVIDER_SITE_OTHER): Payer: Medicare HMO | Admitting: Cardiology

## 2021-02-10 ENCOUNTER — Encounter: Payer: Self-pay | Admitting: Cardiology

## 2021-02-10 DIAGNOSIS — I5042 Chronic combined systolic (congestive) and diastolic (congestive) heart failure: Secondary | ICD-10-CM

## 2021-02-10 DIAGNOSIS — I2510 Atherosclerotic heart disease of native coronary artery without angina pectoris: Secondary | ICD-10-CM

## 2021-02-10 DIAGNOSIS — Z01810 Encounter for preprocedural cardiovascular examination: Secondary | ICD-10-CM | POA: Diagnosis not present

## 2021-02-10 NOTE — Assessment & Plan Note (Signed)
Continue with optimized goal-directed medical therapy.  She is on carvedilol 6.25 mg twice a day.  Isosorbide.  Low-dose lisinopril.  Overall fairly euvolemic.

## 2021-02-10 NOTE — Progress Notes (Signed)
Cardiology Office Note:    Date:  02/10/2021   ID:  [name], DOB 22-Nov-1950, MRN [id number]  PCP:  Everardo Beals, NP   [facility] Providers Cardiologist:  Candee Furbish, MD Electrophysiologist:  Thompson Grayer, MD     Referring MD: Everardo Beals, NP    History of Present Illness:    [name] is a 71 y.o. female here for the follow-up of coronary disease with ischemic cardiomyopathy left bundle branch block prior VT, ICD.  Dr. Rayann Heman used to take care of her.  Her LV port is plugged.  Not interested in pursuing cardiothoracic evaluation for epicardial LV lead.  Echo in 2021 showed EF of approximately 50%.  Back in December 2022 she had a fall/trip.  Past Medical History:  Diagnosis Date   Abnormal LFTs    AICD (automatic cardioverter/defibrillator) present    Angina decubitus ([place]) 11/06/2012   Angina pectoris ([place])    Anxiety    Arthritis    Back pain    Bundle branch block 05/2016   CAD (coronary artery disease) 06/18/2010   CHF (congestive heart failure) (HCC)    Chronic combined systolic and diastolic CHF, NYHA class 3 (HCC)    Chronic combined systolic and diastolic heart failure (HCC)    Chronic systolic dysfunction of left ventricle 09/24/2012   Coronary artery disease    s/p CABG 2007   Depression    Dermal mycosis    Diabetes mellitus    type II   DKA (diabetic ketoacidoses) 01/12/2019   DVT (deep venous thrombosis) ([place]) 09/2015   bilateral   Edema, lower extremity    Glaucoma    Hyperlipidemia    Hypertension    Hypoglycemia 09/09/2012   Insomnia    Insulin dependent diabetes mellitus ([place]) 04/23/2012   Ischemic cardiomyopathy    EF 30-35%, s/p ICD 4/08 by Dr Leonia Reeves   Joint pain    LBBB (left bundle branch block) 09/24/2012   Morbid obesity ([place]) 11/06/2012   Obesity    Oxygen dependent 2 L   Pain in left knee    Peripheral neuropathy    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   LIMA to D2, SVG to ramus intermediate, sequential SVG to  OM1-OM2, SVG to RCA with EVH via both legs    Sleep apnea    uses O2 at night   Tinea    Type 2 diabetes mellitus with hyperglycemia, with long-term current use of insulin ([place]) 04/20/2019   Uncontrolled diabetes mellitus 02/2019   Ventricular tachycardia 01/16/15   sustained VT terminated with ATP, CL 340 msec   Vitamin B 12 deficiency 11/05/2018   Vitamin D deficiency 11/05/2018    Past Surgical History:  Procedure Laterality Date   BI-VENTRICULAR IMPLANTABLE CARDIOVERTER DEFIBRILLATOR UPGRADE N/A 09/25/2012   Procedure: BI-VENTRICULAR IMPLANTABLE CARDIOVERTER DEFIBRILLATOR UPGRADE;  Surgeon: Coralyn Gearold Wainer, MD;  Location: [facility] C[facility] LAB;  Service: Cardiovascular;  Laterality: N/A;   BREAST BIOPSY Right 04/05/2014   CARDIAC CATHETERIZATION     CARDIAC DEFIBRILLATOR PLACEMENT  4/08   by Dr Leonia Reeves (MDT)   COLONOSCOPY WITH PROPOFOL N/A 10/12/2016   Procedure: COLONOSCOPY WITH PROPOFOL;  Surgeon: Doran Stabler, MD;  Location: [facility];  Service: Gastroenterology;  Laterality: N/A;   CORONARY ARTERY BYPASS GRAFT  10/05/2005   by Dr Cyndia Bent   IMPLANTABLE CARDIOVERTER DEFIBRILLATOR IMPLANT  09/25/12   attempt of upgrade to CRT-D unsuccessful due to CS anatomy, SJM Unify [place] device placed with LV port capped by  Dr Rayann Heman   IR GENERIC HISTORICAL  10/03/2015   IR US GUIDE VASC ACCESS LEFT 10/03/2015 Jacqulynn Cadet, MD [facility]   IR GENERIC HISTORICAL  10/03/2015   IR VENO/EXT/UNI LEFT 10/03/2015 Jacqulynn Cadet, MD [facility]   IR GENERIC HISTORICAL  10/03/2015   IR VENOCAVAGRAM IVC 10/03/2015 Jacqulynn Cadet, MD [facility]   IR GENERIC HISTORICAL  10/03/2015   IR INFUSION THROMBOL VENOUS INITIAL (MS) 10/03/2015 Jacqulynn Cadet, MD [facility]   IR GENERIC HISTORICAL  10/03/2015   IR US GUIDE VASC ACCESS LEFT 10/03/2015 Jacqulynn Cadet, MD [facility]   IR GENERIC HISTORICAL  10/04/2015   IR THROMBECT VENO MECH MOD SED 10/04/2015 Sandi Mariscal, MD [facility]   IR GENERIC  HISTORICAL  10/04/2015   IR TRANSCATH PLC STENT 1ST ART NOT LE CV CAR VERT CAR 10/04/2015 Sandi Mariscal, MD [facility]   IR GENERIC HISTORICAL  10/04/2015   IR THROMB F/U EVAL ART/VEN FINAL DAY (MS) 10/04/2015 Sandi Mariscal, MD [facility]   IR GENERIC HISTORICAL  11/02/2015   IR RADIOLOGIST EVAL & MGMT 11/02/2015 Sandi Mariscal, MD [facility]   IR RADIOLOGIST EVAL & MGMT  04/26/2016   LEFT HEART CATH AND CORS/GRAFTS ANGIOGRAPHY N/A 01/19/2019   Procedure: LEFT HEART CATH AND CORS/GRAFTS ANGIOGRAPHY;  Surgeon: Belva Crome, MD;  Location: [facility];  Service: Cardiovascular;  Laterality: N/A;    Current Medications: Current Meds  Medication Sig   albuterol (PROVENTIL) (2.5 MG/3ML) 0.083% nebulizer solution Take 2.5 mg by nebulization every 6 (six) hours as needed for wheezing or shortness of breath.    Alcohol Swabs (ALCOHOL WIPES) 70 % PADS DropSafe Alcohol Prep Pads  USE WHEN CHECKING BLOOD SUGARS   amitriptyline (ELAVIL) 10 MG tablet Take 10 mg by mouth daily.   apixaban (ELIQUIS) 5 MG TABS tablet Take 1 tablet (5 mg total) by mouth 2 (two) times daily.   Baclofen 5 MG TABS Take 5 mg by mouth 2 (two) times daily.   Calcium Carb-Cholecalciferol 600-400 MG-UNIT TABS Take 1 tablet by mouth daily.   carvedilol (COREG) 6.25 MG tablet Take 1 tablet (6.25 mg total) by mouth 2 (two) times daily with a meal.   ciprofloxacin (CIPRO) 500 MG tablet Take 500 mg by mouth 2 (two) times daily. Start date : 12/27/20   Continuous Blood Gluc Sensor (DEXCOM G6 SENSOR) MISC 1 Device by Does not apply route as directed.   Continuous Blood Gluc Transmit (DEXCOM G6 TRANSMITTER) MISC 1 Device by Does not apply route as directed.   cyanocobalamin (,VITAMIN B-12,) 1000 MCG/ML injection 1,000 mcg every 30 (thirty) days.   DROPLET PEN NEEDLES 32G X 4 MM MISC USE IN THE MORNING, AT NOON, IN THE EVENING, AND AT BEDTIME.   ferrous sulfate 325 (65 FE) MG EC tablet Take 325 mg by mouth daily.    fluconazole  (DIFLUCAN) 150 MG tablet Take 150 mg by mouth daily.   furosemide (LASIX) 40 MG tablet TAKE 1 TABLET EVERY DAY (Patient taking differently: Take 40 mg by mouth daily.)   gabapentin (NEURONTIN) 300 MG capsule Take 1 capsule (300 mg total) by mouth at bedtime.   insulin degludec (TRESIBA FLEXTOUCH) 100 UNIT/ML FlexTouch Pen Inject 24 Units into the skin daily.   isosorbide dinitrate (ISORDIL) 30 MG tablet Take 30 mg by mouth every morning.   latanoprost (XALATAN) 0.005 % ophthalmic solution Place 1 drop into both eyes at bedtime.   lisinopril (ZESTRIL) 2.5 MG tablet TAKE 1 TABLET  EVERY DAY (Patient taking differently: Take 2.5 mg by mouth daily.)   meloxicam (MOBIC) 15 MG tablet Take 15 mg by mouth daily.   methocarbamol (ROBAXIN) 500 MG tablet Take 1 tablet (500 mg total) by mouth 2 (two) times daily as needed for muscle spasms.   nitroGLYCERIN (NITROSTAT) 0.4 MG SL tablet Place 0.4 mg under the tongue every 5 (five) minutes as needed for chest pain.   NOVOLOG FLEXPEN 100 UNIT/ML FlexPen Max daily 30 units per scale (Patient taking differently: 6-12 Units daily as needed for high blood sugar. Max daily 30 units per scale. Sliding scale)   potassium chloride SA (KLOR-CON) 20 MEQ tablet Take 20 mEq by mouth daily as needed (for low pottassium).   rosuvastatin (CRESTOR) 20 MG tablet Take 1 tablet (20 mg total) by mouth daily at 6 PM.   traMADol (ULTRAM) 50 MG tablet Take 50 mg by mouth daily.   Vitamin D, Ergocalciferol, (DRISDOL) 1.25 MG (50000 UT) CAPS capsule Take 1 capsule (50,000 Units total) by mouth every 7 (seven) days.     Allergies:   Lipitor  [atorvastatin calcium], Metformin and related, Ozempic (0.25 or 0.5 mg-dose) [semaglutide(0.25 or 0.5mg -dos)], Atorvastatin, and Levofloxacin   Social History   Socioeconomic History   Marital status: Legally Separated    Spouse name: Not on file   Number of children: 3   Years of education: 17   Highest education level: Not on file   Occupational History   Occupation: retired    Fish farm manager: UNEMPLOYED  Tobacco Use   Smoking status: Never   Smokeless tobacco: Never  Vaping Use   Vaping Use: Never used  Substance and Sexual Activity   Alcohol use: No   Drug use: No   Sexual activity: Not Currently  Other Topics Concern   Not on file  Social History Narrative   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   11/04/15 Lives with son    caffeine - coffee, 1-2 cups daily   Social Determinants of Health   Financial Resource Strain: Not on file  Food Insecurity: Not on file  Transportation Needs: Not on file  Physical Activity: Not on file  Stress: Not on file  Social Connections: Not on file     Family History: The patient's family history includes Diabetes (age of onset: 97) in her mother; High blood pressure in her mother; Obesity in her mother; Other in an other family member; Stroke in her mother; Sudden death in her mother.  ROS:   Please see the history of present illness.    No bleeding issues.  All other systems reviewed and are negative.  EKGs/Labs/Other Studies Reviewed:    The following studies were reviewed today: Device information Abbott ICD implanted 05/01/2006 >> attempts at CRT upgrade and gen change 09/25/2012  (No suitable veins for LV lead) CRT device, though LV port is plugged + appropriate therapy 01/18/2019, VF 01/19/2019: LHC Severe diffuse three-vessel coronary disease with functional total occlusion of the mid LAD, total occlusion of the large first diagonal, total occlusion of the proximal ramus intermedius, total occlusion of the proximal circumflex, and total occlusion of the mid RCA.  All vessels are heavily calcified. Patent saphenous vein graft to the PDA.  Moderate diffuse disease beyond the graft insertion site Patent sequential saphenous vein graft to the first and second obtuse marginal.  Diffuse native vessel disease beyond the bypass graft insertion  site. Patent saphenous vein graft to the ramus intermedius.  Diffuse  native vessel disease beyond the graft insertion site. Patent left internal mammary graft to the dominant diagonal.  Diffuse native vessel disease beyond the graft insertion site. Chronic systolic heart failure with EF less than 35% and LVEDP 22 mmHg.     01/13/2019: TTE IMPRESSIONS   1. Suboptimal image quality for diagnosis of biventricular function and  wall motion.   2. Left ventricular ejection fraction, by visual estimation, is 50%. The  left ventricle has limited views for evaluation of function. Possible  apical wall motion abnormality. There is mildly increased left ventricular  hypertrophy.   3. Global right ventricle was not well visualized.The right ventricular  size is not well visualized. Right vetricular wall thickness was not  assessed.   4. Left atrial size was not well visualized.   5. Right atrial size was not well visualized.   6. Mild mitral annular calcification.   7. The mitral valve is abnormal. No evidence of mitral valve  regurgitation. No evidence of mitral stenosis.   8. The tricuspid valve is not well visualized.   9. The aortic valve was not well visualized. Aortic valve regurgitation  is not visualized. No evidence of aortic valve sclerosis or stenosis.    Recent Labs: 11/30/2020: ALT 29 01/05/2021: BUN 12; Creatinine, Ser 0.75; Hemoglobin 10.9; Platelets 233; Potassium 4.0; Sodium 140  Recent Lipid Panel    Component Value Date/Time   CHOL 111 11/03/2018 1205   CHOL 90 12/22/2012 1042   TRIG 87 11/03/2018 1205   TRIG 85 12/22/2012 1042   HDL 30 (L) 11/03/2018 1205   HDL 33 (L) 12/22/2012 1042   CHOLHDL 3.6 08/05/2017 1048   CHOLHDL 4.2 04/25/2012 0520   VLDL 23 04/25/2012 0520   LDLCALC 64 11/03/2018 1205   LDLCALC 40 12/22/2012 1042     Risk Assessment/Calculations:              Physical Exam:    VS:  BP 114/80 (BP Location: Left Arm, Patient Position: Sitting,  Cuff Size: Large)    Pulse 72    Ht 5\' 2"  (1.575 m)    Wt 231 lb (104.8 kg)    SpO2 97%    BMI 42.25 kg/m     Wt Readings from Last 3 Encounters:  02/10/21 231 lb (104.8 kg)  01/31/21 226 lb 3.2 oz (102.6 kg)  01/05/21 222 lb 0.1 oz (100.7 kg)     GEN:  Well nourished, well developed in no acute distress HEENT: Normal NECK: No JVD; No carotid bruits LYMPHATICS: No lymphadenopathy CARDIAC: RRR, no murmurs, no rubs, gallops RESPIRATORY:  Clear to auscultation without rales, wheezing or rhonchi  ABDOMEN: Soft, non-tender, non-distended MUSCULOSKELETAL:  No edema; No deformity  SKIN: Warm and dry NEUROLOGIC:  Alert and oriented x 3 PSYCHIATRIC:  Normal affect   ASSESSMENT:    1. Chronic combined systolic and diastolic heart failure ([place])   2. Coronary artery disease involving native coronary artery of native heart without angina pectoris   3. Preop cardiovascular exam    PLAN:    In order of problems listed above:  Chronic combined systolic and diastolic heart failure ([place]) Continue with optimized goal-directed medical therapy.  She is on carvedilol 6.25 mg twice a day.  Isosorbide.  Low-dose lisinopril.  Overall fairly euvolemic.  CAD (coronary artery disease) No chest pain, no ischemic symptoms.  Cardiac catheterization as reviewed above.  No changes in medical management.  Preop cardiovascular exam She may proceed with ovarian cyst removal with moderate overall  cardiac risk.  She will need to hold her Eliquis for 2 days prior to surgery and resume when able.  No further cardiac testing warranted.     49-month follow-up with APP   Medication Adjustments/Labs and Tests Ordered: Current medicines are reviewed at length with the patient today.  Concerns regarding medicines are outlined above.  No orders of the defined types were placed in this encounter.  No orders of the defined types were placed in this encounter.   Patient Instructions  Medication Instructions:  The  current medical regimen is effective;  continue present plan and medications.  *If you need a refill on your cardiac medications before your next appointment, please call your pharmacy*  Follow-Up: At [facility], you and your health needs are our priority.  As part of our continuing mission to provide you with exceptional heart care, we have created designated Provider Care Teams.  These Care Teams include your primary Cardiologist (physician) and Advanced Practice Providers (APPs -  Physician Assistants and Nurse Practitioners) who all work together to provide you with the care you need, when you need it.  We recommend signing up for the patient portal called "MyChart".  Sign up information is provided on this After Visit Summary.  MyChart is used to connect with patients for Virtual Visits (Telemedicine).  Patients are able to view lab/test results, encounter notes, upcoming appointments, etc.  Non-urgent messages can be sent to your provider as well.   To learn more about what you can do with MyChart, go to [email or web address].    Your next appointment:   6 month(s)  The format for your next appointment:   In Person  Provider:   Nicholes Rough, PA-C, Melina Copa, PA-C, Ermalinda Barrios, PA-C, Christen Bame, NP, or [name], PA-C         Thank you for choosing [facility]!!      Signed, Candee Furbish, MD  02/10/2021 4:48 PM    [place]

## 2021-02-10 NOTE — Patient Instructions (Signed)
Medication Instructions:  The current medical regimen is effective;  continue present plan and medications.  *If you need a refill on your cardiac medications before your next appointment, please call your pharmacy*  Follow-Up: At [facility], you and your health needs are our priority.  As part of our continuing mission to provide you with exceptional heart care, we have created designated Provider Care Teams.  These Care Teams include your primary Cardiologist (physician) and Advanced Practice Providers (APPs -  Physician Assistants and Nurse Practitioners) who all work together to provide you with the care you need, when you need it.  We recommend signing up for the patient portal called "MyChart".  Sign up information is provided on this After Visit Summary.  MyChart is used to connect with patients for Virtual Visits (Telemedicine).  Patients are able to view lab/test results, encounter notes, upcoming appointments, etc.  Non-urgent messages can be sent to your provider as well.   To learn more about what you can do with MyChart, go to [email or web address].    Your next appointment:   6 month(s)  The format for your next appointment:   In Person  Provider:   Nicholes Rough, PA-C, Melina Copa, PA-C, Ermalinda Barrios, PA-C, Christen Bame, NP, or [name], PA-C         Thank you for choosing [facility]!!

## 2021-02-10 NOTE — Assessment & Plan Note (Signed)
She may proceed with ovarian cyst removal with moderate overall cardiac risk.  She will need to hold her Eliquis for 2 days prior to surgery and resume when able.  No further cardiac testing warranted.

## 2021-02-10 NOTE — Assessment & Plan Note (Signed)
No chest pain, no ischemic symptoms.  Cardiac catheterization as reviewed above.  No changes in medical management.

## 2021-02-15 ENCOUNTER — Other Ambulatory Visit: Payer: Self-pay

## 2021-02-15 NOTE — Patient Outreach (Signed)
[facility]) Care Management  02/15/2021  [name] 1950/02/22 [id number]  [facility] Evaluation Interviewer made contact with patient. Aging Gracefully second survey scheduled 02/17/21 @ 11.   [place] Management Assistant  [phone]

## 2021-02-16 ENCOUNTER — Ambulatory Visit (INDEPENDENT_AMBULATORY_CARE_PROVIDER_SITE_OTHER): Payer: Medicare HMO | Admitting: Vascular Surgery

## 2021-02-16 ENCOUNTER — Encounter: Payer: Self-pay | Admitting: Vascular Surgery

## 2021-02-16 ENCOUNTER — Other Ambulatory Visit: Payer: Self-pay

## 2021-02-16 VITALS — BP 169/75 | HR 72 | Temp 97.9°F | Resp 20 | Ht 62.0 in | Wt 228.0 lb

## 2021-02-16 DIAGNOSIS — I739 Peripheral vascular disease, unspecified: Secondary | ICD-10-CM

## 2021-02-16 NOTE — Progress Notes (Signed)
ASSESSMENT & PLAN   CHRONIC VENOUS INSUFFICIENCY: Based on her history and exam I think she does have evidence of chronic venous insufficiency bilaterally.  She had DVTs bilaterally.  She has hyperpigmentation consistent with CEAP C4 venous disease.  We have discussed the importance of intermittent daily leg elevation and the proper positioning for this.  In addition she will continue to wear her compression stockings.  I encouraged her to avoid prolonged sitting and standing.  We discussed importance of exercise specifically walking and water aerobics.  We also discussed the importance of maintaining a healthy weight as abdominal obesity especially increases lower extremity venous pressure.  She does have an underlying component of lymphedema and when I saw her back in March of last year we discussed considering a pneumatic compression device if her swelling got worse.  I think her swelling is actually improved some.  She is maintained on Eliquis because of her history of DVT.  PERIPHERAL ARTERIAL DISEASE: Her only significant finding on her Doppler study was a monophasic dorsalis pedis signal on the left suggesting some mild infrainguinal arterial occlusive disease on the left.  However she had a biphasic posterior tibial signal bilaterally ABIs of 100% bilaterally and toe pressures which were reasonable on both sides.  Thus I do not think that she needs any further work-up for her peripheral arterial disease at this point unless she developed rest pain or a nonhealing ulcer on her foot.  I have encouraged her to stay as active as possible.  REASON FOR CONSULT:    Abnormal ABIs.  The consult is requested by Dr. Felizardo Hoffmann.  HPI:   [name] is a 71 y.o. female who was referred for evaluation of peripheral arterial disease.  I have reviewed the records from the referring office.  The patient was seen by Dr. Elisha Ponder at [facility] on 01/13/2021.  The patient has a history  of non-insulin-dependent diabetes and had painful elongated mycotic toenails bilaterally.  Arterial Doppler studies were ordered and the patient had diminished pedal pulses.  The patient was referred for vascular consultation.  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  She says that she is undergone intervention 2 to 3 years ago but I have been unable to find those records.  I suspect this was done by interventional radiology.  She is maintained on Eliquis.  She does wear compression stockings.  She does elevate her legs some.  Elevating her legs does help her symptoms sometimes but not always.  She does describe some mild calf pain with ambulation although I think her activity is very limited.  She denies any history of rest pain or nonhealing ulcers.  Her risk factors for peripheral vascular disease include diabetes, hypertension, and hypercholesterolemia.  She denies any family history of premature cardiovascular disease.  She denies tobacco use.  She has multiple other medical issues including combined systolic and diastolic congestive heart failure, ischemic cardiomyopathy, and coronary artery disease (status post coronary revascularization in 2007).  Past Medical History:  Diagnosis Date   Abnormal LFTs    AICD (automatic cardioverter/defibrillator) present    Angina decubitus ([place]) 11/06/2012   Angina pectoris ([place])    Anxiety    Arthritis    Back pain    Bundle branch block 05/2016   CAD (coronary artery disease) 06/18/2010   CHF (congestive heart failure) (HCC)    Chronic combined  systolic and diastolic CHF, NYHA class 3 (HCC)    Chronic combined systolic and diastolic heart failure (HCC)    Chronic systolic dysfunction of left ventricle 09/24/2012   Coronary artery disease    s/p CABG 2007   Depression    Dermal mycosis    Diabetes mellitus    type II   DKA (diabetic ketoacidoses) 01/12/2019   DVT (deep venous  thrombosis) ([place]) 09/2015   bilateral   Edema, lower extremity    Glaucoma    Hyperlipidemia    Hypertension    Hypoglycemia 09/09/2012   Insomnia    Insulin dependent diabetes mellitus ([place]) 04/23/2012   Ischemic cardiomyopathy    EF 30-35%, s/p ICD 4/08 by Dr Leonia Reeves   Joint pain    LBBB (left bundle branch block) 09/24/2012   Morbid obesity ([place]) 11/06/2012   Obesity    Oxygen dependent 2 L   Pain in left knee    Peripheral neuropathy    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) 04/20/2019   Uncontrolled diabetes mellitus 02/2019   Ventricular tachycardia 01/16/15   sustained VT terminated with ATP, CL 340 msec   Vitamin B 12 deficiency 11/05/2018   Vitamin D deficiency 11/05/2018    Family History  Problem Relation Age of Onset   Diabetes Mother 50       died - HTN   Stroke Mother    High blood pressure Mother    Sudden death Mother    Obesity Mother    Other Other        No early family hx of CAD    SOCIAL HISTORY: Social History   Tobacco Use   Smoking status: Never   Smokeless tobacco: Never  Substance Use Topics   Alcohol use: No    Allergies  Allergen Reactions   Lipitor  [Atorvastatin Calcium] Rash   Metformin And Related Itching, Swelling and Other (See Comments)    Leg pain & swelling in legs   Ozempic (0.25 Or 0.5 Mg-Dose) [Semaglutide(0.25 Or 0.5mg -Dos)] Other (See Comments)    Pt with hx of pancreatitis    Atorvastatin Itching and Rash   Levofloxacin Itching    Current Outpatient Medications  Medication Sig Dispense Refill   albuterol (PROVENTIL) (2.5 MG/3ML) 0.083% nebulizer solution Take 2.5 mg by nebulization every 6 (six) hours as needed for wheezing or shortness of breath.      Alcohol Swabs (ALCOHOL WIPES) 70 % PADS  DropSafe Alcohol Prep Pads  USE WHEN CHECKING BLOOD SUGARS     amitriptyline (ELAVIL) 10 MG tablet Take 10 mg by mouth daily.     apixaban (ELIQUIS) 5 MG TABS tablet Take 1 tablet (5 mg total) by mouth 2 (two) times daily. 60 tablet 12   Baclofen 5 MG TABS Take 5 mg by mouth 2 (two) times daily.     Calcium Carb-Cholecalciferol 600-400 MG-UNIT TABS Take 1 tablet by mouth daily.     carvedilol (COREG) 6.25 MG tablet Take 1 tablet (6.25 mg total) by mouth 2 (two) times daily with a meal. 60 tablet 0   ciprofloxacin (CIPRO) 500 MG tablet Take 500 mg by mouth 2 (two) times daily. Start date : 12/27/20  Continuous Blood Gluc Sensor (DEXCOM G6 SENSOR) MISC 1 Device by Does not apply route as directed. 9 each 3   Continuous Blood Gluc Transmit (DEXCOM G6 TRANSMITTER) MISC 1 Device by Does not apply route as directed. 1 each 3   cyanocobalamin (,VITAMIN B-12,) 1000 MCG/ML injection 1,000 mcg every 30 (thirty) days.     DROPLET PEN NEEDLES 32G X 4 MM MISC USE IN THE MORNING, AT NOON, IN THE EVENING, AND AT BEDTIME. 400 each 3   ferrous sulfate 325 (65 FE) MG EC tablet Take 325 mg by mouth daily.      fluconazole (DIFLUCAN) 150 MG tablet Take 150 mg by mouth daily.     furosemide (LASIX) 40 MG tablet TAKE 1 TABLET EVERY DAY (Patient taking differently: Take 40 mg by mouth daily.) 90 tablet 3   gabapentin (NEURONTIN) 300 MG capsule Take 1 capsule (300 mg total) by mouth at bedtime. 90 capsule 3   insulin degludec (TRESIBA FLEXTOUCH) 100 UNIT/ML FlexTouch Pen Inject 24 Units into the skin daily. 30 mL 3   isosorbide dinitrate (ISORDIL) 30 MG tablet Take 30 mg by mouth every morning.     latanoprost (XALATAN) 0.005 % ophthalmic solution Place 1 drop into both eyes at bedtime.     lisinopril (ZESTRIL) 2.5 MG tablet TAKE 1 TABLET EVERY DAY (Patient taking differently: Take 2.5 mg by mouth daily.) 90 tablet 3   meloxicam (MOBIC) 15 MG tablet Take 15 mg by mouth daily.     methocarbamol (ROBAXIN) 500 MG  tablet Take 1 tablet (500 mg total) by mouth 2 (two) times daily as needed for muscle spasms.     nitroGLYCERIN (NITROSTAT) 0.4 MG SL tablet Place 0.4 mg under the tongue every 5 (five) minutes as needed for chest pain.     NOVOLOG FLEXPEN 100 UNIT/ML FlexPen Max daily 30 units per scale (Patient taking differently: 6-12 Units daily as needed for high blood sugar. Max daily 30 units per scale. Sliding scale) 30 mL 3   potassium chloride SA (KLOR-CON) 20 MEQ tablet Take 20 mEq by mouth daily as needed (for low pottassium).     rosuvastatin (CRESTOR) 20 MG tablet Take 1 tablet (20 mg total) by mouth daily at 6 PM. 30 tablet 2   traMADol (ULTRAM) 50 MG tablet Take 50 mg by mouth daily.     Vitamin D, Ergocalciferol, (DRISDOL) 1.25 MG (50000 UT) CAPS capsule Take 1 capsule (50,000 Units total) by mouth every 7 (seven) days. 4 capsule 0   No current facility-administered medications for this visit.    REVIEW OF SYSTEMS:  [X]  denotes positive finding, [ ]  denotes negative finding Cardiac  Comments:  Chest pain or chest pressure:    Shortness of breath upon exertion:    Short of breath when lying flat:    Irregular heart rhythm:        Vascular    Pain in calf, thigh, or hip brought on by ambulation:    Pain in feet at night that wakes you up from your sleep:     Blood clot in your veins:    Leg swelling:         Pulmonary    Oxygen at home:    Productive cough:     Wheezing:         Neurologic    Sudden weakness in arms or legs:     Sudden numbness in arms or legs:     Sudden onset of difficulty speaking or slurred  speech:    Temporary loss of vision in one eye:     Problems with dizziness:         Gastrointestinal    Blood in stool:     Vomited blood:         Genitourinary    Burning when urinating:     Blood in urine:        Psychiatric    Major depression:         Hematologic    Bleeding problems:    Problems with blood clotting too easily:        Skin    Rashes or  ulcers:        Constitutional    Fever or chills:    -  PHYSICAL EXAM:   Vitals:   02/16/21 1305  BP: (!) 169/75  Pulse: 72  Resp: 20  Temp: 97.9 F (36.6 C)  SpO2: 96%  Weight: 228 lb (103.4 kg)  Height: 5\' 2"  (1.575 m)    Body mass index is 41.7 kg/m.  GENERAL: The patient is a well-nourished female, in no acute distress. The vital signs are documented above. CARDIAC: There is a regular rate and rhythm.  VASCULAR: I do not detect carotid bruits. I cannot palpate pedal pulses.  She does have a biphasic posterior tibial signal bilaterally. She does admit significant swelling bilaterally more significant on the left side.  She has hyperpigmentation bilaterally.  This too is more significant on the left side.  She has no ulcerations. PULMONARY: There is good air exchange bilaterally without wheezing or rales. ABDOMEN: Soft and non-tender with normal pitched bowel sounds.  MUSCULOSKELETAL: There are no major deformities. NEUROLOGIC: No focal weakness or paresthesias are detected. SKIN: There are no ulcers or rashes noted. PSYCHIATRIC: The patient has a normal affect.  DATA:    ARTERIAL DOPPLER STUDY: I have independently interpreted the patient's arterial Doppler study on 02/01/2021.  On the right side there was a biphasic dorsalis pedis and posterior tibial signal.  ABI was 100%.  Toe pressure was 114 mmHg.  On the left side, there was a biphasic posterior tibial signal with a monophasic dorsalis pedis signal.  ABI was 100%.  Toe pressure was 76 mmHg.  [name] [facility]

## 2021-02-17 ENCOUNTER — Other Ambulatory Visit: Payer: Self-pay

## 2021-02-17 NOTE — Patient Outreach (Signed)
Aging Gracefully Program  02/17/2021  [name] Dec 23, 1950 [id number]   [facility] Evaluation Interviewer made contact with patient. Aging Gracefully second survey completed.   Interviewer will send referral to [facility] for follow up.   [place] Management Assistant  [phone]

## 2021-02-22 ENCOUNTER — Other Ambulatory Visit: Payer: Self-pay | Admitting: Cardiology

## 2021-03-13 ENCOUNTER — Ambulatory Visit (INDEPENDENT_AMBULATORY_CARE_PROVIDER_SITE_OTHER): Payer: Medicare HMO

## 2021-03-13 DIAGNOSIS — I5042 Chronic combined systolic (congestive) and diastolic (congestive) heart failure: Secondary | ICD-10-CM

## 2021-03-13 DIAGNOSIS — Z95810 Presence of automatic (implantable) cardiac defibrillator: Secondary | ICD-10-CM

## 2021-03-17 ENCOUNTER — Telehealth: Payer: Self-pay | Admitting: *Deleted

## 2021-03-17 NOTE — Telephone Encounter (Signed)
Patient called stating the "swelling" in bilateral lower extremities has worsened.  Patient stated that she has not been elevating "much lately".  I explained this will assist in reducing the amount of swelling along with walking and continuing to work on weight loss.  Patient has agreed to elevate her legs more often.  Also Jeani Hawking has given [employer] patient's information in hopes of securing a compression therapy pump. ?

## 2021-03-17 NOTE — Progress Notes (Signed)
EPIC Encounter for ICM Monitoring ? ?Patient Name: [name] is a 72 y.o. female ?Date: 03/17/2021 ?Primary Care Physican: Everardo Beals, NP ?Primary Cardiologist: Skains/Weaver PA ?Electrophysiologist: Allred ?02/08/2021 Weight: 226 lbs ?  ?AT/AF Burden: <1% (taking Eliquis) ?                                                         ?Spoke with patient and heart failure questions reviewed.  Pt asymptomatic for fluid accumulation.  Reports feeling well at this time and voices no complaints.  ?   ?CorVue thoracic impedance suggesting normal fluid levels. ?  ?Prescribed: ?Furosemide 40 mg take 1 tablet daily ?Potassium 20 mEq take 1 tablet daily ?  ?Labs: ?01/05/2021 Creatinine 0.75, BUN 12, Potassium 4.0, Sodium 140, GFR >60 ?11/30/2020 Creatinine 1.29, BUN 21, Potassium 3.6, Sodium 135, GFR 45 ?A complete set of results can be found in Results Review. ?  ?Recommendations:  No changes and encouraged to call if experiencing any fluid symptoms. ?  ?Next ICM clinic phone appointment due: 04/17/2021.     Next 91 day remote transmission due: 04/24/2021.   ?  ?EP/Cardiology Office Visits:  Recall 01/26/2022 with Dr Quentin Ore. ?  ?Copy of ICM check sent to Dr. Rayann Heman.    ? ?3 month ICM trend: 03/16/2021. ? ? ? ?12-14 Month ICM trend:  ? ? ? ?[name], RN ?03/17/2021 ?2:35 PM ? ?

## 2021-04-17 ENCOUNTER — Ambulatory Visit (INDEPENDENT_AMBULATORY_CARE_PROVIDER_SITE_OTHER): Payer: Medicare HMO

## 2021-04-17 ENCOUNTER — Telehealth: Payer: Self-pay

## 2021-04-17 DIAGNOSIS — Z95810 Presence of automatic (implantable) cardiac defibrillator: Secondary | ICD-10-CM | POA: Diagnosis not present

## 2021-04-17 DIAGNOSIS — I5042 Chronic combined systolic (congestive) and diastolic (congestive) heart failure: Secondary | ICD-10-CM

## 2021-04-17 NOTE — Telephone Encounter (Signed)
Spoke with patient and requested to send manual transmission to check monthly fluid levels and she will send it today.  ? ?

## 2021-04-18 ENCOUNTER — Ambulatory Visit (INDEPENDENT_AMBULATORY_CARE_PROVIDER_SITE_OTHER): Payer: Medicare HMO | Admitting: Podiatry

## 2021-04-18 ENCOUNTER — Encounter: Payer: Self-pay | Admitting: Podiatry

## 2021-04-18 DIAGNOSIS — E1151 Type 2 diabetes mellitus with diabetic peripheral angiopathy without gangrene: Secondary | ICD-10-CM | POA: Diagnosis not present

## 2021-04-18 DIAGNOSIS — B351 Tinea unguium: Secondary | ICD-10-CM

## 2021-04-18 DIAGNOSIS — M79675 Pain in left toe(s): Secondary | ICD-10-CM

## 2021-04-18 DIAGNOSIS — M79674 Pain in right toe(s): Secondary | ICD-10-CM | POA: Diagnosis not present

## 2021-04-18 NOTE — Progress Notes (Signed)
EPIC Encounter for ICM Monitoring ? ?Patient Name: [name] is a 71 y.o. female ?Date: 04/18/2021 ?Primary Care Physican: Everardo Beals, NP ?Primary Cardiologist: Skains/Weaver PA ?Electrophysiologist: Allred ?04/18/2021 Weight: 226 lbs ?  ?AT/AF Burden: <1% (taking Eliquis) ?                                                         ?Spoke with patient and heart failure questions reviewed.  Pt asymptomatic for fluid accumulation.  Reports feeling well at this time and voices no complaints.  ?   ?CorVue thoracic impedance suggesting normal fluid levels. ?  ?Prescribed: ?Furosemide 40 mg take 1 tablet daily ?Potassium 20 mEq take 1 tablet daily ?  ?Labs: ?01/05/2021 Creatinine 0.75, BUN 12, Potassium 4.0, Sodium 140, GFR >60 ?11/30/2020 Creatinine 1.29, BUN 21, Potassium 3.6, Sodium 135, GFR 45 ?A complete set of results can be found in Results Review. ?  ?Recommendations:  No changes and encouraged to call if experiencing any fluid symptoms. ?  ?Next ICM clinic phone appointment due: 05/22/2021.     Next 91 day remote transmission due: 04/24/2021.   ?  ?EP/Cardiology Office Visits:  Recall 01/26/2022 with Dr Quentin Ore. ?  ?Copy of ICM check sent to Dr. Rayann Heman.    ?  ?3 month ICM trend: 04/17/2021. ? ? ? ?12-14 Month ICM trend:  ? ? ? ?[name], RN ?04/18/2021 ?3:55 PM ? ?

## 2021-04-21 ENCOUNTER — Other Ambulatory Visit: Payer: Self-pay | Admitting: Vascular Surgery

## 2021-04-21 NOTE — Telephone Encounter (Signed)
The patient has been on chronic anticoagulant therapy because of her history of DVTs bilaterally ?

## 2021-04-23 NOTE — Progress Notes (Signed)
?  Subjective:  ?Patient ID: [name], female    DOB: 1950/06/14,  MRN: [id number] ? ?[name] presents to clinic today for at risk foot care. Pt has h/o NIDDM with PAD and painful elongated mycotic toenails 1-5 bilaterally which are tender when wearing enclosed shoe gear. Pain is relieved with periodic professional debridement. ? ?Patient states blood glucose was 300 mg/dl today.  Last HgA1c was 6.2%. ? ?New problem(s): None.  ? ?She has seen Dr. [name] of [facility] for both chronic venous insufficiency and PAD. Also has h/o DVT and on Eliquis. Dr. Scot Dock recommended compression hose, exercise and daily leg elevation. For PAD, he recommended she stay active. No further work up needed unless she develops a nonhealing ulcer on her feet. ? ?PCP is Everardo Beals, NP , and last visit was January 16, 2021. ? ?Allergies  ?Allergen Reactions  ? Lipitor  [Atorvastatin Calcium] Rash  ? Metformin And Related Itching, Swelling and Other (See Comments)  ?  Leg pain & swelling in legs  ? Ozempic (0.25 Or 0.5 Mg-Dose) [Semaglutide(0.25 Or 0.'5mg'$ -Dos)] Other (See Comments)  ?  Pt with hx of pancreatitis   ? Atorvastatin Itching and Rash  ? Levofloxacin Itching  ? ? ?Review of Systems: Negative except as noted in the HPI. ? ?Objective: No changes noted in today's physical examination. ? ?Vascular Examination: ?CFT <4 seconds b/l LE. Nonpalpable DP/PT pulses b/l. Pedal hair absent b/l. Skin temperature gradient warm to cool b/l. No pain with calf compression b/l. No cyanosis or clubbing noted b/l LE. Trace edema noted BLE. ? ?Neurological Examination: ?Protective sensation decreased with 10 gram monofilament b/l. ? ?Dermatological Examination: ?Pedal skin thin and atrophic b/l LE. No open wounds b/l LE. No interdigital macerations noted b/l LE. Toenails 1-5 b/l elongated, discolored, dystrophic, thickened, crumbly with subungual debris and tenderness to dorsal  palpation.Incurvated nailplate lateral border left hallux.  Nail border hypertrophy absent. There is tenderness to palpation. Sign(s) of infection: no clinical signs of infection noted on examination today..  ? ?Musculoskeletal Examination: ?Muscle strength 5/5 to b/l LE. No pain, crepitus or joint limitation noted with ROM bilateral LE. Pes planus deformity noted bilateral LE. ? ?Radiographs: None ? ?  Latest Ref Rng & Units 12/23/2020  ?  9:52 AM 09/02/2020  ?  8:32 AM 05/26/2020  ? 10:49 AM  ?Hemoglobin A1C  ?Hemoglobin-A1c 4.0 - 5.6 % 6.3   6.8   7.0    ? ?Assessment/Plan: ?1. Pain due to onychomycosis of toenails of both feet   ?2. Type II diabetes mellitus with peripheral circulatory disorder (HCC)   ?  ?-Patient was evaluated and treated. All patient's and/or POA's questions/concerns answered on today's visit. ?-We reviewed her ABIs and she is followed by [facility] for CVI and PAD. ?-Continue diabetic foot care principles: inspect feet daily, monitor glucose as recommended by PCP and/or Endocrinologist, and follow prescribed diet per PCP, Endocrinologist and/or dietician. ?-Toenails 1-5 b/l were debrided in length and girth with sterile nail nippers and dremel without iatrogenic bleeding.  ?-Patient/POA to call should there be question/concern in the interim.  ? ?Return in about 3 months (around 07/18/2021). ? ?Marzetta Board, DPM  ?

## 2021-04-24 ENCOUNTER — Ambulatory Visit (INDEPENDENT_AMBULATORY_CARE_PROVIDER_SITE_OTHER): Payer: Medicare HMO

## 2021-04-24 DIAGNOSIS — I5042 Chronic combined systolic (congestive) and diastolic (congestive) heart failure: Secondary | ICD-10-CM

## 2021-04-26 LAB — CUP PACEART REMOTE DEVICE CHECK
Battery Remaining Longevity: 23 mo
Battery Remaining Percentage: 22 %
Battery Voltage: 2.78 V
Brady Statistic AP VP Percent: 1 %
Brady Statistic AP VS Percent: 1 %
Brady Statistic AS VP Percent: 1 %
Brady Statistic AS VS Percent: 99 %
Brady Statistic RA Percent Paced: 1 %
Brady Statistic RV Percent Paced: 1 %
Date Time Interrogation Session: 20230417020031
HighPow Impedance: 37 Ohm
HighPow Impedance: 38 Ohm
Implantable Lead Implant Date: 20080423
Implantable Lead Implant Date: 20080423
Implantable Lead Implant Date: 20140918
Implantable Lead Location: 753858
Implantable Lead Location: 753859
Implantable Lead Location: 753860
Implantable Lead Model: 5076
Implantable Lead Model: 6947
Implantable Pulse Generator Implant Date: 20140918
Lead Channel Impedance Value: 390 Ohm
Lead Channel Impedance Value: 450 Ohm
Lead Channel Pacing Threshold Amplitude: 0.5 V
Lead Channel Pacing Threshold Amplitude: 0.75 V
Lead Channel Pacing Threshold Pulse Width: 0.5 ms
Lead Channel Pacing Threshold Pulse Width: 0.5 ms
Lead Channel Sensing Intrinsic Amplitude: 12 mV
Lead Channel Sensing Intrinsic Amplitude: 2.1 mV
Lead Channel Setting Pacing Amplitude: 2 V
Lead Channel Setting Pacing Amplitude: 2.5 V
Lead Channel Setting Pacing Pulse Width: 0.5 ms
Lead Channel Setting Sensing Sensitivity: 0.5 mV
Pulse Gen Serial Number: 7070007

## 2021-04-28 ENCOUNTER — Encounter: Payer: Self-pay | Admitting: Internal Medicine

## 2021-04-28 ENCOUNTER — Ambulatory Visit (INDEPENDENT_AMBULATORY_CARE_PROVIDER_SITE_OTHER): Payer: Medicare HMO | Admitting: Internal Medicine

## 2021-04-28 VITALS — BP 120/74 | HR 65 | Ht 62.0 in | Wt 245.0 lb

## 2021-04-28 DIAGNOSIS — E1165 Type 2 diabetes mellitus with hyperglycemia: Secondary | ICD-10-CM

## 2021-04-28 DIAGNOSIS — E1159 Type 2 diabetes mellitus with other circulatory complications: Secondary | ICD-10-CM | POA: Diagnosis not present

## 2021-04-28 DIAGNOSIS — Z794 Long term (current) use of insulin: Secondary | ICD-10-CM

## 2021-04-28 DIAGNOSIS — R739 Hyperglycemia, unspecified: Secondary | ICD-10-CM

## 2021-04-28 DIAGNOSIS — E1142 Type 2 diabetes mellitus with diabetic polyneuropathy: Secondary | ICD-10-CM | POA: Diagnosis not present

## 2021-04-28 LAB — LIPID PANEL
Cholesterol: 125 mg/dL (ref 0–200)
HDL: 44.2 mg/dL (ref >39.00)
LDL Cholesterol: 65 mg/dL (ref 0–99)
NonHDL: 80.7
Total CHOL/HDL Ratio: 3
Triglycerides: 78 mg/dL (ref 0.0–149.0)
VLDL: 15.6 mg/dL (ref 0.0–40.0)

## 2021-04-28 LAB — POCT GLYCOSYLATED HEMOGLOBIN (HGB A1C): Hemoglobin A1C: 8.2 % — AB (ref 4.0–5.6)

## 2021-04-28 LAB — BASIC METABOLIC PANEL
BUN: 27 mg/dL — ABNORMAL HIGH (ref 6–23)
CO2: 27 mEq/L (ref 19–32)
Calcium: 9.5 mg/dL (ref 8.4–10.5)
Chloride: 103 mEq/L (ref 96–112)
Creatinine, Ser: 1.05 mg/dL (ref 0.40–1.20)
GFR: 53.59 mL/min — ABNORMAL LOW (ref >60.00)
Glucose, Bld: 136 mg/dL — ABNORMAL HIGH (ref 70–99)
Potassium: 4 mEq/L (ref 3.5–5.1)
Sodium: 140 mEq/L (ref 135–145)

## 2021-04-28 LAB — MICROALBUMIN / CREATININE URINE RATIO
Creatinine,U: 104.2 mg/dL
Microalb Creat Ratio: 1.7 mg/g (ref 0.0–30.0)
Microalb, Ur: 1.8 mg/dL (ref 0.0–1.9)

## 2021-04-28 NOTE — Patient Instructions (Addendum)
-   Increase Tresiba 28 units ONCE  Daily ?- Restart Farxiga 10 mg, 1 tablet with Breakfast ( Hold it 2 days before the surgery , resume after surgery)  ?-Novolog correctional insulin: Use the scale below to help guide you BEFORE you eat  ? ?Blood sugar before meal Number of units to inject  ?Less than 170 0 unit  ?171 -  210 2 units  ?211-  250 3 units  ?251 -  290 4 units  ?291 -  330 5 units  ?331 -  370 6 units  ? ? ? ?HOW TO TREAT LOW BLOOD SUGARS (Blood sugar LESS THAN 70 MG/DL) ?Please follow the RULE OF 15 for the treatment of hypoglycemia treatment (when your (blood sugars are less than 70 mg/dL)  ? ?STEP 1: Take 15 grams of carbohydrates when your blood sugar is low, which includes:  ?3-4 GLUCOSE TABS  OR ?3-4 OZ OF JUICE OR REGULAR SODA OR ?ONE TUBE OF GLUCOSE GEL   ? ?STEP 2: RECHECK blood sugar in 15 MINUTES ?STEP 3: If your blood sugar is still low at the 15 minute recheck --> then, go back to STEP 1 and treat AGAIN with another 15 grams of carbohydrates. ? ?

## 2021-04-28 NOTE — Progress Notes (Signed)
?Name: [name]  ?Age/ Sex: 71 y.o., female   ?MRN/ DOB: [id number], [birth year]    ? ?PCP: Everardo Beals, NP   ?Reason for Endocrinology Evaluation: Type 2 Diabetes Mellitus  ?Initial Endocrine Consultative Visit: 04/20/2019  ? ? ?PATIENT IDENTIFIER: [name] is a 71 y.o. female with a past medical history of DM, HTN, Dyslipidemia , CAD and hx of pancreatitis.. The patient has followed with Endocrinology clinic since 04/20/2019 for consultative assistance with management of her diabetes. ? ? ? ? ?DIABETIC HISTORY:  ?[name] was diagnosed with T2DM many years ago, she is intolerant to Metformin, has been on Glipizide without reported intolerance. Her hemoglobin A1c has ranged from 7.8% in 2017, peaking at 9.5% in 2014. ? ?On her initial visit to our clinic she had an A1c of 7.7%  , she was on MDI regimen and Ozempic. Marland Kitchen We switched levemir to tresiba and continued Ozempic and Novolog.  ? ? ?farxiga started 11/2019 ?Ozempic stopped 12/2020 due to pancreatitis on CT imaging.  ? ? ? ?SUBJECTIVE:  ? ?During the last visit (12/23/2020): A1c 6.3 %, We adjusted MDI regimen  and continued  [place] and stopped Ozempic  ? ? ?Today (04/28/2021): [name] is here for a follow up on diabetes.  She checks her blood sugar through the CGM The patient has had  hypoglycemic episodes since the last clinic visit, which typically occur 1 x /month  - most often occuring during the day. The patient is  symptomatic with these episodes. ? ? ?She was evaluated by podiatry on 04/18/2021 ?She was seen by vascular surgery on 02/16/2021-normal ABI ?She was also seen by cardiology in February 2023 for CHF and CAD ? ?Patient was evaluated by GYN by Dr. Teresa Coombs for ovarian cyst removal and was cleared by cardiology , scheduled may 30th  ? ?She denies nausea, vomiting or diarrhea  ? ? ? ?HOME DIABETES REGIMEN:  ?Tresiba  24 units ONCE  Daily  ?Farxiga 10 mg daily  ?CF: Novolog (BG-130/40)  ? ? ? ?Statin: Yes ?ACE-I/ARB:  No  ? ? ? ? ?CONTINUOUS GLUCOSE MONITORING RECORD INTERPRETATION   ? ?Dates of Recording: 4/8-4/21/2023 ? ?Sensor description:dexcom ? ?Results statistics: ?  ?CGM use % of time 93  ?Average and SD 190/69  ?Time in range  41 %  ?% Time Above 180 40  ?% Time above 250 16  ?% Time Below target <1  ? ? ? ? ?Glycemic patterns summary: Hyperglycemia during the day and night  ? ?Hyperglycemic episodes  post prandial  ? ?Hypoglycemic episodes occurred post bolus  ? ?Overnight periods: optimal  ? ? ? ? ? ? ?DIABETIC COMPLICATIONS: ?Microvascular complications:  ?Neuropathy ?Denies: CKD, retinopathy  ?Last Eye Exam: Completed 2019 ? ?Macrovascular complications:  ?CAD, CHF ?Denies: CVA, PVD ? ? ?HISTORY:  ?Past Medical History:  ?Past Medical History:  ?Diagnosis Date  ? Abnormal LFTs   ? AICD (automatic cardioverter/defibrillator) present   ? Angina decubitus ([place]) 11/06/2012  ? Angina pectoris ([place])   ? Anxiety   ? Arthritis   ? Back pain   ? Bundle branch block 05/2016  ? CAD (coronary artery disease) 06/18/2010  ? CHF (congestive heart failure) ([place])   ? Chronic combined systolic and diastolic CHF, NYHA class 3 ([place])   ? Chronic combined systolic and diastolic heart failure ([place])   ? Chronic systolic dysfunction of left ventricle 09/24/2012  ? Coronary artery disease   ? s/p CABG 2007  ?  Depression   ? Dermal mycosis   ? Diabetes mellitus   ? type II  ? DKA (diabetic ketoacidoses) 01/12/2019  ? DVT (deep venous thrombosis) ([place]) 09/2015  ? bilateral  ? Edema, lower extremity   ? Glaucoma   ? Hyperlipidemia   ? Hypertension   ? Hypoglycemia 09/09/2012  ? Insomnia   ? Insulin dependent diabetes mellitus ([place]) 04/23/2012  ? Ischemic cardiomyopathy   ? EF 30-35%, s/p ICD 4/08 by Dr Leonia Reeves  ? Joint pain   ? LBBB (left bundle branch block) 09/24/2012  ? Morbid obesity ([place]) 11/06/2012  ? Obesity   ? Oxygen dependent 2 L  ? Pain in left knee   ? Peripheral neuropathy   ? 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  ? LIMA to D2, SVG to ramus intermediate,  sequential SVG to OM1-OM2, SVG to RCA with EVH via both legs   ? Sleep apnea   ? uses O2 at night  ? Tinea   ? Type 2 diabetes mellitus with hyperglycemia, with long-term current use of insulin ([place]) 04/20/2019  ? Uncontrolled diabetes mellitus 02/2019  ? Ventricular tachycardia ([place]) 01/16/15  ? sustained VT terminated with ATP, CL 340 msec  ? Vitamin B 12 deficiency 11/05/2018  ? Vitamin D deficiency 11/05/2018  ? ?Past Surgical History:  ?Past Surgical History:  ?Procedure Laterality Date  ? BI-VENTRICULAR IMPLANTABLE CARDIOVERTER DEFIBRILLATOR UPGRADE N/A 09/25/2012  ? Procedure: BI-VENTRICULAR IMPLANTABLE CARDIOVERTER DEFIBRILLATOR UPGRADE;  Surgeon: Coralyn Mark, MD;  Location: [facility] CATH LAB;  Service: Cardiovascular;  Laterality: N/A;  ? BREAST BIOPSY Right 04/05/2014  ? CARDIAC CATHETERIZATION    ? CARDIAC DEFIBRILLATOR PLACEMENT  4/08  ? by Dr Leonia Reeves (MDT)  ? COLONOSCOPY WITH PROPOFOL N/A 10/12/2016  ? Procedure: COLONOSCOPY WITH PROPOFOL;  Surgeon: Doran Stabler, MD;  Location: [facility];  Service: Gastroenterology;  Laterality: N/A;  ? CORONARY ARTERY BYPASS GRAFT  10/05/2005  ? by Dr Cyndia Bent  ? IMPLANTABLE CARDIOVERTER DEFIBRILLATOR IMPLANT  09/25/12  ? attempt of upgrade to CRT-D unsuccessful due to CS anatomy, SJM Unify [place] device placed with LV port capped by Dr Rayann Heman  ? IR GENERIC HISTORICAL  10/03/2015  ? IR US GUIDE VASC ACCESS LEFT 10/03/2015 Jacqulynn Cadet, MD [facility]  ? IR GENERIC HISTORICAL  10/03/2015  ? IR VENO/EXT/UNI LEFT 10/03/2015 Jacqulynn Cadet, MD [facility]  ? IR GENERIC HISTORICAL  10/03/2015  ? IR VENOCAVAGRAM IVC 10/03/2015 Jacqulynn Cadet, MD [facility]  ? IR GENERIC HISTORICAL  10/03/2015  ? IR INFUSION THROMBOL VENOUS INITIAL (MS) 10/03/2015 Jacqulynn Cadet, MD [facility]  ? IR GENERIC HISTORICAL  10/03/2015  ? IR US GUIDE VASC ACCESS LEFT 10/03/2015 Jacqulynn Cadet, MD [facility]  ? IR GENERIC HISTORICAL  10/04/2015  ? IR THROMBECT VENO [facility] MOD SED 10/04/2015  Sandi Mariscal, MD [facility]  ? IR GENERIC HISTORICAL  10/04/2015  ? IR TRANSCATH PLC STENT 1ST ART NOT LE CV CAR VERT CAR 10/04/2015 Sandi Mariscal, MD [facility]  ? IR GENERIC HISTORICAL  10/04/2015  ? IR THROMB F/U EVAL ART/VEN FINAL DAY (MS) 10/04/2015 Sandi Mariscal, MD [facility]  ? IR GENERIC HISTORICAL  11/02/2015  ? IR RADIOLOGIST EVAL & MGMT 11/02/2015 Sandi Mariscal, MD [facility]  ? IR RADIOLOGIST EVAL & MGMT  04/26/2016  ? LEFT HEART CATH AND CORS/GRAFTS ANGIOGRAPHY N/A 01/19/2019  ? Procedure: LEFT HEART CATH AND CORS/GRAFTS ANGIOGRAPHY;  Surgeon: Belva Crome, MD;  Location: [facility] LAB;  Service:  Cardiovascular;  Laterality: N/A;  ? ?Social History:  reports that she has never smoked. She has never used smokeless tobacco. She reports that she does not drink alcohol and does not use drugs. ?Family History:  ?Family History  ?Problem Relation Age of Onset  ? Diabetes Mother 30  ?     died - HTN  ? Stroke Mother   ? High blood pressure Mother   ? Sudden death Mother   ? Obesity Mother   ? Other Other   ?     No early family hx of CAD  ? ? ? ?HOME MEDICATIONS: ?Allergies as of 04/28/2021   ? ?   Reactions  ? Lipitor  [atorvastatin Calcium] Rash  ? Metformin And Related Itching, Swelling, Other (See Comments)  ? Leg pain & swelling in legs  ? Ozempic (0.25 Or 0.5 Mg-dose) [semaglutide(0.25 Or 0.'5mg'$ -dos)] Other (See Comments)  ? Pt with hx of pancreatitis   ? Atorvastatin Itching, Rash  ? Levofloxacin Itching  ? ?  ? ?  ?Medication List  ?  ? ?  ? Accurate as of April 28, 2021  9:50 AM. If you have any questions, ask your nurse or doctor.  ?  ?  ? ?  ? ?albuterol (2.5 MG/3ML) 0.083% nebulizer solution ?Commonly known as: PROVENTIL ?Take 2.5 mg by nebulization every 6 (six) hours as needed for wheezing or shortness of breath. ?  ?Alcohol Wipes 70 % Pads ?DropSafe Alcohol Prep Pads ? USE WHEN CHECKING BLOOD SUGARS ?  ?amitriptyline 10 MG tablet ?Commonly known as: ELAVIL ?Take 10 mg by mouth daily. ?   ?apixaban 5 MG Tabs tablet ?Commonly known as: Eliquis ?Take 1 tablet (5 mg total) by mouth 2 (two) times daily. ?  ?Baclofen 5 MG Tabs ?Take 5 mg by mouth 2 (two) times daily. ?  ?Calcium Carb-Choleca

## 2021-05-11 NOTE — Progress Notes (Signed)
Remote ICD transmission.   

## 2021-05-15 ENCOUNTER — Ambulatory Visit: Payer: Medicare HMO

## 2021-05-15 DIAGNOSIS — M2141 Flat foot [pes planus] (acquired), right foot: Secondary | ICD-10-CM

## 2021-05-15 DIAGNOSIS — E1151 Type 2 diabetes mellitus with diabetic peripheral angiopathy without gangrene: Secondary | ICD-10-CM

## 2021-05-15 NOTE — Progress Notes (Signed)
SITUATION ?Reason for Consult: Evaluation for Prefabricated Diabetic Shoes and Custom Diabetic Inserts. ?Patient / Caregiver Report: Patient would like well fitting shoes ? ?OBJECTIVE DATA: ?Patient History / Diagnosis:  ?  ICD-10-CM   ?1. Type II diabetes mellitus with peripheral circulatory disorder (HCC)  E11.51   ?  ?2. Pes planus of both feet  M21.41   ? M21.42   ?  ? ?Physician Treating Diabetes:  Shamleffer ? ?Current or Previous Devices:   Historical user ? ?In-Person Foot Examination: ?Ulcers & Callousing:   None ?Deformities:    Pes planus ?Sensation:    Compromised  ?Shoe Size:     9XW ? ?ORTHOTIC RECOMMENDATION ?Recommended Devices: ?- 1x pair prefabricated PDAC approved diabetic shoes; Patient Selected [employer] Black 849 ? Size 9XW ?- 3x pair custom-to-patient PDAC approved vacuum formed diabetic insoles. ? ?GOALS OF SHOES AND INSOLES ?- Reduce shear and pressure ?- Reduce / Prevent callus formation ?- Reduce / Prevent ulceration ?- Protect the fragile healing compromised diabetic foot. ? ?Patient would benefit from diabetic shoes and inserts as patient has diabetes mellitus and the patient has one or more of the following conditions: ?- History of partial or complete amputation of the foot ?- History of previous foot ulceration. ?- History of pre-ulcerative callus ?- Peripheral neuropathy with evidence of callus formation ?- Foot deformity ?- Poor circulation ? ?ACTIONS PERFORMED ?Potential out of pocket cost was communicated to patient. Patient understood and consented to measurement and casting. Patient was casted for insoles via crush box and measured for shoes via brannock device. Procedure was explained and patient tolerated procedure well. All questions were answered and concerns addressed. Casts were shipped to central fabrication for HOLD until Certificate of Medical Necessity or otherwise necessary authorization from insurance is obtained. ? ?PLAN ?Shoes are to be ordered and casts  released from hold once all appropriate paperwork is complete. Patient is to be contacted and scheduled for fitting once shoes and insoles have been fabricated and received. ? ?

## 2021-05-16 ENCOUNTER — Other Ambulatory Visit (INDEPENDENT_AMBULATORY_CARE_PROVIDER_SITE_OTHER): Payer: Medicare HMO | Admitting: Podiatry

## 2021-05-16 DIAGNOSIS — E1151 Type 2 diabetes mellitus with diabetic peripheral angiopathy without gangrene: Secondary | ICD-10-CM

## 2021-05-16 DIAGNOSIS — M2142 Flat foot [pes planus] (acquired), left foot: Secondary | ICD-10-CM

## 2021-05-16 DIAGNOSIS — M2141 Flat foot [pes planus] (acquired), right foot: Secondary | ICD-10-CM

## 2021-05-16 NOTE — Progress Notes (Signed)
Order entered for medically necessary diabetic shoes. ?

## 2021-05-22 ENCOUNTER — Ambulatory Visit (INDEPENDENT_AMBULATORY_CARE_PROVIDER_SITE_OTHER): Payer: Medicare HMO

## 2021-05-22 DIAGNOSIS — Z95810 Presence of automatic (implantable) cardiac defibrillator: Secondary | ICD-10-CM

## 2021-05-22 DIAGNOSIS — I5042 Chronic combined systolic (congestive) and diastolic (congestive) heart failure: Secondary | ICD-10-CM | POA: Diagnosis not present

## 2021-05-22 NOTE — Progress Notes (Signed)
EPIC Encounter for ICM Monitoring ? ?Patient Name: [name] is a 71 y.o. female ?Date: 05/22/2021 ?Primary Care Physican: Everardo Beals, NP ?Primary Cardiologist: Skains/Weaver PA ?Electrophysiologist: Allred ?04/18/2021 Weight: 226 lbs ?05/22/2021 Office Weight: 245 lbs ?  ?AT/AF Burden: <1% (taking Eliquis) ?                                                         ?Spoke with patient and heart failure questions reviewed.  Pt asymptomatic for fluid accumulation.  Reports feeling well at this time and voices no complaints.  ?   ?CorVue thoracic impedance suggesting normal fluid levels. ?  ?Prescribed: ?Furosemide 40 mg take 1 tablet daily ?Potassium 20 mEq take 1 tablet daily ?  ?Labs: ?01/05/2021 Creatinine 0.75, BUN 12, Potassium 4.0, Sodium 140, GFR >60 ?11/30/2020 Creatinine 1.29, BUN 21, Potassium 3.6, Sodium 135, GFR 45 ?A complete set of results can be found in Results Review. ?  ?Recommendations:  No changes and encouraged to call if experiencing any fluid symptoms. ?  ?Next ICM clinic phone appointment due: 06/26/2021.     Next 91 day remote transmission due: 07/24/2021.   ?  ?EP/Cardiology Office Visits:  Recall 01/26/2022 with Dr Quentin Ore. ?  ?Copy of ICM check sent to Dr. Rayann Heman.  ? ?3 month ICM trend: 05/22/2021. ? ? ? ?12-14 Month ICM trend:  ? ? ? ?[name], RN ?05/22/2021 ?4:34 PM ? ?

## 2021-05-29 ENCOUNTER — Telehealth: Payer: Self-pay

## 2021-05-29 NOTE — Telephone Encounter (Signed)
CMN Received - Casts released from fabrication hold, shoes ordered  [employer] [place]

## 2021-06-19 ENCOUNTER — Other Ambulatory Visit: Payer: Self-pay

## 2021-06-19 NOTE — Patient Outreach (Signed)
Aging Gracefully Program  06/19/2021  [name] 08/22/1950 [id number]   [facility] Evaluation Interviewer made contact with patient. Aging Gracefully survey completed.   Interviewer will send referral to RN and OT for follow up.   [place] Management Assistant [phone]

## 2021-06-20 ENCOUNTER — Ambulatory Visit (INDEPENDENT_AMBULATORY_CARE_PROVIDER_SITE_OTHER): Payer: Medicare HMO

## 2021-06-20 DIAGNOSIS — E1151 Type 2 diabetes mellitus with diabetic peripheral angiopathy without gangrene: Secondary | ICD-10-CM | POA: Diagnosis not present

## 2021-06-20 DIAGNOSIS — M2141 Flat foot [pes planus] (acquired), right foot: Secondary | ICD-10-CM

## 2021-06-20 DIAGNOSIS — M2142 Flat foot [pes planus] (acquired), left foot: Secondary | ICD-10-CM

## 2021-06-20 NOTE — Progress Notes (Signed)
SITUATION Reason for Visit: Fitting of Diabetic Shoes & Insoles Patient / Caregiver Report:  Patient is satisfied with fit and function of shoes and insoles.  OBJECTIVE DATA: Patient History / Diagnosis:     ICD-10-CM   1. Type II diabetes mellitus with peripheral circulatory disorder (HCC)  E11.51     2. Pes planus of both feet  M21.41    M21.42       Change in Status:   None  ACTIONS PERFORMED: In-Person Delivery, patient was fit with: - 1x pair [id number] PDAC approved prefabricated Diabetic Shoes: [employer] Francis 849 black 9XW - 3x pair [phone] PDAC approved vacuum formed custom diabetic insoles; [employer]: [id number]  Shoes and insoles were verified for structural integrity and safety. Patient wore shoes and insoles in office. Skin was inspected and free of areas of concern after wearing shoes and inserts. Shoes and inserts fit properly. Patient / Caregiver provided with ferbal instruction and demonstration regarding donning, doffing, wear, care, proper fit, function, purpose, cleaning, and use of shoes and insoles ' and in all related precautions and risks and benefits regarding shoes and insoles. Patient / Caregiver was instructed to wear properly fitting socks with shoes at all times. Patient was also provided with verbal instruction regarding how to report any failures or malfunctions of shoes or inserts, and necessary follow up care. Patient / Caregiver was also instructed to contact physician regarding change in status that may affect function of shoes and inserts.   Patient / Caregiver verbalized undersatnding of instruction provided. Patient / Caregiver demonstrated independence with proper donning and doffing of shoes and inserts.  PLAN Patient to follow with treating physician as recommended. Plan of care was discussed with and agreed upon by patient and/or caregiver. All questions were answered and concerns addressed.

## 2021-06-23 ENCOUNTER — Ambulatory Visit (INDEPENDENT_AMBULATORY_CARE_PROVIDER_SITE_OTHER): Payer: Medicare HMO

## 2021-06-23 ENCOUNTER — Telehealth: Payer: Self-pay

## 2021-06-23 DIAGNOSIS — I5042 Chronic combined systolic (congestive) and diastolic (congestive) heart failure: Secondary | ICD-10-CM | POA: Diagnosis not present

## 2021-06-23 DIAGNOSIS — Z95810 Presence of automatic (implantable) cardiac defibrillator: Secondary | ICD-10-CM

## 2021-06-23 NOTE — Progress Notes (Signed)
Spoke with patient and heart failure questions reviewed.  Pt asymptomatic for fluid accumulation.  Reports feeling well at this time and voices no complaints. No changes and encouraged to call if experiencing any fluid symptoms. 

## 2021-06-23 NOTE — Progress Notes (Signed)
EPIC Encounter for ICM Monitoring  Patient Name: [name] is a 71 y.o. female Date: 06/23/2021 Primary Care Physican: Everardo Beals, NP Primary Cardiologist: Skains/Weaver PA Electrophysiologist: Allred 04/18/2021 Weight: 226 lbs 05/22/2021 Office Weight: 245 lbs   AT/AF Burden: <1% (taking Eliquis)                                                          Attempted call to patient and unable to reach.  Left detailed message per DPR regarding transmission. Transmission reviewed.      CorVue thoracic impedance suggesting normal fluid levels.   Prescribed: Furosemide 40 mg take 1 tablet daily Potassium 20 mEq take 1 tablet daily   Labs: 04/28/2021 10.5, BUN 27, Potassium 4.0, Sodium 140, GFR 53.49 A complete set of results can be found in Results Review.   Recommendations:  Left voice mail with ICM number and encouraged to call if experiencing any fluid symptoms.   Next ICM clinic phone appointment due: 07/25/2021.     Next 91 day remote transmission due: 07/24/2021.     EP/Cardiology Office Visits:  Recall 01/26/2022 with Dr Quentin Ore.   Copy of ICM check sent to Dr. Rayann Heman.   3 month ICM trend: 06/22/2021.    12-14 Month ICM trend:     [name], RN 06/23/2021 10:25 AM

## 2021-06-23 NOTE — Telephone Encounter (Signed)
Remote ICM transmission received.  Attempted call to patient regarding ICM remote transmission and left detailed message per DPR.  Advised to return call for any fluid symptoms or questions. Next ICM remote transmission scheduled 07/25/2021.

## 2021-06-25 LAB — CUP PACEART REMOTE DEVICE CHECK
Battery Remaining Longevity: 20 mo
Battery Remaining Percentage: 18 %
Battery Voltage: 2.75 V
Brady Statistic AP VP Percent: 1 %
Brady Statistic AP VS Percent: 1.4 %
Brady Statistic AS VP Percent: 1 %
Brady Statistic AS VS Percent: 98 %
Brady Statistic RA Percent Paced: 1 %
Brady Statistic RV Percent Paced: 1 %
Date Time Interrogation Session: 20230615130200
HighPow Impedance: 41 Ohm
HighPow Impedance: 41 Ohm
Implantable Lead Implant Date: 20080423
Implantable Lead Implant Date: 20080423
Implantable Lead Implant Date: 20140918
Implantable Lead Location: 753858
Implantable Lead Location: 753859
Implantable Lead Location: 753860
Implantable Lead Model: 5076
Implantable Lead Model: 6947
Implantable Pulse Generator Implant Date: 20140918
Lead Channel Impedance Value: 430 Ohm
Lead Channel Impedance Value: 510 Ohm
Lead Channel Pacing Threshold Amplitude: 0.5 V
Lead Channel Pacing Threshold Amplitude: 0.75 V
Lead Channel Pacing Threshold Pulse Width: 0.5 ms
Lead Channel Pacing Threshold Pulse Width: 0.5 ms
Lead Channel Sensing Intrinsic Amplitude: 1.3 mV
Lead Channel Sensing Intrinsic Amplitude: 12 mV
Lead Channel Setting Pacing Amplitude: 2 V
Lead Channel Setting Pacing Amplitude: 2.5 V
Lead Channel Setting Pacing Pulse Width: 0.5 ms
Lead Channel Setting Sensing Sensitivity: 0.5 mV
Pulse Gen Serial Number: 7070007

## 2021-06-27 ENCOUNTER — Other Ambulatory Visit: Payer: Self-pay | Admitting: *Deleted

## 2021-06-27 DIAGNOSIS — Z1231 Encounter for screening mammogram for malignant neoplasm of breast: Secondary | ICD-10-CM

## 2021-07-10 ENCOUNTER — Other Ambulatory Visit: Payer: Self-pay | Admitting: Internal Medicine

## 2021-07-21 ENCOUNTER — Ambulatory Visit: Payer: Medicare HMO | Admitting: Podiatry

## 2021-07-24 ENCOUNTER — Ambulatory Visit (INDEPENDENT_AMBULATORY_CARE_PROVIDER_SITE_OTHER): Payer: Medicare HMO

## 2021-07-24 ENCOUNTER — Other Ambulatory Visit: Payer: Self-pay | Admitting: Cardiology

## 2021-07-24 DIAGNOSIS — I255 Ischemic cardiomyopathy: Secondary | ICD-10-CM

## 2021-07-24 DIAGNOSIS — E119 Type 2 diabetes mellitus without complications: Secondary | ICD-10-CM

## 2021-07-24 DIAGNOSIS — I5042 Chronic combined systolic (congestive) and diastolic (congestive) heart failure: Secondary | ICD-10-CM

## 2021-07-24 DIAGNOSIS — I2510 Atherosclerotic heart disease of native coronary artery without angina pectoris: Secondary | ICD-10-CM

## 2021-07-24 DIAGNOSIS — I10 Essential (primary) hypertension: Secondary | ICD-10-CM

## 2021-07-24 DIAGNOSIS — I472 Ventricular tachycardia, unspecified: Secondary | ICD-10-CM

## 2021-07-24 DIAGNOSIS — Z95810 Presence of automatic (implantable) cardiac defibrillator: Secondary | ICD-10-CM

## 2021-07-24 DIAGNOSIS — I700 Atherosclerosis of aorta: Secondary | ICD-10-CM

## 2021-07-25 ENCOUNTER — Ambulatory Visit (INDEPENDENT_AMBULATORY_CARE_PROVIDER_SITE_OTHER): Payer: Medicare HMO

## 2021-07-25 DIAGNOSIS — Z95810 Presence of automatic (implantable) cardiac defibrillator: Secondary | ICD-10-CM | POA: Diagnosis not present

## 2021-07-25 DIAGNOSIS — I5042 Chronic combined systolic (congestive) and diastolic (congestive) heart failure: Secondary | ICD-10-CM

## 2021-07-25 NOTE — Progress Notes (Unsigned)
EPIC Encounter for ICM Monitoring  Patient Name: [name] is a 71 y.o. female Date: 07/25/2021 Primary Care Physican: Everardo Beals, NP Primary Cardiologist: Skains/Weaver PA Electrophysiologist: Allred 04/18/2021 Weight: 226 lbs 05/22/2021 Office Weight: 245 lbs 07/26/2021 Weight: 240 lbs   AT/AF Burden: <1% (taking Eliquis)                                                          Spoke with patient and heart failure questions reviewed.  Pt asymptomatic for fluid accumulation.  Reports feeling well at this time and voices no complaints.     CorVue thoracic impedance suggesting normal fluid levels.   Prescribed: Furosemide 40 mg take 1 tablet daily Potassium 20 mEq take 1 tablet daily   Labs: 04/28/2021 10.5, BUN 27, Potassium 4.0, Sodium 140, GFR 53.49 A complete set of results can be found in Results Review.   Recommendations:  No changes and encouraged to call if experiencing any fluid symptoms.   Next ICM clinic phone appointment due: 08/28/2021.     Next 91 day remote transmission due: 10/23/2021.     EP/Cardiology Office Visits:  Recall 01/26/2022 with Dr Quentin Ore.   Copy of ICM check sent to Dr. Rayann Heman.   3 month ICM trend: 07/24/2021.    12-14 Month ICM trend:     [name], RN 07/25/2021 7:44 AM

## 2021-07-26 LAB — CUP PACEART REMOTE DEVICE CHECK
Battery Remaining Longevity: 20 mo
Battery Remaining Percentage: 18 %
Battery Voltage: 2.75 V
Brady Statistic AP VP Percent: 1 %
Brady Statistic AP VS Percent: 1.3 %
Brady Statistic AS VP Percent: 1 %
Brady Statistic AS VS Percent: 98 %
Brady Statistic RA Percent Paced: 1 %
Brady Statistic RV Percent Paced: 1 %
Date Time Interrogation Session: 20230717143923
HighPow Impedance: 43 Ohm
HighPow Impedance: 43 Ohm
Implantable Lead Implant Date: 20080423
Implantable Lead Implant Date: 20080423
Implantable Lead Implant Date: 20140918
Implantable Lead Location: 753858
Implantable Lead Location: 753859
Implantable Lead Location: 753860
Implantable Lead Model: 5076
Implantable Lead Model: 6947
Implantable Pulse Generator Implant Date: 20140918
Lead Channel Impedance Value: 400 Ohm
Lead Channel Impedance Value: 510 Ohm
Lead Channel Pacing Threshold Amplitude: 0.5 V
Lead Channel Pacing Threshold Amplitude: 0.75 V
Lead Channel Pacing Threshold Pulse Width: 0.5 ms
Lead Channel Pacing Threshold Pulse Width: 0.5 ms
Lead Channel Sensing Intrinsic Amplitude: 1.4 mV
Lead Channel Sensing Intrinsic Amplitude: 12 mV
Lead Channel Setting Pacing Amplitude: 2 V
Lead Channel Setting Pacing Amplitude: 2.5 V
Lead Channel Setting Pacing Pulse Width: 0.5 ms
Lead Channel Setting Sensing Sensitivity: 0.5 mV
Pulse Gen Serial Number: 7070007

## 2021-07-31 ENCOUNTER — Ambulatory Visit
Admission: RE | Admit: 2021-07-31 | Discharge: 2021-07-31 | Disposition: A | Discharge status: Home / Self Care | Payer: Medicare HMO | Source: Ambulatory Visit | Attending: *Deleted | Admitting: *Deleted

## 2021-07-31 DIAGNOSIS — Z1231 Encounter for screening mammogram for malignant neoplasm of breast: Secondary | ICD-10-CM

## 2021-08-02 ENCOUNTER — Ambulatory Visit (INDEPENDENT_AMBULATORY_CARE_PROVIDER_SITE_OTHER): Payer: Medicare HMO | Admitting: Podiatry

## 2021-08-02 ENCOUNTER — Encounter: Payer: Self-pay | Admitting: Podiatry

## 2021-08-02 DIAGNOSIS — E119 Type 2 diabetes mellitus without complications: Secondary | ICD-10-CM | POA: Diagnosis not present

## 2021-08-02 DIAGNOSIS — B351 Tinea unguium: Secondary | ICD-10-CM | POA: Diagnosis not present

## 2021-08-02 DIAGNOSIS — M2141 Flat foot [pes planus] (acquired), right foot: Secondary | ICD-10-CM | POA: Diagnosis not present

## 2021-08-02 DIAGNOSIS — M79675 Pain in left toe(s): Secondary | ICD-10-CM

## 2021-08-02 DIAGNOSIS — E1151 Type 2 diabetes mellitus with diabetic peripheral angiopathy without gangrene: Secondary | ICD-10-CM | POA: Diagnosis not present

## 2021-08-02 DIAGNOSIS — M79674 Pain in right toe(s): Secondary | ICD-10-CM

## 2021-08-02 DIAGNOSIS — L600 Ingrowing nail: Secondary | ICD-10-CM

## 2021-08-02 DIAGNOSIS — M2142 Flat foot [pes planus] (acquired), left foot: Secondary | ICD-10-CM

## 2021-08-07 NOTE — Progress Notes (Signed)
ANNUAL DIABETIC FOOT EXAM  Subjective: [name] presents today for annual diabetic foot examination.  Patient confirms h/o diabetes.  Patient relates 30 year h/o diabetes.  Patient denies any h/o foot wounds.  Patient has been diagnosed with neuropathy and it is managed with gabapentin.  Patient's blood sugar was 145 mg/dl today. Last known  HgA1c was 7.2%   Risk factors: diabetes, diabetic neuropathy, h/o DVT, CAD, hyperlipidemia.  She is followed by Vascular Surgery.  Everardo Beals, NP is patient's PCP. Last visit was May 30, 2021.  Past Medical History:  Diagnosis Date   Abnormal LFTs    AICD (automatic cardioverter/defibrillator) present    Angina decubitus ([name]) 11/06/2012   Angina pectoris ([place])    Anxiety    Arthritis    Back pain    Bundle branch block 05/2016   CAD (coronary artery disease) 06/18/2010   CHF (congestive heart failure) (HCC)    Chronic combined systolic and diastolic CHF, NYHA class 3 (HCC)    Chronic combined systolic and diastolic heart failure ([place])    Chronic systolic dysfunction of left ventricle 09/24/2012   Coronary artery disease    s/p CABG 2007   Depression    Dermal mycosis    Diabetes mellitus    type II   DKA (diabetic ketoacidoses) 01/12/2019   DVT (deep venous thrombosis) ([place]) 09/2015   bilateral   Edema, lower extremity    Glaucoma    Hyperlipidemia    Hypertension    Hypoglycemia 09/09/2012   Insomnia    Insulin dependent diabetes mellitus ([place]) 04/23/2012   Ischemic cardiomyopathy    EF 30-35%, s/p ICD 4/08 by Dr Leonia Reeves   Joint pain    LBBB (left bundle branch block) 09/24/2012   Morbid obesity ([place]) 11/06/2012   Obesity    Oxygen dependent 2 L   Pain in left knee    Peripheral neuropathy    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   LIMA to D2, SVG to ramus intermediate, sequential SVG to OM1-OM2, SVG to RCA with EVH via both legs    Sleep apnea    uses O2 at night   Tinea    Type 2 diabetes mellitus with hyperglycemia,  with long-term current use of insulin ([place]) 04/20/2019   Uncontrolled diabetes mellitus 02/2019   Ventricular tachycardia ([place]) 01/16/15   sustained VT terminated with ATP, CL 340 msec   Vitamin B 12 deficiency 11/05/2018   Vitamin D deficiency 11/05/2018   Patient Active Problem List   Diagnosis Date Noted   Preop cardiovascular exam 02/10/2021   Adnexal mass 12/23/2020   Polyneuropathy associated with underlying disease ([place]) 09/01/2020   Acute pansinusitis 03/18/2020   Diabetic peripheral neuropathy ([place]) 03/18/2020   Urine finding 03/18/2020   Aortic atherosclerosis ([place]) 01/13/2020   Diabetes mellitus with coincident hypertension ([place]) 01/13/2020   Type 2 diabetes mellitus with diabetic polyneuropathy, with long-term current use of insulin ([place]) 07/16/2019   Type 2 diabetes mellitus with hyperglycemia, with long-term current use of insulin ([place]) 04/20/2019   Diabetes mellitus ([place]) 04/20/2019   Uncontrolled diabetes mellitus 02/20/2019   Pressure injury of skin 02/20/2019   Demand ischemia (HCC)    Abnormal LFTs    Acute pancreatitis 01/12/2019   DKA (diabetic ketoacidoses) 01/12/2019   Vitamin D deficiency 11/05/2018   Vitamin B 12 deficiency 11/05/2018   Osteoarthritis of knee 07/02/2018   Tinea    Sleep apnea    Oxygen dependent    Obesity    Insomnia  Hyperlipidemia    Dermal mycosis    Depression    Coronary artery disease    Arthritis    Anxiety    ICD (implantable cardioverter-defibrillator) in place    Heme positive stool    Benign neoplasm of transverse colon    Bundle branch block 05/08/2016   Left leg pain    DVT (deep venous thrombosis) (HCC) 09/30/2015   Syncope 03/11/2015   AKI (acute kidney injury) ([place]) 03/11/2015   Hypotension 03/11/2015   Syncope and collapse 03/11/2015   Ventricular tachycardia ([place]) 01/17/2015   Pain in the chest    SOB (shortness of breath)    Thyroid nodule    CAP (community acquired pneumonia)    Chest pain 05/26/2014    Right flank pain 05/26/2014   Right flank discomfort    Chronic combined systolic and diastolic heart failure (HCC)    Angina pectoris ([place])    Morbid obesity ([place]) 11/06/2012   Angina decubitus ([place]) 11/06/2012   LBBB (left bundle branch block) 40/98/1191   Chronic systolic dysfunction of left ventricle 09/24/2012   Hypoglycemia 09/09/2012   HLD (hyperlipidemia) 04/23/2012   Insulin dependent diabetes mellitus ([place]) 04/23/2012   Acute renal failure ([place]) 03/10/2012   Ischemic cardiomyopathy 06/18/2010   CAD (coronary artery disease) 06/18/2010   Hypertension 06/18/2010   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   Past Surgical History:  Procedure Laterality Date   BI-VENTRICULAR IMPLANTABLE CARDIOVERTER DEFIBRILLATOR UPGRADE N/A 09/25/2012   Procedure: BI-VENTRICULAR IMPLANTABLE CARDIOVERTER DEFIBRILLATOR UPGRADE;  Surgeon: Coralyn Mark, MD;  Location: [facility] CATH LAB;  Service: Cardiovascular;  Laterality: N/A;   BREAST BIOPSY Right 04/05/2014   CARDIAC CATHETERIZATION     CARDIAC DEFIBRILLATOR PLACEMENT  4/08   by Dr Leonia Reeves (MDT)   COLONOSCOPY WITH PROPOFOL N/A 10/12/2016   Procedure: COLONOSCOPY WITH PROPOFOL;  Surgeon: Doran Stabler, MD;  Location: [facility];  Service: Gastroenterology;  Laterality: N/A;   CORONARY ARTERY BYPASS GRAFT  10/05/2005   by Dr Cyndia Bent   IMPLANTABLE CARDIOVERTER DEFIBRILLATOR IMPLANT  09/25/12   attempt of upgrade to CRT-D unsuccessful due to CS anatomy, SJM Unify [place] device placed with LV port capped by Dr Rayann Heman   IR GENERIC HISTORICAL  10/03/2015   IR US GUIDE VASC ACCESS LEFT 10/03/2015 Jacqulynn Cadet, MD [facility]   IR GENERIC HISTORICAL  10/03/2015   IR VENO/EXT/UNI LEFT 10/03/2015 Jacqulynn Cadet, MD [facility]   IR GENERIC HISTORICAL  10/03/2015   IR VENOCAVAGRAM IVC 10/03/2015 Jacqulynn Cadet, MD [facility]   IR GENERIC HISTORICAL  10/03/2015   IR INFUSION THROMBOL VENOUS INITIAL (MS) 10/03/2015 Jacqulynn Cadet, MD [facility]   IR  GENERIC HISTORICAL  10/03/2015   IR US GUIDE VASC ACCESS LEFT 10/03/2015 Jacqulynn Cadet, MD [facility]   IR GENERIC HISTORICAL  10/04/2015   IR THROMBECT VENO MECH MOD SED 10/04/2015 Sandi Mariscal, MD [facility]   IR GENERIC HISTORICAL  10/04/2015   IR TRANSCATH PLC STENT 1ST ART NOT LE CV CAR VERT CAR 10/04/2015 Sandi Mariscal, MD [facility]   IR GENERIC HISTORICAL  10/04/2015   IR THROMB F/U EVAL ART/VEN FINAL DAY (MS) 10/04/2015 Sandi Mariscal, MD [facility]   IR GENERIC HISTORICAL  11/02/2015   IR RADIOLOGIST EVAL & MGMT 11/02/2015 Sandi Mariscal, MD [facility]   IR RADIOLOGIST EVAL & MGMT  04/26/2016   LEFT HEART CATH AND CORS/GRAFTS ANGIOGRAPHY N/A 01/19/2019   Procedure: LEFT HEART CATH AND CORS/GRAFTS ANGIOGRAPHY;  Surgeon: Belva Crome,  MD;  Location: [facility];  Service: Cardiovascular;  Laterality: N/A;   Current Outpatient Medications on File Prior to Visit  Medication Sig Dispense Refill   clotrimazole-betamethasone (LOTRISONE) cream Apply 1 application twice a day by topical route as directed for 14 days.     gabapentin (NEURONTIN) 300 MG capsule Take by mouth.     lisinopril (ZESTRIL) 2.5 MG tablet Take 1 tablet by mouth daily.     meloxicam (MOBIC) 15 MG tablet Take 1 tablet by mouth daily.     albuterol (PROVENTIL) (2.5 MG/3ML) 0.083% nebulizer solution Take 2.5 mg by nebulization every 6 (six) hours as needed for wheezing or shortness of breath.      Alcohol Swabs (ALCOHOL WIPES) 70 % PADS DropSafe Alcohol Prep Pads  USE WHEN CHECKING BLOOD SUGARS     amitriptyline (ELAVIL) 10 MG tablet Take by mouth.     apixaban (ELIQUIS) 5 MG TABS tablet Take 1 tablet (5 mg total) by mouth 2 (two) times daily. 180 tablet 1   Baclofen 5 MG TABS Take by mouth.     Calcium Carbonate-Vit D-Min (CALCIUM 600+D PLUS MINERALS) 600-400 MG-UNIT TABS Take 1 tablet by mouth daily.     carvedilol (COREG) 6.25 MG tablet Take 1 tablet (6.25 mg total) by mouth 2 (two) times daily with a meal.  60 tablet 0   ciprofloxacin (CIPRO) 500 MG tablet Take by mouth.     Continuous Blood Gluc Sensor (DEXCOM G6 SENSOR) MISC 1 Device by Does not apply route as directed. 9 each 3   Continuous Blood Gluc Transmit (DEXCOM G6 TRANSMITTER) MISC 1 Device by Does not apply route as directed. 1 each 3   cyanocobalamin (,VITAMIN B-12,) 1000 MCG/ML injection 1,000 mcg every 30 (thirty) days.     dapagliflozin propanediol (FARXIGA) 10 MG TABS tablet      DROPLET PEN NEEDLES 32G X 4 MM MISC USE IN THE MORNING, AT NOON, IN THE EVENING, AND AT BEDTIME. 400 each 3   ferrous sulfate 325 (65 FE) MG EC tablet Take 325 mg by mouth daily.      fluconazole (DIFLUCAN) 150 MG tablet Take 150 mg by mouth daily.     furosemide (LASIX) 40 MG tablet TAKE 1 TABLET EVERY DAY 90 tablet 3   isosorbide dinitrate (ISORDIL) 30 MG tablet Take 30 mg by mouth every morning.     isosorbide mononitrate (IMDUR) 30 MG 24 hr tablet Take by mouth.     latanoprost (XALATAN) 0.005 % ophthalmic solution Administer 1 drop to both eyes nightly.     methocarbamol (ROBAXIN) 500 MG tablet Take 1 tablet (500 mg total) by mouth 2 (two) times daily as needed for muscle spasms.     nitroGLYCERIN (NITROSTAT) 0.4 MG SL tablet Place 0.4 mg under the tongue every 5 (five) minutes as needed for chest pain.     NOVOLOG FLEXPEN 100 UNIT/ML FlexPen Max daily 30 units per scale (Patient taking differently: 6-12 Units daily as needed for high blood sugar. Max daily 30 units per scale. Sliding scale) 30 mL 3   potassium chloride SA (KLOR-CON) 20 MEQ tablet Take 20 mEq by mouth daily as needed (for low pottassium).     rosuvastatin (CRESTOR) 20 MG tablet Take 1 tablet (20 mg total) by mouth daily at 6 PM. 30 tablet 2   traMADol (ULTRAM) 50 MG tablet Take 50 mg by mouth daily.     TRESIBA FLEXTOUCH 100 UNIT/ML FlexTouch Pen INJECT 24 UNITS INTO THE SKIN  DAILY. 30 mL 3   Vitamin D, Ergocalciferol, (DRISDOL) 1.25 MG (50000 UT) CAPS capsule Take 1 capsule (50,000  Units total) by mouth every 7 (seven) days. 4 capsule 0   No current facility-administered medications on file prior to visit.    Allergies  Allergen Reactions   Lipitor  [Atorvastatin Calcium] Rash   Metformin And Related Itching, Swelling and Other (See Comments)    Leg pain & swelling in legs   Ozempic (0.25 Or 0.5 Mg-Dose) [Semaglutide(0.25 Or 0.'5mg'$ -Dos)] Other (See Comments)    Pt with hx of pancreatitis    Rosuvastatin    Atorvastatin Itching and Rash   Atorvastatin Calcium Itching and Rash   Levofloxacin Itching   Social History   Occupational History   Occupation: retired    Fish farm manager: UNEMPLOYED  Tobacco Use   Smoking status: Never   Smokeless tobacco: Never  Vaping Use   Vaping Use: Never used  Substance and Sexual Activity   Alcohol use: No   Drug use: No   Sexual activity: Not Currently   Family History  Problem Relation Age of Onset   Diabetes Mother 54       died - HTN   Stroke Mother    High blood pressure Mother    Sudden death Mother    Obesity Mother    Other Other        No early family hx of CAD   Immunization History  Administered Date(s) Administered   Influenza Inj Mdck Quad Pf 11/16/2016, 12/02/2018   Influenza Split 02/01/2012   Influenza, Seasonal, Injecte, Preservative Fre 02/03/2015   Influenza,inj,Quad PF,6+ Mos 11/19/2013, 02/28/2018   Influenza,inj,quad, With Preservative 09/30/2015, 10/24/2019   Influenza-Unspecified 09/30/2015   Tdap 01/05/2021     Review of Systems: Negative except as noted in the HPI.   Objective: There were no vitals filed for this visit.  [name] is a pleasant 71 y.o. female in NAD. AAO X 3.  Vascular Examination: CFT <4 seconds b/l. DP pulses diminished b/l. PT pulses diminished b/l. Digital hair absent. Skin temperature gradient warm to cool b/l. No ischemia or gangrene. No cyanosis or clubbing noted b/l. Trace edema noted BLE.   Neurological Examination:Pt has subjective symptoms of  neuropathy. Protective sensation decreased with 10 gram monofilament b/l. Vibratory sensation intact b/l.  Dermatological Examination: Pedal skin thin, shiny and atrophic b/l. Toenails 1-5 b/l thick, discolored, elongated with subungual debris and pain on dorsal palpation.  Healing abrasion(s) with intact scab noted L 3rd toe. No erythema, no edema, no drainage, no fluctuance.  Musculoskeletal Examination: Muscle strength 5/5 to b/l LE. Pes planus deformity noted bilateral LE.  Radiographs: None  Footwear Assessment: Does the patient wear appropriate shoes? Yes. Does the patient need inserts/orthotics? Yes.  Last A1c:      Latest Ref Rng & Units 04/28/2021    9:43 AM 12/23/2020    9:52 AM 09/02/2020    8:32 AM  Hemoglobin A1C  Hemoglobin-A1c 4.0 - 5.6 % 8.2  6.3  6.8     ADA Risk Categorization: High Risk  Patient has one or more of the following: Loss of protective sensation Absent pedal pulses Severe Foot deformity History of foot ulcer  Assessment: 1. Pain due to onychomycosis of toenails of both feet   2. Ingrown toenail without infection   3. Pes planus of both feet   4. Type II diabetes mellitus with peripheral circulatory disorder (HCC)   5. Encounter for diabetic foot exam ([place])  Plan: -Patient was evaluated and treated. All patient's and/or POA's questions/concerns answered on today's visit. -Examined patient. Healing abrasion left 3rd toe. Monitor. -Diabetic foot examination performed today. -Continue foot and shoe inspections daily. Monitor blood glucose per PCP/Endocrinologist's recommendations. -Mycotic toenails 1-5 bilaterally were debrided in length and girth with sterile nail nippers and dremel without incident. -No invasive procedure performed. Offending nail border debrided and curretaged lateral border left hallux and medial border right hallux utilizing sterile nail nipper and currette. Border cleansed with alcohol and triple antibiotic applied. No  further treatment required by patient/caregiver. Call office if there are any concerns. -Patient/POA to call should there be question/concern in the interim. Return in about 3 months (around 11/02/2021).  Marzetta Board, DPM

## 2021-08-16 ENCOUNTER — Other Ambulatory Visit: Payer: Self-pay

## 2021-08-16 ENCOUNTER — Encounter (INDEPENDENT_AMBULATORY_CARE_PROVIDER_SITE_OTHER): Payer: Self-pay

## 2021-08-16 ENCOUNTER — Observation Stay (HOSPITAL_COMMUNITY)
Admission: EM | Admit: 2021-08-16 | Discharge: 2021-08-20 | Disposition: A | Discharge status: Home Health | Payer: Medicare HMO | Attending: Internal Medicine | Admitting: Internal Medicine

## 2021-08-16 ENCOUNTER — Emergency Department (HOSPITAL_COMMUNITY): Payer: Medicare HMO

## 2021-08-16 DIAGNOSIS — Z95810 Presence of automatic (implantable) cardiac defibrillator: Secondary | ICD-10-CM | POA: Diagnosis present

## 2021-08-16 DIAGNOSIS — R9431 Abnormal electrocardiogram [ECG] [EKG]: Secondary | ICD-10-CM | POA: Diagnosis not present

## 2021-08-16 DIAGNOSIS — N83201 Unspecified ovarian cyst, right side: Secondary | ICD-10-CM | POA: Diagnosis present

## 2021-08-16 DIAGNOSIS — I130 Hypertensive heart and chronic kidney disease with heart failure and stage 1 through stage 4 chronic kidney disease, or unspecified chronic kidney disease: Secondary | ICD-10-CM | POA: Insufficient documentation

## 2021-08-16 DIAGNOSIS — N1831 Chronic kidney disease, stage 3a: Secondary | ICD-10-CM | POA: Insufficient documentation

## 2021-08-16 DIAGNOSIS — Z7901 Long term (current) use of anticoagulants: Secondary | ICD-10-CM | POA: Insufficient documentation

## 2021-08-16 DIAGNOSIS — Z794 Long term (current) use of insulin: Secondary | ICD-10-CM | POA: Diagnosis not present

## 2021-08-16 DIAGNOSIS — Z951 Presence of aortocoronary bypass graft: Secondary | ICD-10-CM | POA: Insufficient documentation

## 2021-08-16 DIAGNOSIS — N390 Urinary tract infection, site not specified: Secondary | ICD-10-CM | POA: Diagnosis not present

## 2021-08-16 DIAGNOSIS — R112 Nausea with vomiting, unspecified: Secondary | ICD-10-CM | POA: Diagnosis present

## 2021-08-16 DIAGNOSIS — Z79899 Other long term (current) drug therapy: Secondary | ICD-10-CM | POA: Diagnosis not present

## 2021-08-16 DIAGNOSIS — R7401 Elevation of levels of liver transaminase levels: Secondary | ICD-10-CM | POA: Diagnosis present

## 2021-08-16 DIAGNOSIS — E119 Type 2 diabetes mellitus without complications: Secondary | ICD-10-CM

## 2021-08-16 DIAGNOSIS — Z7984 Long term (current) use of oral hypoglycemic drugs: Secondary | ICD-10-CM | POA: Diagnosis not present

## 2021-08-16 DIAGNOSIS — I959 Hypotension, unspecified: Secondary | ICD-10-CM | POA: Diagnosis not present

## 2021-08-16 DIAGNOSIS — E861 Hypovolemia: Secondary | ICD-10-CM

## 2021-08-16 DIAGNOSIS — N189 Chronic kidney disease, unspecified: Secondary | ICD-10-CM | POA: Diagnosis present

## 2021-08-16 DIAGNOSIS — E1159 Type 2 diabetes mellitus with other circulatory complications: Secondary | ICD-10-CM

## 2021-08-16 DIAGNOSIS — R2689 Other abnormalities of gait and mobility: Secondary | ICD-10-CM | POA: Insufficient documentation

## 2021-08-16 DIAGNOSIS — Z86718 Personal history of other venous thrombosis and embolism: Secondary | ICD-10-CM | POA: Diagnosis not present

## 2021-08-16 DIAGNOSIS — E1122 Type 2 diabetes mellitus with diabetic chronic kidney disease: Secondary | ICD-10-CM | POA: Insufficient documentation

## 2021-08-16 DIAGNOSIS — I2510 Atherosclerotic heart disease of native coronary artery without angina pectoris: Secondary | ICD-10-CM | POA: Diagnosis not present

## 2021-08-16 DIAGNOSIS — R197 Diarrhea, unspecified: Secondary | ICD-10-CM

## 2021-08-16 DIAGNOSIS — N179 Acute kidney failure, unspecified: Secondary | ICD-10-CM | POA: Insufficient documentation

## 2021-08-16 DIAGNOSIS — E860 Dehydration: Secondary | ICD-10-CM | POA: Diagnosis not present

## 2021-08-16 DIAGNOSIS — I5042 Chronic combined systolic (congestive) and diastolic (congestive) heart failure: Secondary | ICD-10-CM | POA: Insufficient documentation

## 2021-08-16 DIAGNOSIS — U071 COVID-19: Secondary | ICD-10-CM | POA: Diagnosis present

## 2021-08-16 DIAGNOSIS — M6281 Muscle weakness (generalized): Secondary | ICD-10-CM | POA: Diagnosis not present

## 2021-08-16 DIAGNOSIS — L8990 Pressure ulcer of unspecified site, unspecified stage: Secondary | ICD-10-CM | POA: Diagnosis present

## 2021-08-16 DIAGNOSIS — E785 Hyperlipidemia, unspecified: Secondary | ICD-10-CM | POA: Diagnosis present

## 2021-08-16 LAB — COMPREHENSIVE METABOLIC PANEL
ALT: 40 U/L (ref 0–44)
AST: 53 U/L — ABNORMAL HIGH (ref 15–41)
Albumin: 3.5 g/dL (ref 3.5–5.0)
Alkaline Phosphatase: 70 U/L (ref 38–126)
Anion gap: 12 (ref 5–15)
BUN: 25 mg/dL — ABNORMAL HIGH (ref 8–23)
CO2: 20 mmol/L — ABNORMAL LOW (ref 22–32)
Calcium: 9.4 mg/dL (ref 8.9–10.3)
Chloride: 106 mmol/L (ref 98–111)
Creatinine, Ser: 1.6 mg/dL — ABNORMAL HIGH (ref 0.44–1.00)
GFR, Estimated: 34 mL/min — ABNORMAL LOW (ref >60)
Glucose, Bld: 165 mg/dL — ABNORMAL HIGH (ref 70–99)
Potassium: 3.8 mmol/L (ref 3.5–5.1)
Sodium: 138 mmol/L (ref 135–145)
Total Bilirubin: 0.6 mg/dL (ref 0.3–1.2)
Total Protein: 7.8 g/dL (ref 6.5–8.1)

## 2021-08-16 LAB — CBC WITH DIFFERENTIAL/PLATELET
Abs Immature Granulocytes: 0.02 10*3/uL (ref 0.00–0.07)
Basophils Absolute: 0 10*3/uL (ref 0.0–0.1)
Basophils Relative: 0 %
Eosinophils Absolute: 0 10*3/uL (ref 0.0–0.5)
Eosinophils Relative: 0 %
HCT: 46.7 % — ABNORMAL HIGH (ref 36.0–46.0)
Hemoglobin: 15.2 g/dL — ABNORMAL HIGH (ref 12.0–15.0)
Immature Granulocytes: 0 %
Lymphocytes Relative: 24 %
Lymphs Abs: 1.1 10*3/uL (ref 0.7–4.0)
MCH: 32.1 pg (ref 26.0–34.0)
MCHC: 32.5 g/dL (ref 30.0–36.0)
MCV: 98.5 fL (ref 80.0–100.0)
Monocytes Absolute: 0.4 10*3/uL (ref 0.1–1.0)
Monocytes Relative: 8 %
Neutro Abs: 3.1 10*3/uL (ref 1.7–7.7)
Neutrophils Relative %: 68 %
Platelets: 127 10*3/uL — ABNORMAL LOW (ref 150–400)
RBC: 4.74 MIL/uL (ref 3.87–5.11)
RDW: 17.2 % — ABNORMAL HIGH (ref 11.5–15.5)
WBC: 4.5 10*3/uL (ref 4.0–10.5)
nRBC: 0.9 % — ABNORMAL HIGH (ref 0.0–0.2)

## 2021-08-16 LAB — BRAIN NATRIURETIC PEPTIDE: B Natriuretic Peptide: 103.5 pg/mL — ABNORMAL HIGH (ref 0.0–100.0)

## 2021-08-16 LAB — TROPONIN I (HIGH SENSITIVITY)
Troponin I (High Sensitivity): 42 ng/L — ABNORMAL HIGH (ref <18)
Troponin I (High Sensitivity): 30 ng/L — ABNORMAL HIGH (ref <18)

## 2021-08-16 LAB — URINALYSIS, ROUTINE W REFLEX MICROSCOPIC
Bilirubin Urine: NEGATIVE
Glucose, UA: >=500 mg/dL — AB
Ketones, ur: 5 mg/dL — AB
Nitrite: NEGATIVE
Protein, ur: 30 mg/dL — AB
Specific Gravity, Urine: 1.023 (ref 1.005–1.030)
pH: 5 (ref 5.0–8.0)

## 2021-08-16 LAB — LIPASE, BLOOD: Lipase: 41 U/L (ref 11–51)

## 2021-08-16 LAB — GLUCOSE, CAPILLARY
Glucose-Capillary: 184 mg/dL — ABNORMAL HIGH (ref 70–99)
Glucose-Capillary: 163 mg/dL — ABNORMAL HIGH (ref 70–99)

## 2021-08-16 LAB — SARS CORONAVIRUS 2 BY RT PCR: SARS Coronavirus 2 by RT PCR: POSITIVE — AB

## 2021-08-16 LAB — MAGNESIUM: Magnesium: 2 mg/dL (ref 1.7–2.4)

## 2021-08-16 LAB — D-DIMER, QUANTITATIVE: D-Dimer, Quant: 1.07 ug/mL-FEU — ABNORMAL HIGH (ref 0.00–0.50)

## 2021-08-16 LAB — LACTATE DEHYDROGENASE: LDH: 225 U/L — ABNORMAL HIGH (ref 98–192)

## 2021-08-16 LAB — PHOSPHORUS: Phosphorus: 3.8 mg/dL (ref 2.5–4.6)

## 2021-08-16 LAB — HEPATITIS B SURFACE ANTIGEN: Hepatitis B Surface Ag: NONREACTIVE

## 2021-08-16 LAB — FERRITIN: Ferritin: 231 ng/mL (ref 11–307)

## 2021-08-16 LAB — FIBRINOGEN: Fibrinogen: 437 mg/dL (ref 210–475)

## 2021-08-16 LAB — PROCALCITONIN: Procalcitonin: <0.1 ng/mL

## 2021-08-16 MED ORDER — INSULIN ASPART 100 UNIT/ML IJ SOLN: 0.0000 [IU] | Freq: Every day | INTRAMUSCULAR | Status: DC

## 2021-08-16 MED ORDER — SODIUM CHLORIDE 0.9 % IV SOLN: Freq: Once | INTRAVENOUS | Status: AC

## 2021-08-16 MED ORDER — APIXABAN 5 MG PO TABS
5.0000 mg | ORAL_TABLET | Freq: Two times a day (BID) | ORAL | Status: DC
  Administered 2021-08-16 – 2021-08-20 (×8): 5 mg via ORAL
  Filled 2021-08-16 (×9): qty 1

## 2021-08-16 MED ORDER — INSULIN ASPART 100 UNIT/ML IJ SOLN: 0.0000 [IU] | Freq: Three times a day (TID) | INTRAMUSCULAR | Status: DC

## 2021-08-16 MED ORDER — INSULIN ASPART 100 UNIT/ML IJ SOLN
0.0000 [IU] | Freq: Three times a day (TID) | INTRAMUSCULAR | Status: DC
  Administered 2021-08-16 – 2021-08-17 (×2): 3 [IU] via SUBCUTANEOUS
  Administered 2021-08-18: 5 [IU] via SUBCUTANEOUS
  Administered 2021-08-18 – 2021-08-19 (×3): 3 [IU] via SUBCUTANEOUS
  Administered 2021-08-19 – 2021-08-20 (×3): 5 [IU] via SUBCUTANEOUS
  Administered 2021-08-20: 3 [IU] via SUBCUTANEOUS

## 2021-08-16 MED ORDER — ONDANSETRON HCL 4 MG/2ML IJ SOLN: 4.0000 mg | Freq: Four times a day (QID) | INTRAMUSCULAR | Status: DC | PRN

## 2021-08-16 MED ORDER — ALBUTEROL SULFATE HFA 108 (90 BASE) MCG/ACT IN AERS
2.0000 | INHALATION_SPRAY | Freq: Four times a day (QID) | RESPIRATORY_TRACT | Status: DC
Start: 2021-08-16 — End: 2021-08-17
  Administered 2021-08-16 – 2021-08-17 (×4): 2 via RESPIRATORY_TRACT
  Filled 2021-08-16: qty 6.7

## 2021-08-16 MED ORDER — SODIUM CHLORIDE 0.9 % IV BOLUS
1000.0000 mL | Freq: Once | INTRAVENOUS | Status: AC
  Administered 2021-08-16: 1000 mL via INTRAVENOUS

## 2021-08-16 MED ORDER — ROSUVASTATIN CALCIUM 20 MG PO TABS
20.0000 mg | ORAL_TABLET | Freq: Every day | ORAL | Status: DC
  Administered 2021-08-16 – 2021-08-19 (×4): 20 mg via ORAL
  Filled 2021-08-16 (×5): qty 1

## 2021-08-16 MED ORDER — GUAIFENESIN-DM 100-10 MG/5ML PO SYRP
10.0000 mL | ORAL_SOLUTION | ORAL | Status: DC | PRN
  Administered 2021-08-16: 10 mL via ORAL
  Filled 2021-08-16: qty 10

## 2021-08-16 MED ORDER — ASCORBIC ACID 500 MG PO TABS
500.0000 mg | ORAL_TABLET | Freq: Every day | ORAL | Status: DC
  Administered 2021-08-16 – 2021-08-20 (×5): 500 mg via ORAL
  Filled 2021-08-16 (×5): qty 1

## 2021-08-16 MED ORDER — TRIMETHOBENZAMIDE HCL 100 MG/ML IM SOLN
200.0000 mg | Freq: Four times a day (QID) | INTRAMUSCULAR | Status: DC | PRN
  Filled 2021-08-16: qty 2

## 2021-08-16 MED ORDER — APIXABAN 5 MG PO TABS
5.0000 mg | ORAL_TABLET | Freq: Two times a day (BID) | ORAL | Status: DC
Start: 2021-08-16 — End: 2021-08-16

## 2021-08-16 MED ORDER — HYDROCOD POLI-CHLORPHE POLI ER 10-8 MG/5ML PO SUER: 5.0000 mL | Freq: Two times a day (BID) | ORAL | Status: DC | PRN

## 2021-08-16 MED ORDER — HYDROCORTISONE 1 % EX CREA
TOPICAL_CREAM | Freq: Two times a day (BID) | CUTANEOUS | Status: DC
  Filled 2021-08-16: qty 28

## 2021-08-16 MED ORDER — INSULIN ASPART 100 UNIT/ML IJ SOLN
0.0000 [IU] | Freq: Every day | INTRAMUSCULAR | Status: DC
  Administered 2021-08-17 – 2021-08-19 (×3): 2 [IU] via SUBCUTANEOUS

## 2021-08-16 MED ORDER — SODIUM CHLORIDE 0.9 % IV SOLN
2.0000 g | INTRAVENOUS | Status: DC
  Administered 2021-08-16 – 2021-08-19 (×4): 2 g via INTRAVENOUS
  Filled 2021-08-16 (×4): qty 20

## 2021-08-16 MED ORDER — ONDANSETRON HCL 4 MG PO TABS: 4.0000 mg | ORAL_TABLET | Freq: Four times a day (QID) | ORAL | Status: DC | PRN

## 2021-08-16 MED ORDER — ZINC SULFATE 220 (50 ZN) MG PO CAPS
220.0000 mg | ORAL_CAPSULE | Freq: Every day | ORAL | Status: DC
  Administered 2021-08-16 – 2021-08-20 (×5): 220 mg via ORAL
  Filled 2021-08-16 (×5): qty 1

## 2021-08-16 MED ORDER — MOLNUPIRAVIR EUA 200MG CAPSULE
4.0000 | ORAL_CAPSULE | Freq: Two times a day (BID) | ORAL | Status: DC
  Administered 2021-08-16 – 2021-08-20 (×9): 800 mg via ORAL
  Filled 2021-08-16 (×3): qty 4

## 2021-08-16 NOTE — H&P (Addendum)
History and Physical    Patient: [name] RDE:[id number] DOB: 1950-11-15 DOA: 08/16/2021 DOS: the patient was seen and examined on 08/16/2021 PCP: Everardo Beals, NP  Patient coming from: Home via EMS  Chief Complaint:  Chief Complaint  Patient presents with   Weakness   Diarrhea   Emesis   HPI: [name] is a 71 y.o. female with medical history significant of combined CHF, s/p AICD, CAD s/p CABG, DM type II, chronic respiratory failure on 2 L of oxygen, history of DVT on chronic anticoagulation, anxiety, depression, and OSA who presents with complaints of nausea, vomiting, and diarrhea which started yesterday evening after she had at [employer] shrimp.  Emesis and diarrhea were noted to be nonbloody.'s.  Nobody else in the household had different.  However she had not been feeling well over the last couple of days and complained of fatigue, weakness, and intermittent productive cough.  Her son noticed that the patient has oxygen at home, but does not wear it all the time.  Patient denied having any significant fever, recent sick contacts to her knowledge, chest pain, shortness of breath, abdominal pain, or dysuria.  She has not recently been on antibiotics, but did admit to decreased p.o. intake.  Patient never received any of the COVID-19 vaccinations.  Her son notes that the patient has been following at [place] for a lesion on her right ovary, but due to transportation issues had been having difficulty following back up.  On admission to the emergency department patient was seen with temperature up to 99.1 F with a pulse 58-67, respirations 16-24, blood pressure 92/52-122/64, and O2 saturations currently maintained on 1-3 L of nasal cannula oxygen.  Labs significant for hemoglobin 13.2, platelet count 127, BUN 25, creatinine 1.60, and glucose 165.  Chest x-ray noted low lung volumes and likely vascular congestion with suspected small pleural effusions possibly related with CHF.   CT scan of the abdomen and pelvis noted enlarging cystic right ovarian lesion now 6.9 cm.  COVID-19 screening positive.  Patient has been given 1 L of normal saline IV fluids and then placed on a rate of 100 mL/h for additional 1 L.  Review of Systems: As mentioned in the history of present illness. All other systems reviewed and are negative. Past Medical History:  Diagnosis Date   Abnormal LFTs    AICD (automatic cardioverter/defibrillator) present    Angina decubitus ([place]) 11/06/2012   Angina pectoris ([place])    Anxiety    Arthritis    Back pain    Bundle branch block 05/2016   CAD (coronary artery disease) 06/18/2010   CHF (congestive heart failure) (HCC)    Chronic combined systolic and diastolic CHF, NYHA class 3 (HCC)    Chronic combined systolic and diastolic heart failure ([place])    Chronic systolic dysfunction of left ventricle 09/24/2012   Coronary artery disease    s/p CABG 2007   Depression    Dermal mycosis    Diabetes mellitus    type II   DKA (diabetic ketoacidoses) 01/12/2019   DVT (deep venous thrombosis) ([place]) 09/2015   bilateral   Edema, lower extremity    Glaucoma    Hyperlipidemia    Hypertension    Hypoglycemia 09/09/2012   Insomnia    Insulin dependent diabetes mellitus ([place]) 04/23/2012   Ischemic cardiomyopathy    EF 30-35%, s/p ICD 4/08 by Dr Leonia Reeves   Joint pain    LBBB (left bundle branch block) 09/24/2012  Morbid obesity ([place]) 11/06/2012   Obesity    Oxygen dependent 2 L   Pain in left knee    Peripheral neuropathy    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   LIMA to D2, SVG to ramus intermediate, sequential SVG to OM1-OM2, SVG to RCA with EVH via both legs    Sleep apnea    uses O2 at night   Tinea    Type 2 diabetes mellitus with hyperglycemia, with long-term current use of insulin ([place]) 04/20/2019   Uncontrolled diabetes mellitus 02/2019   Ventricular tachycardia ([place]) 01/16/15   sustained VT terminated with ATP, CL 340 msec   Vitamin B 12 deficiency 11/05/2018    Vitamin D deficiency 11/05/2018   Past Surgical History:  Procedure Laterality Date   BI-VENTRICULAR IMPLANTABLE CARDIOVERTER DEFIBRILLATOR UPGRADE N/A 09/25/2012   Procedure: BI-VENTRICULAR IMPLANTABLE CARDIOVERTER DEFIBRILLATOR UPGRADE;  Surgeon: Coralyn Mark, MD;  Location: [facility] CATH LAB;  Service: Cardiovascular;  Laterality: N/A;   BREAST BIOPSY Right 04/05/2014   CARDIAC CATHETERIZATION     CARDIAC DEFIBRILLATOR PLACEMENT  4/08   by Dr Leonia Reeves (MDT)   COLONOSCOPY WITH PROPOFOL N/A 10/12/2016   Procedure: COLONOSCOPY WITH PROPOFOL;  Surgeon: Doran Stabler, MD;  Location: [facility] ENDOSCOPY;  Service: Gastroenterology;  Laterality: N/A;   CORONARY ARTERY BYPASS GRAFT  10/05/2005   by Dr Cyndia Bent   IMPLANTABLE CARDIOVERTER DEFIBRILLATOR IMPLANT  09/25/12   attempt of upgrade to CRT-D unsuccessful due to CS anatomy, SJM Unify [place] device placed with LV port capped by Dr Rayann Heman   IR GENERIC HISTORICAL  10/03/2015   IR US GUIDE VASC ACCESS LEFT 10/03/2015 Jacqulynn Cadet, MD [facility]   IR GENERIC HISTORICAL  10/03/2015   IR VENO/EXT/UNI LEFT 10/03/2015 Jacqulynn Cadet, MD [facility]   IR GENERIC HISTORICAL  10/03/2015   IR VENOCAVAGRAM IVC 10/03/2015 Jacqulynn Cadet, MD [facility]   IR GENERIC HISTORICAL  10/03/2015   IR INFUSION THROMBOL VENOUS INITIAL (MS) 10/03/2015 Jacqulynn Cadet, MD [facility]   IR GENERIC HISTORICAL  10/03/2015   IR US GUIDE VASC ACCESS LEFT 10/03/2015 Jacqulynn Cadet, MD [facility]   IR GENERIC HISTORICAL  10/04/2015   IR THROMBECT VENO MECH MOD SED 10/04/2015 Sandi Mariscal, MD [facility]   IR GENERIC HISTORICAL  10/04/2015   IR TRANSCATH PLC STENT 1ST ART NOT LE CV CAR VERT CAR 10/04/2015 Sandi Mariscal, MD [facility]   IR GENERIC HISTORICAL  10/04/2015   IR THROMB F/U EVAL ART/VEN FINAL DAY (MS) 10/04/2015 Sandi Mariscal, MD [facility]   IR GENERIC HISTORICAL  11/02/2015   IR RADIOLOGIST EVAL & MGMT 11/02/2015 Sandi Mariscal, MD [facility]   IR  RADIOLOGIST EVAL & MGMT  04/26/2016   LEFT HEART CATH AND CORS/GRAFTS ANGIOGRAPHY N/A 01/19/2019   Procedure: LEFT HEART CATH AND CORS/GRAFTS ANGIOGRAPHY;  Surgeon: Belva Crome, MD;  Location: [place] CV LAB;  Service: Cardiovascular;  Laterality: N/A;   Social History:  reports that she has never smoked. She has never used smokeless tobacco. She reports that she does not drink alcohol and does not use drugs.  Allergies  Allergen Reactions   Lipitor  [Atorvastatin Calcium] Rash   Metformin And Related Itching, Swelling and Other (See Comments)    Leg pain & swelling in legs   Ozempic (0.25 Or 0.5 Mg-Dose) [Semaglutide(0.25 Or 0.'5mg'$ -Dos)] Other (See Comments)    Pt with hx of pancreatitis    Rosuvastatin    Atorvastatin Itching and Rash   Atorvastatin  Calcium Itching and Rash   Levofloxacin Itching    Family History  Problem Relation Age of Onset   Diabetes Mother 35       died - HTN   Stroke Mother    High blood pressure Mother    Sudden death Mother    Obesity Mother    Other Other        No early family hx of CAD    Prior to Admission medications   Medication Sig Start Date End Date Taking? Authorizing Provider  albuterol (PROVENTIL) (2.5 MG/3ML) 0.083% nebulizer solution Take 2.5 mg by nebulization every 6 (six) hours as needed for wheezing or shortness of breath.  09/30/15   [provider]  Alcohol Swabs (ALCOHOL WIPES) 70 % PADS DropSafe Alcohol Prep Pads  USE WHEN CHECKING BLOOD SUGARS    [provider]  amitriptyline (ELAVIL) 10 MG tablet Take by mouth.    [provider]  apixaban (ELIQUIS) 5 MG TABS tablet Take 1 tablet (5 mg total) by mouth 2 (two) times daily. 04/21/21   Angelia Mould, MD  Baclofen 5 MG TABS Take by mouth.    [provider]  Calcium Carbonate-Vit D-Min (CALCIUM 600+D PLUS MINERALS) 600-400 MG-UNIT TABS Take 1 tablet by mouth daily.    [provider]  carvedilol (COREG) 6.25 MG tablet Take  1 tablet (6.25 mg total) by mouth 2 (two) times daily with a meal. 02/22/19   Eulogio Bear U, DO  ciprofloxacin (CIPRO) 500 MG tablet Take by mouth.    [provider]  clotrimazole-betamethasone (LOTRISONE) cream Apply 1 application twice a day by topical route as directed for 14 days. 05/04/13   [provider]  Continuous Blood Gluc Sensor (DEXCOM G6 SENSOR) MISC 1 Device by Does not apply route as directed. 01/25/21   Shamleffer, Melanie Crazier, MD  Continuous Blood Gluc Transmit (DEXCOM G6 TRANSMITTER) MISC 1 Device by Does not apply route as directed. 02/25/20   Shamleffer, Melanie Crazier, MD  cyanocobalamin (,VITAMIN B-12,) 1000 MCG/ML injection 1,000 mcg every 30 (thirty) days.    [provider]  dapagliflozin propanediol (FARXIGA) 10 MG TABS tablet     [provider]  DROPLET PEN NEEDLES 32G X 4 MM MISC USE IN THE MORNING, AT NOON, IN THE EVENING, AND AT BEDTIME. 03/21/20   Shamleffer, Melanie Crazier, MD  ferrous sulfate 325 (65 FE) MG EC tablet Take 325 mg by mouth daily.     [provider]  fluconazole (DIFLUCAN) 150 MG tablet Take 150 mg by mouth daily.    Everardo Beals, NP  furosemide (LASIX) 40 MG tablet TAKE 1 TABLET EVERY DAY 07/24/21   Jerline Pain, MD  gabapentin (NEURONTIN) 300 MG capsule Take by mouth. 12/23/20   [provider]  isosorbide dinitrate (ISORDIL) 30 MG tablet Take 30 mg by mouth every morning.    Millsaps, Joelene Millin, NP  isosorbide mononitrate (IMDUR) 30 MG 24 hr tablet Take by mouth. 02/14/21   [provider]  latanoprost (XALATAN) 0.005 % ophthalmic solution Administer 1 drop to both eyes nightly.    [provider]  lisinopril (ZESTRIL) 2.5 MG tablet Take 1 tablet by mouth daily. 02/22/21   [provider]  meloxicam (MOBIC) 15 MG tablet Take 1 tablet by mouth daily. 11/26/20   [provider]  methocarbamol (ROBAXIN) 500 MG tablet Take 1 tablet (500 mg total) by mouth  2 (two) times daily as needed for muscle spasms. 02/22/19   Eliseo Squires,  Jessica U, DO  nitroGLYCERIN (NITROSTAT) 0.4 MG SL tablet Place 0.4 mg under the tongue every 5 (five) minutes as needed for chest pain.    [provider]  NOVOLOG FLEXPEN 100 UNIT/ML FlexPen Max daily 30 units per scale Patient taking differently: 6-12 Units daily as needed for high blood sugar. Max daily 30 units per scale. Sliding scale 08/31/20   Shamleffer, Melanie Crazier, MD  potassium chloride SA (KLOR-CON) 20 MEQ tablet Take 20 mEq by mouth daily as needed (for low pottassium). 02/23/20   [provider]  rosuvastatin (CRESTOR) 20 MG tablet Take 1 tablet (20 mg total) by mouth daily at 6 PM. 10/07/15   Minus Liberty, MD  traMADol (ULTRAM) 50 MG tablet Take 50 mg by mouth daily. 11/28/15   [provider]  TRESIBA FLEXTOUCH 100 UNIT/ML FlexTouch Pen INJECT 24 UNITS INTO THE SKIN DAILY. 07/10/21   Shamleffer, Melanie Crazier, MD  Vitamin D, Ergocalciferol, (DRISDOL) 1.25 MG (50000 UT) CAPS capsule Take 1 capsule (50,000 Units total) by mouth every 7 (seven) days. 12/30/18   Georgia Lopes, DO    Physical Exam: Vitals:   08/16/21 0800 08/16/21 0830 08/16/21 0900 08/16/21 0930  BP: 122/64 (!) 105/57 (!) 115/59 (!) 106/57  Pulse: 67 (!) 58 67 67  Resp: 16 (!) 23 (!) 24 (!) 23  Temp:    99.1 F (37.3 C)  TempSrc:    Oral  SpO2: 94% 92% 95% 97%    Constitutional: Elderly female who appears to be ill but no acute distress Eyes: PERRL, lids and conjunctivae normal ENMT: Mucous membranes are moist. Posterior pharynx clear of any exudate or lesions.  Neck: normal, supple, no JVD. Respiratory: Normal respiratory effort without significant wheezes or rhonchi appreciated at this time currently O2 saturations maintained on 1 L of oxygen. Cardiovascular: Regular rate and rhythm, no murmurs / rubs / gallops.  No significant lower extremity edema. 2+ pedal pulses.   Abdomen: no tenderness, no masses  palpated. No hepatosplenomegaly. Bowel sounds positive.  Musculoskeletal: no clubbing / cyanosis. No joint deformity upper and lower extremities. Good ROM, no contractures. Normal muscle tone.  Skin: no rashes, lesions, ulcers. No induration Neurologic: CN 2-12 grossly intact. Sensation intact, DTR normal. Strength 5/5 in all 4.  Psychiatric: Normal judgment and insight. Alert and oriented x 3. Normal mood.    Data Reviewed:  Sinus rhythm at 63 bpm with QTc 508.  Reviewed labs and imaging and pertinent records as noted above in the HPI  Assessment and Plan: Nausea, vomiting, and diarrhea  COVID-19 infection Patient presented with nausea, vomiting, and diarrhea after eating shrimp from [employer] yesterday evening.  Family reported that the patient has been coughing for the last 2 days, but had no known recent sick contacts.  She is not vaccinated against COVID-19 but was found to be positive.  Question possibility of gastroenteritis related to either to the [employer] food eaten last night versus COVID-19 -Admit to a medical telemetry bed -COVID-19 order set -Continuous pulse oximetry with nasal cannula oxygen maintain O2 saturation greater than 92% -Aspiration precautions with elevation of head of bed -Check urinalysis -Check inflammatory markers including D-dimer, BNP, ferritin, fibrinogen, hepatitis B surface antigen, LDH, procalcitonin, troponin -Albuterol inhaler every 6 hours -Molnupiravir -Vitamin C and zinc -Antitussives as needed -Antiemetics as needed  Acute kidney injury superimposed on chronic kidney disease stage IIIa Patient presents with creatinine elevated up to 1.6 with BUN 25.  Creatinine last had been 1.05 on 04/28/2021.  This  is greater than 0.3 increased to suggest acute kidney injury.  Suspect dehydration is likely cause of symptoms as patient had been reporting nausea and vomiting.  Patient had received 1 L bolus and then was placed on a rate of 100 mL/h. -Monitor intake  and output -Check urinalysis -Check urine sodium and urine creatinine -Avoid nephrotoxic agents  (Addendum)UTI Acute.  Urinalysis positive for moderate hemoglobin, small leukocytes, many bacteria, 11-20 WBC cc.  Patient was started on Rocephin IV. -Check Urine culture -Start Rocephin IV  Transient hypotension Acute. Initial blood pressure noted to be as low as 92/50.  Blood pressures improved with IV fluids and currently maintained with maps greater than 65.  Suspect due to dehydration in the setting of nausea, vomiting, diarrhea, and decreased p.o. intake. -Hold home blood pressure regimen  Chronic respiratory with hypoxia Patient was never documented to be hypoxic and appears to have 2 L of nasal cannula oxygen available if needed at home. -Continue nasal cannula oxygen as needed maintain O2 saturation greater than 92% -Consider need of steroids  Combined systolic and diastolic congestive heart failure status post AICD Last EF noted to be around 50% in 01/2019.  Patient does not appear to grossly fluid overloaded at this time.  She had been given a total of 2 L of IV fluids. -Strict I&O's and daily weights -Check BNP  Prolonged QT interval Acute.  QTc 508 on admission. -Correct any electrolyte abnormality -Avoid QT prolonging medication  CAD s/p CABG  Diabetes mellitus type 2 Last available hemoglobin A1c was 8.2 04/28/2021.  Initial glucose 165. -Hypoglycemic protocols -CBGs before every meal and nightly with moderate SSI -Adjust insulin regimen as needed  History of DVT on chronic anticoagulation -Continue Eliquis  Hyperlipidemia -Continue Crestor  Right ovarian cyst /lesion Acute on chronic.  Patient has been being followed by [facility] gynecology last seen in May.  CT scan of the abdomen pelvis noted enlarging right ovarian lesion now 6.9 cm(previously 5.2 cm) last year that is concerning for malignancy. -Have patient follow-up in the outpatient setting with [facility]  gynecology  Elevated AST Chronic.  AST mildly elevated at 53, but seem to be elevated similarly previously in 11/2020. -Continue to monitor  OSA Patient is not on CPAP at night. DVT prophylaxis: Eliquis Advance Care Planning:   Code Status: Full Code   Consults: None  Family Communication: Son updated over the phone  Severity of Illness: The appropriate patient status for this patient is INPATIENT. Inpatient status is judged to be reasonable and necessary in order to provide the required intensity of service to ensure the patient's safety. The patient's presenting symptoms, physical exam findings, and initial radiographic and laboratory data in the context of their chronic comorbidities is felt to place them at high risk for further clinical deterioration. Furthermore, it is not anticipated that the patient will be medically stable for discharge from the hospital within 2 midnights of admission.   * I certify that at the point of admission it is my clinical judgment that the patient will require inpatient hospital care spanning beyond 2 midnights from the point of admission due to high intensity of service, high risk for further deterioration and high frequency of surveillance required.*  Author: Norval Morton, MD 08/16/2021 10:26 AM  For on call review [email or web address].

## 2021-08-16 NOTE — ED Notes (Signed)
Report given and care endorsed Lillia Pauls RN

## 2021-08-16 NOTE — ED Provider Notes (Signed)
[facility] EMERGENCY DEPARTMENT Provider Note  CSN: [id number] Arrival date & time: 08/16/21 0124  Chief Complaint(s) Weakness, Diarrhea, and Emesis  HPI [name] is a 71 y.o. 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  She presents for 2 days of generalized fatigue.  Gradual onset which worsened since yesterday.  Today patient had a few bouts of nonbloody nonbilious emesis and explosive diarrhea.  This was after eating [employer] shrimp.  Family reports that the patient has been coughing for the past 2 days.  More so tonight.  No noted fevers.  No no sick contacts.  Patient denies any chest pain or shortness of breath.  She denied any abdominal pain.  No urinary symptoms.  Family denies any recent antibiotic use. Patient has been compliant with her medications. They do report that she has had decreased hydration.  The history is provided by the patient and a relative.    Past Medical History Past Medical History:  Diagnosis Date   Abnormal LFTs    AICD (automatic cardioverter/defibrillator) present    Angina decubitus ([place]) 11/06/2012   Angina pectoris ([place])    Anxiety    Arthritis    Back pain    Bundle branch block 05/2016   CAD (coronary artery disease) 06/18/2010   CHF (congestive heart failure) (HCC)    Chronic combined systolic and diastolic CHF, NYHA class 3 (HCC)    Chronic combined systolic and diastolic heart failure (HCC)    Chronic systolic dysfunction of left ventricle 09/24/2012   Coronary artery disease    s/p CABG 2007   Depression    Dermal mycosis    Diabetes mellitus    type II   DKA (diabetic ketoacidoses) 01/12/2019   DVT (deep venous thrombosis) ([place]) 09/2015   bilateral    Edema, lower extremity    Glaucoma    Hyperlipidemia    Hypertension    Hypoglycemia 09/09/2012   Insomnia    Insulin dependent diabetes mellitus ([place]) 04/23/2012   Ischemic cardiomyopathy    EF 30-35%, s/p ICD 4/08 by Dr Leonia Reeves   Joint pain    LBBB (left bundle branch block) 09/24/2012   Morbid obesity ([place]) 11/06/2012   Obesity    Oxygen dependent 2 L   Pain in left knee    Peripheral neuropathy    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   LIMA to D2, SVG to ramus intermediate, sequential SVG to OM1-OM2, SVG to RCA with EVH via both legs    Sleep apnea    uses O2 at night   Tinea    Type 2 diabetes mellitus with hyperglycemia, with long-term current use of insulin ([place]) 04/20/2019   Uncontrolled diabetes mellitus 02/2019   Ventricular tachycardia ([place]) 01/16/15   sustained VT terminated with ATP, CL 340 msec   Vitamin B 12 deficiency 11/05/2018   Vitamin D deficiency 11/05/2018   Patient Active Problem List   Diagnosis Date Noted   Preop cardiovascular exam 02/10/2021   Adnexal mass 12/23/2020   Polyneuropathy associated with underlying disease ([place]) 09/01/2020   Acute pansinusitis 03/18/2020   Diabetic peripheral neuropathy ([place]) 03/18/2020   Urine finding 03/18/2020   Aortic atherosclerosis ([place]) 01/13/2020   Diabetes mellitus with coincident hypertension ([place]) 01/13/2020  Type 2 diabetes mellitus with diabetic polyneuropathy, with long-term current use of insulin ([place]) 07/16/2019   Type 2 diabetes mellitus with hyperglycemia, with long-term current use of insulin ([place]) 04/20/2019   Diabetes mellitus ([place]) 04/20/2019   Uncontrolled diabetes mellitus 02/20/2019   Pressure injury of skin 02/20/2019   Demand ischemia (HCC)    Abnormal LFTs    Acute pancreatitis 01/12/2019   DKA (diabetic ketoacidoses) 01/12/2019   Vitamin D deficiency 11/05/2018   Vitamin B 12 deficiency 11/05/2018   Osteoarthritis of knee 07/02/2018   Tinea    Sleep apnea    Oxygen dependent    Obesity    Insomnia     Hyperlipidemia    Dermal mycosis    Depression    Coronary artery disease    Arthritis    Anxiety    ICD (implantable cardioverter-defibrillator) in place    Heme positive stool    Benign neoplasm of transverse colon    Bundle branch block 05/08/2016   Left leg pain    DVT (deep venous thrombosis) ([place]) 09/30/2015   Syncope 03/11/2015   AKI (acute kidney injury) ([place]) 03/11/2015   Hypotension 03/11/2015   Syncope and collapse 03/11/2015   Ventricular tachycardia ([place]) 01/17/2015   Pain in the chest    SOB (shortness of breath)    Thyroid nodule    CAP (community acquired pneumonia)    Chest pain 05/26/2014   Right flank pain 05/26/2014   Right flank discomfort    Chronic combined systolic and diastolic heart failure (HCC)    Angina pectoris ([place])    Morbid obesity ([place]) 11/06/2012   Angina decubitus ([place]) 11/06/2012   LBBB (left bundle branch block) 94/17/4081   Chronic systolic dysfunction of left ventricle 09/24/2012   Hypoglycemia 09/09/2012   HLD (hyperlipidemia) 04/23/2012   Insulin dependent diabetes mellitus ([place]) 04/23/2012   Acute renal failure ([place]) 03/10/2012   Ischemic cardiomyopathy 06/18/2010   CAD (coronary artery disease) 06/18/2010   Hypertension 06/18/2010   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   Home Medication(s) Prior to Admission medications   Medication Sig Start Date End Date Taking? Authorizing Provider  albuterol (PROVENTIL) (2.5 MG/3ML) 0.083% nebulizer solution Take 2.5 mg by nebulization every 6 (six) hours as needed for wheezing or shortness of breath.  09/30/15   [provider]  Alcohol Swabs (ALCOHOL WIPES) 70 % PADS DropSafe Alcohol Prep Pads  USE WHEN CHECKING BLOOD SUGARS    [provider]  amitriptyline (ELAVIL) 10 MG tablet Take by mouth.    [provider]  apixaban (ELIQUIS) 5 MG TABS tablet Take 1 tablet (5 mg total) by mouth 2 (two) times daily. 04/21/21   Angelia Mould, MD  Baclofen 5 MG TABS Take by  mouth.    [provider]  Calcium Carbonate-Vit D-Min (CALCIUM 600+D PLUS MINERALS) 600-400 MG-UNIT TABS Take 1 tablet by mouth daily.    [provider]  carvedilol (COREG) 6.25 MG tablet Take 1 tablet (6.25 mg total) by mouth 2 (two) times daily with a meal. 02/22/19   Eulogio Bear U, DO  ciprofloxacin (CIPRO) 500 MG tablet Take by mouth.    [provider]  clotrimazole-betamethasone (LOTRISONE) cream Apply 1 application twice a day by topical route as directed for 14 days. 05/04/13   [provider]  Continuous Blood Gluc Sensor (DEXCOM G6 SENSOR) MISC 1 Device by Does not apply route as directed. 01/25/21   Shamleffer, Melanie Crazier, MD  Continuous Blood Gluc Transmit (DEXCOM G6  TRANSMITTER) MISC 1 Device by Does not apply route as directed. 02/25/20   Shamleffer, Melanie Crazier, MD  cyanocobalamin (,VITAMIN B-12,) 1000 MCG/ML injection 1,000 mcg every 30 (thirty) days.    [provider]  dapagliflozin propanediol (FARXIGA) 10 MG TABS tablet     [provider]  DROPLET PEN NEEDLES 32G X 4 MM MISC USE IN THE MORNING, AT NOON, IN THE EVENING, AND AT BEDTIME. 03/21/20   Shamleffer, Melanie Crazier, MD  ferrous sulfate 325 (65 FE) MG EC tablet Take 325 mg by mouth daily.     [provider]  fluconazole (DIFLUCAN) 150 MG tablet Take 150 mg by mouth daily.    Everardo Beals, NP  furosemide (LASIX) 40 MG tablet TAKE 1 TABLET EVERY DAY 07/24/21   Jerline Pain, MD  gabapentin (NEURONTIN) 300 MG capsule Take by mouth. 12/23/20   [provider]  isosorbide dinitrate (ISORDIL) 30 MG tablet Take 30 mg by mouth every morning.    Millsaps, Joelene Millin, NP  isosorbide mononitrate (IMDUR) 30 MG 24 hr tablet Take by mouth. 02/14/21   [provider]  latanoprost (XALATAN) 0.005 % ophthalmic solution Administer 1 drop to both eyes nightly.    [provider]  lisinopril (ZESTRIL) 2.5 MG tablet Take 1 tablet by mouth  daily. 02/22/21   [provider]  meloxicam (MOBIC) 15 MG tablet Take 1 tablet by mouth daily. 11/26/20   [provider]  methocarbamol (ROBAXIN) 500 MG tablet Take 1 tablet (500 mg total) by mouth 2 (two) times daily as needed for muscle spasms. 02/22/19   Geradine Girt, DO  nitroGLYCERIN (NITROSTAT) 0.4 MG SL tablet Place 0.4 mg under the tongue every 5 (five) minutes as needed for chest pain.    [provider]  NOVOLOG FLEXPEN 100 UNIT/ML FlexPen Max daily 30 units per scale Patient taking differently: 6-12 Units daily as needed for high blood sugar. Max daily 30 units per scale. Sliding scale 08/31/20   Shamleffer, Melanie Crazier, MD  potassium chloride SA (KLOR-CON) 20 MEQ tablet Take 20 mEq by mouth daily as needed (for low pottassium). 02/23/20   [provider]  rosuvastatin (CRESTOR) 20 MG tablet Take 1 tablet (20 mg total) by mouth daily at 6 PM. 10/07/15   Minus Liberty, MD  traMADol (ULTRAM) 50 MG tablet Take 50 mg by mouth daily. 11/28/15   [provider]  TRESIBA FLEXTOUCH 100 UNIT/ML FlexTouch Pen INJECT 24 UNITS INTO THE SKIN DAILY. 07/10/21   Shamleffer, Melanie Crazier, MD  Vitamin D, Ergocalciferol, (DRISDOL) 1.25 MG (50000 UT) CAPS capsule Take 1 capsule (50,000 Units total) by mouth every 7 (seven) days. 12/30/18   Georgia Lopes, DO                                                                                                                                    Allergies Lipitor  [atorvastatin calcium], Metformin  and related, Ozempic (0.25 or 0.5 mg-dose) [semaglutide(0.25 or 0.'5mg'$ -dos)], Rosuvastatin, Atorvastatin, Atorvastatin calcium, and Levofloxacin  Review of Systems Review of Systems As noted in HPI  Physical Exam Vital Signs  I have reviewed the triage vital signs BP (!) 115/54   Pulse 62   Temp 97.9 F (36.6 C) (Oral)   Resp (!) 21   SpO2 99%   Physical Exam Vitals reviewed.  Constitutional:       General: She is not in acute distress.    Appearance: She is well-developed. She is not diaphoretic.  HENT:     Head: Normocephalic and atraumatic.     Nose: Nose normal.  Eyes:     General: No scleral icterus.       Right eye: No discharge.        Left eye: No discharge.     Conjunctiva/sclera: Conjunctivae normal.     Pupils: Pupils are equal, round, and reactive to light.  Cardiovascular:     Rate and Rhythm: Normal rate and regular rhythm.     Heart sounds: No murmur heard.    No friction rub. No gallop.  Pulmonary:     Effort: Pulmonary effort is normal. No respiratory distress.     Breath sounds: Normal breath sounds. No stridor. No rales.  Abdominal:     General: There is no distension.     Palpations: Abdomen is soft.     Tenderness: There is generalized abdominal tenderness (discomfort).  Musculoskeletal:        General: No tenderness.     Cervical back: Normal range of motion and neck supple.  Skin:    General: Skin is warm and dry.     Findings: No erythema or rash.  Neurological:     Mental Status: She is alert and oriented to person, place, and time.     ED Results and Treatments Labs (all labs ordered are listed, but only abnormal results are displayed) Labs Reviewed  CBC WITH DIFFERENTIAL/PLATELET - Abnormal; Notable for the following components:      Result Value   Hemoglobin 15.2 (*)    HCT 46.7 (*)    RDW 17.2 (*)    Platelets 127 (*)    nRBC 0.9 (*)    All other components within normal limits  COMPREHENSIVE METABOLIC PANEL - Abnormal; Notable for the following components:   CO2 20 (*)    Glucose, Bld 165 (*)    BUN 25 (*)    Creatinine, Ser 1.60 (*)    AST 53 (*)    GFR, Estimated 34 (*)    All other components within normal limits  SARS CORONAVIRUS 2 BY RT PCR  LIPASE, BLOOD  URINALYSIS, ROUTINE W REFLEX MICROSCOPIC                                                                                                                         EKG   EKG Interpretation  Date/Time:    Ventricular Rate:  PR Interval:    QRS Duration:   QT Interval:    QTC Calculation:   R Axis:     Text Interpretation:         Radiology CT ABDOMEN PELVIS WO CONTRAST  Result Date: 08/16/2021 CLINICAL DATA:  71 year old female with sudden onset vomiting and diarrhea. EXAM: CT ABDOMEN AND PELVIS WITHOUT CONTRAST TECHNIQUE: Multidetector CT imaging of the abdomen and pelvis was performed following the standard protocol without IV contrast. RADIATION DOSE REDUCTION: This exam was performed according to the departmental dose-optimization program which includes automated exposure control, adjustment of the mA and/or kV according to patient size and/or use of iterative reconstruction technique. COMPARISON:  Noncontrast CT Abdomen and Pelvis 11/30/2020, 01/12/2019. pelvis ultrasound 01/18/2021. FINDINGS: Lower chest: Chronic cardiomegaly and cardiac pacemaker. No pericardial or pleural effusion. But increased streaky peribronchial lung base opacity since last year. No consolidation. Hepatobiliary: Chronic cholelithiasis. No gallbladder inflammation is evident. Negative noncontrast liver. Pancreas: Negative. Spleen: Negative. Adrenals/Urinary Tract: Stable. Nonobstructed kidneys with nephrolithiasis and renal vascular calcifications. Pelvic phleboliths. Unremarkable bladder. Stomach/Bowel: No dilated large or small bowel. Redundant although mostly decompressed large bowel throughout the abdomen and pelvis. Redundant ascending colon. Small fecalith but otherwise normal appendix (series 3, images 56 and 57). Stomach and duodenum are also fairly decompressed. No free air or free fluid. No convincing mesenteric inflammation. Chronic ventral abdominal muscle diastasis. Vascular/Lymphatic: Severe Aortoiliac calcified atherosclerosis. Normal caliber abdominal aorta. Chronic iliac arterial and venous stents. Vascular patency is not evaluated in the absence of IV contrast. No  lymphadenopathy identified. Reproductive: Slowly enlarging cystic right ovarian lesion, up to 5.2 cm long axis last year and now 6.9 cm (series 3, image 73) with continued internal fluid density although slightly more heterogeneous CT appearance now. Uterus and left ovary remain negative. Other: No pelvic free fluid. Musculoskeletal: Diffuse osteopenia. Chronic L2 through L4 compression fractures appear stable. Other visible vertebrae appear stable and intact. Prior sternotomy. No acute osseous abnormality identified. IMPRESSION: 1. Enlarging cystic right ovarian lesion, characterized by ultrasound in January as suspicious for cystic neoplasm and now 6.9 cm (5.2 cm last year). Recommend GYN Surgery consultation if not already done. 2. No other acute or inflammatory process identified in the non-contrast abdomen or pelvis. 3. Cholelithiasis. Nephrolithiasis. Advanced Aortic Atherosclerosis (ICD10-I70.0). Cardiomegaly. Osteopenia with chronic L2 through L4 compression fractures. Electronically Signed   By: Genevie Ann M.D.   On: 08/16/2021 06:16   DG Chest Portable 1 View  Result Date: 08/16/2021 CLINICAL DATA:  Shortness of breath and weakness. EXAM: PORTABLE CHEST 1 VIEW COMPARISON:  01/12/2019 FINDINGS: Low lung volumes. Patient is rotated. Prior median sternotomy. Left-sided pacemaker in place. Chronic cardiomegaly. Bronchovascular crowding with likely vascular congestion. Suspected small pleural effusions. Bibasilar atelectasis. No pneumothorax. IMPRESSION: Low lung volumes with bronchovascular crowding and likely vascular congestion. Suspected small pleural effusions. Findings may be due to CHF. Electronically Signed   By: Keith Rake M.D.   On: 08/16/2021 02:44    Medications Ordered in ED Medications  sodium chloride 0.9 % bolus 1,000 mL (0 mLs Intravenous Stopped 08/16/21 0417)  0.9 %  sodium chloride infusion ( Intravenous New Bag/Given 08/16/21 0415)  Procedures .1-3 Lead EKG Interpretation  Performed by: Fatima Blank, MD Authorized by: Fatima Blank, MD     Interpretation: normal     ECG rate:  68   ECG rate assessment: normal     Rhythm: sinus rhythm     Ectopy: none     Conduction: normal     (including critical care time)  Medical Decision Making / ED Course   Medical Decision Making Amount and/or Complexity of Data Reviewed Independent Historian: caregiver External Data Reviewed: radiology. Labs: ordered. Decision-making details documented in ED Course. Radiology: ordered and independent interpretation performed. Decision-making details documented in ED Course.  Risk Prescription drug management. Decision regarding hospitalization.    Patient presents with generalized fatigue, cough, nausea/vomiting/diarrhea, noted to have mild abdominal discomfort on exam.  She was noted to be hypotensive in triage with MAP of 62.  She received 1 L of IV fluid prior to my evaluation.  Current blood pressure with systolics in the 093A slightly improved from earlier.  Will assess for evidence of intra-abdominal inflammatory/infectious process, electrolyte/metabolic derangements, severe anemia.  Patient is obese but I do not see evidence of volume overload on exam, that would be concerning for heart failure.  Will expand workup if the above are reassuring.  Laboratory Tests ordered listed below with my independent interpretation: CBC without leukocytosis.  Hemoglobin and hematocrit consistent with hemoconcentration Metabolic panel without significant electrolyte derangements, patient does have evidence of AKI.  She is hyperglycemic without evidence of DKA.  No evidence of bili obstruction or pancreatitis. UA pending COVID pending  Imaging Studies ordered listed below with my independent interpretation: Chest x-ray without evidence of  pneumonia, pneumothorax, pulmonary edema.  She does have mild pleural effusion CT of the abdomen and pelvis with no obvious intra-abdominal inflammatory/infectious process.  Patient does have evidence of cholelithiasis and nephrolithiasis.  This was confirmed by radiology who also noted patient has a adnexal cyst that was previously noted on ultrasound concerning for neoplasm.  Gynecology is currently aware of the mass and following in clinic, Last seen in May.   Patient will require admission for continued gentle hydration.  Will consult medicine for admission. Dr. Tamala Julian agreed to admit for further management        Final Clinical Impression(s) / ED Diagnoses Final diagnoses:  Dehydration  AKI (acute kidney injury) [facility])           This chart was dictated using voice recognition software.  Despite best efforts to proofread,  errors can occur which can change the documentation meaning.    Fatima Blank, MD 08/16/21 [phone]

## 2021-08-16 NOTE — ED Notes (Signed)
On room air SPO2 was 89%. RN aware, placed on 3L and back up to 97%. RN also aware of pts BP.

## 2021-08-16 NOTE — ED Notes (Addendum)
Pt son Alanozo at bedside. Pt requesting to Call with updates. [phone].

## 2021-08-16 NOTE — Care Management Obs Status (Signed)
[place] NOTIFICATION   Patient Details  Name: [name] MRN: [id number] Date of Birth: 1950-12-21   Medicare Observation Status Notification Given:  Yes    Satoya Feeley C Tarpley-Carter, LCSWA 08/16/2021, 3:56 PM

## 2021-08-16 NOTE — ED Notes (Signed)
Pt transferred from recliner to bed with total assistance x4. Pt unable to help during transfer.

## 2021-08-16 NOTE — Progress Notes (Signed)
NEW ADMISSION NOTE New Admission Note:   Arrival Method:  Stretcher Mental Orientation:  A&O x 4 Telemetry: box 11 Assessment: Completed Skin: see assement IV: left hand flushed Pain:deniws Tubes: putwick Safety Measures: Safety Fall Prevention Plan has been given, discussed and signed Admission: Completed [facility] [place] Orientation: Patient has been orientated to the room, unit and staff.  Family: not present  Orders have been reviewed and implemented. Will continue to monitor the patient. Call light has been placed within reach and bed alarm has been activated.   Berneta Levins, RN

## 2021-08-16 NOTE — ED Provider Triage Note (Signed)
Emergency Medicine Provider Triage Evaluation Note  [name] , a 71 y.o. female  was evaluated in triage.  Pt complains of vomiting and diarrhea which began acutely this evening after eating shrimp from [employer] at 2030. No c/o abdominal pain.   Sats of 96% en route with EMS, now with SpO2 of 89% on RA in triage. Does endorse some mild SOB. States she is supposed to use supplemental O2 at night, but is noncompliant.  Review of Systems  Positive: As above Negative: As above  Physical Exam  BP (!) 92/50 (BP Location: Right Arm)   Pulse 65   Temp 98.4 F (36.9 C) (Oral)   Resp 16   SpO2 97%  Gen:   Awake, no distress   Resp:  Normal effort. Sats 97% on 3L via [place] MSK:   Moves extremities without difficulty  Other:  Emesis on clothes. Abdomen obese, nontender.  Medical Decision Making  Medically screening exam initiated at 1:36 AM.  Appropriate orders placed.  [name] was informed that the remainder of the evaluation will be completed by another provider, this initial triage assessment does not replace that evaluation, and the importance of remaining in the ED until their evaluation is complete.  V/D - pending labs. Abdominal exam benign in triage. Hypoxic - chart review suggests OSA. She is satting 97% on 3L via [place] in triage IVF initiated for hypotension.   Antonietta Breach, PA-C 08/16/21 [id number]

## 2021-08-16 NOTE — Care Management CC44 (Signed)
Condition Code 44 Documentation Completed  Patient Details  Name: [name] MRN: [id number] Date of Birth: 1950-06-17   Condition Code 44 given:  Yes (Face to Face) Patient signature on Condition Code 44 notice:  Yes Documentation of 2 MD's agreement:  Yes Code 44 added to claim:  Yes    [name], LCSWA 08/16/2021, 3:56 PM

## 2021-08-16 NOTE — ED Notes (Signed)
Admitting MD at BS.  

## 2021-08-16 NOTE — ED Triage Notes (Signed)
Pt via [facility] for eval of sudden-onset vomiting, explosive diarrhea after eating [employer] shrimp. Pt A&O4, denies abd pain, CP.  CBG 218 110/68 HR 78  Pt hypotensive in triage, charge RN notified. NS bolus started

## 2021-08-16 NOTE — ED Notes (Signed)
Patient transported to CT 

## 2021-08-17 DIAGNOSIS — R112 Nausea with vomiting, unspecified: Secondary | ICD-10-CM | POA: Diagnosis not present

## 2021-08-17 DIAGNOSIS — N179 Acute kidney failure, unspecified: Secondary | ICD-10-CM | POA: Diagnosis not present

## 2021-08-17 DIAGNOSIS — U071 COVID-19: Secondary | ICD-10-CM | POA: Diagnosis not present

## 2021-08-17 DIAGNOSIS — R197 Diarrhea, unspecified: Secondary | ICD-10-CM | POA: Diagnosis not present

## 2021-08-17 LAB — CBC WITH DIFFERENTIAL/PLATELET
Abs Immature Granulocytes: 0.02 10*3/uL (ref 0.00–0.07)
Basophils Absolute: 0 10*3/uL (ref 0.0–0.1)
Basophils Relative: 0 %
Eosinophils Absolute: 0 10*3/uL (ref 0.0–0.5)
Eosinophils Relative: 0 %
HCT: 41.2 % (ref 36.0–46.0)
Hemoglobin: 13.6 g/dL (ref 12.0–15.0)
Immature Granulocytes: 1 %
Lymphocytes Relative: 44 %
Lymphs Abs: 1.9 10*3/uL (ref 0.7–4.0)
MCH: 32.5 pg (ref 26.0–34.0)
MCHC: 33 g/dL (ref 30.0–36.0)
MCV: 98.6 fL (ref 80.0–100.0)
Monocytes Absolute: 0.4 10*3/uL (ref 0.1–1.0)
Monocytes Relative: 10 %
Neutro Abs: 1.9 10*3/uL (ref 1.7–7.7)
Neutrophils Relative %: 45 %
Platelets: 94 10*3/uL — ABNORMAL LOW (ref 150–400)
RBC: 4.18 MIL/uL (ref 3.87–5.11)
RDW: 17.5 % — ABNORMAL HIGH (ref 11.5–15.5)
WBC: 4.2 10*3/uL (ref 4.0–10.5)
nRBC: 0 % (ref 0.0–0.2)

## 2021-08-17 LAB — MAGNESIUM: Magnesium: 1.8 mg/dL (ref 1.7–2.4)

## 2021-08-17 LAB — GLUCOSE, CAPILLARY
Glucose-Capillary: 116 mg/dL — ABNORMAL HIGH (ref 70–99)
Glucose-Capillary: 120 mg/dL — ABNORMAL HIGH (ref 70–99)
Glucose-Capillary: 185 mg/dL — ABNORMAL HIGH (ref 70–99)
Glucose-Capillary: 206 mg/dL — ABNORMAL HIGH (ref 70–99)

## 2021-08-17 LAB — C-REACTIVE PROTEIN: CRP: 1.7 mg/dL — ABNORMAL HIGH (ref <1.0)

## 2021-08-17 LAB — PHOSPHORUS: Phosphorus: 2.8 mg/dL (ref 2.5–4.6)

## 2021-08-17 MED ORDER — ALBUTEROL SULFATE HFA 108 (90 BASE) MCG/ACT IN AERS: 2.0000 | INHALATION_SPRAY | Freq: Four times a day (QID) | RESPIRATORY_TRACT | Status: DC | PRN

## 2021-08-17 NOTE — Evaluation (Signed)
Physical Therapy Evaluation Patient Details Name: [name] MRN: [id number] DOB: May 05, 1950 Today's Date: 08/17/2021  History of Present Illness  71 y.o. female presents to [facility] hospital on 08/16/2021 with nausea, vomiting, and diarrhea after consuming [employer]. Pt found to be COVID+. CT scan of the abdomen and pelvis noted enlarging cystic right ovarian lesion now 6.9 cm. Urinalysis suspicious for UTI. PMH includes CHF, CABG, DMII, chronic respiratory failure on 2L O2, DVT, anxiety, depression, OSA.  Clinical Impression  Pt presents to PT with deficits in strength, power, endurance, functional mobility, gait. Pt is generally weak and requires some assistance to mobilize in the bed. Once standing pt is able to tolerate limited household distances with a walker to improve stability. Pt will benefit from aggressive mobilization in an effort to improve strength and activity tolerance. PT recommends discharge home with HHPT when medically ready. Pt will benefit from assessment of gait with a 4 wheeled walker next session in an effort to assess stability with the device the pt typically utilizes.     Recommendations for follow up therapy are one component of a multi-disciplinary discharge planning process, led by the attending physician.  Recommendations may be updated based on patient status, additional functional criteria and insurance authorization.  Follow Up Recommendations Home health PT      Assistance Recommended at Discharge Intermittent Supervision/Assistance  Patient can return home with the following  A little help with walking and/or transfers;A little help with bathing/dressing/bathroom;Assistance with cooking/housework;Assist for transportation    Equipment Recommendations None recommended by PT  Recommendations for Other Services       Functional Status Assessment Patient has had a recent decline in their functional status and demonstrates the ability to make significant  improvements in function in a reasonable and predictable amount of time.     Precautions / Restrictions Precautions Precautions: Fall Restrictions Weight Bearing Restrictions: No      Mobility  Bed Mobility Overal bed mobility: Needs Assistance Bed Mobility: Supine to Sit     Supine to sit: Min assist     General bed mobility comments: use of bed rails, verbal cues for sequencing. Assist to pivot hips with bed pad    Transfers Overall transfer level: Needs assistance Equipment used:  (bari RW) Transfers: Sit to/from Stand, Bed to chair/wheelchair/BSC Sit to Stand: Min assist   Step pivot transfers: Min guard       General transfer comment: rocking for momentum    Ambulation/Gait Ambulation/Gait assistance: Min guard Gait Distance (Feet): 20 Feet Assistive device:  (bari RW) Gait Pattern/deviations: Step-through pattern, Wide base of support Gait velocity: reduced Gait velocity interpretation: <1.31 ft/sec, indicative of household ambulator   General Gait Details: pt with slowed step-through gait, widened BOS  Stairs            Wheelchair Mobility    Modified Rankin (Stroke Patients Only)       Balance Overall balance assessment: Needs assistance Sitting-balance support: No upper extremity supported, Feet supported Sitting balance-Leahy Scale: Good     Standing balance support: Single extremity supported, Bilateral upper extremity supported, Reliant on assistive device for balance Standing balance-Leahy Scale: Poor                               Pertinent Vitals/Pain Pain Assessment Pain Assessment: 0-10 Pain Score: 10-Worst pain ever Pain Location: L knee Pain Descriptors / Indicators: Aching Pain Intervention(s): Monitored during session  Home Living Family/patient expects to be discharged to:: Private residence Living Arrangements: Children Available Help at Discharge: Family;Available PRN/intermittently;Other (Comment)  (Caring hands 2.5 hours daily) Type of Home: House Home Access: Ramped entrance       Home Layout: One level Home Equipment: Rollator (4 wheels);Other (comment);Shower seat (oxygen)      Prior Function Prior Level of Function : Needs assist             Mobility Comments: pt ambulates household distances with rollator ADLs Comments: minA for bathing, caring hands does cooking and cleaning     Hand Dominance   Dominant Hand: Right    Extremity/Trunk Assessment   Upper Extremity Assessment Upper Extremity Assessment: Generalized weakness    Lower Extremity Assessment Lower Extremity Assessment: Generalized weakness    Cervical / Trunk Assessment Cervical / Trunk Assessment: Normal  Communication   Communication: No difficulties  Cognition Arousal/Alertness: Awake/alert Behavior During Therapy: WFL for tasks assessed/performed Overall Cognitive Status: Within Functional Limits for tasks assessed                                          General Comments General comments (skin integrity, edema, etc.): VSS, pt on 2L [place] during session    Exercises     Assessment/Plan    PT Assessment Patient needs continued PT services  PT Problem List Decreased strength;Decreased activity tolerance;Decreased balance;Decreased mobility       PT Treatment Interventions DME instruction;Gait training;Functional mobility training;Therapeutic activities;Therapeutic exercise;Balance training;Neuromuscular re-education;Patient/family education    PT Goals (Current goals can be found in the Care Plan section)  Acute Rehab PT Goals Patient Stated Goal: to return to prior level of function, regain strength PT Goal Formulation: With patient Time For Goal Achievement: 08/31/21 Potential to Achieve Goals: Good    Frequency Min 3X/week     Co-evaluation               AM-PAC PT "6 Clicks" Mobility  Outcome Measure Help needed turning from your back to your  side while in a flat bed without using bedrails?: A Little Help needed moving from lying on your back to sitting on the side of a flat bed without using bedrails?: A Little Help needed moving to and from a bed to a chair (including a wheelchair)?: A Little Help needed standing up from a chair using your arms (e.g., wheelchair or bedside chair)?: A Little Help needed to walk in hospital room?: A Little Help needed climbing 3-5 steps with a railing? : Total 6 Click Score: 16    End of Session Equipment Utilized During Treatment: Oxygen Activity Tolerance: Patient tolerated treatment well Patient left: in chair;with call bell/phone within reach;with chair alarm set Nurse Communication: Mobility status PT Visit Diagnosis: Other abnormalities of gait and mobility (R26.89);Muscle weakness (generalized) (M62.81)    Time: 2595-6387 PT Time Calculation (min) (ACUTE ONLY): 26 min   Charges:   PT Evaluation $PT Eval Low Complexity: [place], PT, DPT Acute Rehabilitation Office [phone]   Zenaida Niece 08/17/2021, 4:41 PM

## 2021-08-17 NOTE — Progress Notes (Signed)
Progress Note   Patient: [name] NID:[id number] DOB: 01-Dec-1950 DOA: 08/16/2021     1 DOS: the patient was seen and examined on 08/17/2021   Brief hospital course: 71 y.o. female with medical history significant of combined CHF, s/p AICD, CAD s/p CABG, DM type II, chronic respiratory failure on 2 L of oxygen, history of DVT on chronic anticoagulation, anxiety, depression, and OSA who presents with complaints of nausea, vomiting, and diarrhea which started yesterday evening after she had at [employer] shrimp.  Emesis and diarrhea were noted to be nonbloody.'s.  Nobody else in the household had different.  However she had not been feeling well over the last couple of days and complained of fatigue, weakness, and intermittent productive cough.  Her son noticed that the patient has oxygen at home, but does not wear it all the time.  Patient denied having any significant fever, recent sick contacts to her knowledge, chest pain, shortness of breath, abdominal pain, or dysuria.  She has not recently been on antibiotics, but did admit to decreased p.o. intake.  Patient never received any of the COVID-19 vaccinations.  Her son notes that the patient has been following at [facility] for a lesion on her right ovary, but due to transportation issues had been having difficulty following back up.   On admission to the emergency department patient was seen with temperature up to 99.1 F with a pulse 58-67, respirations 16-24, blood pressure 92/52-122/64, and O2 saturations currently maintained on 1-3 L of nasal cannula oxygen.  Labs significant for hemoglobin 13.2, platelet count 127, BUN 25, creatinine 1.60, and glucose 165.  Chest x-ray noted low lung volumes and likely vascular congestion with suspected small pleural effusions possibly related with CHF.  CT scan of the abdomen and pelvis noted enlarging cystic right ovarian lesion now 6.9 cm.  COVID-19 screening positive.  Assessment and Plan: Nausea, vomiting,  and diarrhea  COVID-19 infection Patient presented with nausea, vomiting, and diarrhea after eating shrimp from [employer] evening prior to presentation.  Family reported that the patient has been coughing for the last 2 days, but had no known recent sick contacts.  She is not vaccinated against COVID-19 but was found to be positive.  Question possibility of gastroenteritis related to either to the [employer] food eaten last night versus COVID-19 -Albuterol inhaler every 6 hours -cont Molnupiravir -Vitamin C and zinc -Antitussives as needed -Antiemetics as needed -Pt reports feeling better today, however states regular diet "tears my stomach up" and is requesting downgrading diet to full liquids   Acute kidney injury superimposed on chronic kidney disease stage IIIa Patient presents with creatinine elevated up to 1.6 with BUN 25.  Creatinine last had been 1.05 on 04/28/2021.  This is greater than 0.3 increased to suggest acute kidney injury.  Suspect dehydration is likely cause of symptoms as patient had been reporting nausea and vomiting.  Given IVF overnight -Monitor intake and output -Avoid nephrotoxic agents   (Addendum)UTI Acute.  Urinalysis positive for moderate hemoglobin, small leukocytes, many bacteria, 11-20 WBC cc.   -Urine culture pending -Continued on empiric rocephin   Transient hypotension Acute. Initial blood pressure noted to be as low as 92/50.  Blood pressures improved with IV fluids and currently maintained with maps greater than 65.  Suspect due to dehydration in the setting of nausea, vomiting, diarrhea, and decreased p.o. intake. -Hold home blood pressure regimen and encourage PO -BP improving   Chronic respiratory with hypoxia Patient was never documented to be  hypoxic and appears to have 2 L of nasal cannula oxygen available if needed at home. 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  Patient does not appear to grossly fluid overloaded at this time.  She had been given a total of 2 L of IV fluids. -Strict I&O's and daily weights\   Prolonged QT interval Acute.  QTc 508 on admission. -Correct any electrolyte abnormality -Avoid QT prolonging medication   CAD s/p CABG   Diabetes mellitus type 2 Last available hemoglobin A1c was 8.2 04/28/2021.  Initial glucose 165. -Hypoglycemic protocols -CBGs before every meal and nightly with moderate SSI -Adjust insulin regimen as needed   History of DVT on chronic anticoagulation -Continue Eliquis   Hyperlipidemia -Continue Crestor   Right ovarian cyst /lesion Acute on chronic.  Patient has been being followed by [facility] gynecology last seen in May.  CT scan of the abdomen pelvis noted enlarging right ovarian lesion now 6.9 cm(previously 5.2 cm) last year that is concerning for malignancy. -Have patient follow-up in the outpatient setting with [facility] gynecology   Elevated AST Chronic.  AST mildly elevated at 53, but seem to be elevated similarly previously in 11/2020. -Continue to monitor   OSA Patient is not on CPAP at night.     Subjective: Reports feeling better today, however also mentioned trial of regular diet "tears my stomach up"  Physical Exam: Vitals:   08/17/21 0142 08/17/21 0557 08/17/21 0910 08/17/21 0921  BP:  (!) 161/59  136/61  Pulse: 65 60  64  Resp: '18 18  18  '$ Temp:  99.1 F (37.3 C)  98.4 F (36.9 C)  TempSrc:  Oral    SpO2: 100% 99% 100% 96%  Weight:      Height:       General exam: Awake, laying in bed, in nad Respiratory system: Normal respiratory effort, no wheezing Cardiovascular system: regular rate, s1, s2 Gastrointestinal system: Soft, nondistended, positive BS Central nervous system: CN2-12 grossly intact, strength intact Extremities: Perfused, no clubbing Skin: Normal skin turgor, no notable skin lesions seen Psychiatry: Mood normal // no visual hallucinations   Data  Reviewed:  Labs reviewed: CRP 1.7, WBC 4.2, Hgb 13.6, Plts 94  Family Communication: Pt in room, family not at bedside  Disposition: Status is: Observation The patient remains OBS appropriate and will d/c before 2 midnights.  Planned Discharge Destination: Home     Author: Marylu Lund, MD 08/17/2021 4:26 PM  For on call review [email or web address].

## 2021-08-17 NOTE — TOC Progression Note (Signed)
Transition of Care [facility]) - Initial/Assessment Note    Patient Details  Name: [name] MRN: [id number] Date of Birth: 01/22/1950  Transition of Care [facility]) CM/SW Contact:    Milinda Antis, [place] Phone Number: 08/17/2021, 10:26 AM  Clinical Narrative:                 Patient is from home, COVID+, and symptomatic.    TOC following patient for any d/c planning needs once medically stable.  Lind Covert, MSW, LCSWA         Patient Goals and CMS Choice        Expected Discharge Plan and Services                                                Prior Living Arrangements/Services                       Activities of Daily Living Home Assistive Devices/Equipment: Walker (specify type), Cane (specify quad or straight), Grab bars in shower ADL Screening (condition at time of admission) Patient's cognitive ability adequate to safely complete daily activities?: Yes Is the patient deaf or have difficulty hearing?: No Does the patient have difficulty seeing, even when wearing glasses/contacts?: No Does the patient have difficulty concentrating, remembering, or making decisions?: No Patient able to express need for assistance with ADLs?: Yes Does the patient have difficulty dressing or bathing?: No Independently performs ADLs?: Yes (appropriate for developmental age) Does the patient have difficulty walking or climbing stairs?: Yes Weakness of Legs: Both Weakness of Arms/Hands: Both  Permission Sought/Granted                  Emotional Assessment              Admission diagnosis:  Dehydration [E86.0] Gastroenteritis [K52.9] AKI (acute kidney injury) ([place]) [N17.9] Nausea, vomiting, and diarrhea [R11.2, R19.7] Hypotension due to hypovolemia [I95.89, E86.1] Patient Active Problem List   Diagnosis Date Noted   Nausea, vomiting, and diarrhea 08/16/2021   COVID-19 virus infection 08/16/2021   History of DVT (deep vein thrombosis)  08/16/2021   Prolonged QT interval 08/16/2021   Right ovarian cyst 08/16/2021   Elevated AST (SGOT) 08/16/2021   Preop cardiovascular exam 02/10/2021   Adnexal mass 12/23/2020   Polyneuropathy associated with underlying disease ([place]) 09/01/2020   Acute pansinusitis 03/18/2020   Diabetic peripheral neuropathy ([place]) 03/18/2020   Urine finding 03/18/2020   Aortic atherosclerosis ([place]) 01/13/2020   Diabetes mellitus with coincident hypertension ([place]) 01/13/2020   Type 2 diabetes mellitus with diabetic polyneuropathy, with long-term current use of insulin ([place]) 07/16/2019   Type 2 diabetes mellitus with hyperglycemia, with long-term current use of insulin ([place]) 04/20/2019   Diabetes mellitus ([place]) 04/20/2019   Uncontrolled diabetes mellitus 02/20/2019   Pressure injury of skin 02/20/2019   Demand ischemia ([place])    Abnormal LFTs    Acute pancreatitis 01/12/2019   DKA (diabetic ketoacidoses) 01/12/2019   Vitamin D deficiency 11/05/2018   Vitamin B 12 deficiency 11/05/2018   Osteoarthritis of knee 07/02/2018   Tinea    Sleep apnea    Oxygen dependent    Obesity    Insomnia    Hyperlipidemia    Dermal mycosis    Depression    Coronary artery disease    Arthritis    Anxiety  ICD (implantable cardioverter-defibrillator) in place    Heme positive stool    Benign neoplasm of transverse colon    Bundle branch block 05/08/2016   Left leg pain    DVT (deep venous thrombosis) (HCC) 09/30/2015   Syncope 03/11/2015   AKI (acute kidney injury) ([place]) 03/11/2015   Hypotension 03/11/2015   Syncope and collapse 03/11/2015   Ventricular tachycardia ([place]) 01/17/2015   Pain in the chest    SOB (shortness of breath)    Thyroid nodule    CAP (community acquired pneumonia)    Chest pain 05/26/2014   Right flank pain 05/26/2014   Right flank discomfort    Chronic combined systolic and diastolic heart failure (HCC)    Angina pectoris ([place])    Morbid obesity ([place]) 11/06/2012   Angina  decubitus ([place]) 11/06/2012   LBBB (left bundle branch block) 45/80/9983   Chronic systolic dysfunction of left ventricle 09/24/2012   Hypoglycemia 09/09/2012   HLD (hyperlipidemia) 04/23/2012   Insulin dependent diabetes mellitus ([place]) 04/23/2012   Acute kidney injury superimposed on chronic kidney disease ([place]) 03/10/2012   Ischemic cardiomyopathy 06/18/2010   CAD (coronary artery disease) 06/18/2010   Hypertension 06/18/2010   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   PCP:  Everardo Beals, NP Pharmacy:   [place] ([place] - [address] [place] [address] Phone: [phone] Fax: [phone]  [place], [place] - [address], [place] [address], [place] [address] [place] [address] Phone: [phone] Fax: [phone]  [facility] - [place], [place] [address] Phone: [phone] Fax: [phone]     Social Determinants of Health (SDOH) Interventions    Readmission Risk Interventions     No data to display

## 2021-08-17 NOTE — Hospital Course (Signed)
71 y.o. female with medical history significant of combined CHF, s/p AICD, CAD s/p CABG, DM type II, chronic respiratory failure on 2 L of oxygen, history of DVT on chronic anticoagulation, anxiety, depression, and OSA who presents with complaints of nausea, vomiting, and diarrhea which started yesterday evening after she had at [employer].  Emesis and diarrhea were noted to be nonbloody.'s.  Nobody else in the household had different.  However she had not been feeling well over the last couple of days and complained of fatigue, weakness, and intermittent productive cough.  Her son noticed that the patient has oxygen at home, but does not wear it all the time.  Patient denied having any significant fever, recent sick contacts to her knowledge, chest pain, shortness of breath, abdominal pain, or dysuria.  She has not recently been on antibiotics, but did admit to decreased p.o. intake.  Patient never received any of the COVID-19 vaccinations.  Her son notes that the patient has been following at [facility] for a lesion on her right ovary, but due to transportation issues had been having difficulty following back up.   On admission to the emergency department patient was seen with temperature up to 99.1 F with a pulse 58-67, respirations 16-24, blood pressure 92/52-122/64, and O2 saturations currently maintained on 1-3 L of nasal cannula oxygen.  Labs significant for hemoglobin 13.2, platelet count 127, BUN 25, creatinine 1.60, and glucose 165.  Chest x-ray noted low lung volumes and likely vascular congestion with suspected small pleural effusions possibly related with CHF.  CT scan of the abdomen and pelvis noted enlarging cystic right ovarian lesion now 6.9 cm.  COVID-19 screening positive.

## 2021-08-17 NOTE — Plan of Care (Signed)
  Problem: Education: Goal: Knowledge of risk factors and measures for prevention of condition will improve Outcome: Progressing   Problem: Coping: Goal: Psychosocial and spiritual needs will be supported Outcome: Progressing   Problem: Respiratory: Goal: Will maintain a patent airway Outcome: Progressing Goal: Complications related to the disease process, condition or treatment will be avoided or minimized Outcome: Progressing   Problem: Education: Goal: Ability to describe self-care measures that may prevent or decrease complications (Diabetes Survival Skills Education) will improve Outcome: Progressing Goal: Individualized Educational Video(s) Outcome: Progressing   Problem: Coping: Goal: Ability to adjust to condition or change in health will improve Outcome: Progressing   Problem: Fluid Volume: Goal: Ability to maintain a balanced intake and output will improve Outcome: Progressing   Problem: Health Behavior/Discharge Planning: Goal: Ability to identify and utilize available resources and services will improve Outcome: Progressing Goal: Ability to manage health-related needs will improve Outcome: Progressing   Problem: Metabolic: Goal: Ability to maintain appropriate glucose levels will improve Outcome: Progressing   Problem: Nutritional: Goal: Maintenance of adequate nutrition will improve Outcome: Progressing Goal: Progress toward achieving an optimal weight will improve Outcome: Progressing   Problem: Skin Integrity: Goal: Risk for impaired skin integrity will decrease Outcome: Progressing   Problem: Tissue Perfusion: Goal: Adequacy of tissue perfusion will improve Outcome: Progressing   

## 2021-08-18 DIAGNOSIS — R112 Nausea with vomiting, unspecified: Secondary | ICD-10-CM | POA: Diagnosis not present

## 2021-08-18 DIAGNOSIS — U071 COVID-19: Secondary | ICD-10-CM | POA: Diagnosis not present

## 2021-08-18 DIAGNOSIS — N179 Acute kidney failure, unspecified: Secondary | ICD-10-CM | POA: Diagnosis not present

## 2021-08-18 DIAGNOSIS — R197 Diarrhea, unspecified: Secondary | ICD-10-CM | POA: Diagnosis not present

## 2021-08-18 LAB — CBC WITH DIFFERENTIAL/PLATELET
Abs Immature Granulocytes: 0.01 10*3/uL (ref 0.00–0.07)
Basophils Absolute: 0 10*3/uL (ref 0.0–0.1)
Basophils Relative: 0 %
Eosinophils Absolute: 0 10*3/uL (ref 0.0–0.5)
Eosinophils Relative: 1 %
HCT: 40.5 % (ref 36.0–46.0)
Hemoglobin: 13.6 g/dL (ref 12.0–15.0)
Immature Granulocytes: 0 %
Lymphocytes Relative: 49 %
Lymphs Abs: 1.5 10*3/uL (ref 0.7–4.0)
MCH: 32.9 pg (ref 26.0–34.0)
MCHC: 33.6 g/dL (ref 30.0–36.0)
MCV: 97.8 fL (ref 80.0–100.0)
Monocytes Absolute: 0.4 10*3/uL (ref 0.1–1.0)
Monocytes Relative: 12 %
Neutro Abs: 1.2 10*3/uL — ABNORMAL LOW (ref 1.7–7.7)
Neutrophils Relative %: 38 %
Platelets: 86 10*3/uL — ABNORMAL LOW (ref 150–400)
RBC: 4.14 MIL/uL (ref 3.87–5.11)
RDW: 17.2 % — ABNORMAL HIGH (ref 11.5–15.5)
WBC: 3.1 10*3/uL — ABNORMAL LOW (ref 4.0–10.5)
nRBC: 0.6 % — ABNORMAL HIGH (ref 0.0–0.2)

## 2021-08-18 LAB — COMPREHENSIVE METABOLIC PANEL
ALT: 30 U/L (ref 0–44)
AST: 35 U/L (ref 15–41)
Albumin: 2.8 g/dL — ABNORMAL LOW (ref 3.5–5.0)
Alkaline Phosphatase: 62 U/L (ref 38–126)
Anion gap: 7 (ref 5–15)
BUN: 9 mg/dL (ref 8–23)
CO2: 22 mmol/L (ref 22–32)
Calcium: 8.9 mg/dL (ref 8.9–10.3)
Chloride: 107 mmol/L (ref 98–111)
Creatinine, Ser: 0.93 mg/dL (ref 0.44–1.00)
GFR, Estimated: >60 mL/min (ref >60)
Glucose, Bld: 154 mg/dL — ABNORMAL HIGH (ref 70–99)
Potassium: 4.1 mmol/L (ref 3.5–5.1)
Sodium: 136 mmol/L (ref 135–145)
Total Bilirubin: 0.8 mg/dL (ref 0.3–1.2)
Total Protein: 6.4 g/dL — ABNORMAL LOW (ref 6.5–8.1)

## 2021-08-18 LAB — GLUCOSE, CAPILLARY
Glucose-Capillary: 157 mg/dL — ABNORMAL HIGH (ref 70–99)
Glucose-Capillary: 192 mg/dL — ABNORMAL HIGH (ref 70–99)
Glucose-Capillary: 223 mg/dL — ABNORMAL HIGH (ref 70–99)
Glucose-Capillary: 244 mg/dL — ABNORMAL HIGH (ref 70–99)

## 2021-08-18 LAB — URINE CULTURE

## 2021-08-18 LAB — C-REACTIVE PROTEIN: CRP: 1.6 mg/dL — ABNORMAL HIGH (ref <1.0)

## 2021-08-18 LAB — MAGNESIUM: Magnesium: 1.8 mg/dL (ref 1.7–2.4)

## 2021-08-18 LAB — PHOSPHORUS: Phosphorus: 2.1 mg/dL — ABNORMAL LOW (ref 2.5–4.6)

## 2021-08-18 NOTE — TOC Initial Note (Signed)
Transition of Care [facility]) - Initial/Assessment Note    Patient Details  Name: [name] MRN: [id number] Date of Birth: 1950/03/26  Transition of Care [facility]) CM/SW Contact:    Bartholomew Crews, RN Phone Number:  [phone] 08/18/2021, 1:58 PM  Clinical Narrative:                  Spoke with patient on hospital room phone to discuss post acute transition. PTA home with son. Stated her son will provide transportation home at discharge. Has home oxygen through [employer] that includes stationary and portable tanks. Discussed recommendations for [facility] PT and OT. Patient recently active with [facility] and agreeable to resuming HH. Referral accepted. Patient will need HH PT/OT orders and Face to Face at discharge. Contact information for [facility] placed on AVS. TOC following for transition needs.   Expected Discharge Plan: [place] Barriers to Discharge: Continued Medical Work up   Patient Goals and CMS Choice Patient states their goals for this hospitalization and ongoing recovery are:: return home with her son CMS Medicare.gov Compare Post Acute Care list provided to:: Patient Choice offered to / list presented to : Patient  Expected Discharge Plan and Services Expected Discharge Plan: [place] In-house Referral: NA Discharge Planning Services: CM Consult Post Acute Care Choice: [place] arrangements for the past 2 months: Apartment                 DME Arranged: N/A DME Agency: NA       HH Arranged: PT, OT HH Agency: [facility] Date [place]: 08/18/21 Time [place]: 1478 Representative spoke with at [place]: Otila Kluver  Prior Living Arrangements/Services Living arrangements for the past 2 months: [place] with:: Self, Adult Children Patient language and need for interpreter reviewed:: Yes Do you feel safe going back to the place where you live?: Yes      Need for Family Participation in  Patient Care: Yes (Comment) Care giver support system in place?: Yes (comment) Current home services: DME (home oxygen with [employer]) Criminal Activity/Legal Involvement Pertinent to Current Situation/Hospitalization: No - Comment as needed  Activities of Daily Living Home Assistive Devices/Equipment: Walker (specify type), Cane (specify quad or straight), Grab bars in shower ADL Screening (condition at time of admission) Patient's cognitive ability adequate to safely complete daily activities?: Yes Is the patient deaf or have difficulty hearing?: No Does the patient have difficulty seeing, even when wearing glasses/contacts?: No Does the patient have difficulty concentrating, remembering, or making decisions?: No Patient able to express need for assistance with ADLs?: Yes Does the patient have difficulty dressing or bathing?: No Independently performs ADLs?: Yes (appropriate for developmental age) Does the patient have difficulty walking or climbing stairs?: Yes Weakness of Legs: Both Weakness of Arms/Hands: Both  Permission Sought/Granted                  Emotional Assessment Appearance:: Appears stated age Attitude/Demeanor/Rapport: Engaged Affect (typically observed): Accepting Orientation: : Oriented to Self, Oriented to  Time, Oriented to Place, Oriented to Situation Alcohol / Substance Use: Not Applicable Psych Involvement: No (comment)  Admission diagnosis:  Dehydration [E86.0] Gastroenteritis [K52.9] AKI (acute kidney injury) ([place]) [N17.9] Nausea, vomiting, and diarrhea [R11.2, R19.7] Hypotension due to hypovolemia [I95.89, E86.1] Patient Active Problem List   Diagnosis Date Noted   Nausea, vomiting, and diarrhea 08/16/2021   COVID-19 virus infection 08/16/2021   History of DVT (deep vein thrombosis)  08/16/2021   Prolonged QT interval 08/16/2021   Right ovarian cyst 08/16/2021   Elevated AST (SGOT) 08/16/2021   Preop cardiovascular exam 02/10/2021   Adnexal mass  12/23/2020   Polyneuropathy associated with underlying disease ([place]) 09/01/2020   Acute pansinusitis 03/18/2020   Diabetic peripheral neuropathy ([place]) 03/18/2020   Urine finding 03/18/2020   Aortic atherosclerosis ([place]) 01/13/2020   Diabetes mellitus with coincident hypertension ([place]) 01/13/2020   Type 2 diabetes mellitus with diabetic polyneuropathy, with long-term current use of insulin ([place]) 07/16/2019   Type 2 diabetes mellitus with hyperglycemia, with long-term current use of insulin ([place]) 04/20/2019   Diabetes mellitus ([place]) 04/20/2019   Uncontrolled diabetes mellitus 02/20/2019   Pressure injury of skin 02/20/2019   Demand ischemia (HCC)    Abnormal LFTs    Acute pancreatitis 01/12/2019   DKA (diabetic ketoacidoses) 01/12/2019   Vitamin D deficiency 11/05/2018   Vitamin B 12 deficiency 11/05/2018   Osteoarthritis of knee 07/02/2018   Tinea    Sleep apnea    Oxygen dependent    Obesity    Insomnia    Hyperlipidemia    Dermal mycosis    Depression    Coronary artery disease    Arthritis    Anxiety    ICD (implantable cardioverter-defibrillator) in place    Heme positive stool    Benign neoplasm of transverse colon    Bundle branch block 05/08/2016   Left leg pain    DVT (deep venous thrombosis) ([place]) 09/30/2015   Syncope 03/11/2015   AKI (acute kidney injury) ([place]) 03/11/2015   Hypotension 03/11/2015   Syncope and collapse 03/11/2015   Ventricular tachycardia ([place]) 01/17/2015   Pain in the chest    SOB (shortness of breath)    Thyroid nodule    CAP (community acquired pneumonia)    Chest pain 05/26/2014   Right flank pain 05/26/2014   Right flank discomfort    Chronic combined systolic and diastolic heart failure (HCC)    Angina pectoris ([place])    Morbid obesity ([place]) 11/06/2012   Angina decubitus ([place]) 11/06/2012   LBBB (left bundle branch block) 59/45/8592   Chronic systolic dysfunction of left ventricle 09/24/2012   Hypoglycemia 09/09/2012   HLD  (hyperlipidemia) 04/23/2012   Insulin dependent diabetes mellitus ([place]) 04/23/2012   Acute kidney injury superimposed on chronic kidney disease ([employer]) 03/10/2012   Ischemic cardiomyopathy 06/18/2010   CAD (coronary artery disease) 06/18/2010   Hypertension 06/18/2010   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

## 2021-08-18 NOTE — Evaluation (Signed)
Occupational Therapy Evaluation Patient Details Name: [name] MRN: [id number] DOB: Sep 02, 1950 Today's Date: 08/18/2021   History of Present Illness 71 y.o. female presents to [facility] on 08/16/2021 with nausea, vomiting, and diarrhea after consuming [employer] Shrimp. Pt found to be COVID+. CT scan of the abdomen and pelvis noted enlarging cystic right ovarian lesion now 6.9 cm. Urinalysis suspicious for UTI. PMH includes CHF, CABG, DMII, chronic respiratory failure on 2L O2, DVT, anxiety, depression, OSA.   Clinical Impression   Prior to this admission, patient was mainly homebound with personal care attendant coming 2 hours a day to assist with ADLs and IADLs and living with her son. Currently, patient presenting with poor activity tolerance and endurance, requiring increased encouragement to participate. Patient is min A for bed mobility, mod-max A for ADLs (though is may be baseline, patient did not give definitive answer) and declined transfers due to fatigue. OT providing education on importance of movement given COVID 19 diagnosis with patient in agreement but continuing to decline ambulation or transfers. OT recommending [place] services at discharge, OT will continue to follow acutely.       Recommendations for follow up therapy are one component of a multi-disciplinary discharge planning process, led by the attending physician.  Recommendations may be updated based on patient status, additional functional criteria and insurance authorization.   Follow Up Recommendations  Home health OT    Assistance Recommended at Discharge Intermittent Supervision/Assistance  Patient can return home with the following A little help with walking and/or transfers;A lot of help with bathing/dressing/bathroom;Assistance with cooking/housework;Assist for transportation;Help with stairs or ramp for entrance    Functional Status Assessment  Patient has had a recent decline in their functional status  and demonstrates the ability to make significant improvements in function in a reasonable and predictable amount of time.  Equipment Recommendations  None recommended by OT (Patient has DME needed)    Recommendations for Other Services       Precautions / Restrictions Precautions Precautions: Fall Restrictions Weight Bearing Restrictions: No      Mobility Bed Mobility Overal bed mobility: Needs Assistance Bed Mobility: Supine to Sit     Supine to sit: Min assist     General bed mobility comments: use of bed rails, verbal cues for sequencing. Assist to pivot hips with bed pad    Transfers                   General transfer comment: declined due to fatigue      Balance                                           ADL either performed or assessed with clinical judgement   ADL Overall ADL's : Needs assistance/impaired Eating/Feeding: Set up;Sitting   Grooming: Set up;Sitting   Upper Body Bathing: Minimal assistance;Sitting   Lower Body Bathing: Moderate assistance;Maximal assistance;Sitting/lateral leans;Sit to/from stand   Upper Body Dressing : Set up;Sitting   Lower Body Dressing: Moderate assistance;Maximal assistance;Sitting/lateral leans;Sit to/from stand   Toilet Transfer: Stand-pivot;Moderate assistance;Rolling walker (2 wheels)   Toileting- Clothing Manipulation and Hygiene: Moderate assistance;Maximal assistance;Sitting/lateral lean;Sit to/from stand       Functional mobility during ADLs: Moderate assistance;Cueing for safety;Cueing for sequencing General ADL Comments: Patient presenting with decreased activity tolerance and fatigue     Vision Baseline Vision/History: 1 Wears glasses Ability to See  in Adequate Light: 0 Adequate Patient Visual Report: No change from baseline       Perception     Praxis      Pertinent Vitals/Pain Pain Assessment Pain Assessment: Faces Faces Pain Scale: Hurts even more Pain Location:  generalized with movement, did not specify Pain Descriptors / Indicators: Grimacing, Guarding Pain Intervention(s): Limited activity within patient's tolerance, Monitored during session, Repositioned     Hand Dominance Right   Extremity/Trunk Assessment Upper Extremity Assessment Upper Extremity Assessment: Generalized weakness   Lower Extremity Assessment Lower Extremity Assessment: Defer to PT evaluation   Cervical / Trunk Assessment Cervical / Trunk Assessment: Normal   Communication Communication Communication: No difficulties   Cognition Arousal/Alertness: Lethargic Behavior During Therapy: Flat affect Overall Cognitive Status: Within Functional Limits for tasks assessed                                 General Comments: flat but participatory     General Comments  VSS on 2L in session    Exercises     Shoulder Instructions      Home Living Family/patient expects to be discharged to:: Private residence Living Arrangements: Children Available Help at Discharge: Family;Available PRN/intermittently;Other (Comment) Type of Home: House Home Access: Ramped entrance     Home Layout: One level     Bathroom Shower/Tub: Occupational psychologist: Standard Bathroom Accessibility: No   Home Equipment: Rollator (4 wheels);Other (comment);Shower seat (oxygen)          Prior Functioning/Environment Prior Level of Function : Needs assist             Mobility Comments: pt ambulates household distances with rollator ADLs Comments: minA for bathing, caring hands does cooking and cleaning        OT Problem List: Decreased strength;Decreased activity tolerance;Impaired balance (sitting and/or standing);Decreased knowledge of use of DME or AE;Cardiopulmonary status limiting activity;Obesity;Pain      OT Treatment/Interventions: Self-care/ADL training;Therapeutic exercise;Energy conservation;DME and/or AE instruction;Manual  therapy;Therapeutic activities;Patient/family education;Balance training    OT Goals(Current goals can be found in the care plan section) Acute Rehab OT Goals Patient Stated Goal: to feel better OT Goal Formulation: With patient Time For Goal Achievement: 09/01/21 Potential to Achieve Goals: Fair ADL Goals Pt Will Perform Lower Body Bathing: with min assist;sitting/lateral leans;sit to/from stand;with adaptive equipment Pt Will Perform Lower Body Dressing: with min assist;with adaptive equipment;sitting/lateral leans;sit to/from stand Pt Will Transfer to Toilet: with supervision;ambulating;regular height toilet;grab bars Pt/caregiver will Perform Home Exercise Program: Increased ROM;Increased strength;Both right and left upper extremity;With theraband;With Supervision;With written HEP provided Additional ADL Goal #1: Patient will demonstrate increased activity tolerance to complete functional task in standing for 2-4 minutes without seated rest break to promote increased independence at home.  OT Frequency: Min 2X/week    Co-evaluation              AM-PAC OT "6 Clicks" Daily Activity     Outcome Measure Help from another person eating meals?: A Little Help from another person taking care of personal grooming?: A Little Help from another person toileting, which includes using toliet, bedpan, or urinal?: A Lot Help from another person bathing (including washing, rinsing, drying)?: A Lot Help from another person to put on and taking off regular upper body clothing?: A Little Help from another person to put on and taking off regular lower body clothing?: A Lot 6 Click Score: 15  End of Session Nurse Communication: Mobility status  Activity Tolerance: Patient limited by fatigue;Patient limited by lethargy Patient left: with call bell/phone within reach;with bed alarm set;Other (comment) (sitting EOB)  OT Visit Diagnosis: Unsteadiness on feet (R26.81);Other abnormalities of gait and  mobility (R26.89);Muscle weakness (generalized) (M62.81);Pain Pain - part of body:  Jeraldine Loots)                Time: 1290-4753 OT Time Calculation (min): 14 min Charges:  OT General Charges $OT Visit: 1 Visit OT Evaluation $OT Eval Moderate Complexity: 1 Mod  Corinne Ports E. Latrese Carolan, OTR/L Acute Rehabilitation Services [phone]   Ascencion Dike 08/18/2021, 1:08 PM

## 2021-08-18 NOTE — Progress Notes (Signed)
Progress Note   Patient: [name] PJK:[id number] DOB: [birth year] DOA: 08/16/2021     1 DOS: the patient was seen and examined on 08/18/2021   Brief hospital course: 71 y.o. female with medical history significant of combined CHF, s/p AICD, CAD s/p CABG, DM type II, chronic respiratory failure on 2 L of oxygen, history of DVT on chronic anticoagulation, anxiety, depression, and OSA who presents with complaints of nausea, vomiting, and diarrhea which started yesterday evening after she had at [employer] shrimp.  Emesis and diarrhea were noted to be nonbloody.'s.  Nobody else in the household had different.  However she had not been feeling well over the last couple of days and complained of fatigue, weakness, and intermittent productive cough.  Her son noticed that the patient has oxygen at home, but does not wear it all the time.  Patient denied having any significant fever, recent sick contacts to her knowledge, chest pain, shortness of breath, abdominal pain, or dysuria.  She has not recently been on antibiotics, but did admit to decreased p.o. intake.  Patient never received any of the COVID-19 vaccinations.  Her son notes that the patient has been following at [facility] for a lesion on her right ovary, but due to transportation issues had been having difficulty following back up.   On admission to the emergency department patient was seen with temperature up to 99.1 F with a pulse 58-67, respirations 16-24, blood pressure 92/52-122/64, and O2 saturations currently maintained on 1-3 L of nasal cannula oxygen.  Labs significant for hemoglobin 13.2, platelet count 127, BUN 25, creatinine 1.60, and glucose 165.  Chest x-ray noted low lung volumes and likely vascular congestion with suspected small pleural effusions possibly related with CHF.  CT scan of the abdomen and pelvis noted enlarging cystic right ovarian lesion now 6.9 cm.  COVID-19 screening positive.  Assessment and Plan: Nausea, vomiting,  and diarrhea  COVID-19 infection Patient presented with nausea, vomiting, and diarrhea after eating shrimp from [employer] evening prior to presentation.  Family reported that the patient has been coughing for the last 2 days, but had no known recent sick contacts.  She is not vaccinated against COVID-19 but was found to be positive.  Question possibility of gastroenteritis related to either to the Popeyes food eaten last night versus COVID-19 -Albuterol inhaler every 6 hours -cont Molnupiravir -Vitamin C and zinc -Antitussives as needed -Antiemetics as needed -Still feeling nauseated today, willing to keep with full liquid diet for now. Advance as tolerated   Acute kidney injury superimposed on chronic kidney disease stage IIIa Patient presents with creatinine elevated up to 1.6 with BUN 25.  Creatinine last had been 1.05 on 04/28/2021.  This is greater than 0.3 increased to suggest acute kidney injury.  Suspect dehydration is likely cause of symptoms as patient had been reporting nausea and vomiting.  Given IVF overnight -Monitor intake and output -Avoid nephrotoxic agents   (Addendum)UTI Acute.  Urinalysis positive for moderate hemoglobin, small leukocytes, many bacteria, 11-20 WBC cc.   -Urine culture pending -Continued on empiric rocephin   Transient hypotension Acute. Initial blood pressure noted to be as low as 92/50.  Blood pressures improved with IV fluids and currently maintained with maps greater than 65.  Suspect due to dehydration in the setting of nausea, vomiting, diarrhea, and decreased p.o. intake. -Hold home blood pressure regimen and encourage PO -BP improving   Chronic respiratory with hypoxia Patient was never documented to be hypoxic and appears to have  2 L of nasal cannula oxygen available if needed at home. 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  Patient does not appear  to grossly fluid overloaded at this time.  She had been given a total of 2 L of IV fluids. -Strict I&O's and daily weights\   Prolonged QT interval Acute.  QTc 508 on admission. -Correct any electrolyte abnormality -Avoid QT prolonging medication   CAD s/p CABG   Diabetes mellitus type 2 Last available hemoglobin A1c was 8.2 04/28/2021.  Initial glucose 165. -Hypoglycemic protocols -CBGs before every meal and nightly with moderate SSI -Adjust insulin regimen as needed   History of DVT on chronic anticoagulation -Continue Eliquis   Hyperlipidemia -Continue Crestor   Right ovarian cyst /lesion Acute on chronic.  Patient has been being followed by [facility] gynecology last seen in May.  CT scan of the abdomen pelvis noted enlarging right ovarian lesion now 6.9 cm(previously 5.2 cm) last year that is concerning for malignancy. -Have patient follow-up in the outpatient setting with [facility] gynecology   Elevated AST Chronic.  AST mildly elevated at 53, but seem to be elevated similarly previously in 11/2020. -Continue to monitor   OSA Patient is not on CPAP at night.     Subjective: Still feeling nauseated, but able to evenutally tolerate liquid diet. Not yet willing to advance diet at this time  Physical Exam: Vitals:   08/17/21 0921 08/17/21 2121 08/18/21 0633 08/18/21 0908  BP: 136/61 (!) 152/73 (!) 127/53 (!) 148/66  Pulse: 64 64 73 80  Resp: '18 19 19 18  '$ Temp: 98.4 F (36.9 C) 98.9 F (37.2 C) 98.9 F (37.2 C) 99 F (37.2 C)  TempSrc:  Oral  Oral  SpO2: 96% 100% 92% 94%  Weight:      Height:       General exam: Conversant, in no acute distress Respiratory system: normal chest rise, clear, no audible wheezing Cardiovascular system: regular rhythm, s1-s2 Gastrointestinal system: Nondistended, nontender, pos BS Central nervous system: No seizures, no tremors Extremities: No cyanosis, no joint deformities Skin: No rashes, no pallor Psychiatry: Affect normal // no auditory  hallucinations   Data Reviewed:  Labs reviewed: CRP 1.6, WBC 3.1, Hgb 13.6, Plts 86  Family Communication: Pt in room, family not at bedside  Disposition: Status is: Observation The patient remains OBS appropriate and will d/c before 2 midnights.  Planned Discharge Destination: Home     Author: Marylu Lund, MD 08/18/2021 3:48 PM  For on call review [email or web address].

## 2021-08-19 ENCOUNTER — Encounter (HOSPITAL_COMMUNITY): Payer: Self-pay | Admitting: Internal Medicine

## 2021-08-19 DIAGNOSIS — R112 Nausea with vomiting, unspecified: Secondary | ICD-10-CM | POA: Diagnosis not present

## 2021-08-19 DIAGNOSIS — R197 Diarrhea, unspecified: Secondary | ICD-10-CM | POA: Diagnosis not present

## 2021-08-19 DIAGNOSIS — U071 COVID-19: Secondary | ICD-10-CM | POA: Diagnosis not present

## 2021-08-19 DIAGNOSIS — N179 Acute kidney failure, unspecified: Secondary | ICD-10-CM | POA: Diagnosis not present

## 2021-08-19 LAB — COMPREHENSIVE METABOLIC PANEL
ALT: 28 U/L (ref 0–44)
AST: 30 U/L (ref 15–41)
Albumin: 2.9 g/dL — ABNORMAL LOW (ref 3.5–5.0)
Alkaline Phosphatase: 70 U/L (ref 38–126)
Anion gap: 5 (ref 5–15)
BUN: 9 mg/dL (ref 8–23)
CO2: 26 mmol/L (ref 22–32)
Calcium: 9 mg/dL (ref 8.9–10.3)
Chloride: 108 mmol/L (ref 98–111)
Creatinine, Ser: 0.85 mg/dL (ref 0.44–1.00)
GFR, Estimated: >60 mL/min (ref >60)
Glucose, Bld: 189 mg/dL — ABNORMAL HIGH (ref 70–99)
Potassium: 4.2 mmol/L (ref 3.5–5.1)
Sodium: 139 mmol/L (ref 135–145)
Total Bilirubin: 0.7 mg/dL (ref 0.3–1.2)
Total Protein: 6.9 g/dL (ref 6.5–8.1)

## 2021-08-19 LAB — CBC WITH DIFFERENTIAL/PLATELET
Abs Immature Granulocytes: 0 10*3/uL (ref 0.00–0.07)
Basophils Absolute: 0 10*3/uL (ref 0.0–0.1)
Basophils Relative: 0 %
Eosinophils Absolute: 0.1 10*3/uL (ref 0.0–0.5)
Eosinophils Relative: 2 %
HCT: 43.1 % (ref 36.0–46.0)
Hemoglobin: 13.9 g/dL (ref 12.0–15.0)
Immature Granulocytes: 0 %
Lymphocytes Relative: 64 %
Lymphs Abs: 1.6 10*3/uL (ref 0.7–4.0)
MCH: 31.7 pg (ref 26.0–34.0)
MCHC: 32.3 g/dL (ref 30.0–36.0)
MCV: 98.4 fL (ref 80.0–100.0)
Monocytes Absolute: 0.3 10*3/uL (ref 0.1–1.0)
Monocytes Relative: 13 %
Neutro Abs: 0.5 10*3/uL — ABNORMAL LOW (ref 1.7–7.7)
Neutrophils Relative %: 21 %
Platelets: 83 10*3/uL — ABNORMAL LOW (ref 150–400)
RBC: 4.38 MIL/uL (ref 3.87–5.11)
RDW: 16.6 % — ABNORMAL HIGH (ref 11.5–15.5)
WBC: 2.5 10*3/uL — ABNORMAL LOW (ref 4.0–10.5)
nRBC: 0.8 % — ABNORMAL HIGH (ref 0.0–0.2)

## 2021-08-19 LAB — GLUCOSE, CAPILLARY
Glucose-Capillary: 178 mg/dL — ABNORMAL HIGH (ref 70–99)
Glucose-Capillary: 249 mg/dL — ABNORMAL HIGH (ref 70–99)
Glucose-Capillary: 250 mg/dL — ABNORMAL HIGH (ref 70–99)
Glucose-Capillary: 239 mg/dL — ABNORMAL HIGH (ref 70–99)

## 2021-08-19 LAB — PHOSPHORUS: Phosphorus: 2.3 mg/dL — ABNORMAL LOW (ref 2.5–4.6)

## 2021-08-19 LAB — MAGNESIUM: Magnesium: 1.7 mg/dL (ref 1.7–2.4)

## 2021-08-19 LAB — C-REACTIVE PROTEIN: CRP: 1 mg/dL — ABNORMAL HIGH (ref <1.0)

## 2021-08-19 NOTE — Progress Notes (Signed)
Progress Note   Patient: [name] SWN:[id number] DOB: March 09, 1950 DOA: 08/16/2021     1 DOS: the patient was seen and examined on 08/19/2021   Brief hospital course: 71 y.o. female with medical history significant of combined CHF, s/p AICD, CAD s/p CABG, DM type II, chronic respiratory failure on 2 L of oxygen, history of DVT on chronic anticoagulation, anxiety, depression, and OSA who presents with complaints of nausea, vomiting, and diarrhea which started yesterday evening after she had at [employer] shrimp.  Emesis and diarrhea were noted to be nonbloody.'s.  Nobody else in the household had different.  However she had not been feeling well over the last couple of days and complained of fatigue, weakness, and intermittent productive cough.  Her son noticed that the patient has oxygen at home, but does not wear it all the time.  Patient denied having any significant fever, recent sick contacts to her knowledge, chest pain, shortness of breath, abdominal pain, or dysuria.  She has not recently been on antibiotics, but did admit to decreased p.o. intake.  Patient never received any of the COVID-19 vaccinations.  Her son notes that the patient has been following at [place] for a lesion on her right ovary, but due to transportation issues had been having difficulty following back up.   On admission to the emergency department patient was seen with temperature up to 99.1 F with a pulse 58-67, respirations 16-24, blood pressure 92/52-122/64, and O2 saturations currently maintained on 1-3 L of nasal cannula oxygen.  Labs significant for hemoglobin 13.2, platelet count 127, BUN 25, creatinine 1.60, and glucose 165.  Chest x-ray noted low lung volumes and likely vascular congestion with suspected small pleural effusions possibly related with CHF.  CT scan of the abdomen and pelvis noted enlarging cystic right ovarian lesion now 6.9 cm.  COVID-19 screening positive.  Assessment and Plan: Nausea, vomiting,  and diarrhea  COVID-19 infection Patient presented with nausea, vomiting, and diarrhea after eating shrimp from [employer] evening prior to presentation.  Family reported that the patient has been coughing for the last 2 days, but had no known recent sick contacts.  She is not vaccinated against COVID-19 but was found to be positive.  Question possibility of gastroenteritis related to either to the [employer] food eaten last night versus COVID-19 -Albuterol inhaler every 6 hours -cont Molnupiravir -Vitamin C and zinc -Antitussives as needed -Antiemetics as needed -Feeling much better today, willing to advance diet   Acute kidney injury superimposed on chronic kidney disease stage IIIa Patient presents with creatinine elevated up to 1.6 with BUN 25.  Creatinine last had been 1.05 on 04/28/2021.  This is greater than 0.3 increased to suggest acute kidney injury.  Suspect dehydration is likely cause of symptoms as patient had been reporting nausea and vomiting.  Given IVF overnight -Monitor intake and output -Avoid nephrotoxic agents   (Addendum)UTI Acute.  Urinalysis positive for moderate hemoglobin, small leukocytes, many bacteria, 11-20 WBC cc.   -Urine culture pending -Continued on empiric rocephin   Transient hypotension Acute. Initial blood pressure noted to be as low as 92/50.  Blood pressures improved with IV fluids and currently maintained with maps greater than 65.  Suspect due to dehydration in the setting of nausea, vomiting, diarrhea, and decreased p.o. intake. -Hold home blood pressure regimen and encourage PO -BP improved   Chronic respiratory with hypoxia Patient was never documented to be hypoxic and appears to have 2 L of nasal cannula oxygen available if  needed at home. 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  Patient does not appear to grossly fluid overloaded at this time.  She had  been given a total of 2 L of IV fluids. -Strict I&O's and daily weights\   Prolonged QT interval Acute.  QTc 508 on admission. -Correct any electrolyte abnormality -Avoid QT prolonging medication   CAD s/p CABG   Diabetes mellitus type 2 Last available hemoglobin A1c was 8.2 04/28/2021.  Initial glucose 165. -Hypoglycemic protocols -CBGs before every meal and nightly with moderate SSI -Adjust insulin regimen as needed   History of DVT on chronic anticoagulation -Continue Eliquis   Hyperlipidemia -Continue Crestor   Right ovarian cyst /lesion Acute on chronic.  Patient has been being followed by [facility] gynecology last seen in May.  CT scan of the abdomen pelvis noted enlarging right ovarian lesion now 6.9 cm(previously 5.2 cm) last year that is concerning for malignancy. -Have patient follow-up in the outpatient setting with [facility]necology   Elevated AST Chronic.  AST mildly elevated at 53, but seem to be elevated similarly previously in 11/2020. -Continue to monitor   OSA Patient is not on CPAP at night.     Subjective: Feeling better today. No nausea currently, eager to start regular diet  Physical Exam: Vitals:   08/18/21 1600 08/18/21 2111 08/19/21 0500 08/19/21 1020  BP: (!) 149/70 (!) 153/75 (!) 149/60 (!) 147/65  Pulse: 69 64 69 61  Resp: '18 18 18 17  '$ Temp: 98.5 F (36.9 C) 97.9 F (36.6 C) 98.6 F (37 C) 98.5 F (36.9 C)  TempSrc: Oral  Oral   SpO2: 99% 100% 100% 99%  Weight:   110 kg   Height:       General exam: Awake, laying in bed, in nad Respiratory system: Normal respiratory effort, no wheezing Cardiovascular system: regular rate, s1, s2 Gastrointestinal system: Soft, nondistended, positive BS Central nervous system: CN2-12 grossly intact, strength intact Extremities: Perfused, no clubbing Skin: Normal skin turgor, no notable skin lesions seen Psychiatry: Mood normal // no visual hallucinations   Data Reviewed:  Labs reviewed: CRP 1.0, WBC 2.5,  Hgb 13.9, Plts 83  Family Communication: Pt in room, family not at bedside  Disposition: Status is: Observation The patient remains OBS appropriate and will d/c before 2 midnights.  Planned Discharge Destination: Home    Author: Marylu Lund, MD 08/19/2021 3:37 PM  For on call review [email or web address].

## 2021-08-20 DIAGNOSIS — R112 Nausea with vomiting, unspecified: Secondary | ICD-10-CM | POA: Diagnosis not present

## 2021-08-20 DIAGNOSIS — I2510 Atherosclerotic heart disease of native coronary artery without angina pectoris: Secondary | ICD-10-CM | POA: Diagnosis not present

## 2021-08-20 DIAGNOSIS — N179 Acute kidney failure, unspecified: Secondary | ICD-10-CM | POA: Diagnosis not present

## 2021-08-20 DIAGNOSIS — U071 COVID-19: Secondary | ICD-10-CM | POA: Diagnosis not present

## 2021-08-20 LAB — PHOSPHORUS: Phosphorus: 2.2 mg/dL — ABNORMAL LOW (ref 2.5–4.6)

## 2021-08-20 LAB — CBC WITH DIFFERENTIAL/PLATELET
Abs Immature Granulocytes: 0 10*3/uL (ref 0.00–0.07)
Band Neutrophils: 1 %
Basophils Absolute: 0 10*3/uL (ref 0.0–0.1)
Basophils Relative: 1 %
Eosinophils Absolute: 0.1 10*3/uL (ref 0.0–0.5)
Eosinophils Relative: 2 %
HCT: 43.2 % (ref 36.0–46.0)
Hemoglobin: 14.4 g/dL (ref 12.0–15.0)
Lymphocytes Relative: 60 %
Lymphs Abs: 1.8 10*3/uL (ref 0.7–4.0)
MCH: 32.1 pg (ref 26.0–34.0)
MCHC: 33.3 g/dL (ref 30.0–36.0)
MCV: 96.4 fL (ref 80.0–100.0)
Monocytes Absolute: 0.4 10*3/uL (ref 0.1–1.0)
Monocytes Relative: 14 %
Neutro Abs: 0.7 10*3/uL — ABNORMAL LOW (ref 1.7–7.7)
Neutrophils Relative %: 22 %
Platelets: 82 10*3/uL — ABNORMAL LOW (ref 150–400)
RBC: 4.48 MIL/uL (ref 3.87–5.11)
RDW: 16.5 % — ABNORMAL HIGH (ref 11.5–15.5)
WBC: 3 10*3/uL — ABNORMAL LOW (ref 4.0–10.5)
nRBC: 0.7 % — ABNORMAL HIGH (ref 0.0–0.2)
nRBC: 1 /100 WBC — ABNORMAL HIGH

## 2021-08-20 LAB — GLUCOSE, CAPILLARY
Glucose-Capillary: 192 mg/dL — ABNORMAL HIGH (ref 70–99)
Glucose-Capillary: 246 mg/dL — ABNORMAL HIGH (ref 70–99)

## 2021-08-20 LAB — MAGNESIUM: Magnesium: 1.7 mg/dL (ref 1.7–2.4)

## 2021-08-20 LAB — C-REACTIVE PROTEIN: CRP: 0.6 mg/dL (ref <1.0)

## 2021-08-20 MED ORDER — MOLNUPIRAVIR EUA 200MG CAPSULE
4.0000 | ORAL_CAPSULE | Freq: Two times a day (BID) | ORAL | 0 refills | Status: AC
Start: 2021-08-20 — End: 2021-08-25

## 2021-08-20 NOTE — Discharge Summary (Addendum)
Physician Discharge Summary   Patient: [name] MRN: [id number] DOB: [birth year]  Admit date:     08/16/2021  Discharge date: 08/20/21  Discharge Physician: Marylu Lund   PCP: Everardo Beals, NP   Recommendations at discharge:    Follow up with PCP in 1-2 weeks Recommend follow up with [facility] to eval enlarging ovarian mass  Discharge Diagnoses: Principal Problem:   Nausea, vomiting, and diarrhea Active Problems:   COVID-19 virus infection   Acute kidney injury superimposed on chronic kidney disease (HCC)   CAD (coronary artery disease)   Hypotension   Hyperlipidemia   ICD (implantable cardioverter-defibrillator) in place   Diabetes mellitus ([place])   History of DVT (deep vein thrombosis)   Prolonged QT interval   Right ovarian cyst   Elevated AST (SGOT)  Resolved Problems:   * No resolved hospital problems. *  Hospital Course: 71 y.o. female with medical history significant of combined CHF, s/p AICD, CAD s/p CABG, DM type II, chronic respiratory failure on 2 L of oxygen, history of DVT on chronic anticoagulation, anxiety, depression, and OSA who presents with complaints of nausea, vomiting, and diarrhea which started yesterday evening after she had at [employer] shrimp.  Emesis and diarrhea were noted to be nonbloody.'s.  Nobody else in the household had different.  However she had not been feeling well over the last couple of days and complained of fatigue, weakness, and intermittent productive cough.  Her son noticed that the patient has oxygen at home, but does not wear it all the time.  Patient denied having any significant fever, recent sick contacts to her knowledge, chest pain, shortness of breath, abdominal pain, or dysuria.  She has not recently been on antibiotics, but did admit to decreased p.o. intake.  Patient never received any of the COVID-19 vaccinations.  Her son notes that the patient has been following at [facility] for a lesion on her right ovary, but  due to transportation issues had been having difficulty following back up.   On admission to the emergency department patient was seen with temperature up to 99.1 F with a pulse 58-67, respirations 16-24, blood pressure 92/52-122/64, and O2 saturations currently maintained on 1-3 L of nasal cannula oxygen.  Labs significant for hemoglobin 13.2, platelet count 127, BUN 25, creatinine 1.60, and glucose 165.  Chest x-ray noted low lung volumes and likely vascular congestion with suspected small pleural effusions possibly related with CHF.  CT scan of the abdomen and pelvis noted enlarging cystic right ovarian lesion now 6.9 cm.  COVID-19 screening positive.  Assessment and Plan: Nausea, vomiting, and diarrhea  COVID-19 infection Patient presented with nausea, vomiting, and diarrhea after eating shrimp from [employer] evening prior to presentation.  Family reported that the patient has been coughing for the last 2 days, but had no known recent sick contacts.  She is not vaccinated against COVID-19 but was found to be positive.  Question possibility of gastroenteritis related to either to the [employer] food eaten last night versus COVID-19 -Albuterol inhaler every 6 hours -cont Molnupiravir -Given Vitamin C and zinc -Feeling much better, successfully advanced diet   Acute kidney injury superimposed on chronic kidney disease stage IIIa Patient presents with creatinine elevated up to 1.6 with BUN 25.  Creatinine last had been 1.05 on 04/28/2021.  This is greater than 0.3 increased to suggest acute kidney injury.  Suspect dehydration is likely cause of symptoms as patient had been reporting nausea and vomiting.  Given IVF with improvement   (  Addendum)UTI Acute.  Urinalysis positive for moderate hemoglobin, small leukocytes, many bacteria, 11-20 WBC cc.   -Urine culture indeterminate -Completed course of rocephin this visit   Transient hypotension Acute. Initial blood pressure noted to be as low as 92/50.   Blood pressures improved with IV fluids and currently maintained with maps greater than 65.  Suspect due to dehydration in the setting of nausea, vomiting, diarrhea, and decreased p.o. intake. -Held home blood pressure regimen and encourage PO -BP improved -cont home regimen on d/c   Chronic respiratory with hypoxia Patient was never documented to be hypoxic and appears to have 2 L of nasal cannula oxygen available if needed at home. 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  Patient does not appear to grossly fluid overloaded at this time.  She had been given a total of 2 L of IV fluids. -Strict I&O's and daily weights   Prolonged QT interval Acute.  QTc 508 on admission. -Correct any electrolyte abnormality -Avoid QT prolonging medication   CAD s/p CABG   Diabetes mellitus type 2 Last available hemoglobin A1c was 8.2 04/28/2021.  Initial glucose 165. -Hypoglycemic protocols -CBGs before every meal and nightly with moderate SSI -Adjust insulin regimen as needed   History of DVT on chronic anticoagulation -Continue Eliquis   Hyperlipidemia -Continue Crestor   Right ovarian cyst /lesion Acute on chronic.  Patient has been being [facility] gynecology last seen in May.  CT scan of the abdomen pelvis noted enlarging right ovarian lesion now 6.9 cm(previously 5.2 cm) last year that is concerning for malignancy. -Have patient follow-up in the outpatient setting with [facility] gynecology -discussed with patient who is aware and will f/u with her Gyn-onc.    Elevated AST Chronic.  AST mildly elevated at 53, but seem to be elevated similarly previously in 11/2020. -Continue to monitor   OSA Patient is not on CPAP at night.  Pressure Injury 08/16/21 Buttocks Right;Medial Stage 2 -  Partial thickness loss of dermis presenting as a shallow open injury with a red, pink wound bed without slough.  partial tissue loss 2cm x 3 cm reddened area (Active)  08/16/21 1750  Location: Buttocks  Location Orientation: Right;Medial  Staging: Stage 2 -  Partial thickness loss of dermis presenting as a shallow open injury with a red, pink wound bed without slough.  Wound Description (Comments): partial tissue loss 2cm x 3 cm reddened area  Present on Admission: Yes           Consultants:  Procedures performed:   Disposition: Home Diet recommendation:  Cardiac diet DISCHARGE MEDICATION: Allergies as of 08/20/2021       Reactions   Lipitor  [atorvastatin Calcium] Rash   Metformin And Related Itching, Swelling, Other (See Comments)   Leg pain & swelling in legs   Ozempic (0.25 Or 0.5 Mg-dose) [semaglutide(0.25 Or 0.'5mg'$ -dos)] Other (See Comments)   Pt with hx of pancreatitis    Rosuvastatin    Atorvastatin Itching, Rash   Atorvastatin Calcium Itching, Rash   Levofloxacin Itching        Medication List     STOP taking these medications    ciprofloxacin 500 MG tablet Commonly known as: CIPRO       TAKE these medications    albuterol (2.5 MG/3ML) 0.083% nebulizer solution Commonly known as: PROVENTIL Take 2.5 mg by nebulization every 6 (six) hours as needed for  wheezing or shortness of breath.   Alcohol Wipes 70 % Pads DropSafe Alcohol Prep Pads  USE WHEN CHECKING BLOOD SUGARS   amitriptyline 10 MG tablet Commonly known as: ELAVIL Take 10 mg by mouth at bedtime.   apixaban 5 MG Tabs tablet Commonly known as: Eliquis Take 1 tablet (5 mg total) by mouth 2 (two) times daily.   Baclofen 5 MG Tabs Take 5 mg by mouth daily.   carvedilol 6.25 MG tablet Commonly known as: COREG Take 1 tablet (6.25 mg total) by mouth 2 (two) times daily with a meal.   cyanocobalamin 1000 MCG/ML injection Commonly known as: VITAMIN B12 1,000 mcg every 30 (thirty) days.   Dexcom G6 Sensor Misc 1 Device by Does not apply route as directed.   Dexcom G6 Transmitter Misc 1 Device by  Does not apply route as directed.   Droplet Pen Needles 32G X 4 MM Misc Generic drug: Insulin Pen Needle USE IN THE MORNING, AT NOON, IN THE EVENING, AND AT BEDTIME.   Farxiga 10 MG Tabs tablet Generic drug: dapagliflozin propanediol Take 10 mg by mouth daily.   ferrous sulfate 325 (65 FE) MG EC tablet Take 325 mg by mouth every other day.   furosemide 40 MG tablet Commonly known as: LASIX TAKE 1 TABLET EVERY DAY   gabapentin 300 MG capsule Commonly known as: NEURONTIN Take 300 mg by mouth at bedtime.   isosorbide mononitrate 30 MG 24 hr tablet Commonly known as: IMDUR Take 30 mg by mouth daily.   latanoprost 0.005 % ophthalmic solution Commonly known as: XALATAN Administer 1 drop to both eyes nightly.   lisinopril 2.5 MG tablet Commonly known as: ZESTRIL Take 1 tablet by mouth daily.   meloxicam 15 MG tablet Commonly known as: MOBIC Take 1 tablet by mouth daily.   methocarbamol 500 MG tablet Commonly known as: ROBAXIN Take 1 tablet (500 mg total) by mouth 2 (two) times daily as needed for muscle spasms.   molnupiravir EUA 200 mg Caps capsule Commonly known as: LAGEVRIO Take 4 capsules (800 mg total) by mouth 2 (two) times daily for 5 days.   nitroGLYCERIN 0.4 MG SL tablet Commonly known as: NITROSTAT Place 0.4 mg under the tongue every 5 (five) minutes as needed for chest pain.   NovoLOG FlexPen 100 UNIT/ML FlexPen Generic drug: insulin aspart Max daily 30 units per scale What changed:  how much to take when to take this reasons to take this additional instructions   potassium chloride SA 20 MEQ tablet Commonly known as: KLOR-CON M Take 20 mEq by mouth daily as needed (for low pottassium).   rosuvastatin 20 MG tablet Commonly known as: CRESTOR Take 1 tablet (20 mg total) by mouth daily at 6 PM.   traMADol 50 MG tablet Commonly known as: ULTRAM Take 50 mg by mouth daily.   Tyler Aas FlexTouch 100 UNIT/ML FlexTouch Pen Generic drug: insulin  degludec INJECT 24 UNITS INTO THE SKIN DAILY. What changed: how much to take   Vitamin D (Ergocalciferol) 1.25 MG (50000 UNIT) Caps capsule Commonly known as: DRISDOL Take 1 capsule (50,000 Units total) by mouth every 7 (seven) days.        Follow-up Information     [facility] Follow up.   Specialty: [place] Why: Someone from the office will call to schedule home health visits Contact information: [address] [place] [address] [phone]         Everardo Beals, NP Follow up  in 1 week(s).   Why: Hospital follow up Contact information: [place] [address] [phone]         Delrae Rend, MD .   Specialty: Endocrinology Contact information: [address] [place] [address] [phone]         Jerline Pain, MD .   Specialty: Cardiology Contact information: [phone] [place] [address] [phone]         Thompson Grayer, MD .   Specialty: Cardiology Contact information: [place] [address] [phone]                Discharge Exam: Danley Danker Weights   08/16/21 1726 08/19/21 0500  Weight: 109.7 kg 110 kg   General exam: Awake, laying in bed, in nad Respiratory system: Normal respiratory effort, no wheezing Cardiovascular system: regular rate, s1, s2 Gastrointestinal system: Soft, nondistended, positive BS Central nervous system: CN2-12 grossly intact, strength intact Extremities: Perfused, no clubbing Skin: Normal skin turgor, no notable skin lesions seen Psychiatry: Mood normal // no visual hallucinations   Condition at discharge: fair  The results of significant diagnostics from this hospitalization (including imaging, microbiology, ancillary and laboratory) are listed below for reference.   Imaging Studies: CT ABDOMEN PELVIS WO CONTRAST  Result Date: 08/16/2021 CLINICAL DATA:  71 year old  female with sudden onset vomiting and diarrhea. EXAM: CT ABDOMEN AND PELVIS WITHOUT CONTRAST TECHNIQUE: Multidetector CT imaging of the abdomen and pelvis was performed following the standard protocol without IV contrast. RADIATION DOSE REDUCTION: This exam was performed according to the departmental dose-optimization program which includes automated exposure control, adjustment of the mA and/or kV according to patient size and/or use of iterative reconstruction technique. COMPARISON:  Noncontrast CT Abdomen and Pelvis 11/30/2020, 01/12/2019. pelvis ultrasound 01/18/2021. FINDINGS: Lower chest: Chronic cardiomegaly and cardiac pacemaker. No pericardial or pleural effusion. But increased streaky peribronchial lung base opacity since last year. No consolidation. Hepatobiliary: Chronic cholelithiasis. No gallbladder inflammation is evident. Negative noncontrast liver. Pancreas: Negative. Spleen: Negative. Adrenals/Urinary Tract: Stable. Nonobstructed kidneys with nephrolithiasis and renal vascular calcifications. Pelvic phleboliths. Unremarkable bladder. Stomach/Bowel: No dilated large or small bowel. Redundant although mostly decompressed large bowel throughout the abdomen and pelvis. Redundant ascending colon. Small fecalith but otherwise normal appendix (series 3, images 56 and 57). Stomach and duodenum are also fairly decompressed. No free air or free fluid. No convincing mesenteric inflammation. Chronic ventral abdominal muscle diastasis. Vascular/Lymphatic: Severe Aortoiliac calcified atherosclerosis. Normal caliber abdominal aorta. Chronic iliac arterial and venous stents. Vascular patency is not evaluated in the absence of IV contrast. No lymphadenopathy identified. Reproductive: Slowly enlarging cystic right ovarian lesion, up to 5.2 cm long axis last year and now 6.9 cm (series 3, image 73) with continued internal fluid density although slightly more heterogeneous CT appearance now. Uterus and left ovary  remain negative. Other: No pelvic free fluid. Musculoskeletal: Diffuse osteopenia. Chronic L2 through L4 compression fractures appear stable. Other visible vertebrae appear stable and intact. Prior sternotomy. No acute osseous abnormality identified. IMPRESSION: 1. Enlarging cystic right ovarian lesion, characterized by ultrasound in January as suspicious for cystic neoplasm and now 6.9 cm (5.2 cm last year). Recommend GYN Surgery consultation if not already done. 2. No other acute or inflammatory process identified in the non-contrast abdomen or pelvis. 3. Cholelithiasis. Nephrolithiasis. Advanced Aortic Atherosclerosis (ICD10-I70.0). Cardiomegaly. Osteopenia with chronic L2 through L4 compression fractures. Electronically Signed   By: Genevie Ann M.D.   On:  08/16/2021 06:16   DG Chest Portable 1 View  Result Date: 08/16/2021 CLINICAL DATA:  Shortness of breath and weakness. EXAM: PORTABLE CHEST 1 VIEW COMPARISON:  01/12/2019 FINDINGS: Low lung volumes. Patient is rotated. Prior median sternotomy. Left-sided pacemaker in place. Chronic cardiomegaly. Bronchovascular crowding with likely vascular congestion. Suspected small pleural effusions. Bibasilar atelectasis. No pneumothorax. IMPRESSION: Low lung volumes with bronchovascular crowding and likely vascular congestion. Suspected small pleural effusions. Findings may be due to CHF. Electronically Signed   By: Keith Rake M.D.   On: 08/16/2021 02:44   MM 3D SCREEN BREAST BILATERAL  Result Date: 08/01/2021 CLINICAL DATA:  Screening. EXAM: DIGITAL SCREENING BILATERAL MAMMOGRAM WITH TOMOSYNTHESIS AND CAD TECHNIQUE: Bilateral screening digital craniocaudal and mediolateral oblique mammograms were obtained. Bilateral screening digital breast tomosynthesis was performed. The images were evaluated with computer-aided detection. COMPARISON:  Previous exam(s). ACR Breast Density Category b: There are scattered areas of fibroglandular density. FINDINGS: There are no  findings suspicious for malignancy. IMPRESSION: No mammographic evidence of malignancy. A result letter of this screening mammogram will be mailed directly to the patient. RECOMMENDATION: Screening mammogram in one year. (Code:[id number]) BI-RADS CATEGORY  1: Negative. Electronically Signed   By: Lajean Manes M.D.   On: 08/01/2021 15:00   CUP PACEART REMOTE DEVICE CHECK  Result Date: 07/26/2021 Scheduled remote reviewed. Normal device function.  Next remote 91 days. [place]   Microbiology: Results for orders placed or performed during the hospital encounter of 08/16/21  SARS Coronavirus 2 by RT PCR (hospital order, performed in [facility] hospital lab) *cepheid single result test* Anterior Nasal Swab     Status: Abnormal   Collection Time: 08/16/21  3:57 AM   Specimen: Anterior Nasal Swab  Result Value Ref Range Status   SARS Coronavirus 2 by RT PCR POSITIVE (A) NEGATIVE Final    Comment: (NOTE) SARS-CoV-2 target nucleic acids are DETECTED  SARS-CoV-2 RNA is generally detectable in upper respiratory specimens  during the acute phase of infection.  Positive results are indicative  of the presence of the identified virus, but do not rule out bacterial infection or co-infection with other pathogens not detected by the test.  Clinical correlation with patient history and  other diagnostic information is necessary to determine patient infection status.  The expected result is negative.  Fact Sheet for Patients:   https://www.patel.info/   Fact Sheet for Healthcare Providers:   https://hall.com/    This test is not yet approved or cleared by the [place] FDA and  has been authorized for detection and/or diagnosis of SARS-CoV-2 by FDA under an Emergency Use Authorization (EUA).  This EUA will remain in effect (meaning this test can be used) for the duration of  the COVID-19 declaration under Section 564(b)(1)  of the Act, 21 U.S.C. section  360-bbb-3(b)(1), unless the authorization is terminated or revoked sooner.   Performed at [facility], [place] [address], [place], [place] [address]   Urine Culture     Status: Abnormal   Collection Time: 08/16/21  2:59 PM   Specimen: Urine, Clean Catch  Result Value Ref Range Status   Specimen Description URINE, CLEAN CATCH  Final   Special Requests   Final    NONE Performed at [facility], [place] [address], [place], [place] [address]    Culture MULTIPLE SPECIES PRESENT, SUGGEST RECOLLECTION (A)  Final   Report Status 08/18/2021 FINAL  Final    Labs: CBC: Recent Labs  Lab 08/16/21 0134 08/17/21 0432 08/18/21  6812 08/19/21 0755 08/20/21 0258  WBC 4.5 4.2 3.1* 2.5* 3.0*  NEUTROABS 3.1 1.9 1.2* 0.5* 0.7*  HGB 15.2* 13.6 13.6 13.9 14.4  HCT 46.7* 41.2 40.5 43.1 43.2  MCV 98.5 98.6 97.8 98.4 96.4  PLT 127* 94* 86* 83* 82*   Basic Metabolic Panel: Recent Labs  Lab 08/16/21 0134 08/16/21 1300 08/17/21 0432 08/18/21 0415 08/19/21 0755 08/20/21 0258  NA 138  --   --  136 139  --   K 3.8  --   --  4.1 4.2  --   CL 106  --   --  107 108  --   CO2 20*  --   --  22 26  --   GLUCOSE 165*  --   --  154* 189*  --   BUN 25*  --   --  9 9  --   CREATININE 1.60*  --   --  0.93 0.85  --   CALCIUM 9.4  --   --  8.9 9.0  --   MG  --  2.0 1.8 1.8 1.7 1.7  PHOS  --  3.8 2.8 2.1* 2.3* 2.2*   Liver Function Tests: Recent Labs  Lab 08/16/21 0134 08/18/21 0415 08/19/21 0755  AST 53* 35 30  ALT 40 30 28  ALKPHOS 70 62 70  BILITOT 0.6 0.8 0.7  PROT 7.8 6.4* 6.9  ALBUMIN 3.5 2.8* 2.9*   CBG: Recent Labs  Lab 08/19/21 1149 08/19/21 1656 08/19/21 2121 08/20/21 0745 08/20/21 1147  GLUCAP 249* 250* 239* 192* 246*    Discharge time spent: less than 30 minutes.  Signed: Marylu Lund, MD [facility] 08/20/2021

## 2021-08-21 NOTE — Progress Notes (Signed)
Remote ICD transmission.   

## 2021-08-22 LAB — PATHOLOGIST SMEAR REVIEW

## 2021-08-28 ENCOUNTER — Ambulatory Visit (INDEPENDENT_AMBULATORY_CARE_PROVIDER_SITE_OTHER): Payer: Medicare HMO

## 2021-08-28 DIAGNOSIS — Z95810 Presence of automatic (implantable) cardiac defibrillator: Secondary | ICD-10-CM

## 2021-08-28 DIAGNOSIS — I5042 Chronic combined systolic (congestive) and diastolic (congestive) heart failure: Secondary | ICD-10-CM

## 2021-09-01 NOTE — Progress Notes (Signed)
EPIC Encounter for ICM Monitoring  Patient Name: [name] is a 71 y.o. female Date: 09/01/2021 Primary Care Physican: Everardo Beals, NP Primary Cardiologist: Skains/Weaver PA Electrophysiologist: Quentin Ore 04/18/2021 Weight: 226 lbs 05/22/2021 Office Weight: 245 lbs 07/26/2021 Weight: 240 lbs   AT/AF Burden: <1% (taking Eliquis)                                                          Spoke with patient and heart failure questions reviewed.  Pt asymptomatic for fluid accumulation.  She had vomiting and diarrhea which caused dehydration earlier this month.     CorVue thoracic impedance normal but was suggesting possible dryness from 8/7-8/9 and was admitted to hospital with dx dehydration and possible fluid accumulation from 8/9-8/13.   Prescribed: Furosemide 40 mg take 1 tablet daily Potassium 20 mEq take 1 tablet daily   Labs: 04/28/2021 10.5, BUN 27, Potassium 4.0, Sodium 140, GFR 53.49 A complete set of results can be found in Results Review.   Recommendations:  No changes and encouraged to call if experiencing any fluid symptoms.   Next ICM clinic phone appointment due: 10/02/2021.     Next 91 day remote transmission due: 10/23/2021.     EP/Cardiology Office Visits:  Recall 01/26/2022 with Dr Quentin Ore.   Copy of ICM check sent to Dr. Quentin Ore (replacing Dr Rayann Heman).   3 month ICM trend: 08/29/2021.    12-14 Month ICM trend:     [name], RN 09/01/2021 10:47 AM

## 2021-09-20 NOTE — Progress Notes (Signed)
Cardiology Office Note:    Date:  09/27/2021   ID:  [name], DOB 11-10-1950, MRN [id number]  PCP:  Everardo Beals, NP  [place] Prov[facility] Cardiologist:  Candee Furbish, MD Electrophysiologist:  Vickie Epley, MD     Referring MD: Everardo Beals, NP   Chief Complaint:  Follow-up     History of Present Illness:   [name] is a 71 y.o. female with with history of CAD (CABG 2007), ICM, chronic CHF (systolic), DM (w/hx of DKA), DVT, HTN, HLD, LBBB, obesity, OSA (w/O2 at [facility]), VT, ICD.   Patient last saw Dr. Marlou Porch 02/2021 and was stable.  Patient had admission 08/2021 with dehydration, N/V covid 19 infection.  Patient comes in alone, lives with her son. Walks with a walker b/c of knee problems. Doing PT at home twice a week otherwise no exercise. Can alk 100 steps. Denies chest pain, dyspnea, palpitations. Occasional swelling treated with a compression stockings. No bleeding problems on eliquis, no shocks from defib.            Past Medical History:  Diagnosis Date   Abnormal LFTs    AICD (automatic cardioverter/defibrillator) present    Angina decubitus ([place]) 11/06/2012   Angina pectoris ([place])    Anxiety    Arthritis    Back pain    Bundle branch block 05/2016   CAD (coronary artery disease) 06/18/2010   CHF (congestive heart failure) (HCC)    Chronic combined systolic and diastolic CHF, NYHA class 3 (HCC)    Chronic combined systolic and diastolic heart failure ([place])    Chronic systolic dysfunction of left ventricle 09/24/2012   Coronary artery disease    s/p CABG 2007   Depression    Dermal mycosis    Diabetes mellitus    type II   DKA (diabetic ketoacidoses) 01/12/2019   DVT (deep venous thrombosis) ([place]) 09/2015   bilateral   Edema, lower extremity    Glaucoma    Hyperlipidemia    Hypertension    Hypoglycemia 09/09/2012   Insomnia    Insulin dependent diabetes mellitus ([place]) 04/23/2012   Ischemic cardiomyopathy    EF 30-35%, s/p  ICD 4/08 by Dr Leonia Reeves   Joint pain    LBBB (left bundle branch block) 09/24/2012   Morbid obesity ([place]) 11/06/2012   Obesity    Oxygen dependent 2 L   Pain in left knee    Peripheral neuropathy    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   LIMA to D2, SVG to ramus intermediate, sequential SVG to OM1-OM2, SVG to RCA with EVH via both legs    Sleep apnea    uses O2 at night   Tinea    Type 2 diabetes mellitus with hyperglycemia, with long-term current use of insulin ([place]) 04/20/2019   Uncontrolled diabetes mellitus 02/2019   Ventricular tachycardia ([place]) 01/16/15   sustained VT terminated with ATP, CL 340 msec   Vitamin B 12 deficiency 11/05/2018   Vitamin D deficiency 11/05/2018   Current Medications: Current Meds  Medication Sig   albuterol (PROVENTIL) (2.5 MG/3ML) 0.083% nebulizer solution Take 2.5 mg by nebulization every 6 (six) hours as needed for wheezing or shortness of breath.    Alcohol Swabs (ALCOHOL WIPES) 70 % PADS DropSafe Alcohol Prep Pads  USE WHEN CHECKING BLOOD SUGARS   amitriptyline (ELAVIL) 10 MG tablet Take 10 mg by mouth at bedtime.   apixaban (ELIQUIS) 5 MG TABS tablet Take 1 tablet (5 mg total) by mouth  2 (two) times daily.   Baclofen 5 MG TABS Take 5 mg by mouth daily.   carvedilol (COREG) 6.25 MG tablet Take 1 tablet (6.25 mg total) by mouth 2 (two) times daily with a meal.   Continuous Blood Gluc Sensor (DEXCOM G6 SENSOR) MISC 1 Device by Does not apply route as directed.   Continuous Blood Gluc Transmit (DEXCOM G6 TRANSMITTER) MISC 1 Device by Does not apply route as directed.   cyanocobalamin (,VITAMIN B-12,) 1000 MCG/ML injection 1,000 mcg every 30 (thirty) days.   dapagliflozin propanediol (FARXIGA) 10 MG TABS tablet Take 10 mg by mouth daily.   DROPLET PEN NEEDLES 32G X 4 MM MISC USE IN THE MORNING, AT NOON, IN THE EVENING, AND AT BEDTIME.   ferrous sulfate 325 (65 FE) MG EC tablet Take 325 mg by mouth every other day.   furosemide (LASIX) 40 MG tablet TAKE 1 TABLET EVERY  DAY   gabapentin (NEURONTIN) 300 MG capsule Take 300 mg by mouth at bedtime.   isosorbide mononitrate (IMDUR) 30 MG 24 hr tablet Take 30 mg by mouth daily.   latanoprost (XALATAN) 0.005 % ophthalmic solution Administer 1 drop to both eyes nightly.   lisinopril (ZESTRIL) 2.5 MG tablet Take 1 tablet by mouth daily.   meloxicam (MOBIC) 15 MG tablet Take 1 tablet by mouth daily.   methocarbamol (ROBAXIN) 500 MG tablet Take 1 tablet (500 mg total) by mouth 2 (two) times daily as needed for muscle spasms.   nitroGLYCERIN (NITROSTAT) 0.4 MG SL tablet Place 0.4 mg under the tongue every 5 (five) minutes as needed for chest pain.   NOVOLOG FLEXPEN 100 UNIT/ML FlexPen Max daily 30 units per scale (Patient taking differently: 6-12 Units daily as needed for high blood sugar. Max daily 30 units per scale. Sliding scale)   potassium chloride SA (KLOR-CON) 20 MEQ tablet Take 20 mEq by mouth daily as needed (for low pottassium).   rosuvastatin (CRESTOR) 20 MG tablet Take 1 tablet (20 mg total) by mouth daily at 6 PM.   traMADol (ULTRAM) 50 MG tablet Take 50 mg by mouth daily.   TRESIBA FLEXTOUCH 100 UNIT/ML FlexTouch Pen INJECT 24 UNITS INTO THE SKIN DAILY. (Patient taking differently: Inject 26 Units into the skin daily.)   Vitamin D, Ergocalciferol, (DRISDOL) 1.25 MG (50000 UT) CAPS capsule Take 1 capsule (50,000 Units total) by mouth every 7 (seven) days.    Allergies:   Metformin and related, Ozempic (0.25 or 0.5 mg-dose) [semaglutide(0.25 or 0.12m-dos)], Rosuvastatin, Atorvastatin calcium, and Levofloxacin   Social History   Tobacco Use   Smoking status: Never   Smokeless tobacco: Never  Vaping Use   Vaping Use: Never used  Substance Use Topics   Alcohol use: No   Drug use: No    Family Hx: The patient's family history includes Diabetes (age of onset: 669 in her mother; High blood pressure in her mother; Obesity in her mother; Other in an other family member; Stroke in her mother; Sudden death in  her mother.  ROS   EKGs/Labs/Other Test Reviewed:    EKG:  EKG is  not ordered today.     Recent Labs: 08/16/2021: B Natriuretic Peptide 103.5 08/19/2021: ALT 28; BUN 9; Creatinine, Ser 0.85; Potassium 4.2; Sodium 139 08/20/2021: Hemoglobin 14.4; Magnesium 1.7; Platelets 82   Recent Lipid Panel Recent Labs    04/28/21 1003  CHOL 125  TRIG 78.0  HDL 44.20  VLDL 15.6  LDLCALC 65     Prior CV Studies:  Device information Abbott ICD implanted 05/01/2006 >> attempts at CRT upgrade and gen change 09/25/2012  (No suitable veins for LV lead) CRT device, though LV port is plugged + appropriate therapy 01/18/2019, VF 01/19/2019: LHC Severe diffuse three-vessel coronary disease with functional total occlusion of the mid LAD, total occlusion of the large first diagonal, total occlusion of the proximal ramus intermedius, total occlusion of the proximal circumflex, and total occlusion of the mid RCA.  All vessels are heavily calcified. Patent saphenous vein graft to the PDA.  Moderate diffuse disease beyond the graft insertion site Patent sequential saphenous vein graft to the first and second obtuse marginal.  Diffuse native vessel disease beyond the bypass graft insertion site. Patent saphenous vein graft to the ramus intermedius.  Diffuse native vessel disease beyond the graft insertion site. Patent left internal mammary graft to the dominant diagonal.  Diffuse native vessel disease beyond the graft insertion site. Chronic systolic heart failure with EF less than 35% and LVEDP 22 mmHg.     01/13/2019: TTE IMPRESSIONS   1. Suboptimal image quality for diagnosis of biventricular function and  wall motion.   2. Left ventricular ejection fraction, by visual estimation, is 50%. The  left ventricle has limited views for evaluation of function. Possible  apical wall motion abnormality. There is mildly increased left ventricular  hypertrophy.   3. Global right ventricle was not well  visualized.The right ventricular  size is not well visualized. Right vetricular wall thickness was not  assessed.   4. Left atrial size was not well visualized.   5. Right atrial size was not well visualized.   6. Mild mitral annular calcification.   7. The mitral valve is abnormal. No evidence of mitral valve  regurgitation. No evidence of mitral stenosis.   8. The tricuspid valve is not well visualized.   9. The aortic valve was not well visualized. Aortic valve regurgitation  is not visualized. No evidence of aortic valve sclerosis or stenosis.       Risk Assessment/Calculations/Metrics:              Physical Exam:    VS:  BP 128/72   Pulse 63   Ht 5' 2"  (1.575 m)   Wt 241 lb (109.3 kg)   SpO2 97%   BMI 44.08 kg/m     Wt Readings from Last 3 Encounters:  09/27/21 241 lb (109.3 kg)  08/19/21 242 lb 8.1 oz (110 kg)  04/28/21 245 lb (111.1 kg)    Physical Exam  GEN: Obese, in no acute distress  Neck: bilateral carotid dopplers no JVD,or masses Cardiac:RRR; 1/6 systolic murmur LSB Respiratory:  clear to auscultation bilaterally, normal work of breathing GI: soft, nontender, nondistended, + BS Ext: without cyanosis, clubbing, or edema, Good distal pulses bilaterally Neuro:  Alert and Oriented x 3,  Psych: euthymic mood, full affect       ASSESSMENT & PLAN:   No problem-specific Assessment & Plan notes found for this encounter.   CAD CABG 2007, cath 2021 Patent grafts medical therapy-no angina, sedentary due to knee problems.  ICM improved LVEF 50% eho 01/2019 compensated. On coreg, lasix, Imdur, zestril, K. Labs in August stable  Chronic CHF monitored by CorVue throacic impedance  HTN BP well controlled  HLD LDL 65 04/2021 on crestor  Obesity-very sedentary  OSA on O2 at night  ICD-schedule visit with Dr. Quentin Ore to establish  Carotid bruits-check dopplers.  DM2-patient says most recent A1C in the high 7 range.  History of DVT on  Eliquis  Dispo:  No follow-ups on file.   Medication Adjustments/Labs and Tests Ordered: Current medicines are reviewed at length with the patient today.  Concerns regarding medicines are outlined above.  Tests Ordered: Orders Placed This Encounter  Procedures   VAS US CAROTID   Medication Changes: No orders of the defined types were placed in this encounter.  Signed, Ermalinda Barrios, PA-C  09/27/2021 10:14 AM    [address], [place],   [id number] Phone: [phone]; Fax: [phone]

## 2021-09-27 ENCOUNTER — Encounter: Payer: Self-pay | Admitting: Physician Assistant

## 2021-09-27 ENCOUNTER — Ambulatory Visit: Payer: Medicare HMO | Attending: Physician Assistant | Admitting: Physician Assistant

## 2021-09-27 VITALS — BP 128/72 | HR 63 | Ht 62.0 in | Wt 241.0 lb

## 2021-09-27 DIAGNOSIS — I255 Ischemic cardiomyopathy: Secondary | ICD-10-CM

## 2021-09-27 DIAGNOSIS — I10 Essential (primary) hypertension: Secondary | ICD-10-CM | POA: Diagnosis not present

## 2021-09-27 DIAGNOSIS — I2510 Atherosclerotic heart disease of native coronary artery without angina pectoris: Secondary | ICD-10-CM

## 2021-09-27 DIAGNOSIS — E1165 Type 2 diabetes mellitus with hyperglycemia: Secondary | ICD-10-CM

## 2021-09-27 DIAGNOSIS — Z794 Long term (current) use of insulin: Secondary | ICD-10-CM

## 2021-09-27 DIAGNOSIS — I447 Left bundle-branch block, unspecified: Secondary | ICD-10-CM

## 2021-09-27 DIAGNOSIS — Z86718 Personal history of other venous thrombosis and embolism: Secondary | ICD-10-CM

## 2021-09-27 DIAGNOSIS — R0989 Other specified symptoms and signs involving the circulatory and respiratory systems: Secondary | ICD-10-CM

## 2021-09-27 DIAGNOSIS — I519 Heart disease, unspecified: Secondary | ICD-10-CM | POA: Diagnosis not present

## 2021-09-27 DIAGNOSIS — E785 Hyperlipidemia, unspecified: Secondary | ICD-10-CM

## 2021-09-27 DIAGNOSIS — G4730 Sleep apnea, unspecified: Secondary | ICD-10-CM

## 2021-09-27 NOTE — Patient Instructions (Signed)
Medication Instructions:  Your physician recommends that you continue on your current medications as directed. Please refer to the Current Medication list given to you today.  *If you need a refill on your cardiac medications before your next appointment, please call your pharmacy*   Lab Work: None If you have labs (blood work) drawn today and your tests are completely normal, you will receive your results only by: [place] (if you have MyChart) OR A paper copy in the mail If you have any lab test that is abnormal or we need to change your treatment, we will call you to review the results.   Testing/Procedures: Your physician has requested that you have a carotid duplex. This test is an ultrasound of the carotid arteries in your neck. It looks at blood flow through these arteries that supply the brain with blood. Allow one hour for this exam. There are no restrictions or special instructions.   Follow-Up: At [facility], you and your health needs are our priority.  As part of our continuing mission to provide you with exceptional heart care, we have created designated Provider Care Teams.  These Care Teams include your primary Cardiologist (physician) and Advanced Practice Providers (APPs -  Physician Assistants and Nurse Practitioners) who all work together to provide you with the care you need, when you need it.  We recommend signing up for the patient portal called "MyChart".  Sign up information is provided on this After Visit Summary.  MyChart is used to connect with patients for Virtual Visits (Telemedicine).  Patients are able to view lab/test results, encounter notes, upcoming appointments, etc.  Non-urgent messages can be sent to your provider as well.   To learn more about what you can do with MyChart, go to [email or web address].    Your next appointment:   Dr. Quentin Ore in January (you can schedule from recall) Dr. Marlou Porch in February

## 2021-09-28 ENCOUNTER — Other Ambulatory Visit: Payer: Self-pay | Admitting: Internal Medicine

## 2021-10-02 ENCOUNTER — Ambulatory Visit (INDEPENDENT_AMBULATORY_CARE_PROVIDER_SITE_OTHER): Payer: Medicare HMO

## 2021-10-02 DIAGNOSIS — I5042 Chronic combined systolic (congestive) and diastolic (congestive) heart failure: Secondary | ICD-10-CM | POA: Diagnosis not present

## 2021-10-02 DIAGNOSIS — Z95810 Presence of automatic (implantable) cardiac defibrillator: Secondary | ICD-10-CM

## 2021-10-04 NOTE — Progress Notes (Signed)
EPIC Encounter for ICM Monitoring  Patient Name: [name] is a 71 y.o. female Date: 10/04/2021 Primary Care Physican: Everardo Beals, NP Primary Cardiologist: Skains/Weaver PA Electrophysiologist: Quentin Ore 04/18/2021 Weight: 226 lbs 05/22/2021 Office Weight: 245 lbs 07/26/2021 Weight: 240 lbs 10/04/2021 Weight: 237 lbs   AT/AF Burden: <1% (taking Eliquis)                                                          Spoke with patient and heart failure questions reviewed.  Pt asymptomatic for fluid accumulation.  She reports she is feeling fine.    CorVue thoracic impedance suggesting normal fluid levels.   Prescribed: Furosemide 40 mg take 1 tablet daily Potassium 20 mEq take 1 tablet daily   Labs: 04/28/2021 10.5, BUN 27, Potassium 4.0, Sodium 140, GFR 53.49 A complete set of results can be found in Results Review.   Recommendations:  No changes and encouraged to call if experiencing any fluid symptoms.   Next ICM clinic phone appointment due: 11/06/2021.     Next 91 day remote transmission due: 10/23/2021.     EP/Cardiology Office Visits:  Recall 01/26/2022 with Dr Quentin Ore.   Copy of ICM check sent to Dr. Quentin Ore.    3 month ICM trend: 10/02/2021.    12-14 Month ICM trend:     [name], RN 10/04/2021 3:50 PM

## 2021-10-10 ENCOUNTER — Other Ambulatory Visit: Payer: Self-pay | Admitting: Internal Medicine

## 2021-10-19 ENCOUNTER — Encounter: Payer: Self-pay | Admitting: Cardiology

## 2021-10-19 NOTE — Telephone Encounter (Signed)
error 

## 2021-10-23 ENCOUNTER — Ambulatory Visit (INDEPENDENT_AMBULATORY_CARE_PROVIDER_SITE_OTHER): Payer: Medicare HMO

## 2021-10-23 DIAGNOSIS — I5042 Chronic combined systolic (congestive) and diastolic (congestive) heart failure: Secondary | ICD-10-CM | POA: Diagnosis not present

## 2021-10-24 ENCOUNTER — Ambulatory Visit (HOSPITAL_COMMUNITY)
Admission: RE | Admit: 2021-10-24 | Discharge: 2021-10-24 | Disposition: A | Discharge status: Home / Self Care | Payer: Medicare HMO | Source: Ambulatory Visit | Attending: Physician Assistant | Admitting: Physician Assistant

## 2021-10-24 DIAGNOSIS — R0989 Other specified symptoms and signs involving the circulatory and respiratory systems: Secondary | ICD-10-CM | POA: Diagnosis present

## 2021-10-24 LAB — CUP PACEART REMOTE DEVICE CHECK
Battery Remaining Longevity: 13 mo
Battery Remaining Percentage: 13 %
Battery Voltage: 2.71 V
Brady Statistic AP VP Percent: 1 %
Brady Statistic AP VS Percent: 7.3 %
Brady Statistic AS VP Percent: 1 %
Brady Statistic AS VS Percent: 93 %
Brady Statistic RA Percent Paced: 6.7 %
Brady Statistic RV Percent Paced: 1 %
Date Time Interrogation Session: 20231017044317
HighPow Impedance: 43 Ohm
HighPow Impedance: 43 Ohm
Implantable Lead Implant Date: 20080423
Implantable Lead Implant Date: 20080423
Implantable Lead Implant Date: 20140918
Implantable Lead Location: 753858
Implantable Lead Location: 753859
Implantable Lead Location: 753860
Implantable Lead Model: 5076
Implantable Lead Model: 6947
Implantable Pulse Generator Implant Date: 20140918
Lead Channel Impedance Value: 430 Ohm
Lead Channel Impedance Value: 510 Ohm
Lead Channel Pacing Threshold Amplitude: 0.5 V
Lead Channel Pacing Threshold Amplitude: 0.75 V
Lead Channel Pacing Threshold Pulse Width: 0.5 ms
Lead Channel Pacing Threshold Pulse Width: 0.5 ms
Lead Channel Sensing Intrinsic Amplitude: 1.3 mV
Lead Channel Sensing Intrinsic Amplitude: 12 mV
Lead Channel Setting Pacing Amplitude: 2 V
Lead Channel Setting Pacing Amplitude: 2.5 V
Lead Channel Setting Pacing Pulse Width: 0.5 ms
Lead Channel Setting Sensing Sensitivity: 0.5 mV
Pulse Gen Serial Number: 7070007

## 2021-10-29 ENCOUNTER — Emergency Department (HOSPITAL_COMMUNITY): Payer: Medicare HMO

## 2021-10-29 ENCOUNTER — Inpatient Hospital Stay (HOSPITAL_COMMUNITY)
Admission: EM | Admit: 2021-10-29 | Discharge: 2021-10-31 | DRG: 682 | Disposition: A | Discharge status: Home / Self Care | Payer: Medicare HMO | Attending: Internal Medicine | Admitting: Internal Medicine

## 2021-10-29 ENCOUNTER — Encounter (HOSPITAL_COMMUNITY): Payer: Self-pay

## 2021-10-29 ENCOUNTER — Other Ambulatory Visit: Payer: Self-pay

## 2021-10-29 DIAGNOSIS — N179 Acute kidney failure, unspecified: Principal | ICD-10-CM | POA: Diagnosis present

## 2021-10-29 DIAGNOSIS — I2510 Atherosclerotic heart disease of native coronary artery without angina pectoris: Secondary | ICD-10-CM | POA: Diagnosis present

## 2021-10-29 DIAGNOSIS — E1142 Type 2 diabetes mellitus with diabetic polyneuropathy: Secondary | ICD-10-CM

## 2021-10-29 DIAGNOSIS — Z86718 Personal history of other venous thrombosis and embolism: Secondary | ICD-10-CM

## 2021-10-29 DIAGNOSIS — Z881 Allergy status to other antibiotic agents status: Secondary | ICD-10-CM

## 2021-10-29 DIAGNOSIS — N83201 Unspecified ovarian cyst, right side: Secondary | ICD-10-CM | POA: Diagnosis present

## 2021-10-29 DIAGNOSIS — I255 Ischemic cardiomyopathy: Secondary | ICD-10-CM | POA: Diagnosis present

## 2021-10-29 DIAGNOSIS — Z8249 Family history of ischemic heart disease and other diseases of the circulatory system: Secondary | ICD-10-CM

## 2021-10-29 DIAGNOSIS — D259 Leiomyoma of uterus, unspecified: Secondary | ICD-10-CM | POA: Diagnosis present

## 2021-10-29 DIAGNOSIS — Z7982 Long term (current) use of aspirin: Secondary | ICD-10-CM

## 2021-10-29 DIAGNOSIS — Z79899 Other long term (current) drug therapy: Secondary | ICD-10-CM

## 2021-10-29 DIAGNOSIS — Z888 Allergy status to other drugs, medicaments and biological substances status: Secondary | ICD-10-CM

## 2021-10-29 DIAGNOSIS — N9489 Other specified conditions associated with female genital organs and menstrual cycle: Secondary | ICD-10-CM | POA: Diagnosis present

## 2021-10-29 DIAGNOSIS — J9611 Chronic respiratory failure with hypoxia: Secondary | ICD-10-CM | POA: Diagnosis present

## 2021-10-29 DIAGNOSIS — L89899 Pressure ulcer of other site, unspecified stage: Secondary | ICD-10-CM | POA: Diagnosis present

## 2021-10-29 DIAGNOSIS — E861 Hypovolemia: Secondary | ICD-10-CM | POA: Diagnosis present

## 2021-10-29 DIAGNOSIS — Z794 Long term (current) use of insulin: Secondary | ICD-10-CM

## 2021-10-29 DIAGNOSIS — E785 Hyperlipidemia, unspecified: Secondary | ICD-10-CM | POA: Diagnosis present

## 2021-10-29 DIAGNOSIS — E860 Dehydration: Secondary | ICD-10-CM | POA: Diagnosis present

## 2021-10-29 DIAGNOSIS — Z833 Family history of diabetes mellitus: Secondary | ICD-10-CM

## 2021-10-29 DIAGNOSIS — R8271 Bacteriuria: Secondary | ICD-10-CM | POA: Diagnosis present

## 2021-10-29 DIAGNOSIS — I5042 Chronic combined systolic (congestive) and diastolic (congestive) heart failure: Secondary | ICD-10-CM | POA: Diagnosis present

## 2021-10-29 DIAGNOSIS — I959 Hypotension, unspecified: Secondary | ICD-10-CM | POA: Diagnosis present

## 2021-10-29 DIAGNOSIS — Z1152 Encounter for screening for COVID-19: Secondary | ICD-10-CM

## 2021-10-29 DIAGNOSIS — Z791 Long term (current) use of non-steroidal anti-inflammatories (NSAID): Secondary | ICD-10-CM

## 2021-10-29 DIAGNOSIS — E119 Type 2 diabetes mellitus without complications: Secondary | ICD-10-CM | POA: Diagnosis present

## 2021-10-29 DIAGNOSIS — J440 Chronic obstructive pulmonary disease with acute lower respiratory infection: Secondary | ICD-10-CM | POA: Diagnosis present

## 2021-10-29 DIAGNOSIS — F32A Depression, unspecified: Secondary | ICD-10-CM | POA: Diagnosis present

## 2021-10-29 DIAGNOSIS — Z951 Presence of aortocoronary bypass graft: Secondary | ICD-10-CM

## 2021-10-29 DIAGNOSIS — Z9981 Dependence on supplemental oxygen: Secondary | ICD-10-CM

## 2021-10-29 DIAGNOSIS — E8721 Acute metabolic acidosis: Secondary | ICD-10-CM | POA: Diagnosis present

## 2021-10-29 DIAGNOSIS — I447 Left bundle-branch block, unspecified: Secondary | ICD-10-CM | POA: Diagnosis present

## 2021-10-29 DIAGNOSIS — Z7901 Long term (current) use of anticoagulants: Secondary | ICD-10-CM

## 2021-10-29 DIAGNOSIS — I110 Hypertensive heart disease with heart failure: Secondary | ICD-10-CM | POA: Diagnosis present

## 2021-10-29 DIAGNOSIS — K529 Noninfective gastroenteritis and colitis, unspecified: Secondary | ICD-10-CM | POA: Diagnosis present

## 2021-10-29 DIAGNOSIS — Z6841 Body Mass Index (BMI) 40.0 and over, adult: Secondary | ICD-10-CM

## 2021-10-29 DIAGNOSIS — J189 Pneumonia, unspecified organism: Secondary | ICD-10-CM | POA: Diagnosis present

## 2021-10-29 DIAGNOSIS — N939 Abnormal uterine and vaginal bleeding, unspecified: Secondary | ICD-10-CM | POA: Diagnosis not present

## 2021-10-29 DIAGNOSIS — R112 Nausea with vomiting, unspecified: Secondary | ICD-10-CM | POA: Diagnosis present

## 2021-10-29 DIAGNOSIS — Z95810 Presence of automatic (implantable) cardiac defibrillator: Secondary | ICD-10-CM

## 2021-10-29 DIAGNOSIS — J441 Chronic obstructive pulmonary disease with (acute) exacerbation: Secondary | ICD-10-CM | POA: Diagnosis present

## 2021-10-29 DIAGNOSIS — E1165 Type 2 diabetes mellitus with hyperglycemia: Secondary | ICD-10-CM

## 2021-10-29 DIAGNOSIS — R55 Syncope and collapse: Secondary | ICD-10-CM | POA: Diagnosis present

## 2021-10-29 LAB — CBC
HCT: 49.6 % — ABNORMAL HIGH (ref 36.0–46.0)
HCT: 50.1 % — ABNORMAL HIGH (ref 36.0–46.0)
Hemoglobin: 16.1 g/dL — ABNORMAL HIGH (ref 12.0–15.0)
Hemoglobin: 16.5 g/dL — ABNORMAL HIGH (ref 12.0–15.0)
MCH: 31 pg (ref 26.0–34.0)
MCH: 31 pg (ref 26.0–34.0)
MCHC: 32.5 g/dL (ref 30.0–36.0)
MCHC: 32.9 g/dL (ref 30.0–36.0)
MCV: 95.4 fL (ref 80.0–100.0)
MCV: 94 fL (ref 80.0–100.0)
Platelets: 223 10*3/uL (ref 150–400)
Platelets: 106 10*3/uL — ABNORMAL LOW (ref 150–400)
RBC: 5.2 MIL/uL — ABNORMAL HIGH (ref 3.87–5.11)
RBC: 5.33 MIL/uL — ABNORMAL HIGH (ref 3.87–5.11)
RDW: 14.8 % (ref 11.5–15.5)
RDW: 15 % (ref 11.5–15.5)
WBC: 7 10*3/uL (ref 4.0–10.5)
WBC: 12.1 10*3/uL — ABNORMAL HIGH (ref 4.0–10.5)
nRBC: 0 % (ref 0.0–0.2)
nRBC: 0 % (ref 0.0–0.2)

## 2021-10-29 LAB — HEMOGLOBIN A1C
Hgb A1c MFr Bld: 7.7 % — ABNORMAL HIGH (ref 4.8–5.6)
Mean Plasma Glucose: 174.29 mg/dL

## 2021-10-29 LAB — CBG MONITORING, ED
Glucose-Capillary: 211 mg/dL — ABNORMAL HIGH (ref 70–99)
Glucose-Capillary: 133 mg/dL — ABNORMAL HIGH (ref 70–99)

## 2021-10-29 LAB — COMPREHENSIVE METABOLIC PANEL
ALT: 21 U/L (ref 0–44)
AST: 31 U/L (ref 15–41)
Albumin: 3.8 g/dL (ref 3.5–5.0)
Alkaline Phosphatase: 99 U/L (ref 38–126)
Anion gap: 14 (ref 5–15)
BUN: 29 mg/dL — ABNORMAL HIGH (ref 8–23)
CO2: 19 mmol/L — ABNORMAL LOW (ref 22–32)
Calcium: 10.1 mg/dL (ref 8.9–10.3)
Chloride: 102 mmol/L (ref 98–111)
Creatinine, Ser: 2.83 mg/dL — ABNORMAL HIGH (ref 0.44–1.00)
GFR, Estimated: 17 mL/min — ABNORMAL LOW (ref >60)
Glucose, Bld: 235 mg/dL — ABNORMAL HIGH (ref 70–99)
Potassium: 4.7 mmol/L (ref 3.5–5.1)
Sodium: 135 mmol/L (ref 135–145)
Total Bilirubin: 1.3 mg/dL — ABNORMAL HIGH (ref 0.3–1.2)
Total Protein: 8.6 g/dL — ABNORMAL HIGH (ref 6.5–8.1)

## 2021-10-29 LAB — URINALYSIS, MICROSCOPIC (REFLEX)

## 2021-10-29 LAB — URINALYSIS, ROUTINE W REFLEX MICROSCOPIC
Bilirubin Urine: NEGATIVE
Glucose, UA: >=500 mg/dL — AB
Ketones, ur: NEGATIVE mg/dL
Nitrite: NEGATIVE
Protein, ur: 100 mg/dL — AB
Specific Gravity, Urine: 1.015 (ref 1.005–1.030)
pH: 5 (ref 5.0–8.0)

## 2021-10-29 LAB — LACTIC ACID, PLASMA
Lactic Acid, Venous: 3.7 mmol/L (ref 0.5–1.9)
Lactic Acid, Venous: 2.8 mmol/L (ref 0.5–1.9)
Lactic Acid, Venous: 2.4 mmol/L (ref 0.5–1.9)

## 2021-10-29 LAB — TROPONIN I (HIGH SENSITIVITY)
Troponin I (High Sensitivity): 20 ng/L — ABNORMAL HIGH (ref <18)
Troponin I (High Sensitivity): 15 ng/L (ref <18)

## 2021-10-29 LAB — BETA-HYDROXYBUTYRIC ACID: Beta-Hydroxybutyric Acid: 0.14 mmol/L (ref 0.05–0.27)

## 2021-10-29 LAB — SARS CORONAVIRUS 2 BY RT PCR: SARS Coronavirus 2 by RT PCR: NEGATIVE

## 2021-10-29 LAB — GLUCOSE, CAPILLARY: Glucose-Capillary: 140 mg/dL — ABNORMAL HIGH (ref 70–99)

## 2021-10-29 LAB — LIPASE, BLOOD: Lipase: 42 U/L (ref 11–51)

## 2021-10-29 MED ORDER — ACETAMINOPHEN 650 MG RE SUPP: 650.0000 mg | Freq: Four times a day (QID) | RECTAL | Status: DC | PRN

## 2021-10-29 MED ORDER — VANCOMYCIN VARIABLE DOSE PER UNSTABLE RENAL FUNCTION (PHARMACIST DOSING): Status: DC

## 2021-10-29 MED ORDER — LORAZEPAM 2 MG/ML IJ SOLN
0.5000 mg | Freq: Once | INTRAMUSCULAR | Status: AC
  Administered 2021-10-29: 0.5 mg via INTRAVENOUS
  Filled 2021-10-29: qty 1

## 2021-10-29 MED ORDER — ROSUVASTATIN CALCIUM 20 MG PO TABS
20.0000 mg | ORAL_TABLET | Freq: Every day | ORAL | Status: DC
  Administered 2021-10-30: 20 mg via ORAL
  Filled 2021-10-29: qty 1

## 2021-10-29 MED ORDER — INSULIN ASPART 100 UNIT/ML IJ SOLN
0.0000 [IU] | INTRAMUSCULAR | Status: DC
  Administered 2021-10-29: 2 [IU] via SUBCUTANEOUS
  Administered 2021-10-30: 5 [IU] via SUBCUTANEOUS
  Administered 2021-10-30: 3 [IU] via SUBCUTANEOUS
  Administered 2021-10-30: 5 [IU] via SUBCUTANEOUS
  Administered 2021-10-30: 3 [IU] via SUBCUTANEOUS
  Administered 2021-10-31 (×2): 2 [IU] via SUBCUTANEOUS
  Administered 2021-10-31: 5 [IU] via SUBCUTANEOUS

## 2021-10-29 MED ORDER — LACTATED RINGERS IV SOLN: INTRAVENOUS | Status: AC

## 2021-10-29 MED ORDER — SODIUM CHLORIDE 0.9 % IV SOLN: 2.0000 g | INTRAVENOUS | Status: DC

## 2021-10-29 MED ORDER — LACTATED RINGERS IV BOLUS (SEPSIS)
1000.0000 mL | Freq: Once | INTRAVENOUS | Status: AC
  Administered 2021-10-29: 1000 mL via INTRAVENOUS

## 2021-10-29 MED ORDER — VANCOMYCIN HCL 2000 MG/400ML IV SOLN
2000.0000 mg | Freq: Once | INTRAVENOUS | Status: AC
  Administered 2021-10-29: 2000 mg via INTRAVENOUS
  Filled 2021-10-29: qty 400

## 2021-10-29 MED ORDER — SODIUM CHLORIDE 0.9 % IV SOLN
2.0000 g | Freq: Once | INTRAVENOUS | Status: AC
  Administered 2021-10-29: 2 g via INTRAVENOUS
  Filled 2021-10-29: qty 12.5

## 2021-10-29 MED ORDER — LACTATED RINGERS IV BOLUS
1000.0000 mL | Freq: Once | INTRAVENOUS | Status: AC
  Administered 2021-10-29: 1000 mL via INTRAVENOUS

## 2021-10-29 MED ORDER — ALBUTEROL SULFATE (2.5 MG/3ML) 0.083% IN NEBU: 2.5000 mg | INHALATION_SOLUTION | Freq: Four times a day (QID) | RESPIRATORY_TRACT | Status: DC | PRN

## 2021-10-29 MED ORDER — SODIUM CHLORIDE 0.9 % IV BOLUS
1000.0000 mL | Freq: Once | INTRAVENOUS | Status: AC
  Administered 2021-10-29: 1000 mL via INTRAVENOUS

## 2021-10-29 MED ORDER — ACETAMINOPHEN 325 MG PO TABS
650.0000 mg | ORAL_TABLET | Freq: Four times a day (QID) | ORAL | Status: DC | PRN
  Administered 2021-10-29: 650 mg via ORAL
  Filled 2021-10-29 (×2): qty 2

## 2021-10-29 MED ORDER — VANCOMYCIN HCL IN DEXTROSE 1-5 GM/200ML-% IV SOLN
1000.0000 mg | Freq: Once | INTRAVENOUS | Status: DC
  Filled 2021-10-29: qty 200

## 2021-10-29 MED ORDER — APIXABAN 5 MG PO TABS
5.0000 mg | ORAL_TABLET | Freq: Two times a day (BID) | ORAL | Status: DC
  Administered 2021-10-29: 5 mg via ORAL
  Filled 2021-10-29: qty 1

## 2021-10-29 NOTE — ED Notes (Signed)
Called 6N and requested purple man be initiated.

## 2021-10-29 NOTE — H&P (Signed)
Date: 10/29/2021               Patient Name:  [name] MRN: [id number]  DOB: 1950/07/01 Age / Sex: 71 y.o., female   PCP: Everardo Beals, NP         Medical Service: Internal Medicine Teaching Service         Attending Physician: Dr. Lucious Groves, DO    First Contact: Dr. Maurine Simmering  Pager: [phone]  Second Contact: Dr. Buddy Duty Pager: [phone]       After Hours (After 5p/  First Contact Pager: [phone]  weekends / holidays): Second Contact Pager: [phone]   Chief Complaint: nausea and vomiting  History of Present Illness: [name] is a 71 yo female with hypertension, CAD s/p 5x CABG in 2007, HLD, insulin dependent diabetes, CKD, COPD on 2 L [place] at baseline, who presented to the ED with nausea and vomiting that started this morning. She states that her emesis has been clear/yellow, denies any bilious or bloody emesis and states that she had about 3-4 episodes this morning. Of note, the patient has been feeling unwell for the last few days. She was having a cough and congestion the last few days and has not been at her baseline. Over this time period, she has had questionable fevers and chills.   Today after her multiple episodes of vomiting the patient became weak and felt like she was going to lose consciousness. He called EMS and upon EMS arrival, SBP was in the 70s and she was hypoxic. The patient states that she feels improved now, but still does not feel to be at her baseline. She denies any chest pain, SOB, abd pain, further episodes of vomiting, diarrhea, hematochezia, melena, or any urinary sxs at this time. Of note, the patient has a known "bed sore" per her son and daughter and that has been managed with wound care through her aid, although they have been told it is not acutely infected.    ED course: CBC with hemoconcentration, Hb of 16, normal WBC. BHB normal. Lactic acid level 3.7. Cr 2.83 (baseline 0.8). LFTs and lipase WNL. CXR with some  concern for airspace disease.  Received 2L fluids, BP in 113/70. Satting at 100% on 4 L [place].     Meds:  Eliquis 5 mg bid (blood clots) Coreg 6.25 mg bid Farxiga 10 mg daily  Lasix 40 mg daily Gabapentin 300 mg qhs Imdur 30 mg daily  Lisinopril 2.5 mg daily  Rosuvastatin 20 mg daily  Kclor 20 mEq every other day  Tramadol 50 mg daily for leg pain  Novolog SSI   No outpatient medications have been marked as taking for the 10/29/21 encounter [facility] Encounter).     Allergies: Allergies as of 10/29/2021 - Review Complete 10/29/2021  Allergen Reaction Noted   Metformin and related Itching, Swelling, and Other (See Comments) 12/27/2011   Ozempic (0.25 or 0.5 mg-dose) [semaglutide(0.25 or 0.'5mg'$ -dos)] Other (See Comments) 12/23/2020   Rosuvastatin  06/06/2021   Atorvastatin calcium Itching and Rash 03/11/2015   Levofloxacin Itching 05/26/2014   Past Medical History:  Diagnosis Date   Abnormal LFTs    AICD (automatic cardioverter/defibrillator) present    Angina decubitus 11/06/2012   Angina pectoris ([place])    Anxiety    Arthritis    Back pain    Bundle branch block 05/2016   CAD (coronary artery disease) 06/18/2010   CHF (congestive heart failure) (HCC)    Chronic  combined systolic and diastolic CHF, NYHA class 3 (HCC)    Chronic combined systolic and diastolic heart failure (HCC)    Chronic systolic dysfunction of left ventricle 09/24/2012   Coronary artery disease    s/p CABG 2007   Depression    Dermal mycosis    Diabetes mellitus    type II   DKA (diabetic ketoacidoses) 01/12/2019   DVT (deep venous thrombosis) ([place]) 09/2015   bilateral   Edema, lower extremity    Glaucoma    Hyperlipidemia    Hypertension    Hypoglycemia 09/09/2012   Insomnia    Insulin dependent diabetes mellitus ([place]) 04/23/2012   Ischemic cardiomyopathy    EF 30-35%, s/p ICD 4/08 by Dr Leonia Reeves   Joint pain    LBBB (left bundle branch block) 09/24/2012   Morbid obesity ([place]) 11/06/2012    Obesity    Oxygen dependent 2 L   Pain in left knee    Peripheral neuropathy    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   LIMA to D2, SVG to ramus intermediate, sequential SVG to OM1-OM2, SVG to RCA with EVH via both legs    Sleep apnea    uses O2 at night   Tinea    Type 2 diabetes mellitus with hyperglycemia, with long-term current use of insulin ([place]) 04/20/2019   Uncontrolled diabetes mellitus 02/2019   Ventricular tachycardia ([place]) 01/16/15   sustained VT terminated with ATP, CL 340 msec   Vitamin B 12 deficiency 11/05/2018   Vitamin D deficiency 11/05/2018    Family History:  Diabetes, hypertension   Social History:  Has aid for support at home (cooks for her), lives with son. Otherwise indepdent of ADLs and iADLS.  Uses walker to ambulate.  PCP: Everardo Beals, NP ([facility]) Substance use: no tobacco, no alcohol. No substance use.   Review of Systems: A complete ROS was negative except as per HPI.   Physical Exam: Blood pressure (!) 118/90, pulse 87, temperature 97.7 F (36.5 C), temperature source Oral, resp. rate 16, weight 108.9 kg, SpO2 (!) 89 %.  General: Pleasant, ill-appearing elderly female laying in bed. No acute distress. CV: RRR. No murmurs, rubs, or gallops. No LE edema Pulmonary: Lungs CTAB. Normal effort. No wheezing or rales. Abdominal: Soft, nondistended. Mild tenderness to palpation in lower abd quadrants. Normal bowel sounds. Extremities: Bilateral lower extremities are cool to touch, unable to palpate DP pulses.  Skin: Cool and dry. Unable to visualize bed sore as patient just had BM Neuro: A&Ox3. Moves all extremities. Normal sensation. No focal deficit. Psych: Normal mood and affect   EKG: personally reviewed my interpretation is normal sinus rhythm with new LBBB, prolonged Qtc to 547  CXR: personally reviewed my interpretation is some airspace disease in left base  Assessment & Plan by Problem: Principal Problem:   Acute kidney injury ([place])  AKI Baseline  Cr 0.8, now elevated to 2.83. AKI is likely prerenal secondary to ongoing GI losses. The patient received 2 L of IV fluids in the ED. - Daily BMP - Avoid nephrotoxins  - Encourage po intake   Enteritis Nausea and vomiting Patient presented with 3-4 episodes of non-bloody, non-bilious emesis. She has not had any further episodes of emesis since she has been in the hospital.  - Hold zofran given prolonged QT - One dose of ativan 0.5 mg for nausea - Clear liquid diet  Questionable new LBBB Prolonged QT EKG with evidence of a new LBBB (although may have been present before) and  prolonged QT interval. The patient is on medications that could prolong her QT interval (tramadol). She denies any chest pain at this time, but her first troponin level was elevated to 20 in the ED. - Delta trop pending - Avoid QT prolonging agents  Elevated lactic acid Initial lactic acid elevated to 3.7. Patient was initially hypotensive secondary to dehydration, thus she likely was not perfusing well at this time. - Repeat lactic pending - IV fluids as above  Possible pneumonia/COPD exacerbation The patient has a long history of COPD and has been oxygen dependent for many years, wearing 2 L [place]. She has had a new cough with sputum production for the last few days, although she denies any worsening shortness of breath. She remains afebrile and does not have an elevated WBC count. CXR revealed some airspace disease in the lung bases in L>R, could be secondary to atelectasis vs scarring vs infection. She received 1 dose of Cefepime in the ED. Sepsis is ruled out given that the patient does not meet any SIRS criteria. - Maintain SpO2 88-92% - Albuterol nebulizer q6h prn - Blood cultures pending - Procalcitonin - Daily CBC  Insulin dependent type 2 diabetes Patient states she is no longer on long-acting insulin and only uses Novolog for SSI. - A1c pending - SSI  Hypertension On lisinopril 2.5 mg, imdur 30 mg, and  coreg 6.25 mg bid, although patient's children believe some of these medications may have changed recently. - Holding antihypertensives given low-normal BP   Hx of DVTs Has had multiple blood clots in the past and is on lifelong Eliquis. - Continue eliquis 5 mg bid  CAD s/p 5x CABG HLD On rosuvastatin 20 mg daily at home and aspirin 81 mg daily. - Resume home meds  Dispo: Admit patient to Observation with expected length of stay less than 2 midnights.  SignedDorethea Clan, DO 10/29/2021, 6:25 PM  Pager: [phone] After 5pm on weekdays and 1pm on weekends: On Call pager: [phone]

## 2021-10-29 NOTE — ED Notes (Signed)
Jamie Rued, MD aware of Lactic 3.7.

## 2021-10-29 NOTE — ED Provider Notes (Signed)
Ultrasound ED Peripheral IV (Provider)  Date/Time: 10/29/2021 2:33 PM  Performed by: Mickie Hillier, PA-C Authorized by: Mickie Hillier, PA-C   Procedure details:    Indications: hydration, hypotension, multiple failed IV attempts and poor IV access     Skin Prep: chlorhexidine gluconate     Location:  Left AC   Angiocath:  20 G   Bedside Ultrasound Guided: Yes     Images: not archived     Patient tolerated procedure without complications: Yes     Dressing applied: Yes       Mickie Hillier, PA-C 10/29/21 1434    Lajean Saver, MD 10/29/21 1501

## 2021-10-29 NOTE — Progress Notes (Signed)
[facility] following for sepsis protocol. 

## 2021-10-29 NOTE — Progress Notes (Signed)
Pharmacy Antibiotic Note  [name] is a 71 y.o. female for which pharmacy has been consulted for cefepime and vancomyin dosing for sepsis.  Patient with a history of HTN, CAD HLD, IDDM, CKD. Patient presenting with N/V.  SCr 2.83 - 0.85 in August 2023 WBC 7; LA 3.7; T 97.7; HR 82; RR 24>16  Plan: Cefepime 2g q24hr Vancomycin 2000 mg once, subsequent dosing as indicated per random vancomycin level until renal function stable and/or improved, at which time scheduled dosing can be considered Trend WBC, Fever, Renal function F/u cultures, clinical course, WBC, fever De-escalate when able     No data recorded.  Recent Labs  Lab 10/29/21 1401  WBC 7.0    CrCl cannot be calculated (Patient's most recent lab result is older than the maximum 21 days allowed.).    Allergies  Allergen Reactions   Metformin And Related Itching, Swelling and Other (See Comments)    Leg pain & swelling in legs   Ozempic (0.25 Or 0.5 Mg-Dose) [Semaglutide(0.25 Or 0.'5mg'$ -Dos)] Other (See Comments)    Pt with hx of pancreatitis    Rosuvastatin    Atorvastatin Calcium Itching and Rash   Levofloxacin Itching    Antimicrobials this admission: cefepime 10/22 >>  vancomycin 10/22 >>   Microbiology results: Pending  Thank you for allowing pharmacy to be a part of this patient's care.  Lorelei Pont, PharmD, BCPS 10/29/2021 3:08 PM ED Clinical Pharmacist -  [phone]

## 2021-10-29 NOTE — ED Provider Notes (Signed)
[facility] EMERGENCY DEPARTMENT Provider Note   CSN: [id number] Arrival date & time: 10/29/21  1330     History  Chief Complaint  Patient presents with   Emesis   Near Syncope    [name] is a 71 y.o. female.  Pt c/o nausea and vomiting onset this AM. Symptoms acute onset, moderate, persistent. Emesis yellowish/clear, not bloody or bilious. No diarrhea. Had normal bm yesterday. No abd pain or distension. No known ill contacts or bad food ingestion. No change in meds. Felt faint/lightheaded earlier, no syncope. No headache. No neck pain or stiffness. No chest pain or discomfort. No sob. No dysuria or gu c/o. No melena, rectal bleeding or recent blood loss. No fever or chills.   The history is provided by the patient, a relative, medical records and the EMS personnel.       Home Medications Prior to Admission medications   Medication Sig Start Date End Date Taking? Authorizing Provider  albuterol (PROVENTIL) (2.5 MG/3ML) 0.083% nebulizer solution Take 2.5 mg by nebulization every 6 (six) hours as needed for wheezing or shortness of breath.  09/30/15   [provider]  Alcohol Swabs (ALCOHOL WIPES) 70 % PADS DropSafe Alcohol Prep Pads  USE WHEN CHECKING BLOOD SUGARS    [provider]  amitriptyline (ELAVIL) 10 MG tablet Take 10 mg by mouth at bedtime.    [provider]  apixaban (ELIQUIS) 5 MG TABS tablet Take 1 tablet (5 mg total) by mouth 2 (two) times daily. 04/21/21   Angelia Mould, MD  Baclofen 5 MG TABS Take 5 mg by mouth daily.    [provider]  carvedilol (COREG) 6.25 MG tablet Take 1 tablet (6.25 mg total) by mouth 2 (two) times daily with a meal. 02/22/19   Eulogio Bear U, DO  Continuous Blood Gluc Sensor (DEXCOM G6 SENSOR) MISC 1 Device by Does not apply route as directed. 01/25/21   Shamleffer, Melanie Crazier, MD  Continuous Blood Gluc Transmit (DEXCOM G6 TRANSMITTER) MISC 1 Device by Does not  apply route as directed. 02/25/20   Shamleffer, Melanie Crazier, MD  cyanocobalamin (,VITAMIN B-12,) 1000 MCG/ML injection 1,000 mcg every 30 (thirty) days.    [provider]  DROPLET PEN NEEDLES 32G X 4 MM MISC USE IN THE MORNING, AT NOON, IN THE EVENING, AND AT BEDTIME. 03/21/20   Shamleffer, Melanie Crazier, MD  FARXIGA 10 MG TABS tablet TAKE 1 TABLET EVERY DAY 09/28/21   Shamleffer, Melanie Crazier, MD  ferrous sulfate 325 (65 FE) MG EC tablet Take 325 mg by mouth every other day.    [provider]  furosemide (LASIX) 40 MG tablet TAKE 1 TABLET EVERY DAY 07/24/21   Jerline Pain, MD  gabapentin (NEURONTIN) 300 MG capsule Take 300 mg by mouth at bedtime. 12/23/20   [provider]  isosorbide mononitrate (IMDUR) 30 MG 24 hr tablet Take 30 mg by mouth daily. 02/14/21   [provider]  latanoprost (XALATAN) 0.005 % ophthalmic solution Administer 1 drop to both eyes nightly.    [provider]  lisinopril (ZESTRIL) 2.5 MG tablet Take 1 tablet by mouth daily. 02/22/21   [provider]  meloxicam (MOBIC) 15 MG tablet Take 1 tablet by mouth daily. 11/26/20   [provider]  methocarbamol (ROBAXIN) 500 MG tablet Take 1 tablet (500 mg total) by mouth 2 (two) times daily as needed for muscle spasms. 02/22/19   Geradine Girt, DO  nitroGLYCERIN (NITROSTAT)  0.4 MG SL tablet Place 0.4 mg under the tongue every 5 (five) minutes as needed for chest pain.    [provider]  NOVOLOG FLEXPEN 100 UNIT/ML FlexPen INJECT PER SLIDING SCALE AS DIRECTED , MAX 30 UNITS PER DAY. DISCARD PEN 28 DAYS AFTER OPENING 10/11/21   Shamleffer, Melanie Crazier, MD  potassium chloride SA (KLOR-CON) 20 MEQ tablet Take 20 mEq by mouth daily as needed (for low pottassium). 02/23/20   [provider]  rosuvastatin (CRESTOR) 20 MG tablet Take 1 tablet (20 mg total) by mouth daily at 6 PM. 10/07/15   Minus Liberty, MD  traMADol (ULTRAM) 50 MG tablet Take  50 mg by mouth daily. 11/28/15   [provider]  TRESIBA FLEXTOUCH 100 UNIT/ML FlexTouch Pen INJECT 24 UNITS INTO THE SKIN DAILY. Patient taking differently: Inject 26 Units into the skin daily. 07/10/21   Shamleffer, Melanie Crazier, MD  Vitamin D, Ergocalciferol, (DRISDOL) 1.25 MG (50000 UT) CAPS capsule Take 1 capsule (50,000 Units total) by mouth every 7 (seven) days. 12/30/18   Jearld Lesch A, DO      Allergies    Metformin and related, Ozempic (0.25 or 0.5 mg-dose) [semaglutide(0.25 or 0.'5mg'$ -dos)], Rosuvastatin, Atorvastatin calcium, and Levofloxacin    Review of Systems   Review of Systems  Constitutional:  Negative for fever.  HENT:  Negative for sore throat.   Eyes:  Negative for redness.  Respiratory:  Positive for cough. Negative for shortness of breath.   Cardiovascular:  Negative for chest pain, palpitations and leg swelling.  Gastrointestinal:  Positive for nausea and vomiting. Negative for abdominal pain and blood in stool.  Genitourinary:  Negative for dysuria and flank pain.  Musculoskeletal:  Negative for back pain, neck pain and neck stiffness.  Skin:  Negative for rash.  Neurological:  Negative for speech difficulty, weakness, numbness and headaches.  Hematological:  Does not bruise/bleed easily.  Psychiatric/Behavioral:  Negative for confusion.     Physical Exam Updated Vital Signs BP (!) 89/50 (BP Location: Right Arm)   Pulse 82   Resp 18   SpO2 (!) 82%  Physical Exam Vitals and nursing note reviewed.  Constitutional:      Appearance: Normal appearance. She is well-developed.  HENT:     Head: Atraumatic.     Nose: Nose normal.     Mouth/Throat:     Mouth: Mucous membranes are moist.  Eyes:     General: No scleral icterus.    Conjunctiva/sclera: Conjunctivae normal.     Pupils: Pupils are equal, round, and reactive to light.  Neck:     Vascular: No carotid bruit.     Trachea: No tracheal deviation.     Comments: No stiffness or rigidity.   Cardiovascular:     Rate and Rhythm: Normal rate and regular rhythm.     Pulses: Normal pulses.     Heart sounds: Normal heart sounds. No murmur heard.    No friction rub. No gallop.  Pulmonary:     Effort: Pulmonary effort is normal. No respiratory distress.     Breath sounds: Normal breath sounds.  Abdominal:     General: Bowel sounds are normal. There is no distension.     Palpations: Abdomen is soft. There is no mass.     Tenderness: There is no abdominal tenderness. There is no guarding.  Genitourinary:    Comments: No cva tenderness.  Musculoskeletal:        General: No swelling or tenderness.     Cervical  back: Normal range of motion and neck supple. No rigidity. No muscular tenderness.  Skin:    General: Skin is warm and dry.     Findings: No rash.  Neurological:     Mental Status: She is alert.     Comments: Alert, speech normal. Motor/sens grossly intact bil.   Psychiatric:        Mood and Affect: Mood normal.     ED Results / Procedures / Treatments   Labs (all labs ordered are listed, but only abnormal results are displayed) Results for orders placed or performed during the hospital encounter of 10/29/21  CBC  Result Value Ref Range   WBC 7.0 4.0 - 10.5 K/uL   RBC 5.20 (H) 3.87 - 5.11 MIL/uL   Hemoglobin 16.1 (H) 12.0 - 15.0 g/dL   HCT 49.6 (H) 36.0 - 46.0 %   MCV 95.4 80.0 - 100.0 fL   MCH 31.0 26.0 - 34.0 pg   MCHC 32.5 30.0 - 36.0 g/dL   RDW 14.8 11.5 - 15.5 %   Platelets 223 150 - 400 K/uL   nRBC 0.0 0.0 - 0.2 %  CBG monitoring, ED  Result Value Ref Range   Glucose-Capillary 211 (H) 70 - 99 mg/dL   DG Chest Port 1 View  Result Date: 10/29/2021 CLINICAL DATA:  Cough, near syncopal episode in a 71 year old female. EXAM: PORTABLE CHEST 1 VIEW COMPARISON:  August 16, 2021 FINDINGS: Imaging limited by portable technique and patient body habitus. Lung volumes remain low. 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  Cardiomediastinal contours and hilar structures are stable. Airspace disease at the lung bases slightly increased on the LEFT as compared to the RIGHT compared to imaging from August. No lobar level consolidative process. No sign of pneumothorax or gross evidence of pleural effusion or pulmonary edema. On limited assessment no acute skeletal findings. IMPRESSION: Airspace disease at the lung bases slightly increased on the LEFT as compared to the RIGHT. Findings more likely related to atelectasis and or scarring. Difficult to exclude developing infection. No signs of pulmonary edema. Electronically Signed   By: Zetta Bills M.D.   On: 10/29/2021 14:21   VAS US CAROTID  Result Date: 10/27/2021 Carotid Arterial Duplex Study Patient Name:  YENTL VERGE  Date of Exam:   10/24/2021 Medical Rec #: [id number]         Accession #:    [id number] Date of Birth: 03/25/1950         Patient Gender: F Patient Age:   11 years Exam Location:  [facility] Procedure:      VAS US CAROTID Referring Phys: Ermalinda Barrios --------------------------------------------------------------------------------  Indications:  Bilateral bruits. Patient denies any cerebrovascular symptoms. Risk Factors: Hypertension, hyperlipidemia, Diabetes, no history of smoking,               coronary artery disease. Performing Technologist: Mariane Masters RVT  Examination Guidelines: A complete evaluation includes B-mode imaging, spectral Doppler, color Doppler, and power Doppler as needed of all accessible portions of each vessel. Bilateral testing is considered an integral part of a complete examination. Limited examinations for reoccurring indications may be performed as noted.  Right Carotid Findings: +----------+--------+--------+--------+-------------------------+--------+           PSV cm/sEDV cm/sStenosisPlaque Description       Comments +----------+--------+--------+--------+-------------------------+--------+ CCA Prox  53      5                                                  +----------+--------+--------+--------+-------------------------+--------+  CCA Distal68      10                                                +----------+--------+--------+--------+-------------------------+--------+ ICA Prox  104     23      1-39%   calcific and heterogenous         +----------+--------+--------+--------+-------------------------+--------+ ICA Mid   82      19              calcific                          +----------+--------+--------+--------+-------------------------+--------+ ICA Distal65      17                                                +----------+--------+--------+--------+-------------------------+--------+ ECA       315     3       >50%                                      +----------+--------+--------+--------+-------------------------+--------+ +----------+--------+-------+----------------+-------------------+           PSV cm/sEDV cmsDescribe        Arm Pressure (mmHG) +----------+--------+-------+----------------+-------------------+ Subclavian206     1      Multiphasic, [id number]                 +----------+--------+-------+----------------+-------------------+ +---------+--------+--+--------+--+---------+ VertebralPSV cm/s39EDV cm/s13Antegrade +---------+--------+--+--------+--+---------+  Left Carotid Findings: +----------+--------+--------+--------+------------------+--------+           PSV cm/sEDV cm/sStenosisPlaque DescriptionComments +----------+--------+--------+--------+------------------+--------+ CCA Prox  93      13                                         +----------+--------+--------+--------+------------------+--------+ CCA Distal66      11                                         +----------+--------+--------+--------+------------------+--------+ ICA Prox  60      12      1-39%   heterogenous                +----------+--------+--------+--------+------------------+--------+ ICA Mid   53      15                                         +----------+--------+--------+--------+------------------+--------+ ICA Distal49      16                                         +----------+--------+--------+--------+------------------+--------+ ECA       176     6                                          +----------+--------+--------+--------+------------------+--------+ +----------+--------+--------+----------------+-------------------+  PSV cm/sEDV cm/sDescribe        Arm Pressure (mmHG) +----------+--------+--------+----------------+-------------------+ [id number]     1       Multiphasic, [id number]                 +----------+--------+--------+----------------+-------------------+ +---------+--------+--+--------+-+---------+ VertebralPSV cm/s27EDV cm/s6Antegrade +---------+--------+--+--------+-+---------+   Summary: Right Carotid: Velocities in the right ICA are consistent with a 1-39% stenosis.                The ECA appears >50% stenosed. Left Carotid: Velocities in the left ICA are consistent with a 1-39% stenosis. Vertebrals:  Bilateral vertebral arteries demonstrate antegrade flow. Subclavians: Normal flow hemodynamics were seen in bilateral subclavian              arteries. *See table(s) above for measurements and observations.  Electronically signed by Carlyle Dolly MD on 10/27/2021 at 11:07:49 AM.    Final    CUP PACEART REMOTE DEVICE CHECK  Result Date: 10/24/2021 Scheduled remote reviewed. Normal device function.  Next remote 91 days- JJB    EKG EKG Interpretation  Date/Time:  Sunday October 29 2021 14:46:39 EDT Ventricular Rate:  75 PR Interval:  188 QRS Duration: 158 QT Interval:  489 QTC Calculation: 547 R Axis:   -43 Text Interpretation: Sinus rhythm Left bundle branch block Non-specific ST-t changes Confirmed by Lajean Saver [phone]) on 10/29/2021  2:48:53 PM  Radiology DG Chest Port 1 View  Result Date: 10/29/2021 CLINICAL DATA:  Cough, near syncopal episode in a 71 year old female. EXAM: PORTABLE CHEST 1 VIEW COMPARISON:  August 16, 2021 FINDINGS: Imaging limited by portable technique and patient body habitus. Lung volumes remain low. 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 Cardiomediastinal contours and hilar structures are stable. Airspace disease at the lung bases slightly increased on the LEFT as compared to the RIGHT compared to imaging from August. No lobar level consolidative process. No sign of pneumothorax or gross evidence of pleural effusion or pulmonary edema. On limited assessment no acute skeletal findings. IMPRESSION: Airspace disease at the lung bases slightly increased on the LEFT as compared to the RIGHT. Findings more likely related to atelectasis and or scarring. Difficult to exclude developing infection. No signs of pulmonary edema. Electronically Signed   By: Zetta Bills M.D.   On: 10/29/2021 14:21    Procedures Procedures    Medications Ordered in ED Medications  sodium chloride 0.9 % bolus 1,000 mL (has no administration in time range)  lactated ringers bolus 1,000 mL (has no administration in time range)  lactated ringers infusion (has no administration in time range)  lactated ringers bolus 1,000 mL (has no administration in time range)  ceFEPIme (MAXIPIME) 2 g in sodium chloride 0.9 % 100 mL IVPB (has no administration in time range)  vancomycin (VANCOCIN) IVPB 1000 mg/200 mL premix (has no administration in time range)    ED Course/ Medical Decision Making/ A&P                           Medical Decision Making Problems Addressed: Dehydration: acute illness or injury with systemic symptoms that poses a threat to life or bodily functions Hypotension due to hypovolemia: acute illness or injury with systemic symptoms that poses a threat to life or bodily functions Nausea  and vomiting in adult: acute illness or injury with systemic symptoms that poses a threat to life or bodily functions Near syncope: acute illness or injury with systemic symptoms that poses  a threat to life or bodily functions  Amount and/or Complexity of Data Reviewed Independent Historian: EMS    Details: Fam/EMS, hx External Data Reviewed: notes. Labs: ordered. Decision-making details documented in ED Course. Radiology: ordered and independent interpretation performed. Decision-making details documented in ED Course. ECG/medicine tests: ordered and independent interpretation performed. Decision-making details documented in ED Course.  Risk Prescription drug management. Decision regarding hospitalization.   Iv ns. Continuous pulse ox and cardiac monitoring. Labs ordered/sent. Imaging ordered.   Reviewed nursing notes and prior charts for additional history. External reports reviewed. Additional history from: EMS/family.   Cardiac monitor: sinus rhythm, rate 84.  Labs reviewed/interpreted by me - wbc normal. Lactate pending. Hgb 16.  Xrays reviewed/interpreted by me - atelectasis.   Iv ns bolus. Zofran iv. Bp is low. Cultures sent. Empiric dose iv abx given.   2nd iv line. Additonal fluid bolus.   Recheck pt - pt continues to deny any pain. No chest pain or discomfort. No headache. No abd pain or tenderness.   After ~ 500 cc ns, bp mildly improved from prior, mid 80s. Nausea improved. Additional fluids running.   1503 labs pending, signed out to oncoming EDP, Dr Maryan Rued to check labs and facilitate admission.  If bp improved w ivf, likely unassigned medicine admission.   CRITICAL CARE  RE; hypotension, nausea/vomiting, dehydration, r/o sepsis Performed by: Mirna Mires Total critical care time: 45 minutes Critical care time was exclusive of separately billable procedures and treating other patients. Critical care was necessary to treat or prevent imminent or  life-threatening deterioration. Critical care was time spent personally by me on the following activities: development of treatment plan with patient and/or surrogate as well as nursing, discussions with consultants, evaluation of patient's response to treatment, examination of patient, obtaining history from patient or surrogate, ordering and performing treatments and interventions, ordering and review of laboratory studies, ordering and review of radiographic studies, pulse oximetry and re-evaluation of patient's condition.          Final Clinical Impression(s) / ED Diagnoses Final diagnoses:  None    Rx / DC Orders ED Discharge Orders     None         Lajean Saver, MD 10/29/21 1507

## 2021-10-29 NOTE — ED Provider Notes (Signed)
Patient is a 71 year old female presenting to today with persistent nausea and vomiting all day with generalized weakness and hypotension.  Patient was brought in by EMS with blood pressures in the 70s.  She was also found to be hypoxic.  Upon arrival here pressures in the 80s with O2 sat of 82% on room air.  Patient reports in the last few days she has been coughing with a mild productive cough but did not start vomiting until today.  She denies any abdominal pain and she has not had any diarrhea.  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  I independently interpreted patient's labs today and CBC with hemoconcentration with a hemoglobin of 16 but normal white count, beta hydroxybutyric acid was normal, COVID was negative.  Lactic acid today was elevated at 3.7 and a CMP showed new AKI with creatinine of 2.83 with a baseline of 0.8.  No anion gap today.  LFTs are within normal limits lipase was normal.  On patient's chest x-ray there was concern for airspace disease which could be atelectasis but possible pneumonia.  Patient was not found to be febrile here but was covered with antibiotics by Dr. Delice Lesch on prior to my arrival.  After IV fluids on repeat evaluation patient's blood pressure is 113/70.  Oxygen saturations have been 100% on nasal cannula.  Patient is awake and mentating.  Will admit for further fluids.  Low suspicion for kidney stone, bowel obstruction or perforation as patient has no abdominal pain on my exam.  UA is still pending.  Will admit for further care.   Blanchie Dessert, MD 10/29/21 [phone]

## 2021-10-29 NOTE — ED Triage Notes (Addendum)
Patient arrived by [facility] for near syncopal event. Patient with ongoing nausea since am with vomiting. Patient alert, denies abdominal pain Patient founbd actively vomiting and hypotensive in triage

## 2021-10-29 NOTE — ED Notes (Signed)
ED TO INPATIENT HANDOFF REPORT  ED Nurse Name and Phone #: Stanton Kidney, RN  S Name/Age/Gender [name] 71 y.o. female Room/Bed: 031C/031C  Code Status   Code Status: Full Code  Home/SNF/Other Home Patient oriented to: self, place, time, and situation Is this baseline? Yes   Triage Complete: Triage complete  Chief Complaint Acute kidney injury [facility]) [N17.9]  Triage Note Patient arrived by [facility] for near syncopal event. Patient with ongoing nausea since am with vomiting. Patient alert, denies abdominal pain Patient founbd actively vomiting and hypotensive in triage   Allergies Allergies  Allergen Reactions   Metformin And Related Itching, Swelling and Other (See Comments)    Leg pain & swelling in legs   Ozempic (0.25 Or 0.5 Mg-Dose) [Semaglutide(0.25 Or 0.'5mg'$ -Dos)] Other (See Comments)    Pt with hx of pancreatitis    Rosuvastatin    Atorvastatin Calcium Itching and Rash   Levofloxacin Itching    Level of Care/Admitting Diagnosis ED Disposition     ED Disposition  Admit   Condition  --   [place]: [place] [[id number]]  Level of Care: Med-Surg [16]  May place patient in observation at [facility] or [place] if equivalent level of care is available:: No  Covid Evaluation: Asymptomatic - no recent exposure (last 10 days) testing not required  Diagnosis: Acute kidney injury [facility]) [[id number]]  Admitting Physician: Lucious Groves [[id number]]  Attending Physician: Bosie Helper          B Medical/Surgery History Past Medical History:  Diagnosis Date   Abnormal LFTs    AICD (automatic cardioverter/defibrillator) present    Angina decubitus 11/06/2012   Angina pectoris ([place])    Anxiety    Arthritis    Back pain    Bundle branch block 05/2016   CAD (coronary artery disease) 06/18/2010   CHF (congestive heart failure) ([place])    Chronic combined systolic and diastolic CHF, NYHA class 3 ([place])    Chronic combined systolic  and diastolic heart failure ([place])    Chronic systolic dysfunction of left ventricle 09/24/2012   Coronary artery disease    s/p CABG 2007   Depression    Dermal mycosis    Diabetes mellitus    type II   DKA (diabetic ketoacidoses) 01/12/2019   DVT (deep venous thrombosis) ([place]) 09/2015   bilateral   Edema, lower extremity    Glaucoma    Hyperlipidemia    Hypertension    Hypoglycemia 09/09/2012   Insomnia    Insulin dependent diabetes mellitus ([place]) 04/23/2012   Ischemic cardiomyopathy    EF 30-35%, s/p ICD 4/08 by Dr Leonia Reeves   Joint pain    LBBB (left bundle branch block) 09/24/2012   Morbid obesity ([place]) 11/06/2012   Obesity    Oxygen dependent 2 L   Pain in left knee    Peripheral neuropathy    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   LIMA to D2, SVG to ramus intermediate, sequential SVG to OM1-OM2, SVG to RCA with EVH via both legs    Sleep apnea    uses O2 at night   Tinea    Type 2 diabetes mellitus with hyperglycemia, with long-term current use of insulin ([place]) 04/20/2019   Uncontrolled diabetes mellitus 02/2019   Ventricular tachycardia ([place]) 01/16/15   sustained VT terminated with ATP, CL 340 msec   Vitamin B 12 deficiency 11/05/2018   Vitamin D deficiency 11/05/2018   Past Surgical History:  Procedure  Laterality Date   BI-VENTRICULAR IMPLANTABLE CARDIOVERTER DEFIBRILLATOR UPGRADE N/A 09/25/2012   Procedure: BI-VENTRICULAR IMPLANTABLE CARDIOVERTER DEFIBRILLATOR UPGRADE;  Surgeon: Coralyn Mark, MD;  Location: [facility] CATH LAB;  Service: Cardiovascular;  Laterality: N/A;   BREAST BIOPSY Right 04/05/2014   CARDIAC CATHETERIZATION     CARDIAC DEFIBRILLATOR PLACEMENT  4/08   by Dr Leonia Reeves (MDT)   COLONOSCOPY WITH PROPOFOL N/A 10/12/2016   Procedure: COLONOSCOPY WITH PROPOFOL;  Surgeon: Doran Stabler, MD;  Location: [facility] ENDOSCOPY;  Service: Gastroenterology;  Laterality: N/A;   CORONARY ARTERY BYPASS GRAFT  10/05/2005   by Dr Cyndia Bent   IMPLANTABLE CARDIOVERTER DEFIBRILLATOR IMPLANT  09/25/12    attempt of upgrade to CRT-D unsuccessful due to CS anatomy, SJM Unify [place] device placed with LV port capped by Dr Rayann Heman   IR GENERIC HISTORICAL  10/03/2015   IR US GUIDE VASC ACCESS LEFT 10/03/2015 Jacqulynn Cadet, MD [facility]   IR GENERIC HISTORICAL  10/03/2015   IR VENO/EXT/UNI LEFT 10/03/2015 Jacqulynn Cadet, MD [facility]   IR GENERIC HISTORICAL  10/03/2015   IR VENOCAVAGRAM IVC 10/03/2015 Jacqulynn Cadet, MD [facility]   IR GENERIC HISTORICAL  10/03/2015   IR INFUSION THROMBOL VENOUS INITIAL (MS) 10/03/2015 Jacqulynn Cadet, MD [facility]   IR GENERIC HISTORICAL  10/03/2015   IR US GUIDE VASC ACCESS LEFT 10/03/2015 Jacqulynn Cadet, MD [facility]   IR GENERIC HISTORICAL  10/04/2015   IR THROMBECT VENO MECH MOD SED 10/04/2015 Sandi Mariscal, MD [facility]   IR GENERIC HISTORICAL  10/04/2015   IR TRANSCATH PLC STENT 1ST ART NOT LE CV CAR VERT CAR 10/04/2015 Sandi Mariscal, MD [facility]   IR GENERIC HISTORICAL  10/04/2015   IR THROMB F/U EVAL ART/VEN FINAL DAY (MS) 10/04/2015 Sandi Mariscal, MD [facility]   IR GENERIC HISTORICAL  11/02/2015   IR RADIOLOGIST EVAL & MGMT 11/02/2015 Sandi Mariscal, MD [facility] INTER[facility] RAD   IR RADIOLOGIST EVAL & MGMT  04/26/2016   LEFT HEART CATH AND CORS/GRAFTS ANGIOGRAPHY N/A 01/19/2019   Procedure: LEFT HEART CATH AND CORS/GRAFTS ANGIOGRAPHY;  Surgeon: Belva Crome, MD;  Location: [place] CV LAB;  Service: Cardiovascular;  Laterality: N/A;     A IV Location/Drains/Wounds Patient Lines/Drains/Airways Status     Active Line/Drains/Airways     Name Placement date Placement time Site Days   Peripheral IV 10/29/21 20 G 1.75" Left Antecubital 10/29/21  1435  Antecubital  less than 1   Pressure Injury 02/20/19 Sacrum Right Stage 2 -  Partial thickness loss of dermis presenting as a shallow open injury with a red, pink wound bed without slough. 02/20/19  1408  -- 982   Pressure Injury 02/20/19 Buttocks Right Stage 2 -  Partial thickness loss of  dermis presenting as a shallow open injury with a red, pink wound bed without slough. 02/20/19  1409  -- 982   Pressure Injury 08/16/21 Buttocks Right;Medial Stage 2 -  Partial thickness loss of dermis presenting as a shallow open injury with a red, pink wound bed without slough. partial tissue loss 2cm x 3 cm reddened area 08/16/21  1750  -- 74            Intake/Output Last 24 hours No intake or output data in the 24 hours ending 10/29/21 1915  Labs/Imaging Results for orders placed or performed during the hospital encounter of 10/29/21 (from the past 48 hour(s))  CBC     Status: Abnormal   Collection Time: 10/29/21  2:01 PM  Result Value Ref Range   WBC 7.0 4.0 - 10.5 K/uL   RBC 5.20 (H) 3.87 - 5.11 MIL/uL   Hemoglobin 16.1 (H) 12.0 - 15.0 g/dL   HCT 49.6 (H) 36.0 - 46.0 %   MCV 95.4 80.0 - 100.0 fL   MCH 31.0 26.0 - 34.0 pg   MCHC 32.5 30.0 - 36.0 g/dL   RDW 14.8 11.5 - 15.5 %   Platelets 223 150 - 400 K/uL   nRBC 0.0 0.0 - 0.2 %    Comment: Performed at [facility] [address], [place], [place] [address]  Comprehensive metabolic panel     Status: Abnormal   Collection Time: 10/29/21  2:01 PM  Result Value Ref Range   Sodium 135 135 - 145 mmol/L   Potassium 4.7 3.5 - 5.1 mmol/L   Chloride 102 98 - 111 mmol/L   CO2 19 (L) 22 - 32 mmol/L   Glucose, Bld 235 (H) 70 - 99 mg/dL    Comment: Glucose reference range applies only to samples taken after fasting for at least 8 hours.   BUN 29 (H) 8 - 23 mg/dL   Creatinine, Ser 2.83 (H) 0.44 - 1.00 mg/dL   Calcium 10.1 8.9 - 10.3 mg/dL   Total Protein 8.6 (H) 6.5 - 8.1 g/dL   Albumin 3.8 3.5 - 5.0 g/dL   AST 31 15 - 41 U/L   ALT 21 0 - 44 U/L   Alkaline Phosphatase 99 38 - 126 U/L   Total Bilirubin 1.3 (H) 0.3 - 1.2 mg/dL   GFR, Estimated 17 (L) >60 mL/min    Comment: (NOTE) Calculated using the CKD-EPI Creatinine Equation (2021)    Anion gap 14 5 - 15    Comment: Performed at [facility], [place] [address] [place], [place] [address]  Lactic acid, plasma     Status: Abnormal   Collection Time: 10/29/21  2:01 PM  Result Value Ref Range   Lactic Acid, Venous 3.7 (HH) 0.5 - 1.9 mmol/L    Comment: CRITICAL RESULT CALLED TO, READ BACK BY AND VERIFIED WITH M. GROSE RN 10/29/21 '@1529'$  BY J. WHITE Performed at [facility] [address] [place], [place] [address]   Troponin I (High Sensitivity)     Status: Abnormal   Collection Time: 10/29/21  2:01 PM  Result Value Ref Range   Troponin I (High Sensitivity) 20 (H) <18 ng/L    Comment: (NOTE) Elevated high sensitivity troponin I (hsTnI) values and significant  changes across serial measurements may suggest ACS but many other  chronic and acute conditions are known to elevate hsTnI results.  Refer to the "Links" section for chest pain algorithms and additional  guidance. Performed at [facility], [place] [address] [place], [place] [address]   Lipase, blood     Status: None   Collection Time: 10/29/21  2:01 PM  Result Value Ref Range   Lipase 42 11 - 51 U/L    Comment: Performed at [facility] [address] [place], [place] [address]  Beta-hydroxybutyric acid     Status: None   Collection Time: 10/29/21  2:02 PM  Result Value Ref Range   Beta-Hydroxybutyric Acid 0.14 0.05 - 0.27 mmol/L    Comment: Performed at [facility] [address], [place], [place] [address]  CBG monitoring, ED     Status: Abnormal   Collection Time: 10/29/21  2:42 PM  Result Value  Ref Range   Glucose-Capillary 211 (H) 70 - 99 mg/dL    Comment: Glucose reference range applies only to samples taken after fasting for at least 8 hours.  SARS Coronavirus 2 by RT PCR (hospital order, performed in [facility] hospital lab) *cepheid single result test* Anterior Nasal Swab     Status: None   Collection Time: 10/29/21  2:51 PM   Specimen: Anterior Nasal Swab  Result Value Ref Range   SARS Coronavirus 2 by RT PCR NEGATIVE NEGATIVE    Comment:  (NOTE) SARS-CoV-2 target nucleic acids are NOT DETECTED.  The SARS-CoV-2 RNA is generally detectable in upper and lower respiratory specimens during the acute phase of infection. The lowest concentration of SARS-CoV-2 viral copies this assay can detect is 250 copies / mL. A negative result does not preclude SARS-CoV-2 infection and should not be used as the sole basis for treatment or other patient management decisions.  A negative result may occur with improper specimen collection / handling, submission of specimen other than nasopharyngeal swab, presence of viral mutation(s) within the areas targeted by this assay, and inadequate number of viral copies (<250 copies / mL). A negative result must be combined with clinical observations, patient history, and epidemiological information.  Fact Sheet for Patients:   https://www.patel.info/  Fact Sheet for Healthcare Providers: https://hall.com/  This test is not yet approved or  cleared by the [place] FDA and has been authorized for detection and/or diagnosis of SARS-CoV-2 by FDA under an Emergency Use Authorization (EUA).  This EUA will remain in effect (meaning this test can be used) for the duration of the COVID-19 declaration under Section 564(b)(1) of the Act, 21 U.S.C. section 360bbb-3(b)(1), unless the authorization is terminated or revoked sooner.  Performed at [facility], [place] [address], [place], [place] [address]   Lactic acid, plasma     Status: Abnormal   Collection Time: 10/29/21  4:27 PM  Result Value Ref Range   Lactic Acid, Venous 2.8 (HH) 0.5 - 1.9 mmol/L    Comment: CRITICAL VALUE NOTED. VALUE IS CONSISTENT WITH PREVIOUSLY REPORTED/CALLED VALUE Performed at [facility], [place] [address] [place] [address] [place],  [address]   CBG monitoring, ED     Status: Abnormal   Collection Time: 10/29/21  6:49 PM  Result Value Ref Range   Glucose-Capillary 133 (H) 70 - 99  mg/dL    Comment: Glucose reference range applies only to samples taken after fasting for at least 8 hours.   Comment 1 Notify RN    Comment 2 Document in Chart    DG Chest Port 1 View  Result Date: 10/29/2021 CLINICAL DATA:  Cough, near syncopal episode in a 71 year old female. EXAM: PORTABLE CHEST 1 VIEW COMPARISON:  August 16, 2021 FINDINGS: Imaging limited by portable technique and patient body habitus. Lung volumes remain low. 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 Cardiomediastinal contours and hilar structures are stable. Airspace disease at the lung bases slightly increased on the LEFT as compared to the RIGHT compared to imaging from August. No lobar level consolidative process. No sign of pneumothorax or gross evidence of pleural effusion or pulmonary edema. On limited assessment no acute skeletal findings. IMPRESSION: Airspace disease at the lung bases slightly increased on the LEFT as compared to the RIGHT. Findings more likely related to atelectasis and or scarring. Difficult to exclude developing infection. No signs of pulmonary edema. Electronically Signed   By: Jewel Baize.D.  On: 10/29/2021 14:21    Pending Labs Unresulted Labs (From admission, onward)     Start     Ordered   10/30/21 9629  Basic metabolic panel  Tomorrow morning,   R        10/29/21 1756   10/30/21 0500  CBC  Tomorrow morning,   R        10/29/21 1756   10/30/21 0500  Hemoglobin A1c  Tomorrow morning,   R       Comments: To assess prior glycemic control    10/29/21 1808   10/29/21 1627  Lactic acid, plasma  Now then every 2 hours,   R      10/29/21 1626   10/29/21 1402  Blood culture (routine x 2)  BLOOD CULTURE X 2,   R      10/29/21 1402   10/29/21 1401  Urinalysis, Routine w reflex microscopic  ONCE - STAT,   URGENT        10/29/21 1402            Vitals/Pain Today's Vitals   10/29/21 1515 10/29/21 1536 10/29/21 1700 10/29/21 1717  BP: (!) 99/24  (!)  118/90   Pulse:   87   Resp: (!) 24  16   Temp:  97.7 F (36.5 C)    TempSrc:  Oral    SpO2:   (!) 89%   Weight:    108.9 kg  PainSc:        Isolation Precautions No active isolations  Medications Medications  lactated ringers infusion ( Intravenous New Bag/Given 10/29/21 1816)  vancomycin variable dose per unstable renal function (pharmacist dosing) (has no administration in time range)  ceFEPIme (MAXIPIME) 2 g in sodium chloride 0.9 % 100 mL IVPB (has no administration in time range)  acetaminophen (TYLENOL) tablet 650 mg (has no administration in time range)    Or  acetaminophen (TYLENOL) suppository 650 mg (has no administration in time range)  albuterol (PROVENTIL) (2.5 MG/3ML) 0.083% nebulizer solution 2.5 mg (has no administration in time range)  apixaban (ELIQUIS) tablet 5 mg (has no administration in time range)  insulin aspart (novoLOG) injection 0-15 Units ( Subcutaneous Not Given 10/29/21 1852)  LORazepam (ATIVAN) injection 0.5 mg (has no administration in time range)  sodium chloride 0.9 % bolus 1,000 mL (0 mLs Intravenous Stopped 10/29/21 1541)  lactated ringers bolus 1,000 mL (0 mLs Intravenous Stopped 10/29/21 1730)  lactated ringers bolus 1,000 mL (0 mLs Intravenous Stopped 10/29/21 1602)  ceFEPIme (MAXIPIME) 2 g in sodium chloride 0.9 % 100 mL IVPB (0 g Intravenous Stopped 10/29/21 1541)  vancomycin (VANCOREADY) IVPB 2000 mg/400 mL (0 mg Intravenous Stopped 10/29/21 1748)    Mobility non-ambulatory Low fall risk   Focused Assessments Neuro Assessment Handoff:  Swallow screen pass? Yes          Neuro Assessment:   Neuro Checks:      Last Documented NIHSS Modified Score:   Has TPA been given? No If patient is a Neuro Trauma and patient is going to OR before floor call report to [place] nurse: [phone] or [phone]   R Recommendations: See Admitting Provider Note  Report given to:   Additional Notes: pt is AAOx4. Pt has on brief and  purewick. Pt is on 4L [place].

## 2021-10-30 ENCOUNTER — Observation Stay (HOSPITAL_COMMUNITY): Payer: Medicare HMO

## 2021-10-30 DIAGNOSIS — Z86718 Personal history of other venous thrombosis and embolism: Secondary | ICD-10-CM | POA: Diagnosis not present

## 2021-10-30 DIAGNOSIS — I110 Hypertensive heart disease with heart failure: Secondary | ICD-10-CM | POA: Diagnosis present

## 2021-10-30 DIAGNOSIS — N939 Abnormal uterine and vaginal bleeding, unspecified: Secondary | ICD-10-CM | POA: Diagnosis present

## 2021-10-30 DIAGNOSIS — E1165 Type 2 diabetes mellitus with hyperglycemia: Secondary | ICD-10-CM

## 2021-10-30 DIAGNOSIS — E119 Type 2 diabetes mellitus without complications: Secondary | ICD-10-CM | POA: Diagnosis present

## 2021-10-30 DIAGNOSIS — D259 Leiomyoma of uterus, unspecified: Secondary | ICD-10-CM | POA: Diagnosis not present

## 2021-10-30 DIAGNOSIS — E860 Dehydration: Secondary | ICD-10-CM | POA: Diagnosis present

## 2021-10-30 DIAGNOSIS — Z95810 Presence of automatic (implantable) cardiac defibrillator: Secondary | ICD-10-CM | POA: Diagnosis not present

## 2021-10-30 DIAGNOSIS — R112 Nausea with vomiting, unspecified: Secondary | ICD-10-CM | POA: Diagnosis not present

## 2021-10-30 DIAGNOSIS — J9611 Chronic respiratory failure with hypoxia: Secondary | ICD-10-CM | POA: Diagnosis present

## 2021-10-30 DIAGNOSIS — E861 Hypovolemia: Secondary | ICD-10-CM | POA: Diagnosis present

## 2021-10-30 DIAGNOSIS — Z951 Presence of aortocoronary bypass graft: Secondary | ICD-10-CM | POA: Diagnosis not present

## 2021-10-30 DIAGNOSIS — Z9981 Dependence on supplemental oxygen: Secondary | ICD-10-CM | POA: Diagnosis not present

## 2021-10-30 DIAGNOSIS — J440 Chronic obstructive pulmonary disease with acute lower respiratory infection: Secondary | ICD-10-CM | POA: Diagnosis present

## 2021-10-30 DIAGNOSIS — Z1152 Encounter for screening for COVID-19: Secondary | ICD-10-CM | POA: Diagnosis not present

## 2021-10-30 DIAGNOSIS — I255 Ischemic cardiomyopathy: Secondary | ICD-10-CM | POA: Diagnosis present

## 2021-10-30 DIAGNOSIS — J441 Chronic obstructive pulmonary disease with (acute) exacerbation: Secondary | ICD-10-CM | POA: Diagnosis present

## 2021-10-30 DIAGNOSIS — Z794 Long term (current) use of insulin: Secondary | ICD-10-CM | POA: Diagnosis not present

## 2021-10-30 DIAGNOSIS — E785 Hyperlipidemia, unspecified: Secondary | ICD-10-CM | POA: Diagnosis present

## 2021-10-30 DIAGNOSIS — I447 Left bundle-branch block, unspecified: Secondary | ICD-10-CM | POA: Diagnosis present

## 2021-10-30 DIAGNOSIS — J189 Pneumonia, unspecified organism: Secondary | ICD-10-CM | POA: Diagnosis present

## 2021-10-30 DIAGNOSIS — E8721 Acute metabolic acidosis: Secondary | ICD-10-CM | POA: Diagnosis present

## 2021-10-30 DIAGNOSIS — I5042 Chronic combined systolic (congestive) and diastolic (congestive) heart failure: Secondary | ICD-10-CM | POA: Diagnosis present

## 2021-10-30 DIAGNOSIS — Z6841 Body Mass Index (BMI) 40.0 and over, adult: Secondary | ICD-10-CM | POA: Diagnosis not present

## 2021-10-30 DIAGNOSIS — N179 Acute kidney failure, unspecified: Secondary | ICD-10-CM

## 2021-10-30 DIAGNOSIS — F32A Depression, unspecified: Secondary | ICD-10-CM | POA: Diagnosis present

## 2021-10-30 DIAGNOSIS — I959 Hypotension, unspecified: Secondary | ICD-10-CM | POA: Diagnosis present

## 2021-10-30 LAB — BASIC METABOLIC PANEL
Anion gap: 13 (ref 5–15)
BUN: 28 mg/dL — ABNORMAL HIGH (ref 8–23)
CO2: 19 mmol/L — ABNORMAL LOW (ref 22–32)
Calcium: 9.5 mg/dL (ref 8.9–10.3)
Chloride: 106 mmol/L (ref 98–111)
Creatinine, Ser: 2.01 mg/dL — ABNORMAL HIGH (ref 0.44–1.00)
GFR, Estimated: 26 mL/min — ABNORMAL LOW (ref >60)
Glucose, Bld: 186 mg/dL — ABNORMAL HIGH (ref 70–99)
Potassium: 4.3 mmol/L (ref 3.5–5.1)
Sodium: 138 mmol/L (ref 135–145)

## 2021-10-30 LAB — GLUCOSE, CAPILLARY
Glucose-Capillary: 109 mg/dL — ABNORMAL HIGH (ref 70–99)
Glucose-Capillary: 116 mg/dL — ABNORMAL HIGH (ref 70–99)
Glucose-Capillary: 127 mg/dL — ABNORMAL HIGH (ref 70–99)
Glucose-Capillary: 197 mg/dL — ABNORMAL HIGH (ref 70–99)
Glucose-Capillary: 198 mg/dL — ABNORMAL HIGH (ref 70–99)
Glucose-Capillary: 212 mg/dL — ABNORMAL HIGH (ref 70–99)
Glucose-Capillary: 238 mg/dL — ABNORMAL HIGH (ref 70–99)

## 2021-10-30 LAB — LACTIC ACID, PLASMA
Lactic Acid, Venous: 2.7 mmol/L (ref 0.5–1.9)
Lactic Acid, Venous: 2.2 mmol/L (ref 0.5–1.9)

## 2021-10-30 LAB — TROPONIN I (HIGH SENSITIVITY): Troponin I (High Sensitivity): 19 ng/L — ABNORMAL HIGH (ref <18)

## 2021-10-30 LAB — PROCALCITONIN: Procalcitonin: 7.15 ng/mL

## 2021-10-30 MED ORDER — LIDOCAINE 5 % EX PTCH
1.0000 | MEDICATED_PATCH | CUTANEOUS | Status: DC
  Administered 2021-10-30 – 2021-10-31 (×2): 1 via TRANSDERMAL
  Filled 2021-10-30 (×2): qty 1

## 2021-10-30 MED ORDER — ACETAMINOPHEN 500 MG PO TABS
1000.0000 mg | ORAL_TABLET | Freq: Four times a day (QID) | ORAL | Status: DC | PRN
  Administered 2021-10-30: 1000 mg via ORAL
  Filled 2021-10-30: qty 2

## 2021-10-30 MED ORDER — AZITHROMYCIN 250 MG PO TABS
500.0000 mg | ORAL_TABLET | Freq: Every day | ORAL | Status: DC
  Administered 2021-10-30 – 2021-10-31 (×2): 500 mg via ORAL
  Filled 2021-10-30 (×2): qty 2

## 2021-10-30 MED ORDER — APIXABAN 5 MG PO TABS
5.0000 mg | ORAL_TABLET | Freq: Two times a day (BID) | ORAL | Status: DC
  Administered 2021-10-30 – 2021-10-31 (×2): 5 mg via ORAL
  Filled 2021-10-30 (×2): qty 1

## 2021-10-30 MED ORDER — SODIUM CHLORIDE 0.9 % IV SOLN
1.0000 g | INTRAVENOUS | Status: DC
  Administered 2021-10-30 – 2021-10-31 (×2): 1 g via INTRAVENOUS
  Filled 2021-10-30 (×2): qty 10

## 2021-10-30 MED ORDER — CARVEDILOL 6.25 MG PO TABS
6.2500 mg | ORAL_TABLET | Freq: Two times a day (BID) | ORAL | Status: DC
  Administered 2021-10-30 – 2021-10-31 (×3): 6.25 mg via ORAL
  Filled 2021-10-30 (×3): qty 1

## 2021-10-30 MED ORDER — IOHEXOL 9 MG/ML PO SOLN
500.0000 mL | ORAL | Status: AC
  Administered 2021-10-30 (×2): 500 mL via ORAL

## 2021-10-30 MED ORDER — ISOSORBIDE MONONITRATE ER 30 MG PO TB24
30.0000 mg | ORAL_TABLET | Freq: Every day | ORAL | Status: DC
  Administered 2021-10-30 – 2021-10-31 (×2): 30 mg via ORAL
  Filled 2021-10-30 (×2): qty 1

## 2021-10-30 MED ORDER — ACETAMINOPHEN 650 MG RE SUPP: 650.0000 mg | Freq: Four times a day (QID) | RECTAL | Status: DC | PRN

## 2021-10-30 NOTE — Plan of Care (Signed)
  Problem: Coping: Goal: Ability to adjust to condition or change in health will improve Outcome: Progressing   Problem: Fluid Volume: Goal: Ability to maintain a balanced intake and output will improve Outcome: Progressing   Problem: Skin Integrity: Goal: Risk for impaired skin integrity will decrease Outcome: Progressing   Problem: Education: Goal: Knowledge of General Education information will improve Description: Including pain rating scale, medication(s)/side effects and non-pharmacologic comfort measures Outcome: Progressing   Problem: Nutrition: Goal: Adequate nutrition will be maintained Outcome: Progressing   Problem: Coping: Goal: Level of anxiety will decrease Outcome: Progressing   Problem: Pain Managment: Goal: General experience of comfort will improve Outcome: Progressing   Problem: Safety: Goal: Ability to remain free from injury will improve Outcome: Progressing   Problem: Skin Integrity: Goal: Risk for impaired skin integrity will decrease Outcome: Progressing

## 2021-10-30 NOTE — Discharge Instructions (Addendum)
-   on discharge, please call [facility] Gynecologic Oncology at [phone], Dr. Carlis Abbott will fit you in for appointment in the next 6-8 weeks to follow up on vaginal bleeding.

## 2021-10-30 NOTE — Progress Notes (Signed)
Subjective: No nausea, vomiting overnight. Continues to have a cough, is bringing up yellow sputum with no blood.   Nurse reported continued blood in purewick of unclear source and small amount of blood in bedpan of unclear source.   Objective:  Vital signs in last 24 hours: Vitals:   10/29/21 2035 10/30/21 0021 10/30/21 0043 10/30/21 0546  BP: 110/75 (!) 150/62 (!) 127/48 (!) 167/60  Pulse: 88 86 84 71  Resp: 20 (!) '22 20 20  '$ Temp: 100 F (37.8 C) 99.2 F (37.3 C) 98.8 F (37.1 C) 98.7 F (37.1 C)  TempSrc: Oral Oral Oral Oral  SpO2: 99% 98% 100% 99%  Weight: 108.6 kg     Height: '5\' 2"'$  (1.575 m)      Physical Exam Constitutional:  Normal appearing, in no acute distress HENT: Normocephalic and atraumatic.  Cardiovascular: RRR no murmurs Pulmonary: Pulmonary effort is normal, crackles present in left lower lobe.  Abdominal: Soft, non-distended, non-tender, no masses.  Genitourinary: Purewick suction filled with red-tinged fluid. Musculoskeletal: No lower extremity edema  Skin: warm and dry.  Neurological: alert, no focal deficit  Urinalysis  10/29/21 20:02  Appearance CLEAR  Bilirubin Urine NEGATIVE  Color, Urine YELLOW  Glucose, UA >=500 !  Hgb urine dipstick LARGE !  Ketones, ur NEGATIVE  Leukocytes,Ua TRACE !  Nitrite NEGATIVE  pH 5.0  Protein 100 !  Specific Gravity, Urine 1.015  Amorphous Crystal PRESENT  Bacteria, UA MANY !  RBC / HPF 11-20  Squamous Epithelial / LPF 0-5  WBC, UA 6-10   Lactic Acid, Venous    Component Value Date/Time   LATICACIDVEN 2.2 (HH) 10/30/2021 0848   Lab Results  Component Value Date   HGBA1C 7.7 (H) 10/29/2021    Pelvic Ultrasound:  Limited image quality and detail due to habitus and lack of endovaginal scanning. Abnormal thickening of the endometrial complex for a patient of this age. Did not detect the nearly 7 cm cystic lesion of the right ovary noted on the prior studies to have enlarged between the 2 exams. Did  not detect the multiple fibroids noted on prior imaging as there was no transvaginal component to this ultrasound study.  CT Abdomen IMPRESSION: 1. Simple cyst in the right adnexal region measuring approximately 5.7 x 6.7 cm. 2. Cholelithiasis without evidence of acute cholecystitis. 3. Advanced aortic atherosclerosis. 4. No evidence of nephrolithiasis or hydronephrosis. 5. Compression deformities of L2-L4, unchanged. No acute osseous abnormality  Assessment/Plan:  Principal Problem:   Acute kidney injury ([place])  AKI Resolving based on Cr trend 2.83>2.01, baseline ~0.8. Given clinical improvement and chronic combined systolic and diastolic heart failure, will d/c mIVF and recheck BMP tomorrow. - continue to hold lisinopril - discontinued mIVF  - continue to encourage PO fluid - repeat BMP tomorrow  Vaginal Bleeding New onset post-menopausal bleeding in the setting of known fibroids, known prior enlarging right ovarian cyst, and known thickened endometrial stripe. Currently being followed by [facility] Gyn[facility]Onc, management has been conservative due to stable presentation and high risk surgical candidate, while proceeding with preop clearance in the event patient becomes unstable or symptomatic. Discussed with Dr. Carlis Abbott at [place], patient should call to make f/u appointment in next 6-8 weeks for [facility] given new onset vaginal bleeding, but this is not urgent given endometrial biopsy benign in March 2023. CT abdomen showed right adnexal cyst measuring 5.7 x 6.7 cm, with long axis not enlarged from CT on 08/16/21 (measured 6.9 cm), with no obvious cause  for vaginal bleeding. Given MRI from 09/21/21 showed fibroid uterus with 2.0 cm intracavitary fibroid and endometrium thickened to 14 mm, fibroids are most likely cause of vaginal bleeding.  - Continue to monitor vaginal bleeding throughout admission  - Advise patient to follow-up with Dr. Carlis Abbott, [facility] Gyn Onc in 6-8 weeks  Elevated Lactic Acid Elevated  to 3.7 on admission, 2.2 this morning, reassured is resolving. - repeat lactic acid tomorrow  Nausea/Vomiting Appears improved based on patient report. Continue to monitor symptoms.  Asymptomatic Bacteriuria No need for treatment as patient has remained symptomatic. Also reassuring that patient has remained afebrile. White count trended upward 7>12.1, but also in the setting of suspected pneumonia. Urine not sent for culture, but causative organism most likely covered by ceftriaxone, consider culture if symptoms develop  Questionable new LBBB Prolonged QT Prior EKG also showed QT prolongation, and signs of possible LBBB, so it is unclear if LBBB found on EKG during this admission is sign of new pathology - hold amitriptyline and tramadol - repeat EKG tomorrow  PNA v COPD exacerbation:  Given left lower lobe crackles, chest x-ray findings, and increased white count, picture appears most consistent with pneumonia. However, patient has remained afebrile and procalcitonin is WNL, it is still possible that this is a COPD exacerbation. Will switch antibiotics to ceftriaxone and azithromycin and continue to monitor for clinical improvement. - ceftriaxone 1g QHS for 4 days (will allow 5 total days of cephalosporin coverage given 1 dose of cefepime yesterday)  - Azithromycin '500mg'$  QHS for 3 days  Insulin dependent type 2 diabetes Fasting glucose elevated to 211 on admission and has trended down to 116, receiving 2-3 units of insulin aspart every ~4-6 hours. A1c 7.7 - Continue sliding scale insulin   HTN:  Blood pressures mildly elevated, likely were low/normal at admission given patient was vomiting and acutely ill. Will plan to restart home coreg and imdur but continue to hold lisinopril given AKI  - restart home carvedilol 6.'25mg'$  bid and imdur '30mg'$  - hold lisinopril  Hx of DVT Considered whether vaginal bleeding was exacerbated by supratherapeutic eliquis dose, but given vaginal bleeding is  most likely due to known fibroids and high clotting risk, decided to continue eliquis.  - continue eliquis '5mg'$  bid .  CAD s/p 5x CABG HLD On rosuvastatin 20 mg daily at home and aspirin 81 mg daily. - Continue home meds  DIET: Clear liquid IVF: none DVT PPX: eliquis  BOWEL: none CODE: full FAM COM: Patient's son at bedside during rounds   Dispo: Admit patient to Observation with expected length of stay less than 2 midnights. Barriers to discharge: clinical improvement    LOS: 0 days   Annia Belt, Medical Student 10/30/2021, 7:48 AM

## 2021-10-30 NOTE — Progress Notes (Signed)
Pt arrived to unit @ 2035. Pt had loose BM and blood on pad. Pt skin split and macerated in groin, under breasts. Pt has pressure wound in gluteal cleft closest to sacrum. Pt given bath and antifungal powder applied to breasts and groin. Moisture barrier applied to wound in cleft. Mepilex placed on sacrum.   0020: Pt has heavy bloody vaginal discharge, including small clots. MD notified, MD to floor to examine pt.   0210: MD to floor to perform vaginal exam with speculum with RN and 2 NT assistance.Swabs obtained. MD ordered transabdominal US.   Notified by microbiology that ancillary vaginal specimen will need to be obtained using specialty swab from cytology. MD notified.

## 2021-10-30 NOTE — Hospital Course (Addendum)
  AKI:  Creatinine elevated to 2.83 on admission, was given IVF, encouraged PO fluids, and resolved based on Cr back below baseline (0.71) on discharge  Nausea/vomiting Patient admitted with nausea and vomiting that resolved throughout hospitalization.   PNA: Patient presented with left lower lobe crackles, chest x-ray with airspace disease in left lung base, and leukocytosis to 12.1. Cough improved during admission with sputum clearing from yellow to white. Started on cefepime 2g and then switched to ceftriaxone 1g and azithromycin '500mg'$ . Switched to oral cefadroxil and azithromycin on discharge.  Vaginal Bleeding:  After admission, patient found to have vaginal bleeding. Received speculum exam, transabdominal ultrasound and CT abdomen and pelvis, which did not identify any source of concern for vaginal bleeding. Per record review patient has known fibroids, known prior enlarging right ovarian cyst, and known thickened endometrial stripe. Vaginal bleeding felt to likely be due to intracavitary fibroid noted on prior MRI. Discussed with Dr. Carlis Abbott at [facility] who follows patient, will follow up outpatient in 6-8 weeks. Minimal - moderate vaginal bleeding on purewick on day of discharge.  LBBB Prolonged QT EKG with LBBB on admission, stable on repeat EKG, appears consistent with EKG from prior hospitalization. QT consistently prolonged on EKG throughout hospitalization, patient has known history of prolonged QT. Will switch home amitriptyline to melatonin at discharge, hold home tramadol at discharge, advise to discuss with PCP at hospital follow-up   Elevated Lactic Acid:  Elevated to 3.7 on admission, continued to normalize throughout admission  Asymptomatic bacteriuria UA on admission found to have many bacteria and trace leukocytes, was not treated as patient remained asymptomatic during admission, though organism likely covered by PNA treatment  HTN Blood pressures low/normal on  admission likely due to hypovolemia from vomiting. Pressures mildly elevated during hospitalization, restarted home coreg and imdur during admission, restarted home lisinopril on discharge  Insulin dependent type 2 diabetes Fasting glucose elevated to 211 on admission and has trended down on SSI. A1c 7.7  Hx of DVT:  Eliquis during admission for DVT PPX, held briefly due to workup for new vaginal bleeding.   CAD s/p 5x CABG HLD On rosuvastatin 20 mg daily at home and aspirin 81 mg daily. Home meds continued during hospitalization

## 2021-10-30 NOTE — Progress Notes (Signed)
In setting of concern for vaginal bleeding, pelvic exam performed.   External genitalia with dried small blood clots Speculum exam with bloody, white vaginal discharge.  Vaginal wall mucosa is unremarkable. Patient unable to tolerate exam secondary to discomfort, cervix unable to be visualized.   Patient denies sexual activity RN Faurie present along with two nurse techs.

## 2021-10-30 NOTE — Progress Notes (Signed)
Paged by nurse to evaluate patient for vaginal bleeding. On the way to floor, patient had a bowel movement in bed that was mixed from blood. However, upon clean up, it was clear bleeding was coming from vagina. Of note, she was seen recently by OB/GYN due to a growing ovarian cyst. Ordered a transabdominal ultrasound to evaluate for hemorrhagic ovarian cyst, and will do a vaginal exam if pt can tolerate positioning.

## 2021-10-31 ENCOUNTER — Other Ambulatory Visit (HOSPITAL_COMMUNITY): Payer: Self-pay

## 2021-10-31 DIAGNOSIS — E8721 Acute metabolic acidosis: Secondary | ICD-10-CM | POA: Diagnosis present

## 2021-10-31 LAB — BASIC METABOLIC PANEL
Anion gap: 6 (ref 5–15)
BUN: 9 mg/dL (ref 8–23)
CO2: 24 mmol/L (ref 22–32)
Calcium: 9.2 mg/dL (ref 8.9–10.3)
Chloride: 109 mmol/L (ref 98–111)
Creatinine, Ser: 0.71 mg/dL (ref 0.44–1.00)
GFR, Estimated: >60 mL/min (ref >60)
Glucose, Bld: 86 mg/dL (ref 70–99)
Potassium: 3.8 mmol/L (ref 3.5–5.1)
Sodium: 139 mmol/L (ref 135–145)

## 2021-10-31 LAB — CBC
HCT: 38.1 % (ref 36.0–46.0)
Hemoglobin: 12.9 g/dL (ref 12.0–15.0)
MCH: 31.3 pg (ref 26.0–34.0)
MCHC: 33.9 g/dL (ref 30.0–36.0)
MCV: 92.5 fL (ref 80.0–100.0)
Platelets: 141 10*3/uL — ABNORMAL LOW (ref 150–400)
RBC: 4.12 MIL/uL (ref 3.87–5.11)
RDW: 14.6 % (ref 11.5–15.5)
WBC: 9 10*3/uL (ref 4.0–10.5)
nRBC: 0 % (ref 0.0–0.2)

## 2021-10-31 LAB — GLUCOSE, CAPILLARY
Glucose-Capillary: 98 mg/dL (ref 70–99)
Glucose-Capillary: 142 mg/dL — ABNORMAL HIGH (ref 70–99)
Glucose-Capillary: 208 mg/dL — ABNORMAL HIGH (ref 70–99)

## 2021-10-31 MED ORDER — POTASSIUM CHLORIDE CRYS ER 10 MEQ PO TBCR
40.0000 meq | EXTENDED_RELEASE_TABLET | Freq: Once | ORAL | Status: AC
  Administered 2021-10-31: 40 meq via ORAL
  Filled 2021-10-31: qty 4

## 2021-10-31 MED ORDER — CEFADROXIL 500 MG PO CAPS
500.0000 mg | ORAL_CAPSULE | Freq: Two times a day (BID) | ORAL | 0 refills | Status: AC
  Filled 2021-10-31: qty 4, 2d supply, fill #0

## 2021-10-31 MED ORDER — POTASSIUM CHLORIDE 20 MEQ PO PACK: 40.0000 meq | PACK | Freq: Once | ORAL | Status: AC

## 2021-10-31 MED ORDER — AZITHROMYCIN 250 MG PO TABS
ORAL_TABLET | ORAL | 0 refills | Status: DC
  Filled 2021-10-31: qty 1, 1d supply, fill #0

## 2021-10-31 NOTE — Progress Notes (Signed)
Discharge meds delivered to pt room and taken with the patient at discharge

## 2021-10-31 NOTE — Progress Notes (Signed)
Discharge instructions given to pt and pts son. Both verbalized understanding of all teaching and had no further questions. Patient discharged to home son via wheelchair with all belongings.

## 2021-10-31 NOTE — Discharge Summary (Addendum)
Physician Discharge Summary  Patient ID: [name] MRN: [id number] DOB/AGE: 07-06-1950 71 y.o.  Admit date: 10/29/2021 Discharge date: 10/31/2021  Admission Diagnoses: AKI, Community Acquired PNA  Discharge Diagnoses:  CAP AKI Post-menopausal vaginal bleeding Elevated lactic acid Asymptomatic bacteriuria Prolonged QT/LBBB Insulin dependent type 2 diabetes Hypertension Hx of DVT CAD s/p quintuple CABG Mixed Acute Anion-gap/non anion gap metabolic acidosis, POA Morbid obesity with BMI of 43 Chronic hypoxic respiratory failure, POA  Hospital Course:    AKI:  Mixed Acute metabolic acidosis Creatinine elevated to 2.83 on admission, was given IVF, encouraged PO fluids, and resolved based on Cr back below baseline (0.71) on discharge. Elevated creatinine was prerenal in nature secondary to GI losses. Recommend repeating BMP at hospital follow up.  Mildly elevated lactic acid and GI losses caused a mixed anion gap non anion gap metabolic acidosis which resolved with IVF.  Nausea/vomiting Patient admitted with nausea and vomiting that resolved throughout hospitalization.   PNA: Chronic hypoxic respiratory failure Patient presented with left lower lobe crackles, chest x-ray with airspace disease in left lung base, and leukocytosis to 12.1. Cough improved during admission with sputum clearing from yellow to white. Started on cefepime 2g and then switched to ceftriaxone 1g and azithromycin '500mg'$ . Switched to oral cefadroxil 500 mg bid (2 more days) and azithromycin 500 mg (1 more day) on discharge.  She was discharge on home O2 (2L)  Vaginal Bleeding:  After admission, patient found to have vaginal bleeding. Received speculum exam, transabdominal ultrasound and CT abdomen and pelvis, which did not identify any source of concern for vaginal bleeding. Per record review patient has known fibroids, known prior enlarging right ovarian cyst, and known thickened endometrial stripe.  Vaginal bleeding felt to likely be due to intracavitary fibroid noted on prior MRI. Discussed with Dr. Carlis Abbott at [facility] who follows patient, will follow up outpatient in 6-8 weeks. Minimal - moderate vaginal bleeding on purewick on day of discharge.   LBBB Prolonged QT EKG with LBBB on admission, stable on repeat EKG, appears consistent with EKG from prior hospitalization. QT consistently prolonged on EKG throughout hospitalization, patient has known history of prolonged QT. Will switch home amitriptyline to melatonin at discharge, hold home tramadol at discharge, advise to discuss with PCP at hospital follow-up regarding restarting these medications.   Elevated Lactic Acid:  Elevated to 3.7 on admission, continued to normalize throughout admission with IV fluids.   Asymptomatic bacteriuria UA on admission found to have many bacteria and trace leukocytes, was not treated as patient remained asymptomatic during admission, though organism likely covered by PNA treatment  HTN Blood pressures low/normal on admission likely due to hypovolemia from vomiting. Pressures mildly elevated during hospitalization, restarted home coreg and imdur during admission, restarted home lisinopril on discharge.  Insulin dependent type 2 diabetes Fasting glucose elevated to 211 on admission and has trended down on SSI. A1c 7.7  Hx of DVT:  Eliquis during admission for DVT PPX, held briefly due to workup for new vaginal bleeding, but was resumed.   CAD s/p 5x CABG HLD On rosuvastatin 20 mg daily at home and aspirin 81 mg daily. Home meds continued during hospitalization  Bed sore/pressure ulcer Known bed sore that is managed/treated by the patient's home health aid. No signs of infection.   Morbid obesity -BMI 43  Discharge Exam: Blood pressure (!) 154/76, pulse 64, temperature 98.9 F (37.2 C), temperature source Oral, resp. rate 16, height '5\' 2"'$  (1.575 m), weight 108.6 kg, SpO2 100 %.  General  appearance: alert and no distress Resp: clear to auscultation bilaterally Cardio: regular rate and rhythm, S1, S2 normal, no murmur, click, rub or gallop GI/GU: soft, non-tender, no suprapubic tenderness. Some blood noted in purewick Extremities: extremities normal, atraumatic, no cyanosis or edema Incision/Wound: Open ~6cm sore in upper gluteal cleft, no pus present  Disposition: home   Allergies as of 10/31/2021       Reactions   Metformin And Related Itching, Swelling, Other (See Comments)   Leg pain & swelling in legs   Ozempic (0.25 Or 0.5 Mg-dose) [semaglutide(0.25 Or 0.'5mg'$ -dos)] Other (See Comments)   Pt with hx of pancreatitis    Rosuvastatin    Atorvastatin Calcium Itching, Rash   Levofloxacin Itching        Medication List     STOP taking these medications    amitriptyline 10 MG tablet Commonly known as: ELAVIL   traMADol 50 MG tablet Commonly known as: ULTRAM       TAKE these medications    albuterol (2.5 MG/3ML) 0.083% nebulizer solution Commonly known as: PROVENTIL Take 2.5 mg by nebulization every 6 (six) hours as needed for wheezing or shortness of breath.   Alcohol Wipes 70 % Pads DropSafe Alcohol Prep Pads  USE WHEN CHECKING BLOOD SUGARS   apixaban 5 MG Tabs tablet Commonly known as: Eliquis Take 1 tablet (5 mg total) by mouth 2 (two) times daily.   azithromycin 250 MG tablet Commonly known as: ZITHROMAX Take one tablet on 10/25 to finish antibiotic course Start taking on: November 01, 2021   Baclofen 5 MG Tabs Take 5 mg by mouth daily.   carvedilol 6.25 MG tablet Commonly known as: COREG Take 1 tablet (6.25 mg total) by mouth 2 (two) times daily with a meal.   cefadroxil 500 MG capsule Commonly known as: DURICEF Take 1 capsule (500 mg total) by mouth 2 (two) times daily for 2 days. Start taking on: November 01, 2021   cyanocobalamin 1000 MCG/ML injection Commonly known as: VITAMIN B12 1,000 mcg every 30 (thirty) days.   Dexcom  G6 Sensor Misc 1 Device by Does not apply route as directed.   Dexcom G6 Transmitter Misc 1 Device by Does not apply route as directed.   Droplet Pen Needles 32G X 4 MM Misc Generic drug: Insulin Pen Needle USE IN THE MORNING, AT NOON, IN THE EVENING, AND AT BEDTIME.   Farxiga 10 MG Tabs tablet Generic drug: dapagliflozin propanediol TAKE 1 TABLET EVERY DAY   ferrous sulfate 325 (65 FE) MG EC tablet Take 325 mg by mouth every other day.   furosemide 40 MG tablet Commonly known as: LASIX TAKE 1 TABLET EVERY DAY   gabapentin 300 MG capsule Commonly known as: NEURONTIN Take 300 mg by mouth at bedtime.   isosorbide mononitrate 30 MG 24 hr tablet Commonly known as: IMDUR Take 30 mg by mouth daily.   latanoprost 0.005 % ophthalmic solution Commonly known as: XALATAN Administer 1 drop to both eyes nightly.   lisinopril 2.5 MG tablet Commonly known as: ZESTRIL Take 1 tablet by mouth daily.   nitroGLYCERIN 0.4 MG SL tablet Commonly known as: NITROSTAT Place 0.4 mg under the tongue every 5 (five) minutes as needed for chest pain.   NovoLOG FlexPen 100 UNIT/ML FlexPen Generic drug: insulin aspart INJECT PER SLIDING SCALE AS DIRECTED , MAX 30 UNITS PER DAY. DISCARD PEN 28 DAYS AFTER OPENING   potassium chloride SA 20 MEQ tablet Commonly known as: KLOR-CON M Take 20 mEq  by mouth daily as needed (for low pottassium).   rosuvastatin 20 MG tablet Commonly known as: CRESTOR Take 1 tablet (20 mg total) by mouth daily at 6 PM.   Tyler Aas FlexTouch 100 UNIT/ML FlexTouch Pen Generic drug: insulin degludec INJECT 24 UNITS INTO THE SKIN DAILY. What changed: how much to take   Vitamin D (Ergocalciferol) 1.25 MG (50000 UNIT) Caps capsule Commonly known as: DRISDOL Take 1 capsule (50,000 Units total) by mouth every 7 (seven) days.          Signed: Dorethea Clan 10/31/2021, 12:52 PM

## 2021-10-31 NOTE — Progress Notes (Signed)
Still having minimal vaginal bleeding.

## 2021-11-03 ENCOUNTER — Ambulatory Visit (INDEPENDENT_AMBULATORY_CARE_PROVIDER_SITE_OTHER): Payer: Medicare HMO | Admitting: Internal Medicine

## 2021-11-03 ENCOUNTER — Encounter: Payer: Self-pay | Admitting: Internal Medicine

## 2021-11-03 VITALS — BP 132/78 | HR 72 | Ht 62.0 in | Wt 232.0 lb

## 2021-11-03 DIAGNOSIS — Z794 Long term (current) use of insulin: Secondary | ICD-10-CM

## 2021-11-03 DIAGNOSIS — E1142 Type 2 diabetes mellitus with diabetic polyneuropathy: Secondary | ICD-10-CM | POA: Diagnosis not present

## 2021-11-03 DIAGNOSIS — E1165 Type 2 diabetes mellitus with hyperglycemia: Secondary | ICD-10-CM

## 2021-11-03 DIAGNOSIS — E1159 Type 2 diabetes mellitus with other circulatory complications: Secondary | ICD-10-CM | POA: Diagnosis not present

## 2021-11-03 LAB — CULTURE, BLOOD (ROUTINE X 2)
Culture: NO GROWTH
Culture: NO GROWTH
Special Requests: ADEQUATE

## 2021-11-03 LAB — POCT GLUCOSE (DEVICE FOR HOME USE): Glucose Fasting, POC: 220 mg/dL — AB (ref 70–99)

## 2021-11-03 MED ORDER — DEXCOM G7 SENSOR MISC: 1.0000 | 3 refills | Status: DC

## 2021-11-03 MED ORDER — DEXCOM G7 RECEIVER DEVI: 1.0000 | 0 refills | Status: DC

## 2021-11-03 MED ORDER — INSULIN PEN NEEDLE 31G X 5 MM MISC: 1.0000 | Freq: Four times a day (QID) | 3 refills | Status: AC

## 2021-11-03 MED ORDER — TRESIBA FLEXTOUCH 100 UNIT/ML ~~LOC~~ SOPN: 30.0000 [IU] | PEN_INJECTOR | Freq: Every day | SUBCUTANEOUS | 3 refills | Status: DC

## 2021-11-03 MED ORDER — DAPAGLIFLOZIN PROPANEDIOL 10 MG PO TABS: 10.0000 mg | ORAL_TABLET | Freq: Every day | ORAL | 3 refills | Status: DC

## 2021-11-03 NOTE — Progress Notes (Signed)
Name: [name]  Age/ Sex: 71 y.o., female   MRN/ DOB: [id number], 05-Jun-1950     PCP: Everardo Beals, NP   Reason for Endocrinology Evaluation: Type 2 Diabetes Mellitus  Initial Endocrine Consultative Visit: 04/20/2019    PATIENT IDENTIFIER: [name] is a 71 y.o. female with a past medical history of DM, HTN, Dyslipidemia , CAD and hx of pancreatitis.. The patient has followed with Endocrinology clinic since 04/20/2019 for consultative assistance with management of her diabetes.     DIABETIC HISTORY:  [name] was diagnosed with T2DM many years ago, she is intolerant to Metformin, has been on Glipizide without reported intolerance. Her hemoglobin A1c has ranged from 7.8% in 2017, peaking at 9.5% in 2014.  On her initial visit to our clinic she had an A1c of 7.7%  , she was on MDI regimen and Ozempic. Marland Kitchen We switched levemir to tresiba and continued Ozempic and Novolog.    farxiga started 11/2019 Ozempic stopped 12/2020 due to pancreatitis on CT imaging.     SUBJECTIVE:   During the last visit (04/28/2021): A1c 8.2 %  Today (11/03/2021): [name] is here for a follow up on diabetes.  She checks her blood sugar through the CGM The patient has had  hypoglycemic episodes since the last clinic visit, which typically occur 1 x /month  - most often occuring during the day. The patient is  symptomatic with these episodes.    Patient presented to the ED with nausea and vomiting, found to have AKI 10/2021 She continues to follow-up with cardiology for CHF and CAD 10/19/2021 She was seen by GYN for surveillance of ovarian mass 10/04/2021 She was evaluated by podiatry on 04/18/2021   Abdominal pain , nausea and  vomiting have resolved    HOME DIABETES REGIMEN:  Tresiba  26 units ONCE  Daily  Farxiga 10 mg daily  CF: Novolog (BG-130/40)     Statin: Yes ACE-I/ARB: No      CONTINUOUS GLUCOSE MONITORING RECORD INTERPRETATION: Not using      DIABETIC  COMPLICATIONS: Microvascular complications:  Neuropathy Denies: CKD, retinopathy  Last Eye Exam: Completed 2019  Macrovascular complications:  CAD, CHF Denies: CVA, PVD   HISTORY:  Past Medical History:  Past Medical History:  Diagnosis Date   Abnormal LFTs    AICD (automatic cardioverter/defibrillator) present    Angina decubitus 11/06/2012   Angina pectoris ([place])    Anxiety    Arthritis    Back pain    Bundle branch block 05/2016   CAD (coronary artery disease) 06/18/2010   CHF (congestive heart failure) ([place])    Chronic combined systolic and diastolic CHF, NYHA class 3 ([place])    Chronic combined systolic and diastolic heart failure ([place])    Chronic systolic dysfunction of left ventricle 09/24/2012   Coronary artery disease    s/p CABG 2007   Depression    Dermal mycosis    Diabetes mellitus    type II   DKA (diabetic ketoacidoses) 01/12/2019   DVT (deep venous thrombosis) ([place]) 09/2015   bilateral   Edema, lower extremity    Glaucoma    Hyperlipidemia    Hypertension    Hypoglycemia 09/09/2012   Insomnia    Insulin dependent diabetes mellitus ([place]) 04/23/2012   Ischemic cardiomyopathy    EF 30-35%, s/p ICD 4/08 by Dr Leonia Reeves   Joint pain    LBBB (left bundle branch block) 09/24/2012   Morbid obesity ([place]) 11/06/2012   Obesity  Oxygen dependent 2 L   Pain in left knee    Peripheral neuropathy    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   LIMA to D2, SVG to ramus intermediate, sequential SVG to OM1-OM2, SVG to RCA with EVH via both legs    Sleep apnea    uses O2 at night   Tinea    Type 2 diabetes mellitus with hyperglycemia, with long-term current use of insulin ([place]) 04/20/2019   Uncontrolled diabetes mellitus 02/2019   Ventricular tachycardia ([place]) 01/16/15   sustained VT terminated with ATP, CL 340 msec   Vitamin B 12 deficiency 11/05/2018   Vitamin D deficiency 11/05/2018   Past Surgical History:  Past Surgical History:  Procedure Laterality Date   BI-VENTRICULAR  IMPLANTABLE CARDIOVERTER DEFIBRILLATOR UPGRADE N/A 09/25/2012   Procedure: BI-VENTRICULAR IMPLANTABLE CARDIOVERTER DEFIBRILLATOR UPGRADE;  Surgeon: Coralyn Mark, MD;  Location: [facility] CATH LAB;  Service: Cardiovascular;  Laterality: N/A;   BREAST BIOPSY Right 04/05/2014   CARDIAC CATHETERIZATION     CARDIAC DEFIBRILLATOR PLACEMENT  4/08   by Dr Leonia Reeves (MDT)   COLONOSCOPY WITH PROPOFOL N/A 10/12/2016   Procedure: COLONOSCOPY WITH PROPOFOL;  Surgeon: Doran Stabler, MD;  Location: [facility] ENDOSCOPY;  Service: Gastroenterology;  Laterality: N/A;   CORONARY ARTERY BYPASS GRAFT  10/05/2005   by Dr Cyndia Bent   IMPLANTABLE CARDIOVERTER DEFIBRILLATOR IMPLANT  09/25/12   attempt of upgrade to CRT-D unsuccessful due to CS anatomy, SJM Unify Asaura device placed with LV port capped by Dr Rayann Heman   IR GENERIC HISTORICAL  10/03/2015   IR US GUIDE VASC ACCESS LEFT 10/03/2015 Jacqulynn Cadet, MD [facility]   IR GENERIC HISTORICAL  10/03/2015   IR VENO/EXT/UNI LEFT 10/03/2015 Jacqulynn Cadet, MD [facility]   IR GENERIC HISTORICAL  10/03/2015   IR VENOCAVAGRAM IVC 10/03/2015 Jacqulynn Cadet, MD [facility]   IR GENERIC HISTORICAL  10/03/2015   IR INFUSION THROMBOL VENOUS INITIAL (MS) 10/03/2015 Jacqulynn Cadet, MD [facility]   IR GENERIC HISTORICAL  10/03/2015   IR US GUIDE VASC ACCESS LEFT 10/03/2015 Jacqulynn Cadet, MD [facility]   IR GENERIC HISTORICAL  10/04/2015   IR THROMBECT VENO MECH MOD SED 10/04/2015 Sandi Mariscal, MD [facility]   IR GENERIC HISTORICAL  10/04/2015   IR TRANSCATH PLC STENT 1ST ART NOT LE CV CAR VERT CAR 10/04/2015 Sandi Mariscal, MD [facility]   IR GENERIC HISTORICAL  10/04/2015   IR THROMB F/U EVAL ART/VEN FINAL DAY (MS) 10/04/2015 Sandi Mariscal, MD [facility]   IR GENERIC HISTORICAL  11/02/2015   IR RADIOLOGIST EVAL & MGMT 11/02/2015 Sandi Mariscal, MD [facility]   IR RADIOLOGIST EVAL & MGMT  04/26/2016   LEFT HEART CATH AND CORS/GRAFTS ANGIOGRAPHY N/A 01/19/2019   Procedure:  LEFT HEART CATH AND CORS/GRAFTS ANGIOGRAPHY;  Surgeon: Belva Crome, MD;  Location: [facility];  Service: Cardiovascular;  Laterality: N/A;   Social History:  reports that she has never smoked. She has never used smokeless tobacco. She reports that she does not drink alcohol and does not use drugs. Family History:  Family History  Problem Relation Age of Onset   Diabetes Mother 86       died - HTN   Stroke Mother    High blood pressure Mother    Sudden death Mother    Obesity Mother    Other Other        No early family hx of CAD     HOME MEDICATIONS: Allergies as  of 11/03/2021       Reactions   Metformin And Related Itching, Swelling, Other (See Comments)   Leg pain & swelling in legs   Ozempic (0.25 Or 0.5 Mg-dose) [semaglutide(0.25 Or 0.'5mg'$ -dos)] Other (See Comments)   Pt with hx of pancreatitis    Rosuvastatin    Atorvastatin Calcium Itching, Rash   Levofloxacin Itching        Medication List        Accurate as of November 03, 2021  9:57 AM. If you have any questions, ask your nurse or doctor.          STOP taking these medications    azithromycin 250 MG tablet Commonly known as: ZITHROMAX Stopped by: Dorita Sciara, MD   Dexcom G6 Transmitter Misc Stopped by: Dorita Sciara, MD       TAKE these medications    albuterol (2.5 MG/3ML) 0.083% nebulizer solution Commonly known as: PROVENTIL Take 2.5 mg by nebulization every 6 (six) hours as needed for wheezing or shortness of breath.   Alcohol Wipes 70 % Pads DropSafe Alcohol Prep Pads  USE WHEN CHECKING BLOOD SUGARS   apixaban 5 MG Tabs tablet Commonly known as: Eliquis Take 1 tablet (5 mg total) by mouth 2 (two) times daily.   Baclofen 5 MG Tabs Take 5 mg by mouth daily.   carvedilol 6.25 MG tablet Commonly known as: COREG Take 1 tablet (6.25 mg total) by mouth 2 (two) times daily with a meal.   cefadroxil 500 MG capsule Commonly known as: DURICEF Take 1 capsule (500  mg total) by mouth 2 (two) times daily for 2 days.   cyanocobalamin 1000 MCG/ML injection Commonly known as: VITAMIN B12 1,000 mcg every 30 (thirty) days.   dapagliflozin propanediol 10 MG Tabs tablet Commonly known as: Farxiga Take 1 tablet (10 mg total) by mouth daily.   Dexcom G7 Receiver Devi 1 Device by Does not apply route as directed. Started by: Dorita Sciara, MD   Dexcom G7 Sensor Misc 1 Device by Does not apply route as directed.   ferrous sulfate 325 (65 FE) MG EC tablet Take 325 mg by mouth every other day.   furosemide 40 MG tablet Commonly known as: LASIX TAKE 1 TABLET EVERY DAY   gabapentin 300 MG capsule Commonly known as: NEURONTIN Take 300 mg by mouth at bedtime.   Insulin Pen Needle 31G X 5 MM Misc 1 Device by Does not apply route in the morning, at noon, in the evening, and at bedtime. What changed:  medication strength See the new instructions. Changed by: Dorita Sciara, MD   isosorbide mononitrate 30 MG 24 hr tablet Commonly known as: IMDUR Take 30 mg by mouth daily.   latanoprost 0.005 % ophthalmic solution Commonly known as: XALATAN Administer 1 drop to both eyes nightly.   lisinopril 2.5 MG tablet Commonly known as: ZESTRIL Take 1 tablet by mouth daily.   nitroGLYCERIN 0.4 MG SL tablet Commonly known as: NITROSTAT Place 0.4 mg under the tongue every 5 (five) minutes as needed for chest pain.   NovoLOG FlexPen 100 UNIT/ML FlexPen Generic drug: insulin aspart INJECT PER SLIDING SCALE AS DIRECTED , MAX 30 UNITS PER DAY. DISCARD PEN 28 DAYS AFTER OPENING   potassium chloride SA 20 MEQ tablet Commonly known as: KLOR-CON M Take 20 mEq by mouth daily as needed (for low pottassium).   rosuvastatin 20 MG tablet Commonly known as: CRESTOR Take 1 tablet (20 mg total) by mouth daily at  6 PM.   Tyler Aas FlexTouch 100 UNIT/ML FlexTouch Pen Generic drug: insulin degludec Inject 30 Units into the skin daily. What changed: how  much to take Changed by: Dorita Sciara, MD   Vitamin D (Ergocalciferol) 1.25 MG (50000 UNIT) Caps capsule Commonly known as: DRISDOL Take 1 capsule (50,000 Units total) by mouth every 7 (seven) days.         OBJECTIVE:   Vital Signs: BP 132/78 (BP Location: Left Arm, Patient Position: Sitting, Cuff Size: Large)   Pulse 72   Ht '5\' 2"'$  (1.575 m)   Wt 232 lb (105.2 kg)   SpO2 98%   BMI 42.43 kg/m    Wt Readings from Last 3 Encounters:  11/03/21 232 lb (105.2 kg)  10/29/21 239 lb 6.7 oz (108.6 kg)  09/27/21 241 lb (109.3 kg)     Exam: General: Pt appears well and is in NAD  Lungs: Clear with good BS bilat  Heart: RRR  Extremities:  Trace pretibial edema.  Neuro: MS is good with appropriate affect, pt is alert and Ox3     DM Foot Exam per podiatry 04/2021        DATA REVIEWED:  Lab Results  Component Value Date   HGBA1C 7.7 (H) 10/29/2021   HGBA1C 8.2 (A) 04/28/2021   HGBA1C 6.3 (A) 12/23/2020     Latest Reference Range & Units 10/31/21 03:11  Sodium 135 - 145 mmol/L 139  Potassium 3.5 - 5.1 mmol/L 3.8  Chloride 98 - 111 mmol/L 109  CO2 22 - 32 mmol/L 24  Glucose 70 - 99 mg/dL 86  BUN 8 - 23 mg/dL 9  Creatinine 0.44 - 1.00 mg/dL 0.71  Calcium 8.9 - 10.3 mg/dL 9.2  Anion gap 5 - 15  6  GFR, Estimated >60 mL/min >60     In office BG 220 mg/dL     ASSESSMENT / PLAN / RECOMMENDATIONS:   1) Type 2 Diabetes Mellitus, sub-optimally controlled, With Neuropathic and  macrovascular complications - Most recent A1c of 7.7  %. Goal A1c < 7.0 %.    - She has lost her Dexcom G6  and need a G7 sensors and receiver, this will be faxed to [facility] - Not a candidate for GLP1 agonist, DPP-4 inhibitors and mounjaro due to pancreatitis.  - She has not increased tresiba as previously advised, pt advised to increase tresiba as her fasting BG today was 220 mg/dL  - I have encouraged her to check glucose with meal and use novolog per scale as below    MEDICATIONS:  -Increase Tresiba 30 units ONCE  Daily  -Continue Farxiga 10 mg, 1 tablet with Breakfast  -Correction factor: Novolog ( BG-130/40)  EDUCATION / INSTRUCTIONS: BG monitoring instructions: Patient is instructed to check her blood sugars 3 times a day, before meals Call [facility] if: BG persistently < 70 I reviewed the Rule of 15 for the treatment of hypoglycemia in detail with the patient. Literature supplied.   2) Diabetic complications:  Eye: Does not have known diabetic retinopathy.  Neuro/ Feet: Does  have known diabetic peripheral neuropathy based on foot exam from 07/16/2019 Renal: Patient does not have known baseline CKD. She is not on an ACEI/ARB at present.      F/U in 6 months   Signed electronically by: Mack Guise, MD  [facility] End[facility]  [address] [place], [place] [address] Phone: [phone] FAX: [phone]   CC: Everardo Beals, [address]  [place] [address] Phone: [phone]  Fax: [phone]  Return to Endocrinology clinic as below: Future Appointments  Date Time Provider [place]  11/06/2021  7:15 AM [facility] DEVICE REMOTES [facility]  11/15/2021 11:00 AM Marzetta Board, DPM [facility]  01/22/2022  9:40 AM [facility] DEVICE REMOTES [facility]  01/31/2022  2:30 PM Vickie Epley, MD [facility]  03/06/2022  3:40 PM Jerline Pain, MD [facility]  04/23/2022  9:40 AM [facility] DEVICE REMOTES [facility]  07/23/2022  9:40 AM [facility] DEVICE REMOTES [facility]  10/22/2022  9:40 AM [facility] DEVICE REMOTES [facility]  01/21/2023  9:40 AM [facility] DEVICE REMOTES [facility]

## 2021-11-03 NOTE — Patient Instructions (Addendum)
-   Increase Tresiba 30 units ONCE  Daily -Continue Farxiga 10 mg, 1 tablet with Breakfast  -Novolog correctional insulin: Use the scale below to help guide you BEFORE you eat   Blood sugar before meal Number of units to inject  Less than 170 0 unit  171 -  210 2 units  211-  250 3 units  251 -  290 4 units  291 -  330 5 units  331 -  370 6 units     HOW TO TREAT LOW BLOOD SUGARS (Blood sugar LESS THAN 70 MG/DL) Please follow the RULE OF 15 for the treatment of hypoglycemia treatment (when your (blood sugars are less than 70 mg/dL)   STEP 1: Take 15 grams of carbohydrates when your blood sugar is low, which includes:  3-4 GLUCOSE TABS  OR 3-4 OZ OF JUICE OR REGULAR SODA OR ONE TUBE OF GLUCOSE GEL    STEP 2: RECHECK blood sugar in 15 MINUTES STEP 3: If your blood sugar is still low at the 15 minute recheck --> then, go back to STEP 1 and treat AGAIN with another 15 grams of carbohydrates.

## 2021-11-06 ENCOUNTER — Ambulatory Visit (INDEPENDENT_AMBULATORY_CARE_PROVIDER_SITE_OTHER): Payer: Medicare HMO

## 2021-11-06 DIAGNOSIS — Z95810 Presence of automatic (implantable) cardiac defibrillator: Secondary | ICD-10-CM

## 2021-11-06 DIAGNOSIS — I5042 Chronic combined systolic (congestive) and diastolic (congestive) heart failure: Secondary | ICD-10-CM | POA: Diagnosis not present

## 2021-11-06 NOTE — Progress Notes (Signed)
EPIC Encounter for ICM Monitoring  Patient Name: [name] is a 71 y.o. female Date: 11/06/2021 Primary Care Physican: Everardo Beals, NP Primary Cardiologist: Skains/Weaver PA Electrophysiologist: Quentin Ore 04/18/2021 Weight: 226 lbs 05/22/2021 Office Weight: 245 lbs 07/26/2021 Weight: 240 lbs 10/04/2021 Weight: 237 lbs 11/06/2021 Weight: 232 lbs   AT/AF Burden: <1% (taking Eliquis)                                                          Spoke with patient and heart failure questions reviewed.  Pt asymptomatic for fluid accumulation.  She reports she is feeling fine.    CorVue thoracic impedance suggesting normal fluid levels.   Prescribed: Furosemide 40 mg take 1 tablet daily Potassium 20 mEq take 1 tablet daily   Labs: 04/28/2021 10.5, BUN 27, Potassium 4.0, Sodium 140, GFR 53.49 A complete set of results can be found in Results Review.   Recommendations:  No changes and encouraged to call if experiencing any fluid symptoms.   Next ICM clinic phone appointment due: 12/11/2021.     Next 91 day remote transmission due: 01/22/2022.     EP/Cardiology Office Visits:  01/31/2022 with Dr Quentin Ore.  03/06/2022 with Dr Marlou Porch.   Copy of ICM check sent to Dr. Quentin Ore.   3 month ICM trend: 11/06/2021.    12-14 Month ICM trend:     [name], RN 11/06/2021 4:58 PM

## 2021-11-15 ENCOUNTER — Ambulatory Visit (INDEPENDENT_AMBULATORY_CARE_PROVIDER_SITE_OTHER): Payer: Medicare HMO | Admitting: Podiatry

## 2021-11-15 ENCOUNTER — Encounter: Payer: Self-pay | Admitting: Podiatry

## 2021-11-15 DIAGNOSIS — M79672 Pain in left foot: Secondary | ICD-10-CM | POA: Insufficient documentation

## 2021-11-15 DIAGNOSIS — B351 Tinea unguium: Secondary | ICD-10-CM

## 2021-11-15 DIAGNOSIS — E1151 Type 2 diabetes mellitus with diabetic peripheral angiopathy without gangrene: Secondary | ICD-10-CM | POA: Diagnosis not present

## 2021-11-15 DIAGNOSIS — M79674 Pain in right toe(s): Secondary | ICD-10-CM

## 2021-11-15 DIAGNOSIS — M79675 Pain in left toe(s): Secondary | ICD-10-CM | POA: Diagnosis not present

## 2021-11-17 NOTE — Progress Notes (Signed)
Remote ICD transmission.   

## 2021-11-20 NOTE — Progress Notes (Signed)
  Subjective:  Patient ID: [name], female    DOB: 12/02/1950,  MRN: [id number]  [name] presents to clinic today for at risk foot care. Pt has h/o NIDDM with PAD and painful thick toenails that are difficult to trim. Pain interferes with ambulation. Aggravating factors include wearing enclosed shoe gear. Pain is relieved with periodic professional debridement.  Chief Complaint  Patient presents with   Nail Problem    DFC  BG 113 A1C - 7.7 PCP - Everardo Beals    New problem(s): None.   PCP is Everardo Beals, NP , and last visit was November 10, 2021.  Allergies  Allergen Reactions   Metformin And Related Itching, Swelling and Other (See Comments)    Leg pain & swelling in legs   Ozempic (0.25 Or 0.5 Mg-Dose) [Semaglutide(0.25 Or 0.'5mg'$ -Dos)] Other (See Comments)    Pt with hx of pancreatitis    Rosuvastatin    Atorvastatin Calcium Itching and Rash   Levofloxacin Itching    Review of Systems: Negative except as noted in the HPI.  Objective: No changes noted in today's physical examination.  [name] is a pleasant 71 y.o. female morbidly obese in NAD. AAO x 3.  Vascular Examination: CFT <4 seconds b/l. DP pulses diminished b/l. PT pulses diminished b/l. Digital hair absent. Skin temperature gradient warm to cool b/l. No ischemia or gangrene. No cyanosis or clubbing noted b/l. Trace edema noted BLE.   Neurological Examination: Pt has subjective symptoms of neuropathy. Protective sensation decreased with 10 gram monofilament b/l. Vibratory sensation intact b/l.  Dermatological Examination: Pedal skin thin, shiny and atrophic b/l. Toenails 1-5 b/l thick, discolored, elongated with subungual debris and pain on dorsal palpation.  Healing abrasion(s) with intact scab noted L 3rd toe. No erythema, no edema, no drainage, no fluctuance.  Musculoskeletal Examination: Muscle strength 5/5 to b/l LE. Pes planus deformity noted bilateral LE.  Radiographs:  None Assessment/Plan: 1. Pain due to onychomycosis of toenails of both feet   2. Type II diabetes mellitus with peripheral circulatory disorder (HCC)     No orders of the defined types were placed in this encounter.   -Consent given for treatment as described below: -Examined patient. -Continue diabetic foot care principles: inspect feet daily, monitor glucose as recommended by PCP and/or Endocrinologist, and follow prescribed diet per PCP, Endocrinologist and/or dietician. -Toenails 1-5 b/l were debrided in length and girth with sterile nail nippers and dremel without iatrogenic bleeding.  -Patient/POA to call should there be question/concern in the interim.   Return in about 3 months (around 02/15/2022).  Marzetta Board, DPM

## 2021-12-11 ENCOUNTER — Other Ambulatory Visit: Payer: Self-pay | Admitting: Internal Medicine

## 2021-12-13 NOTE — Progress Notes (Addendum)
No ICM remote transmission received for 12/11/2021 and next ICM transmission scheduled for 12/18/2021.

## 2021-12-18 ENCOUNTER — Ambulatory Visit (INDEPENDENT_AMBULATORY_CARE_PROVIDER_SITE_OTHER): Payer: Medicare HMO

## 2021-12-18 DIAGNOSIS — Z95810 Presence of automatic (implantable) cardiac defibrillator: Secondary | ICD-10-CM | POA: Diagnosis not present

## 2021-12-18 DIAGNOSIS — I5042 Chronic combined systolic (congestive) and diastolic (congestive) heart failure: Secondary | ICD-10-CM

## 2021-12-20 ENCOUNTER — Telehealth: Payer: Self-pay

## 2021-12-20 NOTE — Progress Notes (Signed)
EPIC Encounter for ICM Monitoring  Patient Name: [name] is a 71 y.o. female Date: 12/20/2021 Primary Care Physican: Everardo Beals, NP Primary Cardiologist: Skains/Weaver PA Electrophysiologist: Quentin Ore 04/18/2021 Weight: 226 lbs 05/22/2021 Office Weight: 245 lbs 07/26/2021 Weight: 240 lbs 10/04/2021 Weight: 237 lbs 11/06/2021 Weight: 232 lbs   AT/AF Burden: 0% (taking Eliquis)                                                          Spoke with patient and heart failure questions reviewed.  Pt asymptomatic for fluid accumulation.  She reports she is feeling fine.    CorVue thoracic impedance suggesting normal fluid levels.   Prescribed: Furosemide 40 mg take 1 tablet daily Potassium 20 mEq take 1 tablet daily   Labs: 04/28/2021 10.5, BUN 27, Potassium 4.0, Sodium 140, GFR 53.49 A complete set of results can be found in Results Review.   Recommendations:  No changes and encouraged to call if experiencing any fluid symptoms.   Next ICM clinic phone appointment due: 01/29/2022.     Next 91 day remote transmission due: 01/22/2022.     EP/Cardiology Office Visits:  01/31/2022 with Dr Quentin Ore.  03/06/2022 with Dr Marlou Porch.   Copy of ICM check sent to Dr. Quentin Ore.    3 month ICM trend: 12/20/2021.    12-14 Month ICM trend:     [name], RN 12/20/2021 9:58 AM

## 2021-12-20 NOTE — Telephone Encounter (Signed)
Spoke with patient and requested remote transmission and she will send it now.

## 2022-01-10 ENCOUNTER — Other Ambulatory Visit: Payer: Self-pay | Admitting: Vascular Surgery

## 2022-01-12 ENCOUNTER — Other Ambulatory Visit: Payer: Self-pay

## 2022-01-12 ENCOUNTER — Telehealth: Payer: Self-pay

## 2022-01-12 ENCOUNTER — Emergency Department (HOSPITAL_COMMUNITY): Payer: Medicare HMO

## 2022-01-12 ENCOUNTER — Encounter (HOSPITAL_COMMUNITY): Payer: Self-pay | Admitting: Cardiology

## 2022-01-12 ENCOUNTER — Inpatient Hospital Stay (HOSPITAL_COMMUNITY)
Admission: EM | Admit: 2022-01-12 | Discharge: 2022-01-17 | DRG: 287 | Disposition: A | Discharge status: Home / Self Care | Payer: Medicare HMO | Attending: Cardiology | Admitting: Cardiology

## 2022-01-12 DIAGNOSIS — Z1152 Encounter for screening for COVID-19: Secondary | ICD-10-CM

## 2022-01-12 DIAGNOSIS — Z6841 Body Mass Index (BMI) 40.0 and over, adult: Secondary | ICD-10-CM

## 2022-01-12 DIAGNOSIS — I25110 Atherosclerotic heart disease of native coronary artery with unstable angina pectoris: Secondary | ICD-10-CM | POA: Diagnosis present

## 2022-01-12 DIAGNOSIS — I472 Ventricular tachycardia, unspecified: Secondary | ICD-10-CM | POA: Diagnosis not present

## 2022-01-12 DIAGNOSIS — I3481 Nonrheumatic mitral (valve) annulus calcification: Secondary | ICD-10-CM | POA: Diagnosis present

## 2022-01-12 DIAGNOSIS — Z86718 Personal history of other venous thrombosis and embolism: Secondary | ICD-10-CM

## 2022-01-12 DIAGNOSIS — Z7901 Long term (current) use of anticoagulants: Secondary | ICD-10-CM

## 2022-01-12 DIAGNOSIS — G4733 Obstructive sleep apnea (adult) (pediatric): Secondary | ICD-10-CM | POA: Diagnosis present

## 2022-01-12 DIAGNOSIS — I4901 Ventricular fibrillation: Secondary | ICD-10-CM | POA: Diagnosis not present

## 2022-01-12 DIAGNOSIS — Z823 Family history of stroke: Secondary | ICD-10-CM

## 2022-01-12 DIAGNOSIS — Z951 Presence of aortocoronary bypass graft: Secondary | ICD-10-CM

## 2022-01-12 DIAGNOSIS — E785 Hyperlipidemia, unspecified: Secondary | ICD-10-CM | POA: Diagnosis present

## 2022-01-12 DIAGNOSIS — I509 Heart failure, unspecified: Secondary | ICD-10-CM

## 2022-01-12 DIAGNOSIS — I255 Ischemic cardiomyopathy: Secondary | ICD-10-CM | POA: Diagnosis present

## 2022-01-12 DIAGNOSIS — E119 Type 2 diabetes mellitus without complications: Secondary | ICD-10-CM | POA: Diagnosis present

## 2022-01-12 DIAGNOSIS — Z95810 Presence of automatic (implantable) cardiac defibrillator: Secondary | ICD-10-CM

## 2022-01-12 DIAGNOSIS — E875 Hyperkalemia: Secondary | ICD-10-CM | POA: Diagnosis present

## 2022-01-12 DIAGNOSIS — Z8249 Family history of ischemic heart disease and other diseases of the circulatory system: Secondary | ICD-10-CM

## 2022-01-12 DIAGNOSIS — Z888 Allergy status to other drugs, medicaments and biological substances status: Secondary | ICD-10-CM

## 2022-01-12 DIAGNOSIS — I5042 Chronic combined systolic (congestive) and diastolic (congestive) heart failure: Secondary | ICD-10-CM | POA: Diagnosis present

## 2022-01-12 DIAGNOSIS — Z833 Family history of diabetes mellitus: Secondary | ICD-10-CM

## 2022-01-12 DIAGNOSIS — I110 Hypertensive heart disease with heart failure: Secondary | ICD-10-CM | POA: Diagnosis present

## 2022-01-12 DIAGNOSIS — I447 Left bundle-branch block, unspecified: Secondary | ICD-10-CM | POA: Diagnosis present

## 2022-01-12 DIAGNOSIS — Z79899 Other long term (current) drug therapy: Secondary | ICD-10-CM

## 2022-01-12 DIAGNOSIS — Z881 Allergy status to other antibiotic agents status: Secondary | ICD-10-CM

## 2022-01-12 DIAGNOSIS — Z794 Long term (current) use of insulin: Secondary | ICD-10-CM

## 2022-01-12 DIAGNOSIS — Z9981 Dependence on supplemental oxygen: Secondary | ICD-10-CM

## 2022-01-12 DIAGNOSIS — G629 Polyneuropathy, unspecified: Secondary | ICD-10-CM | POA: Diagnosis present

## 2022-01-12 LAB — URINALYSIS, ROUTINE W REFLEX MICROSCOPIC
Bilirubin Urine: NEGATIVE
Glucose, UA: >=500 mg/dL — AB
Hgb urine dipstick: NEGATIVE
Ketones, ur: NEGATIVE mg/dL
Leukocytes,Ua: NEGATIVE
Nitrite: NEGATIVE
Protein, ur: NEGATIVE mg/dL
Specific Gravity, Urine: 1.006 (ref 1.005–1.030)
pH: 5 (ref 5.0–8.0)

## 2022-01-12 LAB — BASIC METABOLIC PANEL
Anion gap: 12 (ref 5–15)
Anion gap: 7 (ref 5–15)
BUN: 15 mg/dL (ref 8–23)
BUN: 18 mg/dL (ref 8–23)
CO2: 21 mmol/L — ABNORMAL LOW (ref 22–32)
CO2: 27 mmol/L (ref 22–32)
Calcium: 9.8 mg/dL (ref 8.9–10.3)
Calcium: 9.4 mg/dL (ref 8.9–10.3)
Chloride: 107 mmol/L (ref 98–111)
Chloride: 103 mmol/L (ref 98–111)
Creatinine, Ser: 0.9 mg/dL (ref 0.44–1.00)
Creatinine, Ser: 1.13 mg/dL — ABNORMAL HIGH (ref 0.44–1.00)
GFR, Estimated: >60 mL/min (ref >60)
GFR, Estimated: 52 mL/min — ABNORMAL LOW (ref >60)
Glucose, Bld: 169 mg/dL — ABNORMAL HIGH (ref 70–99)
Glucose, Bld: 255 mg/dL — ABNORMAL HIGH (ref 70–99)
Potassium: 4.1 mmol/L (ref 3.5–5.1)
Potassium: 3.7 mmol/L (ref 3.5–5.1)
Sodium: 140 mmol/L (ref 135–145)
Sodium: 137 mmol/L (ref 135–145)

## 2022-01-12 LAB — CBC WITH DIFFERENTIAL/PLATELET
Abs Immature Granulocytes: 0.02 10*3/uL (ref 0.00–0.07)
Basophils Absolute: 0 10*3/uL (ref 0.0–0.1)
Basophils Relative: 0 %
Eosinophils Absolute: 0 10*3/uL (ref 0.0–0.5)
Eosinophils Relative: 0 %
HCT: 44.6 % (ref 36.0–46.0)
Hemoglobin: 14 g/dL (ref 12.0–15.0)
Immature Granulocytes: 0 %
Lymphocytes Relative: 12 %
Lymphs Abs: 0.8 10*3/uL (ref 0.7–4.0)
MCH: 29.9 pg (ref 26.0–34.0)
MCHC: 31.4 g/dL (ref 30.0–36.0)
MCV: 95.3 fL (ref 80.0–100.0)
Monocytes Absolute: 0.4 10*3/uL (ref 0.1–1.0)
Monocytes Relative: 6 %
Neutro Abs: 5.3 10*3/uL (ref 1.7–7.7)
Neutrophils Relative %: 82 %
Platelets: 137 10*3/uL — ABNORMAL LOW (ref 150–400)
RBC: 4.68 MIL/uL (ref 3.87–5.11)
RDW: 16 % — ABNORMAL HIGH (ref 11.5–15.5)
WBC: 6.5 10*3/uL (ref 4.0–10.5)
nRBC: 0 % (ref 0.0–0.2)

## 2022-01-12 LAB — I-STAT CHEM 8, ED
BUN: 22 mg/dL (ref 8–23)
Calcium, Ion: 1.13 mmol/L — ABNORMAL LOW (ref 1.15–1.40)
Chloride: 106 mmol/L (ref 98–111)
Creatinine, Ser: 0.8 mg/dL (ref 0.44–1.00)
Glucose, Bld: 159 mg/dL — ABNORMAL HIGH (ref 70–99)
HCT: 43 % (ref 36.0–46.0)
Hemoglobin: 14.6 g/dL (ref 12.0–15.0)
Potassium: 6.1 mmol/L — ABNORMAL HIGH (ref 3.5–5.1)
Sodium: 142 mmol/L (ref 135–145)
TCO2: 28 mmol/L (ref 22–32)

## 2022-01-12 LAB — RESP PANEL BY RT-PCR (RSV, FLU A&B, COVID)  RVPGX2
Influenza A by PCR: NEGATIVE
Influenza B by PCR: NEGATIVE
Resp Syncytial Virus by PCR: NEGATIVE
SARS Coronavirus 2 by RT PCR: NEGATIVE

## 2022-01-12 LAB — MAGNESIUM: Magnesium: 2 mg/dL (ref 1.7–2.4)

## 2022-01-12 LAB — TROPONIN I (HIGH SENSITIVITY)
Troponin I (High Sensitivity): 30 ng/L — ABNORMAL HIGH (ref <18)
Troponin I (High Sensitivity): 31 ng/L — ABNORMAL HIGH (ref <18)

## 2022-01-12 MED ORDER — SODIUM CHLORIDE 0.9 % IV SOLN: 250.0000 mL | INTRAVENOUS | Status: DC | PRN

## 2022-01-12 MED ORDER — INSULIN ASPART 100 UNIT/ML IJ SOLN
0.0000 [IU] | Freq: Three times a day (TID) | INTRAMUSCULAR | Status: DC
  Administered 2022-01-12: 3 [IU] via SUBCUTANEOUS
  Administered 2022-01-13: 8 [IU] via SUBCUTANEOUS
  Administered 2022-01-13: 5 [IU] via SUBCUTANEOUS
  Administered 2022-01-13: 3 [IU] via SUBCUTANEOUS
  Administered 2022-01-14: 15 [IU] via SUBCUTANEOUS
  Administered 2022-01-14: 3 [IU] via SUBCUTANEOUS
  Administered 2022-01-14: 2 [IU] via SUBCUTANEOUS
  Administered 2022-01-15: 5 [IU] via SUBCUTANEOUS
  Administered 2022-01-15: 3 [IU] via SUBCUTANEOUS
  Administered 2022-01-16: 5 [IU] via SUBCUTANEOUS
  Administered 2022-01-16: 2 [IU] via SUBCUTANEOUS
  Administered 2022-01-16 – 2022-01-17 (×2): 3 [IU] via SUBCUTANEOUS

## 2022-01-12 MED ORDER — ACETAMINOPHEN 325 MG PO TABS
650.0000 mg | ORAL_TABLET | ORAL | Status: DC | PRN
  Administered 2022-01-15: 650 mg via ORAL
  Filled 2022-01-12: qty 2

## 2022-01-12 MED ORDER — LATANOPROST 0.005 % OP SOLN: 1.0000 [drp] | Freq: Every evening | OPHTHALMIC | Status: DC | PRN

## 2022-01-12 MED ORDER — DAPAGLIFLOZIN PROPANEDIOL 10 MG PO TABS
10.0000 mg | ORAL_TABLET | Freq: Every day | ORAL | Status: DC
  Administered 2022-01-13 – 2022-01-17 (×5): 10 mg via ORAL
  Filled 2022-01-12 (×5): qty 1

## 2022-01-12 MED ORDER — ISOSORBIDE MONONITRATE ER 30 MG PO TB24
30.0000 mg | ORAL_TABLET | Freq: Every day | ORAL | Status: DC
  Administered 2022-01-12 – 2022-01-17 (×6): 30 mg via ORAL
  Filled 2022-01-12 (×6): qty 1

## 2022-01-12 MED ORDER — CARVEDILOL 12.5 MG PO TABS
12.5000 mg | ORAL_TABLET | Freq: Two times a day (BID) | ORAL | Status: DC
  Administered 2022-01-12 – 2022-01-17 (×10): 12.5 mg via ORAL
  Filled 2022-01-12 (×10): qty 1

## 2022-01-12 MED ORDER — NITROGLYCERIN 0.4 MG SL SUBL: 0.4000 mg | SUBLINGUAL_TABLET | SUBLINGUAL | Status: DC | PRN

## 2022-01-12 MED ORDER — AMIODARONE LOAD VIA INFUSION
150.0000 mg | Freq: Once | INTRAVENOUS | Status: AC
  Administered 2022-01-12: 150 mg via INTRAVENOUS
  Filled 2022-01-12: qty 83.34

## 2022-01-12 MED ORDER — ROSUVASTATIN CALCIUM 20 MG PO TABS
20.0000 mg | ORAL_TABLET | Freq: Every day | ORAL | Status: DC
  Administered 2022-01-12 – 2022-01-16 (×5): 20 mg via ORAL
  Filled 2022-01-12 (×5): qty 1

## 2022-01-12 MED ORDER — INSULIN ASPART 100 UNIT/ML IJ SOLN
0.0000 [IU] | Freq: Every day | INTRAMUSCULAR | Status: DC
  Administered 2022-01-12 – 2022-01-15 (×3): 2 [IU] via SUBCUTANEOUS
  Administered 2022-01-16: 3 [IU] via SUBCUTANEOUS

## 2022-01-12 MED ORDER — APIXABAN 5 MG PO TABS
5.0000 mg | ORAL_TABLET | Freq: Two times a day (BID) | ORAL | Status: DC
  Administered 2022-01-12 – 2022-01-14 (×4): 5 mg via ORAL
  Filled 2022-01-12 (×4): qty 1

## 2022-01-12 MED ORDER — AMIODARONE HCL IN DEXTROSE 360-4.14 MG/200ML-% IV SOLN
60.0000 mg/h | INTRAVENOUS | Status: DC
  Administered 2022-01-12 (×2): 60 mg/h via INTRAVENOUS
  Filled 2022-01-12: qty 200

## 2022-01-12 MED ORDER — SODIUM CHLORIDE 0.9% FLUSH
3.0000 mL | Freq: Two times a day (BID) | INTRAVENOUS | Status: DC
  Administered 2022-01-12 – 2022-01-14 (×5): 3 mL via INTRAVENOUS

## 2022-01-12 MED ORDER — AMIODARONE HCL IN DEXTROSE 360-4.14 MG/200ML-% IV SOLN
30.0000 mg/h | INTRAVENOUS | Status: DC
  Administered 2022-01-13 – 2022-01-14 (×2): 30 mg/h via INTRAVENOUS
  Filled 2022-01-12 (×3): qty 200

## 2022-01-12 MED ORDER — FUROSEMIDE 10 MG/ML IJ SOLN
80.0000 mg | Freq: Once | INTRAMUSCULAR | Status: AC
  Administered 2022-01-12: 80 mg via INTRAVENOUS
  Filled 2022-01-12: qty 8

## 2022-01-12 MED ORDER — SODIUM CHLORIDE 0.9% FLUSH
3.0000 mL | INTRAVENOUS | Status: DC | PRN
  Administered 2022-01-15: 3 mL via INTRAVENOUS

## 2022-01-12 NOTE — ED Triage Notes (Signed)
EMS stated, pt received a call from her Dr cause her defibrillator went off during the night. Pt has no symptoms except a stomach pain from a cyst

## 2022-01-12 NOTE — H&P (Addendum)
ELECTROPHYSIOLOGY H&P NOTE    Patient ID: [name] MRN: [id number], DOB/AGE: [birth year] 72 y.o.  Admit date: 01/12/2022 Date of Consult: 01/12/2022  Primary Physician: Everardo Beals, NP Primary Cardiologist: Candee Furbish, MD  Electrophysiologist: Dr. Rayann Heman -> Dr. Quentin Ore   Referring Provider: Dr. Gilford Raid  Patient Profile: [name] is a 72 y.o. female with a history of CAD s/p CABG 2007, ICM, chronic systolic CHF, DM2, h/o DVT, HTN, HLD, LBBB, obesity, OSA, and VT who is being seen today for the evaluation of ICD shock at the request of Dr. Gilford Raid.  HPI:  [name] is a 72 y.o. female with medical history as above.   Previously followed by Dr. Rayann Heman. Set up to follow now with Dr. Quentin Ore.   Today, device clinic received alert that pt received shock 1/3 at 0215. Pt stated she was asleep and doesn't remember anything. Event was not witnessed.   Pt complained of abdominal pain with 1-2 days of N/V/D. In this setting, she was recommended to be evaluated in the ED due to possibility of electrolyte imbalance.   Currently, pt is ok, though a little fatigued. Did have continued vomiting overnight. Last episode ~0200-0300. Had loose BM but no diarrhea.   Had some chest discomfort with her episode of vomiting overnight, but otherwise has not had CP.   States she was her USOH prior to these last couple of days.   Labs Potassium6.1* (01/05 1406) Mg pending Creatinine, ser  0.80 (01/05 1406) PLT  137* (01/05 1036) HGB  14.6 (01/05 1406) WBC 6.5 (01/05 1036) Troponin I (High Sensitivity)30* (01/05 1407).     Past Medical History:  Diagnosis Date   Abnormal LFTs    AICD (automatic cardioverter/defibrillator) present    Angina decubitus 11/06/2012   Angina pectoris ([place])    Anxiety    Arthritis    Back pain    Bundle branch block 05/2016   CAD (coronary artery disease) 06/18/2010   CHF (congestive heart failure) (HCC)    Chronic combined systolic and  diastolic CHF, NYHA class 3 (HCC)    Chronic combined systolic and diastolic heart failure ([place])    Chronic systolic dysfunction of left ventricle 09/24/2012   Coronary artery disease    s/p CABG 2007   Depression    Dermal mycosis    Diabetes mellitus    type II   DKA (diabetic ketoacidoses) 01/12/2019   DVT (deep venous thrombosis) ([place]) 09/2015   bilateral   Edema, lower extremity    Glaucoma    Hyperlipidemia    Hypertension    Hypoglycemia 09/09/2012   Insomnia    Insulin dependent diabetes mellitus ([place]) 04/23/2012   Ischemic cardiomyopathy    EF 30-35%, s/p ICD 4/08 by Dr Leonia Reeves   Joint pain    LBBB (left bundle branch block) 09/24/2012   Morbid obesity ([place]) 11/06/2012   Obesity    Oxygen dependent 2 L   Pain in left knee    Peripheral neuropathy    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   LIMA to D2, SVG to ramus intermediate, sequential SVG to OM1-OM2, SVG to RCA with EVH via both legs    Sleep apnea    uses O2 at night   Tinea    Type 2 diabetes mellitus with hyperglycemia, with long-term current use of insulin ([place]) 04/20/2019   Uncontrolled diabetes mellitus 02/2019   Ventricular tachycardia ([place]) 01/16/15   sustained VT terminated with ATP, CL 340 msec   Vitamin  B 12 deficiency 11/05/2018   Vitamin D deficiency 11/05/2018     Surgical History:  Past Surgical History:  Procedure Laterality Date   BI-VENTRICULAR IMPLANTABLE CARDIOVERTER DEFIBRILLATOR UPGRADE N/A 09/25/2012   Procedure: BI-VENTRICULAR IMPLANTABLE CARDIOVERTER DEFIBRILLATOR UPGRADE;  Surgeon: Coralyn Mark, MD;  Location[facility] C[facility] LAB;  Service: Cardiovascular;  Laterality: N/A;   BREAST BIOPSY Right 04/05/2014   CARDIAC CATHETERIZATION     CARDIAC DEFIBRILLATOR PLACEMENT  4/08   by Dr Leonia Reeves (MDT)   COLONOSCOPY WITH PROPOFOL N/A 10/12/2016   Procedure: COLONOSCOPY WITH PROPOFOL;  Surgeon: Doran Stabler, MD;  Location: [facility] ENDOSCOPY;  Service: Gastroenterology;  Laterality: N/A;   CORONARY ARTERY BYPASS GRAFT   10/05/2005   by Dr Cyndia Bent   IMPLANTABLE CARDIOVERTER DEFIBRILLATOR IMPLANT  09/25/12   attempt of upgrade to CRT-D unsuccessful due to CS anatomy, SJM Unify [place] device placed with LV port capped by Dr Rayann Heman   IR GENERIC HISTORICAL  10/03/2015   IR US GUIDE VASC ACCESS LEFT 10/03/2015 Jacqulynn Cadet, MD [facility]   IR GENERIC HISTORICAL  10/03/2015   IR VENO/EXT/UNI LEFT 10/03/2015 Jacqulynn Cadet, MD [facility]   IR GENERIC HISTORICAL  10/03/2015   IR VENOCAVAGRAM IVC 10/03/2015 Jacqulynn Cadet, MD [facility]   IR GENERIC HISTORICAL  10/03/2015   IR INFUSION THROMBOL VENOUS INITIAL (MS) 10/03/2015 Jacqulynn Cadet, MD [facility]   IR GENERIC HISTORICAL  10/03/2015   IR US GUIDE VASC ACCESS LEFT 10/03/2015 Jacqulynn Cadet, MD [facility]   IR GENERIC HISTORICAL  10/04/2015   IR THROMBECT VENO MECH MOD SED 10/04/2015 Sandi Mariscal, MD [facility]   IR GENERIC HISTORICAL  10/04/2015   IR TRANSCATH PLC STENT 1ST ART NOT LE CV CAR VERT CAR 10/04/2015 Sandi Mariscal, MD [facility]   IR GENERIC HISTORICAL  10/04/2015   IR THROMB F/U EVAL ART/VEN FINAL DAY (MS) 10/04/2015 Sandi Mariscal, MD [facility]   IR GENERIC HISTORICAL  11/02/2015   IR RADIOLOGIST EVAL & MGMT 11/02/2015 Sandi Mariscal, MD [facility]   IR RADIOLOGIST EVAL & MGMT  04/26/2016   LEFT HEART CATH AND CORS/GRAFTS ANGIOGRAPHY N/A 01/19/2019   Procedure: LEFT HEART CATH AND CORS/GRAFTS ANGIOGRAPHY;  Surgeon: Belva Crome, MD;  Location: [facility];  Service: Cardiovascular;  Laterality: N/A;     (Not in a hospital admission)   Inpatient Medications:   Allergies:  Allergies  Allergen Reactions   Metformin And Related Itching, Swelling and Other (See Comments)    Leg pain & swelling in legs   Ozempic (0.25 Or 0.5 Mg-Dose) [Semaglutide(0.25 Or 0.'5mg'$ -Dos)] Other (See Comments)    Pt with hx of pancreatitis    Rosuvastatin    Atorvastatin Calcium Itching and Rash   Levofloxacin Itching    Social History    Socioeconomic History   Marital status: Legally Separated    Spouse name: Not on file   Number of children: 3   Years of education: 17   Highest education level: Not on file  Occupational History   Occupation: retired    Fish farm manager: UNEMPLOYED  Tobacco Use   Smoking status: Never   Smokeless tobacco: Never  Vaping Use   Vaping Use: Never used  Substance and Sexual Activity   Alcohol use: No   Drug use: No   Sexual activity: Not Currently  Other Topics Concern   Not on file  Social History Narrative   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  11/04/15 Lives with son    caffeine - coffee, 1-2 cups daily   Social Determinants of Health   Financial Resource Strain: Not on file  Food Insecurity: No Food Insecurity (10/29/2021)   Hunger Vital Sign    Worried About Running Out of Food in the Last Year: Never true    Ran Out of Food in the Last Year: Never true  Transportation Needs: No Transportation Needs (10/29/2021)   PRAPARE - Hydrologist (Medical): No    Lack of Transportation (Non-Medical): No  Physical Activity: Not on file  Stress: Not on file  Social Connections: Not on file  Intimate Partner Violence: Not At Risk (10/30/2021)   Humiliation, Afraid, Rape, and Kick questionnaire    Fear of Current or Ex-Partner: No    Emotionally Abused: No    Physically Abused: No    Sexually Abused: No     Family History  Problem Relation Age of Onset   Diabetes Mother 46       died - HTN   Stroke Mother    High blood pressure Mother    Sudden death Mother    Obesity Mother    Other Other        No early family hx of CAD     Review of Systems: All other systems reviewed and are otherwise negative except as noted above.  Physical Exam: Vitals:   01/12/22 1000 01/12/22 1010 01/12/22 1147 01/12/22 1345  BP: (!) 128/112  (!) 156/77 (!) 125/110  Pulse: 83  70 62  Resp: 20  16 (!) 23  Temp: 99.9 F (37.7 C)   99.7 F (37.6 C)   TempSrc: Oral     SpO2: 99%  97% 100%  Weight:  105.2 kg    Height:  5' (1.524 m)      GEN- The patient is well appearing, alert and oriented x 3 today.   HEENT: normocephalic, atraumatic; sclera clear, conjunctiva pink; hearing intact; oropharynx clear; neck supple Lungs- Clear to ausculation bilaterally, normal work of breathing.  No wheezes, rales, rhonchi Heart- Regular rate and rhythm, no murmurs, rubs or gallops GI- soft, non-tender, non-distended, bowel sounds present Extremities- no clubbing or cyanosis. 2+ edema 1/2 way to knee; DP/PT/radial pulses 2+ bilaterally MS- no significant deformity or atrophy Skin- warm and dry, no rash or lesion Psych- euthymic mood, full affect Neuro- strength and sensation are intact  Labs:   Lab Results  Component Value Date   WBC 6.5 01/12/2022   HGB 14.6 01/12/2022   HCT 43.0 01/12/2022   MCV 95.3 01/12/2022   PLT 137 (L) 01/12/2022    Recent Labs  Lab 01/12/22 1406  NA 142  K 6.1*  CL 106  BUN 22  CREATININE 0.80  GLUCOSE 159*      Radiology/Studies: DG Chest Portable 1 View  Result Date: 01/12/2022 CLINICAL DATA:  Chest pain and shortness of breath EXAM: PORTABLE CHEST 1 VIEW COMPARISON:  Chest radiograph dated 10/29/2021 FINDINGS: Lines/tubes: Left chest wall ICD leads project over the right atrium and ventricle. Chest: Low lung volumes. Bibasilar patchy and linear opacities. Mild bilateral interstitial opacities. Pleura: No pneumothorax or pleural effusion. Heart/mediastinum: Similar enlarged cardiomediastinal silhouette. Bones: Median sternotomy wires are nondisplaced. IMPRESSION: 1. Low lung volumes with bibasilar atelectasis. 2. Mild bilateral interstitial opacities which may represent pulmonary edema. Electronically Signed   By: Darrin Nipper M.D.   On: 01/12/2022 13:59    EKG:on arrival showed NSR at 82  bpm (personally reviewed)  TELEMETRY: NSR V pacing in 60s (personally reviewed)  DEVICE HISTORY:   Abbott ICD implanted 05/01/2006 >> attempts at CRT upgrade and gen change 09/25/2012  (No suitable veins for LV lead) CRT device, though LV port is plugged + appropriate therapy 01/18/2019, VF  Assessment/Plan: 1.  VT/VF H/o appropriate therapy in 01/18/2019 as well.  K 6.1, Mg pending. Prudence Heiny follow K after giving lasix.  Consider AAD pending labs given this is her second episode (though many years between).  Increase coreg to 12.5 mg BID There have been concerns re: her QT in the past. Looks OK today. EKG auto-read as much longer than it is due to QRS.   2. Chronic systolic CHF  EF ~54% 06/5679, Update Volume status elevated. Dvon Jiles give dose of IV lasix today and re-assess.  Increase coreg as above Hold lisinopril today with elevated K.  Previously taken off spiro with AKI  3. ICM No s/s ischemia Cath in 2021 with patent grafts HS trop 30.   4. LBBB No suitable veins for upgrade with previous attempt.  Minimal RV pacing.  5. Obesity Body mass index is 45.31 kg/m.  Have encouraged weight loss.   6. H/o DVT On chronic eliquis  7. DM2 SSI ordered  Dr. Curt Bears has seen. Plan decision on AAD pending labwork, echo, and rhythm overnight.   For questions or updates, please contact [place] Please consult [email or web address] for contact info under Cardiology/STEMI.  Signed, Shirley Friar, PA-C  01/12/2022 3:12 PM   I have seen and examined this patient with Oda Kilts.  Agree with above, note added to reflect my findings.  Admitted to the hospital after ICD shock.  Patient was sleeping at the time.  Occurred in the middle of the night last night.  She currently feels well and is without complaint.  Last night, she did have a few episodes of vomiting before she went to bed.  On presentation to the emergency room, potassium 4.1.  I-STAT read potassium of 6.1.  Currently feeling well without further nausea or vomiting.  GEN: Well nourished, well developed, in no acute  distress  HEENT: normal  Neck: no JVD, carotid bruits, or masses Cardiac: RRR; no murmurs, rubs, or gallops,no edema  Respiratory:  clear to auscultation bilaterally, normal work of breathing GI: soft, nontender, nondistended, + BS MS: no deformity or atrophy  Skin: warm and dry, device site well healed Neuro:  Strength and sensation are intact Psych: euthymic mood, full affect   Ventricular fibrillation: Patient was unaware of ICD shock.  Labs are without abnormality.  High-sensitivity troponin not yet elevated but Paxtyn Wisdom trend.  Drae Mitzel plan to load on amiodarone.  If troponin becomes elevated, left heart catheterization would be a reasonable option. Acute on chronic systolic heart failure: Ejection fraction 50% in 2021.  Echo currently pending.  Has lower extremity edema.  Eunique Balik give Lasix 40 mg once.  Can be adjusted during hospitalization. Coronary artery disease: Catheterization in 2021 with patent grafts.  No current chest pain. Left bundle branch block: Had an attempted biventricular upgrade, though no suitable veins.  Paralee Pendergrass M. Aima Mcwhirt MD 01/12/2022 3:57 PM

## 2022-01-12 NOTE — ED Provider Triage Note (Signed)
Emergency Medicine Provider Triage Evaluation Note  [name] , a 72 y.o. female  was evaluated in triage.  Pt complains of her defibrillator going off this past Wednesday.  Was called by her cardiology office today and was notified that she apparently went into V-fib and was shocked.  Patient denies feeling any sensation or recognizing that this occurred.  Was not told what time of day this occurred.  States this happened once many years ago, which she also did not sense that the defibrillator went off.  Denies any active complaints such as chest pain, shortness of breath, fever, and/V/D, abdominal pain, back pain, or extremity weakness.  Review of Systems  Positive:  Negative: See above  Physical Exam  BP (!) 128/112 (BP Location: Right Arm)   Pulse 83   Temp 99.9 F (37.7 C) (Oral)   Resp 20   Ht 5' (1.524 m)   Wt 105.2 kg   SpO2 99%   BMI 45.31 kg/m  Gen:   Awake, no distress sitting comfortably Resp:  Normal effort, equal chest rise, communicates without difficulty MSK:   Moves extremities without difficulty  Other:  RR appears normal rate and rhythm via palpation and auscultation.  Communicates without difficulty.  Chest non-TTP , no crepitus or bony deformity.  Medical Decision Making  Medically screening exam initiated at 10:30 AM.  Appropriate orders placed.  [name] was informed that the remainder of the evaluation will be completed by another provider, this initial triage assessment does not replace that evaluation, and the importance of remaining in the ED until their evaluation is complete.     Jamie Rome, PA-C 87/57/97 1032

## 2022-01-12 NOTE — ED Notes (Signed)
Got patient on the monitor patient is resting with call bell in reach  ?

## 2022-01-12 NOTE — Telephone Encounter (Signed)
Device alert for sustained VT/VF with successful HV therapy Event occurred 1/3 '@02'$ :15, EGM show sustained VT/VF, rate 285 bpm, HV therapy 36J delivered converting to regular AS/VS Route to triage LA  Patient received 1 shock that was effective at 2:15am on Wed, Jan 3rd. Reviewed with patient.  She states she must have been asleep and does not recall feeling anything. Event was not witnessed. Son is present with her now and he is concerned.  She has been complaining of abdominal "ache" and has started with nausea Vomiting and diarrhea  X 1-2 days.   Given recent shock, concern for electrolyte abnormalities and patient/son level of concern, Patient is going to go on to [facility] ER.

## 2022-01-12 NOTE — ED Triage Notes (Addendum)
Pt. Stated, the defibrillator  went off Wednesday , they called me this morning. No symptoms I do have pain on lower part of my stomach cause I have a cyst

## 2022-01-12 NOTE — ED Provider Notes (Signed)
[facility] EMERGENCY DEPARTMENT Provider Note   CSN: [id number] Arrival date & time: 01/12/22  0355     History  Chief Complaint  Patient presents with   Abdominal Pain    Yara Alegandra Sommers is a 72 y.o. female.  Pt is a 72 yo female with a pmhx significant for OSA, ischemic CM DM, HTN, Obesity, and CHF (EF 35%).  Pt said she was called today and was told her AICD fired.  She does not know when it fired.  She said she feels fine.  She said it's gone off once in the past, but nothing recent.  Per notes on Epic, event occurred on 1/3 at 0215. She had sustained VT/VF rate at 285 bpm.  She received 36J shock and rhythm converted.  Pt denies cp, sob, abd pain.        Home Medications Prior to Admission medications   Medication Sig Start Date End Date Taking? Authorizing Provider  albuterol (PROVENTIL) (2.5 MG/3ML) 0.083% nebulizer solution Take 2.5 mg by nebulization every 6 (six) hours as needed for wheezing or shortness of breath.  09/30/15   [provider]  Alcohol Swabs (ALCOHOL WIPES) 70 % PADS DropSafe Alcohol Prep Pads  USE WHEN CHECKING BLOOD SUGARS    [provider]  Baclofen 5 MG TABS Take 5 mg by mouth daily.    [provider]  carvedilol (COREG) 6.25 MG tablet Take 1 tablet (6.25 mg total) by mouth 2 (two) times daily with a meal. 02/22/19   Geradine Girt, DO  Continuous Blood Gluc Receiver (DEXCOM G7 RECEIVER) DEVI 1 Device by Does not apply route as directed. 11/03/21   Shamleffer, Melanie Crazier, MD  Continuous Blood Gluc Sensor (DEXCOM G7 SENSOR) MISC 1 Device by Does not apply route as directed. 11/03/21   Shamleffer, Melanie Crazier, MD  cyanocobalamin (,VITAMIN B-12,) 1000 MCG/ML injection 1,000 mcg every 30 (thirty) days.    [provider]  dapagliflozin propanediol (FARXIGA) 10 MG TABS tablet Take 1 tablet (10 mg total) by mouth daily. 11/03/21   Shamleffer, Melanie Crazier, MD  ELIQUIS 5 MG TABS tablet TAKE  1 TABLET TWICE DAILY 01/10/22   Waynetta Sandy, MD  ferrous sulfate 325 (65 FE) MG EC tablet Take 325 mg by mouth every other day.    [provider]  furosemide (LASIX) 40 MG tablet TAKE 1 TABLET EVERY DAY Patient taking differently: Take 40 mg by mouth daily. 07/24/21   Jerline Pain, MD  gabapentin (NEURONTIN) 300 MG capsule Take 300 mg by mouth at bedtime. 12/23/20   [provider]  insulin degludec (TRESIBA FLEXTOUCH) 100 UNIT/ML FlexTouch Pen Inject 30 Units into the skin daily. 11/03/21   Shamleffer, Melanie Crazier, MD  Insulin Pen Needle 31G X 5 MM MISC 1 Device by Does not apply route in the morning, at noon, in the evening, and at bedtime. 11/03/21   Shamleffer, Melanie Crazier, MD  isosorbide mononitrate (IMDUR) 30 MG 24 hr tablet Take 30 mg by mouth daily. 02/14/21   [provider]  latanoprost (XALATAN) 0.005 % ophthalmic solution Administer 1 drop to both eyes nightly.    [provider]  lisinopril (ZESTRIL) 2.5 MG tablet Take 1 tablet by mouth daily. 02/22/21   [provider]  nitroGLYCERIN (NITROSTAT) 0.4 MG SL tablet Place 0.4 mg under the tongue every 5 (five) minutes as needed for chest pain.    [provider]  NOVOLOG FLEXPEN 100 UNIT/ML FlexPen INJECT PER  SLIDING SCALE AS DIRECTED , MAX 30 UNITS PER DAY. DISCARD PEN 28 DAYS AFTER OPENING 10/11/21   Shamleffer, Melanie Crazier, MD  potassium chloride SA (KLOR-CON) 20 MEQ tablet Take 20 mEq by mouth daily as needed (for low pottassium). 02/23/20   [provider]  rosuvastatin (CRESTOR) 20 MG tablet Take 1 tablet (20 mg total) by mouth daily at 6 PM. 10/07/15   Minus Liberty, MD  TRUE METRIX BLOOD GLUCOSE TEST test strip TEST THREE TIMES DAILY 12/11/21   Shamleffer, Melanie Crazier, MD  Vitamin D, Ergocalciferol, (DRISDOL) 1.25 MG (50000 UT) CAPS capsule Take 1 capsule (50,000 Units total) by mouth every 7 (seven) days. 12/30/18   Jearld Lesch A, DO       Allergies    Metformin and related, Ozempic (0.25 or 0.5 mg-dose) [semaglutide(0.25 or 0.'5mg'$ -dos)], Rosuvastatin, Atorvastatin calcium, and Levofloxacin    Review of Systems   Review of Systems  All other systems reviewed and are negative.   Physical Exam Updated Vital Signs BP (!) 125/110   Pulse (!) 59   Temp 98.5 F (36.9 C)   Resp (!) 24   Ht 5' (1.524 m)   Wt 105.2 kg   SpO2 100%   BMI 45.31 kg/m  Physical Exam Vitals and nursing note reviewed.  Constitutional:      Appearance: She is well-developed. She is obese.  HENT:     Head: Normocephalic and atraumatic.     Mouth/Throat:     Mouth: Mucous membranes are moist.     Pharynx: Oropharynx is clear.  Eyes:     Extraocular Movements: Extraocular movements intact.     Pupils: Pupils are equal, round, and reactive to light.  Cardiovascular:     Rate and Rhythm: Normal rate and regular rhythm.     Heart sounds: Normal heart sounds.  Pulmonary:     Effort: Pulmonary effort is normal.     Breath sounds: Normal breath sounds.  Abdominal:     General: Abdomen is flat. Bowel sounds are normal.     Palpations: Abdomen is soft.  Musculoskeletal:     Right lower leg: Edema present.     Left lower leg: Edema present.  Skin:    General: Skin is warm.     Capillary Refill: Capillary refill takes less than 2 seconds.  Neurological:     General: No focal deficit present.     Mental Status: She is alert and oriented to person, place, and time.  Psychiatric:        Mood and Affect: Mood normal.        Behavior: Behavior normal.     ED Results / Procedures / Treatments   Labs (all labs ordered are listed, but only abnormal results are displayed) Labs Reviewed  BASIC METABOLIC PANEL - Abnormal; Notable for the following components:      Result Value   CO2 21 (*)    Glucose, Bld 169 (*)    All other components within normal limits  CBC WITH DIFFERENTIAL/PLATELET - Abnormal; Notable for the following components:    RDW 16.0 (*)    Platelets 137 (*)    All other components within normal limits  I-STAT CHEM 8, ED - Abnormal; Notable for the following components:   Potassium 6.1 (*)    Glucose, Bld 159 (*)    Calcium, Ion 1.13 (*)    All other components within normal limits  TROPONIN I (HIGH SENSITIVITY) - Abnormal; Notable for the following components:  Troponin I (High Sensitivity) 30 (*)    All other components within normal limits  RESP PANEL BY RT-PCR (RSV, FLU A&B, COVID)  RVPGX2  MAGNESIUM  URINALYSIS, ROUTINE W REFLEX MICROSCOPIC  BASIC METABOLIC PANEL  I-STAT CHEM 8, ED  TROPONIN I (HIGH SENSITIVITY)    EKG EKG Interpretation  Date/Time:  Friday January 12 2022 09:45:00 EST Ventricular Rate:  82 PR Interval:  198 QRS Duration: 148 QT Interval:  452 QTC Calculation: 528 R Axis:   159 Text Interpretation: Normal sinus rhythm Non-specific intra-ventricular conduction block Lateral infarct , age undetermined Abnormal ECG When compared with ECG of 31-Oct-2021 07:15, PREVIOUS ECG IS PRESENT No significant change since last tracing Confirmed by Isla Pence [phone]) on 01/12/2022 1:39:43 PM  Radiology DG Chest Portable 1 View  Result Date: 01/12/2022 CLINICAL DATA:  Chest pain and shortness of breath EXAM: PORTABLE CHEST 1 VIEW COMPARISON:  Chest radiograph dated 10/29/2021 FINDINGS: Lines/tubes: Left chest wall ICD leads project over the right atrium and ventricle. Chest: Low lung volumes. Bibasilar patchy and linear opacities. Mild bilateral interstitial opacities. Pleura: No pneumothorax or pleural effusion. Heart/mediastinum: Similar enlarged cardiomediastinal silhouette. Bones: Median sternotomy wires are nondisplaced. IMPRESSION: 1. Low lung volumes with bibasilar atelectasis. 2. Mild bilateral interstitial opacities which may represent pulmonary edema. Electronically Signed   By: Darrin Nipper M.D.   On: 01/12/2022 13:59    Procedures Procedures    Medications Ordered in  ED Medications  sodium chloride flush (NS) 0.9 % injection 3 mL (3 mLs Intravenous Given 01/12/22 1537)  sodium chloride flush (NS) 0.9 % injection 3 mL (has no administration in time range)  0.9 %  sodium chloride infusion (has no administration in time range)  acetaminophen (TYLENOL) tablet 650 mg (has no administration in time range)  isosorbide mononitrate (IMDUR) 24 hr tablet 30 mg (30 mg Oral Given 01/12/22 1618)  nitroGLYCERIN (NITROSTAT) SL tablet 0.4 mg (has no administration in time range)  rosuvastatin (CRESTOR) tablet 20 mg (has no administration in time range)  carvedilol (COREG) tablet 12.5 mg (12.5 mg Oral Given 01/12/22 1618)  dapagliflozin propanediol (FARXIGA) tablet 10 mg (has no administration in time range)  apixaban (ELIQUIS) tablet 5 mg (has no administration in time range)  latanoprost (XALATAN) 0.005 % ophthalmic solution 1 drop (has no administration in time range)  insulin aspart (novoLOG) injection 0-15 Units (has no administration in time range)  insulin aspart (novoLOG) injection 0-5 Units (has no administration in time range)  amiodarone (NEXTERONE) 1.8 mg/mL load via infusion 150 mg (150 mg Intravenous Bolus from Bag 01/12/22 1618)    Followed by  amiodarone (NEXTERONE PREMIX) 360-4.14 MG/200ML-% (1.8 mg/mL) IV infusion (60 mg/hr Intravenous New Bag/Given 01/12/22 1617)    Followed by  amiodarone (NEXTERONE PREMIX) 360-4.14 MG/200ML-% (1.8 mg/mL) IV infusion (has no administration in time range)  furosemide (LASIX) injection 80 mg (80 mg Intravenous Given 01/12/22 1537)    ED Course/ Medical Decision Making/ A&P                           Medical Decision Making Amount and/or Complexity of Data Reviewed Labs: ordered. Radiology: ordered.  Risk Decision regarding hospitalization.   This patient presents to the ED for concern of aicd shock, this involves an extensive number of treatment options, and is a complaint that carries with it a high risk of complications  and morbidity.  The differential diagnosis includes electrolyte abn, infection, mi   Co morbidities  that complicate the patient evaluation  OSA, ischemic CM DM, HTN, Obesity, and CHF (EF 35%)   Additional history obtained:  Additional history obtained from epic chart review External records from outside source obtained and reviewed including family   Lab Tests:  I Ordered, and personally interpreted labs.  The pertinent results include:  cbc nl, istat with k 6.1 (possibly hemolyzed; will repeat);  repeat with K 4.1;  trop 30; covid/flu neg   Imaging Studies ordered:  I ordered imaging studies including cxr  I independently visualized and interpreted imaging which showed  . Low lung volumes with bibasilar atelectasis.  2. Mild bilateral interstitial opacities which may represent  pulmonary edema.   I agree with the radiologist interpretation   Cardiac Monitoring:  The patient was maintained on a cardiac monitor.  I personally viewed and interpreted the cardiac monitored which showed an underlying rhythm of: nsr   Medicines ordered and prescription drug management:  I ordered medication including lasix  for chf  Reevaluation of the patient after these medicines showed that the patient improved I have reviewed the patients home medicines and have made adjustments as needed  Consultations Obtained:  I requested consultation with the cardiologist,  and discussed lab and imaging findings as well as pertinent plan - they will admit   Problem List / ED Course:  Vfib:  appropriate shock.  Cards will admit. CHf:  pt given lasix   Reevaluation:  After the interventions noted above, I reevaluated the patient and found that they have :improved   Social Determinants of Health:  Lives at home   Dispostion:  After consideration of the diagnostic results and the patients response to treatment, I feel that the patent would benefit from admission.    CRITICAL  CARE Performed by: Isla Pence   Total critical care time: 30 minutes  Critical care time was exclusive of separately billable procedures and treating other patients.  Critical care was necessary to treat or prevent imminent or life-threatening deterioration.  Critical care was time spent personally by me on the following activities: development of treatment plan with patient and/or surrogate as well as nursing, discussions with consultants, evaluation of patient's response to treatment, examination of patient, obtaining history from patient or surrogate, ordering and performing treatments and interventions, ordering and review of laboratory studies, ordering and review of radiographic studies, pulse oximetry and re-evaluation of patient's condition.         Final Clinical Impression(s) / ED Diagnoses Final diagnoses:  Ventricular fibrillation ([place])  Acute on chronic congestive heart failure, unspecified heart failure type [facility])    Rx / DC Orders ED Discharge Orders     None         Isla Pence, MD 01/12/22 1622

## 2022-01-13 ENCOUNTER — Observation Stay (HOSPITAL_BASED_OUTPATIENT_CLINIC_OR_DEPARTMENT_OTHER): Payer: Medicare HMO

## 2022-01-13 DIAGNOSIS — I472 Ventricular tachycardia, unspecified: Secondary | ICD-10-CM | POA: Diagnosis present

## 2022-01-13 DIAGNOSIS — Z7901 Long term (current) use of anticoagulants: Secondary | ICD-10-CM | POA: Diagnosis not present

## 2022-01-13 DIAGNOSIS — E875 Hyperkalemia: Secondary | ICD-10-CM | POA: Diagnosis present

## 2022-01-13 DIAGNOSIS — Z951 Presence of aortocoronary bypass graft: Secondary | ICD-10-CM | POA: Diagnosis not present

## 2022-01-13 DIAGNOSIS — Z8249 Family history of ischemic heart disease and other diseases of the circulatory system: Secondary | ICD-10-CM | POA: Diagnosis not present

## 2022-01-13 DIAGNOSIS — Z823 Family history of stroke: Secondary | ICD-10-CM | POA: Diagnosis not present

## 2022-01-13 DIAGNOSIS — Z1152 Encounter for screening for COVID-19: Secondary | ICD-10-CM | POA: Diagnosis not present

## 2022-01-13 DIAGNOSIS — I110 Hypertensive heart disease with heart failure: Secondary | ICD-10-CM | POA: Diagnosis present

## 2022-01-13 DIAGNOSIS — Z833 Family history of diabetes mellitus: Secondary | ICD-10-CM | POA: Diagnosis not present

## 2022-01-13 DIAGNOSIS — E785 Hyperlipidemia, unspecified: Secondary | ICD-10-CM | POA: Diagnosis present

## 2022-01-13 DIAGNOSIS — I447 Left bundle-branch block, unspecified: Secondary | ICD-10-CM | POA: Diagnosis present

## 2022-01-13 DIAGNOSIS — I4901 Ventricular fibrillation: Secondary | ICD-10-CM | POA: Diagnosis present

## 2022-01-13 DIAGNOSIS — E119 Type 2 diabetes mellitus without complications: Secondary | ICD-10-CM | POA: Diagnosis present

## 2022-01-13 DIAGNOSIS — G4733 Obstructive sleep apnea (adult) (pediatric): Secondary | ICD-10-CM | POA: Diagnosis present

## 2022-01-13 DIAGNOSIS — Z6841 Body Mass Index (BMI) 40.0 and over, adult: Secondary | ICD-10-CM | POA: Diagnosis not present

## 2022-01-13 DIAGNOSIS — Z95810 Presence of automatic (implantable) cardiac defibrillator: Secondary | ICD-10-CM | POA: Diagnosis not present

## 2022-01-13 DIAGNOSIS — I5022 Chronic systolic (congestive) heart failure: Secondary | ICD-10-CM

## 2022-01-13 DIAGNOSIS — I25110 Atherosclerotic heart disease of native coronary artery with unstable angina pectoris: Secondary | ICD-10-CM | POA: Diagnosis present

## 2022-01-13 DIAGNOSIS — Z9981 Dependence on supplemental oxygen: Secondary | ICD-10-CM | POA: Diagnosis not present

## 2022-01-13 DIAGNOSIS — I3481 Nonrheumatic mitral (valve) annulus calcification: Secondary | ICD-10-CM | POA: Diagnosis present

## 2022-01-13 DIAGNOSIS — Z86718 Personal history of other venous thrombosis and embolism: Secondary | ICD-10-CM | POA: Diagnosis not present

## 2022-01-13 DIAGNOSIS — I2510 Atherosclerotic heart disease of native coronary artery without angina pectoris: Secondary | ICD-10-CM | POA: Diagnosis not present

## 2022-01-13 DIAGNOSIS — I255 Ischemic cardiomyopathy: Secondary | ICD-10-CM | POA: Diagnosis present

## 2022-01-13 DIAGNOSIS — Z794 Long term (current) use of insulin: Secondary | ICD-10-CM | POA: Diagnosis not present

## 2022-01-13 DIAGNOSIS — I5042 Chronic combined systolic (congestive) and diastolic (congestive) heart failure: Secondary | ICD-10-CM | POA: Diagnosis present

## 2022-01-13 LAB — ECHOCARDIOGRAM COMPLETE
AR max vel: 1.76 cm2
AV Area VTI: 1.71 cm2
AV Area mean vel: 1.6 cm2
AV Mean grad: 6 mmHg
AV Peak grad: 10 mmHg
Ao pk vel: 1.59 m/s
Area-P 1/2: 2.82 cm2
Calc EF: 36.1 %
Height: 60 in
S' Lateral: 4.2 cm
Single Plane A2C EF: 29.7 %
Single Plane A4C EF: 36.4 %
Weight: 3714.31 oz

## 2022-01-13 LAB — BASIC METABOLIC PANEL
Anion gap: 9 (ref 5–15)
BUN: 21 mg/dL (ref 8–23)
CO2: 26 mmol/L (ref 22–32)
Calcium: 9.3 mg/dL (ref 8.9–10.3)
Chloride: 102 mmol/L (ref 98–111)
Creatinine, Ser: 1.09 mg/dL — ABNORMAL HIGH (ref 0.44–1.00)
GFR, Estimated: 54 mL/min — ABNORMAL LOW (ref >60)
Glucose, Bld: 194 mg/dL — ABNORMAL HIGH (ref 70–99)
Potassium: 3.4 mmol/L — ABNORMAL LOW (ref 3.5–5.1)
Sodium: 137 mmol/L (ref 135–145)

## 2022-01-13 LAB — GLUCOSE, CAPILLARY
Glucose-Capillary: 170 mg/dL — ABNORMAL HIGH (ref 70–99)
Glucose-Capillary: 239 mg/dL — ABNORMAL HIGH (ref 70–99)
Glucose-Capillary: 267 mg/dL — ABNORMAL HIGH (ref 70–99)
Glucose-Capillary: 245 mg/dL — ABNORMAL HIGH (ref 70–99)

## 2022-01-13 LAB — MAGNESIUM: Magnesium: 2 mg/dL (ref 1.7–2.4)

## 2022-01-13 MED ORDER — POTASSIUM CHLORIDE CRYS ER 20 MEQ PO TBCR
40.0000 meq | EXTENDED_RELEASE_TABLET | Freq: Two times a day (BID) | ORAL | Status: AC
  Administered 2022-01-13 (×2): 40 meq via ORAL
  Filled 2022-01-13 (×2): qty 2

## 2022-01-13 MED ORDER — PERFLUTREN LIPID MICROSPHERE
1.0000 mL | INTRAVENOUS | Status: AC | PRN
  Administered 2022-01-13: 3 mL via INTRAVENOUS

## 2022-01-13 MED ORDER — FUROSEMIDE 10 MG/ML IJ SOLN
80.0000 mg | Freq: Once | INTRAMUSCULAR | Status: AC
  Administered 2022-01-13: 80 mg via INTRAVENOUS
  Filled 2022-01-13: qty 8

## 2022-01-13 NOTE — Progress Notes (Signed)
EP Rounding Note    Patient Name: [name] Date of Encounter: 01/13/2022  [place] Cardiologist: Candee Furbish, MD   Subjective   NAEO. No complaints this AM.   Inpatient Medications    Scheduled Meds:  apixaban  5 mg Oral BID   carvedilol  12.5 mg Oral BID WC   dapagliflozin propanediol  10 mg Oral Daily   insulin aspart  0-15 Units Subcutaneous TID WC   insulin aspart  0-5 Units Subcutaneous QHS   isosorbide mononitrate  30 mg Oral Daily   rosuvastatin  20 mg Oral q1800   sodium chloride flush  3 mL Intravenous Q12H   Continuous Infusions:  sodium chloride     amiodarone 30 mg/hr (01/13/22 0435)   PRN Meds: sodium chloride, acetaminophen, latanoprost, nitroGLYCERIN, perflutren lipid microspheres (DEFINITY) IV suspension, sodium chloride flush   Vital Signs    Vitals:   01/12/22 1600 01/12/22 1620 01/12/22 2320 01/13/22 0608  BP:  132/89 (!) 126/52 (!) 118/52  Pulse: (!) 59 (!) 58 (!) 59 (!) 51  Resp: (!) 24 (!) 24 (!) 24 (!) 22  Temp: 98.5 F (36.9 C)  98.9 F (37.2 C) 98.3 F (36.8 C)  TempSrc:   Oral Oral  SpO2: 100% 100% 97% 97%  Weight:    105.3 kg  Height:        Intake/Output Summary (Last 24 hours) at 01/13/2022 0853 Last data filed at 01/13/2022 0435 Gross per 24 hour  Intake 560.03 ml  Output 600 ml  Net -39.97 ml      01/13/2022    6:08 AM 01/12/2022   10:10 AM 11/03/2021    9:42 AM  Last 3 Weights  Weight (lbs) 232 lb 2.3 oz 232 lb 232 lb  Weight (kg) 105.3 kg 105.235 kg 105.235 kg      Telemetry    Personally Reviewed  ECG    Personally Reviewed  Physical Exam   GEN: No acute distress.   Obese. Neck: No JVD Cardiac: RRR, no murmurs, rubs, or gallops.  Respiratory: Clear to auscultation bilaterally. GI: Soft, nontender, non-distended  Jamie: No edema; No deformity. Neuro:  Nonfocal  Psych: Normal affect   Labs    High Sensitivity Troponin:   Recent Labs  Lab 01/12/22 1407 01/12/22 1750  TROPONINIHS 30*  31*     Chemistry Recent Labs  Lab 01/12/22 1036 01/12/22 1406 01/12/22 2101 01/13/22 0050  NA 140 142 137 137  K 4.1 6.1* 3.7 3.4*  CL 107 106 103 102  CO2 21*  --  27 26  GLUCOSE 169* 159* 255* 194*  BUN '15 22 18 21  '$ CREATININE 0.90 0.80 1.13* 1.09*  CALCIUM 9.8  --  9.4 9.3  MG 2.0  --   --  2.0  GFRNONAA >60  --  52* 54*  ANIONGAP 12  --  7 9    Lipids No results for input(s): "CHOL", "TRIG", "HDL", "LABVLDL", "LDLCALC", "CHOLHDL" in the last 168 hours.  Hematology Recent Labs  Lab 01/12/22 1036 01/12/22 1406  WBC 6.5  --   RBC 4.68  --   HGB 14.0 14.6  HCT 44.6 43.0  MCV 95.3  --   MCH 29.9  --   MCHC 31.4  --   RDW 16.0*  --   PLT 137*  --    Thyroid No results for input(s): "TSH", "FREET4" in the last 168 hours.  BNPNo results for input(s): "BNP", "PROBNP" in the last 168 hours.  DDimer No results for input(s): "DDIMER" in the last 168 hours.   Radiology    DG Chest Portable 1 View  Result Date: 01/12/2022 CLINICAL DATA:  Chest pain and shortness of breath EXAM: PORTABLE CHEST 1 VIEW COMPARISON:  Chest radiograph dated 10/29/2021 FINDINGS: Lines/tubes: Left chest wall ICD leads project over the right atrium and ventricle. Chest: Low lung volumes. Bibasilar patchy and linear opacities. Mild bilateral interstitial opacities. Pleura: No pneumothorax or pleural effusion. Heart/mediastinum: Similar enlarged cardiomediastinal silhouette. Bones: Median sternotomy wires are nondisplaced. IMPRESSION: 1. Low lung volumes with bibasilar atelectasis. 2. Mild bilateral interstitial opacities which may represent pulmonary edema. Electronically Signed   By: Darrin Nipper M.D.   On: 01/12/2022 13:59    Cardiac Studies   Echo pending   Assessment & Plan    [name] is a 72yo woman with chronci systolic heart failure admitted after ICD shock. She has been started on amiodarone.   #VF #ICD shock Doing well on amiodarone. Continue IV for 24 more hours, then transition to oral  for 24 hours. Keep K>4, Mg>2  #Chronic systolic heart failure #ICM #CAD s/p CABG EF 50% in 2021. Repeat pending. NYHA III Continue coreg, farxiga, lasix, imdur Redose IV lasix today.   For questions or updates, please contact [place] Please consult [email or web address] for contact info under        Signed, Vickie Epley, MD  01/13/2022, 8:53 AM

## 2022-01-13 NOTE — Progress Notes (Signed)
  Echocardiogram 2D Echocardiogram has been performed.  [name] 01/13/2022, 8:40 AM

## 2022-01-14 DIAGNOSIS — I472 Ventricular tachycardia, unspecified: Secondary | ICD-10-CM | POA: Diagnosis not present

## 2022-01-14 LAB — GLUCOSE, CAPILLARY
Glucose-Capillary: 174 mg/dL — ABNORMAL HIGH (ref 70–99)
Glucose-Capillary: 445 mg/dL — ABNORMAL HIGH (ref 70–99)
Glucose-Capillary: 217 mg/dL — ABNORMAL HIGH (ref 70–99)
Glucose-Capillary: 127 mg/dL — ABNORMAL HIGH (ref 70–99)
Glucose-Capillary: 176 mg/dL — ABNORMAL HIGH (ref 70–99)

## 2022-01-14 LAB — BASIC METABOLIC PANEL
Anion gap: 8 (ref 5–15)
BUN: 22 mg/dL (ref 8–23)
CO2: 28 mmol/L (ref 22–32)
Calcium: 9.3 mg/dL (ref 8.9–10.3)
Chloride: 99 mmol/L (ref 98–111)
Creatinine, Ser: 1.09 mg/dL — ABNORMAL HIGH (ref 0.44–1.00)
GFR, Estimated: 54 mL/min — ABNORMAL LOW (ref >60)
Glucose, Bld: 136 mg/dL — ABNORMAL HIGH (ref 70–99)
Potassium: 3.8 mmol/L (ref 3.5–5.1)
Sodium: 135 mmol/L (ref 135–145)

## 2022-01-14 LAB — MAGNESIUM: Magnesium: 2 mg/dL (ref 1.7–2.4)

## 2022-01-14 MED ORDER — POTASSIUM CHLORIDE CRYS ER 20 MEQ PO TBCR
20.0000 meq | EXTENDED_RELEASE_TABLET | Freq: Once | ORAL | Status: AC
  Administered 2022-01-14: 20 meq via ORAL
  Filled 2022-01-14: qty 1

## 2022-01-14 MED ORDER — AMIODARONE HCL 200 MG PO TABS
400.0000 mg | ORAL_TABLET | Freq: Every day | ORAL | Status: DC
  Administered 2022-01-14 – 2022-01-17 (×4): 400 mg via ORAL
  Filled 2022-01-14 (×4): qty 2

## 2022-01-14 MED ORDER — SODIUM CHLORIDE 0.9% FLUSH
3.0000 mL | Freq: Two times a day (BID) | INTRAVENOUS | Status: DC
  Administered 2022-01-14 – 2022-01-16 (×5): 3 mL via INTRAVENOUS

## 2022-01-14 MED ORDER — SODIUM CHLORIDE 0.9 % IV SOLN: INTRAVENOUS | Status: DC

## 2022-01-14 MED ORDER — SODIUM CHLORIDE 0.9 % IV SOLN: 250.0000 mL | INTRAVENOUS | Status: DC | PRN

## 2022-01-14 MED ORDER — ASPIRIN 81 MG PO CHEW
81.0000 mg | CHEWABLE_TABLET | ORAL | Status: AC
  Administered 2022-01-15: 81 mg via ORAL
  Filled 2022-01-14: qty 1

## 2022-01-14 MED ORDER — HEPARIN (PORCINE) 25000 UT/250ML-% IV SOLN
1200.0000 [IU]/h | INTRAVENOUS | Status: DC
  Administered 2022-01-14: 1200 [IU]/h via INTRAVENOUS
  Filled 2022-01-14: qty 250

## 2022-01-14 MED ORDER — SODIUM CHLORIDE 0.9% FLUSH: 3.0000 mL | INTRAVENOUS | Status: DC | PRN

## 2022-01-14 NOTE — Progress Notes (Signed)
EP Rounding Note    Patient Name: [name] Date of Encounter: 01/14/2022  [place] Cardiologist: Candee Furbish, MD   Subjective   NAEO. EF yesterday newly reduced.   Inpatient Medications    Scheduled Meds:  amiodarone  400 mg Oral Daily   carvedilol  12.5 mg Oral BID WC   dapagliflozin propanediol  10 mg Oral Daily   insulin aspart  0-15 Units Subcutaneous TID WC   insulin aspart  0-5 Units Subcutaneous QHS   isosorbide mononitrate  30 mg Oral Daily   rosuvastatin  20 mg Oral q1800   sodium chloride flush  3 mL Intravenous Q12H   Continuous Infusions:  sodium chloride     PRN Meds: sodium chloride, acetaminophen, latanoprost, nitroGLYCERIN, sodium chloride flush   Vital Signs    Vitals:   01/13/22 1127 01/13/22 1612 01/14/22 0311 01/14/22 0806  BP: (!) 126/58 (!) 109/52 (!) 122/47 (!) 144/62  Pulse: (!) 55 (!) 55  (!) 57  Resp: (!) 25 (!) '25 20 20  '$ Temp: 98.8 F (37.1 C) 97.8 F (36.6 C) 98.5 F (36.9 C) 98 F (36.7 C)  TempSrc: Oral Oral Oral Oral  SpO2: 94% 96% 93% 94%  Weight:      Height:        Intake/Output Summary (Last 24 hours) at 01/14/2022 0957 Last data filed at 01/14/2022 1025 Gross per 24 hour  Intake 600 ml  Output 1252 ml  Net -652 ml       01/13/2022    6:08 AM 01/12/2022   10:10 AM 11/03/2021    9:42 AM  Last 3 Weights  Weight (lbs) 232 lb 2.3 oz 232 lb 232 lb  Weight (kg) 105.3 kg 105.235 kg 105.235 kg      Telemetry    Personally Reviewed  ECG    Personally Reviewed  Physical Exam   GEN: No acute distress.   Obese. Neck: No JVD Cardiac: RRR, no murmurs, rubs, or gallops.  Respiratory: Clear to auscultation bilaterally. GI: Soft, nontender, non-distended  MS: No edema; No deformity. Neuro:  Nonfocal  Psych: Normal affect   Labs    High Sensitivity Troponin:   Recent Labs  Lab 01/12/22 1407 01/12/22 1750  TROPONINIHS 30* 31*      Chemistry Recent Labs  Lab 01/12/22 1036 01/12/22 1406  01/12/22 2101 01/13/22 0050 01/14/22 0052  NA 140   < > 137 137 135  K 4.1   < > 3.7 3.4* 3.8  CL 107   < > 103 102 99  CO2 21*  --  '27 26 28  '$ GLUCOSE 169*   < > 255* 194* 136*  BUN 15   < > '18 21 22  '$ CREATININE 0.90   < > 1.13* 1.09* 1.09*  CALCIUM 9.8  --  9.4 9.3 9.3  MG 2.0  --   --  2.0 2.0  GFRNONAA >60  --  52* 54* 54*  ANIONGAP 12  --  '7 9 8   '$ < > = values in this interval not displayed.     Lipids No results for input(s): "CHOL", "TRIG", "HDL", "LABVLDL", "LDLCALC", "CHOLHDL" in the last 168 hours.  Hematology Recent Labs  Lab 01/12/22 1036 01/12/22 1406  WBC 6.5  --   RBC 4.68  --   HGB 14.0 14.6  HCT 44.6 43.0  MCV 95.3  --   MCH 29.9  --   MCHC 31.4  --   RDW 16.0*  --  PLT 137*  --     Thyroid No results for input(s): "TSH", "FREET4" in the last 168 hours.  BNPNo results for input(s): "BNP", "PROBNP" in the last 168 hours.  DDimer No results for input(s): "DDIMER" in the last 168 hours.   Radiology    ECHOCARDIOGRAM COMPLETE  Result Date: 01/13/2022    ECHOCARDIOGRAM REPORT   Patient Name:   KIMORAH RIDOLFI Date of Exam: 01/13/2022 Medical Rec #:  [id number]        Height:       60.0 in Accession #:    [id number]       Weight:       232.1 lb Date of Birth:  1950-09-08        BSA:          1.989 m Patient Age:    69 years         BP:           118/52 mmHg Patient Gender: F                HR:           53 bpm. Exam Location:  Inpatient Procedure: 2D Echo and Intracardiac Opacification Agent Indications:    ventricilar tachycardia  History:        Patient has prior history of Echocardiogram examinations, most                 recent 01/13/2019. CAD, Arrythmias:Tachycardia; Risk                 Factors:Diabetes, Hypertension and Dyslipidemia.  Sonographer:    Harvie Junior Referring Phys: [id number] MICHAEL ANDREW Chalmers Cater  Sonographer Comments: Technically difficult study due to poor echo windows and patient is obese. Image acquisition challenging due to patient body  habitus. IMPRESSIONS  1. Left ventricular ejection fraction, by estimation, is 25 to 30%. The left ventricle has severely decreased function. The left ventricle demonstrates global hypokinesis. There is moderate left ventricular hypertrophy. Left ventricular diastolic parameters are consistent with Grade I diastolic dysfunction (impaired relaxation).  2. Right ventricular systolic function is mildly reduced. The right ventricular size is mildly enlarged. There is normal pulmonary artery systolic pressure. The estimated right ventricular systolic pressure is 32.2 mmHg.  3. The mitral valve is grossly normal. No evidence of mitral valve regurgitation. No evidence of mitral stenosis.  4. The aortic valve was not well visualized. Aortic valve regurgitation is not visualized. Aortic valve sclerosis/calcification is present, without any evidence of aortic stenosis.  5. The inferior vena cava is normal in size with greater than 50% respiratory variability, suggesting right atrial pressure of 3 mmHg. Comparison(s): Changes from prior study are noted. LVEF worsened from 50% in 2021 to 25% now. FINDINGS  Left Ventricle: Left ventricular ejection fraction, by estimation, is 25 to 30%. The left ventricle has severely decreased function. The left ventricle demonstrates global hypokinesis. Definity contrast agent was given IV to delineate the left ventricular endocardial borders. The left ventricular internal cavity size was normal in size. There is moderate left ventricular hypertrophy. Left ventricular diastolic parameters are consistent with Grade I diastolic dysfunction (impaired relaxation). Right Ventricle: The right ventricular size is mildly enlarged. Right vetricular wall thickness was not well visualized. Right ventricular systolic function is mildly reduced. There is normal pulmonary artery systolic pressure. The tricuspid regurgitant velocity is 1.83 m/s, and with an assumed right atrial pressure of 3 mmHg, the  estimated right ventricular systolic pressure is 02.5 mmHg. Left  Atrium: Left atrial size was normal in size. Right Atrium: Right atrial size was normal in size. Pericardium: There is no evidence of pericardial effusion. Mitral Valve: The mitral valve is grossly normal. There is mild thickening of the mitral valve leaflet(s). There is mild calcification of the mitral valve leaflet(s). Mild mitral annular calcification. No evidence of mitral valve regurgitation. No evidence of mitral valve stenosis. Tricuspid Valve: The tricuspid valve is not well visualized. Tricuspid valve regurgitation is not demonstrated. No evidence of tricuspid stenosis. Aortic Valve: The aortic valve was not well visualized. Aortic valve regurgitation is not visualized. Aortic valve sclerosis/calcification is present, without any evidence of aortic stenosis. Aortic valve mean gradient measures 6.0 mmHg. Aortic valve peak gradient measures 10.0 mmHg. Aortic valve area, by VTI measures 1.71 cm. Pulmonic Valve: The pulmonic valve was not well visualized. Pulmonic valve regurgitation is mild. No evidence of pulmonic stenosis. Aorta: The aortic root is normal in size and structure. Venous: The inferior vena cava is normal in size with greater than 50% respiratory variability, suggesting right atrial pressure of 3 mmHg. IAS/Shunts: The interatrial septum was not well visualized.  LEFT VENTRICLE PLAX 2D LVIDd:         5.70 cm      Diastology LVIDs:         4.20 cm      LV e' medial:    4.03 cm/s LV PW:         1.30 cm      LV E/e' medial:  21.3 LV IVS:        1.30 cm      LV e' lateral:   6.09 cm/s LVOT diam:     2.20 cm      LV E/e' lateral: 14.1 LV SV:         71 LV SV Index:   36 LVOT Area:     3.80 cm  LV Volumes (MOD) LV vol d, MOD A2C: 90.3 ml LV vol d, MOD A4C: 116.0 ml LV vol s, MOD A2C: 63.5 ml LV vol s, MOD A4C: 73.8 ml LV SV MOD A2C:     26.8 ml LV SV MOD A4C:     116.0 ml LV SV MOD BP:      39.0 ml RIGHT VENTRICLE RV S prime:     7.40  cm/s TAPSE (M-mode): 1.9 cm LEFT ATRIUM             Index LA diam:        4.40 cm 2.21 cm/m LA Vol (A2C):   50.0 ml 25.14 ml/m LA Vol (A4C):   39.8 ml 20.01 ml/m LA Biplane Vol: 45.8 ml 23.03 ml/m  AORTIC VALVE                     PULMONIC VALVE AV Area (Vmax):    1.76 cm      PV Vmax:          1.10 m/s AV Area (Vmean):   1.60 cm      PV Peak grad:     4.8 mmHg AV Area (VTI):     1.71 cm      PR End Diast Vel: 3.03 msec AV Vmax:           158.50 cm/s AV Vmean:          110.000 cm/s AV VTI:            0.413 m AV Peak Grad:  10.0 mmHg AV Mean Grad:      6.0 mmHg LVOT Vmax:         73.40 cm/s LVOT Vmean:        46.200 cm/s LVOT VTI:          0.186 m LVOT/AV VTI ratio: 0.45  AORTA Ao Root diam: 3.20 cm MITRAL VALVE                TRICUSPID VALVE MV Area (PHT): 2.82 cm     TR Peak grad:   13.4 mmHg MV Decel Time: 269 msec     TR Vmax:        183.00 cm/s MV E velocity: 85.90 cm/s MV A velocity: 104.00 cm/s  SHUNTS MV E/A ratio:  0.83         Systemic VTI:  0.19 m                             Systemic Diam: 2.20 cm Vishnu Priya Mallipeddi Electronically signed by Lorelee Cover Mallipeddi Signature Date/Time: 01/13/2022/10:47:36 AM    Final    DG Chest Portable 1 View  Result Date: 01/12/2022 CLINICAL DATA:  Chest pain and shortness of breath EXAM: PORTABLE CHEST 1 VIEW COMPARISON:  Chest radiograph dated 10/29/2021 FINDINGS: Lines/tubes: Left chest wall ICD leads project over the right atrium and ventricle. Chest: Low lung volumes. Bibasilar patchy and linear opacities. Mild bilateral interstitial opacities. Pleura: No pneumothorax or pleural effusion. Heart/mediastinum: Similar enlarged cardiomediastinal silhouette. Bones: Median sternotomy wires are nondisplaced. IMPRESSION: 1. Low lung volumes with bibasilar atelectasis. 2. Mild bilateral interstitial opacities which may represent pulmonary edema. Electronically Signed   By: Darrin Nipper M.D.   On: 01/12/2022 13:59    Cardiac Studies   Echo  pending   Assessment & Plan    Ms Scovell is a 72yo woman with chronci systolic heart failure admitted after ICD shock. She has been started on amiodarone.   #VF #ICD shock Doing well on amiodarone.  Transition to oral amiodarone today. Keep K>4, Mg>2  #Chronic systolic heart failure #ICM #CAD s/p CABG EF 50% in 2021, now 25-30%.  NYHA III Continue coreg, farxiga, lasix, imdur Redose IV lasix today. Plan [facility] tomorrow to reassess coronary anatomy given newly reduced EF.  #DVT On eliquis as outpatient. Will stop this and transition to heparin gtt in anticipation of LHC.  Keep NPO after MN.  For questions or updates, please contact [place] Please consult [email or web address] for contact info under        Signed, Vickie Epley, MD  01/14/2022, 9:57 AM

## 2022-01-14 NOTE — Progress Notes (Addendum)
Pt removed 5 gold colored rings, 1 lifeline necklace, 1 copper colored bracelet and 4 multicolored rubber bracelets. They were places in a specimen container and biohazard bag, labeled with her name and placed in pt closet of room 3E20. She declined to have items sent to security for her upcoming procedure. Of note, she has 1 gold colored ring remaining on her left pinky finger that she is unable to remove.

## 2022-01-14 NOTE — Plan of Care (Signed)
  Problem: Education: Goal: Ability to describe self-care measures that may prevent or decrease complications (Diabetes Survival Skills Education) will improve Outcome: Progressing Goal: Individualized Educational Video(s) Outcome: Progressing   Problem: Coping: Goal: Ability to adjust to condition or change in health will improve Outcome: Progressing   Problem: Fluid Volume: Goal: Ability to maintain a balanced intake and output will improve Outcome: Progressing   Problem: Health Behavior/Discharge Planning: Goal: Ability to identify and utilize available resources and services will improve Outcome: Progressing Goal: Ability to manage health-related needs will improve Outcome: Progressing   Problem: Metabolic: Goal: Ability to maintain appropriate glucose levels will improve Outcome: Progressing   Problem: Nutritional: Goal: Maintenance of adequate nutrition will improve Outcome: Progressing Goal: Progress toward achieving an optimal weight will improve Outcome: Progressing   Problem: Skin Integrity: Goal: Risk for impaired skin integrity will decrease Outcome: Progressing   Problem: Tissue Perfusion: Goal: Adequacy of tissue perfusion will improve Outcome: Progressing   Problem: Education: Goal: Knowledge of General Education information will improve Description: Including pain rating scale, medication(s)/side effects and non-pharmacologic comfort measures Outcome: Progressing   Problem: Health Behavior/Discharge Planning: Goal: Ability to manage health-related needs will improve Outcome: Progressing   Problem: Health Behavior/Discharge Planning: Goal: Ability to manage health-related needs will improve Outcome: Progressing   Problem: Clinical Measurements: Goal: Ability to maintain clinical measurements within normal limits will improve Outcome: Progressing Goal: Will remain free from infection Outcome: Progressing Goal: Diagnostic test results will  improve Outcome: Progressing Goal: Respiratory complications will improve Outcome: Progressing Goal: Cardiovascular complication will be avoided Outcome: Progressing   Problem: Activity: Goal: Risk for activity intolerance will decrease Outcome: Progressing   Problem: Nutrition: Goal: Adequate nutrition will be maintained Outcome: Progressing   Problem: Coping: Goal: Level of anxiety will decrease Outcome: Progressing   Problem: Elimination: Goal: Will not experience complications related to bowel motility Outcome: Progressing Goal: Will not experience complications related to urinary retention Outcome: Progressing   Problem: Pain Managment: Goal: General experience of comfort will improve Outcome: Progressing   Problem: Safety: Goal: Ability to remain free from injury will improve Outcome: Progressing   Problem: Skin Integrity: Goal: Risk for impaired skin integrity will decrease Outcome: Progressing   Problem: Education: Goal: Understanding of CV disease, CV risk reduction, and recovery process will improve Outcome: Progressing Goal: Individualized Educational Video(s) Outcome: Progressing   Problem: Activity: Goal: Ability to return to baseline activity level will improve Outcome: Progressing   Problem: Cardiovascular: Goal: Ability to achieve and maintain adequate cardiovascular perfusion will improve Outcome: Progressing Goal: Vascular access site(s) Level 0-1 will be maintained Outcome: Progressing   Problem: Health Behavior/Discharge Planning: Goal: Ability to safely manage health-related needs after discharge will improve Outcome: Progressing

## 2022-01-14 NOTE — Progress Notes (Signed)
ANTICOAGULATION CONSULT NOTE - Initial Consult  Pharmacy Consult for heparin Indication:  Hx of DVT   Allergies  Allergen Reactions   Metformin And Related Itching, Swelling and Other (See Comments)    Leg pain & swelling in legs   Ozempic (0.25 Or 0.5 Mg-Dose) [Semaglutide(0.25 Or 0.'5mg'$ -Dos)] Other (See Comments)    Pt with hx of pancreatitis    Rosuvastatin    Atorvastatin Calcium Itching and Rash   Levofloxacin Itching    Patient Measurements: Height: 5' (152.4 cm) Weight: 105.3 kg (232 lb 2.3 oz) IBW/kg (Calculated) : 45.5 Heparin Dosing Weight: 71.4 kg  Vital Signs: Temp: 98 F (36.7 C) (01/07 0806) Temp Source: Oral (01/07 0806) BP: 144/62 (01/07 0806) Pulse Rate: 57 (01/07 0806)  Labs: Recent Labs    01/12/22 1036 01/12/22 1406 01/12/22 1407 01/12/22 1750 01/12/22 2101 01/13/22 0050 01/14/22 0052  HGB 14.0 14.6  --   --   --   --   --   HCT 44.6 43.0  --   --   --   --   --   PLT 137*  --   --   --   --   --   --   CREATININE 0.90 0.80  --   --  1.13* 1.09* 1.09*  TROPONINIHS  --   --  30* 31*  --   --   --     Estimated Creatinine Clearance: 51.9 mL/min (A) (by C-G formula based on SCr of 1.09 mg/dL (H)).   Medical History: Past Medical History:  Diagnosis Date   Abnormal LFTs    AICD (automatic cardioverter/defibrillator) present    Angina decubitus 11/06/2012   Angina pectoris ([place])    Anxiety    Arthritis    Back pain    Bundle branch block 05/2016   CAD (coronary artery disease) 06/18/2010   CHF (congestive heart failure) (HCC)    Chronic combined systolic and diastolic CHF, NYHA class 3 (HCC)    Chronic combined systolic and diastolic heart failure (HCC)    Chronic systolic dysfunction of left ventricle 09/24/2012   Coronary artery disease    s/p CABG 2007   Depression    Dermal mycosis    Diabetes mellitus    type II   DKA (diabetic ketoacidoses) 01/12/2019   DVT (deep venous thrombosis) ([place]) 09/2015   bilateral   Edema, lower  extremity    Glaucoma    Hyperlipidemia    Hypertension    Hypoglycemia 09/09/2012   Insomnia    Insulin dependent diabetes mellitus ([place]) 04/23/2012   Ischemic cardiomyopathy    EF 30-35%, s/p ICD 4/08 by Dr Leonia Reeves   Joint pain    LBBB (left bundle branch block) 09/24/2012   Morbid obesity ([place]) 11/06/2012   Obesity    Oxygen dependent 2 L   Pain in left knee    Peripheral neuropathy    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   LIMA to D2, SVG to ramus intermediate, sequential SVG to OM1-OM2, SVG to RCA with EVH via both legs    Sleep apnea    uses O2 at night   Tinea    Type 2 diabetes mellitus with hyperglycemia, with long-term current use of insulin ([place]) 04/20/2019   Uncontrolled diabetes mellitus 02/2019   Ventricular tachycardia ([place]) 01/16/15   sustained VT terminated with ATP, CL 340 msec   Vitamin B 12 deficiency 11/05/2018   Vitamin D deficiency 11/05/2018    Medications:  Medications Prior to Admission  Medication Sig Dispense Refill Last Dose   albuterol (PROVENTIL) (2.5 MG/3ML) 0.083% nebulizer solution Take 2.5 mg by nebulization every 6 (six) hours as needed for wheezing or shortness of breath.    Past Week   carvedilol (COREG) 6.25 MG tablet Take 1 tablet (6.25 mg total) by mouth 2 (two) times daily with a meal. 60 tablet 0 01/12/2022 at 0800   cyanocobalamin (,VITAMIN B-12,) 1000 MCG/ML injection 1,000 mcg every 30 (thirty) days.   12/19/2021   dapagliflozin propanediol (FARXIGA) 10 MG TABS tablet Take 1 tablet (10 mg total) by mouth daily. 90 tablet 3 01/11/2022   ELIQUIS 5 MG TABS tablet TAKE 1 TABLET TWICE DAILY 180 tablet 3 01/11/2022 at 2200   furosemide (LASIX) 40 MG tablet TAKE 1 TABLET EVERY DAY (Patient taking differently: Take 40 mg by mouth daily.) 90 tablet 3 01/11/2022   gabapentin (NEURONTIN) 300 MG capsule Take 300 mg by mouth at bedtime.   01/11/2022   insulin degludec (TRESIBA FLEXTOUCH) 100 UNIT/ML FlexTouch Pen Inject 30 Units into the skin daily. 30 mL 3 01/11/2022    isosorbide mononitrate (IMDUR) 30 MG 24 hr tablet Take 30 mg by mouth daily.   01/11/2022   latanoprost (XALATAN) 0.005 % ophthalmic solution Place 1 drop into both eyes at bedtime.   01/11/2022   lisinopril (ZESTRIL) 5 MG tablet Take 5 mg by mouth daily.   01/11/2022   nitroGLYCERIN (NITROSTAT) 0.4 MG SL tablet Place 0.4 mg under the tongue every 5 (five) minutes as needed for chest pain.   unknown   NOVOLOG FLEXPEN 100 UNIT/ML FlexPen INJECT PER SLIDING SCALE AS DIRECTED , MAX 30 UNITS PER DAY. DISCARD PEN 28 DAYS AFTER OPENING (Patient taking differently: Inject 10-15 Units into the skin 3 (three) times daily with meals. Per sliding scale) 30 mL 3 01/11/2022   potassium chloride SA (KLOR-CON) 20 MEQ tablet Take 20 mEq by mouth daily as needed (for low pottassium).   Past Week   rosuvastatin (CRESTOR) 20 MG tablet Take 1 tablet (20 mg total) by mouth daily at 6 PM. (Patient taking differently: Take 20 mg by mouth every evening.) 30 tablet 2 01/11/2022   Vitamin D, Ergocalciferol, (DRISDOL) 1.25 MG (50000 UT) CAPS capsule Take 1 capsule (50,000 Units total) by mouth every 7 (seven) days. 4 capsule 0 01/11/2022   Alcohol Swabs (ALCOHOL WIPES) 70 % PADS DropSafe Alcohol Prep Pads  USE WHEN CHECKING BLOOD SUGARS      Continuous Blood Gluc Receiver (DEXCOM G7 RECEIVER) DEVI 1 Device by Does not apply route as directed. 1 each 0    Continuous Blood Gluc Sensor (DEXCOM G7 SENSOR) MISC 1 Device by Does not apply route as directed. 9 each 3    ferrous sulfate 325 (65 FE) MG EC tablet Take 325 mg by mouth every other day.      Insulin Pen Needle 31G X 5 MM MISC 1 Device by Does not apply route in the morning, at noon, in the evening, and at bedtime. 400 each 3    TRUE METRIX BLOOD GLUCOSE TEST test strip TEST THREE TIMES DAILY 300 strip 3    Scheduled:   amiodarone  400 mg Oral Daily   carvedilol  12.5 mg Oral BID WC   dapagliflozin propanediol  10 mg Oral Daily   insulin aspart  0-15 Units Subcutaneous TID WC    insulin aspart  0-5 Units Subcutaneous QHS   isosorbide mononitrate  30 mg Oral Daily   rosuvastatin  20 mg  Oral q1800   sodium chloride flush  3 mL Intravenous Q12H    Assessment: 72 YO female presenting 1/5 for evaluation of ICD shock. Past medical history of CAD s/p CABG 2007, ICM, chronic systolic CHF, DM2, h/o DVT, HTN, HLD, LBBB, obesity, OSA, and VT. Patient is on Eliquis prior to admission for history of DVT (09/2015) with last dose 1/7 @ 0824. Pharmacy consulted for heparin in anticipation of LHC Monday, 1/8.   1/5 CBC showing Hgb 14.6, plt 137--stable. No signs/symptoms of bleeding reported. Will start heparin gtt at time of next Eliquis dose.  Goal of Therapy:  Heparin level 0.3-0.7 units/ml aPTT 66-102 seconds Monitor platelets by anticoagulation protocol: Yes   Plan:  Start heparin infusion @ 1200 units/hr on 1/7 at 2100 F/u AM aPTT/heparin level Monitor aPTT, heparin level, CBC, s/sx of bleeding daily F/u anticoag plan post-LHC on Monday, 1/8    Billey Gosling, PharmD PGY1 Pharmacy Resident 1/7/202410:23 AM

## 2022-01-15 ENCOUNTER — Encounter (HOSPITAL_COMMUNITY)
Admission: EM | Disposition: A | Discharge status: Home / Self Care | Payer: Self-pay | Source: Home / Self Care | Attending: Cardiology

## 2022-01-15 ENCOUNTER — Encounter (HOSPITAL_COMMUNITY): Payer: Self-pay | Admitting: Cardiovascular Disease

## 2022-01-15 DIAGNOSIS — I2510 Atherosclerotic heart disease of native coronary artery without angina pectoris: Secondary | ICD-10-CM | POA: Diagnosis not present

## 2022-01-15 DIAGNOSIS — I472 Ventricular tachycardia, unspecified: Secondary | ICD-10-CM | POA: Diagnosis not present

## 2022-01-15 HISTORY — PX: LEFT HEART CATH AND CORS/GRAFTS ANGIOGRAPHY: CATH118250

## 2022-01-15 LAB — GLUCOSE, CAPILLARY
Glucose-Capillary: 156 mg/dL — ABNORMAL HIGH (ref 70–99)
Glucose-Capillary: 208 mg/dL — ABNORMAL HIGH (ref 70–99)
Glucose-Capillary: 163 mg/dL — ABNORMAL HIGH (ref 70–99)
Glucose-Capillary: 168 mg/dL — ABNORMAL HIGH (ref 70–99)
Glucose-Capillary: 211 mg/dL — ABNORMAL HIGH (ref 70–99)
Glucose-Capillary: 237 mg/dL — ABNORMAL HIGH (ref 70–99)

## 2022-01-15 LAB — APTT
aPTT: 56 seconds — ABNORMAL HIGH (ref 24–36)
aPTT: 84 seconds — ABNORMAL HIGH (ref 24–36)

## 2022-01-15 LAB — BASIC METABOLIC PANEL
Anion gap: 8 (ref 5–15)
BUN: 20 mg/dL (ref 8–23)
CO2: 26 mmol/L (ref 22–32)
Calcium: 9.8 mg/dL (ref 8.9–10.3)
Chloride: 102 mmol/L (ref 98–111)
Creatinine, Ser: 1.04 mg/dL — ABNORMAL HIGH (ref 0.44–1.00)
GFR, Estimated: 57 mL/min — ABNORMAL LOW (ref >60)
Glucose, Bld: 184 mg/dL — ABNORMAL HIGH (ref 70–99)
Potassium: 4.3 mmol/L (ref 3.5–5.1)
Sodium: 136 mmol/L (ref 135–145)

## 2022-01-15 LAB — CBC
HCT: 39.4 % (ref 36.0–46.0)
Hemoglobin: 12.5 g/dL (ref 12.0–15.0)
MCH: 29.4 pg (ref 26.0–34.0)
MCHC: 31.7 g/dL (ref 30.0–36.0)
MCV: 92.7 fL (ref 80.0–100.0)
Platelets: 133 10*3/uL — ABNORMAL LOW (ref 150–400)
RBC: 4.25 MIL/uL (ref 3.87–5.11)
RDW: 16 % — ABNORMAL HIGH (ref 11.5–15.5)
WBC: 6.1 10*3/uL (ref 4.0–10.5)
nRBC: 0 % (ref 0.0–0.2)

## 2022-01-15 LAB — HEPARIN LEVEL (UNFRACTIONATED): Heparin Unfractionated: >1.1 IU/mL — ABNORMAL HIGH (ref 0.30–0.70)

## 2022-01-15 LAB — MAGNESIUM: Magnesium: 2.1 mg/dL (ref 1.7–2.4)

## 2022-01-15 SURGERY — LEFT HEART CATH AND CORS/GRAFTS ANGIOGRAPHY
Anesthesia: LOCAL

## 2022-01-15 MED ORDER — MIDAZOLAM HCL 2 MG/2ML IJ SOLN
INTRAMUSCULAR | Status: DC | PRN
  Administered 2022-01-15: 1 mg via INTRAVENOUS

## 2022-01-15 MED ORDER — MORPHINE SULFATE (PF) 2 MG/ML IV SOLN: 2.0000 mg | INTRAVENOUS | Status: DC | PRN

## 2022-01-15 MED ORDER — IOHEXOL 350 MG/ML SOLN
INTRAVENOUS | Status: DC | PRN
  Administered 2022-01-15: 110 mL

## 2022-01-15 MED ORDER — HEPARIN (PORCINE) IN NACL 1000-0.9 UT/500ML-% IV SOLN
INTRAVENOUS | Status: DC | PRN
  Administered 2022-01-15 (×2): 500 mL

## 2022-01-15 MED ORDER — LIDOCAINE HCL (PF) 1 % IJ SOLN
INTRAMUSCULAR | Status: AC
  Filled 2022-01-15: qty 30

## 2022-01-15 MED ORDER — LABETALOL HCL 5 MG/ML IV SOLN: 10.0000 mg | INTRAVENOUS | Status: AC | PRN

## 2022-01-15 MED ORDER — LIDOCAINE HCL (PF) 1 % IJ SOLN
INTRAMUSCULAR | Status: DC | PRN
  Administered 2022-01-15: 15 mL

## 2022-01-15 MED ORDER — FERROUS SULFATE 325 (65 FE) MG PO TABS
325.0000 mg | ORAL_TABLET | ORAL | Status: DC
  Administered 2022-01-15 – 2022-01-17 (×2): 325 mg via ORAL
  Filled 2022-01-15 (×3): qty 1

## 2022-01-15 MED ORDER — BACLOFEN 5 MG HALF TABLET: 5.0000 mg | ORAL_TABLET | Freq: Every day | ORAL | Status: DC | PRN

## 2022-01-15 MED ORDER — HEPARIN (PORCINE) 25000 UT/250ML-% IV SOLN
1200.0000 [IU]/h | INTRAVENOUS | Status: DC
  Administered 2022-01-15: 1200 [IU]/h via INTRAVENOUS
  Filled 2022-01-15: qty 250

## 2022-01-15 MED ORDER — FENTANYL CITRATE (PF) 100 MCG/2ML IJ SOLN
INTRAMUSCULAR | Status: AC
  Filled 2022-01-15: qty 2

## 2022-01-15 MED ORDER — INSULIN GLARGINE 100 UNIT/ML ~~LOC~~ SOLN
20.0000 [IU] | Freq: Every day | SUBCUTANEOUS | Status: DC
  Administered 2022-01-15 – 2022-01-16 (×2): 20 [IU] via SUBCUTANEOUS
  Filled 2022-01-15 (×3): qty 0.2

## 2022-01-15 MED ORDER — HYDRALAZINE HCL 20 MG/ML IJ SOLN: 10.0000 mg | INTRAMUSCULAR | Status: AC | PRN

## 2022-01-15 MED ORDER — GABAPENTIN 300 MG PO CAPS
300.0000 mg | ORAL_CAPSULE | Freq: Every day | ORAL | Status: DC
  Administered 2022-01-15 – 2022-01-16 (×2): 300 mg via ORAL
  Filled 2022-01-15 (×2): qty 1

## 2022-01-15 MED ORDER — MIDAZOLAM HCL 2 MG/2ML IJ SOLN
INTRAMUSCULAR | Status: AC
  Filled 2022-01-15: qty 2

## 2022-01-15 MED ORDER — SODIUM CHLORIDE 0.9% FLUSH
3.0000 mL | Freq: Two times a day (BID) | INTRAVENOUS | Status: DC
  Administered 2022-01-15 – 2022-01-17 (×5): 3 mL via INTRAVENOUS

## 2022-01-15 MED ORDER — ACETAMINOPHEN 325 MG PO TABS
650.0000 mg | ORAL_TABLET | ORAL | Status: DC | PRN
  Administered 2022-01-15 – 2022-01-16 (×2): 650 mg via ORAL
  Filled 2022-01-15 (×3): qty 2

## 2022-01-15 MED ORDER — SODIUM CHLORIDE 0.9% FLUSH: 3.0000 mL | INTRAVENOUS | Status: DC | PRN

## 2022-01-15 MED ORDER — SODIUM CHLORIDE 0.9 % IV SOLN: 250.0000 mL | INTRAVENOUS | Status: DC | PRN

## 2022-01-15 MED ORDER — SODIUM CHLORIDE 0.9 % IV SOLN: INTRAVENOUS | Status: AC

## 2022-01-15 MED ORDER — FENTANYL CITRATE (PF) 100 MCG/2ML IJ SOLN
INTRAMUSCULAR | Status: DC | PRN
  Administered 2022-01-15: 25 ug via INTRAVENOUS

## 2022-01-15 MED ORDER — ALBUTEROL SULFATE (2.5 MG/3ML) 0.083% IN NEBU: 2.5000 mg | INHALATION_SOLUTION | Freq: Four times a day (QID) | RESPIRATORY_TRACT | Status: DC | PRN

## 2022-01-15 MED ORDER — ASPIRIN 81 MG PO CHEW
81.0000 mg | CHEWABLE_TABLET | Freq: Every day | ORAL | Status: DC
  Administered 2022-01-16 – 2022-01-17 (×2): 81 mg via ORAL
  Filled 2022-01-15 (×2): qty 1

## 2022-01-15 SURGICAL SUPPLY — 9 items
CATH INFINITI 5 FR RCB (CATHETERS) IMPLANT
CATH INFINITI 5FR MULTPACK ANG (CATHETERS) IMPLANT
CLOSURE MYNX CONTROL 5F (Vascular Products) IMPLANT
KIT HEART LEFT (KITS) ×1 IMPLANT
PACK CARDIAC CATHETERIZATION (CUSTOM PROCEDURE TRAY) ×1 IMPLANT
SHEATH PINNACLE 5F 10CM (SHEATH) IMPLANT
TRANSDUCER W/STOPCOCK (MISCELLANEOUS) ×1 IMPLANT
TUBING CIL FLEX 10 FLL-RA (TUBING) ×1 IMPLANT
WIRE EMERALD 3MM-J .035X150CM (WIRE) IMPLANT

## 2022-01-15 NOTE — Progress Notes (Signed)
Rounding Note    Patient Name: [name] Date of Encounter: 01/15/2022   [facility] Cardiologist: Candee Furbish, MD  EP: Dr. Quentin Ore  Subjective   Denies any complaints or concerns  Inpatient Medications    Scheduled Meds:  amiodarone  400 mg Oral Daily   carvedilol  12.5 mg Oral BID WC   dapagliflozin propanediol  10 mg Oral Daily   ferrous sulfate  325 mg Oral QODAY   gabapentin  300 mg Oral QHS   insulin aspart  0-15 Units Subcutaneous TID WC   insulin aspart  0-5 Units Subcutaneous QHS   isosorbide mononitrate  30 mg Oral Daily   rosuvastatin  20 mg Oral q1800   sodium chloride flush  3 mL Intravenous Q12H   sodium chloride flush  3 mL Intravenous Q12H   Continuous Infusions:  sodium chloride     sodium chloride     sodium chloride 10 mL/hr at 01/15/22 0600   heparin 1,200 Units/hr (01/15/22 0600)   PRN Meds: sodium chloride, sodium chloride, acetaminophen, albuterol, baclofen, latanoprost, nitroGLYCERIN, sodium chloride flush, sodium chloride flush   Vital Signs    Vitals:   01/15/22 0012 01/15/22 0425 01/15/22 0600 01/15/22 0736  BP: (!) 156/67 (!) 158/67  (!) 167/71  Pulse:    60  Resp: 20 16  (!) 22  Temp:  98.4 F (36.9 C)  98.4 F (36.9 C)  TempSrc:  Oral  Oral  SpO2:  96%  94%  Weight:   119 kg   Height:        Intake/Output Summary (Last 24 hours) at 01/15/2022 0919 Last data filed at 01/15/2022 0600 Gross per 24 hour  Intake 592.58 ml  Output 2300 ml  Net -1707.42 ml      01/15/2022    6:00 AM 01/14/2022   10:00 AM 01/13/2022    6:08 AM  Last 3 Weights  Weight (lbs) 262 lb 5.6 oz 232 lb 2.3 oz 232 lb 2.3 oz  Weight (kg) 119 kg 105.3 kg 105.3 kg      Telemetry    SB, 50's - Personally Reviewed  ECG    No new EKGs - Personally Reviewed  Physical Exam   GEN: No acute distress.   Neck: No JVD Cardiac: RRR, no murmurs, rubs, or gallops.  Respiratory: CTA b/l. GI: Soft, nontender, non-distended  MS: No edema; No  deformity. Neuro:  Nonfocal  Psych: Normal affect   Labs    High Sensitivity Troponin:   Recent Labs  Lab 01/12/22 1407 01/12/22 1750  TROPONINIHS 30* 31*     Chemistry Recent Labs  Lab 01/13/22 0050 01/14/22 0052 01/15/22 0034  NA 137 135 136  K 3.4* 3.8 4.3  CL 102 99 102  CO2 '26 28 26  '$ GLUCOSE 194* 136* 184*  BUN '21 22 20  '$ CREATININE 1.09* 1.09* 1.04*  CALCIUM 9.3 9.3 9.8  MG 2.0 2.0 2.1  GFRNONAA 54* 54* 57*  ANIONGAP '9 8 8    '$ Lipids No results for input(s): "CHOL", "TRIG", "HDL", "LABVLDL", "LDLCALC", "CHOLHDL" in the last 168 hours.  Hematology Recent Labs  Lab 01/12/22 1036 01/12/22 1406 01/15/22 0034  WBC 6.5  --  6.1  RBC 4.68  --  4.25  HGB 14.0 14.6 12.5  HCT 44.6 43.0 39.4  MCV 95.3  --  92.7  MCH 29.9  --  29.4  MCHC 31.4  --  31.7  RDW 16.0*  --  16.0*  PLT 137*  --  133*   Thyroid No results for input(s): "TSH", "FREET4" in the last 168 hours.  BNPNo results for input(s): "BNP", "PROBNP" in the last 168 hours.  DDimer No results for input(s): "DDIMER" in the last 168 hours.   Radiology    No results found.  Cardiac Studies   01/13/22: TTE 1. Left ventricular ejection fraction, by estimation, is 25 to 30%. The  left ventricle has severely decreased function. The left ventricle  demonstrates global hypokinesis. There is moderate left ventricular  hypertrophy. Left ventricular diastolic  parameters are consistent with Grade I diastolic dysfunction (impaired  relaxation).   2. Right ventricular systolic function is mildly reduced. The right  ventricular size is mildly enlarged. There is normal pulmonary artery  systolic pressure. The estimated right ventricular systolic pressure is  34.1 mmHg.   3. The mitral valve is grossly normal. No evidence of mitral valve  regurgitation. No evidence of mitral stenosis.   4. The aortic valve was not well visualized. Aortic valve regurgitation  is not visualized. Aortic valve sclerosis/calcification  is present,  without any evidence of aortic stenosis.   5. The inferior vena cava is normal in size with greater than 50%  respiratory variability, suggesting right atrial pressure of 3 mmHg.   Comparison(s): Changes from prior study are noted. LVEF worsened from 50%  in 2021 to 25% now.   01/19/2019: LHC Severe diffuse three-vessel coronary disease with functional total occlusion of the mid LAD, total occlusion of the large first diagonal, total occlusion of the proximal ramus intermedius, total occlusion of the proximal circumflex, and total occlusion of the mid RCA.  All vessels are heavily calcified. Patent saphenous vein graft to the PDA.  Moderate diffuse disease beyond the graft insertion site Patent sequential saphenous vein graft to the first and second obtuse marginal.  Diffuse native vessel disease beyond the bypass graft insertion site. Patent saphenous vein graft to the ramus intermedius.  Diffuse native vessel disease beyond the graft insertion site. Patent left internal mammary graft to the dominant diagonal.  Diffuse native vessel disease beyond the graft insertion site. Chronic systolic heart failure with EF less than 35% and LVEDP 22 mmHg.     01/13/2019: TTE IMPRESSIONS   1. Suboptimal image quality for diagnosis of biventricular function and  wall motion.   2. Left ventricular ejection fraction, by visual estimation, is 50%. The  left ventricle has limited views for evaluation of function. Possible  apical wall motion abnormality. There is mildly increased left ventricular  hypertrophy.   3. Global right ventricle was not well visualized.The right ventricular  size is not well visualized. Right vetricular wall thickness was not  assessed.   4. Left atrial size was not well visualized.   5. Right atrial size was not well visualized.   6. Mild mitral annular calcification.   7. The mitral valve is abnormal. No evidence of mitral valve  regurgitation. No evidence of  mitral stenosis.   8. The tricuspid valve is not well visualized.   9. The aortic valve was not well visualized. Aortic valve regurgitation  is not visualized. No evidence of aortic valve sclerosis or stenosis.   Patient Profile     72 y.o. female w/PMHx of CAD (CABG 2007), ICM, chronic CHF (systlic), DM (w/hx of DKA), DVT, HTN, HLD, LBBB, obesity, OSA (w/O2 at [facility]), VT, ICD   Admitted for appropriate ICD chock for VT   Device information [employer] ICD implanted 05/01/2006 >> attempts at CRT upgrade and gen  change 09/25/2012  (No suitable veins for LV lead) CRT device, though LV port is plugged + appropriate therapy  01/18/2019, VF Jan 2024, VT  Assessment & Plan    1.  VT/VF H/o appropriate therapy in 01/18/2019 as well.  Increased coreg to 12.5 mg BID Hyperkalemic on admission (?spurious) K+ 4.3 today Mag 2.1 Amiodarone gtt > PO yesterday Home coreg was increased here    2. Chronic systolic CHF  EF ~54% 0/0867 Now 25-30% Volume status appears stable Trace 1+ edema LE, lungs are clear Creat stable  3. ICM No s/s ischemia Cath in 2021 with patent grafts Planned for LHC given new reduction in LVEF Had dose of Eliquis yesterday AM, pending cath today, suspect will be afternoon    4. LBBB No suitable veins for upgrade with previous attempt.  Minimal RV pacing.   5. Obesity Body mass index is 45.31 kg/m.  Have encouraged weight loss.    6. H/o DVT On chronic eliquis > heparin gtt here   7. DM2 SSI ordered  For questions or updates, please contact [place] Please consult [email or web address] for contact info under        Signed, Baldwin Jamaica, PA-C  01/15/2022, 9:19 AM

## 2022-01-15 NOTE — Discharge Instructions (Signed)
NO DRIVING 6 MONTHS    Groin Site Care Refer to this sheet in the next few weeks. These instructions provide you with information on caring for yourself after your procedure. Your caregiver may also give you more specific instructions. Your treatment has been planned according to current medical practices, but problems sometimes occur. Call your caregiver if you have any problems or questions after your procedure. HOME CARE INSTRUCTIONS You may shower 24 hours after the procedure. Remove the bandage (dressing) and gently wash the site with plain soap and water. Gently pat the site dry.  Do not apply powder or lotion to the site.  Do not sit in a bathtub, swimming pool, or whirlpool for 5 to 7 days.  No bending, squatting, or lifting anything over 10 pounds (4.5 kg) as directed by your caregiver.  Inspect the site at least twice daily.  Do not drive home if you are discharged the same day of the procedure. Have someone else drive you.  You may drive 24 hours after the procedure unless otherwise instructed by your caregiver.  What to expect: Any bruising will usually fade within 1 to 2 weeks.  Blood that collects in the tissue (hematoma) may be painful to the touch. It should usually decrease in size and tenderness within 1 to 2 weeks.  SEEK IMMEDIATE MEDICAL CARE IF: You have unusual pain at the groin site or down the affected leg.  You have redness, warmth, swelling, or pain at the groin site.  You have drainage (other than a small amount of blood on the dressing).  You have chills.  You have a fever or persistent symptoms for more than 72 hours.  You have a fever and your symptoms suddenly get worse.  Your leg becomes pale, cool, tingly, or numb.  You have heavy bleeding from the site. Hold pressure on the site. Marland Kitchen

## 2022-01-15 NOTE — Interval H&P Note (Signed)
Cath Lab Visit (complete for each Cath Lab visit)  Clinical Evaluation Leading to the Procedure:   ACS: No.  Non-ACS:    Anginal Classification: No Symptoms  Anti-ischemic medical therapy: Minimal Therapy (1 class of medications)  Non-Invasive Test Results: No non-invasive testing performed  Prior CABG: Previous CABG      History and Physical Interval Note:  01/15/2022 12:49 PM  [name]  has presented today for surgery, with the diagnosis of unstable angina.  The various methods of treatment have been discussed with the patient and family. After consideration of risks, benefits and other options for treatment, the patient has consented to  Procedure(s): LEFT HEART CATH AND CORS/GRAFTS ANGIOGRAPHY (N/A) as a surgical intervention.  The patient's history has been reviewed, patient examined, no change in status, stable for surgery.  I have reviewed the patient's chart and labs.  Questions were answered to the patient's satisfaction.     Quay Burow

## 2022-01-15 NOTE — Progress Notes (Addendum)
ANTICOAGULATION CONSULT NOTE - Initial Consult  Pharmacy Consult for heparin Indication:  Hx of DVT   Allergies  Allergen Reactions   Metformin And Related Itching, Swelling and Other (See Comments)    Leg pain & swelling in legs   Ozempic (0.25 Or 0.5 Mg-Dose) [Semaglutide(0.25 Or 0.'5mg'$ -Dos)] Other (See Comments)    Pt with hx of pancreatitis    Rosuvastatin    Atorvastatin Calcium Itching and Rash   Levofloxacin Itching    Patient Measurements: Height: 5' (152.4 cm) Weight: 119 kg (262 lb 5.6 oz) IBW/kg (Calculated) : 45.5 Heparin Dosing Weight: 71.4 kg  Vital Signs: Temp: 98.4 F (36.9 C) (01/08 0425) Temp Source: Oral (01/08 0425) BP: 158/67 (01/08 0425)  Labs: Recent Labs    01/12/22 1036 01/12/22 1406 01/12/22 1407 01/12/22 1750 01/12/22 2101 01/13/22 0050 01/14/22 0052 01/15/22 0034 01/15/22 0621  HGB 14.0 14.6  --   --   --   --   --  12.5  --   HCT 44.6 43.0  --   --   --   --   --  39.4  --   PLT 137*  --   --   --   --   --   --  133*  --   APTT  --   --   --   --   --   --   --  56* 84*  HEPARINUNFRC  --   --   --   --   --   --   --  >1.10*  --   CREATININE 0.90 0.80  --   --    < > 1.09* 1.09* 1.04*  --   TROPONINIHS  --   --  30* 31*  --   --   --   --   --    < > = values in this interval not displayed.     Estimated Creatinine Clearance: 58.7 mL/min (A) (by C-G formula based on SCr of 1.04 mg/dL (H)).   Medical History: Past Medical History:  Diagnosis Date   Abnormal LFTs    AICD (automatic cardioverter/defibrillator) present    Angina decubitus 11/06/2012   Angina pectoris ([place])    Anxiety    Arthritis    Back pain    Bundle branch block 05/2016   CAD (coronary artery disease) 06/18/2010   CHF (congestive heart failure) (HCC)    Chronic combined systolic and diastolic CHF, NYHA class 3 (HCC)    Chronic combined systolic and diastolic heart failure ([place])    Chronic systolic dysfunction of left ventricle 09/24/2012   Coronary artery  disease    s/p CABG 2007   Depression    Dermal mycosis    Diabetes mellitus    type II   DKA (diabetic ketoacidoses) 01/12/2019   DVT (deep venous thrombosis) ([place]) 09/2015   bilateral   Edema, lower extremity    Glaucoma    Hyperlipidemia    Hypertension    Hypoglycemia 09/09/2012   Insomnia    Insulin dependent diabetes mellitus ([place]) 04/23/2012   Ischemic cardiomyopathy    EF 30-35%, s/p ICD 4/08 by Dr Leonia Reeves   Joint pain    LBBB (left bundle branch block) 09/24/2012   Morbid obesity ([place]) 11/06/2012   Obesity    Oxygen dependent 2 L   Pain in left knee    Peripheral neuropathy    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   LIMA to D2, SVG to ramus intermediate,  sequential SVG to OM1-OM2, SVG to RCA with EVH via both legs    Sleep apnea    uses O2 at night   Tinea    Type 2 diabetes mellitus with hyperglycemia, with long-term current use of insulin ([place]) 04/20/2019   Uncontrolled diabetes mellitus 02/2019   Ventricular tachycardia ([place]) 01/16/15   sustained VT terminated with ATP, CL 340 msec   Vitamin B 12 deficiency 11/05/2018   Vitamin D deficiency 11/05/2018     Assessment: 72 YO female presenting 1/5 for evaluation of ICD shock. Past medical history of CAD s/p CABG 2007, ICM, chronic systolic CHF, DM2, h/o DVT, HTN, HLD, LBBB, obesity, OSA, and VT. Patient is on Eliquis prior to admission for history of DVT (BL 09/2015 and chronic LLE DVT since) with last dose 1/7 @ 0824. Pharmacy consulted for heparin in anticipation of [facility] Monday, 1/8.   Heparin level >1.1 is still affected by apixaban, aPTT is 84 and therapeutic on 1200 units/hr.   Goal of Therapy:  Heparin level 0.3-0.7 units/ml aPTT 66-102 seconds Monitor platelets by anticoagulation protocol: Yes   Plan:  Continue heparin infusion @ 1200 units/hr    F/u aPTT until correlates with heparin level  Monitor daily heparin level, CBC Monitor for signs/symptoms of bleeding  F/u cath   Benetta Spar, PharmD, BCPS, BCCP Clinical  Pharmacist  Please check AMION for all [place] phone numbers After 10:00 PM, call [place]

## 2022-01-15 NOTE — TOC Progression Note (Addendum)
Transition of Care [facility]) - Progression Note    Patient Details  Name: [name] MRN: [id number] Date of Birth: 11/16/1950  Transition of Care [facility]) CM/SW Contact  Zenon Mayo, RN Phone Number: 01/15/2022, 11:54 AM  Clinical Narrative:    Patient from home with son, she has a walker and a cane at home, she does not have a scale of bp cuff , she will need to purchase these,  she uses salt on her food.  Need to  try not to use salt.  She has an aide from 10 am to 1 pm Mon- Fri.  She is suppose to go to cath lab today. She has home oxygen 2 liters prn with [employer], on eliquis pta.  NCM spoke with daughter who was at the bedside, if patient will need [facility] services she state she does not have a preference.  NCM informed her will await to see what PT eval says.  Per Staff RN Alver Fisher , states she ambulated patient and she does not think she needs physical therapy, son states she is at baseline.          Expected Discharge Plan and Services                                               Social Determinants of Health (SDOH) Interventions SDOH Screenings   Food Insecurity: No Food Insecurity (01/12/2022)  Housing: Low Risk  (01/12/2022)  Transportation Needs: No Transportation Needs (01/12/2022)  Utilities: Not At Risk (01/12/2022)  Depression (PHQ2-9): Low Risk  (06/19/2021)  Tobacco Use: Low Risk  (01/12/2022)    Readmission Risk Interventions     No data to display

## 2022-01-15 NOTE — H&P (View-Only) (Signed)
Rounding Note    Patient Name: [name] Date of Encounter: 01/15/2022  [facility] Cardiologist: Candee Furbish, MD  EP: Dr. Quentin Ore  Subjective   Denies any complaints or concerns  Inpatient Medications    Scheduled Meds:  amiodarone  400 mg Oral Daily   carvedilol  12.5 mg Oral BID WC   dapagliflozin propanediol  10 mg Oral Daily   ferrous sulfate  325 mg Oral QODAY   gabapentin  300 mg Oral QHS   insulin aspart  0-15 Units Subcutaneous TID WC   insulin aspart  0-5 Units Subcutaneous QHS   isosorbide mononitrate  30 mg Oral Daily   rosuvastatin  20 mg Oral q1800   sodium chloride flush  3 mL Intravenous Q12H   sodium chloride flush  3 mL Intravenous Q12H   Continuous Infusions:  sodium chloride     sodium chloride     sodium chloride 10 mL/hr at 01/15/22 0600   heparin 1,200 Units/hr (01/15/22 0600)   PRN Meds: sodium chloride, sodium chloride, acetaminophen, albuterol, baclofen, latanoprost, nitroGLYCERIN, sodium chloride flush, sodium chloride flush   Vital Signs    Vitals:   01/15/22 0012 01/15/22 0425 01/15/22 0600 01/15/22 0736  BP: (!) 156/67 (!) 158/67  (!) 167/71  Pulse:    60  Resp: 20 16  (!) 22  Temp:  98.4 F (36.9 C)  98.4 F (36.9 C)  TempSrc:  Oral  Oral  SpO2:  96%  94%  Weight:   119 kg   Height:        Intake/Output Summary (Last 24 hours) at 01/15/2022 0919 Last data filed at 01/15/2022 0600 Gross per 24 hour  Intake 592.58 ml  Output 2300 ml  Net -1707.42 ml      01/15/2022    6:00 AM 01/14/2022   10:00 AM 01/13/2022    6:08 AM  Last 3 Weights  Weight (lbs) 262 lb 5.6 oz 232 lb 2.3 oz 232 lb 2.3 oz  Weight (kg) 119 kg 105.3 kg 105.3 kg      Telemetry    SB, 50's - Personally Reviewed  ECG    No new EKGs - Personally Reviewed  Physical Exam   GEN: No acute distress.   Neck: No JVD Cardiac: RRR, no murmurs, rubs, or gallops.  Respiratory: CTA b/l. GI: Soft, nontender, non-distended  MS: No edema; No  deformity. Neuro:  Nonfocal  Psych: Normal affect   Labs    High Sensitivity Troponin:   Recent Labs  Lab 01/12/22 1407 01/12/22 1750  TROPONINIHS 30* 31*     Chemistry Recent Labs  Lab 01/13/22 0050 01/14/22 0052 01/15/22 0034  NA 137 135 136  K 3.4* 3.8 4.3  CL 102 99 102  CO2 '26 28 26  '$ GLUCOSE 194* 136* 184*  BUN '21 22 20  '$ CREATININE 1.09* 1.09* 1.04*  CALCIUM 9.3 9.3 9.8  MG 2.0 2.0 2.1  GFRNONAA 54* 54* 57*  ANIONGAP '9 8 8    '$ Lipids No results for input(s): "CHOL", "TRIG", "HDL", "LABVLDL", "LDLCALC", "CHOLHDL" in the last 168 hours.  Hematology Recent Labs  Lab 01/12/22 1036 01/12/22 1406 01/15/22 0034  WBC 6.5  --  6.1  RBC 4.68  --  4.25  HGB 14.0 14.6 12.5  HCT 44.6 43.0 39.4  MCV 95.3  --  92.7  MCH 29.9  --  29.4  MCHC 31.4  --  31.7  RDW 16.0*  --  16.0*  PLT 137*  --  133*   Thyroid No results for input(s): "TSH", "FREET4" in the last 168 hours.  BNPNo results for input(s): "BNP", "PROBNP" in the last 168 hours.  DDimer No results for input(s): "DDIMER" in the last 168 hours.   Radiology    No results found.  Cardiac Studies   01/13/22: TTE 1. Left ventricular ejection fraction, by estimation, is 25 to 30%. The  left ventricle has severely decreased function. The left ventricle  demonstrates global hypokinesis. There is moderate left ventricular  hypertrophy. Left ventricular diastolic  parameters are consistent with Grade I diastolic dysfunction (impaired  relaxation).   2. Right ventricular systolic function is mildly reduced. The right  ventricular size is mildly enlarged. There is normal pulmonary artery  systolic pressure. The estimated right ventricular systolic pressure is  23.3 mmHg.   3. The mitral valve is grossly normal. No evidence of mitral valve  regurgitation. No evidence of mitral stenosis.   4. The aortic valve was not well visualized. Aortic valve regurgitation  is not visualized. Aortic valve sclerosis/calcification  is present,  without any evidence of aortic stenosis.   5. The inferior vena cava is normal in size with greater than 50%  respiratory variability, suggesting right atrial pressure of 3 mmHg.   Comparison(s): Changes from prior study are noted. LVEF worsened from 50%  in 2021 to 25% now.   01/19/2019: LHC Severe diffuse three-vessel coronary disease with functional total occlusion of the mid LAD, total occlusion of the large first diagonal, total occlusion of the proximal ramus intermedius, total occlusion of the proximal circumflex, and total occlusion of the mid RCA.  All vessels are heavily calcified. Patent saphenous vein graft to the PDA.  Moderate diffuse disease beyond the graft insertion site Patent sequential saphenous vein graft to the first and second obtuse marginal.  Diffuse native vessel disease beyond the bypass graft insertion site. Patent saphenous vein graft to the ramus intermedius.  Diffuse native vessel disease beyond the graft insertion site. Patent left internal mammary graft to the dominant diagonal.  Diffuse native vessel disease beyond the graft insertion site. Chronic systolic heart failure with EF less than 35% and LVEDP 22 mmHg.     01/13/2019: TTE IMPRESSIONS   1. Suboptimal image quality for diagnosis of biventricular function and  wall motion.   2. Left ventricular ejection fraction, by visual estimation, is 50%. The  left ventricle has limited views for evaluation of function. Possible  apical wall motion abnormality. There is mildly increased left ventricular  hypertrophy.   3. Global right ventricle was not well visualized.The right ventricular  size is not well visualized. Right vetricular wall thickness was not  assessed.   4. Left atrial size was not well visualized.   5. Right atrial size was not well visualized.   6. Mild mitral annular calcification.   7. The mitral valve is abnormal. No evidence of mitral valve  regurgitation. No evidence of  mitral stenosis.   8. The tricuspid valve is not well visualized.   9. The aortic valve was not well visualized. Aortic valve regurgitation  is not visualized. No evidence of aortic valve sclerosis or stenosis.   Patient Profile     72 y.o. female w/PMHx of CAD (CABG 2007), ICM, chronic CHF (systlic), DM (w/hx of DKA), DVT, HTN, HLD, LBBB, obesity, OSA (w/O2 at [facility]), VT, ICD   Admitted for appropriate ICD chock for VT   Device information [employer] ICD implanted 05/01/2006 >> attempts at CRT upgrade and gen  change 09/25/2012  (No suitable veins for LV lead) CRT device, though LV port is plugged + appropriate therapy  01/18/2019, VF Jan 2024, VT  Assessment & Plan    1.  VT/VF H/o appropriate therapy in 01/18/2019 as well.  Increased coreg to 12.5 mg BID Hyperkalemic on admission (?spurious) K+ 4.3 today Mag 2.1 Amiodarone gtt > PO yesterday Home coreg was increased here    2. Chronic systolic CHF  EF ~13% 02/4399 Now 25-30% Volume status appears stable Trace 1+ edema LE, lungs are clear Creat stable  3. ICM No s/s ischemia Cath in 2021 with patent grafts Planned for LHC given new reduction in LVEF Had dose of Eliquis yesterday AM, pending cath today, suspect will be afternoon    4. LBBB No suitable veins for upgrade with previous attempt.  Minimal RV pacing.   5. Obesity Body mass index is 45.31 kg/m.  Have encouraged weight loss.    6. H/o DVT On chronic eliquis > heparin gtt here   7. DM2 SSI ordered  For questions or updates, please contact [place] Please consult [email or web address] for contact info under        Signed, Baldwin Jamaica, PA-C  01/15/2022, 9:19 AM

## 2022-01-15 NOTE — Progress Notes (Signed)
   Heart Failure Stewardship Pharmacist Progress Note   PCP: Everardo Beals, NP PCP-Cardiologist: Candee Furbish, MD    HPI:  72 yo F with PMH of CAD s/p CABG in 2007, ICM, HFrEF, T2DM, DVT, HTN, HLD, LBBB, obesity, OSA, and VT s/p PPM.  Presented to the ED on 1/5 after a defibrillator shock the night prior. Patient was asked to be seen due to acute illness causing a possible electrolyte imbalance. K and Mag above goal. Started on amiodarone. CXR with mild edema. ECHO 1/6 showed LVEF 25-30% (was 50% in 2021), global hypokinesis, G1DD, RV mildly reduced. LHC today.  Current HF Medications: Beta Blocker: carvedilol 12.5 mg BID SGLT2i: Farxiga 10 mg daily Other: Imdur 30 mg daily  Prior to admission HF Medications: Diuretic: furosemide 40 mg daily Beta blocker: carvedilol 6.25 mg BID ACE/ARB/ARNI: lisinopril 5 mg daily SGLT2i: Farxiga 10 mg daily Other: Imdur 30 mg daily  Pertinent Lab Values: Serum creatinine 1.04, BUN 20, Potassium 4.3, Sodium 136, Magnesium 2.1, A1c 7.7   Vital Signs: Weight: 236 lbs (admission weight: 232 lbs) Blood pressure: 150/70s  Heart rate: 50s  I/O: -1.1L yesterday; net -2.4L  Medication Assistance / Insurance Benefits Check: Does the patient have prescription insurance?  Yes Type of insurance plan: [place] Medicaid  Outpatient Pharmacy:  Prior to admission outpatient pharmacy: [place] mail order Is the patient willing to use [place] at discharge? Yes Is the patient willing to transition their outpatient pharmacy to utilize a [facility] outpatient pharmacy?   No    Assessment: 1. Acute on chronic systolic CHF (LVEF 17-51%), pending LHC. NYHA class II symptoms. - Not volume overloaded on exam, pending cath to determine volume further. Strict I/Os and daily weights. Keep K>4 and Mag>2. - Continue carvedilol 12.5 mg BID - Consider starting Entresto prior to discharge. Last dose of lisinopril was 1/4.  - Continue Farxiga 10 mg daily -  Continue Imdur 30 mg daily   Plan: 1) Medication changes recommended at this time: - Start Entresto 24/26 mg BID after cath  2) Patient assistance: - Has [place] Medicaid  3)  Education  - To be completed prior to discharge  Kerby Nora, PharmD, BCPS Heart Failure Cytogeneticist Phone [phone]

## 2022-01-15 NOTE — Progress Notes (Signed)
ANTICOAGULATION CONSULT NOTE - Initial Consult  Pharmacy Consult for heparin Indication:  Hx of DVT   Allergies  Allergen Reactions   Metformin And Related Itching, Swelling and Other (See Comments)    Leg pain & swelling in legs   Ozempic (0.25 Or 0.5 Mg-Dose) [Semaglutide(0.25 Or 0.'5mg'$ -Dos)] Other (See Comments)    Pt with hx of pancreatitis    Rosuvastatin    Atorvastatin Calcium Itching and Rash   Levofloxacin Itching    Patient Measurements: Height: 5' (152.4 cm) Weight: 107.3 kg (236 lb 9.6 oz) IBW/kg (Calculated) : 45.5 Heparin Dosing Weight: 71.4 kg  Vital Signs: Temp: 98.4 F (36.9 C) (01/08 0736) Temp Source: Oral (01/08 0736) BP: 158/76 (01/08 1340) Pulse Rate: 53 (01/08 1340)  Labs: Recent Labs    01/12/22 1750 01/12/22 2101 01/13/22 0050 01/14/22 0052 01/15/22 0034 01/15/22 0621  HGB  --   --   --   --  12.5  --   HCT  --   --   --   --  39.4  --   PLT  --   --   --   --  133*  --   APTT  --   --   --   --  56* 84*  HEPARINUNFRC  --   --   --   --  >1.10*  --   CREATININE  --    < > 1.09* 1.09* 1.04*  --   TROPONINIHS 31*  --   --   --   --   --    < > = values in this interval not displayed.     Estimated Creatinine Clearance: 55 mL/min (A) (by C-G formula based on SCr of 1.04 mg/dL (H)).   Medical History: Past Medical History:  Diagnosis Date   Abnormal LFTs    AICD (automatic cardioverter/defibrillator) present    Angina decubitus 11/06/2012   Angina pectoris ([place])    Anxiety    Arthritis    Back pain    Bundle branch block 05/2016   CAD (coronary artery disease) 06/18/2010   CHF (congestive heart failure) (HCC)    Chronic combined systolic and diastolic CHF, NYHA class 3 (HCC)    Chronic combined systolic and diastolic heart failure ([place])    Chronic systolic dysfunction of left ventricle 09/24/2012   Coronary artery disease    s/p CABG 2007   Depression    Dermal mycosis    Diabetes mellitus    type II   DKA (diabetic  ketoacidoses) 01/12/2019   DVT (deep venous thrombosis) ([place]) 09/2015   bilateral   Edema, lower extremity    Glaucoma    Hyperlipidemia    Hypertension    Hypoglycemia 09/09/2012   Insomnia    Insulin dependent diabetes mellitus ([place]) 04/23/2012   Ischemic cardiomyopathy    EF 30-35%, s/p ICD 4/08 by Dr Leonia Reeves   Joint pain    LBBB (left bundle branch block) 09/24/2012   Morbid obesity ([place]) 11/06/2012   Obesity    Oxygen dependent 2 L   Pain in left knee    Peripheral neuropathy    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   LIMA to D2, SVG to ramus intermediate, sequential SVG to OM1-OM2, SVG to RCA with EVH via both legs    Sleep apnea    uses O2 at night   Tinea    Type 2 diabetes mellitus with hyperglycemia, with long-term current use of insulin ([place]) 04/20/2019   Uncontrolled diabetes  mellitus 02/2019   Ventricular tachycardia ([place]) 01/16/15   sustained VT terminated with ATP, CL 340 msec   Vitamin B 12 deficiency 11/05/2018   Vitamin D deficiency 11/05/2018     Assessment: 72 YO female presenting 1/5 for evaluation of ICD shock. Past medical history of CAD s/p CABG 2007, ICM, chronic systolic CHF, DM2, h/o DVT, HTN, HLD, LBBB, obesity, OSA, and VT. Patient is on Eliquis prior to admission for history of DVT (BL 09/2015 and chronic LLE DVT since) with last dose 1/7 @ 0824. Pharmacy consulted for heparin in anticipation of [facility] Monday, 1/8.   Heparin level >1.1 is still affected by apixaban, aPTT is 84 and therapeutic on 1200 units/hr.   Goal of Therapy:  Heparin level 0.3-0.7 units/ml aPTT 66-102 seconds Monitor platelets by anticoagulation protocol: Yes   Plan:  Continue heparin infusion @ 1200 units/hr    F/u aPTT until correlates with heparin level  Monitor daily heparin level, CBC Monitor for signs/symptoms of bleeding  F/u cath   ADDENDUM 1410: Cath found patent grafts and severe native vessel disease with plan for medical management. Pharmacy consulted to restart heparin 6 hours  after sheath pull, which occurred at 13:40.   Benetta Spar, PharmD, BCPS, BCCP Clinical Pharmacist  Please check AMION for all [facility] phone numbers After 10:00 PM, call [place] [phone]

## 2022-01-15 NOTE — Discharge Summary (Signed)
ELECTROPHYSIOLOGY PROCEDURE DISCHARGE SUMMARY    Patient ID: [name],  MRN: [id number], DOB/AGE: 1950/04/21 72 y.o.  Admit date: 01/12/2022 Discharge date: 01/16/21  Primary Care Physician: Everardo Beals, NP  Primary Cardiologist: Dr. Marlou Porch Electrophysiologist: Dr. Quentin Ore  Primary Discharge Diagnosis:  VT  Secondary Discharge Diagnosis:  CAD ICM Chronic CHF CRT-D in place DM DVT HTN LBBB  Allergies  Allergen Reactions   Metformin And Related Itching, Swelling and Other (See Comments)    Leg pain & swelling in legs   Ozempic (0.25 Or 0.5 Mg-Dose) [Semaglutide(0.25 Or 0.'5mg'$ -Dos)] Other (See Comments)    Pt with hx of pancreatitis    Rosuvastatin    Atorvastatin Calcium Itching and Rash   Levofloxacin Itching     Procedures This Admission:  1. [place] 01/15/21 with stable CAD Dr. Gwenlyn Found  Brief HPI: [name] is a 72 y.o. female w/PMHx including above noted via device clinic that she had ICD therapy delivered for VF, she was unaware.  She reported a couple days of preceding GI symptoms/N/V/d and with concern of potential electrolyte derangement advise dto go to the ER. Her K+ elevated at 6.1 (Istat), 4.1 by lab, labs otherwise fairly unremarkable, HS Trop low and flat  Hospital Course:  The patient was admitted, her home coreg increased  And started on amiodarone gtt.  She was felt to be volume OL and treated with IV lasix.  TTE noted new reduction in LVEF from 50% > 25-30% and given her hx, planned for LHC.  Amiodarone gtt transitioned to PO with no further VT Cath 01/15/21 noted severe native vessel CAD, patent grafts, her VT/VF not felt to be ischemic driven   She was monitored on telemetry throughout her stay with no recurrent VT. R groin site was without hematoma or ecchymosis.   Wound care, and restrictions were reviewed with the patient.  The patient feels well, denies any CP or SOB, no cath site discomfort,  she was examined by Dr. Quentin Ore  and considered stable for discharge to home.   The patient was advised of [place] law, no driving 6 months   Amiodarone '400mg'$  daily until her follow up in 2 weeks She has not had her ACE since here, will transition to [facility] Increase her home coreg dose to 12.'5mg'$  BID  Physical Exam: Vitals:   01/15/22 1325 01/15/22 1330 01/15/22 1335 01/15/22 1340  BP: (!) 150/71 (!) 151/71 (!) 160/73 (!) 158/76  Pulse: (!) 54 (!) 52 (!) 52 (!) 53  Resp:      Temp:      TempSrc:      SpO2: 97% 96% 96% 96%  Weight:      Height:        GEN- The patient is well appearing, alert and oriented x 3 today.   HEENT: normocephalic, atraumatic; sclera clear, conjunctiva pink; hearing intact; oropharynx clear Lungs- CTA b/l, normal work of breathing.  No wheezes, rales, rhonchi Heart- RRR, no murmurs, rubs or gallops, PMI not laterally displaced GI- soft, non-tender, non-distended Extremities- no clubbing, cyanosis, or edema  R  groin site is without bleeding, hematoma, bruit, is soft,  non-tender MS- no significant deformity or atrophy Skin- warm and dry, no rash or lesion Psych- euthymic mood, full affect Neuro- no gross defecits  Labs:   Lab Results  Component Value Date   WBC 6.1 01/15/2022   HGB 12.5 01/15/2022   HCT 39.4 01/15/2022   MCV 92.7 01/15/2022  PLT 133 (L) 01/15/2022    Recent Labs  Lab 01/15/22 0034  NA 136  K 4.3  CL 102  CO2 26  BUN 20  CREATININE 1.04*  CALCIUM 9.8  GLUCOSE 184*    Discharge Medications:  Allergies as of 01/15/2022       Reactions   Metformin And Related Itching, Swelling, Other (See Comments)   Leg pain & swelling in legs   Ozempic (0.25 Or 0.5 Mg-dose) [semaglutide(0.25 Or 0.'5mg'$ -dos)] Other (See Comments)   Pt with hx of pancreatitis    Rosuvastatin    Atorvastatin Calcium Itching, Rash   Levofloxacin Itching     Med Rec must be completed prior to using this [facility]***       Disposition: [place]. Go in 10 day(s).   Specialty: Cardiology Why: Hospital follow up 01/26/2022 @ 2 pm PLEASE bring a current medication list to appointment FREE valet parking, Entrance C, off [employer] information: [address] [id number] [place] [phone]                Duration of Discharge Encounter: Greater than 30 minutes including physician time.  Venetia Night, PA-C 01/15/2022 4:24 PM

## 2022-01-15 NOTE — TOC Progression Note (Signed)
Transition of Care [facility]) - Progression Note    Patient Details  Name: [name] MRN: [id number] Date of Birth: July 05, 1950  [facility] CM/SW Contact  Zenon Mayo, RN Phone Number: 01/15/2022, 8:22 PM  Clinical Narrative:    From home, presents with ICD shock for VT ,  cardizem drip transition to po today.  For heart cath today.  TOC following.         Expected Discharge Plan and Services                                               Social Determinants of Health (SDOH) Interventions SDOH Screenings   Food Insecurity: No Food Insecurity (01/12/2022)  Housing: Low Risk  (01/12/2022)  Transportation Needs: No Transportation Needs (01/12/2022)  Utilities: Not At Risk (01/12/2022)  Alcohol Screen: Low Risk  (01/15/2022)  Depression (PHQ2-9): Low Risk  (06/19/2021)  Financial Resource Strain: Low Risk  (01/15/2022)  Tobacco Use: Low Risk  (01/15/2022)    Readmission Risk Interventions     No data to display

## 2022-01-15 NOTE — Progress Notes (Signed)
Heart Failure Nurse Navigator Progress Note  PCP: Everardo Beals, NP PCP-Cardiologist: Marlou Porch Admission Diagnosis: Ventricular fibrillation, Acute on chronic congestive heart failure Admitted from: Home  Presentation:   [name] presented due to she was told her AICD had fired (patient didn't feel it and feels fine) patient asked to come in with possible electrolyte imbalance, BP 125/110, HR 59, BMI 45.31, + for LE edema. , a HF TOC appointment was scheduled for 01/26/2022.   Patient was educated on the sign and symptoms of heart failure, daily weights, diet/ fluid restrictions, taking all medications as prescribed and attending all medical appointments, patient verbalized her understanding  ECHO/ LVEF: 20-25% new G1DD  Clinical Course:  Past Medical History:  Diagnosis Date   Abnormal LFTs    AICD (automatic cardioverter/defibrillator) present    Angina decubitus 11/06/2012   Angina pectoris ([place])    Anxiety    Arthritis    Back pain    Bundle branch block 05/2016   CAD (coronary artery disease) 06/18/2010   CHF (congestive heart failure) (HCC)    Chronic combined systolic and diastolic CHF, NYHA class 3 (HCC)    Chronic combined systolic and diastolic heart failure (HCC)    Chronic systolic dysfunction of left ventricle 09/24/2012   Coronary artery disease    s/p CABG 2007   Depression    Dermal mycosis    Diabetes mellitus    type II   DKA (diabetic ketoacidoses) 01/12/2019   DVT (deep venous thrombosis) ([place]) 09/2015   bilateral   Edema, lower extremity    Glaucoma    Hyperlipidemia    Hypertension    Hypoglycemia 09/09/2012   Insomnia    Insulin dependent diabetes mellitus ([place]) 04/23/2012   Ischemic cardiomyopathy    EF 30-35%, s/p ICD 4/08 by Dr Leonia Reeves   Joint pain    LBBB (left bundle branch block) 09/24/2012   Morbid obesity ([place]) 11/06/2012   Obesity    Oxygen dependent 2 L   Pain in left knee    Peripheral neuropathy    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    LIMA to D2, SVG to ramus intermediate, sequential SVG to OM1-OM2, SVG to RCA with EVH via both legs    Sleep apnea    uses O2 at night   Tinea    Type 2 diabetes mellitus with hyperglycemia, with long-term current use of insulin ([place]) 04/20/2019   Uncontrolled diabetes mellitus 02/2019   Ventricular tachycardia ([place]) 01/16/15   sustained VT terminated with ATP, CL 340 msec   Vitamin B 12 deficiency 11/05/2018   Vitamin D deficiency 11/05/2018     Social History   Socioeconomic History   Marital status: Legally Separated    Spouse name: Not on file   Number of children: 3   Years of education: 17   Highest education level: Not on file  Occupational History   Occupation: retired    Fish farm manager: UNEMPLOYED  Tobacco Use   Smoking status: Never   Smokeless tobacco: Never  Vaping Use   Vaping Use: Never used  Substance and Sexual Activity   Alcohol use: No   Drug use: No   Sexual activity: Not Currently  Other Topics Concern   Not on file  Social History Narrative   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   11/04/15 Lives with son    caffeine - coffee, 1-2 cups daily   Social Determinants of Health   Financial Resource Strain: Not on  file  Food Insecurity: No Food Insecurity (01/12/2022)   Hunger Vital Sign    Worried About Running Out of Food in the Last Year: Never true    Ran Out of Food in the Last Year: Never true  Transportation Needs: No Transportation Needs (01/12/2022)   PRAPARE - Hydrologist (Medical): No    Lack of Transportation (Non-Medical): No  Physical Activity: Not on file  Stress: Not on file  Social Connections: Not on file   Education Assessment and Provision:  Detailed education and instructions provided on heart failure disease management including the following:  Signs and symptoms of Heart Failure When to call the physician Importance of daily weights Low sodium diet Fluid  restriction Medication management Anticipated future follow-up appointments  Patient education given on each of the above topics.  Patient acknowledges understanding via teach back method and acceptance of all instructions.  Education Materials:  "Living Better With Heart Failure" Booklet, HF zone tool, & Daily Weight Tracker Tool.  Patient has scale at home: yes Patient has pill box at home: yes    High Risk Criteria for Readmission and/or Poor Patient Outcomes: Heart failure hospital admissions (last 6 months): 1  No Show rate: 9 % Difficult social situation: No Demonstrates medication adherence: Yes Primary Language: English Literacy level: Reading , writing, and comprehension  Barriers of Care:   Continued HF education  Considerations/Referrals:   Referral made to Heart Failure Pharmacist Stewardship: Yes Referral made to Heart Failure CSW/NCM TOC: No Referral made to Heart & Vascular TOC clinic: Yes, 01/26/2022  Items for Follow-up on DC/TOC: Continued HF education   Earnestine Leys, BSN, RN Heart Failure Leisure centre manager Chat Only

## 2022-01-16 ENCOUNTER — Other Ambulatory Visit (HOSPITAL_COMMUNITY): Payer: Self-pay

## 2022-01-16 DIAGNOSIS — I472 Ventricular tachycardia, unspecified: Secondary | ICD-10-CM | POA: Diagnosis not present

## 2022-01-16 LAB — BASIC METABOLIC PANEL
Anion gap: 7 (ref 5–15)
BUN: 19 mg/dL (ref 8–23)
CO2: 26 mmol/L (ref 22–32)
Calcium: 9.3 mg/dL (ref 8.9–10.3)
Chloride: 102 mmol/L (ref 98–111)
Creatinine, Ser: 0.88 mg/dL (ref 0.44–1.00)
GFR, Estimated: >60 mL/min (ref >60)
Glucose, Bld: 126 mg/dL — ABNORMAL HIGH (ref 70–99)
Potassium: 3.9 mmol/L (ref 3.5–5.1)
Sodium: 135 mmol/L (ref 135–145)

## 2022-01-16 LAB — GLUCOSE, CAPILLARY
Glucose-Capillary: 138 mg/dL — ABNORMAL HIGH (ref 70–99)
Glucose-Capillary: 239 mg/dL — ABNORMAL HIGH (ref 70–99)
Glucose-Capillary: 169 mg/dL — ABNORMAL HIGH (ref 70–99)
Glucose-Capillary: 253 mg/dL — ABNORMAL HIGH (ref 70–99)

## 2022-01-16 LAB — APTT
aPTT: 63 s — ABNORMAL HIGH (ref 24–36)
aPTT: 75 s — ABNORMAL HIGH (ref 24–36)

## 2022-01-16 LAB — CBC
HCT: 37.9 % (ref 36.0–46.0)
Hemoglobin: 12.7 g/dL (ref 12.0–15.0)
MCH: 30.2 pg (ref 26.0–34.0)
MCHC: 33.5 g/dL (ref 30.0–36.0)
MCV: 90.2 fL (ref 80.0–100.0)
Platelets: 130 10*3/uL — ABNORMAL LOW (ref 150–400)
RBC: 4.2 MIL/uL (ref 3.87–5.11)
RDW: 15.7 % — ABNORMAL HIGH (ref 11.5–15.5)
WBC: 5.2 10*3/uL (ref 4.0–10.5)
nRBC: 0 % (ref 0.0–0.2)

## 2022-01-16 LAB — HEPARIN LEVEL (UNFRACTIONATED): Heparin Unfractionated: 0.86 IU/mL — ABNORMAL HIGH (ref 0.30–0.70)

## 2022-01-16 LAB — MAGNESIUM: Magnesium: 2.1 mg/dL (ref 1.7–2.4)

## 2022-01-16 MED ORDER — APIXABAN 5 MG PO TABS
5.0000 mg | ORAL_TABLET | Freq: Two times a day (BID) | ORAL | Status: DC
  Administered 2022-01-16 – 2022-01-17 (×3): 5 mg via ORAL
  Filled 2022-01-16 (×3): qty 1

## 2022-01-16 MED ORDER — SACUBITRIL-VALSARTAN 24-26 MG PO TABS
1.0000 | ORAL_TABLET | Freq: Two times a day (BID) | ORAL | Status: DC
  Administered 2022-01-16 – 2022-01-17 (×2): 1 via ORAL
  Filled 2022-01-16 (×2): qty 1

## 2022-01-16 NOTE — Progress Notes (Signed)
   Heart Failure Stewardship Pharmacist Progress Note   PCP: Everardo Beals, NP PCP-Cardiologist: Candee Furbish, MD    HPI:  72 yo F with PMH of CAD s/p CABG in 2007, ICM, HFrEF, T2DM, DVT, HTN, HLD, LBBB, obesity, OSA, and VT s/p PPM.  Presented to the ED on 1/5 after a defibrillator shock the night prior. Patient was asked to be seen due to acute illness causing a possible electrolyte imbalance. K and Mag above goal. Started on amiodarone. CXR with mild edema. ECHO 1/6 showed LVEF 25-30% (was 50% in 2021), global hypokinesis, G1DD, RV mildly reduced. LHC 1/9 showed patent grafts and new mild nonobstructive disease in RCA, medical management only. LVEDP 21.  Current HF Medications: Beta Blocker: carvedilol 12.5 mg BID SGLT2i: Farxiga 10 mg daily Other: Imdur 30 mg daily  Prior to admission HF Medications: Diuretic: furosemide 40 mg daily Beta blocker: carvedilol 6.25 mg BID ACE/ARB/ARNI: lisinopril 5 mg daily SGLT2i: Farxiga 10 mg daily Other: Imdur 30 mg daily  Pertinent Lab Values: Serum creatinine 0.88, BUN 19, Potassium 3.9, Sodium 135, Magnesium 2.1, A1c 7.7   Vital Signs: Weight: 237 lbs (admission weight: 232 lbs) Blood pressure: 150/70s  Heart rate: 50s  I/O: -1.2L yesterday; net -3.4L  Medication Assistance / Insurance Benefits Check: Does the patient have prescription insurance?  Yes Type of insurance plan: [place] Medicaid  Outpatient Pharmacy:  Prior to admission outpatient pharmacy: [facility] mail order Is the patient willing to use [place] at discharge? Yes Is the patient willing to transition their outpatient pharmacy to utilize a [facility] outpatient pharmacy?   No    Assessment: 1. Acute on chronic systolic CHF (LVEF 46-28%), pending LHC. NYHA class II symptoms. - Not volume overloaded on exam. Strict I/Os and daily weights. Keep K>4 and Mag>2. - Continue carvedilol 12.5 mg BID - Consider starting Entresto today. Last dose of lisinopril was  1/4.  - Continue Farxiga 10 mg daily - Continue Imdur 30 mg daily   Plan: 1) Medication changes recommended at this time: - Start Entresto 24/26 mg BID   2) Patient assistance: - Has [place] Medicaid, copays $0  3)  Education  - Patient has been educated on current HF medications and potential additions to HF medication regimen - Patient verbalizes understanding that over the next few months, these medication doses may change and more medications may be added to optimize HF regimen - Patient has been educated on basic disease state pathophysiology and goals of therapy   Kerby Nora, PharmD, BCPS Heart Failure Stewardship Pharmacist Phone [phone]

## 2022-01-16 NOTE — Progress Notes (Signed)
   01/16/22 1100  Mobility  Activity Ambulated with assistance to bathroom  Level of Assistance Standby assist, set-up cues, supervision of patient - no hands on  Assistive Device Front wheel walker  Distance Ambulated (ft) 20 ft  Activity Response Tolerated well  Mobility Referral Yes  $Mobility charge 1 Mobility   Mobility Specialist Progress Note  Pt requesting to use BR. Had no c/o pain throughout. Returned to EOB w/ all needs met and RN in room.   Lucious Groves Mobility Specialist  Please contact via SecureChat or Rehab office at [phone]

## 2022-01-16 NOTE — Progress Notes (Addendum)
ANTICOAGULATION CONSULT NOTE - Initial Consult  Pharmacy Consult for heparin Indication:  Hx of DVT   Allergies  Allergen Reactions   Metformin And Related Itching, Swelling and Other (See Comments)    Leg pain & swelling in legs   Ozempic (0.25 Or 0.5 Mg-Dose) [Semaglutide(0.25 Or 0.'5mg'$ -Dos)] Other (See Comments)    Pt with hx of pancreatitis    Rosuvastatin    Atorvastatin Calcium Itching and Rash   Levofloxacin Itching    Patient Measurements: Height: 5' (152.4 cm) Weight: 107.7 kg (237 lb 8 oz) IBW/kg (Calculated) : 45.5 Heparin Dosing Weight: 71.4 kg  Vital Signs: Temp: 98.8 F (37.1 C) (01/09 0610) Temp Source: Oral (01/09 0610) BP: 159/61 (01/09 0803) Pulse Rate: 57 (01/09 0610)  Labs: Recent Labs    01/14/22 0052 01/15/22 0034 01/15/22 0034 01/15/22 0621 01/16/22 0115 01/16/22 0736  HGB  --  12.5  --   --  12.7  --   HCT  --  39.4  --   --  37.9  --   PLT  --  133*  --   --  130*  --   APTT  --  56*   < > 84* 63* 75*  HEPARINUNFRC  --  >1.10*  --   --  0.86*  --   CREATININE 1.09* 1.04*  --   --  0.88  --    < > = values in this interval not displayed.    Estimated Creatinine Clearance: 65.2 mL/min (by C-G formula based on SCr of 0.88 mg/dL).   Medical History: Past Medical History:  Diagnosis Date   Abnormal LFTs    AICD (automatic cardioverter/defibrillator) present    Angina decubitus 11/06/2012   Angina pectoris ([place])    Anxiety    Arthritis    Back pain    Bundle branch block 05/2016   CAD (coronary artery disease) 06/18/2010   CHF (congestive heart failure) (HCC)    Chronic combined systolic and diastolic CHF, NYHA class 3 (HCC)    Chronic combined systolic and diastolic heart failure (HCC)    Chronic systolic dysfunction of left ventricle 09/24/2012   Coronary artery disease    s/p CABG 2007   Depression    Dermal mycosis    Diabetes mellitus    type II   DKA (diabetic ketoacidoses) 01/12/2019   DVT (deep venous thrombosis) ([place])  09/2015   bilateral   Edema, lower extremity    Glaucoma    Hyperlipidemia    Hypertension    Hypoglycemia 09/09/2012   Insomnia    Insulin dependent diabetes mellitus ([place]) 04/23/2012   Ischemic cardiomyopathy    EF 30-35%, s/p ICD 4/08 by Dr Leonia Reeves   Joint pain    LBBB (left bundle branch block) 09/24/2012   Morbid obesity ([place]) 11/06/2012   Obesity    Oxygen dependent 2 L   Pain in left knee    Peripheral neuropathy    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   LIMA to D2, SVG to ramus intermediate, sequential SVG to OM1-OM2, SVG to RCA with EVH via both legs    Sleep apnea    uses O2 at night   Tinea    Type 2 diabetes mellitus with hyperglycemia, with long-term current use of insulin ([place]) 04/20/2019   Uncontrolled diabetes mellitus 02/2019   Ventricular tachycardia ([place]) 01/16/15   sustained VT terminated with ATP, CL 340 msec   Vitamin B 12 deficiency 11/05/2018   Vitamin D deficiency 11/05/2018  Assessment: 72 YO female presenting 1/5 for evaluation of ICD shock. Past medical history of CAD s/p CABG 2007, ICM, chronic systolic CHF, DM2, h/o DVT, HTN, HLD, LBBB, obesity, OSA, and VT. Patient is on Eliquis prior to admission for history of DVT (BL 09/2015 and chronic LLE DVT since) with last dose 1/7 @ 0824. Pharmacy consulted for heparin in anticipation of [facility] Monday, 1/8.   Heparin level 0.86 is still affected by apixaban and not correlating with aPTT. aPTT is 75 and therapeutic on 1200 units/hr.  Goal of Therapy:  Heparin level 0.3-0.7 units/ml aPTT 66-102 seconds Monitor platelets by anticoagulation protocol: Yes   Plan:  Continue heparin infusion @ 1200 units/hr    F/u aPTT until correlates with heparin level  Monitor daily aPTT, heparin level, CBC Monitor for signs/symptoms of bleeding  F/u restart apixaban   ADDENDUM 9:15: Ok to transition back to apixaban per cardiology. Stop heparin, restart apixaban 5 mg BID   Benetta Spar, PharmD, BCPS, [facility] Clinical Pharmacist  Please  check AMION for all [place] phone numbers After 10:00 PM, call [place] [phone]

## 2022-01-16 NOTE — Plan of Care (Signed)
PT alert and oriented X4. Able to make needs known. Ambulating in room with standby assist. No s/s distress. Denies pain.  Problem: Education: Goal: Ability to describe self-care measures that may prevent or decrease complications (Diabetes Survival Skills Education) will improve Outcome: Progressing Goal: Individualized Educational Video(s) Outcome: Progressing   Problem: Coping: Goal: Ability to adjust to condition or change in health will improve Outcome: Progressing   Problem: Fluid Volume: Goal: Ability to maintain a balanced intake and output will improve Outcome: Progressing   Problem: Health Behavior/Discharge Planning: Goal: Ability to identify and utilize available resources and services will improve Outcome: Progressing Goal: Ability to manage health-related needs will improve Outcome: Progressing   Problem: Metabolic: Goal: Ability to maintain appropriate glucose levels will improve Outcome: Progressing   Problem: Nutritional: Goal: Maintenance of adequate nutrition will improve Outcome: Progressing Goal: Progress toward achieving an optimal weight will improve Outcome: Progressing   Problem: Skin Integrity: Goal: Risk for impaired skin integrity will decrease Outcome: Progressing   Problem: Tissue Perfusion: Goal: Adequacy of tissue perfusion will improve Outcome: Progressing   Problem: Education: Goal: Knowledge of General Education information will improve Description: Including pain rating scale, medication(s)/side effects and non-pharmacologic comfort measures Outcome: Progressing   Problem: Health Behavior/Discharge Planning: Goal: Ability to manage health-related needs will improve Outcome: Progressing   Problem: Clinical Measurements: Goal: Ability to maintain clinical measurements within normal limits will improve Outcome: Progressing Goal: Will remain free from infection Outcome: Progressing Goal: Diagnostic test results will  improve Outcome: Progressing Goal: Respiratory complications will improve Outcome: Progressing Goal: Cardiovascular complication will be avoided Outcome: Progressing   Problem: Activity: Goal: Risk for activity intolerance will decrease Outcome: Progressing   Problem: Nutrition: Goal: Adequate nutrition will be maintained Outcome: Progressing   Problem: Coping: Goal: Level of anxiety will decrease Outcome: Progressing   Problem: Elimination: Goal: Will not experience complications related to bowel motility Outcome: Progressing Goal: Will not experience complications related to urinary retention Outcome: Progressing   Problem: Pain Managment: Goal: General experience of comfort will improve Outcome: Progressing   Problem: Safety: Goal: Ability to remain free from injury will improve Outcome: Progressing   Problem: Skin Integrity: Goal: Risk for impaired skin integrity will decrease Outcome: Progressing   Problem: Education: Goal: Understanding of CV disease, CV risk reduction, and recovery process will improve Outcome: Progressing Goal: Individualized Educational Video(s) Outcome: Progressing   Problem: Activity: Goal: Ability to return to baseline activity level will improve Outcome: Progressing   Problem: Cardiovascular: Goal: Ability to achieve and maintain adequate cardiovascular perfusion will improve Outcome: Progressing Goal: Vascular access site(s) Level 0-1 will be maintained Outcome: Progressing   Problem: Health Behavior/Discharge Planning: Goal: Ability to safely manage health-related needs after discharge will improve Outcome: Progressing

## 2022-01-16 NOTE — TOC Benefit Eligibility Note (Signed)
Patient Teacher, English as a foreign language completed.    The patient is currently admitted and upon discharge could be taking Entresto 24-26 mg.  The current 30 day co-pay is $0.00.   The patient is insured through [place], [place] Patient Advocate Specialist [place] Patient Advocate Team Direct Number: [phone]  Fax: [phone]

## 2022-01-16 NOTE — Progress Notes (Signed)
   01/16/22 1202  Mobility  Activity Ambulated with assistance in hallway  Level of Assistance Contact guard assist, steadying assist  Assistive Device Front wheel walker  Distance Ambulated (ft) 180 ft  Activity Response Tolerated well  Mobility Referral Yes  $Mobility charge 1 Mobility   Mobility Specialist Progress Note  Pre-Mobility: 93% SpO2  Pt was in bed and agreeable. Had no c/o pain throughout ambulation. Returned to bed w/ all needs met and call bell in reach  [place] Specialist  Please contact via Solicitor or Rehab office at [phone]

## 2022-01-16 NOTE — Progress Notes (Signed)
   01/16/22 0955  Spiritual Encounters  Type of Visit Initial  Care provided to: Patient  Referral source Nurse (RN/NT/LPN)  Reason for visit Routine spiritual support  OnCall Visit No   Chaplain responded to a spiritual consult for prayer. I visited with the patient, [name], who looks forward to going home. Our prayer centered around this topic.   Danice Goltz [facility]  [phone]

## 2022-01-16 NOTE — Progress Notes (Signed)
Rounding Note    Patient Name: [name] Date of Encounter: 01/16/2022  [facility] Cardiologist: Candee Furbish, MD  EP: Dr. Quentin Ore  Subjective   Denies any complaints or concerns  Inpatient Medications    Scheduled Meds:  amiodarone  400 mg Oral Daily   apixaban  5 mg Oral BID   aspirin  81 mg Oral Daily   carvedilol  12.5 mg Oral BID WC   dapagliflozin propanediol  10 mg Oral Daily   ferrous sulfate  325 mg Oral QODAY   gabapentin  300 mg Oral QHS   insulin aspart  0-15 Units Subcutaneous TID WC   insulin aspart  0-5 Units Subcutaneous QHS   insulin glargine  20 Units Subcutaneous QHS   isosorbide mononitrate  30 mg Oral Daily   rosuvastatin  20 mg Oral q1800   sodium chloride flush  3 mL Intravenous Q12H   sodium chloride flush  3 mL Intravenous Q12H   sodium chloride flush  3 mL Intravenous Q12H   Continuous Infusions:  sodium chloride     sodium chloride Stopped (01/16/22 0944)   PRN Meds: sodium chloride, sodium chloride, acetaminophen, albuterol, baclofen, latanoprost, morphine injection, nitroGLYCERIN, sodium chloride flush, sodium chloride flush   Vital Signs    Vitals:   01/15/22 1922 01/16/22 0017 01/16/22 0610 01/16/22 0803  BP: (!) 136/59 (!) 136/59 (!) 160/70 (!) 159/61  Pulse: (!) 57 (!) 57 (!) 57   Resp: 19 (!) '24 19 20  '$ Temp: 98.8 F (37.1 C) 98.8 F (37.1 C) 98.8 F (37.1 C)   TempSrc: Oral Oral Oral   SpO2: 97%     Weight:   107.7 kg   Height:        Intake/Output Summary (Last 24 hours) at 01/16/2022 1526 Last data filed at 01/16/2022 0827 Gross per 24 hour  Intake 593.97 ml  Output 900 ml  Net -306.03 ml      01/16/2022    6:10 AM 01/15/2022   12:18 PM 01/15/2022    6:00 AM  Last 3 Weights  Weight (lbs) 237 lb 8 oz 236 lb 9.6 oz 262 lb 5.6 oz  Weight (kg) 107.729 kg 107.321 kg 119 kg      Telemetry    SB, 50's - Personally Reviewed  ECG    No new EKGs - Personally Reviewed  Physical Exam   GEN: No acute  distress.   Neck: No JVD Cardiac: RRR, no murmurs, rubs, or gallops.  Respiratory: CTA b/l. GI: Soft, nontender, non-distended  MS: No edema; No deformity. Neuro:  Nonfocal  Psych: Normal affect   Labs    High Sensitivity Troponin:   Recent Labs  Lab 01/12/22 1407 01/12/22 1750  TROPONINIHS 30* 31*     Chemistry Recent Labs  Lab 01/14/22 0052 01/15/22 0034 01/16/22 0115  NA 135 136 135  K 3.8 4.3 3.9  CL 99 102 102  CO2 '28 26 26  '$ GLUCOSE 136* 184* 126*  BUN '22 20 19  '$ CREATININE 1.09* 1.04* 0.88  CALCIUM 9.3 9.8 9.3  MG 2.0 2.1 2.1  GFRNONAA 54* 57* >60  ANIONGAP '8 8 7    '$ Lipids No results for input(s): "CHOL", "TRIG", "HDL", "LABVLDL", "LDLCALC", "CHOLHDL" in the last 168 hours.  Hematology Recent Labs  Lab 01/12/22 1036 01/12/22 1406 01/15/22 0034 01/16/22 0115  WBC 6.5  --  6.1 5.2  RBC 4.68  --  4.25 4.20  HGB 14.0 14.6 12.5 12.7  HCT 44.6  43.0 39.4 37.9  MCV 95.3  --  92.7 90.2  MCH 29.9  --  29.4 30.2  MCHC 31.4  --  31.7 33.5  RDW 16.0*  --  16.0* 15.7*  PLT 137*  --  133* 130*   Thyroid No results for input(s): "TSH", "FREET4" in the last 168 hours.  BNPNo results for input(s): "BNP", "PROBNP" in the last 168 hours.  DDimer No results for input(s): "DDIMER" in the last 168 hours.   Radiology      Cardiac Studies    01/15/22: LHC IMPRESSION: Ms. Juanito Doom has patent grafts and severe native vessel disease.  The only change in her anatomy is a 50% lesion in the mid shaft of the RCA vein graft that did not appear to be obstructive.  Her LVEDP was measured at 21.  The etiology of her LV EF decline is not ischemically mediated.  Guideline directed optimal medical therapy will be recommended.  A Mynx closure device was successfully deployed achieving hemostasis.  Patient left lab stable condition.  Dr. Lars Mage, her electrophysiologist, was made aware of these results.     01/13/22: TTE 1. Left ventricular ejection fraction, by estimation, is 25  to 30%. The  left ventricle has severely decreased function. The left ventricle  demonstrates global hypokinesis. There is moderate left ventricular  hypertrophy. Left ventricular diastolic  parameters are consistent with Grade I diastolic dysfunction (impaired  relaxation).   2. Right ventricular systolic function is mildly reduced. The right  ventricular size is mildly enlarged. There is normal pulmonary artery  systolic pressure. The estimated right ventricular systolic pressure is  42.5 mmHg.   3. The mitral valve is grossly normal. No evidence of mitral valve  regurgitation. No evidence of mitral stenosis.   4. The aortic valve was not well visualized. Aortic valve regurgitation  is not visualized. Aortic valve sclerosis/calcification is present,  without any evidence of aortic stenosis.   5. The inferior vena cava is normal in size with greater than 50%  respiratory variability, suggesting right atrial pressure of 3 mmHg.   Comparison(s): Changes from prior study are noted. LVEF worsened from 50%  in 2021 to 25% now.   01/19/2019: LHC Severe diffuse three-vessel coronary disease with functional total occlusion of the mid LAD, total occlusion of the large first diagonal, total occlusion of the proximal ramus intermedius, total occlusion of the proximal circumflex, and total occlusion of the mid RCA.  All vessels are heavily calcified. Patent saphenous vein graft to the PDA.  Moderate diffuse disease beyond the graft insertion site Patent sequential saphenous vein graft to the first and second obtuse marginal.  Diffuse native vessel disease beyond the bypass graft insertion site. Patent saphenous vein graft to the ramus intermedius.  Diffuse native vessel disease beyond the graft insertion site. Patent left internal mammary graft to the dominant diagonal.  Diffuse native vessel disease beyond the graft insertion site. Chronic systolic heart failure with EF less than 35% and LVEDP 22  mmHg.     01/13/2019: TTE IMPRESSIONS   1. Suboptimal image quality for diagnosis of biventricular function and  wall motion.   2. Left ventricular ejection fraction, by visual estimation, is 50%. The  left ventricle has limited views for evaluation of function. Possible  apical wall motion abnormality. There is mildly increased left ventricular  hypertrophy.   3. Global right ventricle was not well visualized.The right ventricular  size is not well visualized. Right vetricular wall thickness was not  assessed.  4. Left atrial size was not well visualized.   5. Right atrial size was not well visualized.   6. Mild mitral annular calcification.   7. The mitral valve is abnormal. No evidence of mitral valve  regurgitation. No evidence of mitral stenosis.   8. The tricuspid valve is not well visualized.   9. The aortic valve was not well visualized. Aortic valve regurgitation  is not visualized. No evidence of aortic valve sclerosis or stenosis.   Patient Profile     72 y.o. female w/PMHx of CAD (CABG 2007), ICM, chronic CHF (systlic), DM (w/hx of DKA), DVT, HTN, HLD, LBBB, obesity, OSA (w/O2 at [facility]), VT, ICD   Admitted for appropriate ICD chock for VT   Device information [employer] ICD implanted 05/01/2006 >> attempts at CRT upgrade and gen change 09/25/2012  (No suitable veins for LV lead) CRT device, though LV port is plugged + appropriate therapy  01/18/2019, VF Jan 2024, VT  Assessment & Plan    1.  VT/VF H/o appropriate therapy in 01/18/2019 as well.  Increased coreg to 12.5 mg BID Hyperkalemic on admission (?spurious) K+ 4.3 today Mag 2.1 Amiodarone gtt > PO  Home coreg was increased here No further VT    2. Chronic systolic CHF  EF ~09% 08/1189 Now 25-30% Volume status appears stable Trace 1+ edema LE, lungs are clear Creat stable Will start Entresto   3. ICM No s/s ischemia Cath in 2021 with patent grafts LHC with stable CAD   4. LBBB No suitable veins for  upgrade with previous attempt.  Minimal RV pacing.   5. Obesity Body mass index is 45.31 kg/m.  Have encouraged weight loss.    6. H/o DVT Back on her Eliquis   7. DM2 SSI ordered  Patient was planned to discharge though she and her family feel too unsafe with deteriorating driving conditions severe weather alerts, to be driving her home/being on the road.  Will plan d/c tomorrow  For questions or updates, please contact [place] Please consult [email or web address] for contact info under        Signed, Baldwin [place], PA-C  01/16/2022, 3:26 PM

## 2022-01-17 ENCOUNTER — Other Ambulatory Visit (HOSPITAL_COMMUNITY): Payer: Self-pay

## 2022-01-17 LAB — CBC
HCT: 38.9 % (ref 36.0–46.0)
Hemoglobin: 12.9 g/dL (ref 12.0–15.0)
MCH: 30.4 pg (ref 26.0–34.0)
MCHC: 33.2 g/dL (ref 30.0–36.0)
MCV: 91.7 fL (ref 80.0–100.0)
Platelets: 138 10*3/uL — ABNORMAL LOW (ref 150–400)
RBC: 4.24 MIL/uL (ref 3.87–5.11)
RDW: 15.6 % — ABNORMAL HIGH (ref 11.5–15.5)
WBC: 5.3 10*3/uL (ref 4.0–10.5)
nRBC: 0 % (ref 0.0–0.2)

## 2022-01-17 LAB — BASIC METABOLIC PANEL
Anion gap: 7 (ref 5–15)
BUN: 18 mg/dL (ref 8–23)
CO2: 25 mmol/L (ref 22–32)
Calcium: 9.6 mg/dL (ref 8.9–10.3)
Chloride: 103 mmol/L (ref 98–111)
Creatinine, Ser: 0.99 mg/dL (ref 0.44–1.00)
GFR, Estimated: >60 mL/min (ref >60)
Glucose, Bld: 182 mg/dL — ABNORMAL HIGH (ref 70–99)
Potassium: 4.1 mmol/L (ref 3.5–5.1)
Sodium: 135 mmol/L (ref 135–145)

## 2022-01-17 LAB — GLUCOSE, CAPILLARY: Glucose-Capillary: 176 mg/dL — ABNORMAL HIGH (ref 70–99)

## 2022-01-17 LAB — MAGNESIUM: Magnesium: 2.1 mg/dL (ref 1.7–2.4)

## 2022-01-17 LAB — LIPOPROTEIN A (LPA): Lipoprotein (a): 192 nmol/L — ABNORMAL HIGH (ref <75.0)

## 2022-01-17 MED ORDER — ENTRESTO 24-26 MG PO TABS
1.0000 | ORAL_TABLET | Freq: Two times a day (BID) | ORAL | 3 refills | Status: DC
  Filled 2022-01-17: qty 60, 30d supply, fill #0

## 2022-01-17 MED ORDER — AMIODARONE HCL 200 MG PO TABS
200.0000 mg | ORAL_TABLET | Freq: Every day | ORAL | 5 refills | Status: DC
  Filled 2022-01-17: qty 37, 30d supply, fill #0

## 2022-01-17 MED ORDER — CARVEDILOL 12.5 MG PO TABS
12.5000 mg | ORAL_TABLET | Freq: Two times a day (BID) | ORAL | 5 refills | Status: DC
  Filled 2022-01-17: qty 60, 30d supply, fill #0

## 2022-01-17 NOTE — Progress Notes (Signed)
   Heart Failure Stewardship Pharmacist Progress Note   PCP: Everardo Beals, NP PCP-Cardiologist: Candee Furbish, MD    HPI:  72 yo F with PMH of CAD s/p CABG in 2007, ICM, HFrEF, T2DM, DVT, HTN, HLD, LBBB, obesity, OSA, and VT s/p PPM.  Presented to the ED on 1/5 after a defibrillator shock the night prior. Patient was asked to be seen due to acute illness causing a possible electrolyte imbalance. K and Mag above goal. Started on amiodarone. CXR with mild edema. ECHO 1/6 showed LVEF 25-30% (was 50% in 2021), global hypokinesis, G1DD, RV mildly reduced. LHC 1/9 showed patent grafts and new mild nonobstructive disease in RCA, medical management only. LVEDP 21.  Discharge HF Medications: Diuretic: furosemide 40 mg daily Beta Blocker: carvedilol 12.5 mg BID ACE/ARB/ARNI: Entresto 24/26 mg BID SGLT2i: Farxiga 10 mg daily Other: Imdur 30 mg daily  Prior to admission HF Medications: Diuretic: furosemide 40 mg daily Beta blocker: carvedilol 6.25 mg BID ACE/ARB/ARNI: lisinopril 5 mg daily SGLT2i: Farxiga 10 mg daily Other: Imdur 30 mg daily  Pertinent Lab Values: Serum creatinine 0.99, BUN 18, Potassium 4.1, Sodium 135, Magnesium 2.1, A1c 7.7   Vital Signs: Weight: 231 lbs (admission weight: 232 lbs) Blood pressure: 150/60s  Heart rate: 50s  I/O: -1.2L yesterday; net -4.1L  Medication Assistance / Insurance Benefits Check: Does the patient have prescription insurance?  Yes Type of insurance plan: [place] Medicaid  Outpatient Pharmacy:  Prior to admission outpatient pharmacy: [facility] mail order Is the patient willing to use [facility] at discharge? Yes Is the patient willing to transition their outpatient pharmacy to utilize a [facility] outpatient pharmacy?   No    Assessment: 1. Acute on chronic systolic CHF (LVEF 96-22%), pending LHC. NYHA class II symptoms. - Not volume overloaded on exam. Agree with resuming furosemide 40 mg daily at discharge. Strict I/Os and daily  weights. Keep K>4 and Mag>2. - Continue carvedilol 12.5 mg BID - Continue Entresto 24/26 mg BID - Consider starting MRA at follow up (was on this in the past but stopped due to AKI) - Continue Farxiga 10 mg daily - Continue Imdur 30 mg daily   Plan: 1) Medication changes recommended at this time: - Discharge today - Start spironolactone at follow up  2) Patient assistance: - Has [place] Medicaid, copays $0  3)  Education  - Patient has been educated on current HF medications and potential additions to HF medication regimen - Patient verbalizes understanding that over the next few months, these medication doses may change and more medications may be added to optimize HF regimen - Patient has been educated on basic disease state pathophysiology and goals of therapy   Kerby Nora, PharmD, BCPS Heart Failure Stewardship Pharmacist Phone [phone]

## 2022-01-17 NOTE — TOC Transition Note (Signed)
Transition of Care [facility]) - CM/SW Discharge Note   Patient Details  Name: [name] MRN: [id number] Date of Birth: [birth year]  [facility] CM/SW Contact:  Zenon Mayo, RN Phone Number: 01/17/2022, 12:47 PM   Clinical Narrative:    Patient was dc home, she did not need assistance with transport, her family member was able to come to transport her home.          Patient Goals and CMS Choice      Discharge Placement                         Discharge Plan and Services Additional resources added to the After Visit Summary for                                       Social Determinants of Health (SDOH) Interventions SDOH Screenings   Food Insecurity: No Food Insecurity (01/12/2022)  Housing: Low Risk  (01/12/2022)  Transportation Needs: No Transportation Needs (01/12/2022)  Utilities: Not At Risk (01/12/2022)  Alcohol Screen: Low Risk  (01/15/2022)  Depression (PHQ2-9): Low Risk  (06/19/2021)  Financial Resource Strain: Low Risk  (01/15/2022)  Tobacco Use: Low Risk  (01/15/2022)     Readmission Risk Interventions     No data to display

## 2022-01-22 ENCOUNTER — Ambulatory Visit (INDEPENDENT_AMBULATORY_CARE_PROVIDER_SITE_OTHER): Payer: Medicare HMO

## 2022-01-22 DIAGNOSIS — I5042 Chronic combined systolic (congestive) and diastolic (congestive) heart failure: Secondary | ICD-10-CM | POA: Diagnosis not present

## 2022-01-22 DIAGNOSIS — I255 Ischemic cardiomyopathy: Secondary | ICD-10-CM

## 2022-01-23 LAB — CUP PACEART REMOTE DEVICE CHECK
Battery Remaining Longevity: 11 mo
Battery Remaining Percentage: 10 %
Battery Voltage: 2.69 V
Brady Statistic AP VP Percent: 1 %
Brady Statistic AP VS Percent: 19 %
Brady Statistic AS VP Percent: 1 %
Brady Statistic AS VS Percent: 80 %
Brady Statistic RA Percent Paced: 18 %
Brady Statistic RV Percent Paced: 1 %
Date Time Interrogation Session: 20240116125956
HighPow Impedance: 42 Ohm
HighPow Impedance: 42 Ohm
Implantable Lead Connection Status: 753985
Implantable Lead Connection Status: 753985
Implantable Lead Connection Status: 753985
Implantable Lead Implant Date: 20080423
Implantable Lead Implant Date: 20080423
Implantable Lead Implant Date: 20140918
Implantable Lead Location: 753858
Implantable Lead Location: 753859
Implantable Lead Location: 753860
Implantable Lead Model: 5076
Implantable Lead Model: 6947
Implantable Pulse Generator Implant Date: 20140918
Lead Channel Impedance Value: 440 Ohm
Lead Channel Impedance Value: 540 Ohm
Lead Channel Pacing Threshold Amplitude: 0.5 V
Lead Channel Pacing Threshold Amplitude: 0.75 V
Lead Channel Pacing Threshold Pulse Width: 0.5 ms
Lead Channel Pacing Threshold Pulse Width: 0.5 ms
Lead Channel Sensing Intrinsic Amplitude: 1.8 mV
Lead Channel Sensing Intrinsic Amplitude: 12 mV
Lead Channel Setting Pacing Amplitude: 2 V
Lead Channel Setting Pacing Amplitude: 2.5 V
Lead Channel Setting Pacing Pulse Width: 0.5 ms
Lead Channel Setting Sensing Sensitivity: 0.5 mV
Pulse Gen Serial Number: 7070007

## 2022-01-25 ENCOUNTER — Other Ambulatory Visit (HOSPITAL_COMMUNITY): Payer: Self-pay

## 2022-01-25 ENCOUNTER — Telehealth (HOSPITAL_COMMUNITY): Payer: Self-pay

## 2022-01-25 NOTE — Progress Notes (Signed)
   [facility] Transitions of Care Clinic Heart Failure Pharmacist Encounter  PCP: Everardo Beals, NP PCP-Cardiologist: Candee Furbish, MD  HPI:  72 yo female with PMH significant for OSA, DM, HTN, obesity, HLD CHF, CAD s/p CABG x5, h/o DVT, and VT w/ICD in place. She presented to the ED on 01/12/2022 d/t an ICD shock the night prior. Device stated that pt received a shock on 01/10/2022 at 0215. Pt was asleep and does not recall this event. She was evaluated d/t possible electrolyte imbalance from N/V/D.  ECHO on 01/13/2022 showed EF 25-30% (50% in 2021). LV had severely decreased function, global hypokinesis, mildly reduced RV function, and grade I diastolic dysfunction. LHC showed patent grafts and new mild nonobstructive disease in the RCA indicating that LV EF decline was not ischemically mediated. CXR showed mild pulmonary edema.   Today, [name] presents to the [facility] for follow up. She endorses SOB and edema. She endorses daily blood pressure checks at home. She initially reported medication adherence, however, she later reported she missed 3-4 doses of her Lasix since discharge. She stated that she keeps her medications in a hat box. She denied a pill box at this time stating "I will not use it." Her son, who lives with her, stated that he helps her with her medications.   HF Medications: Diuretic: furosemide '40mg'$  once daily Beta Blocker: carvedilol 12.'5mg'$  BID  ACE/ARB/ARNI: Entresto 24/'26mg'$  BID SGLT2i: Farxiga '10mg'$  once daily Other: Imdur '30mg'$  daily  Has the patient been experiencing any side effects to the medications prescribed?  no  Does the patient have any problems obtaining medications due to transportation or finances?   no  Understanding of regimen: fair Understanding of indications: fair Potential of compliance: fair Patient understands to avoid NSAIDs. Patient understands to avoid decongestants.  Vital Signs: Weight: 241 lbs  (discharge weight: 131 lbs) Blood pressure: 115/74  Heart rate: 54   Medication Assistance / Insurance Benefits Check: Does the patient have prescription insurance?  Yes Type of insurance plan: [place] Medicaid  Outpatient Pharmacy:  Current outpatient pharmacy: [facility] mail order Was the [facility] pharmacy used to supply discharge medications? yes Is the patient willing to transition their outpatient pharmacy to utilize a [facility] outpatient pharmacy with or without mail order?   No  Assessment: 1) Chronic systolic CHF (EF 59-16%), due to NICM. NYHA class II-III symptoms. - Continue Farxiga '10mg'$  once daily -Continue carvedilol 12.'5mg'$  BID -Continue Entresto 24/'26mg'$  BID  -Continue furosemide '40mg'$  once daily  Plan: 1) Medication changes: - Initiate spironolactone 12.'5mg'$  once daily  2) Follow up: - Next appointment with cardiology on 01/31/2022.  Maryan Puls, PharmD PGY-1 [name] Pharmacy Resident

## 2022-01-25 NOTE — Telephone Encounter (Signed)
Called to confirm Heart & Vascular Transitions of Care appointment at 01/26/22. Patient reminded to bring all medications and pill box organizer with them. Confirmed patient has transportation. Gave directions, instructed to utilize [place] parking.  Confirmed appointment prior to ending call.

## 2022-01-26 ENCOUNTER — Ambulatory Visit (HOSPITAL_COMMUNITY)
Admit: 2022-01-26 | Discharge: 2022-01-26 | Disposition: A | Discharge status: Home / Self Care | Payer: Medicare HMO | Attending: Cardiology | Admitting: Cardiology

## 2022-01-26 ENCOUNTER — Encounter (HOSPITAL_COMMUNITY): Payer: Self-pay

## 2022-01-26 ENCOUNTER — Other Ambulatory Visit (HOSPITAL_COMMUNITY): Payer: Self-pay

## 2022-01-26 VITALS — BP 115/74 | HR 54 | Wt 241.0 lb

## 2022-01-26 DIAGNOSIS — Z79899 Other long term (current) drug therapy: Secondary | ICD-10-CM | POA: Insufficient documentation

## 2022-01-26 DIAGNOSIS — E1142 Type 2 diabetes mellitus with diabetic polyneuropathy: Secondary | ICD-10-CM | POA: Diagnosis not present

## 2022-01-26 DIAGNOSIS — Z86718 Personal history of other venous thrombosis and embolism: Secondary | ICD-10-CM | POA: Diagnosis not present

## 2022-01-26 DIAGNOSIS — I110 Hypertensive heart disease with heart failure: Secondary | ICD-10-CM | POA: Diagnosis present

## 2022-01-26 DIAGNOSIS — E785 Hyperlipidemia, unspecified: Secondary | ICD-10-CM | POA: Diagnosis not present

## 2022-01-26 DIAGNOSIS — G4733 Obstructive sleep apnea (adult) (pediatric): Secondary | ICD-10-CM | POA: Diagnosis not present

## 2022-01-26 DIAGNOSIS — Z951 Presence of aortocoronary bypass graft: Secondary | ICD-10-CM | POA: Diagnosis not present

## 2022-01-26 DIAGNOSIS — I519 Heart disease, unspecified: Secondary | ICD-10-CM

## 2022-01-26 DIAGNOSIS — Z95810 Presence of automatic (implantable) cardiac defibrillator: Secondary | ICD-10-CM | POA: Diagnosis not present

## 2022-01-26 DIAGNOSIS — I472 Ventricular tachycardia, unspecified: Secondary | ICD-10-CM | POA: Diagnosis not present

## 2022-01-26 DIAGNOSIS — I2510 Atherosclerotic heart disease of native coronary artery without angina pectoris: Secondary | ICD-10-CM | POA: Diagnosis not present

## 2022-01-26 LAB — BASIC METABOLIC PANEL
Anion gap: 8 (ref 5–15)
BUN: 15 mg/dL (ref 8–23)
CO2: 22 mmol/L (ref 22–32)
Calcium: 9.6 mg/dL (ref 8.9–10.3)
Chloride: 110 mmol/L (ref 98–111)
Creatinine, Ser: 1.07 mg/dL — ABNORMAL HIGH (ref 0.44–1.00)
GFR, Estimated: 56 mL/min — ABNORMAL LOW (ref >60)
Glucose, Bld: 148 mg/dL — ABNORMAL HIGH (ref 70–99)
Potassium: 4.1 mmol/L (ref 3.5–5.1)
Sodium: 140 mmol/L (ref 135–145)

## 2022-01-26 LAB — BRAIN NATRIURETIC PEPTIDE: B Natriuretic Peptide: 86.9 pg/mL (ref 0.0–100.0)

## 2022-01-26 MED ORDER — FUROSEMIDE 40 MG PO TABS: 40.0000 mg | ORAL_TABLET | Freq: Every day | ORAL | 3 refills | Status: DC

## 2022-01-26 MED ORDER — NITROGLYCERIN 0.4 MG SL SUBL: 0.4000 mg | SUBLINGUAL_TABLET | SUBLINGUAL | 0 refills | Status: DC | PRN

## 2022-01-26 MED ORDER — SPIRONOLACTONE 25 MG PO TABS: 12.5000 mg | ORAL_TABLET | Freq: Every day | ORAL | 3 refills | Status: DC

## 2022-01-26 NOTE — Progress Notes (Signed)
HEART & VASCULAR TRANSITION OF CARE CONSULT NOTE     Referring Physician: Dr. Quentin Ore  Primary Care: Everardo Beals, NP Primary Cardiologist: Dr. Marlou Porch EP: Dr. Quentin Ore   HPI: Referred to clinic by Dr. Quentin Ore for heart failure consultation.   72 y/o female w/ h/o CAD s/p CABG in 2007, ICM, h/o chronic systolic heart failure w/ improved EF, LBBB, h/o VT s/p ICD, Type2 DM w/ h/o DKA, DVT, HTN, HLD.  Had CABG in 2007. EF at the time was 30% but improved over the years. Echo in 2017 EF 40-45%. Last Echo prior to recent was 01/2019.  EF was normal 50%. RV normal   Had COVID 19 infection 10/23.  01/12/22, patient was noted via device clinic that she had ICD therapy delivered for VF, she was unaware.  She reported a couple days of preceding GI symptoms N/V/D and with concern of potential electrolyte derangement advised to go to the ER. Her K+ elevated at 6.1 (Istat), 4.1 by lab, labs otherwise fairly unremarkable, HS Trop low and flat. She was admitted and home coreg increased. Started on amiodarone gtt.  She was felt to be volume OL and treated with IV lasix.  TTE noted new reduction in LVEF from 50% > 25-30%. LHC noted severe native vessel CAD and patent grafts. Her VT/VF was not felt to be ischemically driven.  Amiodarone gtt transitioned to PO with no further VT. GDMT added. Referred to [facility] clinic. D/c wt 230 lb.   Presents today w/ her son and daughter. Wt today elevated today at 143 lb. Admits that she has not been taking lasix daily as prescribed. She says she has missed 3-4 days of  lasix, taking all other meds as prescribed. She is SOB ambulating around her house. No symptoms at rest. Denies CP. No palpitations. No syncope/ near syncope. No ICD shocks. Unable to get device interrogation today (transmitter out of service)    Cardiac Testing   Echo 01/2022 EF 25-30%, RV normal  (previous 50% in 2021)  LHC 1/24   Mid LAD to Dist LAD lesion is 80% stenosed.   Prox Cx lesion is 100%  stenosed.   Prox RCA to Mid RCA lesion is 100% stenosed.   Ramus lesion is 100% stenosed.   1st Diag lesion is 100% stenosed.   Prox Graft lesion is 50% stenosed   Review of Systems: [y] = yes, '[ ]'$  = no   General: Weight gain '[ ]'$ ; Weight loss '[ ]'$ ; Anorexia '[ ]'$ ; Fatigue '[ ]'$ ; Fever '[ ]'$ ; Chills '[ ]'$ ; Weakness '[ ]'$   Cardiac: Chest pain/pressure '[ ]'$ ; Resting SOB '[ ]'$ ; Exertional SOB '[ ]'$ ; Orthopnea '[ ]'$ ; Pedal Edema '[ ]'$ ; Palpitations '[ ]'$ ; Syncope '[ ]'$ ; Presyncope '[ ]'$ ; Paroxysmal nocturnal dyspnea'[ ]'$   Pulmonary: Cough '[ ]'$ ; Wheezing'[ ]'$ ; Hemoptysis'[ ]'$ ; Sputum '[ ]'$ ; Snoring '[ ]'$   GI: Vomiting'[ ]'$ ; Dysphagia'[ ]'$ ; Melena'[ ]'$ ; Hematochezia '[ ]'$ ; Heartburn'[ ]'$ ; Abdominal pain '[ ]'$ ; Constipation '[ ]'$ ; Diarrhea '[ ]'$ ; BRBPR '[ ]'$   GU: Hematuria'[ ]'$ ; Dysuria '[ ]'$ ; Nocturia'[ ]'$   Vascular: Pain in legs with walking '[ ]'$ ; Pain in feet with lying flat '[ ]'$ ; Non-healing sores '[ ]'$ ; Stroke '[ ]'$ ; TIA '[ ]'$ ; Slurred speech '[ ]'$ ;  Neuro: Headaches'[ ]'$ ; Vertigo'[ ]'$ ; Seizures'[ ]'$ ; Paresthesias'[ ]'$ ;Blurred vision '[ ]'$ ; Diplopia '[ ]'$ ; Vision changes '[ ]'$   Ortho/Skin: Arthritis '[ ]'$ ; Joint pain '[ ]'$ ; Muscle pain '[ ]'$ ; Joint swelling '[ ]'$ ; Back Pain '[ ]'$ ; Rash '[ ]'$   Psych: Depression'[ ]'$ ; Anxiety'[ ]'$   Heme: Bleeding problems '[ ]'$ ; Clotting disorders '[ ]'$ ; Anemia '[ ]'$   Endocrine: Diabetes '[ ]'$ ; Thyroid dysfunction'[ ]'$    Past Medical History:  Diagnosis Date   Abnormal LFTs    AICD (automatic cardioverter/defibrillator) present    Angina decubitus 11/06/2012   Angina pectoris ([place])    Anxiety    Arthritis    Back pain    Bundle branch block 05/2016   CAD (coronary artery disease) 06/18/2010   CHF (congestive heart failure) (HCC)    Chronic combined systolic and diastolic CHF, NYHA class 3 (HCC)    Chronic combined systolic and diastolic heart failure ([place])    Chronic systolic dysfunction of left ventricle 09/24/2012   Coronary artery disease    s/p CABG 2007   Depression    Dermal mycosis    Diabetes mellitus    type II   DKA (diabetic ketoacidoses)  01/12/2019   DVT (deep venous thrombosis) ([place]) 09/2015   bilateral   Edema, lower extremity    Glaucoma    Hyperlipidemia    Hypertension    Hypoglycemia 09/09/2012   Insomnia    Insulin dependent diabetes mellitus ([place]) 04/23/2012   Ischemic cardiomyopathy    EF 30-35%, s/p ICD 4/08 by Dr Leonia Reeves   Joint pain    LBBB (left bundle branch block) 09/24/2012   Morbid obesity ([place]) 11/06/2012   Obesity    Oxygen dependent 2 L   Pain in left knee    Peripheral neuropathy    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   LIMA to D2, SVG to ramus intermediate, sequential SVG to OM1-OM2, SVG to RCA with EVH via both legs    Sleep apnea    uses O2 at night   Tinea    Type 2 diabetes mellitus with hyperglycemia, with long-term current use of insulin ([place]) 04/20/2019   Uncontrolled diabetes mellitus 02/2019   Ventricular tachycardia ([place]) 01/16/15   sustained VT terminated with ATP, CL 340 msec   Vitamin B 12 deficiency 11/05/2018   Vitamin D deficiency 11/05/2018    Current Outpatient Medications  Medication Sig Dispense Refill   albuterol (PROVENTIL) (2.5 MG/3ML) 0.083% nebulizer solution Take 2.5 mg by nebulization every 6 (six) hours as needed for wheezing or shortness of breath.      Alcohol Swabs (ALCOHOL WIPES) 70 % PADS DropSafe Alcohol Prep Pads  USE WHEN CHECKING BLOOD SUGARS     amiodarone (PACERONE) 200 MG tablet Take 2 tablets ('400mg'$ ) by mouth daily for one week, then reduce to 1 tablet ('200mg'$ ) daily. 37 tablet 5   carvedilol (COREG) 12.5 MG tablet Take 1 tablet (12.5 mg total) by mouth 2 (two) times daily with a meal. 60 tablet 5   Continuous Blood Gluc Receiver (DEXCOM G7 RECEIVER) DEVI 1 Device by Does not apply route as directed. 1 each 0   Continuous Blood Gluc Sensor (DEXCOM G7 SENSOR) MISC 1 Device by Does not apply route as directed. 9 each 3   cyanocobalamin (,VITAMIN B-12,) 1000 MCG/ML injection 1,000 mcg every 30 (thirty) days.     dapagliflozin propanediol (FARXIGA) 10 MG TABS tablet Take 1  tablet (10 mg total) by mouth daily. 90 tablet 3   ELIQUIS 5 MG TABS tablet TAKE 1 TABLET TWICE DAILY 180 tablet 3   ferrous sulfate 325 (65 FE) MG EC tablet Take 325 mg by mouth every other day.     furosemide (LASIX) 40 MG tablet Take 1 tablet (40 mg total) by  mouth daily. 30 tablet 3   gabapentin (NEURONTIN) 300 MG capsule Take 300 mg by mouth at bedtime.     insulin degludec (TRESIBA FLEXTOUCH) 100 UNIT/ML FlexTouch Pen Inject 30 Units into the skin daily. 30 mL 3   Insulin Pen Needle 31G X 5 MM MISC 1 Device by Does not apply route in the morning, at noon, in the evening, and at bedtime. 400 each 3   isosorbide mononitrate (IMDUR) 30 MG 24 hr tablet Take 30 mg by mouth daily.     latanoprost (XALATAN) 0.005 % ophthalmic solution Place 1 drop into both eyes at bedtime.     nitroGLYCERIN (NITROSTAT) 0.4 MG SL tablet Place 0.4 mg under the tongue every 5 (five) minutes as needed for chest pain.     NOVOLOG FLEXPEN 100 UNIT/ML FlexPen INJECT PER SLIDING SCALE AS DIRECTED , MAX 30 UNITS PER DAY. DISCARD PEN 28 DAYS AFTER OPENING (Patient taking differently: Inject 10-15 Units into the skin 3 (three) times daily with meals. Per sliding scale) 30 mL 3   potassium chloride SA (KLOR-CON) 20 MEQ tablet Take 20 mEq by mouth daily as needed (for low pottassium).     rosuvastatin (CRESTOR) 20 MG tablet Take 1 tablet (20 mg total) by mouth daily at 6 PM. 30 tablet 2   sacubitril-valsartan (ENTRESTO) 24-26 MG Take 1 tablet by mouth 2 (two) times daily. 60 tablet 3   spironolactone (ALDACTONE) 25 MG tablet Take 0.5 tablets (12.5 mg total) by mouth daily. 15 tablet 3   TRUE METRIX BLOOD GLUCOSE TEST test strip TEST THREE TIMES DAILY 300 strip 3   Vitamin D, Ergocalciferol, (DRISDOL) 1.25 MG (50000 UT) CAPS capsule Take 1 capsule (50,000 Units total) by mouth every 7 (seven) days. 4 capsule 0   No current facility-administered medications for this encounter.    Allergies  Allergen Reactions   Metformin And  Related Itching, Swelling and Other (See Comments)    Leg pain & swelling in legs   Ozempic (0.25 Or 0.5 Mg-Dose) [Semaglutide(0.25 Or 0.'5mg'$ -Dos)] Other (See Comments)    Pt with hx of pancreatitis    Rosuvastatin    Atorvastatin Calcium Itching and Rash   Levofloxacin Itching      Social History   Socioeconomic History   Marital status: Legally Separated    Spouse name: Not on file   Number of children: 3   Years of education: 17   Highest education level: Not on file  Occupational History   Occupation: retired    Fish farm manager: UNEMPLOYED  Tobacco Use   Smoking status: Never   Smokeless tobacco: Never  Vaping Use   Vaping Use: Never used  Substance and Sexual Activity   Alcohol use: No   Drug use: No   Sexual activity: Not Currently  Other Topics Concern   Not on file  Social History Narrative   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   11/04/15 Lives with son    caffeine - coffee, 1-2 cups daily   Social Determinants of Health   Financial Resource Strain: Low Risk  (01/15/2022)   Overall Financial Resource Strain (CARDIA)    Difficulty of Paying Living Expenses: Not very hard  Food Insecurity: No Food Insecurity (01/12/2022)   Hunger Vital Sign    Worried About Running Out of Food in the Last Year: Never true    Ran Out of Food in the Last Year: Never true  Transportation Needs: No Transportation Needs (01/12/2022)   PRAPARE -  Hydrologist (Medical): No    Lack of Transportation (Non-Medical): No  Physical Activity: Not on file  Stress: Not on file  Social Connections: Not on file  Intimate Partner Violence: Not At Risk (01/12/2022)   Humiliation, Afraid, Rape, and Kick questionnaire    Fear of Current or Ex-Partner: No    Emotionally Abused: No    Physically Abused: No    Sexually Abused: No      Family History  Problem Relation Age of Onset   Diabetes Mother 34       died - HTN   Stroke Mother    High  blood pressure Mother    Sudden death Mother    Obesity Mother    Other Other        No early family hx of CAD    Vitals:   01/26/22 1414  BP: 115/74  Pulse: (!) 54  SpO2: 96%  Weight: 109.3 kg (241 lb)    PHYSICAL EXAM: General:  Well appearing. No respiratory difficulty HEENT: normal Neck: supple. JVD 9-10 cm. Carotids 2+ bilat; no bruits. No lymphadenopathy or thryomegaly appreciated. Cor: PMI nondisplaced. Regular rate & rhythm. No rubs, gallops or murmurs. Lungs: clear Abdomen: soft, nontender, nondistended. No hepatosplenomegaly. No bruits or masses. Good bowel sounds. Extremities: no cyanosis, clubbing, rash, edema Neuro: alert & oriented x 3, cranial nerves grossly intact. moves all 4 extremities w/o difficulty. Affect pleasant.  ECG: Not performed    ASSESSMENT & PLAN:  1. Chronic Systolic Heart Failure - h/o ICM, EF 30% in 2007>>MVCAD s/p CABG - has ICD - EF improved over the years, 40-45% in 2017>>50% in 2021 - EF back down 1/24, 20-25%, Runs of VT. LHC w/ patent grafts. Recent drop in EF not ischemically mediated. ? Viral from recent COVID infection  - NYHA Class III, in setting of mild fluid overload from poor compliance w/ Lasix - increase lasix to 40 mg daily - add spiro 12.5 mg daily  - continue Entresto 24-26 mg bid - continue Farxiga 10 mg  - continue Coreg 12.5 mg bid (HR to low for further titration) - BMP today and again in 7 days    2. VT - recent VT 1/24  - LHC w/ patent grafts - has ICD - continue amiodarone per EP (has clinic f/u next wk)  - check labs today    3. CAD  - h/o CABG in 2007 - no recent CP, LHC 1/24 w/ patent grafts - continue medical therapy   4. OSA - reports compliance w/ CPAP but notes daytime fatigue/somnolence and falls asleep easily, may need retitration of device/ repeat sleep study. Advise she d/w Dr. Marlou Porch next visit.      Referred to HFSW (PCP, Medications, Transportation, ETOH Abuse, Drug Abuse,  Insurance, Financial ): No  Refer to Pharmacy: No  Refer to Home Health: No  Refer to Advanced Heart Failure Clinic: No  Refer to General Cardiology: Yes  Follow up  w/ Dr. Quentin Ore next week and Dr. Marlou Porch in 4 wks

## 2022-01-26 NOTE — Patient Instructions (Addendum)
Medication Changes:  CHANGE: amiodarone '200mg'$ . Take ONE tablet, once a day.   CONTINUE: lasix '40mg'$ . Take one tablet, once a day, EVERYDAY.  START: Spironolcatone 12.'5mg'$ . Take 0.5 tablet, once a day.   Lab Work:  Labs done today, your results will be available in [place], we will contact you for abnormal readings  Special Instructions // Education:  Do the following things EVERYDAY: Weigh yourself in the morning before breakfast. Write it down and keep it in a log. Take your medicines as prescribed Eat low salt foods--Limit salt (sodium) to 2000 mg per day.  Stay as active as you can everyday Limit all fluids for the day to less than 2 liters  Follow-Up in: with previously scheduled cardiology.

## 2022-01-29 ENCOUNTER — Ambulatory Visit: Payer: Medicare HMO | Attending: Cardiology

## 2022-01-29 DIAGNOSIS — Z95810 Presence of automatic (implantable) cardiac defibrillator: Secondary | ICD-10-CM

## 2022-01-29 DIAGNOSIS — I5042 Chronic combined systolic (congestive) and diastolic (congestive) heart failure: Secondary | ICD-10-CM

## 2022-01-30 ENCOUNTER — Telehealth: Payer: Self-pay | Admitting: Cardiology

## 2022-01-30 NOTE — Telephone Encounter (Signed)
Returned call to Pt.  Advised that [name] had not reviewed her most recent transmission yet.  Pt then states she has an office visit with Dr. Quentin Ore tomorrow 01/31/22.    Advised Dr. Quentin Ore would review her remotes and discuss results with her tomorrow.  She indicates understanding.

## 2022-01-30 NOTE — Telephone Encounter (Signed)
Patient calling to request her transmission results from last month and this month.

## 2022-01-31 ENCOUNTER — Ambulatory Visit: Payer: Medicare HMO | Attending: Cardiology

## 2022-01-31 ENCOUNTER — Telehealth: Payer: Self-pay

## 2022-01-31 ENCOUNTER — Ambulatory Visit: Payer: Medicare HMO | Attending: Cardiology | Admitting: Cardiology

## 2022-01-31 ENCOUNTER — Encounter: Payer: Self-pay | Admitting: Cardiology

## 2022-01-31 VITALS — BP 128/74 | HR 50 | Ht 60.0 in | Wt 222.0 lb

## 2022-01-31 DIAGNOSIS — I519 Heart disease, unspecified: Secondary | ICD-10-CM

## 2022-01-31 DIAGNOSIS — I4901 Ventricular fibrillation: Secondary | ICD-10-CM | POA: Diagnosis not present

## 2022-01-31 DIAGNOSIS — I82402 Acute embolism and thrombosis of unspecified deep veins of left lower extremity: Secondary | ICD-10-CM

## 2022-01-31 DIAGNOSIS — Z79899 Other long term (current) drug therapy: Secondary | ICD-10-CM | POA: Diagnosis not present

## 2022-01-31 DIAGNOSIS — Z95810 Presence of automatic (implantable) cardiac defibrillator: Secondary | ICD-10-CM

## 2022-01-31 MED ORDER — SPIRONOLACTONE 25 MG PO TABS: 12.5000 mg | ORAL_TABLET | Freq: Every day | ORAL | 3 refills | Status: DC

## 2022-01-31 NOTE — Progress Notes (Signed)
Electrophysiology Office Follow up Visit Note:    Date:  01/31/2022   ID:  [name], DOB 12/14/1950, MRN [id number]  PCP:  Everardo Beals, NP  [facility] Cardiologist:  Candee Furbish, MD  [facility] Electrophysiologist:  Vickie Epley, MD    Interval History:    [name] is a 72 y.o. female who presents for a follow up visit.  She was hospitalized earlier this month after ICD therapy for ventricular fibrillation.  She had been sick with nausea vomiting and diarrhea prior to the high-voltage therapy.  She was started on amiodarone during the hospitalization.  Heart cath on January 15, 2022 showed no significant progression in coronary disease.  Today, she is accompanied by a family member. She states she is feeling pretty good.  Currently she is taking 2 tablets of 200 mg amiodarone once daily. Additionally she states that she was started on spironolactone, but has not yet received it from mail delivery..  She denies any palpitations, chest pain, shortness of breath, or peripheral edema. No lightheadedness, headaches, syncope, orthopnea, or PND.  She is with family today in clinic.  She is tolerating amiodarone.      Past Medical History:  Diagnosis Date   Abnormal LFTs    AICD (automatic cardioverter/defibrillator) present    Angina decubitus 11/06/2012   Angina pectoris ([employer])    Anxiety    Arthritis    Back pain    Bundle branch block 05/2016   CAD (coronary artery disease) 06/18/2010   CHF (congestive heart failure) (HCC)    Chronic combined systolic and diastolic CHF, NYHA class 3 (HCC)    Chronic combined systolic and diastolic heart failure ([place])    Chronic systolic dysfunction of left ventricle 09/24/2012   Coronary artery disease    s/p CABG 2007   Depression    Dermal mycosis    Diabetes mellitus    type II   DKA (diabetic ketoacidoses) 01/12/2019   DVT (deep venous thrombosis) ([place]) 09/2015   bilateral   Edema, lower extremity     Glaucoma    Hyperlipidemia    Hypertension    Hypoglycemia 09/09/2012   Insomnia    Insulin dependent diabetes mellitus ([place]) 04/23/2012   Ischemic cardiomyopathy    EF 30-35%, s/p ICD 4/08 by Dr Leonia Reeves   Joint pain    LBBB (left bundle branch block) 09/24/2012   Morbid obesity ([place]) 11/06/2012   Obesity    Oxygen dependent 2 L   Pain in left knee    Peripheral neuropathy    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   LIMA to D2, SVG to ramus intermediate, sequential SVG to OM1-OM2, SVG to RCA with EVH via both legs    Sleep apnea    uses O2 at night   Tinea    Type 2 diabetes mellitus with hyperglycemia, with long-term current use of insulin ([place]) 04/20/2019   Uncontrolled diabetes mellitus 02/2019   Ventricular tachycardia ([place]) 01/16/15   sustained VT terminated with ATP, CL 340 msec   Vitamin B 12 deficiency 11/05/2018   Vitamin D deficiency 11/05/2018    Past Surgical History:  Procedure Laterality Date   BI-VENTRICULAR IMPLANTABLE CARDIOVERTER DEFIBRILLATOR UPGRADE N/A 09/25/2012   Procedure: BI-VENTRICULAR IMPLANTABLE CARDIOVERTER DEFIBRILLATOR UPGRADE;  Surgeon: Coralyn Mark, MD;  Location: [facility] CATH LAB;  Service: Cardiovascular;  Laterality: N/A;   BREAST BIOPSY Right 04/05/2014   CARDIAC CATHETERIZATION     CARDIAC DEFIBRILLATOR PLACEMENT  4/08   by Dr  Edmunds (MDT)   COLONOSCOPY WITH PROPOFOL N/A 10/12/2016   Procedure: COLONOSCOPY WITH PROPOFOL;  Surgeon: Doran Stabler, MD;  Location: [facility];  Service: Gastroenterology;  Laterality: N/A;   CORONARY ARTERY BYPASS GRAFT  10/05/2005   by Dr Cyndia Bent   IMPLANTABLE CARDIOVERTER DEFIBRILLATOR IMPLANT  09/25/12   attempt of upgrade to CRT-D unsuccessful due to CS anatomy, SJM Unify Asaura device placed with LV port capped by Dr Rayann Heman   IR GENERIC HISTORICAL  10/03/2015   IR US GUIDE VASC ACCESS LEFT 10/03/2015 Jacqulynn Cadet, MD [facility]   IR GENERIC HISTORICAL  10/03/2015   IR VENO/EXT/UNI LEFT 10/03/2015 Jacqulynn Cadet, MD  [facility]   IR GENERIC HISTORICAL  10/03/2015   IR VENOCAVAGRAM IVC 10/03/2015 Jacqulynn Cadet, MD [facility]   IR GENERIC HISTORICAL  10/03/2015   IR INFUSION THROMBOL VENOUS INITIAL (MS) 10/03/2015 Jacqulynn Cadet, MD [facility]   IR GENERIC HISTORICAL  10/03/2015   IR US GUIDE VASC ACCESS LEFT 10/03/2015 Jacqulynn Cadet, MD [facility]   IR GENERIC HISTORICAL  10/04/2015   IR THROMBECT VENO MECH MOD SED 10/04/2015 Sandi Mariscal, MD [facility]   IR GENERIC HISTORICAL  10/04/2015   IR TRANSCATH PLC STENT 1ST ART NOT LE CV CAR VERT CAR 10/04/2015 Sandi Mariscal, MD [facility]   IR GENERIC HISTORICAL  10/04/2015   IR THROMB F/U EVAL ART/VEN FINAL DAY (MS) 10/04/2015 Sandi Mariscal, MD [facility]   IR GENERIC HISTORICAL  11/02/2015   IR RADIOLOGIST EVAL & MGMT 11/02/2015 Sandi Mariscal, MD [facility]   IR RADIOLOGIST EVAL & MGMT  04/26/2016   LEFT HEART CATH AND CORS/GRAFTS ANGIOGRAPHY N/A 01/19/2019   Procedure: LEFT HEART CATH AND CORS/GRAFTS ANGIOGRAPHY;  Surgeon: Belva Crome, MD;  Location: [place] [facility];  Service: Cardiovascular;  Laterality: N/A;   LEFT HEART CATH AND CORS/GRAFTS ANGIOGRAPHY N/A 01/15/2022   Procedure: LEFT HEART CATH AND CORS/GRAFTS ANGIOGRAPHY;  Surgeon: Lorretta Harp, MD;  Location: [place] [facility] LAB;  Service: Cardiovascular;  Laterality: N/A;    Current Medications: Current Meds  Medication Sig   albuterol (PROVENTIL) (2.5 MG/3ML) 0.083% nebulizer solution Take 2.5 mg by nebulization every 6 (six) hours as needed for wheezing or shortness of breath.    Alcohol Swabs (ALCOHOL WIPES) 70 % PADS DropSafe Alcohol Prep Pads  USE WHEN CHECKING BLOOD SUGARS   amiodarone (PACERONE) 200 MG tablet Take 2 tablets ('400mg'$ ) by mouth daily for one week, then reduce to 1 tablet ('200mg'$ ) daily.   amitriptyline (ELAVIL) 10 MG tablet Take 10 mg by mouth at bedtime.   carvedilol (COREG) 12.5 MG tablet Take 1 tablet (12.5 mg total) by mouth 2 (two) times daily with a  meal.   Continuous Blood Gluc Receiver (DEXCOM G7 RECEIVER) DEVI 1 Device by Does not apply route as directed.   Continuous Blood Gluc Sensor (DEXCOM G7 SENSOR) MISC 1 Device by Does not apply route as directed.   cyanocobalamin (,VITAMIN B-12,) 1000 MCG/ML injection 1,000 mcg every 30 (thirty) days.   dapagliflozin propanediol (FARXIGA) 10 MG TABS tablet Take 1 tablet (10 mg total) by mouth daily.   ELIQUIS 5 MG TABS tablet TAKE 1 TABLET TWICE DAILY   ferrous sulfate 325 (65 FE) MG EC tablet Take 325 mg by mouth every other day.   furosemide (LASIX) 40 MG tablet Take 1 tablet (40 mg total) by mouth daily.   gabapentin (NEURONTIN) 300 MG capsule Take 300 mg by mouth at bedtime.  insulin degludec (TRESIBA FLEXTOUCH) 100 UNIT/ML FlexTouch Pen Inject 30 Units into the skin daily.   Insulin Pen Needle 31G X 5 MM MISC 1 Device by Does not apply route in the morning, at noon, in the evening, and at bedtime.   isosorbide mononitrate (IMDUR) 30 MG 24 hr tablet Take 30 mg by mouth daily.   latanoprost (XALATAN) 0.005 % ophthalmic solution Place 1 drop into both eyes at bedtime.   nitroGLYCERIN (NITROSTAT) 0.4 MG SL tablet Place 1 tablet (0.4 mg total) under the tongue every 5 (five) minutes as needed for chest pain.   NOVOLOG FLEXPEN 100 UNIT/ML FlexPen INJECT PER SLIDING SCALE AS DIRECTED , MAX 30 UNITS PER DAY. DISCARD PEN 28 DAYS AFTER OPENING (Patient taking differently: Inject 10-15 Units into the skin 3 (three) times daily with meals. Per sliding scale)   potassium chloride SA (KLOR-CON) 20 MEQ tablet Take 20 mEq by mouth daily as needed (for low pottassium).   rosuvastatin (CRESTOR) 20 MG tablet Take 1 tablet (20 mg total) by mouth daily at 6 PM.   sacubitril-valsartan (ENTRESTO) 24-26 MG Take 1 tablet by mouth 2 (two) times daily.   TRUE METRIX BLOOD GLUCOSE TEST test strip TEST THREE TIMES DAILY   Vitamin D, Ergocalciferol, (DRISDOL) 1.25 MG (50000 UT) CAPS capsule Take 1 capsule (50,000 Units  total) by mouth every 7 (seven) days.   [DISCONTINUED] spironolactone (ALDACTONE) 25 MG tablet Take 0.5 tablets (12.5 mg total) by mouth daily.     Allergies:   Metformin and related, Ozempic (0.25 or 0.5 mg-dose) [semaglutide(0.25 or 0.'5mg'$ -dos)], Rosuvastatin, Atorvastatin calcium, and Levofloxacin   Social History   Socioeconomic History   Marital status: Legally Separated    Spouse name: Not on file   Number of children: 3   Years of education: 17   Highest education level: Not on file  Occupational History   Occupation: retired    Fish farm manager: UNEMPLOYED  Tobacco Use   Smoking status: Never   Smokeless tobacco: Never  Vaping Use   Vaping Use: Never used  Substance and Sexual Activity   Alcohol use: No   Drug use: No   Sexual activity: Not Currently  Other Topics Concern   Not on file  Social History Narrative   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   11/04/15 Lives with son    caffeine - coffee, 1-2 cups daily   Social Determinants of Health   Financial Resource Strain: Low Risk  (01/15/2022)   Overall Financial Resource Strain (CARDIA)    Difficulty of Paying Living Expenses: Not very hard  Food Insecurity: No Food Insecurity (01/12/2022)   Hunger Vital Sign    Worried About Running Out of Food in the Last Year: Never true    Ran Out of Food in the Last Year: Never true  Transportation Needs: No Transportation Needs (01/12/2022)   PRAPARE - Hydrologist (Medical): No    Lack of Transportation (Non-Medical): No  Physical Activity: Not on file  Stress: Not on file  Social Connections: Not on file     Family History: The patient's family history includes Diabetes (age of onset: 56) in her mother; High blood pressure in her mother; Obesity in her mother; Other in an other family member; Stroke in her mother; Sudden death in her mother.  ROS:   Please see the history of present illness.    All other systems  reviewed and are negative.  EKGs/Labs/Other Studies  Reviewed:    The following studies were reviewed today:  January 31, 2022 in clinic device interrogation personally reviewed Battery longevity 10.4 months Lead parameter stable 19% atrial pacing Less than 1% RV pacing 0% A-fib burden No further high-voltage therapies since last hospitalization  EKG:  The ekg ordered today demonstrates a paced, V sense.  PR interval 270 ms.  Recent Labs: 10/29/2021: ALT 21 01/17/2022: Hemoglobin 12.9; Magnesium 2.1; Platelets 138 01/26/2022: B Natriuretic Peptide 86.9; BUN 15; Creatinine, Ser 1.07; Potassium 4.1; Sodium 140   Recent Lipid Panel    Component Value Date/Time   CHOL 125 04/28/2021 1003   CHOL 111 11/03/2018 1205   CHOL 90 12/22/2012 1042   TRIG 78.0 04/28/2021 1003   TRIG 85 12/22/2012 1042   HDL 44.20 04/28/2021 1003   HDL 30 (L) 11/03/2018 1205   HDL 33 (L) 12/22/2012 1042   CHOLHDL 3 04/28/2021 1003   VLDL 15.6 04/28/2021 1003   LDLCALC 65 04/28/2021 1003   LDLCALC 64 11/03/2018 1205   LDLCALC 40 12/22/2012 1042    Physical Exam:    VS:  BP 128/74   Pulse (!) 50   Ht 5' (1.524 m)   Wt 222 lb (100.7 kg)   SpO2 95%   BMI 43.36 kg/m     Wt Readings from Last 3 Encounters:  01/31/22 222 lb (100.7 kg)  01/26/22 241 lb (109.3 kg)  01/17/22 231 lb 4.8 oz (104.9 kg)     GEN:  Well nourished, well developed in no acute distress CARDIAC: RRR, no murmurs, rubs, gallops.  ICD pocket well healed. PSYCHIATRIC:  Normal affect        ASSESSMENT:    1. Ventricular fibrillation ([place])   2. Chronic systolic dysfunction of left ventricle   3. ICD (implantable cardioverter-defibrillator) in place   4. Encounter for long-term (current) use of high-risk medication   5. Deep vein thrombosis (DVT) of left lower extremity, unspecified chronicity, unspecified vein (HCC)    PLAN:    In order of problems listed above:  #Ventricular fibrillation #ICD in situ Device  functioning appropriately.  Continue remote monitoring. No recurrent high-voltage therapies since hospital discharge.  Continue amiodarone. Update CMP, TSH and free T4 today. Continue Coreg, Farxiga, Lasix, Entresto and spironolactone   #Chronic systolic heart failure NYHA class II.  Warm and dry on exam.  #History of DVT On Eliquis.    Follow-up 6 months with APP.  Medication Adjustments/Labs and Tests Ordered: Current medicines are reviewed at length with the patient today.  Concerns regarding medicines are outlined above.   Orders Placed This Encounter  Procedures   Comprehensive metabolic panel   TSH   T4, free   EKG 12-Lead   Meds ordered this encounter  Medications   spironolactone (ALDACTONE) 25 MG tablet    Sig: Take 0.5 tablets (12.5 mg total) by mouth daily.    Dispense:  45 tablet    Refill:  3   I,Mathew Stumpf,acting as a scribe for Vickie Epley, MD.,have documented all relevant documentation on the behalf of Vickie Epley, MD,as directed by  Vickie Epley, MD while in the presence of Vickie Epley, MD.  I, Vickie Epley, MD, have reviewed all documentation for this visit. The documentation on 01/31/22 for the exam, diagnosis, procedures, and orders are all accurate and complete.    Signed, Lars Mage, MD, [facility], [facility] 01/31/2022 2:42 PM    Electrophysiology [facility]

## 2022-01-31 NOTE — Telephone Encounter (Signed)
Remote ICM transmission received.  Attempted call to patient regarding ICM remote transmission and left detailed message per DPR.  Advised to return call for any fluid symptoms or questions. Next ICM remote transmission scheduled 03/05/2022.    

## 2022-01-31 NOTE — Progress Notes (Deleted)
Electrophysiology Office Follow up Visit Note:    Date:  01/31/2022   ID:  [name], DOB 1950-04-05, MRN [id number]  PCP:  Everardo Beals, NP  [facility] Cardiologist:  Candee Furbish, MD  [facility] Electrophysiologist:  Vickie Epley, MD    Interval History:    [name] is a 72 y.o. female who presents for a follow up visit.  She was hospitalized earlier this month after ICD therapy for ventricular fibrillation.  She had been sick with nausea vomiting and diarrhea prior to the high-voltage therapy.  She was started on amiodarone during the hospitalization.  Heart cath on January 15, 2022 showed no significant progression in coronary disease.       Past Medical History:  Diagnosis Date   Abnormal LFTs    AICD (automatic cardioverter/defibrillator) present    Angina decubitus 11/06/2012   Angina pectoris ([place])    Anxiety    Arthritis    Back pain    Bundle branch block 05/2016   CAD (coronary artery disease) 06/18/2010   CHF (congestive heart failure) (HCC)    Chronic combined systolic and diastolic CHF, NYHA class 3 (HCC)    Chronic combined systolic and diastolic heart failure ([place])    Chronic systolic dysfunction of left ventricle 09/24/2012   Coronary artery disease    s/p CABG 2007   Depression    Dermal mycosis    Diabetes mellitus    type II   DKA (diabetic ketoacidoses) 01/12/2019   DVT (deep venous thrombosis) ([place]) 09/2015   bilateral   Edema, lower extremity    Glaucoma    Hyperlipidemia    Hypertension    Hypoglycemia 09/09/2012   Insomnia    Insulin dependent diabetes mellitus ([place]) 04/23/2012   Ischemic cardiomyopathy    EF 30-35%, s/p ICD 4/08 by Dr Leonia Reeves   Joint pain    LBBB (left bundle branch block) 09/24/2012   Morbid obesity ([place]) 11/06/2012   Obesity    Oxygen dependent 2 L   Pain in left knee    Peripheral neuropathy    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   LIMA to D2, SVG to ramus intermediate, sequential SVG to OM1-OM2, SVG  to RCA with EVH via both legs    Sleep apnea    uses O2 at night   Tinea    Type 2 diabetes mellitus with hyperglycemia, with long-term current use of insulin ([place]) 04/20/2019   Uncontrolled diabetes mellitus 02/2019   Ventricular tachycardia ([place]) 01/16/15   sustained VT terminated with ATP, CL 340 msec   Vitamin B 12 deficiency 11/05/2018   Vitamin D deficiency 11/05/2018    Past Surgical History:  Procedure Laterality Date   BI-VENTRICULAR IMPLANTABLE CARDIOVERTER DEFIBRILLATOR UPGRADE N/A 09/25/2012   Procedure: BI-VENTRICULAR IMPLANTABLE CARDIOVERTER DEFIBRILLATOR UPGRADE;  Surgeon: Coralyn Mark, MD;  Location: [facility] CATH LAB;  Service: Cardiovascular;  Laterality: N/A;   BREAST BIOPSY Right 04/05/2014   CARDIAC CATHETERIZATION     CARDIAC DEFIBRILLATOR PLACEMENT  4/08   by Dr Leonia Reeves (MDT)   COLONOSCOPY WITH PROPOFOL N/A 10/12/2016   Procedure: COLONOSCOPY WITH PROPOFOL;  Surgeon: Doran Stabler, MD;  [facility];  Service: Gastroenterology;  Laterality: N/A;   CORONARY ARTERY BYPASS GRAFT  10/05/2005   by Dr Cyndia Bent   IMPLANTABLE CARDIOVERTER DEFIBRILLATOR IMPLANT  09/25/12   attempt of upgrade to CRT-D unsuccessful due to CS anatomy, SJM Unify Asaura device placed with LV port capped by Dr Rayann Heman  IR GENERIC HISTORICAL  10/03/2015   IR US GUIDE VASC ACCESS LEFT 10/03/2015 Jacqulynn Cadet, MD [facility]   IR GENERIC HISTORICAL  10/03/2015   IR VENO/EXT/UNI LEFT 10/03/2015 Jacqulynn Cadet, MD [facility]   IR GENERIC HISTORICAL  10/03/2015   IR VENOCAVAGRAM IVC 10/03/2015 Jacqulynn Cadet, MD [facility]   IR GENERIC HISTORICAL  10/03/2015   IR INFUSION THROMBOL VENOUS INITIAL (MS) 10/03/2015 Jacqulynn Cadet, MD [facility]   IR GENERIC HISTORICAL  10/03/2015   IR US GUIDE VASC ACCESS LEFT 10/03/2015 Jacqulynn Cadet, MD [facility]   IR GENERIC HISTORICAL  10/04/2015   IR THROMBECT VENO MECH MOD SED 10/04/2015 Sandi Mariscal, MD [facility]   IR GENERIC HISTORICAL   10/04/2015   IR TRANSCATH PLC STENT 1ST ART NOT LE CV CAR VERT CAR 10/04/2015 Sandi Mariscal, MD [facility]   IR GENERIC HISTORICAL  10/04/2015   IR THROMB F/U EVAL ART/VEN FINAL DAY (MS) 10/04/2015 Sandi Mariscal, MD [facility]   IR GENERIC HISTORICAL  11/02/2015   IR RADIOLOGIST EVAL & MGMT 11/02/2015 Sandi Mariscal, MD [facility]   IR RADIOLOGIST EVAL & MGMT  04/26/2016   LEFT HEART CATH AND CORS/GRAFTS ANGIOGRAPHY N/A 01/19/2019   Procedure: LEFT HEART CATH AND CORS/GRAFTS ANGIOGRAPHY;  Surgeon: Belva Crome, MD;  Location: [place] [facility];  Service: Cardiovascular;  Laterality: N/A;   LEFT HEART CATH AND CORS/GRAFTS ANGIOGRAPHY N/A 01/15/2022   Procedure: LEFT HEART CATH AND CORS/GRAFTS ANGIOGRAPHY;  Surgeon: Lorretta Harp, MD;  Location: [place] [facility] LAB;  Service: Cardiovascular;  Laterality: N/A;    Current Medications: No outpatient medications have been marked as taking for the 01/31/22 encounter (Appointment) with Vickie Epley, MD.     Allergies:   Metformin and related, Ozempic (0.25 or 0.5 mg-dose) [semaglutide(0.25 or 0.'5mg'$ -dos)], Rosuvastatin, Atorvastatin calcium, and Levofloxacin   Social History   Socioeconomic History   Marital status: Legally Separated    Spouse name: Not on file   Number of children: 3   Years of education: 17   Highest education level: Not on file  Occupational History   Occupation: retired    Fish farm manager: UNEMPLOYED  Tobacco Use   Smoking status: Never   Smokeless tobacco: Never  Vaping Use   Vaping Use: Never used  Substance and Sexual Activity   Alcohol use: No   Drug use: No   Sexual activity: Not Currently  Other Topics Concern   Not on file  Social History Narrative   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   11/04/15 Lives with son    caffeine - coffee, 1-2 cups daily   Social Determinants of Health   Financial Resource Strain: Low Risk  (01/15/2022)   Overall Financial Resource Strain  (CARDIA)    Difficulty of Paying Living Expenses: Not very hard  Food Insecurity: No Food Insecurity (01/12/2022)   Hunger Vital Sign    Worried About Running Out of Food in the Last Year: Never true    Ran Out of Food in the Last Year: Never true  Transportation Needs: No Transportation Needs (01/12/2022)   PRAPARE - Hydrologist (Medical): No    Lack of Transportation (Non-Medical): No  Physical Activity: Not on file  Stress: Not on file  Social Connections: Not on file     Family History: The patient's family history includes Diabetes (age of onset: 1) in her mother; High blood pressure in her mother;  Obesity in her mother; Other in an other family member; Stroke in her mother; Sudden death in her mother.  ROS:   Please see the history of present illness.    All other systems reviewed and are negative.  EKGs/Labs/Other Studies Reviewed:    The following studies were reviewed today:  January 31, 2022 in clinic device interrogation personally reviewed ***  EKG:  The ekg ordered today demonstrates ***  Recent Labs: 10/29/2021: ALT 21 01/17/2022: Hemoglobin 12.9; Magnesium 2.1; Platelets 138 01/26/2022: B Natriuretic Peptide 86.9; BUN 15; Creatinine, Ser 1.07; Potassium 4.1; Sodium 140  Recent Lipid Panel    Component Value Date/Time   CHOL 125 04/28/2021 1003   CHOL 111 11/03/2018 1205   CHOL 90 12/22/2012 1042   TRIG 78.0 04/28/2021 1003   TRIG 85 12/22/2012 1042   HDL 44.20 04/28/2021 1003   HDL 30 (L) 11/03/2018 1205   HDL 33 (L) 12/22/2012 1042   CHOLHDL 3 04/28/2021 1003   VLDL 15.6 04/28/2021 1003   LDLCALC 65 04/28/2021 1003   LDLCALC 64 11/03/2018 1205   LDLCALC 40 12/22/2012 1042    Physical Exam:    VS:  There were no vitals taken for this visit.    Wt Readings from Last 3 Encounters:  01/26/22 241 lb (109.3 kg)  01/17/22 231 lb 4.8 oz (104.9 kg)  11/03/21 232 lb (105.2 kg)     GEN: *** Well nourished, well developed in  no acute distress CARDIAC: ***RRR, no murmurs, rubs, gallops PSYCHIATRIC:  Normal affect        ASSESSMENT:    1. Ventricular fibrillation ([place])   2. Chronic systolic dysfunction of left ventricle   3. ICD (implantable cardioverter-defibrillator) in place    PLAN:    In order of problems listed above:  #Ventricular fibrillation #ICD in situ Device functioning appropriately.  Continue remote monitoring. No recurrent high-voltage therapies since hospital discharge.  Continue amiodarone. Update CMP, TSH and free T4 today. Continue Coreg, Farxiga, Lasix, Entresto and spironolactone   #Chronic systolic heart failure NYHA class II.  Warm and dry on exam.  #History of DVT On Eliquis.       Medication Adjustments/Labs and Tests Ordered: Current medicines are reviewed at length with the patient today.  Concerns regarding medicines are outlined above.  No orders of the defined types were placed in this encounter.  No orders of the defined types were placed in this encounter.    Signed, Lars Mage, MD, [facility], [facility] 01/31/2022 5:32 AM    Electrophysiology [facility]

## 2022-01-31 NOTE — Progress Notes (Signed)
EPIC Encounter for ICM Monitoring  Patient Name: [name] is a 72 y.o. female Date: 01/31/2022 Primary Care Physican: Everardo Beals, NP Electrophysiologist: Quentin Ore 04/18/2021 Weight: 226 lbs 05/22/2021 Office Weight: 245 lbs 07/26/2021 Weight: 240 lbs 10/04/2021 Weight: 237 lbs 11/06/2021 Weight: 232 lbs   AT/AF Burden: 0% (taking Eliquis)                                                          Attempted call to patient and unable to reach.  Left detailed message per DPR regarding transmission. Transmission reviewed. Hospitalization 1/5-1/10 due to device shock    CorVue thoracic impedance suggesting normal fluid levels.   Prescribed: Furosemide 40 mg take 1 tablet daily Potassium 20 mEq take 1 tablet daily   Labs: 01/26/2022 Creatinine 1.07, BUN 15, Potassium 4.1, Sodium 140, GFR 56  01/17/2022 Creatinine 0.99, BUN 18, Potassium 4.1, Sodium 135, GFR >60  01/16/2022 Creatinine 0.88, BUN 19, Potassium 3.9, Sodium 135, GFR >60  01/15/2022 Creatinine 1.04, BUN 20, Potassium 4.3, Sodium 136, GFR 57 01/14/2022 Creatinine 1.09, BUN 22, Potassium 3.8, Sodium 135, GFR 54  01/13/2022 Creatinine 1.09, BUN 21, Potassium 3.4, Sodium 137, GFR 54  01/12/2022 Creatinine 1.13, BUN 18, Potassium 3.7, Sodium 137, GFR 52  A complete set of results can be found in Results Review.   Recommendations: Left voice mail with ICM number and encouraged to call if experiencing any fluid symptoms.   Next ICM clinic phone appointment due: 03/05/2022.     Next 91 day remote transmission due: 04/23/2022.     EP/Cardiology Office Visits:  08/08/2022 with Dr Quentin Ore.  03/06/2022 with Dr Marlou Porch.   Copy of ICM check sent to Dr. Quentin Ore.   3 month ICM trend: 01/29/2022.    12-14 Month ICM trend:     [name], RN 01/31/2022 4:16 PM

## 2022-01-31 NOTE — Patient Instructions (Signed)
Medication Instructions:  Your physician recommends that you continue on your current medications as directed. Please refer to the Current Medication list given to you today.  Please make sure you are taking amiodarone 200 mg once daily   *If you need a refill on your cardiac medications before your next appointment, please call your pharmacy*  Lab Work: TODAY: CMET, TSH, T4 If you have labs (blood work) drawn today and your tests are completely normal, you will receive your results only by: [place] (if you have MyChart) OR A paper copy in the mail If you have any lab test that is abnormal or we need to change your treatment, we will call you to review the results.  Follow-Up: At [facility], you and your health needs are our priority.  As part of our continuing mission to provide you with exceptional heart care, we have created designated Provider Care Teams.  These Care Teams include your primary Cardiologist (physician) and Advanced Practice Providers (APPs -  Physician Assistants and Nurse Practitioners) who all work together to provide you with the care you need, when you need it.  Your next appointment:   6 month(s)  Provider:   You may see Vickie Epley, MD or one of the following Advanced Practice Providers on your designated Care Team:   Tommye Standard, [place] Beryle Beams" [place], [place], NP

## 2022-02-01 LAB — COMPREHENSIVE METABOLIC PANEL
ALT: 19 IU/L (ref 0–32)
AST: 16 IU/L (ref 0–40)
Albumin/Globulin Ratio: 1.3 (ref 1.2–2.2)
Albumin: 4.1 g/dL (ref 3.8–4.8)
Alkaline Phosphatase: 95 IU/L (ref 44–121)
BUN/Creatinine Ratio: 20 (ref 12–28)
BUN: 23 mg/dL (ref 8–27)
Bilirubin Total: 0.3 mg/dL (ref 0.0–1.2)
CO2: 18 mmol/L — ABNORMAL LOW (ref 20–29)
Calcium: 9.8 mg/dL (ref 8.7–10.3)
Chloride: 106 mmol/L (ref 96–106)
Creatinine, Ser: 1.15 mg/dL — ABNORMAL HIGH (ref 0.57–1.00)
Globulin, Total: 3.1 g/dL (ref 1.5–4.5)
Glucose: 212 mg/dL — ABNORMAL HIGH (ref 70–99)
Potassium: 3.9 mmol/L (ref 3.5–5.2)
Sodium: 146 mmol/L — ABNORMAL HIGH (ref 134–144)
Total Protein: 7.2 g/dL (ref 6.0–8.5)
eGFR: 51 mL/min/{1.73_m2} — ABNORMAL LOW (ref >59)

## 2022-02-01 LAB — T4, FREE: Free T4: 1.46 ng/dL (ref 0.82–1.77)

## 2022-02-01 LAB — TSH: TSH: 2.06 u[IU]/mL (ref 0.450–4.500)

## 2022-02-06 ENCOUNTER — Encounter: Payer: Self-pay | Admitting: Cardiology

## 2022-02-07 ENCOUNTER — Other Ambulatory Visit: Payer: Self-pay

## 2022-02-07 MED ORDER — AMIODARONE HCL 200 MG PO TABS: 200.0000 mg | ORAL_TABLET | Freq: Every day | ORAL | 5 refills | Status: DC

## 2022-02-07 MED ORDER — CARVEDILOL 12.5 MG PO TABS: 12.5000 mg | ORAL_TABLET | Freq: Two times a day (BID) | ORAL | 3 refills | Status: DC

## 2022-02-07 MED ORDER — ENTRESTO 24-26 MG PO TABS: 1.0000 | ORAL_TABLET | Freq: Two times a day (BID) | ORAL | 3 refills | Status: DC

## 2022-02-22 ENCOUNTER — Other Ambulatory Visit: Payer: Self-pay

## 2022-02-22 MED ORDER — ACCU-CHEK GUIDE VI STRP: ORAL_STRIP | 12 refills | Status: AC

## 2022-02-28 ENCOUNTER — Ambulatory Visit (INDEPENDENT_AMBULATORY_CARE_PROVIDER_SITE_OTHER): Payer: Medicare HMO | Admitting: Podiatry

## 2022-02-28 VITALS — BP 110/52

## 2022-02-28 DIAGNOSIS — M79674 Pain in right toe(s): Secondary | ICD-10-CM

## 2022-02-28 DIAGNOSIS — B351 Tinea unguium: Secondary | ICD-10-CM

## 2022-02-28 DIAGNOSIS — E1151 Type 2 diabetes mellitus with diabetic peripheral angiopathy without gangrene: Secondary | ICD-10-CM

## 2022-02-28 DIAGNOSIS — M79675 Pain in left toe(s): Secondary | ICD-10-CM | POA: Diagnosis not present

## 2022-02-28 NOTE — Progress Notes (Signed)
  Subjective:  Patient ID: [name], female    DOB: 1950/10/18,  MRN: [id number]  [name] presents to clinic today for at risk foot care. Pt has h/o NIDDM with PAD and painful thick toenails that are difficult to trim. Pain interferes with ambulation. Aggravating factors include wearing enclosed shoe gear. Pain is relieved with periodic professional debridement.  Chief Complaint  Patient presents with   Nail Problem    [facility] BS-112 A1C-7.7 PCP-[name] PCP VST-2023    New problem(s): None.   PCP is Jamie Beals, NP.  Allergies  Allergen Reactions   Metformin And Related Itching, Swelling and Other (See Comments)    Leg pain & swelling in legs   Ozempic (0.25 Or 0.5 Mg-Dose) [Semaglutide(0.25 Or 0.25m-Dos)] Other (See Comments)    Pt with hx of pancreatitis    Rosuvastatin    Atorvastatin Calcium Itching and Rash   Levofloxacin Itching    Review of Systems: Negative except as noted in the HPI.  Objective:   Vitals:   02/28/22 1421  BP: (!) 110/52   Jamie LTelesha Julsonis a pleasant 72y.o. female obese in NAD. AAO x 3.  Vascular Examination: CFT <4 seconds b/l. DP pulses diminished b/l. PT pulses diminished b/l. Digital hair absent. Skin temperature gradient warm to cool b/l. No ischemia or gangrene. No cyanosis or clubbing noted b/l. Trace edema noted BLE.   Neurological Examination: Pt has subjective symptoms of neuropathy. Protective sensation decreased with 10 gram monofilament b/l. Vibratory sensation intact b/l.  Dermatological Examination: Pedal skin thin, shiny and atrophic b/l. Toenails 1-5 b/l thick, discolored, elongated with subungual debris and pain on dorsal palpation.   Incurvated nailplate lateral border left hallux.  Nail border hypertrophy absent. There is tenderness to palpation. Sign(s) of infection: no clinical signs of infection noted on examination today..  Musculoskeletal Examination: Muscle strength 5/5 to b/l LE.  Pes planus deformity noted bilateral LE.  Radiographs: None  Assessment/Plan: 1. Pain due to onychomycosis of toenails of both feet   2. Type II diabetes mellitus with peripheral circulatory disorder ([facility]     -Patient was evaluated and treated. All patient's and/or POA's questions/concerns answered on today's visit. -Continue foot and shoe inspections daily. Monitor blood glucose per PCP/Endocrinologist's recommendations. -Patient to continue soft, supportive shoe gear daily. -Toenails were debrided in length and girth both feet with sterile nail nippers and dremel without iatrogenic bleeding.  -No invasive procedure(s) performed. Offending nail border debrided and curretaged lateral border left hallux utilizing sterile nail nipper and currette. Border(s) cleansed with alcohol and triple antibiotic ointment applied. Patient/POA/Caregiver/Facility instructed to apply Neosporin Cream  to left great toe once daily for 7 days. Call office if there are any concerns. -Patient/POA to call should there be question/concern in the interim.   Return in about 3 months (around 05/29/2022).  JMarzetta Board DPM

## 2022-03-04 ENCOUNTER — Encounter: Payer: Self-pay | Admitting: Podiatry

## 2022-03-05 ENCOUNTER — Telehealth: Payer: Self-pay

## 2022-03-05 ENCOUNTER — Ambulatory Visit: Payer: Medicare HMO | Attending: Cardiology

## 2022-03-05 DIAGNOSIS — I5042 Chronic combined systolic (congestive) and diastolic (congestive) heart failure: Secondary | ICD-10-CM

## 2022-03-05 DIAGNOSIS — Z95810 Presence of automatic (implantable) cardiac defibrillator: Secondary | ICD-10-CM | POA: Diagnosis not present

## 2022-03-05 NOTE — Telephone Encounter (Signed)
Spoke with patient.  Requested to send manual remote transmission for fluid level review since automatic report was not received.  She agreed to do so.

## 2022-03-05 NOTE — Progress Notes (Signed)
EPIC Encounter for ICM Monitoring  Patient Name: [name] is a 72 y.o. female Date: 03/05/2022 Primary Care Physican: Everardo Beals, NP Primary Cardiologist: Skains/Weaver PA  Electrophysiologist: Quentin Ore 10/04/2021 Weight: 237 lbs 11/06/2021 Weight: 232 lbs 03/05/2022 Weight: 235 lbs   AT/AF Burden: 0% (taking Eliquis)                                                          ASpoke with patient and heart failure questions reviewed.  Transmission results reviewed.  Pt asymptomatic for fluid accumulation.  Reports feeling well at this time and voices no complaints.  She is unsure what changes have occurred that would cause possible fluid accumulation.  She reports compliance with Lasix.     CorVue thoracic impedance suggesting possible fluid accumulation starting 2/13 and returned to baseline 2/25.    Prescribed: Furosemide 40 mg take 1 tablet daily Potassium 20 mEq take 1 tablet daily   Labs: 01/26/2022 Creatinine 1.07, BUN 15, Potassium 4.1, Sodium 140, GFR 56  01/17/2022 Creatinine 0.99, BUN 18, Potassium 4.1, Sodium 135, GFR >60  01/16/2022 Creatinine 0.88, BUN 19, Potassium 3.9, Sodium 135, GFR >60  01/15/2022 Creatinine 1.04, BUN 20, Potassium 4.3, Sodium 136, GFR 57 01/14/2022 Creatinine 1.09, BUN 22, Potassium 3.8, Sodium 135, GFR 54  01/13/2022 Creatinine 1.09, BUN 21, Potassium 3.4, Sodium 137, GFR 54  01/12/2022 Creatinine 1.13, BUN 18, Potassium 3.7, Sodium 137, GFR 52  A complete set of results can be found in Results Review.   Recommendations:  Advised to limit salt and fluid intake.    Next ICM clinic phone appointment due: 04/09/2022.     Next 91 day remote transmission due: 04/23/2022.     EP/Cardiology Office Visits:  08/08/2022 with Dr Quentin Ore.  03/06/2022 with Dr Marlou Porch.   Copy of ICM check sent to Dr. Quentin Ore.   3 month ICM trend: 03/05/2022.    12-14 Month ICM trend:     [name], RN 03/05/2022 1:30 PM

## 2022-03-06 ENCOUNTER — Ambulatory Visit: Payer: Medicare HMO | Attending: Cardiology | Admitting: Cardiology

## 2022-03-06 ENCOUNTER — Encounter: Payer: Self-pay | Admitting: Cardiology

## 2022-03-06 VITALS — BP 115/63 | HR 54 | Ht 60.0 in | Wt 241.0 lb

## 2022-03-06 DIAGNOSIS — Z95810 Presence of automatic (implantable) cardiac defibrillator: Secondary | ICD-10-CM | POA: Diagnosis not present

## 2022-03-06 DIAGNOSIS — I5042 Chronic combined systolic (congestive) and diastolic (congestive) heart failure: Secondary | ICD-10-CM

## 2022-03-06 DIAGNOSIS — I82402 Acute embolism and thrombosis of unspecified deep veins of left lower extremity: Secondary | ICD-10-CM | POA: Diagnosis not present

## 2022-03-06 DIAGNOSIS — I2510 Atherosclerotic heart disease of native coronary artery without angina pectoris: Secondary | ICD-10-CM

## 2022-03-06 DIAGNOSIS — I4901 Ventricular fibrillation: Secondary | ICD-10-CM | POA: Diagnosis not present

## 2022-03-06 NOTE — Patient Instructions (Signed)
Medication Instructions:  The current medical regimen is effective;  continue present plan and medications.  *If you need a refill on your cardiac medications before your next appointment, please call your pharmacy*  Follow-Up: At [facility], you and your health needs are our priority.  As part of our continuing mission to provide you with exceptional heart care, we have created designated Provider Care Teams.  These Care Teams include your primary Cardiologist (physician) and Advanced Practice Providers (APPs -  Physician Assistants and Nurse Practitioners) who all work together to provide you with the care you need, when you need it.  We recommend signing up for the patient portal called "MyChart".  Sign up information is provided on this After Visit Summary.  MyChart is used to connect with patients for Virtual Visits (Telemedicine).  Patients are able to view lab/test results, encounter notes, upcoming appointments, etc.  Non-urgent messages can be sent to your provider as well.   To learn more about what you can do with MyChart, go to [email or web address].    Your next appointment:   3 month(s)  Provider:   Nicholes Rough, PA-C, Ambrose Pancoast, NP, Ermalinda Barrios, PA-C, Christen Bame, NP, or [name], PA-C

## 2022-03-06 NOTE — Progress Notes (Signed)
Cardiology Office Note:    Date:  03/06/2022   ID:  [name], DOB September 18, 1950, MRN [id number]  PCP:  Everardo Beals, NP   [facility] Providers Cardiologist:  Candee Furbish, MD Electrophysiologist:  Vickie Epley, MD     Referring MD: Everardo Beals, NP    History of Present Illness:    [name] is a 72 y.o. female here for the follow-up of coronary disease status post CABG x 5 in 2007 with ischemic cardiomyopathy left bundle branch block prior VT, ICD originally implanted in April 2008 with subsequent upgrades.  Dr. Lars Mage, EP.  Her LV port is plugged and nonfunctioning.  Not interested in pursuing cardiothoracic evaluation for epicardial LV lead.  Echo in 2021 showed EF of approximately 50%.  However, there is degradation of EF to 25% once again after V-fib appropriate ICD shock in 2024 January surrounding nausea vomiting diarrhea.  Goal-directed medical therapy was improved.  Cardiac catheterization following this most recent event in 2024 showed stable anatomy with patent grafts.  EF decline was not ischemically mediated.  Note, discharge weight was 230 pounds.  Currently feeling well.  She has glucose meter, blood sugar slightly low here in clinic at 62.  She is drinking ginger ale and eating a graham cracker.  No chest pain.  She once again reiterates that she did not feel her ventricular fibrillation ICD shock in January 2024.   Past Medical History:  Diagnosis Date   Abnormal LFTs    AICD (automatic cardioverter/defibrillator) present    Angina decubitus 11/06/2012   Angina pectoris ([place])    Anxiety    Arthritis    Back pain    Bundle branch block 05/2016   CAD (coronary artery disease) 06/18/2010   CHF (congestive heart failure) (HCC)    Chronic combined systolic and diastolic CHF, NYHA class 3 (HCC)    Chronic combined systolic and diastolic heart failure ([place])    Chronic systolic dysfunction of left ventricle 09/24/2012   Coronary  artery disease    s/p CABG 2007   Depression    Dermal mycosis    Diabetes mellitus    type II   DKA (diabetic ketoacidoses) 01/12/2019   DVT (deep venous thrombosis) ([place]) 09/2015   bilateral   Edema, lower extremity    Glaucoma    Hyperlipidemia    Hypertension    Hypoglycemia 09/09/2012   Insomnia    Insulin dependent diabetes mellitus ([place]) 04/23/2012   Ischemic cardiomyopathy    EF 30-35%, s/p ICD 4/08 by Dr Leonia Reeves   Joint pain    LBBB (left bundle branch block) 09/24/2012   Morbid obesity ([place]) 11/06/2012   Obesity    Oxygen dependent 2 L   Pain in left knee    Peripheral neuropathy    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   LIMA to D2, SVG to ramus intermediate, sequential SVG to OM1-OM2, SVG to RCA with EVH via both legs    Sleep apnea    uses O2 at night   Tinea    Type 2 diabetes mellitus with hyperglycemia, with long-term current use of insulin ([place]) 04/20/2019   Uncontrolled diabetes mellitus 02/2019   Ventricular tachycardia ([place]) 01/16/15   sustained VT terminated with ATP, CL 340 msec   Vitamin B 12 deficiency 11/05/2018   Vitamin D deficiency 11/05/2018    Past Surgical History:  Procedure Laterality Date   BI-VENTRICULAR IMPLANTABLE CARDIOVERTER DEFIBRILLATOR UPGRADE N/A 09/25/2012   Procedure: BI-VENTRICULAR IMPLANTABLE CARDIOVERTER DEFIBRILLATOR  UPGRADE;  Surgeon: Coralyn , MD;  Location: [facility] CATH LAB;  Service: Cardiovascular;  Laterality: N/A;   BREAST BIOPSY Right 04/05/2014   CARDIAC CATHETERIZATION     CARDIAC DEFIBRILLATOR PLACEMENT  4/08   by Dr Leonia Reeves (MDT)   COLONOSCOPY WITH PROPOFOL N/A 10/12/2016   Procedure: COLONOSCOPY WITH PROPOFOL;  Surgeon: Doran Stabler, MD;  Location: [facility] ENDOSCOPY;  Service: Gastroenterology;  Laterality: N/A;   CORONARY ARTERY BYPASS GRAFT  10/05/2005   by Dr Cyndia Bent   IMPLANTABLE CARDIOVERTER DEFIBRILLATOR IMPLANT  09/25/12   attempt of upgrade to CRT-D unsuccessful due to CS anatomy, SJM Unify [place] device placed with LV  port capped by Dr Rayann Heman   IR GENERIC HISTORICAL  10/03/2015   IR US GUIDE VASC ACCESS LEFT 10/03/2015 Jacqulynn Cadet, MD [facility]   IR GENERIC HISTORICAL  10/03/2015   IR VENO/EXT/UNI LEFT 10/03/2015 Jacqulynn Cadet, MD [facility]   IR GENERIC HISTORICAL  10/03/2015   IR VENOCAVAGRAM IVC 10/03/2015 Jacqulynn Cadet, MD [facility]   IR GENERIC HISTORICAL  10/03/2015   IR INFUSION THROMBOL VENOUS INITIAL (MS) 10/03/2015 Jacqulynn Cadet, MD [facility]   IR GENERIC HISTORICAL  10/03/2015   IR US GUIDE VASC ACCESS LEFT 10/03/2015 Jacqulynn Cadet, MD [facility]   IR GENERIC HISTORICAL  10/04/2015   IR THROMBECT VENO MECH MOD SED 10/04/2015 Sandi Mariscal, MD [facility]   IR GENERIC HISTORICAL  10/04/2015   IR TRANSCATH PLC STENT 1ST ART NOT LE CV CAR VERT CAR 10/04/2015 Sandi Mariscal, MD [facility]   IR GENERIC HISTORICAL  10/04/2015   IR THROMB F/U EVAL ART/VEN FINAL DAY (MS) 10/04/2015 Sandi Mariscal, MD [facility]   IR GENERIC HISTORICAL  11/02/2015   IR RADIOLOGIST EVAL & MGMT 11/02/2015 Sandi Mariscal, MD [facility]   IR RADIOLOGIST EVAL & MGMT  04/26/2016   LEFT HEART CATH AND CORS/GRAFTS ANGIOGRAPHY N/A 01/19/2019   Procedure: LEFT HEART CATH AND CORS/GRAFTS ANGIOGRAPHY;  Surgeon: Belva Crome, MD;  Location: [place] [facility];  Service: Cardiovascular;  Laterality: N/A;   LEFT HEART CATH AND CORS/GRAFTS ANGIOGRAPHY N/A 01/15/2022   Procedure: LEFT HEART CATH AND CORS/GRAFTS ANGIOGRAPHY;  Surgeon: Lorretta Harp, MD;  Location: [place] [facility];  Service: Cardiovascular;  Laterality: N/A;    Current Medications: Current Meds  Medication Sig   albuterol (PROVENTIL) (2.5 MG/3ML) 0.083% nebulizer solution Take 2.5 mg by nebulization every 6 (six) hours as needed for wheezing or shortness of breath.    Alcohol Swabs (ALCOHOL WIPES) 70 % PADS DropSafe Alcohol Prep Pads  USE WHEN CHECKING BLOOD SUGARS   amiodarone (PACERONE) 200 MG tablet Take 2 tablets ('400mg'$ ) by mouth daily  for one week, then reduce to 1 tablet ('200mg'$ ) daily.   carvedilol (COREG) 12.5 MG tablet Take 1 tablet (12.5 mg total) by mouth 2 (two) times daily with a meal.   Continuous Blood Gluc Receiver (DEXCOM G7 RECEIVER) DEVI 1 Device by Does not apply route as directed.   Continuous Blood Gluc Sensor (DEXCOM G7 SENSOR) MISC 1 Device by Does not apply route as directed.   cyanocobalamin (,VITAMIN B-12,) 1000 MCG/ML injection 1,000 mcg every 30 (thirty) days.   dapagliflozin propanediol (FARXIGA) 10 MG TABS tablet Take 1 tablet (10 mg total) by mouth daily.   ELIQUIS 5 MG TABS tablet TAKE 1 TABLET TWICE DAILY   ferrous sulfate 325 (65 FE) MG EC tablet Take 325 mg by mouth every other day.   furosemide (LASIX)  40 MG tablet Take 1 tablet (40 mg total) by mouth daily.   gabapentin (NEURONTIN) 300 MG capsule Take 300 mg by mouth at bedtime.   glucose blood (ACCU-CHEK GUIDE) test strip Check 3 times daily   insulin degludec (TRESIBA FLEXTOUCH) 100 UNIT/ML FlexTouch Pen Inject 30 Units into the skin daily.   Insulin Pen Needle 31G X 5 MM MISC 1 Device by Does not apply route in the morning, at noon, in the evening, and at bedtime.   isosorbide mononitrate (IMDUR) 30 MG 24 hr tablet Take 30 mg by mouth daily.   latanoprost (XALATAN) 0.005 % ophthalmic solution Place 1 drop into both eyes at bedtime.   nitroGLYCERIN (NITROSTAT) 0.4 MG SL tablet Place 1 tablet (0.4 mg total) under the tongue every 5 (five) minutes as needed for chest pain.   NOVOLOG FLEXPEN 100 UNIT/ML FlexPen INJECT PER SLIDING SCALE AS DIRECTED , MAX 30 UNITS PER DAY. DISCARD PEN 28 DAYS AFTER OPENING (Patient taking differently: Inject 10-15 Units into the skin 3 (three) times daily with meals. Per sliding scale)   potassium chloride SA (KLOR-CON) 20 MEQ tablet Take 20 mEq by mouth daily as needed (for low pottassium).   rosuvastatin (CRESTOR) 20 MG tablet Take 1 tablet (20 mg total) by mouth daily at 6 PM.   sacubitril-valsartan (ENTRESTO)  24-26 MG Take 1 tablet by mouth 2 (two) times daily.   spironolactone (ALDACTONE) 25 MG tablet Take 0.5 tablets (12.5 mg total) by mouth daily.   TRUE METRIX BLOOD GLUCOSE TEST test strip TEST THREE TIMES DAILY   Vitamin D, Ergocalciferol, (DRISDOL) 1.25 MG (50000 UT) CAPS capsule Take 1 capsule (50,000 Units total) by mouth every 7 (seven) days.     Allergies:   Metformin and related, Ozempic (0.25 or 0.5 mg-dose) [semaglutide(0.25 or 0.'5mg'$ -dos)], Rosuvastatin, Atorvastatin calcium, and Levofloxacin   Social History   Socioeconomic History   Marital status: Legally Separated    Spouse name: Not on file   Number of children: 3   Years of education: 17   Highest education level: Not on file  Occupational History   Occupation: retired    Fish farm manager: UNEMPLOYED  Tobacco Use   Smoking status: Never   Smokeless tobacco: Never  Vaping Use   Vaping Use: Never used  Substance and Sexual Activity   Alcohol use: No   Drug use: No   Sexual activity: Not Currently  Other Topics Concern   Not on file  Social History Narrative   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   11/04/15 Lives with son    caffeine - coffee, 1-2 cups daily   Social Determinants of Health   Financial Resource Strain: Low Risk  (01/15/2022)   Overall Financial Resource Strain (CARDIA)    Difficulty of Paying Living Expenses: Not very hard  Food Insecurity: No Food Insecurity (01/12/2022)   Hunger Vital Sign    Worried About Running Out of Food in the Last Year: Never true    Ran Out of Food in the Last Year: Never true  Transportation Needs: No Transportation Needs (01/12/2022)   PRAPARE - Hydrologist (Medical): No    Lack of Transportation (Non-Medical): No  Physical Activity: Not on file  Stress: Not on file  Social Connections: Not on file     Family History: The patient's family history includes Diabetes (age of onset: 13) in her mother; High blood  pressure in her mother; Obesity in her mother;  Other in an other family member; Stroke in her mother; Sudden death in her mother.  ROS:   Please see the history of present illness.    No bleeding issues.  All other systems reviewed and are negative.  EKGs/Labs/Other Studies Reviewed:    The following studies were reviewed today: Device information Abbott ICD implanted 05/01/2006 >> attempts at CRT upgrade and gen change 09/25/2012  (No suitable veins for LV lead) CRT device, though LV port is plugged + appropriate therapy 01/18/2019 and 01/2022, VF  CARDIAC CATH 01/15/2022 (after ICD shock)   Mid LAD to Dist LAD lesion is 80% stenosed.   Prox Cx lesion is 100% stenosed.   Prox RCA to Mid RCA lesion is 100% stenosed.   Ramus lesion is 100% stenosed.   1st Diag lesion is 100% stenosed.   Prox Graft lesion is 50% stenosed. Unchanged from 2021   Diagnostic Dominance: Right  ECHO 01/13/2022  1. Left ventricular ejection fraction, by estimation, is 25 to 30%. The  left ventricle has severely decreased function. The left ventricle  demonstrates global hypokinesis. There is moderate left ventricular  hypertrophy. Left ventricular diastolic  parameters are consistent with Grade I diastolic dysfunction (impaired  relaxation).   2. Right ventricular systolic function is mildly reduced. The right  ventricular size is mildly enlarged. There is normal pulmonary artery  systolic pressure. The estimated right ventricular systolic pressure is  [id number] mmHg.   3. The mitral valve is grossly normal. No evidence of mitral valve  regurgitation. No evidence of mitral stenosis.   4. The aortic valve was not well visualized. Aortic valve regurgitation  is not visualized. Aortic valve sclerosis/calcification is present,  without any evidence of aortic stenosis.   5. The inferior vena cava is normal in size with greater than 50%  respiratory variability, suggesting right atrial pressure of 3 mmHg.    Comparison(s): Changes from prior study are noted. LVEF worsened from 50%  in 2021 to 25% now.    Recent Labs: 01/17/2022: Hemoglobin 12.9; Magnesium 2.1; Platelets 138 01/26/2022: B Natriuretic Peptide 86.9 01/31/2022: ALT 19; BUN 23; Creatinine, Ser 1.15; Potassium 3.9; Sodium 146; TSH 2.060  Recent Lipid Panel    Component Value Date/Time   CHOL 125 04/28/2021 1003   CHOL 111 11/03/2018 1205   CHOL 90 12/22/2012 1042   TRIG 78.0 04/28/2021 1003   TRIG 85 12/22/2012 1042   HDL 44.20 04/28/2021 1003   HDL 30 (L) 11/03/2018 1205   HDL 33 (L) 12/22/2012 1042   CHOLHDL 3 04/28/2021 1003   VLDL 15.6 04/28/2021 1003   LDLCALC 65 04/28/2021 1003   LDLCALC 64 11/03/2018 1205   LDLCALC 40 12/22/2012 1042     Risk Assessment/Calculations:              Physical Exam:    VS:  BP 115/63 (BP Location: Left Arm, Patient Position: Sitting, Cuff Size: Normal)   Pulse (!) 54   Ht 5' (1.524 m)   Wt 241 lb (109.3 kg)   SpO2 97%   BMI 47.07 kg/m     Wt Readings from Last 3 Encounters:  03/06/22 241 lb (109.3 kg)  01/31/22 222 lb (100.7 kg)  01/26/22 241 lb (109.3 kg)     GEN:  Well nourished, well developed in no acute distress HEENT: Normal NECK: No JVD; No carotid bruits LYMPHATICS: No lymphadenopathy CARDIAC: RRR, no murmurs, no rubs, gallops RESPIRATORY:  Clear to auscultation without rales, wheezing or rhonchi  ABDOMEN: Soft, non-tender,  non-distended MUSCULOSKELETAL:  No edema; No deformity  SKIN: Warm and dry NEUROLOGIC:  Alert and oriented x 3 PSYCHIATRIC:  Normal affect   ASSESSMENT:    1. Chronic combined systolic and diastolic heart failure ([place])   2. ICD (implantable cardioverter-defibrillator) in place   3. Ventricular fibrillation ([place])   4. Deep vein thrombosis (DVT) of left lower extremity, unspecified chronicity, unspecified vein (HCC)   5. Coronary artery disease without angina pectoris, unspecified vessel or lesion type, unspecified whether native  or transplanted heart     PLAN:    In order of problems listed above:   Chronic combined systolic and diastolic heart failure ([place]) Continue with optimized goal-directed medical therapy.  Taking carvedilol Farxiga Lasix Entresto and spironolactone.  NYHA class II symptoms.  Overall fairly euvolemic.  CAD (coronary artery disease) No chest pain, no ischemic symptoms.  Cardiac catheterization as reviewed above.  No changes in medical management.  Ventricular fibrillation Previously hospitalized after ICD therapy for ventricular fibrillation.  Has been seeing Dr. Lars Mage.  She had been sick with nausea and vomiting prior to ICD therapy.  She was given amiodarone during the hospitalization.  Heart catheterization January 15, 2022 showed no progression in coronary artery disease.  She was started on spironolactone   January 31, 2022 in clinic device interrogation  Battery longevity 10.4 months Lead parameter stable 19% atrial pacing Less than 1% RV pacing 0% A-fib burden No further high-voltage therapies since last hospitalization  History of DVT Has been on Eliquis therapy.   At next clinic visit, consider obtaining basic metabolic profile.  I would also like a repeat echocardiogram.     Medication Adjustments/Labs and Tests Ordered: Current medicines are reviewed at length with the patient today.  Concerns regarding medicines are outlined above.  No orders of the defined types were placed in this encounter.  No orders of the defined types were placed in this encounter.   Patient Instructions  Medication Instructions:  The current medical regimen is effective;  continue present plan and medications.  *If you need a refill on your cardiac medications before your next appointment, please call your pharmacy*  Follow-Up: At [facility], you and your health needs are our priority.  As part of our continuing mission to provide you with exceptional heart care, we  have created designated Provider Care Teams.  These Care Teams include your primary Cardiologist (physician) and Advanced Practice Providers (APPs -  Physician Assistants and Nurse Practitioners) who all work together to provide you with the care you need, when you need it.  We recommend signing up for the patient portal called "MyChart".  Sign up information is provided on this After Visit Summary.  MyChart is used to connect with patients for Virtual Visits (Telemedicine).  Patients are able to view lab/test results, encounter notes, upcoming appointments, etc.  Non-urgent messages can be sent to your provider as well.   To learn more about what you can do with MyChart, go to [email or web address].    Your next appointment:   3 month(s)  Provider:   Nicholes Rough, PA-C, Ambrose Pancoast, NP, Ermalinda Barrios, PA-C, Christen Bame, NP, or [name], PA-C            Signed, Candee Furbish, MD  03/06/2022 4:02 PM    [place]

## 2022-03-14 NOTE — Progress Notes (Signed)
Remote ICD transmission.   

## 2022-04-09 ENCOUNTER — Ambulatory Visit: Payer: Medicare HMO | Attending: Cardiology

## 2022-04-09 DIAGNOSIS — Z95810 Presence of automatic (implantable) cardiac defibrillator: Secondary | ICD-10-CM | POA: Diagnosis not present

## 2022-04-09 DIAGNOSIS — I5042 Chronic combined systolic (congestive) and diastolic (congestive) heart failure: Secondary | ICD-10-CM | POA: Diagnosis not present

## 2022-04-10 NOTE — Progress Notes (Signed)
EPIC Encounter for ICM Monitoring  Patient Name: [name] is a 72 y.o. female Date: 04/10/2022 Primary Care Physican: Everardo Beals, NP Primary Cardiologist: Skains/Weaver PA  Electrophysiologist: Quentin Ore 10/04/2021 Weight: 237 lbs 11/06/2021 Weight: 232 lbs 03/05/2022 Weight: 235 lbs 04/10/2022 Weight: 230 lbs   AT/AF Burden: 0% (taking Eliquis)  Battery Longevity: 7.6 months                                                          Spoke with patient and heart failure questions reviewed.  Transmission results reviewed.  Pt asymptomatic for fluid accumulation.  Reports feeling well at this time and voices no complaints.  Discussed diet and fluid intake.  She thinks she may be drinking over 64 oz fluid daily.  Encouraged to review food labels for salt content.    CorVue thoracic impedance suggesting possible fluid accumulation starting 3/23.  Also suggesting possible fluid accumulation from 3/9-3/16.    Prescribed: Furosemide 40 mg take 1 tablet daily Potassium 20 mEq take 1 tablet daily   Labs: 01/31/2022 Creatinine 1.15, BUN 23, Potassium 3.9, Sodium 146, GFR 51 01/26/2022 Creatinine 1.07, BUN 15, Potassium 4.1, Sodium 140, GFR 56  01/17/2022 Creatinine 0.99, BUN 18, Potassium 4.1, Sodium 135, GFR >60  01/16/2022 Creatinine 0.88, BUN 19, Potassium 3.9, Sodium 135, GFR >60  01/15/2022 Creatinine 1.04, BUN 20, Potassium 4.3, Sodium 136, GFR 57 01/14/2022 Creatinine 1.09, BUN 22, Potassium 3.8, Sodium 135, GFR 54  01/13/2022 Creatinine 1.09, BUN 21, Potassium 3.4, Sodium 137, GFR 54  01/12/2022 Creatinine 1.13, BUN 18, Potassium 3.7, Sodium 137, GFR 52  A complete set of results can be found in Results Review.   Recommendations:  Recommendation to limit salt intake to 2000 mg daily and fluid intake to 64 oz daily.  Encouraged to call if experiencing any fluid symptoms.    Next ICM clinic phone appointment due: 04/16/2022 to recheck fluid levels.     Next 91 day remote  transmission due: 04/23/2022.     EP/Cardiology Office Visits:  08/08/2022 with Dr Quentin Ore.  06/05/2022 with Christen Bame, NP   Copy of ICM check sent to Dr. Quentin Ore and Dr Marlou Porch for review and recommendations if needed.    3 month ICM trend: 04/09/2022.    12-14 Month ICM trend:     [name], RN 04/10/2022 1:24 PM

## 2022-04-16 ENCOUNTER — Telehealth: Payer: Self-pay

## 2022-04-16 ENCOUNTER — Ambulatory Visit: Payer: Medicare HMO | Attending: Cardiology

## 2022-04-16 DIAGNOSIS — Z95810 Presence of automatic (implantable) cardiac defibrillator: Secondary | ICD-10-CM

## 2022-04-16 DIAGNOSIS — I5042 Chronic combined systolic (congestive) and diastolic (congestive) heart failure: Secondary | ICD-10-CM

## 2022-04-16 NOTE — Telephone Encounter (Signed)
Remote ICM transmission received.  Attempted call to patient regarding ICM remote transmission and no answer.  

## 2022-04-16 NOTE — Progress Notes (Signed)
Spoke with patient and heart failure questions reviewed.  Transmission results reviewed.  Pt reports weight stable at 230 lbs and feeling okay.  She reports cooking large ham on Sunday and has been eating left overs.  Advised to decrease salt intake and limit fluid intake to 64 oz daily.  She will self adjust Furosemide and Potassium for the next 2 days to help resolve fluid accumulation.  Will recheck fluid levels on 4/15.

## 2022-04-16 NOTE — Progress Notes (Signed)
EPIC Encounter for ICM Monitoring  Patient Name: [name] is a 72 y.o. female Date: 04/16/2022 Primary Care Physican: Marva Panda, NP Primary Cardiologist: Skains/Weaver PA  Electrophysiologist: Lalla Brothers 10/04/2021 Weight: 237 lbs 11/06/2021 Weight: 232 lbs 03/05/2022 Weight: 235 lbs 04/10/2022 Weight: 230 lbs   AT/AF Burden: 0% (taking Eliquis)   Battery Longevity: 7.5 months                                                          Attempted call to patient and unable to reach.   Transmission reviewed.     CorVue thoracic impedance suggesting possible fluid accumulation recurred starting 4/6.  Also suggesting possible fluid accumulation from 3/23-4/2.    Prescribed: Furosemide 40 mg take 1 tablet daily Potassium 20 mEq take 1 tablet daily   Labs: 01/31/2022 Creatinine 1.15, BUN 23, Potassium 3.9, Sodium 146, GFR 51 01/26/2022 Creatinine 1.07, BUN 15, Potassium 4.1, Sodium 140, GFR 56  01/17/2022 Creatinine 0.99, BUN 18, Potassium 4.1, Sodium 135, GFR >60  01/16/2022 Creatinine 0.88, BUN 19, Potassium 3.9, Sodium 135, GFR >60  01/15/2022 Creatinine 1.04, BUN 20, Potassium 4.3, Sodium 136, GFR 57 01/14/2022 Creatinine 1.09, BUN 22, Potassium 3.8, Sodium 135, GFR 54  01/13/2022 Creatinine 1.09, BUN 21, Potassium 3.4, Sodium 137, GFR 54  01/12/2022 Creatinine 1.13, BUN 18, Potassium 3.7, Sodium 137, GFR 52  A complete set of results can be found in Results Review.   Recommendations:  Unable to reach.     Next ICM clinic phone appointment due: 04/23/2022 to recheck fluid levels.     Next 91 day remote transmission due: 04/23/2022.     EP/Cardiology Office Visits:  08/08/2022 with Dr Lalla Brothers.  06/05/2022 with Eligha Bridegroom, NP   Copy of ICM check sent to Dr. Lalla Brothers and Dr Anne Fu for review if patient is reached.      3 month ICM trend: 04/16/2022.    12-14 Month ICM trend:     Karie Soda, RN 04/16/2022 2:23 PM

## 2022-04-18 ENCOUNTER — Other Ambulatory Visit: Payer: Self-pay

## 2022-04-18 MED ORDER — NITROGLYCERIN 0.4 MG SL SUBL: 0.4000 mg | SUBLINGUAL_TABLET | SUBLINGUAL | 2 refills | Status: DC | PRN

## 2022-04-23 ENCOUNTER — Ambulatory Visit (INDEPENDENT_AMBULATORY_CARE_PROVIDER_SITE_OTHER): Payer: Medicare HMO

## 2022-04-23 DIAGNOSIS — I5042 Chronic combined systolic (congestive) and diastolic (congestive) heart failure: Secondary | ICD-10-CM

## 2022-04-23 LAB — CUP PACEART REMOTE DEVICE CHECK
Battery Remaining Longevity: 8 mo
Battery Remaining Percentage: 7 %
Battery Voltage: 2.65 V
Brady Statistic AP VP Percent: 2.6 %
Brady Statistic AP VS Percent: 54 %
Brady Statistic AS VP Percent: 1 %
Brady Statistic AS VS Percent: 41 %
Brady Statistic RA Percent Paced: 51 %
Brady Statistic RV Percent Paced: 3 %
Date Time Interrogation Session: 20240415135233
HighPow Impedance: 42 Ohm
HighPow Impedance: 42 Ohm
Implantable Lead Connection Status: 753985
Implantable Lead Connection Status: 753985
Implantable Lead Connection Status: 753985
Implantable Lead Implant Date: 20080423
Implantable Lead Implant Date: 20080423
Implantable Lead Implant Date: 20140918
Implantable Lead Location: 753858
Implantable Lead Location: 753859
Implantable Lead Location: 753860
Implantable Lead Model: 5076
Implantable Lead Model: 6947
Implantable Pulse Generator Implant Date: 20140918
Lead Channel Impedance Value: 430 Ohm
Lead Channel Impedance Value: 550 Ohm
Lead Channel Pacing Threshold Amplitude: 0.75 V
Lead Channel Pacing Threshold Amplitude: 0.75 V
Lead Channel Pacing Threshold Pulse Width: 0.5 ms
Lead Channel Pacing Threshold Pulse Width: 0.5 ms
Lead Channel Sensing Intrinsic Amplitude: 1.7 mV
Lead Channel Sensing Intrinsic Amplitude: 12 mV
Lead Channel Setting Pacing Amplitude: 2 V
Lead Channel Setting Pacing Amplitude: 2.5 V
Lead Channel Setting Pacing Pulse Width: 0.5 ms
Lead Channel Setting Sensing Sensitivity: 0.5 mV
Pulse Gen Serial Number: 7070007

## 2022-04-24 ENCOUNTER — Ambulatory Visit: Payer: Medicare HMO | Attending: Cardiology

## 2022-04-24 ENCOUNTER — Telehealth: Payer: Self-pay

## 2022-04-24 DIAGNOSIS — Z95810 Presence of automatic (implantable) cardiac defibrillator: Secondary | ICD-10-CM

## 2022-04-24 DIAGNOSIS — I5042 Chronic combined systolic (congestive) and diastolic (congestive) heart failure: Secondary | ICD-10-CM

## 2022-04-24 NOTE — Telephone Encounter (Signed)
Remote ICM transmission received.  Attempted call to patient regarding ICM remote transmission and left detailed message per DPR.  Advised to return call for any fluid symptoms or questions. Next ICM remote transmission scheduled 05/28/2022.    

## 2022-04-24 NOTE — Progress Notes (Signed)
Spoke with patient and heart failure questions reviewed.  Transmission results reviewed.  Pt reports taking extra Furosemide x 5 days instead of the 2 days and advised it is causing dryness.  Advised to return to prescribed dosage of Lasix 1 a day and for only take 2 days worth of extra Furosemide in the future.  She verbalized understanding.  Advised to drink extra fluids today and tomorrow and then resume 64 oz daily.

## 2022-04-24 NOTE — Progress Notes (Signed)
EPIC Encounter for ICM Monitoring  Patient Name: [name] is a 72 y.o. female Date: 04/24/2022 Primary Care Physican: Marva Panda, NP Primary Cardiologist: Skains/Weaver PA  Electrophysiologist: Lalla Brothers 10/04/2021 Weight: 237 lbs 11/06/2021 Weight: 232 lbs 03/05/2022 Weight: 235 lbs 04/16/2022 Weight: 230 lbs   AT/AF Burden: 0% (taking Eliquis)   Battery Longevity: 7.6 months                                                          Attempted call to patient and unable to reach.  Left message to return call per DPR regarding transmission. Transmission reviewed.     CorVue thoracic impedance suggesting fluid levels returned to normal but possible dryness in response to taking extra Furosemide x 2 days.      Prescribed: Furosemide 40 mg take 1 tablet daily Potassium 20 mEq take 1 tablet daily   Labs: 01/31/2022 Creatinine 1.15, BUN 23, Potassium 3.9, Sodium 146, GFR 51 01/26/2022 Creatinine 1.07, BUN 15, Potassium 4.1, Sodium 140, GFR 56  01/17/2022 Creatinine 0.99, BUN 18, Potassium 4.1, Sodium 135, GFR >60  01/16/2022 Creatinine 0.88, BUN 19, Potassium 3.9, Sodium 135, GFR >60  01/15/2022 Creatinine 1.04, BUN 20, Potassium 4.3, Sodium 136, GFR 57 01/14/2022 Creatinine 1.09, BUN 22, Potassium 3.8, Sodium 135, GFR 54  01/13/2022 Creatinine 1.09, BUN 21, Potassium 3.4, Sodium 137, GFR 54  01/12/2022 Creatinine 1.13, BUN 18, Potassium 3.7, Sodium 137, GFR 52  A complete set of results can be found in Results Review.   Recommendations:  Unable to reach.     Next ICM clinic phone appointment due: 05/14/2022.     Next 91 day remote transmission due: 07/23/2022.     EP/Cardiology Office Visits:  08/08/2022 with Dr Lalla Brothers.  06/05/2022 with Eligha Bridegroom, NP   Copy of ICM check sent to Dr. Lalla Brothers   3 month ICM trend: 04/23/2022.    12-14 Month ICM trend:     Karie Soda, RN 04/24/2022 7:40 AM

## 2022-04-27 ENCOUNTER — Encounter (HOSPITAL_COMMUNITY): Payer: Self-pay | Admitting: Emergency Medicine

## 2022-04-27 ENCOUNTER — Emergency Department (HOSPITAL_COMMUNITY): Payer: Medicare HMO

## 2022-04-27 ENCOUNTER — Inpatient Hospital Stay (HOSPITAL_COMMUNITY)
Admission: EM | Admit: 2022-04-27 | Discharge: 2022-05-01 | DRG: 690 | Disposition: A | Discharge status: Home / Self Care | Payer: Medicare HMO | Attending: Internal Medicine | Admitting: Internal Medicine

## 2022-04-27 ENCOUNTER — Other Ambulatory Visit: Payer: Self-pay

## 2022-04-27 DIAGNOSIS — G4700 Insomnia, unspecified: Secondary | ICD-10-CM | POA: Diagnosis present

## 2022-04-27 DIAGNOSIS — N3000 Acute cystitis without hematuria: Secondary | ICD-10-CM | POA: Diagnosis present

## 2022-04-27 DIAGNOSIS — F32A Depression, unspecified: Secondary | ICD-10-CM | POA: Diagnosis present

## 2022-04-27 DIAGNOSIS — Z951 Presence of aortocoronary bypass graft: Secondary | ICD-10-CM

## 2022-04-27 DIAGNOSIS — F419 Anxiety disorder, unspecified: Secondary | ICD-10-CM | POA: Diagnosis present

## 2022-04-27 DIAGNOSIS — Z86718 Personal history of other venous thrombosis and embolism: Secondary | ICD-10-CM

## 2022-04-27 DIAGNOSIS — E875 Hyperkalemia: Secondary | ICD-10-CM | POA: Diagnosis present

## 2022-04-27 DIAGNOSIS — N182 Chronic kidney disease, stage 2 (mild): Secondary | ICD-10-CM | POA: Diagnosis present

## 2022-04-27 DIAGNOSIS — R0989 Other specified symptoms and signs involving the circulatory and respiratory systems: Secondary | ICD-10-CM | POA: Diagnosis not present

## 2022-04-27 DIAGNOSIS — R9431 Abnormal electrocardiogram [ECG] [EKG]: Secondary | ICD-10-CM | POA: Diagnosis present

## 2022-04-27 DIAGNOSIS — I5042 Chronic combined systolic (congestive) and diastolic (congestive) heart failure: Secondary | ICD-10-CM | POA: Diagnosis present

## 2022-04-27 DIAGNOSIS — Z8674 Personal history of sudden cardiac arrest: Secondary | ICD-10-CM

## 2022-04-27 DIAGNOSIS — M1990 Unspecified osteoarthritis, unspecified site: Secondary | ICD-10-CM | POA: Diagnosis present

## 2022-04-27 DIAGNOSIS — Z6841 Body Mass Index (BMI) 40.0 and over, adult: Secondary | ICD-10-CM

## 2022-04-27 DIAGNOSIS — I4891 Unspecified atrial fibrillation: Secondary | ICD-10-CM | POA: Diagnosis present

## 2022-04-27 DIAGNOSIS — E119 Type 2 diabetes mellitus without complications: Secondary | ICD-10-CM

## 2022-04-27 DIAGNOSIS — I2510 Atherosclerotic heart disease of native coronary artery without angina pectoris: Secondary | ICD-10-CM | POA: Diagnosis present

## 2022-04-27 DIAGNOSIS — I130 Hypertensive heart and chronic kidney disease with heart failure and stage 1 through stage 4 chronic kidney disease, or unspecified chronic kidney disease: Secondary | ICD-10-CM | POA: Diagnosis present

## 2022-04-27 DIAGNOSIS — N390 Urinary tract infection, site not specified: Secondary | ICD-10-CM | POA: Diagnosis not present

## 2022-04-27 DIAGNOSIS — I10 Essential (primary) hypertension: Secondary | ICD-10-CM | POA: Diagnosis not present

## 2022-04-27 DIAGNOSIS — Z95810 Presence of automatic (implantable) cardiac defibrillator: Secondary | ICD-10-CM

## 2022-04-27 DIAGNOSIS — E1159 Type 2 diabetes mellitus with other circulatory complications: Secondary | ICD-10-CM | POA: Diagnosis not present

## 2022-04-27 DIAGNOSIS — G4733 Obstructive sleep apnea (adult) (pediatric): Secondary | ICD-10-CM | POA: Diagnosis present

## 2022-04-27 DIAGNOSIS — Z79899 Other long term (current) drug therapy: Secondary | ICD-10-CM

## 2022-04-27 DIAGNOSIS — I447 Left bundle-branch block, unspecified: Secondary | ICD-10-CM | POA: Diagnosis present

## 2022-04-27 DIAGNOSIS — N179 Acute kidney failure, unspecified: Secondary | ICD-10-CM | POA: Diagnosis present

## 2022-04-27 DIAGNOSIS — K8020 Calculus of gallbladder without cholecystitis without obstruction: Secondary | ICD-10-CM | POA: Diagnosis present

## 2022-04-27 DIAGNOSIS — E785 Hyperlipidemia, unspecified: Secondary | ICD-10-CM | POA: Diagnosis present

## 2022-04-27 DIAGNOSIS — Z7901 Long term (current) use of anticoagulants: Secondary | ICD-10-CM

## 2022-04-27 DIAGNOSIS — I959 Hypotension, unspecified: Secondary | ICD-10-CM | POA: Diagnosis present

## 2022-04-27 DIAGNOSIS — Z8249 Family history of ischemic heart disease and other diseases of the circulatory system: Secondary | ICD-10-CM

## 2022-04-27 DIAGNOSIS — Z833 Family history of diabetes mellitus: Secondary | ICD-10-CM

## 2022-04-27 DIAGNOSIS — J45909 Unspecified asthma, uncomplicated: Secondary | ICD-10-CM | POA: Diagnosis present

## 2022-04-27 DIAGNOSIS — E1122 Type 2 diabetes mellitus with diabetic chronic kidney disease: Secondary | ICD-10-CM | POA: Diagnosis present

## 2022-04-27 DIAGNOSIS — Z635 Disruption of family by separation and divorce: Secondary | ICD-10-CM

## 2022-04-27 DIAGNOSIS — J9611 Chronic respiratory failure with hypoxia: Secondary | ICD-10-CM | POA: Diagnosis present

## 2022-04-27 DIAGNOSIS — E11649 Type 2 diabetes mellitus with hypoglycemia without coma: Secondary | ICD-10-CM | POA: Diagnosis not present

## 2022-04-27 DIAGNOSIS — E876 Hypokalemia: Secondary | ICD-10-CM | POA: Diagnosis present

## 2022-04-27 DIAGNOSIS — Z794 Long term (current) use of insulin: Secondary | ICD-10-CM

## 2022-04-27 DIAGNOSIS — I255 Ischemic cardiomyopathy: Secondary | ICD-10-CM | POA: Diagnosis present

## 2022-04-27 DIAGNOSIS — Z888 Allergy status to other drugs, medicaments and biological substances status: Secondary | ICD-10-CM

## 2022-04-27 DIAGNOSIS — R001 Bradycardia, unspecified: Secondary | ICD-10-CM | POA: Diagnosis present

## 2022-04-27 DIAGNOSIS — Z881 Allergy status to other antibiotic agents status: Secondary | ICD-10-CM

## 2022-04-27 DIAGNOSIS — Z9981 Dependence on supplemental oxygen: Secondary | ICD-10-CM

## 2022-04-27 DIAGNOSIS — E861 Hypovolemia: Secondary | ICD-10-CM | POA: Diagnosis present

## 2022-04-27 DIAGNOSIS — N200 Calculus of kidney: Secondary | ICD-10-CM | POA: Diagnosis present

## 2022-04-27 DIAGNOSIS — H409 Unspecified glaucoma: Secondary | ICD-10-CM | POA: Diagnosis present

## 2022-04-27 LAB — CBC WITH DIFFERENTIAL/PLATELET
Abs Immature Granulocytes: 0.01 10*3/uL (ref 0.00–0.07)
Basophils Absolute: 0 10*3/uL (ref 0.0–0.1)
Basophils Relative: 1 %
Eosinophils Absolute: 0.1 10*3/uL (ref 0.0–0.5)
Eosinophils Relative: 1 %
HCT: 38.6 % (ref 36.0–46.0)
Hemoglobin: 12.6 g/dL (ref 12.0–15.0)
Immature Granulocytes: 0 %
Lymphocytes Relative: 42 %
Lymphs Abs: 2.1 10*3/uL (ref 0.7–4.0)
MCH: 30.4 pg (ref 26.0–34.0)
MCHC: 32.6 g/dL (ref 30.0–36.0)
MCV: 93 fL (ref 80.0–100.0)
Monocytes Absolute: 0.6 10*3/uL (ref 0.1–1.0)
Monocytes Relative: 13 %
Neutro Abs: 2.1 10*3/uL (ref 1.7–7.7)
Neutrophils Relative %: 43 %
Platelets: 147 10*3/uL — ABNORMAL LOW (ref 150–400)
RBC: 4.15 MIL/uL (ref 3.87–5.11)
RDW: 15.1 % (ref 11.5–15.5)
WBC: 4.9 10*3/uL (ref 4.0–10.5)
nRBC: 0 % (ref 0.0–0.2)

## 2022-04-27 LAB — URINALYSIS, W/ REFLEX TO CULTURE (INFECTION SUSPECTED)
Bilirubin Urine: NEGATIVE
Glucose, UA: >=500 mg/dL — AB
Ketones, ur: NEGATIVE mg/dL
Nitrite: POSITIVE — AB
Protein, ur: NEGATIVE mg/dL
Specific Gravity, Urine: 1.007 (ref 1.005–1.030)
pH: 5 (ref 5.0–8.0)

## 2022-04-27 LAB — COMPREHENSIVE METABOLIC PANEL
ALT: 23 U/L (ref 0–44)
AST: 57 U/L — ABNORMAL HIGH (ref 15–41)
Albumin: 2.9 g/dL — ABNORMAL LOW (ref 3.5–5.0)
Alkaline Phosphatase: 70 U/L (ref 38–126)
Anion gap: 7 (ref 5–15)
BUN: 24 mg/dL — ABNORMAL HIGH (ref 8–23)
CO2: 24 mmol/L (ref 22–32)
Calcium: 8.3 mg/dL — ABNORMAL LOW (ref 8.9–10.3)
Chloride: 109 mmol/L (ref 98–111)
Creatinine, Ser: 1.42 mg/dL — ABNORMAL HIGH (ref 0.44–1.00)
GFR, Estimated: 39 mL/min — ABNORMAL LOW (ref >60)
Glucose, Bld: 88 mg/dL (ref 70–99)
Potassium: 5.3 mmol/L — ABNORMAL HIGH (ref 3.5–5.1)
Sodium: 140 mmol/L (ref 135–145)
Total Bilirubin: 2.3 mg/dL — ABNORMAL HIGH (ref 0.3–1.2)
Total Protein: 6.5 g/dL (ref 6.5–8.1)

## 2022-04-27 LAB — TROPONIN I (HIGH SENSITIVITY): Troponin I (High Sensitivity): 14 ng/L (ref <18)

## 2022-04-27 LAB — BRAIN NATRIURETIC PEPTIDE: B Natriuretic Peptide: 81.7 pg/mL (ref 0.0–100.0)

## 2022-04-27 LAB — CBG MONITORING, ED
Glucose-Capillary: 68 mg/dL — ABNORMAL LOW (ref 70–99)
Glucose-Capillary: 110 mg/dL — ABNORMAL HIGH (ref 70–99)

## 2022-04-27 LAB — URINE CULTURE

## 2022-04-27 LAB — LACTIC ACID, PLASMA: Lactic Acid, Venous: 1.3 mmol/L (ref 0.5–1.9)

## 2022-04-27 MED ORDER — APIXABAN 5 MG PO TABS
5.0000 mg | ORAL_TABLET | Freq: Two times a day (BID) | ORAL | Status: DC
  Administered 2022-04-28 – 2022-05-01 (×7): 5 mg via ORAL
  Filled 2022-04-27 (×8): qty 1

## 2022-04-27 MED ORDER — SACUBITRIL-VALSARTAN 24-26 MG PO TABS
1.0000 | ORAL_TABLET | Freq: Two times a day (BID) | ORAL | Status: DC
  Administered 2022-04-28 – 2022-05-01 (×7): 1 via ORAL
  Filled 2022-04-27 (×8): qty 1

## 2022-04-27 MED ORDER — INSULIN ASPART 100 UNIT/ML IJ SOLN
0.0000 [IU] | Freq: Every day | INTRAMUSCULAR | Status: DC
  Administered 2022-04-30: 2 [IU] via SUBCUTANEOUS

## 2022-04-27 MED ORDER — ALBUTEROL SULFATE (2.5 MG/3ML) 0.083% IN NEBU: 2.5000 mg | INHALATION_SOLUTION | RESPIRATORY_TRACT | Status: DC | PRN

## 2022-04-27 MED ORDER — SODIUM ZIRCONIUM CYCLOSILICATE 10 G PO PACK
10.0000 g | PACK | Freq: Once | ORAL | Status: DC
  Filled 2022-04-27: qty 1

## 2022-04-27 MED ORDER — AMIODARONE HCL 200 MG PO TABS
200.0000 mg | ORAL_TABLET | Freq: Every day | ORAL | Status: DC
  Administered 2022-04-28 – 2022-05-01 (×3): 200 mg via ORAL
  Filled 2022-04-27 (×4): qty 1

## 2022-04-27 MED ORDER — GABAPENTIN 300 MG PO CAPS
300.0000 mg | ORAL_CAPSULE | Freq: Every day | ORAL | Status: DC
  Administered 2022-04-27 – 2022-04-30 (×4): 300 mg via ORAL
  Filled 2022-04-27 (×4): qty 1

## 2022-04-27 MED ORDER — INSULIN ASPART 100 UNIT/ML IJ SOLN
0.0000 [IU] | Freq: Three times a day (TID) | INTRAMUSCULAR | Status: DC
  Administered 2022-04-28 (×3): 3 [IU] via SUBCUTANEOUS
  Administered 2022-04-29 – 2022-04-30 (×3): 5 [IU] via SUBCUTANEOUS
  Administered 2022-04-30 (×2): 3 [IU] via SUBCUTANEOUS
  Administered 2022-05-01: 5 [IU] via SUBCUTANEOUS

## 2022-04-27 MED ORDER — SODIUM CHLORIDE 0.9 % IV BOLUS (SEPSIS)
500.0000 mL | Freq: Once | INTRAVENOUS | Status: AC
  Administered 2022-04-27: 500 mL via INTRAVENOUS

## 2022-04-27 MED ORDER — AMIODARONE HCL 200 MG PO TABS: 200.0000 mg | ORAL_TABLET | Freq: Every day | ORAL | Status: DC

## 2022-04-27 MED ORDER — SODIUM CHLORIDE 0.9 % IV SOLN
1.0000 g | Freq: Once | INTRAVENOUS | Status: AC
  Administered 2022-04-27: 1 g via INTRAVENOUS
  Filled 2022-04-27: qty 10

## 2022-04-27 MED ORDER — ROSUVASTATIN CALCIUM 20 MG PO TABS
20.0000 mg | ORAL_TABLET | Freq: Every day | ORAL | Status: DC
  Administered 2022-04-28 – 2022-04-30 (×3): 20 mg via ORAL
  Filled 2022-04-27 (×3): qty 1

## 2022-04-27 MED ORDER — SODIUM CHLORIDE 0.9 % IV SOLN
1.0000 g | INTRAVENOUS | Status: DC
  Administered 2022-04-28 – 2022-04-30 (×3): 1 g via INTRAVENOUS
  Filled 2022-04-27 (×3): qty 10

## 2022-04-27 MED ORDER — ISOSORBIDE MONONITRATE ER 30 MG PO TB24
30.0000 mg | ORAL_TABLET | Freq: Every day | ORAL | Status: DC
  Administered 2022-04-28 – 2022-05-01 (×4): 30 mg via ORAL
  Filled 2022-04-27 (×4): qty 1

## 2022-04-27 MED ORDER — INSULIN GLARGINE-YFGN 100 UNIT/ML ~~LOC~~ SOLN
30.0000 [IU] | Freq: Every day | SUBCUTANEOUS | Status: DC
  Filled 2022-04-27: qty 0.3

## 2022-04-27 NOTE — ED Notes (Signed)
Pt ambulated to restroom with assistance, unable to obtain urine sample as pt had stool in the container.

## 2022-04-27 NOTE — ED Notes (Signed)
Pt was given cracker peanut butter and ginger ale after the bgl of 68, bgl has since improved and insulin was help

## 2022-04-27 NOTE — ED Triage Notes (Signed)
Pt BIB [facility] from doctor's office due to hypotension of 79/40 with dizziness.  Pt was at doctor's office about cloudy urine for the past week.  Pt does have pacemaker and hx CHF.  NS given en route.  20g right hand.

## 2022-04-27 NOTE — ED Provider Notes (Signed)
[place] EMERGENCY DEPARTMENT AT [facility] Provider Note   CSN: [id number] Arrival date & time: 04/27/22  1450     History  Chief Complaint  Patient presents with   Hypotension    [name] is Tran 72 y.o. female.  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  She was given 850 mL normal saline from EMS with improvement.  Patient went to her doctor's office today for routine checkup.  She said she was going to get checked for urinary tract infection as she has been having symptoms for the past week with burning urine and cloudy urine as well as routine checkup for labs with hemoglobin A1c and checking her blood pressure.  When she got to the doctors offers she said she was feeling very lightheaded and faint.  She never did faint.  She has had no chest pain or shortness of breath.  Her weight has been steady at 234 pounds.  She denies any increased fluid in her legs.  She is on Lasix daily and took her 2 different blood pressure medications this morning before her checkup.  She has been complaining of generalized weakness no localizing weakness numbness or tingling.  No facial droop no slurred speech.  No recent falls or head injuries.  No bloody stools or melena.  No vomiting or diarrhea but does endorse decreased p.o. intake. HPI     Home Medications Prior to Admission medications   Medication Sig Start Date End Date Taking? Authorizing Provider  albuterol (PROVENTIL) (2.5 MG/3ML) 0.083% nebulizer solution Take 2.5 mg by nebulization every 6 (six) hours as needed for wheezing or shortness of breath.  09/30/15   [provider]  Alcohol Swabs (ALCOHOL WIPES) 70 % PADS DropSafe Alcohol Prep Pads  USE WHEN CHECKING BLOOD SUGARS    [provider]  amiodarone (PACERONE) 200 MG tablet Take 2  tablets ( ) by mouth daily for one week, then reduce to 1 tablet ( ) daily. 02/07/22   Jamie Prude, MD  carvedilol (COREG) 12.5 MG tablet Take 1 tablet (12.5 mg total) by mouth 2 (two) times daily with Tran meal. 02/07/22   Jamie Prude, MD  Continuous Blood Gluc Receiver (DEXCOM G7 RECEIVER) DEVI 1 Device by Does not apply route as directed. 11/03/21   Shamleffer, Konrad Dolores, MD  Continuous Blood Gluc Sensor (DEXCOM G7 SENSOR) MISC 1 Device by Does not apply route as directed. 11/03/21   Shamleffer, Konrad Dolores, MD  cyanocobalamin (,VITAMIN B-12,) 1000 MCG/ML injection 1,000 mcg every 30 (thirty) days.    [provider]  dapagliflozin propanediol (FARXIGA) 10 MG TABS tablet Take 1 tablet (10 mg total) by mouth daily. 11/03/21   Shamleffer, Konrad Dolores, MD  ELIQUIS 5 MG TABS tablet TAKE 1 TABLET TWICE DAILY 01/10/22   Jamie Harman, MD  ferrous sulfate 325 (65 FE) MG EC tablet Take 325 mg by mouth every other day.    [provider]  furosemide (LASIX) 40 MG tablet Take 1 tablet (40 mg total) by mouth daily. 01/26/22 04/26/22  Jamie Lis M, PA-C  gabapentin (NEURONTIN) 300 MG capsule Take 300 mg by mouth at bedtime. 12/23/20   [provider]  glucose blood (ACCU-CHEK GUIDE) test strip Check 3 times daily 02/22/22   Shamleffer, Konrad Dolores, MD  insulin degludec (TRESIBA FLEXTOUCH) 100 UNIT/ML FlexTouch  Pen Inject 30 Units into the skin daily. 11/03/21   Shamleffer, Konrad Dolores, MD  Insulin Pen Needle 31G X 5 MM MISC 1 Device by Does not apply route in the morning, at noon, in the evening, and at bedtime. 11/03/21   Shamleffer, Konrad Dolores, MD  isosorbide mononitrate (IMDUR) 30 MG 24 hr tablet Take 30 mg by mouth daily. 02/14/21   [provider]  latanoprost (XALATAN) 0.005 % ophthalmic solution Place 1 drop into both eyes at bedtime.    [provider]  nitroGLYCERIN (NITROSTAT) 0.4 MG SL tablet Place  1 tablet (0.4 mg total) under the tongue every 5 (five) minutes as needed for chest pain. 04/18/22   Jamie Bathe, MD  NOVOLOG FLEXPEN 100 UNIT/ML FlexPen INJECT PER SLIDING SCALE AS DIRECTED , MAX 30 UNITS PER DAY. DISCARD PEN 28 DAYS AFTER OPENING Patient taking differently: Inject 10-15 Units into the skin 3 (three) times daily with meals. Per sliding scale 10/11/21   Shamleffer, Konrad Dolores, MD  potassium chloride SA (KLOR-CON) 20 MEQ tablet Take 20 mEq by mouth daily as needed (for low pottassium). 02/23/20   [provider]  rosuvastatin (CRESTOR) 20 MG tablet Take 1 tablet (20 mg total) by mouth daily at 6 PM. 10/07/15   Jamie Bustard, MD  sacubitril-valsartan (ENTRESTO) 24-26 MG Take 1 tablet by mouth 2 (two) times daily. 02/07/22   Jamie Prude, MD  spironolactone (ALDACTONE) 25 MG tablet Take 0.5 tablets (12.5 mg total) by mouth daily. 01/31/22   Jamie Prude, MD  TRUE METRIX BLOOD GLUCOSE TEST test strip TEST THREE TIMES DAILY 12/11/21   Shamleffer, Konrad Dolores, MD  Vitamin D, Ergocalciferol, (DRISDOL) 1.25 MG (50000 UT) CAPS capsule Take 1 capsule (50,000 Units total) by mouth every 7 (seven) days. 12/30/18   Jamie Capra A, DO      Allergies    Metformin and related, Ozempic (0.25 or 0.5 mg-dose) [semaglutide(0.25 or 0.5mg -dos)], Rosuvastatin, Atorvastatin calcium, and Levofloxacin    Review of Systems   Review of Systems  Physical Exam Updated Vital Signs BP 126/63   Pulse (!) 50   Temp 98.2 F (36.8 C) (Oral)   Resp 15   Ht 5\' 1"  (1.549 Tran)   Wt 106.1 kg   SpO2 100%   BMI 44.21 kg/Tran  Physical Exam Constitutional: Alert and orientedx4.  Laying in bed with eyes closed appears fatigued but nontoxic Eyes: Conjunctivae are normal. ENT      Head: Normocephalic and atraumatic. Cardiovascular: Bradycardic, palpable bilateral radial pulses Respiratory: Normal respiratory effort. Breath sounds are normal.  O2 sat 98 on RA Gastrointestinal: Soft  and nontender.  No rebound, no guarding Musculoskeletal: Normal range of motion in all extremities. Bilateral compression stockings present, no pitting edema present Neurologic: Normal speech and language.  GCS 15.  5 out of 5 strength bilateral upper extremities.  4+ out of 5 strength bilateral lower extremities.  Sensation grossly intact.  No facial droop.  No gross focal neurologic deficits are appreciated. Skin: Skin is warm, dry Psychiatric: Mood and affect are normal. Speech and behavior are normal.  ED Results / Procedures / Treatments   Labs (all labs ordered are listed, but only abnormal results are displayed) Labs Reviewed  COMPREHENSIVE METABOLIC PANEL - Abnormal; Notable for the following components:      Result Value   Potassium 5.3 (*)    BUN 24 (*)    Creatinine, Ser 1.42 (*)    Calcium 8.3 (*)  Albumin 2.9 (*)    AST 57 (*)    Total Bilirubin 2.3 (*)    GFR, Estimated 39 (*)    All other components within normal limits  CBC WITH DIFFERENTIAL/PLATELET - Abnormal; Notable for the following components:   Platelets 147 (*)    All other components within normal limits  URINALYSIS, W/ REFLEX TO CULTURE (INFECTION SUSPECTED) - Abnormal; Notable for the following components:   APPearance CLOUDY (*)    Glucose, UA >=500 (*)    Hgb urine dipstick MODERATE (*)    Nitrite POSITIVE (*)    Leukocytes,Ua LARGE (*)    Bacteria, UA MANY (*)    All other components within normal limits  CBC - Abnormal; Notable for the following components:   Platelets 116 (*)    All other components within normal limits  CREATININE, SERUM - Abnormal; Notable for the following components:   Creatinine, Ser 1.31 (*)    GFR, Estimated 43 (*)    All other components within normal limits  CBG MONITORING, ED - Abnormal; Notable for the following components:   Glucose-Capillary 68 (*)    All other components within normal limits  CBG MONITORING, ED - Abnormal; Notable for the following components:    Glucose-Capillary 110 (*)    All other components within normal limits  CBG MONITORING, ED - Abnormal; Notable for the following components:   Glucose-Capillary 180 (*)    All other components within normal limits  URINE CULTURE  BRAIN NATRIURETIC PEPTIDE  LACTIC ACID, PLASMA  MAGNESIUM  CBC WITH DIFFERENTIAL/PLATELET  COMPREHENSIVE METABOLIC PANEL  TROPONIN I (HIGH SENSITIVITY)    EKG EKG Interpretation  Date/Time:  Friday April 27 2022 15:00:45 EDT Ventricular Rate:  50 PR Interval:  120 QRS Duration: 229 QT Interval:  658 QTC Calculation: 601 R Axis:   -78 Text Interpretation: Sinus rhythm Paired ventricular premature complexes Nonspecific IVCD with LAD LVH with secondary repolarization abnormality Rate slower Confirmed by Vivien Rossetti ([id number]) on 04/27/2022 3:03:46 PM  Radiology CT Renal Stone Study  Result Date: 04/27/2022 CLINICAL DATA:  Hypotension and dizziness.  Cloudy urine. EXAM: CT ABDOMEN AND PELVIS WITHOUT CONTRAST TECHNIQUE: Multidetector CT imaging of the abdomen and pelvis was performed following the standard protocol without IV contrast. RADIATION DOSE REDUCTION: This exam was performed according to the departmental dose-optimization program which includes automated exposure control, adjustment of the mA and/or kV according to patient size and/or use of iterative reconstruction technique. COMPARISON:  CT abdomen and pelvis 10/30/2021 FINDINGS: Lower chest: Bibasilar atelectasis/scarring. Cardiomegaly. Partially visualized ICD. Hepatobiliary: Cholelithiasis without evidence of cholecystitis. Unremarkable liver. Pancreas: Unremarkable. Spleen: Unremarkable. Adrenals/Urinary Tract: Stable adrenal glands. Nonobstructing stone in the lower pole of the right kidney measuring 8 mm. No obstructing urinary calculi or hydronephrosis. Unremarkable bladder. Stomach/Bowel: Normal caliber large and small bowel. Normal appendix containing an appendicolith. Stomach is within  normal limits. Small hiatal hernia. Vascular/Lymphatic: Extensive arterial vascular calcifications. Iliac artery stents bilaterally. Left common iliac vein stent. No lymphadenopathy. Reproductive: Uterus is unremarkable. Unchanged 5.5 x 5.6 cm right adnexal cyst. Other: No free intraperitoneal fluid or air. Ventral abdominal wall fat containing hernia Musculoskeletal: Chronic compression deformities of L2, L3, L4. Thoracolumbar spondylosis. No acute abnormality. IMPRESSION: 1. Nonobstructing right nephrolithiasis no obstructing urinary calculi or hydronephrosis. 2. Cholelithiasis without evidence of cholecystitis. 3. Unchanged right adnexal cyst measuring up to 5.6 cm. This has slowly increased in size from 11/30/2020 concerning for low-grade cystic neoplasm. 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 Recommend GYN consult,  and consider pelvis MRI w/o and w/ contrast if clinically warranted. Note: This recommendation does not apply to premenarchal patients and to those with increased risk (genetic, family history, elevated tumor markers or other high-risk factors) of ovarian cancer. Reference: JACR 2020 Feb; 17(2):248-254 4.  Aortic Atherosclerosis (ICD10-I70.0). Electronically Signed   By: Minerva Fester Tran.D.   On: 04/27/2022 18:16   DG Chest 2 View  Result Date: 04/27/2022 CLINICAL DATA:  Hypertension and dizziness today.  History of CHF. EXAM: CHEST - 2 VIEW COMPARISON:  Chest radiographs 01/12/2022 and 10/29/2021 FINDINGS: Status post median sternotomy and CABG. Cardiac silhouette is again moderately enlarged. Moderate calcification within the aortic arch. Mildly decreased lung volumes. Bibasilar linear densities are slightly decreased from prior, likely subsegmental atelectasis and/or scarring. No definite pleural effusion. No pneumothorax. Moderate multilevel degenerative disc changes of the thoracic spine. IMPRESSION: 1. Mildly decreased lung volumes and bibasilar subsegmental atelectasis and/or  scarring. Overall the lower lung aeration is mildly improved compared to the most recent 01/12/2022 radiograph. 2. Stable cardiomegaly. Electronically Signed   By: Neita Garnet Tran.D.   On: 04/27/2022 15:48    Procedures Procedures    Medications Ordered in ED Medications  sodium zirconium cyclosilicate (LOKELMA) packet 10 g (10 g Oral Patient Refused/Not Given 04/28/22 0054)  apixaban (ELIQUIS) tablet 5 mg (0 mg Oral Hold 04/27/22 2208)  gabapentin (NEURONTIN) capsule 300 mg (300 mg Oral Given 04/27/22 2336)  isosorbide mononitrate (IMDUR) 24 hr tablet 30 mg (has no administration in time range)  rosuvastatin (CRESTOR) tablet 20 mg (has no administration in time range)  sacubitril-valsartan (ENTRESTO) 24-26 mg per tablet (has no administration in time range)  insulin glargine-yfgn (SEMGLEE) injection 30 Units (has no administration in time range)  insulin aspart (novoLOG) injection 0-15 Units (has no administration in time range)  insulin aspart (novoLOG) injection 0-5 Units (0 Units Subcutaneous Hold 04/27/22 2339)  albuterol (PROVENTIL) (2.5 MG/3ML) 0.083% nebulizer solution 2.5 mg (has no administration in time range)  cefTRIAXone (ROCEPHIN) 1 g in sodium chloride 0.9 % 100 mL IVPB (has no administration in time range)  amiodarone (PACERONE) tablet 200 mg (has no administration in time range)  acetaminophen (TYLENOL) tablet 650 mg (has no administration in time range)    Or  acetaminophen (TYLENOL) suppository 650 mg (has no administration in time range)  senna-docusate (Senokot-S) tablet 1 tablet (has no administration in time range)  sodium chloride 0.9 % bolus 500 mL (0 mLs Intravenous Stopped 04/27/22 1820)  cefTRIAXone (ROCEPHIN) 1 g in sodium chloride 0.9 % 100 mL IVPB (0 g Intravenous Stopped 04/27/22 2043)    ED Course/ Medical Decision Making/ Tran&P                             Medical Decision Making  Dollie Aricela Bertagnolli is Tran 72 y.o. female.  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  She was given 850 mL normal saline from EMS with improvement. No syncopal episode.  On chart review, patient's last follow-up with cardiology 04/24/2022, ICD/pacemaker interrogation was reassuring but did show evidence of patient being hypovolemic.  She was advised to reduce her Lasix dosing to once daily and to increase fluid intake to 64 ounces daily.  Exam today, patient appears euvolemic.  She is bradycardic at 50s.  Base rate  of pacemaker is 50.  She is denying any chest pain and there are no acute ischemic changes on EKG or evidence of Sgarbossa, doubt ACS.  Suspect patient's presentation could be multifactorial including decreased p.o. intake, diuresis, medication/antihypertensive use and possible UTI.  Blood pressure on presentation 118/56 improved from office presentation after 850 cc fluids.  She is globally weak with no localizing deficits, no concern for CVA or neurologic cause of symptoms.   Labs reviewed normal white blood cell count 4.9 no left shift doubt sepsis.  Normal hemoglobin 12.6 without any complaints of bleeding, no concern for hemorrhage. Cr 1.42 and K 5.3 - no EKG change,suspect hypovolemic, started on another 500cc IV fluids and Lokelma.  UA c/w UTI started on IV rocephin. CT renal stone study personally reviewed by me without evidence of obstructing stones.  D/w Dr Joneen Roach for admission for UTI and AKI;    Amount and/or Complexity of Data Reviewed Labs: ordered. Radiology: ordered.  Risk Prescription drug management. Decision regarding hospitalization.     Final Clinical Impression(s) / ED Diagnoses Final diagnoses:  Hypotension, unspecified hypotension type  AKI (acute kidney injury)  Hyperkalemia  Urinary tract infection without hematuria, site unspecified    Rx / DC Orders ED  Discharge Orders     None         Mardene Sayer, MD 04/28/22 0150

## 2022-04-27 NOTE — H&P (Incomplete)
PCP:   Marva Panda, NP   Chief Complaint:  Low blood pressure  HPI: 72 year old female with past medical history of HFpEF 25-30%, sp ICD placement, atrial fibrillation, CAD, chronic respiratory failure 3 L, asthma, HLD, HTN, prolonged QTc, and right adnexal cyst.  For the past 3-4 days she has been feeling occasionally lightheaded and off.  Her son checked her blood pressure last night and her systolic blood pressure ranged in the high 90s to low 100.  She drank some water, her BP improved slightly.  Her dizziness resolved.  Today she had a PCP appointment, she took her AM medications prior to going.  In the doctor's office waiting area, patient became dazed and confused.  She was poorly interactive with her son.  He alerted the nursing staff.  Her blood pressure check was 78/40 and 83/41.  She was transported to the ER.  The patient is maintained on Lasix for her HFrEF. She had been instructed by PCP to take an extra dose of Lasix for 3 days for mild fluid overload.  The patient took the twice daily dose for over a week before returning to her normal dose of Lasix 40 mg daily.  Patient denies fever, chills, vomiting, shortness of breath or wheeze.  She endorses some nausea, spitting up, BOU, chronic dry cough, lightheadedness, dizziness, altered mentation a doctor's office.  In the ambulance patient was given a 850cc NS.  In the ER an additional 500cc NS given.  Patient's BP has normalized.  Patient's UA suggestive of UTI.  Potassium 4.3, creatinine 1.42, up from 0.9[01/17/2022.  Lokelma given by EDP.  History provided by son and daughter both present at bedside.  Review of Systems:  The patient denies anorexia, fever, weight loss,, vision loss, decreased hearing, hoarseness, chest pain, syncope, dyspnea on exertion, peripheral edema, balance deficits, hemoptysis, abdominal pain, melena, hematochezia, severe indigestion/heartburn, hematuria, incontinence, genital sores, muscle weakness,  suspicious skin lesions, transient blindness, difficulty walking, depression, unusual weight change, abnormal bleeding, enlarged lymph nodes, angioedema, and breast masses. Positives: Nausea, spitting up, BOU, chronic dry cough, lightheadedness, dizziness, altered mentation   Past Medical History: Past Medical History:  Diagnosis Date   Abnormal LFTs    AICD (automatic cardioverter/defibrillator) present    Angina decubitus 11/06/2012   Angina pectoris    Anxiety    Arthritis    Back pain    Bundle branch block 05/2016   CAD (coronary artery disease) 06/18/2010   CHF (congestive heart failure)    Chronic combined systolic and diastolic CHF, NYHA class 3    Chronic combined systolic and diastolic heart failure    Chronic systolic dysfunction of left ventricle 09/24/2012   Coronary artery disease    s/p CABG 2007   Depression    Dermal mycosis    Diabetes mellitus    type II   DKA (diabetic ketoacidoses) 01/12/2019   DVT (deep venous thrombosis) 09/2015   bilateral   Edema, lower extremity    Glaucoma    Hyperlipidemia    Hypertension    Hypoglycemia 09/09/2012   Insomnia    Insulin dependent diabetes mellitus (HCC) 04/23/2012   Ischemic cardiomyopathy    EF 30-35%, s/p ICD 4/08 by Dr Amil Amen   Joint pain    LBBB (left bundle branch block) 09/24/2012   Morbid obesity 11/06/2012   Obesity    Oxygen dependent 2 L   Pain in left knee    Peripheral neuropathy    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  LIMA to D2, SVG to ramus intermediate, sequential SVG to OM1-OM2, SVG to RCA with EVH via both legs    Sleep apnea    uses O2 at night   Tinea    Type 2 diabetes mellitus with hyperglycemia, with long-term current use of insulin 04/20/2019   Uncontrolled diabetes mellitus 02/2019   Ventricular tachycardia 01/16/15   sustained VT terminated with ATP, CL 340 msec   Vitamin B 12 deficiency 11/05/2018   Vitamin D deficiency 11/05/2018   Past Surgical History:  Procedure Laterality Date    BI-VENTRICULAR IMPLANTABLE CARDIOVERTER DEFIBRILLATOR UPGRADE N/A 09/25/2012   Procedure: BI-VENTRICULAR IMPLANTABLE CARDIOVERTER DEFIBRILLATOR UPGRADE;  Surgeon: Gardiner Rhyme, MD;  Location: [facility] CATH LAB;  Service: Cardiovascular;  Laterality: N/A;   BREAST BIOPSY Right 04/05/2014   CARDIAC CATHETERIZATION     CARDIAC DEFIBRILLATOR PLACEMENT  4/08   by Dr Amil Amen (MDT)   COLONOSCOPY WITH PROPOFOL N/A 10/12/2016   Procedure: COLONOSCOPY WITH PROPOFOL;  Surgeon: Sherrilyn Rist, MD;  Location: [facility] ENDOSCOPY;  Service: Gastroenterology;  Laterality: N/A;   CORONARY ARTERY BYPASS GRAFT  10/05/2005   by Dr Laneta Simmers   IMPLANTABLE CARDIOVERTER DEFIBRILLATOR IMPLANT  09/25/12   attempt of upgrade to CRT-D unsuccessful due to CS anatomy, SJM Unify Asaura device placed with LV port capped by Dr Johney Frame   IR GENERIC HISTORICAL  10/03/2015   IR US GUIDE VASC ACCESS LEFT 10/03/2015 Malachy Moan, MD [facility]   IR GENERIC HISTORICAL  10/03/2015   IR VENO/EXT/UNI LEFT 10/03/2015 Malachy Moan, MD [facility]   IR GENERIC HISTORICAL  10/03/2015   IR VENOCAVAGRAM IVC 10/03/2015 Malachy Moan, MD [facility]   IR GENERIC HISTORICAL  10/03/2015   IR INFUSION THROMBOL VENOUS INITIAL (MS) 10/03/2015 Malachy Moan, MD [facility]   IR GENERIC HISTORICAL  10/03/2015   IR US GUIDE VASC ACCESS LEFT 10/03/2015 Malachy Moan, MD [facility]   IR GENERIC HISTORICAL  10/04/2015   IR THROMBECT VENO MECH MOD SED 10/04/2015 Simonne Come, MD [facility]   IR GENERIC HISTORICAL  10/04/2015   IR TRANSCATH PLC STENT 1ST ART NOT LE CV CAR VERT CAR 10/04/2015 Simonne Come, MD [facility]   IR GENERIC HISTORICAL  10/04/2015   IR THROMB F/U EVAL ART/VEN FINAL DAY (MS) 10/04/2015 Simonne Come, MD [facility]   IR GENERIC HISTORICAL  11/02/2015   IR RADIOLOGIST EVAL & MGMT 11/02/2015 Simonne Come, MD [facility]   IR RADIOLOGIST EVAL & MGMT  04/26/2016   LEFT HEART CATH AND CORS/GRAFTS ANGIOGRAPHY N/A 01/19/2019    Procedure: LEFT HEART CATH AND CORS/GRAFTS ANGIOGRAPHY;  Surgeon: Lyn Records, MD;  Location: [facility];  Service: Cardiovascular;  Laterality: N/A;   LEFT HEART CATH AND CORS/GRAFTS ANGIOGRAPHY N/A 01/15/2022   Procedure: LEFT HEART CATH AND CORS/GRAFTS ANGIOGRAPHY;  Surgeon: Runell Gess, MD;  Location: [facility] CV LAB;  Service: Cardiovascular;  Laterality: N/A;    Medications: Prior to Admission medications   Medication Sig Start Date End Date Taking? Authorizing Provider  albuterol (PROVENTIL) (2.5 MG/3ML) 0.083% nebulizer solution Take 2.5 mg by nebulization every 6 (six) hours as needed for wheezing or shortness of breath.  09/30/15   [provider]  Alcohol Swabs (ALCOHOL WIPES) 70 % PADS DropSafe Alcohol Prep Pads  USE WHEN CHECKING BLOOD SUGARS    [provider]  amiodarone (PACERONE) 200 MG tablet Take 2 tablets ( ) by mouth daily for one week, then reduce to 1 tablet ( )  daily. 02/07/22   Lanier Prude, MD  carvedilol (COREG) 12.5 MG tablet Take 1 tablet (12.5 mg total) by mouth 2 (two) times daily with a meal. 02/07/22   Lanier Prude, MD  Continuous Blood Gluc Receiver (DEXCOM G7 RECEIVER) DEVI 1 Device by Does not apply route as directed. 11/03/21   Shamleffer, Konrad Dolores, MD  Continuous Blood Gluc Sensor (DEXCOM G7 SENSOR) MISC 1 Device by Does not apply route as directed. 11/03/21   Shamleffer, Konrad Dolores, MD  cyanocobalamin (,VITAMIN B-12,) 1000 MCG/ML injection 1,000 mcg every 30 (thirty) days.    [provider]  dapagliflozin propanediol (FARXIGA) 10 MG TABS tablet Take 1 tablet (10 mg total) by mouth daily. 11/03/21   Shamleffer, Konrad Dolores, MD  ELIQUIS 5 MG TABS tablet TAKE 1 TABLET TWICE DAILY 01/10/22   [name], MD  ferrous sulfate 325 (65 FE) MG EC tablet Take 325 mg by mouth every other day.    [provider]  furosemide (LASIX) 40 MG tablet Take 1 tablet (40 mg total)  by mouth daily. 01/26/22 04/26/22  Robbie Lis M, PA-C  gabapentin (NEURONTIN) 300 MG capsule Take 300 mg by mouth at bedtime. 12/23/20   [provider]  glucose blood (ACCU-CHEK GUIDE) test strip Check 3 times daily 02/22/22   Shamleffer, Konrad Dolores, MD  insulin degludec (TRESIBA FLEXTOUCH) 100 UNIT/ML FlexTouch Pen Inject 30 Units into the skin daily. 11/03/21   Shamleffer, Konrad Dolores, MD  Insulin Pen Needle 31G X 5 MM MISC 1 Device by Does not apply route in the morning, at noon, in the evening, and at bedtime. 11/03/21   Shamleffer, Konrad Dolores, MD  isosorbide mononitrate (IMDUR) 30 MG 24 hr tablet Take 30 mg by mouth daily. 02/14/21   [provider]  latanoprost (XALATAN) 0.005 % ophthalmic solution Place 1 drop into both eyes at bedtime.    [provider]  nitroGLYCERIN (NITROSTAT) 0.4 MG SL tablet Place 1 tablet (0.4 mg total) under the tongue every 5 (five) minutes as needed for chest pain. 04/18/22   Jake Bathe, MD  NOVOLOG FLEXPEN 100 UNIT/ML FlexPen INJECT PER SLIDING SCALE AS DIRECTED , MAX 30 UNITS PER DAY. DISCARD PEN 28 DAYS AFTER OPENING Patient taking differently: Inject 10-15 Units into the skin 3 (three) times daily with meals. Per sliding scale 10/11/21   Shamleffer, Konrad Dolores, MD  potassium chloride SA (KLOR-CON) 20 MEQ tablet Take 20 mEq by mouth daily as needed (for low pottassium). 02/23/20   [provider]  rosuvastatin (CRESTOR) 20 MG tablet Take 1 tablet (20 mg total) by mouth daily at 6 PM. 10/07/15   Alm Bustard, MD  sacubitril-valsartan (ENTRESTO) 24-26 MG Take 1 tablet by mouth 2 (two) times daily. 02/07/22   Lanier Prude, MD  spironolactone (ALDACTONE) 25 MG tablet Take 0.5 tablets (12.5 mg total) by mouth daily. 01/31/22   Lanier Prude, MD  TRUE METRIX BLOOD GLUCOSE TEST test strip TEST THREE TIMES DAILY 12/11/21   Shamleffer, Konrad Dolores, MD  Vitamin D, Ergocalciferol, (DRISDOL) 1.25 MG  (50000 UT) CAPS capsule Take 1 capsule (50,000 Units total) by mouth every 7 (seven) days. 12/30/18   Roswell Nickel, DO    Allergies:   Allergies  Allergen Reactions   Metformin And Related Itching, Swelling and Other (See Comments)    Leg pain & swelling in legs   Ozempic (0.25 Or 0.5 Mg-Dose) [Semaglutide(0.25 Or 0.5mg -Dos)] Other (See Comments)    Pt with  hx of pancreatitis    Rosuvastatin    Atorvastatin Calcium Itching and Rash   Levofloxacin Itching    Social History:  reports that she has never smoked. She has never used smokeless tobacco. She reports that she does not drink alcohol and does not use drugs.  Family History: Family History  Problem Relation Age of Onset   Diabetes Mother 66       died - HTN   Stroke Mother    High blood pressure Mother    Sudden death Mother    Obesity Mother    Other Other        No early family hx of CAD    Physical Exam: Vitals:   04/27/22 1830 04/27/22 1831 04/27/22 1900 04/27/22 1915  BP: 113/65 113/65 109/65 108/65  Pulse: (!) 50 (!) 50 (!) 49 (!) 50  Resp: Temp:  98 F (36.7 C)    TempSrc:  Axillary    SpO2: 99% 97% 100% 100%  Weight:      Height:        General:  Alert and oriented times three, well developed, elderly, weak, chronically unwell appearing female Eyes: PERRLA, pink conjunctiva, no scleral icterus ENT: Moist oral mucosa, neck supple, no thyromegaly Lungs: clear to ascultation, no wheeze, no crackles, no use of accessory muscles Cardiovascular: regular rate and rhythm, no regurgitation, no gallops, no murmurs. No carotid bruits, no JVD Abdomen: soft, positive BS, non-tender, non-distended, no organomegaly, not an acute abdomen GU: not examined Neuro: CN II - XII grossly intact, sensation intact Musculoskeletal: strength 5/5 all extremities, no clubbing, cyanosis or edema Skin: no rash, no subcutaneous crepitation, no decubitus Psych: appropriate patient   Labs on Admission:  Recent  Labs    04/27/22 1511  NA 140  K 5.3*  CL 109  CO2 24  GLUCOSE 88  BUN 24*  CREATININE 1.42*  CALCIUM 8.3*   Recent Labs    04/27/22 1511  AST 57*  ALT 23  ALKPHOS 70  BILITOT 2.3*  PROT 6.5  ALBUMIN 2.9*    Recent Labs    04/27/22 1511  WBC 4.9  NEUTROABS 2.1  HGB 12.6  HCT 38.6  MCV 93.0  PLT 147*    Micro Results: Recent Results (from the past 240 hour(s))  Urine Culture     Status: None (Preliminary result)   Collection Time: 04/27/22  3:11 PM   Specimen: Urine, Clean Catch  Result Value Ref Range Status   Specimen Description URINE, CLEAN CATCH  Final   Special Requests   Final    NONE Reflexed from [id number] Performed at [facility], [address] [place], [place] [address]    Culture PENDING  Incomplete   Report Status PENDING  Incomplete     Radiological Exams on Admission: CT Renal Stone Study  Result Date: 04/27/2022 CLINICAL DATA:  Hypotension and dizziness.  Cloudy urine. EXAM: CT ABDOMEN AND PELVIS WITHOUT CONTRAST TECHNIQUE: Multidetector CT imaging of the abdomen and pelvis was performed following the standard protocol without IV contrast. RADIATION DOSE REDUCTION: This exam was performed according to the departmental dose-optimization program which includes automated exposure control, adjustment of the mA and/or kV according to patient size and/or use of iterative reconstruction technique. COMPARISON:  CT abdomen and pelvis 10/30/2021 FINDINGS: Lower chest: Bibasilar atelectasis/scarring. Cardiomegaly. Partially visualized ICD. Hepatobiliary: Cholelithiasis without evidence of cholecystitis. Unremarkable liver. Pancreas: Unremarkable. Spleen: Unremarkable. Adrenals/Urinary Tract: Stable adrenal glands. Nonobstructing stone in the  lower pole of the right kidney measuring 8 mm. No obstructing urinary calculi or hydronephrosis. Unremarkable bladder. Stomach/Bowel: Normal caliber large and small bowel. Normal appendix containing an appendicolith.  Stomach is within normal limits. Small hiatal hernia. Vascular/Lymphatic: Extensive arterial vascular calcifications. Iliac artery stents bilaterally. Left common iliac vein stent. No lymphadenopathy. Reproductive: Uterus is unremarkable. Unchanged 5.5 x 5.6 cm right adnexal cyst. Other: No free intraperitoneal fluid or air. Ventral abdominal wall fat containing hernia Musculoskeletal: Chronic compression deformities of L2, L3, L4. Thoracolumbar spondylosis. No acute abnormality. IMPRESSION: 1. Nonobstructing right nephrolithiasis no obstructing urinary calculi or hydronephrosis. 2. Cholelithiasis without evidence of cholecystitis. 3. Unchanged right adnexal cyst measuring up to 5.6 cm. This has slowly increased in size from 11/30/2020 concerning for low-grade cystic neoplasm. 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 Recommend GYN consult, and consider pelvis MRI w/o and w/ contrast if clinically warranted. Note: This recommendation does not apply to premenarchal patients and to those with increased risk (genetic, family history, elevated tumor markers or other high-risk factors) of ovarian cancer. Reference: JACR 2020 Feb; 17(2):248-254 4.  Aortic Atherosclerosis (ICD10-I70.0). Electronically Signed   By: Minerva Fester M.D.   On: 04/27/2022 18:16   DG Chest 2 View  Result Date: 04/27/2022 CLINICAL DATA:  Hypertension and dizziness today.  History of CHF. EXAM: CHEST - 2 VIEW COMPARISON:  Chest radiographs 01/12/2022 and 10/29/2021 FINDINGS: Status post median sternotomy and CABG. Cardiac silhouette is again moderately enlarged. Moderate calcification within the aortic arch. Mildly decreased lung volumes. Bibasilar linear densities are slightly decreased from prior, likely subsegmental atelectasis and/or scarring. No definite pleural effusion. No pneumothorax. Moderate multilevel degenerative disc changes of the thoracic spine. IMPRESSION: 1. Mildly decreased lung volumes and bibasilar subsegmental  atelectasis and/or scarring. Overall the lower lung aeration is mildly improved compared to the most recent 01/12/2022 radiograph. 2. Stable cardiomegaly. Electronically Signed   By: Neita Garnet M.D.   On: 04/27/2022 15:48    Assessment/Plan Present on Admission:  Acute cystitis -Admit to cardiac telemetry -Blood and urine cultures collected -IV Rocephin given by EDP, continued   Acute kidney injury superimposed on chronic kidney disease, stage II -IV fluid hydration given.  Will give an additional 250 bolus.  Then monitor -Patient's Coreg,  Hyperkalemia -Secondary to potassium supplementation and diuresis -Hold p.o. potassium.   Hypotension -Multifactorial etiology including infection/cystitis, diuresis, -Gently repleted in the ER.  Monitor.  Orthostatic vitals in a.m. -Patient's Lasix and Aldactone on hold, will likely resume in a.m.  Additionally, patient's Entresto, Coreg, Imdur   Bradycardia -Heart rate 50 during consult.  Patient asymptomatic.   -Coreg on hold.  Amiodarone to start in AM, decreased to 200mg  daily   Prolonged QTc ->600.  ICD in place -Goal potassium 4, goal magnesium 2 -Cardiology consult placed re: prolonged QTc, relative bradycardia & amiodarone 400 mg daily.   CAD (coronary artery disease)  Chronic combined systolic and diastolic heart failure, EF25-30% -Stable, see above   Right adnexal cyst  -measuring up to 5.6 cm, slowly increased in size from 11/30/2020. Concerning for low-grade cystic neoplasm.  -Patient follows with gyn [place]: Per GYN note: She remains asymptomatic. We discussed that her EF was 30% and so there would be higher surgical risk. We reviewed that if persistent ORADS3 we would plan to RTC 3 months with TVUS prior to continue addressing risks and benefits of surgical removal. If ORADS4 will need surgery given recent growth.     Chronic hypoxemic respiratory failure, 3L -Oxygen  ordered   Asthma -   Hyperlipidemia -Crestor  reordered   Hypertension -Currently borderline hypotensive, stable  Zenobia Kuennen 04/27/2022, 8:23 PM

## 2022-04-28 DIAGNOSIS — Z95810 Presence of automatic (implantable) cardiac defibrillator: Secondary | ICD-10-CM

## 2022-04-28 DIAGNOSIS — I2510 Atherosclerotic heart disease of native coronary artery without angina pectoris: Secondary | ICD-10-CM | POA: Diagnosis not present

## 2022-04-28 DIAGNOSIS — R9431 Abnormal electrocardiogram [ECG] [EKG]: Secondary | ICD-10-CM

## 2022-04-28 DIAGNOSIS — N3000 Acute cystitis without hematuria: Secondary | ICD-10-CM | POA: Diagnosis not present

## 2022-04-28 LAB — CBC
HCT: 39.2 % (ref 36.0–46.0)
Hemoglobin: 12.7 g/dL (ref 12.0–15.0)
MCH: 30.2 pg (ref 26.0–34.0)
MCHC: 32.4 g/dL (ref 30.0–36.0)
MCV: 93.1 fL (ref 80.0–100.0)
Platelets: 116 10*3/uL — ABNORMAL LOW (ref 150–400)
RBC: 4.21 MIL/uL (ref 3.87–5.11)
RDW: 15 % (ref 11.5–15.5)
WBC: 5.6 10*3/uL (ref 4.0–10.5)
nRBC: 0 % (ref 0.0–0.2)

## 2022-04-28 LAB — CBC WITH DIFFERENTIAL/PLATELET
Abs Immature Granulocytes: 0.01 10*3/uL (ref 0.00–0.07)
Basophils Absolute: 0 10*3/uL (ref 0.0–0.1)
Basophils Relative: 1 %
Eosinophils Absolute: 0.1 10*3/uL (ref 0.0–0.5)
Eosinophils Relative: 2 %
HCT: 39 % (ref 36.0–46.0)
Hemoglobin: 12.7 g/dL (ref 12.0–15.0)
Immature Granulocytes: 0 %
Lymphocytes Relative: 34 %
Lymphs Abs: 1.9 10*3/uL (ref 0.7–4.0)
MCH: 30.2 pg (ref 26.0–34.0)
MCHC: 32.6 g/dL (ref 30.0–36.0)
MCV: 92.9 fL (ref 80.0–100.0)
Monocytes Absolute: 0.7 10*3/uL (ref 0.1–1.0)
Monocytes Relative: 12 %
Neutro Abs: 2.8 10*3/uL (ref 1.7–7.7)
Neutrophils Relative %: 51 %
Platelets: 119 10*3/uL — ABNORMAL LOW (ref 150–400)
RBC: 4.2 MIL/uL (ref 3.87–5.11)
RDW: 15 % (ref 11.5–15.5)
WBC: 5.4 10*3/uL (ref 4.0–10.5)
nRBC: 0 % (ref 0.0–0.2)

## 2022-04-28 LAB — COMPREHENSIVE METABOLIC PANEL
ALT: 21 U/L (ref 0–44)
AST: 25 U/L (ref 15–41)
Albumin: 3.1 g/dL — ABNORMAL LOW (ref 3.5–5.0)
Alkaline Phosphatase: 76 U/L (ref 38–126)
Anion gap: 11 (ref 5–15)
BUN: 21 mg/dL (ref 8–23)
CO2: 23 mmol/L (ref 22–32)
Calcium: 8.8 mg/dL — ABNORMAL LOW (ref 8.9–10.3)
Chloride: 103 mmol/L (ref 98–111)
Creatinine, Ser: 1.33 mg/dL — ABNORMAL HIGH (ref 0.44–1.00)
GFR, Estimated: 43 mL/min — ABNORMAL LOW (ref >60)
Glucose, Bld: 156 mg/dL — ABNORMAL HIGH (ref 70–99)
Potassium: 3.3 mmol/L — ABNORMAL LOW (ref 3.5–5.1)
Sodium: 137 mmol/L (ref 135–145)
Total Bilirubin: 0.6 mg/dL (ref 0.3–1.2)
Total Protein: 6.9 g/dL (ref 6.5–8.1)

## 2022-04-28 LAB — CREATININE, SERUM
Creatinine, Ser: 1.31 mg/dL — ABNORMAL HIGH (ref 0.44–1.00)
GFR, Estimated: 43 mL/min — ABNORMAL LOW (ref >60)

## 2022-04-28 LAB — GLUCOSE, CAPILLARY
Glucose-Capillary: 194 mg/dL — ABNORMAL HIGH (ref 70–99)
Glucose-Capillary: 171 mg/dL — ABNORMAL HIGH (ref 70–99)

## 2022-04-28 LAB — CBG MONITORING, ED
Glucose-Capillary: 160 mg/dL — ABNORMAL HIGH (ref 70–99)
Glucose-Capillary: 180 mg/dL — ABNORMAL HIGH (ref 70–99)
Glucose-Capillary: 186 mg/dL — ABNORMAL HIGH (ref 70–99)

## 2022-04-28 LAB — MAGNESIUM: Magnesium: 2.3 mg/dL (ref 1.7–2.4)

## 2022-04-28 MED ORDER — HEPARIN SODIUM (PORCINE) 5000 UNIT/ML IJ SOLN: 5000.0000 [IU] | Freq: Three times a day (TID) | INTRAMUSCULAR | Status: DC

## 2022-04-28 MED ORDER — POTASSIUM CHLORIDE CRYS ER 20 MEQ PO TBCR
20.0000 meq | EXTENDED_RELEASE_TABLET | Freq: Once | ORAL | Status: AC
  Administered 2022-04-28: 20 meq via ORAL
  Filled 2022-04-28: qty 1

## 2022-04-28 MED ORDER — SENNOSIDES-DOCUSATE SODIUM 8.6-50 MG PO TABS: 1.0000 | ORAL_TABLET | Freq: Every evening | ORAL | Status: DC | PRN

## 2022-04-28 MED ORDER — ACETAMINOPHEN 325 MG PO TABS: 650.0000 mg | ORAL_TABLET | Freq: Four times a day (QID) | ORAL | Status: DC | PRN

## 2022-04-28 MED ORDER — ACETAMINOPHEN 650 MG RE SUPP: 650.0000 mg | Freq: Four times a day (QID) | RECTAL | Status: DC | PRN

## 2022-04-28 NOTE — Progress Notes (Signed)
PROGRESS NOTE [name]  ONG:[id number] DOB: 02/01/1950 DOA: 04/27/2022 PCP: Marva Panda, NP  Brief Narrative/Hospital Course: 54 yof w/ HFpEF 25-30%, s/p ICD,atrial fibrillation, CAD, chronic respiratory failure 3 L, asthma, HLD, HTN, prolonged QTc, and right adnexal cyst who for the past 3-4 days has been feeling occasionally lightheaded and off- and her SBP per her son was in the high 90s-100. She drank some water, her BP improved slightly.  Her dizziness resolved. On 4/19 she had a PCP appointment, she took her AM medications prior to going. While waiting became dazed and confused,was poorly interactive.He alerted the nursing staff.  Her blood pressure check was 78/40 and 83/41 so was transported to the ER. The patient is maintained on Lasix for her HFrEF. She had been instructed by PCP to take an extra dose of Lasix for 3 days for mild fluid overload.  The patient took the twice daily dose for over a week before returning to her normal dose of Lasix 40 mg daily. Patient denies fever, chills, vomiting, shortness of breath or wheeze.  She endorses some nausea, spitting up, BOU, chronic dry cough, lightheadedness, dizziness, altered mentation a doctor's office. EMS gave 850 cc NS and additional 500 cc in the ED, BP had normalized, workup showed UA/2 of UTI,K: 4.3, creatinine 1.42 up from 0.9 [01/17/2022.Lokelma given by EDP. 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  She reports her dysuria is improving, blood pressure stable no new complaints Heart rate in 50s, BP 130s, not hypoxic Labs reviewed potassium  slightly low 3.3 creatinine at 1.3 mild thrombocytopenia  Assessment and Plan: Principal Problem:   Acute cystitis Active Problems:   Chronic combined systolic and diastolic heart failure   Acute kidney injury superimposed on chronic kidney disease   CAD (coronary artery disease)   Hypertension   Hypotension   Hyperlipidemia   ICD (implantable cardioverter-defibrillator) in place   Diabetes mellitus   History of DVT (deep vein thrombosis)   Prolonged QT interval   Chronic hypoxemic respiratory failure   Anxiety and depression   Acute cystitis: CT renal study with nonobstructive nephrolithiasis, cholelithiasis.  Continue empiric antibiotics follow-up culture data  AKI on CKD stage II: Continue IV hydration avoid nephrotoxic medication.  Creatinine slowly improving. Recent Labs  Lab 04/27/22 1511 04/28/22 0102 04/28/22 0538  BUN 24*  --  21  CREATININE 1.42* 1.31* 1.33*    Hyperkalemia: Resolved Hypokalemia will replace Diabetes mellitus with hypoglycemia overnight it has improved encourage oral intake hold insulin  Hypotension/soft blood pressure: BP improved with IV fluid.  Check orthostatics she is on Lasix Aldactone and held, also on Entresto Coreg and Imdur continue with holding parameters.  Wide  QRS Prolonged Qtc Bradycardia AICD: Asymptomatic sinus bradycardia, Coreg remains on hold also on amiodarone resume cautiously per cardio. Monitor for CAD, has ICD in place> will be interrogated. Cardiology has been consulted-await recs.  CAD Chronic combined systolic and diastolic CHF with EF 25 to 30%: Patient with volume depletion needing IV fluids, GDMT to be addressed per cardiology given patient's bradycardia and hypotension. Net IO Since Admission: No IO data has been entered for this period [04/28/22 1111]  Filed Weights   04/27/22 1455  Weight: 106.1 kg  Right adnexal mass:right adnexal cyst-5.6 cm slightly increased from 11/2020 concerning for low-grade  cystic neoplasm> recommend GYN consult/pelvis MRI if clinically warranted>Patient follows with gyn Kendell Bane: Per GYN note from 10/04/2021: "She remains asymptomatic. We discussed that her EF was 30% and so there would be higher surgical risk. We reviewed that if persistent ORADS3 we would plan to RTC 3 months with TVUS prior to continue addressing risks and benefits of surgical removal. If ORADS4 will need surgery given recent growth. "  Patient will be advised to follow-up with her GYN as outpatient  Morbid Obesity:Patient's Body mass index is 44.21 kg/m. : Will benefit with PCP follow-up, weight loss  healthy lifestyle and outpatient sleep evaluation.   DVT prophylaxis:  Code Status:   Code Status: Full Code Family Communication: plan of care discussed with patient at bedside. Patient status is: Inpatient because of UTI, bradycardia Level of care: Telemetry Medical   Dispo: The patient is from: home get ptot eval            Anticipated disposition: TBD Objective: Vitals last 24 hrs: Vitals:   04/28/22 0104 04/28/22 0430 04/28/22 0511 04/28/22 0754  BP:  (!) 122/59  138/66  Pulse:  (!) 47  (!) 50  Resp:  20  18  Temp: 98.2 F (36.8 C)  98.4 F (36.9 C) 98.1 F (36.7 C)  TempSrc: Oral  Oral   SpO2:  96%  97%  Weight:      Height:       Weight change:   Physical Examination: General exam: alert awake, older than stated age HEENT:Oral mucosa moist, Ear/Nose WNL grossly Respiratory system: bilaterally clear BS, no use of accessory muscle Cardiovascular system: S1 & S2 +, No JVD. Gastrointestinal system: Abdomen soft,NT,ND, BS+ Nervous System:Alert, awake, moving extremities. Extremities: LE edema neg,distal peripheral pulses palpable.  Skin: No rashes,no icterus. MSK: Normal muscle bulk,tone, power  Medications reviewed:  Scheduled Meds:  amiodarone  200 mg Oral Daily   apixaban  5 mg Oral BID   gabapentin  300 mg Oral QHS   insulin aspart  0-15 Units Subcutaneous TID  WC   insulin aspart  0-5 Units Subcutaneous QHS   isosorbide mononitrate  30 mg Oral Daily   rosuvastatin  20 mg Oral q1800   sacubitril-valsartan  1 tablet Oral BID   sodium zirconium cyclosilicate  10 g Oral Once   Continuous Infusions:  cefTRIAXone (ROCEPHIN)  IV     Diet Order             Diet heart healthy/carb modified Room service appropriate? Yes; Fluid consistency: Thin  Diet effective now                 No intake or output data in the 24 hours ending 04/28/22 1109 Net IO Since Admission: No IO data has been entered for this period [04/28/22 1109]  Wt Readings from Last 3 Encounters:  04/27/22 106.1 kg  03/06/22 109.3 kg  01/31/22 100.7 kg     Unresulted Labs (From admission, onward)    None     Data Reviewed: I have personally reviewed following labs and imaging studies CBC: Recent Labs  Lab 04/27/22 1511 04/28/22 0102 04/28/22 0538  WBC 4.9 5.6 5.4  NEUTROABS 2.1  --  2.8  HGB 12.6 12.7 12.7  HCT 38.6 39.2 39.0  MCV 93.0 93.1 92.9  PLT 147* 116* 119*   Basic Metabolic Panel: Recent Labs  Lab 04/27/22 1511 04/28/22 0102 04/28/22 1610  NA 140  --  137  K 5.3*  --  3.3*  CL 109  --  103  CO2 24  --  23  GLUCOSE 88  --  156*  BUN 24*  --  21  CREATININE 1.42* 1.31* 1.33*  CALCIUM 8.3*  --  8.8*  MG  --   --  2.3   GFR: Estimated Creatinine Clearance: 42.9 mL/min (A) (by C-G formula based on SCr of 1.33 mg/dL (H)). Liver Function Tests: Recent Labs  Lab 04/27/22 1511 04/28/22 0538  AST 57* 25  ALT 23 21  ALKPHOS 70 76  BILITOT 2.3* 0.6  PROT 6.5 6.9  ALBUMIN 2.9* 3.1*   Recent Labs  Lab 04/27/22 2210 04/27/22 2308 04/28/22 0058 04/28/22 0802  GLUCAP 68* 110* 180* 186*   Recent Labs  Lab 04/27/22 1518  LATICACIDVEN 1.3    Recent Results (from the past 240 hour(s))  Urine Culture     Status: None (Preliminary result)   Collection Time: 04/27/22  3:11 PM   Specimen: Urine, Clean Catch  Result Value Ref Range Status    Specimen Description URINE, CLEAN CATCH  Final   Special Requests   Final    NONE Reflexed from [id number] Performed at [facility], [address] [place], [place] [address]    Culture PENDING  Incomplete   Report Status PENDING  Incomplete    Antimicrobials: Anti-infectives (From admission, onward)    Start     Dose/Rate Route Frequency Ordered Stop   04/28/22 1900  cefTRIAXone (ROCEPHIN) 1 g in sodium chloride 0.9 % 100 mL IVPB        1 g 200 mL/hr over 30 Minutes Intravenous Every 24 hours 04/27/22 2113     04/27/22 1945  cefTRIAXone (ROCEPHIN) 1 g in sodium chloride 0.9 % 100 mL IVPB        1 g 200 mL/hr over 30 Minutes Intravenous  Once 04/27/22 1933 04/27/22 2043      Culture/Microbiology    Component Value Date/Time   SDES URINE, CLEAN CATCH 04/27/2022 1511   SPECREQUEST  04/27/2022 1511    NONE Reflexed from [id number] Performed at [facility], [address] [place], [place] [address]    CULT PENDING 04/27/2022 1511   REPTSTATUS PENDING 04/27/2022 1511  Other culture-see note  Radiology Studies: CT Renal Stone Study  Result Date: 04/27/2022 CLINICAL DATA:  Hypotension and dizziness.  Cloudy urine. EXAM: CT ABDOMEN AND PELVIS WITHOUT CONTRAST TECHNIQUE: Multidetector CT imaging of the abdomen and pelvis was performed following the standard protocol without IV contrast. RADIATION DOSE REDUCTION: This exam was performed according to the departmental dose-optimization program which includes automated exposure control, adjustment of the mA and/or kV according to patient size and/or use of iterative reconstruction technique. COMPARISON:  CT abdomen and pelvis 10/30/2021 FINDINGS: Lower chest: Bibasilar atelectasis/scarring. Cardiomegaly. Partially visualized ICD. Hepatobiliary: Cholelithiasis without evidence of cholecystitis. Unremarkable liver. Pancreas: Unremarkable. Spleen: Unremarkable. Adrenals/Urinary Tract: Stable adrenal glands. Nonobstructing stone in the  lower pole of the right kidney measuring 8 mm. No obstructing urinary calculi or hydronephrosis. Unremarkable bladder. Stomach/Bowel: Normal caliber large and small bowel. Normal appendix containing an appendicolith. Stomach is within normal limits. Small hiatal hernia. Vascular/Lymphatic: Extensive arterial vascular calcifications. Iliac artery stents bilaterally. Left common iliac vein stent. No lymphadenopathy. Reproductive: Uterus is unremarkable. Unchanged 5.5 x 5.6 cm right adnexal cyst. Other: No free intraperitoneal fluid or air. Ventral abdominal wall fat containing hernia Musculoskeletal: Chronic compression deformities of  L2, L3, L4. Thoracolumbar spondylosis. No acute abnormality. IMPRESSION: 1. Nonobstructing right nephrolithiasis no obstructing urinary calculi or hydronephrosis. 2. Cholelithiasis without evidence of cholecystitis. 3. Unchanged right adnexal cyst measuring up to 5.6 cm. This has slowly increased in size from 11/30/2020 concerning for low-grade cystic neoplasm. 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 Recommend GYN consult, and consider pelvis MRI w/o and w/ contrast if clinically warranted. Note: This recommendation does not apply to premenarchal patients and to those with increased risk (genetic, family history, elevated tumor markers or other high-risk factors) of ovarian cancer. Reference: JACR 2020 Feb; 17(2):248-254 4.  Aortic Atherosclerosis (ICD10-I70.0). Electronically Signed   By: Minerva Fester M.D.   On: 04/27/2022 18:16   DG Chest 2 View  Result Date: 04/27/2022 CLINICAL DATA:  Hypertension and dizziness today.  History of CHF. EXAM: CHEST - 2 VIEW COMPARISON:  Chest radiographs 01/12/2022 and 10/29/2021 FINDINGS: Status post median sternotomy and CABG. Cardiac silhouette is again moderately enlarged. Moderate calcification within the aortic arch. Mildly decreased lung volumes. Bibasilar linear densities are slightly decreased from prior, likely subsegmental  atelectasis and/or scarring. No definite pleural effusion. No pneumothorax. Moderate multilevel degenerative disc changes of the thoracic spine. IMPRESSION: 1. Mildly decreased lung volumes and bibasilar subsegmental atelectasis and/or scarring. Overall the lower lung aeration is mildly improved compared to the most recent 01/12/2022 radiograph. 2. Stable cardiomegaly. Electronically Signed   By: Neita Garnet M.D.   On: 04/27/2022 15:48     LOS: 1 day   Lanae Boast, MD [facility]  04/28/2022, 11:09 AM

## 2022-04-28 NOTE — ED Notes (Signed)
ED TO INPATIENT HANDOFF REPORT  ED Nurse Name and Phone #:  Adien Kimmel [phone]  S Name/Age/Gender [name] 72 y.o. female Room/Bed: 044C/044C  Code Status   Code Status: Full Code  Home/SNF/Other Home Patient oriented to: situation Is this baseline? Yes   Triage Complete: Triage complete  Chief Complaint Acute cystitis [N30.00]  Triage Note Pt BIB [facility] from doctor's office due to hypotension of 79/40 with dizziness.  Pt was at doctor's office about cloudy urine for the past week.  Pt does have pacemaker and hx CHF.  NS given en route.  20g right hand.     Allergies Allergies  Allergen Reactions   Metformin And Related Itching, Swelling and Other (See Comments)    Leg pain & swelling in legs   Ozempic (0.25 Or 0.5 Mg-Dose) [Semaglutide(0.25 Or 0.5mg -Dos)] Other (See Comments)    Pt with hx of pancreatitis    Rosuvastatin    Atorvastatin Calcium Itching and Rash   Levofloxacin Itching    Level of Care/Admitting Diagnosis ED Disposition     ED Disposition  Admit   Condition  --   Comment  Hospital Area: [facility] [[id number]]  Level of Care: Telemetry Medical [[id number]]  May admit patient to [facility] or [facility] if equivalent level of care is available:: No  Covid Evaluation: Confirmed COVID Negative  Diagnosis: Acute cystitis [595.0.ICD-9-CM]  Admitting Physician: Gery Pray [[id number]]  Attending Physician: Gery Pray [id number]  Certification:: I certify this patient will need inpatient services for at least 2 midnights  Estimated Length of Stay: 2          B Medical/Surgery History Past Medical History:  Diagnosis Date   Abnormal LFTs    AICD (automatic cardioverter/defibrillator) present    Angina decubitus 11/06/2012   Angina pectoris    Anxiety    Arthritis    Back pain    Bundle branch block 05/2016   CAD (coronary artery disease) 06/18/2010   CHF (congestive heart failure)    Chronic combined systolic and diastolic  CHF, NYHA class 3    Chronic combined systolic and diastolic heart failure    Chronic systolic dysfunction of left ventricle 09/24/2012   Coronary artery disease    s/p CABG 2007   Depression    Dermal mycosis    Diabetes mellitus    type II   DKA (diabetic ketoacidoses) 01/12/2019   DVT (deep venous thrombosis) 09/2015   bilateral   Edema, lower extremity    Glaucoma    Hyperlipidemia    Hypertension    Hypoglycemia 09/09/2012   Insomnia    Insulin dependent diabetes mellitus (HCC) 04/23/2012   Ischemic cardiomyopathy    EF 30-35%, s/p ICD 4/08 by Dr Amil Amen   Joint pain    LBBB (left bundle branch block) 09/24/2012   Morbid obesity 11/06/2012   Obesity    Oxygen dependent 2 L   Pain in left knee    Peripheral neuropathy    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   LIMA to D2, SVG to ramus intermediate, sequential SVG to OM1-OM2, SVG to RCA with EVH via both legs    Sleep apnea    uses O2 at night   Tinea    Type 2 diabetes mellitus with hyperglycemia, with long-term current use of insulin 04/20/2019   Uncontrolled diabetes mellitus 02/2019   Ventricular tachycardia 01/16/15   sustained VT terminated with ATP, CL 340 msec   Vitamin B 12 deficiency 11/05/2018  Vitamin D deficiency 11/05/2018   Past Surgical History:  Procedure Laterality Date   BI-VENTRICULAR IMPLANTABLE CARDIOVERTER DEFIBRILLATOR UPGRADE N/A 09/25/2012   Procedure: BI-VENTRICULAR IMPLANTABLE CARDIOVERTER DEFIBRILLATOR UPGRADE;  Surgeon: Gardiner Rhyme, MD;  Location: [facility] CATH LAB;  Service: Cardiovascular;  Laterality: N/A;   BREAST BIOPSY Right 04/05/2014   CARDIAC CATHETERIZATION     CARDIAC DEFIBRILLATOR PLACEMENT  4/08   by Dr Amil Amen (MDT)   COLONOSCOPY WITH PROPOFOL N/A 10/12/2016   Procedure: COLONOSCOPY WITH PROPOFOL;  Surgeon: Sherrilyn Rist, MD;  Location: [facility] ENDOSCOPY;  Service: Gastroenterology;  Laterality: N/A;   CORONARY ARTERY BYPASS GRAFT  10/05/2005   by Dr Laneta Simmers   IMPLANTABLE CARDIOVERTER DEFIBRILLATOR  IMPLANT  09/25/12   attempt of upgrade to CRT-D unsuccessful due to CS anatomy, SJM Unify Asaura device placed with LV port capped by Dr Johney Frame   IR GENERIC HISTORICAL  10/03/2015   IR US GUIDE VASC ACCESS LEFT 10/03/2015 Malachy Moan, MD [facility]   IR GENERIC HISTORICAL  10/03/2015   IR VENO/EXT/UNI LEFT 10/03/2015 Malachy Moan, MD [facility]   IR GENERIC HISTORICAL  10/03/2015   IR VENOCAVAGRAM IVC 10/03/2015 Malachy Moan, MD [facility]   IR GENERIC HISTORICAL  10/03/2015   IR INFUSION THROMBOL VENOUS INITIAL (MS) 10/03/2015 Malachy Moan, MD [facility]   IR GENERIC HISTORICAL  10/03/2015   IR US GUIDE VASC ACCESS LEFT 10/03/2015 Malachy Moan, MD [facility]   IR GENERIC HISTORICAL  10/04/2015   IR THROMBECT VENO MECH MOD SED 10/04/2015 Simonne Come, MD [facility]   IR GENERIC HISTORICAL  10/04/2015   IR TRANSCATH PLC STENT 1ST ART NOT LE CV CAR VERT CAR 10/04/2015 Simonne Come, MD [facility]   IR GENERIC HISTORICAL  10/04/2015   IR THROMB F/U EVAL ART/VEN FINAL DAY (MS) 10/04/2015 Simonne Come, MD [facility]   IR GENERIC HISTORICAL  11/02/2015   IR RADIOLOGIST EVAL & MGMT 11/02/2015 Simonne Come, MD [facility]   IR RADIOLOGIST EVAL & MGMT  04/26/2016   LEFT HEART CATH AND CORS/GRAFTS ANGIOGRAPHY N/A 01/19/2019   Procedure: LEFT HEART CATH AND CORS/GRAFTS ANGIOGRAPHY;  Surgeon: Lyn Records, MD;  Location: [facility] LAB;  Service: Cardiovascular;  Laterality: N/A;   LEFT HEART CATH AND CORS/GRAFTS ANGIOGRAPHY N/A 01/15/2022   Procedure: LEFT HEART CATH AND CORS/GRAFTS ANGIOGRAPHY;  Surgeon: Runell Gess, MD;  Location: [facility]VAS[facility] CV LAB;  Service: Cardiovascular;  Laterality: N/A;     A IV Location/Drains/Wounds Patient Lines/Drains/Airways Status     Active Line/Drains/Airways     Name Placement date Placement time Site Days   Peripheral IV 04/27/22 20 G Anterior;Proximal;Right Forearm 04/27/22  1507  Forearm  1   Pressure Injury 02/20/19  Sacrum Right Stage 2 -  Partial thickness loss of dermis presenting as a shallow open injury with a red, pink wound bed without slough. 02/20/19  1408  -- 1163   Pressure Injury 02/20/19 Buttocks Right Stage 2 -  Partial thickness loss of dermis presenting as a shallow open injury with a red, pink wound bed without slough. 02/20/19  1409  -- 1163   Pressure Injury 08/16/21 Buttocks Right;Medial Stage 2 -  Partial thickness loss of dermis presenting as a shallow open injury with a red, pink wound bed without slough. partial tissue loss 2cm x 3 cm reddened area 08/16/21  1750  -- 255   Wound / Incision (Open or Dehisced) 01/12/22 Irritant Dermatitis (Moisture Associated Skin Damage);Skin tear Sternum  Lower Multiple small red and open spots 01/12/22  1732  Sternum  106   Wound / Incision (Open or Dehisced) 01/12/22 Non-pressure wound Abdomen Left;Lower 2 small, healing red spots 01/12/22  1734  Abdomen  106            Intake/Output Last 24 hours No intake or output data in the 24 hours ending 04/28/22 1245  Labs/Imaging Results for orders placed or performed during the hospital encounter of 04/27/22 (from the past 48 hour(s))  Comprehensive metabolic panel     Status: Abnormal   Collection Time: 04/27/22  3:11 PM  Result Value Ref Range   Sodium 140 135 - 145 mmol/L   Potassium 5.3 (H) 3.5 - 5.1 mmol/L   Chloride 109 98 - 111 mmol/L   CO2 24 22 - 32 mmol/L   Glucose, Bld 88 70 - 99 mg/dL    Comment: Glucose reference range applies only to samples taken after fasting for at least 8 hours.   BUN 24 (H) 8 - 23 mg/dL   Creatinine, Ser 0.98 (H) 0.44 - 1.00 mg/dL   Calcium 8.3 (L) 8.9 - 10.3 mg/dL   Total Protein 6.5 6.5 - 8.1 g/dL   Albumin 2.9 (L) 3.5 - 5.0 g/dL   AST 57 (H) 15 - 41 U/L   ALT 23 0 - 44 U/L   Alkaline Phosphatase 70 38 - 126 U/L   Total Bilirubin 2.3 (H) 0.3 - 1.2 mg/dL   GFR, Estimated 39 (L) >60 mL/min    Comment: (NOTE) Calculated using the CKD-EPI Creatinine Equation  (2021)    Anion gap 7 5 - 15    Comment: Performed at [facility], [address], [place], [place] [address]  CBC with Differential     Status: Abnormal   Collection Time: 04/27/22  3:11 PM  Result Value Ref Range   WBC 4.9 4.0 - 10.5 K/uL   RBC 4.15 3.87 - 5.11 MIL/uL   Hemoglobin 12.6 12.0 - 15.0 g/dL   HCT 78.2 95.6 - 21.3 %   MCV 93.0 80.0 - 100.0 fL   MCH 30.4 26.0 - 34.0 pg   MCHC 32.6 30.0 - 36.0 g/dL   RDW 08.6 57.8 - 46.9 %   Platelets 147 (L) 150 - 400 K/uL   nRBC 0.0 0.0 - 0.2 %   Neutrophils Relative % 43 %   Neutro Abs 2.1 1.7 - 7.7 K/uL   Lymphocytes Relative 42 %   Lymphs Abs 2.1 0.7 - 4.0 K/uL   Monocytes Relative 13 %   Monocytes Absolute 0.6 0.1 - 1.0 K/uL   Eosinophils Relative 1 %   Eosinophils Absolute 0.1 0.0 - 0.5 K/uL   Basophils Relative 1 %   Basophils Absolute 0.0 0.0 - 0.1 K/uL   Immature Granulocytes 0 %   Abs Immature Granulocytes 0.01 0.00 - 0.07 K/uL    Comment: Performed at [facility], [address], [place], [place] [address]  Troponin I (High Sensitivity)     Status: None   Collection Time: 04/27/22  3:11 PM  Result Value Ref Range   Troponin I (High Sensitivity) 14 <18 ng/L    Comment: (NOTE) Elevated high sensitivity troponin I (hsTnI) values and significant  changes across serial measurements may suggest ACS but many other  chronic and acute conditions are known to elevate hsTnI results.  Refer to the "Links" section for chest pain algorithms and additional  guidance. Performed at [facility],  [address], [place], [place] [address]   Brain natriuretic peptide     Status: None   Collection Time: 04/27/22  3:11 PM  Result Value Ref Range   B Natriuretic Peptide 81.7 0.0 - 100.0 pg/mL    Comment: Performed at [facility], [address] [place], [place] [address]  Urinalysis, w/ Reflex to Culture (Infection Suspected) -Urine, Clean Catch     Status: Abnormal   Collection Time: 04/27/22  3:11 PM   Result Value Ref Range   Specimen Source URINE, CLEAN CATCH    Color, Urine YELLOW YELLOW   APPearance CLOUDY (A) CLEAR   Specific Gravity, Urine 1.007 1.005 - 1.030   pH 5.0 5.0 - 8.0   Glucose, UA >=500 (A) NEGATIVE mg/dL   Hgb urine dipstick MODERATE (A) NEGATIVE   Bilirubin Urine NEGATIVE NEGATIVE   Ketones, ur NEGATIVE NEGATIVE mg/dL   Protein, ur NEGATIVE NEGATIVE mg/dL   Nitrite POSITIVE (A) NEGATIVE   Leukocytes,Ua LARGE (A) NEGATIVE   RBC / HPF 6-10 0 - 5 RBC/hpf   WBC, UA 21-50 0 - 5 WBC/hpf    Comment:        Reflex urine culture not performed if WBC <=10, OR if Squamous epithelial cells >5. If Squamous epithelial cells >5 suggest recollection.    Bacteria, UA MANY (A) NONE SEEN   Squamous Epithelial / HPF 0-5 0 - 5 /HPF   Mucus PRESENT    Amorphous Crystal PRESENT     Comment: Performed at [facility], [address], [place], [place] [address]  Urine Culture     Status: None (Preliminary result)   Collection Time: 04/27/22  3:11 PM   Specimen: Urine, Clean Catch  Result Value Ref Range   Specimen Description URINE, CLEAN CATCH    Special Requests      NONE Reflexed from [id number] Performed at [facility], [address], [place], [place] [address]    Culture PENDING    Report Status PENDING   Lactic acid, plasma     Status: None   Collection Time: 04/27/22  3:18 PM  Result Value Ref Range   Lactic Acid, Venous 1.3 0.5 - 1.9 mmol/L    Comment: Performed at [facility], [address], [place], [place] [address]  CBG monitoring, ED     Status: Abnormal   Collection Time: 04/27/22 10:10 PM  Result Value Ref Range   Glucose-Capillary 68 (L) 70 - 99 mg/dL    Comment: Glucose reference range applies only to samples taken after fasting for at least 8 hours.  CBG monitoring, ED     Status: Abnormal   Collection Time: 04/27/22 11:08 PM  Result Value Ref Range   Glucose-Capillary 110 (H) 70 - 99 mg/dL    Comment: Glucose reference range  applies only to samples taken after fasting for at least 8 hours.  CBG monitoring, ED     Status: Abnormal   Collection Time: 04/28/22 12:58 AM  Result Value Ref Range   Glucose-Capillary 180 (H) 70 - 99 mg/dL    Comment: Glucose reference range applies only to samples taken after fasting for at least 8 hours.  CBC     Status: Abnormal   Collection Time: 04/28/22  1:02 AM  Result Value Ref Range   WBC 5.6 4.0 - 10.5 K/uL   RBC 4.21 3.87 - 5.11 MIL/uL   Hemoglobin 12.7 12.0 - 15.0 g/dL   HCT 78.4 69.6 -  46.0 %   MCV 93.1 80.0 - 100.0 fL   MCH 30.2 26.0 - 34.0 pg   MCHC 32.4 30.0 - 36.0 g/dL   RDW 16.1 09.6 - 04.5 %   Platelets 116 (L) 150 - 400 K/uL    Comment: REPEATED TO VERIFY   nRBC 0.0 0.0 - 0.2 %    Comment: Performed at [facility], [address], [place], [place] [address]  Creatinine, serum     Status: Abnormal   Collection Time: 04/28/22  1:02 AM  Result Value Ref Range   Creatinine, Ser 1.31 (H) 0.44 - 1.00 mg/dL   GFR, Estimated 43 (L) >60 mL/min    Comment: (NOTE) Calculated using the CKD-EPI Creatinine Equation (2021) Performed at [facility], [address], [place], [place] [address]   Magnesium     Status: None   Collection Time: 04/28/22  5:38 AM  Result Value Ref Range   Magnesium 2.3 1.7 - 2.4 mg/dL    Comment: Performed at [facility], [address] [place], [place] [address]  CBC with Differential/Platelet     Status: Abnormal   Collection Time: 04/28/22  5:38 AM  Result Value Ref Range   WBC 5.4 4.0 - 10.5 K/uL   RBC 4.20 3.87 - 5.11 MIL/uL   Hemoglobin 12.7 12.0 - 15.0 g/dL   HCT 21.3 08.6 - 57.8 %   MCV 92.9 80.0 - 100.0 fL   MCH 30.2 26.0 - 34.0 pg   MCHC 32.6 30.0 - 36.0 g/dL   RDW 46.9 62.9 - 52.8 %   Platelets 119 (L) 150 - 400 K/uL    Comment: REPEATED TO VERIFY   nRBC 0.0 0.0 - 0.2 %   Neutrophils Relative % 51 %   Neutro Abs 2.8 1.7 - 7.7 K/uL   Lymphocytes Relative 34 %   Lymphs Abs 1.9 0.7 - 4.0 K/uL    Monocytes Relative 12 %   Monocytes Absolute 0.7 0.1 - 1.0 K/uL   Eosinophils Relative 2 %   Eosinophils Absolute 0.1 0.0 - 0.5 K/uL   Basophils Relative 1 %   Basophils Absolute 0.0 0.0 - 0.1 K/uL   Immature Granulocytes 0 %   Abs Immature Granulocytes 0.01 0.00 - 0.07 K/uL    Comment: Performed at [facility], [address] [place], [place] [address]  Comprehensive metabolic panel     Status: Abnormal   Collection Time: 04/28/22  5:38 AM  Result Value Ref Range   Sodium 137 135 - 145 mmol/L   Potassium 3.3 (L) 3.5 - 5.1 mmol/L   Chloride 103 98 - 111 mmol/L   CO2 23 22 - 32 mmol/L   Glucose, Bld 156 (H) 70 - 99 mg/dL    Comment: Glucose reference range applies only to samples taken after fasting for at least 8 hours.   BUN 21 8 - 23 mg/dL   Creatinine, Ser 4.01 (H) 0.44 - 1.00 mg/dL   Calcium 8.8 (L) 8.9 - 10.3 mg/dL   Total Protein 6.9 6.5 - 8.1 g/dL   Albumin 3.1 (L) 3.5 - 5.0 g/dL   AST 25 15 - 41 U/L   ALT 21 0 - 44 U/L   Alkaline Phosphatase 76 38 - 126 U/L   Total Bilirubin 0.6 0.3 - 1.2 mg/dL   GFR, Estimated 43 (L) >60 mL/min    Comment: (NOTE) Calculated using the CKD-EPI Creatinine Equation (2021)    Anion gap 11 5 -  15    Comment: Performed at [facility], [address] [place], [place] [address]  CBG monitoring, ED     Status: Abnormal   Collection Time: 04/28/22  8:02 AM  Result Value Ref Range   Glucose-Capillary 186 (H) 70 - 99 mg/dL    Comment: Glucose reference range applies only to samples taken after fasting for at least 8 hours.  CBG monitoring, ED     Status: Abnormal   Collection Time: 04/28/22 12:26 PM  Result Value Ref Range   Glucose-Capillary 160 (H) 70 - 99 mg/dL    Comment: Glucose reference range applies only to samples taken after fasting for at least 8 hours.   CT Renal Stone Study  Result Date: 04/27/2022 CLINICAL DATA:  Hypotension and dizziness.  Cloudy urine. EXAM: CT ABDOMEN AND PELVIS WITHOUT CONTRAST TECHNIQUE:  Multidetector CT imaging of the abdomen and pelvis was performed following the standard protocol without IV contrast. RADIATION DOSE REDUCTION: This exam was performed according to the departmental dose-optimization program which includes automated exposure control, adjustment of the mA and/or kV according to patient size and/or use of iterative reconstruction technique. COMPARISON:  CT abdomen and pelvis 10/30/2021 FINDINGS: Lower chest: Bibasilar atelectasis/scarring. Cardiomegaly. Partially visualized ICD. Hepatobiliary: Cholelithiasis without evidence of cholecystitis. Unremarkable liver. Pancreas: Unremarkable. Spleen: Unremarkable. Adrenals/Urinary Tract: Stable adrenal glands. Nonobstructing stone in the lower pole of the right kidney measuring 8 mm. No obstructing urinary calculi or hydronephrosis. Unremarkable bladder. Stomach/Bowel: Normal caliber large and small bowel. Normal appendix containing an appendicolith. Stomach is within normal limits. Small hiatal hernia. Vascular/Lymphatic: Extensive arterial vascular calcifications. Iliac artery stents bilaterally. Left common iliac vein stent. No lymphadenopathy. Reproductive: Uterus is unremarkable. Unchanged 5.5 x 5.6 cm right adnexal cyst. Other: No free intraperitoneal fluid or air. Ventral abdominal wall fat containing hernia Musculoskeletal: Chronic compression deformities of L2, L3, L4. Thoracolumbar spondylosis. No acute abnormality. IMPRESSION: 1. Nonobstructing right nephrolithiasis no obstructing urinary calculi or hydronephrosis. 2. Cholelithiasis without evidence of cholecystitis. 3. Unchanged right adnexal cyst measuring up to 5.6 cm. This has slowly increased in size from 11/30/2020 concerning for low-grade cystic neoplasm. 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 Recommend GYN consult, and consider pelvis MRI w/o and w/ contrast if clinically warranted. Note: This recommendation does not apply to premenarchal patients and to those  with increased risk (genetic, family history, elevated tumor markers or other high-risk factors) of ovarian cancer. Reference: JACR 2020 Feb; 17(2):248-254 4.  Aortic Atherosclerosis (ICD10-I70.0). Electronically Signed   By: Minerva Fester M.D.   On: 04/27/2022 18:16   DG Chest 2 View  Result Date: 04/27/2022 CLINICAL DATA:  Hypertension and dizziness today.  History of CHF. EXAM: CHEST - 2 VIEW COMPARISON:  Chest radiographs 01/12/2022 and 10/29/2021 FINDINGS: Status post median sternotomy and CABG. Cardiac silhouette is again moderately enlarged. Moderate calcification within the aortic arch. Mildly decreased lung volumes. Bibasilar linear densities are slightly decreased from prior, likely subsegmental atelectasis and/or scarring. No definite pleural effusion. No pneumothorax. Moderate multilevel degenerative disc changes of the thoracic spine. IMPRESSION: 1. Mildly decreased lung volumes and bibasilar subsegmental atelectasis and/or scarring. Overall the lower lung aeration is mildly improved compared to the most recent 01/12/2022 radiograph. 2. Stable cardiomegaly. Electronically Signed   By: Neita Garnet M.D.   On: 04/27/2022 15:48    Pending Labs Unresulted Labs (From admission, onward)     Start     Ordered   04/29/22 0500  CBC  Daily,   R  04/28/22 1113   04/29/22 0500  Basic metabolic panel  Daily,   R      04/28/22 1113            Vitals/Pain Today's Vitals   04/28/22 0511 04/28/22 0530 04/28/22 0754 04/28/22 1151  BP:   138/66 (!) 135/58  Pulse:   (!) 50 (!) 50  Resp:   18 18  Temp: 98.4 F (36.9 C)  98.1 F (36.7 C) 100 F (37.8 C)  TempSrc: Oral     SpO2:   97% 97%  Weight:      Height:      PainSc:  0-No pain      Isolation Precautions No active isolations  Medications Medications  sodium zirconium cyclosilicate (LOKELMA) packet 10 g (10 g Oral Patient Refused/Not Given 04/28/22 0054)  apixaban (ELIQUIS) tablet 5 mg (5 mg Oral Given 04/28/22 0853)   gabapentin (NEURONTIN) capsule 300 mg (300 mg Oral Given 04/27/22 2336)  isosorbide mononitrate (IMDUR) 24 hr tablet 30 mg (has no administration in time range)  rosuvastatin (CRESTOR) tablet 20 mg (has no administration in time range)  sacubitril-valsartan (ENTRESTO) 24-26 mg per tablet (1 tablet Oral Given 04/28/22 0852)  insulin aspart (novoLOG) injection 0-15 Units (has no administration in time range)  insulin aspart (novoLOG) injection 0-5 Units (0 Units Subcutaneous Hold 04/27/22 2339)  albuterol (PROVENTIL) (2.5 MG/3ML) 0.083% nebulizer solution 2.5 mg (has no administration in time range)  cefTRIAXone (ROCEPHIN) 1 g in sodium chloride 0.9 % 100 mL IVPB (has no administration in time range)  amiodarone (PACERONE) tablet 200 mg (200 mg Oral Given 04/28/22 0853)  acetaminophen (TYLENOL) tablet 650 mg (has no administration in time range)    Or  acetaminophen (TYLENOL) suppository 650 mg (has no administration in time range)  senna-docusate (Senokot-S) tablet 1 tablet (has no administration in time range)  sodium chloride 0.9 % bolus 500 mL (0 mLs Intravenous Stopped 04/27/22 1820)  cefTRIAXone (ROCEPHIN) 1 g in sodium chloride 0.9 % 100 mL IVPB (0 g Intravenous Stopped 04/27/22 2043)  potassium chloride SA (KLOR-CON M) CR tablet 20 mEq (20 mEq Oral Given 04/28/22 4540)    Mobility non-ambulatory     Focused Assessments     R Recommendations: See Admitting Provider Note  Report given to:   Additional Notes:  purewick in place. Oriented x 4. Hasn't had any complaints this shift.

## 2022-04-28 NOTE — Consult Note (Addendum)
Cardiology Consultation   Patient ID: [name] MRN: [id number]; DOB: August 13, 1950  Admit date: 04/27/2022 Date of Consult: 04/28/2022  PCP:  Marva Panda, NP   [facility] Providers Cardiologist:  Donato Schultz, MD  Electrophysiologist:  Lanier Prude, MD       Patient Profile:   [name] is a 72 y.o. female with a hx of CABG x 5 in 2007 with ischemic cardiomyopathy, LBBB w/ prior VT, STJ ICD originally implanted in April 2008 with subsequent upgrades. Hx DM (w/hx of DKA), DVT, HTN, HLD, obesity, OSA (w/O2 at [facility]). Her LV port is plugged and nonfunctioning.  Not interested in pursuing cardiothoracic evaluation for epicardial LV lead.   Echo in 2021 showed EF of approximately 50%.  However, there was degradation of EF to 25% once again after V-fib appropriate ICD shock in Jan 2024 in the setting of nausea vomiting diarrhea.  Goal-directed medical therapy was improved.   Cardiac catheterization following this most recent event in 2024 showed stable anatomy with patent grafts.  EF decline was not ischemically mediated.   Last device interrogation 04/15, functioning well, VP w/ w/out AP 3.6% of the time.   She was admitted 04/19 for presyncope related to hypotension, ?UTI (UA ok).   Her ECG showed prolonged QT and cardiology was asked to see 04/28/2022  at the request of Dr Joneen Roach.  History of Present Illness:   [name] says that her weight has been stable.  She has been 234 pounds on her home scales without significant variation.  She has not had any chest pain.  Her respiratory status has been stable.  She does not wake with lower extremity edema, but has daytime lower extremity edema and she has some sort of pneumatic compression device that she wears on her legs every other day.  This helps keep the swelling down.  She never has palpitations and has no awareness that her heart ever skips her bases.  She has occasional spells about twice a week  where she will feel suddenly dizzy and lightheaded.  The symptoms make her feel weak and she will lie down and go to sleep and the symptoms will resolve.  Feeling lightheaded at the doctor's office was unexpected, she had not been feeling lightheaded before then, but was unaware that her blood pressure was low.  She does her own medications and has them set out in the case so that she will know what to take.  However, she has been taking amiodarone twice a day since January.  She lives with her son who manages her diet and fluid intake.  He does not help with her meds.   Past Medical History:  Diagnosis Date   Abnormal LFTs    AICD (automatic cardioverter/defibrillator) present    Angina decubitus 11/06/2012   Angina pectoris    Anxiety    Arthritis    Back pain    Bundle branch block 05/2016   CAD (coronary artery disease) 06/18/2010   CHF (congestive heart failure)    Chronic combined systolic and diastolic CHF, NYHA class 3    Chronic combined systolic and diastolic heart failure    Chronic systolic dysfunction of left ventricle 09/24/2012   Coronary artery disease    s/p CABG 2007   Depression    Dermal mycosis    Diabetes mellitus    type II   DKA (diabetic ketoacidoses) 01/12/2019   DVT (deep venous thrombosis) 09/2015   bilateral   Edema,  lower extremity    Glaucoma    Hyperlipidemia    Hypertension    Hypoglycemia 09/09/2012   Insomnia    Insulin dependent diabetes mellitus (HCC) 04/23/2012   Ischemic cardiomyopathy    EF 30-35%, s/p ICD 4/08 by Dr Amil Amen   Joint pain    LBBB (left bundle branch block) 09/24/2012   Morbid obesity 11/06/2012   Obesity    Oxygen dependent 2 L   Pain in left knee    Peripheral neuropathy    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   LIMA to D2, SVG to ramus intermediate, sequential SVG to OM1-OM2, SVG to RCA with EVH via both legs    Sleep apnea    uses O2 at night   Tinea    Type 2 diabetes mellitus with hyperglycemia, with long-term current use  of insulin 04/20/2019   Uncontrolled diabetes mellitus 02/2019   Ventricular tachycardia 01/16/15   sustained VT terminated with ATP, CL 340 msec   Vitamin B 12 deficiency 11/05/2018   Vitamin D deficiency 11/05/2018    Past Surgical History:  Procedure Laterality Date   BI-VENTRICULAR IMPLANTABLE CARDIOVERTER DEFIBRILLATOR UPGRADE N/A 09/25/2012   Procedure: BI-VENTRICULAR IMPLANTABLE CARDIOVERTER DEFIBRILLATOR UPGRADE;  Surgeon: Gardiner Rhyme, MD;  Location: [facility] CATH LAB;  Service: Cardiovascular;  Laterality: N/A;   BREAST BIOPSY Right 04/05/2014   CARDIAC CATHETERIZATION     CARDIAC DEFIBRILLATOR PLACEMENT  4/08   by Dr Amil Amen (MDT)   COLONOSCOPY WITH PROPOFOL N/A 10/12/2016   Procedure: COLONOSCOPY WITH PROPOFOL;  Surgeon: Sherrilyn Rist, MD;  Location: [facility] ENDOSCOPY;  Service: Gastroenterology;  Laterality: N/A;   CORONARY ARTERY BYPASS GRAFT  10/05/2005   by Dr Laneta Simmers   IMPLANTABLE CARDIOVERTER DEFIBRILLATOR IMPLANT  09/25/12   attempt of upgrade to CRT-D unsuccessful due to CS anatomy, SJM Unify Asaura device placed with LV port capped by Dr Johney Frame   IR GENERIC HISTORICAL  10/03/2015   IR US GUIDE VASC ACCESS LEFT 10/03/2015 Malachy Moan, MD [facility]   IR GENERIC HISTORICAL  10/03/2015   IR VENO/EXT/UNI LEFT 10/03/2015 Malachy Moan, MD [facility]   IR GENERIC HISTORICAL  10/03/2015   IR VENOCAVAGRAM IVC 10/03/2015 Malachy Moan, MD [facility]   IR GENERIC HISTORICAL  10/03/2015   IR INFUSION THROMBOL VENOUS INITIAL (MS) 10/03/2015 Malachy Moan, MD [facility]   IR GENERIC HISTORICAL  10/03/2015   IR US GUIDE VASC ACCESS LEFT 10/03/2015 Malachy Moan, MD [facility]   IR GENERIC HISTORICAL  10/04/2015   IR THROMBECT VENO MECH MOD SED 10/04/2015 Simonne Come, MD [facility]   IR GENERIC HISTORICAL  10/04/2015   IR TRANSCATH PLC STENT 1ST ART NOT LE CV CAR VERT CAR 10/04/2015 Simonne Come, MD [facility]   IR GENERIC HISTORICAL  10/04/2015   IR THROMB F/U  EVAL ART/VEN FINAL DAY (MS) 10/04/2015 Simonne Come, MD [facility]   IR GENERIC HISTORICAL  11/02/2015   IR RADIOLOGIST EVAL & MGMT 11/02/2015 Simonne Come, MD [facility]   IR RADIOLOGIST EVAL & MGMT  04/26/2016   LEFT HEART CATH AND CORS/GRAFTS ANGIOGRAPHY N/A 01/19/2019   Procedure: LEFT HEART CATH AND CORS/GRAFTS ANGIOGRAPHY;  Surgeon: Lyn Records, MD;  Location: [facility] CV LAB;  Service: Cardiovascular;  Laterality: N/A;   LEFT HEART CATH AND CORS/GRAFTS ANGIOGRAPHY N/A 01/15/2022   Procedure: LEFT HEART CATH AND CORS/GRAFTS ANGIOGRAPHY;  Surgeon: Runell Gess, MD;  Location: [facility]VAS[facility] CV LAB;  Service: Cardiovascular;  Laterality: N/A;  Home Medications:  Prior to Admission medications   Medication Sig Start Date End Date Taking? Authorizing Provider  albuterol (PROVENTIL) (2.5 MG/3ML) 0.083% nebulizer solution Take 2.5 mg by nebulization every 6 (six) hours as needed for wheezing or shortness of breath.  09/30/15   [provider]  Alcohol Swabs (ALCOHOL WIPES) 70 % PADS DropSafe Alcohol Prep Pads  USE WHEN CHECKING BLOOD SUGARS    [provider]  amiodarone (PACERONE) 200 MG tablet Take 2 tablets (400mg ) by mouth daily for one week, then reduce to 1 tablet (200mg ) daily. 02/07/22   Lanier Prude, MD  carvedilol (COREG) 12.5 MG tablet Take 1 tablet (12.5 mg total) by mouth 2 (two) times daily with a meal. 02/07/22   Lanier Prude, MD  Continuous Blood Gluc Receiver (DEXCOM G7 RECEIVER) DEVI 1 Device by Does not apply route as directed. 11/03/21   Shamleffer, Konrad Dolores, MD  Continuous Blood Gluc Sensor (DEXCOM G7 SENSOR) MISC 1 Device by Does not apply route as directed. 11/03/21   Shamleffer, Konrad Dolores, MD  cyanocobalamin (,VITAMIN B-12,) 1000 MCG/ML injection 1,000 mcg every 30 (thirty) days.    [provider]  dapagliflozin propanediol (FARXIGA) 10 MG TABS tablet Take 1 tablet (10 mg total) by mouth daily. 11/03/21    Shamleffer, Konrad Dolores, MD  ELIQUIS 5 MG TABS tablet TAKE 1 TABLET TWICE DAILY 01/10/22   [name], MD  ferrous sulfate 325 (65 FE) MG EC tablet Take 325 mg by mouth every other day.    [provider]  furosemide (LASIX) 40 MG tablet Take 1 tablet (40 mg total) by mouth daily. 01/26/22 04/26/22  Robbie Lis M, PA-C  gabapentin (NEURONTIN) 300 MG capsule Take 300 mg by mouth at bedtime. 12/23/20   [provider]  glucose blood (ACCU-CHEK GUIDE) test strip Check 3 times daily 02/22/22   Shamleffer, Konrad Dolores, MD  insulin degludec (TRESIBA FLEXTOUCH) 100 UNIT/ML FlexTouch Pen Inject 30 Units into the skin daily. 11/03/21   Shamleffer, Konrad Dolores, MD  Insulin Pen Needle 31G X 5 MM MISC 1 Device by Does not apply route in the morning, at noon, in the evening, and at bedtime. 11/03/21   Shamleffer, Konrad Dolores, MD  isosorbide mononitrate (IMDUR) 30 MG 24 hr tablet Take 30 mg by mouth daily. 02/14/21   [provider]  latanoprost (XALATAN) 0.005 % ophthalmic solution Place 1 drop into both eyes at bedtime.    [provider]  nitroGLYCERIN (NITROSTAT) 0.4 MG SL tablet Place 1 tablet (0.4 mg total) under the tongue every 5 (five) minutes as needed for chest pain. 04/18/22   Jake Bathe, MD  NOVOLOG FLEXPEN 100 UNIT/ML FlexPen INJECT PER SLIDING SCALE AS DIRECTED , MAX 30 UNITS PER DAY. DISCARD PEN 28 DAYS AFTER OPENING Patient taking differently: Inject 10-15 Units into the skin 3 (three) times daily with meals. Per sliding scale 10/11/21   Shamleffer, Konrad Dolores, MD  potassium chloride SA (KLOR-CON) 20 MEQ tablet Take 20 mEq by mouth daily as needed (for low pottassium). 02/23/20   [provider]  rosuvastatin (CRESTOR) 20 MG tablet Take 1 tablet (20 mg total) by mouth daily at 6 PM. 10/07/15   Alm Bustard, MD  sacubitril-valsartan (ENTRESTO) 24-26 MG Take 1 tablet by mouth 2 (two) times daily. 02/07/22    Lanier Prude, MD  spironolactone (ALDACTONE) 25 MG tablet Take 0.5 tablets (12.5 mg total) by mouth daily. 01/31/22   Lanier Prude, MD  TRUE METRIX BLOOD GLUCOSE TEST test strip TEST THREE TIMES DAILY 12/11/21   Shamleffer, Konrad Dolores, MD  Vitamin D, Ergocalciferol, (DRISDOL) 1.25 MG (50000 UT) CAPS capsule Take 1 capsule (50,000 Units total) by mouth every 7 (seven) days. 12/30/18   Roswell Nickel, DO    Inpatient Medications: Scheduled Meds:  amiodarone  200 mg Oral Daily   apixaban  5 mg Oral BID   gabapentin  300 mg Oral QHS   insulin aspart  0-15 Units Subcutaneous TID WC   insulin aspart  0-5 Units Subcutaneous QHS   isosorbide mononitrate  30 mg Oral Daily   potassium chloride  20 mEq Oral Once   rosuvastatin  20 mg Oral q1800   sacubitril-valsartan  1 tablet Oral BID   sodium zirconium cyclosilicate  10 g Oral Once   Continuous Infusions:  cefTRIAXone (ROCEPHIN)  IV     PRN Meds: acetaminophen **OR** acetaminophen, albuterol, senna-docusate  Allergies:    Allergies  Allergen Reactions   Metformin And Related Itching, Swelling and Other (See Comments)    Leg pain & swelling in legs   Ozempic (0.25 Or 0.5 Mg-Dose) [Semaglutide(0.25 Or 0.5mg -Dos)] Other (See Comments)    Pt with hx of pancreatitis    Rosuvastatin    Atorvastatin Calcium Itching and Rash   Levofloxacin Itching    Social History:   Social History   Socioeconomic History   Marital status: Legally Separated    Spouse name: Not on file   Number of children: 3   Years of education: 17   Highest education level: Not on file  Occupational History   Occupation: retired    Associate Professor: UNEMPLOYED  Tobacco Use   Smoking status: Never   Smokeless tobacco: Never  Vaping Use   Vaping Use: Never used  Substance and Sexual Activity   Alcohol use: No   Drug use: No   Sexual activity: Not Currently  Other Topics Concern   Not on file  Social History Narrative   Disabled Producer, television/film/video.   Currently taking sociology classes at [employer].   11/04/15 Lives with son    caffeine - coffee, 1-2 cups daily   Social Determinants of Health   Financial Resource Strain: Low Risk  (01/15/2022)   Overall Financial Resource Strain (CARDIA)    Difficulty of Paying Living Expenses: Not very hard  Food Insecurity: No Food Insecurity (01/12/2022)   Hunger Vital Sign    Worried About Running Out of Food in the Last Year: Never true    Ran Out of Food in the Last Year: Never true  Transportation Needs: No Transportation Needs (01/12/2022)   PRAPARE - Administrator, Civil Service (Medical): No    Lack of Transportation (Non-Medical): No  Physical Activity: Not on file  Stress: Not on file  Social Connections: Not on file  Intimate Partner Violence: Not At Risk (01/12/2022)   Humiliation, Afraid, Rape, and Kick questionnaire    Fear of Current or Ex-Partner: No    Emotionally Abused: No    Physically Abused: No    Sexually Abused: No    Family History:   Family History  Problem Relation Age of Onset   Diabetes Mother 66       died - HTN   Stroke Mother    High blood pressure Mother    Sudden death Mother    Obesity Mother    Other Other        No early family hx of CAD  ROS:  Please see the history of present illness.  All other ROS reviewed and negative.     Physical Exam/Data:   Vitals:   04/28/22 0102 04/28/22 0104 04/28/22 0430 04/28/22 0511  BP: 126/63  (!) 122/59   Pulse: (!) 50  (!) 47   Resp: 15  20   Temp:  98.2 F (36.8 C)  98.4 F (36.9 C)  TempSrc:  Oral  Oral  SpO2: 100%  96%   Weight:      Height:       No intake or output data in the 24 hours ending 04/28/22 0745    04/27/2022    2:55 PM 03/06/2022    3:43 PM 01/31/2022    2:19 PM  Last 3 Weights  Weight (lbs) 234 lb 241 lb 222 lb  Weight (kg) 106.142 kg 109.317 kg 100.699 kg     Body mass index is 44.21 kg/m.  General:  Well nourished, obese female, in no acute distress HEENT:  normal Neck: no JVD seen, difficult to assess 2nd body habitus Vascular: No carotid bruits; Distal pulses 2+ bilaterally Cardiac:  normal S1, S2; RRR; no murmur  Lungs:  clear to auscultation bilaterally, no wheezing, rhonchi or rales  Abd: soft, nontender, no hepatomegaly  Ext: no edema Musculoskeletal:  No deformities, BUE and BLE strength weak but equal Skin: warm and dry  Neuro:  CNs 2-12 intact, no focal abnormalities noted Psych:  Normal affect   EKG:  The EKG was personally reviewed and demonstrates:  AV Pacing, HR 50, QT/QTc 658/601 ms (prev ECG was A paced, V sensed w/ QT/QTc 522/475) Telemetry:  Telemetry was personally reviewed and demonstrates:  SR, A pacing w/ mostly V sensing, some slow VT up to 5 bts   Relevant CV Studies:  ECHO: 01/13/2022  1. Left ventricular ejection fraction, by estimation, is 25 to 30%. The  left ventricle has severely decreased function. The left ventricle  demonstrates global hypokinesis. There is moderate left ventricular  hypertrophy. Left ventricular diastolic parameters are consistent with Grade I diastolic dysfunction (impaired relaxation).   2. Right ventricular systolic function is mildly reduced. The right  ventricular size is mildly enlarged. There is normal pulmonary artery  systolic pressure. The estimated right ventricular systolic pressure is  16.4 mmHg.   3. The mitral valve is grossly normal. No evidence of mitral valve  regurgitation. No evidence of mitral stenosis.   4. The aortic valve was not well visualized. Aortic valve regurgitation  is not visualized. Aortic valve sclerosis/calcification is present,  without any evidence of aortic stenosis.   5. The inferior vena cava is normal in size with greater than 50%  respiratory variability, suggesting right atrial pressure of 3 mmHg.   Comparison(s): Changes from prior study are noted. LVEF worsened from 50%  in 2021 to 25% now.   CARDIAC CATH: 01/15/2022   Mid LAD to Dist  LAD lesion is 80% stenosed.   Prox Cx lesion is 100% stenosed.   Prox RCA to Mid RCA lesion is 100% stenosed.   Ramus lesion is 100% stenosed.   1st Diag lesion is 100% stenosed.   Prox Graft lesion is 50% stenosed. IMPRESSION: Ms. Paulita Fujita has patent grafts and severe native vessel disease.  The only change in her anatomy is a 50% lesion in the mid shaft of the RCA vein graft that did not appear to be obstructive.  Her LVEDP was measured at 21.  The etiology of her LV EF decline is  not ischemically mediated.  Guideline directed optimal medical therapy will be recommended.  A Mynx closure device was successfully deployed achieving hemostasis.  Patient left lab stable condition.  Dr. Steffanie Dunn, her electrophysiologist, was made aware of these results.  Diagnostic Dominance: Right     Laboratory Data:  High Sensitivity Troponin:   Recent Labs  Lab 04/27/22 1511  TROPONINIHS 14     Chemistry Recent Labs  Lab 04/27/22 1511 04/28/22 0102 04/28/22 0538  NA 140  --  137  K 5.3*  --  3.3*  CL 109  --  103  CO2 24  --  23  GLUCOSE 88  --  156*  BUN 24*  --  21  CREATININE 1.42* 1.31* 1.33*  CALCIUM 8.3*  --  8.8*  MG  --   --  2.3  GFRNONAA 39* 43* 43*  ANIONGAP 7  --  11    Recent Labs  Lab 04/27/22 1511 04/28/22 0538  PROT 6.5 6.9  ALBUMIN 2.9* 3.1*  AST 57* 25  ALT 23 21  ALKPHOS 70 76  BILITOT 2.3* 0.6   Lipids No results for input(s): "CHOL", "TRIG", "HDL", "LABVLDL", "LDLCALC", "CHOLHDL" in the last 168 hours.  Hematology Recent Labs  Lab 04/27/22 1511 04/28/22 0102 04/28/22 0538  WBC 4.9 5.6 5.4  RBC 4.15 4.21 4.20  HGB 12.6 12.7 12.7  HCT 38.6 39.2 39.0  MCV 93.0 93.1 92.9  MCH 30.4 30.2 30.2  MCHC 32.6 32.4 32.6  RDW 15.1 15.0 15.0  PLT 147* 116* 119*   Thyroid No results for input(s): "TSH", "FREET4" in the last 168 hours.  BNP Recent Labs  Lab 04/27/22 1511  BNP 81.7    DDimer No results for input(s): "DDIMER" in the last 168 hours. Lab  Results  Component Value Date   HGBA1C 7.7 (H) 10/29/2021   Lab Results  Component Value Date   CHOL 125 04/28/2021   HDL 44.20 04/28/2021   LDLCALC 65 04/28/2021   TRIG 78.0 04/28/2021   CHOLHDL 3 04/28/2021     Radiology/Studies:  CT Renal Stone Study  Result Date: 04/27/2022 CLINICAL DATA:  Hypotension and dizziness.  Cloudy urine. EXAM: CT ABDOMEN AND PELVIS WITHOUT CONTRAST TECHNIQUE: Multidetector CT imaging of the abdomen and pelvis was performed following the standard protocol without IV contrast. RADIATION DOSE REDUCTION: This exam was performed according to the departmental dose-optimization program which includes automated exposure control, adjustment of the mA and/or kV according to patient size and/or use of iterative reconstruction technique. COMPARISON:  CT abdomen and pelvis 10/30/2021 FINDINGS: Lower chest: Bibasilar atelectasis/scarring. Cardiomegaly. Partially visualized ICD. Hepatobiliary: Cholelithiasis without evidence of cholecystitis. Unremarkable liver. Pancreas: Unremarkable. Spleen: Unremarkable. Adrenals/Urinary Tract: Stable adrenal glands. Nonobstructing stone in the lower pole of the right kidney measuring 8 mm. No obstructing urinary calculi or hydronephrosis. Unremarkable bladder. Stomach/Bowel: Normal caliber large and small bowel. Normal appendix containing an appendicolith. Stomach is within normal limits. Small hiatal hernia. Vascular/Lymphatic: Extensive arterial vascular calcifications. Iliac artery stents bilaterally. Left common iliac vein stent. No lymphadenopathy. Reproductive: Uterus is unremarkable. Unchanged 5.5 x 5.6 cm right adnexal cyst. Other: No free intraperitoneal fluid or air. Ventral abdominal wall fat containing hernia Musculoskeletal: Chronic compression deformities of L2, L3, L4. Thoracolumbar spondylosis. No acute abnormality. IMPRESSION: 1. Nonobstructing right nephrolithiasis no obstructing urinary calculi or hydronephrosis. 2.  Cholelithiasis without evidence of cholecystitis. 3. Unchanged right adnexal cyst measuring up to 5.6 cm. This has slowly increased in size from 11/30/2020 concerning for low-grade cystic  neoplasm. 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 Recommend GYN consult, and consider pelvis MRI w/o and w/ contrast if clinically warranted. Note: This recommendation does not apply to premenarchal patients and to those with increased risk (genetic, family history, elevated tumor markers or other high-risk factors) of ovarian cancer. Reference: JACR 2020 Feb; 17(2):248-254 4.  Aortic Atherosclerosis (ICD10-I70.0). Electronically Signed   By: Minerva Fester M.D.   On: 04/27/2022 18:16   DG Chest 2 View  Result Date: 04/27/2022 CLINICAL DATA:  Hypertension and dizziness today.  History of CHF. EXAM: CHEST - 2 VIEW COMPARISON:  Chest radiographs 01/12/2022 and 10/29/2021 FINDINGS: Status post median sternotomy and CABG. Cardiac silhouette is again moderately enlarged. Moderate calcification within the aortic arch. Mildly decreased lung volumes. Bibasilar linear densities are slightly decreased from prior, likely subsegmental atelectasis and/or scarring. No definite pleural effusion. No pneumothorax. Moderate multilevel degenerative disc changes of the thoracic spine. IMPRESSION: 1. Mildly decreased lung volumes and bibasilar subsegmental atelectasis and/or scarring. Overall the lower lung aeration is mildly improved compared to the most recent 01/12/2022 radiograph. 2. Stable cardiomegaly. Electronically Signed   By: Neita Garnet M.D.   On: 04/27/2022 15:48     Assessment and Plan:   Wide QRS -in setting of too much amio for 3 months. - was AV pacing on arrival today, now more normal rhythm - still w/ vent ectopy - hold amio for now >> MD advise when to restart it - have contacted [employer] for device interrogation >> device was interrogated last pm, they are emailing it to me. - PTA on Coreg 12.5 mg bid >>  on hold  2. Chronic systolic CHF - BP has improved w/ hydration but she is not volume overloaded - wt has been stable at home  - BUN/Cr above baseline on arrival, improved w/ hydration. - fluid intake is monitored closely at home - home dose is Lasix 40 mg qd >> on hold - orthostatics not done - discuss w/ MD decreasing Lasix dose   She feels better now   Risk Assessment/Risk Scores:       New [place] Heart Association (NYHA) Functional Class NYHA Class III    For questions or updates, please contact [facility] Please consult [email or web address] for contact info under    Signed, Theodore Demark, PA-C  04/28/2022 7:45 AM  Personally seen and examined. Agree with APP above with the following comments:  Briefly 72 yo F with hx of CAD, s/p CABG, Ischemic cardiomyopathy, prior VT with [employer] ICD and with recent VT in 2024.  Was in amiodarone 400 mg PO BID.  Has had little V-Pacing  TR[facility] is treating her for dysuria and thrombocytopenia  Patient notes no symptoms.  She is resting comfortably in bed No CP, no SOB, No Palpitations No syncope.  Exam notable for decreased breath sounds in bases.  Otherwise as above.  EKG notable for sinus bradycardia with Qtc of 584 ms. She has a QRS duration of 229. Her Jtc is 355 ms. The QRS is prolonged but a component is related to her QRS widening,  PR is prolonged  Tele: ED Telemetry: Sinus rhythm RV Pacing with Slow Non sustained beats (< 100 Bpm).  On the floor, A pacing VS with  Interrogation notes no VT (VT zone set below the non sustained beats we discussed) .  Would recommend  - holding amiodarone and BB - K goal of 4, Mg goal of 2 - if still having ectopy will consult  EP tomorrow -may add back low dose amiodarone tomorrow   Riley Lam, MD FASE [facility] Cardiologist [facility]  [address] [place], [address] [place], [place] [address] [phone]  6:32 PM

## 2022-04-28 NOTE — Hospital Course (Addendum)
72 yof w/ HFpEF 25-30%, s/p ICD,atrial fibrillation, CAD, chronic respiratory failure 3 L, asthma, HLD, HTN, prolonged QTc, and right adnexal cyst who for the past 3-4 days has been feeling occasionally lightheaded and off- and her SBP per her son was in the high 90s-100. She drank some water, her BP improved slightly.  Her dizziness resolved. On 4/19 she had a PCP appointment, she took her AM medications prior to going. While waiting became dazed and confused,was poorly interactive.He alerted the nursing staff.  Her blood pressure check was 78/40 and 83/41 so was transported to the ER. The patient is maintained on Lasix for her HFrEF. She had been instructed by PCP to take an extra dose of Lasix for 3 days for mild fluid overload.  The patient took the twice daily dose for over a week before returning to her normal dose of Lasix 40 mg daily. Patient denies fever, chills, vomiting, shortness of breath or wheeze.  She endorses some nausea, spitting up, BOU, chronic dry cough, lightheadedness, dizziness, altered mentation a doctor's office. EMS gave 850 cc NS and additional 500 cc in the ED, BP had normalized, workup showed UA/2 of UTI,K: 4.3, creatinine 1.42 up from 0.9 [01/17/2022.Lokelma given by EDP. 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

## 2022-04-29 DIAGNOSIS — R9431 Abnormal electrocardiogram [ECG] [EKG]: Secondary | ICD-10-CM | POA: Diagnosis not present

## 2022-04-29 DIAGNOSIS — Z86718 Personal history of other venous thrombosis and embolism: Secondary | ICD-10-CM

## 2022-04-29 DIAGNOSIS — I10 Essential (primary) hypertension: Secondary | ICD-10-CM | POA: Diagnosis not present

## 2022-04-29 DIAGNOSIS — I5042 Chronic combined systolic (congestive) and diastolic (congestive) heart failure: Secondary | ICD-10-CM

## 2022-04-29 DIAGNOSIS — N3000 Acute cystitis without hematuria: Secondary | ICD-10-CM | POA: Diagnosis not present

## 2022-04-29 DIAGNOSIS — Z95810 Presence of automatic (implantable) cardiac defibrillator: Secondary | ICD-10-CM | POA: Diagnosis not present

## 2022-04-29 LAB — CBC
HCT: 41.1 % (ref 36.0–46.0)
Hemoglobin: 13 g/dL (ref 12.0–15.0)
MCH: 29.4 pg (ref 26.0–34.0)
MCHC: 31.6 g/dL (ref 30.0–36.0)
MCV: 93 fL (ref 80.0–100.0)
Platelets: 119 10*3/uL — ABNORMAL LOW (ref 150–400)
RBC: 4.42 MIL/uL (ref 3.87–5.11)
RDW: 14.8 % (ref 11.5–15.5)
WBC: 4.9 10*3/uL (ref 4.0–10.5)
nRBC: 0 % (ref 0.0–0.2)

## 2022-04-29 LAB — URINALYSIS, ROUTINE W REFLEX MICROSCOPIC
Bilirubin Urine: NEGATIVE
Glucose, UA: >=500 mg/dL — AB
Hgb urine dipstick: NEGATIVE
Ketones, ur: NEGATIVE mg/dL
Nitrite: NEGATIVE
Protein, ur: NEGATIVE mg/dL
Specific Gravity, Urine: 1.021 (ref 1.005–1.030)
pH: 6 (ref 5.0–8.0)

## 2022-04-29 LAB — URINE CULTURE

## 2022-04-29 LAB — BASIC METABOLIC PANEL
Anion gap: 11 (ref 5–15)
BUN: 18 mg/dL (ref 8–23)
CO2: 24 mmol/L (ref 22–32)
Calcium: 9.5 mg/dL (ref 8.9–10.3)
Chloride: 100 mmol/L (ref 98–111)
Creatinine, Ser: 1.23 mg/dL — ABNORMAL HIGH (ref 0.44–1.00)
GFR, Estimated: 47 mL/min — ABNORMAL LOW (ref >60)
Glucose, Bld: 151 mg/dL — ABNORMAL HIGH (ref 70–99)
Potassium: 4.6 mmol/L (ref 3.5–5.1)
Sodium: 135 mmol/L (ref 135–145)

## 2022-04-29 LAB — GLUCOSE, CAPILLARY
Glucose-Capillary: 247 mg/dL — ABNORMAL HIGH (ref 70–99)
Glucose-Capillary: 222 mg/dL — ABNORMAL HIGH (ref 70–99)
Glucose-Capillary: 229 mg/dL — ABNORMAL HIGH (ref 70–99)
Glucose-Capillary: 202 mg/dL — ABNORMAL HIGH (ref 70–99)
Glucose-Capillary: 194 mg/dL — ABNORMAL HIGH (ref 70–99)

## 2022-04-29 NOTE — Progress Notes (Signed)
PROGRESS NOTE [name]  ZOX:[id number] DOB: [birth year] DOA: 04/27/2022 PCP: Marva Panda, NP  Brief Narrative/Hospital Course: 13 yof w/ HFpEF 25-30%, s/p ICD,atrial fibrillation, CAD, chronic respiratory failure 3 L, asthma, HLD, HTN, prolonged QTc, and right adnexal cyst who for the past 3-4 days has been feeling occasionally lightheaded and off- and her SBP per her son was in the high 90s-100. She drank some water, her BP improved slightly.  Her dizziness resolved. On 4/19 she had a PCP appointment, she took her AM medications prior to going. While waiting became dazed and confused,was poorly interactive.He alerted the nursing staff.  Her blood pressure check was 78/40 and 83/41 so was transported to the ER. The patient is maintained on Lasix for her HFrEF. She had been instructed by PCP to take an extra dose of Lasix for 3 days for mild fluid overload.  The patient took the twice daily dose for over a week before returning to her normal dose of Lasix 40 mg daily. Patient denies fever, chills, vomiting, shortness of breath or wheeze.  She endorses some nausea, spitting up, BOU, chronic dry cough, lightheadedness, dizziness, altered mentation a doctor's office. EMS gave 850 cc NS and additional 500 cc in the ED, BP had normalized, workup showed  UTI,K: 4.3, creatinine 1.42 up from 0.9 [01/17/2022.Lokelma given by EDP. 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> recommend GYN consult/pelvis MRI if clinically warranted Chest x-ray mildly decreased lung volume bibasilar subsegmental atelectasis and/or scarring lung medicine is improved compared to before   subjective: Seen and examined this morning patient has no new complaints resting well Overnight blood pressure 120s to 140s systolic, doing well on room air afebrile heart rate  remains in 50 Labs with creatinine down to 1.2 potassium 4.6 improved, stable CBC with mild thrombocytopenia Urine culture with mixed organism repeat UA ordered  Assessment and Plan: Principal Problem:   Acute cystitis Active Problems:   Chronic combined systolic and diastolic heart failure   Acute kidney injury superimposed on chronic kidney disease   CAD (coronary artery disease)   Hypertension   Hypotension   Hyperlipidemia   ICD (implantable cardioverter-defibrillator) in place   Diabetes mellitus   History of DVT (deep vein thrombosis)   Prolonged QT interval   Chronic hypoxemic respiratory failure   Anxiety and depression   Acute cystitis: CT renal study with nonobstructive nephrolithiasis, cholelithiasis.  Continue empiric antibiotics follow-up culture data  AKI on CKD stage II: Continue IV hydration avoid nephrotoxic medication.  Creatinine slowly improving. Recent Labs  Lab 04/27/22 1511 04/28/22 0102 04/28/22 0538 04/29/22 0350  BUN 24*  --  21 18  CREATININE 1.42* 1.31* 1.33* 1.23*    Hyperkalemia: Resolved Hypokalemia will replace Diabetes mellitus with hypoglycemia overnight it has improved encourage oral intake hold insulin  Hypotension/soft blood pressure: BP improved with IV fluid.  Check orthostatics she is on Lasix Aldactone and held, also on Entresto Coreg and Imdur continue with holding parameters.  Wide  QRS Prolonged Qtc Bradycardia AICD: Asymptomatic sinus bradycardia, Coreg remains on hold> Seen by cardiology advised holding amiodarone and beta-blocker, optimizing electrolytes if still having ectopy will get EP consult .on review today doing much better-clinically feeling PVC has improved, resuming amiodarone at lower dose.  Continue to monitor in telemetry, appreciate cardiology input.   has ICD in place> interrogated.  CAD s.p CABG-no aspirin due to DOAC continue Imdur and  statin  Chronic combined systolic and diastolic CHF with EF 25 to  30%: Currently euvolemic initially needed IV fluids.  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 We discussed that her EF was 30% and so there would be higher surgical risk. We reviewed that if persistent ORADS3 we would plan to RTC 3 months with TVUS prior to continue addressing risks and benefits of surgical removal. If ORADS4 will need surgery given recent growth. "  Patient will be advised to follow-up with her GYN as outpatient  Morbid Obesity:Patient's Body mass index is 44.21 kg/m. : Will benefit with PCP follow-up, weight loss  healthy lifestyle and outpatient sleep evaluation.   DVT prophylaxis:  Code Status:   Code Status: Full Code Family Communication: plan of care discussed with patient at bedside. Patient status is: Inpatient because of UTI, bradycardia Level of care: Telemetry Medical   Dispo: The patient is from: home get ptot eval            Anticipated disposition: TBD Objective: Vitals last 24 hrs: Vitals:   04/28/22 1736 04/28/22 2019 04/28/22 2347 04/29/22 0957  BP: (!) 119/50 (!) 122/54 (!) 145/63 (!) 134/58  Pulse: (!) 50 (!) 50 (!) 50 (!) 52  Resp: 17 18 17 16   Temp: 98 F (36.7 C) 98.6 F (37 C) 97.8 F (36.6 C) 98.1 F (36.7 C)  TempSrc: Oral Oral Oral Oral  SpO2: 98% 99% 100% 97%  Weight:      Height:       Weight change:   Physical Examination: General exam: AAox3, obese, weak,older appearing HEENT:Oral mucosa moist, Ear/Nose WNL grossly, dentition normal. Respiratory system: bilaterally clear BS, no use of accessory muscle Cardiovascular system: S1 & S2 +, regular  rate,. Gastrointestinal system: Abdomen soft, NT,ND,BS+ Nervous System:Alert, awake, moving extremities and grossly nonfocal Extremities: LE ankle edema neg, lower extremities warm Skin: No rashes,no icterus. MSK: Normal muscle bulk,tone, power   Medications reviewed:  Scheduled Meds:  amiodarone  200 mg Oral Daily   apixaban  5 mg Oral BID   gabapentin  300 mg Oral QHS   insulin aspart  0-15 Units Subcutaneous TID WC   insulin aspart  0-5 Units Subcutaneous QHS   isosorbide mononitrate  30 mg Oral Daily   rosuvastatin  20 mg Oral q1800   sacubitril-valsartan  1 tablet Oral BID   sodium zirconium cyclosilicate  10 g Oral Once   Continuous Infusions:  cefTRIAXone (ROCEPHIN)  IV 1 g (04/28/22 2028)   Diet Order             Diet heart healthy/carb modified Room service appropriate? Yes; Fluid consistency: Thin  Diet effective now                  Intake/Output Summary (Last 24 hours) at 04/29/2022 1134 Last data filed at 04/29/2022 0300 Gross per 24 hour  Intake 440 ml  Output --  Net 440 ml   Net IO Since Admission: 440 mL [04/29/22 1134]  Wt Readings from Last 3 Encounters:  04/27/22 106.1 kg  03/06/22 109.3 kg  01/31/22 100.7 kg     Unresulted Labs (From admission, onward)  Start     Ordered   04/29/22 0917  Urinalysis, Routine w reflex microscopic -Urine, Clean Catch  Once,   R       Question:  Specimen Source  Answer:  Urine, Clean Catch   04/29/22 0916   04/29/22 0500  CBC  Daily,   R      04/28/22 1113   04/29/22 0500  Basic metabolic panel  Daily,   R      04/28/22 1113          Data Reviewed: I have personally reviewed following labs and imaging studies CBC: Recent Labs  Lab 04/27/22 1511 04/28/22 0102 04/28/22 0538 04/29/22 0350  WBC 4.9 5.6 5.4 4.9  NEUTROABS 2.1  --  2.8  --   HGB 12.6 12.7 12.7 13.0  HCT 38.6 39.2 39.0 41.1  MCV 93.0 93.1 92.9 93.0  PLT 147* 116* 119* 119*   Basic Metabolic Panel: Recent Labs  Lab  04/27/22 1511 04/28/22 0102 04/28/22 0538 04/29/22 0350  NA 140  --  137 135  K 5.3*  --  3.3* 4.6  CL 109  --  103 100  CO2 24  --  23 24  GLUCOSE 88  --  156* 151*  BUN 24*  --  21 18  CREATININE 1.42* 1.31* 1.33* 1.23*  CALCIUM 8.3*  --  8.8* 9.5  MG  --   --  2.3  --    GFR: Estimated Creatinine Clearance: 46.4 mL/min (A) (by C-G formula based on SCr of 1.23 mg/dL (H)). Liver Function Tests: Recent Labs  Lab 04/27/22 1511 04/28/22 0538  AST 57* 25  ALT 23 21  ALKPHOS 70 76  BILITOT 2.3* 0.6  PROT 6.5 6.9  ALBUMIN 2.9* 3.1*   Recent Labs  Lab 04/28/22 0802 04/28/22 1226 04/28/22 1736 04/28/22 2019 04/29/22 0926  GLUCAP 186* 160* 194* 171* 247*   Recent Labs  Lab 04/27/22 1518  LATICACIDVEN 1.3    Recent Results (from the past 240 hour(s))  Urine Culture     Status: Abnormal   Collection Time: 04/27/22  3:11 PM   Specimen: Urine, Clean Catch  Result Value Ref Range Status   Specimen Description URINE, CLEAN CATCH  Final   Special Requests   Final    NONE Reflexed from [id number] Performed at [facility], [address], [place], [place] [address]    Culture MULTIPLE SPECIES PRESENT, SUGGEST RECOLLECTION (A)  Final   Report Status 04/29/2022 FINAL  Final    Antimicrobials: Anti-infectives (From admission, onward)    Start     Dose/Rate Route Frequency Ordered Stop   04/28/22 1900  cefTRIAXone (ROCEPHIN) 1 g in sodium chloride 0.9 % 100 mL IVPB        1 g 200 mL/hr over 30 Minutes Intravenous Every 24 hours 04/27/22 2113     04/27/22 1945  cefTRIAXone (ROCEPHIN) 1 g in sodium chloride 0.9 % 100 mL IVPB        1 g 200 mL/hr over 30 Minutes Intravenous  Once 04/27/22 1933 04/27/22 2043      Culture/Microbiology    Component Value Date/Time   SDES URINE, CLEAN CATCH 04/27/2022 1511   SPECREQUEST  04/27/2022 1511    NONE Reflexed from [id number] Performed at [facility], [address] [place], [place] [address]    CULT MULTIPLE SPECIES  PRESENT, SUGGEST RECOLLECTION (A) 04/27/2022 1511   REPTSTATUS 04/29/2022 FINAL 04/27/2022 1511  Other culture-see note  Radiology  Studies: CT Renal Stone Study  Result Date: 04/27/2022 CLINICAL DATA:  Hypotension and dizziness.  Cloudy urine. EXAM: CT ABDOMEN AND PELVIS WITHOUT CONTRAST TECHNIQUE: Multidetector CT imaging of the abdomen and pelvis was performed following the standard protocol without IV contrast. RADIATION DOSE REDUCTION: This exam was performed according to the departmental dose-optimization program which includes automated exposure control, adjustment of the mA and/or kV according to patient size and/or use of iterative reconstruction technique. COMPARISON:  CT abdomen and pelvis 10/30/2021 FINDINGS: Lower chest: Bibasilar atelectasis/scarring. Cardiomegaly. Partially visualized ICD. Hepatobiliary: Cholelithiasis without evidence of cholecystitis. Unremarkable liver. Pancreas: Unremarkable. Spleen: Unremarkable. Adrenals/Urinary Tract: Stable adrenal glands. Nonobstructing stone in the lower pole of the right kidney measuring 8 mm. No obstructing urinary calculi or hydronephrosis. Unremarkable bladder. Stomach/Bowel: Normal caliber large and small bowel. Normal appendix containing an appendicolith. Stomach is within normal limits. Small hiatal hernia. Vascular/Lymphatic: Extensive arterial vascular calcifications. Iliac artery stents bilaterally. Left common iliac vein stent. No lymphadenopathy. Reproductive: Uterus is unremarkable. Unchanged 5.5 x 5.6 cm right adnexal cyst. Other: No free intraperitoneal fluid or air. Ventral abdominal wall fat containing hernia Musculoskeletal: Chronic compression deformities of L2, L3, L4. Thoracolumbar spondylosis. No acute abnormality. IMPRESSION: 1. Nonobstructing right nephrolithiasis no obstructing urinary calculi or hydronephrosis. 2. Cholelithiasis without evidence of cholecystitis. 3. Unchanged right adnexal cyst measuring up to 5.6 cm. This has  slowly increased in size from 11/30/2020 concerning for low-grade cystic neoplasm. 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 Recommend GYN consult, and consider pelvis MRI w/o and w/ contrast if clinically warranted. Note: This recommendation does not apply to premenarchal patients and to those with increased risk (genetic, family history, elevated tumor markers or other high-risk factors) of ovarian cancer. Reference: JACR 2020 Feb; 17(2):248-254 4.  Aortic Atherosclerosis (ICD10-I70.0). Electronically Signed   By: Minerva Fester M.D.   On: 04/27/2022 18:16   DG Chest 2 View  Result Date: 04/27/2022 CLINICAL DATA:  Hypertension and dizziness today.  History of CHF. EXAM: CHEST - 2 VIEW COMPARISON:  Chest radiographs 01/12/2022 and 10/29/2021 FINDINGS: Status post median sternotomy and CABG. Cardiac silhouette is again moderately enlarged. Moderate calcification within the aortic arch. Mildly decreased lung volumes. Bibasilar linear densities are slightly decreased from prior, likely subsegmental atelectasis and/or scarring. No definite pleural effusion. No pneumothorax. Moderate multilevel degenerative disc changes of the thoracic spine. IMPRESSION: 1. Mildly decreased lung volumes and bibasilar subsegmental atelectasis and/or scarring. Overall the lower lung aeration is mildly improved compared to the most recent 01/12/2022 radiograph. 2. Stable cardiomegaly. Electronically Signed   By: Neita Garnet M.D.   On: 04/27/2022 15:48     LOS: 2 days   Lanae Boast, MD [facility]  04/29/2022, 11:34 AM

## 2022-04-29 NOTE — Evaluation (Signed)
Occupational Therapy Evaluation Patient Details Name: [name] MRN: [id number] DOB: [birth year] Today's Date: 04/29/2022   History of Present Illness 72 year old female presents to ED via EMS from PCP with several days of lightheadedness. BP 78/40 and 83/41. Likely due to increased Lasix dosage for a week. 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   Clinical Impression   Patient admitted for the diagnosis above.  PTA she lives at her son's home, and has 7 day/week PCA assist with ADL completion.  Patient normally walks household distances with RW.  No significant OT needs exist in the acute setting, as she is most likely close to her baseline for ADL completion, and given PCA and family assist, no post acute OT is indicated.       Recommendations for follow up therapy are one component of a multi-disciplinary discharge planning process, led by the attending physician.  Recommendations may be updated based on patient status, additional functional criteria and insurance authorization.   Assistance Recommended at Discharge Intermittent Supervision/Assistance  Patient can return home with the following Assist for transportation;Assistance with cooking/housework;A little help with bathing/dressing/bathroom    Functional Status Assessment  Patient has not had a recent decline in their functional status  Equipment Recommendations  None recommended by OT    Recommendations for Other Services       Precautions / Restrictions Precautions Precautions: Fall Restrictions Weight Bearing Restrictions: No      Mobility Bed Mobility Overal bed mobility: Needs Assistance Bed Mobility: Supine to Sit, Sit to Supine     Supine to sit: Supervision Sit to supine: Min assist    General bed mobility comments: patient needs assist to bring legs on the bed at baseline    Transfers Overall transfer level: Needs assistance Equipment used: Rolling walker (2 wheels) Transfers: Sit to/from Stand, Bed to chair/wheelchair/BSC Sit to Stand: Modified independent (Device/Increase time)     Step pivot transfers: Supervision            Balance Overall balance assessment: Needs assistance Sitting-balance support: Feet supported Sitting balance-Leahy Scale: Good     Standing balance support: Reliant on assistive device for balance Standing balance-Leahy Scale: Fair                             ADL either performed or assessed with clinical judgement   ADL Overall ADL's : At baseline                                             Vision Patient Visual Report: No change from baseline       Perception     Praxis      Pertinent Vitals/Pain Pain Assessment Pain Assessment: No/denies pain     Hand Dominance Right   Extremity/Trunk Assessment Upper Extremity Assessment Upper Extremity Assessment: Overall WFL for tasks assessed   Lower Extremity Assessment Lower Extremity Assessment: Defer to PT evaluation   Cervical / Trunk Assessment Cervical / Trunk Assessment: Kyphotic   Communication Communication Communication: No difficulties   Cognition Arousal/Alertness: Awake/alert Behavior During Therapy: WFL for tasks assessed/performed Overall Cognitive Status: Within Functional Limits for tasks assessed  General Comments   VSS on RA    Exercises     Shoulder Instructions      Home Living Family/patient expects to be discharged to:: Private residence Living Arrangements: Children Available Help at Discharge: Family;Available PRN/intermittently;Other (Comment) Type of Home: House Home Access: Ramped entrance     Home Layout: One level     Bathroom  Shower/Tub: Producer, television/film/video: Standard Bathroom Accessibility: No   Home Equipment: Rollator (4 wheels);Other (comment);Shower seat          Prior Functioning/Environment Prior Level of Function : Needs assist             Mobility Comments: pt ambulates household distances with rollator ADLs Comments: minA for bathing and dressing.  Has a PCA 7days a week for up to 4 hours.        OT Problem List: Decreased activity tolerance      OT Treatment/Interventions:      OT Goals(Current goals can be found in the care plan section) Acute Rehab OT Goals Patient Stated Goal: Going back home OT Goal Formulation: With patient Time For Goal Achievement: 05/04/22 Potential to Achieve Goals: Good  OT Frequency:      Co-evaluation              AM-PAC OT "6 Clicks" Daily Activity     Outcome Measure Help from another person eating meals?: None Help from another person taking care of personal grooming?: None Help from another person toileting, which includes using toliet, bedpan, or urinal?: A Little Help from another person bathing (including washing, rinsing, drying)?: A Little Help from another person to put on and taking off regular upper body clothing?: None Help from another person to put on and taking off regular lower body clothing?: A Little 6 Click Score: 21   End of Session Equipment Utilized During Treatment: Gait belt;Rolling walker (2 wheels) Nurse Communication: Mobility status  Activity Tolerance: Patient tolerated treatment well Patient left: in bed;with call bell/phone within reach  OT Visit Diagnosis: Unsteadiness on feet (R26.81)                Time: 1610-9604 OT Time Calculation (min): 22 min Charges:  OT General Charges $OT Visit: 1 Visit OT Evaluation $OT Eval Moderate Complexity: 1 Mod  04/29/2022  RP, OTR/L  Acute Rehabilitation Services  Office:  [phone]   [name] 04/29/2022, 3:01 PM

## 2022-04-29 NOTE — Plan of Care (Signed)

## 2022-04-29 NOTE — Evaluation (Signed)
Physical Therapy Evaluation Patient Details Name: [name] MRN: [id number] DOB: [birth year] Today's Date: 04/29/2022  History of Present Illness  72 year old female presents to ED via EMS from PCP with several days of lightheadedness. BP 78/40 and 83/41. Likely due to increased Lasix dosage for a week. 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  Clinical Impression  Pt and son live in single story home with ramped entrance. Pt ambulates household distances with her RW and uses [facility] for transport to doctors appointments and has [employer] delivered. Pt has Aide 7 days a week to assist with ADLs/iADLs. Pt able to get up from bed, go to bathroom and walk around hallway at a supervision level. Only requires assist to get LE back into bed but that is her baseline. Pt is at her baseline level of function and the support that she needs. Pt does not need any further PT services. Referred pt to Mobility Specialist to retain mobility while here at the hospital.      Recommendations for follow up therapy are one component of a multi-disciplinary discharge planning process, led by the attending physician.  Recommendations may be updated based on patient status, additional functional criteria and insurance authorization.     Assistance Recommended at Discharge Intermittent Supervision/Assistance  Patient can return home with the following  A little help with bathing/dressing/bathroom;Assistance with cooking/housework;Assist for transportation    Equipment Recommendations None recommended by PT     Functional Status Assessment Patient has not had a recent decline in their functional status     Precautions / Restrictions Precautions Precautions: Fall Restrictions Weight Bearing  Restrictions: No      Mobility  Bed Mobility Overal bed mobility: Needs Assistance Bed Mobility: Supine to Sit, Sit to Supine     Supine to sit: Supervision Sit to supine: Min assist   General bed mobility comments: patient needs assist to bring legs on the bed at baseline    Transfers Overall transfer level: Needs assistance Equipment used: Rolling walker (2 wheels) Transfers: Sit to/from Stand, Bed to chair/wheelchair/BSC Sit to Stand: Modified independent (Device/Increase time)                Ambulation/Gait Ambulation/Gait assistance: Supervision Gait Distance (Feet): 100 Feet Assistive device: Rolling walker (2 wheels) Gait Pattern/deviations: WFL(Within Functional Limits), Step-through pattern, Shuffle, Trunk flexed Gait velocity: appropriate Gait velocity interpretation: 1.31 - 2.62 ft/sec, indicative of limited community ambulator   General Gait Details: supervision for slowed, steady ambulation        Balance Overall balance assessment: Needs assistance Sitting-balance support: Feet supported Sitting balance-Leahy Scale: Good     Standing balance support: Reliant on assistive device for balance Standing balance-Leahy Scale: Fair                               Pertinent Vitals/Pain Pain Assessment Pain Assessment: Faces Faces Pain Scale: Hurts little more Pain Location: L knee (long standing, increases with rain) Pain Descriptors / Indicators: Aching, Nagging, Sore Pain Intervention(s): Limited activity within patient's tolerance, Monitored during session, Repositioned    Home Living Family/patient expects to be discharged to:: Private residence Living Arrangements: Children Available Help at Discharge: Family;Available PRN/intermittently;Other (Comment) Type of Home: House Home Access: Ramped entrance  Home Layout: One level Home Equipment: Rollator (4 wheels);Other (comment);Shower seat      Prior Function Prior Level  of Function : Needs assist             Mobility Comments: pt ambulates household distances with rollator ADLs Comments: minA for bathing and dressing.  Has a PCA 7days a week for up to 4 hours.     Hand Dominance   Dominant Hand: Right    Extremity/Trunk Assessment   Upper Extremity Assessment Upper Extremity Assessment: Defer to OT evaluation    Lower Extremity Assessment Lower Extremity Assessment: Overall WFL for tasks assessed    Cervical / Trunk Assessment Cervical / Trunk Assessment: Kyphotic  Communication   Communication: No difficulties  Cognition Arousal/Alertness: Awake/alert Behavior During Therapy: WFL for tasks assessed/performed Overall Cognitive Status: Within Functional Limits for tasks assessed                                          General Comments General comments (skin integrity, edema, etc.): VSS on RA,        Assessment/Plan    PT Assessment Patient needs continued PT services;Patient does not need any further PT services  PT Problem List Decreased activity tolerance;Pain           PT Goals (Current goals can be found in the Care Plan section)  Acute Rehab PT Goals PT Goal Formulation: All assessment and education complete, DC therapy     AM-PAC PT "6 Clicks" Mobility  Outcome Measure Help needed turning from your back to your side while in a flat bed without using bedrails?: None Help needed moving from lying on your back to sitting on the side of a flat bed without using bedrails?: None Help needed moving to and from a bed to a chair (including a wheelchair)?: None Help needed standing up from a chair using your arms (e.g., wheelchair or bedside chair)?: None Help needed to walk in hospital room?: None Help needed climbing 3-5 steps with a railing? : A Lot 6 Click Score: 22    End of Session   Activity Tolerance: Patient tolerated treatment well Patient left: in bed;with call bell/phone within reach;with  nursing/sitter in room Nurse Communication: Mobility status PT Visit Diagnosis: Muscle weakness (generalized) (M62.81)    Time: 1610-9604 PT Time Calculation (min) (ACUTE ONLY): 19 min   Charges:   PT Evaluation $PT Eval Moderate Complexity: 1 Mod          [name] Yearwood B. Beverely Risen PT, DPT Acute Rehabilitation Services Please use secure chat or  Call Office [phone]   Elon Alas [facility] 04/29/2022, 5:53 PM

## 2022-04-29 NOTE — Progress Notes (Signed)
Progress Note  Patient Name: [name] Date of Encounter: 04/29/2022  Primary Cardiologist: Donato Schultz, MD   Subjective   Overnight no issues Patient notes that she feels much better No CP, SOB, Palpitations.  Inpatient Medications    Scheduled Meds:  amiodarone  200 mg Oral Daily   apixaban  5 mg Oral BID   gabapentin  300 mg Oral QHS   insulin aspart  0-15 Units Subcutaneous TID WC   insulin aspart  0-5 Units Subcutaneous QHS   isosorbide mononitrate  30 mg Oral Daily   rosuvastatin  20 mg Oral q1800   sacubitril-valsartan  1 tablet Oral BID   sodium zirconium cyclosilicate  10 g Oral Once   Continuous Infusions:  cefTRIAXone (ROCEPHIN)  IV 1 g (04/28/22 2028)   PRN Meds: acetaminophen **OR** acetaminophen, albuterol, senna-docusate   Vital Signs    Vitals:   04/28/22 1359 04/28/22 1736 04/28/22 2019 04/28/22 2347  BP: (!) 140/64 (!) 119/50 (!) 122/54 (!) 145/63  Pulse: (!) 50 (!) 50 (!) 50 (!) 50  Resp: Temp: 97.6 F (36.4 C) 98 F (36.7 C) 98.6 F (37 C) 97.8 F (36.6 C)  TempSrc: Oral Oral Oral Oral  SpO2: 100% 98% 99% 100%  Weight:      Height:        Intake/Output Summary (Last 24 hours) at 04/29/2022 0815 Last data filed at 04/29/2022 0300 Gross per 24 hour  Intake 440 ml  Output --  Net 440 ml   Filed Weights   04/27/22 1455  Weight: 106.1 kg    Telemetry    Predominantly A Pacing, VS; rare PVC: NSVT is artifact - Personally Reviewed  ECG    Sinus bradycardia with 1st HB, QRS duration 175/LBBB, Qtc 489, Jtc 320 ms; no PVCs - Personally Reviewed  Physical Exam   Gen: no distress, morbid obesity   Neck: No JVD Cardiac: No Rubs or Gallops, no murmur, regular bradycardia, +2 radial pulses Respiratory: Clear to auscultation bilaterally, normal effort, normal  respiratory rate GI: Soft, nontender, non-distended  MS: No  edema;  moves all extremities Integument: Skin feels warm Neuro:  At time of evaluation,  alert and oriented to person/place/time/situation  Psych: Normal affect, patient feels well   Labs    Chemistry Recent Labs  Lab 04/27/22 1511 04/28/22 0102 04/28/22 0538 04/29/22 0350  NA 140  --  137 135  K 5.3*  --  3.3* 4.6  CL 109  --  103 100  CO2 24  --  23 24  GLUCOSE 88  --  156* 151*  BUN 24*  --  21 18  CREATININE 1.42* 1.31* 1.33* 1.23*  CALCIUM 8.3*  --  8.8* 9.5  PROT 6.5  --  6.9  --   ALBUMIN 2.9*  --  3.1*  --   AST 57*  --  25  --   ALT 23  --  21  --   ALKPHOS 70  --  76  --   BILITOT 2.3*  --  0.6  --   GFRNONAA 39* 43* 43* 47*  ANIONGAP 7  --  11 11     Hematology Recent Labs  Lab 04/28/22 0102 04/28/22 0538 04/29/22 0350  WBC 5.6 5.4 4.9  RBC 4.21 4.20 4.42  HGB 12.7 12.7 13.0  HCT 39.2 39.0 41.1  MCV 93.1 92.9 93.0  MCH 30.2 30.2 29.4  MCHC 32.4 32.6 31.6  RDW 15.0  15.0 14.8  PLT 116* 119* 119*    Cardiac EnzymesNo results for input(s): "TROPONINI" in the last 168 hours. No results for input(s): "TROPIPOC" in the last 168 hours.   BNP Recent Labs  Lab 04/27/22 1511  BNP 81.7     DDimer No results for input(s): "DDIMER" in the last 168 hours.   Radiology    CT Renal Stone Study  Result Date: 04/27/2022 CLINICAL DATA:  Hypotension and dizziness.  Cloudy urine. EXAM: CT ABDOMEN AND PELVIS WITHOUT CONTRAST TECHNIQUE: Multidetector CT imaging of the abdomen and pelvis was performed following the standard protocol without IV contrast. RADIATION DOSE REDUCTION: This exam was performed according to the departmental dose-optimization program which includes automated exposure control, adjustment of the mA and/or kV according to patient size and/or use of iterative reconstruction technique. COMPARISON:  CT abdomen and pelvis 10/30/2021 FINDINGS: Lower chest: Bibasilar atelectasis/scarring. Cardiomegaly. Partially visualized ICD. Hepatobiliary: Cholelithiasis without evidence of cholecystitis. Unremarkable liver. Pancreas: Unremarkable.  Spleen: Unremarkable. Adrenals/Urinary Tract: Stable adrenal glands. Nonobstructing stone in the lower pole of the right kidney measuring 8 mm. No obstructing urinary calculi or hydronephrosis. Unremarkable bladder. Stomach/Bowel: Normal caliber large and small bowel. Normal appendix containing an appendicolith. Stomach is within normal limits. Small hiatal hernia. Vascular/Lymphatic: Extensive arterial vascular calcifications. Iliac artery stents bilaterally. Left common iliac vein stent. No lymphadenopathy. Reproductive: Uterus is unremarkable. Unchanged 5.5 x 5.6 cm right adnexal cyst. Other: No free intraperitoneal fluid or air. Ventral abdominal wall fat containing hernia Musculoskeletal: Chronic compression deformities of L2, L3, L4. Thoracolumbar spondylosis. No acute abnormality. IMPRESSION: 1. Nonobstructing right nephrolithiasis no obstructing urinary calculi or hydronephrosis. 2. Cholelithiasis without evidence of cholecystitis. 3. Unchanged right adnexal cyst measuring up to 5.6 cm. This has slowly increased in size from 11/30/2020 concerning for low-grade cystic neoplasm. 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 Recommend GYN consult, and consider pelvis MRI w/o and w/ contrast if clinically warranted. Note: This recommendation does not apply to premenarchal patients and to those with increased risk (genetic, family history, elevated tumor markers or other high-risk factors) of ovarian cancer. Reference: JACR 2020 Feb; 17(2):248-254 4.  Aortic Atherosclerosis (ICD10-I70.0). Electronically Signed   By: Minerva Fester M.D.   On: 04/27/2022 18:16   DG Chest 2 View  Result Date: 04/27/2022 CLINICAL DATA:  Hypertension and dizziness today.  History of CHF. EXAM: CHEST - 2 VIEW COMPARISON:  Chest radiographs 01/12/2022 and 10/29/2021 FINDINGS: Status post median sternotomy and CABG. Cardiac silhouette is again moderately enlarged. Moderate calcification within the aortic arch. Mildly decreased  lung volumes. Bibasilar linear densities are slightly decreased from prior, likely subsegmental atelectasis and/or scarring. No definite pleural effusion. No pneumothorax. Moderate multilevel degenerative disc changes of the thoracic spine. IMPRESSION: 1. Mildly decreased lung volumes and bibasilar subsegmental atelectasis and/or scarring. Overall the lower lung aeration is mildly improved compared to the most recent 01/12/2022 radiograph. 2. Stable cardiomegaly. Electronically Signed   By: Neita Garnet M.D.   On: 04/27/2022 15:48      Patient Profile     72 y.o. female w/PMHx of CAD (CABG 2007), ICM, chronic CHF (systlic), DM (w/hx of DKA), DVT, HTN, HLD, iLBBB, obesity, OSA (w/O2 at HS), VT, ICD   Assessment & Plan    Prolonged QT Recent VF arrest S/p ICD with intermittent LBBB - her coreg has been held foe bradycardia and hypotension - restarting lower dose of her amiodarone today; improvement in Qtc - K goal 4, Mg goal of 2  CAD s/p CABG -  no ASA due to DOAC - on Imdur - continue statin  HFrEF - Chronic euvolemic; most of her GDMT was held for AKI and for hypotension related to UTI - continuing her IMDUR, when AKI resolves (baseline creatinine 0.9-1.15)  - back on her ARNI; if Bernerd Limbo continues to improve would restart her SGLT2i  Hx of DVT - as per primary; she is on Eliquis    For questions or updates, please contact [facility] Please consult [email or web address] for contact info under Cardiology/STEMI.      Riley Lam, MD FASE [facility] Cardiologist [facility]  [address] [place], [address] [place], [place] [address] [phone]  8:15 AM

## 2022-04-30 DIAGNOSIS — R0989 Other specified symptoms and signs involving the circulatory and respiratory systems: Secondary | ICD-10-CM

## 2022-04-30 DIAGNOSIS — N3000 Acute cystitis without hematuria: Secondary | ICD-10-CM | POA: Diagnosis not present

## 2022-04-30 DIAGNOSIS — R9431 Abnormal electrocardiogram [ECG] [EKG]: Secondary | ICD-10-CM | POA: Diagnosis not present

## 2022-04-30 LAB — CBC
HCT: 38.3 % (ref 36.0–46.0)
Hemoglobin: 12.6 g/dL (ref 12.0–15.0)
MCH: 30.1 pg (ref 26.0–34.0)
MCHC: 32.9 g/dL (ref 30.0–36.0)
MCV: 91.6 fL (ref 80.0–100.0)
Platelets: 123 10*3/uL — ABNORMAL LOW (ref 150–400)
RBC: 4.18 MIL/uL (ref 3.87–5.11)
RDW: 14.9 % (ref 11.5–15.5)
WBC: 5.4 10*3/uL (ref 4.0–10.5)
nRBC: 0 % (ref 0.0–0.2)

## 2022-04-30 LAB — BASIC METABOLIC PANEL
Anion gap: 8 (ref 5–15)
BUN: 19 mg/dL (ref 8–23)
CO2: 25 mmol/L (ref 22–32)
Calcium: 9.5 mg/dL (ref 8.9–10.3)
Chloride: 104 mmol/L (ref 98–111)
Creatinine, Ser: 1.43 mg/dL — ABNORMAL HIGH (ref 0.44–1.00)
GFR, Estimated: 39 mL/min — ABNORMAL LOW (ref >60)
Glucose, Bld: 161 mg/dL — ABNORMAL HIGH (ref 70–99)
Potassium: 4.7 mmol/L (ref 3.5–5.1)
Sodium: 137 mmol/L (ref 135–145)

## 2022-04-30 LAB — GLUCOSE, CAPILLARY
Glucose-Capillary: 174 mg/dL — ABNORMAL HIGH (ref 70–99)
Glucose-Capillary: 164 mg/dL — ABNORMAL HIGH (ref 70–99)
Glucose-Capillary: 247 mg/dL — ABNORMAL HIGH (ref 70–99)
Glucose-Capillary: 260 mg/dL — ABNORMAL HIGH (ref 70–99)

## 2022-04-30 NOTE — Care Management Important Message (Signed)
Important Message  Patient Details  Name: [name] MRN: [id number] Date of Birth: [birth year]   Medicare Important Message Given:  Yes     Sherilyn Banker 04/30/2022, 1:07 PM

## 2022-04-30 NOTE — Progress Notes (Signed)
   04/30/22 1102  SDOH Interventions  Food Insecurity Interventions Inpatient TOC;Other (Comment) (Resources added to AVS.)   CSW met with pt at bedside to discuss SDOH for food insecurity. Pt states she usually has enough food, but occasionally doesn't have enough money or transportation to get food. Pt states her son and daughter assist with this, one of them will pick her up at [place]. Pt states she had meals on wheels, but now doesn't qualify. Pt states she has transportation services. Pt agreeable to resources being added to the AVS.

## 2022-04-30 NOTE — Progress Notes (Signed)
PROGRESS NOTE [name]  ZOX:[id number] DOB: May 16, 1950 DOA: 04/27/2022 PCP: Marva Panda, NP  Brief Narrative/Hospital Course: 89 yof w/ HFpEF 25-30%, s/p ICD,atrial fibrillation, CAD, chronic respiratory failure 3 L, asthma, HLD, HTN, prolonged QTc, and right adnexal cyst who for the past 3-4 days has been feeling occasionally lightheaded and off- and her SBP per her son was in the high 90s-100. She drank some water, her BP improved slightly.  Her dizziness resolved. On 4/19 she had a PCP appointment, she took her AM medications prior to going. While waiting became dazed and confused,was poorly interactive.He alerted the nursing staff.  Her blood pressure check was 78/40 and 83/41 so was transported to the ER. The patient is maintained on Lasix for her HFrEF. She had been instructed by PCP to take an extra dose of Lasix for 3 days for mild fluid overload.  The patient took the twice daily dose for over a week before returning to her normal dose of Lasix 40 mg daily. Patient denies fever, chills, vomiting, shortness of breath or wheeze.  She endorses some nausea, spitting up, BOU, chronic dry cough, lightheadedness, dizziness, altered mentation a doctor's office. EMS gave 850 cc NS and additional 500 cc in the ED, BP had normalized, workup showed  UTI,K: 4.3, creatinine 1.42 up from 0.9 [01/17/2022.Lokelma given by EDP. 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> recommend GYN consult/pelvis MRI if clinically warranted Chest x-ray mildly decreased lung volume bibasilar subsegmental atelectasis and/or scarring lung medicine is improved compared to before   subjective: Seen this am Feels well no complaints Some dysurea no nausea or vomiting or chest paiin Creat is trended up some  Assessment and Plan: Principal Problem:   Acute  cystitis Active Problems:   Chronic combined systolic and diastolic heart failure   Acute kidney injury superimposed on chronic kidney disease   CAD (coronary artery disease)   Hypertension   Hypotension   Hyperlipidemia   ICD (implantable cardioverter-defibrillator) in place   Diabetes mellitus   History of DVT (deep vein thrombosis)   Prolonged QT interval   Chronic hypoxemic respiratory failure   Anxiety and depression   Acute cystitis: CT renal study with nonobstructive nephrolithiasis, cholelithiasis.  Continue empiric antibiotics. Urine cx mixed organish.resent.  AKI on CKD stage II:  creat slightly up 1.4 b/l 0.9-1.1. on entresto- adjust meds per cardio- encourage iral hydratin. Hold diuretics Recent Labs    01/14/22 0052 01/15/22 0034 01/16/22 0115 01/17/22 0027 01/26/22 1458 01/31/22 1501 04/27/22 1511 04/28/22 0102 04/28/22 0538 04/29/22 0350 04/30/22 0154  BUN 22 20 19 18 15 23  24*  --  21 18 19   CREATININE 1.09* 1.04* 0.88 0.99 1.07* 1.15* 1.42* 1.31* 1.33* 1.23* 1.43*     Hyperkalemia: Resolved Hypokalemia resolved  Diabetes mellitus with hypoglycemia:resolved. Holding long acting insulin, keep on SSI. On continuous glucose monitoring Recent Labs  Lab 04/29/22 1137 04/29/22 1221 04/29/22 1822 04/29/22 2054 04/30/22 0755  GLUCAP 229* 222* 202* 194* 174*     Hypotension/soft blood pressure: BP improved with IV fluid.   holding Lasix Aldactone, coreg. Back on Entresto, Imdur.  Wide  QRS Prolonged Qtc Bradycardia AICD: Asymptomatic sinus bradycardia. , Coreg remains on hold> Seen by cardiology- back on amiodarone. Continue to monitor in telemetry, appreciate cardiology input.   has ICD in place> interrogated.  CAD s.p CABG-no aspirin due to DOAC continue Imdur and statin.  Chronic  combined systolic and diastolic CHF with EF 25 to 30%: Currently euvolemic initially needed IV fluids.Continue GDMT per cardiology ocne aki better. Cont Entresto, imdur,  others on hold Net IO Since Admission: 800 mL [04/30/22 0908]  Filed Weights   04/27/22 1455  Weight: 106.1 kg    Right adnexal mass:right adnexal cyst-5.6 cm slightly increased from 11/2020 concerning for low-grade cystic neoplasm> recommend GYN consult/pelvis MRI if clinically warranted>Patient follows with gyn Kendell Bane: Per GYN note from 10/04/2021: "She remains asymptomatic. We discussed that her EF was 30% and so there would be higher surgical risk. We reviewed that if persistent ORADS3 we would plan to RTC 3 months with TVUS prior to continue addressing risks and benefits of surgical removal. If ORADS4 will need surgery given recent growth. "  Patient will be advised to follow-up with her GYN as outpatient  Morbid Obesity:Body mass index is 44.21 kg/m. :Will benefit with PCP follow-up, weight loss  healthy lifestyle and outpatient sleep evaluation.   DVT prophylaxis:  Code Status:   Code Status: Full Code Family Communication: plan of care discussed with patient at bedside. Son updated this am Patient status is: Inpatient because of UTI, bradycardia Level of care: Telemetry Medical   Dispo: The patient is from: home get ptot eval            Anticipated disposition: Home- did well w/ PTOT Objective: Vitals last 24 hrs: Vitals:   04/29/22 0957 04/29/22 1744 04/29/22 2051 04/30/22 0503  BP: (!) 134/58 (!) 121/54 (!) 119/52 (!) 139/59  Pulse: (!) 52 (!) 55 (!) 53 (!) 51  Resp: Temp: 98.1 F (36.7 C) 98.5 F (36.9 C) 98.2 F (36.8 C) 98.4 F (36.9 C)  TempSrc: Oral Oral Oral Oral  SpO2: 97% 98% 100% 99%  Weight:      Height:       Weight change:   Physical Examination: General exam: AAOX3,weak,older appearing HEENT:Oral mucosa moist, Ear/Nose WNL grossly, dentition normal. Respiratory system: bilaterally clear BS, no use of accessory muscle Cardiovascular system: S1 & S2 +, regular rate,. Gastrointestinal system: Abdomen soft, NT,ND,BS+ Nervous System:Alert,  awake, moving extremities and grossly nonfocal Extremities: LE ankle edema neg, lower extremities warm Skin: No rashes,no icterus. MSK: Normal muscle bulk,tone, power   Medications reviewed:  Scheduled Meds:  amiodarone  200 mg Oral Daily   apixaban  5 mg Oral BID   gabapentin  300 mg Oral QHS   insulin aspart  0-15 Units Subcutaneous TID WC   insulin aspart  0-5 Units Subcutaneous QHS   isosorbide mononitrate  30 mg Oral Daily   rosuvastatin  20 mg Oral q1800   sacubitril-valsartan  1 tablet Oral BID   Continuous Infusions:  cefTRIAXone (ROCEPHIN)  IV 1 g (04/29/22 2101)   Diet Order             Diet heart healthy/carb modified Room service appropriate? Yes; Fluid consistency: Thin  Diet effective now                  Intake/Output Summary (Last 24 hours) at 04/30/2022 0908 Last data filed at 04/30/2022 0800 Gross per 24 hour  Intake 360 ml  Output --  Net 360 ml    Net IO Since Admission: 800 mL [04/30/22 0908]  Wt Readings from Last 3 Encounters:  04/27/22 106.1 kg  03/06/22 109.3 kg  01/31/22 100.7 kg     Unresulted Labs (From admission, onward)     Start  Ordered   04/30/22 0852  Urine Culture (for pregnant, neutropenic or urologic patients or patients with an indwelling urinary catheter)  (Urine Labs)  Add-on,   AD       Question:  Indication  Answer:  Dysuria   04/30/22 0851   04/29/22 0500  CBC  Daily,   R      04/28/22 1113   04/29/22 0500  Basic metabolic panel  Daily,   R      04/28/22 1113          Data Reviewed: I have personally reviewed following labs and imaging studies CBC: Recent Labs  Lab 04/27/22 1511 04/28/22 0102 04/28/22 0538 04/29/22 0350 04/30/22 0154  WBC 4.9 5.6 5.4 4.9 5.4  NEUTROABS 2.1  --  2.8  --   --   HGB 12.6 12.7 12.7 13.0 12.6  HCT 38.6 39.2 39.0 41.1 38.3  MCV 93.0 93.1 92.9 93.0 91.6  PLT 147* 116* 119* 119* 123*    Basic Metabolic Panel: Recent Labs  Lab 04/27/22 1511 04/28/22 0102 04/28/22 0538  04/29/22 0350 04/30/22 0154  NA 140  --  137 135 137  K 5.3*  --  3.3* 4.6 4.7  CL 109  --  103 100 104  CO2 24  --  23 24 25   GLUCOSE 88  --  156* 151* 161*  BUN 24*  --  21 18 19   CREATININE 1.42* 1.31* 1.33* 1.23* 1.43*  CALCIUM 8.3*  --  8.8* 9.5 9.5  MG  --   --  2.3  --   --     GFR: Estimated Creatinine Clearance: 39.9 mL/min (A) (by C-G formula based on SCr of 1.43 mg/dL (H)). Liver Function Tests: Recent Labs  Lab 04/27/22 1511 04/28/22 0538  AST 57* 25  ALT 23 21  ALKPHOS 70 76  BILITOT 2.3* 0.6  PROT 6.5 6.9  ALBUMIN 2.9* 3.1*    Recent Labs  Lab 04/29/22 1137 04/29/22 1221 04/29/22 1822 04/29/22 2054 04/30/22 0755  GLUCAP 229* 222* 202* 194* 174*    Recent Labs  Lab 04/27/22 1518  LATICACIDVEN 1.3     Recent Results (from the past 240 hour(s))  Urine Culture     Status: Abnormal   Collection Time: 04/27/22  3:11 PM   Specimen: Urine, Clean Catch  Result Value Ref Range Status   Specimen Description URINE, CLEAN CATCH  Final   Special Requests   Final    NONE Reflexed from [id number] Performed at [facility], [address] [place], [place] [address]    Culture MULTIPLE SPECIES PRESENT, SUGGEST RECOLLECTION (A)  Final   Report Status 04/29/2022 FINAL  Final    Antimicrobials: Anti-infectives (From admission, onward)    Start     Dose/Rate Route Frequency Ordered Stop   04/28/22 1900  cefTRIAXone (ROCEPHIN) 1 g in sodium chloride 0.9 % 100 mL IVPB        1 g 200 mL/hr over 30 Minutes Intravenous Every 24 hours 04/27/22 2113     04/27/22 1945  cefTRIAXone (ROCEPHIN) 1 g in sodium chloride 0.9 % 100 mL IVPB        1 g 200 mL/hr over 30 Minutes Intravenous  Once 04/27/22 1933 04/27/22 2043      Culture/Microbiology    Component Value Date/Time   SDES URINE, CLEAN CATCH 04/27/2022 1511   SPECREQUEST  04/27/2022 1511    NONE Reflexed from [id number] Performed at [facility], [address] [place], [place] [address]    CULT  MULTIPLE SPECIES PRESENT, SUGGEST RECOLLECTION (A) 04/27/2022 1511   REPTSTATUS 04/29/2022 FINAL 04/27/2022 1511  Other culture-see note  Radiology Studies: No results found.   LOS: 3 days   Lanae Boast, MD [facility]  04/30/2022, 9:08 AM

## 2022-04-30 NOTE — Discharge Instructions (Signed)
Sunday, by APPOINTMENT ONLY  [facility], [address], [address], [phone], 3.2 mi from [place], call in advance for appointment at 10:00am or at 4:00pm, must provide valid photo ID  Monday  9:30am-5:00pm [facility], [address] [address], [phone], 0.9 mi from [place], can come four times per year, bring your photo ID and SS cards for other residents of household, will make appointments for those who work and need to come after 5pm  10:00am-12:00noon [facility], [address], [address], [phone], 1.7 mi from [place], can come once every 60 days per household, need referral from DSS, [facility], etc., bring photo ID and SS card   10:00am-1:00pm [facility],  [address], [address], [phone], 7.7 mi from [place], can come once every thirty days with a referral from DSS, [facility], Mental Health, etc. -- each referral good for six visits, bring photo ID   10:00am-1:00pm [facility], [address], [address], [phone], 4.2 mi from [place], can come once every 6 months, open to [place] residents, bring photo ID and copy of a current utility bill in your name, please call first to verify that food is available  6:30pm-8:30pm [facility], [address], [address], [phone], 3.2 mi from [place], can come once every 30 days, maximum 6 times per year, bring your photo ID and SS numbers for other residents of household  Monday by APPOINTMENT ONLY  [facility], [address], [address],  [phone], 2.5 mi from [place], call in advance for appointment between 10:00am-2:00pm, bring your photo ID and SS cards for all residents of household, can come once every 3 months  [facility], [address], [address], [phone], 0.7 mi from  [place], call in advance for appointment, can come once every 30 days, bring your photo ID and SS cards for other residents of household   Tuesday  9:00am-12:00noon [facility], [address], [address], [phone], 1.3 mi from [place], can come once every 3 months, bring your photo ID and SS numbers for other residents of household   9:00am-1:00pm [facility],[address],  [place], [address], [phone]/Ext [phone], 1.6 mi from [place], can come once every two weeks  9:30am-5:00pm [facility], [address] [address], [phone], 0.9 mi from [place], can come four times per year, bring your photo ID and SS cards for other residents of household, will make appointments for those who work and need to come after 5pm  10:00am-12:00noon [facility], [address], [address], [phone], 1.7 mi from [place], can come once every 60 days per household, need referral from DSS, [facility], etc., bring photo ID and SS card  10:00am-1:00pm [name], [address], [address],  [phone], 3.8 mi from [place], with referral from DSS, may come six times, 30 days apart, bring your photo ID and SS cards for all residents of household   10:00am-1:00pm [facility], [address], [address], [phone], 7.7 mi from [place], can come once every thirty days with a referral from DSS,   [facility], Mental Health, etc.- each referral good for six visits, bring photo ID   10:00am-1:00pm [facility], [address], [address], [phone], 4.2 mi from [place], can come once every 6 months, open to [place] residents, bring photo ID and copy of a current utility bill in your name, please call first to verify that food is available  2:00pm-3:30pm [facility], [address], [address],  [phone], 3.7 mi from [place], can come twelve times per year, one bag per family, bring photo ID   FIRST AND THIRD Tuesdays  10:00am-1:00pm, [facility], [address], [address], [phone], 7.2 mi from [place], can come once every 30 days   Tuesday, WHEN FOOD IS AVAILABLE (call)  12:00noon-2:00pm [facility], [address], [address], [phone], 1.5 mi from  [place], can come once every 30 days, bring photo ID   Tuesday, by APPOINTMENT ONLY  [facility], [address], [address],  [phone], 2.5 mi from [place], call in advance for appointment between 1:00pm-4:00pm, bring your photo ID and SS cards for all residents of household, can come once every 3 months   [facility], [address], [address], [phone], 5 mi from [place], call one day ahead for appointment the next day between 10:00am and 12:00noon, can come once every 3 months, bring your photo ID and must qualify according to family income   [facility], [address], [address], [phone], 0.7 mi from [place], call in advance for appointment, can come once every 30 days, bring your photo ID and SS cards for other residents of household   [facility], [address], [address],  [phone], 5.1 mi from [place], call between 9:00am and 1:00pm M-F to make appointment. Appointments are scheduled for Tues and Thurs from 10:00am-11:30am, bring photo ID, can come once every 6 months, limit three visits over 18 months, then must have referral  Wednesday  9:30am-5:00pm [facility], [address] [address], [phone], 0.9 mi from [place], can come four times per year, bring your photo ID and SS cards for other residents of household, will make appointments for those who work and need to come after  5pm  9:30am-11:30am [facility], [address], [address], [phone], 6.6 mi from [place], can come once every 30 days, bring your photo ID, and SS cards for other residents of household, each monthly visit requires a written referral from [facility] or DSS with number in household on form  10:00am-12:00noon [facility], [address], [address], [phone], 1.7 mi from [place], can come once every 60 days per household, need referral from DSS, [facility], etc., bring photo ID and SS card  10:00am-1:00pm [name], [address], [address],  [phone], 3.9 mi from [place], with referral from DSS, may come six times, 30 days apart, bring your photo ID and SS cards for all residents of household   10:00am-1:00pm [facility], [address], [address], [phone], 7.7 mi from [place], can come once every thirty days with a referral from DSS, [facility], Mental Health,   etc. - each referral good for six visits, bring photo ID   2:00pm-5:45pm [facility], [address], [address], [phone], 3.2 mi from [place], once every 30 days, first come/first served, limited to first 25, bring photo ID   6:30pm-8:30pm [facility], [address], [address], [phone], 3.2 mi from [place], can come once every 30 days, maximum 6 times per year, bring your photo ID and SS numbers for other residents of household  FIRST and THIRD Wednesdays   9:00am-12:00noon, [facility], [address], [address], [phone], 4.2 mi from [place], can come once a month. Please arrive and sign in no later than 11:15 so everyone can be served by 12 noon.  THIRD Wednesday  1:30pm-3:00pm, [facility], [address], [address], [phone] or [phone], 2.1 mi from [place]   Wednesday, by APPOINTMENT ONLY  [facility], [address], [address], [phone], 5 mi from [place], call one day ahead for appointment the next day between 10:00am and 12:00noon, can come once every 3 months, bring your photo ID and must qualify according to family income   [facility], [address], [address], [phone], 0.7 mi from [place], call in advance for appointment, can come once every 30 days, bring your photo ID and SS cards for other residents of household   [facility] [facility], [address], [address], [phone], 3.2 mi from [place], call in advance for appointment at 7:00pm, must provide valid photo ID  Thursday  9:00am-12:00noon [facility], [address], [address], [phone], 1.3 mi from [place], can come once every 3 months, bring your photo ID and SS numbers for other residents of household   9:00am-1:00pm [facility],[address],  [place], [address], [phone]/Ext [phone], 1.6 mi from [place], can come once every two weeks  9:30am-12:00noon [facility], [address], [address], [phone], 4.5 mi from [place], can come once every 30 days, must state income [closed Thanksgiving and week of Christmas]  9:30am-5:00pm [facility], [address] [address], [phone], 0.9 mi from [place], can come four times per year, bring your photo ID and SS card for other residents of household, will make appointments for those who work and need to come after 5pm  10:00am-1:00pm [name], [address], [address],  [phone], 3.9 mi from [place], with referral from DSS, may come six times, 30 days apart, bring your photo ID and SS cards for all residents of household   10:00am-1:00pm [facility], [address], [address], [phone], 7.7 mi from [place], can  come once every thirty days with a referral from DSS, [facility], Mental Health, etc.- each referral good for six visits, bring photo ID   10:00am-1:00pm [facility], [address], [address], [phone], 4.2 mi from [place], can come once every 6 months, open to [place] residents, bring photo ID and copy of a current utility bill in your name, please call first to verify that food is available   SUNDAYS BREAKFAST TWO LOCATIONS: 8:00am served in [place] by [facility] 8:30am SHUTTLE provided from [place], served   at [facility], [address] LUNCH TWO LOCATIONS [plus one additional third Sunday only] 10:30am - 12:30pm served at [facility], [facility], [address] (1.2 miles from [place]) 12:30pm served in [place] by [employer] (THIRD Sunday only) 1:30pm served at [place] by [facility] DINNER one location [plus one additional third Sunday only] 5:00pm Every Sunday, served under the bridge at [address] by [facility] (.7 miles from [place]) (THIRD Sunday ONLY) 4:00pm served in the parking garage, across from [place], corner of [address] by [facility] MONDAYS BREAKFAST 7:30am served in [place] by the [employer] LUNCH 10:30am - 12:30pm served at [facility], [facility], [address] (1.2 miles from [place]) DINNER TWO LOCATIONS: 7:00pm served in front of the courthouse at the corner of [address] by [employer] Monday Night Meal (3 blocks from [place]) 4:30pm served at the [facility], [address] by [employer] (0.6 miles from [place]) TUESDAYS BREAKFAST 8:00am - 9:00am served at [facility], [address] (0.3 miles  from [place]) LUNCH 10:30am - 12:30pm served at the [facility], [facility] [address], (1.2 miles from [place]) DINNER 6:00pm served at [facility], enter from [address] and go to the [facility] building, (0.7 miles from [place]) WEDNESDAYS BREAKFAST 7:00am - 8:00am served at [facility], [facility] [address], (1.2 miles from [place]) LUNCH ONE LOCATION [plus two additional locations listed below] 10:30am - 12:30pm served at [facility], [facility] [address], (1.2 miles from [place]) (FIRST Wednesday ONLY) 11:30am served at [facility], [address] (6.6 miles from [place]) (SECOND Wednesday ONLY) 11:00am served at [facility], [address] (1.3 miles from [place]) DINNER TWO LOCATIONS 6:00pm served at [facility], [address] (1.3 miles from [place]) 4:00pm - 6:00pm (hot dogs and chips) served at [facility], [address] (1.7 miles from [place]) THURSDAYS BREAKFAST NOT AVAILABLE AT THIS TIME LUNCH 10:30am - 12:30pm served at [facility], [facility], [address], (1.2 miles from [place]) DINNER 6:00pm served at [facility], enter from [address] and go to the [facility] building, (0.7 miles from [place]) FRIDAYS BREAKFAST NOT AVAILABLE AT THIS TIME LUNCH 10:30am - 12:30pm served at [facility], [facility] [address], (1.2 miles from [place]) DINNER TWO LOCATIONS, [plus one additional first Friday only] 6:00pm served under the bridge at [address] by [facility]. (.7 miles from [place]) 5:00pm - 7:00pm served at [facility], [address] (1.7 miles from [place]) (FIRST Friday ONLY) 5:45 pm - SHUTTLE provided from the LIBRARY at 5:45pm. Served at [facility], [address] SATURDAYS BREAKFAST TWO LOCATIONS [plus one additional last Saturday only] 8:00am served at [place] by [employer] 8:30am served at [facility], [address] (2.2 miles from [place]) (LAST Saturday ONLY) 8:30am   served at [facility], [address] (5 miles from [place]) LUNCH 10:30am - 12:30pm served at [facility], [facility] [address] (1.2 miles from [place]) DINNER 6:00pm served under the bridge at [address] by [facility] (0.7 miles from [place])  DIRECTIONS FROM [place] TO ALL MEAL LOCATIONS The Bridge at [address] (.7 miles from [place]) Head South on [address] Turn Right onto [address] 433 ft. Continue onto [address] under bridge, about 500 ft. Courthouse (3 blocks from [place]) South on [address] Turn right on [address] 1 block to [address] [facility] (.5 miles from [place]) Head North on [address] past [address] to [address] Enter from [address] and go to the [facility] building [facility] [address] (1.3 miles from [place]) Head South on [address] Turn Right onto [address] church will be on the Left. [facility] [address] (.3 miles from [place]) Go .3 miles on [address] Destination is on your right [facility] at [address] (6.6 miles from [place]) Head south on [address] toward [address] Turn right onto [address] Continue onto [address]. Continue onto [address] 5. Church is on right [facility] ([facility]) [address] (.6 miles from [place]) Head South on [address] Turn Left onto [address] 0.3 miles Destination is on the Left. [facility] at [address] (5 miles from [place]) 1. Head south on [address] Turn right onto [address] Turn slightly left onto [address] Continue onto [address] Turn right at [address] Continue to church on right [facility] [address] (1.7 miles from [place]) Head South on [address] 1.4 miles Elm becomes [address] Continue 0.6 miles and church will be on theright. [facility] at [address] (2.5 miles from [place]) [SHUTTLE provided from [place]] Head north on [address] toward [place] Turn right onto [address] Turn left onto [address] Turn left onto [place] [facility] [address] (2.2 miles from [place]) Head South on [address] 1.4 miles Elm becomes [address] Turn right onto [address] and church will be on the Left. [facility] [address] (1.2 miles from [place]) 1.Turn right onto [address] 2.Turn left onto [address].Cross [address] 4.Destination is on your right [facility] at [address] (1.3 miles from [place]) Head south on [address] Turn left onto [address] Turn right onto [address] Continue onto [address] Turn left onto [address] Turn right onto [address]  [place] assistance programs. If you are behind on your bills and expenses, and need some help to make it through a short term hardship or financial emergency, there are several organizations and charities in the [place] and [place] area that may be able to help. They range from the [employer] [employer], [facility], [employer] [facility] [place] and the local community action agency,   the [employer] These groups may be able to provide you resources to help pay your utility bills, rent, and they even offer housing assistance.  Crisis assistance program Find help for paying your rent, electric bills, free food, and even funds to pay your mortgage. The [facility] (336-271-5959) offers several services to local families, as funding allows. The Emergency Assistance Program (EAP), which they administer, provides household goods, free food, clothing, and financial aid to people in need in the [place] area. The EAP program does have some qualification, and counselors will interview clients for financial assistance by written referral only. Referrals need to be made by the Department of Social Services or by other EAP approved human services agencies or charities in the area.  Money for resources for emergency assistance are available for security deposits for rent, water, electric, and gas, past due rent, utility bills, past due mortgage payments, food, and clothing. The [facility] also operates a food pantry on the site. More [facility].  [facility] [place], which can be reached at (336) 885-0191, offers emergency assistance programs for those in need of help, such as food, rent assistance, a soup kitchen, shelter, and clothing. They are based in [place] but provide a number of services to those that qualify for assistance. Continue with [facility] programs.  [place] Department of Social Services may be able to offer temporary financial assistance and cash grants for paying rent and utilities. Help may be provided for local county residents who may be experiencing personal crisis when other resources, including government programs, are not available. Call (336) 845-7756  [employer], which is based in [place], provides financial assistance of up to $50.00 to  help pay for rent, utilities, cooling bills, rent, and prescription medications. The program also provides secondhand furniture to those in need. ([phone]) [phone]  [place] [facility] [facility] is a United Way funded non profit agency. The organization can offer emergency assistance for paying rent, electric bills, utilities, food, household products and furniture. They offer extensive emergency and transitional housing for families, children and single women, and also run a Boy's and Girl's Club. [address] Thrift [employer]ops, Clothing Banks, and other aid offered too. [address], [place], [place] [address], ([phone]) [phone]  Additional locations of the [employer] are in [place] and other nearby communities. When you have an emergency, need free food, money for basic needs, or just need assistance around Christmas, then the [employer] may have the resources you need. Or they can refer you to nearby agencies. Learn more.  [place] Low Income Energy Assistance Program - This is offered for [place] families. The federal government created Low Income Energy Assistance Program provides a one-time cash grant payment to help eligible low-income families pay their electric and heating bills. [address], [place], [place] [address], (336) 641-3000  Government and Cash Assistance - The county administers several emergency and self-sufficiency programs. Residents of [place] can get help with energy bills and food, rent, and other expenses. In addition, work with a case manager who may be able to help you find a job or improve your employment skills. More [place] public assistance.  [place] Emergency Assistance - A program offers emergency utility and rent funds for greater [place] area residents. The program can also provide counseling and referrals to charities and government programs. Also provides food and a   free meal program that serves lunch Mondays - Saturdays and dinner seven  days per week to individuals in the community. [address], [place], [place] [address], ([phone]) 885-0191  [facility] [employer] - Offers affordable apartment and housing communities across [place] and [place]. The low income and seniors can access public housing, rental assistance to qualified applicants, and apply for the section 8 rent subsidy program. Other programs include Family Self-Sufficiency and Neighborhood Resource Centers. [address], [place], [place] [address], dial (336) 275-8501.  Basic needs such as clothing - Low income families can receive free items (school supplies, clothes, holiday assistance, etc.) from clothing closets while more moderate income [place] families can shop at regional thrift stores. Locations across the area help the needy. Get information on [facility] free clothing centers.  The [facility] provides transitional housing to veterans and the disabled. Clients will also access other services too, including life skills classes, case management, and assistance in finding permanent housing. [address], [place], [place] [address], call ([phone]) [phone]  [facility] in [place] is for people who were just evicted or that are formerly homeless. The non-profit will also help then gain self-sufficiency, find a home or apartment to live in, and also provides information on rent assistance when needed. Dial ([phone]) 286-6401  [employer] to End Homelessness is available in [place]. Families that were evicted or that are homeless can gain shelter, food, clothing, furniture, and also emergency financial assistance. Other services include financial skills and life skills coaching, job training, and case management. [address], [place], [place] [address]. Telephone (336) 275-8585.  [facility] [place] [employer] runs the Weatherization  Program. This can help people save money on their heating and summer cooling bills, and is free to low income families. Free upgrades can be made to your home. Phone ([phone]) 229-7031  Many of the non-profits and programs mentioned above are all inclusive, meaning they can meet many needs of the low income, such as energy bills, food, rent, and more. However there are several organizations that focus just on rent and housing. Read more on rent assistance in [place] region.  Legal assistance for evictions, foreclosure, and more If you need free legal advice on civili issues, such as foreclosures, evictions, utility bill disconnection, government programs, domestic issues and more, [facility] [place] ([facility]) is a nonprofit law firm that provides free legal services and counsel to lower income people, seniors, disabled, and others. The goal is to ensure everyone has access to justice and fair representation.  Call them at [phone], or click here to learn more about  free legal assistance programs.  [place] grants and funds for emergency expenses [employer] is another organization that can provide people with cash grants and funds to pay bills. Their assistance depends on funding, and the demand for help is always very high. They can provide cash to help pay rent, a missed mortgage payment, or gas, electric, and water bills. But the assistance doesn't stop there. They also have a food pantry on site, which can provide food once every three (3) months to people who need help. The [place] [employer] can also offer a clothing voucher once every three (3) months for a maximum three (3) times. After receiving this voucher over that period of time, applicants can receive this aid one every six (6) months after that. ([phone]) [phone].   Community action agency The   [employer] offers job and employment training. Resources are focused on  helping students obtain the skills and experiences that are necessary to compete in today's challenging and tight job market. The non-profit faith-based community action agency offers internship trainings as well as classroom instruction. Economically disadvantaged and challenged individuals and potential employers can use their services. Classes are tailored to meet the needs of people in the [place] region. [place], [place] [address], ([phone]) [phone]    Foreclosure prevention services Housing Counseling and Education is also offered by [facility]. The agency (phone number is below) is a federal government HUD certified housing counseling agency providing foreclosure advice and counseling. They offer mortgage resolution counseling and also reverse mortgage counseling. Counselors can direct people to both federal government mortgage programs, as well as [place] foreclosure assistance options.  Consumer Credit Counseling Service has locations in [place] and [place]. They run debt and foreclosure prevention programs for local families. A sampling of the programs offered include both Budget and Housing Counseling. This includes money management, financial advice, budget review and development of a written action plan with a nationally certified non-profit consumer credit counselor to help solve specific individual financial problems. In addition, housing and mortgage counselors can also provide pre- and post-purchase homeownership counseling, default resolution counseling (to prevent foreclosure) and reverse mortgage counseling. A Debt Management Program allows people and families with a high level of credit card or medical debt to consolidate and repay consumer debt and loans to creditors and rebuild positive credit ratings and scores. [phone]  Debt assistance programs Receive free counseling and debt help from [facility]. The [place] based  agency can be reached at (336) 387-6161. The counselors provide free help, and the services include budget counseling. This will help people manage their expenses and set goals. They also offer a Debt Solver Program, which will help individuals consolidate their debts and become debt free. Most of the workshops and services are free.  Community clinics in [place] Five of the leading health and dental centers are listed below. They may be able to provide medication, physicals, dental care, and general family care to residents of all incomes and backgrounds across the region. Some of the programs focus on the low income and underinsured. However if these clinics can't meet your needs, find information and details on more clinics in [place].  Some of the options include [facility] [place]. This center provides free or low cost health care to low-income adults 18 - 64, who have no health insurance. Among other services offered include a pharmacy and eye clinic. Phone ([phone]) [phone]  [facility], which is located in [place], is a community clinic that provides primary medical and health care to uninsured and underinsured adults and families, as well as the low income, in the greater [place] area on a sliding-fee scale. Call (336) [phone]  [facility] - They run a dental assistance program that is organized by [facility] to provide dental services and aid to [place] residents. Services offered by the dental clinic are limited to extractions, pain management, and minor restorative care. [phone]  [facility] has locations in [place] and [place]. The community clinics provide complete pediatric care including primary health, mental health, social work, neurology, cardiology, asthma. Dial ([phone]) [phone].  In addition to those [place] and [place] community clinics, find other    free community clinics in [place] and across the county.  Food pantry and assistance Some of the local food pantries and distribution centers to call for free food and groceries include [facility] [place] [facility] [facility] (phone (336) 617-5328), [facility] [employer] [facility] (phone (336) 275-8585) and also [employer]. Dial (336) 988-8899.  Several other food banks in the region provide clothing, free food and meals, access to soup kitchens and other help. Find the addresses and phone numbers of more food pantries in [place]. http://www.needhelppayingbills.com/html/[place]assistance_pro.html   

## 2022-04-30 NOTE — Progress Notes (Signed)
Progress Note  Patient Name: [name] Date of Encounter: 04/30/2022  Primary Cardiologist: Donato Schultz, MD   Subjective   Patient seen and examined at her bedside.  She offers no complaints at this time.  Inpatient Medications    Scheduled Meds:  amiodarone  200 mg Oral Daily   apixaban  5 mg Oral BID   gabapentin  300 mg Oral QHS   insulin aspart  0-15 Units Subcutaneous TID WC   insulin aspart  0-5 Units Subcutaneous QHS   isosorbide mononitrate  30 mg Oral Daily   rosuvastatin  20 mg Oral q1800   sacubitril-valsartan  1 tablet Oral BID   Continuous Infusions:  cefTRIAXone (ROCEPHIN)  IV 1 g (04/29/22 2101)   PRN Meds: acetaminophen **OR** acetaminophen, albuterol, senna-docusate   Vital Signs    Vitals:   04/29/22 1744 04/29/22 2051 04/30/22 0503 04/30/22 0754  BP: (!) 121/54 (!) 119/52 (!) 139/59 (!) 129/53  Pulse: (!) 55 (!) 53 (!) 51 (!) 56  Resp:  Temp: 98.5 F (36.9 C) 98.2 F (36.8 C) 98.4 F (36.9 C) 98.4 F (36.9 C)  TempSrc: Oral Oral Oral Oral  SpO2: 98% 100% 99% 96%  Weight:      Height:        Intake/Output Summary (Last 24 hours) at 04/30/2022 1023 Last data filed at 04/30/2022 0800 Gross per 24 hour  Intake 360 ml  Output --  Net 360 ml   Filed Weights   04/27/22 1455  Weight: 106.1 kg    Telemetry    - Personally Reviewed  ECG    Sinus bradycardia with first-degree heart block QTc 507- Personally Reviewed  Physical Exam    General: Comfortable,  Head: Atraumatic, normal size  Eyes: PEERLA, EOMI  Neck: Supple, normal JVD Cardiac: Normal S1, S2; RRR; no murmurs, rubs, or gallops Lungs: Clear to auscultation bilaterally Abd: Soft, nontender, no hepatomegaly  Ext: warm, no edema Musculoskeletal: No deformities, BUE and BLE strength normal and equal Skin: Warm and dry, no rashes   Neuro: Alert and oriented to person, place, time, and situation, CNII-XII grossly intact, no focal deficits  Psych: Normal mood  and affect   Labs    Chemistry Recent Labs  Lab 04/27/22 1511 04/28/22 0102 04/28/22 0538 04/29/22 0350 04/30/22 0154  NA 140  --  137 135 137  K 5.3*  --  3.3* 4.6 4.7  CL 109  --  103 100 104  CO2 24  --  GLUCOSE 88  --  156* 151* 161*  BUN 24*  --  CREATININE 1.42*   < > 1.33* 1.23* 1.43*  CALCIUM 8.3*  --  8.8* 9.5 9.5  PROT 6.5  --  6.9  --   --   ALBUMIN 2.9*  --  3.1*  --   --   AST 57*  --  25  --   --   ALT 23  --  21  --   --   ALKPHOS 70  --  76  --   --   BILITOT 2.3*  --  0.6  --   --   GFRNONAA 39*   < > 43* 47* 39*  ANIONGAP 7  --  < > = values in this interval not displayed.     Hematology Recent Labs  Lab 04/28/22 0538 04/29/22 0350 04/30/22 0154  WBC 5.4 4.9  5.4  RBC 4.20 4.42 4.18  HGB 12.7 13.0 12.6  HCT 39.0 41.1 38.3  MCV 92.9 93.0 91.6  MCH 30.2 29.4 30.1  MCHC 32.6 31.6 32.9  RDW 15.0 14.8 14.9  PLT 119* 119* 123*    Cardiac EnzymesNo results for input(s): "TROPONINI" in the last 168 hours. No results for input(s): "TROPIPOC" in the last 168 hours.   BNP Recent Labs  Lab 04/27/22 1511  BNP 81.7     DDimer No results for input(s): "DDIMER" in the last 168 hours.   Radiology    No results found.  Cardiac Studies   None this admission- reviewed 01/13/2022  Patient Profile     72 y.o. female with past medical history of coronary artery disease status post CABG 2007, ischemic or myopathy, chronic systolic heart failure, diabetes mellitus with DKA, DVT, hypertension, hyperlipidemia, left bundle branch block, obesity OSA V. tach status post ICD implantation.  Assessment & Plan    Prolonged QTc Recent VF arrest Status post ICD with intermittent left bundle branch block Coronary artery disease status post CABG Heart failure with reduced ejection fraction History of DVT  He was restarted on amiodarone yesterday QTc has slightly prolonged ms, we will keep her on the dose of 200 for now and  monitor her closely she is bradycardic though.  QTc worsens tomorrow we will cut back on her Amio 200 mg daily. Continue the Eliquis No angina symptoms continue the patient on her statin.  She is not on aspirin given her Eliquis. Blood pressure has recovered from the hypotension but kidney function is still worsened. Follow Entresto if kidney function improves will restart this. Cannot restart her Coreg given her significant bradycardic at this time on the Amio.      For questions or updates, please contact [facility] Please consult [email or web address] for contact info under Cardiology/STEMI.      Signed, Thomasene Ripple, DO  04/30/2022, 10:23 AM

## 2022-04-30 NOTE — Progress Notes (Signed)
   04/30/22 1225  Mobility  Activity Ambulated with assistance in hallway  Level of Assistance Minimal assist, patient does 75% or more  Assistive Device Front wheel walker  Distance Ambulated (ft) 250 ft  Activity Response Tolerated well  Mobility Referral Yes  $Mobility charge 1 Mobility   Mobility Specialist Progress Note  Pt was in bed and agreeable. Had no c/o pain. Returned to bed w/ all needs met and call bell in reach.  Anastasia Pall Mobility Specialist  Please contact via SecureChat or Rehab office at [phone]

## 2022-05-01 ENCOUNTER — Other Ambulatory Visit (HOSPITAL_COMMUNITY): Payer: Self-pay

## 2022-05-01 DIAGNOSIS — N3000 Acute cystitis without hematuria: Secondary | ICD-10-CM | POA: Diagnosis not present

## 2022-05-01 LAB — CBC
HCT: 40.1 % (ref 36.0–46.0)
Hemoglobin: 12.6 g/dL (ref 12.0–15.0)
MCH: 29.2 pg (ref 26.0–34.0)
MCHC: 31.4 g/dL (ref 30.0–36.0)
MCV: 93 fL (ref 80.0–100.0)
Platelets: 121 10*3/uL — ABNORMAL LOW (ref 150–400)
RBC: 4.31 MIL/uL (ref 3.87–5.11)
RDW: 14.7 % (ref 11.5–15.5)
WBC: 5.2 10*3/uL (ref 4.0–10.5)
nRBC: 0 % (ref 0.0–0.2)

## 2022-05-01 LAB — BASIC METABOLIC PANEL
Anion gap: 10 (ref 5–15)
BUN: 24 mg/dL — ABNORMAL HIGH (ref 8–23)
CO2: 25 mmol/L (ref 22–32)
Calcium: 9.9 mg/dL (ref 8.9–10.3)
Chloride: 99 mmol/L (ref 98–111)
Creatinine, Ser: 1.19 mg/dL — ABNORMAL HIGH (ref 0.44–1.00)
GFR, Estimated: 49 mL/min — ABNORMAL LOW (ref >60)
Glucose, Bld: 220 mg/dL — ABNORMAL HIGH (ref 70–99)
Potassium: 4.8 mmol/L (ref 3.5–5.1)
Sodium: 134 mmol/L — ABNORMAL LOW (ref 135–145)

## 2022-05-01 LAB — GLUCOSE, CAPILLARY
Glucose-Capillary: 238 mg/dL — ABNORMAL HIGH (ref 70–99)
Glucose-Capillary: 224 mg/dL — ABNORMAL HIGH (ref 70–99)
Glucose-Capillary: 195 mg/dL — ABNORMAL HIGH (ref 70–99)

## 2022-05-01 MED ORDER — TRESIBA FLEXTOUCH 100 UNIT/ML ~~LOC~~ SOPN: 5.0000 [IU] | PEN_INJECTOR | Freq: Every day | SUBCUTANEOUS | 0 refills | Status: DC

## 2022-05-01 MED ORDER — CEFADROXIL 500 MG PO CAPS
500.0000 mg | ORAL_CAPSULE | Freq: Two times a day (BID) | ORAL | 0 refills | Status: AC
  Filled 2022-05-01: qty 4, 2d supply, fill #0

## 2022-05-01 MED ORDER — AMIODARONE HCL 200 MG PO TABS
100.0000 mg | ORAL_TABLET | Freq: Every day | ORAL | 0 refills | Status: DC
  Filled 2022-05-01 (×2): qty 15, 30d supply, fill #0

## 2022-05-01 NOTE — Plan of Care (Signed)
  Problem: Nutritional: Goal: Maintenance of adequate nutrition will improve Outcome: Progressing Goal: Progress toward achieving an optimal weight will improve Outcome: Progressing   Problem: Education: Goal: Knowledge of General Education information will improve Description: Including pain rating scale, medication(s)/side effects and non-pharmacologic comfort measures Outcome: Progressing   Problem: Health Behavior/Discharge Planning: Goal: Ability to manage health-related needs will improve Outcome: Progressing   Problem: Nutrition: Goal: Adequate nutrition will be maintained Outcome: Progressing   Problem: Elimination: Goal: Will not experience complications related to bowel motility Outcome: Progressing Goal: Will not experience complications related to urinary retention Outcome: Progressing   Problem: Safety: Goal: Ability to remain free from injury will improve Outcome: Progressing   Problem: Skin Integrity: Goal: Risk for impaired skin integrity will decrease Outcome: Progressing

## 2022-05-01 NOTE — Inpatient Diabetes Management (Signed)
Inpatient Diabetes Program Recommendations  AACE/ADA: New Consensus Statement on Inpatient Glycemic Control (2015)  Target Ranges:  Prepandial:   less than 140 mg/dL      Peak postprandial:   less than 180 mg/dL (1-2 hours)      Critically ill patients:  140 - 180 mg/dL   Lab Results  Component Value Date   GLUCAP 224 (H) 05/01/2022   HGBA1C 7.7 (H) 10/29/2021    Review of Glycemic Control  Latest Reference Range & Units 04/30/22 07:55 04/30/22 12:21 04/30/22 17:22 04/30/22 19:41 04/30/22 22:46 05/01/22 08:23  Glucose-Capillary 70 - 99 mg/dL 253 (H) 664 (H) 403 (H) 260 (H) 238 (H) 224 (H)   Diabetes history: DM 2 Outpatient Diabetes medications: Tresiba 30 units qhs, Novolog 10-15 units tid, Farxiga 10 mg Daily, Dexcom G7 Current orders for Inpatient glycemic control:  Novolog 0-15 units tid + hs  Inpatient Diabetes Program Recommendations:    -  Add Novolog 3 units tid meal coverage if eating >50% of meals.  Thanks,  Christena Deem RN, MSN, BC-ADM Inpatient Diabetes Coordinator Team Pager [phone] (8a-5p)

## 2022-05-01 NOTE — Progress Notes (Signed)
Progress Note  Patient Name: [name] Date of Encounter: 05/01/2022  Primary Cardiologist: Donato Schultz, MD   Subjective   Patient seen and examined at her bedside.  She offers no complaints at this time.  Inpatient Medications    Scheduled Meds:  amiodarone  200 mg Oral Daily   apixaban  5 mg Oral BID   gabapentin  300 mg Oral QHS   insulin aspart  0-15 Units Subcutaneous TID WC   insulin aspart  0-5 Units Subcutaneous QHS   isosorbide mononitrate  30 mg Oral Daily   rosuvastatin  20 mg Oral q1800   sacubitril-valsartan  1 tablet Oral BID   Continuous Infusions:  cefTRIAXone (ROCEPHIN)  IV 1 g (04/30/22 1855)   PRN Meds: acetaminophen **OR** acetaminophen, albuterol, senna-docusate   Vital Signs    Vitals:   04/30/22 1545 04/30/22 1938 05/01/22 0508 05/01/22 0822  BP: 119/60 138/61 (!) 136/59 123/62  Pulse: (!) 56 (!) 53 (!) 54 (!) 57  Resp: Temp: 98.4 F (36.9 C) 98.3 F (36.8 C) 98 F (36.7 C) 98.3 F (36.8 C)  TempSrc: Oral Oral Oral Oral  SpO2: 94% 100% 98% 98%  Weight:      Height:        Intake/Output Summary (Last 24 hours) at 05/01/2022 1048 Last data filed at 04/30/2022 2200 Gross per 24 hour  Intake 600 ml  Output 1300 ml  Net -700 ml   Filed Weights   04/27/22 1455  Weight: 106.1 kg    Telemetry    - Personally Reviewed  ECG    Sinus bradycardia with first-degree heart block QTc 507- Personally Reviewed  Physical Exam    General: Comfortable,  Head: Atraumatic, normal size  Eyes: PEERLA, EOMI  Neck: Supple, normal JVD Cardiac: Normal S1, S2; RRR; no murmurs, rubs, or gallops Lungs: Clear to auscultation bilaterally Abd: Soft, nontender, no hepatomegaly  Ext: warm, no edema Musculoskeletal: No deformities, BUE and BLE strength normal and equal Skin: Warm and dry, no rashes   Neuro: Alert and oriented to person, place, time, and situation, CNII-XII grossly intact, no focal deficits  Psych: Normal mood and  affect   Labs    Chemistry Recent Labs  Lab 04/27/22 1511 04/28/22 0102 04/28/22 0538 04/29/22 0350 04/30/22 0154 05/01/22 0033  NA 140  --  137 135 137 134*  K 5.3*  --  3.3* 4.6 4.7 4.8  CL 109  --  103 100 104 99  CO2 24  --  GLUCOSE 88  --  156* 151* 161* 220*  BUN 24*  --  24*  CREATININE 1.42*   < > 1.33* 1.23* 1.43* 1.19*  CALCIUM 8.3*  --  8.8* 9.5 9.5 9.9  PROT 6.5  --  6.9  --   --   --   ALBUMIN 2.9*  --  3.1*  --   --   --   AST 57*  --  25  --   --   --   ALT 23  --  21  --   --   --   ALKPHOS 70  --  76  --   --   --   BILITOT 2.3*  --  0.6  --   --   --   GFRNONAA 39*   < > 43* 47* 39* 49*  ANIONGAP 7  --  < > =  values in this interval not displayed.     Hematology Recent Labs  Lab 04/29/22 0350 04/30/22 0154 05/01/22 0033  WBC 4.9 5.4 5.2  RBC 4.42 4.18 4.31  HGB 13.0 12.6 12.6  HCT 41.1 38.3 40.1  MCV 93.0 91.6 93.0  MCH 29.4 30.1 29.2  MCHC 31.6 32.9 31.4  RDW 14.8 14.9 14.7  PLT 119* 123* 121*    Cardiac EnzymesNo results for input(s): "TROPONINI" in the last 168 hours. No results for input(s): "TROPIPOC" in the last 168 hours.   BNP Recent Labs  Lab 04/27/22 1511  BNP 81.7     DDimer No results for input(s): "DDIMER" in the last 168 hours.   Radiology    No results found.  Cardiac Studies   None this admission- reviewed 01/13/2022  Patient Profile     72 y.o. female with past medical history of coronary artery disease status post CABG 2007, ischemic or myopathy, chronic systolic heart failure, diabetes mellitus with DKA, DVT, hypertension, hyperlipidemia, left bundle branch block, obesity OSA V. tach status post ICD implantation.  Assessment & Plan    Prolonged QTc Recent VF arrest Status post ICD with intermittent left bundle branch block Coronary artery disease status post CABG Heart failure with reduced ejection fraction History of DVT  H still bradycardic would suggest cutting  back her amiodarone 100 mg daily on discharge . No angina symptoms continue the patient on her statin.  She is not on aspirin given her Eliquis. Blood pressure has recovered from the hypotension but kidney function is still worsened. Follow Entresto if kidney function improves will restart this. Cannot restart her Coreg given her significant bradycardic at this time on the Amio.     [facility] will sign off.   Medication Recommendations: Amiodarone 100 mg daily, apixaban 5 mg twice daily, isosorbide mononitrate 300 mg daily, Entresto 24-26 mg twice daily. Other recommendations (labs, testing, etc): None Follow up as an outpatient: Routine follow-up with cardiology      Signed, Jamie Ripple, DO  05/01/2022, 10:48 AM

## 2022-05-01 NOTE — Progress Notes (Signed)
   05/01/22 1112  Mobility  Activity Ambulated with assistance in hallway  Level of Assistance Minimal assist, patient does 75% or more  Assistive Device Front wheel walker  Distance Ambulated (ft) 150 ft  Activity Response Tolerated well  Mobility Referral Yes  $Mobility charge 1 Mobility   Mobility Specialist Progress Note  Pt was in bed and agreeable. Had no c/o pain. Returned to bed w/ all needs met and call bell in reach .   Anastasia Pall Mobility Specialist  Please contact via SecureChat or Rehab office at [phone]

## 2022-05-01 NOTE — Discharge Summary (Signed)
Physician Discharge Summary  [name] ZOX:[id number] DOB: May 22, 1950 DOA: 04/27/2022  PCP: Marva Panda, NP  Admit date: 04/27/2022 Discharge date: 05/01/2022 Recommendations for Outpatient Follow-up:  Follow up with PCP in 1 weeks-call for appointment Follow-up with cardiology in 1 week Follow-up with her GYN at [facility] for adnexal mass Please obtain BMP/CBC in one week  Discharge Dispo: home Discharge Condition: Stable Code Status:   Code Status: Full Code Diet recommendation:  Diet Order             Diet heart healthy/carb modified Room service appropriate? Yes; Fluid consistency: Thin  Diet effective now                   Brief/Interim Summary: 72 yof w/ HFpEF 25-30%, s/p ICD,atrial fibrillation, CAD, chronic respiratory failure 3 L, asthma, HLD, HTN, prolonged QTc, and right adnexal cyst who for the past 3-4 days has been feeling occasionally lightheaded and off- and her SBP per her son was in the high 90s-100. She drank some water, her BP improved slightly.  Her dizziness resolved. On 4/19 she had a PCP appointment, she took her AM medications prior to going. While waiting became dazed and confused,was poorly interactive.He alerted the nursing staff.  Her blood pressure check was 78/40 and 83/41 so was transported to the ER. The patient is maintained on Lasix for her HFrEF. She had been instructed by PCP to take an extra dose of Lasix for 3 days for mild fluid overload.  The patient took the twice daily dose for over a week before returning to her normal dose of Lasix 40 mg daily. Patient denies fever, chills, vomiting, shortness of breath or wheeze.  She endorses some nausea, spitting up, BOU, chronic dry cough, lightheadedness, dizziness, altered mentation a doctor's office. EMS gave 850 cc NS and additional 500 cc in the ED, BP had normalized, workup showed  UTI,K: 4.3, creatinine 1.42 up from 0.9 [01/17/2022.Lokelma given by EDP. 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  She was given IV antibiotics for cystitis, her AKI has nicely improved at this time tolerating Entresto holding diuretics she is back in amiodarone.  Did well with PT OT.  Once cleared by cardiology she will be discharged home    Discharge Diagnoses:  Principal Problem:   Acute cystitis Active Problems:   Chronic combined systolic and diastolic heart failure   Acute kidney injury superimposed on chronic kidney disease   CAD (coronary artery disease)   Hypertension   Hypotension   Hyperlipidemia   ICD (implantable cardioverter-defibrillator) in place   Diabetes mellitus   History of DVT (deep vein thrombosis)   QT prolongation   Chronic hypoxemic respiratory failure   Anxiety and depression   Depressed left ventricular ejection fraction   Acute cystitis: CT renal study with nonobstructive nephrolithiasis, cholelithiasis manage empiric antibiotics including with organism.  At this time afebrile no leukocytosis > will discharge on 2 more days of oral antibiotics  AKI on CKD stage II:  creat slightly up 1.4 b/l 0.9-1.1. on entresto- adjust meds per cardio- encourage iral hydratin. Hold diuretics Recent Labs    01/15/22 0034 01/16/22 0115 01/17/22 0027 01/26/22 1458 01/31/22 1501  04/27/22 1511 04/28/22 0102 04/28/22 0538 04/29/22 0350 04/30/22 0154 05/01/22 0033  BUN 20 19 18 15 23  24*  --  21 18 19  24*  CREATININE 1.04* 0.88 0.99 1.07* 1.15* 1.42* 1.31* 1.33* 1.23* 1.43* 1.19*     Hyperkalemia: Resolved Hypokalemia resolved  Diabetes mellitus with  hypoglycemia:resolved. Holding long acting insulin, keep on SSI. On continuous glucose monitoring Recent Labs  Lab 04/30/22 1722 04/30/22 1941 04/30/22 2246 05/01/22 0823 05/01/22 1140  GLUCAP 247* 260* 238* 224* 195*     Hypotension/soft blood pressure: BP improved with IV fluid.  Please refer to below for cardiac meds  Wide  QRS Prolonged Qtc Bradycardia AICD: Asymptomatic sinus bradycardia.een by cardiology- back on amiodarone-advised to continue at low dose of 100 g daily due to bradycardia.Appreciate cardiology input.Has ICD in place> interrogated.  CAD s.p CABG-no aspirin due to DOAC continue Imdur and statin.  Chronic combined systolic and diastolic CHF with EF 25 to 30% Hypertension: Currently euvolemic initially needed IV fluids.Continue GDMT per cardiology > with Imdur, Entresto.  Holding Coreg due to bradycardia.   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 We discussed that her EF was 30% and so there would be higher surgical risk. We reviewed that if persistent ORADS3 we would plan to RTC 3 months with TVUS prior to continue addressing risks and benefits of surgical removal. If ORADS4 will need surgery given recent growth. "  Patient will be advised to follow-up with her GYN as outpatient  Morbid Obesity:Body mass index is 44.21 kg/m. :Will benefit with PCP follow-up, weight loss  healthy lifestyle and outpatient sleep evaluation.  Consults: Cardiology  Subjective: Oriented resting comfortably, eager to go home today.  No new complaints  Discharge Exam: Vitals:   05/01/22 0508 05/01/22 0822  BP: (!) 136/59 123/62  Pulse: (!) 54 (!) 57   Resp:  17  Temp: 98 F (36.7 C) 98.3 F (36.8 C)  SpO2: 98% 98%   General: Pt is alert, awake, not in acute distress Cardiovascular: RRR, S1/S2 +, no rubs, no gallops Respiratory: CTA bilaterally, no wheezing, no rhonchi Abdominal: Soft, NT, ND, bowel sounds + Extremities: no edema, no cyanosis  Discharge Instructions  Discharge Instructions     (HEART FAILURE PATIENTS) Call MD:  Anytime you have any of the following symptoms: 1) 3 pound weight gain in 24 hours or 5 pounds in 1 week 2) shortness of breath, with or without a dry hacking cough 3) swelling in the hands, feet or stomach 4) if you have to sleep on extra pillows at night in order to breathe.   Complete by: As directed    Discharge instructions   Complete by: As directed    Please call call MD or return to ER for similar or worsening recurring problem that brought you to hospital or if any fever,nausea/vomiting,abdominal pain, uncontrolled pain, chest pain,  shortness of breath or any other alarming symptoms.  Please follow-up your doctor as instructed in a week time and call the office for appointment.  Your insulin has been adjusted please follow-up with PCP to resume to home dose slowly Check blood sugar 3 times a day and bedtime at home. If blood sugar running above 200  less than 70 please call your MD to adjust insulin. If blood sugars running less 100 do not use insulin and call MD. If you noticed signs and symptoms of hypoglycemia or low blood sugar like jitteriness, confusion, thirst, tremor, sweating- Check blood sugar, drink sugary drink/biscuits/sweets to increase sugar level and call MD or return to ER.   Please avoid alcohol, smoking, or any other illicit substance and maintain healthy habits including taking your regular medications as prescribed.  You were cared for by a hospitalist during your hospital stay. If you have any questions about your discharge medications or the care you received while you were  in the hospital after you are discharged, you can call the unit and ask to speak with the hospitalist on call if the hospitalist that took care of you is not available.  Once you are discharged, your primary care physician will handle any further medical issues. Please note that NO REFILLS for any discharge medications will be authorized once you are discharged, as it is imperative that you return to your primary care physician (or establish a relationship with a primary care physician if you do not have one) for your aftercare needs so that they can reassess your need for medications and monitor your lab values   Increase activity slowly   Complete by: As directed       Allergies as of 05/01/2022       Reactions   Metformin And Related Itching, Swelling, Other (See Comments)   Leg pain & swelling in legs   Ozempic (0.25 Or 0.5 Mg-dose) [semaglutide(0.25 Or 0.5mg -dos)] Other (See Comments)   Pt with hx of pancreatitis    Rosuvastatin    Atorvastatin Calcium Itching, Rash   Levofloxacin Itching        Medication List     STOP taking these medications    carvedilol 12.5 MG tablet Commonly known as: COREG   furosemide 40 MG tablet Commonly known as: LASIX   spironolactone 25 MG tablet Commonly known as: ALDACTONE       TAKE these medications    albuterol (2.5 MG/3ML) 0.083% nebulizer solution Commonly known as: PROVENTIL Take 2.5 mg by nebulization every 6 (six) hours as needed for wheezing or shortness of breath.   amiodarone 200 MG tablet Commonly known as: PACERONE Take 0.5 tablets (100 mg total) by mouth daily. What changed: how much to take   cefadroxil 500 MG capsule Commonly known as: DURICEF Take 1 capsule (500 mg total) by mouth 2 (two) times daily for 2 days.   cyanocobalamin 1000 MCG/ML injection Commonly known as: VITAMIN B12 1,000 mcg every 30 (thirty) days.   dapagliflozin propanediol 10 MG Tabs tablet Commonly known as: Farxiga Take 1 tablet (10  mg total) by mouth daily.   Dexcom G7 Sensor Misc 1 Device by Does not apply route as directed.   Eliquis 5 MG Tabs tablet Generic drug: apixaban TAKE 1 TABLET TWICE DAILY   Entresto 24-26 MG Generic drug: sacubitril-valsartan Take 1 tablet by mouth 2 (two) times daily.   ferrous sulfate 325 (65 FE) MG EC tablet Take 325 mg by mouth every other day.   gabapentin 300 MG capsule Commonly known as: NEURONTIN Take 300 mg by mouth at bedtime.   Insulin Pen Needle 31G X 5 MM Misc 1 Device by Does not apply route in the morning, at noon, in the evening, and at bedtime.   isosorbide mononitrate 30 MG 24 hr tablet Commonly known as: IMDUR Take 30  mg by mouth daily.   latanoprost 0.005 % ophthalmic solution Commonly known as: XALATAN Place 1 drop into both eyes at bedtime.   methocarbamol 500 MG tablet Commonly known as: ROBAXIN Take 500 mg by mouth every 8 (eight) hours as needed for muscle spasms.   nitroGLYCERIN 0.4 MG SL tablet Commonly known as: NITROSTAT Place 1 tablet (0.4 mg total) under the tongue every 5 (five) minutes as needed for chest pain.   NovoLOG FlexPen 100 UNIT/ML FlexPen Generic drug: insulin aspart INJECT PER SLIDING SCALE AS DIRECTED , MAX 30 UNITS PER DAY. DISCARD PEN 28 DAYS AFTER OPENING What changed:  how much to take how to take this when to take this additional instructions   potassium chloride SA 20 MEQ tablet Commonly known as: KLOR-CON M Take 20 mEq by mouth daily as needed (for low pottassium).   rosuvastatin 20 MG tablet Commonly known as: CRESTOR Take 1 tablet (20 mg total) by mouth daily at 6 PM.   Evaristo Bury FlexTouch 100 UNIT/ML FlexTouch Pen Generic drug: insulin degludec Inject 5 Units into the skin daily. What changed: how much to take   True Metrix Blood Glucose Test test strip Generic drug: glucose blood TEST THREE TIMES DAILY   Accu-Chek Guide test strip Generic drug: glucose blood Check 3 times daily   Vitamin D  (Ergocalciferol) 1.25 MG (50000 UNIT) Caps capsule Commonly known as: DRISDOL Take 1 capsule (50,000 Units total) by mouth every 7 (seven) days.        Follow-up Information     Marva Panda, NP Follow up in 1 week(s).   Contact information: [address] [place] [address] [phone]                Allergies  Allergen Reactions   Metformin And Related Itching, Swelling and Other (See Comments)    Leg pain & swelling in legs   Ozempic (0.25 Or 0.5 Mg-Dose) [Semaglutide(0.25 Or 0.5mg -Dos)] Other (See Comments)    Pt with hx of pancreatitis    Rosuvastatin    Atorvastatin Calcium Itching and Rash   Levofloxacin Itching    The results of significant diagnostics from this hospitalization (including imaging, microbiology, ancillary and laboratory) are listed below for reference.    Microbiology: Recent Results (from the past 240 hour(s))  Urine Culture     Status: Abnormal   Collection Time: 04/27/22  3:11 PM   Specimen: Urine, Clean Catch  Result Value Ref Range Status   Specimen Description URINE, CLEAN CATCH  Final   Special Requests   Final    NONE Reflexed from [id number] Performed at [facility], [address], [place], [place] [address]    Culture MULTIPLE SPECIES PRESENT, SUGGEST RECOLLECTION (A)  Final   Report Status 04/29/2022 FINAL  Final    Procedures/Studies: CT Renal Stone Study  Result Date: 04/27/2022 CLINICAL DATA:  Hypotension and dizziness.  Cloudy urine. EXAM: CT ABDOMEN AND PELVIS WITHOUT CONTRAST TECHNIQUE: Multidetector CT imaging of the abdomen and pelvis was performed following the standard protocol without IV contrast. RADIATION DOSE REDUCTION: This exam was performed according to the departmental dose-optimization program which includes automated exposure control, adjustment of the mA and/or kV according to patient size and/or use of iterative reconstruction technique. COMPARISON:  CT abdomen and pelvis 10/30/2021  FINDINGS: Lower chest: Bibasilar atelectasis/scarring. Cardiomegaly. Partially visualized ICD. Hepatobiliary: Cholelithiasis without evidence of cholecystitis. Unremarkable liver. Pancreas: Unremarkable. Spleen: Unremarkable. Adrenals/Urinary Tract: Stable adrenal glands. Nonobstructing stone in the lower pole of  the right kidney measuring 8 mm. No obstructing urinary calculi or hydronephrosis. Unremarkable bladder. Stomach/Bowel: Normal caliber large and small bowel. Normal appendix containing an appendicolith. Stomach is within normal limits. Small hiatal hernia. Vascular/Lymphatic: Extensive arterial vascular calcifications. Iliac artery stents bilaterally. Left common iliac vein stent. No lymphadenopathy. Reproductive: Uterus is unremarkable. Unchanged 5.5 x 5.6 cm right adnexal cyst. Other: No free intraperitoneal fluid or air. Ventral abdominal wall fat containing hernia Musculoskeletal: Chronic compression deformities of L2, L3, L4. Thoracolumbar spondylosis. No acute abnormality. IMPRESSION: 1. Nonobstructing right nephrolithiasis no obstructing urinary calculi or hydronephrosis. 2. Cholelithiasis without evidence of cholecystitis. 3. Unchanged right adnexal cyst measuring up to 5.6 cm. This has slowly increased in size from 11/30/2020 concerning for low-grade cystic neoplasm. 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 Recommend GYN consult, and consider pelvis MRI w/o and w/ contrast if clinically warranted. Note: This recommendation does not apply to premenarchal patients and to those with increased risk (genetic, family history, elevated tumor markers or other high-risk factors) of ovarian cancer. Reference: JACR 2020 Feb; 17(2):248-254 4.  Aortic Atherosclerosis (ICD10-I70.0). Electronically Signed   By: Minerva Fester M.D.   On: 04/27/2022 18:16   DG Chest 2 View  Result Date: 04/27/2022 CLINICAL DATA:  Hypertension and dizziness today.  History of CHF. EXAM: CHEST - 2 VIEW COMPARISON:   Chest radiographs 01/12/2022 and 10/29/2021 FINDINGS: Status post median sternotomy and CABG. Cardiac silhouette is again moderately enlarged. Moderate calcification within the aortic arch. Mildly decreased lung volumes. Bibasilar linear densities are slightly decreased from prior, likely subsegmental atelectasis and/or scarring. No definite pleural effusion. No pneumothorax. Moderate multilevel degenerative disc changes of the thoracic spine. IMPRESSION: 1. Mildly decreased lung volumes and bibasilar subsegmental atelectasis and/or scarring. Overall the lower lung aeration is mildly improved compared to the most recent 01/12/2022 radiograph. 2. Stable cardiomegaly. Electronically Signed   By: Neita Garnet M.D.   On: 04/27/2022 15:48   CUP PACEART REMOTE DEVICE CHECK  Result Date: 04/23/2022 Scheduled remote reviewed. Normal device function.  The device estimates 7.7 months until [employer] Next remote 05/14/2022. Hassell Halim, RN, CCDS, [employer] Scheduled remote reviewed. Normal device function.  The device estimates 7.7 months until ERI Next remote 05/14/2022. Hassell Halim, RN, CCDS, [employer]   Labs: BNP (last 3 results) Recent Labs    08/16/21 1300 01/26/22 1458 04/27/22 1511  BNP 103.5* 86.9 81.7   Basic Metabolic Panel: Recent Labs  Lab 04/27/22 1511 04/28/22 0102 04/28/22 0538 04/29/22 0350 04/30/22 0154 05/01/22 0033  NA 140  --  137 135 137 134*  K 5.3*  --  3.3* 4.6 4.7 4.8  CL 109  --  103 100 104 99  CO2 24  --  GLUCOSE 88  --  156* 151* 161* 220*  BUN 24*  --  24*  CREATININE 1.42* 1.31* 1.33* 1.23* 1.43* 1.19*  CALCIUM 8.3*  --  8.8* 9.5 9.5 9.9  MG  --   --  2.3  --   --   --    Liver Function Tests: Recent Labs  Lab 04/27/22 1511 04/28/22 0538  AST 57* 25  ALT 23 21  ALKPHOS 70 76  BILITOT 2.3* 0.6  PROT 6.5 6.9  ALBUMIN 2.9* 3.1*   No results for input(s): "LIPASE", "AMYLASE" in the last 168 hours. No results for  input(s): "AMMONIA" in the last 168 hours. CBC: Recent Labs  Lab 04/27/22 1511 04/28/22  0102 04/28/22 0538 04/29/22 0350 04/30/22 0154 05/01/22 0033  WBC 4.9 5.6 5.4 4.9 5.4 5.2  NEUTROABS 2.1  --  2.8  --   --   --   HGB 12.6 12.7 12.7 13.0 12.6 12.6  HCT 38.6 39.2 39.0 41.1 38.3 40.1  MCV 93.0 93.1 92.9 93.0 91.6 93.0  PLT 147* 116* 119* 119* 123* 121*   Recent Labs  Lab 04/30/22 1722 04/30/22 1941 04/30/22 2246 05/01/22 0823 05/01/22 1140  GLUCAP 247* 260* 238* 224* 195*  Anemia work up No results for input(s): "VITAMINB12", "FOLATE", "FERRITIN", "TIBC", "IRON", "RETICCTPCT" in the last 72 hours. Urinalysis    Component Value Date/Time   COLORURINE YELLOW 04/29/2022 2105   APPEARANCEUR HAZY (A) 04/29/2022 2105   LABSPEC 1.021 04/29/2022 2105   PHURINE 6.0 04/29/2022 2105   GLUCOSEU >=500 (A) 04/29/2022 2105   HGBUR NEGATIVE 04/29/2022 2105   BILIRUBINUR NEGATIVE 04/29/2022 2105   KETONESUR NEGATIVE 04/29/2022 2105   PROTEINUR NEGATIVE 04/29/2022 2105   UROBILINOGEN 1.0 05/25/2014 2027   NITRITE NEGATIVE 04/29/2022 2105   LEUKOCYTESUR MODERATE (A) 04/29/2022 2105   Sepsis Labs Recent Labs  Lab 04/28/22 0538 04/29/22 0350 04/30/22 0154 05/01/22 0033  WBC 5.4 4.9 5.4 5.2   Microbiology Recent Results (from the past 240 hour(s))  Urine Culture     Status: Abnormal   Collection Time: 04/27/22  3:11 PM   Specimen: Urine, Clean Catch  Result Value Ref Range Status   Specimen Description URINE, CLEAN CATCH  Final   Special Requests   Final    NONE Reflexed from [id number] Performed at [facility], [address], [place], [place] [address]    Culture MULTIPLE SPECIES PRESENT, SUGGEST RECOLLECTION (A)  Final   Report Status 04/29/2022 FINAL  Final   Time coordinating discharge: 35 minutes  SIGNED: Lanae Boast, MD  [facility] 05/01/2022, 12:37 PM  If 7PM-7AM, please contact night-coverage [email or web address]

## 2022-05-01 NOTE — Progress Notes (Addendum)
Patient is awaiting her daughter to transport home. She is not discharge lounge appropriate. She doesn't  know when her daughter will arrive. TOC meds have been delivered. Her discharge instructions have been explained by her RN.  Orvan Seen SWOT RN

## 2022-05-01 NOTE — Plan of Care (Signed)

## 2022-05-07 ENCOUNTER — Encounter: Payer: Self-pay | Admitting: Internal Medicine

## 2022-05-07 ENCOUNTER — Ambulatory Visit (INDEPENDENT_AMBULATORY_CARE_PROVIDER_SITE_OTHER): Payer: Medicare HMO | Admitting: Internal Medicine

## 2022-05-07 VITALS — BP 138/80 | HR 66 | Ht 61.0 in | Wt 234.6 lb

## 2022-05-07 DIAGNOSIS — Z7984 Long term (current) use of oral hypoglycemic drugs: Secondary | ICD-10-CM | POA: Diagnosis not present

## 2022-05-07 DIAGNOSIS — Z794 Long term (current) use of insulin: Secondary | ICD-10-CM | POA: Diagnosis not present

## 2022-05-07 DIAGNOSIS — E1165 Type 2 diabetes mellitus with hyperglycemia: Secondary | ICD-10-CM

## 2022-05-07 LAB — POCT GLYCOSYLATED HEMOGLOBIN (HGB A1C): Hemoglobin A1C: 7.4 % — AB (ref 4.0–5.6)

## 2022-05-07 NOTE — Patient Instructions (Signed)
-   Continue  Tresiba 30 units ONCE  Daily -Continue Farxiga 10 mg, 1 tablet with Breakfast  -Novolog correctional insulin: Use the scale below to help guide you BEFORE you eat   Blood sugar before meal Number of units to inject  Less than 170 0 unit  171 -  210 1 units  211-  250 2 units  251 -  290 3 units  291 -  330 4 units  331 -  370 5 units     HOW TO TREAT LOW BLOOD SUGARS (Blood sugar LESS THAN 70 MG/DL) Please follow the RULE OF 15 for the treatment of hypoglycemia treatment (when your (blood sugars are less than 70 mg/dL)   STEP 1: Take 15 grams of carbohydrates when your blood sugar is low, which includes:  3-4 GLUCOSE TABS  OR 3-4 OZ OF JUICE OR REGULAR SODA OR ONE TUBE OF GLUCOSE GEL    STEP 2: RECHECK blood sugar in 15 MINUTES STEP 3: If your blood sugar is still low at the 15 minute recheck --> then, go back to STEP 1 and treat AGAIN with another 15 grams of carbohydrates.

## 2022-05-07 NOTE — Progress Notes (Signed)
Name: [name]  Age/ Sex: 72 y.o., female   MRN/ DOB: [id number], 01/11/1950     PCP: Marva Panda, NP   Reason for Endocrinology Evaluation: Type 2 Diabetes Mellitus  Initial Endocrine Consultative Visit: 04/20/2019    PATIENT IDENTIFIER: [name] is a 72 y.o. female with a past medical history of DM, HTN, Dyslipidemia , CAD and hx of pancreatitis.. The patient has followed with Endocrinology clinic since 04/20/2019 for consultative assistance with management of her diabetes.     DIABETIC HISTORY:  [name] was diagnosed with T2DM many years ago, she is intolerant to Metformin, has been on Glipizide without reported intolerance. Her hemoglobin A1c has ranged from 7.8% in 2017, peaking at 9.5% in 2014.  On her initial visit to our clinic she had an A1c of 7.7%  , she was on MDI regimen and Ozempic. Marland Kitchen We switched levemir to tresiba and continued Ozempic and Novolog.    farxiga started 11/2019 Ozempic stopped 12/2020 due to pancreatitis on CT imaging.     SUBJECTIVE:   During the last visit (11/03/2021): A1c 7.7%  Today (05/07/2022): [name] is here for a follow up on diabetes. She is accompanied by her son [name].  She checks her blood sugar through the CGM The patient has had  hypoglycemic episodes since the last clinic visit, which typically occur 1 x /month  - most often occuring during the day. The patient is  symptomatic with these episodes.    Patient presented to the ED 04/27/2022 for dizziness and hypotension, found to have UTI She continues to follow-up with gynecology for endometrial thickening and a right ovarian mass She continues to follow-up with cardiology for CHF and CAD  She was evaluated by podiatry on 02/28/2022  Denies abdominal pain , nausea or diarrhea  Abdominal pain , nausea and  vomiting have resolved    HOME DIABETES REGIMEN:  Tresiba  30 units ONCE  Daily  Farxiga 10 mg daily  CF: Novolog (BG-130/20)     Statin:  Yes ACE-I/ARB: No    CONTINUOUS GLUCOSE MONITORING RECORD INTERPRETATION    Dates of Recording: 4/15-4/28/2024  Sensor description:dexcom   Results statistics:   CGM use % of time 79  Average and SD 164/47  Time in range    64    %  % Time Above 180 31  % Time above 250 3  % Time Below target 1   Glycemic patterns summary: Bg's optimal at night and increase during the day   Hyperglycemic episodes  postprandial   Hypoglycemic episodes occurred middle of the day   Overnight periods: stable and optimal    DIABETIC COMPLICATIONS: Microvascular complications:  Neuropathy Denies: CKD, retinopathy  Last Eye Exam: Completed 2019  Macrovascular complications:  CAD, CHF Denies: CVA, PVD   HISTORY:  Past Medical History:  Past Medical History:  Diagnosis Date   Abnormal LFTs    AICD (automatic cardioverter/defibrillator) present    Angina decubitus 11/06/2012   Angina pectoris (HCC)    Anxiety    Arthritis    Back pain    Bundle branch block 05/2016   CAD (coronary artery disease) 06/18/2010   CHF (congestive heart failure) (HCC)    Chronic combined systolic and diastolic CHF, NYHA class 3 (HCC)    Chronic combined systolic and diastolic heart failure (HCC)    Chronic systolic dysfunction of left ventricle 09/24/2012   Coronary artery disease    s/p CABG 2007   Depression  Dermal mycosis    Diabetes mellitus    type II   DKA (diabetic ketoacidoses) 01/12/2019   DVT (deep venous thrombosis) (HCC) 09/2015   bilateral   Edema, lower extremity    Glaucoma    Hyperlipidemia    Hypertension    Hypoglycemia 09/09/2012   Insomnia    Insulin dependent diabetes mellitus (HCC) 04/23/2012   Ischemic cardiomyopathy    EF 30-35%, s/p ICD 4/08 by Dr Amil Amen   Joint pain    LBBB (left bundle branch block) 09/24/2012   Morbid obesity (HCC) 11/06/2012   Obesity    Oxygen dependent 2 L   Pain in left knee    Peripheral neuropathy    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   LIMA to D2,  SVG to ramus intermediate, sequential SVG to OM1-OM2, SVG to RCA with EVH via both legs    Sleep apnea    uses O2 at night   Tinea    Type 2 diabetes mellitus with hyperglycemia, with long-term current use of insulin (HCC) 04/20/2019   Uncontrolled diabetes mellitus 02/2019   Ventricular tachycardia (HCC) 01/16/15   sustained VT terminated with ATP, CL 340 msec   Vitamin B 12 deficiency 11/05/2018   Vitamin D deficiency 11/05/2018   Past Surgical History:  Past Surgical History:  Procedure Laterality Date   BI-VENTRICULAR IMPLANTABLE CARDIOVERTER DEFIBRILLATOR UPGRADE N/A 09/25/2012   Procedure: BI-VENTRICULAR IMPLANTABLE CARDIOVERTER DEFIBRILLATOR UPGRADE;  Surgeon: Gardiner Rhyme, MD;  Location[facility] CATH LAB;  Service: Cardiovascular;  Laterality: N/A;   BREAST BIOPSY Right 04/05/2014   CARDIAC CATHETERIZATION     CARDIAC DEFIBRILLATOR PLACEMENT  4/08   by Dr Amil Amen (MDT)   COLONOSCOPY WITH PROPOFOL N/A 10/12/2016   Procedure: COLONOSCOPY WITH PROPOFOL;  Surgeon: Sherrilyn Rist, MD;  Location: [facility] ENDOSCOPY;  Service: Gastroenterology;  Laterality: N/A;   CORONARY ARTERY BYPASS GRAFT  10/05/2005   by Dr Laneta Simmers   IMPLANTABLE CARDIOVERTER DEFIBRILLATOR IMPLANT  09/25/12   attempt of upgrade to CRT-D unsuccessful due to CS anatomy, SJM Unify Asaura device placed with LV port capped by Dr Johney Frame   IR GENERIC HISTORICAL  10/03/2015   IR US GUIDE VASC ACCESS LEFT 10/03/2015 Malachy Moan, MD [facility]   IR GENERIC HISTORICAL  10/03/2015   IR VENO/EXT/UNI LEFT 10/03/2015 Malachy Moan, MD [facility]   IR GENERIC HISTORICAL  10/03/2015   IR VENOCAVAGRAM IVC 10/03/2015 Malachy Moan, MD [facility]   IR GENERIC HISTORICAL  10/03/2015   IR INFUSION THROMBOL VENOUS INITIAL (MS) 10/03/2015 Malachy Moan, MD [facility]   IR GENERIC HISTORICAL  10/03/2015   IR US GUIDE VASC ACCESS LEFT 10/03/2015 Malachy Moan, MD [facility]   IR GENERIC HISTORICAL  10/04/2015   IR THROMBECT  VENO MECH MOD SED 10/04/2015 Simonne Come, MD [facility]   IR GENERIC HISTORICAL  10/04/2015   IR TRANSCATH PLC STENT 1ST ART NOT LE CV CAR VERT CAR 10/04/2015 Simonne Come, MD [facility]   IR GENERIC HISTORICAL  10/04/2015   IR THROMB F/U EVAL ART/VEN FINAL DAY (MS) 10/04/2015 Simonne Come, MD [facility]   IR GENERIC HISTORICAL  11/02/2015   IR RADIOLOGIST EVAL & MGMT 11/02/2015 Simonne Come, MD [facility]   IR RADIOLOGIST EVAL & MGMT  04/26/2016   LEFT HEART CATH AND CORS/GRAFTS ANGIOGRAPHY N/A 01/19/2019   Procedure: LEFT HEART CATH AND CORS/GRAFTS ANGIOGRAPHY;  Surgeon: Lyn Records, MD;  Location: [facility] LAB;  Service: Cardiovascular;  Laterality: N/A;  LEFT HEART CATH AND CORS/GRAFTS ANGIOGRAPHY N/A 01/15/2022   Procedure: LEFT HEART CATH AND CORS/GRAFTS ANGIOGRAPHY;  Surgeon: Runell Gess, MD;  Location: [facility] INVAS[facility] CV LAB;  Service: Cardiovascular;  Laterality: N/A;   Social History:  reports that she has never smoked. She has never used smokeless tobacco. She reports that she does not drink alcohol and does not use drugs. Family History:  Family History  Problem Relation Age of Onset   Diabetes Mother 40       died - HTN   Stroke Mother    High blood pressure Mother    Sudden death Mother    Obesity Mother    Other Other        No early family hx of CAD     HOME MEDICATIONS: Allergies as of 05/07/2022       Reactions   Metformin And Related Itching, Swelling, Other (See Comments)   Leg pain & swelling in legs   Ozempic (0.25 Or 0.5 Mg-dose) [semaglutide(0.25 Or 0.5mg -dos)] Other (See Comments)   Pt with hx of pancreatitis    Atorvastatin Calcium Itching, Rash   Levofloxacin Itching        Medication List        Accurate as of May 07, 2022  8:51 AM. If you have any questions, ask your nurse or doctor.          albuterol (2.5 MG/3ML) 0.083% nebulizer solution Commonly known as: PROVENTIL Take 2.5 mg by nebulization every 6 (six) hours as  needed for wheezing or shortness of breath.   amiodarone 200 MG tablet Commonly known as: PACERONE Take 0.5 tablets (100 mg total) by mouth daily.   cyanocobalamin 1000 MCG/ML injection Commonly known as: VITAMIN B12 1,000 mcg every 30 (thirty) days.   dapagliflozin propanediol 10 MG Tabs tablet Commonly known as: Farxiga Take 1 tablet (10 mg total) by mouth daily.   Dexcom G7 Sensor Misc 1 Device by Does not apply route as directed.   Eliquis 5 MG Tabs tablet Generic drug: apixaban TAKE 1 TABLET TWICE DAILY   Entresto 24-26 MG Generic drug: sacubitril-valsartan Take 1 tablet by mouth 2 (two) times daily.   ferrous sulfate 325 (65 FE) MG EC tablet Take 325 mg by mouth every other day.   gabapentin 300 MG capsule Commonly known as: NEURONTIN Take 300 mg by mouth at bedtime.   Insulin Pen Needle 31G X 5 MM Misc 1 Device by Does not apply route in the morning, at noon, in the evening, and at bedtime.   isosorbide mononitrate 30 MG 24 hr tablet Commonly known as: IMDUR Take 30 mg by mouth daily.   latanoprost 0.005 % ophthalmic solution Commonly known as: XALATAN Place 1 drop into both eyes at bedtime.   methocarbamol 500 MG tablet Commonly known as: ROBAXIN Take 500 mg by mouth every 8 (eight) hours as needed for muscle spasms.   nitroGLYCERIN 0.4 MG SL tablet Commonly known as: NITROSTAT Place 1 tablet (0.4 mg total) under the tongue every 5 (five) minutes as needed for chest pain.   NovoLOG FlexPen 100 UNIT/ML FlexPen Generic drug: insulin aspart INJECT PER SLIDING SCALE AS DIRECTED , MAX 30 UNITS PER DAY. DISCARD PEN 28 DAYS AFTER OPENING What changed:  how much to take how to take this when to take this additional instructions   potassium chloride SA 20 MEQ tablet Commonly known as: KLOR-CON M Take 20 mEq by mouth daily as needed (for low pottassium).   rosuvastatin 20  MG tablet Commonly known as: CRESTOR Take 1 tablet (20 mg total) by mouth daily at  6 PM.   Evaristo Bury FlexTouch 100 UNIT/ML FlexTouch Pen Generic drug: insulin degludec Inject 5 Units into the skin daily. What changed: how much to take   True Metrix Blood Glucose Test test strip Generic drug: glucose blood TEST THREE TIMES DAILY   Accu-Chek Guide test strip Generic drug: glucose blood Check 3 times daily   Vitamin D (Ergocalciferol) 1.25 MG (50000 UNIT) Caps capsule Commonly known as: DRISDOL Take 1 capsule (50,000 Units total) by mouth every 7 (seven) days.         OBJECTIVE:   Vital Signs: BP 138/80 (BP Location: Left Arm, Patient Position: Sitting, Cuff Size: Large)   Pulse 66   Ht 5\' 1"  (1.549 m)   Wt 234 lb 9.6 oz (106.4 kg)   SpO2 98%   BMI 44.33 kg/m    Wt Readings from Last 3 Encounters:  05/07/22 234 lb 9.6 oz (106.4 kg)  04/27/22 234 lb (106.1 kg)  03/06/22 241 lb (109.3 kg)     Exam: General: Pt appears well and is in NAD  Lungs: Clear with good BS bilat  Heart: RRR  Extremities:  Trace pretibial edema.  Neuro: MS is good with appropriate affect, pt is alert and Ox3     DM Foot Exam: 02/28/2022 per podiatry     DATA REVIEWED:  Lab Results  Component Value Date   HGBA1C 7.4 (A) 05/07/2022   HGBA1C 7.7 (H) 10/29/2021   HGBA1C 8.2 (A) 04/28/2021    Latest Reference Range & Units 05/01/22 00:33  Sodium 135 - 145 mmol/L 134 (L)  Potassium 3.5 - 5.1 mmol/L 4.8  Chloride 98 - 111 mmol/L 99  CO2 22 - 32 mmol/L 25  Glucose 70 - 99 mg/dL 161 (H)  BUN 8 - 23 mg/dL 24 (H)  Creatinine 0.96 - 1.00 mg/dL 0.45 (H)  Calcium 8.9 - 10.3 mg/dL 9.9  Anion gap 5 - 15  10  GFR, Estimated >60 mL/min 49 (L)  (L): Data is abnormally low (H): Data is abnormally high   ASSESSMENT / PLAN / RECOMMENDATIONS:   1) Type 2 Diabetes Mellitus, sub-optimally controlled, With Neuropathic and  macrovascular complications - Most recent A1c of 7.4  %. Goal A1c < 7.0 %.    -A1c trending down -She has been noted with hypoglycemia during the day, will  adjust her sensitivity factor as below - Not a candidate for GLP1 agonist, DPP-4 inhibitors and mounjaro due to pancreatitis.   MEDICATIONS:  -Continue Tresiba 30 units ONCE  Daily  -Continue Farxiga 10 mg, 1 tablet with Breakfast  -Change Correction factor: Novolog ( BG-130/40)  EDUCATION / INSTRUCTIONS: BG monitoring instructions: Patient is instructed to check her blood sugars 3 times a day, before meals Call [facility] if: BG persistently < 70 I reviewed the Rule of 15 for the treatment of hypoglycemia in detail with the patient. Literature supplied.   2) Diabetic complications:  Eye: Does not have known diabetic retinopathy.  Neuro/ Feet: Does  have known diabetic peripheral neuropathy based on foot exam from 07/16/2019 Renal: Patient does not have known baseline CKD. She is not on an ACEI/ARB at present.      F/U in 6 months   Signed electronically by: Lyndle Herrlich, MD  [facility] Endocrinology  [facility] [address] [place] [place], [place] [address] Phone: [phone] FAX: [phone]   CC: Marva Panda,  NP [address] [place] [address] Phone: [phone]  Fax: [phone]  Return to Endocrinology clinic as below: Future Appointments  Date Time Provider Department Center  05/14/2022  7:25 AM [facility] DEVICE REMOTES [facility]  06/05/2022  3:10 PM Swinyer, Zachary George, NP [facility]  06/13/2022  3:45 PM Freddie Breech, DPM [facility]  07/23/2022  9:40 AM [facility] DEVICE REMOTES [facility]  08/08/2022  1:30 PM Lanier Prude, MD [facility]  10/22/2022  9:40 AM [facility] DEVICE REMOTES [facility]  01/21/2023  9:40 AM [facility] DEVICE REMOTES [facility]

## 2022-05-14 ENCOUNTER — Ambulatory Visit: Payer: Medicare HMO | Attending: Cardiology

## 2022-05-14 DIAGNOSIS — I5042 Chronic combined systolic (congestive) and diastolic (congestive) heart failure: Secondary | ICD-10-CM

## 2022-05-14 DIAGNOSIS — Z95810 Presence of automatic (implantable) cardiac defibrillator: Secondary | ICD-10-CM | POA: Diagnosis not present

## 2022-05-15 NOTE — Progress Notes (Signed)
EPIC Encounter for ICM Monitoring  Patient Name: [name] is a 72 y.o. female Date: 05/15/2022 Primary Care Physican: Marva Panda, NP Primary Cardiologist: Skains/Weaver PA  Electrophysiologist: Lalla Brothers 10/04/2021 Weight: 237 lbs 11/06/2021 Weight: 232 lbs 03/05/2022 Weight: 235 lbs 04/16/2022 Weight: 230 lbs 05/15/2022 Weight:  230 lbs   AT/AF Burden: 0% (taking Eliquis)   Battery Longevity: 6.5 months                                                          Spoke with patient and heart failure questions reviewed.  Transmission results reviewed.  Pt asymptomatic for fluid accumulation.  Reports feeling well at this time and voices no complaints.  No problems since 5/1 ED visit.     CorVue thoracic impedance suggesting normal fluid levels since 4/28.      Prescribed: Furosemide 40 mg take 1 tablet daily  Stopped at 4/23 hospital discharge Potassium 20 mEq take 1 tablet daily as needed   Labs: 05/01/2022 Creatinine 1.19, BUN 24, Potassium 4.8, Sodium 134, GFR 49  04/30/2022 Creatinine 1.43, BUN 19, Potassium 4.7, Sodium 137, GFR 39  04/29/2022 Creatinine 1.23, BUN 18, Potassium 4.6, Sodium 135, GFR 47  04/28/2022 Creatinine 1.33, BUN 21, Potassium 3.3, Sodium 137, GFR 43 04/27/2022 Creatinine 1.42, BUN 24, Potassium 5.3, Sodium 140, GFR 39  A complete set of results can be found in Results Review.   Recommendations: No changes and encouraged to call if experiencing any fluid symptoms.   Next ICM clinic phone appointment due: 06/18/2022.     Next 91 day remote transmission due: 07/23/2022.     EP/Cardiology Office Visits:  08/08/2022 with Dr Lalla Brothers.  06/05/2022 with Eligha Bridegroom, NP   Copy of ICM check sent to Dr. Lalla Brothers   3 month ICM trend: 05/14/2022.    12-14 Month ICM trend:     Karie Soda, RN 05/15/2022 2:08 PM

## 2022-05-31 NOTE — Progress Notes (Signed)
Remote ICD transmission.   

## 2022-06-03 NOTE — Progress Notes (Unsigned)
Cardiology Office Note:    Date:  06/05/2022   ID:  [name], DOB May 04, 1950, MRN [id number]  PCP:  Marva Panda, NP   [facility] Providers Cardiologist:  Donato Schultz, MD Electrophysiologist:  Lanier Prude, MD     Referring MD: Marva Panda, NP   Chief Complaint: follow-up CAD, ICM  History of Present Illness:    [name] is a pleasant 72 y.o. female with a hx of CAD s/p CABG x 5 in 2007 with ischemic cardiomyopathy, LBBB, prior VT, ICD originally implanted in April 2008 with subsequent upgrades, DVT on chronic anticoagulation. Her LV port is plugged and nonfunctioning.  She has not been interested in pursuing cardiothoracic evaluation for epicardial LV lead.  Echo 2021 showed EF of approximately 50%.  However there is degradation of EF to 25% once again after V-fib appropriate ICD shock and January 2024 surrounding n/v/d. Goal-directed medical therapy was improved.  Cardiac catheterization following this most recent event in 2024 showed stable anatomy with patent grafts. EF decline was not ischemically mediated.Note, discharge weight was 230 lbs.   Cardiology clinic visit was 03/06/2022 with Dr. Anne Fu at which time blood sugar was slightly low at 62.  She was drinking ginger ale and eating graham crackers.  Again reiterated that she did not feel her ventricular fibrillation ICD shock in January 2024. Cardiac symptoms were stable and no changes were made to her medical therapy. She was advised to follow-up in 3 months.  Today, she is here with her son. Reports her CPAP has been broken 1 week or more. She does not know who prescribed it. Reports stable home weight, base weight 235 lbs. if her weight increases by more than 2 pounds she uses her home leg compression machine.  No specific cardiac symptoms.  She denies chest pain, palpitations, LE edema, orthopnea, PND.  Reports her shortness of breath is chronic and stable.  No AICD shocks. Admits she is  not very active. Walks from her house to her neighbors a few times daily for exercise.   Past Medical History:  Diagnosis Date   Abnormal LFTs    AICD (automatic cardioverter/defibrillator) present    Angina decubitus 11/06/2012   Angina pectoris (HCC)    Anxiety    Arthritis    Back pain    Bundle branch block 05/2016   CAD (coronary artery disease) 06/18/2010   CHF (congestive heart failure) (HCC)    Chronic combined systolic and diastolic CHF, NYHA class 3 (HCC)    Chronic combined systolic and diastolic heart failure (HCC)    Chronic systolic dysfunction of left ventricle 09/24/2012   Coronary artery disease    s/p CABG 2007   Depression    Dermal mycosis    Diabetes mellitus    type II   DKA (diabetic ketoacidoses) 01/12/2019   DVT (deep venous thrombosis) (HCC) 09/2015   bilateral   Edema, lower extremity    Glaucoma    Hyperlipidemia    Hypertension    Hypoglycemia 09/09/2012   Insomnia    Insulin dependent diabetes mellitus (HCC) 04/23/2012   Ischemic cardiomyopathy    EF 30-35%, s/p ICD 4/08 by Dr Amil Amen   Joint pain    LBBB (left bundle branch block) 09/24/2012   Morbid obesity (HCC) 11/06/2012   Obesity    Oxygen dependent 2 L   Pain in left knee    Peripheral neuropathy    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   LIMA to D2, SVG  to ramus intermediate, sequential SVG to OM1-OM2, SVG to RCA with EVH via both legs    Sleep apnea    uses O2 at night   Tinea    Type 2 diabetes mellitus with hyperglycemia, with long-term current use of insulin (HCC) 04/20/2019   Uncontrolled diabetes mellitus 02/2019   Ventricular tachycardia (HCC) 01/16/15   sustained VT terminated with ATP, CL 340 msec   Vitamin B 12 deficiency 11/05/2018   Vitamin D deficiency 11/05/2018    Past Surgical History:  Procedure Laterality Date   BI-VENTRICULAR IMPLANTABLE CARDIOVERTER DEFIBRILLATOR UPGRADE N/A 09/25/2012   Procedure: BI-VENTRICULAR IMPLANTABLE CARDIOVERTER DEFIBRILLATOR UPGRADE;  Surgeon: Gardiner Rhyme, MD;  Location: [facility] CATH LAB;  Service: Cardiovascular;  Laterality: N/A;   BREAST BIOPSY Right 04/05/2014   CARDIAC CATHETERIZATION     CARDIAC DEFIBRILLATOR PLACEMENT  4/08   by Dr Amil Amen (MDT)   COLONOSCOPY WITH PROPOFOL N/A 10/12/2016   Procedure: COLONOSCOPY WITH PROPOFOL;  Surgeon: Sherrilyn Rist, MD;  Location: [facility] ENDOSCOPY;  Service: Gastroenterology;  Laterality: N/A;   CORONARY ARTERY BYPASS GRAFT  10/05/2005   by Dr Laneta Simmers   IMPLANTABLE CARDIOVERTER DEFIBRILLATOR IMPLANT  09/25/12   attempt of upgrade to CRT-D unsuccessful due to CS anatomy, SJM Unify Asaura device placed with LV port capped by Dr Johney Frame   IR GENERIC HISTORICAL  10/03/2015   IR US GUIDE VASC ACCESS LEFT 10/03/2015 Malachy Moan, MD [facility]   IR GENERIC HISTORICAL  10/03/2015   IR VENO/EXT/UNI LEFT 10/03/2015 Malachy Moan, MD [facility]   IR GENERIC HISTORICAL  10/03/2015   IR VENOCAVAGRAM IVC 10/03/2015 Malachy Moan, MD [facility]   IR GENERIC HISTORICAL  10/03/2015   IR INFUSION THROMBOL VENOUS INITIAL (MS) 10/03/2015 Malachy Moan, MD [facility]   IR GENERIC HISTORICAL  10/03/2015   IR US GUIDE VASC ACCESS LEFT 10/03/2015 Malachy Moan, MD [facility]   IR GENERIC HISTORICAL  10/04/2015   IR THROMBECT VENO MECH MOD SED 10/04/2015 Simonne Come, MD [facility]   IR GENERIC HISTORICAL  10/04/2015   IR TRANSCATH PLC STENT 1ST ART NOT LE CV CAR VERT CAR 10/04/2015 Simonne Come, MD [facility]   IR GENERIC HISTORICAL  10/04/2015   IR THROMB F/U EVAL ART/VEN FINAL DAY (MS) 10/04/2015 Simonne Come, MD [facility]   IR GENERIC HISTORICAL  11/02/2015   IR RADIOLOGIST EVAL & MGMT 11/02/2015 Simonne Come, MD [facility]   IR RADIOLOGIST EVAL & MGMT  04/26/2016   LEFT HEART CATH AND CORS/GRAFTS ANGIOGRAPHY N/A 01/19/2019   Procedure: LEFT HEART CATH AND CORS/GRAFTS ANGIOGRAPHY;  Surgeon: Lyn Records, MD;  Location: [facility] LAB;  Service: Cardiovascular;  Laterality: N/A;   LEFT  HEART CATH AND CORS/GRAFTS ANGIOGRAPHY N/A 01/15/2022   Procedure: LEFT HEART CATH AND CORS/GRAFTS ANGIOGRAPHY;  Surgeon: Runell Gess, MD;  Location: [facility]AS[facility] CV LAB;  Service: Cardiovascular;  Laterality: N/A;    Current Medications: Current Meds  Medication Sig   albuterol (PROVENTIL) (2.5 MG/3ML) 0.083% nebulizer solution Take 2.5 mg by nebulization every 6 (six) hours as needed for wheezing or shortness of breath.    amiodarone (PACERONE) 200 MG tablet Take 0.5 tablets (100 mg total) by mouth daily.   Continuous Blood Gluc Sensor (DEXCOM G7 SENSOR) MISC 1 Device by Does not apply route as directed.   cyanocobalamin (,VITAMIN B-12,) 1000 MCG/ML injection 1,000 mcg every 30 (thirty) days.   dapagliflozin propanediol (FARXIGA) 10 MG TABS tablet Take 1 tablet (  10 mg total) by mouth daily.   ELIQUIS 5 MG TABS tablet TAKE 1 TABLET TWICE DAILY   ferrous sulfate 325 (65 FE) MG EC tablet Take 325 mg by mouth every other day.   gabapentin (NEURONTIN) 300 MG capsule Take 300 mg by mouth at bedtime.   glucose blood (ACCU-CHEK GUIDE) test strip Check 3 times daily   insulin degludec (TRESIBA FLEXTOUCH) 100 UNIT/ML FlexTouch Pen Inject 5 Units into the skin daily. (Patient taking differently: Inject 30 Units into the skin daily.)   Insulin Pen Needle 31G X 5 MM MISC 1 Device by Does not apply route in the morning, at noon, in the evening, and at bedtime.   isosorbide mononitrate (IMDUR) 30 MG 24 hr tablet Take 30 mg by mouth daily.   latanoprost (XALATAN) 0.005 % ophthalmic solution Place 1 drop into both eyes at bedtime.   methocarbamol (ROBAXIN) 500 MG tablet Take 500 mg by mouth every 8 (eight) hours as needed for muscle spasms.   nitroGLYCERIN (NITROSTAT) 0.4 MG SL tablet Place 1 tablet (0.4 mg total) under the tongue every 5 (five) minutes as needed for chest pain.   NOVOLOG FLEXPEN 100 UNIT/ML FlexPen INJECT PER SLIDING SCALE AS DIRECTED , MAX 30 UNITS PER DAY. DISCARD PEN 28 DAYS AFTER  OPENING (Patient taking differently: Inject 10-15 Units into the skin 3 (three) times daily with meals. Per sliding scale)   potassium chloride SA (KLOR-CON) 20 MEQ tablet Take 20 mEq by mouth daily as needed (for low pottassium).   rosuvastatin (CRESTOR) 20 MG tablet Take 1 tablet (20 mg total) by mouth daily at 6 PM.   sacubitril-valsartan (ENTRESTO) 24-26 MG Take 1 tablet by mouth 2 (two) times daily.   TRUE METRIX BLOOD GLUCOSE TEST test strip TEST THREE TIMES DAILY   Vitamin D, Ergocalciferol, (DRISDOL) 1.25 MG (50000 UT) CAPS capsule Take 1 capsule (50,000 Units total) by mouth every 7 (seven) days.     Allergies:   Metformin and related, Ozempic (0.25 or 0.5 mg-dose) [semaglutide(0.25 or 0.5mg -dos)], Atorvastatin calcium, and Levofloxacin   Social History   Socioeconomic History   Marital status: Legally Separated    Spouse name: Not on file   Number of children: 3   Years of education: 17   Highest education level: Not on file  Occupational History   Occupation: retired    Associate Professor: UNEMPLOYED  Tobacco Use   Smoking status: Never   Smokeless tobacco: Never  Vaping Use   Vaping Use: Never used  Substance and Sexual Activity   Alcohol use: No   Drug use: No   Sexual activity: Not Currently  Other Topics Concern   Not on file  Social History Narrative   Disabled Producer, television/film/video.  Currently taking sociology classes at [employer].   11/04/15 Lives with son    caffeine - coffee, 1-2 cups daily   Social Determinants of Health   Financial Resource Strain: Low Risk  (01/15/2022)   Overall Financial Resource Strain (CARDIA)    Difficulty of Paying Living Expenses: Not very hard  Food Insecurity: Food Insecurity Present (04/28/2022)   Hunger Vital Sign    Worried About Running Out of Food in the Last Year: Sometimes true    Ran Out of Food in the Last Year: Never true  Transportation Needs: No Transportation Needs (04/28/2022)   PRAPARE - Administrator, Civil Service  (Medical): No    Lack of Transportation (Non-Medical): No  Physical Activity: Not on file  Stress:  Not on file  Social Connections: Not on file     Family History: The patient's family history includes Diabetes (age of onset: 41) in her mother; High blood pressure in her mother; Obesity in her mother; Other in an other family member; Stroke in her mother; Sudden death in her mother.  ROS:   Please see the history of present illness.    + chronic dyspnea All other systems reviewed and are negative.  Labs/Other Studies Reviewed:    The following studies were reviewed today:  LHC 01/15/22   Mid LAD to Dist LAD lesion is 80% stenosed.   Prox Cx lesion is 100% stenosed.   Prox RCA to Mid RCA lesion is 100% stenosed.   Ramus lesion is 100% stenosed.   1st Diag lesion is 100% stenosed.   Prox Graft lesion is 50% stenosed.    IMPRESSION: Ms. Paulita Fujita has patent grafts and severe native vessel disease.  The only change in her anatomy is a 50% lesion in the mid shaft of the RCA vein graft that did not appear to be obstructive.  Her LVEDP was measured at 21.  The etiology of her LV EF decline is not ischemically mediated.  Guideline directed optimal medical therapy will be recommended.  A Mynx closure device was successfully deployed achieving hemostasis.  Patient left lab stable condition.  Dr. Steffanie Dunn, her electrophysiologist, was made aware of these results.  Echo 01/13/22 1. Left ventricular ejection fraction, by estimation, is 25 to 30%. The  left ventricle has severely decreased function. The left ventricle  demonstrates global hypokinesis. There is moderate left ventricular  hypertrophy. Left ventricular diastolic  parameters are consistent with Grade I diastolic dysfunction (impaired  relaxation).   2. Right ventricular systolic function is mildly reduced. The right  ventricular size is mildly enlarged. There is normal pulmonary artery  systolic pressure. The estimated right  ventricular systolic pressure is  16.4 mmHg.   3. The mitral valve is grossly normal. No evidence of mitral valve  regurgitation. No evidence of mitral stenosis.   4. The aortic valve was not well visualized. Aortic valve regurgitation  is not visualized. Aortic valve sclerosis/calcification is present,  without any evidence of aortic stenosis.   5. The inferior vena cava is normal in size with greater than 50%  respiratory variability, suggesting right atrial pressure of 3 mmHg.   Comparison(s): Changes from prior study are noted. LVEF worsened from 50%  in 2021 to 25% now.  Carotid Duplex 10/27/21 Summary:  Right Carotid: Velocities in the right ICA are consistent with a 1-39%  stenosis.                The ECA appears >50% stenosed.   Left Carotid: Velocities in the left ICA are consistent with a 1-39%  stenosis.   Vertebrals: Bilateral vertebral arteries demonstrate antegrade flow.  Subclavians: Normal flow hemodynamics were seen in bilateral subclavian               arteries.   *See table(s) above for measurements and observations.     Recent Labs: 01/31/2022: TSH 2.060 04/27/2022: B Natriuretic Peptide 81.7 04/28/2022: ALT 21; Magnesium 2.3 05/01/2022: BUN 24; Creatinine, Ser 1.19; Hemoglobin 12.6; Platelets 121; Potassium 4.8; Sodium 134  Recent Lipid Panel    Component Value Date/Time   CHOL 125 04/28/2021 1003   CHOL 111 11/03/2018 1205   CHOL 90 12/22/2012 1042   TRIG 78.0 04/28/2021 1003   TRIG 85 12/22/2012 1042   HDL  44.20 04/28/2021 1003   HDL 30 (L) 11/03/2018 1205   HDL 33 (L) 12/22/2012 1042   CHOLHDL 3 04/28/2021 1003   VLDL 15.6 04/28/2021 1003   LDLCALC 65 04/28/2021 1003   LDLCALC 64 11/03/2018 1205   LDLCALC 40 12/22/2012 1042     Risk Assessment/Calculations:           Physical Exam:    VS:  BP 112/78   Pulse (!) 57   Ht 5\' 1"  (1.549 m)   Wt 235 lb (106.6 kg)   SpO2 97%   BMI 44.40 kg/m     Wt Readings from Last 3 Encounters:   06/05/22 235 lb (106.6 kg)  05/07/22 234 lb 9.6 oz (106.4 kg)  04/27/22 234 lb (106.1 kg)     GEN: Obese, well developed in no acute distress HEENT: Normal NECK: No JVD; No carotid bruits CARDIAC: RRR, no murmurs, rubs, gallops RESPIRATORY:  Clear to auscultation without rales, wheezing or rhonchi  ABDOMEN: Soft, non-tender, non-distended MUSCULOSKELETAL:  No edema; No deformity. 2+ pedal pulses, equal bilaterally SKIN: Warm and dry NEUROLOGIC:  Alert and oriented x 3 PSYCHIATRIC:  Normal affect   EKG:  EKG is not ordered today.         Diagnoses:    1. Chronic combined systolic and diastolic heart failure (HCC)   2. Coronary artery disease involving native coronary artery of native heart without angina pectoris   3. Ischemic cardiomyopathy   4. ICD (implantable cardioverter-defibrillator) in place   5. Ventricular fibrillation (HCC)   6. History of DVT (deep vein thrombosis)   7. Chronic anticoagulation   8. S/P CABG x 5   9. Hyperlipidemia LDL goal <50   10. OSA (obstructive sleep apnea)    Assessment and Plan:     Ischemic cardiomyopathy/Chronic combined CHF: LVEF 25 to 30%, G1 DD, mildly reduced RV function, mildly enlarged RV,  on echo 01/2022. Volume status is difficult to assess due to body habitus. She denies edema, orthopnea, PND. Has chronic DOE she feels is stable. Had hypotension on extra Lasix for edema with subsequent admission 4/19-4/23/24.  Carvedilol was held due to bradycardia and hypotension. Marcelline Deist was held 2/2 AKI.  SCr 1.19 on 05/01/2022.  Sherryll Burger was resumed at hospital d/c, she thinks she is also taking [place]. We will recheck BMP today and call her with lab results and to review medications.  Plan to continue [place], Farxiga, isosorbide if renal function and electrolytes are stable.  Will repeat echocardiogram to see if EF has improved on GDMT.  History of VT s/p AICD implant: ICD shock 01/2022 in the setting of n/v/d. Amiodarone reduced to 100 mg  daily during hospital admission 400 mg daily.  She is asymptomatic.  HR is stable at 57 bpm. Remote device check shows stable device function on 04/23/22.  Continue remote device checks and follow-up with EP as scheduled.  CAD without angina: S/p CABG x 5 in 2007.  Most recent cardiac cath 01/15/2022 revealed patent grafts and severe native vessel disease. New finding of 50% lesion in mid shaft of RCA vein graft that did not appear to be obstructive. She denies chest pain or other symptoms concerning for angina.  No indication for further ischemic evaluation at this time.  Continue Imdur, rosuvastatin. Not on aspirin in the setting of OAC.   OSA: Reports CPAP is broken but does not know who prescribed it. Advised to start with PCP to see if she can assist.   Hyperlipidemia: LDL 65  on 04/28/2021.  We will recheck today. Continue rosuvastatin.  History of DVT on chronic anticoagulation: No bleeding concerns. No concerns for increased leg swelling or discomfort. No bleeding concerns on OAC. Management per VVS.     Disposition:  3 months with Dr. Anne Fu or me  Medication Adjustments/Labs and Tests Ordered: Current medicines are reviewed at length with the patient today.  Concerns regarding medicines are outlined above.  Orders Placed This Encounter  Procedures   Lipid Profile   Comp Met (CMET)   ECHOCARDIOGRAM COMPLETE   No orders of the defined types were placed in this encounter.   Patient Instructions  Medication Instructions:   Your physician recommends that you continue on your current medications as directed. Please refer to the Current Medication list given to you today.   *If you need a refill on your cardiac medications before your next appointment, please call your pharmacy*   Lab Work:  TODAY!!!! LIPID/CMET  If you have labs (blood work) drawn today and your tests are completely normal, you will receive your results only by: MyChart Message (if you have MyChart) OR A paper  copy in the mail If you have any lab test that is abnormal or we need to change your treatment, we will call you to review the results.   Testing/Procedures:  Your physician has requested that you have an echocardiogram. Echocardiography is a painless test that uses sound waves to create images of your heart. It provides your doctor with information about the size and shape of your heart and how well your heart's chambers and valves are working. This procedure takes approximately one hour. There are no restrictions for this procedure. Please do NOT wear perfume, aftershave, or lotions (deodorant is allowed). Please arrive 15 minutes prior to your appointment time.    Follow-Up: At [facility], you and your health needs are our priority.  As part of our continuing mission to provide you with exceptional heart care, we have created designated Provider Care Teams.  These Care Teams include your primary Cardiologist (physician) and Advanced Practice Providers (APPs -  Physician Assistants and Nurse Practitioners) who all work together to provide you with the care you need, when you need it.  We recommend signing up for the patient portal called "MyChart".  Sign up information is provided on this After Visit Summary.  MyChart is used to connect with patients for Virtual Visits (Telemedicine).  Patients are able to view lab/test results, encounter notes, upcoming appointments, etc.  Non-urgent messages can be sent to your provider as well.   To learn more about what you can do with MyChart, go to [email or web address].    Your next appointment:   3 month(s)  Provider:   Donato Schultz, MD       Signed, Levi Aland, NP  06/05/2022 5:45 PM    [facility]

## 2022-06-05 ENCOUNTER — Ambulatory Visit: Payer: Medicare HMO | Attending: Nurse Practitioner | Admitting: Nurse Practitioner

## 2022-06-05 ENCOUNTER — Encounter: Payer: Self-pay | Admitting: Nurse Practitioner

## 2022-06-05 VITALS — BP 112/78 | HR 57 | Ht 61.0 in | Wt 235.0 lb

## 2022-06-05 DIAGNOSIS — I4901 Ventricular fibrillation: Secondary | ICD-10-CM

## 2022-06-05 DIAGNOSIS — E785 Hyperlipidemia, unspecified: Secondary | ICD-10-CM

## 2022-06-05 DIAGNOSIS — Z86718 Personal history of other venous thrombosis and embolism: Secondary | ICD-10-CM

## 2022-06-05 DIAGNOSIS — Z951 Presence of aortocoronary bypass graft: Secondary | ICD-10-CM

## 2022-06-05 DIAGNOSIS — I5042 Chronic combined systolic (congestive) and diastolic (congestive) heart failure: Secondary | ICD-10-CM

## 2022-06-05 DIAGNOSIS — Z7901 Long term (current) use of anticoagulants: Secondary | ICD-10-CM

## 2022-06-05 DIAGNOSIS — I2510 Atherosclerotic heart disease of native coronary artery without angina pectoris: Secondary | ICD-10-CM

## 2022-06-05 DIAGNOSIS — I255 Ischemic cardiomyopathy: Secondary | ICD-10-CM | POA: Diagnosis not present

## 2022-06-05 DIAGNOSIS — Z95810 Presence of automatic (implantable) cardiac defibrillator: Secondary | ICD-10-CM

## 2022-06-05 DIAGNOSIS — G4733 Obstructive sleep apnea (adult) (pediatric): Secondary | ICD-10-CM

## 2022-06-05 NOTE — Patient Instructions (Signed)
Medication Instructions:   Your physician recommends that you continue on your current medications as directed. Please refer to the Current Medication list given to you today.   *If you need a refill on your cardiac medications before your next appointment, please call your pharmacy*   Lab Work:  TODAY!!!! LIPID/CMET  If you have labs (blood work) drawn today and your tests are completely normal, you will receive your results only by: MyChart Message (if you have MyChart) OR A paper copy in the mail If you have any lab test that is abnormal or we need to change your treatment, we will call you to review the results.   Testing/Procedures:  Your physician has requested that you have an echocardiogram. Echocardiography is a painless test that uses sound waves to create images of your heart. It provides your doctor with information about the size and shape of your heart and how well your heart's chambers and valves are working. This procedure takes approximately one hour. There are no restrictions for this procedure. Please do NOT wear perfume, aftershave, or lotions (deodorant is allowed). Please arrive 15 minutes prior to your appointment time.    Follow-Up: At [facility], you and your health needs are our priority.  As part of our continuing mission to provide you with exceptional heart care, we have created designated Provider Care Teams.  These Care Teams include your primary Cardiologist (physician) and Advanced Practice Providers (APPs -  Physician Assistants and Nurse Practitioners) who all work together to provide you with the care you need, when you need it.  We recommend signing up for the patient portal called "MyChart".  Sign up information is provided on this After Visit Summary.  MyChart is used to connect with patients for Virtual Visits (Telemedicine).  Patients are able to view lab/test results, encounter notes, upcoming appointments, etc.  Non-urgent messages  can be sent to your provider as well.   To learn more about what you can do with MyChart, go to [email or web address].    Your next appointment:   3 month(s)  Provider:   Donato Schultz, MD

## 2022-06-06 ENCOUNTER — Other Ambulatory Visit: Payer: Self-pay | Admitting: *Deleted

## 2022-06-06 LAB — COMPREHENSIVE METABOLIC PANEL
ALT: 22 IU/L (ref 0–32)
AST: 21 IU/L (ref 0–40)
Albumin/Globulin Ratio: 1.4 (ref 1.2–2.2)
Albumin: 4.3 g/dL (ref 3.8–4.8)
Alkaline Phosphatase: 96 IU/L (ref 44–121)
BUN/Creatinine Ratio: 17 (ref 12–28)
BUN: 17 mg/dL (ref 8–27)
Bilirubin Total: 0.4 mg/dL (ref 0.0–1.2)
CO2: 18 mmol/L — ABNORMAL LOW (ref 20–29)
Calcium: 9.6 mg/dL (ref 8.7–10.3)
Chloride: 108 mmol/L — ABNORMAL HIGH (ref 96–106)
Creatinine, Ser: 1.01 mg/dL — ABNORMAL HIGH (ref 0.57–1.00)
Globulin, Total: 3 g/dL (ref 1.5–4.5)
Glucose: 77 mg/dL (ref 70–99)
Potassium: 4.1 mmol/L (ref 3.5–5.2)
Sodium: 141 mmol/L (ref 134–144)
Total Protein: 7.3 g/dL (ref 6.0–8.5)
eGFR: 59 mL/min/{1.73_m2} — ABNORMAL LOW (ref >59)

## 2022-06-06 LAB — LIPID PANEL
Chol/HDL Ratio: 3 ratio (ref 0.0–4.4)
Cholesterol, Total: 153 mg/dL (ref 100–199)
HDL: 51 mg/dL (ref >39)
LDL Chol Calc (NIH): 88 mg/dL (ref 0–99)
Triglycerides: 70 mg/dL (ref 0–149)
VLDL Cholesterol Cal: 14 mg/dL (ref 5–40)

## 2022-06-06 MED ORDER — EZETIMIBE 10 MG PO TABS: 10.0000 mg | ORAL_TABLET | Freq: Every day | ORAL | 3 refills | Status: DC

## 2022-06-13 ENCOUNTER — Encounter: Payer: Self-pay | Admitting: Podiatry

## 2022-06-13 ENCOUNTER — Ambulatory Visit (INDEPENDENT_AMBULATORY_CARE_PROVIDER_SITE_OTHER): Payer: Medicare HMO | Admitting: Podiatry

## 2022-06-13 VITALS — BP 142/63

## 2022-06-13 DIAGNOSIS — M79675 Pain in left toe(s): Secondary | ICD-10-CM | POA: Diagnosis not present

## 2022-06-13 DIAGNOSIS — B351 Tinea unguium: Secondary | ICD-10-CM | POA: Diagnosis not present

## 2022-06-13 DIAGNOSIS — E1151 Type 2 diabetes mellitus with diabetic peripheral angiopathy without gangrene: Secondary | ICD-10-CM

## 2022-06-13 DIAGNOSIS — M79674 Pain in right toe(s): Secondary | ICD-10-CM | POA: Diagnosis not present

## 2022-06-18 ENCOUNTER — Ambulatory Visit (INDEPENDENT_AMBULATORY_CARE_PROVIDER_SITE_OTHER): Payer: Medicare HMO | Admitting: Podiatry

## 2022-06-18 ENCOUNTER — Ambulatory Visit: Payer: Medicare HMO | Attending: Cardiology

## 2022-06-18 DIAGNOSIS — M2142 Flat foot [pes planus] (acquired), left foot: Secondary | ICD-10-CM

## 2022-06-18 DIAGNOSIS — Z95810 Presence of automatic (implantable) cardiac defibrillator: Secondary | ICD-10-CM

## 2022-06-18 DIAGNOSIS — I5042 Chronic combined systolic (congestive) and diastolic (congestive) heart failure: Secondary | ICD-10-CM | POA: Diagnosis not present

## 2022-06-18 DIAGNOSIS — E1151 Type 2 diabetes mellitus with diabetic peripheral angiopathy without gangrene: Secondary | ICD-10-CM

## 2022-06-18 DIAGNOSIS — M2141 Flat foot [pes planus] (acquired), right foot: Secondary | ICD-10-CM

## 2022-06-18 NOTE — Progress Notes (Unsigned)
Patient presents today to be measured for diabetic shoes and insoles.  Patient was measured for 1 pair of diabetic shoes and 3 pairs of foam casted diabetic insoles.   Wt 236 Shoe size  9xw Shoe type 844 or 741 [id number]  Treating physician  Dr Brooks Sailors  Re-appointment for regularly scheduled diabetic foot care visits or if they should experience any trouble with the shoes or insoles.

## 2022-06-20 NOTE — Progress Notes (Signed)
  Subjective:  Patient ID: [name], female    DOB: 06-02-1950,  MRN: [id number]  [name] presents to clinic today for at risk foot care. Pt has h/o NIDDM with PAD Chief Complaint  Patient presents with   Nail Problem    DFC,Referring Provider Jamie Panda, NP,LOV:04/24,BS:115,A1C:7.4       New problem(s): None.   PCP is Jamie Panda, NP.  Allergies  Allergen Reactions   Metformin And Related Itching, Swelling and Other (See Comments)    Leg pain & swelling in legs   Ozempic (0.25 Or 0.5 Mg-Dose) [Semaglutide(0.25 Or 0.5mg -Dos)] Other (See Comments)    Pt with hx of pancreatitis    Atorvastatin Calcium Itching and Rash   Levofloxacin Itching    Review of Systems: Negative except as noted in the HPI. Objective:   Vitals:   06/13/22 1634 06/13/22 1635  BP: (!) 172/72 (!) 142/63   Constitutional [name] is a pleasant 72 y.o. female, morbidly obese in NAD. AAO x 3.    Vascular Examination: CFT <4 seconds b/l. DP pulses diminished b/l. PT pulses diminished b/l. Digital hair absent. Skin temperature gradient warm to cool b/l. Trace edema b/l. No ischemia or gangrene. No cyanosis or clubbing noted b/l.    Neurological Examination: Sensation grossly intact b/l with 10 gram monofilament. Vibratory sensation intact b/l. Pt has subjective symptoms of neuropathy.  Dermatological Examination: Pedal skin thin, shiny and atrophic b/l. No open wounds. No interdigital macerations.   Toenails 1-5 b/l thick, discolored, elongated with subungual debris and pain on dorsal palpation.   No hyperkeratotic nor porokeratotic lesions present on today's visit.  Musculoskeletal Examination: Muscle strength 5/5 to b/l LE. Pes planus deformity noted bilateral LE.  Radiographs: None  Last A1c:      Latest Ref Rng & Units 05/07/2022    8:31 AM 10/29/2021   11:05 PM  Hemoglobin A1C  Hemoglobin-A1c 4.0 - 5.6 % 7.4  7.7    Radiographs: None     Assessment:   1. Pain due to onychomycosis of toenails of both feet   2. Type II diabetes mellitus with peripheral circulatory disorder [facility])    Plan:  Patient was evaluated and treated and all questions answered. Consent given for treatment as described below: -Continue supportive shoe gear daily. -Mycotic toenails 1-5 bilaterally were debrided in length and girth with sterile nail nippers and dremel without incident. -Patient/POA to call should there be question/concern in the interim.  Return in about 3 months (around 09/13/2022).  [name], DPM

## 2022-06-20 NOTE — Progress Notes (Signed)
EPIC Encounter for ICM Monitoring  Patient Name: [name] is a 72 y.o. female Date: 06/20/2022 Primary Care Physican: Marva Panda, NP Primary Cardiologist: Skains/Weaver PA  Electrophysiologist: Lalla Brothers 10/04/2021 Weight: 237 lbs 11/06/2021 Weight: 232 lbs 03/05/2022 Weight: 235 lbs 04/16/2022 Weight: 230 lbs 05/15/2022 Weight:  230 lbs   AT/AF Burden: 0% (taking Eliquis)   Battery Longevity: 6.3 months                                                          Spoke with patient and heart failure questions reviewed.  Transmission results reviewed.  Pt asymptomatic for fluid accumulation.  Reports feeling well at this time and voices no complaints.  Pt reports her aide said one foot feels cold.  Advised to call PCP office today and schedule an appointment and to tell the office the reason for the call.  She agreed.     CorVue thoracic impedance suggesting intermittent days with possible fluid accumulation within the last month.      Prescribed: No diuretic since 04/2022 hospital discharge   Labs: 05/01/2022 Creatinine 1.19, BUN 24, Potassium 4.8, Sodium 134, GFR 49  04/30/2022 Creatinine 1.43, BUN 19, Potassium 4.7, Sodium 137, GFR 39  04/29/2022 Creatinine 1.23, BUN 18, Potassium 4.6, Sodium 135, GFR 47  04/28/2022 Creatinine 1.33, BUN 21, Potassium 3.3, Sodium 137, GFR 43 04/27/2022 Creatinine 1.42, BUN 24, Potassium 5.3, Sodium 140, GFR 39  A complete set of results can be found in Results Review.   Recommendations: No changes and encouraged to call if experiencing any fluid symptoms.   Next ICM clinic phone appointment due: 07/24/2022.     Next 91 day remote transmission due: 07/23/2022.     EP/Cardiology Office Visits:  08/08/2022 with Dr Lalla Brothers.  08/21/2022 with Dr Anne Fu.   Copy of ICM check sent to Dr. Lalla Brothers   3 month ICM trend: 06/18/2022.    12-14 Month ICM trend:     Karie Soda, RN 06/20/2022 10:04 AM

## 2022-06-29 ENCOUNTER — Other Ambulatory Visit: Payer: Self-pay | Admitting: *Deleted

## 2022-06-29 DIAGNOSIS — Z0000 Encounter for general adult medical examination without abnormal findings: Secondary | ICD-10-CM

## 2022-07-04 ENCOUNTER — Ambulatory Visit (HOSPITAL_COMMUNITY): Payer: Medicare HMO

## 2022-07-16 ENCOUNTER — Other Ambulatory Visit: Payer: Self-pay | Admitting: Cardiology

## 2022-07-18 LAB — HM DIABETES EYE EXAM

## 2022-07-23 ENCOUNTER — Ambulatory Visit (INDEPENDENT_AMBULATORY_CARE_PROVIDER_SITE_OTHER): Payer: Medicare HMO

## 2022-07-23 DIAGNOSIS — I255 Ischemic cardiomyopathy: Secondary | ICD-10-CM

## 2022-07-24 ENCOUNTER — Ambulatory Visit: Payer: Medicare HMO | Attending: Cardiology

## 2022-07-24 DIAGNOSIS — Z95810 Presence of automatic (implantable) cardiac defibrillator: Secondary | ICD-10-CM

## 2022-07-24 DIAGNOSIS — I5042 Chronic combined systolic (congestive) and diastolic (congestive) heart failure: Secondary | ICD-10-CM

## 2022-07-25 ENCOUNTER — Encounter: Payer: Self-pay | Admitting: Internal Medicine

## 2022-07-25 ENCOUNTER — Other Ambulatory Visit: Payer: Self-pay | Admitting: Cardiology

## 2022-07-25 LAB — CUP PACEART REMOTE DEVICE CHECK
Battery Remaining Longevity: 6 mo
Battery Remaining Percentage: 6 %
Battery Voltage: 2.63 V
Brady Statistic AP VP Percent: 3 %
Brady Statistic AP VS Percent: 40 %
Brady Statistic AS VP Percent: 1 %
Brady Statistic AS VS Percent: 54 %
Brady Statistic RA Percent Paced: 37 %
Brady Statistic RV Percent Paced: 3.5 %
Date Time Interrogation Session: 20240715020028
HighPow Impedance: 37 Ohm
HighPow Impedance: 37 Ohm
Implantable Lead Connection Status: 753985
Implantable Lead Connection Status: 753985
Implantable Lead Connection Status: 753985
Implantable Lead Implant Date: 20080423
Implantable Lead Implant Date: 20080423
Implantable Lead Implant Date: 20140918
Implantable Lead Location: 753858
Implantable Lead Location: 753859
Implantable Lead Location: 753860
Implantable Lead Model: 5076
Implantable Lead Model: 6947
Implantable Pulse Generator Implant Date: 20140918
Lead Channel Impedance Value: 390 Ohm
Lead Channel Impedance Value: 440 Ohm
Lead Channel Pacing Threshold Amplitude: 0.75 V
Lead Channel Pacing Threshold Amplitude: 0.75 V
Lead Channel Pacing Threshold Pulse Width: 0.5 ms
Lead Channel Pacing Threshold Pulse Width: 0.5 ms
Lead Channel Sensing Intrinsic Amplitude: 1.5 mV
Lead Channel Sensing Intrinsic Amplitude: 12 mV
Lead Channel Setting Pacing Amplitude: 2 V
Lead Channel Setting Pacing Amplitude: 2.5 V
Lead Channel Setting Pacing Pulse Width: 0.5 ms
Lead Channel Setting Sensing Sensitivity: 0.5 mV
Pulse Gen Serial Number: 7070007

## 2022-07-25 LAB — HM DIABETES EYE EXAM

## 2022-07-25 NOTE — Telephone Encounter (Signed)
Pt's pharmacy is requesting a refill on amiodarone. This medication was prescribed in the hospital. Would Dr. Lalla Brothers like to refill this medication? Please address

## 2022-07-26 ENCOUNTER — Other Ambulatory Visit: Payer: Medicare HMO

## 2022-07-29 NOTE — Progress Notes (Signed)
EPIC Encounter for ICM Monitoring  Patient Name: [name] is a 72 y.o. female Date: 07/29/2022 Primary Care Physican: Elie Confer, NP Primary Cardiologist: Skains/Weaver PA  Electrophysiologist: Lalla Brothers 10/04/2021 Weight: 237 lbs 11/06/2021 Weight: 232 lbs 03/05/2022 Weight: 235 lbs 04/16/2022 Weight: 230 lbs 05/15/2022 Weight:  230 lbs   AT/AF Burden: 0% (taking Eliquis)   Battery Longevity: 6.1 months                                                          Transmission results reviewed.      CorVue thoracic impedance suggesting normal fluid levels with the exception of possible fluid accumulation from 6/16-6/26.      Prescribed: No diuretic since 04/2022 hospital discharge   Labs: 06/05/2022 Creatinine 1.01, BUN 17, Potassium 4.1, Sodium 141, GFR 59 05/01/2022 Creatinine 1.19, BUN 24, Potassium 4.8, Sodium 134, GFR 49  04/30/2022 Creatinine 1.43, BUN 19, Potassium 4.7, Sodium 137, GFR 39  04/29/2022 Creatinine 1.23, BUN 18, Potassium 4.6, Sodium 135, GFR 47  04/28/2022 Creatinine 1.33, BUN 21, Potassium 3.3, Sodium 137, GFR 43 04/27/2022 Creatinine 1.42, BUN 24, Potassium 5.3, Sodium 140, GFR 39  A complete set of results can be found in Results Review.   Recommendations: No changes.   Next ICM clinic phone appointment due: 08/27/2022.     Next 91 day remote transmission due: 10/22/2022.     EP/Cardiology Office Visits:  08/08/2022 with Dr Lalla Brothers.  08/21/2022 with Dr Anne Fu.   Copy of ICM check sent to Dr. Lalla Brothers.  3 month ICM trend: 07/24/2022.    12-14 Month ICM trend:     Karie Soda, RN 07/29/2022 10:07 AM

## 2022-07-30 ENCOUNTER — Ambulatory Visit (INDEPENDENT_AMBULATORY_CARE_PROVIDER_SITE_OTHER): Payer: Medicare HMO | Admitting: Podiatry

## 2022-07-30 DIAGNOSIS — R6889 Other general symptoms and signs: Secondary | ICD-10-CM

## 2022-07-30 DIAGNOSIS — M2142 Flat foot [pes planus] (acquired), left foot: Secondary | ICD-10-CM | POA: Diagnosis not present

## 2022-07-30 DIAGNOSIS — M2141 Flat foot [pes planus] (acquired), right foot: Secondary | ICD-10-CM | POA: Diagnosis not present

## 2022-07-30 DIAGNOSIS — E1151 Type 2 diabetes mellitus with diabetic peripheral angiopathy without gangrene: Secondary | ICD-10-CM

## 2022-07-30 NOTE — Progress Notes (Signed)

## 2022-07-31 ENCOUNTER — Ambulatory Visit (HOSPITAL_COMMUNITY): Payer: Medicare HMO | Attending: Cardiology

## 2022-07-31 DIAGNOSIS — I2510 Atherosclerotic heart disease of native coronary artery without angina pectoris: Secondary | ICD-10-CM | POA: Diagnosis not present

## 2022-07-31 DIAGNOSIS — I5042 Chronic combined systolic (congestive) and diastolic (congestive) heart failure: Secondary | ICD-10-CM | POA: Diagnosis not present

## 2022-07-31 LAB — ECHOCARDIOGRAM COMPLETE
Area-P 1/2: 2.24 cm2
S' Lateral: 4.1 cm

## 2022-07-31 MED ORDER — PERFLUTREN LIPID MICROSPHERE
1.0000 mL | INTRAVENOUS | Status: AC | PRN
Start: 2022-07-31 — End: 2022-07-31
  Administered 2022-07-31: 2 mL via INTRAVENOUS

## 2022-08-02 ENCOUNTER — Ambulatory Visit
Admission: RE | Admit: 2022-08-02 | Discharge: 2022-08-02 | Disposition: A | Discharge status: Home / Self Care | Payer: Medicare HMO | Source: Ambulatory Visit | Attending: *Deleted | Admitting: *Deleted

## 2022-08-02 DIAGNOSIS — Z0000 Encounter for general adult medical examination without abnormal findings: Secondary | ICD-10-CM

## 2022-08-07 NOTE — Progress Notes (Unsigned)
  Electrophysiology Office Follow up Visit Note:    Date:  08/08/2022   ID:  [name], DOB 10/26/1950, MRN [id number]  PCP:  Elie Confer, NP  [facility] Cardiologist:  Donato Schultz, MD  [facility] Electrophysiologist:  Lanier Prude, MD    Interval History:    [name] is a 72 y.o. female who presents for a follow up visit.   I last saw the patient January 31, 2022 for history of ventricular fibrillation and an ICD in situ.  She is maintained on amiodarone. She was hospitalized in April of this year with a hypotensive episode.  Her hypotension was not thought to be related to an arrhythmia.    She is with family today in clinic.  No other problems since I last saw her.    Past medical, surgical, social and family history were reviewed.  ROS:   Please see the history of present illness.    All other systems reviewed and are negative.  EKGs/Labs/Other Studies Reviewed:    The following studies were reviewed today:  July 31, 2022 echo EF 25 to 30%, global hypokinesis RV function mildly reduced  April 30, 2022 EKG shows sinus rhythm with left bundle branch block  August 08, 2022 in clinic device interrogation personally reviewed 6 mo estimated until ERI Lead parameters stable.       Physical Exam:    VS:  BP 126/66   Pulse (!) 59   Ht 5\' 3"  (1.6 m)   Wt 240 lb (108.9 kg)   SpO2 97%   BMI 42.51 kg/m     Wt Readings from Last 3 Encounters:  08/08/22 240 lb (108.9 kg)  06/05/22 235 lb (106.6 kg)  05/07/22 234 lb 9.6 oz (106.4 kg)     GEN:  Well nourished, well developed in no acute distress CARDIAC: RRR, no murmurs, rubs, gallops.  ICD pocket well-healed RESPIRATORY:  Clear to auscultation without rales, wheezing or rhonchi       ASSESSMENT:    1. Chronic combined systolic and diastolic heart failure (HCC)   2. ICD (implantable cardioverter-defibrillator) in place   3. Encounter for long-term (current) use of high-risk  medication    PLAN:    In order of problems listed above:  #Chronic systolic heart failure #ICD in situ Device functioning appropriately.  NYHA class II-III symptoms.  Warm dry on exam.  Continue Entresto I discussed the generator replacement procedure in detail with the patient during today's appointment.  We will get this scheduled for her when her device reaches ERI.  She will need to hold her Eliquis for 48 hours prior to generator replacement.  #High risk drug monitoring-amiodarone For history of ventricular fibrillation.  May blood work showed normal liver function.  January blood work showed normal thyroid function.  Follow-up 6 months with APP CMP, TSH and free T4 at that appointment.       Signed, Steffanie Dunn, MD, [facility], [facility] 08/08/2022 1:34 PM    Electrophysiology [facility]

## 2022-08-08 ENCOUNTER — Encounter: Payer: Self-pay | Admitting: Cardiology

## 2022-08-08 ENCOUNTER — Ambulatory Visit: Payer: Medicare HMO | Attending: Cardiology | Admitting: Cardiology

## 2022-08-08 VITALS — BP 126/66 | HR 59 | Ht 63.0 in | Wt 240.0 lb

## 2022-08-08 DIAGNOSIS — Z79899 Other long term (current) drug therapy: Secondary | ICD-10-CM | POA: Diagnosis not present

## 2022-08-08 DIAGNOSIS — I5042 Chronic combined systolic (congestive) and diastolic (congestive) heart failure: Secondary | ICD-10-CM

## 2022-08-08 DIAGNOSIS — Z95810 Presence of automatic (implantable) cardiac defibrillator: Secondary | ICD-10-CM | POA: Diagnosis not present

## 2022-08-08 NOTE — Progress Notes (Signed)
Remote ICD transmission.   

## 2022-08-08 NOTE — Patient Instructions (Signed)
Medication Instructions:  Your physician recommends that you continue on your current medications as directed. Please refer to the Current Medication list given to you today.  *If you need a refill on your cardiac medications before your next appointment, please call your pharmacy*  Follow-Up: At [facility], you and your health needs are our priority.  As part of our continuing mission to provide you with exceptional heart care, we have created designated Provider Care Teams.  These Care Teams include your primary Cardiologist (physician) and Advanced Practice Providers (APPs -  Physician Assistants and Nurse Practitioners) who all work together to provide you with the care you need, when you need it.   Your next appointment:   6 month(s)  Provider:   You will see one of the following Advanced Practice Providers on your designated Care Team:   Francis Dowse, Charlott Holler [address]" [place], [place] Sherie Don, NP Canary Brim, NP

## 2022-08-21 ENCOUNTER — Ambulatory Visit: Payer: Medicare HMO | Admitting: Cardiology

## 2022-08-27 ENCOUNTER — Ambulatory Visit: Payer: Medicare HMO | Attending: Cardiology

## 2022-08-27 DIAGNOSIS — Z95810 Presence of automatic (implantable) cardiac defibrillator: Secondary | ICD-10-CM

## 2022-08-27 DIAGNOSIS — I5042 Chronic combined systolic (congestive) and diastolic (congestive) heart failure: Secondary | ICD-10-CM | POA: Diagnosis not present

## 2022-08-29 NOTE — Progress Notes (Signed)
EPIC Encounter for ICM Monitoring  Patient Name: [name] is a 72 y.o. female Date: 08/29/2022 Primary Care Physican: Elie Confer, NP Primary Cardiologist: Skains/Weaver PA  Electrophysiologist: Lalla Brothers 10/04/2021 Weight: 237 lbs 11/06/2021 Weight: 232 lbs 03/05/2022 Weight: 235 lbs 04/16/2022 Weight: 230 lbs 05/15/2022 Weight:  230 lbs 08/29/2022 Weight: 230 lbs   AT/AF Burden: 0% (taking Eliquis)   Battery Longevity: 4.7 months                                                          Transmission results reviewed.      CorVue thoracic impedance suggesting normal fluid levels with the exception of possible fluid accumulation from 8/12-8/15.      Prescribed: No diuretic since 04/2022 hospital discharge   Labs: 06/05/2022 Creatinine 1.01, BUN 17, Potassium 4.1, Sodium 141, GFR 59 05/01/2022 Creatinine 1.19, BUN 24, Potassium 4.8, Sodium 134, GFR 49  04/30/2022 Creatinine 1.43, BUN 19, Potassium 4.7, Sodium 137, GFR 39  04/29/2022 Creatinine 1.23, BUN 18, Potassium 4.6, Sodium 135, GFR 47  04/28/2022 Creatinine 1.33, BUN 21, Potassium 3.3, Sodium 137, GFR 43 04/27/2022 Creatinine 1.42, BUN 24, Potassium 5.3, Sodium 140, GFR 39  A complete set of results can be found in Results Review.   Recommendations:   No changes and encouraged to call if experiencing any fluid symptoms.   Next ICM clinic phone appointment due:  10/01/2022.     Next 91 day remote transmission due: 10/22/2022.     EP/Cardiology Office Visits:  Recall 02/04/2023 with Dr Lalla Brothers.  12/12/2022 with Dr Anne Fu.   Copy of ICM check sent to Dr. Lalla Brothers.  3 month ICM trend: 08/27/2022.    12-14 Month ICM trend:     Karie Soda, RN 08/29/2022 2:21 PM

## 2022-08-31 ENCOUNTER — Other Ambulatory Visit: Payer: Self-pay

## 2022-08-31 ENCOUNTER — Emergency Department (HOSPITAL_COMMUNITY): Payer: Medicare HMO

## 2022-08-31 ENCOUNTER — Encounter (HOSPITAL_COMMUNITY): Payer: Self-pay

## 2022-08-31 ENCOUNTER — Emergency Department (HOSPITAL_COMMUNITY)
Admission: EM | Admit: 2022-08-31 | Discharge: 2022-08-31 | Disposition: A | Discharge status: Home / Self Care | Payer: Medicare HMO | Attending: Emergency Medicine | Admitting: Emergency Medicine

## 2022-08-31 DIAGNOSIS — I2510 Atherosclerotic heart disease of native coronary artery without angina pectoris: Secondary | ICD-10-CM | POA: Diagnosis not present

## 2022-08-31 DIAGNOSIS — E119 Type 2 diabetes mellitus without complications: Secondary | ICD-10-CM | POA: Insufficient documentation

## 2022-08-31 DIAGNOSIS — N83201 Unspecified ovarian cyst, right side: Secondary | ICD-10-CM | POA: Diagnosis not present

## 2022-08-31 DIAGNOSIS — S32030S Wedge compression fracture of third lumbar vertebra, sequela: Secondary | ICD-10-CM | POA: Diagnosis not present

## 2022-08-31 DIAGNOSIS — I10 Essential (primary) hypertension: Secondary | ICD-10-CM | POA: Insufficient documentation

## 2022-08-31 DIAGNOSIS — X501XXS Overexertion from prolonged static or awkward postures, sequela: Secondary | ICD-10-CM | POA: Insufficient documentation

## 2022-08-31 DIAGNOSIS — K8020 Calculus of gallbladder without cholecystitis without obstruction: Secondary | ICD-10-CM | POA: Insufficient documentation

## 2022-08-31 DIAGNOSIS — I700 Atherosclerosis of aorta: Secondary | ICD-10-CM | POA: Insufficient documentation

## 2022-08-31 DIAGNOSIS — M545 Low back pain, unspecified: Secondary | ICD-10-CM | POA: Insufficient documentation

## 2022-08-31 DIAGNOSIS — Z794 Long term (current) use of insulin: Secondary | ICD-10-CM | POA: Diagnosis not present

## 2022-08-31 DIAGNOSIS — Z79899 Other long term (current) drug therapy: Secondary | ICD-10-CM | POA: Insufficient documentation

## 2022-08-31 DIAGNOSIS — Z7901 Long term (current) use of anticoagulants: Secondary | ICD-10-CM | POA: Diagnosis not present

## 2022-08-31 DIAGNOSIS — S32020S Wedge compression fracture of second lumbar vertebra, sequela: Secondary | ICD-10-CM | POA: Diagnosis not present

## 2022-08-31 DIAGNOSIS — Z7984 Long term (current) use of oral hypoglycemic drugs: Secondary | ICD-10-CM | POA: Insufficient documentation

## 2022-08-31 DIAGNOSIS — N200 Calculus of kidney: Secondary | ICD-10-CM | POA: Diagnosis not present

## 2022-08-31 LAB — CBC WITH DIFFERENTIAL/PLATELET
Abs Immature Granulocytes: 0.01 10*3/uL (ref 0.00–0.07)
Basophils Absolute: 0 10*3/uL (ref 0.0–0.1)
Basophils Relative: 1 %
Eosinophils Absolute: 0.1 10*3/uL (ref 0.0–0.5)
Eosinophils Relative: 2 %
HCT: 46.6 % — ABNORMAL HIGH (ref 36.0–46.0)
Hemoglobin: 14.6 g/dL (ref 12.0–15.0)
Immature Granulocytes: 0 %
Lymphocytes Relative: 33 %
Lymphs Abs: 1.9 10*3/uL (ref 0.7–4.0)
MCH: 28.7 pg (ref 26.0–34.0)
MCHC: 31.3 g/dL (ref 30.0–36.0)
MCV: 91.7 fL (ref 80.0–100.0)
Monocytes Absolute: 0.5 10*3/uL (ref 0.1–1.0)
Monocytes Relative: 9 %
Neutro Abs: 3.1 10*3/uL (ref 1.7–7.7)
Neutrophils Relative %: 55 %
Platelets: 122 10*3/uL — ABNORMAL LOW (ref 150–400)
RBC: 5.08 MIL/uL (ref 3.87–5.11)
RDW: 15.2 % (ref 11.5–15.5)
WBC: 5.6 10*3/uL (ref 4.0–10.5)
nRBC: 0 % (ref 0.0–0.2)

## 2022-08-31 LAB — BASIC METABOLIC PANEL
Anion gap: 16 — ABNORMAL HIGH (ref 5–15)
BUN: 14 mg/dL (ref 8–23)
CO2: 22 mmol/L (ref 22–32)
Calcium: 9.2 mg/dL (ref 8.9–10.3)
Chloride: 100 mmol/L (ref 98–111)
Creatinine, Ser: 0.82 mg/dL (ref 0.44–1.00)
GFR, Estimated: >60 mL/min (ref >60)
Glucose, Bld: 111 mg/dL — ABNORMAL HIGH (ref 70–99)
Potassium: 5.6 mmol/L — ABNORMAL HIGH (ref 3.5–5.1)
Sodium: 138 mmol/L (ref 135–145)

## 2022-08-31 MED ORDER — HYDROCODONE-ACETAMINOPHEN 5-325 MG PO TABS: 1.0000 | ORAL_TABLET | ORAL | 0 refills | Status: DC | PRN

## 2022-08-31 MED ORDER — OXYCODONE-ACETAMINOPHEN 5-325 MG PO TABS
1.0000 | ORAL_TABLET | Freq: Once | ORAL | Status: AC
  Administered 2022-08-31: 1 via ORAL
  Filled 2022-08-31: qty 1

## 2022-08-31 MED ORDER — IOHEXOL 350 MG/ML SOLN
100.0000 mL | Freq: Once | INTRAVENOUS | Status: AC | PRN
  Administered 2022-08-31: 100 mL via INTRAVENOUS

## 2022-08-31 MED ORDER — HYDROCODONE-ACETAMINOPHEN 5-325 MG PO TABS
1.0000 | ORAL_TABLET | Freq: Once | ORAL | Status: AC
  Administered 2022-08-31: 1 via ORAL
  Filled 2022-08-31: qty 1

## 2022-08-31 NOTE — ED Triage Notes (Signed)
Patient BIB [facility] from home for back pain that started Wednesday after bending over to pick something up. VSS, brady at baseline. A&Ox4, able to stand and pivot. Patient reports 10/10 pain in lower back.

## 2022-08-31 NOTE — ED Provider Notes (Signed)
[place] EMERGENCY DEPARTMENT AT [facility] Provider Note   CSN: [id number] Arrival date & time: 08/31/22  1145     History  Chief Complaint  Patient presents with   Back Pain    [name] is a 72 y.o. female.  Patient complains of pain in her low back since bending over on Wednesday 2 days ago.  Patient reports pain on her right side in her mid lower back.  Patient used a lidocaine patch and had some initial relief but pain has become worse.  Patient reports that she did not have any falls she has not had any injury other than bending over.  Patient denies any abdominal pain.  She denies any weakness in her extremities.  Patient has a chronic wound on her right leg which she is followed at the wound care clinic for.  Patient has a past medical history of diabetes coronary artery disease and hypertension.  Patient has a history of back pain.  Patient has osteoarthritis of both knees.   Back Pain      Home Medications Prior to Admission medications   Medication Sig Start Date End Date Taking? Authorizing Provider  albuterol (PROVENTIL) (2.5 MG/3ML) 0.083% nebulizer solution Take 2.5 mg by nebulization every 6 (six) hours as needed for wheezing or shortness of breath.  09/30/15   [provider]  amiodarone (PACERONE) 200 MG tablet Take 0.5 tablets (100 mg total) by mouth daily. 07/25/22   Lanier Prude, MD  Continuous Blood Gluc Sensor (DEXCOM G7 SENSOR) MISC 1 Device by Does not apply route as directed. 11/03/21   Shamleffer, Konrad Dolores, MD  cyanocobalamin (,VITAMIN B-12,) 1000 MCG/ML injection 1,000 mcg every 30 (thirty) days.    [provider]  dapagliflozin propanediol (FARXIGA) 10 MG TABS tablet Take 1 tablet (10 mg total) by mouth daily. 11/03/21   Shamleffer, Konrad Dolores, MD  ELIQUIS 5 MG TABS tablet TAKE 1 TABLET TWICE DAILY 01/10/22   [name], MD  ferrous sulfate 325 (65 FE) MG EC tablet Take 325 mg by mouth  every other day.    [provider]  gabapentin (NEURONTIN) 300 MG capsule Take 300 mg by mouth at bedtime. 12/23/20   [provider]  glucose blood (ACCU-CHEK GUIDE) test strip Check 3 times daily 02/22/22   Shamleffer, Konrad Dolores, MD  insulin degludec (TRESIBA FLEXTOUCH) 100 UNIT/ML FlexTouch Pen Inject 5 Units into the skin daily. Patient taking differently: Inject 30 Units into the skin daily. 05/01/22   Lanae Boast, MD  Insulin Pen Needle 31G X 5 MM MISC 1 Device by Does not apply route in the morning, at noon, in the evening, and at bedtime. 11/03/21   Shamleffer, Konrad Dolores, MD  latanoprost (XALATAN) 0.005 % ophthalmic solution Place 1 drop into both eyes at bedtime.    [provider]  methocarbamol (ROBAXIN) 500 MG tablet Take 500 mg by mouth every 8 (eight) hours as needed for muscle spasms.    [provider]  nitroGLYCERIN (NITROSTAT) 0.4 MG SL tablet Place 1 tablet (0.4 mg total) under the tongue every 5 (five) minutes as needed for chest pain. 04/18/22   Jake Bathe, MD  NOVOLOG FLEXPEN 100 UNIT/ML FlexPen INJECT PER SLIDING SCALE AS DIRECTED , MAX 30 UNITS PER DAY. DISCARD PEN 28 DAYS AFTER OPENING Patient taking differently: Inject 10-15 Units into the skin 3 (three) times daily with meals. Per sliding scale 10/11/21   Shamleffer, Konrad Dolores, MD  rosuvastatin (  CRESTOR) 20 MG tablet Take 1 tablet (20 mg total) by mouth daily at 6 PM. 10/07/15   Alm Bustard, MD  sacubitril-valsartan (ENTRESTO) 24-26 MG Take 1 tablet by mouth 2 (two) times daily. 02/07/22   Lanier Prude, MD  TRUE METRIX BLOOD GLUCOSE TEST test strip TEST THREE TIMES DAILY 12/11/21   Shamleffer, Konrad Dolores, MD  Vitamin D, Ergocalciferol, (DRISDOL) 1.25 MG (50000 UT) CAPS capsule Take 1 capsule (50,000 Units total) by mouth every 7 (seven) days. 12/30/18   Corinna Capra A, DO      Allergies    Metformin and related, Ozempic (0.25 or 0.5 mg-dose)  [semaglutide(0.25 or 0.5mg -dos)], Atorvastatin calcium, and Levofloxacin    Review of Systems   Review of Systems  Musculoskeletal:  Positive for back pain.  All other systems reviewed and are negative.   Physical Exam Updated Vital Signs BP 135/86   Pulse 99   Temp 97.7 F (36.5 C) (Oral)   Resp 15   Ht 5\' 3"  (1.6 m)   Wt 108.9 kg   SpO2 95%   BMI 42.53 kg/m  Physical Exam Vitals and nursing note reviewed.  Constitutional:      Appearance: She is well-developed.  HENT:     Head: Normocephalic.     Mouth/Throat:     Mouth: Mucous membranes are moist.  Eyes:     Pupils: Pupils are equal, round, and reactive to light.  Cardiovascular:     Rate and Rhythm: Normal rate.  Pulmonary:     Effort: Pulmonary effort is normal.  Abdominal:     General: There is no distension.  Musculoskeletal:        General: Normal range of motion.     Cervical back: Normal range of motion.     Comments: Tender right lower lumbar area.  Full range of motion lower extremities.  Skin:    General: Skin is warm.  Neurological:     General: No focal deficit present.     Mental Status: She is alert and oriented to person, place, and time.  Psychiatric:        Mood and Affect: Mood normal.     ED Results / Procedures / Treatments   Labs (all labs ordered are listed, but only abnormal results are displayed) Labs Reviewed  CBC WITH DIFFERENTIAL/PLATELET - Abnormal; Notable for the following components:      Result Value   HCT 46.6 (*)    Platelets 122 (*)    All other components within normal limits  BASIC METABOLIC PANEL - Abnormal; Notable for the following components:   Potassium 5.6 (*)    Glucose, Bld 111 (*)    Anion gap 16 (*)    All other components within normal limits    EKG None  Radiology CT L-SPINE NO CHARGE  Result Date: 08/31/2022 CLINICAL DATA:  Pain EXAM: CT LUMBAR SPINE WITHOUT CONTRAST TECHNIQUE: Multidetector CT imaging of the lumbar spine was performed without  intravenous contrast administration. Multiplanar CT image reconstructions were also generated. RADIATION DOSE REDUCTION: This exam was performed according to the departmental dose-optimization program which includes automated exposure control, adjustment of the mA and/or kV according to patient size and/or use of iterative reconstruction technique. COMPARISON:  CT 04/27/2022. FINDINGS: Segmentation: 5 lumbar type vertebrae. Alignment: Normal Vertebrae: Moderate chronic compression fractures involving L2, L3 and L4, stable since previous study. No acute fracture. Paraspinal and other soft tissues: No paraspinal abnormality. Disc levels: Degenerative facet disease diffusely, most pronounced in the  lower lumbar spine. Mild degenerative disc disease with anterior spurring. IMPRESSION: Moderate chronic compression fractures from L2-L4, unchanged since prior study. No acute fracture. Degenerative disc and facet disease. Electronically Signed   By: Charlett Nose M.D.   On: 08/31/2022 20:01   CT ABDOMEN PELVIS W CONTRAST  Result Date: 08/31/2022 CLINICAL DATA:  Abdominal/flank pain, stone suspected EXAM: CT ABDOMEN AND PELVIS WITH CONTRAST TECHNIQUE: Multidetector CT imaging of the abdomen and pelvis was performed using the standard protocol following bolus administration of intravenous contrast. RADIATION DOSE REDUCTION: This exam was performed according to the departmental dose-optimization program which includes automated exposure control, adjustment of the mA and/or kV according to patient size and/or use of iterative reconstruction technique. CONTRAST:  OMNIPAQUE IOHEXOL 350 MG/ML SOLN COMPARISON:  04/27/2022 FINDINGS: Lower chest: Bibasilar atelectasis. Pacer wires in the right heart. No acute abnormality. Hepatobiliary: Layering gallstones within the gallbladder. No focal hepatic abnormality or biliary ductal dilatation. Pancreas: No focal abnormality or ductal dilatation. Spleen: No focal abnormality.   Normal size. Adrenals/Urinary Tract: 6 mm nonobstructing right lower pole renal stone. No ureteral stones or hydronephrosis. Adrenal glands and urinary bladder unremarkable. Stomach/Bowel: Stomach, large and small bowel grossly unremarkable. Normal appendix. Vascular/Lymphatic: Aortic atherosclerosis. No evidence of aneurysm or adenopathy. Left common iliac vein stent noted. Reproductive: Uterus and left adnexa unremarkable. Right ovarian cyst measures 4.5 cm, stable. Other: No free fluid or free air. Musculoskeletal: Moderate compression fractures involving L2, L3 and L4 are stable since prior study. No acute bony abnormality. IMPRESSION: Cholelithiasis.  No CT evidence of acute cholecystitis. Aortic atherosclerosis. Right lower pole nephrolithiasis.  No hydronephrosis. Chronic L2-L4 compression fractures, stable. 4.5 cm right ovarian cyst compared to 5.5 cm previously. Electronically Signed   By: Charlett Nose M.D.   On: 08/31/2022 19:58    Procedures Procedures    Medications Ordered in ED Medications  oxyCODONE-acetaminophen (PERCOCET/ROXICET) 5-325 MG per tablet 1 tablet (1 tablet Oral Given 08/31/22 1349)  iohexol (OMNIPAQUE) 350 MG/ML injection 100 mL (100 mLs Intravenous Contrast Given 08/31/22 1946)    ED Course/ Medical Decision Making/ A&P                                 Medical Decision Making Patient complains of pain in her low back after bending over 2 days ago  Amount and/or Complexity of Data Reviewed Independent Historian:     Details: Patient is here with her daughter who she lives with External Data Reviewed: notes.    Details: Primary care and cardiology notes reviewed Labs: ordered. Decision-making details documented in ED Course.    Details: Labs ordered reviewed and interpreted Radiology: ordered and independent interpretation performed. Decision-making details documented in ED Course.    Details: CT abdomen and pelvis with LS-spine.  Patient has chronic L2-L4  compression fractures.  No acute changes   Risk Prescription drug management. Risk Details: I suspect patient's pain is secondary to chronic compression fractures of her lumbar spine.  Patient had minimal relief with Percocet.  I will treat her with hydrocodone.  She is advised to watch sedation.  Patient feels like she is able to go home she is able to ambulate and transfer to her wheelchair.  Patient counseled on the need to follow-up with her primary care physician.           Final Clinical Impression(s) / ED Diagnoses Final diagnoses:  Acute low back pain without sciatica, unspecified  back pain laterality  Closed compression fracture of L2 lumbar vertebra, sequela  Compression fracture of L3 vertebra, sequela    Rx / DC Orders ED Discharge Orders          Ordered    HYDROcodone-acetaminophen (NORCO/VICODIN) 5-325 MG tablet  Every 4 hours PRN        08/31/22 2058          An After Visit Summary was printed and given to the patient.     Elson Areas, [place] 08/31/22 2059    Margarita Grizzle, MD 09/03/22 1045

## 2022-08-31 NOTE — ED Notes (Signed)
Patient transported to CT 

## 2022-09-17 ENCOUNTER — Ambulatory Visit (INDEPENDENT_AMBULATORY_CARE_PROVIDER_SITE_OTHER): Payer: Medicare HMO | Admitting: Podiatry

## 2022-09-17 DIAGNOSIS — M79674 Pain in right toe(s): Secondary | ICD-10-CM | POA: Diagnosis not present

## 2022-09-17 DIAGNOSIS — M79675 Pain in left toe(s): Secondary | ICD-10-CM

## 2022-09-17 DIAGNOSIS — B351 Tinea unguium: Secondary | ICD-10-CM

## 2022-09-17 NOTE — Progress Notes (Signed)
Subjective:  Patient ID: [name], female    DOB: 10/29/1950,  MRN: [id number]   [name] presents to clinic today for:  Chief Complaint  Patient presents with   Nail Problem    Pt presents for dfc.   Patient notes nails are thick, discolored, elongated and painful in shoegear when trying to ambulate.    PCP is Elie Confer, NP.  Due to see PCP on 09/20/22 for f/u of back pain.  States her blood sugar has been well-controlled.  Past Medical History:  Diagnosis Date   Abnormal LFTs    AICD (automatic cardioverter/defibrillator) present    Angina decubitus 11/06/2012   Angina pectoris (HCC)    Anxiety    Arthritis    Back pain    Bundle branch block 05/2016   CAD (coronary artery disease) 06/18/2010   CHF (congestive heart failure) (HCC)    Chronic combined systolic and diastolic CHF, NYHA class 3 (HCC)    Chronic combined systolic and diastolic heart failure (HCC)    Chronic systolic dysfunction of left ventricle 09/24/2012   Coronary artery disease    s/p CABG 2007   Depression    Dermal mycosis    Diabetes mellitus    type II   DKA (diabetic ketoacidoses) 01/12/2019   DVT (deep venous thrombosis) (HCC) 09/2015   bilateral   Edema, lower extremity    Glaucoma    Hyperlipidemia    Hypertension    Hypoglycemia 09/09/2012   Insomnia    Insulin dependent diabetes mellitus (HCC) 04/23/2012   Ischemic cardiomyopathy    EF 30-35%, s/p ICD 4/08 by Dr Amil Amen   Joint pain    LBBB (left bundle branch block) 09/24/2012   Morbid obesity (HCC) 11/06/2012   Obesity    Oxygen dependent 2 L   Pain in left knee    Peripheral neuropathy    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   LIMA to D2, SVG to ramus intermediate, sequential SVG to OM1-OM2, SVG to RCA with EVH via both legs    Sleep apnea    uses O2 at night   Tinea    Type 2 diabetes mellitus with hyperglycemia, with long-term current use of insulin (HCC) 04/20/2019   Uncontrolled diabetes mellitus 02/2019    Ventricular tachycardia (HCC) 01/16/15   sustained VT terminated with ATP, CL 340 msec   Vitamin B 12 deficiency 11/05/2018   Vitamin D deficiency 11/05/2018    Past Surgical History:  Procedure Laterality Date   BI-VENTRICULAR IMPLANTABLE CARDIOVERTER DEFIBRILLATOR UPGRADE N/A 09/25/2012   Procedure: BI-VENTRICULAR IMPLANTABLE CARDIOVERTER DEFIBRILLATOR UPGRADE;  Surgeon: Gardiner Rhyme, MD;  Location: [facility] CATH LAB;  Service: Cardiovascular;  Laterality: N/A;   BREAST BIOPSY Right 04/05/2014   CARDIAC CATHETERIZATION     CARDIAC DEFIBRILLATOR PLACEMENT  4/08   by Dr Amil Amen (MDT)   COLONOSCOPY WITH PROPOFOL N/A 10/12/2016   Procedure: COLONOSCOPY WITH PROPOFOL;  Surgeon: Sherrilyn Rist, MD;  Location: [facility] ENDOSCOPY;  Service: Gastroenterology;  Laterality: N/A;   CORONARY ARTERY BYPASS GRAFT  10/05/2005   by Dr Laneta Simmers   IMPLANTABLE CARDIOVERTER DEFIBRILLATOR IMPLANT  09/25/12   attempt of upgrade to CRT-D unsuccessful due to CS anatomy, SJM Unify Asaura device placed with LV port capped by Dr Johney Frame   IR GENERIC HISTORICAL  10/03/2015   IR US GUIDE VASC ACCESS LEFT 10/03/2015 Malachy Moan, MD [facility]   IR GENERIC HISTORICAL  10/03/2015   IR VENO/EXT/UNI  LEFT 10/03/2015 Malachy Moan, MD [facility]   IR GENERIC HISTORICAL  10/03/2015   IR VENOCAVAGRAM IVC 10/03/2015 Malachy Moan, MD [facility]   IR GENERIC HISTORICAL  10/03/2015   IR INFUSION THROMBOL VENOUS INITIAL (MS) 10/03/2015 Malachy Moan, MD [facility]   IR GENERIC HISTORICAL  10/03/2015   IR US GUIDE VASC ACCESS LEFT 10/03/2015 Malachy Moan, MD [facility]   IR GENERIC HISTORICAL  10/04/2015   IR THROMBECT VENO [facility] MOD SED 10/04/2015 Simonne Come, MD [facility]   IR GENERIC HISTORICAL  10/04/2015   IR TRANSCATH PLC STENT 1ST ART NOT LE CV CAR VERT CAR 10/04/2015 Simonne Come, MD [facility]   IR GENERIC HISTORICAL  10/04/2015   IR THROMB F/U EVAL ART/VEN FINAL DAY (MS) 10/04/2015 Simonne Come, MD [facility]   IR GENERIC HISTORICAL  11/02/2015   IR RADIOLOGIST EVAL & MGMT 11/02/2015 Simonne Come, MD [facility]   IR RADIOLOGIST EVAL & MGMT  04/26/2016   LEFT HEART CATH AND CORS/GRAFTS ANGIOGRAPHY N/A 01/19/2019   Procedure: LEFT HEART CATH AND CORS/GRAFTS ANGIOGRAPHY;  Surgeon: Lyn Records, MD;  Location: [facility] CV LAB;  Service: Cardiovascular;  Laterality: N/A;   LEFT HEART CATH AND CORS/GRAFTS ANGIOGRAPHY N/A 01/15/2022   Procedure: LEFT HEART CATH AND CORS/GRAFTS ANGIOGRAPHY;  Surgeon: Runell Gess, MD;  Location: [facility]VAS[facility] CV LAB;  Service: Cardiovascular;  Laterality: N/A;    Allergies  Allergen Reactions   Metformin And Related Itching, Swelling and Other (See Comments)    Leg pain & swelling in legs   Ozempic (0.25 Or 0.5 Mg-Dose) [Semaglutide(0.25 Or 0.5mg -Dos)] Other (See Comments)    Pt with hx of pancreatitis    Atorvastatin Calcium Itching and Rash   Levofloxacin Itching    Review of Systems: Negative except as noted in the HPI.  Objective:  There were no vitals filed for this visit.  Jocilynn Skilynn Moist is a pleasant 72 y.o. female in NAD. AAO x 3.  Vascular Examination: Capillary refill time is 3-5 seconds to toes bilateral. Palpable pedal pulses b/l LE. Digital hair present b/l.  Skin temperature gradient WNL b/l. No varicosities b/l. No cyanosis noted b/l.  +! Pitting edema in ankles/feet.  Dermatological Examination: Pedal skin with normal turgor, texture and tone b/l. No open wounds. No interdigital macerations b/l. Toenails x10 are 3mm thick, discolored, dystrophic with subungual debris. There is pain with compression of the nail plates.  They are elongated x10      Latest Ref Rng & Units 05/07/2022    8:31 AM 10/29/2021   11:05 PM  Hemoglobin A1C  Hemoglobin-A1c 4.0 - 5.6 % 7.4  7.7    Assessment/Plan: 1. Pain due to onychomycosis of toenails of both feet    The mycotic toenails were sharply debrided x10 with sterile nail nippers and a power  debriding burr to decrease bulk/thickness and length.    Return for she should already be schedule with Dr. Reece Agar in December for [facility].   Clerance Lav, DPM, FACFAS [facility]     [address] [place], [place] [address]                Office [phone]  Fax [phone]

## 2022-09-22 ENCOUNTER — Other Ambulatory Visit: Payer: Self-pay | Admitting: Internal Medicine

## 2022-09-25 ENCOUNTER — Other Ambulatory Visit: Payer: Self-pay

## 2022-10-01 ENCOUNTER — Ambulatory Visit: Payer: Medicare HMO | Attending: Cardiology

## 2022-10-01 DIAGNOSIS — Z95810 Presence of automatic (implantable) cardiac defibrillator: Secondary | ICD-10-CM | POA: Diagnosis not present

## 2022-10-01 DIAGNOSIS — I5042 Chronic combined systolic (congestive) and diastolic (congestive) heart failure: Secondary | ICD-10-CM

## 2022-10-09 NOTE — Progress Notes (Signed)
EPIC Encounter for ICM Monitoring  Patient Name: [name] is a 72 y.o. female Date: 10/09/2022 Primary Care Physican: Elie Confer, NP Primary Cardiologist: Skains/Weaver PA  Electrophysiologist: Lalla Brothers 10/04/2021 Weight: 237 lbs 11/06/2021 Weight: 232 lbs 03/05/2022 Weight: 235 lbs 04/16/2022 Weight: 230 lbs 05/15/2022 Weight:  230 lbs 08/29/2022 Weight: 230 lbs   AT/AF Burden: 0% (taking Eliquis)   Battery Longevity: 3.2 months                                                          Transmission results reviewed.      CorVue thoracic impedance suggesting normal fluid levels within the last month.      Prescribed: No diuretic since 04/2022 hospital discharge   Labs: 06/05/2022 Creatinine 1.01, BUN 17, Potassium 4.1, Sodium 141, GFR 59 05/01/2022 Creatinine 1.19, BUN 24, Potassium 4.8, Sodium 134, GFR 49  04/30/2022 Creatinine 1.43, BUN 19, Potassium 4.7, Sodium 137, GFR 39  04/29/2022 Creatinine 1.23, BUN 18, Potassium 4.6, Sodium 135, GFR 47  04/28/2022 Creatinine 1.33, BUN 21, Potassium 3.3, Sodium 137, GFR 43 04/27/2022 Creatinine 1.42, BUN 24, Potassium 5.3, Sodium 140, GFR 39  A complete set of results can be found in Results Review.   Recommendations:   No changes.   Next ICM clinic phone appointment due:  11/05/2022.     Next 91 day remote transmission due: 10/22/2022.     EP/Cardiology Office Visits:  Recall 02/04/2023 with Dr Lalla Brothers.  12/12/2022 with Dr Anne Fu.   Copy of ICM check sent to Dr. Lalla Brothers.  3 month ICM trend: 10/08/2022.    12-14 Month ICM trend:     Karie Soda, RN 10/09/2022 9:30 AM

## 2022-10-12 ENCOUNTER — Other Ambulatory Visit (HOSPITAL_COMMUNITY): Payer: Self-pay | Admitting: Orthopedic Surgery

## 2022-10-12 DIAGNOSIS — M5459 Other low back pain: Secondary | ICD-10-CM

## 2022-10-22 ENCOUNTER — Ambulatory Visit (INDEPENDENT_AMBULATORY_CARE_PROVIDER_SITE_OTHER): Payer: Medicare HMO

## 2022-10-22 ENCOUNTER — Other Ambulatory Visit: Payer: Self-pay | Admitting: Orthopedic Surgery

## 2022-10-22 DIAGNOSIS — M4856XA Collapsed vertebra, not elsewhere classified, lumbar region, initial encounter for fracture: Secondary | ICD-10-CM

## 2022-10-22 DIAGNOSIS — I255 Ischemic cardiomyopathy: Secondary | ICD-10-CM

## 2022-10-22 DIAGNOSIS — I5042 Chronic combined systolic (congestive) and diastolic (congestive) heart failure: Secondary | ICD-10-CM | POA: Diagnosis not present

## 2022-10-24 LAB — CUP PACEART REMOTE DEVICE CHECK
Battery Remaining Longevity: 3 mo
Battery Remaining Percentage: 3 %
Battery Voltage: 2.6 V
Brady Statistic AP VP Percent: 3.6 %
Brady Statistic AP VS Percent: 33 %
Brady Statistic AS VP Percent: 1 %
Brady Statistic AS VS Percent: 59 %
Brady Statistic RA Percent Paced: 30 %
Brady Statistic RV Percent Paced: 4 %
Date Time Interrogation Session: 20241014065911
HighPow Impedance: 41 Ohm
HighPow Impedance: 41 Ohm
Implantable Lead Connection Status: 753985
Implantable Lead Connection Status: 753985
Implantable Lead Connection Status: 753985
Implantable Lead Implant Date: 20080423
Implantable Lead Implant Date: 20080423
Implantable Lead Implant Date: 20140918
Implantable Lead Location: 753858
Implantable Lead Location: 753859
Implantable Lead Location: 753860
Implantable Lead Model: 5076
Implantable Lead Model: 6947
Implantable Pulse Generator Implant Date: 20140918
Lead Channel Impedance Value: 400 Ohm
Lead Channel Impedance Value: 480 Ohm
Lead Channel Pacing Threshold Amplitude: 0.5 V
Lead Channel Pacing Threshold Amplitude: 0.75 V
Lead Channel Pacing Threshold Pulse Width: 0.5 ms
Lead Channel Pacing Threshold Pulse Width: 0.5 ms
Lead Channel Sensing Intrinsic Amplitude: 1.4 mV
Lead Channel Sensing Intrinsic Amplitude: 11.5 mV
Lead Channel Setting Pacing Amplitude: 2 V
Lead Channel Setting Pacing Amplitude: 2.5 V
Lead Channel Setting Pacing Pulse Width: 0.5 ms
Lead Channel Setting Sensing Sensitivity: 0.5 mV
Pulse Gen Serial Number: 7070007

## 2022-11-02 ENCOUNTER — Ambulatory Visit
Admission: RE | Admit: 2022-11-02 | Discharge: 2022-11-02 | Disposition: A | Discharge status: Home / Self Care | Payer: Medicare HMO | Source: Ambulatory Visit | Attending: Orthopedic Surgery | Admitting: Orthopedic Surgery

## 2022-11-02 ENCOUNTER — Telehealth: Payer: Self-pay

## 2022-11-02 DIAGNOSIS — M4856XA Collapsed vertebra, not elsewhere classified, lumbar region, initial encounter for fracture: Secondary | ICD-10-CM

## 2022-11-02 NOTE — Telephone Encounter (Signed)
The patient states her chest is shaking and trembling. I asked her did it feel like a vibration. I had her send in a manual transmission and it was received. The pt reached ERI as of 11/01/2022. I told her I will let Dr. Lalla Brothers nurse know and send a note to the scheduler to get the gen change scheduled. The patient thanked me for my help.

## 2022-11-02 NOTE — Telephone Encounter (Signed)
Called pt and she is scheduled to see Canary Brim, NP on 11/13 - Her ICD Gen change with Dr. Lalla Brothers is scheduled on 12/3 at 1:30. She will get labs, scrub and Instruction letter when she's here on 11/13.

## 2022-11-02 NOTE — Telephone Encounter (Signed)
error 

## 2022-11-05 ENCOUNTER — Ambulatory Visit: Payer: Medicare HMO | Attending: Cardiology

## 2022-11-05 DIAGNOSIS — Z95810 Presence of automatic (implantable) cardiac defibrillator: Secondary | ICD-10-CM | POA: Diagnosis not present

## 2022-11-05 DIAGNOSIS — I5042 Chronic combined systolic (congestive) and diastolic (congestive) heart failure: Secondary | ICD-10-CM | POA: Diagnosis not present

## 2022-11-05 NOTE — Progress Notes (Signed)
EPIC Encounter for ICM Monitoring  Patient Name: [name] is a 72 y.o. female Date: 11/05/2022 Primary Care Physican: Elie Confer, NP Primary Cardiologist: Skains/Weaver PA  Electrophysiologist: Lalla Brothers 10/04/2021 Weight: 237 lbs 11/06/2021 Weight: 232 lbs 03/05/2022 Weight: 235 lbs 04/16/2022 Weight: 230 lbs 05/15/2022 Weight:  230 lbs 08/29/2022 Weight: 230 lbs   AT/AF Burden: 0% (taking Eliquis)   Battery Longevity: ERI reached 10/24.  ICD battery replacement scheduled for 12/3.                                                          Attempted call to patient and unable to reach.  Left detailed message per DPR regarding transmission. Transmission reviewed.     CorVue thoracic impedance suggesting normal fluid levels within the last month.      Prescribed: No diuretic since 04/2022 hospital discharge   Labs: 08/31/2022 Creatinine 0.82, BUN 14, Potassium 5.6, Sodium 138, GFR >60 06/05/2022 Creatinine 1.01, BUN 17, Potassium 4.1, Sodium 141, GFR 59 05/01/2022 Creatinine 1.19, BUN 24, Potassium 4.8, Sodium 134, GFR 49  04/30/2022 Creatinine 1.43, BUN 19, Potassium 4.7, Sodium 137, GFR 39  04/29/2022 Creatinine 1.23, BUN 18, Potassium 4.6, Sodium 135, GFR 47  04/28/2022 Creatinine 1.33, BUN 21, Potassium 3.3, Sodium 137, GFR 43 04/27/2022 Creatinine 1.42, BUN 24, Potassium 5.3, Sodium 140, GFR 39  A complete set of results can be found in Results Review.   Recommendations:   No changes and encouraged to call if experiencing any fluid symptoms.   Next ICM clinic phone appointment due:  12/10/2022.     Next 91 day remote transmission due: pending battery replacement.     EP/Cardiology Office Visits:  11/21/2022 with Canary Brim, NP.   Recall 02/04/2023 with Dr Lalla Brothers.  12/12/2022 with Dr Anne Fu.   Copy of ICM check sent to Dr. Lalla Brothers.  3 month ICM trend: 11/05/2022.    12-14 Month ICM trend:     Karie Soda, RN 11/05/2022 3:32 PM

## 2022-11-08 ENCOUNTER — Encounter: Payer: Self-pay | Admitting: Internal Medicine

## 2022-11-08 ENCOUNTER — Ambulatory Visit: Payer: Medicare HMO | Admitting: Internal Medicine

## 2022-11-08 VITALS — BP 124/82 | HR 61 | Ht 63.0 in | Wt 235.0 lb

## 2022-11-08 DIAGNOSIS — Z23 Encounter for immunization: Secondary | ICD-10-CM

## 2022-11-08 DIAGNOSIS — E1165 Type 2 diabetes mellitus with hyperglycemia: Secondary | ICD-10-CM | POA: Diagnosis not present

## 2022-11-08 DIAGNOSIS — Z794 Long term (current) use of insulin: Secondary | ICD-10-CM

## 2022-11-08 LAB — POCT GLYCOSYLATED HEMOGLOBIN (HGB A1C): Hemoglobin A1C: 7 % — AB (ref 4.0–5.6)

## 2022-11-08 MED ORDER — TRESIBA FLEXTOUCH 100 UNIT/ML ~~LOC~~ SOPN: 30.0000 [IU] | PEN_INJECTOR | Freq: Every day | SUBCUTANEOUS | 3 refills | Status: DC

## 2022-11-08 MED ORDER — DAPAGLIFLOZIN PROPANEDIOL 10 MG PO TABS: 10.0000 mg | ORAL_TABLET | Freq: Every day | ORAL | 3 refills | Status: DC

## 2022-11-08 MED ORDER — NOVOLOG FLEXPEN 100 UNIT/ML ~~LOC~~ SOPN: PEN_INJECTOR | SUBCUTANEOUS | 3 refills | Status: DC

## 2022-11-08 NOTE — Progress Notes (Signed)
Remote ICD transmission.   

## 2022-11-08 NOTE — Patient Instructions (Addendum)
-   Continue  Tresiba 30 units ONCE  Daily -Continue Farxiga 10 mg, 1 tablet with Breakfast  -Novolog correctional insulin: Use the scale below to help guide you BEFORE you eat   Blood sugar before meal Number of units to inject  Less than 175 0 unit  176 -  220 1 units  221-  265 2 units  266 - 310 3 units  311 - 355 4 units  356 - 400 5 units     HOW TO TREAT LOW BLOOD SUGARS (Blood sugar LESS THAN 70 MG/DL) Please follow the RULE OF 15 for the treatment of hypoglycemia treatment (when your (blood sugars are less than 70 mg/dL)   STEP 1: Take 15 grams of carbohydrates when your blood sugar is low, which includes:  3-4 GLUCOSE TABS  OR 3-4 OZ OF JUICE OR REGULAR SODA OR ONE TUBE OF GLUCOSE GEL    STEP 2: RECHECK blood sugar in 15 MINUTES STEP 3: If your blood sugar is still low at the 15 minute recheck --> then, go back to STEP 1 and treat AGAIN with another 15 grams of carbohydrates.

## 2022-11-08 NOTE — Progress Notes (Signed)
Name: [name]  Age/ Sex: 72 y.o., female   MRN/ DOB: [id number], [birth year]     PCP: Elie Confer, NP   Reason for Endocrinology Evaluation: Type 2 Diabetes Mellitus  Initial Endocrine Consultative Visit: 04/20/2019    PATIENT IDENTIFIER: [name] is a 72 y.o. female with a past medical history of DM, HTN, Dyslipidemia , CAD and hx of pancreatitis.. The patient has followed with Endocrinology clinic since 04/20/2019 for consultative assistance with management of her diabetes.     DIABETIC HISTORY:  [name] was diagnosed with T2DM many years ago, she is intolerant to Metformin, has been on Glipizide without reported intolerance. Her hemoglobin A1c has ranged from 7.8% in 2017, peaking at 9.5% in 2014.  On her initial visit to our clinic she had an A1c of 7.7%  , she was on MDI regimen and Ozempic. Marland Kitchen We switched levemir to tresiba and continued Ozempic and Novolog.    farxiga started 11/2019 Ozempic stopped 12/2020 due to pancreatitis on CT imaging.     SUBJECTIVE:   During the last visit (05/07/2022): A1c 7.4%  Today (11/08/2022): [name] is here for a follow up on diabetes. She is accompanied by her son [name].  She checks her blood sugar through the CGM The patient has had  hypoglycemic episodes since the last clinic visit, which typically occur 1 x /month  - most often occuring during the day. The patient is  symptomatic with these episodes.    She continues to follow-up with cardiology for CHF and CAD  She was evaluated by podiatry on 09/17/2022 She continues to follow-up with gynecology for adnexal mass and endometrial thickening Follows with Ortho for back pain , entertaining back surgery   Denies nausea or vomiting  Denies constipation or diarrhea diarrhea    HOME DIABETES REGIMEN:  Tresiba  30 units ONCE  Daily  Farxiga 10 mg daily  CF: Novolog (BG-130/20)     Statin: Yes ACE-I/ARB: No    CONTINUOUS GLUCOSE MONITORING RECORD  INTERPRETATION    Dates of Recording: 10/18-10/31/2024  Sensor description:dexcom   Results statistics:   CGM use % of time 93  Average and SD 148/39  Time in range  80 %  % Time Above 180 19  % Time above 250 1  % Time Below target 0   Glycemic patterns summary: Bg's optimal at night and fluctuate during the day  Hyperglycemic episodes  postprandial   Hypoglycemic episodes occurred during the day  Overnight periods: stable and optimal    DIABETIC COMPLICATIONS: Microvascular complications:  Neuropathy Denies: CKD, retinopathy  Last Eye Exam: Completed 2024  Macrovascular complications:  CAD, CHF Denies: CVA, PVD   HISTORY:  Past Medical History:  Past Medical History:  Diagnosis Date   Abnormal LFTs    AICD (automatic cardioverter/defibrillator) present    Angina decubitus (HCC) 11/06/2012   Angina pectoris (HCC)    Anxiety    Arthritis    Back pain    Bundle branch block 05/2016   CAD (coronary artery disease) 06/18/2010   CHF (congestive heart failure) (HCC)    Chronic combined systolic and diastolic CHF, NYHA class 3 (HCC)    Chronic combined systolic and diastolic heart failure (HCC)    Chronic systolic dysfunction of left ventricle 09/24/2012   Coronary artery disease    s/p CABG 2007   Depression    Dermal mycosis    Diabetes mellitus    type II   DKA (diabetic  ketoacidoses) 01/12/2019   DVT (deep venous thrombosis) (HCC) 09/2015   bilateral   Edema, lower extremity    Glaucoma    Hyperlipidemia    Hypertension    Hypoglycemia 09/09/2012   Insomnia    Insulin dependent diabetes mellitus (HCC) 04/23/2012   Ischemic cardiomyopathy    EF 30-35%, s/p ICD 4/08 by Dr Amil Amen   Joint pain    LBBB (left bundle branch block) 09/24/2012   Morbid obesity (HCC) 11/06/2012   Obesity    Oxygen dependent 2 L   Pain in left knee    Peripheral neuropathy    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   LIMA to D2, SVG to ramus intermediate, sequential SVG to OM1-OM2, SVG to RCA  with EVH via both legs    Sleep apnea    uses O2 at night   Tinea    Type 2 diabetes mellitus with hyperglycemia, with long-term current use of insulin (HCC) 04/20/2019   Uncontrolled diabetes mellitus 02/2019   Ventricular tachycardia (HCC) 01/16/15   sustained VT terminated with ATP, CL 340 msec   Vitamin B 12 deficiency 11/05/2018   Vitamin D deficiency 11/05/2018   Past Surgical History:  Past Surgical History:  Procedure Laterality Date   BI-VENTRICULAR IMPLANTABLE CARDIOVERTER DEFIBRILLATOR UPGRADE N/A 09/25/2012   Procedure: BI-VENTRICULAR IMPLANTABLE CARDIOVERTER DEFIBRILLATOR UPGRADE;  Surgeon: Gardiner Rhyme, MD;  Location[facility] CATH LAB;  Service: Cardiovascular;  Laterality: N/A;   BREAST BIOPSY Right 04/05/2014   CARDIAC CATHETERIZATION     CARDIAC DEFIBRILLATOR PLACEMENT  4/08   by Dr Amil Amen (MDT)   COLONOSCOPY WITH PROPOFOL N/A 10/12/2016   Procedure: COLONOSCOPY WITH PROPOFOL;  Surgeon: Sherrilyn Rist, MD;  Location: [facility] ENDOSCOPY;  Service: Gastroenterology;  Laterality: N/A;   CORONARY ARTERY BYPASS GRAFT  10/05/2005   by Dr Laneta Simmers   IMPLANTABLE CARDIOVERTER DEFIBRILLATOR IMPLANT  09/25/12   attempt of upgrade to CRT-D unsuccessful due to CS anatomy, SJM Unify Asaura device placed with LV port capped by Dr Johney Frame   IR GENERIC HISTORICAL  10/03/2015   IR US GUIDE VASC ACCESS LEFT 10/03/2015 Malachy Moan, MD [facility]   IR GENERIC HISTORICAL  10/03/2015   IR VENO/EXT/UNI LEFT 10/03/2015 Malachy Moan, MD [facility]   IR GENERIC HISTORICAL  10/03/2015   IR VENOCAVAGRAM IVC 10/03/2015 Malachy Moan, MD [facility]   IR GENERIC HISTORICAL  10/03/2015   IR INFUSION THROMBOL VENOUS INITIAL (MS) 10/03/2015 Malachy Moan, MD [facility]   IR GENERIC HISTORICAL  10/03/2015   IR US GUIDE VASC ACCESS LEFT 10/03/2015 Malachy Moan, MD [facility]   IR GENERIC HISTORICAL  10/04/2015   IR THROMBECT VENO MECH MOD SED 10/04/2015 Simonne Come, MD [facility]   IR  GENERIC HISTORICAL  10/04/2015   IR TRANSCATH PLC STENT 1ST ART NOT LE CV CAR VERT CAR 10/04/2015 Simonne Come, MD [facility]   IR GENERIC HISTORICAL  10/04/2015   IR THROMB F/U EVAL ART/VEN FINAL DAY (MS) 10/04/2015 Simonne Come, MD [facility]   IR GENERIC HISTORICAL  11/02/2015   IR RADIOLOGIST EVAL & MGMT 11/02/2015 Simonne Come, MD [facility]   IR RADIOLOGIST EVAL & MGMT  04/26/2016   LEFT HEART CATH AND CORS/GRAFTS ANGIOGRAPHY N/A 01/19/2019   Procedure: LEFT HEART CATH AND CORS/GRAFTS ANGIOGRAPHY;  Surgeon: Lyn Records, MD;  Location: [facility] INVASIVE CV LAB;  Service: Cardiovascular;  Laterality: N/A;   LEFT HEART CATH AND CORS/GRAFTS ANGIOGRAPHY N/A 01/15/2022   Procedure: LEFT HEART CATH  AND CORS/GRAFTS ANGIOGRAPHY;  Surgeon: Runell Gess, MD;  Location: [facility] INVASIVE CV LAB;  Service: Cardiovascular;  Laterality: N/A;   Social History:  reports that she has never smoked. She has never used smokeless tobacco. She reports that she does not drink alcohol and does not use drugs. Family History:  Family History  Problem Relation Age of Onset   Diabetes Mother 63       died - HTN   Stroke Mother    High blood pressure Mother    Sudden death Mother    Obesity Mother    Other Other        No early family hx of CAD     HOME MEDICATIONS: Allergies as of 11/08/2022       Reactions   Metformin And Related Itching, Swelling, Other (See Comments)   Leg pain & swelling in legs   Ozempic (0.25 Or 0.5 Mg-dose) [semaglutide(0.25 Or 0.5mg -dos)] Other (See Comments)   Pt with hx of pancreatitis    Atorvastatin Calcium Itching, Rash   Levofloxacin Itching        Medication List        Accurate as of November 08, 2022  7:58 AM. If you have any questions, ask your nurse or doctor.          albuterol (2.5 MG/3ML) 0.083% nebulizer solution Commonly known as: PROVENTIL Take 2.5 mg by nebulization every 6 (six) hours as needed for wheezing or shortness of breath.   amiodarone 200  MG tablet Commonly known as: PACERONE Take 0.5 tablets (100 mg total) by mouth daily.   calcitonin (salmon) 200 UNIT/ACT nasal spray Commonly known as: MIACALCIN/FORTICAL SMARTSIG:1 Spray(s) Both Nares   carvedilol 12.5 MG tablet Commonly known as: COREG Take 12.5 mg by mouth 2 (two) times daily.   cyanocobalamin 1000 MCG/ML injection Commonly known as: VITAMIN B12 1,000 mcg every 30 (thirty) days.   cyclobenzaprine 5 MG tablet Commonly known as: FLEXERIL Take 5 mg by mouth 3 (three) times daily.   Dexcom G7 Sensor Misc 1 Device by Does not apply route as directed.   DropSafe Alcohol Prep 70 % Pads Apply topically.   Eliquis 5 MG Tabs tablet Generic drug: apixaban TAKE 1 TABLET TWICE DAILY   Entresto 24-26 MG Generic drug: sacubitril-valsartan Take 1 tablet by mouth 2 (two) times daily.   ezetimibe 10 MG tablet Commonly known as: ZETIA Take 10 mg by mouth daily.   Farxiga 10 MG Tabs tablet Generic drug: dapagliflozin propanediol TAKE 1 TABLET EVERY DAY   ferrous sulfate 325 (65 FE) MG EC tablet Take 325 mg by mouth every other day.   gabapentin 300 MG capsule Commonly known as: NEURONTIN Take 300 mg by mouth at bedtime.   HYDROcodone-acetaminophen 5-325 MG tablet Commonly known as: NORCO/VICODIN Take 1 tablet by mouth every 4 (four) hours as needed for moderate pain.   Insulin Pen Needle 31G X 5 MM Misc 1 Device by Does not apply route in the morning, at noon, in the evening, and at bedtime.   isosorbide mononitrate 30 MG 24 hr tablet Commonly known as: IMDUR Take 30 mg by mouth daily.   latanoprost 0.005 % ophthalmic solution Commonly known as: XALATAN Place 1 drop into both eyes at bedtime.   methocarbamol 500 MG tablet Commonly known as: ROBAXIN Take 500 mg by mouth every 8 (eight) hours as needed for muscle spasms.   naproxen 500 MG tablet Commonly known as: NAPROSYN Take 500 mg by mouth 2 (two) times  daily.   nitroGLYCERIN 0.4 MG SL  tablet Commonly known as: NITROSTAT Place 1 tablet (0.4 mg total) under the tongue every 5 (five) minutes as needed for chest pain.   NovoLOG FlexPen 100 UNIT/ML FlexPen Generic drug: insulin aspart INJECT PER SLIDING SCALE AS DIRECTED , MAX 30 UNITS PER DAY. DISCARD PEN 28 DAYS AFTER OPENING What changed:  how much to take how to take this when to take this additional instructions   rosuvastatin 20 MG tablet Commonly known as: CRESTOR Take 1 tablet (20 mg total) by mouth daily at 6 PM.   Evaristo Bury FlexTouch 100 UNIT/ML FlexTouch Pen Generic drug: insulin degludec Inject 5 Units into the skin daily. What changed: how much to take   True Metrix Blood Glucose Test test strip Generic drug: glucose blood TEST THREE TIMES DAILY   Accu-Chek Guide test strip Generic drug: glucose blood Check 3 times daily   Vitamin D (Ergocalciferol) 1.25 MG (50000 UNIT) Caps capsule Commonly known as: DRISDOL Take 1 capsule (50,000 Units total) by mouth every 7 (seven) days.         OBJECTIVE:   Vital Signs: BP 124/82 (BP Location: Left Arm, Patient Position: Sitting, Cuff Size: Large)   Pulse 61   Ht 5\' 3"  (1.6 m)   Wt 235 lb (106.6 kg)   SpO2 92%   BMI 41.63 kg/m    Wt Readings from Last 3 Encounters:  11/08/22 235 lb (106.6 kg)  08/31/22 240 lb 1.3 oz (108.9 kg)  08/08/22 240 lb (108.9 kg)     Exam: General: Pt appears well and is in NAD  Lungs: Clear with good BS bilat  Heart: RRR  Extremities:  Trace pretibial edema.  Neuro: MS is good with appropriate affect, pt is alert and Ox3     DM Foot Exam: 09/17/2022 per podiatry     DATA REVIEWED:  Lab Results  Component Value Date   HGBA1C 7.0 (A) 11/08/2022   HGBA1C 7.4 (A) 05/07/2022   HGBA1C 7.7 (H) 10/29/2021    Latest Reference Range & Units 05/01/22 00:33  Sodium 135 - 145 mmol/L 134 (L)  Potassium 3.5 - 5.1 mmol/L 4.8  Chloride 98 - 111 mmol/L 99  CO2 22 - 32 mmol/L 25  Glucose 70 - 99 mg/dL 409 (H)  BUN  8 - 23 mg/dL 24 (H)  Creatinine 8.11 - 1.00 mg/dL 9.14 (H)  Calcium 8.9 - 10.3 mg/dL 9.9  Anion gap 5 - 15  10  GFR, Estimated >60 mL/min 49 (L)  (L): Data is abnormally low (H): Data is abnormally high   ASSESSMENT / PLAN / RECOMMENDATIONS:   1) Type 2 Diabetes Mellitus, 1optimally controlled, With Neuropathic and  macrovascular complications - Most recent A1c of 7.0  %. Goal A1c < 7.0 %.    -A1c at goal -She has been noted with hypoglycemia during the day, will adjust her sensitivity factor as below - Not a candidate for GLP1 agonist, DPP-4 inhibitors and mounjaro due to pancreatitis.   MEDICATIONS:  -Continue Tresiba 30 units ONCE  Daily  -Continue Farxiga 10 mg, 1 tablet with Breakfast  -Change Correction factor: Novolog ( BG-130/45)  EDUCATION / INSTRUCTIONS: BG monitoring instructions: Patient is instructed to check her blood sugars 3 times a day, before meals Call [facility] if: BG persistently < 70 I reviewed the Rule of 15 for the treatment of hypoglycemia in detail with the patient. Literature supplied.   2) Diabetic complications:  Eye: Does not have known  diabetic retinopathy.  Neuro/ Feet: Does  have known diabetic peripheral neuropathy based on foot exam from 07/16/2019 Renal: Patient does not have known baseline CKD. She is not on an ACEI/ARB at present.    F/U in 6 months   Signed electronically by: Lyndle Herrlich, MD  [facility] Endocrinology  [facility] [address] [place], [place] [address] Phone: [phone] FAX: [phone]   CC: Elie Confer, NP [address] [place] [address] Phone: [phone]  Fax: [phone]  Return to Endocrinology clinic as below: Future Appointments  Date Time Provider Department Center  11/08/2022  8:30 AM Nikitia Asbill, Konrad Dolores, MD [facility] None  11/21/2022  1:30 PM Jeanella Craze, NP [facility]  12/10/2022  7:20 AM  [facility] DEVICE REMOTES [facility]  12/12/2022  9:00 AM Jake Bathe, MD [facility]  12/19/2022  2:45 PM Freddie Breech, DPM [facility]  01/21/2023  9:40 AM [facility] DEVICE REMOTES [facility]

## 2022-11-12 ENCOUNTER — Other Ambulatory Visit: Payer: Self-pay | Admitting: Vascular Surgery

## 2022-11-12 ENCOUNTER — Other Ambulatory Visit: Payer: Self-pay | Admitting: Internal Medicine

## 2022-11-12 DIAGNOSIS — I739 Peripheral vascular disease, unspecified: Secondary | ICD-10-CM

## 2022-11-20 NOTE — Progress Notes (Unsigned)
Electrophysiology Office Note:   Date:  11/21/2022  ID:  [name], DOB 1950-04-04, MRN [id number]  Primary Cardiologist: Donato Schultz, MD Electrophysiologist: Lanier Prude, MD       History of Present Illness:   [name] is a 72 y.o. female with h/o ICM s/p ICD, VT/VF, LBBB, HFrEF,  seen today for routine electrophysiology followup.   Since last being seen in our clinic the patient reports she has been doing fairly well.  Reports no swelling, wears compression stockings, was taken off her lasix and has been doing well in regards to volume. She sleeps in a recliner at night but can lie flat. Her device is at [facility] as of 11/01/22 and is pending generator change. She has chronic back pain and is hoping to be cleared for a kyphoplasty today as well.   She denies chest pain, palpitations, dyspnea, PND, orthopnea, nausea, vomiting, dizziness, syncope, edema, weight gain, or early satiety.   Review of systems complete and found to be negative unless listed in HPI.   EP Information / Studies Reviewed:    EKG is ordered today. Personal review as below.  EKG Interpretation Date/Time:  Wednesday November 21 2022 13:33:27 EST Ventricular Rate:  63 PR Interval:  206 QRS Duration:  160 QT Interval:  516 QTC Calculation: 528 R Axis:   10  Text Interpretation: Sinus rhythm with occasional Premature ventricular complexes and Premature atrial complexes Non-specific intra-ventricular conduction block Confirmed by Canary Brim ([id number]) on 11/21/2022 2:18:42 PM   ICD Interrogation-  reviewed in detail today,  See PACEART report.  Device History: Abbott Dual Chamber ICD (actual CRT device but LV port is plugged) implanted 05/01/2006, LV lead attempted (unsuccessful, no suitable veins for LV lead) 09/25/2012 for ICM / HFrEF  History of appropriate therapy: Yes 01/2019 VF, 01/2022 VT History of AAD therapy: Yes; currently on amiodarone    Studies:  ECHO 07/2022 > LVEF 25-30%, LV has  severely decreased function, LV with global hypokinesis with septal-lateral dyssynchrony consistent with LBBB, G1DD, RV systolic function mildly reduced, LA mildly dilated   Risk Assessment/Calculations:    CHA2DS2-VASc Score = 6   This indicates a 9.7% annual risk of stroke. The patient's score is based upon: CHF History: 1 HTN History: 1 Diabetes History: 1 Stroke History: 0 Vascular Disease History: 1 Age Score: 1 Gender Score: 1             Physical Exam:   VS:  BP 130/84   Pulse 63   Ht 5\' 3"  (1.6 m)   Wt 237 lb 6.4 oz (107.7 kg)   SpO2 96%   BMI 42.05 kg/m    Wt Readings from Last 3 Encounters:  11/21/22 237 lb 6.4 oz (107.7 kg)  11/08/22 235 lb (106.6 kg)  08/31/22 240 lb 1.3 oz (108.9 kg)     GEN: Well nourished, well developed in no acute distress NECK: No JVD; No carotid bruits CARDIAC: Regular rate and rhythm, no murmurs, rubs, gallops RESPIRATORY:  Clear to auscultation without rales, wheezing or rhonchi  ABDOMEN: Soft, non-tender, non-distended EXTREMITIES:  No edema; No deformity   ASSESSMENT AND PLAN:    Chronic Combined Systolic & Diastolic Dysfunction s/p Abbott CRT-D  NYHA II-III  -euvolemic today -Stable on an appropriate medical regimen -Normal ICD function -See Pace Art report -No changes today -GDMT per Cardiology / continue Entresto  -at [facility] as of 11/01/22, will need to hold Eliquis 48h prior to generator replacement  -planned generator replacement  for 12/11/22 per Dr. Lalla Brothers  -reviewed procedure details, NPO after MN, OAC, and pre-procedure specific scrub  -assess CBC as well given OAC prior to procedure  High Risk Drug Monitoring  -continue amiodarone -repeat CMP, TSH, free T4    Back Pain  -pending kyphoplasty per Dr. Shon Baton. No surgical procedures for 6 weeks post generator change.    Disposition:   Follow up with Dr. Lalla Brothers  as planned for generator change    Signed, Canary Brim, MSN, APRN, NP-C, AGACNP-BC [facility] - Electrophysiology  11/21/2022, 2:31 PM

## 2022-11-21 ENCOUNTER — Encounter: Payer: Self-pay | Admitting: Pulmonary Disease

## 2022-11-21 ENCOUNTER — Ambulatory Visit: Payer: Medicare HMO | Attending: Pulmonary Disease | Admitting: Pulmonary Disease

## 2022-11-21 VITALS — BP 130/84 | HR 63 | Ht 63.0 in | Wt 237.4 lb

## 2022-11-21 DIAGNOSIS — I255 Ischemic cardiomyopathy: Secondary | ICD-10-CM

## 2022-11-21 DIAGNOSIS — I5042 Chronic combined systolic (congestive) and diastolic (congestive) heart failure: Secondary | ICD-10-CM | POA: Diagnosis not present

## 2022-11-21 DIAGNOSIS — Z95810 Presence of automatic (implantable) cardiac defibrillator: Secondary | ICD-10-CM | POA: Diagnosis not present

## 2022-11-21 DIAGNOSIS — I4901 Ventricular fibrillation: Secondary | ICD-10-CM | POA: Diagnosis not present

## 2022-11-21 LAB — CUP PACEART INCLINIC DEVICE CHECK
Battery Remaining Longevity: 0 mo
Brady Statistic RA Percent Paced: 26 %
Brady Statistic RV Percent Paced: 3.2 %
Date Time Interrogation Session: 20241113164037
HighPow Impedance: 41.6404
Implantable Lead Connection Status: 753985
Implantable Lead Connection Status: 753985
Implantable Lead Connection Status: 753985
Implantable Lead Implant Date: 20080423
Implantable Lead Implant Date: 20080423
Implantable Lead Implant Date: 20140918
Implantable Lead Location: 753858
Implantable Lead Location: 753859
Implantable Lead Location: 753860
Implantable Lead Model: 5076
Implantable Lead Model: 6947
Implantable Pulse Generator Implant Date: 20140918
Lead Channel Impedance Value: 437.5 Ohm
Lead Channel Impedance Value: 512.5 Ohm
Lead Channel Pacing Threshold Amplitude: 0.5 V
Lead Channel Pacing Threshold Amplitude: 0.5 V
Lead Channel Pacing Threshold Amplitude: 0.75 V
Lead Channel Pacing Threshold Amplitude: 0.75 V
Lead Channel Pacing Threshold Pulse Width: 0.5 ms
Lead Channel Pacing Threshold Pulse Width: 0.5 ms
Lead Channel Pacing Threshold Pulse Width: 0.5 ms
Lead Channel Pacing Threshold Pulse Width: 0.5 ms
Lead Channel Sensing Intrinsic Amplitude: 1.7 mV
Lead Channel Sensing Intrinsic Amplitude: 12 mV
Lead Channel Setting Pacing Amplitude: 2 V
Lead Channel Setting Pacing Amplitude: 2.5 V
Lead Channel Setting Pacing Pulse Width: 0.5 ms
Lead Channel Setting Sensing Sensitivity: 0.5 mV
Pulse Gen Serial Number: 7070007

## 2022-11-21 NOTE — Patient Instructions (Signed)
Medication Instructions:  Your physician recommends that you continue on your current medications as directed. Please refer to the Current Medication list given to you today.  *If you need a refill on your cardiac medications before your next appointment, please call your pharmacy*  Lab Work: CMET, TSH, FreeT4, CBC-TODAY If you have labs (blood work) drawn today and your tests are completely normal, you will receive your results only by: MyChart Message (if you have MyChart) OR A paper copy in the mail If you have any lab test that is abnormal or we need to change your treatment, we will call you to review the results.   Testing/Procedures: See letter  Follow-Up: At [facility], you and your health needs are our priority.  As part of our continuing mission to provide you with exceptional heart care, we have created designated Provider Care Teams.  These Care Teams include your primary Cardiologist (physician) and Advanced Practice Providers (APPs -  Physician Assistants and Nurse Practitioners) who all work together to provide you with the care you need, when you need it.  Your next appointment:   Your follow up appointments will be scheduled for you and print out on your discharge summary after your procedure,

## 2022-11-22 LAB — COMPREHENSIVE METABOLIC PANEL
ALT: 92 IU/L — ABNORMAL HIGH (ref 0–32)
AST: 60 IU/L — ABNORMAL HIGH (ref 0–40)
Albumin: 4.1 g/dL (ref 3.8–4.8)
Alkaline Phosphatase: 106 IU/L (ref 44–121)
BUN/Creatinine Ratio: 19 (ref 12–28)
BUN: 17 mg/dL (ref 8–27)
Bilirubin Total: 0.5 mg/dL (ref 0.0–1.2)
CO2: 18 mmol/L — ABNORMAL LOW (ref 20–29)
Calcium: 10 mg/dL (ref 8.7–10.3)
Chloride: 104 mmol/L (ref 96–106)
Creatinine, Ser: 0.88 mg/dL (ref 0.57–1.00)
Globulin, Total: 3.1 g/dL (ref 1.5–4.5)
Glucose: 141 mg/dL — ABNORMAL HIGH (ref 70–99)
Potassium: 4.1 mmol/L (ref 3.5–5.2)
Sodium: 140 mmol/L (ref 134–144)
Total Protein: 7.2 g/dL (ref 6.0–8.5)
eGFR: 70 mL/min/{1.73_m2} (ref >59)

## 2022-11-22 LAB — CBC
Hematocrit: 46.3 % (ref 34.0–46.6)
Hemoglobin: 14.9 g/dL (ref 11.1–15.9)
MCH: 29.5 pg (ref 26.6–33.0)
MCHC: 32.2 g/dL (ref 31.5–35.7)
MCV: 92 fL (ref 79–97)
Platelets: 157 10*3/uL (ref 150–450)
RBC: 5.05 x10E6/uL (ref 3.77–5.28)
RDW: 14.4 % (ref 11.7–15.4)
WBC: 5.6 10*3/uL (ref 3.4–10.8)

## 2022-11-22 LAB — T4, FREE: Free T4: 1.37 ng/dL (ref 0.82–1.77)

## 2022-11-22 LAB — TSH: TSH: 1.68 u[IU]/mL (ref 0.450–4.500)

## 2022-11-24 ENCOUNTER — Other Ambulatory Visit: Payer: Self-pay | Admitting: Cardiology

## 2022-11-26 ENCOUNTER — Other Ambulatory Visit: Payer: Self-pay | Admitting: *Deleted

## 2022-11-26 DIAGNOSIS — Z79899 Other long term (current) drug therapy: Secondary | ICD-10-CM

## 2022-11-29 ENCOUNTER — Other Ambulatory Visit: Payer: Self-pay | Admitting: Internal Medicine

## 2022-12-04 ENCOUNTER — Other Ambulatory Visit: Payer: Self-pay

## 2022-12-04 MED ORDER — NOVOLOG FLEXPEN 100 UNIT/ML ~~LOC~~ SOPN: PEN_INJECTOR | SUBCUTANEOUS | 3 refills | Status: DC

## 2022-12-10 ENCOUNTER — Ambulatory Visit: Payer: Medicare HMO | Attending: Cardiology

## 2022-12-10 ENCOUNTER — Telehealth: Payer: Self-pay

## 2022-12-10 DIAGNOSIS — I5042 Chronic combined systolic (congestive) and diastolic (congestive) heart failure: Secondary | ICD-10-CM

## 2022-12-10 DIAGNOSIS — Z95810 Presence of automatic (implantable) cardiac defibrillator: Secondary | ICD-10-CM | POA: Diagnosis not present

## 2022-12-10 NOTE — Telephone Encounter (Signed)
ICM call to patient.  Requested manual remote transmission to check monthly fluid levels and she agreed to send today.

## 2022-12-10 NOTE — Pre-Procedure Instructions (Signed)
Instructed patient on the following items: Arrival time 1000 Nothing to eat or drink after midnight No meds AM of procedure Responsible person to drive you home and stay with you for 24 hrs Wash with special soap night before and morning of procedure If on anti-coagulant drug instructions Eliquis- last dose was 11/30

## 2022-12-10 NOTE — Progress Notes (Signed)
EPIC Encounter for ICM Monitoring  Patient Name: [name] is a 72 y.o. female Date: 12/10/2022 Primary Care Physican: Elie Confer, NP Primary Cardiologist: Skains/Weaver PA  Electrophysiologist: Lalla Brothers 10/04/2021 Weight: 237 lbs 11/06/2021 Weight: 232 lbs 03/05/2022 Weight: 235 lbs 04/16/2022 Weight: 230 lbs 05/15/2022 Weight:  230 lbs 08/29/2022 Weight: 230 lbs 11/21/2022 Office Weight: 237 lbs   AT/AF Burden: 0% (taking Eliquis)                                                           Spoke with patient and heart failure questions reviewed.  Transmission results reviewed.  Pt asymptomatic for fluid accumulation.  Reports feeling well at this time and voices no complaints.      CorVue thoracic impedance suggesting normal fluid levels within the last month.      Prescribed: No diuretic since 04/2022 hospital discharge   Labs: 11/21/2022 Creatinine 0.88, BUN 17, Potassium 4.1, Sodium 140, GFR 70 08/31/2022 Creatinine 0.82, BUN 14, Potassium 5.6, Sodium 138, GFR >60 06/05/2022 Creatinine 1.01, BUN 17, Potassium 4.1, Sodium 141, GFR 59 05/01/2022 Creatinine 1.19, BUN 24, Potassium 4.8, Sodium 134, GFR 49  04/30/2022 Creatinine 1.43, BUN 19, Potassium 4.7, Sodium 137, GFR 39  04/29/2022 Creatinine 1.23, BUN 18, Potassium 4.6, Sodium 135, GFR 47  04/28/2022 Creatinine 1.33, BUN 21, Potassium 3.3, Sodium 137, GFR 43 04/27/2022 Creatinine 1.42, BUN 24, Potassium 5.3, Sodium 140, GFR 39  A complete set of results can be found in Results Review.   Recommendations:   No changes and encouraged to call if experiencing any fluid symptoms.   Next ICM clinic phone appointment due:  01/22/2023.     Next 91 day remote transmission due: 01/21/2023     EP/Cardiology Office Visits:   Recall 02/04/2023 with Dr Lalla Brothers.  12/12/2022 with Dr Anne Fu.   Copy of ICM check sent to Dr. Lalla Brothers.  3 month ICM trend: 12/10/2022.    12-14 Month ICM trend:     Karie Soda, RN 12/10/2022 4:25  PM

## 2022-12-11 ENCOUNTER — Encounter (HOSPITAL_COMMUNITY)
Admission: RE | Disposition: A | Discharge status: Home / Self Care | Payer: Medicare HMO | Source: Home / Self Care | Attending: Cardiology

## 2022-12-11 ENCOUNTER — Ambulatory Visit (HOSPITAL_COMMUNITY)
Admission: RE | Admit: 2022-12-11 | Discharge: 2022-12-11 | Disposition: A | Discharge status: Home / Self Care | Payer: Medicare HMO | Attending: Cardiology | Admitting: Cardiology

## 2022-12-11 ENCOUNTER — Other Ambulatory Visit: Payer: Self-pay

## 2022-12-11 DIAGNOSIS — Z4502 Encounter for adjustment and management of automatic implantable cardiac defibrillator: Secondary | ICD-10-CM

## 2022-12-11 DIAGNOSIS — Z79899 Other long term (current) drug therapy: Secondary | ICD-10-CM | POA: Diagnosis not present

## 2022-12-11 DIAGNOSIS — I447 Left bundle-branch block, unspecified: Secondary | ICD-10-CM | POA: Diagnosis not present

## 2022-12-11 DIAGNOSIS — I5022 Chronic systolic (congestive) heart failure: Secondary | ICD-10-CM

## 2022-12-11 DIAGNOSIS — Z95810 Presence of automatic (implantable) cardiac defibrillator: Secondary | ICD-10-CM | POA: Insufficient documentation

## 2022-12-11 DIAGNOSIS — I5042 Chronic combined systolic (congestive) and diastolic (congestive) heart failure: Secondary | ICD-10-CM | POA: Diagnosis present

## 2022-12-11 HISTORY — PX: BIV ICD GENERATOR CHANGEOUT: EP1194

## 2022-12-11 LAB — GLUCOSE, CAPILLARY: Glucose-Capillary: 141 mg/dL — ABNORMAL HIGH (ref 70–99)

## 2022-12-11 SURGERY — BIV ICD GENERATOR CHANGEOUT
Anesthesia: LOCAL

## 2022-12-11 MED ORDER — IOHEXOL 350 MG/ML SOLN
INTRAVENOUS | Status: DC | PRN
  Administered 2022-12-11: 15 mL

## 2022-12-11 MED ORDER — SODIUM CHLORIDE 0.9 % IV SOLN: 80.0000 mg | INTRAVENOUS | Status: DC

## 2022-12-11 MED ORDER — CEFAZOLIN SODIUM-DEXTROSE 2-4 GM/100ML-% IV SOLN: 2.0000 g | INTRAVENOUS | Status: DC

## 2022-12-11 MED ORDER — CEFAZOLIN SODIUM-DEXTROSE 2-4 GM/100ML-% IV SOLN
INTRAVENOUS | Status: AC
  Filled 2022-12-11: qty 100

## 2022-12-11 MED ORDER — CHLORHEXIDINE GLUCONATE 4 % EX SOLN: 4.0000 | Freq: Once | CUTANEOUS | Status: DC

## 2022-12-11 MED ORDER — SODIUM CHLORIDE 0.9 % IV SOLN
INTRAVENOUS | Status: AC
  Filled 2022-12-11: qty 2

## 2022-12-11 MED ORDER — SODIUM CHLORIDE 0.9 % IV SOLN: INTRAVENOUS | Status: DC

## 2022-12-11 MED ORDER — POVIDONE-IODINE 10 % EX SWAB
2.0000 | Freq: Once | CUTANEOUS | Status: AC
Start: 2022-12-11 — End: 2022-12-11
  Administered 2022-12-11: 2 via TOPICAL

## 2022-12-11 MED ORDER — LIDOCAINE HCL (PF) 1 % IJ SOLN
INTRAMUSCULAR | Status: AC
  Filled 2022-12-11: qty 60

## 2022-12-11 SURGICAL SUPPLY — 4 items
CABLE SURGICAL S-101-97-12 (CABLE) ×1 IMPLANT
ELECT DEFIB PAD ADLT CADENCE (PAD) IMPLANT
MAT PREVALON FULL STRYKER (MISCELLANEOUS) IMPLANT
TRAY PACEMAKER INSERTION (PACKS) ×1 IMPLANT

## 2022-12-11 NOTE — H&P (View-Only) (Signed)
  Electrophysiology Office Follow up Visit Note:     Date:  12/11/2022    ID:  [name], DOB [birth year], MRN [id number]   PCP:  Elie Confer, NP            [facility] Cardiologist:  Donato Schultz, MD  [facility] Electrophysiologist:  Lanier Prude, MD      Interval History:     [name] is a 72 y.o. female who presents for a follow up visit.    I last saw the patient January 31, 2022 for history of ventricular fibrillation and an ICD in situ.  She is maintained on amiodarone. She was hospitalized in April of this year with a hypotensive episode.  Her hypotension was not thought to be related to an arrhythmia.     She is with family today in clinic.  No other problems since I last saw her.   Presents for generator replacement and possible lead addition.  Objective Past medical, surgical, social and family history were reviewed.   ROS:   Please see the history of present illness.    All other systems reviewed and are negative.   EKGs/Labs/Other Studies Reviewed:     The following studies were reviewed today:   July 31, 2022 echo EF 25 to 30%, global hypokinesis RV function mildly reduced   April 30, 2022 EKG shows sinus rhythm with left bundle branch block   August 08, 2022 in clinic device interrogation personally reviewed 6 mo estimated until ERI Lead parameters stable.         Physical Exam:     VS:  BP 126/66   Pulse (!) 59   Ht 5\' 3"  (1.6 m)   Wt 240 lb (108.9 kg)   SpO2 97%   BMI 42.51 kg/m         Wt Readings from Last 3 Encounters:  08/08/22 240 lb (108.9 kg)  06/05/22 235 lb (106.6 kg)  05/07/22 234 lb 9.6 oz (106.4 kg)      GEN:  Well nourished, well developed in no acute distress CARDIAC: RRR, no murmurs, rubs, gallops.  ICD pocket well-healed RESPIRATORY:  Clear to auscultation without rales, wheezing or rhonchi          Assessment ASSESSMENT:     1. Chronic combined systolic and diastolic heart failure (HCC)    2. ICD (implantable cardioverter-defibrillator) in place   3. Encounter for long-term (current) use of high-risk medication     PLAN:     In order of problems listed above:   #Chronic systolic heart failure #ICD in situ Device functioning appropriately.  NYHA class II-III symptoms.  Warm dry on exam.  Continue Entresto LBBB on ECG. Plan for generator replacement today with possible lead addition (CS or LBB area lead).  I have discussed this with the patient and she wishes to proceed. She held her [facility] for 48 hours.  Risks, benefits, alternatives to generator change and lead addition were discussed in detail with the patient today. The patient understands that the risks include but are not limited to bleeding, infection, pneumothorax, perforation, tamponade, vascular damage, renal failure, MI, stroke, death, and lead dislodgement and wishes to proceed.           Signed, Steffanie Dunn, MD, [facility], [facility] 12/11/2022 Electrophysiology [facility]

## 2022-12-11 NOTE — H&P (Signed)
  Electrophysiology Office Follow up Visit Note:     Date:  12/11/2022    ID:  [name], DOB [birth year], MRN [id number]   PCP:  Elie Confer, NP            [facility] Cardiologist:  Donato Schultz, MD  [facility] Electrophysiologist:  Lanier Prude, MD      Interval History:     [name] is a 72 y.o. female who presents for a follow up visit.    I last saw the patient January 31, 2022 for history of ventricular fibrillation and an ICD in situ.  She is maintained on amiodarone. She was hospitalized in April of this year with a hypotensive episode.  Her hypotension was not thought to be related to an arrhythmia.     She is with family today in clinic.  No other problems since I last saw her.   Presents for generator replacement and possible lead addition.  Objective Past medical, surgical, social and family history were reviewed.   ROS:   Please see the history of present illness.    All other systems reviewed and are negative.   EKGs/Labs/Other Studies Reviewed:     The following studies were reviewed today:   July 31, 2022 echo EF 25 to 30%, global hypokinesis RV function mildly reduced   April 30, 2022 EKG shows sinus rhythm with left bundle branch block   August 08, 2022 in clinic device interrogation personally reviewed 6 mo estimated until ERI Lead parameters stable.         Physical Exam:     VS:  BP 126/66   Pulse (!) 59   Ht 5\' 3"  (1.6 m)   Wt 240 lb (108.9 kg)   SpO2 97%   BMI 42.51 kg/m         Wt Readings from Last 3 Encounters:  08/08/22 240 lb (108.9 kg)  06/05/22 235 lb (106.6 kg)  05/07/22 234 lb 9.6 oz (106.4 kg)      GEN:  Well nourished, well developed in no acute distress CARDIAC: RRR, no murmurs, rubs, gallops.  ICD pocket well-healed RESPIRATORY:  Clear to auscultation without rales, wheezing or rhonchi          Assessment ASSESSMENT:     1. Chronic combined systolic and diastolic heart failure (HCC)    2. ICD (implantable cardioverter-defibrillator) in place   3. Encounter for long-term (current) use of high-risk medication     PLAN:     In order of problems listed above:   #Chronic systolic heart failure #ICD in situ Device functioning appropriately.  NYHA class II-III symptoms.  Warm dry on exam.  Continue Entresto LBBB on ECG. Plan for generator replacement today with possible lead addition (CS or LBB area lead).  I have discussed this with the patient and she wishes to proceed. She held her [facility] for 48 hours.  Risks, benefits, alternatives to generator change and lead addition were discussed in detail with the patient today. The patient understands that the risks include but are not limited to bleeding, infection, pneumothorax, perforation, tamponade, vascular damage, renal failure, MI, stroke, death, and lead dislodgement and wishes to proceed.           Signed, Steffanie Dunn, MD, [facility], [facility] 12/11/2022 Electrophysiology [facility]

## 2022-12-12 ENCOUNTER — Encounter (HOSPITAL_COMMUNITY): Payer: Self-pay | Admitting: Cardiology

## 2022-12-12 ENCOUNTER — Telehealth: Payer: Self-pay

## 2022-12-12 ENCOUNTER — Ambulatory Visit: Payer: Medicare HMO | Attending: Cardiology | Admitting: Cardiology

## 2022-12-12 ENCOUNTER — Telehealth: Payer: Self-pay | Admitting: *Deleted

## 2022-12-12 ENCOUNTER — Other Ambulatory Visit: Payer: Self-pay

## 2022-12-12 VITALS — BP 120/70 | HR 51 | Ht 63.0 in | Wt 232.4 lb

## 2022-12-12 DIAGNOSIS — I5042 Chronic combined systolic (congestive) and diastolic (congestive) heart failure: Secondary | ICD-10-CM

## 2022-12-12 DIAGNOSIS — I2510 Atherosclerotic heart disease of native coronary artery without angina pectoris: Secondary | ICD-10-CM

## 2022-12-12 DIAGNOSIS — Z95810 Presence of automatic (implantable) cardiac defibrillator: Secondary | ICD-10-CM | POA: Diagnosis not present

## 2022-12-12 NOTE — Telephone Encounter (Signed)
Left message for patient to call back  

## 2022-12-12 NOTE — Patient Instructions (Signed)
Medication Instructions:  Your physician recommends that you continue on your current medications as directed. Please refer to the Current Medication list given to you today.  *If you need a refill on your cardiac medications before your next appointment, please call your pharmacy*  Lab Work: None ordered today.  Testing/Procedures: None ordered today.  Follow-Up: At [facility], you and your health needs are our priority.  As part of our continuing mission to provide you with exceptional heart care, we have created designated Provider Care Teams.  These Care Teams include your primary Cardiologist (physician) and Advanced Practice Providers (APPs -  Physician Assistants and Nurse Practitioners) who all work together to provide you with the care you need, when you need it.  We recommend signing up for the patient portal called "MyChart".  Sign up information is provided on this After Visit Summary.  MyChart is used to connect with patients for Virtual Visits (Telemedicine).  Patients are able to view lab/test results, encounter notes, upcoming appointments, etc.  Non-urgent messages can be sent to your provider as well.   To learn more about what you can do with MyChart, go to [email or web address].    Your next appointment:   6 month(s)  The format for your next appointment:   In Person  Provider:   Jari Favre, PA-C, Robin Searing, NP, or Perlie Gold, PA-C     Then, Donato Schultz, MD will plan to see you again in 1 year(s).

## 2022-12-12 NOTE — Progress Notes (Signed)
Cardiology Office Note:  .   Date:  12/12/2022  ID:  [name], DOB 1950-09-04, MRN [id number] PCP: Elie Confer, NP  [facility] Providers Cardiologist:  Donato Schultz, MD Electrophysiologist:  Lanier Prude, MD     History of Present Illness: [name] is a 72 y.o. female Discussed with the use of AI scribe   History of Present Illness   The 72 year old patient with a history of ischemic cardiomyopathy, ventricular tachycardia/ventricular fibrillation (VTVF), left bundle branch block, reduced ejection fraction, and chronic systolic heart failure was scheduled for a biventricular ICD generator change due to the device reaching its elective replacement indicator (ERI) status. The patient also has chronic back pain and is scheduled for a kyphoplasty procedure. The patient's last EKG showed sinus rhythm with occasional PVCs and PACs, and an echocardiogram revealed an EF of 25-30% with left bundle branch block dyssynchrony and mildly reduced right ventricular function. The patient is on amiodarone for anti-arrhythmic therapy and has had appropriate therapy for VEF and VT in the past.  The patient reports that the left antecubital vein "burst" during the IV insertion, leading to the procedure being postponed for a couple of weeks to allow the area to heal. The patient has resumed taking Eliquis following the postponed procedure.  The patient's weight has been stable, ranging between 235 and 240 pounds, and her NYHA class is between 2 and 3. She is on goal-directed medical therapy with Sherryll Burger and has been holding her Eliquis 48 hours prior to the planned generator replacement. The patient's creatinine is 0.88, potassium is 4.1, hemoglobin is 14.9, TSH is 1.6, and free T4 is 1.37.  Despite the postponed procedure, the patient reports feeling well overall, with stable fluid levels and no significant respiratory symptoms. The patient is compliant with her medication  regimen, which includes Eliquis, Farxiga, Zetia, Crestor, and [place]. The patient's physical examination was unremarkable, with clear lung sounds and no significant edema noted in the legs.          Studies Reviewed: .        Results LABS Cr: 0.88 (11/21/2022) K: 4.1 (11/21/2022) Hb: 14.9 (11/21/2022) TSH: 1.6 (11/21/2022) Free T4: 1.37 (11/21/2022)  DIAGNOSTIC EKG: Sinus rhythm 63 with occasional PVCs and PACs with nonspecific intraventricular conduction block (11/21/2022) Echocardiogram: EF 25-30% with left bundle branch block dyssynchrony. Right ventricular function mildly reduced (July 2024)  Risk Assessment/Calculations:            Physical Exam:   VS:  BP 120/70   Pulse (!) 51   Ht 5\' 3"  (1.6 m)   Wt 232 lb 6.4 oz (105.4 kg)   SpO2 96%   BMI 41.17 kg/m    Wt Readings from Last 3 Encounters:  12/12/22 232 lb 6.4 oz (105.4 kg)  12/11/22 235 lb (106.6 kg)  11/21/22 237 lb 6.4 oz (107.7 kg)    GEN: Well nourished, well developed in no acute distress NECK: No JVD; No carotid bruits CARDIAC: RRR, no murmurs, no rubs, no gallops RESPIRATORY:  Clear to auscultation without rales, wheezing or rhonchi  ABDOMEN: Soft, non-tender, non-distended EXTREMITIES:  minimal LE edema; No deformity   ASSESSMENT AND PLAN: .    Assessment and Plan    Ischemic Cardiomyopathy CABG with Reduced Ejection Fraction Ischemic cardiomyopathy with EF 25-30% (July 2024) and LBBB dyssynchrony. On Entresto and amiodarone. Recent echocardiogram: mildly reduced right ventricular function. NYHA class II-III, CHADS-VASc score 6. EKG: sinus rhythm with occasional PVCs  and PACs. Emphasized medication adherence to prevent hospitalizations and manage symptoms. - Continue Entresto 24/26 mg - Continue amiodarone with ongoing surveillance (complete metabolic profile, TSH, free T4) - Monitor for heart failure symptoms and arrhythmias  Chronic Systolic Heart Failure Chronic systolic heart failure  secondary to ischemic cardiomyopathy. No recent hospitalizations, good fluid management. On Entresto, Farxiga, and Crestor. Discussed fluid management and weight monitoring to prevent exacerbations. - Continue Farxiga 10 mg, Crestor 20 mg - Monitor weight and fluid status - Encourage medication adherence  Ventricular Tachycardia and Ventricular Fibrillation Appropriate ICD therapy for VF (January 2021) and VT (January 2024). ICD generator change postponed due to IV access issues. Discussed rescheduling and holding Eliquis 48 hours prior to procedure to reduce bleeding risk. - Reschedule ICD generator change (team will be intouch) - Hold Eliquis 48 hours prior to procedure - Resume Eliquis post-procedure  Chronic Back Pain Chronic back pain, kyphoplasty considered. Cleared by Dr. Venita Lick, postponed until six weeks post-ICD generator change. Discussed waiting for complete healing post-ICD change before kyphoplasty. - Schedule kyphoplasty six weeks post-ICD generator change  General Health Maintenance Recent labs: creatinine 0.88, potassium 4.1, hemoglobin 14.9, TSH 1.6, free T4 1.37. - Continue regular lab monitoring  Follow-up - Follow-up in six months with cardiologist - Ensure EP scheduling department contacts patient for ICD generator change.               Signed, Donato Schultz, MD

## 2022-12-12 NOTE — Telephone Encounter (Signed)
Left message for patient to call back to reschedule her procedure with Dr. Lalla Brothers

## 2022-12-12 NOTE — Telephone Encounter (Signed)
Spoke with the patient and her procedure has been rescheduled for 12/20.

## 2022-12-12 NOTE — Telephone Encounter (Signed)
Patient returned RN's call. 

## 2022-12-12 NOTE — Telephone Encounter (Signed)
Pt did not get implanted

## 2022-12-13 ENCOUNTER — Other Ambulatory Visit: Payer: Self-pay | Admitting: Cardiology

## 2022-12-14 LAB — BASIC METABOLIC PANEL
BUN/Creatinine Ratio: 20 (ref 12–28)
BUN: 16 mg/dL (ref 8–27)
CO2: 22 mmol/L (ref 20–29)
Calcium: 9.8 mg/dL (ref 8.7–10.3)
Chloride: 106 mmol/L (ref 96–106)
Creatinine, Ser: 0.82 mg/dL (ref 0.57–1.00)
Glucose: 155 mg/dL — ABNORMAL HIGH (ref 70–99)
Potassium: 4.3 mmol/L (ref 3.5–5.2)
Sodium: 144 mmol/L (ref 134–144)
eGFR: 76 mL/min/{1.73_m2} (ref >59)

## 2022-12-14 LAB — CBC WITH DIFFERENTIAL/PLATELET
Basophils Absolute: 0 10*3/uL (ref 0.0–0.2)
Basos: 1 %
EOS (ABSOLUTE): 0.1 10*3/uL (ref 0.0–0.4)
Eos: 1 %
Hematocrit: 45.4 % (ref 34.0–46.6)
Hemoglobin: 14.3 g/dL (ref 11.1–15.9)
Immature Grans (Abs): 0 10*3/uL (ref 0.0–0.1)
Immature Granulocytes: 0 %
Lymphocytes Absolute: 2.7 10*3/uL (ref 0.7–3.1)
Lymphs: 47 %
MCH: 29.8 pg (ref 26.6–33.0)
MCHC: 31.5 g/dL (ref 31.5–35.7)
MCV: 95 fL (ref 79–97)
Monocytes Absolute: 0.4 10*3/uL (ref 0.1–0.9)
Monocytes: 7 %
Neutrophils Absolute: 2.6 10*3/uL (ref 1.4–7.0)
Neutrophils: 44 %
Platelets: 144 10*3/uL — ABNORMAL LOW (ref 150–450)
RBC: 4.8 x10E6/uL (ref 3.77–5.28)
RDW: 14.1 % (ref 11.7–15.4)
WBC: 5.8 10*3/uL (ref 3.4–10.8)

## 2022-12-19 ENCOUNTER — Encounter: Payer: Self-pay | Admitting: Podiatry

## 2022-12-19 ENCOUNTER — Ambulatory Visit (INDEPENDENT_AMBULATORY_CARE_PROVIDER_SITE_OTHER): Payer: Medicare HMO | Admitting: Podiatry

## 2022-12-19 VITALS — Ht 63.0 in | Wt 232.4 lb

## 2022-12-19 DIAGNOSIS — E1151 Type 2 diabetes mellitus with diabetic peripheral angiopathy without gangrene: Secondary | ICD-10-CM

## 2022-12-19 DIAGNOSIS — B351 Tinea unguium: Secondary | ICD-10-CM

## 2022-12-19 DIAGNOSIS — M79674 Pain in right toe(s): Secondary | ICD-10-CM | POA: Diagnosis not present

## 2022-12-19 DIAGNOSIS — I739 Peripheral vascular disease, unspecified: Secondary | ICD-10-CM | POA: Diagnosis not present

## 2022-12-19 DIAGNOSIS — M79675 Pain in left toe(s): Secondary | ICD-10-CM

## 2022-12-20 NOTE — Progress Notes (Signed)
Subjective:  Patient ID: [name], female    DOB: Nov 01, 1950,  MRN: [id number]  72 y.o. female presents at risk foot care. Pt has h/o NIDDM with PAD and painful elongated mycotic toenails 1-5 bilaterally which are tender when wearing enclosed shoe gear. Pain is relieved with periodic professional debridement.  Chief Complaint  Patient presents with   Nail Problem    Pt is here for [facility] last A1C was 7 PCP is Dr Gwenevere Abbot and LOV was last month.    PCP is Elie Confer, NP.  Allergies  Allergen Reactions   Metformin And Related Itching, Swelling and Other (See Comments)    Leg pain & swelling in legs   Ozempic (0.25 Or 0.5 Mg-Dose) [Semaglutide(0.25 Or 0.5mg -Dos)] Other (See Comments)    Pt with hx of pancreatitis    Atorvastatin Calcium Itching and Rash   Levofloxacin Itching    Review of Systems: Negative except as noted in the HPI.   Objective:  [place] [name] is a pleasant 72 y.o. female obese in NAD. AAO x 3.  Vascular Examination: Capillary fill time to digits <3 seconds left foot. Capillary refill time to digits <5 seconds right foot. Diminished DP/PT pulses b/l LE. Pedal hair absent. No pain with calf compression b/l. Right foot cool to touch. Left foot warm. No ischemia/gangrene. No symptoms of rest pain per patient. Trace edema noted BLE. No ischemia or gangrene noted b/l LE. No cyanosis or clubbing noted b/l LE.  Neurological Examination: Sensation grossly intact b/l with 10 gram monofilament. Vibratory sensation intact b/l.  Dermatological Examination: Pedal skin with normal turgor, texture and tone b/l. No open wounds nor interdigital macerations noted. Toenails 1-5 b/l thick, discolored, elongated with subungual debris and pain on dorsal palpation. No hyperkeratotic lesions noted b/l.   Musculoskeletal Examination: Muscle strength 5/5 to b/l LE.  No pain, crepitus noted b/l. No gross pedal deformities. Patient ambulates independently without assistive  aids.   Radiographs: None  Last A1c:      Latest Ref Rng & Units 11/08/2022    7:57 AM 05/07/2022    8:31 AM  Hemoglobin A1C  Hemoglobin-A1c 4.0 - 5.6 % 7.0  7.4      Assessment:   1. Pain due to onychomycosis of toenails of both feet   2. Cold foot with peripheral vascular disease (HCC)   3. Type II diabetes mellitus with peripheral circulatory disorder (HCC)    Plan:   Orders Placed This Encounter  Procedures   VAS [name] ABI WITH/WO TBI   -Consent given for treatment as described below: -Examined patient. -Due to findings of temperature change right foot, ABI's ordered and will forward findings to Vascular Surgery. -Continue foot and shoe inspections daily. Monitor blood glucose per PCP/Endocrinologist's recommendations. -Toenails 1-5 b/l were debrided in length and girth with sterile nail nippers and dremel without iatrogenic bleeding.  -Patient/POA to call should there be question/concern in the interim.  Return in about 3 months (around 03/19/2023).  Freddie Breech, DPM      [place] LOCATION: [address], [place] [address]                   Office [phone]  [place] LOCATION: [address] [place], [place] [address] Office [phone]

## 2022-12-27 ENCOUNTER — Ambulatory Visit: Payer: Medicare HMO

## 2022-12-27 NOTE — Pre-Procedure Instructions (Signed)
Instructed patient on the following items: Arrival time 1230 Nothing to eat or drink after midnight No meds AM of procedure Responsible person to drive you home and stay with you for 24 hrs Wash with special soap night before and morning of procedure If on anti-coagulant drug instructions Eliquis- last dose was 12/17

## 2022-12-28 ENCOUNTER — Ambulatory Visit (HOSPITAL_COMMUNITY)
Admission: RE | Disposition: A | Discharge status: Home / Self Care | Payer: Medicare HMO | Source: Home / Self Care | Attending: Cardiology

## 2022-12-28 ENCOUNTER — Other Ambulatory Visit: Payer: Self-pay

## 2022-12-28 ENCOUNTER — Ambulatory Visit (HOSPITAL_COMMUNITY)
Admission: RE | Admit: 2022-12-28 | Discharge: 2022-12-29 | Disposition: A | Discharge status: Home / Self Care | Payer: Medicare HMO | Attending: Cardiology | Admitting: Cardiology

## 2022-12-28 DIAGNOSIS — I447 Left bundle-branch block, unspecified: Secondary | ICD-10-CM | POA: Insufficient documentation

## 2022-12-28 DIAGNOSIS — I5022 Chronic systolic (congestive) heart failure: Secondary | ICD-10-CM | POA: Diagnosis not present

## 2022-12-28 DIAGNOSIS — I5042 Chronic combined systolic (congestive) and diastolic (congestive) heart failure: Secondary | ICD-10-CM | POA: Insufficient documentation

## 2022-12-28 DIAGNOSIS — Z951 Presence of aortocoronary bypass graft: Secondary | ICD-10-CM | POA: Diagnosis not present

## 2022-12-28 DIAGNOSIS — Z7901 Long term (current) use of anticoagulants: Secondary | ICD-10-CM | POA: Insufficient documentation

## 2022-12-28 DIAGNOSIS — Z8679 Personal history of other diseases of the circulatory system: Secondary | ICD-10-CM | POA: Diagnosis not present

## 2022-12-28 DIAGNOSIS — Z79899 Other long term (current) drug therapy: Secondary | ICD-10-CM | POA: Insufficient documentation

## 2022-12-28 DIAGNOSIS — Z86718 Personal history of other venous thrombosis and embolism: Secondary | ICD-10-CM | POA: Diagnosis not present

## 2022-12-28 DIAGNOSIS — Z950 Presence of cardiac pacemaker: Secondary | ICD-10-CM | POA: Diagnosis present

## 2022-12-28 DIAGNOSIS — I2510 Atherosclerotic heart disease of native coronary artery without angina pectoris: Secondary | ICD-10-CM | POA: Insufficient documentation

## 2022-12-28 DIAGNOSIS — Z4502 Encounter for adjustment and management of automatic implantable cardiac defibrillator: Secondary | ICD-10-CM | POA: Diagnosis not present

## 2022-12-28 HISTORY — PX: BIV ICD GENERATOR CHANGEOUT: EP1194

## 2022-12-28 HISTORY — PX: BIV UPGRADE: EP1202

## 2022-12-28 LAB — GLUCOSE, CAPILLARY
Glucose-Capillary: 122 mg/dL — ABNORMAL HIGH (ref 70–99)
Glucose-Capillary: 110 mg/dL — ABNORMAL HIGH (ref 70–99)
Glucose-Capillary: 188 mg/dL — ABNORMAL HIGH (ref 70–99)

## 2022-12-28 SURGERY — BIV ICD GENERATOR CHANGEOUT

## 2022-12-28 MED ORDER — CEFAZOLIN SODIUM-DEXTROSE 2-4 GM/100ML-% IV SOLN: 2.0000 g | INTRAVENOUS | Status: AC

## 2022-12-28 MED ORDER — LIDOCAINE HCL (PF) 1 % IJ SOLN
INTRAMUSCULAR | Status: DC | PRN
  Administered 2022-12-28: 80 mL

## 2022-12-28 MED ORDER — LIDOCAINE HCL (PF) 1 % IJ SOLN
INTRAMUSCULAR | Status: AC
  Filled 2022-12-28: qty 30

## 2022-12-28 MED ORDER — ONDANSETRON HCL 4 MG/2ML IJ SOLN: 4.0000 mg | Freq: Four times a day (QID) | INTRAMUSCULAR | Status: DC | PRN

## 2022-12-28 MED ORDER — SODIUM CHLORIDE 0.9 % IV SOLN: 80.0000 mg | INTRAVENOUS | Status: DC

## 2022-12-28 MED ORDER — LIDOCAINE HCL (PF) 1 % IJ SOLN
INTRAMUSCULAR | Status: AC
Start: 2022-12-28 — End: ?
  Filled 2022-12-28: qty 30

## 2022-12-28 MED ORDER — AMIODARONE HCL 100 MG PO TABS
100.0000 mg | ORAL_TABLET | Freq: Every day | ORAL | Status: DC
  Administered 2022-12-29: 100 mg via ORAL
  Filled 2022-12-28: qty 1

## 2022-12-28 MED ORDER — SODIUM CHLORIDE 0.9 % IV SOLN
INTRAVENOUS | Status: AC
  Filled 2022-12-28: qty 2

## 2022-12-28 MED ORDER — SODIUM CHLORIDE 0.9 % IV SOLN: INTRAVENOUS | Status: DC

## 2022-12-28 MED ORDER — INSULIN ASPART 100 UNIT/ML IJ SOLN
0.0000 [IU] | Freq: Three times a day (TID) | INTRAMUSCULAR | Status: DC
  Administered 2022-12-29: 5 [IU] via SUBCUTANEOUS
  Administered 2022-12-29: 3 [IU] via SUBCUTANEOUS

## 2022-12-28 MED ORDER — MIDAZOLAM HCL 5 MG/5ML IJ SOLN
INTRAMUSCULAR | Status: DC | PRN
  Administered 2022-12-28 (×2): 1 mg via INTRAVENOUS

## 2022-12-28 MED ORDER — FENTANYL CITRATE (PF) 100 MCG/2ML IJ SOLN
INTRAMUSCULAR | Status: AC
  Filled 2022-12-28: qty 2

## 2022-12-28 MED ORDER — POVIDONE-IODINE 10 % EX SWAB
2.0000 | Freq: Once | CUTANEOUS | Status: AC
  Administered 2022-12-28: 2 via TOPICAL

## 2022-12-28 MED ORDER — INSULIN GLARGINE-YFGN 100 UNIT/ML ~~LOC~~ SOLN
30.0000 [IU] | Freq: Every day | SUBCUTANEOUS | Status: DC
  Administered 2022-12-28: 30 [IU] via SUBCUTANEOUS
  Filled 2022-12-28 (×3): qty 0.3

## 2022-12-28 MED ORDER — EZETIMIBE 10 MG PO TABS
10.0000 mg | ORAL_TABLET | Freq: Every day | ORAL | Status: DC
  Administered 2022-12-28 – 2022-12-29 (×2): 10 mg via ORAL
  Filled 2022-12-28 (×2): qty 1

## 2022-12-28 MED ORDER — FENTANYL CITRATE (PF) 100 MCG/2ML IJ SOLN
INTRAMUSCULAR | Status: DC | PRN
  Administered 2022-12-28 (×2): 25 ug via INTRAVENOUS

## 2022-12-28 MED ORDER — MIDAZOLAM HCL 2 MG/2ML IJ SOLN
INTRAMUSCULAR | Status: AC
  Filled 2022-12-28: qty 2

## 2022-12-28 MED ORDER — ROSUVASTATIN CALCIUM 20 MG PO TABS
20.0000 mg | ORAL_TABLET | Freq: Every day | ORAL | Status: DC
  Administered 2022-12-28: 20 mg via ORAL
  Filled 2022-12-28: qty 1

## 2022-12-28 MED ORDER — IOHEXOL 350 MG/ML SOLN
INTRAVENOUS | Status: DC | PRN
  Administered 2022-12-28: 15 mL

## 2022-12-28 MED ORDER — SACUBITRIL-VALSARTAN 24-26 MG PO TABS
1.0000 | ORAL_TABLET | Freq: Two times a day (BID) | ORAL | Status: DC
  Administered 2022-12-28 – 2022-12-29 (×2): 1 via ORAL
  Filled 2022-12-28 (×2): qty 1

## 2022-12-28 MED ORDER — DAPAGLIFLOZIN PROPANEDIOL 10 MG PO TABS
10.0000 mg | ORAL_TABLET | Freq: Every day | ORAL | Status: DC
  Administered 2022-12-29: 10 mg via ORAL
  Filled 2022-12-28: qty 1

## 2022-12-28 MED ORDER — ACETAMINOPHEN 325 MG PO TABS
325.0000 mg | ORAL_TABLET | ORAL | Status: DC | PRN
  Administered 2022-12-29: 325 mg via ORAL
  Administered 2022-12-29: 650 mg via ORAL
  Filled 2022-12-28 (×2): qty 2

## 2022-12-28 MED ORDER — CEFAZOLIN SODIUM-DEXTROSE 2-4 GM/100ML-% IV SOLN
INTRAVENOUS | Status: AC
  Administered 2022-12-28: 2 g via INTRAVENOUS
  Filled 2022-12-28: qty 100

## 2022-12-28 SURGICAL SUPPLY — 16 items
CABLE SURGICAL S-101-97-12 (CABLE) ×2 IMPLANT
CATH CPS LOCATOR 3D LG (CATHETERS) IMPLANT
CATH CPS LOCATOR 3D MED (CATHETERS) IMPLANT
GUIDEWIRE VASC J-TIP .035X150 (WIRE) IMPLANT
HELIX LOCKING TOOL (MISCELLANEOUS) ×2
ICD UNIFY ASURA CRT CD3357-40C (ICD Generator) IMPLANT
KIT MICROPUNCTURE NIT STIFF (SHEATH) IMPLANT
LEAD ULTIPACE 65 LPA1231/65 (Lead) IMPLANT
PAD DEFIB RADIO PHYSIO CONN (PAD) ×2 IMPLANT
POUCH AIGIS-R ANTIBACT ICD (Mesh General) ×2 IMPLANT
POUCH AIGIS-R ANTIBACT ICD LRG (Mesh General) IMPLANT
SHEATH 9FR PRELUDE SNAP 13 (SHEATH) IMPLANT
SLITTER AGILIS HISPRO (INSTRUMENTS) IMPLANT
TOOL HELIX LOCKING (MISCELLANEOUS) IMPLANT
TRAY PACEMAKER INSERTION (PACKS) ×2 IMPLANT
WIRE HI TORQ VERSACORE-J 145CM (WIRE) IMPLANT

## 2022-12-28 NOTE — Interval H&P Note (Signed)
History and Physical Interval Note:  12/28/2022 2:53 PM  [name]  has presented today for surgery, with the diagnosis of heart failure.  The various methods of treatment have been discussed with the patient and family. After consideration of risks, benefits and other options for treatment, the patient has consented to  Procedure(s): BIV ICD GENERATOR CHANGEOUT (N/A) as a surgical intervention.  The patient's history has been reviewed, patient examined, no change in status, stable for surgery.  I have reviewed the patient's chart and labs.  Questions were answered to the patient's satisfaction.     [name] T Taichi Repka

## 2022-12-28 NOTE — Plan of Care (Signed)
  Problem: Education: Goal: Knowledge of cardiac device and self-care will improve Outcome: Progressing Goal: Ability to safely manage health related needs after discharge will improve Outcome: Progressing Goal: Individualized Educational Video(s) Outcome: Progressing   Problem: Cardiac: Goal: Ability to achieve and maintain adequate cardiopulmonary perfusion will improve Outcome: Progressing   Problem: Education: Goal: Knowledge of General Education information will improve Description: Including pain rating scale, medication(s)/side effects and non-pharmacologic comfort measures Outcome: Progressing   Problem: Health Behavior/Discharge Planning: Goal: Ability to manage health-related needs will improve Outcome: Progressing   Problem: Clinical Measurements: Goal: Ability to maintain clinical measurements within normal limits will improve Outcome: Progressing Goal: Will remain free from infection Outcome: Progressing Goal: Diagnostic test results will improve Outcome: Progressing Goal: Respiratory complications will improve Outcome: Progressing Goal: Cardiovascular complication will be avoided Outcome: Progressing   Problem: Activity: Goal: Risk for activity intolerance will decrease Outcome: Progressing   Problem: Nutrition: Goal: Adequate nutrition will be maintained Outcome: Progressing   Problem: Coping: Goal: Level of anxiety will decrease Outcome: Progressing   Problem: Elimination: Goal: Will not experience complications related to bowel motility Outcome: Progressing Goal: Will not experience complications related to urinary retention Outcome: Progressing   Problem: Pain Management: Goal: General experience of comfort will improve Outcome: Progressing   Problem: Safety: Goal: Ability to remain free from injury will improve Outcome: Progressing   Problem: Skin Integrity: Goal: Risk for impaired skin integrity will decrease Outcome: Progressing    Problem: Education: Goal: Ability to describe self-care measures that may prevent or decrease complications (Diabetes Survival Skills Education) will improve Outcome: Progressing Goal: Individualized Educational Video(s) Outcome: Progressing   Problem: Coping: Goal: Ability to adjust to condition or change in health will improve Outcome: Progressing   Problem: Fluid Volume: Goal: Ability to maintain a balanced intake and output will improve Outcome: Progressing   Problem: Health Behavior/Discharge Planning: Goal: Ability to identify and utilize available resources and services will improve Outcome: Progressing Goal: Ability to manage health-related needs will improve Outcome: Progressing   Problem: Metabolic: Goal: Ability to maintain appropriate glucose levels will improve Outcome: Progressing   Problem: Nutritional: Goal: Maintenance of adequate nutrition will improve Outcome: Progressing Goal: Progress toward achieving an optimal weight will improve Outcome: Progressing   Problem: Skin Integrity: Goal: Risk for impaired skin integrity will decrease Outcome: Progressing   Problem: Tissue Perfusion: Goal: Adequacy of tissue perfusion will improve Outcome: Progressing

## 2022-12-29 ENCOUNTER — Ambulatory Visit (HOSPITAL_COMMUNITY): Payer: Medicare HMO

## 2022-12-29 DIAGNOSIS — Z950 Presence of cardiac pacemaker: Secondary | ICD-10-CM

## 2022-12-29 DIAGNOSIS — I447 Left bundle-branch block, unspecified: Secondary | ICD-10-CM | POA: Diagnosis not present

## 2022-12-29 DIAGNOSIS — Z4502 Encounter for adjustment and management of automatic implantable cardiac defibrillator: Secondary | ICD-10-CM | POA: Diagnosis not present

## 2022-12-29 DIAGNOSIS — I2510 Atherosclerotic heart disease of native coronary artery without angina pectoris: Secondary | ICD-10-CM | POA: Diagnosis not present

## 2022-12-29 DIAGNOSIS — I5042 Chronic combined systolic (congestive) and diastolic (congestive) heart failure: Secondary | ICD-10-CM | POA: Diagnosis not present

## 2022-12-29 LAB — GLUCOSE, CAPILLARY
Glucose-Capillary: 202 mg/dL — ABNORMAL HIGH (ref 70–99)
Glucose-Capillary: 253 mg/dL — ABNORMAL HIGH (ref 70–99)

## 2022-12-29 NOTE — Discharge Summary (Cosign Needed Addendum)
ELECTROPHYSIOLOGY PROCEDURE DISCHARGE SUMMARY    Patient ID: [name],  MRN: [id number], DOB/AGE: Sep 26, 1950 72 y.o.  Admit date: 12/28/2022 Discharge date: 12/29/2022  Primary Care Physician: Elie Confer, NP  Primary Cardiologist: Donato Schultz, MD  Electrophysiologist: Dr. Lalla Brothers    Primary Diagnosis:  Chronic combined CHF  Secondary Diagnosis: LBBB ICD in situ CAD H/o DVT  Allergies  Allergen Reactions   Metformin And Related Itching, Swelling and Other (See Comments)    Leg pain & swelling in legs   Ozempic (0.25 Or 0.5 Mg-Dose) [Semaglutide(0.25 Or 0.5mg -Dos)] Other (See Comments)    Pt with hx of pancreatitis    Atorvastatin Calcium Itching and Rash   Levofloxacin Itching     Procedures This Admission:  ICD generator changeout 1. Venogram of the LUE demonstrating patent left subclavian and axillary veins with a stenosis medial to the first rib. 2. Successful implantation of a left bundle area lead for resynchronization therapy.  EKG after the case demonstrated narrowing of the QRS 138 ms (preprocedure EKG with 160 ms QRS). 3. Successful generator replacement ([employer] Unify Assura 3357-40C, serial [id number]).   CXR today demonstrated: 1. Left chest multilead pacer defibrillator, including additional abandoned leads. 2. Cardiomegaly status post median sternotomy. 3. No acute abnormality in low volume AP portable projection.      Brief HPI: [name] is a 72 y.o. female was seen by Dr. Lalla Brothers in the outpatient setting. She has a history of CAD s/p prior CABG, LBBB, prior VT, VF, ICD, chronic combined heart failure, chronic back pain, history of DVT on chronic anticoagulation, DM. Dr. Lalla Brothers evaluated the patient as outpatient and recommended plan for generator change with possible lead addition given LBBB. Eliquis was held 48 hours pre-procedure.  Risks, benefits, and alternatives to ICD implantation were reviewed with the patient who  wished to proceed.   Hospital Course:  The patient was admitted and underwent implantation of a [employer] Unify Assura 3357-40C, serial [id number] generator change with successful implantation of a left bundle area lead for resynchronization therapy. They were monitored on telemetry overnight which demonstrated  AV pacing  .  Left chest was without hematoma or ecchymosis.  The device was interrogated and found to be functioning normally.  CXR was obtained and demonstrated  findings above, reviewed with Dr. Elberta Fortis, felt satisfactory with no pneumothorax .  Wound care, arm mobility, and restrictions were reviewed with the patient.  The patient was examined by Dr. Elberta Fortis and considered stable for discharge to home.   The patient's discharge medications include an ACE-I/ARB/ARNI. Beta blocker previously held due to hypotension, bradycardia per notes. Given upgrade in device will defer to outpt setting to revisit.  Anticoagulation resumption This patient should resume their Eliquis on 01/03/23 per Dr. Lovena Neighbours request  Physical Exam: Vitals:   12/29/22 0026 12/29/22 0404 12/29/22 0733 12/29/22 1116  BP: (!) 147/74 (!) 125/51 131/65 135/67  Pulse: (!) 59 61 (!) 59 (!) 59  Resp: (!) 22 (!) 22 19 17   Temp: (!) 97.4 F (36.3 C) 98.6 F (37 C) 97.9 F (36.6 C) 98 F (36.7 C)  TempSrc: Oral Oral Oral Oral  SpO2: 100% 98% 99% 98%  Weight:  109.8 kg    Height:      Exam as per MD rounding note  Discharge Medications:  Allergies as of 12/29/2022       Reactions   Metformin And Related Itching, Swelling, Other (See Comments)   Leg pain & swelling in  legs   Ozempic (0.25 Or 0.5 Mg-dose) [semaglutide(0.25 Or 0.5mg -dos)] Other (See Comments)   Pt with hx of pancreatitis    Atorvastatin Calcium Itching, Rash   Levofloxacin Itching        Medication List     PAUSE taking these medications    Eliquis 5 MG Tabs tablet Wait to take this until: January 03, 2023 Generic drug: apixaban TAKE 1  TABLET TWICE DAILY       TAKE these medications    albuterol 108 (90 Base) MCG/ACT inhaler Commonly known as: VENTOLIN HFA Inhale 2 puffs into the lungs 2 (two) times daily as needed for wheezing or shortness of breath.   amiodarone 200 MG tablet Commonly known as: PACERONE Take 0.5 tablets (100 mg total) by mouth daily.   calcitonin (salmon) 200 UNIT/ACT nasal spray Commonly known as: MIACALCIN/FORTICAL Place 1 spray into alternate nostrils daily.   cyanocobalamin 1000 MCG/ML injection Commonly known as: VITAMIN B12 Inject 1,000 mcg into the muscle every 30 (thirty) days.   dapagliflozin propanediol 10 MG Tabs tablet Commonly known as: Farxiga Take 1 tablet (10 mg total) by mouth daily.   Dexcom G7 Sensor Misc 1 Device by Does not apply route as directed.   DropSafe Alcohol Prep 70 % Pads Apply topically.   Entresto 24-26 MG Generic drug: sacubitril-valsartan TAKE 1 TABLET TWICE DAILY   ezetimibe 10 MG tablet Commonly known as: ZETIA Take 10 mg by mouth daily.   ferrous sulfate 325 (65 FE) MG EC tablet Take 325 mg by mouth daily with breakfast.   Insulin Pen Needle 31G X 5 MM Misc 1 Device by Does not apply route in the morning, at noon, in the evening, and at bedtime.   latanoprost 0.005 % ophthalmic solution Commonly known as: XALATAN Place 1 drop into both eyes at bedtime.   nitroGLYCERIN 0.4 MG SL tablet Commonly known as: NITROSTAT PLACE 1 TABLET (0.4 MG TOTAL) UNDER THE TONGUE EVERY 5 (FIVE) MINUTES AS NEEDED FOR CHEST PAIN.   NovoLOG FlexPen 100 UNIT/ML FlexPen Generic drug: insulin aspart INJECT PER SLIDING SCALE AS DIRECTED , MAX 30 UNITS PER DAY. DISCARD PEN 28 DAYS AFTER OPENING What changed:  how much to take how to take this when to take this additional instructions   OXYGEN Inhale 2 L into the lungs continuous.   rosuvastatin 20 MG tablet Commonly known as: CRESTOR Take 1 tablet (20 mg total) by mouth daily at 6 PM.   Evaristo Bury  FlexTouch 100 UNIT/ML FlexTouch Pen Generic drug: insulin degludec INJECT 30 UNITS INTO THE SKIN DAILY.   True Metrix Blood Glucose Test test strip Generic drug: glucose blood TEST THREE TIMES DAILY   Accu-Chek Guide test strip Generic drug: glucose blood Check 3 times daily   Vitamin D (Ergocalciferol) 1.25 MG (50000 UNIT) Caps capsule Commonly known as: DRISDOL Take 1 capsule (50,000 Units total) by mouth every 7 (seven) days.        Disposition: Home with usual follow up as in AVS  Duration of Discharge Encounter: Greater than 30 minutes including physician time.  Signed, Laurann Montana, PA-C  12/29/2022 12:13 PM    I have seen and examined this patient with Ronie Spies.  Agree with above, note added to reflect my findings.  On exam, RRR, no murmurs, lungs clear.  She is now status post ICD generator change with left bundle lead addition.  Device functioning appropriately.  Sensing, threshold, impedance within normal limits as expected post implant.  Chest x-ray  and interrogation without issue.  Plan for discharge today with follow-up in device clinic.  Will M. Camnitz MD 12/30/2022 8:18 AM

## 2022-12-29 NOTE — Plan of Care (Signed)
  Problem: Cardiac: Goal: Ability to achieve and maintain adequate cardiopulmonary perfusion will improve Outcome: Progressing   Problem: Education: Goal: Knowledge of General Education information will improve Description: Including pain rating scale, medication(s)/side effects and non-pharmacologic comfort measures Outcome: Progressing   

## 2022-12-29 NOTE — Discharge Instructions (Addendum)

## 2022-12-29 NOTE — Progress Notes (Signed)
Rounding Note    Patient Name: [name] Date of Encounter: 12/29/2022  [facility] Cardiologist: Donato Schultz, MD   Subjective   No acute complaints.  Post ICD generator change with left bundle area pacing lead implantation  Inpatient Medications    Scheduled Meds:  amiodarone  100 mg Oral Daily   dapagliflozin propanediol  10 mg Oral Daily   ezetimibe  10 mg Oral Daily   insulin aspart  0-9 Units Subcutaneous TID WC   insulin glargine-yfgn  30 Units Subcutaneous QHS   rosuvastatin  20 mg Oral q1800   sacubitril-valsartan  1 tablet Oral BID   Continuous Infusions:  PRN Meds: acetaminophen, ondansetron (ZOFRAN) IV   Vital Signs    Vitals:   12/28/22 2101 12/29/22 0026 12/29/22 0404 12/29/22 0733  BP: 115/63 (!) 147/74 (!) 125/51 131/65  Pulse: 63 (!) 59 61 (!) 59  Resp: 19 (!) 22 (!) 22 19  Temp: 98 F (36.7 C) (!) 97.4 F (36.3 C) 98.6 F (37 C) 97.9 F (36.6 C)  TempSrc: Oral Oral Oral Oral  SpO2: 95% 100% 98% 99%  Weight:   109.8 kg   Height:        Intake/Output Summary (Last 24 hours) at 12/29/2022 1004 Last data filed at 12/28/2022 2108 Gross per 24 hour  Intake --  Output 400 ml  Net -400 ml      12/29/2022    4:04 AM 12/28/2022    5:51 PM 12/28/2022   12:57 PM  Last 3 Weights  Weight (lbs) 242 lb 1 oz 244 lb 0.8 oz 244 lb  Weight (kg) 109.8 kg 110.7 kg 110.678 kg      Telemetry    AV paced- Personally Reviewed  ECG    AV paced- Personally Reviewed  Physical Exam   GEN: No acute distress.   Neck: No JVD Cardiac: RRR, no murmurs, rubs, or gallops.  Respiratory: Clear to auscultation bilaterally. GI: Soft, nontender, non-distended  MS: No edema; No deformity. Neuro:  Nonfocal  Psych: Normal affect   Labs    High Sensitivity Troponin:  No results for input(s): "TROPONINIHS" in the last 720 hours.   ChemistryNo results for input(s): "NA", "K", "CL", "CO2", "GLUCOSE", "BUN", "CREATININE", "CALCIUM", "MG",  "PROT", "ALBUMIN", "AST", "ALT", "ALKPHOS", "BILITOT", "GFRNONAA", "GFRAA", "ANIONGAP" in the last 168 hours.  Lipids No results for input(s): "CHOL", "TRIG", "HDL", "LABVLDL", "LDLCALC", "CHOLHDL" in the last 168 hours.  HematologyNo results for input(s): "WBC", "RBC", "HGB", "HCT", "MCV", "MCH", "MCHC", "RDW", "PLT" in the last 168 hours. Thyroid No results for input(s): "TSH", "FREET4" in the last 168 hours.  BNPNo results for input(s): "BNP", "PROBNP" in the last 168 hours.  DDimer No results for input(s): "DDIMER" in the last 168 hours.   Radiology    EP PPM/ICD IMPLANT Result Date: 12/28/2022 CONCLUSIONS: 1. Venogram of the LUE demonstrating patent left subclavian and axillary veins with a stenosis medial to the first rib. 2. Successful implantation of a left bundle area lead for resynchronization therapy.  EKG after the case demonstrated narrowing of the QRS 138 ms (preprocedure EKG with 160 ms QRS). 3. Successful generator replacement 4. No early apparent complications 5.  Hold Eliquis for 5 days (okay to restart January 03, 2023)    Cardiac Studies     Patient Profile     72 y.o. female admitted to the hospital post ICD generator change with left bundle area pacing lead addition  Assessment & Plan  Chronic systolic heart failure: Post ICD generator change with left bundle area pacing lead addition.  Device functioning appropriately.  Chest x-ray without abnormality.  Not officially read yet by radiology, but if no pneumothorax, Darletta Noblett plan for discharge home.  No medication changes.  Can restart Eliquis on 01/03/2023 per Dr. Lovena Neighbours request.  For questions or updates, please contact [facility] Please consult [email or web address] for contact info under        Signed, Brave Dack Jorja Loa, MD  12/29/2022, 10:04 AM

## 2022-12-29 NOTE — Plan of Care (Signed)
Pt was ready for discharge. Vital stable.

## 2022-12-29 NOTE — Progress Notes (Signed)
Mobility Specialist Progress Note:    12/29/22 1230  Mobility  Activity Transferred to/from [facility]  Level of Assistance Contact guard assist, steadying assist  Assistive Device None  Distance Ambulated (ft) 3 ft  Activity Response Tolerated well  Mobility Referral Yes  Mobility visit 1 Mobility  Mobility Specialist Start Time (ACUTE ONLY) 1140  Mobility Specialist Stop Time (ACUTE ONLY) 1155  Mobility Specialist Time Calculation (min) (ACUTE ONLY) 15 min   [facility] requesting assistance getting back to bed. No physical assistance needed just contact guard for safety. Needed MinA to lift and swing legs back on bed. Situated in bed w/ call bell and personal belongings in reach. All needs met.   Thompson Grayer Mobility Specialist  Please contact vis Secure Chat or  Rehab Office [phone]

## 2022-12-31 ENCOUNTER — Telehealth: Payer: Self-pay

## 2022-12-31 ENCOUNTER — Encounter (HOSPITAL_COMMUNITY): Payer: Self-pay | Admitting: Cardiology

## 2022-12-31 MED FILL — Midazolam HCl Inj 2 MG/2ML (Base Equivalent): INTRAMUSCULAR | Qty: 2 | Status: AC

## 2022-12-31 MED FILL — Gentamicin Sulfate Inj 40 MG/ML: INTRAMUSCULAR | Qty: 80 | Status: AC

## 2022-12-31 NOTE — Telephone Encounter (Signed)
Follow-up after same day discharge: Implant date: 12/28/2022 MD: Lalla Brothers Device: [employer] Unify Assuratiy ICD  Location: Left Chest   Wound check visit: 01/17/2023 90 day MD follow-up: 04/03/2023  Remote Transmission received:n/a  Dressing/sling removed: not yet. She states she will take it off today.  Confirm OAC restart on: Pt starts Eliquis on 01/03/2024.  Please continue to monitor your cardiac device site for redness, swelling, and drainage. Call the device clinic at [phone] if you experience these symptoms, fever/chills, or have questions about your device.   Remote monitoring is used to monitor your cardiac device from home. This monitoring is scheduled every 91 days by our office. It allows [name] to keep an eye on the functioning of your device to ensure it is working properly.

## 2023-01-01 NOTE — Telephone Encounter (Signed)
Spoke with pt she is unable to send today due to her using a machine until one to get the swelling down in her legs. I have let pt know we will call Thursday to try and help her

## 2023-01-03 NOTE — Telephone Encounter (Signed)
Transmission received 01/01/2023.

## 2023-01-05 NOTE — Progress Notes (Signed)
Patient has chronic systolic heart failure (EF25%) with a left bundle branch block and meets criteria for resynchronization therapy upgrade at the time of generator replacement.  Sheria Lang T. Lalla Brothers, MD, [facility], [facility] Cardiac Electrophysiology

## 2023-01-15 ENCOUNTER — Other Ambulatory Visit: Payer: Self-pay

## 2023-01-15 MED ORDER — EZETIMIBE 10 MG PO TABS: 10.0000 mg | ORAL_TABLET | Freq: Every day | ORAL | 3 refills | Status: AC

## 2023-01-17 ENCOUNTER — Ambulatory Visit: Payer: Medicare HMO | Attending: Cardiology

## 2023-01-17 DIAGNOSIS — I472 Ventricular tachycardia, unspecified: Secondary | ICD-10-CM

## 2023-01-17 LAB — CUP PACEART INCLINIC DEVICE CHECK
Battery Remaining Longevity: 80 mo
Brady Statistic RA Percent Paced: 55 %
Brady Statistic RV Percent Paced: 90 %
Date Time Interrogation Session: 20250109152032
HighPow Impedance: 37.3585
Implantable Lead Connection Status: 753985
Implantable Lead Connection Status: 753985
Implantable Lead Connection Status: 753985
Implantable Lead Implant Date: 20080423
Implantable Lead Implant Date: 20140918
Implantable Lead Implant Date: 20241220
Implantable Lead Location: 753858
Implantable Lead Location: 753859
Implantable Lead Location: 753860
Implantable Lead Model: 5076
Implantable Pulse Generator Implant Date: 20241220
Lead Channel Impedance Value: 475 Ohm
Lead Channel Impedance Value: 487.5 Ohm
Lead Channel Impedance Value: 512.5 Ohm
Lead Channel Pacing Threshold Amplitude: 0.375 V
Lead Channel Pacing Threshold Amplitude: 0.625 V
Lead Channel Pacing Threshold Amplitude: 1 V
Lead Channel Pacing Threshold Pulse Width: 0.5 ms
Lead Channel Pacing Threshold Pulse Width: 0.5 ms
Lead Channel Pacing Threshold Pulse Width: 0.5 ms
Lead Channel Sensing Intrinsic Amplitude: 1.1 mV
Lead Channel Sensing Intrinsic Amplitude: 12 mV
Lead Channel Setting Pacing Amplitude: 1.375
Lead Channel Setting Pacing Amplitude: 2 V
Lead Channel Setting Pacing Amplitude: 2 V
Lead Channel Setting Pacing Pulse Width: 0.5 ms
Lead Channel Setting Pacing Pulse Width: 0.5 ms
Lead Channel Setting Sensing Sensitivity: 0.5 mV
Pulse Gen Serial Number: 5567048

## 2023-01-17 NOTE — Patient Instructions (Signed)

## 2023-01-17 NOTE — Progress Notes (Signed)
 Normal ICD wound check. Wound well healed. Thresholds, sensing, and impedances consistent with implant measurements with 3.5V safety margin/auto capture until 3 month visit. No episodes.  Reviewed arm restrictions to continue for 6 weeks total post op. Reviewed shock plan.  Pt enrolled in remote follow-up.

## 2023-01-21 ENCOUNTER — Telehealth: Payer: Self-pay

## 2023-01-21 NOTE — Telephone Encounter (Signed)
 Received a request from [facility] for kyphoplasty. Patient made aware that we cannot clear for this type of a surgical procedure until at least 3 months after her device implant/upgrade.   Patient has a BIV ICD upgrade on 12/28/22. Form faxed back to [facility] with denied clearance for 3 months.   Patient verbalizes understanding.

## 2023-01-25 NOTE — Progress Notes (Signed)
No ICM remote transmission received for 01/22/2023 and next ICM transmission scheduled for 01/29/2023.

## 2023-01-30 ENCOUNTER — Telehealth: Payer: Self-pay

## 2023-01-30 NOTE — Telephone Encounter (Signed)
Attempted ICM call to patient and left message that device monitor is showing as disconnected.  Advised to check connects and send manual transmission.  Advised to call [employer] if unable to send report.

## 2023-01-31 ENCOUNTER — Other Ambulatory Visit: Payer: Self-pay

## 2023-01-31 MED ORDER — DEXCOM G7 SENSOR MISC: 1.0000 | 3 refills | Status: AC

## 2023-01-31 MED ORDER — DEXCOM G7 SENSOR MISC: 1.0000 | 3 refills | Status: DC

## 2023-01-31 NOTE — Telephone Encounter (Signed)
Returned patient call.  She called [employer] due to monitor is not working.  Expected to receive a new monitor on 1/25.  ICM remote transmission rescheduled for 02/04/2023.

## 2023-02-01 ENCOUNTER — Ambulatory Visit (HOSPITAL_COMMUNITY)
Admission: RE | Admit: 2023-02-01 | Discharge: 2023-02-01 | Disposition: A | Discharge status: Home / Self Care | Payer: Medicare HMO | Source: Ambulatory Visit | Attending: Podiatry | Admitting: Podiatry

## 2023-02-01 DIAGNOSIS — I739 Peripheral vascular disease, unspecified: Secondary | ICD-10-CM | POA: Insufficient documentation

## 2023-02-01 DIAGNOSIS — E1151 Type 2 diabetes mellitus with diabetic peripheral angiopathy without gangrene: Secondary | ICD-10-CM | POA: Diagnosis not present

## 2023-02-02 ENCOUNTER — Telehealth: Payer: Self-pay | Admitting: Cardiology

## 2023-02-02 LAB — VAS US ABI WITH/WO TBI
Left ABI: 0.57
Right ABI: 0.82

## 2023-02-02 NOTE — Telephone Encounter (Signed)
Patient called in reporting she has not received her new remote transmission ICD device yet. I advised I would to route to the office to follow up with her on Monday as her next interrogation it due at that time to see if she has received device.

## 2023-02-04 NOTE — Telephone Encounter (Signed)
Looks like remote monitor

## 2023-02-04 NOTE — Telephone Encounter (Signed)
I spoke with the pt and she still has not received her new monitor. She spoke with [employer] and she should receive it in a few days.

## 2023-02-04 NOTE — Telephone Encounter (Signed)
LMOVM for pt to call the device clinic back.

## 2023-02-08 ENCOUNTER — Other Ambulatory Visit: Payer: Self-pay

## 2023-02-08 DIAGNOSIS — E1151 Type 2 diabetes mellitus with diabetic peripheral angiopathy without gangrene: Secondary | ICD-10-CM

## 2023-02-08 DIAGNOSIS — I739 Peripheral vascular disease, unspecified: Secondary | ICD-10-CM

## 2023-02-08 DIAGNOSIS — R6889 Other general symptoms and signs: Secondary | ICD-10-CM

## 2023-02-12 ENCOUNTER — Telehealth: Payer: Self-pay

## 2023-02-12 NOTE — Telephone Encounter (Signed)
Spoke with patient.  Advised [employer] delivered package on 1/17 to her front door.  She never received it.  Provide [employer] phone number and will call to discuss with customer rep.  ICM remote transmission rescheduled for 2/24.

## 2023-02-12 NOTE — Telephone Encounter (Signed)
Received call from patient.  She stated she has not received the new [employer] device monitor and asked for me to follow up.  Advised would call [employer] for follow up.

## 2023-02-12 NOTE — Telephone Encounter (Signed)
 Spoke with [employer] Rep and he confirmed the new monitor was delivered to the address listed for patient on 1/17.  [employer] Tracking number [id number].  Advised will call patient with the information.  He recommended patient call back to place another order if she did not receive it.

## 2023-02-17 ENCOUNTER — Other Ambulatory Visit: Payer: Self-pay | Admitting: Internal Medicine

## 2023-02-18 ENCOUNTER — Ambulatory Visit: Payer: Medicare HMO | Attending: Cardiology

## 2023-02-18 DIAGNOSIS — Z95810 Presence of automatic (implantable) cardiac defibrillator: Secondary | ICD-10-CM

## 2023-02-18 DIAGNOSIS — I5042 Chronic combined systolic (congestive) and diastolic (congestive) heart failure: Secondary | ICD-10-CM

## 2023-02-18 NOTE — Progress Notes (Signed)
 EPIC Encounter for ICM Monitoring  Patient Name: [name] is a 73 y.o. female Date: 02/18/2023 Primary Care Physican: Verlene Glimpse, NP Primary Cardiologist: Skains/Weaver PA  Electrophysiologist: Marven Slimmer 10/04/2021 Weight: 237 lbs 11/06/2021 Weight: 232 lbs 03/05/2022 Weight: 235 lbs 04/16/2022 Weight: 230 lbs 05/15/2022 Weight:  230 lbs 08/29/2022 Weight: 230 lbs 11/21/2022 Office Weight: 237 lbs 02/18/2023 Weight: 230 lbs    AT/AF Burden: 0% (taking Eliquis )                                                           Spoke with patient and heart failure questions reviewed.  Transmission results reviewed.  Pt asymptomatic for fluid accumulation.  Reports feeling well at this time and voices no complaints.      CorVue thoracic impedance suggesting normal fluid levels within the last month.      Prescribed: No diuretic since 04/2022 hospital discharge   Labs: 11/21/2022 Creatinine 0.88, BUN 17, Potassium 4.1, Sodium 140, GFR 70 08/31/2022 Creatinine 0.82, BUN 14, Potassium 5.6, Sodium 138, GFR >60 06/05/2022 Creatinine 1.01, BUN 17, Potassium 4.1, Sodium 141, GFR 59 05/01/2022 Creatinine 1.19, BUN 24, Potassium 4.8, Sodium 134, GFR 49  04/30/2022 Creatinine 1.43, BUN 19, Potassium 4.7, Sodium 137, GFR 39  04/29/2022 Creatinine 1.23, BUN 18, Potassium 4.6, Sodium 135, GFR 47  04/28/2022 Creatinine 1.33, BUN 21, Potassium 3.3, Sodium 137, GFR 43 04/27/2022 Creatinine 1.42, BUN 24, Potassium 5.3, Sodium 140, GFR 39  A complete set of results can be found in Results Review.   Recommendations:   No changes and encouraged to call if experiencing any fluid symptoms.   Next ICM clinic phone appointment due:  04/02/2023.     Next 91 day remote transmission due: 04/01/2023     EP/Cardiology Office Visits:   04/03/2023 with Creighton Doffing, NP.  12/12/2022 with Dr Renna Cary.   Copy of ICM check sent to Dr. Marven Slimmer.  3 month ICM trend: 02/15/2023.    12-14 Month ICM trend:     Almyra Jain, RN 02/18/2023 4:02 PM

## 2023-03-08 ENCOUNTER — Other Ambulatory Visit: Payer: Self-pay | Admitting: *Deleted

## 2023-03-08 DIAGNOSIS — M79672 Pain in left foot: Secondary | ICD-10-CM

## 2023-03-15 ENCOUNTER — Ambulatory Visit (HOSPITAL_COMMUNITY)
Admission: RE | Admit: 2023-03-15 | Discharge: 2023-03-15 | Disposition: A | Discharge status: Home / Self Care | Payer: Medicare HMO | Source: Ambulatory Visit | Attending: Vascular Surgery | Admitting: Vascular Surgery

## 2023-03-15 ENCOUNTER — Ambulatory Visit: Payer: Medicare HMO | Admitting: Physician Assistant

## 2023-03-15 VITALS — BP 169/79 | HR 60 | Temp 97.7°F | Resp 22 | Ht 63.0 in | Wt 243.2 lb

## 2023-03-15 DIAGNOSIS — I739 Peripheral vascular disease, unspecified: Secondary | ICD-10-CM | POA: Diagnosis not present

## 2023-03-15 DIAGNOSIS — M79672 Pain in left foot: Secondary | ICD-10-CM

## 2023-03-15 NOTE — Progress Notes (Signed)
 [facility] [place] HISTORY AND PHYSICAL   History of Present Illness:  Patient is a 73 y.o. year old female who presents for evaluation of PAD with a CC of right forefoot being"ice cold" to touch.  She denies rest pain, short distance claudication and non healing wounds.  She was last seen by Dr. Edilia Bo in 2023.   Her Doppler study was a monophasic dorsalis pedis signal on the left suggesting some mild infrainguinal arterial occlusive disease on the left. However she had a biphasic posterior tibial signal bilaterally ABIs of 100% bilaterally and toe pressures which were reasonable on both sides.  She has falsely elevated index with calcified vessels.        She is medically managed with a daily Statin, Eliquis and Zetia.   Past Medical History:  Diagnosis Date   Abnormal LFTs    AICD (automatic cardioverter/defibrillator) present    Angina decubitus (HCC) 11/06/2012   Angina pectoris (HCC)    Anxiety    Arthritis    Back pain    Bundle branch block 05/2016   CAD (coronary artery disease) 06/18/2010   CHF (congestive heart failure) (HCC)    Chronic combined systolic and diastolic CHF, NYHA class 3 (HCC)    Chronic combined systolic and diastolic heart failure (HCC)    Chronic systolic dysfunction of left ventricle 09/24/2012   Coronary artery disease    s/p CABG 2007   Depression    Dermal mycosis    Diabetes mellitus    type II   DKA (diabetic ketoacidoses) 01/12/2019   DVT (deep venous thrombosis) (HCC) 09/2015   bilateral   Edema, lower extremity    Glaucoma    Hyperlipidemia    Hypertension    Hypoglycemia 09/09/2012   Insomnia    Insulin dependent diabetes mellitus (HCC) 04/23/2012   Ischemic cardiomyopathy    EF 30-35%, s/p ICD 4/08 by Dr Amil Amen   Joint pain    LBBB (left bundle branch block) 09/24/2012   Morbid obesity (HCC) 11/06/2012   Obesity    Oxygen dependent 2 L   Pain in left knee    Peripheral neuropathy    Peripheral vascular  disease (HCC)    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   LIMA to D2, SVG to ramus intermediate, sequential SVG to OM1-OM2, SVG to RCA with EVH via both legs    Sleep apnea    uses O2 at night   Tinea    Type 2 diabetes mellitus with hyperglycemia, with long-term current use of insulin (HCC) 04/20/2019   Uncontrolled diabetes mellitus 02/2019   Ventricular tachycardia (HCC) 01/16/2015   sustained VT terminated with ATP, CL 340 msec   Vitamin B 12 deficiency 11/05/2018   Vitamin D deficiency 11/05/2018    Past Surgical History:  Procedure Laterality Date   BI-VENTRICULAR IMPLANTABLE CARDIOVERTER DEFIBRILLATOR UPGRADE N/A 09/25/2012   Procedure: BI-VENTRICULAR IMPLANTABLE CARDIOVERTER DEFIBRILLATOR UPGRADE;  Surgeon: Gardiner Rhyme, MD;  Location: [facility] CATH LAB;  Service: Cardiovascular;  Laterality: N/A;   BIV ICD GENERATOR CHANGEOUT N/A 12/11/2022   Procedure: BIV ICD GENERATOR CHANGEOUT;  Surgeon: Lanier Prude, MD;  Location: [facility] INVASIVE CV LAB;  Service: Cardiovascular;  Laterality: N/A;   BIV ICD GENERATOR CHANGEOUT N/A 12/28/2022   Procedure: BIV ICD GENERATOR CHANGEOUT;  Surgeon: Lanier Prude, MD;  Location: [facility] INVASIVE CV LAB;  Service: Cardiovascular;  Laterality: N/A;   BIV UPGRADE Left 12/28/2022   Procedure: BIV UPGRADE;  Surgeon: Lanier Prude, MD;  Location: [facility] INVASIVE CV LAB;  Service: Cardiovascular;  Laterality: Left;   BREAST BIOPSY Right 04/05/2014   CARDIAC CATHETERIZATION     CARDIAC DEFIBRILLATOR PLACEMENT  4/08   by Dr Amil Amen (MDT)   COLONOSCOPY WITH PROPOFOL N/A 10/12/2016   Procedure: COLONOSCOPY WITH PROPOFOL;  Surgeon: Sherrilyn Rist, MD;  Location: [facility] ENDOSCOPY;  Service: Gastroenterology;  Laterality: N/A;   CORONARY ARTERY BYPASS GRAFT  10/05/2005   by Dr Laneta Simmers   IMPLANTABLE CARDIOVERTER DEFIBRILLATOR IMPLANT  09/25/12   attempt of upgrade to CRT-D unsuccessful due to CS anatomy, SJM Unify Asaura device placed with LV port capped by Dr Johney Frame   IR  GENERIC HISTORICAL  10/03/2015   IR US GUIDE VASC ACCESS LEFT 10/03/2015 Malachy Moan, MD [facility]   IR GENERIC HISTORICAL  10/03/2015   IR VENO/EXT/UNI LEFT 10/03/2015 Malachy Moan, MD [facility]   IR GENERIC HISTORICAL  10/03/2015   IR VENOCAVAGRAM IVC 10/03/2015 Malachy Moan, MD [facility]   IR GENERIC HISTORICAL  10/03/2015   IR INFUSION THROMBOL VENOUS INITIAL (MS) 10/03/2015 Malachy Moan, MD [facility]   IR GENERIC HISTORICAL  10/03/2015   IR US GUIDE VASC ACCESS LEFT 10/03/2015 Malachy Moan, MD [facility]   IR GENERIC HISTORICAL  10/04/2015   IR THROMBECT VENO MECH MOD SED 10/04/2015 Simonne Come, MD [facility]   IR GENERIC HISTORICAL  10/04/2015   IR TRANSCATH PLC STENT 1ST ART NOT LE CV CAR VERT CAR 10/04/2015 Simonne Come, MD [facility]   IR GENERIC HISTORICAL  10/04/2015   IR THROMB F/U EVAL ART/VEN FINAL DAY (MS) 10/04/2015 Simonne Come, MD [facility]   IR GENERIC HISTORICAL  11/02/2015   IR RADIOLOGIST EVAL & MGMT 11/02/2015 Simonne Come, MD [facility] INTER[facility] RAD   IR RADIOLOGIST EVAL & MGMT  04/26/2016   LEFT HEART CATH AND CORS/GRAFTS ANGIOGRAPHY N/A 01/19/2019   Procedure: LEFT HEART CATH AND CORS/GRAFTS ANGIOGRAPHY;  Surgeon: Lyn Records, MD;  Location: [facility] INVASIVE CV LAB;  Service: Cardiovascular;  Laterality: N/A;   LEFT HEART CATH AND CORS/GRAFTS ANGIOGRAPHY N/A 01/15/2022   Procedure: LEFT HEART CATH AND CORS/GRAFTS ANGIOGRAPHY;  Surgeon: Runell Gess, MD;  Location: [facility] INVASIVE CV LAB;  Service: Cardiovascular;  Laterality: N/A;    ROS:   General:  No weight loss, Fever, chills  HEENT: No recent headaches, no nasal bleeding, no visual changes, no sore throat  Neurologic: No dizziness, blackouts, seizures. No recent symptoms of stroke or mini- stroke. No recent episodes of slurred speech, or temporary blindness.  Cardiac: No recent episodes of chest pain/pressure, no shortness of breath at rest.  No shortness of breath with exertion.  Denies  history of atrial fibrillation or irregular heartbeat  Vascular: No history of rest pain in feet.  No history of claudication.  No history of non-healing ulcer, No history of DVT   Pulmonary: No home oxygen, no productive cough, no hemoptysis,  No asthma or wheezing  Musculoskeletal:  [ ]  Arthritis, [ ]  Low back pain,  [ ]  Joint pain  Hematologic:No history of hypercoagulable state.  No history of easy bleeding.  No history of anemia  Gastrointestinal: No hematochezia or melena,  No gastroesophageal reflux, no trouble swallowing  Urinary: [ ]  chronic Kidney disease, [ ]  on HD - [ ]  MWF or [ ]  TTHS, [ ]  Burning with urination, [ ]  Frequent urination, [ ]  Difficulty urinating;   Skin: No rashes  Psychological: No history of anxiety,  No history of  depression  Social History Social History   Tobacco Use   Smoking status: Never   Smokeless tobacco: Never  Vaping Use   Vaping status: Never Used  Substance Use Topics   Alcohol use: No   Drug use: No    Family History Family History  Problem Relation Age of Onset   Diabetes Mother 26       died - HTN   Stroke Mother    High blood pressure Mother    Sudden death Mother    Obesity Mother    Other Other        No early family hx of CAD    Allergies  Allergies  Allergen Reactions   Metformin And Related Itching, Swelling and Other (See Comments)    Leg pain & swelling in legs   Ozempic (0.25 Or 0.5 Mg-Dose) [Semaglutide(0.25 Or 0.5mg -Dos)] Other (See Comments)    Pt with hx of pancreatitis    Atorvastatin Calcium Itching and Rash   Levofloxacin Itching     Current Outpatient Medications  Medication Sig Dispense Refill   albuterol (VENTOLIN HFA) 108 (90 Base) MCG/ACT inhaler Inhale 2 puffs into the lungs 2 (two) times daily as needed for wheezing or shortness of breath.     Alcohol Swabs (DROPSAFE ALCOHOL PREP) 70 % PADS Apply topically.     amiodarone (PACERONE) 200 MG tablet Take 0.5 tablets (100 mg total) by mouth  daily. 45 tablet 3   apixaban (ELIQUIS) 5 MG TABS tablet TAKE 1 TABLET TWICE DAILY 180 tablet 3   calcitonin, salmon, (MIACALCIN/FORTICAL) 200 UNIT/ACT nasal spray Place 1 spray into alternate nostrils daily.     Continuous Glucose Sensor (DEXCOM G7 SENSOR) MISC 1 Device by Does not apply route as directed. 9 each 3   cyanocobalamin (,VITAMIN B-12,) 1000 MCG/ML injection Inject 1,000 mcg into the muscle every 30 (thirty) days.     dapagliflozin propanediol (FARXIGA) 10 MG TABS tablet TAKE 1 TABLET EVERY DAY 90 tablet 1   ENTRESTO 24-26 MG TAKE 1 TABLET TWICE DAILY 180 tablet 3   ezetimibe (ZETIA) 10 MG tablet Take 1 tablet (10 mg total) by mouth daily. 90 tablet 3   ferrous sulfate 325 (65 FE) MG EC tablet Take 325 mg by mouth daily with breakfast.     glucose blood (ACCU-CHEK GUIDE) test strip Check 3 times daily 100 each 12   Insulin Pen Needle 31G X 5 MM MISC 1 Device by Does not apply route in the morning, at noon, in the evening, and at bedtime. 400 each 3   latanoprost (XALATAN) 0.005 % ophthalmic solution Place 1 drop into both eyes at bedtime.     nitroGLYCERIN (NITROSTAT) 0.4 MG SL tablet PLACE 1 TABLET (0.4 MG TOTAL) UNDER THE TONGUE EVERY 5 (FIVE) MINUTES AS NEEDED FOR CHEST PAIN. 75 tablet 3   NOVOLOG FLEXPEN 100 UNIT/ML FlexPen INJECT PER SLIDING SCALE AS DIRECTED , MAX 30 UNITS PER DAY. DISCARD PEN 28 DAYS AFTER OPENING (Patient taking differently: Inject 0-4 Units into the skin 3 (three) times daily with meals. INJECT PER SLIDING SCALE AS DIRECTED ,  MAX 30 UNITS PER DAY. DISCARD PEN 28 DAYS AFTER OPENING) 15 mL 3   OXYGEN Inhale 2 L into the lungs continuous.     rosuvastatin (CRESTOR) 20 MG tablet Take 1 tablet (20 mg total) by mouth daily at 6 PM. 30 tablet 2   TRESIBA FLEXTOUCH 100 UNIT/ML FlexTouch Pen INJECT 30 UNITS INTO THE SKIN DAILY. 30 mL 3  TRUE METRIX BLOOD GLUCOSE TEST test strip TEST THREE TIMES DAILY 300 strip 3   Vitamin D, Ergocalciferol, (DRISDOL) 1.25 MG (50000  UT) CAPS capsule Take 1 capsule (50,000 Units total) by mouth every 7 (seven) days. 4 capsule 0   No current facility-administered medications for this visit.    Physical Examination  Vitals:   03/15/23 1421  BP: (!) 169/79  Pulse: 60  Resp: (!) 22  Temp: 97.7 F (36.5 C)  TempSrc: Temporal  SpO2: 96%  Weight: 243 lb 3.2 oz (110.3 kg)  Height: 5\' 3"  (1.6 m)    Body mass index is 43.08 kg/m.  General:  Alert and oriented, no acute distress HEENT: Normal Neck: No bruit or JVD Pulmonary: Clear to auscultation bilaterally Cardiac: Regular Rate and Rhythm without murmur Abdomen: Soft, non-tender, non-distended, no mass, no scars Skin: No rash, her right forefoot is very cold to touch, intact motor and doppler signals in the DP are present  Musculoskeletal: No deformity   Neurologic: Upper and lower extremity motor grossly intact and symmetric  DATA:   ABI Findings:  +---------+------------------+-----+----------+--------+  Right   Rt Pressure (mmHg)IndexWaveform  Comment   +---------+------------------+-----+----------+--------+  Brachial 151                                        +---------+------------------+-----+----------+--------+  ATA     124               0.82 monophasic          +---------+------------------+-----+----------+--------+  PTA     254               1.67 monophasic          +---------+------------------+-----+----------+--------+  Great Toe98                0.64 Abnormal            +---------+------------------+-----+----------+--------+   +---------+------------------+-----+----------+-------+  Left    Lt Pressure (mmHg)IndexWaveform  Comment  +---------+------------------+-----+----------+-------+  Brachial 152                                       +---------+------------------+-----+----------+-------+  ATA     87                0.57 monophasic          +---------+------------------+-----+----------+-------+  PTA     254               1.67 monophasic         +---------+------------------+-----+----------+-------+  Great Toe95                0.62 Abnormal           +---------+------------------+-----+----------+-------+   +-------+-----------+-----------+------------+------------+  ABI/TBIToday's ABIToday's TBIPrevious ABIPrevious TBI  +-------+-----------+-----------+------------+------------+  Right .82        .64        1.13        .50           +-------+-----------+-----------+------------+------------+  Left  .57        .62        1.13        .34           +-------+-----------+-----------+------------+------------+   Evidence of medial calcification, bilaterally.  Bilateral ABIs appear decreased compared to prior study  on 02/01/21.  Bilateral TBIs appear increased compared to prior study on 02/01/21.  Suggest LEA duplex if clinically indicated.    Summary:  Right: Resting right ankle-brachial index indicates mild right lower  extremity arterial disease. The right toe-brachial index is abnormal.   Left: Resting left ankle-brachial index indicates moderate left lower  extremity arterial disease. The left toe-brachial index is abnormal.   ASSESSMENT/PLAN:  She has known PAD with mixed venous insufficiency.  Her chief complaint is a cold foot on the right with intact motor and no wounds.  She sees a DPM and they were also concerned and asked her to be seen by [name] today.     She denies non healing wounds.  Her most recent ABI was done in Jan 2025.   She has had an increase in TBI's, with a decrease in the ABI on the left> the right with calcified vessels.  I am curious about her presentation and will order arterial studies with ABI's for follow up at our next available f/u.  She currently has no symptoms of ischemia.  I did ask her to try and move more even if it is seated exercises.  She agrees with this  plan.       Mosetta Pigeon PA-C [facility] [place] Office: [phone]  MD in clinic [place]

## 2023-03-19 ENCOUNTER — Ambulatory Visit: Payer: Medicare HMO | Admitting: Student

## 2023-03-19 ENCOUNTER — Other Ambulatory Visit (HOSPITAL_COMMUNITY): Payer: Self-pay

## 2023-03-21 ENCOUNTER — Other Ambulatory Visit: Payer: Self-pay | Admitting: *Deleted

## 2023-03-21 DIAGNOSIS — I739 Peripheral vascular disease, unspecified: Secondary | ICD-10-CM

## 2023-03-21 NOTE — Progress Notes (Signed)
 [id number]

## 2023-04-01 ENCOUNTER — Ambulatory Visit (INDEPENDENT_AMBULATORY_CARE_PROVIDER_SITE_OTHER): Payer: Medicare HMO

## 2023-04-01 DIAGNOSIS — I5042 Chronic combined systolic (congestive) and diastolic (congestive) heart failure: Secondary | ICD-10-CM | POA: Diagnosis not present

## 2023-04-01 DIAGNOSIS — I255 Ischemic cardiomyopathy: Secondary | ICD-10-CM

## 2023-04-02 ENCOUNTER — Telehealth: Payer: Self-pay

## 2023-04-02 ENCOUNTER — Ambulatory Visit: Payer: Medicare HMO | Attending: Cardiology

## 2023-04-02 DIAGNOSIS — Z95810 Presence of automatic (implantable) cardiac defibrillator: Secondary | ICD-10-CM | POA: Diagnosis not present

## 2023-04-02 DIAGNOSIS — I5042 Chronic combined systolic (congestive) and diastolic (congestive) heart failure: Secondary | ICD-10-CM | POA: Diagnosis not present

## 2023-04-02 NOTE — Progress Notes (Unsigned)
 Electrophysiology Office Note:   Date:  04/03/2023  ID:  [name], DOB 1950/11/23, MRN [id number]  Primary Cardiologist: Donato Schultz, MD Primary Heart Failure: None Electrophysiologist: Lanier Prude, MD       History of Present Illness:   [name] is a 73 y.o. female with h/o ICM s/p ICD, VT/VF, LBBB, HFrEF seen today for routine electrophysiology follow up post generator change.    Since last being seen in our clinic the patient reports she has healed up well post generator change. She notes she feels this device is larger and sticks up more.  She notes she will have occasional pain at the device site.   She denies chest pain, palpitations, dyspnea, PND, orthopnea, nausea, vomiting, dizziness, syncope, edema, weight gain, or early satiety.   Review of systems complete and found to be negative unless listed in HPI.   EP Information / Studies Reviewed:    EKG is ordered today. Personal review as below.  EKG Interpretation Date/Time:  Wednesday April 03 2023 13:39:04 EDT Ventricular Rate:  67 PR Interval:    QRS Duration:  142 QT Interval:  538 QTC Calculation: 568 R Axis:   -56  Text Interpretation: AV dual-paced rhythm with frequent Premature ventricular complexes Biventricular pacemaker detected Confirmed by Canary Brim ([id number]) on 04/03/2023 1:57:36 PM   ICD Interrogation-  reviewed in detail today,  See PACEART report.  Device History: Abbott Dual Chamber ICD (CRT device with LV port plugged) implanted 05/01/2006 for ICM / HFrEF. LV lead attempted but unsuccessful as no suitable veins for LV lead Generator Change: 09/25/12, 12/28/22 History of appropriate therapy: Yes 01/2019, VF 01/2022  History of AAD therapy: Yes; currently on amiodarone    Studies:  ECHO 07/2022 > LVEF 25-30%, LBBB, G1DD, LA mildly dilated Generator Change 12/28/22 > venogram of the LUE demonstrating patent L [place] and axillary veins with a stenosis medial to the first rib, successful  implantation of a LBA lead for resynchronization therapy (pre-procedure QRS 160 ms, post 138 ms)  Risk Assessment/Calculations:    CHA2DS2-VASc Score = 6   This indicates a 9.7% annual risk of stroke. The patient's score is based upon: CHF History: 1 HTN History: 1 Diabetes History: 1 Stroke History: 0 Vascular Disease History: 1 Age Score: 1 Gender Score: 1             Physical Exam:   VS:  BP 131/72   Pulse 67   Ht 5\' 3"  (1.6 m)   Wt 230 lb (104.3 kg)   SpO2 93%   BMI 40.74 kg/m    Wt Readings from Last 3 Encounters:  04/03/23 230 lb (104.3 kg)  03/15/23 243 lb 3.2 oz (110.3 kg)  12/29/22 242 lb 1 oz (109.8 kg)     GEN: Well nourished, well developed in no acute distress NECK: No JVD; No carotid bruits CARDIAC: Regular rate and rhythm, no murmurs, rubs, gallops RESPIRATORY:  Clear to auscultation without rales, wheezing or rhonchi  ABDOMEN: Soft, non-tender, non-distended EXTREMITIES:  No edema; No deformity   ASSESSMENT AND PLAN:    Chronic Combined Systolic & Diastolic Heart Failure s/p Abbott CRT-D  VT/VF -euvolemic today -94% BiV pacing with 4% PVC's -EKG with QRS -Stable on an appropriate medical regimen -Normal ICD function -See Pace Art report -No changes today  High Risk Drug Monitoring: Amiodarone  -continue amiodarone  -labs in 11/2022 wnl > follow up amio labs at next visit  Back Pain  -per Dr. Shon Baton  Disposition:   Follow up with Dr. Lalla Brothers or EP APP in 6 months   Signed, Canary Brim, NP-C, AGACNP-BC [facility] - Electrophysiology  04/03/2023, 5:19 PM

## 2023-04-02 NOTE — Telephone Encounter (Signed)
 Remote ICM transmission received.  Attempted call to patient regarding ICM remote transmission and left detailed message per DPR.  Left ICM phone number and advised to return call for any fluid symptoms or questions. Next ICM remote transmission scheduled 05/06/2023.

## 2023-04-02 NOTE — Progress Notes (Signed)
 EPIC Encounter for ICM Monitoring  Patient Name: [name] is a 73 y.o. female Date: 04/02/2023 Primary Care Physican: Elie Confer, NP Primary Cardiologist: Skains/Weaver PA  Electrophysiologist: Lalla Brothers 10/04/2021 Weight: 237 lbs 11/06/2021 Weight: 232 lbs 03/05/2022 Weight: 235 lbs 04/16/2022 Weight: 230 lbs 05/15/2022 Weight:  230 lbs 08/29/2022 Weight: 230 lbs 11/21/2022 Office Weight: 237 lbs 02/18/2023 Weight: 230 lbs    AT/AF Burden: 0% (taking Eliquis)                                                           Attempted call to patient and unable to reach.  Left detailed message per DPR regarding transmission.  Transmission results reviewed.   CorVue thoracic impedance suggesting intermittent days with possible fluid accumulation within the last month.      Prescribed: No diuretic since 04/2022 hospital discharge   Labs: 12/13/2022 Creatinine 1.82, BUN 16, Potassium 4.3, Sodium 144, GFR 76 11/21/2022 Creatinine 0.88, BUN 17, Potassium 4.1, Sodium 140, GFR 70 08/31/2022 Creatinine 0.82, BUN 14, Potassium 5.6, Sodium 138, GFR >60 06/05/2022 Creatinine 1.01, BUN 17, Potassium 4.1, Sodium 141, GFR 59 A complete set of results can be found in Results Review.   Recommendations:   Left voice mail with ICM number and encouraged to call if experiencing any fluid symptoms.   Next ICM clinic phone appointment due:  05/06/2023.     Next 91 day remote transmission due: 07/01/2023     EP/Cardiology Office Visits:   04/03/2023 with Canary Brim, NP.  12/12/2022 with Dr Anne Fu.   Copy of ICM check sent to Dr. Lalla Brothers.  3 month ICM trend: 04/01/2023.    12-14 Month ICM trend:     Karie Soda, RN 04/02/2023 10:11 AM

## 2023-04-02 NOTE — Progress Notes (Signed)
Spoke with patient and heart failure questions reviewed.  Transmission results reviewed.  Pt asymptomatic for fluid accumulation.  Reports feeling well at this time and voices no complaints.  No changes and encouraged to call if experiencing any fluid symptoms. 

## 2023-04-03 ENCOUNTER — Ambulatory Visit: Payer: Medicare HMO | Attending: Pulmonary Disease | Admitting: Pulmonary Disease

## 2023-04-03 ENCOUNTER — Encounter: Payer: Medicare HMO | Admitting: Student

## 2023-04-03 ENCOUNTER — Encounter: Payer: Self-pay | Admitting: Pulmonary Disease

## 2023-04-03 VITALS — BP 131/72 | HR 67 | Ht 63.0 in | Wt 230.0 lb

## 2023-04-03 DIAGNOSIS — Z79899 Other long term (current) drug therapy: Secondary | ICD-10-CM

## 2023-04-03 DIAGNOSIS — I472 Ventricular tachycardia, unspecified: Secondary | ICD-10-CM

## 2023-04-03 DIAGNOSIS — I5042 Chronic combined systolic (congestive) and diastolic (congestive) heart failure: Secondary | ICD-10-CM | POA: Diagnosis not present

## 2023-04-03 DIAGNOSIS — Z95810 Presence of automatic (implantable) cardiac defibrillator: Secondary | ICD-10-CM

## 2023-04-03 DIAGNOSIS — I4901 Ventricular fibrillation: Secondary | ICD-10-CM | POA: Diagnosis not present

## 2023-04-03 LAB — CUP PACEART REMOTE DEVICE CHECK
Battery Remaining Longevity: 79 mo
Battery Remaining Percentage: 89 %
Battery Voltage: 3.13 V
Brady Statistic AP VP Percent: 73 %
Brady Statistic AP VS Percent: 1 %
Brady Statistic AS VP Percent: 21 %
Brady Statistic AS VS Percent: 1 %
Brady Statistic RA Percent Paced: 71 %
Date Time Interrogation Session: 20250324155000
HighPow Impedance: 39 Ohm
HighPow Impedance: 39 Ohm
Implantable Lead Connection Status: 753985
Implantable Lead Connection Status: 753985
Implantable Lead Connection Status: 753985
Implantable Lead Implant Date: 20080423
Implantable Lead Implant Date: 20140918
Implantable Lead Implant Date: 20241220
Implantable Lead Location: 753858
Implantable Lead Location: 753859
Implantable Lead Location: 753860
Implantable Lead Model: 5076
Implantable Pulse Generator Implant Date: 20241220
Lead Channel Impedance Value: 390 Ohm
Lead Channel Impedance Value: 480 Ohm
Lead Channel Impedance Value: 480 Ohm
Lead Channel Pacing Threshold Amplitude: 0.375 V
Lead Channel Pacing Threshold Amplitude: 0.75 V
Lead Channel Pacing Threshold Amplitude: 0.875 V
Lead Channel Pacing Threshold Pulse Width: 0.5 ms
Lead Channel Pacing Threshold Pulse Width: 0.5 ms
Lead Channel Pacing Threshold Pulse Width: 0.5 ms
Lead Channel Sensing Intrinsic Amplitude: 1.5 mV
Lead Channel Sensing Intrinsic Amplitude: 11.1 mV
Lead Channel Setting Pacing Amplitude: 1.375
Lead Channel Setting Pacing Amplitude: 2 V
Lead Channel Setting Pacing Amplitude: 2 V
Lead Channel Setting Pacing Pulse Width: 0.5 ms
Lead Channel Setting Pacing Pulse Width: 0.5 ms
Lead Channel Setting Sensing Sensitivity: 0.5 mV
Pulse Gen Serial Number: 5567048

## 2023-04-03 LAB — CUP PACEART INCLINIC DEVICE CHECK
Battery Remaining Longevity: 81 mo
Brady Statistic RA Percent Paced: 71 %
Brady Statistic RV Percent Paced: 94 %
Date Time Interrogation Session: 20250326172012
HighPow Impedance: 41.846
Implantable Lead Connection Status: 753985
Implantable Lead Connection Status: 753985
Implantable Lead Connection Status: 753985
Implantable Lead Implant Date: 20080423
Implantable Lead Implant Date: 20140918
Implantable Lead Implant Date: 20241220
Implantable Lead Location: 753858
Implantable Lead Location: 753859
Implantable Lead Location: 753860
Implantable Lead Model: 5076
Implantable Pulse Generator Implant Date: 20241220
Lead Channel Impedance Value: 437.5 Ohm
Lead Channel Impedance Value: 475 Ohm
Lead Channel Impedance Value: 537.5 Ohm
Lead Channel Pacing Threshold Amplitude: 0.625 V
Lead Channel Pacing Threshold Amplitude: 0.75 V
Lead Channel Pacing Threshold Amplitude: 0.875 V
Lead Channel Pacing Threshold Pulse Width: 0.4 ms
Lead Channel Pacing Threshold Pulse Width: 0.4 ms
Lead Channel Pacing Threshold Pulse Width: 0.4 ms
Lead Channel Sensing Intrinsic Amplitude: 1.2 mV
Lead Channel Sensing Intrinsic Amplitude: 12 mV
Lead Channel Setting Pacing Amplitude: 1.625
Lead Channel Setting Pacing Amplitude: 2 V
Lead Channel Setting Pacing Amplitude: 2 V
Lead Channel Setting Pacing Pulse Width: 0.4 ms
Lead Channel Setting Pacing Pulse Width: 0.4 ms
Lead Channel Setting Sensing Sensitivity: 0.5 mV
Pulse Gen Serial Number: 5567048

## 2023-04-03 NOTE — Patient Instructions (Signed)
 Medication Instructions:  Your physician recommends that you continue on your current medications as directed. Please refer to the Current Medication list given to you today.  *If you need a refill on your cardiac medications before your next appointment, please call your pharmacy*  Lab Work: None ordered If you have labs (blood work) drawn today and your tests are completely normal, you will receive your results only by: MyChart Message (if you have MyChart) OR A paper copy in the mail If you have any lab test that is abnormal or we need to change your treatment, we will call you to review the results.  Follow-Up: At [facility], you and your health needs are our priority.  As part of our continuing mission to provide you with exceptional heart care, we have created designated Provider Care Teams.  These Care Teams include your primary Cardiologist (physician) and Advanced Practice Providers (APPs -  Physician Assistants and Nurse Practitioners) who all work together to provide you with the care you need, when you need it.  Your next appointment:   6 month(s)  Provider:   Canary Brim, NP

## 2023-04-06 ENCOUNTER — Encounter: Payer: Self-pay | Admitting: Cardiology

## 2023-04-15 ENCOUNTER — Encounter (HOSPITAL_COMMUNITY): Payer: Self-pay

## 2023-04-15 ENCOUNTER — Ambulatory Visit (INDEPENDENT_AMBULATORY_CARE_PROVIDER_SITE_OTHER)
Admission: RE | Admit: 2023-04-15 | Discharge: 2023-04-15 | Disposition: A | Discharge status: Home / Self Care | Source: Ambulatory Visit | Attending: Surgery | Admitting: Surgery

## 2023-04-15 ENCOUNTER — Other Ambulatory Visit: Payer: Self-pay

## 2023-04-15 ENCOUNTER — Emergency Department (HOSPITAL_COMMUNITY)

## 2023-04-15 ENCOUNTER — Inpatient Hospital Stay (HOSPITAL_COMMUNITY)
Admission: EM | Admit: 2023-04-15 | Discharge: 2023-04-23 | DRG: 493 | Disposition: A | Discharge status: Skilled Nursing Facility | Attending: Internal Medicine | Admitting: Internal Medicine

## 2023-04-15 DIAGNOSIS — S93431A Sprain of tibiofibular ligament of right ankle, initial encounter: Secondary | ICD-10-CM | POA: Diagnosis present

## 2023-04-15 DIAGNOSIS — S82899A Other fracture of unspecified lower leg, initial encounter for closed fracture: Secondary | ICD-10-CM | POA: Diagnosis present

## 2023-04-15 DIAGNOSIS — S82851A Displaced trimalleolar fracture of right lower leg, initial encounter for closed fracture: Principal | ICD-10-CM | POA: Diagnosis present

## 2023-04-15 DIAGNOSIS — I2510 Atherosclerotic heart disease of native coronary artery without angina pectoris: Secondary | ICD-10-CM | POA: Diagnosis present

## 2023-04-15 DIAGNOSIS — G4730 Sleep apnea, unspecified: Secondary | ICD-10-CM | POA: Diagnosis present

## 2023-04-15 DIAGNOSIS — Z79899 Other long term (current) drug therapy: Secondary | ICD-10-CM

## 2023-04-15 DIAGNOSIS — Z8249 Family history of ischemic heart disease and other diseases of the circulatory system: Secondary | ICD-10-CM

## 2023-04-15 DIAGNOSIS — I447 Left bundle-branch block, unspecified: Secondary | ICD-10-CM | POA: Diagnosis present

## 2023-04-15 DIAGNOSIS — I10 Essential (primary) hypertension: Secondary | ICD-10-CM | POA: Diagnosis present

## 2023-04-15 DIAGNOSIS — S82891A Other fracture of right lower leg, initial encounter for closed fracture: Secondary | ICD-10-CM | POA: Diagnosis not present

## 2023-04-15 DIAGNOSIS — W1830XA Fall on same level, unspecified, initial encounter: Secondary | ICD-10-CM | POA: Diagnosis present

## 2023-04-15 DIAGNOSIS — Z951 Presence of aortocoronary bypass graft: Secondary | ICD-10-CM

## 2023-04-15 DIAGNOSIS — H409 Unspecified glaucoma: Secondary | ICD-10-CM | POA: Diagnosis present

## 2023-04-15 DIAGNOSIS — I519 Heart disease, unspecified: Secondary | ICD-10-CM | POA: Diagnosis present

## 2023-04-15 DIAGNOSIS — Z881 Allergy status to other antibiotic agents status: Secondary | ICD-10-CM

## 2023-04-15 DIAGNOSIS — I82402 Acute embolism and thrombosis of unspecified deep veins of left lower extremity: Secondary | ICD-10-CM | POA: Diagnosis not present

## 2023-04-15 DIAGNOSIS — I739 Peripheral vascular disease, unspecified: Secondary | ICD-10-CM

## 2023-04-15 DIAGNOSIS — Z794 Long term (current) use of insulin: Secondary | ICD-10-CM

## 2023-04-15 DIAGNOSIS — I480 Paroxysmal atrial fibrillation: Secondary | ICD-10-CM | POA: Diagnosis present

## 2023-04-15 DIAGNOSIS — Z7984 Long term (current) use of oral hypoglycemic drugs: Secondary | ICD-10-CM

## 2023-04-15 DIAGNOSIS — Z95810 Presence of automatic (implantable) cardiac defibrillator: Secondary | ICD-10-CM

## 2023-04-15 DIAGNOSIS — I5042 Chronic combined systolic (congestive) and diastolic (congestive) heart failure: Secondary | ICD-10-CM | POA: Diagnosis present

## 2023-04-15 DIAGNOSIS — Z833 Family history of diabetes mellitus: Secondary | ICD-10-CM

## 2023-04-15 DIAGNOSIS — Z9981 Dependence on supplemental oxygen: Secondary | ICD-10-CM

## 2023-04-15 DIAGNOSIS — Z6841 Body Mass Index (BMI) 40.0 and over, adult: Secondary | ICD-10-CM

## 2023-04-15 DIAGNOSIS — Z888 Allergy status to other drugs, medicaments and biological substances status: Secondary | ICD-10-CM

## 2023-04-15 DIAGNOSIS — I110 Hypertensive heart disease with heart failure: Secondary | ICD-10-CM | POA: Diagnosis present

## 2023-04-15 DIAGNOSIS — Z823 Family history of stroke: Secondary | ICD-10-CM

## 2023-04-15 DIAGNOSIS — Z7901 Long term (current) use of anticoagulants: Secondary | ICD-10-CM

## 2023-04-15 DIAGNOSIS — E1165 Type 2 diabetes mellitus with hyperglycemia: Secondary | ICD-10-CM

## 2023-04-15 DIAGNOSIS — E1142 Type 2 diabetes mellitus with diabetic polyneuropathy: Secondary | ICD-10-CM | POA: Diagnosis present

## 2023-04-15 DIAGNOSIS — I82409 Acute embolism and thrombosis of unspecified deep veins of unspecified lower extremity: Secondary | ICD-10-CM | POA: Diagnosis present

## 2023-04-15 DIAGNOSIS — Z86718 Personal history of other venous thrombosis and embolism: Secondary | ICD-10-CM

## 2023-04-15 DIAGNOSIS — N179 Acute kidney failure, unspecified: Secondary | ICD-10-CM | POA: Diagnosis present

## 2023-04-15 DIAGNOSIS — Z5941 Food insecurity: Secondary | ICD-10-CM

## 2023-04-15 DIAGNOSIS — E1151 Type 2 diabetes mellitus with diabetic peripheral angiopathy without gangrene: Secondary | ICD-10-CM | POA: Diagnosis present

## 2023-04-15 DIAGNOSIS — E785 Hyperlipidemia, unspecified: Secondary | ICD-10-CM | POA: Diagnosis present

## 2023-04-15 DIAGNOSIS — E876 Hypokalemia: Secondary | ICD-10-CM | POA: Diagnosis present

## 2023-04-15 DIAGNOSIS — I255 Ischemic cardiomyopathy: Secondary | ICD-10-CM | POA: Diagnosis present

## 2023-04-15 LAB — BASIC METABOLIC PANEL WITH GFR
Anion gap: 9 (ref 5–15)
BUN: 19 mg/dL (ref 8–23)
CO2: 18 mmol/L — ABNORMAL LOW (ref 22–32)
Calcium: 7.2 mg/dL — ABNORMAL LOW (ref 8.9–10.3)
Chloride: 117 mmol/L — ABNORMAL HIGH (ref 98–111)
Creatinine, Ser: 0.79 mg/dL (ref 0.44–1.00)
GFR, Estimated: >60 mL/min (ref >60)
Glucose, Bld: 124 mg/dL — ABNORMAL HIGH (ref 70–99)
Potassium: 3.1 mmol/L — ABNORMAL LOW (ref 3.5–5.1)
Sodium: 144 mmol/L (ref 135–145)

## 2023-04-15 LAB — CBC
HCT: 46.2 % — ABNORMAL HIGH (ref 36.0–46.0)
Hemoglobin: 14.6 g/dL (ref 12.0–15.0)
MCH: 29.6 pg (ref 26.0–34.0)
MCHC: 31.6 g/dL (ref 30.0–36.0)
MCV: 93.7 fL (ref 80.0–100.0)
Platelets: 119 10*3/uL — ABNORMAL LOW (ref 150–400)
RBC: 4.93 MIL/uL (ref 3.87–5.11)
RDW: 15 % (ref 11.5–15.5)
WBC: 5.3 10*3/uL (ref 4.0–10.5)
nRBC: 0 % (ref 0.0–0.2)

## 2023-04-15 MED ORDER — INSULIN DEGLUDEC 100 UNIT/ML ~~LOC~~ SOPN: 30.0000 [IU] | PEN_INJECTOR | Freq: Every day | SUBCUTANEOUS | Status: DC

## 2023-04-15 MED ORDER — EZETIMIBE 10 MG PO TABS
10.0000 mg | ORAL_TABLET | Freq: Every day | ORAL | Status: DC
  Administered 2023-04-16 – 2023-04-23 (×8): 10 mg via ORAL
  Filled 2023-04-15 (×8): qty 1

## 2023-04-15 MED ORDER — NITROGLYCERIN 0.4 MG SL SUBL: 0.4000 mg | SUBLINGUAL_TABLET | SUBLINGUAL | Status: DC | PRN

## 2023-04-15 MED ORDER — CALCITONIN (SALMON) 200 UNIT/ACT NA SOLN
1.0000 | Freq: Every day | NASAL | Status: DC
  Administered 2023-04-16 – 2023-04-23 (×8): 1 via NASAL
  Filled 2023-04-15: qty 3.7

## 2023-04-15 MED ORDER — ROSUVASTATIN CALCIUM 20 MG PO TABS
20.0000 mg | ORAL_TABLET | Freq: Every day | ORAL | Status: DC
  Administered 2023-04-16 – 2023-04-22 (×6): 20 mg via ORAL
  Filled 2023-04-15 (×6): qty 1

## 2023-04-15 MED ORDER — DAPAGLIFLOZIN PROPANEDIOL 10 MG PO TABS
10.0000 mg | ORAL_TABLET | Freq: Every day | ORAL | Status: DC
  Administered 2023-04-16 – 2023-04-23 (×8): 10 mg via ORAL
  Filled 2023-04-15 (×8): qty 1

## 2023-04-15 MED ORDER — FERROUS SULFATE 325 (65 FE) MG PO TABS
325.0000 mg | ORAL_TABLET | Freq: Every day | ORAL | Status: DC
  Administered 2023-04-16 – 2023-04-23 (×8): 325 mg via ORAL
  Filled 2023-04-15 (×8): qty 1

## 2023-04-15 MED ORDER — GABAPENTIN 300 MG PO CAPS
300.0000 mg | ORAL_CAPSULE | Freq: Three times a day (TID) | ORAL | Status: DC
  Administered 2023-04-16 – 2023-04-23 (×21): 300 mg via ORAL
  Filled 2023-04-15 (×21): qty 1

## 2023-04-15 MED ORDER — SACUBITRIL-VALSARTAN 24-26 MG PO TABS
1.0000 | ORAL_TABLET | Freq: Two times a day (BID) | ORAL | Status: DC
  Administered 2023-04-16 – 2023-04-17 (×4): 1 via ORAL
  Filled 2023-04-15 (×4): qty 1

## 2023-04-15 MED ORDER — HYDROMORPHONE HCL 1 MG/ML IJ SOLN
0.5000 mg | Freq: Once | INTRAMUSCULAR | Status: AC
  Administered 2023-04-15: 0.5 mg via INTRAVENOUS
  Filled 2023-04-15: qty 1

## 2023-04-15 MED ORDER — OXYCODONE HCL 5 MG PO TABS
5.0000 mg | ORAL_TABLET | Freq: Four times a day (QID) | ORAL | Status: DC | PRN
  Administered 2023-04-16 – 2023-04-20 (×13): 5 mg via ORAL
  Filled 2023-04-15 (×13): qty 1

## 2023-04-15 MED ORDER — LATANOPROST 0.005 % OP SOLN
1.0000 [drp] | Freq: Every day | OPHTHALMIC | Status: DC
  Administered 2023-04-17 – 2023-04-22 (×6): 1 [drp] via OPHTHALMIC
  Filled 2023-04-15: qty 2.5

## 2023-04-15 MED ORDER — PROPOFOL 10 MG/ML IV BOLUS
0.5000 mg/kg | Freq: Once | INTRAVENOUS | Status: AC
  Administered 2023-04-15: 52.2 mg via INTRAVENOUS
  Filled 2023-04-15: qty 20

## 2023-04-15 MED ORDER — INSULIN ASPART 100 UNIT/ML IJ SOLN
0.0000 [IU] | Freq: Three times a day (TID) | INTRAMUSCULAR | Status: DC
  Administered 2023-04-16: 2 [IU] via SUBCUTANEOUS
  Administered 2023-04-16: 3 [IU] via SUBCUTANEOUS
  Administered 2023-04-17: 1 [IU] via SUBCUTANEOUS
  Administered 2023-04-18 (×3): 2 [IU] via SUBCUTANEOUS
  Administered 2023-04-19: 3 [IU] via SUBCUTANEOUS
  Administered 2023-04-19: 2 [IU] via SUBCUTANEOUS
  Administered 2023-04-20: 3 [IU] via SUBCUTANEOUS
  Administered 2023-04-20: 1 [IU] via SUBCUTANEOUS
  Administered 2023-04-20: 2 [IU] via SUBCUTANEOUS
  Administered 2023-04-21: 5 [IU] via SUBCUTANEOUS
  Administered 2023-04-21 – 2023-04-22 (×3): 2 [IU] via SUBCUTANEOUS
  Administered 2023-04-22: 1 [IU] via SUBCUTANEOUS
  Administered 2023-04-22 – 2023-04-23 (×2): 2 [IU] via SUBCUTANEOUS
  Administered 2023-04-23: 3 [IU] via SUBCUTANEOUS
  Filled 2023-04-15: qty 0.09

## 2023-04-15 MED ORDER — AMIODARONE HCL 100 MG PO TABS
100.0000 mg | ORAL_TABLET | Freq: Every day | ORAL | Status: DC
  Administered 2023-04-16 – 2023-04-23 (×8): 100 mg via ORAL
  Filled 2023-04-15 (×8): qty 1

## 2023-04-15 MED ORDER — INSULIN GLARGINE-YFGN 100 UNIT/ML ~~LOC~~ SOLN
30.0000 [IU] | Freq: Every day | SUBCUTANEOUS | Status: DC
  Administered 2023-04-16 – 2023-04-23 (×8): 30 [IU] via SUBCUTANEOUS
  Filled 2023-04-15 (×8): qty 0.3

## 2023-04-15 MED ORDER — OXYCODONE HCL 5 MG PO TABS
5.0000 mg | ORAL_TABLET | Freq: Once | ORAL | Status: AC
  Administered 2023-04-15: 5 mg via ORAL
  Filled 2023-04-15: qty 1

## 2023-04-15 NOTE — ED Notes (Signed)
Called ortho tech for splint 

## 2023-04-15 NOTE — ED Notes (Signed)
 Patient is complaining of pain in leg, Messaged provider for medication.

## 2023-04-15 NOTE — ED Triage Notes (Signed)
 EMS reports from MD office, called for witnessed slip and fall, no LOC, no head strike, does take blood thinners. Obvious deformity right ankle noted. PMS intact.  BP 152/100 HR 66 RR 20 Sp02 90 RA (96 @ 2 ltrs 24-7) CBG 166  22ga R hand Fentanyl enroute.

## 2023-04-15 NOTE — ED Provider Notes (Signed)
 [place] EMERGENCY DEPARTMENT AT [facility] Provider Note   CSN: [id number] Arrival date & time: 04/15/23  1727     History  Chief Complaint  Patient presents with   Fall   Ankle Pain    [name] is a 73 y.o. female.   Fall  Ankle Pain Patient presents with right ankle injury.  Was stepping into an SUV and missed a step and twisted her right ankle.  No other injury.  States she was called by family member.  However pain and deformity to right ankle.  Is on anticoagulation but did not hit her head.    Past Medical History:  Diagnosis Date   Abnormal LFTs    AICD (automatic cardioverter/defibrillator) present    Angina decubitus (HCC) 11/06/2012   Angina pectoris (HCC)    Anxiety    Arthritis    Back pain    Bundle branch block 05/2016   CAD (coronary artery disease) 06/18/2010   CHF (congestive heart failure) (HCC)    Chronic combined systolic and diastolic CHF, NYHA class 3 (HCC)    Chronic combined systolic and diastolic heart failure (HCC)    Chronic systolic dysfunction of left ventricle 09/24/2012   Coronary artery disease    s/p CABG 2007   Depression    Dermal mycosis    Diabetes mellitus    type II   DKA (diabetic ketoacidoses) 01/12/2019   DVT (deep venous thrombosis) (HCC) 09/2015   bilateral   Edema, lower extremity    Glaucoma    Hyperlipidemia    Hypertension    Hypoglycemia 09/09/2012   Insomnia    Insulin dependent diabetes mellitus (HCC) 04/23/2012   Ischemic cardiomyopathy    EF 30-35%, s/p ICD 4/08 by Dr Amil Amen   Joint pain    LBBB (left bundle branch block) 09/24/2012   Morbid obesity (HCC) 11/06/2012   Obesity    Oxygen dependent 2 L   Pain in left knee    Peripheral neuropathy    Peripheral vascular disease (HCC)    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   LIMA to D2, SVG to ramus intermediate, sequential SVG to OM1-OM2, SVG to RCA with EVH via both legs    Sleep apnea    uses O2 at night   Tinea    Type 2 diabetes  mellitus with hyperglycemia, with long-term current use of insulin (HCC) 04/20/2019   Uncontrolled diabetes mellitus 02/2019   Ventricular tachycardia (HCC) 01/16/2015   sustained VT terminated with ATP, CL 340 msec   Vitamin B 12 deficiency 11/05/2018   Vitamin D deficiency 11/05/2018   Past Surgical History:  Procedure Laterality Date   BI-VENTRICULAR IMPLANTABLE CARDIOVERTER DEFIBRILLATOR UPGRADE N/A 09/25/2012   Procedure: BI-VENTRICULAR IMPLANTABLE CARDIOVERTER DEFIBRILLATOR UPGRADE;  Surgeon: Gardiner Rhyme, MD;  Location: [facility] CATH LAB;  Service: Cardiovascular;  Laterality: N/A;   BIV ICD GENERATOR CHANGEOUT N/A 12/11/2022   Procedure: BIV ICD GENERATOR CHANGEOUT;  Surgeon: Lanier Prude, MD;  Location: [facility] INVASIVE CV LAB;  Service: Cardiovascular;  Laterality: N/A;   BIV ICD GENERATOR CHANGEOUT N/A 12/28/2022   Procedure: BIV ICD GENERATOR CHANGEOUT;  Surgeon: Lanier Prude, MD;  Location: [facility] INVASIVE CV LAB;  Service: Cardiovascular;  Laterality: N/A;   BIV UPGRADE Left 12/28/2022   Procedure: BIV UPGRADE;  Surgeon: Lanier Prude, MD;  Location: [facility] INVASIVE CV LAB;  Service: Cardiovascular;  Laterality: Left;   BREAST BIOPSY Right 04/05/2014   CARDIAC CATHETERIZATION     CARDIAC  DEFIBRILLATOR PLACEMENT  4/08   by Dr Amil Amen (MDT)   COLONOSCOPY WITH PROPOFOL N/A 10/12/2016   Procedure: COLONOSCOPY WITH PROPOFOL;  Surgeon: Sherrilyn Rist, MD;  Location: [facility] ENDOSCOPY;  Service: Gastroenterology;  Laterality: N/A;   CORONARY ARTERY BYPASS GRAFT  10/05/2005   by Dr Laneta Simmers   IMPLANTABLE CARDIOVERTER DEFIBRILLATOR IMPLANT  09/25/12   attempt of upgrade to CRT-D unsuccessful due to CS anatomy, SJM Unify Asaura device placed with LV port capped by Dr Johney Frame   IR GENERIC HISTORICAL  10/03/2015   IR US GUIDE VASC ACCESS LEFT 10/03/2015 Malachy Moan, MD [facility]   IR GENERIC HISTORICAL  10/03/2015   IR VENO/EXT/UNI LEFT 10/03/2015 Malachy Moan, MD [facility]   IR  GENERIC HISTORICAL  10/03/2015   IR VENOCAVAGRAM IVC 10/03/2015 Malachy Moan, MD [facility]   IR GENERIC HISTORICAL  10/03/2015   IR INFUSION THROMBOL VENOUS INITIAL (MS) 10/03/2015 Malachy Moan, MD [facility]   IR GENERIC HISTORICAL  10/03/2015   IR US GUIDE VASC ACCESS LEFT 10/03/2015 Malachy Moan, MD [facility]   IR GENERIC HISTORICAL  10/04/2015   IR THROMBECT VENO MECH MOD SED 10/04/2015 Simonne Come, MD [facility]   IR GENERIC HISTORICAL  10/04/2015   IR TRANSCATH PLC STENT 1ST ART NOT LE CV CAR VERT CAR 10/04/2015 Simonne Come, MD [facility]   IR GENERIC HISTORICAL  10/04/2015   IR THROMB F/U EVAL ART/VEN FINAL DAY (MS) 10/04/2015 Simonne Come, MD [facility]   IR GENERIC HISTORICAL  11/02/2015   IR RADIOLOGIST EVAL & MGMT 11/02/2015 Simonne Come, MD [facility]   IR RADIOLOGIST EVAL & MGMT  04/26/2016   LEFT HEART CATH AND CORS/GRAFTS ANGIOGRAPHY N/A 01/19/2019   Procedure: LEFT HEART CATH AND CORS/GRAFTS ANGIOGRAPHY;  Surgeon: Lyn Records, MD;  Location: [facility] INVASIVE CV LAB;  Service: Cardiovascular;  Laterality: N/A;   LEFT HEART CATH AND CORS/GRAFTS ANGIOGRAPHY N/A 01/15/2022   Procedure: LEFT HEART CATH AND CORS/GRAFTS ANGIOGRAPHY;  Surgeon: Runell Gess, MD;  Location: [facility] INVASIVE CV LAB;  Service: Cardiovascular;  Laterality: N/A;     Home Medications Prior to Admission medications   Medication Sig Start Date End Date Taking? Authorizing Provider  albuterol (VENTOLIN HFA) 108 (90 Base) MCG/ACT inhaler Inhale 2 puffs into the lungs 2 (two) times daily as needed for wheezing or shortness of breath.    [provider]  Alcohol Swabs (DROPSAFE ALCOHOL PREP) 70 % PADS Apply topically. 06/06/22   [provider]  amiodarone (PACERONE) 200 MG tablet Take 0.5 tablets (100 mg total) by mouth daily. 07/25/22   Lanier Prude, MD  apixaban (ELIQUIS) 5 MG TABS tablet TAKE 1 TABLET TWICE DAILY 11/14/22   [name], MD  calcitonin,  salmon, (MIACALCIN/FORTICAL) 200 UNIT/ACT nasal spray Place 1 spray into alternate nostrils daily. 10/12/22   [provider]  Continuous Glucose Sensor (DEXCOM G7 SENSOR) MISC 1 Device by Does not apply route as directed. 01/31/23   Shamleffer, Konrad Dolores, MD  cyanocobalamin (,VITAMIN B-12,) 1000 MCG/ML injection Inject 1,000 mcg into the muscle every 30 (thirty) days.    [provider]  dapagliflozin propanediol (FARXIGA) 10 MG TABS tablet TAKE 1 TABLET EVERY DAY 02/18/23   Shamleffer, Konrad Dolores, MD  ENTRESTO 24-26 MG TAKE 1 TABLET TWICE DAILY 11/26/22   Jake Bathe, MD  ezetimibe (ZETIA) 10 MG tablet Take 1 tablet (10 mg total) by mouth daily. 01/15/23   Jake Bathe, MD  ferrous sulfate 325 (65 FE) MG EC tablet Take 325 mg by mouth daily with breakfast.    [provider]  gabapentin (NEURONTIN) 300 MG capsule Take 300 mg by mouth 3 (three) times daily. 01/15/23   [provider]  glucose blood (ACCU-CHEK GUIDE) test strip Check 3 times daily 02/22/22   Shamleffer, Konrad Dolores, MD  Insulin Pen Needle 31G X 5 MM MISC 1 Device by Does not apply route in the morning, at noon, in the evening, and at bedtime. 11/03/21   Shamleffer, Konrad Dolores, MD  ketoconazole (NIZORAL) 2 % cream SMARTSIG:1 Topical Daily 03/25/23   [provider]  latanoprost (XALATAN) 0.005 % ophthalmic solution Place 1 drop into both eyes at bedtime.    [provider]  nitroGLYCERIN (NITROSTAT) 0.4 MG SL tablet PLACE 1 TABLET (0.4 MG TOTAL) UNDER THE TONGUE EVERY 5 (FIVE) MINUTES AS NEEDED FOR CHEST PAIN. 11/26/22   Jake Bathe, MD  NOVOLOG FLEXPEN 100 UNIT/ML FlexPen INJECT PER SLIDING SCALE AS DIRECTED , MAX 30 UNITS PER DAY. DISCARD PEN 28 DAYS AFTER OPENING Patient taking differently: Inject 0-4 Units into the skin 3 (three) times daily with meals. INJECT PER SLIDING SCALE AS DIRECTED ,  MAX 30 UNITS PER DAY. DISCARD PEN 28 DAYS AFTER OPENING 12/04/22    Shamleffer, Konrad Dolores, MD  OXYGEN Inhale 2 L into the lungs continuous.    [provider]  rosuvastatin (CRESTOR) 20 MG tablet Take 1 tablet (20 mg total) by mouth daily at 6 PM. 10/07/15   Alm Bustard, MD  TRESIBA FLEXTOUCH 100 UNIT/ML FlexTouch Pen INJECT 30 UNITS INTO THE SKIN DAILY. 11/29/22   Shamleffer, Konrad Dolores, MD  TRUE METRIX BLOOD GLUCOSE TEST test strip TEST THREE TIMES DAILY 12/11/21   Shamleffer, Konrad Dolores, MD  Vitamin D, Ergocalciferol, (DRISDOL) 1.25 MG (50000 UT) CAPS capsule Take 1 capsule (50,000 Units total) by mouth every 7 (seven) days. 12/30/18   Corinna Capra A, DO      Allergies    Metformin and related, Ozempic (0.25 or 0.5 mg-dose) [semaglutide(0.25 or 0.5mg -dos)], Atorvastatin calcium, and Levofloxacin    Review of Systems   Review of Systems  Physical Exam Updated Vital Signs BP (!) 142/73   Pulse (!) 59   Temp 98.2 F (36.8 C)   Resp 17   SpO2 95%  Physical Exam Vitals and nursing note reviewed.  Cardiovascular:     Rate and Rhythm: Regular rhythm.  Pulmonary:     Breath sounds: No wheezing.  Abdominal:     Tenderness: There is no abdominal tenderness.  Musculoskeletal:        General: Tenderness present.     Comments: Deformity to right ankle.  Rotated somewhat externally.  Skin intact.  Sensation intact with  Skin:    Capillary Refill: Capillary refill takes less than 2 seconds.  Neurological:     Mental Status: She is alert and oriented to person, place, and time.     ED Results / Procedures / Treatments   Labs (all labs ordered are listed, but only abnormal results are displayed) Labs Reviewed  BASIC METABOLIC PANEL WITH GFR - Abnormal; Notable for the following components:      Result Value   Potassium 3.1 (*)    Chloride 117 (*)    CO2 18 (*)    Glucose, Bld 124 (*)    Calcium 7.2 (*)    All other components within normal limits  CBC - Abnormal; Notable for the following  components:   HCT 46.2  (*)    Platelets 119 (*)    All other components within normal limits    EKG None  Radiology DG Ankle Right Port Result Date: 04/15/2023 CLINICAL DATA:  Fall today with right lower leg deformity.  Pain. EXAM: PORTABLE RIGHT ANKLE - 2 VIEW; PORTABLE RIGHT TIBIA AND FIBULA - 2 VIEW COMPARISON:  None Available. FINDINGS: Ankle: Displaced oblique fracture of the distal fibula just proximal to the ankle mortise. Oblique displaced fracture through the medial malleolus. Lateral translation of the talus and distal fracture fragments with respect to the tibial plafond. Suspected posterior tibial tubercle fracture. Generalized soft tissue edema. Vascular calcifications. Remote healed second metatarsal fracture. Tibia/fibula: Proximal tibia and fibular intact. No fracture of the proximal lower leg. Distal soft tissue edema. Vascular calcifications are seen. IMPRESSION: 1. Displaced distal fibular and tibial fractures with lateral translation of the talus and distal fracture fragments with respect to the tibial plafond. 2. Suspected posterior tibial tubercle fracture. 3. No fracture of the proximal lower leg. Electronically Signed   By: Narda Rutherford M.D.   On: 04/15/2023 19:05   DG Tibia/Fibula Right Port Result Date: 04/15/2023 CLINICAL DATA:  Fall today with right lower leg deformity.  Pain. EXAM: PORTABLE RIGHT ANKLE - 2 VIEW; PORTABLE RIGHT TIBIA AND FIBULA - 2 VIEW COMPARISON:  None Available. FINDINGS: Ankle: Displaced oblique fracture of the distal fibula just proximal to the ankle mortise. Oblique displaced fracture through the medial malleolus. Lateral translation of the talus and distal fracture fragments with respect to the tibial plafond. Suspected posterior tibial tubercle fracture. Generalized soft tissue edema. Vascular calcifications. Remote healed second metatarsal fracture. Tibia/fibula: Proximal tibia and fibular intact. No fracture of the proximal lower leg. Distal soft tissue edema. Vascular  calcifications are seen. IMPRESSION: 1. Displaced distal fibular and tibial fractures with lateral translation of the talus and distal fracture fragments with respect to the tibial plafond. 2. Suspected posterior tibial tubercle fracture. 3. No fracture of the proximal lower leg. Electronically Signed   By: Narda Rutherford M.D.   On: 04/15/2023 19:05   VAS [name] ABI WITH/WO TBI Result Date: 04/15/2023 LOWER EXTREMITY ARTERIAL DUPLEX STUDY Patient Name:  Khrystal Jeanmarie  Date of Exam:   04/15/2023 Medical Rec #: [id number]         Accession #:    [id number] Date of Birth: [birth year]         Patient Gender: F Patient Age:   5 years Exam Location:  [facility] Procedure:      VAS [name] LOWER EXTREMITY ARTERIAL DUPLEX Referring Phys: EMMA COLLINS --------------------------------------------------------------------------------  Indications: Rest pain, and peripheral artery disease.  Current ABI: Right: 0.66              Left: [place] Limitations: Body habitus, Imaging hardware/software limitations Performing Technologist: [name] RVT  Examination Guidelines: A complete evaluation includes B-mode imaging, spectral Doppler, color Doppler, and power Doppler as needed of all accessible portions of each vessel. Bilateral testing is considered an integral part of a complete examination. Limited examinations for reoccurring indications may be performed as noted.  +-----------+--------+-----+---------------+----------+--------+ RIGHT      PSV cm/sRatioStenosis       Waveform  Comments +-----------+--------+-----+---------------+----------+--------+ CFA Distal 165          30-49% stenosisbiphasic           +-----------+--------+-----+---------------+----------+--------+ DFA        67  biphasic           +-----------+--------+-----+---------------+----------+--------+ SFA Prox   168          30-49% stenosisbiphasic            +-----------+--------+-----+---------------+----------+--------+ SFA Mid    45                          biphasic           +-----------+--------+-----+---------------+----------+--------+ SFA Distal 93                          biphasic           +-----------+--------+-----+---------------+----------+--------+ POP Prox   90                          biphasic           +-----------+--------+-----+---------------+----------+--------+ POP Distal 218          50-74% stenosisbiphasic           +-----------+--------+-----+---------------+----------+--------+ TP Trunk   47                          biphasic           +-----------+--------+-----+---------------+----------+--------+ ATA Distal 84                          monophasic         +-----------+--------+-----+---------------+----------+--------+ PTA Distal 54                          monophasic         +-----------+--------+-----+---------------+----------+--------+ PERO Distal0            occluded       absent             +-----------+--------+-----+---------------+----------+--------+  +-----------+--------+-----+--------+----------+--------------+ LEFT       PSV cm/sRatioStenosisWaveform  Comments       +-----------+--------+-----+--------+----------+--------------+ CFA Distal 103                  biphasic                 +-----------+--------+-----+--------+----------+--------------+ DFA        125                  biphasic                 +-----------+--------+-----+--------+----------+--------------+ SFA Prox   131                  biphasic                 +-----------+--------+-----+--------+----------+--------------+ SFA Mid    106                  biphasic                 +-----------+--------+-----+--------+----------+--------------+ SFA Distal 89                   biphasic                 +-----------+--------+-----+--------+----------+--------------+ POP Prox    74                   biphasic                 +-----------+--------+-----+--------+----------+--------------+  POP Distal 67                   biphasic                 +-----------+--------+-----+--------+----------+--------------+ TP Trunk                                  not visualized +-----------+--------+-----+--------+----------+--------------+ ATA Distal 0            occludedabsent                   +-----------+--------+-----+--------+----------+--------------+ PTA Distal 53                   monophasic               +-----------+--------+-----+--------+----------+--------------+ PERO Distal0            occludedabsent                   +-----------+--------+-----+--------+----------+--------------+  Summary: Right: - Diffuse atherosclerotic changes noted throughout the femoral-popliteal arteries with areas of 30-49% and 50-74% stenoses. - No flow was observed at the anterior tibial artery distally. Left: - Diffuse atherosclerotic changes noted throughout the femoral-popliteal arteries with no evidence of significant stenosis. - No flow was observed at the anterior tibial or peroneal arteries distally.  See table(s) above for measurements and observations.    Preliminary     Procedures .Ortho Injury Treatment  Date/Time: 04/15/2023 9:00 PM  Performed by: Benjiman Core, MD Authorized by: Benjiman Core, MD   Consent:    Consent obtained:  Verbal   Consent given by:  Patient   Risks discussed:  Fracture, nerve damage, vascular damage, restricted joint movement, irreducible dislocation and recurrent dislocation   Alternatives discussed:  No treatmentInjury location: ankle Location details: right ankle Injury type: fracture-dislocation Pre-procedure distal perfusion: diminished Pre-procedure neurological function: normal Pre-procedure range of motion: reduced  Anesthesia: Local anesthesia used: no  Patient sedated: Yes. Refer to sedation  procedure documentation for details of sedation. Manipulation performed: yes Reduction successful: yes X-ray confirmed reduction: yes Immobilization: splint Splint type: short leg Splint Applied by: Ortho Tech Supplies used: Ortho-Glass Post-procedure distal perfusion: diminished Post-procedure neurological function: diminished Post-procedure range of motion: improved   .Sedation  Date/Time: 04/15/2023 9:00 PM  Performed by: Benjiman Core, MD Authorized by: Benjiman Core, MD   Consent:    Consent obtained:  Verbal   Consent given by:  Patient   Risks discussed:  Allergic reaction, dysrhythmia, inadequate sedation, nausea, prolonged hypoxia resulting in organ damage, prolonged sedation necessitating reversal and respiratory compromise necessitating ventilatory assistance and intubation   Alternatives discussed:  Analgesia without sedation Universal protocol:    Immediately prior to procedure, a time out was called: yes     Patient identity confirmed:  Arm band and verbally with patient Indications:    Procedure performed:  Fracture reduction   Procedure necessitating sedation performed by:  Physician performing sedation Pre-sedation assessment:    Time since last food or drink:  9   ASA classification: class 3 - patient with severe systemic disease     Mallampati score:  III - soft palate, base of uvula visible   Neck mobility: normal     Pre-sedation assessments completed and reviewed: airway patency, cardiovascular function, hydration status, mental status, nausea/vomiting and respiratory function   A pre-sedation assessment was completed prior to the start of the procedure Immediate  pre-procedure details:    Reassessment: Patient reassessed immediately prior to procedure     Reviewed: vital signs, relevant labs/tests and NPO status     Verified: bag valve mask available, emergency equipment available, intubation equipment available, IV patency confirmed, oxygen  available and suction available   Procedure details (see MAR for exact dosages):    Preoxygenation:  Nasal cannula   Sedation:  Propofol   Intended level of sedation: deep   Analgesia:  Fentanyl   Intra-procedure monitoring:  Blood pressure monitoring, cardiac monitor, continuous capnometry, continuous pulse oximetry, frequent vital sign checks and frequent LOC assessments   Intra-procedure events: none     Total Provider sedation time (minutes):  10 Post-procedure details:   A post-sedation assessment was completed following the completion of the procedure.   Attendance: Constant attendance by certified staff until patient recovered     Recovery: Patient returned to pre-procedure baseline     Post-sedation assessments completed and reviewed: airway patency, cardiovascular function, hydration status, mental status, nausea/vomiting, pain level, respiratory function and temperature     Patient is stable for discharge or admission: yes     Procedure completion:  Tolerated well, no immediate complications     Medications Ordered in ED Medications  HYDROmorphone (DILAUDID) injection 0.5 mg (0.5 mg Intravenous Given 04/15/23 1812)  propofol (DIPRIVAN) 10 mg/mL bolus/IV push 52.2 mg (52.2 mg Intravenous Given 04/15/23 2014)  HYDROmorphone (DILAUDID) injection 0.5 mg (0.5 mg Intravenous Given 04/15/23 2122)    ED Course/ Medical Decision Making/ A&P                                 Medical Decision Making Amount and/or Complexity of Data Reviewed Labs: ordered. Radiology: ordered.  Risk Prescription drug management.    patient with fall.  Right ankle injury with obvious deformity.  X-ray independently interpreted does show trimalleolar ankle fracture.  Will reduce.  Reduction done with improvement of alignment on x-ray, however continued pain.  Uncontrolled IV medicine.  Will admit for pain control.  Discussed with Dr. Charlann Boxer from orthopedic surgery.  Feels that [facility]  admission with would likely be better since the trauma doctors are over there.  Will discuss with hospitalist.        Final Clinical Impression(s) / ED Diagnoses Final diagnoses:  Closed trimalleolar fracture of right ankle, initial encounter    Rx / DC Orders ED Discharge Orders     None         Benjiman Core, MD 04/15/23 2128

## 2023-04-15 NOTE — H&P (Signed)
 History and Physical    [name] JXB:[id number] DOB: Oct 15, 1950 DOA: 04/15/2023  Patient coming from: Home.  Chief Complaint: Fall.  HPI: [name] is a 73 y.o. female with history of chronic combined systolic and diastolic CHF, CAD status post CABG, diabetes mellitus type 2, peripheral artery disease, prior history of DVT was referred to the ER after patient had a fall.  Patient states she misstepped while entering a van.  Denies hitting her head or losing consciousness.  Since the fall she has been hurting in the right ankle.  ED Course: In the ER x-rays revealed right ankle fracture and was placed on a splint.  ER physician discussed with Dr. Charlann Boxer on-call orthopedic surgeon who advised transfer patient to [facility] for further observation and management.  Labs revealed potassium of 3.1.  Potassium replacement was given.  Review of Systems: As per HPI, rest all negative.   Past Medical History:  Diagnosis Date   Abnormal LFTs    AICD (automatic cardioverter/defibrillator) present    Angina decubitus (HCC) 11/06/2012   Angina pectoris (HCC)    Anxiety    Arthritis    Back pain    Bundle branch block 05/2016   CAD (coronary artery disease) 06/18/2010   CHF (congestive heart failure) (HCC)    Chronic combined systolic and diastolic CHF, NYHA class 3 (HCC)    Chronic combined systolic and diastolic heart failure (HCC)    Chronic systolic dysfunction of left ventricle 09/24/2012   Coronary artery disease    s/p CABG 2007   Depression    Dermal mycosis    Diabetes mellitus    type II   DKA (diabetic ketoacidoses) 01/12/2019   DVT (deep venous thrombosis) (HCC) 09/2015   bilateral   Edema, lower extremity    Glaucoma    Hyperlipidemia    Hypertension    Hypoglycemia 09/09/2012   Insomnia    Insulin dependent diabetes mellitus (HCC) 04/23/2012   Ischemic cardiomyopathy    EF 30-35%, s/p ICD 4/08 by Dr Amil Amen   Joint pain    LBBB (left bundle branch block)  09/24/2012   Morbid obesity (HCC) 11/06/2012   Obesity    Oxygen dependent 2 L   Pain in left knee    Peripheral neuropathy    Peripheral vascular disease (HCC)    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   LIMA to D2, SVG to ramus intermediate, sequential SVG to OM1-OM2, SVG to RCA with EVH via both legs    Sleep apnea    uses O2 at night   Tinea    Type 2 diabetes mellitus with hyperglycemia, with long-term current use of insulin (HCC) 04/20/2019   Uncontrolled diabetes mellitus 02/2019   Ventricular tachycardia (HCC) 01/16/2015   sustained VT terminated with ATP, CL 340 msec   Vitamin B 12 deficiency 11/05/2018   Vitamin D deficiency 11/05/2018    Past Surgical History:  Procedure Laterality Date   BI-VENTRICULAR IMPLANTABLE CARDIOVERTER DEFIBRILLATOR UPGRADE N/A 09/25/2012   Procedure: BI-VENTRICULAR IMPLANTABLE CARDIOVERTER DEFIBRILLATOR UPGRADE;  Surgeon: Gardiner Rhyme, MD;  Location: [facility] CATH LAB;  Service: Cardiovascular;  Laterality: N/A;   BIV ICD GENERATOR CHANGEOUT N/A 12/11/2022   Procedure: BIV ICD GENERATOR CHANGEOUT;  Surgeon: Lanier Prude, MD;  Location: [facility] INVASIVE CV LAB;  Service: Cardiovascular;  Laterality: N/A;   BIV ICD GENERATOR CHANGEOUT N/A 12/28/2022   Procedure: BIV ICD GENERATOR CHANGEOUT;  Surgeon: Lanier Prude, MD;  Location: [facility] INVASIVE CV LAB;  Service:  Cardiovascular;  Laterality: N/A;   BIV UPGRADE Left 12/28/2022   Procedure: BIV UPGRADE;  Surgeon: Lanier Prude, MD;  Location: [facility] INVASIVE CV LAB;  Service: Cardiovascular;  Laterality: Left;   BREAST BIOPSY Right 04/05/2014   CARDIAC CATHETERIZATION     CARDIAC DEFIBRILLATOR PLACEMENT  4/08   by Dr Amil Amen (MDT)   COLONOSCOPY WITH PROPOFOL N/A 10/12/2016   Procedure: COLONOSCOPY WITH PROPOFOL;  Surgeon: Sherrilyn Rist, MD;  Location: [facility] ENDOSCOPY;  Service: Gastroenterology;  Laterality: N/A;   CORONARY ARTERY BYPASS GRAFT  10/05/2005   by Dr Laneta Simmers   IMPLANTABLE CARDIOVERTER DEFIBRILLATOR  IMPLANT  09/25/12   attempt of upgrade to CRT-D unsuccessful due to CS anatomy, SJM Unify Asaura device placed with LV port capped by Dr Johney Frame   IR GENERIC HISTORICAL  10/03/2015   IR US GUIDE VASC ACCESS LEFT 10/03/2015 Malachy Moan, MD [facility]   IR GENERIC HISTORICAL  10/03/2015   IR VENO/EXT/UNI LEFT 10/03/2015 Malachy Moan, MD [facility]   IR GENERIC HISTORICAL  10/03/2015   IR VENOCAVAGRAM IVC 10/03/2015 Malachy Moan, MD [facility]   IR GENERIC HISTORICAL  10/03/2015   IR INFUSION THROMBOL VENOUS INITIAL (MS) 10/03/2015 Malachy Moan, MD [facility]   IR GENERIC HISTORICAL  10/03/2015   IR US GUIDE VASC ACCESS LEFT 10/03/2015 Malachy Moan, MD [facility]   IR GENERIC HISTORICAL  10/04/2015   IR THROMBECT VENO MECH MOD SED 10/04/2015 Simonne Come, MD [facility]   IR GENERIC HISTORICAL  10/04/2015   IR TRANSCATH PLC STENT 1ST ART NOT LE CV CAR VERT CAR 10/04/2015 Simonne Come, MD [facility]   IR GENERIC HISTORICAL  10/04/2015   IR THROMB F/U EVAL ART/VEN FINAL DAY (MS) 10/04/2015 Simonne Come, MD [facility]   IR GENERIC HISTORICAL  11/02/2015   IR RADIOLOGIST EVAL & MGMT 11/02/2015 Simonne Come, MD [facility]   IR RADIOLOGIST EVAL & MGMT  04/26/2016   LEFT HEART CATH AND CORS/GRAFTS ANGIOGRAPHY N/A 01/19/2019   Procedure: LEFT HEART CATH AND CORS/GRAFTS ANGIOGRAPHY;  Surgeon: Lyn Records, MD;  Location: [facility] INVASIVE CV LAB;  Service: Cardiovascular;  Laterality: N/A;   LEFT HEART CATH AND CORS/GRAFTS ANGIOGRAPHY N/A 01/15/2022   Procedure: LEFT HEART CATH AND CORS/GRAFTS ANGIOGRAPHY;  Surgeon: Runell Gess, MD;  Location: [facility] INVASIVE CV LAB;  Service: Cardiovascular;  Laterality: N/A;     reports that she has never smoked. She has never used smokeless tobacco. She reports that she does not drink alcohol and does not use drugs.  Allergies  Allergen Reactions   Metformin And Related Itching, Swelling and Other (See Comments)    Leg pain & swelling in  legs   Ozempic (0.25 Or 0.5 Mg-Dose) [Semaglutide(0.25 Or 0.5mg -Dos)] Other (See Comments)    Pt with hx of pancreatitis    Atorvastatin Calcium Itching and Rash   Levofloxacin Itching    Family History  Problem Relation Age of Onset   Diabetes Mother 37       died - HTN   Stroke Mother    High blood pressure Mother    Sudden death Mother    Obesity Mother    Other Other        No early family hx of CAD    Prior to Admission medications   Medication Sig Start Date End Date Taking? Authorizing Provider  albuterol (VENTOLIN HFA) 108 (90 Base) MCG/ACT inhaler Inhale 2 puffs into the lungs 2 (two) times daily as needed  for wheezing or shortness of breath.   Yes [provider]  Alcohol Swabs (DROPSAFE ALCOHOL PREP) 70 % PADS Apply topically. 06/06/22  Yes [provider]  amiodarone (PACERONE) 200 MG tablet Take 0.5 tablets (100 mg total) by mouth daily. 07/25/22  Yes Lanier Prude, MD  apixaban (ELIQUIS) 5 MG TABS tablet TAKE 1 TABLET TWICE DAILY 11/14/22  Yes [name], MD  calcitonin, salmon, (MIACALCIN/FORTICAL) 200 UNIT/ACT nasal spray Place 1 spray into alternate nostrils daily. 10/12/22  Yes [provider]  Continuous Glucose Sensor (DEXCOM G7 SENSOR) MISC 1 Device by Does not apply route as directed. 01/31/23  Yes Shamleffer, Konrad Dolores, MD  cyanocobalamin (,VITAMIN B-12,) 1000 MCG/ML injection Inject 1,000 mcg into the muscle every 30 (thirty) days.   Yes [provider]  dapagliflozin propanediol (FARXIGA) 10 MG TABS tablet TAKE 1 TABLET EVERY DAY 02/18/23  Yes Shamleffer, Konrad Dolores, MD  ENTRESTO 24-26 MG TAKE 1 TABLET TWICE DAILY 11/26/22  Yes Jake Bathe, MD  ezetimibe (ZETIA) 10 MG tablet Take 1 tablet (10 mg total) by mouth daily. 01/15/23  Yes Jake Bathe, MD  ferrous sulfate 325 (65 FE) MG EC tablet Take 325 mg by mouth daily with breakfast.   Yes [provider]  gabapentin (NEURONTIN) 300 MG  capsule Take 300 mg by mouth 3 (three) times daily. 01/15/23  Yes [provider]  glucose blood (ACCU-CHEK GUIDE) test strip Check 3 times daily 02/22/22  Yes Shamleffer, Konrad Dolores, MD  Insulin Pen Needle 31G X 5 MM MISC 1 Device by Does not apply route in the morning, at noon, in the evening, and at bedtime. 11/03/21  Yes Shamleffer, Konrad Dolores, MD  ketoconazole (NIZORAL) 2 % cream Apply 1 Application topically daily as needed for irritation. 03/25/23  Yes [provider]  latanoprost (XALATAN) 0.005 % ophthalmic solution Place 1 drop into both eyes at bedtime.   Yes [provider]  nitroGLYCERIN (NITROSTAT) 0.4 MG SL tablet PLACE 1 TABLET (0.4 MG TOTAL) UNDER THE TONGUE EVERY 5 (FIVE) MINUTES AS NEEDED FOR CHEST PAIN. 11/26/22  Yes Skains, Veverly Fells, MD  NOVOLOG FLEXPEN 100 UNIT/ML FlexPen INJECT PER SLIDING SCALE AS DIRECTED , MAX 30 UNITS PER DAY. DISCARD PEN 28 DAYS AFTER OPENING Patient taking differently: Inject 0-4 Units into the skin 3 (three) times daily with meals. INJECT PER SLIDING SCALE AS DIRECTED ,  MAX 30 UNITS PER DAY. DISCARD PEN 28 DAYS AFTER OPENING 12/04/22  Yes Shamleffer, Konrad Dolores, MD  OXYGEN Inhale 2 L into the lungs continuous.   Yes [provider]  rosuvastatin (CRESTOR) 20 MG tablet Take 1 tablet (20 mg total) by mouth daily at 6 PM. 10/07/15  Yes Alm Bustard, MD  TRESIBA FLEXTOUCH 100 UNIT/ML FlexTouch Pen INJECT 30 UNITS INTO THE SKIN DAILY. 11/29/22  Yes Shamleffer, Konrad Dolores, MD  TRUE METRIX BLOOD GLUCOSE TEST test strip TEST THREE TIMES DAILY 12/11/21  Yes Shamleffer, Konrad Dolores, MD  Vitamin D, Ergocalciferol, (DRISDOL) 1.25 MG (50000 UT) CAPS capsule Take 1 capsule (50,000 Units total) by mouth every 7 (seven) days. Patient taking differently: Take 50,000 Units by mouth every 7 (seven) days. Thursdays 12/30/18  Yes Roswell Nickel, DO    Physical Exam: Constitutional: Moderately built and  nourished. Vitals:   04/15/23 2022 04/15/23 2032 04/15/23 2120 04/15/23 2141  BP: (!) 150/65 (!) 142/73 123/87   Pulse: (!) 59 (!) 59 60   Resp: 16 17 18    Temp:  98.6 F (37 C)  TempSrc:    Oral  SpO2: 100% 95% 100%    Eyes: Anicteric no pallor. ENMT: No discharge from the ears eyes nose or mouth. Neck: No mass felt.  No neck rigidity. Respiratory: No rhonchi or crepitations. Cardiovascular: S1-S2 heard. Abdomen: Soft nontender bowel sound present. Musculoskeletal: Right ankle is in a splint. Skin: No rash. Neurologic: Alert awake oriented time place and person.  Moves all extremities. Psychiatric: Appears normal.  Normal affect.   Labs on Admission: I have personally reviewed following labs and imaging studies  CBC: Recent Labs  Lab 04/15/23 1810  WBC 5.3  HGB 14.6  HCT 46.2*  MCV 93.7  PLT 119*   Basic Metabolic Panel: Recent Labs  Lab 04/15/23 1810  NA 144  K 3.1*  CL 117*  CO2 18*  GLUCOSE 124*  BUN 19  CREATININE 0.79  CALCIUM 7.2*   GFR: Estimated Creatinine Clearance: 72.4 mL/min (by C-G formula based on SCr of 0.79 mg/dL). Liver Function Tests: No results for input(s): "AST", "ALT", "ALKPHOS", "BILITOT", "PROT", "ALBUMIN" in the last 168 hours. No results for input(s): "LIPASE", "AMYLASE" in the last 168 hours. No results for input(s): "AMMONIA" in the last 168 hours. Coagulation Profile: No results for input(s): "INR", "PROTIME" in the last 168 hours. Cardiac Enzymes: No results for input(s): "CKTOTAL", "CKMB", "CKMBINDEX", "TROPONINI" in the last 168 hours. BNP (last 3 results) No results for input(s): "PROBNP" in the last 8760 hours. HbA1C: No results for input(s): "HGBA1C" in the last 72 hours. CBG: No results for input(s): "GLUCAP" in the last 168 hours. Lipid Profile: No results for input(s): "CHOL", "HDL", "LDLCALC", "TRIG", "CHOLHDL", "LDLDIRECT" in the last 72 hours. Thyroid Function Tests: No results for input(s): "TSH",  "T4TOTAL", "FREET4", "T3FREE", "THYROIDAB" in the last 72 hours. Anemia Panel: No results for input(s): "VITAMINB12", "FOLATE", "FERRITIN", "TIBC", "IRON", "RETICCTPCT" in the last 72 hours. Urine analysis:    Component Value Date/Time   COLORURINE YELLOW 04/29/2022 2105   APPEARANCEUR HAZY (A) 04/29/2022 2105   LABSPEC 1.021 04/29/2022 2105   PHURINE 6.0 04/29/2022 2105   GLUCOSEU >=500 (A) 04/29/2022 2105   HGBUR NEGATIVE 04/29/2022 2105   BILIRUBINUR NEGATIVE 04/29/2022 2105   KETONESUR NEGATIVE 04/29/2022 2105   PROTEINUR NEGATIVE 04/29/2022 2105   UROBILINOGEN 1.0 05/25/2014 2027   NITRITE NEGATIVE 04/29/2022 2105   LEUKOCYTESUR MODERATE (A) 04/29/2022 2105   Sepsis Labs: @LABRCNTIP (procalcitonin:4,lacticidven:4) )No results found for this or any previous visit (from the past 240 hours).   Radiological Exams on Admission: DG Ankle Right Port Result Date: 04/15/2023 CLINICAL DATA:  Ankle fracture, reduction. EXAM: PORTABLE RIGHT ANKLE - 2 VIEW COMPARISON:  Radiograph earlier today FINDINGS: Improved alignment of distal tibia and fibular fractures postreduction. Improved alignment of the ankle mortise which now appears congruent. Overlying splint material in place. IMPRESSION: Improved alignment of distal tibia and fibular fractures postreduction. Improved alignment of the ankle mortise. Electronically Signed   By: Narda Rutherford M.D.   On: 04/15/2023 22:48   DG Ankle Right Port Result Date: 04/15/2023 CLINICAL DATA:  Fall today with right lower leg deformity.  Pain. EXAM: PORTABLE RIGHT ANKLE - 2 VIEW; PORTABLE RIGHT TIBIA AND FIBULA - 2 VIEW COMPARISON:  None Available. FINDINGS: Ankle: Displaced oblique fracture of the distal fibula just proximal to the ankle mortise. Oblique displaced fracture through the medial malleolus. Lateral translation of the talus and distal fracture fragments with respect to the tibial plafond. Suspected posterior tibial tubercle fracture. Generalized soft  tissue  edema. Vascular calcifications. Remote healed second metatarsal fracture. Tibia/fibula: Proximal tibia and fibular intact. No fracture of the proximal lower leg. Distal soft tissue edema. Vascular calcifications are seen. IMPRESSION: 1. Displaced distal fibular and tibial fractures with lateral translation of the talus and distal fracture fragments with respect to the tibial plafond. 2. Suspected posterior tibial tubercle fracture. 3. No fracture of the proximal lower leg. Electronically Signed   By: Narda Rutherford M.D.   On: 04/15/2023 19:05   DG Tibia/Fibula Right Port Result Date: 04/15/2023 CLINICAL DATA:  Fall today with right lower leg deformity.  Pain. EXAM: PORTABLE RIGHT ANKLE - 2 VIEW; PORTABLE RIGHT TIBIA AND FIBULA - 2 VIEW COMPARISON:  None Available. FINDINGS: Ankle: Displaced oblique fracture of the distal fibula just proximal to the ankle mortise. Oblique displaced fracture through the medial malleolus. Lateral translation of the talus and distal fracture fragments with respect to the tibial plafond. Suspected posterior tibial tubercle fracture. Generalized soft tissue edema. Vascular calcifications. Remote healed second metatarsal fracture. Tibia/fibula: Proximal tibia and fibular intact. No fracture of the proximal lower leg. Distal soft tissue edema. Vascular calcifications are seen. IMPRESSION: 1. Displaced distal fibular and tibial fractures with lateral translation of the talus and distal fracture fragments with respect to the tibial plafond. 2. Suspected posterior tibial tubercle fracture. 3. No fracture of the proximal lower leg. Electronically Signed   By: Narda Rutherford M.D.   On: 04/15/2023 19:05   VAS [name] ABI WITH/WO TBI Result Date: 04/15/2023 LOWER EXTREMITY ARTERIAL DUPLEX STUDY Patient Name:  [name]  Date of Exam:   04/15/2023 Medical Rec #: [id number]         Accession #:    [id number] Date of Birth: 01/30/1950         Patient Gender: F Patient Age:   5 years  Exam Location:  [facility] Procedure:      VAS [name] LOWER EXTREMITY ARTERIAL DUPLEX Referring Phys: EMMA COLLINS --------------------------------------------------------------------------------  Indications: Rest pain, and peripheral artery disease.  Current ABI: Right: 0.66              Left:  Limitations: Body habitus, Imaging hardware/software limitations Performing Technologist: [name] RVT  Examination Guidelines: A complete evaluation includes B-mode imaging, spectral Doppler, color Doppler, and power Doppler as needed of all accessible portions of each vessel. Bilateral testing is considered an integral part of a complete examination. Limited examinations for reoccurring indications may be performed as noted.  +-----------+--------+-----+---------------+----------+--------+ RIGHT      PSV cm/sRatioStenosis       Waveform  Comments +-----------+--------+-----+---------------+----------+--------+ CFA Distal 165          30-49% stenosisbiphasic           +-----------+--------+-----+---------------+----------+--------+ DFA        67                          biphasic           +-----------+--------+-----+---------------+----------+--------+ SFA Prox   168          30-49% stenosisbiphasic           +-----------+--------+-----+---------------+----------+--------+ SFA Mid    45                          biphasic           +-----------+--------+-----+---------------+----------+--------+ SFA Distal 93  biphasic           +-----------+--------+-----+---------------+----------+--------+ POP Prox   90                          biphasic           +-----------+--------+-----+---------------+----------+--------+ POP Distal 218          50-74% stenosisbiphasic           +-----------+--------+-----+---------------+----------+--------+ TP Trunk   47                          biphasic            +-----------+--------+-----+---------------+----------+--------+ ATA Distal 84                          monophasic         +-----------+--------+-----+---------------+----------+--------+ PTA Distal 54                          monophasic         +-----------+--------+-----+---------------+----------+--------+ PERO Distal0            occluded       absent             +-----------+--------+-----+---------------+----------+--------+  +-----------+--------+-----+--------+----------+--------------+ LEFT       PSV cm/sRatioStenosisWaveform  Comments       +-----------+--------+-----+--------+----------+--------------+ CFA Distal 103                  biphasic                 +-----------+--------+-----+--------+----------+--------------+ DFA        125                  biphasic                 +-----------+--------+-----+--------+----------+--------------+ SFA Prox   131                  biphasic                 +-----------+--------+-----+--------+----------+--------------+ SFA Mid    106                  biphasic                 +-----------+--------+-----+--------+----------+--------------+ SFA Distal 89                   biphasic                 +-----------+--------+-----+--------+----------+--------------+ POP Prox   74                   biphasic                 +-----------+--------+-----+--------+----------+--------------+ POP Distal 67                   biphasic                 +-----------+--------+-----+--------+----------+--------------+ TP Trunk                                  not visualized +-----------+--------+-----+--------+----------+--------------+ ATA Distal 0            occludedabsent                   +-----------+--------+-----+--------+----------+--------------+  PTA Distal 53                   monophasic               +-----------+--------+-----+--------+----------+--------------+ PERO Distal0             occludedabsent                   +-----------+--------+-----+--------+----------+--------------+  Summary: Right: - Diffuse atherosclerotic changes noted throughout the femoral-popliteal arteries with areas of 30-49% and 50-74% stenoses. - No flow was observed at the anterior tibial artery distally. Left: - Diffuse atherosclerotic changes noted throughout the femoral-popliteal arteries with no evidence of significant stenosis. - No flow was observed at the anterior tibial or peroneal arteries distally.  See table(s) above for measurements and observations.    Preliminary       Assessment/Plan Principal Problem:   Ankle fracture Active Problems:   CAD (coronary artery disease)   Hypertension   Chronic systolic dysfunction of left ventricle   S/P CABG x 5   DVT (deep venous thrombosis) (HCC)   Sleep apnea   Type 2 diabetes mellitus with hyperglycemia, with long-term current use of insulin (HCC)    Right ankle fracture after mechanical fall -     Dr. Charlann Boxer orthopedic surgeon was consulted requested transfer to [facility].  Patient already has a splint in place.  Pain relief medications and further recommendation per orthopedics. History of DVT on Eliquis last dose was this morning.  Will keep patient on heparin until definitive plans from orthopedics. History of chronic combined CHF status post ICD on amiodarone appears compensated.  On Entresto and Farxiga. History of CAD status post CABG denies any chest pain.  On statins Zetia and Eliquis. Diabetes mellitus type 2 on insulin glargine 30 units in the morning.  Last hemoglobin A1c was 7 5 months ago. Hypokalemia replace and recheck. Sleep apnea on CPAP at bedtime. Peripheral arterial disease patient has had an ABI done earlier today showed reading of 0.6 on the right and noncompressible on the left.  Will need follow-up with vascular surgeon.  Since patient has right ankle fracture will need close monitoring and further management  and more than 2 midnight stay.   DVT prophylaxis: Heparin infusion.  Takes Eliquis at home. Code Status: Full code. Family Communication: Gust with patient. Disposition Plan: Medical floor. Consults called: Orthopedics. Admission status: Observation.

## 2023-04-15 NOTE — Progress Notes (Signed)
 Orthopedic Tech Progress Note Patient Details:  [name] [birth year] [id number]  Ortho Devices Type of Ortho Device: Stirrup splint, Post (short leg) splint Ortho Device/Splint Location: RLE Ortho Device/Splint Interventions: Ordered, Application, Adjustment   Post Interventions Patient Tolerated: Well  Darleen Crocker 04/15/2023, 8:28 PM

## 2023-04-16 ENCOUNTER — Encounter (HOSPITAL_COMMUNITY): Payer: Self-pay | Admitting: Internal Medicine

## 2023-04-16 ENCOUNTER — Telehealth: Payer: Self-pay | Admitting: Home Health

## 2023-04-16 ENCOUNTER — Observation Stay (HOSPITAL_COMMUNITY)

## 2023-04-16 DIAGNOSIS — E876 Hypokalemia: Secondary | ICD-10-CM | POA: Insufficient documentation

## 2023-04-16 DIAGNOSIS — S82851A Displaced trimalleolar fracture of right lower leg, initial encounter for closed fracture: Secondary | ICD-10-CM | POA: Diagnosis not present

## 2023-04-16 LAB — CBC
HCT: 45.6 % (ref 36.0–46.0)
Hemoglobin: 14.1 g/dL (ref 12.0–15.0)
MCH: 29.6 pg (ref 26.0–34.0)
MCHC: 30.9 g/dL (ref 30.0–36.0)
MCV: 95.8 fL (ref 80.0–100.0)
Platelets: 117 10*3/uL — ABNORMAL LOW (ref 150–400)
RBC: 4.76 MIL/uL (ref 3.87–5.11)
RDW: 15 % (ref 11.5–15.5)
WBC: 5.9 10*3/uL (ref 4.0–10.5)
nRBC: 0 % (ref 0.0–0.2)

## 2023-04-16 LAB — BASIC METABOLIC PANEL WITH GFR
Anion gap: 6 (ref 5–15)
BUN: 21 mg/dL (ref 8–23)
CO2: 25 mmol/L (ref 22–32)
Calcium: 9.3 mg/dL (ref 8.9–10.3)
Chloride: 109 mmol/L (ref 98–111)
Creatinine, Ser: 0.82 mg/dL (ref 0.44–1.00)
GFR, Estimated: >60 mL/min (ref >60)
Glucose, Bld: 156 mg/dL — ABNORMAL HIGH (ref 70–99)
Potassium: 4 mmol/L (ref 3.5–5.1)
Sodium: 140 mmol/L (ref 135–145)

## 2023-04-16 LAB — GLUCOSE, CAPILLARY
Glucose-Capillary: 181 mg/dL — ABNORMAL HIGH (ref 70–99)
Glucose-Capillary: 193 mg/dL — ABNORMAL HIGH (ref 70–99)

## 2023-04-16 LAB — SURGICAL PCR SCREEN
MRSA, PCR: NEGATIVE
Staphylococcus aureus: NEGATIVE

## 2023-04-16 LAB — VAS US ABI WITH/WO TBI
Left ABI: 1.39
Right ABI: 0.66

## 2023-04-16 LAB — CBG MONITORING, ED
Glucose-Capillary: 123 mg/dL — ABNORMAL HIGH (ref 70–99)
Glucose-Capillary: 208 mg/dL — ABNORMAL HIGH (ref 70–99)

## 2023-04-16 LAB — APTT: aPTT: 76 s — ABNORMAL HIGH (ref 24–36)

## 2023-04-16 LAB — HEPARIN LEVEL (UNFRACTIONATED): Heparin Unfractionated: 0.98 [IU]/mL — ABNORMAL HIGH (ref 0.30–0.70)

## 2023-04-16 MED ORDER — CHLORHEXIDINE GLUCONATE 4 % EX SOLN
60.0000 mL | Freq: Once | CUTANEOUS | Status: AC
  Administered 2023-04-17: 4 via TOPICAL
  Filled 2023-04-16: qty 60

## 2023-04-16 MED ORDER — HEPARIN (PORCINE) 25000 UT/250ML-% IV SOLN
1000.0000 [IU]/h | INTRAVENOUS | Status: AC
  Administered 2023-04-16: 1000 [IU]/h via INTRAVENOUS
  Filled 2023-04-16: qty 250

## 2023-04-16 MED ORDER — POTASSIUM CHLORIDE 20 MEQ PO PACK
40.0000 meq | PACK | Freq: Once | ORAL | Status: AC
  Administered 2023-04-16: 40 meq via ORAL
  Filled 2023-04-16: qty 2

## 2023-04-16 NOTE — Discharge Instructions (Signed)
 [name], MD [facility]  Please read the following information regarding your care after surgery.  Medications  You only need a prescription for the narcotic pain medicine (ex. oxycodone, Percocet, Norco).  All of the other medicines listed below are available over the counter. ? Aleve 2 pills twice a day for the first 3 days after surgery. ? acetominophen (Tylenol) 650 mg every 4-6 hours as you need for minor to moderate pain ? oxycodone as prescribed for severe pain.  Weight Bearing ? Do NOT bear any weight on the operated leg or foot. This means do NOT touch your surgical leg to the ground!  Cast / Splint / Dressing ? If you have a splint, do NOT remove this. Keep your splint, cast or dressing clean and dry.  Don't put anything (coat hanger, pencil, etc) down inside of it.  If it gets wet, call the office immediately to schedule an appointment for a cast change.  Swelling IMPORTANT: It is normal for you to have swelling where you had surgery. To reduce swelling and pain, keep at least 3 pillows under your leg so that your toes are above your nose and your heel is above the level of your hip.  It may be necessary to keep your foot or leg elevated for several weeks.  This is critical to helping your incisions heal and your pain to feel better.  Follow Up Call my office at [phone] when you are discharged from the hospital or surgery center to schedule an appointment to be seen 7-10 days after surgery.  Call my office at [phone] if you develop a fever >101.5 F, nausea, vomiting, bleeding from the surgical site or severe pain.

## 2023-04-16 NOTE — Progress Notes (Signed)
 PHARMACY - ANTICOAGULATION CONSULT NOTE  Pharmacy Consult for Heparin Indication:  VTE treatment while apixaban on hold  Allergies  Allergen Reactions   Metformin And Related Itching, Swelling and Other (See Comments)    Leg pain & swelling in legs   Ozempic (0.25 Or 0.5 Mg-Dose) [Semaglutide(0.25 Or 0.5mg -Dos)] Other (See Comments)    Pt with hx of pancreatitis    Atorvastatin Calcium Itching and Rash   Levofloxacin Itching    Patient Measurements:    Vital Signs: Temp: 98.6 F (37 C) (04/07 2141) Temp Source: Oral (04/07 2141) BP: 123/87 (04/07 2120) Pulse Rate: 60 (04/07 2120)  Labs: Recent Labs    04/15/23 1810  HGB 14.6  HCT 46.2*  PLT 119*  CREATININE 0.79    Estimated Creatinine Clearance: 72.4 mL/min (by C-G formula based on SCr of 0.79 mg/dL).   Medical History: Past Medical History:  Diagnosis Date   Abnormal LFTs    AICD (automatic cardioverter/defibrillator) present    Angina decubitus (HCC) 11/06/2012   Angina pectoris (HCC)    Anxiety    Arthritis    Back pain    Bundle branch block 05/2016   CAD (coronary artery disease) 06/18/2010   CHF (congestive heart failure) (HCC)    Chronic combined systolic and diastolic CHF, NYHA class 3 (HCC)    Chronic combined systolic and diastolic heart failure (HCC)    Chronic systolic dysfunction of left ventricle 09/24/2012   Coronary artery disease    s/p CABG 2007   Depression    Dermal mycosis    Diabetes mellitus    type II   DKA (diabetic ketoacidoses) 01/12/2019   DVT (deep venous thrombosis) (HCC) 09/2015   bilateral   Edema, lower extremity    Glaucoma    Hyperlipidemia    Hypertension    Hypoglycemia 09/09/2012   Insomnia    Insulin dependent diabetes mellitus (HCC) 04/23/2012   Ischemic cardiomyopathy    EF 30-35%, s/p ICD 4/08 by Dr Amil Amen   Joint pain    LBBB (left bundle branch block) 09/24/2012   Morbid obesity (HCC) 11/06/2012   Obesity    Oxygen dependent 2 L   Pain in left  knee    Peripheral neuropathy    Peripheral vascular disease (HCC)    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  She is on apixaban PTA (last dose 4/7 @ 8a) for hx VTE which is being held for possible surgery. Pharmacy consulted to dose IV heparin. Baseline Hg WNL, pltc low at 119 but appears to be patient's baseline, Scr <0.8. Anticipate elevated heparin level due to Eliquis - lab interaction.  Will monitor heparin therapy using aPTTs until aPTT and heparin levels correlate.  Goal of Therapy:  Heparin level 0.3-0.7 units/ml aPTT 66-102 seconds Monitor platelets by anticoagulation protocol: Yes   Plan:  Start IV heparin 1000 units/hr.  No bolus since  on Eliquis PTA.  Check 8h aPTT Daily heparin level & CBC while on heparin  Junita Push PharmD 04/16/2023,12:02 AM

## 2023-04-16 NOTE — Progress Notes (Signed)
   04/16/23 1957  BiPAP/CPAP/SIPAP  Reason BIPAP/CPAP not in use Non-compliant   Pt refused cpap tonight.  Pt stated she does not have a cpap machine and only uses oxygen at home.  Pt prefers to wear oxygen (nasal cannula) while here in the hospital.  RN aware.

## 2023-04-16 NOTE — H&P (Signed)
 Patient ID: [name] MRN: [id number] DOB/AGE: May 03, 1950 73 y.o.  Admit date: 04/15/2023  Admission Diagnoses:  Principal Problem:   Ankle fracture Active Problems:   CAD (coronary artery disease)   Hypertension   Chronic systolic dysfunction of left ventricle   S/P CABG x 5   DVT (deep venous thrombosis) (HCC)   Sleep apnea   Type 2 diabetes mellitus with hyperglycemia, with long-term current use of insulin (HCC)   Hypokalemia   HPI: Ortho consult for right distal tibia pilon-fibula ankle fx sustained 04/16/23.  PMH notable for HFrEF, ICM s/p ICD,VT/VF, LBBB, CAD s/p CABG, DVT, DM, OSA.  Denies numbness/tingling.  Past Medical History: Past Medical History:  Diagnosis Date   Abnormal LFTs    AICD (automatic cardioverter/defibrillator) present    Angina decubitus (HCC) 11/06/2012   Angina pectoris (HCC)    Anxiety    Arthritis    Back pain    Bundle branch block 05/2016   CAD (coronary artery disease) 06/18/2010   CHF (congestive heart failure) (HCC)    Chronic combined systolic and diastolic CHF, NYHA class 3 (HCC)    Chronic combined systolic and diastolic heart failure (HCC)    Chronic systolic dysfunction of left ventricle 09/24/2012   Coronary artery disease    s/p CABG 2007   Depression    Dermal mycosis    Diabetes mellitus    type II   DKA (diabetic ketoacidoses) 01/12/2019   DVT (deep venous thrombosis) (HCC) 09/2015   bilateral   Edema, lower extremity    Glaucoma    Hyperlipidemia    Hypertension    Hypoglycemia 09/09/2012   Insomnia    Insulin dependent diabetes mellitus (HCC) 04/23/2012   Ischemic cardiomyopathy    EF 30-35%, s/p ICD 4/08 by Dr Amil Amen   Joint pain    LBBB (left bundle branch block) 09/24/2012   Morbid obesity (HCC) 11/06/2012   Obesity    Oxygen dependent 2 L   Pain in left knee    Peripheral neuropathy    Peripheral vascular disease (HCC)    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   LIMA to D2, SVG to ramus intermediate,  sequential SVG to OM1-OM2, SVG to RCA with EVH via both legs    Sleep apnea    uses O2 at night   Tinea    Type 2 diabetes mellitus with hyperglycemia, with long-term current use of insulin (HCC) 04/20/2019   Uncontrolled diabetes mellitus 02/2019   Ventricular tachycardia (HCC) 01/16/2015   sustained VT terminated with ATP, CL 340 msec   Vitamin B 12 deficiency 11/05/2018   Vitamin D deficiency 11/05/2018    Surgical History: Past Surgical History:  Procedure Laterality Date   BI-VENTRICULAR IMPLANTABLE CARDIOVERTER DEFIBRILLATOR UPGRADE N/A 09/25/2012   Procedure: BI-VENTRICULAR IMPLANTABLE CARDIOVERTER DEFIBRILLATOR UPGRADE;  Surgeon: Gardiner Rhyme, MD;  Location: [facility] CATH LAB;  Service: Cardiovascular;  Laterality: N/A;   BIV ICD GENERATOR CHANGEOUT N/A 12/11/2022   Procedure: BIV ICD GENERATOR CHANGEOUT;  Surgeon: Lanier Prude, MD;  Location: [facility] INVASIVE CV LAB;  Service: Cardiovascular;  Laterality: N/A;   BIV ICD GENERATOR CHANGEOUT N/A 12/28/2022   Procedure: BIV ICD GENERATOR CHANGEOUT;  Surgeon: Lanier Prude, MD;  Location: [facility] INVASIVE CV LAB;  Service: Cardiovascular;  Laterality: N/A;   BIV UPGRADE Left 12/28/2022   Procedure: BIV UPGRADE;  Surgeon: Lanier Prude, MD;  Location: [facility] INVASIVE CV LAB;  Service: Cardiovascular;  Laterality: Left;   BREAST BIOPSY Right 04/05/2014  CARDIAC CATHETERIZATION     CARDIAC DEFIBRILLATOR PLACEMENT  4/08   by Dr Amil Amen (MDT)   COLONOSCOPY WITH PROPOFOL N/A 10/12/2016   Procedure: COLONOSCOPY WITH PROPOFOL;  Surgeon: Sherrilyn Rist, MD;  Location: [facility] ENDOSCOPY;  Service: Gastroenterology;  Laterality: N/A;   CORONARY ARTERY BYPASS GRAFT  10/05/2005   by Dr Laneta Simmers   IMPLANTABLE CARDIOVERTER DEFIBRILLATOR IMPLANT  09/25/12   attempt of upgrade to CRT-D unsuccessful due to CS anatomy, SJM Unify Asaura device placed with LV port capped by Dr Johney Frame   IR GENERIC HISTORICAL  10/03/2015   IR US GUIDE VASC ACCESS LEFT  10/03/2015 Malachy Moan, MD [facility]   IR GENERIC HISTORICAL  10/03/2015   IR VENO/EXT/UNI LEFT 10/03/2015 Malachy Moan, MD [facility]   IR GENERIC HISTORICAL  10/03/2015   IR VENOCAVAGRAM IVC 10/03/2015 Malachy Moan, MD [facility]   IR GENERIC HISTORICAL  10/03/2015   IR INFUSION THROMBOL VENOUS INITIAL (MS) 10/03/2015 Malachy Moan, MD [facility]   IR GENERIC HISTORICAL  10/03/2015   IR US GUIDE VASC ACCESS LEFT 10/03/2015 Malachy Moan, MD [facility]   IR GENERIC HISTORICAL  10/04/2015   IR THROMBECT VENO MECH MOD SED 10/04/2015 Simonne Come, MD [facility]   IR GENERIC HISTORICAL  10/04/2015   IR TRANSCATH PLC STENT 1ST ART NOT LE CV CAR VERT CAR 10/04/2015 Simonne Come, MD [facility]   IR GENERIC HISTORICAL  10/04/2015   IR THROMB F/U EVAL ART/VEN FINAL DAY (MS) 10/04/2015 Simonne Come, MD [facility]   IR GENERIC HISTORICAL  11/02/2015   IR RADIOLOGIST EVAL & MGMT 11/02/2015 Simonne Come, MD [facility] INTER[facility]   IR RADIOLOGIST EVAL & MGMT  04/26/2016   LEFT HEART CATH AND CORS/GRAFTS ANGIOGRAPHY N/A 01/19/2019   Procedure: LEFT HEART CATH AND CORS/GRAFTS ANGIOGRAPHY;  Surgeon: Lyn Records, MD;  Location: [facility] INVASIVE CV LAB;  Service: Cardiovascular;  Laterality: N/A;   LEFT HEART CATH AND CORS/GRAFTS ANGIOGRAPHY N/A 01/15/2022   Procedure: LEFT HEART CATH AND CORS/GRAFTS ANGIOGRAPHY;  Surgeon: Runell Gess, MD;  Location: [facility] INVASIVE CV LAB;  Service: Cardiovascular;  Laterality: N/A;    Family History: Family History  Problem Relation Age of Onset   Diabetes Mother 67       died - HTN   Stroke Mother    High blood pressure Mother    Sudden death Mother    Obesity Mother    Other Other        No early family hx of CAD    Social History: Social History   Socioeconomic History   Marital status: Legally Separated    Spouse name: Not on file   Number of children: 3   Years of education: 17   Highest education level: Not on file  Occupational  History   Occupation: retired    Associate Professor: UNEMPLOYED  Tobacco Use   Smoking status: Never   Smokeless tobacco: Never  Vaping Use   Vaping status: Never Used  Substance and Sexual Activity   Alcohol use: No   Drug use: No   Sexual activity: Not Currently  Other Topics Concern   Not on file  Social History Narrative   Disabled Producer, television/film/video.  Currently taking sociology classes at [employer].   11/04/15 Lives with son    caffeine - coffee, 1-2 cups daily   Social Drivers of Health   Financial Resource Strain: Low Risk  (01/15/2022)   Overall Financial Resource Strain (CARDIA)  Difficulty of Paying Living Expenses: Not very hard  Food Insecurity: Food Insecurity Present (04/28/2022)   Hunger Vital Sign    Worried About Running Out of Food in the Last Year: Sometimes true    Ran Out of Food in the Last Year: Never true  Transportation Needs: No Transportation Needs (04/28/2022)   PRAPARE - Administrator, Civil Service (Medical): No    Lack of Transportation (Non-Medical): No  Physical Activity: Not on file  Stress: Not on file  Social Connections: Unknown (05/21/2021)   Received [facility]   Social Network    Social Network: Not on file  Intimate Partner Violence: Not At Risk (04/28/2022)   Humiliation, Afraid, Rape, and Kick questionnaire    Fear of Current or Ex-Partner: No    Emotionally Abused: No    Physically Abused: No    Sexually Abused: No    Allergies: Metformin and related, Ozempic (0.25 or 0.5 mg-dose) [semaglutide(0.25 or 0.5mg -dos)], Atorvastatin calcium, and Levofloxacin  Medications: I have reviewed the patient's current medications.  Vital Signs: Patient Vitals for the past 24 hrs:  BP Temp Temp src Pulse Resp SpO2  04/16/23 1456 135/72 98 F (36.7 C) Oral 65 16 100 %  04/16/23 1350 135/72 98 F (36.7 C) Oral 65 16 100 %  04/16/23 1139 (!) 148/68 -- -- 65 16 99 %  04/16/23 0933 -- 97.9 F (36.6 C) Oral -- -- --   04/16/23 0500 (!) 141/67 98 F (36.7 C) Oral 65 13 100 %  04/16/23 0400 -- -- -- 62 18 98 %  04/16/23 0122 -- 98.3 F (36.8 C) Oral -- -- --  04/16/23 0100 (!) 152/73 -- -- (!) 59 18 100 %  04/15/23 2141 -- 98.6 F (37 C) Oral -- -- --  04/15/23 2120 123/87 -- -- 60 18 100 %  04/15/23 2032 (!) 142/73 -- -- (!) 59 17 95 %  04/15/23 2022 (!) 150/65 -- -- (!) 59 16 100 %  04/15/23 2017 (!) 154/88 -- -- 62 20 94 %    Radiology: CT ANKLE RIGHT WO CONTRAST Result Date: 04/16/2023 CLINICAL DATA:  Right ankle fractures. EXAM: CT OF THE RIGHT ANKLE WITHOUT CONTRAST TECHNIQUE: Multidetector CT imaging of the right ankle was performed according to the standard protocol. Multiplanar CT image reconstructions were also generated. RADIATION DOSE REDUCTION: This exam was performed according to the departmental dose-optimization program which includes automated exposure control, adjustment of the mA and/or kV according to patient size and/or use of iterative reconstruction technique. COMPARISON:  Radiographs 04/15/2023 FINDINGS: Complex comminuted trimalleolar ankle fractures as demonstrated on the prior radiographs. Is a spiral type fracture of the distal fibular shaft above the level of the ankle mortise and maximum lateral displacement approximately 6 mm. Mildly displaced oblique coursing fracture through base of the medial malleolus with maximum displacement 3 mm. Longitudinal/oblique fracture involving the posterior malleolus of the tibia with maximum separation at the articular surface of 3 mm. There is also a displaced intra-articular fracture involving the posterior and lateral aspect of the tibial plateau. Maximum separation at the articular surface is 6 mm. Evidence of remote osteochondral lesion involving the medial talar dome with cystic changes and mild sclerosis. Associated mild tibiotalar degenerative changes. No talar fracture. The subtalar joints are maintained. Subchondral degenerative type cystic  changes are noted in the navicular bone and also in the medial and middle cuneiform is. Extensive small vessel calcifications consistent with diabetes. Grossly by CT the  major ankle tendons are intact. IMPRESSION: 1. Complex comminuted ankle fractures as detailed above. Four separate fractures are identified. 2. Remote osteochondral lesion involving the medial talar dome. 3. Extensive small vessel calcifications consistent with diabetes. Electronically Signed   By: Rudie Meyer M.D.   On: 04/16/2023 16:17   VAS [name] LOWER EXTREMITY ARTERIAL DUPLEX Result Date: 04/16/2023 LOWER EXTREMITY ARTERIAL DUPLEX STUDY Patient Name:  Crystallee Werden  Date of Exam:   04/15/2023 Medical Rec #: [id number]         Accession #:    [id number] Date of Birth: 21-Aug-1950         Patient Gender: F Patient Age:   76 years Exam Location:  [facility] Procedure:      VAS [name] LOWER EXTREMITY ARTERIAL DUPLEX Referring Phys: EMMA COLLINS --------------------------------------------------------------------------------  Indications: Rest pain, and peripheral artery disease.  Current ABI: Right: 0.66              Left: [place] Limitations: Body habitus, Imaging hardware/software limitations Performing Technologist: [name] RVT  Examination Guidelines: A complete evaluation includes B-mode imaging, spectral Doppler, color Doppler, and power Doppler as needed of all accessible portions of each vessel. Bilateral testing is considered an integral part of a complete examination. Limited examinations for reoccurring indications may be performed as noted.  +-----------+--------+-----+---------------+----------+--------+ RIGHT      PSV cm/sRatioStenosis       Waveform  Comments +-----------+--------+-----+---------------+----------+--------+ CFA Distal 165          30-49% stenosisbiphasic           +-----------+--------+-----+---------------+----------+--------+ DFA        67                          biphasic            +-----------+--------+-----+---------------+----------+--------+ SFA Prox   168          30-49% stenosisbiphasic           +-----------+--------+-----+---------------+----------+--------+ SFA Mid    45                          biphasic           +-----------+--------+-----+---------------+----------+--------+ SFA Distal 93                          biphasic           +-----------+--------+-----+---------------+----------+--------+ POP Prox   90                          biphasic           +-----------+--------+-----+---------------+----------+--------+ POP Distal 218          50-74% stenosisbiphasic           +-----------+--------+-----+---------------+----------+--------+ TP Trunk   47                          biphasic           +-----------+--------+-----+---------------+----------+--------+ ATA Distal 84                          monophasic         +-----------+--------+-----+---------------+----------+--------+ PTA Distal 54  monophasic         +-----------+--------+-----+---------------+----------+--------+ PERO Distal0            occluded       absent             +-----------+--------+-----+---------------+----------+--------+  +-----------+--------+-----+--------+----------+--------------+ LEFT       PSV cm/sRatioStenosisWaveform  Comments       +-----------+--------+-----+--------+----------+--------------+ CFA Distal 103                  biphasic                 +-----------+--------+-----+--------+----------+--------------+ DFA        125                  biphasic                 +-----------+--------+-----+--------+----------+--------------+ SFA Prox   131                  biphasic                 +-----------+--------+-----+--------+----------+--------------+ SFA Mid    106                  biphasic                 +-----------+--------+-----+--------+----------+--------------+ SFA  Distal 89                   biphasic                 +-----------+--------+-----+--------+----------+--------------+ POP Prox   74                   biphasic                 +-----------+--------+-----+--------+----------+--------------+ POP Distal 67                   biphasic                 +-----------+--------+-----+--------+----------+--------------+ TP Trunk                                  not visualized +-----------+--------+-----+--------+----------+--------------+ ATA Distal 0            occludedabsent                   +-----------+--------+-----+--------+----------+--------------+ PTA Distal 53                   monophasic               +-----------+--------+-----+--------+----------+--------------+ PERO Distal0            occludedabsent                   +-----------+--------+-----+--------+----------+--------------+  Summary: Right: - Diffuse atherosclerotic changes noted throughout the femoral-popliteal arteries with areas of 30-49% and 50-74% stenoses. - No flow was observed at the anterior tibial artery distally. Left: - Diffuse atherosclerotic changes noted throughout the femoral-popliteal arteries with no evidence of significant stenosis. - No flow was observed at the anterior tibial or peroneal arteries distally.  See table(s) above for measurements and observations. Electronically signed by Sherald Hess MD on 04/16/2023 at 10:45:05 AM.    Final (Updated)    VAS [name] ABI WITH/WO TBI Result Date: 04/16/2023  LOWER EXTREMITY DOPPLER STUDY Patient Name:  Tambi Thole  Date of Exam:  04/15/2023 Medical Rec #: [id number]         Accession #:    [id number] Date of Birth: 1950/08/18         Patient Gender: F Patient Age:   40 years Exam Location:  [facility] Procedure:      VAS [name] ABI WITH/WO TBI Referring Phys: EMMA COLLINS --------------------------------------------------------------------------------  Indications: Rest pain, and  peripheral artery disease. High Risk Factors: Hypertension, hyperlipidemia, Diabetes.  Limitations: Today's exam was limited due to Body habitus/non-compressible              vessels. Comparison Study: 02/01/23 Performing Technologist: [name] RVT  Examination Guidelines: A complete evaluation includes at minimum, Doppler waveform signals and systolic blood pressure reading at the level of bilateral brachial, anterior tibial, and posterior tibial arteries, when vessel segments are accessible. Bilateral testing is considered an integral part of a complete examination. Photoelectric Plethysmograph (PPG) waveforms and toe systolic pressure readings are included as required and additional duplex testing as needed. Limited examinations for reoccurring indications may be performed as noted.  ABI Findings: +---------+------------------+-----+----------+--------+ Right    Rt Pressure (mmHg)IndexWaveform  Comment  +---------+------------------+-----+----------+--------+ Brachial 184                                       +---------+------------------+-----+----------+--------+ PTA      121               0.66 biphasic           +---------+------------------+-----+----------+--------+ DP       113               0.61 monophasic         +---------+------------------+-----+----------+--------+ Great Toe89                0.48 Abnormal           +---------+------------------+-----+----------+--------+ +---------+------------------+-----+----------+------------+ Left     Lt Pressure (mmHg)IndexWaveform  Comment      +---------+------------------+-----+----------+------------+ Brachial                                  n/a - device +---------+------------------+-----+----------+------------+ PTA      255               1.39 monophasic             +---------+------------------+-----+----------+------------+ DP       255               1.39 monophasic              +---------+------------------+-----+----------+------------+ Great Toe77                0.42 Abnormal               +---------+------------------+-----+----------+------------+ +-------+-----------+-----------+------------+------------+ ABI/TBIToday's ABIToday's TBIPrevious ABIPrevious TBI +-------+-----------+-----------+------------+------------+ Right  0.66       0.48       0.82        0.64         +-------+-----------+-----------+------------+------------+ Left   1.39       0.42       0.57        0.62         +-------+-----------+-----------+------------+------------+  Summary: Right: Resting right ankle-brachial index indicates moderate right lower extremity arterial disease. The right toe-brachial index is abnormal. Left: Resting left ankle-brachial  index indicates noncompressible left lower extremity arteries. The left toe-brachial index is abnormal. Arterial wall calcification precludes accurate ankle pressures and ABIs.  *See table(s) above for measurements and observations.  Electronically signed by Sherald Hess MD on 04/16/2023 at 10:44:36 AM.    Final    DG Ankle Right Port Result Date: 04/15/2023 CLINICAL DATA:  Ankle fracture, reduction. EXAM: PORTABLE RIGHT ANKLE - 2 VIEW COMPARISON:  Radiograph earlier today FINDINGS: Improved alignment of distal tibia and fibular fractures postreduction. Improved alignment of the ankle mortise which now appears congruent. Overlying splint material in place. IMPRESSION: Improved alignment of distal tibia and fibular fractures postreduction. Improved alignment of the ankle mortise. Electronically Signed   By: Narda Rutherford M.D.   On: 04/15/2023 22:48   DG Ankle Right Port Result Date: 04/15/2023 CLINICAL DATA:  Fall today with right lower leg deformity.  Pain. EXAM: PORTABLE RIGHT ANKLE - 2 VIEW; PORTABLE RIGHT TIBIA AND FIBULA - 2 VIEW COMPARISON:  None Available. FINDINGS: Ankle: Displaced oblique fracture of the distal fibula just  proximal to the ankle mortise. Oblique displaced fracture through the medial malleolus. Lateral translation of the talus and distal fracture fragments with respect to the tibial plafond. Suspected posterior tibial tubercle fracture. Generalized soft tissue edema. Vascular calcifications. Remote healed second metatarsal fracture. Tibia/fibula: Proximal tibia and fibular intact. No fracture of the proximal lower leg. Distal soft tissue edema. Vascular calcifications are seen. IMPRESSION: 1. Displaced distal fibular and tibial fractures with lateral translation of the talus and distal fracture fragments with respect to the tibial plafond. 2. Suspected posterior tibial tubercle fracture. 3. No fracture of the proximal lower leg. Electronically Signed   By: Narda Rutherford M.D.   On: 04/15/2023 19:05   DG Tibia/Fibula Right Port Result Date: 04/15/2023 CLINICAL DATA:  Fall today with right lower leg deformity.  Pain. EXAM: PORTABLE RIGHT ANKLE - 2 VIEW; PORTABLE RIGHT TIBIA AND FIBULA - 2 VIEW COMPARISON:  None Available. FINDINGS: Ankle: Displaced oblique fracture of the distal fibula just proximal to the ankle mortise. Oblique displaced fracture through the medial malleolus. Lateral translation of the talus and distal fracture fragments with respect to the tibial plafond. Suspected posterior tibial tubercle fracture. Generalized soft tissue edema. Vascular calcifications. Remote healed second metatarsal fracture. Tibia/fibula: Proximal tibia and fibular intact. No fracture of the proximal lower leg. Distal soft tissue edema. Vascular calcifications are seen. IMPRESSION: 1. Displaced distal fibular and tibial fractures with lateral translation of the talus and distal fracture fragments with respect to the tibial plafond. 2. Suspected posterior tibial tubercle fracture. 3. No fracture of the proximal lower leg. Electronically Signed   By: Narda Rutherford M.D.   On: 04/15/2023 19:05   CUP PACEART INCLINIC DEVICE  CHECK Result Date: 04/03/2023 Normal in-clinic CRT-D (multi-lead) check. Presenting Rhythm: ASBiV / APBiV . Routine testing was performed. Thresholds, sensing, impedance trend were stable and no changes were required. HF diagnostics are stable. No treated arrhythmias. Patient BiV pacing 94% of the time with 4% PVC's. Estimated longevity 6.4-6.7 years. With industry support, adjusted pulse width to 0.4 ms on A / RV & LV given excellent thresholds for battery life.  In addition, rate response turned on. No mode switches.  Pt enrolled in remote follow-up. bo  CUP PACEART REMOTE DEVICE CHECK Result Date: 04/03/2023 Scheduled remote reviewed. Normal device function.  Presenting rhythm: AP/BiV pace with PVC's Next remote 91 days. LA, CVRS   Labs: Recent Labs    04/15/23 1810 04/16/23 0526  WBC  5.3 5.9  RBC 4.93 4.76  HCT 46.2* 45.6  PLT 119* 117*   Recent Labs    04/15/23 1810 04/16/23 0526  NA 144 140  K 3.1* 4.0  CL 117* 109  CO2 18* 25  BUN 19 21  CREATININE 0.79 0.82  GLUCOSE 124* 156*  CALCIUM 7.2* 9.3   No results for input(s): "LABPT", "INR" in the last 72 hours.  Review of Systems: ROS as detailed in HPI  Physical Exam: There is no height or weight on file to calculate BMI.  Physical Exam   Gen: AAOx3, NAD Comfortable at rest  Right lower extremity: Short leg splint in place Wiggles toes SILT over toes CR<2s    Assessment and Plan: Ortho consult for right distal tibia pilon-fibula ankle fx sustained 04/16/23   -history, exam and imaging reviewed at length with patient/family -advise right distal tibia and fibula ORIF, possible syndesmosis and/or deltoid fixation, possible allograft, possible external fixation -preop clearance by primary team/Cards -NPO and VTE ppx held from MN -PT/OT postop  [name], MD Orthopaedic Surgeon [facility] [phone]  The risks and benefits were presented and reviewed. The risks due to hardware  failure/irritation, new/persistent/recurrent infection, stiffness, nerve/vessel/tendon injury, nonunion/malunion of any fracture, wound healing issues, allograft usage, development of arthritis, failure of this surgery, possibility of external fixation in certain situations, possibility of delayed definitive surgery, need for further surgery, prolonged wound care including further soft tissue coverage procedures, thromboembolic events, anesthesia/medical complications/events perioperatively and beyond, amputation, death among others were discussed. The patient acknowledged the explanation, agreed to proceed with the plan and a consent was signed.

## 2023-04-16 NOTE — Telephone Encounter (Signed)
 Patient called after hour line, states she needs to have ankle surgery tomorrow and is wondering if she can have surgery, she states she has been doing well otherwise, no chest pain or worsening of chronic SOB with exertion.  Chart reviewed, she has extensive medical history with HFrEF, ICM s/p ICD,VT/VF, LBBB, CAD s/p CABG, DVT, DM, OSA. She would be at moderate to high risk for peri-operative complication, there is not tool we can use to predict how she does with anesthesia but she will need close monitor peri-operatively. She verbalized understanding.

## 2023-04-16 NOTE — Progress Notes (Signed)
 Patient ID: [name], female   DOB: 11/01/1950, 73 y.o.   MRN: [id number]  73 yo female with ground level fall resulting in right ankle fracture  Fracture reduced and splinted in the ER but due to pain and mobility she was unable to be discharged home with outpatient follow up.  Admitting to medicine team I will reach out to my colleagues to see about having this addressed during this admission She will be non weight bearing for at least 6 weeks - for planning purposes NPO for now in case of possible OR today

## 2023-04-16 NOTE — Progress Notes (Incomplete)
 PHARMACY - ANTICOAGULATION CONSULT NOTE  Pharmacy Consult for Heparin Indication: ***  Allergies  Allergen Reactions  . Metformin And Related Itching, Swelling and Other (See Comments)    Leg pain & swelling in legs  . Ozempic (0.25 Or 0.5 Mg-Dose) [Semaglutide(0.25 Or 0.5mg -Dos)] Other (See Comments)    Pt with hx of pancreatitis   . Atorvastatin Calcium Itching and Rash  . Levofloxacin Itching    Patient Measurements:    Vital Signs: Temp: 98.6 F (37 C) (04/07 2141) Temp Source: Oral (04/07 2141) BP: 123/87 (04/07 2120) Pulse Rate: 60 (04/07 2120)  Labs: Recent Labs    04/15/23 1810  HGB 14.6  HCT 46.2*  PLT 119*  CREATININE 0.79    Estimated Creatinine Clearance: 72.4 mL/min (by C-G formula based on SCr of 0.79 mg/dL).   Medical History: Past Medical History:  Diagnosis Date  . Abnormal LFTs   . AICD (automatic cardioverter/defibrillator) present   . Angina decubitus (HCC) 11/06/2012  . Angina pectoris (HCC)   . Anxiety   . Arthritis   . Back pain   . Bundle branch block 05/2016  . CAD (coronary artery disease) 06/18/2010  . CHF (congestive heart failure) (HCC)   . Chronic combined systolic and diastolic CHF, NYHA class 3 (HCC)   . Chronic combined systolic and diastolic heart failure (HCC)   . Chronic systolic dysfunction of left ventricle 09/24/2012  . Coronary artery disease    s/p CABG 2007  . Depression   . Dermal mycosis   . Diabetes mellitus    type II  . DKA (diabetic ketoacidoses) 01/12/2019  . DVT (deep venous thrombosis) (HCC) 09/2015   bilateral  . Edema, lower extremity   . Glaucoma   . Hyperlipidemia   . Hypertension   . Hypoglycemia 09/09/2012  . Insomnia   . Insulin dependent diabetes mellitus (HCC) 04/23/2012  . Ischemic cardiomyopathy    EF 30-35%, s/p ICD 4/08 by Dr Amil Amen  . Joint pain   . LBBB (left bundle branch block) 09/24/2012  . Morbid obesity (HCC) 11/06/2012  . Obesity   . Oxygen dependent 2 L  . Pain in  left knee   . Peripheral neuropathy   . Peripheral vascular disease (HCC)   . 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 Sleep apnea    uses O2 at night  . Tinea   . Type 2 diabetes mellitus with hyperglycemia, with long-term current use of insulin (HCC) 04/20/2019  . Uncontrolled diabetes mellitus 02/2019  . Ventricular tachycardia (HCC) 01/16/2015   sustained VT terminated with ATP, CL 340 msec  . Vitamin B 12 deficiency 11/05/2018  . Vitamin D deficiency 11/05/2018    Medications:  {Meds:3041526:p}  Assessment: *** Goal of Therapy:  {ZOXWR:6045409} {Monitor platelets by anticoagulation protocol:3041561::"Monitor platelets by anticoagulation protocol: Yes"}   Plan:  {WJXB:1478295}  Junita Push 04/16/2023,12:02 AM

## 2023-04-16 NOTE — ED Notes (Signed)
 [name] PIV placed.

## 2023-04-16 NOTE — Care Plan (Addendum)
 Orthopaedic Surgery Plan of Care Note   -history and imaging reviewed at request of colleagues -pt has right trimalleolar ankle fx s/p closed reduction in ER -admitted to hospitalist team -please keep NPO and hold VTE ppx from [place], added on for ORIF on Wed 04/17/23 -please obtain CT right ankle for preop planning -full consult note to follow   [name], MD Orthopaedic Surgery [facility]

## 2023-04-16 NOTE — ED Notes (Addendum)
 Multiple nurses and techs have tried to obtain 0900 blood draw and been unsuccessful. Called phlebotomy

## 2023-04-16 NOTE — Progress Notes (Signed)
 PHARMACY - ANTICOAGULATION CONSULT NOTE  Pharmacy Consult for Heparin Indication:  VTE treatment while apixaban on hold  Allergies  Allergen Reactions   Metformin And Related Itching, Swelling and Other (See Comments)    Leg pain & swelling in legs   Ozempic (0.25 Or 0.5 Mg-Dose) [Semaglutide(0.25 Or 0.5mg -Dos)] Other (See Comments)    Pt with hx of pancreatitis    Atorvastatin Calcium Itching and Rash   Levofloxacin Itching    Patient Measurements:    Vital Signs: Temp: 97.9 F (36.6 C) (04/08 0933) Temp Source: Oral (04/08 0933) BP: 148/68 (04/08 1139) Pulse Rate: 65 (04/08 1139)  Labs: Recent Labs    04/15/23 1810 04/16/23 0526 04/16/23 1356  HGB 14.6 14.1  --   HCT 46.2* 45.6  --   PLT 119* 117*  --   APTT  --   --  76*  HEPARINUNFRC  --   --  0.98*  CREATININE 0.79 0.82  --     Estimated Creatinine Clearance: 70.6 mL/min (by C-G formula based on SCr of 0.82 mg/dL).   Medical History: Past Medical History:  Diagnosis Date   Abnormal LFTs    AICD (automatic cardioverter/defibrillator) present    Angina decubitus (HCC) 11/06/2012   Angina pectoris (HCC)    Anxiety    Arthritis    Back pain    Bundle branch block 05/2016   CAD (coronary artery disease) 06/18/2010   CHF (congestive heart failure) (HCC)    Chronic combined systolic and diastolic CHF, NYHA class 3 (HCC)    Chronic combined systolic and diastolic heart failure (HCC)    Chronic systolic dysfunction of left ventricle 09/24/2012   Coronary artery disease    s/p CABG 2007   Depression    Dermal mycosis    Diabetes mellitus    type II   DKA (diabetic ketoacidoses) 01/12/2019   DVT (deep venous thrombosis) (HCC) 09/2015   bilateral   Edema, lower extremity    Glaucoma    Hyperlipidemia    Hypertension    Hypoglycemia 09/09/2012   Insomnia    Insulin dependent diabetes mellitus (HCC) 04/23/2012   Ischemic cardiomyopathy    EF 30-35%, s/p ICD 4/08 by Dr Amil Amen   Joint pain    LBBB  (left bundle branch block) 09/24/2012   Morbid obesity (HCC) 11/06/2012   Obesity    Oxygen dependent 2 L   Pain in left knee    Peripheral neuropathy    Peripheral vascular disease (HCC)    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   LIMA to D2, SVG to ramus intermediate, sequential SVG to OM1-OM2, SVG to RCA with EVH via both legs    Sleep apnea    uses O2 at night   Tinea    Type 2 diabetes mellitus with hyperglycemia, with long-term current use of insulin (HCC) 04/20/2019   Uncontrolled diabetes mellitus 02/2019   Ventricular tachycardia (HCC) 01/16/2015   sustained VT terminated with ATP, CL 340 msec   Vitamin B 12 deficiency 11/05/2018   Vitamin D deficiency 11/05/2018    Medications:  Infusions:   heparin 1,000 Units/hr (04/16/23 0050)    Assessment: 73 yo F who presents with ankle fracture after fall.  She is on apixaban PTA (last dose 4/7 @ 8a) for hx VTE which is being held for possible surgery. Pharmacy consulted to dose IV heparin.  Elevated heparin level expecuted due to Eliquis - lab interaction.   -aPTT 76 - therapeutic with heparin infusing at 1000 units/hr -  Plan for ORIF 4/9 - heparin to stop at midnight in anticipation of procedure  Goal of Therapy:  Heparin level 0.3-0.7 units/ml aPTT 66-102 seconds Monitor platelets by anticoagulation protocol: Yes   Plan:  -Continue heparin infusion at 1000 units/hr -Orders placed for heparin to stop at midnight -Follow up post op anticoagulation plans   Pricilla Riffle, PharmD, BCPS Clinical Pharmacist 04/16/2023 2:35 PM

## 2023-04-16 NOTE — Progress Notes (Signed)
 Notified MD pt stated her knee is starting to hurt from her fall

## 2023-04-16 NOTE — Progress Notes (Signed)
 PROGRESS NOTE    [name]  DGL:[id number] DOB: 09/10/1950 DOA: 04/15/2023 PCP: Elie Confer, NP   Brief Narrative: 73 year old with past medical history significant for chronic combined systolic and diastolic heart failure, CAD status post CABG, diabetes type 2, peripheral artery disease, history of DVT patient presented after a fall.  She misstep while entering a van.  Evaluation in the ED showed right ankle fracture, placed on a splint.  Ortho recommended admission for surgery.  Now plan is for surgery at [facility].   Assessment & Plan:   Principal Problem:   Ankle fracture Active Problems:   CAD (coronary artery disease)   Hypertension   Chronic systolic dysfunction of left ventricle   S/P CABG x 5   DVT (deep venous thrombosis) (HCC)   Sleep apnea   Type 2 diabetes mellitus with hyperglycemia, with long-term current use of insulin (HCC)   Hypokalemia   1-Right Ankle Fracture -Presents after mechanical fall.  -Ortho recommend CT ankle prior to sx -ok to stay at [facility], plan for Sx 4/09   History of DVT -On heparin Gtt. On Eliquis at home.  -hold heparin Gtt at midnight  History of chronic combined CHF -on Entresto, farxiga, continue. Monitor renal function.   History of CAD s/p CABG -Denies chest pain.  -Continue Crestor, Zetia.   Diabetes type 2 -Continue with Semglee, SSI  Hypokalemia -Replaced.   Sleep apnea CPAP ordered.   Peripheral artery disease: patient has had an ABI done earlier today showed reading of 0.6 on the right and noncompressible on the left. Will need follow-up with vascular surgeon.         Estimated body mass index is 40.74 kg/m as calculated from the following:   Height as of 04/03/23: 5\' 3"  (1.6 m).   Weight as of 04/03/23: 104.3 kg.   DVT prophylaxis: Heparin  Code Status: Full code Family Communication: Disposition Plan:  Status is: Observation The patient remains OBS appropriate and will d/c before 2  midnights.    Consultants:  Ortho  Procedures:  none  Antimicrobials:    Subjective: She report ankle;e pain., denies chest pain or dyspnea.   Objective: Vitals:   04/16/23 0100 04/16/23 0122 04/16/23 0400 04/16/23 0500  BP: (!) 152/73   (!) 141/67  Pulse: (!) 59  62 65  Resp: 18  18 13   Temp:  98.3 F (36.8 C)  98 F (36.7 C)  TempSrc:  Oral  Oral  SpO2: 100%  98% 100%   No intake or output data in the 24 hours ending 04/16/23 0811 There were no vitals filed for this visit.  Examination:  General exam: Appears calm and comfortable  Respiratory system: Clear to auscultation. Respiratory effort normal. Cardiovascular system: S1 & S2 heard, RRR. Gastrointestinal system: Abdomen is nondistended, soft and nontender. No organomegaly or masses felt. Normal bowel sounds heard. Central nervous system: Alert and oriented. No focal neurological deficits. Extremities: Right LE with dressing.   Data Reviewed: I have personally reviewed following labs and imaging studies  CBC: Recent Labs  Lab 04/15/23 1810 04/16/23 0526  WBC 5.3 5.9  HGB 14.6 14.1  HCT 46.2* 45.6  MCV 93.7 95.8  PLT 119* 117*   Basic Metabolic Panel: Recent Labs  Lab 04/15/23 1810 04/16/23 0526  NA 144 140  K 3.1* 4.0  CL 117* 109  CO2 18* 25  GLUCOSE 124* 156*  BUN 19 21  CREATININE 0.79 0.82  CALCIUM 7.2* 9.3   GFR: Estimated  Creatinine Clearance: 70.6 mL/min (by C-G formula based on SCr of 0.82 mg/dL). Liver Function Tests: No results for input(s): "AST", "ALT", "ALKPHOS", "BILITOT", "PROT", "ALBUMIN" in the last 168 hours. No results for input(s): "LIPASE", "AMYLASE" in the last 168 hours. No results for input(s): "AMMONIA" in the last 168 hours. Coagulation Profile: No results for input(s): "INR", "PROTIME" in the last 168 hours. Cardiac Enzymes: No results for input(s): "CKTOTAL", "CKMB", "CKMBINDEX", "TROPONINI" in the last 168 hours. BNP (last 3 results) No results for  input(s): "PROBNP" in the last 8760 hours. HbA1C: No results for input(s): "HGBA1C" in the last 72 hours. CBG: No results for input(s): "GLUCAP" in the last 168 hours. Lipid Profile: No results for input(s): "CHOL", "HDL", "LDLCALC", "TRIG", "CHOLHDL", "LDLDIRECT" in the last 72 hours. Thyroid Function Tests: No results for input(s): "TSH", "T4TOTAL", "FREET4", "T3FREE", "THYROIDAB" in the last 72 hours. Anemia Panel: No results for input(s): "VITAMINB12", "FOLATE", "FERRITIN", "TIBC", "IRON", "RETICCTPCT" in the last 72 hours. Sepsis Labs: No results for input(s): "PROCALCITON", "LATICACIDVEN" in the last 168 hours.  No results found for this or any previous visit (from the past 240 hours).       Radiology Studies: DG Ankle Right Port Result Date: 04/15/2023 CLINICAL DATA:  Ankle fracture, reduction. EXAM: PORTABLE RIGHT ANKLE - 2 VIEW COMPARISON:  Radiograph earlier today FINDINGS: Improved alignment of distal tibia and fibular fractures postreduction. Improved alignment of the ankle mortise which now appears congruent. Overlying splint material in place. IMPRESSION: Improved alignment of distal tibia and fibular fractures postreduction. Improved alignment of the ankle mortise. Electronically Signed   By: Narda Rutherford M.D.   On: 04/15/2023 22:48   DG Ankle Right Port Result Date: 04/15/2023 CLINICAL DATA:  Fall today with right lower leg deformity.  Pain. EXAM: PORTABLE RIGHT ANKLE - 2 VIEW; PORTABLE RIGHT TIBIA AND FIBULA - 2 VIEW COMPARISON:  None Available. FINDINGS: Ankle: Displaced oblique fracture of the distal fibula just proximal to the ankle mortise. Oblique displaced fracture through the medial malleolus. Lateral translation of the talus and distal fracture fragments with respect to the tibial plafond. Suspected posterior tibial tubercle fracture. Generalized soft tissue edema. Vascular calcifications. Remote healed second metatarsal fracture. Tibia/fibula: Proximal tibia and  fibular intact. No fracture of the proximal lower leg. Distal soft tissue edema. Vascular calcifications are seen. IMPRESSION: 1. Displaced distal fibular and tibial fractures with lateral translation of the talus and distal fracture fragments with respect to the tibial plafond. 2. Suspected posterior tibial tubercle fracture. 3. No fracture of the proximal lower leg. Electronically Signed   By: Narda Rutherford M.D.   On: 04/15/2023 19:05   DG Tibia/Fibula Right Port Result Date: 04/15/2023 CLINICAL DATA:  Fall today with right lower leg deformity.  Pain. EXAM: PORTABLE RIGHT ANKLE - 2 VIEW; PORTABLE RIGHT TIBIA AND FIBULA - 2 VIEW COMPARISON:  None Available. FINDINGS: Ankle: Displaced oblique fracture of the distal fibula just proximal to the ankle mortise. Oblique displaced fracture through the medial malleolus. Lateral translation of the talus and distal fracture fragments with respect to the tibial plafond. Suspected posterior tibial tubercle fracture. Generalized soft tissue edema. Vascular calcifications. Remote healed second metatarsal fracture. Tibia/fibula: Proximal tibia and fibular intact. No fracture of the proximal lower leg. Distal soft tissue edema. Vascular calcifications are seen. IMPRESSION: 1. Displaced distal fibular and tibial fractures with lateral translation of the talus and distal fracture fragments with respect to the tibial plafond. 2. Suspected posterior tibial tubercle fracture. 3. No fracture of the  proximal lower leg. Electronically Signed   By: Narda Rutherford M.D.   On: 04/15/2023 19:05   VAS [name] ABI WITH/WO TBI Result Date: 04/15/2023 LOWER EXTREMITY ARTERIAL DUPLEX STUDY Patient Name:  [name]  Date of Exam:   04/15/2023 Medical Rec #: [id number]         Accession #:    [id number] Date of Birth: 08/06/1950         Patient Gender: F Patient Age:   67 years Exam Location:  [facility] Procedure:      VAS [name] LOWER EXTREMITY ARTERIAL DUPLEX Referring Phys:  EMMA COLLINS --------------------------------------------------------------------------------  Indications: Rest pain, and peripheral artery disease.  Current ABI: Right: 0.66              Left: [place] Limitations: Body habitus, Imaging hardware/software limitations Performing Technologist: [name] RVT  Examination Guidelines: A complete evaluation includes B-mode imaging, spectral Doppler, color Doppler, and power Doppler as needed of all accessible portions of each vessel. Bilateral testing is considered an integral part of a complete examination. Limited examinations for reoccurring indications may be performed as noted.  +-----------+--------+-----+---------------+----------+--------+ RIGHT      PSV cm/sRatioStenosis       Waveform  Comments +-----------+--------+-----+---------------+----------+--------+ CFA Distal 165          30-49% stenosisbiphasic           +-----------+--------+-----+---------------+----------+--------+ DFA        67                          biphasic           +-----------+--------+-----+---------------+----------+--------+ SFA Prox   168          30-49% stenosisbiphasic           +-----------+--------+-----+---------------+----------+--------+ SFA Mid    45                          biphasic           +-----------+--------+-----+---------------+----------+--------+ SFA Distal 93                          biphasic           +-----------+--------+-----+---------------+----------+--------+ POP Prox   90                          biphasic           +-----------+--------+-----+---------------+----------+--------+ POP Distal 218          50-74% stenosisbiphasic           +-----------+--------+-----+---------------+----------+--------+ TP Trunk   47                          biphasic           +-----------+--------+-----+---------------+----------+--------+ ATA Distal 84                          monophasic          +-----------+--------+-----+---------------+----------+--------+ PTA Distal 54                          monophasic         +-----------+--------+-----+---------------+----------+--------+ PERO Distal0            occluded  absent             +-----------+--------+-----+---------------+----------+--------+  +-----------+--------+-----+--------+----------+--------------+ LEFT       PSV cm/sRatioStenosisWaveform  Comments       +-----------+--------+-----+--------+----------+--------------+ CFA Distal 103                  biphasic                 +-----------+--------+-----+--------+----------+--------------+ DFA        125                  biphasic                 +-----------+--------+-----+--------+----------+--------------+ SFA Prox   131                  biphasic                 +-----------+--------+-----+--------+----------+--------------+ SFA Mid    106                  biphasic                 +-----------+--------+-----+--------+----------+--------------+ SFA Distal 89                   biphasic                 +-----------+--------+-----+--------+----------+--------------+ POP Prox   74                   biphasic                 +-----------+--------+-----+--------+----------+--------------+ POP Distal 67                   biphasic                 +-----------+--------+-----+--------+----------+--------------+ TP Trunk                                  not visualized +-----------+--------+-----+--------+----------+--------------+ ATA Distal 0            occludedabsent                   +-----------+--------+-----+--------+----------+--------------+ PTA Distal 53                   monophasic               +-----------+--------+-----+--------+----------+--------------+ PERO Distal0            occludedabsent                   +-----------+--------+-----+--------+----------+--------------+  Summary: Right: -  Diffuse atherosclerotic changes noted throughout the femoral-popliteal arteries with areas of 30-49% and 50-74% stenoses. - No flow was observed at the anterior tibial artery distally. Left: - Diffuse atherosclerotic changes noted throughout the femoral-popliteal arteries with no evidence of significant stenosis. - No flow was observed at the anterior tibial or peroneal arteries distally.  See table(s) above for measurements and observations.    Preliminary         Scheduled Meds:  amiodarone  100 mg Oral Daily   calcitonin (salmon)  1 spray Alternating Nares Daily   dapagliflozin propanediol  10 mg Oral Daily   ezetimibe  10 mg Oral Daily   ferrous sulfate  325 mg Oral Q breakfast   gabapentin  300 mg Oral TID   insulin aspart  0-9 Units Subcutaneous  TID WC   insulin glargine-yfgn  30 Units Subcutaneous Daily   latanoprost  1 drop Both Eyes QHS   rosuvastatin  20 mg Oral q1800   sacubitril-valsartan  1 tablet Oral BID   Continuous Infusions:  heparin 1,000 Units/hr (04/16/23 0050)     LOS: 0 days    Time spent: 35 Minutes    Rada Zegers A Jamisyn Langer, MD [facility]   If 7PM-7AM, please contact night-coverage [email or web address]  04/16/2023, 8:11 AM

## 2023-04-17 ENCOUNTER — Inpatient Hospital Stay (HOSPITAL_COMMUNITY)

## 2023-04-17 ENCOUNTER — Inpatient Hospital Stay (HOSPITAL_COMMUNITY): Admitting: Anesthesiology

## 2023-04-17 ENCOUNTER — Encounter (HOSPITAL_COMMUNITY)
Admission: EM | Disposition: A | Discharge status: Skilled Nursing Facility | Payer: Self-pay | Source: Home / Self Care | Attending: Internal Medicine

## 2023-04-17 ENCOUNTER — Other Ambulatory Visit: Payer: Self-pay

## 2023-04-17 ENCOUNTER — Encounter: Payer: Self-pay | Admitting: Cardiology

## 2023-04-17 DIAGNOSIS — I480 Paroxysmal atrial fibrillation: Secondary | ICD-10-CM | POA: Diagnosis present

## 2023-04-17 DIAGNOSIS — S82891A Other fracture of right lower leg, initial encounter for closed fracture: Secondary | ICD-10-CM | POA: Diagnosis not present

## 2023-04-17 DIAGNOSIS — E876 Hypokalemia: Secondary | ICD-10-CM | POA: Diagnosis present

## 2023-04-17 DIAGNOSIS — I509 Heart failure, unspecified: Secondary | ICD-10-CM | POA: Diagnosis not present

## 2023-04-17 DIAGNOSIS — I110 Hypertensive heart disease with heart failure: Secondary | ICD-10-CM | POA: Diagnosis present

## 2023-04-17 DIAGNOSIS — I2510 Atherosclerotic heart disease of native coronary artery without angina pectoris: Secondary | ICD-10-CM

## 2023-04-17 DIAGNOSIS — E785 Hyperlipidemia, unspecified: Secondary | ICD-10-CM | POA: Diagnosis present

## 2023-04-17 DIAGNOSIS — I519 Heart disease, unspecified: Secondary | ICD-10-CM

## 2023-04-17 DIAGNOSIS — Z794 Long term (current) use of insulin: Secondary | ICD-10-CM | POA: Diagnosis not present

## 2023-04-17 DIAGNOSIS — S93431A Sprain of tibiofibular ligament of right ankle, initial encounter: Secondary | ICD-10-CM | POA: Diagnosis present

## 2023-04-17 DIAGNOSIS — G4730 Sleep apnea, unspecified: Secondary | ICD-10-CM | POA: Diagnosis present

## 2023-04-17 DIAGNOSIS — S82891D Other fracture of right lower leg, subsequent encounter for closed fracture with routine healing: Secondary | ICD-10-CM | POA: Diagnosis not present

## 2023-04-17 DIAGNOSIS — I447 Left bundle-branch block, unspecified: Secondary | ICD-10-CM | POA: Diagnosis present

## 2023-04-17 DIAGNOSIS — Z6841 Body Mass Index (BMI) 40.0 and over, adult: Secondary | ICD-10-CM | POA: Diagnosis not present

## 2023-04-17 DIAGNOSIS — Z9981 Dependence on supplemental oxygen: Secondary | ICD-10-CM | POA: Diagnosis not present

## 2023-04-17 DIAGNOSIS — E1151 Type 2 diabetes mellitus with diabetic peripheral angiopathy without gangrene: Secondary | ICD-10-CM | POA: Diagnosis present

## 2023-04-17 DIAGNOSIS — Z7901 Long term (current) use of anticoagulants: Secondary | ICD-10-CM | POA: Diagnosis not present

## 2023-04-17 DIAGNOSIS — H409 Unspecified glaucoma: Secondary | ICD-10-CM | POA: Diagnosis present

## 2023-04-17 DIAGNOSIS — S82851A Displaced trimalleolar fracture of right lower leg, initial encounter for closed fracture: Secondary | ICD-10-CM | POA: Diagnosis present

## 2023-04-17 DIAGNOSIS — Z8249 Family history of ischemic heart disease and other diseases of the circulatory system: Secondary | ICD-10-CM | POA: Diagnosis not present

## 2023-04-17 DIAGNOSIS — E1165 Type 2 diabetes mellitus with hyperglycemia: Secondary | ICD-10-CM | POA: Diagnosis present

## 2023-04-17 DIAGNOSIS — I82402 Acute embolism and thrombosis of unspecified deep veins of left lower extremity: Secondary | ICD-10-CM | POA: Diagnosis not present

## 2023-04-17 DIAGNOSIS — I5042 Chronic combined systolic (congestive) and diastolic (congestive) heart failure: Secondary | ICD-10-CM | POA: Diagnosis present

## 2023-04-17 DIAGNOSIS — N179 Acute kidney failure, unspecified: Secondary | ICD-10-CM | POA: Diagnosis present

## 2023-04-17 DIAGNOSIS — Z86718 Personal history of other venous thrombosis and embolism: Secondary | ICD-10-CM | POA: Diagnosis not present

## 2023-04-17 DIAGNOSIS — S82899A Other fracture of unspecified lower leg, initial encounter for closed fracture: Secondary | ICD-10-CM | POA: Diagnosis present

## 2023-04-17 DIAGNOSIS — W1830XA Fall on same level, unspecified, initial encounter: Secondary | ICD-10-CM | POA: Diagnosis present

## 2023-04-17 DIAGNOSIS — Z951 Presence of aortocoronary bypass graft: Secondary | ICD-10-CM | POA: Diagnosis not present

## 2023-04-17 DIAGNOSIS — I255 Ischemic cardiomyopathy: Secondary | ICD-10-CM | POA: Diagnosis present

## 2023-04-17 DIAGNOSIS — E1142 Type 2 diabetes mellitus with diabetic polyneuropathy: Secondary | ICD-10-CM | POA: Diagnosis present

## 2023-04-17 HISTORY — PX: ORIF ANKLE FRACTURE: SHX5408

## 2023-04-17 LAB — GLUCOSE, CAPILLARY
Glucose-Capillary: 144 mg/dL — ABNORMAL HIGH (ref 70–99)
Glucose-Capillary: 103 mg/dL — ABNORMAL HIGH (ref 70–99)
Glucose-Capillary: 94 mg/dL (ref 70–99)
Glucose-Capillary: 88 mg/dL (ref 70–99)
Glucose-Capillary: 179 mg/dL — ABNORMAL HIGH (ref 70–99)

## 2023-04-17 LAB — BASIC METABOLIC PANEL WITH GFR
Anion gap: 7 (ref 5–15)
BUN: 18 mg/dL (ref 8–23)
CO2: 24 mmol/L (ref 22–32)
Calcium: 8.8 mg/dL — ABNORMAL LOW (ref 8.9–10.3)
Chloride: 103 mmol/L (ref 98–111)
Creatinine, Ser: 1.14 mg/dL — ABNORMAL HIGH (ref 0.44–1.00)
GFR, Estimated: 51 mL/min — ABNORMAL LOW (ref >60)
Glucose, Bld: 137 mg/dL — ABNORMAL HIGH (ref 70–99)
Potassium: 4.7 mmol/L (ref 3.5–5.1)
Sodium: 134 mmol/L — ABNORMAL LOW (ref 135–145)

## 2023-04-17 LAB — CBC
HCT: 42.6 % (ref 36.0–46.0)
Hemoglobin: 13 g/dL (ref 12.0–15.0)
MCH: 29.5 pg (ref 26.0–34.0)
MCHC: 30.5 g/dL (ref 30.0–36.0)
MCV: 96.8 fL (ref 80.0–100.0)
Platelets: 113 10*3/uL — ABNORMAL LOW (ref 150–400)
RBC: 4.4 MIL/uL (ref 3.87–5.11)
RDW: 15.2 % (ref 11.5–15.5)
WBC: 6 10*3/uL (ref 4.0–10.5)
nRBC: 0 % (ref 0.0–0.2)

## 2023-04-17 SURGERY — OPEN REDUCTION INTERNAL FIXATION (ORIF) ANKLE FRACTURE
Anesthesia: General | Site: Ankle | Laterality: Right

## 2023-04-17 MED ORDER — BUPIVACAINE-EPINEPHRINE (PF) 0.5% -1:200000 IJ SOLN
INTRAMUSCULAR | Status: DC | PRN
  Administered 2023-04-17: 20 mL via PERINEURAL
  Administered 2023-04-17: 10 mL via PERINEURAL

## 2023-04-17 MED ORDER — VANCOMYCIN HCL 1000 MG IV SOLR
INTRAVENOUS | Status: AC
  Filled 2023-04-17: qty 20

## 2023-04-17 MED ORDER — ONDANSETRON HCL 4 MG/2ML IJ SOLN
INTRAMUSCULAR | Status: DC | PRN
  Administered 2023-04-17: 4 mg via INTRAVENOUS

## 2023-04-17 MED ORDER — LIDOCAINE HCL (CARDIAC) PF 100 MG/5ML IV SOSY
PREFILLED_SYRINGE | INTRAVENOUS | Status: DC | PRN
  Administered 2023-04-17: 100 mg via INTRAVENOUS

## 2023-04-17 MED ORDER — ETOMIDATE 2 MG/ML IV SOLN
INTRAVENOUS | Status: AC
Start: 2023-04-17 — End: ?
  Filled 2023-04-17: qty 10

## 2023-04-17 MED ORDER — CEFAZOLIN SODIUM-DEXTROSE 2-4 GM/100ML-% IV SOLN
2.0000 g | INTRAVENOUS | Status: AC
  Administered 2023-04-17: 2 g via INTRAVENOUS
  Filled 2023-04-17: qty 100

## 2023-04-17 MED ORDER — ONDANSETRON HCL 4 MG/2ML IJ SOLN
INTRAMUSCULAR | Status: AC
Start: 2023-04-17 — End: ?
  Filled 2023-04-17: qty 2

## 2023-04-17 MED ORDER — VASOPRESSIN 20 UNIT/ML IV SOLN
INTRAVENOUS | Status: AC
  Filled 2023-04-17: qty 1

## 2023-04-17 MED ORDER — CEFAZOLIN SODIUM-DEXTROSE 2-4 GM/100ML-% IV SOLN
2.0000 g | Freq: Three times a day (TID) | INTRAVENOUS | Status: AC
  Administered 2023-04-17 – 2023-04-18 (×2): 2 g via INTRAVENOUS
  Filled 2023-04-17 (×2): qty 100

## 2023-04-17 MED ORDER — VANCOMYCIN HCL 1000 MG IV SOLR
INTRAVENOUS | Status: DC | PRN
  Administered 2023-04-17: 1000 mg

## 2023-04-17 MED ORDER — LACTATED RINGERS IV SOLN
INTRAVENOUS | Status: DC
Start: 2023-04-17 — End: 2023-04-17

## 2023-04-17 MED ORDER — FENTANYL CITRATE PF 50 MCG/ML IJ SOSY
50.0000 ug | PREFILLED_SYRINGE | INTRAMUSCULAR | Status: DC
  Administered 2023-04-17: 50 ug via INTRAVENOUS
  Filled 2023-04-17: qty 2

## 2023-04-17 MED ORDER — 0.9 % SODIUM CHLORIDE (POUR BTL) OPTIME
TOPICAL | Status: DC | PRN
  Administered 2023-04-17: 1000 mL

## 2023-04-17 MED ORDER — OXYCODONE HCL 5 MG PO TABS: 5.0000 mg | ORAL_TABLET | Freq: Once | ORAL | Status: DC | PRN

## 2023-04-17 MED ORDER — ROCURONIUM BROMIDE 100 MG/10ML IV SOLN
INTRAVENOUS | Status: DC | PRN
  Administered 2023-04-17: 60 mg via INTRAVENOUS

## 2023-04-17 MED ORDER — OXYCODONE HCL 5 MG/5ML PO SOLN: 5.0000 mg | Freq: Once | ORAL | Status: DC | PRN

## 2023-04-17 MED ORDER — EPHEDRINE SULFATE (PRESSORS) 50 MG/ML IJ SOLN
INTRAMUSCULAR | Status: DC | PRN
  Administered 2023-04-17: 10 mg via INTRAVENOUS

## 2023-04-17 MED ORDER — LIDOCAINE HCL (PF) 2 % IJ SOLN
INTRAMUSCULAR | Status: AC
  Filled 2023-04-17: qty 5

## 2023-04-17 MED ORDER — PHENYLEPHRINE HCL (PRESSORS) 10 MG/ML IV SOLN
INTRAVENOUS | Status: DC | PRN
  Administered 2023-04-17: 40 ug via INTRAVENOUS
  Administered 2023-04-17: 160 ug via INTRAVENOUS

## 2023-04-17 MED ORDER — PHENYLEPHRINE HCL-NACL 20-0.9 MG/250ML-% IV SOLN
INTRAVENOUS | Status: DC | PRN
Start: 2023-04-17 — End: 2023-04-17
  Administered 2023-04-17: 15 ug/min via INTRAVENOUS

## 2023-04-17 MED ORDER — CLONIDINE HCL (ANALGESIA) 100 MCG/ML EP SOLN
EPIDURAL | Status: DC | PRN
  Administered 2023-04-17: 67 ug
  Administered 2023-04-17: 33 ug

## 2023-04-17 MED ORDER — FENTANYL CITRATE PF 50 MCG/ML IJ SOSY: 25.0000 ug | PREFILLED_SYRINGE | INTRAMUSCULAR | Status: DC | PRN

## 2023-04-17 MED ORDER — SUCCINYLCHOLINE CHLORIDE 200 MG/10ML IV SOSY
PREFILLED_SYRINGE | INTRAVENOUS | Status: AC
  Filled 2023-04-17: qty 10

## 2023-04-17 MED ORDER — ROCURONIUM BROMIDE 10 MG/ML (PF) SYRINGE
PREFILLED_SYRINGE | INTRAVENOUS | Status: AC
  Filled 2023-04-17: qty 10

## 2023-04-17 MED ORDER — FENTANYL CITRATE (PF) 100 MCG/2ML IJ SOLN
INTRAMUSCULAR | Status: AC
  Filled 2023-04-17: qty 2

## 2023-04-17 MED ORDER — FENTANYL CITRATE (PF) 100 MCG/2ML IJ SOLN
INTRAMUSCULAR | Status: DC | PRN
  Administered 2023-04-17 (×2): 25 ug via INTRAVENOUS

## 2023-04-17 MED ORDER — STERILE WATER FOR IRRIGATION IR SOLN
Status: DC | PRN
Start: 2023-04-17 — End: 2023-04-17
  Administered 2023-04-17: 1000 mL

## 2023-04-17 MED ORDER — SUGAMMADEX SODIUM 200 MG/2ML IV SOLN
INTRAVENOUS | Status: DC | PRN
  Administered 2023-04-17: 400 mg via INTRAVENOUS

## 2023-04-17 MED ORDER — CHLORHEXIDINE GLUCONATE 0.12 % MT SOLN
15.0000 mL | Freq: Once | OROMUCOSAL | Status: AC
  Administered 2023-04-17: 15 mL via OROMUCOSAL

## 2023-04-17 MED ORDER — PROPOFOL 10 MG/ML IV BOLUS
INTRAVENOUS | Status: AC
  Filled 2023-04-17: qty 20

## 2023-04-17 MED ORDER — PROPOFOL 10 MG/ML IV BOLUS
INTRAVENOUS | Status: DC | PRN
  Administered 2023-04-17: 100 mg via INTRAVENOUS
  Administered 2023-04-17: 20 mg via INTRAVENOUS
  Administered 2023-04-17 (×2): 30 mg via INTRAVENOUS

## 2023-04-17 SURGICAL SUPPLY — 57 items
BAG COUNTER SPONGE SURGICOUNT (BAG) IMPLANT
BAG ZIPLOCK 12X15 (MISCELLANEOUS) ×1 IMPLANT
BANDAGE ESMARK 6X9 LF (GAUZE/BANDAGES/DRESSINGS) ×1 IMPLANT
BIT DRILL 2.5X2.75 QC CALB (BIT) IMPLANT
BIT DRILL 2.9 CANN QC NONSTRL (BIT) IMPLANT
BIT DRILL 2.9X70 QC CALB (BIT) IMPLANT
BLADE SURG 15 STRL LF DISP TIS (BLADE) ×2 IMPLANT
BNDG COHESIVE 6X5 TAN ST LF (GAUZE/BANDAGES/DRESSINGS) ×1 IMPLANT
BNDG ELASTIC 4INX 5YD STR LF (GAUZE/BANDAGES/DRESSINGS) ×1 IMPLANT
BNDG ELASTIC 6INX 5YD STR LF (GAUZE/BANDAGES/DRESSINGS) ×1 IMPLANT
BNDG ELASTIC 6X15 VLCR STRL LF (GAUZE/BANDAGES/DRESSINGS) IMPLANT
BNDG ESMARK 6X9 LF (GAUZE/BANDAGES/DRESSINGS) ×1 IMPLANT
BNDG GAUZE DERMACEA FLUFF 4 (GAUZE/BANDAGES/DRESSINGS) ×1 IMPLANT
CHLORAPREP W/TINT 26 (MISCELLANEOUS) ×2 IMPLANT
COVER SURGICAL LIGHT HANDLE (MISCELLANEOUS) ×1 IMPLANT
CUFF TRNQT CYL 34X4.125X (TOURNIQUET CUFF) ×1 IMPLANT
DRAPE C-ARM 42X120 X-RAY (DRAPES) IMPLANT
DRAPE C-ARMOR (DRAPES) ×1 IMPLANT
DRAPE EXTREMITY T 121X128X90 (DISPOSABLE) ×1 IMPLANT
DRAPE U-SHAPE 47X51 STRL (DRAPES) ×1 IMPLANT
ELECT REM PT RETURN 15FT ADLT (MISCELLANEOUS) ×1 IMPLANT
GAUZE 4X4 16PLY ~~LOC~~+RFID DBL (SPONGE) ×1 IMPLANT
GAUZE PAD ABD 8X10 STRL (GAUZE/BANDAGES/DRESSINGS) ×5 IMPLANT
GAUZE SPONGE 4X4 12PLY STRL (GAUZE/BANDAGES/DRESSINGS) ×2 IMPLANT
GAUZE SPONGE 4X4 12PLY STRL LF (GAUZE/BANDAGES/DRESSINGS) IMPLANT
GAUZE XEROFORM 1X8 LF (GAUZE/BANDAGES/DRESSINGS) ×1 IMPLANT
GLOVE BIOGEL PI IND STRL 7.5 (GLOVE) ×2 IMPLANT
GLOVE INDICATOR 7.5 STRL GRN (GLOVE) ×1 IMPLANT
GOWN STRL REUS W/ TWL LRG LVL3 (GOWN DISPOSABLE) ×1 IMPLANT
K-WIRE ACE 1.6X6 (WIRE) ×6 IMPLANT
KIT BASIN OR (CUSTOM PROCEDURE TRAY) ×1 IMPLANT
KIT TURNOVER KIT A (KITS) IMPLANT
KWIRE ACE 1.6X6 (WIRE) IMPLANT
NS IRRIG 1000ML POUR BTL (IV SOLUTION) ×1 IMPLANT
PACK TOTAL JOINT (CUSTOM PROCEDURE TRAY) ×1 IMPLANT
PAD CAST 4YDX4 CTTN HI CHSV (CAST SUPPLIES) IMPLANT
PADDING CAST SYNTHETIC 6X4 NS (CAST SUPPLIES) IMPLANT
PLATE LOCK 7H 92 BILAT FIB (Plate) IMPLANT
PROTECTOR NERVE ULNAR (MISCELLANEOUS) ×1 IMPLANT
SCREW ACE CAN 4.0 22M (Screw) IMPLANT
SCREW ACE CAN 4.0 44M (Screw) IMPLANT
SCREW ACE CAN 4.0 46M (Screw) IMPLANT
SCREW LOCK 3.5X10 DIST TIB (Screw) IMPLANT
SCREW LOCK 3.5X12 DIST TIB (Screw) IMPLANT
SCREW LOCK CORT STAR 3.5X12 (Screw) IMPLANT
SCREW LOW PROFILE 12MMX3.5MM (Screw) IMPLANT
SCREW NLOCK CANC HEX 4X55 (Screw) IMPLANT
SCREW NON LOCKING LP 3.5 14MM (Screw) IMPLANT
SPLINT FIBERGLASS 4X30 (CAST SUPPLIES) IMPLANT
STOCKINETTE 4X48 STRL (DRAPES) ×1 IMPLANT
SUCTION TUBE FRAZIER 10FR DISP (SUCTIONS) ×1 IMPLANT
SUT ETHILON 3 0 PS 1 (SUTURE) ×2 IMPLANT
SUT VIC AB 2-0 CT1 TAPERPNT 27 (SUTURE) IMPLANT
SUT VIC AB 2-0 SH 27XBRD (SUTURE) ×1 IMPLANT
SUT VIC AB 3-0 SH 27X BRD (SUTURE) ×2 IMPLANT
WASHER PLAIN FLAT ACE NS 3PK (Orthopedic Implant) IMPLANT
WATER STERILE IRR 1000ML POUR (IV SOLUTION) ×1 IMPLANT

## 2023-04-17 NOTE — Progress Notes (Signed)
 PERIOPERATIVE PRESCRIPTION FOR IMPLANTED CARDIAC DEVICE PROGRAMMING  Patient Information: Name:  [name]  DOB:  06/04/1950  MRN:  [id number]    Planned Procedure:  OPEN REDUCTION INTERNAL FIXATION (ORIF) ANKLE FRACTURE - Right  Surgeon:  Dr. [name]  Date of Procedure:  TODAY 04/17/23  Cautery will be used.  Position during surgery:  Supine   Please send documentation back to:  [facility] [phone] # [phone])  Device Information:  Clinic EP Physician:  Dr. Steffanie Dunn   Device Type:  Defibrillator Manufacturer and Phone #:  [employer]: [phone] Pacemaker Dependent?:  No. Date of Last Device Check:  04/03/2023 Normal Device Function?:  Yes.    Electrophysiologist's Recommendations:  Have magnet available. Provide continuous ECG monitoring when magnet is used or reprogramming is to be performed.  Procedure should not interfere with device function.  No device programming or magnet placement needed.  Per Device Clinic Standing Orders, Lenor Coffin, RN  10:41 AM 04/17/2023

## 2023-04-17 NOTE — Progress Notes (Signed)
 Requested device orders from pre-op device clinic

## 2023-04-17 NOTE — Progress Notes (Signed)
   04/17/23 2104  BiPAP/CPAP/SIPAP  Reason BIPAP/CPAP not in use Non-compliant   Pt does not wish to wear cpap while here in the hospital.  Pt and family confirm use of oxygen at home instead.

## 2023-04-17 NOTE — Care Management Obs Status (Signed)
 MEDICARE OBSERVATION STATUS NOTIFICATION   Patient Details  Name: [name] MRN: [id number] Date of Birth: 1950-10-19   Medicare Observation Status Notification Given:  Yes    Howell Rucks, RN 04/17/2023, 9:37 AM

## 2023-04-17 NOTE — Plan of Care (Signed)
  Problem: Education: Goal: Ability to describe self-care measures that may prevent or decrease complications (Diabetes Survival Skills Education) will improve Outcome: Progressing   Problem: Activity: Goal: Risk for activity intolerance will decrease Outcome: Progressing   Problem: Pain Managment: Goal: General experience of comfort will improve and/or be controlled Outcome: Progressing

## 2023-04-17 NOTE — Anesthesia Procedure Notes (Addendum)
 Procedure Name: Intubation Date/Time: 04/17/2023 3:35 PM  Performed by: Floydene Flock, CRNAPre-anesthesia Checklist: Patient identified, Emergency Drugs available, Suction available and Patient being monitored Patient Re-evaluated:Patient Re-evaluated prior to induction Oxygen Delivery Method: Circle system utilized Preoxygenation: Pre-oxygenation with 100% oxygen Induction Type: IV induction Ventilation: Oral airway inserted - appropriate to patient size Laryngoscope Size: Mac and 3 Grade View: Grade I Tube type: Oral Tube size: 7.5 mm Airway Equipment and Method: Stylet Placement Confirmation: ETT inserted through vocal cords under direct vision, positive ETCO2 and breath sounds checked- equal and bilateral Secured at: 22 cm Tube secured with: Tape Dental Injury: Teeth and Oropharynx as per pre-operative assessment  Comments: Induction per Anesthesiologist, all meds until 1542 per MD.

## 2023-04-17 NOTE — Anesthesia Preprocedure Evaluation (Addendum)
 Anesthesia Evaluation  Patient identified by MRN, date of birth, ID band Patient awake    Reviewed: Allergy & Precautions, NPO status , Patient's Chart, lab work & pertinent test results  History of Anesthesia Complications Negative for: history of anesthetic complications  Airway Mallampati: II  TM Distance: >3 FB Neck ROM: Full    Dental  (+) Edentulous Lower, Edentulous Upper   Pulmonary sleep apnea, Continuous Positive Airway Pressure Ventilation and Oxygen sleep apnea    Pulmonary exam normal        Cardiovascular hypertension, + CAD, + CABG (2007), + Peripheral Vascular Disease and +CHF  Normal cardiovascular exam+ pacemaker + Cardiac Defibrillator   TTE 07/2022: EF 25-30%, global hypokinesis with septal-lateral dyssynchrony  consistent with LBBB, mild LVE, moderate LVH, grade I DD, mild RV dysfunction, mild LAE, valves ok    LBBB   Neuro/Psych   Anxiety Depression       GI/Hepatic negative GI ROS, Neg liver ROS,,,  Endo/Other  diabetes, Type 2, Insulin Dependent  Class 3 obesity  Renal/GU Renal Insufficiency and ARFRenal disease     Musculoskeletal  (+) Arthritis ,    Abdominal   Peds  Hematology negative hematology ROS (+)   Anesthesia Other Findings Day of surgery medications reviewed with patient.  Reproductive/Obstetrics                              Anesthesia Physical Anesthesia Plan  ASA: 4  Anesthesia Plan: General   Post-op Pain Management: Tylenol PO (pre-op)* and Regional block*   Induction: Intravenous  PONV Risk Score and Plan: 3 and Treatment may vary due to age or medical condition, Ondansetron, Dexamethasone and Midazolam  Airway Management Planned: Oral ETT  Additional Equipment: ClearSight  Intra-op Plan:   Post-operative Plan: Extubation in OR  Informed Consent: I have reviewed the patients History and Physical, chart, labs and discussed the  procedure including the risks, benefits and alternatives for the proposed anesthesia with the patient or authorized representative who has indicated his/her understanding and acceptance.     Dental advisory given  Plan Discussed with: CRNA  Anesthesia Plan Comments:         Anesthesia Quick Evaluation

## 2023-04-17 NOTE — TOC Initial Note (Signed)
 Transition of Care [facility]) - Initial/Assessment Note    Patient Details  Name: [name] MRN: [id number] Date of Birth: 08/30/1950  [facility]) CM/SW Contact:    Howell Rucks, RN Phone Number: 04/17/2023, 9:54 AM  Clinical Narrative:    Met with patient and her dtr , [name], at bedside to introduce role of TOC/NCM and review for dc planning, pt confirmed she has an established Pcp and pharmacy, no current skilled home services, does have PCS through [facility], patient reports she feels safe returning home with support from family. MOON explained to patient and daughter, MOON signed by patient, copy provided. TOC will continue to follow.    Expected Discharge Plan: Home w Home Health Services Barriers to Discharge: Continued Medical Work up   Patient Goals and CMS Choice Patient states their goals for this hospitalization and ongoing recovery are:: return home          Expected Discharge Plan and Services       Living arrangements for the past 2 months: Single Family Home                                      Prior Living Arrangements/Services Living arrangements for the past 2 months: Single Family Home Lives with:: Adult Children Patient language and need for interpreter reviewed:: Yes Do you feel safe going back to the place where you live?: Yes      Need for Family Participation in Patient Care: Yes (Comment) Care giver support system in place?: Yes (comment) Current home services: DME (RW, nebulizer) Criminal Activity/Legal Involvement Pertinent to Current Situation/Hospitalization: No - Comment as needed  Activities of Daily Living   ADL Screening (condition at time of admission) Independently performs ADLs?: No Does the patient have a NEW difficulty with bathing/dressing/toileting/self-feeding that is expected to last >3 days?: Yes (Initiates electronic notice to provider for possible OT consult) Does the patient have a NEW  difficulty with getting in/out of bed, walking, or climbing stairs that is expected to last >3 days?: Yes (Initiates electronic notice to provider for possible PT consult) Does the patient have a NEW difficulty with communication that is expected to last >3 days?: No Is the patient deaf or have difficulty hearing?: No Does the patient have difficulty seeing, even when wearing glasses/contacts?: No Does the patient have difficulty concentrating, remembering, or making decisions?: No  Permission Sought/Granted                  Emotional Assessment Appearance:: Appears stated age Attitude/Demeanor/Rapport: Gracious Affect (typically observed): Accepting Orientation: : Oriented to Self, Oriented to Place, Oriented to  Time, Oriented to Situation Alcohol / Substance Use: Not Applicable Psych Involvement: No (comment)  Admission diagnosis:  Ankle fracture [S82.899A] Closed trimalleolar fracture of right ankle, initial encounter [S82.851A] Patient Active Problem List   Diagnosis Date Noted   Hypokalemia 04/16/2023   Ankle fracture 04/15/2023   Pacemaker 12/28/2022   Depressed left ventricular ejection fraction 04/30/2022   Acute cystitis 04/27/2022   Anxiety and depression 04/27/2022   Ventricular tachyarrhythmia (HCC) 01/12/2022   Pain in left foot 11/15/2021   Acute metabolic acidosis 10/31/2021   Vaginal bleeding, abnormal 10/30/2021   Fibroid uterus 10/30/2021   Chronic hypoxemic respiratory failure (HCC) 10/30/2021   Acute kidney injury (HCC) 10/29/2021   Nausea, vomiting, and diarrhea 08/16/2021   COVID-19 virus infection 08/16/2021   History  of DVT (deep vein thrombosis) 08/16/2021   QT prolongation 08/16/2021   Right ovarian cyst 08/16/2021   Elevated AST (SGOT) 08/16/2021   Preop cardiovascular exam 02/10/2021   Adnexal mass 12/23/2020   Polyneuropathy associated with underlying disease (HCC) 09/01/2020   Acute pansinusitis 03/18/2020   Diabetic peripheral  neuropathy (HCC) 03/18/2020   Urine finding 03/18/2020   Aortic atherosclerosis (HCC) 01/13/2020   Diabetes mellitus with coincident hypertension (HCC) 01/13/2020   Type 2 diabetes mellitus with diabetic polyneuropathy, with long-term current use of insulin (HCC) 07/16/2019   Type 2 diabetes mellitus with hyperglycemia, with long-term current use of insulin (HCC) 04/20/2019   Diabetes mellitus (HCC) 04/20/2019   Uncontrolled diabetes mellitus 02/20/2019   Pressure injury of skin 02/20/2019   Demand ischemia (HCC)    Abnormal LFTs    Acute pancreatitis 01/12/2019   DKA (diabetic ketoacidosis) (HCC) 01/12/2019   Vitamin D deficiency 11/05/2018   Vitamin B 12 deficiency 11/05/2018   Osteoarthritis of knee 07/02/2018   Tinea    Sleep apnea    Oxygen dependent    Obesity    Insomnia    Hyperlipidemia    Dermal mycosis    Depression    Coronary artery disease    Arthritis    Anxiety    ICD (implantable cardioverter-defibrillator) in place    Heme positive stool    Benign neoplasm of transverse colon    Bundle branch block 05/08/2016   Left leg pain    DVT (deep venous thrombosis) (HCC) 09/30/2015   Syncope 03/11/2015   AKI (acute kidney injury) (HCC) 03/11/2015   Hypotension 03/11/2015   Syncope and collapse 03/11/2015   Ventricular tachycardia (HCC) 01/17/2015   Pain in the chest    SOB (shortness of breath)    Thyroid nodule    CAP (community acquired pneumonia)    Chest pain 05/26/2014   Right flank pain 05/26/2014   Right flank discomfort    Chronic combined systolic and diastolic heart failure (HCC)    Angina pectoris (HCC)    Morbid obesity (HCC) 11/06/2012   Angina decubitus (HCC) 11/06/2012   LBBB (left bundle branch block) 09/24/2012   Chronic systolic dysfunction of left ventricle 09/24/2012   Hypoglycemia 09/09/2012   HLD (hyperlipidemia) 04/23/2012   Insulin dependent diabetes mellitus (HCC) 04/23/2012   Acute kidney injury superimposed on chronic kidney  disease (HCC) 03/10/2012   Ischemic cardiomyopathy 06/18/2010   CAD (coronary artery disease) 06/18/2010   Hypertension 06/18/2010   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   PCP:  Elie Confer, NP Pharmacy:   [id number] - [place] ([place]), [place] - [address] [place] ([place]) [place] [address] Phone: [phone] Fax: [phone]  [facility] - [place], [place] - [address] [place] [address] Phone: [phone] Fax: [phone]     Social Drivers of Health (SDOH) Social History: SDOH Screenings   Food Insecurity: No Food Insecurity (04/17/2023)  Housing: Low Risk  (04/17/2023)  Transportation Needs: No Transportation Needs (04/17/2023)  Utilities: Not At Risk (04/17/2023)  Alcohol Screen: Low Risk  (01/15/2022)  Depression (PHQ2-9): Low Risk  (06/19/2021)  Financial Resource Strain: Low Risk  (01/15/2022)  Social Connections: Unknown (05/21/2021)   [facility] from [facility]  Tobacco Use: Low Risk  (04/15/2023)   SDOH Interventions:     Readmission Risk Interventions     No data to display

## 2023-04-17 NOTE — Transfer of Care (Signed)
 Immediate Anesthesia Transfer of Care Note  Patient: Roslind Michaux  Procedure(s) Performed: OPEN REDUCTION INTERNAL FIXATION (ORIF) ANKLE FRACTURE (Right: Ankle)  Patient Location: PACU  Anesthesia Type:General and Regional  Level of Consciousness: drowsy and patient cooperative  Airway & Oxygen Therapy: Patient Spontanous Breathing and Patient connected to nasal cannula oxygen  Post-op Assessment: Report given to RN and Post -op Vital signs reviewed and stable  Post vital signs: Reviewed and stable  Last Vitals:  Vitals Value Taken Time  BP 144/62 04/17/23 1745  Temp    Pulse 68 04/17/23 1748  Resp 13 04/17/23 1748  SpO2 92 % 04/17/23 1748  Vitals shown include unfiled device data.  Last Pain:  Vitals:   04/17/23 1430  TempSrc: Oral  PainSc:       Patients Stated Pain Goal: 2 (04/17/23 1205)  Complications: No notable events documented.

## 2023-04-17 NOTE — Anesthesia Procedure Notes (Signed)
 Anesthesia Regional Block: Adductor canal block   Pre-Anesthetic Checklist: , timeout performed,  Correct Patient, Correct Site, Correct Laterality,  Correct Procedure, Correct Position, site marked,  Risks and benefits discussed,  Pre-op evaluation,  At surgeon's request and post-op pain management  Laterality: Right  Prep: Maximum Sterile Barrier Precautions used, chloraprep       Needles:  Injection technique: Single-shot  Needle Type: Echogenic Stimulator Needle     Needle Length: 9cm  Needle Gauge: 22     Additional Needles:   Procedures:,,,, ultrasound used (permanent image in chart),,    Narrative:  Start time: 04/17/2023 2:54 PM End time: 04/17/2023 2:57 PM Injection made incrementally with aspirations every 5 mL.  Performed by: Personally  Anesthesiologist: Kaylyn Layer, MD  Additional Notes: Risks, benefits, and alternative discussed. Patient gave consent for procedure. Patient prepped and draped in sterile fashion. Sedation administered, patient remains easily responsive to voice. Relevant anatomy identified with ultrasound guidance. Local anesthetic given in 5cc increments with no signs or symptoms of intravascular injection. No pain or paraesthesias with injection. Patient monitored throughout procedure with signs of LAST or immediate complications. Tolerated well. Ultrasound image placed in chart.  Amalia Greenhouse, MD

## 2023-04-17 NOTE — H&P (Signed)

## 2023-04-17 NOTE — Plan of Care (Signed)
  Problem: Coping: Goal: Ability to adjust to condition or change in health will improve Outcome: Progressing   Problem: Pain Managment: Goal: General experience of comfort will improve and/or be controlled Outcome: Progressing   Problem: Safety: Goal: Ability to remain free from injury will improve Outcome: Progressing

## 2023-04-17 NOTE — Progress Notes (Signed)
 PROGRESS NOTE  [name] ZOX:[id number] DOB: 1950/10/14 DOA: 04/15/2023 PCP: Elie Confer, NP   LOS: 0 days   Brief Narrative / Interim history: 73 year old female with chronic combined systolic CHF, defibrillator in place, CAD status post CABG, DM2, PAD, history of DVT comes in with a mechanical fall while entering her truck.  She was found to have a right ankle fracture, orthopedic surgery was consulted and she was admitted to the hospital.  Subjective / 24h Interval events: She is doing well this morning, denies any chest pain, denies any shortness of breath.  Reports feeling at her baseline state of health prior to the fracture, no recent chest discomfort, shortness of breath, or significant fluid overload  Assesement and Plan: Principal Problem:   Ankle fracture Active Problems:   CAD (coronary artery disease)   Hypertension   Chronic systolic dysfunction of left ventricle   S/P CABG x 5   DVT (deep venous thrombosis) (HCC)   Sleep apnea   Type 2 diabetes mellitus with hyperglycemia, with long-term current use of insulin (HCC)   Hypokalemia  Principal problem Right ankle fracture-following mechanical fall, orthopedic surgery consulted, plan for surgery today.  PT consult postoperatively, DVT prophylaxis with Eliquis as she is chronically anticoagulated  Active problems Chronic combined CHF, implantable cardioverter defibrillator in place, history of VT -continue to monitor on telemetry.  She has slight lower extremity edema but appears chronic.  Follows with cardiology as an outpatient -Most recent 2D echo done in July 2024 shows LVEF 2530%, global hypokinesis  -At baseline cardiac status prior to the fall -Hold Entresto perioperatively, resume tomorrow if blood pressure stable  PAF-continue amiodarone, hold anticoagulation until okay with orthopedics  Hyperlipidemia-continue home medications  Hypokalemia-replace and continue to monitor  History of CAD status  post CABG-no chest pain, continue home medications  History of DVT-hold anticoagulation perioperatively  PAD-outpatient follow-up with vascular  Insulin-dependent diabetes mellitus, with hyperglycemia-continue insulin  Lab Results  Component Value Date   HGBA1C 7.0 (A) 11/08/2022   CBG (last 3)  Recent Labs    04/16/23 1618 04/16/23 2122 04/17/23 0724  GLUCAP 181* 193* 144*    Scheduled Meds:  amiodarone  100 mg Oral Daily   calcitonin (salmon)  1 spray Alternating Nares Daily   dapagliflozin propanediol  10 mg Oral Daily   ezetimibe  10 mg Oral Daily   ferrous sulfate  325 mg Oral Q breakfast   gabapentin  300 mg Oral TID   insulin aspart  0-9 Units Subcutaneous TID WC   insulin glargine-yfgn  30 Units Subcutaneous Daily   latanoprost  1 drop Both Eyes QHS   rosuvastatin  20 mg Oral q1800   sacubitril-valsartan  1 tablet Oral BID   Continuous Infusions: PRN Meds:.nitroGLYCERIN, oxyCODONE  Current Outpatient Medications  Medication Instructions   albuterol (VENTOLIN HFA) 108 (90 Base) MCG/ACT inhaler 2 puffs, 2 times daily PRN   Alcohol Swabs (DROPSAFE ALCOHOL PREP) 70 % PADS Apply topically.   amiodarone (PACERONE) 100 mg, Oral, Daily   calcitonin, salmon, (MIACALCIN/FORTICAL) 200 UNIT/ACT nasal spray 1 spray, Alternating Nares, Daily   Continuous Glucose Sensor (DEXCOM G7 SENSOR) MISC 1 Device, Does not apply, As directed   cyanocobalamin (VITAMIN B12) 1,000 mcg, Intramuscular, Every 30 days   Eliquis 5 mg, Oral, 2 times daily   ENTRESTO 24-26 MG 1 tablet, Oral, 2 times daily   ezetimibe (ZETIA) 10 mg, Oral, Daily   Farxiga 10 mg, Oral, Daily   ferrous sulfate 325 mg,  Oral, Daily with breakfast   gabapentin (NEURONTIN) 300 mg, Oral, 3 times daily   glucose blood (ACCU-CHEK GUIDE) test strip Check 3 times daily   Insulin Pen Needle 31G X 5 MM MISC 1 Device, Does not apply, 4 times daily   ketoconazole (NIZORAL) 2 % cream 1 Application, Daily PRN   latanoprost  (XALATAN) 0.005 % ophthalmic solution 1 drop, Daily at bedtime   nitroGLYCERIN (NITROSTAT) 0.4 mg, Sublingual, Every 5 min PRN   NOVOLOG FLEXPEN 100 UNIT/ML FlexPen INJECT PER SLIDING SCALE AS DIRECTED , MAX 30 UNITS PER DAY. DISCARD PEN 28 DAYS AFTER OPENING   OXYGEN 2 L, Inhalation, Continuous   rosuvastatin (CRESTOR) 20 mg, Oral, Daily-1800   Tresiba FlexTouch 30 Units, Subcutaneous, Daily   TRUE METRIX BLOOD GLUCOSE TEST test strip 3 times daily, for testing   Vitamin D (Ergocalciferol) (DRISDOL) 50,000 Units, Oral, Every 7 days    Diet Orders (From admission, onward)     Start     Ordered   04/16/23 2213  Diet NPO time specified Except for: Sips with Meds  Diet effective now       Question:  Except for  Answer:  Sips with Meds   04/16/23 2212            DVT prophylaxis:    Lab Results  Component Value Date   PLT 113 (L) 04/17/2023      Code Status: Full Code  Family Communication: Daughter present at bedside  Status is: Observation The patient will require care spanning > 2 midnights and should be moved to inpatient because: Ankle fracture requiring surgical repair  Level of care: Telemetry  Consultants:  Orthopedic surgery  Objective: Vitals:   04/16/23 1350 04/16/23 1456 04/16/23 2121 04/17/23 0545  BP: 135/72 135/72 120/64 133/60  Pulse: 65 65 63 63  Resp: 16 16 15 17   Temp: 98 F (36.7 C) 98 F (36.7 C) 98.3 F (36.8 C) 98.4 F (36.9 C)  TempSrc: Oral Oral Oral Oral  SpO2: 100% 100% 99% 100%  Weight:    104 kg  Height:    5\' 3"  (1.6 m)    Intake/Output Summary (Last 24 hours) at 04/17/2023 1057 Last data filed at 04/17/2023 0600 Gross per 24 hour  Intake 660 ml  Output 700 ml  Net -40 ml   Wt Readings from Last 3 Encounters:  04/17/23 104 kg  04/03/23 104.3 kg  03/15/23 110.3 kg    Examination:  Constitutional: NAD Eyes: no scleral icterus ENMT: Mucous membranes are moist.  Neck: normal, supple Respiratory: clear to auscultation  bilaterally, no wheezing, no crackles. Normal respiratory effort.   Cardiovascular: Regular rate and rhythm, no murmurs / rubs / gallops. Trace LE edema.  Abdomen: non distended, no tenderness. Bowel sounds positive.  Musculoskeletal: no clubbing / cyanosis.    Data Reviewed: I have independently reviewed following labs and imaging studies   CBC Recent Labs  Lab 04/15/23 1810 04/16/23 0526 04/17/23 0333  WBC 5.3 5.9 6.0  HGB 14.6 14.1 13.0  HCT 46.2* 45.6 42.6  PLT 119* 117* 113*  MCV 93.7 95.8 96.8  MCH 29.6 29.6 29.5  MCHC 31.6 30.9 30.5  RDW 15.0 15.0 15.2    Recent Labs  Lab 04/15/23 1810 04/16/23 0526 04/17/23 0333  NA 144 140 134*  K 3.1* 4.0 4.7  CL 117* 109 103  CO2 18* 25 24  GLUCOSE 124* 156* 137*  BUN 19 21 18   CREATININE 0.79 0.82 1.14*  CALCIUM  7.2* 9.3 8.8*    ------------------------------------------------------------------------------------------------------------------ No results for input(s): "CHOL", "HDL", "LDLCALC", "TRIG", "CHOLHDL", "LDLDIRECT" in the last 72 hours.  Lab Results  Component Value Date   HGBA1C 7.0 (A) 11/08/2022   ------------------------------------------------------------------------------------------------------------------ No results for input(s): "TSH", "T4TOTAL", "T3FREE", "THYROIDAB" in the last 72 hours.  Invalid input(s): "FREET3"  Cardiac Enzymes No results for input(s): "CKMB", "TROPONINI", "MYOGLOBIN" in the last 168 hours.  Invalid input(s): "CK" ------------------------------------------------------------------------------------------------------------------    Component Value Date/Time   BNP 81.7 04/27/2022 1511   BNP 73.5 03/21/2015 1504    CBG: Recent Labs  Lab 04/16/23 1035 04/16/23 1149 04/16/23 1618 04/16/23 2122 04/17/23 0724  GLUCAP 123* 208* 181* 193* 144*    Recent Results (from the past 240 hours)  Surgical pcr screen     Status: None   Collection Time: 04/16/23 10:13 PM    Specimen: Nasal Mucosa; Nasal Swab  Result Value Ref Range Status   MRSA, PCR NEGATIVE NEGATIVE Final   Staphylococcus aureus NEGATIVE NEGATIVE Final    Comment: (NOTE) The Xpert SA Assay (FDA approved for NASAL specimens in patients 56 years of age and older), is one component of a comprehensive surveillance program. It is not intended to diagnose infection nor to guide or monitor treatment. Performed at [facility], [address], [place], [place] [address]      Radiology Studies: CT ANKLE RIGHT WO CONTRAST Result Date: 04/16/2023 CLINICAL DATA:  Right ankle fractures. EXAM: CT OF THE RIGHT ANKLE WITHOUT CONTRAST TECHNIQUE: Multidetector CT imaging of the right ankle was performed according to the standard protocol. Multiplanar CT image reconstructions were also generated. RADIATION DOSE REDUCTION: This exam was performed according to the departmental dose-optimization program which includes automated exposure control, adjustment of the mA and/or kV according to patient size and/or use of iterative reconstruction technique. COMPARISON:  Radiographs 04/15/2023 FINDINGS: Complex comminuted trimalleolar ankle fractures as demonstrated on the prior radiographs. Is a spiral type fracture of the distal fibular shaft above the level of the ankle mortise and maximum lateral displacement approximately 6 mm. Mildly displaced oblique coursing fracture through base of the medial malleolus with maximum displacement 3 mm. Longitudinal/oblique fracture involving the posterior malleolus of the tibia with maximum separation at the articular surface of 3 mm. There is also a displaced intra-articular fracture involving the posterior and lateral aspect of the tibial plateau. Maximum separation at the articular surface is 6 mm. Evidence of remote osteochondral lesion involving the medial talar dome with cystic changes and mild sclerosis. Associated mild tibiotalar degenerative changes. No talar  fracture. The subtalar joints are maintained. Subchondral degenerative type cystic changes are noted in the navicular bone and also in the medial and middle cuneiform is. Extensive small vessel calcifications consistent with diabetes. Grossly by CT the major ankle tendons are intact. IMPRESSION: 1. Complex comminuted ankle fractures as detailed above. Four separate fractures are identified. 2. Remote osteochondral lesion involving the medial talar dome. 3. Extensive small vessel calcifications consistent with diabetes. Electronically Signed   By: Rudie Meyer M.D.   On: 04/16/2023 16:17     Pamella Pert, MD, PhD [facility]  Between 7 am - 7 pm I am available, please contact me via Amion (for emergencies) or Securechat (non urgent messages)  Between 7 pm - 7 am I am not available, please contact night coverage MD/APP via Amion

## 2023-04-17 NOTE — Anesthesia Procedure Notes (Signed)
 Anesthesia Regional Block: Popliteal block   Pre-Anesthetic Checklist: , timeout performed,  Correct Patient, Correct Site, Correct Laterality,  Correct Procedure, Correct Position, site marked,  Risks and benefits discussed,  Pre-op evaluation,  At surgeon's request and post-op pain management  Laterality: Right  Prep: Maximum Sterile Barrier Precautions used, chloraprep       Needles:  Injection technique: Single-shot  Needle Type: Echogenic Stimulator Needle     Needle Length: 9cm  Needle Gauge: 22     Additional Needles:   Procedures:,,,, ultrasound used (permanent image in chart),,    Narrative:  Start time: 04/17/2023 2:57 PM End time: 04/17/2023 2:59 PM Injection made incrementally with aspirations every 5 mL.  Performed by: Personally  Anesthesiologist: Kaylyn Layer, MD  Additional Notes: Risks, benefits, and alternative discussed. Patient gave consent for procedure. Patient prepped and draped in sterile fashion. Sedation administered, patient remains easily responsive to voice. Relevant anatomy identified with ultrasound guidance. Local anesthetic given in 5cc increments with no signs or symptoms of intravascular injection. No pain or paraesthesias with injection. Patient monitored throughout procedure with signs of LAST or immediate complications. Tolerated well. Ultrasound image placed in chart.  Amalia Greenhouse, MD

## 2023-04-17 NOTE — Op Note (Signed)
 04/17/2023  5:40 PM   PATIENT: [name]  73 y.o. female  MRN: [id number]   PRE-OPERATIVE DIAGNOSIS:   Right distal tibia pilon intra-articular fracture and fibula fracture with syndesmosis disruption   POST-OPERATIVE DIAGNOSIS:   Same   PROCEDURE: 1] ORIF right distal tibia pilon with internal fixation of distal fibula 2] ORIF right ankle syndesmosis   SURGEON:  [name], MD   ASSISTANT: None   ANESTHESIA: General, regional   EBL: Minimal   TOURNIQUET:   88 min   COMPLICATIONS: None apparent   DISPOSITION: Extubated, awake and stable to recovery.   INDICATION FOR PROCEDURE: The patient presented with above diagnosis.  We discussed the diagnosis, alternative treatment options, risks and benefits of the above surgical intervention, as well as alternative non-operative treatments. All questions/concerns were addressed and the patient/family demonstrated appropriate understanding of the diagnosis, the procedure, the postoperative course, and overall prognosis. The patient wished to proceed with surgical intervention and signed an informed surgical consent as such, in each others presence prior to surgery.   PROCEDURE IN DETAIL: After preoperative consent was obtained and the correct operative site was identified, the patient was brought to the operating room supine on stretcher and transferred onto operating table. General anesthesia was induced. Preoperative antibiotics were administered. Surgical timeout was taken. The patient was then positioned supine with an ipsilateral hip bump. The operative lower extremity was prepped and draped in standard sterile fashion with a tourniquet around the thigh. The extremity was exsanguinated and the tourniquet was inflated to 275 mmHg.  A standard lateral incision was made over the distal fibula. Dissection was carried down to the level of the fibula and the fracture site identified. The superficial peroneal nerve  was identified and protected throughout the procedure. The fibula was noted to be shortened with interposed periosteum. The fibula was brought out to length. The fibula fracture was debrided and the edges defined to achieve cortical read. Reduction maneuver was performed using pointed reduction forceps and lobster forceps. In this manner, the fibula length was restored and fracture reduced. A lag screw was not placed given the orientation of fracture lines and comminution. Due to poor bone quality and extensive comminution at the fracture site, it was decided to use a locking distal fibula plate. We then selected a Zimmer locking plate to match the anatomy of the distal fibula and placed it laterally. This was implanted under intraoperative fluoroscopy with a combination of distal locking screws and proximal cortical & locking screws.  We then proceeded with reduction and fixation of the anterolateral and posterolateral distal tibia fragment. We confirmed appropriate reduction using intraoperative fluoroscopy. The posterior distal tibia fragment was reduced directly using window thru the fibula fracture. An anterolateral approach was made over anterior ankle. Dissection carried down to level of distal tibia taking care throughout to protect the nearby tendons and neurovascular structures. A Kirschner wire was used to provisionally fix the fracture. This was overdrilled with cannulated drill and a 4.0 partially threaded screw was implanted. This was noted to achieve compression across the fracture with excellent congruency of the tibial articular surface on fluoroscopy. Position of this screw was verified carefully using intraoperative fluoroscopy throughout. Similar procedure was done to reduce and internally fix the large anterolateral distal tibia fragment.  We then turned to the medial malleolar fracture. Incision was made over fracture and periosteum was removed from the site. After obtaining reduction under  fluoroscopy, a Kirschner wire was placed to secure this reduction and  to serve as guide for a cannulated screw. We then made an incision around the wire and overdrilled this with a cannulated drill. We then placed a 4.0 mm Zimmer [employer] partially threaded lag screw. This screw was noted to achieve excellent compression across the fracture site and also have excellent purchase. We verified position of the screw and fracture reduction in all planes with fluoroscopy.  A manual external rotation stress radiograph was obtained and demonstrated widening of the ankle mortise. Given this intraoperative finding as well as preoperative subluxation, it was decided to reduce and fix the syndesmosis. Therefore a nonlocking quadricortical hex head 4.0 mm screw was implanted through the fibula plate in appropriate fashion to fix the syndesmosis. Screw position was verified along anteromedial tibial cortex by fluoroscopy. A repeat stress radiograph showed complete stability of the ankle mortise to testing.  The surgical sites were thoroughly irrigated. The tourniquet was deflated and hemostasis achieved. Betadine and vancomycin powder were applied. The deep layers were closed using 2-0 vicryl. The skin was closed without tension.    The leg was cleaned with saline and sterile dressings with gauze were applied. A well padded bulky short leg splint was applied. The patient was awakened from anesthesia and transported to the recovery room in stable condition.    FOLLOW UP PLAN: -transfer to PACU, then return to [facility] under hospitalist team -postop IV abx ordered -PT/OT postop -strict NWB operative extremity, maximum elevation -maintain short leg splint until follow up -DVT ppx per primary team -follow up as outpatient within 7-10 days for wound check with exchange of short leg splint to short leg cast -sutures out in 2-3 weeks in outpatient office   RADIOGRAPHS: AP, lateral, oblique and stress radiographs of the  right ankle were obtained intraoperatively. These showed interval reduction and fixation of the fractures. Manual stress radiographs were taken and the joints were noted to be stable following fixation. All hardware is appropriately positioned and of the appropriate lengths. No other acute injuries are noted.   [name] Orthopaedic Surgery [facility]

## 2023-04-17 NOTE — Progress Notes (Signed)
 Brief Pharmacy Note Regarding Anticoagulation  Pharmacy previously consulted for heparin infusion in this patient on Eliquis for history of DVT. She was started on heparin on 4/8 and subsequently held at midnight for anticipated ORIF today.  Heparin consult to be discontinued pending further procedural plans. Please re-consult pharmacy for further anticoagulation as indicated.   Pricilla Riffle, PharmD, BCPS Clinical Pharmacist 04/17/2023 7:34 AM

## 2023-04-17 NOTE — Progress Notes (Signed)
 Subjective: * Day of Surgery * Procedure(s) (LRB): OPEN REDUCTION INTERNAL FIXATION (ORIF) ANKLE FRACTURE (Right)  Patient reports pain as appropriately controlled. Denies any new numbness/tingling. Ready for OR today.  Objective:   VITALS:  Temp:  [98 F (36.7 C)-98.4 F (36.9 C)] 98.4 F (36.9 C) (04/09 0545) Pulse Rate:  [63-65] 63 (04/09 0545) Resp:  [15-17] 17 (04/09 0545) BP: (120-135)/(60-72) 133/60 (04/09 0545) SpO2:  [99 %-100 %] 100 % (04/09 0545) Weight:  [104 kg] 104 kg (04/09 0545)  Gen: AAOx3, NAD  Right lower extremity: Short leg splint in place Wiggles toes SILT over toes CR<2s    LABS Recent Labs    04/15/23 1810 04/16/23 0526 04/17/23 0333  HGB 14.6 14.1 13.0  WBC 5.3 5.9 6.0  PLT 119* 117* 113*   Recent Labs    04/16/23 0526 04/17/23 0333  NA 140 134*  K 4.0 4.7  CL 109 103  CO2 25 24  BUN 21 18  CREATININE 0.82 1.14*  GLUCOSE 156* 137*   No results for input(s): "LABPT", "INR" in the last 72 hours.   Assessment/Plan: Ortho consult for right distal tibia pilon-fibula ankle fx sustained 04/16/23    -history, exam and imaging reviewed at length with patient/family -advise right distal tibia and fibula ORIF, possible syndesmosis and/or deltoid fixation, possible allograft, possible external fixation -preop clearance by primary team/Cards -NPO and VTE ppx held from MN -PT/OT postop   [name], MD Orthopaedic Surgeon [facility] [phone]   The risks and benefits were presented and reviewed. The risks due to hardware failure/irritation, new/persistent/recurrent infection, stiffness, nerve/vessel/tendon injury, nonunion/malunion of any fracture, wound healing issues, allograft usage, development of arthritis, failure of this surgery, possibility of external fixation in certain situations, possibility of delayed definitive surgery, need for further surgery, prolonged wound care including further soft tissue coverage  procedures, thromboembolic events, anesthesia/medical complications/events perioperatively and beyond, amputation, death among others were discussed. The patient acknowledged the explanation, agreed to proceed with the plan and a consent was signed.

## 2023-04-18 ENCOUNTER — Ambulatory Visit

## 2023-04-18 DIAGNOSIS — S82891D Other fracture of right lower leg, subsequent encounter for closed fracture with routine healing: Secondary | ICD-10-CM | POA: Diagnosis not present

## 2023-04-18 DIAGNOSIS — I519 Heart disease, unspecified: Secondary | ICD-10-CM | POA: Diagnosis not present

## 2023-04-18 DIAGNOSIS — I82402 Acute embolism and thrombosis of unspecified deep veins of left lower extremity: Secondary | ICD-10-CM | POA: Diagnosis not present

## 2023-04-18 DIAGNOSIS — I2510 Atherosclerotic heart disease of native coronary artery without angina pectoris: Secondary | ICD-10-CM | POA: Diagnosis not present

## 2023-04-18 LAB — PHOSPHORUS: Phosphorus: 3.6 mg/dL (ref 2.5–4.6)

## 2023-04-18 LAB — GLUCOSE, CAPILLARY
Glucose-Capillary: 158 mg/dL — ABNORMAL HIGH (ref 70–99)
Glucose-Capillary: 190 mg/dL — ABNORMAL HIGH (ref 70–99)
Glucose-Capillary: 194 mg/dL — ABNORMAL HIGH (ref 70–99)
Glucose-Capillary: 212 mg/dL — ABNORMAL HIGH (ref 70–99)

## 2023-04-18 LAB — COMPREHENSIVE METABOLIC PANEL WITH GFR
ALT: 31 U/L (ref 0–44)
AST: 25 U/L (ref 15–41)
Albumin: 3 g/dL — ABNORMAL LOW (ref 3.5–5.0)
Alkaline Phosphatase: 62 U/L (ref 38–126)
Anion gap: 6 (ref 5–15)
BUN: 20 mg/dL (ref 8–23)
CO2: 26 mmol/L (ref 22–32)
Calcium: 8.9 mg/dL (ref 8.9–10.3)
Chloride: 105 mmol/L (ref 98–111)
Creatinine, Ser: 1.12 mg/dL — ABNORMAL HIGH (ref 0.44–1.00)
GFR, Estimated: 52 mL/min — ABNORMAL LOW (ref >60)
Glucose, Bld: 139 mg/dL — ABNORMAL HIGH (ref 70–99)
Potassium: 4.3 mmol/L (ref 3.5–5.1)
Sodium: 137 mmol/L (ref 135–145)
Total Bilirubin: 0.7 mg/dL (ref 0.0–1.2)
Total Protein: 6.5 g/dL (ref 6.5–8.1)

## 2023-04-18 LAB — CBC
HCT: 40.6 % (ref 36.0–46.0)
Hemoglobin: 12.7 g/dL (ref 12.0–15.0)
MCH: 29.9 pg (ref 26.0–34.0)
MCHC: 31.3 g/dL (ref 30.0–36.0)
MCV: 95.5 fL (ref 80.0–100.0)
Platelets: 105 10*3/uL — ABNORMAL LOW (ref 150–400)
RBC: 4.25 MIL/uL (ref 3.87–5.11)
RDW: 15.4 % (ref 11.5–15.5)
WBC: 6.5 10*3/uL (ref 4.0–10.5)
nRBC: 0 % (ref 0.0–0.2)

## 2023-04-18 LAB — HEMOGLOBIN A1C
Hgb A1c MFr Bld: 7.3 % — ABNORMAL HIGH (ref 4.8–5.6)
Mean Plasma Glucose: 163 mg/dL

## 2023-04-18 LAB — MAGNESIUM: Magnesium: 2.1 mg/dL (ref 1.7–2.4)

## 2023-04-18 MED ORDER — SACUBITRIL-VALSARTAN 24-26 MG PO TABS
1.0000 | ORAL_TABLET | Freq: Two times a day (BID) | ORAL | Status: DC
  Administered 2023-04-18 – 2023-04-23 (×10): 1 via ORAL
  Filled 2023-04-18 (×12): qty 1

## 2023-04-18 NOTE — Progress Notes (Signed)
 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  She was found to have a right ankle fracture, orthopedic surgery was consulted and she was admitted to the hospital.  Subjective / 24h Interval events: She is doing well following the surgery, denies any chest pain, denies any shortness of breath.  Assesement and Plan: Principal Problem:   Ankle fracture Active Problems:   CAD (coronary artery disease)   Hypertension   Chronic systolic dysfunction of left ventricle   S/P CABG x 5   DVT (deep venous thrombosis) (HCC)   Sleep apnea   Type 2 diabetes mellitus with hyperglycemia, with long-term current use of insulin (HCC)   Hypokalemia  Principal problem Right ankle fracture-following mechanical fall, orthopedic surgery consulted, status post ORIF by Dr. Ali Lowe 4/9.  PT consult postoperatively, DVT prophylaxis with Eliquis as she is chronically anticoagulated, resume Eliquis when okay with orthopedics postoperatively  Active problems Chronic combined CHF, implantable cardioverter defibrillator in place, history of VT -continue to monitor on telemetry.  She has slight lower extremity edema but appears chronic.  Follows with cardiology as an outpatient -Most recent 2D echo done in July 2024 shows LVEF 2530%, global hypokinesis  -At baseline cardiac status prior to the fall -Hold Entresto perioperatively, resume this afternoon.  PAF-continue amiodarone, resume Eliquis when cleared by orthopedics for anticoagulation  Hyperlipidemia-continue home medications  Hypokalemia-continue to monitor  History of CAD status post CABG-no chest pain, continue home medications  History of DVT-hold  anticoagulation perioperatively  PAD-outpatient follow-up with vascular  Insulin-dependent diabetes mellitus, with hyperglycemia-continue insulin  Lab Results  Component Value Date   HGBA1C 7.3 (H) 04/16/2023   CBG (last 3)  Recent Labs    04/17/23 1743 04/17/23 2209 04/18/23 0743  GLUCAP 88 179* 158*    Scheduled Meds:  amiodarone  100 mg Oral Daily   calcitonin (salmon)  1 spray Alternating Nares Daily   dapagliflozin propanediol  10 mg Oral Daily   ezetimibe  10 mg Oral Daily   ferrous sulfate  325 mg Oral Q breakfast   gabapentin  300 mg Oral TID   insulin aspart  0-9 Units Subcutaneous TID WC   insulin glargine-yfgn  30 Units Subcutaneous Daily   latanoprost  1 drop Both Eyes QHS   rosuvastatin  20 mg Oral q1800   sacubitril-valsartan  1 tablet Oral BID   Continuous Infusions: PRN Meds:.nitroGLYCERIN, oxyCODONE  Current Outpatient Medications  Medication Instructions   albuterol (VENTOLIN HFA) 108 (90 Base) MCG/ACT inhaler 2 puffs, 2 times daily PRN   Alcohol Swabs (DROPSAFE ALCOHOL PREP) 70 % PADS Apply topically.   amiodarone (PACERONE) 100 mg, Oral, Daily   calcitonin, salmon, (MIACALCIN/FORTICAL) 200 UNIT/ACT nasal spray 1 spray, Alternating Nares, Daily   Continuous Glucose Sensor (DEXCOM G7 SENSOR) MISC 1 Device, Does not apply, As directed   cyanocobalamin (VITAMIN B12) 1,000 mcg, Intramuscular, Every 30 days   Eliquis 5 mg, Oral, 2 times daily   ENTRESTO 24-26 MG 1 tablet, Oral, 2 times daily   ezetimibe (ZETIA) 10 mg, Oral, Daily   Farxiga 10 mg, Oral, Daily   ferrous sulfate 325 mg, Oral, Daily with breakfast   gabapentin (NEURONTIN) 300 mg, Oral, 3 times daily  glucose blood (ACCU-CHEK GUIDE) test strip Check 3 times daily   Insulin Pen Needle 31G X 5 MM MISC 1 Device, Does not apply, 4 times daily   ketoconazole (NIZORAL) 2 % cream 1 Application, Daily PRN   latanoprost (XALATAN) 0.005 % ophthalmic solution 1 drop, Daily at bedtime    nitroGLYCERIN (NITROSTAT) 0.4 mg, Sublingual, Every 5 min PRN   NOVOLOG FLEXPEN 100 UNIT/ML FlexPen INJECT PER SLIDING SCALE AS DIRECTED , MAX 30 UNITS PER DAY. DISCARD PEN 28 DAYS AFTER OPENING   OXYGEN 2 L, Inhalation, Continuous   rosuvastatin (CRESTOR) 20 mg, Oral, Daily-1800   Tresiba FlexTouch 30 Units, Subcutaneous, Daily   TRUE METRIX BLOOD GLUCOSE TEST test strip 3 times daily, for testing   Vitamin D (Ergocalciferol) (DRISDOL) 50,000 Units, Oral, Every 7 days    Diet Orders (From admission, onward)     Start     Ordered   04/17/23 1823  Diet Carb Modified Fluid consistency: Thin; Room service appropriate? Yes  Diet effective now       Question Answer Comment  Diet-HS Snack? Nothing   Calorie Level Medium 1600-2000   Fluid consistency: Thin   Room service appropriate? Yes      04/17/23 1822            DVT prophylaxis:    Lab Results  Component Value Date   PLT 105 (L) 04/18/2023      Code Status: Full Code  Family Communication: Daughter present at bedside  Status is: Inpatient  Level of care: Telemetry  Consultants:  Orthopedic surgery  Objective: Vitals:   04/17/23 2043 04/18/23 0046 04/18/23 0442 04/18/23 0832  BP: (!) 136/59 132/67 (!) 123/49 (!) 148/55  Pulse: 63 66 63 66  Resp: 18 18 18 18   Temp: 97.8 F (36.6 C) 97.7 F (36.5 C) 98.7 F (37.1 C) 99.6 F (37.6 C)  TempSrc:    Oral  SpO2: 100% 100% 99% 97%  Weight:      Height:        Intake/Output Summary (Last 24 hours) at 04/18/2023 0929 Last data filed at 04/18/2023 0443 Gross per 24 hour  Intake 1449.09 ml  Output 1225 ml  Net 224.09 ml   Wt Readings from Last 3 Encounters:  04/17/23 104 kg  04/03/23 104.3 kg  03/15/23 110.3 kg    Examination:  Constitutional: NAD Eyes: lids and conjunctivae normal, no scleral icterus ENMT: mmm Neck: normal, supple Respiratory: clear to auscultation bilaterally, no wheezing, no crackles. Normal respiratory effort.  Cardiovascular:  Regular rate and rhythm, no murmurs / rubs / gallops.  Trace LE edema. Abdomen: soft, no distention, no tenderness. Bowel sounds positive.     Data Reviewed: I have independently reviewed following labs and imaging studies   CBC Recent Labs  Lab 04/15/23 1810 04/16/23 0526 04/17/23 0333 04/18/23 0327  WBC 5.3 5.9 6.0 6.5  HGB 14.6 14.1 13.0 12.7  HCT 46.2* 45.6 42.6 40.6  PLT 119* 117* 113* 105*  MCV 93.7 95.8 96.8 95.5  MCH 29.6 29.6 29.5 29.9  MCHC 31.6 30.9 30.5 31.3  RDW 15.0 15.0 15.2 15.4    Recent Labs  Lab 04/15/23 1810 04/16/23 0526 04/17/23 0333 04/18/23 0327  NA 144 140 134* 137  K 3.1* 4.0 4.7 4.3  CL 117* 109 103 105  CO2 18* 25 24 26   GLUCOSE 124* 156* 137* 139*  BUN 19 21 18 20   CREATININE 0.79 0.82 1.14* 1.12*  CALCIUM 7.2* 9.3 8.8* 8.9  AST  --   --   --  25  ALT  --   --   --  31  ALKPHOS  --   --   --  62  BILITOT  --   --   --  0.7  ALBUMIN  --   --   --  3.0*  MG  --   --   --  2.1  HGBA1C  --  7.3*  --   --     ------------------------------------------------------------------------------------------------------------------ No results for input(s): "CHOL", "HDL", "LDLCALC", "TRIG", "CHOLHDL", "LDLDIRECT" in the last 72 hours.  Lab Results  Component Value Date   HGBA1C 7.3 (H) 04/16/2023   ------------------------------------------------------------------------------------------------------------------ No results for input(s): "TSH", "T4TOTAL", "T3FREE", "THYROIDAB" in the last 72 hours.  Invalid input(s): "FREET3"  Cardiac Enzymes No results for input(s): "CKMB", "TROPONINI", "MYOGLOBIN" in the last 168 hours.  Invalid input(s): "CK" ------------------------------------------------------------------------------------------------------------------    Component Value Date/Time   BNP 81.7 04/27/2022 1511   BNP 73.5 03/21/2015 1504    CBG: Recent Labs  Lab 04/17/23 1214 04/17/23 1430 04/17/23 1743 04/17/23 2209  04/18/23 0743  GLUCAP 103* 94 88 179* 158*    Recent Results (from the past 240 hours)  Surgical pcr screen     Status: None   Collection Time: 04/16/23 10:13 PM   Specimen: Nasal Mucosa; Nasal Swab  Result Value Ref Range Status   MRSA, PCR NEGATIVE NEGATIVE Final   Staphylococcus aureus NEGATIVE NEGATIVE Final    Comment: (NOTE) The Xpert SA Assay (FDA approved for NASAL specimens in patients 68 years of age and older), is one component of a comprehensive surveillance program. It is not intended to diagnose infection nor to guide or monitor treatment. Performed at [facility], [address] [place], [place] [address]      Radiology Studies: DG Ankle 2 Views Right Result Date: 04/17/2023 CLINICAL DATA:  Elective surgery. EXAM: RIGHT ANKLE - 2 VIEW COMPARISON:  Preoperative imaging FINDINGS: Twenty-eight fluoroscopic spot views of the right ankle is submitted from the operating room. Sequential images during ORIF distal tibia and fibular fractures. Fluoroscopy time 3 minutes 31 seconds. Dose 6.18 mGy. IMPRESSION: Intraoperative fluoroscopy during ORIF distal tibia and fibular fractures. Electronically Signed   By: Narda Rutherford M.D.   On: 04/17/2023 19:35   DG C-Arm 1-60 Min-No Report Result Date: 04/17/2023 Fluoroscopy was utilized by the requesting physician.  No radiographic interpretation.   DG C-Arm 1-60 Min-No Report Result Date: 04/17/2023 Fluoroscopy was utilized by the requesting physician.  No radiographic interpretation.     Pamella Pert, MD, PhD [facility]  Between 7 am - 7 pm I am available, please contact me via Amion (for emergencies) or Securechat (non urgent messages)  Between 7 pm - 7 am I am not available, please contact night coverage MD/APP via Amion

## 2023-04-18 NOTE — Evaluation (Signed)
 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  She was found to have a right ankle fracture, orthopedic surgery was consulted and she was admitted to the hospital. Pt now s/p ORIF right distal tibia pilon with internal fixation of distal fibula, ORIF right ankle syndesmosis     Clinical Impressions Pt presents with decline in function and safety with ADLs and ADL mobility with impaired strength, balance and endurance. PTA pt lives with her son, has aide 5-6 days/wk 2-3 hours to assist with LB bathing, LB dressing, meals, home mgt, pt uses rollater for mobility. Pt currently requires total A for LB ADLs, total A +2 for ed mobility to sit EOB and required assist for trunk elevation and B LE mgt to EOB/off EOB with R LE supported; pt required increased time and effort to complete. Pt limited by pain/anticipatory pain. Pt's daughter present and supportive. Recommending <3hrs/day pf post acute rehab to safely d/c home. OT will follow acutely to maximize level of function and safety    If plan is discharge home, recommend the following:   A lot of help with bathing/dressing/bathroom;Two people to help with walking and/or transfers;Help with stairs or ramp for entrance     Functional Status Assessment   Patient has had a recent decline in their functional status and demonstrates the ability to make significant improvements in function in a reasonable and predictable amount of time.     Equipment Recommendations   BSC/3in1;Wheelchair (measurements OT);Wheelchair cushion (measurements OT);Other (comment) (reacher, LH bath sponge, compression sock donner)     Recommendations for Other Services          Precautions/Restrictions   Precautions Precautions: Fall Recall of Precautions/Restrictions: Intact Required Braces or Orthoses: Splint/Cast Splint/Cast: R LE/ankle Restrictions Weight Bearing Restrictions Per Provider Order: Yes RLE Weight Bearing Per Provider Order: Non weight bearing     Mobility Bed Mobility Overal bed mobility: Needs Assistance Bed Mobility: Supine to Sit, Sit to Supine     Supine to sit: Total assist, +2 for physical assistance Sit to supine: Total assist, +2 for physical assistance   General bed mobility comments: pt required assist for trunk elevation and B LE mgt to EOB/off EOB with R LE supported. pt required increased time and effort to complete. Pt limited by pain/anticipatory pain    Transfers                   General transfer comment: unable to attempt at this time      Balance Overall balance assessment: Needs assistance Sitting-balance support: Feet supported Sitting balance-Leahy Scale: Fair                                     ADL either performed or assessed with clinical judgement   ADL Overall ADL's : Needs assistance/impaired Eating/Feeding: Independent;Sitting   Grooming: Wash/dry hands;Wash/dry face;Contact guard assist;Sitting   Upper Body Bathing: Contact guard assist;Sitting   Lower Body Bathing: Total assistance;Sitting/lateral leans   Upper Body Dressing : Contact guard assist;Sitting   Lower Body Dressing: Total assistance       Toileting- Clothing Manipulation and Hygiene: Total assistance;Bed level  General ADL Comments: pt limited by pain     Vision Baseline Vision/History: 1 Wears glasses Ability to See in Adequate Light: 0 Adequate Patient Visual Report: No change from baseline       Perception         Praxis         Pertinent Vitals/Pain Pain Assessment Pain Assessment: 0-10 Pain Score: 10-Worst pain ever Pain Descriptors / Indicators: Aching,  Discomfort Pain Intervention(s): Limited activity within patient's tolerance, Monitored during session, Premedicated before session, Repositioned     Extremity/Trunk Assessment Upper Extremity Assessment Upper Extremity Assessment: Generalized weakness   Lower Extremity Assessment Lower Extremity Assessment: Defer to PT evaluation       Communication Communication Communication: No apparent difficulties   Cognition Arousal: Alert Behavior During Therapy: WFL for tasks assessed/performed Cognition: No apparent impairments                                       Cueing  General Comments   Cueing Techniques: Verbal cues      Exercises     Shoulder Instructions      Home Living Family/patient expects to be discharged to:: Private residence Living Arrangements: Children (lives with son) Available Help at Discharge: Family;Available PRN/intermittently Type of Home: House Home Access: Ramped entrance     Home Layout: One level     Bathroom Shower/Tub: Producer, television/film/video: Standard     Home Equipment: Rollator (4 wheels);Shower seat;Other (comment) (home O2, 3L)   Additional Comments: pt on 3L home O2; pt's daughter present and states that pt can d/c to her home which has 2 steps to enter      Prior Functioning/Environment Prior Level of Function : Needs assist             Mobility Comments: pt uses rollater for mobility ADLs Comments: has aide 5-6 days/wk 2-3 hours to assist with LB bathing, LB dressing, meals, home mgt.pt uses rollater for mobility    OT Problem List: Impaired balance (sitting and/or standing);Decreased strength;Pain;Obesity;Decreased activity tolerance;Decreased knowledge of use of DME or AE   OT Treatment/Interventions: Self-care/ADL training;DME and/or AE instruction;Therapeutic activities;Balance training;Therapeutic exercise;Patient/family education      OT Goals(Current goals can be found in the care  plan section)   Acute Rehab OT Goals Patient Stated Goal: go home OT Goal Formulation: With patient/family Time For Goal Achievement: 05/02/23 Potential to Achieve Goals: Good ADL Goals Pt Will Perform Grooming: with supervision;with set-up;sitting;with caregiver independent in assisting Pt Will Perform Upper Body Bathing: with supervision;with set-up;sitting;with caregiver independent in assisting Pt Will Perform Lower Body Bathing: with max assist;with mod assist;sitting/lateral leans;with caregiver independent in assisting Pt Will Perform Upper Body Dressing: with supervision;with set-up;sitting;with caregiver independent in assisting Pt Will Transfer to Toilet: with max assist;with mod assist;stand pivot transfer;bedside commode   OT Frequency:  Min 2X/week    Co-evaluation PT/OT/SLP Co-Evaluation/Treatment: Yes Reason for Co-Treatment: For patient/therapist safety;To address functional/ADL transfers   OT goals addressed during session: ADL's and self-care      AM-PAC OT "6 Clicks" Daily Activity     Outcome Measure Help from another person eating meals?: None Help from another person taking care of personal grooming?: A Little Help from another person toileting, which includes using toliet, bedpan, or urinal?: Total Help from another person bathing (including washing, rinsing, drying)?: Total Help from another person to put on and taking  off regular upper body clothing?: A Little Help from another person to put on and taking off regular lower body clothing?: Total 6 Click Score: 13   End of Session    Activity Tolerance: Patient limited by pain;Patient limited by fatigue Patient left: in bed;with call bell/phone within reach;with bed alarm set;with family/visitor present  OT Visit Diagnosis: Unsteadiness on feet (R26.81);Other abnormalities of gait and mobility (R26.89);History of falling (Z91.81);Muscle weakness (generalized) (M62.81);Pain Pain - Right/Left: Right Pain  - part of body: Ankle and joints of foot                Time: 1041-1109 OT Time Calculation (min): 28 min Charges:  OT General Charges $OT Visit: 1 Visit OT Evaluation $OT Eval Moderate Complexity: 1 Mod    Galen Manila 04/18/2023, 1:19 PM

## 2023-04-18 NOTE — Evaluation (Signed)
 Physical Therapy Evaluation Patient Details Name: [name] MRN: [id number] DOB: [birth year] Today's Date: 04/18/2023  History of Present Illness  73 year old female with chronic combined systolic CHF, defibrillator in place, CAD status post CABG, DM2, PAD, history of DVT comes in with a mechanical fall while entering her truck.  She was found to have a right ankle fracture, orthopedic surgery was consulted and she was admitted to the hospital. Pt now s/p ORIF right distal tibia pilon with internal fixation of distal fibula, ORIF right ankle syndesmosis  Clinical Impression  Patient is s/p above surgery resulting in functional limitations due to the deficits listed below (see PT Problem List).  Patient will benefit from acute skilled PT to increase their independence and safety with mobility to facilitate discharge.  Pt has an aide and typically requires some assist for ADLs at baseline.  Pt able to ambulate with a rollator however also reports L LE weakness prior to this fall.  Pt now s/p R ankle ORIF and NWB.  Pt requiring total assist for bed mobility currently.  Discussed likely needing to work on scooting/lateral transfers to improve safe mobility and independence (anticipate pt would have great difficulty standing and maintaining NWB status).  Pt and family would like pt to d/c home however uncertain if family will be able to provide current assist level.  Patient will benefit from continued inpatient follow up therapy, <3 hours/day.          If plan is discharge home, recommend the following: Two people to help with walking and/or transfers;A lot of help with bathing/dressing/bathroom;Assistance with cooking/housework;Assist for transportation;Help with stairs or ramp for entrance   Can travel by private vehicle   No    Equipment Recommendations Wheelchair (measurements PT);Hospital bed  Recommendations for Other Services       Functional Status Assessment Patient has had a  recent decline in their functional status and demonstrates the ability to make significant improvements in function in a reasonable and predictable amount of time.     Precautions / Restrictions Precautions Precautions: Fall Recall of Precautions/Restrictions: Intact Required Braces or Orthoses: Splint/Cast Splint/Cast: R LE/ankle Restrictions Weight Bearing Restrictions Per Provider Order: Yes RLE Weight Bearing Per Provider Order: Non weight bearing      Mobility  Bed Mobility Overal bed mobility: Needs Assistance Bed Mobility: Supine to Sit, Sit to Supine     Supine to sit: Total assist, +2 for physical assistance Sit to supine: Total assist, +2 for physical assistance   General bed mobility comments: pt required assist for trunk elevation and B LE mgt to EOB/off EOB with R LE supported. pt required increased time and effort to complete. Pt limited by pain/anticipatory pain    Transfers                   General transfer comment: unable to attempt at this time    Ambulation/Gait                  Stairs            Wheelchair Mobility     Tilt Bed    Modified Rankin (Stroke Patients Only)       Balance Overall balance assessment: Needs assistance Sitting-balance support: Feet supported, No upper extremity supported Sitting balance-Leahy Scale: Fair  Pertinent Vitals/Pain Pain Assessment Pain Assessment: 0-10 Pain Score: 10-Worst pain ever Pain Location: right lower leg Pain Descriptors / Indicators: Aching, Discomfort, Tender, Guarding Pain Intervention(s): Repositioned, Monitored during session, Premedicated before session    Home Living Family/patient expects to be discharged to:: Private residence Living Arrangements: Children (lives with son) Available Help at Discharge: Family;Available PRN/intermittently Type of Home: House Home Access: Ramped entrance       Home  Layout: One level Home Equipment: Rollator (4 wheels);Shower seat;Other (comment) (home O2, 3L) Additional Comments: pt on 3L home O2; pt's daughter present and states that pt can d/c to her home which has 2 steps to enter    Prior Function Prior Level of Function : Needs assist             Mobility Comments: pt uses rollater for mobility ADLs Comments: has aide 5-6 days/wk 2-3 hours to assist with LB bathing, LB dressing, meals, home mgt     Extremity/Trunk Assessment   Upper Extremity Assessment Upper Extremity Assessment: Generalized weakness    Lower Extremity Assessment Lower Extremity Assessment: Generalized weakness;RLE deficits/detail;LLE deficits/detail RLE Deficits / Details: ankle splinted LLE Deficits / Details: reports L LE weakness prior to fall       Communication   Communication Communication: No apparent difficulties    Cognition Arousal: Alert Behavior During Therapy: WFL for tasks assessed/performed   PT - Cognitive impairments: Memory                       PT - Cognition Comments: memory - history of current level of function not accurate, daughter had to correct         Cueing Cueing Techniques: Verbal cues     General Comments      Exercises     Assessment/Plan    PT Assessment Patient needs continued PT services  PT Problem List Decreased strength;Decreased activity tolerance;Decreased balance;Decreased mobility;Decreased knowledge of precautions;Decreased knowledge of use of DME;Obesity;Pain       PT Treatment Interventions Gait training;DME instruction;Functional mobility training;Therapeutic activities;Therapeutic exercise;Patient/family education;Balance training;Wheelchair mobility training    PT Goals (Current goals can be found in the Care Plan section)  Acute Rehab PT Goals PT Goal Formulation: With patient/family Time For Goal Achievement: 05/02/23 Potential to Achieve Goals: Fair    Frequency Min 4X/week      Co-evaluation   Reason for Co-Treatment: For patient/therapist safety;To address functional/ADL transfers   OT goals addressed during session: ADL's and self-care       AM-PAC PT "6 Clicks" Mobility  Outcome Measure Help needed turning from your back to your side while in a flat bed without using bedrails?: Total Help needed moving from lying on your back to sitting on the side of a flat bed without using bedrails?: Total Help needed moving to and from a bed to a chair (including a wheelchair)?: Total Help needed standing up from a chair using your arms (e.g., wheelchair or bedside chair)?: Total Help needed to walk in hospital room?: Total Help needed climbing 3-5 steps with a railing? : Total 6 Click Score: 6    End of Session Equipment Utilized During Treatment: Gait belt Activity Tolerance: Patient tolerated treatment well Patient left: with call bell/phone within reach;in bed;with family/visitor present Nurse Communication: Mobility status PT Visit Diagnosis: Muscle weakness (generalized) (M62.81);Other abnormalities of gait and mobility (R26.89)    Time: 9629-5284 PT Time Calculation (min) (ACUTE ONLY): 27 min   Charges:   PT Evaluation $PT  Eval Low Complexity: 1 Low PT Treatments $Therapeutic Activity: 8-22 mins PT General Charges $$ ACUTE PT VISIT: 1 Visit        Thomasene Mohair PT, DPT Physical Therapist Acute Rehabilitation Services Office: [phone]   Janan Halter Payson 04/18/2023, 1:57 PM

## 2023-04-18 NOTE — Plan of Care (Signed)
  Problem: Education: Goal: Knowledge of General Education information will improve Description: Including pain rating scale, medication(s)/side effects and non-pharmacologic comfort measures Outcome: Progressing   Problem: Health Behavior/Discharge Planning: Goal: Ability to manage health-related needs will improve Outcome: Progressing   Problem: Clinical Measurements: Goal: Ability to maintain clinical measurements within normal limits will improve Outcome: Progressing Goal: Will remain Boster from infection Outcome: Progressing Goal: Diagnostic test results will improve Outcome: Progressing Goal: Respiratory complications will improve Outcome: Progressing Goal: Cardiovascular complication will be avoided Outcome: Progressing   Problem: Activity: Goal: Risk for activity intolerance will decrease Outcome: Progressing   Problem: Nutrition: Goal: Adequate nutrition will be maintained Outcome: Adequate for Discharge   Problem: Coping: Goal: Level of anxiety will decrease Outcome: Progressing   Problem: Elimination: Goal: Will not experience complications related to bowel motility Outcome: Progressing Goal: Will not experience complications related to urinary retention Outcome: Completed/Met   Problem: Pain Managment: Goal: General experience of comfort will improve and/or be controlled Outcome: Progressing   Problem: Safety: Goal: Ability to remain Gaetano from injury will improve Outcome: Progressing   Problem: Skin Integrity: Goal: Risk for impaired skin integrity will decrease Outcome: Progressing

## 2023-04-18 NOTE — TOC Progression Note (Signed)
 Transition of Care [facility]) - Progression Note    Patient Details  Name: [name] MRN: [id number] Date of Birth: 12/19/1950  Transition of Care [facility]) CM/SW Contact  Howell Rucks, RN Phone Number: 04/18/2023, 2:22 PM  Clinical Narrative:   PT eval completed, recommendation for short term rehab/SNF. Met with pt and son at bedside, patient reports she would like to think about it and discuss with her family, agreeable to NCM returning tomorrow for her decision. TOC will continue to follow.     Expected Discharge Plan: Home w Home Health Services Barriers to Discharge: Continued Medical Work up  Expected Discharge Plan and Services       Living arrangements for the past 2 months: Single Family Home                                       Social Determinants of Health (SDOH) Interventions SDOH Screenings   Food Insecurity: No Food Insecurity (04/17/2023)  Housing: Low Risk  (04/17/2023)  Transportation Needs: No Transportation Needs (04/17/2023)  Utilities: Not At Risk (04/17/2023)  Alcohol Screen: Low Risk  (01/15/2022)  Depression (PHQ2-9): Low Risk  (06/19/2021)  Financial Resource Strain: Low Risk  (01/15/2022)  Social Connections: Unknown (05/21/2021)   Received from [facility], [facility]  Tobacco Use: Low Risk  (04/15/2023)    Readmission Risk Interventions     No data to display

## 2023-04-19 ENCOUNTER — Encounter (HOSPITAL_COMMUNITY): Payer: Self-pay | Admitting: Orthopaedic Surgery

## 2023-04-19 DIAGNOSIS — I519 Heart disease, unspecified: Secondary | ICD-10-CM | POA: Diagnosis not present

## 2023-04-19 DIAGNOSIS — I82402 Acute embolism and thrombosis of unspecified deep veins of left lower extremity: Secondary | ICD-10-CM | POA: Diagnosis not present

## 2023-04-19 DIAGNOSIS — I2510 Atherosclerotic heart disease of native coronary artery without angina pectoris: Secondary | ICD-10-CM | POA: Diagnosis not present

## 2023-04-19 DIAGNOSIS — S82891D Other fracture of right lower leg, subsequent encounter for closed fracture with routine healing: Secondary | ICD-10-CM | POA: Diagnosis not present

## 2023-04-19 LAB — CBC
HCT: 40.4 % (ref 36.0–46.0)
Hemoglobin: 12.4 g/dL (ref 12.0–15.0)
MCH: 29.4 pg (ref 26.0–34.0)
MCHC: 30.7 g/dL (ref 30.0–36.0)
MCV: 95.7 fL (ref 80.0–100.0)
Platelets: 104 10*3/uL — ABNORMAL LOW (ref 150–400)
RBC: 4.22 MIL/uL (ref 3.87–5.11)
RDW: 15.4 % (ref 11.5–15.5)
WBC: 6.6 10*3/uL (ref 4.0–10.5)
nRBC: 0 % (ref 0.0–0.2)

## 2023-04-19 LAB — COMPREHENSIVE METABOLIC PANEL WITH GFR
ALT: 22 U/L (ref 0–44)
AST: 22 U/L (ref 15–41)
Albumin: 3 g/dL — ABNORMAL LOW (ref 3.5–5.0)
Alkaline Phosphatase: 58 U/L (ref 38–126)
Anion gap: 7 (ref 5–15)
BUN: 21 mg/dL (ref 8–23)
CO2: 23 mmol/L (ref 22–32)
Calcium: 8.8 mg/dL — ABNORMAL LOW (ref 8.9–10.3)
Chloride: 105 mmol/L (ref 98–111)
Creatinine, Ser: 1.01 mg/dL — ABNORMAL HIGH (ref 0.44–1.00)
GFR, Estimated: 59 mL/min — ABNORMAL LOW (ref >60)
Glucose, Bld: 164 mg/dL — ABNORMAL HIGH (ref 70–99)
Potassium: 4.4 mmol/L (ref 3.5–5.1)
Sodium: 135 mmol/L (ref 135–145)
Total Bilirubin: 1 mg/dL (ref 0.0–1.2)
Total Protein: 6.5 g/dL (ref 6.5–8.1)

## 2023-04-19 LAB — GLUCOSE, CAPILLARY
Glucose-Capillary: 162 mg/dL — ABNORMAL HIGH (ref 70–99)
Glucose-Capillary: 175 mg/dL — ABNORMAL HIGH (ref 70–99)
Glucose-Capillary: 204 mg/dL — ABNORMAL HIGH (ref 70–99)
Glucose-Capillary: 177 mg/dL — ABNORMAL HIGH (ref 70–99)

## 2023-04-19 LAB — MAGNESIUM: Magnesium: 2.1 mg/dL (ref 1.7–2.4)

## 2023-04-19 MED ORDER — POLYETHYLENE GLYCOL 3350 17 G PO PACK
17.0000 g | PACK | Freq: Every day | ORAL | Status: DC
  Administered 2023-04-19 – 2023-04-23 (×5): 17 g via ORAL
  Filled 2023-04-19 (×5): qty 1

## 2023-04-19 MED ORDER — ENOXAPARIN SODIUM 40 MG/0.4ML IJ SOSY
40.0000 mg | PREFILLED_SYRINGE | INTRAMUSCULAR | Status: AC
  Administered 2023-04-19 – 2023-04-23 (×5): 40 mg via SUBCUTANEOUS
  Filled 2023-04-19 (×5): qty 0.4

## 2023-04-19 MED ORDER — SENNOSIDES-DOCUSATE SODIUM 8.6-50 MG PO TABS
2.0000 | ORAL_TABLET | Freq: Two times a day (BID) | ORAL | Status: DC
  Administered 2023-04-19 – 2023-04-23 (×7): 2 via ORAL
  Filled 2023-04-19 (×8): qty 2

## 2023-04-19 MED ORDER — APIXABAN 5 MG PO TABS: 5.0000 mg | ORAL_TABLET | Freq: Two times a day (BID) | ORAL | Status: DC

## 2023-04-19 NOTE — Progress Notes (Signed)
 Physical Therapy Treatment Patient Details Name: [name] MRN: [id number] DOB: 1950-03-21 Today's Date: 04/19/2023   History of Present Illness 73 year old female with chronic combined systolic CHF, defibrillator in place, CAD status post CABG, DM2, PAD, history of DVT comes in with a mechanical fall while entering her truck.  She was found to have a right ankle fracture, orthopedic surgery was consulted and she was admitted to the hospital. Pt now s/p ORIF right distal tibia pilon with internal fixation of distal fibula, ORIF right ankle syndesmosis    PT Comments  Pt assisted with bed mobility and again requiring total assist +2.  Pt limited by weakness and increased R leg pain especially with dependent position.  Pt only able to sit EOB for approx 3 minutes.  Pt did not feel able to stand with left LE (does not feel Lt LE is strong enough) so notified pt that next steps would be to attempt scoot/lateral transfer however pain still significantly limiting for pt at this time.  Pt reports she is still considering d/c plan.  Continue to recommend SNF.      If plan is discharge home, recommend the following: Two people to help with walking and/or transfers;A lot of help with bathing/dressing/bathroom;Assistance with cooking/housework;Assist for transportation;Help with stairs or ramp for entrance   Can travel by private vehicle     No  Equipment Recommendations  Wheelchair (measurements PT);Hospital bed    Recommendations for Other Services       Precautions / Restrictions Precautions Precautions: Fall Recall of Precautions/Restrictions: Intact Required Braces or Orthoses: Splint/Cast Splint/Cast: R LE/ankle Restrictions Weight Bearing Restrictions Per Provider Order: Yes RLE Weight Bearing Per Provider Order: Non weight bearing     Mobility  Bed Mobility Overal bed mobility: Needs Assistance Bed Mobility: Supine to Sit, Sit to Supine     Supine to sit: Total assist, +2  for physical assistance Sit to supine: Total assist, +2 for physical assistance   General bed mobility comments: pt required assist for trunk elevation and B LE mgt to EOB/off EOB with R LE supported. pt required increased time and effort to complete. Pt limited by pain/anticipatory pain, also crying with attempt to lower foot towards floor so maintained slightly elevated R lower leg/support    Transfers                   General transfer comment: unable to attempt at this time    Ambulation/Gait                   Stairs             Wheelchair Mobility     Tilt Bed    Modified Rankin (Stroke Patients Only)       Balance Overall balance assessment: Needs assistance Sitting-balance support: Feet supported, No upper extremity supported Sitting balance-Leahy Scale: Fair                                      Hotel manager: No apparent difficulties  Cognition Arousal: Alert Behavior During Therapy: WFL for tasks assessed/performed   PT - Cognitive impairments: Memory                                Cueing Cueing Techniques: Verbal cues  Exercises      General Comments  Pertinent Vitals/Pain Pain Assessment Pain Assessment: 0-10 Pain Score: 10-Worst pain ever Pain Location: right lower leg Pain Descriptors / Indicators: Aching, Discomfort, Tender, Guarding Pain Intervention(s): Repositioned, Monitored during session, Other (comment) (reports she has been taking pain meds today, elevated with pillow end of session)    Home Living                          Prior Function            PT Goals (current goals can now be found in the care plan section) Progress towards PT goals: Progressing toward goals    Frequency    Min 3X/week      PT Plan      Co-evaluation              AM-PAC PT "6 Clicks" Mobility   Outcome Measure  Help needed turning from your  back to your side while in a flat bed without using bedrails?: Total Help needed moving from lying on your back to sitting on the side of a flat bed without using bedrails?: Total Help needed moving to and from a bed to a chair (including a wheelchair)?: Total Help needed standing up from a chair using your arms (e.g., wheelchair or bedside chair)?: Total Help needed to walk in hospital room?: Total Help needed climbing 3-5 steps with a railing? : Total 6 Click Score: 6    End of Session   Activity Tolerance: Patient limited by pain Patient left: with call bell/phone within reach;in bed;with bed alarm set   PT Visit Diagnosis: Muscle weakness (generalized) (M62.81);Other abnormalities of gait and mobility (R26.89)     Time: 8295-6213 PT Time Calculation (min) (ACUTE ONLY): 16 min  Charges:    $Therapeutic Activity: 8-22 mins PT General Charges $$ ACUTE PT VISIT: 1 Visit                    Thomasene Mohair PT, DPT Physical Therapist Acute Rehabilitation Services Office: [phone]    Kati L Payson 04/19/2023, 1:19 PM

## 2023-04-19 NOTE — Anesthesia Postprocedure Evaluation (Signed)
 Anesthesia Post Note  Patient: [name]  Procedure(s) Performed: OPEN REDUCTION INTERNAL FIXATION (ORIF) ANKLE FRACTURE (Right: Ankle)     Patient location during evaluation: PACU Anesthesia Type: General Level of consciousness: awake and alert Pain management: pain level controlled Vital Signs Assessment: post-procedure vital signs reviewed and stable Respiratory status: spontaneous breathing, nonlabored ventilation, respiratory function stable and patient connected to nasal cannula oxygen Cardiovascular status: blood pressure returned to baseline and stable Postop Assessment: no apparent nausea or vomiting Anesthetic complications: no   No notable events documented.               Shelton Silvas

## 2023-04-19 NOTE — [place] FL2 (Addendum)
 [place] MEDICAID FL2 LEVEL OF CARE FORM     IDENTIFICATION  Patient Name: [name] Birthdate: Jul 24, 1950 Sex: female Admission Date (Current Location): 04/15/2023  [facility] and [place] Number:  Producer, television/film/video and Address:  [facility],  [address] [place], [place] [address]      Provider Number:    Attending Physician Name and Address:  Leatha Gilding, MD  Relative Name and Phone Number:  Rudd,[name] (Daughter)  [phone] (Mobile)    Current Level of Care: Hospital Recommended Level of Care: Skilled Nursing Facility Prior Approval Number:    Date Approved/Denied:   PASRR Number: [id number]  Discharge Plan: SNF    Current Diagnoses: Patient Active Problem List   Diagnosis Date Noted   Hypokalemia 04/16/2023   Ankle fracture 04/15/2023   Pacemaker 12/28/2022   Depressed left ventricular ejection fraction 04/30/2022   Acute cystitis 04/27/2022   Anxiety and depression 04/27/2022   Ventricular tachyarrhythmia (HCC) 01/12/2022   Pain in left foot 11/15/2021   Acute metabolic acidosis 10/31/2021   Vaginal bleeding, abnormal 10/30/2021   Fibroid uterus 10/30/2021   Chronic hypoxemic respiratory failure (HCC) 10/30/2021   Acute kidney injury (HCC) 10/29/2021   Nausea, vomiting, and diarrhea 08/16/2021   COVID-19 virus infection 08/16/2021   History of DVT (deep vein thrombosis) 08/16/2021   QT prolongation 08/16/2021   Right ovarian cyst 08/16/2021   Elevated AST (SGOT) 08/16/2021   Preop cardiovascular exam 02/10/2021   Adnexal mass 12/23/2020   Polyneuropathy associated with underlying disease (HCC) 09/01/2020   Acute pansinusitis 03/18/2020   Diabetic peripheral neuropathy (HCC) 03/18/2020   Urine finding 03/18/2020   Aortic atherosclerosis (HCC) 01/13/2020   Diabetes mellitus with coincident hypertension (HCC) 01/13/2020   Type 2 diabetes mellitus with diabetic polyneuropathy, with long-term current use of insulin (HCC)  07/16/2019   Type 2 diabetes mellitus with hyperglycemia, with long-term current use of insulin (HCC) 04/20/2019   Diabetes mellitus (HCC) 04/20/2019   Uncontrolled diabetes mellitus 02/20/2019   Pressure injury of skin 02/20/2019   Demand ischemia (HCC)    Abnormal LFTs    Acute pancreatitis 01/12/2019   DKA (diabetic ketoacidosis) (HCC) 01/12/2019   Vitamin D deficiency 11/05/2018   Vitamin B 12 deficiency 11/05/2018   Osteoarthritis of knee 07/02/2018   Tinea    Sleep apnea    Oxygen dependent    Obesity    Insomnia    Hyperlipidemia    Dermal mycosis    Depression    Coronary artery disease    Arthritis    Anxiety    ICD (implantable cardioverter-defibrillator) in place    Heme positive stool    Benign neoplasm of transverse colon    Bundle branch block 05/08/2016   Left leg pain    DVT (deep venous thrombosis) (HCC) 09/30/2015   Syncope 03/11/2015   AKI (acute kidney injury) (HCC) 03/11/2015   Hypotension 03/11/2015   Syncope and collapse 03/11/2015   Ventricular tachycardia (HCC) 01/17/2015   Pain in the chest    SOB (shortness of breath)    Thyroid nodule    CAP (community acquired pneumonia)    Chest pain 05/26/2014   Right flank pain 05/26/2014   Right flank discomfort    Chronic combined systolic and diastolic heart failure (HCC)    Angina pectoris (HCC)    Morbid obesity (HCC) 11/06/2012   Angina decubitus (HCC) 11/06/2012   LBBB (left bundle branch block) 09/24/2012   Chronic systolic dysfunction of left ventricle 09/24/2012  Hypoglycemia 09/09/2012   HLD (hyperlipidemia) 04/23/2012   Insulin dependent diabetes mellitus (HCC) 04/23/2012   Acute kidney injury superimposed on chronic kidney disease (HCC) 03/10/2012   Ischemic cardiomyopathy 06/18/2010   CAD (coronary artery disease) 06/18/2010   Hypertension 06/18/2010   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    Orientation RESPIRATION BLADDER Height & Weight     Self, Time, Situation, Place  O2 Incontinent,  External catheter Weight: 104 kg Height:  5\' 3"  (160 cm)  BEHAVIORAL SYMPTOMS/MOOD NEUROLOGICAL BOWEL NUTRITION STATUS      Continent Diet (carb modified)  AMBULATORY STATUS COMMUNICATION OF NEEDS Skin   Extensive Assist Verbally Surgical wounds (Right Ankle Incision)                       Personal Care Assistance Level of Assistance  Bathing, Feeding, Dressing Bathing Assistance: Limited assistance Feeding assistance: Limited assistance Dressing Assistance: Limited assistance     Functional Limitations Info  Sight, Hearing, Speech Sight Info: Impaired (eyeglasses) Hearing Info: Adequate Speech Info: Adequate    SPECIAL CARE FACTORS FREQUENCY  PT (By licensed PT), OT (By licensed OT)     PT Frequency: 5x/wk OT Frequency: 5x/wk            Contractures Contractures Info: Not present    Additional Factors Info  Code Status, Allergies, Psychotropic Code Status Info: Full Code Allergies Info: Metformin And Related, Ozempic (0.25 Or 0.5 Mg-dose) (Semaglutide(0.25 Or 0.5mg -dos)), Atorvastatin Calcium, Levofloxacin Psychotropic Info: N/A         Current Medications (04/19/2023):  This is the current hospital active medication list Current Facility-Administered Medications  Medication Dose Route Frequency Provider Last Rate Last Admin   amiodarone (PACERONE) tablet 100 mg  100 mg Oral Daily Eduard Clos, MD   100 mg at 04/18/23 0834   [START ON 04/24/2023] apixaban (ELIQUIS) tablet 5 mg  5 mg Oral BID Leatha Gilding, MD       calcitonin (salmon) (MIACALCIN/FORTICAL) nasal spray 1 spray  1 spray Alternating Nares Daily Eduard Clos, MD   1 spray at 04/18/23 0840   dapagliflozin propanediol (FARXIGA) tablet 10 mg  10 mg Oral Daily Eduard Clos, MD   10 mg at 04/18/23 0834   enoxaparin (LOVENOX) injection 40 mg  40 mg Subcutaneous Q24H Leatha Gilding, MD       ezetimibe (ZETIA) tablet 10 mg  10 mg Oral Daily Eduard Clos, MD   10 mg at  04/18/23 4098   ferrous sulfate tablet 325 mg  325 mg Oral Q breakfast Eduard Clos, MD   325 mg at 04/18/23 1191   gabapentin (NEURONTIN) capsule 300 mg  300 mg Oral TID Eduard Clos, MD   300 mg at 04/18/23 2106   insulin aspart (novoLOG) injection 0-9 Units  0-9 Units Subcutaneous TID WC Eduard Clos, MD   2 Units at 04/18/23 1658   insulin glargine-yfgn (SEMGLEE) injection 30 Units  30 Units Subcutaneous Daily Eduard Clos, MD   30 Units at 04/18/23 0836   latanoprost (XALATAN) 0.005 % ophthalmic solution 1 drop  1 drop Both Eyes QHS Eduard Clos, MD   1 drop at 04/18/23 2106   nitroGLYCERIN (NITROSTAT) SL tablet 0.4 mg  0.4 mg Sublingual Q5 min PRN Eduard Clos, MD       oxyCODONE (Oxy IR/ROXICODONE) immediate release tablet 5 mg  5 mg Oral Q6H PRN Eduard Clos, MD   5 mg  at 04/19/23 0308   rosuvastatin (CRESTOR) tablet 20 mg  20 mg Oral q1800 Eduard Clos, MD   20 mg at 04/18/23 1601   sacubitril-valsartan (ENTRESTO) 24-26 mg per tablet  1 tablet Oral BID Leatha Gilding, MD   1 tablet at 04/18/23 2106     Discharge Medications: Please see discharge summary for a list of discharge medications.  Relevant Imaging Results:  Relevant Lab Results:   Additional Information SSN: [id number]  Howell Rucks, RN

## 2023-04-19 NOTE — Progress Notes (Signed)
   04/19/23 1944  BiPAP/CPAP/SIPAP  BiPAP/CPAP/SIPAP Pt Type Adult  Reason BIPAP/CPAP not in use Non-compliant

## 2023-04-19 NOTE — Progress Notes (Signed)
 PROGRESS NOTE  [name] VWU:[id number] DOB: 1950/07/27 DOA: 04/15/2023 PCP: Elie Confer, NP   LOS: 2 days   Brief Narrative / Interim history: 73 year old female with chronic combined systolic CHF, defibrillator in place, CAD status post CABG, DM2, PAD, history of DVT comes in with a mechanical fall while entering her truck.  She was found to have a right ankle fracture, orthopedic surgery was consulted and she was admitted to the hospital.  Subjective / 24h Interval events: No chest pain, no shortness of breath.  Complains of foot pain  Assesement and Plan: Principal Problem:   Ankle fracture Active Problems:   CAD (coronary artery disease)   Hypertension   Chronic systolic dysfunction of left ventricle   S/P CABG x 5   DVT (deep venous thrombosis) (HCC)   Sleep apnea   Type 2 diabetes mellitus with hyperglycemia, with long-term current use of insulin (HCC)   Hypokalemia  Principal problem Right ankle fracture-following mechanical fall, orthopedic surgery consulted, status post ORIF by Dr. Ali Lowe 4/9.  PT consult postoperatively, SNF recommended, she was initially hesitant but now agreeable.  TOC consulted  Active problems Chronic combined CHF, implantable cardioverter defibrillator in place, history of VT -continue to monitor on telemetry.  She has slight lower extremity edema but appears chronic.  Follows with cardiology as an outpatient -Most recent 2D echo done in July 2024 shows LVEF 2530%, global hypokinesis  -At baseline cardiac status prior to the fall -Continue home medications  PAF-continue amiodarone.  Discussed with orthopedic surgery, preference is to hold anticoagulation for a week postoperatively, but could be resumed on postoperative day 2.  Discussed with the patient this morning at bedside, risks/benefits, she prefers to hold anticoagulation for the full week -Start Lovenox for DVT prophylaxis for the next 5 days, resume Eliquis on postop day  7  Hyperlipidemia-continue home medications  Hypokalemia-continue to monitor  History of CAD status post CABG-no chest pain, continue home medications  History of DVT-hold anticoagulation perioperatively, now on Lovenox for DVT prophylaxis  PAD-outpatient follow-up with vascular  Insulin-dependent diabetes mellitus, with hyperglycemia-continue insulin  Lab Results  Component Value Date   HGBA1C 7.3 (H) 04/16/2023   CBG (last 3)  Recent Labs    04/18/23 1629 04/18/23 2129 04/19/23 0737  GLUCAP 194* 212* 162*    Scheduled Meds:  amiodarone  100 mg Oral Daily   [START ON 04/24/2023] apixaban  5 mg Oral BID   calcitonin (salmon)  1 spray Alternating Nares Daily   dapagliflozin propanediol  10 mg Oral Daily   enoxaparin (LOVENOX) injection  40 mg Subcutaneous Q24H   ezetimibe  10 mg Oral Daily   ferrous sulfate  325 mg Oral Q breakfast   gabapentin  300 mg Oral TID   insulin aspart  0-9 Units Subcutaneous TID WC   insulin glargine-yfgn  30 Units Subcutaneous Daily   latanoprost  1 drop Both Eyes QHS   rosuvastatin  20 mg Oral q1800   sacubitril-valsartan  1 tablet Oral BID   Continuous Infusions: PRN Meds:.nitroGLYCERIN, oxyCODONE  Current Outpatient Medications  Medication Instructions   albuterol (VENTOLIN HFA) 108 (90 Base) MCG/ACT inhaler 2 puffs, 2 times daily PRN   Alcohol Swabs (DROPSAFE ALCOHOL PREP) 70 % PADS Apply topically.   amiodarone (PACERONE) 100 mg, Oral, Daily   calcitonin, salmon, (MIACALCIN/FORTICAL) 200 UNIT/ACT nasal spray 1 spray, Alternating Nares, Daily   Continuous Glucose Sensor (DEXCOM G7 SENSOR) MISC 1 Device, Does not apply, As directed   cyanocobalamin (  VITAMIN B12) 1,000 mcg, Intramuscular, Every 30 days   Eliquis 5 mg, Oral, 2 times daily   ENTRESTO 24-26 MG 1 tablet, Oral, 2 times daily   ezetimibe (ZETIA) 10 mg, Oral, Daily   Farxiga 10 mg, Oral, Daily   ferrous sulfate 325 mg, Oral, Daily with breakfast   gabapentin (NEURONTIN)  300 mg, Oral, 3 times daily   glucose blood (ACCU-CHEK GUIDE) test strip Check 3 times daily   Insulin Pen Needle 31G X 5 MM MISC 1 Device, Does not apply, 4 times daily   ketoconazole (NIZORAL) 2 % cream 1 Application, Daily PRN   latanoprost (XALATAN) 0.005 % ophthalmic solution 1 drop, Daily at bedtime   nitroGLYCERIN (NITROSTAT) 0.4 mg, Sublingual, Every 5 min PRN   NOVOLOG FLEXPEN 100 UNIT/ML FlexPen INJECT PER SLIDING SCALE AS DIRECTED , MAX 30 UNITS PER DAY. DISCARD PEN 28 DAYS AFTER OPENING   OXYGEN 2 L, Inhalation, Continuous   rosuvastatin (CRESTOR) 20 mg, Oral, Daily-1800   Tresiba FlexTouch 30 Units, Subcutaneous, Daily   TRUE METRIX BLOOD GLUCOSE TEST test strip 3 times daily, for testing   Vitamin D (Ergocalciferol) (DRISDOL) 50,000 Units, Oral, Every 7 days    Diet Orders (From admission, onward)     Start     Ordered   04/17/23 1823  Diet Carb Modified Fluid consistency: Thin; Room service appropriate? Yes  Diet effective now       Question Answer Comment  Diet-HS Snack? Nothing   Calorie Level Medium 1600-2000   Fluid consistency: Thin   Room service appropriate? Yes      04/17/23 1822            DVT prophylaxis: enoxaparin (LOVENOX) injection 40 mg Start: 04/19/23 1100 apixaban (ELIQUIS) tablet 5 mg   Lab Results  Component Value Date   PLT 104 (L) 04/19/2023      Code Status: Full Code  Family Communication: Daughter present at bedside  Status is: Inpatient  Level of care: Telemetry  Consultants:  Orthopedic surgery  Objective: Vitals:   04/18/23 1344 04/18/23 1806 04/18/23 2135 04/19/23 0522  BP: (!) 150/62 (!) 165/60 (!) 158/63 (!) 134/54  Pulse: 63 64 63 62  Resp: 15 16 18 18   Temp: 99.7 F (37.6 C) 99.2 F (37.3 C) 99.4 F (37.4 C) 98.5 F (36.9 C)  TempSrc: Oral  Oral Oral  SpO2: 100% 92% 99% 98%  Weight:      Height:        Intake/Output Summary (Last 24 hours) at 04/19/2023 1018 Last data filed at 04/19/2023 0801 Gross per  24 hour  Intake 730 ml  Output 2350 ml  Net -1620 ml   Wt Readings from Last 3 Encounters:  04/17/23 104 kg  04/03/23 104.3 kg  03/15/23 110.3 kg    Examination:  Constitutional: NAD Eyes: lids and conjunctivae normal, no scleral icterus ENMT: mmm Neck: normal, supple Respiratory: clear to auscultation bilaterally, no wheezing, no crackles. Normal respiratory effort.  Cardiovascular: Regular rate and rhythm, no murmurs / rubs / gallops. No LE edema. Abdomen: soft, no distention, no tenderness. Bowel sounds positive.    Data Reviewed: I have independently reviewed following labs and imaging studies   CBC Recent Labs  Lab 04/15/23 1810 04/16/23 0526 04/17/23 0333 04/18/23 0327 04/19/23 0320  WBC 5.3 5.9 6.0 6.5 6.6  HGB 14.6 14.1 13.0 12.7 12.4  HCT 46.2* 45.6 42.6 40.6 40.4  PLT 119* 117* 113* 105* 104*  MCV 93.7 95.8 96.8 95.5 95.7  MCH 29.6 29.6 29.5 29.9 29.4  MCHC 31.6 30.9 30.5 31.3 30.7  RDW 15.0 15.0 15.2 15.4 15.4    Recent Labs  Lab 04/15/23 1810 04/16/23 0526 04/17/23 0333 04/18/23 0327 04/19/23 0320  NA 144 140 134* 137 135  K 3.1* 4.0 4.7 4.3 4.4  CL 117* 109 103 105 105  CO2 18* 25 24 26 23   GLUCOSE 124* 156* 137* 139* 164*  BUN 19 21 18 20 21   CREATININE 0.79 0.82 1.14* 1.12* 1.01*  CALCIUM 7.2* 9.3 8.8* 8.9 8.8*  AST  --   --   --  25 22  ALT  --   --   --  31 22  ALKPHOS  --   --   --  62 58  BILITOT  --   --   --  0.7 1.0  ALBUMIN  --   --   --  3.0* 3.0*  MG  --   --   --  2.1 2.1  HGBA1C  --  7.3*  --   --   --     ------------------------------------------------------------------------------------------------------------------ No results for input(s): "CHOL", "HDL", "LDLCALC", "TRIG", "CHOLHDL", "LDLDIRECT" in the last 72 hours.  Lab Results  Component Value Date   HGBA1C 7.3 (H) 04/16/2023   ------------------------------------------------------------------------------------------------------------------ No results for  input(s): "TSH", "T4TOTAL", "T3FREE", "THYROIDAB" in the last 72 hours.  Invalid input(s): "FREET3"  Cardiac Enzymes No results for input(s): "CKMB", "TROPONINI", "MYOGLOBIN" in the last 168 hours.  Invalid input(s): "CK" ------------------------------------------------------------------------------------------------------------------    Component Value Date/Time   BNP 81.7 04/27/2022 1511   BNP 73.5 03/21/2015 1504    CBG: Recent Labs  Lab 04/18/23 0743 04/18/23 1120 04/18/23 1629 04/18/23 2129 04/19/23 0737  GLUCAP 158* 190* 194* 212* 162*    Recent Results (from the past 240 hours)  Surgical pcr screen     Status: None   Collection Time: 04/16/23 10:13 PM   Specimen: Nasal Mucosa; Nasal Swab  Result Value Ref Range Status   MRSA, PCR NEGATIVE NEGATIVE Final   Staphylococcus aureus NEGATIVE NEGATIVE Final    Comment: (NOTE) The Xpert SA Assay (FDA approved for NASAL specimens in patients 30 years of age and older), is one component of a comprehensive surveillance program. It is not intended to diagnose infection nor to guide or monitor treatment. Performed at [facility], [address], [place], [place] [address]      Radiology Studies: No results found.    Pamella Pert, MD, PhD [facility]  Between 7 am - 7 pm I am available, please contact me via Amion (for emergencies) or Securechat (non urgent messages)  Between 7 pm - 7 am I am not available, please contact night coverage MD/APP via Amion

## 2023-04-19 NOTE — TOC Progression Note (Addendum)
 Transition of Care [facility]) - Progression Note    Patient Details  Name: [name] MRN: [id number] Date of Birth: Oct 15, 1950  [facility] CM/SW Contact  Howell Rucks, RN Phone Number: 04/19/2023, 10:12 AM  Clinical Narrative:  Met with patient at bedside to review PT recommendation for short term rehab/SNF, pt reports she hasn't discussed with her family yet but is leaning towards going to SNF, agreeable to NCM starting SNF process. FL2 updated, Level 2 PASRR pending  faxed out for bed offers.   -10:18pm Call to pt's dtr, [name], left vm with update on PT recommendation for short term rehab/SNF,  pt agreeable to NCM starting process , faxed out for bed offers, await list of bed offers to present to patient/family.   -3:35pm Met with patient at bedside to review short term rehab bed offers [facility], [facility], [place], [place], [place], [place], [place]), pt drowsy. NCM called to pt's dtr, [name], to review short term rehab bed offers, reports she was at work bu planned on visiting patient later today, informed short term rehab bed offer list left at bedside for family/patient to review,  informed CM will follow up tomorrow for facility choice, voiced understanding.   -3:48pm PASRR [id number]  Expected Discharge Plan: Home w Home Health Services Barriers to Discharge: Continued Medical Work up  Expected Discharge Plan and Services       Living arrangements for the past 2 months: Single Family Home                                       Social Determinants of Health (SDOH) Interventions SDOH Screenings   Food Insecurity: No Food Insecurity (04/17/2023)  Housing: Low Risk  (04/17/2023)  Transportation Needs: No Transportation Needs (04/17/2023)  Utilities: Not At Risk (04/17/2023)  Alcohol Screen: Low Risk  (01/15/2022)  Depression (PHQ2-9): Low Risk  (06/19/2021)  Financial Resource Strain: Low Risk  (01/15/2022)  Social  Connections: Unknown (05/21/2021)   Received from [facility]  Tobacco Use: Low Risk  (04/15/2023)    Readmission Risk Interventions     No data to display

## 2023-04-19 NOTE — TOC PASRR Note (Signed)
 30 Day PASRR Note   Patient Details  Name: [name] Date of Birth: 12-Nov-1950   Transition of Care [facility]) CM/SW Contact:    Howell Rucks, RN Phone Number: 04/19/2023, 10:11 AM  To Whom It May Concern:  Please be advised that this patient will require a short-term nursing home stay - anticipated 30 days or less for rehabilitation and strengthening.   The plan is for return home.

## 2023-04-20 ENCOUNTER — Other Ambulatory Visit: Payer: Self-pay | Admitting: Internal Medicine

## 2023-04-20 DIAGNOSIS — I519 Heart disease, unspecified: Secondary | ICD-10-CM | POA: Diagnosis not present

## 2023-04-20 DIAGNOSIS — I2510 Atherosclerotic heart disease of native coronary artery without angina pectoris: Secondary | ICD-10-CM | POA: Diagnosis not present

## 2023-04-20 DIAGNOSIS — I82402 Acute embolism and thrombosis of unspecified deep veins of left lower extremity: Secondary | ICD-10-CM | POA: Diagnosis not present

## 2023-04-20 DIAGNOSIS — S82891D Other fracture of right lower leg, subsequent encounter for closed fracture with routine healing: Secondary | ICD-10-CM | POA: Diagnosis not present

## 2023-04-20 LAB — GLUCOSE, CAPILLARY
Glucose-Capillary: 143 mg/dL — ABNORMAL HIGH (ref 70–99)
Glucose-Capillary: 171 mg/dL — ABNORMAL HIGH (ref 70–99)
Glucose-Capillary: 225 mg/dL — ABNORMAL HIGH (ref 70–99)
Glucose-Capillary: 211 mg/dL — ABNORMAL HIGH (ref 70–99)

## 2023-04-20 LAB — COMPREHENSIVE METABOLIC PANEL WITH GFR
ALT: 22 U/L (ref 0–44)
AST: 23 U/L (ref 15–41)
Albumin: 2.9 g/dL — ABNORMAL LOW (ref 3.5–5.0)
Alkaline Phosphatase: 53 U/L (ref 38–126)
Anion gap: 7 (ref 5–15)
BUN: 25 mg/dL — ABNORMAL HIGH (ref 8–23)
CO2: 27 mmol/L (ref 22–32)
Calcium: 9.3 mg/dL (ref 8.9–10.3)
Chloride: 103 mmol/L (ref 98–111)
Creatinine, Ser: 1.02 mg/dL — ABNORMAL HIGH (ref 0.44–1.00)
GFR, Estimated: 58 mL/min — ABNORMAL LOW (ref >60)
Glucose, Bld: 151 mg/dL — ABNORMAL HIGH (ref 70–99)
Potassium: 4.6 mmol/L (ref 3.5–5.1)
Sodium: 137 mmol/L (ref 135–145)
Total Bilirubin: 1 mg/dL (ref 0.0–1.2)
Total Protein: 6.5 g/dL (ref 6.5–8.1)

## 2023-04-20 LAB — CBC
HCT: 38.5 % (ref 36.0–46.0)
Hemoglobin: 12.1 g/dL (ref 12.0–15.0)
MCH: 29.9 pg (ref 26.0–34.0)
MCHC: 31.4 g/dL (ref 30.0–36.0)
MCV: 95.1 fL (ref 80.0–100.0)
Platelets: 111 10*3/uL — ABNORMAL LOW (ref 150–400)
RBC: 4.05 MIL/uL (ref 3.87–5.11)
RDW: 15.4 % (ref 11.5–15.5)
WBC: 7.2 10*3/uL (ref 4.0–10.5)
nRBC: 0 % (ref 0.0–0.2)

## 2023-04-20 LAB — MAGNESIUM: Magnesium: 2 mg/dL (ref 1.7–2.4)

## 2023-04-20 MED ORDER — OXYCODONE HCL 5 MG PO TABS
5.0000 mg | ORAL_TABLET | ORAL | Status: DC | PRN
  Administered 2023-04-20 (×2): 5 mg via ORAL
  Administered 2023-04-20 – 2023-04-21 (×3): 10 mg via ORAL
  Administered 2023-04-21 – 2023-04-22 (×2): 5 mg via ORAL
  Administered 2023-04-22 – 2023-04-23 (×5): 10 mg via ORAL
  Filled 2023-04-20 (×10): qty 2
  Filled 2023-04-20 (×2): qty 1
  Filled 2023-04-20: qty 2

## 2023-04-20 NOTE — Progress Notes (Signed)
 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  She was found to have a right ankle fracture, orthopedic surgery was consulted and she was admitted to the hospital.  Subjective / 24h Interval events: She feels well except for foot pain, no chest pain, no shortness of breath.  Asking whether pain medications can be increased  Assesement and Plan: Principal Problem:   Ankle fracture Active Problems:   CAD (coronary artery disease)   Hypertension   Chronic systolic dysfunction of left ventricle   S/P CABG x 5   DVT (deep venous thrombosis) (HCC)   Sleep apnea   Type 2 diabetes mellitus with hyperglycemia, with long-term current use of insulin (HCC)   Hypokalemia  Principal problem Right ankle fracture-following mechanical fall, orthopedic surgery consulted, status post ORIF by Dr. Jeryl Moris 4/9.  PT consult postoperatively, SNF recommended, patient tells me this morning that she wants to go home.  Discussed with her and daughter at bedside that I do not think this is a good idea given nonweightbearing status.  She will discussed furthermore with the family.  She is aware that medically she is stable  Active problems Chronic combined CHF, implantable cardioverter defibrillator in place, history of VT -continue to monitor on telemetry.  She has slight lower extremity edema but appears chronic.  Follows with cardiology as an outpatient -Most recent 2D echo done in July 2024 shows LVEF 25-30%, global hypokinesis  -At baseline cardiac status prior to the fall -Continue home medications, euvolemic today and otherwise at baseline  PAF-continue amiodarone.  Discussed with orthopedic surgery, preference  is to hold anticoagulation for a week postoperatively, but could be resumed on postoperative day 2.  Discussed with the patient and daughter at bedside, risks/benefits, she prefers to hold anticoagulation for the full week -Start Lovenox for DVT prophylaxis for the next 4 days, resume Eliquis on postop day 7  Hyperlipidemia-continue home medications  Hypokalemia-continue to monitor  History of CAD status post CABG-no chest pain, continue home medications  History of DVT-hold anticoagulation perioperatively, now on Lovenox for DVT prophylaxis  PAD-outpatient follow-up with vascular  Insulin-dependent diabetes mellitus, with hyperglycemia-continue insulin  Lab Results  Component Value Date   HGBA1C 7.3 (H) 04/16/2023   CBG (last 3)  Recent Labs    04/19/23 1645 04/19/23 2234 04/20/23 0723  GLUCAP 204* 177* 143*    Scheduled Meds:  amiodarone  100 mg Oral Daily   [START ON 04/24/2023] apixaban  5 mg Oral BID   calcitonin (salmon)  1 spray Alternating Nares Daily   dapagliflozin propanediol  10 mg Oral Daily   enoxaparin (LOVENOX) injection  40 mg Subcutaneous Q24H   ezetimibe  10 mg Oral Daily   ferrous sulfate  325 mg Oral Q breakfast   gabapentin  300 mg Oral TID   insulin aspart  0-9 Units Subcutaneous TID WC   insulin glargine-yfgn  30 Units Subcutaneous Daily   latanoprost  1 drop Both Eyes QHS   polyethylene glycol  17 g Oral Daily   rosuvastatin  20 mg Oral q1800   sacubitril-valsartan  1 tablet Oral BID   senna-docusate  2 tablet Oral BID   Continuous Infusions: PRN Meds:.nitroGLYCERIN, oxyCODONE  Current Outpatient Medications  Medication Instructions   albuterol (VENTOLIN HFA) 108 (90 Base) MCG/ACT inhaler 2 puffs, 2 times daily PRN   Alcohol Swabs (DROPSAFE ALCOHOL PREP) 70 % PADS Apply topically.   amiodarone (PACERONE) 100 mg, Oral, Daily   calcitonin, salmon, (MIACALCIN/FORTICAL) 200 UNIT/ACT nasal spray 1 spray, Alternating Nares, Daily   Continuous  Glucose Sensor (DEXCOM G7 SENSOR) MISC 1 Device, Does not apply, As directed   cyanocobalamin (VITAMIN B12) 1,000 mcg, Intramuscular, Every 30 days   Eliquis 5 mg, Oral, 2 times daily   ENTRESTO 24-26 MG 1 tablet, Oral, 2 times daily   ezetimibe (ZETIA) 10 mg, Oral, Daily   Farxiga 10 mg, Oral, Daily   ferrous sulfate 325 mg, Oral, Daily with breakfast   gabapentin (NEURONTIN) 300 mg, Oral, 3 times daily   glucose blood (ACCU-CHEK GUIDE) test strip Check 3 times daily   Insulin Pen Needle 31G X 5 MM MISC 1 Device, Does not apply, 4 times daily   ketoconazole (NIZORAL) 2 % cream 1 Application, Daily PRN   latanoprost (XALATAN) 0.005 % ophthalmic solution 1 drop, Daily at bedtime   nitroGLYCERIN (NITROSTAT) 0.4 mg, Sublingual, Every 5 min PRN   NOVOLOG FLEXPEN 100 UNIT/ML FlexPen INJECT PER SLIDING SCALE AS DIRECTED , MAX 30 UNITS PER DAY. DISCARD PEN 28 DAYS AFTER OPENING   OXYGEN 2 L, Inhalation, Continuous   rosuvastatin (CRESTOR) 20 mg, Oral, Daily-1800   Tresiba FlexTouch 30 Units, Subcutaneous, Daily   TRUE METRIX BLOOD GLUCOSE TEST test strip 3 times daily, for testing   Vitamin D (Ergocalciferol) (DRISDOL) 50,000 Units, Oral, Every 7 days    Diet Orders (From admission, onward)     Start     Ordered   04/17/23 1823  Diet Carb Modified Fluid consistency: Thin; Room service appropriate? Yes  Diet effective now       Question Answer Comment  Diet-HS Snack? Nothing   Calorie Level Medium 1600-2000   Fluid consistency: Thin   Room service appropriate? Yes      04/17/23 1822            DVT prophylaxis: enoxaparin (LOVENOX) injection 40 mg Start: 04/19/23 1100 apixaban (ELIQUIS) tablet 5 mg   Lab Results  Component Value Date   PLT 111 (L) 04/20/2023      Code Status: Full Code  Family Communication: Daughter present at bedside  Status is: Inpatient  Level of care: Telemetry  Consultants:  Orthopedic surgery  Objective: Vitals:   04/19/23 0522 04/19/23 1309  04/19/23 2232 04/20/23 0655  BP: (!) 134/54 (!) 143/63 (!) 150/60 (!) 128/59  Pulse: 62 64 63 63  Resp: 18 18 16 14   Temp: 98.5 F (36.9 C) 98.2 F (36.8 C) 99.3 F (37.4 C) 97.6 F (36.4 C)  TempSrc: Oral Oral Oral Oral  SpO2: 98% 99% 100% 95%  Weight:      Height:        Intake/Output Summary (Last 24 hours) at 04/20/2023 1121 Last data filed at 04/20/2023 1026 Gross per 24 hour  Intake 480 ml  Output 2900 ml  Net -2420 ml   Wt Readings from Last 3 Encounters:  04/17/23 104 kg  04/03/23 104.3 kg  03/15/23 110.3 kg    Examination:  Constitutional: NAD Eyes: lids and conjunctivae normal, no scleral icterus ENMT: mmm Neck: normal, supple Respiratory: clear to auscultation bilaterally, no wheezing, no crackles. Normal respiratory effort.  Cardiovascular: Regular rate and rhythm, no murmurs / rubs / gallops. No LE edema. Abdomen: soft, no distention,  no tenderness. Bowel sounds positive.    Data Reviewed: I have independently reviewed following labs and imaging studies   CBC Recent Labs  Lab 04/16/23 0526 04/17/23 0333 04/18/23 0327 04/19/23 0320 04/20/23 0315  WBC 5.9 6.0 6.5 6.6 7.2  HGB 14.1 13.0 12.7 12.4 12.1  HCT 45.6 42.6 40.6 40.4 38.5  PLT 117* 113* 105* 104* 111*  MCV 95.8 96.8 95.5 95.7 95.1  MCH 29.6 29.5 29.9 29.4 29.9  MCHC 30.9 30.5 31.3 30.7 31.4  RDW 15.0 15.2 15.4 15.4 15.4    Recent Labs  Lab 04/16/23 0526 04/17/23 0333 04/18/23 0327 04/19/23 0320 04/20/23 0315  NA 140 134* 137 135 137  K 4.0 4.7 4.3 4.4 4.6  CL 109 103 105 105 103  CO2 25 24 26 23 27   GLUCOSE 156* 137* 139* 164* 151*  BUN 21 18 20 21  25*  CREATININE 0.82 1.14* 1.12* 1.01* 1.02*  CALCIUM 9.3 8.8* 8.9 8.8* 9.3  AST  --   --  25 22 23   ALT  --   --  31 22 22   ALKPHOS  --   --  62 58 53  BILITOT  --   --  0.7 1.0 1.0  ALBUMIN  --   --  3.0* 3.0* 2.9*  MG  --   --  2.1 2.1 2.0  HGBA1C 7.3*  --   --   --   --      ------------------------------------------------------------------------------------------------------------------ No results for input(s): "CHOL", "HDL", "LDLCALC", "TRIG", "CHOLHDL", "LDLDIRECT" in the last 72 hours.  Lab Results  Component Value Date   HGBA1C 7.3 (H) 04/16/2023   ------------------------------------------------------------------------------------------------------------------ No results for input(s): "TSH", "T4TOTAL", "T3FREE", "THYROIDAB" in the last 72 hours.  Invalid input(s): "FREET3"  Cardiac Enzymes No results for input(s): "CKMB", "TROPONINI", "MYOGLOBIN" in the last 168 hours.  Invalid input(s): "CK" ------------------------------------------------------------------------------------------------------------------    Component Value Date/Time   BNP 81.7 04/27/2022 1511   BNP 73.5 03/21/2015 1504    CBG: Recent Labs  Lab 04/19/23 0737 04/19/23 1105 04/19/23 1645 04/19/23 2234 04/20/23 0723  GLUCAP 162* 175* 204* 177* 143*    Recent Results (from the past 240 hours)  Surgical pcr screen     Status: None   Collection Time: 04/16/23 10:13 PM   Specimen: Nasal Mucosa; Nasal Swab  Result Value Ref Range Status   MRSA, PCR NEGATIVE NEGATIVE Final   Staphylococcus aureus NEGATIVE NEGATIVE Final    Comment: (NOTE) The Xpert SA Assay (FDA approved for NASAL specimens in patients 40 years of age and older), is one component of a comprehensive surveillance program. It is not intended to diagnose infection nor to guide or monitor treatment. Performed at [facility], [address], [place], [place] [address]      Radiology Studies: No results found.    Kathlen Para, MD, PhD [facility]  Between 7 am - 7 pm I am available, please contact me via Amion (for emergencies) or Securechat (non urgent messages)  Between 7 pm - 7 am I am not available, please contact night coverage MD/APP via Amion

## 2023-04-20 NOTE — TOC Progression Note (Signed)
 Transition of Care [facility]) - Progression Note    Patient Details  Name: [name] MRN: [id number] Date of Birth: 1950/07/04  [facility] CM/SW Contact  Delilah Fend, LCSW Phone Number: 04/20/2023, 3:43 PM  Clinical Narrative:    Met with pt and daughter this afternoon to gather SNF bed choice, however, daughter plans to visit facilities tomorrow to make decision. Pt and daughter not happy with current bed offers overall so would like to be able to visit.   Expected Discharge Plan: Home w Home Health Services Barriers to Discharge: Continued Medical Work up  Expected Discharge Plan and Services       Living arrangements for the past 2 months: Single Family Home                                       Social Determinants of Health (SDOH) Interventions SDOH Screenings   Food Insecurity: No Food Insecurity (04/17/2023)  Housing: Low Risk  (04/17/2023)  Transportation Needs: No Transportation Needs (04/17/2023)  Utilities: Not At Risk (04/17/2023)  Alcohol Screen: Low Risk  (01/15/2022)  Depression (PHQ2-9): Low Risk  (06/19/2021)  Financial Resource Strain: Low Risk  (01/15/2022)  Social Connections: Unknown (05/21/2021)   Received from [facility]  Tobacco Use: Low Risk  (04/15/2023)    Readmission Risk Interventions     No data to display

## 2023-04-20 NOTE — Progress Notes (Signed)
   04/20/23 1929  BiPAP/CPAP/SIPAP  BiPAP/CPAP/SIPAP Pt Type Adult  Reason BIPAP/CPAP not in use Non-compliant

## 2023-04-21 DIAGNOSIS — I82402 Acute embolism and thrombosis of unspecified deep veins of left lower extremity: Secondary | ICD-10-CM | POA: Diagnosis not present

## 2023-04-21 DIAGNOSIS — I2510 Atherosclerotic heart disease of native coronary artery without angina pectoris: Secondary | ICD-10-CM | POA: Diagnosis not present

## 2023-04-21 DIAGNOSIS — I519 Heart disease, unspecified: Secondary | ICD-10-CM | POA: Diagnosis not present

## 2023-04-21 DIAGNOSIS — S82891D Other fracture of right lower leg, subsequent encounter for closed fracture with routine healing: Secondary | ICD-10-CM | POA: Diagnosis not present

## 2023-04-21 LAB — GLUCOSE, CAPILLARY
Glucose-Capillary: 182 mg/dL — ABNORMAL HIGH (ref 70–99)
Glucose-Capillary: 281 mg/dL — ABNORMAL HIGH (ref 70–99)
Glucose-Capillary: 159 mg/dL — ABNORMAL HIGH (ref 70–99)
Glucose-Capillary: 187 mg/dL — ABNORMAL HIGH (ref 70–99)

## 2023-04-21 NOTE — Progress Notes (Signed)
 PROGRESS NOTE  [name] [id number] DOB: March 19, 1950 DOA: 04/15/2023 PCP: Verlene Glimpse, NP   LOS: 4 days   Brief Narrative / Interim history: 73 year old female with chronic combined systolic CHF, defibrillator in place, CAD status post CABG, DM2, PAD, history of DVT comes in with a mechanical fall while entering her truck.  She was found to have a right ankle fracture, orthopedic surgery was consulted and she was admitted to the hospital.  Subjective / 24h Interval events: She denies any chest pain, denies any palpitations.  No shortness of breath.  Finally agreeable to go to SNF.  Daughter is at bedside and they are evaluating options and she will visit SNFs today  Assesement and Plan: Principal Problem:   Ankle fracture Active Problems:   CAD (coronary artery disease)   Hypertension   Chronic systolic dysfunction of left ventricle   S/P CABG x 5   DVT (deep venous thrombosis) (HCC)   Sleep apnea   Type 2 diabetes mellitus with hyperglycemia, with long-term current use of insulin (HCC)   Hypokalemia  Principal problem Right ankle fracture-following mechanical fall, orthopedic surgery consulted, status post ORIF by Dr. Jeryl Moris 4/9.  PT consult postoperatively, SNF recommended, patient tells me this morning that she wants to go home.  Discussed with her and daughter at bedside that I do not think this is a good idea given nonweightbearing status.  She will discussed furthermore with the family.  She is aware that medically she is stable  Active problems Chronic combined CHF, implantable cardioverter defibrillator in place, history of VT -continue to monitor on telemetry.  She has slight lower extremity edema but appears chronic.  Follows with cardiology as an outpatient -Most recent 2D echo done in July 2024 shows LVEF 25-30%, global hypokinesis  -At baseline cardiac status prior to the fall -Continue home medications, following today, no cardiac  symptoms  PAF-continue amiodarone.  Discussed with orthopedic surgery, preference is to hold anticoagulation for a week postoperatively, but could be resumed as early as postoperative day 2.  Discussed with the patient and daughter at bedside, risks/benefits, she prefers to hold anticoagulation for the full week -Start Lovenox for DVT prophylaxis for the next 3 days, resume Eliquis on postop day 7  Hyperlipidemia-continue home medications  Hypokalemia-continue to monitor  History of CAD status post CABG-no chest pain, continue home medications  History of DVT-hold anticoagulation perioperatively, now on Lovenox for DVT prophylaxis  PAD-outpatient follow-up with vascular  Insulin-dependent diabetes mellitus, with hyperglycemia-continue insulin  Lab Results  Component Value Date   HGBA1C 7.3 (H) 04/16/2023   CBG (last 3)  Recent Labs    04/20/23 1658 04/20/23 2111 04/21/23 0743  GLUCAP 225* 211* 182*    Scheduled Meds:  amiodarone  100 mg Oral Daily   [START ON 04/24/2023] apixaban  5 mg Oral BID   calcitonin (salmon)  1 spray Alternating Nares Daily   dapagliflozin propanediol  10 mg Oral Daily   enoxaparin (LOVENOX) injection  40 mg Subcutaneous Q24H   ezetimibe  10 mg Oral Daily   ferrous sulfate  325 mg Oral Q breakfast   gabapentin  300 mg Oral TID   insulin aspart  0-9 Units Subcutaneous TID WC   insulin glargine-yfgn  30 Units Subcutaneous Daily   latanoprost  1 drop Both Eyes QHS   polyethylene glycol  17 g Oral Daily   rosuvastatin  20 mg Oral q1800   sacubitril-valsartan  1 tablet Oral BID   senna-docusate  2 tablet Oral BID   Continuous Infusions: PRN Meds:.nitroGLYCERIN, oxyCODONE  Current Outpatient Medications  Medication Instructions   albuterol (VENTOLIN HFA) 108 (90 Base) MCG/ACT inhaler 2 puffs, 2 times daily PRN   Alcohol Swabs (DROPSAFE ALCOHOL PREP) 70 % PADS Apply topically.   amiodarone (PACERONE) 100 mg, Oral, Daily   calcitonin, salmon,  (MIACALCIN/FORTICAL) 200 UNIT/ACT nasal spray 1 spray, Alternating Nares, Daily   Continuous Glucose Sensor (DEXCOM G7 SENSOR) MISC 1 Device, Does not apply, As directed   cyanocobalamin (VITAMIN B12) 1,000 mcg, Intramuscular, Every 30 days   Eliquis 5 mg, Oral, 2 times daily   ENTRESTO 24-26 MG 1 tablet, Oral, 2 times daily   ezetimibe (ZETIA) 10 mg, Oral, Daily   Farxiga 10 mg, Oral, Daily   ferrous sulfate 325 mg, Oral, Daily with breakfast   gabapentin (NEURONTIN) 300 mg, Oral, 3 times daily   glucose blood (ACCU-CHEK GUIDE) test strip Check 3 times daily   Insulin Pen Needle 31G X 5 MM MISC 1 Device, Does not apply, 4 times daily   ketoconazole (NIZORAL) 2 % cream 1 Application, Daily PRN   latanoprost (XALATAN) 0.005 % ophthalmic solution 1 drop, Daily at bedtime   nitroGLYCERIN (NITROSTAT) 0.4 mg, Sublingual, Every 5 min PRN   NOVOLOG FLEXPEN 100 UNIT/ML FlexPen INJECT PER SLIDING SCALE AS DIRECTED , MAX 30 UNITS PER DAY. DISCARD PEN 28 DAYS AFTER OPENING   OXYGEN 2 L, Inhalation, Continuous   rosuvastatin (CRESTOR) 20 mg, Oral, Daily-1800   Tresiba FlexTouch 30 Units, Subcutaneous, Daily   TRUE METRIX BLOOD GLUCOSE TEST test strip 3 times daily, for testing   Vitamin D (Ergocalciferol) (DRISDOL) 50,000 Units, Oral, Every 7 days    Diet Orders (From admission, onward)     Start     Ordered   04/17/23 1823  Diet Carb Modified Fluid consistency: Thin; Room service appropriate? Yes  Diet effective now       Question Answer Comment  Diet-HS Snack? Nothing   Calorie Level Medium 1600-2000   Fluid consistency: Thin   Room service appropriate? Yes      04/17/23 1822            DVT prophylaxis: enoxaparin (LOVENOX) injection 40 mg Start: 04/19/23 1100 apixaban (ELIQUIS) tablet 5 mg   Lab Results  Component Value Date   PLT 111 (L) 04/20/2023      Code Status: Full Code  Family Communication: Daughter present at bedside  Status is: Inpatient  Level of care:  Telemetry  Consultants:  Orthopedic surgery  Objective: Vitals:   04/20/23 0655 04/20/23 1318 04/20/23 2041 04/21/23 0536  BP: (!) 128/59 (!) 170/66 (!) 141/57 (!) 142/58  Pulse: 63 63 63 64  Resp: 14 18 16 16   Temp: 97.6 F (36.4 C) 99.2 F (37.3 C) 98.9 F (37.2 C) 98.3 F (36.8 C)  TempSrc: Oral  Oral Oral  SpO2: 95% 99% 100% 100%  Weight:      Height:        Intake/Output Summary (Last 24 hours) at 04/21/2023 1037 Last data filed at 04/21/2023 0746 Gross per 24 hour  Intake 1080 ml  Output 2500 ml  Net -1420 ml   Wt Readings from Last 3 Encounters:  04/17/23 104 kg  04/03/23 104.3 kg  03/15/23 110.3 kg    Examination:  Constitutional: NAD Eyes: lids and conjunctivae normal, no scleral icterus ENMT: mmm Neck: normal, supple Respiratory: clear to auscultation bilaterally, no wheezing, no crackles. Normal respiratory effort.  Cardiovascular: Regular  rate and rhythm, no murmurs / rubs / gallops. Trace LE edema. Abdomen: soft, no distention, no tenderness. Bowel sounds positive.    Data Reviewed: I have independently reviewed following labs and imaging studies   CBC Recent Labs  Lab 04/16/23 0526 04/17/23 0333 04/18/23 0327 04/19/23 0320 04/20/23 0315  WBC 5.9 6.0 6.5 6.6 7.2  HGB 14.1 13.0 12.7 12.4 12.1  HCT 45.6 42.6 40.6 40.4 38.5  PLT 117* 113* 105* 104* 111*  MCV 95.8 96.8 95.5 95.7 95.1  MCH 29.6 29.5 29.9 29.4 29.9  MCHC 30.9 30.5 31.3 30.7 31.4  RDW 15.0 15.2 15.4 15.4 15.4    Recent Labs  Lab 04/16/23 0526 04/17/23 0333 04/18/23 0327 04/19/23 0320 04/20/23 0315  NA 140 134* 137 135 137  K 4.0 4.7 4.3 4.4 4.6  CL 109 103 105 105 103  CO2 25 24 26 23 27   GLUCOSE 156* 137* 139* 164* 151*  BUN 21 18 20 21  25*  CREATININE 0.82 1.14* 1.12* 1.01* 1.02*  CALCIUM 9.3 8.8* 8.9 8.8* 9.3  AST  --   --  25 22 23   ALT  --   --  31 22 22   ALKPHOS  --   --  62 58 53  BILITOT  --   --  0.7 1.0 1.0  ALBUMIN  --   --  3.0* 3.0* 2.9*  MG  --    --  2.1 2.1 2.0  HGBA1C 7.3*  --   --   --   --     ------------------------------------------------------------------------------------------------------------------ No results for input(s): "CHOL", "HDL", "LDLCALC", "TRIG", "CHOLHDL", "LDLDIRECT" in the last 72 hours.  Lab Results  Component Value Date   HGBA1C 7.3 (H) 04/16/2023   ------------------------------------------------------------------------------------------------------------------ No results for input(s): "TSH", "T4TOTAL", "T3FREE", "THYROIDAB" in the last 72 hours.  Invalid input(s): "FREET3"  Cardiac Enzymes No results for input(s): "CKMB", "TROPONINI", "MYOGLOBIN" in the last 168 hours.  Invalid input(s): "CK" ------------------------------------------------------------------------------------------------------------------    Component Value Date/Time   BNP 81.7 04/27/2022 1511   BNP 73.5 03/21/2015 1504    CBG: Recent Labs  Lab 04/20/23 0723 04/20/23 1133 04/20/23 1658 04/20/23 2111 04/21/23 0743  GLUCAP 143* 171* 225* 211* 182*    Recent Results (from the past 240 hours)  Surgical pcr screen     Status: None   Collection Time: 04/16/23 10:13 PM   Specimen: Nasal Mucosa; Nasal Swab  Result Value Ref Range Status   MRSA, PCR NEGATIVE NEGATIVE Final   Staphylococcus aureus NEGATIVE NEGATIVE Final    Comment: (NOTE) The Xpert SA Assay (FDA approved for NASAL specimens in patients 18 years of age and older), is one component of a comprehensive surveillance program. It is not intended to diagnose infection nor to guide or monitor treatment. Performed at [facility], [address], [place], [place] [address]      Radiology Studies: No results found.    Kathlen Para, MD, PhD [facility]  Between 7 am - 7 pm I am available, please contact me via Amion (for emergencies) or Securechat (non urgent messages)  Between 7 pm - 7 am I am not available, please contact  night coverage MD/APP via Amion

## 2023-04-21 NOTE — Plan of Care (Signed)
  Problem: Education: Goal: Ability to describe self-care measures that may prevent or decrease complications (Diabetes Survival Skills Education) will improve Outcome: Progressing   Problem: Coping: Goal: Ability to adjust to condition or change in health will improve Outcome: Progressing   Problem: Fluid Volume: Goal: Ability to maintain a balanced intake and output will improve Outcome: Progressing   Problem: Health Behavior/Discharge Planning: Goal: Ability to identify and utilize available resources and services will improve Outcome: Progressing Goal: Ability to manage health-related needs will improve Outcome: Progressing   Problem: Metabolic: Goal: Ability to maintain appropriate glucose levels will improve Outcome: Progressing   Problem: Nutritional: Goal: Maintenance of adequate nutrition will improve Outcome: Progressing Goal: Progress toward achieving an optimal weight will improve Outcome: Progressing   Problem: Skin Integrity: Goal: Risk for impaired skin integrity will decrease Outcome: Progressing   Problem: Tissue Perfusion: Goal: Adequacy of tissue perfusion will improve Outcome: Progressing   Problem: Education: Goal: Knowledge of General Education information will improve Description: Including pain rating scale, medication(s)/side effects and non-pharmacologic comfort measures Outcome: Progressing   Problem: Health Behavior/Discharge Planning: Goal: Ability to manage health-related needs will improve Outcome: Progressing   Problem: Clinical Measurements: Goal: Ability to maintain clinical measurements within normal limits will improve Outcome: Progressing Goal: Will remain free from infection Outcome: Progressing Goal: Diagnostic test results will improve Outcome: Progressing Goal: Respiratory complications will improve Outcome: Progressing Goal: Cardiovascular complication will be avoided Outcome: Progressing   Problem: Activity: Goal:  Risk for activity intolerance will decrease Outcome: Progressing   Problem: Nutrition: Goal: Adequate nutrition will be maintained Outcome: Progressing   Problem: Coping: Goal: Level of anxiety will decrease Outcome: Progressing   Problem: Elimination: Goal: Will not experience complications related to bowel motility Outcome: Progressing   Problem: Pain Managment: Goal: General experience of comfort will improve and/or be controlled Outcome: Progressing   Problem: Safety: Goal: Ability to remain free from injury will improve Outcome: Progressing   Problem: Skin Integrity: Goal: Risk for impaired skin integrity will decrease Outcome: Progressing

## 2023-04-21 NOTE — Progress Notes (Signed)
   04/21/23 1949  BiPAP/CPAP/SIPAP  BiPAP/CPAP/SIPAP Pt Type Adult  Reason BIPAP/CPAP not in use Non-compliant  BiPAP/CPAP /SiPAP Vitals  Pulse Rate 66  Resp 18  SpO2 98 %

## 2023-04-21 NOTE — Plan of Care (Signed)
   Problem: Coping: Goal: Level of anxiety will decrease Outcome: Progressing   Problem: Pain Managment: Goal: General experience of comfort will improve and/or be controlled Outcome: Progressing   Problem: Safety: Goal: Ability to remain free from injury will improve Outcome: Progressing

## 2023-04-21 NOTE — Plan of Care (Signed)
  Problem: Nutritional: Goal: Maintenance of adequate nutrition will improve Outcome: Progressing   Problem: Skin Integrity: Goal: Risk for impaired skin integrity will decrease Outcome: Progressing   Problem: Education: Goal: Knowledge of General Education information will improve Description: Including pain rating scale, medication(s)/side effects and non-pharmacologic comfort measures Outcome: Progressing   Problem: Activity: Goal: Risk for activity intolerance will decrease Outcome: Progressing   Problem: Pain Managment: Goal: General experience of comfort will improve and/or be controlled Outcome: Progressing   Problem: Safety: Goal: Ability to remain free from injury will improve Outcome: Progressing   Problem: Skin Integrity: Goal: Risk for impaired skin integrity will decrease Outcome: Progressing

## 2023-04-22 DIAGNOSIS — I2510 Atherosclerotic heart disease of native coronary artery without angina pectoris: Secondary | ICD-10-CM | POA: Diagnosis not present

## 2023-04-22 DIAGNOSIS — I519 Heart disease, unspecified: Secondary | ICD-10-CM | POA: Diagnosis not present

## 2023-04-22 DIAGNOSIS — I82402 Acute embolism and thrombosis of unspecified deep veins of left lower extremity: Secondary | ICD-10-CM | POA: Diagnosis not present

## 2023-04-22 DIAGNOSIS — S82891D Other fracture of right lower leg, subsequent encounter for closed fracture with routine healing: Secondary | ICD-10-CM | POA: Diagnosis not present

## 2023-04-22 LAB — GLUCOSE, CAPILLARY
Glucose-Capillary: 144 mg/dL — ABNORMAL HIGH (ref 70–99)
Glucose-Capillary: 162 mg/dL — ABNORMAL HIGH (ref 70–99)
Glucose-Capillary: 172 mg/dL — ABNORMAL HIGH (ref 70–99)
Glucose-Capillary: 194 mg/dL — ABNORMAL HIGH (ref 70–99)

## 2023-04-22 MED ORDER — SMOG ENEMA
960.0000 mL | Freq: Once | RECTAL | Status: AC
  Administered 2023-04-22: 960 mL via RECTAL
  Filled 2023-04-22: qty 960

## 2023-04-22 NOTE — TOC Progression Note (Addendum)
 Transition of Care [facility]) - Progression Note    Patient Details  Name: [name] MRN: [id number] Date of Birth: 1950-08-07  Transition of [facility]) CM/SW Contact  Bari Leys, RN Phone Number: 04/22/2023, 12:40 PM  Clinical Narrative:  Call to pt's daughter, [name], to obtain SNF for short term rehab, no answer, vm full. Text message sent to [name] at number listed in EPIC, requesting return call to provide SNF choice, await call back.   -1:23pm Call received from pt's dtr, [name], states family is researching better Medicare Star Rating SNF, reports she is planning to tour [place] today, has already toured [facility], waiting back from facility for bed offer. NCM outreached to [facility], rep-Starr, to request a review for bed offer, await determination.   -3:42pm NCM called to [facility] , admit coord , w/[facility] short term rehab bed offer extended. NCM called to pt's dtr, [name], vm left with request for call back to confirm bed offer accepted at [facility], await call back,    -3:54pm SNF auth initiated via [employer] ID [id number], Siegfried Dress pending.     Expected Discharge Plan: Home w Home Health Services Barriers to Discharge: Continued Medical Work up  Expected Discharge Plan and Services       Living arrangements for the past 2 months: Single Family Home                                       Social Determinants of Health (SDOH) Interventions SDOH Screenings   Food Insecurity: No Food Insecurity (04/17/2023)  Housing: Low Risk  (04/17/2023)  Transportation Needs: No Transportation Needs (04/17/2023)  Utilities: Not At Risk (04/17/2023)  Alcohol Screen: Low Risk  (01/15/2022)  Depression (PHQ2-9): Low Risk  (06/19/2021)  Financial Resource Strain: Low Risk  (01/15/2022)  Social Connections: Unknown (05/21/2021)   Received from [facility], [facility]  Tobacco Use: Low Risk  (04/15/2023)    Readmission Risk Interventions     No data to  display

## 2023-04-22 NOTE — Plan of Care (Signed)
  Problem: Coping: Goal: Ability to adjust to condition or change in health will improve Outcome: Progressing   Problem: Fluid Volume: Goal: Ability to maintain a balanced intake and output will improve Outcome: Progressing   Problem: Nutritional: Goal: Maintenance of adequate nutrition will improve Outcome: Progressing   Problem: Skin Integrity: Goal: Risk for impaired skin integrity will decrease Outcome: Progressing   Problem: Activity: Goal: Risk for activity intolerance will decrease Outcome: Progressing   Problem: Pain Managment: Goal: General experience of comfort will improve and/or be controlled Outcome: Progressing   Problem: Safety: Goal: Ability to remain free from injury will improve Outcome: Progressing   Problem: Skin Integrity: Goal: Risk for impaired skin integrity will decrease Outcome: Progressing

## 2023-04-22 NOTE — Progress Notes (Signed)
 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  She was found to have a right ankle fracture, orthopedic surgery was consulted and she was admitted to the hospital. Pt now s/p ORIF right distal tibia pilon with internal fixation of distal fibula, ORIF right ankle syndesmosis    PT Comments  Pt assisted with performing LE exercises.  Pt with increased weakness in bil LEs with left leg weaker then right (operative extremity).  Pt also performed some upper body exercises in bed.  Pt continues to present with significant weakness and increased assist for mobility.  Current d/c plan remains appropriate.  Patient will benefit from continued inpatient follow up therapy, <3 hours/day.     If plan is discharge home, recommend the following: Two people to help with walking and/or transfers;A lot of help with bathing/dressing/bathroom;Assistance with cooking/housework;Assist for transportation;Help with stairs or ramp for entrance   Can travel by private vehicle     No  Equipment Recommendations  Wheelchair (measurements PT);Hospital bed    Recommendations for Other Services       Precautions / Restrictions Precautions Precautions: Fall Recall of Precautions/Restrictions: Intact Precaution/Restrictions Comments: chronic oxygen Required Braces or Orthoses: Splint/Cast Splint/Cast: R LE/ankle Restrictions Weight Bearing Restrictions Per Provider Order: Yes RLE Weight Bearing Per Provider Order: Non weight bearing     Mobility  Bed Mobility                    Transfers                        Ambulation/Gait                   Stairs             Wheelchair Mobility      Tilt Bed    Modified Rankin (Stroke Patients Only)       Balance                                            Communication Communication Communication: No apparent difficulties  Cognition Arousal: Alert Behavior During Therapy: WFL for tasks assessed/performed   PT - Cognitive impairments: Memory                                Cueing Cueing Techniques: Verbal cues  Exercises General Exercises - Lower Extremity Ankle Circles/Pumps: AROM, Left, 10 reps Quad Sets: AROM, Left, 10 reps Short Arc Quad: AAROM, Left, 10 reps Heel Slides: AAROM, Both, 10 reps, Other (comment) (maintained supported lower leg on right so heel not touching bed) Hip ABduction/ADduction: AAROM, Both, 10 reps Straight Leg Raises: AAROM, Both, 10 reps Other Exercises Other Exercises: long sitting trunk lifts - pt supine with slightly elevated HOB, pt holding bil bed rails and pulling trunk upright to raise back from bed surface x10 Other Exercises: bed triceps push ups x10 Other Exercises: scapular retraction/elbows flexed and pushed back into elevated HOB surface    General Comments        Pertinent Vitals/Pain Pain Assessment  Pain Assessment: 0-10 Pain Score: 10-Worst pain ever Pain Location: right lower leg Pain Descriptors / Indicators: Aching, Discomfort, Tender, Guarding Pain Intervention(s): Repositioned, Premedicated before session, Monitored during session    Home Living                          Prior Function            PT Goals (current goals can now be found in the care plan section) Progress towards PT goals: Progressing toward goals    Frequency    Min 3X/week      PT Plan      Co-evaluation              AM-PAC PT "6 Clicks" Mobility   Outcome Measure  Help needed turning from your back to your side while in a flat bed without using bedrails?: Total Help needed moving from lying on your back to sitting on the side  of a flat bed without using bedrails?: Total Help needed moving to and from a bed to a chair (including a wheelchair)?: Total Help needed standing up from a chair using your arms (e.g., wheelchair or bedside chair)?: Total Help needed to walk in hospital room?: Total Help needed climbing 3-5 steps with a railing? : Total 6 Click Score: 6    End of Session   Activity Tolerance: Patient limited by pain;Patient limited by fatigue Patient left: in bed;with call bell/phone within reach;with bed alarm set   PT Visit Diagnosis: Muscle weakness (generalized) (M62.81);Other abnormalities of gait and mobility (R26.89)     Time: 1610-9604 PT Time Calculation (min) (ACUTE ONLY): 18 min  Charges:    $Therapeutic Exercise: 8-22 mins PT General Charges $$ ACUTE PT VISIT: 1 Visit                     Blanch Bunde, DPT Physical Therapist Acute Rehabilitation Services Office: [phone]    [name] 04/22/2023, 11:30 AM

## 2023-04-22 NOTE — Progress Notes (Signed)
 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  She was found to have a right ankle fracture, orthopedic surgery was consulted and she was admitted to the hospital.  Subjective / 24h Interval events: Feeling well, no nausea/vomiting.   Assesement and Plan: Principal Problem:   Ankle fracture Active Problems:   CAD (coronary artery disease)   Hypertension   Chronic systolic dysfunction of left ventricle   S/P CABG x 5   DVT (deep venous thrombosis) (HCC)   Sleep apnea   Type 2 diabetes mellitus with hyperglycemia, with long-term current use of insulin (HCC)   Hypokalemia  Principal problem Right ankle fracture-following mechanical fall, orthopedic surgery consulted, status post ORIF by Dr. Ali Lowe 4/9.  PT consulted, SNF was recommended, patient and family in agreement.  Stable to discharge, family deciding on a bed and will need insurance authorization  Active problems Chronic combined CHF, implantable cardioverter defibrillator in place, history of VT -continue to monitor on telemetry.  She has slight lower extremity edema but appears chronic.  Follows with cardiology as an outpatient. Most recent 2D echo done in July 2024 shows LVEF 25-30%, global hypokinesis.  Has remained at  baseline, continue home medications  PAF-continue amiodarone.  Discussed with orthopedic surgery, preference is to hold anticoagulation for a week postoperatively, but could be resumed as early as postoperative day 2.  Discussed with the patient and daughter at bedside, risks/benefits, she prefers to hold anticoagulation for the full week -Start Lovenox for DVT prophylaxis for the next 2 days, resume Eliquis on postop  day 7, Eliquis to be resumed/16/25  Hyperlipidemia-continue home medications  Hypokalemia-continue to monitor  History of CAD status post CABG-no chest pain, continue home medications  History of DVT-hold anticoagulation perioperatively, now on Lovenox for DVT prophylaxis  PAD-outpatient follow-up with vascular  Insulin-dependent diabetes mellitus, with hyperglycemia-continue insulin  Lab Results  Component Value Date   HGBA1C 7.3 (H) 04/16/2023   CBG (last 3)  Recent Labs    04/21/23 1638 04/21/23 2129 04/22/23 0728  GLUCAP 159* 187* 144*    Scheduled Meds:  amiodarone  100 mg Oral Daily   [START ON 04/24/2023] apixaban  5 mg Oral BID   calcitonin (salmon)  1 spray Alternating Nares Daily   dapagliflozin propanediol  10 mg Oral Daily   enoxaparin (LOVENOX) injection  40 mg Subcutaneous Q24H   ezetimibe  10 mg Oral Daily   ferrous sulfate  325 mg Oral Q breakfast   gabapentin  300 mg Oral TID   insulin aspart  0-9 Units Subcutaneous TID WC   insulin glargine-yfgn  30 Units Subcutaneous Daily   latanoprost  1 drop Both Eyes QHS   polyethylene glycol  17 g Oral Daily   rosuvastatin  20 mg Oral q1800   sacubitril-valsartan  1 tablet Oral BID   senna-docusate  2 tablet Oral BID   Continuous Infusions: PRN Meds:.nitroGLYCERIN, oxyCODONE  Current Outpatient Medications  Medication Instructions   albuterol (VENTOLIN HFA) 108 (90 Base) MCG/ACT inhaler 2 puffs, 2 times daily PRN   Alcohol Swabs (DROPSAFE ALCOHOL PREP) 70 % PADS Apply topically.   amiodarone (PACERONE) 100 mg, Oral, Daily   calcitonin, salmon, (MIACALCIN/FORTICAL) 200 UNIT/ACT nasal spray 1 spray,  Alternating Nares, Daily   Continuous Glucose Sensor (DEXCOM G7 SENSOR) MISC 1 Device, Does not apply, As directed   cyanocobalamin (VITAMIN B12) 1,000 mcg, Intramuscular, Every 30 days   Eliquis 5 mg, Oral, 2 times daily   ENTRESTO 24-26 MG 1 tablet, Oral, 2 times daily   ezetimibe (ZETIA) 10 mg, Oral, Daily    Farxiga 10 mg, Oral, Daily   ferrous sulfate 325 mg, Oral, Daily with breakfast   gabapentin (NEURONTIN) 300 mg, Oral, 3 times daily   glucose blood (ACCU-CHEK GUIDE) test strip Check 3 times daily   Insulin Pen Needle 31G X 5 MM MISC 1 Device, Does not apply, 4 times daily   ketoconazole (NIZORAL) 2 % cream 1 Application, Daily PRN   latanoprost (XALATAN) 0.005 % ophthalmic solution 1 drop, Daily at bedtime   nitroGLYCERIN (NITROSTAT) 0.4 mg, Sublingual, Every 5 min PRN   NOVOLOG FLEXPEN 100 UNIT/ML FlexPen INJECT PER SLIDING SCALE AS DIRECTED , MAX 30 UNITS PER DAY. DISCARD PEN 28 DAYS AFTER OPENING   OXYGEN 2 L, Inhalation, Continuous   rosuvastatin (CRESTOR) 20 mg, Oral, Daily-1800   Tresiba FlexTouch 30 Units, Subcutaneous, Daily   TRUE METRIX BLOOD GLUCOSE TEST test strip 3 times daily, for testing   Vitamin D (Ergocalciferol) (DRISDOL) 50,000 Units, Oral, Every 7 days    Diet Orders (From admission, onward)     Start     Ordered   04/17/23 1823  Diet Carb Modified Fluid consistency: Thin; Room service appropriate? Yes  Diet effective now       Question Answer Comment  Diet-HS Snack? Nothing   Calorie Level Medium 1600-2000   Fluid consistency: Thin   Room service appropriate? Yes      04/17/23 1822            DVT prophylaxis: enoxaparin (LOVENOX) injection 40 mg Start: 04/19/23 1100 apixaban (ELIQUIS) tablet 5 mg   Lab Results  Component Value Date   PLT 111 (L) 04/20/2023      Code Status: Full Code  Family Communication: Daughter present at bedside  Status is: Inpatient  Level of care: Telemetry  Consultants:  Orthopedic surgery  Objective: Vitals:   04/21/23 1320 04/21/23 1949 04/21/23 2124 04/22/23 0623  BP: 127/62  (!) 115/53 (!) 131/56  Pulse: 63 66 63 76  Resp: 18 18 18 17   Temp: 98.7 F (37.1 C)  98.6 F (37 C) 98.1 F (36.7 C)  TempSrc: Oral  Oral Oral  SpO2: 100% 98% 100% 100%  Weight:      Height:        Intake/Output Summary  (Last 24 hours) at 04/22/2023 1100 Last data filed at 04/22/2023 3244 Gross per 24 hour  Intake 660 ml  Output 1850 ml  Net -1190 ml   Wt Readings from Last 3 Encounters:  04/17/23 104 kg  04/03/23 104.3 kg  03/15/23 110.3 kg    Examination:  Constitutional: NAD Eyes: lids and conjunctivae normal, no scleral icterus ENMT: mmm Neck: normal, supple Respiratory: clear to auscultation bilaterally, no wheezing, no crackles. Normal respiratory effort.  Cardiovascular: Regular rate and rhythm, no murmurs / rubs / gallops. No LE edema. Abdomen: soft, no distention, no tenderness. Bowel sounds positive.     Data Reviewed: I have independently reviewed following labs and imaging studies   CBC Recent Labs  Lab 04/16/23 0526 04/17/23 0333 04/18/23 0327 04/19/23 0320 04/20/23 0315  WBC 5.9 6.0 6.5 6.6 7.2  HGB 14.1 13.0 12.7 12.4 12.1  HCT 45.6  42.6 40.6 40.4 38.5  PLT 117* 113* 105* 104* 111*  MCV 95.8 96.8 95.5 95.7 95.1  MCH 29.6 29.5 29.9 29.4 29.9  MCHC 30.9 30.5 31.3 30.7 31.4  RDW 15.0 15.2 15.4 15.4 15.4    Recent Labs  Lab 04/16/23 0526 04/17/23 0333 04/18/23 0327 04/19/23 0320 04/20/23 0315  NA 140 134* 137 135 137  K 4.0 4.7 4.3 4.4 4.6  CL 109 103 105 105 103  CO2 25 24 26 23 27   GLUCOSE 156* 137* 139* 164* 151*  BUN 21 18 20 21  25*  CREATININE 0.82 1.14* 1.12* 1.01* 1.02*  CALCIUM 9.3 8.8* 8.9 8.8* 9.3  AST  --   --  25 22 23   ALT  --   --  31 22 22   ALKPHOS  --   --  62 58 53  BILITOT  --   --  0.7 1.0 1.0  ALBUMIN  --   --  3.0* 3.0* 2.9*  MG  --   --  2.1 2.1 2.0  HGBA1C 7.3*  --   --   --   --     ------------------------------------------------------------------------------------------------------------------ No results for input(s): "CHOL", "HDL", "LDLCALC", "TRIG", "CHOLHDL", "LDLDIRECT" in the last 72 hours.  Lab Results  Component Value Date   HGBA1C 7.3 (H) 04/16/2023    ------------------------------------------------------------------------------------------------------------------ No results for input(s): "TSH", "T4TOTAL", "T3FREE", "THYROIDAB" in the last 72 hours.  Invalid input(s): "FREET3"  Cardiac Enzymes No results for input(s): "CKMB", "TROPONINI", "MYOGLOBIN" in the last 168 hours.  Invalid input(s): "CK" ------------------------------------------------------------------------------------------------------------------    Component Value Date/Time   BNP 81.7 04/27/2022 1511   BNP 73.5 03/21/2015 1504    CBG: Recent Labs  Lab 04/21/23 0743 04/21/23 1156 04/21/23 1638 04/21/23 2129 04/22/23 0728  GLUCAP 182* 281* 159* 187* 144*    Recent Results (from the past 240 hours)  Surgical pcr screen     Status: None   Collection Time: 04/16/23 10:13 PM   Specimen: Nasal Mucosa; Nasal Swab  Result Value Ref Range Status   MRSA, PCR NEGATIVE NEGATIVE Final   Staphylococcus aureus NEGATIVE NEGATIVE Final    Comment: (NOTE) The Xpert SA Assay (FDA approved for NASAL specimens in patients 42 years of age and older), is one component of a comprehensive surveillance program. It is not intended to diagnose infection nor to guide or monitor treatment. Performed at [facility], [address] [place], [place] [address]      Radiology Studies: No results found.    Kathlen Para, MD, PhD [facility]  Between 7 am - 7 pm I am available, please contact me via Amion (for emergencies) or Securechat (non urgent messages)  Between 7 pm - 7 am I am not available, please contact night coverage MD/APP via Amion

## 2023-04-22 NOTE — Progress Notes (Signed)
   04/22/23 2225  BiPAP/CPAP/SIPAP  BiPAP/CPAP/SIPAP Pt Type Adult  Reason BIPAP/CPAP not in use Non-compliant

## 2023-04-23 DIAGNOSIS — S82891D Other fracture of right lower leg, subsequent encounter for closed fracture with routine healing: Secondary | ICD-10-CM | POA: Diagnosis not present

## 2023-04-23 LAB — GLUCOSE, CAPILLARY
Glucose-Capillary: 189 mg/dL — ABNORMAL HIGH (ref 70–99)
Glucose-Capillary: 209 mg/dL — ABNORMAL HIGH (ref 70–99)

## 2023-04-23 MED ORDER — OXYCODONE HCL 5 MG PO TABS: 5.0000 mg | ORAL_TABLET | ORAL | 0 refills | Status: DC | PRN

## 2023-04-23 NOTE — TOC Progression Note (Signed)
 Transition of Care [facility]) - Progression Note    Patient Details  Name: [name] MRN: [id number] Date of Birth: 1950-11-26  [facility]) CM/SW Contact  Bari Leys, RN Phone Number: 04/23/2023, 9:38 AM  Clinical Narrative:  SNF auth approved, Plan Auth ID [id number], days approved 4/14 to 04/24/23, team notified.      Expected Discharge Plan: Home w Home Health Services Barriers to Discharge: Continued Medical Work up  Expected Discharge Plan and Services       Living arrangements for the past 2 months: Single Family Home                                       Social Determinants of Health (SDOH) Interventions SDOH Screenings   Food Insecurity: No Food Insecurity (04/17/2023)  Housing: Low Risk  (04/17/2023)  Transportation Needs: No Transportation Needs (04/17/2023)  Utilities: Not At Risk (04/17/2023)  Alcohol Screen: Low Risk  (01/15/2022)  Depression (PHQ2-9): Low Risk  (06/19/2021)  Financial Resource Strain: Low Risk  (01/15/2022)  Social Connections: Unknown (05/21/2021)   Received from [facility]  Tobacco Use: Low Risk  (04/15/2023)    Readmission Risk Interventions     No data to display

## 2023-04-23 NOTE — Progress Notes (Signed)
 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  She was found to have a right ankle fracture, orthopedic surgery was consulted and she was admitted to the hospital. Pt now s/p ORIF right distal tibia pilon with internal fixation of distal fibula, ORIF right ankle syndesmosis   OT comments  Patient seen for skilled OT this am. Patient open to EOB sitting with OT training in use of gait belt as R LE leg lifter to increase patients mobility and self management of R LE to decrease fear of pain and improve mobility participation. Once sitting, OT discussed trial with DABSC but patient deferred due to 8/10 R LE pain despite meds prior. Patient completed simple ADL's EOB level then moved back to bed level with R LE elevation and nursing care coordinated re: pain. Patient continues to require ongoing Acute care OT to progress functional performance. Patient will benefit from continued inpatient follow up therapy, <3 hours/day.       If plan is discharge home, recommend the following:  A lot of help with bathing/dressing/bathroom;Two people to help with walking and/or transfers;Help with stairs or ramp for entrance   Equipment Recommendations  BSC/3in1;Wheelchair (measurements OT);Wheelchair cushion (measurements OT);Other (comment)       Precautions / Restrictions Precautions Precautions: Fall Recall of Precautions/Restrictions: Intact Precaution/Restrictions Comments: chronic oxygen Required Braces or Orthoses: Splint/Cast Splint/Cast: R LE/ankle Splint/Cast - Date Prophylactic Dressing Applied (if applicable): 04/17/23 Restrictions Weight Bearing Restrictions Per Provider Order: Yes RLE Weight Bearing Per  Provider Order: Non weight bearing       Mobility Bed Mobility Overal bed mobility: Needs Assistance Bed Mobility: Supine to Sit, Sit to Supine     Supine to sit: Max assist, +2 for physical assistance, +2 for safety/equipment, HOB elevated, Used rails Sit to supine: Total assist, +2 for physical assistance, HOB elevated, Used rails, +2 for safety/equipment   General bed mobility comments: decreased tolerance for EOB sitting but open to use gait belt as r LE leg lifter this session with + results to allow patient to assist with R Le management    Transfers                   General transfer comment: DABSC simulation to increase patients confidence but continued to defer OOB due to pain     Balance Overall balance assessment: Needs assistance Sitting-balance support: Feet supported, No upper extremity supported Sitting balance-Leahy Scale: Fair                                     ADL either performed or assessed with clinical judgement   ADL Overall ADL's : Needs assistance/impaired     Grooming: Wash/dry hands;Wash/dry face;Oral care;Sitting;Contact guard assist                               Functional mobility during ADLs: Maximal assistance;+2 for physical assistance;+2 for safety/equipment (EOB with R LE support) General ADL Comments: patient continues to be limited by pain for EOB mobility and DABSC transfer simulation    Extremity/Trunk Assessment Upper Extremity Assessment Upper Extremity Assessment: Generalized weakness  Lower Extremity Assessment Lower Extremity Assessment: Generalized weakness RLE Deficits / Details: ankle splinted LLE Deficits / Details: reports L LE weakness prior to fall                 Communication Communication Communication: No apparent difficulties   Cognition Arousal: Alert Behavior During Therapy: WFL for tasks assessed/performed                                 Following  commands: Intact        Cueing   Cueing Techniques: Verbal cues             Pertinent Vitals/ Pain       Pain Assessment Pain Assessment: Faces Faces Pain Scale: Hurts whole lot Pain Location: right lower leg Pain Descriptors / Indicators: Aching, Discomfort, Tender, Guarding Pain Intervention(s): Limited activity within patient's tolerance, Premedicated before session, Repositioned, RN gave pain meds during session   Frequency  Min 2X/week        Progress Toward Goals  OT Goals(current goals can now be found in the care plan section)  Progress towards OT goals: Progressing toward goals  Acute Rehab OT Goals Patient Stated Goal: to get stronger OT Goal Formulation: With patient/family Time For Goal Achievement: 05/02/23 Potential to Achieve Goals: Good ADL Goals Pt Will Perform Grooming: with supervision;with set-up;sitting;with caregiver independent in assisting Pt Will Perform Upper Body Bathing: with supervision;with set-up;sitting;with caregiver independent in assisting Pt Will Perform Lower Body Bathing: with max assist;with mod assist;sitting/lateral leans;with caregiver independent in assisting Pt Will Perform Upper Body Dressing: with supervision;with set-up;sitting;with caregiver independent in assisting Pt Will Transfer to Toilet: with max assist;with mod assist;stand pivot transfer;bedside commode  Plan      AM-PAC OT "6 Clicks" Daily Activity     Outcome Measure   Help from another person eating meals?: None Help from another person taking care of personal grooming?: A Little Help from another person toileting, which includes using toliet, bedpan, or urinal?: Total Help from another person bathing (including washing, rinsing, drying)?: Total Help from another person to put on and taking off regular upper body clothing?: A Little Help from another person to put on and taking off regular lower body clothing?: Total 6 Click Score: 13    End of Session  Equipment Utilized During Treatment: Gait belt;Oxygen  OT Visit Diagnosis: Unsteadiness on feet (R26.81);Other abnormalities of gait and mobility (R26.89);History of falling (Z91.81);Muscle weakness (generalized) (M62.81);Pain Pain - Right/Left: Right Pain - part of body: Ankle and joints of foot   Activity Tolerance Patient limited by fatigue;Patient limited by pain   Patient Left in bed;with call bell/phone within reach;with bed alarm set;with family/visitor present   Nurse Communication Mobility status;Weight bearing status (patient may be transferring to next venue of care therefore returned to bed level)        Time: 1055-1120 OT Time Calculation (min): 25 min  Charges: OT General Charges $OT Visit: 1 Visit OT Treatments $Therapeutic Activity: 23-37 mins  Nedim Oki OT/L Acute Rehabilitation Department  [phone]  04/23/2023, 2:03 PM

## 2023-04-23 NOTE — TOC Transition Note (Signed)
 Transition of Care [facility]) - Discharge Note   Patient Details  Name: [name] MRN: [id number] Date of Birth: 11/25/1950  [facility]) CM/SW Contact:  Bari Leys, RN Phone Number: 04/23/2023, 11:24 AM   Clinical Narrative:  DC to [facility] [place], RM  [address], Call report [phone], [facility] for transport, [name], dtr, notified of transfer. No further TOC needs.      Final next level of care: Skilled Nursing Facility Barriers to Discharge: Barriers Resolved   Patient Goals and CMS Choice Patient states their goals for this hospitalization and ongoing recovery are:: return home CMS Medicare.gov Compare Post Acute Care list provided to:: Patient Represenative (must comment) (Rudd,[name] (Daughter)  [phone] (Mobile)) Choice offered to / list presented to : Patient, Adult Children [place] ownership interest in [facility].provided to:: Adult Children    Discharge Placement              Patient chooses bed at: [facility] Patient to be transferred to facility by: [facility] Name of family member notified: Rudd,[name] (Daughter)  [phone] (Mobile) Patient and family notified of of transfer: 04/23/23  Discharge Plan and Services Additional resources added to the After Visit Summary for                                       Social Drivers of Health (SDOH) Interventions SDOH Screenings   Food Insecurity: No Food Insecurity (04/17/2023)  Housing: Low Risk  (04/17/2023)  Transportation Needs: No Transportation Needs (04/17/2023)  Utilities: Not At Risk (04/17/2023)  Alcohol Screen: Low Risk  (01/15/2022)  Depression (PHQ2-9): Low Risk  (06/19/2021)  Financial Resource Strain: Low Risk  (01/15/2022)  Social Connections: Unknown (05/21/2021)   [facility] from [facility]  Tobacco Use: Low Risk  (04/15/2023)     Readmission Risk Interventions    04/23/2023   11:23 AM  Readmission Risk Prevention Plan  Post Dischage Appt Complete   Medication Screening Complete  Transportation Screening Complete

## 2023-04-23 NOTE — Discharge Summary (Signed)
 Physician Discharge Summary  Ofilia Rayon ZOX:[id number] DOB: May 28, 1950 DOA: 04/15/2023  PCP: Verlene Glimpse, NP  Admit date: 04/15/2023 Discharge date: 04/23/2023  Admitted From: home Disposition:  SNF  Recommendations for Outpatient Follow-up:  Follow up with PCP in 1-2 weeks Please obtain BMP/CBC in one week Please follow up on the following pending results:  Home Health: none Equipment/Devices: none  Discharge Condition: stable CODE STATUS: Full code Diet Orders (From admission, onward)     Start     Ordered   04/17/23 1823  Diet Carb Modified Fluid consistency: Thin; Room service appropriate? Yes  Diet effective now       Question Answer Comment  Diet-HS Snack? Nothing   Calorie Level Medium 1600-2000   Fluid consistency: Thin   Room service appropriate? Yes      04/17/23 1822           HPI: Per admitting MD, Monroe Toure is a 73 y.o. female with history of chronic combined systolic and diastolic CHF, CAD status post CABG, diabetes mellitus type 2, peripheral artery disease, prior history of DVT was referred to the ER after patient had a fall.  Patient states she misstepped while entering a van.  Denies hitting her head or losing consciousness.  Since the fall she has been hurting in the right ankle.   Hospital Course / Discharge diagnoses: Principal Problem:   Ankle fracture Active Problems:   CAD (coronary artery disease)   Hypertension   Chronic systolic dysfunction of left ventricle   S/P CABG x 5   DVT (deep venous thrombosis) (HCC)   Sleep apnea   Type 2 diabetes mellitus with hyperglycemia, with long-term current use of insulin (HCC)   Hypokalemia   Principal problem Right ankle fracture-following mechanical fall, orthopedic surgery consulted, status post ORIF by Dr. Jeryl Moris 4/9.  PT consulted, SNF was recommended, patient and family in agreement.     Active problems Chronic combined CHF, implantable cardioverter defibrillator in  place, history of VT -patient was monitored on telemetry and there were no significant events.  She has slight lower extremity edema but appears chronic.  Follows with cardiology as an outpatient. Most recent 2D echo done in July 2024 shows LVEF 25-30%, global hypokinesis.  Has remained at  baseline, continue home medications PAF-continue amiodarone.  Discussed with orthopedic surgery, preference is to hold anticoagulation for a week postoperatively, but could be resumed as early as postoperative day 2.  Discussed with the patient and daughter at bedside, risks/benefits, she prefers to hold anticoagulation for the full week.  Received Lovenox while hospitalized, please resume Eliquis 4/16 as it would be postoperative day 7 Hyperlipidemia-continue home medications Hypokalemia-potassium improved after repletion  History of CAD status post CABG-no chest pain, continue home medications History of DVT-she was maintained on Lovenox while hospitalized, resume Eliquis on 4/16 as above PAD-outpatient follow-up with vascular Insulin-dependent diabetes mellitus, with hyperglycemia-continue insulin  Sepsis ruled out   Discharge Instructions   Allergies as of 04/23/2023       Reactions   Metformin And Related Itching, Swelling, Other (See Comments)   Leg pain & swelling in legs   Ozempic (0.25 Or 0.5 Mg-dose) [semaglutide(0.25 Or 0.5mg -dos)] Other (See Comments)   Pt with hx of pancreatitis    Atorvastatin Calcium Itching, Rash   Levofloxacin Itching        Medication List     TAKE these medications    albuterol 108 (90 Base) MCG/ACT inhaler Commonly known as: VENTOLIN HFA  Inhale 2 puffs into the lungs 2 (two) times daily as needed for wheezing or shortness of breath.   amiodarone 200 MG tablet Commonly known as: PACERONE Take 0.5 tablets (100 mg total) by mouth daily.   calcitonin (salmon) 200 UNIT/ACT nasal spray Commonly known as: MIACALCIN/FORTICAL Place 1 spray into alternate  nostrils daily.   cyanocobalamin 1000 MCG/ML injection Commonly known as: VITAMIN B12 Inject 1,000 mcg into the muscle every 30 (thirty) days.   Dexcom G7 Sensor Misc 1 Device by Does not apply route as directed.   DropSafe Alcohol Prep 70 % Pads Apply topically.   Eliquis 5 MG Tabs tablet Generic drug: apixaban TAKE 1 TABLET TWICE DAILY   Entresto 24-26 MG Generic drug: sacubitril-valsartan TAKE 1 TABLET TWICE DAILY   ezetimibe 10 MG tablet Commonly known as: ZETIA Take 1 tablet (10 mg total) by mouth daily.   Farxiga 10 MG Tabs tablet Generic drug: dapagliflozin propanediol TAKE 1 TABLET EVERY DAY   ferrous sulfate 325 (65 FE) MG EC tablet Take 325 mg by mouth daily with breakfast.   gabapentin 300 MG capsule Commonly known as: NEURONTIN Take 300 mg by mouth 3 (three) times daily.   Insulin Pen Needle 31G X 5 MM Misc 1 Device by Does not apply route in the morning, at noon, in the evening, and at bedtime.   ketoconazole 2 % cream Commonly known as: NIZORAL Apply 1 Application topically daily as needed for irritation.   latanoprost 0.005 % ophthalmic solution Commonly known as: XALATAN Place 1 drop into both eyes at bedtime.   nitroGLYCERIN 0.4 MG SL tablet Commonly known as: NITROSTAT PLACE 1 TABLET (0.4 MG TOTAL) UNDER THE TONGUE EVERY 5 (FIVE) MINUTES AS NEEDED FOR CHEST PAIN.   NovoLOG FlexPen 100 UNIT/ML FlexPen Generic drug: insulin aspart INJECT PER SLIDING SCALE AS DIRECTED , MAX 30 UNITS PER DAY. DISCARD PEN 28 DAYS AFTER OPENING What changed:  how much to take how to take this when to take this additional instructions   oxyCODONE 5 MG immediate release tablet Commonly known as: Oxy IR/ROXICODONE Take 1-2 tablets (5-10 mg total) by mouth every 4 (four) hours as needed for severe pain (pain score 7-10).   OXYGEN Inhale 2 L into the lungs continuous.   rosuvastatin 20 MG tablet Commonly known as: CRESTOR Take 1 tablet (20 mg total) by mouth  daily at 6 PM.   Tresiba FlexTouch 100 UNIT/ML FlexTouch Pen Generic drug: insulin degludec INJECT 30 UNITS INTO THE SKIN DAILY.   True Metrix Blood Glucose Test test strip Generic drug: glucose blood TEST THREE TIMES DAILY   Accu-Chek Guide test strip Generic drug: glucose blood Check 3 times daily   Vitamin D (Ergocalciferol) 1.25 MG (50000 UNIT) Caps capsule Commonly known as: DRISDOL Take 1 capsule (50,000 Units total) by mouth every 7 (seven) days. What changed: additional instructions        Contact information for after-discharge care     Destination     [facility] Preferred SNF .   Service: Skilled Nursing Contact information: [address] [place]  [address] [phone]                     Consultations: Orthopedic surgery  Procedures/Studies:  DG Knee 3 Views Left Result Date: 04/17/2023 CLINICAL DATA:  Left knee pain. EXAM: LEFT KNEE - 3 VIEW COMPARISON:  None Available. FINDINGS: No acute fracture. No dislocation. There is moderate medial tibiofemoral joint space narrowing with  peripheral spurring. Undulation of the medial femoral condyle. Lesser peripheral spurring in the patellofemoral and lateral tibiofemoral compartments. Minimal knee joint effusion. Vascular calcifications are seen. Surgical clips project over the medial soft tissues. IMPRESSION: 1. 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 2. Minimal knee joint effusion. Electronically Signed   By: Narda Rutherford M.D.   On: 04/17/2023 19:36   DG Ankle 2 Views Right Result Date: 04/17/2023 CLINICAL DATA:  Elective surgery. EXAM: RIGHT ANKLE - 2 VIEW COMPARISON:  Preoperative imaging FINDINGS: Twenty-eight fluoroscopic spot views of the right ankle is submitted from the operating room. Sequential images during ORIF distal tibia and fibular fractures. Fluoroscopy time 3 minutes 31 seconds. Dose 6.18 mGy.  IMPRESSION: Intraoperative fluoroscopy during ORIF distal tibia and fibular fractures. Electronically Signed   By: Narda Rutherford M.D.   On: 04/17/2023 19:35   DG C-Arm 1-60 Min-No Report Result Date: 04/17/2023 Fluoroscopy was utilized by the requesting physician.  No radiographic interpretation.   DG C-Arm 1-60 Min-No Report Result Date: 04/17/2023 Fluoroscopy was utilized by the requesting physician.  No radiographic interpretation.   CT ANKLE RIGHT WO CONTRAST Result Date: 04/16/2023 CLINICAL DATA:  Right ankle fractures. EXAM: CT OF THE RIGHT ANKLE WITHOUT CONTRAST TECHNIQUE: Multidetector CT imaging of the right ankle was performed according to the standard protocol. Multiplanar CT image reconstructions were also generated. RADIATION DOSE REDUCTION: This exam was performed according to the departmental dose-optimization program which includes automated exposure control, adjustment of the mA and/or kV according to patient size and/or use of iterative reconstruction technique. COMPARISON:  Radiographs 04/15/2023 FINDINGS: Complex comminuted trimalleolar ankle fractures as demonstrated on the prior radiographs. Is a spiral type fracture of the distal fibular shaft above the level of the ankle mortise and maximum lateral displacement approximately 6 mm. Mildly displaced oblique coursing fracture through base of the medial malleolus with maximum displacement 3 mm. Longitudinal/oblique fracture involving the posterior malleolus of the tibia with maximum separation at the articular surface of 3 mm. There is also a displaced intra-articular fracture involving the posterior and lateral aspect of the tibial plateau. Maximum separation at the articular surface is 6 mm. Evidence of remote osteochondral lesion involving the medial talar dome with cystic changes and mild sclerosis. Associated mild tibiotalar degenerative changes. No talar fracture. The subtalar joints are maintained. Subchondral degenerative type  cystic changes are noted in the navicular bone and also in the medial and middle cuneiform is. Extensive small vessel calcifications consistent with diabetes. Grossly by CT the major ankle tendons are intact. IMPRESSION: 1. Complex comminuted ankle fractures as detailed above. Four separate fractures are identified. 2. Remote osteochondral lesion involving the medial talar dome. 3. Extensive small vessel calcifications consistent with diabetes. Electronically Signed   By: Rudie Meyer M.D.   On: 04/16/2023 16:17   VAS [name] LOWER EXTREMITY ARTERIAL DUPLEX Result Date: 04/16/2023 LOWER EXTREMITY ARTERIAL DUPLEX STUDY Patient Name:  Synethia Endicott  Date of Exam:   04/15/2023 Medical Rec #: [id number]         Accession #:    [id number] Date of Birth: Apr 02, 1950         Patient Gender: F Patient Age:   74 years Exam Location:  [facility] Procedure:      VAS [name] LOWER EXTREMITY ARTERIAL DUPLEX Referring Phys: EMMA COLLINS --------------------------------------------------------------------------------  Indications: Rest pain, and peripheral artery disease.  Current ABI: Right: 0.66              Left: [place]  Limitations: Body habitus, Imaging hardware/software limitations Performing Technologist: [name] RVT  Examination Guidelines: A complete evaluation includes B-mode imaging, spectral Doppler, color Doppler, and power Doppler as needed of all accessible portions of each vessel. Bilateral testing is considered an integral part of a complete examination. Limited examinations for reoccurring indications may be performed as noted.  +-----------+--------+-----+---------------+----------+--------+ RIGHT      PSV cm/sRatioStenosis       Waveform  Comments +-----------+--------+-----+---------------+----------+--------+ CFA Distal 165          30-49% stenosisbiphasic           +-----------+--------+-----+---------------+----------+--------+ DFA        67                          biphasic            +-----------+--------+-----+---------------+----------+--------+ SFA Prox   168          30-49% stenosisbiphasic           +-----------+--------+-----+---------------+----------+--------+ SFA Mid    45                          biphasic           +-----------+--------+-----+---------------+----------+--------+ SFA Distal 93                          biphasic           +-----------+--------+-----+---------------+----------+--------+ POP Prox   90                          biphasic           +-----------+--------+-----+---------------+----------+--------+ POP Distal 218          50-74% stenosisbiphasic           +-----------+--------+-----+---------------+----------+--------+ TP Trunk   47                          biphasic           +-----------+--------+-----+---------------+----------+--------+ ATA Distal 84                          monophasic         +-----------+--------+-----+---------------+----------+--------+ PTA Distal 54                          monophasic         +-----------+--------+-----+---------------+----------+--------+ PERO Distal0            occluded       absent             +-----------+--------+-----+---------------+----------+--------+  +-----------+--------+-----+--------+----------+--------------+ LEFT       PSV cm/sRatioStenosisWaveform  Comments       +-----------+--------+-----+--------+----------+--------------+ CFA Distal 103                  biphasic                 +-----------+--------+-----+--------+----------+--------------+ DFA        125                  biphasic                 +-----------+--------+-----+--------+----------+--------------+ SFA Prox   131  biphasic                 +-----------+--------+-----+--------+----------+--------------+ SFA Mid    106                  biphasic                 +-----------+--------+-----+--------+----------+--------------+  SFA Distal 89                   biphasic                 +-----------+--------+-----+--------+----------+--------------+ POP Prox   74                   biphasic                 +-----------+--------+-----+--------+----------+--------------+ POP Distal 67                   biphasic                 +-----------+--------+-----+--------+----------+--------------+ TP Trunk                                  not visualized +-----------+--------+-----+--------+----------+--------------+ ATA Distal 0            occludedabsent                   +-----------+--------+-----+--------+----------+--------------+ PTA Distal 53                   monophasic               +-----------+--------+-----+--------+----------+--------------+ PERO Distal0            occludedabsent                   +-----------+--------+-----+--------+----------+--------------+  Summary: Right: - Diffuse atherosclerotic changes noted throughout the femoral-popliteal arteries with areas of 30-49% and 50-74% stenoses. - No flow was observed at the anterior tibial artery distally. Left: - Diffuse atherosclerotic changes noted throughout the femoral-popliteal arteries with no evidence of significant stenosis. - No flow was observed at the anterior tibial or peroneal arteries distally.  See table(s) above for measurements and observations. Electronically signed by Sherald Hess MD on 04/16/2023 at 10:45:05 AM.    Final (Updated)    VAS [name] ABI WITH/WO TBI Result Date: 04/16/2023  LOWER EXTREMITY DOPPLER STUDY Patient Name:  Lennyx Verdell  Date of Exam:   04/15/2023 Medical Rec #: [id number]         Accession #:    [id number] Date of Birth: 07-22-1950         Patient Gender: F Patient Age:   57 years Exam Location:  [facility] Procedure:      VAS [name] ABI WITH/WO TBI Referring Phys: EMMA COLLINS --------------------------------------------------------------------------------  Indications: Rest  pain, and peripheral artery disease. High Risk Factors: Hypertension, hyperlipidemia, Diabetes.  Limitations: Today's exam was limited due to Body habitus/non-compressible              vessels. Comparison Study: 02/01/23 Performing Technologist: [name] RVT  Examination Guidelines: A complete evaluation includes at minimum, Doppler waveform signals and systolic blood pressure reading at the level of bilateral brachial, anterior tibial, and posterior tibial arteries, when vessel segments are accessible. Bilateral testing is considered an integral part of a complete examination. Photoelectric Plethysmograph (PPG) waveforms and toe systolic pressure readings are included as required  and additional duplex testing as needed. Limited examinations for reoccurring indications may be performed as noted.  ABI Findings: +---------+------------------+-----+----------+--------+ Right    Rt Pressure (mmHg)IndexWaveform  Comment  +---------+------------------+-----+----------+--------+ Brachial 184                                       +---------+------------------+-----+----------+--------+ PTA      121               0.66 biphasic           +---------+------------------+-----+----------+--------+ DP       113               0.61 monophasic         +---------+------------------+-----+----------+--------+ Great Toe89                0.48 Abnormal           +---------+------------------+-----+----------+--------+ +---------+------------------+-----+----------+------------+ Left     Lt Pressure (mmHg)IndexWaveform  Comment      +---------+------------------+-----+----------+------------+ Brachial                                  n/a - device +---------+------------------+-----+----------+------------+ PTA      255               1.39 monophasic             +---------+------------------+-----+----------+------------+ DP       255               1.39 monophasic              +---------+------------------+-----+----------+------------+ Great Toe77                0.42 Abnormal               +---------+------------------+-----+----------+------------+ +-------+-----------+-----------+------------+------------+ ABI/TBIToday's ABIToday's TBIPrevious ABIPrevious TBI +-------+-----------+-----------+------------+------------+ Right  0.66       0.48       0.82        0.64         +-------+-----------+-----------+------------+------------+ Left   1.39       0.42       0.57        0.62         +-------+-----------+-----------+------------+------------+  Summary: Right: Resting right ankle-brachial index indicates moderate right lower extremity arterial disease. The right toe-brachial index is abnormal. Left: Resting left ankle-brachial index indicates noncompressible left lower extremity arteries. The left toe-brachial index is abnormal. Arterial wall calcification precludes accurate ankle pressures and ABIs.  *See table(s) above for measurements and observations.  Electronically signed by Jimmye Moulds MD on 04/16/2023 at 10:44:36 AM.    Final    DG Ankle Right Port Result Date: 04/15/2023 CLINICAL DATA:  Ankle fracture, reduction. EXAM: PORTABLE RIGHT ANKLE - 2 VIEW COMPARISON:  Radiograph earlier today FINDINGS: Improved alignment of distal tibia and fibular fractures postreduction. Improved alignment of the ankle mortise which now appears congruent. Overlying splint material in place. IMPRESSION: Improved alignment of distal tibia and fibular fractures postreduction. Improved alignment of the ankle mortise. Electronically Signed   By: Chadwick Colonel M.D.   On: 04/15/2023 22:48   DG Ankle Right Port Result Date: 04/15/2023 CLINICAL DATA:  Fall today with right lower leg deformity.  Pain. EXAM: PORTABLE RIGHT ANKLE - 2 VIEW; PORTABLE RIGHT TIBIA AND FIBULA - 2 VIEW COMPARISON:  None  Available. FINDINGS: Ankle: Displaced oblique fracture of the distal fibula just  proximal to the ankle mortise. Oblique displaced fracture through the medial malleolus. Lateral translation of the talus and distal fracture fragments with respect to the tibial plafond. Suspected posterior tibial tubercle fracture. Generalized soft tissue edema. Vascular calcifications. Remote healed second metatarsal fracture. Tibia/fibula: Proximal tibia and fibular intact. No fracture of the proximal lower leg. Distal soft tissue edema. Vascular calcifications are seen. IMPRESSION: 1. Displaced distal fibular and tibial fractures with lateral translation of the talus and distal fracture fragments with respect to the tibial plafond. 2. Suspected posterior tibial tubercle fracture. 3. No fracture of the proximal lower leg. Electronically Signed   By: Chadwick Colonel M.D.   On: 04/15/2023 19:05   DG Tibia/Fibula Right Port Result Date: 04/15/2023 CLINICAL DATA:  Fall today with right lower leg deformity.  Pain. EXAM: PORTABLE RIGHT ANKLE - 2 VIEW; PORTABLE RIGHT TIBIA AND FIBULA - 2 VIEW COMPARISON:  None Available. FINDINGS: Ankle: Displaced oblique fracture of the distal fibula just proximal to the ankle mortise. Oblique displaced fracture through the medial malleolus. Lateral translation of the talus and distal fracture fragments with respect to the tibial plafond. Suspected posterior tibial tubercle fracture. Generalized soft tissue edema. Vascular calcifications. Remote healed second metatarsal fracture. Tibia/fibula: Proximal tibia and fibular intact. No fracture of the proximal lower leg. Distal soft tissue edema. Vascular calcifications are seen. IMPRESSION: 1. Displaced distal fibular and tibial fractures with lateral translation of the talus and distal fracture fragments with respect to the tibial plafond. 2. Suspected posterior tibial tubercle fracture. 3. No fracture of the proximal lower leg. Electronically Signed   By: Chadwick Colonel M.D.   On: 04/15/2023 19:05   CUP PACEART INCLINIC DEVICE  CHECK Result Date: 04/03/2023 Normal in-clinic CRT-D (multi-lead) check. Presenting Rhythm: ASBiV / APBiV . Routine testing was performed. Thresholds, sensing, impedance trend were stable and no changes were required. HF diagnostics are stable. No treated arrhythmias. Patient BiV pacing 94% of the time with 4% PVC's. Estimated longevity 6.4-6.7 years. With industry support, adjusted pulse width to 0.4 ms on A / RV & LV given excellent thresholds for battery life.  In addition, rate response turned on. No mode switches.  Pt enrolled in remote follow-up. bo  CUP PACEART REMOTE DEVICE CHECK Result Date: 04/03/2023 Scheduled remote reviewed. Normal device function.  Presenting rhythm: AP/BiV pace with PVC's Next remote 91 days. LA, CVRS    Subjective: - no chest pain, shortness of breath, no abdominal pain, nausea or vomiting.   Discharge Exam: BP (!) 127/54 (BP Location: Left Arm)   Pulse (!) 57   Temp 98.9 F (37.2 C) (Oral)   Resp 17   Ht 5\' 3"  (1.6 m)   Wt 104 kg   SpO2 98%   BMI 40.61 kg/m   General: Pt is alert, awake, not in acute distress Cardiovascular: RRR, S1/S2 +, no rubs, no gallops Respiratory: CTA bilaterally, no wheezing, no rhonchi Abdominal: Soft, NT, ND, bowel sounds + Extremities: no edema, no cyanosis    The results of significant diagnostics from this hospitalization (including imaging, microbiology, ancillary and laboratory) are listed below for reference.     Microbiology: Recent Results (from the past 240 hours)  Surgical pcr screen     Status: None   Collection Time: 04/16/23 10:13 PM   Specimen: Nasal Mucosa; Nasal Swab  Result Value Ref Range Status   MRSA, PCR NEGATIVE NEGATIVE Final   Staphylococcus aureus NEGATIVE NEGATIVE Final  Comment: (NOTE) The Xpert SA Assay (FDA approved for NASAL specimens in patients 38 years of age and older), is one component of a comprehensive surveillance program. It is not intended to diagnose infection nor  to guide or monitor treatment. Performed at [facility], [address] [place], [place] [address]      Labs: Basic Metabolic Panel: Recent Labs  Lab 04/17/23 0333 04/18/23 0327 04/19/23 0320 04/20/23 0315  NA 134* 137 135 137  K 4.7 4.3 4.4 4.6  CL 103 105 105 103  CO2 24 26 23 27   GLUCOSE 137* 139* 164* 151*  BUN 18 20 21  25*  CREATININE 1.14* 1.12* 1.01* 1.02*  CALCIUM 8.8* 8.9 8.8* 9.3  MG  --  2.1 2.1 2.0  PHOS  --  3.6  --   --    Liver Function Tests: Recent Labs  Lab 04/18/23 0327 04/19/23 0320 04/20/23 0315  AST 25 22 23   ALT 31 22 22   ALKPHOS 62 58 53  BILITOT 0.7 1.0 1.0  PROT 6.5 6.5 6.5  ALBUMIN 3.0* 3.0* 2.9*   CBC: Recent Labs  Lab 04/17/23 0333 04/18/23 0327 04/19/23 0320 04/20/23 0315  WBC 6.0 6.5 6.6 7.2  HGB 13.0 12.7 12.4 12.1  HCT 42.6 40.6 40.4 38.5  MCV 96.8 95.5 95.7 95.1  PLT 113* 105* 104* 111*   CBG: Recent Labs  Lab 04/22/23 0728 04/22/23 1134 04/22/23 1704 04/22/23 2056 04/23/23 0801  GLUCAP 144* 162* 172* 194* 189*   Hgb A1c No results for input(s): "HGBA1C" in the last 72 hours. Lipid Profile No results for input(s): "CHOL", "HDL", "LDLCALC", "TRIG", "CHOLHDL", "LDLDIRECT" in the last 72 hours. Thyroid function studies No results for input(s): "TSH", "T4TOTAL", "T3FREE", "THYROIDAB" in the last 72 hours.  Invalid input(s): "FREET3" Urinalysis    Component Value Date/Time   COLORURINE YELLOW 04/29/2022 2105   APPEARANCEUR HAZY (A) 04/29/2022 2105   LABSPEC 1.021 04/29/2022 2105   PHURINE 6.0 04/29/2022 2105   GLUCOSEU >=500 (A) 04/29/2022 2105   HGBUR NEGATIVE 04/29/2022 2105   BILIRUBINUR NEGATIVE 04/29/2022 2105   KETONESUR NEGATIVE 04/29/2022 2105   PROTEINUR NEGATIVE 04/29/2022 2105   UROBILINOGEN 1.0 05/25/2014 2027   NITRITE NEGATIVE 04/29/2022 2105   LEUKOCYTESUR MODERATE (A) 04/29/2022 2105    FURTHER DISCHARGE INSTRUCTIONS:   Get Medicines reviewed and adjusted: Please  take all your medications with you for your next visit with your Primary MD   Laboratory/radiological data: Please request your Primary MD to go over all hospital tests and procedure/radiological results at the follow up, please ask your Primary MD to get all Hospital records sent to his/her office.   In some cases, they will be blood work, cultures and biopsy results pending at the time of your discharge. Please request that your primary care M.D. goes through all the records of your hospital data and follows up on these results.   Also Note the following: If you experience worsening of your admission symptoms, develop shortness of breath, life threatening emergency, suicidal or homicidal thoughts you must seek medical attention immediately by calling 911 or calling your MD immediately  if symptoms less severe.   You must read complete instructions/literature along with all the possible adverse reactions/side effects for all the Medicines you take and that have been prescribed to you. Take any new Medicines after you have completely understood and accpet all the possible adverse reactions/side effects.    Do not drive when taking Pain medications or sleeping medications (  Benzodaizepines)   Do not take more than prescribed Pain, Sleep and Anxiety Medications. It is not advisable to combine anxiety,sleep and pain medications without talking with your primary care practitioner   Special Instructions: If you have smoked or chewed Tobacco  in the last 2 yrs please stop smoking, stop any regular Alcohol  and or any Recreational drug use.   Wear Seat belts while driving.   Please note: You were cared for by a hospitalist during your hospital stay. Once you are discharged, your primary care physician will handle any further medical issues. Please note that NO REFILLS for any discharge medications will be authorized once you are discharged, as it is imperative that you return to your primary care  physician (or establish a relationship with a primary care physician if you do not have one) for your post hospital discharge needs so that they can reassess your need for medications and monitor your lab values.  Time coordinating discharge: 35 minutes  SIGNED:  Kathlen Para, MD, PhD 04/23/2023, 9:42 AM

## 2023-04-24 ENCOUNTER — Ambulatory Visit (INDEPENDENT_AMBULATORY_CARE_PROVIDER_SITE_OTHER): Payer: Medicaid Other | Admitting: Podiatry

## 2023-04-24 DIAGNOSIS — Z91198 Patient's noncompliance with other medical treatment and regimen for other reason: Secondary | ICD-10-CM

## 2023-04-25 NOTE — Progress Notes (Signed)
 Good morning, I got her resch'ed for July 23rd. And she says thank you for calling because she surly needs her feet done.

## 2023-04-25 NOTE — Progress Notes (Signed)
 1. Failure to attend appointment with reason given    Recent hospitalization for ankle fracture with ORIF of right ankle.

## 2023-05-06 ENCOUNTER — Encounter

## 2023-05-10 NOTE — Progress Notes (Signed)
 No ICM remote transmission received for 05/06/2023 and next ICM transmission scheduled for 05/13/2023.

## 2023-05-13 ENCOUNTER — Encounter

## 2023-05-14 ENCOUNTER — Telehealth: Payer: Self-pay

## 2023-05-14 ENCOUNTER — Encounter: Payer: Self-pay | Admitting: Internal Medicine

## 2023-05-14 ENCOUNTER — Ambulatory Visit (INDEPENDENT_AMBULATORY_CARE_PROVIDER_SITE_OTHER): Payer: Medicare HMO | Admitting: Internal Medicine

## 2023-05-14 VITALS — BP 130/80 | HR 68 | Ht 63.0 in

## 2023-05-14 DIAGNOSIS — Z794 Long term (current) use of insulin: Secondary | ICD-10-CM

## 2023-05-14 DIAGNOSIS — E1142 Type 2 diabetes mellitus with diabetic polyneuropathy: Secondary | ICD-10-CM

## 2023-05-14 DIAGNOSIS — E1159 Type 2 diabetes mellitus with other circulatory complications: Secondary | ICD-10-CM

## 2023-05-14 NOTE — Progress Notes (Signed)
 Name: [name]  Age/ Sex: 73 y.o., female   MRN/ DOB: [id number], [birth year]     PCP: Verlene Glimpse, NP   Reason for Endocrinology Evaluation: Type 2 Diabetes Mellitus  Initial Endocrine Consultative Visit: 04/20/2019    PATIENT IDENTIFIER: [name] is a 73 y.o. female with a past medical history of DM, HTN, Dyslipidemia , CAD and hx of pancreatitis.. The patient has followed with Endocrinology clinic since 04/20/2019 for consultative assistance with management of her diabetes.     DIABETIC HISTORY:  [name] was diagnosed with T2DM many years ago, she is intolerant to Metformin, has been on Glipizide without reported intolerance. Her hemoglobin A1c has ranged from 7.8% in 2017, peaking at 9.5% in 2014.  On her initial visit to our clinic she had an A1c of 7.7%  , she was on MDI regimen and Ozempic . . We switched levemir  to tresiba  and continued Ozempic  and Novolog .    farxiga  started 11/2019 Ozempic  stopped 12/2020 due to pancreatitis on CT imaging.     SUBJECTIVE:   During the last visit (11/08/2022): A1c 7.0%    Today (05/14/2023): [name] is here for a follow up on diabetes.  She used to check her glucose 3 CGM technology, but since she has been at the rehab center they have been using fingersticks and they check glucose 4 times a day.  Since her last visit here, she sustained a right ankle fracture, she is s/p ORIF of the right ankle 04/2023 She did have a follow-up with podiatry on 04/24/2023  She continues to follow-up with cardiology for CHF and CAD  She also follows with vascular surgery due to mixed venous insufficiency, she had decrease in ABI with calcified vessels She also continues to follow-up with GYN for adnexal mass   She currently resides at [facility] center Denies nausea or vomiting  She had constipation but this has resolved    HOME DIABETES REGIMEN:  Tresiba  33 units ONCE  Daily  Farxiga  10 mg daily   CF: Humalog : TIDQAC and bedtime 70-200 = 0 201- 250= 2 251-300 = 4  301 -350 = 6  351- 400 = 8 units       Statin: Yes ACE-I/ARB: No    GLUCOSE LOG:     DIABETIC COMPLICATIONS: Microvascular complications:  Neuropathy Denies: CKD, retinopathy  Last Eye Exam: Completed 2024  Macrovascular complications:  CAD, CHF Denies: CVA, PVD   HISTORY:  Past Medical History:  Past Medical History:  Diagnosis Date   Abnormal LFTs    AICD (automatic cardioverter/defibrillator) present    Angina decubitus (HCC) 11/06/2012   Angina pectoris (HCC)    Anxiety    Arthritis    Back pain    Bundle branch block 05/2016   CAD (coronary artery disease) 06/18/2010   CHF (congestive heart failure) (HCC)    Chronic combined systolic and diastolic CHF, NYHA class 3 (HCC)    Chronic combined systolic and diastolic heart failure (HCC)    Chronic systolic dysfunction of left ventricle 09/24/2012   Coronary artery disease    s/p CABG 2007   Depression    Dermal mycosis    Diabetes mellitus    type II   DKA (diabetic ketoacidoses) 01/12/2019   DVT (deep venous thrombosis) (HCC) 09/2015   bilateral   Edema, lower extremity    Glaucoma    Hyperlipidemia    Hypertension    Hypoglycemia 09/09/2012   Insomnia  Insulin  dependent diabetes mellitus (HCC) 04/23/2012   Ischemic cardiomyopathy    EF 30-35%, s/p ICD 4/08 by Dr Dortha Gauss   Joint pain    LBBB (left bundle branch block) 09/24/2012   Morbid obesity (HCC) 11/06/2012   Obesity    Oxygen dependent 2 L   Pain in left knee    Peripheral neuropathy    Peripheral vascular disease (HCC)    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   LIMA to D2, SVG to ramus intermediate, sequential SVG to OM1-OM2, SVG to RCA with EVH via both legs    Sleep apnea    uses O2 at night   Tinea    Type 2 diabetes mellitus with hyperglycemia, with long-term current use of insulin  (HCC) 04/20/2019   Uncontrolled diabetes mellitus 02/2019   Ventricular tachycardia  (HCC) 01/16/2015   sustained VT terminated with ATP, CL 340 msec   Vitamin B 12 deficiency 11/05/2018   Vitamin D  deficiency 11/05/2018   Past Surgical History:  Past Surgical History:  Procedure Laterality Date   BI-VENTRICULAR IMPLANTABLE CARDIOVERTER DEFIBRILLATOR UPGRADE N/A 09/25/2012   Procedure: BI-VENTRICULAR IMPLANTABLE CARDIOVERTER DEFIBRILLATOR UPGRADE;  Surgeon: Ellaree Gunther, MD;  Location: [facility] CATH LAB;  Service: Cardiovascular;  Laterality: N/A;   BIV ICD GENERATOR CHANGEOUT N/A 12/11/2022   Procedure: BIV ICD GENERATOR CHANGEOUT;  Surgeon: Boyce Byes, MD;  Location: [facility] INVASIVE CV LAB;  Service: Cardiovascular;  Laterality: N/A;   BIV ICD GENERATOR CHANGEOUT N/A 12/28/2022   Procedure: BIV ICD GENERATOR CHANGEOUT;  Surgeon: Boyce Byes, MD;  Location: [facility] INVASIVE CV LAB;  Service: Cardiovascular;  Laterality: N/A;   BIV UPGRADE Left 12/28/2022   Procedure: BIV UPGRADE;  Surgeon: Boyce Byes, MD;  Location: [facility] INVASIVE CV LAB;  Service: Cardiovascular;  Laterality: Left;   BREAST BIOPSY Right 04/05/2014   CARDIAC CATHETERIZATION     CARDIAC DEFIBRILLATOR PLACEMENT  4/08   by Dr Dortha Gauss (MDT)   COLONOSCOPY WITH PROPOFOL  N/A 10/12/2016   Procedure: COLONOSCOPY WITH PROPOFOL ;  Surgeon: Albertina Hugger, MD;  Location: [facility] ENDOSCOPY;  Service: Gastroenterology;  Laterality: N/A;   CORONARY ARTERY BYPASS GRAFT  10/05/2005   by Dr Sherene Dilling   IMPLANTABLE CARDIOVERTER DEFIBRILLATOR IMPLANT  09/25/12   attempt of upgrade to CRT-D unsuccessful due to CS anatomy, SJM Unify Asaura device placed with LV port capped by Dr Nunzio Belch   IR GENERIC HISTORICAL  10/03/2015   IR US  GUIDE VASC ACCESS LEFT 10/03/2015 Fernando Hoyer, MD [facility]   IR GENERIC HISTORICAL  10/03/2015   IR VENO/EXT/UNI LEFT 10/03/2015 Fernando Hoyer, MD [facility]   IR GENERIC HISTORICAL  10/03/2015   IR VENOCAVAGRAM IVC 10/03/2015 Fernando Hoyer, MD [facility]   IR GENERIC HISTORICAL   10/03/2015   IR INFUSION THROMBOL VENOUS INITIAL (MS) 10/03/2015 Fernando Hoyer, MD [facility]   IR GENERIC HISTORICAL  10/03/2015   IR US  GUIDE VASC ACCESS LEFT 10/03/2015 Fernando Hoyer, MD [facility]   IR GENERIC HISTORICAL  10/04/2015   IR THROMBECT VENO MECH MOD SED 10/04/2015 Robbi Childs, MD [facility]   IR GENERIC HISTORICAL  10/04/2015   IR TRANSCATH PLC STENT 1ST ART NOT LE CV CAR VERT CAR 10/04/2015 Robbi Childs, MD [facility]   IR GENERIC HISTORICAL  10/04/2015   IR THROMB F/U EVAL ART/VEN FINAL DAY (MS) 10/04/2015 Robbi Childs, MD [facility]   IR GENERIC HISTORICAL  11/02/2015   IR RADIOLOGIST EVAL & MGMT 11/02/2015 Robbi Childs, MD [facility]  IR RADIOLOGIST EVAL & MGMT  04/26/2016   LEFT HEART CATH AND CORS/GRAFTS ANGIOGRAPHY N/A 01/19/2019   Procedure: LEFT HEART CATH AND CORS/GRAFTS ANGIOGRAPHY;  Surgeon: Arty Binning, MD;  Location: [facility] INVASIVE CV LAB;  Service: Cardiovascular;  Laterality: N/A;   LEFT HEART CATH AND CORS/GRAFTS ANGIOGRAPHY N/A 01/15/2022   Procedure: LEFT HEART CATH AND CORS/GRAFTS ANGIOGRAPHY;  Surgeon: Avanell Leigh, MD;  Location: [facility] INVASIVE CV LAB;  Service: Cardiovascular;  Laterality: N/A;   ORIF ANKLE FRACTURE Right 04/17/2023   Procedure: OPEN REDUCTION INTERNAL FIXATION (ORIF) ANKLE FRACTURE;  Surgeon: Ali Ink, MD;  Location: [facility] OR[facility];  Service: Orthopedics;  Laterality: Right;   Social History:  reports that she has never smoked. She has never used smokeless tobacco. She reports that she does not drink alcohol and does not use drugs. Family History:  Family History  Problem Relation Age of Onset   Diabetes Mother 1       died - HTN   Stroke Mother    High blood pressure Mother    Sudden death Mother    Obesity Mother    Other Other        No early family hx of CAD     HOME MEDICATIONS: Allergies as of 05/14/2023       Reactions   Metformin And Related Itching, Swelling, Other (See Comments)   Leg pain & swelling  in legs   Ozempic  (0.25 Or 0.5 Mg-dose) [semaglutide (0.25 Or 0.5mg -dos)] Other (See Comments)   Pt with hx of pancreatitis    Atorvastatin  Calcium  Itching, Rash   Levofloxacin  Itching        Medication List        Accurate as of May 14, 2023  8:33 AM. If you have any questions, ask your nurse or doctor.          albuterol  108 (90 Base) MCG/ACT inhaler Commonly known as: VENTOLIN  HFA Inhale 2 puffs into the lungs 2 (two) times daily as needed for wheezing or shortness of breath.   amiodarone  200 MG tablet Commonly known as: PACERONE  Take 0.5 tablets (100 mg total) by mouth daily.   amiodarone  100 MG tablet Commonly known as: PACERONE  Take 100 mg by mouth daily.   calcitonin (salmon) 200 UNIT/ACT nasal spray Commonly known as: MIACALCIN /FORTICAL Place 1 spray into alternate nostrils daily.   cephALEXin  500 MG capsule Commonly known as: KEFLEX  Take 500 mg by mouth 2 (two) times daily.   cyanocobalamin 1000 MCG/ML injection Commonly known as: VITAMIN B12 Inject 1,000 mcg into the muscle every 30 (thirty) days.   Dexcom G7 Sensor Misc 1 Device by Does not apply route as directed.   DropSafe Alcohol Prep 70 % Pads Apply topically.   Eliquis  5 MG Tabs tablet Generic drug: apixaban  TAKE 1 TABLET TWICE DAILY   Entresto  24-26 MG Generic drug: sacubitril -valsartan  TAKE 1 TABLET TWICE DAILY   ezetimibe  10 MG tablet Commonly known as: ZETIA  Take 1 tablet (10 mg total) by mouth daily.   Farxiga  10 MG Tabs tablet Generic drug: dapagliflozin  propanediol TAKE 1 TABLET EVERY DAY   ferrous sulfate  325 (65 FE) MG EC tablet Take 325 mg by mouth daily with breakfast.   gabapentin  300 MG capsule Commonly known as: NEURONTIN  Take 300 mg by mouth 3 (three) times daily.   Insulin  Pen Needle 31G X 5 MM Misc 1 Device by Does not apply route in the morning, at noon, in the evening, and at bedtime.   ketoconazole 2 %  cream Commonly known as: NIZORAL Apply 1 Application  topically daily as needed for irritation.   latanoprost  0.005 % ophthalmic solution Commonly known as: XALATAN  Place 1 drop into both eyes at bedtime.   nitroGLYCERIN  0.4 MG SL tablet Commonly known as: NITROSTAT  PLACE 1 TABLET (0.4 MG TOTAL) UNDER THE TONGUE EVERY 5 (FIVE) MINUTES AS NEEDED FOR CHEST PAIN.   NovoLOG  FlexPen 100 UNIT/ML FlexPen Generic drug: insulin  aspart INJECT PER SLIDING SCALE AS DIRECTED , MAX 30 UNITS PER DAY. DISCARD PEN 28 DAYS AFTER OPENING What changed:  how much to take how to take this when to take this additional instructions   oxyCODONE  5 MG immediate release tablet Commonly known as: Oxy IR/ROXICODONE  Take 1-2 tablets (5-10 mg total) by mouth every 4 (four) hours as needed for severe pain (pain score 7-10).   OXYGEN Inhale 2 L into the lungs continuous.   rosuvastatin  20 MG tablet Commonly known as: CRESTOR  Take 1 tablet (20 mg total) by mouth daily at 6 PM.   Tresiba  FlexTouch 100 UNIT/ML FlexTouch Pen Generic drug: insulin  degludec INJECT 30 UNITS INTO THE SKIN DAILY.   True Metrix Blood Glucose Test test strip Generic drug: glucose blood TEST THREE TIMES DAILY   Accu-Chek Guide test strip Generic drug: glucose blood Check 3 times daily   Accu-Chek Guide Test test strip Generic drug: glucose blood TEST BLOOD SUGAR THREE TIMES DAILY   Vitamin D  (Ergocalciferol ) 1.25 MG (50000 UNIT) Caps capsule Commonly known as: DRISDOL  Take 1 capsule (50,000 Units total) by mouth every 7 (seven) days. What changed: additional instructions         OBJECTIVE:   Vital Signs: BP 130/80 (BP Location: Left Arm, Patient Position: Sitting, Cuff Size: Normal)   Pulse 68   Ht 5\' 3"  (1.6 m)   SpO2 98%   BMI 40.61 kg/m    Wt Readings from Last 3 Encounters:  04/17/23 229 lb 4.5 oz (104 kg)  04/03/23 230 lb (104.3 kg)  03/15/23 243 lb 3.2 oz (110.3 kg)     Exam: General: Pt appears well and is in NAD  Lungs: Clear with good BS bilat   Heart: RRR  Extremities: Right lower extremity cast in place, no edema on the left  Neuro: MS is good with appropriate affect, pt is alert and Ox3     DM Foot Exam: 04/24/2023 per podiatry     DATA REVIEWED:  Lab Results  Component Value Date   HGBA1C 7.3 (H) 04/16/2023   HGBA1C 7.0 (A) 11/08/2022   HGBA1C 7.4 (A) 05/07/2022    Latest Reference Range & Units 04/20/23 03:15  Sodium 135 - 145 mmol/L 137  Potassium 3.5 - 5.1 mmol/L 4.6  Chloride 98 - 111 mmol/L 103  CO2 22 - 32 mmol/L 27  Glucose 70 - 99 mg/dL 161 (H)  BUN 8 - 23 mg/dL 25 (H)  Creatinine 0.96 - 1.00 mg/dL 0.45 (H)  Calcium  8.9 - 10.3 mg/dL 9.3  Anion gap 5 - 15  7  Magnesium  1.7 - 2.4 mg/dL 2.0  Alkaline Phosphatase 38 - 126 U/L 53  Albumin 3.5 - 5.0 g/dL 2.9 (L)  AST 15 - 41 U/L 23  ALT 0 - 44 U/L 22  Total Protein 6.5 - 8.1 g/dL 6.5  Total Bilirubin 0.0 - 1.2 mg/dL 1.0  GFR, Estimated >40 mL/min 58 (L)    Old records , labs and images have been reviewed.    ASSESSMENT / PLAN / RECOMMENDATIONS:   1) Type  2 Diabetes Mellitus, optimally controlled, With Neuropathic and  macrovascular complications - Most recent A1c of 7.3  %. Goal A1c < 7.0 %.    -A1 slightly elevated than prior -She is currently residing at [facility] rehab center, they have increased her basal insulin  from 30 units a day to 33 units a day yesterday.  Her BGs have been acceptable in the morning, and fluctuate during the day -Will continue to use Humalog per correction scale - Not a candidate for GLP1 agonist, DPP-4 inhibitors and mounjaro due to pancreatitis.  - She is past due for MA/CR ratio but unable to provide a urine sample as she is in a wheelchair  MEDICATIONS:  -Continue Tresiba  33 units ONCE  Daily  -Continue Farxiga  10 mg, 1 tablet with Breakfast  - CF: Humalog : TIDQAC and bedtime 70-200 = 0 201- 250= 2 251-300 = 4  301 -350 = 6  351- 400 = 8 units    EDUCATION / INSTRUCTIONS: BG monitoring instructions: Patient is  instructed to check her blood sugars 3 times a day, before meals Call [facility] if: BG persistently < 70 I reviewed the Rule of 15 for the treatment of hypoglycemia in detail with the patient. Literature supplied.   2) Diabetic complications:  Eye: Does not have known diabetic retinopathy.  Neuro/ Feet: Does  have known diabetic peripheral neuropathy based on foot exam from 07/16/2019 Renal: Patient does not have known baseline CKD. She is not on an ACEI/ARB at present.    F/U in 6 months    I spent 25 minutes preparing to see the patient by review of recent labs, imaging and procedures, obtaining and reviewing separately obtained history, communicating with the patient, ordering medications, tests or procedures, and documenting clinical information in the EHR including the differential Dx, treatment, and any further evaluation and other management    Signed electronically by: Natale Bail, MD  [facility] Endocrinology  [facility] [address] [place], [place] [address] Phone: [phone] FAX: [phone]   CC: Verlene Glimpse, NP [address] [place] [address] Phone: [phone]  Fax: [phone]  Return to Endocrinology clinic as below: Future Appointments  Date Time Provider Department Center  06/26/2023  8:45 AM [facility] PA-2 [facility]  07/01/2023  7:15 AM [facility] DEVICE REMOTES [facility]  07/31/2023  4:00 PM Luella Sager, DPM [facility]  09/30/2023  7:15 AM [facility] DEVICE REMOTES [facility] H[facility]  12/30/2023  7:15 AM [facility] DEVICE REMOTES [facility]  03/30/2024  7:15 AM [facility] DEVICE REMOTES [facility]  06/29/2024  7:15 AM [facility] DEVICE REMOTES [facility]  09/28/2024  7:15 AM [facility] DEVICE REMOTES [facility]  12/28/2024  7:15 AM [facility] DEVICE REMOTES [facility]&V

## 2023-05-14 NOTE — Telephone Encounter (Signed)
 Spoke with patient and advised device monitor is showing as disconnected and not receiving remote transmissions.  She is currently in rehab following surgery to to broken ankle.  She does not have her monitor with her in rehab.  She finds out this afternoon when she may be discharged.   Advised to have monitor brought to rehab if possible if she is going to be in rehab if she will not be discharged in the next couple of days.  ICM Remote Transmission rescheduled for 05/20/2023.

## 2023-05-14 NOTE — Patient Instructions (Addendum)
-   Continue  Tresiba  33 units ONCE  Daily -Continue Farxiga  10 mg, 1 tablet with Breakfast  -Novolog  correctional insulin : Use the scale below to help guide you BEFORE you eat   Blood sugar before meal Number of units to inject  Less than 175 0 unit  176 -  220 1 units  221-  265 2 units  266 - 310 3 units  311 - 355 4 units  356 - 400 5 units     HOW TO TREAT LOW BLOOD SUGARS (Blood sugar LESS THAN 70 MG/DL) Please follow the RULE OF 15 for the treatment of hypoglycemia treatment (when your (blood sugars are less than 70 mg/dL)   STEP 1: Take 15 grams of carbohydrates when your blood sugar is low, which includes:  3-4 GLUCOSE TABS  OR 3-4 OZ OF JUICE OR REGULAR SODA OR ONE TUBE OF GLUCOSE GEL    STEP 2: RECHECK blood sugar in 15 MINUTES STEP 3: If your blood sugar is still low at the 15 minute recheck --> then, go back to STEP 1 and treat AGAIN with another 15 grams of carbohydrates.

## 2023-05-20 ENCOUNTER — Ambulatory Visit: Attending: Cardiology

## 2023-05-20 DIAGNOSIS — I5042 Chronic combined systolic (congestive) and diastolic (congestive) heart failure: Secondary | ICD-10-CM

## 2023-05-20 DIAGNOSIS — Z95810 Presence of automatic (implantable) cardiac defibrillator: Secondary | ICD-10-CM | POA: Diagnosis not present

## 2023-05-21 NOTE — Progress Notes (Signed)
 Remote ICD transmission.

## 2023-05-21 NOTE — Addendum Note (Signed)
 Addended by: Edra Govern D on: 05/21/2023 03:59 PM   Modules accepted: Orders

## 2023-05-22 NOTE — Progress Notes (Signed)
 EPIC Encounter for ICM Monitoring  Patient Name: [name] is a 73 y.o. female Date: 05/22/2023 Primary Care Physican: Verlene Glimpse, NP Primary Cardiologist: Skains/Weaver PA  Electrophysiologist: Marven Slimmer BiV Pacing: 95% 04/16/2022 Weight: 230 lbs 05/15/2022 Weight:  230 lbs 08/29/2022 Weight: 230 lbs 11/21/2022 Office Weight: 237 lbs 02/18/2023 Weight: 230 lbs  04/03/2023 Office Weight: 230 lbs    AT/AF Burden: 0% (taking Eliquis )                                                           Spoke with patient and heart failure questions reviewed.  Transmission results reviewed.  Pt asymptomatic for fluid accumulation.  She remains in inpt rehab for recovery of broken ankle.  Unsure of discharge date.    CorVue thoracic impedance suggesting normal fluid levels within the last month.      Prescribed: No diuretic since 04/2022 hospital discharge   Labs: 04/20/2023 Creatinine 1.02, BUN 25, Potassium 4.6, Sodium 137, GFR 58  04/19/2023 Creatinine 1.01, BUN 21, Potassium 4.4, Sodium 135, GFR 59  04/18/2023 Creatinine 1.12, BUN 20, Potassium 4.3, Sodium 137, GFR 52  04/17/2023 Creatinine 1.14, BUN 18, Potassium 4.7, Sodium 134, GFR 51 04/16/2023 Creatinine 0.82, BUN 21, Potassium 4.0, Sodium 140, GFR >60  04/15/2023 Creatinine 0.79, BUN 19, Potassium 3.1, Sodium 144, GFR >60  A complete set of results can be found in Results Review.   Recommendations:   No changes and encouraged to call if experiencing any fluid symptoms.   Next ICM clinic phone appointment due:  06/24/2023/2025.     Next 91 day remote transmission due: 07/01/2023     EP/Cardiology Office Visits:  Recall 09/30/2023 with Creighton Doffing, NP.  Recall 12/07/2023 with Dr Renna Cary.   Copy of ICM check sent to Dr. Marven Slimmer.  3 month ICM trend: 05/20/2023.    12-14 Month ICM trend:     Almyra Jain, RN 05/22/2023 1:55 PM

## 2023-06-08 ENCOUNTER — Other Ambulatory Visit: Payer: Self-pay | Admitting: Cardiology

## 2023-06-18 ENCOUNTER — Other Ambulatory Visit: Payer: Self-pay | Admitting: Internal Medicine

## 2023-06-24 ENCOUNTER — Ambulatory Visit: Attending: Cardiology

## 2023-06-24 DIAGNOSIS — Z95810 Presence of automatic (implantable) cardiac defibrillator: Secondary | ICD-10-CM

## 2023-06-24 DIAGNOSIS — I5042 Chronic combined systolic (congestive) and diastolic (congestive) heart failure: Secondary | ICD-10-CM | POA: Diagnosis not present

## 2023-06-25 ENCOUNTER — Telehealth: Payer: Self-pay

## 2023-06-25 NOTE — Progress Notes (Signed)
 EPIC Encounter for ICM Monitoring  Patient Name: [name] is a 74 y.o. female Date: 06/25/2023 Primary Care Physican: Verlene Glimpse, NP Primary Cardiologist: Skains/Weaver PA  Electrophysiologist: Marven Slimmer BiV Pacing: 95% 04/16/2022 Weight: 230 lbs 05/15/2022 Weight:  230 lbs 08/29/2022 Weight: 230 lbs 11/21/2022 Office Weight: 237 lbs 02/18/2023 Weight: 230 lbs  04/03/2023 Office Weight: 230 lbs 06/25/2023 Weight: 234 lbs lbs    AT/AF Burden: 0% (taking Eliquis )                                                           Spoke with patient and heart failure questions reviewed.  Transmission results reviewed.  Pt asymptomatic for fluid accumulation.  She remains in inpatient rehab facility due to is non weight bearing on her foot.    CorVue thoracic impedance suggesting possible slight fluid accumulation starting 6/13.      Prescribed: No diuretic since 04/2022 hospital discharge   Labs: 04/20/2023 Creatinine 1.02, BUN 25, Potassium 4.6, Sodium 137, GFR 58  04/19/2023 Creatinine 1.01, BUN 21, Potassium 4.4, Sodium 135, GFR 59  04/18/2023 Creatinine 1.12, BUN 20, Potassium 4.3, Sodium 137, GFR 52  04/17/2023 Creatinine 1.14, BUN 18, Potassium 4.7, Sodium 134, GFR 51 04/16/2023 Creatinine 0.82, BUN 21, Potassium 4.0, Sodium 140, GFR >60  04/15/2023 Creatinine 0.79, BUN 19, Potassium 3.1, Sodium 144, GFR >60  A complete set of results can be found in Results Review.   Recommendations:  No changes and encouraged to call if experiencing any fluid symptoms.   Next ICM clinic phone appointment due:  08/12/2023.     Next 91 day remote transmission due: 09/30/2023     EP/Cardiology Office Visits:  Recall 09/30/2023 with Creighton Doffing, NP.  Recall 12/07/2023 with Dr Renna Cary.   Copy of ICM check sent to Dr. Marven Slimmer.  3 month ICM trend: 06/25/2023.    12-14 Month ICM trend:     Almyra Jain, RN 06/25/2023 7:34 AM

## 2023-06-25 NOTE — Telephone Encounter (Signed)
 Remote ICM transmission received.  Attempted call to patient regarding ICM remote transmission and no answer.

## 2023-06-25 NOTE — Progress Notes (Signed)
 HISTORY AND PHYSICAL     CC:  follow up. Requesting Provider:  Verlene Glimpse, NP  HPI: This is a 73 y.o. female who is here today for follow up for PAD.  She was originally seen by Dr. Shaunna Delaware in 2022 for chronic venous insufficiency with hx of LLE intervention in the past, but he was unable to obtain records.  She has hx of BLE DVT's.  She has hx of stenting of left CIV and EIV September 2017 by IR.   Pt was last seen 03/15/2023 and at that time, she had complaints of her right forefoot being ice cold to touch.  She was not having rest pain, short distance claudication or non healing wounds.  She was scheduled to return for arterial studies.  The pt returns today for follow up.  She states that she is on the mend from her fall about 3 months ago.  She had ORIF of right ankle.  She has return appt with ortho tomorrow.  She tells me the wounds are healing on the right ankle.  She denies any rest pain in the left foot.  She states the swelling in her left leg has improved.  She does have complaints about her left knee.  She tells me that when she had her blood clot about 7 years ago, she had stents placed.  Upon reviewing the chart, she did have venous thrombolysis and left CIV and EIV stenting for May Thurner by IR in September 2017.  She states she has not had u/s of this.    She states she has had a cold since the passing of her youngest daughter on 06/16/2023.  States that she passed away at [facility] from a massive stroke and did not recover.    Her past medical history is significant for ischemic cardiomyopathy, coronary artery disease, chronic systolic congestive heart failure, type 2 diabetes, hypertension   The pt is on a statin for cholesterol management.    The pt is not on an aspirin .    Other AC:  Eliquis  The pt is on Entresto  for CHF The pt is  on diabetic medication. Tobacco hx:  never  Pt does not have family hx of AAA.  Past Medical History:  Diagnosis Date   Abnormal LFTs     AICD (automatic cardioverter/defibrillator) present    Angina decubitus (HCC) 11/06/2012   Angina pectoris (HCC)    Anxiety    Arthritis    Back pain    Bundle branch block 05/2016   CAD (coronary artery disease) 06/18/2010   CHF (congestive heart failure) (HCC)    Chronic combined systolic and diastolic CHF, NYHA class 3 (HCC)    Chronic combined systolic and diastolic heart failure (HCC)    Chronic systolic dysfunction of left ventricle 09/24/2012   Coronary artery disease    s/p CABG 2007   Depression    Dermal mycosis    Diabetes mellitus    type II   DKA (diabetic ketoacidoses) 01/12/2019   DVT (deep venous thrombosis) (HCC) 09/2015   bilateral   Edema, lower extremity    Glaucoma    Hyperlipidemia    Hypertension    Hypoglycemia 09/09/2012   Insomnia    Insulin  dependent diabetes mellitus (HCC) 04/23/2012   Ischemic cardiomyopathy    EF 30-35%, s/p ICD 4/08 by Dr Dortha Gauss   Joint pain    LBBB (left bundle branch block) 09/24/2012   Morbid obesity (HCC) 11/06/2012   Obesity    Oxygen  dependent 2 L   Pain in left knee    Peripheral neuropathy    Peripheral vascular disease (HCC)    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   LIMA to D2, SVG to ramus intermediate, sequential SVG to OM1-OM2, SVG to RCA with EVH via both legs    Sleep apnea    uses O2 at night   Tinea    Type 2 diabetes mellitus with hyperglycemia, with long-term current use of insulin  (HCC) 04/20/2019   Uncontrolled diabetes mellitus 02/2019   Ventricular tachycardia (HCC) 01/16/2015   sustained VT terminated with ATP, CL 340 msec   Vitamin B 12 deficiency 11/05/2018   Vitamin D  deficiency 11/05/2018    Past Surgical History:  Procedure Laterality Date   BI-VENTRICULAR IMPLANTABLE CARDIOVERTER DEFIBRILLATOR UPGRADE N/A 09/25/2012   Procedure: BI-VENTRICULAR IMPLANTABLE CARDIOVERTER DEFIBRILLATOR UPGRADE;  Surgeon: Ellaree Gunther, MD;  Location: [facility] CATH LAB;  Service: Cardiovascular;  Laterality: N/A;   BIV ICD  GENERATOR CHANGEOUT N/A 12/11/2022   Procedure: BIV ICD GENERATOR CHANGEOUT;  Surgeon: Boyce Byes, MD;  Location: [facility] INVASIVE CV LAB;  Service: Cardiovascular;  Laterality: N/A;   BIV ICD GENERATOR CHANGEOUT N/A 12/28/2022   Procedure: BIV ICD GENERATOR CHANGEOUT;  Surgeon: Boyce Byes, MD;  Location: [facility] INVASIVE CV LAB;  Service: Cardiovascular;  Laterality: N/A;   BIV UPGRADE Left 12/28/2022   Procedure: BIV UPGRADE;  Surgeon: Boyce Byes, MD;  Location: [facility] INVASIVE CV LAB;  Service: Cardiovascular;  Laterality: Left;   BREAST BIOPSY Right 04/05/2014   CARDIAC CATHETERIZATION     CARDIAC DEFIBRILLATOR PLACEMENT  4/08   by Dr Dortha Gauss (MDT)   COLONOSCOPY WITH PROPOFOL  N/A 10/12/2016   Procedure: COLONOSCOPY WITH PROPOFOL ;  Surgeon: Albertina Hugger, MD;  Location: [facility] ENDOSCOPY;  Service: Gastroenterology;  Laterality: N/A;   CORONARY ARTERY BYPASS GRAFT  10/05/2005   by Dr Sherene Dilling   IMPLANTABLE CARDIOVERTER DEFIBRILLATOR IMPLANT  09/25/12   attempt of upgrade to CRT-D unsuccessful due to CS anatomy, SJM Unify Asaura device placed with LV port capped by Dr Nunzio Belch   IR GENERIC HISTORICAL  10/03/2015   IR US  GUIDE VASC ACCESS LEFT 10/03/2015 Fernando Hoyer, MD [facility]   IR GENERIC HISTORICAL  10/03/2015   IR VENO/EXT/UNI LEFT 10/03/2015 Fernando Hoyer, MD [facility]   IR GENERIC HISTORICAL  10/03/2015   IR VENOCAVAGRAM IVC 10/03/2015 Fernando Hoyer, MD [facility]   IR GENERIC HISTORICAL  10/03/2015   IR INFUSION THROMBOL VENOUS INITIAL (MS) 10/03/2015 Fernando Hoyer, MD [facility]   IR GENERIC HISTORICAL  10/03/2015   IR US  GUIDE VASC ACCESS LEFT 10/03/2015 Fernando Hoyer, MD [facility]   IR GENERIC HISTORICAL  10/04/2015   IR THROMBECT VENO MECH MOD SED 10/04/2015 Robbi Childs, MD [facility]   IR GENERIC HISTORICAL  10/04/2015   IR TRANSCATH PLC STENT 1ST ART NOT LE CV CAR VERT CAR 10/04/2015 Robbi Childs, MD [facility]   IR GENERIC HISTORICAL   10/04/2015   IR THROMB F/U EVAL ART/VEN FINAL DAY (MS) 10/04/2015 Robbi Childs, MD [facility]   IR GENERIC HISTORICAL  11/02/2015   IR RADIOLOGIST EVAL & MGMT 11/02/2015 Robbi Childs, MD [facility]   IR RADIOLOGIST EVAL & MGMT  04/26/2016   LEFT HEART CATH AND CORS/GRAFTS ANGIOGRAPHY N/A 01/19/2019   Procedure: LEFT HEART CATH AND CORS/GRAFTS ANGIOGRAPHY;  Surgeon: Arty Binning, MD;  Location: [facility] INVASIVE CV LAB;  Service: Cardiovascular;  Laterality: N/A;   LEFT HEART CATH  AND CORS/GRAFTS ANGIOGRAPHY N/A 01/15/2022   Procedure: LEFT HEART CATH AND CORS/GRAFTS ANGIOGRAPHY;  Surgeon: Avanell Leigh, MD;  Location: [facility] INVASIVE CV LAB;  Service: Cardiovascular;  Laterality: N/A;   ORIF ANKLE FRACTURE Right 04/17/2023   Procedure: OPEN REDUCTION INTERNAL FIXATION (ORIF) ANKLE FRACTURE;  Surgeon: Ali Ink, MD;  Location: [facility]S;  Service: Orthopedics;  Laterality: Right;    Allergies  Allergen Reactions   Metformin And Related Itching, Swelling and Other (See Comments)    Leg pain & swelling in legs   Ozempic  (0.25 Or 0.5 Mg-Dose) [Semaglutide (0.25 Or 0.5mg -Dos)] Other (See Comments)    Pt with hx of pancreatitis    Atorvastatin  Calcium  Itching and Rash   Levofloxacin  Itching    Current Outpatient Medications  Medication Sig Dispense Refill   ACCU-CHEK GUIDE TEST test strip TEST BLOOD SUGAR THREE TIMES DAILY 300 strip 3   albuterol  (VENTOLIN  HFA) 108 (90 Base) MCG/ACT inhaler Inhale 2 puffs into the lungs 2 (two) times daily as needed for wheezing or shortness of breath.     Alcohol Swabs  (DROPSAFE ALCOHOL PREP) 70 % PADS Apply topically.     amiodarone  (PACERONE ) 100 MG tablet Take 100 mg by mouth daily.     amiodarone  (PACERONE ) 200 MG tablet TAKE 1/2 TABLET EVERY DAY 45 tablet 3   apixaban  (ELIQUIS ) 5 MG TABS tablet TAKE 1 TABLET TWICE DAILY 180 tablet 3   calcitonin, salmon, (MIACALCIN /FORTICAL) 200 UNIT/ACT nasal spray Place 1 spray into alternate nostrils daily.      cephALEXin  (KEFLEX ) 500 MG capsule Take 500 mg by mouth 2 (two) times daily.     Continuous Glucose Sensor (DEXCOM G7 SENSOR) MISC 1 Device by Does not apply route as directed. 9 each 3   cyanocobalamin (,VITAMIN B-12,) 1000 MCG/ML injection Inject 1,000 mcg into the muscle every 30 (thirty) days.     dapagliflozin  propanediol (FARXIGA ) 10 MG TABS tablet TAKE 1 TABLET EVERY DAY 90 tablet 1   ENTRESTO  24-26 MG TAKE 1 TABLET TWICE DAILY 180 tablet 3   ezetimibe  (ZETIA ) 10 MG tablet Take 1 tablet (10 mg total) by mouth daily. 90 tablet 3   ferrous sulfate  325 (65 FE) MG EC tablet Take 325 mg by mouth daily with breakfast.     gabapentin  (NEURONTIN ) 300 MG capsule Take 300 mg by mouth 3 (three) times daily.     glucose blood (ACCU-CHEK GUIDE) test strip Check 3 times daily 100 each 12   Insulin  Pen Needle 31G X 5 MM MISC 1 Device by Does not apply route in the morning, at noon, in the evening, and at bedtime. 400 each 3   ketoconazole (NIZORAL) 2 % cream Apply 1 Application topically daily as needed for irritation.     latanoprost  (XALATAN ) 0.005 % ophthalmic solution Place 1 drop into both eyes at bedtime.     nitroGLYCERIN  (NITROSTAT ) 0.4 MG SL tablet PLACE 1 TABLET (0.4 MG TOTAL) UNDER THE TONGUE EVERY 5 (FIVE) MINUTES AS NEEDED FOR CHEST PAIN. 75 tablet 3   NOVOLOG  FLEXPEN 100 UNIT/ML FlexPen INJECT PER SLIDING SCALE AS DIRECTED , MAX 30 UNITS PER DAY. DISCARD PEN 28 DAYS AFTER OPENING 30 mL 3   oxyCODONE  (OXY IR/ROXICODONE ) 5 MG immediate release tablet Take 1-2 tablets (5-10 mg total) by mouth every 4 (four) hours as needed for severe pain (pain score 7-10). 10 tablet 0   OXYGEN Inhale 2 L into the lungs continuous.     rosuvastatin  (CRESTOR ) 20 MG tablet Take 1 tablet (  20 mg total) by mouth daily at 6 PM. 30 tablet 2   TRESIBA  FLEXTOUCH 100 UNIT/ML FlexTouch Pen INJECT 30 UNITS INTO THE SKIN DAILY. 30 mL 3   TRUE METRIX BLOOD GLUCOSE TEST test strip TEST THREE TIMES DAILY 300 strip 3   Vitamin  D, Ergocalciferol , (DRISDOL ) 1.25 MG (50000 UT) CAPS capsule Take 1 capsule (50,000 Units total) by mouth every 7 (seven) days. (Patient taking differently: Take 50,000 Units by mouth every 7 (seven) days. Thursdays) 4 capsule 0   No current facility-administered medications for this visit.    Family History  Problem Relation Age of Onset   Diabetes Mother 64       died - HTN   Stroke Mother    High blood pressure Mother    Sudden death Mother    Obesity Mother    Other Other        No early family hx of CAD    Social History   Socioeconomic History   Marital status: Legally Separated    Spouse name: Not on file   Number of children: 3   Years of education: 17   Highest education level: Not on file  Occupational History   Occupation: retired    Associate Professor: UNEMPLOYED  Tobacco Use   Smoking status: Never   Smokeless tobacco: Never  Vaping Use   Vaping status: Never Used  Substance and Sexual Activity   Alcohol use: No   Drug use: No   Sexual activity: Not Currently  Other Topics Concern   Not on file  Social History Narrative   Disabled Producer, television/film/video.  Currently taking sociology classes at [employer].   11/04/15 Lives with son    caffeine - coffee, 1-2 cups daily   Social Drivers of Health   Financial Resource Strain: Low Risk  (01/15/2022)   Overall Financial Resource Strain (CARDIA)    Difficulty of Paying Living Expenses: Not very hard  Food Insecurity: No Food Insecurity (04/17/2023)   Hunger Vital Sign    Worried About Running Out of Food in the Last Year: Never true    Ran Out of Food in the Last Year: Never true  Transportation Needs: No Transportation Needs (04/17/2023)   PRAPARE - Administrator, Civil Service (Medical): No    Lack of Transportation (Non-Medical): No  Physical Activity: Not on file  Stress: Not on file  Social Connections: Unknown (05/21/2021)   [facility] from [facility]   Social Network    Social Network: Not on file  Intimate  Partner Violence: Not At Risk (04/17/2023)   Humiliation, Afraid, Rape, and Kick questionnaire    Fear of Current or Ex-Partner: No    Emotionally Abused: No    Physically Abused: No    Sexually Abused: No     REVIEW OF SYSTEMS:   [X]  denotes positive finding, [ ]  denotes negative finding Cardiac  Comments:  Chest pain or chest pressure:    Shortness of breath upon exertion:    Short of breath when lying flat:    Irregular heart rhythm:        Vascular    Pain in calf, thigh, or hip brought on by ambulation:    Pain in feet at night that wakes you up from your sleep:     Blood clot in your veins:    Leg swelling:         Pulmonary    Oxygen at home:    Productive cough:  x  Wheezing:         Neurologic    Sudden weakness in arms or legs:     Sudden numbness in arms or legs:     Sudden onset of difficulty speaking or slurred speech:    Temporary loss of vision in one eye:     Problems with dizziness:         Gastrointestinal    Blood in stool:     Vomited blood:         Genitourinary    Burning when urinating:     Blood in urine:        Psychiatric    Major depression:         Hematologic    Bleeding problems:    Problems with blood clotting too easily:        Skin    Rashes or ulcers:        Constitutional    Fever or chills:      PHYSICAL EXAMINATION:  Today's Vitals   06/26/23 0815  BP: 119/61  Pulse: 61  Temp: 97.9 F (36.6 C)  TempSrc: Temporal  SpO2: 98%  Weight: 229 lb (103.9 kg)  Height: 5' 3 (1.6 m)  PainSc: 9   PainLoc: Knee   Body mass index is 40.57 kg/m.   General:  WDWN in NAD; vital signs documented above Gait: Not observed HENT: WNL, normocephalic Pulmonary: normal non-labored breathing , without wheezing Cardiac: regular HR, without carotid bruits Abdomen: soft, NT; aortic pulse is not palpable Skin: without rashes Vascular Exam/Pulses: Pt with monophasic left DP/PT/peroneal doppler flow Palpable bilateral radial  pulses Unable to examine right foot due to casting in place Extremities: without ischemic changes, without Gangrene , without cellulitis; without open wounds; swelling improved LLE with hemosiderin staining.  Musculoskeletal: no muscle wasting or atrophy  Neurologic: A&O X 3 Psychiatric:  The pt has Normal affect.   Non-Invasive Vascular Imaging:   ABI's/TBI's on 04/15/2023: Right:  0.66/0.48 - Great toe pressure: 89 Left:  1.39/0.42 - Great toe pressure: 77  Arterial duplex on 04/15/2023: +-----------+--------+-----+---------------+----------+--------+  RIGHT     PSV cm/sRatioStenosis       Waveform  Comments  +-----------+--------+-----+---------------+----------+--------+  CFA Distal 165          30-49% stenosisbiphasic            +-----------+--------+-----+---------------+----------+--------+  DFA       67                          biphasic            +-----------+--------+-----+---------------+----------+--------+  SFA Prox   168          30-49% stenosisbiphasic            +-----------+--------+-----+---------------+----------+--------+  SFA Mid    45                          biphasic            +-----------+--------+-----+---------------+----------+--------+  SFA Distal 93                          biphasic            +-----------+--------+-----+---------------+----------+--------+  POP Prox   90                          biphasic            +-----------+--------+-----+---------------+----------+--------+  POP Distal 218          50-74% stenosisbiphasic            +-----------+--------+-----+---------------+----------+--------+  TP Trunk   47                          biphasic            +-----------+--------+-----+---------------+----------+--------+  ATA Distal 84                          monophasic          +-----------+--------+-----+---------------+----------+--------+  PTA Distal 54                           monophasic          +-----------+--------+-----+---------------+----------+--------+  PERO Distal0            occluded       absent              +-----------+--------+-----+---------------+----------+--------+     +-----------+--------+-----+--------+----------+--------------+  LEFT      PSV cm/sRatioStenosisWaveform  Comments        +-----------+--------+-----+--------+----------+--------------+  CFA Distal 103                  biphasic                  +-----------+--------+-----+--------+----------+--------------+  DFA       125                  biphasic                  +-----------+--------+-----+--------+----------+--------------+  SFA Prox   131                  biphasic                  +-----------+--------+-----+--------+----------+--------------+  SFA Mid    106                  biphasic                  +-----------+--------+-----+--------+----------+--------------+  SFA Distal 89                   biphasic                  +-----------+--------+-----+--------+----------+--------------+  POP Prox   74                   biphasic                  +-----------+--------+-----+--------+----------+--------------+  POP Distal 67                   biphasic                  +-----------+--------+-----+--------+----------+--------------+  TP Trunk                                  not visualized  +-----------+--------+-----+--------+----------+--------------+  ATA Distal 0            occludedabsent                    +-----------+--------+-----+--------+----------+--------------+  PTA Distal 53  monophasic                +-----------+--------+-----+--------+----------+--------------+  PERO Distal0            occludedabsent                    +-----------+--------+-----+--------+----------+--------------+     Summary:  Right:  - Diffuse atherosclerotic changes noted  throughout the femoral-popliteal arteries with areas of 30-49% and 50-74% stenoses.  - No flow was observed at the anterior tibial artery distally.   Left:  - Diffuse atherosclerotic changes noted throughout the femoral-popliteal arteries with no evidence of significant stenosis.  - No flow was observed at the anterior tibial or peroneal arteries distally   Previous ABI's/TBI's on 02/02/2023: Right:  0.82/0.64 - Great toe pressure: 98 Left:  0.57/0.62 - Great toe pressure:  95    ASSESSMENT/PLAN:: 73 y.o. female here for follow up for PAD with hx of She was originally seen by Dr. Shaunna Delaware in 2022 for chronic venous insufficiency with hx of LLE intervention in the past, but he was unable to obtain records.  She has hx of BLE DVT's.  She has hx of stenting of left CIV and EIV September 2017 by IR.   PAD -ABI's have decreased from January, however, she reports her incisions on the right leg are healing and she denies any ulcerations or rest pain in the left foot.  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  Unable to examine right leg due to casting.   At that time, will also get left IVC/iliac vein duplex for stenting since this has been lost to follow up.  Stent most likely patent given minimal swelling in left leg.     Maryanna Smart, [facility] [phone]  Clinic MD:   Vikki Graves

## 2023-06-26 ENCOUNTER — Ambulatory Visit: Attending: Vascular Surgery | Admitting: Physician Assistant

## 2023-06-26 VITALS — BP 119/61 | HR 61 | Temp 97.9°F | Ht 63.0 in | Wt 229.0 lb

## 2023-06-26 DIAGNOSIS — I739 Peripheral vascular disease, unspecified: Secondary | ICD-10-CM | POA: Diagnosis not present

## 2023-06-26 DIAGNOSIS — I871 Compression of vein: Secondary | ICD-10-CM | POA: Diagnosis not present

## 2023-06-27 ENCOUNTER — Other Ambulatory Visit: Payer: Self-pay | Admitting: *Deleted

## 2023-06-27 DIAGNOSIS — I871 Compression of vein: Secondary | ICD-10-CM

## 2023-06-27 DIAGNOSIS — I739 Peripheral vascular disease, unspecified: Secondary | ICD-10-CM

## 2023-07-01 ENCOUNTER — Ambulatory Visit (INDEPENDENT_AMBULATORY_CARE_PROVIDER_SITE_OTHER): Payer: Medicare HMO

## 2023-07-01 DIAGNOSIS — I472 Ventricular tachycardia, unspecified: Secondary | ICD-10-CM

## 2023-07-01 DIAGNOSIS — I5042 Chronic combined systolic (congestive) and diastolic (congestive) heart failure: Secondary | ICD-10-CM

## 2023-07-02 LAB — CUP PACEART REMOTE DEVICE CHECK
Battery Remaining Longevity: 79 mo
Battery Remaining Percentage: 86 %
Battery Voltage: 3.05 V
Brady Statistic AP VP Percent: 88 %
Brady Statistic AP VS Percent: 2.6 %
Brady Statistic AS VP Percent: 6.5 %
Brady Statistic AS VS Percent: 1 %
Brady Statistic RA Percent Paced: 88 %
Date Time Interrogation Session: 20250623020025
HighPow Impedance: 41 Ohm
HighPow Impedance: 41 Ohm
Implantable Lead Connection Status: 753985
Implantable Lead Connection Status: 753985
Implantable Lead Connection Status: 753985
Implantable Lead Implant Date: 20080423
Implantable Lead Implant Date: 20140918
Implantable Lead Implant Date: 20241220
Implantable Lead Location: 753858
Implantable Lead Location: 753859
Implantable Lead Location: 753860
Implantable Lead Model: 5076
Implantable Pulse Generator Implant Date: 20241220
Lead Channel Impedance Value: 390 Ohm
Lead Channel Impedance Value: 460 Ohm
Lead Channel Impedance Value: 480 Ohm
Lead Channel Pacing Threshold Amplitude: 0.5 V
Lead Channel Pacing Threshold Amplitude: 0.75 V
Lead Channel Pacing Threshold Amplitude: 1 V
Lead Channel Pacing Threshold Pulse Width: 0.4 ms
Lead Channel Pacing Threshold Pulse Width: 0.4 ms
Lead Channel Pacing Threshold Pulse Width: 0.4 ms
Lead Channel Sensing Intrinsic Amplitude: 1 mV
Lead Channel Sensing Intrinsic Amplitude: 11.1 mV
Lead Channel Setting Pacing Amplitude: 1.5 V
Lead Channel Setting Pacing Amplitude: 2 V
Lead Channel Setting Pacing Amplitude: 2 V
Lead Channel Setting Pacing Pulse Width: 0.4 ms
Lead Channel Setting Pacing Pulse Width: 0.4 ms
Lead Channel Setting Sensing Sensitivity: 0.5 mV
Pulse Gen Serial Number: 5567048

## 2023-07-03 ENCOUNTER — Ambulatory Visit: Payer: Self-pay | Admitting: Cardiology

## 2023-07-14 ENCOUNTER — Other Ambulatory Visit: Payer: Self-pay | Admitting: Internal Medicine

## 2023-07-24 ENCOUNTER — Telehealth: Payer: Self-pay | Admitting: Podiatry

## 2023-07-24 ENCOUNTER — Other Ambulatory Visit (INDEPENDENT_AMBULATORY_CARE_PROVIDER_SITE_OTHER): Payer: Self-pay | Admitting: Podiatry

## 2023-07-24 DIAGNOSIS — M2142 Flat foot [pes planus] (acquired), left foot: Secondary | ICD-10-CM

## 2023-07-24 DIAGNOSIS — M2141 Flat foot [pes planus] (acquired), right foot: Secondary | ICD-10-CM

## 2023-07-24 DIAGNOSIS — E1151 Type 2 diabetes mellitus with diabetic peripheral angiopathy without gangrene: Secondary | ICD-10-CM

## 2023-07-24 NOTE — Telephone Encounter (Signed)
 Patient requesting referral for [facility] for diabetic shoes. Can we get one sent over for her?

## 2023-07-24 NOTE — Progress Notes (Signed)
 1. Type II diabetes mellitus with peripheral circulatory disorder (HCC)   2. Pes planus of both feet    Orders Placed This Encounter  Procedures   For home use only DME Other see comment    To [facility]:  Dispense one pair extra depth shoes and 3 pair total contact insoles.    Length of Need:   Lifetime

## 2023-07-31 ENCOUNTER — Ambulatory Visit: Admitting: Podiatry

## 2023-08-06 ENCOUNTER — Emergency Department (HOSPITAL_COMMUNITY)
Admission: EM | Admit: 2023-08-06 | Discharge: 2023-08-08 | Disposition: A | Discharge status: Home / Self Care | Attending: Emergency Medicine | Admitting: Emergency Medicine

## 2023-08-06 ENCOUNTER — Emergency Department (HOSPITAL_COMMUNITY)

## 2023-08-06 DIAGNOSIS — R531 Weakness: Secondary | ICD-10-CM | POA: Diagnosis not present

## 2023-08-06 DIAGNOSIS — Z794 Long term (current) use of insulin: Secondary | ICD-10-CM | POA: Insufficient documentation

## 2023-08-06 DIAGNOSIS — R29898 Other symptoms and signs involving the musculoskeletal system: Secondary | ICD-10-CM

## 2023-08-06 DIAGNOSIS — S8002XA Contusion of left knee, initial encounter: Secondary | ICD-10-CM | POA: Insufficient documentation

## 2023-08-06 DIAGNOSIS — Z7901 Long term (current) use of anticoagulants: Secondary | ICD-10-CM | POA: Insufficient documentation

## 2023-08-06 DIAGNOSIS — W1830XA Fall on same level, unspecified, initial encounter: Secondary | ICD-10-CM | POA: Diagnosis not present

## 2023-08-06 DIAGNOSIS — M25562 Pain in left knee: Secondary | ICD-10-CM | POA: Diagnosis present

## 2023-08-06 MED ORDER — EZETIMIBE 10 MG PO TABS
10.0000 mg | ORAL_TABLET | Freq: Every day | ORAL | Status: DC
  Administered 2023-08-07 – 2023-08-08 (×2): 10 mg via ORAL
  Filled 2023-08-06 (×2): qty 1

## 2023-08-06 MED ORDER — ACETAMINOPHEN 325 MG PO TABS
650.0000 mg | ORAL_TABLET | Freq: Once | ORAL | Status: AC
  Administered 2023-08-06: 650 mg via ORAL
  Filled 2023-08-06: qty 2

## 2023-08-06 MED ORDER — APIXABAN 5 MG PO TABS
5.0000 mg | ORAL_TABLET | Freq: Two times a day (BID) | ORAL | Status: DC
  Administered 2023-08-06 – 2023-08-08 (×4): 5 mg via ORAL
  Filled 2023-08-06 (×4): qty 1

## 2023-08-06 MED ORDER — OXYCODONE HCL 5 MG PO TABS
5.0000 mg | ORAL_TABLET | ORAL | Status: DC | PRN
  Administered 2023-08-06 – 2023-08-08 (×3): 5 mg via ORAL
  Filled 2023-08-06 (×3): qty 1

## 2023-08-06 MED ORDER — DAPAGLIFLOZIN PROPANEDIOL 10 MG PO TABS
10.0000 mg | ORAL_TABLET | Freq: Every day | ORAL | Status: DC
  Administered 2023-08-07 – 2023-08-08 (×2): 10 mg via ORAL
  Filled 2023-08-06 (×2): qty 1

## 2023-08-06 MED ORDER — AMIODARONE HCL 100 MG PO TABS
100.0000 mg | ORAL_TABLET | Freq: Every day | ORAL | Status: DC
  Administered 2023-08-07 – 2023-08-08 (×2): 100 mg via ORAL
  Filled 2023-08-06 (×2): qty 1

## 2023-08-06 MED ORDER — GABAPENTIN 300 MG PO CAPS
300.0000 mg | ORAL_CAPSULE | Freq: Three times a day (TID) | ORAL | Status: DC
  Administered 2023-08-06 – 2023-08-08 (×5): 300 mg via ORAL
  Filled 2023-08-06 (×5): qty 1

## 2023-08-06 NOTE — ED Triage Notes (Addendum)
 Per EMS from home. C/o weakness. Had to sit down on the floor because did not have strength to make it back to chair from restroom. Takes Eliquis . Does not feel safe at home because of mobility issues. Recent stay at [facility]. Wants to be readmitted to [facility].  BP 136/40 HR 80 RR 16 96 on RA CBG 254

## 2023-08-06 NOTE — Progress Notes (Signed)
 PT consult pending. Patient requesting to go back to [facility] rehab. CSW unable to verify with admissions at [facility] on whether or not patient has SNF days left and if she can return.

## 2023-08-06 NOTE — ED Provider Notes (Signed)
 [place] EMERGENCY DEPARTMENT AT [facility] Provider Note   CSN: [id number] Arrival date & time: 08/06/23  1650     Patient presents with: Weakness   [name] is a 73 y.o. female who has a primary complaint of lower extremity weakness.  She was discharged from rehab this morning for physical therapy after having surgery on her right ankle.  Noted to have previously had a distal tibial/fibular fracture with lateral translation of the talus and surgical repair of the same.  This is back in April of this year.  States that she was walking at home with the help of a home aide when she had a ground-level fall, describes it more as a assistance to the ground while seated.  Stated that she feels that she has pain in the left knee and down the left calf as this is the leg but has been bearing majority of her weight since injuring her right foot.  No reports of any injuries related to incidents that have occurred since she has returned home today, was on the left knee for prolonged amount of time as she was trying to help herself back up from the floor.  Does complain of tenderness to the inferior anterior left knee.    Weakness Associated symptoms: arthralgias        Prior to Admission medications   Medication Sig Start Date End Date Taking? Authorizing Provider  ACCU-CHEK GUIDE TEST test strip TEST BLOOD SUGAR THREE TIMES DAILY 04/25/23   Shamleffer, Ibtehal Jaralla, MD  albuterol  (VENTOLIN  HFA) 108 (90 Base) MCG/ACT inhaler Inhale 2 puffs into the lungs 2 (two) times daily as needed for wheezing or shortness of breath.    [provider]  Alcohol Swabs  (DROPSAFE ALCOHOL PREP) 70 % PADS Apply topically. 06/06/22   [provider]  amiodarone  (PACERONE ) 100 MG tablet Take 100 mg by mouth daily. 04/23/23   [provider]  amiodarone  (PACERONE ) 200 MG tablet TAKE 1/2 TABLET EVERY DAY 06/10/23   Jeffrie Oneil BROCKS, MD  apixaban  (ELIQUIS ) 5 MG TABS tablet TAKE  1 TABLET TWICE DAILY 11/14/22   Sheree Penne Bruckner, MD  calcitonin, salmon, (MIACALCIN /FORTICAL) 200 UNIT/ACT nasal spray Place 1 spray into alternate nostrils daily. 10/12/22   [provider]  cephALEXin  (KEFLEX ) 500 MG capsule Take 500 mg by mouth 2 (two) times daily. 05/11/23   [provider]  Continuous Glucose Sensor (DEXCOM G7 SENSOR) MISC 1 Device by Does not apply route as directed. 01/31/23   Shamleffer, Ibtehal Jaralla, MD  cyanocobalamin (,VITAMIN B-12,) 1000 MCG/ML injection Inject 1,000 mcg into the muscle every 30 (thirty) days.    [provider]  ENTRESTO  24-26 MG TAKE 1 TABLET TWICE DAILY 11/26/22   Jeffrie Oneil BROCKS, MD  ezetimibe  (ZETIA ) 10 MG tablet Take 1 tablet (10 mg total) by mouth daily. 01/15/23   Jeffrie Oneil BROCKS, MD  FARXIGA  10 MG TABS tablet TAKE 1 TABLET EVERY DAY 07/15/23   Shamleffer, Ibtehal Jaralla, MD  ferrous sulfate  325 (65 FE) MG EC tablet Take 325 mg by mouth daily with breakfast.    [provider]  gabapentin  (NEURONTIN ) 300 MG capsule Take 300 mg by mouth 3 (three) times daily. 01/15/23   [provider]  glucose blood (ACCU-CHEK GUIDE) test strip Check 3 times daily 02/22/22   Shamleffer, Ibtehal Jaralla, MD  Insulin  Pen Needle 31G X 5 MM MISC 1 Device by Does not apply route in the morning, at noon, in the  evening, and at bedtime. 11/03/21   Shamleffer, Ibtehal Jaralla, MD  ketoconazole (NIZORAL) 2 % cream Apply 1 Application topically daily as needed for irritation. 03/25/23   [provider]  latanoprost  (XALATAN ) 0.005 % ophthalmic solution Place 1 drop into both eyes at bedtime.    [provider]  nitroGLYCERIN  (NITROSTAT ) 0.4 MG SL tablet PLACE 1 TABLET (0.4 MG TOTAL) UNDER THE TONGUE EVERY 5 (FIVE) MINUTES AS NEEDED FOR CHEST PAIN. 11/26/22   Jeffrie Oneil BROCKS, MD  NOVOLOG  FLEXPEN 100 UNIT/ML FlexPen INJECT PER SLIDING SCALE AS DIRECTED , MAX 30 UNITS PER DAY. DISCARD PEN 28 DAYS AFTER OPENING 06/19/23    Shamleffer, Donell Cardinal, MD  oxyCODONE  (OXY IR/ROXICODONE ) 5 MG immediate release tablet Take 1-2 tablets (5-10 mg total) by mouth every 4 (four) hours as needed for severe pain (pain score 7-10). 04/23/23   Gherghe, Costin M, MD  OXYGEN Inhale 2 L into the lungs continuous.    [provider]  rosuvastatin  (CRESTOR ) 20 MG tablet Take 1 tablet (20 mg total) by mouth daily at 6 PM. 10/07/15   Daryl Cough, MD  TRESIBA  FLEXTOUCH 100 UNIT/ML FlexTouch Pen INJECT 30 UNITS INTO THE SKIN DAILY. 11/29/22   Shamleffer, Ibtehal Jaralla, MD  TRUE METRIX BLOOD GLUCOSE TEST test strip TEST THREE TIMES DAILY 12/11/21   Shamleffer, Ibtehal Jaralla, MD  Vitamin D , Ergocalciferol , (DRISDOL ) 1.25 MG (50000 UT) CAPS capsule Take 1 capsule (50,000 Units total) by mouth every 7 (seven) days. Patient taking differently: Take 50,000 Units by mouth every 7 (seven) days. Thursdays 12/30/18   Delores Shields A, DO    Allergies: Metformin and related, Ozempic  (0.25 or 0.5 mg-dose) [semaglutide (0.25 or 0.5mg -dos)], Atorvastatin  calcium , and Levofloxacin     Review of Systems  Musculoskeletal:  Positive for arthralgias.  Neurological:  Positive for weakness.  All other systems reviewed and are negative.   Updated Vital Signs BP (!) 150/71 (BP Location: Right Arm)   Pulse 71   Temp 98.8 F (37.1 C) (Oral)   Resp 18   SpO2 99%   Physical Exam Vitals and nursing note reviewed.  Constitutional:      General: She is not in acute distress.    Appearance: She is well-developed.  HENT:     Head: Normocephalic and atraumatic.  Eyes:     Conjunctiva/sclera: Conjunctivae normal.  Cardiovascular:     Rate and Rhythm: Normal rate and regular rhythm.     Heart sounds: No murmur heard. Pulmonary:     Effort: Pulmonary effort is normal. No respiratory distress.     Breath sounds: Normal breath sounds.  Abdominal:     Palpations: Abdomen is soft.     Tenderness: There is no abdominal tenderness.   Musculoskeletal:        General: No swelling.     Cervical back: Neck supple.  Skin:    General: Skin is warm and dry.     Capillary Refill: Capillary refill takes less than 2 seconds.  Neurological:     General: No focal deficit present.     Mental Status: She is alert and oriented to person, place, and time. Mental status is at baseline.     GCS: GCS eye subscore is 4. GCS verbal subscore is 5. GCS motor subscore is 6.     Cranial Nerves: No cranial nerve deficit.     Sensory: Sensation is intact.     Motor: Motor function is intact.     Coordination: Coordination is intact.  Psychiatric:  Mood and Affect: Mood normal.     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 No joint effusion is seen. No acute fracture or dislocation is noted. No soft tissue abnormality is seen. IMPRESSION: Degenerative change without acute abnormality. Electronically Signed   By: Oneil Devonshire M.D.   On: 08/06/2023 19:45     Procedures   Medications Ordered in the ED  acetaminophen  (TYLENOL ) tablet 650 mg (650 mg Oral Given 08/06/23 1939)                                    Medical Decision Making Amount and/or Complexity of Data Reviewed Radiology: ordered.  Risk OTC drugs.   Medical Decision Making:   Samayra Hebel is a 73 y.o. female who presented to the ED today with lower extremity weakness and left knee discomfort detailed above.     Complete initial physical exam performed, notably the patient  was alert and oriented in no apparent distress.  Has tenderness elicited to the medial and superior anterior left knee, ecchymosis noted to the  inferior patella.  Primary reason for seeking ED evaluation today is for readmission to inpatient rehab for her continued physical therapy.  Concerned she may have another fall at home if not granted admission.  Reviewed and confirmed nursing documentation for past medical history, family history, social history.    Initial Assessment:   With the patient's presentation of lower extremity weakness, most likely diagnosis is continued weakness secondary to previous musculoskeletal injuries.  Also consider bony injury to the left knee secondary to pain noted on exam. Initial Plan:  Plain film imaging of the left knee to rule out bony injury to left knee Based on evaluation of this patient and patient's complaints/history of present illness, defer further lab workup at this time. Objective evaluation as below reviewed   Initial Study Results:   Radiology:  All images reviewed independently. Agree with radiology report at this time.   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 No joint effusion is seen. No acute fracture or dislocation is noted. No soft tissue abnormality is seen. IMPRESSION: Degenerative change without acute abnormality. Electronically Signed   By: Oneil Devonshire M.D.   On: 08/06/2023 19:45      Consults: Case discussed with clinical social worker team.   Reassessment and Plan:   Consulted with clinical social worker who is recommended that patient be reevaluated by physical therapy, will be placed in TOC boarder status, and they will follow in the morning to contact admissions regarding her request for readmission for inpatient rehabilitation.  Discussed this with the patient, at this time she will remain in TLC border status, and will have physical therapy and clinical social  work team following her in the morning.       Final diagnoses:  None    ED Discharge Orders     None  Myriam Dorn BROCKS, PA 08/06/23 2213    Suzette Pac, MD 08/07/23 1325

## 2023-08-07 DIAGNOSIS — S8002XA Contusion of left knee, initial encounter: Secondary | ICD-10-CM | POA: Diagnosis not present

## 2023-08-07 LAB — CBG MONITORING, ED: Glucose-Capillary: 182 mg/dL — ABNORMAL HIGH (ref 70–99)

## 2023-08-07 MED ORDER — ACETAMINOPHEN 325 MG PO TABS
650.0000 mg | ORAL_TABLET | Freq: Once | ORAL | Status: AC
  Administered 2023-08-07: 650 mg via ORAL
  Filled 2023-08-07: qty 2

## 2023-08-07 NOTE — ED Provider Notes (Signed)
 Emergency Medicine Observation Re-evaluation Note  [name] is a 73 y.o. female, seen on rounds today.  Pt initially presented to the ED for complaints of Weakness Currently, the patient is resting.  Physical Exam  BP (!) 140/66 (BP Location: Right Arm)   Pulse 63   Temp 98.6 F (37 C) (Oral)   Resp 18   SpO2 96%  Physical Exam  EKG:   I have reviewed the labs performed to date as well as medications administered while in observation.  Recent changes in the last 24 hours include none.  Plan  Current plan is for waiting for PT to evaluate patient and social worker to see if she can go back to rehab.    Dasie Faden, MD 08/07/23 [phone]

## 2023-08-07 NOTE — Evaluation (Signed)
 Physical Therapy Evaluation Patient Details Name: [name] MRN: [id number] DOB: 1950-06-14 Today's Date: 08/07/2023  History of Present Illness  73 year old female brought to  ED 08/06/23 after ground level fall in her home on the same day she was DC'd from SNF following Right  ankle ORIF, negative left knee injury. PMH: chronic combined systolic CHF, defibrillator in place, CAD status post CABG, DM2, PAD, history of DVT , R ankle ORIF 4/25.  Clinical Impression  Pt admitted with above diagnosis.  Pt currently with functional limitations due to the deficits listed below (see PT Problem List). Pt will benefit from acute skilled PT to increase their independence and safety with mobility to allow discharge.       The patient is alert and cooperative with  therapy to mobilize.  Patient reports that she  was  DC'd from SNF after ` 3 months  recovering from R ankle ORIF. Patient reports that she was using her 2 Wheeled  Rw with PCA present, felt weak and  could not sit down so went to ground onto left KNee. Patient reports she usually uses her /rollator where she can turn and sit down.  Patient did stand and was able to take a few steps using the RW with +2 assist  for safety. Patient will benefit from continued inpatient follow up therapy, <3 hours/day     If plan is discharge home, recommend the following: A lot of help with walking and/or transfers;Assistance with cooking/housework;A lot of help with bathing/dressing/bathroom;Assist for transportation;Help with stairs or ramp for entrance   Can travel by private vehicle   No    Equipment Recommendations None recommended by PT  Recommendations for Other Services       Functional Status Assessment Patient has had a recent decline in their functional status and demonstrates the ability to make significant improvements in function in a reasonable and predictable amount of time.     Precautions / Restrictions Precautions Precautions:  Fall Required Braces or Orthoses: Splint/Cast Splint/Cast: Cam boot Restrictions Weight Bearing Restrictions Per Provider Order: No Other Position/Activity Restrictions: pt left SNF 7/29 ambulatory with RW, so assume WBAT      Mobility  Bed Mobility Overal bed mobility: Needs Assistance Bed Mobility: Supine to Sit, Sit to Supine     Supine to sit: Supervision, HOB elevated, Used rails Sit to supine: Min assist   General bed mobility comments: patient required no extra support to mobilize to sitting onto bed edge, assisted R leg onto bed.    Transfers Overall transfer level: Needs assistance Equipment used: Rolling walker (2 wheels) Transfers: Sit to/from Stand Sit to Stand: Min assist, +2 safety/equipment, From elevated surface           General transfer comment: Patient able to stand at bed side at RW, with right Cam and left shoe.    Ambulation/Gait Ambulation/Gait assistance: Mod assist, +2 safety/equipment, +2 physical assistance   Assistive device: Rolling walker (2 wheels) Gait Pattern/deviations: Step-to pattern       General Gait Details: side step  x 5 steps along bed edge to get up to [facility].  Stairs            Wheelchair Mobility     Tilt Bed    Modified Rankin (Stroke Patients Only)       Balance Overall balance assessment: Needs assistance Sitting-balance support: Feet supported, No upper extremity supported Sitting balance-Leahy Scale: Good     Standing balance support: During functional activity, Bilateral upper  extremity supported, Reliant on assistive device for balance Standing balance-Leahy Scale: Poor                               Pertinent Vitals/Pain Pain Assessment Pain Assessment: Faces Faces Pain Scale: Hurts little more Pain Location: left knee Pain Descriptors / Indicators: Discomfort Pain Intervention(s): Monitored during session, Premedicated before session    Home Living Family/patient expects to  be discharged to:: Private residence Living Arrangements: Children Available Help at Discharge: Family;Available PRN/intermittently Type of Home: House Home Access: Ramped entrance       Home Layout: One level Home Equipment: Rollator (4 wheels);Shower seat;Other (comment);Rolling Walker (2 wheels) Additional Comments: pt just DC from [facility]  7/29 and fell    Prior Function               Mobility Comments: pt uses rollater for mobility, was using 2 W Rw when fall occured, got weak and slid to floor onto left knee and could not get up. ADLs Comments: has aide 5 days/wk 4 hours and 2 hours on weekend to assist with LB bathing, LB dressing, meals, home mgt     Extremity/Trunk Assessment   Upper Extremity Assessment Upper Extremity Assessment: Overall WFL for tasks assessed    Lower Extremity Assessment Lower Extremity Assessment: RLE deficits/detail;LLE deficits/detail RLE Deficits / Details: limited ankle ROM(ORIF) LLE Sensation: WNL LLE Coordination: WNL    Cervical / Trunk Assessment Cervical / Trunk Assessment: Normal;Other exceptions Cervical / Trunk Exceptions: body habitus  Communication   Communication Communication: No apparent difficulties    Cognition Arousal: Alert Behavior During Therapy: WFL for tasks assessed/performed   PT - Cognitive impairments: No apparent impairments                         Following commands: Intact       Cueing       General Comments      Exercises     Assessment/Plan    PT Assessment Patient needs continued PT services  PT Problem List Decreased strength;Decreased range of motion;Decreased activity tolerance;Decreased mobility;Decreased balance;Obesity;Pain       PT Treatment Interventions DME instruction;Therapeutic exercise;Gait training;Functional mobility training;Therapeutic activities;Patient/family education    PT Goals (Current goals can be found in the Care Plan section)  Acute Rehab PT  Goals Patient Stated Goal: go back to [facility] PT Goal Formulation: With patient Time For Goal Achievement: 08/21/23 Potential to Achieve Goals: Fair    Frequency Min 2X/week     Co-evaluation               AM-PAC PT 6 Clicks Mobility  Outcome Measure Help needed turning from your back to your side while in a flat bed without using bedrails?: A Little Help needed moving from lying on your back to sitting on the side of a flat bed without using bedrails?: A Little Help needed moving to and from a bed to a chair (including a wheelchair)?: A Little Help needed standing up from a chair using your arms (e.g., wheelchair or bedside chair)?: A Lot Help needed to walk in hospital room?: Total Help needed climbing 3-5 steps with a railing? : Total 6 Click Score: 13    End of Session Equipment Utilized During Treatment: Gait belt;Other (comment) (CAM boot on R) Activity Tolerance: Patient tolerated treatment well Patient left: in bed;with call bell/phone within reach;with bed alarm set Nurse Communication:  Mobility status PT Visit Diagnosis: Unsteadiness on feet (R26.81);History of falling (Z91.81);Difficulty in walking, not elsewhere classified (R26.2);Muscle weakness (generalized) (M62.81)    Time: 9094-9078 PT Time Calculation (min) (ACUTE ONLY): 16 min   Charges:   PT Evaluation $PT Eval Low Complexity: 1 Low   PT General Charges $$ ACUTE PT VISIT: 1 Visit         Darice Potters PT Acute Rehabilitation Services Office [phone]   Potters Darice Norris 08/07/2023, 10:24 AM

## 2023-08-07 NOTE — Addendum Note (Signed)
 Addended by: TAWNI DRILLING D on: 08/07/2023 01:21 PM   Modules accepted: Orders

## 2023-08-07 NOTE — [place] FL2 (Signed)
 [place]  MEDICAID FL2 LEVEL OF CARE FORM     IDENTIFICATION  Patient Name: [name] Birthdate: 17-Aug-1950 Sex: female Admission Date (Current Location): 08/06/2023  [place] and [place] Number:  [place] [id number] Facility and Address:  [facility],  [address], [place] [address]      Provider Number: [id number]  Attending Physician Name and Address:  System, Provider Not In  Relative Name and Phone Number:  Jelene, [facility])  [phone] (Mobile)    Current Level of Care: Hospital Recommended Level of Care: Skilled Nursing Facility Prior Approval Number:    Date Approved/Denied:   PASRR Number: [id number]  Discharge Plan: SNF    Current Diagnoses: Patient Active Problem List   Diagnosis Date Noted   Hypokalemia 04/16/2023   Ankle fracture 04/15/2023   Pacemaker 12/28/2022   Depressed left ventricular ejection fraction 04/30/2022   Acute cystitis 04/27/2022   Anxiety and depression 04/27/2022   Ventricular tachyarrhythmia (HCC) 01/12/2022   Pain in left foot 11/15/2021   Acute metabolic acidosis 10/31/2021   Vaginal bleeding, abnormal 10/30/2021   Fibroid uterus 10/30/2021   Chronic hypoxemic respiratory failure (HCC) 10/30/2021   Acute kidney injury (HCC) 10/29/2021   Nausea, vomiting, and diarrhea 08/16/2021   COVID-19 virus infection 08/16/2021   History of DVT (deep vein thrombosis) 08/16/2021   QT prolongation 08/16/2021   Right ovarian cyst 08/16/2021   Elevated AST (SGOT) 08/16/2021   Preop cardiovascular exam 02/10/2021   Adnexal mass 12/23/2020   Polyneuropathy associated with underlying disease (HCC) 09/01/2020   Acute pansinusitis 03/18/2020   Diabetic peripheral neuropathy (HCC) 03/18/2020   Urine finding 03/18/2020   Aortic atherosclerosis (HCC) 01/13/2020   Diabetes mellitus with coincident hypertension (HCC) 01/13/2020   Type 2 diabetes mellitus with diabetic polyneuropathy, with long-term current use of  insulin  (HCC) 07/16/2019   Type 2 diabetes mellitus with hyperglycemia, with long-term current use of insulin  (HCC) 04/20/2019   Diabetes mellitus (HCC) 04/20/2019   Uncontrolled diabetes mellitus 02/20/2019   Pressure injury of skin 02/20/2019   Demand ischemia (HCC)    Abnormal LFTs    Acute pancreatitis 01/12/2019   DKA (diabetic ketoacidosis) (HCC) 01/12/2019   Vitamin D  deficiency 11/05/2018   Vitamin B 12 deficiency 11/05/2018   Osteoarthritis of knee 07/02/2018   Tinea    Sleep apnea    Oxygen dependent    Obesity    Insomnia    Hyperlipidemia    Dermal mycosis    Depression    Coronary artery disease    Arthritis    Anxiety    ICD (implantable cardioverter-defibrillator) in place    Heme positive stool    Benign neoplasm of transverse colon    Bundle branch block 05/08/2016   Left leg pain    DVT (deep venous thrombosis) (HCC) 09/30/2015   Syncope 03/11/2015   AKI (acute kidney injury) (HCC) 03/11/2015   Hypotension 03/11/2015   Syncope and collapse 03/11/2015   Ventricular tachycardia (HCC) 01/17/2015   Pain in the chest    SOB (shortness of breath)    Thyroid  nodule    CAP (community acquired pneumonia)    Chest pain 05/26/2014   Right flank pain 05/26/2014   Right flank discomfort    Chronic combined systolic and diastolic heart failure (HCC)    Angina pectoris (HCC)    Morbid obesity (HCC) 11/06/2012   Angina decubitus (HCC) 11/06/2012   LBBB (left bundle branch block) 09/24/2012   Chronic systolic dysfunction of left ventricle 09/24/2012  Hypoglycemia 09/09/2012   HLD (hyperlipidemia) 04/23/2012   Insulin  dependent diabetes mellitus (HCC) 04/23/2012   Acute kidney injury superimposed on chronic kidney disease (HCC) 03/10/2012   Ischemic cardiomyopathy 06/18/2010   CAD (coronary artery disease) 06/18/2010   Hypertension 06/18/2010   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    Orientation RESPIRATION BLADDER Height & Weight     Self, Time, Situation, Place  O2  (2 LPM) Continent Weight:   Height:     BEHAVIORAL SYMPTOMS/MOOD NEUROLOGICAL BOWEL NUTRITION STATUS      Continent Diet  AMBULATORY STATUS COMMUNICATION OF NEEDS Skin   Supervision Verbally Normal                       Personal Care Assistance Level of Assistance  Bathing, Feeding, Dressing Bathing Assistance: Limited assistance Feeding assistance: Limited assistance Dressing Assistance: Limited assistance     Functional Limitations Info  Sight          SPECIAL CARE FACTORS FREQUENCY  PT (By licensed PT), OT (By licensed OT)     PT Frequency: 5X week OT Frequency: 5X week            Contractures Contractures Info: Not present    Additional Factors Info  Code Status, Allergies Code Status Info: Full Allergies Info: Metformin And Related, Ozempic  (0.25 Or 0.5 Mg-dose) (Semaglutide (0.25 Or 0.5mg -dos)), Atorvastatin  Calcium , Levofloxacin            Current Medications (08/07/2023):  This is the current hospital active medication list Current Facility-Administered Medications  Medication Dose Route Frequency Provider Last Rate Last Admin   amiodarone  (PACERONE ) tablet 100 mg  100 mg Oral Daily Logan Martinis B, PA-C   100 mg at 08/07/23 0912   apixaban  (ELIQUIS ) tablet 5 mg  5 mg Oral BID Logan Martinis B, PA-C   5 mg at 08/07/23 9088   dapagliflozin  propanediol (FARXIGA ) tablet 10 mg  10 mg Oral QPC breakfast Logan Martinis NOVAK, PA-C   10 mg at 08/07/23 0912   ezetimibe  (ZETIA ) tablet 10 mg  10 mg Oral Daily Logan Martinis NOVAK, PA-C   10 mg at 08/07/23 0911   gabapentin  (NEURONTIN ) capsule 300 mg  300 mg Oral TID Logan Martinis B, PA-C   300 mg at 08/07/23 0911   oxyCODONE  (Oxy IR/ROXICODONE ) immediate release tablet 5-10 mg  5-10 mg Oral Q4H PRN Logan Martinis NOVAK, PA-C   5 mg at 08/06/23 2314   Current Outpatient Medications  Medication Sig Dispense Refill   ciprofloxacin (CIPRO) 250 MG tablet Take 250 mg by mouth 2 (two) times daily.     HUMALOG 100 UNIT/ML  injection Inject into the skin.     TRADJENTA 5 MG TABS tablet Take 5 mg by mouth daily.     ACCU-CHEK GUIDE TEST test strip TEST BLOOD SUGAR THREE TIMES DAILY 300 strip 3   albuterol  (VENTOLIN  HFA) 108 (90 Base) MCG/ACT inhaler Inhale 2 puffs into the lungs 2 (two) times daily as needed for wheezing or shortness of breath.     Alcohol Swabs  (DROPSAFE ALCOHOL PREP) 70 % PADS Apply topically.     amiodarone  (PACERONE ) 100 MG tablet Take 100 mg by mouth daily.     amiodarone  (PACERONE ) 200 MG tablet TAKE 1/2 TABLET EVERY DAY 45 tablet 3   apixaban  (ELIQUIS ) 5 MG TABS tablet TAKE 1 TABLET TWICE DAILY 180 tablet 3   calcitonin, salmon, (MIACALCIN /FORTICAL) 200 UNIT/ACT nasal spray Place 1 spray into alternate nostrils daily.  cephALEXin  (KEFLEX ) 500 MG capsule Take 500 mg by mouth 2 (two) times daily.     Continuous Glucose Sensor (DEXCOM G7 SENSOR) MISC 1 Device by Does not apply route as directed. 9 each 3   cyanocobalamin (,VITAMIN B-12,) 1000 MCG/ML injection Inject 1,000 mcg into the muscle every 30 (thirty) days.     ENTRESTO  24-26 MG TAKE 1 TABLET TWICE DAILY 180 tablet 3   ezetimibe  (ZETIA ) 10 MG tablet Take 1 tablet (10 mg total) by mouth daily. 90 tablet 3   FARXIGA  10 MG TABS tablet TAKE 1 TABLET EVERY DAY 90 tablet 3   ferrous sulfate  325 (65 FE) MG EC tablet Take 325 mg by mouth daily with breakfast.     gabapentin  (NEURONTIN ) 300 MG capsule Take 300 mg by mouth 3 (three) times daily.     glucose blood (ACCU-CHEK GUIDE) test strip Check 3 times daily 100 each 12   Insulin  Pen Needle 31G X 5 MM MISC 1 Device by Does not apply route in the morning, at noon, in the evening, and at bedtime. 400 each 3   ketoconazole (NIZORAL) 2 % cream Apply 1 Application topically daily as needed for irritation.     latanoprost  (XALATAN ) 0.005 % ophthalmic solution Place 1 drop into both eyes at bedtime.     nitroGLYCERIN  (NITROSTAT ) 0.4 MG SL tablet PLACE 1 TABLET (0.4 MG TOTAL) UNDER THE TONGUE EVERY  5 (FIVE) MINUTES AS NEEDED FOR CHEST PAIN. 75 tablet 3   NOVOLOG  FLEXPEN 100 UNIT/ML FlexPen INJECT PER SLIDING SCALE AS DIRECTED , MAX 30 UNITS PER DAY. DISCARD PEN 28 DAYS AFTER OPENING 30 mL 3   oxyCODONE  (OXY IR/ROXICODONE ) 5 MG immediate release tablet Take 1-2 tablets (5-10 mg total) by mouth every 4 (four) hours as needed for severe pain (pain score 7-10). 10 tablet 0   OXYGEN Inhale 2 L into the lungs continuous.     rosuvastatin  (CRESTOR ) 20 MG tablet Take 1 tablet (20 mg total) by mouth daily at 6 PM. 30 tablet 2   TRESIBA  FLEXTOUCH 100 UNIT/ML FlexTouch Pen INJECT 30 UNITS INTO THE SKIN DAILY. 30 mL 3   TRUE METRIX BLOOD GLUCOSE TEST test strip TEST THREE TIMES DAILY 300 strip 3   Vitamin D , Ergocalciferol , (DRISDOL ) 1.25 MG (50000 UT) CAPS capsule Take 1 capsule (50,000 Units total) by mouth every 7 (seven) days. (Patient taking differently: Take 50,000 Units by mouth every 7 (seven) days. Thursdays) 4 capsule 0     Discharge Medications: Please see discharge summary for a list of discharge medications.  Relevant Imaging Results:  Relevant Lab Results:   Additional Information    Daymen Hassebrock I Eben, LCSW

## 2023-08-07 NOTE — Progress Notes (Signed)
 Remote ICD transmission.

## 2023-08-07 NOTE — Progress Notes (Signed)
 CSW spoke with pt to present bed offer from [facility], Pt accepted offer.  Auth started for SNF, ICM spoke with Star at [facility] and shared that pt accepts offer. Auth pending . ICM pending.

## 2023-08-08 DIAGNOSIS — S8002XA Contusion of left knee, initial encounter: Secondary | ICD-10-CM | POA: Diagnosis not present

## 2023-08-08 NOTE — Progress Notes (Signed)
 Auth approved. EDP and RN notified via secure chat. [facility] to transport. Room/report info given to RN.

## 2023-08-08 NOTE — ED Notes (Signed)
Report called to [facility].  ?

## 2023-08-08 NOTE — Progress Notes (Signed)
 Auth still pending

## 2023-08-08 NOTE — ED Provider Notes (Signed)
 Emergency Medicine Observation Re-evaluation Note  [name] is a 73 y.o. female, seen on rounds today.  Pt initially presented to the ED for complaints of Weakness Currently, the patient is sleeping.  Physical Exam  BP (!) 146/64 (BP Location: Left Arm)   Pulse 66   Temp 98.2 F (36.8 C) (Oral)   Resp 18   SpO2 100%  Physical Exam General: nad Cardiac: regular rate Lungs: normal effort Psych: deferred  ED Course / MDM  EKG:   I have reviewed the labs performed to date as well as medications administered while in observation.  Recent changes in the last 24 hours include no events overnight.  Plan  Current plan is for placement at SNF.    Randol Simmonds, MD 08/08/23 [phone]

## 2023-08-08 NOTE — Discharge Instructions (Addendum)
Continue your current medications.  Follow-up with your doctor to be rechecked 

## 2023-08-12 ENCOUNTER — Ambulatory Visit: Attending: Cardiology

## 2023-08-12 DIAGNOSIS — I5042 Chronic combined systolic (congestive) and diastolic (congestive) heart failure: Secondary | ICD-10-CM | POA: Diagnosis not present

## 2023-08-12 DIAGNOSIS — Z95810 Presence of automatic (implantable) cardiac defibrillator: Secondary | ICD-10-CM

## 2023-08-13 ENCOUNTER — Telehealth: Payer: Self-pay

## 2023-08-13 NOTE — Telephone Encounter (Signed)
 Spoke with patient and remote transmission received.  See ICM note for results.

## 2023-08-13 NOTE — Progress Notes (Signed)
 EPIC Encounter for ICM Monitoring  Patient Name: [name] is a 73 y.o. female Date: 08/13/2023 Primary Care Physican: Cristopher Suzen HERO, NP Primary Cardiologist: Skains/Weaver PA  Electrophysiologist: Cindie BiV Pacing: 95% 04/16/2022 Weight: 230 lbs 05/15/2022 Weight:  230 lbs 08/29/2022 Weight: 230 lbs 11/21/2022 Office Weight: 237 lbs 02/18/2023 Weight: 230 lbs  04/03/2023 Office Weight: 230 lbs 06/25/2023 Weight: 234 lbs lbs    AT/AF Burden: 0% (taking Eliquis )                                                           Spoke with patient and heart failure questions reviewed.  Transmission results reviewed.  Pt asymptomatic for fluid accumulation.  She continues to be a patient in inpatient rehab facility due to is non weight bearing on her foot.    CorVue thoracic impedance suggesting normal fluid levels with the exception of possible fluid accumulation from 7/25-7/31.      Prescribed: No diuretic   Labs: 04/20/2023 Creatinine 1.02, BUN 25, Potassium 4.6, Sodium 137, GFR 58  04/19/2023 Creatinine 1.01, BUN 21, Potassium 4.4, Sodium 135, GFR 59  04/18/2023 Creatinine 1.12, BUN 20, Potassium 4.3, Sodium 137, GFR 52  04/17/2023 Creatinine 1.14, BUN 18, Potassium 4.7, Sodium 134, GFR 51 04/16/2023 Creatinine 0.82, BUN 21, Potassium 4.0, Sodium 140, GFR >60  04/15/2023 Creatinine 0.79, BUN 19, Potassium 3.1, Sodium 144, GFR >60  A complete set of results can be found in Results Review.   Recommendations:  No changes and encouraged to call if experiencing any fluid symptoms.   Next ICM clinic phone appointment due:  09/16/2023.     Next 91 day remote transmission due: 09/30/2023     EP/Cardiology Office Visits:  Recall 09/30/2023 with Jamie Barrack, NP.  Recall 12/07/2023 with Dr Jeffrie.   Copy of ICM check sent to Dr. Cindie.   3 month ICM trend: 08/13/2023.    12-14 Month ICM trend:     Jamie GORMAN Garner, RN 08/13/2023 12:11 PM

## 2023-08-13 NOTE — Telephone Encounter (Signed)
 Spoke with patient.  She is currently staying at inpatient rehab.  Advised monitor is showing disconnected.  She will have someone check the plug and send remote transmission today.

## 2023-08-30 ENCOUNTER — Encounter: Payer: Self-pay | Admitting: Obstetrics and Gynecology

## 2023-08-30 ENCOUNTER — Ambulatory Visit (INDEPENDENT_AMBULATORY_CARE_PROVIDER_SITE_OTHER): Admitting: Obstetrics and Gynecology

## 2023-08-30 VITALS — BP 126/72 | HR 65 | Temp 98.1°F | Wt 210.0 lb

## 2023-08-30 DIAGNOSIS — N9489 Other specified conditions associated with female genital organs and menstrual cycle: Secondary | ICD-10-CM | POA: Diagnosis not present

## 2023-08-30 DIAGNOSIS — R9389 Abnormal findings on diagnostic imaging of other specified body structures: Secondary | ICD-10-CM | POA: Diagnosis not present

## 2023-08-30 NOTE — Progress Notes (Signed)
 73 y.o. [id number] female with adnexal mass and thickened endometrium here for new patient consultation for surveillance. She has multiple comorbidities including type 2 diabetes, CAD with cardiac catheterization, hypertension, CHF, LBBB with AICD in place, history of bilateral DVT on Eliquis .  Legally Separated.  No LMP recorded. Patient is postmenopausal.   Right adnexal mass was originally noted on 01/12/19 CT a/p:  The uterus is unremarkable. A 4.9 cm x 3.8 cm cystic appearing areas seen within the right adnexa.  It was noted to be enlarging on 08/16/2021 CT A/P: Enlarging cystic right ovarian lesion, characterized by ultrasound in January as suspicious for cystic neoplasm and now 6.9 cm (5.2 cm last year). Recommend GYN Surgery consultation if not already done.   01/18/21 TVUS: IMPRESSION: 1. 5.6 cm cystic right ovarian mass containing thin internal septations raising concern for cystic neoplasm. Surgical consultation is recommended. 2. Endometrial thickness of 18.5 mm. Endometrial thickness is considered abnormal for an asymptomatic post-menopausal female. Endometrial sampling should be considered to exclude carcinoma. 3. Nonvisualized left ovary.  06/06/21 MRI pelvis: slightly increased size of the right ovarian mass measuring 6.7 cm with a nonenhancing solid component meeting criteria for O RADS MRI 3.   10/30/21 TVUS: IMPRESSION: 1. As seen on 01/18/2021, there is abnormal thickening of the endometrial complex for a patient of this age. Gyn consult and endometrial tissue sampling recommended unless already done. 2. Today's study does not detect the nearly 7 cm cystic lesion of the right ovary noted on the prior studies to have enlarged between the 2 exams. Given the limited quality of this study, I am not convinced that this has resolved. Unless a surgical intervention has been performed in the interval, CT or MRI is recommended.  08/31/22 CT A/p: Uterus and left adnexa unremarkable.  Right ovarian cyst measures 4.5 cm, stable.  12/26/22 pelvic US : Right ovary: Transabdominal and transvaginal imaging was performed. Limited assessment secondary to poor acoustic windows and bowel gas. There is an irregular right adnexal cystic lesion measuring 6.5 x 4.3 x 5.5 cm, overall similar to the prior. Limited Doppler assessment. Normal ovarian tissue is not well visualized.       Right ovary: 5.6 x 7.5 x 5.6 cm.   OTHER: No abnormal pelvic free fluid.   IMPRESSION:   Right adnexa cystic lesion, which is overall stable in size compared to priors with limited assessment, and was previously scored as O-RADS 3 on MRI dated 09/21/2021. Follow-up MRI could be considered to better assess due to limitations of the study.   06/06/21 EMB:  Scant benign endometrial and endocervical cells  She reports menopause at age 3. She is recently out of rehab over broken right foot. Missed imaging appt in June 2024. No pelvic pain or PMB No family history of breast or ovarian cancer.   Referred to [facility], Dr. Julia, who completed EMB and MRI and initial TM and referred to GYN ONC. 01/05/21  HE4 101 (elevated, upper limit normal 96) CA125 14 Ova 1 - post menopausal - low risk  Referred to GYN ONC in 2023 for increasing size. 2023 ECHO 30%. Planned for observation given chronic mass, overall stability and normal TM and comorbidities. Plan for surgery if ORADS 4 only noted at 06/27/22 GYN ONC appt.  06/06/21 HE4 117 CA 125 12 LDH 258 (elevated) AFP <2 Inhibin B <10 Inhibin A 2.0  Last appt 06/27/22: Surgical risk continue to outweight medical risk of this likely benign adenxal lesion. A cystic neoplasm such as  cystadenoma seems likely but is unlikely to impact her life expectancy. Will continue to monitor this q6 months which can be done locally and would readdress surgical resection if concerning solid components develop or significant changes.   OB History  Gravida Para Term Preterm AB Living   4 4 4   4   SAB IAB Ectopic Multiple Live Births      4    # Outcome Date GA Lbr Len/2nd Weight Sex Type Anes PTL Lv  4 Term      Vag-Spont   LIV  3 Term      Vag-Spont   LIV  2 Term      Vag-Spont   LIV  1 Term      Vag-Spont   LIV   Past Medical History:  Diagnosis Date   Abnormal LFTs    AICD (automatic cardioverter/defibrillator) present    Angina decubitus (HCC) 11/06/2012   Angina pectoris (HCC)    Anxiety    Arthritis    Back pain    Bundle branch block 05/2016   CAD (coronary artery disease) 06/18/2010   CHF (congestive heart failure) (HCC)    Chronic combined systolic and diastolic CHF, NYHA class 3 (HCC)    Chronic combined systolic and diastolic heart failure (HCC)    Chronic systolic dysfunction of left ventricle 09/24/2012   Coronary artery disease    s/p CABG 2007   Depression    Dermal mycosis    Diabetes mellitus    type II   DKA (diabetic ketoacidoses) 01/12/2019   DVT (deep venous thrombosis) (HCC) 09/2015   bilateral   Edema, lower extremity    Glaucoma    Hyperlipidemia    Hypertension    Hypoglycemia 09/09/2012   Insomnia    Insulin  dependent diabetes mellitus (HCC) 04/23/2012   Ischemic cardiomyopathy    EF 30-35%, s/p ICD 4/08 by Dr Malva   Joint pain    LBBB (left bundle branch block) 09/24/2012   Morbid obesity (HCC) 11/06/2012   Obesity    Oxygen dependent 2 L   Pain in left knee    Peripheral neuropathy    Peripheral vascular disease (HCC)    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   LIMA to D2, SVG to ramus intermediate, sequential SVG to OM1-OM2, SVG to RCA with EVH via both legs    Sleep apnea    uses O2 at night   Tinea    Type 2 diabetes mellitus with hyperglycemia, with long-term current use of insulin  (HCC) 04/20/2019   Uncontrolled diabetes mellitus 02/2019   Ventricular tachycardia (HCC) 01/16/2015   sustained VT terminated with ATP, CL 340 msec   Vitamin B 12 deficiency 11/05/2018   Vitamin D  deficiency 11/05/2018   Past Surgical  History:  Procedure Laterality Date   BI-VENTRICULAR IMPLANTABLE CARDIOVERTER DEFIBRILLATOR UPGRADE N/A 09/25/2012   Procedure: BI-VENTRICULAR IMPLANTABLE CARDIOVERTER DEFIBRILLATOR UPGRADE;  Surgeon: Lynwood JONETTA Rakers, MD;  Location[facility] CATH LAB;  Service: Cardiovascular;  Laterality: N/A;   BIV ICD GENERATOR CHANGEOUT N/A 12/11/2022   Procedure: BIV ICD GENERATOR CHANGEOUT;  Surgeon: Cindie Ole DASEN, MD;  Location: [facility] INVASIVE CV LAB;  Service: Cardiovascular;  Laterality: N/A;   BIV ICD GENERATOR CHANGEOUT N/A 12/28/2022   Procedure: BIV ICD GENERATOR CHANGEOUT;  Surgeon: Cindie Ole DASEN, MD;  Location: [facility] INVASIVE CV LAB;  Service: Cardiovascular;  Laterality: N/A;   BIV UPGRADE Left 12/28/2022   Procedure: BIV UPGRADE;  Surgeon: Cindie Ole DASEN, MD;  Location: [facility] INVASIVE CV  LAB;  Service: Cardiovascular;  Laterality: Left;   BREAST BIOPSY Right 04/05/2014   CARDIAC CATHETERIZATION     CARDIAC DEFIBRILLATOR PLACEMENT  4/08   by Dr Malva (MDT)   COLONOSCOPY WITH PROPOFOL  N/A 10/12/2016   Procedure: COLONOSCOPY WITH PROPOFOL ;  Surgeon: Legrand Victory LITTIE DOUGLAS, MD;  Location: [facility] ENDOSCOPY;  Service: Gastroenterology;  Laterality: N/A;   CORONARY ARTERY BYPASS GRAFT  10/05/2005   by Dr Lucas   IMPLANTABLE CARDIOVERTER DEFIBRILLATOR IMPLANT  09/25/12   attempt of upgrade to CRT-D unsuccessful due to CS anatomy, SJM Unify Asaura device placed with LV port capped by Dr Kelsie   IR GENERIC HISTORICAL  10/03/2015   IR US  GUIDE VASC ACCESS LEFT 10/03/2015 Wilkie Lent, MD [facility]   IR GENERIC HISTORICAL  10/03/2015   IR VENO/EXT/UNI LEFT 10/03/2015 Wilkie Lent, MD [facility]   IR GENERIC HISTORICAL  10/03/2015   IR VENOCAVAGRAM IVC 10/03/2015 Wilkie Lent, MD [facility]   IR GENERIC HISTORICAL  10/03/2015   IR INFUSION THROMBOL VENOUS INITIAL (MS) 10/03/2015 Wilkie Lent, MD [facility]   IR GENERIC HISTORICAL  10/03/2015   IR US  GUIDE VASC ACCESS LEFT 10/03/2015 Wilkie Lent, MD [facility]   IR GENERIC HISTORICAL  10/04/2015   IR THROMBECT VENO MECH MOD SED 10/04/2015 Norleen Roulette, MD [facility]   IR GENERIC HISTORICAL  10/04/2015   IR TRANSCATH PLC STENT 1ST ART NOT LE CV CAR VERT CAR 10/04/2015 Norleen Roulette, MD [facility]   IR GENERIC HISTORICAL  10/04/2015   IR THROMB F/U EVAL ART/VEN FINAL DAY (MS) 10/04/2015 Norleen Roulette, MD [facility]   IR GENERIC HISTORICAL  11/02/2015   IR RADIOLOGIST EVAL & MGMT 11/02/2015 Norleen Roulette, MD [facility] INTER[facility] RAD   IR RADIOLOGIST EVAL & MGMT  04/26/2016   LEFT HEART CATH AND CORS/GRAFTS ANGIOGRAPHY N/A 01/19/2019   Procedure: LEFT HEART CATH AND CORS/GRAFTS ANGIOGRAPHY;  Surgeon: Claudene Victory ORN, MD;  Location: [facility] INVASIVE CV LAB;  Service: Cardiovascular;  Laterality: N/A;   LEFT HEART CATH AND CORS/GRAFTS ANGIOGRAPHY N/A 01/15/2022   Procedure: LEFT HEART CATH AND CORS/GRAFTS ANGIOGRAPHY;  Surgeon: Court Dorn PARAS, MD;  Location: [facility] INVASIVE CV LAB;  Service: Cardiovascular;  Laterality: N/A;   ORIF ANKLE FRACTURE Right 04/17/2023   Procedure: OPEN REDUCTION INTERNAL FIXATION (ORIF) ANKLE FRACTURE;  Surgeon: Barton Drape, MD;  Location: [facility];  Service: Orthopedics;  Laterality: Right;   Current Outpatient Medications on File Prior to Visit  Medication Sig Dispense Refill   ACCU-CHEK GUIDE TEST test strip TEST BLOOD SUGAR THREE TIMES DAILY 300 strip 3   acetaminophen  (TYLENOL ) 500 MG tablet Take 500 mg by mouth 3 (three) times daily as needed for mild pain (pain score 1-3) or moderate pain (pain score 4-6).     amiodarone  (PACERONE ) 100 MG tablet Take 100 mg by mouth daily.     amiodarone  (PACERONE ) 200 MG tablet TAKE 1/2 TABLET EVERY DAY 45 tablet 3   apixaban  (ELIQUIS ) 5 MG TABS tablet TAKE 1 TABLET TWICE DAILY 180 tablet 3   calcitonin, salmon, (MIACALCIN /FORTICAL) 200 UNIT/ACT nasal spray Place 1 spray into alternate nostrils daily.     Continuous Glucose Sensor (DEXCOM G7 SENSOR) MISC 1 Device by Does not  apply route as directed. 9 each 3   cyanocobalamin (,VITAMIN B-12,) 1000 MCG/ML injection Inject 1,000 mcg into the muscle every 30 (thirty) days.     dextrose  (GLUTOSE) 40 % GEL Take 15 g by mouth as needed for low blood  sugar.     ENTRESTO  24-26 MG TAKE 1 TABLET TWICE DAILY 180 tablet 3   estradiol (ESTRACE) 0.1 MG/GM vaginal cream Place vaginally.     FARXIGA  10 MG TABS tablet TAKE 1 TABLET EVERY DAY 90 tablet 3   ferrous sulfate  325 (65 FE) MG EC tablet Take 325 mg by mouth daily with breakfast.     gabapentin  (NEURONTIN ) 300 MG capsule Take 300 mg by mouth 3 (three) times daily.     glucose blood (ACCU-CHEK GUIDE) test strip Check 3 times daily 100 each 12   guaiFENesin  (MUCINEX ) 600 MG 12 hr tablet Take 600 mg by mouth 2 (two) times daily as needed for cough.     Insulin  Pen Needle 31G X 5 MM MISC 1 Device by Does not apply route in the morning, at noon, in the evening, and at bedtime. 400 each 3   ketoconazole (NIZORAL) 2 % cream Apply 1 Application topically daily as needed for irritation.     latanoprost  (XALATAN ) 0.005 % ophthalmic solution Place 1 drop into both eyes at bedtime.     nitroGLYCERIN  (NITROSTAT ) 0.4 MG SL tablet PLACE 1 TABLET (0.4 MG TOTAL) UNDER THE TONGUE EVERY 5 (FIVE) MINUTES AS NEEDED FOR CHEST PAIN. 75 tablet 3   NOVOLOG  FLEXPEN 100 UNIT/ML FlexPen INJECT PER SLIDING SCALE AS DIRECTED , MAX 30 UNITS PER DAY. DISCARD PEN 28 DAYS AFTER OPENING 30 mL 3   OXYGEN Inhale 2 L into the lungs continuous.     rosuvastatin  (CRESTOR ) 20 MG tablet Take 1 tablet (20 mg total) by mouth daily at 6 PM. (Patient taking differently: Take 20 mg by mouth every evening.) 30 tablet 2   TRESIBA  FLEXTOUCH 100 UNIT/ML FlexTouch Pen INJECT 30 UNITS INTO THE SKIN DAILY. (Patient taking differently: Inject 40 Units into the skin daily.) 30 mL 3   TRUE METRIX BLOOD GLUCOSE TEST test strip TEST THREE TIMES DAILY 300 strip 3   Vitamin D , Ergocalciferol , (DRISDOL ) 1.25 MG (50000 UT) CAPS capsule  Take 1 capsule (50,000 Units total) by mouth every 7 (seven) days. (Patient taking differently: Take 50,000 Units by mouth every 7 (seven) days. Thursdays) 4 capsule 0   bisacodyl (DULCOLAX) 10 MG suppository Place 10 mg rectally daily as needed for moderate constipation or mild constipation. (Patient not taking: Reported on 08/30/2023)     ciprofloxacin (CIPRO) 250 MG tablet Take 250 mg by mouth 2 (two) times daily. (Patient not taking: Reported on 08/30/2023)     docusate sodium  (COLACE) 100 MG capsule Take 100 mg by mouth 2 (two) times daily. (Patient not taking: Reported on 08/30/2023)     ezetimibe  (ZETIA ) 10 MG tablet Take 1 tablet (10 mg total) by mouth daily. (Patient not taking: Reported on 08/30/2023) 90 tablet 3   HUMALOG 100 UNIT/ML injection Inject into the skin. (Patient not taking: Reported on 08/30/2023)     ondansetron  (ZOFRAN -ODT) 4 MG disintegrating tablet Take 4 mg by mouth every 6 (six) hours as needed for nausea or vomiting. (Patient not taking: Reported on 08/30/2023)     oxyCODONE  (OXY IR/ROXICODONE ) 5 MG immediate release tablet Take 1-2 tablets (5-10 mg total) by mouth every 4 (four) hours as needed for severe pain (pain score 7-10). (Patient not taking: Reported on 08/30/2023) 10 tablet 0   TRADJENTA 5 MG TABS tablet Take 5 mg by mouth daily. (Patient not taking: Reported on 08/30/2023)     No current facility-administered medications on file prior to visit.   Allergies  Allergen  Reactions   Metformin And Related Itching, Swelling and Other (See Comments)    Leg pain & swelling in legs   Ozempic  (0.25 Or 0.5 Mg-Dose) [Semaglutide (0.25 Or 0.5mg -Dos)] Other (See Comments)    Pt with hx of pancreatitis    Atorvastatin  Calcium  Itching and Rash   Levofloxacin  Itching      PE Today's Vitals   08/30/23 0825  BP: 126/72  Pulse: 65  Temp: 98.1 F (36.7 C)  TempSrc: Oral  SpO2: 98%  Weight: 210 lb (95.3 kg)   Body mass index is 37.2 kg/m.  Physical Exam Vitals reviewed.  Exam conducted with a chaperone present.  Constitutional:      General: She is not in acute distress.    Appearance: Normal appearance.     Comments: In wheelchair with RLE boot  HENT:     Head: Normocephalic and atraumatic.     Nose: Nose normal.  Eyes:     Extraocular Movements: Extraocular movements intact.     Conjunctiva/sclera: Conjunctivae normal.  Pulmonary:     Effort: Pulmonary effort is normal.  Genitourinary:    General: Normal vulva.     Exam position: Lithotomy position.     Vagina: Normal. No vaginal discharge.     Cervix: Normal. No cervical motion tenderness, discharge or lesion.     Uterus: Normal. Not enlarged and not tender.      Adnexa: Right adnexa normal and left adnexa normal.  Musculoskeletal:        General: Normal range of motion.     Cervical back: Normal range of motion.  Neurological:     General: No focal deficit present.     Mental Status: She is alert.  Psychiatric:        Mood and Affect: Mood normal.        Behavior: Behavior normal.       Assessment and Plan:        Adnexal mass -     Ambulatory referral to Gynecologic Oncology  Thickened endometrium -     US  PELVIS TRANSVAGINAL NON-OB (TV ONLY); Future -     Ambulatory referral to Gynecologic Oncology    Patient with known right adnexal lesion, possible cystic neoplasm such as cystadenoma. Followed by [facility] however lost to follow-up due to distance. Last seen in 2024. Imaging and labs reviewed as noted Will order TVUS to continue surveillance Recommend GYN ONC referral for ongoing surveillance and management  Vera LULLA Pa, MD

## 2023-09-06 ENCOUNTER — Telehealth: Payer: Self-pay | Admitting: *Deleted

## 2023-09-06 NOTE — Telephone Encounter (Signed)
 LMOM for the patient to call the office back. Patient needs to be scheduled for a new patient appt the week of 9/22. Need to ask the patient what type of lift she uses for transfers.

## 2023-09-10 ENCOUNTER — Emergency Department (HOSPITAL_COMMUNITY)

## 2023-09-10 ENCOUNTER — Other Ambulatory Visit: Payer: Self-pay

## 2023-09-10 ENCOUNTER — Emergency Department (HOSPITAL_COMMUNITY)
Admission: EM | Admit: 2023-09-10 | Discharge: 2023-09-10 | Disposition: A | Discharge status: Home / Self Care | Attending: Emergency Medicine | Admitting: Emergency Medicine

## 2023-09-10 ENCOUNTER — Encounter (HOSPITAL_COMMUNITY): Payer: Self-pay

## 2023-09-10 DIAGNOSIS — M545 Low back pain, unspecified: Secondary | ICD-10-CM | POA: Diagnosis present

## 2023-09-10 DIAGNOSIS — Z7901 Long term (current) use of anticoagulants: Secondary | ICD-10-CM | POA: Insufficient documentation

## 2023-09-10 DIAGNOSIS — E119 Type 2 diabetes mellitus without complications: Secondary | ICD-10-CM | POA: Diagnosis not present

## 2023-09-10 DIAGNOSIS — Z794 Long term (current) use of insulin: Secondary | ICD-10-CM | POA: Insufficient documentation

## 2023-09-10 DIAGNOSIS — Z79899 Other long term (current) drug therapy: Secondary | ICD-10-CM | POA: Insufficient documentation

## 2023-09-10 MED ORDER — LIDOCAINE 5 % EX PTCH: 1.0000 | MEDICATED_PATCH | CUTANEOUS | 0 refills | Status: AC

## 2023-09-10 MED ORDER — LIDOCAINE 5 % EX PTCH
1.0000 | MEDICATED_PATCH | CUTANEOUS | Status: DC
  Administered 2023-09-10: 1 via TRANSDERMAL
  Filled 2023-09-10: qty 1

## 2023-09-10 NOTE — ED Triage Notes (Signed)
 Pt BIB [facility] from home C/O lower back pain X 2 days. Hx lumbar spine injury.   2L [place] at baseline.

## 2023-09-10 NOTE — ED Notes (Signed)
 Called [facility] to Transport patient home

## 2023-09-10 NOTE — ED Provider Notes (Signed)
 [place] [facility]MENT AT [facility] Provider Note   CSN: [id number] Arrival date & time: 09/10/23  1146     Patient presents with: Back Pain   [name] is a 73 y.o. female.   73 year old female presenting with back pain.  Patient states that has been ongoing for several days, denies fall/trigger/inciting event.  Describes lower back pain with radiation to her right flank.  No numbness/tingling of lower extremities, no fever, no dysuria, no bowel/bladder incontinence.  Has been using Tylenol  with minimal relief of her symptoms. On 2L [place] at baseline.   Back Pain      Prior to Admission medications   Medication Sig Start Date End Date Taking? Authorizing Provider  ACCU-CHEK GUIDE TEST test strip TEST BLOOD SUGAR THREE TIMES DAILY 04/25/23   Shamleffer, Ibtehal Jaralla, MD  acetaminophen  (TYLENOL ) 500 MG tablet Take 500 mg by mouth 3 (three) times daily as needed for mild pain (pain score 1-3) or moderate pain (pain score 4-6).    [provider]  amiodarone  (PACERONE ) 100 MG tablet Take 100 mg by mouth daily. 04/23/23   [provider]  amiodarone  (PACERONE ) 200 MG tablet TAKE 1/2 TABLET EVERY DAY 06/10/23   Jeffrie Oneil BROCKS, MD  apixaban  (ELIQUIS ) 5 MG TABS tablet TAKE 1 TABLET TWICE DAILY 11/14/22   Sheree Penne Bruckner, MD  bisacodyl (DULCOLAX) 10 MG suppository Place 10 mg rectally daily as needed for moderate constipation or mild constipation. Patient not taking: Reported on 08/30/2023    [provider]  calcitonin, salmon, (MIACALCIN MARCIANA) 200 UNIT/ACT nasal spray Place 1 spray into alternate nostrils daily. 10/12/22   [provider]  ciprofloxacin (CIPRO) 250 MG tablet Take 250 mg by mouth 2 (two) times daily. Patient not taking: Reported on 08/30/2023 07/30/23   [provider]  Continuous Glucose Sensor (DEXCOM G7 SENSOR) MISC 1 Device by Does not apply route as directed. 01/31/23   Shamleffer, Ibtehal  Jaralla, MD  cyanocobalamin (,VITAMIN B-12,) 1000 MCG/ML injection Inject 1,000 mcg into the muscle every 30 (thirty) days.    [provider]  dextrose  (GLUTOSE) 40 % GEL Take 15 g by mouth as needed for low blood sugar.    [provider]  docusate sodium  (COLACE) 100 MG capsule Take 100 mg by mouth 2 (two) times daily. Patient not taking: Reported on 08/30/2023    [provider]  ENTRESTO  24-26 MG TAKE 1 TABLET TWICE DAILY 11/26/22   Jeffrie Oneil BROCKS, MD  estradiol (ESTRACE) 0.1 MG/GM vaginal cream Place vaginally. 08/14/23   [provider]  ezetimibe  (ZETIA ) 10 MG tablet Take 1 tablet (10 mg total) by mouth daily. Patient not taking: Reported on 08/30/2023 01/15/23   Jeffrie Oneil BROCKS, MD  FARXIGA  10 MG TABS tablet TAKE 1 TABLET EVERY DAY 07/15/23   Shamleffer, Ibtehal Jaralla, MD  ferrous sulfate  325 (65 FE) MG EC tablet Take 325 mg by mouth daily with breakfast.    [provider]  gabapentin  (NEURONTIN ) 300 MG capsule Take 300 mg by mouth 3 (three) times daily. 01/15/23   [provider]  glucose blood (ACCU-CHEK GUIDE) test strip Check 3 times daily 02/22/22   Shamleffer, Ibtehal Jaralla, MD  guaiFENesin  (MUCINEX ) 600 MG 12 hr tablet Take 600 mg by mouth 2 (two) times daily as needed for cough.    [provider]  HUMALOG 100 UNIT/ML injection Inject into the skin. Patient not taking: Reported on 08/30/2023 07/06/23   [provider]  Insulin  Pen Needle 31G X 5 MM MISC 1 Device by Does not apply route in the morning, at noon, in the evening, and at bedtime. 11/03/21   Shamleffer, Ibtehal Jaralla, MD  ketoconazole (NIZORAL) 2 % cream Apply 1 Application topically daily as needed for irritation. 03/25/23   [provider]  latanoprost  (XALATAN ) 0.005 % ophthalmic solution Place 1 drop into both eyes at bedtime.    [provider]  nitroGLYCERIN  (NITROSTAT ) 0.4 MG SL tablet PLACE 1 TABLET (0.4 MG TOTAL) UNDER THE TONGUE  EVERY 5 (FIVE) MINUTES AS NEEDED FOR CHEST PAIN. 11/26/22   Jeffrie Oneil BROCKS, MD  NOVOLOG  FLEXPEN 100 UNIT/ML FlexPen INJECT PER SLIDING SCALE AS DIRECTED , MAX 30 UNITS PER DAY. DISCARD PEN 28 DAYS AFTER OPENING 06/19/23   Shamleffer, Donell Cardinal, MD  ondansetron  (ZOFRAN -ODT) 4 MG disintegrating tablet Take 4 mg by mouth every 6 (six) hours as needed for nausea or vomiting. Patient not taking: Reported on 08/30/2023    [provider]  oxyCODONE  (OXY IR/ROXICODONE ) 5 MG immediate release tablet Take 1-2 tablets (5-10 mg total) by mouth every 4 (four) hours as needed for severe pain (pain score 7-10). Patient not taking: Reported on 08/30/2023 04/23/23   Gherghe, Costin M, MD  OXYGEN Inhale 2 L into the lungs continuous.    [provider]  rosuvastatin  (CRESTOR ) 20 MG tablet Take 1 tablet (20 mg total) by mouth daily at 6 PM. Patient taking differently: Take 20 mg by mouth every evening. 10/07/15   Daryl Cough, MD  TRADJENTA 5 MG TABS tablet Take 5 mg by mouth daily. Patient not taking: Reported on 08/30/2023 08/05/23   [provider]  TRESIBA  FLEXTOUCH 100 UNIT/ML FlexTouch Pen INJECT 30 UNITS INTO THE SKIN DAILY. Patient taking differently: Inject 40 Units into the skin daily. 11/29/22   Shamleffer, Ibtehal Jaralla, MD  TRUE METRIX BLOOD GLUCOSE TEST test strip TEST THREE TIMES DAILY 12/11/21   Shamleffer, Ibtehal Jaralla, MD  Vitamin D , Ergocalciferol , (DRISDOL ) 1.25 MG (50000 UT) CAPS capsule Take 1 capsule (50,000 Units total) by mouth every 7 (seven) days. Patient taking differently: Take 50,000 Units by mouth every 7 (seven) days. Thursdays 12/30/18   Delores Shields A, DO    Allergies: Metformin and related, Ozempic  (0.25 or 0.5 mg-dose) [semaglutide (0.25 or 0.5mg -dos)], Atorvastatin  calcium , and Levofloxacin     Review of Systems  Musculoskeletal:  Positive for back pain.    Updated Vital Signs  Vitals:   09/10/23 1156 09/10/23 1157  BP: (!) 195/95    Pulse: 79   Resp: 20   Temp: 97.9 F (36.6 C)   TempSrc: Oral   SpO2: 99% 100%     Physical Exam Vitals and nursing note reviewed.  HENT:     Head: Normocephalic.  Eyes:     Extraocular Movements: Extraocular movements intact.     Pupils: Pupils are equal, round, and reactive to light.  Cardiovascular:     Rate and Rhythm: Normal rate.  Pulmonary:     Effort: Pulmonary effort is normal.     Comments: On 2L [place] Abdominal:     Tenderness: There is no right CVA tenderness or left CVA tenderness.  Musculoskeletal:     Cervical back: Normal range of motion. No rigidity or tenderness.     Comments: 5/5 strength against resistance of bilateral lower extremities Boot on RLE Back: No TTP of C-spine/T-spine, mild TTP of L-spine with associated R paralumbar muscle TTP  Skin:    General: Skin is  warm and dry.  Neurological:     Mental Status: She is alert and oriented to person, place, and time.     (all labs ordered are listed, but only abnormal results are displayed) Labs Reviewed - No data to display  EKG: None  Radiology: DG Lumbar Spine Complete Result Date: 09/10/2023 CLINICAL DATA:  Back pain.  History of compression fractures. EXAM: LUMBAR SPINE - COMPLETE 4+ VIEW COMPARISON:  CT of the lumbar spine dated 11/02/2022. FINDINGS: 5 nonrib bearing lumbar-type vertebral bodies. Diffuse osseous demineralization. Redemonstrated moderate compression deformities of L2, L3, and L4, not significantly changed compared to the prior exam. Suspected progressive mild superior endplate compression deformity at L5. Minimal grade 1 anterolisthesis of L5 on S1. Moderate multilevel degenerative disc changes. SI joints and pubic symphysis are anatomically aligned. Aortoiliac atherosclerotic calcification. Left common iliac venous stent. IMPRESSION: 1. Suspected progressive mild superior endplate compression deformity of L5. 2. Redemonstrated moderate compression deformities of L2, L3, and L4, not  significantly changed compared to the prior exam. 3. Moderate multilevel degenerative disc changes with minimal grade 1 anterolisthesis of L5 on S1. Electronically Signed   By: Harrietta Sherry M.D.   On: 09/10/2023 15:17     Procedures   Medications Ordered in the ED  lidocaine  (LIDODERM ) 5 % 1 patch (1 patch Transdermal Patch Applied 09/10/23 1343)                                    Medical Decision Making This patient presents to the ED for concern of back pain, this involves an extensive number of treatment options, and is a complaint that carries with it a high risk of complications and morbidity.  The differential diagnosis includes fracture, musculoskeletal pain/strain/sprain, cauda equina   Co morbidities that complicate the patient evaluation  Obesity, diabetes, recent R ankle fracture   Additional history obtained:  Additional history obtained from record review External records from outside source obtained and reviewed including prior ED notes   Imaging Studies ordered:  I ordered imaging studies including XR L-spine  I independently visualized and interpreted imaging which showed 1. Suspected progressive mild superior endplate compression deformity of L5. 2. Redemonstrated moderate compression deformities of L2, L3, and L4, not significantly changed compared to the prior exam. 3. Moderate multilevel degenerative disc changes with minimal grade 1 anterolisthesis of L5 on S1.    I agree with the radiologist interpretation   Cardiac Monitoring: / EKG:  The patient was maintained on a cardiac monitor.  I personally viewed and interpreted the cardiac monitored which showed an underlying rhythm of: NSR   Problem List / ED Course / Critical interventions / Medication management   I ordered medication including lidocaine  patch  for back pain  I have reviewed the patients home medicines and have made adjustments as needed   Test / Admission - Considered:  Physical  exam notable for mild L-spine TTP with right-sided paralumbar muscle TTP, will proceed with x-ray imaging given history of chronic back pain with prior compression fracture according to the patient.  No back pain red flags. X-ray results notable for progressively worsening compression deformity of L5, otherwise largely stable from previous.  Encouraged patient to continue Tylenol , upon record review she is able to use 300 mg gabapentin  up to 3 times daily, currently she is only using this once daily, I encouraged her that if her pain persist she may use this  up to 3 times daily, she voiced understanding.  Lidocaine  patch given, will send more to pharmacy.  Patient established with Dr. Burnetta, I encouraged her to follow-up if her back pain persist.  Return precautions discussed.  She is appropriate for this time.  Staffed with Dr. Garrick    Amount and/or Complexity of Data Reviewed Radiology: ordered.  Risk Prescription drug management.        Final diagnoses:  Low back pain without sciatica, unspecified back pain laterality, unspecified chronicity    ED Discharge Orders          Ordered    lidocaine  (LIDODERM ) 5 %  Every 24 hours        09/10/23 1617               Glendia Rocky SAILOR, [place] 09/10/23 1647    Garrick Charleston, MD 09/11/23 [phone]

## 2023-09-10 NOTE — Discharge Instructions (Addendum)
 Follow up with your orthopedic specialist, Dr. Burnetta, in regard to your back pain. Continue Tylenol  and lidocaine  patches for pain, you are also prescribed 300mg  gabapentin  to be used three times daily for pain. Return to the emergency department if your symptoms worsen.

## 2023-09-10 NOTE — Telephone Encounter (Signed)
 Spoke with [name] regarding her referral to GYN oncology. She has an appointment scheduled with Dr. Eldonna on 09/30/23 at 11:15. Patient agrees to date and time. She has been provided with office address and location. She is also aware of our mask and visitor policy. Patient verbalized understanding and will call with any questions.

## 2023-09-12 ENCOUNTER — Encounter: Payer: Self-pay | Admitting: Pulmonary Disease

## 2023-09-16 ENCOUNTER — Other Ambulatory Visit: Payer: Self-pay

## 2023-09-16 ENCOUNTER — Ambulatory Visit: Attending: Cardiology

## 2023-09-16 DIAGNOSIS — Z95810 Presence of automatic (implantable) cardiac defibrillator: Secondary | ICD-10-CM | POA: Diagnosis not present

## 2023-09-16 DIAGNOSIS — I5042 Chronic combined systolic (congestive) and diastolic (congestive) heart failure: Secondary | ICD-10-CM | POA: Diagnosis not present

## 2023-09-16 MED ORDER — ACCU-CHEK GUIDE ME W/DEVICE KIT: PACK | 0 refills | Status: AC

## 2023-09-16 NOTE — Telephone Encounter (Signed)
 Prescription requested from [facility] through fax.

## 2023-09-18 ENCOUNTER — Other Ambulatory Visit: Payer: Self-pay | Admitting: Cardiology

## 2023-09-18 NOTE — Progress Notes (Signed)
 EPIC Encounter for ICM Monitoring  Patient Name: [name] is a 73 y.o. female Date: 09/18/2023 Primary Care Physican: Cristopher Suzen HERO, NP Primary Cardiologist: Skains/Weaver PA  Electrophysiologist: Cindie BiV Pacing: 95% (08/12/2023 report) 04/16/2022 Weight: 230 lbs 05/15/2022 Weight:  230 lbs 08/29/2022 Weight: 230 lbs 11/21/2022 Office Weight: 237 lbs 02/18/2023 Weight: 230 lbs  04/03/2023 Office Weight: 230 lbs 06/25/2023 Weight: 234 lbs lbs  09/18/2023 Weight: 230 lbs   AT/AF Burden: 0% (taking Eliquis ) (08/12/2023 report)                                                           Spoke with patient and heart failure questions reviewed.  Transmission results reviewed.  Pt asymptomatic for fluid accumulation.  Reports feeling well at this time and voices no complaints.   9/2 ED visit for back pain.   CorVue thoracic impedance suggesting possible dryness from 8/19-9/3 and changing to possible fluid accumulation starting 9/4 and returning to baseline 9/7.      Prescribed: No diuretic   Labs: 04/20/2023 Creatinine 1.02, BUN 25, Potassium 4.6, Sodium 137, GFR 58  04/19/2023 Creatinine 1.01, BUN 21, Potassium 4.4, Sodium 135, GFR 59  04/18/2023 Creatinine 1.12, BUN 20, Potassium 4.3, Sodium 137, GFR 52  04/17/2023 Creatinine 1.14, BUN 18, Potassium 4.7, Sodium 134, GFR 51 04/16/2023 Creatinine 0.82, BUN 21, Potassium 4.0, Sodium 140, GFR >60  04/15/2023 Creatinine 0.79, BUN 19, Potassium 3.1, Sodium 144, GFR >60  A complete set of results can be found in Results Review.   Recommendations:  No changes and encouraged to call if experiencing any fluid symptoms.   Next ICM clinic phone appointment due:  10/28/2023.     Next 91 day remote transmission due: 09/30/2023     EP/Cardiology Office Visits:  Recall 09/30/2023 with Daphne Barrack, NP.  Recall 12/07/2023 with Dr Jeffrie.   Copy of ICM check sent to Dr. Cindie.   3 month ICM trend: 09/15/2023.    Mitzie GORMAN Garner,  RN 09/18/2023 8:04 AM

## 2023-09-20 ENCOUNTER — Other Ambulatory Visit: Payer: Self-pay | Admitting: Vascular Surgery

## 2023-09-20 DIAGNOSIS — I739 Peripheral vascular disease, unspecified: Secondary | ICD-10-CM

## 2023-09-26 ENCOUNTER — Other Ambulatory Visit: Admitting: Obstetrics and Gynecology

## 2023-09-26 ENCOUNTER — Encounter: Payer: Self-pay | Admitting: Psychiatry

## 2023-09-26 ENCOUNTER — Ambulatory Visit

## 2023-09-27 ENCOUNTER — Telehealth: Payer: Self-pay

## 2023-09-27 NOTE — Telephone Encounter (Signed)
 I spoke to [name], she states she will call Dr.Hines office to reschedule the ultrasound.   Appointment with Dr.Newton has been rescheduled to 10/20 @ 11:15 (date requested by patient)

## 2023-09-27 NOTE — Telephone Encounter (Signed)
 Per Provider, 9/18 ultrasound was cancelled,  new patient appointment reschedule from 9/22 to date in October with Dr.Newton. Appointment needs to be scheduled after ultrasound.   LVM for [name] to call office.

## 2023-09-30 ENCOUNTER — Inpatient Hospital Stay: Admitting: Psychiatry

## 2023-09-30 ENCOUNTER — Telehealth: Payer: Self-pay | Admitting: *Deleted

## 2023-09-30 ENCOUNTER — Ambulatory Visit (INDEPENDENT_AMBULATORY_CARE_PROVIDER_SITE_OTHER): Payer: Medicare HMO

## 2023-09-30 DIAGNOSIS — I5042 Chronic combined systolic (congestive) and diastolic (congestive) heart failure: Secondary | ICD-10-CM | POA: Diagnosis not present

## 2023-09-30 DIAGNOSIS — N9489 Other specified conditions associated with female genital organs and menstrual cycle: Secondary | ICD-10-CM

## 2023-09-30 NOTE — Telephone Encounter (Signed)
 Spoke with [name] who called the office stating she has an appointment for her transvaginal ultrasound with [facility] on 9/30 at 0930. The office thanked the patient for calling and advised her message would be relayed to providers.

## 2023-10-01 NOTE — Progress Notes (Signed)
 This encounter was created in error - please disregard.

## 2023-10-02 ENCOUNTER — Ambulatory Visit: Payer: Self-pay | Admitting: Cardiology

## 2023-10-02 LAB — CUP PACEART REMOTE DEVICE CHECK
Battery Remaining Longevity: 76 mo
Battery Remaining Percentage: 83 %
Battery Voltage: 3.02 V
Brady Statistic AP VP Percent: 85 %
Brady Statistic AP VS Percent: 2.6 %
Brady Statistic AS VP Percent: 8.3 %
Brady Statistic AS VS Percent: 1 %
Brady Statistic RA Percent Paced: 84 %
Date Time Interrogation Session: 20250923111939
HighPow Impedance: 43 Ohm
HighPow Impedance: 43 Ohm
Implantable Lead Connection Status: 753985
Implantable Lead Connection Status: 753985
Implantable Lead Connection Status: 753985
Implantable Lead Implant Date: 20080423
Implantable Lead Implant Date: 20140918
Implantable Lead Implant Date: 20241220
Implantable Lead Location: 753858
Implantable Lead Location: 753859
Implantable Lead Location: 753860
Implantable Lead Model: 5076
Implantable Pulse Generator Implant Date: 20241220
Lead Channel Impedance Value: 430 Ohm
Lead Channel Impedance Value: 440 Ohm
Lead Channel Impedance Value: 510 Ohm
Lead Channel Pacing Threshold Amplitude: 0.5 V
Lead Channel Pacing Threshold Amplitude: 0.75 V
Lead Channel Pacing Threshold Amplitude: 0.875 V
Lead Channel Pacing Threshold Pulse Width: 0.4 ms
Lead Channel Pacing Threshold Pulse Width: 0.4 ms
Lead Channel Pacing Threshold Pulse Width: 0.4 ms
Lead Channel Sensing Intrinsic Amplitude: 1.2 mV
Lead Channel Sensing Intrinsic Amplitude: 7 mV
Lead Channel Setting Pacing Amplitude: 1.5 V
Lead Channel Setting Pacing Amplitude: 2 V
Lead Channel Setting Pacing Amplitude: 2 V
Lead Channel Setting Pacing Pulse Width: 0.4 ms
Lead Channel Setting Pacing Pulse Width: 0.4 ms
Lead Channel Setting Sensing Sensitivity: 0.5 mV
Pulse Gen Serial Number: 5567048

## 2023-10-02 NOTE — Progress Notes (Signed)
Remote ICD Transmission.

## 2023-10-22 ENCOUNTER — Ambulatory Visit (INDEPENDENT_AMBULATORY_CARE_PROVIDER_SITE_OTHER)

## 2023-10-22 ENCOUNTER — Encounter: Payer: Self-pay | Admitting: Obstetrics and Gynecology

## 2023-10-22 ENCOUNTER — Other Ambulatory Visit (HOSPITAL_COMMUNITY)
Admission: RE | Admit: 2023-10-22 | Discharge: 2023-10-22 | Disposition: A | Discharge status: Home / Self Care | Source: Ambulatory Visit | Attending: Obstetrics and Gynecology | Admitting: Obstetrics and Gynecology

## 2023-10-22 ENCOUNTER — Ambulatory Visit (INDEPENDENT_AMBULATORY_CARE_PROVIDER_SITE_OTHER): Admitting: Obstetrics and Gynecology

## 2023-10-22 VITALS — BP 122/76 | HR 66 | Temp 98.2°F | Wt 210.0 lb

## 2023-10-22 DIAGNOSIS — R9389 Abnormal findings on diagnostic imaging of other specified body structures: Secondary | ICD-10-CM | POA: Insufficient documentation

## 2023-10-22 DIAGNOSIS — N9489 Other specified conditions associated with female genital organs and menstrual cycle: Secondary | ICD-10-CM

## 2023-10-22 MED ORDER — LIDOCAINE HCL (PF) 1 % IJ SOLN: 10.0000 mL | Freq: Once | INTRAMUSCULAR | Status: AC

## 2023-10-22 NOTE — Progress Notes (Unsigned)
 73 y.o. [id number] female with right adnexal mass and thickened endometrium here for  TVUS. She has multiple comorbidities including type 2 diabetes, CAD with cardiac catheterization, hypertension, CHF, LBBB with AICD in place, history of bilateral DVT on Eliquis .  Legally Separated.  No LMP recorded. Patient is postmenopausal.   She is well without complaints.  She denies postmenopausal bleeding or pelvic pain. She reports menopause at age 78. She is recently out of rehab over broken right foot. Missed imaging appt in June 2024. No pelvic pain or PMB No family history of breast or ovarian cancer.  Right adnexal mass was originally noted on 01/12/19 CT a/p:  The uterus is unremarkable. A 4.9 cm x 3.8 cm cystic appearing areas seen within the right adnexa.  It was noted to be enlarging on 08/16/2021 CT A/P: Enlarging cystic right ovarian lesion, characterized by ultrasound in January as suspicious for cystic neoplasm and now 6.9 cm (5.2 cm last year). Recommend GYN Surgery consultation if not already done.  01/18/21 TVUS: IMPRESSION: 1. 5.6 cm cystic right ovarian mass containing thin internal septations raising concern for cystic neoplasm. Surgical consultation is recommended. 2. Endometrial thickness of 18.5 mm. Endometrial thickness is considered abnormal for an asymptomatic post-menopausal female. Endometrial sampling should be considered to exclude carcinoma. 3. Nonvisualized left ovary.  06/06/21 MRI pelvis: slightly increased size of the right ovarian mass measuring 6.7 cm with a nonenhancing solid component meeting criteria for O RADS MRI 3.   10/30/21 TVUS: IMPRESSION: 1. As seen on 01/18/2021, there is abnormal thickening of the endometrial complex for a patient of this age. Gyn consult and endometrial tissue sampling recommended unless already done. 2. Today's study does not detect the nearly 7 cm cystic lesion of the right ovary noted on the prior studies to have enlarged between  the 2 exams. Given the limited quality of this study, I am not convinced that this has resolved. Unless a surgical intervention has been performed in the interval, CT or MRI is recommended.  08/31/22 CT A/p: Uterus and left adnexa unremarkable. Right ovarian cyst measures 4.5 cm, stable.  12/26/22 pelvic US : Right ovary: Transabdominal and transvaginal imaging was performed. Limited assessment secondary to poor acoustic windows and bowel gas. There is an irregular right adnexal cystic lesion measuring 6.5 x 4.3 x 5.5 cm, overall similar to the prior. Limited Doppler assessment. Normal ovarian tissue is not well visualized.       Right ovary: 5.6 x 7.5 x 5.6 cm.   OTHER: No abnormal pelvic free fluid.   IMPRESSION:   Right adnexa cystic lesion, which is overall stable in size compared to priors with limited assessment, and was previously scored as O-RADS 3 on MRI dated 09/21/2021. Follow-up MRI could be considered to better assess due to limitations of the study.   06/06/21 EMB:  Scant benign endometrial and endocervical cells  Referred to [facility], Dr. Julia, who completed EMB and MRI and initial TM and referred to GYN ONC. 01/05/21  HE4 101 (elevated, upper limit normal 96) CA125 14 Ova 1 - post menopausal - low risk  Referred to GYN ONC in 2023 for increasing size. 2023 ECHO 30%. Planned for observation given chronic mass, overall stability and normal TM and comorbidities. Plan for surgery if ORADS 4 only noted at 06/27/22 GYN ONC appt.  06/06/21 HE4 117 CA 125 12 LDH 258 (elevated) AFP <2 Inhibin B <10 Inhibin A 2.0  Last appt 06/27/22: Surgical risk continue to outweight medical risk of this  likely benign adenxal lesion. A cystic neoplasm such as cystadenoma seems likely but is unlikely to impact her life expectancy. Will continue to monitor this q6 months which can be done locally and would readdress surgical resection if concerning solid components develop or significant changes.    OB History  Gravida Para Term Preterm AB Living  4 4 4   4   SAB IAB Ectopic Multiple Live Births      4    # Outcome Date GA Lbr Len/2nd Weight Sex Type Anes PTL Lv  4 Term      Vag-Spont   LIV  3 Term      Vag-Spont   LIV  2 Term      Vag-Spont   LIV  1 Term      Vag-Spont   LIV   Past Medical History:  Diagnosis Date   Abnormal LFTs    AICD (automatic cardioverter/defibrillator) present    Angina decubitus 11/06/2012   Angina pectoris    Anxiety    Arthritis    Back pain    Bundle branch block 05/2016   CAD (coronary artery disease) 06/18/2010   CHF (congestive heart failure) (HCC)    Chronic combined systolic and diastolic CHF, NYHA class 3 (HCC)    Chronic combined systolic and diastolic heart failure (HCC)    Chronic systolic dysfunction of left ventricle 09/24/2012   Coronary artery disease    s/p CABG 2007   Depression    Dermal mycosis    Diabetes mellitus    type II   DKA (diabetic ketoacidoses) 01/12/2019   DVT (deep venous thrombosis) (HCC) 09/2015   bilateral   Edema, lower extremity    Glaucoma    Hyperlipidemia    Hypertension    Hypoglycemia 09/09/2012   Insomnia    Insulin  dependent diabetes mellitus (HCC) 04/23/2012   Ischemic cardiomyopathy    EF 30-35%, s/p ICD 4/08 by Dr Malva   Joint pain    LBBB (left bundle branch block) 09/24/2012   Morbid obesity (HCC) 11/06/2012   Obesity    Oxygen dependent 2 L   Pain in left knee    Peripheral neuropathy    Peripheral vascular disease    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   LIMA to D2, SVG to ramus intermediate, sequential SVG to OM1-OM2, SVG to RCA with EVH via both legs    Sleep apnea    uses O2 at night   Tinea    Type 2 diabetes mellitus with hyperglycemia, with long-term current use of insulin  (HCC) 04/20/2019   Uncontrolled diabetes mellitus 02/2019   Ventricular tachycardia (HCC) 01/16/2015   sustained VT terminated with ATP, CL 340 msec   Vitamin B 12 deficiency 11/05/2018   Vitamin D   deficiency 11/05/2018   Past Surgical History:  Procedure Laterality Date   BI-VENTRICULAR IMPLANTABLE CARDIOVERTER DEFIBRILLATOR UPGRADE N/A 09/25/2012   Procedure: BI-VENTRICULAR IMPLANTABLE CARDIOVERTER DEFIBRILLATOR UPGRADE;  Surgeon: Lynwood JONETTA Rakers, MD;  Location: [facility] CATH LAB;  Service: Cardiovascular;  Laterality: N/A;   BIV ICD GENERATOR CHANGEOUT N/A 12/11/2022   Procedure: BIV ICD GENERATOR CHANGEOUT;  Surgeon: Cindie Ole DASEN, MD;  Location: [facility] INVASIVE CV LAB;  Service: Cardiovascular;  Laterality: N/A;   BIV ICD GENERATOR CHANGEOUT N/A 12/28/2022   Procedure: BIV ICD GENERATOR CHANGEOUT;  Surgeon: Cindie Ole DASEN, MD;  Location: [facility] INVASIVE CV LAB;  Service: Cardiovascular;  Laterality: N/A;   BIV UPGRADE Left 12/28/2022   Procedure: BIV UPGRADE;  Surgeon: Cindie Ole DASEN,  MD;  Location: [facility] INVASIVE CV LAB;  Service: Cardiovascular;  Laterality: Left;   BREAST BIOPSY Right 04/05/2014   CARDIAC CATHETERIZATION     CARDIAC DEFIBRILLATOR PLACEMENT  4/08   by Dr Malva (MDT)   COLONOSCOPY WITH PROPOFOL  N/A 10/12/2016   Procedure: COLONOSCOPY WITH PROPOFOL ;  Surgeon: Legrand Victory LITTIE DOUGLAS, MD;  Location: [facility] ENDOSCOPY;  Service: Gastroenterology;  Laterality: N/A;   CORONARY ARTERY BYPASS GRAFT  10/05/2005   by Dr Lucas   IMPLANTABLE CARDIOVERTER DEFIBRILLATOR IMPLANT  09/25/12   attempt of upgrade to CRT-D unsuccessful due to CS anatomy, SJM Unify Asaura device placed with LV port capped by Dr Kelsie   IR GENERIC HISTORICAL  10/03/2015   IR US  GUIDE VASC ACCESS LEFT 10/03/2015 Wilkie Lent, MD [facility]   IR GENERIC HISTORICAL  10/03/2015   IR VENO/EXT/UNI LEFT 10/03/2015 Wilkie Lent, MD [facility]   IR GENERIC HISTORICAL  10/03/2015   IR VENOCAVAGRAM IVC 10/03/2015 Wilkie Lent, MD [facility]   IR GENERIC HISTORICAL  10/03/2015   IR INFUSION THROMBOL VENOUS INITIAL (MS) 10/03/2015 Wilkie Lent, MD [facility]   IR GENERIC HISTORICAL  10/03/2015   IR US   GUIDE VASC ACCESS LEFT 10/03/2015 Wilkie Lent, MD [facility]   IR GENERIC HISTORICAL  10/04/2015   IR THROMBECT VENO MECH MOD SED 10/04/2015 Norleen Roulette, MD [facility]   IR GENERIC HISTORICAL  10/04/2015   IR TRANSCATH PLC STENT 1ST ART NOT LE CV CAR VERT CAR 10/04/2015 Norleen Roulette, MD [facility]   IR GENERIC HISTORICAL  10/04/2015   IR THROMB F/U EVAL ART/VEN FINAL DAY (MS) 10/04/2015 Norleen Roulette, MD [facility]   IR GENERIC HISTORICAL  11/02/2015   IR RADIOLOGIST EVAL & MGMT 11/02/2015 Norleen Roulette, MD [facility]   IR RADIOLOGIST EVAL & MGMT  04/26/2016   LEFT HEART CATH AND CORS/GRAFTS ANGIOGRAPHY N/A 01/19/2019   Procedure: LEFT HEART CATH AND CORS/GRAFTS ANGIOGRAPHY;  Surgeon: Claudene Victory ORN, MD;  Location: [facility] INVASIVE CV LAB;  Service: Cardiovascular;  Laterality: N/A;   LEFT HEART CATH AND CORS/GRAFTS ANGIOGRAPHY N/A 01/15/2022   Procedure: LEFT HEART CATH AND CORS/GRAFTS ANGIOGRAPHY;  Surgeon: Court Dorn PARAS, MD;  Location: [facility] INVASIVE CV LAB;  Service: Cardiovascular;  Laterality: N/A;   ORIF ANKLE FRACTURE Right 04/17/2023   Procedure: OPEN REDUCTION INTERNAL FIXATION (ORIF) ANKLE FRACTURE;  Surgeon: Barton Drape, MD;  Location: [facility];  Service: Orthopedics;  Laterality: Right;   Current Outpatient Medications on File Prior to Visit  Medication Sig Dispense Refill   ACCU-CHEK GUIDE TEST test strip TEST BLOOD SUGAR THREE TIMES DAILY 300 strip 3   acetaminophen  (TYLENOL ) 500 MG tablet Take 500 mg by mouth 3 (three) times daily as needed for mild pain (pain score 1-3) or moderate pain (pain score 4-6).     amiodarone  (PACERONE ) 100 MG tablet Take 100 mg by mouth daily.     Blood Glucose Monitoring Suppl (ACCU-CHEK GUIDE ME) w/Device KIT Use to check blood sugars 1 kit 0   calcitonin, salmon, (MIACALCIN /FORTICAL) 200 UNIT/ACT nasal spray Place 1 spray into alternate nostrils daily.     Continuous Glucose Sensor (DEXCOM G7 SENSOR) MISC 1 Device by Does not apply route as  directed. 9 each 3   cyanocobalamin (,VITAMIN B-12,) 1000 MCG/ML injection Inject 1,000 mcg into the muscle every 30 (thirty) days.     dextrose  (GLUTOSE) 40 % GEL Take 15 g by mouth as needed for low blood sugar.     ELIQUIS   5 MG TABS tablet TAKE 1 TABLET TWICE DAILY 180 tablet 3   estradiol (ESTRACE) 0.1 MG/GM vaginal cream Place vaginally.     ezetimibe  (ZETIA ) 10 MG tablet Take 1 tablet (10 mg total) by mouth daily. 90 tablet 3   FARXIGA  10 MG TABS tablet TAKE 1 TABLET EVERY DAY 90 tablet 3   ferrous sulfate  325 (65 FE) MG EC tablet Take 325 mg by mouth daily with breakfast.     gabapentin  (NEURONTIN ) 300 MG capsule Take 300 mg by mouth 3 (three) times daily.     glucose blood (ACCU-CHEK GUIDE) test strip Check 3 times daily 100 each 12   Insulin  Pen Needle 31G X 5 MM MISC 1 Device by Does not apply route in the morning, at noon, in the evening, and at bedtime. 400 each 3   ketoconazole (NIZORAL) 2 % cream Apply 1 Application topically daily as needed for irritation.     latanoprost  (XALATAN ) 0.005 % ophthalmic solution Place 1 drop into both eyes at bedtime.     lidocaine  (LIDODERM ) 5 % Place 1 patch onto the skin daily. Remove & Discard patch within 12 hours or as directed by MD 30 patch 0   nitroGLYCERIN  (NITROSTAT ) 0.4 MG SL tablet DISSOLVE 1 TABLET UNDER THE TONGUE EVERY 5 MINUTES AS NEEDED FOR CHEST PAIN 75 tablet 3   NOVOLOG  FLEXPEN 100 UNIT/ML FlexPen INJECT PER SLIDING SCALE AS DIRECTED , MAX 30 UNITS PER DAY. DISCARD PEN 28 DAYS AFTER OPENING 30 mL 3   OXYGEN Inhale 2 L into the lungs continuous.     rosuvastatin  (CRESTOR ) 20 MG tablet Take 1 tablet (20 mg total) by mouth daily at 6 PM. 30 tablet 2   sacubitril -valsartan  (ENTRESTO ) 24-26 MG TAKE 1 TABLET TWICE DAILY 180 tablet 3   TRADJENTA 5 MG TABS tablet Take 5 mg by mouth daily.     TRESIBA  FLEXTOUCH 100 UNIT/ML FlexTouch Pen INJECT 30 UNITS INTO THE SKIN DAILY. 30 mL 3   TRUE METRIX BLOOD GLUCOSE TEST test strip TEST THREE  TIMES DAILY 300 strip 3   Vitamin D , Ergocalciferol , (DRISDOL ) 1.25 MG (50000 UT) CAPS capsule Take 1 capsule (50,000 Units total) by mouth every 7 (seven) days. 4 capsule 0   No current facility-administered medications on file prior to visit.   Allergies  Allergen Reactions   Metformin And Related Itching, Swelling and Other (See Comments)    Leg pain & swelling in legs   Ozempic  (0.25 Or 0.5 Mg-Dose) [Semaglutide (0.25 Or 0.5mg -Dos)] Other (See Comments)    Pt with hx of pancreatitis    Atorvastatin  Calcium  Itching and Rash   Levofloxacin  Itching      PE Today's Vitals   10/22/23 1009  BP: 122/76  Pulse: 66  Temp: 98.2 F (36.8 C)  TempSrc: Oral  SpO2: 97%  Weight: 210 lb (95.3 kg)   Body mass index is 37.2 kg/m.  Physical Exam Vitals reviewed. Exam conducted with a chaperone present.  Constitutional:      General: She is not in acute distress.    Appearance: Normal appearance.     Comments: In wheelchair  HENT:     Head: Normocephalic and atraumatic.     Nose: Nose normal.  Eyes:     Extraocular Movements: Extraocular movements intact.     Conjunctiva/sclera: Conjunctivae normal.  Pulmonary:     Effort: Pulmonary effort is normal.  Genitourinary:    General: Normal vulva.     Exam position: Lithotomy position.  Vagina: Normal. No vaginal discharge.     Cervix: Normal. No cervical motion tenderness, discharge or lesion.     Uterus: Normal. Not enlarged and not tender.      Adnexa: Right adnexa normal and left adnexa normal.  Musculoskeletal:        General: Normal range of motion.     Cervical back: Normal range of motion.  Neurological:     General: No focal deficit present.     Mental Status: She is alert.  Psychiatric:        Mood and Affect: Mood normal.        Behavior: Behavior normal.     10/31/23 TVUS: Indications: Thickened endometrium, right adnexal mass Comparison: 12/26/22 pelvic US   Findings:   Uterus: 8.9 x 4.8 x 3.5 cm,  anteverted uterus. Endometrial thickness: 18 mm, avascular. Left ovary: Not visualized. Right ovary: 5.6 x 3.6 x 4.4 cm, 5.2 x 2.9 cm avascular, septated cyst. No free fluid.  Impression:  Thickened endometrium, stable since 2023 TVUS. Stable adnexal mass, measuring 5.2 cm.  Measured transabdominally.  Vera LULLA Pa, MD   Procedure Consent was signed. Timeout was performed. Speculum inserted into the vagina, cervix visualized and was prepped with Betadine .  Cervical block was performed with 10cc 1% lidocaine . A single-toothed tenaculum was placed on the anterior lip of the cervix to stabilize it.  The 3 mm pipelle was introduced into the endometrial cavity without difficulty to a depth of 9cm, suction initiated and a moderate amount of tissue was obtained over 2 passes and sent to pathology.  The instruments were removed from the patient's vagina.  Minimal bleeding from the cervix was noted.  The patient tolerated the procedure well.     Assessment and Plan:        Adnexal mass  Thickened endometrium -     Surgical pathology -     Lidocaine  HCl (PF)    Patient with known right adnexal lesion, possible cystic neoplasm such as cystadenoma. Followed by [facility] however lost to follow-up due to distance. Last seen in 2024. Imaging and labs reviewed as noted Reviewed updated imaging with patient today, showing stable right adnexal mass and thickened endometrium Last EMB 2023 with scant benign pathology.  Recommended attempted endometrial biopsy which was completed Recommend GYN ONC referral for ongoing surveillance and management  Vera LULLA Pa, MD

## 2023-10-23 ENCOUNTER — Ambulatory Visit (HOSPITAL_BASED_OUTPATIENT_CLINIC_OR_DEPARTMENT_OTHER)
Admission: RE | Admit: 2023-10-23 | Discharge: 2023-10-23 | Disposition: A | Discharge status: Home / Self Care | Source: Ambulatory Visit | Attending: Physician Assistant | Admitting: Physician Assistant

## 2023-10-23 ENCOUNTER — Ambulatory Visit (HOSPITAL_COMMUNITY)
Admission: RE | Admit: 2023-10-23 | Discharge: 2023-10-23 | Disposition: A | Discharge status: Home / Self Care | Source: Ambulatory Visit | Attending: Physician Assistant | Admitting: Physician Assistant

## 2023-10-23 ENCOUNTER — Ambulatory Visit (INDEPENDENT_AMBULATORY_CARE_PROVIDER_SITE_OTHER): Admitting: Physician Assistant

## 2023-10-23 VITALS — BP 164/67 | HR 63 | Temp 97.7°F

## 2023-10-23 DIAGNOSIS — I739 Peripheral vascular disease, unspecified: Secondary | ICD-10-CM | POA: Insufficient documentation

## 2023-10-23 DIAGNOSIS — I871 Compression of vein: Secondary | ICD-10-CM

## 2023-10-23 LAB — VAS US ABI WITH/WO TBI

## 2023-10-23 NOTE — Progress Notes (Signed)
 Office Note     CC:  follow up Requesting Provider:  Cristopher Suzen HERO, NP  HPI: [name] is a 73 y.o. (1950-03-05) female who presents for surveillance of PAD.  She is seen today in a wheelchair however states she states she is able to stand and transfer.  She denies any rest pain or tissue loss of bilateral lower extremities.  She is also being seen for evaluation of bilateral lower extremity swelling.  She has history of bilateral lower extremity DVTs.  She has also had stenting of the left common iliac vein and external iliac vein in September 2017 by interventional radiology.  She wears compression on a regular basis.  She does not report any venous ulcerations recently.  She is on Eliquis  daily.  Past medical history also significant for insulin -dependent diabetes mellitus.  She denies tobacco use.   Past Medical History:  Diagnosis Date   Abnormal LFTs    AICD (automatic cardioverter/defibrillator) present    Angina decubitus 11/06/2012   Angina pectoris    Anxiety    Arthritis    Back pain    Bundle branch block 05/2016   CAD (coronary artery disease) 06/18/2010   CHF (congestive heart failure) (HCC)    Chronic combined systolic and diastolic CHF, NYHA class 3 (HCC)    Chronic combined systolic and diastolic heart failure (HCC)    Chronic systolic dysfunction of left ventricle 09/24/2012   Coronary artery disease    s/p CABG 2007   Depression    Dermal mycosis    Diabetes mellitus    type II   DKA (diabetic ketoacidoses) 01/12/2019   DVT (deep venous thrombosis) (HCC) 09/2015   bilateral   Edema, lower extremity    Glaucoma    Hyperlipidemia    Hypertension    Hypoglycemia 09/09/2012   Insomnia    Insulin  dependent diabetes mellitus (HCC) 04/23/2012   Ischemic cardiomyopathy    EF 30-35%, s/p ICD 4/08 by Dr Malva   Joint pain    LBBB (left bundle branch block) 09/24/2012   Morbid obesity (HCC) 11/06/2012   Obesity    Oxygen dependent 2 L   Pain in  left knee    Peripheral neuropathy    Peripheral vascular disease    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   LIMA to D2, SVG to ramus intermediate, sequential SVG to OM1-OM2, SVG to RCA with EVH via both legs    Sleep apnea    uses O2 at night   Tinea    Type 2 diabetes mellitus with hyperglycemia, with long-term current use of insulin  (HCC) 04/20/2019   Uncontrolled diabetes mellitus 02/2019   Ventricular tachycardia (HCC) 01/16/2015   sustained VT terminated with ATP, CL 340 msec   Vitamin B 12 deficiency 11/05/2018   Vitamin D  deficiency 11/05/2018    Past Surgical History:  Procedure Laterality Date   BI-VENTRICULAR IMPLANTABLE CARDIOVERTER DEFIBRILLATOR UPGRADE N/A 09/25/2012   Procedure: BI-VENTRICULAR IMPLANTABLE CARDIOVERTER DEFIBRILLATOR UPGRADE;  Surgeon: Lynwood JONETTA Rakers, MD;  Location: [facility] CATH LAB;  Service: Cardiovascular;  Laterality: N/A;   BIV ICD GENERATOR CHANGEOUT N/A 12/11/2022   Procedure: BIV ICD GENERATOR CHANGEOUT;  Surgeon: Cindie Ole DASEN, MD;  Location: [facility] INVASIVE CV LAB;  Service: Cardiovascular;  Laterality: N/A;   BIV ICD GENERATOR CHANGEOUT N/A 12/28/2022   Procedure: BIV ICD GENERATOR CHANGEOUT;  Surgeon: Cindie Ole DASEN, MD;  Location: [facility] INVASIVE CV LAB;  Service: Cardiovascular;  Laterality: N/A;   BIV UPGRADE Left 12/28/2022  Procedure: BIV UPGRADE;  Surgeon: Cindie Ole DASEN, MD;  Location: [facility] INVASIVE CV LAB;  Service: Cardiovascular;  Laterality: Left;   BREAST BIOPSY Right 04/05/2014   CARDIAC CATHETERIZATION     CARDIAC DEFIBRILLATOR PLACEMENT  4/08   by Dr Malva (MDT)   COLONOSCOPY WITH PROPOFOL  N/A 10/12/2016   Procedure: COLONOSCOPY WITH PROPOFOL ;  Surgeon: Legrand Victory LITTIE DOUGLAS, MD;  Location: [facility] ENDOSCOPY;  Service: Gastroenterology;  Laterality: N/A;   CORONARY ARTERY BYPASS GRAFT  10/05/2005   by Dr Lucas   IMPLANTABLE CARDIOVERTER DEFIBRILLATOR IMPLANT  09/25/12   attempt of upgrade to CRT-D unsuccessful due to CS anatomy, [facility] Asaura  device placed with LV port capped by Dr Kelsie   IR GENERIC HISTORICAL  10/03/2015   IR US  GUIDE VASC ACCESS LEFT 10/03/2015 Wilkie Lent, MD [facility]   IR GENERIC HISTORICAL  10/03/2015   IR VENO/EXT/UNI LEFT 10/03/2015 Wilkie Lent, MD [facility]   IR GENERIC HISTORICAL  10/03/2015   IR VENOCAVAGRAM IVC 10/03/2015 Wilkie Lent, MD [facility]   IR GENERIC HISTORICAL  10/03/2015   IR INFUSION THROMBOL VENOUS INITIAL (MS) 10/03/2015 Wilkie Lent, MD [facility]   IR GENERIC HISTORICAL  10/03/2015   IR US  GUIDE VASC ACCESS LEFT 10/03/2015 Wilkie Lent, MD [facility]   IR GENERIC HISTORICAL  10/04/2015   IR THROMBECT VENO MECH MOD SED 10/04/2015 Norleen Roulette, MD [facility]   IR GENERIC HISTORICAL  10/04/2015   IR TRANSCATH PLC STENT 1ST ART NOT LE CV CAR VERT CAR 10/04/2015 Norleen Roulette, MD [facility]   IR GENERIC HISTORICAL  10/04/2015   IR THROMB F/U EVAL ART/VEN FINAL DAY (MS) 10/04/2015 Norleen Roulette, MD [facility]   IR GENERIC HISTORICAL  11/02/2015   IR RADIOLOGIST EVAL & MGMT 11/02/2015 Norleen Roulette, MD [facility] INTER[facility]   IR RADIOLOGIST EVAL & MGMT  04/26/2016   LEFT HEART CATH AND CORS/GRAFTS ANGIOGRAPHY N/A 01/19/2019   Procedure: LEFT HEART CATH AND CORS/GRAFTS ANGIOGRAPHY;  Surgeon: Claudene Victory ORN, MD;  Location: [facility] INVASIVE CV LAB;  Service: Cardiovascular;  Laterality: N/A;   LEFT HEART CATH AND CORS/GRAFTS ANGIOGRAPHY N/A 01/15/2022   Procedure: LEFT HEART CATH AND CORS/GRAFTS ANGIOGRAPHY;  Surgeon: Court Dorn PARAS, MD;  Location: [facility] INVASIVE CV LAB;  Service: Cardiovascular;  Laterality: N/A;   ORIF ANKLE FRACTURE Right 04/17/2023   Procedure: OPEN REDUCTION INTERNAL FIXATION (ORIF) ANKLE FRACTURE;  Surgeon: Barton Drape, MD;  Location: [facility] OR[facility];  Service: Orthopedics;  Laterality: Right;    Social History   Socioeconomic History   Marital status: Legally Separated    Spouse name: Not on file   Number of children: 3   Years of education: 17    Highest education level: Not on file  Occupational History   Occupation: retired    Associate Professor: UNEMPLOYED  Tobacco Use   Smoking status: Never   Smokeless tobacco: Never  Vaping Use   Vaping status: Never Used  Substance and Sexual Activity   Alcohol use: No   Drug use: No   Sexual activity: Not Currently  Other Topics Concern   Not on file  Social History Narrative   Disabled Producer, television/film/video.  Currently taking sociology classes at [employer].   11/04/15 Lives with son    caffeine - coffee, 1-2 cups daily   Social Drivers of Health   Financial Resource Strain: Low Risk  (01/15/2022)   Overall Financial Resource Strain (CARDIA)    Difficulty of Paying Living Expenses: Not very hard  Food Insecurity: No Food Insecurity (04/17/2023)   Hunger Vital Sign    Worried About Running Out of Food in the Last Year: Never true    Ran Out of Food in the Last Year: Never true  Transportation Needs: No Transportation Needs (04/17/2023)   PRAPARE - Administrator, Civil Service (Medical): No    Lack of Transportation (Non-Medical): No  Physical Activity: Not on file  Stress: Not on file  Social Connections: Unknown (05/21/2021)   Received from [facility]   Social Network    Social Network: Not on file  Intimate Partner Violence: Not At Risk (04/17/2023)   Humiliation, Afraid, Rape, and Kick questionnaire    Fear of Current or Ex-Partner: No    Emotionally Abused: No    Physically Abused: No    Sexually Abused: No    Family History  Problem Relation Age of Onset   Diabetes Mother 49       died - HTN   Stroke Mother    High blood pressure Mother    Sudden death Mother    Obesity Mother    Other Other        No early family hx of CAD    Current Outpatient Medications  Medication Sig Dispense Refill   ACCU-CHEK GUIDE TEST test strip TEST BLOOD SUGAR THREE TIMES DAILY 300 strip 3   acetaminophen  (TYLENOL ) 500 MG tablet Take 500 mg by mouth 3 (three) times daily as needed for mild  pain (pain score 1-3) or moderate pain (pain score 4-6).     amiodarone  (PACERONE ) 100 MG tablet Take 100 mg by mouth daily.     Blood Glucose Monitoring Suppl (ACCU-CHEK GUIDE ME) w/Device KIT Use to check blood sugars 1 kit 0   calcitonin, salmon, (MIACALCIN /FORTICAL) 200 UNIT/ACT nasal spray Place 1 spray into alternate nostrils daily.     Continuous Glucose Sensor (DEXCOM G7 SENSOR) MISC 1 Device by Does not apply route as directed. 9 each 3   cyanocobalamin (,VITAMIN B-12,) 1000 MCG/ML injection Inject 1,000 mcg into the muscle every 30 (thirty) days.     dextrose  (GLUTOSE) 40 % GEL Take 15 g by mouth as needed for low blood sugar.     ELIQUIS  5 MG TABS tablet TAKE 1 TABLET TWICE DAILY 180 tablet 3   estradiol (ESTRACE) 0.1 MG/GM vaginal cream Place vaginally.     ezetimibe  (ZETIA ) 10 MG tablet Take 1 tablet (10 mg total) by mouth daily. 90 tablet 3   FARXIGA  10 MG TABS tablet TAKE 1 TABLET EVERY DAY 90 tablet 3   ferrous sulfate  325 (65 FE) MG EC tablet Take 325 mg by mouth daily with breakfast.     gabapentin  (NEURONTIN ) 300 MG capsule Take 300 mg by mouth 3 (three) times daily.     glucose blood (ACCU-CHEK GUIDE) test strip Check 3 times daily 100 each 12   Insulin  Pen Needle 31G X 5 MM MISC 1 Device by Does not apply route in the morning, at noon, in the evening, and at bedtime. 400 each 3   ketoconazole (NIZORAL) 2 % cream Apply 1 Application topically daily as needed for irritation.     latanoprost  (XALATAN ) 0.005 % ophthalmic solution Place 1 drop into both eyes at bedtime.     lidocaine  (LIDODERM ) 5 % Place 1 patch onto the skin daily. Remove & Discard patch within 12 hours or as directed by MD 30 patch 0   nitroGLYCERIN  (NITROSTAT ) 0.4 MG SL tablet DISSOLVE  1 TABLET UNDER THE TONGUE EVERY 5 MINUTES AS NEEDED FOR CHEST PAIN 75 tablet 3   NOVOLOG  FLEXPEN 100 UNIT/ML FlexPen INJECT PER SLIDING SCALE AS DIRECTED , MAX 30 UNITS PER DAY. DISCARD PEN 28 DAYS AFTER OPENING 30 mL 3   OXYGEN  Inhale 2 L into the lungs continuous.     rosuvastatin  (CRESTOR ) 20 MG tablet Take 1 tablet (20 mg total) by mouth daily at 6 PM. 30 tablet 2   sacubitril -valsartan  (ENTRESTO ) 24-26 MG TAKE 1 TABLET TWICE DAILY 180 tablet 3   TRADJENTA 5 MG TABS tablet Take 5 mg by mouth daily.     TRESIBA  FLEXTOUCH 100 UNIT/ML FlexTouch Pen INJECT 30 UNITS INTO THE SKIN DAILY. 30 mL 3   TRUE METRIX BLOOD GLUCOSE TEST test strip TEST THREE TIMES DAILY 300 strip 3   Vitamin D , Ergocalciferol , (DRISDOL ) 1.25 MG (50000 UT) CAPS capsule Take 1 capsule (50,000 Units total) by mouth every 7 (seven) days. 4 capsule 0   Current Facility-Administered Medications  Medication Dose Route Frequency Provider Last Rate Last Admin   lidocaine  (PF) (XYLOCAINE ) 1 % injection 10 mL  10 mL Infiltration Once         Allergies  Allergen Reactions   Metformin And Related Itching, Swelling and Other (See Comments)    Leg pain & swelling in legs   Ozempic  (0.25 Or 0.5 Mg-Dose) [Semaglutide (0.25 Or 0.5mg -Dos)] Other (See Comments)    Pt with hx of pancreatitis    Atorvastatin  Calcium  Itching and Rash   Levofloxacin  Itching     REVIEW OF SYSTEMS:  Negative unless noted in HPI [X]  denotes positive finding, [ ]  denotes negative finding Cardiac  Comments:  Chest pain or chest pressure:    Shortness of breath upon exertion:    Short of breath when lying flat:    Irregular heart rhythm:        Vascular    Pain in calf, thigh, or hip brought on by ambulation:    Pain in feet at night that wakes you up from your sleep:     Blood clot in your veins:    Leg swelling:         Pulmonary    Oxygen at home:    Productive cough:     Wheezing:         Neurologic    Sudden weakness in arms or legs:     Sudden numbness in arms or legs:     Sudden onset of difficulty speaking or slurred speech:    Temporary loss of vision in one eye:     Problems with dizziness:         Gastrointestinal    Blood in stool:     Vomited blood:          Genitourinary    Burning when urinating:     Blood in urine:        Psychiatric    Major depression:         Hematologic    Bleeding problems:    Problems with blood clotting too easily:        Skin    Rashes or ulcers:        Constitutional    Fever or chills:      PHYSICAL EXAMINATION:  Vitals:   10/23/23 1005  BP: (!) 164/67  Pulse: 63  Temp: 97.7 F (36.5 C)  TempSrc: Temporal    General:  WDWN in NAD; vital signs documented above Gait:  Not observed HENT: WNL, normocephalic Pulmonary: normal non-labored breathing Cardiac: regular HR Abdomen: soft, NT, no masses Skin: without rashes Vascular Exam/Pulses: Right PT brisk by Doppler; left DP and PT brisk by Doppler Extremities: without ischemic changes, without Gangrene , without cellulitis; without open wounds;  Musculoskeletal: no muscle wasting or atrophy  Neurologic: A&O X 3 Psychiatric:  The pt has Normal affect.   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 female here for follow up for surveillance of PAD and bilateral lower extremity edema  Ms. Wooton seems to be doing well.  She has no rest pain or tissue loss of bilateral lower extremities.  Her tibials are noncompressible however her feet are warm to touch with adequate toe pressure.  We will repeat ABIs in 1 year.  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  She also has history of bilateral lower extremity DVTs.  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  On exam she does not have any significant swelling and does not have any venous ulceration.  Recommend continued use of compression on a regular basis.  We also discussed proper leg elevation to be performed periodically during the day.  We will repeat IVC/iliac venous duplex in 1 year.   Donnice Sender, PA-C [facility] [phone]  Clinic MD:   She[place]

## 2023-10-24 ENCOUNTER — Ambulatory Visit: Payer: Self-pay | Admitting: Obstetrics and Gynecology

## 2023-10-24 LAB — SURGICAL PATHOLOGY

## 2023-10-25 ENCOUNTER — Telehealth: Payer: Self-pay | Admitting: *Deleted

## 2023-10-25 NOTE — Telephone Encounter (Signed)
 Patient called at 1518 requesting UA results from visit on 10/22/23.   Spoke with patient. Patient states she left urine sample at front office during her visit, asking for results.   10/22/23 OV notes reviewed. No UA ordered.  Advised patient. Patient states she did not discuss symptoms with Dr. Dallie, assumed urine would be tested.   Patient reports pain with urination for the past month along with urinary frequency. Patient reports chronic back pain, managed by ortho. Denies fever/chills, N/V, blood in urine.   Patient states she will contact PCP or discuss with GYN ONC on Monday. ER/Urgent care precautions provided for new or worsening symptoms, advised do not wait for OV. Patient verbalizes understanding. Patient also advised of 10/24/23 pathology results.   Routing to provider for final review. Patient is agreeable to disposition. Will close encounter.

## 2023-10-28 ENCOUNTER — Inpatient Hospital Stay: Attending: Psychiatry | Admitting: Psychiatry

## 2023-10-28 ENCOUNTER — Encounter: Payer: Self-pay | Admitting: Psychiatry

## 2023-10-28 ENCOUNTER — Other Ambulatory Visit: Payer: Self-pay | Admitting: Psychiatry

## 2023-10-28 ENCOUNTER — Inpatient Hospital Stay

## 2023-10-28 ENCOUNTER — Ambulatory Visit: Attending: Cardiology

## 2023-10-28 VITALS — BP 104/91 | HR 60 | Temp 97.8°F | Resp 20 | Ht 63.0 in | Wt 230.0 lb

## 2023-10-28 DIAGNOSIS — I509 Heart failure, unspecified: Secondary | ICD-10-CM | POA: Insufficient documentation

## 2023-10-28 DIAGNOSIS — I255 Ischemic cardiomyopathy: Secondary | ICD-10-CM | POA: Insufficient documentation

## 2023-10-28 DIAGNOSIS — E119 Type 2 diabetes mellitus without complications: Secondary | ICD-10-CM | POA: Diagnosis not present

## 2023-10-28 DIAGNOSIS — N840 Polyp of corpus uteri: Secondary | ICD-10-CM | POA: Insufficient documentation

## 2023-10-28 DIAGNOSIS — Z7901 Long term (current) use of anticoagulants: Secondary | ICD-10-CM | POA: Insufficient documentation

## 2023-10-28 DIAGNOSIS — E1151 Type 2 diabetes mellitus with diabetic peripheral angiopathy without gangrene: Secondary | ICD-10-CM | POA: Diagnosis not present

## 2023-10-28 DIAGNOSIS — Z95810 Presence of automatic (implantable) cardiac defibrillator: Secondary | ICD-10-CM

## 2023-10-28 DIAGNOSIS — F419 Anxiety disorder, unspecified: Secondary | ICD-10-CM | POA: Insufficient documentation

## 2023-10-28 DIAGNOSIS — E538 Deficiency of other specified B group vitamins: Secondary | ICD-10-CM | POA: Diagnosis not present

## 2023-10-28 DIAGNOSIS — R9389 Abnormal findings on diagnostic imaging of other specified body structures: Secondary | ICD-10-CM | POA: Diagnosis not present

## 2023-10-28 DIAGNOSIS — I110 Hypertensive heart disease with heart failure: Secondary | ICD-10-CM | POA: Insufficient documentation

## 2023-10-28 DIAGNOSIS — E559 Vitamin D deficiency, unspecified: Secondary | ICD-10-CM | POA: Diagnosis not present

## 2023-10-28 DIAGNOSIS — I2510 Atherosclerotic heart disease of native coronary artery without angina pectoris: Secondary | ICD-10-CM | POA: Insufficient documentation

## 2023-10-28 DIAGNOSIS — E785 Hyperlipidemia, unspecified: Secondary | ICD-10-CM | POA: Insufficient documentation

## 2023-10-28 DIAGNOSIS — I5042 Chronic combined systolic (congestive) and diastolic (congestive) heart failure: Secondary | ICD-10-CM

## 2023-10-28 DIAGNOSIS — R300 Dysuria: Secondary | ICD-10-CM

## 2023-10-28 DIAGNOSIS — Z86718 Personal history of other venous thrombosis and embolism: Secondary | ICD-10-CM | POA: Insufficient documentation

## 2023-10-28 DIAGNOSIS — Z7984 Long term (current) use of oral hypoglycemic drugs: Secondary | ICD-10-CM | POA: Diagnosis not present

## 2023-10-28 DIAGNOSIS — M1990 Unspecified osteoarthritis, unspecified site: Secondary | ICD-10-CM | POA: Diagnosis not present

## 2023-10-28 DIAGNOSIS — R350 Frequency of micturition: Secondary | ICD-10-CM | POA: Insufficient documentation

## 2023-10-28 DIAGNOSIS — R1900 Intra-abdominal and pelvic swelling, mass and lump, unspecified site: Secondary | ICD-10-CM | POA: Diagnosis not present

## 2023-10-28 DIAGNOSIS — Z6841 Body Mass Index (BMI) 40.0 and over, adult: Secondary | ICD-10-CM | POA: Insufficient documentation

## 2023-10-28 DIAGNOSIS — Z951 Presence of aortocoronary bypass graft: Secondary | ICD-10-CM | POA: Diagnosis not present

## 2023-10-28 DIAGNOSIS — Z79899 Other long term (current) drug therapy: Secondary | ICD-10-CM | POA: Insufficient documentation

## 2023-10-28 DIAGNOSIS — N3000 Acute cystitis without hematuria: Secondary | ICD-10-CM

## 2023-10-28 LAB — URINALYSIS, COMPLETE (UACMP) WITH MICROSCOPIC
Bilirubin Urine: NEGATIVE
Glucose, UA: >=500 mg/dL — AB
Ketones, ur: NEGATIVE mg/dL
Nitrite: NEGATIVE
Protein, ur: NEGATIVE mg/dL
RBC / HPF: >50 RBC/hpf (ref 0–5)
Specific Gravity, Urine: 1.028 (ref 1.005–1.030)
pH: 5 (ref 5.0–8.0)

## 2023-10-28 MED ORDER — NITROFURANTOIN MONOHYD MACRO 100 MG PO CAPS: 100.0000 mg | ORAL_CAPSULE | Freq: Two times a day (BID) | ORAL | 0 refills | Status: AC

## 2023-10-28 NOTE — Telephone Encounter (Signed)
 Per review of EPIC, patient was seen by GYN ONC today, UA and culture collected, to treat based on results.

## 2023-10-28 NOTE — Progress Notes (Signed)
 GYNECOLOGIC ONCOLOGY NEW PATIENT CONSULTATION  Date of Service: 10/28/2023 Referring Provider: Vera Pa, MD   ASSESSMENT AND PLAN: Donnabelle Blanchard is a 73 y.o. woman with stable cystic right adnexal mass and endometrial polyp.  Reviewed workup and surveillance to date.  Patient most recently had an endometrial biopsy with Dr. Pa and this returned benign with an endometrial polyp.  She continues to have some spotting since then but overall reassuring.  Do not feel that patient warrants any additional workup of thickened endometrium unless she has recurrent bleeding given 2 negative endometrial biopsies.  In terms of her cystic adnexal mass, we reviewed that this has been present since at least 2021.  This is overall benign in appearance with normal tumor markers and overall stable in size.  I reviewed considerations of observation based on symptoms alone versus continuing with surveillance with pelvic ultrasound.  Reviewed the pros and cons of each.  Patient wishes to continue with pelvic ultrasounds.  These can be continued every 6 to 12 months.  Reviewed that if this was to change, we would likely still not necessarily recommend surgical intervention and patient does not wish to undergo surgical intervention.  Given this, changes would just warrant us  to have a potential goals of care discussion regarding management of symptoms if present.  Given that her workup today has been reassuring do not feel that she needs close surveillance with oncology but could be considered ongoing with benign gynecology.  Patient reports that she would prefer to come to our clinic if possible, so we will transition her care to Dr. Olam Mill.  She can be referred back to me as needed if concern on imaging or symptoms otherwise.  Will get initial follow-up in 6 months with Dr. Mill.  In terms of dysuria, will seen urinalysis and urine culture today. Treat as indicated.   A copy of this note  was sent to the patient's referring provider.  Hoy Masters, MD Gynecologic Oncology   Medical Decision Making I personally spent  TOTAL 45 minutes face-to-face and non-face-to-face in the care of this patient, which includes all pre, intra, and post visit time on the date of service.   ------------  CC: pelvic mass  HISTORY OF PRESENT ILLNESS:  Latoy Maggy Wyble is a 73 y.o. woman who is seen in consultation at the request of Vera Pa, MD for evaluation of pelvic mass, thickened endometrium.  This patient was previously evaluated at [facility] by Dr. Sonny Gaskins, last seen on 12/26/2022.  Patient has a past medical history notable for type 2 diabetes with history of DKA, CHF, CAD with cardiac catheterization, DVT, AICD in place, left bundle branch block who initially was evaluated in the setting of pelvic mass and thickened endometrial stripe.  Patient denied history of postmenopausal bleeding.  However given thickened stripe on imaging she had an endometrial biopsy performed on 06/06/2021 which showed extremely scant fragments of superficial inactive appearing endometrial glands and benign endocervical glands.  In regards to the adnexal mass, this has been noted since 2021 on imaging and was 4 cm at that time.  She subsequently had an MRI on 09/21/2021 which noted a multiloculated lesion in the right ovary measuring 6.7 cm with nonenhancing solid component entheses a RADS MRI 3).  A subsequent pelvic ultrasound on 04/04/2022 noted the right ovarian cystic lesion to measure 8.4 cm and an endometrial stripe of 1.5 cm.  She had previously had tumor markers drawn on 06/06/2021 which noted a normal CA125  of 12 as well as a normal H E4 117, and normal inhibin A/B.  At her last visit with Dr. Gretta, it was discussed that surgical risks outweigh medical risk for removal of the adnexal lesion.  In consideration of her thickened endometrial stripe recommendation was for repeat endometrial biopsy if any  new bleeding.  Initially patient was going to continue with care every 6 months with Dr. Gretta, but patient preferred to continue follow-up in [place].  At that time Dr. Gretta recommended that this was reasonable given that the mass was likely benign.  Patient had since seen her local gynecologist on 10/22/2023 at which time an endometrial biopsy was obtained.  This returned with inactive endometrium, benign endometrial polyp, negative for EIN or malignancy.  She also underwent a pelvic ultrasound on 10/22/2023 which noted an endometrial thickness of 1.8 cm and a right ovarian mass measuring 5.2 x 2.9 cm, avascular, septated, stable.  Today patient reports that she is overall stable since last time she saw Dr. Gretta.  Denies any pelvic pain.  Has had some spotting after the biopsy that is ongoing.  Otherwise denies abdominal bloating, early satiety, significant weight loss, change in bowel habits.  Does note urinary symptoms including urinary frequency and burning for the past month or so.  Has not seen her PCP for this.  Reports that she mentioned this at the OB/GYN office but does not believe her urine was tested.   PAST MEDICAL HISTORY: Past Medical History:  Diagnosis Date   Abnormal LFTs    AICD (automatic cardioverter/defibrillator) present    Angina decubitus 11/06/2012   Angina pectoris    Anxiety    Arthritis    Back pain    Bundle branch block 05/2016   CAD (coronary artery disease) 06/18/2010   CHF (congestive heart failure) (HCC)    Chronic combined systolic and diastolic CHF, NYHA class 3 (HCC)    Chronic combined systolic and diastolic heart failure (HCC)    Chronic systolic dysfunction of left ventricle 09/24/2012   Coronary artery disease    s/p CABG 2007   Depression    Dermal mycosis    Diabetes mellitus    type II   DKA (diabetic ketoacidoses) 01/12/2019   DVT (deep venous thrombosis) (HCC) 09/2015   bilateral   Edema, lower extremity    Glaucoma     Hyperlipidemia    Hypertension    Hypoglycemia 09/09/2012   Insomnia    Insulin  dependent diabetes mellitus (HCC) 04/23/2012   Ischemic cardiomyopathy    EF 30-35%, s/p ICD 4/08 by Dr Malva   Joint pain    LBBB (left bundle branch block) 09/24/2012   Morbid obesity (HCC) 11/06/2012   Obesity    Oxygen dependent 2 L   Pain in left knee    Peripheral neuropathy    Peripheral vascular disease    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   LIMA to D2, SVG to ramus intermediate, sequential SVG to OM1-OM2, SVG to RCA with EVH via both legs    Sleep apnea    uses O2 at night   Tinea    Type 2 diabetes mellitus with hyperglycemia, with long-term current use of insulin  (HCC) 04/20/2019   Uncontrolled diabetes mellitus 02/2019   Ventricular tachycardia (HCC) 01/16/2015   sustained VT terminated with ATP, CL 340 msec   Vitamin B 12 deficiency 11/05/2018   Vitamin D  deficiency 11/05/2018    PAST SURGICAL HISTORY: Past Surgical History:  Procedure Laterality Date  BI-VENTRICULAR IMPLANTABLE CARDIOVERTER DEFIBRILLATOR UPGRADE N/A 09/25/2012   Procedure: BI-VENTRICULAR IMPLANTABLE CARDIOVERTER DEFIBRILLATOR UPGRADE;  Surgeon: Lynwood JONETTA Rakers, MD;  Location: [facility] CATH LAB;  Service: Cardiovascular;  Laterality: N/A;   BIV ICD GENERATOR CHANGEOUT N/A 12/11/2022   Procedure: BIV ICD GENERATOR CHANGEOUT;  Surgeon: Cindie Ole DASEN, MD;  Location: [facility] INVASIVE CV LAB;  Service: Cardiovascular;  Laterality: N/A;   BIV ICD GENERATOR CHANGEOUT N/A 12/28/2022   Procedure: BIV ICD GENERATOR CHANGEOUT;  Surgeon: Cindie Ole DASEN, MD;  Location: [facility] INVASIVE CV LAB;  Service: Cardiovascular;  Laterality: N/A;   BIV UPGRADE Left 12/28/2022   Procedure: BIV UPGRADE;  Surgeon: Cindie Ole DASEN, MD;  Location: [facility] INVASIVE CV LAB;  Service: Cardiovascular;  Laterality: Left;   BREAST BIOPSY Right 04/05/2014   CARDIAC CATHETERIZATION     CARDIAC DEFIBRILLATOR PLACEMENT  4/08   by Dr Malva (MDT)   COLONOSCOPY WITH  PROPOFOL  N/A 10/12/2016   Procedure: COLONOSCOPY WITH PROPOFOL ;  Surgeon: Legrand Victory LITTIE DOUGLAS, MD;  Location: [facility];  Service: Gastroenterology;  Laterality: N/A;   CORONARY ARTERY BYPASS GRAFT  10/05/2005   by Dr Lucas   IMPLANTABLE CARDIOVERTER DEFIBRILLATOR IMPLANT  09/25/12   attempt of upgrade to CRT-D unsuccessful due to CS anatomy, SJM Unify Asaura device placed with LV port capped by Dr Rakers   IR GENERIC HISTORICAL  10/03/2015   IR US  GUIDE VASC ACCESS LEFT 10/03/2015 Wilkie Lent, MD [facility]   IR GENERIC HISTORICAL  10/03/2015   IR VENO/EXT/UNI LEFT 10/03/2015 Wilkie Lent, MD [facility]   IR GENERIC HISTORICAL  10/03/2015   IR VENOCAVAGRAM IVC 10/03/2015 Wilkie Lent, MD [facility]   IR GENERIC HISTORICAL  10/03/2015   IR INFUSION THROMBOL VENOUS INITIAL (MS) 10/03/2015 Wilkie Lent, MD [facility]   IR GENERIC HISTORICAL  10/03/2015   IR US  GUIDE VASC ACCESS LEFT 10/03/2015 Wilkie Lent, MD [facility]   IR GENERIC HISTORICAL  10/04/2015   IR THROMBECT VENO MECH MOD SED 10/04/2015 Norleen Roulette, MD [facility]   IR GENERIC HISTORICAL  10/04/2015   IR TRANSCATH PLC STENT 1ST ART NOT LE CV CAR VERT CAR 10/04/2015 Norleen Roulette, MD [facility]   IR GENERIC HISTORICAL  10/04/2015   IR THROMB F/U EVAL ART/VEN FINAL DAY (MS) 10/04/2015 Norleen Roulette, MD [facility]   IR GENERIC HISTORICAL  11/02/2015   IR RADIOLOGIST EVAL & MGMT 11/02/2015 Norleen Roulette, MD [facility] INTER[facility]   IR RADIOLOGIST EVAL & MGMT  04/26/2016   LEFT HEART CATH AND CORS/GRAFTS ANGIOGRAPHY N/A 01/19/2019   Procedure: LEFT HEART CATH AND CORS/GRAFTS ANGIOGRAPHY;  Surgeon: Claudene Victory ORN, MD;  Location: [facility] INVASIVE CV LAB;  Service: Cardiovascular;  Laterality: N/A;   LEFT HEART CATH AND CORS/GRAFTS ANGIOGRAPHY N/A 01/15/2022   Procedure: LEFT HEART CATH AND CORS/GRAFTS ANGIOGRAPHY;  Surgeon: Court Dorn PARAS, MD;  Location: [facility] INVASIVE CV LAB;  Service: Cardiovascular;  Laterality: N/A;   ORIF  ANKLE FRACTURE Right 04/17/2023   Procedure: OPEN REDUCTION INTERNAL FIXATION (ORIF) ANKLE FRACTURE;  Surgeon: Barton Drape, MD;  Location: [facility];  Service: Orthopedics;  Laterality: Right;    OB/GYN HISTORY: OB History  Gravida Para Term Preterm AB Living  4 4 4   4   SAB IAB Ectopic Multiple Live Births      4    # Outcome Date GA Lbr Len/2nd Weight Sex Type Anes PTL Lv  4 Term      Vag-Spont   LIV  3 Term  Vag-Spont   LIV  2 Term      Vag-Spont   LIV  1 Term      Vag-Spont   LIV      Age at menarche: 60/10 Age at menopause: 46 Hx of HRT: no Hx of STI: no Last pap: unknown History of abnormal pap smears: no  SCREENING STUDIES:  Last mammogram: 04/2022 Last colonoscopy: 10/2016  MEDICATIONS:  Current Outpatient Medications:    ACCU-CHEK GUIDE TEST test strip, TEST BLOOD SUGAR THREE TIMES DAILY, Disp: 300 strip, Rfl: 3   acetaminophen  (TYLENOL ) 500 MG tablet, Take 500 mg by mouth 3 (three) times daily as needed for mild pain (pain score 1-3) or moderate pain (pain score 4-6)., Disp: , Rfl:    amiodarone  (PACERONE ) 100 MG tablet, Take 100 mg by mouth daily., Disp: , Rfl:    Blood Glucose Monitoring Suppl (ACCU-CHEK GUIDE ME) w/Device KIT, Use to check blood sugars, Disp: 1 kit, Rfl: 0   calcitonin, salmon, (MIACALCIN /FORTICAL) 200 UNIT/ACT nasal spray, Place 1 spray into alternate nostrils daily., Disp: , Rfl:    Continuous Glucose Sensor (DEXCOM G7 SENSOR) MISC, 1 Device by Does not apply route as directed., Disp: 9 each, Rfl: 3   cyanocobalamin (,VITAMIN B-12,) 1000 MCG/ML injection, Inject 1,000 mcg into the muscle every 30 (thirty) days., Disp: , Rfl:    dextrose  (GLUTOSE) 40 % GEL, Take 15 g by mouth as needed for low blood sugar., Disp: , Rfl:    ELIQUIS  5 MG TABS tablet, TAKE 1 TABLET TWICE DAILY, Disp: 180 tablet, Rfl: 3   estradiol (ESTRACE) 0.1 MG/GM vaginal cream, Place vaginally., Disp: , Rfl:    ezetimibe  (ZETIA ) 10 MG tablet, Take 1 tablet (10 mg total) by  mouth daily., Disp: 90 tablet, Rfl: 3   FARXIGA  10 MG TABS tablet, TAKE 1 TABLET EVERY DAY, Disp: 90 tablet, Rfl: 3   ferrous sulfate  325 (65 FE) MG EC tablet, Take 325 mg by mouth daily with breakfast., Disp: , Rfl:    gabapentin  (NEURONTIN ) 300 MG capsule, Take 300 mg by mouth 3 (three) times daily., Disp: , Rfl:    glucose blood (ACCU-CHEK GUIDE) test strip, Check 3 times daily, Disp: 100 each, Rfl: 12   Insulin  Pen Needle 31G X 5 MM MISC, 1 Device by Does not apply route in the morning, at noon, in the evening, and at bedtime., Disp: 400 each, Rfl: 3   ketoconazole (NIZORAL) 2 % cream, Apply 1 Application topically daily as needed for irritation., Disp: , Rfl:    latanoprost  (XALATAN ) 0.005 % ophthalmic solution, Place 1 drop into both eyes at bedtime., Disp: , Rfl:    lidocaine  (LIDODERM ) 5 %, Place 1 patch onto the skin daily. Remove & Discard patch within 12 hours or as directed by MD, Disp: 30 patch, Rfl: 0   nitroGLYCERIN  (NITROSTAT ) 0.4 MG SL tablet, DISSOLVE 1 TABLET UNDER THE TONGUE EVERY 5 MINUTES AS NEEDED FOR CHEST PAIN, Disp: 75 tablet, Rfl: 3   NOVOLOG  FLEXPEN 100 UNIT/ML FlexPen, INJECT PER SLIDING SCALE AS DIRECTED , MAX 30 UNITS PER DAY. DISCARD PEN 28 DAYS AFTER OPENING, Disp: 30 mL, Rfl: 3   OXYGEN, Inhale 2 L into the lungs continuous., Disp: , Rfl:    rosuvastatin  (CRESTOR ) 20 MG tablet, Take 1 tablet (20 mg total) by mouth daily at 6 PM., Disp: 30 tablet, Rfl: 2   sacubitril -valsartan  (ENTRESTO ) 24-26 MG, TAKE 1 TABLET TWICE DAILY, Disp: 180 tablet, Rfl: 3   TRADJENTA 5 MG  TABS tablet, Take 5 mg by mouth daily., Disp: , Rfl:    TRESIBA  FLEXTOUCH 100 UNIT/ML FlexTouch Pen, INJECT 30 UNITS INTO THE SKIN DAILY., Disp: 30 mL, Rfl: 3   TRUE METRIX BLOOD GLUCOSE TEST test strip, TEST THREE TIMES DAILY, Disp: 300 strip, Rfl: 3   Vitamin D , Ergocalciferol , (DRISDOL ) 1.25 MG (50000 UT) CAPS capsule, Take 1 capsule (50,000 Units total) by mouth every 7 (seven) days., Disp: 4 capsule,  Rfl: 0  Current Facility-Administered Medications:    lidocaine  (PF) (XYLOCAINE ) 1 % injection 10 mL, 10 mL, Infiltration, Once,   ALLERGIES: Allergies  Allergen Reactions   Metformin And Related Itching, Swelling and Other (See Comments)    Leg pain & swelling in legs   Ozempic  (0.25 Or 0.5 Mg-Dose) [Semaglutide (0.25 Or 0.5mg -Dos)] Other (See Comments)    Pt with hx of pancreatitis    Atorvastatin  Calcium  Itching and Rash   Levofloxacin  Itching    FAMILY HISTORY: Family History  Problem Relation Age of Onset   Diabetes Mother 22       died - HTN   Stroke Mother    High blood pressure Mother    Sudden death Mother    Obesity Mother    Other Other        No early family hx of CAD   Breast cancer Neg Hx    Ovarian cancer Neg Hx    Colon cancer Neg Hx    Endometrial cancer Neg Hx     SOCIAL HISTORY: Social History   Socioeconomic History   Marital status: Legally Separated    Spouse name: Not on file   Number of children: 3   Years of education: 17   Highest education level: Not on file  Occupational History   Occupation: retired    Associate Professor: UNEMPLOYED  Tobacco Use   Smoking status: Never   Smokeless tobacco: Never  Vaping Use   Vaping status: Never Used  Substance and Sexual Activity   Alcohol use: No   Drug use: No   Sexual activity: Not Currently  Other Topics Concern   Not on file  Social History Narrative   Disabled Producer, television/film/video.  Currently taking sociology classes at [employer].   11/04/15 Lives with son    caffeine - coffee, 1-2 cups daily   Social Drivers of Health   Financial Resource Strain: Low Risk  (01/15/2022)   Overall Financial Resource Strain (CARDIA)    Difficulty of Paying Living Expenses: Not very hard  Food Insecurity: No Food Insecurity (04/17/2023)   Hunger Vital Sign    Worried About Running Out of Food in the Last Year: Never true    Ran Out of Food in the Last Year: Never true  Transportation Needs: No Transportation Needs (04/17/2023)    PRAPARE - Administrator, Civil Service (Medical): No    Lack of Transportation (Non-Medical): No  Physical Activity: Not on file  Stress: Not on file  Social Connections: Unknown (05/21/2021)   [facility]   Social Network    Social Network: Not on file  Intimate Partner Violence: Not At Risk (04/17/2023)   Humiliation, Afraid, Rape, and Kick questionnaire    Fear of Current or Ex-Partner: No    Emotionally Abused: No    Physically Abused: No    Sexually Abused: No    REVIEW OF SYSTEMS: New patient intake form was reviewed.  Complete 10-system review is negative except for the following: Appetite changes, shortness of breath,  urinary frequency, left knee pain, vaginal spotting, urinary incontinence, back pain, leg swelling, dysuria, itching, problem walking  PHYSICAL EXAM: BP (!) 147/73 (BP Location: Left Arm, Patient Position: Sitting)   Pulse 60   Temp 97.8 F (36.6 C) (Oral)   Resp 20   Ht 5' 3 (1.6 m)   Wt 230 lb (104.3 kg)   SpO2 98%   BMI 40.74 kg/m  Constitutional: No acute distress. Neuro/Psych: Alert, oriented.  Head and Neck: Normocephalic, atraumatic. Neck symmetric without masses. Sclera anicteric.  Respiratory: Normal work of breathing. Clear to auscultation bilaterally. Cardiovascular: Regular rate and rhythm, no murmurs, rubs, or gallops. Abdomen: Normoactive bowel sounds. Soft, non-distended, non-tender to palpation. No masses appreciated.  Umbilical hernia, reducible Extremities: Grossly normal range of motion. Warm, well perfused. No edema bilaterally. Skin: Chronic skin changes under pannus and in bilateral groins, inner thighs Lymphatic: No cervical, supraclavicular, or inguinal adenopathy. Genitourinary: External genitalia without lesions. Urethral meatus without lesions or prolapse. On speculum exam, vagina and cervix without lesions.  Small blood in vaginal vault.  Silver nitrate applied to cervix at likely prior tenaculum  sites.  Bimanual exam reveals normal cervix and small mobile uterus.  No palpable adnexal mass but exam limited by body habitus.  No nodularity.  Exam chaperoned by Eleanor Epps, NP   LABORATORY AND RADIOLOGIC DATA: Outside medical records were reviewed to synthesize the above history, along with the history and physical obtained during the visit.  Outside laboratory, pathology, and imaging reports were reviewed, with pertinent results below.  I personally reviewed the outside images.  WBC  Date Value Ref Range Status  04/20/2023 7.2 4.0 - 10.5 K/uL Final   Hemoglobin  Date Value Ref Range Status  04/20/2023 12.1 12.0 - 15.0 g/dL Final  87/94/7975 85.6 11.1 - 15.9 g/dL Final   HCT  Date Value Ref Range Status  04/20/2023 38.5 36.0 - 46.0 % Final   Hematocrit  Date Value Ref Range Status  12/13/2022 45.4 34.0 - 46.6 % Final   Platelets  Date Value Ref Range Status  04/20/2023 111 (L) 150 - 400 K/uL Final  12/13/2022 144 (L) 150 - 450 x10E3/uL Final   LDH  Date Value Ref Range Status  08/16/2021 225 (H) 98 - 192 U/L Final    Comment:    Performed at [facility], [address] [place], [place] [address]   Magnesium   Date Value Ref Range Status  04/20/2023 2.0 1.7 - 2.4 mg/dL Final    Comment:    Performed at [facility], [address], [place], [place] [address]   Creat  Date Value Ref Range Status  09/20/2015 0.88 0.50 - 0.99 mg/dL Final    Comment:      For patients > or = 73 years of age: The upper reference limit for Creatinine is approximately 13% higher for people identified as African-American.      Creatinine, Ser  Date Value Ref Range Status  04/20/2023 1.02 (H) 0.44 - 1.00 mg/dL Final   AST  Date Value Ref Range Status  04/20/2023 23 15 - 41 U/L Final   ALT  Date Value Ref Range Status  04/20/2023 22 0 - 44 U/L Final   Cancer Antigen (CA) 125  Date Value Ref Range Status  01/05/2021 14.6 0.0 - 38.1 U/mL Final     Comment:    Roche [employer]ostics Electrochemiluminescence Immunoassay (ECLIA) Values obtained with different assay methods or kits cannot be used interchangeably.  Results  cannot be interpreted as absolute evidence of the presence or absence of malignant disease.    Tumor marker (05/2021): CA 125: 12 HE4: 117 (normal) Inhibin B: <10 Inhibin A: 2 (normal)    Surgical pathology (10/22/23): A. ENDOMETRIUM, BIOPSY:  Inactive endometrium.  Benign endometrial polyp.  Negative for endometrial intraepithelial neoplasia (EIN) and malignancy.    US  PELVIC COMPLETE WITH TRANSVAGINAL 10/22/2023  Narrative Indications: Thickened endometrium, right adnexal mass Comparison: 12/26/22 pelvic US   Findings:  Uterus: 8.9 x 4.8 x 3.5 cm, anteverted uterus. Endometrial thickness: 18 mm, avascular. Left ovary: Not visualized. Right ovary: 5.6 x 3.6 x 4.4 cm, 5.2 x 2.9 cm avascular, septated cyst. No free fluid.  Impression: Thickened endometrium, stable since 2023 TVUS. Stable adnexal mass, measuring 5.2 cm.  Measured transabdominally.  Vera LULLA Pa, MD

## 2023-10-28 NOTE — Telephone Encounter (Signed)
 Pt called back to say she needs someone to please call her back, ASAP, regarding her results. [phone]

## 2023-10-28 NOTE — Patient Instructions (Signed)
 Plan to follow up with Dr. Rogelio in six months at our office.   Symptoms to report to your health care team include vaginal bleeding, rectal bleeding, bloating, weight loss without effort, new and persistent pain, new and  persistent fatigue, new leg swelling, new masses (i.e., bumps in your neck or groin), new and persistent cough, new and persistent nausea and vomiting, change in bowel or bladder habits, and any other concerns.

## 2023-10-28 NOTE — Telephone Encounter (Signed)
 Call returned to patient, left message to call [facility] triage at [phone], opt 4.

## 2023-10-29 ENCOUNTER — Encounter (HOSPITAL_BASED_OUTPATIENT_CLINIC_OR_DEPARTMENT_OTHER): Payer: Self-pay

## 2023-10-29 ENCOUNTER — Telehealth: Payer: Self-pay

## 2023-10-29 NOTE — Telephone Encounter (Signed)
 Attempted call to patient.  Left message for patient to send manual remote transmission to check monthly fluid levels.

## 2023-10-29 NOTE — Progress Notes (Signed)
 EPIC Encounter for ICM Monitoring  Patient Name: [name] is a 73 y.o. female Date: 10/29/2023 Primary Care Physican: Cristopher Suzen HERO, NP Primary Cardiologist: Skains/Weaver PA  Electrophysiologist: Cindie BiV Pacing: 95% (08/12/2023 report) 04/16/2022 Weight: 230 lbs 05/15/2022 Weight:  230 lbs 08/29/2022 Weight: 230 lbs 11/21/2022 Office Weight: 237 lbs 02/18/2023 Weight: 230 lbs  04/03/2023 Office Weight: 230 lbs 06/25/2023 Weight: 234 lbs lbs  09/18/2023 Weight: 230 lbs   AT/AF Burden: 0% (taking Eliquis )                                                            Spoke with patient and heart failure questions reviewed.  Transmission results reviewed.  Pt asymptomatic for fluid accumulation.  Reports feeling well at this time and voices no complaints.     Since 09/16/2023 ICM Remote Transmission: Cor[employer]ue thoracic impedance suggesting normal fluid levels with the exception of possible fluid accumulation starting 10/27/2023 but trending back toward baseline.      Prescribed: No diuretic   Labs: 04/20/2023 Creatinine 1.02, BUN 25, Potassium 4.6, Sodium 137, GFR 58  04/19/2023 Creatinine 1.01, BUN 21, Potassium 4.4, Sodium 135, GFR 59  04/18/2023 Creatinine 1.12, BUN 20, Potassium 4.3, Sodium 137, GFR 52  04/17/2023 Creatinine 1.14, BUN 18, Potassium 4.7, Sodium 134, GFR 51 04/16/2023 Creatinine 0.82, BUN 21, Potassium 4.0, Sodium 140, GFR >60  04/15/2023 Creatinine 0.79, BUN 19, Potassium 3.1, Sodium 144, GFR >60  A complete set of results can be found in Results Review.   Recommendations:  No changes and encouraged to call if experiencing any fluid symptoms.   Next ICM clinic phone appointment due:  11/28/2023.   Next 91 day remote transmission due: 12/30/2023     EP/Cardiology Office Visits:  10/31/2023 with Orren Fabry, PA.  Advised to make appointment for in office device check.   Recall 09/30/2023 with Daphne Barrack, NP.    Recall 12/07/2023 with Dr Jeffrie.   Copy of ICM  check sent to Dr. Cindie.     Remote monitoring is medically necessary for Heart Failure Management.    Daily Thoracic Impedance ICM trend: 07/31/2023 through 10/29/2023.    12-14 Month Thoracic Impedance ICM trend:     Mitzie GORMAN Garner, RN 10/29/2023 11:57 AM

## 2023-10-29 NOTE — Telephone Encounter (Signed)
 Returned patient call and advised home monitor is showing it is disconnected.  She will check the machine and send remote transmission for review.

## 2023-10-30 LAB — URINE CULTURE: Culture: 80000 — AB

## 2023-10-30 NOTE — Progress Notes (Unsigned)
 Cardiology Office Note   Date:  10/31/2023  ID:  Jamie, Tran January 20, 1950, MRN [id number] PCP: Cristopher Suzen HERO, NP  [facility] Providers Cardiologist:  Oneil Parchment, MD Electrophysiologist:  OLE ONEIDA HOLTS, MD   History of Present Illness [name] is a 73 y.o. female with a past medical history of ICM status post ICD, VT/VF, LBBB, HFrEF for follow-up appointment.  Patient was seen by EP recently (March 2025) he and reported that she was healing up well post generator change.  She noted that she felt the device was larger and sticks out more.  She also has occasional pain at the device site.  She denied any chest pain, palpitation, dyspnea, PND, orthopnea, nausea, vomiting, dizziness, syncope, edema, weight gain, early satiety.  She overall was feeling well at that visit.  She saw Dr. Parchment about a year ago.  She was on amiodarone  for rate her antiarrhythmic therapy due to VT in the past.  Her weight have been stable ranging from 235 to 240 pounds and her NYHA class was between 2 and 3.  She was on goal-directed medical therapy with Entresto  and had been holding her Eliquis  48 hours prior to generator change.  Her creatinine was stable at 0.88, potassium 4.1, hemoglobin 14.9, TSH 1.6, and free T4 was 1.37.  Patient was overall feeling fine on her current medication regimen at the last exam.  No significant edema noted on exam.  Today, she tells me she has been struggling with LE edema and SOB.  Her blood pressure is elevated today at 160/90.  She tells me that she has been on spironolactone  in the past and is unsure why it was discontinued at some point.  Also looks like she is taking carvedilol  at 1 point but that was held due to bradycardia and hypotension.  She does take Farxiga  and has tolerated that well.  She is also on Entresto , Eliquis , amiodarone  100 mg daily, and Crestor  for blood pressure.  She has not had any chest pains recently.  She has not had any  skipped beats or fast heartbeats.  She does have an ICD and follows with EP.  She is overdue for her 64-month follow-up appointment.  She saw Daphne Barrack, NP back in March and her device was functioning well.  She does need an updated echocardiogram to check her heart pump function on GDMT.  We talked about potentially increasing her Entresto  if her blood pressure remains high.  Reports no chest pain, pressure, or tightness. No orthopnea, PND. Reports no palpitations.   Discussed the use of AI scribe software for clinical note transcription with the patient, who gave verbal consent to proceed.  ROS: Pertinent ROS in HPI  Studies Reviewed     Echocardiogram 07/31/2022 IMPRESSIONS     1. Left ventricular ejection fraction, by estimation, is 25 to 30%. The  left ventricle has severely decreased function. The left ventricle  demonstrates global hypokinesis with septal-lateral dyssynchrony  consistent with LBBB. The left ventricular  internal cavity size was mildly dilated. There is moderate concentric left  ventricular hypertrophy. Left ventricular diastolic parameters are  consistent with Grade I diastolic dysfunction (impaired relaxation).   2. Right ventricular systolic function is mildly reduced. The right  ventricular size is normal. Tricuspid regurgitation signal is inadequate  for assessing PA pressure.   3. Left atrial size was mildly dilated.   4. The mitral valve is normal in structure. No evidence of mitral valve  regurgitation. No  evidence of mitral stenosis. Moderate mitral annular  calcification.   5. The aortic valve is tricuspid. There is mild calcification of the  aortic valve. Aortic valve regurgitation is not visualized. No aortic  stenosis is present.   6. The inferior vena cava is normal in size with greater than 50%  respiratory variability, suggesting right atrial pressure of 3 mmHg.       Physical Exam VS:  BP (!) 160/90   Pulse 64   Ht 5' 3 (1.6 m)   Wt 230  lb (104.3 kg)   SpO2 98%   BMI 40.74 kg/m        Wt Readings from Last 3 Encounters:  10/31/23 230 lb (104.3 kg)  10/28/23 230 lb (104.3 kg)  10/22/23 210 lb (95.3 kg)    GEN: Well nourished, well developed in no acute distress NECK: No JVD; No carotid bruits CARDIAC: RRR, no murmurs, rubs, gallops RESPIRATORY:  Clear to auscultation without rales, wheezing or rhonchi  ABDOMEN: Soft, non-tender, non-distended EXTREMITIES:  No edema; No deformity   ASSESSMENT AND PLAN  Ischemic cardiomyopathy status post CABG with reduced ejection fraction -Last echocardiogram 01/2022 showed LVEF 25 to 30%, G1 DD, mildly reduced RV function, mildly enlarged RV -She does appear to be volume up today with 1-2+ pitting edema in her lower extremity -With her blood pressure being elevated and volume up, we have added HCTZ 12.5 mg daily -If her blood pressure continues to be elevated we will increase her Entresto  at her next visit -She will need a follow-up BMP in 2 weeks to assess her kidney function on new diuretic therapy -She is encouraged to continue her current dose of Entresto  and to continue Farxiga  10 mg daily -Has not been able to tolerate spironolactone  or carvedilol  in the past  Chronic systolic heart failure -Update echocardiogram to evaluate current heart pump function -Continue medication regimen, daily weights, and blood pressure monitoring  Ventricular tachycardia and ventricular fibrillation -She is status post ICD with a shock back in January 2024.  Amiodarone  was reduced to 100 mg daily during that hospitalization.  Asymptomatic from a rhythm standpoint.  Recommend follow-up with the EP ASAP (overdue for 27-month follow-up)  Chronic back pain -not discussed  Type 2 diabetes mellitus   -Hemoglobin A1c is 7.3, continue current medication regimen and follow-up with primary care for better glycemic control   History of DVT on chronic anticoagulation   Dispo: She can follow-up ASAP  with Dr. Cindie and his team in 6 months with Dr. Jeffrie or myself  Signed, Orren LOISE Fabry, PA-C

## 2023-10-31 ENCOUNTER — Ambulatory Visit: Attending: Internal Medicine | Admitting: Physician Assistant

## 2023-10-31 ENCOUNTER — Other Ambulatory Visit: Payer: Self-pay

## 2023-10-31 VITALS — BP 160/90 | HR 64 | Ht 63.0 in | Wt 230.0 lb

## 2023-10-31 DIAGNOSIS — I255 Ischemic cardiomyopathy: Secondary | ICD-10-CM

## 2023-10-31 DIAGNOSIS — I4901 Ventricular fibrillation: Secondary | ICD-10-CM

## 2023-10-31 DIAGNOSIS — Z95810 Presence of automatic (implantable) cardiac defibrillator: Secondary | ICD-10-CM

## 2023-10-31 DIAGNOSIS — I472 Ventricular tachycardia, unspecified: Secondary | ICD-10-CM | POA: Diagnosis not present

## 2023-10-31 DIAGNOSIS — E1151 Type 2 diabetes mellitus with diabetic peripheral angiopathy without gangrene: Secondary | ICD-10-CM

## 2023-10-31 DIAGNOSIS — I5042 Chronic combined systolic (congestive) and diastolic (congestive) heart failure: Secondary | ICD-10-CM | POA: Diagnosis not present

## 2023-10-31 DIAGNOSIS — Z79899 Other long term (current) drug therapy: Secondary | ICD-10-CM

## 2023-10-31 DIAGNOSIS — I739 Peripheral vascular disease, unspecified: Secondary | ICD-10-CM | POA: Diagnosis not present

## 2023-10-31 MED ORDER — HYDROCHLOROTHIAZIDE 12.5 MG PO CAPS: 12.5000 mg | ORAL_CAPSULE | Freq: Every day | ORAL | 3 refills | Status: DC

## 2023-10-31 NOTE — Patient Instructions (Signed)
 Medication Instructions:  START: Hydrochlorothiazide 12.5 mg daily (tablet will come in 25 mg. Will need to break/cut tablet in half and take 1/2 tablet daily) *If you need a refill on your cardiac medications before your next appointment, please call your pharmacy*  Lab Work: BMET in 2 weeks (11/14/23) If you have labs (blood work) drawn today and your tests are completely normal, you will receive your results only by: MyChart Message (if you have MyChart) OR A paper copy in the mail If you have any lab test that is abnormal or we need to change your treatment, we will call you to review the results.  Testing/Procedures: Your physician has requested that you have an echocardiogram. Echocardiography is a painless test that uses sound waves to create images of your heart. It provides your doctor with information about the size and shape of your heart and how well your heart's chambers and valves are working. This procedure takes approximately one hour. There are no restrictions for this procedure. Please do NOT wear cologne, perfume, aftershave, or lotions (deodorant is allowed). Please arrive 15 minutes prior to your appointment time.  Please note: We ask at that you not bring children with you during ultrasound (echo/ vascular) testing. Due to room size and safety concerns, children are not allowed in the ultrasound rooms during exams. Our front office staff cannot provide observation of children in our lobby area while testing is being conducted. An adult accompanying a patient to their appointment will only be allowed in the ultrasound room at the discretion of the ultrasound technician under special circumstances. We apologize for any inconvenience.   Follow-Up: At [facility], you and your health needs are our priority.  As part of our continuing mission to provide you with exceptional heart care, our providers are all part of one team.  This team includes your primary Cardiologist  (physician) and Advanced Practice Providers or APPs (Physician Assistants and Nurse Practitioners) who all work together to provide you with the care you need, when you need it.  Your next appointment:   6 month(s)  Provider:   Oneil Parchment, MD   Patient needs to see Dr Cindie ASAP or EP APP   We recommend signing up for the patient portal called MyChart.  Sign up information is provided on this After Visit Summary.  MyChart is used to connect with patients for Virtual Visits (Telemedicine).  Patients are able to view lab/test results, encounter notes, upcoming appointments, etc.  Non-urgent messages can be sent to your provider as well.   To learn more about what you can do with MyChart, go to [email or web address].

## 2023-11-04 ENCOUNTER — Ambulatory Visit: Payer: Self-pay

## 2023-11-04 NOTE — Telephone Encounter (Signed)
-----   Message from [place], [place] sent at 11/04/2023  7:53 AM EDT ----- Can you please check in with patient regarding UTI. Medicine that was previously sent should appropriately have treated UTI.  Thanks, Hoy ----- Message ----- From: [place], [facility] [place] Sent: 10/28/2023   1:01 PM EDT To: Hoy Masters, MD

## 2023-11-04 NOTE — Telephone Encounter (Signed)
 Per Dr.Newton, I reached out to [name] regarding UTI S&S from last week. She states she finished her antibiotic yesterday and feels much better. No questions or concerns at this time.   Dr.Newton aware

## 2023-11-05 ENCOUNTER — Encounter: Payer: Self-pay | Admitting: Podiatry

## 2023-11-05 ENCOUNTER — Ambulatory Visit: Admitting: Podiatry

## 2023-11-05 DIAGNOSIS — M79675 Pain in left toe(s): Secondary | ICD-10-CM | POA: Diagnosis not present

## 2023-11-05 DIAGNOSIS — B351 Tinea unguium: Secondary | ICD-10-CM | POA: Diagnosis not present

## 2023-11-05 DIAGNOSIS — E1151 Type 2 diabetes mellitus with diabetic peripheral angiopathy without gangrene: Secondary | ICD-10-CM

## 2023-11-05 DIAGNOSIS — M79674 Pain in right toe(s): Secondary | ICD-10-CM | POA: Diagnosis not present

## 2023-11-05 NOTE — Progress Notes (Unsigned)
 Electrophysiology Office Note:   Date:  11/06/2023  ID:  [name], DOB July 04, 1950, MRN [id number]  Primary Cardiologist: Oneil Parchment, MD Primary Heart Failure: None Electrophysiologist: OLE ONEIDA HOLTS, MD       History of Present Illness:   [name] is a 73 y.o. female with h/o  ICM s/p ICD, VT/VF, LBBB, HFrEF, May-Thurner syndrome seen today for routine electrophysiology followup.   Since last being seen in our clinic the patient reports doing well overall. She feels she has been doing well - has noted some swelling in her LE's. No associated shortness of breath.   She states she was to get a short term Rx for hydrochlorothiazide at her Cardiology visit but it was not sent.    She  denies chest pain, palpitations, dyspnea, PND, orthopnea, nausea, vomiting, dizziness, syncope, edema, weight gain, or early satiety.   Review of systems complete and found to be negative unless listed in HPI.    EP Information / Studies Reviewed:    EKG is ordered today. Personal review as below.  EKG Interpretation Date/Time:  Wednesday November 06 2023 07:57:14 EDT Ventricular Rate:  63 PR Interval:  174 QRS Duration:  172 QT Interval:  554 QTC Calculation: 566 R Axis:   -55  Text Interpretation: AV dual-paced rhythm Biventricular pacemaker detected Confirmed by Aniceto Jarvis ([id number]) on 11/06/2023 8:01:42 AM   ICD Interrogation-  reviewed in detail today,  See PACEART report.  Device History: Device History: Abbott Dual Chamber ICD (CRT device with LV port plugged) implanted 05/01/2006 for ICM / HFrEF. LV lead attempted but unsuccessful as no suitable veins for LV lead Generator Change: 09/25/12, 12/28/22 History of appropriate therapy: Yes 01/2019, VF 01/2022  History of AAD therapy: Yes; currently on amiodarone     Studies:  ECHO 07/2022 > LVEF 25-30%, LBBB, G1DD, LA mildly dilated Generator Change 12/28/22 > venogram of the LUE demonstrating patent L [place] and axillary veins  with a stenosis medial to the first rib, successful implantation of a LBA lead for resynchronization therapy (pre-procedure QRS 160 ms, post 138 ms)   Risk Assessment/Calculations:    CHA2DS2-VASc Score = 6   This indicates a 9.7% annual risk of stroke. The patient's score is based upon: CHF History: 1 HTN History: 1 Diabetes History: 1 Stroke History: 0 Vascular Disease History: 1 Age Score: 1 Gender Score: 1          Physical Exam:   VS:  BP (!) 160/82 (BP Location: Right Arm, Patient Position: Sitting, Cuff Size: Large)   Pulse 63   Ht 5' 3 (1.6 m)   Wt 230 lb (104.3 kg)   SpO2 94%   BMI 40.74 kg/m    Wt Readings from Last 3 Encounters:  11/06/23 230 lb (104.3 kg)  10/31/23 230 lb (104.3 kg)  10/28/23 230 lb (104.3 kg)     GEN: Well nourished, well developed in no acute distress NECK: No JVD; No carotid bruits CARDIAC: Regular rate and rhythm, no murmurs, rubs, gallops RESPIRATORY:  Clear to auscultation without rales, wheezing or rhonchi  ABDOMEN: Soft, non-tender, non-distended EXTREMITIES:  No edema; No deformity   ASSESSMENT AND PLAN:    Chronic Combined Systolic & Diastolic Dysfunction s/p Abbott CRT-D  VT / VF  -euvolemic by exam / device  -94% BiV pacing  -Stable on an appropriate medical regimen -Normal ICD function -See Pace Art report -No changes today -2 week Rx of hydrochlorothiazide to address volume status / to cover her  until she can get her mail order Rx   High Risk Medication Monitoring: Amiodarone  -amiodarone  100 mg daily  -update amiodarone  labs > LFT's, TSH / free T4. Plan to add on to her 11/13 labs.    Disposition:   Follow up with EP APP in 12 months   Signed, Daphne Barrack, NP-C, AGACNP-BC [facility] - Electrophysiology  11/06/2023, 8:33 AM

## 2023-11-06 ENCOUNTER — Encounter: Payer: Self-pay | Admitting: Pulmonary Disease

## 2023-11-06 ENCOUNTER — Ambulatory Visit: Attending: Pulmonary Disease | Admitting: Pulmonary Disease

## 2023-11-06 ENCOUNTER — Other Ambulatory Visit (HOSPITAL_COMMUNITY): Payer: Self-pay

## 2023-11-06 VITALS — BP 160/82 | HR 63 | Ht 63.0 in | Wt 230.0 lb

## 2023-11-06 DIAGNOSIS — Z95810 Presence of automatic (implantable) cardiac defibrillator: Secondary | ICD-10-CM | POA: Diagnosis not present

## 2023-11-06 DIAGNOSIS — I5042 Chronic combined systolic (congestive) and diastolic (congestive) heart failure: Secondary | ICD-10-CM

## 2023-11-06 DIAGNOSIS — Z79899 Other long term (current) drug therapy: Secondary | ICD-10-CM | POA: Diagnosis not present

## 2023-11-06 DIAGNOSIS — I472 Ventricular tachycardia, unspecified: Secondary | ICD-10-CM | POA: Diagnosis not present

## 2023-11-06 MED ORDER — HYDROCHLOROTHIAZIDE 12.5 MG PO CAPS
12.5000 mg | ORAL_CAPSULE | Freq: Every day | ORAL | 0 refills | Status: AC
  Filled 2023-11-06 (×2): qty 14, 14d supply, fill #0

## 2023-11-06 NOTE — Patient Instructions (Signed)
 Medication Instructions:  Your physician recommends that you continue on your current medications as directed. Please refer to the Current Medication list given to you today.  *If you need a refill on your cardiac medications before your next appointment, please call your pharmacy*  Lab Work: None  If you have labs (blood work) drawn today and your tests are completely normal, you will receive your results only by: MyChart Message (if you have MyChart) OR A paper copy in the mail If you have any lab test that is abnormal or we need to change your treatment, we will call you to review the results.  Follow-Up: At [facility], you and your health needs are our priority.  As part of our continuing mission to provide you with exceptional heart care, our providers are all part of one team.  This team includes your primary Cardiologist (physician) and Advanced Practice Providers or APPs (Physician Assistants and Nurse Practitioners) who all work together to provide you with the care you need, when you need it.  Your next appointment:   1 year(s)  Provider:   You may see OLE ONEIDA HOLTS, MD or one of the following Advanced Practice Providers on your designated Care Team:   Charlies Arthur, [place] Ozell Jodie Passey, PA-C Suzann Riddle, NP Daphne Barrack, NP Artist Pouch, PA-C

## 2023-11-07 ENCOUNTER — Ambulatory Visit: Payer: Self-pay | Admitting: Cardiology

## 2023-11-07 LAB — CUP PACEART INCLINIC DEVICE CHECK
Date Time Interrogation Session: 20251029121531
Implantable Lead Connection Status: 753985
Implantable Lead Connection Status: 753985
Implantable Lead Connection Status: 753985
Implantable Lead Implant Date: 20080423
Implantable Lead Implant Date: 20140918
Implantable Lead Implant Date: 20241220
Implantable Lead Location: 753858
Implantable Lead Location: 753859
Implantable Lead Location: 753860
Implantable Lead Model: 5076
Implantable Pulse Generator Implant Date: 20241220
Pulse Gen Serial Number: 5567048

## 2023-11-10 NOTE — Progress Notes (Signed)
  Subjective:  Patient ID: [name], female    DOB: 07/25/1950,  MRN: [id number]  [name] presents to clinic today for at risk foot care. Pt has h/o NIDDM with PAD and painful mycotic toenails of both feet that are difficult to trim. Pain interferes with daily activities and wearing enclosed shoe gear comfortably.  Chief Complaint  Patient presents with   Diabetes    Toenails Saw Dr. Sam - 05/14/2023; A1c - 7.3   New problem(s): None.   PCP is Cristopher Suzen HERO, NP.  Allergies  Allergen Reactions   Metformin And Related Itching, Swelling and Other (See Comments)    Leg pain & swelling in legs   Ozempic  (0.25 Or 0.5 Mg-Dose) [Semaglutide (0.25 Or 0.5mg -Dos)] Other (See Comments)    Pt with hx of pancreatitis    Atorvastatin  Calcium  Itching and Rash   Levofloxacin  Itching    Review of Systems: Negative except as noted in the HPI.  Objective: No changes noted in today's physical examination. There were no vitals filed for this visit. [name] is a pleasant 73 y.o. female in NAD. AAO x 3.  Vascular Examination: Capillary fill time to digits <3 seconds left foot. Capillary refill time to digits <5 seconds right foot. Diminished DP/PT pulses b/l LE. Pedal hair absent. No pain with calf compression b/l. Right foot cool to touch. Left foot warm. No ischemia/gangrene. No symptoms of rest pain per patient. Trace edema noted BLE. No ischemia or gangrene noted b/l LE. No cyanosis or clubbing noted b/l LE.  Neurological Examination: Sensation grossly intact b/l with 10 gram monofilament. Vibratory sensation intact b/l.  Dermatological Examination: Pedal skin with normal turgor, texture and tone b/l. No open wounds nor interdigital macerations noted. Toenails 1-5 b/l thick, discolored, elongated with subungual debris and pain on dorsal palpation. No hyperkeratotic lesions noted b/l.   Musculoskeletal Examination: Muscle strength 5/5 to b/l LE.  No pain,  crepitus noted b/l. No gross pedal deformities. Patient ambulates independently without assistive aids.   Radiographs: None  Assessment/Plan: 1. Pain due to onychomycosis of toenails of both feet   2. Type II diabetes mellitus with peripheral circulatory disorder [facility])   Patient was evaluated and treated. All patient's and/or POA's questions/concerns addressed on today's visit. Mycotic toenails 1-5 b/l debrided in length and girth without incident.  Continue daily foot inspections and monitor blood glucose per PCP/Endocrinologist's recommendations.Continue soft, supportive shoe gear daily. Report any pedal injuries to medical professional. Call office if there are any quesitons/concerns. -Patient/POA to call should there be question/concern in the interim.   Return in about 3 months (around 02/05/2024).  [name], DPM      [facility] LOCATION: [address], [place] [address]                   Office [phone]   [facility] LOCATION: [address] [place], [place] [address] Office [phone]

## 2023-11-14 ENCOUNTER — Ambulatory Visit: Admitting: Internal Medicine

## 2023-11-18 ENCOUNTER — Other Ambulatory Visit: Payer: Self-pay | Admitting: *Deleted

## 2023-11-18 DIAGNOSIS — Z1231 Encounter for screening mammogram for malignant neoplasm of breast: Secondary | ICD-10-CM

## 2023-11-21 ENCOUNTER — Ambulatory Visit (HOSPITAL_COMMUNITY)
Admission: RE | Admit: 2023-11-21 | Discharge: 2023-11-21 | Disposition: A | Discharge status: Home / Self Care | Source: Ambulatory Visit | Attending: Cardiology | Admitting: Cardiology

## 2023-11-21 DIAGNOSIS — Z95810 Presence of automatic (implantable) cardiac defibrillator: Secondary | ICD-10-CM

## 2023-11-21 DIAGNOSIS — I255 Ischemic cardiomyopathy: Secondary | ICD-10-CM | POA: Diagnosis present

## 2023-11-21 DIAGNOSIS — Z79899 Other long term (current) drug therapy: Secondary | ICD-10-CM | POA: Diagnosis present

## 2023-11-21 DIAGNOSIS — I472 Ventricular tachycardia, unspecified: Secondary | ICD-10-CM | POA: Diagnosis not present

## 2023-11-21 DIAGNOSIS — I5042 Chronic combined systolic (congestive) and diastolic (congestive) heart failure: Secondary | ICD-10-CM | POA: Diagnosis not present

## 2023-11-21 DIAGNOSIS — I4901 Ventricular fibrillation: Secondary | ICD-10-CM | POA: Diagnosis present

## 2023-11-21 DIAGNOSIS — E1151 Type 2 diabetes mellitus with diabetic peripheral angiopathy without gangrene: Secondary | ICD-10-CM | POA: Diagnosis present

## 2023-11-21 DIAGNOSIS — I739 Peripheral vascular disease, unspecified: Secondary | ICD-10-CM

## 2023-11-21 LAB — ECHOCARDIOGRAM COMPLETE
Area-P 1/2: 2.63 cm2
S' Lateral: 4.1 cm

## 2023-11-21 MED ORDER — PERFLUTREN LIPID MICROSPHERE
1.0000 mL | INTRAVENOUS | Status: AC | PRN
  Administered 2023-11-21: 1 mL via INTRAVENOUS

## 2023-11-22 ENCOUNTER — Encounter: Payer: Self-pay | Admitting: Internal Medicine

## 2023-11-22 ENCOUNTER — Ambulatory Visit (INDEPENDENT_AMBULATORY_CARE_PROVIDER_SITE_OTHER): Admitting: Internal Medicine

## 2023-11-22 VITALS — BP 148/82 | Ht 63.0 in | Wt 230.0 lb

## 2023-11-22 DIAGNOSIS — E1165 Type 2 diabetes mellitus with hyperglycemia: Secondary | ICD-10-CM | POA: Diagnosis not present

## 2023-11-22 DIAGNOSIS — E1159 Type 2 diabetes mellitus with other circulatory complications: Secondary | ICD-10-CM

## 2023-11-22 DIAGNOSIS — E1142 Type 2 diabetes mellitus with diabetic polyneuropathy: Secondary | ICD-10-CM

## 2023-11-22 DIAGNOSIS — Z794 Long term (current) use of insulin: Secondary | ICD-10-CM

## 2023-11-22 LAB — HEPATIC FUNCTION PANEL
ALT: 33 IU/L — ABNORMAL HIGH (ref 0–32)
AST: 30 IU/L (ref 0–40)
Albumin: 4 g/dL (ref 3.8–4.8)
Alkaline Phosphatase: 100 IU/L (ref 49–135)
Bilirubin Total: 0.6 mg/dL (ref 0.0–1.2)
Bilirubin, Direct: 0.29 mg/dL (ref 0.00–0.40)
Total Protein: 7 g/dL (ref 6.0–8.5)

## 2023-11-22 LAB — TSH: TSH: 1.12 u[IU]/mL (ref 0.450–4.500)

## 2023-11-22 LAB — POCT GLYCOSYLATED HEMOGLOBIN (HGB A1C): Hemoglobin A1C: 7.1 % — AB (ref 4.0–5.6)

## 2023-11-22 LAB — T4, FREE: Free T4: 1.36 ng/dL (ref 0.82–1.77)

## 2023-11-22 MED ORDER — DAPAGLIFLOZIN PROPANEDIOL 10 MG PO TABS: 10.0000 mg | ORAL_TABLET | Freq: Every day | ORAL | 3 refills | Status: AC

## 2023-11-22 MED ORDER — TRESIBA FLEXTOUCH 100 UNIT/ML ~~LOC~~ SOPN: 30.0000 [IU] | PEN_INJECTOR | Freq: Every day | SUBCUTANEOUS | 3 refills | Status: DC

## 2023-11-22 NOTE — Progress Notes (Signed)
 Name: [name]  Age/ Sex: 73 y.o., female   MRN/ DOB: [id number], [birth year]     PCP: Jamie Suzen HERO, NP   Reason for Endocrinology Evaluation: Type 2 Diabetes Mellitus  Initial Endocrine Consultative Visit: 04/20/2019    PATIENT IDENTIFIER: [name] is a 73 y.o. female with a past medical history of DM, HTN, Dyslipidemia , CAD and hx of pancreatitis.. The patient has followed with Endocrinology clinic since 04/20/2019 for consultative assistance with management of her diabetes.     DIABETIC HISTORY:  [name] was diagnosed with T2DM many years ago, she is intolerant to Metformin, has been on Glipizide without reported intolerance. Her hemoglobin A1c has ranged from 7.8% in 2017, peaking at 9.5% in 2014.  On her initial visit to our clinic she had an A1c of 7.7%  , she was on MDI regimen and Ozempic . . We switched levemir  to tresiba  and continued Ozempic  and Novolog .    farxiga  started 11/2019 Ozempic  stopped 12/2020 due to pancreatitis on CT imaging.     SUBJECTIVE:   During the last visit (05/14/2023): A1c 7.3%    Today (11/22/2023): [name] is here for a follow up on diabetes.  She used to check her glucose 3 CGM technology, but since she has been at the rehab center they have been using fingersticks and they check glucose 4 times a day.    She is s/p ORIF of the right ankle 04/2023 She continues to follow-up with cardiology for CHF and CAD  She also follows with vascular surgery due to mixed venous insufficiency, she had decrease in ABI with calcified vessels She also continues to follow-up with GYN for adnexal mass which has been stable   No nausea or vomiting  No constipation    HOME DIABETES REGIMEN:   Continue Tresiba  33 units ONCE  Daily  -Continue Farxiga  10 mg, 1 tablet with Breakfast  - CF: Humalog : TIDQAC and bedtime 70-200 = 0 201- 250= 2 251-300 = 4  301 -350 = 6  351- 400 = 8 units       Statin: Yes ACE-I/ARB: No     CONTINUOUS GLUCOSE MONITORING RECORD INTERPRETATION    Dates of Recording: 11/1-11/14/2025  Sensor description:dexcom  Results statistics:   CGM use % of time 96  Average and SD 143/39  Time in range      83  %  % Time Above 180 16  % Time above 250 1  % Time Below target 0   Glycemic patterns summary: BGs are optimal overnight and fluctuate during the day  Hyperglycemic episodes postprandial  Hypoglycemic episodes occurred overnight  Overnight periods: Trends down     DIABETIC COMPLICATIONS: Microvascular complications:  Neuropathy Denies: CKD, retinopathy  Last Eye Exam: Completed 2024  Macrovascular complications:  CAD, CHF Denies: CVA, PVD   HISTORY:  Past Medical History:  Past Medical History:  Diagnosis Date   Abnormal LFTs    AICD (automatic cardioverter/defibrillator) present    Angina decubitus 11/06/2012   Angina pectoris    Anxiety    Arthritis    Back pain    Bundle branch block 05/2016   CAD (coronary artery disease) 06/18/2010   CHF (congestive heart failure) (HCC)    Chronic combined systolic and diastolic CHF, NYHA class 3 (HCC)    Chronic combined systolic and diastolic heart failure (HCC)    Chronic systolic dysfunction of left ventricle 09/24/2012   Coronary artery disease    s/p CABG  2007   Depression    Dermal mycosis    Diabetes mellitus    type II   DKA (diabetic ketoacidoses) 01/12/2019   DVT (deep venous thrombosis) (HCC) 09/2015   bilateral   Edema, lower extremity    Glaucoma    Hyperlipidemia    Hypertension    Hypoglycemia 09/09/2012   Insomnia    Insulin  dependent diabetes mellitus (HCC) 04/23/2012   Ischemic cardiomyopathy    EF 30-35%, s/p ICD 4/08 by Dr Malva   Joint pain    LBBB (left bundle branch block) 09/24/2012   Morbid obesity (HCC) 11/06/2012   Obesity    Oxygen dependent 2 L   Pain in left knee    Peripheral neuropathy    Peripheral vascular disease    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   LIMA to  D2, SVG to ramus intermediate, sequential SVG to OM1-OM2, SVG to RCA with EVH via both legs    Sleep apnea    uses O2 at night   Tinea    Type 2 diabetes mellitus with hyperglycemia, with long-term current use of insulin  (HCC) 04/20/2019   Uncontrolled diabetes mellitus 02/2019   Ventricular tachycardia (HCC) 01/16/2015   sustained VT terminated with ATP, CL 340 msec   Vitamin B 12 deficiency 11/05/2018   Vitamin D  deficiency 11/05/2018   Past Surgical History:  Past Surgical History:  Procedure Laterality Date   BI-VENTRICULAR IMPLANTABLE CARDIOVERTER DEFIBRILLATOR UPGRADE N/A 09/25/2012   Procedure: BI-VENTRICULAR IMPLANTABLE CARDIOVERTER DEFIBRILLATOR UPGRADE;  Surgeon: Lynwood JONETTA Rakers, MD;  Location: [facility] CATH LAB;  Service: Cardiovascular;  Laterality: N/A;   BIV ICD GENERATOR CHANGEOUT N/A 12/11/2022   Procedure: BIV ICD GENERATOR CHANGEOUT;  Surgeon: Cindie Ole DASEN, MD;  Location: [facility] INVASIVE CV LAB;  Service: Cardiovascular;  Laterality: N/A;   BIV ICD GENERATOR CHANGEOUT N/A 12/28/2022   Procedure: BIV ICD GENERATOR CHANGEOUT;  Surgeon: Cindie Ole DASEN, MD;  Location: [facility] INVASIVE CV LAB;  Service: Cardiovascular;  Laterality: N/A;   BIV UPGRADE Left 12/28/2022   Procedure: BIV UPGRADE;  Surgeon: Cindie Ole DASEN, MD;  Location: [facility] INVASIVE CV LAB;  Service: Cardiovascular;  Laterality: Left;   BREAST BIOPSY Right 04/05/2014   CARDIAC CATHETERIZATION     CARDIAC DEFIBRILLATOR PLACEMENT  4/08   by Dr Malva (MDT)   COLONOSCOPY WITH PROPOFOL  N/A 10/12/2016   Procedure: COLONOSCOPY WITH PROPOFOL ;  Surgeon: Legrand Victory LITTIE DOUGLAS, MD;  Location: [facility] ENDOSCOPY;  Service: Gastroenterology;  Laterality: N/A;   CORONARY ARTERY BYPASS GRAFT  10/05/2005   by Dr Lucas   IMPLANTABLE CARDIOVERTER DEFIBRILLATOR IMPLANT  09/25/12   attempt of upgrade to CRT-D unsuccessful due to CS anatomy, SJM Unify Asaura device placed with LV port capped by Dr Rakers   IR GENERIC HISTORICAL  10/03/2015    IR US  GUIDE VASC ACCESS LEFT 10/03/2015 Wilkie Lent, MD [facility]   IR GENERIC HISTORICAL  10/03/2015   IR VENO/EXT/UNI LEFT 10/03/2015 Wilkie Lent, MD [facility]   IR GENERIC HISTORICAL  10/03/2015   IR VENOCAVAGRAM IVC 10/03/2015 Wilkie Lent, MD [facility]   IR GENERIC HISTORICAL  10/03/2015   IR INFUSION THROMBOL VENOUS INITIAL (MS) 10/03/2015 Wilkie Lent, MD [facility]   IR GENERIC HISTORICAL  10/03/2015   IR US  GUIDE VASC ACCESS LEFT 10/03/2015 Wilkie Lent, MD [facility]   IR GENERIC HISTORICAL  10/04/2015   IR THROMBECT VENO [facility] MOD SED 10/04/2015 Norleen Roulette, MD [facility]   IR GENERIC HISTORICAL  10/04/2015  IR TRANSCATH PLC STENT 1ST ART NOT LE CV CAR VERT CAR 10/04/2015 Norleen Roulette, MD [facility]   IR GENERIC HISTORICAL  10/04/2015   IR THROMB F/U EVAL ART/VEN FINAL DAY (MS) 10/04/2015 Norleen Roulette, MD [facility]   IR GENERIC HISTORICAL  11/02/2015   IR RADIOLOGIST EVAL & MGMT 11/02/2015 Norleen Roulette, MD [facility]   IR RADIOLOGIST EVAL & MGMT  04/26/2016   LEFT HEART CATH AND CORS/GRAFTS ANGIOGRAPHY N/A 01/19/2019   Procedure: LEFT HEART CATH AND CORS/GRAFTS ANGIOGRAPHY;  Surgeon: Claudene Victory ORN, MD;  Location: [facility] INVASIVE CV LAB;  Service: Cardiovascular;  Laterality: N/A;   LEFT HEART CATH AND CORS/GRAFTS ANGIOGRAPHY N/A 01/15/2022   Procedure: LEFT HEART CATH AND CORS/GRAFTS ANGIOGRAPHY;  Surgeon: Court Dorn PARAS, MD;  Location: [facility] INVASIVE CV LAB;  Service: Cardiovascular;  Laterality: N/A;   ORIF ANKLE FRACTURE Right 04/17/2023   Procedure: OPEN REDUCTION INTERNAL FIXATION (ORIF) ANKLE FRACTURE;  Surgeon: Barton Drape, MD;  Location: [facility];  Service: Orthopedics;  Laterality: Right;   Social History:  reports that she has never smoked. She has never used smokeless tobacco. She reports that she does not drink alcohol and does not use drugs. Family History:  Family History  Problem Relation Age of Onset   Diabetes Mother 85        died - HTN   Stroke Mother    High blood pressure Mother    Sudden death Mother    Obesity Mother    Other Other        No early family hx of CAD   Breast cancer Neg Hx    Ovarian cancer Neg Hx    Colon cancer Neg Hx    Endometrial cancer Neg Hx      HOME MEDICATIONS: Allergies as of 11/22/2023       Reactions   Metformin And Related Itching, Swelling, Other (See Comments)   Leg pain & swelling in legs   Ozempic  (0.25 Or 0.5 Mg-dose) [semaglutide (0.25 Or 0.5mg -dos)] Other (See Comments)   Pt with hx of pancreatitis    Atorvastatin  Calcium  Itching, Rash   Levofloxacin  Itching        Medication List        Accurate as of November 22, 2023 11:37 AM. If you have any questions, ask your nurse or doctor.          Accu-Chek Guide Me w/Device Kit Use to check blood sugars   acetaminophen  500 MG tablet Commonly known as: TYLENOL  Take 500 mg by mouth 3 (three) times daily as needed for mild pain (pain score 1-3) or moderate pain (pain score 4-6).   amiodarone  100 MG tablet Commonly known as: PACERONE  Take 100 mg by mouth daily.   calcitonin (salmon) 200 UNIT/ACT nasal spray Commonly known as: MIACALCIN /FORTICAL Place 1 spray into alternate nostrils daily.   cyanocobalamin 1000 MCG/ML injection Commonly known as: VITAMIN B12 Inject 1,000 mcg into the muscle every 30 (thirty) days.   Dexcom G7 Sensor Misc 1 Device by Does not apply route as directed.   dextrose  40 % Gel Commonly known as: GLUTOSE Take 15 g by mouth as needed for low blood sugar.   Eliquis  5 MG Tabs tablet Generic drug: apixaban  TAKE 1 TABLET TWICE DAILY   estradiol 0.1 MG/GM vaginal cream Commonly known as: ESTRACE Place vaginally.   ezetimibe  10 MG tablet Commonly known as: ZETIA  Take 1 tablet (10 mg total) by mouth daily.   Farxiga  10 MG Tabs tablet Generic  drug: dapagliflozin  propanediol TAKE 1 TABLET EVERY DAY   ferrous sulfate  325 (65 FE) MG EC tablet Take 325 mg by mouth  daily with breakfast.   gabapentin  300 MG capsule Commonly known as: NEURONTIN  Take 300 mg by mouth 3 (three) times daily.   hydrochlorothiazide 12.5 MG capsule Commonly known as: MICROZIDE Take 1 capsule (12.5 mg total) by mouth daily.   Insulin  Pen Needle 31G X 5 MM Misc 1 Device by Does not apply route in the morning, at noon, in the evening, and at bedtime.   ketoconazole 2 % cream Commonly known as: NIZORAL Apply 1 Application topically daily as needed for irritation.   latanoprost  0.005 % ophthalmic solution Commonly known as: XALATAN  Place 1 drop into both eyes at bedtime.   lidocaine  5 % Commonly known as: Lidoderm  Place 1 patch onto the skin daily. Remove & Discard patch within 12 hours or as directed by MD   nitroGLYCERIN  0.4 MG SL tablet Commonly known as: NITROSTAT  DISSOLVE 1 TABLET UNDER THE TONGUE EVERY 5 MINUTES AS NEEDED FOR CHEST PAIN   NovoLOG  FlexPen 100 UNIT/ML FlexPen Generic drug: insulin  aspart INJECT PER SLIDING SCALE AS DIRECTED , MAX 30 UNITS PER DAY. DISCARD PEN 28 DAYS AFTER OPENING   OXYGEN Inhale 2 L into the lungs continuous.   rosuvastatin  20 MG tablet Commonly known as: CRESTOR  Take 1 tablet (20 mg total) by mouth daily at 6 PM.   sacubitril -valsartan  24-26 MG Commonly known as: ENTRESTO  TAKE 1 TABLET TWICE DAILY   Tradjenta 5 MG Tabs tablet Generic drug: linagliptin Take 5 mg by mouth daily.   Tresiba  FlexTouch 100 UNIT/ML FlexTouch Pen Generic drug: insulin  degludec INJECT 30 UNITS INTO THE SKIN DAILY.   True Metrix Blood Glucose Test test strip Generic drug: glucose blood TEST THREE TIMES DAILY   Accu-Chek Guide test strip Generic drug: glucose blood Check 3 times daily   Accu-Chek Guide Test test strip Generic drug: glucose blood TEST BLOOD SUGAR THREE TIMES DAILY   Vitamin D  (Ergocalciferol ) 1.25 MG (50000 UNIT) Caps capsule Commonly known as: DRISDOL  Take 1 capsule (50,000 Units total) by mouth every 7 (seven)  days.         OBJECTIVE:   Vital Signs: Ht 5' 3 (1.6 m)   BMI 40.74 kg/m    Wt Readings from Last 3 Encounters:  11/06/23 230 lb (104.3 kg)  10/31/23 230 lb (104.3 kg)  10/28/23 230 lb (104.3 kg)     Exam: General: Pt appears well and is in NAD  Lungs: Clear with good BS bilat  Heart: RRR  Extremities: Trace edema B/L  Neuro: MS is good with appropriate affect, pt is alert and Ox3     DM Foot Exam: 11/22/2023  The skin of the feet is intact without sores or ulcerations. The pedal pulses are undetectable The sensation is absent to a screening 5.07, 10 gram monofilament bilaterally at the toes      DATA REVIEWED:  Lab Results  Component Value Date   HGBA1C 7.3 (H) 04/16/2023   HGBA1C 7.0 (A) 11/08/2022   HGBA1C 7.4 (A) 05/07/2022    Latest Reference Range & Units 04/20/23 03:15  Sodium 135 - 145 mmol/L 137  Potassium 3.5 - 5.1 mmol/L 4.6  Chloride 98 - 111 mmol/L 103  CO2 22 - 32 mmol/L 27  Glucose 70 - 99 mg/dL 848 (H)  BUN 8 - 23 mg/dL 25 (H)  Creatinine 9.55 - 1.00 mg/dL 8.97 (H)  Calcium  8.9 - 10.3  mg/dL 9.3  Anion gap 5 - 15  7  Magnesium  1.7 - 2.4 mg/dL 2.0  Alkaline Phosphatase 38 - 126 U/L 53  Albumin 3.5 - 5.0 g/dL 2.9 (L)  AST 15 - 41 U/L 23  ALT 0 - 44 U/L 22  Total Protein 6.5 - 8.1 g/dL 6.5  Total Bilirubin 0.0 - 1.2 mg/dL 1.0  GFR, Estimated >39 mL/min 58 (L)    Old records , labs and images have been reviewed.    ASSESSMENT / PLAN / RECOMMENDATIONS:   1) Type 2 Diabetes Mellitus, optimally controlled, With Neuropathic and  macrovascular complications - Most recent A1c of 7.1  %. Goal A1c < 7.0 %.    -A1 is trending down -She has been noted with hypoglycemia overnight, I will decrease her basal insulin  as below -Will continue to use Humalog per correction scale - Not a candidate for GLP1 agonist, DPP-4 inhibitors and mounjaro due to pancreatitis.  - She is past due for MA/CR ratio but unable to provide a urine sample as she is  in a wheelchair  MEDICATIONS:  - Decrease Tresiba  30 units ONCE  Daily  -Continue Farxiga  10 mg, 1 tablet with Breakfast  - CF: Humalog : TIDQAC and bedtime 70-200 = 0 201- 250= 2 251-300 = 4  301 -350 = 6  351- 400 = 8 units    EDUCATION / INSTRUCTIONS: BG monitoring instructions: Patient is instructed to check her blood sugars 3 times a day, before meals Call [facility] if: BG persistently < 70 I reviewed the Rule of 15 for the treatment of hypoglycemia in detail with the patient. Literature supplied.   2) Diabetic complications:  Eye: Does not have known diabetic retinopathy.  Neuro/ Feet: Does  have known diabetic peripheral neuropathy based on foot exam from 07/16/2019 Renal: Patient does not have known baseline CKD. She is not on an ACEI/ARB at present.    F/U in 6 months    Signed electronically by: Stefano Redgie Butts, MD  [facility] Endocrinology  [facility] [address] [place], [place] [address] Phone: [phone] FAX: [phone]   CC: Jamie Suzen HERO, NP [address] [place] [address] Phone: [phone]  Fax: [phone]  Return to Endocrinology clinic as below: Future Appointments  Date Time Provider Department Center  11/22/2023 12:10 PM Zedrick Springsteen, Donell Redgie, MD [facility] None  11/28/2023  7:05 AM [facility] HVT DEVICE REMOTES [facility]  12/12/2023  2:10 PM [facility] MM 2 [facility]  12/30/2023  7:15 AM [facility] HVT DEVICE REMOTES [facility] H&V  02/18/2024  2:45 PM Gaynel Delon CROME, DPM [facility]  03/30/2024  7:15 AM [facility] HVT DEVICE REMOTES [facility] H&V  06/29/2024  7:15 AM [facility] HVT DEVICE REMOTES [facility] H&[facility]  09/28/2024  7:15 AM [facility] HVT DEVICE REMOTES [facility]  12/28/2024  7:15 AM [facility] HVT DEVICE REMOTES [facility] H&V

## 2023-11-22 NOTE — Patient Instructions (Signed)
-   Decrease Tresiba  30 units ONCE  Daily -Continue Farxiga  10 mg, 1 tablet with Breakfast  -Novolog  correctional insulin : Use the scale below to help guide you BEFORE you eat   Blood sugar before meal Number of units to inject  Less than 175 0 unit  176 -  220 1 units  221-  265 2 units  266 - 310 3 units  311 - 355 4 units  356 - 400 5 units     HOW TO TREAT LOW BLOOD SUGARS (Blood sugar LESS THAN 70 MG/DL) Please follow the RULE OF 15 for the treatment of hypoglycemia treatment (when your (blood sugars are less than 70 mg/dL)   STEP 1: Take 15 grams of carbohydrates when your blood sugar is low, which includes:  3-4 GLUCOSE TABS  OR 3-4 OZ OF JUICE OR REGULAR SODA OR ONE TUBE OF GLUCOSE GEL    STEP 2: RECHECK blood sugar in 15 MINUTES STEP 3: If your blood sugar is still low at the 15 minute recheck --> then, go back to STEP 1 and treat AGAIN with another 15 grams of carbohydrates.

## 2023-11-25 ENCOUNTER — Ambulatory Visit: Payer: Self-pay | Admitting: Cardiology

## 2023-11-28 ENCOUNTER — Ambulatory Visit: Attending: Cardiology

## 2023-11-28 ENCOUNTER — Telehealth: Payer: Self-pay

## 2023-11-28 ENCOUNTER — Ambulatory Visit

## 2023-11-28 DIAGNOSIS — Z95810 Presence of automatic (implantable) cardiac defibrillator: Secondary | ICD-10-CM

## 2023-11-28 DIAGNOSIS — I5042 Chronic combined systolic (congestive) and diastolic (congestive) heart failure: Secondary | ICD-10-CM

## 2023-11-28 NOTE — Telephone Encounter (Addendum)
 Returned patient call.  She attempted to send manual remote transmission but monitor is not working.  Provided [employer] support number and she will call for assistance.   ICM Remote transmission rescheduled for 12/31/2023

## 2023-11-28 NOTE — Telephone Encounter (Signed)
 ICM Call to patient. Advised home monitor is showing as disconnected.  She stated it looks to be working on her end, the lights are on and green.   She will send remote transmission and will provide her with [employer] tech support number when I call her back with the results.

## 2023-11-28 NOTE — Progress Notes (Signed)
 EPIC Encounter for ICM Monitoring  Patient Name: [name] is a 73 y.o. female Date: 11/28/2023 Primary Care Physican: Cristopher Suzen HERO, NP Primary Cardiologist: Skains/Weaver PA  Electrophysiologist: Camnitz BiV Pacing: >99% 04/16/2022 Weight: 230 lbs 05/15/2022 Weight:  230 lbs 08/29/2022 Weight: 230 lbs 11/21/2022 Office Weight: 237 lbs 02/18/2023 Weight: 230 lbs  04/03/2023 Office Weight: 230 lbs 06/25/2023 Weight: 234 lbs lbs  09/18/2023 Weight: 230 lbs   AT/AF Burden: <1% (taking Eliquis )                                                            Spoke with patient and heart failure questions reviewed.  Transmission results reviewed.  Pt asymptomatic for fluid accumulation.  Reports feeling well at this time and voices no complaints.     Since 10/28/2023 ICM Remote Transmission: [employer] thoracic impedance suggesting normal fluid levels with the exception of possible fluid accumulation 11/18/2023-11/23/2023.      Prescribed: No diuretic   Labs: 04/20/2023 Creatinine 1.02, BUN 25, Potassium 4.6, Sodium 137, GFR 58  04/19/2023 Creatinine 1.01, BUN 21, Potassium 4.4, Sodium 135, GFR 59  04/18/2023 Creatinine 1.12, BUN 20, Potassium 4.3, Sodium 137, GFR 52  04/17/2023 Creatinine 1.14, BUN 18, Potassium 4.7, Sodium 134, GFR 51 04/16/2023 Creatinine 0.82, BUN 21, Potassium 4.0, Sodium 140, GFR >60  04/15/2023 Creatinine 0.79, BUN 19, Potassium 3.1, Sodium 144, GFR >60  A complete set of results can be found in Results Review.   Recommendations:  No changes and encouraged to call if experiencing any fluid symptoms.   Next ICM clinic phone appointment due:  01/07/2024.   Next 91 day remote transmission due: 12/30/2023     EP/Cardiology Office Visits:    Recall 10/24/20256 with EP APP    Recall 04/28/2024 with Dr Jeffrie.   Copy of ICM check sent to Dr. Inocencio.     Remote monitoring is medically necessary for Heart Failure Management.    Daily Thoracic Impedance ICM trend:  08/30/2023 through 11/28/2023.    12-14 Month Thoracic Impedance ICM trend:     Mitzie GORMAN Garner, RN 11/28/2023 11:42 AM

## 2023-12-09 ENCOUNTER — Other Ambulatory Visit: Payer: Self-pay | Admitting: Internal Medicine

## 2023-12-12 ENCOUNTER — Ambulatory Visit
Admission: RE | Admit: 2023-12-12 | Discharge: 2023-12-12 | Disposition: A | Source: Ambulatory Visit | Attending: *Deleted | Admitting: *Deleted

## 2023-12-12 DIAGNOSIS — Z1231 Encounter for screening mammogram for malignant neoplasm of breast: Secondary | ICD-10-CM

## 2023-12-30 ENCOUNTER — Ambulatory Visit: Payer: Medicare HMO

## 2023-12-30 DIAGNOSIS — I5042 Chronic combined systolic (congestive) and diastolic (congestive) heart failure: Secondary | ICD-10-CM

## 2023-12-31 ENCOUNTER — Ambulatory Visit

## 2023-12-31 DIAGNOSIS — I5042 Chronic combined systolic (congestive) and diastolic (congestive) heart failure: Secondary | ICD-10-CM | POA: Diagnosis not present

## 2023-12-31 DIAGNOSIS — Z95810 Presence of automatic (implantable) cardiac defibrillator: Secondary | ICD-10-CM | POA: Diagnosis not present

## 2023-12-31 LAB — CUP PACEART REMOTE DEVICE CHECK
Battery Remaining Longevity: 74 mo
Battery Remaining Percentage: 80 %
Battery Voltage: 3.01 V
Brady Statistic AP VP Percent: 88 %
Brady Statistic AP VS Percent: 1 %
Brady Statistic AS VP Percent: 11 %
Brady Statistic AS VS Percent: 1 %
Brady Statistic RA Percent Paced: 88 %
Date Time Interrogation Session: 20251222022930
HighPow Impedance: 45 Ohm
HighPow Impedance: 45 Ohm
Implantable Lead Connection Status: 753985
Implantable Lead Connection Status: 753985
Implantable Lead Connection Status: 753985
Implantable Lead Implant Date: 20080423
Implantable Lead Implant Date: 20140918
Implantable Lead Implant Date: 20241220
Implantable Lead Location: 753858
Implantable Lead Location: 753859
Implantable Lead Location: 753860
Implantable Lead Model: 5076
Implantable Pulse Generator Implant Date: 20241220
Lead Channel Impedance Value: 440 Ohm
Lead Channel Impedance Value: 480 Ohm
Lead Channel Impedance Value: 530 Ohm
Lead Channel Pacing Threshold Amplitude: 0.5 V
Lead Channel Pacing Threshold Amplitude: 0.875 V
Lead Channel Pacing Threshold Amplitude: 1 V
Lead Channel Pacing Threshold Pulse Width: 0.4 ms
Lead Channel Pacing Threshold Pulse Width: 0.4 ms
Lead Channel Pacing Threshold Pulse Width: 0.4 ms
Lead Channel Sensing Intrinsic Amplitude: 1.3 mV
Lead Channel Sensing Intrinsic Amplitude: 9.9 mV
Lead Channel Setting Pacing Amplitude: 1.5 V
Lead Channel Setting Pacing Amplitude: 2 V
Lead Channel Setting Pacing Amplitude: 2 V
Lead Channel Setting Pacing Pulse Width: 0.4 ms
Lead Channel Setting Pacing Pulse Width: 0.4 ms
Lead Channel Setting Sensing Sensitivity: 0.5 mV
Pulse Gen Serial Number: 5567048

## 2023-12-31 NOTE — Progress Notes (Signed)
 EPIC Encounter for ICM Monitoring  Patient Name: Willa Brocks is a 73 y.o. female Date: 12/31/2023 Primary Care Physican: Cristopher Suzen HERO, NP Primary Cardiologist: Skains/Weaver PA  Electrophysiologist: Camnitz BiV Pacing: >99% 04/16/2022 Weight: 230 lbs 05/15/2022 Weight:  230 lbs 08/29/2022 Weight: 230 lbs 11/21/2022 Office Weight: 237 lbs 02/18/2023 Weight: 230 lbs  04/03/2023 Office Weight: 230 lbs 06/25/2023 Weight: 234 lbs lbs  09/18/2023 Weight: 230 lbs 11/22/2023 Office Visit: 230 lbs   AT/AF Burden: <1% (taking Eliquis )                                                            Spoke with patient and heart failure questions reviewed.  Transmission results reviewed.  Pt asymptomatic for fluid accumulation.  Reports feeling well at this time and voices no complaints.     Since 11/28/2023 ICM Remote Transmission: [employer] thoracic impedance suggesting normal fluid levels.      Prescribed: No diuretic   Labs: 04/20/2023 Creatinine 1.02, BUN 25, Potassium 4.6, Sodium 137, GFR 58  04/19/2023 Creatinine 1.01, BUN 21, Potassium 4.4, Sodium 135, GFR 59  04/18/2023 Creatinine 1.12, BUN 20, Potassium 4.3, Sodium 137, GFR 52  04/17/2023 Creatinine 1.14, BUN 18, Potassium 4.7, Sodium 134, GFR 51 04/16/2023 Creatinine 0.82, BUN 21, Potassium 4.0, Sodium 140, GFR >60  04/15/2023 Creatinine 0.79, BUN 19, Potassium 3.1, Sodium 144, GFR >60  A complete set of results can be found in Results Review.   Recommendations:  No changes and encouraged to call if experiencing any fluid symptoms.   Next ICM clinic phone appointment due:  02/03/2024.   Next 91 day remote transmission due: 03/30/2024     EP/Cardiology Office Visits:    Recall 10/24/20256 with EP APP    Recall 04/28/2024 with Dr Jeffrie.   Copy of ICM check sent to Dr. Inocencio.       Remote monitoring is medically necessary for Heart Failure Management.    Daily Thoracic Impedance ICM trend: 10/01/2023 through 12/30/2023.    12-14  Month Thoracic Impedance ICM trend:     Mitzie GORMAN Garner, RN 12/31/2023 4:21 PM

## 2024-01-01 NOTE — Progress Notes (Signed)
 Remote ICD Transmission

## 2024-01-07 ENCOUNTER — Ambulatory Visit

## 2024-01-11 ENCOUNTER — Ambulatory Visit: Payer: Self-pay | Admitting: Cardiology

## 2024-01-24 ENCOUNTER — Telehealth: Payer: Self-pay

## 2024-01-24 NOTE — Telephone Encounter (Signed)
 Spoke with patient after receiving a call from [employer].  Patient needs dexcom refill sent to [facility].  Order placed via parachute.

## 2024-01-28 ENCOUNTER — Telehealth: Payer: Self-pay

## 2024-01-28 NOTE — Telephone Encounter (Signed)
 Pt called with c/o BLE for a month or more. She states she wore boots that we gave her and when she took them off her veins popped out. She is in compression and continues to wear them and they do help with swelling she states. It is difficult to get clear answers from her. She has been scheduled to see APP this week with ABIs. She is aware of this appt.

## 2024-01-29 ENCOUNTER — Other Ambulatory Visit: Payer: Self-pay

## 2024-01-29 DIAGNOSIS — I739 Peripheral vascular disease, unspecified: Secondary | ICD-10-CM

## 2024-01-31 ENCOUNTER — Ambulatory Visit (HOSPITAL_COMMUNITY)
Admission: RE | Admit: 2024-01-31 | Discharge: 2024-01-31 | Disposition: A | Source: Ambulatory Visit | Attending: Vascular Surgery

## 2024-01-31 ENCOUNTER — Ambulatory Visit: Attending: Vascular Surgery | Admitting: Physician Assistant

## 2024-01-31 VITALS — BP 106/67 | HR 65 | Temp 97.8°F | Ht 63.0 in | Wt 230.0 lb

## 2024-01-31 DIAGNOSIS — I739 Peripheral vascular disease, unspecified: Secondary | ICD-10-CM | POA: Insufficient documentation

## 2024-01-31 LAB — VAS US ABI WITH/WO TBI

## 2024-01-31 NOTE — Progress Notes (Signed)
 "   Office Note   History of Present Illness   [name] is a 74 y.o. (1950-03-23) female who presents for PAD follow-up.  She has no prior history of lower extremity revascularization.  She does have a history of left iliac vein stenting by interventional radiology in 2017.  She returns today for follow-up.  She says is doing okay at today's office visit.  She continues to mobilize long distances in her wheelchair.  She says she still walks around her home.  For the past 9 months she endorses lower back pain and aching pain in her thighs and lower legs when she lays down.  She says that the pain is worse when she lays flat on her back versus when she lays on her side.  She is currently going through physical therapy.  She denies any symptoms that sounds like rest pain.  She denies any tissue loss.  Current Outpatient Medications  Medication Sig Dispense Refill   ACCU-CHEK GUIDE TEST test strip TEST BLOOD SUGAR THREE TIMES DAILY 300 strip 3   acetaminophen  (TYLENOL ) 500 MG tablet Take 500 mg by mouth 3 (three) times daily as needed for mild pain (pain score 1-3) or moderate pain (pain score 4-6).     amiodarone  (PACERONE ) 100 MG tablet Take 100 mg by mouth daily.     Blood Glucose Monitoring Suppl (ACCU-CHEK GUIDE ME) w/Device KIT Use to check blood sugars 1 kit 0   calcitonin, salmon, (MIACALCIN /FORTICAL) 200 UNIT/ACT nasal spray Place 1 spray into alternate nostrils daily.     Continuous Glucose Sensor (DEXCOM G7 SENSOR) MISC 1 Device by Does not apply route as directed. 9 each 3   cyanocobalamin (,VITAMIN B-12,) 1000 MCG/ML injection Inject 1,000 mcg into the muscle every 30 (thirty) days.     dapagliflozin  propanediol (FARXIGA ) 10 MG TABS tablet Take 1 tablet (10 mg total) by mouth daily. 90 tablet 3   dextrose  (GLUTOSE) 40 % GEL Take 15 g by mouth as needed for low blood sugar.     ELIQUIS  5 MG TABS tablet TAKE 1 TABLET TWICE DAILY 180 tablet 3   estradiol (ESTRACE) 0.1 MG/GM  vaginal cream Place vaginally.     ezetimibe  (ZETIA ) 10 MG tablet Take 1 tablet (10 mg total) by mouth daily. 90 tablet 3   ferrous sulfate  325 (65 FE) MG EC tablet Take 325 mg by mouth daily with breakfast.     gabapentin  (NEURONTIN ) 300 MG capsule Take 300 mg by mouth 3 (three) times daily.     glucose blood (ACCU-CHEK GUIDE) test strip Check 3 times daily 100 each 12   hydrochlorothiazide  (MICROZIDE ) 12.5 MG capsule Take 1 capsule (12.5 mg total) by mouth daily. 14 capsule 0   Insulin  Pen Needle 31G X 5 MM MISC 1 Device by Does not apply route in the morning, at noon, in the evening, and at bedtime. 400 each 3   ketoconazole (NIZORAL) 2 % cream Apply 1 Application topically daily as needed for irritation.     latanoprost  (XALATAN ) 0.005 % ophthalmic solution Place 1 drop into both eyes at bedtime.     lidocaine  (LIDODERM ) 5 % Place 1 patch onto the skin daily. Remove & Discard patch within 12 hours or as directed by MD 30 patch 0   nitroGLYCERIN  (NITROSTAT ) 0.4 MG SL tablet DISSOLVE 1 TABLET UNDER THE TONGUE EVERY 5 MINUTES AS NEEDED FOR CHEST PAIN 75 tablet 3   NOVOLOG  FLEXPEN 100 UNIT/ML FlexPen INJECT PER SLIDING SCALE AS  DIRECTED , MAX 30 UNITS PER DAY. DISCARD PEN 28 DAYS AFTER OPENING 30 mL 3   OXYGEN Inhale 2 L into the lungs continuous.     rosuvastatin  (CRESTOR ) 20 MG tablet Take 1 tablet (20 mg total) by mouth daily at 6 PM. 30 tablet 2   sacubitril -valsartan  (ENTRESTO ) 24-26 MG TAKE 1 TABLET TWICE DAILY 180 tablet 3   TRADJENTA 5 MG TABS tablet Take 5 mg by mouth daily.     TRESIBA  FLEXTOUCH 100 UNIT/ML FlexTouch Pen INJECT 30 UNITS INTO THE SKIN DAILY. 30 mL 3   TRUE METRIX BLOOD GLUCOSE TEST test strip TEST THREE TIMES DAILY 300 strip 3   Vitamin D , Ergocalciferol , (DRISDOL ) 1.25 MG (50000 UT) CAPS capsule Take 1 capsule (50,000 Units total) by mouth every 7 (seven) days. 4 capsule 0   Current Facility-Administered Medications  Medication Dose Route Frequency Provider Last Rate  Last Admin   lidocaine  (PF) (XYLOCAINE ) 1 % injection 10 mL  10 mL Infiltration Once         REVIEW OF SYSTEMS (negative unless checked):   Cardiac:  []  Chest pain or chest pressure? []  Shortness of breath upon activity? []  Shortness of breath when lying flat? []  Irregular heart rhythm?  Vascular:  []  Pain in calf, thigh, or hip brought on by walking? []  Pain in feet at night that wakes you up from your sleep? []  Blood clot in your veins? []  Leg swelling?  Pulmonary:  []  Oxygen at home? []  Productive cough? []  Wheezing?  Neurologic:  []  Sudden weakness in arms or legs? []  Sudden numbness in arms or legs? []  Sudden onset of difficult speaking or slurred speech? []  Temporary loss of vision in one eye? []  Problems with dizziness?  Gastrointestinal:  []  Blood in stool? []  Vomited blood?  Genitourinary:  []  Burning when urinating? []  Blood in urine?  Psychiatric:  []  Major depression  Hematologic:  []  Bleeding problems? []  Problems with blood clotting?  Dermatologic:  []  Rashes or ulcers?  Constitutional:  []  Fever or chills?  Ear/Nose/Throat:  []  Change in hearing? []  Nose bleeds? []  Sore throat?  Musculoskeletal:  []  Back pain? []  Joint pain? []  Muscle pain?   Physical Examination   Vitals:   01/31/24 0911  BP: 106/67  Pulse: 65  Temp: 97.8 F (36.6 C)  SpO2: 96%  Weight: 230 lb (104.3 kg)  Height: 5' 3 (1.6 m)   There is no height or weight on file to calculate BMI.  General:  WDWN in NAD; vital signs documented above Gait: Not observed HENT: WNL, normocephalic Pulmonary: normal non-labored breathing , Cardiac: regular Abdomen: soft, NT, no masses Skin: without rashes Vascular Exam/Pulses: Brisk PT Doppler signals bilaterally Extremities: without ischemic changes, without gangrene , without cellulitis; without open wounds;  Musculoskeletal: no muscle wasting or atrophy  Neurologic: A&O X 3;  No focal weakness or paresthesias are  detected Psychiatric:  The pt has normal affect  Non-Invasive Vascular imaging   ABI (01/31/2024) R:  ABI: [place] ([place]),  PT: tri DP: tri TBI:  0.56 L:  ABI: [place] ([place]),  PT: tri DP: none TBI: 0.88   Medical Decision Making   [name] is a 74 y.o. female who presents for surveillance of PAD  Based on the patient's vascular studies, her ABIs remain noncompressible bilaterally.  She has triphasic tibial vessel flow on the right.  She has triphasic flow in the PTA on the left. The patient's mobility is limited and she uses her  wheelchair for long distances.  She does however walk around her house as needed.  She denies any claudication, rest pain, or tissue loss.  For the past several months she has been dealing with worsening lower back pain and aching pain in her legs when she lays flat on her back. On exam she has no ulcerations.  Her feet are warm and well-perfused with brisk PT Doppler signals bilaterally I have discussed with the patient that I do not think her bilateral lower extremity pain is related to her arterial disease.  This does not sound like rest pain.  Her pain seems to be related to her lower back issues.  She can continue her Eliquis  and statin and follow-up with our office in 1 year with repeat ABIs  Ahmed Holster PA-C [facility] Office: [phone]  Clinic MD: [place]  "

## 2024-02-03 ENCOUNTER — Ambulatory Visit: Attending: Cardiology

## 2024-02-03 DIAGNOSIS — Z95810 Presence of automatic (implantable) cardiac defibrillator: Secondary | ICD-10-CM | POA: Diagnosis not present

## 2024-02-03 DIAGNOSIS — I5042 Chronic combined systolic (congestive) and diastolic (congestive) heart failure: Secondary | ICD-10-CM | POA: Diagnosis not present

## 2024-02-04 ENCOUNTER — Telehealth: Payer: Self-pay

## 2024-02-04 NOTE — Telephone Encounter (Signed)
 Remote ICM transmission received.  Attempted call to patient regarding ICM remote transmission and no answer.

## 2024-02-04 NOTE — Progress Notes (Signed)
 EPIC Encounter for ICM Monitoring  Patient Name: [name] is a 74 y.o. female Date: 02/04/2024 Primary Care Physican: Cristopher Suzen HERO, NP Primary Cardiologist: Skains/Weaver PA  Electrophysiologist: Camnitz BiV Pacing: >99% 04/16/2022 Weight: 230 lbs 05/15/2022 Weight:  230 lbs 08/29/2022 Weight: 230 lbs 11/21/2022 Office Weight: 237 lbs 02/18/2023 Weight: 230 lbs  04/03/2023 Office Weight: 230 lbs 06/25/2023 Weight: 234 lbs lbs  09/18/2023 Weight: 230 lbs 11/22/2023 Office Visit: 230 lbs   AT/AF Burden: <1% (taking Eliquis )                                                            Spoke with patient and heart failure questions reviewed.  Transmission results reviewed.  Pt asymptomatic for fluid accumulation.  Reports feeling well at this time and voices no complaints.       Since 12/31/2023 ICM Remote Transmission: [employer] thoracic impedance suggesting normal fluid levels.      Prescribed: No diuretic   Labs: 04/20/2023 Creatinine 1.02, BUN 25, Potassium 4.6, Sodium 137, GFR 58  04/19/2023 Creatinine 1.01, BUN 21, Potassium 4.4, Sodium 135, GFR 59  04/18/2023 Creatinine 1.12, BUN 20, Potassium 4.3, Sodium 137, GFR 52  04/17/2023 Creatinine 1.14, BUN 18, Potassium 4.7, Sodium 134, GFR 51 04/16/2023 Creatinine 0.82, BUN 21, Potassium 4.0, Sodium 140, GFR >60  04/15/2023 Creatinine 0.79, BUN 19, Potassium 3.1, Sodium 144, GFR >60  A complete set of results can be found in Results Review.   Recommendations:  Advised to call physicians office for any changes in condition.     Follow-up plan: ICM clinic 31 day follow up currently on hold but 91 day remote monitoring will continue.   Next 91 day remote transmission due: 03/30/2024.    EP/Cardiology Office Visits:    Recall 10/31/2024 with EP APP    Recall 04/28/2024 with Dr Jeffrie.   Copy of ICM check sent to Dr. Inocencio.       Remote monitoring is medically necessary for Heart Failure Management.    Daily Thoracic Impedance  ICM trend: 11/05/2023 through 02/03/2024.    12-14 Month Thoracic Impedance ICM trend:     Mitzie GORMAN Garner, RN 02/04/2024 4:16 PM

## 2024-02-12 ENCOUNTER — Other Ambulatory Visit: Payer: Self-pay | Admitting: Internal Medicine

## 2024-02-18 ENCOUNTER — Ambulatory Visit: Admitting: Podiatry

## 2024-04-29 ENCOUNTER — Inpatient Hospital Stay: Admitting: Obstetrics & Gynecology

## 2024-05-20 ENCOUNTER — Ambulatory Visit: Admitting: Internal Medicine
